# Patient Record
Sex: Male | Born: 1970 | Race: Black or African American | Hispanic: No | Marital: Single | Smoking: Never smoker
Health system: Southern US, Community
[De-identification: ages and names within clinical notes are randomized; demographics above are authoritative.]

## PROBLEM LIST (undated history)

## (undated) DIAGNOSIS — Z8639 Personal history of other endocrine, nutritional and metabolic disease: Secondary | ICD-10-CM

## (undated) DIAGNOSIS — I4891 Unspecified atrial fibrillation: Secondary | ICD-10-CM

## (undated) DIAGNOSIS — M069 Rheumatoid arthritis, unspecified: Secondary | ICD-10-CM

## (undated) DIAGNOSIS — I509 Heart failure, unspecified: Secondary | ICD-10-CM

## (undated) DIAGNOSIS — N189 Chronic kidney disease, unspecified: Secondary | ICD-10-CM

## (undated) DIAGNOSIS — Z91199 Patient's noncompliance with other medical treatment and regimen due to unspecified reason: Secondary | ICD-10-CM

## (undated) DIAGNOSIS — I10 Essential (primary) hypertension: Secondary | ICD-10-CM

## (undated) DIAGNOSIS — D649 Anemia, unspecified: Secondary | ICD-10-CM

## (undated) DIAGNOSIS — Z9119 Patient's noncompliance with other medical treatment and regimen: Secondary | ICD-10-CM

## (undated) DIAGNOSIS — K219 Gastro-esophageal reflux disease without esophagitis: Secondary | ICD-10-CM

## (undated) DIAGNOSIS — I340 Nonrheumatic mitral (valve) insufficiency: Secondary | ICD-10-CM

## (undated) DIAGNOSIS — I5022 Chronic systolic (congestive) heart failure: Secondary | ICD-10-CM

## (undated) DIAGNOSIS — K922 Gastrointestinal hemorrhage, unspecified: Secondary | ICD-10-CM

## (undated) DIAGNOSIS — I472 Ventricular tachycardia: Secondary | ICD-10-CM

## (undated) DIAGNOSIS — I4729 Other ventricular tachycardia: Secondary | ICD-10-CM

## (undated) DIAGNOSIS — I4901 Ventricular fibrillation: Secondary | ICD-10-CM

## (undated) DIAGNOSIS — N179 Acute kidney failure, unspecified: Secondary | ICD-10-CM

## (undated) DIAGNOSIS — R945 Abnormal results of liver function studies: Secondary | ICD-10-CM

## (undated) DIAGNOSIS — Z8674 Personal history of sudden cardiac arrest: Secondary | ICD-10-CM

## (undated) DIAGNOSIS — I071 Rheumatic tricuspid insufficiency: Secondary | ICD-10-CM

## (undated) DIAGNOSIS — R7989 Other specified abnormal findings of blood chemistry: Secondary | ICD-10-CM

## (undated) DIAGNOSIS — Z95810 Presence of automatic (implantable) cardiac defibrillator: Secondary | ICD-10-CM

## (undated) HISTORY — DX: Gastrointestinal hemorrhage, unspecified: K92.2

## (undated) HISTORY — DX: Personal history of sudden cardiac arrest: Z86.74

## (undated) HISTORY — PX: INSERT / REPLACE / REMOVE PACEMAKER: SUR710

## (undated) HISTORY — DX: Personal history of other endocrine, nutritional and metabolic disease: Z86.39

## (undated) HISTORY — DX: Patient's noncompliance with other medical treatment and regimen due to unspecified reason: Z91.199

## (undated) HISTORY — DX: Presence of automatic (implantable) cardiac defibrillator: Z95.810

## (undated) HISTORY — DX: Essential (primary) hypertension: I10

## (undated) HISTORY — PX: HEART TRANSPLANT: SHX268

## (undated) HISTORY — DX: Nonrheumatic mitral (valve) insufficiency: I34.0

## (undated) HISTORY — PX: CHOLECYSTECTOMY: SHX55

## (undated) HISTORY — DX: Rheumatoid arthritis, unspecified: M06.9

## (undated) HISTORY — DX: Unspecified atrial fibrillation: I48.91

## (undated) HISTORY — DX: Other specified abnormal findings of blood chemistry: R79.89

## (undated) HISTORY — DX: Other ventricular tachycardia: I47.29

## (undated) HISTORY — DX: Patient's noncompliance with other medical treatment and regimen: Z91.19

## (undated) HISTORY — DX: Ventricular tachycardia: I47.2

## (undated) HISTORY — DX: Ventricular fibrillation: I49.01

## (undated) HISTORY — DX: Rheumatic tricuspid insufficiency: I07.1

## (undated) HISTORY — DX: Heart failure, unspecified: I50.9

## (undated) HISTORY — DX: Abnormal results of liver function studies: R94.5

## (undated) HISTORY — DX: Chronic systolic (congestive) heart failure: I50.22

---

## 1997-08-18 DIAGNOSIS — Z8674 Personal history of sudden cardiac arrest: Secondary | ICD-10-CM

## 1997-08-18 HISTORY — DX: Personal history of sudden cardiac arrest: Z86.74

## 1997-08-24 ENCOUNTER — Inpatient Hospital Stay (HOSPITAL_COMMUNITY): Admission: EM | Admit: 1997-08-24 | Discharge: 1997-09-06 | Payer: Self-pay | Admitting: Emergency Medicine

## 2000-08-24 ENCOUNTER — Encounter: Payer: Self-pay | Admitting: Internal Medicine

## 2000-08-24 ENCOUNTER — Ambulatory Visit (HOSPITAL_COMMUNITY): Admission: RE | Admit: 2000-08-24 | Discharge: 2000-08-24 | Payer: Self-pay | Admitting: Internal Medicine

## 2004-03-12 ENCOUNTER — Ambulatory Visit: Payer: Self-pay | Admitting: Cardiology

## 2004-06-16 ENCOUNTER — Ambulatory Visit: Payer: Self-pay | Admitting: Internal Medicine

## 2004-06-25 ENCOUNTER — Ambulatory Visit: Payer: Self-pay | Admitting: Internal Medicine

## 2004-07-02 ENCOUNTER — Ambulatory Visit: Payer: Self-pay

## 2004-07-02 ENCOUNTER — Ambulatory Visit: Payer: Self-pay | Admitting: Cardiology

## 2004-07-02 ENCOUNTER — Ambulatory Visit: Payer: Self-pay | Admitting: *Deleted

## 2004-07-04 ENCOUNTER — Ambulatory Visit: Payer: Self-pay | Admitting: Internal Medicine

## 2004-07-09 ENCOUNTER — Ambulatory Visit: Payer: Self-pay | Admitting: Internal Medicine

## 2004-07-09 ENCOUNTER — Ambulatory Visit (HOSPITAL_COMMUNITY): Admission: RE | Admit: 2004-07-09 | Discharge: 2004-07-09 | Payer: Self-pay | Admitting: Internal Medicine

## 2004-07-11 ENCOUNTER — Ambulatory Visit: Payer: Self-pay | Admitting: Cardiology

## 2004-07-23 ENCOUNTER — Ambulatory Visit: Payer: Self-pay | Admitting: Internal Medicine

## 2004-07-23 ENCOUNTER — Ambulatory Visit: Payer: Self-pay

## 2004-08-04 ENCOUNTER — Ambulatory Visit: Payer: Self-pay

## 2004-08-11 ENCOUNTER — Ambulatory Visit: Payer: Self-pay | Admitting: Cardiology

## 2004-08-25 ENCOUNTER — Ambulatory Visit: Payer: Self-pay | Admitting: *Deleted

## 2004-09-11 ENCOUNTER — Ambulatory Visit: Payer: Self-pay | Admitting: Cardiology

## 2004-10-09 ENCOUNTER — Ambulatory Visit: Payer: Self-pay | Admitting: Cardiology

## 2005-01-14 ENCOUNTER — Ambulatory Visit: Payer: Self-pay | Admitting: Cardiology

## 2005-02-12 ENCOUNTER — Ambulatory Visit: Payer: Self-pay | Admitting: Cardiology

## 2005-02-23 ENCOUNTER — Ambulatory Visit: Payer: Self-pay | Admitting: Internal Medicine

## 2005-04-03 ENCOUNTER — Ambulatory Visit: Payer: Self-pay | Admitting: Cardiology

## 2006-03-26 ENCOUNTER — Emergency Department (HOSPITAL_COMMUNITY): Admission: EM | Admit: 2006-03-26 | Discharge: 2006-03-26 | Payer: Self-pay | Admitting: Emergency Medicine

## 2006-04-05 ENCOUNTER — Ambulatory Visit: Payer: Self-pay | Admitting: Cardiology

## 2006-05-10 ENCOUNTER — Ambulatory Visit: Payer: Self-pay | Admitting: Cardiology

## 2006-06-03 ENCOUNTER — Ambulatory Visit: Payer: Self-pay | Admitting: Internal Medicine

## 2006-06-15 ENCOUNTER — Ambulatory Visit: Payer: Self-pay | Admitting: Cardiology

## 2006-06-19 HISTORY — PX: CARDIAC CATHETERIZATION: SHX172

## 2006-06-30 ENCOUNTER — Ambulatory Visit: Payer: Self-pay | Admitting: Cardiology

## 2006-07-17 ENCOUNTER — Inpatient Hospital Stay (HOSPITAL_COMMUNITY): Admission: EM | Admit: 2006-07-17 | Discharge: 2006-07-25 | Payer: Self-pay | Admitting: *Deleted

## 2006-07-19 ENCOUNTER — Encounter: Payer: Self-pay | Admitting: Internal Medicine

## 2006-07-19 ENCOUNTER — Ambulatory Visit: Payer: Self-pay | Admitting: Internal Medicine

## 2006-07-28 ENCOUNTER — Ambulatory Visit: Payer: Self-pay | Admitting: Internal Medicine

## 2006-08-06 ENCOUNTER — Ambulatory Visit: Payer: Self-pay | Admitting: Cardiology

## 2006-08-12 ENCOUNTER — Ambulatory Visit: Payer: Self-pay | Admitting: Cardiology

## 2006-08-16 ENCOUNTER — Ambulatory Visit: Payer: Self-pay | Admitting: Internal Medicine

## 2006-08-19 ENCOUNTER — Ambulatory Visit: Payer: Self-pay | Admitting: Cardiovascular Disease

## 2006-08-20 ENCOUNTER — Ambulatory Visit: Payer: Self-pay

## 2006-08-26 ENCOUNTER — Ambulatory Visit: Payer: Self-pay | Admitting: Cardiology

## 2006-09-17 ENCOUNTER — Ambulatory Visit: Payer: Self-pay | Admitting: Cardiology

## 2006-09-27 ENCOUNTER — Ambulatory Visit: Payer: Self-pay | Admitting: Internal Medicine

## 2006-10-07 ENCOUNTER — Ambulatory Visit: Payer: Self-pay | Admitting: Internal Medicine

## 2006-10-07 LAB — CONVERTED CEMR LAB
CO2: 28 meq/L (ref 19–32)
Calcium: 8.7 mg/dL (ref 8.4–10.5)
Chloride: 109 meq/L (ref 96–112)
Creatinine, Ser: 1.4 mg/dL (ref 0.4–1.5)
Glucose, Bld: 95 mg/dL (ref 70–99)
Sodium: 142 meq/L (ref 135–145)

## 2006-10-25 ENCOUNTER — Ambulatory Visit: Payer: Self-pay | Admitting: Cardiovascular Disease

## 2006-11-22 ENCOUNTER — Ambulatory Visit: Payer: Self-pay | Admitting: Internal Medicine

## 2007-01-03 ENCOUNTER — Ambulatory Visit: Payer: Self-pay | Admitting: Internal Medicine

## 2007-01-03 ENCOUNTER — Ambulatory Visit: Payer: Self-pay

## 2007-01-03 LAB — CONVERTED CEMR LAB
BUN: 13 mg/dL (ref 6–23)
Calcium: 8.8 mg/dL (ref 8.4–10.5)
Creatinine, Ser: 1.1 mg/dL (ref 0.4–1.5)
GFR calc Af Amer: 96 mL/min
GFR calc non Af Amer: 80 mL/min
Glucose, Bld: 104 mg/dL — ABNORMAL HIGH (ref 70–99)
Potassium: 3.5 meq/L (ref 3.5–5.1)
Prothrombin Time: 26.8 s — ABNORMAL HIGH (ref 10.9–13.3)
Sodium: 143 meq/L (ref 135–145)
TSH: 2.21 microintl units/mL (ref 0.35–5.50)

## 2007-01-12 ENCOUNTER — Ambulatory Visit: Payer: Self-pay

## 2007-01-12 ENCOUNTER — Encounter: Payer: Self-pay | Admitting: Internal Medicine

## 2007-01-12 ENCOUNTER — Ambulatory Visit (HOSPITAL_COMMUNITY): Admission: RE | Admit: 2007-01-12 | Discharge: 2007-01-12 | Payer: Self-pay | Admitting: Internal Medicine

## 2007-01-12 ENCOUNTER — Ambulatory Visit: Payer: Self-pay | Admitting: Internal Medicine

## 2007-01-14 ENCOUNTER — Ambulatory Visit: Payer: Self-pay | Admitting: Internal Medicine

## 2007-01-14 ENCOUNTER — Ambulatory Visit: Payer: Self-pay | Admitting: Cardiovascular Disease

## 2007-01-19 ENCOUNTER — Ambulatory Visit: Payer: Self-pay | Admitting: Internal Medicine

## 2007-01-21 ENCOUNTER — Ambulatory Visit: Payer: Self-pay | Admitting: Internal Medicine

## 2007-01-21 ENCOUNTER — Ambulatory Visit (HOSPITAL_COMMUNITY): Admission: RE | Admit: 2007-01-21 | Discharge: 2007-01-21 | Payer: Self-pay | Admitting: Internal Medicine

## 2007-01-27 ENCOUNTER — Ambulatory Visit: Payer: Self-pay | Admitting: Internal Medicine

## 2007-01-27 LAB — CONVERTED CEMR LAB
BUN: 16 mg/dL (ref 6–23)
CO2: 28 meq/L (ref 19–32)
Calcium: 8.6 mg/dL (ref 8.4–10.5)
GFR calc Af Amer: 79 mL/min

## 2007-01-28 ENCOUNTER — Ambulatory Visit: Payer: Self-pay | Admitting: Cardiology

## 2007-01-28 ENCOUNTER — Inpatient Hospital Stay (HOSPITAL_COMMUNITY): Admission: AD | Admit: 2007-01-28 | Discharge: 2007-02-03 | Payer: Self-pay | Admitting: Internal Medicine

## 2007-02-18 ENCOUNTER — Ambulatory Visit: Payer: Self-pay | Admitting: Cardiology

## 2007-03-04 ENCOUNTER — Ambulatory Visit: Payer: Self-pay | Admitting: Cardiology

## 2007-03-10 ENCOUNTER — Ambulatory Visit: Payer: Self-pay | Admitting: Cardiology

## 2007-03-14 ENCOUNTER — Ambulatory Visit: Payer: Self-pay

## 2007-03-16 ENCOUNTER — Ambulatory Visit: Payer: Self-pay | Admitting: Cardiology

## 2007-04-08 ENCOUNTER — Ambulatory Visit: Payer: Self-pay | Admitting: Cardiology

## 2007-04-29 ENCOUNTER — Encounter: Payer: Self-pay | Admitting: Internal Medicine

## 2007-04-29 ENCOUNTER — Ambulatory Visit: Payer: Self-pay | Admitting: Cardiology

## 2007-04-29 ENCOUNTER — Ambulatory Visit: Payer: Self-pay

## 2007-05-02 ENCOUNTER — Ambulatory Visit: Payer: Self-pay | Admitting: Internal Medicine

## 2007-05-02 LAB — CONVERTED CEMR LAB
BUN: 22 mg/dL (ref 6–23)
CO2: 29 meq/L (ref 19–32)
Calcium: 9.5 mg/dL (ref 8.4–10.5)
GFR calc Af Amer: 87 mL/min
GFR calc non Af Amer: 72 mL/min

## 2007-05-27 ENCOUNTER — Ambulatory Visit: Payer: Self-pay | Admitting: Cardiology

## 2007-06-09 ENCOUNTER — Ambulatory Visit: Payer: Self-pay | Admitting: Cardiology

## 2007-07-20 ENCOUNTER — Ambulatory Visit: Payer: Self-pay | Admitting: Internal Medicine

## 2007-08-05 ENCOUNTER — Ambulatory Visit: Payer: Self-pay | Admitting: Internal Medicine

## 2007-08-18 ENCOUNTER — Ambulatory Visit: Payer: Self-pay | Admitting: Cardiology

## 2007-08-25 ENCOUNTER — Ambulatory Visit: Payer: Self-pay

## 2007-11-03 ENCOUNTER — Ambulatory Visit: Payer: Self-pay | Admitting: Cardiovascular Disease

## 2007-11-03 ENCOUNTER — Ambulatory Visit: Payer: Self-pay | Admitting: Internal Medicine

## 2007-11-03 LAB — CONVERTED CEMR LAB
GFR calc Af Amer: 67 mL/min
GFR calc non Af Amer: 55 mL/min
Magnesium: 2.6 mg/dL — ABNORMAL HIGH (ref 1.5–2.5)
Potassium: 4.3 meq/L (ref 3.5–5.1)
Sodium: 135 meq/L (ref 135–145)

## 2007-11-25 ENCOUNTER — Ambulatory Visit: Payer: Self-pay | Admitting: Cardiology

## 2007-12-16 ENCOUNTER — Ambulatory Visit: Payer: Self-pay | Admitting: Cardiology

## 2008-01-16 ENCOUNTER — Ambulatory Visit: Payer: Self-pay | Admitting: Cardiovascular Disease

## 2008-02-06 ENCOUNTER — Ambulatory Visit: Payer: Self-pay

## 2008-02-06 ENCOUNTER — Ambulatory Visit: Payer: Self-pay | Admitting: Internal Medicine

## 2008-03-05 ENCOUNTER — Ambulatory Visit: Payer: Self-pay | Admitting: Cardiology

## 2008-04-02 ENCOUNTER — Ambulatory Visit: Payer: Self-pay | Admitting: Cardiology

## 2008-04-30 ENCOUNTER — Ambulatory Visit: Payer: Self-pay | Admitting: Cardiology

## 2008-05-03 ENCOUNTER — Ambulatory Visit: Payer: Self-pay

## 2008-05-22 ENCOUNTER — Encounter: Payer: Self-pay | Admitting: Internal Medicine

## 2008-05-28 ENCOUNTER — Ambulatory Visit: Payer: Self-pay | Admitting: Cardiology

## 2008-06-11 ENCOUNTER — Ambulatory Visit: Payer: Self-pay | Admitting: Cardiology

## 2008-07-02 ENCOUNTER — Ambulatory Visit: Payer: Self-pay | Admitting: Cardiology

## 2008-07-23 ENCOUNTER — Ambulatory Visit: Payer: Self-pay | Admitting: Cardiology

## 2008-07-26 ENCOUNTER — Ambulatory Visit: Payer: Self-pay

## 2008-08-07 ENCOUNTER — Telehealth: Payer: Self-pay | Admitting: Internal Medicine

## 2008-08-20 ENCOUNTER — Ambulatory Visit: Payer: Self-pay | Admitting: Cardiology

## 2008-09-18 ENCOUNTER — Telehealth (INDEPENDENT_AMBULATORY_CARE_PROVIDER_SITE_OTHER): Payer: Self-pay | Admitting: *Deleted

## 2008-09-18 ENCOUNTER — Ambulatory Visit: Payer: Self-pay | Admitting: Cardiology

## 2008-09-18 LAB — CONVERTED CEMR LAB
POC INR: 2.7
Protime: 19.9

## 2008-09-19 ENCOUNTER — Encounter: Payer: Self-pay | Admitting: *Deleted

## 2008-10-16 ENCOUNTER — Ambulatory Visit: Payer: Self-pay | Admitting: Internal Medicine

## 2008-10-23 ENCOUNTER — Ambulatory Visit: Payer: Self-pay | Admitting: Internal Medicine

## 2008-10-23 DIAGNOSIS — I5022 Chronic systolic (congestive) heart failure: Secondary | ICD-10-CM | POA: Insufficient documentation

## 2008-10-23 DIAGNOSIS — I4891 Unspecified atrial fibrillation: Secondary | ICD-10-CM | POA: Insufficient documentation

## 2008-10-24 ENCOUNTER — Encounter: Payer: Self-pay | Admitting: *Deleted

## 2008-10-26 ENCOUNTER — Telehealth (INDEPENDENT_AMBULATORY_CARE_PROVIDER_SITE_OTHER): Payer: Self-pay | Admitting: *Deleted

## 2008-11-13 ENCOUNTER — Ambulatory Visit: Payer: Self-pay | Admitting: Internal Medicine

## 2008-11-15 ENCOUNTER — Ambulatory Visit: Payer: Self-pay | Admitting: Cardiovascular Disease

## 2008-11-15 LAB — CONVERTED CEMR LAB
BUN: 37 mg/dL — ABNORMAL HIGH (ref 6–23)
Creatinine, Ser: 1.9 mg/dL — ABNORMAL HIGH (ref 0.4–1.5)
GFR calc non Af Amer: 50.65 mL/min (ref >60)
Prothrombin Time: 19.5 s

## 2008-11-21 ENCOUNTER — Ambulatory Visit: Payer: Self-pay | Admitting: Internal Medicine

## 2008-11-21 DIAGNOSIS — I10 Essential (primary) hypertension: Secondary | ICD-10-CM | POA: Insufficient documentation

## 2008-11-22 LAB — CONVERTED CEMR LAB
BUN: 24 mg/dL — ABNORMAL HIGH (ref 6–23)
Chloride: 106 meq/L (ref 96–112)
Creatinine, Ser: 1.5 mg/dL (ref 0.4–1.5)
GFR calc non Af Amer: 66.53 mL/min (ref >60)
Glucose, Bld: 89 mg/dL (ref 70–99)

## 2008-12-13 ENCOUNTER — Ambulatory Visit: Payer: Self-pay | Admitting: Cardiovascular Disease

## 2008-12-13 LAB — CONVERTED CEMR LAB: POC INR: 3.5

## 2009-01-10 ENCOUNTER — Ambulatory Visit: Payer: Self-pay | Admitting: Internal Medicine

## 2009-01-21 ENCOUNTER — Encounter: Payer: Self-pay | Admitting: Internal Medicine

## 2009-01-21 ENCOUNTER — Ambulatory Visit: Payer: Self-pay

## 2009-01-24 ENCOUNTER — Ambulatory Visit: Payer: Self-pay | Admitting: Internal Medicine

## 2009-02-04 ENCOUNTER — Ambulatory Visit: Payer: Self-pay | Admitting: Cardiology

## 2009-02-04 ENCOUNTER — Inpatient Hospital Stay (HOSPITAL_COMMUNITY): Admission: EM | Admit: 2009-02-04 | Discharge: 2009-02-07 | Payer: Self-pay | Admitting: Emergency Medicine

## 2009-02-12 ENCOUNTER — Ambulatory Visit: Payer: Self-pay | Admitting: Cardiovascular Disease

## 2009-02-22 ENCOUNTER — Encounter: Payer: Self-pay | Admitting: Internal Medicine

## 2009-02-25 ENCOUNTER — Ambulatory Visit: Payer: Self-pay | Admitting: Internal Medicine

## 2009-02-25 LAB — CONVERTED CEMR LAB: POC INR: 5.3

## 2009-02-26 ENCOUNTER — Encounter: Payer: Self-pay | Admitting: Internal Medicine

## 2009-02-27 ENCOUNTER — Ambulatory Visit: Payer: Self-pay | Admitting: Internal Medicine

## 2009-03-01 ENCOUNTER — Telehealth (INDEPENDENT_AMBULATORY_CARE_PROVIDER_SITE_OTHER): Payer: Self-pay | Admitting: *Deleted

## 2009-03-06 ENCOUNTER — Telehealth (INDEPENDENT_AMBULATORY_CARE_PROVIDER_SITE_OTHER): Payer: Self-pay | Admitting: *Deleted

## 2009-03-07 ENCOUNTER — Encounter: Payer: Self-pay | Admitting: Internal Medicine

## 2009-03-08 ENCOUNTER — Ambulatory Visit: Payer: Self-pay | Admitting: Cardiology

## 2009-03-13 ENCOUNTER — Encounter: Payer: Self-pay | Admitting: Internal Medicine

## 2009-03-13 ENCOUNTER — Ambulatory Visit: Payer: Self-pay | Admitting: Cardiology

## 2009-03-13 ENCOUNTER — Telehealth (INDEPENDENT_AMBULATORY_CARE_PROVIDER_SITE_OTHER): Payer: Self-pay | Admitting: *Deleted

## 2009-03-13 LAB — CONVERTED CEMR LAB: POC INR: 2.7

## 2009-03-21 ENCOUNTER — Ambulatory Visit: Payer: Self-pay | Admitting: Internal Medicine

## 2009-03-21 ENCOUNTER — Ambulatory Visit: Payer: Self-pay | Admitting: Cardiovascular Disease

## 2009-03-21 LAB — CONVERTED CEMR LAB: POC INR: 2.8

## 2009-03-22 LAB — CONVERTED CEMR LAB
AST: 18 units/L (ref 0–37)
Basophils Relative: 0.8 % (ref 0.0–3.0)
CO2: 32 meq/L (ref 19–32)
Chloride: 104 meq/L (ref 96–112)
Creatinine, Ser: 1.5 mg/dL (ref 0.4–1.5)
Eosinophils Relative: 1.1 % (ref 0.0–5.0)
HCT: 40.8 % (ref 39.0–52.0)
Hemoglobin: 12.8 g/dL — ABNORMAL LOW (ref 13.0–17.0)
MCHC: 31.2 g/dL (ref 30.0–36.0)
MCV: 69.5 fL — ABNORMAL LOW (ref 78.0–100.0)
Neutrophils Relative %: 52.5 % (ref 43.0–77.0)
Potassium: 3.7 meq/L (ref 3.5–5.1)
RBC: 5.88 M/uL — ABNORMAL HIGH (ref 4.22–5.81)
TSH: 2.45 microintl units/mL (ref 0.35–5.50)
Total Bilirubin: 0.7 mg/dL (ref 0.3–1.2)
WBC: 6.6 10*3/uL (ref 4.5–10.5)

## 2009-03-27 ENCOUNTER — Ambulatory Visit (HOSPITAL_COMMUNITY): Admission: RE | Admit: 2009-03-27 | Discharge: 2009-03-27 | Payer: Self-pay | Admitting: Cardiology

## 2009-03-27 ENCOUNTER — Ambulatory Visit: Payer: Self-pay | Admitting: Cardiology

## 2009-04-11 ENCOUNTER — Ambulatory Visit: Payer: Self-pay | Admitting: Internal Medicine

## 2009-04-26 ENCOUNTER — Ambulatory Visit (HOSPITAL_COMMUNITY): Admission: RE | Admit: 2009-04-26 | Discharge: 2009-04-26 | Payer: Self-pay | Admitting: Internal Medicine

## 2009-04-26 ENCOUNTER — Ambulatory Visit: Payer: Self-pay | Admitting: Cardiology

## 2009-04-26 ENCOUNTER — Ambulatory Visit: Payer: Self-pay | Admitting: Cardiovascular Disease

## 2009-04-26 ENCOUNTER — Encounter: Payer: Self-pay | Admitting: Internal Medicine

## 2009-04-26 ENCOUNTER — Ambulatory Visit: Payer: Self-pay

## 2009-04-26 LAB — CONVERTED CEMR LAB: POC INR: 3

## 2009-05-01 ENCOUNTER — Telehealth (INDEPENDENT_AMBULATORY_CARE_PROVIDER_SITE_OTHER): Payer: Self-pay | Admitting: *Deleted

## 2009-05-14 ENCOUNTER — Ambulatory Visit: Payer: Self-pay | Admitting: Cardiovascular Disease

## 2009-05-14 ENCOUNTER — Ambulatory Visit: Payer: Self-pay | Admitting: Internal Medicine

## 2009-05-14 LAB — CONVERTED CEMR LAB: POC INR: 3

## 2009-05-15 ENCOUNTER — Encounter: Payer: Self-pay | Admitting: Internal Medicine

## 2009-05-17 ENCOUNTER — Telehealth: Payer: Self-pay | Admitting: Internal Medicine

## 2009-05-22 ENCOUNTER — Telehealth (INDEPENDENT_AMBULATORY_CARE_PROVIDER_SITE_OTHER): Payer: Self-pay | Admitting: *Deleted

## 2009-05-22 ENCOUNTER — Ambulatory Visit: Payer: Self-pay | Admitting: Internal Medicine

## 2009-05-24 LAB — CONVERTED CEMR LAB
Basophils Relative: 0.2 % (ref 0.0–3.0)
CO2: 27 meq/L (ref 19–32)
Chloride: 107 meq/L (ref 96–112)
Creatinine, Ser: 1.5 mg/dL (ref 0.4–1.5)
Eosinophils Absolute: 0.1 10*3/uL (ref 0.0–0.7)
Eosinophils Relative: 1.8 % (ref 0.0–5.0)
Hemoglobin: 11.2 g/dL — ABNORMAL LOW (ref 13.0–17.0)
INR: 2.6 — ABNORMAL HIGH (ref 0.8–1.0)
Lymphocytes Relative: 18.1 % (ref 12.0–46.0)
MCHC: 30.1 g/dL (ref 30.0–36.0)
MCV: 66.4 fL — ABNORMAL LOW (ref 78.0–100.0)
Monocytes Absolute: 0.6 10*3/uL (ref 0.1–1.0)
Neutro Abs: 5.3 10*3/uL (ref 1.4–7.7)
Neutrophils Relative %: 72.3 % (ref 43.0–77.0)
RBC: 5.61 M/uL (ref 4.22–5.81)
Sodium: 140 meq/L (ref 135–145)
WBC: 7.3 10*3/uL (ref 4.5–10.5)
aPTT: 59.3 s — ABNORMAL HIGH (ref 21.7–28.8)

## 2009-05-27 ENCOUNTER — Ambulatory Visit (HOSPITAL_COMMUNITY): Admission: RE | Admit: 2009-05-27 | Discharge: 2009-05-27 | Payer: Self-pay | Admitting: Internal Medicine

## 2009-05-27 ENCOUNTER — Ambulatory Visit: Payer: Self-pay | Admitting: Internal Medicine

## 2009-06-04 ENCOUNTER — Ambulatory Visit: Payer: Self-pay | Admitting: Internal Medicine

## 2009-06-04 ENCOUNTER — Encounter (INDEPENDENT_AMBULATORY_CARE_PROVIDER_SITE_OTHER): Payer: Self-pay | Admitting: Cardiology

## 2009-06-25 ENCOUNTER — Ambulatory Visit: Payer: Self-pay | Admitting: Cardiology

## 2009-06-25 LAB — CONVERTED CEMR LAB: POC INR: 2.9

## 2009-07-23 ENCOUNTER — Ambulatory Visit: Payer: Self-pay | Admitting: Internal Medicine

## 2009-07-23 LAB — CONVERTED CEMR LAB: POC INR: 1.5

## 2009-08-06 ENCOUNTER — Ambulatory Visit: Payer: Self-pay | Admitting: Cardiovascular Disease

## 2009-08-06 LAB — CONVERTED CEMR LAB: POC INR: 1.9

## 2009-08-27 ENCOUNTER — Ambulatory Visit: Payer: Self-pay | Admitting: Cardiovascular Disease

## 2009-09-06 ENCOUNTER — Telehealth: Payer: Self-pay | Admitting: Internal Medicine

## 2009-09-24 ENCOUNTER — Ambulatory Visit: Payer: Self-pay | Admitting: Cardiology

## 2009-10-23 ENCOUNTER — Ambulatory Visit: Payer: Self-pay | Admitting: Internal Medicine

## 2009-10-23 ENCOUNTER — Ambulatory Visit: Payer: Self-pay

## 2009-10-23 LAB — CONVERTED CEMR LAB: POC INR: 2.6

## 2009-11-20 ENCOUNTER — Ambulatory Visit: Payer: Self-pay | Admitting: Cardiology

## 2009-12-18 ENCOUNTER — Ambulatory Visit: Payer: Self-pay | Admitting: Cardiology

## 2010-01-13 ENCOUNTER — Telehealth: Payer: Self-pay | Admitting: Internal Medicine

## 2010-01-15 ENCOUNTER — Ambulatory Visit: Payer: Self-pay | Admitting: Internal Medicine

## 2010-01-15 LAB — CONVERTED CEMR LAB: POC INR: 1.9

## 2010-02-10 ENCOUNTER — Encounter: Payer: Self-pay | Admitting: Internal Medicine

## 2010-02-11 ENCOUNTER — Ambulatory Visit: Payer: Self-pay | Admitting: Cardiology

## 2010-02-11 ENCOUNTER — Ambulatory Visit: Payer: Self-pay | Admitting: Internal Medicine

## 2010-02-11 LAB — CONVERTED CEMR LAB: POC INR: 2.3

## 2010-02-18 ENCOUNTER — Telehealth (INDEPENDENT_AMBULATORY_CARE_PROVIDER_SITE_OTHER): Payer: Self-pay | Admitting: *Deleted

## 2010-03-11 ENCOUNTER — Ambulatory Visit: Payer: Self-pay | Admitting: Cardiovascular Disease

## 2010-03-11 ENCOUNTER — Telehealth: Payer: Self-pay | Admitting: Internal Medicine

## 2010-03-26 ENCOUNTER — Ambulatory Visit: Payer: Self-pay | Admitting: Internal Medicine

## 2010-04-08 ENCOUNTER — Ambulatory Visit: Payer: Self-pay

## 2010-04-30 ENCOUNTER — Ambulatory Visit: Admission: RE | Admit: 2010-04-30 | Discharge: 2010-04-30 | Payer: Self-pay | Source: Home / Self Care

## 2010-04-30 ENCOUNTER — Ambulatory Visit
Admission: RE | Admit: 2010-04-30 | Discharge: 2010-04-30 | Payer: Self-pay | Source: Home / Self Care | Attending: Internal Medicine | Admitting: Internal Medicine

## 2010-04-30 ENCOUNTER — Other Ambulatory Visit: Payer: Self-pay | Admitting: Internal Medicine

## 2010-05-01 LAB — BASIC METABOLIC PANEL
BUN: 17 mg/dL (ref 6–23)
CO2: 28 mEq/L (ref 19–32)
Calcium: 8.5 mg/dL (ref 8.4–10.5)
Chloride: 103 mEq/L (ref 96–112)
Creatinine, Ser: 1.7 mg/dL — ABNORMAL HIGH (ref 0.4–1.5)
GFR: 56.03 mL/min — ABNORMAL LOW (ref >60.00)
Glucose, Bld: 80 mg/dL (ref 70–99)
Potassium: 3.8 mEq/L (ref 3.5–5.1)
Sodium: 136 mEq/L (ref 135–145)

## 2010-05-14 ENCOUNTER — Inpatient Hospital Stay (HOSPITAL_COMMUNITY)
Admission: EM | Admit: 2010-05-14 | Discharge: 2010-05-22 | DRG: 377 | Disposition: A | Discharge status: Other Acute Inpatient Hospital | Payer: 59 | Attending: Internal Medicine | Admitting: Internal Medicine

## 2010-05-14 DIAGNOSIS — I079 Rheumatic tricuspid valve disease, unspecified: Secondary | ICD-10-CM | POA: Diagnosis present

## 2010-05-14 DIAGNOSIS — I5022 Chronic systolic (congestive) heart failure: Secondary | ICD-10-CM | POA: Diagnosis present

## 2010-05-14 DIAGNOSIS — R578 Other shock: Secondary | ICD-10-CM | POA: Diagnosis present

## 2010-05-14 DIAGNOSIS — N179 Acute kidney failure, unspecified: Secondary | ICD-10-CM | POA: Diagnosis present

## 2010-05-14 DIAGNOSIS — K644 Residual hemorrhoidal skin tags: Secondary | ICD-10-CM | POA: Diagnosis present

## 2010-05-14 DIAGNOSIS — M109 Gout, unspecified: Secondary | ICD-10-CM | POA: Diagnosis present

## 2010-05-14 DIAGNOSIS — N189 Chronic kidney disease, unspecified: Secondary | ICD-10-CM | POA: Diagnosis present

## 2010-05-14 DIAGNOSIS — I059 Rheumatic mitral valve disease, unspecified: Secondary | ICD-10-CM | POA: Diagnosis present

## 2010-05-14 DIAGNOSIS — D62 Acute posthemorrhagic anemia: Secondary | ICD-10-CM

## 2010-05-14 DIAGNOSIS — E875 Hyperkalemia: Secondary | ICD-10-CM | POA: Diagnosis present

## 2010-05-14 DIAGNOSIS — K921 Melena: Principal | ICD-10-CM | POA: Diagnosis present

## 2010-05-14 DIAGNOSIS — I428 Other cardiomyopathies: Secondary | ICD-10-CM | POA: Diagnosis present

## 2010-05-14 DIAGNOSIS — K922 Gastrointestinal hemorrhage, unspecified: Secondary | ICD-10-CM

## 2010-05-14 DIAGNOSIS — T45515A Adverse effect of anticoagulants, initial encounter: Secondary | ICD-10-CM | POA: Diagnosis present

## 2010-05-14 DIAGNOSIS — K5730 Diverticulosis of large intestine without perforation or abscess without bleeding: Secondary | ICD-10-CM | POA: Diagnosis present

## 2010-05-14 DIAGNOSIS — K648 Other hemorrhoids: Secondary | ICD-10-CM | POA: Diagnosis present

## 2010-05-14 DIAGNOSIS — I4891 Unspecified atrial fibrillation: Secondary | ICD-10-CM | POA: Diagnosis present

## 2010-05-14 DIAGNOSIS — Z95810 Presence of automatic (implantable) cardiac defibrillator: Secondary | ICD-10-CM

## 2010-05-14 DIAGNOSIS — I509 Heart failure, unspecified: Secondary | ICD-10-CM | POA: Diagnosis present

## 2010-05-14 DIAGNOSIS — I10 Essential (primary) hypertension: Secondary | ICD-10-CM | POA: Diagnosis present

## 2010-05-14 DIAGNOSIS — R791 Abnormal coagulation profile: Secondary | ICD-10-CM | POA: Diagnosis present

## 2010-05-14 LAB — OCCULT BLOOD, POC DEVICE: Fecal Occult Bld: POSITIVE

## 2010-05-14 LAB — COMPREHENSIVE METABOLIC PANEL
Alkaline Phosphatase: 55 U/L (ref 39–117)
BUN: 37 mg/dL — ABNORMAL HIGH (ref 6–23)
Chloride: 108 mEq/L (ref 96–112)
Creatinine, Ser: 3.27 mg/dL — ABNORMAL HIGH (ref 0.4–1.5)
Glucose, Bld: 113 mg/dL — ABNORMAL HIGH (ref 70–99)
Potassium: 5.3 mEq/L — ABNORMAL HIGH (ref 3.5–5.1)
Total Bilirubin: 0.2 mg/dL — ABNORMAL LOW (ref 0.3–1.2)

## 2010-05-14 LAB — DIFFERENTIAL
Basophils Absolute: 0 10*3/uL (ref 0.0–0.1)
Lymphs Abs: 1.8 10*3/uL (ref 0.7–4.0)
Monocytes Absolute: 1 10*3/uL (ref 0.1–1.0)
Monocytes Relative: 9 % (ref 3–12)
Neutro Abs: 7.9 10*3/uL — ABNORMAL HIGH (ref 1.7–7.7)

## 2010-05-14 LAB — CBC
HCT: 15.8 % — ABNORMAL LOW (ref 39.0–52.0)
Hemoglobin: 4.8 g/dL — CL (ref 13.0–17.0)
MCH: 19.4 pg — ABNORMAL LOW (ref 26.0–34.0)
MCHC: 30.4 g/dL (ref 30.0–36.0)
RDW: 16.9 % — ABNORMAL HIGH (ref 11.5–15.5)

## 2010-05-14 LAB — CK TOTAL AND CKMB (NOT AT ARMC)
CK, MB: 0.8 ng/mL (ref 0.3–4.0)
Total CK: 110 U/L (ref 7–232)

## 2010-05-14 LAB — PROTIME-INR: Prothrombin Time: 25.3 seconds — ABNORMAL HIGH (ref 11.6–15.2)

## 2010-05-15 ENCOUNTER — Encounter: Payer: Self-pay | Admitting: Internal Medicine

## 2010-05-15 LAB — CBC
HCT: 21.8 % — ABNORMAL LOW (ref 39.0–52.0)
HCT: 23.3 % — ABNORMAL LOW (ref 39.0–52.0)
Hemoglobin: 7.2 g/dL — ABNORMAL LOW (ref 13.0–17.0)
Hemoglobin: 7.6 g/dL — ABNORMAL LOW (ref 13.0–17.0)
MCH: 24.9 pg — ABNORMAL LOW (ref 26.0–34.0)
MCH: 25.1 pg — ABNORMAL LOW (ref 26.0–34.0)
MCHC: 33 g/dL (ref 30.0–36.0)
MCHC: 32.6 g/dL (ref 30.0–36.0)
MCV: 75.4 fL — ABNORMAL LOW (ref 78.0–100.0)

## 2010-05-15 LAB — BASIC METABOLIC PANEL
Calcium: 8.1 mg/dL — ABNORMAL LOW (ref 8.4–10.5)
GFR calc non Af Amer: 31 mL/min — ABNORMAL LOW (ref >60)
Glucose, Bld: 120 mg/dL — ABNORMAL HIGH (ref 70–99)
Sodium: 141 mEq/L (ref 135–145)

## 2010-05-15 LAB — PROTIME-INR
INR: 1.69 — ABNORMAL HIGH (ref 0.00–1.49)
Prothrombin Time: 18.9 seconds — ABNORMAL HIGH (ref 11.6–15.2)

## 2010-05-15 LAB — PREPARE RBC (CROSSMATCH)

## 2010-05-15 LAB — CARDIAC PANEL(CRET KIN+CKTOT+MB+TROPI)
Total CK: 94 U/L (ref 7–232)
Troponin I: 0.02 ng/mL (ref 0.00–0.06)

## 2010-05-16 LAB — PREPARE FRESH FROZEN PLASMA
Unit division: 0
Unit division: 0
Unit division: 0
Unit division: 0

## 2010-05-16 LAB — CBC
MCHC: 32.8 g/dL (ref 30.0–36.0)
RDW: 20.4 % — ABNORMAL HIGH (ref 11.5–15.5)

## 2010-05-16 LAB — BASIC METABOLIC PANEL
Calcium: 8.2 mg/dL — ABNORMAL LOW (ref 8.4–10.5)
GFR calc Af Amer: 58 mL/min — ABNORMAL LOW (ref >60)
GFR calc non Af Amer: 48 mL/min — ABNORMAL LOW (ref >60)
Sodium: 137 mEq/L (ref 135–145)

## 2010-05-16 LAB — PROTIME-INR
INR: 1.36 (ref 0.00–1.49)
Prothrombin Time: 17 seconds — ABNORMAL HIGH (ref 11.6–15.2)

## 2010-05-17 LAB — CBC
HCT: 26 % — ABNORMAL LOW (ref 39.0–52.0)
HCT: 25.3 % — ABNORMAL LOW (ref 39.0–52.0)
Hemoglobin: 8.6 g/dL — ABNORMAL LOW (ref 13.0–17.0)
Hemoglobin: 9.8 g/dL — ABNORMAL LOW (ref 13.0–17.0)
MCH: 26.2 pg (ref 26.0–34.0)
MCH: 26.5 pg (ref 26.0–34.0)
MCH: 27.7 pg (ref 26.0–34.0)
MCHC: 33.1 g/dL (ref 30.0–36.0)
MCHC: 34.9 g/dL (ref 30.0–36.0)
MCV: 79.3 fL (ref 78.0–100.0)
MCV: 78.8 fL (ref 78.0–100.0)
MCV: 79.4 fL (ref 78.0–100.0)
Platelets: 336 10*3/uL (ref 150–400)
RBC: 3.54 MIL/uL — ABNORMAL LOW (ref 4.22–5.81)
RDW: 20.6 % — ABNORMAL HIGH (ref 11.5–15.5)
RDW: 20.6 % — ABNORMAL HIGH (ref 11.5–15.5)
WBC: 13.7 10*3/uL — ABNORMAL HIGH (ref 4.0–10.5)

## 2010-05-17 LAB — BASIC METABOLIC PANEL
BUN: 10 mg/dL (ref 6–23)
CO2: 26 mEq/L (ref 19–32)
Calcium: 8.3 mg/dL — ABNORMAL LOW (ref 8.4–10.5)
Glucose, Bld: 87 mg/dL (ref 70–99)
Sodium: 136 mEq/L (ref 135–145)

## 2010-05-17 LAB — PREPARE FRESH FROZEN PLASMA: Unit division: 0

## 2010-05-18 ENCOUNTER — Encounter: Payer: Self-pay | Admitting: Gastroenterology

## 2010-05-18 LAB — CROSSMATCH
ABO/RH(D): O POS
Antibody Screen: NEGATIVE
Unit division: 0
Unit division: 0
Unit division: 0
Unit division: 0
Unit division: 0
Unit division: 0
Unit division: 0

## 2010-05-18 LAB — BASIC METABOLIC PANEL
Calcium: 7.9 mg/dL — ABNORMAL LOW (ref 8.4–10.5)
Chloride: 103 mEq/L (ref 96–112)
Creatinine, Ser: 1.68 mg/dL — ABNORMAL HIGH (ref 0.4–1.5)
GFR calc Af Amer: 55 mL/min — ABNORMAL LOW (ref >60)

## 2010-05-18 LAB — CBC
HCT: 23.1 % — ABNORMAL LOW (ref 39.0–52.0)
HCT: 24.8 % — ABNORMAL LOW (ref 39.0–52.0)
Hemoglobin: 8 g/dL — ABNORMAL LOW (ref 13.0–17.0)
Hemoglobin: 8.6 g/dL — ABNORMAL LOW (ref 13.0–17.0)
Hemoglobin: 9.1 g/dL — ABNORMAL LOW (ref 13.0–17.0)
MCH: 27.4 pg (ref 26.0–34.0)
MCH: 28.1 pg (ref 26.0–34.0)
MCHC: 34.6 g/dL (ref 30.0–36.0)
MCV: 79.1 fL (ref 78.0–100.0)
MCV: 80.9 fL (ref 78.0–100.0)
Platelets: 250 10*3/uL (ref 150–400)
RBC: 3.24 MIL/uL — ABNORMAL LOW (ref 4.22–5.81)
RDW: 19.3 % — ABNORMAL HIGH (ref 11.5–15.5)
RDW: 19.5 % — ABNORMAL HIGH (ref 11.5–15.5)
WBC: 17.1 10*3/uL — ABNORMAL HIGH (ref 4.0–10.5)
WBC: 18.2 10*3/uL — ABNORMAL HIGH (ref 4.0–10.5)

## 2010-05-18 LAB — CONVERTED CEMR LAB
ALT: 32 units/L (ref 0–53)
AST: 33 units/L (ref 0–37)
Alkaline Phosphatase: 65 units/L (ref 39–117)
BUN: 33 mg/dL — ABNORMAL HIGH (ref 6–23)
Bilirubin, Direct: 0 mg/dL (ref 0.0–0.3)
Calcium: 9.3 mg/dL (ref 8.4–10.5)
Chloride: 104 meq/L (ref 96–112)
GFR calc non Af Amer: 48.85 mL/min (ref >60)
GFR calc non Af Amer: 57.6 mL/min (ref >60)
Glucose, Bld: 106 mg/dL — ABNORMAL HIGH (ref 70–99)
Potassium: 4.2 meq/L (ref 3.5–5.1)
Sodium: 139 meq/L (ref 135–145)
Total Bilirubin: 0.4 mg/dL (ref 0.3–1.2)

## 2010-05-18 LAB — BRAIN NATRIURETIC PEPTIDE: Pro B Natriuretic peptide (BNP): 74.2 pg/mL (ref 0.0–100.0)

## 2010-05-19 LAB — PREPARE FRESH FROZEN PLASMA: Unit division: 0

## 2010-05-19 LAB — CBC
HCT: 24.9 % — ABNORMAL LOW (ref 39.0–52.0)
Hemoglobin: 9.7 g/dL — ABNORMAL LOW (ref 13.0–17.0)
MCHC: 33.9 g/dL (ref 30.0–36.0)
MCV: 81.1 fL (ref 78.0–100.0)
Platelets: 290 10*3/uL (ref 150–400)
RDW: 18.8 % — ABNORMAL HIGH (ref 11.5–15.5)
RDW: 18.2 % — ABNORMAL HIGH (ref 11.5–15.5)
WBC: 15.8 10*3/uL — ABNORMAL HIGH (ref 4.0–10.5)

## 2010-05-19 LAB — BASIC METABOLIC PANEL
BUN: 17 mg/dL (ref 6–23)
Chloride: 107 mEq/L (ref 96–112)
Potassium: 4.4 mEq/L (ref 3.5–5.1)
Sodium: 138 mEq/L (ref 135–145)

## 2010-05-19 LAB — BRAIN NATRIURETIC PEPTIDE: Pro B Natriuretic peptide (BNP): 85 pg/mL (ref 0.0–100.0)

## 2010-05-19 NOTE — Consult Note (Signed)
NAME:  [name], [name] NO.:  [id number]  MEDICAL RECORD NO.:  [id number]          PATIENT TYPE:  INP  LOCATION:  1222                         FACILITY:  [facility]  PHYSICIAN:  Dickey Gave, MDDATE OF BIRTH:  1970/09/04  DATE OF CONSULTATION: DATE OF DISCHARGE:                                CONSULTATION   REQUESTING PHYSICIAN:  Jyothi Vennie Homans, MD, [facility].  CONSULTING SURGEON:  Mila Homer. Donne Hazel, M.D.  PRIMARY CARDIOLOGIST:  Deboraha Sprang, MD, [facility]  REASON FOR CONSULTATION:  GI bleed.  HISTORY OF PRESENT ILLNESS:  Mr. Reish is a 40 year old, black male with multiple medical problems.  Currently, he just had an endoscopy and is sedated and unable to give a history.  Therefore, it is obtained from the chart.  Apparently, the patient was in his normal state of health until approximately January 19 or 20 when he developed some vague crampy abdominal pain.  This continued over several days.  Two days prior to admission, the patient has developed melanotic type stools that were described as foul-smelling.  These continued, and the following day, the patient developed lightheadedness and dizziness.  Therefore, he presented to the [facility] Emergency Department where he was found to have a hemoglobin of 4.8 and a systolic blood pressure in the 70s and 80s.  At this time, he was admitted by Critical Care Medicine in hemorrhagic shock.  Gastroenterology was consulted.  An upper endoscopy was performed this afternoon, which was negative.  Currently, upon evaluation of the patient, he is obtunded and sedated secondary to this recent procedure.  He is currently being taken over to nuclear medicine for a nuclear medicine bleeding scan.  Because of the amount of blood the patient has lost, we have been asked to evaluate the patient for possible surgical intervention if all other methods of treatment fail.  REVIEW OF SYSTEMS:  Review of systems is unable to be  obtained for the most part; however, he does state that he does not have any abdominal pain.  The nurses state that he had a 1500 mL bowel movement that was nothing but bright red blood around 11:00 a.m. today.  FAMILY HISTORY:  Unable to obtain.  PAST MEDICAL HISTORY: 1. EF arrest. 2. History of nonischemic cardiomyopathy. 3. Paroxysmal atrial fibrillation on chronic Coumadin. 4. Chronic systolic congestive heart failure with an ejection fraction     of 20-25%. 5. Severe tricuspid regurgitation. 6. Severe mitral regurgitation. 7. Recurrent polymorphic ventricular tachycardia. 8. Hypertension. 9. Chronic renal insufficiency with a base creatinine of 1.7.  PAST SURGICAL HISTORY:  Status post placement of defibrillator. Status post multiple cardioversions.  That is all I can find on the computer.  SOCIAL HISTORY:  The patient is a Economist.  He denies any alcohol or tobacco.  ALLERGIES:  NKDA.  MEDICATIONS AT HOME: 1. Coreg 25 mg p.o. b.i.d. 2. Furosemide 40 mg tablet.  He takes 60 mg daily. 3. Magnesium oxide 400 mg daily. 4. Coumadin as directed. 5. Potassium chloride. 6. Amiodarone 20 mg b.i.d. 7. Spironolactone 25 mg half tablet daily.  PHYSICAL EXAMINATION:  GENERAL:  Mr. Musson  is a 40 year old, well- developed, well-nourished, black male who is currently lying in bed sedated secondary to recent endoscopy. VITAL SIGNS:  Pulse is in the 60s, blood pressure, systolic is between 90 and 100.  Oxygen saturations are 100% on 2 liters. HEENT:  Head is normocephalic, atraumatic.  Sclerae noninjected.  Pupils are equal, round and reactive to light.  Ears and nose without any obvious masses or lesions.  No rhinorrhea.  The patient does have 2 liters nasal cannula going currently.  Mouth is pink. HEART:  Paced and regular rate and rhythm.  Normal S1-S2.  He does have a systolic murmur noted.  Otherwise no gallops or rubs are noted.  He does have palpable carotid rate and  pedal pulses bilaterally.  LUNGS: Clear to auscultation bilaterally with no wheezes, rhonchi or rales. Respiratory effort is nonlabored.  ABDOMEN:  Soft and nontender, nondistended with active bowel sounds.  No obvious masses, hernias or organomegaly are noted. RECTAL:  Exam is deferred at this time given the patient's status as well as he is currently being transported down to Nuclear Medicine. EXTREMITIES:  All four extremities appear symmetrical with no cyanosis, clubbing or edema. PSYCHIATRIC:  Unable to examine at this time.  LABORATORY DATA:  INR is 1.56.  White blood cell count is 7,400.  Most recent hemoglobin is 7.6, hematocrit is 23.3, platelet count is 229,000. Cardiac panels is negative.  Sodium 141, potassium 4.9, glucose 120, BUN 31, creatinine 2.33.  DIAGNOSTIC STUDIES:  Chest x-ray reveals stable cardiomegaly.  Permanent pacemaker with AICD leads is present.  Upper endoscopy revealed no source of bleeding.  IMPRESSION: 1. Gastrointestinal bleed, likely sources lower gastrointestinal bleed     in nature. 2. Acute blood loss anemia. 3. Hemorrhagic shock. 4. Nonischemic cardiomyopathy along with chronic systolic congestive     heart failure with an ejection fraction of 20-25%. 5. Paroxysmal atrial fibrillation on chronic Coumadin. 6. Severe tricuspid and mitral regurgitation. 7. Chronic renal insufficiency.  PLAN:  At this time, we agree with continuing to resuscitate the patient with packed red blood cells as needed.  Given the fact that his INR is 1.56 would proceed with more units of FFP for continued reversal.  Currently, the patient is making his way to Nuclear Medicine for a nuclear medicine type red blood cell bleeding scan.  We agree with this.  If for some reason this is able to find a positive location of bleeding, then we would recommend the patient be taken to Interventional Radiology for possible embolization.  At this time, it would be incredibly  beneficial to have a source and location, so the patient does not require a subtotal colectomy.  If the above- mentioned procedures fail in finding a location, the patient may benefit from a colonoscopy to try to locate the source of bleeding as well.  We will continue to follow the patient along with you.     Henreitta Cea, PA   ______________________________ Dickey Gave, MD    KEO/MEDQ  D:  05/15/2010  T:  05/15/2010  Job:  [id number]  cc:   Deboraha Sprang, MD, [place]. [address]  [address] [place] [address]  Nelwyn Salisbury, MD, [facility] Fax: [phone]  Electronically Signed by Saverio Danker PA on 05/19/2010 01:32:36 PM Electronically Signed by Rolm Bookbinder MD on 05/19/2010 04:27:51 PM

## 2010-05-20 LAB — CBC
HCT: 28.8 % — ABNORMAL LOW (ref 39.0–52.0)
HCT: 27.3 % — ABNORMAL LOW (ref 39.0–52.0)
HCT: 30.9 % — ABNORMAL LOW (ref 39.0–52.0)
Hemoglobin: 9.9 g/dL — ABNORMAL LOW (ref 13.0–17.0)
Hemoglobin: 9.4 g/dL — ABNORMAL LOW (ref 13.0–17.0)
MCH: 28.2 pg (ref 26.0–34.0)
MCH: 28 pg (ref 26.0–34.0)
MCHC: 34.4 g/dL (ref 30.0–36.0)
MCV: 81.5 fL (ref 78.0–100.0)
Platelets: 273 10*3/uL (ref 150–400)
Platelets: 340 10*3/uL (ref 150–400)
RBC: 3.33 MIL/uL — ABNORMAL LOW (ref 4.22–5.81)
RBC: 3.49 MIL/uL — ABNORMAL LOW (ref 4.22–5.81)
RBC: 3.79 MIL/uL — ABNORMAL LOW (ref 4.22–5.81)
RDW: 18.2 % — ABNORMAL HIGH (ref 11.5–15.5)
WBC: 11.9 10*3/uL — ABNORMAL HIGH (ref 4.0–10.5)
WBC: 12.8 10*3/uL — ABNORMAL HIGH (ref 4.0–10.5)
WBC: 12.9 10*3/uL — ABNORMAL HIGH (ref 4.0–10.5)

## 2010-05-20 LAB — PREPARE FRESH FROZEN PLASMA: Unit division: 0

## 2010-05-20 LAB — BASIC METABOLIC PANEL
CO2: 27 mEq/L (ref 19–32)
GFR calc Af Amer: >60 mL/min (ref >60)
GFR calc non Af Amer: >60 mL/min (ref >60)
Glucose, Bld: 93 mg/dL (ref 70–99)
Potassium: 3.9 mEq/L (ref 3.5–5.1)
Sodium: 139 mEq/L (ref 135–145)

## 2010-05-20 NOTE — Progress Notes (Signed)
Summary: med list faxed  Phone Note Call from Patient Call back at Home Phone [phone]   Caller: Patient Reason for Call: Talk to Nurse Summary of Call: a copy of meds faxed to him  [phone]. Initial call taken by: Neil Crouch,  Sep 06, 2009 2:19 PM  Follow-up for Phone Call        I spoke with the pt and confirmed his fax number.  The pt would like a list of his current medications.  Follow-up by: Theodosia Quay, RN, BSN,  Sep 06, 2009 2:23 PM

## 2010-05-20 NOTE — Letter (Signed)
Summary: Handout Printed  Printed Handout:  - Coumadin Instructions-w/out Meds 

## 2010-05-20 NOTE — Medication Information (Signed)
Summary: rov/tm  Anticoagulant Therapy  Managed by: Alinda Deem, PharmD, BCPS, CPP Referring MD: Virl Axe MD PCP: none Supervising MD: Caryl Comes MD, Remo Lipps Indication 1: low ejection fraction (ICD-low EF) Indication 2: Congestive Heart Failure (ICD-428.0) Lab Used: [place] Site: [facility] INR POC 2.3 INR RANGE 2 - 3  Dietary changes: no    Health status changes: no    Bleeding/hemorrhagic complications: no    Recent/future hospitalizations: no    Any changes in medication regimen? no    Recent/future dental: no  Any missed doses?: no       Is patient compliant with meds? yes       Current Medications (verified): 1)  Carvedilol 25 Mg Tabs (Carvedilol) .... Take 1 Tablet By Mouth Twice A Day 2)  Furosemide 40 Mg Tabs (Furosemide) .... Take 1 1/2 Tablet By Mouth Two Times A Day 3)  Magnesium Oxide 400 Mg Tabs (Magnesium Oxide) 4)  Warfarin Sodium 5 Mg Tabs (Warfarin Sodium) .... Use As Directed By Anticoagulation Clinic 5)  Lisinopril 40 Mg Tabs (Lisinopril) .... Take One Tablet By Mouth Daily 6)  Potassium Chloride Crys Cr 20 Meq Cr-Tabs (Potassium Chloride Crys Cr) .... Take 1/2 Tablet Daily. 7)  Amiodarone Hcl 200 Mg Tabs (Amiodarone Hcl) .... Take Two Tablet By Mouth Once Daily 8)  Spironolactone 25 Mg Tabs (Spironolactone) .... Take 0.5  Tablet By Mouth Daily  Allergies (verified): No Known Drug Allergies  Anticoagulation Management History:      The patient is taking warfarin and comes in today for a routine follow up visit.  Positive risk factors for bleeding include presence of serious comorbidities.  Negative risk factors for bleeding include an age less than 53 years old.  The bleeding index is 'intermediate risk'.  Positive CHADS2 values include History of CHF and History of HTN.  Negative CHADS2 values include Age > 28 years old.  The start date was 04/20/2000.  His last INR was 2.6 ratio.  Anticoagulation responsible provider: Caryl Comes MD, Remo Lipps.  INR POC:  2.3.  Cuvette Lot#: [id number].  Exp: 07/2010.    Anticoagulation Management Assessment/Plan:      The patient's current anticoagulation dose is Warfarin sodium 5 mg tabs: Use as directed by Anticoagulation Clinic.  The target INR is 2.0-3.0.  The next INR is due 06/25/2009.  Anticoagulation instructions were given to patient.  Results were reviewed/authorized by Alinda Deem, PharmD, BCPS, CPP.  He was notified by Alinda Deem PharmD, BCPS, CPP.         Prior Anticoagulation Instructions: INR 3.0 Pt. will incorporate more leafy green vegetables in her diet. Continue 2.5mg s daily except 5mg s on Mondays and Fridays. Recheck in 3 weeks.  Current Anticoagulation Instructions: INR 2.3  Continue 1 tab on Monday and Friday and 0.5 tab on all other days.  Recheck in 4 weeks.

## 2010-05-20 NOTE — Medication Information (Signed)
Summary: rov/sp  Anticoagulant Therapy  Managed by: Porfirio Oar, PharmD Referring MD: Virl Axe MD PCP: none Supervising MD: Lovena Le MD, Carleene Overlie Indication 1: low ejection fraction (ICD-low EF) Indication 2: Congestive Heart Failure (ICD-428.0) Lab Used: [place] Site: [facility] INR POC 1.9 INR RANGE 2 - 3  Dietary changes: yes       Details: extra greens this week  Health status changes: no    Bleeding/hemorrhagic complications: no    Recent/future hospitalizations: no    Any changes in medication regimen? no    Recent/future dental: no  Any missed doses?: no       Is patient compliant with meds? yes       Allergies: No Known Drug Allergies  Anticoagulation Management History:      The patient is taking warfarin and comes in today for a routine follow up visit.  Positive risk factors for bleeding include presence of serious comorbidities.  Negative risk factors for bleeding include an age less than 1 years old.  The bleeding index is 'intermediate risk'.  Positive CHADS2 values include History of CHF and History of HTN.  Negative CHADS2 values include Age > 25 years old.  The start date was 04/20/2000.  His last INR was 2.6 ratio.  Anticoagulation responsible provider: Lovena Le MD, Carleene Overlie.  INR POC: 1.9.  Cuvette Lot#: [id number].  Exp: 02/2011.    Anticoagulation Management Assessment/Plan:      The patient's current anticoagulation dose is Warfarin sodium 5 mg tabs: Use as directed by Anticoagulation Clinic.  The target INR is 2.0-3.0.  The next INR is due 02/12/2010.  Anticoagulation instructions were given to patient.  Results were reviewed/authorized by Porfirio Oar, PharmD.  He was notified by Porfirio Oar PharmD.         Prior Anticoagulation Instructions: INR 2.6  Continue same dose of 1/2 tablet every day except 1 tablet on Monday and Friday.  Recheck INR in 4 weeks.   Current Anticoagulation Instructions: INR 1.9  Take extra 1/2 tablet today then resume same  dose of 1/2 tablet every day except 1 tablet on Monday and Friday. Recheck INR in 4 weeks

## 2010-05-20 NOTE — Cardiovascular Report (Signed)
Summary: Office Visit   Office Visit   Imported By: Sallee Provencal 02/20/2010 16:16:36  _____________________________________________________________________  External Attachment:    Type:   Image     Comment:   External Document

## 2010-05-20 NOTE — Progress Notes (Signed)
Summary: cpx instructions  Phone Note Outgoing Call   Call placed by: Kevan Rosebush, RN,  May 17, 2009 5:35 PM Summary of Call: set pt up for CPX on 2/22 at 1pm, called pt and gave him instrucitons, he verbalized understanding

## 2010-05-20 NOTE — Medication Information (Signed)
Summary: rov/tm  Anticoagulant Therapy  Managed by: Freddrick March, RN, BSN Referring MD: Virl Axe MD PCP: none Supervising MD: Burt Knack MD, Legrand Como Indication 1: low ejection fraction (ICD-low EF) Indication 2: Congestive Heart Failure (ICD-428.0) Lab Used: [place] Site: [facility] INR POC 1.9 INR RANGE 2 - 3  Dietary changes: no    Health status changes: no    Bleeding/hemorrhagic complications: no    Recent/future hospitalizations: no    Any changes in medication regimen? no    Recent/future dental: no  Any missed doses?: no       Is patient compliant with meds? yes       Allergies (verified): No Known Drug Allergies  Anticoagulation Management History:      The patient is taking warfarin and comes in today for a routine follow up visit.  Positive risk factors for bleeding include presence of serious comorbidities.  Negative risk factors for bleeding include an age less than 62 years old.  The bleeding index is 'intermediate risk'.  Positive CHADS2 values include History of CHF and History of HTN.  Negative CHADS2 values include Age > 32 years old.  The start date was 04/20/2000.  His last INR was 2.6 ratio.  Anticoagulation responsible provider: Burt Knack MD, Legrand Como.  INR POC: 1.9.  Cuvette Lot#: [id number].  Exp: 09/2010.    Anticoagulation Management Assessment/Plan:      The patient's current anticoagulation dose is Warfarin sodium 5 mg tabs: Use as directed by Anticoagulation Clinic.  The target INR is 2.0-3.0.  The next INR is due 08/27/2009.  Anticoagulation instructions were given to patient.  Results were reviewed/authorized by Freddrick March, RN, BSN.  He was notified by Freddrick March RN.         Prior Anticoagulation Instructions: INR 1.5 Today 5mg s then resume 2.5mg s daily except 5mg s on Mondays and  Fridays. Recheck in 2 weeks.   Current Anticoagulation Instructions: INR 1.9  Take 1 tablet today then resume same dosage 1/2 tablet daily except 1 tablet  on Mondays and Fridays.  Recheck in 3 weeks.

## 2010-05-20 NOTE — Procedures (Signed)
Summary: device check   Current Medications (verified): 1)  Carvedilol 25 Mg Tabs (Carvedilol) .... Take 1 Tablet By Mouth Twice A Day 2)  Furosemide 40 Mg Tabs (Furosemide) .... Take 1 1/2 Tablet By Mouth Two Times A Day 3)  Magnesium Oxide 400 Mg Tabs (Magnesium Oxide) .... One By Mouth Daily 4)  Warfarin Sodium 5 Mg Tabs (Warfarin Sodium) .... Use As Directed By Anticoagulation Clinic 5)  Lisinopril 40 Mg Tabs (Lisinopril) .... Take One Tablet By Mouth Daily 6)  Potassium Chloride Crys Cr 20 Meq Cr-Tabs (Potassium Chloride Crys Cr) .... Take 1/2 Tablet Daily. 7)  Amiodarone Hcl 200 Mg Tabs (Amiodarone Hcl) .... Take Two Tablet By Mouth Once Daily 8)  Spironolactone 25 Mg Tabs (Spironolactone) .... Take 0.5  Tablet By Mouth Daily  Allergies (verified): No Known Drug Allergies   ICD Specifications Following MD:  Virl Axe, MD     ICD Vendor:  [employer]     ICD Model Number:  T180     ICD Serial Number:  [id number] ICD DOI:  07/09/2004     ICD Implanting MD:  Virl Axe, MD  Lead 1:    Location: RV     DOI: 09/03/1997     Model #: [id number]     Serial #: [id number]     Status: active  Indications::  SCD   ICD Follow Up Remote Check?  No Battery Voltage:  2.58 V     Charge Time:  14.1 seconds     Battery Est. Longevity:  MOL2 ICD Dependent:  No       ICD Device Measurements Right Ventricle:  Amplitude: 18.5 mV, Impedance: 697 ohms, Threshold: 0.8 V at 0.4 msec Shock Impedance: 43 ohms   Episodes Coumadin:  Yes Shock:  0     ATP:  0     Nonsustained:  0     Ventricular Pacing:  21%  Brady Parameters Mode VVI     Lower Rate Limit:  40      Tachy Zones VF:  200     Next Cardiology Appt Due:  01/18/2010 Tech Comments:  No parameter changes.  Device function normal.  ROV 3 months with Dr. Caryl Comes. Alma Friendly, LPN  July  6, 624THL [id number] PM

## 2010-05-20 NOTE — Medication Information (Signed)
Summary: rov/sp  Anticoagulant Therapy  Managed by: Porfirio Oar, PharmD Referring MD: Virl Axe MD PCP: none Supervising MD: Angelena Form MD, Harrell Gave Indication 1: low ejection fraction (ICD-low EF) Indication 2: Congestive Heart Failure (ICD-428.0) Lab Used: [place] Site: [facility] INR POC 2.0 INR RANGE 2 - 3  Dietary changes: no    Health status changes: no    Bleeding/hemorrhagic complications: no    Recent/future hospitalizations: no    Any changes in medication regimen? no    Recent/future dental: no  Any missed doses?: no       Is patient compliant with meds? yes       Allergies: No Known Drug Allergies  Anticoagulation Management History:      The patient is taking warfarin and comes in today for a routine follow up visit.  Positive risk factors for bleeding include presence of serious comorbidities.  Negative risk factors for bleeding include an age less than 74 years old.  The bleeding index is 'intermediate risk'.  Positive CHADS2 values include History of CHF and History of HTN.  Negative CHADS2 values include Age > 100 years old.  The start date was 04/20/2000.  His last INR was 2.6 ratio.  Anticoagulation responsible provider: Angelena Form MD, Harrell Gave.  INR POC: 2.0.  Cuvette Lot#: [id number].  Exp: 03/2011.    Anticoagulation Management Assessment/Plan:      The patient's current anticoagulation dose is Warfarin sodium 5 mg tabs: Use as directed by Anticoagulation Clinic.  The target INR is 2.0-3.0.  The next INR is due 04/08/2010.  Anticoagulation instructions were given to patient.  Results were reviewed/authorized by Porfirio Oar, PharmD.  He was notified by Porfirio Oar PharmD.         Prior Anticoagulation Instructions: INR 2.3  Continue same dose of 1/2 tablet every day except 1 tablet on Monday and Friday.  Recheck INR in 4 weeks.   Current Anticoagulation Instructions: INR 2.0  Continue same dose of 1/2 tablet every day except 1 tablet on Monday  and Friday.  Recheck INR in 4 weeks.

## 2010-05-20 NOTE — Medication Information (Signed)
Summary: rov/ewj  Anticoagulant Therapy  Managed by: Tula Nakayama, RN, BSN Referring MD: Virl Axe MD PCP: none Supervising MD: Percival Spanish MD, Jeneen Rinks Indication 1: low ejection fraction (ICD-low EF) Indication 2: Congestive Heart Failure (ICD-428.0) Lab Used: [facility] Site: [place] INR POC 3.0 INR RANGE 2 - 3  Dietary changes: no    Health status changes: no    Bleeding/hemorrhagic complications: no    Recent/future hospitalizations: no    Any changes in medication regimen? no    Recent/future dental: no  Any missed doses?: no       Is patient compliant with meds? yes      Comments: Seeing Dr Caryl Comes today.  Allergies: No Known Drug Allergies  Anticoagulation Management History:      The patient is taking warfarin and comes in today for a routine follow up visit.  Positive risk factors for bleeding include presence of serious comorbidities.  Negative risk factors for bleeding include an age less than 7 years old.  The bleeding index is 'intermediate risk'.  Positive CHADS2 values include History of CHF and History of HTN.  Negative CHADS2 values include Age > 88 years old.  The start date was 04/20/2000.  His last INR was 2.8 ratio.  Anticoagulation responsible provider: Percival Spanish MD, Jeneen Rinks.  INR POC: 3.0.  Cuvette Lot#: [id number].  Exp: 07/2010.    Anticoagulation Management Assessment/Plan:      The patient's current anticoagulation dose is Warfarin sodium 5 mg tabs: Use as directed by Anticoagulation Clinic.  The target INR is 2.0-3.0.  The next INR is due 06/04/2009.  Anticoagulation instructions were given to patient.  Results were reviewed/authorized by Tula Nakayama, RN, BSN.  He was notified by Tula Nakayama, RN, BSN.         Prior Anticoagulation Instructions: INR 3.0  Start taking 1/2 tablet daily except 1 tablet on Mondays and Fridays.  Recheck in 3 weeks.    Current Anticoagulation Instructions: INR 3.0 Pt. will incorporate more leafy green vegetables in  her diet. Continue 2.5mg s daily except 5mg s on Mondays and Fridays. Recheck in 3 weeks.

## 2010-05-20 NOTE — Medication Information (Signed)
Summary: rov.mp  Anticoagulant Therapy  Managed by: Freddrick March, RN, BSN Referring MD: Virl Axe MD PCP: none Supervising MD: Ron Parker MD, Dellis Filbert Indication 1: low ejection fraction (ICD-low EF) Indication 2: Congestive Heart Failure (ICD-428.0) Lab Used: [place] Site: [facility] INR POC 2.9 INR RANGE 2 - 3  Dietary changes: no    Health status changes: no    Bleeding/hemorrhagic complications: yes       Details: 1 episode of epitaxsis, controlled, none further.  Recent/future hospitalizations: no    Any changes in medication regimen? no    Recent/future dental: no  Any missed doses?: no       Is patient compliant with meds? yes       Allergies (verified): No Known Drug Allergies  Anticoagulation Management History:      The patient is taking warfarin and comes in today for a routine follow up visit.  Positive risk factors for bleeding include presence of serious comorbidities.  Negative risk factors for bleeding include an age less than 64 years old.  The bleeding index is 'intermediate risk'.  Positive CHADS2 values include History of CHF and History of HTN.  Negative CHADS2 values include Age > 51 years old.  The start date was 04/20/2000.  His last INR was 2.6 ratio.  Anticoagulation responsible provider: Ron Parker MD, Dellis Filbert.  INR POC: 2.9.  Cuvette Lot#: [id number].  Exp: 08/2010.    Anticoagulation Management Assessment/Plan:      The patient's current anticoagulation dose is Warfarin sodium 5 mg tabs: Use as directed by Anticoagulation Clinic.  The target INR is 2.0-3.0.  The next INR is due 07/23/2009.  Anticoagulation instructions were given to patient.  Results were reviewed/authorized by Freddrick March, RN, BSN.  He was notified by Freddrick March RN.         Prior Anticoagulation Instructions: INR 2.3  Continue 1 tab on Monday and Friday and 0.5 tab on all other days.  Recheck in 4 weeks.    Current Anticoagulation Instructions: INR 2.9  Continue on same  dosage 1/2 tablet daily except 1 tablet on Mondays and Fridays.  Recheck in 4 weeks.

## 2010-05-20 NOTE — Medication Information (Signed)
Summary: rov/sp  Anticoagulant Therapy  Managed by: Porfirio Oar, PharmD Referring MD: Virl Axe MD PCP: none Supervising MD: Ron Parker MD, Dellis Filbert Indication 1: low ejection fraction (ICD-low EF) Indication 2: Congestive Heart Failure (ICD-428.0) Lab Used: [address] Site: [facility] INR POC 2.6 INR RANGE 2 - 3  Dietary changes: no    Health status changes: no    Bleeding/hemorrhagic complications: no    Recent/future hospitalizations: no    Any changes in medication regimen? no    Recent/future dental: no  Any missed doses?: no       Is patient compliant with meds? yes       Allergies: No Known Drug Allergies  Anticoagulation Management History:      The patient is taking warfarin and comes in today for a routine follow up visit.  Positive risk factors for bleeding include presence of serious comorbidities.  Negative risk factors for bleeding include an age less than 42 years old.  The bleeding index is 'intermediate risk'.  Positive CHADS2 values include History of CHF and History of HTN.  Negative CHADS2 values include Age > 42 years old.  The start date was 04/20/2000.  His last INR was 2.6 ratio.  Anticoagulation responsible provider: Ron Parker MD, Dellis Filbert.  INR POC: 2.6.  Cuvette Lot#: [id number].  Exp: 01/2011.    Anticoagulation Management Assessment/Plan:      The patient's current anticoagulation dose is Warfarin sodium 5 mg tabs: Use as directed by Anticoagulation Clinic.  The target INR is 2.0-3.0.  The next INR is due 01/15/2010.  Anticoagulation instructions were given to patient.  Results were reviewed/authorized by Porfirio Oar, PharmD.  He was notified by Porfirio Oar PharmD.         Prior Anticoagulation Instructions: INR 2.3  Continue same dose of 1/2 tablet every day except 1 tablet on Monday and Friday.   Current Anticoagulation Instructions: INR 2.6  Continue same dose of 1/2 tablet every day except 1 tablet on Monday and Friday.  Recheck INR in 4 weeks.
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
## 2010-05-20 NOTE — Medication Information (Signed)
Summary: rov/sp  Anticoagulant Therapy  Managed by: Porfirio Oar, PharmD Referring MD: Virl Axe MD PCP: none Supervising MD: Aundra Dubin MD, Kirk Ruths Indication 1: low ejection fraction (ICD-low EF) Indication 2: Congestive Heart Failure (ICD-428.0) Lab Used: [place] Site: [facility] INR POC 2.3 INR RANGE 2 - 3  Dietary changes: no    Health status changes: no    Bleeding/hemorrhagic complications: no    Recent/future hospitalizations: no    Any changes in medication regimen? no    Recent/future dental: no  Any missed doses?: no       Is patient compliant with meds? yes       Allergies: No Known Drug Allergies  Anticoagulation Management History:      The patient is taking warfarin and comes in today for a routine follow up visit.  Positive risk factors for bleeding include presence of serious comorbidities.  Negative risk factors for bleeding include an age less than 43 years old.  The bleeding index is 'intermediate risk'.  Positive CHADS2 values include History of CHF and History of HTN.  Negative CHADS2 values include Age > 68 years old.  The start date was 04/20/2000.  His last INR was 2.6 ratio.  Anticoagulation responsible provider: Aundra Dubin MD, Tamaira Ciriello.  INR POC: 2.3.  Cuvette Lot#: [id number].  Exp: 02/2011.    Anticoagulation Management Assessment/Plan:      The patient's current anticoagulation dose is Warfarin sodium 5 mg tabs: Use as directed by Anticoagulation Clinic.  The target INR is 2.0-3.0.  The next INR is due 03/11/2010.  Anticoagulation instructions were given to patient.  Results were reviewed/authorized by Porfirio Oar, PharmD.  He was notified by Porfirio Oar PharmD.         Prior Anticoagulation Instructions: INR 1.9  Take extra 1/2 tablet today then resume same dose of 1/2 tablet every day except 1 tablet on Monday and Friday. Recheck INR in 4 weeks  Current Anticoagulation Instructions: INR 2.3  Continue same dose of 1/2 tablet every day except 1  tablet on Monday and Friday.  Recheck INR in 4 weeks.

## 2010-05-20 NOTE — Miscellaneous (Signed)
Summary: Orders Update  Clinical Lists Changes  Orders: Added new Test order of T-2 View CXR ([id number]) - Signed

## 2010-05-20 NOTE — Progress Notes (Signed)
  Phone Note Outgoing Call   Call placed by: Lelania Bia Call placed to: Patient Details for Reason: need to schedule bmet in 2 weeks Summary of Call: left message to have pt decrease lisinopril to 20 from 40mg . also need to recheck bmet in 2weeks if cr is >2 will do a renal referall at that time. await pt c/b to schedule labs.  Initial call taken by: Joan Flores RN,  February 18, 2010 11:10 AM  Follow-up for Phone Call        03/07/10 [phone] left msg for c/b Joan Flores, RN, BSN pt in for coumadin and scheduled labs and he is to adjust Lisinopril.  Follow-up by: Joan Flores RN,  March 11, 2010 3:14 PM

## 2010-05-20 NOTE — Medication Information (Signed)
Summary: rov/tm  Anticoagulant Therapy  Managed by: Tula Nakayama, RN, BSN Referring MD: Virl Axe MD PCP: none Supervising MD: Verl Blalock MD, Marcello Moores Indication 1: low ejection fraction (ICD-low EF) Indication 2: Congestive Heart Failure (ICD-428.0) Lab Used: [place] Site: [place] INR POC 3.0 INR RANGE 2 - 3  Dietary changes: no    Health status changes: yes       Details: Gout in Rt wrist  Bleeding/hemorrhagic complications: no    Recent/future hospitalizations: no    Any changes in medication regimen? yes       Details: Hydrocodone PRN and Celebrex PRN  Recent/future dental: no  Any missed doses?: no       Is patient compliant with meds? yes       Allergies: No Known Drug Allergies  Anticoagulation Management History:      The patient is taking warfarin and comes in today for a routine follow up visit.  Positive risk factors for bleeding include presence of serious comorbidities.  Negative risk factors for bleeding include an age less than 46 years old.  The bleeding index is 'intermediate risk'.  Positive CHADS2 values include History of CHF and History of HTN.  Negative CHADS2 values include Age > 3 years old.  The start date was 04/20/2000.  His last INR was 2.6 ratio.  Anticoagulation responsible provider: Verl Blalock MD, Marcello Moores.  INR POC: 3.0.  Cuvette Lot#: [id number].  Exp: 11/2010.    Anticoagulation Management Assessment/Plan:      The patient's current anticoagulation dose is Warfarin sodium 5 mg tabs: Use as directed by Anticoagulation Clinic.  The target INR is 2.0-3.0.  The next INR is due 10/23/2009.  Anticoagulation instructions were given to patient.  Results were reviewed/authorized by Tula Nakayama, RN, BSN.  He was notified by Tula Nakayama, RN, BSN.         Prior Anticoagulation Instructions: INR 2.2 Continue 2.5mg s daily except 5mg s on Mondays and Fridays. Recheck in 4 weeks.   Current Anticoagulation Instructions: INR 3.0 Skip Wednesday's dose then  resume 2.5mg s everyday except 5mg  on Mondays and Fridays. Recheck in 4 weeks.

## 2010-05-20 NOTE — Progress Notes (Signed)
Summary: pt rtn your call  Phone Note Call from Patient Call back at Home Phone [phone]   Caller: Patient Reason for Call: Talk to Nurse, Talk to Doctor Summary of Call: rtn your call Initial call taken by: Shelda Pal,  March 11, 2010 8:48 AM  Follow-up for Phone Call        pt in today and scheduled labs for 12/7 and he will decrease Lisinopril.  Follow-up by: Joan Flores RN,  March 11, 2010 3:13 PM

## 2010-05-20 NOTE — Progress Notes (Signed)
  Phone Note Outgoing Call   Call placed by: Barnett Abu, RN, BSN,  May 01, 2009 5:45 PM Call placed to: Patient Summary of Call: To discuss ECHO results. Unable to reach pt. His VM will not accept anymore messages. Will try back. Barnett Abu, RN, BSN  May 01, 2009 5:45 PM   Follow-up for Phone Call        S/W pt and he is aware of ECHO results and will discuss it in more detail with Dr. Caryl Comes at his upcoming appt. Follow-up by: Barnett Abu, RN, BSN,  May 02, 2009 4:00 PM

## 2010-05-20 NOTE — Progress Notes (Signed)
Summary: rx amiodarone   Phone Note Refill Request Call back at Home Phone [phone] Message from:  Patient on January 13, 2010 12:23 PM  Refills Requested: Medication #1:  AMIODARONE HCL 200 MG TABS Take two tablet by mouth once daily [facility] on [address]   Method Requested: Fax to [facility] Initial call taken by: Neil Crouch,  January 13, 2010 12:23 PM    Prescriptions: AMIODARONE HCL 200 MG TABS (AMIODARONE HCL) Take two tablet by mouth once daily  #60 x 11   Entered by:   Julaine Hua, CMA   Authorized by:   Nikki Dom, MD, [facility]   Signed by:   Julaine Hua, CMA on 01/13/2010   Method used:   Electronically to        [facility] [phone]* (retail)       [address]       [place], [place]  [address]       Ph: [phone]       Fax: [phone]   RxIDTE:[id number]

## 2010-05-20 NOTE — Medication Information (Signed)
Summary: rov/tm  Anticoagulant Therapy  Managed by: Porfirio Oar, PharmD Referring MD: Virl Axe MD PCP: none Supervising MD: Rayann Heman MD, Jeneen Rinks Indication 1: low ejection fraction (ICD-low EF) Indication 2: Congestive Heart Failure (ICD-428.0) Lab Used: [place] Site: [facility] INR POC 2.6 INR RANGE 2 - 3  Dietary changes: no    Health status changes: no    Bleeding/hemorrhagic complications: no    Recent/future hospitalizations: no    Any changes in medication regimen? no    Recent/future dental: no  Any missed doses?: no       Is patient compliant with meds? yes       Allergies: No Known Drug Allergies  Anticoagulation Management History:      The patient is taking warfarin and comes in today for a routine follow up visit.  Positive risk factors for bleeding include presence of serious comorbidities.  Negative risk factors for bleeding include an age less than 56 years old.  The bleeding index is 'intermediate risk'.  Positive CHADS2 values include History of CHF and History of HTN.  Negative CHADS2 values include Age > 4 years old.  The start date was 04/20/2000.  His last INR was 2.6 ratio.  Anticoagulation responsible provider: Lynae Pederson MD, Jeneen Rinks.  INR POC: 2.6.  Cuvette Lot#: [id number].  Exp: 12/2010.    Anticoagulation Management Assessment/Plan:      The patient's current anticoagulation dose is Warfarin sodium 5 mg tabs: Use as directed by Anticoagulation Clinic.  The target INR is 2.0-3.0.  The next INR is due 11/20/2009.  Anticoagulation instructions were given to patient.  Results were reviewed/authorized by Porfirio Oar, PharmD.  He was notified by Porfirio Oar PharmD.         Prior Anticoagulation Instructions: INR 3.0 Skip Wednesday's dose then resume 2.5mg s everyday except 5mg  on Mondays and Fridays. Recheck in 4 weeks.   Current Anticoagulation Instructions: INR 2.6  Continue same dose of 1/2 tablet every day except 1 tablet on Monday and Friday.

## 2010-05-20 NOTE — Medication Information (Signed)
Summary: rov/ewj  Anticoagulant Therapy  Managed by: [name] Nakayama, RN, BSN Referring MD: [name] Axe MD PCP: none Supervising MD: [name] Comes MD, [name] Indication 1: low ejection fraction (ICD-low EF) Indication 2: Congestive Heart Failure (ICD-428.0) Lab Used: [place] Site: [facility] INR POC 1.5 INR RANGE 2 - 3           Allergies: No Known Drug Allergies  Anticoagulation Management History:      The patient is taking warfarin and [name] in today for a routine follow up visit.  Positive risk factors for bleeding include presence of serious comorbidities.  Negative risk factors for bleeding include an age less than 42 years old.  The bleeding index is 'intermediate risk'.  Positive CHADS2 values include History of CHF and History of HTN.  Negative CHADS2 values include Age > 42 years old.  The start date was 04/20/2000.  His last INR was 2.6 ratio.  Anticoagulation responsible provider: Caryl Comes MD, [name].  INR POC: 1.5.  Cuvette Lot#: [id number].  Exp: 08/2010.    Anticoagulation Management Assessment/Plan:      The patient's current anticoagulation dose is Warfarin sodium 5 mg tabs: Use as directed by Anticoagulation Clinic.  The target INR is 2.0-3.0.  The next INR is due 08/06/2009.  Anticoagulation instructions were given to patient.  Results were reviewed/authorized by [name] Nakayama, RN, BSN.  He was notified by [name] Nakayama, RN, BSN.         Prior Anticoagulation Instructions: INR 2.9  Continue on same dosage 1/2 tablet daily except 1 tablet on Mondays and Fridays.  Recheck in 4 weeks.    Current Anticoagulation Instructions: INR 1.5 Today 5mg s then resume 2.5mg s daily except 5mg s on Mondays and  Fridays. Recheck in 2 weeks.   Appended Document: Coumadin Clinic    Anticoagulant Therapy  Managed by: [name] Nakayama, RN, BSN Referring MD: [name] Axe MD PCP: none Supervising MD: [name] Comes MD, [name] Indication 1: low ejection fraction (ICD-low EF) Indication 2:  Congestive Heart Failure (ICD-428.0) Lab Used: [facility] Site: [place] INR RANGE 2 - 3  Dietary changes: no    Health status changes: no    Bleeding/hemorrhagic complications: no    Recent/future hospitalizations: no    Any changes in medication regimen? no    Recent/future dental: no  Any missed doses?: no       Is patient compliant with meds? yes       Allergies: No Known Drug Allergies  Anticoagulation Management History:      Positive risk factors for bleeding include presence of serious comorbidities.  Negative risk factors for bleeding include an age less than 42 years old.  The bleeding index is 'intermediate risk'.  Positive CHADS2 values include History of CHF and History of HTN.  Negative CHADS2 values include Age > 42 years old.  The start date was 04/20/2000.  His last INR was 2.6 ratio.  Anticoagulation responsible provider: Caryl Comes MD, [name].  Exp: 08/2010.    Anticoagulation Management Assessment/Plan:      The patient's current anticoagulation dose is Warfarin sodium 5 mg tabs: Use as directed by Anticoagulation Clinic.  The target INR is 2.0-3.0.  The next INR is due 08/06/2009.  Anticoagulation instructions were given to patient.  Results were reviewed/authorized by [name] Nakayama, RN, BSN.         Prior Anticoagulation Instructions: INR 1.5 Today 5mg s then resume 2.5mg s daily except 5mg s on Mondays and  Fridays. Recheck in 2 weeks.

## 2010-05-20 NOTE — Letter (Signed)
Summary: ELectrophysiology/Ablation Procedure Instructions  [place]  [id number] [place]   [place], [place] [address]   Phone: [phone]  Fax: [phone]     Ablation Procedure Instructions    You are scheduled for a(n) ABLATION on May 27, 2009 at 1100 with Dr. Caryl Comes.  1.  Please come to the [place] at [facility] at 0900 on the day of your procedure.  2.  Come prepared to stay overnight.   Please bring your insurance cards and a list of your medications.  3.  Come to the [place] office on 05/22/09 for lab work.  The lab at [facility] is open from 8:30 AM to 1:30 PM and 2:30 PM to 5:00 PM.  The lab at [facility] is open from 7:30 AM to 5:30 PM.  You do not have to be fasting.  4.  Do not have anything to eat or drink after midnight the night before your procedure.  5.  Do NOT take these medications the day of your procedure unless otherwise instructed:  FUROSEMIDE.  All of your remaining medications may be taken with a small amount of water.    * Occasionally, EP studies and ablations can become lengthy.  Please make your family aware of this before your procedure starts.  Average time ranges from 2-8 hours for EP studies/ablations.  Your physician will locate your family after the procedure with the results.  * If you have any questions after you get home, please call the office at [phone].

## 2010-05-20 NOTE — Medication Information (Signed)
Summary: rov/sp  Anticoagulant Therapy  Managed by: Porfirio Oar, PharmD Referring MD: Virl Axe MD PCP: none Supervising MD: Lia Foyer MD, Marcello Moores Indication 1: low ejection fraction (ICD-low EF) Indication 2: Congestive Heart Failure (ICD-428.0) Lab Used: [facility] Site: [facility] INR POC 2.3 INR RANGE 2 - 3  Dietary changes: no    Health status changes: no    Bleeding/hemorrhagic complications: no    Recent/future hospitalizations: no    Any changes in medication regimen? no    Recent/future dental: no  Any missed doses?: no       Is patient compliant with meds? yes       Allergies: No Known Drug Allergies  Anticoagulation Management History:      The patient is taking warfarin and comes in today for a routine follow up visit.  Positive risk factors for bleeding include presence of serious comorbidities.  Negative risk factors for bleeding include an age less than 60 years old.  The bleeding index is 'intermediate risk'.  Positive CHADS2 values include History of CHF and History of HTN.  Negative CHADS2 values include Age > 34 years old.  The start date was 04/20/2000.  His last INR was 2.6 ratio.  Anticoagulation responsible provider: Lia Foyer MD, Marcello Moores.  INR POC: 2.3.  Cuvette Lot#: [id number].  Exp: 01/2011.    Anticoagulation Management Assessment/Plan:      The patient's current anticoagulation dose is Warfarin sodium 5 mg tabs: Use as directed by Anticoagulation Clinic.  The target INR is 2.0-3.0.  The next INR is due 12/18/2009.  Anticoagulation instructions were given to patient.  Results were reviewed/authorized by Porfirio Oar, PharmD.  He was notified by Porfirio Oar PharmD.         Prior Anticoagulation Instructions: INR 2.6  Continue same dose of 1/2 tablet every day except 1 tablet on Monday and Friday.    Current Anticoagulation Instructions: INR 2.3  Continue same dose of 1/2 tablet every day except 1 tablet on Monday and Friday.

## 2010-05-20 NOTE — H&P (Signed)
NAME:  [name], GUTHERIE NO.:  [id number]  MEDICAL RECORD NO.:  [id number]          PATIENT TYPE:  EMS  LOCATION:  ED                           FACILITY:  [facility]  PHYSICIAN:  Burnett Harry. Joya Gaskins, MD, [place] OF BIRTH:  1970-04-29  DATE OF ADMISSION:  05/14/2010 DATE OF DISCHARGE:                             HISTORY & PHYSICAL   CHIEF COMPLAINT:  Dizziness and abdominal discomfort.  HISTORY OF PRESENT ILLNESS:  This is a very pleasant 40 year old male patient who carries with him a complex medical history consisting of nonischemic cardiomyopathy with a known ejection fraction of 20-25%, followed by Dr. Virl Axe in the outpatient setting, history of atrial fibrillation, maintained on amiodarone and Coumadin, history of prior ventricular tachycardia and cardiac arrest back in 1999, ultimately requiring defibrillator, and also chronic renal insufficiency with a baseline serum creatinine in outpatient studies of 1.7.  He reports he was recently seen in the cardiology office for a routine check the second week of January.  At that point, he reported to be in his usual state of health with no changes in his normal functional status or activity tolerance.  He reports the current changes initially started sometime around January 19 or 20, at which time he began to experience vague abdominal cramping.  He reported this cramping to continue over the following days.  It continued to worsen in intensity. Ultimately, on January 24, he developed foul-smelling, dark-colored stool, which he described as coffee-ground in color.  This continued on occasion over the course of the following 24 hours.  He presents to the [facility] Emergency Room with the history as presented as well as progressive dizziness, lightheadedness and presyncopal-type sensation. He was seen in evaluation in the emergency room.  His initial hemoglobin was found to be 4.8.  His initial blood pressure was  found to be in the [id number] systolic.  Because of these findings as well as his complex medical history, and the fact that he is on anticoagulation, the Pulmonary Critical Care Team has been asked to admit.  PAST MEDICAL HISTORY:  Nonischemic cardiomyopathy.  This was initially diagnosed in 1997.  He suffered a ventricular fibrillation arrest in 1999.  Ultimately, he had an ICD placed at that time.  He says his defibrillator has gone off approximately two times since the procedure, but ultimately, he has been in very functional good health.  He has a history of atrial fibrillation, and he has been maintained on amiodarone and has been maintained in normal sinus rhythm.  As noted above, his ejection fraction last recorded was 20-25%.  He has a history of severe mitral valve regurgitation, hypertension, and prior cholecystectomy.  FAMILY HISTORY:  He has a brother with congestive heart failure.  SOCIAL HISTORY:  He is a nonsmoker, nondrinker.  He works as a Economist.  He is fully functional in the outpatient setting and without activity deficits in spite of his underlying comorbidities.  DRUG ALLERGIES:  NO KNOWN DRUG ALLERGIES.  CURRENT MEDICATIONS: 1. Coreg 25 mg p.o. b.i.d. 2. Furosemide 40 mg tablet, he takes 60 mg daily. 3. Magnesium oxide 400 mg  daily. 4. Coumadin as directed. 5. Potassium chloride. 6. Amiodarone 200 mg p.o. b.i.d. 7. Spironolactone 25 mg, takes half tablet daily.  REVIEW OF SYSTEMS:  Positive for abdominal discomfort on palpation, dyspnea in the supine position.  Denies nausea, vomiting.  Denies chest pain.  Denies shortness of breath with the head of bed elevated.  No other pertinent positives with the exception of what was discussed in the history of present illness.  PHYSICAL EXAM:  VITAL SIGNS:  Temperature afebrile, heart rate 65, blood pressure 85/60, respirations 20, saturation 97% on 2 liters.  Of note, his baseline weight is 197  pounds. GENERAL:  This is a well-developed 40 year old male patient, currently dyspneic with exertion and dyspneic in the supine position, however, resting comfortably with head of bed elevated. HEENT:  His mucous membranes are pale.  His neck veins are flat.  He has no adenopathy.  His dentition is in good condition.  Pupils are equal and reactive.  Sclerae are nonicteric. PULMONARY:  Clear to auscultation.  Equal breath sounds bilaterally with no accessory muscle use. CARDIAC:  He does have a 3/6 systolic ejection murmur.  I hear no radiation on that exam.  He is normal sinus on telemetry. ABDOMEN:  The abdomen is soft.  He does endorse tenderness to palpation over the right upper and right lower quadrant.  He has positive bowel sounds. EXTREMITIES:  Extremities are without edema. NEUROLOGIC:  Grossly intact. PSYCHIATRIC:  Exam demonstrates mild anxiety only.  LABORATORY FINDINGS:  Hemoglobin 4.8, hematocrit 15.8, platelet count 305, white blood cell count 10.7.  Sodium 137, potassium 5.3, chloride 108, CO2 of 24, BUN 37, creatinine 3.27; please note, his baseline is 1.7.  Glucose 113.  His fecal occult blood was positive.  His PT/INR was 30; it was 25.3/2.28.  IMPRESSION AND PLAN:  Acute upper gastrointestinal bleed with resultant hemorrhagic shock.  At this point, Mr. Reader is pending admission to the intensive care.  We will resuscitate with packed red blood cells, initiate with 2 units of PRBCs initially.  Additionally, will give 2 units of FFP to correct coagulopathy, short-term.  We will refrain from vitamin K given his prior Coumadin requirements for history of atrial fibrillation.  At this point, octreotide drip and Protonix infusions have been started by the emergency room physicians; I agree with this therapy and will continue it.  Gastroenterology services have been called; their consultation is pending for further evaluation. Ultimately, will continue to monitor with  follow-up blood work after transfusions are complete and further Gastroenterology evaluation is pending. 1. Acute on chronic renal failure.  As noted above, his serum     creatinine at baseline is 1.7; it is now 3.27.  Suspect this is     from organ hypoperfusion, probably complicated by his diuretic     regimen and antihypertensive effect.  At this point, will continue     aggressive resuscitation efforts, continue strict intake and output     monitoring, and reevaluate blood chemistry following fluid     resuscitation efforts. 2. Nonischemic cardiomyopathy, with a known ejection fraction of 20-     25%, history of ventricular fibrillation arrest requiring ICD     placement, history of atrial fibrillation now in normal sinus     rhythm.  For this, [facility] will be called to assist in     evaluation as well as monitoring of his care.  At this point,     because he is in hemorrhagic shock, we will  hold off on diuresis,     hold off on Coreg and amiodarone given concern for hypotension and     further organ dysfunction.  He will need to be n.p.o. given pending     Gastroenterology evaluation.  Will plan on initiating an amiodarone     drip should atrial fibrillation become a challenge in his     management.  Again, as noted, he is currently in normal sinus     rhythm.  We will be cautious about fluid resuscitation efforts     given his known cardiomyopathy. 3. Coumadin-induced coagulopathy in the setting of atrial     fibrillation.  His INR is essentially therapeutic at 2.28.  At this     point, however, given the active GI bleed, it was only corrected     with fresh frozen plasma. 4. Hyperkalemia.  I suspect this is secondary to dehydration and acute     renal dysfunction.  Also of note, he does take daily potassium     supplementation as well.  At this point, will hold his supplemental     potassium and recheck the chemistry with volume resuscitation      efforts.  DISPOSITION:  Mr. Magiera is critically ill and 60 minutes of Critical Care time were applied to management, evaluation and dictation of his care.  DISPOSITION:  He is now awaiting transfer to the intensive care.     Salvadore Dom, NP   ______________________________ Burnett Harry. Joya Gaskins, MD, FCCP    PB/MEDQ  D:  05/14/2010  T:  05/14/2010  Job:  [id number]  Electronically Signed by Salvadore Dom NP on 05/16/2010 02:26:03 PM Electronically Signed by Asencion Noble MD FCCP on 05/20/2010 11:17:47 AM

## 2010-05-20 NOTE — Miscellaneous (Signed)
  Clinical Lists Changes  Observations: Added new observation of ECHOINTERP:   Study Conclusions    - Left ventricle: The cavity size was severely dilated. Wall     thickness was normal. Systolic function was severely reduced. The     estimated ejection fraction was in the range of 20% to 25%.     Diffuse hypokinesis.   - Mitral valve: Moderate to severe regurgitation.   - Left atrium: The atrium was moderately dilated.   - Right atrium: The atrium was mildly dilated.   - Atrial septum: No defect or patent foramen ovale was identified.   - Tricuspid valve: Moderate-severe regurgitation.   - Pulmonary arteries: PA peak pressure: 23mm Hg (S).   - Pericardium, extracardiac: A trivial pericardial effusion was     identified posterior to the heart.   Transthoracic echocardiography. M-mode, complete 2D, spectral   Doppler, and color Doppler. Height: Height: 172.7cm. Height: 68in.   Weight: Weight: 85.3kg. Weight: 187.6lb. Body mass index: BMI:   28.6kg/m^2. Body surface area: BSA: 1.80m^2. Blood pressure: 116/74.   Patient status: Outpatient. Location: [facility] (04/26/2009 12:21) Added new observation of CXR RESULTS:  Findings: Heart markedly enlarged but stable.  Indwelling AICD /   pacemaker unchanged and intact.  Mild pulmonary venous hypertension   without overt pulmonary edema.  No focal airspace consolidation.   No pleural effusions.  Visualized bony thorax intact.  Lateral   image blurred by respiratory motion.    IMPRESSION:   Stable marked cardiomegaly.  Mild pulmonary venous hypertension   without overt edema.  No acute cardiopulmonary disease.    Read By:  Deniece Portela,  M.D. (03/21/2009 12:20)      CXR  Procedure date:  03/21/2009  Findings:       Findings: Heart markedly enlarged but stable.  Indwelling AICD /   pacemaker unchanged and intact.  Mild pulmonary venous hypertension   without overt pulmonary edema.  No focal airspace consolidation.   No  pleural effusions.  Visualized bony thorax intact.  Lateral   image blurred by respiratory motion.    IMPRESSION:   Stable marked cardiomegaly.  Mild pulmonary venous hypertension   without overt edema.  No acute cardiopulmonary disease.    Read By:  Deniece Portela,  M.D.  Echocardiogram  Procedure date:  04/26/2009  Findings:        Study Conclusions    - Left ventricle: The cavity size was severely dilated. Wall     thickness was normal. Systolic function was severely reduced. The     estimated ejection fraction was in the range of 20% to 25%.     Diffuse hypokinesis.   - Mitral valve: Moderate to severe regurgitation.   - Left atrium: The atrium was moderately dilated.   - Right atrium: The atrium was mildly dilated.   - Atrial septum: No defect or patent foramen ovale was identified.   - Tricuspid valve: Moderate-severe regurgitation.   - Pulmonary arteries: PA peak pressure: 10mm Hg (S).   - Pericardium, extracardiac: A trivial pericardial effusion was     identified posterior to the heart.   Transthoracic echocardiography. M-mode, complete 2D, spectral   Doppler, and color Doppler. Height: Height: 172.7cm. Height: 68in.   Weight: Weight: 85.3kg. Weight: 187.6lb. Body mass index: BMI:   28.6kg/m^2. Body surface area: BSA: 1.54m^2. Blood pressure: 116/74.   Patient status: Outpatient. Location: [facility]

## 2010-05-20 NOTE — Progress Notes (Signed)
  Phone Note Outgoing Call   Call placed by: Barnett Abu, RN, BSN,  May 22, 2009 3:56 PM Call placed to: Patient Summary of Call: S/W pt to have him come to Short Stay at 1030 instead of 0900 as originally planned on 05/27/09. Pt aware and understands Initial call taken by: Barnett Abu, RN, BSN,  May 22, 2009 3:58 PM

## 2010-05-20 NOTE — Medication Information (Signed)
Summary: rov/tm  Anticoagulant Therapy  Managed by: Freddrick March, RN, BSN Referring MD: Virl Axe MD PCP: none Supervising MD: Percival Spanish MD, Jeneen Rinks Indication 1: low ejection fraction (ICD-low EF) Indication 2: Congestive Heart Failure (ICD-428.0) Lab Used: [facility] Site: [place] INR POC 3.0 INR RANGE 2 - 3  Dietary changes: no    Health status changes: no    Bleeding/hemorrhagic complications: no    Recent/future hospitalizations: no    Any changes in medication regimen? no    Recent/future dental: no  Any missed doses?: no       Is patient compliant with meds? yes       Allergies (verified): No Known Drug Allergies  Anticoagulation Management History:      The patient is taking warfarin and comes in today for a routine follow up visit.  Positive risk factors for bleeding include presence of serious comorbidities.  Negative risk factors for bleeding include an age less than 66 years old.  The bleeding index is 'intermediate risk'.  Positive CHADS2 values include History of CHF and History of HTN.  Negative CHADS2 values include Age > 56 years old.  The start date was 04/20/2000.  His last INR was 2.8 ratio.  Anticoagulation responsible provider: Percival Spanish MD, Jeneen Rinks.  INR POC: 3.0.  Cuvette Lot#: [id number].  Exp: 05/2010.    Anticoagulation Management Assessment/Plan:      The patient's current anticoagulation dose is Warfarin sodium 5 mg tabs: Use as directed by Anticoagulation Clinic.  The target INR is 2.0-3.0.  The next INR is due 05/17/2009.  Anticoagulation instructions were given to patient.  Results were reviewed/authorized by Freddrick March, RN, BSN.  He was notified by Freddrick March RN.         Prior Anticoagulation Instructions: INR 3.1 Friday take 2.5mg s then resume 2.5mg s daily except 5mg s on Mondays, Wednesdays and Fridays. Recheck in 2 weeks.   Current Anticoagulation Instructions: INR 3.0  Start taking 1/2 tablet daily except 1 tablet on Mondays  and Fridays.  Recheck in 3 weeks.

## 2010-05-20 NOTE — Cardiovascular Report (Signed)
Summary: Pre Op Orders  Pre Op Orders   Imported By: Sallee Provencal 05/21/2009 16:17:27  _____________________________________________________________________  External Attachment:    Type:   Image     Comment:   External Document

## 2010-05-20 NOTE — Assessment & Plan Note (Signed)
Summary: GUIDIANT/SF   Visit Type:  ICD-[employer], check Referring Provider:  Jolyn Nap, MD Primary Provider:  none  CC:  no complaints.  History of Present Illness:        [name] is seen in followup 4 congestive heart failure in the setting of nonischemic cardiopathy and more recently refractory atrial fibrillation. He has a history of ventricular fibrillation and is status post ICD implantation. He has had appropriate post implant therapy. His QRS is narrow.  He is demented with amiodarone for his atrial fibrillation and cardioverted and has been doing quite well. He denies significant chest pain or shortness of breath or exercise intolerance. He was seen by Dr.Allred in consultation for consideration of pulmonary vein isolation. It was felt that the risk benefits were not in his favor with a very large left atrium significant mitral regurgitation.     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 Take 1 Tablet By Mouth Twice A Day 2)  Furosemide 40 Mg Tabs (Furosemide) .... Take 1 1/2 Tablet By Mouth Two Times A Day 3)  Magnesium Oxide 400 Mg Tabs (Magnesium Oxide) .... One By Mouth Daily 4)  Warfarin Sodium 5 Mg Tabs (Warfarin Sodium) .... Use As Directed By Anticoagulation Clinic 5)  Lisinopril 40 Mg Tabs (Lisinopril) .... Take One Tablet By Mouth Daily 6)  Potassium Chloride Crys Cr 20 Meq Cr-Tabs (Potassium  Chloride Crys Cr) .... Take 1/2 Tablet Daily. 7)  Amiodarone Hcl 200 Mg Tabs (Amiodarone Hcl) .... Take Two Tablet By Mouth Once Daily 8)  Spironolactone 25 Mg Tabs (Spironolactone) .... Take 0.5  Tablet By Mouth Daily  Allergies (verified): No Known Drug Allergies  Past History:  Past Medical History: Last updated: 03/26/2009 Congestive heart failure 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0%) Persistent atrial fibrillation with chronic anticoagulation therapy. --Tikosyn load 2008 Possible small echodense thrombus in the tip  of the left atrial appendage by prior TEE.  History of medical noncompliance.  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  Severe tricuspid valve regurgitation.  Severe mitral valve regurgitation.  Status post cardiac catheterization (right heart catheterization)       March 2008 showing severe biventricular congestive heart failure with marked filling and pressures.  Recurrent polymorphic ventricular tachycardia and ventricular fibrillation with appropriate shock therapy in the past.  Hypertension.  Gout  Past Surgical History: Last updated: 03-26-09 ICD implantation cholecystecomy  Family History: Last updated: 03/26/09 Brother died with CHF.  No other family members with cardiomyopathy.  Social History: Last updated: 2009-03-26 lives in [place].  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 Single.   Tobacco Use - No.  Alcohol Use - no Regular Exercise - no Drug Use - no  Risk Factors: Exercise: no (10/19/2008)  Risk Factors: Smoking Status: never (10/19/2008)  Vital Signs:  Patient profile:   40 year old male Height:      68 inches Weight:  197 pounds BMI:     30.06 Pulse rate:   59 / minute BP sitting:   128 / 75  (right arm) Cuff size:   regular  Vitals Entered By: Hansel Feinstein CMA (February 11, 2010 12:47 PM)  Physical Exam  General:  The patient was alert and  oriented in no acute distress. HEENT Normal.  Neck veins were flat, carotids were brisk.  Lungs were clear.  Heart sounds were regular without murmurs or gallops.  Abdomen was soft with active bowel sounds. There is no clubbing cyanosis or edema. Skin Warm and dry    EKG  Procedure date:  02/11/2010  Findings:      sinus rhythm at 59 Intervals 0.20/0.11/0.46 Axis is -17 Poor R-wave progression Biatrial enlargement   ICD Specifications Following MD:  Virl Axe, MD     ICD Vendor:  [employer]     ICD Model Number:  T180     ICD Serial Number:  [id number] ICD DOI:  07/09/2004     ICD Implanting MD:  Virl Axe, MD  Lead 1:    Location: RV     DOI: 09/03/1997     Model #: [id number]     Serial #: [id number]     Status: active  Indications::  SCD   ICD Follow Up Remote Check?  No Battery Voltage:  2.57 V     Charge Time:  14.6 seconds     Battery Est. Longevity:  MOL2 ICD Dependent:  No       ICD Device Measurements Right Ventricle:  Amplitude: 15.6 mV, Impedance: 697 ohms, Threshold: 0.8 V at 0.4 msec Shock Impedance: 42 ohms   Episodes Coumadin:  Yes Shock:  0     ATP:  0     Nonsustained:  0     Ventricular Pacing:  9%  Brady Parameters Mode VVI     Lower Rate Limit:  40      Tachy Zones VF:  200     Next Cardiology Appt Due:  04/20/2010 Tech Comments:  No parameter changes.  Device function noramal.  ROV 3 months clinic. Alma Friendly, LPN  October 25, 624THL 12:46 PM   Impression & Recommendations:  Problem # 1:  ATRIAL FIBRILLATION (ICD-427.31)  The patient is holding sinus rhythm on amiodarone. We'll plan to decrease his dose from 400-200 mg a day. We will check his surveillance laboratories today  Orders: TLB-TSH (Thyroid Stimulating Hormone) (84443-TSH) TLB-BMP (Basic Metabolic Panel-BMET) (99991111)  Problem # 2:  ABORTED CARDIAC ARREST (ICD-427.5)  no intercurrent ventricular arrhythmias  His updated medication list for this problem includes:     Carvedilol 25 Mg Tabs (Carvedilol) .Marland Kitchen... Take 1 tablet by mouth twice a day    Warfarin Sodium 5 Mg Tabs (Warfarin sodium) ..... Use as directed by anticoagulation clinic    Lisinopril 40 Mg Tabs (Lisinopril) .Marland Kitchen... Take one tablet by mouth daily    Amiodarone Hcl 200 Mg Tabs (Amiodarone hcl) .Marland Kitchen... Take two tablet by mouth once daily  Problem # 3:  IMPLANTATION OF DEFIBRILLATOR, GUIDANT VITALITY T180 (ICD-V45.02) Device parameters and data were reviewed and no changes were made  Problem # 4:  SYSTOLIC HEART FAILURE, CHRONIC (ICD-428.22)  the patient's heart failure status is relatively stable. With his prolactin we will check his potassium levels today. His updated medication list for this problem includes:    Carvedilol 25 Mg Tabs (Carvedilol) .Marland Kitchen... Take 1 tablet by mouth twice a day    Furosemide 40 Mg  Tabs (Furosemide) .Marland Kitchen... Take 1 1/2 tablet by mouth two times a day    Warfarin Sodium 5 Mg Tabs (Warfarin sodium) ..... Use as directed by anticoagulation clinic    Lisinopril 40 Mg Tabs (Lisinopril) .Marland Kitchen... Take one tablet by mouth daily    Amiodarone Hcl 200 Mg Tabs (Amiodarone hcl) .Marland Kitchen... Take one  tablet by mouth once daily    Spironolactone 25 Mg Tabs (Spironolactone) .Marland Kitchen... Take 0.5  tablet by mouth daily  His updated medication list for this problem includes:    Carvedilol 25 Mg Tabs (Carvedilol) .Marland Kitchen... Take 1 tablet by mouth twice a day    Furosemide 40 Mg Tabs (Furosemide) .Marland Kitchen... Take 1 1/2 tablet by mouth two times a day    Warfarin Sodium 5 Mg Tabs (Warfarin sodium) ..... Use as directed by anticoagulation clinic    Lisinopril 40 Mg Tabs (Lisinopril) .Marland Kitchen... Take one tablet by mouth daily    Amiodarone Hcl 200 Mg Tabs (Amiodarone hcl) .Marland Kitchen... Take two tablet by mouth once daily    Spironolactone 25 Mg Tabs (Spironolactone) .Marland Kitchen... Take 0.5  tablet by mouth daily  Orders: TLB-TSH (Thyroid Stimulating Hormone) (84443-TSH) TLB-BMP (Basic Metabolic Panel-BMET) (99991111)  Patient  Instructions: 1)  Your physician recommends that you have TSH and BMET cheched today. 2)  Your physician has recommended you make the following change in your medication: Amiodarone Hcl 200 mg one tablet by mouth once daily.  3)  Your physician recommends that you schedule a follow-up appointment in: 3 months with [facility]

## 2010-05-20 NOTE — Medication Information (Signed)
Summary: rov/ewj  Anticoagulant Therapy  Managed by: Tula Nakayama, RN, BSN Referring MD: Virl Axe MD PCP: none Supervising MD: Johnsie Cancel MD, Collier [name] Indication 1: low ejection fraction (ICD-low EF) Indication 2: Congestive Heart Failure (ICD-428.0) Lab Used: [place] Site: [place] INR POC 2.2 INR RANGE 2 - 3  Dietary changes: no    Health status changes: no    Bleeding/hemorrhagic complications: no    Recent/future hospitalizations: no    Any changes in medication regimen? no    Recent/future dental: no  Any missed doses?: no       Is patient compliant with meds? yes       Allergies: No Known Drug Allergies  Anticoagulation Management History:      The patient is taking warfarin and comes in today for a routine follow up visit.  Positive risk factors for bleeding include presence of serious comorbidities.  Negative risk factors for bleeding include an age less than 86 years old.  The bleeding index is 'intermediate risk'.  Positive CHADS2 values include History of CHF and History of HTN.  Negative CHADS2 values include Age > 81 years old.  The start date was 04/20/2000.  His last INR was 2.6 ratio.  Anticoagulation responsible provider: Johnsie Cancel MD, [name].  INR POC: 2.2.  Cuvette Lot#: [id number].  Exp: 11/2010.    Anticoagulation Management Assessment/Plan:      The patient's current anticoagulation dose is Warfarin sodium 5 mg tabs: Use as directed by Anticoagulation Clinic.  The target INR is 2.0-3.0.  The next INR is due 09/24/2009.  Anticoagulation instructions were given to patient.  Results were reviewed/authorized by Tula Nakayama, RN, BSN.  He was notified by Tula Nakayama, RN, BSN.         Prior Anticoagulation Instructions: INR 1.9  Take 1 tablet today then resume same dosage 1/2 tablet daily except 1 tablet on Mondays and Fridays.  Recheck in 3 weeks.    Current Anticoagulation Instructions: INR 2.2 Continue 2.5mg s daily except 5mg s on Mondays and Fridays.  Recheck in 4 weeks.

## 2010-05-21 LAB — CBC
HCT: 28.8 % — ABNORMAL LOW (ref 39.0–52.0)
Hemoglobin: 9.9 g/dL — ABNORMAL LOW (ref 13.0–17.0)
MCH: 27.8 pg (ref 26.0–34.0)
MCHC: 34.4 g/dL (ref 30.0–36.0)
MCV: 80.9 fL (ref 78.0–100.0)
RDW: 18.2 % — ABNORMAL HIGH (ref 11.5–15.5)

## 2010-05-21 LAB — COMPREHENSIVE METABOLIC PANEL
ALT: 32 U/L (ref 0–53)
BUN: 17 mg/dL (ref 6–23)
CO2: 28 mEq/L (ref 19–32)
Calcium: 8.6 mg/dL (ref 8.4–10.5)
Creatinine, Ser: 1.28 mg/dL (ref 0.4–1.5)
GFR calc non Af Amer: >60 mL/min (ref >60)
Glucose, Bld: 106 mg/dL — ABNORMAL HIGH (ref 70–99)
Sodium: 137 mEq/L (ref 135–145)

## 2010-05-22 LAB — CBC
HCT: 31 % — ABNORMAL LOW (ref 39.0–52.0)
RBC: 3.79 MIL/uL — ABNORMAL LOW (ref 4.22–5.81)
RDW: 18.5 % — ABNORMAL HIGH (ref 11.5–15.5)
WBC: 11 10*3/uL — ABNORMAL HIGH (ref 4.0–10.5)

## 2010-05-22 LAB — CROSSMATCH
Antibody Screen: NEGATIVE
Unit division: 0
Unit division: 0
Unit division: 0

## 2010-05-22 NOTE — Miscellaneous (Signed)
Summary: Orders Update  Clinical Lists Changes  Orders: Added new Test order of TLB-BMP (Basic Metabolic Panel-BMET) (80048-METABOL) - Signed 

## 2010-05-22 NOTE — Medication Information (Signed)
Summary: rov/sp  Anticoagulant Therapy  Managed by: Porfirio Oar, PharmD Referring MD: Virl Axe MD PCP: none Supervising MD: Rayann Heman MD, Jeneen Rinks Indication 1: low ejection fraction (ICD-low EF) Indication 2: Congestive Heart Failure (ICD-428.0) Lab Used: [place] Site: [facility] INR POC 1.9 INR RANGE 2 - 3  Dietary changes: no    Health status changes: no    Bleeding/hemorrhagic complications: no    Recent/future hospitalizations: no    Any changes in medication regimen? no    Recent/future dental: no  Any missed doses?: yes     Details: missed 1 dose 3 weeks ago  Is patient compliant with meds? yes       Allergies: No Known Drug Allergies  Anticoagulation Management History:      The patient is taking warfarin and comes in today for a routine follow up visit.  Positive risk factors for bleeding include presence of serious comorbidities.  Negative risk factors for bleeding include an age less than 53 years old.  The bleeding index is 'intermediate risk'.  Positive CHADS2 values include History of CHF and History of HTN.  Negative CHADS2 values include Age > 47 years old.  The start date was 04/20/2000.  His last INR was 2.6 ratio.  Anticoagulation responsible provider: Dorr Perrot MD, Jeneen Rinks.  INR POC: 1.9.  Cuvette Lot#: [id number].  Exp: 05/2011.    Anticoagulation Management Assessment/Plan:      The patient's current anticoagulation dose is Warfarin sodium 5 mg tabs: Use as directed by Anticoagulation Clinic.  The target INR is 2.0-3.0.  The next INR is due 05/28/2010.  Anticoagulation instructions were given to patient.  Results were reviewed/authorized by Porfirio Oar, PharmD.  He was notified by Porfirio Oar PharmD.         Prior Anticoagulation Instructions: INR 2.0  Continue same dose of 1/2 tablet every day except 1 tablet on Monday and Friday.  Recheck INR in 4 weeks.   Current Anticoagulation Instructions: INR 1.9  Take an extra 1/2 tablet today then resume same  dose of 1/2 tablet every day except 1 tablet on Monday, Wednesday and Friday.  Recheck INR in 4 weeks.

## 2010-05-23 ENCOUNTER — Encounter (INDEPENDENT_AMBULATORY_CARE_PROVIDER_SITE_OTHER): Payer: Self-pay | Admitting: *Deleted

## 2010-05-28 NOTE — Discharge Summary (Signed)
NAME:  [name], [name] NO.:  [id number]  MEDICAL RECORD NO.:  [id number]           PATIENT TYPE:  I  LOCATION:  [id number]                         FACILITY:  [facility]  PHYSICIAN:  Oren Binet, MD    DATE OF BIRTH:  Apr 01, 1971  DATE OF ADMISSION:  05/14/2010 DATE OF DISCHARGE:                        DISCHARGE SUMMARY - REFERRING   PRIMARY CARE PHYSICIAN:  The patient does not have one.  PRIMARY CARDIOLOGIST:  Deboraha Sprang, MD, [facility], [facility].  PRIMARY GASTROENTEROLOGIST:  Nelwyn Salisbury, MD, [facility]  PRIMARY DISCHARGE DIAGNOSES: 1. Gastrointestinal bleed, possibly source from the small bowel, now     seems to have resolved. 2. Hemorrhagic shock secondary to gastrointestinal bleed, now     resolved. 3. Acute blood loss anemia, status post multiple units of packed red     blood cells transfusion. 4. Coagulopathy secondary to Coumadin, now reversed with normal INR. 5. Acute gouty arthritis, especially in his bilateral knees,     responding to steroids. 6. Acute on chronic kidney disease, now resolved. 7. Leukocytosis secondary to steroids/stress, now resolved.  SECONDARY DISCHARGE DIAGNOSES: 1. History of paroxysmal atrial fibrillation, previously on chronic     Coumadin therapy.  Coumadin now on hold due to extensive GI     bleeding. 2. History of nonischemic cardiomyopathy, status post AICD placement. 3. Chronic systolic heart failure, this is nonischemic. 4. History of ventricular fibrillation, status post automatic     implantable cardiovascular defibrillator placement. 5. Pandiverticulosis seen on colonoscopy. 6. Internal and external hemorrhoids seen on colonoscopy. 7. History of hypertension. 8. History of severe tricuspid and mitral regurgitation.  DISCHARGE MEDICATIONS: 1. Amiodarone 200 mg 1 tablet p.o. twice daily. 2. Coreg 12.5 mg p.o. twice daily. 3. Lasix 20 mg 1 tablet p.o. twice daily. 4. Vicodin 5/325 one to two tablets p.o.  every 6 hours p.r.n.. 5. Solu-Medrol 40 mg IV daily to take for another 2 more days. 6. Aldactone 12.5 mg p.o. daily.  CONSULTANTS ON THE CASE: 1. Champ Mungo. Lovena Le, MD, from [facility] Cardiology. 2. Nelwyn Salisbury, MD, [facility], from gastroenterology. 3. Dickey Gave, MD, [facility].  BRIEF HISTORY:  The patient is a 40 year old African American male with a complex medical history consisting of nonischemic cardiomyopathy with the last EF around 20% to 25%; history of atrial fibrillation, maintained on amiodarone and Coumadin; history of prior ventricular tachycardia and cardiac arrest back in 1999, ultimately requiring a defibrillator with a baseline creatinine of around 1.7, who was brought into the hospital on the 25th for dizziness and abdominal pain.  He was also found to have foul-smelling and dark colored stools, which he described as coffee ground color.  He was then evaluated in the emergency room where his hemoglobin was found to be 4.8.  His initial blood pressure reading was found to be [id number] to [id number] systolic.  He was then initially admitted by the pulmonary critical care service for volume resuscitation.  For further details, please see the history and physical that was dictated by Dr. Asencion Noble on admission.  DISCHARGE LABORATORY DATA:  Show a WBC of 11.0, hemoglobin of  10.5, hematocrit of 31.0 and a platelet count of 328,000.  Last set of chemistries done on May 21, 2010 shows a sodium of 137, potassium of 4.2, creatinine of 1.28.  PROCEDURES PERFORMED: 1. Colonoscopy done on May 18, 2010, showed diverticular changes     throughout the entire colon.  No recent active bleeding. 2. Colonoscopy done on May 16, 2010, showed pandiverticulosis with     no obvious source of bleeding as well.  There were external and     internal hemorrhoids. 3. Nuclear medicine gastrointestinal bleeding study done on January 28     showed persistent linear  activity below the urinary bladder in the     midline.  This is an unusual focus for an active bleeding site and     could either be within the anorectal junction, possibly within the     bowel and scrotal hernia or represent contamination. 4. Nuclear medicine gastrointestinal bleeding study on May 15, 1010, showed negative gastrointestinal bleeding study. 5. Capsule endoscopy on May 20, 1010, showed a lot of debris in     the distal stomach and in portions of the small bowel.  Two small     specks of old heme in the proximal small bowel and a spot of fresh     heme with erosion in the distal small bowel.  This lesion does not     seem to be an AVM.  RADIOLOGICAL STUDIES:  Portable chest x-ray showed right IJ central venous catheter tip in the SVC, no pneumothorax.  BRIEF HOSPITAL COURSE: 1. Hemorrhagic shock.  On admission, the patient was found to have a     hemoglobin of 4.8 and a systolic blood pressure in the 70s to 80s.     He was also having melanotic stools.  He was initially admitted by     the critical care service and volume resuscitated.  He was also     given multiple units of red blood cells transfusion. He probably got around 11 units and also got around 8 units of fresh frozen plasma to reverse his INR as well.  Throughout his hospital course, he continued to have frank hematochezia or melanotic stools; however, for the past 3 days, this seems to have resolved.  He has had no further requirement for blood transfusion in the past 72 hours as well.  His hemoglobin has been steady with the last one being around 10.5.  He has had numerous procedures to find the etiology of his gastrointestinal bleed including 2 colonoscopies and 2 bleeding scans. These have all failed to locate a bleeding source.  Surgical input was also sought during this admission in case the patient continued to have recurrent bleeding so that he could then go to the OR for colectomy  as well.  Eventually and thankfully, the bleeding seems to have resolved. However, a capsule endoscopy seems to show perhaps a spot in the distal small bowel which may be the culprit lesion.  Dr. Collene Mares has now recommended that this patient be transferred to [facility] for a balloon enteroscopy.  I have spoken to the accepting physician over there, Dr. Oswaldo Milian.  He has kindly accepted this patient for transfer to [facility] for balloon enteroscopy. 1. Acute blood loss anemia.  As noted above, this is secondary to his     massive gastrointestinal bleed.  He has had numerous units of FFPs     and PRBCs as outlined above.  His hemoglobin  has been consistently     stable around 9 and 10 for the past 3 days. 2. Acute gouty arthritis.  This is mostly affecting his left knee.  He     has required Celebrex and prednisone in the past.  Given his GI     bleeding, we have stayed off using any nonsteroidal anti-     inflammatory drugs.  He has been provided IV Solu-Medrol, and this     will need to be continued for another 2 more days. 3. Acute on chronic kidney disease.  This was secondary to his massive     GI bleed.  This is now resolved. 4. History of nonischemic cardiomyopathy.  The etiology is idiopathic.     Cardiology has been following this patient throughout this     hospitalization.  As noted above, he does have an AICD in place.     He does also carry a prior diagnosis of ventricular tachycardia and     ventricular fibrillation with a past history of cardiac arrest in     the late 1990s.  The patient's primary cardiologist is Dr. Virl Axe of [facility].  In terms of his heart failure, he is     currently compensated and has remained compensated throughout his     hospitalization. 5. History of paroxysmal atrial fibrillation.  He was on chronic     Coumadin therapy prior to this admission.  Obviously, Coumadin has     been discontinued given his massive GI bleed.  He  continues to be     on oral amiodarone and Coreg.  Resumption of the Coumadin will need     to be discussed with GI and Cardiology if it needs to be resumed at     all in the immediate future.  DISPOSITION:  The patient is to be transferred to [facility] for a bowel enteroscopy.  FOLLOWUP INSTRUCTIONS:  The patient will need followup with his primary cardiologist, Dr. Virl Axe, as soon as he is discharged from [facility].  The issue of Coumadin will need to be sorted out whether to be resumed or not depending upon his GI findings and upon discussion with the cardiology team as well.  Total time spent equals 45 minutes.     Oren Binet, MD     SG/MEDQ  D:  05/22/2010  T:  05/22/2010  Job:  [id number]  cc:   Deboraha Sprang, MD, [place]. [address]  [address] [place] [address]  Jyothi Nat Mann, MD, [facility] Fax: [phone]  Electronically Signed by Oren Binet  on 05/28/2010 04:12:43 PM

## 2010-05-28 NOTE — Letter (Signed)
Summary: Appointment - Reminder [place]  [address]   [place], [place] [address]   Phone: [phone]  Fax: [phone]     May 23, 2010 MRN: [id number]   [place], [place]  [id number]   Dear Mr. STEENHOEK,  [place] records indicate that it is past time to schedule a follow-up appointment. Dr.Klein recommended that you follow up with [place] in January. It is very important that we reach you to schedule this appointment. We look forward to participating in your health care needs. Please contact us at the number listed above at your earliest convenience to schedule your appointment.  If you are unable to make an appointment at this time, give [place] a call so we can update our records.     Sincerely,   Public relations account executive

## 2010-05-28 NOTE — Procedures (Signed)
Summary: Colonoscopy  Patient: [name] Note: All result statuses are Final unless otherwise noted.  Tests: (1) Colonoscopy (COL)   COL Colonoscopy           [facility]     [place], [place]  [id number]           COLONOSCOPY PROCEDURE REPORT           PATIENT:  [name], [name]  MR#:  [id number]     BIRTHDATE:  Jul 16, 1970, 39 yrs. old  GENDER:  male     ENDOSCOPIST:  Milus Banister, MD     PROCEDURE DATE:  05/18/2010     PROCEDURE:  Diagnostic Colonoscopy     ASA CLASS:  Class III     INDICATIONS:  persistent GI bleeding (so far 8 units prbc);     suspected diverticular (2 negative Nuc med bleeding scans,     colonoscopy once by Dr. Benson Norway showed pan diverticulosis, EGD by Dr.     Collene Mares once was normal)     MEDICATIONS:  Fentanyl 50 mcg IV, Versed 5 mg IV           DESCRIPTION OF PROCEDURE:   After the risks benefits and     alternatives of the procedure were thoroughly explained, informed     consent was obtained.  Digital rectal exam was performed and     revealed no rectal masses.   The  endoscope was introduced through     the anus and advanced to the cecum, which was identified by both     the appendix and ileocecal valve, without limitations.  The     quality of the prep was adequate, using Colyte.  The instrument     was then slowly withdrawn as the colon was fully examined.     <<PROCEDUREIMAGES>>     FINDINGS:   There were diverticulum throughout entire colon. There     was no recent blood, no active bleeding. No blood clots. None of     the diverticulum showed any signs of recent bleeding (see image1,     image2, and image5).  This was otherwise a normal examination of     the colon (see image3, image4, and image6).   Retroflexed views in     the rectum revealed no abnormalities.    The scope was then     withdrawn from the patient and the procedure completed.     COMPLICATIONS:  None           ENDOSCOPIC IMPRESSION:     1)  Diverticular changes throughout entire colon.  No recent or     active bleeding.     2) Otherwise normal examination           RECOMMENDATIONS:     Observe clinicaly for rebleeding.  Still most suspicious for     diverticular bleed.           ______________________________     Milus Banister, MD           cc: Juanita Craver, MD           n.     Lorrin Mais:   Milus Banister at 05/18/2010 12:12 PM           [name], [id number]  Note: An exclamation mark (!) indicates a result that was not dispersed into the flowsheet. Document Creation Date: 05/18/2010 12:33 PM  _______________________________________________________________________  (1) Order result status: Final Collection or observation date-time: 05/18/2010 12:06 Requested date-time:  Receipt date-time:  Reported date-time:  Referring Physician:   Ordering Physician: Owens Loffler [phone]) Specimen Source:  Source: Tawanna Cooler Order Number: [id number] Lab site:

## 2010-05-29 NOTE — Consult Note (Signed)
NAME:  [name], TRELA NO.:  [id number]  MEDICAL RECORD NO.:  [id number]          PATIENT TYPE:  INP  LOCATION:  1222                         FACILITY:  [facility]  PHYSICIAN:  Champ Mungo. Lovena Le, MD    DATE OF BIRTH:  Jun 04, 1970  DATE OF CONSULTATION: DATE OF DISCHARGE:                                CONSULTATION   PRIMARY CARDIOLOGIST:  Deboraha Sprang, MD, [facility] and also Shaune Pascal. [name], MD  PATIENT PROFILE:  A 40 year old African American male with history of VF arrest, nonischemic cardiomyopathy and AFib, status post cardioversion on chronic Coumadin, amiodarone who presents with acute upper GI bleed and shock. 1. Acute upper GI bleed with hemoglobin and hematocrit of 4.8 and     15.8. 2. Hypotension and orthostasis. 3. Chronic systolic congestive heart failure/nonischemic     cardiomyopathy.     a.     Status post negative Myoview in 2006.     b.     EF 20% to 25% with diffuse hypokinesis in January 2011. 4. History of VF arrest in 1999.     a.     Status post Guidant Vitality in March 2006, this was an      upgrade from device initially placed in 1999 and subsequently in      2002. 5. Paroxysmal atrial fibrillation.     a.     Status post multiple cardioversions, the last of which was      in December 2010.     b.     The patient previously on Tikosyn.     c.     The patient currently on amiodarone. 6. Severe tricuspid regurgitation. 7. Severe mitral regurgitation. 8. Recurrent polymorphic ventricular tachycardia. 9. Hypertension. 10.Reported history of noncompliance. 11.Acute on chronic kidney disease with creatinine of 3.27 in the     setting of above.  ALLERGIES:  No known drug allergies.  HISTORY OF PRESENT ILLNESS:  A 40 year old African American male with the above problem list.  From a heart failure standpoint, he does quite well and is active without significant dyspnea or limitations.  He also has a history of atrial fibrillation, status  post cardioversion in December 2010.  He has been maintaining sinus rhythm on amiodarone.  He is also on Coumadin with therapeutic INR.  The patient noted last Thursday a dark stool x1, which recurred this past Monday and he has now had multiple dark and somewhat loose stools as well as abdominal bloating.  Over the past 24 hours, he has been developing lightheadedness and today, he could not stand at all and has been significantly fatigued.  He presents to the ED where his hemoglobin and hematocrit is 4.8 and 15.8, and he is also hypotensive.  He is currently receiving packed red blood cells as well as fluid resuscitation.  Of note, his creatinine is also elevated on admission at 3.27 in the setting of above.  HOME MEDICATIONS: 1. Coreg 25 mg b.i.d. 2. Lasix 40 mg 1-1/2 tablet b.i.d. 3. Mag-Ox 400 mg daily. 4. Coumadin 5 mg as directed. 5. Lisinopril 40 mg daily. 6. K-Dur 20 mEq  1/2 tablet daily. 7. Amiodarone 200 mg daily. 8. Spironolactone 25 mg 1/2 tablet daily.  FAMILY HISTORY:  Mother is alive and well at 83.  Father died of cancer at 59.  His brother died of heart failure.  SOCIAL HISTORY:  The patient lives in [place] by himself.  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  He denies tobacco, alcohol, or drug use.  He tries to remain active at home.  REVIEW OF SYSTEMS:  Notable for dark stools as well as orthostasis.  He denies chest pain or dyspnea.  He is a full code.  Otherwise, all systems reviewed are negative.  PHYSICAL EXAMINATION:  VITAL SIGNS:  Temperature 97.8, heart rate 60, respirations 20, blood pressure 94/57, pulse ox 98% on room air. GENERAL:  Pleasant African American male, in no acute distress.  Awake, alert and oriented x3.  He has normal affect. HEENT:  Normal. NEUROLOGIC:  Grossly intact.  Nonfocal. SKIN:  Warm, dry without lesions or masses. NECK:  Supple without bruits or JVD. LUNGS:  Respirations are unlabored.  Clear to  auscultation. CARDIAC:  Regular S1 and S2.  No S3 or S4 with 2/6 systolic murmur noted at the left lower sternal border, also at the apex. ABDOMEN:  Round, semi-firm, and diffusely tender.  Bowel sounds are present x4. EXTREMITIES:  Warm, dry.  No clubbing, cyanosis, or edema.  Dorsalis pedis, posterior tibial pulses 2+ and equal bilaterally.  Chest x-ray shows clear lungs.  No pneumothorax and central line placement.  Hemoglobin 4.8, hematocrit 15.8, WBC 7.7, platelets 308. Sodium 137, potassium 5.3, chloride 108, CO2 of 24, BUN 37, creatinine 3.27, glucose 113, CK 110, MB 0.8, troponin I 0.05.  ASSESSMENT AND PLAN: 1. Upper gastrointestinal bleed with hypotension.  The patient is     receiving fluid resuscitation as well as packed red blood cells and     is to be admitted by Critical Care.  Obviously, Coumadin is on hold     along with all the patient's other medications in the setting of     hypotension.  Recommend reinitiation of cardiac meds once blood     pressure allows.  We will have to make a decision on Coumadin once     source of gastrointestinal bleed is clear.  GI evaluation pending. 2. Chronic systolic congestive heart failure associated with     nonischemic cardiomyopathy.  The patient is euvolemic and as above,     if anything hypovolemic.  We will have to watch fluid closely in     the setting of packed red blood cells and hydration.  All     medications are on hold as above secondary to hypotension.  Resume     when possible. 3. Paroxysmal atrial fibrillation, currently in sinus rhythm.     Amiodarone is on hold as obviously is Coumadin.  Resume amiodarone     when possible. 4. History of ventricular fibrillation arrest or nonsustained     ventricular tachycardia.  The patient is status post ICD     placement.  We will resume amiodarone and beta-blocker when     feasible. 5. Acute on chronic kidney disease.  ACE inhibitor and diuretics on     hold.  He is mildly  hyperkalemic.  Suspect this will improve with     hydration and blood.  Resume ACE and spirolactone when clinically     feasible.     Murray Hodgkins, ANP   ______________________________ Champ Mungo. Lovena Le, MD  CB/MEDQ  D:  05/14/2010  T:  05/14/2010  Job:  [id number]  Electronically Signed by Murray Hodgkins ANP on 05/19/2010 12:42:52 PM Electronically Signed by Cristopher Peru MD on 05/29/2010 05:18:36 PM

## 2010-06-05 ENCOUNTER — Emergency Department (HOSPITAL_COMMUNITY)
Admission: EM | Admit: 2010-06-05 | Discharge: 2010-06-05 | Disposition: A | Discharge status: Home / Self Care | Payer: 59 | Attending: Emergency Medicine | Admitting: Emergency Medicine

## 2010-06-05 DIAGNOSIS — M2550 Pain in unspecified joint: Secondary | ICD-10-CM | POA: Insufficient documentation

## 2010-06-05 DIAGNOSIS — B9789 Other viral agents as the cause of diseases classified elsewhere: Secondary | ICD-10-CM | POA: Insufficient documentation

## 2010-06-05 DIAGNOSIS — M109 Gout, unspecified: Secondary | ICD-10-CM | POA: Insufficient documentation

## 2010-06-05 DIAGNOSIS — R509 Fever, unspecified: Secondary | ICD-10-CM | POA: Insufficient documentation

## 2010-06-05 DIAGNOSIS — I509 Heart failure, unspecified: Secondary | ICD-10-CM | POA: Insufficient documentation

## 2010-06-05 DIAGNOSIS — I10 Essential (primary) hypertension: Secondary | ICD-10-CM | POA: Insufficient documentation

## 2010-06-05 DIAGNOSIS — I252 Old myocardial infarction: Secondary | ICD-10-CM | POA: Insufficient documentation

## 2010-06-05 DIAGNOSIS — I4891 Unspecified atrial fibrillation: Secondary | ICD-10-CM | POA: Insufficient documentation

## 2010-06-05 LAB — CBC
Platelets: 532 10*3/uL — ABNORMAL HIGH (ref 150–400)
RBC: 4.18 MIL/uL — ABNORMAL LOW (ref 4.22–5.81)
WBC: 16.7 10*3/uL — ABNORMAL HIGH (ref 4.0–10.5)

## 2010-06-05 LAB — DIFFERENTIAL
Eosinophils Absolute: 0 10*3/uL (ref 0.0–0.7)
Eosinophils Relative: 0 % (ref 0–5)
Monocytes Absolute: 1.5 10*3/uL — ABNORMAL HIGH (ref 0.1–1.0)
Neutrophils Relative %: 82 % — ABNORMAL HIGH (ref 43–77)

## 2010-06-05 LAB — POCT I-STAT, CHEM 8
BUN: 21 mg/dL (ref 6–23)
Chloride: 102 mEq/L (ref 96–112)
Glucose, Bld: 106 mg/dL — ABNORMAL HIGH (ref 70–99)
HCT: 35 % — ABNORMAL LOW (ref 39.0–52.0)
Potassium: 5.2 mEq/L — ABNORMAL HIGH (ref 3.5–5.1)

## 2010-06-05 LAB — URIC ACID: Uric Acid, Serum: 4.4 mg/dL (ref 4.0–7.8)

## 2010-06-05 NOTE — Consult Note (Signed)
Summary: [facility] Consultation Report  [facility] Consultation Report   Imported By: Roddie Mc 05/27/2010 16:32:45  _____________________________________________________________________  External Attachment:    Type:   Image     Comment:   External Document

## 2010-06-16 ENCOUNTER — Encounter: Payer: Self-pay | Admitting: Cardiology

## 2010-06-16 DIAGNOSIS — I4891 Unspecified atrial fibrillation: Secondary | ICD-10-CM

## 2010-06-30 ENCOUNTER — Telehealth (INDEPENDENT_AMBULATORY_CARE_PROVIDER_SITE_OTHER): Payer: Self-pay | Admitting: *Deleted

## 2010-07-06 NOTE — Progress Notes (Signed)
NAME:  [name], SCHLEGEL NO.:  [id number]  MEDICAL RECORD NO.:  [id number]          PATIENT TYPE:  INP  LOCATION:  1222                         FACILITY:  [facility]  PHYSICIAN:  Vernell Leep, MD     DATE OF BIRTH:  [birth year]                                PROGRESS NOTE   DATE OF DISCHARGE: Yet to be determined.  PRIMARY CARE PHYSICIAN: The patient does not have a primary MD.  PRIMARY CARDIOLOGIST: Deboraha Sprang, MD, [facility]  CURRENT DIAGNOSES: 1. Massive lower gastrointestinal bleeding, possibly diverticular. 2. Hemorrhagic shock secondary to gastrointestinal bleed, resolved. 3. Acute blood loss anemia status post multiple packed red blood cell     transfusions. 4. Acute gouty arthritis bilateral knees, left greater than the right. 5. Acute on chronic kidney disease.  Acute renal failure, resolved. 6. Paroxysmal atrial fibrillation, now in sinus rhythm. 7. Nonischemic cardiomyopathy status post AICD. 8. Chronic systolic congestive heart failure, compensated. 9. Leukocytosis secondary to steroids or stress. 10.Pan diverticulosis, on colonoscopy. 11.Internal and external hemorrhoids on colonoscopy. 12.Coagulopathy on admission secondary to Coumadin.  Coumadin is     discontinued. 13.Hyperkalemia, resolved. 14.Hypertension. 15.Status post cholecystectomy.  DISCHARGE MEDICATIONS: To be dictated by discharging MD.  PROCEDURES: 1. Colonoscopy, on May 18, 2010.  Impression,     a.     Diverticular changes throughout the entire colon.  No recent      or active bleeding.     b.     Otherwise normal exam. 2. Colonoscopy on the May 16, 2010.  Impression,     a.     Pan diverticula - most likely the source of bleeding.     b.     Internal and external hemorrhoids. 3. Nuclear Medicine gastrointestinal bleeding study on May 17, 2010.  Impression, persistent linear activity below the urinary     bladder in the midline.  This is an unusual focus for  an active     bleeding site and could either be within the anorectal junction     possibly within bowel in a scrotal hernia or represent     contamination. 4. Nuclear Medicine gastrointestinal bleeding study on May 15, 2010.  Impression, negative gastrointestinal bleeding scan study.  IMAGING STUDIES: Chest x-ray on May 14, 2010.  Impression, right internal jugular central venous catheter tip in the mid SVC.  No pneumothorax.  LABORATORY DATA: CBC today hemoglobin 9.4, hematocrit 27.3, white blood cell 11.3, platelets 279.  INR 1.26.  Basic metabolic panel within normal limits with BUN 17, creatinine 1.28.  BNP was 85.  Serum uric acid on May 16, 2010 was 7.5.  Cardiac enzymes were cycled and negative.  Stool for occult blood was positive.  On admission, on May 14, 2010, the patient had a potassium of 5.3, BUN 37, creatinine 3.27 and LFT showed total bilirubin 0.2, albumin of 2.7. Admitting CBC had a hemoglobin of 4.8 and INR was 2.28.  CONSULTATIONS: 1. Cardiology from the [facility]. 2. Gastroenterology, Nelwyn Salisbury, MD, [facility] 3. Surgery from the [facility]. 4.  Interventional Radiology.  CHIEF COMPLAINT: 1. Recurrence of left knee pain, which is moderate to severe. 2. The patient indicates that he has had 2 to 3 BMs yesterday and 1     this morning and none of these had blood in them.  He denies any     abdominal pain, nausea, vomiting.  He denies dyspnea, chest pain or     palpitations.  PHYSICAL EXAMINATION: GENERAL:  Mr. [name] is in no obvious distress.  Telemetry shows sinus bradycardia in the 50s to sinus rhythm in the 60s.  VITAL SIGNS: Temperature 98.2 degrees Fahrenheit, pulse rate 53 per minute ranging from 53 to 71 per minute, blood pressure 125/86 mmHg, CVP 8 mm of water, respiration 15 per minute, oxygen saturation 99% on room air.  A 24-hour input output shows intake of 1660 mL and output of 2202 mL  leading to negative balance of 542. RESPIRATORY:  Clear to auscultation and no increased work of breathing. CARDIOVASCULAR:  First and second heart sounds heard.  Regular rate and rhythm.  No murmurs or JVD. ABDOMEN:  Slightly obese, but nontender, soft and bowel sounds present. CENTRAL NERVOUS SYSTEM:  The patient is awake, alert, oriented x3 with no focal neurological deficits. EXTREMITIES:  Left knee mildly swollen, warm and tender with painful range of movements, but no redness or open wounds.  HOSPITAL COURSE: Mr. [name] is a pleasant 40 year old African American male patient with history of ventricular fibrillation cardiac arrest, nonischemic cardiomyopathy with left ventricular ejection fraction of 20% to 25%, atrial fibrillation, status post cardioversion who was on chronic Coumadin and was admitted on May 14, 2010 by the pulmonary critical team with lower GI bleeding and hemorrhagic shock with a hemoglobin of 4.8 and systolic blood pressure in the 70s to 80s.  He presented with abdominal cramping followed by foul-smelling dark-colored stools, which he described as coffee ground, which started on May 13, 2010 and that progressively got worse.  He was admitted to the intensive care unit under the critical care service team.  They placed a central line, resuscitated him with fluids and blood transfusions and reversed his anticoagulation with FFPs.  They consulted gastroenterology and cardiology and general surgery.  1. Massive lower gastrointestinal bleeding.  The colonoscopies twice     have shown pan diverticulosis although definite point of bleeding     has not been identified.  The patient remains on clear liquid     diet.  Despite multiple (11 units) of packed red blood cell     transfusions and FFPs, the patient until yesterday continued to     have bloody stools and dropping hematocrits.  In the last 24 hours,     however, his GI bleeding seems to have decreased  and his hemoglobin     seems to be holding steady in the last few hours.  He is scheduled     to have a capsule endoscopy this morning to determine the source of     bleeding.  If patient starts re-bleeding and the capsule endoscopy     is negative, the patient may have to eventually undergo subtotal     colectomy.  Cardiology service has cleared him for subtotal     colectomy. 2. Hemorrhagic shock secondary to massive lower GI bleed.  Shock has     resolved and the patient has been hemodynamically stable in excess     of 48 hours. 3. Acute blood loss anemia.  This has been status post multiple units  of packed red blood and FFP transfusions.  His hemoglobin seems to     be somewhat stable in the last 2 draws.  We will continue to check     his hemoglobin and hematocrits frequently and transfuse as needed     to keep a hemoglobin of greater than 9.  He has also been getting     periodic FFPs. 4. Acute gouty arthritis of left greater than the right knee.  The     patient indicates he has history of gout and presents similarly.     He has used Celebrex and prednisone in the past.  At this time, we     cannot use the nonsteroidal anti-inflammatory drugs secondary to     his GI bleed.  He was provided IV Solu-Medrol for 2 days, which     significantly helped his left knee pains, but has recurred again     today.  We will place him back on IV Solu-Medrol for 3 days and     monitor. 5. Acute on chronic kidney disease on admission, possibly secondary to     hypovolemic/hemorrhagic shock and diuretics.  This may also have     been contributed by ACE inhibitors.  Acute renal failure has     resolved. 6. Paroxysmal atrial fibrillation.  The patient continues to be on     oral amiodarone and Coreg.  His Coumadin has obviously been     discontinued and Cardiology continues to follow him. 7. Nonischemic cardiomyopathy, which is possibly idiopathic and the     patient is status post AICD.   Cardiology continues to follow and     this has remained stable. 8. Chronic systolic congestive heart failure which is compensated.  DISPOSITION: The patient is at risk for recurrent GI bleeding and hence we will continued to be monitored in the ICU as a step-down status.  Hopefully, he does not have a recurrence of GI bleed and his diet can be advanced. His further course will be determined by his clinical progress.     Vernell Leep, MD     AH/MEDQ  D:  05/20/2010  T:  05/20/2010  Job:  [id number]  Electronically Signed by Vernell Leep MD on 07/06/2010 07:13:34 PM

## 2010-07-08 NOTE — Progress Notes (Signed)
  Pt Dropped Off Attending Physicians Statement sent to [facility] for [place] to [employer]. [facility] [place]  June 30, 2010 2:57 PM

## 2010-07-10 ENCOUNTER — Encounter: Payer: Self-pay | Admitting: Internal Medicine

## 2010-07-22 ENCOUNTER — Encounter: Payer: Self-pay | Admitting: Internal Medicine

## 2010-07-22 ENCOUNTER — Ambulatory Visit (INDEPENDENT_AMBULATORY_CARE_PROVIDER_SITE_OTHER): Payer: 59 | Admitting: Internal Medicine

## 2010-07-22 DIAGNOSIS — Z95810 Presence of automatic (implantable) cardiac defibrillator: Secondary | ICD-10-CM | POA: Insufficient documentation

## 2010-07-22 DIAGNOSIS — I4891 Unspecified atrial fibrillation: Secondary | ICD-10-CM

## 2010-07-22 DIAGNOSIS — I428 Other cardiomyopathies: Secondary | ICD-10-CM

## 2010-07-22 DIAGNOSIS — I5022 Chronic systolic (congestive) heart failure: Secondary | ICD-10-CM

## 2010-07-22 LAB — PROTIME-INR: Prothrombin Time: 24.5 seconds — ABNORMAL HIGH (ref 11.6–15.2)

## 2010-07-22 MED ORDER — LISINOPRIL 20 MG PO TABS: 20.0000 mg | ORAL_TABLET | Freq: Every day | ORAL | Status: DC

## 2010-07-22 MED ORDER — AMIODARONE HCL 200 MG PO TABS: ORAL_TABLET | ORAL | Status: DC

## 2010-07-22 NOTE — Assessment & Plan Note (Signed)
He takes a great deal of amiodarone for his atrial fibrillation. We will plan to reduce the dose from 400-300 mg a day

## 2010-07-22 NOTE — Assessment & Plan Note (Signed)
As above currently class II

## 2010-07-22 NOTE — Progress Notes (Signed)
HPI  [name] is a 40 y.o. male History of atrial fibrillation is persistent. 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 He has had post implant appropriate therapy. He also has severe tricuspid regurgitation. Functional status has been quite good without significant shortness of breath or chest pain or peripheral edema.  He was hospitalized in January because of GI bleeding. He underwent a series of complex procedures. He recently found also to have rheumatoid arthritis. He comes in today quite frustrated however, because he brought a disability form by which had been  Directed this way by his gastroenterologist. I told him we would not be able to help him with this.  Past Medical History  Diagnosis Date  . CHF (congestive heart failure)     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%)  . Arrhythmia     PERSISTENT ATRIAL FIBRILLATION WITH CHRONIC ANTICOAGULATION THERAPY...[place] LOAD 2008  . Noncompliance     H/O  MEDICAL NONCOMPLIANCE  . 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 Tricuspid valve regurgitation     SEVERE  . Severe mitral regurgitation   . Polymorphic ventricular tachycardia     RECURRENT WITH APPROPRIATE SHOCK THERAPY IN THE PAST  . Ventricular fibrillation     WITH APPROPRIATE SHOCK THERAPY IN THE PAST  . Hypertension   . Gout     Past Surgical History  Procedure Date  . Cardiac catheterization 06/2006    RIGHT HEART CATH SHOWING SEVERE BIVENTRICUALR CHF WITH MARKED FILLING AND PRESSURES  . Insert / replace / remove pacemaker     GUIDANT HE ICD MODEL 2180, SERIAL # [id number]  . Cholecystectomy     Current Outpatient Prescriptions  Medication Sig Dispense Refill  . carvedilol (COREG) 6.25 MG tablet Take 6.25 mg by mouth 2 (two) times daily with a meal.        . docusate sodium (COLACE) 100 MG capsule  Take 100 mg by mouth 2 (two) times daily.        . folic acid (FOLVITE) 1 MG tablet 2 tablets Daily.      . furosemide (LASIX) 40 MG tablet Take 40 mg by mouth daily.       . iron polysaccharides (NIFEREX) 150 MG capsule Take 150 mg by mouth 2 (two) times daily.        . methotrexate (RHEUMATREX) 2.5 MG tablet 3 tablets Once a week.      . predniSONE (DELTASONE) 5 MG tablet 1 tablet Daily.      Marland Kitchen spironolactone (ALDACTONE) 25 MG tablet Take 25 mg by mouth daily. Take 1/2 tab qd       . amiodarone (PACERONE) 200 MG tablet Take 200 mg by mouth daily. TAKE 2 TABLETS ONCE DAILY      . DISCONTD: carvedilol (COREG) 25 MG tablet Take 25 mg by mouth 2 (two) times daily with a meal.       . DISCONTD: lisinopril (PRINIVIL,ZESTRIL) 20 MG tablet Take 20 mg by mouth daily.        Marland Kitchen DISCONTD: magnesium oxide (MAG-OX) 400 MG tablet Take 400 mg by mouth daily.        Marland Kitchen DISCONTD: potassium chloride SA (K-DUR,KLOR-CON) 20 MEQ tablet Take 20 mEq by mouth daily. Take 1/2 (half) tab qd       . DISCONTD: warfarin (COUMADIN) 2.5 MG tablet Take by  mouth as directed.          No Known Allergies  Review of Systems negative except from HPI and PMH  Physical Exam Well developed and well nourished in no acute distress HENT normal E scleral and icterus clear Neck Supple JVP flat; carotids brisk and full Clear to ausculation Regular rate and rhythm, no murmurs gallops or rub Soft with active bowel sounds No clubbing cyanosis and edema Alert and oriented, grossly normal motor and sensory function Skin Warm and Dry    Assessment and  Plan

## 2010-07-22 NOTE — Patient Instructions (Addendum)
Your physician recommends that you schedule a follow-up appointment in: 3 months with device clinic. AND SEE DR [place] physician recommends that you return for lab work in: 3 weeks bmet  Dx v58.69 Your physician has recommended you make the following change in your medication: DECREASE AMIODARONE TO 300 MG EVERY DAY START LISINOPRIL 10 MG EVERY DAY

## 2010-07-22 NOTE — Assessment & Plan Note (Addendum)
He had recent blood work drawn. We will review the creatinine. In the event that is okay we will resume his lisinopril at 10 mg a day. We will refill his carvedilol and aldac lactone  Creatinine was reviewed and it was less than one

## 2010-07-22 NOTE — Assessment & Plan Note (Signed)
The patient's device was interrogated.  The information was reviewed. No changes were made in the programming.    

## 2010-07-24 LAB — DIFFERENTIAL
Basophils Absolute: 0 K/uL (ref 0.0–0.1)
Basophils Relative: 0 % (ref 0–1)
Eosinophils Absolute: 0 K/uL (ref 0.0–0.7)
Eosinophils Relative: 0 % (ref 0–5)
Lymphocytes Relative: 24 % (ref 12–46)
Lymphs Abs: 1.9 K/uL (ref 0.7–4.0)
Monocytes Absolute: 0.7 K/uL (ref 0.1–1.0)
Monocytes Relative: 8 % (ref 3–12)
Neutro Abs: 5.5 K/uL (ref 1.7–7.7)
Neutrophils Relative %: 68 % (ref 43–77)

## 2010-07-24 LAB — BASIC METABOLIC PANEL WITH GFR
BUN: 13 mg/dL (ref 6–23)
CO2: 27 meq/L (ref 19–32)
Calcium: 8.9 mg/dL (ref 8.4–10.5)
Chloride: 102 meq/L (ref 96–112)
Creatinine, Ser: 1.32 mg/dL (ref 0.4–1.5)
GFR calc non Af Amer: >60 mL/min
Glucose, Bld: 136 mg/dL — ABNORMAL HIGH (ref 70–99)
Potassium: 3.6 meq/L (ref 3.5–5.1)
Sodium: 138 meq/L (ref 135–145)

## 2010-07-24 LAB — BRAIN NATRIURETIC PEPTIDE
Pro B Natriuretic peptide (BNP): 385 pg/mL — ABNORMAL HIGH (ref 0.0–100.0)
Pro B Natriuretic peptide (BNP): 728 pg/mL — ABNORMAL HIGH (ref 0.0–100.0)

## 2010-07-24 LAB — CK TOTAL AND CKMB (NOT AT ARMC)
CK, MB: 0.6 ng/mL (ref 0.3–4.0)
Relative Index: 0.4 (ref 0.0–2.5)
Total CK: 135 U/L (ref 7–232)

## 2010-07-24 LAB — CARDIAC PANEL(CRET KIN+CKTOT+MB+TROPI)
CK, MB: 0.6 ng/mL (ref 0.3–4.0)
Relative Index: 0.5 (ref 0.0–2.5)
Relative Index: 0.5 (ref 0.0–2.5)

## 2010-07-24 LAB — MAGNESIUM: Magnesium: 2.2 mg/dL (ref 1.5–2.5)

## 2010-07-24 LAB — POCT CARDIAC MARKERS: Myoglobin, poc: 64.2 ng/mL (ref 12–200)

## 2010-07-24 LAB — COMPREHENSIVE METABOLIC PANEL
Albumin: 3.5 g/dL (ref 3.5–5.2)
Alkaline Phosphatase: 53 U/L (ref 39–117)
BUN: 19 mg/dL (ref 6–23)
Potassium: 3.7 mEq/L (ref 3.5–5.1)
Sodium: 140 mEq/L (ref 135–145)
Total Protein: 7.8 g/dL (ref 6.0–8.3)

## 2010-07-24 LAB — PROTIME-INR
INR: 1.83 — ABNORMAL HIGH (ref 0.00–1.49)
INR: 2.21 — ABNORMAL HIGH (ref 0.00–1.49)
Prothrombin Time: 24.3 s — ABNORMAL HIGH (ref 11.6–15.2)
Prothrombin Time: 30.5 seconds — ABNORMAL HIGH (ref 11.6–15.2)

## 2010-07-24 LAB — BASIC METABOLIC PANEL
BUN: 16 mg/dL (ref 6–23)
CO2: 28 mEq/L (ref 19–32)
Chloride: 102 mEq/L (ref 96–112)
Creatinine, Ser: 1.43 mg/dL (ref 0.4–1.5)
Glucose, Bld: 112 mg/dL — ABNORMAL HIGH (ref 70–99)

## 2010-07-24 LAB — CBC
HCT: 35.8 % — ABNORMAL LOW (ref 39.0–52.0)
Hemoglobin: 11.1 g/dL — ABNORMAL LOW (ref 13.0–17.0)
MCV: 70.4 fL — ABNORMAL LOW (ref 78.0–100.0)
RDW: 19.4 % — ABNORMAL HIGH (ref 11.5–15.5)
WBC: 8.1 10*3/uL (ref 4.0–10.5)

## 2010-07-24 LAB — DIGOXIN LEVEL: Digoxin Level: 0.6 ng/mL — ABNORMAL LOW (ref 0.8–2.0)

## 2010-07-24 LAB — TROPONIN I

## 2010-07-24 LAB — APTT

## 2010-07-24 LAB — LIPID PANEL
HDL: 30 mg/dL — ABNORMAL LOW
Total CHOL/HDL Ratio: 4.5 ratio
Triglycerides: 187 mg/dL — ABNORMAL HIGH
VLDL: 37 mg/dL (ref 0–40)

## 2010-07-24 LAB — D-DIMER, QUANTITATIVE: D-Dimer, Quant: <0.22 ug/mL-FEU (ref 0.00–0.48)

## 2010-07-30 NOTE — Progress Notes (Signed)
Addended by: Doug Sou on: 07/30/2010 04:52 PM   Modules accepted: Orders

## 2010-08-11 ENCOUNTER — Telehealth: Payer: Self-pay | Admitting: Cardiology

## 2010-08-11 ENCOUNTER — Other Ambulatory Visit (INDEPENDENT_AMBULATORY_CARE_PROVIDER_SITE_OTHER): Payer: 59 | Admitting: *Deleted

## 2010-08-11 DIAGNOSIS — Z79899 Other long term (current) drug therapy: Secondary | ICD-10-CM

## 2010-08-11 DIAGNOSIS — E876 Hypokalemia: Secondary | ICD-10-CM

## 2010-08-11 LAB — BASIC METABOLIC PANEL
CO2: 31 mEq/L (ref 19–32)
Chloride: 102 mEq/L (ref 96–112)
Potassium: 3.1 mEq/L — ABNORMAL LOW (ref 3.5–5.1)
Sodium: 138 mEq/L (ref 135–145)

## 2010-08-11 MED ORDER — POTASSIUM CHLORIDE CRYS ER 20 MEQ PO TBCR: 20.0000 meq | EXTENDED_RELEASE_TABLET | Freq: Every day | ORAL | Status: DC

## 2010-08-11 NOTE — Telephone Encounter (Signed)
Patient's potassium 3.1. Spoke with Dr. Stanford Breed (DOD) and we will restart patient on 20 meq of potassium daily with a repeat BMP 08/19/10. Pt. is aware.

## 2010-08-19 ENCOUNTER — Other Ambulatory Visit (INDEPENDENT_AMBULATORY_CARE_PROVIDER_SITE_OTHER): Payer: 59 | Admitting: *Deleted

## 2010-08-19 DIAGNOSIS — I428 Other cardiomyopathies: Secondary | ICD-10-CM

## 2010-08-19 LAB — BASIC METABOLIC PANEL
BUN: 13 mg/dL (ref 6–23)
CO2: 27 mEq/L (ref 19–32)
Chloride: 107 mEq/L (ref 96–112)
Glucose, Bld: 84 mg/dL (ref 70–99)
Potassium: 3.6 mEq/L (ref 3.5–5.1)

## 2010-09-02 NOTE — Assessment & Plan Note (Signed)
[place] OFFICE NOTE   TANYA, RUNNING                        MRN:          [id number]  DATE:01/03/2007                            DOB:          04-03-1971    INTERVAL HISTORY:  [name] is a 40 year old male with a history of a  nonischemic cardiomyopathy with an ejection fraction of 25-30%.  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  The remainder of his history is notable  for severe mitral regurgitation and atrial fibrillation.  I am seeing  him today as an add-on from the [facility].  Over the past few months  he has noticed several episodes where he kind of feels weak and a warm  feeling.  He denies any frank syncope with this.  He has not had any  chest pain, no lower extremity edema, no orthopnea, no PND.  He  continues to work as a Economist and has not noticed any change in  his functional capacity.  Over the weekend he felt some more flutterings  and a weak feeling and so he was told to come today to the device clinic  for a checkup.  In the device clinic interrogation of his ICD showed one  episode of ventricular fibrillation which was defibrillated and there  were 4 or 5 other episodes which were aborted due to spontaneous  conversion back to sinus rhythm.   CURRENT MEDICATIONS:  1. Coreg 25 b.i.d.  2. Lasix 40 b.i.d.  3. Coumadin.  4. Potassium 20 b.i.d.  5. Prinivil 40 a day.  6. Lanoxin 0.125 a day.   PHYSICAL EXAM:  He is in no acute distress, ambulates around the clinic  without any respiratory difficulty.  Blood pressure is 118/78.  Heart  rate is 85.  HEENT:  Normal.  NECK:  Supple.  There is no JVD.  Carotids are 1+ bilaterally without  bruits.  There is no lymphadenopathy or thyromegaly.  CARDIAC:  PMI is widely displaced.  He is regularly irregular.  He has a  3/6 holosystolic murmur at the apex with  a prominent S3.  LUNGS:  Clear.  ABDOMEN:  Soft, nontender, nondistended.  There is no  hepatosplenomegaly, no bruits, no masses, good bowel sounds.  EXTREMITIES:  Warm with no cyanosis, clubbing or edema.  No rash.  NEURO:  Alert and oriented x3.  Cranial nerves II through XII are  intact.  He moves all 4 extremities without difficulty.  Affect is  pleasant.   EKG shows atrial fibrillation with diffuse nonspecific ST-T wave  abnormalities.  QRS duration is 84 milliseconds, there were occasional  PVCs.   ASSESSMENT AND PLAN:  1. Recurrent ventricular fibrillation status post implantable      cardioverter-defibrillator defibrillation.  I discussed this with      Dr. Lovena Le.  Given that this is only his second episode in nearly a      year we will hold off on initiating amiodarone therapy at this  time.  We are both concerned, however, for worsening heart failure      substrate, although his functional capacity appears to be      relatively well preserved.  At this point will go ahead and check      his electrolytes as well as a TSH, will repeat his echocardiogram      and set him up for a CPX test to clearly define where he is on the      transplant spectrum.  2. Congestive heart failure secondary to severe nonischemic      cardiomyopathy.  At this point, he continues to be [employer] [place] [employer]      Association Class II, but I had a long talk with him today that I      told him that I felt that it was only a matter of when and not if      he was going to need a transplant.  As above, we will set him up      for a CPX test.  He is on good medications, but given his recurrent      ventricular fibrillation we will also increase his Coreg to 37.5      twice daily.  We will also check another repeat echocardiogram.  3. Atrial fibrillation, this is chronic.  He did have a TEE in the      recent past which showed left atrial appendage thrombus but now has      been on Coumadin for some time.   Should his functional capacity be      low, it may be worth considering a trial of cardioversion.      However, since he is maintaining atrial fibrillation after his      defibrillation through his implantable cardioverter-defibrillator,      he will almost certainly need antiarrhythmic therapy if this is the      route we choose.  QRS is currently narrow and unsure if he would      benefit from a biventricular device.   DISPOSITION:  Will see him back in the heart failure clinic in 2 weeks.     Shaune Pascal. [name], MD  Electronically Signed    DRB/MedQ  DD: 01/03/2007  DT: 01/03/2007  Job #: [id number]

## 2010-09-02 NOTE — Assessment & Plan Note (Signed)
[place] OFFICE NOTE   ZELMER, MEUSE                        MRN:          [id number]  DATE:11/03/2007                            DOB:          March 31, 1971    HISTORY OF PRESENT ILLNESS:  Mr. Kieran is seen in followup for aborted  sudden cardiac death in the setting of a cardiomyopathy that got better  and has now worsened.  He has on interrogation frequent episodes of  polymorphic ventricular tachycardia; these were long, short, coupled.  He is on Tikosyn therapy for atrial fibrillation with inappropriate  therapies.  His exercise tolerance is quite good.  I should note that  his Coreg dose was decreased because of significant ventricular pacing.   OTHER MEDICATIONS:  1. Lanoxin 0.125.  2. Prinivil 40.  3. Potassium 20 b.i.d..  4. Coumadin.  5. Furosemide 40 b.i.d.   PHYSICAL EXAMINATION:  VITAL SIGNS:  His blood pressure 113/70 with  pulse of 71.  LUNGS:  Clear.  HEART:  The heart sounds were regular today.  EXTREMITIES:  Without edema.  ABDOMEN:  Soft with active bowel sounds.   Interrogation of his Guidant ICD demonstrated stable pacing parameters  and his device is relatively new having been implanted in March.  Interrogation of his device demonstrated on October 20, 2007, a series of  runs of nonsustained ventricular tachycardia associated with  nonsustained polymorphic VT.   IMPRESSION:  1. Recurrent nonsustained polymorphic ventricular tachycardia.  2. Nonischemic cardiomyopathy.  3. Aborted sudden cardiac death with appropriate implantable      cardioverter-defibrillator therapy in the past for ventricular      fibrillation and ventricular tachycardia.  4. Status post implantable cardioverter-defibrillator.  5. Atrial fibrillation with inappropriate therapy on Tikosyn  6. Bradycardia.  7. Q-T interval today of 430 milliseconds.   Mr. Shadowens is having polymorphic ventricular in the  setting of long,  short, coupling.  His ventricular fall back rate was decreased today  from 12% to  9% to try and shorten the interval.  We will see if that  helps.  I have also asked to increase his Coreg from 25 back to 37.5.  We will have to follow a degree of bradycardia and ventricular pacing.   I will check the potassium and magnesium level today and we will begin  him on remote followup, so we can keep a closer eye on his ventricular  tachycardia.     Deboraha Sprang, MD, [facility]  Electronically Signed    SCK/MedQ  DD: 11/03/2007  DT: 11/04/2007  Job #: [id number]

## 2010-09-02 NOTE — Assessment & Plan Note (Signed)
[place] OFFICE NOTE   [name], [name]                        MRN:          [id number]  DATE:01/21/2007                            DOB:          07/13/1970    He came in with a history of atrial flutter, and he came in in atrial  flutter. He was cardioverted to sinus rhythm, however, he did not  maintain sinus rhythm and lapsed back into atrial flutter with a  controlled ventricular rate. He saw Dr. Caryl Comes after the cardioversion  and the plan would be going forward, the patient comes back Friday,  October 10th, for Tikosyn, and I will call the office to let them know  that they should schedule this for Friday, October 10th, and to call him  with a time to come on in. His cell number is [phone].     [name], [place]     GM/MedQ  DD: 01/21/2007  DT: 01/22/2007  Job #: [id number]

## 2010-09-02 NOTE — Assessment & Plan Note (Signed)
[place] OFFICE NOTE   GUMECINDO, KEHOE                        MRN:          [id number]  DATE:03/14/2007                            DOB:          11/17/1970    Mr. Lambert was seen today in the clinic on March 14, 2007 at the  patient's request for an ICD discharge.  On interrogation of his device  today, his battery voltage is 3.07 with charge times of 8.2 seconds.  R  waves measure 20.5 mV with a ventricular capture threshold of 0.8 volts  at 0.5 ms and a ventricular lead impedance of 718 ohms.  Shock impedance  was 47.  There were 2 VF shocks; one was commanded, the other was this  morning, also one back on March 05, 2007 which was diverted, and 2  non-sustained episodes noted since last interrogation.  The patient is  feeling fine, went back to work without any residual issues, and was  instructed to call if any other discharges take place.  I will discuss  this with Dr. Caryl Comes and decide what route of care we are going to take.      Alma Friendly, LPN  Electronically Signed      Deboraha Sprang, MD, [facility]  Electronically Signed   PO/MedQ  DD: 03/14/2007  DT: 03/14/2007  Job #: [id number]

## 2010-09-02 NOTE — Discharge Summary (Signed)
NAME:  [name], [name] NO.:  [id number]   MEDICAL RECORD NO.:  [id number]          PATIENT TYPE:  INP   LOCATION:  2022                         FACILITY:  [facility]   PHYSICIAN:  Donnelly Angelica, M.D.    DATE OF BIRTH:  Aug 26, 1970   DATE OF ADMISSION:  01/28/2007  DATE OF DISCHARGE:  02/03/2007                               DISCHARGE SUMMARY   FINAL DIAGNOSIS:  1. Admitted with history of atrial fibrillation and now for Tikosyn      therapy.        A:  Spontaneous conversion to sinus rhythm after dose number five  or Tikosyn at [facility].        B:  Coumadin anticoagulation.   SECONDARY DIAGNOSES:  1. History of congestive heart failure/nonischemic cardiomyopathy,      ejection fraction 25 to 30%.  2. Ongoing atrial fibrillation with chronic anticoagulation.  3. Medical noncompliance.  4. 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  5. Severe tricuspid regurgitation/mitral regurgitation.  6. Left heart catheterization March of 2008 showing severe      biventricular congestive heart failure with marked filling      pressures.  7. Status post recent Myoview study March of 2006 with scarring at the      apex, no evidence of ischemia.  8. Recurrent polymorphic ventricular tachycardia and ventricular      flutter with appropriate shock therapy in the past.  9. Hypertension.   BRIEF HISTORY:  [name] is a 40 year old male.  He has a  history of nonischemic cardiomyopathy with ejection fraction 25 to 30%.  He also has a history of aborted sudden cardiac death and is status post  ICD.  Patient is followed in the heart failure clinics because he has  severe biventricular congestive heart failure.   [name] has also had episodes of presyncope with fluttering  sensation.  Patient has history of atrial fibrillation with occasional  PVCs.  The patient may have worsening heart failure in the setting  of  both VFib and AFib.  Patient is anticoagulated with Coumadin since a  previous TEE showed left atrial appendage thrombus.  Patient would  benefit from antiarrhythmic therapy.  The patient is brought in October  10 to proceed with Tikosyn therapy.   HOSPITAL COURSE:  Patient admitted to [facility] October 10  electively.  His INR was elevated and so Coumadin was held for the first  two days.  His INR has been followed carefully throughout this admission  and when it fell below 2.0, he was placed on IV heparin.  Patient's  potassium and magnesium have been carefully monitored and supplemented  as needed.  Patient will go home on an increased dose of potassium from  20 to 40 mEq daily.  He will also be started on magnesium oxide.  Patient had five does of Tikosyn and was in preparation for direct  cardioversion when he converted spontaneously to sinus rhythm on October  13.  Patient has been watched for a further 48  hours and is ready for  discharge on October 16.   He discharges on:  1. Tikosyn 500 mcg twice daily; this is new medication.  2. Coumadin 5 mg daily except 7.5 mg Wednesdays.  3. Digoxin 0.125 mg daily.  4. Prinivil 40 mg daily.  5. Potassium chloride 40 mEq daily.  6. Lasix 40 mg twice daily.  7. Coreg 37.5 mg twice daily.  8. Aldactone 25 mg daily; this is a new medication to help spare      potassium.  9. Magnesium oxide 400 mg twice daily.   He follows up at [facility], [address] at the  [facility] Wednesday, October 31, at 3:15 p.m.  He sees the  physician assistant on Thursday, November 6, at 2 o'clock.   LABS STUDIES THIS ADMISSION:  On the day of discharge, his hemoglobin  was 12.4, hematocrit 39.6, white cells 5.5, platelets 259.  Serum  electrolytes on the day of discharge:  Sodium 134, potassium 3.8,  chloride 104, carbonate 26, BUN is 11, creatinine 1.06, glucose 95.  Because the potassium has been uniformly low  this admission and required  several supplementations, the patient goes home on 40 mEq of potassium  daily and aldactone 25 mg daily.  His INR on discharge is 2.0, protime  23.6.   Greater than 45 minutes for this dictation.      [name], [place]      Donnelly Angelica, M.D.  Electronically Signed    GM/MEDQ  D:  02/03/2007  T:  02/03/2007  Job:  [id number]   cc:   Deboraha Sprang, MD, [place] [name], MD  Rosanne Sack, ACNP

## 2010-09-02 NOTE — Assessment & Plan Note (Signed)
[name]   [name], [name]                        MRN:          [id number]  DATE:08/20/2006                            DOB:          08-01-1970    [name] was seen today in the clinic at the patient's request for a  syncopal episode at work. On interrogation of his device today, his  battery voltage is 3.18 with a charge time of 7.9 seconds. There was  indeed one VF therapy shock delivered appropriately, no other episodes  noted. The patient was without any other symptoms today and was  instructed to continue his present medications and I will review this  with Dr. Caryl Comes on Tuesday of next week and if he does indeed get another  discharge over the weekend he is to go to the hospital.      Alma Friendly, LPN  Electronically Signed      Deboraha Sprang, MD, [facility]  Electronically Signed   PO/MedQ  DD: 08/20/2006  DT: 08/20/2006  Job #: [id number]

## 2010-09-02 NOTE — Assessment & Plan Note (Signed)
[facility]                          CHRONIC HEART FAILURE NOTE   [name], [name]                        MRN:          [id number]  DATE:10/07/2006                            DOB:          Jul 21, 1970    [name] returns today for follow up of his congestive heart failure,  which is secondary to non-ischemic cardiomyopathy status post sudden  cardiac arrest in 1999. The patient underwent successful implantation of  a cardio defibrillator placement with recent generator change out in  2006 with a Guidant Vitality AG ICD. I initially saw [name] as a new  patient back in April, at which time I initiated his heart failure  education. He states that he has been doing quite well since that time.  Other than one episode, May 2nd, he was sitting at his desk at work and  stated that he felt the room grow very warm. He shut his eyes for a  moment, and the next thing he knew, he was opening his eyes because all  of his co-workers were standing around him. He states that they had told  him that he had screamed and passed out. He had his device checked at  that time by Alma Friendly here in the office. There was indeed one VF  therapy shock delivered appropriately. There are no other episodes  noted. This was reviewed by Dr. Caryl Comes and no changes were made at that  time per [name] note. The patient has not had any further incidences of  his ICD firing. He denies any palpitations. He is not aware of his  chronic atrial fibrillation. He denies any orthopnea, PND, pre-syncope,  or syncopal episodes. He continues to work full time, and he also works  a part time job. He has gained about 5 pounds in the last couple of  months. He states that his appetite has been very good and he has not  been as active outside of work as he is normally is. Coumadin continues  to be therapeutic managed by the Coumadin clinic here. He states  compliance with his medications and  tolerated his Coreg dose without  problems.   PAST MEDICAL HISTORY:  1. Post-congestive heart failure secondary to non-ischemic      cardiomyopathy, most recent EF 25-30% by TE during recent      hospitalization.  2. Ongoing atrial fibrillation with chronic anticoagulation therapy.  3. Status post TE with possible small echo dense thrombus in the tip      of the left atrial appendage (patient being maintained on Coumadin      therapy).  4. History of medical non-compliance.  5. History of aborted sudden cardiac death status post placement of a      guidant ICD.  6. Severe tricuspid valve regurgitation.  7. Severe mitral valve regurgitation.  8. Status post cardiac catheterization. Right heart cath March 2008      showing severe biventricular congestive heart failure with marked      fill and pressures.  9. Most recent stress Myoview in March 2006 positive for scarring at  the apex with no evidence for ischemia.  10.Recurrent polymorphic ventricular tachycardia and ventricular      fibrillation with appropriate shock therapy in the past.  11.Hypertension.   REVIEW OF SYSTEMS:  As stated above.   CURRENT MEDICATIONS:  1. Lanoxin 0.125 mg daily.  2. Prinvil 40 mg daily.  3. K-Dur 20 mEq b.i.d.  4. Coumadin per Coumadin clinic.  5. Furosemide 40 mg b.i.d.  6. Coreg 25 mg b.i.d.   PHYSICAL EXAMINATION:  VITAL SIGNS:  Weight 185 pounds, weight is up 5  pounds from March. Blood pressure 130/88 in the right arm, 130/82 in the  left arm. The blood pressures were manual. Heart rate 68.  GENERAL:  [name] is in no acute distress. He is as usual pleasant  and well groomed.  NECK:  He has no jugular vein distension at 45 degree angle.  LUNGS:  Clear to auscultation bilaterally.  CARDIOVASCULAR:  Reveals an S1 and S2 with a faint systolic ejection  murmur.  ABDOMEN:  Soft and nontender, positive bowel sounds.  LOWER EXTREMITIES:  Without cyanosis, clubbing, or edema.   NEUROLOGIC:  Alert, and oriented x3.   IMPRESSION:  Stable heart failure at this time, no signs of volume  overload. The patient's blood pressure is mildly elevated. I am going to  have him start tracking his blood pressure at home, recording it, and  bringing it with him at the next appointment. May need to titrate  medications further. I have also asked the patient to start an exercise  program. He needs to be walking 4 days a week. Start out at 10-15  minutes at a time, adjust it as he feels appropriate. I am going to  check labs on the patient today. His last set was done in hospital in  April. We will check a BMET and a BNP. I am going to have Dr. Haroldine Laws  review the patient's most recent echocardiogram and valve function, as I  do not recall hearing a murmur on him at his initial visit, and no  murmur clearly documented in the patient's previous visits here. I will  also review the patient's medications with Dr. Haroldine Laws and adjust for  blood pressure control if needed.   EP DOCTOR:  Dr. Virl Axe.      [name], [name]  Electronically Signed      Shaune Pascal. [name], MD  Electronically Signed   MB/MedQ  DD: 10/07/2006  DT: 10/08/2006  Job #: [id number]

## 2010-09-02 NOTE — Assessment & Plan Note (Signed)
[place] OFFICE NOTE   TIRAS, THREATS                        MRN:          [id number]  DATE:05/02/2007                            DOB:          [birth year]    Mr. Bois comes in feeling much better in his atrial fibrillation which  he is now holding sinus rhythm because of the Tikosyn.   His medications are notable for Coreg 37.5 b.i.d. Interestingly his  electrocardiogram  from November and again today shows significant  bradycardia with ventricular pacing.   PHYSICAL EXAMINATION:  VITAL SIGNS:  His blood pressure was 128/68, his  pulse was 60.  LUNGS:  Clear.  HEART:  Heart sounds were regular.  EXTREMITIES:  Without edema.  SKIN:  Warm and dry.   Electrocardiogram  dated today demonstrated sinus rhythm with  ventricular pacing. His echo Friday demonstrated some improvement in his  left ventricular function now at 35% being read as moderate to markedly  decreased as opposed to severely decreased.   IMPRESSION:  1. Nonischemic cardiomyopathy.  2. Aborted sudden cardiac death.  3. Status post ICD for the above.  4. Ventricular tachycardia with appropriate therapy via his ICD.  5. Atrial fibrillation with inappropriate therapy.  6. Tikosyn therapy.  7. Bradycardia.   We will plan to down titrate his Coreg from 37.5 to 25. We will have to  keep an eye on his rate as he may need another lead.   We will see him again in 3 months' time in the device clinic and I will  see him in 6 months' time. Will plan to check a potassium and magnesium  today because of his Tikosyn.     Deboraha Sprang, MD, [facility]  Electronically Signed    SCK/MedQ  DD: 05/02/2007  DT: 05/03/2007  Job #: [id number]

## 2010-09-02 NOTE — Assessment & Plan Note (Signed)
[place] OFFICE NOTE   DARREK, ROHLIK                        MRN:          [id number]  DATE:01/14/2007                            DOB:          Jan 15, 1971    Mr. Shellman has a history of aborted sudden cardiac death.  He comes in  today because he got shocked again the other day while driving.  He had  a warm sensation, felt some palpitations, and then got shocked with  resolution of symptoms.  On interrogation of his device last week he had  long short initiated polymorphic ventricular tachycardia that was  appropriately terminated by his device.  Notably, he also had 2 earlier  episodes both in June, both again long short couple of these  nonsustained.   Functionally, he is doing pretty well.  He is able to climb stairs and  able to walk without significant limitations.   He was admitted in March and was found to be in atrial fibrillation with  a rapid ventricular response and congestive symptoms.  He underwent TEE  which demonstrated a possible left atrial appendage clot and  cardioversion was withheld.  His Coumadin was initiated that was 6  months ago.   CURRENT MEDICATIONS:  1. Lanoxin 0.125.  2. Prinivil 40.  3. Coumadin.  4. Furosemide.  5. Coreg is now at 37.5 b.i.d.   PHYSICAL EXAMINATION:  VITAL SIGNS:  His blood pressure today was  113/71.  His pulse was 77.  His weight was 180 which is down about 5  pounds since June.  LUNGS:  Clear.  NECK:  Veins were less than 7-cm.  HEART:  Sounds were irregular with a 3/6 holosystolic murmur the  intensity of which did not vary with variable RRs.  ABDOMEN:  Mildly protuberant.  Bowel sounds were active.  EXTREMITIES:  Had trace edema.  Capillary refill was good.   Interrogation of his defibrillator demonstrated that he was in atrial  fibrillation with a rapid ventricular response.  His mean rates were  about 90 plus and 30-40% of his beats  were faster than 100 beats per  minute.   IMPRESSION:  1. Nonischemic cardiomyopathy.  2. Atrial fibrillation with an inadequately controlled ventricular      response.  3. History of aborted sudden cardiac arrest with recurrent polymorphic      ventricular tachycardia with long short initiation.  4. Status post implantable cardioverter-defibrillator.  5. Severe mitral regurgitation.   Mr. Schmuhl needs to undergo cardioversion.  This will help in a couple  of ways, one it may help functional capacity; two if we are able to  sustain sinus rhythm the irregular RR intervals which triggered his  polymorphic ventricular tachycardia might be eliminated.  There may also  be some potential benefits in terms of reversing his cardiomyopathic  process which would help with chamber size and hopefully reduction in AV  valvular regurgitation.   We will plan to set this up.  I see as I dictate after the patient has  left that he had wanted this done in mid October.  I  will call him and  encourage to do this more promptly than that.     Deboraha Sprang, MD, [facility]  Electronically Signed    SCK/MedQ  DD: 01/14/2007  DT: 01/14/2007  Job #: [id number]

## 2010-09-02 NOTE — H&P (Signed)
NAME:  [name] NO.:  [id number]   MEDICAL RECORD NO.:  [id number]          PATIENT TYPE:  INP   LOCATION:  2022                         FACILITY:  [facility]   PHYSICIAN:  Carlena Bjornstad, MD, FACCDATE OF BIRTH:  [birth year]   DATE OF ADMISSION:  01/28/2007  DATE OF DISCHARGE:                              HISTORY & PHYSICAL   Primary cardiologist is Shaune Pascal. [name], MD.  Being seen by Carlena Bjornstad, MD, today.  He currently has no primary care physician.  EP is  Champ Mungo. Lovena Le, MD.   [name] presents to [facility] today for admission for  Tikosyn loading in the setting of atrial fib.  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  He also has a history of  resuscitated sudden cardiac death, status post placement of a single-  chamber defibrillator.  He has also undergone a cardiac catheterization  in the past (right heart catheterization that showed severe  biventricular congestive heart failure with markedly elevated filling  pressures).  [name] has been followed in the heart failure clinic.  He continues to remain symptomatic in regards to his heart failure and  atrial fibrillation.  Attempted cardioversion without success.  Mr.  Timbers also has had episodes of near-syncope with fluttering sensation.  Device clinic interrogation of his ICD recently showed an episode of  ventricular fibrillation which was defibrillated, and there were four  further episodes that were aborted due to spontaneous conversion back to  sinus rhythm.  He was recently seen by Dr. Haroldine Laws in the office  setting.  A 12-lead EKG showed atrial fibrillation with diffuse  nonspecific ST-T wave abnormalities with occasional PVCs.  Dr. Haroldine Laws  spoke with Dr. Lovena Le concerning the patient's ongoing symptoms and the  possibility of worsening heart failure in the setting of the  ventricular  fibrillation and atrial fibrillation.  The patient is currently being  anticoagulated with Coumadin as a previous TEE showed a left atrial  appendage thrombus.  Dr. Lovena Le was in agreement that the patient could  benefit from antiarrhythmic therapy and it was decided to proceed with  Tikosyn therapy for further management.   PAST MEDICAL HISTORY:  1. Congestive heart failure secondary to nonischemic cardiomyopathy      with an EF of 25-30% by TEE during recent hospitalization.  2. Ongoing atrial fibrillation with chronic anticoagulation therapy.  3. Status post TEE with possible small echodense thrombus in the tip      of the left atrial appendage.  4. History of medical noncompliance.  5. History of aborted sudden cardiac death, status post placement of a      Guidant ICD.  6. Severe tricuspid valve regurgitation.  7. Severe mitral valve regurgitation.  8. Status post cardiac catheterization (right heart catheterization)      March 2008 showing severe biventricular congestive heart failure      with marked filling and pressures.  9. Status post recent stress Myoview in March 2006 positive for  scarring at the apex with no evidence of ischemia.  10.Recurrent polymorphic ventricular tachycardia and ventricular      fibrillation with appropriate shock therapy in the past.  11.Hypertension.   REVIEW OF SYSTEMS:  Positive for palpitations, dyspnea on exertion,  shortness of breath.   CURRENT MEDICATIONS:  1. Lanoxin 0.125 mg daily.  2. Prinivil 40 mg daily.  3. K-Dur 20 mEq daily.  4. Lasix 40 mg b.i.d.  5. Coreg 37.5 mg b.i.d.  6. Coumadin per Coumadin clinic.   SOCIAL HISTORY:  He lives in [place] alone.  He is a Advertising copywriter.  He has no children, is not married.  He denies any  tobacco, ETOH, drug or herbal medication use.  He has a very sedentary  lifestyle, does not exercise.  He tries to follow a heart-healthy diet.   FAMILY HISTORY:   Noncontributory.   REVIEW OF SYSTEMS:  As stated above.   PHYSICAL EXAM:  Temperature 98.3, pulse is 85 and irregular,  respirations 18, blood pressure 123/84, saturating 99% on room air.  [name] is in no acute distress.  HEENT: Normal.  NECK:  Supple.  No jugular vein distention.  CARDIOVASCULAR:  An irregularly irregular S1 andS2 with a 3/6 systolic  ejection murmur noted with a prominent S3.  LUNGS:  Clear to auscultation.  ABDOMEN:  Soft, nontender, positive bowel sounds.  LOWER EXTREMITIES:  Without clubbing, cyanosis or edema.  NEUROLOGIC:  He is alert and oriented x3.  Cranial nerves II-XII grossly  intact.   At this time chest x-ray, EKG and lab work is pending.   IMPRESSION:  Atrial fibrillation in the setting of nonischemic  cardiomyopathy with ejection fraction of 25-30%.  The patient admitted  for Tikosyn loading/monitoring, also with history of ventricular  tachycardia, status post aborted sudden cardiac arrest in the past, with  placement of a Guidant implantable cardioverter-defibrillator.  Appropriate lab work has been ordered.  Pending result of lab work, will  plan on initiating Tikosyn therapy.  Dr. Daryel November in to examine and  assess patient, agrees with plan of care.      Rosanne Sack, ACNP      Carlena Bjornstad, MD, [facility]  Electronically Signed    MB/MEDQ  D:  01/28/2007  T:  01/29/2007  Job:  [id number]

## 2010-09-02 NOTE — Assessment & Plan Note (Signed)
[name] OFFICE NOTE   [name], [name]                        MRN:          [id number]  DATE:03/10/2007                            DOB:          [birth year]    HISTORY OF PRESENT ILLNESS:  [name] is a 40 year old male.  He has a  history of nonischemic cardiomyopathy.  He had echocardiogram September  2008 with an ejection fraction of 25-30%.  He was, at that time, rated  as New York Heart Associated Class III congestive heart failure.  He was  seen by Dr. Caryl Comes with complaints of palpitation and presyncope and  thought to have perhaps a tachycardia mediated cardiomyopathy secondary  to his frequent PVCs and atrial fibrillation.  He was brought to [facility] October 10 through October 16 for Tikosyn therapy  converted after four doses spontaneously.  Did not require DC  cardioversion.  Presents approximately one month later.  Says he has not  been having any dizzy spells or palpitations.  He does not complaint of  fatigue and is doing as much activity and much more than before his  admission in September.  His New York Heart Associated Class can be  rated a class II chronic systolic congestive failure.  The patient has  been compliant with taking Tikosyn.  His electrocardiogram, today,  November 20, shows sinus rhythm with very occasional paced beats and  very occasional ventricular ectopy.   l  As for background, the patient has a nonischemic cardiomyopathy.  He  has a history of resuscitated sudden cardiac death and is status post  single-chamber cardioverter defibrillator.  He has severe mitral  regurgitation, and of course, has atrial fibrillation.  In the device  clinic in September, his ICD was interrogated and showed one episode of  ventricular fibrillation which was defibrillated.  There were 4 or 5  other episodes which were aborted due to spontaneous conversion back to  sinus  rhythm.  The patient's medications currently are Lanoxin 0.25 mg  one half tablet daily, Prinivil 40 mg daily, potassium chloride 20 mEq  b.i.d., Coumadin 5 mg daily except for 7.5 mg Wednesday, Furosemide 40  mg b.i.d., Coreg 37.5 mg b.i.d. and Tikosyn 500 mcg every 12 hours.  With plan going forward, the patient will have echocardiogram on April 29, 2007, to assess ejection fraction, and he will follow up with Dr.  Caryl Comes May 02, 2007.   PHYSICAL EXAMINATION:  GENERAL:  Alert and oriented x3.  Quite happy  with his functional status.  HEENT:  Pupils equal, round and reactive to light . Extraocular  movements intact.  NECK:  Supple.  No carotid bruits auscultated.  HEART:  Regular rate and rhythm.  LUNGS:  Clear to auscultation.  Somewhat dim however, but no rales or  rhonchi.  ABDOMEN:  Soft, nondistended.  Bowel sounds present.  There was no  evidence of lower  extremity edema.  The patient will follow up as dictated above.      [name] Margarita, PA  Electronically Signed      [name] Sprang,  MD, [facility]  Electronically Signed   GM/MedQ  DD: 03/10/2007  DT: 03/11/2007  Job #: [id number]

## 2010-09-02 NOTE — Assessment & Plan Note (Signed)
[facility]                          CHRONIC HEART FAILURE NOTE   YONASON, FICHTER                        MRN:          [id number]  DATE:08/12/2006                            DOB:          11/30/1970    [name] is a pleasant 40 year old African American gentleman who is  new to my heart failure clinic.  He is previously diagnosed with  congestive heart failure secondary to nonischemic cardiomyopathy back in  1999 status post a sudden cardiac arrest.  At that time, the patient  underwent placement of a defibrillator by Dr. Caryl Comes.  He underwent  successful cardio defibrillator generator change out in 2006 with a  [employer] ICD.  Mr. Wadding has been very lacks in his medical  care with frequent lapses in appointments.  He had not actually been  seen here in the clinic since April of 2006 when he was admitted to  [facility] in March of 2006 for acute on chronic congestive heart  failure.  During that time he underwent a right heart catheterization by  Dr. Haroldine Laws revealing severe biventricular congestive heart failure  with marked elevated fill in pressures.  The patient was treated with IV  Lasix with improvement in symptoms.  During that hospitalization he  underwent a TEE with a ejection fraction of 25-30% diffuse left  ventricular hypokinesis, severe mitral valvular regurgitation.  There  appeared to be a small echo dense thrombus at the tip of the left atrial  appendage.  The patient was also found to have severe tricuspid valvular  regurgitation.  Mr. Rehkop was already on Coumadin secondary to his  chronic atrial fibrillation.  He was stable enough to be discharged on  April the 6th.  Mr. Majid states that he has been feeling much better  since he was discharged home.  States he is compliant with his  medications.  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  No firing  from his defibrillator.   PAST MEDICAL HISTORY:  1. Congestive heart failure secondary to nonischemic cardiomyopathy.      Most recent ejection fraction calculated at 25-30% by TEE during      recent hospitalization.  2. Ongoing atrial fibrillation, being maintained on Coumadin therapy.  3. Status post TEE with possible small echo dense thrombus in the tip      of the left atrial appendage.  4. History of medical non-compliance.  5. History of abort sudden cardiac death status post placement of a      guided defibrillator.  6. Severe tricuspid valvular regurgitation.  7. Severe mitral valvular regurgitation.  8. Status post cardiac catheterization      a.     Right heart catheter July 19, 2006 showing severe       biventricular congestive heart failure with marked  fill in       pressures.      b.     Most recent stress Myoview March 2006 positive for scarring       at the apex with no evidence for ischemia.  9. Recurrent polymorphic ventricular tachycardia and ventricular      fibrillation with appropriate shock therapy in the past.  10.Hypertension.   REVIEW OF SYSTEMS:  As stated above.   CURRENT MEDICATIONS:  1. Lanoxin 0.25 mg daily.  2. Prinivil 40 mg.  3. K-Dur 20 m Eq b.i.d.  4. Coumadin per Coumadin clinic.  5. Furosemide 80 mg b.i.d.  6. Coreg 25 mg b.i.d.   Most recent lab work during hospitalization, hemoglobin and hematocrit  13.3 and 41.5, PT 28.2, INR 2.5, potassium 4.0, BUN 14, creatinine 0.9,  digoxin level 0.3, BNP 827.   Most recent EKG during hospitalization revealed the man pacing  ventricular rate of 71 with underlying atrial fibrillation.   PHYSICAL EXAMINATION:  Weight 176, blood pressure 130/82 with a pulse of  70.  Mr. Drumwright is in no  acute distress.  He is a very pleasant 40 year old  African American gentleman well groomed.  He has no jugular vein distension at a 45 degree angle.  LUNGS:  Clear to auscultation bilaterally.  CARDIOVASCULAR:  S1 and S2 are regular rate and rhythm.  ABDOMEN:  Soft and nontender, positive bowel sounds.  LOWER EXTREMITIES:  Without clubbing, cyanosis or edema.  NEUROLOGICALLY:  The patient is awake and oriented x3, cranial nerves 2  through 12 grossly intact.   IMPRESSION:  Stable heart failure at this time, no signs of volume  overload.  The patient tolerating medications without problems.  I have  spent greater than 30 minutes time with patient initiating heart failure  education and reinforcing the need for patient ongoing compliance in  keeping appointments and communicating with his medical team, changes in  his status.  He has been very lacks in following his health care in the  past as he states he has been doing okay and does not feel like he needs  to see a doctor.  However, with his medical history and his young age it  would behoove to him to stay on top of his medical issues now so that he  may continue to work full time and maintain a good quality of life.  I  am not sure that he fully comprehends the importance of following his  medical status closely.  I hope that I have enlightened him today.  The  only changes I have made in his medication at this time, I decreased his  digoxin to 0.125 mg.  We will continue his other medications and see him  back in a month for re-evaluation.  I have given him a heart failure  education packet.  He knows to call me if he has any problems.  The patient's EP doctor is Dr. Caryl Comes.  He does not have a primary care  physician listed at this time.      Rosanne Sack, ACNP  Electronically Signed      Minus Breeding, MD, [facility]  Electronically Signed  MB/MedQ  DD: 08/13/2006  DT: 08/13/2006  Job #: [id number]

## 2010-09-05 NOTE — H&P (Signed)
NAME:  [name], [name] NO.:  [id number]   MEDICAL RECORD NO.:  [id number]          PATIENT TYPE:  INP   LOCATION:  [id number]                         FACILITY:  [facility]   PHYSICIAN:  Deboraha Sprang, MD, FACCDATE OF BIRTH:  November 09, 1970   DATE OF ADMISSION:  07/16/2006  DATE OF DISCHARGE:                              HISTORY & PHYSICAL   CHIEF COMPLAINT:  Abdominal swelling.   HISTORY OF PRESENT ILLNESS:  [name] is a very pleasant 40 year old  male with a history of a nonischemic cardiomyopathy, history of aborted  sudden cardiac death, status post ICD placement, who presents with  increased abdominal distention.  Over the last couple of days, the  patient says that his belly has been swollen more.  He also has  complaints of shortness of breath when he lies down.  He does not  complain of shortness of breath with exertion, has no chest pain, has no  cough, no lower extremity swelling.  The patient denies palpitations.  He has had no syncope or presyncope.  He has had no ICD shocks.  He has  no complaints of fevers, chills or night sweats.   PAST MEDICAL HISTORY:  1. Nonischemic cardiomyopathy.  2. History of aborted sudden cardiac death.  3. History of VT/DF, status post ICD placement.  4. Hypertension.  5. Gout.   MEDICINES:  1. Lasix 80 mg p.o. b.i.d.  2. Coreg 25 mg p.o. b.i.d.  3. Coumadin 5 mg on Monday/Wednesday/Friday, 7.5 mg every other day of      the week.  4. Digitek 0.25 mg daily.  5. Lisinopril 40 mg p.o. daily.   ALLERGIES:  No known drug allergies.   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   FAMILY HISTORY:  Noncontributory.   REVIEW OF SYSTEMS:  All systems are reviewed in detail and are negative  except as noted in the history of present illness.   PHYSICAL EXAMINATION:  VITAL SIGNS:  Heart rate in the one-teens,  temperature 98.8, blood pressure 186/74, respiratory rate 18.  GENERAL:   A well-developed, well-nourished male, alert and oriented x3,  in no apparent distress.  HEENT:  Atraumatic, normocephalic.  Pupils are equal, round and reactive  to light.  Extraocular movements intact.  Oropharynx clear.  NECK:  Supple with no adenopathy, no JVD, no carotid bruits.  CHEST:  Lungs clear to auscultation bilaterally with equal bilaterally  breath sounds.  CORONARY:  Irregularly irregular, tachycardic, 2/6 holosystolic murmur  heard best at the axilla.  ABDOMEN:  Soft, slightly distended, nontender.  Active bowel sounds.  EXTREMITIES:  No clubbing, cyanosis, or edema.  NEUROLOGIC:  No focal deficits.   LABORATORY DATA:  BNP 684.  Sodium 139, potassium 3, chloride 103,  bicarb 27, BUN 18, creatinine 1.4, glucose 117.  Hematocrit 47.   EKG shows atrial fibrillation with a rapid ventricular response,  frequent PVCs, nonspecific ST- and T-wave changes.   IMPRESSION AND PLAN:  Congestive heart failure, atrial fibrillation,  rapid ventricular response, question if this is new or not,  mitral  regurgitation on examination.   PLAN:  1. Cardiovascular:  Admit the patient to Telemetry, rule out with      serial cardiac enzymes, continuation of Coreg, Digitek and      Coumadin.  We will transfer his Lasix to IV dosing, further rate      control with diltiazem.  The patient needs echocardiogram to      evaluate mitral regurgitation.  Daily EKGs.  Strict I's and O's and      daily weights.  2. Endocrine:  Thyroid function tests.  3. Fluids, electrolytes and nutrition:  N.p.o. in case his INR is      therapeutic and cardioversion could be an option, replete      potassium, check magnesium level.  4. Hematologic:  Check INR, CBC.      [name] Mould, MD   Electronically Signed     ______________________________  Deboraha Sprang, MD, [facility]    TRK/MEDQ  D:  07/17/2006  T:  07/17/2006  Job:  [id number]

## 2010-09-05 NOTE — Op Note (Signed)
NAME:  [name], [name] NO.:  [id number]   MEDICAL RECORD NO.:  [id number]          PATIENT TYPE:  OIB   LOCATION:  2869                         FACILITY:  [facility]   PHYSICIAN:  Deboraha Sprang, M.D.  DATE OF BIRTH:  23-Mar-1971   DATE OF PROCEDURE:  07/09/2004  DATE OF DISCHARGE:                                 OPERATIVE REPORT   PREOPERATIVE DIAGNOSES:  1.  Aborted sudden cardiac death.  2.  Previously implanted defibrillator, now end of life.   POSTOPERATIVE DIAGNOSES:  1.  Aborted sudden cardiac death.  2.  Previously implanted defibrillator, now end of life.   PROCEDURE:  Defibrillator generator replacement and intraoperative  defibrillation threshold testing.   SURGEON:  Deboraha Sprang, M.D.   DESCRIPTION OF PROCEDURE:  Following the attainment of informed consent, the  patient was brought to the electrophysiology laboratory and placed on the  fluoroscopy table in a supine position.  After routine prep and drape,  lidocaine was infiltrated along the line of the previous incision and  carried down to the layer of the device pocket using sharp dissection and  electrocautery.  The pocket was opened up with great care.  The pocket  turned out serendipitously and beneficially to be located about 1 cm caudal  to the incision line.  There was a significant amount of scarring and the  leads could not be well-freed-up.   The previously implanted system included a model 0144 dual-coil  defibrillator lead, the proximal coil of which was not part of the system.  The previously implanted subcutaneous array, model 0049, serial [id number], and  this was plugged into the proximal port; the atrial port was previously  plugged.  The leads were freed up and through the PSA, the bipolar R wave  was 22 mV with a pacing impedance of 740 ohms and a threshold of 0.4 volts  at 0.5 msec.  The high voltage impedance was 43 ohms.   With these acceptable parameters recorded, the  device pocket was extended  marginally caudally and cephalad and the leads were further freed up to  allow for movement of the leads to be able to be attached to the device,  which had a longer longitudinal axis.   Through the device, the leads were then attached to a [place]  ICD model 2180, serial [id number].  The atrial port was plugged through the  device.  The bipolar R wave was 18.3 mV with a pacing impedance of 1717 ohms  and threshold of 0.4 volts at 0.5 msec.  With these acceptable parameters  recorded, defibrillation threshold testing was undertaken.  Ventricular  fibrillation was induced via the T wave shock.  After a total duration of  6.5 seconds, a 23-joule shock was delivered through reversed polarity at a  measured impedance of 44 ohms, terminating ventricular fibrillation and  restoring sinus rhythm.  The pocket was then copiously irrigated with  antibiotic-containing saline solution.  Hemostasis was assured in the lead  and the pulse generator was placed in the pocket.  The wound was then closed in 3 layers  in the normal fashion.  The wound was  washed, dried and a Benzoin and Steri-Strip dressing was applied.  Needle  counts, sponge counts and instrument counts were correct at the end of the  procedure according to staff.  The patient tolerated the procedure without  apparent complication.      SCK/MEDQ  D:  07/09/2004  T:  07/09/2004  Job:  [id number]   cc:   [facility]   [facility]

## 2010-09-05 NOTE — Op Note (Signed)
[place]  Patient:    [name], [name]                      MRN: [id number] Proc. Date: 08/24/00 Adm. Date:  [id number] Disc. Date: [id number] Attending:  Feliz Beam CC:         Ramond Dial., M.D.  Electrophysiology Lab  [employer] Office: Attn: Manus Rudd.   Operative Report  PREOPERATIVE DIAGNOSES: 1. Aborted sudden cardiac death. 2. Successful implantable cardioverter-defibrillator implantation now at    end-of-life.  POSTOPERATIVE DIAGNOSES: 1. Aborted sudden cardiac death. 2. Successful implantable cardioverter-defibrillator implantation now at    end-of-life.  OPERATION:  Explantation of a previously implanted defibrillator with reimplantation.  Intraoperative defibrillation threshold testing.  SURGEON:  Nikki Dom, M.D., [facility]  DESCRIPTION OF PROCEDURE:  Following the attainment of informed consent, the patient was brought to the electrophysiology laboratory and placed on the fluoroscopic table in the supine position.  The patient was sedated to anesthesia under the control of Dr. Wynetta Emery.  Lidocaine was infiltrated along the line of the previous incision, and it was carried down to the layer of the prepectoral pocket using blunt dissection with electrocautery.  The previously implanted defibrillator was explanted. The patient had a dual venous system including the subcu, Array, model 0049, serial number [id number] and a dual coiled defibrillator lead 0144, serial number [id number].  Upon exposure of the lead, the bipolar R-wave was 19.8 mV with patient impendance of 420 ohms and patient threshold of 0.5 msec at 1.9 V and endocardial threshold of 2.7 mA.  The impendance pacing through the proximal coil was 660 and through the distal coil was 646, and we also paced through the Array with a number that I do not have recorded at this time.  Lead and wall parameters were recorded, and the lead was then  attached to a Guidant 1853 DR high energy ICD model [id number].  Through the device the bipolar R-wave was 16.3 mV and impendance of 692 ohms.  The patient threshold was 1.8 V at 0.5 msec, and a high voltage impendance is measured to be 40.  With these acceptable parameters recorded, defibrillation threshold testing was undertaken.  Ventricular fibrillation was induced via the T-wave shock. After a total duration of 10.5 seconds a 21 joule shock was delivered through a measured resistance of 41 ohms to a reinvent ventricular fibrillation and resulting atrial fibrillation.  This was done with reverse polarity in the proximal lead not attached to the defibrillator.  Please note that the previously described pacing impendances were 646 for the distal coil as noted and 660 ohms for the subcu Array as previously attributed to the proximal lead.  The patient was then shocked externally at 200 joules to restore sinus rhythm. The pocket was copiously irrigated with antibiotic containing saline solution. Leads and pulse generator were placed in the pocket with care given to eliminate any sharp bends in the wire, and the pocket was then closed in three layers in a normal fashion.  The wound was washed and Benzoin and Steri-Strips were then applied.  Needle counts, sponge counts and instrument counts at the end of the procedure according to the staff.  The patient tolerated the procedure well, and was sent to the PACU.DD:  09/08/00 TD:  09/09/00 Job: [id number]

## 2010-09-05 NOTE — Cardiovascular Report (Signed)
NAME:  [name], [name] NO.:  [id number]   MEDICAL RECORD NO.:  [id number]          PATIENT TYPE:  INP   LOCATION:  2927                         FACILITY:  [place]   PHYSICIAN:  Shaune Pascal. [name], MDDATE OF BIRTH:  09-11-1970   DATE OF PROCEDURE:  07/19/2006  DATE OF DISCHARGE:                            CARDIAC CATHETERIZATION   PATIENT IDENTIFICATION:  [name] is a very pleasant 40 year old male,  with a history of nonischemic cardiomyopathy as well as a history of  aborted sudden cardiac death, who was admitted on the 28th with  worsening heart failure.  He was found to be in atrial fibrillation with  rapid ventricular response.  He has been responding poorly to diuresis  and has had borderline blood pressure and worsening renal insufficiency.  Prior to catheterization, he underwent TEE which showed an ejection  fraction of 25-30% with severe MR and TR.  There was probable left  atrial appendage thrombus so a cardioversion was not performed.  He was  brought to the cardiac catheterization lab to evaluate his filling  pressures.   PROCEDURES PERFORMED:  Right heart catheterization.   DESCRIPTION OF PROCEDURE:  The risks and benefits of catheterization  were explained to the patient as well as his brother.  Consent was  signed and placed on the chart.  A 7-French venous sheath was placed in  the right femoral vein using a modified Seldinger technique.  A standard  right heart catheterization was performed with a Swan-Ganz catheter.  There was no apparent complications.   FINDINGS:  Right atrial pressure mean of 19, RV pressure 58/14.  PA  pressure 55/34 with a mean of 43.  Pulmonary capillary wedge pressure  mean of 34.  Fick cardiac outputs 3.5 liters per minute.  Cardiac index  was 1.8 liters per minute per meter squared.  Pulmonary vascular  resistance was 2.5 Woods units.   ASSESSMENT:  Severe biventricular congestive heart failure with markedly  elevated filling pressures.   PLAN/DISCUSSION:  Given his markedly elevated filling pressures and  borderline blood pressure with worsening renal insufficiency, we will  start him on milrinone for inotropic support and also start a Lasix  drip.  We will increase his digoxin.  We will attempt to wean his  diltiazem as his heart rate tolerates in order to limit the negative  inotropy.  We will follow him in the cardiac step-down unit.      Shaune Pascal. [name], MD  Electronically Signed     DRB/MEDQ  D:  07/19/2006  T:  07/19/2006  Job:  [id number]

## 2010-09-05 NOTE — Discharge Summary (Signed)
NAME:  LAROY, PINTA NO.:  [id number]   MEDICAL RECORD NO.:  [id number]          PATIENT TYPE:  INP   LOCATION:  2927                         FACILITY:  [place]   PHYSICIAN:  Marijo Conception. Wall, MD, FACCDATE OF BIRTH:  [birth year]   DATE OF ADMISSION:  07/16/2006  DATE OF DISCHARGE:  07/25/2006                               DISCHARGE SUMMARY   PRIMARY CARDIOLOGIST:  Dr. Vita Barley.   PRIMARY CARE PHYSICIAN:  Not listed.   PROCEDURES PERFORMED DURING HOSPITALIZATION:  Right heart  catheterization dated July 19, 2006, performed by Dr. Glori Bickers,  revealing severe biventricular congestive heart failure with markedly  elevated filling pressures, right atrial pressure mean of 19, RV  pressure 58/14, PA pressure 55/34 with a mean of 43, pulmonary capillary  wedge pressure mean of 34, thick cardiac output 3.5 liters per minute.  Cardiac index was 1.8 liters per minute per meter squared.  Pulmonary  vascular resistance was 2.5 Woods units.   DISCHARGE DIAGNOSES:  1. Acute on chronic non ischemic cardiomyopathy.  2. History of aborted sudden cardiac death.  3. History of VTDF status post implantable cardioverter-defibrillator      placement.  4. Hypertension.  5. Gout.   HOSPITAL COURSE:  [name] is a 40 year old African-American male with  a history of non ischemic cardiomyopathy and above-mentioned diagnoses  who presents with increased abdominal distention.  Over the last couple  of days, the patient states that his abdomen has been swollen more.  He  also has complaints of increased shortness of breath when he lies down.  He did not complain of shortness of breath with exertion.  He has no  chest pain and no cough and no lower extremity swelling.  The patient  denied any palpitations, syncope, fever or chills.   The patient was admitted to telemetry by Dr. Irineo Axon, fellow with  [facility], to rule out cardiac event causing increased  abdominal distention.  Cardiac enzymes were cycled.  He was returned to  his current medication regimen.  He was started on IV Lasix and had rate  control with diltiazem.   The patient had a transesophageal echo on July 19, 2006, revealing  overall left ventricular systolic function with moderately to markedly  decreased left ventricular ejection fraction, with estimated range being  25% to 30%.  There was diffuse left ventricular hypokinesis.  There was  trivial aortic valvular regurg.  There was severe mitral valvular  regurg.  The left atrium was markedly dilated.  There appears to be a  small echo-dense thrombus at the tip of the left atrial appendage.  Estimated peak pulmonary artery systolic pressure was markedly  increased.  There was severe tricuspid valvular regurg.  The right  atrium was markedly dilated per Dr. Haroldine Laws.   The patient was admitted in the ICU throughout hospitalization.  He was  started on a milrinone infusion for assistance in cardiac output and  diuresis along with continued IV Lasix.  The patient was able to diurese  some during hospitalization with continuing monitoring of his weight and  I & O.  Secondary to limited IV access, the patient did have a PICC line  placed in the right upper arm for infusions.  The patient continued to  diurese approximately 3000 mL during hospitalization.  His rate was  controlled during hospitalization.  He has a history of AFib with RVR  and it was controlled with Digoxin.  There was some evidence of  hypokalemia during hospitalization which was repleted with followup  labs.  The patient diuresed approximately 2.2 liters throughout  hospitalization on IV Lasix and was started on p.o. Lasix the day before  discharge.  A followup transesophageal echo was not completed during  hospitalization.  There was discussion of need for DC cardioversion  secondary to questionable thrombus in the left atrial appendage.  This  was not  performed.  The patient was also continued on Coumadin  throughout hospitalization with pharmacy monitoring and is to follow up  in the Coumadin Clinic for continued monitoring of PT/INR.  His final  dose of Coumadin was 7.5 mg daily.  His last INR was 2.  The patient's  milrinone drip was discontinued on July 23, 2004.  The patient was  started on Celebrex secondary to gout-like symptoms.  He was feeling  much better with breathing easier and decreased abdominal distention.   The patient was seen and examined by Dr. Jenell Milliner on July 25, 2006,  and found to be stable for discharge.  The patient will continue on  followup with the Coumadin Clinic and Congestive Heart Failure Clinic  with Dr. Percival Spanish.  The Celebrex was continued at 200 mg a day.  The  patient will also follow up with a PT/INR at the Coumadin Clinic within  one week.   DISCHARGE LABS:  White blood cells 6.8, hemoglobin 13.3, hematocrit  41.5, platelets 242, PT 28.2, INR 2.5.  Sodium 136, potassium 4,  chloride 103, CO2 29, glucose 99, BUN 14, creatinine 0.9.  Troponin was  negative at 0.05.  CK-MB 0.9, CK 117.  Hepatitis studies were completed  with results unavailable at time of discharge.  Digoxin 0.3.  BNP on  July 23, 2006, was 827.  Chest x-ray dated July 17, 2006 revealed  stable cardiomegaly with pulmonary vascular congestion, no acute  abnormalities.  EKG revealing demand pacemaker, ventricular rate of 71  beats per minute with atrial fibrillation underlying.   DISCHARGE MEDICATIONS:  1. Carvedilol 25 mg b.i.d.  2. Lisinopril 40 mg daily.  3. Potassium 40 mEq daily.  4. Coumadin 7.5 mg daily.  To be followed by PT/INR in Coumadin Clinic      at early check next week.  5. Lanoxin 0.25 mg daily.  6. Lasix 80 mg p.o. b.i.d.  7. Celebrex 200 mg once a day (new prescription provided).   ALLERGIES:  NO KNOWN DRUG ALLERGIES ARE LISTED.   FOLLOWUP PLANS AND APPOINTMENTS: 1. The patient is to follow up in the  Coumadin Clinic early this week      for followup for PT/INR on Coumadin at 7.5 mg once a day at this      time.  2. The patient is to follow up with Dr. Percival Spanish in the Congestive      Heart Failure Clinic.  Office is to call and assist with this in      the weekend to make the appointment.   Time spent with the patient to include physician time: 35 minutes.      Phill Myron. Purcell Nails, NP      Marcello Moores  Genella Rife, MD, [facility]  Electronically Signed    KML/MEDQ  D:  07/25/2006  T:  07/26/2006  Job:  [id number]

## 2010-09-05 NOTE — Discharge Summary (Signed)
NAME:  [name], VAUGH NO.:  [id number]   MEDICAL RECORD NO.:  [id number]          PATIENT TYPE:  OIB   LOCATION:  2869                         FACILITY:  [place]   PHYSICIAN:  Deboraha Sprang, M.D.  DATE OF BIRTH:  [birth year]   DATE OF ADMISSION:  07/09/2004  DATE OF DISCHARGE:  07/09/2004                                 DISCHARGE SUMMARY   DISCHARGE DIAGNOSES:  1.  Discharging after successful cardioverter defibrillator generator change      with replacement generator as a Guidant VITALITY HE ICD model T180DDDR      by Dr. Caryl Comes.  2.  History of aborted sudden cardiac death.  3.  Recent adenosine stress Myoview study July 02, 2004, ejection fraction      32%, substantial left ventricular dilatation, apical scarring, no      ischemia.  4.  Status post implantable cardioverter defibrillator for sudden cardiac      death.  5.  Recurrent polymorphic ventricular tachycardia and ventricular      fibrillation with appropriate shock therapy.  6.  Nonischemic cardiomyopathy with variable left ventricular function over      time.   SECONDARY DIAGNOSES:  1.  Hypertension.  2.  Frequent ventricular ectopy.   PROCEDURE:  Generator change replacing previous Guidant device with Guidant  VITALITY generator leads unchanged.  No complications post procedurally.  Patient discharging same day.  Of note, the patient is scheduled for Holter  monitor to see if he has frequent ventricular ectopy which may contribute to  his ongoing cardiomyopathy.   DISCHARGE MEDICATIONS:  1.  Lanoxin 0.25 mg daily.  2.  Prinivil 40 mg daily.  3.  Potassium chloride 20 mEq twice daily.  4.  Coumadin 5 mg on Monday and Friday, 7.5 mg every other day.  5.  Furosemide 80 mg twice daily.  6.  Coreg 25 mg twice daily.   In the morning the patient is asked to remove the bandage and leave the  incision open to the air.  He need not apply anything to it.   For pain, Darvocet-N 100 one to two  tablets every three to four hours p.r.n.  pain or Tylenol 325 mg one to two tablets every four to six hours as needed.  He is to avoid strenuous activity or heavy lifting for the next two weeks.  He is asked to keep his incision dry for the next seven days.  Sponge bathe  until Wednesday, July 16, 2004.   DISCHARGE DIET:  Low sodium, low cholesterol diet.   He has follow-up with [facility] [place] at the  ICD clinic Wednesday, July 23, 2004, at 9:30 in the morning.   BRIEF HISTORY:  [name] is a 40 year old male.  He is status post  implantable cardioverter defibrillator with a generator replacement in May  2002.  Elective replacement indicator is upcoming on June 11, 2004.  The  patient has primary ventricular fibrillation in a context of nonischemic  cardiomyopathy.  He has had some near normalization of left ventricular  function but he  has recurrent ventricular fibrillation.  He has had  subsequent intercurrent deterioration in left ventricular function.  He has  had two episodes of tachycardia correction since August 2004, one was for  monomorphic ventricular tachycardia which terminated spontaneously; another  for polymorphic ventricular tachycardia which also terminated spontaneously.  The patient, at this time, needs generator replacement with polymorphic  ventricular tachycardia and a Cardiolite study will be undertaken.  In  addition, Holter monitor will be utilized to see if there is frequent  ventricular ectopy which may be contributing to cardiomyopathy.   HOSPITAL COURSE:  Presentation electively for generator change.  This was  done by Dr. Caryl Comes July 09, 2004.  There were no complications to  implantation of a Guidant VITALITY device.  Patient discharging in the post  procedure period without complications.      GM/MEDQ  D:  07/09/2004  T:  07/09/2004  Job:  [id number]

## 2010-10-14 ENCOUNTER — Other Ambulatory Visit: Payer: Self-pay | Admitting: Internal Medicine

## 2010-11-19 ENCOUNTER — Ambulatory Visit (INDEPENDENT_AMBULATORY_CARE_PROVIDER_SITE_OTHER): Payer: 59 | Admitting: Internal Medicine

## 2010-11-19 ENCOUNTER — Encounter: Payer: Self-pay | Admitting: Internal Medicine

## 2010-11-19 DIAGNOSIS — I428 Other cardiomyopathies: Secondary | ICD-10-CM

## 2010-11-19 DIAGNOSIS — I472 Ventricular tachycardia, unspecified: Secondary | ICD-10-CM

## 2010-11-19 DIAGNOSIS — I4891 Unspecified atrial fibrillation: Secondary | ICD-10-CM

## 2010-11-19 DIAGNOSIS — Z95810 Presence of automatic (implantable) cardiac defibrillator: Secondary | ICD-10-CM

## 2010-11-19 DIAGNOSIS — I4729 Other ventricular tachycardia: Secondary | ICD-10-CM

## 2010-11-19 DIAGNOSIS — I10 Essential (primary) hypertension: Secondary | ICD-10-CM

## 2010-11-19 LAB — ICD DEVICE OBSERVATION
AL IMPEDENCE ICD: 3000 Ohm
BATTERY VOLTAGE: 2.56 V
CHARGE TIME: 16.6 s
HV IMPEDENCE: 42 Ohm
RV LEAD IMPEDENCE ICD: 729 Ohm
RV LEAD THRESHOLD: 0.8 V
RV LEAD THRESHOLD: 1 V
TOT-0002: 19
TOT-0006: 20120403000000
VENTRICULAR PACING ICD: 0 pct
VF: 0

## 2010-11-19 MED ORDER — CARVEDILOL 12.5 MG PO TABS: 12.5000 mg | ORAL_TABLET | Freq: Two times a day (BID) | ORAL | Status: DC

## 2010-11-19 NOTE — Progress Notes (Signed)
HPI  [name] is a 40 y.o. male Seen in followup for  atrial fibrillation which had been  is persistent He was in sinus rhythm when seen in April. 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 He has had post implant appropriate therapy. He also has severe tricuspid regurgitation. Functional status has been quite good without significant shortness of breath or chest pain or peripheral edema.    He was hospitalized in January because of GI bleeding related to diverticulitis He recently found also to have rheumatoid arthritis.  He is doing really quite well at this time. His minimum shortness of breath or chest pain and no peripheral edema. He has lost a total of 30 pounds  Past Medical History  Diagnosis Date  . CHF (congestive heart failure)     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%)  . Arrhythmia     PERSISTENT ATRIAL FIBRILLATION WITH CHRONIC ANTICOAGULATION THERAPY...[place] LOAD 2008  . Noncompliance     H/O  MEDICAL NONCOMPLIANCE  . 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 Tricuspid valve regurgitation     SEVERE  . Severe mitral regurgitation   . Polymorphic ventricular tachycardia     RECURRENT WITH APPROPRIATE SHOCK THERAPY IN THE PAST  . Ventricular fibrillation     WITH APPROPRIATE SHOCK THERAPY IN THE PAST  . Hypertension   . Gout     Past Surgical History  Procedure Date  . Cardiac catheterization 06/2006    RIGHT HEART CATH SHOWING SEVERE BIVENTRICUALR CHF WITH MARKED FILLING AND PRESSURES  . Insert / replace / remove pacemaker     GUIDANT HE ICD MODEL 2180, SERIAL # [id number]  . Cholecystectomy     Current Outpatient Prescriptions  Medication Sig Dispense Refill  . amiodarone (PACERONE) 200 MG tablet TAKE 1 1/2 TAB EVERY DAY      . carvedilol (COREG) 6.25 MG tablet Take 6.25 mg by mouth 2 (two)  times daily with a meal.        . docusate sodium (COLACE) 100 MG capsule Take 100 mg by mouth 2 (two) times daily.        . folic acid (FOLVITE) 1 MG tablet 2 tablets Daily.      . furosemide (LASIX) 40 MG tablet        . iron polysaccharides (NIFEREX) 150 MG capsule Take 150 mg by mouth 2 (two) times daily.        Marland Kitchen lisinopril (PRINIVIL,ZESTRIL) 20 MG tablet Take 1 tablet (20 mg total) by mouth daily.  30 tablet  11  . methotrexate (RHEUMATREX) 2.5 MG tablet 5 tablets Once a week.       . potassium chloride SA (K-DUR,KLOR-CON) 20 MEQ tablet Take 1 tablet (20 mEq total) by mouth daily.  30 tablet  3  . spironolactone (ALDACTONE) 25 MG tablet Take 25 mg by mouth daily. Take 1/2 tab qd         No Known Allergies  Review of Systems negative except from HPI and PMH  Physical Exam Well developed and well nourished in no acute distress HENT normal E scleral and icterus clear Neck Supple JVP flat; carotids brisk and full Clear to ausculation Regular rate and rhythm, no murmurs gallops or rub Soft with active bowel sounds No clubbing cyanosis and edema Alert and oriented, grossly normal motor and sensory function  Skin Warm and Dry  ECG  Assessment and  Plan

## 2010-11-19 NOTE — Assessment & Plan Note (Signed)
No recurrent arrhythmia. We'll continue him on amiodarone. We'll obtain surveillance laboratories from his rheumatologist

## 2010-11-19 NOTE — Assessment & Plan Note (Signed)
No clinical recurrences of atrial fibrillation continue amiodarone; He is not on warfarin because of the GI bleeding

## 2010-11-19 NOTE — Assessment & Plan Note (Signed)
Will increase his Coreg as noted

## 2010-11-19 NOTE — Assessment & Plan Note (Signed)
Will incerease his coreg to 12.5   His BP is elevated.  We wil try  And get him back to 25 mg bid

## 2010-11-19 NOTE — Assessment & Plan Note (Signed)
The patient's device was interrogated.  The information was reviewed. No changes were made in the programming.    

## 2010-11-19 NOTE — Patient Instructions (Signed)
Your physician has recommended you make the following change in your medication:  1) Increase Coreg (carvedilol) to 12.5mg  one tablet by mouth twice daily.  Your physician recommends that you schedule a follow-up appointment in: 4 weeks with Richardson Dopp, PA & 3 months with Nevin Bloodgood & Erasmo Downer

## 2010-11-20 ENCOUNTER — Encounter: Payer: Self-pay | Admitting: Internal Medicine

## 2010-12-17 ENCOUNTER — Ambulatory Visit: Payer: 59 | Admitting: Physician Assistant

## 2011-01-05 ENCOUNTER — Ambulatory Visit (INDEPENDENT_AMBULATORY_CARE_PROVIDER_SITE_OTHER): Payer: 59 | Admitting: Physician Assistant

## 2011-01-05 ENCOUNTER — Encounter: Payer: Self-pay | Admitting: Physician Assistant

## 2011-01-05 VITALS — BP 124/86 | HR 60 | Ht 67.5 in | Wt 173.0 lb

## 2011-01-05 DIAGNOSIS — I472 Ventricular tachycardia, unspecified: Secondary | ICD-10-CM

## 2011-01-05 DIAGNOSIS — I5022 Chronic systolic (congestive) heart failure: Secondary | ICD-10-CM

## 2011-01-05 DIAGNOSIS — I10 Essential (primary) hypertension: Secondary | ICD-10-CM

## 2011-01-05 DIAGNOSIS — I4891 Unspecified atrial fibrillation: Secondary | ICD-10-CM

## 2011-01-05 DIAGNOSIS — I4729 Other ventricular tachycardia: Secondary | ICD-10-CM

## 2011-01-05 DIAGNOSIS — I428 Other cardiomyopathies: Secondary | ICD-10-CM

## 2011-01-05 DIAGNOSIS — Z95810 Presence of automatic (implantable) cardiac defibrillator: Secondary | ICD-10-CM

## 2011-01-05 MED ORDER — CARVEDILOL 12.5 MG PO TABS: 18.7500 mg | ORAL_TABLET | Freq: Two times a day (BID) | ORAL | Status: DC

## 2011-01-05 NOTE — Progress Notes (Signed)
History of Present Illness: Primary Electrophysiologist:  Dr. Virl Axe  MACARIUS RUSZKOWSKI is a 40 y.o. male who presents for follow up on chronic heart failure.  He has a history of nonischemic cardiomyopathy, status post aborted sudden cardiac death, status post AICD implantation, atrial fibrillation, polymorphic ventricular tachycardia, amiodarone therapy, severe mitral and tricuspid regurgitation, rheumatoid arthritis, GI bleeding in 1/12 secondary to diverticulitis.  He is not on Coumadin.  Last myoview 3/06: No ischemia, positive apical scar.  Last echo 1/11: EF 20-25%, moderate-severe MR, moderate LAE, mild RAE, moderate-severe TR, PASP 43.  He was last seen by Dr. Caryl Comes 8/1.  He was doing well at that time.  Decision was made to titrate his carvedilol.  He was increased from 6.25 to 12.5 mg twice a day.  He is brought back today for follow up to further increase his carvedilol to a maximum dose of 25 mg twice a day.  The patient denies chest pain, shortness of breath, syncope, orthopnea, PND or significant pedal edema.  No palpitations.  Describes NYHA class 1-2 symptoms.  Past Medical History  Diagnosis Date  . Chronic systolic heart failure     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%)  . Atrial fibrillation      Paroxysmal ATRIAL FIBRILLATION WITH CHRONIC ANTICOAGULATION THERAPY.[name] KitchenMarland KitchenAmiodarone  . Noncompliance     H/O  MEDICAL NONCOMPLIANCE  . 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 Tricuspid valve regurgitation     SEVERE  . Severe mitral regurgitation   . Polymorphic ventricular tachycardia     RECURRENT WITH APPROPRIATE SHOCK THERAPY IN THE PAST  . Ventricular fibrillation     WITH APPROPRIATE SHOCK THERAPY IN THE PAST  . Hypertension   . Gout   . RA (rheumatoid arthritis)     Current Outpatient Prescriptions  Medication Sig Dispense Refill  . amiodarone (PACERONE) 200 MG tablet TAKE 1 1/2 TAB  EVERY DAY      . carvedilol (COREG) 12.5 MG tablet Take 1.5 tablets (18.75 mg total) by mouth 2 (two) times daily with a meal.  90 tablet  6  . docusate sodium (COLACE) 100 MG capsule Take 100 mg by mouth 2 (two) times daily.        . folic acid (FOLVITE) 1 MG tablet 2 tablets Daily.      . furosemide (LASIX) 40 MG tablet        . iron polysaccharides (NIFEREX) 150 MG capsule Take 150 mg by mouth 2 (two) times daily.        [name] lisinopril (PRINIVIL,ZESTRIL) 20 MG tablet Take 1 tablet (20 mg total) by mouth daily.  30 tablet  11  . methotrexate (RHEUMATREX) 2.5 MG tablet 5 tablets Once a week.       . potassium chloride SA (K-DUR,KLOR-CON) 20 MEQ tablet Take 1 tablet (20 mEq total) by mouth daily.  30 tablet  3  . spironolactone (ALDACTONE) 25 MG tablet Take 25 mg by mouth daily. Take 1/2 tab qd       . DISCONTD: carvedilol (COREG) 12.5 MG tablet Take 1 tablet (12.5 mg total) by mouth 2 (two) times daily with a meal.  60 tablet  6    Allergies: No Known Allergies  Vital Signs: BP 124/86  Pulse 60  Ht 5' 7.5" (1.715 m)  Wt 173 lb (78.472 kg)  BMI 26.70 kg/m2  PHYSICAL EXAM: Well nourished, well developed, in no acute distress HEENT:  normal Neck: no JVD Cardiac:  normal S1, S2; RRR; no murmur Lungs:  clear to auscultation bilaterally, no wheezing, rhonchi or rales Abd: soft, nontender, no hepatomegaly Ext: no edema Skin: warm and dry Neuro:  CNs 2-12 intact, no focal abnormalities noted  EKG:  Atrial fibrillation, heart rate 72, left axis deviation, poor R wave progression, nonspecific ST-T wave changes  ASSESSMENT AND PLAN:

## 2011-01-05 NOTE — Assessment & Plan Note (Signed)
His blood pressure is improved.  His heart rate is 72 on ECG.  I will try to adjust his carvedilol to 18.75 mg twice a day initially.  He is back in atrial fibrillation.  He had a massive GI bleed back in January.  He is not really a candidate for anticoagulation.  I will bring him back in followup in 2 weeks to further titrate his Coreg and to reassess his rhythm.

## 2011-01-05 NOTE — Patient Instructions (Signed)
Your physician recommends that you schedule a follow-up appointment in: 01/20/11 @ 2:30 to see Richardson Dopp, PA-C  Your physician has recommended you make the following change in your medication: INCREASE COREG 12.5 MG TAKE 1 AND 1/2 TABLEST TWICE DAILY THIS WILL EQUAL 18.75 MG TWICE DAILY  YOU HAVE BEEN GIVEN A PRESCRIPTION TO HAVE SOME BLOOD WORK DONE @ YOUR RHEUMATOLOGIST OFFICE; PLEASE HAVE THEM FAX RESULTS TO [place], PA-C [phone]

## 2011-01-05 NOTE — Assessment & Plan Note (Addendum)
Rate controlled.  As noted, he is not a candidate for anticoagulant therapy.  I will try to reach his gastroenterologist to see if he can take an aspirin.  I reviewed his chart.  He had a massive bleed and required 11 units of packed blood cells.  He was transferred to [facility] for an enteroscopy.  He did not have a colectomy and still has diverticulosis.  We discussed his potential risk for stroke.  However, his risk for bleeding is much greater.  I will also review this with Dr. Caryl Comes.

## 2011-01-05 NOTE — Assessment & Plan Note (Signed)
Continue amiodarone at current dose.  Recent LFTs normal.  I will give him an order to obtain a TSH with his next blood draw with his rheumatologist.

## 2011-01-05 NOTE — Assessment & Plan Note (Signed)
Better controlled 

## 2011-01-06 ENCOUNTER — Other Ambulatory Visit: Payer: Self-pay | Admitting: Physician Assistant

## 2011-01-07 LAB — TSH: TSH: 0.02 u[IU]/mL — ABNORMAL LOW (ref 0.350–4.500)

## 2011-01-20 ENCOUNTER — Other Ambulatory Visit: Payer: Self-pay | Admitting: *Deleted

## 2011-01-20 ENCOUNTER — Ambulatory Visit: Payer: 59 | Admitting: Physician Assistant

## 2011-01-20 DIAGNOSIS — I4891 Unspecified atrial fibrillation: Secondary | ICD-10-CM

## 2011-01-20 MED ORDER — AMIODARONE HCL 200 MG PO TABS: ORAL_TABLET | ORAL | Status: DC

## 2011-01-27 ENCOUNTER — Telehealth: Payer: Self-pay | Admitting: *Deleted

## 2011-01-27 DIAGNOSIS — I4891 Unspecified atrial fibrillation: Secondary | ICD-10-CM

## 2011-01-27 MED ORDER — AMIODARONE HCL 200 MG PO TABS: ORAL_TABLET | ORAL | Status: DC

## 2011-01-27 NOTE — Telephone Encounter (Signed)
Pt called needs refill on amiodarone sent in refill online also called in Called pt to let him know meds are in

## 2011-01-28 LAB — BASIC METABOLIC PANEL
BUN: 11
BUN: 13
CO2: 26
CO2: 27
Calcium: 8.6
Calcium: 8.7
Chloride: 109
Creatinine, Ser: 1.06
Creatinine, Ser: 1.12
GFR calc Af Amer: >60
GFR calc non Af Amer: >60
Glucose, Bld: 95

## 2011-01-28 LAB — CBC
MCHC: 31.1
MCHC: 31.2
MCV: 69.9 — ABNORMAL LOW
Platelets: 256
Platelets: 259
RBC: 5.71
RDW: 19.2 — ABNORMAL HIGH

## 2011-01-28 LAB — HEPARIN LEVEL (UNFRACTIONATED)
Heparin Unfractionated: 0.4
Heparin Unfractionated: 0.72 — ABNORMAL HIGH

## 2011-01-28 LAB — PROTIME-INR
INR: 2 — ABNORMAL HIGH
Prothrombin Time: 22.6 — ABNORMAL HIGH
Prothrombin Time: 23.6 — ABNORMAL HIGH

## 2011-01-29 LAB — BASIC METABOLIC PANEL WITH GFR
BUN: 14
BUN: 14
BUN: 16
BUN: 16
CO2: 26
CO2: 27
CO2: 27
CO2: 28
Calcium: 8.6
Calcium: 8.6
Calcium: 8.7
Calcium: 8.8
Chloride: 104
Chloride: 107
Chloride: 108
Chloride: 109
Creatinine, Ser: 1.15
Creatinine, Ser: 1.17
Creatinine, Ser: 1.19
Creatinine, Ser: 1.31
GFR calc Af Amer: >60
GFR calc Af Amer: >60
GFR calc Af Amer: >60
GFR calc Af Amer: >60
GFR calc non Af Amer: >60
GFR calc non Af Amer: >60
GFR calc non Af Amer: >60
GFR calc non Af Amer: >60
Glucose, Bld: 90
Glucose, Bld: 90
Glucose, Bld: 92
Glucose, Bld: 98
Potassium: 3.4 — ABNORMAL LOW
Potassium: 3.9
Potassium: 4.1
Potassium: 4.2
Sodium: 138
Sodium: 138
Sodium: 140
Sodium: 141

## 2011-01-29 LAB — CBC
HCT: 39.8
Hemoglobin: 12.5 — ABNORMAL LOW
MCHC: 31.4
MCV: 69.2 — ABNORMAL LOW
Platelets: 269
RBC: 5.75
RDW: 19.1 — ABNORMAL HIGH
WBC: 7

## 2011-01-29 LAB — PROTIME-INR
INR: 1.9 — ABNORMAL HIGH
INR: 2 — ABNORMAL HIGH
Prothrombin Time: 22 — ABNORMAL HIGH
Prothrombin Time: 23.5 — ABNORMAL HIGH
Prothrombin Time: 29.2 — ABNORMAL HIGH
Prothrombin Time: 38 — ABNORMAL HIGH

## 2011-01-29 LAB — BASIC METABOLIC PANEL
Calcium: 8.4
GFR calc Af Amer: >60
GFR calc non Af Amer: >60
Sodium: 138

## 2011-01-29 LAB — HEPARIN LEVEL (UNFRACTIONATED)
Heparin Unfractionated: 0.31
Heparin Unfractionated: 0.32

## 2011-01-29 LAB — MAGNESIUM: Magnesium: 2.2

## 2011-01-29 LAB — APTT: aPTT: 49 — ABNORMAL HIGH

## 2011-02-02 ENCOUNTER — Other Ambulatory Visit: Payer: 59 | Admitting: *Deleted

## 2011-02-02 ENCOUNTER — Telehealth: Payer: Self-pay | Admitting: *Deleted

## 2011-02-02 DIAGNOSIS — I4729 Other ventricular tachycardia: Secondary | ICD-10-CM

## 2011-02-02 DIAGNOSIS — I472 Ventricular tachycardia: Secondary | ICD-10-CM

## 2011-02-02 NOTE — Telephone Encounter (Signed)
Labs ordered today

## 2011-02-03 ENCOUNTER — Ambulatory Visit (INDEPENDENT_AMBULATORY_CARE_PROVIDER_SITE_OTHER): Payer: 59 | Admitting: *Deleted

## 2011-02-03 DIAGNOSIS — I4891 Unspecified atrial fibrillation: Secondary | ICD-10-CM

## 2011-02-03 DIAGNOSIS — I5022 Chronic systolic (congestive) heart failure: Secondary | ICD-10-CM

## 2011-02-03 DIAGNOSIS — I10 Essential (primary) hypertension: Secondary | ICD-10-CM

## 2011-02-03 LAB — TSH: TSH: 0.04 u[IU]/mL — ABNORMAL LOW (ref 0.35–5.50)

## 2011-02-10 ENCOUNTER — Ambulatory Visit: Payer: Self-pay | Admitting: Internal Medicine

## 2011-02-10 DIAGNOSIS — I4891 Unspecified atrial fibrillation: Secondary | ICD-10-CM

## 2011-02-27 ENCOUNTER — Other Ambulatory Visit: Payer: Self-pay | Admitting: Cardiology

## 2011-03-06 ENCOUNTER — Telehealth: Payer: Self-pay | Admitting: *Deleted

## 2011-03-06 NOTE — Telephone Encounter (Signed)
Dr. Baldwin Crown office # is [phone].

## 2011-03-06 NOTE — Telephone Encounter (Signed)
I spoke with the patient and made him aware that we need to have him see Dr. Reynold Bowen due to hyperthyroidism on his labs. He is agreeable with doing this. I have left a message for JoAnne at Dr. Baldwin Crown office to call about scheduling this.

## 2011-03-09 ENCOUNTER — Ambulatory Visit (INDEPENDENT_AMBULATORY_CARE_PROVIDER_SITE_OTHER): Payer: 59 | Admitting: *Deleted

## 2011-03-09 ENCOUNTER — Encounter: Payer: Self-pay | Admitting: Internal Medicine

## 2011-03-09 DIAGNOSIS — I469 Cardiac arrest, cause unspecified: Secondary | ICD-10-CM

## 2011-03-09 DIAGNOSIS — Z95810 Presence of automatic (implantable) cardiac defibrillator: Secondary | ICD-10-CM

## 2011-03-09 LAB — ICD DEVICE OBSERVATION
BATTERY VOLTAGE: 2.56 V
HV IMPEDENCE: 40 Ohm
VENTRICULAR PACING ICD: 0 pct

## 2011-03-09 NOTE — Progress Notes (Signed)
ICD check

## 2011-03-09 NOTE — Telephone Encounter (Signed)
I spoke with Joann at Dr. Baldwin Crown office. She has the patient scheduled for an appointment with Dr. Forde Dandy on Monday 04/27/11 @ 3pm. He will need to arrive at 2:30pm. They are going to mail him paperwork that he needs to complete. Per Arville Go, they will also give him a call. I left a message on the patient's cell number regarding the appointment date and time. I have also left the office number for Dr. Forde Dandy for him.

## 2011-03-09 NOTE — Telephone Encounter (Signed)
Joann from Dr. Baldwin Crown office left a message on my voice mail this morning that she called back. I attempted to call her back. She is with a patient. I left a message for her to call.

## 2011-03-11 ENCOUNTER — Encounter: Payer: 59 | Admitting: *Deleted

## 2011-04-30 ENCOUNTER — Encounter: Payer: Self-pay | Admitting: Internal Medicine

## 2011-04-30 ENCOUNTER — Ambulatory Visit (INDEPENDENT_AMBULATORY_CARE_PROVIDER_SITE_OTHER): Payer: 59 | Admitting: *Deleted

## 2011-04-30 DIAGNOSIS — I472 Ventricular tachycardia, unspecified: Secondary | ICD-10-CM

## 2011-04-30 DIAGNOSIS — I4729 Other ventricular tachycardia: Secondary | ICD-10-CM

## 2011-04-30 LAB — ICD DEVICE OBSERVATION
BRDY-0002RV: 40 {beats}/min
DEV-0020ICD: NEGATIVE
DEVICE MODEL ICD: 202836
RV LEAD THRESHOLD: 1.2 V

## 2011-04-30 NOTE — Progress Notes (Signed)
ICD check

## 2011-05-26 ENCOUNTER — Ambulatory Visit (INDEPENDENT_AMBULATORY_CARE_PROVIDER_SITE_OTHER): Payer: 59 | Admitting: Internal Medicine

## 2011-05-26 ENCOUNTER — Encounter: Payer: Self-pay | Admitting: Internal Medicine

## 2011-05-26 DIAGNOSIS — I472 Ventricular tachycardia, unspecified: Secondary | ICD-10-CM

## 2011-05-26 DIAGNOSIS — I4729 Other ventricular tachycardia: Secondary | ICD-10-CM

## 2011-05-26 DIAGNOSIS — I5022 Chronic systolic (congestive) heart failure: Secondary | ICD-10-CM

## 2011-05-26 DIAGNOSIS — I428 Other cardiomyopathies: Secondary | ICD-10-CM

## 2011-05-26 DIAGNOSIS — I4891 Unspecified atrial fibrillation: Secondary | ICD-10-CM

## 2011-05-26 DIAGNOSIS — Z95810 Presence of automatic (implantable) cardiac defibrillator: Secondary | ICD-10-CM

## 2011-05-26 DIAGNOSIS — E0590 Thyrotoxicosis, unspecified without thyrotoxic crisis or storm: Secondary | ICD-10-CM

## 2011-05-26 NOTE — Assessment & Plan Note (Signed)
Device has reached ERI. He'll need to undergo generator replacement. He currently is hyperthyroid at this is or was at least in October). We will reassess his thyroid and then referred to endocrinology prior to submission to anesthesia for generator replacement

## 2011-05-26 NOTE — Progress Notes (Signed)
HPI  [name] is a 41 y.o. male Seen in followup for  atrial fibrillation which had been  is persistent He was in sinus rhythm when seen in April. 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 He has had post implant appropriate therapy. He also has severe tricuspid regurgitation. Functional status has been quite good without significant shortness of breath or chest pain or peripheral edema.    He was hospitalized in January because of GI bleeding related to diverticulitis He recently found also to have rheumatoid arthritis.  He is doing really quite well at this time. His minimum shortness of breath or chest pain and no peripheral edema. He has lost a total of 30 pounds  Past Medical History  Diagnosis Date  . Chronic systolic heart failure     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%)  . Atrial fibrillation      Paroxysmal ATRIAL FIBRILLATION WITH CHRONIC ANTICOAGULATION THERAPY.Marland KitchenMarland KitchenAmiodarone  . Noncompliance     H/O  MEDICAL NONCOMPLIANCE  . 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 Tricuspid valve regurgitation     SEVERE  . Severe mitral regurgitation   . Polymorphic ventricular tachycardia     RECURRENT WITH APPROPRIATE SHOCK THERAPY IN THE PAST  . Ventricular fibrillation     WITH APPROPRIATE SHOCK THERAPY IN THE PAST  . Hypertension   . Gout   . RA (rheumatoid arthritis)     Past Surgical History  Procedure Date  . Cardiac catheterization 06/2006    RIGHT HEART CATH SHOWING SEVERE BIVENTRICUALR CHF WITH MARKED FILLING AND PRESSURES  . Insert / replace / remove pacemaker     GUIDANT HE ICD MODEL 2180, SERIAL # [id number]  . Cholecystectomy     Current Outpatient Prescriptions  Medication Sig Dispense Refill  . amiodarone (PACERONE) 200 MG tablet TAKE 1 1/2 TAB EVERY DAY  45 tablet  12  . carvedilol (COREG) 12.5  MG tablet Take 1.5 tablets (18.75 mg total) by mouth 2 (two) times daily with a meal.  90 tablet  6  . docusate sodium (COLACE) 100 MG capsule Take 100 mg by mouth 2 (two) times daily.        . folic acid (FOLVITE) 1 MG tablet 2 tablets Daily.      . furosemide (LASIX) 40 MG tablet Take 40 mg by mouth daily.       . iron polysaccharides (NIFEREX) 150 MG capsule Take 150 mg by mouth 2 (two) times daily.        Marland Kitchen lisinopril (PRINIVIL,ZESTRIL) 40 MG tablet       . methotrexate (RHEUMATREX) 2.5 MG tablet 5 tablets Once a week.       . potassium chloride SA (K-DUR,KLOR-CON) 20 MEQ tablet Take 1 tablet (20 mEq total) by mouth daily.  30 tablet  3  . spironolactone (ALDACTONE) 25 MG tablet Take 12.5 mg by mouth daily.         No Known Allergies  Review of Systems negative except from HPI and PMH  Physical Exam Well developed and well nourished in no acute distress HENT normal E scleral and icterus clear Neck Supple JVP flat; carotids brisk and full Clear to ausculation Regular rate and rhythm, no murmurs gallops or rub Soft with active bowel sounds No clubbing cyanosis and edema Alert and oriented, grossly normal motor and sensory  function Skin Warm and Dry  ECG normal sinus rhythm at 53 Intervals 0.19/0.11/0.48 Nonspecific repolarization abnormalities  Assessment and  Plan

## 2011-05-26 NOTE — Assessment & Plan Note (Signed)
Clinically stable on guidelines directed medical therapy

## 2011-05-26 NOTE — Assessment & Plan Note (Signed)
No clinical events

## 2011-05-26 NOTE — Assessment & Plan Note (Signed)
No intercurrent VT 

## 2011-05-26 NOTE — Patient Instructions (Signed)
Your physician recommends that you have lab work today: TSH/ free T4/ free T3  You have been referred to : Dr. Forde Dandy- endocrinology (hyperthyroidism)  We will be in touch with you about scheduling your generator change after you see endocrinology.

## 2011-05-26 NOTE — Assessment & Plan Note (Signed)
Stable

## 2011-05-26 NOTE — Assessment & Plan Note (Signed)
We'll reassess thyroid parameters as it's been 3 or 4 months. He'll need to be referred to endocrinology

## 2011-05-27 LAB — TSH: TSH: 2.1 u[IU]/mL (ref 0.35–5.50)

## 2011-05-27 LAB — T3, FREE: T3, Free: 2.3 pg/mL (ref 2.3–4.2)

## 2011-06-03 ENCOUNTER — Telehealth: Payer: Self-pay | Admitting: Internal Medicine

## 2011-06-08 ENCOUNTER — Other Ambulatory Visit: Payer: Self-pay | Admitting: Internal Medicine

## 2011-06-10 ENCOUNTER — Telehealth: Payer: Self-pay | Admitting: *Deleted

## 2011-06-10 ENCOUNTER — Encounter: Payer: 59 | Admitting: *Deleted

## 2011-06-10 NOTE — Telephone Encounter (Signed)
[name] was seen in the office on 05/26/11 by  Dr. Caryl Comes. Patient had labs drawn and a referral to Dr. Forde Dandy- endocrinology for (hyperthyroidism) . Patient states that he received a call from [facility] stating his  labs were fine and he may not need to see Dr. Forde Dandy, but she would need speak with Dr. Caryl Comes before he cancel his appointment with Dr. Forde Dandy on 06/12/11 @ 11am. Patient would like to know if he should cancel the appointment. Patient can reach at [phone].

## 2011-06-10 NOTE — Telephone Encounter (Signed)
I spoke with the patient. I explained we can cancel his appointment for 2/22 with Dr. Forde Dandy. I will ask Bethel Born to call their office tomorrow and do this for the patient since it is after 5 pm. We will schedule him for 3/13 for his ICD gen change. I will call him back on Friday with the details.

## 2011-06-11 NOTE — Telephone Encounter (Signed)
Called Dr. Forde Dandy office to cancel the appointment, I was transfer to Dr. Forde Dandy secretary Shea Stakes) so I can leave a message on her VM. Per Dr. Forde Dandy office, all new patient who appointments are cancel ,must be sent to [facility] for documentation reason.

## 2011-06-12 ENCOUNTER — Encounter: Payer: Self-pay | Admitting: *Deleted

## 2011-06-16 NOTE — Telephone Encounter (Signed)
Left a message to call back.

## 2011-06-16 NOTE — Telephone Encounter (Signed)
New Msg: Pt calling wanting to speak with nurse/MD regarding pushing back pt battery change. Please return pt call to discuss further.

## 2011-06-17 ENCOUNTER — Encounter (HOSPITAL_COMMUNITY): Payer: Self-pay | Admitting: Pharmacy Technician

## 2011-06-17 NOTE — Telephone Encounter (Signed)
Will forward to Alvis Lemmings, Dr. Olin Pia nurse.

## 2011-06-18 NOTE — Telephone Encounter (Signed)
I spoke with the patient. He will keep his date of 3/13 for generator change.

## 2011-06-23 ENCOUNTER — Other Ambulatory Visit: Payer: Self-pay | Admitting: Internal Medicine

## 2011-06-23 DIAGNOSIS — I469 Cardiac arrest, cause unspecified: Secondary | ICD-10-CM

## 2011-06-26 ENCOUNTER — Other Ambulatory Visit: Payer: Self-pay | Admitting: *Deleted

## 2011-06-26 ENCOUNTER — Other Ambulatory Visit (INDEPENDENT_AMBULATORY_CARE_PROVIDER_SITE_OTHER): Payer: 59

## 2011-06-26 DIAGNOSIS — I472 Ventricular tachycardia: Secondary | ICD-10-CM

## 2011-06-26 DIAGNOSIS — I4729 Other ventricular tachycardia: Secondary | ICD-10-CM

## 2011-06-26 DIAGNOSIS — I428 Other cardiomyopathies: Secondary | ICD-10-CM

## 2011-06-26 LAB — CBC WITH DIFFERENTIAL/PLATELET
Basophils Absolute: 0 10*3/uL (ref 0.0–0.1)
Basophils Relative: 0.3 % (ref 0.0–3.0)
Eosinophils Absolute: 0 10*3/uL (ref 0.0–0.7)
Hemoglobin: 13.4 g/dL (ref 13.0–17.0)
Lymphocytes Relative: 31.5 % (ref 12.0–46.0)
MCHC: 31.5 g/dL (ref 30.0–36.0)
MCV: 71.9 fl — ABNORMAL LOW (ref 78.0–100.0)
Monocytes Absolute: 0.5 10*3/uL (ref 0.1–1.0)
Neutro Abs: 2.9 10*3/uL (ref 1.4–7.7)
RBC: 5.91 Mil/uL — ABNORMAL HIGH (ref 4.22–5.81)
RDW: 19.9 % — ABNORMAL HIGH (ref 11.5–14.6)

## 2011-06-26 LAB — BASIC METABOLIC PANEL
CO2: 27 mEq/L (ref 19–32)
Calcium: 8.7 mg/dL (ref 8.4–10.5)
Chloride: 105 mEq/L (ref 96–112)
Creatinine, Ser: 1.6 mg/dL — ABNORMAL HIGH (ref 0.4–1.5)
Glucose, Bld: 88 mg/dL (ref 70–99)
Sodium: 139 mEq/L (ref 135–145)

## 2011-06-30 MED ORDER — CEFAZOLIN SODIUM-DEXTROSE 2-3 GM-% IV SOLR
2.0000 g | INTRAVENOUS | Status: AC
  Filled 2011-06-30: qty 50

## 2011-06-30 MED ORDER — SODIUM CHLORIDE 0.9 % IR SOLN
80.0000 mg | Status: AC
  Filled 2011-06-30: qty 2

## 2011-07-01 ENCOUNTER — Other Ambulatory Visit: Payer: Self-pay

## 2011-07-01 ENCOUNTER — Ambulatory Visit (HOSPITAL_COMMUNITY): Payer: 59

## 2011-07-01 ENCOUNTER — Ambulatory Visit (HOSPITAL_COMMUNITY)
Admission: RE | Admit: 2011-07-01 | Discharge: 2011-07-01 | Disposition: A | Discharge status: Home / Self Care | Payer: 59 | Source: Ambulatory Visit | Attending: Internal Medicine | Admitting: Internal Medicine

## 2011-07-01 ENCOUNTER — Encounter (HOSPITAL_COMMUNITY)
Admission: RE | Disposition: A | Discharge status: Home / Self Care | Payer: Self-pay | Source: Ambulatory Visit | Attending: Internal Medicine

## 2011-07-01 DIAGNOSIS — I469 Cardiac arrest, cause unspecified: Secondary | ICD-10-CM

## 2011-07-01 DIAGNOSIS — M109 Gout, unspecified: Secondary | ICD-10-CM | POA: Insufficient documentation

## 2011-07-01 DIAGNOSIS — I059 Rheumatic mitral valve disease, unspecified: Secondary | ICD-10-CM | POA: Insufficient documentation

## 2011-07-01 DIAGNOSIS — M069 Rheumatoid arthritis, unspecified: Secondary | ICD-10-CM | POA: Insufficient documentation

## 2011-07-01 DIAGNOSIS — Z8674 Personal history of sudden cardiac arrest: Secondary | ICD-10-CM | POA: Insufficient documentation

## 2011-07-01 DIAGNOSIS — I4891 Unspecified atrial fibrillation: Secondary | ICD-10-CM | POA: Insufficient documentation

## 2011-07-01 DIAGNOSIS — I5022 Chronic systolic (congestive) heart failure: Secondary | ICD-10-CM | POA: Insufficient documentation

## 2011-07-01 DIAGNOSIS — I4729 Other ventricular tachycardia: Secondary | ICD-10-CM

## 2011-07-01 DIAGNOSIS — I472 Ventricular tachycardia: Secondary | ICD-10-CM

## 2011-07-01 DIAGNOSIS — Z4502 Encounter for adjustment and management of automatic implantable cardiac defibrillator: Secondary | ICD-10-CM | POA: Insufficient documentation

## 2011-07-01 DIAGNOSIS — I079 Rheumatic tricuspid valve disease, unspecified: Secondary | ICD-10-CM | POA: Insufficient documentation

## 2011-07-01 DIAGNOSIS — I10 Essential (primary) hypertension: Secondary | ICD-10-CM | POA: Insufficient documentation

## 2011-07-01 DIAGNOSIS — I428 Other cardiomyopathies: Secondary | ICD-10-CM | POA: Insufficient documentation

## 2011-07-01 HISTORY — PX: IMPLANTABLE CARDIOVERTER DEFIBRILLATOR GENERATOR CHANGE: SHX5474

## 2011-07-01 LAB — SURGICAL PCR SCREEN: MRSA, PCR: POSITIVE — AB

## 2011-07-01 SURGERY — IMPLANTABLE CARDIOVERTER DEFIBRILLATOR GENERATOR CHANGE
Anesthesia: LOCAL

## 2011-07-01 MED ORDER — MIDAZOLAM HCL 5 MG/5ML IJ SOLN
INTRAMUSCULAR | Status: AC
  Filled 2011-07-01: qty 5

## 2011-07-01 MED ORDER — ACETAMINOPHEN 325 MG PO TABS
325.0000 mg | ORAL_TABLET | ORAL | Status: DC | PRN
  Administered 2011-07-01: 650 mg via ORAL

## 2011-07-01 MED ORDER — FENTANYL CITRATE 0.05 MG/ML IJ SOLN
INTRAMUSCULAR | Status: AC
  Filled 2011-07-01: qty 2

## 2011-07-01 MED ORDER — LIDOCAINE HCL (PF) 1 % IJ SOLN
INTRAMUSCULAR | Status: AC
  Filled 2011-07-01: qty 60

## 2011-07-01 MED ORDER — ACETAMINOPHEN 325 MG PO TABS
ORAL_TABLET | ORAL | Status: AC
  Filled 2011-07-01: qty 2

## 2011-07-01 MED ORDER — SODIUM CHLORIDE 0.9 % IV SOLN: INTRAVENOUS | Status: AC

## 2011-07-01 MED ORDER — SODIUM CHLORIDE 0.9 % IV SOLN
INTRAVENOUS | Status: DC
  Administered 2011-07-01: 1000 mL via INTRAVENOUS

## 2011-07-01 MED ORDER — MUPIROCIN 2 % EX OINT
TOPICAL_OINTMENT | Freq: Two times a day (BID) | CUTANEOUS | Status: DC
  Administered 2011-07-01: 07:00:00 via NASAL
  Filled 2011-07-01 (×2): qty 22

## 2011-07-01 MED ORDER — CEFAZOLIN SODIUM 1-5 GM-% IV SOLN
INTRAVENOUS | Status: AC
  Filled 2011-07-01: qty 100

## 2011-07-01 MED ORDER — CHLORHEXIDINE GLUCONATE 4 % EX LIQD
60.0000 mL | Freq: Once | CUTANEOUS | Status: DC
  Filled 2011-07-01: qty 60

## 2011-07-01 MED ORDER — ONDANSETRON HCL 4 MG/2ML IJ SOLN: 4.0000 mg | Freq: Four times a day (QID) | INTRAMUSCULAR | Status: DC | PRN

## 2011-07-01 NOTE — Brief Op Note (Signed)
07/01/2011  9:58 AM  PATIENT:  [name]  41 y.o. male  PRE-OPERATIVE DIAGNOSIS:  End of life; icd  POST-OPERATIVE DIAGNOSIS:  Same   PROCEDURE:  Procedure(s) (LRB): IMPLANTABLE CARDIOVERTER DEFIBRILLATOR GENERATOR CHANGE (N/A) Pocket revision  SURGEON:  Surgeon(s) and Role:    * Deboraha Sprang, MD - Primary     DICTATION: .Other Dictation: Dictation Number [id number]

## 2011-07-01 NOTE — Progress Notes (Signed)
Pt c/o soreness at incision site at left chest, tylenol given per order

## 2011-07-01 NOTE — Progress Notes (Signed)
D/C instructions given, pt verbalized understanding 

## 2011-07-01 NOTE — Discharge Instructions (Signed)
Pacemaker Battery Change A pacemaker battery usually lasts 4 to 12 years. Once or twice per year, you will be asked to visit your caregiver to have a full evaluation of your pacemaker. When a battery needs to be replaced, the entire pacemaker is actually replaced so that you can benefit from new circuitry and any new features that have recently been added to pacemakers. Most often, this procedure is very simple because the leads are already in place. After giving medicine to numb the skin, your health care provider makes a cut to reopen the pocket holding the pacemaker and disconnects the old device from its leads. The leads are routinely tested at this time. If they are working okay, the new pacemaker may simply be connected to the existing leads. If there is any problem with the old lead system, it may be wise to replace the lead system while inserting the new pacemaker. There are many things that affect how long a pacemaker battery will last:  Age of the pacemaker.   Number of leads (1, 2 or 3).   Pacemaker work load. If the pacemaker is helping the heart more often, then the battery will not last as long as if the pacemaker does not need to help the heart.   Resistance of the leads. The greater the resistance, the greater the drain on the battery. This can increase as the leads get older or if one or more of the leads does not have the best contact with the heart.   Power (voltage) settings.   The health of the person's heart. If the health of the heart gets worse, then the pacemaker may have to work more often and the setting changed to accommodate these changes.  Your health care provider will be alerted to the fact that it is time to replace the battery during follow-up exams. He or she will check your pacemaker using a small table-top computer, called a programmer, and a wand. The wand is about the same size as a remote control. Your provider puts the wand on your body in the area where the  pacemaker is located. Information from the pacemaker is received about how well your heart is working and the status of the battery. It is not painful, and it usually takes just a few minutes. You will have plenty of time before the battery is fully used up to plan for replacement.  LET YOUR CAREGIVER KNOW ABOUT:   Symptoms of chest pain, trouble breathing, palpitations, lightheadedness, or feelings of an abnormal or irregular heart beat.   Allergies.   Medications taken including herbs, eye drops, over the counter medications, and creams   Use of steroids (by mouth or creams).   Possible pregnancy, if applicable.   Previous problems with anesthetics or Novocaine.   History of blood clots (thrombophlebitis).   History of bleeding or blood problems.   Surgery since your last pacemaker placement.   Other health problems.  RISKS AND COMPLICATIONS These are very uncommon but include:  Bleeding.   Bruising of the skin around where the incision was made.   Pain at the site of the incision.   Pulling apart of the skin at the incision site.   Infection.   Allergic reaction to anesthetics or medicines used during the procedure.  Diabetics may have a temporary increase in their blood sugar after any surgical procedure.  BEFORE THE PROCEDURE  Wash all of the skin around the area of the chest where the pacemaker is located.   Try to remove any loose, scaling skin. Unless advised otherwise, avoid using aspirin, ibuprofen, or naproxen for 3-4 days before the procedure. Ask your caregiver for help with any other medication adjustments before the pacemaker is replaced. Unless advised otherwise, do not eat or drink after midnight on the night before the procedure EXCEPT for drinking water and taking your medications as you normally would. AFTER THE PROCEDURE   A heart monitor and the pacemaker programmer will be used to make sure that the new pacemaker is working properly.   You can go home  after the procedure.   Your caregiver will advise you if you need to have any stitches. They will be removed 5-7 days after the procedure.  HOME CARE INSTRUCTIONS   Keep the incision clean and dry.   Unless advised otherwise, you may shower after carefully covering the incision with plastic wrap that is taped to your chest.   For the first week after the replacement, avoid stretching motions that pull at the incision site and avoid heavy exercise with the arm on the same side as the incision.   Only take over-the-counter or prescription medicines for pain, discomfort, or fever as directed by your caregiver.   Your caregiver will tell you when you will need to next test your pacemaker by telephone or when to return to the office for re-exam and/or removal of stitches, if necessary.  SEEK MEDICAL CARE IF:   You have unusual pain at the incision site that is not adequately helped by over-the-counter or prescription medicine.   There is drainage or pus from the incision site.   You develop red streaking that extends above or below the incision site.   You feel brief intermittent palpitations, lightheadedness or any symptoms that you feel might be related to your heart.  SEEK IMMEDIATE MEDICAL CARE IF:   You experience chest pain that is different than the pain at the incision site.   You experience:   Shortness of breath.   Palpitations.   Irregular heart beat.   Lightheadedness that does not go away quickly.   Fainting.   You develop a fever.   You have pain that gets worse even though you are taking pain medicine.  MAKE SURE YOU:   Understand these instructions.   Will watch your condition.   Will get help right away if you are not doing well or get worse.  Document Released: 07/15/2006 Document Revised: 03/26/2011 Document Reviewed: 10/18/2006 [employer] Patient Information 2012 [employer]. 

## 2011-07-01 NOTE — H&P (Signed)
History and Physical  Patient ID: [name] MRN: [id number], SOB: [birth year] 41 y.o. Date of Encounter: 07/01/2011, 8:25 AM  Primary Physician: No primary provider on file.  Chief Complaint: ICD change out    History of Present Illness: [name] is a 41 y.o. 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 He has had post implant appropriate therapy. He also has severe tricuspid regurgitation. Functional status has been quite good without significant shortness of breath or chest pain or peripheral edema   He is now here for ICD generator replacement    The patient denies chest pain, shortness of breath, nocturnal dyspnea, orthopnea or peripheral edema.  There have been no palpitations, lightheadedness or syncope.     Past Medical History  Diagnosis Date  . Chronic systolic heart failure     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%)  . Atrial fibrillation      Paroxysmal ATRIAL FIBRILLATION WITH CHRONIC ANTICOAGULATION THERAPY.Marland KitchenMarland KitchenAmiodarone  . Noncompliance     H/O  MEDICAL NONCOMPLIANCE  . 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 Tricuspid valve regurgitation     SEVERE  . Severe mitral regurgitation   . Polymorphic ventricular tachycardia     RECURRENT WITH APPROPRIATE SHOCK THERAPY IN THE PAST  . Ventricular fibrillation     WITH APPROPRIATE SHOCK THERAPY IN THE PAST  . Hypertension   . Gout   . RA (rheumatoid arthritis)      Past Surgical History  Procedure Date  . Cardiac catheterization 06/2006    RIGHT HEART CATH SHOWING SEVERE BIVENTRICUALR CHF WITH MARKED FILLING AND PRESSURES  . Insert / replace / remove pacemaker     GUIDANT HE ICD MODEL 2180, SERIAL # [id number]  . Cholecystectomy       Current Facility-Administered Medications  Medication Dose Route Frequency Provider Last Rate Last Dose  . 0.9 %   sodium chloride infusion   Intravenous Continuous Deboraha Sprang, MD 50 mL/hr at 07/01/11 0722 1,000 mL at 07/01/11 0722  . ceFAZolin (ANCEF) IVPB 2 g/50 mL premix  2 g Intravenous On Call Deboraha Sprang, MD      . chlorhexidine (HIBICLENS) 4 % liquid 4 application  60 mL Topical Once Deboraha Sprang, MD      . gentamicin (GARAMYCIN) 80 mg in sodium chloride irrigation 0.9 % 500 mL irrigation  80 mg Irrigation On Call Deboraha Sprang, MD      . lidocaine (XYLOCAINE) 1 % injection           . mupirocin ointment (BACTROBAN) 2 %   Nasal BID Deboraha Sprang, MD         Allergies: No Known Allergies   History  Substance Use Topics  . Smoking status: Never Smoker   . Smokeless tobacco: Never Used  . Alcohol Use: No      Family History  Problem Relation Age of Onset  . Heart failure Brother       ROS:  Please see the history of present illness.   Vital Signs: Blood pressure 140/88, pulse 69, temperature 97.4 F (36.3 C), temperature source Oral, resp. rate 20, height 5' 7.75" (1.721 m), weight 180 lb (81.647 kg), SpO2 100.00%.  PHYSICAL EXAM: General:  Well nourished, well developed male in no acute distress  HEENT: normal Lymph: no adenopathy Neck: no JVD  Vascular: No carotid bruits;  Cardiac:  normal S1, S2; RRR; no murmur Chest: pocket with some retraction but no tethering Back: without kyphosis/scoliosis, no CVA tenderness Lungs:  clear to auscultation bilaterally, no wheezing, rhonchi or rales Abd: soft, nontender, no hepatomegaly Ext: no edema Musculoskeletal:  No deformities, BUE and BLE strength normal and equal Skin: warm and dry Neuro:  CNs 2-12 intact, no focal abnormalities noted Psych:  Normal affect   EKG:    Labs:   Lab Results  Component Value Date   WBC 4.9 06/26/2011   HGB 13.4 06/26/2011   HCT 42.5 06/26/2011   MCV 71.9* 06/26/2011   PLT 170.0 06/26/2011    Lab 06/26/11 1237  NA 139  K 3.9  CL 105  CO2 27  BUN 17  CREATININE 1.6*  CALCIUM 8.7  PROT  --  BILITOT --  ALKPHOS --  ALT --  AST --  GLUCOSE 88   No results found for this basename: CKTOTAL:4,CKMB:4,TROPONINI:4 in the last 72 hours Lab Results  Component Value Date   CHOL  Value: 136        ATP III CLASSIFICATION:  <200     mg/dL   Desirable  200-239  mg/dL   Borderline High  >=240    mg/dL   High        02/05/2009   HDL 30* 02/05/2009   LDLCALC  Value: 69        Total Cholesterol/HDL:CHD Risk Coronary Heart Disease Risk Table                     Men   Women  1/2 Average Risk   3.4   3.3  Average Risk       5.0   4.4  2 X Average Risk   9.6   7.1  3 X Average Risk  23.4   11.0        Use the calculated Patient Ratio above and the CHD Risk Table to determine the patient's CHD Risk.        ATP III CLASSIFICATION (LDL):  <100     mg/dL   Optimal  100-129  mg/dL   Near or Above                    Optimal  130-159  mg/dL   Borderline  160-189  mg/dL   High  >190     mg/dL   Very High 02/05/2009   TRIG 187* 02/05/2009   Lab Results  Component Value Date   DDIMER  Value: <0.22        AT THE INHOUSE ESTABLISHED CUTOFF VALUE OF 0.48 ug/mL FEU, THIS ASSAY HAS BEEN DOCUMENTED IN THE LITERATURE TO HAVE A SENSITIVITY AND NEGATIVE PREDICTIVE VALUE OF AT LEAST 98 TO 99%.  THE TEST RESULT SHOULD BE CORRELATED WITH AN ASSESSMENT OF THE CLINICAL PROBABILITY OF DVT / VTE. 02/04/2009   BNP Pro B Natriuretic peptide (BNP)  Date/Time Value Range Status  05/19/2010  4:00 AM 85.0  0.0-100.0 (pg/mL) Final  05/18/2010  2:00 AM 74.2  0.0-100.0 (pg/mL) Final  02/05/2009  2:00 AM 385.0* 0.0-100.0 (pg/mL) Final  02/04/2009  5:40 AM 728.0* 0.0-100.0 (pg/mL) Final       ASSESSMENT AND PLAN:   For ICD gchange  There is retraction but not tethering will use antimicrobial patch and culture the pocket  I am concerned about low grade infection  We have reviewed the benefits and risks of generator  replacement.  These include but are not limited to lead fracture and infection.  The patient understands, agrees  and is willing to proceed.

## 2011-07-02 ENCOUNTER — Encounter: Payer: Self-pay | Admitting: Internal Medicine

## 2011-07-02 NOTE — Op Note (Signed)
NAME:  [name], [name] NO.:  [id number]  MEDICAL RECORD NO.:  [id number]  LOCATION:  [facility]                         FACILITY:  [facility]  PHYSICIAN:  Deboraha Sprang, MD, FACCDATE OF BIRTH:  20-Sep-1970  DATE OF PROCEDURE:  07/01/2011 DATE OF DISCHARGE:                              OPERATIVE REPORT   PREOPERATIVE DIAGNOSIS:  Previously implanted implantable cardioverter- defibrillator.  POSTOPERATIVE DIAGNOSIS:  Previously implanted implantable cardioverter- defibrillator.  PROCEDURE:  Explantation of a previously implanted device, implantation of a new device with intraoperative defibrillation threshold testing, and pocket revision.  Following obtaining informed consent, the patient was brought to the electrophysiology laboratory and placed on the fluoroscopic table in the supine position.  After routine prep and drape of the left upper chest, lidocaine was infiltrated about a cm below the previous incision. Because of thinning and some possible small degree of retraction, an incision was made and carried down to the layer of device pocket using sharp dissection and electrocautery.  The pocket was opened with moderate difficulty because of the scar tissue.  The leads were freed up over the first inch or so because that was as much as I could do, and the device was explanted.  Because of the greater transverse length of the new device, pocket was extended laterally, as well as caudally and cephalad to allow for insertion of the new device.  Interrogation of the previously implanted ICD lead demonstrated an amplitude of 15.2 with a pace impedance of 749 and threshold of 0.8 at 0.5.  We tested the impedance through the subcu __________ and that was 727, through the distal coil was 751, and the leads were then attached to a [employer] ICD, model E141, serial number [id number], the amplitude was 13.9 with a pace impedance of 667, threshold 0.8 at 0.5,  high- voltage impedance was 40 ohms.  A 0.1-joule shock was delivered synchronously in sinus rhythm with a measured impedance of 29 ohms.  Ventricular fibrillation was then induced.  We tried the T-wave shock and ultimately ended up using the 50 Hz induction scheme.  After a total duration of 7 seconds, a 29-joule shock was delivered through a measured resistance of 43 ohms terminating ventricular fibrillation, restoring sinus rhythm.  The device was implanted.  The pocket was copiously irrigated with antibiotic containing saline solution.  Hemostasis was assured.  An Aegis antimicrobial pocket was inserted, and Surgicel was placed at the cephalad aspect of the pocket.  The mesh and the device were secured at the cephalad aspect of the pocket, and the wound was then closed in 3 layers in a normal fashion.  The wound was washed, dried, and a benzoin Steri-Strip dressing was applied. Needle counts, sponge counts, and instrument counts were correct at the end of the procedure.  The patient tolerated the procedure without apparent complication.     Deboraha Sprang, MD, [facility]     SCK/MEDQ  D:  07/01/2011  T:  07/01/2011  Job:  [id number]

## 2011-07-02 NOTE — Telephone Encounter (Signed)
error 

## 2011-07-03 LAB — WOUND CULTURE: Gram Stain: NONE SEEN

## 2011-07-10 ENCOUNTER — Encounter: Payer: Self-pay | Admitting: *Deleted

## 2011-07-15 ENCOUNTER — Encounter: Payer: Self-pay | Admitting: Internal Medicine

## 2011-07-15 ENCOUNTER — Ambulatory Visit (INDEPENDENT_AMBULATORY_CARE_PROVIDER_SITE_OTHER): Payer: 59 | Admitting: *Deleted

## 2011-07-15 ENCOUNTER — Ambulatory Visit: Payer: 59

## 2011-07-15 DIAGNOSIS — I472 Ventricular tachycardia, unspecified: Secondary | ICD-10-CM

## 2011-07-15 DIAGNOSIS — I5022 Chronic systolic (congestive) heart failure: Secondary | ICD-10-CM

## 2011-07-15 DIAGNOSIS — I4729 Other ventricular tachycardia: Secondary | ICD-10-CM

## 2011-07-15 LAB — ICD DEVICE OBSERVATION
DEV-0020ICD: NEGATIVE
VENTRICULAR PACING ICD: 22 pct

## 2011-07-15 NOTE — Progress Notes (Signed)
ICD wound check

## 2011-08-24 ENCOUNTER — Telehealth: Payer: Self-pay

## 2011-08-24 NOTE — Telephone Encounter (Signed)
Received phone call from patient stating he went to pick up medication at pharmacy and iron medicine was not refilled.Patient states he does not have a PCP.Patient was told will need to check with Dr.Klein and he is out of office today.Will forward to [place] for advice.

## 2011-08-31 NOTE — Telephone Encounter (Signed)
i dont find record of iron studies, although he does have anemia  i would suggest taht prior to refilling the iron that we check his iron and TIBC levels to determine what is the right next thing to do

## 2011-09-01 ENCOUNTER — Other Ambulatory Visit: Payer: 59

## 2011-09-01 ENCOUNTER — Other Ambulatory Visit: Payer: Self-pay

## 2011-09-01 DIAGNOSIS — D649 Anemia, unspecified: Secondary | ICD-10-CM

## 2011-09-01 NOTE — Telephone Encounter (Signed)
Patient called was told Dr.Klein advised needs to have iron and TIBC.Patient stated he will have done today 09/01/11.

## 2011-09-02 ENCOUNTER — Telehealth: Payer: Self-pay | Admitting: *Deleted

## 2011-09-02 DIAGNOSIS — D509 Iron deficiency anemia, unspecified: Secondary | ICD-10-CM

## 2011-09-02 LAB — IRON AND TIBC
%SAT: 5 % — ABNORMAL LOW (ref 20–55)
Iron: 15 ug/dL — ABNORMAL LOW (ref 42–165)
TIBC: 321 ug/dL (ref 215–435)
UIBC: 306 ug/dL (ref 125–400)

## 2011-09-02 MED ORDER — POLYSACCHARIDE IRON COMPLEX 150 MG PO CAPS: 150.0000 mg | ORAL_CAPSULE | Freq: Two times a day (BID) | ORAL | Status: DC

## 2011-09-02 NOTE — Telephone Encounter (Signed)
H >>>>[name] remains iron deficient. We can renew his iron, but he needs an appt with either a PCP--he can go to urgent care if he wants- but this needs to be evaluated and we should not fill this again, but ask him to get this done by someone who has evaluated and is following the problem with him Thank you s  I have spoken with the patient and made his aware of his lab results and Dr. Olin Pia recommendations. He is aware we will refill his iron x 1 time and future refills will need to come from a PCP. He verbalizes understanding. I will send his iron RX in to [facility] on [address] per the patient's request. Alvis Lemmings, RN, BSN

## 2011-10-02 ENCOUNTER — Encounter: Payer: Self-pay | Admitting: *Deleted

## 2011-10-13 ENCOUNTER — Other Ambulatory Visit: Payer: Self-pay | Admitting: Internal Medicine

## 2011-10-13 ENCOUNTER — Other Ambulatory Visit: Payer: Self-pay | Admitting: Physician Assistant

## 2011-10-13 ENCOUNTER — Other Ambulatory Visit: Payer: Self-pay | Admitting: Cardiology

## 2011-11-12 ENCOUNTER — Encounter: Payer: Self-pay | Admitting: Internal Medicine

## 2012-02-04 ENCOUNTER — Other Ambulatory Visit: Payer: Self-pay | Admitting: *Deleted

## 2012-02-04 MED ORDER — AMIODARONE HCL 200 MG PO TABS: 300.0000 mg | ORAL_TABLET | Freq: Every day | ORAL | Status: DC

## 2012-02-05 ENCOUNTER — Other Ambulatory Visit: Payer: Self-pay | Admitting: Cardiology

## 2012-02-05 MED ORDER — AMIODARONE HCL 200 MG PO TABS: 300.0000 mg | ORAL_TABLET | Freq: Every day | ORAL | Status: DC

## 2012-03-29 ENCOUNTER — Other Ambulatory Visit: Payer: Self-pay | Admitting: *Deleted

## 2012-03-29 MED ORDER — LISINOPRIL 40 MG PO TABS: 40.0000 mg | ORAL_TABLET | Freq: Every day | ORAL | Status: DC

## 2012-05-02 ENCOUNTER — Other Ambulatory Visit: Payer: Self-pay | Admitting: *Deleted

## 2012-05-02 MED ORDER — LISINOPRIL 40 MG PO TABS: 40.0000 mg | ORAL_TABLET | Freq: Every day | ORAL | Status: DC

## 2012-05-03 ENCOUNTER — Encounter: Payer: Self-pay | Admitting: *Deleted

## 2012-05-18 ENCOUNTER — Ambulatory Visit (INDEPENDENT_AMBULATORY_CARE_PROVIDER_SITE_OTHER): Payer: 59 | Admitting: *Deleted

## 2012-05-18 DIAGNOSIS — I4891 Unspecified atrial fibrillation: Secondary | ICD-10-CM

## 2012-05-18 DIAGNOSIS — I4729 Other ventricular tachycardia: Secondary | ICD-10-CM

## 2012-05-18 DIAGNOSIS — I472 Ventricular tachycardia, unspecified: Secondary | ICD-10-CM

## 2012-05-18 DIAGNOSIS — I428 Other cardiomyopathies: Secondary | ICD-10-CM

## 2012-05-18 DIAGNOSIS — I5022 Chronic systolic (congestive) heart failure: Secondary | ICD-10-CM

## 2012-05-18 LAB — ICD DEVICE OBSERVATION
HV IMPEDENCE: 56 Ohm
VENTRICULAR PACING ICD: 1 pct

## 2012-05-18 NOTE — Progress Notes (Signed)
ICD check

## 2012-05-27 ENCOUNTER — Encounter: Payer: Self-pay | Admitting: Internal Medicine

## 2012-06-01 ENCOUNTER — Other Ambulatory Visit: Payer: Self-pay | Admitting: Cardiology

## 2012-06-01 MED ORDER — SPIRONOLACTONE 25 MG PO TABS: 12.5000 mg | ORAL_TABLET | Freq: Every day | ORAL | Status: DC

## 2012-06-15 ENCOUNTER — Other Ambulatory Visit: Payer: Self-pay

## 2012-06-15 MED ORDER — LISINOPRIL 40 MG PO TABS: 40.0000 mg | ORAL_TABLET | Freq: Every day | ORAL | Status: DC

## 2012-06-15 NOTE — Telephone Encounter (Signed)
..   Requested Prescriptions   Signed Prescriptions Disp Refills  . lisinopril (PRINIVIL,ZESTRIL) 40 MG tablet 30 tablet 2    Sig: Take 1 tablet (40 mg total) by mouth daily.    Authorizing Provider: Deboraha Sprang    Ordering User: Ardis Hughs, Kjell Brannen Jerilynn Mages

## 2012-07-27 ENCOUNTER — Other Ambulatory Visit: Payer: Self-pay | Admitting: Cardiology

## 2012-08-12 ENCOUNTER — Ambulatory Visit (INDEPENDENT_AMBULATORY_CARE_PROVIDER_SITE_OTHER): Payer: 59 | Admitting: Internal Medicine

## 2012-08-12 ENCOUNTER — Encounter: Payer: Self-pay | Admitting: Internal Medicine

## 2012-08-12 VITALS — BP 140/86 | HR 57 | Ht 67.0 in | Wt 184.4 lb

## 2012-08-12 DIAGNOSIS — I5022 Chronic systolic (congestive) heart failure: Secondary | ICD-10-CM

## 2012-08-12 DIAGNOSIS — I472 Ventricular tachycardia, unspecified: Secondary | ICD-10-CM

## 2012-08-12 DIAGNOSIS — I428 Other cardiomyopathies: Secondary | ICD-10-CM

## 2012-08-12 DIAGNOSIS — I4729 Other ventricular tachycardia: Secondary | ICD-10-CM

## 2012-08-12 DIAGNOSIS — E0590 Thyrotoxicosis, unspecified without thyrotoxic crisis or storm: Secondary | ICD-10-CM

## 2012-08-12 DIAGNOSIS — I4891 Unspecified atrial fibrillation: Secondary | ICD-10-CM

## 2012-08-12 LAB — ICD DEVICE OBSERVATION
DEVICE MODEL ICD: 100391
RV LEAD AMPLITUDE: 13.8 mv
RV LEAD THRESHOLD: 1 V

## 2012-08-12 LAB — HEPATIC FUNCTION PANEL
AST: 83 U/L — ABNORMAL HIGH (ref 0–37)
Albumin: 3.7 g/dL (ref 3.5–5.2)
Total Bilirubin: 0.5 mg/dL (ref 0.3–1.2)
Total Protein: 7.5 g/dL (ref 6.0–8.3)

## 2012-08-12 LAB — TSH: TSH: 1.538 u[IU]/mL (ref 0.350–4.500)

## 2012-08-12 MED ORDER — HYDRALAZINE HCL 25 MG PO TABS: 25.0000 mg | ORAL_TABLET | Freq: Three times a day (TID) | ORAL | Status: DC

## 2012-08-12 MED ORDER — ISOSORBIDE MONONITRATE ER 30 MG PO TB24: 30.0000 mg | ORAL_TABLET | Freq: Every day | ORAL | Status: DC

## 2012-08-12 MED ORDER — AMIODARONE HCL 200 MG PO TABS: 200.0000 mg | ORAL_TABLET | Freq: Every day | ORAL | Status: DC

## 2012-08-12 NOTE — Assessment & Plan Note (Signed)
No intercurrent Ventricular tachycardia  

## 2012-08-12 NOTE — Assessment & Plan Note (Signed)
Stable on current medications. We will however add hydralazine nitrates given race related benefits we'll begin 25 twice a day and 30 daily

## 2012-08-12 NOTE — Patient Instructions (Signed)
Your physician recommends that you schedule a follow-up appointment in: 4 weeks with Richardson Dopp, PA-C and 3 months with [place] physician wants you to follow-up in: 6 months with Dr Caryl Comes. You will receive a reminder letter in the mail two months in advance. If you don't receive a letter, please call our office to schedule the follow-up appointment. Your physician has recommended you make the following change in your medication: DECREASE Amiodarone to 200 mg daily and START Hydralazine 25 mg twice daily and Isosorbide 30 mg daily

## 2012-08-12 NOTE — Assessment & Plan Note (Signed)
We'll check surveillance laboratories today on amiodarone

## 2012-08-12 NOTE — Progress Notes (Signed)
Patient has no care team.   HPI  [name] is a 42 y.o. 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 He is status post ICD implantation with device generator replacement about a year ago.  He also has a history of atrial fibrillation  He is doing quite well. He denies chest pain shortness of breath or fatigue. He has had no intercurrent atrial fibrillation  Past Medical History  Diagnosis Date  . Chronic systolic heart failure     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%)  . Atrial fibrillation      Paroxysmal ATRIAL FIBRILLATION WITH CHRONIC ANTICOAGULATION THERAPY.Marland KitchenMarland KitchenAmiodarone  . Noncompliance     H/O  MEDICAL NONCOMPLIANCE  . 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 Tricuspid valve regurgitation     SEVERE  . Severe mitral regurgitation   . Polymorphic ventricular tachycardia     RECURRENT WITH APPROPRIATE SHOCK THERAPY IN THE PAST  . Ventricular fibrillation     WITH APPROPRIATE SHOCK THERAPY IN THE PAST  . Hypertension   . Gout   . RA (rheumatoid arthritis)     Past Surgical History  Procedure Laterality Date  . Cardiac catheterization  06/2006    RIGHT HEART CATH SHOWING SEVERE BIVENTRICUALR CHF WITH MARKED FILLING AND PRESSURES  . Insert / replace / remove pacemaker      GUIDANT HE ICD MODEL 2180, SERIAL # [id number]  . Cholecystectomy      Current Outpatient Prescriptions  Medication Sig Dispense Refill  . amiodarone (PACERONE) 200 MG tablet Take 1.5 tablets (300 mg total) by mouth daily.  135 tablet  3  . carvedilol (COREG) 12.5 MG tablet take 1 and 1/2 tablets by mouth twice a day with meals  90 tablet  6  . folic acid (FOLVITE) 1 MG tablet Take 2 tablets by mouth Daily.       . furosemide (LASIX) 40 MG tablet take 1 and 1/2 tablet by mouth twice a day  90 tablet  3  . KLOR-CON M20 20 MEQ tablet take 1 tablet by mouth once daily  30 tablet   3  . lisinopril (PRINIVIL,ZESTRIL) 40 MG tablet Take 1 tablet (40 mg total) by mouth daily.  30 tablet  2  . spironolactone (ALDACTONE) 25 MG tablet Take 0.5 tablets (12.5 mg total) by mouth daily.  30 tablet  3   No current facility-administered medications for this visit.    No Known Allergies  Review of Systems negative except from HPI and PMH  Physical Exam BP 140/86  Pulse 57  Ht 5\' 7"  (1.702 m)  Wt 184 lb 6.4 oz (83.643 kg)  BMI 28.87 kg/m2 Well developed and well nourished in no acute distress HENT normal E scleral and icterus clear Neck Supple JVP flat; carotids brisk and full Clear to ausculation  Regular rate and rhythm, no murmurs gallops or rub Soft with active bowel sounds No clubbing cyanosis none Edema Alert and oriented, grossly normal motor and sensory function Skin Warm and Dry  ECG demonstrates sinus rhythm at 57 intervals 20/10/47 axis is left in 28 Poor R-wave progression Nonspecific T wave inversions laterally  Assessment and  Plan

## 2012-08-12 NOTE — Assessment & Plan Note (Signed)
Euvolemic. Continue current medications 

## 2012-08-18 ENCOUNTER — Telehealth: Payer: Self-pay | Admitting: Internal Medicine

## 2012-08-18 NOTE — Telephone Encounter (Signed)
Per Dr. Caryl Comes, pt is advised to stop taking Isosorbide and Hydralazine, pt verbalized understanding.

## 2012-08-23 ENCOUNTER — Other Ambulatory Visit: Payer: Self-pay | Admitting: Emergency Medicine

## 2012-08-23 MED ORDER — FOLIC ACID 1 MG PO TABS: 2.0000 mg | ORAL_TABLET | Freq: Every day | ORAL | Status: DC

## 2012-08-23 NOTE — Telephone Encounter (Signed)
Per Nira Conn okay to refill medication since this medication was given to patient prior hospital visit two years ago for divercultis.  Patient is aware that he would need [facility] for further refills.  Patient agreed.

## 2012-09-06 ENCOUNTER — Encounter: Payer: 59 | Admitting: Internal Medicine

## 2012-09-09 ENCOUNTER — Ambulatory Visit: Payer: 59 | Admitting: Physician Assistant

## 2012-09-13 ENCOUNTER — Ambulatory Visit: Payer: 59 | Admitting: Physician Assistant

## 2012-09-20 ENCOUNTER — Other Ambulatory Visit: Payer: Self-pay | Admitting: Cardiology

## 2012-09-20 MED ORDER — LISINOPRIL 40 MG PO TABS: 40.0000 mg | ORAL_TABLET | Freq: Every day | ORAL | Status: DC

## 2012-09-30 ENCOUNTER — Ambulatory Visit (INDEPENDENT_AMBULATORY_CARE_PROVIDER_SITE_OTHER): Payer: 59 | Admitting: Physician Assistant

## 2012-09-30 ENCOUNTER — Encounter: Payer: Self-pay | Admitting: Physician Assistant

## 2012-09-30 VITALS — BP 120/88 | HR 71 | Ht 67.5 in | Wt 182.8 lb

## 2012-09-30 DIAGNOSIS — I5022 Chronic systolic (congestive) heart failure: Secondary | ICD-10-CM

## 2012-09-30 DIAGNOSIS — Z8719 Personal history of other diseases of the digestive system: Secondary | ICD-10-CM

## 2012-09-30 DIAGNOSIS — I10 Essential (primary) hypertension: Secondary | ICD-10-CM

## 2012-09-30 DIAGNOSIS — R7989 Other specified abnormal findings of blood chemistry: Secondary | ICD-10-CM

## 2012-09-30 DIAGNOSIS — I4891 Unspecified atrial fibrillation: Secondary | ICD-10-CM

## 2012-09-30 LAB — BASIC METABOLIC PANEL
Chloride: 105 mEq/L (ref 96–112)
Creatinine, Ser: 1.4 mg/dL (ref 0.4–1.5)
GFR: 70.72 mL/min (ref >60.00)
Potassium: 4.3 mEq/L (ref 3.5–5.1)

## 2012-09-30 LAB — HEPATIC FUNCTION PANEL
ALT: 164 U/L — ABNORMAL HIGH (ref 0–53)
Bilirubin, Direct: 0 mg/dL (ref 0.0–0.3)
Total Bilirubin: 0.7 mg/dL (ref 0.3–1.2)

## 2012-09-30 MED ORDER — POTASSIUM CHLORIDE CRYS ER 20 MEQ PO TBCR: 20.0000 meq | EXTENDED_RELEASE_TABLET | ORAL | Status: DC

## 2012-09-30 MED ORDER — SPIRONOLACTONE 25 MG PO TABS: 25.0000 mg | ORAL_TABLET | Freq: Every day | ORAL | Status: DC

## 2012-09-30 NOTE — Patient Instructions (Addendum)
INCREASE SPIRONOLACTONE 25 MG DAILY  HOLD POTASSIUM; IF YOUR TABLET IS 40 MEQ THEN CUT IN 1/2 AND YOU CAN TAKE 20 MEQ EVERY OTHER DAY  LAB TODAY; BMET, LFT  REPEAT BMET IN 5 DAYS 10/05/12  YOU NEED TO FOLLOW UP WITH DR. MANN ABOUT YOUR HISTORY OF GI BLEEDING; YOU WILL NEED TO FOLLOW UP WITH DR. Caryl Comes AFTER YOU HAVE SEEN YOUR GI DOCTOR  IN ABOUT 4-6 WEEKS

## 2012-09-30 NOTE — Progress Notes (Signed)
[name]. [address], [place]  [address] Phone: [phone] Fax:  [phone]  Date:  09/30/2012   ID:  [name], DOB 02/06/1971, MRN [id number]  PCP:  No primary provider on file.  Cardiologist:  Dr. Virl Axe     History of Present Illness: [name] is a 42 y.o. male who returns for f/u.  He has a hx of NICM, s/p aborted SCD, s/p ICD implantation, AFib, polymorphic VT, severe MR and TR, RA, LGI bleeding due to diverticulitis.  He has been on amiodarone Rx.  Myoview 3/06: no ischemia, + apical scar.  Last echo 1/11: EF 20-25%, moderate-severe MR, moderate LAE, mild RAE, moderate-severe TR, PASP 43.  Last seen by Dr. Caryl Comes 4/14. Hydralazine and nitrates were added to his medical regimen. Patient could not tolerate these due to significant headache. Both medications were discontinued. Since last seen, he denies chest pain, shortness of breath, syncope, orthopnea, PND or edema. He is NYHA class II.  Labs (3/13):  K 3.9, creatinine 1.6, Hgb 13.4 Labs (4/14):  TSH 1.538, ALP 53, AST 83, ALT 123, total bilirubin 0.5  Wt Readings from Last 3 Encounters:  09/30/12 182 lb 12.8 oz (82.918 kg)  08/12/12 184 lb 6.4 oz (83.643 kg)  07/01/11 180 lb (81.647 kg)     Past Medical History  Diagnosis Date  . Chronic systolic heart failure     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%)  . Atrial fibrillation -parosysmal      Rx w amiodarone  . Noncompliance     H/O  MEDICAL NONCOMPLIANCE  . 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 Tricuspid valve regurgitation     SEVERE  . Severe mitral regurgitation   . Polymorphic ventricular tachycardia     RECURRENT WITH APPROPRIATE SHOCK THERAPY IN THE PAST  . Ventricular fibrillation     WITH APPROPRIATE SHOCK THERAPY IN THE PAST  . Hypertension   . Gout   . RA (rheumatoid arthritis)   . Automatic implantable cardiac defibrillator -BSX     single chamber  . GI  bleed -massive     11 Units 2012  . Elevated LFTs     Current Outpatient Prescriptions  Medication Sig Dispense Refill  . amiodarone (PACERONE) 200 MG tablet Take 1 tablet (200 mg total) by mouth daily.  90 tablet  3  . carvedilol (COREG) 12.5 MG tablet take 1 and 1/2 tablets by mouth twice a day with meals  90 tablet  6  . folic acid (FOLVITE) 1 MG tablet Take 2 tablets (2 mg total) by mouth daily.  60 tablet  0  . furosemide (LASIX) 40 MG tablet take 1 and 1/2 tablet by mouth twice a day  90 tablet  3  . hydrALAZINE (APRESOLINE) 25 MG tablet Take 1 tablet (25 mg total) by mouth 3 (three) times daily.  270 tablet  3  . isosorbide mononitrate (IMDUR) 30 MG 24 hr tablet Take 1 tablet (30 mg total) by mouth daily.  90 tablet  3  . KLOR-CON M20 20 MEQ tablet take 1 tablet by mouth once daily  30 tablet  3  . lisinopril (PRINIVIL,ZESTRIL) 40 MG tablet Take 1 tablet (40 mg total) by mouth daily.  30 tablet  5  . spironolactone (ALDACTONE) 25 MG tablet Take 0.5 tablets (12.5 mg total) by mouth daily.  30 tablet  3   No current facility-administered medications for  this visit.    Allergies:   No Known Allergies  Social History:  The patient  reports that he has never smoked. He has never used smokeless tobacco. He reports that he does not drink alcohol or use illicit drugs.   ROS:  Please see the history of present illness.   He denies melena, hematochezia, hematuria, hematemesis.   All other systems reviewed and negative.   PHYSICAL EXAM: VS:  BP 120/88  Pulse 71  Ht 5' 7.5" (1.715 m)  Wt 182 lb 12.8 oz (82.918 kg)  BMI 28.19 kg/m2 Well nourished, well developed, in no acute distress HEENT: normal Neck: no JVD Endocrine: No thyromegaly Cardiac:  normal S1, S2; RRR; no murmur Lungs:  clear to auscultation bilaterally, no wheezing, rhonchi or rales Abd: soft, nontender, no hepatomegaly Ext: no edema Skin: warm and dry Neuro:  CNs 2-12 intact, no focal abnormalities noted  EKG:   Atrial fibrillation, HR 71     ASSESSMENT AND PLAN:  1. Atrial Fibrillation: He is not on anticoagulation secondary to a history of significant, life-threatening GI bleed. I reviewed his records. He presented to the hospital at that time with a hemoglobin of 4.8. He was on Coumadin at that time as well. He now returns back in atrial fibrillation. He remains on amiodarone. Rates are controlled.  He is asymptomatic.  He has not followed up with gastroenterology in quite some time. I discussed his case today with Dr. Caryl Comes. We would like him to return to gastroenterology for follow up evaluation to determine whether or not he is a suitable candidate for anticoagulation. In the event that he is, we can certainly consider restarting this with an eye towards restoring NSR with cardioversion. The patient will be set up for follow up with Dr. Caryl Comes after he has seen gastroenterology. 2. Elevated LFTs: In the setting of amiodarone therapy. LFTs will be repeated today. 3. Chronic Systolic CHF: Volume stable. Continue current dose of beta blocker and ACE inhibitor. Hold potassium.  Increase spironolactone to 25 mg daily. He could not tolerate hydralazine and nitrates due to significant headaches. Check a basic metabolic panel today and repeat in 5 days. 4. Status Post AICD: Follow up with EP as planned. 5. Hyperthyroidism: Recent TSH normal. 6. Hypertension: Fair control. Adjust spironolactone as noted. 7. Disposition: Plan follow up with Dr. Caryl Comes in 4-6 weeks.  Signed, Richardson Dopp, PA-C  09/30/2012 12:25 PM

## 2012-10-04 ENCOUNTER — Other Ambulatory Visit: Payer: Self-pay | Admitting: *Deleted

## 2012-10-04 DIAGNOSIS — R7989 Other specified abnormal findings of blood chemistry: Secondary | ICD-10-CM

## 2012-10-05 ENCOUNTER — Other Ambulatory Visit (INDEPENDENT_AMBULATORY_CARE_PROVIDER_SITE_OTHER): Payer: 59

## 2012-10-05 DIAGNOSIS — R7989 Other specified abnormal findings of blood chemistry: Secondary | ICD-10-CM

## 2012-10-05 DIAGNOSIS — I10 Essential (primary) hypertension: Secondary | ICD-10-CM

## 2012-10-05 DIAGNOSIS — I5022 Chronic systolic (congestive) heart failure: Secondary | ICD-10-CM

## 2012-10-05 LAB — BASIC METABOLIC PANEL
Calcium: 8.4 mg/dL (ref 8.4–10.5)
GFR: 67.37 mL/min (ref >60.00)
Potassium: 3.8 mEq/L (ref 3.5–5.1)
Sodium: 136 mEq/L (ref 135–145)

## 2012-10-06 LAB — HEPATITIS B SURFACE ANTIBODY,QUALITATIVE: Hep B S Ab: NONREACTIVE

## 2012-10-06 LAB — HEPATITIS PANEL, ACUTE
HCV Ab: NEGATIVE
Hep A IgM: NEGATIVE
Hep B C IgM: NEGATIVE
Hepatitis B Surface Ag: NEGATIVE

## 2012-10-07 ENCOUNTER — Telehealth: Payer: Self-pay | Admitting: *Deleted

## 2012-10-07 DIAGNOSIS — I5022 Chronic systolic (congestive) heart failure: Secondary | ICD-10-CM

## 2012-10-07 NOTE — Telephone Encounter (Signed)
lmom labs ok, hep panel negative, bmet 7/9/ when he sees Dr. Caryl Comes

## 2012-10-07 NOTE — Telephone Encounter (Signed)
Message copied by Michae Kava on Fri Oct 07, 2012 12:06 PM ------      Message from: [place], [place] T      Created: Thu Oct 06, 2012  4:49 PM       Hepatitis labs normal/negative.      Richardson Dopp, PA-C        10/06/2012 4:49 PM ------

## 2012-10-10 ENCOUNTER — Ambulatory Visit (HOSPITAL_COMMUNITY)
Admission: RE | Admit: 2012-10-10 | Discharge: 2012-10-10 | Disposition: A | Discharge status: Home / Self Care | Payer: 59 | Source: Ambulatory Visit | Attending: Physician Assistant | Admitting: Physician Assistant

## 2012-10-10 DIAGNOSIS — Z9089 Acquired absence of other organs: Secondary | ICD-10-CM | POA: Insufficient documentation

## 2012-10-10 DIAGNOSIS — R7989 Other specified abnormal findings of blood chemistry: Secondary | ICD-10-CM | POA: Insufficient documentation

## 2012-10-25 ENCOUNTER — Encounter: Payer: Self-pay | Admitting: *Deleted

## 2012-10-25 ENCOUNTER — Encounter: Payer: Self-pay | Admitting: Internal Medicine

## 2012-10-26 ENCOUNTER — Encounter: Payer: Self-pay | Admitting: Internal Medicine

## 2012-10-26 ENCOUNTER — Other Ambulatory Visit (INDEPENDENT_AMBULATORY_CARE_PROVIDER_SITE_OTHER): Payer: 59

## 2012-10-26 ENCOUNTER — Encounter: Payer: Self-pay | Admitting: *Deleted

## 2012-10-26 ENCOUNTER — Ambulatory Visit (INDEPENDENT_AMBULATORY_CARE_PROVIDER_SITE_OTHER): Payer: 59 | Admitting: Internal Medicine

## 2012-10-26 VITALS — BP 126/82 | HR 64 | Ht 67.0 in | Wt 183.0 lb

## 2012-10-26 DIAGNOSIS — R945 Abnormal results of liver function studies: Secondary | ICD-10-CM

## 2012-10-26 DIAGNOSIS — I428 Other cardiomyopathies: Secondary | ICD-10-CM

## 2012-10-26 DIAGNOSIS — I472 Ventricular tachycardia, unspecified: Secondary | ICD-10-CM

## 2012-10-26 DIAGNOSIS — I5022 Chronic systolic (congestive) heart failure: Secondary | ICD-10-CM

## 2012-10-26 DIAGNOSIS — I4729 Other ventricular tachycardia: Secondary | ICD-10-CM

## 2012-10-26 DIAGNOSIS — I4891 Unspecified atrial fibrillation: Secondary | ICD-10-CM

## 2012-10-26 DIAGNOSIS — R7989 Other specified abnormal findings of blood chemistry: Secondary | ICD-10-CM

## 2012-10-26 DIAGNOSIS — Z95810 Presence of automatic (implantable) cardiac defibrillator: Secondary | ICD-10-CM

## 2012-10-26 LAB — ICD DEVICE OBSERVATION
HV IMPEDENCE: 54 Ohm
RV LEAD AMPLITUDE: 15 mv
RV LEAD IMPEDENCE ICD: 746 Ohm
RV LEAD THRESHOLD: 0.8 V

## 2012-10-26 LAB — HEPATIC FUNCTION PANEL
ALT: 103 U/L — ABNORMAL HIGH (ref 0–53)
AST: 77 U/L — ABNORMAL HIGH (ref 0–37)
Bilirubin, Direct: 0 mg/dL (ref 0.0–0.3)
Total Protein: 8.2 g/dL (ref 6.0–8.3)

## 2012-10-26 LAB — BASIC METABOLIC PANEL
BUN: 20 mg/dL (ref 6–23)
Calcium: 9.1 mg/dL (ref 8.4–10.5)
GFR: 68.43 mL/min (ref >60.00)
Potassium: 3.8 mEq/L (ref 3.5–5.1)
Sodium: 140 mEq/L (ref 135–145)

## 2012-10-26 MED ORDER — APIXABAN 5 MG PO TABS: 5.0000 mg | ORAL_TABLET | Freq: Two times a day (BID) | ORAL | Status: DC

## 2012-10-26 NOTE — Patient Instructions (Addendum)
You will need lab work today:  BMP    We will call you with your results  Medication changes:    Start: Apixabin (Eliquis)  5 mg twice a day  Your physician has requested that you have an echocardiogram. Echocardiography is a painless test that uses sound waves to create images of your heart. It provides your doctor with information about the size and shape of your heart and how well your heart's chambers and valves are working. This procedure takes approximately one hour. There are no restrictions for this procedure.  Your physician has recommended that you have a Cardioversion (DCCV) in 4 weeks.  Electrical Cardioversion uses a jolt of electricity to your heart either through paddles or wired patches attached to your chest. This is a controlled, usually prescheduled, procedure. Defibrillation is done under light anesthesia in the hospital, and you usually go home the day of the procedure. This is done to get your heart back into a normal rhythm. You are not awake for the procedure. Please see the instruction sheet given to you today.      Your physician recommends that you schedule a follow-up appointment in: 3 months with Device clinic.

## 2012-10-26 NOTE — Assessment & Plan Note (Signed)
No intercurrent Ventricular tachycardia  

## 2012-10-26 NOTE — Assessment & Plan Note (Signed)
He was intolerant of nitrates. We'll add back hydralazine as and afterload reducer; there is some thought that this is particularly beneficial in African Americans

## 2012-10-26 NOTE — Assessment & Plan Note (Signed)
The patient has recurrent atrial flutter/fibrillation. His thromboembolic risk profile includes hypertension and cardiomyopathy. We will resume a NOAC. Anticoagulation was previously discontinued because of diverticular bleeding. Will anticipate cardioversion in about 4 weeks. He will remain off amiodarone because of liver function test abnormalities. His QTc interval currently precludes dofetilide. I would have a low threshold in this young man for consideration of catheter ablation. To that end we will undertake an echo to look at left atrial dimension

## 2012-10-26 NOTE — Assessment & Plan Note (Signed)
The patient's device was interrogated.  The information was reviewed. No changes were made in the programming.    

## 2012-10-26 NOTE — Assessment & Plan Note (Signed)
Improving in the context of amiodarone withdrawal.

## 2012-10-26 NOTE — Progress Notes (Signed)
Patient Care Team: Liliane Shi, PA-C as PCP - General (Physician Assistant)   HPI  [name] is a 41 y.o. 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 He is status post ICD implantation with device generator replacement about a year ago.  He also has a history of atrial fibrillation for which he previously took amiodarone;  He had elevated LFTs and amiodarone was discontinued; recent values checked yesterday are improved. He is doing quite well.    Last echo 1/11: EF 20-25%, moderate-severe MR, moderate LAE, mild RAE, moderate-severe TR, PASP 43. Last seen by Dr. Caryl Comes 4/14. Hydralazine and nitrates were added to his medical regimen. Patient could not tolerate these due to significant headache. Both medications were discontinued   Past Medical History  Diagnosis Date  . Chronic systolic heart failure     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%)  . Atrial fibrillation -parosysmal      Rx w amiodarone  . Noncompliance     H/O  MEDICAL NONCOMPLIANCE  . 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 Tricuspid valve regurgitation     SEVERE  . Severe mitral regurgitation   . Polymorphic ventricular tachycardia     RECURRENT WITH APPROPRIATE SHOCK THERAPY IN THE PAST  . Ventricular fibrillation     WITH APPROPRIATE SHOCK THERAPY IN THE PAST  . Hypertension   . Gout   . RA (rheumatoid arthritis)   . Automatic implantable cardiac defibrillator -BSX     single chamber  . GI bleed -massive     11 Units 2012  . Elevated LFTs   . H/O hyperthyroidism     Past Surgical History  Procedure Laterality Date  . Cardiac catheterization  06/2006    RIGHT HEART CATH SHOWING SEVERE BIVENTRICUALR CHF WITH MARKED FILLING AND PRESSURES  . Insert / replace / remove pacemaker      GUIDANT HE ICD MODEL 2180, SERIAL # [id number]  . Cholecystectomy      Current Outpatient Prescriptions  Medication Sig  Dispense Refill  . carvedilol (COREG) 12.5 MG tablet take 1 and 1/2 tablets by mouth twice a day with meals  90 tablet  6  . folic acid (FOLVITE) 1 MG tablet Take 2 tablets (2 mg total) by mouth daily.  60 tablet  0  . furosemide (LASIX) 40 MG tablet take 1 and 1/2 tablet by mouth twice a day  90 tablet  3  . lisinopril (PRINIVIL,ZESTRIL) 40 MG tablet Take 1 tablet (40 mg total) by mouth daily.  30 tablet  5  . potassium chloride SA (KLOR-CON M20) 20 MEQ tablet Take 1 tablet (20 mEq total) by mouth as directed. HOLD      . spironolactone (ALDACTONE) 25 MG tablet Take 1 tablet (25 mg total) by mouth daily.  30 tablet  11   No current facility-administered medications for this visit.    No Known Allergies  Review of Systems negative except from HPI and PMH  Physical Exam BP 126/82  Pulse 64  Ht 5\' 7"  (1.702 m)  Wt 183 lb (83.008 kg)  BMI 28.66 kg/m2 Well developed and well nourished in no acute distress HENT normal E scleral and icterus clear Neck Supple JVP flat; carotids brisk and full Clear to ausculation Irregular rate and rhythm, no murmurs gallops or rub Soft with active bowel sounds No clubbing cyanosis none Edema Alert and oriented, grossly normal motor and sensory function  Skin Warm and Dry  ECG demonstrates atrial fibrillation/flutter  Assessment and  Plan partr

## 2012-10-27 ENCOUNTER — Other Ambulatory Visit: Payer: Self-pay | Admitting: *Deleted

## 2012-10-27 ENCOUNTER — Telehealth: Payer: Self-pay | Admitting: *Deleted

## 2012-10-27 DIAGNOSIS — I5022 Chronic systolic (congestive) heart failure: Secondary | ICD-10-CM

## 2012-10-27 MED ORDER — FUROSEMIDE 40 MG PO TABS: ORAL_TABLET | ORAL | Status: DC

## 2012-10-27 NOTE — Telephone Encounter (Signed)
Message copied by Michae Kava on Thu Oct 27, 2012 12:35 PM ------      Message from: [place], [place] T      Created: Wed Oct 26, 2012  4:55 PM       K+ and creatinine ok      LFTs somewhat improved      Repeat LFTs again in 4 weeks      Richardson Dopp, PA-C        10/26/2012 4:55 PM ------

## 2012-10-27 NOTE — Telephone Encounter (Signed)
pt notified about lab results today with verbal understanding , LFT 12/16/12

## 2012-10-31 ENCOUNTER — Other Ambulatory Visit: Payer: Self-pay | Admitting: *Deleted

## 2012-10-31 ENCOUNTER — Telehealth: Payer: Self-pay | Admitting: *Deleted

## 2012-10-31 MED ORDER — CARVEDILOL 12.5 MG PO TABS: ORAL_TABLET | ORAL | Status: DC

## 2012-10-31 NOTE — Telephone Encounter (Signed)
Pt tried to refill his Spironolactone, was told the refill was invalid, yet he has 11 refills remaining. Sp[facility] to [place] at [facility] and they are refilling the meds. Called pt back and informed him that they will fill the med.

## 2012-11-01 ENCOUNTER — Other Ambulatory Visit: Payer: 59

## 2012-11-03 ENCOUNTER — Ambulatory Visit (HOSPITAL_COMMUNITY): Payer: 59 | Attending: Internal Medicine | Admitting: Radiology

## 2012-11-03 DIAGNOSIS — I4891 Unspecified atrial fibrillation: Secondary | ICD-10-CM | POA: Insufficient documentation

## 2012-11-03 NOTE — Progress Notes (Signed)
Echocardiogram performed.  

## 2012-11-17 ENCOUNTER — Other Ambulatory Visit: Payer: 59

## 2012-11-18 ENCOUNTER — Ambulatory Visit (INDEPENDENT_AMBULATORY_CARE_PROVIDER_SITE_OTHER): Payer: 59 | Admitting: *Deleted

## 2012-11-18 DIAGNOSIS — I4891 Unspecified atrial fibrillation: Secondary | ICD-10-CM

## 2012-11-18 LAB — CBC WITH DIFFERENTIAL/PLATELET
Basophils Absolute: 0 10*3/uL (ref 0.0–0.1)
Lymphocytes Relative: 28.5 % (ref 12.0–46.0)
Monocytes Relative: 10.7 % (ref 3.0–12.0)
Neutrophils Relative %: 60.1 % (ref 43.0–77.0)
Platelets: 230 10*3/uL (ref 150.0–400.0)
RDW: 19.1 % — ABNORMAL HIGH (ref 11.5–14.6)

## 2012-11-18 LAB — BASIC METABOLIC PANEL
BUN: 19 mg/dL (ref 6–23)
CO2: 28 mEq/L (ref 19–32)
GFR: 68.97 mL/min (ref >60.00)
Glucose, Bld: 86 mg/dL (ref 70–99)
Potassium: 4.1 mEq/L (ref 3.5–5.1)
Sodium: 139 mEq/L (ref 135–145)

## 2012-11-22 ENCOUNTER — Encounter (HOSPITAL_COMMUNITY): Payer: Self-pay | Admitting: Anesthesiology

## 2012-11-22 ENCOUNTER — Ambulatory Visit (HOSPITAL_COMMUNITY)
Admission: RE | Admit: 2012-11-22 | Discharge: 2012-11-22 | Disposition: A | Discharge status: Home / Self Care | Payer: 59 | Source: Ambulatory Visit | Attending: Cardiology | Admitting: Cardiology

## 2012-11-22 ENCOUNTER — Encounter (HOSPITAL_COMMUNITY)
Admission: RE | Disposition: A | Discharge status: Home / Self Care | Payer: Self-pay | Source: Ambulatory Visit | Attending: Cardiology

## 2012-11-22 DIAGNOSIS — M069 Rheumatoid arthritis, unspecified: Secondary | ICD-10-CM | POA: Insufficient documentation

## 2012-11-22 DIAGNOSIS — Z95810 Presence of automatic (implantable) cardiac defibrillator: Secondary | ICD-10-CM | POA: Insufficient documentation

## 2012-11-22 DIAGNOSIS — Z7901 Long term (current) use of anticoagulants: Secondary | ICD-10-CM | POA: Insufficient documentation

## 2012-11-22 DIAGNOSIS — I10 Essential (primary) hypertension: Secondary | ICD-10-CM | POA: Insufficient documentation

## 2012-11-22 DIAGNOSIS — I509 Heart failure, unspecified: Secondary | ICD-10-CM | POA: Insufficient documentation

## 2012-11-22 DIAGNOSIS — I5022 Chronic systolic (congestive) heart failure: Secondary | ICD-10-CM | POA: Insufficient documentation

## 2012-11-22 DIAGNOSIS — M109 Gout, unspecified: Secondary | ICD-10-CM | POA: Insufficient documentation

## 2012-11-22 DIAGNOSIS — I059 Rheumatic mitral valve disease, unspecified: Secondary | ICD-10-CM | POA: Insufficient documentation

## 2012-11-22 DIAGNOSIS — Z79899 Other long term (current) drug therapy: Secondary | ICD-10-CM | POA: Insufficient documentation

## 2012-11-22 DIAGNOSIS — Z538 Procedure and treatment not carried out for other reasons: Secondary | ICD-10-CM | POA: Insufficient documentation

## 2012-11-22 DIAGNOSIS — I428 Other cardiomyopathies: Secondary | ICD-10-CM | POA: Insufficient documentation

## 2012-11-22 DIAGNOSIS — I4891 Unspecified atrial fibrillation: Secondary | ICD-10-CM | POA: Insufficient documentation

## 2012-11-22 DIAGNOSIS — Z8674 Personal history of sudden cardiac arrest: Secondary | ICD-10-CM | POA: Insufficient documentation

## 2012-11-22 DIAGNOSIS — I079 Rheumatic tricuspid valve disease, unspecified: Secondary | ICD-10-CM | POA: Insufficient documentation

## 2012-11-22 SURGERY — Surgical Case

## 2012-11-22 NOTE — Preoperative (Addendum)
Beta Blockers   Coreg last taken on 11-22-12 @ 10:30

## 2012-11-22 NOTE — Anesthesia Preprocedure Evaluation (Deleted)
Anesthesia Evaluation  Patient identified by MRN, date of birth, ID band Patient awake    Reviewed: Allergy & Precautions, H&P , NPO status , Patient's Chart, lab work & pertinent test results  Airway       Dental   Pulmonary neg pulmonary ROS,          Cardiovascular hypertension, Pt. on medications and Pt. on home beta blockers + dysrhythmias Atrial Fibrillation + Cardiac Defibrillator  Sudden Death Sep 28, 1997 AICD placed,  ------------------------------------------------------------ Study Conclusions  - Left ventricle: LVEF is approximately 25 to 30% with   diffuse hypokinesis, apical akinesis. Basal anterior and   lateral wall contract the best. The cavity size was   moderately dilated. Wall thickness was increased in a   pattern of mild LVH. - Mitral valve: Mild regurgitation. - Left atrium: The atrium was severely dilated. - Right ventricle: Systolic function was moderately reduced. - Pulmonary arteries: PA peak pressure: 97mm Hg (S). MR  10-24-12 ECHO    Neuro/Psych negative neurological ROS  negative psych ROS   GI/Hepatic negative GI ROS, Neg liver ROS,   Endo/Other  negative endocrine ROSHyperthyroidism   Renal/GU negative Renal ROS     Musculoskeletal  (+) Arthritis -, Osteoarthritis,    Abdominal   Peds  Hematology negative hematology ROS (+)   Anesthesia Other Findings   Reproductive/Obstetrics negative OB ROS                        Anesthesia Physical Anesthesia Plan  ASA: III  Anesthesia Plan: General   Post-op Pain Management:    Induction: Intravenous  Airway Management Planned: Mask  Additional Equipment:   Intra-op Plan:   Post-operative Plan:   Informed Consent:   Dental advisory given  Plan Discussed with: CRNA and Anesthesiologist  Anesthesia Plan Comments:         Anesthesia Quick Evaluation

## 2012-11-22 NOTE — Progress Notes (Addendum)
Pt presented for DCCV.  He was seen in clinic by Dr. Caryl Comes in July and was placed on Eliquis 2/2 recurrent Afib with a plan for cardioversion today.  Upon arrival, ECG was performed and shows sinus bradycardia 59, LAE, TWI V6.  DCCV cancelled and pt discharged from endoscopy with plan to f/u with Dr. Caryl Comes as previously scheduled.  He remains on bb therapy and I also advised that he continue Eliquis as Rx.  He verbalized understanding.

## 2012-12-01 ENCOUNTER — Other Ambulatory Visit: Payer: Self-pay | Admitting: *Deleted

## 2012-12-01 DIAGNOSIS — D649 Anemia, unspecified: Secondary | ICD-10-CM

## 2012-12-07 ENCOUNTER — Other Ambulatory Visit: Payer: 59

## 2012-12-16 ENCOUNTER — Ambulatory Visit (INDEPENDENT_AMBULATORY_CARE_PROVIDER_SITE_OTHER): Payer: 59 | Admitting: Internal Medicine

## 2012-12-16 ENCOUNTER — Encounter: Payer: Self-pay | Admitting: Internal Medicine

## 2012-12-16 ENCOUNTER — Other Ambulatory Visit (INDEPENDENT_AMBULATORY_CARE_PROVIDER_SITE_OTHER): Payer: 59

## 2012-12-16 VITALS — BP 128/83 | HR 63 | Ht 67.5 in | Wt 188.4 lb

## 2012-12-16 DIAGNOSIS — I4891 Unspecified atrial fibrillation: Secondary | ICD-10-CM

## 2012-12-16 DIAGNOSIS — I5022 Chronic systolic (congestive) heart failure: Secondary | ICD-10-CM

## 2012-12-16 DIAGNOSIS — I428 Other cardiomyopathies: Secondary | ICD-10-CM

## 2012-12-16 LAB — ICD DEVICE OBSERVATION
DEV-0020ICD: NEGATIVE
HV IMPEDENCE: 53 Ohm
VENTRICULAR PACING ICD: 0 pct

## 2012-12-16 LAB — HEPATIC FUNCTION PANEL
AST: 39 U/L — ABNORMAL HIGH (ref 0–37)
Alkaline Phosphatase: 54 U/L (ref 39–117)
Total Bilirubin: 0.3 mg/dL (ref 0.3–1.2)

## 2012-12-16 MED ORDER — CARVEDILOL 25 MG PO TABS: 25.0000 mg | ORAL_TABLET | Freq: Two times a day (BID) | ORAL | Status: DC

## 2012-12-16 NOTE — Assessment & Plan Note (Signed)
On guideline directed medical therapy. We will increase his carvedilol to 18.75--25 twice daily. He did not tolerate hydralazine nitrates. I was exceedingly pleased to see that his AV valvular regurgitation of a moderate-severe>>> mild.

## 2012-12-16 NOTE — Progress Notes (Signed)
Patient Care Team: Liliane Shi, PA-C as PCP - General (Physician Assistant)   HPI  [name] is a 42 y.o. 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 He is status post ICD implantation with device generator replacement 2013 He also has a history of atrial fibrillation for which he previously took amiodarone;  He had elevated LFTs and amiodarone was discontinued qwith improvement    Last echo 7/14 >> EF 25-30,  Mild MR, severe LAE, mild RAE, mild TR, Hydralazine and nitrates were added to his medical regimen. Patient could not tolerate these due to significant headache. Both medications were discontinued  7/14 reverted to atrial fib, NOAC resumed.  At anticipated DCCV pt had reverted to sinus  The patient denies chest pain, shortness of breath, nocturnal dyspnea, orthopnea or peripheral edema.  There have been no palpitations, lightheadedness or syncope.     Past Medical History  Diagnosis Date  . Chronic systolic heart failure     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%)  . Atrial fibrillation -parosysmal      Rx w amiodarone  . Noncompliance     H/O  MEDICAL NONCOMPLIANCE  . 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 Tricuspid valve regurgitation     SEVERE  . Severe mitral regurgitation   . Polymorphic ventricular tachycardia     RECURRENT WITH APPROPRIATE SHOCK THERAPY IN THE PAST  . Ventricular fibrillation     WITH APPROPRIATE SHOCK THERAPY IN THE PAST  . Hypertension   . Gout   . RA (rheumatoid arthritis)   . Automatic implantable cardiac defibrillator -BSX     single chamber  . GI bleed -massive     11 Units 2012  . Elevated LFTs   . H/O hyperthyroidism     Past Surgical History  Procedure Laterality Date  . Cardiac catheterization  06/2006    RIGHT HEART CATH SHOWING SEVERE BIVENTRICUALR CHF WITH MARKED FILLING AND PRESSURES  . Insert / replace / remove  pacemaker      GUIDANT HE ICD MODEL 2180, SERIAL # [id number]  . Cholecystectomy      Current Outpatient Prescriptions  Medication Sig Dispense Refill  . apixaban (ELIQUIS) 5 MG TABS tablet Take 1 tablet (5 mg total) by mouth 2 (two) times daily.  60 tablet  6  . carvedilol (COREG) 12.5 MG tablet take 1 and 1/2 tablets by mouth twice a day with meals  90 tablet  6  . folic acid (FOLVITE) 1 MG tablet Take 2 tablets (2 mg total) by mouth daily.  60 tablet  0  . furosemide (LASIX) 40 MG tablet Take 1 and 1/2 tablets by mouth twice daily  90 tablet  6  . lisinopril (PRINIVIL,ZESTRIL) 40 MG tablet Take 1 tablet (40 mg total) by mouth daily.  30 tablet  5  . potassium chloride SA (KLOR-CON M20) 20 MEQ tablet Take 1 tablet (20 mEq total) by mouth as directed. HOLD      . spironolactone (ALDACTONE) 25 MG tablet Take 1 tablet (25 mg total) by mouth daily.  30 tablet  11   No current facility-administered medications for this visit.    No Known Allergies  Review of Systems negative except from HPI and PMH  Physical Exam BP 128/83  Pulse 63  Ht 5' 7.5" (1.715 m)  Wt 188 lb 6.4 oz (85.458 kg)  BMI 29.06 kg/m2 Well developed and well nourished  in no acute distress HENT normal E scleral and icterus clear Neck Supple JVP flat; carotids brisk and full Clear to ausculation Irregular rate and rhythm, no murmurs gallops or rub Soft with active bowel sounds No clubbing cyanosis none Edema Alert and oriented, grossly normal motor and sensory function Skin Warm and Dry  ECG demonstrates atrial fibrillation/flutter  Assessment and  Plan    1. Nonischemic cardiomyopathy. We'll continue guideline directed medical therapy and up titrate carvedilol 18.75--25 mg twice daily. He did not tolerate hydralazine nitrates. Pleasantly, his AV valvular regurgitation is much improved. He'll need a repeat metabolic profile follow his potassium level on Aldactone. We'll have followup PA in 3 months for that  evaluation  2. Atrial fibrillation: Is reverted to sinus rhythm. Left atrial enlargement preclude standard catheter ablation; he may be a candidate for convergent ablation. Interestingly and encouragingly, his QTC has approached normal. Previously he was on dofetilide; these records are not in epic and I will need to explore what it was that prompted the discontinuation.  3. Ventricular tachycardia: No intercurrent ventricular tachycardia  4. Chronic systolic heart failure: Euvolemic.  5. Implantable defibrillator-[employer]-single chamber:The patient's device was interrogated.  The information was reviewed. No changes were made in the programming.    6. Abnormal LFTs: These were improved at last evaluation. We will recheck it again in 3 months.

## 2012-12-16 NOTE — Assessment & Plan Note (Signed)
No intercurrent Ventricular tachycardia  

## 2012-12-16 NOTE — Patient Instructions (Addendum)
Please increase your carvedilol to 25 mg one tablet twice a day. Continue all other medications as listed.  Follow up in 3 months in the [facility].  Please schedule to see Jerene Pitch or Elliott (PA) in 3 months - Dr Caryl Comes would like for you to have blood work at that time (BMP).

## 2012-12-20 ENCOUNTER — Telehealth: Payer: Self-pay | Admitting: *Deleted

## 2012-12-20 NOTE — Telephone Encounter (Signed)
pt notified about lab results with verbal understanding  

## 2013-01-04 ENCOUNTER — Encounter: Payer: Self-pay | Admitting: Internal Medicine

## 2013-02-14 ENCOUNTER — Telehealth: Payer: Self-pay | Admitting: Internal Medicine

## 2013-02-14 NOTE — Telephone Encounter (Signed)
Need 5mg  samples of Eliquis.

## 2013-02-14 NOTE — Telephone Encounter (Signed)
Explained that we do not have any samples at this time. Patient understandable.

## 2013-03-21 ENCOUNTER — Encounter: Payer: Self-pay | Admitting: Cardiology

## 2013-03-21 ENCOUNTER — Ambulatory Visit (INDEPENDENT_AMBULATORY_CARE_PROVIDER_SITE_OTHER): Payer: 59 | Admitting: Cardiology

## 2013-03-21 VITALS — BP 118/74 | HR 83 | Ht 67.5 in | Wt 190.0 lb

## 2013-03-21 DIAGNOSIS — I428 Other cardiomyopathies: Secondary | ICD-10-CM

## 2013-03-21 DIAGNOSIS — Z8674 Personal history of sudden cardiac arrest: Secondary | ICD-10-CM

## 2013-03-21 DIAGNOSIS — Z95810 Presence of automatic (implantable) cardiac defibrillator: Secondary | ICD-10-CM

## 2013-03-21 DIAGNOSIS — I5022 Chronic systolic (congestive) heart failure: Secondary | ICD-10-CM

## 2013-03-21 DIAGNOSIS — I472 Ventricular tachycardia, unspecified: Secondary | ICD-10-CM

## 2013-03-21 DIAGNOSIS — I4891 Unspecified atrial fibrillation: Secondary | ICD-10-CM

## 2013-03-21 DIAGNOSIS — R7989 Other specified abnormal findings of blood chemistry: Secondary | ICD-10-CM

## 2013-03-21 DIAGNOSIS — I4729 Other ventricular tachycardia: Secondary | ICD-10-CM

## 2013-03-21 LAB — MDC_IDC_ENUM_SESS_TYPE_INCLINIC
Lead Channel Impedance Value: 806 Ohm
Lead Channel Pacing Threshold Pulse Width: 0.4 ms
Lead Channel Setting Pacing Pulse Width: 0.4 ms

## 2013-03-21 NOTE — Patient Instructions (Addendum)
Your physician recommends that you return for lab work in: [place] recommends that you continue on your current medications as directed. Please refer to the Current Medication list given to you today.

## 2013-03-22 LAB — COMPREHENSIVE METABOLIC PANEL
ALT: 23 U/L (ref 0–53)
BUN: 22 mg/dL (ref 6–23)
CO2: 28 mEq/L (ref 19–32)
Calcium: 9.2 mg/dL (ref 8.4–10.5)
Chloride: 106 mEq/L (ref 96–112)
Creatinine, Ser: 1.7 mg/dL — ABNORMAL HIGH (ref 0.4–1.5)
GFR: 56.02 mL/min — ABNORMAL LOW (ref >60.00)

## 2013-03-22 LAB — BASIC METABOLIC PANEL
Chloride: 106 mEq/L (ref 96–112)
Creatinine, Ser: 1.7 mg/dL — ABNORMAL HIGH (ref 0.4–1.5)
GFR: 56.02 mL/min — ABNORMAL LOW (ref >60.00)

## 2013-03-26 NOTE — Progress Notes (Signed)
ELECTROPHYSIOLOGY OFFICE NOTE  Patient ID: [name] MRN: [id number], DOB/AGE: Aug 11, 1970   Date of Visit: 03/21/2013  Primary Cardiologist: Jolyn Nap, MD Reason for Visit: EP/device follow-up  History of Present Illness  [name] is a 42 y.o. male      Seen in followup for nonischemic cardiomyopathy and aborted sudden cardiac death s/p ICD implant. He also has paroxysmal atrial fibrillation for which he previously took amiodarone. He had elevated LFTs and amiodarone was discontinued. Last echo July 2014 >> EF 25-30, mild MR, severe LAE, mild RAE, mild TR. Hydralazine and nitrates were added to his medical regimen. However he could not tolerate these due to significant headache. Both medications were discontinued.  He was last seen by Dr. Caryl Comes in August 2014 at which time he was doing well without complaints. Dr. Caryl Comes up-titrated his carvedilol dose to 25 mg twice daily and added spironolactone. He presents today for routine electrophysiology followup.   Since last being seen in our clinic, he reports he is doing well and has no complaints. He denies chest pain or shortness of breath. He denies palpitations, dizziness, near syncope or syncope. He denies LE swelling, orthopnea or PND. He is compliant and tolerating medications without difficulty.  Past Medical History Past Medical History  Diagnosis Date  . Chronic systolic heart failure     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%)  . Atrial fibrillation -parosysmal      Rx w amiodarone  . Noncompliance     H/O  MEDICAL NONCOMPLIANCE  . 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 Tricuspid valve regurgitation     SEVERE  . Severe mitral regurgitation   . Polymorphic ventricular tachycardia     RECURRENT WITH APPROPRIATE SHOCK THERAPY IN THE PAST  . Ventricular fibrillation     WITH APPROPRIATE SHOCK THERAPY IN THE PAST  . Hypertension   . Gout   . RA (rheumatoid  arthritis)   . Automatic implantable cardiac defibrillator -BSX     single chamber  . GI bleed -massive     11 Units 2012  . Elevated LFTs   . H/O hyperthyroidism     Past Surgical History Past Surgical History  Procedure Laterality Date  . Cardiac catheterization  06/2006    RIGHT HEART CATH SHOWING SEVERE BIVENTRICUALR CHF WITH MARKED FILLING AND PRESSURES  . Insert / replace / remove pacemaker      GUIDANT HE ICD MODEL 2180, SERIAL # [id number]  . Cholecystectomy      Allergies/Intolerances No Known Allergies  Current Home Medications Current Outpatient Prescriptions  Medication Sig Dispense Refill  . apixaban (ELIQUIS) 5 MG TABS tablet Take 1 tablet (5 mg total) by mouth 2 (two) times daily.  60 tablet  6  . carvedilol (COREG) 25 MG tablet Take 1 tablet (25 mg total) by mouth 2 (two) times daily with a meal.  60 tablet  11  . furosemide (LASIX) 40 MG tablet Take 1 and 1/2 tablets by mouth twice daily  90 tablet  6  . lisinopril (PRINIVIL,ZESTRIL) 40 MG tablet Take 1 tablet (40 mg total) by mouth daily.  30 tablet  5  . spironolactone (ALDACTONE) 25 MG tablet Take 1 tablet (25 mg total) by mouth daily.  30 tablet  11   No current facility-administered medications for this visit.    Social History Social History  . Marital Status: Single   Social History Main Topics  .  Smoking status: Never Smoker   . Smokeless tobacco: Never Used  . Alcohol Use: No  . Drug Use: No   Review of Systems General: No chills, fever, night sweats or weight changes Cardiovascular: No chest pain, dyspnea on exertion, edema, orthopnea, palpitations, paroxysmal nocturnal dyspnea Dermatological: No rash, lesions or masses Respiratory: No cough, dyspnea Urologic: No hematuria, dysuria Abdominal: No nausea, vomiting, diarrhea, bright red blood per rectum, melena, or hematemesis Neurologic: No visual changes, weakness, changes in mental status All other systems reviewed and are otherwise negative  except as noted above.  Physical Exam Vitals: Blood pressure 118/74, pulse 83, height 5' 7.5" (1.715 m), weight 190 lb (86.183 kg).  General: Well developed, well appearing 42 y.o. male in no acute distress. HEENT: Normocephalic, atraumatic. EOMs intact. Sclera nonicteric. Oropharynx clear.  Neck: Supple without bruits. No JVD. Lungs: Respirations regular and unlabored, CTA bilaterally. No wheezes, rales or rhonchi. Heart: Irregular. S1, S2 present. No murmurs, rub, S3 or S4. Abdomen: Soft, non-tender, non-distended. BS present x 4 quadrants. No hepatosplenomegaly.  Extremities: No clubbing, cyanosis or edema. PT/Radials 2+ and equal bilaterally. Psych: Normal affect. Neuro: Alert and oriented X 3. Moves all extremities spontaneously.   Diagnostics Echocardiogram July 2014 Study Conclusions - Left ventricle: LVEF is approximately 25 to 30% with diffuse hypokinesis, apical akinesis. Basal anterior and lateral wall contract the best. The cavity size was moderately dilated. Wall thickness was increased in a pattern of mild LVH. - Mitral valve: Mild regurgitation. - Left atrium: The atrium was severely dilated. - Right ventricle: Systolic function was moderately reduced. - Pulmonary arteries: PA peak pressure: 57mm Hg (S). 12-lead ECG today - atrial fibrillation / flutter at 72 bpm; difficult to assess QTc Device interrogation today - normal ICD function with good battery status and stable lead measurements; no episodes / ventricular arrhythmias; no programming changes made; see [employer] report for full details  Assessment and Plan 1. Aborted SCD s/p ICD implant - normal device function - no episodes / ventricular arrhythmias - no programming changes made - enroll for remote ICD follow-up every 3 months - return for follow-up with Dr. Caryl Comes in 6 months 2. Nonischemic CM, EF 25-30%, with chronic systolic HF - stable; euvolemic by exam - on optimal medical therapy - unable to tolerate  hydralazine and nitrates previously - will repeat metabolic profile today (will order CMET to eval both renal function and potassium in addition to LFTs as he had elevated transaminases on amiodarone which has been discontinued) 3. Atrial fibrillation/flutter - in AF today but rate controlled and asymptomatic - continue BB for rate control - continue Eliquis for stroke prevention  This plan of care was discussed and formulated with Dr. Caryl Comes. Signed, Ileene Hutchinson, PA-C 03/26/2013, 9:57 PM

## 2013-03-31 ENCOUNTER — Other Ambulatory Visit: Payer: Self-pay | Admitting: *Deleted

## 2013-03-31 DIAGNOSIS — R899 Unspecified abnormal finding in specimens from other organs, systems and tissues: Secondary | ICD-10-CM

## 2013-04-04 ENCOUNTER — Other Ambulatory Visit: Payer: Self-pay

## 2013-04-04 MED ORDER — LISINOPRIL 40 MG PO TABS: 40.0000 mg | ORAL_TABLET | Freq: Every day | ORAL | Status: DC

## 2013-04-07 ENCOUNTER — Other Ambulatory Visit: Payer: 59

## 2013-04-17 ENCOUNTER — Encounter: Payer: Self-pay | Admitting: Internal Medicine

## 2013-06-05 ENCOUNTER — Other Ambulatory Visit: Payer: Self-pay

## 2013-06-05 DIAGNOSIS — I428 Other cardiomyopathies: Secondary | ICD-10-CM

## 2013-06-05 MED ORDER — CARVEDILOL 25 MG PO TABS: 25.0000 mg | ORAL_TABLET | Freq: Two times a day (BID) | ORAL | Status: DC

## 2013-06-23 ENCOUNTER — Ambulatory Visit (INDEPENDENT_AMBULATORY_CARE_PROVIDER_SITE_OTHER): Payer: 59 | Admitting: *Deleted

## 2013-06-23 DIAGNOSIS — I428 Other cardiomyopathies: Secondary | ICD-10-CM

## 2013-06-23 DIAGNOSIS — I4891 Unspecified atrial fibrillation: Secondary | ICD-10-CM

## 2013-06-23 DIAGNOSIS — I472 Ventricular tachycardia, unspecified: Secondary | ICD-10-CM

## 2013-06-23 DIAGNOSIS — I4729 Other ventricular tachycardia: Secondary | ICD-10-CM

## 2013-06-23 LAB — MDC_IDC_ENUM_SESS_TYPE_INCLINIC
Date Time Interrogation Session: 20150306050000
HIGH POWER IMPEDANCE MEASURED VALUE: 43 Ohm
HIGH POWER IMPEDANCE MEASURED VALUE: 56 Ohm
Implantable Pulse Generator Serial Number: 100391
Lead Channel Impedance Value: 826 Ohm
Lead Channel Pacing Threshold Amplitude: 0.7 V
Lead Channel Sensing Intrinsic Amplitude: 18.7 mV
Lead Channel Setting Sensing Sensitivity: 0.5 mV
MDC IDC MSMT LEADCHNL RV PACING THRESHOLD PULSEWIDTH: 0.4 ms
MDC IDC SET LEADCHNL RV PACING AMPLITUDE: 2 V
MDC IDC SET LEADCHNL RV PACING PULSEWIDTH: 0.4 ms
MDC IDC SET ZONE DETECTION INTERVAL: 273 ms
MDC IDC STAT BRADY RV PERCENT PACED: 1 % — AB

## 2013-06-23 NOTE — Progress Notes (Signed)
ICD check in clinic. Normal device function. Thresholds and sensing consistent with previous device measurements. Impedance trends stable over time. 1 NSVT---6 beats. Histogram distribution appropriate for patient and level of activity. No changes made this session. Device programmed at appropriate safety margins. Device programmed to optimize intrinsic conduction. Estimated longevity 102yrs. ROV w/ device clinic 09/27/13 @ 3:00 & w/ SK in 12mo.

## 2013-07-25 ENCOUNTER — Encounter: Payer: Self-pay | Admitting: Internal Medicine

## 2013-09-27 ENCOUNTER — Ambulatory Visit (INDEPENDENT_AMBULATORY_CARE_PROVIDER_SITE_OTHER): Payer: 59 | Admitting: *Deleted

## 2013-09-27 DIAGNOSIS — I4891 Unspecified atrial fibrillation: Secondary | ICD-10-CM

## 2013-09-27 MED ORDER — APIXABAN 5 MG PO TABS: 5.0000 mg | ORAL_TABLET | Freq: Two times a day (BID) | ORAL | Status: DC

## 2013-09-28 LAB — MDC_IDC_ENUM_SESS_TYPE_INCLINIC
Date Time Interrogation Session: 20150306050000
HIGH POWER IMPEDANCE MEASURED VALUE: 43 Ohm
HIGH POWER IMPEDANCE MEASURED VALUE: 56 Ohm
Implantable Pulse Generator Serial Number: 100391
Lead Channel Impedance Value: 826 Ohm
Lead Channel Pacing Threshold Amplitude: 0.7 V
Lead Channel Sensing Intrinsic Amplitude: 18.7 mV
Lead Channel Setting Pacing Amplitude: 2 V
Lead Channel Setting Pacing Pulse Width: 0.4 ms
MDC IDC MSMT LEADCHNL RV PACING THRESHOLD PULSEWIDTH: 0.4 ms
MDC IDC SET LEADCHNL RV SENSING SENSITIVITY: 0.5 mV
MDC IDC SET ZONE DETECTION INTERVAL: 273 ms
MDC IDC STAT BRADY RV PERCENT PACED: 1 % — AB

## 2013-09-28 NOTE — Progress Notes (Signed)
ICD check in clinic. Normal device function. Thresholds and sensing consistent with previous device measurements. Impedance trends stable over time. No evidence of any ventricular arrhythmias. Histogram distribution appropriate for patient and level of activity. No changes made this session. Device programmed at appropriate safety margins. Device programmed to optimize intrinsic conduction. Estimated longevity 29yrs. ROV w/ GT 12/28/13.

## 2013-10-01 ENCOUNTER — Encounter (HOSPITAL_COMMUNITY): Payer: Self-pay | Admitting: Emergency Medicine

## 2013-10-01 ENCOUNTER — Emergency Department (HOSPITAL_COMMUNITY)
Admission: EM | Admit: 2013-10-01 | Discharge: 2013-10-02 | Disposition: A | Discharge status: Home / Self Care | Payer: 59 | Attending: Emergency Medicine | Admitting: Emergency Medicine

## 2013-10-01 DIAGNOSIS — Z9119 Patient's noncompliance with other medical treatment and regimen: Secondary | ICD-10-CM | POA: Insufficient documentation

## 2013-10-01 DIAGNOSIS — Z8739 Personal history of other diseases of the musculoskeletal system and connective tissue: Secondary | ICD-10-CM | POA: Insufficient documentation

## 2013-10-01 DIAGNOSIS — I10 Essential (primary) hypertension: Secondary | ICD-10-CM | POA: Insufficient documentation

## 2013-10-01 DIAGNOSIS — Z95810 Presence of automatic (implantable) cardiac defibrillator: Secondary | ICD-10-CM | POA: Insufficient documentation

## 2013-10-01 DIAGNOSIS — Z91199 Patient's noncompliance with other medical treatment and regimen due to unspecified reason: Secondary | ICD-10-CM | POA: Insufficient documentation

## 2013-10-01 DIAGNOSIS — Z8719 Personal history of other diseases of the digestive system: Secondary | ICD-10-CM | POA: Insufficient documentation

## 2013-10-01 DIAGNOSIS — Z950 Presence of cardiac pacemaker: Secondary | ICD-10-CM | POA: Insufficient documentation

## 2013-10-01 DIAGNOSIS — M109 Gout, unspecified: Secondary | ICD-10-CM | POA: Insufficient documentation

## 2013-10-01 DIAGNOSIS — Z79899 Other long term (current) drug therapy: Secondary | ICD-10-CM | POA: Insufficient documentation

## 2013-10-01 DIAGNOSIS — I5022 Chronic systolic (congestive) heart failure: Secondary | ICD-10-CM | POA: Insufficient documentation

## 2013-10-01 DIAGNOSIS — Z9889 Other specified postprocedural states: Secondary | ICD-10-CM | POA: Insufficient documentation

## 2013-10-01 DIAGNOSIS — I4891 Unspecified atrial fibrillation: Secondary | ICD-10-CM | POA: Insufficient documentation

## 2013-10-01 NOTE — ED Notes (Addendum)
Pt arrived to the ED with a complaint of right hand pain and swelling.  Pt states he has a hx of gout and believes the pain is similar to the pain he has experienced in the past.  Pt states that the hand feels stiff and tight.  Hand is visibly swollen.  Pt states that the condition has existed for 3 days but has intensified today.  Pt has a significant cardiac history and has a defibrillator in place but has not felt it go off in a long time

## 2013-10-02 LAB — BASIC METABOLIC PANEL
BUN: 27 mg/dL — AB (ref 6–23)
CHLORIDE: 100 meq/L (ref 96–112)
CO2: 25 meq/L (ref 19–32)
Calcium: 9.2 mg/dL (ref 8.4–10.5)
Creatinine, Ser: 1.53 mg/dL — ABNORMAL HIGH (ref 0.50–1.35)
GFR calc Af Amer: 62 mL/min — ABNORMAL LOW (ref >90)
GFR, EST NON AFRICAN AMERICAN: 53 mL/min — AB (ref >90)
GLUCOSE: 97 mg/dL (ref 70–99)
POTASSIUM: 4.1 meq/L (ref 3.7–5.3)
SODIUM: 137 meq/L (ref 137–147)

## 2013-10-02 LAB — CBC WITH DIFFERENTIAL/PLATELET
BASOS PCT: 0 % (ref 0–1)
Basophils Absolute: 0 10*3/uL (ref 0.0–0.1)
EOS ABS: 0 10*3/uL (ref 0.0–0.7)
EOS PCT: 0 % (ref 0–5)
HEMATOCRIT: 40.7 % (ref 39.0–52.0)
HEMOGLOBIN: 13.1 g/dL (ref 13.0–17.0)
LYMPHS PCT: 30 % (ref 12–46)
Lymphs Abs: 2.6 10*3/uL (ref 0.7–4.0)
MCH: 21.7 pg — ABNORMAL LOW (ref 26.0–34.0)
MCHC: 32.2 g/dL (ref 30.0–36.0)
MCV: 67.5 fL — ABNORMAL LOW (ref 78.0–100.0)
Monocytes Absolute: 0.9 10*3/uL (ref 0.1–1.0)
Monocytes Relative: 11 % (ref 3–12)
NEUTROS ABS: 5 10*3/uL (ref 1.7–7.7)
Neutrophils Relative %: 59 % (ref 43–77)
Platelets: 275 10*3/uL (ref 150–400)
RBC: 6.03 MIL/uL — AB (ref 4.22–5.81)
RDW: 16.4 % — ABNORMAL HIGH (ref 11.5–15.5)
WBC: 8.5 10*3/uL (ref 4.0–10.5)

## 2013-10-02 LAB — SEDIMENTATION RATE: Sed Rate: 17 mm/hr — ABNORMAL HIGH (ref 0–16)

## 2013-10-02 MED ORDER — PREDNISONE 20 MG PO TABS: 40.0000 mg | ORAL_TABLET | Freq: Every day | ORAL | Status: DC

## 2013-10-02 MED ORDER — COLCHICINE 0.6 MG PO TABS: 0.6000 mg | ORAL_TABLET | Freq: Every day | ORAL | Status: DC

## 2013-10-02 MED ORDER — OXYCODONE-ACETAMINOPHEN 5-325 MG PO TABS: 1.0000 | ORAL_TABLET | Freq: Four times a day (QID) | ORAL | Status: DC | PRN

## 2013-10-02 NOTE — ED Provider Notes (Signed)
CSN: [id number]     Arrival date & time 10/01/13  2230 History   First MD Initiated Contact with Patient 10/01/13 2359     Chief Complaint  Patient presents with  . Hand Pain     (Consider location/radiation/quality/duration/timing/severity/associated sxs/prior Treatment) HPI Comments: Patient with a history of Gout presents today with a chief complaint of pain and swelling of the right wrist and right hand.  He reports that the swelling and pain has been present for the past 3 days and is gradually worsening.  He states that the pain and swelling is similar to what he has had in the past with a Gout flare up.  He denies any injury or trauma.  He denies any fever or chills.  Oral temperature is 100.2 upon arrival in the ED.  He denies nausea or vomiting.  He is currently not on any prophylactic medication for Gout.  He denies any numbness or tingling.  Denies any other symptoms at this time.    Patient is a 43 y.o. male presenting with hand pain. The history is provided by the patient.  Hand Pain    Past Medical History  Diagnosis Date  . Chronic systolic heart failure     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%)  . Atrial fibrillation -parosysmal      Rx w amiodarone  . Noncompliance     H/O  MEDICAL NONCOMPLIANCE  . 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 Tricuspid valve regurgitation     SEVERE  . Severe mitral regurgitation   . Polymorphic ventricular tachycardia     RECURRENT WITH APPROPRIATE SHOCK THERAPY IN THE PAST  . Ventricular fibrillation     WITH APPROPRIATE SHOCK THERAPY IN THE PAST  . Hypertension   . Gout   . RA (rheumatoid arthritis)   . Automatic implantable cardiac defibrillator -BSX     single chamber  . GI bleed -massive     11 Units 2012  . Elevated LFTs   . H/O hyperthyroidism    Past Surgical History  Procedure Laterality Date  . Cardiac catheterization  06/2006    RIGHT HEART CATH SHOWING SEVERE  BIVENTRICUALR CHF WITH MARKED FILLING AND PRESSURES  . Insert / replace / remove pacemaker      GUIDANT HE ICD MODEL 2180, SERIAL # [id number]  . Cholecystectomy     Family History  Problem Relation Age of Onset  . Heart failure Brother    History  Substance Use Topics  . Smoking status: Never Smoker   . Smokeless tobacco: Never Used  . Alcohol Use: No    Review of Systems  All other systems reviewed and are negative.     Allergies  Review of patient's allergies indicates no known allergies.  Home Medications   Prior to Admission medications   Medication Sig Start Date End Date Taking? Authorizing Provider  apixaban (ELIQUIS) 5 MG TABS tablet Take 1 tablet (5 mg total) by mouth 2 (two) times daily. 09/27/13  Yes Deboraha Sprang, MD  carvedilol (COREG) 25 MG tablet Take 1 tablet (25 mg total) by mouth 2 (two) times daily with a meal. 06/05/13  Yes Deboraha Sprang, MD  furosemide (LASIX) 40 MG tablet Take 40 mg by mouth daily.   Yes Historical Provider, MD  lisinopril (PRINIVIL,ZESTRIL) 40 MG tablet Take 1 tablet (40 mg total) by mouth daily. 04/04/13  Yes Thayer Headings, MD  spironolactone (ALDACTONE) 25 MG  tablet Take 1 tablet (25 mg total) by mouth daily. 09/30/12  Yes Scott T Weaver, PA-C   BP 126/72  Pulse 98  Temp(Src) 99.8 F (37.7 C) (Oral)  Resp 16  SpO2 100% Physical Exam  Nursing note and vitals reviewed. Constitutional: He appears well-developed and well-nourished.  HENT:  Head: Normocephalic and atraumatic.  Mouth/Throat: Oropharynx is clear and moist.  Cardiovascular: Normal rate, regular rhythm and normal heart sounds.   Pulses:      Radial pulses are 2+ on the right side, and 2+ on the left side.  Pulmonary/Chest: Effort normal and breath sounds normal.  Musculoskeletal:       Right elbow: He exhibits normal range of motion and no swelling. No tenderness found.  Patient with diffuse swelling of his right wrist and right hand.  Limited ROM of the right wrist  and the fingers of the right hand.  Right wrist warm to the touch.    Neurological: He is alert.  Distal sensation of the right fingers intact  Skin: Skin is warm and dry.  Psychiatric: He has a normal mood and affect.    ED Course  Procedures (including critical care time) Labs Review Labs Reviewed  CBC WITH DIFFERENTIAL  BASIC METABOLIC PANEL  SEDIMENTATION RATE    Imaging Review No results found.   EKG Interpretation None      MDM   Final diagnoses:  None   Patient with a history of Gout presents today with pain and swelling of the right wrist and hand.  No leukocytosis.  Signs and symptoms consistent with Gout flare up.  Patient given Rx for Colchicine, Percocet, and Prednisone.  Patient also evaluated by Dr. Sharol Given also who agrees with the plan.     Hyman Bible, PA-C 10/02/13 [phone]

## 2013-10-02 NOTE — ED Provider Notes (Signed)
Medical screening examination/treatment/procedure(s) were conducted as a shared visit with non-physician practitioner(s) and myself.  I personally evaluated the patient during the encounter.   EKG Interpretation None     Pt seen with PA, h/o gout, and exam c/w gout.  Has renal insufficiency, will avoid indocin.  F/u resources given  Kalman Drape, MD 10/02/13 [phone]

## 2013-10-02 NOTE — Discharge Instructions (Signed)
°Emergency Department Resource Guide °1) Find a Doctor and Pay Out of Pocket °Although you won't have to find out who is covered by your insurance plan, it is a good idea to ask around and get recommendations. You will then need to call the office and see if the doctor you have chosen will accept you as a new patient and what types of options they offer for patients who are self-pay. Some doctors offer discounts or will set up payment plans for their patients who do not have insurance, but you will need to ask so you aren't surprised when you get to your appointment. ° °2) Contact Your Local Health Department °Not all health departments have doctors that can see patients for sick visits, but many do, so it is worth a call to see if yours does. If you don't know where your local health department is, you can check in your phone book. The CDC also has a tool to help you locate your state's health department, and many state websites also have listings of all of their local health departments. ° °3) Find a Walk-in Clinic °If your illness is not likely to be very severe or complicated, you may want to try a walk in clinic. These are popping up all over the country in pharmacies, drugstores, and shopping centers. They're usually staffed by nurse practitioners or physician assistants that have been trained to treat common illnesses and complaints. They're usually fairly quick and inexpensive. However, if you have serious medical issues or chronic medical problems, these are probably not your best option. ° °No Primary Care Doctor: °- Call [facility] at  832-8000 - they can help you locate a primary care doctor that  accepts your insurance, provides certain services, etc. °- Physician Referral Service- 1-800-533-3463 ° °Chronic Pain Problems: °Organization         Address  Phone   Notes  °[facility]  (336) 297-2271 Patients need to be referred by their primary care doctor.  ° °Medication  Assistance: °Organization         Address  Phone   Notes  °[place] [facility] [address] °[place], [place] [address] (336) 641-8030 --Must be a resident of [place] °-- Must have NO insurance coverage whatsoever (no Medicaid/ Medicare, etc.) °-- The pt. MUST have a primary care doctor that directs their care regularly and follows them in the community °  °[facility]  (866) 331-1348   °[employer]  (888) 892-1162   ° °Agencies that provide inexpensive medical care: °Organization         Address  Phone   Notes  °[facility]  (336) 832-8035   °[facility]    (336) 832-7272   °[facility] [address] °[place], [place] [address] (336) 832-4777   °[facility] [address], °[place] (336) 271-4999   °[facility]    (336) 373-0678   °[facility]    (336) 272-1050   °[facility] ° [address], [place] Phone:  (336) 832-4444, Fax:  (336) 832-4440 Hours of Operation:  9 am - 6 pm, M-F.  Also accepts Medicaid/Medicare and self-pay.  °[facility] ° [address], [place] Phone: (336) 832-3150, Fax: (336) [phone]. Hours of Operation:  8:30 am - 5:30 pm, M-F.  Also accepts Medicaid and self-pay.  °[facility] [place] [address], [place] Phone: (336) 878-6027   °[facility] [address], [place], [place] (336)723-1848, Ext. 123 Mondays & Thursdays: 7-9 AM.  First 15 patients are seen on a first come, first serve basis. °  ° °Medicaid-accepting [place] Providers: ° °Organization         Address  Phone   Notes  °[facility] [address], [place] (336) 641-2100 Also accepts self-pay patients.  °[facility] [address], [place] ° (336) 856-9996   °[facility] [address], [place]  (336) 288-8857   °[facility] [address] [place] [address], [place] (336) 299-7000   °Veita Bland [address], [place]  ° (336) 373-1557 Only accepts [place] Access Medicaid patients after they have their name applied to their card.  ° °Self-Pay (no insurance) in [place]: ° °Organization         Address  Phone   Notes  °Sickle Cell Patients, [facility] [address], [place] (336) 832-1970   °[facility] [address], [place] (336) 832-4400   °[facility] ° [address], [place] (336) 992-4800   °[facility]/Dr. Osei-Bonsu ° [address] [place] [address], [place] or [address], [place] (336) [phone] Phone number for both [place] and [place] locations is the same.  °[facility] [address], [place] (336) 299-0000   °[facility] [address] [place] [address], [place] or [address] (336) 852-7530 °(336) 878-2260   °[facility] [address], [place] (336) 350-1642, phone; (336) 294-5005, fax Sees patients 1st and 3rd Saturday of every month.  Must not qualify for public or private insurance (i.e. Medicaid, Medicare, [place] Health Choice, Veterans' Benefits) • Household income should be no more than 200% of the poverty level •The clinic cannot treat you if you are pregnant or think you are pregnant • Sexually transmitted diseases are not treated at the clinic.  ° ° °Dental Care: °Organization         Address  Phone  Notes  °[place] [facility] [address], [place] (336) 641-6152 Accepts children up to age 21 who are enrolled in Medicaid or [place] Health Choice; pregnant women with a Medicaid card; and children who have applied for Medicaid or [place] Health Choice, but were declined, whose parents can pay a reduced fee at time of service.  °[place]  [facility] [place]  [address], [place] (336) 641-7733 Accepts children up to age 21 who are enrolled in Medicaid or [place] Health Choice; pregnant women with a Medicaid card; and children who have applied for Medicaid or [place] Health Choice, but were declined, whose parents can pay a reduced fee at time of service.  °[facility] ° [address], [place] (336) 641-4533 Patients are seen by appointment only. Walk-ins are not accepted. [facility] will see patients 18 years of age and older. °Monday - Tuesday (8am-5pm) °Most Wednesdays (8:30-5pm) °$30 per visit, cash only  °[facility] ° [address], [place] (336) 641-4533 Patients are seen by appointment only. Walk-ins are not accepted. [facility] will see patients 18 years of age and older. °One   Wednesday Evening (Monthly: Volunteer Based).  $30 per visit, cash only  °[facility]  (919) 537-3737 for adults; Children under age 4, call Graduate Pediatric Dentistry at (919) [phone]. Children aged 4-14, please call (919) [phone] to request a pediatric application. ° Dental services are provided in all areas of dental care including fillings, crowns and bridges, complete and partial dentures, implants, gum treatment, root canals, and extractions. Preventive care is also provided. Treatment is provided to both adults and children. °Patients are selected via a lottery and there is often a waiting list. °  °[facility] [address], °[place] ° (336) 763-8833 [email or web address] °  °[facility] [address], [place], [place] (336)723-1848, Ext. 123 Second and Fourth Thursday of each month, opens at 6:30 AM; Clinic ends at 9 AM.  Patients are seen on a first-come first-served basis, and a limited number are seen during each clinic.  ° °[facility] ° [address], [place], [place] (336) 723-7904    Eligibility Requirements °You must have lived in [place], [place], or [place] counties for at least the last three months. °  You cannot be eligible for state or federal sponsored healthcare insurance, including Veterans Administration, Medicaid, or Medicare. °  You generally cannot be eligible for healthcare insurance through your employer.  °  How to apply: °Eligibility screenings are held every Tuesday and Wednesday afternoon from 1:00 pm until 4:00 pm. You do not need an appointment for the interview!  °[facility] [address], [place], [place] 336-631-2330   °[facility]  336-342-8273   °[facility]  336-703-3100   °[facility]  336-570-6415   ° °Behavioral Health Resources in the Community: °Intensive Outpatient Programs °Organization         Address  Phone  Notes  °[facility] [address], [place], [place] 336-878-6098   °[facility] [address], [place], [place] 336-832-9800   °[facility] [address], [place], [place] ° 336-882-2125   °[facility] [address],  °[place], [place] 1-800-853-5163 or 336-641-4981   °Substance Abuse Resources °Organization         Address  Phone  Notes  °[facility]  336-882-2125   °[facility]  336-784-9470   °[facility]  336-285-9073   °[facility]  336-845-3988   °Residential & Outpatient Substance Abuse Program  1-800-659-3381   °Psychological Services °Organization         Address  Phone  Notes  °[facility]  336- 832-9600   °[facility]  336- 378-7881   °[facility] [address], [place] 1-800-853-5163 or 336-641-4981   ° °Mobile Crisis Teams °Organization         Address  Phone  Notes  °[facility], Mobile Crisis Care Unit  1-877-626-1772   °Assertive °[facility] ° [address]  [place], [place] 336-834-9664   °Sharon DeEsch [address] °[place] 336-554-5454   ° °Self-Help/Support Groups °Organization         Address  Phone             Notes  °[facility] - variety of support groups  336- 373-1402 Call for more information  °Narcotics Anonymous (NA), [facility] [address], °[place]  2 meetings at this location  ° °  Residential Treatment Programs °Organization         Address  Phone  Notes  °[facility] [address],    °[place]  1-866-801-8205   °[facility] ° [address], [place], [place] 704-293-8524   °[facility] [address], [place] 336-845-3988 Admissions: 8am-3pm M-F  °[facility] [address]    °[place], [place] 336-841-1104   °[facility] [address], [place], [place] 336-379-7146   °[facility] [address]  °[place], [place] 336-285-9073   °[facility] - Intensive Outpatient [address], [place], [place] 336-852-3033   °[facility] [address]  °[place], [place] 1-877-615-2722 or 336-784-9470   °[facility] [address] [place], [place] 336-227-7417 Accepts Medicaid  °[facility] [address]  °[place] 1-800-659-3381 Substance Abuse/Addiction Treatment  ° °[place] Behavioral Health Resources °Organization         Address  Phone  Notes  °[facility]  (888) 581-9988   °Julie Brannon, PhD [address] [place], [place]   (336) 349-5553 or (336) 951-0000   °[facility]   [address] °[place], [place] (336) 349-4454   °[facility] [address], [place], [place] (336) 342-8316 Insurance/Medicaid/sponsorship through [facility]  °[facility] [address]                                    [place], [place] (336) 342-8316 Therapy/tele-psych/case    °[facility] [address]  ° [place], [place] (336) 349-2233    °Dr. Arfeen  (336) 349-4544   °[facility] [place]  [employer] [place] [facility] 1) [address], [place] °2) [address], [place] °3)  [address], [place] (336) 349-3220 °(336) 342-7768 ° °(336) 342-8140   °[place] [facility] (336) 342-1394 or (336) 342-3537 (After Hours)    ° ° °

## 2013-10-13 ENCOUNTER — Other Ambulatory Visit: Payer: Self-pay

## 2013-10-13 MED ORDER — LISINOPRIL 40 MG PO TABS: 40.0000 mg | ORAL_TABLET | Freq: Every day | ORAL | Status: DC

## 2013-10-20 ENCOUNTER — Emergency Department (HOSPITAL_COMMUNITY)
Admission: EM | Admit: 2013-10-20 | Discharge: 2013-10-20 | Disposition: A | Discharge status: Home / Self Care | Payer: 59 | Attending: Emergency Medicine | Admitting: Emergency Medicine

## 2013-10-20 ENCOUNTER — Encounter (HOSPITAL_COMMUNITY): Payer: Self-pay | Admitting: Emergency Medicine

## 2013-10-20 DIAGNOSIS — Z9889 Other specified postprocedural states: Secondary | ICD-10-CM | POA: Insufficient documentation

## 2013-10-20 DIAGNOSIS — Z79899 Other long term (current) drug therapy: Secondary | ICD-10-CM | POA: Insufficient documentation

## 2013-10-20 DIAGNOSIS — Z8639 Personal history of other endocrine, nutritional and metabolic disease: Secondary | ICD-10-CM | POA: Insufficient documentation

## 2013-10-20 DIAGNOSIS — Z8719 Personal history of other diseases of the digestive system: Secondary | ICD-10-CM | POA: Insufficient documentation

## 2013-10-20 DIAGNOSIS — I5022 Chronic systolic (congestive) heart failure: Secondary | ICD-10-CM | POA: Insufficient documentation

## 2013-10-20 DIAGNOSIS — I4891 Unspecified atrial fibrillation: Secondary | ICD-10-CM | POA: Insufficient documentation

## 2013-10-20 DIAGNOSIS — I10 Essential (primary) hypertension: Secondary | ICD-10-CM | POA: Insufficient documentation

## 2013-10-20 DIAGNOSIS — Z862 Personal history of diseases of the blood and blood-forming organs and certain disorders involving the immune mechanism: Secondary | ICD-10-CM | POA: Insufficient documentation

## 2013-10-20 DIAGNOSIS — M069 Rheumatoid arthritis, unspecified: Secondary | ICD-10-CM | POA: Insufficient documentation

## 2013-10-20 DIAGNOSIS — M109 Gout, unspecified: Secondary | ICD-10-CM

## 2013-10-20 DIAGNOSIS — Z7902 Long term (current) use of antithrombotics/antiplatelets: Secondary | ICD-10-CM | POA: Insufficient documentation

## 2013-10-20 MED ORDER — PREDNISONE 50 MG PO TABS: 50.0000 mg | ORAL_TABLET | Freq: Every day | ORAL | Status: DC

## 2013-10-20 MED ORDER — HYDROCODONE-ACETAMINOPHEN 5-325 MG PO TABS: 1.0000 | ORAL_TABLET | Freq: Four times a day (QID) | ORAL | Status: DC | PRN

## 2013-10-20 NOTE — Discharge Instructions (Signed)
Return here as needed. Follow up with a primary care doctor.    Emergency Department Resource Guide 1) Find a Doctor and Pay Out of Pocket Although you won't have to find out who is covered by your insurance plan, it is a good idea to ask around and get recommendations. You will then need to call the office and see if the doctor you have chosen will accept you as a new patient and what types of options they offer for patients who are self-pay. Some doctors offer discounts or will set up payment plans for their patients who do not have insurance, but you will need to ask so you aren't surprised when you get to your appointment.  2) Contact Your Local Health Department Not all health departments have doctors that can see patients for sick visits, but many do, so it is worth a call to see if yours does. If you don't know where your local health department is, you can check in your phone book. The CDC also has a tool to help you locate your state's health department, and many state websites also have listings of all of their local health departments.  3) Find a [facility] If your illness is not likely to be very severe or complicated, you may want to try a walk in clinic. These are popping up all over the country in pharmacies, drugstores, and shopping centers. They're usually staffed by nurse practitioners or physician assistants that have been trained to treat common illnesses and complaints. They're usually fairly quick and inexpensive. However, if you have serious medical issues or chronic medical problems, these are probably not your best option.  No Primary Care Doctor: - Call Health Connect at  [phone] - they can help you locate a primary care doctor that  accepts your insurance, provides certain services, etc. - Physician Referral Service- [phone]  Chronic Pain Problems: Organization         Address  Phone   Notes  [facility]  [phone] Patients need to  be referred by their primary care doctor.   Medication Assistance: Organization         Address  Phone   Notes  [facility] Medication [facility] [place], [place] [address] [phone] --Must be a resident of [facility] -- Must have NO insurance coverage whatsoever (no Medicaid/ Medicare, etc.) -- The pt. MUST have a primary care doctor that directs their care regularly and follows them in the community   [facility]  [phone]   [employer]  [phone]    Agencies that provide inexpensive medical care: Organization         Address  Phone   Notes  [place]  [phone]   [facility]    [phone]   [facility] [place], [place] [address] [phone]   [place] [address], [place] [phone]   Planned Parenthood    [phone]   [facility]    [phone]   [place] and [address], [place] Phone:  [phone], Fax:  [phone] Hours of Operation:  9 am - 6 pm, M-F.  Also accepts Medicaid/Medicare and self-pay.  [facility] for [place] [address], [address], [place] Phone: [phone], Fax: [phone]. Hours of Operation:  8:30 am -  5:30 pm, M-F.  Also accepts Medicaid and self-pay.  [facility] [place] [address] [place] Phone: [phone]   [place], [place], [place] [phone], Ext. [phone] Mondays & Thursdays: 7-9 AM.  First 15 patients are seen on a first come, first serve basis.    [place] Providers:  Organization         Address  Phone   Notes  [facility] [address], [place] [phone] Also accepts self-pay patients.  [facility] [id number] [place], Center  [phone]   [place], [address], [place] [phone]   [facility] [address], [place] [phone]   Lucianne Lei [address], [place]   [phone] Only accepts [place] Access [place] patients after they have their name applied to their card.   Self-Pay (no insurance) in [facility]:  Organization         Address  Phone   Notes  Sickle Cell Patients, [facility] [place] [phone]   [facility] Urgent Care [place] [phone]   [facility] [address]  [place], [place], [place] [phone]   [facility]/Dr. Osei-Bonsu  [address] [place] or [address], [place] [phone] Phone number for both [place] and [place] locations is the same.  Urgent Medical and [facility] [address], [place] [phone]   [facility] [address] [place] or [address] [phone]   [facility] [address], [place] [phone], phone; [phone], fax Sees patients 1st and 3rd Saturday of every month.  Must not qualify for public or private insurance (i.e. Medicaid, Medicare, [place] Health Choice, Veterans' Benefits)  Household income should be no more than 200% of the poverty level The clinic cannot treat you if you are pregnant or think you are pregnant  Sexually transmitted diseases are not treated at the clinic.    Dental Care: Organization         Address  Phone  Notes  [facility] Department of [facility] [place] [phone] Accepts children up to age 25 who are enrolled in [place] or [place]; pregnant women with a Medicaid card; and children who have applied for Medicaid or [place] Health Choice, but were declined, whose parents can  pay a reduced fee at time of service.  [facility] Department of [facility]  [address], [place] [phone] Accepts children up to age 62 who are enrolled in [place] or [place]; pregnant women with a Medicaid card; and children who have applied for Medicaid or [place] Health Choice, but were declined, whose parents can pay a reduced fee at time of service.  [facility] Access PRO[facility]  [place] [phone] Patients are seen by appointment only. Walk-ins are not accepted. [place] will see patients 33 years of age and older. Monday - Tuesday (8am-5pm) Most Wednesdays (8:30-5pm) $30 per visit, cash only  [facility] Adult Dental Access PROGRAM  [address], [facility] [phone] Patients are seen by appointment only. Walk-ins are  not accepted. [place] will see patients 35 years of age and older. One Wednesday Evening (Monthly: Volunteer Based).  $30 per visit, cash only  [place]  [phone] for adults; Children under age 55, call Graduate Pediatric Dentistry at [phone]. Children aged 98-14, please call [phone] to request a pediatric application.  Dental services are provided in all areas of dental care including fillings, crowns and bridges, complete and partial dentures, implants, gum treatment, root canals, and extractions. Preventive care is also provided. Treatment is provided to both adults and children. Patients are selected via a lottery and there is often a waiting list.   [facility] [address], [place]  [phone] [email or web address]   [facility] [address] [place], [place] [phone], Ext. [phone] Second and Fourth Thursday of each month, opens at 6:30 AM; Clinic ends at 9 AM.  Patients are seen on a first-come first-served basis, and a limited number are seen during each clinic.   [facility]  [address] Hillard Danker [place], [place] [phone]   Eligibility Requirements You must have lived in [place], [place], or [place] counties for at least the last three months.   You cannot be eligible for state or federal sponsored [employer], including [employer], [place], or [employer].   You generally cannot be eligible for healthcare insurance through your employer.    How to apply: Eligibility screenings are held every Tuesday and Wednesday afternoon from 1:00 pm until 4:00 pm. You do not need an appointment for the interview!  [facility] [address], [place], [place]   [place]  [place] Department  [place]  [phone]    Behavioral Health Resources in the Community: Intensive Outpatient Programs Organization         Address  Phone  Notes  [place]. [address], [place], [place] [phone]   [facility] [address] [place], [place]   ADS: [facility] [address], [place], [place]   [place] [address]  [place], [place] or [phone]   Substance Abuse Resources Organization         Address  Phone  Notes  Alcohol and Drug Services  [phone]   [place]  [phone]   The [place]   Chinita Pester  [phone]   Residential & Outpatient Substance Abuse Program  [phone]   Psychological Services Organization         Address  Phone  Notes  [facility] [place]  [place]  [phone]   [place] [address], [place] or [phone]    Mobile Crisis Teams Organization         Address  Phone  Notes  [facility], Mobile Crisis Care Unit  [phone]    [facility]  [address] [place], [place]   [place] Levels [address] [place] [phone]    Self-Help/Support Groups Organization         Address  Phone             Notes  [place]. of [place] - variety of support groups  [place] Call for more information  Narcotics Anonymous (NA),  [facility] [address] [employer] [place]  2 meetings at this location   Residential Facilities manager         Address  Phone  Notes  [facility]idential Treatment [place],    [place]  [phone]   [facility]  [address] [place] [phone], [place], [place]   [place], [place] [phone] Admissions: 8am-3pm M-F  Incentives Substance [place] [address]    [place], [place] [phone]   [facility] [address] [place], [place], [place]   [facility] [address]  [place], [place]   [facility] Programs - Intensive Outpatient [address] Kristeen Mans [address], [place], [place]   [facility] ([place] [address]  [place], [place] [phone] or [phone]   [facility] Treatment Services (RTS) [address] [place], [place] Accepts Medicaid  [facility] [place] [address]  [place] [phone] Substance Abuse/Addiction Treatment   [facility] Organization         Address  Phone  Notes  [facility]  [phone]   Domenic Schwab, PhD [address] [place], [place]   [phone] or [phone]   [place] [address] [place], [place] [phone]   [facility] [address] [place], [place] [phone] Insurance/Medicaid/sponsorship through [facility] and Families [address] [place]                                     [place], [place] [phone] [place] [address], [place] [phone]    Dr. Adele Schilder  [phone]   [facility] Dept. 1) [address] [place] 2) [place] 3)  [address], [place] [phone]  [phone]   [place] [phone] or [phone] (After Hours)

## 2013-10-20 NOTE — ED Provider Notes (Signed)
CSN: [id number]     Arrival date & time 10/20/13  [id number] History   First MD Initiated Contact with Patient 10/20/13 [phone]     Chief Complaint  Patient presents with  . Hand Pain     (Consider location/radiation/quality/duration/timing/severity/associated sxs/prior Treatment) Patient is a 43 y.o. male presenting with hand pain.  Hand Pain    [name] is a 43 year-old [place] American male with HTN, systolic CHF, hyperthyroidism, and gout presents to the ED with complaints of right hand pain. Patient attributes his hand pain to a gout flare up. Patient states the pain started on Wednesday night and describes the pain as sharp. The pain is located in his right hand and wrist and it is worse at night. The pain comes and goes and Tylenol alleviates some of his pain. Patient states he is unable to flex and extend his right wrist due to pain. He rates the pain 9 out of 10. The patient was recently treated in the ED a couple weeks ago for a gout flare up. Patient does not take any medications regulate his gout. Denies: fever, chills, change in appetite, headache, chest pain, shortness of breath, cough, abdominal pain, nausea, vomiting, diarrhea.    Past Medical History  Diagnosis Date  . Chronic systolic heart failure     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%)  . Atrial fibrillation -parosysmal      Rx w amiodarone  . Noncompliance     H/O  MEDICAL NONCOMPLIANCE  . 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 Tricuspid valve regurgitation     SEVERE  . Severe mitral regurgitation   . Polymorphic ventricular tachycardia     RECURRENT WITH APPROPRIATE SHOCK THERAPY IN THE PAST  . Ventricular fibrillation     WITH APPROPRIATE SHOCK THERAPY IN THE PAST  . Hypertension   . Gout   . RA (rheumatoid arthritis)   . Automatic implantable cardiac defibrillator -BSX     single chamber  . GI bleed -massive     11 Units 2012  . Elevated LFTs   . H/O  hyperthyroidism    Past Surgical History  Procedure Laterality Date  . Cardiac catheterization  06/2006    RIGHT HEART CATH SHOWING SEVERE BIVENTRICUALR CHF WITH MARKED FILLING AND PRESSURES  . Insert / replace / remove pacemaker      GUIDANT HE ICD MODEL 2180, SERIAL # [id number]  . Cholecystectomy     Family History  Problem Relation Age of Onset  . Heart failure Brother    History  Substance Use Topics  . Smoking status: Never Smoker   . Smokeless tobacco: Never Used  . Alcohol Use: No    Review of Systems All other systems negative except as documented in the HPI. All pertinent positives and negatives as reviewed in the HPI.    Allergies  Review of patient's allergies indicates no known allergies.  Home Medications   Prior to Admission medications   Medication Sig Start Date End Date Taking? Authorizing Provider  acetaminophen (TYLENOL) 500 MG tablet Take 1,000 mg by mouth every 6 (six) hours as needed for mild pain.   Yes Historical Provider, MD  apixaban (ELIQUIS) 5 MG TABS tablet Take 1 tablet (5 mg total) by mouth 2 (two) times daily. 09/27/13  Yes Deboraha Sprang, MD  carvedilol (COREG) 25 MG tablet Take 1 tablet (25 mg total) by mouth 2 (two) times daily with a meal. 06/05/13  Yes Deboraha Sprang, MD  furosemide (LASIX) 40 MG tablet Take 40 mg by mouth daily.   Yes Historical Provider, MD  lisinopril (PRINIVIL,ZESTRIL) 40 MG tablet Take 1 tablet (40 mg total) by mouth daily. 10/13/13  Yes Deboraha Sprang, MD  spironolactone (ALDACTONE) 25 MG tablet Take 1 tablet (25 mg total) by mouth daily. 09/30/12  Yes Scott T Weaver, PA-C   BP 126/82  Pulse 76  Temp(Src) 98.7 F (37.1 C) (Oral)  Resp 20  Ht 5\' 7"  (1.702 m)  Wt 181 lb (82.101 kg)  BMI 28.34 kg/m2  SpO2 97% Physical Exam  Nursing note and vitals reviewed. Constitutional: He is oriented to person, place, and time. He appears well-developed and well-nourished. No distress.  HENT:  Head: Normocephalic and atraumatic.   Eyes: Pupils are equal, round, and reactive to light. Right eye exhibits no discharge.  Pulmonary/Chest: Effort normal.  Musculoskeletal: He exhibits tenderness.  Decrease passive ROM in R upper extremity due to pain. Unable to fully extend R elbow and unable to flex/extend R wrist due to pain.  Decreased active ROM of MCPs of R hand due pain. Full ROM in left upper extremity.   Tenderness to palpation of right wrist and MCPs of right hand. No tenderness to palpation of left wrist or MCPs.   Neurological: He is alert and oriented to person, place, and time.  Skin: Skin is warm. No rash noted. No pallor.  Slightly swollen skin over R wrist and MCPs of right hand.   Psychiatric: He has a normal mood and affect.    ED Course  Procedures (including critical care time)  Suspect patient is experiencing a gout flare up like prior ED visit. Will treat patient with a prednisone burst 50mg  x 7 days and pain medication. Inform patient to follow up with a PCP.     Brent General, PA-C 10/20/13 [phone]

## 2013-10-20 NOTE — ED Notes (Signed)
Pt complains of gout pain in his right hand since Wednesday night

## 2013-10-24 ENCOUNTER — Other Ambulatory Visit: Payer: Self-pay | Admitting: *Deleted

## 2013-10-24 DIAGNOSIS — I5022 Chronic systolic (congestive) heart failure: Secondary | ICD-10-CM

## 2013-10-24 MED ORDER — SPIRONOLACTONE 25 MG PO TABS: 25.0000 mg | ORAL_TABLET | Freq: Every day | ORAL | Status: DC

## 2013-10-25 NOTE — ED Provider Notes (Signed)
Medical screening examination/treatment/procedure(s) were performed by non-physician practitioner and as supervising physician I was immediately available for consultation/collaboration.   EKG Interpretation None       Varney Biles, MD 10/25/13 [phone]

## 2013-10-30 ENCOUNTER — Encounter (HOSPITAL_COMMUNITY): Payer: Self-pay | Admitting: Emergency Medicine

## 2013-10-30 ENCOUNTER — Emergency Department (HOSPITAL_COMMUNITY): Payer: 59

## 2013-10-30 ENCOUNTER — Emergency Department (HOSPITAL_COMMUNITY)
Admission: EM | Admit: 2013-10-30 | Discharge: 2013-10-30 | Disposition: A | Discharge status: Home / Self Care | Payer: 59 | Attending: Emergency Medicine | Admitting: Emergency Medicine

## 2013-10-30 DIAGNOSIS — I10 Essential (primary) hypertension: Secondary | ICD-10-CM | POA: Insufficient documentation

## 2013-10-30 DIAGNOSIS — M109 Gout, unspecified: Secondary | ICD-10-CM | POA: Insufficient documentation

## 2013-10-30 DIAGNOSIS — I5022 Chronic systolic (congestive) heart failure: Secondary | ICD-10-CM | POA: Insufficient documentation

## 2013-10-30 DIAGNOSIS — M10431 Other secondary gout, right wrist: Secondary | ICD-10-CM

## 2013-10-30 DIAGNOSIS — Z8739 Personal history of other diseases of the musculoskeletal system and connective tissue: Secondary | ICD-10-CM | POA: Insufficient documentation

## 2013-10-30 DIAGNOSIS — Z95810 Presence of automatic (implantable) cardiac defibrillator: Secondary | ICD-10-CM | POA: Insufficient documentation

## 2013-10-30 DIAGNOSIS — Z79899 Other long term (current) drug therapy: Secondary | ICD-10-CM | POA: Insufficient documentation

## 2013-10-30 MED ORDER — COLCHICINE 0.6 MG PO TABS: 0.6000 mg | ORAL_TABLET | Freq: Two times a day (BID) | ORAL | Status: DC

## 2013-10-30 MED ORDER — OXYCODONE-ACETAMINOPHEN 5-325 MG PO TABS
2.0000 | ORAL_TABLET | Freq: Once | ORAL | Status: AC
  Administered 2013-10-30: 2 via ORAL
  Filled 2013-10-30: qty 2

## 2013-10-30 MED ORDER — OXYCODONE-ACETAMINOPHEN 5-325 MG PO TABS: 2.0000 | ORAL_TABLET | Freq: Four times a day (QID) | ORAL | Status: DC | PRN

## 2013-10-30 MED ORDER — PREDNISONE 20 MG PO TABS: 40.0000 mg | ORAL_TABLET | Freq: Every day | ORAL | Status: DC

## 2013-10-30 NOTE — ED Notes (Signed)
Pt reports right wrist pain x 1.5 weeks. Reports similar pain last month and was dx with gout. States that medication he was given relieved pain, but states he is out of medication and pain has returned. Denies recent injury to wrist. Pulses intact.

## 2013-10-30 NOTE — ED Notes (Signed)
PT ambulated with baseline gait; VSS; A&Ox3; no signs of distress; respirations even and unlabored; skin warm and dry; no questions upon discharge.  

## 2013-10-30 NOTE — Discharge Instructions (Signed)
Gout Gout is an inflammatory arthritis caused by a buildup of uric acid crystals in the joints. Uric acid is a chemical that is normally present in the blood. When the level of uric acid in the blood is too high it can form crystals that deposit in your joints and tissues. This causes joint redness, soreness, and swelling (inflammation). Repeat attacks are common. Over time, uric acid crystals can form into masses (tophi) near a joint, destroying bone and causing disfigurement. Gout is treatable and often preventable. CAUSES  The disease begins with elevated levels of uric acid in the blood. Uric acid is produced by your body when it breaks down a naturally found substance called purines. Certain foods you eat, such as meats and fish, contain high amounts of purines. Causes of an elevated uric acid level include:  Being passed down from parent to child (heredity).  Diseases that cause increased uric acid production (such as obesity, psoriasis, and certain cancers).  Excessive alcohol use.  Diet, especially diets rich in meat and seafood.  Medicines, including certain cancer-fighting medicines (chemotherapy), water pills (diuretics), and aspirin.  Chronic kidney disease. The kidneys are no longer able to remove uric acid well.  Problems with metabolism. Conditions strongly associated with gout include:  Obesity.  High blood pressure.  High cholesterol.  Diabetes. Not everyone with elevated uric acid levels gets gout. It is not understood why some people get gout and others do not. Surgery, joint injury, and eating too much of certain foods are some of the factors that can lead to gout attacks. SYMPTOMS   An attack of gout comes on quickly. It causes intense pain with redness, swelling, and warmth in a joint.  Fever can occur.  Often, only one joint is involved. Certain joints are more commonly involved:  Base of the big toe.  Knee.  Ankle.  Wrist.  Finger. Without  treatment, an attack usually goes away in a few days to weeks. Between attacks, you usually will not have symptoms, which is different from many other forms of arthritis. DIAGNOSIS  Your caregiver will suspect gout based on your symptoms and exam. In some cases, tests may be recommended. The tests may include:  Blood tests.  Urine tests.  X-rays.  Joint fluid exam. This exam requires a needle to remove fluid from the joint (arthrocentesis). Using a microscope, gout is confirmed when uric acid crystals are seen in the joint fluid. TREATMENT  There are two phases to gout treatment: treating the sudden onset (acute) attack and preventing attacks (prophylaxis).  Treatment of an Acute Attack.  Medicines are used. These include anti-inflammatory medicines or steroid medicines.  An injection of steroid medicine into the affected joint is sometimes necessary.  The painful joint is rested. Movement can worsen the arthritis.  You may use warm or cold treatments on painful joints, depending which works best for you.  Treatment to Prevent Attacks.  If you suffer from frequent gout attacks, your caregiver may advise preventive medicine. These medicines are started after the acute attack subsides. These medicines either help your kidneys eliminate uric acid from your body or decrease your uric acid production. You may need to stay on these medicines for a very long time.  The early phase of treatment with preventive medicine can be associated with an increase in acute gout attacks. For this reason, during the first few months of treatment, your caregiver may also advise you to take medicines usually used for acute gout treatment. Be sure you  understand your caregiver's directions. Your caregiver may make several adjustments to your medicine dose before these medicines are effective.  Discuss dietary treatment with your caregiver or dietitian. Alcohol and drinks high in sugar and fructose and foods  such as meat, poultry, and seafood can increase uric acid levels. Your caregiver or dietician can advise you on drinks and foods that should be limited. HOME CARE INSTRUCTIONS   Do not take aspirin to relieve pain. This raises uric acid levels.  Only take over-the-counter or prescription medicines for pain, discomfort, or fever as directed by your caregiver.  Rest the joint as much as possible. When in bed, keep sheets and blankets off painful areas.  Keep the affected joint raised (elevated).  Apply warm or cold treatments to painful joints. Use of warm or cold treatments depends on which works best for you.  Use crutches if the painful joint is in your leg.  Drink enough fluids to keep your urine clear or pale yellow. This helps your body get rid of uric acid. Limit alcohol, sugary drinks, and fructose drinks.  Follow your dietary instructions. Pay careful attention to the amount of protein you eat. Your daily diet should emphasize fruits, vegetables, whole grains, and fat-free or low-fat milk products. Discuss the use of coffee, vitamin C, and cherries with your caregiver or dietician. These may be helpful in lowering uric acid levels.  Maintain a healthy body weight. SEEK MEDICAL CARE IF:   You develop diarrhea, vomiting, or any side effects from medicines.  You do not feel better in 24 hours, or you are getting worse. SEEK IMMEDIATE MEDICAL CARE IF:   Your joint becomes suddenly more tender, and you have chills or a fever. MAKE SURE YOU:   Understand these instructions.  Will watch your condition.  Will get help right away if you are not doing well or get worse. Document Released: 04/03/2000 Document Revised: 08/01/2012 Document Reviewed: 11/18/2011 [facility] Patient Information 2015 [place], [place]. This information is not intended to replace advice given to you by your health care provider. Make sure you discuss any questions you have with your health care  provider.  Low-Purine Diet Purines are compounds that affect the level of uric acid in your body. A low-purine diet is a diet that is low in purines. Eating a low-purine diet can prevent the level of uric acid in your body from getting too high and causing gout or kidney stones or both. WHAT DO I NEED TO KNOW ABOUT THIS DIET?  Choose low-purine foods. Examples of low-purine foods are listed in the next section.  Drink plenty of fluids, especially water. Fluids can help remove uric acid from your body. Try to drink 8-16 cups (1.9-3.8 L) a day.  Limit foods high in fat, especially saturated fat, as fat makes it harder for the body to get rid of uric acid. Foods high in saturated fat include pizza, cheese, ice cream, whole milk, fried foods, and gravies. Choose foods that are lower in fat and lean sources of protein. Use olive oil when cooking as it contains healthy fats that are not high in saturated fat.  Limit alcohol. Alcohol interferes with the elimination of uric acid from your body. If you are having a gout attack, avoid all alcohol.  Keep in mind that different people's bodies react differently to different foods. You will probably learn over time which foods do or do not affect you. If you discover that a food tends to cause your gout to flare  up, avoid eating that food. You can more freely enjoy foods that do not cause problems. If you have any questions about a food item, talk to your dietitian or health care provider. WHICH FOODS ARE LOW, MODERATE, AND HIGH IN PURINES? The following is a list of foods that are low, moderate, and high in purines. You can eat any amount of the foods that are low in purines. You may be able to have small amounts of foods that are moderate in purines. Ask your health care provider how much of a food moderate in purines you can have. Avoid foods high in purines. Grains  Foods low in purines: Enriched white bread, pasta, rice, cake, cornbread, popcorn.  Foods  moderate in purines: Whole-grain breads and cereals, wheat germ, bran, oatmeal. Uncooked oatmeal. Dry wheat bran or wheat germ.  Foods high in purines: Pancakes, [place] toast, biscuits, muffins. Vegetables  Foods low in purines: All vegetables, except those that are moderate in purines.  Foods moderate in purines: Asparagus, cauliflower, spinach, mushrooms, green peas. Fruits  All fruits are low in purines. Meats and other Protein Foods  Foods low in purines: Eggs, nuts, peanut butter.  Foods moderate in purines: 80-90% lean beef, lamb, veal, pork, poultry, fish, eggs, peanut butter, nuts. Crab, lobster, oysters, and shrimp. Cooked dried beans, peas, and lentils.  Foods high in purines: Anchovies, sardines, herring, mussels, tuna, codfish, scallops, trout, and haddock. Berniece Salines. Organ meats (such as liver or kidney). Tripe. Game meat. Goose. Sweetbreads. Dairy  All dairy foods are low in purines. Low-fat and fat-free dairy products are best because they are low in saturated fat. Beverages  Drinks low in purines: Water, carbonated beverages, tea, coffee, cocoa.  Drinks moderate in purines: Soft drinks and other drinks sweetened with high-fructose corn syrup. Juices. To find whether a food or drink is sweetened with high-fructose corn syrup, look at the ingredients list.  Drinks high in purines: Alcoholic beverages (such as beer). Condiments  Foods low in purines: Salt, herbs, olives, pickles, relishes, vinegar.  Foods moderate in purines: Butter, margarine, oils, mayonnaise. Fats and Oils  Foods low in purines: All types, except gravies and sauces made with meat.  Foods high in purines: Gravies and sauces made with meat. Other Foods  Foods low in purines: Sugars, sweets, gelatin. Cake. Soups made without meat.  Foods moderate in purines: Meat-based or fish-based soups, broths, or bouillons. Foods and drinks sweetened with high-fructose corn syrup.  Foods high in purines:  High-fat desserts (such as ice cream, cookies, cakes, pies, doughnuts, and chocolate). Contact your dietitian for more information on foods that are not listed here. Document Released: 08/01/2010 Document Revised: 04/11/2013 Document Reviewed: 03/13/2013 [facility] Patient Information 2015 [place], [place]. This information is not intended to replace advice given to you by your health care provider. Make sure you discuss any questions you have with your health care provider.

## 2013-10-30 NOTE — ED Provider Notes (Signed)
CSN: [id number]     Arrival date & time 10/30/13  1730 History  This chart was scribed for non-physician practitioner, Montine Circle, PA-C working with Varney Biles, MD by Frederich Balding, ED scribe. This patient was seen in room TR05C/TR05C and the patient's care was started at 6:54 PM.   Chief Complaint  Patient presents with  . Gout   The history is provided by the patient. No language interpreter was used.   HPI Comments: [name] is a 43 y.o. male who presents to the Emergency Department complaining of gradual onset right wrist and hand pain with associated mild swelling that started 1.5 weeks ago. States he was evaluated for similar pain last month and diagnosed with gout. Pt was given percocet, colchicine and prednisone and states it provided relief but is now out of the medications. Movement worsens the pain. Denies new sexual contacts. Denies fever, penile discharge, abrasions or other wounds. Denies history of diabetes.   Past Medical History  Diagnosis Date  . Chronic systolic heart failure     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%)  . Atrial fibrillation -parosysmal      Rx w amiodarone  . Noncompliance     H/O  MEDICAL NONCOMPLIANCE  . 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 Tricuspid valve regurgitation     SEVERE  . Severe mitral regurgitation   . Polymorphic ventricular tachycardia     RECURRENT WITH APPROPRIATE SHOCK THERAPY IN THE PAST  . Ventricular fibrillation     WITH APPROPRIATE SHOCK THERAPY IN THE PAST  . Hypertension   . Gout   . RA (rheumatoid arthritis)   . Automatic implantable cardiac defibrillator -BSX     single chamber  . GI bleed -massive     11 Units 2012  . Elevated LFTs   . H/O hyperthyroidism    Past Surgical History  Procedure Laterality Date  . Cardiac catheterization  06/2006    RIGHT HEART CATH SHOWING SEVERE BIVENTRICUALR CHF WITH MARKED FILLING AND PRESSURES  . Insert /  replace / remove pacemaker      GUIDANT HE ICD MODEL 2180, SERIAL # [id number]  . Cholecystectomy     Family History  Problem Relation Age of Onset  . Heart failure Brother    History  Substance Use Topics  . Smoking status: Never Smoker   . Smokeless tobacco: Never Used  . Alcohol Use: No    Review of Systems  Constitutional: Negative for fever.  HENT: Negative for congestion.   Eyes: Negative for redness.  Respiratory: Negative for shortness of breath.   Cardiovascular: Negative for chest pain.  Gastrointestinal: Negative for abdominal distention.  Genitourinary: Negative for discharge.  Musculoskeletal: Positive for arthralgias and joint swelling.  Skin: Negative for wound.  Neurological: Negative for speech difficulty.  Psychiatric/Behavioral: Negative for confusion.   Allergies  Review of patient's allergies indicates no known allergies.  Home Medications   Prior to Admission medications   Medication Sig Start Date End Date Taking? Authorizing Provider  acetaminophen (TYLENOL) 500 MG tablet Take 1,000 mg by mouth every 6 (six) hours as needed for mild pain.   Yes Historical Provider, MD  apixaban (ELIQUIS) 5 MG TABS tablet Take 1 tablet (5 mg total) by mouth 2 (two) times daily. 09/27/13  Yes Deboraha Sprang, MD  carvedilol (COREG) 25 MG tablet Take 1 tablet (25 mg total) by mouth 2 (two) times daily with a meal. 06/05/13  Yes Deboraha Sprang, MD  furosemide (LASIX) 40 MG tablet Take 40 mg by mouth daily.   Yes Historical Provider, MD  lisinopril (PRINIVIL,ZESTRIL) 40 MG tablet Take 1 tablet (40 mg total) by mouth daily. 10/13/13  Yes Deboraha Sprang, MD  spironolactone (ALDACTONE) 25 MG tablet Take 1 tablet (25 mg total) by mouth daily. 10/24/13  Yes Deboraha Sprang, MD   BP 113/87  Pulse 71  Temp(Src) 98.2 F (36.8 C) (Oral)  Resp 12  Wt 184 lb 7 oz (83.66 kg)  SpO2 100%  Physical Exam  Nursing note and vitals reviewed. Constitutional: He is oriented to person, place,  and time. He appears well-developed. No distress.  HENT:  Head: Normocephalic and atraumatic.  Eyes: Conjunctivae and EOM are normal.  Cardiovascular: Normal rate, regular rhythm and intact distal pulses.   Brisk capillary refill.   Pulmonary/Chest: Effort normal. No stridor. No respiratory distress.  Abdominal: He exhibits no distension.  Musculoskeletal: He exhibits no edema.  Right hand and wrist mildly swollen. No evidence of erythema. ROM and strength limited secondary to pain. No bony abnormality or deformity.   Neurological: He is alert and oriented to person, place, and time.  Sensation intact.  Skin: Skin is warm and dry.  Psychiatric: He has a normal mood and affect.    ED Course  Procedures (including critical care time)  DIAGNOSTIC STUDIES: Oxygen Saturation is 98% on RA, normal by my interpretation.    COORDINATION OF CARE: 6:56 PM-Discussed treatment plan which includes xray with pt at bedside and pt agreed to plan.   Labs Review Labs Reviewed - No data to display  Imaging Review Dg Wrist Complete Right  10/30/2013   CLINICAL DATA:  Right wrist pain  EXAM: RIGHT WRIST - COMPLETE 3+ VIEW  COMPARISON:  None  FINDINGS: No fracture or dislocation. Normal alignment noted Mild erosive changes involving the knee ulnar aspect of the knee triquetrum, dorsal lunate, fifth CMC joint and scaphotrapezoid joint consistent with the patient's history of gout. The soft tissues are unremarkable.  IMPRESSION: No acute osseous injury of the knee right wrist. There are a few carpal erosions consistent with the patient's history of gout.   Electronically Signed   By: Kathreen Devoid   On: 10/30/2013 20:52     EKG Interpretation None      MDM   Final diagnoses:  Other secondary gout of right wrist    Pt presents with monoarticular pain, swelling and erythema.  Pt is afebrile and stable. Imaging reviewed, no evidence of occult fracture or injury. Percocet, prednisone, and colchicine.  Discussed that pt should respond to treatment with in 24 hour of begining treatment & likely resolve in 2-3 days.    I personally performed the services described in this documentation, which was scribed in my presence. The recorded information has been reviewed and is accurate.  Montine Circle, PA-C 10/30/13 2058

## 2013-10-30 NOTE — ED Notes (Signed)
Xray called regarding delay.

## 2013-11-01 ENCOUNTER — Encounter: Payer: Self-pay | Admitting: Internal Medicine

## 2013-11-01 NOTE — ED Provider Notes (Signed)
Medical screening examination/treatment/procedure(s) were performed by non-physician practitioner and as supervising physician I was immediately available for consultation/collaboration.   EKG Interpretation None       Varney Biles, MD 11/01/13 1211

## 2013-12-08 ENCOUNTER — Emergency Department (HOSPITAL_COMMUNITY)
Admission: EM | Admit: 2013-12-08 | Discharge: 2013-12-08 | Disposition: A | Discharge status: Home / Self Care | Payer: 59 | Attending: Emergency Medicine | Admitting: Emergency Medicine

## 2013-12-08 ENCOUNTER — Encounter (HOSPITAL_COMMUNITY): Payer: Self-pay | Admitting: Emergency Medicine

## 2013-12-08 DIAGNOSIS — I4891 Unspecified atrial fibrillation: Secondary | ICD-10-CM | POA: Insufficient documentation

## 2013-12-08 DIAGNOSIS — M069 Rheumatoid arthritis, unspecified: Secondary | ICD-10-CM | POA: Insufficient documentation

## 2013-12-08 DIAGNOSIS — I4729 Other ventricular tachycardia: Secondary | ICD-10-CM | POA: Diagnosis not present

## 2013-12-08 DIAGNOSIS — M1990 Unspecified osteoarthritis, unspecified site: Secondary | ICD-10-CM

## 2013-12-08 DIAGNOSIS — Z9889 Other specified postprocedural states: Secondary | ICD-10-CM | POA: Diagnosis not present

## 2013-12-08 DIAGNOSIS — Z8674 Personal history of sudden cardiac arrest: Secondary | ICD-10-CM | POA: Insufficient documentation

## 2013-12-08 DIAGNOSIS — Z950 Presence of cardiac pacemaker: Secondary | ICD-10-CM | POA: Diagnosis not present

## 2013-12-08 DIAGNOSIS — Z79899 Other long term (current) drug therapy: Secondary | ICD-10-CM | POA: Insufficient documentation

## 2013-12-08 DIAGNOSIS — M109 Gout, unspecified: Secondary | ICD-10-CM | POA: Diagnosis present

## 2013-12-08 DIAGNOSIS — I472 Ventricular tachycardia, unspecified: Secondary | ICD-10-CM | POA: Insufficient documentation

## 2013-12-08 DIAGNOSIS — Z8719 Personal history of other diseases of the digestive system: Secondary | ICD-10-CM | POA: Insufficient documentation

## 2013-12-08 DIAGNOSIS — Z7902 Long term (current) use of antithrombotics/antiplatelets: Secondary | ICD-10-CM | POA: Diagnosis not present

## 2013-12-08 DIAGNOSIS — I4901 Ventricular fibrillation: Secondary | ICD-10-CM | POA: Diagnosis not present

## 2013-12-08 DIAGNOSIS — I10 Essential (primary) hypertension: Secondary | ICD-10-CM | POA: Diagnosis not present

## 2013-12-08 DIAGNOSIS — I5022 Chronic systolic (congestive) heart failure: Secondary | ICD-10-CM | POA: Diagnosis not present

## 2013-12-08 DIAGNOSIS — Z95810 Presence of automatic (implantable) cardiac defibrillator: Secondary | ICD-10-CM | POA: Insufficient documentation

## 2013-12-08 MED ORDER — COLCHICINE 0.6 MG PO TABS: 0.6000 mg | ORAL_TABLET | Freq: Every day | ORAL | Status: DC

## 2013-12-08 MED ORDER — PREDNISONE 10 MG PO TABS: 20.0000 mg | ORAL_TABLET | Freq: Every day | ORAL | Status: DC

## 2013-12-08 MED ORDER — PREDNISONE 20 MG PO TABS
60.0000 mg | ORAL_TABLET | Freq: Once | ORAL | Status: AC
  Administered 2013-12-08: 60 mg via ORAL
  Filled 2013-12-08: qty 3

## 2013-12-08 MED ORDER — OXYCODONE-ACETAMINOPHEN 5-325 MG PO TABS: 1.0000 | ORAL_TABLET | ORAL | Status: DC | PRN

## 2013-12-08 MED ORDER — OXYCODONE-ACETAMINOPHEN 5-325 MG PO TABS
1.0000 | ORAL_TABLET | Freq: Once | ORAL | Status: AC
  Administered 2013-12-08: 1 via ORAL
  Filled 2013-12-08: qty 1

## 2013-12-08 NOTE — Discharge Instructions (Signed)
Arthritis, Nonspecific °Arthritis is inflammation of a joint. This usually means pain, redness, warmth or swelling are present. One or more joints may be involved. There are a number of types of arthritis. Your caregiver may not be able to tell what type of arthritis you have right away. °CAUSES  °The most common cause of arthritis is the wear and tear on the joint (osteoarthritis). This causes damage to the cartilage, which can break down over time. The knees, hips, back and neck are most often affected by this type of arthritis. °Other types of arthritis and common causes of joint pain include: °· Sprains and other injuries near the joint. Sometimes minor sprains and injuries cause pain and swelling that develop hours later. °· Rheumatoid arthritis. This affects hands, feet and knees. It usually affects both sides of your body at the same time. It is often associated with chronic ailments, fever, weight loss and general weakness. °· Crystal arthritis. Gout and pseudo gout can cause occasional acute severe pain, redness and swelling in the foot, ankle, or knee. °· Infectious arthritis. Bacteria can get into a joint through a break in overlying skin. This can cause infection of the joint. Bacteria and viruses can also spread through the blood and affect your joints. °· Drug, infectious and allergy reactions. Sometimes joints can become mildly painful and slightly swollen with these types of illnesses. °SYMPTOMS  °· Pain is the main symptom. °· Your joint or joints can also be red, swollen and warm or hot to the touch. °· You may have a fever with certain types of arthritis, or even feel overall ill. °· The joint with arthritis will hurt with movement. Stiffness is present with some types of arthritis. °DIAGNOSIS  °Your caregiver will suspect arthritis based on your description of your symptoms and on your exam. Testing may be needed to find the type of arthritis: °· Blood and sometimes urine tests. °· X-ray tests  and sometimes CT or MRI scans. °· Removal of fluid from the joint (arthrocentesis) is done to check for bacteria, crystals or other causes. Your caregiver (or a specialist) will numb the area over the joint with a local anesthetic, and use a needle to remove joint fluid for examination. This procedure is only minimally uncomfortable. °· Even with these tests, your caregiver may not be able to tell what kind of arthritis you have. Consultation with a specialist (rheumatologist) may be helpful. °TREATMENT  °Your caregiver will discuss with you treatment specific to your type of arthritis. If the specific type cannot be determined, then the following general recommendations may apply. °Treatment of severe joint pain includes: °· Rest. °· Elevation. °· Anti-inflammatory medication (for example, ibuprofen) may be prescribed. Avoiding activities that cause increased pain. °· Only take over-the-counter or prescription medicines for pain and discomfort as recommended by your caregiver. °· Cold packs over an inflamed joint may be used for 10 to 15 minutes every hour. Hot packs sometimes feel better, but do not use overnight. Do not use hot packs if you are diabetic without your caregiver's permission. °· A cortisone shot into arthritic joints may help reduce pain and swelling. °· Any acute arthritis that gets worse over the next 1 to 2 days needs to be looked at to be sure there is no joint infection. °Long-term arthritis treatment involves modifying activities and lifestyle to reduce joint stress jarring. This can include weight loss. Also, exercise is needed to nourish the joint cartilage and remove waste. This helps keep the muscles   around the joint strong. °HOME CARE INSTRUCTIONS  °· Do not take aspirin to relieve pain if gout is suspected. This elevates uric acid levels. °· Only take over-the-counter or prescription medicines for pain, discomfort or fever as directed by your caregiver. °· Rest the joint as much as  possible. °· If your joint is swollen, keep it elevated. °· Use crutches if the painful joint is in your leg. °· Drinking plenty of fluids may help for certain types of arthritis. °· Follow your caregiver's dietary instructions. °· Try low-impact exercise such as: °¨ Swimming. °¨ Water aerobics. °¨ Biking. °¨ Walking. °· Morning stiffness is often relieved by a warm shower. °· Put your joints through regular range-of-motion. °SEEK MEDICAL CARE IF:  °· You do not feel better in 24 hours or are getting worse. °· You have side effects to medications, or are not getting better with treatment. °SEEK IMMEDIATE MEDICAL CARE IF:  °· You have a fever. °· You develop severe joint pain, swelling or redness. °· Many joints are involved and become painful and swollen. °· There is severe back pain and/or leg weakness. °· You have loss of bowel or bladder control. °Document Released: 05/14/2004 Document Revised: 06/29/2011 Document Reviewed: 05/30/2008 °[employer]® Patient Information ©2015 [employer]. This information is not intended to replace advice given to you by your health care provider. Make sure you discuss any questions you have with your health care provider. ° °

## 2013-12-08 NOTE — ED Provider Notes (Signed)
CSN: [id number]     Arrival date & time 12/08/13  2119 History   First MD Initiated Contact with Patient 12/08/13 2210     Chief Complaint  Patient presents with  . Gout     (Consider location/radiation/quality/duration/timing/severity/associated sxs/prior Treatment) HPI Comments: Patient presents with bilateral joint pain. He has had a history of recurrent pain in his hands. He says is usually in his wrist it alternates between his left and his right hand. Occasionally he'll have swelling in his entire hands. He denies any other joint problems. He believes that this is a gout flareup. Although he has in the past seen a rheumatologist and he thinks that he might of been diagnosed with rheumatoid arthritis. He was previously on an unknown daily medication to treat his arthritis. Recently he's been treated for these presumed gout flareups with colchicine prednisone and Percocet. He states that this combination has worked very well in the past and he requests this medication again. He states over the last few days he's had some pain and swelling in both of his hands. He says is the same type of symptoms he's had before with his arthritis flareups. He denies any fevers vomiting or other recent illnesses.   Past Medical History  Diagnosis Date  . Chronic systolic heart failure     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%)  . Atrial fibrillation -parosysmal      Rx w amiodarone  . Noncompliance     H/O  MEDICAL NONCOMPLIANCE  . 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 Tricuspid valve regurgitation     SEVERE  . Severe mitral regurgitation   . Polymorphic ventricular tachycardia     RECURRENT WITH APPROPRIATE SHOCK THERAPY IN THE PAST  . Ventricular fibrillation     WITH APPROPRIATE SHOCK THERAPY IN THE PAST  . Hypertension   . Gout   . RA (rheumatoid arthritis)   . Automatic implantable cardiac defibrillator -BSX     single chamber  . GI  bleed -massive     11 Units 2012  . Elevated LFTs   . H/O hyperthyroidism    Past Surgical History  Procedure Laterality Date  . Cardiac catheterization  06/2006    RIGHT HEART CATH SHOWING SEVERE BIVENTRICUALR CHF WITH MARKED FILLING AND PRESSURES  . Insert / replace / remove pacemaker      GUIDANT HE ICD MODEL 2180, SERIAL # [id number]  . Cholecystectomy     Family History  Problem Relation Age of Onset  . Heart failure Brother    History  Substance Use Topics  . Smoking status: Never Smoker   . Smokeless tobacco: Never Used  . Alcohol Use: No    Review of Systems  Constitutional: Negative for fever.  Gastrointestinal: Negative for nausea and vomiting.  Musculoskeletal: Positive for arthralgias and joint swelling. Negative for back pain and neck pain.  Skin: Negative for wound.  Neurological: Negative for weakness, numbness and headaches.      Allergies  Review of patient's allergies indicates no known allergies.  Home Medications   Prior to Admission medications   Medication Sig Start Date End Date Taking? Authorizing Provider  acetaminophen (TYLENOL) 500 MG tablet Take 1,000 mg by mouth every 6 (six) hours as needed for mild pain.   Yes Historical Provider, MD  apixaban (ELIQUIS) 5 MG TABS tablet Take 1 tablet (5 mg total) by mouth 2 (two) times daily. 09/27/13  Yes Deboraha Sprang,  MD  carvedilol (COREG) 25 MG tablet Take 1 tablet (25 mg total) by mouth 2 (two) times daily with a meal. 06/05/13  Yes Deboraha Sprang, MD  colchicine 0.6 MG tablet Take 0.6 mg by mouth daily as needed. 10/30/13  Yes Montine Circle, PA-C  furosemide (LASIX) 40 MG tablet Take 40 mg by mouth daily.   Yes Historical Provider, MD  lisinopril (PRINIVIL,ZESTRIL) 40 MG tablet Take 1 tablet (40 mg total) by mouth daily. 10/13/13  Yes Deboraha Sprang, MD  oxyCODONE-acetaminophen (PERCOCET/ROXICET) 5-325 MG per tablet Take 2 tablets by mouth every 6 (six) hours as needed for moderate pain or severe pain.  10/30/13  Yes Montine Circle, PA-C  spironolactone (ALDACTONE) 25 MG tablet Take 1 tablet (25 mg total) by mouth daily. 10/24/13  Yes Deboraha Sprang, MD  colchicine 0.6 MG tablet Take 1 tablet (0.6 mg total) by mouth daily. 12/08/13   Malvin Johns, MD  oxyCODONE-acetaminophen (PERCOCET) 5-325 MG per tablet Take 1-2 tablets by mouth every 4 (four) hours as needed. 12/08/13   Malvin Johns, MD  predniSONE (DELTASONE) 10 MG tablet Take 2 tablets (20 mg total) by mouth daily. 12/08/13   Malvin Johns, MD   BP 119/76  Pulse 60  Temp(Src) 99.4 F (37.4 C) (Oral)  Resp 20  Wt 184 lb 7 oz (83.66 kg)  SpO2 98% Physical Exam  Constitutional: He is oriented to person, place, and time. He appears well-developed and well-nourished.  HENT:  Head: Normocephalic and atraumatic.  Neck: Normal range of motion. Neck supple.  Cardiovascular: Normal rate.   Pulmonary/Chest: Effort normal.  Musculoskeletal: He exhibits edema and tenderness.  Patient has some mild swelling of both hands. He's tender along the joints in his wrist and is hands. There is no warmth or erythema. There is no rashes or wounds. There is no other joint swelling.  Neurological: He is alert and oriented to person, place, and time.  Skin: Skin is warm and dry.  Psychiatric: He has a normal mood and affect.    ED Course  Procedures (including critical care time) Labs Review Labs Reviewed - No data to display  Imaging Review No results found.   EKG Interpretation None      MDM   Final diagnoses:  Arthritis    Patient presents with arthritis symptoms of his hands. I feel this would more likely represent an arthritis such as rheumatoid arthritis rather than gout as is his in multiple joints of his hands. However he did seem to respond well to the previous combination and I did prescribe this again. I did encourage him to followup with his rheumatologist who is in [place] and he agrees to do this.    Malvin Johns,  MD 12/08/13 [phone]

## 2013-12-08 NOTE — ED Notes (Signed)
Presents with bilateral hand pain and swelling began last night-joints swollen and warm, HX of gout-feels the same.

## 2013-12-22 ENCOUNTER — Encounter (HOSPITAL_COMMUNITY): Payer: Self-pay | Admitting: Emergency Medicine

## 2013-12-22 ENCOUNTER — Emergency Department (HOSPITAL_COMMUNITY)
Admission: EM | Admit: 2013-12-22 | Discharge: 2013-12-22 | Disposition: A | Discharge status: Home / Self Care | Payer: 59 | Attending: Emergency Medicine | Admitting: Emergency Medicine

## 2013-12-22 DIAGNOSIS — Z8674 Personal history of sudden cardiac arrest: Secondary | ICD-10-CM | POA: Insufficient documentation

## 2013-12-22 DIAGNOSIS — Z8719 Personal history of other diseases of the digestive system: Secondary | ICD-10-CM | POA: Insufficient documentation

## 2013-12-22 DIAGNOSIS — Z9889 Other specified postprocedural states: Secondary | ICD-10-CM | POA: Diagnosis not present

## 2013-12-22 DIAGNOSIS — Z950 Presence of cardiac pacemaker: Secondary | ICD-10-CM | POA: Insufficient documentation

## 2013-12-22 DIAGNOSIS — Z7902 Long term (current) use of antithrombotics/antiplatelets: Secondary | ICD-10-CM | POA: Diagnosis not present

## 2013-12-22 DIAGNOSIS — Z79899 Other long term (current) drug therapy: Secondary | ICD-10-CM | POA: Diagnosis not present

## 2013-12-22 DIAGNOSIS — I4891 Unspecified atrial fibrillation: Secondary | ICD-10-CM | POA: Insufficient documentation

## 2013-12-22 DIAGNOSIS — Z95810 Presence of automatic (implantable) cardiac defibrillator: Secondary | ICD-10-CM | POA: Diagnosis not present

## 2013-12-22 DIAGNOSIS — I10 Essential (primary) hypertension: Secondary | ICD-10-CM | POA: Insufficient documentation

## 2013-12-22 DIAGNOSIS — M069 Rheumatoid arthritis, unspecified: Secondary | ICD-10-CM | POA: Insufficient documentation

## 2013-12-22 DIAGNOSIS — M109 Gout, unspecified: Secondary | ICD-10-CM | POA: Diagnosis not present

## 2013-12-22 DIAGNOSIS — I5022 Chronic systolic (congestive) heart failure: Secondary | ICD-10-CM | POA: Insufficient documentation

## 2013-12-22 DIAGNOSIS — M1009 Idiopathic gout, multiple sites: Secondary | ICD-10-CM

## 2013-12-22 LAB — CBC WITH DIFFERENTIAL/PLATELET
BASOS PCT: 0 % (ref 0–1)
Basophils Absolute: 0 10*3/uL (ref 0.0–0.1)
Eosinophils Absolute: 0 10*3/uL (ref 0.0–0.7)
Eosinophils Relative: 0 % (ref 0–5)
HEMATOCRIT: 42.2 % (ref 39.0–52.0)
HEMOGLOBIN: 13.5 g/dL (ref 13.0–17.0)
LYMPHS ABS: 1.7 10*3/uL (ref 0.7–4.0)
Lymphocytes Relative: 15 % (ref 12–46)
MCH: 21.7 pg — AB (ref 26.0–34.0)
MCHC: 32 g/dL (ref 30.0–36.0)
MCV: 67.8 fL — AB (ref 78.0–100.0)
MONO ABS: 1 10*3/uL (ref 0.1–1.0)
Monocytes Relative: 9 % (ref 3–12)
Neutro Abs: 8.9 10*3/uL — ABNORMAL HIGH (ref 1.7–7.7)
Neutrophils Relative %: 76 % (ref 43–77)
Platelets: 198 10*3/uL (ref 150–400)
RBC: 6.22 MIL/uL — ABNORMAL HIGH (ref 4.22–5.81)
RDW: 18.1 % — ABNORMAL HIGH (ref 11.5–15.5)
WBC: 11.6 10*3/uL — AB (ref 4.0–10.5)

## 2013-12-22 LAB — I-STAT CHEM 8, ED
BUN: 25 mg/dL — AB (ref 6–23)
CALCIUM ION: 1.15 mmol/L (ref 1.12–1.23)
Chloride: 104 mEq/L (ref 96–112)
Creatinine, Ser: 1.7 mg/dL — ABNORMAL HIGH (ref 0.50–1.35)
Glucose, Bld: 99 mg/dL (ref 70–99)
HCT: 49 % (ref 39.0–52.0)
Hemoglobin: 16.7 g/dL (ref 13.0–17.0)
Potassium: 4.6 mEq/L (ref 3.7–5.3)
Sodium: 136 mEq/L — ABNORMAL LOW (ref 137–147)
TCO2: 23 mmol/L (ref 0–100)

## 2013-12-22 LAB — I-STAT CG4 LACTIC ACID, ED: Lactic Acid, Venous: 1.89 mmol/L (ref 0.5–2.2)

## 2013-12-22 MED ORDER — PREDNISONE 20 MG PO TABS: 40.0000 mg | ORAL_TABLET | Freq: Every day | ORAL | Status: DC

## 2013-12-22 MED ORDER — OXYCODONE-ACETAMINOPHEN 5-325 MG PO TABS: 1.0000 | ORAL_TABLET | Freq: Three times a day (TID) | ORAL | Status: DC | PRN

## 2013-12-22 MED ORDER — OXYCODONE-ACETAMINOPHEN 5-325 MG PO TABS
2.0000 | ORAL_TABLET | Freq: Once | ORAL | Status: AC
  Administered 2013-12-22: 2 via ORAL
  Filled 2013-12-22: qty 2

## 2013-12-22 MED ORDER — SODIUM CHLORIDE 0.9 % IV BOLUS (SEPSIS)
1000.0000 mL | Freq: Once | INTRAVENOUS | Status: AC
  Administered 2013-12-22: 1000 mL via INTRAVENOUS

## 2013-12-22 MED ORDER — COLCHICINE 0.6 MG PO TABS: 0.6000 mg | ORAL_TABLET | Freq: Two times a day (BID) | ORAL | Status: DC

## 2013-12-22 NOTE — ED Notes (Signed)
Pt waiting on ride at this time. Given 2 prescrips.

## 2013-12-22 NOTE — ED Notes (Signed)
Pt came in by EMS. EMS reports pt c/o of gout pain  And vitals WNL.  Pt reports left shoulder, elbow hand and foot pain that pt believes is gout. All sites reported at a 10/10. Pt says he is having a difficult time ambulating at home but is able to get up to the bathroom.

## 2013-12-22 NOTE — ED Notes (Signed)
Pt made aware to return if chills or fever presented.  Medications and precautions were discussed in regards to not driving/drinking alcohol with percocet.

## 2013-12-22 NOTE — ED Notes (Signed)
Bed: WTR6 Expected date:  Expected time:  Means of arrival:  Comments: EMS-anxiety

## 2013-12-22 NOTE — ED Notes (Signed)
2 Bp taken 86/49 and 79/52.

## 2013-12-22 NOTE — ED Notes (Signed)
Pt reports gout pain x1.5 days ago in left shoulder, left arm, left hand, left foot, left ankle.  Pt reports throbbing and aching.

## 2013-12-22 NOTE — Discharge Instructions (Signed)

## 2013-12-22 NOTE — ED Provider Notes (Signed)
Medical screening examination/treatment/procedure(s) were conducted as a shared visit with non-physician practitioner(s) and myself.  I personally evaluated the patient during the encounter.  Pt had an episode of transient hypotension.  He improved while in the emergency department.  The patient was not having any trouble chest pain, nausea vomiting or diarrhea.  No signs of infection or sepsis.  At this time there does not appear to be any evidence of an acute emergency medical condition and the patient appears stable for discharge with appropriate outpatient follow up.   Dorie Rank, MD 12/22/13 [phone]

## 2013-12-22 NOTE — ED Provider Notes (Signed)
CSN: [id number]     Arrival date & time 12/22/13  1059 History   First MD Initiated Contact with Patient 12/22/13 1108     Chief Complaint  Patient presents with  . Gout     (Consider location/radiation/quality/duration/timing/severity/associated sxs/prior Treatment) HPI Comments: Patient presents emergency department with chief complaint of gout. He states that he has recurrent gout attacks. His last attack was approximately 2 weeks ago. He states that he normally takes prednisone and colchicine with good relief. He states that his current symptoms are in his left shoulder left elbow left hand, and left great toe. He was seen recently, and it was strongly encouraged that he followup with a rheumatologist. He denies fevers, chills, nausea, vomiting, diarrhea, constipation. Denies any chest pain or shortness of breath. The symptoms are aggravated with palpation. Rest makes his symptoms better.  The history is provided by the patient. No language interpreter was used.    Past Medical History  Diagnosis Date  . Chronic systolic heart failure     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%)  . Atrial fibrillation -parosysmal      Rx w amiodarone  . Noncompliance     H/O  MEDICAL NONCOMPLIANCE  . 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 Tricuspid valve regurgitation     SEVERE  . Severe mitral regurgitation   . Polymorphic ventricular tachycardia     RECURRENT WITH APPROPRIATE SHOCK THERAPY IN THE PAST  . Ventricular fibrillation     WITH APPROPRIATE SHOCK THERAPY IN THE PAST  . Hypertension   . Gout   . RA (rheumatoid arthritis)   . Automatic implantable cardiac defibrillator -BSX     single chamber  . GI bleed -massive     11 Units 2012  . Elevated LFTs   . H/O hyperthyroidism    Past Surgical History  Procedure Laterality Date  . Cardiac catheterization  06/2006    RIGHT HEART CATH SHOWING SEVERE BIVENTRICUALR CHF WITH MARKED  FILLING AND PRESSURES  . Insert / replace / remove pacemaker      GUIDANT HE ICD MODEL 2180, SERIAL # [id number]  . Cholecystectomy     Family History  Problem Relation Age of Onset  . Heart failure Brother    History  Substance Use Topics  . Smoking status: Never Smoker   . Smokeless tobacco: Never Used  . Alcohol Use: No    Review of Systems  Constitutional: Negative for fever and chills.  Respiratory: Negative for shortness of breath.   Cardiovascular: Negative for chest pain.  Gastrointestinal: Negative for nausea, vomiting, diarrhea and constipation.  Genitourinary: Negative for dysuria.  Musculoskeletal: Positive for arthralgias.  All other systems reviewed and are negative.     Allergies  Review of patient's allergies indicates no known allergies.  Home Medications   Prior to Admission medications   Medication Sig Start Date End Date Taking? Authorizing Provider  apixaban (ELIQUIS) 5 MG TABS tablet Take 1 tablet (5 mg total) by mouth 2 (two) times daily. 09/27/13  Yes Deboraha Sprang, MD  carvedilol (COREG) 25 MG tablet Take 1 tablet (25 mg total) by mouth 2 (two) times daily with a meal. 06/05/13  Yes Deboraha Sprang, MD  colchicine 0.6 MG tablet Take 0.6 mg by mouth daily as needed. 10/30/13  Yes Montine Circle, PA-C  furosemide (LASIX) 40 MG tablet Take 40 mg by mouth daily.   Yes Historical Provider, MD  lisinopril (PRINIVIL,ZESTRIL)  40 MG tablet Take 1 tablet (40 mg total) by mouth daily. 10/13/13  Yes Deboraha Sprang, MD  oxyCODONE-acetaminophen (PERCOCET/ROXICET) 5-325 MG per tablet Take 2 tablets by mouth every 6 (six) hours as needed for moderate pain or severe pain. 10/30/13  Yes Montine Circle, PA-C  spironolactone (ALDACTONE) 25 MG tablet Take 1 tablet (25 mg total) by mouth daily. 10/24/13  Yes Deboraha Sprang, MD   BP 103/60  Pulse 98  Temp(Src) 99.5 F (37.5 C)  Resp 16  Wt 185 lb (83.915 kg)  SpO2 98% Physical Exam  Nursing note and vitals  reviewed. Constitutional: He is oriented to person, place, and time. He appears well-developed and well-nourished.  HENT:  Head: Normocephalic and atraumatic.  Eyes: Conjunctivae and EOM are normal. Pupils are equal, round, and reactive to light. Right eye exhibits no discharge. Left eye exhibits no discharge. No scleral icterus.  Neck: Normal range of motion. Neck supple. No JVD present.  Cardiovascular: Normal rate, regular rhythm and normal heart sounds.  Exam reveals no gallop and no friction rub.   No murmur heard. Pulmonary/Chest: Effort normal and breath sounds normal. No respiratory distress. He has no wheezes. He has no rales. He exhibits no tenderness.  Abdominal: Soft. He exhibits no distension and no mass. There is no tenderness. There is no rebound and no guarding.  Musculoskeletal: Normal range of motion. He exhibits no edema and no tenderness.  Tenderness to palpation about the left shoulder, left elbow, left ankle, and left great toe, minimal warmth and erythema is also noted, no evidence of septic joint  Neurological: He is alert and oriented to person, place, and time.  Skin: Skin is warm and dry.  Psychiatric: He has a normal mood and affect. His behavior is normal. Judgment and thought content normal.    ED Course  Procedures (including critical care time) Results for orders placed during the hospital encounter of 12/22/13  CBC WITH DIFFERENTIAL      Result Value Ref Range   WBC 11.6 (*) 4.0 - 10.5 K/uL   RBC 6.22 (*) 4.22 - 5.81 MIL/uL   Hemoglobin 13.5  13.0 - 17.0 g/dL   HCT 42.2  39.0 - 52.0 %   MCV 67.8 (*) 78.0 - 100.0 fL   MCH 21.7 (*) 26.0 - 34.0 pg   MCHC 32.0  30.0 - 36.0 g/dL   RDW 18.1 (*) 11.5 - 15.5 %   Platelets 198  150 - 400 K/uL   Neutrophils Relative % 76  43 - 77 %   Lymphocytes Relative 15  12 - 46 %   Monocytes Relative 9  3 - 12 %   Eosinophils Relative 0  0 - 5 %   Basophils Relative 0  0 - 1 %   Neutro Abs 8.9 (*) 1.7 - 7.7 K/uL    Lymphs Abs 1.7  0.7 - 4.0 K/uL   Monocytes Absolute 1.0  0.1 - 1.0 K/uL   Eosinophils Absolute 0.0  0.0 - 0.7 K/uL   Basophils Absolute 0.0  0.0 - 0.1 K/uL   Smear Review LARGE PLATELETS PRESENT    I-STAT CHEM 8, ED      Result Value Ref Range   Sodium 136 (*) 137 - 147 mEq/L   Potassium 4.6  3.7 - 5.3 mEq/L   Chloride 104  96 - 112 mEq/L   BUN 25 (*) 6 - 23 mg/dL   Creatinine, Ser 1.70 (*) 0.50 - 1.35 mg/dL   Glucose, Bld  99  70 - 99 mg/dL   Calcium, Ion 1.15  1.12 - 1.23 mmol/L   TCO2 23  0 - 100 mmol/L   Hemoglobin 16.7  13.0 - 17.0 g/dL   HCT 49.0  39.0 - 52.0 %  I-STAT CG4 LACTIC ACID, ED      Result Value Ref Range   Lactic Acid, Venous 1.89  0.5 - 2.2 mmol/L   No results found.    EKG Interpretation None      MDM   Final diagnoses:  Acute idiopathic gout of multiple sites    Patient with recurrent gout attacks. He has been instructed to followup with rheumatology, as this could be rheumatoid arthritis. I have stressed the fact that continuing to seek care with pain medicine and prednisone has not appropriate course of action in managing his symptoms. Patient understands and agrees. He'll followup with rheumatology.  12:15 PM Informed by nursing staff that the patient is quite hypotensive.  He does not feel dizzy.  He does take several BP medicines.  These could be too strong for him.  Will give him some fluids and reassess.  Move to the back.  2:09 PM Patient given 2L of fluid.  Feels much better.  Labs are near baseline.  Mild leukocytosis, likely from gout flare.  Normal lactate.  Patient seen by and discussed with Dr. Tomi Bamberger, who agrees with the plan.  Filed Vitals:   12/22/13 1400  BP: 117/57  Pulse: 102  Temp:   Resp:        Montine Circle, PA-C 12/22/13 1418

## 2013-12-28 ENCOUNTER — Ambulatory Visit (INDEPENDENT_AMBULATORY_CARE_PROVIDER_SITE_OTHER): Payer: 59 | Admitting: Internal Medicine

## 2013-12-28 ENCOUNTER — Encounter: Payer: Self-pay | Admitting: Internal Medicine

## 2013-12-28 VITALS — BP 116/74 | HR 81 | Ht 67.45 in | Wt 179.2 lb

## 2013-12-28 DIAGNOSIS — I4891 Unspecified atrial fibrillation: Secondary | ICD-10-CM

## 2013-12-28 DIAGNOSIS — I428 Other cardiomyopathies: Secondary | ICD-10-CM

## 2013-12-28 DIAGNOSIS — Z95810 Presence of automatic (implantable) cardiac defibrillator: Secondary | ICD-10-CM

## 2013-12-28 NOTE — Progress Notes (Signed)
Patient Care Team: No Pcp Per Patient as PCP - General (General Practice)   HPI  [name] is a 43 y.o. 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 He is status post ICD implantation with device generator replacement 2013 He also has a history of atrial fibrillation for which he previously took amiodarone;  He had elevated LFTs and amiodarone was discontinued qwith improvement    Last echo 7/14 >> EF 25-30,  Mild MR, severe LAE, mild RAE, mild TR, Hydralazine and nitrates were added to his medical regimen. Patient could not tolerate these due to significant headache. Both medications were discontinued  7/14 reverted to atrial fib, NOAC resumed.  At anticipated DCCV pt had reverted to sinus  The patient denies chest pain, shortness of breath, nocturnal dyspnea, orthopnea or peripheral edema.  There have been no palpitations, lightheadedness or syncope.     Past Medical History  Diagnosis Date  . Chronic systolic heart failure     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%)  . Atrial fibrillation -parosysmal      Rx w amiodarone  . Noncompliance     H/O  MEDICAL NONCOMPLIANCE  . 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 Tricuspid valve regurgitation     SEVERE  . Severe mitral regurgitation   . Polymorphic ventricular tachycardia     RECURRENT WITH APPROPRIATE SHOCK THERAPY IN THE PAST  . Ventricular fibrillation     WITH APPROPRIATE SHOCK THERAPY IN THE PAST  . Hypertension   . Gout   . RA (rheumatoid arthritis)   . Automatic implantable cardiac defibrillator -BSX     single chamber  . GI bleed -massive     11 Units 2012  . Elevated LFTs   . H/O hyperthyroidism     Past Surgical History  Procedure Laterality Date  . Cardiac catheterization  06/2006    RIGHT HEART CATH SHOWING SEVERE BIVENTRICUALR CHF WITH MARKED FILLING AND PRESSURES  . Insert / replace / remove pacemaker       GUIDANT HE ICD MODEL 2180, SERIAL # [id number]  . Cholecystectomy      Current Outpatient Prescriptions  Medication Sig Dispense Refill  . apixaban (ELIQUIS) 5 MG TABS tablet Take 1 tablet (5 mg total) by mouth 2 (two) times daily.  60 tablet  6  . carvedilol (COREG) 25 MG tablet Take 1 tablet (25 mg total) by mouth 2 (two) times daily with a meal.  60 tablet  6  . colchicine 0.6 MG tablet Take 0.6 mg by mouth daily as needed.      . furosemide (LASIX) 40 MG tablet Take 40 mg by mouth daily.      Marland Kitchen lisinopril (PRINIVIL,ZESTRIL) 40 MG tablet Take 1 tablet (40 mg total) by mouth daily.  30 tablet  5  . oxyCODONE-acetaminophen (PERCOCET/ROXICET) 5-325 MG per tablet Take 2 tablets by mouth every 6 (six) hours as needed for moderate pain or severe pain.      Marland Kitchen spironolactone (ALDACTONE) 25 MG tablet Take 1 tablet (25 mg total) by mouth daily.  30 tablet  1   No current facility-administered medications for this visit.    No Known Allergies  Review of Systems negative except from HPI and PMH  Physical Exam BP 116/74  Pulse 81  Ht 5' 7.45" (1.713 m)  Wt 179 lb 3.2 oz (81.285 kg)  BMI 27.70 kg/m2 Well developed and well nourished in  no acute distress HENT normal E scleral and icterus clear Neck Supple JVP flat; carotids brisk and full Clear to ausculation Irregular rate and rhythm, no murmurs gallops or rub Soft with active bowel sounds No clubbing cyanosis none Edema Alert and oriented, grossly normal motor and sensory function Skin Warm and Dry  ECG demonstrates atrial fibrillation/flutter  Assessment and  Plan    1. Nonischemic cardiomyopathy.   Continue current meds  2. Atrial fibrillation No intercurrent Atrial fibrillation or flutter  3. Ventricular tachycardia: No intercurrent ventricular tachycardia  4. Chronic systolic heart failure: Euvolemic.  5. Implantable defibrillator-[employer]-single chamber:The patient's device was interrogated.  The information  was reviewed. No changes were made in the programming.

## 2013-12-28 NOTE — Patient Instructions (Signed)

## 2013-12-29 LAB — MDC_IDC_ENUM_SESS_TYPE_INCLINIC
Battery Remaining Longevity: 138 mo
Battery Remaining Percentage: 100 %
Brady Statistic RV Percent Paced: 0 %
Date Time Interrogation Session: 20150910192500
HIGH POWER IMPEDANCE MEASURED VALUE: 51 Ohm
Lead Channel Impedance Value: 749 Ohm
Lead Channel Pacing Threshold Amplitude: 0.6 V
Lead Channel Pacing Threshold Pulse Width: 0.4 ms
MDC IDC PG SERIAL: 100391
MDC IDC SET LEADCHNL RV PACING AMPLITUDE: 2 V
MDC IDC SET LEADCHNL RV PACING PULSEWIDTH: 0.4 ms
MDC IDC SET LEADCHNL RV SENSING SENSITIVITY: 0.5 mV
Zone Setting Detection Interval: 273 ms

## 2014-01-01 ENCOUNTER — Encounter: Payer: Self-pay | Admitting: Internal Medicine

## 2014-01-02 ENCOUNTER — Other Ambulatory Visit: Payer: Self-pay | Admitting: *Deleted

## 2014-01-02 DIAGNOSIS — I5022 Chronic systolic (congestive) heart failure: Secondary | ICD-10-CM

## 2014-01-02 MED ORDER — SPIRONOLACTONE 25 MG PO TABS: 25.0000 mg | ORAL_TABLET | Freq: Every day | ORAL | Status: DC

## 2014-01-29 ENCOUNTER — Other Ambulatory Visit: Payer: Self-pay

## 2014-01-29 MED ORDER — FUROSEMIDE 40 MG PO TABS: 40.0000 mg | ORAL_TABLET | Freq: Every day | ORAL | Status: DC

## 2014-02-01 ENCOUNTER — Telehealth: Payer: Self-pay | Admitting: Internal Medicine

## 2014-02-01 NOTE — Telephone Encounter (Signed)
New message     Patient calling need a referral to Dr. Estanislado Pandy with [facility].

## 2014-02-01 NOTE — Telephone Encounter (Signed)
No PCP.  Patient states he has had gout flare ups several years ago and thinks Dr. Caryl Comes gave him a referral for this issue. He would like to know if Dr. Caryl Comes would give him referral as he is having a flare up.  Dr. Arlean Hopping  Office told him he would need referral to make appointment.  Informed him I would review with Dr. Caryl Comes and get back with him. Pt is agreeable.

## 2014-02-02 NOTE — Telephone Encounter (Signed)
Advised Dr. Caryl Comes stated he would not refer patient. I advised him to find a PCP or urgent care to help with this. He was thankful for helping in this matter.

## 2014-02-18 ENCOUNTER — Other Ambulatory Visit: Payer: Self-pay

## 2014-02-18 DIAGNOSIS — I428 Other cardiomyopathies: Secondary | ICD-10-CM

## 2014-02-18 MED ORDER — CARVEDILOL 25 MG PO TABS: 25.0000 mg | ORAL_TABLET | Freq: Two times a day (BID) | ORAL | Status: DC

## 2014-03-29 ENCOUNTER — Encounter (HOSPITAL_COMMUNITY): Payer: Self-pay | Admitting: Internal Medicine

## 2014-04-02 ENCOUNTER — Telehealth: Payer: Self-pay | Admitting: Cardiology

## 2014-04-02 ENCOUNTER — Encounter: Payer: 59 | Admitting: *Deleted

## 2014-04-02 NOTE — Telephone Encounter (Signed)
LMOVM reminding pt to send remote transmission.   

## 2014-04-06 ENCOUNTER — Encounter: Payer: Self-pay | Admitting: Cardiology

## 2014-04-29 ENCOUNTER — Emergency Department (HOSPITAL_COMMUNITY)
Admission: EM | Admit: 2014-04-29 | Discharge: 2014-04-29 | Disposition: A | Discharge status: Home / Self Care | Payer: 59 | Attending: Emergency Medicine | Admitting: Emergency Medicine

## 2014-04-29 ENCOUNTER — Encounter (HOSPITAL_COMMUNITY): Payer: Self-pay | Admitting: Adult Health

## 2014-04-29 DIAGNOSIS — I10 Essential (primary) hypertension: Secondary | ICD-10-CM | POA: Diagnosis not present

## 2014-04-29 DIAGNOSIS — Z79899 Other long term (current) drug therapy: Secondary | ICD-10-CM | POA: Insufficient documentation

## 2014-04-29 DIAGNOSIS — M2550 Pain in unspecified joint: Secondary | ICD-10-CM

## 2014-04-29 DIAGNOSIS — M10071 Idiopathic gout, right ankle and foot: Secondary | ICD-10-CM | POA: Insufficient documentation

## 2014-04-29 DIAGNOSIS — I5022 Chronic systolic (congestive) heart failure: Secondary | ICD-10-CM | POA: Diagnosis not present

## 2014-04-29 DIAGNOSIS — Z9889 Other specified postprocedural states: Secondary | ICD-10-CM | POA: Insufficient documentation

## 2014-04-29 DIAGNOSIS — Z8639 Personal history of other endocrine, nutritional and metabolic disease: Secondary | ICD-10-CM | POA: Insufficient documentation

## 2014-04-29 DIAGNOSIS — Z8719 Personal history of other diseases of the digestive system: Secondary | ICD-10-CM | POA: Diagnosis not present

## 2014-04-29 DIAGNOSIS — Z95810 Presence of automatic (implantable) cardiac defibrillator: Secondary | ICD-10-CM | POA: Insufficient documentation

## 2014-04-29 DIAGNOSIS — M10041 Idiopathic gout, right hand: Secondary | ICD-10-CM | POA: Diagnosis not present

## 2014-04-29 DIAGNOSIS — Z9119 Patient's noncompliance with other medical treatment and regimen: Secondary | ICD-10-CM | POA: Insufficient documentation

## 2014-04-29 DIAGNOSIS — Z8674 Personal history of sudden cardiac arrest: Secondary | ICD-10-CM | POA: Insufficient documentation

## 2014-04-29 DIAGNOSIS — M069 Rheumatoid arthritis, unspecified: Secondary | ICD-10-CM | POA: Insufficient documentation

## 2014-04-29 DIAGNOSIS — I480 Paroxysmal atrial fibrillation: Secondary | ICD-10-CM | POA: Diagnosis not present

## 2014-04-29 DIAGNOSIS — M109 Gout, unspecified: Secondary | ICD-10-CM | POA: Diagnosis present

## 2014-04-29 LAB — I-STAT CHEM 8, ED
BUN: 22 mg/dL (ref 6–23)
CALCIUM ION: 1.17 mmol/L (ref 1.12–1.23)
CHLORIDE: 100 meq/L (ref 96–112)
Creatinine, Ser: 1.2 mg/dL (ref 0.50–1.35)
GLUCOSE: 102 mg/dL — AB (ref 70–99)
HEMATOCRIT: 47 % (ref 39.0–52.0)
Hemoglobin: 16 g/dL (ref 13.0–17.0)
Potassium: 3.6 mmol/L (ref 3.5–5.1)
SODIUM: 140 mmol/L (ref 135–145)
TCO2: 23 mmol/L (ref 0–100)

## 2014-04-29 MED ORDER — OXYCODONE-ACETAMINOPHEN 5-325 MG PO TABS
1.0000 | ORAL_TABLET | Freq: Once | ORAL | Status: AC
Start: 2014-04-29 — End: 2014-04-29
  Administered 2014-04-29: 1 via ORAL
  Filled 2014-04-29: qty 1

## 2014-04-29 MED ORDER — OXYCODONE-ACETAMINOPHEN 5-325 MG PO TABS: 1.0000 | ORAL_TABLET | Freq: Once | ORAL | Status: DC

## 2014-04-29 MED ORDER — OXYCODONE-ACETAMINOPHEN 5-325 MG PO TABS: 1.0000 | ORAL_TABLET | ORAL | Status: DC | PRN

## 2014-04-29 MED ORDER — PREDNISONE 20 MG PO TABS
60.0000 mg | ORAL_TABLET | Freq: Once | ORAL | Status: AC
  Administered 2014-04-29: 60 mg via ORAL
  Filled 2014-04-29: qty 3

## 2014-04-29 MED ORDER — PREDNISONE 20 MG PO TABS: 40.0000 mg | ORAL_TABLET | Freq: Every day | ORAL | Status: DC

## 2014-04-29 NOTE — Discharge Instructions (Signed)
Follow the directions provided. Use the resources guide to establish care with a primary care provider for further management of this joint pain. Take the prednisone daily as directed and use the Percocet for pain. Don't hesitate to return for any new, worsening, or concerning symptoms.  SEEK IMMEDIATE MEDICAL CARE IF:  Your fingers or toes are numb or blue.  The pain is not responding to medications and continues to stay the same or get worse.  The pain in your joint becomes severe.  You develop a fever over 102 F (38.9 C).  It becomes impossible to move or use the joint.    Emergency Department Resource Guide 1) Find a Doctor and Pay Out of Pocket Although you won't have to find out who is covered by your insurance plan, it is a good idea to ask around and get recommendations. You will then need to call the office and see if the doctor you have chosen will accept you as a new patient and what types of options they offer for patients who are self-pay. Some doctors offer discounts or will set up payment plans for their patients who do not have insurance, but you will need to ask so you aren't surprised when you get to your appointment.  2) Contact Your Local Health Department Not all health departments have doctors that can see patients for sick visits, but many do, so it is worth a call to see if yours does. If you don't know where your local health department is, you can check in your phone book. The CDC also has a tool to help you locate your state's health department, and many state websites also have listings of all of their local health departments.  3) Find a [facility] If your illness is not likely to be very severe or complicated, you may want to try a walk in clinic. These are popping up all over the country in pharmacies, drugstores, and shopping centers. They're usually staffed by nurse practitioners or physician assistants that have been trained to treat common illnesses and  complaints. They're usually fairly quick and inexpensive. However, if you have serious medical issues or chronic medical problems, these are probably not your best option.  No Primary Care Doctor: - Call Health Connect at  [phone] - they can help you locate a primary care doctor that  accepts your insurance, provides certain services, etc. - Physician Referral Service- [phone]  Chronic Pain Problems: Organization         Address  Phone   Notes  [facility]  [phone] Patients need to be referred by their primary care doctor.   Medication Assistance: Organization         Address  Phone   Notes  [facility] Medication [facility] [address] [place], [place] [address] [phone] --Must be a resident of [facility] -- Must have NO insurance coverage whatsoever (no Medicaid/ Medicare, etc.) -- The pt. MUST have a primary care doctor that directs their care regularly and follows them in the community   MedAssist  [phone]   [employer]  [phone]    Agencies that provide inexpensive medical care: Organization         Address  Phone   Notes  [place]  [phone]   [facility]    [phone]   [facility] [place], [place] [address] [phone]  [facility] [address] [place] [phone]   Planned Parenthood    [phone]   [facility]    [phone]   [place] and [place] [address], [place] Phone:  [phone], Fax:  [phone] Hours of Operation:  9 am - 6 pm, M-F.  Also accepts Medicaid/Medicare and self-pay.  [facility] for [place] [address], [address], [place] Phone: [phone], Fax: [phone]. Hours of Operation:  8:30 am - 5:30 pm, M-F.  Also accepts Medicaid and self-pay.  [facility] [place] [address] [place] Phone: [phone]   [place], [place], [place] [phone], Ext. [phone] Mondays & Thursdays: 7-9 AM.  First 15 patients are seen on a first come, first serve basis.    [place] Providers:  Organization         Address  Phone   Notes  [facility] [address], [place] [phone] Also accepts self-pay patients.  [facility] [address], [place]  [phone]   [place], [address], [place] [phone]   [facility] [address], [place] [phone]   Lucianne Lei [address], [place]   [phone] Only accepts [place] Access [place] patients after they have their name applied to their card.   Self-Pay (no insurance) in [facility] [place]:  Organization         Address  Phone   Notes  Sickle Cell Patients, [facility] [place] [phone]   [facility] [place] [phone]   [facility] [place]  [place], [place], [place] [phone]   [facility]/Dr. Osei-Bonsu  [address] [place] or [address], [place] [phone] Phone number for both [place] and [place] locations is the same.  Urgent Medical and [facility] [address] [place] [phone]   [facility] [address] [place] or [address] [phone]   [facility] [address], [place] [phone], phone; [phone], fax Sees patients 1st and 3rd Saturday of every month.  Must not qualify for public or private insurance (i.e. Medicaid, Medicare, [place] Health Choice, Veterans' Benefits)  Household income should be no more than 200% of the poverty level  The clinic cannot treat you if you are pregnant or think you are pregnant  Sexually transmitted diseases are not treated at the clinic.    Dental Care: Organization         Address  Phone  Notes  [facility] [place] [phone] Accepts children up to age 97 who are enrolled in [place] or [place]; pregnant women with a Medicaid card; and children who have applied for Medicaid or [place] Health Choice, but were declined, whose parents can pay a reduced fee at time of service.  [facility] of [facility]  [address], [place] [phone] Accepts children up to age 11 who are enrolled in [place] or [place]; pregnant women with a Medicaid card; and  children who have applied for Medicaid or [place] Health Choice, but were declined, whose parents can pay a reduced fee at time of service.  [place] Adult Dental Access PROGRAM  [place] [phone] Patients are seen by appointment only. Walk-ins are not accepted. [place] will see patients 65 years of age and older. Monday - Tuesday (8am-5pm) Most Wednesdays (8:30-5pm) $30 per visit, cash only  [place] Adult Dental Access PRO[facility]  [address], [facility] [phone] Patients are seen by appointment only. Walk-ins are not accepted. [place] will see patients 17 years of age and older. One Wednesday Evening (Monthly: Volunteer Based).  $30 per visit, cash only  [place]  [phone] for adults; Children under age 53, call Graduate Pediatric Dentistry at [phone]. Children aged 31-14, please call [phone] to request a pediatric application.  Dental services are provided in all areas of dental care including fillings, crowns and bridges, complete and partial dentures, implants, gum treatment, root canals, and extractions. Preventive care is  also provided. Treatment is provided to both adults and children. Patients are selected via a lottery and there is often a waiting list.   [facility] [address] [place]  [phone] [email or web address]   [facility] [address] [place], [place] [phone], Ext. [phone] Second and Fourth Thursday of each month, opens at 6:30 AM; Clinic ends at 9 AM.  Patients are seen on a first-come first-served basis, and a limited number are seen during each clinic.   [facility]  [address] [place] Danker [place], [place] [phone]   Eligibility Requirements You must have lived in [place], [place], or [place] counties for at least the last three months.   You cannot be eligible for state or federal sponsored [employer], including [employer], [place], or [employer].   You generally cannot be eligible for healthcare insurance through your employer.    How to apply: Eligibility screenings are held every Tuesday and Wednesday afternoon from 1:00 pm until 4:00 pm. You do not need an appointment for the interview!  [facility] [address] [place], [place]   [place]  [place] Department  [place]  [phone]    Behavioral Health Resources in the Community: Intensive Outpatient Programs Organization         Address  Phone  Notes  [place]. [address] [place], [place] [phone]   [facility] Outpatient [address], [place], [place]   ADS: Alcohol & Drug Svcs [address] [place], [place]   [place] [address],  [place], [place] or [phone]   Substance Abuse Resources Organization         Address  Phone  Notes  Alcohol and Drug Services  [phone]   [place]  [phone]   [facility] [place]   Chinita Pester  [phone]   Residential & Outpatient Substance Abuse Program  [phone]   Psychological Services Organization         Address  Phone  Notes  [facility] [place]  [place]  [phone]   [place] [address], [place] or [phone]    Mobile Crisis Teams Organization  Address  Phone  Notes  [facility], Mobile Crisis Care Unit  [phone]   [facility]ive Psychotherapeutic Services  [address] [place], [place]   [facility] [address], [place] [phone]    Self-Help/Support Groups Organization         Address  Phone             Notes  [place]. of [place] - variety of support groups  [place] Call for more information  Narcotics Anonymous (NA), [facility] [address], [employer] [place]  2 meetings at this location   Special educational needs teacher         Address  Phone  Notes  [facility] [place],    [place]  [phone]   [facility]  [address] [place] [phone], [place], [place]   [place], [place] [phone] Admissions: 8am-3pm M-F  Incentives Substance [place] [phone]    [place], [place] [phone]   [facility] [address] [place], [place], [place]   [facility] [address],  [place], [place]   [facility] - Intensive Outpatient [address], [place], [place]   [facility] ([place].,  [place], [place] [phone] or [phone]   [facility] (RTS) [address] [place], [place] Accepts Medicaid  [facility] [place] [address]  [place] [phone]  Substance Abuse/Addiction Treatment   [facility] Organization         Address  Phone  Notes  [facility]  [phone]   Domenic Schwab, PhD [address] Arlis [place], [place]   [phone] or [phone]   [place], [place] [phone]   [facility] [address] [place], [place] [phone] Insurance/Medicaid/sponsorship through [facility] and Families [address], [address] [place]                                    [place], [place] [phone] [place] [address], [place] [phone]    Dr. Adele Schilder  [phone]   [facility] Dept. 1) [address] [place] 2) [place] 3)  [place] [address], [place] [phone]  [phone]   [place] [phone] or [phone] (After Hours)

## 2014-04-29 NOTE — ED Provider Notes (Signed)
CSN: [id number]     Arrival date & time 04/29/14  1644 History   This chart was scribed for non-physician practitioner, Britt Bottom, NP-C working with Jasper Riling. Alvino Chapel, MD, by Chester Holstein, ED Scribe. This patient was seen in room TR06C/TR06C and the patient's care was started at 5:26 PM.      Chief Complaint  Patient presents with  . Gout    The history is provided by the patient. No language interpreter was used.    HPI Comments: [name] is a 44 y.o. male with PMHx of gout who presents to the Emergency Department complaining of a gout flare up in right ankle and right hand with onset 2 nights ago. Pt notes 10/10 constant pain.   Pt notes last flare up was a couple months ago. Pt does not take any medication for gout.  Pt notes he was seen in ED before and given prednisone with relief. Pt denies PMHx of DM. Pt denies fall or injury.  Pt denies fever and dysuria.  Pt has no PCP.   Past Medical History  Diagnosis Date  . Chronic systolic heart failure     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%)  . Atrial fibrillation -parosysmal      Rx w amiodarone  . Noncompliance     H/O  MEDICAL NONCOMPLIANCE  . 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 Tricuspid valve regurgitation     SEVERE  . Severe mitral regurgitation   . Polymorphic ventricular tachycardia     RECURRENT WITH APPROPRIATE SHOCK THERAPY IN THE PAST  . Ventricular fibrillation     WITH APPROPRIATE SHOCK THERAPY IN THE PAST  . Hypertension   . Gout   . RA (rheumatoid arthritis)   . Automatic implantable cardiac defibrillator -BSX     single chamber  . GI bleed -massive     11 Units 2012  . Elevated LFTs   . H/O hyperthyroidism    Past Surgical History  Procedure Laterality Date  . Cardiac catheterization  06/2006    RIGHT HEART CATH SHOWING SEVERE BIVENTRICUALR CHF WITH MARKED FILLING AND PRESSURES  . Insert / replace / remove pacemaker      GUIDANT  HE ICD MODEL 2180, SERIAL # [id number]  . Cholecystectomy    . Implantable cardioverter defibrillator generator change N/A 07/01/2011    Procedure: IMPLANTABLE CARDIOVERTER DEFIBRILLATOR GENERATOR CHANGE;  Surgeon: Deboraha Sprang, MD;  [facility] LAB;  Service: Cardiovascular;  Laterality: N/A;   Family History  Problem Relation Age of Onset  . Heart failure Brother    History  Substance Use Topics  . Smoking status: Never Smoker   . Smokeless tobacco: Never Used  . Alcohol Use: No    Review of Systems  Constitutional: Negative for fever.  Genitourinary: Negative for dysuria.  Musculoskeletal: Positive for myalgias, joint swelling and arthralgias.      Allergies  Review of patient's allergies indicates no known allergies.  Home Medications   Prior to Admission medications   Medication Sig Start Date End Date Taking? Authorizing Provider  apixaban (ELIQUIS) 5 MG TABS tablet Take 1 tablet (5 mg total) by mouth 2 (two) times daily. 09/27/13   Deboraha Sprang, MD  carvedilol (COREG) 25 MG tablet Take 1 tablet (25 mg total) by mouth 2 (two) times daily with a meal. 02/18/14   Deboraha Sprang, MD  colchicine 0.6 MG tablet Take 0.6 mg by mouth  daily as needed. 10/30/13   Montine Circle, PA-C  furosemide (LASIX) 40 MG tablet Take 1 tablet (40 mg total) by mouth daily. 01/29/14   Evans Lance, MD  lisinopril (PRINIVIL,ZESTRIL) 40 MG tablet Take 1 tablet (40 mg total) by mouth daily. 10/13/13   Deboraha Sprang, MD  oxyCODONE-acetaminophen (PERCOCET/ROXICET) 5-325 MG per tablet Take 2 tablets by mouth every 6 (six) hours as needed for moderate pain or severe pain. 10/30/13   Montine Circle, PA-C  spironolactone (ALDACTONE) 25 MG tablet Take 1 tablet (25 mg total) by mouth daily. 01/02/14   Deboraha Sprang, MD   BP 97/59 mmHg  Pulse 73  Temp(Src) 99.5 F (37.5 C) (Oral)  Resp 18  SpO2 98% Physical Exam  Constitutional: He is oriented to person, place, and time. He appears well-developed  and well-nourished.  HENT:  Head: Normocephalic.  Eyes: Conjunctivae are normal.  Neck: Normal range of motion. Neck supple.  Pulmonary/Chest: Effort normal.  Musculoskeletal:       Left wrist: He exhibits tenderness.       Right ankle: He exhibits decreased range of motion and swelling. Tenderness.       Left hand: He exhibits tenderness.  mild erythema to right ankle Tightness and joint swelling noted along the PIPs and DIPs of left fingers, no erythema noted  Neurological: He is alert and oriented to person, place, and time.  Skin: Skin is warm and dry.  Psychiatric: He has a normal mood and affect. His behavior is normal.  Nursing note and vitals reviewed.   ED Course  Procedures (including critical care time) DIAGNOSTIC STUDIES: Oxygen Saturation is 98% on room air, normal by my interpretation.    COORDINATION OF CARE: 5:31 PM Discussed treatment plan with patient at beside, the patient agrees with the plan and has no further questions at this time.   Labs Review Labs Reviewed  I-STAT CHEM 8, ED - Abnormal; Notable for the following:    Glucose, Bld 102 (*)    All other components within normal limits    Imaging Review No results found.   EKG Interpretation None      MDM   Final diagnoses:  Articular pain   44 yo with recurrent pain and joint swelling in right ankle and multiple joints in right hand.  Has been treated for gout at previous visits.  Renal function checked and normal.  Pt reports he has been treated with prednisone at previous visits and that was very effective.  Pt is not diabetic and last course of steroids was 4-5 months ago.  No report of injury, imaging not indicated. Will treat with steroids and short course of pain meds.  Resources provided to establish care with a PCP for further eval and mgmt. Pt is well-appearing, in no acute distress and vital signs are stable.  They appear safe to be discharged.  Return precautions provided.  Pt aware of  plan and in agreement.  I personally performed the services described in this documentation, which was scribed in my presence. The recorded information has been reviewed and is accurate.  Filed Vitals:   04/29/14 1709 04/29/14 1806 04/29/14 1846  BP: 97/59 96/63 106/76  Pulse: 73 84 93  Temp: 99.5 F (37.5 C)  99.3 F (37.4 C)  TempSrc: Oral    Resp: 18 16   SpO2: 98% 99%    Meds given in ED:  Medications  predniSONE (DELTASONE) tablet 60 mg (60 mg Oral Given 04/29/14 1803)  oxyCODONE-acetaminophen (PERCOCET/ROXICET) 5-325 MG per tablet 1 tablet (1 tablet Oral Given 04/29/14 1803)    Discharge Medication List as of 04/29/2014  6:03 PM    START taking these medications   Details  !! oxyCODONE-acetaminophen (PERCOCET/ROXICET) 5-325 MG per tablet Take 1-2 tablets by mouth every 4 (four) hours as needed for moderate pain or severe pain., Starting 04/29/2014, Until Discontinued, Print    predniSONE (DELTASONE) 20 MG tablet Take 2 tablets (40 mg total) by mouth daily., Starting 04/29/2014, Until Discontinued, Print     !! - Potential duplicate medications found. Please discuss with provider.         Britt Bottom, NP 05/01/14 [place] Alvino Chapel, MD 05/02/14 [phone]

## 2014-04-29 NOTE — ED Notes (Signed)
Presents with gout flare in right hand, knee and ankle began Friday night and has worsened, joints warm and tender. HX of same.

## 2014-05-04 ENCOUNTER — Other Ambulatory Visit: Payer: Self-pay

## 2014-05-04 MED ORDER — LISINOPRIL 40 MG PO TABS
40.0000 mg | ORAL_TABLET | Freq: Every day | ORAL | Status: DC
Start: 2014-05-04 — End: 2014-11-23

## 2014-05-18 ENCOUNTER — Encounter: Payer: Self-pay | Admitting: *Deleted

## 2014-05-19 ENCOUNTER — Encounter (HOSPITAL_COMMUNITY): Payer: Self-pay | Admitting: *Deleted

## 2014-05-19 ENCOUNTER — Emergency Department (HOSPITAL_COMMUNITY): Payer: 59

## 2014-05-19 DIAGNOSIS — Z8674 Personal history of sudden cardiac arrest: Secondary | ICD-10-CM | POA: Diagnosis not present

## 2014-05-19 DIAGNOSIS — M109 Gout, unspecified: Secondary | ICD-10-CM | POA: Diagnosis not present

## 2014-05-19 DIAGNOSIS — Z8639 Personal history of other endocrine, nutritional and metabolic disease: Secondary | ICD-10-CM | POA: Insufficient documentation

## 2014-05-19 DIAGNOSIS — Y998 Other external cause status: Secondary | ICD-10-CM | POA: Diagnosis not present

## 2014-05-19 DIAGNOSIS — W000XXA Fall on same level due to ice and snow, initial encounter: Secondary | ICD-10-CM | POA: Diagnosis not present

## 2014-05-19 DIAGNOSIS — Z9889 Other specified postprocedural states: Secondary | ICD-10-CM | POA: Insufficient documentation

## 2014-05-19 DIAGNOSIS — Z95810 Presence of automatic (implantable) cardiac defibrillator: Secondary | ICD-10-CM | POA: Diagnosis not present

## 2014-05-19 DIAGNOSIS — Z9119 Patient's noncompliance with other medical treatment and regimen: Secondary | ICD-10-CM | POA: Insufficient documentation

## 2014-05-19 DIAGNOSIS — I340 Nonrheumatic mitral (valve) insufficiency: Secondary | ICD-10-CM | POA: Diagnosis not present

## 2014-05-19 DIAGNOSIS — Y9289 Other specified places as the place of occurrence of the external cause: Secondary | ICD-10-CM | POA: Insufficient documentation

## 2014-05-19 DIAGNOSIS — Y9389 Activity, other specified: Secondary | ICD-10-CM | POA: Diagnosis not present

## 2014-05-19 DIAGNOSIS — I472 Ventricular tachycardia: Secondary | ICD-10-CM | POA: Diagnosis not present

## 2014-05-19 DIAGNOSIS — I071 Rheumatic tricuspid insufficiency: Secondary | ICD-10-CM | POA: Diagnosis not present

## 2014-05-19 DIAGNOSIS — W01198A Fall on same level from slipping, tripping and stumbling with subsequent striking against other object, initial encounter: Secondary | ICD-10-CM | POA: Insufficient documentation

## 2014-05-19 DIAGNOSIS — Z7952 Long term (current) use of systemic steroids: Secondary | ICD-10-CM | POA: Diagnosis not present

## 2014-05-19 DIAGNOSIS — I4891 Unspecified atrial fibrillation: Secondary | ICD-10-CM | POA: Insufficient documentation

## 2014-05-19 DIAGNOSIS — I5022 Chronic systolic (congestive) heart failure: Secondary | ICD-10-CM | POA: Insufficient documentation

## 2014-05-19 DIAGNOSIS — S8991XA Unspecified injury of right lower leg, initial encounter: Secondary | ICD-10-CM | POA: Insufficient documentation

## 2014-05-19 DIAGNOSIS — Z7901 Long term (current) use of anticoagulants: Secondary | ICD-10-CM | POA: Insufficient documentation

## 2014-05-19 DIAGNOSIS — Z79899 Other long term (current) drug therapy: Secondary | ICD-10-CM | POA: Insufficient documentation

## 2014-05-19 DIAGNOSIS — M069 Rheumatoid arthritis, unspecified: Secondary | ICD-10-CM | POA: Insufficient documentation

## 2014-05-19 DIAGNOSIS — M25461 Effusion, right knee: Secondary | ICD-10-CM | POA: Insufficient documentation

## 2014-05-19 DIAGNOSIS — I10 Essential (primary) hypertension: Secondary | ICD-10-CM | POA: Insufficient documentation

## 2014-05-19 DIAGNOSIS — Z8719 Personal history of other diseases of the digestive system: Secondary | ICD-10-CM | POA: Diagnosis not present

## 2014-05-19 DIAGNOSIS — I4901 Ventricular fibrillation: Secondary | ICD-10-CM | POA: Insufficient documentation

## 2014-05-19 NOTE — ED Notes (Signed)
Pt in c/o pain to his right knee after slipping on the ice on Monday, states he hit his knee when he fell, denies other injuries

## 2014-05-20 ENCOUNTER — Emergency Department (HOSPITAL_COMMUNITY)
Admission: EM | Admit: 2014-05-20 | Discharge: 2014-05-20 | Disposition: A | Discharge status: Home / Self Care | Payer: 59 | Attending: Emergency Medicine | Admitting: Emergency Medicine

## 2014-05-20 DIAGNOSIS — M2540 Effusion, unspecified joint: Secondary | ICD-10-CM

## 2014-05-20 DIAGNOSIS — S8991XA Unspecified injury of right lower leg, initial encounter: Secondary | ICD-10-CM

## 2014-05-20 MED ORDER — OXYCODONE-ACETAMINOPHEN 5-325 MG PO TABS: 1.0000 | ORAL_TABLET | Freq: Four times a day (QID) | ORAL | Status: DC | PRN

## 2014-05-20 MED ORDER — PREDNISONE 20 MG PO TABS
60.0000 mg | ORAL_TABLET | Freq: Once | ORAL | Status: AC
  Administered 2014-05-20: 60 mg via ORAL
  Filled 2014-05-20: qty 3

## 2014-05-20 MED ORDER — OXYCODONE-ACETAMINOPHEN 5-325 MG PO TABS
1.0000 | ORAL_TABLET | Freq: Once | ORAL | Status: AC
  Administered 2014-05-20: 1 via ORAL
  Filled 2014-05-20: qty 1
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
## 2014-05-20 NOTE — Discharge Instructions (Signed)
Arthralgia °Your caregiver has diagnosed you as suffering from an arthralgia. Arthralgia means there is pain in a joint. This can come from many reasons including: °· Bruising the joint which causes soreness (inflammation) in the joint. °· Wear and tear on the joints which occur as we grow older (osteoarthritis). °· Overusing the joint. °· Various forms of arthritis. °· Infections of the joint. °Regardless of the cause of pain in your joint, most of these different pains respond to anti-inflammatory drugs and rest. The exception to this is when a joint is infected, and these cases are treated with antibiotics, if it is a bacterial infection. °HOME CARE INSTRUCTIONS  °· Rest the injured area for as long as directed by your caregiver. Then slowly start using the joint as directed by your caregiver and as the pain allows. Crutches as directed may be useful if the ankles, knees or hips are involved. If the knee was splinted or casted, continue use and care as directed. If an stretchy or elastic wrapping bandage has been applied today, it should be removed and re-applied every 3 to 4 hours. It should not be applied tightly, but firmly enough to keep swelling down. Watch toes and feet for swelling, bluish discoloration, coldness, numbness or excessive pain. If any of these problems (symptoms) occur, remove the ace bandage and re-apply more loosely. If these symptoms persist, contact your caregiver or return to this location. °· For the first 24 hours, keep the injured extremity elevated on pillows while lying down. °· Apply ice for 15-20 minutes to the sore joint every couple hours while awake for the first half day. Then 03-04 times per day for the first 48 hours. Put the ice in a plastic bag and place a towel between the bag of ice and your skin. °· Wear any splinting, casting, elastic bandage applications, or slings as instructed. °· Only take over-the-counter or prescription medicines for pain, discomfort, or fever as  directed by your caregiver. Do not use aspirin immediately after the injury unless instructed by your physician. Aspirin can cause increased bleeding and bruising of the tissues. °· If you were given crutches, continue to use them as instructed and do not resume weight bearing on the sore joint until instructed. °Persistent pain and inability to use the sore joint as directed for more than 2 to 3 days are warning signs indicating that you should see a caregiver for a follow-up visit as soon as possible. Initially, a hairline fracture (break in bone) may not be evident on X-rays. Persistent pain and swelling indicate that further evaluation, non-weight bearing or use of the joint (use of crutches or slings as instructed), or further X-rays are indicated. X-rays may sometimes not show a small fracture until a week or 10 days later. Make a follow-up appointment with your own caregiver or one to whom we have referred you. A radiologist (specialist in reading X-rays) may read your X-rays. Make sure you know how you are to obtain your X-ray results. Do not assume everything is normal if you do not hear from us. °SEEK MEDICAL CARE IF: °Bruising, swelling, or pain increases. °SEEK IMMEDIATE MEDICAL CARE IF:  °· Your fingers or toes are numb or blue. °· The pain is not responding to medications and continues to stay the same or get worse. °· The pain in your joint becomes severe. °· You develop a fever over 102° F (38.9° C). °· It becomes impossible to move or use the joint. °MAKE SURE YOU:  °·   Understand these instructions.  Will watch your condition.  Will get help right away if you are not doing well or get worse. Document Released: 04/06/2005 Document Revised: 06/29/2011 Document Reviewed: 11/23/2007 [facility] Patient Information 2015 [place], [place]. This information is not intended to replace advice given to you by your health care provider. Make sure you discuss any questions you have with your health care  provider.  Musculoskeletal Pain Musculoskeletal pain is muscle and boney aches and pains. These pains can occur in any part of the body. Your caregiver may treat you without knowing the cause of the pain. They may treat you if blood or urine tests, X-rays, and other tests were normal.  CAUSES There is often not a definite cause or reason for these pains. These pains may be caused by a type of germ (virus). The discomfort may also come from overuse. Overuse includes working out too hard when your body is not fit. Boney aches also come from weather changes. Bone is sensitive to atmospheric pressure changes. HOME CARE INSTRUCTIONS   Ask when your test results will be ready. Make sure you get your test results.  Only take over-the-counter or prescription medicines for pain, discomfort, or fever as directed by your caregiver. If you were given medications for your condition, do not drive, operate machinery or power tools, or sign legal documents for 24 hours. Do not drink alcohol. Do not take sleeping pills or other medications that may interfere with treatment.  Continue all activities unless the activities cause more pain. When the pain lessens, slowly resume normal activities. Gradually increase the intensity and duration of the activities or exercise.  During periods of severe pain, bed rest may be helpful. Lay or sit in any position that is comfortable.  Putting ice on the injured area.  Put ice in a bag.  Place a towel between your skin and the bag.  Leave the ice on for 15 to 20 minutes, 3 to 4 times a day.  Follow up with your caregiver for continued problems and no reason can be found for the pain. If the pain becomes worse or does not go away, it may be necessary to repeat tests or do additional testing. Your caregiver may need to look further for a possible cause. SEEK IMMEDIATE MEDICAL CARE IF:  You have pain that is getting worse and is not relieved by medications.  You develop  chest pain that is associated with shortness or breath, sweating, feeling sick to your stomach (nauseous), or throw up (vomit).  Your pain becomes localized to the abdomen.  You develop any new symptoms that seem different or that concern you. MAKE SURE YOU:   Understand these instructions.  Will watch your condition.  Will get help right away if you are not doing well or get worse. Document Released: 04/06/2005 Document Revised: 06/29/2011 Document Reviewed: 12/09/2012 [facility] Patient Information 2015 [place], [place]. This information is not intended to replace advice given to you by your health care provider. Make sure you discuss any questions you have with your health care provider.

## 2014-05-22 ENCOUNTER — Telehealth: Payer: Self-pay | Admitting: *Deleted

## 2014-05-22 ENCOUNTER — Encounter (HOSPITAL_BASED_OUTPATIENT_CLINIC_OR_DEPARTMENT_OTHER): Payer: Self-pay | Admitting: Emergency Medicine

## 2014-05-22 NOTE — Telephone Encounter (Signed)
Pt came in to get return to work note.  Pt does not have PCP.  NCM suggested he go to [facility] walk-in or Urgent Care for ED follow-up.

## 2014-05-22 NOTE — Care Management (Signed)
Pt turned away from [facility]...returned to [facility] ED.Marland KitchenMarland KitchenNCM contacted Flow Manager to assist with return to work note.  Gave pt PCP list for future needs.

## 2014-06-13 ENCOUNTER — Emergency Department (HOSPITAL_COMMUNITY)
Admission: EM | Admit: 2014-06-13 | Discharge: 2014-06-13 | Disposition: A | Discharge status: Home / Self Care | Payer: 59 | Attending: Emergency Medicine | Admitting: Emergency Medicine

## 2014-06-13 ENCOUNTER — Encounter (HOSPITAL_COMMUNITY): Payer: Self-pay | Admitting: Emergency Medicine

## 2014-06-13 DIAGNOSIS — N189 Chronic kidney disease, unspecified: Secondary | ICD-10-CM | POA: Diagnosis not present

## 2014-06-13 DIAGNOSIS — R252 Cramp and spasm: Secondary | ICD-10-CM | POA: Insufficient documentation

## 2014-06-13 DIAGNOSIS — Z8639 Personal history of other endocrine, nutritional and metabolic disease: Secondary | ICD-10-CM | POA: Diagnosis not present

## 2014-06-13 DIAGNOSIS — Z7901 Long term (current) use of anticoagulants: Secondary | ICD-10-CM | POA: Insufficient documentation

## 2014-06-13 DIAGNOSIS — I482 Chronic atrial fibrillation, unspecified: Secondary | ICD-10-CM

## 2014-06-13 DIAGNOSIS — I5022 Chronic systolic (congestive) heart failure: Secondary | ICD-10-CM | POA: Insufficient documentation

## 2014-06-13 DIAGNOSIS — Z9889 Other specified postprocedural states: Secondary | ICD-10-CM | POA: Insufficient documentation

## 2014-06-13 DIAGNOSIS — I129 Hypertensive chronic kidney disease with stage 1 through stage 4 chronic kidney disease, or unspecified chronic kidney disease: Secondary | ICD-10-CM | POA: Diagnosis not present

## 2014-06-13 DIAGNOSIS — Z8719 Personal history of other diseases of the digestive system: Secondary | ICD-10-CM | POA: Insufficient documentation

## 2014-06-13 DIAGNOSIS — M79662 Pain in left lower leg: Secondary | ICD-10-CM | POA: Diagnosis present

## 2014-06-13 DIAGNOSIS — Z79899 Other long term (current) drug therapy: Secondary | ICD-10-CM | POA: Insufficient documentation

## 2014-06-13 DIAGNOSIS — M109 Gout, unspecified: Secondary | ICD-10-CM | POA: Insufficient documentation

## 2014-06-13 DIAGNOSIS — Z8674 Personal history of sudden cardiac arrest: Secondary | ICD-10-CM | POA: Insufficient documentation

## 2014-06-13 LAB — I-STAT CHEM 8, ED
BUN: 34 mg/dL — ABNORMAL HIGH (ref 6–23)
CREATININE: 1.4 mg/dL — AB (ref 0.50–1.35)
Calcium, Ion: 1.2 mmol/L (ref 1.12–1.23)
Chloride: 108 mmol/L (ref 96–112)
GLUCOSE: 97 mg/dL (ref 70–99)
HEMATOCRIT: 43 % (ref 39.0–52.0)
HEMOGLOBIN: 14.6 g/dL (ref 13.0–17.0)
Potassium: 4.4 mmol/L (ref 3.5–5.1)
Sodium: 142 mmol/L (ref 135–145)
TCO2: 21 mmol/L (ref 0–100)

## 2014-06-13 LAB — MAGNESIUM: Magnesium: 2.4 mg/dL (ref 1.5–2.5)

## 2014-06-13 MED ORDER — CARVEDILOL 25 MG PO TABS
25.0000 mg | ORAL_TABLET | Freq: Once | ORAL | Status: AC
  Administered 2014-06-13: 25 mg via ORAL
  Filled 2014-06-13: qty 1

## 2014-06-13 NOTE — ED Notes (Signed)
Arrived via EMS on stretcher c/o left lower leg cramp that suddenly awakened him from his sleep. Pedal pulses are present per report. Stroke screen preformed (-). Denies injury to lower leg or recent illnesses. Hst of A fib on eloquis. Pacemaker present on assessment by ED RN.

## 2014-06-13 NOTE — Discharge Instructions (Signed)
Use tylenol or ibuprofen for pain. Stay well hydrated. If cramps occur, stretch the affected muscle group as shown today. Continue your normal home medications. Follow up with your regular doctor in 3-5 days for recheck. Return to the ER for changes or worsening symptoms.   Muscle Cramps and Spasms Muscle cramps and spasms occur when a muscle or muscles tighten and you have no control over this tightening (involuntary muscle contraction). They are a common problem and can develop in any muscle. The most common place is in the calf muscles of the leg. Both muscle cramps and muscle spasms are involuntary muscle contractions, but they also have differences:   Muscle cramps are sporadic and painful. They may last a few seconds to a quarter of an hour. Muscle cramps are often more forceful and last longer than muscle spasms.  Muscle spasms may or may not be painful. They may also last just a few seconds or much longer. CAUSES  It is uncommon for cramps or spasms to be due to a serious underlying problem. In many cases, the cause of cramps or spasms is unknown. Some common causes are:   Overexertion.   Overuse from repetitive motions (doing the same thing over and over).   Remaining in a certain position for a long period of time.   Improper preparation, form, or technique while performing a sport or activity.   Dehydration.   Injury.   Side effects of some medicines.   Abnormally low levels of the salts and ions in your blood (electrolytes), especially potassium and calcium. This could happen if you are taking water pills (diuretics) or you are pregnant.  Some underlying medical problems can make it more likely to develop cramps or spasms. These include, but are not limited to:   Diabetes.   Parkinson disease.   Hormone disorders, such as thyroid problems.   Alcohol abuse.   Diseases specific to muscles, joints, and bones.   Blood vessel disease where not enough blood  is getting to the muscles.  HOME CARE INSTRUCTIONS   Stay well hydrated. Drink enough water and fluids to keep your urine clear or pale yellow.  It may be helpful to massage, stretch, and relax the affected muscle.  For tight or tense muscles, use a warm towel, heating pad, or hot shower water directed to the affected area.  If you are sore or have pain after a cramp or spasm, applying ice to the affected area may relieve discomfort.  Put ice in a plastic bag.  Place a towel between your skin and the bag.  Leave the ice on for 15-20 minutes, 03-04 times a day.  Medicines used to treat a known cause of cramps or spasms may help reduce their frequency or severity. Only take over-the-counter or prescription medicines as directed by your caregiver. SEEK MEDICAL CARE IF:  Your cramps or spasms get more severe, more frequent, or do not improve over time.  MAKE SURE YOU:   Understand these instructions.  Will watch your condition.  Will get help right away if you are not doing well or get worse. Document Released: 09/26/2001 Document Revised: 08/01/2012 Document Reviewed: 03/23/2012 [facility] Patient Information 2015 [place], [place]. This information is not intended to replace advice given to you by your health care provider. Make sure you discuss any questions you have with your health care provider.

## 2014-06-13 NOTE — ED Provider Notes (Signed)
CSN: [id number]     Arrival date & time 06/13/14  0605 History   First MD Initiated Contact with Patient 06/13/14 [phone]     Chief Complaint  Patient presents with  . Leg Pain     (Consider location/radiation/quality/duration/timing/severity/associated sxs/prior Treatment) HPI Comments: [name] is a 44 y.o. male with a PMHx of CHF, pacemaker placement, paroxysmal Afib on eliquis, tricuspid and mitral valve regurg, RA, HTN, Gout, remote GI bleed, transaminitis, and hyperthyroidism, who presents to the ED with complaints of 10/10 cramping aching constant pain in the left calf that began 1 hour prior to arrival. Patient reports that the pain is constant and nonradiating with no aggravating or alleviating factors given that he has not tried any medications prior to arrival. He reports that he is having difficulty with ankle movement due to the pain. Denies any fevers, chills, chest pain or shortness breath, abdominal pain, nausea, vomiting, diarrhea constipation, dysuria, hematuria, numbness, tingling, weakness, swelling, injury, rashes, color changes, or new medications. He reports he is compliant with all his medications, but he has not yet taken them for this morning and he normally takes them at this time. He takes Lasix, lisinopril, spironolactone, eliquis, and carvedilol.  Patient is a 44 y.o. male presenting with leg pain. The history is provided by the patient. No language interpreter was used.  Leg Pain Location:  Leg Time since incident:  1 hour Injury: no   Leg location:  L lower leg Pain details:    Quality:  Cramping   Radiates to:  Does not radiate   Severity:  Severe   Onset quality:  Sudden   Duration:  1 hour   Timing:  Constant   Progression:  Unchanged Chronicity:  New Prior injury to area:  No Relieved by:  None tried Worsened by:  Nothing tried Ineffective treatments:  None tried Associated symptoms: decreased ROM (due to pain)   Associated symptoms: no back  pain, no fever, no muscle weakness, no numbness, no stiffness, no swelling and no tingling     Past Medical History  Diagnosis Date  . Chronic systolic heart failure     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%)  . Atrial fibrillation -parosysmal      Rx w amiodarone  . Noncompliance     H/O  MEDICAL NONCOMPLIANCE  . 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 Tricuspid valve regurgitation     SEVERE  . Severe mitral regurgitation   . Polymorphic ventricular tachycardia     RECURRENT WITH APPROPRIATE SHOCK THERAPY IN THE PAST  . Ventricular fibrillation     WITH APPROPRIATE SHOCK THERAPY IN THE PAST  . Hypertension   . Gout   . RA (rheumatoid arthritis)   . Automatic implantable cardiac defibrillator -BSX     single chamber  . GI bleed -massive     11 Units 2012  . Elevated LFTs   . H/O hyperthyroidism    Past Surgical History  Procedure Laterality Date  . Cardiac catheterization  06/2006    RIGHT HEART CATH SHOWING SEVERE BIVENTRICUALR CHF WITH MARKED FILLING AND PRESSURES  . Insert / replace / remove pacemaker      GUIDANT HE ICD MODEL 2180, SERIAL # [id number]  . Cholecystectomy    . Implantable cardioverter defibrillator generator change N/A 07/01/2011    Procedure: IMPLANTABLE CARDIOVERTER DEFIBRILLATOR GENERATOR CHANGE;  Surgeon: Deboraha Sprang, MD;  Location: [facility] CATH LAB;  Service: Cardiovascular;  Laterality: N/A;   Family History  Problem Relation Age of Onset  . Heart failure Brother    History  Substance Use Topics  . Smoking status: Never Smoker   . Smokeless tobacco: Never Used  . Alcohol Use: No    Review of Systems  Constitutional: Negative for fever and chills.  Respiratory: Negative for shortness of breath.   Cardiovascular: Negative for chest pain and leg swelling.  Gastrointestinal: Negative for nausea, vomiting, abdominal pain, diarrhea and constipation.  Genitourinary: Negative for dysuria and  hematuria.  Musculoskeletal: Positive for myalgias (L lower leg cramp). Negative for back pain, joint swelling, arthralgias and stiffness.  Skin: Negative for color change and rash.  Neurological: Negative for weakness and numbness.  Psychiatric/Behavioral: Negative for confusion.   10 Systems reviewed and are negative for acute change except as noted in the HPI.    Allergies  Review of patient's allergies indicates no known allergies.  Home Medications   Prior to Admission medications   Medication Sig Start Date End Date Taking? Authorizing Provider  apixaban (ELIQUIS) 5 MG TABS tablet Take 1 tablet (5 mg total) by mouth 2 (two) times daily. 09/27/13   Deboraha Sprang, MD  carvedilol (COREG) 25 MG tablet Take 1 tablet (25 mg total) by mouth 2 (two) times daily with a meal. 02/18/14   Deboraha Sprang, MD  colchicine 0.6 MG tablet Take 0.6 mg by mouth daily as needed. 10/30/13   Montine Circle, PA-C  furosemide (LASIX) 40 MG tablet Take 1 tablet (40 mg total) by mouth daily. 01/29/14   Evans Lance, MD  lisinopril (PRINIVIL,ZESTRIL) 40 MG tablet Take 1 tablet (40 mg total) by mouth daily. 05/04/14   Deboraha Sprang, MD  oxyCODONE-acetaminophen (PERCOCET/ROXICET) 5-325 MG per tablet Take 2 tablets by mouth every 6 (six) hours as needed for moderate pain or severe pain. 10/30/13   Montine Circle, PA-C  oxyCODONE-acetaminophen (PERCOCET/ROXICET) 5-325 MG per tablet Take 1-2 tablets by mouth every 4 (four) hours as needed for moderate pain or severe pain. 04/29/14   Britt Bottom, NP  oxyCODONE-acetaminophen (PERCOCET/ROXICET) 5-325 MG per tablet Take 1 tablet by mouth every 6 (six) hours as needed for severe pain. 05/20/14   Tiffany Marilu Favre, PA-C  predniSONE (DELTASONE) 20 MG tablet Take 2 tablets (40 mg total) by mouth daily. 04/29/14   Britt Bottom, NP  spironolactone (ALDACTONE) 25 MG tablet Take 1 tablet (25 mg total) by mouth daily. 01/02/14   Deboraha Sprang, MD   BP 99/70 mmHg   Pulse 114  Temp(Src) 97.5 F (36.4 C) (Oral)  Ht 5\' 7"  (1.702 m)  Wt 170 lb (77.111 kg)  BMI 26.62 kg/m2  SpO2 100% Physical Exam  Constitutional: He is oriented to person, place, and time. He appears well-developed and well-nourished.  Non-toxic appearance. No distress.  Afebrile, nontoxic, appears slightly uncomfortable but in NAD, tachycardic from 100-120s. BP 99/70 which is baseline for pt  HENT:  Head: Normocephalic and atraumatic.  Mouth/Throat: Mucous membranes are normal.  Eyes: Conjunctivae and EOM are normal. Right eye exhibits no discharge. Left eye exhibits no discharge.  Neck: Normal range of motion. Neck supple.  Cardiovascular: Normal heart sounds and intact distal pulses.  A regularly irregular rhythm present. Tachycardia present.  Exam reveals no gallop and no friction rub.   No murmur heard. Pulses:      Femoral pulses are 2+ on the right side, and 2+ on the left side.      Dorsalis pedis  pulses are 2+ on the right side, and 1+ on the left side.       Posterior tibial pulses are 2+ on the right side, and 1+ on the left side.  Tachycardic, regularly irregular rhythm, nl s1/s2, no m/r/g, distal pulses intact although just slightly diminished on L DP/PT but femorals intact equally, no pedal edema  Pulmonary/Chest: Effort normal and breath sounds normal. No respiratory distress. He has no decreased breath sounds. He has no wheezes. He has no rhonchi. He has no rales.  Abdominal: Soft. Normal appearance and bowel sounds are normal. He exhibits no distension. There is no tenderness. There is no rigidity, no rebound and no guarding.  Musculoskeletal: Normal range of motion.       Left lower leg: He exhibits tenderness. He exhibits no bony tenderness, no swelling, no edema and no deformity.  L calf with mild spasm and TTP noted initially which resolved after forced dorsiflexion. FROM intact at hip, knee, and ankle. Wiggle's all digits. No bony TTP or deformity. Strength 5/5 in all  extremities, sensation grossly intact, distal pulses intact. No pedal edema. Neg homan's sign.  Neurological: He is alert and oriented to person, place, and time. He has normal strength. No sensory deficit.  Skin: Skin is warm, dry and intact. No rash noted.  No rashes or erythema over exposed surfaces  Psychiatric: He has a normal mood and affect.  Nursing note and vitals reviewed.   ED Course  Procedures (including critical care time) Labs Review Labs Reviewed  I-STAT CHEM 8, ED - Abnormal; Notable for the following:    BUN 34 (*)    Creatinine, Ser 1.40 (*)    All other components within normal limits  MAGNESIUM    Imaging Review No results found.   EKG Interpretation   Date/Time:  Wednesday June 13 2014 06:16:39 EST Ventricular Rate:  122 PR Interval:    QRS Duration: 97 QT Interval:  360 QTC Calculation: 513 R Axis:   77 Text Interpretation:  Atrial fibrillation Low voltage, extremity leads  Abnrm T, consider ischemia, anterolateral lds Confirmed by OTTER  MD, OLGA  ([id number]) on 06/13/2014 6:21:05 AM      MDM   Final diagnoses:  Cramp in lower leg  CKD (chronic kidney disease), unspecified stage  Chronic atrial fibrillation    44 y.o. male with L leg cramp which resolved after forced dorsiflexion during exam. On multiple meds that may cause electrolyte disturbances, will check these. Tachycardic on arrival since he didn't take his home morning meds, will give coreg now. EKG showing afib, rate ~110, one PVC noted. Pt states pain improved after exam therefore will hold on given pain meds now. Will reassess after labs. Doubt DVT/PE, no hypoxia or CP/SOB complaints, no pedal edema. Will reassess shortly.   8:17 AM Pain continues to be resolved. Tachycardia resolving after coreg given. Labs unremarkable aside from baseline kidney function elevations (BUN 34, Cr 1.4). Discussed staying well hydrated. Discussed ibuprofen/tylenol for pain. Will have him f/up with PCP in  3-5 days. I explained the diagnosis and have given explicit precautions to return to the ER including for any other new or worsening symptoms. The patient understands and accepts the medical plan as it's been dictated and I have answered their questions. Discharge instructions concerning home care and prescriptions have been given. The patient is STABLE and is discharged to home in good condition.    Filed Vitals:   06/13/14 [id number] 06/13/14 [id number] 06/13/14 0701 06/13/14 0732  BP: 99/70 116/74 98/66 97/77   Pulse: 114 101 89 103  Temp: 97.5 F (36.4 C)     TempSrc: Oral     Resp:   19 20  Height: 5\' 7"  (1.702 m)     Weight: 170 lb (77.111 kg)     SpO2: 100%  100% 100%   Meds ordered this encounter  Medications  . carvedilol (COREG) tablet 25 mg    Sig:      Patty Sermons [place], PA-C 06/13/14 [place], MD 06/14/14 1409

## 2014-06-26 ENCOUNTER — Encounter: Payer: Self-pay | Admitting: *Deleted

## 2014-07-25 ENCOUNTER — Other Ambulatory Visit: Payer: Self-pay

## 2014-07-25 DIAGNOSIS — I5022 Chronic systolic (congestive) heart failure: Secondary | ICD-10-CM

## 2014-07-25 MED ORDER — SPIRONOLACTONE 25 MG PO TABS: 25.0000 mg | ORAL_TABLET | Freq: Every day | ORAL | Status: DC

## 2014-08-24 ENCOUNTER — Other Ambulatory Visit: Payer: Self-pay | Admitting: *Deleted

## 2014-08-24 MED ORDER — APIXABAN 5 MG PO TABS: 5.0000 mg | ORAL_TABLET | Freq: Two times a day (BID) | ORAL | Status: DC

## 2014-11-09 ENCOUNTER — Emergency Department (HOSPITAL_COMMUNITY): Payer: 59

## 2014-11-09 ENCOUNTER — Emergency Department (HOSPITAL_COMMUNITY)
Admission: EM | Admit: 2014-11-09 | Discharge: 2014-11-09 | Disposition: A | Discharge status: Home / Self Care | Payer: 59 | Attending: Emergency Medicine | Admitting: Emergency Medicine

## 2014-11-09 ENCOUNTER — Encounter (HOSPITAL_COMMUNITY): Payer: Self-pay | Admitting: Emergency Medicine

## 2014-11-09 DIAGNOSIS — R1084 Generalized abdominal pain: Secondary | ICD-10-CM | POA: Diagnosis present

## 2014-11-09 DIAGNOSIS — Z79899 Other long term (current) drug therapy: Secondary | ICD-10-CM | POA: Diagnosis not present

## 2014-11-09 DIAGNOSIS — Z9119 Patient's noncompliance with other medical treatment and regimen: Secondary | ICD-10-CM | POA: Insufficient documentation

## 2014-11-09 DIAGNOSIS — I10 Essential (primary) hypertension: Secondary | ICD-10-CM | POA: Diagnosis not present

## 2014-11-09 DIAGNOSIS — M109 Gout, unspecified: Secondary | ICD-10-CM | POA: Diagnosis not present

## 2014-11-09 DIAGNOSIS — I4891 Unspecified atrial fibrillation: Secondary | ICD-10-CM | POA: Diagnosis not present

## 2014-11-09 DIAGNOSIS — R140 Abdominal distension (gaseous): Secondary | ICD-10-CM | POA: Insufficient documentation

## 2014-11-09 DIAGNOSIS — I5022 Chronic systolic (congestive) heart failure: Secondary | ICD-10-CM | POA: Insufficient documentation

## 2014-11-09 LAB — CBC
HEMATOCRIT: 43 % (ref 39.0–52.0)
Hemoglobin: 13.9 g/dL (ref 13.0–17.0)
MCH: 22.7 pg — AB (ref 26.0–34.0)
MCHC: 32.3 g/dL (ref 30.0–36.0)
MCV: 70.3 fL — ABNORMAL LOW (ref 78.0–100.0)
Platelets: 229 10*3/uL (ref 150–400)
RBC: 6.12 MIL/uL — AB (ref 4.22–5.81)
RDW: 16.5 % — ABNORMAL HIGH (ref 11.5–15.5)
WBC: 9.9 10*3/uL (ref 4.0–10.5)

## 2014-11-09 LAB — COMPREHENSIVE METABOLIC PANEL
ALK PHOS: 59 U/L (ref 38–126)
ALT: 13 U/L — ABNORMAL LOW (ref 17–63)
ANION GAP: 10 (ref 5–15)
AST: 18 U/L (ref 15–41)
Albumin: 3.5 g/dL (ref 3.5–5.0)
BILIRUBIN TOTAL: 0.6 mg/dL (ref 0.3–1.2)
BUN: 36 mg/dL — ABNORMAL HIGH (ref 6–20)
CO2: 23 mmol/L (ref 22–32)
Calcium: 9.1 mg/dL (ref 8.9–10.3)
Chloride: 107 mmol/L (ref 101–111)
Creatinine, Ser: 1.46 mg/dL — ABNORMAL HIGH (ref 0.61–1.24)
GFR calc Af Amer: >60 mL/min (ref >60)
GFR, EST NON AFRICAN AMERICAN: 56 mL/min — AB (ref >60)
Glucose, Bld: 107 mg/dL — ABNORMAL HIGH (ref 65–99)
Potassium: 4.2 mmol/L (ref 3.5–5.1)
Sodium: 140 mmol/L (ref 135–145)
TOTAL PROTEIN: 7.3 g/dL (ref 6.5–8.1)

## 2014-11-09 LAB — LIPASE, BLOOD: Lipase: 36 U/L (ref 22–51)

## 2014-11-09 LAB — URINALYSIS, ROUTINE W REFLEX MICROSCOPIC
Bilirubin Urine: NEGATIVE
Glucose, UA: NEGATIVE mg/dL
Hgb urine dipstick: NEGATIVE
Ketones, ur: NEGATIVE mg/dL
Leukocytes, UA: NEGATIVE
Nitrite: NEGATIVE
Protein, ur: 30 mg/dL — AB
SPECIFIC GRAVITY, URINE: 1.022 (ref 1.005–1.030)
Urobilinogen, UA: 1 mg/dL (ref 0.0–1.0)
pH: 6 (ref 5.0–8.0)

## 2014-11-09 LAB — I-STAT TROPONIN, ED: TROPONIN I, POC: 0.01 ng/mL (ref 0.00–0.08)

## 2014-11-09 LAB — BRAIN NATRIURETIC PEPTIDE: B NATRIURETIC PEPTIDE 5: 534.3 pg/mL — AB (ref 0.0–100.0)

## 2014-11-09 LAB — URINE MICROSCOPIC-ADD ON

## 2014-11-09 MED ORDER — FUROSEMIDE 10 MG/ML IJ SOLN
40.0000 mg | Freq: Once | INTRAMUSCULAR | Status: AC
  Administered 2014-11-09: 40 mg via INTRAVENOUS
  Filled 2014-11-09: qty 4

## 2014-11-09 MED ORDER — CARVEDILOL 25 MG PO TABS
25.0000 mg | ORAL_TABLET | Freq: Two times a day (BID) | ORAL | Status: DC
  Administered 2014-11-09: 25 mg via ORAL
  Filled 2014-11-09 (×3): qty 1

## 2014-11-09 NOTE — ED Notes (Signed)
ekg given to Dr. Hillard Danker.

## 2014-11-09 NOTE — ED Notes (Signed)
Waiting on coreg from pharmacy, moved to [place] c to await.  nad noted, pt dressed. Report given to [place], [place]

## 2014-11-09 NOTE — ED Notes (Signed)
Pt. Stated, I started having abdominal pain yesterday , no other symptoms.

## 2014-11-09 NOTE — ED Notes (Signed)
Med requested from pharmacy.

## 2014-11-09 NOTE — ED Provider Notes (Signed)
CSN: [id number]     Arrival date & time 11/09/14  0708 History   First MD Initiated Contact with Patient 11/09/14 0732     Chief Complaint  Patient presents with  . Abdominal Pain   Patient is a 44 y.o. male presenting with abdominal pain. The history is provided by the patient.  Abdominal Pain Pain location:  Generalized Pain quality: aching and bloating   Pain radiates to:  Does not radiate Pain severity:  Moderate Onset quality:  Gradual Duration:  1 day Timing:  Constant Chronicity:  Recurrent (In the past he was retaining fluid.) Associated symptoms: shortness of breath (every now and then, none currenttly)   Associated symptoms: no chest pain, no cough, no dysuria, no fever and no vomiting  Diarrhea: some loose stools.   Risk factors: no alcohol abuse    Pt started having pain on the left side of his abdomen yesterday.  He also feels like his stomach is bloated and he is retaining fluid. Past Medical History  Diagnosis Date  . Chronic systolic heart failure     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%)  . Atrial fibrillation -parosysmal      Rx w amiodarone  . Noncompliance     H/O  MEDICAL NONCOMPLIANCE  . 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 Tricuspid valve regurgitation     SEVERE  . Severe mitral regurgitation   . Polymorphic ventricular tachycardia     RECURRENT WITH APPROPRIATE SHOCK THERAPY IN THE PAST  . Ventricular fibrillation     WITH APPROPRIATE SHOCK THERAPY IN THE PAST  . Hypertension   . Gout   . RA (rheumatoid arthritis)   . Automatic implantable cardiac defibrillator -BSX     single chamber  . GI bleed -massive     11 Units 2012  . Elevated LFTs   . H/O hyperthyroidism   . CHF (congestive heart failure)    Past Surgical History  Procedure Laterality Date  . Cardiac catheterization  06/2006    RIGHT HEART CATH SHOWING SEVERE BIVENTRICUALR CHF WITH MARKED FILLING AND PRESSURES  . Insert /  replace / remove pacemaker      GUIDANT HE ICD MODEL 2180, SERIAL # [id number]  . Cholecystectomy    . Implantable cardioverter defibrillator generator change N/A 07/01/2011    Procedure: IMPLANTABLE CARDIOVERTER DEFIBRILLATOR GENERATOR CHANGE;  Surgeon: Deboraha Sprang, MD;  Location: [facility] CATH LAB;  Service: Cardiovascular;  Laterality: N/A;   Family History  Problem Relation Age of Onset  . Heart failure Brother    History  Substance Use Topics  . Smoking status: Never Smoker   . Smokeless tobacco: Never Used  . Alcohol Use: No    Review of Systems  Constitutional: Negative for fever.  Respiratory: Positive for shortness of breath (every now and then, none currenttly). Negative for cough.   Cardiovascular: Negative for chest pain.  Gastrointestinal: Positive for abdominal pain. Negative for vomiting. Diarrhea: some loose stools.  Genitourinary: Negative for dysuria.  All other systems reviewed and are negative.     Allergies  Review of patient's allergies indicates no known allergies.  Home Medications   Prior to Admission medications   Medication Sig Start Date End Date Taking? Authorizing Provider  allopurinol (ZYLOPRIM) 100 MG tablet Take 100 mg by mouth daily. 10/31/14  Yes Historical Provider, MD  apixaban (ELIQUIS) 5 MG TABS tablet Take 1 tablet (5 mg total) by mouth 2 (two) times  daily. 08/24/14  Yes Deboraha Sprang, MD  carvedilol (COREG) 25 MG tablet Take 1 tablet (25 mg total) by mouth 2 (two) times daily with a meal. 02/18/14  Yes Deboraha Sprang, MD  furosemide (LASIX) 40 MG tablet Take 1 tablet (40 mg total) by mouth daily. 01/29/14  Yes Evans Lance, MD  lisinopril (PRINIVIL,ZESTRIL) 40 MG tablet Take 1 tablet (40 mg total) by mouth daily. 05/04/14  Yes Deboraha Sprang, MD  spironolactone (ALDACTONE) 25 MG tablet Take 1 tablet (25 mg total) by mouth daily. 07/25/14  Yes Deboraha Sprang, MD   BP 136/87 mmHg  Pulse 64  Temp(Src) 98.1 F (36.7 C)  Resp 16  Ht 5\' 7"  (1.702  m)  Wt 185 lb (83.915 kg)  BMI 28.97 kg/m2  SpO2 100% Physical Exam  Constitutional: He appears well-developed and well-nourished. No distress.  HENT:  Head: Normocephalic and atraumatic.  Right Ear: External ear normal.  Left Ear: External ear normal.  Eyes: Conjunctivae are normal. Right eye exhibits no discharge. Left eye exhibits no discharge. No scleral icterus.  Neck: Neck supple. No tracheal deviation present.  Cardiovascular: Normal rate and intact distal pulses.  An irregularly irregular rhythm present.  Pulmonary/Chest: Effort normal and breath sounds normal. No stridor. No respiratory distress. He has no wheezes. He has no rales.  Abdominal: Soft. Bowel sounds are normal. He exhibits no distension, no fluid wave, no ascites and no mass. There is no tenderness. There is no rebound and no guarding. No hernia.  Musculoskeletal: He exhibits no edema or tenderness.  Neurological: He is alert. He has normal strength. No cranial nerve deficit (no facial droop, extraocular movements intact, no slurred speech) or sensory deficit. He exhibits normal muscle tone. He displays no seizure activity. Coordination normal.  Skin: Skin is warm and dry. No rash noted.  Psychiatric: He has a normal mood and affect.  Nursing note and vitals reviewed.   ED Course  Procedures (including critical care time) Labs Review Labs Reviewed  COMPREHENSIVE METABOLIC PANEL - Abnormal; Notable for the following:    Glucose, Bld 107 (*)    BUN 36 (*)    Creatinine, Ser 1.46 (*)    ALT 13 (*)    GFR calc non Af Amer 56 (*)    All other components within normal limits  URINALYSIS, ROUTINE W REFLEX MICROSCOPIC (NOT AT [facility]) - Abnormal; Notable for the following:    Protein, ur 30 (*)    All other components within normal limits  CBC - Abnormal; Notable for the following:    RBC 6.12 (*)    MCV 70.3 (*)    MCH 22.7 (*)    RDW 16.5 (*)    All other components within normal limits  BRAIN NATRIURETIC  PEPTIDE - Abnormal; Notable for the following:    B Natriuretic Peptide 534.3 (*)    All other components within normal limits  LIPASE, BLOOD  URINE MICROSCOPIC-ADD ON  Randolm Idol, ED    Imaging Review Dg Abd Acute W/chest  11/09/2014   CLINICAL DATA:  Generalized abdominal pain and nausea.  EXAM: DG ABDOMEN ACUTE W/ 1V CHEST  COMPARISON:  July 01, 2011.  FINDINGS: There is no evidence of dilated bowel loops or free intraperitoneal air. Status post cholecystectomy. No radiopaque calculi or other significant radiographic abnormality is seen. Stable cardiomegaly is noted. Left-sided pacemaker is unchanged in position. No pneumothorax or pleural effusion is noted. Both lungs are clear.  IMPRESSION: No evidence of  bowel obstruction or ileus. No acute cardiopulmonary disease.   Electronically Signed   By: Marijo Conception, M.D.   On: 11/09/2014 08:21     EKG Interpretation   Date/Time:  Friday November 09 2014 07:27:32 EDT Ventricular Rate:  117 PR Interval:    QRS Duration: 91 QT Interval:  357 QTC Calculation: 498 R Axis:   32 Text Interpretation:  Atrial fibrillation Borderline low voltage,  extremity leads Nonspecific T abnormalities, lateral leads Borderline  prolonged QT interval No significant change since last tracing Confirmed  by Noella Kipnis  MD-J, Shawna Kiener ([id number]) on 11/09/2014 7:42:27 AM     Medications  furosemide (LASIX) injection 40 mg (not administered)  carvedilol (COREG) tablet 25 mg (not administered)    MDM   Final diagnoses:  Abdominal bloating  Atrial fibrillation, unspecified    Pt's abdominal exam is benign.  No focal areas of tenderness.  Pt concerned that he was having fluid overload.  Xrays do not show bowel obstruction or pulm edema.    Pt does have chronic a fib.  EKG did show tachycardia.  Some of his symptoms may be related to symptomatic a fib.  Already on antiocoagulant  Slight increase in BNP.  Will give additional dose of lasix and have him increase  his dose of a few days.  Follow up with his cardiologist.  At this time there does not appear to be any evidence of an acute emergency medical condition and the patient appears stable for discharge with appropriate outpatient follow up.     Dorie Rank, MD 11/09/14 1030

## 2014-11-09 NOTE — Discharge Instructions (Signed)

## 2014-11-23 ENCOUNTER — Other Ambulatory Visit: Payer: Self-pay | Admitting: *Deleted

## 2014-11-23 MED ORDER — LISINOPRIL 40 MG PO TABS: 40.0000 mg | ORAL_TABLET | Freq: Every day | ORAL | Status: DC

## 2014-11-26 ENCOUNTER — Other Ambulatory Visit: Payer: Self-pay | Admitting: *Deleted

## 2014-11-26 DIAGNOSIS — I428 Other cardiomyopathies: Secondary | ICD-10-CM

## 2014-11-26 MED ORDER — CARVEDILOL 25 MG PO TABS: 25.0000 mg | ORAL_TABLET | Freq: Two times a day (BID) | ORAL | Status: DC

## 2014-11-30 ENCOUNTER — Encounter (HOSPITAL_COMMUNITY): Payer: Self-pay | Admitting: Emergency Medicine

## 2014-11-30 ENCOUNTER — Emergency Department (HOSPITAL_COMMUNITY): Payer: 59

## 2014-11-30 ENCOUNTER — Inpatient Hospital Stay (HOSPITAL_COMMUNITY)
Admission: EM | Admit: 2014-11-30 | Discharge: 2014-12-05 | DRG: 392 | Disposition: A | Discharge status: Home / Self Care | Payer: 59 | Attending: Student in an Organized Health Care Education/Training Program | Admitting: Student in an Organized Health Care Education/Training Program

## 2014-11-30 DIAGNOSIS — R197 Diarrhea, unspecified: Secondary | ICD-10-CM | POA: Diagnosis not present

## 2014-11-30 DIAGNOSIS — I10 Essential (primary) hypertension: Secondary | ICD-10-CM | POA: Diagnosis present

## 2014-11-30 DIAGNOSIS — I959 Hypotension, unspecified: Secondary | ICD-10-CM | POA: Diagnosis not present

## 2014-11-30 DIAGNOSIS — Z7901 Long term (current) use of anticoagulants: Secondary | ICD-10-CM | POA: Diagnosis not present

## 2014-11-30 DIAGNOSIS — R109 Unspecified abdominal pain: Secondary | ICD-10-CM | POA: Insufficient documentation

## 2014-11-30 DIAGNOSIS — M069 Rheumatoid arthritis, unspecified: Secondary | ICD-10-CM | POA: Diagnosis present

## 2014-11-30 DIAGNOSIS — Z8674 Personal history of sudden cardiac arrest: Secondary | ICD-10-CM

## 2014-11-30 DIAGNOSIS — Z9119 Patient's noncompliance with other medical treatment and regimen: Secondary | ICD-10-CM | POA: Diagnosis present

## 2014-11-30 DIAGNOSIS — A084 Viral intestinal infection, unspecified: Secondary | ICD-10-CM

## 2014-11-30 DIAGNOSIS — E861 Hypovolemia: Secondary | ICD-10-CM | POA: Diagnosis not present

## 2014-11-30 DIAGNOSIS — Z86718 Personal history of other venous thrombosis and embolism: Secondary | ICD-10-CM | POA: Diagnosis not present

## 2014-11-30 DIAGNOSIS — I429 Cardiomyopathy, unspecified: Secondary | ICD-10-CM | POA: Diagnosis present

## 2014-11-30 DIAGNOSIS — I5022 Chronic systolic (congestive) heart failure: Secondary | ICD-10-CM | POA: Diagnosis not present

## 2014-11-30 DIAGNOSIS — I482 Chronic atrial fibrillation: Secondary | ICD-10-CM | POA: Diagnosis not present

## 2014-11-30 DIAGNOSIS — M109 Gout, unspecified: Secondary | ICD-10-CM | POA: Diagnosis present

## 2014-11-30 DIAGNOSIS — K529 Noninfective gastroenteritis and colitis, unspecified: Secondary | ICD-10-CM | POA: Diagnosis not present

## 2014-11-30 DIAGNOSIS — I509 Heart failure, unspecified: Secondary | ICD-10-CM | POA: Diagnosis not present

## 2014-11-30 DIAGNOSIS — N179 Acute kidney failure, unspecified: Secondary | ICD-10-CM | POA: Diagnosis not present

## 2014-11-30 DIAGNOSIS — I9589 Other hypotension: Secondary | ICD-10-CM | POA: Diagnosis not present

## 2014-11-30 DIAGNOSIS — Z95810 Presence of automatic (implantable) cardiac defibrillator: Secondary | ICD-10-CM | POA: Diagnosis not present

## 2014-11-30 DIAGNOSIS — Z8249 Family history of ischemic heart disease and other diseases of the circulatory system: Secondary | ICD-10-CM

## 2014-11-30 DIAGNOSIS — R55 Syncope and collapse: Secondary | ICD-10-CM | POA: Diagnosis present

## 2014-11-30 DIAGNOSIS — R7989 Other specified abnormal findings of blood chemistry: Secondary | ICD-10-CM

## 2014-11-30 DIAGNOSIS — I4891 Unspecified atrial fibrillation: Secondary | ICD-10-CM | POA: Diagnosis present

## 2014-11-30 DIAGNOSIS — Z79899 Other long term (current) drug therapy: Secondary | ICD-10-CM | POA: Diagnosis not present

## 2014-11-30 DIAGNOSIS — I428 Other cardiomyopathies: Secondary | ICD-10-CM

## 2014-11-30 HISTORY — DX: Acute kidney failure, unspecified: N17.9

## 2014-11-30 LAB — COMPREHENSIVE METABOLIC PANEL
ALK PHOS: 60 U/L (ref 38–126)
ALT: 18 U/L (ref 17–63)
ANION GAP: 10 (ref 5–15)
AST: 22 U/L (ref 15–41)
Albumin: 3.5 g/dL (ref 3.5–5.0)
BILIRUBIN TOTAL: 0.9 mg/dL (ref 0.3–1.2)
BUN: 33 mg/dL — AB (ref 6–20)
CALCIUM: 9.1 mg/dL (ref 8.9–10.3)
CHLORIDE: 108 mmol/L (ref 101–111)
CO2: 23 mmol/L (ref 22–32)
Creatinine, Ser: 1.82 mg/dL — ABNORMAL HIGH (ref 0.61–1.24)
GFR calc non Af Amer: 43 mL/min — ABNORMAL LOW (ref >60)
GFR, EST AFRICAN AMERICAN: 50 mL/min — AB (ref >60)
GLUCOSE: 130 mg/dL — AB (ref 65–99)
Potassium: 4.4 mmol/L (ref 3.5–5.1)
Sodium: 141 mmol/L (ref 135–145)
Total Protein: 7.1 g/dL (ref 6.5–8.1)

## 2014-11-30 LAB — URINALYSIS W MICROSCOPIC (NOT AT ARMC)
Bilirubin Urine: NEGATIVE
Glucose, UA: NEGATIVE mg/dL
Hgb urine dipstick: NEGATIVE
Ketones, ur: NEGATIVE mg/dL
LEUKOCYTES UA: NEGATIVE
NITRITE: NEGATIVE
PH: 5.5 (ref 5.0–8.0)
Protein, ur: NEGATIVE mg/dL
Specific Gravity, Urine: 1.031 — ABNORMAL HIGH (ref 1.005–1.030)
Urobilinogen, UA: 0.2 mg/dL (ref 0.0–1.0)

## 2014-11-30 LAB — CBC WITH DIFFERENTIAL/PLATELET
Basophils Absolute: 0.1 10*3/uL (ref 0.0–0.1)
Basophils Relative: 1 % (ref 0–1)
EOS PCT: 0 % (ref 0–5)
Eosinophils Absolute: 0 10*3/uL (ref 0.0–0.7)
HCT: 44.1 % (ref 39.0–52.0)
Hemoglobin: 13.9 g/dL (ref 13.0–17.0)
LYMPHS PCT: 35 % (ref 12–46)
Lymphs Abs: 2.4 10*3/uL (ref 0.7–4.0)
MCH: 21.9 pg — AB (ref 26.0–34.0)
MCHC: 31.5 g/dL (ref 30.0–36.0)
MCV: 69.4 fL — ABNORMAL LOW (ref 78.0–100.0)
MONO ABS: 0.5 10*3/uL (ref 0.1–1.0)
Monocytes Relative: 7 % (ref 3–12)
NEUTROS PCT: 57 % (ref 43–77)
Neutro Abs: 3.9 10*3/uL (ref 1.7–7.7)
PLATELETS: 168 10*3/uL (ref 150–400)
RBC: 6.35 MIL/uL — AB (ref 4.22–5.81)
RDW: 16.9 % — ABNORMAL HIGH (ref 11.5–15.5)
WBC: 6.9 10*3/uL (ref 4.0–10.5)

## 2014-11-30 LAB — ABO/RH: ABO/RH(D): O POS

## 2014-11-30 LAB — I-STAT CHEM 8, ED
BUN: 43 mg/dL — ABNORMAL HIGH (ref 6–20)
Calcium, Ion: 1.15 mmol/L (ref 1.12–1.23)
Chloride: 107 mmol/L (ref 101–111)
Creatinine, Ser: 1.7 mg/dL — ABNORMAL HIGH (ref 0.61–1.24)
GLUCOSE: 122 mg/dL — AB (ref 65–99)
HEMATOCRIT: 49 % (ref 39.0–52.0)
HEMOGLOBIN: 16.7 g/dL (ref 13.0–17.0)
Potassium: 4.6 mmol/L (ref 3.5–5.1)
Sodium: 140 mmol/L (ref 135–145)
TCO2: 24 mmol/L (ref 0–100)

## 2014-11-30 LAB — I-STAT TROPONIN, ED
TROPONIN I, POC: 0.02 ng/mL (ref 0.00–0.08)
Troponin i, poc: 0.03 ng/mL (ref 0.00–0.08)

## 2014-11-30 LAB — MAGNESIUM: MAGNESIUM: 2 mg/dL (ref 1.7–2.4)

## 2014-11-30 LAB — PROTIME-INR
INR: 1.68 — AB (ref 0.00–1.49)
PROTHROMBIN TIME: 19.8 s — AB (ref 11.6–15.2)

## 2014-11-30 LAB — I-STAT CG4 LACTIC ACID, ED
Lactic Acid, Venous: 2.17 mmol/L (ref 0.5–2.0)
Lactic Acid, Venous: 1.38 mmol/L (ref 0.5–2.0)

## 2014-11-30 LAB — TYPE AND SCREEN
ABO/RH(D): O POS
Antibody Screen: NEGATIVE

## 2014-11-30 LAB — APTT: aPTT: 32 seconds (ref 24–37)

## 2014-11-30 LAB — PHOSPHORUS: PHOSPHORUS: 5 mg/dL — AB (ref 2.5–4.6)

## 2014-11-30 LAB — MRSA PCR SCREENING: MRSA by PCR: NEGATIVE

## 2014-11-30 LAB — BRAIN NATRIURETIC PEPTIDE: B NATRIURETIC PEPTIDE 5: 857.9 pg/mL — AB (ref 0.0–100.0)

## 2014-11-30 MED ORDER — SODIUM CHLORIDE 0.9 % IV SOLN
Freq: Once | INTRAVENOUS | Status: AC
  Administered 2014-11-30: 17:00:00 via INTRAVENOUS

## 2014-11-30 MED ORDER — SODIUM CHLORIDE 0.9 % IV BOLUS (SEPSIS)
250.0000 mL | Freq: Once | INTRAVENOUS | Status: AC
  Administered 2014-11-30: 250 mL via INTRAVENOUS

## 2014-11-30 MED ORDER — ACETAMINOPHEN 650 MG RE SUPP
650.0000 mg | Freq: Four times a day (QID) | RECTAL | Status: DC | PRN
Start: 2014-11-30 — End: 2014-12-05

## 2014-11-30 MED ORDER — SODIUM CHLORIDE 0.9 % IV BOLUS (SEPSIS)
500.0000 mL | Freq: Once | INTRAVENOUS | Status: AC
  Administered 2014-11-30: 500 mL via INTRAVENOUS

## 2014-11-30 MED ORDER — SODIUM CHLORIDE 0.9 % IJ SOLN
3.0000 mL | Freq: Two times a day (BID) | INTRAMUSCULAR | Status: DC
  Administered 2014-11-30 – 2014-12-05 (×10): 3 mL via INTRAVENOUS

## 2014-11-30 MED ORDER — CARVEDILOL 25 MG PO TABS
25.0000 mg | ORAL_TABLET | Freq: Two times a day (BID) | ORAL | Status: DC
  Administered 2014-12-01 – 2014-12-05 (×8): 25 mg via ORAL
  Filled 2014-11-30 (×12): qty 1

## 2014-11-30 MED ORDER — APIXABAN 5 MG PO TABS
5.0000 mg | ORAL_TABLET | Freq: Two times a day (BID) | ORAL | Status: DC
  Administered 2014-11-30 – 2014-12-05 (×10): 5 mg via ORAL
  Filled 2014-11-30 (×12): qty 1

## 2014-11-30 MED ORDER — SODIUM CHLORIDE 0.9 % IV SOLN: Freq: Once | INTRAVENOUS | Status: DC

## 2014-11-30 MED ORDER — ACETAMINOPHEN 325 MG PO TABS
650.0000 mg | ORAL_TABLET | Freq: Four times a day (QID) | ORAL | Status: DC | PRN
  Administered 2014-12-01 – 2014-12-04 (×2): 650 mg via ORAL
  Filled 2014-11-30 (×2): qty 2

## 2014-11-30 MED ORDER — IOHEXOL 350 MG/ML SOLN
100.0000 mL | Freq: Once | INTRAVENOUS | Status: AC | PRN
  Administered 2014-11-30: 100 mL via INTRAVENOUS

## 2014-11-30 NOTE — Progress Notes (Signed)
[name] is a 44 y.o. male patient admitted from ED awake, alert - oriented  X 3 - no acute distress noted.  VSS - Blood pressure 88/74, pulse 61, temperature 98.3 F (36.8 C), temperature source Oral, resp. rate 17, height 5\' 7"  (1.702 m), weight 83 kg (182 lb 15.7 oz), SpO2 98 %.    IV in place, occlusive dsg intact without redness.  Orientation to room, and floor completed with information packet given to patient/family.  Patient declined safety video at this time.  Admission INP armband ID verified with patient/family, and in place.   SR up x 2, fall assessment complete, with patient and family able to verbalize understanding of risk associated with falls, and verbalized understanding to call nsg before up out of bed.  Call light within reach, patient able to voice, and demonstrate understanding.  Skin, clean-dry- intact without evidence of bruising, or skin tears.   No evidence of skin break down noted on exam.     Will cont to eval and treat per MD orders.  Tenika Keeran, Helen Hashimoto, RN 11/30/2014 6:57 PM

## 2014-11-30 NOTE — ED Notes (Signed)
Dr. Oleta Mouse advised to give patient another 569ml bolus for his low blood pressure.

## 2014-11-30 NOTE — ED Notes (Addendum)
Pt arrives via [facility]. Pt was at work, felt nauseated, diaphoretic, stood up and fell to the ground. No loc. Upon ems arrival, pts blood pressure 77/56. Pt received 4mg  zofran. Denies pain or nausea at this time. Pt alert, oriented. Pt has defibrillator in place, reports that he did not feel a shock from it.

## 2014-11-30 NOTE — ED Notes (Signed)
MD at bedside. 

## 2014-11-30 NOTE — Progress Notes (Addendum)
NIGHT FLOAT NOTE:  Subjective:  Asked by primary team to see patient this evening. Appears comfortable, in no acute distress. Slightly tachycardic into the low 100's, BP read as 70's/50's. Patient states he is not having any significant symptoms of hypotension however, does state he feels slightly dizzy on standing. Patient reports having a few large watery bowel movements over the past few days. Admits to having an appetite, wants to eat something.   Physical Exam: General: AA male, alert, cooperative, NAD. HEENT: PERRL, EOMI. Moist mucus membranes Neck: Full range of motion without pain, supple, no lymphadenopathy or carotid bruits. No obvious JVD.  Lungs: Clear to ascultation bilaterally, normal work of respiration, no wheezes, rales, rhonchi Heart: Mild tachycardia, irregular, no murmurs, gallops, or rubs Abdomen: Soft, non-tender, non-distended, BS + Extremities: No cyanosis, clubbing, or edema Neurologic: Alert & oriented x3, cranial nerves II-XII intact, strength grossly intact, sensation intact to light touch  Assessment/Plan: 44 y/o w/ PMHx of NICM, most recent ECHO 10/2012 w/ EF of 25-30%, admitted w/ hypotension most likely 2/2 hypovolemia related to diarrhea.   Hypotension: BP still 70's/50's on exam, mild tachycardia w/ atrial fibrillation/flutter. Low BP most likely related to hypovolemia, responded well to IVF earlier. Severe NICM noted. CT angio of the chest showed dilated IVC and hepatic venous congestion suggestive of increased CVP, therefore an element of decreased cardiac output also somewhat possible. Lungs sound quite clear on ausculation, CT chest w/ no significant pulmonary edema. No LE edema, no obvious JVD on exam. Most recent lactic acid 1.38.  -Give another 250 cc bolus now  -[employer] -Although somewhat contradictory, will continue Coreg for rate control (previously on Amiodarone, stopped 2/2 increased LFT's).  -Troponins  Atrial Fibrillation: Rate currently ~100,  in a-fib/flutter.  -Coreg as above  NICM: Unclear etiology. Patient is quite young. Per chart review, do not see any clear cause of NICM. Does have a h/o RA per notes, was previously on Methotrexate + Prednisone at one point for this, as far back as 2012. Some question of autoimmune? Noted to have severe TR and increased PAP on previous ECHO. CT chest w/ no obvious pulmonary findings.  -Gentle IV hydration as bove -AM cortisol in AM per primary team.  -Troponins, TSH pending -Repeat ECHO in AM   Natasha Bence, MD PGY-3, Internal Medicine Pager: [phone]

## 2014-11-30 NOTE — ED Provider Notes (Signed)
CSN: [id number]     Arrival date & time 11/30/14  1318 History   First MD Initiated Contact with Patient 11/30/14 1336     Chief Complaint  Patient presents with  . Hypotension     (Consider location/radiation/quality/duration/timing/severity/associated sxs/prior Treatment) HPI 44 year old male who presents with hypotension and near syncope. He is a history of nonischemic cardiomyopathy with AICD in EF of 25%. He reports being in his usual state of health, and this morning while getting up from a seated position noted generalized abdominal discomfort primarily in the epigastric and periumbilical region. This is accompanied by mild abdominal distention. He began to feel unwell, diaphoretic, and lightheaded. He felt as if he was about to pass out, but lowered himself to the ground. He did not have any preceding palpitations, chest pain, difficulty breathing, or AICD firing. EMS was subsequently called, and on their arrival he was hypotensive with systolic blood pressures in the 60s. He received 300 mL of IV fluids and brought to ED for evaluation.  On arrival, he denies any significant abdominal pain, but does note pressure within his epigastric region. He denies any recent fevers, chills, cough, urinary complaints, vomiting, diarrhea. No sick contacts. Denies melena or hematochezia. He reports taking multiple blood pressure medications, but has not accidentally taken more than needed or taking them in a different order than usual. Reports pain in his dry weight and has not had any increasing dyspnea on exertion, lower extremity edema, orthopnea, or PND.  Past Medical History  Diagnosis Date  . Chronic systolic heart failure     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%)  . Atrial fibrillation -parosysmal      Rx w amiodarone  . Noncompliance     H/O  MEDICAL NONCOMPLIANCE  . 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 Tricuspid valve regurgitation      SEVERE  . Severe mitral regurgitation   . Polymorphic ventricular tachycardia     RECURRENT WITH APPROPRIATE SHOCK THERAPY IN THE PAST  . Ventricular fibrillation     WITH APPROPRIATE SHOCK THERAPY IN THE PAST  . Hypertension   . Gout   . RA (rheumatoid arthritis)   . Automatic implantable cardiac defibrillator -BSX     single chamber  . GI bleed -massive     11 Units 2012  . Elevated LFTs   . H/O hyperthyroidism   . CHF (congestive heart failure)    Past Surgical History  Procedure Laterality Date  . Cardiac catheterization  06/2006    RIGHT HEART CATH SHOWING SEVERE BIVENTRICUALR CHF WITH MARKED FILLING AND PRESSURES  . Insert / replace / remove pacemaker      GUIDANT HE ICD MODEL 2180, SERIAL # [id number]  . Cholecystectomy    . Implantable cardioverter defibrillator generator change N/A 07/01/2011    Procedure: IMPLANTABLE CARDIOVERTER DEFIBRILLATOR GENERATOR CHANGE;  Surgeon: Deboraha Sprang, MD;  Location: [facility] CATH LAB;  Service: Cardiovascular;  Laterality: N/A;   Family History  Problem Relation Age of Onset  . Heart failure Brother    Social History  Substance Use Topics  . Smoking status: Never Smoker   . Smokeless tobacco: Never Used  . Alcohol Use: No    Review of Systems 10/14 systems reviewed and are negative other than those stated in the HPI  Allergies  Review of patient's allergies indicates no known allergies.  Home Medications   Prior to Admission medications   Medication Sig Start Date  End Date Taking? Authorizing Provider  allopurinol (ZYLOPRIM) 100 MG tablet Take 100 mg by mouth daily. 10/31/14   Historical Provider, MD  apixaban (ELIQUIS) 5 MG TABS tablet Take 1 tablet (5 mg total) by mouth 2 (two) times daily. 08/24/14   Deboraha Sprang, MD  carvedilol (COREG) 25 MG tablet Take 1 tablet (25 mg total) by mouth 2 (two) times daily with a meal. 11/26/14   Deboraha Sprang, MD  furosemide (LASIX) 40 MG tablet Take 1 tablet (40 mg total) by mouth daily.  01/29/14   Evans Lance, MD  lisinopril (PRINIVIL,ZESTRIL) 40 MG tablet Take 1 tablet (40 mg total) by mouth daily. 11/23/14   Deboraha Sprang, MD  spironolactone (ALDACTONE) 25 MG tablet Take 1 tablet (25 mg total) by mouth daily. 07/25/14   Deboraha Sprang, MD   BP 93/78 mmHg  Pulse 102  Temp(Src) 97.9 F (36.6 C) (Oral)  Resp 18  SpO2 97% Physical Exam Physical Exam  Nursing note and vitals reviewed. Constitutional: Well developed, well nourished, non-toxic, and in no acute distress Head: Normocephalic and atraumatic.  Mouth/Throat: Oropharynx is clear and moist.  Neck: Normal range of motion. Neck supple.  Cardiovascular: Tachycardic rate and regular rhythm.  No edema. No significant JVD Pulmonary/Chest: Effort normal and breath sounds normal.  Abdominal: Soft. There is some discomfort in the epigastrium to palpation. There is no rebound and no guarding.  Musculoskeletal: Normal range of motion.  Neurological: Alert, no facial droop, fluent speech, moves all extremities symmetrically Skin: Skin is warm and dry.  Psychiatric: Cooperative  ED Course  Procedures (including critical care time) Labs Review Labs Reviewed  CBC WITH DIFFERENTIAL/PLATELET - Abnormal; Notable for the following:    RBC 6.35 (*)    MCV 69.4 (*)    MCH 21.9 (*)    RDW 16.9 (*)    All other components within normal limits  COMPREHENSIVE METABOLIC PANEL - Abnormal; Notable for the following:    Glucose, Bld 130 (*)    BUN 33 (*)    Creatinine, Ser 1.82 (*)    GFR calc non Af Amer 43 (*)    GFR calc Af Amer 50 (*)    All other components within normal limits  PROTIME-INR - Abnormal; Notable for the following:    Prothrombin Time 19.8 (*)    INR 1.68 (*)    All other components within normal limits  BRAIN NATRIURETIC PEPTIDE - Abnormal; Notable for the following:    B Natriuretic Peptide 857.9 (*)    All other components within normal limits  PHOSPHORUS - Abnormal; Notable for the following:     Phosphorus 5.0 (*)    All other components within normal limits  I-STAT CHEM 8, ED - Abnormal; Notable for the following:    BUN 43 (*)    Creatinine, Ser 1.70 (*)    Glucose, Bld 122 (*)    All other components within normal limits  I-STAT CG4 LACTIC ACID, ED - Abnormal; Notable for the following:    Lactic Acid, Venous 2.17 (*)    All other components within normal limits  URINE CULTURE  APTT  MAGNESIUM  URINALYSIS W MICROSCOPIC  I-STAT TROPOININ, ED  I-STAT TROPOININ, ED  I-STAT CG4 LACTIC ACID, ED  I-STAT CG4 LACTIC ACID, ED  TYPE AND SCREEN    Imaging Review Ct Angio Chest Aorta W/cm &/or Wo/cm  11/30/2014   CLINICAL DATA:  Syncope, abdominal pain.  EXAM: CT ANGIOGRAPHY CHEST, ABDOMEN AND PELVIS  TECHNIQUE: Multidetector CT imaging through the chest, abdomen and pelvis was performed using the standard protocol during bolus administration of intravenous contrast. Multiplanar reconstructed images and MIPs were obtained and reviewed to evaluate the vascular anatomy.  CONTRAST:  180 mL OMNIPAQUE IOHEXOL 350 MG/ML SOLN ; patient had to be rescanned due to timing issue on original scan.  COMPARISON:  None.  FINDINGS: CTA CHEST FINDINGS  No pneumothorax or pleural effusion is noted. Cardiomegaly is noted. No acute pulmonary disease is noted. Probable minimal scarring is noted posteriorly in both lung bases. There is no evidence of thoracic aortic dissection or aneurysm. No definite evidence of pulmonary embolus is noted. Great vessels are widely patent. No significant osseous abnormality is noted. Left-sided pacemaker is noted with lead in grossly good position in the right ventricle. No mediastinal mass or adenopathy is noted.  Review of the MIP images confirms the above findings.  CTA ABDOMEN AND PELVIS FINDINGS  No focal abnormality is noted in the liver, spleen or pancreas. Hepatic veins are significantly enlarged as well as the more superior aspect of the IVC which may be cardiac in  etiology. Adrenal glands and kidneys appear normal. No hydronephrosis or renal obstruction is noted. The appendix appears normal. There is no evidence of bowel obstruction or ileus. No abnormal fluid collection is noted. Urinary bladder appears normal. No significant adenopathy is noted. Status post cholecystectomy.  There is no evidence of abdominal aortic aneurysm or dissection. Mesenteric and renal arteries are widely patent without significant stenosis. Iliac arteries are widely patent without significant stenosis.  Review of the MIP images confirms the above findings.  IMPRESSION: There is no evidence of thoracic or abdominal aortic dissection or aneurysm.  There is no evidence of pulmonary embolus.  Moderate cardiomegaly is noted. Significantly enlarged hepatic veins are noted suggesting elevated venous pressures most likely cardiac in origin.   Electronically Signed   By: Marijo Conception, M.D.   On: 11/30/2014 14:59   Ct Cta Abd/pel W/cm &/or W/o Cm  11/30/2014   CLINICAL DATA:  Syncope, abdominal pain.  EXAM: CT ANGIOGRAPHY CHEST, ABDOMEN AND PELVIS  TECHNIQUE: Multidetector CT imaging through the chest, abdomen and pelvis was performed using the standard protocol during bolus administration of intravenous contrast. Multiplanar reconstructed images and MIPs were obtained and reviewed to evaluate the vascular anatomy.  CONTRAST:  180 mL OMNIPAQUE IOHEXOL 350 MG/ML SOLN ; patient had to be rescanned due to timing issue on original scan.  COMPARISON:  None.  FINDINGS: CTA CHEST FINDINGS  No pneumothorax or pleural effusion is noted. Cardiomegaly is noted. No acute pulmonary disease is noted. Probable minimal scarring is noted posteriorly in both lung bases. There is no evidence of thoracic aortic dissection or aneurysm. No definite evidence of pulmonary embolus is noted. Great vessels are widely patent. No significant osseous abnormality is noted. Left-sided pacemaker is noted with lead in grossly good  position in the right ventricle. No mediastinal mass or adenopathy is noted.  Review of the MIP images confirms the above findings.  CTA ABDOMEN AND PELVIS FINDINGS  No focal abnormality is noted in the liver, spleen or pancreas. Hepatic veins are significantly enlarged as well as the more superior aspect of the IVC which may be cardiac in etiology. Adrenal glands and kidneys appear normal. No hydronephrosis or renal obstruction is noted. The appendix appears normal. There is no evidence of bowel obstruction or ileus. No abnormal fluid collection is noted. Urinary bladder appears normal. No significant adenopathy is  noted. Status post cholecystectomy.  There is no evidence of abdominal aortic aneurysm or dissection. Mesenteric and renal arteries are widely patent without significant stenosis. Iliac arteries are widely patent without significant stenosis.  Review of the MIP images confirms the above findings.  IMPRESSION: There is no evidence of thoracic or abdominal aortic dissection or aneurysm.  There is no evidence of pulmonary embolus.  Moderate cardiomegaly is noted. Significantly enlarged hepatic veins are noted suggesting elevated venous pressures most likely cardiac in origin.   Electronically Signed   By: Marijo Conception, M.D.   On: 11/30/2014 14:59   I, Forde Dandy, personally reviewed and evaluated these images and lab results as part of my medical decision-making.   EKG Interpretation   Date/Time:  Friday November 30 2014 13:21:25 EDT Ventricular Rate:  120 PR Interval:    QRS Duration: 89 QT Interval:  380 QTC Calculation: 537 R Axis:   11 Text Interpretation:  Atrial fibrillation Low voltage, extremity leads  Nonspecific T abnormalities, lateral leads Prolonged QT interval No  significant change from prior Confirmed by Alexsis Branscom MD, Tiara Maultsby [id number]) on  11/30/2014 3:04:31 PM      MDM   Final diagnoses:  Elevated lactic acid level  Hypotension, unspecified hypotension type  AKI (acute  kidney injury)  Nonischemic cardiomyopathy  AICD (automatic cardioverter/defibrillator) present   In short, this is a 44 year old male with history of nonischemic cardiomyopathy with prior history of DVT status post AICD who presents with hypotension and near syncope. He on presentation is nontoxic and in no acute distress. He is awake, alert, and answering questions appropriately. He is noted to initially be mildly tachycardic and hypotensive with systolic blood pressures in the 60s, which is also confirmed by manual blood pressure. He is afebrile and breathing comfortably on room air in no respiratory distress. He is a soft and benign abdomen, with just mild reported discomfort within the epigastrium. Remainder of exam including cardiopulmonary exam is unremarkable. Bedside ultrasound was performed revealing no evidence of free intra-abdominal fluid, aorta was poorly visualized. He is initially given 500 mL of IV fluids in the setting of his cardiomyopathy, and does respond with improvement in his systolic blood pressure to the 80 systolic initially. Given concern for aortic pathology, a CTA of his chest abdomen and pelvis is performed. This is visualized, and he is not noted to have significant intra-abdominal or intrathoracic pathology to explain his hypotension.  In regards to his blood work, he has mildly elevated lactate at 2.14 and mild AKI of with creatinine of 1.8. His baseline is 1.4. First troponin is negative and EKG is non-ischemic. He is not immunosuppressed and no evidence of significant leukocytosis. History is not consistent with that of sepsis as he is no evidence of infection by history or workup. No history of GI bleed and rectal exam is negative, noted to have stable hemoglobin, and unlikely to be the etiology of his symptoms. Low suspicion for any medication overdose or any toxicological etiology of his symptoms.  At this time, he had continues to [facility] at his baseline, and is able  to produce urine. He does transiently responded to IV fluids, and is given an additional 500 mL of IV fluids with improvement in his blood pressure from the 80s to 100s. I did discuss this patient with the intensivist, who feel that overall he does not require ICU admission.  He was subsequently discussed with Dr. Heber [place] from the internal medicine service, who is admitted to stepdown  for ongoing management.   CRITICAL CARE Performed by: Forde Dandy   Total critical care time: 45 minutes  Critical care time was exclusive of separately billable procedures and treating other patients.  Critical care was necessary to treat or prevent imminent or life-threatening deterioration.  Critical care was time spent personally by me on the following activities: development of treatment plan with patient and/or surrogate as well as nursing, discussions with consultants, evaluation of patient's response to treatment, examination of patient, obtaining history from patient or surrogate, ordering and performing treatments and interventions, ordering and review of laboratory studies, ordering and review of radiographic studies, pulse oximetry and re-evaluation of patient's condition.    Forde Dandy, MD 11/30/14 1640

## 2014-11-30 NOTE — ED Notes (Signed)
Patient transported to CT 

## 2014-11-30 NOTE — H&P (Signed)
Date: 11/30/2014               Patient Name:  [name] MRN: [id number]  DOB: [birth year] Age / Sex: 44 y.o., male   PCP: Sandi Mariscal, MD         Medical Service: Internal Medicine Teaching Service         Attending Physician: Dr. Oval Linsey, MD    First Contact: Dr. Burgess Estelle Pager: [phone]  Second Contact: Dr. Joni Reining Pager: [phone]       After Hours (After 5p/  First Contact Pager: [phone]  weekends / holidays): Second Contact Pager: [phone]   Chief Complaint: near syncope and hypotension  History of Present Illness:  44 yo man with hx of NICm s/p ICD placement in 1999 with EF of 20-25% last checked in 2014, afib, HTN, and prior DVT history who came in the ER with  Hypotension and near syncope.  He says that he was feeling fine until this morning when he got up from a seated position and felt lightheaded and diaphoretic with some abdominal discomfort and distension. There was no LOC, postictal confusion or other red flags. He did not feel any palpitations, prodrome, and his ICD did not fire, and no chest pain previously. EMS was called and then he was brought to the ER.   No recent fevers, n/v/ but reports some diarrhea. Denies melena or hematochezia. No increased dyspnea on exertion, lower extremity edema, orthopnea or PND. No change in weight. There have been no changes in the meds.   On exam In the ER, he was found to be hypotensive with BPS in the 90s/50s, tachycardic, and in afib with irregularly irregular rhythm and was receiving fluid bolus of NS. CTA was ordered given concern for aortic pathology, troponins were trended and and 1st one negative and his lactic acid was mildly elevated. EKG showed afib.  Meds:   Medication List    ASK your doctor about these medications        allopurinol 100 MG tablet  Commonly known as:  ZYLOPRIM  Take 100 mg by mouth daily.     apixaban 5 MG Tabs tablet  Commonly known as:  ELIQUIS  Take 1 tablet (5 mg total) by  mouth 2 (two) times daily.     carvedilol 25 MG tablet  Commonly known as:  COREG  Take 1 tablet (25 mg total) by mouth 2 (two) times daily with a meal.     furosemide 40 MG tablet  Commonly known as:  LASIX  Take 1 tablet (40 mg total) by mouth daily.     lisinopril 40 MG tablet  Commonly known as:  PRINIVIL,ZESTRIL  Take 1 tablet (40 mg total) by mouth daily.     spironolactone 25 MG tablet  Commonly known as:  ALDACTONE  Take 1 tablet (25 mg total) by mouth daily.        Current Facility-Administered Medications  Medication Dose Route Frequency Provider Last Rate Last Dose  . acetaminophen (TYLENOL) tablet 650 mg  650 mg Oral Q6H PRN Lucious Groves, DO       Or  . acetaminophen (TYLENOL) suppository 650 mg  650 mg Rectal Q6H PRN Lucious Groves, DO      . apixaban (ELIQUIS) tablet 5 mg  5 mg Oral BID Lucious Groves, DO      . [START ON 12/01/2014] carvedilol (COREG) tablet 25 mg  25 mg Oral BID WC Lucious Groves, DO      .  sodium chloride 0.9 % injection 3 mL  3 mL Intravenous Q12H Lucious Groves, DO        Allergies: Allergies as of 11/30/2014  . (No Known Allergies)   Past Medical History  Diagnosis Date  . Chronic systolic heart failure     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%)  . Atrial fibrillation -parosysmal      Rx w amiodarone  . Noncompliance     H/O  MEDICAL NONCOMPLIANCE  . 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 Tricuspid valve regurgitation     SEVERE  . Severe mitral regurgitation   . Polymorphic ventricular tachycardia     RECURRENT WITH APPROPRIATE SHOCK THERAPY IN THE PAST  . Ventricular fibrillation     WITH APPROPRIATE SHOCK THERAPY IN THE PAST  . Hypertension   . Gout   . RA (rheumatoid arthritis)   . Automatic implantable cardiac defibrillator -BSX     single chamber  . GI bleed -massive     11 Units 2012  . Elevated LFTs   . H/O hyperthyroidism   . CHF (congestive heart failure)      Past Surgical History  Procedure Laterality Date  . Cardiac catheterization  06/2006    RIGHT HEART CATH SHOWING SEVERE BIVENTRICUALR CHF WITH MARKED FILLING AND PRESSURES  . Insert / replace / remove pacemaker      GUIDANT HE ICD MODEL 2180, SERIAL # [id number]  . Cholecystectomy    . Implantable cardioverter defibrillator generator change N/A 07/01/2011    Procedure: IMPLANTABLE CARDIOVERTER DEFIBRILLATOR GENERATOR CHANGE;  Surgeon: Deboraha Sprang, MD;  Location: [facility] CATH LAB;  Service: Cardiovascular;  Laterality: N/A;   Family History  Problem Relation Age of Onset  . Heart failure Brother    Social History   Social History  . Marital Status: Single    Spouse Name: N/A  . Number of Children: N/A  . Years of Education: N/A   Occupational History  . [employer]   Social History Main Topics  . Smoking status: Never Smoker   . Smokeless tobacco: Never Used  . Alcohol Use: No  . Drug Use: No  . Sexual Activity: Not on file   Other Topics Concern  . Not on file   Social History Narrative   LIVES IN [place]   SINGLE   TOBACCO USE .Marland KitchenMarland KitchenNO   ALCOHOL USE.Marland Kitchen NO   REGULAR EXERCISE.Marland Kitchen NO   DRUG USE... NO    Review of Systems:   Physical Exam: Blood pressure 88/74, pulse 61, temperature 98.3 F (36.8 C), temperature source Oral, resp. rate 17, height 5\' 7"  (1.702 m), weight 182 lb 15.7 oz (83 kg), SpO2 98 %. General: A&O, in NAD HEENT: EOMI, PERRLA, sclera white, conjunctiva pink, no ear/nose/throat erythema, moist mucous membranes Neck: supple, midline trachea, no cervical LAD, thyroid nonpalpable CV: irregularly irregular rhythym, tachycardia, has left ICD placed and healed scar, no carotid bruits appreciated, no JVD appreciated Resp: equal and symmetric breath sounds, no wheezing heard Abdomen: soft, nontender, nondistended, +BS in all 4 quadrants, no bruits appreciated, no masses, no HSM GU: no CVA tenderness Skin: warm, dry, intact, no open lesions or  rashes noted Extremities: pulses intact b/l, no edema, clubbing or cyanosis, no calf tenderness Neurologic: no focal deficits   Lab results: @LABTEST @ CBC Latest Ref Rng 11/30/2014 11/30/2014 11/09/2014  WBC 4.0 - 10.5 K/uL - 6.9 9.9  Hemoglobin 13.0 - 17.0 g/dL 16.7  13.9 13.9  Hematocrit 39.0 - 52.0 % 49.0 44.1 43.0  Platelets 150 - 400 K/uL - 168 229    BMP Latest Ref Rng 11/30/2014 11/30/2014 11/09/2014  Glucose 65 - 99 mg/dL 122(H) 130(H) 107(H)  BUN 6 - 20 mg/dL 43(H) 33(H) 36(H)  Creatinine 0.61 - 1.24 mg/dL 1.70(H) 1.82(H) 1.46(H)  Sodium 135 - 145 mmol/L 140 141 140  Potassium 3.5 - 5.1 mmol/L 4.6 4.4 4.2  Chloride 101 - 111 mmol/L 107 108 107  CO2 22 - 32 mmol/L - 23 23  Calcium 8.9 - 10.3 mg/dL - 9.1 9.1    Troponin (Point of Care Test)  Recent Labs  11/30/14 1658  TROPIPOC 0.02    Imaging results:  Ct Angio Chest Aorta W/cm &/or Wo/cm  11/30/2014   CLINICAL DATA:  Syncope, abdominal pain.  EXAM: CT ANGIOGRAPHY CHEST, ABDOMEN AND PELVIS  TECHNIQUE: Multidetector CT imaging through the chest, abdomen and pelvis was performed using the standard protocol during bolus administration of intravenous contrast. Multiplanar reconstructed images and MIPs were obtained and reviewed to evaluate the vascular anatomy.  CONTRAST:  180 mL OMNIPAQUE IOHEXOL 350 MG/ML SOLN ; patient had to be rescanned due to timing issue on original scan.  COMPARISON:  None.  FINDINGS: CTA CHEST FINDINGS  No pneumothorax or pleural effusion is noted. Cardiomegaly is noted. No acute pulmonary disease is noted. Probable minimal scarring is noted posteriorly in both lung bases. There is no evidence of thoracic aortic dissection or aneurysm. No definite evidence of pulmonary embolus is noted. Great vessels are widely patent. No significant osseous abnormality is noted. Left-sided pacemaker is noted with lead in grossly good position in the right ventricle. No mediastinal mass or adenopathy is noted.  Review of the  MIP images confirms the above findings.  CTA ABDOMEN AND PELVIS FINDINGS  No focal abnormality is noted in the liver, spleen or pancreas. Hepatic veins are significantly enlarged as well as the more superior aspect of the IVC which may be cardiac in etiology. Adrenal glands and kidneys appear normal. No hydronephrosis or renal obstruction is noted. The appendix appears normal. There is no evidence of bowel obstruction or ileus. No abnormal fluid collection is noted. Urinary bladder appears normal. No significant adenopathy is noted. Status post cholecystectomy.  There is no evidence of abdominal aortic aneurysm or dissection. Mesenteric and renal arteries are widely patent without significant stenosis. Iliac arteries are widely patent without significant stenosis.  Review of the MIP images confirms the above findings.  IMPRESSION: There is no evidence of thoracic or abdominal aortic dissection or aneurysm.  There is no evidence of pulmonary embolus.  Moderate cardiomegaly is noted. Significantly enlarged hepatic veins are noted suggesting elevated venous pressures most likely cardiac in origin.   Electronically Signed   By: Marijo Conception, M.D.   On: 11/30/2014 14:59   Ct Cta Abd/pel W/cm &/or W/o Cm  11/30/2014   CLINICAL DATA:  Syncope, abdominal pain.  EXAM: CT ANGIOGRAPHY CHEST, ABDOMEN AND PELVIS  TECHNIQUE: Multidetector CT imaging through the chest, abdomen and pelvis was performed using the standard protocol during bolus administration of intravenous contrast. Multiplanar reconstructed images and MIPs were obtained and reviewed to evaluate the vascular anatomy.  CONTRAST:  180 mL OMNIPAQUE IOHEXOL 350 MG/ML SOLN ; patient had to be rescanned due to timing issue on original scan.  COMPARISON:  None.  FINDINGS: CTA CHEST FINDINGS  No pneumothorax or pleural effusion is noted. Cardiomegaly is noted. No acute pulmonary disease is  noted. Probable minimal scarring is noted posteriorly in both lung bases. There  is no evidence of thoracic aortic dissection or aneurysm. No definite evidence of pulmonary embolus is noted. Great vessels are widely patent. No significant osseous abnormality is noted. Left-sided pacemaker is noted with lead in grossly good position in the right ventricle. No mediastinal mass or adenopathy is noted.  Review of the MIP images confirms the above findings.  CTA ABDOMEN AND PELVIS FINDINGS  No focal abnormality is noted in the liver, spleen or pancreas. Hepatic veins are significantly enlarged as well as the more superior aspect of the IVC which may be cardiac in etiology. Adrenal glands and kidneys appear normal. No hydronephrosis or renal obstruction is noted. The appendix appears normal. There is no evidence of bowel obstruction or ileus. No abnormal fluid collection is noted. Urinary bladder appears normal. No significant adenopathy is noted. Status post cholecystectomy.  There is no evidence of abdominal aortic aneurysm or dissection. Mesenteric and renal arteries are widely patent without significant stenosis. Iliac arteries are widely patent without significant stenosis.  Review of the MIP images confirms the above findings.  IMPRESSION: There is no evidence of thoracic or abdominal aortic dissection or aneurysm.  There is no evidence of pulmonary embolus.  Moderate cardiomegaly is noted. Significantly enlarged hepatic veins are noted suggesting elevated venous pressures most likely cardiac in origin.   Electronically Signed   By: Marijo Conception, M.D.   On: 11/30/2014 14:59    Other results: EKG: a-fib  Assessment & Plan by Problem: Principal Problem:   Hypotension Differentials include Infectious, cardiogenic, metabolic, obstructive, adrenal  No signs of infection- no fever, leuks, pan-scanned, UA is normal , do not suspect septic shock  Obstructive- PE or cardiac tamponade. But CT of chest shows no evidence of PE or no pericardial effusion   Cardiogenic- he does have h/o sCHF  which could lead to low output cardiogenic shock, but his 2nd  lactic acid was not elevated and he appears to be perfusing well, making this less likely. We will check his echo again to re-assess. Dissection was ruled out on CT angio. Do not suspect right sided infarct causing this with lack of any symptom. - but repeat troponin in AM  Hypovolemic:  While he reports diarrhea, he appears euvolemic, ct suggests possible increased CVp with increased portal/hepatic congestion- he did seem to respond some to fluids and very well could have be dehydrated in the setting of his multiple antiHTn.  Polypharmacy- we will hold his antiHTN with exception of coreg.   Rate- continue eliquis, and coreg   Distributive: unlikely to be septic shock as mentioned above  Adrenal: no concerning signs for history, will check Am cortisol  -AM cortisol level -Echo -Received 2 500cc fluid boluses, will hold on further due to CHF -TSh level -Trend troponins - Continue eliquis and coreg, hold other antiHTn meds    Active Problems:   Essential hypertension, benign   Atrial fibrillation   SYSTOLIC HEART FAILURE, CHRONIC   ICD-[employer]   Nonischemic cardiomyopathy     Dispo: Disposition is deferred at this time, awaiting improvement of current medical problems. Anticipated discharge in approximately 1-2 day(s).   The patient does have a current PCP Sandi Mariscal, MD) and does need an [facility] hospital follow-up appointment after discharge.  The patient does not have transportation limitations that hinder transportation to clinic appointments.  Signed: Burgess Estelle, MD 11/30/2014, 7:50 PM

## 2014-12-01 ENCOUNTER — Inpatient Hospital Stay (HOSPITAL_BASED_OUTPATIENT_CLINIC_OR_DEPARTMENT_OTHER): Payer: 59

## 2014-12-01 DIAGNOSIS — I509 Heart failure, unspecified: Secondary | ICD-10-CM

## 2014-12-01 DIAGNOSIS — R55 Syncope and collapse: Secondary | ICD-10-CM | POA: Diagnosis present

## 2014-12-01 LAB — URINE CULTURE: CULTURE: NO GROWTH

## 2014-12-01 LAB — BASIC METABOLIC PANEL
Anion gap: 7 (ref 5–15)
BUN: 38 mg/dL — AB (ref 6–20)
CO2: 20 mmol/L — AB (ref 22–32)
CREATININE: 2.06 mg/dL — AB (ref 0.61–1.24)
Calcium: 8.5 mg/dL — ABNORMAL LOW (ref 8.9–10.3)
Chloride: 107 mmol/L (ref 101–111)
GFR calc Af Amer: 43 mL/min — ABNORMAL LOW (ref >60)
GFR calc non Af Amer: 37 mL/min — ABNORMAL LOW (ref >60)
GLUCOSE: 89 mg/dL (ref 65–99)
Potassium: 4.9 mmol/L (ref 3.5–5.1)
SODIUM: 134 mmol/L — AB (ref 135–145)

## 2014-12-01 LAB — TROPONIN I: Troponin I: <0.03 ng/mL (ref <0.031)

## 2014-12-01 LAB — TSH: TSH: 1.756 u[IU]/mL (ref 0.350–4.500)

## 2014-12-01 LAB — CORTISOL: Cortisol, Plasma: 11.2 ug/dL

## 2014-12-01 MED ORDER — SODIUM CHLORIDE 0.9 % IV BOLUS (SEPSIS)
250.0000 mL | Freq: Once | INTRAVENOUS | Status: AC
  Administered 2014-12-01: 250 mL via INTRAVENOUS

## 2014-12-01 NOTE — Progress Notes (Signed)
Pharmacist Heart Failure Core Measure Documentation  Assessment: [name] has an EF documented as 20-25% on 12/01/14 by ECHO.  Rationale: Heart failure patients with left ventricular systolic dysfunction (LVSD) and an EF < 40% should be prescribed an angiotensin converting enzyme inhibitor (ACEI) or angiotensin receptor blocker (ARB) at discharge unless a contraindication is documented in the medical record.  This patient is not currently on an ACEI or ARB for HF.  This note is being placed in the record in order to provide documentation that a contraindication to the use of these agents is present for this encounter.  ACE Inhibitor or Angiotensin Receptor Blocker is contraindicated (specify all that apply)  []   ACEI allergy AND ARB allergy []   Angioedema []   Moderate or severe aortic stenosis []   Hyperkalemia [x]   Hypotension []   Renal artery stenosis [x]   Worsening renal function, preexisting renal disease or dysfunction   [name], [name] 12/01/2014 1:33 PM

## 2014-12-01 NOTE — Progress Notes (Signed)
Internal med Md called informed of patient's b/p continuing to drop now at 79/65 hr 116 ,patient rhythm now a flutter. Will continue to monitor patient.

## 2014-12-01 NOTE — Progress Notes (Signed)
Teaching service MD called patient BP/ 92/30 hr 118 re sp 20 O2 sat 98%. Abd distended patient C/O abd pressure. No orders received  . Will continue to monitor patient.

## 2014-12-01 NOTE — Progress Notes (Signed)
*  PRELIMINARY RESULTS* Echocardiogram 2D Echocardiogram has been performed.  [name] 12/01/2014, 10:00 AM

## 2014-12-01 NOTE — Progress Notes (Signed)
Subjective: Feeling better this morning. Had 1 episode of diarrhea overnight.  Able to get up and get to the bathroom without dizziness. Objective: Vital signs in last 24 hours: Filed Vitals:   12/01/14 0400 12/01/14 0600 12/01/14 0715 12/01/14 0800  BP:  97/80 85/73 84/70   Pulse: 33 84 62 126  Temp:   97.8 F (36.6 C)   TempSrc:   Oral   Resp: 21 16 19 22   Height:      Weight:      SpO2: 98% 100% 100% 98%   Weight change:   Intake/Output Summary (Last 24 hours) at 12/01/14 0927 Last data filed at 12/01/14 0100  Gross per 24 hour  Intake    730 ml  Output      0 ml  Net    730 ml   General: resting in bed HEENT: PERRL, EOMI, no scleral icterus Cardiac: RRR, no rubs, murmurs or gallops, no JVD Pulm: clear to auscultation bilaterally Abd: soft, nontender, nondistended, BS present Ext: warm and well perfused, no pedal edema Neuro: alert and oriented X3  Lab Results: Basic Metabolic Panel:  Recent Labs Lab 11/30/14 1325 11/30/14 1347 12/01/14 0516  NA 141 140 134*  K 4.4 4.6 4.9  CL 108 107 107  CO2 23  --  20*  GLUCOSE 130* 122* 89  BUN 33* 43* 38*  CREATININE 1.82* 1.70* 2.06*  CALCIUM 9.1  --  8.5*  MG 2.0  --   --   PHOS 5.0*  --   --    Liver Function Tests:  Recent Labs Lab 11/30/14 1325  AST 22  ALT 18  ALKPHOS 60  BILITOT 0.9  PROT 7.1  ALBUMIN 3.5   CBC:  Recent Labs Lab 11/30/14 1325 11/30/14 1347  WBC 6.9  --   NEUTROABS 3.9  --   HGB 13.9 16.7  HCT 44.1 49.0  MCV 69.4*  --   PLT 168  --    Cardiac Enzymes:  Recent Labs Lab 12/01/14 0010 12/01/14 0516  TROPONINI <0.03 <0.03   Thyroid Function Tests:  Recent Labs Lab 12/01/14 0510  TSH 1.756   Coagulation:  Recent Labs Lab 11/30/14 1325  LABPROT 19.8*  INR 1.68*   Urinalysis:  Recent Labs Lab 11/30/14 1532  COLORURINE YELLOW  LABSPEC 1.031*  PHURINE 5.5  GLUCOSEU NEGATIVE  HGBUR NEGATIVE  BILIRUBINUR NEGATIVE  KETONESUR NEGATIVE  PROTEINUR  NEGATIVE  UROBILINOGEN 0.2  NITRITE NEGATIVE  LEUKOCYTESUR NEGATIVE   Micro Results: Recent Results (from the past 240 hour(s))  Urine culture     Status: None (Preliminary result)   Collection Time: 11/30/14  3:32 PM  Result Value Ref Range Status   Specimen Description URINE, CLEAN CATCH  Final   Special Requests NONE  Final   Culture PENDING  Incomplete   Report Status PENDING  Incomplete  MRSA PCR Screening     Status: None   Collection Time: 11/30/14  6:46 PM  Result Value Ref Range Status   MRSA by PCR NEGATIVE NEGATIVE Final    Comment:        The GeneXpert MRSA Assay (FDA approved for NASAL specimens only), is one component of a comprehensive MRSA colonization surveillance program. It is not intended to diagnose MRSA infection nor to guide or monitor treatment for MRSA infections.    Studies/Results: Ct Angio Chest Aorta W/cm &/or Wo/cm  11/30/2014   CLINICAL DATA:  Syncope, abdominal pain.  EXAM: CT ANGIOGRAPHY CHEST, ABDOMEN AND PELVIS  TECHNIQUE: Multidetector CT imaging through the chest, abdomen and pelvis was performed using the standard protocol during bolus administration of intravenous contrast. Multiplanar reconstructed images and MIPs were obtained and reviewed to evaluate the vascular anatomy.  CONTRAST:  180 mL OMNIPAQUE IOHEXOL 350 MG/ML SOLN ; patient had to be rescanned due to timing issue on original scan.  COMPARISON:  None.  FINDINGS: CTA CHEST FINDINGS  No pneumothorax or pleural effusion is noted. Cardiomegaly is noted. No acute pulmonary disease is noted. Probable minimal scarring is noted posteriorly in both lung bases. There is no evidence of thoracic aortic dissection or aneurysm. No definite evidence of pulmonary embolus is noted. Great vessels are widely patent. No significant osseous abnormality is noted. Left-sided pacemaker is noted with lead in grossly good position in the right ventricle. No mediastinal mass or adenopathy is noted.  Review of  the MIP images confirms the above findings.  CTA ABDOMEN AND PELVIS FINDINGS  No focal abnormality is noted in the liver, spleen or pancreas. Hepatic veins are significantly enlarged as well as the more superior aspect of the IVC which may be cardiac in etiology. Adrenal glands and kidneys appear normal. No hydronephrosis or renal obstruction is noted. The appendix appears normal. There is no evidence of bowel obstruction or ileus. No abnormal fluid collection is noted. Urinary bladder appears normal. No significant adenopathy is noted. Status post cholecystectomy.  There is no evidence of abdominal aortic aneurysm or dissection. Mesenteric and renal arteries are widely patent without significant stenosis. Iliac arteries are widely patent without significant stenosis.  Review of the MIP images confirms the above findings.  IMPRESSION: There is no evidence of thoracic or abdominal aortic dissection or aneurysm.  There is no evidence of pulmonary embolus.  Moderate cardiomegaly is noted. Significantly enlarged hepatic veins are noted suggesting elevated venous pressures most likely cardiac in origin.   Electronically Signed   By: Marijo Conception, M.D.   On: 11/30/2014 14:59   Ct Cta Abd/pel W/cm &/or W/o Cm  11/30/2014   CLINICAL DATA:  Syncope, abdominal pain.  EXAM: CT ANGIOGRAPHY CHEST, ABDOMEN AND PELVIS  TECHNIQUE: Multidetector CT imaging through the chest, abdomen and pelvis was performed using the standard protocol during bolus administration of intravenous contrast. Multiplanar reconstructed images and MIPs were obtained and reviewed to evaluate the vascular anatomy.  CONTRAST:  180 mL OMNIPAQUE IOHEXOL 350 MG/ML SOLN ; patient had to be rescanned due to timing issue on original scan.  COMPARISON:  None.  FINDINGS: CTA CHEST FINDINGS  No pneumothorax or pleural effusion is noted. Cardiomegaly is noted. No acute pulmonary disease is noted. Probable minimal scarring is noted posteriorly in both lung bases.  There is no evidence of thoracic aortic dissection or aneurysm. No definite evidence of pulmonary embolus is noted. Great vessels are widely patent. No significant osseous abnormality is noted. Left-sided pacemaker is noted with lead in grossly good position in the right ventricle. No mediastinal mass or adenopathy is noted.  Review of the MIP images confirms the above findings.  CTA ABDOMEN AND PELVIS FINDINGS  No focal abnormality is noted in the liver, spleen or pancreas. Hepatic veins are significantly enlarged as well as the more superior aspect of the IVC which may be cardiac in etiology. Adrenal glands and kidneys appear normal. No hydronephrosis or renal obstruction is noted. The appendix appears normal. There is no evidence of bowel obstruction or ileus. No abnormal fluid collection is noted. Urinary bladder appears normal. No significant adenopathy is  noted. Status post cholecystectomy.  There is no evidence of abdominal aortic aneurysm or dissection. Mesenteric and renal arteries are widely patent without significant stenosis. Iliac arteries are widely patent without significant stenosis.  Review of the MIP images confirms the above findings.  IMPRESSION: There is no evidence of thoracic or abdominal aortic dissection or aneurysm.  There is no evidence of pulmonary embolus.  Moderate cardiomegaly is noted. Significantly enlarged hepatic veins are noted suggesting elevated venous pressures most likely cardiac in origin.   Electronically Signed   By: Marijo Conception, M.D.   On: 11/30/2014 14:59   Medications: I have reviewed the patient's current medications. Scheduled Meds: . apixaban  5 mg Oral BID  . carvedilol  25 mg Oral BID WC  . sodium chloride  3 mL Intravenous Q12H   Continuous Infusions:  PRN Meds:.acetaminophen **OR** acetaminophen Assessment/Plan:    Hypotension/ Near syncope in the setting of CHRONIC SYSTOLIC HEART FAILURE due to  Nonischemic cardiomyopathy s/p ICD-[employer]  Scientific -Suspect this is predominantly due to hypovolemia from history of diarrhea in the setting of multiple antihypertensives. - He has been fluid responsive to small amounts of IVF bolus and is currently asymptomatic with the exception that his HR is still mildly elevated.  There is no evidence of cardiogenic shock given his lactic acid improvement. - Will transfer to telemetry today have patient ambulate around the unit. - Likely d/c tomorrow, while his BP remains low this will be beneficial for him with his CHF as long as he is not symptomatic. - Will need med[employer] device interrogation.  I called the device rep.    Essential hypertension, benign - Holding Antihypertensives in setting of hypotension, continuing coreg for rate control.    Atrial fibrillation - Continuing eliquis (CHADSVASC of 2) and Coreg for rate control - Rate is variable (not quite always below 110, occasionally reaching 120s-130s on tele)  May be due to some hypovolemia.  Will monitor patient today with ambulation.    AKI (acute kidney injury) - Very mild, likely due to hypovolemia. Will repeat BMP in AM  Diarrhea - Only one episode overnight.  No concerning risk factors for bacterial etiology.  Dispo: Disposition is deferred at this time, awaiting improvement of current medical problems.  Anticipated discharge in approximately 1 day(s).   The patient does have a current PCP Sandi Mariscal, MD) and does not need an [facility] hospital follow-up appointment after discharge.  The patient does not have transportation limitations that hinder transportation to clinic appointments.  .Services Needed at time of discharge: Y = Yes, Blank = No PT:   OT:   RN:   Equipment:   Other:     LOS: 1 day   Lucious Groves, DO 12/01/2014, 9:27 AM

## 2014-12-01 NOTE — Progress Notes (Signed)
UR COMPLETED  

## 2014-12-02 DIAGNOSIS — A084 Viral intestinal infection, unspecified: Secondary | ICD-10-CM

## 2014-12-02 LAB — BASIC METABOLIC PANEL
Anion gap: 10 (ref 5–15)
BUN: 43 mg/dL — ABNORMAL HIGH (ref 6–20)
CHLORIDE: 105 mmol/L (ref 101–111)
CO2: 19 mmol/L — ABNORMAL LOW (ref 22–32)
Calcium: 8.6 mg/dL — ABNORMAL LOW (ref 8.9–10.3)
Creatinine, Ser: 2.26 mg/dL — ABNORMAL HIGH (ref 0.61–1.24)
GFR calc Af Amer: 38 mL/min — ABNORMAL LOW (ref >60)
GFR calc non Af Amer: 33 mL/min — ABNORMAL LOW (ref >60)
GLUCOSE: 83 mg/dL (ref 65–99)
POTASSIUM: 5 mmol/L (ref 3.5–5.1)
Sodium: 134 mmol/L — ABNORMAL LOW (ref 135–145)

## 2014-12-02 MED ORDER — FUROSEMIDE 40 MG PO TABS
40.0000 mg | ORAL_TABLET | Freq: Every day | ORAL | Status: DC
  Administered 2014-12-02: 40 mg via ORAL
  Filled 2014-12-02 (×2): qty 1

## 2014-12-02 NOTE — Progress Notes (Signed)
Subjective: Continues to improve symptomatically.  He notes he has been able to move about the unit without dizziness.  He does report some abdominal distension, he notes he is eating about half of his meals, he is not having any more diarrhea.  He denies any nausea and vomiting, he is passing gas. He does ask today if Dr Caryl Comes could be notified that he is in the hospital, I reported that I would be happy to and asked if he wanted to me to call today or tomorrow (he was fine with tomorrow). Objective: Vital signs in last 24 hours: Filed Vitals:   12/02/14 0011 12/02/14 0103 12/02/14 0126 12/02/14 0551  BP: 88/61 87/72  90/78  Pulse: 104 128  60  Temp:   97.9 F (36.6 C) 97.7 F (36.5 C)  TempSrc:   Oral Oral  Resp:    18  Height:      Weight:    185 lb 6.4 oz (84.097 kg)  SpO2:   97% 100%   Weight change: 2 lb 6.7 oz (1.097 kg)  Intake/Output Summary (Last 24 hours) at 12/02/14 1135 Last data filed at 12/02/14 [id number]  Gross per 24 hour  Intake    720 ml  Output    675 ml  Net     45 ml   General: resting in bed in NAD HEENT: PERRL, EOMI, no scleral icterus Cardiac: RRR, no rubs, murmurs or gallops, no JVD Pulm: clear to auscultation bilaterally in all fields Abd: soft, nontender, nondistended, BS present Ext: warm and well perfused, no pedal edema Neuro: alert and oriented X3  Lab Results: Basic Metabolic Panel:  Recent Labs Lab 11/30/14 1325  12/01/14 0516 12/02/14 0327  NA 141  < > 134* 134*  K 4.4  < > 4.9 5.0  CL 108  < > 107 105  CO2 23  --  20* 19*  GLUCOSE 130*  < > 89 83  BUN 33*  < > 38* 43*  CREATININE 1.82*  < > 2.06* 2.26*  CALCIUM 9.1  --  8.5* 8.6*  MG 2.0  --   --   --   PHOS 5.0*  --   --   --   < > = values in this interval not displayed. Liver Function Tests:  Recent Labs Lab 11/30/14 1325  AST 22  ALT 18  ALKPHOS 60  BILITOT 0.9  PROT 7.1  ALBUMIN 3.5   CBC:  Recent Labs Lab 11/30/14 1325 11/30/14 1347  WBC 6.9  --     NEUTROABS 3.9  --   HGB 13.9 16.7  HCT 44.1 49.0  MCV 69.4*  --   PLT 168  --    Cardiac Enzymes:  Recent Labs Lab 12/01/14 0010 12/01/14 0516  TROPONINI <0.03 <0.03   Thyroid Function Tests:  Recent Labs Lab 12/01/14 0510  TSH 1.756   Coagulation:  Recent Labs Lab 11/30/14 1325  LABPROT 19.8*  INR 1.68*   Urinalysis:  Recent Labs Lab 11/30/14 1532  COLORURINE YELLOW  LABSPEC 1.031*  PHURINE 5.5  GLUCOSEU NEGATIVE  HGBUR NEGATIVE  BILIRUBINUR NEGATIVE  KETONESUR NEGATIVE  PROTEINUR NEGATIVE  UROBILINOGEN 0.2  NITRITE NEGATIVE  LEUKOCYTESUR NEGATIVE   Micro Results: Recent Results (from the past 240 hour(s))  Urine culture     Status: None   Collection Time: 11/30/14  3:32 PM  Result Value Ref Range Status   Specimen Description URINE, CLEAN CATCH  Final   Special Requests NONE  Final  Culture NO GROWTH 1 DAY  Final   Report Status 12/01/2014 FINAL  Final  MRSA PCR Screening     Status: None   Collection Time: 11/30/14  6:46 PM  Result Value Ref Range Status   MRSA by PCR NEGATIVE NEGATIVE Final    Comment:        The GeneXpert MRSA Assay (FDA approved for NASAL specimens only), is one component of a comprehensive MRSA colonization surveillance program. It is not intended to diagnose MRSA infection nor to guide or monitor treatment for MRSA infections.    Studies/Results: Ct Angio Chest Aorta W/cm &/or Wo/cm  11/30/2014   CLINICAL DATA:  Syncope, abdominal pain.  EXAM: CT ANGIOGRAPHY CHEST, ABDOMEN AND PELVIS  TECHNIQUE: Multidetector CT imaging through the chest, abdomen and pelvis was performed using the standard protocol during bolus administration of intravenous contrast. Multiplanar reconstructed images and MIPs were obtained and reviewed to evaluate the vascular anatomy.  CONTRAST:  180 mL OMNIPAQUE IOHEXOL 350 MG/ML SOLN ; patient had to be rescanned due to timing issue on original scan.  COMPARISON:  None.  FINDINGS: CTA CHEST  FINDINGS  No pneumothorax or pleural effusion is noted. Cardiomegaly is noted. No acute pulmonary disease is noted. Probable minimal scarring is noted posteriorly in both lung bases. There is no evidence of thoracic aortic dissection or aneurysm. No definite evidence of pulmonary embolus is noted. Great vessels are widely patent. No significant osseous abnormality is noted. Left-sided pacemaker is noted with lead in grossly good position in the right ventricle. No mediastinal mass or adenopathy is noted.  Review of the MIP images confirms the above findings.  CTA ABDOMEN AND PELVIS FINDINGS  No focal abnormality is noted in the liver, spleen or pancreas. Hepatic veins are significantly enlarged as well as the more superior aspect of the IVC which may be cardiac in etiology. Adrenal glands and kidneys appear normal. No hydronephrosis or renal obstruction is noted. The appendix appears normal. There is no evidence of bowel obstruction or ileus. No abnormal fluid collection is noted. Urinary bladder appears normal. No significant adenopathy is noted. Status post cholecystectomy.  There is no evidence of abdominal aortic aneurysm or dissection. Mesenteric and renal arteries are widely patent without significant stenosis. Iliac arteries are widely patent without significant stenosis.  Review of the MIP images confirms the above findings.  IMPRESSION: There is no evidence of thoracic or abdominal aortic dissection or aneurysm.  There is no evidence of pulmonary embolus.  Moderate cardiomegaly is noted. Significantly enlarged hepatic veins are noted suggesting elevated venous pressures most likely cardiac in origin.   Electronically Signed   By: Marijo Conception, M.D.   On: 11/30/2014 14:59   Ct Cta Abd/pel W/cm &/or W/o Cm  11/30/2014   CLINICAL DATA:  Syncope, abdominal pain.  EXAM: CT ANGIOGRAPHY CHEST, ABDOMEN AND PELVIS  TECHNIQUE: Multidetector CT imaging through the chest, abdomen and pelvis was performed using  the standard protocol during bolus administration of intravenous contrast. Multiplanar reconstructed images and MIPs were obtained and reviewed to evaluate the vascular anatomy.  CONTRAST:  180 mL OMNIPAQUE IOHEXOL 350 MG/ML SOLN ; patient had to be rescanned due to timing issue on original scan.  COMPARISON:  None.  FINDINGS: CTA CHEST FINDINGS  No pneumothorax or pleural effusion is noted. Cardiomegaly is noted. No acute pulmonary disease is noted. Probable minimal scarring is noted posteriorly in both lung bases. There is no evidence of thoracic aortic dissection or aneurysm. No definite evidence of  pulmonary embolus is noted. Great vessels are widely patent. No significant osseous abnormality is noted. Left-sided pacemaker is noted with lead in grossly good position in the right ventricle. No mediastinal mass or adenopathy is noted.  Review of the MIP images confirms the above findings.  CTA ABDOMEN AND PELVIS FINDINGS  No focal abnormality is noted in the liver, spleen or pancreas. Hepatic veins are significantly enlarged as well as the more superior aspect of the IVC which may be cardiac in etiology. Adrenal glands and kidneys appear normal. No hydronephrosis or renal obstruction is noted. The appendix appears normal. There is no evidence of bowel obstruction or ileus. No abnormal fluid collection is noted. Urinary bladder appears normal. No significant adenopathy is noted. Status post cholecystectomy.  There is no evidence of abdominal aortic aneurysm or dissection. Mesenteric and renal arteries are widely patent without significant stenosis. Iliac arteries are widely patent without significant stenosis.  Review of the MIP images confirms the above findings.  IMPRESSION: There is no evidence of thoracic or abdominal aortic dissection or aneurysm.  There is no evidence of pulmonary embolus.  Moderate cardiomegaly is noted. Significantly enlarged hepatic veins are noted suggesting elevated venous pressures most  likely cardiac in origin.   Electronically Signed   By: Marijo Conception, M.D.   On: 11/30/2014 14:59   Medications: I have reviewed the patient's current medications. Scheduled Meds: . apixaban  5 mg Oral BID  . carvedilol  25 mg Oral BID WC  . sodium chloride  3 mL Intravenous Q12H   Continuous Infusions:  PRN Meds:.acetaminophen **OR** acetaminophen Assessment/Plan:    Hypotension/ Near syncope due to hypovolemia in the setting of CHRONIC SYSTOLIC HEART FAILURE due to  Nonischemic cardiomyopathy s/p ICD-[employer] -Suspect this is predominantly due to hypovolemia from gastroenteritis in the setting of multiple antihypertensives. -  ICD interrogation shows known Afib with some RVR but no concerning findings of VT/VF causing firing of the device. - Will continue to hold his antihypertensives other than coreg. - Will give courtesy call to Dr Caryl Comes tomorrow morning to see if his group wants to Mr [facility] inpatient.  Although they may want to just follow up outpatient. - Patient feels like he is starting to become volume overloaded, not much evidence of exam but will start his oral lasix back today and monitor.    Essential hypertension, benign - Holding Antihypertensives in setting of hypotension, continuing coreg for rate control.    Atrial fibrillation - Continuing eliquis (CHADSVASC of 2) and Coreg for rate control - He was apparently not given his PM dose of Coreg last night due to lower BP readings, unfortunately the covering physician was not notified until 1:30am this morning of this and did not want to double his Coreg dose.  I would encourage that he continue to receive his Coreg for rate control as missing it did lead to some RVR overngiht to 130 on telemetry, if he is symptomatically hypotensive after the does would give a 250cc IVF bolus as he has been responsive.    AKI (acute kidney injury) - Very mild, likely due to hypovolemia but may have been compounded by double dose  of contrast for his CTA on admission. Baseline SCr is about 1.4-1.5, on admission this was up to 1.8 and it has trended up to 2.2 today.  Will need to continue to monitor for him for worsening of his renal function.  At this point I do not want to volume overload him and  will continue to support PO intake and holding of antihypertensives other than coreg.   Diarrhea/Gastroenteritis -  Supportive treatment, appears to be improving  Dispo: Disposition is deferred at this time, awaiting improvement of current medical problems.  Anticipated discharge in approximately 1 day(s).   The patient does have a current PCP Sandi Mariscal, MD) and does not need an [facility] hospital follow-up appointment after discharge.  The patient does not have transportation limitations that hinder transportation to clinic appointments.  .Services Needed at time of discharge: Y = Yes, Blank = No PT:   OT:   RN:   Equipment:   Other:     LOS: 2 days   Lucious Groves, DO 12/02/2014, 11:35 AM

## 2014-12-03 ENCOUNTER — Telehealth: Payer: Self-pay | Admitting: Nurse Practitioner

## 2014-12-03 ENCOUNTER — Inpatient Hospital Stay (HOSPITAL_COMMUNITY): Payer: 59

## 2014-12-03 DIAGNOSIS — I9589 Other hypotension: Secondary | ICD-10-CM

## 2014-12-03 DIAGNOSIS — I482 Chronic atrial fibrillation: Secondary | ICD-10-CM

## 2014-12-03 DIAGNOSIS — I428 Other cardiomyopathies: Secondary | ICD-10-CM

## 2014-12-03 LAB — URINALYSIS, ROUTINE W REFLEX MICROSCOPIC
Glucose, UA: NEGATIVE mg/dL
HGB URINE DIPSTICK: NEGATIVE
Ketones, ur: NEGATIVE mg/dL
Nitrite: NEGATIVE
PH: 5 (ref 5.0–8.0)
Protein, ur: 100 mg/dL — AB
SPECIFIC GRAVITY, URINE: 1.023 (ref 1.005–1.030)
Urobilinogen, UA: 1 mg/dL (ref 0.0–1.0)

## 2014-12-03 LAB — BASIC METABOLIC PANEL
Anion gap: 9 (ref 5–15)
BUN: 38 mg/dL — ABNORMAL HIGH (ref 6–20)
CHLORIDE: 103 mmol/L (ref 101–111)
CO2: 22 mmol/L (ref 22–32)
Calcium: 8.9 mg/dL (ref 8.9–10.3)
Creatinine, Ser: 1.99 mg/dL — ABNORMAL HIGH (ref 0.61–1.24)
GFR calc Af Amer: 45 mL/min — ABNORMAL LOW (ref >60)
GFR calc non Af Amer: 39 mL/min — ABNORMAL LOW (ref >60)
GLUCOSE: 91 mg/dL (ref 65–99)
POTASSIUM: 4.4 mmol/L (ref 3.5–5.1)
Sodium: 134 mmol/L — ABNORMAL LOW (ref 135–145)

## 2014-12-03 LAB — LIPASE, BLOOD: Lipase: 20 U/L — ABNORMAL LOW (ref 22–51)

## 2014-12-03 LAB — CBC
HEMATOCRIT: 42.3 % (ref 39.0–52.0)
Hemoglobin: 13.7 g/dL (ref 13.0–17.0)
MCH: 22.2 pg — ABNORMAL LOW (ref 26.0–34.0)
MCHC: 32.4 g/dL (ref 30.0–36.0)
MCV: 68.7 fL — ABNORMAL LOW (ref 78.0–100.0)
Platelets: 157 10*3/uL (ref 150–400)
RBC: 6.16 MIL/uL — ABNORMAL HIGH (ref 4.22–5.81)
RDW: 16.5 % — ABNORMAL HIGH (ref 11.5–15.5)
WBC: 6.5 10*3/uL (ref 4.0–10.5)

## 2014-12-03 LAB — URINE MICROSCOPIC-ADD ON

## 2014-12-03 MED ORDER — MORPHINE SULFATE (PF) 2 MG/ML IV SOLN
2.0000 mg | Freq: Once | INTRAVENOUS | Status: AC
  Administered 2014-12-03: 2 mg via INTRAVENOUS
  Filled 2014-12-03: qty 1

## 2014-12-03 MED ORDER — SENNOSIDES-DOCUSATE SODIUM 8.6-50 MG PO TABS
1.0000 | ORAL_TABLET | Freq: Every day | ORAL | Status: DC
  Administered 2014-12-03 – 2014-12-04 (×2): 1 via ORAL
  Filled 2014-12-03 (×3): qty 1

## 2014-12-03 MED ORDER — POLYETHYLENE GLYCOL 3350 17 G PO PACK
17.0000 g | PACK | Freq: Two times a day (BID) | ORAL | Status: DC
  Administered 2014-12-04 (×2): 17 g via ORAL
  Filled 2014-12-03 (×4): qty 1

## 2014-12-03 MED ORDER — PROMETHAZINE HCL 25 MG/ML IJ SOLN
12.5000 mg | Freq: Three times a day (TID) | INTRAMUSCULAR | Status: DC | PRN
  Administered 2014-12-03: 12.5 mg via INTRAVENOUS
  Filled 2014-12-03: qty 1

## 2014-12-03 MED ORDER — PROMETHAZINE HCL 25 MG/ML IJ SOLN: 12.5000 mg | Freq: Four times a day (QID) | INTRAMUSCULAR | Status: DC | PRN

## 2014-12-03 MED ORDER — GI COCKTAIL ~~LOC~~
30.0000 mL | Freq: Once | ORAL | Status: AC
  Administered 2014-12-03: 30 mL via ORAL
  Filled 2014-12-03: qty 30

## 2014-12-03 NOTE — Progress Notes (Addendum)
Patient complaining of abdominal pain similar to pain when he first came in, he says the pain moves around. Imaging reviewed- CTA abdomen negative for explanatory cause. He says he feels this way when he is fluid overloaded and wants his lasix restarted. Pt has an AKI, due to Contrast exposure X2. Lasix dose was held today, he got one dose yesterday. His weight appears stable. Last bowel movement today, pt says it was really hard. Also says he has not ben able to eat anything. Cr improving 1.9 today, CR- 12/02/2014- 2.2, baseline- 1.2 to 1.7.   Exam- Abd- Abdomen appears distended, but pt says this is usual for him. No focal tenderness, has bowel sounds  Plan- Miralax 1 packet BID and senakot 1 tab daily - Clears for now - Advance diet as tolerated - CBC- stable, not suggestive of infection - Bmet- Stable, Cr improving - Lipase- WNL - Resume Lasix am - Bmet am

## 2014-12-03 NOTE — Discharge Instructions (Addendum)
Please HOLD the Lisinopril for a week until you see Dr. Caryl Comes your heart doctor or your PCP  Please take 2 pills of lasix for 3 days then resume to 1 pill a day Please follow up with your PCP after 1 week  Atrial Fibrillation Atrial fibrillation is a type of irregular heart rhythm (arrhythmia). During atrial fibrillation, the upper chambers of the heart (atria) quiver continuously in a chaotic pattern. This causes an irregular and often rapid heart rate.  Atrial fibrillation is the result of the heart becoming overloaded with disorganized signals that tell it to beat. These signals are normally released one at a time by a part of the right atrium called the sinoatrial node. They then travel from the atria to the lower chambers of the heart (ventricles), causing the atria and ventricles to contract and pump blood as they pass. In atrial fibrillation, parts of the atria outside of the sinoatrial node also release these signals. This results in two problems. First, the atria receive so many signals that they do not have time to fully contract. Second, the ventricles, which can only receive one signal at a time, beat irregularly and out of rhythm with the atria.  There are three types of atrial fibrillation:   Paroxysmal. Paroxysmal atrial fibrillation starts suddenly and stops on its own within a week.  Persistent. Persistent atrial fibrillation lasts for more than a week. It may stop on its own or with treatment.  Permanent. Permanent atrial fibrillation does not go away. Episodes of atrial fibrillation may lead to permanent atrial fibrillation. Atrial fibrillation can prevent your heart from pumping blood normally. It increases your risk of stroke and can lead to heart failure.  CAUSES   Heart conditions, including a heart attack, heart failure, coronary artery disease, and heart valve conditions.   Inflammation of the sac that surrounds the heart (pericarditis).  Blockage of an artery in the  lungs (pulmonary embolism).  Pneumonia or other infections.  Chronic lung disease.  Thyroid problems, especially if the thyroid is overactive (hyperthyroidism).  Caffeine, excessive alcohol use, and use of some illegal drugs.   Use of some medicines, including certain decongestants and diet pills.  Heart surgery.   Birth defects.  Sometimes, no cause can be found. When this happens, the atrial fibrillation is called lone atrial fibrillation. The risk of complications from atrial fibrillation increases if you have lone atrial fibrillation and you are age 104 years or older. RISK FACTORS  Heart failure.  Coronary artery disease.  Diabetes mellitus.   High blood pressure (hypertension).   Obesity.   Other arrhythmias.   Increased age. SIGNS AND SYMPTOMS   A feeling that your heart is beating rapidly or irregularly.   A feeling of discomfort or pain in your chest.   Shortness of breath.   Sudden light-headedness or weakness.   Getting tired easily when exercising.   Urinating more often than normal (mainly when atrial fibrillation first begins).  In paroxysmal atrial fibrillation, symptoms may start and suddenly stop. DIAGNOSIS  Your health care provider may be able to detect atrial fibrillation when taking your pulse. Your health care provider may have you take a test called an ambulatory electrocardiogram (ECG). An ECG records your heartbeat patterns over a 24-hour period. You may also have other tests, such as:  Transthoracic echocardiogram (TTE). During echocardiography, sound waves are used to evaluate how blood flows through your heart.  Transesophageal echocardiogram (TEE).  Stress test. There is more than one type of  stress test. If a stress test is needed, ask your health care provider about which type is best for you.  Chest X-ray exam.  Blood tests.  Computed tomography (CT). TREATMENT  Treatment may include:  Treating any underlying  conditions. For example, if you have an overactive thyroid, treating the condition may correct atrial fibrillation.  Taking medicine. Medicines may be given to control a rapid heart rate or to prevent blood clots, heart failure, or a stroke.  Having a procedure to correct the rhythm of the heart:  Electrical cardioversion. During electrical cardioversion, a controlled, low-energy shock is delivered to the heart through your skin. If you have chest pain, very low blood pressure, or sudden heart failure, this procedure may need to be done as an emergency.  Catheter ablation. During this procedure, heart tissues that send the signals that cause atrial fibrillation are destroyed.  Surgical ablation. During this surgery, thin lines of heart tissue that carry the abnormal signals are destroyed. This procedure can either be an open-heart surgery or a minimally invasive surgery. With the minimally invasive surgery, small cuts are made to access the heart instead of a large opening.  Pulmonary venous isolation. During this surgery, tissue around the veins that carry blood from the lungs (pulmonary veins) is destroyed. This tissue is thought to carry the abnormal signals. HOME CARE INSTRUCTIONS   Take medicines only as directed by your health care provider. Some medicines can make atrial fibrillation worse or recur.  If blood thinners were prescribed by your health care provider, take them exactly as directed. Too much blood-thinning medicine can cause bleeding. If you take too little, you will not have the needed protection against stroke and other problems.  Perform blood tests at home if directed by your health care provider. Perform blood tests exactly as directed.  Quit smoking if you smoke.  Do not drink alcohol.  Do not drink caffeinated beverages such as coffee, soda, and some teas. You may drink decaffeinated coffee, soda, or tea.   Maintain a healthy weight.Do not use diet pills unless  your health care provider approves. They may make heart problems worse.   Follow diet instructions as directed by your health care provider.  Exercise regularly as directed by your health care provider.  Keep all follow-up visits as directed by your health care provider. This is important. PREVENTION  The following substances can cause atrial fibrillation to recur:   Caffeinated beverages.  Alcohol.  Certain medicines, especially those used for breathing problems.  Certain herbs and herbal medicines, such as those containing ephedra or ginseng.  Illegal drugs, such as cocaine and amphetamines. Sometimes medicines are given to prevent atrial fibrillation from recurring. Proper treatment of any underlying condition is also important in helping prevent recurrence.  SEEK MEDICAL CARE IF:  You notice a change in the rate, rhythm, or strength of your heartbeat.  You suddenly begin urinating more frequently.  You tire more easily when exerting yourself or exercising. SEEK IMMEDIATE MEDICAL CARE IF:   You have chest pain, abdominal pain, sweating, or weakness.  You feel nauseous.  You have shortness of breath.  You suddenly have swollen feet and ankles.  You feel dizzy.  Your face or limbs feel numb or weak.  You have a change in your vision or speech. MAKE SURE YOU:   Understand these instructions.  Will watch your condition.  Will get help right away if you are not doing well or get worse. Document Released: 04/06/2005 Document Revised:  08/21/2013 Document Reviewed: 05/17/2012 [employer] Patient Information 2015 [place], [place]. This information is not intended to replace advice given to you by your health care provider. Make sure you discuss any questions you have with your health care provider.  Hypotension As your heart beats, it forces blood through your arteries. This force is your blood pressure. If your blood pressure is too low for you to go about your normal  activities or to support the organs of your body, you have hypotension. Hypotension is also referred to as low blood pressure. When your blood pressure becomes too low, you may not get enough blood to your brain. As a result, you may feel weak, feel lightheaded, or develop a rapid heart rate. In a more severe case, you may faint. CAUSES Various conditions can cause hypotension. These include:  Blood loss.  Dehydration.  Heart or endocrine problems.  Pregnancy.  Severe infection.  Not having a well-balanced diet filled with needed nutrients.  Severe allergic reactions (anaphylaxis). Some medicines, such as blood pressure medicine or water pills (diuretics), may lower your blood pressure below normal. Sometimes taking too much medicine or taking medicine not as directed can cause hypotension. TREATMENT  Hospitalization is sometimes required for hypotension if fluid or blood replacement is needed, if time is needed for medicines to wear off, or if further monitoring is needed. Treatment might include changing your diet, changing your medicines (including medicines aimed at raising your blood pressure), and use of support stockings. HOME CARE INSTRUCTIONS   Drink enough fluids to keep your urine clear or pale yellow.  Take your medicines as directed by your health care provider.  Get up slowly from reclining or sitting positions. This gives your blood pressure a chance to adjust.  Wear support stockings as directed by your health care provider.  Maintain a healthy diet by including nutritious food, such as fruits, vegetables, nuts, whole grains, and lean meats. SEEK MEDICAL CARE IF:  You have vomiting or diarrhea.  You have a fever for more than 2-3 days.  You feel more thirsty than usual.  You feel weak and tired. SEEK IMMEDIATE MEDICAL CARE IF:   You have chest pain or a fast or irregular heartbeat.  You have a loss of feeling in some part of your body, or you lose movement  in your arms or legs.  You have trouble speaking.  You become sweaty or feel lightheaded.  You faint. MAKE SURE YOU:   Understand these instructions.  Will watch your condition.  Will get help right away if you are not doing well or get worse. Document Released: 04/06/2005 Document Revised: 01/25/2013 Document Reviewed: 10/07/2012 [facility] Patient Information 2015 [place], [place]. This information is not intended to replace advice given to you by your health care provider. Make sure you discuss any questions you have with your health care provider.

## 2014-12-03 NOTE — Progress Notes (Signed)
When I went to discuss with the patient about discharge instructions, Pt seemed to be in distress He mentioned that he was having diffuse abdominal pain that started 2 hours after he ate breakfast and was nauseous He said it felt like this when he initially came in and also said he felt bloated. He denied n/v since Am, said he has regular bowel movements, last one was yesterday, no history of constipation.   On exam, normal bowel sounds, abd diffusely tender to palpation. Looked distended.   We will postpone the discharge pending resolution of this. -Ordered GI cocktail -zofran 4 mg once.

## 2014-12-03 NOTE — Progress Notes (Signed)
Subjective: No acute events overnight Pt feels so much better after getting lasix and ready to go home  No diarrhea n/v/ dizziness etc.\  Objective: Vital signs in last 24 hours: Filed Vitals:   12/02/14 0551 12/02/14 1300 12/02/14 2216 12/03/14 0525  BP: 90/78 110/84 104/84 106/62  Pulse: 60 112 105 67  Temp: 97.7 F (36.5 C) 98.1 F (36.7 C) 98.6 F (37 C) 98.5 F (36.9 C)  TempSrc: Oral Oral Oral Oral  Resp: 18 18 18 18   Height:      Weight: 185 lb 6.4 oz (84.097 kg)   185 lb 6.4 oz (84.097 kg)  SpO2: 100% 100% 100% 97%   Weight change: 0 lb (0 kg)  Intake/Output Summary (Last 24 hours) at 12/03/14 0838 Last data filed at 12/03/14 0500  Gross per 24 hour  Intake    840 ml  Output   1400 ml  Net   -560 ml    Lab Results: @LABTEST2 @  CBC Latest Ref Rng 11/30/2014 11/30/2014 11/09/2014  WBC 4.0 - 10.5 K/uL - 6.9 9.9  Hemoglobin 13.0 - 17.0 g/dL 16.7 13.9 13.9  Hematocrit 39.0 - 52.0 % 49.0 44.1 43.0  Platelets 150 - 400 K/uL - 168 229    BMP Latest Ref Rng 12/02/2014 12/01/2014 11/30/2014  Glucose 65 - 99 mg/dL 83 89 122(H)  BUN 6 - 20 mg/dL 43(H) 38(H) 43(H)  Creatinine 0.61 - 1.24 mg/dL 2.26(H) 2.06(H) 1.70(H)  Sodium 135 - 145 mmol/L 134(L) 134(L) 140  Potassium 3.5 - 5.1 mmol/L 5.0 4.9 4.6  Chloride 101 - 111 mmol/L 105 107 107  CO2 22 - 32 mmol/L 19(L) 20(L) -  Calcium 8.9 - 10.3 mg/dL 8.6(L) 8.5(L) -      Micro Results: Recent Results (from the past 240 hour(s))  Urine culture     Status: None   Collection Time: 11/30/14  3:32 PM  Result Value Ref Range Status   Specimen Description URINE, CLEAN CATCH  Final   Special Requests NONE  Final   Culture NO GROWTH 1 DAY  Final   Report Status 12/01/2014 FINAL  Final  MRSA PCR Screening     Status: None   Collection Time: 11/30/14  6:46 PM  Result Value Ref Range Status   MRSA by PCR NEGATIVE NEGATIVE Final    Comment:        The GeneXpert MRSA Assay (FDA approved for NASAL specimens only), is one  component of a comprehensive MRSA colonization surveillance program. It is not intended to diagnose MRSA infection nor to guide or monitor treatment for MRSA infections.    Studies/Results: No results found. Medications: I have reviewed the patient's current medications. Scheduled Meds: . apixaban  5 mg Oral BID  . carvedilol  25 mg Oral BID WC  . furosemide  40 mg Oral Daily  . sodium chloride  3 mL Intravenous Q12H   Continuous Infusions:  PRN Meds:.acetaminophen **OR** acetaminophen Assessment/Plan: Principal Problem:   Hypotension Active Problems:   Essential hypertension, benign   Atrial fibrillation   SYSTOLIC HEART FAILURE, CHRONIC   ICD-[employer]   Nonischemic cardiomyopathy   AKI (acute kidney injury)   Near syncope   Viral gastroenteritis Hypotension/ Near syncope due to hypovolemia in the setting of CHRONIC SYSTOLIC HEART FAILURE due to Nonischemic cardiomyopathy s/p ICD-[employer] -Suspect this is predominantly due to hypovolemia from gastroenteritis in the setting of multiple antihypertensives. - ICD interrogation shows known Afib with some RVR but no concerning findings of VT/VF  causing firing of the device. - Will continue to hold his antihypertensives other than coreg. - Will schedule follow up with outpatient Dr. Caryl Comes for HFU --Pt feels better after starting lasix as yesterday he felt very volume overloaded - This is a delicate balance between having hydrated him due to hypovolemia and his existing CHF.   Essential hypertension, benign - Holding Antihypertensives in setting of hypotension, continuing coreg for rate control.   Atrial fibrillation - Continuing eliquis (CHADSVASC of 2) and Coreg for rate control   AKI (acute kidney injury) - Very mild, likely due to hypovolemia but may have been compounded by double dose of contrast for his CTA on admission. Baseline SCr is about 1.4-1.5, on admission this was up to 1.8 and it has  trended up to 2.2 today.  -Will need to monitor for him for worsening of his renal function at the hospital follow up.  Diarrhea/Gastroenteritis --Resolved  Dispo: Disposition is deferred at this time, awaiting improvement of current medical problems. Anticipated discharge in approximately 1 day(s).   Dispo: Disposition is deferred at this time, awaiting improvement of current medical problems.  Anticipated discharge in approximately 0 day(s).   The patient does have a current PCP Sandi Mariscal, MD) and does need an [facility] hospital follow-up appointment after discharge.  The patient does not have transportation limitations that hinder transportation to clinic appointments.  .Services Needed at time of discharge: Y = Yes, Blank = No PT:   OT:   RN:   Equipment:   Other:     LOS: 3 days   Burgess Estelle, MD 12/03/2014, 8:38 AM

## 2014-12-03 NOTE — Telephone Encounter (Signed)
New message      Appt made for 12-10-14 with Cecille Rubin.  Not sure if it is TCM because it was made by a nurse at [facility].

## 2014-12-03 NOTE — Progress Notes (Signed)
At 1515 introduced self to pt as in coming nurse 3-7p.  Call bell at reach.  Instructed to call for assistance.  Verbalized understanding.  Will continue to monitor.  Karie Kirks, Therapist, sports.

## 2014-12-03 NOTE — Progress Notes (Signed)
INTERNAL MEDICINE TEACHING SERVICE Night Float Progress Note   Subjective:    We were called overnight by the RN for evaluation of abdominal pain. At time of evaluation, pt was lying in on his side complaining of abdominal pain.  States that he believes his abdominal pain is related to being fluid overloaded in light of not having had his Lasix dose today.  He reports the GI cocktail given to him earlier today did not provide relief.  States the abdominal pain is diffuse and not localized to a particular area.  Did have a bowel movement shortly before we saw him.   Objective:    BP 112/85 mmHg  Pulse 112  Temp(Src) 98.3 F (36.8 C) (Oral)  Resp 18  Ht 5\' 7"  (1.702 m)  Wt 185 lb 6.4 oz (84.097 kg)  BMI 29.03 kg/m2  SpO2 96%   Labs: Basic Metabolic Panel:    Component Value Date/Time   NA 134* 12/03/2014 1455   K 4.4 12/03/2014 1455   CL 103 12/03/2014 1455   CO2 22 12/03/2014 1455   BUN 38* 12/03/2014 1455   CREATININE 1.99* 12/03/2014 1455   GLUCOSE 91 12/03/2014 1455   CALCIUM 8.9 12/03/2014 1455    CBC:    Component Value Date/Time   WBC 6.5 12/03/2014 1455   HGB 13.7 12/03/2014 1455   HCT 42.3 12/03/2014 1455   PLT 157 12/03/2014 1455   MCV 68.7* 12/03/2014 1455   NEUTROABS 3.9 11/30/2014 1325   LYMPHSABS 2.4 11/30/2014 1325   MONOABS 0.5 11/30/2014 1325   EOSABS 0.0 11/30/2014 1325   BASOSABS 0.1 11/30/2014 1325    Cardiac Enzymes: Lab Results  Component Value Date   CKTOTAL 94 05/15/2010   CKMB 0.9 05/15/2010   TROPONINI <0.03 12/01/2014    Physical Exam: General: Vital signs reviewed and noted. Well-developed, well-nourished, in no acute distress; alert, appropriate and cooperative throughout examination.  Lungs:  Normal respiratory effort. Clear to auscultation BL without crackles or wheezes.  Heart: RRR.   Abdomen:  BS normoactive. Soft, distended, tender to palpation throughout abdomen.  Slightly more tender in epigastric area.  No masses or  organomegaly.  Extremities: No pretibial edema.     Assessment/ Plan:    Patient complaining of diffuse abdominal pain and tenderness.  States he thinks related to volume overload 2/2 not receiving Lasix.  Appears euvolemic on physical exam.  Will hold lasix for now given AKI 2/2 to contrast exposure. -2mg  morphine IV now. -changed Phenergan dose to q6h prn -will obtain abdominal x-ray to assess for any perforation or obstruction   Jule Ser, DO  12/03/2014, 8:30 PM

## 2014-12-04 DIAGNOSIS — K529 Noninfective gastroenteritis and colitis, unspecified: Secondary | ICD-10-CM

## 2014-12-04 DIAGNOSIS — R109 Unspecified abdominal pain: Secondary | ICD-10-CM | POA: Insufficient documentation

## 2014-12-04 MED ORDER — FUROSEMIDE 10 MG/ML IJ SOLN
40.0000 mg | Freq: Two times a day (BID) | INTRAMUSCULAR | Status: DC
  Administered 2014-12-04: 40 mg via INTRAVENOUS
  Filled 2014-12-04 (×4): qty 4

## 2014-12-04 MED ORDER — SPIRONOLACTONE 25 MG PO TABS
25.0000 mg | ORAL_TABLET | Freq: Every day | ORAL | Status: DC
  Administered 2014-12-04 – 2014-12-05 (×2): 25 mg via ORAL
  Filled 2014-12-04 (×2): qty 1

## 2014-12-04 MED ORDER — ALLOPURINOL 100 MG PO TABS
100.0000 mg | ORAL_TABLET | Freq: Every day | ORAL | Status: DC
  Administered 2014-12-04 – 2014-12-05 (×2): 100 mg via ORAL
  Filled 2014-12-04 (×2): qty 1

## 2014-12-04 MED ORDER — FUROSEMIDE 40 MG PO TABS
40.0000 mg | ORAL_TABLET | Freq: Every day | ORAL | Status: DC
  Administered 2014-12-04 – 2014-12-05 (×2): 40 mg via ORAL
  Filled 2014-12-04 (×2): qty 1

## 2014-12-04 NOTE — Telephone Encounter (Signed)
Pt still currently admitted, not discharged 12/03/14.

## 2014-12-04 NOTE — Progress Notes (Signed)
Subjective: No acute events overnight. Pt has less abdominal pain, passing gas, no constipation. His home lasix was held yesterday due to concern regarding AKI Will restart spironolactone  Will contact his cardiologist.  Objective: Vital signs in last 24 hours: Filed Vitals:   01/01/2015 1732 01-Jan-2015 2100 12/04/14 0639 12/04/14 0944  BP: 112/85 77/58 99/74  101/74  Pulse: 112 46 63 106  Temp:  97.9 F (36.6 C) 97.9 F (36.6 C)   TempSrc:  Oral Oral   Resp:  18 20   Height:      Weight:   186 lb 6.4 oz (84.55 kg)   SpO2:  100% 98%    Weight change: 1 lb (0.454 kg)  Intake/Output Summary (Last 24 hours) at 12/04/14 1155 Last data filed at 12/04/14 0847  Gross per 24 hour  Intake    440 ml  Output    200 ml  Net    240 ml   General: resting in bed in NAD HEENT: PERRL, EOMI, no scleral icterus Cardiac: irregularly irregular rhythm, has an ICD placed, no murmurs, no JVD Pulm: clear to auscultation bilaterally in all fields Abd: soft, diffusely tender to palpation, somewhat distended than yesterday, +BS present Ext: warm and well perfused, no pedal edema Neuro: alert and oriented X3  Lab Results: @LABTEST2 @ CBC Latest Ref Rng 01-01-2015 11/30/2014 11/30/2014  WBC 4.0 - 10.5 K/uL 6.5 - 6.9  Hemoglobin 13.0 - 17.0 g/dL 13.7 16.7 13.9  Hematocrit 39.0 - 52.0 % 42.3 49.0 44.1  Platelets 150 - 400 K/uL 157 - 168    BMP Latest Ref Rng Jan 01, 2015 12/02/2014 12/01/2014  Glucose 65 - 99 mg/dL 91 83 89  BUN 6 - 20 mg/dL 38(H) 43(H) 38(H)  Creatinine 0.61 - 1.24 mg/dL 1.99(H) 2.26(H) 2.06(H)  Sodium 135 - 145 mmol/L 134(L) 134(L) 134(L)  Potassium 3.5 - 5.1 mmol/L 4.4 5.0 4.9  Chloride 101 - 111 mmol/L 103 105 107  CO2 22 - 32 mmol/L 22 19(L) 20(L)  Calcium 8.9 - 10.3 mg/dL 8.9 8.6(L) 8.5(L)    BNP (last 3 results)  Recent Labs  11/09/14 0740 11/30/14 1325  BNP 534.3* 857.9*    ProBNP (last 3 results) No results for input(s): PROBNP in the last 8760 hours.   Micro  Results: Recent Results (from the past 240 hour(s))  Urine culture     Status: None   Collection Time: 11/30/14  3:32 PM  Result Value Ref Range Status   Specimen Description URINE, CLEAN CATCH  Final   Special Requests NONE  Final   Culture NO GROWTH 1 DAY  Final   Report Status 12/01/2014 FINAL  Final  MRSA PCR Screening     Status: None   Collection Time: 11/30/14  6:46 PM  Result Value Ref Range Status   MRSA by PCR NEGATIVE NEGATIVE Final    Comment:        The GeneXpert MRSA Assay (FDA approved for NASAL specimens only), is one component of a comprehensive MRSA colonization surveillance program. It is not intended to diagnose MRSA infection nor to guide or monitor treatment for MRSA infections.    Studies/Results: Dg Abd 2 Views  2015-01-01   CLINICAL DATA:  Bloating, abdominal pain, shortness of breath  EXAM: ABDOMEN - 2 VIEW  COMPARISON:  CTA abdomen/pelvis dated 11/30/2014  FINDINGS: Nonobstructive bowel gas pattern.  No evidence of free air under the diaphragm on the upright view.  Cholecystectomy clips.  Cardiomegaly with ICD lead, incompletely visualized.  IMPRESSION: No  evidence of small bowel obstruction or free air.   Electronically Signed   By: Julian Hy M.D.   On: 12/03/2014 21:58   Medications: I have reviewed the patient's current medications. Scheduled Meds: . apixaban  5 mg Oral BID  . carvedilol  25 mg Oral BID WC  . furosemide  40 mg Oral Daily  . polyethylene glycol  17 g Oral BID  . senna-docusate  1 tablet Oral QHS  . sodium chloride  3 mL Intravenous Q12H  . spironolactone  25 mg Oral Daily   Continuous Infusions:  PRN Meds:.acetaminophen **OR** acetaminophen, promethazine Assessment/Plan: Principal Problem:   Hypotension Active Problems:   Essential hypertension, benign   Atrial fibrillation   SYSTOLIC HEART FAILURE, CHRONIC   ICD-[employer]   Nonischemic cardiomyopathy   AKI (acute kidney injury)   Near  syncope Hypotension/ Near syncope due to hypovolemia in the setting of CHRONIC SYSTOLIC HEART FAILURE due to Nonischemic cardiomyopathy s/p ICD-[employer].Suspect this is predominantly due to hypovolemia from gastroenteritis in the setting of multiple antihypertensives. ICD interrogation shows known Afib with some RVR but no concerning findings of VT/VF causing firing of the device. - Will restart his spironolactone today - Will schedule follow up with outpatient Dr. Caryl Comes for [place] --Will resume his lasix and give 2 doses of 40 mg lasix - This is a delicate balance between having hydrated him due to hypovolemia and his existing CHF.   Essential hypertension, benign - continuing coreg for rate control and restarted spironolactone  -holding lisinopril due to aki   Atrial fibrillation - Continuing eliquis (CHADSVASC of 2) and Coreg for rate control -continuing irregular rhythym   AKI (acute kidney injury) - Very mild, likely due to hypovolemia but may have been compounded by double dose of contrast for his CTA on admission. Baseline SCr is about 1.4-1.5, on admission this was up to 1.8 and it has trended down to 2 from 2.2 yesterday.   Holding lisinopril, resumed spironolactone   Abdominal Pain: Likely due to preceding viral GI illness, but his fluid status is likely contributing to this as pt mentions that he feels like this when he is volume overloaded as he did receive fluids over the weekend. Abdomen was more distended yesterday, but pt has no red flags for acute abdomen on physical exam, and his UA, lipase, CBC are all WNL  -Will give an extra dose of 40 mg PO lasix   Dispo: Disposition is deferred at this time, awaiting improvement of current medical problems.  Anticipated discharge in approximately 1 day(s).   The patient does have a current PCP Sandi Mariscal, MD) and does need an [facility] hospital follow-up appointment after discharge.  The patient does have transportation  limitations that hinder transportation to clinic appointments.  .Services Needed at time of discharge: Y = Yes, Blank = No PT:   OT:   RN:   Equipment:   Other:     LOS: 4 days   Burgess Estelle, MD 12/04/2014, 11:55 AM

## 2014-12-05 NOTE — Telephone Encounter (Signed)
Pt still currently admitted to hospital.

## 2014-12-05 NOTE — Progress Notes (Signed)
Subjective: Pt feeling much better and eager to go home and feels less distended   Received 40 mg IV lasix yesterday in addition to oral.  Urine output about 1 L over past 24 hours   Objective: Vital signs in last 24 hours: Filed Vitals:   12/04/14 0944 12/04/14 1446 12/04/14 2126 12/05/14 0643  BP: 101/74 91/58 94/73  97/68  Pulse: 106 66 67 68  Temp:  97.5 F (36.4 C) 98.3 F (36.8 C) 98 F (36.7 C)  TempSrc:  Oral Oral Oral  Resp:  18 18 18   Height:      Weight:    185 lb 3 oz (84 kg)  SpO2:  100% 100% 100%   Weight change: -1 lb 3.4 oz (-0.551 kg)  Intake/Output Summary (Last 24 hours) at 12/05/14 0953 Last data filed at 12/05/14 0645  Gross per 24 hour  Intake    840 ml  Output   2225 ml  Net  -1385 ml   General: resting in bed in NAD HEENT: PERRL, EOMI, no scleral icterus Cardiac: irregularly irregular rhythm, has an ICD placed, no murmurs, no JVD Pulm: clear to auscultation bilaterally in all fields Abd: soft, diffusely tender to palpation, nondistended, +BS present Ext: warm and well perfused, no pedal edema Neuro: alert and oriented X3   Lab Results: @LABTEST2 @ CBC Latest Ref Rng 12/07/14 11/30/2014 11/30/2014  WBC 4.0 - 10.5 K/uL 6.5 - 6.9  Hemoglobin 13.0 - 17.0 g/dL 13.7 16.7 13.9  Hematocrit 39.0 - 52.0 % 42.3 49.0 44.1  Platelets 150 - 400 K/uL 157 - 168    BMP Latest Ref Rng 12/07/14 12/02/2014 12/01/2014  Glucose 65 - 99 mg/dL 91 83 89  BUN 6 - 20 mg/dL 38(H) 43(H) 38(H)  Creatinine 0.61 - 1.24 mg/dL 1.99(H) 2.26(H) 2.06(H)  Sodium 135 - 145 mmol/L 134(L) 134(L) 134(L)  Potassium 3.5 - 5.1 mmol/L 4.4 5.0 4.9  Chloride 101 - 111 mmol/L 103 105 107  CO2 22 - 32 mmol/L 22 19(L) 20(L)  Calcium 8.9 - 10.3 mg/dL 8.9 8.6(L) 8.5(L)     Micro Results: Recent Results (from the past 240 hour(s))  Urine culture     Status: None   Collection Time: 11/30/14  3:32 PM  Result Value Ref Range Status   Specimen Description URINE, CLEAN CATCH  Final     Special Requests NONE  Final   Culture NO GROWTH 1 DAY  Final   Report Status 12/01/2014 FINAL  Final  MRSA PCR Screening     Status: None   Collection Time: 11/30/14  6:46 PM  Result Value Ref Range Status   MRSA by PCR NEGATIVE NEGATIVE Final    Comment:        The GeneXpert MRSA Assay (FDA approved for NASAL specimens only), is one component of a comprehensive MRSA colonization surveillance program. It is not intended to diagnose MRSA infection nor to guide or monitor treatment for MRSA infections.    Studies/Results: Dg Abd 2 Views  12-07-2014   CLINICAL DATA:  Bloating, abdominal pain, shortness of breath  EXAM: ABDOMEN - 2 VIEW  COMPARISON:  CTA abdomen/pelvis dated 11/30/2014  FINDINGS: Nonobstructive bowel gas pattern.  No evidence of free air under the diaphragm on the upright view.  Cholecystectomy clips.  Cardiomegaly with ICD lead, incompletely visualized.  IMPRESSION: No evidence of small bowel obstruction or free air.   Electronically Signed   By: Julian Hy M.D.   On: 12-07-14 21:58   Medications: I have  reviewed the patient's current medications. Scheduled Meds: . allopurinol  100 mg Oral Daily  . apixaban  5 mg Oral BID  . carvedilol  25 mg Oral BID WC  . furosemide  40 mg Oral Daily  . polyethylene glycol  17 g Oral BID  . senna-docusate  1 tablet Oral QHS  . sodium chloride  3 mL Intravenous Q12H  . spironolactone  25 mg Oral Daily   Continuous Infusions:  PRN Meds:.acetaminophen **OR** acetaminophen, promethazine Assessment/Plan: Principal Problem:   Hypotension Active Problems:   Essential hypertension, benign   Atrial fibrillation   SYSTOLIC HEART FAILURE, CHRONIC   ICD-[employer]   Nonischemic cardiomyopathy   AKI (acute kidney injury)   Near syncope   Acute abdominal pain Hypotension/ Near syncope due to hypovolemia in the setting of CHRONIC SYSTOLIC HEART FAILURE due to Nonischemic cardiomyopathy s/p ICD-[employer].Suspect this is predominantly due to hypovolemia from gastroenteritis in the setting of multiple antihypertensives. ICD interrogation shows known Afib with some RVR but no concerning findings of VT/VF causing firing of the device. - Received one dose of IV lasix and continuing oral lasix daily - Will schedule follow up with outpatient Dr. Caryl Comes for [place] - This is a delicate balance between having hydrated him due to hypovolemia and his existing CHF. - stable for discharge today   Essential hypertension, benign - continuing coreg for rate control and restarted spironolactone  -holding lisinopril due to aki   Atrial fibrillation - Continuing eliquis (CHADSVASC of 2) and Coreg for rate control -continuing irregular rhythym   AKI (acute kidney injury) - Resolving Holding lisinopril, resumed spironolactone   Abdominal Pain: Likely due to preceding viral GI illness, but his fluid status is likely contributing to this as pt mentions that he feels like this when he is volume overloaded as he did receive fluids over the weekend. Abdomen was more distended yesterday, but pt has no red flags for acute abdomen on physical exam, and his UA, lipase, CBC are all WNL  -Feeling much better today- UOP 1000 mL -Received extra dose of IV lasix  Dispo: Disposition is deferred at this time, awaiting improvement of current medical problems.  Anticipated discharge in approximately 0 day(s).   The patient does have a current PCP Sandi Mariscal, MD) and does need an [facility] hospital follow-up appointment after discharge.  The patient does not have transportation limitations that hinder transportation to clinic appointments.  .Services Needed at time of discharge: Y = Yes, Blank = No PT:   OT:   RN:   Equipment:   Other:     LOS: 5 days   Burgess Estelle, MD 12/05/2014, 9:53 AM

## 2014-12-05 NOTE — Discharge Summary (Signed)
Name: [name] MRN: [id number] DOB: 1970-10-14 44 y.o. PCP: Sandi Mariscal, MD  Date of Admission: 11/30/2014  1:18 PM Date of Discharge: 12/05/2014 Attending Physician: Axel Filler, MD  Discharge Diagnosis: 1. Hypovolemia 2/2 GI viral illness in a patient who continued to take antihypertensives in the presence of CHF.  Principal Problem:   Hypotension Active Problems:   Essential hypertension, benign   Atrial fibrillation   SYSTOLIC HEART FAILURE, CHRONIC   ICD-[employer]   Nonischemic cardiomyopathy   AKI (acute kidney injury)   Near syncope   Acute abdominal pain  Discharge Medications:   Medication List    STOP taking these medications        lisinopril 40 MG tablet  Commonly known as:  PRINIVIL,ZESTRIL      TAKE these medications        allopurinol 100 MG tablet  Commonly known as:  ZYLOPRIM  Take 100 mg by mouth daily.     apixaban 5 MG Tabs tablet  Commonly known as:  ELIQUIS  Take 1 tablet (5 mg total) by mouth 2 (two) times daily.     carvedilol 25 MG tablet  Commonly known as:  COREG  Take 1 tablet (25 mg total) by mouth 2 (two) times daily with a meal.     furosemide 40 MG tablet  Commonly known as:  LASIX  Take 1 tablet (40 mg total) by mouth daily.     spironolactone 25 MG tablet  Commonly known as:  ALDACTONE  Take 1 tablet (25 mg total) by mouth daily.        Disposition and follow-up:   Mr.[name] was discharged from [facility] in Good condition.  At the hospital follow up visit please address:  1.  BMET- has creatinine returned to baseline?      Please restart the lisinopril.  HTN: may want to adjust the BP meds as his BPs were soft throughout even though patient said that was the normal- concern due to afib.     2.  Labs / imaging needed at time of follow-up: BMP  3.  Pending labs/ test needing follow-up:   Follow-up Appointments:     Follow-up Information    Follow up with [name], NP. Schedule an appointment as soon as possible for a visit on 12/17/2014.   Specialties:  Nurse Practitioner, Interventional Cardiology, Cardiology, Radiology   Why:  hospital follow up appt 11 am   Contact information:   [place] [place] [address] [phone]       Follow up with Sandi Mariscal, MD. Schedule an appointment as soon as possible for a visit on 12/14/2014.   Specialty:  Internal Medicine   Why:  hospital follow up appt at [place] [facility] [place] information:   [address] [place] [address] [phone]       Discharge Instructions: Discharge Instructions    Call MD for:  persistant dizziness or light-headedness    Complete by:  As directed      Call MD for:  persistant dizziness or light-headedness    Complete by:  As directed      Diet - low sodium heart healthy    Complete by:  As directed      Diet - low sodium heart healthy    Complete by:  As directed      Discharge instructions    Complete by:  As directed  Please HOLD the Lisinopril and Spironolactone for a week until you see Dr. Caryl Comes your heart doctor Please continue your rest of home medicines     Discharge instructions    Complete by:  As directed   Please take 2 pills of lasix for 3 days then resume to 1 pill a day Please follow up with your PCP after 1 week     Increase activity slowly    Complete by:  As directed      Increase activity slowly    Complete by:  As directed            Consultations:    Procedures Performed:  Dg Abd 2 Views  12/03/2014   CLINICAL DATA:  Bloating, abdominal pain, shortness of breath  EXAM: ABDOMEN - 2 VIEW  COMPARISON:  CTA abdomen/pelvis dated 11/30/2014  FINDINGS: Nonobstructive bowel gas pattern.  No evidence of free air under the diaphragm on the upright view.  Cholecystectomy clips.  Cardiomegaly with ICD lead, incompletely visualized.  IMPRESSION: No evidence of small bowel obstruction or free air.    Electronically Signed   By: Julian Hy M.D.   On: 12/03/2014 21:58   Dg Abd Acute W/chest  11/09/2014   CLINICAL DATA:  Generalized abdominal pain and nausea.  EXAM: DG ABDOMEN ACUTE W/ 1V CHEST  COMPARISON:  July 01, 2011.  FINDINGS: There is no evidence of dilated bowel loops or free intraperitoneal air. Status post cholecystectomy. No radiopaque calculi or other significant radiographic abnormality is seen. Stable cardiomegaly is noted. Left-sided pacemaker is unchanged in position. No pneumothorax or pleural effusion is noted. Both lungs are clear.  IMPRESSION: No evidence of bowel obstruction or ileus. No acute cardiopulmonary disease.   Electronically Signed   By: Marijo Conception, M.D.   On: 11/09/2014 08:21   Ct Angio Chest Aorta W/cm &/or Wo/cm  11/30/2014   CLINICAL DATA:  Syncope, abdominal pain.  EXAM: CT ANGIOGRAPHY CHEST, ABDOMEN AND PELVIS  TECHNIQUE: Multidetector CT imaging through the chest, abdomen and pelvis was performed using the standard protocol during bolus administration of intravenous contrast. Multiplanar reconstructed images and MIPs were obtained and reviewed to evaluate the vascular anatomy.  CONTRAST:  180 mL OMNIPAQUE IOHEXOL 350 MG/ML SOLN ; patient had to be rescanned due to timing issue on original scan.  COMPARISON:  None.  FINDINGS: CTA CHEST FINDINGS  No pneumothorax or pleural effusion is noted. Cardiomegaly is noted. No acute pulmonary disease is noted. Probable minimal scarring is noted posteriorly in both lung bases. There is no evidence of thoracic aortic dissection or aneurysm. No definite evidence of pulmonary embolus is noted. Great vessels are widely patent. No significant osseous abnormality is noted. Left-sided pacemaker is noted with lead in grossly good position in the right ventricle. No mediastinal mass or adenopathy is noted.  Review of the MIP images confirms the above findings.  CTA ABDOMEN AND PELVIS FINDINGS  No focal abnormality is noted in the  liver, spleen or pancreas. Hepatic veins are significantly enlarged as well as the more superior aspect of the IVC which may be cardiac in etiology. Adrenal glands and kidneys appear normal. No hydronephrosis or renal obstruction is noted. The appendix appears normal. There is no evidence of bowel obstruction or ileus. No abnormal fluid collection is noted. Urinary bladder appears normal. No significant adenopathy is noted. Status post cholecystectomy.  There is no evidence of abdominal aortic aneurysm or dissection. Mesenteric and renal arteries are widely patent without significant stenosis. Iliac  arteries are widely patent without significant stenosis.  Review of the MIP images confirms the above findings.  IMPRESSION: There is no evidence of thoracic or abdominal aortic dissection or aneurysm.  There is no evidence of pulmonary embolus.  Moderate cardiomegaly is noted. Significantly enlarged hepatic veins are noted suggesting elevated venous pressures most likely cardiac in origin.   Electronically Signed   By: Marijo Conception, M.D.   On: 11/30/2014 14:59   Ct Cta Abd/pel W/cm &/or W/o Cm  11/30/2014   CLINICAL DATA:  Syncope, abdominal pain.  EXAM: CT ANGIOGRAPHY CHEST, ABDOMEN AND PELVIS  TECHNIQUE: Multidetector CT imaging through the chest, abdomen and pelvis was performed using the standard protocol during bolus administration of intravenous contrast. Multiplanar reconstructed images and MIPs were obtained and reviewed to evaluate the vascular anatomy.  CONTRAST:  180 mL OMNIPAQUE IOHEXOL 350 MG/ML SOLN ; patient had to be rescanned due to timing issue on original scan.  COMPARISON:  None.  FINDINGS: CTA CHEST FINDINGS  No pneumothorax or pleural effusion is noted. Cardiomegaly is noted. No acute pulmonary disease is noted. Probable minimal scarring is noted posteriorly in both lung bases. There is no evidence of thoracic aortic dissection or aneurysm. No definite evidence of pulmonary embolus is noted.  Great vessels are widely patent. No significant osseous abnormality is noted. Left-sided pacemaker is noted with lead in grossly good position in the right ventricle. No mediastinal mass or adenopathy is noted.  Review of the MIP images confirms the above findings.  CTA ABDOMEN AND PELVIS FINDINGS  No focal abnormality is noted in the liver, spleen or pancreas. Hepatic veins are significantly enlarged as well as the more superior aspect of the IVC which may be cardiac in etiology. Adrenal glands and kidneys appear normal. No hydronephrosis or renal obstruction is noted. The appendix appears normal. There is no evidence of bowel obstruction or ileus. No abnormal fluid collection is noted. Urinary bladder appears normal. No significant adenopathy is noted. Status post cholecystectomy.  There is no evidence of abdominal aortic aneurysm or dissection. Mesenteric and renal arteries are widely patent without significant stenosis. Iliac arteries are widely patent without significant stenosis.  Review of the MIP images confirms the above findings.  IMPRESSION: There is no evidence of thoracic or abdominal aortic dissection or aneurysm.  There is no evidence of pulmonary embolus.  Moderate cardiomegaly is noted. Significantly enlarged hepatic veins are noted suggesting elevated venous pressures most likely cardiac in origin.   Electronically Signed   By: Marijo Conception, M.D.   On: 11/30/2014 14:59    2D Echo: ef 20-25% 12/01/2014  Cardiac Cath:   Admission HPI:  44 yo man with hx of NICm s/p ICD placement in 1999 with EF of 20-25% last checked in 2014, afib, HTN, and prior DVT history who came in the ER with Hypotension and near syncope.  He says that he was feeling fine until this morning when he got up from a seated position and felt lightheaded and diaphoretic with some abdominal discomfort and distension. There was no LOC, postictal confusion or other red flags. He did not feel any palpitations, prodrome, and  his ICD did not fire, and no chest pain previously. EMS was called and then he was brought to the ER.   No recent fevers, n/v/ but reports some diarrhea. Denies melena or hematochezia. No increased dyspnea on exertion, lower extremity edema, orthopnea or PND. No change in weight. There have been no changes in the meds.  On exam In the ER, he was found to be hypotensive with BPS in the 90s/50s, tachycardic, and in afib with irregularly irregular rhythm and was receiving fluid bolus of NS. CTA was ordered given concern for aortic pathology, troponins were trended and and 1st one negative and his lactic acid was mildly elevated. EKG showed afib.  Hospital Course by problem list: Principal Problem:   Hypotension Active Problems:   Essential hypertension, benign   Atrial fibrillation   SYSTOLIC HEART FAILURE, CHRONIC   ICD-[employer]   Nonischemic cardiomyopathy   AKI (acute kidney injury)   Near syncope   Acute abdominal pain   1.  Hypovolemia secondary to diarrhea in a patient with severe systolic heart failure who continued to take his antihypertensives and anti-heart failure medicine: He was hydrated total about 500 mL because he was tachycardic and hypotensive with dry mucous membranes with irregularly irregular rhythm. All of his medications were held with the exception of the Coreg which he requires for rate control as he has a-fib. He did not complain of any symptoms of dizziness or lightheadedness during the hospital course. He has NICM since 1999 and ICD since 1999. Recently had ICD checked so pt said it was working fine. ICD interrogation shows known Afib with some RVR but no concerning findings of VT/VF causing firing of the device. Repeat echo showed EF 20-25%. With dilated left ventricles. That Sunday, his home lasix was restarted, and on Monday, he started complaining of abdominal pain and on exam the abdomen looked considerably distended and his weight was up 4 lbs. Labs ruled  out acute abdomen, and he mentioned that is how he felt when he is fluid overloaded. Spironolactone was restarted and one dose of IV lasix 40 mg was given the day prior 40 discharge. Patient was feeling better on the day of the discharge. Patient asked to take 2 pills of 40 mg lasix for 3 days after he gets home to get closer to his baseline weight and asked to follow up with cardiologist Dr Caryl Comes.  Atrial fibrillation  ICD interrogation shows known Afib with some RVR but no concerning findings of VT/VF causing firing of the device. Continued eliquis (CHADSVASC of 2) and Coreg for rate control.   Acute kidney injury: Likely secondary to hypovolemia with prerenal azotemia as well as secondary to the double dose dye load he received. In the presence of his CHF, he was very carefully rehydrated initially only, with oral hydration resumed afterwards. Baseline SCr is about 1.4-1.5, max on hospital course was 2.2 and it has trended down to 2 . Lisinopril was held until he sees his cardiologist.  Discharge Vitals:   BP 93/74 mmHg  Pulse 84  Temp(Src) 97.8 F (36.6 C) (Oral)  Resp 18  Ht 5\' 7"  (1.702 m)  Wt 185 lb 3 oz (84 kg)  BMI 29.00 kg/m2  SpO2 99%  Discharge Labs:  No results found for this or any previous visit (from the past 24 hour(s)).  Signed: Burgess Estelle, MD 12/05/2014, 1:16 PM    Services Ordered on Discharge:  Equipment Ordered on Discharge:

## 2014-12-06 NOTE — Telephone Encounter (Signed)
Patient contacted regarding discharge from [facility] on 12/05/2014.  Patient understands to follow up with provider [name] NP on 12/06/2014 at 10:00 at [address] office. Patient understands discharge instructions? yes Patient understands medications and regiment? yes Patient understands to bring all medications to this visit? yes

## 2014-12-10 ENCOUNTER — Ambulatory Visit (INDEPENDENT_AMBULATORY_CARE_PROVIDER_SITE_OTHER): Payer: 59 | Admitting: Nurse Practitioner

## 2014-12-10 ENCOUNTER — Encounter: Payer: Self-pay | Admitting: Nurse Practitioner

## 2014-12-10 ENCOUNTER — Other Ambulatory Visit: Payer: Self-pay | Admitting: Nurse Practitioner

## 2014-12-10 VITALS — BP 110/70 | HR 119 | Temp 97.2°F | Ht 67.0 in | Wt 179.6 lb

## 2014-12-10 DIAGNOSIS — I429 Cardiomyopathy, unspecified: Secondary | ICD-10-CM

## 2014-12-10 DIAGNOSIS — R110 Nausea: Secondary | ICD-10-CM | POA: Diagnosis not present

## 2014-12-10 DIAGNOSIS — I428 Other cardiomyopathies: Secondary | ICD-10-CM

## 2014-12-10 DIAGNOSIS — R140 Abdominal distension (gaseous): Secondary | ICD-10-CM

## 2014-12-10 DIAGNOSIS — R0600 Dyspnea, unspecified: Secondary | ICD-10-CM

## 2014-12-10 DIAGNOSIS — I4891 Unspecified atrial fibrillation: Secondary | ICD-10-CM | POA: Diagnosis not present

## 2014-12-10 LAB — BASIC METABOLIC PANEL
BUN: 35 mg/dL — ABNORMAL HIGH (ref 6–23)
CO2: 26 mEq/L (ref 19–32)
Calcium: 9.5 mg/dL (ref 8.4–10.5)
Chloride: 102 mEq/L (ref 96–112)
Creatinine, Ser: 2.02 mg/dL — ABNORMAL HIGH (ref 0.40–1.50)
GFR: 45.87 mL/min — ABNORMAL LOW (ref >60.00)
Glucose, Bld: 110 mg/dL — ABNORMAL HIGH (ref 70–99)
Potassium: 5.2 mEq/L — ABNORMAL HIGH (ref 3.5–5.1)
Sodium: 136 mEq/L (ref 135–145)

## 2014-12-10 LAB — LIPASE: Lipase: 16 U/L (ref 11.0–59.0)

## 2014-12-10 LAB — CBC
HCT: 46.1 % (ref 39.0–52.0)
Hemoglobin: 13.7 g/dL (ref 13.0–17.0)
MCHC: 29.8 g/dL — ABNORMAL LOW (ref 30.0–36.0)
MCV: 71.5 fl — ABNORMAL LOW (ref 78.0–100.0)
Platelets: 287 10*3/uL (ref 150.0–400.0)
RBC: 6.45 Mil/uL — ABNORMAL HIGH (ref 4.22–5.81)
RDW: 18.2 % — ABNORMAL HIGH (ref 11.5–15.5)
WBC: 7.8 10*3/uL (ref 4.0–10.5)

## 2014-12-10 LAB — HEPATIC FUNCTION PANEL
ALT: 25 U/L (ref 0–53)
AST: 20 U/L (ref 0–37)
Albumin: 3.8 g/dL (ref 3.5–5.2)
Alkaline Phosphatase: 82 U/L (ref 39–117)
Bilirubin, Direct: 0.4 mg/dL — ABNORMAL HIGH (ref 0.0–0.3)
Total Bilirubin: 1.1 mg/dL (ref 0.2–1.2)
Total Protein: 7.5 g/dL (ref 6.0–8.3)

## 2014-12-10 LAB — BRAIN NATRIURETIC PEPTIDE: Pro B Natriuretic peptide (BNP): 1807 pg/mL — ABNORMAL HIGH (ref 0.0–100.0)

## 2014-12-10 LAB — AMYLASE: Amylase: 41 U/L (ref 27–131)

## 2014-12-10 MED ORDER — FUROSEMIDE 40 MG PO TABS: 40.0000 mg | ORAL_TABLET | Freq: Two times a day (BID) | ORAL | Status: DC

## 2014-12-10 NOTE — Patient Instructions (Addendum)
We will be checking the following labs today - BMET, BNP, CBC, HPF, amylase, lipase   Medication Instructions:    Continue with your current medicines but  I am increasing the Lasix back to twice a day  Do not restart the Lisinopril yet    Testing/Procedures To Be Arranged:  TEE  Follow-Up:   Referral to CHF clinic    Other Special Instructions:  Your provider has recommended a TEE - transesophageal echocardiogram.  You are scheduled for your TEE on Wednesday, August 24th at 9am with Dr. Marlou Porch or associates. Please go to [facility] at Wednesday, August 24th at 7:30am.  Enter through the [place] not have any food or drink after midnight on Tuesday.  You may take your medicines with a sip of water on the day of your procedure.  You will need someone to drive you home following your procedure.    Call the [place] office at [phone] if you have any questions, problems or concerns.   Transesophageal Echocardiogram Transesophageal echocardiography (TEE) is a special type of test that produces images of the heart by using sound waves (echocardiogram). This type of echocardiography can obtain better images of the heart than standard echocardiography. TEE is done by passing a flexible tube down the esophagus. The heart is located in front of the esophagus. Because the heart and esophagus are close to one another, your health care provider can take very clear, detailed pictures of the heart via ultrasound waves. TEE may be done:  If your health care provider needs more information based on standard echocardiography findings.  If you had a stroke. This might have happened because a clot formed in your heart. TEE can visualize different areas of the heart and check for clots.  To check valve anatomy and function.  To check for infection on the inside of your heart (endocarditis).  To evaluate the  dividing wall (septum) of the heart and presence of a hole that did not close after birth (patent foramen ovale or atrial septal defect).  To help diagnose a tear in the wall of the aorta (aortic dissection).  During cardiac valve surgery. This allows the surgeon to assess the valve repair before closing the chest.  During a variety of other cardiac procedures to guide positioning of catheters.  Sometimes before a cardioversion, which is a shock to convert heart rhythm back to normal.  LET [facility] CARE PROVIDER KNOW ABOUT:   Any allergies you have.  All medicines you are taking, including vitamins, herbs, eye drops, creams, and over-the-counter medicines.  Previous problems you or members of your family have had with the use of anesthetics.  Any blood disorders you have.  Previous surgeries you have had.  Medical conditions you have.  Swallowing difficulties.  An esophageal obstruction.  RISKS AND COMPLICATIONS  Generally, TEE is a safe procedure. However, as with any procedure, complications can occur. Possible complications include an esophageal tear (rupture), perforation, aspiration and/or oversedation. You may have a sore throat following this procedure.  BEFORE THE PROCEDURE   Do not eat or drink for 6 hours before the procedure or as directed by your health care provider.  Arrange for someone to drive you home after the procedure. Do not drive yourself home. During the procedure, you will be given medicines that can continue to make you feel drowsy and can impair your reflexes.  An IV access tube will be started  in the arm.  PROCEDURE   A medicine to help you relax (sedative) will be given through the IV access tube.  A medicine may be sprayed or gargled to numb the back of the throat.  Your blood pressure, heart rate, and breathing (vital signs) will be monitored during the procedure.  The TEE probe is a long, flexible tube. The tip of the probe is placed  into the back of the mouth, and you will be asked to swallow. This helps to pass the tip of the probe into the esophagus. Once the tip of the probe is in the correct area, your health care provider can take pictures of the heart.  TEE is usually not a painful procedure. You may feel the probe press against the back of the throat. The probe does not enter the trachea and does not affect your breathing.  AFTER THE PROCEDURE   You will be in bed, resting, until you have fully returned to consciousness.  When you first awaken, your throat may feel slightly sore and will probably still feel numb. This will improve slowly over time.  You will not be allowed to eat or drink until it is clear that the numbness has improved.  Once you have been able to drink, urinate, and sit on the edge of the bed without feeling sick to your stomach (nausea) or dizzy, you may be cleared to go home.  You should have a friend or family member with you for the next 24 hours after your procedure.     Call the [place] office at [phone] if you have any questions, problems or concerns.

## 2014-12-10 NOTE — Progress Notes (Signed)
CARDIOLOGY OFFICE NOTE  Date:  12/10/2014    [name] Date of Birth: [birth year] Medical Record [id number]  PCP:  Sandi Mariscal, MD  Cardiologist:  Caryl Comes    Chief Complaint  Patient presents with  . Post hospital for hypotension with NICM    Seen for Dr. Caryl Comes    History of Present Illness: [name] is a 44 y.o. male who presents today for a post hospital visit. Seen for Dr. Caryl Comes. He has a NICM with ICD placement in 1999 with EF of 20-25%, afib, HTN, and prior DVT history.  Last seen here in the office in September of last year.   He presented earlier this month to the ER with hypotension and near syncope. He had had diarrhea. In the ER, he was found to be hypotensive with BPS in the 90s/50s, tachycardic, and in afib with irregularly irregular rhythm and was receiving fluid bolus of NS. CTA was ordered given concern for aortic pathology - this was negative, troponins were trended and and 1st one negative and his lactic acid was mildly elevated. EKG showed afib.  He was hydrated. Medicines were held except for Coreg. Remained in AF. Diuretics restarted due to volume excess.  Continued on Eliquis. He was not sent home on his ACE. Had abdominal films and CT abdomen and chest with no acute findings. He has had prior cholecystectomy.  Comes back today. Here alone. Nauseated. No vomiting. This started back yesterday. When he first got home he felt fine. Now, no appetite. Not dizzy. Some shortness of breath - can't lie flat - sleeping sitting up. No swelling but belly is bloated.  His dose of Lasix was increased to BID up until Saturday. He felt like he was doing better while he was on the higher dose. No chest pain. No more diarrhea.   Past Medical History  Diagnosis Date  . Chronic systolic heart failure     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%)  . Atrial fibrillation -parosysmal      Rx w amiodarone  . Noncompliance     H/O  MEDICAL NONCOMPLIANCE  .  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 Tricuspid valve regurgitation     SEVERE  . Severe mitral regurgitation   . Polymorphic ventricular tachycardia     RECURRENT WITH APPROPRIATE SHOCK THERAPY IN THE PAST  . Ventricular fibrillation     WITH APPROPRIATE SHOCK THERAPY IN THE PAST  . Hypertension   . Gout   . RA (rheumatoid arthritis)   . Automatic implantable cardiac defibrillator -BSX     single chamber  . GI bleed -massive     11 Units 2012  . Elevated LFTs   . H/O hyperthyroidism   . CHF (congestive heart failure)   . AKI (acute kidney injury)     Past Surgical History  Procedure Laterality Date  . Cardiac catheterization  06/2006    RIGHT HEART CATH SHOWING SEVERE BIVENTRICUALR CHF WITH MARKED FILLING AND PRESSURES  . Insert / replace / remove pacemaker      GUIDANT HE ICD MODEL 2180, SERIAL # [id number]  . Cholecystectomy    . Implantable cardioverter defibrillator generator change N/A 07/01/2011    Procedure: IMPLANTABLE CARDIOVERTER DEFIBRILLATOR GENERATOR CHANGE;  Surgeon: Deboraha Sprang, MD;  Location: [facility] CATH LAB;  Service: Cardiovascular;  Laterality: N/A;     Medications: Current Outpatient Prescriptions  Medication Sig Dispense Refill  . allopurinol (  ZYLOPRIM) 100 MG tablet Take 100 mg by mouth daily.  0  . apixaban (ELIQUIS) 5 MG TABS tablet Take 1 tablet (5 mg total) by mouth 2 (two) times daily. 60 tablet 3  . carvedilol (COREG) 25 MG tablet Take 1 tablet (25 mg total) by mouth 2 (two) times daily with a meal. 60 tablet 1  . furosemide (LASIX) 40 MG tablet Take 1 tablet (40 mg total) by mouth 2 (two) times daily. 30 tablet 10  . spironolactone (ALDACTONE) 25 MG tablet Take 1 tablet (25 mg total) by mouth daily. 30 tablet 5   No current facility-administered medications for this visit.    Allergies: No Known Allergies  Social History: The patient  reports that he has never smoked. He has never used smokeless  tobacco. He reports that he does not drink alcohol or use illicit drugs.   Family History: The patient's family history includes Heart failure in his brother.   Review of Systems: Please see the history of present illness.   Otherwise, the review of systems is positive for none.   All other systems are reviewed and negative.   Physical Exam: VS:  BP 110/70 mmHg  Pulse 119  Temp(Src) 97.2 F (36.2 C)  Ht 5\' 7"  (1.702 m)  Wt 179 lb 9.6 oz (81.466 kg)  BMI 28.12 kg/m2 .  BMI Body mass index is 28.12 kg/(m^2).  Wt Readings from Last 3 Encounters:  12/10/14 179 lb 9.6 oz (81.466 kg)  12/05/14 185 lb 3 oz (84 kg)  11/09/14 185 lb (83.915 kg)    General: Pleasant. He is alert but looks like he feels puny but in no acute distress. Weight is down 6 pounds.  HEENT: Normal. Neck: Supple, his neck veins are flat, no carotid bruits or masses noted.  Cardiac: Irregular irregular rate and rhythm. III/VI systolic murmur at the apex noted. No edema.  Respiratory:  Lungs are clear to auscultation bilaterally with normal work of breathing.  GI: Soft and nontender. Belly is protruberant.  MS: No deformity or atrophy. Gait and ROM intact. Skin: Warm and dry. Color is normal.  Neuro:  Strength and sensation are intact and no gross focal deficits noted.  Psych: Alert, appropriate and with normal affect.   LABORATORY DATA:  EKG:  EKG is ordered today. This demonstrates AF with RVR.  Lab Results  Component Value Date   WBC 6.5 12/03/2014   HGB 13.7 12/03/2014   HCT 42.3 12/03/2014   PLT 157 12/03/2014   GLUCOSE 91 12/03/2014   CHOL  02/05/2009    136        ATP III CLASSIFICATION:  <200     mg/dL   Desirable  200-239  mg/dL   Borderline High  >=240    mg/dL   High          TRIG 187* 02/05/2009   HDL 30* 02/05/2009   LDLCALC  02/05/2009    69        Total Cholesterol/HDL:CHD Risk Coronary Heart Disease Risk Table                     Men   Women  1/2 Average Risk   3.4   3.3   Average Risk       5.0   4.4  2 X Average Risk   9.6   7.1  3 X Average Risk  23.4   11.0        Use the calculated Patient  Ratio above and the CHD Risk Table to determine the patient's CHD Risk.        ATP III CLASSIFICATION (LDL):  <100     mg/dL   Optimal  100-129  mg/dL   Near or Above                    Optimal  130-159  mg/dL   Borderline  160-189  mg/dL   High  >190     mg/dL   Very High   ALT 18 11/30/2014   AST 22 11/30/2014   NA 134* 12/03/2014   K 4.4 12/03/2014   CL 103 12/03/2014   CREATININE 1.99* 12/03/2014   BUN 38* 12/03/2014   CO2 22 12/03/2014   TSH 1.756 12/01/2014   INR 1.68* 11/30/2014    BNP (last 3 results)  Recent Labs  11/09/14 0740 11/30/14 1325  BNP 534.3* 857.9*    ProBNP (last 3 results) No results for input(s): PROBNP in the last 8760 hours.   Other Studies Reviewed Today:  Echo Study Conclusions from 11/2014  - Left ventricle: The cavity size was moderately dilated. Wall thickness was increased in a pattern of moderate LVH. Systolic function was severely reduced. The estimated ejection fraction was in the range of 20% to 25%. Dyskinesis of the apicalapical myocardium. - Aortic valve: There was trivial regurgitation. - Mitral valve: There was moderate to severe regurgitation. - Left atrium: The atrium was moderately to severely dilated. - Right ventricle: The cavity size was mildly dilated. - Right atrium: The atrium was severely dilated. - Tricuspid valve: There was moderate regurgitation. - Pulmonary arteries: Systolic pressure was mildly increased. PA peak pressure: 36 mm Hg (S). - Pericardium, extracardiac: A trivial pericardial effusion was identified  Assessment/Plan: 1. NICM - EF is 20 to 25%. Now with more MR noted. He has more symptoms of heart failure with increasing PND, orthopnea and abdominal bloating/nausea.  I suspect that it is his heart failure that is driving his symptoms. Will arrange for TEE.  Increase Lasix back to BID. Check lab today and will hold on restarting his ACE for now. I think he would benefit from CHF referral and discussion of possible advanced therapies.  He has had his cardiomyopathy for many years. Discussed with Dr. Angelena Form (DOD) and he is in agreement with this plan of care. Will place him out of work for now while evaluation proceeds.   2. Recent episode of hypotension/diarrhea  3. AF - rate is up today but he has not been able to take his medicines today due to nausea.   4. Chronic anticoagulation  Current medicines are reviewed with the patient today.  The patient does not have concerns regarding medicines other than what has been noted above.  The following changes have been made:  See above.  Labs/ tests ordered today include:    Orders Placed This Encounter  Procedures  . Brain natriuretic peptide  . Basic metabolic panel  . CBC  . Hepatic function panel  . Amylase  . Lipase  . EKG 12-Lead     Disposition:   FU with Dr. Caryl Comes as planned.  Arranging CHF referral as well.   Patient is agreeable to this plan and will call if any problems develop in the interim.   Signed: Burtis Junes, RN, ANP-C 12/10/2014 11:54 AM  [facility] [address] [place], [place]  [address] Phone: [phone] Fax: [phone]

## 2014-12-12 ENCOUNTER — Ambulatory Visit (HOSPITAL_BASED_OUTPATIENT_CLINIC_OR_DEPARTMENT_OTHER)
Admission: RE | Admit: 2014-12-12 | Discharge: 2014-12-12 | Disposition: A | Discharge status: Home / Self Care | Payer: 59 | Source: Ambulatory Visit | Attending: Cardiology | Admitting: Cardiology

## 2014-12-12 ENCOUNTER — Telehealth: Payer: Self-pay | Admitting: *Deleted

## 2014-12-12 ENCOUNTER — Encounter (HOSPITAL_COMMUNITY): Payer: Self-pay | Admitting: *Deleted

## 2014-12-12 ENCOUNTER — Ambulatory Visit (HOSPITAL_COMMUNITY): Admission: RE | Admit: 2014-12-12 | Payer: 59 | Source: Ambulatory Visit | Admitting: Cardiology

## 2014-12-12 ENCOUNTER — Encounter (HOSPITAL_COMMUNITY)
Admission: RE | Disposition: A | Discharge status: Home / Self Care | Payer: Self-pay | Source: Ambulatory Visit | Attending: Cardiology

## 2014-12-12 ENCOUNTER — Ambulatory Visit (HOSPITAL_COMMUNITY)
Admission: RE | Admit: 2014-12-12 | Discharge: 2014-12-12 | Disposition: A | Discharge status: Home / Self Care | Payer: 59 | Source: Ambulatory Visit | Attending: Nurse Practitioner | Admitting: Nurse Practitioner

## 2014-12-12 DIAGNOSIS — I4891 Unspecified atrial fibrillation: Secondary | ICD-10-CM | POA: Insufficient documentation

## 2014-12-12 DIAGNOSIS — I5022 Chronic systolic (congestive) heart failure: Secondary | ICD-10-CM

## 2014-12-12 DIAGNOSIS — I482 Chronic atrial fibrillation: Secondary | ICD-10-CM | POA: Diagnosis not present

## 2014-12-12 HISTORY — PX: TEE WITHOUT CARDIOVERSION: SHX5443

## 2014-12-12 SURGERY — ECHOCARDIOGRAM, TRANSESOPHAGEAL
Anesthesia: Moderate Sedation

## 2014-12-12 MED ORDER — MIDAZOLAM HCL 5 MG/ML IJ SOLN
INTRAMUSCULAR | Status: AC
  Filled 2014-12-12: qty 2

## 2014-12-12 MED ORDER — FENTANYL CITRATE (PF) 100 MCG/2ML IJ SOLN
INTRAMUSCULAR | Status: DC | PRN
  Administered 2014-12-12 (×2): 25 ug via INTRAVENOUS

## 2014-12-12 MED ORDER — ONDANSETRON HCL 4 MG/2ML IJ SOLN
INTRAMUSCULAR | Status: AC
  Filled 2014-12-12: qty 2

## 2014-12-12 MED ORDER — MIDAZOLAM HCL 10 MG/2ML IJ SOLN
INTRAMUSCULAR | Status: DC | PRN
  Administered 2014-12-12: 2 mg via INTRAVENOUS
  Administered 2014-12-12 (×2): 1 mg via INTRAVENOUS

## 2014-12-12 MED ORDER — FENTANYL CITRATE (PF) 100 MCG/2ML IJ SOLN
INTRAMUSCULAR | Status: AC
  Filled 2014-12-12: qty 2

## 2014-12-12 MED ORDER — BUTAMBEN-TETRACAINE-BENZOCAINE 2-2-14 % EX AERO
INHALATION_SPRAY | CUTANEOUS | Status: DC | PRN
Start: 2014-12-12 — End: 2014-12-12
  Administered 2014-12-12: 2 via TOPICAL

## 2014-12-12 MED ORDER — SODIUM CHLORIDE 0.9 % IV SOLN
INTRAVENOUS | Status: DC
  Administered 2014-12-12: 500 mL via INTRAVENOUS

## 2014-12-12 NOTE — Discharge Instructions (Signed)
Transesophageal Echocardiogram °Transesophageal echocardiography (TEE) is a special type of test that produces images of the heart by using sound waves (echocardiogram). This type of echocardiography can obtain better images of the heart than standard echocardiography. TEE is done by passing a flexible tube down the esophagus. The heart is located in front of the esophagus. Because the heart and esophagus are close to one another, your health care provider can take very clear, detailed pictures of the heart via ultrasound waves. °TEE may be done: °· If your health care provider needs more information based on standard echocardiography findings. °· If you had a stroke. This might have happened because a clot formed in your heart. TEE can visualize different areas of the heart and check for clots. °· To check valve anatomy and function. °· To check for infection on the inside of your heart (endocarditis). °· To evaluate the dividing wall (septum) of the heart and presence of a hole that did not close after birth (patent foramen ovale or atrial septal defect). °· To help diagnose a tear in the wall of the aorta (aortic dissection). °· During cardiac valve surgery. This allows the surgeon to assess the valve repair before closing the chest. °· During a variety of other cardiac procedures to guide positioning of catheters. °· Sometimes before a cardioversion, which is a shock to convert heart rhythm back to normal. °LET YOUR HEALTH CARE PROVIDER KNOW ABOUT:  °· Any allergies you have. °· All medicines you are taking, including vitamins, herbs, eye drops, creams, and over-the-counter medicines. °· Previous problems you or members of your family have had with the use of anesthetics. °· Any blood disorders you have. °· Previous surgeries you have had. °· Medical conditions you have. °· Swallowing difficulties. °· An esophageal obstruction. °RISKS AND COMPLICATIONS  °Generally, TEE is a safe procedure. However, as with any  procedure, complications can occur. Possible complications include an esophageal tear (rupture). °BEFORE THE PROCEDURE  °· Do not eat or drink for 6 hours before the procedure or as directed by your health care provider. °· Arrange for someone to drive you home after the procedure. Do not drive yourself home. During the procedure, you will be given medicines that can continue to make you feel drowsy and can impair your reflexes. °· An IV access tube will be started in the arm. °PROCEDURE  °· A medicine to help you relax (sedative) will be given through the IV access tube. °· A medicine may be sprayed or gargled to numb the back of the throat. °· Your blood pressure, heart rate, and breathing (vital signs) will be monitored during the procedure. °· The TEE probe is a long, flexible tube. The tip of the probe is placed into the back of the mouth, and you will be asked to swallow. This helps to pass the tip of the probe into the esophagus. Once the tip of the probe is in the correct area, your health care provider can take pictures of the heart. °· TEE is usually not a painful procedure. You may feel the probe press against the back of the throat. The probe does not enter the trachea and does not affect your breathing. °AFTER THE PROCEDURE  °· You will be in bed, resting, until you have fully returned to consciousness. °· When you first awaken, your throat may feel slightly sore and will probably still feel numb. This will improve slowly over time. °· You will not be allowed to eat or drink until it   is clear that the numbness has improved. °· Once you have been able to drink, urinate, and sit on the edge of the bed without feeling sick to your stomach (nausea) or dizzy, you may be cleared to go home. °· You should have a friend or family member with you for the next 24 hours after your procedure. °Document Released: 06/27/2002 Document Revised: 04/11/2013 Document Reviewed: 10/06/2012 °[employer]® Patient Information  ©2015 [employer]. This information is not intended to replace advice given to you by your health care provider. Make sure you discuss any questions you have with your health care provider. ° °

## 2014-12-12 NOTE — H&P (View-Only) (Signed)
CARDIOLOGY OFFICE NOTE  Date:  12/10/2014    [name] Date of Birth: October 13, 1970 Medical Record [id number]  PCP:  Sandi Mariscal, MD  Cardiologist:  Caryl Comes    Chief Complaint  Patient presents with  . Post hospital for hypotension with NICM    Seen for Dr. Caryl Comes    History of Present Illness: [name] is a 44 y.o. male who presents today for a post hospital visit. Seen for Dr. Caryl Comes. He has a NICM with ICD placement in 1999 with EF of 20-25%, afib, HTN, and prior DVT history.  Last seen here in the office in September of last year.   He presented earlier this month to the ER with hypotension and near syncope. He had had diarrhea. In the ER, he was found to be hypotensive with BPS in the 90s/50s, tachycardic, and in afib with irregularly irregular rhythm and was receiving fluid bolus of NS. CTA was ordered given concern for aortic pathology - this was negative, troponins were trended and and 1st one negative and his lactic acid was mildly elevated. EKG showed afib.  He was hydrated. Medicines were held except for Coreg. Remained in AF. Diuretics restarted due to volume excess.  Continued on Eliquis. He was not sent home on his ACE. Had abdominal films and CT abdomen and chest with no acute findings. He has had prior cholecystectomy.  Comes back today. Here alone. Nauseated. No vomiting. This started back yesterday. When he first got home he felt fine. Now, no appetite. Not dizzy. Some shortness of breath - can't lie flat - sleeping sitting up. No swelling but belly is bloated.  His dose of Lasix was increased to BID up until Saturday. He felt like he was doing better while he was on the higher dose. No chest pain. No more diarrhea.   Past Medical History  Diagnosis Date  . Chronic systolic heart failure     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%)  . Atrial fibrillation -parosysmal      Rx w amiodarone  . Noncompliance     H/O  MEDICAL NONCOMPLIANCE  .  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 Tricuspid valve regurgitation     SEVERE  . Severe mitral regurgitation   . Polymorphic ventricular tachycardia     RECURRENT WITH APPROPRIATE SHOCK THERAPY IN THE PAST  . Ventricular fibrillation     WITH APPROPRIATE SHOCK THERAPY IN THE PAST  . Hypertension   . Gout   . RA (rheumatoid arthritis)   . Automatic implantable cardiac defibrillator -BSX     single chamber  . GI bleed -massive     11 Units 2012  . Elevated LFTs   . H/O hyperthyroidism   . CHF (congestive heart failure)   . AKI (acute kidney injury)     Past Surgical History  Procedure Laterality Date  . Cardiac catheterization  06/2006    RIGHT HEART CATH SHOWING SEVERE BIVENTRICUALR CHF WITH MARKED FILLING AND PRESSURES  . Insert / replace / remove pacemaker      GUIDANT HE ICD MODEL 2180, SERIAL # [id number]  . Cholecystectomy    . Implantable cardioverter defibrillator generator change N/A 07/01/2011    Procedure: IMPLANTABLE CARDIOVERTER DEFIBRILLATOR GENERATOR CHANGE;  Surgeon: Deboraha Sprang, MD;  Location: [facility] CATH LAB;  Service: Cardiovascular;  Laterality: N/A;     Medications: Current Outpatient Prescriptions  Medication Sig Dispense Refill  . allopurinol (  ZYLOPRIM) 100 MG tablet Take 100 mg by mouth daily.  0  . apixaban (ELIQUIS) 5 MG TABS tablet Take 1 tablet (5 mg total) by mouth 2 (two) times daily. 60 tablet 3  . carvedilol (COREG) 25 MG tablet Take 1 tablet (25 mg total) by mouth 2 (two) times daily with a meal. 60 tablet 1  . furosemide (LASIX) 40 MG tablet Take 1 tablet (40 mg total) by mouth 2 (two) times daily. 30 tablet 10  . spironolactone (ALDACTONE) 25 MG tablet Take 1 tablet (25 mg total) by mouth daily. 30 tablet 5   No current facility-administered medications for this visit.    Allergies: No Known Allergies  Social History: The patient  reports that he has never smoked. He has never used smokeless  tobacco. He reports that he does not drink alcohol or use illicit drugs.   Family History: The patient's family history includes Heart failure in his brother.   Review of Systems: Please see the history of present illness.   Otherwise, the review of systems is positive for none.   All other systems are reviewed and negative.   Physical Exam: VS:  BP 110/70 mmHg  Pulse 119  Temp(Src) 97.2 F (36.2 C)  Ht 5\' 7"  (1.702 m)  Wt 179 lb 9.6 oz (81.466 kg)  BMI 28.12 kg/m2 .  BMI Body mass index is 28.12 kg/(m^2).  Wt Readings from Last 3 Encounters:  12/10/14 179 lb 9.6 oz (81.466 kg)  12/05/14 185 lb 3 oz (84 kg)  11/09/14 185 lb (83.915 kg)    General: Pleasant. He is alert but looks like he feels puny but in no acute distress. Weight is down 6 pounds.  HEENT: Normal. Neck: Supple, his neck veins are flat, no carotid bruits or masses noted.  Cardiac: Irregular irregular rate and rhythm. III/VI systolic murmur at the apex noted. No edema.  Respiratory:  Lungs are clear to auscultation bilaterally with normal work of breathing.  GI: Soft and nontender. Belly is protruberant.  MS: No deformity or atrophy. Gait and ROM intact. Skin: Warm and dry. Color is normal.  Neuro:  Strength and sensation are intact and no gross focal deficits noted.  Psych: Alert, appropriate and with normal affect.   LABORATORY DATA:  EKG:  EKG is ordered today. This demonstrates AF with RVR.  Lab Results  Component Value Date   WBC 6.5 12/03/2014   HGB 13.7 12/03/2014   HCT 42.3 12/03/2014   PLT 157 12/03/2014   GLUCOSE 91 12/03/2014   CHOL  02/05/2009    136        ATP III CLASSIFICATION:  <200     mg/dL   Desirable  200-239  mg/dL   Borderline High  >=240    mg/dL   High          TRIG 187* 02/05/2009   HDL 30* 02/05/2009   LDLCALC  02/05/2009    69        Total Cholesterol/HDL:CHD Risk Coronary Heart Disease Risk Table                     Men   Women  1/2 Average Risk   3.4   3.3   Average Risk       5.0   4.4  2 X Average Risk   9.6   7.1  3 X Average Risk  23.4   11.0        Use the calculated Patient  Ratio above and the CHD Risk Table to determine the patient's CHD Risk.        ATP III CLASSIFICATION (LDL):  <100     mg/dL   Optimal  100-129  mg/dL   Near or Above                    Optimal  130-159  mg/dL   Borderline  160-189  mg/dL   High  >190     mg/dL   Very High   ALT 18 11/30/2014   AST 22 11/30/2014   NA 134* 12/03/2014   K 4.4 12/03/2014   CL 103 12/03/2014   CREATININE 1.99* 12/03/2014   BUN 38* 12/03/2014   CO2 22 12/03/2014   TSH 1.756 12/01/2014   INR 1.68* 11/30/2014    BNP (last 3 results)  Recent Labs  11/09/14 0740 11/30/14 1325  BNP 534.3* 857.9*    ProBNP (last 3 results) No results for input(s): PROBNP in the last 8760 hours.   Other Studies Reviewed Today:  Echo Study Conclusions from 11/2014  - Left ventricle: The cavity size was moderately dilated. Wall thickness was increased in a pattern of moderate LVH. Systolic function was severely reduced. The estimated ejection fraction was in the range of 20% to 25%. Dyskinesis of the apicalapical myocardium. - Aortic valve: There was trivial regurgitation. - Mitral valve: There was moderate to severe regurgitation. - Left atrium: The atrium was moderately to severely dilated. - Right ventricle: The cavity size was mildly dilated. - Right atrium: The atrium was severely dilated. - Tricuspid valve: There was moderate regurgitation. - Pulmonary arteries: Systolic pressure was mildly increased. PA peak pressure: 36 mm Hg (S). - Pericardium, extracardiac: A trivial pericardial effusion was identified  Assessment/Plan: 1. NICM - EF is 20 to 25%. Now with more MR noted. He has more symptoms of heart failure with increasing PND, orthopnea and abdominal bloating/nausea.  I suspect that it is his heart failure that is driving his symptoms. Will arrange for TEE.  Increase Lasix back to BID. Check lab today and will hold on restarting his ACE for now. I think he would benefit from CHF referral and discussion of possible advanced therapies.  He has had his cardiomyopathy for many years. Discussed with Dr. Angelena Form (DOD) and he is in agreement with this plan of care. Will place him out of work for now while evaluation proceeds.   2. Recent episode of hypotension/diarrhea  3. AF - rate is up today but he has not been able to take his medicines today due to nausea.   4. Chronic anticoagulation  Current medicines are reviewed with the patient today.  The patient does not have concerns regarding medicines other than what has been noted above.  The following changes have been made:  See above.  Labs/ tests ordered today include:    Orders Placed This Encounter  Procedures  . Brain natriuretic peptide  . Basic metabolic panel  . CBC  . Hepatic function panel  . Amylase  . Lipase  . EKG 12-Lead     Disposition:   FU with Dr. Caryl Comes as planned.  Arranging CHF referral as well.   Patient is agreeable to this plan and will call if any problems develop in the interim.   Signed: Burtis Junes, RN, ANP-C 12/10/2014 11:54 AM  [facility] [address] [place], [place]  [address] Phone: [phone] Fax: [phone]

## 2014-12-12 NOTE — Telephone Encounter (Signed)
Left message for Kevan Rosebush, RN, to change pt's appointment to Friday, August 26 per Dr. Missy Sabins.  [name], NP, s/w Dr. Sung Amabile today about changing appointment due to pt's  TEE results  today.  Still has not been changed

## 2014-12-12 NOTE — Interval H&P Note (Signed)
History and Physical Interval Note:  12/12/2014 8:52 AM  [name]  has presented today for surgery, with the diagnosis of mitro regurgitation  The various methods of treatment have been discussed with the patient and family. After consideration of risks, benefits and other options for treatment, the patient has consented to  Procedure(s): TRANSESOPHAGEAL ECHOCARDIOGRAM (TEE) (N/A) as a surgical intervention .  The patient's history has been reviewed, patient examined, no change in status, stable for surgery.  I have reviewed the patient's chart and labs.  Questions were answered to the patient's satisfaction.     [name]

## 2014-12-12 NOTE — CV Procedure (Signed)
    TEE  Evaluate MR  Findings:  - Severe mitral regurgitation (PISA radius 1.0cm)  - Pulmonary vein flow reversal noted.   - EF 20%, dilated LA and MV annulus  - Severe TR  - ICD in place  Impression   - Severe MR from malcoaptation as a result of dilated annulus.   - Discussed with family.   Candee Furbish, MD

## 2014-12-13 ENCOUNTER — Encounter (HOSPITAL_COMMUNITY): Payer: Self-pay | Admitting: Cardiology

## 2014-12-14 ENCOUNTER — Inpatient Hospital Stay (HOSPITAL_COMMUNITY)
Admission: AD | Admit: 2014-12-14 | Discharge: 2015-01-08 | DRG: 001 | Disposition: A | Discharge status: Home Health | Payer: 59 | Source: Ambulatory Visit | Attending: Surgery | Admitting: Surgery

## 2014-12-14 ENCOUNTER — Encounter (HOSPITAL_COMMUNITY)
Admission: AD | Disposition: A | Discharge status: Home Health | Payer: Self-pay | Source: Ambulatory Visit | Attending: Surgery

## 2014-12-14 ENCOUNTER — Ambulatory Visit (HOSPITAL_BASED_OUTPATIENT_CLINIC_OR_DEPARTMENT_OTHER)
Admission: RE | Admit: 2014-12-14 | Discharge: 2014-12-14 | Disposition: A | Discharge status: Home / Self Care | Payer: 59 | Source: Ambulatory Visit | Attending: Cardiology | Admitting: Cardiology

## 2014-12-14 ENCOUNTER — Ambulatory Visit (HOSPITAL_COMMUNITY): Admission: AD | Admit: 2014-12-14 | Payer: Self-pay | Admitting: Internal Medicine

## 2014-12-14 ENCOUNTER — Other Ambulatory Visit (HOSPITAL_COMMUNITY): Payer: Self-pay | Admitting: Student

## 2014-12-14 ENCOUNTER — Encounter (HOSPITAL_COMMUNITY): Payer: Self-pay

## 2014-12-14 VITALS — BP 96/52 | HR 196 | Ht 67.0 in | Wt 176.8 lb

## 2014-12-14 DIAGNOSIS — Z452 Encounter for adjustment and management of vascular access device: Secondary | ICD-10-CM

## 2014-12-14 DIAGNOSIS — R110 Nausea: Secondary | ICD-10-CM | POA: Diagnosis not present

## 2014-12-14 DIAGNOSIS — Z7901 Long term (current) use of anticoagulants: Secondary | ICD-10-CM | POA: Diagnosis not present

## 2014-12-14 DIAGNOSIS — M069 Rheumatoid arthritis, unspecified: Secondary | ICD-10-CM | POA: Diagnosis present

## 2014-12-14 DIAGNOSIS — M25551 Pain in right hip: Secondary | ICD-10-CM | POA: Diagnosis not present

## 2014-12-14 DIAGNOSIS — R042 Hemoptysis: Secondary | ICD-10-CM | POA: Diagnosis not present

## 2014-12-14 DIAGNOSIS — R0602 Shortness of breath: Secondary | ICD-10-CM

## 2014-12-14 DIAGNOSIS — R040 Epistaxis: Secondary | ICD-10-CM | POA: Diagnosis not present

## 2014-12-14 DIAGNOSIS — I129 Hypertensive chronic kidney disease with stage 1 through stage 4 chronic kidney disease, or unspecified chronic kidney disease: Secondary | ICD-10-CM | POA: Diagnosis present

## 2014-12-14 DIAGNOSIS — E1140 Type 2 diabetes mellitus with diabetic neuropathy, unspecified: Secondary | ICD-10-CM | POA: Diagnosis not present

## 2014-12-14 DIAGNOSIS — M109 Gout, unspecified: Secondary | ICD-10-CM | POA: Diagnosis present

## 2014-12-14 DIAGNOSIS — I482 Chronic atrial fibrillation, unspecified: Secondary | ICD-10-CM | POA: Insufficient documentation

## 2014-12-14 DIAGNOSIS — R570 Cardiogenic shock: Secondary | ICD-10-CM | POA: Diagnosis not present

## 2014-12-14 DIAGNOSIS — Z95811 Presence of heart assist device: Secondary | ICD-10-CM

## 2014-12-14 DIAGNOSIS — F419 Anxiety disorder, unspecified: Secondary | ICD-10-CM | POA: Diagnosis not present

## 2014-12-14 DIAGNOSIS — I428 Other cardiomyopathies: Secondary | ICD-10-CM | POA: Diagnosis present

## 2014-12-14 DIAGNOSIS — Z8249 Family history of ischemic heart disease and other diseases of the circulatory system: Secondary | ICD-10-CM

## 2014-12-14 DIAGNOSIS — R079 Chest pain, unspecified: Secondary | ICD-10-CM

## 2014-12-14 DIAGNOSIS — I472 Ventricular tachycardia: Secondary | ICD-10-CM | POA: Diagnosis not present

## 2014-12-14 DIAGNOSIS — E1122 Type 2 diabetes mellitus with diabetic chronic kidney disease: Secondary | ICD-10-CM | POA: Diagnosis present

## 2014-12-14 DIAGNOSIS — J189 Pneumonia, unspecified organism: Secondary | ICD-10-CM | POA: Diagnosis not present

## 2014-12-14 DIAGNOSIS — Z86718 Personal history of other venous thrombosis and embolism: Secondary | ICD-10-CM

## 2014-12-14 DIAGNOSIS — Z515 Encounter for palliative care: Secondary | ICD-10-CM | POA: Diagnosis not present

## 2014-12-14 DIAGNOSIS — I071 Rheumatic tricuspid insufficiency: Secondary | ICD-10-CM | POA: Diagnosis present

## 2014-12-14 DIAGNOSIS — K913 Postprocedural intestinal obstruction: Secondary | ICD-10-CM | POA: Diagnosis not present

## 2014-12-14 DIAGNOSIS — N170 Acute kidney failure with tubular necrosis: Secondary | ICD-10-CM | POA: Diagnosis not present

## 2014-12-14 DIAGNOSIS — Z01818 Encounter for other preprocedural examination: Secondary | ICD-10-CM

## 2014-12-14 DIAGNOSIS — I509 Heart failure, unspecified: Secondary | ICD-10-CM

## 2014-12-14 DIAGNOSIS — R791 Abnormal coagulation profile: Secondary | ICD-10-CM | POA: Diagnosis not present

## 2014-12-14 DIAGNOSIS — Z9119 Patient's noncompliance with other medical treatment and regimen: Secondary | ICD-10-CM | POA: Diagnosis present

## 2014-12-14 DIAGNOSIS — I4892 Unspecified atrial flutter: Secondary | ICD-10-CM | POA: Diagnosis not present

## 2014-12-14 DIAGNOSIS — Z79899 Other long term (current) drug therapy: Secondary | ICD-10-CM | POA: Diagnosis not present

## 2014-12-14 DIAGNOSIS — N189 Chronic kidney disease, unspecified: Secondary | ICD-10-CM | POA: Diagnosis not present

## 2014-12-14 DIAGNOSIS — M25552 Pain in left hip: Secondary | ICD-10-CM | POA: Diagnosis not present

## 2014-12-14 DIAGNOSIS — Z8674 Personal history of sudden cardiac arrest: Secondary | ICD-10-CM

## 2014-12-14 DIAGNOSIS — N179 Acute kidney failure, unspecified: Secondary | ICD-10-CM

## 2014-12-14 DIAGNOSIS — Z95810 Presence of automatic (implantable) cardiac defibrillator: Secondary | ICD-10-CM | POA: Diagnosis not present

## 2014-12-14 DIAGNOSIS — I97110 Postprocedural cardiac insufficiency following cardiac surgery: Secondary | ICD-10-CM

## 2014-12-14 DIAGNOSIS — R0600 Dyspnea, unspecified: Secondary | ICD-10-CM | POA: Diagnosis present

## 2014-12-14 DIAGNOSIS — I5022 Chronic systolic (congestive) heart failure: Secondary | ICD-10-CM | POA: Diagnosis not present

## 2014-12-14 DIAGNOSIS — D62 Acute posthemorrhagic anemia: Secondary | ICD-10-CM | POA: Diagnosis not present

## 2014-12-14 DIAGNOSIS — R140 Abdominal distension (gaseous): Secondary | ICD-10-CM

## 2014-12-14 DIAGNOSIS — I340 Nonrheumatic mitral (valve) insufficiency: Secondary | ICD-10-CM | POA: Diagnosis present

## 2014-12-14 DIAGNOSIS — Z7682 Awaiting organ transplant status: Secondary | ICD-10-CM

## 2014-12-14 DIAGNOSIS — Z01810 Encounter for preprocedural cardiovascular examination: Secondary | ICD-10-CM | POA: Diagnosis not present

## 2014-12-14 DIAGNOSIS — I5023 Acute on chronic systolic (congestive) heart failure: Secondary | ICD-10-CM | POA: Diagnosis not present

## 2014-12-14 DIAGNOSIS — N184 Chronic kidney disease, stage 4 (severe): Secondary | ICD-10-CM | POA: Diagnosis present

## 2014-12-14 DIAGNOSIS — O26899 Other specified pregnancy related conditions, unspecified trimester: Secondary | ICD-10-CM

## 2014-12-14 DIAGNOSIS — I2589 Other forms of chronic ischemic heart disease: Secondary | ICD-10-CM | POA: Diagnosis not present

## 2014-12-14 DIAGNOSIS — R109 Unspecified abdominal pain: Secondary | ICD-10-CM

## 2014-12-14 DIAGNOSIS — I429 Cardiomyopathy, unspecified: Secondary | ICD-10-CM | POA: Diagnosis not present

## 2014-12-14 DIAGNOSIS — I313 Pericardial effusion (noninflammatory): Secondary | ICD-10-CM | POA: Diagnosis not present

## 2014-12-14 HISTORY — PX: CARDIAC CATHETERIZATION: SHX172

## 2014-12-14 HISTORY — DX: Presence of automatic (implantable) cardiac defibrillator: Z95.810

## 2014-12-14 LAB — CBC WITH DIFFERENTIAL/PLATELET
BASOS ABS: 0.1 10*3/uL (ref 0.0–0.1)
Basophils Relative: 1 % (ref 0–1)
EOS PCT: 1 % (ref 0–5)
Eosinophils Absolute: 0.1 10*3/uL (ref 0.0–0.7)
HEMATOCRIT: 42 % (ref 39.0–52.0)
Hemoglobin: 13.8 g/dL (ref 13.0–17.0)
LYMPHS ABS: 2.3 10*3/uL (ref 0.7–4.0)
LYMPHS PCT: 37 % (ref 12–46)
MCH: 22.2 pg — ABNORMAL LOW (ref 26.0–34.0)
MCHC: 32.9 g/dL (ref 30.0–36.0)
MCV: 67.4 fL — AB (ref 78.0–100.0)
MONOS PCT: 12 % (ref 3–12)
Monocytes Absolute: 0.7 10*3/uL (ref 0.1–1.0)
NEUTROS PCT: 49 % (ref 43–77)
Neutro Abs: 2.9 10*3/uL (ref 1.7–7.7)
Platelets: 235 10*3/uL (ref 150–400)
RBC: 6.23 MIL/uL — AB (ref 4.22–5.81)
RDW: 16.7 % — AB (ref 11.5–15.5)
WBC: 6.1 10*3/uL (ref 4.0–10.5)

## 2014-12-14 LAB — POCT I-STAT 3, VENOUS BLOOD GAS (G3P V)
ACID-BASE DEFICIT: 2 mmol/L (ref 0.0–2.0)
Acid-Base Excess: 1 mmol/L (ref 0.0–2.0)
Acid-base deficit: 2 mmol/L (ref 0.0–2.0)
BICARBONATE: 25.4 meq/L — AB (ref 20.0–24.0)
BICARBONATE: 22.2 meq/L (ref 20.0–24.0)
Bicarbonate: 23.4 mEq/L (ref 20.0–24.0)
O2 SAT: 20 %
O2 SAT: 54 %
O2 Saturation: 25 %
PCO2 VEN: 40.4 mmHg — AB (ref 45.0–50.0)
PCO2 VEN: 40.5 mmHg — AB (ref 45.0–50.0)
PH VEN: 7.406 — AB (ref 7.250–7.300)
PO2 VEN: 16 mmHg — AB (ref 30.0–45.0)
PO2 VEN: 17 mmHg — AB (ref 30.0–45.0)
PO2 VEN: 29 mmHg — AB (ref 30.0–45.0)
TCO2: 25 mmol/L (ref 0–100)
TCO2: 27 mmol/L (ref 0–100)
TCO2: 23 mmol/L (ref 0–100)
pCO2, Ven: 36.6 mmHg — ABNORMAL LOW (ref 45.0–50.0)
pH, Ven: 7.371 — ABNORMAL HIGH (ref 7.250–7.300)
pH, Ven: 7.39 — ABNORMAL HIGH (ref 7.250–7.300)

## 2014-12-14 LAB — COMPREHENSIVE METABOLIC PANEL
ALBUMIN: 3.5 g/dL (ref 3.5–5.0)
ALK PHOS: 89 U/L (ref 38–126)
ALT: 36 U/L (ref 17–63)
ALT: 36 U/L (ref 17–63)
ANION GAP: 11 (ref 5–15)
AST: 25 U/L (ref 15–41)
AST: 26 U/L (ref 15–41)
Albumin: 3.8 g/dL (ref 3.5–5.0)
Alkaline Phosphatase: 88 U/L (ref 38–126)
Anion gap: 10 (ref 5–15)
BILIRUBIN TOTAL: 1.1 mg/dL (ref 0.3–1.2)
BUN: 41 mg/dL — ABNORMAL HIGH (ref 6–20)
BUN: 43 mg/dL — AB (ref 6–20)
CALCIUM: 9.3 mg/dL (ref 8.9–10.3)
CHLORIDE: 101 mmol/L (ref 101–111)
CO2: 22 mmol/L (ref 22–32)
CO2: 25 mmol/L (ref 22–32)
CREATININE: 2.33 mg/dL — AB (ref 0.61–1.24)
CREATININE: 2.41 mg/dL — AB (ref 0.61–1.24)
Calcium: 9.1 mg/dL (ref 8.9–10.3)
Chloride: 103 mmol/L (ref 101–111)
GFR calc Af Amer: 35 mL/min — ABNORMAL LOW (ref >60)
GFR, EST AFRICAN AMERICAN: 37 mL/min — AB (ref >60)
GFR, EST NON AFRICAN AMERICAN: 32 mL/min — AB (ref >60)
GFR, EST NON AFRICAN AMERICAN: 31 mL/min — AB (ref >60)
GLUCOSE: 104 mg/dL — AB (ref 65–99)
Glucose, Bld: 124 mg/dL — ABNORMAL HIGH (ref 65–99)
POTASSIUM: 5.2 mmol/L — AB (ref 3.5–5.1)
Potassium: 5 mmol/L (ref 3.5–5.1)
SODIUM: 136 mmol/L (ref 135–145)
Sodium: 136 mmol/L (ref 135–145)
TOTAL PROTEIN: 8.3 g/dL — AB (ref 6.5–8.1)
Total Bilirubin: 0.9 mg/dL (ref 0.3–1.2)
Total Protein: 7.7 g/dL (ref 6.5–8.1)

## 2014-12-14 LAB — TYPE AND SCREEN
ABO/RH(D): O POS
Antibody Screen: NEGATIVE

## 2014-12-14 LAB — HEPARIN LEVEL (UNFRACTIONATED): HEPARIN UNFRACTIONATED: 1.97 [IU]/mL — AB (ref 0.30–0.70)

## 2014-12-14 LAB — APTT: aPTT: 39 seconds — ABNORMAL HIGH (ref 24–37)

## 2014-12-14 LAB — CBC
HCT: 43.3 % (ref 39.0–52.0)
Hemoglobin: 13.8 g/dL (ref 13.0–17.0)
MCH: 21.8 pg — ABNORMAL LOW (ref 26.0–34.0)
MCHC: 31.9 g/dL (ref 30.0–36.0)
MCV: 68.5 fL — AB (ref 78.0–100.0)
PLATELETS: 232 10*3/uL (ref 150–400)
RBC: 6.32 MIL/uL — AB (ref 4.22–5.81)
RDW: 16.9 % — AB (ref 11.5–15.5)
WBC: 6.4 10*3/uL (ref 4.0–10.5)

## 2014-12-14 LAB — PROTIME-INR
INR: 2.23 — AB (ref 0.00–1.49)
PROTHROMBIN TIME: 24.5 s — AB (ref 11.6–15.2)

## 2014-12-14 LAB — CARBOXYHEMOGLOBIN
Carboxyhemoglobin: 1.2 % (ref 0.5–1.5)
Methemoglobin: 1.3 % (ref 0.0–1.5)
O2 Saturation: 60.9 %
Total hemoglobin: 13.2 g/dL — ABNORMAL LOW (ref 13.5–18.0)

## 2014-12-14 LAB — BRAIN NATRIURETIC PEPTIDE
B NATRIURETIC PEPTIDE 5: 1341.9 pg/mL — AB (ref 0.0–100.0)
B NATRIURETIC PEPTIDE 5: 1473.6 pg/mL — AB (ref 0.0–100.0)

## 2014-12-14 LAB — MRSA PCR SCREENING: MRSA by PCR: NEGATIVE

## 2014-12-14 LAB — TSH: TSH: 6.312 u[IU]/mL — AB (ref 0.350–4.500)

## 2014-12-14 SURGERY — RIGHT HEART CATH
Anesthesia: LOCAL

## 2014-12-14 MED ORDER — HEPARIN SODIUM (PORCINE) 1000 UNIT/ML IJ SOLN
INTRAMUSCULAR | Status: DC | PRN
  Administered 2014-12-14: 2000 [IU] via INTRAVENOUS

## 2014-12-14 MED ORDER — ONDANSETRON HCL 4 MG/2ML IJ SOLN
INTRAMUSCULAR | Status: DC | PRN
Start: 2014-12-14 — End: 2014-12-14
  Administered 2014-12-14: 4 mg via INTRAVENOUS

## 2014-12-14 MED ORDER — NOREPINEPHRINE BITARTRATE 1 MG/ML IV SOLN
0.0000 ug/min | INTRAVENOUS | Status: DC
  Administered 2014-12-14: 5 ug/min via INTRAVENOUS
  Administered 2014-12-15 – 2014-12-18 (×3): 10 ug/min via INTRAVENOUS
  Filled 2014-12-14 (×5): qty 16

## 2014-12-14 MED ORDER — SODIUM CHLORIDE 0.9 % IV SOLN
250.0000 mL | INTRAVENOUS | Status: DC | PRN
  Administered 2014-12-17 – 2014-12-18 (×2): 250 mL via INTRAVENOUS

## 2014-12-14 MED ORDER — SODIUM CHLORIDE 0.9 % IJ SOLN
3.0000 mL | Freq: Two times a day (BID) | INTRAMUSCULAR | Status: DC
  Administered 2014-12-14: 3 mL via INTRAVENOUS
  Administered 2014-12-15: 10:00:00 via INTRAVENOUS
  Administered 2014-12-15: 3 mL via INTRAVENOUS
  Administered 2014-12-16: 09:00:00 via INTRAVENOUS
  Administered 2014-12-18 – 2014-12-19 (×2): 3 mL via INTRAVENOUS

## 2014-12-14 MED ORDER — SODIUM CHLORIDE 0.9 % IV SOLN: INTRAVENOUS | Status: DC

## 2014-12-14 MED ORDER — LIDOCAINE HCL (PF) 1 % IJ SOLN
INTRAMUSCULAR | Status: DC | PRN
  Administered 2014-12-14: 17:00:00

## 2014-12-14 MED ORDER — MILRINONE IN DEXTROSE 20 MG/100ML IV SOLN
0.2500 ug/kg/min | INTRAVENOUS | Status: DC
  Administered 2014-12-15 – 2014-12-23 (×17): 0.375 ug/kg/min via INTRAVENOUS
  Administered 2014-12-24: 0.25 ug/kg/min via INTRAVENOUS
  Administered 2014-12-24: 0.375 ug/kg/min via INTRAVENOUS
  Administered 2014-12-25: 0.25 ug/kg/min via INTRAVENOUS
  Administered 2014-12-26 – 2014-12-29 (×3): 0.125 ug/kg/min via INTRAVENOUS
  Administered 2014-12-31 – 2015-01-08 (×11): 0.25 ug/kg/min via INTRAVENOUS
  Filled 2014-12-14 (×35): qty 100

## 2014-12-14 MED ORDER — MILRINONE IN DEXTROSE 20 MG/100ML IV SOLN
0.1250 ug/kg/min | INTRAVENOUS | Status: AC
  Administered 2014-12-14: 0.375 ug/kg/min via INTRAVENOUS
  Filled 2014-12-14: qty 100

## 2014-12-14 MED ORDER — NOREPINEPHRINE BITARTRATE 1 MG/ML IV SOLN
INTRAVENOUS | Status: AC
  Filled 2014-12-14: qty 4

## 2014-12-14 MED ORDER — FUROSEMIDE 10 MG/ML IJ SOLN
INTRAMUSCULAR | Status: DC | PRN
  Administered 2014-12-14: 80 mg via INTRAVENOUS

## 2014-12-14 MED ORDER — LIDOCAINE HCL (PF) 1 % IJ SOLN
INTRAMUSCULAR | Status: AC
  Filled 2014-12-14: qty 30

## 2014-12-14 MED ORDER — SODIUM CHLORIDE 0.9 % IJ SOLN
3.0000 mL | Freq: Two times a day (BID) | INTRAMUSCULAR | Status: DC
  Administered 2014-12-14 – 2014-12-15 (×2): 3 mL via INTRAVENOUS
  Administered 2014-12-15 – 2014-12-16 (×2): via INTRAVENOUS
  Administered 2014-12-18 – 2014-12-19 (×2): 3 mL via INTRAVENOUS

## 2014-12-14 MED ORDER — HEPARIN SODIUM (PORCINE) 1000 UNIT/ML IJ SOLN
INTRAMUSCULAR | Status: AC
  Filled 2014-12-14: qty 1

## 2014-12-14 MED ORDER — ACETAMINOPHEN 325 MG PO TABS
650.0000 mg | ORAL_TABLET | ORAL | Status: DC | PRN
  Administered 2014-12-15 – 2014-12-20 (×6): 650 mg via ORAL
  Filled 2014-12-14 (×6): qty 2

## 2014-12-14 MED ORDER — PNEUMOCOCCAL VAC POLYVALENT 25 MCG/0.5ML IJ INJ
0.5000 mL | INJECTION | INTRAMUSCULAR | Status: DC
  Filled 2014-12-14: qty 0.5

## 2014-12-14 MED ORDER — SODIUM CHLORIDE 0.9 % IJ SOLN
3.0000 mL | Freq: Two times a day (BID) | INTRAMUSCULAR | Status: DC
  Administered 2014-12-14 – 2014-12-19 (×4): 3 mL via INTRAVENOUS

## 2014-12-14 MED ORDER — SODIUM CHLORIDE 0.9 % IV SOLN: 250.0000 mL | INTRAVENOUS | Status: DC | PRN

## 2014-12-14 MED ORDER — NOREPINEPHRINE BITARTRATE 1 MG/ML IV SOLN
0.0000 ug/min | INTRAVENOUS | Status: DC
  Filled 2014-12-14: qty 4

## 2014-12-14 MED ORDER — SODIUM CHLORIDE 0.9 % IV SOLN
250.0000 mL | INTRAVENOUS | Status: DC | PRN
  Administered 2014-12-15 – 2014-12-16 (×2): 250 mL via INTRAVENOUS

## 2014-12-14 MED ORDER — HEPARIN (PORCINE) IN NACL 2-0.9 UNIT/ML-% IJ SOLN
INTRAMUSCULAR | Status: AC
  Filled 2014-12-14: qty 1000

## 2014-12-14 MED ORDER — IOHEXOL 350 MG/ML SOLN
INTRAVENOUS | Status: DC | PRN
  Administered 2014-12-14: 5 mL via INTRACARDIAC

## 2014-12-14 MED ORDER — SODIUM CHLORIDE 0.9 % IJ SOLN: 3.0000 mL | INTRAMUSCULAR | Status: DC | PRN

## 2014-12-14 MED ORDER — ONDANSETRON HCL 4 MG/2ML IJ SOLN
INTRAMUSCULAR | Status: AC
  Filled 2014-12-14: qty 2

## 2014-12-14 MED ORDER — ACETAMINOPHEN 325 MG PO TABS: 650.0000 mg | ORAL_TABLET | ORAL | Status: DC | PRN

## 2014-12-14 MED ORDER — HEPARIN (PORCINE) IN NACL 100-0.45 UNIT/ML-% IJ SOLN
1000.0000 [IU]/h | INTRAMUSCULAR | Status: DC
  Administered 2014-12-14: 800 [IU]/h via INTRAVENOUS
  Administered 2014-12-15: 950 [IU]/h via INTRAVENOUS
  Administered 2014-12-17: 1100 [IU]/h via INTRAVENOUS
  Administered 2014-12-18 – 2014-12-20 (×3): 1000 [IU]/h via INTRAVENOUS
  Filled 2014-12-14 (×7): qty 250

## 2014-12-14 MED ORDER — SODIUM CHLORIDE 0.9 % IJ SOLN
3.0000 mL | Freq: Two times a day (BID) | INTRAMUSCULAR | Status: DC
  Administered 2014-12-14: 3 mL via INTRAVENOUS

## 2014-12-14 MED ORDER — DEXTROSE 5 % IV SOLN
4.0000 mg | INTRAVENOUS | Status: DC | PRN
  Administered 2014-12-14: 5 ug/min via INTRAVENOUS

## 2014-12-14 MED ORDER — FUROSEMIDE 10 MG/ML IJ SOLN
80.0000 mg | Freq: Two times a day (BID) | INTRAMUSCULAR | Status: DC
  Administered 2014-12-15 – 2014-12-16 (×3): 80 mg via INTRAVENOUS
  Filled 2014-12-14 (×3): qty 8

## 2014-12-14 MED ORDER — ONDANSETRON HCL 4 MG/2ML IJ SOLN: 4.0000 mg | Freq: Four times a day (QID) | INTRAMUSCULAR | Status: DC | PRN

## 2014-12-14 MED ORDER — ONDANSETRON HCL 4 MG/2ML IJ SOLN
4.0000 mg | Freq: Four times a day (QID) | INTRAMUSCULAR | Status: DC | PRN
  Administered 2014-12-14: 4 mg via INTRAVENOUS

## 2014-12-14 MED ORDER — SODIUM CHLORIDE 0.9 % IJ SOLN
3.0000 mL | INTRAMUSCULAR | Status: DC | PRN
  Administered 2014-12-18 (×2): 3 mL via INTRAVENOUS
  Filled 2014-12-14 (×2): qty 3

## 2014-12-14 MED ORDER — SODIUM CHLORIDE 0.9 % IV SOLN
INTRAVENOUS | Status: DC | PRN
  Administered 2014-12-14: 10 mL/h via INTRAVENOUS

## 2014-12-14 MED ORDER — ONDANSETRON HCL 4 MG/2ML IJ SOLN
INTRAMUSCULAR | Status: AC
  Administered 2014-12-14: 4 mg via INTRAVENOUS
  Filled 2014-12-14: qty 2

## 2014-12-14 MED ORDER — LIDOCAINE HCL (PF) 1 % IJ SOLN: INTRAMUSCULAR | Status: DC | PRN

## 2014-12-14 MED ORDER — FUROSEMIDE 10 MG/ML IJ SOLN
INTRAMUSCULAR | Status: AC
  Filled 2014-12-14: qty 8

## 2014-12-14 MED ORDER — INFLUENZA VAC SPLIT QUAD 0.5 ML IM SUSY
0.5000 mL | PREFILLED_SYRINGE | INTRAMUSCULAR | Status: AC
  Administered 2014-12-15: 0.5 mL via INTRAMUSCULAR
  Filled 2014-12-14: qty 0.5

## 2014-12-14 SURGICAL SUPPLY — 17 items
BALLN LINEAR 7.5FR IABP 40CC (BALLOONS) ×2
BALLOON LINEAR 7.5FR IABP 40CC (BALLOONS) ×1 IMPLANT
CATH SWAN VIP NON-HEP 7.5F (CATHETERS) ×2 IMPLANT
DEVICE RAD COMP TR BAND LRG (VASCULAR PRODUCTS) ×2 IMPLANT
DEVICE SECURE STATLOCK IABP (MISCELLANEOUS) ×4 IMPLANT
KIT HEART LEFT (KITS) ×2 IMPLANT
PACK CARDIAC CATHETERIZATION (CUSTOM PROCEDURE TRAY) ×2 IMPLANT
PROTECTION STATION PRESSURIZED (MISCELLANEOUS) ×2
SHEATH PINNACLE 5F 10CM (SHEATH) ×2 IMPLANT
SHEATH PINNACLE 7F 10CM (SHEATH) IMPLANT
SHEATH PINNACLE 8F 10CM (SHEATH) ×2 IMPLANT
SLEEVE REPOSITIONING LENGTH 30 (MISCELLANEOUS) ×2 IMPLANT
STATION PROTECTION PRESSURIZED (MISCELLANEOUS) ×1 IMPLANT
TRANSDUCER W/STOPCOCK (MISCELLANEOUS) ×2 IMPLANT
TUBING ART PRESS 72  MALE/FEM (TUBING) ×1
TUBING ART PRESS 72 MALE/FEM (TUBING) ×1 IMPLANT
WIRE EMERALD 3MM-J .035X150CM (WIRE) ×2 IMPLANT

## 2014-12-14 NOTE — H&P (Signed)
Patient ID: [name], male DOB: May 11, 1970, 44 y.o. MRN: [id number]      Date: 12/14/2014    [name] Date of Birth: [birth year] Medical Record [id number]  PCP: [name] Mariscal, MD Cardiologist: [name]   History of Present Illness: [name] is a 45 y.o. 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  Presented in 1999 with SCD. Cath with normal cors and low EF. We saw him in 2008 and was admitted with low-output HF. Has not followed up with cardiology regularly except for Dr. Caryl [name] in [place].   Last week admitted with syncopal episode and hypotension and AKI in setting of diarrheal illness. Echo EF 20-25% moderate to severe MR and moderate TR. CTA was ordered given concern for aortic pathology - this was negative, troponins were trended and and 1st one negative and his lactic acid was mildly elevated. EKG showed afib.Subsequently had TEE with Dr. Marlou [name] and had severe central MR due to central jet.   Saw [name] on Monday felt nauseated and weak. Referred to HF Clinic. 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 Dyspneic with any exertion. + orthopneic. + bloated and ab [name]. No further diarrhea.  Lives alone. 2 sisters from [facility] are here with him .   Past Medical History  Diagnosis Date  . Chronic systolic heart failure     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%)  . Atrial fibrillation -parosysmal     Rx w amiodarone  . Noncompliance     H/O MEDICAL NONCOMPLIANCE  . 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 Tricuspid valve regurgitation     SEVERE  . Severe mitral regurgitation   . Polymorphic ventricular tachycardia     RECURRENT WITH APPROPRIATE SHOCK THERAPY IN THE PAST  . Ventricular fibrillation     WITH APPROPRIATE SHOCK THERAPY IN THE  PAST  . Hypertension   . Gout   . RA (rheumatoid arthritis)   . Automatic implantable cardiac defibrillator -BSX     single chamber  . GI bleed -massive     11 Units 2012  . Elevated LFTs   . H/O hyperthyroidism   . CHF (congestive heart failure)   . AKI (acute kidney injury)     Past Surgical History  Procedure Laterality Date  . Cardiac catheterization  06/2006    RIGHT HEART CATH SHOWING SEVERE BIVENTRICUALR CHF WITH MARKED FILLING AND PRESSURES  . Insert / replace / remove pacemaker      GUIDANT HE ICD MODEL 2180, SERIAL # [id number]  . Cholecystectomy    . Implantable cardioverter defibrillator generator change N/A 07/01/2011    Procedure: IMPLANTABLE CARDIOVERTER DEFIBRILLATOR GENERATOR CHANGE; Surgeon: [name] Sprang, MD; Location: [facility] CATH LAB; Service: Cardiovascular; Laterality: N/A;  . Tee without cardioversion N/A 12/12/2014    Procedure: TRANSESOPHAGEAL ECHOCARDIOGRAM (TEE); Surgeon: [name] Pain, MD; Location: [facility] ENDOSCOPY; Service: Cardiovascular; Laterality: N/A;     Medications: Current Outpatient Prescriptions  Medication Sig Dispense Refill  . allopurinol (ZYLOPRIM) 100 MG tablet Take 100 mg by mouth daily.  0  . apixaban (ELIQUIS) 5 MG TABS tablet Take 1 tablet (5 mg total) by mouth 2 (two) times daily. 60 tablet 3  . carvedilol (COREG) 25 MG tablet Take 1 tablet (25 mg total) by mouth 2 (two) times daily with a meal. 60 tablet  1  . furosemide (LASIX) 40 MG tablet Take 1 tablet (40 mg total) by mouth 2 (two) times daily. 60 tablet 9  . spironolactone (ALDACTONE) 25 MG tablet Take 1 tablet (25 mg total) by mouth daily. 30 tablet 5   No current facility-administered medications for this encounter.    Allergies: No Known Allergies  Social History: The patient  reports that he has never smoked. He has never used smokeless tobacco. He reports that he  does not drink alcohol or use illicit drugs.  Family History: The patient's family history includes Heart failure in his brother.   Review of Systems: Please see the history of present illness. Otherwise, the review of systems is positive for none. All other systems are reviewed and negative.   Physical Exam: VS: BP 96/52 mmHg  Pulse 196  Ht 5\' 7"  (1.702 m)  Wt 176 lb 12.8 oz (80.196 kg)  BMI 27.68 kg/m2  SpO2 97% . BMI Body mass index is 27.68 kg/(m^2).  Wt Readings from Last 3 Encounters:  12/14/14 176 lb 12.8 oz (80.196 kg)  12/12/14 179 lb (81.194 kg)  12/10/14 179 lb 9.6 oz (81.466 kg)    General: Weak appearing NAD  HEENT: Normal.  Neck: Supple, JVP to jaw no carotid bruits or masses noted.  Cardiac: Irregular irregular rate and rhythm. III/VI systolic murmur at the apex noted. + s3. 1+ edema Respiratory: Lungs are clear to auscultation bilaterally with normal work of breathing.  GI: Soft and nontender. Belly distended MS: No deformity or atrophy. Gait and ROM intact.  Skin: Warm and dry. Color is normal.  Neuro: Strength and sensation are intact and no gross focal deficits noted.  Psych: Alert, appropriate and with normal affect.   LABORATORY DATA:  EKG: EKG is ordered today. This demonstrates AF with RVR.   Recent Labs    Lab Results  Component Value Date   WBC 7.8 12/10/2014   HGB 13.7 12/10/2014   HCT 46.1 12/10/2014   PLT 287.0 12/10/2014   GLUCOSE 110* 12/10/2014   CHOL  02/05/2009    136  ATP III CLASSIFICATION: <200 mg/dL Desirable 200-239 mg/dL Borderline High >=240 mg/dL High     TRIG 187* 02/05/2009   HDL 30* 02/05/2009   LDLCALC  02/05/2009    69  Total Cholesterol/HDL:CHD Risk Coronary Heart Disease Risk Table  Men Women 1/2 Average Risk 3.4 3.3 Average Risk 5.0 4.4 2 X Average Risk 9.6  7.1 3 X Average Risk 23.4 11.0   Use the calculated Patient Ratio above and the CHD Risk Table to determine the patient's CHD Risk.   ATP III CLASSIFICATION (LDL): <100 mg/dL Optimal 100-129 mg/dL Near or Above  Optimal 130-159 mg/dL Borderline 160-189 mg/dL High >190 mg/dL Very High   ALT 25 12/10/2014   AST 20 12/10/2014   NA 136 12/10/2014   K 5.2* 12/10/2014   CL 102 12/10/2014   CREATININE 2.02* 12/10/2014   BUN 35* 12/10/2014   CO2 26 12/10/2014   TSH 1.756 12/01/2014   INR 1.68* 11/30/2014      BNP (last 3 results)  Recent Labs (within last 365 days)     Recent Labs  11/09/14 0740 11/30/14 1325  BNP 534.3* 857.9*      ProBNP (last 3 results)  Recent Labs (within last 365 days)     Recent Labs  12/10/14 1214  PROBNP 1807.0*       Other Studies Reviewed Today:  Echo Study Conclusions from 11/2014  - Left  ventricle: The cavity size was moderately dilated. Wall thickness was increased in a pattern of moderate LVH. Systolic function was severely reduced. The estimated ejection fraction was in the range of 20% to 25%. Dyskinesis of the apicalapical myocardium. - Aortic valve: There was trivial regurgitation. - Mitral valve: There was moderate to severe regurgitation. - Left atrium: The atrium was moderately to severely dilated. - Right ventricle: The cavity size was mildly dilated. - Right atrium: The atrium was severely dilated. - Tricuspid valve: There was moderate regurgitation. - Pulmonary arteries: Systolic pressure was mildly increased. PA peak pressure: 36 mm Hg (S). - Pericardium, extracardiac: A trivial pericardial effusion was identified  Assessment/Plan: 1. Acute/chonic systolic HF with likely cardiogenic shock 2. NICM EF 20-25% 3. Chronic AF on Eliquis 4. Severe MR 5. Acute on chronic renal failure, stage  III-IV (creatinine 1.4 -> 2.0)  He has longstanding severe cardiomyopathy. Now with low-output symptoms. Will admit to ICU. [facility]. Will likely need inotropes. Long discussion about possibility of advanced therapies. Will d/w with VAD team.      Hold BB in decomp/ low output.  Will address diuresis after RHC.  Legrand Como [address] Archer, PA-C 12/14/2014 11:42 AM   Advanced Heart Failure Team Pager [phone] (M-F; 7a - 4p)  Please contact [facility] for night-coverage after hours (4p -7a ) and weekends on [email or web address]   Patient seen and examined with Oda Kilts, PA-C. We discussed all aspects of the encounter. I agree with the assessment and plan as stated above.   Will admit for low output HF. Will need swan and probable inotropic support. VAD team notified about potential need for advanced therapies in near future   Grettel Rames,MD 4:23 PM

## 2014-12-14 NOTE — Progress Notes (Signed)
Patient ID: [name], male   DOB: 1970/05/08, 44 y.o.   MRN: [id number]     CARDIOLOGY OFFICE NOTE  Date:  12/14/2014    Inez Catalina Date of Birth: October 14, 1970 Medical Record [id number]  PCP:  Sandi Mariscal, MD  Cardiologist:  Caryl Comes    History of Present Illness: [name] is a 44 y.o. 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  Presented in 1999 with SCD. Cath with normal cors and low EF. We saw him in 2008 and was admitted with low-output HF. Has not followed up with cardiology regularly except for Dr. Caryl Comes in [place].   Last week admitted with syncopal episode and hypotension and AKI in setting of diarrheal illness. Echo EF 20-25% moderate to severe MR and moderate TR.  CTA was ordered given concern for aortic pathology - this was negative, troponins were trended and and 1st one negative and his lactic acid was mildly elevated. EKG showed afib.Subsequently had TEE with Dr. Marlou Porch and had severe central MR due to central jet.   Saw [name] on Monday felt nauseated and weak. Referred to HF Clinic. 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 Dyspneic with any exertion. + orthopneic. + bloated and ab pain. No further diarrhea.  Lives alone. 2 sisters from [facility] are here with him .   Past Medical History  Diagnosis Date  . Chronic systolic heart failure     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%)  . Atrial fibrillation -parosysmal      Rx w amiodarone  . Noncompliance     H/O  MEDICAL NONCOMPLIANCE  . 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 Tricuspid valve regurgitation     SEVERE  . Severe mitral regurgitation   . Polymorphic ventricular tachycardia     RECURRENT WITH APPROPRIATE SHOCK THERAPY IN THE PAST  . Ventricular fibrillation     WITH APPROPRIATE SHOCK THERAPY IN THE PAST  . Hypertension   .  Gout   . RA (rheumatoid arthritis)   . Automatic implantable cardiac defibrillator -BSX     single chamber  . GI bleed -massive     11 Units 2012  . Elevated LFTs   . H/O hyperthyroidism   . CHF (congestive heart failure)   . AKI (acute kidney injury)     Past Surgical History  Procedure Laterality Date  . Cardiac catheterization  06/2006    RIGHT HEART CATH SHOWING SEVERE BIVENTRICUALR CHF WITH MARKED FILLING AND PRESSURES  . Insert / replace / remove pacemaker      GUIDANT HE ICD MODEL 2180, SERIAL # [id number]  . Cholecystectomy    . Implantable cardioverter defibrillator generator change N/A 07/01/2011    Procedure: IMPLANTABLE CARDIOVERTER DEFIBRILLATOR GENERATOR CHANGE;  Surgeon: Deboraha Sprang, MD;  Location: [facility] CATH LAB;  Service: Cardiovascular;  Laterality: N/A;  . Tee without cardioversion N/A 12/12/2014    Procedure: TRANSESOPHAGEAL ECHOCARDIOGRAM (TEE);  Surgeon: Jerline Pain, MD;  Location: [facility] ENDOSCOPY;  Service: Cardiovascular;  Laterality: N/A;     Medications: Current Outpatient Prescriptions  Medication Sig Dispense Refill  . allopurinol (ZYLOPRIM) 100 MG tablet Take 100 mg by mouth daily.  0  . apixaban (ELIQUIS) 5 MG TABS tablet Take 1 tablet (5 mg total) by mouth 2 (two) times daily. 60 tablet 3  .  carvedilol (COREG) 25 MG tablet Take 1 tablet (25 mg total) by mouth 2 (two) times daily with a meal. 60 tablet 1  . furosemide (LASIX) 40 MG tablet Take 1 tablet (40 mg total) by mouth 2 (two) times daily. 60 tablet 9  . spironolactone (ALDACTONE) 25 MG tablet Take 1 tablet (25 mg total) by mouth daily. 30 tablet 5   No current facility-administered medications for this encounter.    Allergies: No Known Allergies  Social History: The patient  reports that he has never smoked. He has never used smokeless tobacco. He reports that he does not drink alcohol or use illicit drugs.   Family History: The patient's family history includes Heart failure in his brother.     Review of Systems: Please see the history of present illness.   Otherwise, the review of systems is positive for none.   All other systems are reviewed and negative.   Physical Exam: VS:  BP 96/52 mmHg  Pulse 196  Ht 5\' 7"  (1.702 m)  Wt 176 lb 12.8 oz (80.196 kg)  BMI 27.68 kg/m2  SpO2 97% .  BMI Body mass index is 27.68 kg/(m^2).  Wt Readings from Last 3 Encounters:  12/14/14 176 lb 12.8 oz (80.196 kg)  12/12/14 179 lb (81.194 kg)  12/10/14 179 lb 9.6 oz (81.466 kg)    General: Weak appearing NAD HEENT: Normal. Neck: Supple, JVP to jaw no carotid bruits or masses noted.  Cardiac: Irregular irregular rate and rhythm. III/VI systolic murmur at the apex noted. + s3. 1+ edema Respiratory:  Lungs are clear to auscultation bilaterally with normal work of breathing.  GI: Soft and nontender. Belly distended MS: No deformity or atrophy. Gait and ROM intact. Skin: Warm and dry. Color is normal.  Neuro:  Strength and sensation are intact and no gross focal deficits noted.  Psych: Alert, appropriate and with normal affect.   LABORATORY DATA:  EKG:  EKG is ordered today. This demonstrates AF with RVR.  Lab Results  Component Value Date   WBC 7.8 12/10/2014   HGB 13.7 12/10/2014   HCT 46.1 12/10/2014   PLT 287.0 12/10/2014   GLUCOSE 110* 12/10/2014   CHOL  02/05/2009    136        ATP III CLASSIFICATION:  <200     mg/dL   Desirable  200-239  mg/dL   Borderline High  >=240    mg/dL   High          TRIG 187* 02/05/2009   HDL 30* 02/05/2009   LDLCALC  02/05/2009    69        Total Cholesterol/HDL:CHD Risk Coronary Heart Disease Risk Table                     Men   Women  1/2 Average Risk   3.4   3.3  Average Risk       5.0   4.4  2 X Average Risk   9.6   7.1  3 X Average Risk  23.4   11.0        Use the calculated Patient Ratio above and the CHD Risk Table to determine the patient's CHD Risk.        ATP III CLASSIFICATION (LDL):  <100     mg/dL   Optimal  100-129   mg/dL   Near or Above  Optimal  130-159  mg/dL   Borderline  160-189  mg/dL   High  >190     mg/dL   Very High   ALT 25 12/10/2014   AST 20 12/10/2014   NA 136 12/10/2014   K 5.2* 12/10/2014   CL 102 12/10/2014   CREATININE 2.02* 12/10/2014   BUN 35* 12/10/2014   CO2 26 12/10/2014   TSH 1.756 12/01/2014   INR 1.68* 11/30/2014    BNP (last 3 results)  Recent Labs  11/09/14 0740 11/30/14 1325  BNP 534.3* 857.9*    ProBNP (last 3 results)  Recent Labs  12/10/14 1214  PROBNP 1807.0*     Other Studies Reviewed Today:  Echo Study Conclusions from 11/2014  - Left ventricle: The cavity size was moderately dilated. Wall thickness was increased in a pattern of moderate LVH. Systolic function was severely reduced. The estimated ejection fraction was in the range of 20% to 25%. Dyskinesis of the apicalapical myocardium. - Aortic valve: There was trivial regurgitation. - Mitral valve: There was moderate to severe regurgitation. - Left atrium: The atrium was moderately to severely dilated. - Right ventricle: The cavity size was mildly dilated. - Right atrium: The atrium was severely dilated. - Tricuspid valve: There was moderate regurgitation. - Pulmonary arteries: Systolic pressure was mildly increased. PA peak pressure: 36 mm Hg (S). - Pericardium, extracardiac: A trivial pericardial effusion was identified  Assessment/Plan: 1. Acute/chonic systolic HF with likely cardiogenic shock 2. NICM EF 20-25% 3. Chronic AF on Eliquis 4. Severe MR 5. Acute on chronic renal failure, stage III-IV (creatinine 1.4 -> 2.0)  He has longstanding severe cardiomyopathy. Now with low-output symptoms. Will admit to ICU. Place [facility]. Will likely need inotropes. Long discussion about possibility of advanced therapies. Will d/w with VAD team.   Richard Ritchey,MD 11:30 AM

## 2014-12-14 NOTE — Progress Notes (Addendum)
ANTICOAGULATION CONSULT NOTE - Initial Consult  Pharmacy Consult for Heparin  Indication:  IABP  No Known Allergies  Patient Measurements: Height: 5\' 7"  (170.2 cm) Weight: 173 lb 4.5 oz (78.6 kg) IBW/kg (Calculated) : 66.1 Heparin Dosing Weight: 78.6 kg  Vital Signs: Temp: 97.6 F (36.4 C) (08/26 1400) Temp Source: Oral (08/26 1400) BP: 105/83 mmHg (08/26 1747) Pulse Rate: 45 (08/26 1500)  Labs:  Recent Labs  12/14/14 1203 12/14/14 1359  HGB 13.8 13.8  HCT 43.3 42.0  PLT 232 235  LABPROT 24.5*  --   INR 2.23*  --   CREATININE 2.33* 2.41*    Estimated Creatinine Clearance: 35.8 mL/min (by C-G formula based on Cr of 2.41).   Medical History: Past Medical History  Diagnosis Date  . Chronic systolic heart failure     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%)  . Atrial fibrillation -parosysmal      Rx w amiodarone  . Noncompliance     H/O  MEDICAL NONCOMPLIANCE  . 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 Tricuspid valve regurgitation     SEVERE  . Severe mitral regurgitation   . Polymorphic ventricular tachycardia     RECURRENT WITH APPROPRIATE SHOCK THERAPY IN THE PAST  . Ventricular fibrillation     WITH APPROPRIATE SHOCK THERAPY IN THE PAST  . Hypertension   . Gout   . RA (rheumatoid arthritis)   . Automatic implantable cardiac defibrillator -BSX     single chamber  . GI bleed -massive     11 Units 2012  . Elevated LFTs   . H/O hyperthyroidism   . CHF (congestive heart failure)   . AKI (acute kidney injury)     Medications:  Prescriptions prior to admission  Medication Sig Dispense Refill Last Dose  . allopurinol (ZYLOPRIM) 100 MG tablet Take 100 mg by mouth daily.  0 12/14/2014 at Unknown time  . apixaban (ELIQUIS) 5 MG TABS tablet Take 1 tablet (5 mg total) by mouth 2 (two) times daily. 60 tablet 3 12/14/2014 at Unknown time  . carvedilol (COREG) 25 MG tablet Take 1 tablet (25 mg total) by mouth 2  (two) times daily with a meal. 60 tablet 1 12/14/2014 at 0930  . furosemide (LASIX) 40 MG tablet Take 1 tablet (40 mg total) by mouth 2 (two) times daily. 60 tablet 9 12/14/2014 at Unknown time  . spironolactone (ALDACTONE) 25 MG tablet Take 1 tablet (25 mg total) by mouth daily. 30 tablet 5 12/14/2014 at Unknown time   Scheduled:  . [START ON 12/15/2014] furosemide  80 mg Intravenous BID  . [START ON 12/15/2014] Influenza vac split quadrivalent PF  0.5 mL Intramuscular Tomorrow-1000  . [START ON 12/15/2014] pneumococcal 23 valent vaccine  0.5 mL Intramuscular Tomorrow-1000  . sodium chloride  3 mL Intravenous Q12H  . sodium chloride  3 mL Intravenous Q12H  . sodium chloride  3 mL Intravenous Q12H  . sodium chloride  3 mL Intravenous Q12H    Assessment: 44 y.o male who has longstanding severe cardiomyopathy, Chronic AF on Eliquis and prior DVT history. He is now  s/p cardiac cath today 12/14/14 with IABP.   Last dose of Eliquis taken this AM 12/14/14.  He received heparin 2000units x 1 in cath lab for IABP. Pharmacy consult to start IV heparin infusion for IABP.   Protime 24.5, INR 2.23, H/H 13.8/42.0 and PLTC 235 No bleeding reported.  Ordered STAT baseline PTT and  heparin level.  We will monitor PTT & Heparin level, adjusting heparin drip rate based on PTT due to patient taking apixaban PTA, until heparin level correlates with aPTT.   Goal of Therapy:  PTT = 60-85 seconds Heparin level 0.2-0.5 units/ml Monitor platelets by anticoagulation protocol: Yes   Plan:  RN to draw STAT baseline PTT and heparin level before starting heparin drip.  No heparin bolus. Start IV heparin infusion at 800 units/hr (~10 units/kg/hr) Monitor PTT & Heparin level in 6 hours then daily aPTT, heparin level and CBC  Thank you for allowing pharmacy to be part of this patients care team. Nicole Cella, RPh Clinical Pharmacist Pager: [phone] 12/14/2014,6:18 PM

## 2014-12-14 NOTE — Interval H&P Note (Signed)
History and Physical Interval Note:  12/14/2014 4:52 PM  [name]  has presented today for surgery, with the diagnosis of shock  The various methods of treatment have been discussed with the patient and family. After consideration of risks, benefits and other options for treatment, the patient has consented to  Procedure(s): Right Heart Cath (N/A) as a surgical intervention .  The patient's history has been reviewed, patient examined, no change in status, stable for surgery.  I have reviewed the patient's chart and labs.  Questions were answered to the patient's satisfaction.     [name], Quillian Quince

## 2014-12-14 NOTE — Progress Notes (Signed)
Orthopedic Tech Progress Note Patient Details:  [name] 04/23/1970 [id number]  Ortho Devices Type of Ortho Device: Knee Immobilizer Ortho Device/Splint Location: RLE Ortho Device/Splint Interventions: Ordered, Application   Braulio Bosch 12/14/2014, 8:51 PM

## 2014-12-14 NOTE — Addendum Note (Signed)
Encounter addended by: Effie Berkshire, RN on: 12/14/2014 11:41 AM<BR>     Documentation filed: Visit Diagnoses, Dx Association, Orders

## 2014-12-14 NOTE — Progress Notes (Signed)
Advanced Heart Failure Medication Review by a Pharmacist  Does the patient  feel that his/her medications are working for him/her?  yes  Has the patient been experiencing any side effects to the medications prescribed?  no  Does the patient measure his/her own blood pressure or blood glucose at home?  no   Does the patient have any problems obtaining medications due to transportation or finances?   no  Understanding of regimen: good Understanding of indications: good Potential of compliance: good    Pharmacist comments: [name] is a pleasant 44 yo M presenting with an updated medication list and some family members. He seems to have a good understanding of his medication regimen and the importance of consistent use. He does admit to forgetting to take his medications sometimes once a week. He did not have any specific medication-related questions or concerns at this time.   Ruta Hinds. Velva Harman, PharmD, BCPS, CPP Clinical Pharmacist Pager: [phone] Phone: [phone] 12/14/2014 10:47 AM

## 2014-12-15 ENCOUNTER — Inpatient Hospital Stay (HOSPITAL_COMMUNITY): Payer: 59

## 2014-12-15 DIAGNOSIS — N179 Acute kidney failure, unspecified: Secondary | ICD-10-CM | POA: Insufficient documentation

## 2014-12-15 DIAGNOSIS — I482 Chronic atrial fibrillation, unspecified: Secondary | ICD-10-CM | POA: Insufficient documentation

## 2014-12-15 DIAGNOSIS — N189 Chronic kidney disease, unspecified: Secondary | ICD-10-CM

## 2014-12-15 DIAGNOSIS — I5023 Acute on chronic systolic (congestive) heart failure: Secondary | ICD-10-CM | POA: Insufficient documentation

## 2014-12-15 DIAGNOSIS — R570 Cardiogenic shock: Secondary | ICD-10-CM

## 2014-12-15 LAB — URINALYSIS, ROUTINE W REFLEX MICROSCOPIC
Bilirubin Urine: NEGATIVE
Glucose, UA: NEGATIVE mg/dL
Hgb urine dipstick: NEGATIVE
Ketones, ur: NEGATIVE mg/dL
Leukocytes, UA: NEGATIVE
Nitrite: NEGATIVE
Protein, ur: NEGATIVE mg/dL
Specific Gravity, Urine: 1.008 (ref 1.005–1.030)
Urobilinogen, UA: 1 mg/dL (ref 0.0–1.0)
pH: 7 (ref 5.0–8.0)

## 2014-12-15 LAB — MAGNESIUM: Magnesium: 2 mg/dL (ref 1.7–2.4)

## 2014-12-15 LAB — CBC
HCT: 39.3 % (ref 39.0–52.0)
Hemoglobin: 12.8 g/dL — ABNORMAL LOW (ref 13.0–17.0)
MCH: 21.6 pg — ABNORMAL LOW (ref 26.0–34.0)
MCHC: 32.6 g/dL (ref 30.0–36.0)
MCV: 66.3 fL — ABNORMAL LOW (ref 78.0–100.0)
PLATELETS: 271 10*3/uL (ref 150–400)
RBC: 5.93 MIL/uL — ABNORMAL HIGH (ref 4.22–5.81)
RDW: 16.4 % — AB (ref 11.5–15.5)
WBC: 6.4 10*3/uL (ref 4.0–10.5)

## 2014-12-15 LAB — APTT
APTT: 43 s — AB (ref 24–37)
aPTT: 66 seconds — ABNORMAL HIGH (ref 24–37)
aPTT: 62 seconds — ABNORMAL HIGH (ref 24–37)

## 2014-12-15 LAB — HEPARIN LEVEL (UNFRACTIONATED): Heparin Unfractionated: >2.2 IU/mL — ABNORMAL HIGH (ref 0.30–0.70)

## 2014-12-15 LAB — BASIC METABOLIC PANEL
Anion gap: 8 (ref 5–15)
BUN: 43 mg/dL — AB (ref 6–20)
CO2: 27 mmol/L (ref 22–32)
Calcium: 9 mg/dL (ref 8.9–10.3)
Chloride: 99 mmol/L — ABNORMAL LOW (ref 101–111)
Creatinine, Ser: 2.36 mg/dL — ABNORMAL HIGH (ref 0.61–1.24)
GFR calc Af Amer: 36 mL/min — ABNORMAL LOW (ref >60)
GFR, EST NON AFRICAN AMERICAN: 31 mL/min — AB (ref >60)
GLUCOSE: 146 mg/dL — AB (ref 65–99)
POTASSIUM: 4 mmol/L (ref 3.5–5.1)
Sodium: 134 mmol/L — ABNORMAL LOW (ref 135–145)

## 2014-12-15 LAB — CARBOXYHEMOGLOBIN
Carboxyhemoglobin: 1.1 % (ref 0.5–1.5)
METHEMOGLOBIN: 0.9 % (ref 0.0–1.5)
O2 SAT: 62.1 %
TOTAL HEMOGLOBIN: 13.1 g/dL — AB (ref 13.5–18.0)

## 2014-12-15 LAB — ANTITHROMBIN III: AntiThromb III Func: 71 % — ABNORMAL LOW (ref 75–120)

## 2014-12-15 LAB — SURGICAL PCR SCREEN
MRSA, PCR: NEGATIVE
Staphylococcus aureus: POSITIVE — AB

## 2014-12-15 MED ORDER — POTASSIUM CHLORIDE ER 10 MEQ PO TBCR
20.0000 meq | EXTENDED_RELEASE_TABLET | Freq: Two times a day (BID) | ORAL | Status: DC
  Administered 2014-12-15 – 2014-12-19 (×9): 20 meq via ORAL
  Filled 2014-12-15 (×16): qty 2

## 2014-12-15 MED ORDER — DIPHENHYDRAMINE HCL 25 MG PO CAPS
25.0000 mg | ORAL_CAPSULE | Freq: Once | ORAL | Status: AC
  Administered 2014-12-15: 25 mg via ORAL
  Filled 2014-12-15: qty 1

## 2014-12-15 MED ORDER — CHLORHEXIDINE GLUCONATE CLOTH 2 % EX PADS
6.0000 | MEDICATED_PAD | Freq: Every day | CUTANEOUS | Status: DC
  Administered 2014-12-16 – 2014-12-19 (×4): 6 via TOPICAL

## 2014-12-15 MED ORDER — MIDAZOLAM BOLUS VIA INFUSION
1.0000 mg | INTRAVENOUS | Status: DC | PRN
  Filled 2014-12-15: qty 2

## 2014-12-15 MED ORDER — MUPIROCIN 2 % EX OINT
1.0000 "application " | TOPICAL_OINTMENT | Freq: Two times a day (BID) | CUTANEOUS | Status: DC
  Administered 2014-12-15 – 2014-12-19 (×9): 1 via NASAL
  Filled 2014-12-15: qty 22

## 2014-12-15 MED ORDER — FENTANYL BOLUS VIA INFUSION
25.0000 ug | INTRAVENOUS | Status: DC | PRN
  Filled 2014-12-15: qty 50

## 2014-12-15 MED ORDER — MUPIROCIN 2 % EX OINT
TOPICAL_OINTMENT | CUTANEOUS | Status: AC
  Filled 2014-12-15: qty 22

## 2014-12-15 NOTE — Progress Notes (Signed)
ANTICOAGULATION CONSULT NOTE - Follow Up Consult  Pharmacy Consult for heparin Indication: IABP  No Known Allergies  Patient Measurements: Height: 5\' 7"  (170.2 cm) Weight: 175 lb 0.7 oz (79.4 kg) (weighed with knee imobilizer. Not sure of weight. Not on box) IBW/kg (Calculated) : 66.1 Heparin Dosing Weight: 78.6 kg  Vital Signs: Temp: 98.4 F (36.9 C) (08/27 2100) Temp Source: Core (Comment) (08/27 2000) BP: 140/85 mmHg (08/27 2100) Pulse Rate: 108 (08/27 2100)  Labs:  Recent Labs  12/14/14 1203 12/14/14 1359  12/14/14 2025 12/15/14 0300 12/15/14 1221 12/15/14 2045  HGB 13.8 13.8  --   --  12.8*  --   --   HCT 43.3 42.0  --   --  39.3  --   --   PLT 232 235  --   --  271  --   --   APTT  --   --   < > 39* 43* 66* 62*  LABPROT 24.5*  --   --   --   --   --   --   INR 2.23*  --   --   --   --   --   --   HEPARINUNFRC  --   --   --  1.97* >2.20*  --   --   CREATININE 2.33* 2.41*  --   --  2.36*  --   --   < > = values in this interval not displayed.  Estimated Creatinine Clearance: 39.5 mL/min (by C-G formula based on Cr of 2.36).   Assessment: 44 yo m with longstanding severe cardiomyopathy. Also has a history of chronic afib and DVT on Eliquis PTA. Last dose of Eliquis taken 8/26.  Pharmacy is consulted to dose heparin.  Pharmacy has been using aPTT to monitor heparin and guide dosing as Eliquis is affecting heparin levels. -aPTT= 62 and at goal  Goal of Therapy:  Heparin level 0.2-.0.5 units/ml aPTT 60-85 seconds Monitor platelets by anticoagulation protocol: Yes   Plan:  Continue heparin infusion at 950 units/hr Heparin level and aPTT in am  Hildred Laser, Pharm D 12/15/2014 10:09 PM

## 2014-12-15 NOTE — Progress Notes (Signed)
Damp PA waveform SG pulled back 8 cm and flushed distal port, locked at 42 cm Good waveform now IABP looks ok in portable CXR [place]

## 2014-12-15 NOTE — Progress Notes (Signed)
1 Day Post-Op Procedure(s) (LRB): Right Heart Cath (N/A) IABP Insertion (N/A) Subjective: Patient examined and echocardiogram performed recently this month personally reviewed  Long-standing nonischemic cardiomyopathy with recent acute decompensation and low cardiac output-cardiogenic shock requiring Intra-aortic balloon pump and inotropes. Now with stabilizing hemodynamics.  Creatinine earlier this year 1.7, now up to 2.3-hopefully will improve now hemodynamics have been improved. Agree with plan for stopping Eliqus to allow for washout prior to potential implantable LVAD   Objective: Vital signs in last 24 hours: Temp:  [97.5 F (36.4 C)-98.6 F (37 C)] 97.9 F (36.6 C) (08/27 0900) Pulse Rate:  [0-160] 76 (08/27 0900) Cardiac Rhythm:  [-] Atrial fibrillation (08/27 0800) Resp:  [4-52] 20 (08/27 0900) BP: (87-148)/(53-98) 129/65 mmHg (08/27 0900) SpO2:  [0 %-100 %] 96 % (08/27 0900) Weight:  [173 lb 4.5 oz (78.6 kg)-175 lb 0.7 oz (79.4 kg)] 175 lb 0.7 oz (79.4 kg) (08/27 0337)  Hemodynamic parameters for last 24 hours: PAP: (39-67)/(23-47) 39/23 mmHg CVP:  [5 mmHg-13 mmHg] 8 mmHg PCWP:  [24 mmHg-26 mmHg] 25 mmHg CO:  [3 L/min-4 L/min] 3.9 L/min CI:  [1.6 L/min/m2-2.1 L/min/m2] 2.1 L/min/m2  Intake/Output from previous day: 08/26 0701 - 08/27 0700 In: 1725.2 [P.O.:1200; I.V.:525.2] Out: 2500 [Urine:2500] Intake/Output this shift: Total I/O In: 37.7 [I.V.:37.7] Out: 48 [Urine:250]  Middle-aged AA male breathing comfortably supine with balloon pump in place   breath sounds clear Normal mentation Adequate urine output  Lab Results:  Recent Labs  12/14/14 1359 12/15/14 0300  WBC 6.1 6.4  HGB 13.8 12.8*  HCT 42.0 39.3  PLT 235 271   BMET:  Recent Labs  12/14/14 1359 12/15/14 0300  NA 136 134*  K 5.2* 4.0  CL 101 99*  CO2 25 27  GLUCOSE 104* 146*  BUN 43* 43*  CREATININE 2.41* 2.36*  CALCIUM 9.1 9.0    PT/INR:  Recent Labs  12/14/14 1203  LABPROT  24.5*  INR 2.23*   ABG    Component Value Date/Time   HCO3 22.2 12/14/2014 1803   TCO2 23 12/14/2014 1803   ACIDBASEDEF 2.0 12/14/2014 1803   O2SAT 62.1 12/15/2014 0505   CBG (last 3)  No results for input(s): GLUCAP in the last 72 hours.  Assessment/Plan: S/P Procedure(s) (LRB): Right Heart Cath (N/A) IABP Insertion (N/A) Diuresis with hemodynamic support and PA catheter monitoring initiate multidisciplinary evaluation for implantable LVAD  We'll follow   LOS: 1 day    [name] 12/15/2014

## 2014-12-15 NOTE — Progress Notes (Signed)
ANTICOAGULATION CONSULT NOTE - Follow-up Consult  Pharmacy Consult for Heparin  Indication:  IABP  No Known Allergies  Patient Measurements: Height: 5\' 7"  (170.2 cm) Weight: 175 lb 0.7 oz (79.4 kg) (weighed with knee imobilizer. Not sure of weight. Not on box) IBW/kg (Calculated) : 66.1 Heparin Dosing Weight: 78.6 kg  Vital Signs: Temp: 97.5 F (36.4 C) (08/27 0430) Temp Source: Core (Comment) (08/27 0400) BP: 109/69 mmHg (08/27 0430) Pulse Rate: 99 (08/27 0430)  Labs:  Recent Labs  12/14/14 1203 12/14/14 1359 12/14/14 2025 12/15/14 0300  HGB 13.8 13.8  --  12.8*  HCT 43.3 42.0  --  39.3  PLT 232 235  --  PENDING  APTT  --   --  39* 43*  LABPROT 24.5*  --   --   --   INR 2.23*  --   --   --   HEPARINUNFRC  --   --  1.97*  --   CREATININE 2.33* 2.41*  --  2.36*    Estimated Creatinine Clearance: 39.5 mL/min (by C-G formula based on Cr of 2.36).   Assessment: 44 y.o male who has longstanding severe cardiomyopathy, Chronic AF on Eliquis and prior DVT history. s/p cardiac cath 8/26 with IABP.   Last dose of Eliquis taken 8/26. Pt continues on IV heparin. Using aPTT to monitor heparin as Eliquis still affecting heparin level (baseline 1.97). Baseline INR 2.23 (likely also being affected by Eliquis). No issues with line or bleeding per RN.  Goal of Therapy:  PTT = 60-85 seconds Heparin level 0.2-0.5 units/ml Monitor platelets by anticoagulation protocol: Yes   Plan:  Increase heparin infusion at 950 units/hr  F/u 6 hr aPTT level  Thank you for allowing pharmacy to be part of this patients care team. Sherlon Handing, PharmD, BCPS Clinical pharmacist, pager [phone] 12/15/2014,4:57 AM

## 2014-12-15 NOTE — Progress Notes (Signed)
[facility] EDUCATION NOTE:   VAD evaluation consent reviewed and signed by [name].  Initial VAD teaching completed with patient at the bedside.   VAD educational packet including "HM II Patient Handbook", "HM II Left Ventricular Assist System" packet, and "[place] HM II Patient Education" reviewed in detail with me and left at bedside for continued reference.  All questions answered regarding VAD implant, hospital stay, and what to expect when discharged home living with a heart pump. Pt did not identify a caregiver at this time due to how quickly his illness has deteriorated, however is considering a brother per a conversation he had with his nurse today.  Explained need for 24/7 care when pt is  discharged home due to sternal precautions, adaptation to living on support, emotional support, consistent and meticulous exit site care and management, medication adherence and high volume of follow up visits with the [facility] after discharge;  he verbalized understanding of above.   Explained that LVAD can be implanted for two indications in the setting of advanced left ventricular heart failure treatment:  1. Bridge to transplant - used for patients who cannot safely wait for heart transplant without this device.  Or   2. Destination therapy - used for patients until end of life or recovery of heart function.  Patient acknowledges that the indication at this point in time for LVAD therapy would be for Destination Therapy due to his kidney function and having not been evaluated properly for transplant. Discussed that these indications for treatment are not set in stone and often change in accordance to patient's clinical situation and personal wishes.   Provided brief equipment overview and demonstration with HeartMate II training loop including discussion on the following:  a) system controller b) pump components c) driveline   d) battery clips  e) batteries   Reviewed  and supplied a copy of home inspection check list stressing that only three pronged grounded power outlets can be used for VAD equipment. [name] confirmed that his home has electrical outlets that will support the equipment.   Identified the following lifestyle modifications while living on MCS:   1. No driving for at least three months and then only if doctor gives permission to do so.  2. No tub baths while pump implanted, and shower only when doctor gives permission.  3. No swimming or submersion in water while implanted with pump.  4. No contact sports or engaging in jumping activities.  5. Always have a backup controller, charged spare batteries, and battery clips nearby at all times in case of emergency.  6. Call the doctor or hospital contact person if any change in how the pump sounds, feels, or works.  7. Plan to sleep only when connected to the power module.  8. Do not sleep on your stomach.  9. Keep a backup system controller, charged batteries, battery clips, and flashlight near you during sleep in case of electrical power outage. 10. Exit site care including dressing changes, monitoring for infection, and importance of keeping percutaneous lead stabilized at all times.   Reviewed pictures of VAD drive line, site care, dressing changes, and drive line stabilization including securement attachment device and abdominal binder. Discussed with pt and family that they will be required to purchase dressing supplies as long as patient has the VAD in place.   All questions have been answered at this time and contact information was provided should they encounter any further questions.  He is agreeable at this time  to the evaluation process and will move forward.   Total Session Time: 60 minutes  [name] Madeira, RN VAD Coordinator   Office: [phone] 24/7 VAD Pager: [phone]

## 2014-12-15 NOTE — Progress Notes (Signed)
Advanced Heart Failure Rounding Note   Subjective:    [name] is a 44 y.o. 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  Admitted 8/26 with cardiogenic shock. PCWP 35 and co-ox 20%  IABP placed 8/26. On milrinone and levophed. Feeling much better. Cramping in feet but distal pulses palpable.   Swan numbers done personally CVP 7 PA 40/26 (30) PCWP 25 with prominent v-waves Thermo 3.9/2.0  Co-ox 62.1%  Cr 2.4 -> 2.3   Objective:   Weight Range:  Vital Signs:   Temp:  [97.5 F (36.4 C)-98.6 F (37 C)] 97.5 F (36.4 C) (08/27 0500) Pulse Rate:  [0-196] 88 (08/27 0500) Resp:  [4-52] 8 (08/27 0500) BP: (87-148)/(52-98) 122/67 mmHg (08/27 0500) SpO2:  [0 %-100 %] 94 % (08/27 0500) Weight:  [78.6 kg (173 lb 4.5 oz)-80.196 kg (176 lb 12.8 oz)] 79.4 kg (175 lb 0.7 oz) (08/27 0337)    Weight change: Filed Weights   12/14/14 1400 12/15/14 0337  Weight: 78.6 kg (173 lb 4.5 oz) 79.4 kg (175 lb 0.7 oz)    Intake/Output:   Intake/Output Summary (Last 24 hours) at 12/15/14 [id number] Last data filed at 12/15/14 0500  Gross per 24 hour  Intake 1649.93 ml  Output   2125 ml  Net -475.07 ml     Physical Exam: General: Lying in bed  HEENT: Normal.  Neck: RIJ swan Supple, JVP to jaw no carotid bruits or masses noted.  Cardiac: Irregular irregular rate and rhythm. III/VI systolic murmur at the apex noted. + s3. Respiratory: Lungs are clear to auscultation bilaterally with normal work of breathing.  GI: Soft and nontender. Belly distended Extremities: RFem IABP. Warm trace edema. DPs + bilaterally  Skin: Warm and dry. Color is normal.  Neuro: Strength and sensation are intact and no gross focal deficits noted.  Psych: Alert, appropriate and with normal affect.  Telemetry: AF  Labs: Basic Metabolic Panel:  Recent Labs Lab 12/10/14 1214 12/14/14 1203 12/14/14 1359  12/15/14 0300  NA 136 136 136 134*  K 5.2* 5.0 5.2* 4.0  CL 102 103 101 99*  CO2 26 22 25 27   GLUCOSE 110* 124* 104* 146*  BUN 35* 41* 43* 43*  CREATININE 2.02* 2.33* 2.41* 2.36*  CALCIUM 9.5 9.3 9.1 9.0    Liver Function Tests:  Recent Labs Lab 12/10/14 1214 12/14/14 1203 12/14/14 1359  AST 20 25 26   ALT 25 36 36  ALKPHOS 82 89 88  BILITOT 1.1 0.9 1.1  PROT 7.5 8.3* 7.7  ALBUMIN 3.8 3.8 3.5    Recent Labs Lab 12/10/14 1214  LIPASE 16.0  AMYLASE 41   No results for input(s): AMMONIA in the last 168 hours.  CBC:  Recent Labs Lab 12/10/14 1214 12/14/14 1203 12/14/14 1359 12/15/14 0300  WBC 7.8 6.4 6.1 6.4  NEUTROABS  --   --  2.9  --   HGB 13.7 13.8 13.8 12.8*  HCT 46.1 43.3 42.0 39.3  MCV 71.5* 68.5* 67.4* 66.3*  PLT 287.0 232 235 PENDING    Cardiac Enzymes: No results for input(s): CKTOTAL, CKMB, CKMBINDEX, TROPONINI in the last 168 hours.  BNP: BNP (last 3 results)  Recent Labs  11/30/14 1325 12/14/14 1203 12/14/14 1359  BNP 857.9* 1341.9* 1473.6*    ProBNP (last 3 results)  Recent Labs  12/10/14 1214  PROBNP 1807.0*      Other results:  Imaging:  No results found.   Medications:     Scheduled Medications: . furosemide  80 mg Intravenous BID  . Influenza vac split quadrivalent PF  0.5 mL Intramuscular Tomorrow-1000  . pneumococcal 23 valent vaccine  0.5 mL Intramuscular Tomorrow-1000  . sodium chloride  3 mL Intravenous Q12H  . sodium chloride  3 mL Intravenous Q12H  . sodium chloride  3 mL Intravenous Q12H     Infusions: . heparin 950 Units/hr (12/15/14 0504)  . milrinone 0.375 mcg/kg/min (12/15/14 0203)  . norepinephrine (LEVOPHED) Adult infusion 10 mcg/min (12/15/14 0415)     PRN Medications:  sodium chloride, sodium chloride, sodium chloride, acetaminophen, ondansetron (ZOFRAN) IV, sodium chloride, sodium chloride, sodium chloride   Assessment:   1. Cardiogenic shock 2. Acute/chonic systolic HF      --  NICM EF 20-25% 3. Chronic AF on Eliquis 4. Severe MR 5. Acute on chronic renal failure, stage III-IV (creatinine 1.4 -> 2.0)  Plan/Discussion:    Cardiac output much improved on IABP and dual inotropes. Renal function improved slightly but still > 2.0. Still mildly volume overloaded.  Continue diuresis, IABP and pressors. Remains on heparin for IABP and AF.  Will need VAD if renal function, RV and social situation permit. Dr. Prescott Gum and VAD team aware.   The patient is critically ill with multiple organ systems failure and requires high complexity decision making for assessment and support, frequent evaluation and titration of therapies, application of advanced monitoring technologies and extensive interpretation of multiple databases.   Critical Care Time devoted to patient care services described in this note is 35 Minutes.   Length of Stay: 1   [name], Daniel MD 12/15/2014, 6:05 AM  Advanced Heart Failure Team Pager [phone] (M-F; [place])  Please contact [facility] for night-coverage after hours (4p -7a ) and weekends on [email or web address]

## 2014-12-15 NOTE — Progress Notes (Signed)
ANTICOAGULATION CONSULT NOTE - Follow Up Consult  Pharmacy Consult for heparin Indication: IABP  No Known Allergies  Patient Measurements: Height: 5\' 7"  (170.2 cm) Weight: 175 lb 0.7 oz (79.4 kg) (weighed with knee imobilizer. Not sure of weight. Not on box) IBW/kg (Calculated) : 66.1 Heparin Dosing Weight: 78.6 kg  Vital Signs: Temp: 98.1 F (36.7 C) (08/27 1200) Temp Source: Core (Comment) (08/27 1200) BP: 112/57 mmHg (08/27 1200) Pulse Rate: 101 (08/27 1200)  Labs:  Recent Labs  12/14/14 1203 12/14/14 1359 12/14/14 2025 12/15/14 0300 12/15/14 1221  HGB 13.8 13.8  --  12.8*  --   HCT 43.3 42.0  --  39.3  --   PLT 232 235  --  271  --   APTT  --   --  39* 43* 66*  LABPROT 24.5*  --   --   --   --   INR 2.23*  --   --   --   --   HEPARINUNFRC  --   --  1.97* >2.20*  --   CREATININE 2.33* 2.41*  --  2.36*  --     Estimated Creatinine Clearance: 39.5 mL/min (by C-G formula based on Cr of 2.36).   Assessment: 44 yo m with longstanding severe cardiomyopathy. Also has a history of chronic afib and DVT on Eliquis PTA. Last dose of Eliquis taken 8/26.  Pharmacy is consulted to dose heparin.  Pharmacy has been using aPTT to monitor heparin and guide dosing as Eliquis is affecting heparin levels. Last HL >2.20 this AM. aPTT today is therapeutic at 66 on 950 units/hr. Will continue to monitor aPTT levels until HL is within goal range or lower (0.2-0.5).  No issues with line or bleeding per RN and CBC stable.  Goal of Therapy:  Heparin level 0.2-.0.5 units/ml aPTT 60-85 seconds Monitor platelets by anticoagulation protocol: Yes   Plan:  Continue heparin infusion at 950 units/hr F/u confirmatory 6-hr aPTT @ 1900  Joelie Schou L. Nicole Kindred, PharmD Clinical Pharmacy Resident Pager: [phone] 12/15/2014 1:01 PM

## 2014-12-16 ENCOUNTER — Inpatient Hospital Stay (HOSPITAL_COMMUNITY): Payer: 59

## 2014-12-16 LAB — CARBOXYHEMOGLOBIN
CARBOXYHEMOGLOBIN: 1.6 % — AB (ref 0.5–1.5)
METHEMOGLOBIN: 1.3 % (ref 0.0–1.5)
O2 Saturation: 61.8 %
Total hemoglobin: 14.4 g/dL (ref 13.5–18.0)

## 2014-12-16 LAB — BASIC METABOLIC PANEL
ANION GAP: 7 (ref 5–15)
Anion gap: 9 (ref 5–15)
BUN: 31 mg/dL — ABNORMAL HIGH (ref 6–20)
BUN: 29 mg/dL — AB (ref 6–20)
CALCIUM: 8.9 mg/dL (ref 8.9–10.3)
CHLORIDE: 101 mmol/L (ref 101–111)
CHLORIDE: 100 mmol/L — AB (ref 101–111)
CO2: 27 mmol/L (ref 22–32)
CO2: 24 mmol/L (ref 22–32)
CREATININE: 1.8 mg/dL — AB (ref 0.61–1.24)
Calcium: 8.8 mg/dL — ABNORMAL LOW (ref 8.9–10.3)
Creatinine, Ser: 1.88 mg/dL — ABNORMAL HIGH (ref 0.61–1.24)
GFR calc Af Amer: 48 mL/min — ABNORMAL LOW (ref >60)
GFR calc non Af Amer: 43 mL/min — ABNORMAL LOW (ref >60)
GFR, EST AFRICAN AMERICAN: 50 mL/min — AB (ref >60)
GFR, EST NON AFRICAN AMERICAN: 41 mL/min — AB (ref >60)
GLUCOSE: 131 mg/dL — AB (ref 65–99)
Glucose, Bld: 113 mg/dL — ABNORMAL HIGH (ref 65–99)
POTASSIUM: 3.9 mmol/L (ref 3.5–5.1)
Potassium: 4.3 mmol/L (ref 3.5–5.1)
SODIUM: 133 mmol/L — AB (ref 135–145)
Sodium: 135 mmol/L (ref 135–145)

## 2014-12-16 LAB — PSA: PSA: 0.42 ng/mL (ref 0.00–4.00)

## 2014-12-16 LAB — CBC
HCT: 44.3 % (ref 39.0–52.0)
HEMOGLOBIN: 14.5 g/dL (ref 13.0–17.0)
MCH: 21.9 pg — ABNORMAL LOW (ref 26.0–34.0)
MCHC: 32.7 g/dL (ref 30.0–36.0)
MCV: 66.8 fL — ABNORMAL LOW (ref 78.0–100.0)
PLATELETS: 199 10*3/uL (ref 150–400)
RBC: 6.63 MIL/uL — AB (ref 4.22–5.81)
RDW: 17 % — AB (ref 11.5–15.5)
WBC: 7.2 10*3/uL (ref 4.0–10.5)

## 2014-12-16 LAB — HEPATITIS B SURFACE ANTIGEN: Hepatitis B Surface Ag: NEGATIVE

## 2014-12-16 LAB — MAGNESIUM: MAGNESIUM: 2.1 mg/dL (ref 1.7–2.4)

## 2014-12-16 LAB — HIV ANTIBODY (ROUTINE TESTING W REFLEX): HIV Screen 4th Generation wRfx: NONREACTIVE

## 2014-12-16 LAB — APTT
aPTT: 51 seconds — ABNORMAL HIGH (ref 24–37)
aPTT: 67 seconds — ABNORMAL HIGH (ref 24–37)
aPTT: 72 seconds — ABNORMAL HIGH (ref 24–37)

## 2014-12-16 LAB — LIPID PANEL
CHOL/HDL RATIO: 3.1 ratio
Cholesterol: 111 mg/dL (ref 0–200)
HDL: 36 mg/dL — AB (ref >40)
LDL CALC: 62 mg/dL (ref 0–99)
TRIGLYCERIDES: 65 mg/dL (ref <150)
VLDL: 13 mg/dL (ref 0–40)

## 2014-12-16 LAB — T4, FREE: FREE T4: 1.02 ng/dL (ref 0.61–1.12)

## 2014-12-16 LAB — HEPARIN LEVEL (UNFRACTIONATED): Heparin Unfractionated: 0.81 IU/mL — ABNORMAL HIGH (ref 0.30–0.70)

## 2014-12-16 LAB — LACTATE DEHYDROGENASE: LDH: 279 U/L — ABNORMAL HIGH (ref 98–192)

## 2014-12-16 LAB — PREALBUMIN: PREALBUMIN: 16.9 mg/dL — AB (ref 18–38)

## 2014-12-16 LAB — URIC ACID: Uric Acid, Serum: 7.4 mg/dL (ref 4.4–7.6)

## 2014-12-16 MED ORDER — AMIODARONE HCL IN DEXTROSE 360-4.14 MG/200ML-% IV SOLN
INTRAVENOUS | Status: AC
  Filled 2014-12-16: qty 200

## 2014-12-16 MED ORDER — DIPHENHYDRAMINE HCL 25 MG PO CAPS: 25.0000 mg | ORAL_CAPSULE | Freq: Every evening | ORAL | Status: DC | PRN

## 2014-12-16 MED ORDER — AMIODARONE HCL IN DEXTROSE 360-4.14 MG/200ML-% IV SOLN
60.0000 mg/h | INTRAVENOUS | Status: AC
  Administered 2014-12-16 (×2): 60 mg/h via INTRAVENOUS
  Filled 2014-12-16: qty 200

## 2014-12-16 MED ORDER — AMIODARONE HCL IN DEXTROSE 360-4.14 MG/200ML-% IV SOLN
60.0000 mg/h | INTRAVENOUS | Status: DC
  Administered 2014-12-17 – 2014-12-21 (×8): 30 mg/h via INTRAVENOUS
  Administered 2014-12-21: 60 mg/h via INTRAVENOUS
  Administered 2014-12-21: 150 mg/h via INTRAVENOUS
  Administered 2014-12-21: 30 mg/h via INTRAVENOUS
  Administered 2014-12-22 – 2014-12-23 (×5): 60 mg/h via INTRAVENOUS
  Filled 2014-12-16 (×27): qty 200

## 2014-12-16 MED ORDER — FUROSEMIDE 40 MG PO TABS
40.0000 mg | ORAL_TABLET | Freq: Two times a day (BID) | ORAL | Status: DC
  Administered 2014-12-16 – 2014-12-19 (×7): 40 mg via ORAL
  Filled 2014-12-16 (×7): qty 1

## 2014-12-16 MED ORDER — AMIODARONE LOAD VIA INFUSION
150.0000 mg | Freq: Once | INTRAVENOUS | Status: AC
  Administered 2014-12-16: 150 mg via INTRAVENOUS
  Filled 2014-12-16: qty 83.34

## 2014-12-16 MED ORDER — SODIUM CHLORIDE 0.9 % IJ SOLN: 10.0000 mL | INTRAMUSCULAR | Status: DC | PRN

## 2014-12-16 MED ORDER — SODIUM CHLORIDE 0.9 % IJ SOLN
10.0000 mL | Freq: Two times a day (BID) | INTRAMUSCULAR | Status: DC
  Administered 2014-12-16 – 2014-12-19 (×3): 10 mL

## 2014-12-16 NOTE — Progress Notes (Deleted)
ANTICOAGULATION CONSULT NOTE - Follow Up Consult  Pharmacy Consult for heparin Indication: IABP  No Known Allergies  Patient Measurements: Height: 5\' 7"  (170.2 cm) Weight: 171 lb 1.2 oz (77.6 kg) IBW/kg (Calculated) : 66.1 Heparin Dosing Weight: 78.6 kg  Vital Signs: Temp: 99.3 F (37.4 C) (08/28 1300) Temp Source: Core (Comment) (08/28 1200) BP: 127/82 mmHg (08/28 1300) Pulse Rate: 97 (08/28 1300)  Labs:  Recent Labs  12/14/14 1203 12/14/14 1359  12/14/14 2025 12/15/14 0300  12/15/14 2045 12/16/14 0440 12/16/14 0445 12/16/14 1228  HGB 13.8 13.8  --   --  12.8*  --   --  14.5  --   --   HCT 43.3 42.0  --   --  39.3  --   --  44.3  --   --   PLT 232 235  --   --  271  --   --  199  --   --   APTT  --   --   < > 39* 43*  < > 62* 51*  --  67*  LABPROT 24.5*  --   --   --   --   --   --   --   --   --   INR 2.23*  --   --   --   --   --   --   --   --   --   HEPARINUNFRC  --   --   --  1.97* >2.20*  --   --   --  0.81*  --   CREATININE 2.33* 2.41*  --   --  2.36*  --   --  1.88*  --   --   < > = values in this interval not displayed.  Estimated Creatinine Clearance: 45.9 mL/min (by C-G formula based on Cr of 1.88).   Assessment: 44 yo m with longstanding severe cardiomyopathy. Also has a history of chronic afib and DVT on Eliquis PTA. Last dose of Eliquis taken 8/26.  Pharmacy is consulted to dose heparin for IABP and afib.  Pharmacy has been using aPTT to monitor heparin and guide dosing as Eliquis is affecting heparin levels. -aPTT= 67 and at goal after increase to 1100 units/hr. HL is 0.81 (likely still seeing effect of apixiban) -plans noted for possible VAD  Goal of Therapy:  Heparin level 0.2-.0.5 units/ml aPTT 60-85 seconds Monitor platelets by anticoagulation protocol: Yes   Plan:  Continue heparin infusion at 1100 units/hr Heparin level and aPTT in am  Hildred Laser, Pharm D 12/16/2014 2:18 PM

## 2014-12-16 NOTE — Progress Notes (Signed)
ANTICOAGULATION CONSULT NOTE - Follow Up Consult  Pharmacy Consult for heparin Indication: IABP   Labs:  Recent Labs  12/14/14 1203 12/14/14 1359  12/14/14 2025 12/15/14 0300 12/15/14 1221 12/15/14 2045 12/16/14 0440 12/16/14 0445  HGB 13.8 13.8  --   --  12.8*  --   --  14.5  --   HCT 43.3 42.0  --   --  39.3  --   --  44.3  --   PLT 232 235  --   --  271  --   --  199  --   APTT  --   --   < > 39* 43* 66* 62* 51*  --   LABPROT 24.5*  --   --   --   --   --   --   --   --   INR 2.23*  --   --   --   --   --   --   --   --   HEPARINUNFRC  --   --   --  1.97* >2.20*  --   --   --  0.81*  CREATININE 2.33* 2.41*  --   --  2.36*  --   --  1.88*  --   < > = values in this interval not displayed.    Assessment: 44yo male now subtherapeutic on heparin after two PTTs at goal though had been trending down.  Goal of Therapy:  aPTT 60-85 seconds   Plan:  Will increase heparin gtt by 2 units/kg/hr to 1100 units/hr and check PTT in 6hr.  Wynona Neat, PharmD, BCPS  12/16/2014,6:31 AM

## 2014-12-16 NOTE — Progress Notes (Signed)
At 0700pm PA wave formed dampened, RN unable to return appropriate wave form after leveling, flushing and zeroing. STAT xray was ordered to verify swan placement. Radiology called RN and reported results while cardiac fellow was at bedside. Please see Cardiology note for further actions taken. Pt is currently comfortable and v/s remain stable. Will continue to monitor.

## 2014-12-16 NOTE — Progress Notes (Signed)
Peripherally Inserted Central Catheter/Midline Placement  The IV Nurse has discussed with the patient and/or persons authorized to consent for the patient, the purpose of this procedure and the potential benefits and risks involved with this procedure.  The benefits include less needle sticks, lab draws from the catheter and patient may be discharged home with the catheter.  Risks include, but not limited to, infection, bleeding, blood clot (thrombus formation), and puncture of an artery; nerve damage and irregular heat beat.  Alternatives to this procedure were also discussed.  PICC/Midline Placement Documentation  PICC Triple Lumen 12/16/14 PICC Brachial 42 cm 0 cm (Active)  Indication for Insertion or Continuance of Line Administration of hyperosmolar/irritating solutions (i.e. TPN, Vancomycin, etc.) 12/16/2014  2:36 PM  Exposed Catheter (cm) 0 cm 12/16/2014  2:36 PM  Site Assessment Clean;Dry;Intact 12/16/2014  2:36 PM  Lumen #1 Status Flushed;Saline locked;Blood return noted 12/16/2014  2:36 PM  Lumen #2 Status Flushed;Saline locked;Blood return noted 12/16/2014  2:36 PM  Lumen #3 Status Flushed;Saline locked;Blood return noted 12/16/2014  2:36 PM  Dressing Type Transparent 12/16/2014  2:36 PM  Dressing Change Due 12/23/14 12/16/2014  2:36 PM       Gordan Payment 12/16/2014, 2:38 PM

## 2014-12-16 NOTE — Progress Notes (Signed)
Advanced Heart Failure Rounding Note   Subjective:    [name] is a 44 y.o. 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  Admitted 8/26 with cardiogenic shock. PCWP 35 and co-ox 20%  IABP placed 8/26. On milrinone and levophed. Continues to feel better. Diuresing well. Weight down 4 pounds. Cr improved 2.4 -> 2.3 -> 1.88 (baseline about 1.5)  Swan numbers  CVP 5 PA 38/20 (27) PCWP 20 with prominent v-waves Thermo inaccurate  Co-ox 62%     Objective:   Weight Range:  Vital Signs:   Temp:  [98.1 F (36.7 C)-98.8 F (37.1 C)] 98.8 F (37.1 C) (08/28 1200) Pulse Rate:  [33-118] 92 (08/28 1200) Resp:  [15-27] 22 (08/28 1200) BP: (102-151)/(60-106) 124/87 mmHg (08/28 1200) SpO2:  [94 %-100 %] 100 % (08/28 1200) Weight:  [171 lb 1.2 oz (77.6 kg)] 171 lb 1.2 oz (77.6 kg) (08/28 0500)    Weight change: Filed Weights   12/14/14 1400 12/15/14 0337 12/16/14 0500  Weight: 173 lb 4.5 oz (78.6 kg) 175 lb 0.7 oz (79.4 kg) 171 lb 1.2 oz (77.6 kg)    Intake/Output:   Intake/Output Summary (Last 24 hours) at 12/16/14 1223 Last data filed at 12/16/14 1200  Gross per 24 hour  Intake 1298.2 ml  Output   3175 ml  Net -1876.8 ml     Physical Exam: General: Lying in bed  HEENT: Normal.  Neck: RIJ swan Supple, JVP 5-6 no carotid bruits or masses noted.  Cardiac: Irregular irregular rate and rhythm. III/VI systolic murmur at the apex noted. + s3. Respiratory: Lungs are clear to auscultation bilaterally with normal work of breathing.  GI: Soft and nontender. Belly distended Extremities: RFem IABP. Warm trace edema. DPs + bilaterally  Skin: Warm and dry. Color is normal.  Neuro: Strength and sensation are intact and no gross focal deficits noted.  Psych: Alert, appropriate and with normal affect.  Telemetry: AF  Labs: Basic Metabolic Panel:  Recent Labs Lab 12/10/14 1214  12/14/14 1203 12/14/14 1359 12/15/14 0300 12/15/14 0652 12/16/14 0440  NA 136 136 136 134*  --  135  K 5.2* 5.0 5.2* 4.0  --  3.9  CL 102 103 101 99*  --  101  CO2 26 22 25 27   --  27  GLUCOSE 110* 124* 104* 146*  --  131*  BUN 35* 41* 43* 43*  --  31*  CREATININE 2.02* 2.33* 2.41* 2.36*  --  1.88*  CALCIUM 9.5 9.3 9.1 9.0  --  8.8*  MG  --   --   --   --  2.0  --     Liver Function Tests:  Recent Labs Lab 12/10/14 1214 12/14/14 1203 12/14/14 1359  AST 20 25 26   ALT 25 36 36  ALKPHOS 82 89 88  BILITOT 1.1 0.9 1.1  PROT 7.5 8.3* 7.7  ALBUMIN 3.8 3.8 3.5    Recent Labs Lab 12/10/14 1214  LIPASE 16.0  AMYLASE 41   No results for input(s): AMMONIA in the last 168 hours.  CBC:  Recent Labs Lab 12/10/14 1214 12/14/14 1203 12/14/14 1359 12/15/14 0300 12/16/14 0440  WBC 7.8 6.4 6.1 6.4 7.2  NEUTROABS  --   --  2.9  --   --   HGB 13.7 13.8 13.8 12.8* 14.5  HCT 46.1 43.3 42.0 39.3 44.3  MCV 71.5* 68.5* 67.4*  66.3* 66.8*  PLT 287.0 232 235 271 199    Cardiac Enzymes: No results for input(s): CKTOTAL, CKMB, CKMBINDEX, TROPONINI in the last 168 hours.  BNP: BNP (last 3 results)  Recent Labs  11/30/14 1325 12/14/14 1203 12/14/14 1359  BNP 857.9* 1341.9* 1473.6*    ProBNP (last 3 results)  Recent Labs  12/10/14 1214  PROBNP 1807.0*      Other results:  Imaging: Dg Chest Port 1 View  12/16/2014   CLINICAL DATA:  CHF, chronic systolic heart failure, atrial fibrillation, valvular heart disease  EXAM: PORTABLE CHEST - 1 VIEW  COMPARISON:  Portable exam 0509 hours compared 12/15/2014  FINDINGS: Swan-Ganz catheter is been partially withdrawn, tip projecting over RIGHT pulmonary artery.  LEFT subclavian AICD lead tip projects over RIGHT ventricle.  Tip of intra-aortic balloon pump projects over aortic arch unchanged, consider withdrawal 3 cm to place tip below aortic arch.  Enlargement of cardiac silhouette with pulmonary vascular congestion.  Probable  mild perihilar edema.  No segmental consolidation, pleural effusion or pneumothorax.  IMPRESSION: Enlargement of cardiac silhouette with vascular congestion and question minimal pulmonary edema.  Tip of intra-aortic balloon pump projects over aortic arch, consider withdrawal 3 cm to place below aortic arch.  Findings called to [place] on [facility] on 12/16/2014 at 0924 hr.   Electronically Signed   By: Lavonia Dana M.D.   On: 12/16/2014 09:24   Dg Chest Port 1 View  12/15/2014   CLINICAL DATA:  Central line placement, history atrial fibrillation, chronic systolic heart failure, valvular heart disease, hypertension  EXAM: PORTABLE CHEST - 1 VIEW  COMPARISON:  Portable exam 1934 hours compared to 11/09/2014  FINDINGS: RIGHT jugular Swan-Ganz catheter with tip projecting over RIGHT lower lobe pulmonary artery ; this appears more peripheral than typically identified.  LEFT subclavian AICD lead tip projects over RIGHT ventricle.  Tip of intra-aortic balloon pump projects over aortic arch.  Enlargement of cardiac silhouette with pulmonary vascular congestion.  No gross pulmonary infiltrate, pleural effusion, or pneumothorax.  Osseous structures unremarkable.  IMPRESSION: Tip of RIGHT jugular Swan-Ganz catheter projects over RIGHT lower lobe pulmonary artery, more peripheral than typically visualized; recommend confirmation of position with wedge pressure assessment.  Tip of intra-aortic balloon pump projects over aortic arch; recommend withdrawal 2 cm to place tip below aortic arch.  Enlargement of cardiac silhouette post AICD.  Findings called to [place] on [facility] on 12/15/2014 at 1940 hours.   Electronically Signed   By: Lavonia Dana M.D.   On: 12/15/2014 19:41     Medications:     Scheduled Medications: . Chlorhexidine Gluconate Cloth  6 each Topical Daily  . furosemide  80 mg Intravenous BID  . mupirocin ointment  1 application Nasal BID  . pneumococcal 23 valent vaccine  0.5 mL Intramuscular Tomorrow-1000   . potassium chloride  20 mEq Oral BID  . sodium chloride  3 mL Intravenous Q12H  . sodium chloride  3 mL Intravenous Q12H  . sodium chloride  3 mL Intravenous Q12H    Infusions: . heparin 1,100 Units/hr (12/16/14 0800)  . milrinone 0.375 mcg/kg/min (12/16/14 0800)  . norepinephrine (LEVOPHED) Adult infusion 10 mcg/min (12/16/14 0800)    PRN Medications: sodium chloride, sodium chloride, sodium chloride, acetaminophen, fentaNYL, midazolam, ondansetron (ZOFRAN) IV, sodium chloride, sodium chloride, sodium chloride   Assessment:   1. Cardiogenic shock 2. Acute/chonic systolic HF      -- NICM EF 20-25% 3. Chronic AF on Eliquis 4. Severe MR 5.  Acute on chronic renal failure, stage III-IV (creatinine 1.4 -> 2.0)  Plan/Discussion:    Cardiac output much improved on IABP and dual inotropes. Renal function now under 2.0  Volume status much improved.  Will place PICC. Pull Swan. Change to po diuretics. Continue IABP, levophed and milrinone.   Remains on heparin for IABP and chronic AF.  Will need VAD this week if family support and insurance in place. Will discuss at [place] tomorrow.  Dr. Prescott Gum and VAD team have seen.   The patient is critically ill with multiple organ systems failure and requires high complexity decision making for assessment and support, frequent evaluation and titration of therapies, application of advanced monitoring technologies and extensive interpretation of multiple databases.   Critical Care Time devoted to patient care services described in this note is 35 Minutes.   Length of Stay: 2  Glori Bickers MD 12/16/2014, 12:23 PM  Advanced Heart Failure Team Pager [phone] (M-F; [place])  Please contact [facility] for night-coverage after hours (4p -7a ) and weekends on [email or web address]

## 2014-12-16 NOTE — Progress Notes (Signed)
Amiodarone Drug - Drug Interaction Consult Note  Recommendations: Pt is being diuresed with furosemide. Be sure to monitor for hypokalemia.   Amiodarone is metabolized by the cytochrome P450 system and therefore has the potential to cause many drug interactions. Amiodarone has an average plasma half-life of 50 days (range 20 to 100 days).   There is potential for drug interactions to occur several weeks or months after stopping treatment and the onset of drug interactions may be slow after initiating amiodarone.   []  Statins: Increased risk of myopathy. Simvastatin- restrict dose to 20mg  daily. Other statins: counsel patients to report any muscle pain or weakness immediately.  []  Anticoagulants: Amiodarone can increase anticoagulant effect. Consider warfarin dose reduction. Patients should be monitored closely and the dose of anticoagulant altered accordingly, remembering that amiodarone levels take several weeks to stabilize.  []  Antiepileptics: Amiodarone can increase plasma concentration of phenytoin, the dose should be reduced. Note that small changes in phenytoin dose can result in large changes in levels. Monitor patient and counsel on signs of toxicity.  []  Beta blockers: increased risk of bradycardia, AV block and myocardial depression. Sotalol - avoid concomitant use.  []   Calcium channel blockers (diltiazem and verapamil): increased risk of bradycardia, AV block and myocardial depression.  []   Cyclosporine: Amiodarone increases levels of cyclosporine. Reduced dose of cyclosporine is recommended.  []  Digoxin dose should be halved when amiodarone is started.  [x]  Diuretics: increased risk of cardiotoxicity if hypokalemia occurs.  []  Oral hypoglycemic agents (glyburide, glipizide, glimepiride): increased risk of hypoglycemia. Patient's glucose levels should be monitored closely when initiating amiodarone therapy.   []  Drugs that prolong the QT interval:  Torsades de pointes risk  may be increased with concurrent use - avoid if possible.  Monitor QTc, also keep magnesium/potassium WNL if concurrent therapy can't be avoided. Marland Kitchen Antibiotics: e.g. fluoroquinolones, erythromycin. . Antiarrhythmics: e.g. quinidine, procainamide, disopyramide, sotalol. . Antipsychotics: e.g. phenothiazines, haloperidol.  . Lithium, tricyclic antidepressants, and methadone.  Thank You,  Andrey Cota. Diona Foley, PharmD Clinical Pharmacist Pager [phone]  12/16/2014 6:03 PM

## 2014-12-16 NOTE — Progress Notes (Signed)
ANTICOAGULATION CONSULT NOTE - Follow Up Consult  Pharmacy Consult for heparin Indication: IABP  No Known Allergies  Patient Measurements: Height: 5\' 7"  (170.2 cm) Weight: 171 lb 1.2 oz (77.6 kg) IBW/kg (Calculated) : 66.1  Vital Signs: Temp: 99.3 F (37.4 C) (08/28 1300) Temp Source: Core (Comment) (08/28 1200) BP: 127/82 mmHg (08/28 1300) Pulse Rate: 97 (08/28 1300)  Labs:  Recent Labs  12/14/14 1203 12/14/14 1359  12/14/14 2025 12/15/14 0300  12/15/14 2045 12/16/14 0440 12/16/14 0445 12/16/14 1228  HGB 13.8 13.8  --   --  12.8*  --   --  14.5  --   --   HCT 43.3 42.0  --   --  39.3  --   --  44.3  --   --   PLT 232 235  --   --  271  --   --  199  --   --   APTT  --   --   < > 39* 43*  < > 62* 51*  --  67*  LABPROT 24.5*  --   --   --   --   --   --   --   --   --   INR 2.23*  --   --   --   --   --   --   --   --   --   HEPARINUNFRC  --   --   --  1.97* >2.20*  --   --   --  0.81*  --   CREATININE 2.33* 2.41*  --   --  2.36*  --   --  1.88*  --   --   < > = values in this interval not displayed.  Estimated Creatinine Clearance: 45.9 mL/min (by C-G formula based on Cr of 1.88).  Assessment: 44 yo m with longstanding cardiomyopathy. Patient is on Eliquis at home for previous DVT and afib. Pharmacy is consulted to dose heparin while inpatient. Patient has IABP s/p cath and pharmacy is using aPTTs to monitor heparin as Eliquis is still affecting heparin levels (HL 0.81 this AM).  The aPTT is therapeutic today at 67 on 1100 units/hr. CBC stable, no issues per RN.  Patient is being evaluated for an LVAD.  Goal of Therapy:  aPTT 60-85 seconds Monitor platelets by anticoagulation protocol: Yes   Plan:  Continue heparin infusion at 1100 units/hr 6-hr confirmatory aPTT 1830  Deontae Robson L. Nicole Kindred, PharmD Clinical Pharmacy Resident Pager: [phone] 12/16/2014 2:19 PM

## 2014-12-16 NOTE — Progress Notes (Signed)
ANTICOAGULATION CONSULT NOTE - Follow Up Consult  Pharmacy Consult for heparin Indication: IABP  No Known Allergies  Patient Measurements: Height: 5\' 7"  (170.2 cm) Weight: 171 lb 1.2 oz (77.6 kg) IBW/kg (Calculated) : 66.1  Vital Signs: Temp: 98.8 F (37.1 C) (08/28 1600) Temp Source: Oral (08/28 1600) BP: 161/82 mmHg (08/28 1800) Pulse Rate: 67 (08/28 1800)  Labs:  Recent Labs  12/14/14 1203 12/14/14 1359  12/14/14 2025 12/15/14 0300  12/16/14 0440 12/16/14 0445 12/16/14 1228 12/16/14 1826  HGB 13.8 13.8  --   --  12.8*  --  14.5  --   --   --   HCT 43.3 42.0  --   --  39.3  --  44.3  --   --   --   PLT 232 235  --   --  271  --  199  --   --   --   APTT  --   --   < > 39* 43*  < > 51*  --  67* 72*  LABPROT 24.5*  --   --   --   --   --   --   --   --   --   INR 2.23*  --   --   --   --   --   --   --   --   --   HEPARINUNFRC  --   --   --  1.97* >2.20*  --   --  0.81*  --   --   CREATININE 2.33* 2.41*  --   --  2.36*  --  1.88*  --   --   --   < > = values in this interval not displayed.  Estimated Creatinine Clearance: 45.9 mL/min (by C-G formula based on Cr of 1.88).  Assessment: 44 yo m with longstanding cardiomyopathy. Patient is on Eliquis at home for previous DVT and afib. Pharmacy is consulted to dose heparin while inpatient. Patient has IABP s/p cath and pharmacy is using aPTTs to monitor heparin as Eliquis is still affecting heparin levels (HL 0.81 this AM).  The aPTT is therapeutic today at 67 on 1100 units/hr. CBC stable, no issues per RN.  Patient is being evaluated for an LVAD.  Confirmatory aPTT remains therapeutic at 72 sec on heparin 1100 units/hr. No issues with infusion or bleeding noted.  Goal of Therapy:  aPTT 60-85 seconds Monitor platelets by anticoagulation protocol: Yes   Plan:  Continue heparin infusion at 1100 units/hr Daily aPTT/HL/CBC Monitor s/sx of bleeding  Andrey Cota. Diona Foley, PharmD Clinical Pharmacist Pager [phone]  12/16/2014  6:50 PM

## 2014-12-17 ENCOUNTER — Other Ambulatory Visit (HOSPITAL_COMMUNITY): Payer: Self-pay | Admitting: Respiratory Therapy

## 2014-12-17 ENCOUNTER — Ambulatory Visit (HOSPITAL_COMMUNITY): Payer: 59

## 2014-12-17 ENCOUNTER — Inpatient Hospital Stay (HOSPITAL_COMMUNITY): Payer: 59

## 2014-12-17 ENCOUNTER — Encounter (HOSPITAL_COMMUNITY): Payer: Self-pay | Admitting: *Deleted

## 2014-12-17 DIAGNOSIS — I2589 Other forms of chronic ischemic heart disease: Secondary | ICD-10-CM

## 2014-12-17 DIAGNOSIS — Z01810 Encounter for preprocedural cardiovascular examination: Secondary | ICD-10-CM

## 2014-12-17 DIAGNOSIS — I5023 Acute on chronic systolic (congestive) heart failure: Secondary | ICD-10-CM

## 2014-12-17 DIAGNOSIS — I429 Cardiomyopathy, unspecified: Secondary | ICD-10-CM

## 2014-12-17 DIAGNOSIS — Z515 Encounter for palliative care: Secondary | ICD-10-CM

## 2014-12-17 DIAGNOSIS — Z01818 Encounter for other preprocedural examination: Secondary | ICD-10-CM

## 2014-12-17 LAB — SPIROMETRY WITH GRAPH
FEF 25-75 Post: 1.42 L/sec
FEF 25-75 Pre: 2.4 L/sec
FEF2575-%Change-Post: -40 %
FEF2575-%Pred-Post: 44 %
FEF2575-%Pred-Pre: 74 %
FEV1-%Change-Post: -9 %
FEV1-%Pred-Post: 56 %
FEV1-%Pred-Pre: 62 %
FEV1-Post: 1.73 L
FEV1-Pre: 1.92 L
FEV1FVC-%Change-Post: -13 %
FEV1FVC-%Pred-Pre: 105 %
FEV6-%Change-Post: 2 %
FEV6-%Pred-Post: 61 %
FEV6-%Pred-Pre: 60 %
FEV6-Post: 2.29 L
FEV6-Pre: 2.24 L
FEV6FVC-%Change-Post: -2 %
FEV6FVC-%Pred-Post: 100 %
FEV6FVC-%Pred-Pre: 103 %
FVC-%Change-Post: 4 %
FVC-%Pred-Post: 61 %
FVC-%Pred-Pre: 58 %
FVC-Post: 2.34 L
FVC-Pre: 2.24 L
Post FEV1/FVC ratio: 74 %
Post FEV6/FVC ratio: 98 %
Pre FEV1/FVC ratio: 86 %
Pre FEV6/FVC Ratio: 100 %

## 2014-12-17 LAB — BASIC METABOLIC PANEL
Anion gap: 10 (ref 5–15)
BUN: 24 mg/dL — AB (ref 6–20)
CHLORIDE: 94 mmol/L — AB (ref 101–111)
CO2: 24 mmol/L (ref 22–32)
Calcium: 8.4 mg/dL — ABNORMAL LOW (ref 8.9–10.3)
Creatinine, Ser: 1.48 mg/dL — ABNORMAL HIGH (ref 0.61–1.24)
GFR, EST NON AFRICAN AMERICAN: 55 mL/min — AB (ref >60)
Glucose, Bld: 269 mg/dL — ABNORMAL HIGH (ref 65–99)
POTASSIUM: 4 mmol/L (ref 3.5–5.1)
SODIUM: 128 mmol/L — AB (ref 135–145)

## 2014-12-17 LAB — BLOOD GAS, ARTERIAL
Acid-Base Excess: 0.5 mmol/L (ref 0.0–2.0)
Bicarbonate: 24 mEq/L (ref 20.0–24.0)
Drawn by: 275531
FIO2: 0.21
O2 SAT: 97.8 %
PH ART: 7.453 — AB (ref 7.350–7.450)
Patient temperature: 98.6
TCO2: 25.1 mmol/L (ref 0–100)
pCO2 arterial: 34.9 mmHg — ABNORMAL LOW (ref 35.0–45.0)
pO2, Arterial: 93.7 mmHg (ref 80.0–100.0)

## 2014-12-17 LAB — SURGICAL PCR SCREEN
MRSA, PCR: NEGATIVE
Staphylococcus aureus: POSITIVE — AB

## 2014-12-17 LAB — CARBOXYHEMOGLOBIN
Carboxyhemoglobin: 1.3 % (ref 0.5–1.5)
METHEMOGLOBIN: 1.3 % (ref 0.0–1.5)
O2 SAT: 58.1 %
TOTAL HEMOGLOBIN: 14.6 g/dL (ref 13.5–18.0)

## 2014-12-17 LAB — HEPATITIS B SURFACE ANTIBODY,QUALITATIVE: Hep B S Ab: NONREACTIVE

## 2014-12-17 LAB — HEPARIN LEVEL (UNFRACTIONATED)
HEPARIN UNFRACTIONATED: 0.46 [IU]/mL (ref 0.30–0.70)
Heparin Unfractionated: 0.67 IU/mL (ref 0.30–0.70)

## 2014-12-17 LAB — APTT
aPTT: 89 seconds — ABNORMAL HIGH (ref 24–37)
aPTT: 68 seconds — ABNORMAL HIGH (ref 24–37)

## 2014-12-17 LAB — CBC
HEMATOCRIT: 43.5 % (ref 39.0–52.0)
Hemoglobin: 14 g/dL (ref 13.0–17.0)
MCH: 21.2 pg — ABNORMAL LOW (ref 26.0–34.0)
MCHC: 32.2 g/dL (ref 30.0–36.0)
MCV: 65.9 fL — AB (ref 78.0–100.0)
PLATELETS: 190 10*3/uL (ref 150–400)
RBC: 6.6 MIL/uL — ABNORMAL HIGH (ref 4.22–5.81)
RDW: 16.9 % — AB (ref 11.5–15.5)
WBC: 10.1 10*3/uL (ref 4.0–10.5)

## 2014-12-17 LAB — HEMOGLOBIN A1C
Hgb A1c MFr Bld: 7.2 % — ABNORMAL HIGH (ref 4.8–5.6)
MEAN PLASMA GLUCOSE: 160 mg/dL

## 2014-12-17 LAB — HEPATITIS B CORE ANTIBODY, TOTAL: Hep B Core Total Ab: NEGATIVE

## 2014-12-17 LAB — HEPATITIS C ANTIBODY

## 2014-12-17 LAB — MAGNESIUM: MAGNESIUM: 1.8 mg/dL (ref 1.7–2.4)

## 2014-12-17 MED ORDER — MAGNESIUM SULFATE 2 GM/50ML IV SOLN
2.0000 g | Freq: Once | INTRAVENOUS | Status: AC
  Administered 2014-12-17: 2 g via INTRAVENOUS
  Filled 2014-12-17: qty 50

## 2014-12-17 MED ORDER — ALBUTEROL SULFATE (2.5 MG/3ML) 0.083% IN NEBU
2.5000 mg | INHALATION_SOLUTION | Freq: Once | RESPIRATORY_TRACT | Status: AC
  Administered 2014-12-17: 2.5 mg via RESPIRATORY_TRACT

## 2014-12-17 MED ORDER — FENTANYL CITRATE (PF) 100 MCG/2ML IJ SOLN
INTRAMUSCULAR | Status: AC
  Administered 2014-12-17: 50 ug via INTRAVENOUS
  Filled 2014-12-17: qty 2

## 2014-12-17 NOTE — Consult Note (Signed)
Consultation Note Date: 12/17/2014   Patient Name: [name]  DOB: 02/26/1971  MRN: [id number]  Age / Sex: 44 y.o., male   PCP: Yun Sun, MD Referring Physician: Daniel R [name], MD  Reason for Consultation: Establishing goals of care  Palliative Care Assessment and Plan Summary of Established Goals of Care and Medical Treatment Preferences    Palliative Care Discussion Held Today:   I met today with Mr. Orzechowski and his 2 sisters at bedside to discuss LVAD. Sisters are very supportive but also live out of town but can come for a week each to be with him at discharge after LVAD placed. Mr. Ehrman is extremely overwhelmed at this time and fearful of what the future holds. I did not press him on the risks and complications today due to his fear as he has already been educated on these. I did encourage him to consider HCPOA and he expresses interest in completing this today.  We discussed different ways to cope and relax during this process. Discussed taking one day and one step at a time and not taking it all on at once - focusing on trying to relax, sleep health, and allow his body to be optimized for surgery. Encouragement given to Mr. Gills and family at bedside and emotional support provided. All questions/concerns addressed. Reinforced that I will be available as support throughout this process. 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 He also enjoys travelling and we discussed how these things are attainable with LVAD. I will continue to follow.    Contacts/Participants in Discussion: Primary Decision Maker: Self, he is completing HCPOA   Goals of Care/Code Status/Advance Care Planning:   Code Status: FULL   Symptom Management:   Anxiety: Discussed therapeutic techniques to reduce anxiety including breathing techniques, distraction, imagery. He might need low dose medication prn especially if sleep becomes effected.    Denies pain.   Psycho-social/Spiritual:   Support System: Family at bedside very supportive.   Prognosis: Unable to determine - plan is for LVAD  Discharge Planning:  Home with Home Health eventually       Chief Complaint/HPI: 44 yo male with systolic CHF EF 20-25%, ICD, A fib, HTN, h/o DVT admitted with cardiogenic shock requiring levophed and milrinone with plans for likely LVAD procedure later this week.   Primary Diagnoses  Present on Admission:  **None**  Palliative Review of Systems:   + anxiety   I have reviewed the medical record, interviewed the patient and family, and examined the patient. The following aspects are pertinent.  Past Medical History  Diagnosis Date  . Chronic systolic heart failure     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%)  . Atrial fibrillation -parosysmal      Rx w amiodarone  . Noncompliance     H/O  MEDICAL NONCOMPLIANCE  . 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 Tricuspid valve regurgitation     SEVERE  . Severe mitral regurgitation   . Polymorphic ventricular tachycardia     RECURRENT WITH APPROPRIATE SHOCK THERAPY IN THE PAST  . Ventricular fibrillation     WITH APPROPRIATE SHOCK THERAPY IN THE PAST  . Hypertension   . Gout   . RA (rheumatoid arthritis)   . Automatic implantable cardiac defibrillator -BSX     single chamber  . GI bleed -massive     11 Units   2012  . Elevated LFTs   . H/O hyperthyroidism   . CHF (congestive heart failure)   . AKI (acute kidney injury)   . AICD (automatic cardioverter/defibrillator) present    Social History   Social History  . Marital Status: Single    Spouse Name: N/A  . Number of Children: N/A  . Years of Education: N/A   Occupational History  . [employer]   Social History Main Topics  . Smoking status: Never Smoker   . Smokeless tobacco: Never Used  . Alcohol Use: No  . Drug Use: No  . Sexual Activity:  Not Asked   Other Topics Concern  . None   Social History Narrative   LIVES IN [place]   SINGLE   TOBACCO USE .Marland KitchenMarland KitchenNO   ALCOHOL USE.Marland Kitchen NO   REGULAR EXERCISE.Marland Kitchen NO   DRUG USE... NO   Family History  Problem Relation Age of Onset  . Heart failure Brother    Scheduled Meds: . Chlorhexidine Gluconate Cloth  6 each Topical Daily  . furosemide  40 mg Oral BID  . mupirocin ointment  1 application Nasal BID  . pneumococcal 23 valent vaccine  0.5 mL Intramuscular Tomorrow-1000  . potassium chloride  20 mEq Oral BID  . sodium chloride  10-40 mL Intracatheter Q12H  . sodium chloride  3 mL Intravenous Q12H  . sodium chloride  3 mL Intravenous Q12H  . sodium chloride  3 mL Intravenous Q12H   Continuous Infusions: . amiodarone 30 mg/hr (12/17/14 0722)  . heparin 1,000 Units/hr (12/17/14 0800)  . milrinone 0.375 mcg/kg/min (12/17/14 0800)  . norepinephrine (LEVOPHED) Adult infusion 10 mcg/min (12/17/14 0800)   PRN Meds:.sodium chloride, sodium chloride, sodium chloride, acetaminophen, diphenhydrAMINE, fentaNYL, midazolam, ondansetron (ZOFRAN) IV, sodium chloride, sodium chloride, sodium chloride, sodium chloride Medications Prior to Admission:  Prior to Admission medications   Medication Sig Start Date End Date Taking? Authorizing Provider  allopurinol (ZYLOPRIM) 100 MG tablet Take 100 mg by mouth daily. 10/31/14  Yes Historical Provider, MD  apixaban (ELIQUIS) 5 MG TABS tablet Take 1 tablet (5 mg total) by mouth 2 (two) times daily. 08/24/14  Yes Deboraha Sprang, MD  carvedilol (COREG) 25 MG tablet Take 1 tablet (25 mg total) by mouth 2 (two) times daily with a meal. 11/26/14  Yes Deboraha Sprang, MD  furosemide (LASIX) 40 MG tablet Take 1 tablet (40 mg total) by mouth 2 (two) times daily. 12/10/14  Yes Burtis Junes, NP  spironolactone (ALDACTONE) 25 MG tablet Take 1 tablet (25 mg total) by mouth daily. 07/25/14  Yes Deboraha Sprang, MD   No Known Allergies CBC:    Component Value Date/Time    WBC 10.1 12/17/2014 0515   HGB 14.0 12/17/2014 0515   HCT 43.5 12/17/2014 0515   PLT 190 12/17/2014 0515   MCV 65.9* 12/17/2014 0515   NEUTROABS 2.9 12/14/2014 1359   LYMPHSABS 2.3 12/14/2014 1359   MONOABS 0.7 12/14/2014 1359   EOSABS 0.1 12/14/2014 1359   BASOSABS 0.1 12/14/2014 1359   Comprehensive Metabolic Panel:    Component Value Date/Time   NA 128* 12/17/2014 0515   K 4.0 12/17/2014 0515   CL 94* 12/17/2014 0515   CO2 24 12/17/2014 0515   BUN 24* 12/17/2014 0515   CREATININE 1.48* 12/17/2014 0515   GLUCOSE 269* 12/17/2014 0515   CALCIUM 8.4* 12/17/2014 0515   AST 26 12/14/2014 1359   ALT 36 12/14/2014 1359   ALKPHOS 88 12/14/2014 1359  BILITOT 1.1 12/14/2014 1359   PROT 7.7 12/14/2014 1359   ALBUMIN 3.5 12/14/2014 1359    Physical Exam:  Vital Signs: BP 137/83 mmHg  Pulse 88  Temp(Src) 98.1 F (36.7 C) (Oral)  Resp 20  Ht 5' 7" (1.702 m)  Wt 78 kg (171 lb 15.3 oz)  BMI 26.93 kg/m2  SpO2 98% SpO2: SpO2: 98 % O2 Device: O2 Device: Not Delivered O2 Flow Rate:   Intake/output summary:  Intake/Output Summary (Last 24 hours) at 12/17/14 1127 Last data filed at 12/17/14 1100  Gross per 24 hour  Intake 2391.78 ml  Output   1925 ml  Net 466.78 ml   Baseline Weight: Weight: 78.6 kg (173 lb 4.5 oz) Most recent weight: Weight: 78 kg (171 lb 15.3 oz)  Exam Findings:   General: NAD, lying in bed, tearful at times HEENT: [place]/AT, moist mucous membranes Resp: No labored breathing Neuro: Awake, alert, oriented x 3           Palliative Performance Scale: 30 %                Additional Data Reviewed: Recent Labs     12/16/14  0440  12/16/14  2055  12/17/14  0515  WBC  7.2   --   10.1  HGB  14.5   --   14.0  PLT  199   --   190  NA  135  133*  128*  BUN  31*  29*  24*  CREATININE  1.88*  1.80*  1.48*     Time In: 1050 Time Out: 1145 Time Total: 55min  Greater than 50%  of this time was spent counseling and coordinating care related to the  above assessment and plan.   Signed by:  Alicia Parker, NP Palliative Medicine Team Pager # [phone] (M-F 8a-5p) Team Phone # [phone] (Nights/Weekends)    

## 2014-12-17 NOTE — Consult Note (Signed)
[address]       ,[place] [address]             [phone]        Myles L Lienau [facility] Record [id number] Date of Birth: [birth year]  Referring: Sandi Mariscal, MD Primary Care: Sandi Mariscal, MD  Chief Complaint:   No chief complaint on file.  weakness, shortness of breath, edema, cardiogenic shock secondary to acute on chronic systolic heart failure  History of Present Illness:     Patient examined, most recent 2-D echocardiogram and right heart cath data reviewed. 44 year old  AA male with history of nonischemic cardiomyopathy for almost 10 years-followed by the EP service. He has had a AICD-pacemaker with generator change about 2 years ago. Over the past few weeks he has had progressive symptoms of heart failure with nausea, decreased exercise tolerance, edema, and orthopnea. He was admitted to the hospital for a few days in mid-August with hypotension and was found to have gastroenteritis and received IV fluids. He  was discharged and seen in follow-up in the cardiology office. He was  felt to be in  refractory heart failure and was referred to the advanced heart care clinic for the first time. Patient was seen last week and found to be in the advanced heart failure and was admitted, PA catheter placed, and intra-aortic balloon pump placed for cardiogenic shock. Patient was started on inotropes including milrinone and norepinephrine. His hemodynamics subsequently improved and his symptoms also improved. He is currently on inotropes as above with balloon pump augmentation. Venous saturation remains approximately 55-60%. Creatinine has improved from 2.4-1.4. The patient's Eliquis  has been stopped on admission and he was placed on heparin. He has a long history of atrial fibrillation.   Echocardiogram performed in mid-August demonstrated severe LV dilatation with end-diastolic diameter 7-8 centimeters, moderate to severe MR, moderate TR, moderate RV  dysfunction.  Right heart cath data has demonstrated elevated wedge with CVP-wedge ratio approximately 0.3.  The patient denies any episodes of bleeding when he was taking Coumadin or the NOAC. He has not had colonoscopy. Patient had a cholecystectomy in the past without anesthesia complications.  The patient is right-hand dominant.  He has never had a stroke from his A. Fib   Patient Activity/ Functional Status: Patient lives alone. Unemployed from disability. Family lives in [place].    Zubrod Score: At the time of surgery this patient's most appropriate activity status/level should be described as: []     0    Normal activity, no symptoms []     1    Restricted in physical strenuous activity but ambulatory, able to do out light work []     2    Ambulatory and capable of self care, unable to do work activities, up and about                 more than 50%  Of the time                            []     3    Only limited self care, in bed greater than 50% of waking hours [x]     4    Completely disabled, no self care, confined to bed or chair []     5    Moribund  Past Medical History  Diagnosis Date  . Chronic systolic heart failure     secondary to  nonischemic cardiomyopathy (EF 25-3%)  . Atrial fibrillation -parosysmal      Rx w amiodarone  . Noncompliance     H/O  MEDICAL NONCOMPLIANCE  . 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 Tricuspid valve regurgitation     SEVERE  . Severe mitral regurgitation   . Polymorphic ventricular tachycardia     RECURRENT WITH APPROPRIATE SHOCK THERAPY IN THE PAST  . Ventricular fibrillation     WITH APPROPRIATE SHOCK THERAPY IN THE PAST  . Hypertension   . Gout   . RA (rheumatoid arthritis)   . Automatic implantable cardiac defibrillator -BSX     single chamber  . GI bleed -massive     11 Units 2012  . Elevated LFTs   . H/O hyperthyroidism   . CHF (congestive heart failure)   . AKI (acute kidney  injury)   . AICD (automatic cardioverter/defibrillator) present     Past Surgical History  Procedure Laterality Date  . Cardiac catheterization  06/2006    RIGHT HEART CATH SHOWING SEVERE BIVENTRICUALR CHF WITH MARKED FILLING AND PRESSURES  . Insert / replace / remove pacemaker      GUIDANT HE ICD MODEL 2180, SERIAL # [id number]  . Cholecystectomy    . Implantable cardioverter defibrillator generator change N/A 07/01/2011    Procedure: IMPLANTABLE CARDIOVERTER DEFIBRILLATOR GENERATOR CHANGE;  Surgeon: Deboraha Sprang, MD;  Location: [facility] CATH LAB;  Service: Cardiovascular;  Laterality: N/A;  . Tee without cardioversion N/A 12/12/2014    Procedure: TRANSESOPHAGEAL ECHOCARDIOGRAM (TEE);  Surgeon: Jerline Pain, MD;  Location: [facility];  Service: Cardiovascular;  Laterality: N/A;  . Cardiac catheterization N/A 12/14/2014    Procedure: Right Heart Cath;  Surgeon: Jolaine Artist, MD;  Location: [facility] LAB;  Service: Cardiovascular;  Laterality: N/A;  . Cardiac catheterization N/A 12/14/2014    Procedure: IABP Insertion;  Surgeon: Jolaine Artist, MD;  Location: [facility];  Service: Cardiovascular;  Laterality: N/A;    History  Smoking status  . Never Smoker   Smokeless tobacco  . Never Used    History  Alcohol Use No    Social History   Social History  . Marital Status: Single    Spouse Name: N/A  . Number of Children: N/A  . Years of Education: N/A   Occupational History  . [employer]   Social History Main Topics  . Smoking status: Never Smoker   . Smokeless tobacco: Never Used  . Alcohol Use: No  . Drug Use: No  . Sexual Activity: Not on file   Other Topics Concern  . Not on file   Social History Narrative   LIVES IN [place]   SINGLE   TOBACCO USE .Marland KitchenMarland KitchenNO   ALCOHOL USE.Marland Kitchen NO   REGULAR EXERCISE.Marland Kitchen NO   DRUG USE... NO    No Known Allergies  Current Facility-Administered Medications  Medication Dose Route Frequency Provider  Last Rate Last Dose  . 0.9 %  sodium chloride infusion  250 mL Intravenous PRN Satira Mccallum Tillery, PA-C      . 0.9 %  sodium chloride infusion  250 mL Intravenous PRN Jolaine Artist, MD 10 mL/hr at 12/16/14 1600 250 mL at 12/16/14 1600  . 0.9 %  sodium chloride infusion  250 mL Intravenous PRN Jolaine Artist, MD      . acetaminophen (TYLENOL) tablet 650 mg  650 mg Oral Q4H PRN Shirley Friar,  PA-C   650 mg at 12/16/14 2203  . amiodarone (NEXTERONE PREMIX) 360 MG/200ML (1.8 mg/mL) IV infusion  30 mg/hr Intravenous Continuous [name], MD 16.7 mL/hr at 12/17/14 0722 30 mg/hr at 12/17/14 0722  . Chlorhexidine Gluconate Cloth 2 % PADS 6 each  6 each Topical Daily Jolaine Artist, MD   6 each at 12/17/14 1123  . diphenhydrAMINE (BENADRYL) capsule 25 mg  25 mg Oral QHS PRN Jolaine Artist, MD      . fentaNYL (SUBLIMAZE) bolus via infusion 25-50 mcg  25-50 mcg Intravenous Q1H PRN Jolaine Artist, MD      . furosemide (LASIX) tablet 40 mg  40 mg Oral BID Jolaine Artist, MD   40 mg at 12/17/14 0830  . heparin ADULT infusion 100 units/mL (25000 units/250 mL)  1,000 Units/hr Intravenous Continuous Franky Macho, RPH 10 mL/hr at 12/17/14 0800 1,000 Units/hr at 12/17/14 0800  . midazolam (VERSED) bolus via infusion 1-2 mg  1-2 mg Intravenous Q4H PRN Jolaine Artist, MD      . milrinone (PRIMACOR) 20 MG/100ML (0.2 mg/mL) infusion  0.375 mcg/kg/min Intravenous Continuous Christopher End, MD 8.8 mL/hr at 12/17/14 1223 0.375 mcg/kg/min at 12/17/14 1223  . mupirocin ointment (BACTROBAN) 2 % 1 application  1 application Nasal BID Jolaine Artist, MD   1 application at [id number] 1123  . norepinephrine (LEVOPHED) 16 mg in dextrose 5 % 250 mL (0.064 mg/mL) infusion  0-40 mcg/min Intravenous Titrated Jolaine Artist, MD 9.4 mL/hr at 12/17/14 0800 10 mcg/min at 12/17/14 0800  . ondansetron (ZOFRAN) injection 4 mg  4 mg Intravenous Q6H PRN Shirley Friar, PA-C   4 mg at  12/14/14 1326  . pneumococcal 23 valent vaccine (PNU-IMMUNE) injection 0.5 mL  0.5 mL Intramuscular Tomorrow-1000 Jolaine Artist, MD   0.5 mL at 12/15/14 1000  . potassium chloride (K-DUR) CR tablet 20 mEq  20 mEq Oral BID Jolaine Artist, MD   20 mEq at 12/17/14 1123  . sodium chloride 0.9 % injection 10-40 mL  10-40 mL Intracatheter Q12H Jolaine Artist, MD   10 mL at 12/16/14 2159  . sodium chloride 0.9 % injection 10-40 mL  10-40 mL Intracatheter PRN Jolaine Artist, MD      . sodium chloride 0.9 % injection 3 mL  3 mL Intravenous Q12H Shirley Friar, PA-C   3 mL at 12/15/14 2127  . sodium chloride 0.9 % injection 3 mL  3 mL Intravenous PRN Satira Mccallum Tillery, PA-C      . sodium chloride 0.9 % injection 3 mL  3 mL Intravenous Q12H Shaune Pascal [name], MD      . sodium chloride 0.9 % injection 3 mL  3 mL Intravenous PRN Shaune Pascal [name], MD      . sodium chloride 0.9 % injection 3 mL  3 mL Intravenous Q12H Shaune Pascal [name], MD      . sodium chloride 0.9 % injection 3 mL  3 mL Intravenous PRN Jolaine Artist, MD        Prescriptions prior to admission  Medication Sig Dispense Refill Last Dose  . allopurinol (ZYLOPRIM) 100 MG tablet Take 100 mg by mouth daily.  0 12/14/2014 at Unknown time  . apixaban (ELIQUIS) 5 MG TABS tablet Take 1 tablet (5 mg total) by mouth 2 (two) times daily. 60 tablet 3 12/14/2014 at Unknown time  . carvedilol (COREG) 25 MG tablet Take 1 tablet (25 mg total)  by mouth 2 (two) times daily with a meal. 60 tablet 1 12/14/2014 at 0930  . furosemide (LASIX) 40 MG tablet Take 1 tablet (40 mg total) by mouth 2 (two) times daily. 60 tablet 9 12/14/2014 at Unknown time  . spironolactone (ALDACTONE) 25 MG tablet Take 1 tablet (25 mg total) by mouth daily. 30 tablet 5 12/14/2014 at Unknown time    Family History  Problem Relation Age of Onset  . Heart failure Brother      Review of Systems:       Cardiac Review of Systems: Y or N  Chest Pain [   n  ]  Resting SOB [ y  ] Exertional SOB  Blue.Reese  ]  Orthopnea y[ y ]   Pedal Edema [ y  ]    Palpitations Blue.Reese  ] Syncope  [ n ]   Presyncope [  n ]  General Review of Systems: [Y] = yes [  ]=no Constitional: recent weight change Blue.Reese  ]; anorexia Blue.Reese  ]; fatigue [  y]; nausea [ y ]; night sweats [ n ]; fever [  ]; or chills [n  ]                                                               Dental: poor dentition[ y-broken left lower molars ]; Last Dentist visit: 1 tear  Eye : blurred vision [  ]; diplopia [   ]; vision changes [  ];  Amaurosis fugax[  ]; Resp: cough [  ];  wheezing[  ];  hemoptysis[  ]; shortness of breath[  ]; paroxysmal nocturnal dyspnea[  ]; dyspnea on exertion[ y ]; or orthopnea[y  ];  GI:  gallstones[  ], vomiting[  ];  dysphagia[  ]; melena[  ];  hematochezia [  ]; heartburn[  ];   Hx of  Colonoscopy[  ]; GU: kidney stones [  ]; hematuria[  ];   dysuria [  ];  nocturia[  ];  history of     obstruction [  ]; urinary frequency [  ]             Skin: rash, swelling[  ];, hair loss[  ];  peripheral edema[  ];  or itching[  ]; Musculosketetal: myalgias[  ];  joint swelling[  ];  joint erythema[  ];  joint pain[  ];  back pain[  ];  Heme/Lymph: bruising[  ];  bleeding[n  ];  anemia[  ];  Neuro: TIA[  ];  headaches[  ];  stroke[  ];  vertigo[  ];  seizures[  ];   paresthesias[  ];  difficulty walking[  ];  Psych:depression[  ]; anxiety[  ];  Endocrine: diabetes[Y  ];  thyroid dysfunction[  ];  Immunizations: Flu [  ]; Pneumococcal[  ];  Other: right-hand dominant. No previous history of thoracic trauma   Physical Exam: BP 131/88 mmHg  Pulse 90  Temp(Src) 98.4 F (36.9 C) (Oral)  Resp 25  Ht 5\' 7"  (1.702 m)  Wt 171 lb 15.3 oz (78 kg)  BMI 26.93 kg/m2  SpO2 96%      Physical Exam  General: Middle-aged AA  Male in the CCU on balloon pump responsive in no acute distress  HEENT: Normocephalic pupils  equal , dentition suboptimal  Neck: Supple without JVD, adenopathy, or  bruit Chest: Clear to auscultation, symmetrical breath sounds, no rhonchi, no tenderness             or deformity Cardiovascular: Irregular  rhythm, III/VI   holosystolic murmur, +s3 gallop, peripheral pulses             palpable in all extremities Abdomen:  Soft, nontender, no palpable mass or organomegaly Extremities: Warm, well-perfused, no clubbing cyanosis edema or tenderness,              no venous stasis changes of the legs Rectal/GU: Deferred Neuro: Grossly non--focal and symmetrical throughout Skin: Clean and dry without rash or ulceration    Diagnostic Studies & Laboratory data:     Recent Radiology Findings:   Dg Chest Port 1 View  12/16/2014   ADDENDUM REPORT: 12/16/2014 15:50  ADDENDUM: Right PICC line has been placed with the tip in the SVC.   Electronically Signed   By: Rolm Baptise M.D.   On: 12/16/2014 15:50   12/16/2014   CLINICAL DATA:  Confirm line placement  EXAM: PORTABLE CHEST - 1 VIEW  COMPARISON:  12/16/2014  FINDINGS: Left AICD and Swan-Ganz catheter remain in stable position. Aortic balloon pump again noted with the tip projecting over the proximal descending thoracic aorta. There is cardiomegaly. No confluent airspace opacities, effusions or edema.  IMPRESSION: Support devices in stable position as above.  Cardiomegaly.  Electronically Signed: By: Rolm Baptise M.D. On: 12/16/2014 15:17   Dg Chest Port 1 View  12/16/2014   CLINICAL DATA:  Central line placement  EXAM: PORTABLE CHEST - 1 VIEW  COMPARISON:  Portable exam at 1333 hr compared to 0509 hours  FINDINGS: LEFT subclavian AICD with lead tip projecting RIGHT ventricle.  RIGHT jugular Swan-Ganz catheter with tip projecting over proximal RIGHT pulmonary artery.  Intra-aortic balloon pump has been partially withdrawn with tip now projecting over the inferior aspect of the aortic arch.  Enlargement of cardiac silhouette with slight vascular congestion.  Lungs clear, with decrease in perihilar edema seen on previous  exam.  No pleural effusion or pneumothorax.  IMPRESSION: Position of support devices as above.  Enlargement of cardiac silhouette with improved edema.   Electronically Signed   By: Lavonia Dana M.D.   On: 12/16/2014 13:48   Dg Chest Port 1 View  12/16/2014   CLINICAL DATA:  CHF, chronic systolic heart failure, atrial fibrillation, valvular heart disease  EXAM: PORTABLE CHEST - 1 VIEW  COMPARISON:  Portable exam 0509 hours compared 12/15/2014  FINDINGS: Swan-Ganz catheter is been partially withdrawn, tip projecting over RIGHT pulmonary artery.  LEFT subclavian AICD lead tip projects over RIGHT ventricle.  Tip of intra-aortic balloon pump projects over aortic arch unchanged, consider withdrawal 3 cm to place tip below aortic arch.  Enlargement of cardiac silhouette with pulmonary vascular congestion.  Probable mild perihilar edema.  No segmental consolidation, pleural effusion or pneumothorax.  IMPRESSION: Enlargement of cardiac silhouette with vascular congestion and question minimal pulmonary edema.  Tip of intra-aortic balloon pump projects over aortic arch, consider withdrawal 3 cm to place below aortic arch.  Findings called to [facility] on [facility] on 12/16/2014 at 0924 hr.   Electronically Signed   By: Lavonia Dana M.D.   On: 12/16/2014 09:24   Dg Chest Port 1 View  12/15/2014   CLINICAL DATA:  Central line placement, history atrial fibrillation, chronic systolic heart failure, valvular heart disease, hypertension  EXAM:  PORTABLE CHEST - 1 VIEW  COMPARISON:  Portable exam 1934 hours compared to 11/09/2014  FINDINGS: RIGHT jugular Swan-Ganz catheter with tip projecting over RIGHT lower lobe pulmonary artery ; this appears more peripheral than typically identified.  LEFT subclavian AICD lead tip projects over RIGHT ventricle.  Tip of intra-aortic balloon pump projects over aortic arch.  Enlargement of cardiac silhouette with pulmonary vascular congestion.  No gross pulmonary infiltrate, pleural effusion, or  pneumothorax.  Osseous structures unremarkable.  IMPRESSION: Tip of RIGHT jugular Swan-Ganz catheter projects over RIGHT lower lobe pulmonary artery, more peripheral than typically visualized; recommend confirmation of position with wedge pressure assessment.  Tip of intra-aortic balloon pump projects over aortic arch; recommend withdrawal 2 cm to place tip below aortic arch.  Enlargement of cardiac silhouette post AICD.  Findings called to [facility] on [facility] on 12/15/2014 at 1940 hours.   Electronically Signed   By: Lavonia Dana M.D.   On: 12/15/2014 19:41     I have independently reviewed the above radiologic studies.  Recent Lab Findings: Lab Results  Component Value Date   WBC 10.1 12/17/2014   HGB 14.0 12/17/2014   HCT 43.5 12/17/2014   PLT 190 12/17/2014   GLUCOSE 269* 12/17/2014   CHOL 111 12/16/2014   TRIG 65 12/16/2014   HDL 36* 12/16/2014   LDLCALC 62 12/16/2014   ALT 36 12/14/2014   AST 26 12/14/2014   NA 128* 12/17/2014   K 4.0 12/17/2014   CL 94* 12/17/2014   CREATININE 1.48* 12/17/2014   BUN 24* 12/17/2014   CO2 24 12/17/2014   TSH 6.312* 12/14/2014   INR 2.23* 12/14/2014   HGBA1C 7.2* 12/16/2014      Assessment / Plan:     44 year old diabetic male with nonischemic cardiomyopathy and several year history of low ejection fraction, compensated.  Over the past few weeks the patient has had rapidly progressive symptoms of uncompensated heart failure. He has been admitted twice to the hospital in the last month. On the most recent admission he was found to be in cardiogenic shock and now has a balloon pump and requires inotropic support. His renal function and hepatic function has stabilized. He is a nonsmoker and chest CT scan shows no significant thoracic or pulmonary pathology. He has tolerated oral anticoagulation in the past without history of GI bleeding. His had no previous history of malignancy. Ideally the patient would've been evaluated by transplant center for  consideration of cardiac transplantation. However at this point with severe cardiac failure requiring intraaortic  balloon pump we are in the process of evaluating the patient for destination therapy implantable left ventricle assist device. The patient is willing to undergo the procedure. He understands why he would need a left ventricle assist device. He understands that he is not on a heart transplant waiting list because he has not yet been evaluated by cardiac transplant center. He understands that he could potentially become a cardiac transplant candidate.      @ME1 @ 12/17/2014 12:59 PM

## 2014-12-17 NOTE — Progress Notes (Signed)
Advanced Heart Failure Rounding Note   Subjective:    [name] is a 44 y.o. 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  Admitted 8/26 with cardiogenic shock. PCWP 35 and co-ox 20%. Started on milrinone + levo. IABP placed.  8/28Luiz Blare removed. PICC placed. Remains on levo 10 mcg + milrinone 0.375 mcg.  Changed to po lasix.    CVP 4   Co-ox 58%      Objective:   Weight Range:  Vital Signs:   Temp:  [98.1 F (36.7 C)-99.5 F (37.5 C)] 98.1 F (36.7 C) (08/29 0400) Pulse Rate:  [27-177] 71 (08/29 0600) Resp:  [16-27] 18 (08/29 0600) BP: (98-161)/(60-101) 122/101 mmHg (08/29 0600) SpO2:  [94 %-100 %] 100 % (08/29 0600) Weight:  [171 lb 15.3 oz (78 kg)] 171 lb 15.3 oz (78 kg) (08/29 0500)    Weight change: Filed Weights   12/15/14 0337 12/16/14 0500 12/17/14 0500  Weight: 175 lb 0.7 oz (79.4 kg) 171 lb 1.2 oz (77.6 kg) 171 lb 15.3 oz (78 kg)    Intake/Output:   Intake/Output Summary (Last 24 hours) at 12/17/14 0700 Last data filed at 12/17/14 0700  Gross per 24 hour  Intake 2505.18 ml  Output   3200 ml  Net -694.82 ml     Physical Exam: General: Lying in bed  HEENT: Normal.  Neck:  JVP flat. No carotid bruits or masses noted.  Cardiac: Irregular irregular rate and rhythm. III/VI systolic murmur at the apex noted. + s3. Respiratory: Lungs are clear to auscultation bilaterally with normal work of breathing.  GI: Soft and nontender. Belly distended Extremities: RFem IABP. Warm trace edema. DPs + bilaterally  RUE PICC Skin: Warm and dry. Color is normal.  Neuro: Strength and sensation are intact and no gross focal deficits noted.  Psych: Alert, appropriate and with normal affect.  Telemetry: AF 90s Labs: Basic Metabolic Panel:  Recent Labs Lab 12/14/14 1359 12/15/14 0300 12/15/14 0652 12/16/14 0440 12/16/14 1827 12/16/14 2055 12/17/14 0515  NA 136  134*  --  135  --  133* 128*  K 5.2* 4.0  --  3.9  --  4.3 4.0  CL 101 99*  --  101  --  100* 94*  CO2 25 27  --  27  --  24 24  GLUCOSE 104* 146*  --  131*  --  113* 269*  BUN 43* 43*  --  31*  --  29* 24*  CREATININE 2.41* 2.36*  --  1.88*  --  1.80* 1.48*  CALCIUM 9.1 9.0  --  8.8*  --  8.9 8.4*  MG  --   --  2.0  --  2.1  --  1.8    Liver Function Tests:  Recent Labs Lab 12/10/14 1214 12/14/14 1203 12/14/14 1359  AST 20 25 26   ALT 25 36 36  ALKPHOS 82 89 88  BILITOT 1.1 0.9 1.1  PROT 7.5 8.3* 7.7  ALBUMIN 3.8 3.8 3.5    Recent Labs Lab 12/10/14 1214  LIPASE 16.0  AMYLASE 41   No results for input(s): AMMONIA in the last 168 hours.  CBC:  Recent Labs Lab 12/14/14 1203 12/14/14 1359 12/15/14 0300 12/16/14 0440 12/17/14 0515  WBC 6.4 6.1 6.4 7.2 10.1  NEUTROABS  --  2.9  --   --   --   HGB 13.8 13.8  12.8* 14.5 14.0  HCT 43.3 42.0 39.3 44.3 43.5  MCV 68.5* 67.4* 66.3* 66.8* 65.9*  PLT 232 235 271 199 190    Cardiac Enzymes: No results for input(s): CKTOTAL, CKMB, CKMBINDEX, TROPONINI in the last 168 hours.  BNP: BNP (last 3 results)  Recent Labs  11/30/14 1325 12/14/14 1203 12/14/14 1359  BNP 857.9* 1341.9* 1473.6*    ProBNP (last 3 results)  Recent Labs  12/10/14 1214  PROBNP 1807.0*      Other results:  Imaging: Dg Chest Port 1 View  12/16/2014   ADDENDUM REPORT: 12/16/2014 15:50  ADDENDUM: Right PICC line has been placed with the tip in the SVC.   Electronically Signed   By: Rolm Baptise M.D.   On: 12/16/2014 15:50   12/16/2014   CLINICAL DATA:  Confirm line placement  EXAM: PORTABLE CHEST - 1 VIEW  COMPARISON:  12/16/2014  FINDINGS: Left AICD and Swan-Ganz catheter remain in stable position. Aortic balloon pump again noted with the tip projecting over the proximal descending thoracic aorta. There is cardiomegaly. No confluent airspace opacities, effusions or edema.  IMPRESSION: Support devices in stable position as above.   Cardiomegaly.  Electronically Signed: By: Rolm Baptise M.D. On: 12/16/2014 15:17   Dg Chest Port 1 View  12/16/2014   CLINICAL DATA:  Central line placement  EXAM: PORTABLE CHEST - 1 VIEW  COMPARISON:  Portable exam at 1333 hr compared to 0509 hours  FINDINGS: LEFT subclavian AICD with lead tip projecting RIGHT ventricle.  RIGHT jugular Swan-Ganz catheter with tip projecting over proximal RIGHT pulmonary artery.  Intra-aortic balloon pump has been partially withdrawn with tip now projecting over the inferior aspect of the aortic arch.  Enlargement of cardiac silhouette with slight vascular congestion.  Lungs clear, with decrease in perihilar edema seen on previous exam.  No pleural effusion or pneumothorax.  IMPRESSION: Position of support devices as above.  Enlargement of cardiac silhouette with improved edema.   Electronically Signed   By: Lavonia Dana M.D.   On: 12/16/2014 13:48   Dg Chest Port 1 View  12/16/2014   CLINICAL DATA:  CHF, chronic systolic heart failure, atrial fibrillation, valvular heart disease  EXAM: PORTABLE CHEST - 1 VIEW  COMPARISON:  Portable exam 0509 hours compared 12/15/2014  FINDINGS: Swan-Ganz catheter is been partially withdrawn, tip projecting over RIGHT pulmonary artery.  LEFT subclavian AICD lead tip projects over RIGHT ventricle.  Tip of intra-aortic balloon pump projects over aortic arch unchanged, consider withdrawal 3 cm to place tip below aortic arch.  Enlargement of cardiac silhouette with pulmonary vascular congestion.  Probable mild perihilar edema.  No segmental consolidation, pleural effusion or pneumothorax.  IMPRESSION: Enlargement of cardiac silhouette with vascular congestion and question minimal pulmonary edema.  Tip of intra-aortic balloon pump projects over aortic arch, consider withdrawal 3 cm to place below aortic arch.  Findings called to [facility] on [facility] on 12/16/2014 at 0924 hr.   Electronically Signed   By: Lavonia Dana M.D.   On: 12/16/2014 09:24   Dg  Chest Port 1 View  12/15/2014   CLINICAL DATA:  Central line placement, history atrial fibrillation, chronic systolic heart failure, valvular heart disease, hypertension  EXAM: PORTABLE CHEST - 1 VIEW  COMPARISON:  Portable exam 1934 hours compared to 11/09/2014  FINDINGS: RIGHT jugular Swan-Ganz catheter with tip projecting over RIGHT lower lobe pulmonary artery ; this appears more peripheral than typically identified.  LEFT subclavian AICD lead tip projects over RIGHT ventricle.  Tip of intra-aortic balloon pump projects over aortic arch.  Enlargement of cardiac silhouette with pulmonary vascular congestion.  No gross pulmonary infiltrate, pleural effusion, or pneumothorax.  Osseous structures unremarkable.  IMPRESSION: Tip of RIGHT jugular Swan-Ganz catheter projects over RIGHT lower lobe pulmonary artery, more peripheral than typically visualized; recommend confirmation of position with wedge pressure assessment.  Tip of intra-aortic balloon pump projects over aortic arch; recommend withdrawal 2 cm to place tip below aortic arch.  Enlargement of cardiac silhouette post AICD.  Findings called to [facility] on [facility] on 12/15/2014 at 1940 hours.   Electronically Signed   By: Lavonia Dana M.D.   On: 12/15/2014 19:41     Medications:     Scheduled Medications: . Chlorhexidine Gluconate Cloth  6 each Topical Daily  . furosemide  40 mg Oral BID  . mupirocin ointment  1 application Nasal BID  . pneumococcal 23 valent vaccine  0.5 mL Intramuscular Tomorrow-1000  . potassium chloride  20 mEq Oral BID  . sodium chloride  10-40 mL Intracatheter Q12H  . sodium chloride  3 mL Intravenous Q12H  . sodium chloride  3 mL Intravenous Q12H  . sodium chloride  3 mL Intravenous Q12H    Infusions: . amiodarone 30 mg/hr (12/17/14 0029)  . heparin 1,000 Units/hr (12/17/14 [id number])  . milrinone 0.375 mcg/kg/min (12/17/14 0028)  . norepinephrine (LEVOPHED) Adult infusion 10 mcg/min (12/16/14 2157)    PRN  Medications: sodium chloride, sodium chloride, sodium chloride, acetaminophen, diphenhydrAMINE, fentaNYL, midazolam, ondansetron (ZOFRAN) IV, sodium chloride, sodium chloride, sodium chloride, sodium chloride   Assessment:   1. Cardiogenic shock 2. Acute/chonic systolic HF      -- NICM EF 20-25% 3. Chronic AF on Eliquis 4. Severe MR 5. Acute on chronic renal failure, stage III-IV (creatinine 1.4 -> 2.0) 6. Blood Type O+      Plan/Discussion:    Continue IABP + dual inotropes --> milrinone 0.375 mcg + levo 35mcg. CO-OX 58% . Continue current regimen. Renal function continues to trend down. Today creatinine is 1.48. Volume status stable. Continue po lasix.   Remains on heparin for IABP and chronic AF.   Will need VAD this week if family support and insurance in place. Will discuss at [facility] today. Dr. Prescott Gum and VAD team have seen.   Length of Stay: 3  CLEGG,AMY NP-C  12/17/2014, 7:00 AM  Advanced Heart Failure Team Pager [phone] (M-F; 7a - 4p)  Please contact [facility] for night-coverage after hours (4p -7a ) and weekends on [email or web address]  Patient seen with NP, agree with the above note. His hemodynamics are stable but tenuous with co-ox 58% on milrinone + norepinephrine + IABP.  Creatinine improved.  He is on Lasix po with CVP 4.  IABP augmenting appropriately.    Remains in atrial fibrillation, rate-controlled on amiodarone.  DCCV would be unlikely to hold and unlikely to improve hemodynamics enough to avoid need for LVAD.   Will discuss in [facility] today, likely LVAD later this week as bridge to transplant.   The patient is critically ill with multiple organ systems failure and requires high complexity decision making for assessment and support, frequent evaluation and titration of therapies, application of advanced monitoring technologies and extensive interpretation of multiple databases.   Critical Care Time devoted to patient care services described in this note is 35  Minutes.  Loralie Champagne 12/17/2014 8:48 AM

## 2014-12-17 NOTE — Progress Notes (Signed)
Initial Nutrition Assessment  DOCUMENTATION CODES:   Not applicable  INTERVENTION:   -Continue with Heart Healthy Diet  NUTRITION DIAGNOSIS:   Unintentional weight loss related to  (diuresis) as evidenced by percent weight loss.  GOAL:   Patient will meet greater than or equal to 90% of their needs  MONITOR:   PO intake, Labs, Weight trends, Skin, I & O's  REASON FOR ASSESSMENT:   Consult  (LVAD evaluation)  ASSESSMENT:   [name] is a 44 y.o. 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  Pt admitted with acute CHF. RD consulted for LVAD evaluation. Per MD notes, potential for LVAD later this week is family support and insurance are in place.   Attempted to examine pt x 2, however, pt was either in procedure on in with MD at times of visit. Unable to complete Nutrition-Focused physical exam at this time, however, RD did not observe any fat or muscle depletion in the temple, orbital, or scapular areas.   Pt has a good appetite and consuming meal well on a heart Healthy diet (PO: 80-100%).   Reviewed wt hx. UBW fluctuates at baseline; wt has ranged between 171-185# for greater than 1 year. Noted a 7.5% (14#) wt loss within the past month. RD suspects wt loss is due to diuretic use; pt was transitioned from IV to PO lasix on 12/16/14.   Labs reviewed: Na: 128.   Diet Order:  Diet Heart Room service appropriate?: Yes; Fluid consistency:: Thin  Skin:  Reviewed, no issues  Last BM:  PTA  Height:   Ht Readings from Last 1 Encounters:  12/14/14 5\' 7"  (1.702 m)    Weight:   Wt Readings from Last 1 Encounters:  12/17/14 171 lb 15.3 oz (78 kg)    Ideal Body Weight:  67.3 kg  BMI:  Body mass index is 26.93 kg/(m^2).  Estimated Nutritional Needs:   Kcal:  1800-2000  Protein:  90-100 grams  Fluid:  1.8-2.0 L  EDUCATION NEEDS:   No education needs identified at  this time  Cesar Alf A. Jimmye Norman, RD, LDN, CDE Pager: [phone] After hours Pager: [phone]

## 2014-12-17 NOTE — Progress Notes (Signed)
VASCULAR LAB PRELIMINARY  PRELIMINARY  PRELIMINARY  PRELIMINARY  Bilateral lower extremity venous duplex completed.    Preliminary report:  There is no DVT or SVT noted in the bilateral lower extremities.   Ewin Rehberg, RVT 12/17/2014, 12:10 PM

## 2014-12-17 NOTE — Progress Notes (Signed)
Pre-op Cardiac Surgery  Carotid Findings:  No significant plaque noted.  Vertebral artery flow is antegrade. No caroitd measurements secondary to balloon pump.  Upper Extremity Right Left  Brachial Pressures Restricted  131  Radial Waveforms    Ulnar Waveforms    Palmar Arch (Allen's Test)     Findings:      Lower  Extremity Right Left  Dorsalis Pedis 111 132  Anterior Tibial    Posterior Tibial 152 132  Ankle/Brachial Indices <1 1.0    Findings:  Pressures are WNL. Unable to determine waveforms secondary to balloon pump.

## 2014-12-17 NOTE — Progress Notes (Signed)
  Echocardiogram 2D Echocardiogram has been performed.  Darlina Sicilian M 12/17/2014, 1:51 PM

## 2014-12-17 NOTE — Progress Notes (Signed)
ANTICOAGULATION CONSULT NOTE - Follow Up Consult  Pharmacy Consult for heparin Indication: IABP  No Known Allergies  Patient Measurements: Height: 5\' 7"  (170.2 cm) Weight: 171 lb 15.3 oz (78 kg) IBW/kg (Calculated) : 66.1  Vital Signs: Temp: 98.4 F (36.9 C) (08/29 1200) Temp Source: Oral (08/29 1200) BP: 131/88 mmHg (08/29 1200) Pulse Rate: 90 (08/29 1200)  Labs:  Recent Labs  12/15/14 0300  12/16/14 0440 12/16/14 0445  12/16/14 1826 12/16/14 2055 12/17/14 0515 12/17/14 1230 12/17/14 1237  HGB 12.8*  --  14.5  --   --   --   --  14.0  --   --   HCT 39.3  --  44.3  --   --   --   --  43.5  --   --   PLT 271  --  199  --   --   --   --  190  --   --   APTT 43*  < > 51*  --   < > 72*  --  89*  --  68*  HEPARINUNFRC >2.20*  --   --  0.81*  --   --   --  0.67 0.46  --   CREATININE 2.36*  --  1.88*  --   --   --  1.80* 1.48*  --   --   < > = values in this interval not displayed.  Estimated Creatinine Clearance: 58.3 mL/min (by C-G formula based on Cr of 1.48).  Assessment: 44 yo m with longstanding cardiomyopathy. Patient is on Eliquis at home for previous DVT and afib. Pharmacy is consulted to dose heparin while inpatient. Patient has IABP s/p cath and pharmacy is using aPTTs to monitor heparin as Eliquis may still be affecting heparin levels (HL 0.46 this AM). aPTT and heparin level appear now to be correlating soon.  The aPTT is now therapeutic this afternoon at 68 on 1000 units/hr. CBC stable, no issues per RN.   There is discussion of possible LVAD this week.  Goal of Therapy:  aPTT 60-85 seconds; heparin level 0.2-0.5 Monitor platelets by anticoagulation protocol: Yes   Plan:  Continue heparin infusion at 1000 units/hr Will follow heparin levels alone for anticoagulation adjusting  Erin Hearing PharmD., BCPS Clinical Pharmacist Pager [phone] 12/17/2014 1:22 PM

## 2014-12-17 NOTE — Progress Notes (Signed)
ANTICOAGULATION CONSULT NOTE - Follow Up Consult  Pharmacy Consult for heparin Indication: IABP  No Known Allergies  Patient Measurements: Height: 5\' 7"  (170.2 cm) Weight: 171 lb 15.3 oz (78 kg) IBW/kg (Calculated) : 66.1  Vital Signs: Temp: 98.1 F (36.7 C) (08/29 0400) Temp Source: Oral (08/29 0400) BP: 122/101 mmHg (08/29 0600) Pulse Rate: 71 (08/29 0600)  Labs:  Recent Labs  12/14/14 1203  12/15/14 0300  12/16/14 0440 12/16/14 0445 12/16/14 1228 12/16/14 1826 12/16/14 2055 12/17/14 0515  HGB 13.8  < > 12.8*  --  14.5  --   --   --   --  14.0  HCT 43.3  < > 39.3  --  44.3  --   --   --   --  43.5  PLT 232  < > 271  --  199  --   --   --   --  190  APTT  --   < > 43*  < > 51*  --  67* 72*  --  89*  LABPROT 24.5*  --   --   --   --   --   --   --   --   --   INR 2.23*  --   --   --   --   --   --   --   --   --   HEPARINUNFRC  --   < > >2.20*  --   --  0.81*  --   --   --  0.67  CREATININE 2.33*  < > 2.36*  --  1.88*  --   --   --  1.80* 1.48*  < > = values in this interval not displayed.  Estimated Creatinine Clearance: 58.3 mL/min (by C-G formula based on Cr of 1.48).  Assessment: 44 yo m with longstanding cardiomyopathy. Patient is on Eliquis at home for previous DVT and afib. Pharmacy is consulted to dose heparin while inpatient. Patient has IABP s/p cath and pharmacy is using aPTTs to monitor heparin as Eliquis may still be affecting heparin levels (HL 0.67 this AM). aPTT and heparin level should be correlating soon.  The aPTT is supratherapeutic today at 89 on 1100 units/hr. CBC stable, no issues per RN.   Goal of Therapy:  aPTT 60-85 seconds; heparin level 0.2-0.5 Monitor platelets by anticoagulation protocol: Yes   Plan:  Continue heparin infusion at 1000 units/hr F/u 6 hr aPTT and heparin level - should be correlating soon  Sherlon Handing, PharmD, BCPS Clinical pharmacist, pager [phone] 12/17/2014 6:14 AM

## 2014-12-18 ENCOUNTER — Inpatient Hospital Stay (HOSPITAL_COMMUNITY): Payer: 59

## 2014-12-18 LAB — CARBOXYHEMOGLOBIN
Carboxyhemoglobin: 1.3 % (ref 0.5–1.5)
METHEMOGLOBIN: 1.4 % (ref 0.0–1.5)
O2 Saturation: 61.8 %
TOTAL HEMOGLOBIN: 15.1 g/dL (ref 13.5–18.0)

## 2014-12-18 LAB — GLUCOSE, CAPILLARY
GLUCOSE-CAPILLARY: 97 mg/dL (ref 65–99)
Glucose-Capillary: 109 mg/dL — ABNORMAL HIGH (ref 65–99)

## 2014-12-18 LAB — BASIC METABOLIC PANEL
Anion gap: 10 (ref 5–15)
BUN: 21 mg/dL — ABNORMAL HIGH (ref 6–20)
CHLORIDE: 93 mmol/L — AB (ref 101–111)
CO2: 24 mmol/L (ref 22–32)
CREATININE: 1.4 mg/dL — AB (ref 0.61–1.24)
Calcium: 8.6 mg/dL — ABNORMAL LOW (ref 8.9–10.3)
GFR calc non Af Amer: 59 mL/min — ABNORMAL LOW (ref >60)
Glucose, Bld: 232 mg/dL — ABNORMAL HIGH (ref 65–99)
Potassium: 4.6 mmol/L (ref 3.5–5.1)
Sodium: 127 mmol/L — ABNORMAL LOW (ref 135–145)

## 2014-12-18 LAB — CBC
HEMATOCRIT: 43.2 % (ref 39.0–52.0)
HEMOGLOBIN: 14.4 g/dL (ref 13.0–17.0)
MCH: 22 pg — ABNORMAL LOW (ref 26.0–34.0)
MCHC: 33.3 g/dL (ref 30.0–36.0)
MCV: 65.9 fL — AB (ref 78.0–100.0)
Platelets: 169 10*3/uL (ref 150–400)
RBC: 6.56 MIL/uL — AB (ref 4.22–5.81)
RDW: 16.7 % — ABNORMAL HIGH (ref 11.5–15.5)
WBC: 9.6 10*3/uL (ref 4.0–10.5)

## 2014-12-18 LAB — HEPATIC FUNCTION PANEL
ALT: 24 U/L (ref 17–63)
AST: 21 U/L (ref 15–41)
Albumin: 2.9 g/dL — ABNORMAL LOW (ref 3.5–5.0)
Alkaline Phosphatase: 99 U/L (ref 38–126)
Bilirubin, Direct: 0.5 mg/dL (ref 0.1–0.5)
Indirect Bilirubin: 0.9 mg/dL (ref 0.3–0.9)
Total Bilirubin: 1.4 mg/dL — ABNORMAL HIGH (ref 0.3–1.2)
Total Protein: 7.4 g/dL (ref 6.5–8.1)

## 2014-12-18 LAB — HEPARIN LEVEL (UNFRACTIONATED): Heparin Unfractionated: 0.4 IU/mL (ref 0.30–0.70)

## 2014-12-18 MED ORDER — FENTANYL CITRATE (PF) 100 MCG/2ML IJ SOLN
25.0000 ug | INTRAMUSCULAR | Status: DC | PRN
  Administered 2014-12-17 – 2014-12-19 (×6): 50 ug via INTRAVENOUS
  Filled 2014-12-18 (×5): qty 2

## 2014-12-18 MED ORDER — INSULIN ASPART 100 UNIT/ML ~~LOC~~ SOLN
0.0000 [IU] | Freq: Three times a day (TID) | SUBCUTANEOUS | Status: DC
  Administered 2014-12-19 (×2): 3 [IU] via SUBCUTANEOUS

## 2014-12-18 MED ORDER — TRAMADOL HCL 50 MG PO TABS
50.0000 mg | ORAL_TABLET | Freq: Four times a day (QID) | ORAL | Status: DC | PRN
  Administered 2014-12-18: 50 mg via ORAL
  Filled 2014-12-18: qty 1

## 2014-12-18 MED ORDER — NOREPINEPHRINE BITARTRATE 1 MG/ML IV SOLN
7.5000 ug/kg/min | INTRAVENOUS | Status: DC
  Administered 2014-12-18 – 2014-12-20 (×2): 7.5 ug/kg/min via INTRAVENOUS
  Filled 2014-12-18 (×2): qty 16

## 2014-12-18 NOTE — Progress Notes (Signed)
[place] CLINICAL SOCIAL WORK DOCUMENTATION LVAD (Left Ventricular Assist Device) Psychosocial Screening Please remember that all information is confidential within the members of the VAD team and [facility]  12/17/2014 9:20 AM  Patient:  [name]  MRN:  [id number]  Account:  [id number]  Clinical Social Worker:  Ulla Gallo, LCSW Date/Time Initiated:   12/17/2014 09:20 AM Referral Source:    Cloyde Reams Reece,VAD Coordinator  Referral Reason:   VAD Implantation  Source of Information:   Patient, [name], [name] and chart review   PATIENT DEMOGRAPHICS NAME:   [name]     DOB:  01-21-1971  SS#:  [id number] Address:   [address]  [place] [address] Home Phone:  [phone]  Cell Phone:   [phone] Marital Status:   single     Primary Language:  ENGLISH  Faith:  OTHER Adherence with Medical Regimen:   compliant  Medication Adherence:   compliant  Physician appointment attendance:   compliant   Do you have a Living Will or Medical POA?  N Would you like to complete a Living Will and Medical POA prior to surgery?  Y Are you currently a DNR?  N Do you have a MOST form?  N Would you like to review one?  Y Do you have goals of care?     Have you had a consult with the palliative care team at [facility]?  N Comment:  Patient reports he has "never given it much thought". Patient wants to complete Living Will prior to surgery. Palliative Care consult to be completed later this afternoon.  Psychological Health Appearance:   Patient was well groomed lying in hospital bed.  Mental status:   alert and oriented  Eye Contact:   good  Thought Content:   Patient verbalized thoughts clearly  Speech:   clear  Mood:   Patient was appropriate under the circumstances  Affect:   positive  Insight:   realistic and appropriate  Judgment:   sound  Interaction Style:   realistic and appropriate    Family/Social Information Who lives in your home? Name9  lives alone      Do you have a plan for child care if relevant?   n/a  List family members outside the home (parents, friends, Theme park manager, etc..) Name2  [name]  [name]    Relationship to [place]  sister- lives in [place], [place]  sister- lives in [place], [place]  brother- lives in [place], [place]    Please list people who give you emotional support (family, parents, friends, pastor, etc..) Name3  [name]    Relationship to you3  friend- lives in [place]    Who is your primary and backup support pre and post-surgery? Explain the relationships i.e. strengths/weakness, etc.:   Patient's primary caregivers will be his two sisters [name] and [name]. They state they will remain with him during the first two weeks post hospitalization 24/7 and plan to return every weekend to support patient. Patient appears to have a very good relationship with his sisters.  Secondary Caregiver identified:   Patient reports his friend [name] will assist with back up caregiver and assist with transportation needs until patient able to return to driving.   Legal Do you currently have any legal issues/problems?   denies any issues  Durable POA or Legal Next of Kin:   Living situation Travel distance to [facility]:  15 minutes  Second Hand  Smoke Exposure:   no  Self- Care level:   Independent  Ambulation:   Independent  Transportation:   drives self  Limitations:   denies any limitations  Barriers impacting ability to participate in care:   none   Community Are you active with community agencies/resources?   none  Are you active in a church, Glass blower/designer, mosque, or other faith based community?   [employer]  What other sources do you have for spiritual support?   [place] you active in any clubs or social organizations?   none  What do you do for fun?  hobbies, interests?  travel   Education/Work information What is the last grade of school you completed?  Masters degree  Preferred method of learning? (Written, verbal, hands on):   combo  Do you have any problems with reading or writing?  no  Are you currently employed? If no, when were you last employed?   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   Patient hopes to return to work.  If yes, what type of employment do you hope to find?  Are you interested in job training or learning new skills?   no  Did you serve in the [employer]? If so, what branch?   no   Financial Information What is your source of income?    Employment/pending disability from employer  Do you have difficulty meeting your monthly expenses?   no  If yes, which ones?   How do you usually cope with this?    not an issue  Primary Health Insurance:    [employer]:    none  Have you applied for Medicaid?    no  Have you applied for Social Security Disability?    no  Do you have prescription coverage?    yes  What are your prescription co-pays?    vary  Are you required to use a certain pharmacy?    [facility] on [address]  Do you have a mail order option for your prescriptions?    no  If yes, what pharmacy do you use for mail order?   Have you ever refused medication due to cost?    no  Discuss monthly cost for dressing supplies post procedure $150-300    discussed  Can you budget for this monthly expense?    yes   Medical Information Briefly describe your medical history, surgeries and why you are here for evaluation.    Patient reports he was diagnosed with CHF in 1997. He stated "they thought I had the flu". He received a defibrillator in 1999. He reports he did  well for a long time with a few hospitalizations for heart failure until recently.  Are you able to complete your ADL's?    Independent  Do you have any history of emotional, medical, physical or verbal trauma?    no  Do you have any family history of heart problems?    Brother- MI  Father- "heart issues"  Sister- AFib  Do you smoke? If so, what is the amount and frequency?    no  Do you drink alcohol? If so, how many drinks a day/week?    no  Have you ever used illegal drugs or misused medications?    no  If yes, what drugs do you use and how often?   Have you ever been treated for substance abuse?    no  If yes, where and when were you treated?   Are you currently using illegal substances?    no   Mental Health History Have you ever had problems with depression, anxiety or other mental health issues?   no  If yes, have you seen a counselor, psychiatrist or therapist?   If you are currently experiencing problems are you interested in talking with a professional?    no  Would you be interested in participating in an LVAD support group?  Y LVAD support group for: Patient Caregivers patient Have you or are you taking any medications for anxiety/depression or any mental health concerns?    no  If yes, Please list the medications?   How have you been feeling in the past year?    "fine until the last 3-4 weeks"  How do you handle stressful situations?   "mostly stay away but I have my moments and cry"  What are your coping strategies? Please List:    prayer and crying  Have you had any past or current thoughts of suicide?    no  How many hours do you sleep at night?    6 hours  How is your appetite?   very well  PHQ2- Depression Screen:    2  PHQ9 Depression screen (only complete if the PHQ2 is positive):    Plan for VAD Implementation Do you know and understand what happens during VAD surgery?  Please describe your thoughts:   Patient verbalizes understanding of surgery   What do you know about the risks of any major surgery or use of general anesthesia?    Patient verbalizes understanding of risk factors of stroke, infection and death.  What do you know about the risks and side effects associated with VAD surgery?    Patient's understanding were accurate and appropriate.  Explain what will happen right after surgery?    Patient understands transfer to ICU and will be intubated. Patient will then be transferred to regular floor once stable enough.  Information obtained from:    Patient and sisters  What is your plan for transportation for the first 8 weeks post-surgery? (Patients are not recommended to drive post-surgery for 8 weeks)    Patient reports his sisters and has a number of friends/co-workers who can also assist with any transport needs.  Driver: [name]  Valid license:    yes  Working vehicle/last inspection:    2016 Nissan Versa  Airbags:  yes Do you plan to disarm the airbags- there is a risk of discharging the device if the airbag were to deploy.   discussed and patient will decide about disarming.  What do you know about your diet post-surgery?    heart healthy  How do you plan to monitor your medications, current and future?    self pour medication box  How do you plan to complete ADL's post-surgery (Shower, dress, etc.)?    Patient hopes to return to independent living but will ask his sisters for assistance until independent.  Will it be difficult to ask for help for your caregivers? If so, explain:    no  Please explain what you hope will be improved about your life as a result of receiving the VAD  Patient states that "it is still up in the air". Patient still absorbing all the information about LVAD in a very short time.  Please tell me your biggest concern or fear about receiving the VAD?    Patient stattes that the acutal surgery and recovery are his biggest concerns.  How do you cope with your  concerns/fears?    Prayer  Please explain your understanding of how your body will change? Are you worried about these changes?    Patient shared "it will take some getting use to"  Do you see any barriers to your surgery or follow up? If yes, please explain:   denies any barriers     Understanding of LVAD Surgical procedures and risks:    discussed and reviewed  Electrical need for LVAD:    confirmed 3 prong outlets and electrical needs for LVAD  Safety precautions with LVAD (Water, etc.):   Patient verbalizes  understanding of water safety and not to submerge in water.  Potential side effects with LVAD:    Discussed and reviewed  Types of Advanced heart failure therapies available:    Discussed and reviewed- additonal information provided by LVAD Coordinator  LVAD daily self-care (dressing changes, computer check, extra supplies):    Discussed and reviewed  Outpatient follow-up (follow-up in LVAD clinic; monitoring blood thinners)    Discussed and reviewed  Need for emergency planning:    Discussed and reviewed  Expectations for LVAD:    accurate and realistic  Current level of motivation to prepare for LVAD:    Patient appears to be motivated for LVAD implantation  Patient's perception of need for LVAD:    Patient verbalizes need  Present level of consent for LVAD:    Patient stated "don't have a choice"  Reasons for seeking LVAD:    "I need it"   Psychosocial Protective Factors  Mr. Kosin is single and has a loving and supportive family. His two sisters [name] and [name] will be primary caregivers and although they live in [place] plan to remain with patient 24/7 for the first two weeks and will visit every weekend. Patient is compliant with all his medical regimen and denies any concerns with follow up. Patient lives alone but only 15 minutes from [facility]. He is independent with ADLs, drives self and denies any barriers to care/recovery. He is active in his church  community who are very supportive. he enjoys travel as his hobby. 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 He has worked for [employer] for 19 years and has good insurance/income benefits. He also has a good support network through his co-workers. He denies any financial difficulties with monthly expenses. He denies any history of trauma, alcohol, tobacco or substance abuse. he states no history of depression or mental illness. He handles stress through prayer and support of family/friends. He scored a 2 on the Depression screen although states situational due to current medical crisis. Patient reports he has a large support network to assist with any transport needs that may arise during recovery. Psychosocial Risk Factors  Patient is in need of Advanced Directives and HPOA as he has never completed.  Clinical Intervention: Patient was seen by Palliative Care and will refer to Spiritual Care for completion of Advance Directives.   Educated patient/family on the following Caregiver(s) role responsibilities:   Discussed and reviewed. Sisters verbalize understanding of role and commit to 24/7 for the first two weeks post hospitalization.  Financial planning for LVAD:    Discussed potential needs and explored options for possible future needs  Role of Clinical Social Worker:    Discussed and reviewed  Signs of depression and anxiety:    Discussed and reviewed  Support planning for LVAD:    Discussed and reviewed- most information provided by the LVAD Coordinator  LVAD process:    Discussed and reviewed  Caregiver contract/agreement:   Discussed and reviewed- agreement provided by the LVAD Coordinator  Discussed Referral(s) to:    Spiritual Care for Advanced Directives  LVAD Support Group  Community Resources:    None at this time- CSW will monitor needs.  Clinical Impression Recommendations:    Mr. Thielen is an excellent candidate for LVAD  implantation. He states good adherence to medical regimen and has a very supportive family. He has good understanding of the risks and benefits of the LVAD implantation. He has no history of alcohol, tobacco or substance abuse. He denies any mental health issues and will seek support through prayer, church community and LVAD team if needed. He has a positive outlook on life and hopeful to return to work and previous level of functioning. CSW will continue to follow for support and any other psychosocial needs that may arise. Raquel Sarna, [place]

## 2014-12-18 NOTE — Progress Notes (Signed)
Inpatient Diabetes Program Recommendations  AACE/ADA: New Consensus Statement on Inpatient Glycemic Control (2013)  Target Ranges:  Prepandial:   less than 140 mg/dL      Peak postprandial:   less than 180 mg/dL (1-2 hours)      Critically ill patients:  140 - 180 mg/dL   Review of Glycemic Control:  Results for HEBER, BRADFIELD (MRN [id number]) as of 12/18/2014 10:01  Ref. Range 12/17/2014 05:15 12/18/2014 05:25  Glucose Latest Ref Range: 65-99 mg/dL 269 (H) 232 (H)  Results for OLAMIPOSI, TOPP (MRN [id number]) as of 12/18/2014 10:01  Ref. Range 12/16/2014 04:45  Hemoglobin A1C Latest Ref Range: 4.8-5.6 % 7.2 (H)   Diabetes history: None  Note that patient has no history of diabetes.  A1C is elevated and Glucose this morning was 232 mg/dL.  Patient for VAD on Thursday 12/20/14.  Please consider checking CBG's q 4 hours with moderate correction.    Thanks, Adah Perl, RN, BC-ADM Inpatient Diabetes Coordinator Pager [phone] (8a-5p)

## 2014-12-18 NOTE — Progress Notes (Signed)
ANTICOAGULATION CONSULT NOTE - Follow Up Consult  Pharmacy Consult for heparin Indication: IABP  No Known Allergies  Patient Measurements: Height: 5\' 7"  (170.2 cm) Weight: 170 lb 10.2 oz (77.4 kg) IBW/kg (Calculated) : 66.1  Vital Signs: Temp: 98.6 F (37 C) (08/30 0400) Temp Source: Oral (08/30 0400) BP: 129/89 mmHg (08/30 0700) Pulse Rate: 101 (08/30 0700)  Labs:  Recent Labs  12/16/14 0440  12/16/14 1826 12/16/14 2055 12/17/14 0515 12/17/14 1230 12/17/14 1237 12/18/14 0525  HGB 14.5  --   --   --  14.0  --   --  14.4  HCT 44.3  --   --   --  43.5  --   --  43.2  PLT 199  --   --   --  190  --   --  169  APTT 51*  < > 72*  --  89*  --  68*  --   HEPARINUNFRC  --   < >  --   --  0.67 0.46  --  0.40  CREATININE 1.88*  --   --  1.80* 1.48*  --   --  1.40*  < > = values in this interval not displayed.  Estimated Creatinine Clearance: 61.6 mL/min (by C-G formula based on Cr of 1.4).  Assessment: 44 yo m with longstanding cardiomyopathy. Patient is on Eliquis at home for previous DVT and afib. Pharmacy is consulted to dose heparin while inpatient. Patient has IABP s/p cath.  Heparin continues to be at goal with am HL of 0.4 on 1000 units/hr.  Possible LVAD this thursday.  Goal of Therapy:  aPTT 60-85 seconds; heparin level 0.2-0.5 Monitor platelets by anticoagulation protocol: Yes   Plan:  Continue heparin infusion at 1000 units/hr Will follow heparin levels alone for anticoagulation adjusting Follow up plan for surgery later this week  Erin Hearing PharmD., BCPS Clinical Pharmacist Pager [phone] 12/18/2014 7:16 AM

## 2014-12-18 NOTE — Progress Notes (Signed)
Advanced Heart Failure Rounding Note   Subjective:    [name] is a 44 y.o. 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  Admitted 8/26 with cardiogenic shock. PCWP 35 and co-ox 20%. Started on milrinone + levo. IABP placed.  8/28Luiz Blare removed. PICC placed.  Remains on levo 10 mcg + milrinone 0.375 mcg.  Feeling ok. Denies SOB/Orthopnea.   CVP 4  Creatinine 1.40  Co-ox 62 %      Objective:   Weight Range:  Vital Signs:   Temp:  [98 F (36.7 C)-98.6 F (37 C)] 98.6 F (37 C) (08/30 0400) Pulse Rate:  [61-138] 93 (08/30 0600) Resp:  [15-28] 15 (08/30 0600) BP: (121-156)/(83-109) 152/106 mmHg (08/30 0600) SpO2:  [96 %-100 %] 100 % (08/30 0600) Weight:  [170 lb 10.2 oz (77.4 kg)] 170 lb 10.2 oz (77.4 kg) (08/30 0500)    Weight change: Filed Weights   12/16/14 0500 12/17/14 0500 12/18/14 0500  Weight: 171 lb 1.2 oz (77.6 kg) 171 lb 15.3 oz (78 kg) 170 lb 10.2 oz (77.4 kg)    Intake/Output:   Intake/Output Summary (Last 24 hours) at 12/18/14 0701 Last data filed at 12/18/14 0600  Gross per 24 hour  Intake 1774.5 ml  Output   2675 ml  Net -900.5 ml     Physical Exam: CVP 4  General: Lying in bed  HEENT: Normal.  Neck:  JVP flat. No carotid bruits or masses noted.  Cardiac: Irregular irregular rate and rhythm. III/VI systolic murmur at the apex noted. + s3. Respiratory: Lungs are clear to auscultation bilaterally with normal work of breathing.  GI: Soft and nontender. Belly distended Extremities: RFem IABP. Warm trace edema. DPs + bilaterally  RUE PICC Skin: Warm and dry. Color is normal.  Neuro: Strength and sensation are intact and no gross focal deficits noted.  Psych: Alert, appropriate and with normal affect.  Telemetry: AF 80-90s Labs: Basic Metabolic Panel:  Recent Labs Lab 12/15/14 0300 12/15/14 0652 12/16/14 0440 12/16/14 1827 12/16/14 2055  12/17/14 0515 12/18/14 0525  NA 134*  --  135  --  133* 128* 127*  K 4.0  --  3.9  --  4.3 4.0 4.6  CL 99*  --  101  --  100* 94* 93*  CO2 27  --  27  --  24 24 24   GLUCOSE 146*  --  131*  --  113* 269* 232*  BUN 43*  --  31*  --  29* 24* 21*  CREATININE 2.36*  --  1.88*  --  1.80* 1.48* 1.40*  CALCIUM 9.0  --  8.8*  --  8.9 8.4* 8.6*  MG  --  2.0  --  2.1  --  1.8  --     Liver Function Tests:  Recent Labs Lab 12/14/14 1203 12/14/14 1359 12/18/14 0525  AST 25 26 21   ALT 36 36 24  ALKPHOS 89 88 99  BILITOT 0.9 1.1 1.4*  PROT 8.3* 7.7 7.4  ALBUMIN 3.8 3.5 2.9*   No results for input(s): LIPASE, AMYLASE in the last 168 hours. No results for input(s): AMMONIA in the last 168 hours.  CBC:  Recent Labs Lab 12/14/14 1359 12/15/14 0300 12/16/14 0440 12/17/14 0515 12/18/14 0525  WBC 6.1 6.4 7.2 10.1 9.6  NEUTROABS 2.9  --   --   --   --  HGB 13.8 12.8* 14.5 14.0 14.4  HCT 42.0 39.3 44.3 43.5 43.2  MCV 67.4* 66.3* 66.8* 65.9* 65.9*  PLT 235 271 199 190 169    Cardiac Enzymes: No results for input(s): CKTOTAL, CKMB, CKMBINDEX, TROPONINI in the last 168 hours.  BNP: BNP (last 3 results)  Recent Labs  11/30/14 1325 12/14/14 1203 12/14/14 1359  BNP 857.9* 1341.9* 1473.6*    ProBNP (last 3 results)  Recent Labs  12/10/14 1214  PROBNP 1807.0*      Other results:  Imaging: Dg Chest Port 1 View  12/16/2014   CLINICAL DATA:  Central line placement  EXAM: PORTABLE CHEST - 1 VIEW  COMPARISON:  Portable exam at 1333 hr compared to 0509 hours  FINDINGS: LEFT subclavian AICD with lead tip projecting RIGHT ventricle.  RIGHT jugular Swan-Ganz catheter with tip projecting over proximal RIGHT pulmonary artery.  Intra-aortic balloon pump has been partially withdrawn with tip now projecting over the inferior aspect of the aortic arch.  Enlargement of cardiac silhouette with slight vascular congestion.  Lungs clear, with decrease in perihilar edema seen on previous  exam.  No pleural effusion or pneumothorax.  IMPRESSION: Position of support devices as above.  Enlargement of cardiac silhouette with improved edema.   Electronically Signed   By: Lavonia Dana M.D.   On: 12/16/2014 13:48     Medications:     Scheduled Medications: . Chlorhexidine Gluconate Cloth  6 each Topical Daily  . furosemide  40 mg Oral BID  . mupirocin ointment  1 application Nasal BID  . pneumococcal 23 valent vaccine  0.5 mL Intramuscular Tomorrow-1000  . potassium chloride  20 mEq Oral BID  . sodium chloride  10-40 mL Intracatheter Q12H  . sodium chloride  3 mL Intravenous Q12H  . sodium chloride  3 mL Intravenous Q12H  . sodium chloride  3 mL Intravenous Q12H    Infusions: . amiodarone 30 mg/hr (12/18/14 0644)  . heparin 1,000 Units/hr (12/18/14 0200)  . milrinone 0.375 mcg/kg/min (12/18/14 0200)  . norepinephrine (LEVOPHED) Adult infusion 10 mcg/min (12/18/14 0100)    PRN Medications:  sodium chloride, sodium chloride, sodium chloride, acetaminophen, diphenhydrAMINE, fentaNYL, midazolam, ondansetron (ZOFRAN) IV, sodium chloride, sodium chloride, sodium chloride, sodium chloride   Assessment:   1. Cardiogenic shock 2. Acute/chonic systolic HF      -- NICM EF 20-25% 3. Chronic AF on Eliquis 4. Severe MR 5. Acute on chronic renal failure, stage III-IV (creatinine 1.4 -> 2.0) 6. Blood Type O+      Plan/Discussion:    Continue IABP + dual inotropes --> milrinone 0.375 mcg + levo 72mcg. CO-OX 62% . BP running high. Cut back levo to 7.5 mcg. Continue current regimen. Renal function continues to trend down. Today creatinine is 1.40. Volume status stable. Continue po lasix.   ECHO 8/29 EF 20-25%.   Remains on heparin for IABP and chronic AF. Rate is controlled.    Anticipate  VAD this week. Discussed at [facility] with plans to implant HMII on Thursday.  Dr. Mirian Mo, SW, Palliative Care, and VAD team have seen.   Length of Stay: 4  CLEGG,AMY NP-C  12/18/2014, 7:01  AM  Advanced Heart Failure Team Pager [phone] (M-F; [place])  Please contact [facility] for night-coverage after hours (4p -7a ) and weekends on [email or web address]  Patient seen and examined with Darrick Grinder, NP. We discussed all aspects of the encounter. I agree with the assessment and plan as stated above.   Doing very  well on IABP and dual pressors. For VAD on Thursday. Discussed with patient and Dr. Prescott Gum.   The patient is critically ill with multiple organ systems failure and requires high complexity decision making for assessment and support, frequent evaluation and titration of therapies, application of advanced monitoring technologies and extensive interpretation of multiple databases.   Critical Care Time devoted to patient care services described in this note is 35 Minutes. Patte Winkel,MD 9:52 AM

## 2014-12-18 NOTE — Progress Notes (Signed)
CSW met with patient at bedside. Patient stated that he had some hip discomfort earlier and is sleepy from medication. Patient reports he is feeling better about the upcoming LVAD surgery. He stated having the LVAD patients visit him assisted with better comfort level. Patient's sisters returned home to [place] and will return for surgery on Thursday. Patient although sleepy was positive and motivated for LVAD. CSW will continue to follow for supportive intervention during surgery and throughout recovery. [name], [place]

## 2014-12-19 DIAGNOSIS — I5023 Acute on chronic systolic (congestive) heart failure: Secondary | ICD-10-CM

## 2014-12-19 LAB — LUPUS ANTICOAGULANT PANEL
DRVVT: 41.6 s (ref 0.0–55.1)
PTT Lupus Anticoagulant: 76.3 s — ABNORMAL HIGH (ref 0.0–50.0)

## 2014-12-19 LAB — CBC
HEMATOCRIT: 45.1 % (ref 39.0–52.0)
HEMOGLOBIN: 15 g/dL (ref 13.0–17.0)
MCH: 22 pg — AB (ref 26.0–34.0)
MCHC: 33.3 g/dL (ref 30.0–36.0)
MCV: 66 fL — AB (ref 78.0–100.0)
PLATELETS: 184 10*3/uL (ref 150–400)
RBC: 6.83 MIL/uL — AB (ref 4.22–5.81)
RDW: 17.2 % — AB (ref 11.5–15.5)
WBC: 9.2 10*3/uL (ref 4.0–10.5)

## 2014-12-19 LAB — PTT-LA MIX: PTT-LA MIX: 61 s — AB (ref 0.0–50.0)

## 2014-12-19 LAB — HEXAGONAL PHASE PHOSPHOLIPID: HEXAGONAL PHASE PHOSPHOLIPID: 0.4 s (ref 0.0–8.0)

## 2014-12-19 LAB — CARBOXYHEMOGLOBIN
Carboxyhemoglobin: 1.2 % (ref 0.5–1.5)
METHEMOGLOBIN: 0.8 % (ref 0.0–1.5)
O2 Saturation: 62 %
Total hemoglobin: 15.3 g/dL (ref 13.5–18.0)

## 2014-12-19 LAB — GLUCOSE, CAPILLARY
Glucose-Capillary: 183 mg/dL — ABNORMAL HIGH (ref 65–99)
Glucose-Capillary: 121 mg/dL — ABNORMAL HIGH (ref 65–99)
Glucose-Capillary: 172 mg/dL — ABNORMAL HIGH (ref 65–99)
Glucose-Capillary: 86 mg/dL (ref 65–99)

## 2014-12-19 LAB — HEMOGLOBIN A1C
Hgb A1c MFr Bld: 7.3 % — ABNORMAL HIGH (ref 4.8–5.6)
Mean Plasma Glucose: 163 mg/dL

## 2014-12-19 LAB — BASIC METABOLIC PANEL
Anion gap: 10 (ref 5–15)
BUN: 21 mg/dL — AB (ref 6–20)
CO2: 23 mmol/L (ref 22–32)
CREATININE: 1.43 mg/dL — AB (ref 0.61–1.24)
Calcium: 9.1 mg/dL (ref 8.9–10.3)
Chloride: 96 mmol/L — ABNORMAL LOW (ref 101–111)
GFR calc Af Amer: >60 mL/min (ref >60)
GFR, EST NON AFRICAN AMERICAN: 57 mL/min — AB (ref >60)
GLUCOSE: 176 mg/dL — AB (ref 65–99)
POTASSIUM: 4.9 mmol/L (ref 3.5–5.1)
SODIUM: 129 mmol/L — AB (ref 135–145)

## 2014-12-19 LAB — HEPARIN LEVEL (UNFRACTIONATED): HEPARIN UNFRACTIONATED: 0.41 [IU]/mL (ref 0.30–0.70)

## 2014-12-19 MED ORDER — DEXTROSE 5 % IV SOLN
1.5000 g | INTRAVENOUS | Status: AC
  Administered 2014-12-20: .75 g via INTRAVENOUS
  Administered 2014-12-20: 1.5 g via INTRAVENOUS
  Filled 2014-12-19: qty 1.5

## 2014-12-19 MED ORDER — MAGNESIUM SULFATE 50 % IJ SOLN
40.0000 meq | INTRAMUSCULAR | Status: DC
  Filled 2014-12-19: qty 10

## 2014-12-19 MED ORDER — SODIUM CHLORIDE 0.9 % IV SOLN
INTRAVENOUS | Status: DC
  Filled 2014-12-19: qty 40

## 2014-12-19 MED ORDER — SODIUM CHLORIDE 0.9 % IV SOLN
600.0000 mg | INTRAVENOUS | Status: AC
  Administered 2014-12-20: 600 mg via INTRAVENOUS
  Filled 2014-12-19: qty 600

## 2014-12-19 MED ORDER — MILRINONE IN DEXTROSE 20 MG/100ML IV SOLN
0.3000 ug/kg/min | INTRAVENOUS | Status: AC
  Administered 2014-12-20: 0.375 ug/kg/min via INTRAVENOUS
  Filled 2014-12-19: qty 100

## 2014-12-19 MED ORDER — EPINEPHRINE HCL 1 MG/ML IJ SOLN
0.0000 ug/min | INTRAVENOUS | Status: AC
  Administered 2014-12-20: 3 ug/min via INTRAVENOUS
  Filled 2014-12-19: qty 4

## 2014-12-19 MED ORDER — DIAZEPAM 5 MG PO TABS
5.0000 mg | ORAL_TABLET | Freq: Once | ORAL | Status: AC
  Administered 2014-12-20: 5 mg via ORAL
  Filled 2014-12-19: qty 1

## 2014-12-19 MED ORDER — DEXMEDETOMIDINE HCL IN NACL 400 MCG/100ML IV SOLN
0.1000 ug/kg/h | INTRAVENOUS | Status: DC
  Administered 2014-12-20: 0.7 ug/kg/h via INTRAVENOUS
  Filled 2014-12-19 (×2): qty 100

## 2014-12-19 MED ORDER — FLUCONAZOLE IN SODIUM CHLORIDE 400-0.9 MG/200ML-% IV SOLN
400.0000 mg | INTRAVENOUS | Status: AC
  Administered 2014-12-20: 400 mg via INTRAVENOUS
  Filled 2014-12-19: qty 200

## 2014-12-19 MED ORDER — VASOPRESSIN 20 UNIT/ML IV SOLN
0.0400 [IU]/min | INTRAVENOUS | Status: DC
  Filled 2014-12-19: qty 2

## 2014-12-19 MED ORDER — NOREPINEPHRINE BITARTRATE 1 MG/ML IV SOLN
0.0000 ug/min | INTRAVENOUS | Status: AC
  Administered 2014-12-20: 7.5 ug/min via INTRAVENOUS
  Filled 2014-12-19: qty 4

## 2014-12-19 MED ORDER — DOPAMINE-DEXTROSE 3.2-5 MG/ML-% IV SOLN
0.0000 ug/kg/min | INTRAVENOUS | Status: DC
  Filled 2014-12-19: qty 250

## 2014-12-19 MED ORDER — ALPRAZOLAM 0.25 MG PO TABS: 0.2500 mg | ORAL_TABLET | ORAL | Status: DC | PRN

## 2014-12-19 MED ORDER — SODIUM CHLORIDE 0.9 % IV SOLN
INTRAVENOUS | Status: DC
  Filled 2014-12-19: qty 2.5

## 2014-12-19 MED ORDER — CHLORHEXIDINE GLUCONATE CLOTH 2 % EX PADS
6.0000 | MEDICATED_PAD | Freq: Once | CUTANEOUS | Status: AC
  Administered 2014-12-20: 6 via TOPICAL

## 2014-12-19 MED ORDER — CHLORHEXIDINE GLUCONATE CLOTH 2 % EX PADS
6.0000 | MEDICATED_PAD | Freq: Once | CUTANEOUS | Status: AC
  Administered 2014-12-19: 6 via TOPICAL

## 2014-12-19 MED ORDER — SODIUM CHLORIDE 0.9 % IV SOLN
1250.0000 mg | INTRAVENOUS | Status: DC
  Filled 2014-12-19: qty 1250

## 2014-12-19 MED ORDER — BISACODYL 5 MG PO TBEC: 5.0000 mg | DELAYED_RELEASE_TABLET | Freq: Once | ORAL | Status: DC

## 2014-12-19 MED ORDER — PHENYLEPHRINE HCL 10 MG/ML IJ SOLN
0.0000 ug/min | INTRAVENOUS | Status: DC
  Filled 2014-12-19: qty 2

## 2014-12-19 MED ORDER — TEMAZEPAM 15 MG PO CAPS: 15.0000 mg | ORAL_CAPSULE | Freq: Once | ORAL | Status: DC | PRN

## 2014-12-19 MED ORDER — VANCOMYCIN HCL 1000 MG IV SOLR
1000.0000 mg | INTRAVENOUS | Status: AC
  Administered 2014-12-20: 1000 mg
  Filled 2014-12-19: qty 1000

## 2014-12-19 MED ORDER — DEXTROSE 5 % IV SOLN
750.0000 mg | INTRAVENOUS | Status: DC
  Filled 2014-12-19: qty 750

## 2014-12-19 MED ORDER — MUPIROCIN 2 % EX OINT
1.0000 "application " | TOPICAL_OINTMENT | Freq: Two times a day (BID) | CUTANEOUS | Status: DC
  Administered 2014-12-19: 1 via NASAL

## 2014-12-19 MED ORDER — SODIUM CHLORIDE 0.9 % IV SOLN
600.0000 mg | INTRAVENOUS | Status: DC
  Filled 2014-12-19: qty 600

## 2014-12-19 MED ORDER — NITROGLYCERIN IN D5W 200-5 MCG/ML-% IV SOLN
0.0000 ug/min | INTRAVENOUS | Status: DC
  Filled 2014-12-19: qty 250

## 2014-12-19 MED ORDER — SODIUM CHLORIDE 0.9 % IV SOLN
INTRAVENOUS | Status: DC
  Filled 2014-12-19: qty 30

## 2014-12-19 MED ORDER — DOBUTAMINE IN D5W 4-5 MG/ML-% IV SOLN
2.0000 ug/kg/min | INTRAVENOUS | Status: DC
  Filled 2014-12-19: qty 250

## 2014-12-19 MED ORDER — POTASSIUM CHLORIDE 2 MEQ/ML IV SOLN
80.0000 meq | INTRAVENOUS | Status: DC
  Filled 2014-12-19: qty 40

## 2014-12-19 NOTE — Progress Notes (Signed)
  Anesthesiology Pre-op Note:  44 year old male with severe non-ischemic cardiomyopathy scheduled for Heartmate II LVAD tomorrow for destination therapy by Dr. Prescott Gum. He was admitted on 12/10/14 with cardiogenic shock, abdominal pain, and bloating after an earlier admission 8/12-8/17  for syncope and hypotension felt due to gastroenteritis.   Now stabilized with milrinone, levophed and IABP  Problems:  1. NICM EF 20-25% by ECHO, LVEDD 61 mm, moderate to severe MR and severe TR. 2. Longstanding chronic atrial fib, off eliquis since 8/26, now on heparin. 3. 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 4. Acute renal insufficiency,  Cr. 1.43 down from 2.41 on admission. 5. H/O massive GI bleed Jul 21, 2010 requiring 11 units 6. Hypertension 7. Gout  VS: T-36.8 BP 144/102 HR 92 (afib) Rr 12 O2 sat 98% on RA CVP 6  PA Sat 61.8% on 8/26  Na 129 K- 4.9 BUN/Cr 21/1.43 glucose 176 H/H 12.3/39 platelets 271,000  Anesthetic plan explained questions answered, I told him that he can expect to remain intubated for 1-3 days following the surgery. He understands and agrees to proceed.  Roberts Gaudy

## 2014-12-19 NOTE — Progress Notes (Signed)
Chart and all data reviewed, patient examined. He has NICM with an EF of 20-25% and NYHA class IV symptoms, Intermacs 2 on Milrinone .375 and levophed with IABP in place.   CVP 6 Creatinine 1.40  Co-ox 62 %  Subjective:  He feels ok today. No shortness of breath. Only complaint is of leg pain from laying in bed and not moving.   Objective: Vital signs in last 24 hours: Temp:  [97.9 F (36.6 C)-98.3 F (36.8 C)] 98 F (36.7 C) (08/31 1600) Pulse Rate:  [25-175] 42 (08/31 1800) Cardiac Rhythm:  [-] Atrial fibrillation;Atrial flutter (08/31 1600) Resp:  [12-30] 27 (08/31 1800) BP: (113-156)/(71-112) 133/75 mmHg (08/31 1800) SpO2:  [92 %-100 %] 98 % (08/31 1800) Weight:  [77.4 kg (170 lb 10.2 oz)] 77.4 kg (170 lb 10.2 oz) (08/31 0500)  Hemodynamic parameters for last 24 hours: CVP:  [2 mmHg-7 mmHg] 5 mmHg  Intake/Output from previous day: 08/30 0701 - 08/31 0700 In: 1520.8 [P.O.:240; I.V.:1280.8] Out: [id number] [Urine:1455] Intake/Output this shift:   Physical Exam:  General: Lying in bed  HEENT: Normal.  Neck: JVP flat. No carotid bruits or masses noted.  Cardiac: Irregular irregular rate and rhythm. III/VI systolic murmur at the apex noted. + s3. Respiratory: Lungs are clear to auscultation bilaterally  GI: Soft and nontender. nondistended Extremities: Right femoral  IABP. Warm with trace edema. DPs + bilaterally RUE PICC Skin: Warm and dry. Color is normal.  Neuro: Strength and sensation are intact and no gross focal deficits noted.  Psych: Alert, appropriate and with normal affect.  Lab Results:  Recent Labs  12/18/14 0525 12/19/14 0540  WBC 9.6 9.2  HGB 14.4 15.0  HCT 43.2 45.1  PLT 169 184   BMET:  Recent Labs  12/18/14 0525 12/19/14 0540  NA 127* 129*  K 4.6 4.9  CL 93* 96*  CO2 24 23  GLUCOSE 232* 176*  BUN 21* 21*  CREATININE 1.40* 1.43*  CALCIUM 8.6* 9.1    PT/INR: No results for input(s): LABPROT, INR in the last 72 hours. ABG     Component Value Date/Time   PHART 7.453* 12/17/2014 1530   HCO3 24.0 12/17/2014 1530   TCO2 25.1 12/17/2014 1530   ACIDBASEDEF 2.0 12/14/2014 1803   O2SAT 62.0 12/19/2014 0550   CBG (last 3)   Recent Labs  12/19/14 0820 12/19/14 1257 12/19/14 1836  GLUCAP 183* 121* 172*    Assessment/Plan:  He is stable on IABP and Milrinone, levophed. He was discussed with Dr. Pilar Plate from [facility] transplant team and felt to be appropriate for Heartmate II LVAD as DT with later transplant evaluation when stable. I discussed the operative procedure with the patient  including alternatives, benefits and risks; including but not limited to bleeding, blood transfusion, infection, stroke, myocardial infarction, organ dysfunction, pump malfunction or thrombosis requiring replacement and death.  Inez Catalina understands and agrees to proceed.  We will schedule surgery for the AM.   LOS: 5 days    Gaye Pollack 12/19/2014

## 2014-12-19 NOTE — Progress Notes (Signed)
ANTICOAGULATION CONSULT NOTE - Follow Up Consult  Pharmacy Consult for Heparin Indication: IABP  No Known Allergies  Patient Measurements: Height: 5\' 7"  (170.2 cm) Weight: 170 lb 10.2 oz (77.4 kg) IBW/kg (Calculated) : 66.1  Vital Signs: Temp: 98.3 F (36.8 C) (08/31 1200) Temp Source: Oral (08/31 1200) BP: 138/112 mmHg (08/31 1400) Pulse Rate: 149 (08/31 1400)  Labs:  Recent Labs  12/16/14 1826  12/17/14 0515 12/17/14 1230 12/17/14 1237 12/18/14 0525 12/19/14 0540  HGB  --   < > 14.0  --   --  14.4 15.0  HCT  --   --  43.5  --   --  43.2 45.1  PLT  --   --  190  --   --  169 184  APTT 72*  --  89*  --  68*  --   --   HEPARINUNFRC  --   < > 0.67 0.46  --  0.40 0.41  CREATININE  --   < > 1.48*  --   --  1.40* 1.43*  < > = values in this interval not displayed.  Estimated Creatinine Clearance: 60.3 mL/min (by C-G formula based on Cr of 1.43).  Assessment: 44yom on eliquis pta for hx DVT/afib, admitted with decompensated heart failure.Eliquis held and he was transitioned to IV heparin. Underwent cath with IABP placed. He continues on heparin with a therapeutic heparin level. CBC stable. No bleeding reported.  Goal of Therapy:  aPTT 60-85 seconds; heparin level 0.2-0.5 Monitor platelets by anticoagulation protocol: Yes   Plan:  1) Continue heparin at 1000 units/hr 2) Plan for LVAD tomorrow  Nena Jordan, PharmD, BCPS  12/19/2014 2:15 PM

## 2014-12-19 NOTE — Progress Notes (Signed)
[name] has been discussed with the VAD Medical Review board on 12/17/14 . The team feels as if the patient is a good candidate for Destination LVAD therapy. The patient meets criteria for a LVAD implant as listed below:  1)  Has failed to respond to optimal medical management (including beta-blockers and ACE inhibitors if tolerated) for at least 45 of the last 60 days, or have been balloon dependent for 7 days, or IV inotrope dependent for 14 days; and,       *On Inotropes Milrinone started 12/14/14        Norepinephrine started 12/14/14  2)  Has a left ventricular ejection fraction (LVEF) < 25% and, have demonstrated functional limitation with a peak oxygen consumption of <14 ml/kg/min unless balloon pump or inotrope dependent or physically unable to perform the test.         *EF 20 - 25%  By echo 12/01/14            *CPX - unable to complete d/t milrinone  3)  Social work and palliative care evaluations demonstrate appropriate support system in place for discharge to home with a VAD and that end of life discussions have taken place. Both services have expressed no concern regarding patient's candidacy.         *Social work consult: 12/17/14        *Palliative Care Consult: 12/17/14  4)  Primary caretaker identified that can be taught along with the patient how to manage        the VAD equipment.        *Name: Sadarius Norman and Tera Mater (sisters)  5)  Deemed appropriate by our financial coordinator: Cecilio Asper         Prior approval: 12/18/14  6)  VAD Coordinator, Zada Girt has met with patient and caregiver, shown them the VAD equipment and discussed with the patient and caregiver about lifestyle changes necessary for success on mechanical circulatory device.        *Met with patient and sisters on 12/17/14; met with patient again on 12/19/14.       *Consent for VAD Evaluation/Caregiver Agreement/HIPPA Release/Photo Release signed on 12/15/14 by [name], [place]  Coordinator  7)  Patient has been discussed with [facility] (Dr. Artemio Aly) by Dr. Haroldine Laws and felt to be an appropriate candidate for destination therapy given his blood type O and dependence on IABP and dual inotropes at this time. Once stable, he may then be evaluated for transplant eligibility.    8)  Intermacs profile: 2  INTERMACS 1: Critical cardiogenic shock describes a patient who is "crashing and burning", in which a patient has life-threatening hypotension and rapidly escalating inotropic pressor support, with critical organ hypoperfusion often confirmed by worsening acidosis and lactate levels.  INTERMACS 2: Progressive decline describes a patient who has been demonstrated "dependent" on inotropic support but nonetheless shows signs of continuing deterioration in nutrition, renal function, fluid retention, or other major status indicator. Patient profile 2 can also describe a patient with refractory volume overload, perhaps with evidence of impaired perfusion, in whom inotropic infusions cannot be maintained due to tachyarrhythmias, clinical ischemia, or other intolerance.  INTERMACS 3: Stable but inotrope dependent describes a patient who is clinically stable on mild-moderate doses of intravenous inotropes (or has a temporary circulatory support device) after repeated documentation of failure to wean without symptomatic hypotension, worsening symptoms, or progressive organ dysfunction (usually renal). It is  critical to monitor nutrition, renal function, fluid balance, and overall status carefully in order to distinguish between a  patient who is truly stable at Patient Profile 3 and a patient who has unappreciated decline rendering them Patient Profile 2. This patient may be either at home or in the hospital.   INTERMACS 4: Resting symptoms describes a patient who is at home on oral therapy but frequently has symptoms of congestion at rest or with activities of  daily living (ADL). He or she may have orthopnea, shortness of breath during ADL such as dressing or bathing, gastrointestinal symptoms (abdominal discomfort, nausea, poor appetite), disabling ascites or severe lower extremity edema. This patient should be carefully considered for more intensive management and surveillance programs, which may in some cases, reveal poor compliance that would compromise outcomes with any therapy.  .  INTERMACS 5: Exertion Intolerant describes a patient who is comfortable at rest but unable to engage in any activity, living predominantly within the house or housebound. This patient has no congestive symptoms, but may have chronically elevated volume status, frequently with renal dysfunction, and may be characterized as exercise intolerant.   INTERMACS 6: Exertion Limited also describes a patient who is comfortable at rest without evidence of fluid overload, but who is able to do some mild activity. Activities of daily living are comfortable and minor activities outside the home such as visiting friends or going to a restaurant can be performed, but fatigue results within a few minutes of any meaningful physical exertion. This patient has occasional episodes of worsening symptoms and is likely to have had a hospitalization for heart failure within the past year.   INTERMACS 7: Advanced NYHA Class 3 describes a patient who is clinically stable with a reasonable level of comfortable activity, despite history of previous decompensation that is not recent. This patient is usually able to walk more than a block. Any decompensation requiring intravenous diuretics or hospitalization within the previous month should make this person a Patient Profile 6 or lower.    9)  NYHA Class: IV  Caoimhe Damron,MD 5:56 PM

## 2014-12-19 NOTE — Progress Notes (Signed)
Advanced Heart Failure Rounding Note   Subjective:    [name] is a 44 y.o. 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  Admitted 8/26 with cardiogenic shock. PCWP 35 and co-ox 20%. Started on milrinone + levo. IABP placed.  8/28Luiz Blare removed. PICC placed.  Remains on levo 7.5 mcg + milrinone 0.375 mcg.  Feeling ok. Denies SOB/Orthopnea. Plan for VAD tomorrow.   CVP 6 Creatinine 1.40  Co-ox 62 %      Objective:   Weight Range:  Vital Signs:   Temp:  [97.6 F (36.4 C)-98.3 F (36.8 C)] 98.2 F (36.8 C) (08/31 0800) Pulse Rate:  [31-175] 55 (08/31 1100) Resp:  [11-30] 18 (08/31 1100) BP: (113-156)/(75-102) 141/94 mmHg (08/31 1100) SpO2:  [92 %-100 %] 98 % (08/31 1100) Weight:  [77.4 kg (170 lb 10.2 oz)] 77.4 kg (170 lb 10.2 oz) (08/31 0500) Last BM Date: 12/18/14  Weight change: Filed Weights   12/17/14 0500 12/18/14 0500 12/19/14 0500  Weight: 78 kg (171 lb 15.3 oz) 77.4 kg (170 lb 10.2 oz) 77.4 kg (170 lb 10.2 oz)    Intake/Output:   Intake/Output Summary (Last 24 hours) at 12/19/14 1150 Last data filed at 12/19/14 1100  Gross per 24 hour  Intake 1603.3 ml  Output   1750 ml  Net -146.7 ml     Physical Exam: General: Lying in bed  HEENT: Normal.  Neck:  JVP flat. No carotid bruits or masses noted.  Cardiac: Irregular irregular rate and rhythm. III/VI systolic murmur at the apex noted. + s3. Respiratory: Lungs are clear to auscultation bilaterally with normal work of breathing.  GI: Soft and nontender. nondistended Extremities: RFem IABP. Warm trace edema. DPs + bilaterally  RUE PICC Skin: Warm and dry. Color is normal.  Neuro: Strength and sensation are intact and no gross focal deficits noted.  Psych: Alert, appropriate and with normal affect.  Telemetry: AF 90-105 Labs: Basic Metabolic Panel:  Recent Labs Lab 12/15/14 0652 12/16/14 0440  12/16/14 1827 12/16/14 2055 12/17/14 0515 12/18/14 0525 12/19/14 0540  NA  --  135  --  133* 128* 127* 129*  K  --  3.9  --  4.3 4.0 4.6 4.9  CL  --  101  --  100* 94* 93* 96*  CO2  --  27  --  24 24 24 23   GLUCOSE  --  131*  --  113* 269* 232* 176*  BUN  --  31*  --  29* 24* 21* 21*  CREATININE  --  1.88*  --  1.80* 1.48* 1.40* 1.43*  CALCIUM  --  8.8*  --  8.9 8.4* 8.6* 9.1  MG 2.0  --  2.1  --  1.8  --   --     Liver Function Tests:  Recent Labs Lab 12/14/14 1203 12/14/14 1359 12/18/14 0525  AST 25 26 21   ALT 36 36 24  ALKPHOS 89 88 99  BILITOT 0.9 1.1 1.4*  PROT 8.3* 7.7 7.4  ALBUMIN 3.8 3.5 2.9*   No results for input(s): LIPASE, AMYLASE in the last 168 hours. No results for input(s): AMMONIA in the last 168 hours.  CBC:  Recent Labs Lab 12/14/14 1359 12/15/14 0300 12/16/14 0440 12/17/14 0515 12/18/14 0525 12/19/14 0540  WBC 6.1 6.4 7.2 10.1 9.6 9.2  NEUTROABS 2.9  --   --   --   --   --  HGB 13.8 12.8* 14.5 14.0 14.4 15.0  HCT 42.0 39.3 44.3 43.5 43.2 45.1  MCV 67.4* 66.3* 66.8* 65.9* 65.9* 66.0*  PLT 235 271 199 190 169 184    Cardiac Enzymes: No results for input(s): CKTOTAL, CKMB, CKMBINDEX, TROPONINI in the last 168 hours.  BNP: BNP (last 3 results)  Recent Labs  11/30/14 1325 12/14/14 1203 12/14/14 1359  BNP 857.9* 1341.9* 1473.6*    ProBNP (last 3 results)  Recent Labs  12/10/14 1214  PROBNP 1807.0*      Other results:  Imaging: Dg Chest Port 1 View  12/18/2014   CLINICAL DATA:  Heart failure.  Shortness of breath.  EXAM: PORTABLE CHEST - 1 VIEW  COMPARISON:  12/16/2014 chest radiograph.  FINDINGS: Right PICC terminates in the right atrium near the cavoatrial junction. Left subclavian ICD is noted with lead tips overlying the right ventricle and left lateral chest wall. Intra-aortic balloon pump marker overlies the proximal descending thoracic aorta at the level of T6. Stable cardiomediastinal silhouette with moderate to  marked cardiomegaly. No pneumothorax. No pleural effusion. Low lung volumes. Stable mild pulmonary edema. No new focal lung opacity.  IMPRESSION: 1. Stable moderate to marked cardiomegaly with mild pulmonary edema, in keeping with mild congestive heart failure. 2. Low lung volumes.   Electronically Signed   By: Ilona Sorrel M.D.   On: 12/18/2014 11:50     Medications:     Scheduled Medications: . Chlorhexidine Gluconate Cloth  6 each Topical Daily  . furosemide  40 mg Oral BID  . insulin aspart  0-15 Units Subcutaneous TID WC  . mupirocin ointment  1 application Nasal BID  . pneumococcal 23 valent vaccine  0.5 mL Intramuscular Tomorrow-1000  . potassium chloride  20 mEq Oral BID  . sodium chloride  10-40 mL Intracatheter Q12H  . sodium chloride  3 mL Intravenous Q12H  . sodium chloride  3 mL Intravenous Q12H  . sodium chloride  3 mL Intravenous Q12H    Infusions: . amiodarone 30 mg/hr (12/19/14 0800)  . heparin 1,000 Units/hr (12/19/14 0017)  . milrinone 0.375 mcg/kg/min (12/19/14 0016)  . norepinephrine (LEVOPHED) Adult infusion 7.5 mcg/min (12/18/14 2100)    PRN Medications:  sodium chloride, sodium chloride, sodium chloride, acetaminophen, diphenhydrAMINE, fentaNYL (SUBLIMAZE) injection, midazolam, ondansetron (ZOFRAN) IV, sodium chloride, sodium chloride, sodium chloride, sodium chloride, traMADol   Assessment:   1. Cardiogenic shock 2. Acute/chonic systolic HF      -- NICM EF 20-25% 3. Chronic AF on Eliquis 4. Severe MR 5. Acute on chronic renal failure, stage III-IV (creatinine 1.4 -> 2.0) 6. Blood Type O+      Plan/Discussion:    Doing very well on IABP and dual pressors. For VAD tomorrow under DT criteria. SSI started yesterday for mildly elevated CBGs.   I discussed case with Dr. Artemio Aly from the transplant team at [facility]. Given blood type O and dependence on IABP and dual inotropes agree with decision to proceed with LVAD placement under  DT criteria. Once stable will then evaluate for transplant eligibility.   The patient is critically ill with multiple organ systems failure and requires high complexity decision making for assessment and support, frequent evaluation and titration of therapies, application of advanced monitoring technologies and extensive interpretation of multiple databases.   Critical Care Time devoted to patient care services described in this note is 35 Minutes. Luc Shammas,MD 11:50 AM Advanced Heart Failure Team Pager [phone] (M-F; 7a - 4p)  Please contact [facility] for night-coverage after hours (4p -7a ) and weekends on [email or web address]

## 2014-12-19 NOTE — Progress Notes (Signed)
CSW met with patient at bedside. Patient was in good spirits today and states he is feeling much better about VAD surgery tomorrow. CSW spoke at length about recovery and support form LVAD team and support group. Patient is looking forward to attending group and meeting other VAD patients. Patient stated that meeting VAD patient prior to surgery was very helpful and reduced his anxiety about surgery, recovery and life with a VAD. CSW will continue to follow for support and any other needs as they arise. Raquel Sarna, [place]

## 2014-12-19 NOTE — Progress Notes (Signed)
Daily Progress Note   Patient Name: [name]       Date: 12/19/2014 DOB: 10-Jan-1971  Age: 44 y.o. MRN#: [id number] Attending Physician: Jolaine Artist, MD Primary Care Physician: Sandi Mariscal, MD Admit Date: 12/14/2014  Reason for Consultation/Follow-up: VAD eval  Subjective:     [name] is in better spirits tomorrow and motivated to get his VAD surgery over (planned tomorrow). I provided support to him. He tells me that he has been able to come to terms with where he is and is motivated to have the surgery behind him. We discussed HCPOA again and he tells me that his sisters will return this evening. He wished to look over this so I gave him the papers. He says he has no further questions/concerns. Support provided and we discussed relaxation techniques.    Length of Stay: 5 days  Current Medications: Scheduled Meds:  . Chlorhexidine Gluconate Cloth  6 each Topical Daily  . furosemide  40 mg Oral BID  . insulin aspart  0-15 Units Subcutaneous TID WC  . mupirocin ointment  1 application Nasal BID  . pneumococcal 23 valent vaccine  0.5 mL Intramuscular Tomorrow-1000  . potassium chloride  20 mEq Oral BID  . sodium chloride  10-40 mL Intracatheter Q12H  . sodium chloride  3 mL Intravenous Q12H  . sodium chloride  3 mL Intravenous Q12H  . sodium chloride  3 mL Intravenous Q12H    Continuous Infusions: . amiodarone 30 mg/hr (12/19/14 0800)  . heparin 1,000 Units/hr (12/19/14 0017)  . milrinone 0.375 mcg/kg/min (12/19/14 0016)  . norepinephrine (LEVOPHED) Adult infusion 7.5 mcg/min (12/18/14 2100)    PRN Meds: sodium chloride, sodium chloride, sodium chloride, acetaminophen, diphenhydrAMINE, fentaNYL (SUBLIMAZE) injection, midazolam, ondansetron (ZOFRAN) IV, sodium chloride, sodium chloride, sodium chloride, sodium chloride, traMADol  Palliative Performance Scale: 30%     Vital Signs: BP 131/85 mmHg  Pulse 35  Temp(Src) 98.2 F (36.8 C) (Oral)  Resp 30  Ht 5\' 7"   (1.702 m)  Wt 77.4 kg (170 lb 10.2 oz)  BMI 26.72 kg/m2  SpO2 97% SpO2: SpO2: 97 % O2 Device: O2 Device: Not Delivered O2 Flow Rate:    Intake/output summary:  Intake/Output Summary (Last 24 hours) at 12/19/14 1020 Last data filed at 12/19/14 1000  Gross per 24 hour  Intake 1603.3 ml  Output   1750 ml  Net -146.7 ml   LBM: Last BM Date: 12/18/14 Baseline Weight: Weight: 78.6 kg (173 lb 4.5 oz) Most recent weight: Weight: 77.4 kg (170 lb 10.2 oz)  Physical Exam: General: NAD, lying in bed, tearful at times HEENT: Joes/AT, moist mucous membranes Resp: No labored breathing Neuro: Awake, alert, oriented x 3    Additional Data Reviewed: Recent Labs     12/18/14  0525  12/19/14  0540  WBC  9.6  9.2  HGB  14.4  15.0  PLT  169  184  NA  127*  129*  BUN  21*  21*  CREATININE  1.40*  1.43*     Problem List:  Patient Active Problem List   Diagnosis Date Noted  . Palliative care encounter 12/17/2014  . Cardiogenic shock   . Acute on chronic systolic CHF (congestive heart failure)   . Acute on chronic renal failure   . Chronic atrial fibrillation   . Low output heart failure 12/14/2014  . Acute abdominal pain   . Near syncope   . Hypotension 11/30/2014  . AKI (acute kidney injury)   .  Abnormal LFTs 10/26/2012  . Hyperthyroidism 05/26/2011  . 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 ICD-[employer] 07/22/2010  . Nonischemic cardiomyopathy 07/22/2010  . Essential hypertension, benign 11/21/2008  . Atrial fibrillation 10/23/2008  . SYSTOLIC HEART FAILURE, CHRONIC 10/23/2008     Palliative Care Assessment & Plan    Code Status:  Full code  Goals of Care:  Proceed with VAD.   3. Symptom Management:  Anxiety: Discussed therapeutic techniques to reduce anxiety including breathing techniques, distraction, imagery. He might need low dose medication prn especially if sleep becomes effected.   Pain: Hip/leg pain just from lying in bed - utilizing  heat and reposition.    5. Prognosis: Unable to determine  5. Discharge Planning: Home with Home Health eventually  Thank you for allowing the Palliative Medicine Team to assist in the care of this patient.   Time In: 0930 Time Out: 0950 Total Time 26min Prolonged Time Billed  no     Greater than 50%  of this time was spent counseling and coordinating care related to the above assessment and plan.     Vinie Sill, NP Palliative Medicine Team Pager # [phone] (M-F 8a-5p) Team Phone # [phone] (Nights/Weekends)  12/19/2014, 10:20 AM

## 2014-12-19 NOTE — Progress Notes (Signed)
VAD teaching continued with patient by VAD coordinator.  Explained that LVAD can be implanted for two indications: 1. Bridge to transplant - used for patients who cannot safely wait for heart transplant.  2. Destination therapy - used for patients until end of life or recovery of heart function.   Equipment demonstration with HM II training loop included the following:   a) power module  b) system controller  c) universal [employer] fundraiser  d) battery clips  e) Batteries  f)  Perc lock  g) Percutaneous lead   Demonstrated:  a) changing power source on system controller from tethered (power module) to untethered (battery) mode  b) changing power source on system controller from untethered (battery) to tethered (power module) mode  c) how to monitor battery life both on the system controller and on each individual battery  d) changing batteries   Stressed importance of never disconnecting power from both controller power leads at the same time, and never disconnecting both batteries at the same time, or the pump will stop.   Stressed importance of performing home inspection and assuring that only three pronged grounded power outlets can be used for VAD equipment. Husband confirmed home has electrical outlets that will support VAD equipment when patient discharged home.   Identified the following changes in activities of daily living with pump:  1. No driving for at six weeks and then only if doctor gives permission to do so.  2. No tub baths while pump implanted, and shower only if doctor gives permission.  3. No swimming or submersion in water while implanted with pump.  4. No contact sports or engaging in jumping activities.  5. Always have a backup controller, charged spare batteries, and battery clips nearby at all times in case of emergency.  6. Plan to sleep only when connected to the power module.  7. Keep a backup system controller, charged batteries, battery clips, and  flashlight near you during sleep in case of electrical power outage.  8. Exit site care including dressing changes, monitoring for infection, and importance of keeping percutaneous lead stabilized at all times.   Also discussed need for 24 hour/7 day week caregivers; pt designated two sisters as his primary caregivers.  Stressed importance of designating 2 - 3 people to perform dressing changes to keep consistency with wound care. Discussed with pt that he will be required to purchase dressing supplies as long as patient has the VAD in place.   Patient had good interaction with VAD coordinator and asked appropriate questions; informed him family should be here prior to 6 am in the morning if they want to see him before he is taken to surgery. Pt provided both sisters contact info and asked that they be updated during the day tomorrow.

## 2014-12-20 ENCOUNTER — Inpatient Hospital Stay (HOSPITAL_COMMUNITY): Payer: 59

## 2014-12-20 ENCOUNTER — Inpatient Hospital Stay (HOSPITAL_COMMUNITY): Payer: 59 | Admitting: Anesthesiology

## 2014-12-20 ENCOUNTER — Encounter (HOSPITAL_COMMUNITY)
Admission: AD | Disposition: A | Discharge status: Home Health | Payer: 59 | Source: Ambulatory Visit | Attending: Surgery

## 2014-12-20 ENCOUNTER — Encounter (HOSPITAL_COMMUNITY): Payer: Self-pay | Admitting: Anesthesiology

## 2014-12-20 DIAGNOSIS — Z95811 Presence of heart assist device: Secondary | ICD-10-CM

## 2014-12-20 DIAGNOSIS — I5023 Acute on chronic systolic (congestive) heart failure: Secondary | ICD-10-CM

## 2014-12-20 HISTORY — PX: TEE WITHOUT CARDIOVERSION: SHX5443

## 2014-12-20 HISTORY — PX: INSERTION OF IMPLANTABLE LEFT VENTRICULAR ASSIST DEVICE: SHX5866

## 2014-12-20 LAB — POCT I-STAT, CHEM 8
BUN: 26 mg/dL — AB (ref 6–20)
BUN: 26 mg/dL — ABNORMAL HIGH (ref 6–20)
BUN: 24 mg/dL — ABNORMAL HIGH (ref 6–20)
BUN: 25 mg/dL — AB (ref 6–20)
BUN: 21 mg/dL — ABNORMAL HIGH (ref 6–20)
CALCIUM ION: 1.19 mmol/L (ref 1.12–1.23)
CALCIUM ION: 1.21 mmol/L (ref 1.12–1.23)
CHLORIDE: 94 mmol/L — AB (ref 101–111)
CHLORIDE: 100 mmol/L — AB (ref 101–111)
CREATININE: 1.1 mg/dL (ref 0.61–1.24)
CREATININE: 1.2 mg/dL (ref 0.61–1.24)
CREATININE: 1 mg/dL (ref 0.61–1.24)
Calcium, Ion: 1.16 mmol/L (ref 1.12–1.23)
Calcium, Ion: 1.17 mmol/L (ref 1.12–1.23)
Calcium, Ion: 0.95 mmol/L — ABNORMAL LOW (ref 1.12–1.23)
Chloride: 100 mmol/L — ABNORMAL LOW (ref 101–111)
Chloride: 100 mmol/L — ABNORMAL LOW (ref 101–111)
Chloride: 93 mmol/L — ABNORMAL LOW (ref 101–111)
Creatinine, Ser: 1.2 mg/dL (ref 0.61–1.24)
Creatinine, Ser: 1.2 mg/dL (ref 0.61–1.24)
GLUCOSE: 112 mg/dL — AB (ref 65–99)
GLUCOSE: 116 mg/dL — AB (ref 65–99)
GLUCOSE: 101 mg/dL — AB (ref 65–99)
GLUCOSE: 132 mg/dL — AB (ref 65–99)
GLUCOSE: 135 mg/dL — AB (ref 65–99)
HCT: 48 % (ref 39.0–52.0)
HCT: 37 % — ABNORMAL LOW (ref 39.0–52.0)
HCT: 30 % — ABNORMAL LOW (ref 39.0–52.0)
HCT: 33 % — ABNORMAL LOW (ref 39.0–52.0)
HEMATOCRIT: 51 % (ref 39.0–52.0)
HEMOGLOBIN: 16.3 g/dL (ref 13.0–17.0)
HEMOGLOBIN: 12.6 g/dL — AB (ref 13.0–17.0)
HEMOGLOBIN: 11.2 g/dL — AB (ref 13.0–17.0)
Hemoglobin: 17.3 g/dL — ABNORMAL HIGH (ref 13.0–17.0)
Hemoglobin: 10.2 g/dL — ABNORMAL LOW (ref 13.0–17.0)
POTASSIUM: 4.3 mmol/L (ref 3.5–5.1)
POTASSIUM: 4.1 mmol/L (ref 3.5–5.1)
POTASSIUM: 4.7 mmol/L (ref 3.5–5.1)
Potassium: 4.5 mmol/L (ref 3.5–5.1)
Potassium: 4.1 mmol/L (ref 3.5–5.1)
SODIUM: 134 mmol/L — AB (ref 135–145)
Sodium: 130 mmol/L — ABNORMAL LOW (ref 135–145)
Sodium: 131 mmol/L — ABNORMAL LOW (ref 135–145)
Sodium: 129 mmol/L — ABNORMAL LOW (ref 135–145)
Sodium: 129 mmol/L — ABNORMAL LOW (ref 135–145)
TCO2: 24 mmol/L (ref 0–100)
TCO2: 19 mmol/L (ref 0–100)
TCO2: 24 mmol/L (ref 0–100)
TCO2: 24 mmol/L (ref 0–100)
TCO2: 20 mmol/L (ref 0–100)

## 2014-12-20 LAB — POCT I-STAT 3, ART BLOOD GAS (G3+)
Acid-base deficit: 2 mmol/L (ref 0.0–2.0)
BICARBONATE: 24.3 meq/L — AB (ref 20.0–24.0)
BICARBONATE: 24 meq/L (ref 20.0–24.0)
Bicarbonate: 25.7 mEq/L — ABNORMAL HIGH (ref 20.0–24.0)
Bicarbonate: 22.2 mEq/L (ref 20.0–24.0)
O2 SAT: 100 %
O2 SAT: 100 %
O2 SAT: 98 %
O2 Saturation: 99 %
PCO2 ART: 38.5 mmHg (ref 35.0–45.0)
PCO2 ART: 33.4 mmHg — AB (ref 35.0–45.0)
PCO2 ART: 34.8 mmHg — AB (ref 35.0–45.0)
PH ART: 7.383 (ref 7.350–7.450)
PH ART: 7.416 (ref 7.350–7.450)
PO2 ART: 331 mmHg — AB (ref 80.0–100.0)
PO2 ART: 97 mmHg (ref 80.0–100.0)
TCO2: 25 mmol/L (ref 0–100)
TCO2: 27 mmol/L (ref 0–100)
TCO2: 25 mmol/L (ref 0–100)
TCO2: 23 mmol/L (ref 0–100)
pCO2 arterial: 43.1 mmHg (ref 35.0–45.0)
pH, Arterial: 7.408 (ref 7.350–7.450)
pH, Arterial: 7.462 — ABNORMAL HIGH (ref 7.350–7.450)
pO2, Arterial: 237 mmHg — ABNORMAL HIGH (ref 80.0–100.0)
pO2, Arterial: 157 mmHg — ABNORMAL HIGH (ref 80.0–100.0)

## 2014-12-20 LAB — BASIC METABOLIC PANEL
ANION GAP: 11 (ref 5–15)
ANION GAP: 7 (ref 5–15)
BUN: 24 mg/dL — AB (ref 6–20)
BUN: 21 mg/dL — AB (ref 6–20)
CALCIUM: 9 mg/dL (ref 8.9–10.3)
CALCIUM: 8.4 mg/dL — AB (ref 8.9–10.3)
CO2: 23 mmol/L (ref 22–32)
CO2: 23 mmol/L (ref 22–32)
CREATININE: 1.27 mg/dL — AB (ref 0.61–1.24)
Chloride: 92 mmol/L — ABNORMAL LOW (ref 101–111)
Chloride: 102 mmol/L (ref 101–111)
Creatinine, Ser: 1.41 mg/dL — ABNORMAL HIGH (ref 0.61–1.24)
GFR calc Af Amer: >60 mL/min (ref >60)
GFR calc Af Amer: >60 mL/min (ref >60)
GFR, EST NON AFRICAN AMERICAN: 58 mL/min — AB (ref >60)
GLUCOSE: 176 mg/dL — AB (ref 65–99)
GLUCOSE: 124 mg/dL — AB (ref 65–99)
POTASSIUM: 4.8 mmol/L (ref 3.5–5.1)
Potassium: 4.4 mmol/L (ref 3.5–5.1)
Sodium: 126 mmol/L — ABNORMAL LOW (ref 135–145)
Sodium: 132 mmol/L — ABNORMAL LOW (ref 135–145)

## 2014-12-20 LAB — CBC
HCT: 30.5 % — ABNORMAL LOW (ref 39.0–52.0)
HEMATOCRIT: 44.4 % (ref 39.0–52.0)
HEMATOCRIT: 29.2 % — AB (ref 39.0–52.0)
HEMOGLOBIN: 9.4 g/dL — AB (ref 13.0–17.0)
Hemoglobin: 14.8 g/dL (ref 13.0–17.0)
Hemoglobin: 9.9 g/dL — ABNORMAL LOW (ref 13.0–17.0)
MCH: 21.7 pg — AB (ref 26.0–34.0)
MCH: 21 pg — AB (ref 26.0–34.0)
MCH: 20.9 pg — AB (ref 26.0–34.0)
MCHC: 33.3 g/dL (ref 30.0–36.0)
MCHC: 32.5 g/dL (ref 30.0–36.0)
MCHC: 32.2 g/dL (ref 30.0–36.0)
MCV: 65 fL — AB (ref 78.0–100.0)
MCV: 64.8 fL — ABNORMAL LOW (ref 78.0–100.0)
MCV: 65 fL — AB (ref 78.0–100.0)
PLATELETS: 216 10*3/uL (ref 150–400)
PLATELETS: 152 10*3/uL (ref 150–400)
Platelets: 131 10*3/uL — ABNORMAL LOW (ref 150–400)
RBC: 6.83 MIL/uL — ABNORMAL HIGH (ref 4.22–5.81)
RBC: 4.71 MIL/uL (ref 4.22–5.81)
RBC: 4.49 MIL/uL (ref 4.22–5.81)
RDW: 16.6 % — AB (ref 11.5–15.5)
RDW: 16.2 % — AB (ref 11.5–15.5)
RDW: 16.3 % — ABNORMAL HIGH (ref 11.5–15.5)
WBC: 11.9 10*3/uL — AB (ref 4.0–10.5)
WBC: 10.4 10*3/uL (ref 4.0–10.5)
WBC: 10.3 10*3/uL (ref 4.0–10.5)

## 2014-12-20 LAB — CBC WITH DIFFERENTIAL/PLATELET
BASOS ABS: 0 10*3/uL (ref 0.0–0.1)
Basophils Relative: 0 % (ref 0–1)
Eosinophils Absolute: 0 10*3/uL (ref 0.0–0.7)
Eosinophils Relative: 0 % (ref 0–5)
HEMATOCRIT: 28.4 % — AB (ref 39.0–52.0)
HEMOGLOBIN: 9.3 g/dL — AB (ref 13.0–17.0)
LYMPHS PCT: 13 % (ref 12–46)
Lymphs Abs: 1.2 10*3/uL (ref 0.7–4.0)
MCH: 21.4 pg — ABNORMAL LOW (ref 26.0–34.0)
MCHC: 32.7 g/dL (ref 30.0–36.0)
MCV: 65.4 fL — AB (ref 78.0–100.0)
MONOS PCT: 10 % (ref 3–12)
Monocytes Absolute: 0.9 10*3/uL (ref 0.1–1.0)
NEUTROS ABS: 7.3 10*3/uL (ref 1.7–7.7)
NEUTROS PCT: 77 % (ref 43–77)
Platelets: 134 10*3/uL — ABNORMAL LOW (ref 150–400)
RBC: 4.34 MIL/uL (ref 4.22–5.81)
RDW: 16.7 % — AB (ref 11.5–15.5)
WBC: 9.4 10*3/uL (ref 4.0–10.5)

## 2014-12-20 LAB — POCT I-STAT EG7
Bicarbonate: 25.9 mEq/L — ABNORMAL HIGH (ref 20.0–24.0)
CALCIUM ION: 0.97 mmol/L — AB (ref 1.12–1.23)
HEMATOCRIT: 31 % — AB (ref 39.0–52.0)
HEMOGLOBIN: 10.5 g/dL — AB (ref 13.0–17.0)
O2 SAT: 68 %
PCO2 VEN: 49 mmHg (ref 45.0–50.0)
PH VEN: 7.331 — AB (ref 7.250–7.300)
Potassium: 4 mmol/L (ref 3.5–5.1)
SODIUM: 136 mmol/L (ref 135–145)
TCO2: 27 mmol/L (ref 0–100)
pO2, Ven: 38 mmHg (ref 30.0–45.0)

## 2014-12-20 LAB — DIC (DISSEMINATED INTRAVASCULAR COAGULATION) PANEL
APTT: 42 s — AB (ref 24–37)
D DIMER QUANT: 1.57 ug{FEU}/mL — AB (ref 0.00–0.48)
FIBRINOGEN: 559 mg/dL — AB (ref 204–475)
INR: 1.36 (ref 0.00–1.49)
PLATELETS: 147 10*3/uL — AB (ref 150–400)
SMEAR REVIEW: NONE SEEN

## 2014-12-20 LAB — URINALYSIS, ROUTINE W REFLEX MICROSCOPIC
BILIRUBIN URINE: NEGATIVE
Glucose, UA: NEGATIVE mg/dL
KETONES UR: NEGATIVE mg/dL
LEUKOCYTES UA: NEGATIVE
NITRITE: NEGATIVE
PH: 6 (ref 5.0–8.0)
PROTEIN: 30 mg/dL — AB
Specific Gravity, Urine: 1.012 (ref 1.005–1.030)
UROBILINOGEN UA: 2 mg/dL — AB (ref 0.0–1.0)

## 2014-12-20 LAB — POCT I-STAT 4, (NA,K, GLUC, HGB,HCT)
Glucose, Bld: 116 mg/dL — ABNORMAL HIGH (ref 65–99)
HEMATOCRIT: 35 % — AB (ref 39.0–52.0)
HEMOGLOBIN: 11.9 g/dL — AB (ref 13.0–17.0)
Potassium: 3.9 mmol/L (ref 3.5–5.1)
SODIUM: 133 mmol/L — AB (ref 135–145)

## 2014-12-20 LAB — GLUCOSE, CAPILLARY
GLUCOSE-CAPILLARY: 124 mg/dL — AB (ref 65–99)
GLUCOSE-CAPILLARY: 111 mg/dL — AB (ref 65–99)
GLUCOSE-CAPILLARY: 123 mg/dL — AB (ref 65–99)
GLUCOSE-CAPILLARY: 151 mg/dL — AB (ref 65–99)
GLUCOSE-CAPILLARY: 112 mg/dL — AB (ref 65–99)
Glucose-Capillary: 77 mg/dL (ref 65–99)
Glucose-Capillary: 128 mg/dL — ABNORMAL HIGH (ref 65–99)
Glucose-Capillary: 104 mg/dL — ABNORMAL HIGH (ref 65–99)

## 2014-12-20 LAB — PROTIME-INR
INR: 1.49 (ref 0.00–1.49)
Prothrombin Time: 18.1 seconds — ABNORMAL HIGH (ref 11.6–15.2)

## 2014-12-20 LAB — URINE MICROSCOPIC-ADD ON

## 2014-12-20 LAB — CARBOXYHEMOGLOBIN
CARBOXYHEMOGLOBIN: 1.4 % (ref 0.5–1.5)
Carboxyhemoglobin: 1.1 % (ref 0.5–1.5)
METHEMOGLOBIN: 1 % (ref 0.0–1.5)
Methemoglobin: 0.9 % (ref 0.0–1.5)
O2 SAT: 58.5 %
O2 Saturation: 54.9 %
Total hemoglobin: 15.6 g/dL (ref 13.5–18.0)
Total hemoglobin: 11 g/dL — ABNORMAL LOW (ref 13.5–18.0)

## 2014-12-20 LAB — CREATININE, SERUM
Creatinine, Ser: 1.29 mg/dL — ABNORMAL HIGH (ref 0.61–1.24)
GFR calc Af Amer: >60 mL/min (ref >60)

## 2014-12-20 LAB — HEMOGLOBIN AND HEMATOCRIT, BLOOD
HCT: 31.6 % — ABNORMAL LOW (ref 39.0–52.0)
Hemoglobin: 10.3 g/dL — ABNORMAL LOW (ref 13.0–17.0)

## 2014-12-20 LAB — PLATELET COUNT: Platelets: 102 10*3/uL — ABNORMAL LOW (ref 150–400)

## 2014-12-20 LAB — PREPARE RBC (CROSSMATCH)

## 2014-12-20 LAB — MAGNESIUM
MAGNESIUM: 2.3 mg/dL (ref 1.7–2.4)
Magnesium: 2.5 mg/dL — ABNORMAL HIGH (ref 1.7–2.4)

## 2014-12-20 LAB — DIC (DISSEMINATED INTRAVASCULAR COAGULATION)PANEL: Prothrombin Time: 16.9 seconds — ABNORMAL HIGH (ref 11.6–15.2)

## 2014-12-20 LAB — APTT: aPTT: 39 seconds — ABNORMAL HIGH (ref 24–37)

## 2014-12-20 LAB — POCT ACTIVATED CLOTTING TIME: Activated Clotting Time: 85 seconds

## 2014-12-20 SURGERY — INSERTION OF IMPLANTABLE LEFT VENTRICULAR ASSIST DEVICE
Anesthesia: General | Site: Chest

## 2014-12-20 MED ORDER — NITROGLYCERIN IN D5W 200-5 MCG/ML-% IV SOLN
0.0000 ug/min | INTRAVENOUS | Status: DC
Start: 2014-12-20 — End: 2014-12-24

## 2014-12-20 MED ORDER — METOCLOPRAMIDE HCL 5 MG/ML IJ SOLN
10.0000 mg | Freq: Four times a day (QID) | INTRAMUSCULAR | Status: DC
  Administered 2014-12-20 – 2014-12-25 (×19): 10 mg via INTRAVENOUS
  Filled 2014-12-20 (×22): qty 2

## 2014-12-20 MED ORDER — OXYCODONE HCL 5 MG PO TABS
5.0000 mg | ORAL_TABLET | ORAL | Status: DC | PRN
  Administered 2014-12-21 – 2014-12-25 (×18): 10 mg via ORAL
  Administered 2014-12-26 (×4): 5 mg via ORAL
  Administered 2014-12-31 – 2015-01-08 (×12): 10 mg via ORAL
  Filled 2014-12-20: qty 1
  Filled 2014-12-20 (×14): qty 2
  Filled 2014-12-20: qty 1
  Filled 2014-12-20 (×6): qty 2
  Filled 2014-12-20: qty 1
  Filled 2014-12-20 (×10): qty 2
  Filled 2014-12-20: qty 1
  Filled 2014-12-20: qty 2

## 2014-12-20 MED ORDER — BISACODYL 10 MG RE SUPP: 10.0000 mg | Freq: Every day | RECTAL | Status: DC

## 2014-12-20 MED ORDER — DEXMEDETOMIDINE HCL IN NACL 200 MCG/50ML IV SOLN
0.1000 ug/kg/h | INTRAVENOUS | Status: DC
  Administered 2014-12-20: 0.7 ug/kg/h via INTRAVENOUS
  Filled 2014-12-20: qty 50

## 2014-12-20 MED ORDER — DEXMEDETOMIDINE HCL IN NACL 200 MCG/50ML IV SOLN
INTRAVENOUS | Status: DC | PRN
Start: 2014-12-20 — End: 2014-12-20
  Administered 2014-12-20: .3 ug/kg/h via INTRAVENOUS

## 2014-12-20 MED ORDER — MIDAZOLAM HCL 5 MG/5ML IJ SOLN
INTRAMUSCULAR | Status: DC | PRN
  Administered 2014-12-20: 2 mg via INTRAVENOUS
  Administered 2014-12-20: 3 mg via INTRAVENOUS
  Administered 2014-12-20: 5 mg via INTRAVENOUS

## 2014-12-20 MED ORDER — FENTANYL CITRATE (PF) 250 MCG/5ML IJ SOLN
INTRAMUSCULAR | Status: AC
  Filled 2014-12-20: qty 5

## 2014-12-20 MED ORDER — LACTATED RINGERS IV SOLN
INTRAVENOUS | Status: DC | PRN
  Administered 2014-12-20: 08:00:00 via INTRAVENOUS

## 2014-12-20 MED ORDER — ACETAMINOPHEN 500 MG PO TABS
1000.0000 mg | ORAL_TABLET | Freq: Four times a day (QID) | ORAL | Status: AC
  Administered 2014-12-21 – 2014-12-25 (×16): 1000 mg via ORAL
  Filled 2014-12-20 (×18): qty 2

## 2014-12-20 MED ORDER — BISACODYL 5 MG PO TBEC
10.0000 mg | DELAYED_RELEASE_TABLET | Freq: Every day | ORAL | Status: DC
  Administered 2014-12-22 – 2014-12-31 (×8): 10 mg via ORAL
  Filled 2014-12-20 (×9): qty 2

## 2014-12-20 MED ORDER — SODIUM CHLORIDE 0.9 % IV SOLN: Freq: Once | INTRAVENOUS | Status: DC

## 2014-12-20 MED ORDER — PANTOPRAZOLE SODIUM 40 MG PO TBEC
40.0000 mg | DELAYED_RELEASE_TABLET | Freq: Every day | ORAL | Status: DC
  Administered 2014-12-22 – 2015-01-08 (×18): 40 mg via ORAL
  Filled 2014-12-20 (×18): qty 1

## 2014-12-20 MED ORDER — VANCOMYCIN HCL IN DEXTROSE 1-5 GM/200ML-% IV SOLN
1000.0000 mg | Freq: Two times a day (BID) | INTRAVENOUS | Status: AC
  Administered 2014-12-20 – 2014-12-21 (×3): 1000 mg via INTRAVENOUS
  Filled 2014-12-20 (×3): qty 200

## 2014-12-20 MED ORDER — DEXMEDETOMIDINE HCL IN NACL 400 MCG/100ML IV SOLN
0.1000 ug/kg/h | INTRAVENOUS | Status: DC
  Administered 2014-12-21: 0.7 ug/kg/h via INTRAVENOUS
  Filled 2014-12-20 (×2): qty 100

## 2014-12-20 MED ORDER — ASPIRIN 81 MG PO CHEW: 324.0000 mg | CHEWABLE_TABLET | Freq: Every day | ORAL | Status: DC

## 2014-12-20 MED ORDER — STERILE WATER FOR INJECTION IJ SOLN
INTRAMUSCULAR | Status: AC
  Filled 2014-12-20: qty 10

## 2014-12-20 MED ORDER — DOCUSATE SODIUM 100 MG PO CAPS
200.0000 mg | ORAL_CAPSULE | Freq: Every day | ORAL | Status: DC
  Administered 2014-12-22 – 2014-12-25 (×4): 200 mg via ORAL
  Filled 2014-12-20 (×5): qty 2

## 2014-12-20 MED ORDER — SODIUM CHLORIDE 0.9 % IV SOLN: 250.0000 mL | INTRAVENOUS | Status: DC

## 2014-12-20 MED ORDER — SODIUM CHLORIDE 0.9 % IV SOLN
INTRAVENOUS | Status: DC
  Administered 2014-12-20: 1.3 [IU]/h via INTRAVENOUS
  Filled 2014-12-20 (×2): qty 2.5

## 2014-12-20 MED ORDER — EPHEDRINE SULFATE 50 MG/ML IJ SOLN
INTRAMUSCULAR | Status: AC
  Filled 2014-12-20: qty 1

## 2014-12-20 MED ORDER — PROPOFOL 10 MG/ML IV BOLUS
INTRAVENOUS | Status: AC
  Filled 2014-12-20: qty 20

## 2014-12-20 MED ORDER — SODIUM CHLORIDE 0.9 % IV SOLN
10.0000 g | INTRAVENOUS | Status: DC | PRN
  Administered 2014-12-20: 5 g via INTRAVENOUS

## 2014-12-20 MED ORDER — PROTAMINE SULFATE 10 MG/ML IV SOLN
INTRAVENOUS | Status: AC
  Filled 2014-12-20: qty 25

## 2014-12-20 MED ORDER — DEXTROSE 5 % IV SOLN
1.5000 g | Freq: Two times a day (BID) | INTRAVENOUS | Status: AC
  Administered 2014-12-20 – 2014-12-22 (×4): 1.5 g via INTRAVENOUS
  Filled 2014-12-20 (×4): qty 1.5

## 2014-12-20 MED ORDER — POTASSIUM CHLORIDE 10 MEQ/50ML IV SOLN
10.0000 meq | INTRAVENOUS | Status: AC
  Administered 2014-12-20 (×3): 10 meq via INTRAVENOUS

## 2014-12-20 MED ORDER — LACTATED RINGERS IV SOLN: INTRAVENOUS | Status: DC

## 2014-12-20 MED ORDER — MORPHINE SULFATE (PF) 2 MG/ML IV SOLN
1.0000 mg | INTRAVENOUS | Status: AC | PRN
  Administered 2014-12-20: 4 mg via INTRAVENOUS
  Administered 2014-12-20 (×2): 2 mg via INTRAVENOUS
  Administered 2014-12-20: 4 mg via INTRAVENOUS
  Filled 2014-12-20: qty 2
  Filled 2014-12-20 (×2): qty 1

## 2014-12-20 MED ORDER — ACETAMINOPHEN 650 MG RE SUPP
650.0000 mg | Freq: Once | RECTAL | Status: AC
  Administered 2014-12-20: 650 mg via RECTAL

## 2014-12-20 MED ORDER — SODIUM CHLORIDE 0.9 % IV SOLN
250.0000 [IU] | INTRAVENOUS | Status: DC | PRN
  Administered 2014-12-20: 1.4 [IU]/h via INTRAVENOUS

## 2014-12-20 MED ORDER — CHLORHEXIDINE GLUCONATE 0.12 % MT SOLN
15.0000 mL | Freq: Two times a day (BID) | OROMUCOSAL | Status: DC
  Administered 2014-12-20 – 2014-12-24 (×7): 15 mL via OROMUCOSAL
  Filled 2014-12-20 (×5): qty 15

## 2014-12-20 MED ORDER — ETOMIDATE 2 MG/ML IV SOLN
INTRAVENOUS | Status: AC
  Filled 2014-12-20: qty 10

## 2014-12-20 MED ORDER — VECURONIUM BROMIDE 10 MG IV SOLR
INTRAVENOUS | Status: AC
  Filled 2014-12-20: qty 10

## 2014-12-20 MED ORDER — LIDOCAINE HCL (CARDIAC) 20 MG/ML IV SOLN
INTRAVENOUS | Status: AC
  Filled 2014-12-20: qty 5

## 2014-12-20 MED ORDER — SODIUM CHLORIDE 0.9 % IJ SOLN
INTRAMUSCULAR | Status: AC
  Filled 2014-12-20: qty 10

## 2014-12-20 MED ORDER — TRAMADOL HCL 50 MG PO TABS: 50.0000 mg | ORAL_TABLET | ORAL | Status: DC | PRN

## 2014-12-20 MED ORDER — PHENYLEPHRINE HCL 10 MG/ML IJ SOLN
0.0000 ug/min | INTRAVENOUS | Status: DC
  Filled 2014-12-20: qty 2

## 2014-12-20 MED ORDER — MIDAZOLAM HCL 2 MG/2ML IJ SOLN
2.0000 mg | INTRAMUSCULAR | Status: DC | PRN
  Administered 2014-12-20 – 2014-12-21 (×2): 2 mg via INTRAVENOUS
  Filled 2014-12-20 (×4): qty 2

## 2014-12-20 MED ORDER — AMIODARONE LOAD VIA INFUSION
150.0000 mg | Freq: Once | INTRAVENOUS | Status: DC
  Filled 2014-12-20: qty 83.34

## 2014-12-20 MED ORDER — EPINEPHRINE HCL 0.1 MG/ML IJ SOSY
PREFILLED_SYRINGE | INTRAMUSCULAR | Status: AC
Start: 2014-12-20 — End: 2014-12-20
  Filled 2014-12-20: qty 10

## 2014-12-20 MED ORDER — ANTISEPTIC ORAL RINSE SOLUTION (CORINZ)
7.0000 mL | Freq: Four times a day (QID) | OROMUCOSAL | Status: DC
  Administered 2014-12-20 – 2015-01-08 (×37): 7 mL via OROMUCOSAL

## 2014-12-20 MED ORDER — ACETAMINOPHEN 160 MG/5ML PO SOLN: 650.0000 mg | Freq: Once | ORAL | Status: AC

## 2014-12-20 MED ORDER — LACTATED RINGERS IV SOLN
INTRAVENOUS | Status: DC
  Administered 2014-12-20: 16:00:00 via INTRAVENOUS

## 2014-12-20 MED ORDER — DEXMEDETOMIDINE HCL IN NACL 200 MCG/50ML IV SOLN
INTRAVENOUS | Status: AC
  Filled 2014-12-20: qty 50

## 2014-12-20 MED ORDER — AMIODARONE HCL IN DEXTROSE 360-4.14 MG/200ML-% IV SOLN
60.0000 mg/h | INTRAVENOUS | Status: DC
  Filled 2014-12-20: qty 200

## 2014-12-20 MED ORDER — ALBUMIN HUMAN 5 % IV SOLN
250.0000 mL | INTRAVENOUS | Status: AC | PRN
  Administered 2014-12-20 (×4): 250 mL via INTRAVENOUS
  Filled 2014-12-20 (×2): qty 250

## 2014-12-20 MED ORDER — FAMOTIDINE IN NACL 20-0.9 MG/50ML-% IV SOLN
20.0000 mg | Freq: Two times a day (BID) | INTRAVENOUS | Status: AC
  Administered 2014-12-20 – 2014-12-21 (×2): 20 mg via INTRAVENOUS
  Filled 2014-12-20: qty 50

## 2014-12-20 MED ORDER — PROTAMINE SULFATE 10 MG/ML IV SOLN
INTRAVENOUS | Status: DC | PRN
  Administered 2014-12-20: 300 mg via INTRAVENOUS

## 2014-12-20 MED ORDER — CHLORHEXIDINE GLUCONATE CLOTH 2 % EX PADS
6.0000 | MEDICATED_PAD | Freq: Every day | CUTANEOUS | Status: DC
  Administered 2014-12-20 – 2014-12-23 (×3): 6 via TOPICAL

## 2014-12-20 MED ORDER — SODIUM CHLORIDE 0.45 % IV SOLN
INTRAVENOUS | Status: DC | PRN
  Administered 2014-12-20: 15:00:00 via INTRAVENOUS

## 2014-12-20 MED ORDER — LACTATED RINGERS IV SOLN: 500.0000 mL | Freq: Once | INTRAVENOUS | Status: DC | PRN

## 2014-12-20 MED ORDER — DEXTROSE 5 % IV SOLN
3.0000 ug/min | INTRAVENOUS | Status: DC
  Administered 2014-12-21 (×2): 4 ug/min via INTRAVENOUS
  Filled 2014-12-20 (×2): qty 4

## 2014-12-20 MED ORDER — VECURONIUM BROMIDE 10 MG IV SOLR
INTRAVENOUS | Status: DC | PRN
  Administered 2014-12-20: 2 mg via INTRAVENOUS
  Administered 2014-12-20: 5 mg via INTRAVENOUS
  Administered 2014-12-20: 2 mg via INTRAVENOUS
  Administered 2014-12-20: 5 mg via INTRAVENOUS

## 2014-12-20 MED ORDER — RIFAMPIN 300 MG PO CAPS
600.0000 mg | ORAL_CAPSULE | Freq: Once | ORAL | Status: AC
  Administered 2014-12-21: 600 mg via ORAL
  Filled 2014-12-20: qty 2

## 2014-12-20 MED ORDER — ACETAMINOPHEN 160 MG/5ML PO SOLN
1000.0000 mg | Freq: Four times a day (QID) | ORAL | Status: AC
  Administered 2014-12-20 – 2014-12-21 (×3): 1000 mg
  Filled 2014-12-20 (×3): qty 40.6

## 2014-12-20 MED ORDER — FLUCONAZOLE IN SODIUM CHLORIDE 400-0.9 MG/200ML-% IV SOLN
400.0000 mg | Freq: Once | INTRAVENOUS | Status: AC
  Administered 2014-12-21: 400 mg via INTRAVENOUS
  Filled 2014-12-20: qty 200

## 2014-12-20 MED ORDER — ASPIRIN EC 325 MG PO TBEC
325.0000 mg | DELAYED_RELEASE_TABLET | Freq: Every day | ORAL | Status: DC
  Administered 2014-12-22 – 2014-12-27 (×6): 325 mg via ORAL
  Filled 2014-12-20 (×9): qty 1

## 2014-12-20 MED ORDER — FENTANYL CITRATE (PF) 100 MCG/2ML IJ SOLN
INTRAMUSCULAR | Status: DC | PRN
  Administered 2014-12-20: 150 ug via INTRAVENOUS
  Administered 2014-12-20: 250 ug via INTRAVENOUS
  Administered 2014-12-20: 500 ug via INTRAVENOUS
  Administered 2014-12-20: 350 ug via INTRAVENOUS
  Administered 2014-12-20: 150 ug via INTRAVENOUS
  Administered 2014-12-20: 250 ug via INTRAVENOUS
  Administered 2014-12-20: 100 ug via INTRAVENOUS

## 2014-12-20 MED ORDER — MORPHINE SULFATE (PF) 2 MG/ML IV SOLN
2.0000 mg | INTRAVENOUS | Status: DC | PRN
  Administered 2014-12-20 – 2014-12-21 (×5): 4 mg via INTRAVENOUS
  Administered 2014-12-21: 2 mg via INTRAVENOUS
  Administered 2014-12-21 (×3): 4 mg via INTRAVENOUS
  Administered 2014-12-21 – 2014-12-22 (×3): 2 mg via INTRAVENOUS
  Administered 2014-12-22 (×6): 4 mg via INTRAVENOUS
  Administered 2014-12-23: 2 mg via INTRAVENOUS
  Administered 2014-12-23 – 2014-12-27 (×8): 4 mg via INTRAVENOUS
  Administered 2014-12-27 – 2014-12-28 (×4): 2 mg via INTRAVENOUS
  Administered 2014-12-29 – 2014-12-31 (×6): 4 mg via INTRAVENOUS
  Administered 2015-01-01 – 2015-01-02 (×3): 2 mg via INTRAVENOUS
  Administered 2015-01-03: 3 mg via INTRAVENOUS
  Administered 2015-01-03 – 2015-01-04 (×2): 2 mg via INTRAVENOUS
  Administered 2015-01-04: 3 mg via INTRAVENOUS
  Administered 2015-01-04: 4 mg via INTRAVENOUS
  Administered 2015-01-06 – 2015-01-07 (×4): 2 mg via INTRAVENOUS
  Filled 2014-12-20: qty 2
  Filled 2014-12-20 (×2): qty 1
  Filled 2014-12-20: qty 2
  Filled 2014-12-20 (×3): qty 1
  Filled 2014-12-20 (×3): qty 2
  Filled 2014-12-20: qty 1
  Filled 2014-12-20 (×2): qty 2
  Filled 2014-12-20: qty 1
  Filled 2014-12-20 (×3): qty 2
  Filled 2014-12-20: qty 1
  Filled 2014-12-20 (×2): qty 2
  Filled 2014-12-20: qty 1
  Filled 2014-12-20: qty 2
  Filled 2014-12-20: qty 1
  Filled 2014-12-20 (×5): qty 2
  Filled 2014-12-20 (×3): qty 1
  Filled 2014-12-20: qty 2
  Filled 2014-12-20 (×2): qty 1
  Filled 2014-12-20 (×5): qty 2
  Filled 2014-12-20: qty 1
  Filled 2014-12-20: qty 2
  Filled 2014-12-20: qty 1
  Filled 2014-12-20 (×2): qty 2
  Filled 2014-12-20: qty 1
  Filled 2014-12-20 (×6): qty 2
  Filled 2014-12-20: qty 1

## 2014-12-20 MED ORDER — ETOMIDATE 2 MG/ML IV SOLN
INTRAVENOUS | Status: DC | PRN
  Administered 2014-12-20: 12 mg via INTRAVENOUS

## 2014-12-20 MED ORDER — VANCOMYCIN HCL 1000 MG IV SOLR
2000.0000 mg | INTRAVENOUS | Status: DC
  Filled 2014-12-20: qty 2000

## 2014-12-20 MED ORDER — MIDAZOLAM HCL 10 MG/2ML IJ SOLN
INTRAMUSCULAR | Status: AC
  Filled 2014-12-20: qty 4

## 2014-12-20 MED ORDER — SODIUM CHLORIDE 0.9 % IJ SOLN
3.0000 mL | Freq: Two times a day (BID) | INTRAMUSCULAR | Status: DC
  Administered 2014-12-22: 10 mL via INTRAVENOUS
  Administered 2014-12-23 – 2014-12-30 (×4): 3 mL via INTRAVENOUS

## 2014-12-20 MED ORDER — HEMOSTATIC AGENTS (NO CHARGE) OPTIME
TOPICAL | Status: DC | PRN
  Administered 2014-12-20 (×2): 1 via TOPICAL

## 2014-12-20 MED ORDER — SODIUM CHLORIDE 0.9 % IJ SOLN: 3.0000 mL | INTRAMUSCULAR | Status: DC | PRN

## 2014-12-20 MED ORDER — VANCOMYCIN HCL 1000 MG IV SOLR
INTRAVENOUS | Status: DC
  Filled 2014-12-20: qty 1000

## 2014-12-20 MED ORDER — ASPIRIN 300 MG RE SUPP
300.0000 mg | Freq: Every day | RECTAL | Status: DC
  Filled 2014-12-20 (×8): qty 1

## 2014-12-20 MED ORDER — SODIUM CHLORIDE 0.9 % IV SOLN
INTRAVENOUS | Status: DC | PRN
  Administered 2014-12-20: 1000 mL

## 2014-12-20 MED ORDER — CALCIUM CHLORIDE 10 % IV SOLN
INTRAVENOUS | Status: AC
  Filled 2014-12-20: qty 10

## 2014-12-20 MED ORDER — VANCOMYCIN HCL 1000 MG IV SOLR
1000.0000 mg | INTRAVENOUS | Status: DC | PRN
  Administered 2014-12-20: 1250 mg via INTRAVENOUS

## 2014-12-20 MED ORDER — SODIUM CHLORIDE 0.9 % IV SOLN
INTRAVENOUS | Status: DC | PRN
  Administered 2014-12-20: 1000 mL via INTRAMUSCULAR

## 2014-12-20 MED ORDER — PHENYLEPHRINE 40 MCG/ML (10ML) SYRINGE FOR IV PUSH (FOR BLOOD PRESSURE SUPPORT)
PREFILLED_SYRINGE | INTRAVENOUS | Status: AC
  Filled 2014-12-20: qty 10

## 2014-12-20 MED ORDER — HEPARIN SODIUM (PORCINE) 1000 UNIT/ML IJ SOLN
INTRAMUSCULAR | Status: AC
  Filled 2014-12-20: qty 1

## 2014-12-20 MED ORDER — SUCCINYLCHOLINE CHLORIDE 20 MG/ML IJ SOLN
INTRAMUSCULAR | Status: AC
  Filled 2014-12-20: qty 1

## 2014-12-20 MED ORDER — ONDANSETRON HCL 4 MG/2ML IJ SOLN
INTRAMUSCULAR | Status: AC
  Filled 2014-12-20: qty 2

## 2014-12-20 MED ORDER — INSULIN REGULAR BOLUS VIA INFUSION
0.0000 [IU] | Freq: Three times a day (TID) | INTRAVENOUS | Status: DC
  Filled 2014-12-20: qty 10

## 2014-12-20 MED ORDER — ALBUMIN HUMAN 5 % IV SOLN
INTRAVENOUS | Status: DC | PRN
  Administered 2014-12-20 (×2): via INTRAVENOUS

## 2014-12-20 MED ORDER — ROCURONIUM BROMIDE 50 MG/5ML IV SOLN
INTRAVENOUS | Status: AC
  Filled 2014-12-20: qty 1

## 2014-12-20 MED ORDER — SODIUM CHLORIDE 0.9 % IV SOLN
INTRAVENOUS | Status: DC
  Administered 2014-12-20 – 2014-12-21 (×3): via INTRAVENOUS
  Administered 2014-12-31: 10 mL/h via INTRAVENOUS
  Administered 2015-01-01 – 2015-01-06 (×2): via INTRAVENOUS

## 2014-12-20 MED ORDER — HEPARIN SODIUM (PORCINE) 1000 UNIT/ML IJ SOLN
INTRAMUSCULAR | Status: DC | PRN
  Administered 2014-12-20: 30000 [IU] via INTRAVENOUS

## 2014-12-20 MED ORDER — ONDANSETRON HCL 4 MG/2ML IJ SOLN
4.0000 mg | Freq: Four times a day (QID) | INTRAMUSCULAR | Status: DC | PRN
  Administered 2014-12-22 – 2014-12-31 (×10): 4 mg via INTRAVENOUS
  Filled 2014-12-20 (×10): qty 2

## 2014-12-20 MED ORDER — ROCURONIUM BROMIDE 100 MG/10ML IV SOLN
INTRAVENOUS | Status: DC | PRN
  Administered 2014-12-20: 50 mg via INTRAVENOUS

## 2014-12-20 MED ORDER — MAGNESIUM SULFATE 4 GM/100ML IV SOLN
4.0000 g | Freq: Once | INTRAVENOUS | Status: AC
  Administered 2014-12-20: 4 g via INTRAVENOUS
  Filled 2014-12-20: qty 100

## 2014-12-20 MED ORDER — MUPIROCIN 2 % EX OINT
TOPICAL_OINTMENT | Freq: Two times a day (BID) | CUTANEOUS | Status: AC
  Administered 2014-12-20: 22:00:00 via NASAL
  Administered 2014-12-21: 1 via NASAL
  Filled 2014-12-20: qty 22

## 2014-12-20 MED ORDER — SODIUM CHLORIDE 0.9 % IR SOLN
Status: DC | PRN
  Administered 2014-12-20: 1000 mL

## 2014-12-20 MED FILL — Electrolyte-R (PH 7.4) Solution: INTRAVENOUS | Qty: 4000 | Status: AC

## 2014-12-20 MED FILL — Calcium Chloride Inj 10%: INTRAVENOUS | Qty: 10 | Status: AC

## 2014-12-20 MED FILL — Sodium Bicarbonate IV Soln 8.4%: INTRAVENOUS | Qty: 50 | Status: AC

## 2014-12-20 MED FILL — Mannitol IV Soln 20%: INTRAVENOUS | Qty: 500 | Status: AC

## 2014-12-20 MED FILL — Heparin Sodium (Porcine) Inj 1000 Unit/ML: INTRAMUSCULAR | Qty: 10 | Status: AC

## 2014-12-20 MED FILL — Sodium Chloride IV Soln 0.9%: INTRAVENOUS | Qty: 3000 | Status: AC

## 2014-12-20 MED FILL — Lidocaine HCl IV Inj 20 MG/ML: INTRAVENOUS | Qty: 5 | Status: AC

## 2014-12-20 SURGICAL SUPPLY — 138 items
ADAPTER CARDIO PERF ANTE/RETRO (ADAPTER) ×4 IMPLANT
ADAPTER DLP PERFUSION .25INX2I (MISCELLANEOUS) ×4 IMPLANT
ANTEGRADE CPLG (MISCELLANEOUS) IMPLANT
APPLICATOR COTTON TIP 6IN STRL (MISCELLANEOUS) IMPLANT
APPLICATOR TIP COSEAL (VASCULAR PRODUCTS) ×4 IMPLANT
ATTRACTOMAT 16X20 MAGNETIC DRP (DRAPES) ×4 IMPLANT
BAG DECANTER FOR FLEXI CONT (MISCELLANEOUS) ×16 IMPLANT
BLADE CLIPPER SURG (BLADE) ×4 IMPLANT
BLADE STERNUM SYSTEM 6 (BLADE) ×4 IMPLANT
BLADE SURG 12 STRL SS (BLADE) ×4 IMPLANT
BLADE SURG 15 STRL LF DISP TIS (BLADE) ×2 IMPLANT
BLADE SURG 15 STRL SS (BLADE) ×2
BOOT SUTURE AID YELLOW STND (SUTURE) ×4 IMPLANT
CANISTER SUCTION 2500CC (MISCELLANEOUS) ×4 IMPLANT
CANNULA AORTIC ROOT 20012 (MISCELLANEOUS) ×4 IMPLANT
CANNULA ARTERIAL NVNT 3/8 20FR (MISCELLANEOUS) ×8 IMPLANT
CANNULA GUNDRY RCSP 15FR (MISCELLANEOUS) IMPLANT
CANNULA VENOUS LOW PROF 34X46 (CANNULA) ×8 IMPLANT
CATH CPB KIT VANTRIGT (MISCELLANEOUS) IMPLANT
CATH FOLEY 2WAY SLVR  5CC 14FR (CATHETERS) ×4
CATH FOLEY 2WAY SLVR 5CC 14FR (CATHETERS) ×4 IMPLANT
CATH HEART VENT LEFT (CATHETERS) IMPLANT
CATH HYDRAGLIDE XL THORACIC (CATHETERS) ×8 IMPLANT
CATH RETROPLEGIA CORONARY 14FR (CATHETERS) IMPLANT
CATH ROBINSON RED A/P 18FR (CATHETERS) ×8 IMPLANT
CATH THORACIC 28FR (CATHETERS) IMPLANT
CATH THORACIC 36FR RT ANG (CATHETERS) IMPLANT
CHLORAPREP W/TINT 26ML (MISCELLANEOUS) ×4 IMPLANT
CLIP FOGARTY SPRING 6M (CLIP) IMPLANT
CONN 1/2X1/2X1/2  BEN (MISCELLANEOUS) ×2
CONN 1/2X1/2X1/2 BEN (MISCELLANEOUS) ×2 IMPLANT
CONN 3/8X1/2 ST GISH (MISCELLANEOUS) ×8 IMPLANT
CONN ST 1/4X3/8  BEN (MISCELLANEOUS) ×4
CONN ST 1/4X3/8 BEN (MISCELLANEOUS) ×4 IMPLANT
CONT SPEC 4OZ CLIKSEAL STRL BL (MISCELLANEOUS) ×8 IMPLANT
COVER SURGICAL LIGHT HANDLE (MISCELLANEOUS) ×4 IMPLANT
CRADLE DONUT ADULT HEAD (MISCELLANEOUS) ×4 IMPLANT
DERMABOND ADVANCED (GAUZE/BANDAGES/DRESSINGS) ×2
DERMABOND ADVANCED .7 DNX12 (GAUZE/BANDAGES/DRESSINGS) ×2 IMPLANT
DRAIN CHANNEL 28F RND 3/8 FF (WOUND CARE) IMPLANT
DRAIN CHANNEL 32F RND 10.7 FF (WOUND CARE) IMPLANT
DRAPE BILATERAL SPLIT (DRAPES) ×4 IMPLANT
DRAPE CV SPLIT W-CLR ANES SCRN (DRAPES) ×4 IMPLANT
DRAPE INCISE IOBAN 66X45 STRL (DRAPES) ×4 IMPLANT
DRAPE SLUSH/WARMER DISC (DRAPES) ×4 IMPLANT
DRSG AQUACEL AG ADV 3.5X14 (GAUZE/BANDAGES/DRESSINGS) ×4 IMPLANT
ELECT BLADE 4.0 EZ CLEAN MEGAD (MISCELLANEOUS) ×4
ELECT BLADE 6.5 EXT (BLADE) ×4 IMPLANT
ELECT CAUTERY BLADE 6.4 (BLADE) ×4 IMPLANT
ELECT REM PT RETURN 9FT ADLT (ELECTROSURGICAL) ×4
ELECTRODE BLDE 4.0 EZ CLN MEGD (MISCELLANEOUS) ×2 IMPLANT
ELECTRODE REM PT RTRN 9FT ADLT (ELECTROSURGICAL) ×2 IMPLANT
FELT TEFLON 6X6 (MISCELLANEOUS) ×4 IMPLANT
GAUZE SPONGE 4X4 12PLY STRL (GAUZE/BANDAGES/DRESSINGS) ×8 IMPLANT
GLOVE BIO SURGEON STRL SZ 6 (GLOVE) IMPLANT
GLOVE BIO SURGEON STRL SZ 6.5 (GLOVE) ×18 IMPLANT
GLOVE BIO SURGEON STRL SZ7 (GLOVE) ×8 IMPLANT
GLOVE BIO SURGEON STRL SZ7.5 (GLOVE) ×12 IMPLANT
GLOVE BIO SURGEON STRL SZ8.5 (GLOVE) ×8 IMPLANT
GLOVE BIO SURGEONS STRL SZ 6.5 (GLOVE) ×6
GLOVE BIOGEL PI IND STRL 6 (GLOVE) ×6 IMPLANT
GLOVE BIOGEL PI INDICATOR 6 (GLOVE) ×6
GOWN STRL REUS W/ TWL LRG LVL3 (GOWN DISPOSABLE) ×8 IMPLANT
GOWN STRL REUS W/ TWL XL LVL3 (GOWN DISPOSABLE) ×4 IMPLANT
GOWN STRL REUS W/TWL LRG LVL3 (GOWN DISPOSABLE) ×8
GOWN STRL REUS W/TWL XL LVL3 (GOWN DISPOSABLE) ×4
HEMOSTAT POWDER SURGIFOAM 1G (HEMOSTASIS) ×16 IMPLANT
HEMOSTAT SURGICEL 2X14 (HEMOSTASIS) IMPLANT
HM II VENTR. ASSIST DEVICE BP (Prosthesis & Implant Heart) ×4 IMPLANT
INSERT FOGARTY XLG (MISCELLANEOUS) IMPLANT
KIT BASIN OR (CUSTOM PROCEDURE TRAY) ×4 IMPLANT
KIT ROOM TURNOVER OR (KITS) ×4 IMPLANT
KIT SUCTION CATH 14FR (SUCTIONS) ×4 IMPLANT
LEAD PACING MYOCARDI (MISCELLANEOUS) ×8 IMPLANT
LINE VENT (MISCELLANEOUS) ×4 IMPLANT
MEMBRANE PERI 15X20 (Vascular Products) ×4 IMPLANT
NS IRRIG 1000ML POUR BTL (IV SOLUTION) ×20 IMPLANT
PACK OPEN HEART (CUSTOM PROCEDURE TRAY) ×4 IMPLANT
PAD ARMBOARD 7.5X6 YLW CONV (MISCELLANEOUS) ×8 IMPLANT
PAD DEFIB R2 (MISCELLANEOUS) ×4 IMPLANT
PEDIATRIC SUCKERS (MISCELLANEOUS) ×4 IMPLANT
PUNCH AORTIC ROTATE  4.5MM 8IN (MISCELLANEOUS) ×4 IMPLANT
PUNCH AORTIC ROTATE 4.5MM 8IN (MISCELLANEOUS) ×4 IMPLANT
SEALANT SURG COSEAL 8ML (VASCULAR PRODUCTS) ×4 IMPLANT
SET CARDIOPLEGIA MPS 5001102 (MISCELLANEOUS) ×4 IMPLANT
SHEATH AVANTI 11CM 5FR (MISCELLANEOUS) IMPLANT
SPONGE GAUZE 4X4 12PLY STER LF (GAUZE/BANDAGES/DRESSINGS) ×4 IMPLANT
STOPCOCK 4 WAY LG BORE MALE ST (IV SETS) ×4 IMPLANT
SUCKER INTRACARDIAC WEIGHTED (SUCKER) ×4 IMPLANT
SUCKER WEIGHTED FLEX (MISCELLANEOUS) ×4 IMPLANT
SUT ETHIBOND 2 0 SH (SUTURE) ×10 IMPLANT
SUT ETHIBOND 2 0 SH 36X2 (SUTURE) ×10 IMPLANT
SUT ETHIBOND 2 0 V4 (SUTURE) IMPLANT
SUT ETHIBOND 2 0V4 GREEN (SUTURE) IMPLANT
SUT ETHIBOND 4 0 TF (SUTURE) IMPLANT
SUT ETHIBOND 5 0 C 1 30 (SUTURE) IMPLANT
SUT ETHIBOND 5 LR DA (SUTURE) ×8 IMPLANT
SUT ETHIBOND NAB MH 2-0 36IN (SUTURE) ×56 IMPLANT
SUT ETHILON 3 0 FSL (SUTURE) ×4 IMPLANT
SUT PROLENE 3 0 RB 1 (SUTURE) IMPLANT
SUT PROLENE 3 0 SH 1 (SUTURE) ×4 IMPLANT
SUT PROLENE 3 0 SH DA (SUTURE) ×8 IMPLANT
SUT PROLENE 4 0 RB 1 (SUTURE) ×14
SUT PROLENE 4-0 RB1 .5 CRCL 36 (SUTURE) ×14 IMPLANT
SUT PROLENE 5 0 C 1 36 (SUTURE) ×4 IMPLANT
SUT PROLENE 5 0 C1 (SUTURE) IMPLANT
SUT PROLENE 6 0 C 1 30 (SUTURE) ×4 IMPLANT
SUT SILK  1 MH (SUTURE) ×14
SUT SILK 1 MH (SUTURE) ×14 IMPLANT
SUT SILK 1 TIES 10X30 (SUTURE) ×4 IMPLANT
SUT SILK 2 0 SH CR/8 (SUTURE) ×8 IMPLANT
SUT SILK 2 0SH CR/8 30 (SUTURE) ×4 IMPLANT
SUT STEEL 6MS V (SUTURE) ×8 IMPLANT
SUT STEEL SZ 6 DBL 3X14 BALL (SUTURE) ×4 IMPLANT
SUT TEM PAC WIRE 2 0 SH (SUTURE) IMPLANT
SUT VIC AB 1 CTX 18 (SUTURE) ×8 IMPLANT
SUT VIC AB 1 CTX 36 (SUTURE) ×4
SUT VIC AB 1 CTX36XBRD ANBCTR (SUTURE) ×4 IMPLANT
SUT VIC AB 2-0 CT1 27 (SUTURE) ×2
SUT VIC AB 2-0 CT1 TAPERPNT 27 (SUTURE) ×2 IMPLANT
SUT VIC AB 2-0 CTX 27 (SUTURE) ×8 IMPLANT
SUT VIC AB 3-0 SH 8-18 (SUTURE) ×4 IMPLANT
SUT VIC AB 3-0 X1 27 (SUTURE) ×12 IMPLANT
SUT VICRYL 2 TP 1 (SUTURE) ×4 IMPLANT
SYR 50ML LL SCALE MARK (SYRINGE) ×4 IMPLANT
SYS ARTICLIP LAA EXCLUSION 140 (Clip) ×4 IMPLANT
SYSTEM SAHARA CHEST DRAIN ATS (WOUND CARE) ×4 IMPLANT
TOWEL OR 17X24 6PK STRL BLUE (TOWEL DISPOSABLE) ×8 IMPLANT
TOWEL OR 17X26 10 PK STRL BLUE (TOWEL DISPOSABLE) ×8 IMPLANT
TRAY CATH LUMEN 1 20CM STRL (SET/KITS/TRAYS/PACK) ×4 IMPLANT
TRAY FOLEY IC TEMP SENS 14FR (CATHETERS) ×4 IMPLANT
TUBE CONNECTING 12'X1/4 (SUCTIONS) ×1
TUBE CONNECTING 12X1/4 (SUCTIONS) ×3 IMPLANT
TUBING ART PRESS 48 MALE/FEM (TUBING) ×8 IMPLANT
UNDERPAD 30X30 INCONTINENT (UNDERPADS AND DIAPERS) ×4 IMPLANT
VENT LEFT HEART 12002 (CATHETERS)
WATER STERILE IRR 1000ML POUR (IV SOLUTION) ×8 IMPLANT
YANKAUER SUCT BULB TIP NO VENT (SUCTIONS) ×4 IMPLANT

## 2014-12-20 NOTE — Anesthesia Preprocedure Evaluation (Signed)
Anesthesia Evaluation  Patient identified by MRN, date of birth, ID band Patient awake    Reviewed: Allergy & Precautions, NPO status , Patient's Chart, lab work & pertinent test results  Airway Mallampati: II  TM Distance: >3 FB     Dental   Pulmonary  + rhonchi   + decreased breath sounds      Cardiovascular hypertension, Rhythm:Irregular Rate:Normal     Neuro/Psych    GI/Hepatic   Endo/Other    Renal/GU      Musculoskeletal   Abdominal   Peds  Hematology   Anesthesia Other Findings   Reproductive/Obstetrics                             Anesthesia Physical Anesthesia Plan  ASA: IV  Anesthesia Plan: General   Post-op Pain Management:    Induction: Intravenous  Airway Management Planned: Oral ETT  Additional Equipment: Arterial line, PA Cath, CVP and 3D TEE  Intra-op Plan:   Post-operative Plan: Post-operative intubation/ventilation  Informed Consent: I have reviewed the patients History and Physical, chart, labs and discussed the procedure including the risks, benefits and alternatives for the proposed anesthesia with the patient or authorized representative who has indicated his/her understanding and acceptance.     Plan Discussed with: CRNA and Anesthesiologist  Anesthesia Plan Comments:         Anesthesia Quick Evaluation

## 2014-12-20 NOTE — Progress Notes (Signed)
CSW met with patient's family in the waiting area. Patient in the OR for VAD implantation. Family very supportive and stated that patient was in good spirits and very positive this morning prior to departure to the OR. CSW offered support and will continue to follow throughout recovery. Raquel Sarna, [place]

## 2014-12-20 NOTE — Progress Notes (Signed)
15 minute call made to SICU [facility], [phone]

## 2014-12-20 NOTE — Progress Notes (Signed)
*  PRELIMINARY RESULTS* Echocardiogram Echocardiogram Transesophageal has been performed.  [name] 12/20/2014, 9:32 AM

## 2014-12-20 NOTE — OR Nursing (Signed)
Forty minute call to SICU charge nurse at [phone].

## 2014-12-20 NOTE — CV Procedure (Signed)
Intra-operative Transesophageal Echocardiography Report:  [name] is a 44 year old male with severe nonischemic cardiomyopathy who is scheduled to undergo HeartMate 2 LVAD insertion for destination therapy. Intraoperative transesophageal echocardiography was indicated to assist with the procedure by assessing the pump function, right and left ventricular function, to assess for valvular disease and patent foramen ovale,  The patient was brought to the operating room at [facility]. An intra-aortic balloon pump was an operation when he arrived to the operating room. General anesthesia was induced without difficulty. Following endotracheal intubation and orogastric suctioning, the transesophageal echocardiography probe was inserted into the esophagus without difficulty.  Impression: Pre-bypass findings:  1. Aortic valve: The aortic valve appeared normal. The 3 leaflets were thin and pliable and opened normally without restriction. There was no aortic insufficiency.  2 Mitral valve: The mitral leaflets appeared mildly thickened but opened normally. The mitral annulus was dilated and the leaflet coaptation point was apically displaced due to lateral displacement of the papillary muscles due to LV enlargement. This resulted in a central jet of mitral insufficiency which was graded as 2+. There were no flail or prolapsing segments noted.  3. Left ventricle: The left ventricular cavity was enlarged and measured 6.65 cm at end diastole at the mid-papillary level in the transgastric short axis view. The anterior wall measured 1.05 cm in the posterior wall measured 1.00 cm at end diastole at the mid-papillary level in the transgastric short axis view. There was severe diffuse left ventricular hypokinesis. Left ventricle ejection fraction was estimated at 15-20%.  4. Right ventricle: The right ventricular cavity appeared to be normal size. There was normal appearing contractility the right  ventricular free wall. However the TAPSE measured 15.1 mm which is consistent with some degree of right ventricular dysfunction.  5. Tricuspid valve: The tricuspid annulus was dilated and measured 4.25 cm in the 4 chamber view and 4.18 cm in the RV inflow outflow view. There was 2+ tricuspid insufficiency. There was an AICD lead traversing the tricuspid valve.  6. Interatrial septum: The interatrial septum was intact without evidence of patent foramen ovale or atrial septal defect by color Doppler and careful bubble study using agitated albumin contrast.  7. Left atrium: There was no thrombus noted within the left atrium or left atrial appendage.  8. Ascending aorta: There was a well-defined aortic root and sinotubular ridge without effacement or enlargement. There was no atheromatous disease noted within the ascending aorta.  9. Descending aorta: The descending aorta measured 2.35 cm in diameter. An intra-aortic balloon pump was present and appeared to be well positioned. There was no atheromatous disease noted with the walls of the descending aorta.  Post-bypass Findings:  1. Aortic valve: The aortic valve appeared unchanged from the pre-bypass study. The leaflets opened normally without restriction there was no aortic insufficiency.  2. Mitral valve: The mitral leaflets appeared structurally normal. There was improved coaptation of the leaflets. There was 1+ mitral insufficiency.  3. Left ventricle: The LVAD cannula was well visualized and noted to be at the apex oriented towards the opening of the mitral valve. There was no turbulent flow at the entry site. There was laminar flow which appeared normal. Left ventricular end-diastolic diameter measured 6.2 cm in diameter which was reduced from the pre-bypass study. There was severe diffuse LV hypocontractility. The interventricular septum was assessed for LVAD pump function. There was no significant air within the left ventricular cavity upon  separation from cardiopulmonary bypass.  4. Right ventricle: Upon separation from  cardiopulmonary bypass, the right ventricular function intially appeared normal. However upon closure of the chest, there was some reduced contractility the right ventricular free wall and reduced right ventricular function.  5. Tricuspid valve: The tricuspid valve was well visualized there was 1-2+ tricuspid insufficiency following the procedure.  6. Interatrial septum: The interatrial septum was intact without evidence of patent foramen ovale or atrial septal defect by color Doppler.  Roberts Gaudy

## 2014-12-20 NOTE — Anesthesia Procedure Notes (Signed)
Procedure Name: Intubation Date/Time: 12/20/2014 7:54 AM Performed by: Rebekah Chesterfield L Pre-anesthesia Checklist: Patient identified, Emergency Drugs available, Suction available, Patient being monitored and Timeout performed Patient Re-evaluated:Patient Re-evaluated prior to inductionOxygen Delivery Method: Circle system utilized Preoxygenation: Pre-oxygenation with 100% oxygen Intubation Type: IV induction and Cricoid Pressure applied Ventilation: Mask ventilation without difficulty Laryngoscope Size: Mac and 3 Grade View: Grade III Tube type: Subglottic suction tube Tube size: 8.0 mm Number of attempts: 2 Airway Equipment and Method: Stylet Placement Confirmation: ETT inserted through vocal cords under direct vision,  breath sounds checked- equal and bilateral and positive ETCO2 Secured at: 21 cm Tube secured with: Tape Dental Injury: Teeth and Oropharynx as per pre-operative assessment  Comments: Paralytic not fully appreciated on first DL

## 2014-12-20 NOTE — Progress Notes (Signed)
VAD Team Rounding Note  Subjective:    Patient seen with Dr. Cyndia Bent on return from [place] after HMII implant.  Remains intubated on NO, milrinone, EPI 4  CVP 12, PAP 26/17, PCWP 13. Making urine. Flows on VAD remain on low end. Minimal drainage from tubes   LVAD INTERROGATION:  HeartMate II LVAD:  Flow 3.1 liters/min, speed 8600, power 3.9, PI 4.6.  Objective:    Vital Signs:   Temp:  [97.7 F (36.5 C)-100 F (37.8 C)] 100 F (37.8 C) (09/01 2015) Pulse Rate:  [25-157] 103 (09/01 2025) Resp:  [12-29] 22 (09/01 2025) BP: (91-145)/(77-115) 91/79 mmHg (09/01 2025) SpO2:  [97 %-100 %] 100 % (09/01 2025) Arterial Line BP: (87-131)/(54-92) 91/79 mmHg (09/01 2015) FiO2 (%):  [40 %-50 %] 40 % (09/01 2025) Weight:  [76.8 kg (169 lb 5 oz)] 76.8 kg (169 lb 5 oz) (09/01 0400) Last BM Date: 12/18/14 Mean arterial Pressure 80s  Intake/Output:   Intake/Output Summary (Last 24 hours) at 12/20/14 2102 Last data filed at 12/20/14 2000  Gross per 24 hour  Intake 6957.26 ml  Output   6300 ml  Net 657.26 ml     Physical Exam: General:  Intubated. sedated HEENT: normal + ETT Neck: supple. JVP RIJ swan  Cor Sternal dressing in place. Multiple CTs: Mechanical heart sounds with LVAD hum present. Lungs:+ rhonchis Abdomen: soft, nontender, +distended. Quiet bowel sounds. Driveline: C/D/I; securement device intact and driveline incorporated Extremities: no cyanosis, clubbing, rash, + edema Neuro: Intubated/sedated  Telemetry: AF  Labs: Basic Metabolic Panel:  Recent Labs Lab 12/15/14 0652  12/16/14 1827  12/17/14 0515 12/18/14 0525 12/19/14 0540 12/20/14 0350  12/20/14 1103 12/20/14 1228 12/20/14 1254 12/20/14 1435 12/20/14 1530 12/20/14 2005 12/20/14 2014 12/20/14 2020  NA  --   < >  --   < > 128* 127* 129* 126*  < > 129* 129* 136 133* 132*  --  134* 134*  K  --   < >  --   < > 4.0 4.6 4.9 4.8  < > 4.1 4.1 4.0 3.9 4.4  --  4.7 4.7  CL  --   < >  --   < > 94*  93* 96* 92*  < > 93* 94*  --   --  102  --  70* 100*  CO2  --   < >  --   < > 24 24 23 23   --   --   --   --   --  23  --   --   --   GLUCOSE  --   < >  --   < > 269* 232* 176* 176*  < > 101* 132*  --  116* 124*  --  141* 135*  BUN  --   < >  --   < > 24* 21* 21* 24*  < > 24* 25*  --   --  21*  --  23* 21*  CREATININE  --   < >  --   < > 1.48* 1.40* 1.43* 1.41*  < > 1.00 1.20  --   --  1.27* 1.29* 1.10 1.20  CALCIUM  --   < >  --   < > 8.4* 8.6* 9.1 9.0  --   --   --   --   --  8.4*  --   --   --   MG 2.0  --  2.1  --  1.8  --   --   --   --   --   --   --   --  2.3 2.5*  --   --   < > = values in this interval not displayed.  Liver Function Tests:  Recent Labs Lab 12/14/14 1203 12/14/14 1359 12/18/14 0525  AST 25 26 21   ALT 36 36 24  ALKPHOS 89 88 99  BILITOT 0.9 1.1 1.4*  PROT 8.3* 7.7 7.4  ALBUMIN 3.8 3.5 2.9*   No results for input(s): LIPASE, AMYLASE in the last 168 hours. No results for input(s): AMMONIA in the last 168 hours.  CBC:  Recent Labs Lab 12/14/14 1359  12/19/14 0540 12/20/14 0350  12/20/14 1135  12/20/14 1448 12/20/14 1459 12/20/14 1705 12/20/14 2005 12/20/14 2014 12/20/14 2020  WBC 6.1  < > 9.2 11.9*  --   --   --   --  10.4 10.3 9.4  --   --   NEUTROABS 2.9  --   --   --   --   --   --   --   --   --  7.3  --   --   HGB 13.8  < > 15.0 14.8  < > 10.3*  < >  --  9.9* 9.4* 9.3* 11.9* 11.2*  HCT 42.0  < > 45.1 44.4  < > 31.6*  < >  --  30.5* 29.2* 28.4* 35.0* 33.0*  MCV 67.4*  < > 66.0* 65.0*  --   --   --   --  64.8* 65.0* 65.4*  --   --   PLT 235  < > 184 216  --  102*  --  147* 152 131* 134*  --   --   < > = values in this interval not displayed.  INR:  Recent Labs Lab 12/14/14 1203 12/20/14 1448 12/20/14 1459  INR 2.23* 1.36 1.49    Other results:   Imaging: Dg Chest Port 1 View  12/20/2014   CLINICAL DATA:  Status post placement of a left ventricular assist device today.  EXAM: PORTABLE CHEST - 1 VIEW  COMPARISON:  Single view of the  chest 12/18/2014.  FINDINGS: There is partial visualization of a new left ventricular assist device. Two mediastinal drains and a left chest tube are in place. No pneumothorax is identified. The patient also has a new a right IJ approach Swan-Ganz catheter in place with the tip of the Swan in the descending right interlobar pulmonary artery. The catheter could be pulled back approximately 6 cm for positioning in the right main pulmonary artery. NG tube courses into the stomach with the tip below the inferior margin of film. Endotracheal tube tip is at the level of clavicular heads in good position. AICD is again noted.  Mild left basilar atelectasis is noted. The right lung is clear. No pleural effusion is identified. Cardiomegaly is seen as on the prior exam.  IMPRESSION: Swan-Ganz catheter tip projects in the descending right interlobar pulmonary artery. The line could be pulled back 6 cm for positioning in the right main pulmonary artery. Support apparatus is otherwise unremarkable. No pneumothorax.  Cardiomegaly without edema.   Electronically Signed   By: Inge Rise M.D.   On: 12/20/2014 16:29      Medications:     Scheduled Medications: . [START ON 12/21/2014] acetaminophen  1,000 mg Oral 4 times per day   Or  . [START ON  12/21/2014] acetaminophen (TYLENOL) oral liquid 160 mg/5 mL  1,000 mg Per Tube 4 times per day  . amiodarone  150 mg Intravenous Once  . antiseptic oral rinse  7 mL Mouth Rinse QID  . [START ON 12/21/2014] aspirin EC  325 mg Oral Daily   Or  . [START ON 12/21/2014] aspirin  324 mg Per Tube Daily   Or  . [START ON 12/21/2014] aspirin  300 mg Rectal Daily  . [START ON 12/21/2014] bisacodyl  10 mg Oral Daily   Or  . [START ON 12/21/2014] bisacodyl  10 mg Rectal Daily  . cefUROXime (ZINACEF)  IV  1.5 g Intravenous Q12H  . chlorhexidine  15 mL Mouth/Throat BID  . [START ON 12/21/2014] Chlorhexidine Gluconate Cloth  6 each Topical Q0600  . [START ON 12/21/2014] docusate sodium  200 mg  Oral Daily  . famotidine (PEPCID) IV  20 mg Intravenous Q12H  . [START ON 12/21/2014] fluconazole (DIFLUCAN) IV  400 mg Intravenous Once  . insulin regular  0-10 Units Intravenous TID WC  . metoCLOPramide (REGLAN) injection  10 mg Intravenous 4 times per day  . mupirocin ointment   Nasal BID  . [START ON 12/22/2014] pantoprazole  40 mg Oral Daily  . [START ON 12/21/2014] rifampin  600 mg Oral Once  . [START ON 12/21/2014] sodium chloride  3 mL Intravenous Q12H  . vancomycin  1,000 mg Intravenous Q12H     Infusions: . sodium chloride 20 mL/hr at 12/20/14 2000  . [START ON 12/21/2014] sodium chloride    . sodium chloride 20 mL/hr at 12/20/14 2000  . amiodarone 30 mg/hr (12/20/14 2000)  . dexmedetomidine 0.7 mcg/kg/hr (12/20/14 2000)  . epinephrine 4 mcg/min (12/20/14 2000)  . insulin (NOVOLIN-R) infusion 1.4 Units/hr (12/20/14 2026)  . lactated ringers Stopped (12/20/14 2000)  . lactated ringers 20 mL/hr (12/20/14 1515)  . milrinone 0.375 mcg/kg/min (12/20/14 2000)  . nitroGLYCERIN Stopped (12/20/14 1625)  . phenylephrine (NEO-SYNEPHRINE) Adult infusion       PRN Medications:  sodium chloride, albumin human, lactated ringers, midazolam, morphine injection, morphine injection, ondansetron (ZOFRAN) IV, oxyCODONE, [START ON 12/21/2014] sodium chloride, traMADol   Assessment:   1. Cardiogenic shock s/p HMII implant 12/20/2014 2. Acute/chonic systolic HF   -- NICM EF 0000000 3. Chronic AF 4. Expected EBL anemia 5. Blood Type O+   Plan/Discussion:    He is doing well immediately post-op from HM II VAD placement. Hemodynamics look good. Flows on VAD remain on low end but ok. Would continue current inotropes and current VAD settings. Keep CVP 12-15 for now. Recheck labs tonight. Transfuse as needed.   The patient is critically ill with multiple organ systems failure and requires high complexity decision making for assessment and support, frequent evaluation and titration of therapies,  application of advanced monitoring technologies and extensive interpretation of multiple databases.   Critical Care Time devoted to patient care services described in this note is 35 Minutes.   I reviewed the LVAD parameters from today, and compared the results to the patient's prior recorded data.  No programming changes were made.  The LVAD is functioning within specified parameters.  The patient performs LVAD self-test daily.  LVAD interrogation was negative for any significant power changes, alarms or PI events/speed drops.  LVAD equipment check completed and is in good working order.  Back-up equipment present.   LVAD education done on emergency procedures and precautions and reviewed exit site care.  Length of Stay: 6  Ceilidh Torregrossa MD  12/20/2014, 9:02 PM  VAD Team --- VAD ISSUES ONLY--- Pager [phone] (7am - 7am)  Advanced Heart Failure Team  Pager [phone] (M-F; 7a - 4p)  Please contact [facility] for night-coverage after hours (4p -7a ) and weekends on [email or web address]

## 2014-12-20 NOTE — Transfer of Care (Signed)
Immediate Anesthesia Transfer of Care Note  Patient: [name]  Procedure(s) Performed: Procedure(s) with comments: INSERTION OF IMPLANTABLE LEFT VENTRICULAR ASSIST DEVICE (N/A) - CIRC ARREST  NITRIC OXIDE TRANSESOPHAGEAL ECHOCARDIOGRAM (TEE) (N/A)  Patient Location: SICU  Anesthesia Type:General  Level of Consciousness: sedated, unresponsive and Patient remains intubated per anesthesia plan  Airway & Oxygen Therapy: Patient remains intubated per anesthesia plan and Patient placed on Ventilator (see vital sign flow sheet for setting)  Post-op Assessment: Report given to RN and Post -op Vital signs reviewed and stable  Post vital signs: Reviewed and stable  Last Vitals:  Filed Vitals:   12/20/14 0700  BP: 135/81  Pulse: 42  Temp:   Resp: 26    Complications: No apparent anesthesia complications

## 2014-12-20 NOTE — Progress Notes (Signed)
The patient was examined and preop studies reviewed. There has been no change from the prior exam and the patient is ready for surgery. Plan LVAD implant, Tricuspid ring on A Marcello Moores

## 2014-12-20 NOTE — Op Note (Addendum)
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 Median Sternotomy 2. Extracorporeal circulation 3.   Implantation of HeartMate II Left Ventricular Assist Device. 4.   Clipping of left atrial appendage   Anesthesia:  General Endotracheal   Clinical History/Surgical Indication:  He has NICM with an EF of 20-25% and NYHA class IV symptoms, Intermacs 2 on Milrinone .375 and levophed with IABP in place. He was discussed with Dr. Pilar Plate from [facility] transplant team and felt to be appropriate for Heartmate II LVAD as DT with later transplant evaluation when stable. I discussed the operative procedure with the patient including alternatives, benefits and risks; including but not limited to bleeding, blood transfusion, infection, stroke, myocardial infarction, organ dysfunction, pump malfunction or thrombosis requiring replacement and death. [name] understands and agrees to proceed.    Preparation:  The patient was seen in the preoperative holding area and the correct patient, correct operation were confirmed with the patient after reviewing the medical record and catheterization. The consent was signed by me. Preoperative antibiotics were given. A pulmonary arterial line and radial arterial line were placed by the anesthesia team. The patient was taken back to the operating room and positioned supine on the operating room table. After being placed under general endotracheal anesthesia by the anesthesia team a foley catheter was placed. The neck, chest, abdomen, and both legs were prepped with betadine soap and solution and draped in the usual sterile manner. A surgical time-out was taken and the correct patient and operative procedure were confirmed with the nursing and  anesthesia staff.   Pre-bypass and Post-bypass TEE:   Complete TEE assessment was performed by Dr. Roberts Gaudy. This shows severe LV dysfunction with an EF of 20% with diffuse hypokinesis. The LV dimension was 6.5 cm. There was moderate MR but only trivial central TR around the defibrillator lead.    Creation of pump pocket:   A median sternotomy was performed. The pericardium was opened in the midline. Right ventricular function appeared well preserved with normal PA pressure and low CVP of 5-7.  The ascending aorta was of normal size and had no palpable plaque. There were no contraindications to aortic cannulation or cross-clamping. The left side of the sternum with retracted with the [place]. The left diaphragm was taken down from the anterior chest wall using electrocautery. This was continued laterally as far as possible. A pre-peritoneal plane was developed inferiorly and extended across the mid line toward the right. All blood vessels of any size were clipped and divided. The prototype was placed into the pocket to confirm the right size. Then a 4 cm transverse incision was made to the right of the umbilicus and continued down to the rectus fascia using electrocautery. A skin punch was used to create the drive line exit site about 3 cm below the right costal margin in the anterior axillary line. The straight tunneling device was passed in a subcutaneous plane from the transverse abdominal incision to a point midway to the pericardium and then through the rectus fascia into the pre-peritoneal space. The curved tunneling device was passed from the abdominal incision the the skin exit site.   Cardiopulmonary Bypass:   The patient was fully systemically heparinized and the ACT was maintained > 400 sec. The proximal  aortic arch was cannulated with a 20 F aortic cannula for arterial inflow. Venous cannulation was performed via the right atrial appendage using a two-staged venous cannula. Carbon  dioxide was insufflated into the pericardium at 6L/min throughout the procedure to minimize intracardiac air. The systemic temperature was allowed to drift downward slightly during the procedure and the patient was actively rewarmed.   Clipping of left atrial appendage:  The base of the LAA was measured and a 40 mm Atriclip device was chosen. It was applied at the base of the appendage to completely exclude it.   Aortic outflow graft anastomosis:  The outflow graft was cut to the appropriate length. It was placed inside the bend relief. The ascending aorta was partially occluded with a Lemole clamp. A vertical midline aortotomy was created and the ends enlarged with a 4.5 mm punch. The outflow graft was anastomosed to the aorta using 4-0 prolene pledgetted sutures at the toe and heal that were run down each side. Coseal was used to seal the needle holes in the graft. The Lemole clamp was removed and the graft flushed and clamped with a peripheral Debakey clamp.   Insertion of apical outflow cannula:  The patient was placed on cardiopulmonary bypass. Laparotomy pads were placed behind the heart to aid exposure of the apex. A site was chosen on the anterior wall lateral to the LAD and superior to the true apex. A stab incision was made and a foley catheter placed through the coring device and into the left ventricle. The balloon was inflated and with gentle traction on the catheter the coring device was carefully advanced to create the ventriculotomy. The balloon was deflated and removed. A sump was placed into the LV and the ventricular core excised. Trabeculations that might cause obstruction were excised. Then a series of 2-0 Ethibond horizontal mattress sutures were placed around the ventriculotomy. This took 13 sutures. The sutures were placed through the sewing ring on the outflow sleeve. The sutures were tied and the sleeve appeared to be well-sealed to the apex. Coseal was applied to seal needle  holes. Then volume was left in the heart and the lungs inflated. The HeartMate II was brought onto the operative field. The inflow cannula was inserted into the apical sleeve and the sleeve suture tied. The outflow end of the pump was connected to vent suction. A tie band was placed around the apical sleeve followed by two #5 Ethibond ties. The drive line was then tunneled using the previously placed tunneling devices. A loop was placed in the preperitoneal space. The outflow graft was then attached to the pump. A 20 gauge needle was placed into the outflow graft for deairing. The pump was placed into the pocket.   Weaning from bypass:  The patient was started on Milrinone 0.375 mcg/kg/min, epinephrine 2 mcg/kg/min, and nitric oxide at 20 ppm. The HeartMate II pump was started at 6000 rpm. TEE confirmed that the intracardiac air was removed.  Cardiopulmonary bypass flow was decreased to half flow and the clamp removed from the outflow graft. The speed was increased to 7000 rpm. Flow was decreased to 1/4 and the speed increased to 8000. Cardiopulmonary bypass was discontinued and the speed increased to 8600. Pulmonary artery pressures were normal at 26/16 with a CVP of 9. CO was 6-8 L/min. CPB time was 76 minutes.   Completion:  A bipolar pacing lead was placed on the inferior wall. Heparin was fully reversed with protamine and the aortic and venous cannulas  removed. Hemostasis was achieved. Mediastinal drainage tubes and left pleural chest tubes were placed with a drain in the pocket.  The sternum was closed with #6 stainless steel wires. The fascia was closed with continuous # 1 vicryl suture with interrupted # 1 vicryl sutures in the lower incisional fascia. The subcutaneous tissue was closed with 2-0 vicryl continuous suture. The skin was closed with 3-0 vicryl subcuticular suture. The abdominal incision was closed with continuous 3-0 vicryl subcutaneous suture and 3-0 vicryl subcuticular skin suture.  The drive line exit site was re-approximated with 3-0 vicryl subcutaneous sutures and 3-0 nylon skin sutures. The drive line fastener was applied. All sponge, needle, and instrument counts were reported correct at the end of the case. Dry sterile dressings were placed over the incisions and around the chest tubes which were connected to pleurevac suction. The patient was then transported to the surgical intensive care unit in critical but stable condition.

## 2014-12-20 NOTE — Brief Op Note (Signed)
12/14/2014 - 12/20/2014  2:46 PM  PATIENT:  [name]  44 y.o. male  PRE-OPERATIVE DIAGNOSIS:  ICM  POST-OPERATIVE DIAGNOSIS:  ICM  PROCEDURE:  Procedure(s) with comments: INSERTION OF IMPLANTABLE LEFT VENTRICULAR ASSIST DEVICE  TRANSESOPHAGEAL ECHOCARDIOGRAM (TEE) (N/A)  SURGEON:  Surgeon(s) and Role:    * Ivin Poot, MD - Assiting    * Gaye Pollack, MD - Primary  PHYSICIAN ASSISTANT: none  ASSISTANTS: Ara Kussmaul, RNFA   ANESTHESIA:   general  EBL:  Total I/O In: [id number] [I.V.:1700; Blood:2055; IV [id number] Out: [id number] [Urine:1950; Blood:1500]  BLOOD ADMINISTERED:584 cc FFP and 371 cc PLTS  DRAINS: 2 mediastinal Chest Tube(s) in the anterior mediastinum and posterior pericardium and 1 left pleural tube and 1 pocket drain.   LOCAL MEDICATIONS USED:  NONE  SPECIMEN:  Source of Specimen:  myocardial plug  DISPOSITION OF SPECIMEN:  PATHOLOGY  COUNTS:  YES  TOURNIQUET:  * No tourniquets in log *  DICTATION: .Note written in EPIC  PLAN OF CARE: Admit to inpatient   PATIENT DISPOSITION:  ICU - intubated but hemodynamically stable   Delay start of Pharmacological VTE agent (>24hrs) due to surgical blood loss or risk of bleeding: yes

## 2014-12-21 ENCOUNTER — Encounter (HOSPITAL_COMMUNITY): Payer: Self-pay | Admitting: Cardiothoracic Surgery

## 2014-12-21 ENCOUNTER — Inpatient Hospital Stay (HOSPITAL_COMMUNITY): Payer: 59

## 2014-12-21 DIAGNOSIS — Z95811 Presence of heart assist device: Secondary | ICD-10-CM

## 2014-12-21 LAB — CBC
HCT: 25.5 % — ABNORMAL LOW (ref 39.0–52.0)
HCT: 28.8 % — ABNORMAL LOW (ref 39.0–52.0)
HEMOGLOBIN: 9.4 g/dL — AB (ref 13.0–17.0)
Hemoglobin: 8.3 g/dL — ABNORMAL LOW (ref 13.0–17.0)
MCH: 21.4 pg — ABNORMAL LOW (ref 26.0–34.0)
MCH: 21.5 pg — AB (ref 26.0–34.0)
MCHC: 32.5 g/dL (ref 30.0–36.0)
MCHC: 32.6 g/dL (ref 30.0–36.0)
MCV: 65.9 fL — ABNORMAL LOW (ref 78.0–100.0)
MCV: 65.9 fL — AB (ref 78.0–100.0)
PLATELETS: 145 10*3/uL — AB (ref 150–400)
Platelets: 121 10*3/uL — ABNORMAL LOW (ref 150–400)
RBC: 3.87 MIL/uL — ABNORMAL LOW (ref 4.22–5.81)
RBC: 4.37 MIL/uL (ref 4.22–5.81)
RDW: 16.6 % — ABNORMAL HIGH (ref 11.5–15.5)
RDW: 16.7 % — ABNORMAL HIGH (ref 11.5–15.5)
WBC: 9.5 10*3/uL (ref 4.0–10.5)
WBC: 14.5 10*3/uL — ABNORMAL HIGH (ref 4.0–10.5)

## 2014-12-21 LAB — GLUCOSE, CAPILLARY
GLUCOSE-CAPILLARY: 131 mg/dL — AB (ref 65–99)
Glucose-Capillary: 106 mg/dL — ABNORMAL HIGH (ref 65–99)
Glucose-Capillary: 110 mg/dL — ABNORMAL HIGH (ref 65–99)
Glucose-Capillary: 138 mg/dL — ABNORMAL HIGH (ref 65–99)
Glucose-Capillary: 154 mg/dL — ABNORMAL HIGH (ref 65–99)

## 2014-12-21 LAB — PREPARE FRESH FROZEN PLASMA
UNIT DIVISION: 0
Unit division: 0
Unit division: 0

## 2014-12-21 LAB — CBC WITH DIFFERENTIAL/PLATELET
BASOS ABS: 0 10*3/uL (ref 0.0–0.1)
Basophils Relative: 0 % (ref 0–1)
EOS ABS: 0.1 10*3/uL (ref 0.0–0.7)
Eosinophils Relative: 1 % (ref 0–5)
HEMATOCRIT: 24.8 % — AB (ref 39.0–52.0)
LYMPHS ABS: 1.1 10*3/uL (ref 0.7–4.0)
LYMPHS PCT: 13 % (ref 12–46)
MCH: 21.8 pg — ABNORMAL LOW (ref 26.0–34.0)
MCHC: 33.1 g/dL (ref 30.0–36.0)
MCV: 65.8 fL — ABNORMAL LOW (ref 78.0–100.0)
MONOS PCT: 11 % (ref 3–12)
Monocytes Absolute: 0.9 10*3/uL (ref 0.1–1.0)
Neutro Abs: 6.4 10*3/uL (ref 1.7–7.7)
Neutrophils Relative %: 75 % (ref 43–77)
PLATELETS: 117 10*3/uL — AB (ref 150–400)
RBC: 3.77 MIL/uL — AB (ref 4.22–5.81)
RDW: 16.7 % — AB (ref 11.5–15.5)
WBC: 8.5 10*3/uL (ref 4.0–10.5)

## 2014-12-21 LAB — PREPARE PLATELET PHERESIS: UNIT DIVISION: 0

## 2014-12-21 LAB — POCT I-STAT 3, ART BLOOD GAS (G3+)
ACID-BASE DEFICIT: 2 mmol/L (ref 0.0–2.0)
ACID-BASE DEFICIT: 2 mmol/L (ref 0.0–2.0)
Acid-base deficit: 2 mmol/L (ref 0.0–2.0)
Acid-base deficit: 2 mmol/L (ref 0.0–2.0)
Acid-base deficit: 3 mmol/L — ABNORMAL HIGH (ref 0.0–2.0)
BICARBONATE: 22.1 meq/L (ref 20.0–24.0)
Bicarbonate: 22.1 mEq/L (ref 20.0–24.0)
Bicarbonate: 22.1 mEq/L (ref 20.0–24.0)
Bicarbonate: 22.3 mEq/L (ref 20.0–24.0)
Bicarbonate: 21.7 mEq/L (ref 20.0–24.0)
O2 SAT: 99 %
O2 SAT: 99 %
O2 SAT: 99 %
O2 Saturation: 99 %
O2 Saturation: 98 %
PCO2 ART: 36.5 mmHg (ref 35.0–45.0)
PCO2 ART: 38 mmHg (ref 35.0–45.0)
PCO2 ART: 38.3 mmHg (ref 35.0–45.0)
PH ART: 7.368 (ref 7.350–7.450)
Patient temperature: 38.1
Patient temperature: 38.3
TCO2: 23 mmol/L (ref 0–100)
TCO2: 23 mmol/L (ref 0–100)
TCO2: 23 mmol/L (ref 0–100)
TCO2: 23 mmol/L (ref 0–100)
TCO2: 23 mmol/L (ref 0–100)
pCO2 arterial: 37.3 mmHg (ref 35.0–45.0)
pCO2 arterial: 38.1 mmHg (ref 35.0–45.0)
pH, Arterial: 7.386 (ref 7.350–7.450)
pH, Arterial: 7.395 (ref 7.350–7.450)
pH, Arterial: 7.378 (ref 7.350–7.450)
pH, Arterial: 7.382 (ref 7.350–7.450)
pO2, Arterial: 135 mmHg — ABNORMAL HIGH (ref 80.0–100.0)
pO2, Arterial: 147 mmHg — ABNORMAL HIGH (ref 80.0–100.0)
pO2, Arterial: 150 mmHg — ABNORMAL HIGH (ref 80.0–100.0)
pO2, Arterial: 160 mmHg — ABNORMAL HIGH (ref 80.0–100.0)
pO2, Arterial: 109 mmHg — ABNORMAL HIGH (ref 80.0–100.0)

## 2014-12-21 LAB — COMPREHENSIVE METABOLIC PANEL
ALT: 18 U/L (ref 17–63)
ANION GAP: 8 (ref 5–15)
AST: 52 U/L — ABNORMAL HIGH (ref 15–41)
Albumin: 3.3 g/dL — ABNORMAL LOW (ref 3.5–5.0)
Alkaline Phosphatase: 43 U/L (ref 38–126)
BILIRUBIN TOTAL: 3.1 mg/dL — AB (ref 0.3–1.2)
BUN: 15 mg/dL (ref 6–20)
CALCIUM: 8 mg/dL — AB (ref 8.9–10.3)
CO2: 21 mmol/L — ABNORMAL LOW (ref 22–32)
Chloride: 104 mmol/L (ref 101–111)
Creatinine, Ser: 1.18 mg/dL (ref 0.61–1.24)
GFR calc Af Amer: >60 mL/min (ref >60)
Glucose, Bld: 107 mg/dL — ABNORMAL HIGH (ref 65–99)
POTASSIUM: 4.4 mmol/L (ref 3.5–5.1)
Sodium: 133 mmol/L — ABNORMAL LOW (ref 135–145)
TOTAL PROTEIN: 5.1 g/dL — AB (ref 6.5–8.1)

## 2014-12-21 LAB — POCT I-STAT, CHEM 8
BUN: 23 mg/dL — ABNORMAL HIGH (ref 6–20)
BUN: 17 mg/dL (ref 6–20)
CALCIUM ION: 1.2 mmol/L (ref 1.12–1.23)
CHLORIDE: 102 mmol/L (ref 101–111)
CREATININE: 1.1 mg/dL (ref 0.61–1.24)
Calcium, Ion: 1.2 mmol/L (ref 1.12–1.23)
Chloride: 70 mmol/L — ABNORMAL LOW (ref 101–111)
Creatinine, Ser: 1.2 mg/dL (ref 0.61–1.24)
Glucose, Bld: 141 mg/dL — ABNORMAL HIGH (ref 65–99)
Glucose, Bld: 145 mg/dL — ABNORMAL HIGH (ref 65–99)
HCT: 35 % — ABNORMAL LOW (ref 39.0–52.0)
HCT: 35 % — ABNORMAL LOW (ref 39.0–52.0)
HEMOGLOBIN: 11.9 g/dL — AB (ref 13.0–17.0)
HEMOGLOBIN: 11.9 g/dL — AB (ref 13.0–17.0)
POTASSIUM: 4.7 mmol/L (ref 3.5–5.1)
Potassium: 4.7 mmol/L (ref 3.5–5.1)
SODIUM: 134 mmol/L — AB (ref 135–145)
SODIUM: 134 mmol/L — AB (ref 135–145)
TCO2: 22 mmol/L (ref 0–100)
TCO2: 19 mmol/L (ref 0–100)

## 2014-12-21 LAB — PROTIME-INR
INR: 1.54 — AB (ref 0.00–1.49)
PROTHROMBIN TIME: 18.5 s — AB (ref 11.6–15.2)

## 2014-12-21 LAB — CARBOXYHEMOGLOBIN
CARBOXYHEMOGLOBIN: 1.1 % (ref 0.5–1.5)
METHEMOGLOBIN: 1.1 % (ref 0.0–1.5)
O2 Saturation: 59.5 %
TOTAL HEMOGLOBIN: 10.4 g/dL — AB (ref 13.5–18.0)

## 2014-12-21 LAB — MAGNESIUM
MAGNESIUM: 2 mg/dL (ref 1.7–2.4)
Magnesium: 2 mg/dL (ref 1.7–2.4)

## 2014-12-21 LAB — BRAIN NATRIURETIC PEPTIDE: B NATRIURETIC PEPTIDE 5: 347 pg/mL — AB (ref 0.0–100.0)

## 2014-12-21 LAB — CREATININE, SERUM
CREATININE: 1.29 mg/dL — AB (ref 0.61–1.24)
GFR calc Af Amer: >60 mL/min (ref >60)
GFR calc non Af Amer: >60 mL/min (ref >60)

## 2014-12-21 LAB — FACTOR 5 LEIDEN

## 2014-12-21 LAB — LACTATE DEHYDROGENASE: LDH: 220 U/L — ABNORMAL HIGH (ref 98–192)

## 2014-12-21 LAB — PHOSPHORUS: PHOSPHORUS: 4.5 mg/dL (ref 2.5–4.6)

## 2014-12-21 LAB — CALCIUM, IONIZED: Calcium, Ionized, Serum: 5 mg/dL (ref 4.5–5.6)

## 2014-12-21 MED ORDER — METOPROLOL TARTRATE 1 MG/ML IV SOLN
2.5000 mg | Freq: Once | INTRAVENOUS | Status: AC
  Administered 2014-12-21: 2.5 mg via INTRAVENOUS

## 2014-12-21 MED ORDER — DIGOXIN 0.25 MG/ML IJ SOLN
0.2500 mg | Freq: Once | INTRAMUSCULAR | Status: AC
  Administered 2014-12-21: 0.25 mg via INTRAVENOUS
  Filled 2014-12-21: qty 1

## 2014-12-21 MED ORDER — FUROSEMIDE 8 MG/ML PO SOLN
40.0000 mg | Freq: Once | ORAL | Status: DC
  Filled 2014-12-21: qty 5

## 2014-12-21 MED ORDER — MIDAZOLAM HCL 2 MG/2ML IJ SOLN
1.0000 mg | Freq: Once | INTRAMUSCULAR | Status: AC
  Administered 2014-12-21: 0.5 mg via INTRAVENOUS

## 2014-12-21 MED ORDER — ALBUMIN HUMAN 5 % IV SOLN
250.0000 mL | INTRAVENOUS | Status: DC | PRN
  Administered 2014-12-21 (×2): 250 mL via INTRAVENOUS

## 2014-12-21 MED ORDER — ALBUMIN HUMAN 5 % IV SOLN
INTRAVENOUS | Status: AC
  Administered 2014-12-21: 250 mL via INTRAVENOUS
  Filled 2014-12-21: qty 250

## 2014-12-21 MED ORDER — FUROSEMIDE 10 MG/ML IJ SOLN
40.0000 mg | Freq: Once | INTRAMUSCULAR | Status: AC
  Administered 2014-12-21: 40 mg via INTRAVENOUS

## 2014-12-21 MED ORDER — AMIODARONE LOAD VIA INFUSION
150.0000 mg | Freq: Once | INTRAVENOUS | Status: AC
  Administered 2014-12-21: 150 mg via INTRAVENOUS
  Filled 2014-12-21: qty 83.34

## 2014-12-21 MED ORDER — FUROSEMIDE 40 MG PO TABS: 40.0000 mg | ORAL_TABLET | Freq: Once | ORAL | Status: DC

## 2014-12-21 MED ORDER — AMIODARONE IV BOLUS ONLY 150 MG/100ML: 150.0000 mg | Freq: Once | INTRAVENOUS | Status: DC

## 2014-12-21 MED ORDER — DIGOXIN 0.25 MG/ML IJ SOLN
0.5000 mg | Freq: Once | INTRAMUSCULAR | Status: AC
  Administered 2014-12-21: 0.5 mg via INTRAVENOUS

## 2014-12-21 MED ORDER — FENTANYL CITRATE (PF) 100 MCG/2ML IJ SOLN
25.0000 ug | Freq: Once | INTRAMUSCULAR | Status: AC
  Administered 2014-12-21: 25 ug via INTRAVENOUS

## 2014-12-21 MED ORDER — FENTANYL CITRATE (PF) 100 MCG/2ML IJ SOLN
INTRAMUSCULAR | Status: AC
  Filled 2014-12-21: qty 2

## 2014-12-21 MED ORDER — FUROSEMIDE 10 MG/ML IJ SOLN
40.0000 mg | Freq: Four times a day (QID) | INTRAMUSCULAR | Status: DC
  Administered 2014-12-21 – 2014-12-22 (×3): 40 mg via INTRAVENOUS
  Filled 2014-12-21 (×2): qty 4

## 2014-12-21 MED ORDER — NOREPINEPHRINE BITARTRATE 1 MG/ML IV SOLN
4.0000 ug/min | INTRAVENOUS | Status: DC
  Administered 2014-12-21: 3 ug/min via INTRAVENOUS
  Administered 2014-12-22: 4 ug/min via INTRAVENOUS
  Filled 2014-12-21 (×2): qty 16

## 2014-12-21 MED ORDER — INSULIN ASPART 100 UNIT/ML ~~LOC~~ SOLN
0.0000 [IU] | SUBCUTANEOUS | Status: DC
  Administered 2014-12-21 – 2014-12-22 (×7): 2 [IU] via SUBCUTANEOUS
  Administered 2014-12-22: 4 [IU] via SUBCUTANEOUS
  Administered 2014-12-22 – 2014-12-24 (×5): 2 [IU] via SUBCUTANEOUS

## 2014-12-21 MED FILL — Sodium Chloride IV Soln 0.9%: INTRAVENOUS | Qty: 250 | Status: AC

## 2014-12-21 MED FILL — Vasopressin Inj 20 Unit/ML: INTRAMUSCULAR | Qty: 3 | Status: AC

## 2014-12-21 MED FILL — Dextrose Inj 5%: INTRAVENOUS | Qty: 250 | Status: AC

## 2014-12-21 MED FILL — Phenylephrine HCl Inj 10 MG/ML: INTRAMUSCULAR | Qty: 2 | Status: AC

## 2014-12-21 MED FILL — Magnesium Sulfate Inj 50%: INTRAMUSCULAR | Qty: 10 | Status: AC

## 2014-12-21 MED FILL — Potassium Chloride Inj 2 mEq/ML: INTRAVENOUS | Qty: 40 | Status: AC

## 2014-12-21 MED FILL — Heparin Sodium (Porcine) Inj 1000 Unit/ML: INTRAMUSCULAR | Qty: 30 | Status: AC

## 2014-12-21 NOTE — Progress Notes (Signed)
Patient ID: ROBERTA FARLEIGH, male   DOB: 1970-04-28, 44 y.o.   MRN: [id number]  HeartMate 2 Rounding Note  Subjective:   Developed rapid atrial flutter during the weaning process with ventricular rate up to 190. He received additional 2 boluses of amio and drip increased to 60 mg. Given digoxin 0.5 followed by 0.25.  Extubated at 11:20.   HR still 130's and looks regular like atrial flutter.  Nurses stood him with difficulty   He is on milrinone 0.375, epi 4 mcg, leveophed 1 mcg, and amio 60 CI 2.0, CVP 19 this afternoon but was 10-12 this am. CO-ox 60% this am.   LVAD INTERROGATION:  HeartMate II LVAD:  Flow 3.3 liters/min, speed 8600, power 4.0, PI 6.5.    Objective:    Vital Signs:   Temp:  [98.2 F (36.8 C)-101.7 F (38.7 C)] 100.8 F (38.2 C) (09/02 1515) Pulse Rate:  [25-189] 32 (09/02 1515) Resp:  [12-41] 29 (09/02 1515) BP: (78-167)/(71-99) 108/92 mmHg (09/02 1500) SpO2:  [99 %-100 %] 100 % (09/02 1515) Arterial Line BP: (67-172)/(54-143) 107/92 mmHg (09/02 1515) FiO2 (%):  [40 %-50 %] 40 % (09/02 1047) Weight:  [85.2 kg (187 lb 13.3 oz)] 85.2 kg (187 lb 13.3 oz) (09/02 0400) Last BM Date: 12/18/14 Mean arterial Pressure 90's  Intake/Output:   Intake/Output Summary (Last 24 hours) at 12/21/14 1603 Last data filed at 12/21/14 1500  Gross per 24 hour  Intake 5662.96 ml  Output   4150 ml  Net 1512.96 ml     Physical Exam: General:  Well appearing. No resp difficulty HEENT: normal Cor: distant heart sounds with LVAD hum present. Lungs: clear Abdomen: soft, nontender, nondistended. No hepatosplenomegaly. No bruits or masses. hypoactive bowel sounds. Extremities: no cyanosis, clubbing, rash, moderate edema Neuro: alert & orientedx3, cranial nerves grossly intact. moves all 4 extremities w/o difficulty. Affect pleasant  Telemetry: atrial flutter with RVR 130's  Labs: Basic Metabolic Panel:  Recent Labs Lab 12/16/14 1827  12/17/14 0515 12/18/14 0525  12/19/14 0540 12/20/14 0350  12/20/14 1228  12/20/14 1435 12/20/14 1530 12/20/14 2005 12/20/14 2014 12/20/14 2020 12/21/14 0308  NA  --   < > 128* 127* 129* 126*  < > 129*  < > 133* 132*  --  134* 134* 133*  K  --   < > 4.0 4.6 4.9 4.8  < > 4.1  < > 3.9 4.4  --  4.7 4.7 4.4  CL  --   < > 94* 93* 96* 92*  < > 94*  --   --  102  --  70* 100* 104  CO2  --   < > 24 24 23 23   --   --   --   --  23  --   --   --  21*  GLUCOSE  --   < > 269* 232* 176* 176*  < > 132*  --  116* 124*  --  141* 135* 107*  BUN  --   < > 24* 21* 21* 24*  < > 25*  --   --  21*  --  23* 21* 15  CREATININE  --   < > 1.48* 1.40* 1.43* 1.41*  < > 1.20  --   --  1.27* 1.29* 1.10 1.20 1.18  CALCIUM  --   < > 8.4* 8.6* 9.1 9.0  --   --   --   --  8.4*  --   --   --  8.0*  MG 2.1  --  1.8  --   --   --   --   --   --   --  2.3 2.5*  --   --  2.0  PHOS  --   --   --   --   --   --   --   --   --   --   --   --   --   --  4.5  < > = values in this interval not displayed.  Liver Function Tests:  Recent Labs Lab 12/18/14 0525 12/21/14 0308  AST 21 52*  ALT 24 18  ALKPHOS 99 43  BILITOT 1.4* 3.1*  PROT 7.4 5.1*  ALBUMIN 2.9* 3.3*   No results for input(s): LIPASE, AMYLASE in the last 168 hours. No results for input(s): AMMONIA in the last 168 hours.  CBC:  Recent Labs Lab 12/20/14 1459 12/20/14 1705 12/20/14 2005 12/20/14 2014 12/20/14 2020 12/21/14 0308 12/21/14 0750  WBC 10.4 10.3 9.4  --   --  8.5 9.5  NEUTROABS  --   --  7.3  --   --  6.4  --   HGB 9.9* 9.4* 9.3* 11.9* 11.2* REPEATED TO VERIFY 8.3*  HCT 30.5* 29.2* 28.4* 35.0* 33.0* 24.8* 25.5*  MCV 64.8* 65.0* 65.4*  --   --  65.8* 65.9*  PLT 152 131* 134*  --   --  117* 121*    INR:  Recent Labs Lab 12/20/14 1448 12/20/14 1459 12/21/14 0308  INR 1.36 1.49 1.54*    Other results:  EKG:   Imaging: Dg Chest Port 1 View  12/21/2014   CLINICAL DATA:  Status post placement of a left ventricular assist device 12/20/2014.  EXAM: PORTABLE  CHEST - 1 VIEW  COMPARISON:  Plain film of the chest 12/20/2014  FINDINGS: Support tubes and lines are unchanged. Swan-Ganz catheter appears to have been pulled back slightly with the tip in the proximal right descending interlobar pulmonary artery. No pneumothorax is identified. Basilar airspace opacities appear increased most consistent with atelectasis. No pneumothorax.  IMPRESSION: Increased left worse than right basilar atelectasis. The patient's examination is otherwise unchanged.   Electronically Signed   By: Inge Rise M.D.   On: 12/21/2014 08:00   Dg Chest Port 1 View  12/20/2014   CLINICAL DATA:  Status post placement of a left ventricular assist device today.  EXAM: PORTABLE CHEST - 1 VIEW  COMPARISON:  Single view of the chest 12/18/2014.  FINDINGS: There is partial visualization of a new left ventricular assist device. Two mediastinal drains and a left chest tube are in place. No pneumothorax is identified. The patient also has a new a right IJ approach Swan-Ganz catheter in place with the tip of the Swan in the descending right interlobar pulmonary artery. The catheter could be pulled back approximately 6 cm for positioning in the right main pulmonary artery. NG tube courses into the stomach with the tip below the inferior margin of film. Endotracheal tube tip is at the level of clavicular heads in good position. AICD is again noted.  Mild left basilar atelectasis is noted. The right lung is clear. No pleural effusion is identified. Cardiomegaly is seen as on the prior exam.  IMPRESSION: Swan-Ganz catheter tip projects in the descending right interlobar pulmonary artery. The line could be pulled back 6 cm for positioning in the right main pulmonary artery. Support apparatus is otherwise unremarkable. No pneumothorax.  Cardiomegaly without  edema.   Electronically Signed   By: Inge Rise M.D.   On: 12/20/2014 16:29      Medications:     Scheduled Medications: . acetaminophen  1,000  mg Oral 4 times per day   Or  . acetaminophen (TYLENOL) oral liquid 160 mg/5 mL  1,000 mg Per Tube 4 times per day  . amiodarone  150 mg Intravenous Once  . antiseptic oral rinse  7 mL Mouth Rinse QID  . aspirin EC  325 mg Oral Daily   Or  . aspirin  324 mg Per Tube Daily   Or  . aspirin  300 mg Rectal Daily  . bisacodyl  10 mg Oral Daily   Or  . bisacodyl  10 mg Rectal Daily  . cefUROXime (ZINACEF)  IV  1.5 g Intravenous Q12H  . chlorhexidine  15 mL Mouth/Throat BID  . Chlorhexidine Gluconate Cloth  6 each Topical Q0600  . docusate sodium  200 mg Oral Daily  . insulin aspart  0-24 Units Subcutaneous 6 times per day  . insulin regular  0-10 Units Intravenous TID WC  . metoCLOPramide (REGLAN) injection  10 mg Intravenous 4 times per day  . [START ON 12/22/2014] pantoprazole  40 mg Oral Daily  . sodium chloride  3 mL Intravenous Q12H  . vancomycin  1,000 mg Intravenous Q12H     Infusions: . sodium chloride 20 mL/hr at 12/21/14 1500  . sodium chloride    . sodium chloride 20 mL/hr at 12/21/14 1500  . amiodarone 60 mg/hr (12/21/14 1500)  . dexmedetomidine Stopped (12/21/14 1400)  . epinephrine 4 mcg/min (12/21/14 1500)  . insulin (NOVOLIN-R) infusion Stopped (12/21/14 0000)  . lactated ringers Stopped (12/20/14 2000)  . lactated ringers 20 mL/hr at 12/21/14 1500  . milrinone 0.375 mcg/kg/min (12/21/14 1500)  . nitroGLYCERIN Stopped (12/20/14 1625)  . norepinephrine (LEVOPHED) Adult infusion 1 mcg/min (12/21/14 1500)     PRN Medications:  sodium chloride, lactated ringers, midazolam, morphine injection, ondansetron (ZOFRAN) IV, oxyCODONE, sodium chloride, traMADol   Assessment:   1. Acute/chonic systolic HF , NICM EF 0000000 with Cardiogenic shock on milrinone and levophed, IABP preoop, s/p HMII implant 12/20/2014 2. Chronic atrial fib 3. Postop atrial flutter with RVR 4. Expected EBL anemia 5. Fever early postop   Plan/Discussion:    He is hemodynamically fairly  stable. VAD flow has remained on the low side since insertion in the 3-3.6 range but parameters otherwise ok. Co-ox was 60 this am. Extubated to NO by [place]. Will continue milrinone and epi for now as well as NO.  Atrial flutter with RVR.Continue amiodarone and digoxin. No Cardizem or BB.   Chest tube output ok. Continue all tubes for now.  Fever to 101 but is on antibiotics and lines are new from surgery.  Expected acute blood loss anemia exacerbated by volume infusion. Continue to follow. Avoid transfusion if possible since he may be a transplant candidate.   He probably has some volume excess now. Will start diuretic.   Can probably start anticoagulation tomorrow.      I reviewed the LVAD parameters from today, and compared the results to the patient's prior recorded data.  No programming changes were made.  The LVAD is functioning within specified parameters.  The patient performs LVAD self-test daily.  LVAD interrogation was negative for any significant power changes, alarms or PI events/speed drops.  LVAD equipment check completed and is in good working order.  Back-up equipment present.   LVAD education  done on emergency procedures and precautions and reviewed exit site care.  Length of Stay: [address]  Gaye Pollack 12/21/2014, 4:03 PM

## 2014-12-21 NOTE — Progress Notes (Signed)
RT attempted to wean nitric from 5ppm to 4ppm. Patient did not tolerate. RT will try again in 1 hour.

## 2014-12-21 NOTE — Progress Notes (Signed)
OT Cancellation Note  Patient Details Name: [name] MRN: [id number] DOB: July 04, 1970   Cancelled Treatment:    Reason Eval/Treat Not Completed: Other (comment). Pt is LVAD and order is to start POD #2 which is tomorrow.  [name] [id number] 12/21/2014, 1:00 PM

## 2014-12-21 NOTE — Plan of Care (Signed)
Dr. Cyndia Bent notified of increased HR 150-160's a flutter, orders received.

## 2014-12-21 NOTE — Progress Notes (Signed)
EKG CRITICAL VALUE     12 lead EKG performed.  Critical value noted.  Currie Paris , RN notified.   Saphia Vanderford, CCT 12/21/2014 8:01 AM

## 2014-12-21 NOTE — Progress Notes (Signed)
Mr. Bells was just extubated when I enter the room. He is tachycardic and RN at bedside. He nods head yes to pain and anxiety. RN has just administered pain medication. Reassured him that we are watching him closely and to give the pain medication a few minutes to kick in. Encouraged him to rest his eyes and calm his mind that he is in a safe place. No family at bedside and none found in waiting area.   Vinie Sill, NP Palliative Medicine Team Pager # [phone] (M-F 8a-5p) Team Phone # [phone] (Nights/Weekends)

## 2014-12-21 NOTE — Procedures (Signed)
Extubation Procedure Note  Patient Details:   Name: [name] DOB: 06-07-1970 MRN: [id number]   Airway Documentation:     Evaluation  O2 sats: stable throughout Complications: No apparent complications Patient did tolerate procedure well. Bilateral Breath Sounds: Clear Suctioning: Oral, Airway Yes   Pt. Was extubated with RN at the bedside to a 2L [place] with 2 PPM of nitric. Pt. Achieved a goal of -40 on NIF & 700 on VC. Pt. Was instructed on IS x 10, highest goal reached was 546mL.  [name], [name] 12/21/2014, 11:25 AM

## 2014-12-21 NOTE — Progress Notes (Signed)
Admitted 12/14/14 per Dr. Haroldine Laws due to acute/chonic systolic HF with cardiogenic shock.  HeartMate II LVAD implated on 12/20/14 by Dr. Cyndia Bent as destination therapy VAD.    Vital signs: Temp:  100.9 - 101.5 HR: 75 - 150 Doppler MAP:  77 - 107 Arterial line BP:  79/71 - 137/95 O2 Sat: 98 - 100 Wt in lbs:  169 > 187   Intra-op blood products: 4FFP 1 plt 1100 cell saver  Gtts: Milrinone  .375 Epi  4 Amiodarone 30  Nitric Oxide: 3 PPM  Anticoagulants: None at this time due to chest tube output  LVAD interrogation reveals:  Speed:  8600 Flow:  3.3 Power: 4.0 PI:  5.0 Alarms:  none Events:  10 PI events on 12/20/14 Fixed speed:  8600 Low speed limit:  8000  Drive Line:  Gauze dressing intact with attachment device accurately applied; being changed daily  Labs:   Co-ox:  54 > 59 Hgb:  11.2 > 8.3 WBC:  8.5 > 9.5  LDH: 279 > 220 INR:  1.54

## 2014-12-21 NOTE — Progress Notes (Signed)
VAD Team Rounding Note  Subjective:    POD #1 HMII implant.  Remains intubate. Weaning NO and vent.  On milrinone, EPI 4. Low-dose norepi added this am for MAPs in low 70s  CVP 12, PAP 27/15,  Making urine. Flows on VAD remain on low end. Minimal drainage from tubes\  Co-ox 60%. hgb 8.3 Creatinine 1.2   LVAD INTERROGATION:  HeartMate II LVAD:  Flow 33  liters/min, speed 8600, power 4.0,  PI 6.5  Objective:    Vital Signs:   Temp:  [97.7 F (36.5 C)-101.7 F (38.7 C)] 100.6 F (38.1 C) (09/02 1345) Pulse Rate:  [25-189] 146 (09/02 1345) Resp:  [12-34] 29 (09/02 1345) BP: (78-167)/(71-99) 90/79 mmHg (09/02 1300) SpO2:  [99 %-100 %] 100 % (09/02 1345) Arterial Line BP: (67-155)/(54-92) 105/89 mmHg (09/02 1345) FiO2 (%):  [40 %-50 %] 40 % (09/02 1047) Weight:  [187 lb 13.3 oz (85.2 kg)] 187 lb 13.3 oz (85.2 kg) (09/02 0400) Last BM Date: 12/18/14 Mean arterial Pressure 80s  Intake/Output:   Intake/Output Summary (Last 24 hours) at 12/21/14 1355 Last data filed at 12/21/14 1300  Gross per 24 hour  Intake 6148.1 ml  Output   5000 ml  Net 1148.1 ml     Physical Exam: General:  Intubated. sedated HEENT: normal + ETT Neck: supple. JVP RIJ swan  Cor Sternal dressing in place. Multiple CTs: Mechanical heart sounds with LVAD hum present. Lungs:+ rhonchis Abdomen: soft, nontender, +distended. Quiet bowel sounds. Driveline: C/D/I; securement device intact and driveline incorporated Extremities: no cyanosis, clubbing, rash, + edema Neuro: Intubated/sedated  Telemetry: AF 120s  Labs: Basic Metabolic Panel:  Recent Labs Lab 12/16/14 1827  12/17/14 0515 12/18/14 0525 12/19/14 0540 12/20/14 0350  12/20/14 1228  12/20/14 1435 12/20/14 1530 12/20/14 2005 12/20/14 2014 12/20/14 2020 12/21/14 0308  NA  --   < > 128* 127* 129* 126*  < > 129*  < > 133* 132*  --  134* 134* 133*  K  --   < > 4.0 4.6 4.9 4.8  < > 4.1  < > 3.9 4.4  --  4.7 4.7 4.4  CL  --   <  > 94* 93* 96* 92*  < > 94*  --   --  102  --  70* 100* 104  CO2  --   < > 24 24 23 23   --   --   --   --  23  --   --   --  21*  GLUCOSE  --   < > 269* 232* 176* 176*  < > 132*  --  116* 124*  --  141* 135* 107*  BUN  --   < > 24* 21* 21* 24*  < > 25*  --   --  21*  --  23* 21* 15  CREATININE  --   < > 1.48* 1.40* 1.43* 1.41*  < > 1.20  --   --  1.27* 1.29* 1.10 1.20 1.18  CALCIUM  --   < > 8.4* 8.6* 9.1 9.0  --   --   --   --  8.4*  --   --   --  8.0*  MG 2.1  --  1.8  --   --   --   --   --   --   --  2.3 2.5*  --   --  2.0  PHOS  --   --   --   --   --   --   --   --   --   --   --   --   --   --  4.5  < > = values in this interval not displayed.  Liver Function Tests:  Recent Labs Lab 12/14/14 1359 12/18/14 0525 12/21/14 0308  AST 26 21 52*  ALT 36 24 18  ALKPHOS 88 99 43  BILITOT 1.1 1.4* 3.1*  PROT 7.7 7.4 5.1*  ALBUMIN 3.5 2.9* 3.3*   No results for input(s): LIPASE, AMYLASE in the last 168 hours. No results for input(s): AMMONIA in the last 168 hours.  CBC:  Recent Labs Lab 12/14/14 1359  12/20/14 1459 12/20/14 1705 12/20/14 2005 12/20/14 2014 12/20/14 2020 12/21/14 0308 12/21/14 0750  WBC 6.1  < > 10.4 10.3 9.4  --   --  8.5 9.5  NEUTROABS 2.9  --   --   --  7.3  --   --  6.4  --   HGB 13.8  < > 9.9* 9.4* 9.3* 11.9* 11.2* REPEATED TO VERIFY 8.3*  HCT 42.0  < > 30.5* 29.2* 28.4* 35.0* 33.0* 24.8* 25.5*  MCV 67.4*  < > 64.8* 65.0* 65.4*  --   --  65.8* 65.9*  PLT 235  < > 152 131* 134*  --   --  117* 121*  < > = values in this interval not displayed.  INR:  Recent Labs Lab 12/20/14 1448 12/20/14 1459 12/21/14 0308  INR 1.36 1.49 1.54*    Other results:   Imaging: Dg Chest Port 1 View  12/21/2014   CLINICAL DATA:  Status post placement of a left ventricular assist device 12/20/2014.  EXAM: PORTABLE CHEST - 1 VIEW  COMPARISON:  Plain film of the chest 12/20/2014  FINDINGS: Support tubes and lines are unchanged. Swan-Ganz catheter appears to have been  pulled back slightly with the tip in the proximal right descending interlobar pulmonary artery. No pneumothorax is identified. Basilar airspace opacities appear increased most consistent with atelectasis. No pneumothorax.  IMPRESSION: Increased left worse than right basilar atelectasis. The patient's examination is otherwise unchanged.   Electronically Signed   By: Inge Rise M.D.   On: 12/21/2014 08:00   Dg Chest Port 1 View  12/20/2014   CLINICAL DATA:  Status post placement of a left ventricular assist device today.  EXAM: PORTABLE CHEST - 1 VIEW  COMPARISON:  Single view of the chest 12/18/2014.  FINDINGS: There is partial visualization of a new left ventricular assist device. Two mediastinal drains and a left chest tube are in place. No pneumothorax is identified. The patient also has a new a right IJ approach Swan-Ganz catheter in place with the tip of the Swan in the descending right interlobar pulmonary artery. The catheter could be pulled back approximately 6 cm for positioning in the right main pulmonary artery. NG tube courses into the stomach with the tip below the inferior margin of film. Endotracheal tube tip is at the level of clavicular heads in good position. AICD is again noted.  Mild left basilar atelectasis is noted. The right lung is clear. No pleural effusion is identified. Cardiomegaly is seen as on the prior exam.  IMPRESSION: Swan-Ganz catheter tip projects in the descending right interlobar pulmonary artery. The line could be pulled back 6 cm for positioning in the right main pulmonary artery. Support apparatus is otherwise unremarkable. No pneumothorax.  Cardiomegaly without edema.   Electronically Signed   By: Inge Rise M.D.   On: 12/20/2014 16:29     Medications:     Scheduled Medications: . acetaminophen  1,000 mg Oral 4 times per day   Or  .  acetaminophen (TYLENOL) oral liquid 160 mg/5 mL  1,000 mg Per Tube 4 times per day  . amiodarone  150 mg Intravenous  Once  . amiodarone  150 mg Intravenous Once  . antiseptic oral rinse  7 mL Mouth Rinse QID  . aspirin EC  325 mg Oral Daily   Or  . aspirin  324 mg Per Tube Daily   Or  . aspirin  300 mg Rectal Daily  . bisacodyl  10 mg Oral Daily   Or  . bisacodyl  10 mg Rectal Daily  . cefUROXime (ZINACEF)  IV  1.5 g Intravenous Q12H  . chlorhexidine  15 mL Mouth/Throat BID  . Chlorhexidine Gluconate Cloth  6 each Topical Q0600  . docusate sodium  200 mg Oral Daily  . insulin aspart  0-24 Units Subcutaneous 6 times per day  . insulin regular  0-10 Units Intravenous TID WC  . metoCLOPramide (REGLAN) injection  10 mg Intravenous 4 times per day  . [START ON 12/22/2014] pantoprazole  40 mg Oral Daily  . sodium chloride  3 mL Intravenous Q12H  . vancomycin  1,000 mg Intravenous Q12H    Infusions: . sodium chloride 20 mL/hr at 12/21/14 1300  . sodium chloride    . sodium chloride 20 mL/hr at 12/21/14 1300  . amiodarone 60 mg/hr (12/21/14 1300)  . dexmedetomidine 0.1 mcg/kg/hr (12/21/14 1300)  . epinephrine 4 mcg/min (12/21/14 1300)  . insulin (NOVOLIN-R) infusion Stopped (12/21/14 0000)  . lactated ringers Stopped (12/20/14 2000)  . lactated ringers 20 mL/hr at 12/21/14 1300  . milrinone 0.375 mcg/kg/min (12/21/14 1300)  . nitroGLYCERIN Stopped (12/20/14 1625)  . norepinephrine (LEVOPHED) Adult infusion 2.987 mcg/min (12/21/14 1300)    PRN Medications: sodium chloride, lactated ringers, midazolam, morphine injection, ondansetron (ZOFRAN) IV, oxyCODONE, sodium chloride, traMADol   Assessment:   1. Cardiogenic shock s/p HMII implant 12/20/2014 2. Acute/chonic systolic HF   -- NICM EF 0000000 3. Chronic AF 4. Expected EBL anemia 5. Blood Type O+   Plan/Discussion:    Stable POD #1 from HM II VAD placement.   Hemodynamics look good. Flows on VAD remain on low end but ok. Would continue current inotropes and current VAD settings. Keep CVP 12-15 for now. Wean vent today. Has chronic AF  but can use amio for rate control   The patient is critically ill with multiple organ systems failure and requires high complexity decision making for assessment and support, frequent evaluation and titration of therapies, application of advanced monitoring technologies and extensive interpretation of multiple databases.   Critical Care Time devoted to patient care services described in this note is 40 Minutes.   I reviewed the LVAD parameters from today, and compared the results to the patient's prior recorded data.  No programming changes were made.  The LVAD is functioning within specified parameters.  The patient performs LVAD self-test daily.  LVAD interrogation was negative for any significant power changes, alarms or PI events/speed drops.  LVAD equipment check completed and is in good working order.  Back-up equipment present.   LVAD education done on emergency procedures and precautions and reviewed exit site care.  Length of Stay: 7  Lisandro Meggett MD 12/21/2014, 1:55 PM  VAD Team --- VAD ISSUES ONLY--- Pager [phone] (7am - 7am)  Advanced Heart Failure Team  Pager [phone] (M-F; 7a - 4p)  Please contact [facility] for night-coverage after hours (4p -7a ) and weekends on [email or web address]

## 2014-12-21 NOTE — Progress Notes (Signed)
Nutrition Follow-up  DOCUMENTATION CODES:   Not applicable  INTERVENTION:   Advance diet as medically appropriate, add supplements as needed  NUTRITION DIAGNOSIS:   Increased nutrient needs related to  (post op healing) as evidenced by estimated needs, ongoing  GOAL:   Patient will meet greater than or equal to 90% of their needs, currently unmet  MONITOR:   Diet advancement, PO intake, Labs, Weight trends, Skin, I & O's  ASSESSMENT:   LEEANDRE [name] is a 44 y.o. 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  Pt transferred from [facility] to 2S-Surgical ICU 9/1.  S/p procedure 9/1: INSERTION OF IMPLANTABLE LEFT VENTRICULAR ASSIST DEVICE   Pt extubated this AM.  Remains NPO.  Prior to surgery, pt was on a Heart Healthy diet with variable PO intake of 25-50%.  Would benefit from oral nutrition supplements once/as able.  RD to monitor.  Diet Order:  Diet NPO time specified  Skin:  Reviewed, no issues  Last BM:  8/30  Height:   Ht Readings from Last 1 Encounters:  12/20/14 5\' 7"  (1.702 m)    Weight:   Wt Readings from Last 1 Encounters:  12/21/14 187 lb 13.3 oz (85.2 kg)    Ideal Body Weight:  67.3 kg  BMI:  Body mass index is 29.41 kg/(m^2).  Estimated Nutritional Needs:   Kcal:  1800-2000  Protein:  90-100 grams  Fluid:  1.8-2.0 L  EDUCATION NEEDS:   No education needs identified at this time  Arthur Holms, RD, LDN Pager #: [phone] After-Hours Pager #: [phone]

## 2014-12-21 NOTE — Plan of Care (Signed)
Patient on CP/PS, RT trying to get weaning parameters, pt acutely agitated, shaking in bed, Temp, 101.5 core, a fib heart rate up to 150's -180's, Dr. Cyndia Bent   notified, orders received.

## 2014-12-21 NOTE — Plan of Care (Signed)
Dr. Haroldine Laws notified of a fib rate 140's -150's, orders received

## 2014-12-21 NOTE — Care Management Note (Signed)
Case Management Note  Patient Details  Name: [name] MRN: [id number] Date of Birth: 01/28/1971  Subjective/Objective:    Pt lives alone, two sisters live in [place] but are arranging to stay with pt for at least two weeks after he is discharged, longer if needed.  Provided information re home health agencies in [facility], referral made to [place] for their LVAD program as requested.                  Action/Plan: CM will continue to follow          Expected Discharge Plan:  [place]  Discharge planning Services  CM Consult  HH Arranged:  RN [facility] Agency:  [place]  Status of Service:  In process, will continue to follow  Girard Cooter, RN 12/21/2014, 3:02 PM

## 2014-12-22 ENCOUNTER — Inpatient Hospital Stay (HOSPITAL_COMMUNITY): Payer: 59

## 2014-12-22 LAB — BASIC METABOLIC PANEL
Anion gap: 12 (ref 5–15)
BUN: 26 mg/dL — AB (ref 6–20)
CHLORIDE: 97 mmol/L — AB (ref 101–111)
CO2: 23 mmol/L (ref 22–32)
Calcium: 9.1 mg/dL (ref 8.9–10.3)
Creatinine, Ser: 1.91 mg/dL — ABNORMAL HIGH (ref 0.61–1.24)
GFR calc Af Amer: 47 mL/min — ABNORMAL LOW (ref >60)
GFR calc non Af Amer: 40 mL/min — ABNORMAL LOW (ref >60)
GLUCOSE: 142 mg/dL — AB (ref 65–99)
POTASSIUM: 4.2 mmol/L (ref 3.5–5.1)
Sodium: 132 mmol/L — ABNORMAL LOW (ref 135–145)

## 2014-12-22 LAB — BLOOD GAS, ARTERIAL
Acid-base deficit: 4 mmol/L — ABNORMAL HIGH (ref 0.0–2.0)
BICARBONATE: 20.5 meq/L (ref 20.0–24.0)
DRAWN BY: 25203
Nitric Oxide: 4
O2 CONTENT: 4 L/min
O2 Saturation: 95.7 %
PATIENT TEMPERATURE: 98.6
PCO2 ART: 37 mmHg (ref 35.0–45.0)
PH ART: 7.362 (ref 7.350–7.450)
PO2 ART: 87 mmHg (ref 80.0–100.0)
TCO2: 21.7 mmol/L (ref 0–100)

## 2014-12-22 LAB — CARBOXYHEMOGLOBIN
Carboxyhemoglobin: 1.3 % (ref 0.5–1.5)
Carboxyhemoglobin: 1 % (ref 0.5–1.5)
METHEMOGLOBIN: 1 % (ref 0.0–1.5)
Methemoglobin: 0.9 % (ref 0.0–1.5)
O2 Saturation: 52 %
O2 Saturation: 67.3 %
Total hemoglobin: 9.3 g/dL — ABNORMAL LOW (ref 13.5–18.0)
Total hemoglobin: 9.4 g/dL — ABNORMAL LOW (ref 13.5–18.0)

## 2014-12-22 LAB — GLUCOSE, CAPILLARY
GLUCOSE-CAPILLARY: 150 mg/dL — AB (ref 65–99)
GLUCOSE-CAPILLARY: 127 mg/dL — AB (ref 65–99)
GLUCOSE-CAPILLARY: 119 mg/dL — AB (ref 65–99)
GLUCOSE-CAPILLARY: 128 mg/dL — AB (ref 65–99)
Glucose-Capillary: 168 mg/dL — ABNORMAL HIGH (ref 65–99)
Glucose-Capillary: 115 mg/dL — ABNORMAL HIGH (ref 65–99)

## 2014-12-22 LAB — DIGOXIN LEVEL: DIGOXIN LVL: 1.8 ng/mL (ref 0.8–2.0)

## 2014-12-22 LAB — POCT I-STAT, CHEM 8
BUN: 27 mg/dL — ABNORMAL HIGH (ref 6–20)
CREATININE: 1.8 mg/dL — AB (ref 0.61–1.24)
Calcium, Ion: 1.23 mmol/L (ref 1.12–1.23)
Chloride: 99 mmol/L — ABNORMAL LOW (ref 101–111)
GLUCOSE: 123 mg/dL — AB (ref 65–99)
HCT: 35 % — ABNORMAL LOW (ref 39.0–52.0)
HEMOGLOBIN: 11.9 g/dL — AB (ref 13.0–17.0)
Potassium: 4.7 mmol/L (ref 3.5–5.1)
Sodium: 132 mmol/L — ABNORMAL LOW (ref 135–145)
TCO2: 22 mmol/L (ref 0–100)

## 2014-12-22 LAB — CBC WITH DIFFERENTIAL/PLATELET
BASOS PCT: 0 % (ref 0–1)
Basophils Absolute: 0 10*3/uL (ref 0.0–0.1)
EOS PCT: 0 % (ref 0–5)
Eosinophils Absolute: 0 10*3/uL (ref 0.0–0.7)
HEMATOCRIT: 28.6 % — AB (ref 39.0–52.0)
HEMOGLOBIN: 9.5 g/dL — AB (ref 13.0–17.0)
LYMPHS ABS: 1.5 10*3/uL (ref 0.7–4.0)
Lymphocytes Relative: 8 % — ABNORMAL LOW (ref 12–46)
MCH: 21.5 pg — ABNORMAL LOW (ref 26.0–34.0)
MCHC: 33.2 g/dL (ref 30.0–36.0)
MCV: 64.7 fL — AB (ref 78.0–100.0)
MONO ABS: 2.9 10*3/uL — AB (ref 0.1–1.0)
MONOS PCT: 15 % — AB (ref 3–12)
Neutro Abs: 14.7 10*3/uL — ABNORMAL HIGH (ref 1.7–7.7)
Neutrophils Relative %: 77 % (ref 43–77)
Platelets: 168 10*3/uL (ref 150–400)
RBC: 4.42 MIL/uL (ref 4.22–5.81)
RDW: 16.3 % — AB (ref 11.5–15.5)
WBC: 19.1 10*3/uL — ABNORMAL HIGH (ref 4.0–10.5)

## 2014-12-22 LAB — COMPREHENSIVE METABOLIC PANEL
ALBUMIN: 3.2 g/dL — AB (ref 3.5–5.0)
ALT: 20 U/L (ref 17–63)
AST: 61 U/L — AB (ref 15–41)
Alkaline Phosphatase: 63 U/L (ref 38–126)
Anion gap: 11 (ref 5–15)
BUN: 19 mg/dL (ref 6–20)
CHLORIDE: 99 mmol/L — AB (ref 101–111)
CO2: 22 mmol/L (ref 22–32)
CREATININE: 1.58 mg/dL — AB (ref 0.61–1.24)
Calcium: 8.8 mg/dL — ABNORMAL LOW (ref 8.9–10.3)
GFR calc Af Amer: 59 mL/min — ABNORMAL LOW (ref >60)
GFR calc non Af Amer: 51 mL/min — ABNORMAL LOW (ref >60)
Glucose, Bld: 124 mg/dL — ABNORMAL HIGH (ref 65–99)
POTASSIUM: 5 mmol/L (ref 3.5–5.1)
SODIUM: 132 mmol/L — AB (ref 135–145)
Total Bilirubin: 3.4 mg/dL — ABNORMAL HIGH (ref 0.3–1.2)
Total Protein: 6.2 g/dL — ABNORMAL LOW (ref 6.5–8.1)

## 2014-12-22 LAB — PROTIME-INR
INR: 1.53 — AB (ref 0.00–1.49)
Prothrombin Time: 18.5 seconds — ABNORMAL HIGH (ref 11.6–15.2)

## 2014-12-22 LAB — LACTATE DEHYDROGENASE: LDH: 305 U/L — AB (ref 98–192)

## 2014-12-22 LAB — PHOSPHORUS: PHOSPHORUS: 5.4 mg/dL — AB (ref 2.5–4.6)

## 2014-12-22 LAB — MAGNESIUM: Magnesium: 2.1 mg/dL (ref 1.7–2.4)

## 2014-12-22 MED ORDER — HYDRALAZINE HCL 20 MG/ML IJ SOLN
INTRAMUSCULAR | Status: AC
  Administered 2014-12-22: 10 mg via INTRAVENOUS
  Filled 2014-12-22: qty 1

## 2014-12-22 MED ORDER — MIDAZOLAM HCL 2 MG/2ML IJ SOLN: 1.0000 mg | INTRAMUSCULAR | Status: DC | PRN

## 2014-12-22 MED ORDER — DEXTROSE 5 % IV SOLN
10.0000 mg/h | INTRAVENOUS | Status: DC
  Administered 2014-12-24: 15 mg/h via INTRAVENOUS
  Administered 2014-12-25: 10 mg/h via INTRAVENOUS
  Filled 2014-12-22 (×7): qty 25

## 2014-12-22 MED ORDER — PIPERACILLIN-TAZOBACTAM 3.375 G IVPB
3.3750 g | Freq: Three times a day (TID) | INTRAVENOUS | Status: AC
  Administered 2014-12-22 – 2014-12-29 (×21): 3.375 g via INTRAVENOUS
  Filled 2014-12-22 (×24): qty 50

## 2014-12-22 MED ORDER — FUROSEMIDE 10 MG/ML IJ SOLN: 40.0000 mg | Freq: Every day | INTRAMUSCULAR | Status: DC

## 2014-12-22 MED ORDER — FUROSEMIDE 10 MG/ML IJ SOLN
8.0000 mg/h | INTRAMUSCULAR | Status: DC
  Administered 2014-12-22: 8 mg/h via INTRAVENOUS
  Filled 2014-12-22: qty 25

## 2014-12-22 MED ORDER — VANCOMYCIN HCL IN DEXTROSE 1-5 GM/200ML-% IV SOLN
1000.0000 mg | Freq: Two times a day (BID) | INTRAVENOUS | Status: DC
  Administered 2014-12-22: 1000 mg via INTRAVENOUS
  Filled 2014-12-22 (×2): qty 200

## 2014-12-22 MED ORDER — METOLAZONE 5 MG PO TABS
5.0000 mg | ORAL_TABLET | Freq: Once | ORAL | Status: AC
  Administered 2014-12-22: 5 mg via ORAL
  Filled 2014-12-22: qty 1

## 2014-12-22 MED ORDER — FUROSEMIDE 10 MG/ML IJ SOLN
40.0000 mg | Freq: Two times a day (BID) | INTRAMUSCULAR | Status: DC
  Filled 2014-12-22: qty 4

## 2014-12-22 MED ORDER — WARFARIN SODIUM 2.5 MG PO TABS
2.5000 mg | ORAL_TABLET | Freq: Every day | ORAL | Status: DC
  Administered 2014-12-22: 2.5 mg via ORAL
  Filled 2014-12-22 (×2): qty 1

## 2014-12-22 MED ORDER — SODIUM BICARBONATE 8.4 % IV SOLN
25.0000 meq | Freq: Once | INTRAVENOUS | Status: AC
  Administered 2014-12-22: 25 meq via INTRAVENOUS
  Filled 2014-12-22: qty 25

## 2014-12-22 MED ORDER — WARFARIN - PHYSICIAN DOSING INPATIENT: Freq: Every day | Status: DC

## 2014-12-22 MED ORDER — HYDRALAZINE HCL 20 MG/ML IJ SOLN
10.0000 mg | INTRAMUSCULAR | Status: DC | PRN
  Administered 2014-12-22 – 2015-01-07 (×19): 10 mg via INTRAVENOUS
  Filled 2014-12-22 (×19): qty 1

## 2014-12-22 MED ORDER — METOLAZONE 5 MG PO TABS
5.0000 mg | ORAL_TABLET | Freq: Once | ORAL | Status: AC
  Administered 2014-12-22: 5 mg via ORAL
  Filled 2014-12-22 (×2): qty 1

## 2014-12-22 MED ORDER — AMIODARONE LOAD VIA INFUSION
150.0000 mg | Freq: Once | INTRAVENOUS | Status: AC
  Administered 2014-12-22: 150 mg via INTRAVENOUS
  Filled 2014-12-22: qty 83.34

## 2014-12-22 MED ORDER — VANCOMYCIN HCL IN DEXTROSE 750-5 MG/150ML-% IV SOLN
750.0000 mg | Freq: Two times a day (BID) | INTRAVENOUS | Status: DC
  Administered 2014-12-22 – 2014-12-24 (×4): 750 mg via INTRAVENOUS
  Filled 2014-12-22 (×5): qty 150

## 2014-12-22 NOTE — Significant Event (Signed)
Increased MAP ranging 90-100 after levophed has been added and pump speed was increased to 8800. The settings on HMII are within normal limits for patient (ranges on pump flow 3.1-3.9, pulse index 6.6-7.8, power 4). Doppler MAP in 90-94. RN decreased rate on Levophed to 72mcg to assess if MAP would lower. However, Mean remain high. RN notified Dr. Haroldine Laws and Dr. Prescott Gum. Dr. Prescott Gum gave telephone order for hydralazine 10mg  Q2H PRN.

## 2014-12-22 NOTE — Plan of Care (Signed)
Problem: Phase I Progression Outcomes Goal: PT screen (if indicated) Outcome: Progressing Patient out of bed to chair today since 1400 with physical therapist, OT and RN.

## 2014-12-22 NOTE — Significant Event (Addendum)
1815pm-Dr.[name] called RN to have these orders implemented for [name]: increase lasix drip to 15mg , metolazone 5mg  one time dose, and BMP @ 2100pm. Per MD, RN to enter these orders in EPIC. [name], Therapist, sports.

## 2014-12-22 NOTE — Progress Notes (Signed)
Patient ID: [name], male   DOB: 12-15-1970, 44 y.o.   MRN: [id number]  HeartMate 2 Rounding Note POD # 2  Subjective:   preop acute on chronc HF, cardiogenic shock, IABP Postop: Extubated pod1, developed rapid afib-flutter refractory to amiodarone, digoxin Rv dysfunction requiring milrinone 8PI events in last 12 hrs, C0-ox 55 %  Will try to transition from epi to norepi fot RV support to reduce HR -      He did well with norepi preop Will plan to remove swan and mobilize OOB Surgical pain better today, creat up to 1.6, CVP 14   LVAD INTERROGATION:  HeartMate II LVAD:  Flow 3.8 liters/min, speed 8600, power 4.0, PI 6.2.    Objective:    Vital Signs:   Temp:  [100.4 F (38 C)-102 F (38.9 C)] 100.4 F (38 C) (09/03 0800) Pulse Rate:  [27-189] 128 (09/03 0800) Resp:  [14-44] 16 (09/03 0800) BP: (78-167)/(73-99) 102/83 mmHg (09/03 0700) SpO2:  [93 %-100 %] 99 % (09/03 0800) Arterial Line BP: (67-172)/(63-143) 87/73 mmHg (09/03 0800) FiO2 (%):  [40 %] 40 % (09/02 1047) Weight:  [187 lb 13.3 oz (85.2 kg)] 187 lb 13.3 oz (85.2 kg) (09/03 0430) Last BM Date: 12/18/14 Mean arterial Pressure 90's  Intake/Output:   Intake/Output Summary (Last 24 hours) at 12/22/14 0857 Last data filed at 12/22/14 0800  Gross per 24 hour  Intake 3580.91 ml  Output   3045 ml  Net 535.91 ml     Physical Exam: General:  Well appearing. No resp difficulty HEENT: normal Cor: distant heart sounds with LVAD hum present. Lungs: clear Abdomen: soft, nontender, nondistended. No hepatosplenomegaly. No bruits or masses. hypoactive bowel sounds. Extremities: no cyanosis, clubbing, rash, moderate edema Neuro: alert & orientedx3, cranial nerves grossly intact. moves all 4 extremities w/o difficulty. Affect pleasant  Telemetry: atrial flutter with RVR 130's  Labs: Basic Metabolic Panel:  Recent Labs Lab 12/19/14 0540 12/20/14 0350  12/20/14 1530 12/20/14 2005 12/20/14 2014 12/20/14 2020  12/21/14 0308 12/21/14 1601 12/21/14 1603 12/22/14 0331  NA 129* 126*  < > 132*  --  134* 134* 133* 134*  --  132*  K 4.9 4.8  < > 4.4  --  4.7 4.7 4.4 4.7  --  5.0  CL 96* 92*  < > 102  --  70* 100* 104 102  --  99*  CO2 23 23  --  23  --   --   --  21*  --   --  22  GLUCOSE 176* 176*  < > 124*  --  141* 135* 107* 145*  --  124*  BUN 21* 24*  < > 21*  --  23* 21* 15 17  --  19  CREATININE 1.43* 1.41*  < > 1.27* 1.29* 1.10 1.20 1.18 1.20 1.29* 1.58*  CALCIUM 9.1 9.0  --  8.4*  --   --   --  8.0*  --   --  8.8*  MG  --   --   --  2.3 2.5*  --   --  2.0  --  2.0 2.1  PHOS  --   --   --   --   --   --   --  4.5  --   --  5.4*  < > = values in this interval not displayed.  Liver Function Tests:  Recent Labs Lab 12/18/14 0525 12/21/14 0308 12/22/14 0331  AST 21 52* 61*  ALT  24 18 20   ALKPHOS 99 43 63  BILITOT 1.4* 3.1* 3.4*  PROT 7.4 5.1* 6.2*  ALBUMIN 2.9* 3.3* 3.2*   No results for input(s): LIPASE, AMYLASE in the last 168 hours. No results for input(s): AMMONIA in the last 168 hours.  CBC:  Recent Labs Lab 12/20/14 2005  12/21/14 0308 12/21/14 0750 12/21/14 1601 12/21/14 1603 12/22/14 0331  WBC 9.4  --  8.5 9.5  --  14.5* 19.1*  NEUTROABS 7.3  --  6.4  --   --   --  14.7*  HGB 9.3*  < > REPEATED TO VERIFY 8.3* 11.9* 9.4* 9.5*  HCT 28.4*  < > 24.8* 25.5* 35.0* 28.8* 28.6*  MCV 65.4*  --  65.8* 65.9*  --  65.9* 64.7*  PLT 134*  --  117* 121*  --  145* 168  < > = values in this interval not displayed.  INR:  Recent Labs Lab 12/20/14 1448 12/20/14 1459 12/21/14 0308 12/22/14 0331  INR 1.36 1.49 1.54* 1.53*    Other results:  EKG:   Imaging: Dg Chest Port 1 View  12/21/2014   CLINICAL DATA:  Status post placement of a left ventricular assist device 12/20/2014.  EXAM: PORTABLE CHEST - 1 VIEW  COMPARISON:  Plain film of the chest 12/20/2014  FINDINGS: Support tubes and lines are unchanged. Swan-Ganz catheter appears to have been pulled back slightly with the  tip in the proximal right descending interlobar pulmonary artery. No pneumothorax is identified. Basilar airspace opacities appear increased most consistent with atelectasis. No pneumothorax.  IMPRESSION: Increased left worse than right basilar atelectasis. The patient's examination is otherwise unchanged.   Electronically Signed   By: Inge Rise M.D.   On: 12/21/2014 08:00   Dg Chest Port 1 View  12/20/2014   CLINICAL DATA:  Status post placement of a left ventricular assist device today.  EXAM: PORTABLE CHEST - 1 VIEW  COMPARISON:  Single view of the chest 12/18/2014.  FINDINGS: There is partial visualization of a new left ventricular assist device. Two mediastinal drains and a left chest tube are in place. No pneumothorax is identified. The patient also has a new a right IJ approach Swan-Ganz catheter in place with the tip of the Swan in the descending right interlobar pulmonary artery. The catheter could be pulled back approximately 6 cm for positioning in the right main pulmonary artery. NG tube courses into the stomach with the tip below the inferior margin of film. Endotracheal tube tip is at the level of clavicular heads in good position. AICD is again noted.  Mild left basilar atelectasis is noted. The right lung is clear. No pleural effusion is identified. Cardiomegaly is seen as on the prior exam.  IMPRESSION: Swan-Ganz catheter tip projects in the descending right interlobar pulmonary artery. The line could be pulled back 6 cm for positioning in the right main pulmonary artery. Support apparatus is otherwise unremarkable. No pneumothorax.  Cardiomegaly without edema.   Electronically Signed   By: Inge Rise M.D.   On: 12/20/2014 16:29     Medications:     Scheduled Medications: . acetaminophen  1,000 mg Oral 4 times per day   Or  . acetaminophen (TYLENOL) oral liquid 160 mg/5 mL  1,000 mg Per Tube 4 times per day  . amiodarone  150 mg Intravenous Once  . antiseptic oral rinse  7  mL Mouth Rinse QID  . aspirin EC  325 mg Oral Daily   Or  . aspirin  324 mg Per Tube Daily   Or  . aspirin  300 mg Rectal Daily  . bisacodyl  10 mg Oral Daily   Or  . bisacodyl  10 mg Rectal Daily  . chlorhexidine  15 mL Mouth/Throat BID  . Chlorhexidine Gluconate Cloth  6 each Topical Q0600  . docusate sodium  200 mg Oral Daily  . furosemide  40 mg Intravenous Q12H  . insulin aspart  0-24 Units Subcutaneous 6 times per day  . metoCLOPramide (REGLAN) injection  10 mg Intravenous 4 times per day  . pantoprazole  40 mg Oral Daily  . sodium bicarbonate  25 mEq Intravenous Once  . sodium chloride  3 mL Intravenous Q12H  . vancomycin  1,000 mg Intravenous Q12H  . warfarin  2.5 mg Oral q1800  . Warfarin - Physician Dosing Inpatient   Does not apply q1800    Infusions: . sodium chloride Stopped (12/22/14 0600)  . sodium chloride    . sodium chloride 20 mL/hr at 12/22/14 0700  . amiodarone 60 mg/hr (12/22/14 0854)  . epinephrine 3 mcg/min (12/22/14 0840)  . lactated ringers Stopped (12/20/14 2000)  . lactated ringers Stopped (12/22/14 0600)  . milrinone 0.375 mcg/kg/min (12/22/14 0800)  . nitroGLYCERIN Stopped (12/20/14 1625)  . norepinephrine (LEVOPHED) Adult infusion Stopped (12/21/14 1700)    PRN Medications: sodium chloride, midazolam, morphine injection, ondansetron (ZOFRAN) IV, oxyCODONE, sodium chloride, traMADol   Assessment:   1. Acute/chonic systolic HF , NICM EF 0000000 with Cardiogenic shock on milrinone and levophed, IABP preoop, s/p HMII implant 12/20/2014 2. Chronic atrial fib 3. Postop atrial flutter with RVR 4. Expected EBL anemia 5. Fever early postop   Plan/Discussion:    He is hemodynamically fairly stable. VAD flow has remained on the low side since insertion in the 3-3.6 range but parameters otherwise ok. Co-ox was 54 this am. Extubated to NO by [place]. Will continue milrinone and  Transition  Epi to norepi  for now as well as inhaled  NO.  Atrial flutter  with RVR.Continue amiodarone and digoxin. No Cardizem or BB. Consider premedicated cardioversion later today if HR does not improve- another bolus of amio ordered  Chest tube output decreased DC posterior MT before OOB to chairContinue other  tubes for now.  Expected acute blood loss anemia exacerbated by volume infusion. Continue to follow. Avoid transfusion if possible since he may be a transplant candidate.   He  some volume excess now. Will continue  diuretic.    start anticoagulation today slowly.      I reviewed the LVAD parameters from today, and compared the results to the patient's prior recorded data.  No programming changes were made.  The LVAD is functioning within specified parameters.  The patient performs LVAD self-test daily.  LVAD interrogation was negative for any significant power changes, alarms or PI events/speed drops.  LVAD equipment check completed and is in good working order.  Back-up equipment present.   LVAD education done on emergency procedures and precautions and reviewed exit site care.  Length of Stay: [place] [facility] 12/22/2014, 8:57 AM

## 2014-12-22 NOTE — Progress Notes (Signed)
OT Cancellation Note  Patient Details Name: [name] MRN: [id number] DOB: [birth year]   Cancelled Treatment:    Reason Eval/Treat Not Completed: Other (comment). Pt still with [name], may pull later today, will check back as time allows.  [name] [id number] 12/22/2014, 9:24 AM

## 2014-12-22 NOTE — Progress Notes (Signed)
ANTIBIOTIC CONSULT NOTE - INITIAL  Pharmacy Consult for vancomycin and Zosyn Indication: HCAP  No Known Allergies  Patient Measurements: Height: 5\' 7"  (170.2 cm) Weight: 187 lb 13.3 oz (85.2 kg) IBW/kg (Calculated) : 66.1  Vital Signs: Temp: 99.9 F (37.7 C) (09/03 1030) Temp Source: Core (Comment) (09/03 1115) BP: 102/83 mmHg (09/03 0700) Pulse Rate: 53 (09/03 1030) Intake/Output from previous day: 09/02 0701 - 09/03 0700 In: 3671.8 [P.O.:369; I.V.:2942.8; NG/GT:60; IV Piggyback:300] Out: 3155 [Urine:1975; Emesis/NG output:350; Chest Tube:830] Intake/Output from this shift: Total I/O In: 466.4 [I.V.:266.4; IV Piggyback:200] Out: 510 [Urine:340; Chest Tube:170]  Labs:  Recent Labs  12/21/14 0750 12/21/14 1601 12/21/14 1603 12/22/14 0331  WBC 9.5  --  14.5* 19.1*  HGB 8.3* 11.9* 9.4* 9.5*  PLT 121*  --  145* 168  CREATININE  --  1.20 1.29* 1.58*   Estimated Creatinine Clearance: 60.9 mL/min (by C-G formula based on Cr of 1.58). No results for input(s): VANCOTROUGH, VANCOPEAK, VANCORANDOM, GENTTROUGH, GENTPEAK, GENTRANDOM, TOBRATROUGH, TOBRAPEAK, TOBRARND, AMIKACINPEAK, AMIKACINTROU, AMIKACIN in the last 72 hours.   Microbiology: Recent Results (from the past 720 hour(s))  Urine culture     Status: None   Collection Time: 11/30/14  3:32 PM  Result Value Ref Range Status   Specimen Description URINE, CLEAN CATCH  Final   Special Requests NONE  Final   Culture NO GROWTH 1 DAY  Final   Report Status 12/01/2014 FINAL  Final  MRSA PCR Screening     Status: None   Collection Time: 11/30/14  6:46 PM  Result Value Ref Range Status   MRSA by PCR NEGATIVE NEGATIVE Final    Comment:        The GeneXpert MRSA Assay (FDA approved for NASAL specimens only), is one component of a comprehensive MRSA colonization surveillance program. It is not intended to diagnose MRSA infection nor to guide or monitor treatment for MRSA infections.   MRSA PCR Screening     Status:  None   Collection Time: 12/14/14  3:20 PM  Result Value Ref Range Status   MRSA by PCR NEGATIVE NEGATIVE Final    Comment:        The GeneXpert MRSA Assay (FDA approved for NASAL specimens only), is one component of a comprehensive MRSA colonization surveillance program. It is not intended to diagnose MRSA infection nor to guide or monitor treatment for MRSA infections.   Surgical pcr screen     Status: Abnormal   Collection Time: 12/15/14  4:34 PM  Result Value Ref Range Status   MRSA, PCR NEGATIVE NEGATIVE Final   Staphylococcus aureus POSITIVE (A) NEGATIVE Final    Comment:        The Xpert SA Assay (FDA approved for NASAL specimens in patients over 60 years of age), is one component of a comprehensive surveillance program.  Test performance has been validated by [facility] for patients greater than or equal to 36 year old. It is not intended to diagnose infection nor to guide or monitor treatment.   Surgical pcr screen     Status: Abnormal   Collection Time: 12/17/14 11:26 AM  Result Value Ref Range Status   MRSA, PCR NEGATIVE NEGATIVE Final   Staphylococcus aureus POSITIVE (A) NEGATIVE Final    Comment:        The Xpert SA Assay (FDA approved for NASAL specimens in patients over 12 years of age), is one component of a comprehensive surveillance program.  Test performance has been validated by [facility]  for patients greater than or equal to 65 year old. It is not intended to diagnose infection nor to guide or monitor treatment.     Medical History: Past Medical History  Diagnosis Date  . Chronic systolic heart failure     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%)  . Atrial fibrillation -parosysmal      Rx w amiodarone  . Noncompliance     H/O  MEDICAL NONCOMPLIANCE  . 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 Tricuspid valve regurgitation     SEVERE  . Severe mitral regurgitation   . Polymorphic  ventricular tachycardia     RECURRENT WITH APPROPRIATE SHOCK THERAPY IN THE PAST  . Ventricular fibrillation     WITH APPROPRIATE SHOCK THERAPY IN THE PAST  . Hypertension   . Gout   . RA (rheumatoid arthritis)   . Automatic implantable cardiac defibrillator -BSX     single chamber  . GI bleed -massive     11 Units 2012  . Elevated LFTs   . H/O hyperthyroidism   . CHF (congestive heart failure)   . AKI (acute kidney injury)   . AICD (automatic cardioverter/defibrillator) present     Medications:  Prescriptions prior to admission  Medication Sig Dispense Refill Last Dose  . allopurinol (ZYLOPRIM) 100 MG tablet Take 100 mg by mouth daily.  0 12/14/2014 at Unknown time  . apixaban (ELIQUIS) 5 MG TABS tablet Take 1 tablet (5 mg total) by mouth 2 (two) times daily. 60 tablet 3 12/14/2014 at Unknown time  . carvedilol (COREG) 25 MG tablet Take 1 tablet (25 mg total) by mouth 2 (two) times daily with a meal. 60 tablet 1 12/14/2014 at 0930  . furosemide (LASIX) 40 MG tablet Take 1 tablet (40 mg total) by mouth 2 (two) times daily. 60 tablet 9 12/14/2014 at Unknown time  . spironolactone (ALDACTONE) 25 MG tablet Take 1 tablet (25 mg total) by mouth daily. 30 tablet 5 12/14/2014 at Unknown time   Assessment: 44 y/o male with severe cardiomyopathy s/p LVAD placement 9/1 now with a productive cough, fever, and elevated WBC to begin antibiotics for HCAP. He has been on vancomycin for post-op prophylaxis but is to continue this for HCAP. Adding Zosyn for HCAP coverage. SCr trending up, good UOP but is on furosemide. Tmax is 102 F, WBC up to 19.1, MRSA and Staph PCR +.  Vanc 9/1>> Zinacef 9/1>>9/3 Fluconazole 9/1>>9/2 Rifampin 9/1>>9/2 Zosyn 9/3>>  Goal of Therapy:  Vancomycin trough level 15-20 mcg/ml Eradication of infection  Plan:  - Decrease vancomycin to 750 mg IV q12h - Zosyn 3.375 g IV q8h to be infused over 4 hours - Monitor renal function, clinical progress, vancomycin level as  indicated  [employer], Pharm.D., BCPS Clinical Pharmacist Pager: [phone] 12/22/2014 11:32 AM

## 2014-12-22 NOTE — Progress Notes (Signed)
PT Cancellation Note  Patient Details Name: [name] MRN: [id number] DOB: 1970/05/05   Cancelled Treatment:    Reason Eval/Treat Not Completed: Patient not medically ready.  Pt still with [name], may pull later today, will check back as time allows.   Despina Pole 12/22/2014, 10:09 AM Carita Pian. Sanjuana Kava, [place] Pager [phone]

## 2014-12-22 NOTE — Progress Notes (Addendum)
VAD Team Rounding Note  Subjective:    s/p HMII implant 9/1  Extubated. On milrinone. Transitioning EPI to levophed. Still in rapid AF but rate slower on IV amio. Rates 120s. Feels ok.   CVP 15, PAP 44/25. Weight up 18 pounds. Feels weak but better. Coughing up tan secretions  Co-ox 55%. Creatinine 1.2 -> 1.5   LVAD INTERROGATION:  HeartMate II LVAD:  Flow 3.8 liters/min, speed 8600, power 4.3,  PI 6.9 several PI events   Objective:    Vital Signs:   Temp:  [100 F (37.8 C)-102 F (38.9 C)] 100.2 F (37.9 C) (09/03 0945) Pulse Rate:  [27-161] 63 (09/03 0945) Resp:  [14-44] 20 (09/03 0945) BP: (78-137)/(73-99) 102/83 mmHg (09/03 0700) SpO2:  [93 %-100 %] 98 % (09/03 0945) Arterial Line BP: (67-172)/(63-143) 109/93 mmHg (09/03 0945) FiO2 (%):  [40 %] 40 % (09/02 1047) Weight:  [85.2 kg (187 lb 13.3 oz)] 85.2 kg (187 lb 13.3 oz) (09/03 0430) Last BM Date: 12/18/14 Mean arterial Pressure 80s  Intake/Output:   Intake/Output Summary (Last 24 hours) at 12/22/14 0957 Last data filed at 12/22/14 0900  Gross per 24 hour  Intake 3528.84 ml  Output   3200 ml  Net 328.84 ml     Physical Exam: General:Sitting up in bed NAD HEENT: normal  Neck: supple. JVP RIJ swan  Cor Sternal dressing in place. + CTs: Mechanical heart sounds with LVAD hum present. Lungs:+ rhonchi Abdomen: soft, nontender, +distended. hypoactive bowel sounds. Driveline: C/D/I; securement device intact and driveline incorporated Extremities: no cyanosis, clubbing, rash, + edema Neuro: awake conversant  Telemetry: AF 120s  Labs: Basic Metabolic Panel:  Recent Labs Lab 12/19/14 0540 12/20/14 0350  12/20/14 1530 12/20/14 2005 12/20/14 2014 12/20/14 2020 12/21/14 0308 12/21/14 1601 12/21/14 1603 12/22/14 0331  NA 129* 126*  < > 132*  --  134* 134* 133* 134*  --  132*  K 4.9 4.8  < > 4.4  --  4.7 4.7 4.4 4.7  --  5.0  CL 96* 92*  < > 102  --  70* 100* 104 102  --  99*  CO2 23 23  --   23  --   --   --  21*  --   --  22  GLUCOSE 176* 176*  < > 124*  --  141* 135* 107* 145*  --  124*  BUN 21* 24*  < > 21*  --  23* 21* 15 17  --  19  CREATININE 1.43* 1.41*  < > 1.27* 1.29* 1.10 1.20 1.18 1.20 1.29* 1.58*  CALCIUM 9.1 9.0  --  8.4*  --   --   --  8.0*  --   --  8.8*  MG  --   --   --  2.3 2.5*  --   --  2.0  --  2.0 2.1  PHOS  --   --   --   --   --   --   --  4.5  --   --  5.4*  < > = values in this interval not displayed.  Liver Function Tests:  Recent Labs Lab 12/18/14 0525 12/21/14 0308 12/22/14 0331  AST 21 52* 61*  ALT 24 18 20   ALKPHOS 99 43 63  BILITOT 1.4* 3.1* 3.4*  PROT 7.4 5.1* 6.2*  ALBUMIN 2.9* 3.3* 3.2*   No results for input(s): LIPASE, AMYLASE in the last 168  hours. No results for input(s): AMMONIA in the last 168 hours.  CBC:  Recent Labs Lab 12/20/14 2005  12/21/14 0308 12/21/14 0750 12/21/14 1601 12/21/14 1603 12/22/14 0331  WBC 9.4  --  8.5 9.5  --  14.5* 19.1*  NEUTROABS 7.3  --  6.4  --   --   --  14.7*  HGB 9.3*  < > REPEATED TO VERIFY 8.3* 11.9* 9.4* 9.5*  HCT 28.4*  < > 24.8* 25.5* 35.0* 28.8* 28.6*  MCV 65.4*  --  65.8* 65.9*  --  65.9* 64.7*  PLT 134*  --  117* 121*  --  145* 168  < > = values in this interval not displayed.  INR:  Recent Labs Lab 12/20/14 1448 12/20/14 1459 12/21/14 0308 12/22/14 0331  INR 1.36 1.49 1.54* 1.53*    Other results:   Imaging: Dg Chest Port 1 View  12/22/2014   CLINICAL DATA:  LVAD.  EXAM: PORTABLE CHEST - 1 VIEW  COMPARISON:  12/21/2014  FINDINGS: Endotracheal tube has been removed. Left-sided AICD leads to the right ventricle. Right IJ Swan-Ganz catheter tip to the right pulmonary artery. Right IJ central line tip to the superior vena cava. Status post median sternotomy. Left ventricular assist device overlies the cardiac apex.  Heart is enlarged and stable in appearance. Pulmonary vascular congestion without overt edema. No new consolidations.  IMPRESSION: Status post removal of  endotracheal tube. Stable appearance of left ventricular assist device and other apparatus. Cardiomegaly and pulmonary vascular congestion.   Electronically Signed   By: Nolon Nations M.D.   On: 12/22/2014 09:23   Dg Chest Port 1 View  12/21/2014   CLINICAL DATA:  Status post placement of a left ventricular assist device 12/20/2014.  EXAM: PORTABLE CHEST - 1 VIEW  COMPARISON:  Plain film of the chest 12/20/2014  FINDINGS: Support tubes and lines are unchanged. Swan-Ganz catheter appears to have been pulled back slightly with the tip in the proximal right descending interlobar pulmonary artery. No pneumothorax is identified. Basilar airspace opacities appear increased most consistent with atelectasis. No pneumothorax.  IMPRESSION: Increased left worse than right basilar atelectasis. The patient's examination is otherwise unchanged.   Electronically Signed   By: Inge Rise M.D.   On: 12/21/2014 08:00   Dg Chest Port 1 View  12/20/2014   CLINICAL DATA:  Status post placement of a left ventricular assist device today.  EXAM: PORTABLE CHEST - 1 VIEW  COMPARISON:  Single view of the chest 12/18/2014.  FINDINGS: There is partial visualization of a new left ventricular assist device. Two mediastinal drains and a left chest tube are in place. No pneumothorax is identified. The patient also has a new a right IJ approach Swan-Ganz catheter in place with the tip of the Swan in the descending right interlobar pulmonary artery. The catheter could be pulled back approximately 6 cm for positioning in the right main pulmonary artery. NG tube courses into the stomach with the tip below the inferior margin of film. Endotracheal tube tip is at the level of clavicular heads in good position. AICD is again noted.  Mild left basilar atelectasis is noted. The right lung is clear. No pleural effusion is identified. Cardiomegaly is seen as on the prior exam.  IMPRESSION: Swan-Ganz catheter tip projects in the descending right  interlobar pulmonary artery. The line could be pulled back 6 cm for positioning in the right main pulmonary artery. Support apparatus is otherwise unremarkable. No pneumothorax.  Cardiomegaly without edema.  Electronically Signed   By: Inge Rise M.D.   On: 12/20/2014 16:29     Medications:     Scheduled Medications: . acetaminophen  1,000 mg Oral 4 times per day   Or  . acetaminophen (TYLENOL) oral liquid 160 mg/5 mL  1,000 mg Per Tube 4 times per day  . antiseptic oral rinse  7 mL Mouth Rinse QID  . aspirin EC  325 mg Oral Daily   Or  . aspirin  324 mg Per Tube Daily   Or  . aspirin  300 mg Rectal Daily  . bisacodyl  10 mg Oral Daily   Or  . bisacodyl  10 mg Rectal Daily  . chlorhexidine  15 mL Mouth/Throat BID  . Chlorhexidine Gluconate Cloth  6 each Topical Q0600  . docusate sodium  200 mg Oral Daily  . furosemide  40 mg Intravenous Q12H  . insulin aspart  0-24 Units Subcutaneous 6 times per day  . metoCLOPramide (REGLAN) injection  10 mg Intravenous 4 times per day  . pantoprazole  40 mg Oral Daily  . sodium chloride  3 mL Intravenous Q12H  . vancomycin  1,000 mg Intravenous Q12H  . warfarin  2.5 mg Oral q1800  . Warfarin - Physician Dosing Inpatient   Does not apply q1800    Infusions: . sodium chloride Stopped (12/22/14 0600)  . sodium chloride    . sodium chloride 20 mL/hr at 12/22/14 0700  . amiodarone 60 mg/hr (12/22/14 0900)  . epinephrine 1.5 mcg/min (12/22/14 [id number])  . lactated ringers Stopped (12/20/14 2000)  . lactated ringers Stopped (12/22/14 0600)  . milrinone 0.375 mcg/kg/min (12/22/14 0800)  . nitroGLYCERIN Stopped (12/20/14 1625)  . norepinephrine (LEVOPHED) Adult infusion 4 mcg/min (12/22/14 0913)    PRN Medications: sodium chloride, midazolam, morphine injection, ondansetron (ZOFRAN) IV, oxyCODONE, sodium chloride, traMADol   Assessment:   1. Cardiogenic shock s/p HMII implant 12/20/2014 2. Acute/chonic systolic HF   -- NICM EF  20-25% 3. Chronic AF with RVR 4. Expected EBL anemia 5. Acute on chronic renal failure 6.   Plan/Discussion:     A bit tenuous but making progress.   He is volume overloaded. + productive cough. Co-ox borderline. AF rate improved.  He needs to be diuresed. Will switch to lasix gtt. One dose metolazone. Agree with switching epi to levophed and see how it goes. I increased VAD speed to 8800 and can go higher if needed. Continue amio for AF. Will add Zosyn. Luiz Blare out today. Place PICC soon.   D/w Dr. Prescott Gum  The patient is critically ill with multiple organ systems failure and requires high complexity decision making for assessment and support, frequent evaluation and titration of therapies, application of advanced monitoring technologies and extensive interpretation of multiple databases.   The patient is critically ill with multiple organ systems failure and requires high complexity decision making for assessment and support, frequent evaluation and titration of therapies, application of advanced monitoring technologies and extensive interpretation of multiple databases.   Critical Care Time devoted to patient care services described in this note is 40 Minutes.   I reviewed the LVAD parameters from today, and compared the results to the patient's prior recorded data.  No programming changes were made.  The LVAD is functioning within specified parameters.  The patient performs LVAD self-test daily.  LVAD interrogation was negative for any significant power changes, alarms or PI events/speed drops.  LVAD equipment check completed and is in good working order.  Back-up equipment present.   LVAD education done on emergency procedures and precautions and reviewed exit site care.  Length of Stay: 8  [name], Daniel MD 12/22/2014, 9:57 AM  VAD Team --- VAD ISSUES ONLY--- Pager [phone] (7am - 7am)  Advanced Heart Failure Team  Pager [phone] (M-F; 7a - 4p)  Please contact [facility] for night-coverage after hours (4p -7a ) and weekends on [email or web address]

## 2014-12-22 NOTE — Evaluation (Addendum)
Occupational Therapy Evaluation Patient Details Name: [name] MRN: [id number] DOB: [birth year] Today's Date: 12/22/2014    History of Present Illness Patient is a 44 yo male admitted 12/14/14 with heart failure.  Patient s/p LVAD on 12/20/14.  PMH:  NICM, EF 20-25%, CHF, ICD, Afib, HTN, severe MR/TR, RA   Clinical Impression   This 44 yo male admitted and underwent above presents to acute OT with decreased mobility, decreased balance, decreased overall strength, and sternal precautions all affecting his PLOF of Independent with BADLs/IADLs. He will benefit from acute OT with follow up [place].    Follow Up Recommendations  Home health OT    Equipment Recommendations  3 in 1 bedside comode (rollator)       Precautions / Restrictions Precautions Precautions: Sternal;Fall Precaution Comments: Reviewed sternal precautions Restrictions Weight Bearing Restrictions: Yes Other Position/Activity Restrictions: Sternal precautions      Mobility Bed Mobility Overal bed mobility: Needs Assistance Bed Mobility: Supine to Sit     Supine to sit: Min assist;+2 for safety/equipment;HOB elevated     General bed mobility comments: Cues to move legs to the EOB and then use them to anchor himself to help scoot himself around more  Transfers Overall transfer level: Needs assistance Equipment used: 2 person hand held assist Transfers: Sit to/from [employer] Transfers Sit to Stand: Mod assist;+2 physical assistance Stand pivot transfers: Mod assist;+2 physical assistance       General transfer comment: Verbal cues for technique.  Assist to power up to standing.  Required assist to maintain upright position and balance.  LE weakness noted, with LE's shaky/buckling.  Patient able to take several shuffle steps to pivot to chair with +2 assist.  Mod assist to scoot back into chair.    Balance Overall balance assessment: Needs assistance Sitting-balance support: Feet supported;No upper  extremity supported Sitting balance-Leahy Scale: Poor Sitting balance - Comments: tendency to posterior lean without trying to self correct without cues Postural control: Posterior lean Standing balance support: Bilateral upper extremity supported Standing balance-Leahy Scale: Poor                              ADL Overall ADL's : Needs assistance/impaired                                       General ADL Comments: total A at this time due to all lines/leads/tubes     Vision Additional Comments: No change from baseline          Pertinent Vitals/Pain Pain Assessment: No/denies pain Pain Score: 0-No pain Pain Location: Surgical site Pain Descriptors / Indicators: Sore Pain Intervention(s): Limited activity within patient's tolerance;Monitored during session;Repositioned     Hand Dominance Right   Extremity/Trunk Assessment Upper Extremity Assessment Upper Extremity Assessment: Generalized weakness   Lower Extremity Assessment Lower Extremity Assessment: Defer to PT evaluation       Communication Communication Communication: No difficulties   ognition Arousal/Alertness: Awake/alert Behavior During Therapy: WFL for tasks assessed/performed Overall Cognitive Status: Within Functional Limits for tasks assessed                                Home Living Family/patient expects to be discharged to:: Private residence Living Arrangements: Alone Available Help at Discharge: Family;Available 24 hours/day (2 sisters  from [facility]) Type of Home: House Home Access: Level entry     Home Layout: One level     Bathroom Shower/Tub: Teacher, early years/pre: Standard     Home Equipment: None          Prior Functioning/Environment Level of Independence: Independent        Comments: Driving    OT Diagnosis: Generalized weakness   OT Problem List: Decreased strength;Decreased range of motion;Impaired balance (sitting  and/or standing);Decreased activity tolerance   OT Treatment/Interventions: Self-care/ADL training;Patient/family education;Balance training;Therapeutic exercise;Therapeutic activities;DME and/or AE instruction    OT Goals(Current goals can be found in the care plan section) Acute Rehab OT Goals Patient Stated Goal: None stated today OT Goal Formulation: With patient Time For Goal Achievement: 01/05/15 Potential to Achieve Goals: Good ADL Goals Pt Will Perform Grooming: with set-up;with supervision;standing Pt Will Perform Upper Body Bathing: with set-up;with supervision;standing;sitting Pt Will Perform Lower Body Bathing: with set-up;with supervision;sit to/from stand Pt Will Perform Upper Body Dressing: with set-up;with supervision;sitting;standing Pt Will Perform Lower Body Dressing: with set-up;with supervision;sit to/from stand Pt Will Transfer to Toilet: with supervision;ambulating;bedside commode (over toilet) Pt Will Perform Toileting - Clothing Manipulation and hygiene: with supervision;sit to/from stand Additional ADL Goal #1: Pt will be able to change from battery source<>main power source independently Additional ADL Goal #2: Pt will be able to state what 3 things he should have all times in the bag with him Additional ADL Goal #3: Pt will be Mod I and OOB for BADLs  OT Frequency: Min 3X/week           Co-evaluation PT/OT/SLP Co-Evaluation/Treatment: Yes Reason for Co-Treatment: Complexity of the patient's impairments (multi-system involvement);For patient/therapist safety PT goals addressed during session: Mobility/safety with mobility        End of Session Nurse Communication:  (nursing assist [place] with lines in process of getting pt up)  Activity Tolerance: Patient tolerated treatment well (for first time OOB lost LVAD) Patient left: in chair;with call bell/phone within reach;with nursing/sitter in room   Time: 1335-1404 OT Time Calculation (min): 29 min Charges:   OT General Charges $OT Visit: 1 Procedure OT Evaluation $Initial OT Evaluation Tier I: 1 Procedure  [name] [id number] 12/22/2014, 2:31 PM

## 2014-12-22 NOTE — Progress Notes (Addendum)
Physical Therapy Evaluation  Assessment:  Patient presents with problems listed below.  Will benefit from acute PT to maximize functional independence prior to discharge home with assist from sisters.  Recommendations:  HHPT; 24 hour assist; RW - will continue to assess needs    12/22/14 1411  PT Visit Information  Last PT Received On 12/22/14  Assistance Needed +2  PT/OT/SLP Co-Evaluation/Treatment Yes  Reason for Co-Treatment Complexity of the patient's impairments (multi-system involvement);For patient/therapist safety  PT goals addressed during session Mobility/safety with mobility  History of Present Illness Patient is a 44 yo male admitted 12/14/14 with heart failure.  Patient s/p LVAD on 12/20/14.  PMH:  NICM, EF 20-25%, CHF, ICD, Afib, HTN, severe MR/TR, RA  Precautions  Precautions Sternal;Fall  Precaution Comments Reviewed sternal precautions  Restrictions  Weight Bearing Restrictions No  Other Position/Activity Restrictions Sternal precautions  Home Living  Family/patient expects to be discharged to: Private residence  Living Arrangements Alone  Available Help at Discharge Family;Available 24 hours/day (2 sisters from [facility])  Type of [place] Access Level entry  Home Layout One level  Bathroom Shower/Tub Tub/shower unit  Automotive engineer None  Prior Function  Level of Independence Independent  Comments Driving  Communication  Communication No difficulties  Pain Assessment  Pain Assessment 0-10  Pain Score 5  Pain Location Surgical site  Pain Descriptors / Indicators Sore  Pain Intervention(s) Limited activity within patient's tolerance;Monitored during session;Repositioned  Cognition  Arousal/Alertness Lethargic  Behavior During Therapy [facility] for tasks assessed/performed;Flat affect  Overall Cognitive Status Within Functional Limits for tasks assessed  Upper Extremity Assessment  Upper Extremity Assessment Defer to OT evaluation   Lower Extremity Assessment  Lower Extremity Assessment Generalized weakness  Bed Mobility  Overal bed mobility Needs Assistance;+2 for physical assistance  Bed Mobility Supine to Sit  Supine to sit Min assist;+2 for physical assistance;HOB elevated  General bed mobility comments Verbal cues for technique and to maintain sternal precautions.  Patient able to move LE's off of bed.  Min assist of 2 to initiate trunk elevation.  Patient able to assist with moving to upright sitting position.  Required min assist for balance in sitting, with posterior lean.  Transfers  Overall transfer level Needs assistance  Equipment used 2 person hand held assist  Transfers Sit to/from [employer] Transfers  Sit to Stand Mod assist;+2 physical assistance  Stand pivot transfers Mod assist;+2 physical assistance  General transfer comment Verbal cues for technique.  Assist to power up to standing.  Required assist to maintain upright position and balance.  LE weakness noted, with LE's shaky/buckling.  Patient able to take several shuffle steps to pivot to chair with +2 assist.  Mod assist to scoot back into chair.  Balance  Overall balance assessment Needs assistance  Sitting-balance support Feet unsupported  Sitting balance-Leahy Scale Poor  Postural control Posterior lean  Standing balance support Bilateral upper extremity supported  Standing balance-Leahy Scale Poor  General Comments  General comments (skin integrity, edema, etc.) Nursing assist to manage multiple lines  Exercises  Exercises General Lower Extremity  General Exercises - Lower Extremity  Ankle Circles/Pumps AROM;Both;5 reps;Supine  PT - End of Session  Equipment Utilized During Treatment Oxygen  Activity Tolerance Patient limited by fatigue;Patient limited by pain;Patient limited by lethargy  Patient left in chair;with call bell/phone within reach;with nursing/sitter in room  Nurse Communication Mobility status  PT Assessment  PT  Therapy Diagnosis  Difficulty walking;Generalized weakness;Acute pain  PT Recommendation/Assessment Patient needs continued PT services  PT Problem List Decreased strength;Decreased activity tolerance;Decreased balance;Decreased mobility;Decreased knowledge of use of DME;Decreased knowledge of precautions;Cardiopulmonary status limiting activity;Pain  PT Plan  PT Frequency (ACUTE ONLY) Min 3X/week  PT Treatment/Interventions (ACUTE ONLY) DME instruction;Gait training;Functional mobility training;Therapeutic activities;Patient/family education;Therapeutic exercise  PT Recommendation  Follow Up Recommendations Home health PT;Supervision/Assistance - 24 hour  PT equipment Rolling walker with 5" wheels  Individuals Consulted  Consulted and Agree with Results and Recommendations Patient  Acute Rehab PT Goals  Patient Stated Goal None stated today  PT Goal Formulation With patient  Time For Goal Achievement 12/29/14  Potential to Achieve Goals Good  PT Time Calculation  PT Start Time (ACUTE ONLY) 1335  PT Stop Time (ACUTE ONLY) 1406  PT Time Calculation (min) (ACUTE ONLY) 31 min  PT General Charges  $$ ACUTE PT VISIT 1 Procedure  PT Evaluation  $Initial PT Evaluation Tier I 1 Procedure  Carita Pian. Sanjuana Kava, [place] Pager [phone]

## 2014-12-23 ENCOUNTER — Inpatient Hospital Stay (HOSPITAL_COMMUNITY): Payer: 59

## 2014-12-23 LAB — COMPREHENSIVE METABOLIC PANEL
ALT: 20 U/L (ref 17–63)
AST: 42 U/L — AB (ref 15–41)
Albumin: 2.7 g/dL — ABNORMAL LOW (ref 3.5–5.0)
Alkaline Phosphatase: 65 U/L (ref 38–126)
Anion gap: 10 (ref 5–15)
BUN: 28 mg/dL — AB (ref 6–20)
CHLORIDE: 95 mmol/L — AB (ref 101–111)
CO2: 25 mmol/L (ref 22–32)
CREATININE: 1.87 mg/dL — AB (ref 0.61–1.24)
Calcium: 9 mg/dL (ref 8.9–10.3)
GFR calc Af Amer: 48 mL/min — ABNORMAL LOW (ref >60)
GFR, EST NON AFRICAN AMERICAN: 42 mL/min — AB (ref >60)
Glucose, Bld: 117 mg/dL — ABNORMAL HIGH (ref 65–99)
Potassium: 3.9 mmol/L (ref 3.5–5.1)
SODIUM: 130 mmol/L — AB (ref 135–145)
Total Bilirubin: 2.6 mg/dL — ABNORMAL HIGH (ref 0.3–1.2)
Total Protein: 6.7 g/dL (ref 6.5–8.1)

## 2014-12-23 LAB — POCT I-STAT, CHEM 8
BUN: 33 mg/dL — ABNORMAL HIGH (ref 6–20)
CALCIUM ION: 1.22 mmol/L (ref 1.12–1.23)
Chloride: 91 mmol/L — ABNORMAL LOW (ref 101–111)
Creatinine, Ser: 2 mg/dL — ABNORMAL HIGH (ref 0.61–1.24)
GLUCOSE: 118 mg/dL — AB (ref 65–99)
HCT: 34 % — ABNORMAL LOW (ref 39.0–52.0)
HEMOGLOBIN: 11.6 g/dL — AB (ref 13.0–17.0)
POTASSIUM: 3.8 mmol/L (ref 3.5–5.1)
Sodium: 130 mmol/L — ABNORMAL LOW (ref 135–145)
TCO2: 27 mmol/L (ref 0–100)

## 2014-12-23 LAB — TYPE AND SCREEN
ABO/RH(D): O POS
Antibody Screen: NEGATIVE
UNIT DIVISION: 0
UNIT DIVISION: 0
Unit division: 0
Unit division: 0

## 2014-12-23 LAB — CBC WITH DIFFERENTIAL/PLATELET
BASOS PCT: 0 % (ref 0–1)
Basophils Absolute: 0 10*3/uL (ref 0.0–0.1)
EOS PCT: 0 % (ref 0–5)
Eosinophils Absolute: 0 10*3/uL (ref 0.0–0.7)
HEMATOCRIT: 27.2 % — AB (ref 39.0–52.0)
Hemoglobin: 8.8 g/dL — ABNORMAL LOW (ref 13.0–17.0)
LYMPHS ABS: 1.3 10*3/uL (ref 0.7–4.0)
Lymphocytes Relative: 8 % — ABNORMAL LOW (ref 12–46)
MCH: 20.8 pg — ABNORMAL LOW (ref 26.0–34.0)
MCHC: 32.4 g/dL (ref 30.0–36.0)
MCV: 64.3 fL — AB (ref 78.0–100.0)
MONO ABS: 1.8 10*3/uL — AB (ref 0.1–1.0)
Monocytes Relative: 11 % (ref 3–12)
NEUTROS ABS: 13.5 10*3/uL — AB (ref 1.7–7.7)
Neutrophils Relative %: 81 % — ABNORMAL HIGH (ref 43–77)
Platelets: 242 10*3/uL (ref 150–400)
RBC: 4.23 MIL/uL (ref 4.22–5.81)
RDW: 16.4 % — AB (ref 11.5–15.5)
WBC: 16.6 10*3/uL — ABNORMAL HIGH (ref 4.0–10.5)

## 2014-12-23 LAB — PROTIME-INR
INR: 1.38 (ref 0.00–1.49)
Prothrombin Time: 17.1 seconds — ABNORMAL HIGH (ref 11.6–15.2)

## 2014-12-23 LAB — CARBOXYHEMOGLOBIN
Carboxyhemoglobin: 1.2 % (ref 0.5–1.5)
Methemoglobin: 1.3 % (ref 0.0–1.5)
O2 SAT: 67.9 %
TOTAL HEMOGLOBIN: 8.9 g/dL — AB (ref 13.5–18.0)

## 2014-12-23 LAB — PHOSPHORUS: Phosphorus: 5.5 mg/dL — ABNORMAL HIGH (ref 2.5–4.6)

## 2014-12-23 LAB — BLOOD GAS, ARTERIAL
ACID-BASE DEFICIT: 0.3 mmol/L (ref 0.0–2.0)
BICARBONATE: 24.3 meq/L — AB (ref 20.0–24.0)
Nitric Oxide: 4
O2 CONTENT: 4 L/min
O2 Saturation: 98.5 %
PATIENT TEMPERATURE: 97
PCO2 ART: 40.9 mmHg (ref 35.0–45.0)
TCO2: 25.6 mmol/L (ref 0–100)
pH, Arterial: 7.386 (ref 7.350–7.450)
pO2, Arterial: 120 mmHg — ABNORMAL HIGH (ref 80.0–100.0)

## 2014-12-23 LAB — GLUCOSE, CAPILLARY
GLUCOSE-CAPILLARY: 114 mg/dL — AB (ref 65–99)
GLUCOSE-CAPILLARY: 123 mg/dL — AB (ref 65–99)
GLUCOSE-CAPILLARY: 116 mg/dL — AB (ref 65–99)
Glucose-Capillary: 120 mg/dL — ABNORMAL HIGH (ref 65–99)

## 2014-12-23 LAB — LACTATE DEHYDROGENASE: LDH: 286 U/L — ABNORMAL HIGH (ref 98–192)

## 2014-12-23 LAB — MAGNESIUM: Magnesium: 2 mg/dL (ref 1.7–2.4)

## 2014-12-23 MED ORDER — WARFARIN SODIUM 5 MG PO TABS
5.0000 mg | ORAL_TABLET | Freq: Every day | ORAL | Status: DC
  Administered 2014-12-23: 5 mg via ORAL
  Filled 2014-12-23 (×2): qty 1

## 2014-12-23 MED ORDER — SODIUM CHLORIDE 0.9 % IJ SOLN
10.0000 mL | Freq: Two times a day (BID) | INTRAMUSCULAR | Status: DC
  Administered 2014-12-23 – 2015-01-06 (×13): 10 mL

## 2014-12-23 MED ORDER — SODIUM CHLORIDE 0.9 % IJ SOLN
10.0000 mL | INTRAMUSCULAR | Status: DC | PRN
  Administered 2014-12-27 – 2015-01-08 (×5): 10 mL
  Filled 2014-12-23 (×5): qty 40

## 2014-12-23 MED ORDER — AMIODARONE HCL IN DEXTROSE 360-4.14 MG/200ML-% IV SOLN
30.0000 mg/h | INTRAVENOUS | Status: DC
Start: 2014-12-23 — End: 2014-12-24
  Administered 2014-12-23 – 2014-12-24 (×2): 30 mg/h via INTRAVENOUS
  Filled 2014-12-23 (×5): qty 200

## 2014-12-23 MED ORDER — POTASSIUM CHLORIDE 10 MEQ/50ML IV SOLN
10.0000 meq | INTRAVENOUS | Status: AC
  Administered 2014-12-23 (×2): 10 meq via INTRAVENOUS
  Filled 2014-12-23 (×2): qty 50

## 2014-12-23 NOTE — Progress Notes (Addendum)
VAD Team Rounding Note  Subjective:    s/p HMII implant 9/1  Epi switched to norepi yesterday due to high v-rates with AF. VAD speed increased to 8800 yesterday and then 9000 this am. On lasix gtt. Urine output now improved. Weight down 6 pounds.   Luiz Blare out. CVP 14. Sitting up in chair. Feeling a bit better. HR 110-120. No BM yet. But belly rumbling.   Co-ox 68% Cr 1.5-> 1.9 -> 1.8   LVAD INTERROGATION:  HeartMate II LVAD:  Flow 3.6 liters/min, speed 9000, power 4.5,  PI 5.7 several PI events   Objective:    Vital Signs:   Temp:  [97.2 F (36.2 C)-100 F (37.8 C)] 97.2 F (36.2 C) (09/04 1000) Pulse Rate:  [25-121] 111 (09/04 1000) Resp:  [9-30] 13 (09/04 1000) BP: (85-95)/(71-79) 89/74 mmHg (09/04 0900) SpO2:  [97 %-100 %] 100 % (09/04 1000) Arterial Line BP: (83-133)/(71-98) 85/75 mmHg (09/04 1000) Weight:  [82.3 kg (181 lb 7 oz)] 82.3 kg (181 lb 7 oz) (09/04 0700) Last BM Date: 12/18/14 Mean arterial Pressure 80s  Intake/Output:   Intake/Output Summary (Last 24 hours) at 12/23/14 1033 Last data filed at 12/23/14 0927  Gross per 24 hour  Intake 2279.02 ml  Output   3510 ml  Net -1230.98 ml     Physical Exam: General:Sitting up in chair NAD HEENT: normal  Neck: supple.  Cor Sternal dressing in place. + CTs: Mechanical heart sounds with LVAD hum present. Lungs:+ rhonchi Abdomen: soft, nontender, +distended. hypoactive bowel sounds. Driveline: C/D/I; securement device intact and driveline incorporated Extremities: no cyanosis, clubbing, rash, 1+ edema Neuro: awake conversant  Telemetry: AF 110-120s  Labs: Basic Metabolic Panel:  Recent Labs Lab 12/20/14 1530 12/20/14 2005  12/21/14 0308 12/21/14 1601 12/21/14 1603 12/22/14 0331 12/22/14 1612 12/22/14 2153 12/23/14 0351  NA 132*  --   < > 133* 134*  --  132* 132* 132* 130*  K 4.4  --   < > 4.4 4.7  --  5.0 4.7 4.2 3.9  CL 102  --   < > 104 102  --  99* 99* 97* 95*  CO2 23  --   --  21*   --   --  22  --  23 25  GLUCOSE 124*  --   < > 107* 145*  --  124* 123* 142* 117*  BUN 21*  --   < > 15 17  --  19 27* 26* 28*  CREATININE 1.27* 1.29*  < > 1.18 1.20 1.29* 1.58* 1.80* 1.91* 1.87*  CALCIUM 8.4*  --   --  8.0*  --   --  8.8*  --  9.1 9.0  MG 2.3 2.5*  --  2.0  --  2.0 2.1  --   --  2.0  PHOS  --   --   --  4.5  --   --  5.4*  --   --  5.5*  < > = values in this interval not displayed.  Liver Function Tests:  Recent Labs Lab 12/18/14 0525 12/21/14 0308 12/22/14 0331 12/23/14 0351  AST 21 52* 61* 42*  ALT 24 18 20 20   ALKPHOS 99 43 63 65  BILITOT 1.4* 3.1* 3.4* 2.6*  PROT 7.4 5.1* 6.2* 6.7  ALBUMIN 2.9* 3.3* 3.2* 2.7*   No results for input(s): LIPASE, AMYLASE in the last 168 hours. No results for input(s): AMMONIA in the last 168  hours.  CBC:  Recent Labs Lab 12/20/14 2005  12/21/14 0308 12/21/14 0750 12/21/14 1601 12/21/14 1603 12/22/14 0331 12/22/14 1612 12/23/14 0351  WBC 9.4  --  8.5 9.5  --  14.5* 19.1*  --  16.6*  NEUTROABS 7.3  --  6.4  --   --   --  14.7*  --  13.5*  HGB 9.3*  < > REPEATED TO VERIFY 8.3* 11.9* 9.4* 9.5* 11.9* 8.8*  HCT 28.4*  < > 24.8* 25.5* 35.0* 28.8* 28.6* 35.0* 27.2*  MCV 65.4*  --  65.8* 65.9*  --  65.9* 64.7*  --  64.3*  PLT 134*  --  117* 121*  --  145* 168  --  242  < > = values in this interval not displayed.  INR:  Recent Labs Lab 12/20/14 1448 12/20/14 1459 12/21/14 0308 12/22/14 0331 12/23/14 0351  INR 1.36 1.49 1.54* 1.53* 1.38    Other results:   Imaging: Dg Chest Port 1 View  12/23/2014   CLINICAL DATA:  Shortness of breath.  EXAM: PORTABLE CHEST - 1 VIEW  COMPARISON:  Yesterday.  FINDINGS: The right jugular Swan-Ganz catheter has been removed and the sheath remains in place. Additional right jugular catheter tip remains at the superior cavoatrial junction. Stable left subclavian AICD lead, enlarged cardiac silhouette and median sternotomy wires. Mediastinal and left chest tubes remain in place with  no pneumothorax seen. Stable poor inspiration with crowding of the lung markings and mildly prominent vasculature. The left ventricular assist device is unchanged.  IMPRESSION: Stable cardiomegaly and pulmonary vascular congestion.   Electronically Signed   By: Claudie Revering M.D.   On: 12/23/2014 09:34   Dg Chest Port 1 View  12/22/2014   CLINICAL DATA:  LVAD.  EXAM: PORTABLE CHEST - 1 VIEW  COMPARISON:  12/21/2014  FINDINGS: Endotracheal tube has been removed. Left-sided AICD leads to the right ventricle. Right IJ Swan-Ganz catheter tip to the right pulmonary artery. Right IJ central line tip to the superior vena cava. Status post median sternotomy. Left ventricular assist device overlies the cardiac apex.  Heart is enlarged and stable in appearance. Pulmonary vascular congestion without overt edema. No new consolidations.  IMPRESSION: Status post removal of endotracheal tube. Stable appearance of left ventricular assist device and other apparatus. Cardiomegaly and pulmonary vascular congestion.   Electronically Signed   By: Nolon Nations M.D.   On: 12/22/2014 09:23     Medications:     Scheduled Medications: . acetaminophen  1,000 mg Oral 4 times per day   Or  . acetaminophen (TYLENOL) oral liquid 160 mg/5 mL  1,000 mg Per Tube 4 times per day  . antiseptic oral rinse  7 mL Mouth Rinse QID  . aspirin EC  325 mg Oral Daily   Or  . aspirin  324 mg Per Tube Daily   Or  . aspirin  300 mg Rectal Daily  . bisacodyl  10 mg Oral Daily   Or  . bisacodyl  10 mg Rectal Daily  . chlorhexidine  15 mL Mouth/Throat BID  . Chlorhexidine Gluconate Cloth  6 each Topical Q0600  . docusate sodium  200 mg Oral Daily  . insulin aspart  0-24 Units Subcutaneous 6 times per day  . metoCLOPramide (REGLAN) injection  10 mg Intravenous 4 times per day  . pantoprazole  40 mg Oral Daily  . piperacillin-tazobactam (ZOSYN)  IV  3.375 g Intravenous Q8H  . sodium chloride  3 mL Intravenous Q12H  .  vancomycin  750 mg  Intravenous Q12H  . warfarin  5 mg Oral q1800  . Warfarin - Physician Dosing Inpatient   Does not apply q1800    Infusions: . sodium chloride Stopped (12/22/14 0600)  . sodium chloride    . sodium chloride 20 mL/hr at 12/23/14 0700  . amiodarone 30 mg/hr (12/23/14 0801)  . furosemide (LASIX) infusion 15 mg/hr (12/23/14 0700)  . lactated ringers Stopped (12/20/14 2000)  . lactated ringers Stopped (12/22/14 0600)  . milrinone 0.375 mcg/kg/min (12/23/14 0700)  . nitroGLYCERIN Stopped (12/20/14 1625)  . norepinephrine (LEVOPHED) Adult infusion 2.027 mcg/min (12/23/14 0700)    PRN Medications: sodium chloride, hydrALAZINE, midazolam, morphine injection, ondansetron (ZOFRAN) IV, oxyCODONE, sodium chloride, traMADol   Assessment:   1. Cardiogenic shock s/p HMII implant 12/20/2014 2. Acute/chonic systolic HF   -- NICM EF 0000000 3. Chronic AF with RVR 4. Expected EBL anemia 5. Acute on chronic renal failure 6. PNA   Plan/Discussion:     Making progress. Suspect he had component of ATN. Now improving. Diuresing well with lasix gtt. Will continue.   Co-ox looks great. Can wean levophed slowly. Continue milrinone. VAD speed now at 9000. Can adjust as needed   Continue Vanc and Zosyn for probable PNA. WBC improving.   No BM yet. On Reglan now.   Coumadin started yesterday.   The patient is critically ill with multiple organ systems failure and requires high complexity decision making for assessment and support, frequent evaluation and titration of therapies, application of advanced monitoring technologies and extensive interpretation of multiple databases.   Critical Care Time devoted to patient care services described in this note is 35 Minutes.   I reviewed the LVAD parameters from today, and compared the results to the patient's prior recorded data.  No programming changes were made.  The LVAD is functioning within specified parameters.  The patient performs LVAD self-test  daily.  LVAD interrogation was negative for any significant power changes, alarms or PI events/speed drops.  LVAD equipment check completed and is in good working order.  Back-up equipment present.   LVAD education done on emergency procedures and precautions and reviewed exit site care.  Length of Stay: 9  [name], Daniel MD 12/23/2014, 10:33 AM  VAD Team --- VAD ISSUES ONLY--- Pager [phone] (7am - 7am)  Advanced Heart Failure Team  Pager [phone] (M-F; 7a - 4p)  Please contact [facility] for night-coverage after hours (4p -7a ) and weekends on [email or web address]

## 2014-12-23 NOTE — Progress Notes (Signed)
Nitric wean started per MD order. Decreased to 3ppm at this time, RT will monitor.

## 2014-12-23 NOTE — Progress Notes (Signed)
Peripherally Inserted Central Catheter/Midline Placement  The IV Nurse has discussed with the patient and/or persons authorized to consent for the patient, the purpose of this procedure and the potential benefits and risks involved with this procedure.  The benefits include less needle sticks, lab draws from the catheter and patient may be discharged home with the catheter.  Risks include, but not limited to, infection, bleeding, blood clot (thrombus formation), and puncture of an artery; nerve damage and irregular heat beat.  Alternatives to this procedure were also discussed.  PICC/Midline Placement Documentation  PICC / Midline Double Lumen [id number] PICC Right Basilic 41 cm 0 cm (Active)  Indication for Insertion or Continuance of Line Vasoactive infusions 12/23/2014 12:50 PM  Exposed Catheter (cm) 0 cm 12/23/2014 12:50 PM  Site Assessment Clean;Dry;Intact 12/23/2014 12:50 PM  Lumen #1 Status Flushed;Saline locked;Blood return noted 12/23/2014 12:50 PM  Lumen #2 Status Flushed;Saline locked;Blood return noted 12/23/2014 12:50 PM  Dressing Type Transparent 12/23/2014 12:50 PM  Dressing Status Clean;Dry;Intact;Antimicrobial disc in place 12/23/2014 12:50 PM  Line Care Connections checked and tightened 12/23/2014 12:50 PM  Line Adjustment (NICU/IV Team Only) No 12/23/2014 12:50 PM  Dressing Intervention New dressing 12/23/2014 12:50 PM  Dressing Change Due 12/30/14 12/23/2014 12:50 PM       [facility] Payment 12/23/2014, 12:51 PM

## 2014-12-23 NOTE — Progress Notes (Signed)
Nitric turned off at this time per nitric protocol.  Pt now on 5L [place] tolerating well.  RT will monitor.

## 2014-12-23 NOTE — Progress Notes (Signed)
Patient ID: [name], male   DOB: 1970/09/11, 44 y.o.   MRN: [id number]  HeartMate 2 Rounding Note POD # 3  Subjective:   preop acute on chronc HF, cardiogenic shock, IABP Postop: Extubated pod1, developed rapid afib-flutter refractory to amiodarone, digoxin- now better after epi weaned Rv dysfunction requiring milrinone, low dose Norepi 2PI events in last 12 hrs, C0-ox 67 %  Good diuresis with lasix drip PI now > 7 despite diuresis- inc speed to 9000 Will wean NO, DC a-line and start ambulation Pain much better  creat up to 1.8, CVP 14 PICC ordered Cont antibiotics  LVAD INTERROGATION:  HeartMate II LVAD:  Flow 4.1 liters/min, speed 9000, power 4.3, PI 6.8.    Objective:    Vital Signs:   Temp:  [97.5 F (36.4 C)-100.4 F (38 C)] 97.7 F (36.5 C) (09/04 0700) Pulse Rate:  [25-137] 52 (09/04 0700) Resp:  [9-30] 20 (09/04 0700) BP: (85-95)/(71-79) 86/71 mmHg (09/04 0700) SpO2:  [97 %-100 %] 100 % (09/04 0700) Arterial Line BP: (83-133)/(72-98) 99/77 mmHg (09/04 0700) Weight:  [181 lb 7 oz (82.3 kg)] 181 lb 7 oz (82.3 kg) (09/04 0700) Last BM Date: 12/18/14 Mean arterial Pressure 90's  Intake/Output:   Intake/Output Summary (Last 24 hours) at 12/23/14 0726 Last data filed at 12/23/14 0700  Gross per 24 hour  Intake 2538.78 ml  Output   3625 ml  Net -1086.22 ml     Physical Exam: General:  Well appearing. No resp difficulty HEENT: normal Cor: distant heart sounds with LVAD hum present. Lungs: clear Abdomen: soft, nontender, nondistended. No hepatosplenomegaly. No bruits or masses. hypoactive bowel sounds. Extremities: no cyanosis, clubbing, rash, moderate edema Neuro: alert & orientedx3, cranial nerves grossly intact. moves all 4 extremities w/o difficulty. Affect pleasant  Telemetry: atrial flutter with RVR 130's  Labs: Basic Metabolic Panel:  Recent Labs Lab 12/20/14 1530 12/20/14 2005  12/21/14 0308 12/21/14 1601 12/21/14 1603 12/22/14 0331  12/22/14 1612 12/22/14 2153 12/23/14 0351  NA 132*  --   < > 133* 134*  --  132* 132* 132* 130*  K 4.4  --   < > 4.4 4.7  --  5.0 4.7 4.2 3.9  CL 102  --   < > 104 102  --  99* 99* 97* 95*  CO2 23  --   --  21*  --   --  22  --  23 25  GLUCOSE 124*  --   < > 107* 145*  --  124* 123* 142* 117*  BUN 21*  --   < > 15 17  --  19 27* 26* 28*  CREATININE 1.27* 1.29*  < > 1.18 1.20 1.29* 1.58* 1.80* 1.91* 1.87*  CALCIUM 8.4*  --   --  8.0*  --   --  8.8*  --  9.1 9.0  MG 2.3 2.5*  --  2.0  --  2.0 2.1  --   --  2.0  PHOS  --   --   --  4.5  --   --  5.4*  --   --  5.5*  < > = values in this interval not displayed.  Liver Function Tests:  Recent Labs Lab 12/18/14 0525 12/21/14 0308 12/22/14 0331 12/23/14 0351  AST 21 52* 61* 42*  ALT 24 18 20 20   ALKPHOS 99 43 63 65  BILITOT 1.4* 3.1* 3.4* 2.6*  PROT 7.4 5.1* 6.2* 6.7  ALBUMIN 2.9* 3.3* 3.2* 2.7*  No results for input(s): LIPASE, AMYLASE in the last 168 hours. No results for input(s): AMMONIA in the last 168 hours.  CBC:  Recent Labs Lab 12/20/14 2005  12/21/14 0308 12/21/14 0750 12/21/14 1601 12/21/14 1603 12/22/14 0331 12/22/14 1612 12/23/14 0351  WBC 9.4  --  8.5 9.5  --  14.5* 19.1*  --  16.6*  NEUTROABS 7.3  --  6.4  --   --   --  14.7*  --  13.5*  HGB 9.3*  < > REPEATED TO VERIFY 8.3* 11.9* 9.4* 9.5* 11.9* 8.8*  HCT 28.4*  < > 24.8* 25.5* 35.0* 28.8* 28.6* 35.0* 27.2*  MCV 65.4*  --  65.8* 65.9*  --  65.9* 64.7*  --  64.3*  PLT 134*  --  117* 121*  --  145* 168  --  242  < > = values in this interval not displayed.  INR:  Recent Labs Lab 12/20/14 1448 12/20/14 1459 12/21/14 0308 12/22/14 0331 12/23/14 0351  INR 1.36 1.49 1.54* 1.53* 1.38    Other results:  EKG:   Imaging: Dg Chest Port 1 View  12/22/2014   CLINICAL DATA:  LVAD.  EXAM: PORTABLE CHEST - 1 VIEW  COMPARISON:  12/21/2014  FINDINGS: Endotracheal tube has been removed. Left-sided AICD leads to the right ventricle. Right IJ Swan-Ganz  catheter tip to the right pulmonary artery. Right IJ central line tip to the superior vena cava. Status post median sternotomy. Left ventricular assist device overlies the cardiac apex.  Heart is enlarged and stable in appearance. Pulmonary vascular congestion without overt edema. No new consolidations.  IMPRESSION: Status post removal of endotracheal tube. Stable appearance of left ventricular assist device and other apparatus. Cardiomegaly and pulmonary vascular congestion.   Electronically Signed   By: Nolon Nations M.D.   On: 12/22/2014 09:23     Medications:     Scheduled Medications: . acetaminophen  1,000 mg Oral 4 times per day   Or  . acetaminophen (TYLENOL) oral liquid 160 mg/5 mL  1,000 mg Per Tube 4 times per day  . antiseptic oral rinse  7 mL Mouth Rinse QID  . aspirin EC  325 mg Oral Daily   Or  . aspirin  324 mg Per Tube Daily   Or  . aspirin  300 mg Rectal Daily  . bisacodyl  10 mg Oral Daily   Or  . bisacodyl  10 mg Rectal Daily  . chlorhexidine  15 mL Mouth/Throat BID  . Chlorhexidine Gluconate Cloth  6 each Topical Q0600  . docusate sodium  200 mg Oral Daily  . insulin aspart  0-24 Units Subcutaneous 6 times per day  . metoCLOPramide (REGLAN) injection  10 mg Intravenous 4 times per day  . pantoprazole  40 mg Oral Daily  . piperacillin-tazobactam (ZOSYN)  IV  3.375 g Intravenous Q8H  . potassium chloride  10 mEq Intravenous Q1 Hr x 2  . sodium chloride  3 mL Intravenous Q12H  . vancomycin  750 mg Intravenous Q12H  . warfarin  5 mg Oral q1800  . Warfarin - Physician Dosing Inpatient   Does not apply q1800    Infusions: . sodium chloride Stopped (12/22/14 0600)  . sodium chloride    . sodium chloride 20 mL/hr at 12/23/14 0700  . amiodarone    . furosemide (LASIX) infusion 15 mg/hr (12/23/14 0700)  . lactated ringers Stopped (12/20/14 2000)  . lactated ringers Stopped (12/22/14 0600)  . milrinone 0.375 mcg/kg/min (12/23/14 0700)  .  nitroGLYCERIN Stopped  (12/20/14 1625)  . norepinephrine (LEVOPHED) Adult infusion 2.027 mcg/min (12/23/14 0700)    PRN Medications: sodium chloride, hydrALAZINE, midazolam, morphine injection, ondansetron (ZOFRAN) IV, oxyCODONE, sodium chloride, traMADol   Assessment:   1. Acute/chonic systolic HF , NICM EF 0000000 with Cardiogenic shock on milrinone and levophed, IABP preoop, s/p HMII implant 12/20/2014 2. Chronic atrial fib 3. Postop atrial flutter with RVR 4. Expected EBL anemia 5. Fever early postop   Plan/Discussion:    He is hemodynamically  stable. VAD flow has improved with inc speed . HR much better off epi   Chest tube output decreased  DC anterior MT  Then ambulate  Expected acute blood loss anemia exacerbated by volume infusion. Continue to follow. Avoid transfusion if possible since he may be a transplant candidate.   He  some volume excess now. Will continue  diuretic.    start anticoagulation today slowly.Coumadin 5 mg today      I reviewed the LVAD parameters from today, and compared the results to the patient's prior recorded data.  No programming changes were made.  The LVAD is functioning within specified parameters.  The patient performs LVAD self-test daily.  LVAD interrogation was negative for any significant power changes, alarms or PI events/speed drops.  LVAD equipment check completed and is in good working order.  Back-up equipment present.   LVAD education done on emergency procedures and precautions and reviewed exit site care.  Length of Stay: [place] III 12/23/2014, 7:26 AM

## 2014-12-24 ENCOUNTER — Inpatient Hospital Stay (HOSPITAL_COMMUNITY): Payer: 59

## 2014-12-24 LAB — CBC WITH DIFFERENTIAL/PLATELET
BASOS PCT: 0 % (ref 0–1)
Basophils Absolute: 0 10*3/uL (ref 0.0–0.1)
EOS ABS: 0.1 10*3/uL (ref 0.0–0.7)
Eosinophils Relative: 1 % (ref 0–5)
HEMATOCRIT: 26.1 % — AB (ref 39.0–52.0)
HEMOGLOBIN: 8.7 g/dL — AB (ref 13.0–17.0)
LYMPHS PCT: 5 % — AB (ref 12–46)
Lymphs Abs: 0.7 10*3/uL (ref 0.7–4.0)
MCH: 21 pg — AB (ref 26.0–34.0)
MCHC: 33.3 g/dL (ref 30.0–36.0)
MCV: 62.9 fL — ABNORMAL LOW (ref 78.0–100.0)
MONOS PCT: 13 % — AB (ref 3–12)
Monocytes Absolute: 1.9 10*3/uL — ABNORMAL HIGH (ref 0.1–1.0)
NEUTROS ABS: 11.6 10*3/uL — AB (ref 1.7–7.7)
Neutrophils Relative %: 81 % — ABNORMAL HIGH (ref 43–77)
Platelets: 264 10*3/uL (ref 150–400)
RBC: 4.15 MIL/uL — ABNORMAL LOW (ref 4.22–5.81)
RDW: 16.8 % — ABNORMAL HIGH (ref 11.5–15.5)
WBC: 14.3 10*3/uL — ABNORMAL HIGH (ref 4.0–10.5)

## 2014-12-24 LAB — POCT I-STAT, CHEM 8
BUN: 38 mg/dL — ABNORMAL HIGH (ref 6–20)
CREATININE: 2 mg/dL — AB (ref 0.61–1.24)
Calcium, Ion: 1.16 mmol/L (ref 1.12–1.23)
Chloride: 89 mmol/L — ABNORMAL LOW (ref 101–111)
GLUCOSE: 119 mg/dL — AB (ref 65–99)
HCT: 36 % — ABNORMAL LOW (ref 39.0–52.0)
HEMOGLOBIN: 12.2 g/dL — AB (ref 13.0–17.0)
POTASSIUM: 3.6 mmol/L (ref 3.5–5.1)
Sodium: 130 mmol/L — ABNORMAL LOW (ref 135–145)
TCO2: 27 mmol/L (ref 0–100)

## 2014-12-24 LAB — GLUCOSE, CAPILLARY
GLUCOSE-CAPILLARY: 114 mg/dL — AB (ref 65–99)
GLUCOSE-CAPILLARY: 119 mg/dL — AB (ref 65–99)
GLUCOSE-CAPILLARY: 141 mg/dL — AB (ref 65–99)
Glucose-Capillary: 107 mg/dL — ABNORMAL HIGH (ref 65–99)

## 2014-12-24 LAB — MAGNESIUM: Magnesium: 2.1 mg/dL (ref 1.7–2.4)

## 2014-12-24 LAB — COMPREHENSIVE METABOLIC PANEL
ALT: 17 U/L (ref 17–63)
AST: 31 U/L (ref 15–41)
Albumin: 2.6 g/dL — ABNORMAL LOW (ref 3.5–5.0)
Alkaline Phosphatase: 62 U/L (ref 38–126)
Anion gap: 11 (ref 5–15)
BUN: 34 mg/dL — ABNORMAL HIGH (ref 6–20)
CHLORIDE: 90 mmol/L — AB (ref 101–111)
CO2: 29 mmol/L (ref 22–32)
CREATININE: 2.03 mg/dL — AB (ref 0.61–1.24)
Calcium: 9 mg/dL (ref 8.9–10.3)
GFR, EST AFRICAN AMERICAN: 44 mL/min — AB (ref >60)
GFR, EST NON AFRICAN AMERICAN: 38 mL/min — AB (ref >60)
Glucose, Bld: 120 mg/dL — ABNORMAL HIGH (ref 65–99)
POTASSIUM: 3.3 mmol/L — AB (ref 3.5–5.1)
Sodium: 130 mmol/L — ABNORMAL LOW (ref 135–145)
Total Bilirubin: 2.3 mg/dL — ABNORMAL HIGH (ref 0.3–1.2)
Total Protein: 6.4 g/dL — ABNORMAL LOW (ref 6.5–8.1)

## 2014-12-24 LAB — APTT: aPTT: 55 seconds — ABNORMAL HIGH (ref 24–37)

## 2014-12-24 LAB — PROTIME-INR
INR: 1.47 (ref 0.00–1.49)
Prothrombin Time: 17.9 seconds — ABNORMAL HIGH (ref 11.6–15.2)

## 2014-12-24 LAB — CARBOXYHEMOGLOBIN
CARBOXYHEMOGLOBIN: 1.3 % (ref 0.5–1.5)
METHEMOGLOBIN: 0.8 % (ref 0.0–1.5)
O2 Saturation: 67.6 %
TOTAL HEMOGLOBIN: 8.9 g/dL — AB (ref 13.5–18.0)

## 2014-12-24 LAB — LACTATE DEHYDROGENASE: LDH: 254 U/L — ABNORMAL HIGH (ref 98–192)

## 2014-12-24 LAB — PHOSPHORUS: Phosphorus: 5.6 mg/dL — ABNORMAL HIGH (ref 2.5–4.6)

## 2014-12-24 LAB — VANCOMYCIN, TROUGH: VANCOMYCIN TR: 39 ug/mL — AB (ref 10.0–20.0)

## 2014-12-24 MED ORDER — CITALOPRAM HYDROBROMIDE 20 MG PO TABS
20.0000 mg | ORAL_TABLET | Freq: Every day | ORAL | Status: DC
  Administered 2014-12-24 – 2015-01-08 (×16): 20 mg via ORAL
  Filled 2014-12-24 (×17): qty 1

## 2014-12-24 MED ORDER — HEPARIN (PORCINE) IN NACL 100-0.45 UNIT/ML-% IJ SOLN
800.0000 [IU]/h | INTRAMUSCULAR | Status: DC
  Administered 2014-12-24: 800 [IU]/h via INTRAVENOUS
  Filled 2014-12-24: qty 250

## 2014-12-24 MED ORDER — POTASSIUM CHLORIDE 10 MEQ/50ML IV SOLN
10.0000 meq | INTRAVENOUS | Status: AC
  Administered 2014-12-24 (×2): 10 meq via INTRAVENOUS
  Filled 2014-12-24 (×2): qty 50

## 2014-12-24 MED ORDER — SORBITOL 70 % SOLN
30.0000 mL | Freq: Once | Status: DC
  Filled 2014-12-24 (×2): qty 30

## 2014-12-24 MED ORDER — SORBITOL 70 % PO SOLN
30.0000 mL | Freq: Every morning | ORAL | Status: AC
  Administered 2014-12-24 – 2014-12-25 (×2): 30 mL via ORAL
  Filled 2014-12-24 (×4): qty 30

## 2014-12-24 MED ORDER — POTASSIUM CHLORIDE 10 MEQ/50ML IV SOLN
10.0000 meq | INTRAVENOUS | Status: AC
  Administered 2014-12-24 (×2): 10 meq via INTRAVENOUS
  Filled 2014-12-24: qty 50

## 2014-12-24 MED ORDER — POTASSIUM CHLORIDE 10 MEQ/50ML IV SOLN
10.0000 meq | INTRAVENOUS | Status: AC
  Administered 2014-12-24 (×3): 10 meq via INTRAVENOUS

## 2014-12-24 MED ORDER — AMIODARONE HCL 200 MG PO TABS
400.0000 mg | ORAL_TABLET | Freq: Two times a day (BID) | ORAL | Status: DC
  Administered 2014-12-24 (×2): 400 mg via ORAL
  Filled 2014-12-24 (×4): qty 2

## 2014-12-24 MED ORDER — WARFARIN SODIUM 7.5 MG PO TABS
7.5000 mg | ORAL_TABLET | Freq: Every day | ORAL | Status: DC
  Administered 2014-12-24: 7.5 mg via ORAL
  Filled 2014-12-24 (×2): qty 1

## 2014-12-24 MED ORDER — SODIUM CHLORIDE 0.9 % IV SOLN: 250.0000 mL | INTRAVENOUS | Status: DC

## 2014-12-24 MED ORDER — BISACODYL 10 MG RE SUPP
10.0000 mg | Freq: Once | RECTAL | Status: AC
  Administered 2014-12-24: 10 mg via RECTAL
  Filled 2014-12-24: qty 1

## 2014-12-24 NOTE — Progress Notes (Signed)
Patient ID: [name], male   DOB: 09-08-1970, 44 y.o.   MRN: [id number]  HeartMate 2 Rounding Note POD # 4  Subjective:   preop acute on chronc HF, cardiogenic shock, IABP Postop: Extubated pod1, developed rapid afib-flutter refractory to amiodarone, digoxin- now better after epi weaned Rv dysfunction requiring milrinone, low dose Norepi now off 1PI events in last 24 hrs, C0-ox 67 %  Good diuresis with lasix drip, creat up > 2 so will reduce 15 to 10 speed to 9000 with good parameters, CVP Patient walked 70 ft Pain much better PICC in place Cont antibiotic HR 110-120, convert to po amio  INR not increasing- will start heparin bridge as chest tube drainage low DC L PT  LVAD INTERROGATION:  HeartMate II LVAD:  Flow 4.4 liters/min, speed 9000, power 4.3, PI 5.8.    Objective:    Vital Signs:   Temp:  [97 F (36.1 C)-98 F (36.7 C)] 97.8 F (36.6 C) (09/05 0000) Pulse Rate:  [25-134] 116 (09/05 0700) Resp:  [10-26] 20 (09/05 0700) BP: (85-94)/(74-76) 94/76 mmHg (09/04 1100) SpO2:  [96 %-100 %] 100 % (09/05 0700) Arterial Line BP: (85-94)/(74-76) 94/76 mmHg (09/04 1100) Weight:  [179 lb 3.7 oz (81.3 kg)] 179 lb 3.7 oz (81.3 kg) (09/05 0645) Last BM Date: 12/18/14 Mean arterial Pressure 80's  Intake/Output:   Intake/Output Summary (Last 24 hours) at 12/24/14 0812 Last data filed at 12/24/14 0700  Gross per 24 hour  Intake 2546.3 ml  Output   3785 ml  Net -1238.7 ml     Physical Exam: General:  Well appearing. No resp difficulty HEENT: normal Cor: distant heart sounds with LVAD hum present. Lungs: clear Abdomen: soft, nontender, nondistended. No hepatosplenomegaly. No bruits or masses. hypoactive bowel sounds. Extremities: no cyanosis, clubbing, rash, moderate edema Neuro: alert & orientedx3, cranial nerves grossly intact. moves all 4 extremities w/o difficulty. Affect pleasant  Telemetry: atrial flutter with RVR 130's  Labs: Basic Metabolic  Panel:  Recent Labs Lab 12/21/14 0308  12/21/14 1603 12/22/14 0331 12/22/14 1612 12/22/14 2153 12/23/14 0351 12/23/14 1457 12/24/14 0334  NA 133*  < >  --  132* 132* 132* 130* 130* 130*  K 4.4  < >  --  5.0 4.7 4.2 3.9 3.8 3.3*  CL 104  < >  --  99* 99* 97* 95* 91* 90*  CO2 21*  --   --  22  --  23 25  --  29  GLUCOSE 107*  < >  --  124* 123* 142* 117* 118* 120*  BUN 15  < >  --  19 27* 26* 28* 33* 34*  CREATININE 1.18  < > 1.29* 1.58* 1.80* 1.91* 1.87* 2.00* 2.03*  CALCIUM 8.0*  --   --  8.8*  --  9.1 9.0  --  9.0  MG 2.0  --  2.0 2.1  --   --  2.0  --  2.1  PHOS 4.5  --   --  5.4*  --   --  5.5*  --  5.6*  < > = values in this interval not displayed.  Liver Function Tests:  Recent Labs Lab 12/18/14 0525 12/21/14 0308 12/22/14 0331 12/23/14 0351 12/24/14 0334  AST 21 52* 61* 42* 31  ALT 24 18 20 20 17   ALKPHOS 99 43 63 65 62  BILITOT 1.4* 3.1* 3.4* 2.6* 2.3*  PROT 7.4 5.1* 6.2* 6.7 6.4*  ALBUMIN 2.9* 3.3* 3.2* 2.7* 2.6*   No  results for input(s): LIPASE, AMYLASE in the last 168 hours. No results for input(s): AMMONIA in the last 168 hours.  CBC:  Recent Labs Lab 12/20/14 2005  12/21/14 0308 12/21/14 0750  12/21/14 1603 12/22/14 0331 12/22/14 1612 12/23/14 0351 12/23/14 1457 12/24/14 0334  WBC 9.4  --  8.5 9.5  --  14.5* 19.1*  --  16.6*  --  14.3*  NEUTROABS 7.3  --  6.4  --   --   --  14.7*  --  13.5*  --  11.6*  HGB 9.3*  < > REPEATED TO VERIFY 8.3*  < > 9.4* 9.5* 11.9* 8.8* 11.6* 8.7*  HCT 28.4*  < > 24.8* 25.5*  < > 28.8* 28.6* 35.0* 27.2* 34.0* 26.1*  MCV 65.4*  --  65.8* 65.9*  --  65.9* 64.7*  --  64.3*  --  62.9*  PLT 134*  --  117* 121*  --  145* 168  --  242  --  264  < > = values in this interval not displayed.  INR:  Recent Labs Lab 12/20/14 1459 12/21/14 0308 12/22/14 0331 12/23/14 0351 12/24/14 0334  INR 1.49 1.54* 1.53* 1.38 1.47    Other results:  EKG:   Imaging: Dg Chest Port 1 View  12/23/2014   CLINICAL DATA:   44 year old male with history of left ventricular assist assist device. Status post PICC placement. Evaluate position of PICC.  EXAM: PORTABLE CHEST - 1 VIEW  COMPARISON:  Multiple priors, most recently 12/23/2014.  FINDINGS: Interval placement of a right upper extremity PICC with tip terminating in the right atrium approximately 3 cm distal to the superior cavoatrial junction. There is a right-sided internal jugular central venous catheter with tip terminating in the superior cavoatrial junction. Additional right IJ Cordis with tip terminating in the proximal superior vena cava. Left-sided pacemaker/AICD with lead tip projecting over the expected location of the right ventricular apex. Inflow cannula for left ventricular assist device appears cranially angulated, but similar to prior studies. Left-sided chest tube in position with tip and side port projecting over the left hemithorax. Lung volumes are low. No consolidative airspace disease. Minimal bibasilar (left greater than right), subsegmental atelectasis. Trace left pleural effusion. No pneumothorax. Congestion of the pulmonary vasculature, without frank pulmonary edema. Mild enlargement of the cardiopericardial silhouette is similar to the prior study. The patient is rotated to the left on today's exam, resulting in distortion of the mediastinal contours and reduced diagnostic sensitivity and specificity for mediastinal pathology.  IMPRESSION: 1. Postoperative changes and support apparatus, as above. Tip of new right PICC is in the right atrium approximately 3 cm distal to the superior cavoatrial junction. 2. Mild cardiomegaly with pulmonary venous congestion, but no frank pulmonary edema.   Electronically Signed   By: Vinnie Langton M.D.   On: 12/23/2014 13:32   Dg Chest Port 1 View  12/23/2014   CLINICAL DATA:  Shortness of breath.  EXAM: PORTABLE CHEST - 1 VIEW  COMPARISON:  Yesterday.  FINDINGS: The right jugular Swan-Ganz catheter has been removed  and the sheath remains in place. Additional right jugular catheter tip remains at the superior cavoatrial junction. Stable left subclavian AICD lead, enlarged cardiac silhouette and median sternotomy wires. Mediastinal and left chest tubes remain in place with no pneumothorax seen. Stable poor inspiration with crowding of the lung markings and mildly prominent vasculature. The left ventricular assist device is unchanged.  IMPRESSION: Stable cardiomegaly and pulmonary vascular congestion.   Electronically Signed  By: Claudie Revering M.D.   On: 12/23/2014 09:34     Medications:     Scheduled Medications: . acetaminophen  1,000 mg Oral 4 times per day   Or  . acetaminophen (TYLENOL) oral liquid 160 mg/5 mL  1,000 mg Per Tube 4 times per day  . amiodarone  400 mg Oral BID  . antiseptic oral rinse  7 mL Mouth Rinse QID  . aspirin EC  325 mg Oral Daily   Or  . aspirin  324 mg Per Tube Daily   Or  . aspirin  300 mg Rectal Daily  . bisacodyl  10 mg Oral Daily   Or  . bisacodyl  10 mg Rectal Daily  . citalopram  20 mg Oral Daily  . docusate sodium  200 mg Oral Daily  . insulin aspart  0-24 Units Subcutaneous 6 times per day  . metoCLOPramide (REGLAN) injection  10 mg Intravenous 4 times per day  . pantoprazole  40 mg Oral Daily  . piperacillin-tazobactam (ZOSYN)  IV  3.375 g Intravenous Q8H  . potassium chloride  10 mEq Intravenous Q1 Hr x 2  . potassium chloride  10 mEq Intravenous Q1 Hr x 3  . sodium chloride  10-40 mL Intracatheter Q12H  . sodium chloride  3 mL Intravenous Q12H  . sorbitol  30 mL Oral q morning - 10a  . vancomycin  750 mg Intravenous Q12H  . warfarin  7.5 mg Oral q1800  . Warfarin - Physician Dosing Inpatient   Does not apply q1800    Infusions: . sodium chloride Stopped (12/22/14 0600)  . sodium chloride    . sodium chloride 20 mL/hr at 12/24/14 0700  . furosemide (LASIX) infusion 10 mg/hr (12/24/14 0745)  . heparin    . lactated ringers Stopped (12/20/14 2000)   . lactated ringers Stopped (12/22/14 0600)  . milrinone 0.25 mcg/kg/min (12/24/14 0803)  . norepinephrine (LEVOPHED) Adult infusion Stopped (12/23/14 2100)    PRN Medications: sodium chloride, hydrALAZINE, midazolam, morphine injection, ondansetron (ZOFRAN) IV, oxyCODONE, sodium chloride, sodium chloride, traMADol   Assessment:   1. Acute/chonic systolic HF , NICM EF 0000000 with Cardiogenic shock on milrinone and levophed, IABP preoop, s/p HMII implant 12/20/2014 2. Chronic atrial fib 3. Postop atrial flutter with RVR 4. Expected EBL anemia 5. Fever early postop- resolved   Plan/Discussion:    He is hemodynamically  stable. VAD flow has improved with inc speed . HR much better off epi   Chest tube output decreased  DCL PT  BID  ambulate  Expected acute blood loss anemia exacerbated by volume infusion. Continue to follow. Avoid transfusion if possible since he may be a transplant candidate.   He  some volume excess now. Will continue  diuretic. IV lasix at 10   .Coumadin  7. 5 mg today with low dose IV lasix     I reviewed the LVAD parameters from today, and compared the results to the patient's prior recorded data.  No programming changes were made.  The LVAD is functioning within specified parameters.  The patient performs LVAD self-test daily.  LVAD interrogation was negative for any significant power changes, alarms or PI events/speed drops.  LVAD equipment check completed and is in good working order.  Back-up equipment present.   LVAD education done on emergency procedures and precautions and reviewed exit site care.  Length of Stay: [place] 12/24/2014, 8:12 AM

## 2014-12-24 NOTE — Progress Notes (Signed)
Dr. Haroldine Laws paged concerning K of 3.6, nausea, vomiting, still no BM; orders to give 2 runs of K, KUB, sorbitol, suppository and enema for later if still no BM.  Rowe Pavy, RN

## 2014-12-24 NOTE — Progress Notes (Signed)
VAD Team Rounding Note  Subjective:    s/p HMII implant 9/1  Off levophed. Milrinone down to 0.2. Good diuresis. CVP 12. VAD at 9000   Feeling a bit better. HR 110-120. Walked 70 feet. No BM. Feels constipated.   Co-ox 68% Cr 1.5-> 1.9 -> 1.8-> 2.0   LVAD INTERROGATION:  HeartMate II LVAD:  Flow 3.9 liters/min, speed 9000, power 4.7,  PI 6.8 several PI events   Objective:    Vital Signs:   Temp:  [97 F (36.1 C)-98 F (36.7 C)] 97.8 F (36.6 C) (09/05 0800) Pulse Rate:  [25-134] 55 (09/05 0800) Resp:  [10-26] 20 (09/05 0800) BP: (85-94)/(74-76) 94/76 mmHg (09/04 1100) SpO2:  [96 %-100 %] 100 % (09/05 0800) Arterial Line BP: (85-94)/(74-76) 94/76 mmHg (09/04 1100) Weight:  [81.3 kg (179 lb 3.7 oz)] 81.3 kg (179 lb 3.7 oz) (09/05 0645) Last BM Date: 12/18/14 Mean arterial Pressure 80s  Intake/Output:   Intake/Output Summary (Last 24 hours) at 12/24/14 0857 Last data filed at 12/24/14 0800  Gross per 24 hour  Intake 2705.55 ml  Output   3970 ml  Net -1264.45 ml     Physical Exam: General:lying in bed NAD HEENT: normal  Neck: supple.  Cor Sternal dressing in place. + CTs: Mechanical heart sounds with LVAD hum present. Lungs:+ rhonchi Abdomen: soft, nontender, +distended. hypoactive bowel sounds. Driveline: C/D/I; securement device intact and driveline incorporated Extremities: no cyanosis, clubbing, rash, tr edema Neuro: awake conversant  Telemetry: AF 110-120s  Labs: Basic Metabolic Panel:  Recent Labs Lab 12/21/14 0308  12/21/14 1603 12/22/14 0331 12/22/14 1612 12/22/14 2153 12/23/14 0351 12/23/14 1457 12/24/14 0334  NA 133*  < >  --  132* 132* 132* 130* 130* 130*  K 4.4  < >  --  5.0 4.7 4.2 3.9 3.8 3.3*  CL 104  < >  --  99* 99* 97* 95* 91* 90*  CO2 21*  --   --  22  --  23 25  --  29  GLUCOSE 107*  < >  --  124* 123* 142* 117* 118* 120*  BUN 15  < >  --  19 27* 26* 28* 33* 34*  CREATININE 1.18  < > 1.29* 1.58* 1.80* 1.91* 1.87*  2.00* 2.03*  CALCIUM 8.0*  --   --  8.8*  --  9.1 9.0  --  9.0  MG 2.0  --  2.0 2.1  --   --  2.0  --  2.1  PHOS 4.5  --   --  5.4*  --   --  5.5*  --  5.6*  < > = values in this interval not displayed.  Liver Function Tests:  Recent Labs Lab 12/18/14 0525 12/21/14 0308 12/22/14 0331 12/23/14 0351 12/24/14 0334  AST 21 52* 61* 42* 31  ALT 24 18 20 20 17   ALKPHOS 99 43 63 65 62  BILITOT 1.4* 3.1* 3.4* 2.6* 2.3*  PROT 7.4 5.1* 6.2* 6.7 6.4*  ALBUMIN 2.9* 3.3* 3.2* 2.7* 2.6*   No results for input(s): LIPASE, AMYLASE in the last 168 hours. No results for input(s): AMMONIA in the last 168 hours.  CBC:  Recent Labs Lab 12/20/14 2005  12/21/14 0308 12/21/14 0750  12/21/14 1603 12/22/14 0331 12/22/14 1612 12/23/14 0351 12/23/14 1457 12/24/14 0334  WBC 9.4  --  8.5 9.5  --  14.5* 19.1*  --  16.6*  --  14.3*  NEUTROABS 7.3  --  6.4  --   --   --  14.7*  --  13.5*  --  11.6*  HGB 9.3*  < > REPEATED TO VERIFY 8.3*  < > 9.4* 9.5* 11.9* 8.8* 11.6* 8.7*  HCT 28.4*  < > 24.8* 25.5*  < > 28.8* 28.6* 35.0* 27.2* 34.0* 26.1*  MCV 65.4*  --  65.8* 65.9*  --  65.9* 64.7*  --  64.3*  --  62.9*  PLT 134*  --  117* 121*  --  145* 168  --  242  --  264  < > = values in this interval not displayed.  INR:  Recent Labs Lab 12/20/14 1459 12/21/14 0308 12/22/14 0331 12/23/14 0351 12/24/14 0334  INR 1.49 1.54* 1.53* 1.38 1.47    Other results:   Imaging: Dg Chest Port 1 View  12/24/2014   CLINICAL DATA:  Cardiogenic shock, acute on chronic heart failure and renal failure. Left ventricular assist device.  EXAM: PORTABLE CHEST - 1 VIEW  COMPARISON:  12/23/2014  FINDINGS: Cardiomegaly, mild point vascular congestion, left ventricular assist device, left AICD, right IJ Cordis, right IJ central venous catheter with tip overlying the superior cavoatrial junction and right PICC line with tip overlying the superior vena cava junction, and 2 left thoracostomy tubes again noted.  There is no  evidence of pneumothorax.  There has been little interval change since the prior study with mild left basilar atelectasis.  IMPRESSION: Little interval change since the prior study with continued mild pulmonary vascular congestion and mild left basilar atelectasis.   Electronically Signed   By: Margarette Canada M.D.   On: 12/24/2014 08:19   Dg Chest Port 1 View  12/23/2014   CLINICAL DATA:  44 year old male with history of left ventricular assist assist device. Status post PICC placement. Evaluate position of PICC.  EXAM: PORTABLE CHEST - 1 VIEW  COMPARISON:  Multiple priors, most recently 12/23/2014.  FINDINGS: Interval placement of a right upper extremity PICC with tip terminating in the right atrium approximately 3 cm distal to the superior cavoatrial junction. There is a right-sided internal jugular central venous catheter with tip terminating in the superior cavoatrial junction. Additional right IJ Cordis with tip terminating in the proximal superior vena cava. Left-sided pacemaker/AICD with lead tip projecting over the expected location of the right ventricular apex. Inflow cannula for left ventricular assist device appears cranially angulated, but similar to prior studies. Left-sided chest tube in position with tip and side port projecting over the left hemithorax. Lung volumes are low. No consolidative airspace disease. Minimal bibasilar (left greater than right), subsegmental atelectasis. Trace left pleural effusion. No pneumothorax. Congestion of the pulmonary vasculature, without frank pulmonary edema. Mild enlargement of the cardiopericardial silhouette is similar to the prior study. The patient is rotated to the left on today's exam, resulting in distortion of the mediastinal contours and reduced diagnostic sensitivity and specificity for mediastinal pathology.  IMPRESSION: 1. Postoperative changes and support apparatus, as above. Tip of new right PICC is in the right atrium approximately 3 cm distal to  the superior cavoatrial junction. 2. Mild cardiomegaly with pulmonary venous congestion, but no frank pulmonary edema.   Electronically Signed   By: Vinnie Langton M.D.   On: 12/23/2014 13:32   Dg Chest Port 1 View  12/23/2014   CLINICAL DATA:  Shortness of breath.  EXAM: PORTABLE CHEST - 1 VIEW  COMPARISON:  Yesterday.  FINDINGS: The right jugular Swan-Ganz catheter has been removed  and the sheath remains in place. Additional right jugular catheter tip remains at the superior cavoatrial junction. Stable left subclavian AICD lead, enlarged cardiac silhouette and median sternotomy wires. Mediastinal and left chest tubes remain in place with no pneumothorax seen. Stable poor inspiration with crowding of the lung markings and mildly prominent vasculature. The left ventricular assist device is unchanged.  IMPRESSION: Stable cardiomegaly and pulmonary vascular congestion.   Electronically Signed   By: Claudie Revering M.D.   On: 12/23/2014 09:34     Medications:     Scheduled Medications: . acetaminophen  1,000 mg Oral 4 times per day   Or  . acetaminophen (TYLENOL) oral liquid 160 mg/5 mL  1,000 mg Per Tube 4 times per day  . amiodarone  400 mg Oral BID  . antiseptic oral rinse  7 mL Mouth Rinse QID  . aspirin EC  325 mg Oral Daily   Or  . aspirin  324 mg Per Tube Daily   Or  . aspirin  300 mg Rectal Daily  . bisacodyl  10 mg Oral Daily   Or  . bisacodyl  10 mg Rectal Daily  . citalopram  20 mg Oral Daily  . docusate sodium  200 mg Oral Daily  . insulin aspart  0-24 Units Subcutaneous 6 times per day  . metoCLOPramide (REGLAN) injection  10 mg Intravenous 4 times per day  . pantoprazole  40 mg Oral Daily  . piperacillin-tazobactam (ZOSYN)  IV  3.375 g Intravenous Q8H  . potassium chloride  10 mEq Intravenous Q1 Hr x 3  . sodium chloride  10-40 mL Intracatheter Q12H  . sodium chloride  3 mL Intravenous Q12H  . sorbitol  30 mL Oral q morning - 10a  . vancomycin  750 mg Intravenous Q12H  .  warfarin  7.5 mg Oral q1800  . Warfarin - Physician Dosing Inpatient   Does not apply q1800    Infusions: . sodium chloride Stopped (12/22/14 0600)  . sodium chloride    . sodium chloride 20 mL/hr at 12/24/14 0800  . furosemide (LASIX) infusion 10 mg/hr (12/24/14 0800)  . heparin    . lactated ringers Stopped (12/20/14 2000)  . lactated ringers Stopped (12/22/14 0600)  . milrinone 0.25 mcg/kg/min (12/24/14 0803)  . norepinephrine (LEVOPHED) Adult infusion Stopped (12/23/14 2100)    PRN Medications: sodium chloride, hydrALAZINE, midazolam, morphine injection, ondansetron (ZOFRAN) IV, oxyCODONE, sodium chloride, sodium chloride, traMADol   Assessment:   1. Cardiogenic shock s/p HMII implant 12/20/2014 2. Acute/chonic systolic HF   -- NICM EF 0000000 3. Chronic AF with RVR 4. Expected EBL anemia 5. Acute on chronic renal failure 6. PNA   Plan/Discussion:     Making progress. Suspect he had component of ATN. Now improving. Diuresing well with lasix gtt. Will switch to po demadex soon.  Co-ox looks great. Weaning milrinone slwoly. VAD speed now at 9000. Can adjust as needed   AF rate remains fast. Amio switched to po. Can use low-dose b-blocker as needed.   Continue Vanc and Zosyn for probable PNA. WBC improving.   No BM yet. On Reglan now. Dr. Prescott Gum ordered sorbitol  Coumadin started yesterday. Heparin started today   I reviewed the LVAD parameters from today, and compared the results to the patient's prior recorded data.  No programming changes were made.  The LVAD is functioning within specified parameters.  The patient performs LVAD self-test daily.  LVAD interrogation was negative for any significant power changes, alarms or  PI events/speed drops.  LVAD equipment check completed and is in good working order.  Back-up equipment present.   LVAD education done on emergency procedures and precautions and reviewed exit site care.  Length of Stay: 10  Glori Bickers MD 12/24/2014, 8:57 AM  VAD Team --- VAD ISSUES ONLY--- Pager [phone] (7am - 7am)  Advanced Heart Failure Team  Pager [phone] (M-F; 7a - 4p)  Please contact [facility] for night-coverage after hours (4p -7a ) and weekends on [email or web address]

## 2014-12-24 NOTE — Progress Notes (Signed)
ANTIBIOTIC CONSULT NOTE - FOLLOW UP  Pharmacy Consult for vancomycin Indication: pneumonia  No Known Allergies  Patient Measurements: Height: 5\' 7"  (170.2 cm) Weight: 179 lb 3.7 oz (81.3 kg) IBW/kg (Calculated) : 66.1 Adjusted Body Weight:   Vital Signs: Temp: 97.4 F (36.3 C) (09/05 2015) Temp Source: Oral (09/05 2015) Pulse Rate: 42 (09/05 2000) Intake/Output from previous day: 09/04 0701 - 09/05 0700 In: 3095.3 [P.O.:480; I.V.:2015.3; IV Piggyback:600] Out: 3995 [Urine:3815; Chest Tube:180] Intake/Output from this shift: Total I/O In: 53.9 [I.V.:53.9] Out: 50 [Urine:50]  Labs:  Recent Labs  12/22/14 0331  12/23/14 0351 12/23/14 1457 12/24/14 0334 12/24/14 1608  WBC 19.1*  --  16.6*  --  14.3*  --   HGB 9.5*  < > 8.8* 11.6* 8.7* 12.2*  PLT 168  --  242  --  264  --   CREATININE 1.58*  < > 1.87* 2.00* 2.03* 2.00*  < > = values in this interval not displayed. Estimated Creatinine Clearance: 47.1 mL/min (by C-G formula based on Cr of 2).  Recent Labs  12/24/14 2042  [place] 85*     Microbiology: Recent Results (from the past 720 hour(s))  Urine culture     Status: None   Collection Time: 11/30/14  3:32 PM  Result Value Ref Range Status   Specimen Description URINE, CLEAN CATCH  Final   Special Requests NONE  Final   Culture NO GROWTH 1 DAY  Final   Report Status 12/01/2014 FINAL  Final  MRSA PCR Screening     Status: None   Collection Time: 11/30/14  6:46 PM  Result Value Ref Range Status   MRSA by PCR NEGATIVE NEGATIVE Final    Comment:        The GeneXpert MRSA Assay (FDA approved for NASAL specimens only), is one component of a comprehensive MRSA colonization surveillance program. It is not intended to diagnose MRSA infection nor to guide or monitor treatment for MRSA infections.   MRSA PCR Screening     Status: None   Collection Time: 12/14/14  3:20 PM  Result Value Ref Range Status   MRSA by PCR NEGATIVE NEGATIVE Final    Comment:         The GeneXpert MRSA Assay (FDA approved for NASAL specimens only), is one component of a comprehensive MRSA colonization surveillance program. It is not intended to diagnose MRSA infection nor to guide or monitor treatment for MRSA infections.   Surgical pcr screen     Status: Abnormal   Collection Time: 12/15/14  4:34 PM  Result Value Ref Range Status   MRSA, PCR NEGATIVE NEGATIVE Final   Staphylococcus aureus POSITIVE (A) NEGATIVE Final    Comment:        The Xpert SA Assay (FDA approved for NASAL specimens in patients over 36 years of age), is one component of a comprehensive surveillance program.  Test performance has been validated by [facility] for patients greater than or equal to 8 year old. It is not intended to diagnose infection nor to guide or monitor treatment.   Surgical pcr screen     Status: Abnormal   Collection Time: 12/17/14 11:26 AM  Result Value Ref Range Status   MRSA, PCR NEGATIVE NEGATIVE Final   Staphylococcus aureus POSITIVE (A) NEGATIVE Final    Comment:        The Xpert SA Assay (FDA approved for NASAL specimens in patients over 14 years of age), is one component of a comprehensive surveillance program.  Test performance has been validated by [facility] for patients greater than or equal to 27 year old. It is not intended to diagnose infection nor to guide or monitor treatment.     Anti-infectives    Start     Dose/Rate Route Frequency Ordered Stop   12/22/14 2200  vancomycin (VANCOCIN) IVPB 750 mg/150 ml premix  Status:  Discontinued     750 mg 150 mL/hr over 60 Minutes Intravenous Every 12 hours 12/22/14 1137 12/24/14 2127   12/22/14 1100  piperacillin-tazobactam (ZOSYN) IVPB 3.375 g     3.375 g 12.5 mL/hr over 240 Minutes Intravenous Every 8 hours 12/22/14 1047     12/22/14 1000  vancomycin (VANCOCIN) IVPB 1000 mg/200 mL premix  Status:  Discontinued     1,000 mg 200 mL/hr over 60 Minutes Intravenous Every 12 hours  12/22/14 0856 12/22/14 1137   12/21/14 0800  rifampin (RIFADIN) capsule 600 mg     600 mg Oral  Once 12/20/14 1447 12/21/14 0737   12/21/14 0600  fluconazole (DIFLUCAN) IVPB 400 mg     400 mg 100 mL/hr over 120 Minutes Intravenous  Once 12/20/14 1447 12/21/14 0711   12/20/14 1930  vancomycin (VANCOCIN) IVPB 1000 mg/200 mL premix     1,000 mg 200 mL/hr over 60 Minutes Intravenous Every 12 hours 12/20/14 1447 12/21/14 1934   12/20/14 1845  cefUROXime (ZINACEF) 1.5 g in dextrose 5 % 50 mL IVPB     1.5 g 100 mL/hr over 30 Minutes Intravenous Every 12 hours 12/20/14 1447 12/22/14 0619   12/20/14 1124  vancomycin (VANCOCIN) 1,000 mg in sodium chloride 0.9 % 1,000 mL irrigation  Status:  Discontinued       As needed 12/20/14 1124 12/20/14 1418   12/20/14 0745  vancomycin (VANCOCIN) powder 2,000 mg  Status:  Discontinued     2,000 mg Other To Surgery 12/20/14 0736 12/20/14 1422   12/20/14 0745  vancomycin (VANCOCIN) 1,000 mg in sodium chloride 0.9 % 1,000 mL irrigation  Status:  Discontinued      Irrigation To Surgery 12/20/14 0736 12/20/14 1422   12/20/14 0500  rifampin (RIFADIN) 600 mg in sodium chloride 0.9 % 100 mL IVPB  Status:  Discontinued     600 mg 200 mL/hr over 30 Minutes Intravenous To Surgery 12/19/14 1707 12/19/14 1716   12/20/14 0400  vancomycin (VANCOCIN) 1,250 mg in sodium chloride 0.9 % 250 mL IVPB  Status:  Discontinued     1,250 mg 166.7 mL/hr over 90 Minutes Intravenous To Surgery 12/19/14 1707 12/20/14 1422   12/20/14 0400  cefUROXime (ZINACEF) 1.5 g in dextrose 5 % 50 mL IVPB     1.5 g 100 mL/hr over 30 Minutes Intravenous To Surgery 12/19/14 1707 12/20/14 1211   12/20/14 0400  cefUROXime (ZINACEF) 750 mg in dextrose 5 % 50 mL IVPB  Status:  Discontinued     750 mg 100 mL/hr over 30 Minutes Intravenous To Surgery 12/19/14 1706 12/20/14 1422   12/20/14 0400  fluconazole (DIFLUCAN) IVPB 400 mg     400 mg 100 mL/hr over 120 Minutes Intravenous To Surgery 12/19/14 1707  12/20/14 0845   12/20/14 0400  rifampin (RIFADIN) 600 mg in sodium chloride 0.9 % 100 mL IVPB     600 mg 200 mL/hr over 30 Minutes Intravenous To Surgery 12/19/14 1707 12/20/14 0910   12/20/14 0400  vancomycin (VANCOCIN) powder 1,000 mg     1,000 mg Other To Surgery 12/19/14 1706 12/20/14 1050  Assessment: 44 yo male with HCAP is currently on supratherapeutic vancomycin.  Vancomycin trough was 39.    Goal of Therapy:  Vancomycin trough level 15-20 mcg/ml  Plan:  - hold vancomycin for now - redraw vancomycin random level tomorrow at 2100 to reassess dosing  Charmane Protzman, [name] 12/24/2014,9:28 PM

## 2014-12-24 NOTE — Plan of Care (Signed)
Day 5 of CHG cloths completed on 9/2.

## 2014-12-24 NOTE — Progress Notes (Signed)
Occupational Therapy Treatment Patient Details Name: [name] MRN: [id number] DOB: [birth year] Today's Date: 12/24/2014    History of present illness Patient is a 44 yo male admitted 12/14/14 with heart failure.  Patient s/p LVAD on 12/20/14.  PMH:  NICM, EF 20-25%, CHF, ICD, Afib, HTN, severe MR/TR, RA   OT comments  This 44 yo male admitted and underwent above presents to acute OT with making progress today from eval 2 days ago. He will continue to benefit from acute OT with follow up [place] for progression of independence s/p LVAD.  Follow Up Recommendations  Home health OT    Equipment Recommendations  3 in 1 bedside comode (rollator)       Precautions / Restrictions Precautions Precautions: Sternal;Fall Precaution Comments: LVAD, chest tube Restrictions Weight Bearing Restrictions: Yes Other Position/Activity Restrictions: Sternal precautions       Mobility Bed Mobility Overal bed mobility: Needs Assistance Bed Mobility: Supine to Sit     Supine to sit: Min assist;+2 for safety/equipment;HOB elevated     General bed mobility comments: Cues to move legs to the EOB and then use them to anchor himself to help scoot himself around more  Transfers Overall transfer level: Needs assistance Equipment used: 2 person hand held assist Transfers: Sit to/from Stand Sit to Stand: Mod assist;+2 physical assistance;From elevated surface              Balance Overall balance assessment: Needs assistance Sitting-balance support: Feet supported;No upper extremity supported Sitting balance-Leahy Scale: Fair     Standing balance support: Bilateral upper extremity supported Standing balance-Leahy Scale: Poor                           Vision                 Additional Comments: No change from baseline          Cognition   Behavior During Therapy: WFL for tasks assessed/performed Overall Cognitive Status: Within Functional Limits for tasks assessed                          Exercises Other Exercises Other Exercises: Pt's first attempt with therapy to change from main power source to battery power. Pt had difficulty today unscrewing and reconneting lines. Pt able with verbal cues to get red arrows lined up to put batteries in clips. He knew how to check each battery for power. Pt currently total A for getting batteries in vest and control in holder due to weakness and multiple lines/tubes           Pertinent Vitals/ Pain       Pain Assessment: 0-10 Pain Score: 6  Pain Location: right side where chest tube had be D/C'd Pain Descriptors / Indicators: Sore Pain Intervention(s): Monitored during session (RN aware)         Frequency Min 3X/week     Progress Toward Goals  OT Goals(current goals can now be found in the care plan section)  Progress towards OT goals: Progressing toward goals     Plan Discharge plan remains appropriate    Co-evaluation    PT/OT/SLP Co-Evaluation/Treatment: Yes Reason for Co-Treatment: Complexity of the patient's impairments (multi-system involvement);For patient/therapist safety          End of Session Equipment Utilized During Treatment:  (pushed W/C)   Activity Tolerance Patient tolerated treatment well   Patient Left in chair;with call  bell/phone within reach;with family/visitor present   Nurse Communication  (RN helped with pushing recliner behind pt for [place])        Time: 1010-1039 OT Time Calculation (min): 29 min  Charges: OT General Charges $OT Visit: 1 Procedure OT Treatments $Therapeutic Activity: 8-22 mins  [name] [id number] 12/24/2014, 11:28 AM

## 2014-12-24 NOTE — Progress Notes (Signed)
Mr. Monjaraz is in better spirits. He says he has been out of bed and is feeling a little better. He says pain medication has been helping. Provided encouragement and support. He has no specific concerns at this time.   Vinie Sill, NP Palliative Medicine Team Pager # [phone] (M-F 8a-5p) Team Phone # [phone] (Nights/Weekends)

## 2014-12-24 NOTE — Progress Notes (Signed)
Physical Therapy Treatment Patient Details Name: [name] MRN: [id number] DOB: 12/30/1970 Today's Date: 12/24/2014    History of Present Illness Patient is a 44 yo male admitted 12/14/14 with heart failure.  Patient s/p LVAD on 12/20/14.  PMH:  NICM, EF 20-25%, CHF, ICD, Afib, HTN, severe MR/TR, RA    PT Comments    Patient seen by PT/OT for progression of mobility and LVAD training. Patient able to ambulate on 3 liters O2 in hall with min assist (increased assist for line management). Patient with initiation of power conversion with cues. Will continue to see and progress as tolerated.  Follow Up Recommendations  Home health PT;Supervision/Assistance - 24 hour     Equipment Recommendations  Rolling walker with 5" wheels    Recommendations for Other Services       Precautions / Restrictions Precautions Precautions: Sternal;Fall Precaution Comments: LVAD, chest tube Restrictions Weight Bearing Restrictions: Yes Other Position/Activity Restrictions: Sternal precautions    Mobility  Bed Mobility Overal bed mobility: Needs Assistance Bed Mobility: Supine to Sit     Supine to sit: Min assist;+2 for safety/equipment;HOB elevated     General bed mobility comments: Cues to move legs to the EOB and then use them to anchor himself to help scoot himself around more  Transfers Overall transfer level: Needs assistance Equipment used: 2 person hand held assist Transfers: Sit to/from Stand Sit to Stand: Mod assist;+2 physical assistance;From elevated surface         General transfer comment: Verbal cues for technique.  Assist to power up to standing using chuck pad and cues for positioning. Required elevation of surface to complete transfer with sternal precautions.  Required assist to maintain upright position and balance.  Ambulation/Gait Ambulation/Gait assistance: Min assist;+2 safety/equipment Ambulation Distance (Feet): 40 Feet Assistive device:  (pushing w/c) Gait  Pattern/deviations: Step-through pattern;Decreased stride length;Shuffle;Trunk flexed Gait velocity: decreased Gait velocity interpretation: <1.8 ft/sec, indicative of risk for recurrent falls General Gait Details: very slow controlled gait, able to initiate step through giat pattern with limited overall stride. Patient with increased effort and noted fatigue during ambulation.   Stairs            Wheelchair Mobility    Modified Rankin (Stroke Patients Only)       Balance Overall balance assessment: Needs assistance Sitting-balance support: Feet supported;No upper extremity supported Sitting balance-Leahy Scale: Fair Sitting balance - Comments: tendency to posterior lean without trying to self correct without cues   Standing balance support: Bilateral upper extremity supported Standing balance-Leahy Scale: Poor                      Cognition Arousal/Alertness: Awake/alert Behavior During Therapy: WFL for tasks assessed/performed Overall Cognitive Status: Within Functional Limits for tasks assessed                      Exercises Other Exercises Other Exercises: Pt's first attempt with therapy to change from main power source to battery power. Pt had difficulty today unscrewing and reconneting lines. Pt able with verbal cues to get red arrows lined up to put batteries in clips. He knew how to check each battery for power. Pt currently total A for getting batteries in vest and control in holder due to weakness and multiple lines/tubes    General Comments        Pertinent Vitals/Pain Pain Assessment: 0-10 Pain Score: 6  Pain Location: right side where chest tube had be D/C'd Pain  Descriptors / Indicators: Sore Pain Intervention(s): Monitored during session (RN aware)    Home Living                      Prior Function            PT Goals (current goals can now be found in the care plan section) Acute Rehab PT Goals Patient Stated Goal:  none stated PT Goal Formulation: With patient Time For Goal Achievement: 12/29/14 Potential to Achieve Goals: Good Progress towards PT goals: Progressing toward goals    Frequency  Min 3X/week    PT Plan Current plan remains appropriate    Co-evaluation PT/OT/SLP Co-Evaluation/Treatment: Yes Reason for Co-Treatment: Complexity of the patient's impairments (multi-system involvement);For patient/therapist safety         End of Session Equipment Utilized During Treatment: Oxygen Activity Tolerance: Patient limited by fatigue;Patient limited by pain;Patient limited by lethargy Patient left: in chair;with call bell/phone within reach;with nursing/sitter in room     Time: 1015-1040 PT Time Calculation (min) (ACUTE ONLY): 25 min  Charges:  $Gait Training: 8-22 mins                    G CodesDuncan Dull 2015/01/11, 1:18 PM  Alben Deeds, [place] DPT  [phone]

## 2014-12-25 ENCOUNTER — Inpatient Hospital Stay (HOSPITAL_COMMUNITY): Payer: 59

## 2014-12-25 DIAGNOSIS — I5023 Acute on chronic systolic (congestive) heart failure: Secondary | ICD-10-CM

## 2014-12-25 DIAGNOSIS — Z95811 Presence of heart assist device: Secondary | ICD-10-CM

## 2014-12-25 LAB — COMPREHENSIVE METABOLIC PANEL
ALK PHOS: 66 U/L (ref 38–126)
ALT: 16 U/L — ABNORMAL LOW (ref 17–63)
AST: 26 U/L (ref 15–41)
Albumin: 2.5 g/dL — ABNORMAL LOW (ref 3.5–5.0)
Anion gap: 12 (ref 5–15)
BILIRUBIN TOTAL: 1.8 mg/dL — AB (ref 0.3–1.2)
BUN: 41 mg/dL — AB (ref 6–20)
CALCIUM: 8.9 mg/dL (ref 8.9–10.3)
CO2: 30 mmol/L (ref 22–32)
Chloride: 90 mmol/L — ABNORMAL LOW (ref 101–111)
Creatinine, Ser: 2.18 mg/dL — ABNORMAL HIGH (ref 0.61–1.24)
GFR calc Af Amer: 40 mL/min — ABNORMAL LOW (ref >60)
GFR, EST NON AFRICAN AMERICAN: 35 mL/min — AB (ref >60)
Glucose, Bld: 106 mg/dL — ABNORMAL HIGH (ref 65–99)
POTASSIUM: 3.7 mmol/L (ref 3.5–5.1)
Sodium: 132 mmol/L — ABNORMAL LOW (ref 135–145)
TOTAL PROTEIN: 6.3 g/dL — AB (ref 6.5–8.1)

## 2014-12-25 LAB — CBC WITH DIFFERENTIAL/PLATELET
BASOS PCT: 0 % (ref 0–1)
Basophils Absolute: 0 10*3/uL (ref 0.0–0.1)
EOS PCT: 0 % (ref 0–5)
Eosinophils Absolute: 0 10*3/uL (ref 0.0–0.7)
HCT: 23.7 % — ABNORMAL LOW (ref 39.0–52.0)
HEMOGLOBIN: 7.9 g/dL — AB (ref 13.0–17.0)
LYMPHS PCT: 10 % — AB (ref 12–46)
Lymphs Abs: 1 10*3/uL (ref 0.7–4.0)
MCH: 21 pg — AB (ref 26.0–34.0)
MCHC: 33.3 g/dL (ref 30.0–36.0)
MCV: 63 fL — AB (ref 78.0–100.0)
MONOS PCT: 12 % (ref 3–12)
Monocytes Absolute: 1.2 10*3/uL — ABNORMAL HIGH (ref 0.1–1.0)
NEUTROS PCT: 78 % — AB (ref 43–77)
Neutro Abs: 8 10*3/uL — ABNORMAL HIGH (ref 1.7–7.7)
Platelets: 274 10*3/uL (ref 150–400)
RBC: 3.76 MIL/uL — ABNORMAL LOW (ref 4.22–5.81)
RDW: 16.8 % — ABNORMAL HIGH (ref 11.5–15.5)
WBC: 10.2 10*3/uL (ref 4.0–10.5)

## 2014-12-25 LAB — GLUCOSE, CAPILLARY
GLUCOSE-CAPILLARY: 106 mg/dL — AB (ref 65–99)
GLUCOSE-CAPILLARY: 116 mg/dL — AB (ref 65–99)
GLUCOSE-CAPILLARY: 122 mg/dL — AB (ref 65–99)
Glucose-Capillary: 106 mg/dL — ABNORMAL HIGH (ref 65–99)

## 2014-12-25 LAB — PROTIME-INR
INR: 2.52 — AB (ref 0.00–1.49)
PROTHROMBIN TIME: 26.9 s — AB (ref 11.6–15.2)

## 2014-12-25 LAB — VANCOMYCIN, RANDOM: Vancomycin Rm: 20 ug/mL

## 2014-12-25 LAB — PHOSPHORUS: Phosphorus: 5.5 mg/dL — ABNORMAL HIGH (ref 2.5–4.6)

## 2014-12-25 LAB — CARBOXYHEMOGLOBIN
CARBOXYHEMOGLOBIN: 1.5 % (ref 0.5–1.5)
Methemoglobin: 0.9 % (ref 0.0–1.5)
O2 SAT: 78.6 %
Total hemoglobin: 7.8 g/dL — ABNORMAL LOW (ref 13.5–18.0)

## 2014-12-25 LAB — MAGNESIUM: MAGNESIUM: 2.1 mg/dL (ref 1.7–2.4)

## 2014-12-25 LAB — LACTATE DEHYDROGENASE: LDH: 264 U/L — ABNORMAL HIGH (ref 98–192)

## 2014-12-25 LAB — APTT: APTT: 60 s — AB (ref 24–37)

## 2014-12-25 MED ORDER — WARFARIN SODIUM 2.5 MG PO TABS
2.5000 mg | ORAL_TABLET | Freq: Every day | ORAL | Status: DC
  Administered 2014-12-25: 2.5 mg via ORAL
  Filled 2014-12-25 (×2): qty 1

## 2014-12-25 MED ORDER — FE FUMARATE-B12-VIT C-FA-IFC PO CAPS
1.0000 | ORAL_CAPSULE | Freq: Three times a day (TID) | ORAL | Status: DC
  Administered 2014-12-25 – 2014-12-26 (×5): 1 via ORAL
  Filled 2014-12-25 (×10): qty 1

## 2014-12-25 MED ORDER — ALLOPURINOL 100 MG PO TABS
100.0000 mg | ORAL_TABLET | Freq: Every day | ORAL | Status: DC
  Administered 2014-12-25 – 2015-01-03 (×10): 100 mg via ORAL
  Filled 2014-12-25 (×11): qty 1

## 2014-12-25 MED ORDER — DOCUSATE SODIUM 100 MG PO CAPS: 100.0000 mg | ORAL_CAPSULE | Freq: Two times a day (BID) | ORAL | Status: DC

## 2014-12-25 MED ORDER — METOCLOPRAMIDE HCL 5 MG/ML IJ SOLN
5.0000 mg | Freq: Four times a day (QID) | INTRAMUSCULAR | Status: AC
  Administered 2014-12-25: 5 mg via INTRAVENOUS

## 2014-12-25 MED ORDER — COLCHICINE 0.6 MG PO TABS
0.6000 mg | ORAL_TABLET | Freq: Two times a day (BID) | ORAL | Status: AC
  Administered 2014-12-25 – 2014-12-26 (×3): 0.6 mg via ORAL
  Filled 2014-12-25 (×4): qty 1

## 2014-12-25 MED ORDER — FUROSEMIDE 10 MG/ML IJ SOLN
80.0000 mg | Freq: Two times a day (BID) | INTRAMUSCULAR | Status: DC
  Administered 2014-12-25: 80 mg via INTRAVENOUS
  Filled 2014-12-25: qty 8

## 2014-12-25 MED ORDER — AMIODARONE HCL 200 MG PO TABS
200.0000 mg | ORAL_TABLET | Freq: Two times a day (BID) | ORAL | Status: DC
  Administered 2014-12-25 – 2014-12-26 (×4): 200 mg via ORAL
  Filled 2014-12-25 (×6): qty 1

## 2014-12-25 MED ORDER — VANCOMYCIN HCL IN DEXTROSE 750-5 MG/150ML-% IV SOLN
750.0000 mg | Freq: Once | INTRAVENOUS | Status: AC
Start: 2014-12-26 — End: 2014-12-26
  Administered 2014-12-26: 750 mg via INTRAVENOUS
  Filled 2014-12-25: qty 150

## 2014-12-25 MED ORDER — POTASSIUM CHLORIDE 10 MEQ/50ML IV SOLN
10.0000 meq | INTRAVENOUS | Status: AC
  Administered 2014-12-25 (×3): 10 meq via INTRAVENOUS
  Filled 2014-12-25 (×3): qty 50

## 2014-12-25 MED ORDER — METOPROLOL TARTRATE 12.5 MG HALF TABLET
12.5000 mg | ORAL_TABLET | Freq: Two times a day (BID) | ORAL | Status: DC
  Administered 2014-12-25: 12.5 mg via ORAL
  Filled 2014-12-25 (×2): qty 1

## 2014-12-25 NOTE — Progress Notes (Signed)
Admitted 12/14/14 per Dr. Haroldine Laws due to acute/chonic systolic HF with cardiogenic shock.  HeartMate II LVAD implated on 12/20/14 by Dr. Cyndia Bent as destination therapy VAD.    Vital signs: Temp:  97.6  HR: 110 - 120 AFib Doppler MAP: 80 O2 Sat: 97 - 100% 3L [place] Wt in lbs:  169 > 187> 181> 179> 175   Intra-op blood products: 4FFP 1 plt 1100 cell saver  Gtts: Milrinone 0.125  Nitric Oxide: Off 12/23/14  Anticoagulants: ASA 325mg  started 12/21/14 Coumadin started 12/22/14  INR Goal 2 - 3  LVAD interrogation reveals:  Speed:  9200 Flow:  4.6 Power: 5.2 PI: 7.3 Alarms:  None Events:  None since 12/23/14 Fixed speed:  9200 Low speed limit:  8600  Drive Line:   Gauze dressing intact with attachment device accurately applied; being changed daily.   Arrhythmia:  12/20/14: AFlutter/AFib with RVR requiring multiple boluses and continuous infusion Amiodarone.   Labs:   Co-ox:  54 > 59> 52> 67.3> 67.9> 67.6> 78.6  Hgb:  11.2 > 8.3> 8.7> 7.9 (Iron PO started)   WBC:  8.5 > 9.5> 14.5> 19.1> 16.6> 14.3> 10.2  LDH: 279> 220> 305 (peak)> 286> 254> 264   INR: 1.54> 1.53> 1.38> 1.47> 2.52  Creatinine: 1.20 - 1.4 (baseline pre-op)> 1.29> 1.58> 1.80> 1.91. 2.0> 2.03> 2.0> 2.18  Plan/Recommendation as D/W Team:  1. Dressing changes are being maintained by bedside nurses at this point. Two sisters Caren Griffins and Jeannene Patella are only available on weekends for now (they both work in [employer] [employer] [facility] [employer]). Will need to have bedside nursing support regarding teaching family sterile technique and DL dressing maintenance.   2. HR better on digoxin and PO Amiodarone. Flows on VAD somewhat better but still < 5.0 with increased speed to 9200.   3. PO intake still marginal. Reports that he vomited yesterday but did have a BM. Hoping to tolerate lunch today.   4. Pain under better control. Ambulating with staff/cardiac rehab more and more each day.   5. Creatinine bumped to 2.18 from baseline (1.4). Good UOP and Co-ox good on  current dose Milrinone. Continue to monitor.

## 2014-12-25 NOTE — Progress Notes (Signed)
ANTIBIOTIC CONSULT NOTE - FOLLOW UP  Pharmacy Consult for vancomycin Indication: pneumonia  No Known Allergies  Patient Measurements: Height: 5\' 7"  (170.2 cm) Weight: 175 lb 14.8 oz (79.8 kg) IBW/kg (Calculated) : 66.1   Vital Signs: Temp: 97.8 F (36.6 C) (09/06 1929) Temp Source: Oral (09/06 1929) Pulse Rate: 51 (09/06 2100) Intake/Output from previous day: 09/05 0701 - 09/06 0700 In: 2151.1 [P.O.:250; I.V.:1301.1; IV Piggyback:600] Out: 2826 [Urine:2655; Emesis/NG output:100; Stool:1; Chest Tube:70] Intake/Output from this shift: Total I/O In: 65.8 [I.V.:65.8] Out: -   Labs:  Recent Labs  12/23/14 0351  12/24/14 0334 12/24/14 1608 12/25/14 0500  WBC 16.6*  --  14.3*  --  10.2  HGB 8.8*  < > 8.7* 12.2* 7.9*  PLT 242  --  264  --  274  CREATININE 1.87*  < > 2.03* 2.00* 2.18*  < > = values in this interval not displayed. Estimated Creatinine Clearance: 42.9 mL/min (by C-G formula based on Cr of 2.18).  Recent Labs  12/24/14 2042 12/25/14 2110  VANCOTROUGH 47*  --   VANCORANDOM  --  20     Microbiology: Recent Results (from the past 720 hour(s))  Urine culture     Status: None   Collection Time: 11/30/14  3:32 PM  Result Value Ref Range Status   Specimen Description URINE, CLEAN CATCH  Final   Special Requests NONE  Final   Culture NO GROWTH 1 DAY  Final   Report Status 12/01/2014 FINAL  Final  MRSA PCR Screening     Status: None   Collection Time: 11/30/14  6:46 PM  Result Value Ref Range Status   MRSA by PCR NEGATIVE NEGATIVE Final    Comment:        The GeneXpert MRSA Assay (FDA approved for NASAL specimens only), is one component of a comprehensive MRSA colonization surveillance program. It is not intended to diagnose MRSA infection nor to guide or monitor treatment for MRSA infections.   MRSA PCR Screening     Status: None   Collection Time: 12/14/14  3:20 PM  Result Value Ref Range Status   MRSA by PCR NEGATIVE NEGATIVE Final   Comment:        The GeneXpert MRSA Assay (FDA approved for NASAL specimens only), is one component of a comprehensive MRSA colonization surveillance program. It is not intended to diagnose MRSA infection nor to guide or monitor treatment for MRSA infections.   Surgical pcr screen     Status: Abnormal   Collection Time: 12/15/14  4:34 PM  Result Value Ref Range Status   MRSA, PCR NEGATIVE NEGATIVE Final   Staphylococcus aureus POSITIVE (A) NEGATIVE Final    Comment:        The Xpert SA Assay (FDA approved for NASAL specimens in patients over 54 years of age), is one component of a comprehensive surveillance program.  Test performance has been validated by [facility] for patients greater than or equal to 69 year old. It is not intended to diagnose infection nor to guide or monitor treatment.   Surgical pcr screen     Status: Abnormal   Collection Time: 12/17/14 11:26 AM  Result Value Ref Range Status   MRSA, PCR NEGATIVE NEGATIVE Final   Staphylococcus aureus POSITIVE (A) NEGATIVE Final    Comment:        The Xpert SA Assay (FDA approved for NASAL specimens in patients over 11 years of age), is one component of a comprehensive surveillance program.  Test performance has been validated by [facility] for patients greater than or equal to 48 year old. It is not intended to diagnose infection nor to guide or monitor treatment.       Assessment: 44 yo male with HCAP is currently on vancomycin and zosyn.   Vancomycin trough was 39 on 9/5 at .~ 2100 and dose was held.  A vancomycin random drawn tonight at ~ 2100 is 20 mcg/ml.    Goal of Therapy:  Vancomycin trough level 15-20 mcg/ml  Plan:  -redose with vancomycin 750 mg tomorrow morning 12/26/14 at 10am and further dosing per HF pharmacist  Eudelia Bunch, Pharm.D. [id number] 12/25/2014 10:04 PM

## 2014-12-25 NOTE — Progress Notes (Signed)
Physical Therapy Treatment Patient Details Name: [name] MRN: [id number] DOB: Mar 25, 1971 Today's Date: 12/25/2014    History of Present Illness Patient is a 44 yo male admitted 12/14/14 with heart failure.  Patient s/p LVAD on 12/20/14.  PMH:  NICM, EF 20-25%, CHF, ICD, Afib, HTN, severe MR/TR, RA    PT Comments    Patient progressing well with mobility. Ambulated increased distance, main limiting factor was pain in right foot (pt with history of gout, MD notified). Patient with good recall of LVAD precautions and demonstrated teachback for power conversion with minimal cues. Will continue to see and progress as tolerated.   Follow Up Recommendations  Home health PT;Supervision/Assistance - 24 hour     Equipment Recommendations  Rolling walker with 5" wheels    Recommendations for Other Services       Precautions / Restrictions Precautions Precautions: Sternal;Fall Precaution Comments: LVAD Restrictions Weight Bearing Restrictions: Yes Other Position/Activity Restrictions: Sternal precautions    Mobility  Bed Mobility Overal bed mobility: Needs Assistance Bed Mobility: Supine to Sit     Supine to sit: Min assist;+2 for safety/equipment;HOB elevated     General bed mobility comments: No cues needed today to move legs to EOB and use them to anchor himself to help himself scoot around to EOB. Min A to scoot to EOB  Transfers Overall transfer level: Needs assistance Equipment used: 2 person hand held assist Transfers: Sit to/from Stand Sit to Stand: Min assist;+2 physical assistance (from regular height bed surface)         General transfer comment: Verbal cues for technique.    Ambulation/Gait Ambulation/Gait assistance: Min assist;+2 safety/equipment Ambulation Distance (Feet): 160 Feet (with seated rest break after 100 ft) Assistive device:  (pushing w/c) Gait Pattern/deviations: Step-through pattern;Decreased stride length;Shuffle;Trunk flexed,  Antalgic Gait velocity: decreased Gait velocity interpretation: <1.8 ft/sec, indicative of risk for recurrent falls General Gait Details: very slow controlled gait, able to initiate step through giat pattern with limited overall stride. Patient with increased effort and noted fatigue during ambulation.   Stairs            Wheelchair Mobility    Modified Rankin (Stroke Patients Only)       Balance Overall balance assessment: Needs assistance Sitting-balance support: Feet supported;No upper extremity supported Sitting balance-Leahy Scale: Fair Sitting balance - Comments: no posterior leaning today while at EOB   Standing balance support: Bilateral upper extremity supported Standing balance-Leahy Scale: Fair                      Cognition Arousal/Alertness: Awake/alert Behavior During Therapy: WFL for tasks assessed/performed Overall Cognitive Status: Within Functional Limits for tasks assessed                      Exercises Other Exercises Other Exercises: Pt changed from main power source to battery power with min guard A/cuing. Pt with less difficulty today unscrewing and reconneting lines. Pt able to get red arrows lined up to put batteries in clips. Pt currently total A for getting batteries in vest and control in holder due to weakness and multiple lines/tubes. Pt able to state what 3 items he needed to have in his bag with him at all times.    General Comments        Pertinent Vitals/Pain Pain Assessment: Pain in foot reported and limiting overall mobility today, no value given    Home Living  Prior Function            PT Goals (current goals can now be found in the care plan section) Acute Rehab PT Goals PT Goal Formulation: With patient Time For Goal Achievement: 12/29/14 Potential to Achieve Goals: Good Progress towards PT goals: Progressing toward goals    Frequency  Min 3X/week    PT Plan Current  plan remains appropriate    Co-evaluation PT/OT/SLP Co-Evaluation/Treatment: Yes Reason for Co-Treatment: Complexity of the patient's impairments (multi-system involvement);For patient/therapist safety PT goals addressed during session: Mobility/safety with mobility OT goals addressed during session: Strengthening/ROM     End of Session Equipment Utilized During Treatment: Oxygen 2 liters Activity Tolerance: Patient limited by fatigue;Patient limited by pain;Patient limited by lethargy Patient left: in chair;with call bell/phone within reach;with nursing/sitter in room     Time: 1030-1055 PT Time Calculation (min) (ACUTE ONLY): 25 min  Charges:  $Gait Training: 8-22 mins                    G CodesDuncan Dull 2015-01-11, 1:27 PM Alben Deeds, [place] DPT  [phone]

## 2014-12-25 NOTE — Progress Notes (Addendum)
MD Bartle aware of lab results this morning, see chart, will continue to monitor

## 2014-12-25 NOTE — Progress Notes (Signed)
Occupational Therapy Treatment Patient Details Name: [name] MRN: [id number] DOB: Aug 20, 1970 Today's Date: 12/25/2014    History of present illness Patient is a 44 yo male admitted 12/14/14 with heart failure.  Patient s/p LVAD on 12/20/14.  PMH:  NICM, EF 20-25%, CHF, ICD, Afib, HTN, severe MR/TR, RA   OT comments  This 44 yo male admitted with above presents to acute OT making progress with mobility and with changing power sources  Follow Up Recommendations  Home health OT    Equipment Recommendations  3 in 1 bedside comode (rollator)       Precautions / Restrictions Precautions Precaution Comments: LVAD Restrictions Weight Bearing Restrictions: Yes Other Position/Activity Restrictions: Sternal precautions       Mobility Bed Mobility Overal bed mobility: Needs Assistance Bed Mobility: Supine to Sit     Supine to sit: Min assist;+2 for safety/equipment;HOB elevated     General bed mobility comments: No cues needed today to move legs to EOB and use them to anchor himself to help himself scoot around to EOB. Min A to scoot to EOB  Transfers Overall transfer level: Needs assistance Equipment used: 2 person hand held assist Transfers: Sit to/from Stand Sit to Stand: Min assist;+2 physical assistance (from regular height bed surface)         General transfer comment: Verbal cues for technique.      Balance Overall balance assessment: Needs assistance Sitting-balance support: Feet supported;No upper extremity supported Sitting balance-Leahy Scale: Fair Sitting balance - Comments: no posterior leaning today while at EOB   Standing balance support: Bilateral upper extremity supported Standing balance-Leahy Scale: Fair                           Vision                 Additional Comments: No change from baseline          Cognition   Behavior During Therapy: WFL for tasks assessed/performed Overall Cognitive Status: Within Functional  Limits for tasks assessed                         Exercises Other Exercises Other Exercises: Pt changed from main power source to battery power with min guard A/cuing. Pt with less difficulty today unscrewing and reconneting lines. Pt able to get red arrows lined up to put batteries in clips. Pt currently total A for getting batteries in vest and control in holder due to weakness and multiple lines/tubes. Pt able to state what 3 items he needed to have in his bag with him at all times.           Pertinent Vitals/ Pain       Pain Assessment: No/denies pain         Frequency Min 3X/week     Progress Toward Goals  OT Goals(current goals can now be found in the care plan section)  Progress towards OT goals: Progressing toward goals     Plan Discharge plan remains appropriate    Co-evaluation      Reason for Co-Treatment: Complexity of the patient's impairments (multi-system involvement);For patient/therapist safety   OT goals addressed during session: Strengthening/ROM         Activity Tolerance Patient tolerated treatment well   Patient Left in chair;with call bell/phone within reach   Nurse Communication  (RN in room during prep for ambulating and post ambulation to get  him set back up with all his lines/tubes)        Time: 1030-1055 OT Time Calculation (min): 25 min  Charges: OT General Charges $OT Visit: 1 Procedure OT Treatments $Therapeutic Activity: 8-22 mins  [name] [id number] 12/25/2014, 12:53 PM

## 2014-12-25 NOTE — Progress Notes (Signed)
Patient ID: BARTH REPP, male   DOB: [birth year], 44 y.o.   MRN: [id number] HeartMate 2 Rounding Note  Subjective:    Still has some nausea. Had BM yesterday. Not eating yet, only a few sips. Taking a fair amount of pain medicine.  On Milrinone 0.25, Co-ox 78.6 CVP 8  Wt  Down 3 lbs from yesteray.  Still 7 lbs over preop weight.  I=O yesterday  LVAD INTERROGATION:  HeartMate II LVAD:  Flow 4.4 liters/min, speed 9000, power 5, PI 8.    Objective:    Vital Signs:   Temp:  [97.4 F (36.3 C)-97.8 F (36.6 C)] 97.7 F (36.5 C) (09/06 0423) Pulse Rate:  [30-130] 113 (09/06 0745) Resp:  [10-26] 17 (09/06 0745) SpO2:  [99 %-100 %] 99 % (09/06 0745) Weight:  [79.8 kg (175 lb 14.8 oz)] 79.8 kg (175 lb 14.8 oz) (09/06 0500) Last BM Date: 12/18/14 Mean arterial Pressure 80's mostly, 90 this am  Intake/Output:   Intake/Output Summary (Last 24 hours) at 12/25/14 0757 Last data filed at 12/25/14 0700  Gross per 24 hour  Intake 2101.1 ml  Output   2826 ml  Net -724.9 ml     Physical Exam: General:  Looks weak, No resp difficulty HEENT: normal Neck: supple. JVP . Carotids 2+ bilat; no bruits. No lymphadenopathy or thryomegaly appreciated. Cor: distant heart sounds with LVAD hum present. Lungs: clear Abdomen: soft, nontender, nondistended. No hepatosplenomegaly. No bruits or masses. Good bowel sounds. Extremities: no cyanosis, clubbing, rash, mild edema Neuro: alert & orientedx3, cranial nerves grossly intact. moves all 4 extremities w/o difficulty. Affect pleasant  Telemetry: atrial fib 90's to low 100's  Labs: Basic Metabolic Panel:  Recent Labs Lab 12/21/14 0308  12/21/14 1603 12/22/14 0331  12/22/14 2153 12/23/14 0351 12/23/14 1457 12/24/14 0334 12/24/14 1608 12/25/14 0500  NA 133*  < >  --  132*  < > 132* 130* 130* 130* 130* 132*  K 4.4  < >  --  5.0  < > 4.2 3.9 3.8 3.3* 3.6 3.7  CL 104  < >  --  99*  < > 97* 95* 91* 90* 89* 90*  CO2 21*  --   --  22  --  23  25  --  29  --  30  GLUCOSE 107*  < >  --  124*  < > 142* 117* 118* 120* 119* 106*  BUN 15  < >  --  19  < > 26* 28* 33* 34* 38* 41*  CREATININE 1.18  < > 1.29* 1.58*  < > 1.91* 1.87* 2.00* 2.03* 2.00* 2.18*  CALCIUM 8.0*  --   --  8.8*  --  9.1 9.0  --  9.0  --  8.9  MG 2.0  --  2.0 2.1  --   --  2.0  --  2.1  --  2.1  PHOS 4.5  --   --  5.4*  --   --  5.5*  --  5.6*  --  5.5*  < > = values in this interval not displayed.  Liver Function Tests:  Recent Labs Lab 12/21/14 0308 12/22/14 0331 12/23/14 0351 12/24/14 0334 12/25/14 0500  AST 52* 61* 42* 31 26  ALT 18 20 20 17  16*  ALKPHOS 43 63 65 62 66  BILITOT 3.1* 3.4* 2.6* 2.3* 1.8*  PROT 5.1* 6.2* 6.7 6.4* 6.3*  ALBUMIN 3.3* 3.2* 2.7* 2.6* 2.5*   No results for input(s): LIPASE, AMYLASE in  the last 168 hours. No results for input(s): AMMONIA in the last 168 hours.  CBC:  Recent Labs Lab 12/21/14 0308  12/21/14 1603 12/22/14 0331  12/23/14 0351 12/23/14 1457 12/24/14 0334 12/24/14 1608 12/25/14 0500  WBC 8.5  < > 14.5* 19.1*  --  16.6*  --  14.3*  --  10.2  NEUTROABS 6.4  --   --  14.7*  --  13.5*  --  11.6*  --  8.0*  HGB REPEATED TO VERIFY  < > 9.4* 9.5*  < > 8.8* 11.6* 8.7* 12.2* 7.9*  HCT 24.8*  < > 28.8* 28.6*  < > 27.2* 34.0* 26.1* 36.0* 23.7*  MCV 65.8*  < > 65.9* 64.7*  --  64.3*  --  62.9*  --  63.0*  PLT 117*  < > 145* 168  --  242  --  264  --  274  < > = values in this interval not displayed.  INR:  Recent Labs Lab 12/21/14 0308 12/22/14 0331 12/23/14 0351 12/24/14 0334 12/25/14 0500  INR 1.54* 1.53* 1.38 1.47 2.52*   LDH: 264  Other results:  EKG:   Imaging: Dg Chest Port 1 View  12/25/2014   CLINICAL DATA:  Left ventricular assist device.  EXAM: PORTABLE CHEST - 1 VIEW  COMPARISON:  12/24/2014 .  FINDINGS: Right IJ line, right PICC line in stable position. AICD in left ventricular assist device in stable position. Prior median sternotomy. Left atrial appendage clip noted. Stable  cardiomegaly. No pulmonary venous congestion. Low lung volumes with mild bibasilar and left mid lung field subsegmental atelectasis.  IMPRESSION: 1. Lines and tubes in stable position. 2. Prior median sternotomy. Left atrial appendage clip noted. AICD and left ventricular assist device in stable position. Stable cardiomegaly. No pulmonary venous congestion. 3. Low lung volumes with mild bibasilar and left mid lung field subsegmental atelectasis.   Electronically Signed   By: Marcello Moores  Register   On: 12/25/2014 07:30   Dg Chest Port 1 View  12/24/2014   CLINICAL DATA:  Cardiogenic shock, acute on chronic heart failure and renal failure. Left ventricular assist device.  EXAM: PORTABLE CHEST - 1 VIEW  COMPARISON:  12/23/2014  FINDINGS: Cardiomegaly, mild point vascular congestion, left ventricular assist device, left AICD, right IJ Cordis, right IJ central venous catheter with tip overlying the superior cavoatrial junction and right PICC line with tip overlying the superior vena cava junction, and 2 left thoracostomy tubes again noted.  There is no evidence of pneumothorax.  There has been little interval change since the prior study with mild left basilar atelectasis.  IMPRESSION: Little interval change since the prior study with continued mild pulmonary vascular congestion and mild left basilar atelectasis.   Electronically Signed   By: Margarette Canada M.D.   On: 12/24/2014 08:19   Dg Chest Port 1 View  12/23/2014   CLINICAL DATA:  44 year old male with history of left ventricular assist assist device. Status post PICC placement. Evaluate position of PICC.  EXAM: PORTABLE CHEST - 1 VIEW  COMPARISON:  Multiple priors, most recently 12/23/2014.  FINDINGS: Interval placement of a right upper extremity PICC with tip terminating in the right atrium approximately 3 cm distal to the superior cavoatrial junction. There is a right-sided internal jugular central venous catheter with tip terminating in the superior cavoatrial  junction. Additional right IJ Cordis with tip terminating in the proximal superior vena cava. Left-sided pacemaker/AICD with lead tip projecting over the expected location of the right ventricular  apex. Inflow cannula for left ventricular assist device appears cranially angulated, but similar to prior studies. Left-sided chest tube in position with tip and side port projecting over the left hemithorax. Lung volumes are low. No consolidative airspace disease. Minimal bibasilar (left greater than right), subsegmental atelectasis. Trace left pleural effusion. No pneumothorax. Congestion of the pulmonary vasculature, without frank pulmonary edema. Mild enlargement of the cardiopericardial silhouette is similar to the prior study. The patient is rotated to the left on today's exam, resulting in distortion of the mediastinal contours and reduced diagnostic sensitivity and specificity for mediastinal pathology.  IMPRESSION: 1. Postoperative changes and support apparatus, as above. Tip of new right PICC is in the right atrium approximately 3 cm distal to the superior cavoatrial junction. 2. Mild cardiomegaly with pulmonary venous congestion, but no frank pulmonary edema.   Electronically Signed   By: Vinnie Langton M.D.   On: 12/23/2014 13:32   Dg Abd Portable 1v  12/24/2014   CLINICAL DATA:  Vomiting and abdominal distention. Status post LVAD.  EXAM: PORTABLE ABDOMEN - 1 VIEW  COMPARISON:  11/09/2014, 12/03/2014  FINDINGS: Patient has had median sternotomy. A right ventricular lead identified. Patient has had placement of a left ventricular assist device. Upper portion overlies the cardiac apex. Lower portion overlies T12. Bowel gas pattern is nonobstructive.  IMPRESSION: Interval placement of left ventricular assist device.  Nonobstructed bowel gas pattern.   Electronically Signed   By: Nolon Nations M.D.   On: 12/24/2014 20:06      Medications:     Scheduled Medications: . acetaminophen  1,000 mg Oral 4  times per day   Or  . acetaminophen (TYLENOL) oral liquid 160 mg/5 mL  1,000 mg Per Tube 4 times per day  . amiodarone  200 mg Oral BID  . antiseptic oral rinse  7 mL Mouth Rinse QID  . aspirin EC  325 mg Oral Daily   Or  . aspirin  324 mg Per Tube Daily   Or  . aspirin  300 mg Rectal Daily  . bisacodyl  10 mg Oral Daily   Or  . bisacodyl  10 mg Rectal Daily  . citalopram  20 mg Oral Daily  . docusate sodium  200 mg Oral Daily  . furosemide  80 mg Intravenous BID  . metoCLOPramide (REGLAN) injection  5 mg Intravenous 4 times per day  . pantoprazole  40 mg Oral Daily  . piperacillin-tazobactam (ZOSYN)  IV  3.375 g Intravenous Q8H  . potassium chloride  10 mEq Intravenous Q1 Hr x 3  . sodium chloride  10-40 mL Intracatheter Q12H  . sodium chloride  3 mL Intravenous Q12H  . sorbitol  30 mL Oral q morning - 10a  . sorbitol  30 mL Oral Once  . warfarin  2.5 mg Oral q1800  . Warfarin - Physician Dosing Inpatient   Does not apply q1800     Infusions: . sodium chloride Stopped (12/22/14 0600)  . sodium chloride 20 mL/hr at 12/25/14 0700  . sodium chloride 250 mL (12/25/14 0700)  . lactated ringers Stopped (12/20/14 2000)  . lactated ringers Stopped (12/22/14 0600)  . milrinone 0.125 mcg/kg/min (12/25/14 0748)     PRN Medications:  sodium chloride, hydrALAZINE, morphine injection, ondansetron (ZOFRAN) IV, oxyCODONE, sodium chloride, sodium chloride, traMADol   Assessment:   1. Acute/chonic systolic HF , NICM EF 0000000 with Cardiogenic shock on milrinone and levophed, IABP preoop, s/p HMII implant 12/20/2014 2. Chronic atrial fib 3. Postop atrial  flutter with RVR 4. Expected EBL anemia 5. Fever early postop, resolved 6. Acute post op non-oliguric renal failure  Plan/Discussion:    Hemodynamically he looks very good. Will decrease milrinone to 0.125.   Switch lasix to IV intermittent. Creat relatively stable with good urine output and CVP low.  DC pericardial drain and  central line  Therapeutic on coumadin. Decrease dose to 2.5 and stop heparin.  Follow anemia and start Fe2+  Nausea has been a persistent problem for him. Had BM and no ileus. Amiodarone could be playing a part. Will decrease dose to 200 bid.   Continue PT, IS.  I reviewed the LVAD parameters from today, and compared the results to the patient's prior recorded data.  No programming changes were made.  The LVAD is functioning within specified parameters.  The patient performs LVAD self-test daily.  LVAD interrogation was negative for any significant power changes, alarms or PI events/speed drops.  LVAD equipment check completed and is in good working order.  Back-up equipment present.   LVAD education done on emergency procedures and precautions and reviewed exit site care.  Length of Stay: [address]  Fernande Boyden [facility] 12/25/2014, 7:57 AM

## 2014-12-25 NOTE — Progress Notes (Signed)
VAD Team Rounding Note  Subjective:    s/p HMII implant 9/1  Remains on  milrinone 0.125 mcg.  VAD at 2. Had BM 9/5   Hgb 7.8 today   Denies SOB.    Co-ox 78% Cr 1.5-> 1.9 -> 1.8-> 2.0->2.1   LVAD INTERROGATION:  HeartMate II LVAD:  Flow 4.5 liters/min, speed 9000, power 4.8,  PI 8.1  few PI events   Objective:    Vital Signs:   Temp:  [97.4 F (36.3 C)-97.7 F (36.5 C)] 97.6 F (36.4 C) (09/06 0745) Pulse Rate:  [30-130] 31 (09/06 0800) Resp:  [10-26] 18 (09/06 0800) SpO2:  [99 %-100 %] 99 % (09/06 0800) Weight:  [175 lb 14.8 oz (79.8 kg)] 175 lb 14.8 oz (79.8 kg) (09/06 0500) Last BM Date: 12/24/14 Mean arterial Pressure 80  Intake/Output:   Intake/Output Summary (Last 24 hours) at 12/25/14 0900 Last data filed at 12/25/14 0809  Gross per 24 hour  Intake 1964.24 ml  Output   2841 ml  Net -876.76 ml     Physical Exam: CVP ~6-7  General:lying in bed NAD HEENT: normal  Neck: supple.  Cor Sternal dressing in place.  Mechanical heart sounds with LVAD hum present. Lungs: Decreased in the bases.  Abdomen: soft, nontender, nondistended. hypoactive bowel sounds. Driveline: C/D/I; securement device intact and driveline incorporated Extremities: no cyanosis, clubbing, rash, R and LLE SCDs.  Neuro: awake conversant GU: Foley   Telemetry: AF 110-120s  Labs: Basic Metabolic Panel:  Recent Labs Lab 12/21/14 0308  12/21/14 1603 12/22/14 0331  12/22/14 2153 12/23/14 0351 12/23/14 1457 12/24/14 0334 12/24/14 1608 12/25/14 0500  NA 133*  < >  --  132*  < > 132* 130* 130* 130* 130* 132*  K 4.4  < >  --  5.0  < > 4.2 3.9 3.8 3.3* 3.6 3.7  CL 104  < >  --  99*  < > 97* 95* 91* 90* 89* 90*  CO2 21*  --   --  22  --  23 25  --  29  --  30  GLUCOSE 107*  < >  --  124*  < > 142* 117* 118* 120* 119* 106*  BUN 15  < >  --  19  < > 26* 28* 33* 34* 38* 41*  CREATININE 1.18  < > 1.29* 1.58*  < > 1.91* 1.87* 2.00* 2.03* 2.00* 2.18*  CALCIUM 8.0*  --   --   8.8*  --  9.1 9.0  --  9.0  --  8.9  MG 2.0  --  2.0 2.1  --   --  2.0  --  2.1  --  2.1  PHOS 4.5  --   --  5.4*  --   --  5.5*  --  5.6*  --  5.5*  < > = values in this interval not displayed.  Liver Function Tests:  Recent Labs Lab 12/21/14 0308 12/22/14 0331 12/23/14 0351 12/24/14 0334 12/25/14 0500  AST 52* 61* 42* 31 26  ALT 18 20 20 17  16*  ALKPHOS 43 63 65 62 66  BILITOT 3.1* 3.4* 2.6* 2.3* 1.8*  PROT 5.1* 6.2* 6.7 6.4* 6.3*  ALBUMIN 3.3* 3.2* 2.7* 2.6* 2.5*   No results for input(s): LIPASE, AMYLASE in the last 168 hours. No results for input(s): AMMONIA in the last 168 hours.  CBC:  Recent Labs Lab 12/21/14 0308  12/21/14 1603  12/22/14 0331  12/23/14 0351 12/23/14 1457 12/24/14 0334 12/24/14 1608 12/25/14 0500  WBC 8.5  < > 14.5* 19.1*  --  16.6*  --  14.3*  --  10.2  NEUTROABS 6.4  --   --  14.7*  --  13.5*  --  11.6*  --  8.0*  HGB REPEATED TO VERIFY  < > 9.4* 9.5*  < > 8.8* 11.6* 8.7* 12.2* 7.9*  HCT 24.8*  < > 28.8* 28.6*  < > 27.2* 34.0* 26.1* 36.0* 23.7*  MCV 65.8*  < > 65.9* 64.7*  --  64.3*  --  62.9*  --  63.0*  PLT 117*  < > 145* 168  --  242  --  264  --  274  < > = values in this interval not displayed.  INR:  Recent Labs Lab 12/21/14 0308 12/22/14 0331 12/23/14 0351 12/24/14 0334 12/25/14 0500  INR 1.54* 1.53* 1.38 1.47 2.52*    Other results:   Imaging: Dg Chest Port 1 View  12/25/2014   CLINICAL DATA:  Left ventricular assist device.  EXAM: PORTABLE CHEST - 1 VIEW  COMPARISON:  12/24/2014 .  FINDINGS: Right IJ line, right PICC line in stable position. AICD in left ventricular assist device in stable position. Prior median sternotomy. Left atrial appendage clip noted. Stable cardiomegaly. No pulmonary venous congestion. Low lung volumes with mild bibasilar and left mid lung field subsegmental atelectasis.  IMPRESSION: 1. Lines and tubes in stable position. 2. Prior median sternotomy. Left atrial appendage clip noted. AICD and left  ventricular assist device in stable position. Stable cardiomegaly. No pulmonary venous congestion. 3. Low lung volumes with mild bibasilar and left mid lung field subsegmental atelectasis.   Electronically Signed   By: Marcello Moores  Register   On: 12/25/2014 07:30   Dg Chest Port 1 View  12/24/2014   CLINICAL DATA:  Cardiogenic shock, acute on chronic heart failure and renal failure. Left ventricular assist device.  EXAM: PORTABLE CHEST - 1 VIEW  COMPARISON:  12/23/2014  FINDINGS: Cardiomegaly, mild point vascular congestion, left ventricular assist device, left AICD, right IJ Cordis, right IJ central venous catheter with tip overlying the superior cavoatrial junction and right PICC line with tip overlying the superior vena cava junction, and 2 left thoracostomy tubes again noted.  There is no evidence of pneumothorax.  There has been little interval change since the prior study with mild left basilar atelectasis.  IMPRESSION: Little interval change since the prior study with continued mild pulmonary vascular congestion and mild left basilar atelectasis.   Electronically Signed   By: Margarette Canada M.D.   On: 12/24/2014 08:19   Dg Chest Port 1 View  12/23/2014   CLINICAL DATA:  44 year old male with history of left ventricular assist assist device. Status post PICC placement. Evaluate position of PICC.  EXAM: PORTABLE CHEST - 1 VIEW  COMPARISON:  Multiple priors, most recently 12/23/2014.  FINDINGS: Interval placement of a right upper extremity PICC with tip terminating in the right atrium approximately 3 cm distal to the superior cavoatrial junction. There is a right-sided internal jugular central venous catheter with tip terminating in the superior cavoatrial junction. Additional right IJ Cordis with tip terminating in the proximal superior vena cava. Left-sided pacemaker/AICD with lead tip projecting over the expected location of the right ventricular apex. Inflow cannula for left ventricular assist device appears  cranially angulated, but similar to prior studies. Left-sided chest tube in position with tip and side port projecting over the  left hemithorax. Lung volumes are low. No consolidative airspace disease. Minimal bibasilar (left greater than right), subsegmental atelectasis. Trace left pleural effusion. No pneumothorax. Congestion of the pulmonary vasculature, without frank pulmonary edema. Mild enlargement of the cardiopericardial silhouette is similar to the prior study. The patient is rotated to the left on today's exam, resulting in distortion of the mediastinal contours and reduced diagnostic sensitivity and specificity for mediastinal pathology.  IMPRESSION: 1. Postoperative changes and support apparatus, as above. Tip of new right PICC is in the right atrium approximately 3 cm distal to the superior cavoatrial junction. 2. Mild cardiomegaly with pulmonary venous congestion, but no frank pulmonary edema.   Electronically Signed   By: Vinnie Langton M.D.   On: 12/23/2014 13:32   Dg Abd Portable 1v  12/24/2014   CLINICAL DATA:  Vomiting and abdominal distention. Status post LVAD.  EXAM: PORTABLE ABDOMEN - 1 VIEW  COMPARISON:  11/09/2014, 12/03/2014  FINDINGS: Patient has had median sternotomy. A right ventricular lead identified. Patient has had placement of a left ventricular assist device. Upper portion overlies the cardiac apex. Lower portion overlies T12. Bowel gas pattern is nonobstructive.  IMPRESSION: Interval placement of left ventricular assist device.  Nonobstructed bowel gas pattern.   Electronically Signed   By: Nolon Nations M.D.   On: 12/24/2014 20:06     Medications:     Scheduled Medications: . acetaminophen  1,000 mg Oral 4 times per day   Or  . acetaminophen (TYLENOL) oral liquid 160 mg/5 mL  1,000 mg Per Tube 4 times per day  . amiodarone  200 mg Oral BID  . antiseptic oral rinse  7 mL Mouth Rinse QID  . aspirin EC  325 mg Oral Daily   Or  . aspirin  324 mg Per Tube Daily    Or  . aspirin  300 mg Rectal Daily  . bisacodyl  10 mg Oral Daily   Or  . bisacodyl  10 mg Rectal Daily  . citalopram  20 mg Oral Daily  . docusate sodium  200 mg Oral Daily  . ferrous Q000111Q C-folic acid  1 capsule Oral TID PC  . furosemide  80 mg Intravenous BID  . metoCLOPramide (REGLAN) injection  5 mg Intravenous 4 times per day  . pantoprazole  40 mg Oral Daily  . piperacillin-tazobactam (ZOSYN)  IV  3.375 g Intravenous Q8H  . potassium chloride  10 mEq Intravenous Q1 Hr x 3  . sodium chloride  10-40 mL Intracatheter Q12H  . sodium chloride  3 mL Intravenous Q12H  . sorbitol  30 mL Oral q morning - 10a  . sorbitol  30 mL Oral Once  . warfarin  2.5 mg Oral q1800  . Warfarin - Physician Dosing Inpatient   Does not apply q1800    Infusions: . sodium chloride Stopped (12/22/14 0600)  . sodium chloride 20 mL/hr at 12/25/14 0800  . sodium chloride 250 mL (12/25/14 0800)  . lactated ringers Stopped (12/20/14 2000)  . lactated ringers Stopped (12/22/14 0600)  . milrinone 0.125 mcg/kg/min (12/25/14 0809)    PRN Medications: sodium chloride, hydrALAZINE, morphine injection, ondansetron (ZOFRAN) IV, oxyCODONE, sodium chloride, sodium chloride, traMADol   Assessment:   1. Cardiogenic shock s/p HMII implant 12/20/2014 2. Acute/chonic systolic HF   -- NICM EF 0000000 3. Chronic AF with RVR 4. Expected EBL anemia 5. Acute on chronic renal failure 6. PNA  7. Anemia   Plan/Discussion:   S/P HMII LVAD implant on 9/1  Suspect he had component of ATN. No IV lasix . CVP 5-6. Will switch to po demadex tomorrow.   Hgb today 7.9. No evidence of bleeding. Continue Iron. Add colace.   Co-ox 79%. Milrinone cut back to 0.125 mcg this morning. Anticipate stopping milrinone tomorrow. PI up . Increase speed to 9200.   AF rate remains fast. Amio 200 mg twice a day. Can use low-dose b-blocker as needed.   Continue Vanc and Zosyn for probable PNA. WBC stable.    Had BM  9/5 . On Reglan now. Dr. Prescott Gum ordered sorbitol  INR 2.52. On coumadin. Pharmacy following.   I reviewed the LVAD parameters from today, and compared the results to the patient's prior recorded data.  No programming changes were made.  The LVAD is functioning within specified parameters.  The patient performs LVAD self-test daily.  LVAD interrogation was negative for any significant power changes, alarms or PI events/speed drops.  LVAD equipment check completed and is in good working order.  Back-up equipment present.   LVAD education done on emergency procedures and precautions and reviewed exit site care.  Length of Stay: 11  CLEGG,AMY NP-C 12/25/2014, 9:00 AM  VAD Team --- VAD ISSUES ONLY--- Pager [phone] (7am - 7am)  Advanced Heart Failure Team  Pager [phone] (M-F; 7a - 4p)  Please contact [facility] for night-coverage after hours (4p -7a ) and weekends on [email or web address]  Patient seen and examined with Darrick Grinder, NP. We discussed all aspects of the encounter. I agree with the assessment and plan as stated above.   Doing well. Feels stronger. Had BM last night. All tubes out. Diuresed well. Will hold diuretics today and start po torsemide tomorrow. VAD speed increased to 9200. Decrease milrinone to 0.125.   INR good. AF rate still fast will start low dose metoprolol.   VAD interrogate personally.   [name], Daniel,MD 9:26 AM

## 2014-12-26 ENCOUNTER — Inpatient Hospital Stay (HOSPITAL_COMMUNITY): Payer: 59

## 2014-12-26 LAB — CARBOXYHEMOGLOBIN
CARBOXYHEMOGLOBIN: 1.5 % (ref 0.5–1.5)
METHEMOGLOBIN: 1.1 % (ref 0.0–1.5)
O2 SAT: 54 %
TOTAL HEMOGLOBIN: 8.7 g/dL — AB (ref 13.5–18.0)

## 2014-12-26 LAB — CBC WITH DIFFERENTIAL/PLATELET
BASOS ABS: 0 10*3/uL (ref 0.0–0.1)
Basophils Relative: 0 % (ref 0–1)
Eosinophils Absolute: 0 10*3/uL (ref 0.0–0.7)
Eosinophils Relative: 0 % (ref 0–5)
HEMATOCRIT: 25.5 % — AB (ref 39.0–52.0)
Hemoglobin: 8.3 g/dL — ABNORMAL LOW (ref 13.0–17.0)
LYMPHS ABS: 1.5 10*3/uL (ref 0.7–4.0)
Lymphocytes Relative: 16 % (ref 12–46)
MCH: 20.4 pg — ABNORMAL LOW (ref 26.0–34.0)
MCHC: 32.5 g/dL (ref 30.0–36.0)
MCV: 62.7 fL — ABNORMAL LOW (ref 78.0–100.0)
MONOS PCT: 15 % — AB (ref 3–12)
Monocytes Absolute: 1.4 10*3/uL — ABNORMAL HIGH (ref 0.1–1.0)
NEUTROS ABS: 6.6 10*3/uL (ref 1.7–7.7)
Neutrophils Relative %: 69 % (ref 43–77)
Platelets: 365 10*3/uL (ref 150–400)
RBC: 4.07 MIL/uL — ABNORMAL LOW (ref 4.22–5.81)
RDW: 16.5 % — AB (ref 11.5–15.5)
WBC: 9.5 10*3/uL (ref 4.0–10.5)

## 2014-12-26 LAB — COMPREHENSIVE METABOLIC PANEL
ALT: 16 U/L — AB (ref 17–63)
ANION GAP: 12 (ref 5–15)
AST: 23 U/L (ref 15–41)
Albumin: 2.6 g/dL — ABNORMAL LOW (ref 3.5–5.0)
Alkaline Phosphatase: 86 U/L (ref 38–126)
BUN: 51 mg/dL — ABNORMAL HIGH (ref 6–20)
CHLORIDE: 91 mmol/L — AB (ref 101–111)
CO2: 31 mmol/L (ref 22–32)
Calcium: 8.9 mg/dL (ref 8.9–10.3)
Creatinine, Ser: 2.08 mg/dL — ABNORMAL HIGH (ref 0.61–1.24)
GFR calc non Af Amer: 37 mL/min — ABNORMAL LOW (ref >60)
GFR, EST AFRICAN AMERICAN: 42 mL/min — AB (ref >60)
Glucose, Bld: 115 mg/dL — ABNORMAL HIGH (ref 65–99)
POTASSIUM: 3.4 mmol/L — AB (ref 3.5–5.1)
SODIUM: 134 mmol/L — AB (ref 135–145)
Total Bilirubin: 1.7 mg/dL — ABNORMAL HIGH (ref 0.3–1.2)
Total Protein: 6.7 g/dL (ref 6.5–8.1)

## 2014-12-26 LAB — PROTIME-INR
INR: 4.45 — ABNORMAL HIGH (ref 0.00–1.49)
Prothrombin Time: 41.2 seconds — ABNORMAL HIGH (ref 11.6–15.2)

## 2014-12-26 LAB — MAGNESIUM: Magnesium: 2.2 mg/dL (ref 1.7–2.4)

## 2014-12-26 LAB — APTT: aPTT: 60 seconds — ABNORMAL HIGH (ref 24–37)

## 2014-12-26 LAB — PHOSPHORUS: PHOSPHORUS: 3.9 mg/dL (ref 2.5–4.6)

## 2014-12-26 LAB — LACTATE DEHYDROGENASE: LDH: 263 U/L — ABNORMAL HIGH (ref 98–192)

## 2014-12-26 MED ORDER — DIGOXIN 125 MCG PO TABS
0.1250 mg | ORAL_TABLET | Freq: Every day | ORAL | Status: DC
  Administered 2014-12-26: 0.125 mg via ORAL
  Filled 2014-12-26 (×2): qty 1

## 2014-12-26 MED ORDER — POTASSIUM CHLORIDE 10 MEQ/50ML IV SOLN
10.0000 meq | INTRAVENOUS | Status: AC
  Administered 2014-12-26 (×3): 10 meq via INTRAVENOUS
  Filled 2014-12-26 (×3): qty 50

## 2014-12-26 MED ORDER — ENSURE ENLIVE PO LIQD
237.0000 mL | Freq: Three times a day (TID) | ORAL | Status: DC
  Administered 2014-12-26 – 2015-01-08 (×17): 237 mL via ORAL

## 2014-12-26 MED ORDER — WARFARIN - PHARMACIST DOSING INPATIENT: Freq: Every day | Status: DC

## 2014-12-26 MED ORDER — POTASSIUM CHLORIDE CRYS ER 20 MEQ PO TBCR
40.0000 meq | EXTENDED_RELEASE_TABLET | Freq: Once | ORAL | Status: AC
  Administered 2014-12-26: 40 meq via ORAL
  Filled 2014-12-26: qty 2

## 2014-12-26 NOTE — Progress Notes (Signed)
Patient ID: [name], male   DOB: Sep 09, 1970, 44 y.o.   MRN: [id number]  HeartMate 2 Rounding Note  Subjective:    Feels better. Pain in left great toe better. Some pain in left knee. He has had some gout there before.  Walked some yesterday. Nausea better. Taking some liquids. Bowels working.  Remains on Milrinone 0.125 with Co-ox drop to 54% CVP 6 Wt =preop  LVAD INTERROGATION:  HeartMate II LVAD:  Flow 4.0 liters/min, speed 9200, power 5, PI 8.2.    Objective:    Vital Signs:   Temp:  [96.9 F (36.1 C)-98 F (36.7 C)] 97.7 F (36.5 C) (09/07 0807) Pulse Rate:  [36-137] 46 (09/07 0600) Resp:  [10-25] 19 (09/07 0800) SpO2:  [97 %-100 %] 99 % (09/07 0800) Weight:  [76.8 kg (169 lb 5 oz)] 76.8 kg (169 lb 5 oz) (09/07 0400) Last BM Date: 12/26/14 Mean arterial Pressure 85  Intake/Output:   Intake/Output Summary (Last 24 hours) at 12/26/14 0847 Last data filed at 12/26/14 0800  Gross per 24 hour  Intake 1289.6 ml  Output   1370 ml  Net  -80.4 ml     Physical Exam: General:  Well appearing. No resp difficulty HEENT: normal Neck: supple. JVP . Carotids 2+ bilat; no bruits. No lymphadenopathy or thryomegaly appreciated. Cor: normal heart sounds with LVAD hum present. Lungs: clear Abdomen: soft, nontender, nondistended. No hepatosplenomegaly. No bruits or masses. Good bowel sounds. Extremities: no cyanosis, clubbing, rash, edema. Mild swelling left great MTP joint. Neuro: alert & orientedx3, cranial nerves grossly intact. moves all 4 extremities w/o difficulty. Affect pleasant  Telemetry: A-fib 90-105  Labs: Basic Metabolic Panel:  Recent Labs Lab 12/22/14 0331  12/22/14 2153 12/23/14 0351 12/23/14 1457 12/24/14 0334 12/24/14 1608 12/25/14 0500 12/26/14 0517  NA 132*  < > 132* 130* 130* 130* 130* 132* 134*  K 5.0  < > 4.2 3.9 3.8 3.3* 3.6 3.7 3.4*  CL 99*  < > 97* 95* 91* 90* 89* 90* 91*  CO2 22  --  23 25  --  29  --  30 31  GLUCOSE 124*  < > 142*  117* 118* 120* 119* 106* 115*  BUN 19  < > 26* 28* 33* 34* 38* 41* 51*  CREATININE 1.58*  < > 1.91* 1.87* 2.00* 2.03* 2.00* 2.18* 2.08*  CALCIUM 8.8*  --  9.1 9.0  --  9.0  --  8.9 8.9  MG 2.1  --   --  2.0  --  2.1  --  2.1 2.2  PHOS 5.4*  --   --  5.5*  --  5.6*  --  5.5* 3.9  < > = values in this interval not displayed.  Liver Function Tests:  Recent Labs Lab 12/22/14 0331 12/23/14 0351 12/24/14 0334 12/25/14 0500 12/26/14 0517  AST 61* 42* 31 26 23   ALT 20 20 17  16* 16*  ALKPHOS 63 65 62 66 86  BILITOT 3.4* 2.6* 2.3* 1.8* 1.7*  PROT 6.2* 6.7 6.4* 6.3* 6.7  ALBUMIN 3.2* 2.7* 2.6* 2.5* 2.6*   No results for input(s): LIPASE, AMYLASE in the last 168 hours. No results for input(s): AMMONIA in the last 168 hours.  CBC:  Recent Labs Lab 12/22/14 0331  12/23/14 0351 12/23/14 1457 12/24/14 0334 12/24/14 1608 12/25/14 0500 12/26/14 0517  WBC 19.1*  --  16.6*  --  14.3*  --  10.2 9.5  NEUTROABS 14.7*  --  13.5*  --  11.6*  --  8.0* 6.6  HGB 9.5*  < > 8.8* 11.6* 8.7* 12.2* 7.9* 8.3*  HCT 28.6*  < > 27.2* 34.0* 26.1* 36.0* 23.7* 25.5*  MCV 64.7*  --  64.3*  --  62.9*  --  63.0* 62.7*  PLT 168  --  242  --  264  --  274 PENDING  < > = values in this interval not displayed.  INR:  Recent Labs Lab 12/22/14 0331 12/23/14 0351 12/24/14 0334 12/25/14 0500 12/26/14 0517  INR 1.53* 1.38 1.47 2.52* 4.45*   LDH 263 stable.  Other results:  EKG:   Imaging: Dg Chest Port 1 View  12/26/2014   CLINICAL DATA:  Left ventricular assist device placement.  EXAM: PORTABLE CHEST - 1 VIEW  COMPARISON:  December 25, 2014  FINDINGS: Right-sided central catheter tip is in the superior vena cava, stable. Right jugular catheter has been removed. Pacemaker lead tip is in the region the right ventricle. There is a left ventricular assist device. There is a left atrial appendage clamp. No pneumothorax. There is atelectasis in the left lower lobe. Right lung is clear. Stable cardiomegaly.  Pulmonary vascularity within normal limits. No adenopathy.  IMPRESSION: Left lower lobe atelectatic change, stable. Stable cardiomegaly. No pneumothorax. Note that right jugular catheter has been removed compared to 1 day prior.   Electronically Signed   By: Lowella Grip III M.D.   On: 12/26/2014 07:21   Dg Chest Port 1 View  12/25/2014   CLINICAL DATA:  Left ventricular assist device.  EXAM: PORTABLE CHEST - 1 VIEW  COMPARISON:  12/24/2014 .  FINDINGS: Right IJ line, right PICC line in stable position. AICD in left ventricular assist device in stable position. Prior median sternotomy. Left atrial appendage clip noted. Stable cardiomegaly. No pulmonary venous congestion. Low lung volumes with mild bibasilar and left mid lung field subsegmental atelectasis.  IMPRESSION: 1. Lines and tubes in stable position. 2. Prior median sternotomy. Left atrial appendage clip noted. AICD and left ventricular assist device in stable position. Stable cardiomegaly. No pulmonary venous congestion. 3. Low lung volumes with mild bibasilar and left mid lung field subsegmental atelectasis.   Electronically Signed   By: Marcello Moores  Register   On: 12/25/2014 07:30   Dg Abd Portable 1v  12/24/2014   CLINICAL DATA:  Vomiting and abdominal distention. Status post LVAD.  EXAM: PORTABLE ABDOMEN - 1 VIEW  COMPARISON:  11/09/2014, 12/03/2014  FINDINGS: Patient has had median sternotomy. A right ventricular lead identified. Patient has had placement of a left ventricular assist device. Upper portion overlies the cardiac apex. Lower portion overlies T12. Bowel gas pattern is nonobstructive.  IMPRESSION: Interval placement of left ventricular assist device.  Nonobstructed bowel gas pattern.   Electronically Signed   By: Nolon Nations M.D.   On: 12/24/2014 20:06      Medications:     Scheduled Medications: . allopurinol  100 mg Oral Daily  . amiodarone  200 mg Oral BID  . antiseptic oral rinse  7 mL Mouth Rinse QID  . aspirin EC   325 mg Oral Daily   Or  . aspirin  324 mg Per Tube Daily   Or  . aspirin  300 mg Rectal Daily  . bisacodyl  10 mg Oral Daily   Or  . bisacodyl  10 mg Rectal Daily  . citalopram  20 mg Oral Daily  . colchicine  0.6 mg Oral BID  . docusate sodium  200 mg Oral Daily  . ferrous fumarate-b12-vitamic  C-folic acid  1 capsule Oral TID PC  . pantoprazole  40 mg Oral Daily  . piperacillin-tazobactam (ZOSYN)  IV  3.375 g Intravenous Q8H  . potassium chloride  10 mEq Intravenous Q1 Hr x 3  . potassium chloride  40 mEq Oral Once  . sodium chloride  10-40 mL Intracatheter Q12H  . sodium chloride  3 mL Intravenous Q12H  . sorbitol  30 mL Oral q morning - 10a  . sorbitol  30 mL Oral Once  . vancomycin  750 mg Intravenous Once  . Warfarin - Pharmacist Dosing Inpatient   Does not apply q1800     Infusions: . sodium chloride Stopped (12/22/14 0600)  . sodium chloride 20 mL/hr at 12/26/14 0800  . sodium chloride 250 mL (12/26/14 0800)  . lactated ringers Stopped (12/20/14 2000)  . lactated ringers Stopped (12/22/14 0600)  . milrinone 0.125 mcg/kg/min (12/26/14 0800)     PRN Medications:  sodium chloride, hydrALAZINE, morphine injection, ondansetron (ZOFRAN) IV, oxyCODONE, sodium chloride, sodium chloride, traMADol   Assessment:   . Acute/chonic systolic HF , NICM EF 0000000 with Cardiogenic shock on milrinone and levophed, IABP preoop, s/p HMII implant 12/20/2014 2. Chronic atrial fib 3. Postop atrial flutter with RVR 4. Expected EBL anemia 5. Fever early postop, resolved 6. Acute post op non-oliguric renal failure  Plan/Discussion:    He is hemodynamically stable on Milrinone 0.125. With drop in Co-ox will leave him on this dose today. Pump parameters all look good with increased PI.  CVP low and at preop wt. BUN up slightly and creat stable at 2.0. Hold further diuretic.   Supratherapeutic on coumadin. Hold dose today and then pharmacy to manage.   Hgb improved.  Nausea  improved. Advance diet as tolerated.  Continue PT, IS  Transfer to 2W.  I reviewed the LVAD parameters from today, and compared the results to the patient's prior recorded data.  No programming changes were made.  The LVAD is functioning within specified parameters.  The patient performs LVAD self-test daily.  LVAD interrogation was negative for any significant power changes, alarms or PI events/speed drops.  LVAD equipment check completed and is in good working order.  Back-up equipment present.   LVAD education done on emergency procedures and precautions and reviewed exit site care.  Length of Stay: [address]  Gaye Pollack 12/26/2014, 8:47 AM

## 2014-12-26 NOTE — Progress Notes (Signed)
ANTIBIOTIC CONSULT NOTE - FOLLOW UP  Pharmacy Consult for vancomycin Indication: pneumonia  No Known Allergies  Patient Measurements: Height: 5\' 7"  (170.2 cm) Weight: 169 lb 5 oz (76.8 kg) IBW/kg (Calculated) : 66.1   Vital Signs: Temp: 97.9 F (36.6 C) (09/07 1231) Temp Source: Oral (09/07 1231) Pulse Rate: 97 (09/07 0959) Intake/Output from previous day: 09/06 0701 - 09/07 0700 In: 1305.1 [P.O.:150; I.V.:805.1; IV Piggyback:350] Out: [id number] [Urine:1695] Intake/Output from this shift: Total I/O In: 821.5 [P.O.:357; I.V.:164.5; IV Piggyback:300] Out: 200 [Urine:200]  Labs:  Recent Labs  12/24/14 0334 12/24/14 1608 12/25/14 0500 12/26/14 0517  WBC 14.3*  --  10.2 9.5  HGB 8.7* 12.2* 7.9* 8.3*  PLT 264  --  274 365  CREATININE 2.03* 2.00* 2.18* 2.08*   Estimated Creatinine Clearance: 41.5 mL/min (by C-G formula based on Cr of 2.08).  Recent Labs  12/24/14 2042 12/25/14 2110  VANCOTROUGH 26*  --   VANCORANDOM  --  20     Microbiology: Recent Results (from the past 720 hour(s))  Urine culture     Status: None   Collection Time: 11/30/14  3:32 PM  Result Value Ref Range Status   Specimen Description URINE, CLEAN CATCH  Final   Special Requests NONE  Final   Culture NO GROWTH 1 DAY  Final   Report Status 12/01/2014 FINAL  Final  MRSA PCR Screening     Status: None   Collection Time: 11/30/14  6:46 PM  Result Value Ref Range Status   MRSA by PCR NEGATIVE NEGATIVE Final    Comment:        The GeneXpert MRSA Assay (FDA approved for NASAL specimens only), is one component of a comprehensive MRSA colonization surveillance program. It is not intended to diagnose MRSA infection nor to guide or monitor treatment for MRSA infections.   MRSA PCR Screening     Status: None   Collection Time: 12/14/14  3:20 PM  Result Value Ref Range Status   MRSA by PCR NEGATIVE NEGATIVE Final    Comment:        The GeneXpert MRSA Assay (FDA approved for NASAL  specimens only), is one component of a comprehensive MRSA colonization surveillance program. It is not intended to diagnose MRSA infection nor to guide or monitor treatment for MRSA infections.   Surgical pcr screen     Status: Abnormal   Collection Time: 12/15/14  4:34 PM  Result Value Ref Range Status   MRSA, PCR NEGATIVE NEGATIVE Final   Staphylococcus aureus POSITIVE (A) NEGATIVE Final    Comment:        The Xpert SA Assay (FDA approved for NASAL specimens in patients over 34 years of age), is one component of a comprehensive surveillance program.  Test performance has been validated by [facility] for patients greater than or equal to 75 year old. It is not intended to diagnose infection nor to guide or monitor treatment.   Surgical pcr screen     Status: Abnormal   Collection Time: 12/17/14 11:26 AM  Result Value Ref Range Status   MRSA, PCR NEGATIVE NEGATIVE Final   Staphylococcus aureus POSITIVE (A) NEGATIVE Final    Comment:        The Xpert SA Assay (FDA approved for NASAL specimens in patients over 32 years of age), is one component of a comprehensive surveillance program.  Test performance has been validated by [facility] for patients greater than or equal to 58 year old. It is not  intended to diagnose infection nor to guide or monitor treatment.    Assessment: 44 yo male with HCAP is currently on vancomycin and zosyn.  Vancomycin trough was 39 on 9/5 at .~ 2100 and dose was held.  A vancomycin random drawn 9/6 at ~ 2100 is 20 mcg/ml.    Scr stable this am 2.0, uop 0.86ml/kg/hr. 1 x dose of vanc given this am. Will reassess in am further dosing. Currently on day #6/7, likely give 1 more dose 9/8 to complete 7d course  Anticoagulation: Eliquis PTA for afib & h/o DVT; heparin restarted low dose post VAD for [facility] now off with bump in INR  Warfarin MD>>Rx 9/7, INR 1.47> 2.52>4.45 after 4 doses 5/7.5/7.5/2.5>>hold dose tonight 9/7  - amio new post op H/h  low stable - add feSo4  Goal of Therapy:  Vancomycin trough level 15-20 mcg/ml  Plan:  -vancomycin 750 mg today 12/26/14 -No change in zosyn -Reassess vancomycin dosing in am -Hold warfarin tonight  Erin Hearing PharmD., BCPS Clinical Pharmacist Pager [phone] 12/26/2014 1:03 PM

## 2014-12-26 NOTE — Progress Notes (Addendum)
Nutrition Follow-up  DOCUMENTATION CODES:   Not applicable  INTERVENTION:   Ensure Enlive po TID, each supplement provides 350 kcal and 20 grams of protein  NUTRITION DIAGNOSIS:   Increased nutrient needs related to  (post op healing) as evidenced by estimated needs  GOAL:   Patient will meet greater than or equal to 90% of their needs  MONITOR:   PO intake, Supplement acceptance, Labs, Weight trends, I & O's  ASSESSMENT:   [name] is a 44 y.o. 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  Pt transferred from [facility] to 2S-Surgical ICU 9/1.  S/p procedure 9/1: INSERTION OF IMPLANTABLE LEFT VENTRICULAR ASSIST DEVICE   Pt's diet advanced to solids (ie HH/Carbohydrate Modified) 9/5.  Reports a poor appetite.  Seems depressed.  RN giving Ensure supplements which he is amenable to continue.  RD encouraged consumption to promote healing and recovery.  Nutrition focused physical exam completed.  No muscle or subcutaneous fat depletion noticed.  Diet Order:  Diet heart healthy/carb modified Room service appropriate?: Yes; Fluid consistency:: Thin  Skin:  Reviewed, no issues  Last BM:  9/7  Height:   Ht Readings from Last 1 Encounters:  12/20/14 5\' 7"  (1.702 m)    Weight:   Wt Readings from Last 1 Encounters:  12/26/14 169 lb 5 oz (76.8 kg)    Ideal Body Weight:  67.3 kg  BMI:  Body mass index is 26.51 kg/(m^2).  Estimated Nutritional Needs:   Kcal:  1800-2000  Protein:  90-100 grams  Fluid:  1.8-2.0 L  EDUCATION NEEDS:   No education needs identified at this time  Arthur Holms, RD, LDN Pager #: [phone] After-Hours Pager #: [phone]

## 2014-12-26 NOTE — Progress Notes (Signed)
VAD Team Rounding Note  Subjective:    s/p HMII implant 9/1  9/6 Milrinone cut back to 0.125 mcg.  VAD was increased to 9200.   Denies SOB. Complaining of L knee pain.   Hgb 8.3  Co-ox 54% Creatinine 2.1> 2.08  LVAD INTERROGATION:  HeartMate II LVAD:  Flow 4.2liters/min, speed 9190, power 5.0 ,  PI 8.4   8 PI events last 24 hours. few PI events   Objective:    Vital Signs:   Temp:  [96.9 F (36.1 C)-98 F (36.7 C)] 97.7 F (36.5 C) (09/07 0807) Pulse Rate:  [36-137] 46 (09/07 0600) Resp:  [10-25] 19 (09/07 0800) SpO2:  [97 %-100 %] 99 % (09/07 0800) Weight:  [169 lb 5 oz (76.8 kg)] 169 lb 5 oz (76.8 kg) (09/07 0400) Last BM Date: 12/25/14 Mean arterial Pressure 86  Intake/Output:   Intake/Output Summary (Last 24 hours) at 12/26/14 0818 Last data filed at 12/26/14 0800  Gross per 24 hour  Intake 1289.6 ml  Output   1370 ml  Net  -80.4 ml     Physical Exam: CVP ~6-7  General:lying in bed NAD HEENT: normal  Neck: supple.  Cor Sternal dressing in place.  Mechanical heart sounds with LVAD hum present. Lungs: Decreased in the bases.  Abdomen: soft, nontender, nondistended. hypoactive bowel sounds. Driveline: C/D/I; securement device intact and driveline incorporated Extremities: no cyanosis, clubbing, rash, R and LLE SCDs.  Neuro: awake conversant GU: Foley   Telemetry: AF 100-110s  Labs: Basic Metabolic Panel:  Recent Labs Lab 12/22/14 0331  12/22/14 2153 12/23/14 0351 12/23/14 1457 12/24/14 0334 12/24/14 1608 12/25/14 0500 12/26/14 0517  NA 132*  < > 132* 130* 130* 130* 130* 132* 134*  K 5.0  < > 4.2 3.9 3.8 3.3* 3.6 3.7 3.4*  CL 99*  < > 97* 95* 91* 90* 89* 90* 91*  CO2 22  --  23 25  --  29  --  30 31  GLUCOSE 124*  < > 142* 117* 118* 120* 119* 106* 115*  BUN 19  < > 26* 28* 33* 34* 38* 41* 51*  CREATININE 1.58*  < > 1.91* 1.87* 2.00* 2.03* 2.00* 2.18* 2.08*  CALCIUM 8.8*  --  9.1 9.0  --  9.0  --  8.9 8.9  MG 2.1  --   --  2.0   --  2.1  --  2.1 2.2  PHOS 5.4*  --   --  5.5*  --  5.6*  --  5.5* 3.9  < > = values in this interval not displayed.  Liver Function Tests:  Recent Labs Lab 12/22/14 0331 12/23/14 0351 12/24/14 0334 12/25/14 0500 12/26/14 0517  AST 61* 42* 31 26 23   ALT 20 20 17  16* 16*  ALKPHOS 63 65 62 66 86  BILITOT 3.4* 2.6* 2.3* 1.8* 1.7*  PROT 6.2* 6.7 6.4* 6.3* 6.7  ALBUMIN 3.2* 2.7* 2.6* 2.5* 2.6*   No results for input(s): LIPASE, AMYLASE in the last 168 hours. No results for input(s): AMMONIA in the last 168 hours.  CBC:  Recent Labs Lab 12/22/14 0331  12/23/14 0351 12/23/14 1457 12/24/14 0334 12/24/14 1608 12/25/14 0500 12/26/14 0517  WBC 19.1*  --  16.6*  --  14.3*  --  10.2 9.5  NEUTROABS 14.7*  --  13.5*  --  11.6*  --  8.0* 6.6  HGB 9.5*  < > 8.8* 11.6* 8.7* 12.2* 7.9*  8.3*  HCT 28.6*  < > 27.2* 34.0* 26.1* 36.0* 23.7* 25.5*  MCV 64.7*  --  64.3*  --  62.9*  --  63.0* 62.7*  PLT 168  --  242  --  264  --  274 PENDING  < > = values in this interval not displayed.  INR:  Recent Labs Lab 12/22/14 0331 12/23/14 0351 12/24/14 0334 12/25/14 0500 12/26/14 0517  INR 1.53* 1.38 1.47 2.52* 4.45*    Other results:   Imaging: Dg Chest Port 1 View  12/26/2014   CLINICAL DATA:  Left ventricular assist device placement.  EXAM: PORTABLE CHEST - 1 VIEW  COMPARISON:  December 25, 2014  FINDINGS: Right-sided central catheter tip is in the superior vena cava, stable. Right jugular catheter has been removed. Pacemaker lead tip is in the region the right ventricle. There is a left ventricular assist device. There is a left atrial appendage clamp. No pneumothorax. There is atelectasis in the left lower lobe. Right lung is clear. Stable cardiomegaly. Pulmonary vascularity within normal limits. No adenopathy.  IMPRESSION: Left lower lobe atelectatic change, stable. Stable cardiomegaly. No pneumothorax. Note that right jugular catheter has been removed compared to 1 day prior.    Electronically Signed   By: Lowella Grip III M.D.   On: 12/26/2014 07:21   Dg Chest Port 1 View  12/25/2014   CLINICAL DATA:  Left ventricular assist device.  EXAM: PORTABLE CHEST - 1 VIEW  COMPARISON:  12/24/2014 .  FINDINGS: Right IJ line, right PICC line in stable position. AICD in left ventricular assist device in stable position. Prior median sternotomy. Left atrial appendage clip noted. Stable cardiomegaly. No pulmonary venous congestion. Low lung volumes with mild bibasilar and left mid lung field subsegmental atelectasis.  IMPRESSION: 1. Lines and tubes in stable position. 2. Prior median sternotomy. Left atrial appendage clip noted. AICD and left ventricular assist device in stable position. Stable cardiomegaly. No pulmonary venous congestion. 3. Low lung volumes with mild bibasilar and left mid lung field subsegmental atelectasis.   Electronically Signed   By: Marcello Moores  Register   On: 12/25/2014 07:30   Dg Abd Portable 1v  12/24/2014   CLINICAL DATA:  Vomiting and abdominal distention. Status post LVAD.  EXAM: PORTABLE ABDOMEN - 1 VIEW  COMPARISON:  11/09/2014, 12/03/2014  FINDINGS: Patient has had median sternotomy. A right ventricular lead identified. Patient has had placement of a left ventricular assist device. Upper portion overlies the cardiac apex. Lower portion overlies T12. Bowel gas pattern is nonobstructive.  IMPRESSION: Interval placement of left ventricular assist device.  Nonobstructed bowel gas pattern.   Electronically Signed   By: Nolon Nations M.D.   On: 12/24/2014 20:06     Medications:     Scheduled Medications: . allopurinol  100 mg Oral Daily  . amiodarone  200 mg Oral BID  . antiseptic oral rinse  7 mL Mouth Rinse QID  . aspirin EC  325 mg Oral Daily   Or  . aspirin  324 mg Per Tube Daily   Or  . aspirin  300 mg Rectal Daily  . bisacodyl  10 mg Oral Daily   Or  . bisacodyl  10 mg Rectal Daily  . citalopram  20 mg Oral Daily  . colchicine  0.6 mg Oral BID   . docusate sodium  200 mg Oral Daily  . ferrous Q000111Q C-folic acid  1 capsule Oral TID PC  . pantoprazole  40 mg Oral Daily  . piperacillin-tazobactam (  ZOSYN)  IV  3.375 g Intravenous Q8H  . potassium chloride  10 mEq Intravenous Q1 Hr x 3  . sodium chloride  10-40 mL Intracatheter Q12H  . sodium chloride  3 mL Intravenous Q12H  . sorbitol  30 mL Oral q morning - 10a  . sorbitol  30 mL Oral Once  . vancomycin  750 mg Intravenous Once  . warfarin  2.5 mg Oral q1800  . Warfarin - Physician Dosing Inpatient   Does not apply q1800    Infusions: . sodium chloride Stopped (12/22/14 0600)  . sodium chloride 20 mL/hr at 12/26/14 0800  . sodium chloride 250 mL (12/26/14 0800)  . lactated ringers Stopped (12/20/14 2000)  . lactated ringers Stopped (12/22/14 0600)  . milrinone 0.125 mcg/kg/min (12/26/14 0800)    PRN Medications: sodium chloride, hydrALAZINE, morphine injection, ondansetron (ZOFRAN) IV, oxyCODONE, sodium chloride, sodium chloride, traMADol   Assessment:   1. Cardiogenic shock s/p HMII implant 12/20/2014 2. Acute/chonic systolic HF   -- NICM EF 0000000 3. Chronic AF with RVR 4. Expected EBL anemia 5. Acute on chronic renal failure 6. PNA  7. Anemia   Plan/Discussion:   S/P HMII LVAD implant on 9/1   Suspect he had component of ATN. No IV lasix . CVP 5-6. Will switch to po demadex tomorrow.   Hgb 8.3 . No evidence of bleeding. Continue Iron+ colace.   Todays CO-OX down 54%. Continue milrinone 0.125 mcg. Follow up CO-OX in am.   PI  Than 8. Continue speed at 9200. May need to increase to 9400 but will see how he does ambulating.    AF rate 100-110.  Amio 200 mg twice a day. Can use low-dose b-blocker as needed.   Day 6/7 Vanc. Day 4/7 Zosyn. Continue Vanc and Zosyn for probable PNA. WBC stable.  Needs 7 days.   Had BM 9/7 . On Reglan now. Dr. Prescott Gum ordered sorbitol  INR 4.45  On coumadin. Pharmacy following.   I reviewed the LVAD  parameters from today, and compared the results to the patient's prior recorded data.  No programming changes were made.  The LVAD is functioning within specified parameters.  The patient performs LVAD self-test daily.  LVAD interrogation was negative for any significant power changes, alarms or PI events/speed drops.  LVAD equipment check completed and is in good working order.  Back-up equipment present.   LVAD education done on emergency procedures and precautions and reviewed exit site care.  Length of Stay: 12  CLEGG,AMY NP-C 12/26/2014, 8:18 AM  VAD Team --- VAD ISSUES ONLY--- Pager [phone] (7am - 7am)  Advanced Heart Failure Team  Pager [phone] (M-F; 7a - 4p)    Patient seen and examined with Darrick Grinder, NP. We discussed all aspects of the encounter. I agree with the assessment and plan as stated above.   Continues to improve. Volume status looks good. Did not tolerate lopressor well. Would hold diuretics. Start digoxin. Finish 7-day course abx. Can go to [facility] bed today. Would continue milrinone at least one more day. Will hold VAD speed at 9200 for now. Consider increase to 9400 tomorrow.   VAD parameters look good.   Yari Szeliga,MD 8:51 AM

## 2014-12-26 NOTE — Progress Notes (Signed)
[name] continues to improve each day - looks very well today. Still feeling weak and complains of decreased appetite but hopeful this will slowly improve along with his recovery - agree with supplementation in the mean time. Says he slept well last night and walking in halls today. Encouraged him that we are happy with his progress and just give the rest of his body time to catch up. Seems in better spirits each day.   Vinie Sill, NP Palliative Medicine Team Pager # [phone] (M-F 8a-5p) Team Phone # [phone] (Nights/Weekends)

## 2014-12-26 NOTE — Progress Notes (Signed)
CSW met with patient at bedside. Patient states he is doing well but tired today. He states he has ambulated around the unit and pleased with his progress. Patient states his sisters will return on Friday from [place]. Patient appears to be adjusting well to VAD implant. CSW will continue to follow for supportive intervention. Raquel Sarna, [place]

## 2014-12-26 NOTE — Progress Notes (Signed)
Admitted 12/14/14 per Dr. Haroldine Laws due to acute/chonic systolic HF with cardiogenic shock.  HeartMate II LVAD implated on 12/20/14 by Dr. Cyndia Bent as destination therapy VAD.    Vital signs: Temp:  96.9 - 98.0 HR:  A fib 96 - 120's Doppler MAP: 85 O2 Sat: 97 - 100% 1L  Wt in lbs:  169 > 187> 181> 179> 175 > 169  Intra-op blood products: 4FFP 1 plt 1100 cell saver  Gtts: Milrinone 0.125  Nitric Oxide: Off 12/23/14  Anticoagulants: ASA 325mg  started 12/21/14 Coumadin started 12/22/14  INR Goal 2 - 3  LVAD interrogation reveals:  Speed:  9190 Flow:  4.2 Power: 5.0 PI: 8.4 Alarms:  None Events:  8 PI events over last 24 hours Fixed speed:  9200 Low speed limit:  8600  CVP:  6  Drive Line:   Gauze dressing intact with attachment device accurately applied; being changed daily.   Arrhythmia:  12/20/14: AFlutter/AFib with RVR requiring multiple boluses and continuous infusion Amiodarone.  Switched to PO amiodarone 200 mg bid on 12/24/14   Labs:   Co-ox:  54 > 59> 52> 67.3> 67.9> 67.6> 78.6 > 54  Hgb:  11.2 > 8.3> 8.7> 7.9 (Iron PO started) > 8.3  WBC:  8.5 > 9.5> 14.5> 19.1> 16.6> 14.3> 10.2 > 9.5  LDH: 279> 220> 305 (peak)> 286> 254> 264 > 263  INR: 1.54> 1.53> 1.38> 1.47> 2.52 > 4.45  Creatinine: 1.20 - 1.4 (baseline pre-op)> 1.29> 1.58> 1.80> 1.91. 2.0> 2.03> 2.0> 2.18 > 2.08  Plan/Recommendation as D/W Team:  1. Dressing changes are being maintained by bedside nurses at this point. Two sisters Caren Griffins and Jeannene Patella are only available on weekends for now (they both work in [employer] [facility]). Will need to have bedside nursing support regarding teaching family sterile technique and DL dressing maintenance.    2.  Co-ox down this am; CVP 6;  PI 8's - will hold off on increasing VAD speed this am. Plan to get pt up, walk and monitor for drop in PI.   3. HR 90 - 100's; only up in 120's with activity. Hemodynamics/VAD #'s sagged yesterday with Lopressor, so Lopressor stopped, will monitor  HR.  4.  INR suptratherapeutic today, pharmacy to manage.

## 2014-12-26 NOTE — Progress Notes (Signed)
Pt transferred to 2w rm 25. Vitals per flowsheet. Report given to Mickel Baas B. RN

## 2014-12-26 NOTE — Progress Notes (Signed)
CSW met with patient at bedside. Patient reports he is feeling better today and not as sleepy. Patient reports he had a good night sleep and was able to walk the unit this morning. Patient's friends have been visiting and are supportive. Patient noted that his appetite is not as good as usual and he is bothered that it is not returning. CSW provided supportive intervention and encouraged patient to take it day by day as he body heals. Patient appeared comforted and supported during his recovery. CSW will continue to follow throughout recovery. Raquel Sarna, [place]

## 2014-12-26 NOTE — Progress Notes (Signed)
Physical Therapy Treatment Patient Details Name: [name] MRN: [id number] DOB: 1971-01-23 Today's Date: 12/26/2014    History of Present Illness Patient is a 44 yo male admitted 12/14/14 with heart failure.  Patient s/p LVAD on 12/20/14.  PMH:  NICM, EF 20-25%, CHF, ICD, Afib, HTN, severe MR/TR, RA    PT Comments    Continues to slowly improve. Ambulated 160 ft with only standing rest breaks (x2). HR 112-132 (mostly 120s during walk). Requiring maximal cues for switching to/from his batteries (multiple times attempting to connect wrong lines and unable to reason through what he was doing wrong). Ready to begin walking with rollator to help in determining d/c DME needs.   Follow Up Recommendations  Home health PT;Supervision/Assistance - 24 hour     Equipment Recommendations  Other (comment) (4 wheeled walker with seat)    Recommendations for Other Services       Precautions / Restrictions Precautions Precautions: Sternal;Fall Precaution Comments: LVAD; Patient unable to recall precautions. The patient needs reinforcement to maintain precautions with functional mobility.   Restrictions Weight Bearing Restrictions:  (sternal precautions) Other Position/Activity Restrictions: Sternal precautions    Mobility  Bed Mobility Overal bed mobility: Needs Assistance Bed Mobility: Sit to Sidelying;Rolling Rolling: Min assist (Rt side to back)       Sit to sidelying: Mod assist;+2 for physical assistance General bed mobility comments: despite cues for sequencing, pt began rotating shoulders to lie supine vs sidelying, incr assist to guide movement as desired  Transfers Overall transfer level: Needs assistance Equipment used: Pushed w/c Transfers: Sit to/from Stand Sit to Stand: Min assist;+2 physical assistance;+2 safety/equipment;From elevated surface         General transfer comment: Verbal cues for technique.    Ambulation/Gait Ambulation/Gait assistance: Min guard;+2  safety/equipment Ambulation Distance (Feet): 160 Feet Assistive device:  (push w/c) Gait Pattern/deviations: Step-through pattern;Decreased stride length;Decreased stance time - left;Decreased weight shift to left;Antalgic Gait velocity: decreased   General Gait Details: primarily limited by gout pain in LLE and inability to put weight thru UEs to offload due to sternal precautions; very slow with standing rest x 2   Stairs            Wheelchair Mobility    Modified Rankin (Stroke Patients Only)       Balance           Standing balance support: No upper extremity supported Standing balance-Leahy Scale: Poor                      Cognition Arousal/Alertness: Awake/alert Behavior During Therapy: WFL for tasks assessed/performed Overall Cognitive Status: Within Functional Limits for tasks assessed                      Exercises Other Exercises Other Exercises: Pt changed from main power source to battery power with maximal cuing. 3 times as transitioning onto/off of batteries, pt attempted to hook wrong lines together and could not recognize his error or choose which two pieces he needed to hook together when presented with a choice. Pt with less difficulty today unscrewing and reconneting lines. Pt able to get red arrows lined up to put batteries in clips. Total assist to don vest due to RUE PICC line. Pt currently minguard A for getting batteries in vest and control in holder due to weakness and multiple lines/tubes. Pt able to state what 3 items he needed to have in his bag.    General  Comments        Pertinent Vitals/Pain Pain Assessment: 0-10 Pain Score: 8  Pain Location: Lt knee Pain Descriptors / Indicators: Sharp Pain Intervention(s): Limited activity within patient's tolerance;Monitored during session;Premedicated before session;Repositioned    Home Living                      Prior Function            PT Goals (current goals  can now be found in the care plan section) Acute Rehab PT Goals Patient Stated Goal: get home Time For Goal Achievement: 12/29/14 Progress towards PT goals: Progressing toward goals    Frequency  Min 3X/week    PT Plan Current plan remains appropriate    Co-evaluation             End of Session Equipment Utilized During Treatment: Oxygen Activity Tolerance: Patient limited by fatigue Patient left: in bed;with call bell/phone within reach;with nursing/sitter in room     Time: [id number] PT Time Calculation (min) (ACUTE ONLY): 38 min  Charges:  $Gait Training: 23-37 mins $Self Care/Home Management: 8-22                    G Codes:      Aadan Chenier 01/09/2015, 11:23 AM Pager [phone]

## 2014-12-27 ENCOUNTER — Inpatient Hospital Stay (HOSPITAL_COMMUNITY): Payer: 59

## 2014-12-27 ENCOUNTER — Ambulatory Visit (HOSPITAL_COMMUNITY): Payer: 59

## 2014-12-27 DIAGNOSIS — I509 Heart failure, unspecified: Secondary | ICD-10-CM

## 2014-12-27 DIAGNOSIS — R040 Epistaxis: Secondary | ICD-10-CM

## 2014-12-27 LAB — COMPREHENSIVE METABOLIC PANEL
ALBUMIN: 2.6 g/dL — AB (ref 3.5–5.0)
ALK PHOS: 115 U/L (ref 38–126)
ALT: 19 U/L (ref 17–63)
ANION GAP: 11 (ref 5–15)
AST: 31 U/L (ref 15–41)
BUN: 46 mg/dL — ABNORMAL HIGH (ref 6–20)
CALCIUM: 8.9 mg/dL (ref 8.9–10.3)
CHLORIDE: 95 mmol/L — AB (ref 101–111)
CO2: 29 mmol/L (ref 22–32)
CREATININE: 1.61 mg/dL — AB (ref 0.61–1.24)
GFR calc non Af Amer: 50 mL/min — ABNORMAL LOW (ref >60)
GFR, EST AFRICAN AMERICAN: 58 mL/min — AB (ref >60)
GLUCOSE: 110 mg/dL — AB (ref 65–99)
Potassium: 4.1 mmol/L (ref 3.5–5.1)
SODIUM: 135 mmol/L (ref 135–145)
Total Bilirubin: 1.4 mg/dL — ABNORMAL HIGH (ref 0.3–1.2)
Total Protein: 6.9 g/dL (ref 6.5–8.1)

## 2014-12-27 LAB — POCT I-STAT, CHEM 8
BUN: 38 mg/dL — AB (ref 6–20)
CALCIUM ION: 1.16 mmol/L (ref 1.12–1.23)
Chloride: 96 mmol/L — ABNORMAL LOW (ref 101–111)
Creatinine, Ser: 1.5 mg/dL — ABNORMAL HIGH (ref 0.61–1.24)
GLUCOSE: 130 mg/dL — AB (ref 65–99)
HCT: 29 % — ABNORMAL LOW (ref 39.0–52.0)
Hemoglobin: 9.9 g/dL — ABNORMAL LOW (ref 13.0–17.0)
Potassium: 3.6 mmol/L (ref 3.5–5.1)
SODIUM: 137 mmol/L (ref 135–145)
TCO2: 31 mmol/L (ref 0–100)

## 2014-12-27 LAB — CBC
HCT: 26.4 % — ABNORMAL LOW (ref 39.0–52.0)
HCT: 26 % — ABNORMAL LOW (ref 39.0–52.0)
Hemoglobin: 8.4 g/dL — ABNORMAL LOW (ref 13.0–17.0)
Hemoglobin: 8.4 g/dL — ABNORMAL LOW (ref 13.0–17.0)
MCH: 20.3 pg — ABNORMAL LOW (ref 26.0–34.0)
MCH: 21 pg — ABNORMAL LOW (ref 26.0–34.0)
MCHC: 31.8 g/dL (ref 30.0–36.0)
MCHC: 32.3 g/dL (ref 30.0–36.0)
MCV: 63.8 fL — ABNORMAL LOW (ref 78.0–100.0)
MCV: 65 fL — ABNORMAL LOW (ref 78.0–100.0)
Platelets: 526 10*3/uL — ABNORMAL HIGH (ref 150–400)
Platelets: 496 10*3/uL — ABNORMAL HIGH (ref 150–400)
RBC: 4.14 MIL/uL — ABNORMAL LOW (ref 4.22–5.81)
RBC: 4 MIL/uL — ABNORMAL LOW (ref 4.22–5.81)
RDW: 17.4 % — ABNORMAL HIGH (ref 11.5–15.5)
RDW: 17.2 % — ABNORMAL HIGH (ref 11.5–15.5)
WBC: 12 10*3/uL — ABNORMAL HIGH (ref 4.0–10.5)
WBC: 12.5 10*3/uL — ABNORMAL HIGH (ref 4.0–10.5)

## 2014-12-27 LAB — CBC WITH DIFFERENTIAL/PLATELET
BASOS ABS: 0 10*3/uL (ref 0.0–0.1)
BASOS PCT: 0 % (ref 0–1)
EOS ABS: 0.1 10*3/uL (ref 0.0–0.7)
EOS PCT: 1 % (ref 0–5)
HCT: 27.5 % — ABNORMAL LOW (ref 39.0–52.0)
Hemoglobin: 9 g/dL — ABNORMAL LOW (ref 13.0–17.0)
Lymphocytes Relative: 16 % (ref 12–46)
Lymphs Abs: 1.8 10*3/uL (ref 0.7–4.0)
MCH: 21.1 pg — ABNORMAL LOW (ref 26.0–34.0)
MCHC: 32.7 g/dL (ref 30.0–36.0)
MCV: 64.6 fL — ABNORMAL LOW (ref 78.0–100.0)
MONO ABS: 1.6 10*3/uL — AB (ref 0.1–1.0)
MONOS PCT: 14 % — AB (ref 3–12)
NEUTROS ABS: 8 10*3/uL — AB (ref 1.7–7.7)
Neutrophils Relative %: 70 % (ref 43–77)
PLATELETS: 435 10*3/uL — AB (ref 150–400)
RBC: 4.26 MIL/uL (ref 4.22–5.81)
RDW: 17.2 % — AB (ref 11.5–15.5)
WBC: 11.5 10*3/uL — ABNORMAL HIGH (ref 4.0–10.5)

## 2014-12-27 LAB — BRAIN NATRIURETIC PEPTIDE: B NATRIURETIC PEPTIDE 5: 781 pg/mL — AB (ref 0.0–100.0)

## 2014-12-27 LAB — PROTIME-INR
INR: 4.23 — ABNORMAL HIGH (ref 0.00–1.49)
INR: 2.41 — AB (ref 0.00–1.49)
PROTHROMBIN TIME: 25.9 s — AB (ref 11.6–15.2)
Prothrombin Time: 39.6 seconds — ABNORMAL HIGH (ref 11.6–15.2)

## 2014-12-27 LAB — CARBOXYHEMOGLOBIN
Carboxyhemoglobin: 1.4 % (ref 0.5–1.5)
Carboxyhemoglobin: 1.8 % — ABNORMAL HIGH (ref 0.5–1.5)
METHEMOGLOBIN: 0.7 % (ref 0.0–1.5)
Methemoglobin: 0.8 % (ref 0.0–1.5)
O2 Saturation: 55.5 %
O2 Saturation: 54.9 %
Total hemoglobin: 9.2 g/dL — ABNORMAL LOW (ref 13.5–18.0)
Total hemoglobin: 7.8 g/dL — ABNORMAL LOW (ref 13.5–18.0)

## 2014-12-27 LAB — APTT: aPTT: 77 seconds — ABNORMAL HIGH (ref 24–37)

## 2014-12-27 LAB — LACTATE DEHYDROGENASE: LDH: 303 U/L — AB (ref 98–192)

## 2014-12-27 LAB — PHOSPHORUS: PHOSPHORUS: 2 mg/dL — AB (ref 2.5–4.6)

## 2014-12-27 LAB — MAGNESIUM: Magnesium: 2.5 mg/dL — ABNORMAL HIGH (ref 1.7–2.4)

## 2014-12-27 MED ORDER — AMIODARONE IV BOLUS ONLY 150 MG/100ML
150.0000 mg | Freq: Once | INTRAVENOUS | Status: AC
  Administered 2014-12-27: 150 mg via INTRAVENOUS
  Filled 2014-12-27: qty 100

## 2014-12-27 MED ORDER — AMIODARONE HCL IN DEXTROSE 360-4.14 MG/200ML-% IV SOLN
30.0000 mg/h | INTRAVENOUS | Status: DC
  Administered 2014-12-27 – 2014-12-29 (×7): 30 mg/h via INTRAVENOUS
  Filled 2014-12-27 (×17): qty 200

## 2014-12-27 MED ORDER — VANCOMYCIN HCL IN DEXTROSE 750-5 MG/150ML-% IV SOLN
750.0000 mg | Freq: Once | INTRAVENOUS | Status: AC
Start: 2014-12-27 — End: 2014-12-27
  Administered 2014-12-27: 750 mg via INTRAVENOUS
  Filled 2014-12-27: qty 150

## 2014-12-27 MED ORDER — DIGOXIN 0.25 MG/ML IJ SOLN
0.1250 mg | Freq: Every day | INTRAMUSCULAR | Status: DC
  Administered 2014-12-27 – 2014-12-31 (×5): 0.125 mg via INTRAVENOUS
  Filled 2014-12-27 (×6): qty 0.5

## 2014-12-27 MED ORDER — AMIODARONE HCL IN DEXTROSE 360-4.14 MG/200ML-% IV SOLN
INTRAVENOUS | Status: AC
  Filled 2014-12-27: qty 200

## 2014-12-27 MED ORDER — METOCLOPRAMIDE HCL 5 MG/ML IJ SOLN
10.0000 mg | Freq: Four times a day (QID) | INTRAMUSCULAR | Status: DC
  Administered 2014-12-27 – 2015-01-06 (×39): 10 mg via INTRAVENOUS
  Filled 2014-12-27 (×47): qty 2

## 2014-12-27 MED ORDER — SODIUM CHLORIDE 0.9 % IV SOLN: Freq: Once | INTRAVENOUS | Status: DC

## 2014-12-27 MED ORDER — AMIODARONE HCL IN DEXTROSE 360-4.14 MG/200ML-% IV SOLN: 30.0000 mg/h | INTRAVENOUS | Status: DC

## 2014-12-27 NOTE — Progress Notes (Signed)
While RN at bedside, noticed pt had coughed up a minimal amount of blood mixed with mucous.  Pt stated he felt fine and that his throat wasn't sore.  Instructed pt to let RN know if he coughs up any more blood.  Will cont to monitor pt closely.  [name]

## 2014-12-27 NOTE — Progress Notes (Signed)
Patient transferred back to ICU this morning. Patient's friend Lannette Donath at bedside. She reports she has been in communication with patient's sisters who live in [place] and will update them on his progress. Family very supportive and plan to visit this weekend. Patient resting in bed. CSW provided contact information to Lavina and will continue to follow throughout recovery. Raquel Sarna, [place]

## 2014-12-27 NOTE — Progress Notes (Signed)
VAD Team Rounding Note  Subjective:    s/p HMII implant 9/1  9/6 Milrinone cut back to 0.125 mcg.  VAD was increased to 9200.  9/7 Continued on milrinone 0.125 mcg. INR 4.4 Transferred to 2W 9/8 Developed epitaxis. Coughing up clots. INR 4.23 CXR ok    Mild dyspnea . Complaining of N/V  Complaining of R clavicle pain.   Hgb 8.3 >9 Co-ox 54>55 Creatinine 2.1> 2.08>1.6   LVAD INTERROGATION:  HeartMate II LVAD:  Flow 4.5liters/min, speed 9190, power 5.0 ,  PI 8   3 PI events last 24 hours. few PI events   Objective:    Vital Signs:   Temp:  [97.7 F (36.5 C)-98.3 F (36.8 C)] 97.9 F (36.6 C) (09/07 2232) Pulse Rate:  [97-104] 97 (09/07 0959) Resp:  [11-28] 20 (09/08 0215) SpO2:  [96 %-100 %] 97 % (09/08 0215) Last BM Date: 12/26/14 Mean arterial Pressure 88  Intake/Output:   Intake/Output Summary (Last 24 hours) at 12/27/14 0700 Last data filed at 12/26/14 1900  Gross per 24 hour  Intake 1221.8 ml  Output    550 ml  Net  671.8 ml     Physical Exam:  General:lying in bed. Vomiting.  HEENT: normal  Neck: supple. JVP 6-7  Cor Sternal dressing in place.  Mechanical heart sounds with LVAD hum present. Lungs: Decreased in the bases.  Abdomen: soft, nontender, nondistended. hypoactive bowel sounds. Driveline: C/D/I; securement device intact and driveline incorporated Extremities: no cyanosis, clubbing, rash, R and LLE SCDs.  Neuro: awake conversant GU: Foley   Telemetry: AF 120-130s 10s  Labs: Basic Metabolic Panel:  Recent Labs Lab 12/23/14 0351  12/24/14 0334 12/24/14 1608 12/25/14 0500 12/26/14 0517 12/27/14 0047  NA 130*  < > 130* 130* 132* 134* 135  K 3.9  < > 3.3* 3.6 3.7 3.4* 4.1  CL 95*  < > 90* 89* 90* 91* 95*  CO2 25  --  29  --  30 31 29   GLUCOSE 117*  < > 120* 119* 106* 115* 110*  BUN 28*  < > 34* 38* 41* 51* 46*  CREATININE 1.87*  < > 2.03* 2.00* 2.18* 2.08* 1.61*  CALCIUM 9.0  --  9.0  --  8.9 8.9 8.9  MG 2.0  --  2.1  --   2.1 2.2 2.5*  PHOS 5.5*  --  5.6*  --  5.5* 3.9 2.0*  < > = values in this interval not displayed.  Liver Function Tests:  Recent Labs Lab 12/23/14 0351 12/24/14 0334 12/25/14 0500 12/26/14 0517 12/27/14 0047  AST 42* 31 26 23 31   ALT 20 17 16* 16* 19  ALKPHOS 65 62 66 86 115  BILITOT 2.6* 2.3* 1.8* 1.7* 1.4*  PROT 6.7 6.4* 6.3* 6.7 6.9  ALBUMIN 2.7* 2.6* 2.5* 2.6* 2.6*   No results for input(s): LIPASE, AMYLASE in the last 168 hours. No results for input(s): AMMONIA in the last 168 hours.  CBC:  Recent Labs Lab 12/23/14 0351  12/24/14 0334 12/24/14 1608 12/25/14 0500 12/26/14 0517 12/27/14 0047  WBC 16.6*  --  14.3*  --  10.2 9.5 11.5*  NEUTROABS 13.5*  --  11.6*  --  8.0* 6.6 8.0*  HGB 8.8*  < > 8.7* 12.2* 7.9* 8.3* 9.0*  HCT 27.2*  < > 26.1* 36.0* 23.7* 25.5* 27.5*  MCV 64.3*  --  62.9*  --  63.0* 62.7* 64.6*  PLT 242  --  264  --  274 365 435*  < > = values in this interval not displayed.  INR:  Recent Labs Lab 12/23/14 0351 12/24/14 0334 12/25/14 0500 12/26/14 0517 12/27/14 0047  INR 1.38 1.47 2.52* 4.45* 4.23*    Other results:   Imaging: Dg Chest Port 1 View  12/26/2014   CLINICAL DATA:  Left ventricular assist device placement.  EXAM: PORTABLE CHEST - 1 VIEW  COMPARISON:  December 25, 2014  FINDINGS: Right-sided central catheter tip is in the superior vena cava, stable. Right jugular catheter has been removed. Pacemaker lead tip is in the region the right ventricle. There is a left ventricular assist device. There is a left atrial appendage clamp. No pneumothorax. There is atelectasis in the left lower lobe. Right lung is clear. Stable cardiomegaly. Pulmonary vascularity within normal limits. No adenopathy.  IMPRESSION: Left lower lobe atelectatic change, stable. Stable cardiomegaly. No pneumothorax. Note that right jugular catheter has been removed compared to 1 day prior.   Electronically Signed   By: Lowella Grip III M.D.   On: 12/26/2014 07:21      Medications:     Scheduled Medications: . allopurinol  100 mg Oral Daily  . amiodarone  200 mg Oral BID  . antiseptic oral rinse  7 mL Mouth Rinse QID  . aspirin EC  325 mg Oral Daily   Or  . aspirin  324 mg Per Tube Daily   Or  . aspirin  300 mg Rectal Daily  . bisacodyl  10 mg Oral Daily   Or  . bisacodyl  10 mg Rectal Daily  . citalopram  20 mg Oral Daily  . digoxin  0.125 mg Oral Daily  . docusate sodium  200 mg Oral Daily  . feeding supplement (ENSURE ENLIVE)  237 mL Oral TID BM  . ferrous Q000111Q C-folic acid  1 capsule Oral TID PC  . pantoprazole  40 mg Oral Daily  . piperacillin-tazobactam (ZOSYN)  IV  3.375 g Intravenous Q8H  . sodium chloride  10-40 mL Intracatheter Q12H  . sodium chloride  3 mL Intravenous Q12H  . sorbitol  30 mL Oral q morning - 10a  . sorbitol  30 mL Oral Once  . Warfarin - Pharmacist Dosing Inpatient   Does not apply q1800    Infusions: . sodium chloride Stopped (12/22/14 0600)  . sodium chloride 20 mL/hr at 12/26/14 1900  . sodium chloride 250 mL (12/26/14 1900)  . lactated ringers Stopped (12/20/14 2000)  . lactated ringers Stopped (12/22/14 0600)  . milrinone 0.125 mcg/kg/min (12/26/14 1900)    PRN Medications: sodium chloride, hydrALAZINE, morphine injection, ondansetron (ZOFRAN) IV, oxyCODONE, sodium chloride, sodium chloride, traMADol   Assessment:   1. Cardiogenic shock s/p HMII implant 12/20/2014 2. Acute/chonic systolic HF   -- NICM EF 0000000 3. Chronic AF with RVR 4. Expected EBL anemia 5. Acute on chronic renal failure 6. PNA  7. Anemia  8. Epistaxis 9. Hemoptysis 10 Nausea  Plan/Discussion:   S/P HMII LVAD implant on 9/1   Developed nose bleed over night. Rhino Rocket in place in B nares. Hgb today is 9.0.  Hemoptysis. CXR reviewed by Dr Lawson Fiscal. Check KUB now. Check  CBC now. Give zofran as needed for nausea. Move back to ICU.    Renal function coming down.  Hold off on diuretic with  bleeding issues this morning. Check VSS every 2 hours.   Todays CO-OX down 56%. Continue milrinone 0.125 mcg.   Continue speed at 9200  with epitaxis. CXR reviewed by Dr Darcey Nora no effusion noted.   AF rate 120-140s. Stop po amio start amio drip 30 mg per hour.  Poor response to BB on 9/6 would not use with RV. INR supra-therapeutic. Hold today.    Day 7/7 Vanc. Day 5/7 Zosyn. Continue Vanc and Zosyn for probable PNA. WBC up a little 9.5>11.5    Had BM 9/7 . On Reglan now.   INR 4.45>4.23. Hold coumadin today.  Pharmacy following.   I reviewed the LVAD parameters from today, and compared the results to the patient's prior recorded data.  No programming changes were made.  The LVAD is functioning within specified parameters.  The patient performs LVAD self-test daily.  LVAD interrogation was negative for any significant power changes, alarms or PI events/speed drops.  LVAD equipment check completed and is in good working order.  Back-up equipment present.   LVAD education done on emergency procedures and precautions and reviewed exit site care.  Length of Stay: 13  CLEGG,AMY NP-C 12/27/2014, 7:00 AM  VAD Team --- VAD ISSUES ONLY--- Pager [phone] (7am - 7am)  Advanced Heart Failure Team  Pager [phone] (M-F; 7a - 4p)   Patient seen and examined with Darrick Grinder, NP. We discussed all aspects of the encounter. I agree with the assessment and plan as stated above.   He developed diffuse epistaxis followed by hematemesis and hemoptysis. Moved back to ICU.  Suspect major source of bleeding is his nose. INR > 4. Improved after nasal packing. Agree with FFP. KUB reviewed personally and shows ileus/early obstruction. Belly benign on exam. Unable to place NG tube with nasal packing bilaterally. Will keep NPO. No narcotics. Limit transfusions as possible due to potential transplant candidate.   Agree with continuing to hold diuretics. Restart amio. Follow H/H.   Co-ox 55%. Continue milrinone. VAD  parameters stable.   The patient is critically ill with multiple organ systems failure and requires high complexity decision making for assessment and support, frequent evaluation and titration of therapies, application of advanced monitoring technologies and extensive interpretation of multiple databases.   Critical Care Time devoted to patient care services described in this note is 35 Minutes.  Barth Trella,MD 4:13 PM

## 2014-12-27 NOTE — Progress Notes (Addendum)
Pt began coughing up a moderate amount of blood with clots in it.  While pt sat up his nose started to bleed.  Pt stated he felt somewhat dizzy/shaky, and complaining of right sided pain.  Pt HR also increasing - sustaining 120-135.  Dr. Prescott Gum notified of pt happenings.  150mg  amiodarone bolus ordered/given.  Also ordered to pack both nares with vasoline gauze/4x4.  RN will continue to monitor pt and notify MD if nose continues to bleed.  Claudette Stapler, RN

## 2014-12-27 NOTE — Progress Notes (Signed)
Patient ID: [name], male   DOB: [birth year], 44 y.o.   MRN: [id number] HeartMate 2 Rounding Note  Subjective:    Transferred to 2W yesterday afternoon and developed marked bilateral epistaxis last pm and early this am requiring bilateral nasal packing by Dr. Darcey Nora. His INR was 4.23 yesterday.  He was transferred back to the ICU and given 1 unit FFP with partial correction of INR to 2.41. He has coughed up some clots today probably from the back of his throat. He had 2 BM's probably due to the cathartic effect of the blood. His abdomen was distended this am but with good bowel sounds and no tenderness. KUB consistent with ileus.   He remains on Milrinone 0.125 with Co-ox this am of 55.   LVAD INTERROGATION:  HeartMate II LVAD:  Flow 4.5 liters/min, speed 9200, power 5, PI 8.2.    Objective:    Vital Signs:   Temp:  [97.4 F (36.3 C)-98.3 F (36.8 C)] 97.4 F (36.3 C) (09/08 1652) Resp:  [13-20] 18 (09/08 1800) SpO2:  [96 %-99 %] 98 % (09/08 1800) Last BM Date: 12/26/14 Mean arterial Pressure 88  Intake/Output:   Intake/Output Summary (Last 24 hours) at 12/27/14 1829 Last data filed at 12/27/14 1800  Gross per 24 hour  Intake 10429.25 ml  Output    702 ml  Net 9727.25 ml     Physical Exam: General:  Looks weak.  No resp difficulty HEENT: packing in both nares Neck: supple. JVP . Carotids 2+ bilat; no bruits. No lymphadenopathy or thryomegaly appreciated. Cor: normal heart sounds with LVAD hum present. Lungs: clear Abdomen: soft, nontender, distended. No hepatosplenomegaly. No bruits or masses. Good bowel sounds. Extremities: no cyanosis, clubbing, rash, edema Neuro: alert & orientedx3, cranial nerves grossly intact. moves all 4 extremities w/o difficulty. Affect pleasant  Telemetry: atrial fibrillation 100  Labs: Basic Metabolic Panel:  Recent Labs Lab 12/23/14 0351  12/24/14 0334 12/24/14 1608 12/25/14 0500 12/26/14 0517 12/27/14 0047 12/27/14 1557   NA 130*  < > 130* 130* 132* 134* 135 137  K 3.9  < > 3.3* 3.6 3.7 3.4* 4.1 3.6  CL 95*  < > 90* 89* 90* 91* 95* 96*  CO2 25  --  29  --  30 31 29   --   GLUCOSE 117*  < > 120* 119* 106* 115* 110* 130*  BUN 28*  < > 34* 38* 41* 51* 46* 38*  CREATININE 1.87*  < > 2.03* 2.00* 2.18* 2.08* 1.61* 1.50*  CALCIUM 9.0  --  9.0  --  8.9 8.9 8.9  --   MG 2.0  --  2.1  --  2.1 2.2 2.5*  --   PHOS 5.5*  --  5.6*  --  5.5* 3.9 2.0*  --   < > = values in this interval not displayed.  Liver Function Tests:  Recent Labs Lab 12/23/14 0351 12/24/14 0334 12/25/14 0500 12/26/14 0517 12/27/14 0047  AST 42* 31 26 23 31   ALT 20 17 16* 16* 19  ALKPHOS 65 62 66 86 115  BILITOT 2.6* 2.3* 1.8* 1.7* 1.4*  PROT 6.7 6.4* 6.3* 6.7 6.9  ALBUMIN 2.7* 2.6* 2.5* 2.6* 2.6*   No results for input(s): LIPASE, AMYLASE in the last 168 hours. No results for input(s): AMMONIA in the last 168 hours.  CBC:  Recent Labs Lab 12/23/14 0351  12/24/14 0334  12/25/14 0500 12/26/14 0517 12/27/14 0047 12/27/14 0615 12/27/14 0750 12/27/14 1557  WBC 16.6*  --  14.3*  --  10.2 9.5 11.5* 12.0* 12.5*  --   NEUTROABS 13.5*  --  11.6*  --  8.0* 6.6 8.0*  --   --   --   HGB 8.8*  < > 8.7*  < > 7.9* 8.3* 9.0* 8.4* 8.4* 9.9*  HCT 27.2*  < > 26.1*  < > 23.7* 25.5* 27.5* 26.4* 26.0* 29.0*  MCV 64.3*  --  62.9*  --  63.0* 62.7* 64.6* 63.8* 65.0*  --   PLT 242  --  264  --  274 365 435* 526* 496*  --   < > = values in this interval not displayed.  INR:  Recent Labs Lab 12/24/14 0334 12/25/14 0500 12/26/14 0517 12/27/14 0047 12/27/14 1200  INR 1.47 2.52* 4.45* 4.23* 2.41*   LDH 303  Other results:  EKG:   Imaging: Dg Chest Port 1 View  12/27/2014   CLINICAL DATA:  Chest pain.  EXAM: PORTABLE CHEST - 1 VIEW  COMPARISON:  12/26/2014  FINDINGS: Left ventricular assist device, left atrial appendage clamp, cardiac pacemaker, right-sided central venous catheter are stable.  Cardiomediastinal silhouette is enlarged.  Mediastinal contours appear intact.  There is no evidence of pneumothorax. The left hemidiaphragm is obscured, which may represent left lower lobe airspace consolidation versus atelectasis/left pleural effusion.  Osseous structures are without acute abnormality. Soft tissues are grossly normal.  IMPRESSION: Stably enlarged cardiac silhouette.  Left pleural effusion with associated atelectasis versus left lower lobe airspace consolidation, stable.  Stable supporting lines and tubes.   Electronically Signed   By: Fidela Salisbury M.D.   On: 12/27/2014 07:13   Dg Chest Port 1 View  12/26/2014   CLINICAL DATA:  Left ventricular assist device placement.  EXAM: PORTABLE CHEST - 1 VIEW  COMPARISON:  December 25, 2014  FINDINGS: Right-sided central catheter tip is in the superior vena cava, stable. Right jugular catheter has been removed. Pacemaker lead tip is in the region the right ventricle. There is a left ventricular assist device. There is a left atrial appendage clamp. No pneumothorax. There is atelectasis in the left lower lobe. Right lung is clear. Stable cardiomegaly. Pulmonary vascularity within normal limits. No adenopathy.  IMPRESSION: Left lower lobe atelectatic change, stable. Stable cardiomegaly. No pneumothorax. Note that right jugular catheter has been removed compared to 1 day prior.   Electronically Signed   By: Lowella Grip III M.D.   On: 12/26/2014 07:21   Dg Abd Portable 1v  12/27/2014   CLINICAL DATA:  Abdominal distention, nausea and vomiting.  EXAM: PORTABLE ABDOMEN - 1 VIEW  COMPARISON:  12/24/2014  FINDINGS: Left ventricular assist device is partially visualized. Postcholecystectomy clips also seen.  There is diffuse gaseous distention of small bowel loops with maximum transverse diameter of 3.1 cm. There is preserved colonic bowel gas pattern. Urinary bladder is distended. There is no evidence of organomegaly radiographically.  IMPRESSION: Gas is distention of small bowel loops  throughout the abdomen, with preserved colonic bowel gas pattern.  The findings are suggestive of early/incomplete small bowel obstruction or ileus.   Electronically Signed   By: Fidela Salisbury M.D.   On: 12/27/2014 08:16      Medications:     Scheduled Medications: . sodium chloride   Intravenous Once  . allopurinol  100 mg Oral Daily  . antiseptic oral rinse  7 mL Mouth Rinse QID  . aspirin EC  325 mg Oral Daily   Or  . aspirin  324 mg Per Tube  Daily   Or  . aspirin  300 mg Rectal Daily  . bisacodyl  10 mg Oral Daily   Or  . bisacodyl  10 mg Rectal Daily  . citalopram  20 mg Oral Daily  . digoxin  0.125 mg Intravenous Daily  . feeding supplement (ENSURE ENLIVE)  237 mL Oral TID BM  . metoCLOPramide (REGLAN) injection  10 mg Intravenous 4 times per day  . pantoprazole  40 mg Oral Daily  . piperacillin-tazobactam (ZOSYN)  IV  3.375 g Intravenous Q8H  . sodium chloride  10-40 mL Intracatheter Q12H  . sodium chloride  3 mL Intravenous Q12H  . Warfarin - Pharmacist Dosing Inpatient   Does not apply q1800     Infusions: . sodium chloride Stopped (12/22/14 0600)  . sodium chloride 10 mL/hr at 12/27/14 1800  . sodium chloride 250 mL (12/27/14 1800)  . amiodarone 30 mg/hr (12/27/14 1800)  . lactated ringers Stopped (12/20/14 2000)  . lactated ringers Stopped (12/22/14 0600)  . milrinone 0.125 mcg/kg/min (12/27/14 1800)     PRN Medications:  sodium chloride, hydrALAZINE, morphine injection, ondansetron (ZOFRAN) IV, oxyCODONE, sodium chloride, sodium chloride, traMADol   Assessment:  1. Acute/chonic systolic HF , NICM EF 0000000 with Cardiogenic shock on milrinone and levophed, IABP preoop, s/p HMII implant 12/20/2014 2. Chronic atrial fib 3. Postop atrial flutter with RVR 4. Expected EBL anemia 5. Fever early postop, resolved 6. Acute post op non-oliguric renal failure 7. Epistaxis with supra-therapeutic INR.   Plan/Discussion:    He remains hemodynamically  stable on Milrinone 0.125 with good pump parameters and stable Co-ox of 55%. Will leave him here for now with this acute nose bleed.   Supra-therapeutic INR with acute significant epistaxis. The bleeding appears to be under control with nasal packing and INR partially corrected to the therapeutic range with one unit FFP. Remove nasal packing in the am. Pharmacy is managing coumadin now.   Hgb 9.9 by I-stat this afternoon but I suspect it is lower than that.   Renal function improved with creat down to 1.5.   Probably ileus or maybe just swallowed air from the acute nose bleed overnight. He has good bowel sounds and 2 BM's today. Keeping NPO for now and will check KUB in the am.  IV amio for a-fib.   Continuing vanc and Zosyn for possible pneumonia.     I reviewed the LVAD parameters from today, and compared the results to the patient's prior recorded data.  No programming changes were made.  The LVAD is functioning within specified parameters.  The patient performs LVAD self-test daily.  LVAD interrogation was negative for any significant power changes, alarms or PI events/speed drops.  LVAD equipment check completed and is in good working order.  Back-up equipment present.   LVAD education done on emergency procedures and precautions and reviewed exit site care.  Length of Stay: [address]  Fernande Boyden [facility] [place] 12/27/2014, 6:29 PM

## 2014-12-27 NOTE — Progress Notes (Signed)
Dr. VanTrigt at bedside 

## 2014-12-27 NOTE — Progress Notes (Addendum)
Pt began heaving while spitting up blood/spit for 1-2 minutes.  Pt states that he feels like it is coming from his throat and continues to have right side pain.  Pt nose began to bleed again.  Dr Prescott Gum paged/notified.  Ordered zofran, CBC, and to repack nares with vasoline gauze.  Instructed to order rhino pack for MD to come and insert.

## 2014-12-27 NOTE — Progress Notes (Signed)
ANTIBIOTIC CONSULT NOTE - FOLLOW UP  Pharmacy Consult for vancomycin Indication: pneumonia   Pharmacy Consult for warfarin Indication: afib/LVAD   No Known Allergies  Patient Measurements: Height: 5\' 7"  (170.2 cm) Weight: 169 lb 5 oz (76.8 kg) IBW/kg (Calculated) : 66.1   Vital Signs: Temp: 97.9 F (36.6 C) (09/07 2232) Temp Source: Oral (09/07 2232) Intake/Output from previous day: 09/07 0701 - 09/08 0700 In: 1241.8 [P.O.:477; I.V.:414.8; IV Piggyback:350] Out: 550 [Urine:550] Intake/Output from this shift: Total I/O In: 20 [I.V.:20] Out: -   Labs:  Recent Labs  12/25/14 0500 12/26/14 0517 12/27/14 0047 12/27/14 0615  WBC 10.2 9.5 11.5* 12.0*  HGB 7.9* 8.3* 9.0* 8.4*  PLT 274 365 435* PENDING  CREATININE 2.18* 2.08* 1.61*  --    Estimated Creatinine Clearance: 53.6 mL/min (by C-G formula based on Cr of 1.61).  Recent Labs  12/24/14 2042 12/25/14 2110  VANCOTROUGH 6*  --   VANCORANDOM  --  20     Microbiology: Recent Results (from the past 720 hour(s))  Urine culture     Status: None   Collection Time: 11/30/14  3:32 PM  Result Value Ref Range Status   Specimen Description URINE, CLEAN CATCH  Final   Special Requests NONE  Final   Culture NO GROWTH 1 DAY  Final   Report Status 12/01/2014 FINAL  Final  MRSA PCR Screening     Status: None   Collection Time: 11/30/14  6:46 PM  Result Value Ref Range Status   MRSA by PCR NEGATIVE NEGATIVE Final    Comment:        The GeneXpert MRSA Assay (FDA approved for NASAL specimens only), is one component of a comprehensive MRSA colonization surveillance program. It is not intended to diagnose MRSA infection nor to guide or monitor treatment for MRSA infections.   MRSA PCR Screening     Status: None   Collection Time: 12/14/14  3:20 PM  Result Value Ref Range Status   MRSA by PCR NEGATIVE NEGATIVE Final    Comment:        The GeneXpert MRSA Assay (FDA approved for NASAL specimens only), is one  component of a comprehensive MRSA colonization surveillance program. It is not intended to diagnose MRSA infection nor to guide or monitor treatment for MRSA infections.   Surgical pcr screen     Status: Abnormal   Collection Time: 12/15/14  4:34 PM  Result Value Ref Range Status   MRSA, PCR NEGATIVE NEGATIVE Final   Staphylococcus aureus POSITIVE (A) NEGATIVE Final    Comment:        The Xpert SA Assay (FDA approved for NASAL specimens in patients over 78 years of age), is one component of a comprehensive surveillance program.  Test performance has been validated by [facility] for patients greater than or equal to 45 year old. It is not intended to diagnose infection nor to guide or monitor treatment.   Surgical pcr screen     Status: Abnormal   Collection Time: 12/17/14 11:26 AM  Result Value Ref Range Status   MRSA, PCR NEGATIVE NEGATIVE Final   Staphylococcus aureus POSITIVE (A) NEGATIVE Final    Comment:        The Xpert SA Assay (FDA approved for NASAL specimens in patients over 65 years of age), is one component of a comprehensive surveillance program.  Test performance has been validated by [facility] for patients greater than or equal to 18 year old. It is not intended  to diagnose infection nor to guide or monitor treatment.    Assessment: 44 yo male with HCAP is currently on vancomycin and zosyn.  Vancomycin trough was 39 on 9/5 at .~ 2100 and dose was held.  A vancomycin random drawn 9/6 at ~ 2100 is 20 mcg/ml.    Scr improved this am 1.6,uop down 0.16ml/kg/hr and +700 ml overnight. 1 x dose of vanc given 9/7 will repeat this afternoon to complete 7d course. Currently on day #7/7. Will continue zosyn through 9/10 to complete 7d course.  Anticoagulation: Eliquis PTA for afib & h/o DVT; heparin restarted low dose post VAD for [facility] now off with bump in INR  Warfarin MD>>Rx 9/7, INR 1.47> 2.52>4.45>4.2 after 4 doses 5/7.5/7.5/2.5>>held dose 9/7 will hold  again tonight Nose bleed/vomiting blood noted overnight and this am H/h low stable - add feSo4  Goal of Therapy:  INR goal 2-3 Vancomycin trough level 15-20 mcg/ml  Plan:  -vancomycin 750 mg today 12/27/14 -No change in zosyn - continue through 9/10 -Hold warfarin tonight  Erin Hearing PharmD., BCPS Clinical Pharmacist Pager [phone] 12/27/2014 8:23 AM

## 2014-12-27 NOTE — Progress Notes (Signed)
  Echocardiogram 2D Echocardiogram has been performed.  [name] 12/27/2014, 3:03 PM

## 2014-12-28 ENCOUNTER — Inpatient Hospital Stay (HOSPITAL_COMMUNITY): Payer: 59

## 2014-12-28 ENCOUNTER — Encounter (HOSPITAL_COMMUNITY): Payer: 59

## 2014-12-28 DIAGNOSIS — Z95811 Presence of heart assist device: Secondary | ICD-10-CM

## 2014-12-28 DIAGNOSIS — I5023 Acute on chronic systolic (congestive) heart failure: Secondary | ICD-10-CM

## 2014-12-28 LAB — COMPREHENSIVE METABOLIC PANEL
ALT: 21 U/L (ref 17–63)
AST: 31 U/L (ref 15–41)
Albumin: 2.3 g/dL — ABNORMAL LOW (ref 3.5–5.0)
Alkaline Phosphatase: 116 U/L (ref 38–126)
Anion gap: 9 (ref 5–15)
BILIRUBIN TOTAL: 1.3 mg/dL — AB (ref 0.3–1.2)
BUN: 30 mg/dL — AB (ref 6–20)
CO2: 31 mmol/L (ref 22–32)
Calcium: 8.6 mg/dL — ABNORMAL LOW (ref 8.9–10.3)
Chloride: 100 mmol/L — ABNORMAL LOW (ref 101–111)
Creatinine, Ser: 1.33 mg/dL — ABNORMAL HIGH (ref 0.61–1.24)
GFR calc Af Amer: >60 mL/min (ref >60)
GFR calc non Af Amer: >60 mL/min (ref >60)
GLUCOSE: 126 mg/dL — AB (ref 65–99)
POTASSIUM: 3.4 mmol/L — AB (ref 3.5–5.1)
Sodium: 140 mmol/L (ref 135–145)
TOTAL PROTEIN: 6.5 g/dL (ref 6.5–8.1)

## 2014-12-28 LAB — PROTIME-INR
INR: 2.73 — ABNORMAL HIGH (ref 0.00–1.49)
PROTHROMBIN TIME: 28.5 s — AB (ref 11.6–15.2)

## 2014-12-28 LAB — LACTATE DEHYDROGENASE: LDH: 256 U/L — AB (ref 98–192)

## 2014-12-28 LAB — GLUCOSE, CAPILLARY: Glucose-Capillary: 103 mg/dL — ABNORMAL HIGH (ref 65–99)

## 2014-12-28 LAB — CARBOXYHEMOGLOBIN
CARBOXYHEMOGLOBIN: 1.3 % (ref 0.5–1.5)
Carboxyhemoglobin: 1.8 % — ABNORMAL HIGH (ref 0.5–1.5)
Methemoglobin: 0.8 % (ref 0.0–1.5)
Methemoglobin: 0.6 % (ref 0.0–1.5)
O2 SAT: 49 %
O2 Saturation: 47.3 %
Total hemoglobin: 14.7 g/dL (ref 13.5–18.0)
Total hemoglobin: 8 g/dL — ABNORMAL LOW (ref 13.5–18.0)

## 2014-12-28 LAB — PREPARE FRESH FROZEN PLASMA: UNIT DIVISION: 0

## 2014-12-28 LAB — CBC
HCT: 24.6 % — ABNORMAL LOW (ref 39.0–52.0)
Hemoglobin: 7.8 g/dL — ABNORMAL LOW (ref 13.0–17.0)
MCH: 20.9 pg — ABNORMAL LOW (ref 26.0–34.0)
MCHC: 31.7 g/dL (ref 30.0–36.0)
MCV: 66 fL — ABNORMAL LOW (ref 78.0–100.0)
Platelets: 523 10*3/uL — ABNORMAL HIGH (ref 150–400)
RBC: 3.73 MIL/uL — ABNORMAL LOW (ref 4.22–5.81)
RDW: 17.3 % — ABNORMAL HIGH (ref 11.5–15.5)
WBC: 12.6 10*3/uL — ABNORMAL HIGH (ref 4.0–10.5)

## 2014-12-28 MED ORDER — LIDOCAINE HCL 2 % EX GEL
1.0000 "application " | Freq: Once | CUTANEOUS | Status: DC | PRN
  Filled 2014-12-28: qty 5

## 2014-12-28 MED ORDER — OXYMETAZOLINE HCL 0.05 % NA SOLN: 1.0000 | Freq: Once | NASAL | Status: DC | PRN

## 2014-12-28 MED ORDER — SALINE SPRAY 0.65 % NA SOLN
4.0000 | NASAL | Status: DC | PRN
  Filled 2014-12-28: qty 44

## 2014-12-28 MED ORDER — POTASSIUM CHLORIDE 10 MEQ/50ML IV SOLN
10.0000 meq | INTRAVENOUS | Status: AC
  Administered 2014-12-28 (×3): 10 meq via INTRAVENOUS
  Filled 2014-12-28 (×3): qty 50

## 2014-12-28 MED ORDER — SODIUM CHLORIDE 0.9 % IV SOLN: 250.0000 mL | INTRAVENOUS | Status: DC

## 2014-12-28 MED ORDER — TRIPLE ANTIBIOTIC 3.5-400-5000 EX OINT
1.0000 "application " | TOPICAL_OINTMENT | Freq: Once | CUTANEOUS | Status: DC | PRN
  Filled 2014-12-28: qty 1

## 2014-12-28 MED ORDER — SILVER NITRATE-POT NITRATE 75-25 % EX MISC
1.0000 | Freq: Once | CUTANEOUS | Status: DC | PRN
  Filled 2014-12-28: qty 1

## 2014-12-28 MED ORDER — LIDOCAINE HCL 4 % EX SOLN
0.0000 mL | Freq: Once | CUTANEOUS | Status: DC | PRN
  Filled 2014-12-28: qty 50

## 2014-12-28 MED ORDER — OXYMETAZOLINE HCL 0.05 % NA SOLN
1.0000 | Freq: Two times a day (BID) | NASAL | Status: DC | PRN
  Filled 2014-12-28: qty 15

## 2014-12-28 MED ORDER — LIDOCAINE-EPINEPHRINE (PF) 1 %-1:200000 IJ SOLN
0.0000 mL | Freq: Once | INTRAMUSCULAR | Status: DC | PRN
  Filled 2014-12-28: qty 30

## 2014-12-28 NOTE — Progress Notes (Signed)
[name] is exhausted today. He has had nose bleed and coughing up clots yesterday. He is NPO. Feels very weak and tired. Provided encouragement and support. He says pain is managed on current regimen.   Vinie Sill, NP Palliative Medicine Team Pager # [phone] (M-F 8a-5p) Team Phone # [phone] (Nights/Weekends)

## 2014-12-28 NOTE — Progress Notes (Signed)
Occupational Therapy Treatment Patient Details Name: [name] MRN: [id number] DOB: October 25, 1970 Today's Date: 12/28/2014    History of present illness Patient is a 44 yo male admitted 12/14/14 with heart failure.  Patient s/p LVAD on 12/20/14.  PMH:  NICM, EF 20-25%, CHF, ICD, Afib, HTN, severe MR/TR, RA   OT comments  Pt with increased fatigue today. States he is frustrated with the nose bleeds. Agreeable to chair level exercise. Transferring with Min A. Discussed feelings of wanting to do "better". Reassurred pt that he is making good progress considering the extent of his surgery. Will continue to follow acutely.  Follow Up Recommendations  Home health OT    Equipment Recommendations  3 in 1 bedside comode    Recommendations for Other Services      Precautions / Restrictions Precautions Precautions: Sternal;Fall Precaution Comments: LVAD;  Restrictions Other Position/Activity Restrictions: Sternal precautions       Mobility Bed Mobility Overal bed mobility: Needs Assistance           Sit to sidelying: Mod assist;HOB elevated (assist to lift BLE back onto bed)    Transfers Overall transfer level: Needs assistance   Transfers: Sit to/from Stand;Stand Pivot Transfers Sit to Stand: Min assist Stand pivot transfers: Min assist       General transfer comment: pt holding heart pillow    Balance Overall balance assessment: Needs assistance   Sitting balance-Leahy Scale: Good       Standing balance-Leahy Scale: Fair                     ADL Overall ADL's : Needs assistance/impaired                                     Functional mobility during ADLs: Minimal assistance (std pivot only) General ADL Comments: Max A UB/LB B; required vc to attend/realize importance of knowing location of drive line and control box prior to transferring      Vision                     Perception     Praxis      Cognition   Behavior  During Therapy: [facility] for tasks assessed/performed Overall Cognitive Status: Within Functional Limits for tasks assessed                       Extremity/Trunk Assessment               Exercises General Exercises - Upper Extremity Shoulder Flexion: AAROM;Both;15 reps Shoulder ABduction: AAROM;Both;15 reps;Seated Elbow Flexion: AROM;Both;20 reps Elbow Extension: AROM;Both;20 reps Other Exercises Other Exercises: grip strengthening ex - squeeze ball   Shoulder Instructions       General Comments      Pertinent Vitals/ Pain       Pain Assessment: 0-10 Pain Score: 5  Pain Location: general discomfot Pain Descriptors / Indicators: Discomfort Pain Intervention(s): Limited activity within patient's tolerance  Home Living                                          Prior Functioning/Environment              Frequency Min 3X/week     Progress Toward Goals  OT Goals(current goals can  now be found in the care plan section)  Progress towards OT goals: Progressing toward goals  Acute Rehab OT Goals Patient Stated Goal: get home OT Goal Formulation: With patient Time For Goal Achievement: 01/05/15 Potential to Achieve Goals: Good ADL Goals Pt Will Perform Grooming: with set-up;with supervision;standing Pt Will Perform Upper Body Bathing: with set-up;with supervision;standing;sitting Pt Will Perform Lower Body Bathing: with set-up;with supervision;sit to/from stand Pt Will Perform Upper Body Dressing: with set-up;with supervision;sitting;standing Pt Will Perform Lower Body Dressing: with set-up;with supervision;sit to/from stand Pt Will Transfer to Toilet: with supervision;ambulating;bedside commode Pt Will Perform Toileting - Clothing Manipulation and hygiene: with supervision;sit to/from stand Additional ADL Goal #1: Pt will be able to change from battery source<>main power source independently Additional ADL Goal #2: Pt will be able to state  what 3 things he should have all times in the bag with him Additional ADL Goal #3: Pt will be Mod I and OOB for BADLs  Plan Discharge plan remains appropriate    Co-evaluation                 End of Session     Activity Tolerance Patient tolerated treatment well   Patient Left in bed;with call bell/phone within reach   Nurse Communication Mobility status        Time: [id number] OT Time Calculation (min): 25 min  Charges: OT General Charges $OT Visit: 1 Procedure OT Treatments $Therapeutic Activity: 23-37 mins  Ivor Kishi,HILLARY 12/28/2014, 5:16 PM   [facility], OTR/L  [phone] 12/28/2014

## 2014-12-28 NOTE — Progress Notes (Signed)
ANTIBIOTIC CONSULT NOTE - FOLLOW UP  Pharmacy Consult for vancomycin Indication: pneumonia   Pharmacy Consult for warfarin Indication: afib/LVAD  Assessment: 44 yo male with HCAP is currently on vancomycin and zosyn.  Vancomycin trough was 39 on 9/5 at ~ 2100 and dose was held.  A vancomycin random drawn 9/6 at ~ 2100 is 20 mcg/ml.    Scr improved this am 1.3, uop improved 0.75ml/kg/hr. Patient has now completed 7d course of vancomycin. Discussed with HF team and TCTS will not add any additional days to his therapy. Will continue zosyn through tomorrow morning 9/10 to complete that 7d course. No new fvers, wbc stable at 12.  ENT mentions that patient should be on abx while nose packing is in place (recommended for the next 5-7 days). Although prophylactic abx are contraversial most sources recommend TMP/SMX, cephalexin, or amoxicillin/clavulanic acid to prevent sinusitis and toxic shock syndrome. Would recommend cephalexin in this situation as bactrim can lead to erratic INRs.  Anticoagulation: Eliquis PTA for afib & h/o DVT Epistaxis resolved with rinorocket - transferred back to ICU for monitoring Patient received FFP 9/8 - > INR dropped to 2.4 but now up to 2.7, patient continues to be npo this am and likely vit k deficient. Given unknown INR trend will hold warfarin again today till INR drifts down.  Goal of Therapy:  INR goal 2-3 Vancomycin trough level 15-20 mcg/ml  Plan:  -Finished vancomycin -Finish zosyn 9/10 -Hold warfarin tonight -Follow up need for prophylactic abx while packing in place.  No Known Allergies  Patient Measurements: Height: 5\' 7"  (170.2 cm) Weight: 167 lb 8.8 oz (76 kg) IBW/kg (Calculated) : 66.1   Vital Signs: Temp: 98.4 F (36.9 C) (09/09 0830) Temp Source: Oral (09/09 0830) Intake/Output from previous day: 09/08 0701 - 09/09 0700 In: 1922.5 [I.V.:1428.5; Blood:244; IV Piggyback:250] Out: [id number]; Stool:3] Intake/Output from this  shift: Total I/O In: 72.9 [I.V.:22.9; IV Piggyback:50] Out: -   Labs:  Recent Labs  12/27/14 0047 12/27/14 0615 12/27/14 0750 12/27/14 1557 12/28/14 0409 12/28/14 0410  WBC 11.5* 12.0* 12.5*  --   --  12.6*  HGB 9.0* 8.4* 8.4* 9.9*  --  7.8*  PLT 435* 526* 496*  --   --  523*  CREATININE 1.61*  --   --  1.50* 1.33*  --    Estimated Creatinine Clearance: 64.9 mL/min (by C-G formula based on Cr of 1.33).  Recent Labs  12/25/14 2110  [facility] 20     Microbiology: Recent Results (from the past 720 hour(s))  Urine culture     Status: None   Collection Time: 11/30/14  3:32 PM  Result Value Ref Range Status   Specimen Description URINE, CLEAN CATCH  Final   Special Requests NONE  Final   Culture NO GROWTH 1 DAY  Final   Report Status 12/01/2014 FINAL  Final  MRSA PCR Screening     Status: None   Collection Time: 11/30/14  6:46 PM  Result Value Ref Range Status   MRSA by PCR NEGATIVE NEGATIVE Final    Comment:        The GeneXpert MRSA Assay (FDA approved for NASAL specimens only), is one component of a comprehensive MRSA colonization surveillance program. It is not intended to diagnose MRSA infection nor to guide or monitor treatment for MRSA infections.   MRSA PCR Screening     Status: None   Collection Time: 12/14/14  3:20 PM  Result Value Ref Range Status   MRSA  by PCR NEGATIVE NEGATIVE Final    Comment:        The GeneXpert MRSA Assay (FDA approved for NASAL specimens only), is one component of a comprehensive MRSA colonization surveillance program. It is not intended to diagnose MRSA infection nor to guide or monitor treatment for MRSA infections.   Surgical pcr screen     Status: Abnormal   Collection Time: 12/15/14  4:34 PM  Result Value Ref Range Status   MRSA, PCR NEGATIVE NEGATIVE Final   Staphylococcus aureus POSITIVE (A) NEGATIVE Final    Comment:        The Xpert SA Assay (FDA approved for NASAL specimens in patients over 21 years of  age), is one component of a comprehensive surveillance program.  Test performance has been validated by [facility] [place] for patients greater than or equal to 74 year old. It is not intended to diagnose infection nor to guide or monitor treatment.   Surgical pcr screen     Status: Abnormal   Collection Time: 12/17/14 11:26 AM  Result Value Ref Range Status   MRSA, PCR NEGATIVE NEGATIVE Final   Staphylococcus aureus POSITIVE (A) NEGATIVE Final    Comment:        The Xpert SA Assay (FDA approved for NASAL specimens in patients over 20 years of age), is one component of a comprehensive surveillance program.  Test performance has been validated by [facility] for patients greater than or equal to 60 year old. It is not intended to diagnose infection nor to guide or monitor treatment.     Erin Hearing PharmD., BCPS Clinical Pharmacist Pager [phone] 12/28/2014 2:06 PM

## 2014-12-28 NOTE — Progress Notes (Signed)
PT Cancellation Note  Patient Details Name: [name] MRN: [id number] DOB: 1970/10/30   Cancelled Treatment:    Reason Eval/Treat Not Completed: Fatigue limiting ability to participate. Pt reported he'd had a rough morning and wanted to further rest.  Pt had recurrent nose bleed this morning when attempting to walk. Spoke with RN and she agreed that pt should decide if he thought he could tolerate mobility. (And he asked to defer).    Eulis Salazar 12/28/2014, 1:31 PM  Pager [phone]

## 2014-12-28 NOTE — Consult Note (Signed)
ENT CONSULT:  Reason for Consult:Epistaxis Referring Physician: CVTS  [name] is an 44 y.o. male.  HPI: The patient underwent LVAD on 12/20/14 for acute heart failure. The patient is currently anticoagulated with Coumadin and aspirin. He developed acute epistaxis over the last several days with intermittent bleeding. Bilateral packing placed and removed. Right nasal packing in place, minimal bloody discharge.  Past Medical History  Diagnosis Date  . Chronic systolic heart failure     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%)  . Atrial fibrillation -parosysmal      Rx w amiodarone  . Noncompliance     H/O  MEDICAL NONCOMPLIANCE  . 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 Tricuspid valve regurgitation     SEVERE  . Severe mitral regurgitation   . Polymorphic ventricular tachycardia     RECURRENT WITH APPROPRIATE SHOCK THERAPY IN THE PAST  . Ventricular fibrillation     WITH APPROPRIATE SHOCK THERAPY IN THE PAST  . Hypertension   . Gout   . RA (rheumatoid arthritis)   . Automatic implantable cardiac defibrillator -BSX     single chamber  . GI bleed -massive     11 Units 2012  . Elevated LFTs   . H/O hyperthyroidism   . CHF (congestive heart failure)   . AKI (acute kidney injury)   . AICD (automatic cardioverter/defibrillator) present     Past Surgical History  Procedure Laterality Date  . Cardiac catheterization  06/2006    RIGHT HEART CATH SHOWING SEVERE BIVENTRICUALR CHF WITH MARKED FILLING AND PRESSURES  . Insert / replace / remove pacemaker      GUIDANT HE ICD MODEL 2180, SERIAL # [id number]  . Cholecystectomy    . Implantable cardioverter defibrillator generator change N/A 07/01/2011    Procedure: IMPLANTABLE CARDIOVERTER DEFIBRILLATOR GENERATOR CHANGE;  Surgeon: Deboraha Sprang, MD;  Location: [facility] CATH LAB;  Service: Cardiovascular;  Laterality: N/A;  . Tee without cardioversion N/A 12/12/2014    Procedure:  TRANSESOPHAGEAL ECHOCARDIOGRAM (TEE);  Surgeon: Jerline Pain, MD;  Location: [place];  Service: Cardiovascular;  Laterality: N/A;  . Cardiac catheterization N/A 12/14/2014    Procedure: Right Heart Cath;  Surgeon: Jolaine Artist, MD;  Location: [place] CV LAB;  Service: Cardiovascular;  Laterality: N/A;  . Cardiac catheterization N/A 12/14/2014    Procedure: IABP Insertion;  Surgeon: Jolaine Artist, MD;  Location: [place] CV LAB;  Service: Cardiovascular;  Laterality: N/A;  . Insertion of implantable left ventricular assist device N/A 12/20/2014    Procedure: INSERTION OF IMPLANTABLE LEFT VENTRICULAR ASSIST DEVICE;  Surgeon: Ivin Poot, MD;  Location: [place];  Service: Open Heart Surgery;  Laterality: N/A;  CIRC ARREST  NITRIC OXIDE  . Tee without cardioversion N/A 12/20/2014    Procedure: TRANSESOPHAGEAL ECHOCARDIOGRAM (TEE);  Surgeon: Ivin Poot, MD;  Location: [place];  Service: Open Heart Surgery;  Laterality: N/A;    Family History  Problem Relation Age of Onset  . Heart failure Brother     Social History:  reports that he has never smoked. He has never used smokeless tobacco. He reports that he does not drink alcohol or use illicit drugs.  Allergies: No Known Allergies  Medications: I have reviewed the patient's current medications.  Results for orders placed or performed during the hospital encounter of 12/14/14 (from the past 48 hour(s))  Brain natriuretic peptide     Status: Abnormal   Collection Time:  12/27/14 12:47 AM  Result Value Ref Range   B Natriuretic Peptide 781.0 (H) 0.0 - 100.0 pg/mL  CBC with Differential     Status: Abnormal   Collection Time: 12/27/14 12:47 AM  Result Value Ref Range   WBC 11.5 (H) 4.0 - 10.5 K/uL   RBC 4.26 4.22 - 5.81 MIL/uL   Hemoglobin 9.0 (L) 13.0 - 17.0 g/dL   HCT 27.5 (L) 39.0 - 52.0 %   MCV 64.6 (L) 78.0 - 100.0 fL   MCH 21.1 (L) 26.0 - 34.0 pg   MCHC 32.7 30.0 - 36.0 g/dL   RDW 17.2 (H) 11.5 - 15.5 %    Platelets 435 (H) 150 - 400 K/uL   Neutrophils Relative % 70 43 - 77 %   Neutro Abs 8.0 (H) 1.7 - 7.7 K/uL   Lymphocytes Relative 16 12 - 46 %   Lymphs Abs 1.8 0.7 - 4.0 K/uL   Monocytes Relative 14 (H) 3 - 12 %   Monocytes Absolute 1.6 (H) 0.1 - 1.0 K/uL   Eosinophils Relative 1 0 - 5 %   Eosinophils Absolute 0.1 0.0 - 0.7 K/uL   Basophils Relative 0 0 - 1 %   Basophils Absolute 0.0 0.0 - 0.1 K/uL  Magnesium     Status: Abnormal   Collection Time: 12/27/14 12:47 AM  Result Value Ref Range   Magnesium 2.5 (H) 1.7 - 2.4 mg/dL  Phosphorus     Status: Abnormal   Collection Time: 12/27/14 12:47 AM  Result Value Ref Range   Phosphorus 2.0 (L) 2.5 - 4.6 mg/dL  Lactate dehydrogenase     Status: Abnormal   Collection Time: 12/27/14 12:47 AM  Result Value Ref Range   LDH 303 (H) 98 - 192 U/L  Protime-INR     Status: Abnormal   Collection Time: 12/27/14 12:47 AM  Result Value Ref Range   Prothrombin Time 39.6 (H) 11.6 - 15.2 seconds   INR 4.23 (H) 0.00 - 1.49  Comprehensive metabolic panel     Status: Abnormal   Collection Time: 12/27/14 12:47 AM  Result Value Ref Range   Sodium 135 135 - 145 mmol/L   Potassium 4.1 3.5 - 5.1 mmol/L    Comment: DELTA CHECK NOTED   Chloride 95 (L) 101 - 111 mmol/L   CO2 29 22 - 32 mmol/L   Glucose, Bld 110 (H) 65 - 99 mg/dL   BUN 46 (H) 6 - 20 mg/dL   Creatinine, Ser 1.61 (H) 0.61 - 1.24 mg/dL   Calcium 8.9 8.9 - 10.3 mg/dL   Total Protein 6.9 6.5 - 8.1 g/dL   Albumin 2.6 (L) 3.5 - 5.0 g/dL   AST 31 15 - 41 U/L   ALT 19 17 - 63 U/L   Alkaline Phosphatase 115 38 - 126 U/L   Total Bilirubin 1.4 (H) 0.3 - 1.2 mg/dL   GFR calc non Af Amer 50 (L) >60 mL/min   GFR calc Af Amer 58 (L) >60 mL/min    Comment: (NOTE) The eGFR has been calculated using the CKD EPI equation. This calculation has not been validated in all clinical situations. eGFR's persistently <60 mL/min signify possible Chronic Kidney Disease.    Anion gap 11 5 - 15  APTT     Status:  Abnormal   Collection Time: 12/27/14 12:47 AM  Result Value Ref Range   aPTT 77 (H) 24 - 37 seconds    Comment:        IF BASELINE  aPTT IS ELEVATED, SUGGEST PATIENT RISK ASSESSMENT BE USED TO DETERMINE APPROPRIATE ANTICOAGULANT THERAPY.   CBC     Status: Abnormal   Collection Time: 12/27/14  6:15 AM  Result Value Ref Range   WBC 12.0 (H) 4.0 - 10.5 K/uL   RBC 4.14 (L) 4.22 - 5.81 MIL/uL   Hemoglobin 8.4 (L) 13.0 - 17.0 g/dL   HCT 26.4 (L) 39.0 - 52.0 %   MCV 63.8 (L) 78.0 - 100.0 fL   MCH 20.3 (L) 26.0 - 34.0 pg   MCHC 31.8 30.0 - 36.0 g/dL   RDW 17.4 (H) 11.5 - 15.5 %   Platelets 526 (H) 150 - 400 K/uL    Comment: PLATELET COUNT CONFIRMED BY SMEAR  Carboxyhemoglobin (Co-ox)     Status: Abnormal   Collection Time: 12/27/14  6:40 AM  Result Value Ref Range   Total hemoglobin 9.2 (L) 13.5 - 18.0 g/dL   O2 Saturation 55.5 %   Carboxyhemoglobin 1.4 0.5 - 1.5 %   Methemoglobin 0.7 0.0 - 1.5 %  CBC     Status: Abnormal   Collection Time: 12/27/14  7:50 AM  Result Value Ref Range   WBC 12.5 (H) 4.0 - 10.5 K/uL   RBC 4.00 (L) 4.22 - 5.81 MIL/uL   Hemoglobin 8.4 (L) 13.0 - 17.0 g/dL   HCT 26.0 (L) 39.0 - 52.0 %   MCV 65.0 (L) 78.0 - 100.0 fL   MCH 21.0 (L) 26.0 - 34.0 pg   MCHC 32.3 30.0 - 36.0 g/dL   RDW 17.2 (H) 11.5 - 15.5 %   Platelets 496 (H) 150 - 400 K/uL  Prepare fresh frozen plasma     Status: None   Collection Time: 12/27/14  7:53 AM  Result Value Ref Range   Unit Number [id number]    Blood Component Type THAWED PLASMA    Unit division 00    Status of Unit ISSUED,FINAL    Transfusion Status OK TO TRANSFUSE   Protime-INR     Status: Abnormal   Collection Time: 12/27/14 12:00 PM  Result Value Ref Range   Prothrombin Time 25.9 (H) 11.6 - 15.2 seconds   INR 2.41 (H) 0.00 - 1.49  Carboxyhemoglobin     Status: Abnormal   Collection Time: 12/27/14  3:55 PM  Result Value Ref Range   Total hemoglobin 7.8 (L) 13.5 - 18.0 g/dL   O2 Saturation 54.9 %    Carboxyhemoglobin 1.8 (H) 0.5 - 1.5 %   Methemoglobin 0.8 0.0 - 1.5 %  I-STAT, chem 8     Status: Abnormal   Collection Time: 12/27/14  3:57 PM  Result Value Ref Range   Sodium 137 135 - 145 mmol/L   Potassium 3.6 3.5 - 5.1 mmol/L   Chloride 96 (L) 101 - 111 mmol/L   BUN 38 (H) 6 - 20 mg/dL   Creatinine, Ser 1.50 (H) 0.61 - 1.24 mg/dL   Glucose, Bld 130 (H) 65 - 99 mg/dL   Calcium, Ion 1.16 1.12 - 1.23 mmol/L   TCO2 31 0 - 100 mmol/L   Hemoglobin 9.9 (L) 13.0 - 17.0 g/dL   HCT 29.0 (L) 39.0 - 52.0 %  Lactate dehydrogenase     Status: Abnormal   Collection Time: 12/28/14  4:09 AM  Result Value Ref Range   LDH 256 (H) 98 - 192 U/L  Protime-INR     Status: Abnormal   Collection Time: 12/28/14  4:09 AM  Result Value Ref Range   Prothrombin Time  28.5 (H) 11.6 - 15.2 seconds   INR 2.73 (H) 0.00 - 1.49  Comprehensive metabolic panel     Status: Abnormal   Collection Time: 12/28/14  4:09 AM  Result Value Ref Range   Sodium 140 135 - 145 mmol/L   Potassium 3.4 (L) 3.5 - 5.1 mmol/L   Chloride 100 (L) 101 - 111 mmol/L   CO2 31 22 - 32 mmol/L   Glucose, Bld 126 (H) 65 - 99 mg/dL   BUN 30 (H) 6 - 20 mg/dL   Creatinine, Ser 1.33 (H) 0.61 - 1.24 mg/dL   Calcium 8.6 (L) 8.9 - 10.3 mg/dL   Total Protein 6.5 6.5 - 8.1 g/dL   Albumin 2.3 (L) 3.5 - 5.0 g/dL   AST 31 15 - 41 U/L   ALT 21 17 - 63 U/L   Alkaline Phosphatase 116 38 - 126 U/L   Total Bilirubin 1.3 (H) 0.3 - 1.2 mg/dL   GFR calc non Af Amer >60 >60 mL/min   GFR calc Af Amer >60 >60 mL/min    Comment: (NOTE) The eGFR has been calculated using the CKD EPI equation. This calculation has not been validated in all clinical situations. eGFR's persistently <60 mL/min signify possible Chronic Kidney Disease.    Anion gap 9 5 - 15  CBC     Status: Abnormal   Collection Time: 12/28/14  4:10 AM  Result Value Ref Range   WBC 12.6 (H) 4.0 - 10.5 K/uL   RBC 3.73 (L) 4.22 - 5.81 MIL/uL   Hemoglobin 7.8 (L) 13.0 - 17.0 g/dL    Comment:  REPEATED TO VERIFY DELTA CHECK NOTED RESULT CALLED TO, READ BACK BY AND VERIFIED WITH: Mila Homer ,RN AT [phone] 12/28/14. K.PAXTON    HCT 24.6 (L) 39.0 - 52.0 %   MCV 66.0 (L) 78.0 - 100.0 fL   MCH 20.9 (L) 26.0 - 34.0 pg   MCHC 31.7 30.0 - 36.0 g/dL   RDW 17.3 (H) 11.5 - 15.5 %   Platelets 523 (H) 150 - 400 K/uL  Carboxyhemoglobin (Co-ox)     Status: None   Collection Time: 12/28/14  4:30 AM  Result Value Ref Range   Total hemoglobin 14.7 13.5 - 18.0 g/dL   O2 Saturation 47.3 %   Carboxyhemoglobin 1.3 0.5 - 1.5 %   Methemoglobin 0.8 0.0 - 1.5 %  Glucose, capillary     Status: Abnormal   Collection Time: 12/28/14 12:10 PM  Result Value Ref Range   Glucose-Capillary 103 (H) 65 - 99 mg/dL   Comment 1 Capillary Specimen    Comment 2 Notify RN     Dg Chest Port 1 View  12/28/2014   CLINICAL DATA:  Left ventricular assist device.  EXAM: PORTABLE CHEST - 1 VIEW  COMPARISON:  12/27/2014  FINDINGS: Left ventricular assist device unchanged in position. AICD unchanged in position. Left atrial appendage clip unchanged  Right arm PICC tip in the SVC  Cardiac enlargement. Negative for edema. Left lower lobe atelectasis and small left effusion unchanged.  IMPRESSION: Support devices unchanged. Left lower lobe atelectasis and small left effusion unchanged. No edema.   Electronically Signed   By: Franchot Gallo M.D.   On: 12/28/2014 07:51   Dg Chest Port 1 View  12/27/2014   CLINICAL DATA:  Chest pain.  EXAM: PORTABLE CHEST - 1 VIEW  COMPARISON:  12/26/2014  FINDINGS: Left ventricular assist device, left atrial appendage clamp, cardiac pacemaker, right-sided central venous catheter are stable.  Cardiomediastinal  silhouette is enlarged. Mediastinal contours appear intact.  There is no evidence of pneumothorax. The left hemidiaphragm is obscured, which may represent left lower lobe airspace consolidation versus atelectasis/left pleural effusion.  Osseous structures are without acute abnormality. Soft  tissues are grossly normal.  IMPRESSION: Stably enlarged cardiac silhouette.  Left pleural effusion with associated atelectasis versus left lower lobe airspace consolidation, stable.  Stable supporting lines and tubes.   Electronically Signed   By: Fidela Salisbury M.D.   On: 12/27/2014 07:13   Dg Abd Portable 1v  12/28/2014   CLINICAL DATA:  Left ventricular assist device.  Ileus.  EXAM: PORTABLE ABDOMEN - 1 VIEW  COMPARISON:  12/27/2014  FINDINGS: Left ventricular assist device unchanged in position.  Gas is seen in large and small bowel. Improvement in mild small bowel dilatation seen previously. Findings most consistent with improving ileus.  IMPRESSION: Improving ileus.   Electronically Signed   By: Franchot Gallo M.D.   On: 12/28/2014 07:53   Dg Abd Portable 1v  12/27/2014   CLINICAL DATA:  Abdominal distention, nausea and vomiting.  EXAM: PORTABLE ABDOMEN - 1 VIEW  COMPARISON:  12/24/2014  FINDINGS: Left ventricular assist device is partially visualized. Postcholecystectomy clips also seen.  There is diffuse gaseous distention of small bowel loops with maximum transverse diameter of 3.1 cm. There is preserved colonic bowel gas pattern. Urinary bladder is distended. There is no evidence of organomegaly radiographically.  IMPRESSION: Gas is distention of small bowel loops throughout the abdomen, with preserved colonic bowel gas pattern.  The findings are suggestive of early/incomplete small bowel obstruction or ileus.   Electronically Signed   By: Fidela Salisbury M.D.   On: 12/27/2014 08:16    ROS:ROS 12 systems reviewed and negative except as stated in HPI   Blood pressure 94/76, pulse 97, temperature 98.4 F (36.9 C), temperature source Oral, resp. rate 14, height _0  (1.702 m), weight 76 kg (167 lb 8.8 oz), SpO2 98 %.  PHYSICAL EXAM: General appearance - alert and in no distress Nose - right nasal packing in place, clotted blood in the left nostril without active bleeding. Mouth -  mucous membranes moist, pharynx normal without lesions and Minimal bloody discharge in the posterior oropharynx, no active bleeding. Neck - supple, no significant adenopathy  Studies Reviewed:None  Assessment/Plan: Patient with chronic heart failure, status post LVAD for acute changes. Patient on anticoagulation therapy with acute epistaxis. Diffuse bleeding from the nasal mucosa without evidence of mass, lesion or infection. Packing in right nasal passageway with some bloody discharge on left, no active bleeding. Recommend epistaxis precautions with frequent use of saline nasal spray avoid nasal trauma or nose blowing. Recommend leaving right nasal packing in place for 5-7 days and adjusting current anticoagulation therapy. Patient should be on antibiotics while packing in place. Expect some intermittent oozing, monitor for significant active bleeding. Please reconsult as needed if continued concerns.  [place], Shukri Nistler 12/28/2014, 12:33 PM

## 2014-12-28 NOTE — Progress Notes (Signed)
Patient ID: [name], male   DOB: [birth year], 44 y.o.   MRN: [id number]  SICU Evening Rounds  Hemodynamically stable today. Pump parameters the same with PI 8.2, flow 4.1.  Had some more nasal bleeding today after packing removed and another pack was put in right side. ENT saw patient and wants to leave the packing in for a week.  Passing some flatus. No BM today. Taking some ice chips.  Up in chair watching TV.

## 2014-12-28 NOTE — Progress Notes (Addendum)
'  Admitted 12/14/14 per Dr. Haroldine Laws due to acute/chonic systolic HF with cardiogenic shock.  HeartMate II LVAD implated on 12/20/14 by Dr. Cyndia Bent as destination therapy VAD.    Vital signs: Temp:  98.6  HR: 97 - 110 AFib Doppler MAP: 80 - 94 O2 Sat: 97 - 100% 3L [place] Wt in lbs:  169 > 187> ... > 169> 169> 169> 167  Intra-op blood products: 4FFP 1 plt 1100 cell saver  ICU Blood Products:  12/27/14: 1 FFP (nosebleeds with INR > 4)  Gtts: Milrinone 0.125  Amiodarone 30 mg/h   Nitric Oxide: Off 12/23/14  Anticoagulants: ASA 325mg  started 12/21/14 Coumadin started 12/22/14  INR Goal 2 - 3  LVAD interrogation reveals:  Speed:  9180 Flow:  3.9 Power: 4.8 PI: 8.5 Alarms:  None Events:  None since 12/27/14 AM where he had 4 that day, 8 day before  PI events all recorded have PI registering >5.0 and often in the 7 - 8 range Fixed speed:  9200 Low speed limit:  8600  Drive Line:   Gauze dressing intact with attachment device accurately applied; being changed daily.   Arrhythmia:  12/20/14: AFlutter/AFib with RVR requiring multiple boluses and continuous infusion Amiodarone.   Labs:   Co-ox:  54 > ...> 78.6> 54> 55> 54.9> 47.3% (on 0.125 Milrinone)   Hgb:  11.2 > 8.3> 8.7> 7.9 (Iron PO started)> 7.8> 8.4> 7.8  WBC:  8.5 > 9.5> 14.5> 19.1> ...>11.5> 12.0> 12.5> 12.6  LDH: 279> 220> 305 (peak)> 286> 254> 264> 256  INR: ...> 2.52> 4.45> 4.23 (FFP for Nosebleeds)> 2.41> 2.73  Creatinine: 1.20 - 1.4 (baseline pre-op)... > 2.03> 2.0> 2.18> 1.33  Transfers:  12/26/14-->PTCU 2W25 12/27/14-->transferred back to ICU 2S12 for nosebleeds/hemoptysis   Plan/Recommendation as D/W Team:  1. Dressing changes are being maintained by bedside nurses at this point. Two sisters Caren Griffins and Jeannene Patella are only available on weekends for now (they both work in [employer]). Will need to have bedside nursing support regarding teaching family sterile technique and DL dressing maintenance.   2. HR better on digoxin and PO  Amiodarone. Flows on VAD somewhat better but still < 5.0 with increased speed to 9200. PIs >8.   3. PO intake still marginal.   4. Nose bleeding started again 1 hour after rhino tamponade was removed when attempting to ambulate this morning. Pressure held (and is still being held) by patient/nursing. Slowing down a little after > 30 min of pressure. Dr. Prescott Gum ordered Rhino rocket to be replaced. D/W Dr. Cyndia Bent also to have ENT come see patient. Dr. Kerri Perches to consult.   5. D/W Dr. Prescott Gum regarding anticoagulation management--will hold ASA d/t bleeding and restart at 81 mg once epistaxis resolved. No coumadin tonight (increased overnight and still moderate bleeding/low HgB).

## 2014-12-28 NOTE — Progress Notes (Signed)
LVAD Coordinator Note:  Checked BP with auto cuff after reports that it was elevated >94 on doppler. With PI's > 8 and palpable pulse sites I would assume that we are dopplering is a systolic and not the MAP.   Doppler 104  Auto-BP Cuff: 104/77 (84)  Would recommend to use auto-BP cuff to routinely (unless clinically he changes where PI drops and he is not as pulsatile but doppler BP every few hours to correlate/validate measurements. Hopefully this will allow him to get some better sleep tonight also. Before treating with Hydralazine would check automatic BP cuff to ensure we are not over-treating BP.   Feels a little down and frustrated with post-op recovery progress so far. Celexa 20 mg QD started. Offered to talk with him about any feelings/frustrations he is having or if he would like to discuss with anyone else, of which he declined. Feels that this is something he needs to "work out on his own." Reminded him that this is a very major surgery and he was a sick man to begin with; didn't get sick over night and won't get better over night. Only minimally interacted with me and did not hold eye contact at all throughout the conversation.   Continue to offer support. Will call sister, Jeannene Patella to let her know about arrangements to do formal D/C teaching this weekend with expectation he may go home at the end of next week, barring no further set backs and he continues to progress.   [name], RN VAD Coordinator   Office: [phone] 24/7 VAD Pager: [phone]

## 2014-12-28 NOTE — Progress Notes (Signed)
Dr. Lucianne Lei trigt and [name], RN aware of doppler MAP, will try automatic cuff

## 2014-12-28 NOTE — Progress Notes (Signed)
Patient ID: [name], male   DOB: Sep 12, 1970, 44 y.o.   MRN: [id number] HeartMate 2 Rounding Note  Subjective:    No complaints this am  Hemodynamics stable overnight on Milrinone 0.125. Co-ox down to 47% but I suspect this is related to Hgb drop to 7.8 since pump parameters all look good.  No more BM's since 2 yesterday. Passing flatus. No further bleeding from nose. I removed the packing.     LVAD INTERROGATION:  HeartMate II LVAD:  Flow 4.3 liters/min, speed 9200, power 5, PI 8.4.  No PI events since yesterday am.  Objective:    Vital Signs:   Temp:  [97.4 F (36.3 C)-98.6 F (37 C)] 98.3 F (36.8 C) (09/09 0400) Resp:  [13-35] 15 (09/09 0700) SpO2:  [96 %-100 %] 98 % (09/09 0700) Weight:  [76 kg (167 lb 8.8 oz)-76.8 kg (169 lb 5 oz)] 76 kg (167 lb 8.8 oz) (09/09 0600) Last BM Date: 12/27/14 Mean arterial Pressure 86  Intake/Output:   Intake/Output Summary (Last 24 hours) at 12/28/14 0803 Last data filed at 12/28/14 0700  Gross per 24 hour  Intake 10643.45 ml  Output   1578 ml  Net 9065.45 ml     Physical Exam: General:  Looks tired and weak but better than yesterday. No resp difficulty HEENT: normal Neck: supple. JVP . Carotids 2+ bilat; no bruits. No lymphadenopathy or thryomegaly appreciated. Cor: normal heart sounds with LVAD hum present.  Incisions healing well Lungs: clear Abdomen: soft, nontender, mildly distended. No hepatosplenomegaly. No bruits or masses. Good bowel sounds. Extremities: no cyanosis, clubbing, rash, edema Neuro: alert & orientedx3, cranial nerves grossly intact. moves all 4 extremities w/o difficulty. Affect pleasant  Telemetry: atrial flutter 97  Labs: Basic Metabolic Panel:  Recent Labs Lab 12/23/14 0351  12/24/14 0334  12/25/14 0500 12/26/14 0517 12/27/14 0047 12/27/14 1557 12/28/14 0409  NA 130*  < > 130*  < > 132* 134* 135 137 140  K 3.9  < > 3.3*  < > 3.7 3.4* 4.1 3.6 3.4*  CL 95*  < > 90*  < > 90* 91* 95* 96*  100*  CO2 25  --  29  --  30 31 29   --  31  GLUCOSE 117*  < > 120*  < > 106* 115* 110* 130* 126*  BUN 28*  < > 34*  < > 41* 51* 46* 38* 30*  CREATININE 1.87*  < > 2.03*  < > 2.18* 2.08* 1.61* 1.50* 1.33*  CALCIUM 9.0  --  9.0  --  8.9 8.9 8.9  --  8.6*  MG 2.0  --  2.1  --  2.1 2.2 2.5*  --   --   PHOS 5.5*  --  5.6*  --  5.5* 3.9 2.0*  --   --   < > = values in this interval not displayed.  Liver Function Tests:  Recent Labs Lab 12/24/14 0334 12/25/14 0500 12/26/14 0517 12/27/14 0047 12/28/14 0409  AST 31 26 23 31 31   ALT 17 16* 16* 19 21  ALKPHOS 62 66 86 115 116  BILITOT 2.3* 1.8* 1.7* 1.4* 1.3*  PROT 6.4* 6.3* 6.7 6.9 6.5  ALBUMIN 2.6* 2.5* 2.6* 2.6* 2.3*   No results for input(s): LIPASE, AMYLASE in the last 168 hours. No results for input(s): AMMONIA in the last 168 hours.  CBC:  Recent Labs Lab 12/23/14 0351  12/24/14 0334  12/25/14 0500 12/26/14 [id number] 12/27/14 0047 12/27/14 0615 12/27/14 0750  12/27/14 1557 12/28/14 0410  WBC 16.6*  --  14.3*  --  10.2 9.5 11.5* 12.0* 12.5*  --  12.6*  NEUTROABS 13.5*  --  11.6*  --  8.0* 6.6 8.0*  --   --   --   --   HGB 8.8*  < > 8.7*  < > 7.9* 8.3* 9.0* 8.4* 8.4* 9.9* 7.8*  HCT 27.2*  < > 26.1*  < > 23.7* 25.5* 27.5* 26.4* 26.0* 29.0* 24.6*  MCV 64.3*  --  62.9*  --  63.0* 62.7* 64.6* 63.8* 65.0*  --  66.0*  PLT 242  --  264  --  274 365 435* 526* 496*  --  523*  < > = values in this interval not displayed.  INR:  Recent Labs Lab 12/25/14 0500 12/26/14 0517 12/27/14 0047 12/27/14 1200 12/28/14 0409  INR 2.52* 4.45* 4.23* 2.41* 2.73*   LDH 256 stable  Other results:  EKG:   Imaging: Dg Chest Port 1 View  12/28/2014   CLINICAL DATA:  Left ventricular assist device.  EXAM: PORTABLE CHEST - 1 VIEW  COMPARISON:  12/27/2014  FINDINGS: Left ventricular assist device unchanged in position. AICD unchanged in position. Left atrial appendage clip unchanged  Right arm PICC tip in the SVC  Cardiac enlargement. Negative  for edema. Left lower lobe atelectasis and small left effusion unchanged.  IMPRESSION: Support devices unchanged. Left lower lobe atelectasis and small left effusion unchanged. No edema.   Electronically Signed   By: Franchot Gallo M.D.   On: 12/28/2014 07:51   Dg Chest Port 1 View  12/27/2014   CLINICAL DATA:  Chest pain.  EXAM: PORTABLE CHEST - 1 VIEW  COMPARISON:  12/26/2014  FINDINGS: Left ventricular assist device, left atrial appendage clamp, cardiac pacemaker, right-sided central venous catheter are stable.  Cardiomediastinal silhouette is enlarged. Mediastinal contours appear intact.  There is no evidence of pneumothorax. The left hemidiaphragm is obscured, which may represent left lower lobe airspace consolidation versus atelectasis/left pleural effusion.  Osseous structures are without acute abnormality. Soft tissues are grossly normal.  IMPRESSION: Stably enlarged cardiac silhouette.  Left pleural effusion with associated atelectasis versus left lower lobe airspace consolidation, stable.  Stable supporting lines and tubes.   Electronically Signed   By: Fidela Salisbury M.D.   On: 12/27/2014 07:13   Dg Abd Portable 1v  12/28/2014   CLINICAL DATA:  Left ventricular assist device.  Ileus.  EXAM: PORTABLE ABDOMEN - 1 VIEW  COMPARISON:  12/27/2014  FINDINGS: Left ventricular assist device unchanged in position.  Gas is seen in large and small bowel. Improvement in mild small bowel dilatation seen previously. Findings most consistent with improving ileus.  IMPRESSION: Improving ileus.   Electronically Signed   By: Franchot Gallo M.D.   On: 12/28/2014 07:53   Dg Abd Portable 1v  12/27/2014   CLINICAL DATA:  Abdominal distention, nausea and vomiting.  EXAM: PORTABLE ABDOMEN - 1 VIEW  COMPARISON:  12/24/2014  FINDINGS: Left ventricular assist device is partially visualized. Postcholecystectomy clips also seen.  There is diffuse gaseous distention of small bowel loops with maximum transverse diameter of  3.1 cm. There is preserved colonic bowel gas pattern. Urinary bladder is distended. There is no evidence of organomegaly radiographically.  IMPRESSION: Gas is distention of small bowel loops throughout the abdomen, with preserved colonic bowel gas pattern.  The findings are suggestive of early/incomplete small bowel obstruction or ileus.   Electronically Signed   By: Linwood Dibbles.D.  On: 12/27/2014 08:16      Medications:     Scheduled Medications: . sodium chloride   Intravenous Once  . allopurinol  100 mg Oral Daily  . antiseptic oral rinse  7 mL Mouth Rinse QID  . aspirin EC  325 mg Oral Daily   Or  . aspirin  324 mg Per Tube Daily   Or  . aspirin  300 mg Rectal Daily  . bisacodyl  10 mg Oral Daily   Or  . bisacodyl  10 mg Rectal Daily  . citalopram  20 mg Oral Daily  . digoxin  0.125 mg Intravenous Daily  . feeding supplement (ENSURE ENLIVE)  237 mL Oral TID BM  . metoCLOPramide (REGLAN) injection  10 mg Intravenous 4 times per day  . pantoprazole  40 mg Oral Daily  . piperacillin-tazobactam (ZOSYN)  IV  3.375 g Intravenous Q8H  . potassium chloride  10 mEq Intravenous Q1 Hr x 3  . sodium chloride  10-40 mL Intracatheter Q12H  . sodium chloride  3 mL Intravenous Q12H  . Warfarin - Pharmacist Dosing Inpatient   Does not apply q1800     Infusions: . sodium chloride Stopped (12/22/14 0600)  . sodium chloride 10 mL/hr at 12/28/14 0400  . sodium chloride 250 mL (12/28/14 0400)  . amiodarone 30 mg/hr (12/28/14 [id number])  . lactated ringers Stopped (12/20/14 2000)  . lactated ringers Stopped (12/22/14 0600)  . milrinone 0.125 mcg/kg/min (12/28/14 0400)     PRN Medications:  sodium chloride, hydrALAZINE, morphine injection, ondansetron (ZOFRAN) IV, oxyCODONE, sodium chloride, sodium chloride, traMADol   Assessment:   1. Acute/chonic systolic HF , NICM EF 0000000 with Cardiogenic shock on milrinone and levophed, IABP preoop, s/p HMII implant 12/20/2014 2. Chronic  atrial fib 3. Postop atrial flutter with RVR 4. Expected EBL anemia 5. Fever early postop, resolved 6. Acute post op non-oliguric renal failure 7. Epistaxis with supra-therapeutic INR.  Plan/Discussion:    He remains hemodynamically stable on Milrinone 0.125 with good pump parameters and low Co-ox of 47% related to low Hgb. His creat is down to 1.3. Avoiding transfusion due to possible transplant candidate. His PI is 8.3 so he may tolerate increase in speed. Will discuss with heart failure team.  KUB still shows a lot of air in small bowel but he is passing flatus, has good bowel sounds and abdomen is benign. Will keep NPO except some ice chips today. Mobilize.   Afib/flutter with controlled rate on amio.   Continues on Zosyn and vanc for possible pneumonia to complete 7 day course but CXR looks good. Vanc should be finished now.  INR up from 2.4 to 2.73 with no coumadin last night. He is probably vit K deficient from not eating so INR may drift up again. Would hold coumadin for now.  I reviewed the LVAD parameters from today, and compared the results to the patient's prior recorded data.  No programming changes were made.  The LVAD is functioning within specified parameters.  The patient performs LVAD self-test daily.  LVAD interrogation was negative for any significant power changes, alarms or PI events/speed drops.  LVAD equipment check completed and is in good working order.  Back-up equipment present.   LVAD education done on emergency procedures and precautions and reviewed exit site care.  Length of Stay: [address]  Fernande Boyden [facility] 12/28/2014, 8:03 AM

## 2014-12-28 NOTE — Progress Notes (Signed)
Heart Failure team notified of MAP being 94 at 0800 and 100 at 1200.  Also made aware of Carboxyhemoglobin 49.  No new orders at this time.

## 2014-12-28 NOTE — Progress Notes (Signed)
VAD Team Rounding Note  Subjective:    s/p HMII implant 9/1  9/6 Milrinone cut back to 0.125 mcg.  VAD was increased to 9200.  9/7 Continued on milrinone 0.125 mcg. INR 4.4 Transferred to 2W 9/8 Developed epitaxis. Coughing up clots. INR 4.23 Given FFP.  CXR ok    Mild dyspnea . Complaining of N/V  Complaining of R clavicle pain.   Hgb 7.8 Co-ox 47%  Creatinine 2.1> 2.08>1.6 >1.3   LVAD INTERROGATION:  HeartMate II LVAD:  Flow 4.1 liters/min, speed 9190, power 4.8  ,  PI 8    No PI events. events   Objective:    Vital Signs:   Temp:  [97.4 F (36.3 C)-98.6 F (37 C)] 98.3 F (36.8 C) (09/09 0400) Resp:  [13-35] 14 (09/09 0800) SpO2:  [96 %-100 %] 98 % (09/09 0800) Weight:  [167 lb 8.8 oz (76 kg)-169 lb 5 oz (76.8 kg)] 167 lb 8.8 oz (76 kg) (09/09 0600) Last BM Date: 12/27/14 Mean arterial Pressure 88  Intake/Output:   Intake/Output Summary (Last 24 hours) at 12/28/14 0825 Last data filed at 12/28/14 0802  Gross per 24 hour  Intake 10716.35 ml  Output   1578 ml  Net 9138.35 ml     Physical Exam:  General:lying in bed. NAD  HEENT: normal  Neck: supple. JVP 6-7  Cor Sternal dressing in place.  Mechanical heart sounds with LVAD hum present. Lungs: Decreased in the bases.  Abdomen: soft, nontender, nondistended. hypoactive bowel sounds. Driveline: C/D/I; securement device intact and driveline incorporated Extremities: no cyanosis, clubbing, rash, R and LLE SCDs.  Neuro: awake conversant GU: Foley   Telemetry: AF 120-130s 10s  Labs: Basic Metabolic Panel:  Recent Labs Lab 12/23/14 0351  12/24/14 0334  12/25/14 0500 12/26/14 0517 12/27/14 0047 12/27/14 1557 12/28/14 0409  NA 130*  < > 130*  < > 132* 134* 135 137 140  K 3.9  < > 3.3*  < > 3.7 3.4* 4.1 3.6 3.4*  CL 95*  < > 90*  < > 90* 91* 95* 96* 100*  CO2 25  --  29  --  30 31 29   --  31  GLUCOSE 117*  < > 120*  < > 106* 115* 110* 130* 126*  BUN 28*  < > 34*  < > 41* 51* 46* 38* 30*    CREATININE 1.87*  < > 2.03*  < > 2.18* 2.08* 1.61* 1.50* 1.33*  CALCIUM 9.0  --  9.0  --  8.9 8.9 8.9  --  8.6*  MG 2.0  --  2.1  --  2.1 2.2 2.5*  --   --   PHOS 5.5*  --  5.6*  --  5.5* 3.9 2.0*  --   --   < > = values in this interval not displayed.  Liver Function Tests:  Recent Labs Lab 12/24/14 0334 12/25/14 0500 12/26/14 0517 12/27/14 0047 12/28/14 0409  AST 31 26 23 31 31   ALT 17 16* 16* 19 21  ALKPHOS 62 66 86 115 116  BILITOT 2.3* 1.8* 1.7* 1.4* 1.3*  PROT 6.4* 6.3* 6.7 6.9 6.5  ALBUMIN 2.6* 2.5* 2.6* 2.6* 2.3*   No results for input(s): LIPASE, AMYLASE in the last 168 hours. No results for input(s): AMMONIA in the last 168 hours.  CBC:  Recent Labs Lab 12/23/14 0351  12/24/14 0334  12/25/14 0500 12/26/14 [id number] 12/27/14 0047 12/27/14 0615 12/27/14 0750  12/27/14 1557 12/28/14 0410  WBC 16.6*  --  14.3*  --  10.2 9.5 11.5* 12.0* 12.5*  --  12.6*  NEUTROABS 13.5*  --  11.6*  --  8.0* 6.6 8.0*  --   --   --   --   HGB 8.8*  < > 8.7*  < > 7.9* 8.3* 9.0* 8.4* 8.4* 9.9* 7.8*  HCT 27.2*  < > 26.1*  < > 23.7* 25.5* 27.5* 26.4* 26.0* 29.0* 24.6*  MCV 64.3*  --  62.9*  --  63.0* 62.7* 64.6* 63.8* 65.0*  --  66.0*  PLT 242  --  264  --  274 365 435* 526* 496*  --  523*  < > = values in this interval not displayed.  INR:  Recent Labs Lab 12/25/14 0500 12/26/14 0517 12/27/14 0047 12/27/14 1200 12/28/14 0409  INR 2.52* 4.45* 4.23* 2.41* 2.73*    Other results:   Imaging: Dg Chest Port 1 View  12/28/2014   CLINICAL DATA:  Left ventricular assist device.  EXAM: PORTABLE CHEST - 1 VIEW  COMPARISON:  12/27/2014  FINDINGS: Left ventricular assist device unchanged in position. AICD unchanged in position. Left atrial appendage clip unchanged  Right arm PICC tip in the SVC  Cardiac enlargement. Negative for edema. Left lower lobe atelectasis and small left effusion unchanged.  IMPRESSION: Support devices unchanged. Left lower lobe atelectasis and small left effusion  unchanged. No edema.   Electronically Signed   By: Franchot Gallo M.D.   On: 12/28/2014 07:51   Dg Chest Port 1 View  12/27/2014   CLINICAL DATA:  Chest pain.  EXAM: PORTABLE CHEST - 1 VIEW  COMPARISON:  12/26/2014  FINDINGS: Left ventricular assist device, left atrial appendage clamp, cardiac pacemaker, right-sided central venous catheter are stable.  Cardiomediastinal silhouette is enlarged. Mediastinal contours appear intact.  There is no evidence of pneumothorax. The left hemidiaphragm is obscured, which may represent left lower lobe airspace consolidation versus atelectasis/left pleural effusion.  Osseous structures are without acute abnormality. Soft tissues are grossly normal.  IMPRESSION: Stably enlarged cardiac silhouette.  Left pleural effusion with associated atelectasis versus left lower lobe airspace consolidation, stable.  Stable supporting lines and tubes.   Electronically Signed   By: Fidela Salisbury M.D.   On: 12/27/2014 07:13   Dg Abd Portable 1v  12/28/2014   CLINICAL DATA:  Left ventricular assist device.  Ileus.  EXAM: PORTABLE ABDOMEN - 1 VIEW  COMPARISON:  12/27/2014  FINDINGS: Left ventricular assist device unchanged in position.  Gas is seen in large and small bowel. Improvement in mild small bowel dilatation seen previously. Findings most consistent with improving ileus.  IMPRESSION: Improving ileus.   Electronically Signed   By: Franchot Gallo M.D.   On: 12/28/2014 07:53   Dg Abd Portable 1v  12/27/2014   CLINICAL DATA:  Abdominal distention, nausea and vomiting.  EXAM: PORTABLE ABDOMEN - 1 VIEW  COMPARISON:  12/24/2014  FINDINGS: Left ventricular assist device is partially visualized. Postcholecystectomy clips also seen.  There is diffuse gaseous distention of small bowel loops with maximum transverse diameter of 3.1 cm. There is preserved colonic bowel gas pattern. Urinary bladder is distended. There is no evidence of organomegaly radiographically.  IMPRESSION: Gas is  distention of small bowel loops throughout the abdomen, with preserved colonic bowel gas pattern.  The findings are suggestive of early/incomplete small bowel obstruction or ileus.   Electronically Signed   By: Fidela Salisbury M.D.   On: 12/27/2014 08:16  Medications:     Scheduled Medications: . sodium chloride   Intravenous Once  . allopurinol  100 mg Oral Daily  . antiseptic oral rinse  7 mL Mouth Rinse QID  . aspirin EC  325 mg Oral Daily   Or  . aspirin  324 mg Per Tube Daily   Or  . aspirin  300 mg Rectal Daily  . bisacodyl  10 mg Oral Daily   Or  . bisacodyl  10 mg Rectal Daily  . citalopram  20 mg Oral Daily  . digoxin  0.125 mg Intravenous Daily  . feeding supplement (ENSURE ENLIVE)  237 mL Oral TID BM  . metoCLOPramide (REGLAN) injection  10 mg Intravenous 4 times per day  . pantoprazole  40 mg Oral Daily  . piperacillin-tazobactam (ZOSYN)  IV  3.375 g Intravenous Q8H  . potassium chloride  10 mEq Intravenous Q1 Hr x 3  . sodium chloride  10-40 mL Intracatheter Q12H  . sodium chloride  3 mL Intravenous Q12H  . Warfarin - Pharmacist Dosing Inpatient   Does not apply q1800    Infusions: . sodium chloride Stopped (12/22/14 0600)  . sodium chloride 10 mL/hr at 12/28/14 0400  . sodium chloride 250 mL (12/28/14 0400)  . amiodarone 30 mg/hr (12/28/14 0800)  . lactated ringers Stopped (12/20/14 2000)  . lactated ringers Stopped (12/22/14 0600)  . milrinone 0.125 mcg/kg/min (12/28/14 0400)    PRN Medications: sodium chloride, hydrALAZINE, morphine injection, ondansetron (ZOFRAN) IV, oxyCODONE, sodium chloride, sodium chloride, traMADol   Assessment:   1. Cardiogenic shock s/p HMII implant 12/20/2014 2. Acute/chonic systolic HF   -- NICM EF 0000000 3. Chronic AF with RVR 4. Expected EBL anemia 5. Acute on chronic renal failure 6. PNA  7. Anemia  8. Epistaxis 9. Hemoptysis 10 Nausea  Plan/Discussion:   S/P HMII LVAD implant on 9/1   Epistaxis  resolved.today. INR was 4.23 .Received FFP.  INR today 2.7. No coumadin today per Dr Cyndia Bent. Hgb 7.8. No transfusion for now given as he is a potential transplant candidate (Transfuse hgb < 7).    Renal function coming down.  Hold off on diuretic with bleeding issues this morning.   Todays CO-OX down 47%. Continue milrinone 0.125 mcg. Repeat CO-OX may need to increase milrinone.   Continue speed at 9200 with septum bowing to Left on ECHO 9/8   AF rate 100s. Continue IV amio 30 mg per hour and transition to po once ileus resolved. Poor response to BB on 9/6.  Completed IV Vanc7/7 Vanc. Day 6/7 Zosyn. Continue Vanc and Zosyn for probable PNA. WBC up a little 11.5>12.6     Had BM 9/7 . On Reglan now.   INR 4.45>4.23. Hold coumadin today.  Pharmacy following.   I reviewed the LVAD parameters from today, and compared the results to the patient's prior recorded data.  No programming changes were made.  The LVAD is functioning within specified parameters.  The patient performs LVAD self-test daily.  LVAD interrogation was negative for any significant power changes, alarms or PI events/speed drops.  LVAD equipment check completed and is in good working order.  Back-up equipment present.   LVAD education done on emergency procedures and precautions and reviewed exit site care.  Length of Stay: 14  CLEGG,AMY NP-C 12/28/2014, 8:25 AM  VAD Team --- VAD ISSUES ONLY--- Pager [phone] (7am - 7am)  Advanced Heart Failure Team  Pager [phone] (M-F; 7a - 4p)   Patient seen with NP, agree with  the above note.    No further nose bleeding.  Hemoglobin lower but hold transfusion for > 7 given transplant candidacy.  INR in therapeutic range today.   Now with bowel sounds, hopefully resolving ileus.   HR controlled on amiodarone.   Creatinine better.   Co-ox lower today at 47%.  He remains on milrinone 0.125.  Continue this dose for now but will need to repeat co-ox later this morning (?increase).   Yesterday's echo showed the IV septum bowing some towards the left, but he has had no PI events or ventricular ectopy.  I would not increase the LVAD speed at this point.   35 minutes critical care time.   Loralie Champagne 12/28/2014 8:44 AM

## 2014-12-29 LAB — BASIC METABOLIC PANEL
Anion gap: 10 (ref 5–15)
Anion gap: 9 (ref 5–15)
BUN: 23 mg/dL — AB (ref 6–20)
BUN: 22 mg/dL — ABNORMAL HIGH (ref 6–20)
CALCIUM: 8.1 mg/dL — AB (ref 8.9–10.3)
CALCIUM: 8.5 mg/dL — AB (ref 8.9–10.3)
CHLORIDE: 103 mmol/L (ref 101–111)
CO2: 27 mmol/L (ref 22–32)
CO2: 26 mmol/L (ref 22–32)
CREATININE: 1.25 mg/dL — AB (ref 0.61–1.24)
Chloride: 104 mmol/L (ref 101–111)
Creatinine, Ser: 1.17 mg/dL (ref 0.61–1.24)
GFR calc Af Amer: >60 mL/min (ref >60)
Glucose, Bld: 153 mg/dL — ABNORMAL HIGH (ref 65–99)
Glucose, Bld: 90 mg/dL (ref 65–99)
POTASSIUM: 3.2 mmol/L — AB (ref 3.5–5.1)
Potassium: 4.2 mmol/L (ref 3.5–5.1)
SODIUM: 141 mmol/L (ref 135–145)
SODIUM: 138 mmol/L (ref 135–145)

## 2014-12-29 LAB — LACTATE DEHYDROGENASE: LDH: 269 U/L — ABNORMAL HIGH (ref 98–192)

## 2014-12-29 LAB — CARBOXYHEMOGLOBIN
CARBOXYHEMOGLOBIN: 1.4 % (ref 0.5–1.5)
METHEMOGLOBIN: 0.9 % (ref 0.0–1.5)
O2 Saturation: 55.7 %
TOTAL HEMOGLOBIN: 9.3 g/dL — AB (ref 13.5–18.0)

## 2014-12-29 LAB — CBC
HEMATOCRIT: 23.1 % — AB (ref 39.0–52.0)
Hemoglobin: 7.2 g/dL — ABNORMAL LOW (ref 13.0–17.0)
MCH: 20.7 pg — ABNORMAL LOW (ref 26.0–34.0)
MCHC: 31.2 g/dL (ref 30.0–36.0)
MCV: 66.4 fL — ABNORMAL LOW (ref 78.0–100.0)
PLATELETS: 600 10*3/uL — AB (ref 150–400)
RBC: 3.48 MIL/uL — ABNORMAL LOW (ref 4.22–5.81)
RDW: 18 % — AB (ref 11.5–15.5)
WBC: 10.3 10*3/uL (ref 4.0–10.5)

## 2014-12-29 LAB — PROTIME-INR
INR: 3.64 — ABNORMAL HIGH (ref 0.00–1.49)
Prothrombin Time: 35.4 seconds — ABNORMAL HIGH (ref 11.6–15.2)

## 2014-12-29 MED ORDER — PIPERACILLIN-TAZOBACTAM 3.375 G IVPB
3.3750 g | Freq: Three times a day (TID) | INTRAVENOUS | Status: DC
  Administered 2014-12-29 – 2015-01-01 (×9): 3.375 g via INTRAVENOUS
  Filled 2014-12-29 (×10): qty 50

## 2014-12-29 MED ORDER — POTASSIUM CHLORIDE 10 MEQ/50ML IV SOLN
10.0000 meq | INTRAVENOUS | Status: AC
  Administered 2014-12-29 (×5): 10 meq via INTRAVENOUS
  Filled 2014-12-29 (×5): qty 50

## 2014-12-29 NOTE — Progress Notes (Signed)
ANTICOAGULATION CONSULT NOTE - Follow Up Consult  Pharmacy Consult for Coumadin Indication: afib/LVAD  No Known Allergies  Patient Measurements: Height: 5\' 7"  (170.2 cm) Weight: 166 lb 0.1 oz (75.3 kg) IBW/kg (Calculated) : 66.1  Vital Signs: Temp: 98.3 F (36.8 C) (09/10 0700) Temp Source: Oral (09/10 0700) BP: 114/100 mmHg (09/10 1000)  Labs:  Recent Labs  12/27/14 0047  12/27/14 0750 12/27/14 1200 12/27/14 1557 12/28/14 0409 12/28/14 0410 12/29/14 0350 12/29/14 0800  HGB 9.0*  < > 8.4*  --  9.9*  --  7.8*  --  7.2*  HCT 27.5*  < > 26.0*  --  29.0*  --  24.6*  --  23.1*  PLT 435*  < > 496*  --   --   --  523*  --  600*  APTT 77*  --   --   --   --   --   --   --   --   LABPROT 39.6*  --   --  25.9*  --  28.5*  --  35.4*  --   INR 4.23*  --   --  2.41*  --  2.73*  --  3.64*  --   CREATININE 1.61*  --   --   --  1.50* 1.33*  --   --  1.17  < > = values in this interval not displayed.  Estimated Creatinine Clearance: 73.8 mL/min (by C-G formula based on Cr of 1.17).  Assessment: 44yom on eliquis pta for afib/hx DVT, now transitioned to coumadin post LVAD. He received 4 doses and INR trended up to 4.45. He also developed epistaxis. Coumadin held and he received FFP on 9/8. INR initially trended down to 2.4 but is now trending back up to 3.64 today. He remains NPO for ileus which is likely making him vitamin k deficient. He is also on an amiodarone drip. Only mild residual oozing noted and his nose remains packed. Hgb 7.2.  Goal of Therapy:  INR 2-3 Monitor platelets by anticoagulation protocol: Yes   Plan:  1) No coumadin today 2) Follow daily INR  Deboraha Sprang 12/29/2014,11:09 AM

## 2014-12-29 NOTE — Progress Notes (Signed)
Patient ID: [name], male   DOB: 05-19-1970, 44 y.o.   MRN: [id number]           VAD Team Rounding Note  Subjective:    s/p HMII implant 9/1  9/6 Milrinone cut back to 0.125 mcg.  VAD was increased to 9200.  9/7 Continued on milrinone 0.125 mcg. INR 4.4 Transferred to 2W 9/8 Developed epitaxis. Coughing up clots. INR 4.23 Given FFP.  CXR ok   More epistaxis yesterday, seen by ENT and right nostril packed.  Mild oozing overnight but better overall.  INR 3.64 today, coumadin will need to be adjusted.   No complaints today.  More interactive.  Had a dose of hydralazine overnight but currently MAP 70s-90.  Co-ox up to 56% today, remains on milrinone 0.125 and amiodarone for rate control.  No BMET or CBC yet today.   Remains NPO, flatus with no BM yet.   LVAD INTERROGATION:  HeartMate II LVAD:  Flow 3.9 liters/min, speed 9200, power 4.8,  PI 8.3.  2 PI events/24 hrs.   Objective:    Vital Signs:   Temp:  [97.6 F (36.4 C)-98.4 F (36.9 C)] 97.7 F (36.5 C) (09/10 0400) Resp:  [11-23] 16 (09/10 0700) BP: (90-113)/(68-89) 90/77 mmHg (09/10 0700) SpO2:  [95 %-100 %] 99 % (09/10 0700) Weight:  [166 lb 0.1 oz (75.3 kg)] 166 lb 0.1 oz (75.3 kg) (09/10 0600) Last BM Date: 12/27/14 Mean arterial Pressure 88  Intake/Output:   Intake/Output Summary (Last 24 hours) at 12/29/14 0733 Last data filed at 12/29/14 0700  Gross per 24 hour  Intake 2425.4 ml  Output   1051 ml  Net 1374.4 ml     Physical Exam:  General: Sitting in chair. NAD  HEENT: Right nasal packing.  Neck: supple. JVP 6-7  Cor Sternal dressing in place.  Mechanical heart sounds with LVAD hum present. Lungs: Decreased in the bases.  Abdomen: soft, nontender, nondistended. hypoactive bowel sounds. Driveline: C/D/I; securement device intact and driveline incorporated Extremities: no cyanosis, clubbing, rash, R and LLE SCDs.  Neuro: awake conversant GU: Foley   Telemetry: AF 90s-100s  Labs: Basic Metabolic  Panel:  Recent Labs Lab 12/23/14 0351  12/24/14 0334  12/25/14 0500 12/26/14 0517 12/27/14 0047 12/27/14 1557 12/28/14 0409  NA 130*  < > 130*  < > 132* 134* 135 137 140  K 3.9  < > 3.3*  < > 3.7 3.4* 4.1 3.6 3.4*  CL 95*  < > 90*  < > 90* 91* 95* 96* 100*  CO2 25  --  29  --  30 31 29   --  31  GLUCOSE 117*  < > 120*  < > 106* 115* 110* 130* 126*  BUN 28*  < > 34*  < > 41* 51* 46* 38* 30*  CREATININE 1.87*  < > 2.03*  < > 2.18* 2.08* 1.61* 1.50* 1.33*  CALCIUM 9.0  --  9.0  --  8.9 8.9 8.9  --  8.6*  MG 2.0  --  2.1  --  2.1 2.2 2.5*  --   --   PHOS 5.5*  --  5.6*  --  5.5* 3.9 2.0*  --   --   < > = values in this interval not displayed.  Liver Function Tests:  Recent Labs Lab 12/24/14 0334 12/25/14 0500 12/26/14 0517 12/27/14 0047 12/28/14 0409  AST 31 26 23 31 31   ALT 17 16* 16* 19 21  ALKPHOS 62 66 86  115 116  BILITOT 2.3* 1.8* 1.7* 1.4* 1.3*  PROT 6.4* 6.3* 6.7 6.9 6.5  ALBUMIN 2.6* 2.5* 2.6* 2.6* 2.3*   No results for input(s): LIPASE, AMYLASE in the last 168 hours. No results for input(s): AMMONIA in the last 168 hours.  CBC:  Recent Labs Lab 12/23/14 0351  12/24/14 0334  12/25/14 0500 12/26/14 0517 12/27/14 0047 12/27/14 0615 12/27/14 0750 12/27/14 1557 12/28/14 0410  WBC 16.6*  --  14.3*  --  10.2 9.5 11.5* 12.0* 12.5*  --  12.6*  NEUTROABS 13.5*  --  11.6*  --  8.0* 6.6 8.0*  --   --   --   --   HGB 8.8*  < > 8.7*  < > 7.9* 8.3* 9.0* 8.4* 8.4* 9.9* 7.8*  HCT 27.2*  < > 26.1*  < > 23.7* 25.5* 27.5* 26.4* 26.0* 29.0* 24.6*  MCV 64.3*  --  62.9*  --  63.0* 62.7* 64.6* 63.8* 65.0*  --  66.0*  PLT 242  --  264  --  274 365 435* 526* 496*  --  523*  < > = values in this interval not displayed.  INR:  Recent Labs Lab 12/26/14 0517 12/27/14 0047 12/27/14 1200 12/28/14 0409 12/29/14 0350  INR 4.45* 4.23* 2.41* 2.73* 3.64*    Other results:   Imaging: Dg Chest Port 1 View  12/28/2014   CLINICAL DATA:  Left ventricular assist device.  EXAM:  PORTABLE CHEST - 1 VIEW  COMPARISON:  12/27/2014  FINDINGS: Left ventricular assist device unchanged in position. AICD unchanged in position. Left atrial appendage clip unchanged  Right arm PICC tip in the SVC  Cardiac enlargement. Negative for edema. Left lower lobe atelectasis and small left effusion unchanged.  IMPRESSION: Support devices unchanged. Left lower lobe atelectasis and small left effusion unchanged. No edema.   Electronically Signed   By: Franchot Gallo M.D.   On: 12/28/2014 07:51   Dg Abd Portable 1v  12/28/2014   CLINICAL DATA:  Left ventricular assist device.  Ileus.  EXAM: PORTABLE ABDOMEN - 1 VIEW  COMPARISON:  12/27/2014  FINDINGS: Left ventricular assist device unchanged in position.  Gas is seen in large and small bowel. Improvement in mild small bowel dilatation seen previously. Findings most consistent with improving ileus.  IMPRESSION: Improving ileus.   Electronically Signed   By: Franchot Gallo M.D.   On: 12/28/2014 07:53   Dg Abd Portable 1v  12/27/2014   CLINICAL DATA:  Abdominal distention, nausea and vomiting.  EXAM: PORTABLE ABDOMEN - 1 VIEW  COMPARISON:  12/24/2014  FINDINGS: Left ventricular assist device is partially visualized. Postcholecystectomy clips also seen.  There is diffuse gaseous distention of small bowel loops with maximum transverse diameter of 3.1 cm. There is preserved colonic bowel gas pattern. Urinary bladder is distended. There is no evidence of organomegaly radiographically.  IMPRESSION: Gas is distention of small bowel loops throughout the abdomen, with preserved colonic bowel gas pattern.  The findings are suggestive of early/incomplete small bowel obstruction or ileus.   Electronically Signed   By: Fidela Salisbury M.D.   On: 12/27/2014 08:16     Medications:     Scheduled Medications: . allopurinol  100 mg Oral Daily  . antiseptic oral rinse  7 mL Mouth Rinse QID  . bisacodyl  10 mg Oral Daily   Or  . bisacodyl  10 mg Rectal Daily  .  citalopram  20 mg Oral Daily  . digoxin  0.125 mg Intravenous Daily  .  feeding supplement (ENSURE ENLIVE)  237 mL Oral TID BM  . metoCLOPramide (REGLAN) injection  10 mg Intravenous 4 times per day  . pantoprazole  40 mg Oral Daily  . sodium chloride  10-40 mL Intracatheter Q12H  . sodium chloride  3 mL Intravenous Q12H  . Warfarin - Pharmacist Dosing Inpatient   Does not apply q1800    Infusions: . sodium chloride Stopped (12/22/14 0600)  . sodium chloride Stopped (12/28/14 0900)  . sodium chloride 250 mL (12/29/14 0700)  . amiodarone 30 mg/hr (12/29/14 0700)  . lactated ringers Stopped (12/20/14 2000)  . lactated ringers Stopped (12/22/14 0600)  . milrinone 0.125 mcg/kg/min (12/29/14 0700)    PRN Medications: sodium chloride, hydrALAZINE, lidocaine, lidocaine, lidocaine-EPINEPHrine, morphine injection, ondansetron (ZOFRAN) IV, oxyCODONE, oxymetazoline, silver nitrate applicators, sodium chloride, sodium chloride, sodium chloride, traMADol, TRIPLE ANTIBIOTIC   Assessment:   1. Cardiogenic shock s/p HMII implant 12/20/2014 2. Acute/chonic systolic HF   -- NICM EF 0000000 3. Chronic AF with RVR 4. Expected EBL anemia 5. Acute on chronic renal failure 6. PNA  7. Anemia  8. Epistaxis: right nasal packing.  9. Hemoptysis 10. Ileus  Plan/Discussion:   S/P HMII LVAD implant on 9/1   Epistaxis yesterday, right nostril now packed.  ENT recommends leaving packing in for 5-7 days.  Mild residual oozing.  Needs to remain on antibiotics while packing in place, would leave him on Zosyn while NPO, switch to po when he can take po again.     Pending BMET.  Not on diuretic currently.   Co-ox 56% today which is better.  Continue milrinone 0.125.  9/8 echo showed the IV septum bowing some towards the left, but he has had few PI events.  I would not increase the LVAD speed at this point.  Think a formal ramp echo on Monday would be helpful.   MAP stable this morning though had to get  hydralazine last night.  PI has been stably on the high side.   AF rate 90s-100s. Continue IV amio 30 mg per hour and transition to po once ileus resolved. Poor response to BB on 9/6.  Ileus: Flatus but no BM yet.    INR 4.45>4.23>3.64. Hold coumadin today.  Pharmacy following.   I reviewed the LVAD parameters from today, and compared the results to the patient's prior recorded data.  No programming changes were made.  The LVAD is functioning within specified parameters.  The patient performs LVAD self-test daily.  LVAD interrogation was negative for any significant power changes, alarms or PI events/speed drops.  LVAD equipment check completed and is in good working order.  Back-up equipment present.   LVAD education done on emergency procedures and precautions and reviewed exit site care.  Length of Stay: 15  35 minutes critical care time.   Loralie Champagne 12/29/2014 7:33 AM

## 2014-12-29 NOTE — Progress Notes (Signed)
Patient ID: [name], male   DOB: 17-Jun-1970, 44 y.o.   MRN: [id number]  HeartMate 2 Rounding Note  Subjective:    Feels ok overall but nasal packing is the main complaint.  He is taking some ice chips. Says he can't swallow very well with the nasal packing.  Passing flatus and some liquid stool this am.  Ambulated around the ICU. Switched from console to battery and back on his own.   Remains on milrinone 0.125 with Co-ox up to 56%.  Few drops of blood from left nares  LVAD INTERROGATION:  HeartMate II LVAD:  Flow 4.0 liters/min, speed 9200, power 5, PI 8.2.    Objective:    Vital Signs:   Temp:  [97.6 F (36.4 C)-98.4 F (36.9 C)] 98.3 F (36.8 C) (09/10 0700) Resp:  [11-23] 15 (09/10 1000) BP: (90-114)/(68-100) 114/100 mmHg (09/10 1000) SpO2:  [95 %-100 %] 97 % (09/10 1000) Weight:  [75.3 kg (166 lb 0.1 oz)] 75.3 kg (166 lb 0.1 oz) (09/10 0600) Last BM Date: 12/27/14 Mean arterial Pressure 104 this am and received some hydralazine.  Intake/Output:   Intake/Output Summary (Last 24 hours) at 12/29/14 1106 Last data filed at 12/29/14 1000  Gross per 24 hour  Intake   2292 ml  Output   1051 ml  Net   1241 ml     Physical Exam: General:  Looks brighter. No resp difficulty HEENT: normal Neck: supple. JVP . Carotids 2+ bilat; no bruits. No lymphadenopathy or thryomegaly appreciated. Cor: Mechanical heart sounds with LVAD hum present. Lungs: clear Abdomen: soft, nontender, less distended. No hepatosplenomegaly. No bruits or masses. Good bowel sounds. Extremities: no cyanosis, clubbing, rash, edema Neuro: alert & orientedx3, cranial nerves grossly intact. moves all 4 extremities w/o difficulty. Affect pleasant  Telemetry: a-fib   Labs: Basic Metabolic Panel:  Recent Labs Lab 12/23/14 0351  12/24/14 0334  12/25/14 0500 12/26/14 0517 12/27/14 0047 12/27/14 1557 12/28/14 0409 12/29/14 0800  NA 130*  < > 130*  < > 132* 134* 135 137 140 141  K 3.9  < >  3.3*  < > 3.7 3.4* 4.1 3.6 3.4* 3.2*  CL 95*  < > 90*  < > 90* 91* 95* 96* 100* 104  CO2 25  --  29  --  30 31 29   --  31 27  GLUCOSE 117*  < > 120*  < > 106* 115* 110* 130* 126* 153*  BUN 28*  < > 34*  < > 41* 51* 46* 38* 30* 23*  CREATININE 1.87*  < > 2.03*  < > 2.18* 2.08* 1.61* 1.50* 1.33* 1.17  CALCIUM 9.0  --  9.0  --  8.9 8.9 8.9  --  8.6* 8.1*  MG 2.0  --  2.1  --  2.1 2.2 2.5*  --   --   --   PHOS 5.5*  --  5.6*  --  5.5* 3.9 2.0*  --   --   --   < > = values in this interval not displayed.  Liver Function Tests:  Recent Labs Lab 12/24/14 0334 12/25/14 0500 12/26/14 0517 12/27/14 0047 12/28/14 0409  AST 31 26 23 31 31   ALT 17 16* 16* 19 21  ALKPHOS 62 66 86 115 116  BILITOT 2.3* 1.8* 1.7* 1.4* 1.3*  PROT 6.4* 6.3* 6.7 6.9 6.5  ALBUMIN 2.6* 2.5* 2.6* 2.6* 2.3*   No results for input(s): LIPASE, AMYLASE in the last 168 hours. No results for  input(s): AMMONIA in the last 168 hours.  CBC:  Recent Labs Lab 12/23/14 0351  12/24/14 0334  12/25/14 0500 12/26/14 0517 12/27/14 0047 12/27/14 0615 12/27/14 0750 12/27/14 1557 12/28/14 0410 12/29/14 0800  WBC 16.6*  --  14.3*  --  10.2 9.5 11.5* 12.0* 12.5*  --  12.6* 10.3  NEUTROABS 13.5*  --  11.6*  --  8.0* 6.6 8.0*  --   --   --   --   --   HGB 8.8*  < > 8.7*  < > 7.9* 8.3* 9.0* 8.4* 8.4* 9.9* 7.8* 7.2*  HCT 27.2*  < > 26.1*  < > 23.7* 25.5* 27.5* 26.4* 26.0* 29.0* 24.6* 23.1*  MCV 64.3*  --  62.9*  --  63.0* 62.7* 64.6* 63.8* 65.0*  --  66.0* 66.4*  PLT 242  --  264  --  274 365 435* 526* 496*  --  523* 600*  < > = values in this interval not displayed.  INR:  Recent Labs Lab 12/26/14 0517 12/27/14 0047 12/27/14 1200 12/28/14 0409 12/29/14 0350  INR 4.45* 4.23* 2.41* 2.73* 3.64*   LDH 269 stable  Other results:  EKG:   Imaging: Dg Chest Port 1 View  12/28/2014   CLINICAL DATA:  Left ventricular assist device.  EXAM: PORTABLE CHEST - 1 VIEW  COMPARISON:  12/27/2014  FINDINGS: Left ventricular  assist device unchanged in position. AICD unchanged in position. Left atrial appendage clip unchanged  Right arm PICC tip in the SVC  Cardiac enlargement. Negative for edema. Left lower lobe atelectasis and small left effusion unchanged.  IMPRESSION: Support devices unchanged. Left lower lobe atelectasis and small left effusion unchanged. No edema.   Electronically Signed   By: Franchot Gallo M.D.   On: 12/28/2014 07:51   Dg Abd Portable 1v  12/28/2014   CLINICAL DATA:  Left ventricular assist device.  Ileus.  EXAM: PORTABLE ABDOMEN - 1 VIEW  COMPARISON:  12/27/2014  FINDINGS: Left ventricular assist device unchanged in position.  Gas is seen in large and small bowel. Improvement in mild small bowel dilatation seen previously. Findings most consistent with improving ileus.  IMPRESSION: Improving ileus.   Electronically Signed   By: Franchot Gallo M.D.   On: 12/28/2014 07:53      Medications:     Scheduled Medications: . allopurinol  100 mg Oral Daily  . antiseptic oral rinse  7 mL Mouth Rinse QID  . bisacodyl  10 mg Oral Daily   Or  . bisacodyl  10 mg Rectal Daily  . citalopram  20 mg Oral Daily  . digoxin  0.125 mg Intravenous Daily  . feeding supplement (ENSURE ENLIVE)  237 mL Oral TID BM  . metoCLOPramide (REGLAN) injection  10 mg Intravenous 4 times per day  . pantoprazole  40 mg Oral Daily  . piperacillin-tazobactam (ZOSYN)  IV  3.375 g Intravenous Q8H  . sodium chloride  10-40 mL Intracatheter Q12H  . sodium chloride  3 mL Intravenous Q12H  . Warfarin - Pharmacist Dosing Inpatient   Does not apply q1800     Infusions: . sodium chloride Stopped (12/22/14 0600)  . sodium chloride Stopped (12/28/14 0900)  . sodium chloride 250 mL (12/29/14 1000)  . amiodarone 30 mg/hr (12/29/14 0700)  . lactated ringers Stopped (12/20/14 2000)  . lactated ringers Stopped (12/22/14 0600)  . milrinone 0.125 mcg/kg/min (12/29/14 0700)     PRN Medications:  sodium chloride, hydrALAZINE,  lidocaine, lidocaine, lidocaine-EPINEPHrine, morphine injection, ondansetron (ZOFRAN)  IV, oxyCODONE, oxymetazoline, silver nitrate applicators, sodium chloride, sodium chloride, sodium chloride, traMADol, TRIPLE ANTIBIOTIC   Assessment:   1. Acute/chonic systolic HF , NICM EF 0000000 with Cardiogenic shock on milrinone and levophed, IABP preoop, s/p HMII implant 12/20/2014 2. Chronic atrial fib 3. Postop atrial flutter with RVR 4. Expected EBL anemia 5. Fever early postop, resolved 6. Acute post op non-oliguric renal failure 7. Epistaxis with supra-therapeutic INR.  Plan/Discussion:    POD 9 s/p HMII LVAD  He remains hemodynamically stable on Milrinone 0.125 with good pump parameters and Co-ox increased today. Creatinine continues to decrease. Keeping speed at 9200 per Dr. Aundra Dubin since there was some bowing of septum to the left on echo late this week. He will need ramp at some point.  Possible ileus resolving. He can start some liquids today. Needs nutrition.  Afib/flutter: rate controlled on amio IV.  Nasal bleeding: keep packing in and continue Zosyn while it is in.  INR rising despite holding coumadin due to vit K deficiency from not eating for a while. Continue to hold coumadin. If it is up any higher tomorrow will need to give FFP.  Continue IS and ambulation.  I reviewed the LVAD parameters from today, and compared the results to the patient's prior recorded data.  No programming changes were made.  The LVAD is functioning within specified parameters.  The patient performs LVAD self-test daily.  LVAD interrogation was negative for any significant power changes, alarms or PI events/speed drops.  LVAD equipment check completed and is in good working order.  Back-up equipment present.   LVAD education done on emergency procedures and precautions and reviewed exit site care.  Length of Stay: [address]  Fernande Boyden [facility] 12/29/2014, 11:06 AM

## 2014-12-29 NOTE — Progress Notes (Signed)
ANTIBIOTIC CONSULT NOTE - INITIAL    Assessment/Plan:  Pt to continue IV Zosyn until can take PO abx for nasal packing.  Will resume Zosyn 3.375g IV Q8H for CrCl ~65 ml/min and f/u change to PO.  Wynona Neat, PharmD, BCPS  12/29/2014,7:36 AM

## 2014-12-30 LAB — BASIC METABOLIC PANEL
ANION GAP: 8 (ref 5–15)
BUN: 20 mg/dL (ref 6–20)
CALCIUM: 8.8 mg/dL — AB (ref 8.9–10.3)
CO2: 26 mmol/L (ref 22–32)
Chloride: 106 mmol/L (ref 101–111)
Creatinine, Ser: 1.25 mg/dL — ABNORMAL HIGH (ref 0.61–1.24)
GLUCOSE: 105 mg/dL — AB (ref 65–99)
Potassium: 3.9 mmol/L (ref 3.5–5.1)
SODIUM: 140 mmol/L (ref 135–145)

## 2014-12-30 LAB — CBC
HCT: 25.1 % — ABNORMAL LOW (ref 39.0–52.0)
HEMOGLOBIN: 7.9 g/dL — AB (ref 13.0–17.0)
MCH: 21.1 pg — ABNORMAL LOW (ref 26.0–34.0)
MCHC: 31.5 g/dL (ref 30.0–36.0)
MCV: 67.1 fL — ABNORMAL LOW (ref 78.0–100.0)
Platelets: 613 10*3/uL — ABNORMAL HIGH (ref 150–400)
RBC: 3.74 MIL/uL — AB (ref 4.22–5.81)
RDW: 18.9 % — ABNORMAL HIGH (ref 11.5–15.5)
WBC: 9 10*3/uL (ref 4.0–10.5)

## 2014-12-30 LAB — LACTATE DEHYDROGENASE: LDH: 275 U/L — ABNORMAL HIGH (ref 98–192)

## 2014-12-30 LAB — CARBOXYHEMOGLOBIN
Carboxyhemoglobin: 1.7 % — ABNORMAL HIGH (ref 0.5–1.5)
Methemoglobin: 1 % (ref 0.0–1.5)
O2 Saturation: 53.9 %
Total hemoglobin: 8 g/dL — ABNORMAL LOW (ref 13.5–18.0)

## 2014-12-30 LAB — PROTIME-INR
INR: 4.28 — ABNORMAL HIGH (ref 0.00–1.49)
Prothrombin Time: 40 seconds — ABNORMAL HIGH (ref 11.6–15.2)

## 2014-12-30 LAB — DIGOXIN LEVEL: DIGOXIN LVL: 0.5 ng/mL — AB (ref 0.8–2.0)

## 2014-12-30 MED ORDER — POTASSIUM CHLORIDE CRYS ER 20 MEQ PO TBCR
20.0000 meq | EXTENDED_RELEASE_TABLET | Freq: Once | ORAL | Status: AC
  Administered 2014-12-30: 20 meq via ORAL
  Filled 2014-12-30: qty 1

## 2014-12-30 MED ORDER — FUROSEMIDE 10 MG/ML IJ SOLN
40.0000 mg | Freq: Once | INTRAMUSCULAR | Status: AC
  Administered 2014-12-30: 40 mg via INTRAVENOUS
  Filled 2014-12-30: qty 4

## 2014-12-30 NOTE — Progress Notes (Signed)
ANTICOAGULATION CONSULT NOTE - Follow Up Consult  Pharmacy Consult for Coumadin Indication: afib/LVAD  No Known Allergies  Patient Measurements: Height: 5\' 7"  (170.2 cm) Weight: 166 lb 10.7 oz (75.6 kg) IBW/kg (Calculated) : 66.1  Vital Signs: Temp: 97.5 F (36.4 C) (09/11 0824) Temp Source: Oral (09/11 0824) BP: 106/64 mmHg (09/11 0700)  Labs:  Recent Labs  12/28/14 0409 12/28/14 0410 12/29/14 0350 12/29/14 0800 12/29/14 1935 12/30/14 0413  HGB  --  7.8*  --  7.2*  --  7.9*  HCT  --  24.6*  --  23.1*  --  25.1*  PLT  --  523*  --  600*  --  613*  LABPROT 28.5*  --  35.4*  --   --  40.0*  INR 2.73*  --  3.64*  --   --  4.28*  CREATININE 1.33*  --   --  1.17 1.25* 1.25*    Estimated Creatinine Clearance: 69 mL/min (by C-G formula based on Cr of 1.25).  Assessment: 44yom on eliquis pta for afib/hx DVT, now transitioned to coumadin post LVAD. He received 4 doses and INR trended up to 4.45. He also developed epistaxis. Coumadin held and he received FFP on 9/8. INR initially trended down to 2.4 but is now trending back up to 4.28. Still with some oozing so he will receive more FFP today. Ileus imrpving so diet advanced to liquids - still probably vitamin k deficient. He is also on an amiodarone drip. Hgb 7.9.  Goal of Therapy:  INR 2-3 Monitor platelets by anticoagulation protocol: Yes   Plan:  1) No coumadin today 2) Follow daily INR  Deboraha Sprang 12/30/2014,10:46 AM

## 2014-12-30 NOTE — Progress Notes (Signed)
Discharge VAD teaching continued with patient and caregivers.    The patient's family has completed a home inspection checklist, this has been reviewed and no unsafe conditions have been identified.  Family has ensured two dedicated grounded, 3-prong outlets with clearly labeled circuit breaker has been established in the bedroom for power module and Charity fundraiser.  Both patient and caregivers have been trained on the following:  ____ Overview of major warnings and cautions  ____ HM II LVAD overview of system operation  ____    Overview of system components (features and functions) ____ Changing power source ____ Overview of alerts and alarms ____ How to identify an emergency ____ How to handle an emergency including when the pump is running and when the pump has stopped  ____ Changing system controller ____ Maintain emergency contact list  The patient and caregivers have successfully demonstrated:  ____ Changing power source (from batteries to power module, power module to  batteries, and replacing batteries) ____ Perform system controller self test  ____ Perform power module self test  ____ Check and charge batteries   The caregivers have successfully demonstrated:  ____ Change system controller  The patient is too tired, drowsy at this point to perform controller change out.  We will continue working and reviewing VAD alarms, troubleshooting, and controller change out after he is transferred to floor later this week.  A daily flow sheet with patient  weight, temperature,  flow, speed, power, and PI, along with daily self checks on system controller and power module has been reviewed with patient and caregivers and will be filled out daily during hospitalization and daily at home.   The caregivers are being trained on percutaneous lead exit site care and dressing changes and has observed a  dressing change yesterday with planned return demonstration later today under nursing  supervision. The importance of lead immobilization has been stressed to patient and caregivers using the attachment device.   The following routine activities and maintenance have been reviewed with patient and caregiver(s) and both verbalize understanding:  ____ Stressed importance of never disconnecting power from both controller power leads at the same time, and never disconnecting both batteries at the same time, or the pump will stop ____ Plug the power module (PM) or the universal Charity fundraiser (UBC) into properly grounded (3 prong) outlets dedicated to PM use. Do NOT use adapter (cheater plug) for ungrounded outlets or multiple portable socket outlets (power strips) ____ Do not connect the PM or UBC to an outlet controlled by wall switch or the device may not work ____ The PM's internal battery (that provides limited backup power to the LVAD in the event of AC mains power failure) remains charged as long as the PM is connected to [facility] or DC power ____ The PM's internal battery provides approximately 30 minutes of emergency backup power for the LVAD ____ Transfer from PM to batteries during [facility] mains power failure. The PM has internal backup battery that will power the pump while you transfer to batteries ____ Keep a backup system controller, charged batteries, battery clips, and             flashlight near you during sleep in case of electrical power outage ____ Clean battery, battery clip, and universal battery charger contacts weekly ____ Visually inspect percutaneous lead daily ____ Check cables and connectors when changing power source  ____    Rotate batteries; keep all eight batteries charged ____ Always have backup system controller, battery clips, fully  charged batteries, and spare fully charged batteries when traveling ____ Re-calibrate batteries every 70 uses; monitor battery life of 36 months or 360 uses; replace batteries at end of battery life   Identified the following changes  in activities of daily living with pump:  ____ No driving for at least six weeks and then only if doctor gives permission to do so ____ No tub baths while pump implanted, and shower only if doctor gives permission ____    No swimming or submersion in water while implanted with pump ____    Keep all VAD equipment away from water or moisture ____ Keep all VAD connections clean and dry ____ No contact sports or engage in jumping activities ____ Avoid strong static electricity (touching TV/computer screens, vacuuming) ____ Never have an MRI while implanted with the pump ____ Never leave or store batteries in extremely hot or cold places (such as   trunk of your car), or the battery life will be shortened ____ Call the doctor or hospital contact person if any change in how the pump sounds, feels, or works ____ Plan to sleep only when connected to the power module. ____    Keep a backup system controller, charged batteries, battery clips, and             flashlight near you during sleep in case of electrical power outage ____ Do not sleep on your stomach ____ Talk with doctor before any long distance travel plans ____ Patient will need antibiotics prior to any dental procedure; instructed to contact VAD coordinator before any dental procedures (including routine cleaning)   Discharge binder has been given to patient and include the following:  ____ Discharge instructions sheet ____ List of emergency contacts ____ Vanna [place] card ____    HM II Luggage tags ____ Guide to Percutaneous Lead Care and Equipment Maintenance ____ HM II Alarms for Patients and their Caregivers ____ HM II Patient Handbook (additional Handbook given to sisters and friend) ____ [facility] II Patient Education Program DVD ____ HM II Power Module Alarms, Care, Maintenance ____ Daily diary sheets ____    [facility] VAD Patient Education Booklet ____    Care of the Percutaneous Lead ____    HM II LVAS Emergency Response Checklist     Discussed frequency and importance of INR checks; emphasized importance of maintaining INR goal to prevent clotting and or bleeding issues with pump.  Patient will have INR managed per LVAD team; current INR goal is  2.0 - 3.0.     Based on information received from [facility] II manufacturer ([employer]) about reported difficulties that have occurred with some patients and caregivers when changing from primary to back up pocket controller, the following instructions were emphasized:         1. Patient should have a caregiver present during system controller exchange or call     911 and VAD pager before attempting to change controller if alone. 2. As part of the weekly safety checklist, the patient and caregiver should review and      understand the steps and instructions involved with replacing the system controller.  3. As part of monthly safety checklist, the patient and caregiver should review and                     understand the system controller alarms and how to resolve them.  4. Updated copy of HM II Guide to Replacing the Running System Controller with the  Backup Controller for Patients and Their Caregiveriven reviewed and given to the                  patient.  5. HM II Home Flowsheets given that include the weekly and monthly checks as      noted above.  When to call VAD Coordinator  . Bleeding: if you have any nose bleeds, notice any blood in your urine or stool, or you have any black tarry stools  . Dark colored urine: such as the color of tea, coffee, or cola. . Dizziness . Unexplained increase in shortness of breath . Weight gain of more than 3 pounds in 24 hours or 5 pounds in a week . Changes in driveline site: increase in redness, tenderness, drainage, or odor . Any trauma to driveline: such as controller drop, tug on driveline, etc . Temperature of 100 F or greater or any chills . Experience any problems with your batteries maintaining their charge or unable to  calibrate . Notice that your pump power suddenly increases to 10 watts or greater . Notice yellow wrench visual alarms on your power module or system controller . Suddenly can't perform a system controller self test . Any red heart alarms or yellow wrench symbols   If you have any of the above problems contact your VAD Coordinator at [phone]    The patient has completed a proficiency test for the HM II and all questions have been answered. The pt and family have been instructed to call if any questions, problems, or concerns arise. Pt and caregiver successfully paged VAD coordinator using VAD pager emergency number and have been instructed to use this number only for emergencies. Patient and caregiver(s) asked appropriate questions, had good interaction with VAD coordinator, and verbalized understanding of above instructions.

## 2014-12-30 NOTE — Progress Notes (Signed)
Patient ID: [name], male   DOB: 02/20/1971, 44 y.o.   MRN: [id number]           VAD Team Rounding Note  Subjective:    s/p HMII implant 9/1  9/6 Milrinone cut back to 0.125 mcg.  VAD was increased to 9200.  9/7 Continued on milrinone 0.125 mcg. INR 4.4 Transferred to 2W 9/8 Developed epitaxis. Coughing up clots. INR 4.23 Given FFP.  CXR ok   Has had trouble with epistaxis, seen by ENT and right nostril packed.  Still with some oozing, INR up to 4.28 today.  Walked yesterday, seems to have done well.  Co-ox stable at 54% today, remains on milrinone 0.125 and amiodarone for rate control.  Creatinine stable.   Ileus improving, advancing to liquids.    LVAD INTERROGATION:  HeartMate II LVAD:  Flow 3.8 liters/min, speed 9200, power 4.8,  PI 8.3.  No PI events.   Objective:    Vital Signs:   Temp:  [97.3 F (36.3 C)-98.1 F (36.7 C)] 97.5 F (36.4 C) (09/11 0824) Resp:  [11-22] 13 (09/11 0700) BP: (90-116)/(64-100) 106/64 mmHg (09/11 0700) SpO2:  [95 %-100 %] 98 % (09/11 0700) Weight:  [166 lb 10.7 oz (75.6 kg)] 166 lb 10.7 oz (75.6 kg) (09/11 0700) Last BM Date: 12/29/14 Mean arterial Pressure 88  Intake/Output:   Intake/Output Summary (Last 24 hours) at 12/30/14 0843 Last data filed at 12/30/14 0700  Gross per 24 hour  Intake 2500.8 ml  Output    900 ml  Net 1600.8 ml     Physical Exam:  General: Sitting in chair. NAD  HEENT: Right nasal packing.  Neck: supple. JVP 8-9 cm Cor Sternal dressing in place.  Mechanical heart sounds with LVAD hum present. Lungs: Decreased in the bases.  Abdomen: soft, nontender, nondistended. hypoactive bowel sounds. Driveline: C/D/I; securement device intact and driveline incorporated Extremities: no cyanosis, clubbing, rash, R and LLE SCDs.  Neuro: awake conversant GU: Foley   Telemetry: AF 90s-100s  Labs: Basic Metabolic Panel:  Recent Labs Lab 12/24/14 0334  12/25/14 0500 12/26/14 0517 12/27/14 0047 12/27/14 1557  12/28/14 0409 12/29/14 0800 12/29/14 1935 12/30/14 0413  NA 130*  < > 132* 134* 135 137 140 141 138 140  K 3.3*  < > 3.7 3.4* 4.1 3.6 3.4* 3.2* 4.2 3.9  CL 90*  < > 90* 91* 95* 96* 100* 104 103 106  CO2 29  --  30 31 29   --  31 27 26 26   GLUCOSE 120*  < > 106* 115* 110* 130* 126* 153* 90 105*  BUN 34*  < > 41* 51* 46* 38* 30* 23* 22* 20  CREATININE 2.03*  < > 2.18* 2.08* 1.61* 1.50* 1.33* 1.17 1.25* 1.25*  CALCIUM 9.0  --  8.9 8.9 8.9  --  8.6* 8.1* 8.5* 8.8*  MG 2.1  --  2.1 2.2 2.5*  --   --   --   --   --   PHOS 5.6*  --  5.5* 3.9 2.0*  --   --   --   --   --   < > = values in this interval not displayed.  Liver Function Tests:  Recent Labs Lab 12/24/14 0334 12/25/14 0500 12/26/14 0517 12/27/14 0047 12/28/14 0409  AST 31 26 23 31 31   ALT 17 16* 16* 19 21  ALKPHOS 62 66 86 115 116  BILITOT 2.3* 1.8* 1.7* 1.4* 1.3*  PROT 6.4* 6.3* 6.7 6.9 6.5  ALBUMIN 2.6* 2.5* 2.6* 2.6* 2.3*   No results for input(s): LIPASE, AMYLASE in the last 168 hours. No results for input(s): AMMONIA in the last 168 hours.  CBC:  Recent Labs Lab 12/24/14 0334  12/25/14 0500 12/26/14 0517 12/27/14 0047 12/27/14 0615 12/27/14 0750 12/27/14 1557 12/28/14 0410 12/29/14 0800 12/30/14 0413  WBC 14.3*  --  10.2 9.5 11.5* 12.0* 12.5*  --  12.6* 10.3 9.0  NEUTROABS 11.6*  --  8.0* 6.6 8.0*  --   --   --   --   --   --   HGB 8.7*  < > 7.9* 8.3* 9.0* 8.4* 8.4* 9.9* 7.8* 7.2* 7.9*  HCT 26.1*  < > 23.7* 25.5* 27.5* 26.4* 26.0* 29.0* 24.6* 23.1* 25.1*  MCV 62.9*  --  63.0* 62.7* 64.6* 63.8* 65.0*  --  66.0* 66.4* 67.1*  PLT 264  --  274 365 435* 526* 496*  --  523* 600* 613*  < > = values in this interval not displayed.  INR:  Recent Labs Lab 12/27/14 0047 12/27/14 1200 12/28/14 0409 12/29/14 0350 12/30/14 0413  INR 4.23* 2.41* 2.73* 3.64* 4.28*    Other results:   Imaging: No results found.   Medications:     Scheduled Medications: . allopurinol  100 mg Oral Daily  .  antiseptic oral rinse  7 mL Mouth Rinse QID  . bisacodyl  10 mg Oral Daily   Or  . bisacodyl  10 mg Rectal Daily  . citalopram  20 mg Oral Daily  . digoxin  0.125 mg Intravenous Daily  . feeding supplement (ENSURE ENLIVE)  237 mL Oral TID BM  . metoCLOPramide (REGLAN) injection  10 mg Intravenous 4 times per day  . pantoprazole  40 mg Oral Daily  . piperacillin-tazobactam (ZOSYN)  IV  3.375 g Intravenous Q8H  . sodium chloride  10-40 mL Intracatheter Q12H  . sodium chloride  3 mL Intravenous Q12H  . Warfarin - Pharmacist Dosing Inpatient   Does not apply q1800    Infusions: . sodium chloride Stopped (12/22/14 0600)  . sodium chloride Stopped (12/28/14 0900)  . sodium chloride 250 mL (12/30/14 0700)  . amiodarone 30 mg/hr (12/30/14 0700)  . lactated ringers Stopped (12/20/14 2000)  . lactated ringers Stopped (12/22/14 0600)  . milrinone 0.125 mcg/kg/min (12/30/14 0700)    PRN Medications: sodium chloride, hydrALAZINE, lidocaine, lidocaine, lidocaine-EPINEPHrine, morphine injection, ondansetron (ZOFRAN) IV, oxyCODONE, oxymetazoline, silver nitrate applicators, sodium chloride, sodium chloride, sodium chloride, traMADol, TRIPLE ANTIBIOTIC   Assessment:   1. Cardiogenic shock s/p HMII implant 12/20/2014 2. Acute/chonic systolic HF   -- NICM EF 0000000 3. Chronic AF with RVR 4. Expected EBL anemia 5. Acute on chronic renal failure 6. PNA  7. Anemia  8. Epistaxis: right nasal packing.  9. Hemoptysis 10. Ileus: Resolving  Plan/Discussion:   S/P HMII LVAD implant on 9/1   Epistaxis, right nostril now packed.  ENT recommends leaving packing in for 5-7 days.  Still with mild oozing and INR higher today.  Needs to remain on antibiotics while packing in place, on Zosyn currently.  When taking pills can switch to Augmentin.   Given higher INR today and ongoing oozing, he is to get FFP.  Will give IV Lasix along with this.    Suspect mild volume overload, will give a dose of IV  Lasix as above.    Co-ox 54% today which is stable.  Continue milrinone 0.125.  9/8 echo appeared to show the IV septum  bowing some towards the left, but he has had few PI events.  I would not increase the LVAD speed at this point.  Think a formal ramp echo on Monday would be helpful to see if we can increase his speed.   MAP ok.    AF rate 90s-100s. Continue IV amio 30 mg per hour and transition to po once ileus resolved. Poor response to BB on 9/6.  Ileus: improving, advancing to liquids.    INR 4.45>4.23>3.64>4.28. Holding coumadin.  As above, getting FFP.    I reviewed the LVAD parameters from today, and compared the results to the patient's prior recorded data.  No programming changes were made.  The LVAD is functioning within specified parameters.  The patient performs LVAD self-test daily.  LVAD interrogation was negative for any significant power changes, alarms or PI events/speed drops.  LVAD equipment check completed and is in good working order.  Back-up equipment present.   LVAD education done on emergency procedures and precautions and reviewed exit site care.  Length of Stay: [place] 12/30/2014 8:43 AM

## 2014-12-30 NOTE — Progress Notes (Signed)
Patient ID: [name], male   DOB: 1970-10-16, 44 y.o.   MRN: [id number]  HeartMate 2 Rounding Note  Subjective:    No specific complaints except packing in nose.   Walked 200 ft and 300 ft today.  Having more formed BM.  Co-ox 54 stable on milrinone 0.125  LVAD INTERROGATION:  HeartMate II LVAD:  Flow 3.8 liters/min, speed 9200, power 4.8, PI 8.3.    Objective:    Vital Signs:   Temp:  [97.3 F (36.3 C)-98.1 F (36.7 C)] 97.3 F (36.3 C) (09/11 1045) Pulse Rate:  [92-97] 97 (09/11 1045) Resp:  [11-22] 13 (09/11 0700) BP: (90-116)/(64-89) 97/73 mmHg (09/11 1045) SpO2:  [95 %-100 %] 98 % (09/11 0700) Weight:  [75.6 kg (166 lb 10.7 oz)] 75.6 kg (166 lb 10.7 oz) (09/11 0700) Last BM Date: 12/29/14 Mean arterial Pressure 70's  Intake/Output:   Intake/Output Summary (Last 24 hours) at 12/30/14 1305 Last data filed at 12/30/14 1200  Gross per 24 hour  Intake 1982.8 ml  Output   1400 ml  Net  582.8 ml     Physical Exam: General:  Tired appearing but just finished a walk. No resp difficulty HEENT: normal Neck: supple. JVP . Carotids 2+ bilat; no bruits. No lymphadenopathy or thryomegaly appreciated. Cor: Normal heart sounds with LVAD hum present. Lungs: clear Abdomen: soft, nontender, nondistended. No hepatosplenomegaly. No bruits or masses. Good bowel sounds. Extremities: no cyanosis, clubbing, rash, edema Neuro: alert & orientedx3, cranial nerves grossly intact. moves all 4 extremities w/o difficulty. Affect pleasant  Telemetry: a-flutter  Labs: Basic Metabolic Panel:  Recent Labs Lab 12/24/14 0334  12/25/14 0500 12/26/14 0517 12/27/14 0047 12/27/14 1557 12/28/14 0409 12/29/14 0800 12/29/14 1935 12/30/14 0413  NA 130*  < > 132* 134* 135 137 140 141 138 140  K 3.3*  < > 3.7 3.4* 4.1 3.6 3.4* 3.2* 4.2 3.9  CL 90*  < > 90* 91* 95* 96* 100* 104 103 106  CO2 29  --  30 31 29   --  31 27 26 26   GLUCOSE 120*  < > 106* 115* 110* 130* 126* 153* 90 105*   BUN 34*  < > 41* 51* 46* 38* 30* 23* 22* 20  CREATININE 2.03*  < > 2.18* 2.08* 1.61* 1.50* 1.33* 1.17 1.25* 1.25*  CALCIUM 9.0  --  8.9 8.9 8.9  --  8.6* 8.1* 8.5* 8.8*  MG 2.1  --  2.1 2.2 2.5*  --   --   --   --   --   PHOS 5.6*  --  5.5* 3.9 2.0*  --   --   --   --   --   < > = values in this interval not displayed.  Liver Function Tests:  Recent Labs Lab 12/24/14 0334 12/25/14 0500 12/26/14 0517 12/27/14 0047 12/28/14 0409  AST 31 26 23 31 31   ALT 17 16* 16* 19 21  ALKPHOS 62 66 86 115 116  BILITOT 2.3* 1.8* 1.7* 1.4* 1.3*  PROT 6.4* 6.3* 6.7 6.9 6.5  ALBUMIN 2.6* 2.5* 2.6* 2.6* 2.3*   No results for input(s): LIPASE, AMYLASE in the last 168 hours. No results for input(s): AMMONIA in the last 168 hours.  CBC:  Recent Labs Lab 12/24/14 0334  12/25/14 0500 12/26/14 0517 12/27/14 0047 12/27/14 0615 12/27/14 0750 12/27/14 1557 12/28/14 0410 12/29/14 0800 12/30/14 0413  WBC 14.3*  --  10.2 9.5 11.5* 12.0* 12.5*  --  12.6* 10.3 9.0  NEUTROABS 11.6*  --  8.0* 6.6 8.0*  --   --   --   --   --   --   HGB 8.7*  < > 7.9* 8.3* 9.0* 8.4* 8.4* 9.9* 7.8* 7.2* 7.9*  HCT 26.1*  < > 23.7* 25.5* 27.5* 26.4* 26.0* 29.0* 24.6* 23.1* 25.1*  MCV 62.9*  --  63.0* 62.7* 64.6* 63.8* 65.0*  --  66.0* 66.4* 67.1*  PLT 264  --  274 365 435* 526* 496*  --  523* 600* 613*  < > = values in this interval not displayed.  INR:  Recent Labs Lab 12/27/14 0047 12/27/14 1200 12/28/14 0409 12/29/14 0350 12/30/14 0413  INR 4.23* 2.41* 2.73* 3.64* 4.28*   LDH 275  Other results:  EKG:   Imaging:  No results found.   Medications:     Scheduled Medications: . allopurinol  100 mg Oral Daily  . antiseptic oral rinse  7 mL Mouth Rinse QID  . bisacodyl  10 mg Oral Daily   Or  . bisacodyl  10 mg Rectal Daily  . citalopram  20 mg Oral Daily  . digoxin  0.125 mg Intravenous Daily  . feeding supplement (ENSURE ENLIVE)  237 mL Oral TID BM  . metoCLOPramide (REGLAN) injection  10 mg  Intravenous 4 times per day  . pantoprazole  40 mg Oral Daily  . piperacillin-tazobactam (ZOSYN)  IV  3.375 g Intravenous Q8H  . sodium chloride  10-40 mL Intracatheter Q12H  . sodium chloride  3 mL Intravenous Q12H  . Warfarin - Pharmacist Dosing Inpatient   Does not apply q1800     Infusions: . sodium chloride Stopped (12/22/14 0600)  . sodium chloride Stopped (12/28/14 0900)  . sodium chloride 250 mL (12/30/14 0700)  . amiodarone 30 mg/hr (12/30/14 0700)  . lactated ringers Stopped (12/20/14 2000)  . lactated ringers Stopped (12/22/14 0600)  . milrinone 0.125 mcg/kg/min (12/30/14 0700)     PRN Medications:  sodium chloride, hydrALAZINE, lidocaine, lidocaine, lidocaine-EPINEPHrine, morphine injection, ondansetron (ZOFRAN) IV, oxyCODONE, oxymetazoline, silver nitrate applicators, sodium chloride, sodium chloride, sodium chloride, traMADol, TRIPLE ANTIBIOTIC   Assessment:   1. Acute/chonic systolic HF , NICM EF 0000000 with Cardiogenic shock on milrinone and levophed, IABP preoop, s/p HMII implant 12/20/2014 2. Chronic atrial fib 3. Postop atrial flutter with RVR 4. Expected EBL anemia 5. Fever early postop, resolved 6. Acute post op non-oliguric renal failure 7. Epistaxis with supra-therapeutic INR.  Plan/Discussion:    He remains hemodynamically stable on Milrinone 0.125 with good pump parameters and Co-ox stable today. Creatinine continues to decrease. Keeping speed at 9200 per Dr. Aundra Dubin since there was some bowing of septum to the left on echo late this week. He will need ramp at some point.  Afib/flutter: rate controlled on amio IV.  Nasal bleeding: keep packing in and continue Zosyn while it is in.  INR rising despite holding coumadin due to vit K deficiency from not eating for a while. Continue to hold coumadin. 2 units FFP ordered today.  Encourage nutrition, Ensure. Ileus seems to be resolved.  Continue mobilization and IS.   I reviewed the LVAD parameters  from today, and compared the results to the patient's prior recorded data.  No programming changes were made.  The LVAD is functioning within specified parameters.  The patient performs LVAD self-test daily.  LVAD interrogation was negative for any significant power changes, alarms or PI events/speed drops.  LVAD equipment check completed and is in good working order.  Back-up equipment present.   LVAD education done on emergency procedures and precautions and reviewed exit site care.  Length of Stay: [address]  Fernande Boyden [facility] 12/30/2014, 1:05 PM

## 2014-12-31 LAB — BASIC METABOLIC PANEL
Anion gap: 6 (ref 5–15)
BUN: 23 mg/dL — AB (ref 6–20)
CHLORIDE: 104 mmol/L (ref 101–111)
CO2: 30 mmol/L (ref 22–32)
CREATININE: 1.35 mg/dL — AB (ref 0.61–1.24)
Calcium: 8.6 mg/dL — ABNORMAL LOW (ref 8.9–10.3)
GFR calc Af Amer: >60 mL/min (ref >60)
GLUCOSE: 98 mg/dL (ref 65–99)
Potassium: 3.8 mmol/L (ref 3.5–5.1)
SODIUM: 140 mmol/L (ref 135–145)

## 2014-12-31 LAB — CBC
HCT: 23.9 % — ABNORMAL LOW (ref 39.0–52.0)
Hemoglobin: 7.3 g/dL — ABNORMAL LOW (ref 13.0–17.0)
MCH: 20.2 pg — ABNORMAL LOW (ref 26.0–34.0)
MCHC: 30.5 g/dL (ref 30.0–36.0)
MCV: 66.2 fL — AB (ref 78.0–100.0)
PLATELETS: 619 10*3/uL — AB (ref 150–400)
RBC: 3.61 MIL/uL — ABNORMAL LOW (ref 4.22–5.81)
RDW: 19.4 % — AB (ref 11.5–15.5)
WBC: 7.3 10*3/uL (ref 4.0–10.5)

## 2014-12-31 LAB — CARBOXYHEMOGLOBIN
CARBOXYHEMOGLOBIN: 1.8 % — AB (ref 0.5–1.5)
Methemoglobin: 0.9 % (ref 0.0–1.5)
O2 Saturation: 47.6 %
Total hemoglobin: 6.7 g/dL — CL (ref 13.5–18.0)

## 2014-12-31 LAB — PREPARE FRESH FROZEN PLASMA
Unit division: 0
Unit division: 0

## 2014-12-31 LAB — PROTIME-INR
INR: 2.56 — AB (ref 0.00–1.49)
Prothrombin Time: 27.2 seconds — ABNORMAL HIGH (ref 11.6–15.2)

## 2014-12-31 LAB — LACTATE DEHYDROGENASE: LDH: 254 U/L — AB (ref 98–192)

## 2014-12-31 LAB — PREPARE RBC (CROSSMATCH)

## 2014-12-31 MED ORDER — AMIODARONE HCL 200 MG PO TABS
400.0000 mg | ORAL_TABLET | Freq: Two times a day (BID) | ORAL | Status: DC
  Administered 2014-12-31 – 2015-01-02 (×5): 400 mg via ORAL
  Filled 2014-12-31 (×7): qty 2

## 2014-12-31 MED ORDER — IRON DEXTRAN 50 MG/ML IJ SOLN
100.0000 mg | Freq: Once | INTRAMUSCULAR | Status: AC
  Administered 2014-12-31: 100 mg via INTRAVENOUS
  Filled 2014-12-31: qty 2

## 2014-12-31 NOTE — Progress Notes (Signed)
Admitted 12/14/14 per Dr. Haroldine Laws due to acute/chonic systolic HF with cardiogenic shock.  HeartMate II LVAD implated on 12/20/14 by Dr. Cyndia Bent as destination therapy VAD.    Vital signs: Temp:  97.7 - 98.5  HR: 90 - 100  AFib/Aflutter Auto cuff MAP:  75 - 92 O2 Sat: 96 - 98% RA Wt in lbs:  169 > 187> ... > 169> 169> 169> 167>166>166>169  Intra-op blood products: 4FFP 1 plt 1100 cell saver  ICU Blood Products:  12/27/14: 1 FFP (nosebleeds with INR > 4) 12/30/14: 2 units FFP (nosebleed with INR 4.28)  Gtts: Milrinone 0.125  Amiodarone 30 mg/h   Nitric Oxide: Off 12/23/14  Anticoagulants: ASA 325mg  started 12/21/14 Coumadin started 12/22/14  INR Goal 2 - 3  LVAD interrogation reveals:  Speed:  9200 Flow:  3.5 Power: 4.6 PI: 8.2 Alarms:  None Events:  None  Fixed speed:  9200 Low speed limit:  8600  Drive Line:   Gauze dressing intact with attachment device accurately applied; being changed daily.   Arrhythmia:  12/20/14: AFlutter/AFib with RVR requiring multiple boluses and continuous infusion Amiodarone.   Labs:   Co-ox:  54 > ...> 78.6> 54> 55> 54.9> 47.3 > 49 > 55.7 > 53.9 > 47.6 (on 0.125 Milrinone)   Hgb:  11.2 > 8.3> 8.7> 7.9 (Iron PO started)> 7.8> 8.4> 7.8 > 7.2 > 7.9 > 7.3  WBC:  8.5 > 9.5> 14.5> 19.1> ...>11.5> 12.0> 12.5> 12.6>10.3>9.0>7.3  LDH: 279> 220> 305 (peak)> 286> 254> 264> 25 6> 269 > 275 > 254  INR: ...> 2.52> 4.45> 4.23 (FFP for Nosebleeds)> 2.41> 2.73 > 3.64 > 4.28 > 2.56  Creatinine: 1.20 - 1.4 (baseline pre-op)... > 2.03> 2.0> 2.18> 1.33 > 1.17 > 1.25 > 1.35  Transfers:  12/26/14-->PTCU 2W25 12/27/14-->transferred back to ICU 2S12 for nosebleeds/hemoptysis   Plan/Recommendation as D/W Team:  1.  Discharge teaching completed with caregivers (two sisters and co-worker) yesterday. Plan for continued d/c teaching with patient this week.   2.  Will deliver pt home equipment when transferred to floor.

## 2014-12-31 NOTE — Social Work (Signed)
CSW met with patient at bedside. Patient reports he is feeling better and getting stronger. Patient reported that his sisters visited over the weekend and had some VAD training. He reports his sisters are ready for caregiving role and hopeful for discharge home soon. Patient stated he is feeling better mentally as well. CSW provided supportive intervention and will continue to follow through recovery. Raquel Sarna, [place]

## 2014-12-31 NOTE — Progress Notes (Addendum)
Patient ID: [name], male   DOB: 03/08/1971, 44 y.o.   MRN: [id number]  HeartMate 2 Rounding Note POD #  11  Subjective:   preop acute on chronc HF, cardiogenic shock, IABP Postop: Extubated pod1, developed rapid afib-flutter refractory to amiodarone, digoxin- now much improved  Postop RV dysfunction requiring prolonged milrinone  infusion 0.25  Postoperative nosebleed requiring nasal packing-now stable with normal therapeutic levels of Coumadin Postoperative ileus slowly improving and diet being resumed  Postoperative expected anemia now worse due to recent nosebleeds.  LVAD function has been excellent, speed is been adjusted to 9200  Patient has been ambulatory, all chest tubes have been removed and surgical incisions are healing.   LVAD INTERROGATION:  HeartMate II LVAD:  Flow 4.1 liters/min, speed 9200, power 4.3, PI 5.8.    Objective:    Vital Signs:   Temp:  [97.6 F (36.4 C)-98.3 F (36.8 C)] 98 F (36.7 C) (09/12 1100) Resp:  [13-26] 26 (09/12 1200) BP: (88-124)/(66-93) 99/77 mmHg (09/12 1200) SpO2:  [95 %-100 %] 100 % (09/12 1200) Weight:  [169 lb 1.5 oz (76.7 kg)] 169 lb 1.5 oz (76.7 kg) (09/12 0600) Last BM Date: 12/29/14 Mean arterial Pressure 80's  Intake/Output:   Intake/Output Summary (Last 24 hours) at 12/31/14 1601 Last data filed at 12/31/14 1400  Gross per 24 hour  Intake   2543 ml  Output    502 ml  Net   2041 ml     Physical Exam: General:  Well appearing. No resp difficulty HEENT: normal-nasal packing in the right nostril-day #4 Cor: distant heart sounds with LVAD hum present. Lungs: clear Abdomen: soft, nontender, nondistended. No hepatosplenomegaly. No bruits or masses. hypoactive bowel sounds. Extremities: no cyanosis, clubbing, rash, moderate edema Neuro: alert & orientedx3, cranial nerves grossly intact. moves all 4 extremities w/o difficulty. Affect pleasant  Telemetry: atrial fibrillation heart rate 110  Labs: Basic  Metabolic Panel:  Recent Labs Lab 12/25/14 0500 12/26/14 0517 12/27/14 0047  12/28/14 0409 12/29/14 0800 12/29/14 1935 12/30/14 0413 12/31/14 0413  NA 132* 134* 135  < > 140 141 138 140 140  K 3.7 3.4* 4.1  < > 3.4* 3.2* 4.2 3.9 3.8  CL 90* 91* 95*  < > 100* 104 103 106 104  CO2 30 31 29   --  31 27 26 26 30   GLUCOSE 106* 115* 110*  < > 126* 153* 90 105* 98  BUN 41* 51* 46*  < > 30* 23* 22* 20 23*  CREATININE 2.18* 2.08* 1.61*  < > 1.33* 1.17 1.25* 1.25* 1.35*  CALCIUM 8.9 8.9 8.9  --  8.6* 8.1* 8.5* 8.8* 8.6*  MG 2.1 2.2 2.5*  --   --   --   --   --   --   PHOS 5.5* 3.9 2.0*  --   --   --   --   --   --   < > = values in this interval not displayed.  Liver Function Tests:  Recent Labs Lab 12/25/14 0500 12/26/14 0517 12/27/14 0047 12/28/14 0409  AST 26 23 31 31   ALT 16* 16* 19 21  ALKPHOS 66 86 115 116  BILITOT 1.8* 1.7* 1.4* 1.3*  PROT 6.3* 6.7 6.9 6.5  ALBUMIN 2.5* 2.6* 2.6* 2.3*   No results for input(s): LIPASE, AMYLASE in the last 168 hours. No results for input(s): AMMONIA in the last 168 hours.  CBC:  Recent Labs Lab 12/25/14 0500 12/26/14 0517 12/27/14 0047  12/27/14  [id number] 12/27/14 1557 12/28/14 0410 12/29/14 0800 12/30/14 0413 12/31/14 0413  WBC 10.2 9.5 11.5*  < > 12.5*  --  12.6* 10.3 9.0 7.3  NEUTROABS 8.0* 6.6 8.0*  --   --   --   --   --   --   --   HGB 7.9* 8.3* 9.0*  < > 8.4* 9.9* 7.8* 7.2* 7.9* 7.3*  HCT 23.7* 25.5* 27.5*  < > 26.0* 29.0* 24.6* 23.1* 25.1* 23.9*  MCV 63.0* 62.7* 64.6*  < > 65.0*  --  66.0* 66.4* 67.1* 66.2*  PLT 274 365 435*  < > 496*  --  523* 600* 613* 619*  < > = values in this interval not displayed.  INR:  Recent Labs Lab 12/27/14 1200 12/28/14 0409 12/29/14 0350 12/30/14 0413 12/31/14 0413  INR 2.41* 2.73* 3.64* 4.28* 2.56*    Other results:  EKG:   Imaging: No results found.   Medications:     Scheduled Medications: . allopurinol  100 mg Oral Daily  . amiodarone  400 mg Oral BID  .  antiseptic oral rinse  7 mL Mouth Rinse QID  . bisacodyl  10 mg Oral Daily   Or  . bisacodyl  10 mg Rectal Daily  . citalopram  20 mg Oral Daily  . digoxin  0.125 mg Intravenous Daily  . feeding supplement (ENSURE ENLIVE)  237 mL Oral TID BM  . metoCLOPramide (REGLAN) injection  10 mg Intravenous 4 times per day  . pantoprazole  40 mg Oral Daily  . piperacillin-tazobactam (ZOSYN)  IV  3.375 g Intravenous Q8H  . sodium chloride  10-40 mL Intracatheter Q12H  . sodium chloride  3 mL Intravenous Q12H  . Warfarin - Pharmacist Dosing Inpatient   Does not apply q1800    Infusions: . sodium chloride Stopped (12/22/14 0600)  . sodium chloride Stopped (12/28/14 0900)  . sodium chloride 250 mL (12/31/14 1000)  . lactated ringers Stopped (12/20/14 2000)  . lactated ringers Stopped (12/22/14 0600)  . milrinone 0.25 mcg/kg/min (12/31/14 1000)    PRN Medications: sodium chloride, hydrALAZINE, lidocaine, lidocaine, lidocaine-EPINEPHrine, morphine injection, ondansetron (ZOFRAN) IV, oxyCODONE, oxymetazoline, silver nitrate applicators, sodium chloride, sodium chloride, sodium chloride, traMADol, TRIPLE ANTIBIOTIC   Assessment:   1. Acute/chonic systolic HF , NICM EF 0000000 with Cardiogenic shock on milrinone and levophed, IABP preoop, s/p HMII implant 12/20/2014 2. Chronic atrial fib 3. Postop atrial flutter with RVR 4. Expected EBL anemia-now worsened with recent nosebleeds 5. Fever early postop- resolved 6  postop ileus, now resolved   Plan/Discussion:    He is hemodynamically  stable. VAD flow has improved with inc speed . HR much better with time   Chest tube output decreased  DCL PT  BID  ambulate Avoid transfusion if possible since he may b . With hemoglobin following to 7.3 and low co-ox saturations requiring increase in milrinone as well as risk of recurrent nosebleeds will transfuse one unit of packed cells this p.m. and possibly another unit tomorrow  He  some volume excess  now. Will continue  diuretic.   Marland KitchenCoumadin -hold dose today with INR 2.6 and recent nosebleeds.     I reviewed the LVAD parameters from today, and compared the results to the patient's prior recorded data.  No programming changes were made.  The LVAD is functioning within specified parameters.  The patient performs LVAD self-test daily.  LVAD interrogation was negative for any significant power changes, alarms or PI events/speed drops.  LVAD equipment  check completed and is in good working order.  Back-up equipment present.   LVAD education done on emergency procedures and precautions and reviewed exit site care.  Length of Stay: [place] III 12/31/2014, 4:01 PM

## 2014-12-31 NOTE — Progress Notes (Signed)
Physical Therapy Treatment Patient Details Name: [name] MRN: [id number] DOB: 01-Apr-1971 Today's Date: 12/31/2014    History of Present Illness Patient is a 44 yo male admitted 12/14/14 with heart failure. Patient s/p LVAD on 12/20/14. 9/8 developed bil nose bleed (anticoagulated) and required transfer back to ICU PMH: NICM, EF 20-25%, CHF, ICD, Afib, HTN, severe MR/TR, RA    PT Comments    Patient tolerated ambulation far better today (800 ft at good pace without rests; HR 102-112; on room air). Transitioning to batteries without cues. Patient continues to require education regarding safe and proper use of DME. Patient able to recall sternal precautions, however requires cues to maintain precautions with functional mobility.    Follow Up Recommendations  Home health PT;Supervision/Assistance - 24 hour     Equipment Recommendations  Other (comment) (4 wheeled walker with seat)    Recommendations for Other Services       Precautions / Restrictions Precautions Precautions: Sternal;Fall Precaution Comments: LVAD;  Restrictions Other Position/Activity Restrictions: Sternal precautions    Mobility  Bed Mobility Overal bed mobility: Needs Assistance Bed Mobility: Sit to Sidelying;Rolling;Supine to Sit Rolling: Supervision (Rt side to back)   Supine to sit: Min assist   Sit to sidelying: Min assist;+2 for safety/equipment General bed mobility comments: pt uses legs over EOB to anchor and pull himself from supine to sit with assist to torso; requires cues to pass through sidelying on return to bed and did much better with this technique  Transfers Overall transfer level: Needs assistance Equipment used: Pushed w/c Transfers: Sit to/from Stand Sit to Stand: +2 safety/equipment;Min guard         General transfer comment: Verbal cues for technique (scoot to edge) and for sternal precautons   Ambulation/Gait Ambulation/Gait assistance: Min guard;+2  safety/equipment Ambulation Distance (Feet): 800 Feet Assistive device:  (push w/c) Gait Pattern/deviations: WFL(Within Functional Limits) Gait velocity: only slightly below normal pace   General Gait Details: walking very well with no need for standing rest breaks; legs became a bit shakey after 700 ft however no LOB   Stairs            Wheelchair Mobility    Modified Rankin (Stroke Patients Only)       Balance           Standing balance support: No upper extremity supported Standing balance-Leahy Scale: Fair                      Cognition Arousal/Alertness: Awake/alert Behavior During Therapy: WFL for tasks assessed/performed Overall Cognitive Status: Within Functional Limits for tasks assessed                      Exercises Other Exercises Other Exercises: able to complete transfer to/from batteries without cues or assistance; continues to require assist with donning shoulder harness due to multiple lines/leads    General Comments        Pertinent Vitals/Pain Pain Assessment: No/denies pain    Home Living                      Prior Function            PT Goals (current goals can now be found in the care plan section) Acute Rehab PT Goals Patient Stated Goal: get home PT Goal Formulation: With patient Time For Goal Achievement: 01/12/15 Potential to Achieve Goals: Good Progress towards PT goals: Progressing toward goals (goals updated due  to timeframe)    Frequency  Min 3X/week    PT Plan Current plan remains appropriate    Co-evaluation             End of Session   Activity Tolerance: Patient tolerated treatment well Patient left: in bed;with call bell/phone within reach     Time: 1342-1420 PT Time Calculation (min) (ACUTE ONLY): 38 min  Charges:  $Gait Training: 38-52 mins                    G Codes:      Jkwon Treptow 01/29/2015, 2:42 PM Pager [phone]

## 2014-12-31 NOTE — Progress Notes (Signed)
ANTICOAGULATION CONSULT NOTE - Follow Up Consult  Pharmacy Consult for Coumadin Indication: afib/LVAD  No Known Allergies  Patient Measurements: Height: 5\' 7"  (170.2 cm) Weight: 169 lb 1.5 oz (76.7 kg) IBW/kg (Calculated) : 66.1  Vital Signs: Temp: 97.7 F (36.5 C) (09/12 0400) Temp Source: Axillary (09/12 0400) BP: 106/87 mmHg (09/12 0700)  Labs:  Recent Labs  12/29/14 0350  12/29/14 0800 12/29/14 1935 12/30/14 0413 12/31/14 0413  HGB  --   < > 7.2*  --  7.9* 7.3*  HCT  --   --  23.1*  --  25.1* 23.9*  PLT  --   --  600*  --  613* 619*  LABPROT 35.4*  --   --   --  40.0* 27.2*  INR 3.64*  --   --   --  4.28* 2.56*  CREATININE  --   < > 1.17 1.25* 1.25* 1.35*  < > = values in this interval not displayed.  Estimated Creatinine Clearance: 63.9 mL/min (by C-G formula based on Cr of 1.35).  Assessment: 44yom on eliquis pta for afib/hx DVT, now transitioned to coumadin post LVAD. He received 4 doses and INR trended up to 4.45. He also developed epistaxis. Coumadin held and he received FFP on 9/8.  INR initially trended down to 2.4 but then trended back up to 4.28 now back down to 2.5 after 2 units FFP. Still with some oozing. Ileus imrpving so diet advanced to liquids - still probably vitamin k deficient. He is also on an amiodarone drip. Hgb down to 7.3.  Goal of Therapy:  INR 2-3 Monitor platelets by anticoagulation protocol: Yes   Plan:  1) No coumadin today 2) Follow daily INR  Erin Hearing PharmD., BCPS Clinical Pharmacist Pager [phone] 12/31/2014 7:49 AM

## 2014-12-31 NOTE — Progress Notes (Signed)
Patient ID: [name], male   DOB: [birth year], 44 y.o.   MRN: [id number]           VAD Team Rounding Note  Subjective:    s/p HMII implant 9/1  9/6 Milrinone cut back to 0.125 mcg.  VAD was increased to 9200.  9/7 Continued on milrinone 0.125 mcg. INR 4.4 Transferred to 2W 9/8 Developed epitaxis. Coughing up clots. INR 4.23 Given FFP.  CXR ok  9/11 INR 4.28 Received 2 FFP 9/12 INR today 2.56 Hgb 7.3 on Amio 30 mg per hour + milrinone 0.125 mcg.   Has had trouble with epistaxis, seen by ENT and right nostril packed.  Still with some L nare oozing this morning when standing. INR today 2.56 .   Tolerating liquids. No N/V   LVAD INTERROGATION:  HeartMate II LVAD:  Flow 3.5 liters/min, speed 9200, power 4.4,  PI 8.43.  No PI events.   Objective:    Vital Signs:   Temp:  [97.3 F (36.3 C)-98.5 F (36.9 C)] 97.7 F (36.5 C) (09/12 0400) Pulse Rate:  [90-97] 93 (09/11 1325) Resp:  [13-20] 14 (09/12 0600) BP: (88-124)/(66-93) 100/82 mmHg (09/12 0600) SpO2:  [96 %-100 %] 98 % (09/12 0600) Weight:  [169 lb 1.5 oz (76.7 kg)] 169 lb 1.5 oz (76.7 kg) (09/12 0600) Last BM Date: 12/29/14 Mean arterial Pressure 88  Intake/Output:   Intake/Output Summary (Last 24 hours) at 12/31/14 0726 Last data filed at 12/31/14 0600  Gross per 24 hour  Intake 3264.13 ml  Output   1401 ml  Net 1863.13 ml     Physical Exam: CVP ~6  General: Sitting in chair. NAD  HEENT: Right nasal packing.  Neck: supple. JVP 8-9 cm Cor Sternal dressing in place.  Mechanical heart sounds with LVAD hum present. Lungs: Decreased in the bases.  Abdomen: soft, nontender, nondistended. hypoactive bowel sounds. Driveline: C/D/I; securement device intact and driveline incorporated Extremities: no cyanosis, clubbing, rash, no edema.  Neuro: awake conversant Flat affect  GU: Foley   Telemetry: AF 90s-100s  Labs: Basic Metabolic Panel:  Recent Labs Lab 12/25/14 0500 12/26/14 0517 12/27/14 0047   12/28/14 0409 12/29/14 0800 12/29/14 1935 12/30/14 0413 12/31/14 0413  NA 132* 134* 135  < > 140 141 138 140 140  K 3.7 3.4* 4.1  < > 3.4* 3.2* 4.2 3.9 3.8  CL 90* 91* 95*  < > 100* 104 103 106 104  CO2 30 31 29   --  31 27 26 26 30   GLUCOSE 106* 115* 110*  < > 126* 153* 90 105* 98  BUN 41* 51* 46*  < > 30* 23* 22* 20 23*  CREATININE 2.18* 2.08* 1.61*  < > 1.33* 1.17 1.25* 1.25* 1.35*  CALCIUM 8.9 8.9 8.9  --  8.6* 8.1* 8.5* 8.8* 8.6*  MG 2.1 2.2 2.5*  --   --   --   --   --   --   PHOS 5.5* 3.9 2.0*  --   --   --   --   --   --   < > = values in this interval not displayed.  Liver Function Tests:  Recent Labs Lab 12/25/14 0500 12/26/14 0517 12/27/14 0047 12/28/14 0409  AST 26 23 31 31   ALT 16* 16* 19 21  ALKPHOS 66 86 115 116  BILITOT 1.8* 1.7* 1.4* 1.3*  PROT 6.3* 6.7 6.9 6.5  ALBUMIN 2.5* 2.6* 2.6* 2.3*   No results for input(s): LIPASE, AMYLASE  in the last 168 hours. No results for input(s): AMMONIA in the last 168 hours.  CBC:  Recent Labs Lab 12/25/14 0500 12/26/14 0517 12/27/14 0047  12/27/14 0750 12/27/14 1557 12/28/14 0410 12/29/14 0800 12/30/14 0413 12/31/14 0413  WBC 10.2 9.5 11.5*  < > 12.5*  --  12.6* 10.3 9.0 7.3  NEUTROABS 8.0* 6.6 8.0*  --   --   --   --   --   --   --   HGB 7.9* 8.3* 9.0*  < > 8.4* 9.9* 7.8* 7.2* 7.9* 7.3*  HCT 23.7* 25.5* 27.5*  < > 26.0* 29.0* 24.6* 23.1* 25.1* 23.9*  MCV 63.0* 62.7* 64.6*  < > 65.0*  --  66.0* 66.4* 67.1* 66.2*  PLT 274 365 435*  < > 496*  --  523* 600* 613* 619*  < > = values in this interval not displayed.  INR:  Recent Labs Lab 12/27/14 1200 12/28/14 0409 12/29/14 0350 12/30/14 0413 12/31/14 0413  INR 2.41* 2.73* 3.64* 4.28* 2.56*    Other results:   Imaging: No results found.   Medications:     Scheduled Medications: . allopurinol  100 mg Oral Daily  . antiseptic oral rinse  7 mL Mouth Rinse QID  . bisacodyl  10 mg Oral Daily   Or  . bisacodyl  10 mg Rectal Daily  . citalopram   20 mg Oral Daily  . digoxin  0.125 mg Intravenous Daily  . feeding supplement (ENSURE ENLIVE)  237 mL Oral TID BM  . metoCLOPramide (REGLAN) injection  10 mg Intravenous 4 times per day  . pantoprazole  40 mg Oral Daily  . piperacillin-tazobactam (ZOSYN)  IV  3.375 g Intravenous Q8H  . sodium chloride  10-40 mL Intracatheter Q12H  . sodium chloride  3 mL Intravenous Q12H  . Warfarin - Pharmacist Dosing Inpatient   Does not apply q1800    Infusions: . sodium chloride Stopped (12/22/14 0600)  . sodium chloride Stopped (12/28/14 0900)  . sodium chloride 250 mL (12/31/14 0600)  . amiodarone 30 mg/hr (12/31/14 0600)  . lactated ringers Stopped (12/20/14 2000)  . lactated ringers Stopped (12/22/14 0600)  . milrinone 0.125 mcg/kg/min (12/31/14 0600)    PRN Medications: sodium chloride, hydrALAZINE, lidocaine, lidocaine, lidocaine-EPINEPHrine, morphine injection, ondansetron (ZOFRAN) IV, oxyCODONE, oxymetazoline, silver nitrate applicators, sodium chloride, sodium chloride, sodium chloride, traMADol, TRIPLE ANTIBIOTIC   Assessment:   1. Cardiogenic shock s/p HMII implant 12/20/2014 2. Acute/chonic systolic HF   -- NICM EF 0000000 3. Chronic AF with RVR 4. Expected EBL anemia 5. Acute on chronic renal failure 6. PNA  7. Anemia  8. Epistaxis: right nasal packing.  9. Hemoptysis 10. Ileus: Resolving  Plan/Discussion:   S/P HMII LVAD implant on 9/1   Epistaxis, right nostril now packed.  ENT recommends leaving packing in for 5-7 days.  Still with mild oozing L nare. INR today 2.56 . Hopefully can switch to po antibiotics today--> Augmentin.    Volume status ok. e.      Continue milrinone 0.125.  9/8 echo appeared to show the IV septum bowing some towards the left, but he has had few PI events.  Continue LVAD speed at this point.  Consider formal ECHO today.   MAP 73-80s. .   AF rate 90s-100s. Continue IV amio 30 mg per hour and transition to po once ileus resolved. Poor  response to BB on 9/6.  Ileus: improving, advancing to liquids.  Will wait for Dr Cyndia Bent to decide if  he can transition to pills.   Received  2 FFP 9/11-->INR 4.28>2.56      Length of Stay: 17  CLEGG,AMY NP-C  12/31/2014 7:26 AM   Patient seen and examined with Darrick Grinder, NP. We discussed all aspects of the encounter. I agree with the assessment and plan as stated above.   Improving slowly. Ileus resolving. AF rate improved, Co-ox is low sow ill increase milrinone to 0.25. LV is small so would not increase VAD speed at this point. VAD parameters are stable.    I reviewed the LVAD parameters from today, and compared the results to the patient's prior recorded data.  No programming changes were made.  The LVAD is functioning within specified parameters.  The patient performs LVAD self-test daily.  LVAD interrogation was negative for any significant power changes, alarms or PI events/speed drops.  LVAD equipment check completed and is in good working order.  Back-up equipment present.   LVAD education done on emergency procedures and precautions and reviewed exit site care.  Nas Wafer,MD 2:26 PM

## 2014-12-31 NOTE — Progress Notes (Signed)
[name] is lying in bed - ready to have nasal packing d/c. Says pain is much better in chest and left knee (he thought to be gout pain) and sleeping well. His voice is more upbeat and eyes brighter - he seems in better spirits and says he is. Continues to walk in halls and happy to be able to have liquids upgraded today as he is ready to eat and get more nutrition. Emotional support provided.   Vinie Sill, NP Palliative Medicine Team Pager # [phone] (M-F 8a-5p) Team Phone # [phone] (Nights/Weekends)

## 2015-01-01 ENCOUNTER — Ambulatory Visit (HOSPITAL_COMMUNITY): Payer: 59

## 2015-01-01 ENCOUNTER — Inpatient Hospital Stay (HOSPITAL_COMMUNITY): Payer: 59

## 2015-01-01 DIAGNOSIS — I313 Pericardial effusion (noninflammatory): Secondary | ICD-10-CM

## 2015-01-01 LAB — BASIC METABOLIC PANEL
Anion gap: 7 (ref 5–15)
BUN: 18 mg/dL (ref 6–20)
CO2: 28 mmol/L (ref 22–32)
Calcium: 8.2 mg/dL — ABNORMAL LOW (ref 8.9–10.3)
Chloride: 103 mmol/L (ref 101–111)
Creatinine, Ser: 1.22 mg/dL (ref 0.61–1.24)
GFR calc Af Amer: >60 mL/min (ref >60)
GFR calc non Af Amer: >60 mL/min (ref >60)
Glucose, Bld: 107 mg/dL — ABNORMAL HIGH (ref 65–99)
Potassium: 3.7 mmol/L (ref 3.5–5.1)
Sodium: 138 mmol/L (ref 135–145)

## 2015-01-01 LAB — PROTIME-INR
INR: 1.67 — AB (ref 0.00–1.49)
PROTHROMBIN TIME: 19.7 s — AB (ref 11.6–15.2)

## 2015-01-01 LAB — POCT I-STAT, CHEM 8
BUN: 14 mg/dL (ref 6–20)
CALCIUM ION: 1.15 mmol/L (ref 1.12–1.23)
Chloride: 106 mmol/L (ref 101–111)
Creatinine, Ser: 1 mg/dL (ref 0.61–1.24)
Glucose, Bld: 111 mg/dL — ABNORMAL HIGH (ref 65–99)
HEMATOCRIT: 27 % — AB (ref 39.0–52.0)
HEMOGLOBIN: 9.2 g/dL — AB (ref 13.0–17.0)
Potassium: 3.5 mmol/L (ref 3.5–5.1)
SODIUM: 140 mmol/L (ref 135–145)
TCO2: 22 mmol/L (ref 0–100)

## 2015-01-01 LAB — LACTATE DEHYDROGENASE: LDH: 247 U/L — AB (ref 98–192)

## 2015-01-01 LAB — CARBOXYHEMOGLOBIN
Carboxyhemoglobin: 1.9 % — ABNORMAL HIGH (ref 0.5–1.5)
Methemoglobin: 0.9 % (ref 0.0–1.5)
O2 Saturation: 55.6 %
Total hemoglobin: 8.3 g/dL — ABNORMAL LOW (ref 13.5–18.0)

## 2015-01-01 LAB — CBC
HCT: 25.9 % — ABNORMAL LOW (ref 39.0–52.0)
Hemoglobin: 8.2 g/dL — ABNORMAL LOW (ref 13.0–17.0)
MCH: 22.2 pg — ABNORMAL LOW (ref 26.0–34.0)
MCHC: 31.7 g/dL (ref 30.0–36.0)
MCV: 70 fL — ABNORMAL LOW (ref 78.0–100.0)
Platelets: 525 10*3/uL — ABNORMAL HIGH (ref 150–400)
RBC: 3.7 MIL/uL — ABNORMAL LOW (ref 4.22–5.81)
RDW: 21.6 % — ABNORMAL HIGH (ref 11.5–15.5)
WBC: 7.5 10*3/uL (ref 4.0–10.5)

## 2015-01-01 MED ORDER — WARFARIN SODIUM 1 MG PO TABS
1.0000 mg | ORAL_TABLET | Freq: Once | ORAL | Status: DC
  Filled 2015-01-01: qty 1

## 2015-01-01 MED ORDER — POTASSIUM CHLORIDE 10 MEQ/50ML IV SOLN
10.0000 meq | INTRAVENOUS | Status: AC
  Administered 2015-01-01 (×3): 10 meq via INTRAVENOUS

## 2015-01-01 MED ORDER — DIGOXIN 125 MCG PO TABS
0.1250 mg | ORAL_TABLET | Freq: Every day | ORAL | Status: DC
  Administered 2015-01-01 – 2015-01-08 (×8): 0.125 mg via ORAL
  Filled 2015-01-01 (×8): qty 1

## 2015-01-01 MED ORDER — SILDENAFIL CITRATE 20 MG PO TABS
20.0000 mg | ORAL_TABLET | Freq: Three times a day (TID) | ORAL | Status: DC
  Administered 2015-01-01 – 2015-01-08 (×23): 20 mg via ORAL
  Filled 2015-01-01 (×28): qty 1

## 2015-01-01 MED ORDER — POTASSIUM CHLORIDE 10 MEQ/50ML IV SOLN
10.0000 meq | INTRAVENOUS | Status: AC
  Administered 2015-01-01 (×3): 10 meq via INTRAVENOUS
  Filled 2015-01-01 (×3): qty 50

## 2015-01-01 MED ORDER — WARFARIN SODIUM 2 MG PO TABS
2.0000 mg | ORAL_TABLET | Freq: Once | ORAL | Status: AC
  Administered 2015-01-01: 2 mg via ORAL
  Filled 2015-01-01: qty 1

## 2015-01-01 MED ORDER — AMOXICILLIN-POT CLAVULANATE 875-125 MG PO TABS
1.0000 | ORAL_TABLET | Freq: Two times a day (BID) | ORAL | Status: DC
  Administered 2015-01-01 – 2015-01-03 (×6): 1 via ORAL
  Filled 2015-01-01 (×7): qty 1

## 2015-01-01 NOTE — Progress Notes (Signed)
Admitted 12/14/14 per Dr. Haroldine Laws due to acute/chonic systolic HF with cardiogenic shock.  HeartMate II LVAD implated on 12/20/14 by Dr. Cyndia Bent as destination therapy VAD.    Vital signs: Temp:  97.3 - 98.3 HR: 90's  AFib/Aflutter Auto cuff MAP:  88 - 96 O2 Sat: 95 - 100% RA Wt in lbs:  169 > 187> ... > 169> 169> 169> 167>166>166>169>173  Intra-op blood products: 4FFP 1 plt 1100 cell saver  ICU Blood Products:  12/27/14: 1 FFP (nosebleeds with INR > 4) 12/30/14: 2 units FFP (nosebleed with INR 4.28) 12/31/14: 1 unit RBC  Gtts: Milrinone 0.25 Amiodarone 30 mg/h - off 12/31/14 (changed to amiodarone 400 mg bid on 12/31/14)  Nitric Oxide: Off 12/23/14  Anticoagulants: ASA 325mg  started 12/21/14 Coumadin started 12/22/14  INR Goal 2 - 3  LVAD interrogation reveals:  Speed:  9200 Flow:  3.9 Power: 4.8 PI: 8.5 Alarms:  None Events:  5 PI events Fixed speed:  9200 Low speed limit:  8600  Drive Line:   Gauze dressing intact with attachment device accurately applied; being changed daily.   Arrhythmia:  12/20/14: AFlutter/AFib with RVR requiring multiple boluses and continuous infusion Amiodarone.   Labs:   Co-ox:  54 > ...> 54.9> 47.3 > 49 > 55.7 > 53.9 > 47.6 (on 0.125 Milrinone) > 55.6 (on Mil .25, 1 unit PC)  Hgb:  11.2 > 8.3> 8.7> 7.9 (Iron PO started)> 7.8> 8.4> 7.8 > 7.2 > 7.9 > 7.3 > 8.2  WBC:  8.5 > 9.5> 14.5> 19.1> ...>11.5> 12.0> 12.5> 12.6>10.3>9.0>7.3>7.5  LDH: 279> 220> 305 (peak)> 286> 254> 264> 25 6> 269 > 275 > 254 > 247  INR: ...> 2.52> 4.45> 4.23 (FFP for Nosebleeds)> 2.41> 2.73 > 3.64 > 4.28 > 2.56 > 1.67  Creatinine: 1.20 - 1.4 (baseline pre-op)... > 2.03> 2.0> 2.18> 1.33 > 1.17 > 1.25 > 1.35 > 1.22  Transfers:  12/26/14-->PTCU 2W25 12/27/14-->transferred back to ICU 2S12 for nosebleeds/hemoptysis   Plan/Recommendation as D/W Team:  1.  Ramp echo today at 12 noon with Dr. Haroldine Laws

## 2015-01-01 NOTE — Progress Notes (Signed)
*  PRELIMINARY RESULTS* Echocardiogram 2D Echocardiogram has been performed.  [name] 01/01/2015, 1:41 PM

## 2015-01-01 NOTE — Progress Notes (Signed)
Potassium 3.5 Dr. Prescott Gum notified, orders to give 3 runs.  Rowe Pavy, RN

## 2015-01-01 NOTE — Progress Notes (Signed)
OT Cancellation Note  Patient Details Name: [name] MRN: [id number] DOB: 04/15/1971   Cancelled Treatment:    Reason Eval/Treat Not Completed: Patient at procedure or test/ unavailable. Pt currently in ECHO for procedure, will try back later today or tomorrow as schedule allows.  [name] [id number] 01/01/2015, 1:19 PM

## 2015-01-01 NOTE — Progress Notes (Signed)
Patient ID: [name], male   DOB: [birth year], 44 y.o.   MRN: [id number]  HeartMate 2 Rounding Note POD #  12  Subjective:   preop acute on chronc HF, cardiogenic shock, IABP Postop: Extubated pod1, developed rapid afib-flutter refractory to amiodarone, digoxin- now much improved  Postop RV dysfunction requiring prolonged milrinone  infusion 0.25  Ramp ECHO performed today, images personally reviewed. RV appears to be only mildly depressed, tricuspid regurgitation is moderate. Speed adjusted appropriately-speed 9200 RPM  Postoperative nosebleed requiring nasal packing-now stable and packing removed today. We'll resume Coumadin 2 mg for INR of 1.7  Patient received 1 unit of packed cells yesterday for hemoglobin 7.3 and low carboxyhemoglobin saturation. Patient feels improved today with hemoglobin 8.5 and carboxyhemoglobin  saturation improved as well  LVAD function has been excellent, speed has been adjusted to 9200. Patient stronger walking and advancing diet  Patient has been ambulatory, all chest tubes have been removed and surgical incisions are healing. Transfer orders have been placed for the stepdown unit on 2 west  LVAD INTERROGATION:  HeartMate II LVAD:  Flow 4.1 liters/min, speed 9200, power 4.3, PI 6.8.    Objective:    Vital Signs:   Temp:  [97.3 F (36.3 C)-98.2 F (36.8 C)] 98.1 F (36.7 C) (09/13 1529) Resp:  [11-24] 21 (09/13 1600) BP: (91-114)/(65-94) 98/69 mmHg (09/13 1600) SpO2:  [95 %-100 %] 97 % (09/13 1600) Weight:  [173 lb 11.6 oz (78.8 kg)] 173 lb 11.6 oz (78.8 kg) (09/13 0640) Last BM Date: 01/01/15 Mean arterial Pressure 80's  Intake/Output:   Intake/Output Summary (Last 24 hours) at 01/01/15 1621 Last data filed at 01/01/15 1600  Gross per 24 hour  Intake 2931.2 ml  Output    700 ml  Net 2231.2 ml     Physical Exam: General:  Well appearing. No resp difficulty HEENT: normal-nasal packing in the right nostril-day #4 Cor: distant  heart sounds with LVAD hum present. Lungs: clear Abdomen: soft, nontender, nondistended. No hepatosplenomegaly. No bruits or masses. hypoactive bowel sounds. Extremities: no cyanosis, clubbing, rash, moderate edema Neuro: alert & orientedx3, cranial nerves grossly intact. moves all 4 extremities w/o difficulty. Affect pleasant  Telemetry: atrial fibrillation heart rate 110  Labs: Basic Metabolic Panel:  Recent Labs Lab 12/26/14 0517 12/27/14 0047  12/29/14 0800 12/29/14 1935 12/30/14 0413 12/31/14 0413 01/01/15 0405 01/01/15 1610  NA 134* 135  < > 141 138 140 140 138 140  K 3.4* 4.1  < > 3.2* 4.2 3.9 3.8 3.7 3.5  CL 91* 95*  < > 104 103 106 104 103 106  CO2 31 29  < > 27 26 26 30 28   --   GLUCOSE 115* 110*  < > 153* 90 105* 98 107* 111*  BUN 51* 46*  < > 23* 22* 20 23* 18 14  CREATININE 2.08* 1.61*  < > 1.17 1.25* 1.25* 1.35* 1.22 1.00  CALCIUM 8.9 8.9  < > 8.1* 8.5* 8.8* 8.6* 8.2*  --   MG 2.2 2.5*  --   --   --   --   --   --   --   PHOS 3.9 2.0*  --   --   --   --   --   --   --   < > = values in this interval not displayed.  Liver Function Tests:  Recent Labs Lab 12/26/14 0517 12/27/14 0047 12/28/14 0409  AST 23 31 31   ALT 16* 19 21  ALKPHOS 86 115 116  BILITOT 1.7* 1.4* 1.3*  PROT 6.7 6.9 6.5  ALBUMIN 2.6* 2.6* 2.3*   No results for input(s): LIPASE, AMYLASE in the last 168 hours. No results for input(s): AMMONIA in the last 168 hours.  CBC:  Recent Labs Lab 12/26/14 0517 12/27/14 0047  12/28/14 0410 12/29/14 0800 12/30/14 0413 12/31/14 0413 01/01/15 0405 01/01/15 1610  WBC 9.5 11.5*  < > 12.6* 10.3 9.0 7.3 7.5  --   NEUTROABS 6.6 8.0*  --   --   --   --   --   --   --   HGB 8.3* 9.0*  < > 7.8* 7.2* 7.9* 7.3* 8.2* 9.2*  HCT 25.5* 27.5*  < > 24.6* 23.1* 25.1* 23.9* 25.9* 27.0*  MCV 62.7* 64.6*  < > 66.0* 66.4* 67.1* 66.2* 70.0*  --   PLT 365 435*  < > 523* 600* 613* 619* 525*  --   < > = values in this interval not displayed.  INR:  Recent  Labs Lab 12/28/14 0409 12/29/14 0350 12/30/14 0413 12/31/14 0413 01/01/15 0405  INR 2.73* 3.64* 4.28* 2.56* 1.67*    Other results:  EKG:   Imaging: Dg Chest Port 1 View  01/01/2015   CLINICAL DATA:  Left ventricular assist device.  EXAM: PORTABLE CHEST - 1 VIEW  COMPARISON:  12/28/2014.  FINDINGS: Cardiac pacer left ventricular assist device in stable position. Prior median sternotomy. Cardiomegaly with normal pulmonary vascularity. Left lower lobe atelectasis and/or infiltrate with small left pleural effusion. No pneumothorax.  IMPRESSION: 1. Cardiac pacer and left ventricular assist device stable position. Right PICC line in stable position. Prior median sternotomy. Stable cardiomegaly. 2. Left lower lobe atelectasis and/or infiltrate with small left pleural effusion.   Electronically Signed   By: Marcello Moores  Register   On: 01/01/2015 08:03     Medications:     Scheduled Medications: . allopurinol  100 mg Oral Daily  . amiodarone  400 mg Oral BID  . amoxicillin-clavulanate  1 tablet Oral Q12H  . antiseptic oral rinse  7 mL Mouth Rinse QID  . bisacodyl  10 mg Oral Daily   Or  . bisacodyl  10 mg Rectal Daily  . citalopram  20 mg Oral Daily  . digoxin  0.125 mg Oral Daily  . feeding supplement (ENSURE ENLIVE)  237 mL Oral TID BM  . metoCLOPramide (REGLAN) injection  10 mg Intravenous 4 times per day  . pantoprazole  40 mg Oral Daily  . potassium chloride  10 mEq Intravenous Q1 Hr x 3  . sildenafil  20 mg Oral TID  . sodium chloride  10-40 mL Intracatheter Q12H  . sodium chloride  3 mL Intravenous Q12H  . warfarin  2 mg Oral ONCE-1800  . Warfarin - Pharmacist Dosing Inpatient   Does not apply q1800    Infusions: . sodium chloride 10 mL/hr at 01/01/15 1600  . sodium chloride 250 mL (01/01/15 1600)  . lactated ringers Stopped (12/20/14 2000)  . lactated ringers Stopped (12/22/14 0600)  . milrinone 0.25 mcg/kg/min (01/01/15 1600)    PRN Medications: hydrALAZINE,  lidocaine, lidocaine, lidocaine-EPINEPHrine, morphine injection, ondansetron (ZOFRAN) IV, oxyCODONE, oxymetazoline, silver nitrate applicators, sodium chloride, sodium chloride, sodium chloride, traMADol, TRIPLE ANTIBIOTIC   Assessment:   1. Acute/chonic systolic HF , NICM EF 0000000 with Cardiogenic shock on milrinone and levophed, IABP preoop, s/p HMII implant 12/20/2014 2. Chronic atrial fib 3. Postop atrial flutter with RVR 4. Expected EBL anemia-now worsened with recent nosebleeds  5. Fever early postop- resolved 6  postop ileus, now resolved   Plan/Discussion:    He is hemodynamically  stable. VAD flow has improved with inc speed . HR much better with time on by mouth amiodarone and digoxin Antibiotics converted to oral Augmentin Ready for transfer to stepdown Cannula position appears excellent on today's echo Patient feeling better after transfusion 1 unit of packed cells   With hemoglobin greater than 8.5 will not need anymore for now  Coumadin 2 mg ordered for today-will be dosed by heart failure pharmacy team     I reviewed the LVAD parameters from today, and compared the results to the patient's prior recorded data.  No programming changes were made.  The LVAD is functioning within specified parameters.  The patient performs LVAD self-test daily.  LVAD interrogation was negative for any significant power changes, alarms or PI events/speed drops.  LVAD equipment check completed and is in good working order.  Back-up equipment present.   LVAD education done on emergency procedures and precautions and reviewed exit site care.  Length of Stay: McBride III 01/01/2015, 4:21 PM

## 2015-01-01 NOTE — Progress Notes (Signed)
Patient ID: [name], male   DOB: [birth year], 44 y.o.   MRN: [id number]           VAD Team Rounding Note  Subjective:    s/p HMII implant 9/1  9/6 Milrinone cut back to 0.125 mcg.  VAD was increased to 9200.  9/7 Continued on milrinone 0.125 mcg. INR 4.4 Transferred to 2W 9/8 Developed epitaxis. Coughing up clots. INR 4.23 Given FFP.  CXR ok  9/11 INR 4.28 Received 2 FFP 9/12 INR today 2.56 Hgb 7.3 received 1UPRBCs. Amio transitioned to po. CO-OX 47%. Milrinone increased to 0.25 mcg.  9/13 INR 1.6 CO-OX 56% on milrinone 0.25 mcg.   Has had trouble with epistaxis, seen by ENT and right nostril packed.  Still with some L nare oozing this morning when standing.   Tolerating full liquids. Denies SOB/Orthopnea.   LVAD INTERROGATION:  HeartMate II LVAD:  Flow 3.7 liters/min, speed 9190, power 4.4,  PI 8.43.  A couple  PI events over night. Had one PI up to 9.6. Also had speed drop to 8800.    Objective:    Vital Signs:   Temp:  [97.3 F (36.3 C)-98.3 F (36.8 C)] 97.9 F (36.6 C) (09/13 0724) Resp:  [11-26] 14 (09/13 0700) BP: (94-114)/(65-94) 110/91 mmHg (09/13 0600) SpO2:  [95 %-100 %] 98 % (09/13 0700) Weight:  [173 lb 11.6 oz (78.8 kg)] 173 lb 11.6 oz (78.8 kg) (09/13 0640) Last BM Date: 12/31/14 Mean arterial Pressure 80-90s  Intake/Output:   Intake/Output Summary (Last 24 hours) at 01/01/15 0729 Last data filed at 01/01/15 0716  Gross per 24 hour  Intake 2989.4 ml  Output    752 ml  Net 2237.4 ml     Physical Exam: CVP ~8  General: Sitting in chair. NAD  HEENT: Right nasal packing.  Neck: supple. JVP 8-9 cm Cor Sternal dressing in place.  Mechanical heart sounds with LVAD hum present. Lungs: Decreased in the bases.  Abdomen: soft, nontender, nondistended. hypoactive bowel sounds. Driveline: C/D/I; securement device intact and driveline incorporated Extremities: no cyanosis, clubbing, rash, no edema.  Neuro: awake conversant Flat affect  GU: Foley    Telemetry: AF 80-90s   Labs: Basic Metabolic Panel:  Recent Labs Lab 12/26/14 0517 12/27/14 0047  12/29/14 0800 12/29/14 1935 12/30/14 0413 12/31/14 0413 01/01/15 0405  NA 134* 135  < > 141 138 140 140 138  K 3.4* 4.1  < > 3.2* 4.2 3.9 3.8 3.7  CL 91* 95*  < > 104 103 106 104 103  CO2 31 29  < > 27 26 26 30 28   GLUCOSE 115* 110*  < > 153* 90 105* 98 107*  BUN 51* 46*  < > 23* 22* 20 23* 18  CREATININE 2.08* 1.61*  < > 1.17 1.25* 1.25* 1.35* 1.22  CALCIUM 8.9 8.9  < > 8.1* 8.5* 8.8* 8.6* 8.2*  MG 2.2 2.5*  --   --   --   --   --   --   PHOS 3.9 2.0*  --   --   --   --   --   --   < > = values in this interval not displayed.  Liver Function Tests:  Recent Labs Lab 12/26/14 0517 12/27/14 0047 12/28/14 0409  AST 23 31 31   ALT 16* 19 21  ALKPHOS 86 115 116  BILITOT 1.7* 1.4* 1.3*  PROT 6.7 6.9 6.5  ALBUMIN 2.6* 2.6* 2.3*   No results for input(s):  LIPASE, AMYLASE in the last 168 hours. No results for input(s): AMMONIA in the last 168 hours.  CBC:  Recent Labs Lab 12/26/14 0517 12/27/14 0047  12/28/14 0410 12/29/14 0800 12/30/14 0413 12/31/14 0413 01/01/15 0405  WBC 9.5 11.5*  < > 12.6* 10.3 9.0 7.3 7.5  NEUTROABS 6.6 8.0*  --   --   --   --   --   --   HGB 8.3* 9.0*  < > 7.8* 7.2* 7.9* 7.3* 8.2*  HCT 25.5* 27.5*  < > 24.6* 23.1* 25.1* 23.9* 25.9*  MCV 62.7* 64.6*  < > 66.0* 66.4* 67.1* 66.2* 70.0*  PLT 365 435*  < > 523* 600* 613* 619* 525*  < > = values in this interval not displayed.  INR:  Recent Labs Lab 12/28/14 0409 12/29/14 0350 12/30/14 0413 12/31/14 0413 01/01/15 0405  INR 2.73* 3.64* 4.28* 2.56* 1.67*    Other results:   Imaging: No results found.   Medications:     Scheduled Medications: . allopurinol  100 mg Oral Daily  . amiodarone  400 mg Oral BID  . antiseptic oral rinse  7 mL Mouth Rinse QID  . bisacodyl  10 mg Oral Daily   Or  . bisacodyl  10 mg Rectal Daily  . citalopram  20 mg Oral Daily  . digoxin  0.125 mg  Intravenous Daily  . feeding supplement (ENSURE ENLIVE)  237 mL Oral TID BM  . metoCLOPramide (REGLAN) injection  10 mg Intravenous 4 times per day  . pantoprazole  40 mg Oral Daily  . piperacillin-tazobactam (ZOSYN)  IV  3.375 g Intravenous Q8H  . potassium chloride  10 mEq Intravenous Q1 Hr x 3  . sodium chloride  10-40 mL Intracatheter Q12H  . sodium chloride  3 mL Intravenous Q12H  . Warfarin - Pharmacist Dosing Inpatient   Does not apply q1800    Infusions: . sodium chloride 10 mL/hr at 01/01/15 0400  . sodium chloride 250 mL (01/01/15 0400)  . lactated ringers Stopped (12/20/14 2000)  . lactated ringers Stopped (12/22/14 0600)  . milrinone 0.25 mcg/kg/min (01/01/15 0400)    PRN Medications: hydrALAZINE, lidocaine, lidocaine, lidocaine-EPINEPHrine, morphine injection, ondansetron (ZOFRAN) IV, oxyCODONE, oxymetazoline, silver nitrate applicators, sodium chloride, sodium chloride, sodium chloride, traMADol, TRIPLE ANTIBIOTIC   Assessment:   1. Cardiogenic shock s/p HMII implant 12/20/2014 2. Acute/chonic systolic HF   -- NICM EF 0000000 3. Chronic AF with RVR 4. Expected EBL anemia 5. Acute on chronic renal failure 6. PNA  7. Anemia  8. Epistaxis: right nasal packing.  9. Hemoptysis 10. Ileus: Resolving  Plan/Discussion:   S/P HMII LVAD implant on 9/1   Epistaxis, right nostril now packed.  ENT recommends leaving packing in for 5-7 days.  Still with mild oozing L nare. INR today 1.67 . Complete zosyn  Received  2 FFP 9/11-->INR 4.28>2.56 >1.67   .     Anemia  9/12 Hemoglobin 7.3 received 1UPRBCs. Todays hemoglobin is 8.2. May need another unit if drops again.   Volume status ok. CVP 8. CO-OX improved to 56%. Continue milrinone 0.25 mcg.   Maps running high 88-96. Add 2.5 mg lisinopril daily     9/8 echo appeared to show the IV septum bowing some towards the left, but he has had few PI events.  Continue LVAD speed at this point.    AF rate 90s-100s. On po  amiodarone.   Ileus: improving on full liquid diet.     Length of Stay: 18  CLEGG,AMY NP-C  01/01/2015 7:29 AM  Patient seen and examined with Darrick Grinder, NP. We discussed all aspects of the encounter. I agree with the assessment and plan as stated above.   Overall continues to improve steadily. Ambulating. Tolerating full diet. Co-ox improved with 1u RBCs. Continue milrinone. Ramp study persoanlly performed at bedside. Speed left at 9200. Agree with addition of lisinopril for HTN. Can got to 2W.    Meah Jiron,MD 9:50 PM

## 2015-01-01 NOTE — Progress Notes (Signed)
Speed  Flow  PI  Power  LVIDD  AI  AoV Opens  MR  TR  Septum  RV   9000  3.7 8.7 4.4 5.10 cm trivail 2/5 mild severe Bow to right   9200  3.9 8.5 4.8 5.18  trivial 1/5 mild severe Midline Mild HK  9400  3.7 8.5 5.0 4.54  trivail 1/8  trace severe Bounce to left   9600  4.0 7.7 5.3 4.77 trivial 0/5 trace severe Midline;  bounce to left    Doppler MAP: 91/76 (79)    Ramp ECHO performed at bedside per Dr. Haroldine Laws.   Small to moderate posterior pericardial effusion noted on echo.   At completion of ramp study, patients primary and back up controller programmed:  Fixed speed: 9200 Low speed limit:  8600  Will start Revatio 20 mg tid today--estimated PA pressures of 50 mmHg on echo today, dilated RV and severe TR.

## 2015-01-01 NOTE — Social Work (Signed)
CSW met with patient at bedside. Patient reports he is feeling better and ready for a VAD visitor. CSW arranged for Fenton Malling VAD patient to visit with patient to provide support and encouragement for recovery. Patient appeared in good spirits today and positive outlook for recovery. CSW will continue to follow throughout recovery. Raquel Sarna, [place]

## 2015-01-01 NOTE — Progress Notes (Signed)
ANTICOAGULATION CONSULT NOTE - Follow Up Consult  Pharmacy Consult for Coumadin Indication: afib/LVAD  No Known Allergies  Patient Measurements: Height: 5\' 7"  (170.2 cm) Weight: 173 lb 11.6 oz (78.8 kg) IBW/kg (Calculated) : 66.1  Vital Signs: Temp: 97.9 F (36.6 C) (09/13 0724) Temp Source: Oral (09/13 0724) BP: 102/89 mmHg (09/13 0800)  Labs:  Recent Labs  12/30/14 0413 12/31/14 0413 01/01/15 0405  HGB 7.9* 7.3* 8.2*  HCT 25.1* 23.9* 25.9*  PLT 613* 619* 525*  LABPROT 40.0* 27.2* 19.7*  INR 4.28* 2.56* 1.67*  CREATININE 1.25* 1.35* 1.22    Estimated Creatinine Clearance: 70.7 mL/min (by C-G formula based on Cr of 1.22).  Assessment: [name] on eliquis pta for afib/hx DVT, now transitioned to coumadin post LVAD. He received 4 doses and INR trended up to 4.45. He also developed epistaxis. Coumadin held and he received FFP on 9/8.  INR initially trended down to 2.4 but then trended back up to 4.28 now back down to 1.6 after FFP/PRBC. Still with some oozing. Hgb improved to 8.2 after PRBC given yesterday. D/w surgery and will resume warfarin tonight at low dose of 2mg  tonight.  Goal of Therapy:  INR 2-3 Monitor platelets by anticoagulation protocol: Yes   Plan:  1) Warfarin 2mg  per MD today 2) Follow daily INR  Erin Hearing PharmD., BCPS Clinical Pharmacist Pager [phone] 01/01/2015 10:34 AM

## 2015-01-02 LAB — CBC
HCT: 25.8 % — ABNORMAL LOW (ref 39.0–52.0)
Hemoglobin: 7.9 g/dL — ABNORMAL LOW (ref 13.0–17.0)
MCH: 21.2 pg — ABNORMAL LOW (ref 26.0–34.0)
MCHC: 30.6 g/dL (ref 30.0–36.0)
MCV: 69.2 fL — ABNORMAL LOW (ref 78.0–100.0)
Platelets: 525 10*3/uL — ABNORMAL HIGH (ref 150–400)
RBC: 3.73 MIL/uL — ABNORMAL LOW (ref 4.22–5.81)
RDW: 22.1 % — ABNORMAL HIGH (ref 11.5–15.5)
WBC: 9.1 10*3/uL (ref 4.0–10.5)

## 2015-01-02 LAB — CARBOXYHEMOGLOBIN
CARBOXYHEMOGLOBIN: 1.7 % — AB (ref 0.5–1.5)
Methemoglobin: 0.8 % (ref 0.0–1.5)
O2 Saturation: 53.6 %
Total hemoglobin: 8 g/dL — ABNORMAL LOW (ref 13.5–18.0)

## 2015-01-02 LAB — BASIC METABOLIC PANEL
Anion gap: 5 (ref 5–15)
BUN: 14 mg/dL (ref 6–20)
CO2: 26 mmol/L (ref 22–32)
Calcium: 8.3 mg/dL — ABNORMAL LOW (ref 8.9–10.3)
Chloride: 105 mmol/L (ref 101–111)
Creatinine, Ser: 1.15 mg/dL (ref 0.61–1.24)
GFR calc Af Amer: >60 mL/min (ref >60)
GFR calc non Af Amer: >60 mL/min (ref >60)
Glucose, Bld: 95 mg/dL (ref 65–99)
Potassium: 4.3 mmol/L (ref 3.5–5.1)
Sodium: 136 mmol/L (ref 135–145)

## 2015-01-02 LAB — LACTATE DEHYDROGENASE: LDH: 244 U/L — AB (ref 98–192)

## 2015-01-02 LAB — PROTIME-INR
INR: 1.58 — AB (ref 0.00–1.49)
PROTHROMBIN TIME: 18.9 s — AB (ref 11.6–15.2)

## 2015-01-02 MED ORDER — TORSEMIDE 20 MG PO TABS
20.0000 mg | ORAL_TABLET | Freq: Every day | ORAL | Status: DC
  Administered 2015-01-02 – 2015-01-03 (×2): 20 mg via ORAL
  Filled 2015-01-02 (×2): qty 1

## 2015-01-02 MED ORDER — AMIODARONE HCL 200 MG PO TABS
200.0000 mg | ORAL_TABLET | Freq: Two times a day (BID) | ORAL | Status: DC
  Administered 2015-01-02 – 2015-01-08 (×13): 200 mg via ORAL
  Filled 2015-01-02 (×12): qty 1

## 2015-01-02 MED ORDER — WARFARIN SODIUM 2 MG PO TABS
2.0000 mg | ORAL_TABLET | Freq: Once | ORAL | Status: AC
  Administered 2015-01-02: 2 mg via ORAL
  Filled 2015-01-02: qty 2
  Filled 2015-01-02: qty 1

## 2015-01-02 MED ORDER — ZOLPIDEM TARTRATE 5 MG PO TABS
5.0000 mg | ORAL_TABLET | Freq: Every evening | ORAL | Status: DC | PRN
Start: 2015-01-02 — End: 2015-01-08
  Administered 2015-01-02 – 2015-01-06 (×4): 5 mg via ORAL
  Filled 2015-01-02 (×4): qty 1

## 2015-01-02 NOTE — Progress Notes (Signed)
Admitted 12/14/14 per Dr. Haroldine Laws due to acute/chonic systolic HF with cardiogenic shock.  HeartMate II LVAD implated on 12/20/14 by Dr. Cyndia Bent as destination therapy VAD.    Vital signs: Temp:  97.9 - 98.3 HR: 84 - 91  AFib/Aflutter MAP:  74 - 89 O2 Sat: 95 - 100% RA Wt in lbs:  169 > 187> ... > 169> 169> 169> 167>166>166>169>173>175  Intra-op blood products: 4FFP 1 plt 1100 cell saver  ICU Blood Products:  12/27/14: 1 FFP (nosebleeds with INR > 4) 12/30/14: 2 units FFP (nosebleed with INR 4.28) 12/31/14: 1 unit RBC  Gtts: Milrinone 0.25 (increased from .125 on 01/01/15) Amiodarone 30 mg/h - off 12/31/14 (changed to amiodarone 400 mg bid on 12/31/14). Decreased to 200 bid on 01/02/15  Sildenafil started 01/01/15 after ramp echo   Nitric Oxide: Off 12/23/14  Anticoagulants: ASA 325mg  started 12/21/14 Coumadin started 12/22/14  INR Goal 2 - 3  LVAD interrogation reveals:  Speed:  9200 Flow:  4.2 Power: 5.0 PI:  8.1 Alarms:  None Events:  2 PI events Fixed speed:  9200 Low speed limit:  8600  Drive Line:   Gauze dressing intact with attachment device accurately applied; being changed daily.   Arrhythmia:  12/20/14: AFlutter/AFib with RVR requiring multiple boluses and continuous infusion Amiodarone.   Labs:   Co-ox:  54 > ...> 54.9> 47.3 > 49 > 55.7 > 53.9 > 47.6 (on 0.125 Milrinone) > 55.6 (on Mil .25, 1 unit PC) > 53.0  Hgb:  11.2 > 8.3> 8.7> 7.9 (Iron PO started)> 7.8> 8.4> 7.8 > 7.2 > 7.9 > 7.3 > 8.2 > 7.9  WBC:  8.5 > 9.5> 14.5> 19.1> ...>11.5> 12.0> 12.5> 12.6>10.3>9.0>7.3>7.5>9.1  LDH: 279> 220> 305 (peak)> 286> 254> 264> 25 6> 269 > 275 > 254 > 247 > 244  INR: ...> 2.52> 4.45> 4.23 (FFP for Nosebleeds)> 2.41> 2.73 > 3.64 > 4.28 > 2.56 > 1.67 > 1.58  Creatinine: 1.20 - 1.4 (baseline pre-op)... > 2.03> 2.0> 2.18> 1.33 > 1.17 > 1.25 > 1.35 > 1.22  Transfers:  12/26/14-->PTCU 2W25 12/27/14-->transferred back to ICU 2S12 for nosebleeds/hemoptysis  01/01/15-->transferred  to 2W24  Plan/Recommendation as D/W Team:  1   Continue discharge teaching with patient

## 2015-01-02 NOTE — Progress Notes (Signed)
Occupational Therapy Treatment Patient Details Name: [name] MRN: [id number] DOB: [birth year] Today's Date: 01/02/2015    History of present illness Patient is a 44 yo male admitted 12/14/14 with heart failure. Patient s/p LVAD on 12/20/14. 9/8 developed bil nose bleed (anticoagulated) and required transfer back to ICU PMH: NICM, EF 20-25%, CHF, ICD, Afib, HTN, severe MR/TR, RA   OT comments  Pt is able to perform ADLs with min guard assist to min A.  He demonstrates good understanding of precautions, and was able to switch battery <> power supply with supervision.   He requires min A for bed mobility and will need further practice to increase independence with this.   Follow Up Recommendations  Home health OT    Equipment Recommendations  3 in 1 bedside comode;Tub/shower seat    Recommendations for Other Services      Precautions / Restrictions Precautions Precautions: Sternal;Fall Precaution Comments: LVAD;          Mobility Bed Mobility Overal bed mobility: Needs Assistance Bed Mobility: Rolling;Sidelying to Sit;Sit to Supine Rolling: Supervision Sidelying to sit: Min assist   Sit to supine: Min guard   General bed mobility comments: pt requires assist to lift torso from bed   Transfers Overall transfer level: Needs assistance Equipment used: Rolling walker (2 wheeled) Transfers: Sit to/from [facility] Sit to Stand: Min guard Stand pivot transfers: Min guard            Balance Overall balance assessment: Needs assistance Sitting-balance support: Feet supported Sitting balance-Leahy Scale: Good       Standing balance-Leahy Scale: Fair                     ADL Overall ADL's : Needs assistance/impaired     Grooming: Wash/dry hands;Wash/dry face;Oral care;Brushing hair;Min guard;Standing       Lower Body Bathing: Min guard;Sit to/from stand   Upper Body Dressing : Supervision/safety;Sitting   Lower Body Dressing: Min  guard;Sit to/from stand   Toilet Transfer: Min guard;Ambulation;Comfort height toilet;RW   Toileting- Water quality scientist and Hygiene: Min guard;Sit to/from stand       Functional mobility during ADLs: Min guard;Rolling walker General ADL Comments: Pt able to don/doff socks, apply lotion to leg without assist.  He was able to swith from battery <> power supply with supervision.  Required one cue to disconnect battery from clip.  He deomonstrates good understanding of precautions and demonstrates safety managing drive line and equipment       Vision                     Perception     Praxis      Cognition   Behavior During Therapy: Flat affect Overall Cognitive Status: Within Functional Limits for tasks assessed                       Extremity/Trunk Assessment               Exercises     Shoulder Instructions       General Comments      Pertinent Vitals/ Pain       Pain Assessment: Faces Faces Pain Scale: Hurts little more Pain Location: chest/surgical site (with sit to supine) Pain Descriptors / Indicators: Grimacing Pain Intervention(s): Monitored during session  Home Living  Prior Functioning/Environment              Frequency Min 3X/week     Progress Toward Goals  OT Goals(current goals can now be found in the care plan section)  Progress towards OT goals: Progressing toward goals     Plan Discharge plan remains appropriate    Co-evaluation                 End of Session Equipment Utilized During Treatment: Rolling walker   Activity Tolerance Patient tolerated treatment well   Patient Left in bed;with call bell/phone within reach   Nurse Communication Mobility status        Time: [id number] OT Time Calculation (min): 28 min  Charges: OT General Charges $OT Visit: 1 Procedure OT Treatments $Self Care/Home Management : 8-22 mins $Therapeutic  Activity: 8-22 mins  Surina Storts M 01/02/2015, 4:41 PM

## 2015-01-02 NOTE — Progress Notes (Signed)
CARDIAC REHAB PHASE I   PRE:  Rate/Rhythm: 94 afib    BP: sitting 92    SaO2: 97 RA  MODE:  Ambulation: 350 ft   POST:  Rate/Rhythm: 100 afib    BP: sitting 104     SaO2: 96-97 RA  Pt has walked x2 already today but willing to walk again. Sts he is tired, not much sleep last night. Able to go to batteries completely independently (even getting batteries in vest). Steady with rollator which he liked using as it glides better. Does c/o upper left chest pain with movement. Return to bed.  [place], [place], ACSM 01/02/2015 2:03 PM

## 2015-01-02 NOTE — Progress Notes (Signed)
ANTICOAGULATION CONSULT NOTE - Follow Up Consult  Pharmacy Consult for Coumadin Indication: afib/LVAD  No Known Allergies  Patient Measurements: Height: 5\' 7"  (170.2 cm) Weight: 175 lb 1.6 oz (79.425 kg) IBW/kg (Calculated) : 66.1  Vital Signs: Temp: 98.3 F (36.8 C) (09/14 0425) Temp Source: Oral (09/14 0425) BP: 105/85 mmHg (09/14 0425) Pulse Rate: 84 (09/14 0425)  Labs:  Recent Labs  12/31/14 0413 01/01/15 0405 01/01/15 1610 01/02/15 0453  HGB 7.3* 8.2* 9.2* 7.9*  HCT 23.9* 25.9* 27.0* 25.8*  PLT 619* 525*  --  525*  LABPROT 27.2* 19.7*  --  18.9*  INR 2.56* 1.67*  --  1.58*  CREATININE 1.35* 1.22 1.00 1.15    Estimated Creatinine Clearance: 81.1 mL/min (by C-G formula based on Cr of 1.15).  Assessment: 44yom on eliquis pta for afib/hx DVT, now transitioned to coumadin post LVAD. He received 4 doses and INR trended up to 4.45. He also developed epistaxis. Coumadin held and he received FFP on 9/8.  INR initially trended down to 2.4 but then trended back up to 4.28 now back down to 1.58, restarted warfarin 9/13.   No bleeding overnight, hgb down this am to 7.9. Decreasing amio to 200mg  bid. Will repeat low dose tonight, if INR does not respond and bleeding resolved will consider increasing dose.  Goal of Therapy:  INR 2-3 Monitor platelets by anticoagulation protocol: Yes   Plan:  1) Repeat Warfarin 2mg  today 2) Follow daily INR  Erin Hearing PharmD., BCPS Clinical Pharmacist Pager [phone] 01/02/2015 10:43 AM

## 2015-01-02 NOTE — Progress Notes (Signed)
Patient ID: [name], male   DOB: 21-Jun-1970, 44 y.o.   MRN: [id number]           VAD Team Rounding Note  Subjective:    s/p HMII implant 9/1  9/6 Milrinone cut back to 0.125 mcg.  VAD was increased to 9200.  9/7 Continued on milrinone 0.125 mcg. INR 4.4 Transferred to 2W 9/8 Developed epitaxis. Coughing up clots. INR 4.23 Given FFP.  CXR ok ENT consulted.  9/11 INR 4.28 Received 2 FFP 9/12 INR today 2.56 Hgb 7.3 received 1UPRBCs. Amio transitioned to po. CO-OX 47%. Milrinone increased to 0.25 mcg.  9/13 INR 1.6 CO-OX 56% on milrinone 0.25 mcg. Transferred to 2W 9/13 Ramp echo speed maintained at 9200  No bleeding over night. Denies SOB/Orthopnea. Having difficulty sleeping. Milrinone decreased to 0.25 yesterday. Revatio started. Co-ox 54%  LVAD INTERROGATION:  HeartMate II LVAD:  Flow 3.6 liters/min, speed 9170, power 4.8,  PI 8.2 NO PI events over niight.   A couple  PI events over night.    Objective:    Vital Signs:   Temp:  [97.9 F (36.6 C)-98.3 F (36.8 C)] 98.3 F (36.8 C) (09/14 0425) Pulse Rate:  [84-91] 84 (09/14 0425) Resp:  [14-24] 18 (09/14 0425) BP: (91-107)/(67-89) 105/85 mmHg (09/14 0425) SpO2:  [95 %-100 %] 99 % (09/14 0425) Weight:  [175 lb 1.6 oz (79.425 kg)] 175 lb 1.6 oz (79.425 kg) (09/14 0425) Last BM Date: 01/02/15 Mean arterial Pressure 70-80s  Intake/Output:   Intake/Output Summary (Last 24 hours) at 01/02/15 0641 Last data filed at 01/01/15 2354  Gross per 24 hour  Intake   1490 ml  Output    500 ml  Net    990 ml     Physical Exam:  General: In bed.  NAD  HEENT: normal .  Neck: supple. JVP 9-10 cm Cor: Sternal incision approximated.   Mechanical heart sounds with LVAD hum present. Lungs: Clear   Abdomen: soft, nontender, nondistended. + bowel sounds. Driveline: C/D/I; securement device intact and driveline incorporated Extremities: no cyanosis, clubbing, rash, no edema.  Neuro: awake conversant Flat affect    Telemetry: AF  80-90s   Labs: Basic Metabolic Panel:  Recent Labs Lab 12/27/14 0047  12/29/14 1935 12/30/14 0413 12/31/14 0413 01/01/15 0405 01/01/15 1610 01/02/15 0453  NA 135  < > 138 140 140 138 140 136  K 4.1  < > 4.2 3.9 3.8 3.7 3.5 4.3  CL 95*  < > 103 106 104 103 106 105  CO2 29  < > 26 26 30 28   --  26  GLUCOSE 110*  < > 90 105* 98 107* 111* 95  BUN 46*  < > 22* 20 23* 18 14 14   CREATININE 1.61*  < > 1.25* 1.25* 1.35* 1.22 1.00 1.15  CALCIUM 8.9  < > 8.5* 8.8* 8.6* 8.2*  --  8.3*  MG 2.5*  --   --   --   --   --   --   --   PHOS 2.0*  --   --   --   --   --   --   --   < > = values in this interval not displayed.  Liver Function Tests:  Recent Labs Lab 12/27/14 0047 12/28/14 0409  AST 31 31  ALT 19 21  ALKPHOS 115 116  BILITOT 1.4* 1.3*  PROT 6.9 6.5  ALBUMIN 2.6* 2.3*   No results for input(s): LIPASE, AMYLASE in  the last 168 hours. No results for input(s): AMMONIA in the last 168 hours.  CBC:  Recent Labs Lab 12/27/14 0047  12/29/14 0800 12/30/14 0413 12/31/14 0413 01/01/15 0405 01/01/15 1610 01/02/15 0453  WBC 11.5*  < > 10.3 9.0 7.3 7.5  --  9.1  NEUTROABS 8.0*  --   --   --   --   --   --   --   HGB 9.0*  < > 7.2* 7.9* 7.3* 8.2* 9.2* 7.9*  HCT 27.5*  < > 23.1* 25.1* 23.9* 25.9* 27.0* 25.8*  MCV 64.6*  < > 66.4* 67.1* 66.2* 70.0*  --  69.2*  PLT 435*  < > 600* 613* 619* 525*  --  525*  < > = values in this interval not displayed.  INR:  Recent Labs Lab 12/29/14 0350 12/30/14 0413 12/31/14 0413 01/01/15 0405 01/02/15 0453  INR 3.64* 4.28* 2.56* 1.67* 1.58*    Other results:   Imaging: Dg Chest Port 1 View  01/01/2015   CLINICAL DATA:  Left ventricular assist device.  EXAM: PORTABLE CHEST - 1 VIEW  COMPARISON:  12/28/2014.  FINDINGS: Cardiac pacer left ventricular assist device in stable position. Prior median sternotomy. Cardiomegaly with normal pulmonary vascularity. Left lower lobe atelectasis and/or infiltrate with small left pleural  effusion. No pneumothorax.  IMPRESSION: 1. Cardiac pacer and left ventricular assist device stable position. Right PICC line in stable position. Prior median sternotomy. Stable cardiomegaly. 2. Left lower lobe atelectasis and/or infiltrate with small left pleural effusion.   Electronically Signed   By: Marcello Moores  Register   On: 01/01/2015 08:03     Medications:     Scheduled Medications: . allopurinol  100 mg Oral Daily  . amiodarone  400 mg Oral BID  . amoxicillin-clavulanate  1 tablet Oral Q12H  . antiseptic oral rinse  7 mL Mouth Rinse QID  . bisacodyl  10 mg Oral Daily   Or  . bisacodyl  10 mg Rectal Daily  . citalopram  20 mg Oral Daily  . digoxin  0.125 mg Oral Daily  . feeding supplement (ENSURE ENLIVE)  237 mL Oral TID BM  . metoCLOPramide (REGLAN) injection  10 mg Intravenous 4 times per day  . pantoprazole  40 mg Oral Daily  . sildenafil  20 mg Oral TID  . sodium chloride  10-40 mL Intracatheter Q12H  . sodium chloride  3 mL Intravenous Q12H  . Warfarin - Pharmacist Dosing Inpatient   Does not apply q1800    Infusions: . sodium chloride 10 mL/hr at 01/01/15 1600  . sodium chloride 250 mL (01/01/15 1600)  . lactated ringers Stopped (12/20/14 2000)  . lactated ringers Stopped (12/22/14 0600)  . milrinone 0.25 mcg/kg/min (01/01/15 1626)    PRN Medications: hydrALAZINE, lidocaine, lidocaine, lidocaine-EPINEPHrine, morphine injection, ondansetron (ZOFRAN) IV, oxyCODONE, oxymetazoline, silver nitrate applicators, sodium chloride, sodium chloride, sodium chloride, traMADol, TRIPLE ANTIBIOTIC   Assessment:   1. Cardiogenic shock s/p HMII implant 12/20/2014 2. Acute/chonic systolic HF   -- NICM EF 0000000 3. Chronic AF with RVR 4. Expected EBL anemia 5. Acute on chronic renal failure 6. PNA  7. Anemia  8. Epistaxis: right nasal packing.  9. Hemoptysis 10. Ileus: Resolving  Plan/Discussion:   S/P HMII LVAD implant on 9/1   Epistaxis, resolved. Packing removed.   INR today 1.58 . Complete zosyn  Received  2 FFP 9/11-->INR 4.28>2.56 >1.67 >1.58  .     Anemia  9/12 Hemoglobin 7.3 received 1UPRBCs. Hemoglobin trending back  down. 9.2>7.9 . May need another unit of blood today.    Volume status ok. Will need a little lasix tomorrow.  CO-OX stable  54%. Continue milrinone 0.25 mcg.   Maps improved.  Renal function stable. Continue 2.5 mg lisinopril daily     9/8 echo appeared to show the IV septum bowing some towards the left, but he has had few PI events.  Ramp ECHO performed yesterday with no change in speed--> 9200.     AF rate 90s-100s. On po amiodarone.   Ileus: resolved.     Length of Stay: 19  CLEGG,AMY NP-C  01/02/2015 6:41 AM  Patient seen and examined with Darrick Grinder, NP. We discussed all aspects of the encounter. I agree with the assessment and plan as stated above.   Much improved today. Co-ox borderline. Would continue milrinone and revatio. Volume status slightly elevated. Will start torsemide 20mg  daily. Continue to mobilize and progress. Ramp echo 9/13. Keep VAD speed at 9200. AF rate improved. Drop amio to 200 bid. VAD parameters reviewed personally.   [name], Daniel,MD 10:40 AM

## 2015-01-02 NOTE — Progress Notes (Signed)
Patient ID: [name], male   DOB: 02/06/1971, 44 y.o.   MRN: [id number] HeartMate 2 Rounding Note  Subjective:    No complaints today. Eating, bowels working normally. Ambulated well. No further nose bleeding.  Remains on 0.25 mcg Milrinone. Co-ox stable at 54%.  Weight is up 9 lbs over the last 4 days if accurate.    LVAD INTERROGATION:  HeartMate II LVAD:  Flow 4.3 liters/min, speed 9200, power 5, PI 7.6.    Objective:    Vital Signs:   Temp:  [98 F (36.7 C)-98.8 F (37.1 C)] 98.8 F (37.1 C) (09/14 1513) Pulse Rate:  [84-94] 94 (09/14 1513) Resp:  [17-18] 17 (09/14 1513) BP: (98-105)/(67-85) 98/83 mmHg (09/14 1513) SpO2:  [97 %-99 %] 97 % (09/14 1513) Weight:  [79.425 kg (175 lb 1.6 oz)] 79.425 kg (175 lb 1.6 oz) (09/14 0425) Last BM Date: 01/02/15 Mean arterial Pressure 79  Intake/Output:   Intake/Output Summary (Last 24 hours) at 01/02/15 1843 Last data filed at 01/02/15 1500  Gross per 24 hour  Intake    681 ml  Output   1550 ml  Net   -869 ml     Physical Exam: General:  Well appearing. No resp difficulty HEENT: normal Cor: Normal heart sounds with LVAD hum present. Lungs: clear Abdomen: soft, nontender, nondistended. No hepatosplenomegaly. No bruits or masses. Good bowel sounds. Extremities: no cyanosis, clubbing, rash, edema Neuro: alert & orientedx3, cranial nerves grossly intact. moves all 4 extremities w/o difficulty. Affect pleasant  Telemetry: AF  Labs: Basic Metabolic Panel:  Recent Labs Lab 12/27/14 0047  12/29/14 1935 12/30/14 0413 12/31/14 0413 01/01/15 0405 01/01/15 1610 01/02/15 0453  NA 135  < > 138 140 140 138 140 136  K 4.1  < > 4.2 3.9 3.8 3.7 3.5 4.3  CL 95*  < > 103 106 104 103 106 105  CO2 29  < > 26 26 30 28   --  26  GLUCOSE 110*  < > 90 105* 98 107* 111* 95  BUN 46*  < > 22* 20 23* 18 14 14   CREATININE 1.61*  < > 1.25* 1.25* 1.35* 1.22 1.00 1.15  CALCIUM 8.9  < > 8.5* 8.8* 8.6* 8.2*  --  8.3*  MG 2.5*  --   --    --   --   --   --   --   PHOS 2.0*  --   --   --   --   --   --   --   < > = values in this interval not displayed.  Liver Function Tests:  Recent Labs Lab 12/27/14 0047 12/28/14 0409  AST 31 31  ALT 19 21  ALKPHOS 115 116  BILITOT 1.4* 1.3*  PROT 6.9 6.5  ALBUMIN 2.6* 2.3*   No results for input(s): LIPASE, AMYLASE in the last 168 hours. No results for input(s): AMMONIA in the last 168 hours.  CBC:  Recent Labs Lab 12/27/14 0047  12/29/14 0800 12/30/14 0413 12/31/14 0413 01/01/15 0405 01/01/15 1610 01/02/15 0453  WBC 11.5*  < > 10.3 9.0 7.3 7.5  --  9.1  NEUTROABS 8.0*  --   --   --   --   --   --   --   HGB 9.0*  < > 7.2* 7.9* 7.3* 8.2* 9.2* 7.9*  HCT 27.5*  < > 23.1* 25.1* 23.9* 25.9* 27.0* 25.8*  MCV 64.6*  < > 66.4* 67.1* 66.2* 70.0*  --  69.2*  PLT 435*  < > 600* 613* 619* 525*  --  525*  < > = values in this interval not displayed.  INR:  Recent Labs Lab 12/29/14 0350 12/30/14 0413 12/31/14 0413 01/01/15 0405 01/02/15 0453  INR 3.64* 4.28* 2.56* 1.67* 1.58*   LDH: 244  Other results:  EKG:   Imaging: Dg Chest Port 1 View  01/01/2015   CLINICAL DATA:  Left ventricular assist device.  EXAM: PORTABLE CHEST - 1 VIEW  COMPARISON:  12/28/2014.  FINDINGS: Cardiac pacer left ventricular assist device in stable position. Prior median sternotomy. Cardiomegaly with normal pulmonary vascularity. Left lower lobe atelectasis and/or infiltrate with small left pleural effusion. No pneumothorax.  IMPRESSION: 1. Cardiac pacer and left ventricular assist device stable position. Right PICC line in stable position. Prior median sternotomy. Stable cardiomegaly. 2. Left lower lobe atelectasis and/or infiltrate with small left pleural effusion.   Electronically Signed   By: Marcello Moores  Register   On: 01/01/2015 08:03      Medications:     Scheduled Medications: . allopurinol  100 mg Oral Daily  . amiodarone  200 mg Oral BID  . amoxicillin-clavulanate  1 tablet Oral  Q12H  . antiseptic oral rinse  7 mL Mouth Rinse QID  . bisacodyl  10 mg Oral Daily   Or  . bisacodyl  10 mg Rectal Daily  . citalopram  20 mg Oral Daily  . digoxin  0.125 mg Oral Daily  . feeding supplement (ENSURE ENLIVE)  237 mL Oral TID BM  . metoCLOPramide (REGLAN) injection  10 mg Intravenous 4 times per day  . pantoprazole  40 mg Oral Daily  . sildenafil  20 mg Oral TID  . sodium chloride  10-40 mL Intracatheter Q12H  . sodium chloride  3 mL Intravenous Q12H  . torsemide  20 mg Oral Daily  . Warfarin - Pharmacist Dosing Inpatient   Does not apply q1800     Infusions: . sodium chloride 10 mL/hr at 01/01/15 1600  . sodium chloride 250 mL (01/01/15 1600)  . lactated ringers Stopped (12/20/14 2000)  . lactated ringers Stopped (12/22/14 0600)  . milrinone 0.25 mcg/kg/min (01/02/15 0753)     PRN Medications:  hydrALAZINE, lidocaine, lidocaine, lidocaine-EPINEPHrine, morphine injection, ondansetron (ZOFRAN) IV, oxyCODONE, oxymetazoline, silver nitrate applicators, sodium chloride, sodium chloride, sodium chloride, traMADol, TRIPLE ANTIBIOTIC, zolpidem   Assessment:   1. Acute/chonic systolic HF , NICM EF 0000000 with Cardiogenic shock on milrinone and levophed, IABP preoop, s/p HMII implant 12/20/2014 2. Chronic atrial fib 3. Postop atrial flutter with RVR 4. Expected EBL anemia 5. Fever early postop, resolved 6. Acute post op non-oliguric renal failure 7. Epistaxis with supra-therapeutic INR.   Plan/Discussion:    He is doing well overall. Continues on milrinone with adequate Co-ox that is probably lower than ideal due to anemia. I think his RV function looks ok on echo yesterday. MAP good on Revatio and lisinopril.  INR down to 1.58 after FFP. He needs to restart coumadin. Pharmacy is managing.  Significant anemia. He received 1 unit PRBC's with slight improvement. He probably needs some diuresis.  Continue mobilization and teaching.  I reviewed the LVAD parameters  from today, and compared the results to the patient's prior recorded data.  No programming changes were made.  The LVAD is functioning within specified parameters.  The patient performs LVAD self-test daily.  LVAD interrogation was negative for any significant power changes, alarms or PI events/speed drops.  LVAD equipment check completed and  is in good working order.  Back-up equipment present.   LVAD education done on emergency procedures and precautions and reviewed exit site care.  Length of Stay: [address]  Fernande Boyden [facility] 01/02/2015, 6:43 PM

## 2015-01-02 NOTE — Progress Notes (Addendum)
[name] is sleeping today and I did not awaken. We will continue to shadow/follow along. Please call Wadie Lessen, NP [phone] for any further acute palliative needs.   Vinie Sill, NP Palliative Medicine Team Pager # [phone] (M-F 8a-5p) Team Phone # [phone] (Nights/Weekends)

## 2015-01-02 NOTE — Progress Notes (Signed)
Physical Therapy Treatment Patient Details Name: [name] MRN: [id number] DOB: 1970/04/27 Today's Date: 01/02/2015    History of Present Illness Patient is a 44 yo male admitted 12/14/14 with heart failure. Patient s/p LVAD on 12/20/14. 9/8 developed bil nose bleed (anticoagulated) and required transfer back to ICU PMH: NICM, EF 20-25%, CHF, ICD, Afib, HTN, severe MR/TR, RA    PT Comments    Pt able to transition from w/c to RW for ambulation however had decreased ambulation tolerance with RW. Pt c/o fatigue this date and not getting enough sleep. Pt with flat affect today. Pt with improved skills changing LVAD from battery to wall unit.   Follow Up Recommendations  Home health PT;Supervision/Assistance - 24 hour     Equipment Recommendations  Rolling walker with 5" wheels    Recommendations for Other Services       Precautions / Restrictions Precautions Precautions: Sternal;Fall Precaution Comments: LVAD;  Restrictions Weight Bearing Restrictions:  (sternal precautions)    Mobility  Bed Mobility Overal bed mobility: Needs Assistance Bed Mobility: Rolling;Sidelying to Sit Rolling: Supervision Sidelying to sit: Min assist       General bed mobility comments: pt hugged heart pillow, assist to elevate trunk  Transfers Overall transfer level: Needs assistance Equipment used: Rolling walker (2 wheeled) Transfers: Sit to/from Stand Sit to Stand: Min guard         General transfer comment: pt hugged pillow and rocked forward, did not need to use hands  Ambulation/Gait Ambulation/Gait assistance: Min guard Ambulation Distance (Feet): 250 Feet Assistive device: Rolling walker (2 wheeled) Gait Pattern/deviations: Step-through pattern;Decreased stride length Gait velocity: decreased   General Gait Details: 2 standing rest breaks, c/o fatigue, "I'm tired". pt able to manage RW well but pt reports this to more taxing than w/c   Stairs            Wheelchair  Mobility    Modified Rankin (Stroke Patients Only)       Balance                                    Cognition Arousal/Alertness: Awake/alert Behavior During Therapy: Flat affect Overall Cognitive Status: Within Functional Limits for tasks assessed                      Exercises      General Comments General comments (skin integrity, edema, etc.): pt able to switch LVAD from wall unit to battery power with supervision. mild fine motor deficit noted. Pt also able to test batteries for charge      Pertinent Vitals/Pain Pain Assessment: No/denies pain    Home Living                      Prior Function            PT Goals (current goals can now be found in the care plan section) Acute Rehab PT Goals Patient Stated Goal: home Progress towards PT goals: Progressing toward goals    Frequency  Min 3X/week    PT Plan Current plan remains appropriate    Co-evaluation             End of Session   Activity Tolerance: Patient limited by fatigue Patient left: with call bell/phone within reach (sitting EOB for lunch)     Time: [id number] PT Time Calculation (min) (ACUTE ONLY): 29 min  Charges:  $Gait Training: 8-22 mins $Therapeutic Activity: 8-22 mins                    G Codes:      Kingsley Callander 01/02/2015, 12:24 PM   Kittie Plater, PT, DPT Pager #: [phone] Office #: [phone]

## 2015-01-03 ENCOUNTER — Other Ambulatory Visit (HOSPITAL_COMMUNITY): Payer: Self-pay | Admitting: Infectious Diseases

## 2015-01-03 LAB — CARBOXYHEMOGLOBIN
CARBOXYHEMOGLOBIN: 1.7 % — AB (ref 0.5–1.5)
METHEMOGLOBIN: 1 % (ref 0.0–1.5)
O2 Saturation: 54.7 %
Total hemoglobin: 8.1 g/dL — ABNORMAL LOW (ref 13.5–18.0)

## 2015-01-03 LAB — LACTATE DEHYDROGENASE: LDH: 266 U/L — ABNORMAL HIGH (ref 98–192)

## 2015-01-03 LAB — CBC
HCT: 27.5 % — ABNORMAL LOW (ref 39.0–52.0)
HEMOGLOBIN: 8.4 g/dL — AB (ref 13.0–17.0)
MCH: 21.3 pg — ABNORMAL LOW (ref 26.0–34.0)
MCHC: 30.5 g/dL (ref 30.0–36.0)
MCV: 69.6 fL — ABNORMAL LOW (ref 78.0–100.0)
Platelets: 539 10*3/uL — ABNORMAL HIGH (ref 150–400)
RBC: 3.95 MIL/uL — ABNORMAL LOW (ref 4.22–5.81)
RDW: 22.7 % — AB (ref 11.5–15.5)
WBC: 9.5 10*3/uL (ref 4.0–10.5)

## 2015-01-03 LAB — PROTIME-INR
INR: 1.45 (ref 0.00–1.49)
Prothrombin Time: 17.8 seconds — ABNORMAL HIGH (ref 11.6–15.2)

## 2015-01-03 LAB — HEPARIN LEVEL (UNFRACTIONATED): HEPARIN UNFRACTIONATED: 0.1 [IU]/mL — AB (ref 0.30–0.70)

## 2015-01-03 MED ORDER — WARFARIN SODIUM 4 MG PO TABS
4.0000 mg | ORAL_TABLET | Freq: Once | ORAL | Status: AC
  Administered 2015-01-03: 4 mg via ORAL
  Filled 2015-01-03: qty 1

## 2015-01-03 MED ORDER — SODIUM CHLORIDE 0.9 % IV BOLUS (SEPSIS)
500.0000 mL | Freq: Once | INTRAVENOUS | Status: AC
  Administered 2015-01-03: 500 mL via INTRAVENOUS

## 2015-01-03 MED ORDER — HEPARIN (PORCINE) IN NACL 100-0.45 UNIT/ML-% IJ SOLN
1450.0000 [IU]/h | INTRAMUSCULAR | Status: DC
  Administered 2015-01-03: 850 [IU]/h via INTRAVENOUS
  Administered 2015-01-04 – 2015-01-05 (×2): 1400 [IU]/h via INTRAVENOUS
  Administered 2015-01-05: 1450 [IU]/h via INTRAVENOUS
  Filled 2015-01-03 (×4): qty 250

## 2015-01-03 NOTE — Progress Notes (Addendum)
ANTICOAGULATION CONSULT NOTE - Follow Up Consult  Pharmacy Consult for heparin Indication: atrial fibrillation and LVAD  No Known Allergies  Patient Measurements: Height: 5\' 7"  (170.2 cm) Weight: 171 lb 14.4 oz (77.973 kg) IBW/kg (Calculated) : 66.1  Vital Signs: Temp: 98.6 F (37 C) (09/15 1345) Temp Source: Oral (09/15 1345) BP: 97/75 mmHg (09/15 1345) Pulse Rate: 98 (09/15 1345)  Labs:  Recent Labs  01/01/15 0405 01/01/15 1610 01/02/15 0453 01/03/15 0533 01/03/15 0750 01/03/15 1553  HGB 8.2* 9.2* 7.9*  --  8.4*  --   HCT 25.9* 27.0* 25.8*  --  27.5*  --   PLT 525*  --  525*  --  539*  --   LABPROT 19.7*  --  18.9* 17.8*  --   --   INR 1.67*  --  1.58* 1.45  --   --   HEPARINUNFRC  --   --   --   --   --  0.10*  CREATININE 1.22 1.00 1.15  --   --   --     Estimated Creatinine Clearance: 75 mL/min (by C-G formula based on Cr of 1.15).   Medications:  Infusions:  . sodium chloride 10 mL/hr at 01/01/15 1600  . sodium chloride 250 mL (01/01/15 1600)  . heparin 850 Units/hr (01/03/15 0912)  . lactated ringers Stopped (12/20/14 2000)  . lactated ringers Stopped (12/22/14 0600)  . milrinone 0.25 mcg/kg/min (01/03/15 0426)    Assessment: 44 y/o male on IV heparin to bridge warfarin post LVAD and for Afib. Heparin level is subtherapeutic at 0.1 on 850 units/hr. Had previous epistaxis but no further bleeding reported. Hb and platelets stable.  Goal of Therapy:  Heparin level 0.3-0.5 units/ml Monitor platelets by anticoagulation protocol: Yes   Plan:  - Increase heparin drip to 1100 units/hr - 6 hr heparin level - Monitor for s/sx of bleeding  [facility], [place].D., BCPS Clinical Pharmacist Pager: [phone] 01/03/2015 4:49 PM   ADDN: Follow-up HL remains subtherapeutic at 0.19 on heparin 1100 units/hr. Nurse reports no issues with infusion or bleeding.  Plan: Increase heparin to 1350 units/hr 6h HL  Andrey Cota. Diona Foley, PharmD Clinical Pharmacist Pager  [phone]

## 2015-01-03 NOTE — Progress Notes (Signed)
Patient having right leg cramp. Patient stood to attempt walking. PI dropped to 3.9 several PI events, HR increased to 120's-140's with multiple PVCs, patient felt dizzy and fell to his side on the bed. Patient had a run of 10 beats of Vtach. Patient in and out of consciousness. MAP 70. CN to bedside. Patient placed back in bed. PI increased to 7.4. Patient stated dizziness relieved. MAP 75. VAD team paged. Dr. Haroldine Laws notified. Order placed. Will continue to monitor. Call bell within reach.   Domingo Dimes RN

## 2015-01-03 NOTE — Social Work (Addendum)
CSW met with patient at bedside. Patient reports he is feeling stronger and better everyday. Patient is hopeful to go home the beginning of next week. Patient states his sisters will be here from [facility] for his discharge. Patient states "I'm ready to go home". Patient verbalizes understanding of VAD equipment and states his sisters have a ready for caregiving responsibilities. CSW discussed the LVAD support group and encouraged patient and caregivers to attend. CSW will continue to follow for support as needed on the outpatient clinic. Raquel Sarna, [place]

## 2015-01-03 NOTE — Progress Notes (Addendum)
ANTICOAGULATION CONSULT NOTE - Follow Up Consult  Pharmacy Consult for Coumadin/heparin Indication: afib/LVAD  No Known Allergies  Patient Measurements: Height: 5\' 7"  (170.2 cm) Weight: 171 lb 14.4 oz (77.973 kg) IBW/kg (Calculated) : 66.1  Vital Signs: Temp: 98.3 F (36.8 C) (09/15 0401) Temp Source: Oral (09/15 0401) Pulse Rate: 95 (09/15 0900)  Labs:  Recent Labs  01/01/15 0405 01/01/15 1610 01/02/15 0453 01/03/15 0533 01/03/15 0750  HGB 8.2* 9.2* 7.9*  --  8.4*  HCT 25.9* 27.0* 25.8*  --  27.5*  PLT 525*  --  525*  --  539*  LABPROT 19.7*  --  18.9* 17.8*  --   INR 1.67*  --  1.58* 1.45  --   CREATININE 1.22 1.00 1.15  --   --     Estimated Creatinine Clearance: 75 mL/min (by C-G formula based on Cr of 1.15).  Assessment: 44yom on eliquis pta for afib/hx DVT, now transitioned to coumadin post LVAD. He received 4 doses and INR trended up to 4.45. He also developed epistaxis. Coumadin held and he received FFP on 9/8.  INR initially trended down to 2.4 but then trended back up to 4.28 now trending down to 1.45, restarted warfarin 9/13. INR continues to be low, will start IV heparin bridge (no bolusing).  No bleeding overnight, nose bleeding resolved, hgb up this am to 8.4. Decreasing amio to 200mg  bid. Patient was quickly titrated up on coumadin last time and it resulted in a therapeutic INR after a 2.5>5>7.5mg  dosing so will be cautious with dose increases while also on heparin.  Goal of Therapy:  Heparin level goal 0.3-0.5 INR 2-3 Monitor platelets by anticoagulation protocol: Yes   Plan:  1) Warfarin 4mg  today 2) Follow daily INR 3) Start low dose heparin this am - 850 units/hr - will aim for low goal.  Erin Hearing PharmD., BCPS Clinical Pharmacist Pager [phone] 01/03/2015 11:27 AM

## 2015-01-03 NOTE — Progress Notes (Signed)
Nutrition Follow-up  DOCUMENTATION CODES:   Not applicable  INTERVENTION:    Continue Ensure Enlive po TID, each supplement provides 350 kcal and 20 grams of protein  NUTRITION DIAGNOSIS:   Increased nutrient needs related to  (post op healing) as evidenced by estimated needs, ongoing  GOAL:   Patient will meet greater than or equal to 90% of their needs, met  MONITOR:   PO intake, Supplement acceptance, Labs, Weight trends, I & O's  ASSESSMENT:   [name] is a 44 y.o. 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  S/p procedure 9/1: INSERTION OF IMPLANTABLE LEFT VENTRICULAR ASSIST DEVICE   Pt reports his appetite has improved.  PO intake 100% at breakfast this AM.  Drinking his Ensure Enlive supplements.   Diet Order:  Diet Heart Room service appropriate?: Yes; Fluid consistency:: Thin  Skin:  Reviewed, no issues  Last BM:  9/14  Height:   Ht Readings from Last 1 Encounters:  12/20/14 _0  (1.702 m)    Weight:   Wt Readings from Last 1 Encounters:  01/03/15 171 lb 14.4 oz (77.973 kg)    Ideal Body Weight:  67.3 kg  BMI:  Body mass index is 26.92 kg/(m^2).  Estimated Nutritional Needs:   Kcal:  1800-2000  Protein:  90-100 grams  Fluid:  1.8-2.0 L  EDUCATION NEEDS:   No education needs identified at this time  Arthur Holms, RD, LDN Pager #: [phone] After-Hours Pager #: [phone]

## 2015-01-03 NOTE — Progress Notes (Signed)
CARDIAC REHAB PHASE I   PRE:  Rate/Rhythm: 99 afib    BP: sitting 90    SaO2: 96 RA  MODE:  Ambulation: 690 ft   POST:  Rate/Rhythm: 120    BP: sitting 90     SaO2: 96 RA  Pt walking with rollator and donning batteries independently however pt is very sore in his shoulders and requires max assist to sit up as he will not raise his arms at all when going from lying to sitting. He holds pillow across chest and lets staff raise him even though I encouraged him to try. Pt able to raise arms when he doesn't have resistance but sts it hurts. Encouraged him to roll his shoulders and lift his arms more when sitting up. PT to see today. Increased distance with x2 rest stops for fatigue and SOB. HR higher today. Overall doing very well. To recliner with feet elevated. Left on batteries at his request. Will f/u. [id number]  Darrick Meigs CES, ACSM 01/03/2015 11:31 AM

## 2015-01-03 NOTE — Progress Notes (Signed)
Patient ID: [name], male   DOB: [birth year], 44 y.o.   MRN: [id number] HeartMate 2 Rounding Note  Subjective:    No complaints. Ambulated with rest stops x 2. Says he has been eating well. Bowels working.   Denies shortness of breath. Says he has been urinating a lot after diuretic. Wt down 3 lbs. -1500 cc  Remains on Milrinone 0.25 with Co-ox 55%  LVAD INTERROGATION:  HeartMate II LVAD:  Flow 4.4 liters/min, speed 9200, power 5, PI 8.     Objective:    Vital Signs:   Temp:  [98.3 F (36.8 C)-98.6 F (37 C)] 98.6 F (37 C) (09/15 1345) Pulse Rate:  [88-99] 98 (09/15 1345) Resp:  [16-19] 19 (09/15 1345) BP: (97-101)/(75-79) 97/75 mmHg (09/15 1345) SpO2:  [97 %-98 %] 98 % (09/15 1345) Weight:  [77.973 kg (171 lb 14.4 oz)] 77.973 kg (171 lb 14.4 oz) (09/15 0401) Last BM Date: 01/02/15 Mean arterial Pressure 81  Intake/Output:   Intake/Output Summary (Last 24 hours) at 01/03/15 1623 Last data filed at 01/03/15 1451  Gross per 24 hour  Intake    577 ml  Output   1225 ml  Net   -648 ml     Physical Exam: General:  Well appearing. No resp difficulty HEENT: normal Neck: supple. Carotids 2+ bilat; no bruits. No lymphadenopathy or thryomegaly appreciated. Cor: Normal  heart sounds with LVAD hum present. Lungs: clear Abdomen: soft, nontender, nondistended. No hepatosplenomegaly. No bruits or masses. Good bowel sounds. Extremities: no cyanosis, clubbing, rash, edema Neuro: alert & orientedx3, cranial nerves grossly intact. moves all 4 extremities w/o difficulty. Affect pleasant  Telemetry: AF 90's  Labs: Basic Metabolic Panel:  Recent Labs Lab 12/29/14 1935 12/30/14 0413 12/31/14 0413 01/01/15 0405 01/01/15 1610 01/02/15 0453  NA 138 140 140 138 140 136  K 4.2 3.9 3.8 3.7 3.5 4.3  CL 103 106 104 103 106 105  CO2 26 26 30 28   --  26  GLUCOSE 90 105* 98 107* 111* 95  BUN 22* 20 23* 18 14 14   CREATININE 1.25* 1.25* 1.35* 1.22 1.00 1.15  CALCIUM 8.5* 8.8*  8.6* 8.2*  --  8.3*    Liver Function Tests:  Recent Labs Lab 12/28/14 0409  AST 31  ALT 21  ALKPHOS 116  BILITOT 1.3*  PROT 6.5  ALBUMIN 2.3*   No results for input(s): LIPASE, AMYLASE in the last 168 hours. No results for input(s): AMMONIA in the last 168 hours.  CBC:  Recent Labs Lab 12/30/14 0413 12/31/14 0413 01/01/15 0405 01/01/15 1610 01/02/15 0453 01/03/15 0750  WBC 9.0 7.3 7.5  --  9.1 9.5  HGB 7.9* 7.3* 8.2* 9.2* 7.9* 8.4*  HCT 25.1* 23.9* 25.9* 27.0* 25.8* 27.5*  MCV 67.1* 66.2* 70.0*  --  69.2* 69.6*  PLT 613* 619* 525*  --  525* 539*    INR:  Recent Labs Lab 12/30/14 0413 12/31/14 0413 01/01/15 0405 01/02/15 0453 01/03/15 0533  INR 4.28* 2.56* 1.67* 1.58* 1.45    Other results:  EKG:   Imaging:  No results found.   Medications:     Scheduled Medications: . allopurinol  100 mg Oral Daily  . amiodarone  200 mg Oral BID  . amoxicillin-clavulanate  1 tablet Oral Q12H  . antiseptic oral rinse  7 mL Mouth Rinse QID  . bisacodyl  10 mg Oral Daily   Or  . bisacodyl  10 mg Rectal Daily  . citalopram  20 mg Oral  Daily  . digoxin  0.125 mg Oral Daily  . feeding supplement (ENSURE ENLIVE)  237 mL Oral TID BM  . metoCLOPramide (REGLAN) injection  10 mg Intravenous 4 times per day  . pantoprazole  40 mg Oral Daily  . sildenafil  20 mg Oral TID  . sodium chloride  10-40 mL Intracatheter Q12H  . sodium chloride  3 mL Intravenous Q12H  . torsemide  20 mg Oral Daily  . warfarin  4 mg Oral ONCE-1800  . Warfarin - Pharmacist Dosing Inpatient   Does not apply q1800     Infusions: . sodium chloride 10 mL/hr at 01/01/15 1600  . sodium chloride 250 mL (01/01/15 1600)  . heparin 850 Units/hr (01/03/15 0912)  . lactated ringers Stopped (12/20/14 2000)  . lactated ringers Stopped (12/22/14 0600)  . milrinone 0.25 mcg/kg/min (01/03/15 0426)     PRN Medications:  hydrALAZINE, lidocaine, lidocaine, lidocaine-EPINEPHrine, morphine injection,  ondansetron (ZOFRAN) IV, oxyCODONE, oxymetazoline, silver nitrate applicators, sodium chloride, sodium chloride, sodium chloride, traMADol, TRIPLE ANTIBIOTIC, zolpidem   Assessment:   1. Acute/chonic systolic HF , NICM EF 0000000 with Cardiogenic shock on milrinone and levophed, IABP preoop, s/p HMII implant 12/20/2014 2. Chronic atrial fib 3. Postop atrial flutter with RVR 4. Expected EBL anemia 5. Fever early postop, resolved 6. Acute post op non-oliguric renal failure 7. Epistaxis with supra-therapeutic INR.  Plan/Discussion:    Overall he is doing very well. Pump parameters stable and Co-ox stable at 55 on Milrinone. Dr. Haroldine Laws will decide when to decrease that.  INR subtherapeutic but is on heparin drip now until INR rises. Pharmacy managing coumadin.  Anemia stable to improved. I would expect this to continue to improve as long as there is no further bleeding.   Continue mobilization, teaching, nutrition.  I reviewed the LVAD parameters from today, and compared the results to the patient's prior recorded data.  No programming changes were made.  The LVAD is functioning within specified parameters.  The patient performs LVAD self-test daily.  LVAD interrogation was negative for any significant power changes, alarms or PI events/speed drops.  LVAD equipment check completed and is in good working order.  Back-up equipment present.   LVAD education done on emergency procedures and precautions and reviewed exit site care.  Length of Stay: [address]  Gaye Pollack 01/03/2015, 4:23 PM

## 2015-01-03 NOTE — Progress Notes (Signed)
Prescription for sildenafil 20 mg tabs; i tab TID sent to requested pharmacy and no authorization is required. Monthly out of pocket is $7/mo at current dose.   Will be ready at D/C home.   [name], RN VAD Coordinator   Office: [phone] 24/7 VAD Pager: [phone]

## 2015-01-03 NOTE — Progress Notes (Signed)
Patient ID: [name], male   DOB: 12-23-1970, 44 y.o.   MRN: [id number]           VAD Team Rounding Note  Subjective:    s/p HMII implant 9/1  9/6 Milrinone cut back to 0.125 mcg.  VAD was increased to 9200.  9/7 Continued on milrinone 0.125 mcg. INR 4.4 Transferred to 2W 9/8 Developed epitaxis. Coughing up clots. INR 4.23 Given FFP.  CXR ok ENT consulted.  9/11 INR 4.28 Received 2 FFP 9/12 INR today 2.56 Hgb 7.3 received 1UPRBCs. Amio transitioned to po. CO-OX 47%. Milrinone increased to 0.25 mcg.  9/13 INR 1.6 CO-OX 56% on milrinone 0.25 mcg. Transferred to 2W 9/13 Ramp echo speed maintained at 9200  No bleeding over night. Denies SOB/Orthopnea.  Remains on milrinone 0.25 mcg as well as revatio started. Co-ox 54%  LVAD INTERROGATION:  HeartMate II LVAD:  Flow 4.1 liters/min, speed 9170, power 5,  PI 8.3 NO PI events over niight.      Objective:    Vital Signs:   Temp:  [98.3 F (36.8 C)-98.8 F (37.1 C)] 98.3 F (36.8 C) (09/15 0401) Pulse Rate:  [88-99] 99 (09/15 0401) Resp:  [16-18] 16 (09/15 0401) BP: (98-101)/(78-83) 98/79 mmHg (09/14 2300) SpO2:  [97 %-98 %] 97 % (09/15 0401) Weight:  [171 lb 14.4 oz (77.973 kg)] 171 lb 14.4 oz (77.973 kg) (09/15 0401) Last BM Date: 01/02/15 Mean arterial Pressure 80s  Intake/Output:   Intake/Output Summary (Last 24 hours) at 01/03/15 0715 Last data filed at 01/03/15 0401  Gross per 24 hour  Intake    237 ml  Output   1825 ml  Net  -1588 ml     Physical Exam:  General: In bed.  NAD  HEENT: normal .  Neck: supple. JVP to jaw Cor: Sternal incision approximated.   Mechanical heart sounds with LVAD hum present. Lungs: Clear   Abdomen: soft, nontender, nondistended. + bowel sounds. Driveline: C/D/I; securement device intact and driveline incorporated Extremities: no cyanosis, clubbing, rash, no edema.  Neuro: awake conversant Flat affect    Telemetry: AF 80-90s   Labs: Basic Metabolic Panel:  Recent Labs Lab  12/29/14 1935 12/30/14 0413 12/31/14 0413 01/01/15 0405 01/01/15 1610 01/02/15 0453  NA 138 140 140 138 140 136  K 4.2 3.9 3.8 3.7 3.5 4.3  CL 103 106 104 103 106 105  CO2 26 26 30 28   --  26  GLUCOSE 90 105* 98 107* 111* 95  BUN 22* 20 23* 18 14 14   CREATININE 1.25* 1.25* 1.35* 1.22 1.00 1.15  CALCIUM 8.5* 8.8* 8.6* 8.2*  --  8.3*    Liver Function Tests:  Recent Labs Lab 12/28/14 0409  AST 31  ALT 21  ALKPHOS 116  BILITOT 1.3*  PROT 6.5  ALBUMIN 2.3*   No results for input(s): LIPASE, AMYLASE in the last 168 hours. No results for input(s): AMMONIA in the last 168 hours.  CBC:  Recent Labs Lab 12/29/14 0800 12/30/14 0413 12/31/14 0413 01/01/15 0405 01/01/15 1610 01/02/15 0453  WBC 10.3 9.0 7.3 7.5  --  9.1  HGB 7.2* 7.9* 7.3* 8.2* 9.2* 7.9*  HCT 23.1* 25.1* 23.9* 25.9* 27.0* 25.8*  MCV 66.4* 67.1* 66.2* 70.0*  --  69.2*  PLT 600* 613* 619* 525*  --  525*    INR:  Recent Labs Lab 12/30/14 0413 12/31/14 0413 01/01/15 0405 01/02/15 0453 01/03/15 0533  INR 4.28* 2.56* 1.67* 1.58* 1.45    Other  results:   Imaging: No results found.   Medications:     Scheduled Medications: . allopurinol  100 mg Oral Daily  . amiodarone  200 mg Oral BID  . amoxicillin-clavulanate  1 tablet Oral Q12H  . antiseptic oral rinse  7 mL Mouth Rinse QID  . bisacodyl  10 mg Oral Daily   Or  . bisacodyl  10 mg Rectal Daily  . citalopram  20 mg Oral Daily  . digoxin  0.125 mg Oral Daily  . feeding supplement (ENSURE ENLIVE)  237 mL Oral TID BM  . metoCLOPramide (REGLAN) injection  10 mg Intravenous 4 times per day  . pantoprazole  40 mg Oral Daily  . sildenafil  20 mg Oral TID  . sodium chloride  10-40 mL Intracatheter Q12H  . sodium chloride  3 mL Intravenous Q12H  . torsemide  20 mg Oral Daily  . Warfarin - Pharmacist Dosing Inpatient   Does not apply q1800    Infusions: . sodium chloride 10 mL/hr at 01/01/15 1600  . sodium chloride 250 mL (01/01/15 1600)   . lactated ringers Stopped (12/20/14 2000)  . lactated ringers Stopped (12/22/14 0600)  . milrinone 0.25 mcg/kg/min (01/03/15 0426)    PRN Medications: hydrALAZINE, lidocaine, lidocaine, lidocaine-EPINEPHrine, morphine injection, ondansetron (ZOFRAN) IV, oxyCODONE, oxymetazoline, silver nitrate applicators, sodium chloride, sodium chloride, sodium chloride, traMADol, TRIPLE ANTIBIOTIC, zolpidem   Assessment:   1. Cardiogenic shock s/p HMII implant 12/20/2014 2. Acute/chonic systolic HF   -- NICM EF 0000000 3. Chronic AF with RVR 4. Expected EBL anemia 5. Acute on chronic renal failure 6. PNA  7. Anemia  8. Epistaxis: right nasal packing.  9. Hemoptysis 10. Ileus: Resolving  Plan/Discussion:   S/P HMII LVAD implant on 9/1   Epistaxis, resolved. Packing removed.  INR today 1.45 . Complete zosyn  Received  2 FFP 9/11-->INR 4.28>2.56 >1.67 >1.58>1.45   .     Anemia  9/12 Hemoglobin 7.3 received 1UPRBCs. CBC pending.  May need another unit of blood if hgb lower.   Volume status ok. Will need a little lasix tomorrow.  CO-OX stable  54%. Continue milrinone 0.25 mcg.   Maps improved.  Renal function stable. Continue 2.5 mg lisinopril daily     9/8 echo appeared to show the IV septum bowing some towards the left, but he has had few PI events.  Ramp ECHO performed yesterday with no change in speed--> 9200.     AF rate 90s-100s. On po amiodarone.   Ileus: resolved.     Length of Stay: 20  CLEGG,AMY NP-C  01/03/2015 7:15 AM  Patient seen and examined with Darrick Grinder, NP. We discussed all aspects of the encounter. I agree with the assessment and plan as stated above.   Continue to improve. Co-ox 55%. Continue milrinone 0.25 for now. Volume status looks good. Continue education and ambulation. Hopefully home Monday. INR 1.45. VAD parameters are stable.  [name], Daniel,MD 6:12 PM

## 2015-01-03 NOTE — Progress Notes (Signed)
Utilization review completed.  

## 2015-01-04 ENCOUNTER — Encounter (HOSPITAL_COMMUNITY): Payer: Self-pay | Admitting: Infectious Diseases

## 2015-01-04 LAB — BASIC METABOLIC PANEL
ANION GAP: 7 (ref 5–15)
BUN: 14 mg/dL (ref 6–20)
CHLORIDE: 101 mmol/L (ref 101–111)
CO2: 28 mmol/L (ref 22–32)
Calcium: 8.3 mg/dL — ABNORMAL LOW (ref 8.9–10.3)
Creatinine, Ser: 1.21 mg/dL (ref 0.61–1.24)
Glucose, Bld: 90 mg/dL (ref 65–99)
POTASSIUM: 3.4 mmol/L — AB (ref 3.5–5.1)
SODIUM: 136 mmol/L (ref 135–145)

## 2015-01-04 LAB — PROTIME-INR
INR: 1.45 (ref 0.00–1.49)
Prothrombin Time: 17.7 seconds — ABNORMAL HIGH (ref 11.6–15.2)

## 2015-01-04 LAB — TYPE AND SCREEN
ABO/RH(D): O POS
Antibody Screen: NEGATIVE
Unit division: 0
Unit division: 0

## 2015-01-04 LAB — CBC
HCT: 25.5 % — ABNORMAL LOW (ref 39.0–52.0)
HEMOGLOBIN: 8.1 g/dL — AB (ref 13.0–17.0)
MCH: 22 pg — ABNORMAL LOW (ref 26.0–34.0)
MCHC: 31.8 g/dL (ref 30.0–36.0)
MCV: 69.3 fL — ABNORMAL LOW (ref 78.0–100.0)
PLATELETS: 404 10*3/uL — AB (ref 150–400)
RBC: 3.68 MIL/uL — AB (ref 4.22–5.81)
RDW: 23.5 % — ABNORMAL HIGH (ref 11.5–15.5)
WBC: 11.2 10*3/uL — AB (ref 4.0–10.5)

## 2015-01-04 LAB — SEDIMENTATION RATE: SED RATE: 48 mm/h — AB (ref 0–16)

## 2015-01-04 LAB — CARBOXYHEMOGLOBIN
CARBOXYHEMOGLOBIN: 2 % — AB (ref 0.5–1.5)
Methemoglobin: 0.8 % (ref 0.0–1.5)
O2 SAT: 54.5 %
Total hemoglobin: 8.2 g/dL — ABNORMAL LOW (ref 13.5–18.0)

## 2015-01-04 LAB — CK: CK TOTAL: 31 U/L — AB (ref 49–397)

## 2015-01-04 LAB — HEPARIN LEVEL (UNFRACTIONATED)
HEPARIN UNFRACTIONATED: 0.33 [IU]/mL (ref 0.30–0.70)
HEPARIN UNFRACTIONATED: 0.45 [IU]/mL (ref 0.30–0.70)
Heparin Unfractionated: 0.19 IU/mL — ABNORMAL LOW (ref 0.30–0.70)

## 2015-01-04 LAB — URIC ACID: URIC ACID, SERUM: 4.2 mg/dL — AB (ref 4.4–7.6)

## 2015-01-04 LAB — LACTATE DEHYDROGENASE: LDH: 266 U/L — ABNORMAL HIGH (ref 98–192)

## 2015-01-04 LAB — MAGNESIUM: Magnesium: 1.6 mg/dL — ABNORMAL LOW (ref 1.7–2.4)

## 2015-01-04 MED ORDER — ALLOPURINOL 300 MG PO TABS
300.0000 mg | ORAL_TABLET | Freq: Every day | ORAL | Status: DC
  Administered 2015-01-04 – 2015-01-08 (×5): 300 mg via ORAL
  Filled 2015-01-04 (×5): qty 1

## 2015-01-04 MED ORDER — MAGNESIUM SULFATE 4 GM/100ML IV SOLN
4.0000 g | Freq: Once | INTRAVENOUS | Status: AC
  Administered 2015-01-04: 4 g via INTRAVENOUS
  Filled 2015-01-04: qty 100

## 2015-01-04 MED ORDER — COLCHICINE 0.6 MG PO TABS
0.6000 mg | ORAL_TABLET | Freq: Two times a day (BID) | ORAL | Status: AC
  Administered 2015-01-04 (×2): 0.6 mg via ORAL
  Filled 2015-01-04 (×2): qty 1

## 2015-01-04 MED ORDER — POTASSIUM CHLORIDE CRYS ER 20 MEQ PO TBCR
40.0000 meq | EXTENDED_RELEASE_TABLET | Freq: Three times a day (TID) | ORAL | Status: AC
  Administered 2015-01-04 (×3): 40 meq via ORAL
  Filled 2015-01-04 (×3): qty 2

## 2015-01-04 MED ORDER — KETOROLAC TROMETHAMINE 30 MG/ML IJ SOLN
30.0000 mg | Freq: Once | INTRAMUSCULAR | Status: AC
  Administered 2015-01-04: 30 mg via INTRAVENOUS
  Filled 2015-01-04: qty 1

## 2015-01-04 MED ORDER — TORSEMIDE 20 MG PO TABS: 20.0000 mg | ORAL_TABLET | Freq: Every day | ORAL | Status: DC

## 2015-01-04 MED ORDER — TORSEMIDE 20 MG PO TABS: 40.0000 mg | ORAL_TABLET | Freq: Every day | ORAL | Status: DC

## 2015-01-04 MED ORDER — WARFARIN SODIUM 5 MG PO TABS
6.0000 mg | ORAL_TABLET | Freq: Once | ORAL | Status: AC
  Administered 2015-01-04: 6 mg via ORAL
  Filled 2015-01-04 (×2): qty 1

## 2015-01-04 NOTE — Progress Notes (Signed)
OT Cancellation Note  Patient Details Name: CILLIAN VALLEE MRN: [id number] DOB: [birth year]   Cancelled Treatment:    Reason Eval/Treat Not Completed: Pain limiting ability to participate  Bradley, OTR/L  [id number] 01/04/2015 01/04/2015, 7:32 PM

## 2015-01-04 NOTE — Discharge Instructions (Signed)

## 2015-01-04 NOTE — Progress Notes (Signed)
Patient ID: [name], male   DOB: 09-May-1970, 44 y.o.   MRN: [id number] Patient ID: [name], male   DOB: [birth year], 44 y.o.   MRN: [id number] HeartMate 2 Rounding Note  Subjective:    Had multiple PI events last pm associated with tachycardia and orthostasis when he stood to walk. Says he felt dizzy and had to lay back down. Give 500 cc bolus of IVF. Reports pain in both hips. H/O gout.  Denies shortness of breath.  Wt up 3 lbs. I/O even yesterday. Remains on Milrinone 0.25 with Co-ox 55%  LVAD INTERROGATION:  HeartMate II LVAD:  Flow 4.1 liters/min, speed 9200, power 5, PI 8.3     Objective:    Vital Signs:   Temp:  [98.5 F (36.9 C)-98.6 F (37 C)] 98.5 F (36.9 C) (09/16 0419) Pulse Rate:  [90-98] 95 (09/16 0419) Resp:  [18-19] 18 (09/16 0419) BP: (97-113)/(75-88) 113/88 mmHg (09/16 0851) SpO2:  [98 %] 98 % (09/16 0419) Weight:  [79.5 kg (175 lb 4.3 oz)] 79.5 kg (175 lb 4.3 oz) (09/16 0559) Last BM Date: 01/03/15 Mean arterial Pressure 80's  Intake/Output:   Intake/Output Summary (Last 24 hours) at 01/04/15 1027 Last data filed at 01/03/15 2311  Gross per 24 hour  Intake    799 ml  Output    950 ml  Net   -151 ml     Physical Exam: General:  Looks tired this am. No resp difficulty HEENT: normal Neck: supple. Carotids 2+ bilat; no bruits. No lymphadenopathy or thryomegaly appreciated. Cor: Normal  heart sounds with LVAD hum present. Lungs: clear Abdomen: soft, nontender, nondistended. No hepatosplenomegaly. No bruits or masses. Good bowel sounds. Extremities: no cyanosis, clubbing, rash, edema Neuro: alert & orientedx3, cranial nerves grossly intact. moves all 4 extremities w/o difficulty. Affect pleasant  Telemetry: AF 90's  Labs: Basic Metabolic Panel:  Recent Labs Lab 12/30/14 0413 12/31/14 0413 01/01/15 0405 01/01/15 1610 01/02/15 0453 01/04/15 0605  NA 140 140 138 140 136 136  K 3.9 3.8 3.7 3.5 4.3 3.4*  CL 106 104 103 106 105 101   CO2 26 30 28   --  26 28  GLUCOSE 105* 98 107* 111* 95 90  BUN 20 23* 18 14 14 14   CREATININE 1.25* 1.35* 1.22 1.00 1.15 1.21  CALCIUM 8.8* 8.6* 8.2*  --  8.3* 8.3*    Liver Function Tests: No results for input(s): AST, ALT, ALKPHOS, BILITOT, PROT, ALBUMIN in the last 168 hours. No results for input(s): LIPASE, AMYLASE in the last 168 hours. No results for input(s): AMMONIA in the last 168 hours.  CBC:  Recent Labs Lab 12/31/14 0413 01/01/15 0405 01/01/15 1610 01/02/15 0453 01/03/15 0750 01/04/15 0605  WBC 7.3 7.5  --  9.1 9.5 11.2*  HGB 7.3* 8.2* 9.2* 7.9* 8.4* 8.1*  HCT 23.9* 25.9* 27.0* 25.8* 27.5* 25.5*  MCV 66.2* 70.0*  --  69.2* 69.6* 69.3*  PLT 619* 525*  --  525* 539* 404*    INR:  Recent Labs Lab 12/31/14 0413 01/01/15 0405 01/02/15 0453 01/03/15 0533 01/04/15 0605  INR 2.56* 1.67* 1.58* 1.45 1.45   LDH: stable 266  Other results:  EKG:   Imaging: No results found.   Medications:     Scheduled Medications: . allopurinol  300 mg Oral Daily  . amiodarone  200 mg Oral BID  . antiseptic oral rinse  7 mL Mouth Rinse QID  . bisacodyl  10 mg Oral Daily   Or  .  bisacodyl  10 mg Rectal Daily  . citalopram  20 mg Oral Daily  . colchicine  0.6 mg Oral BID  . digoxin  0.125 mg Oral Daily  . feeding supplement (ENSURE ENLIVE)  237 mL Oral TID BM  . metoCLOPramide (REGLAN) injection  10 mg Intravenous 4 times per day  . pantoprazole  40 mg Oral Daily  . potassium chloride  40 mEq Oral TID  . sildenafil  20 mg Oral TID  . sodium chloride  10-40 mL Intracatheter Q12H  . sodium chloride  3 mL Intravenous Q12H  . warfarin  6 mg Oral ONCE-1800  . Warfarin - Pharmacist Dosing Inpatient   Does not apply q1800    Infusions: . sodium chloride 10 mL/hr at 01/01/15 1600  . sodium chloride 250 mL (01/01/15 1600)  . heparin 1,400 Units/hr (01/04/15 0851)  . lactated ringers Stopped (12/20/14 2000)  . lactated ringers Stopped (12/22/14 0600)  . milrinone  0.25 mcg/kg/min (01/03/15 1816)    PRN Medications: hydrALAZINE, lidocaine, lidocaine, lidocaine-EPINEPHrine, morphine injection, ondansetron (ZOFRAN) IV, oxyCODONE, oxymetazoline, silver nitrate applicators, sodium chloride, sodium chloride, sodium chloride, traMADol, TRIPLE ANTIBIOTIC, zolpidem   Assessment:   1. Acute/chonic systolic HF , NICM EF 0000000 with Cardiogenic shock on milrinone and levophed, IABP preoop, s/p HMII implant 12/20/2014 2. Chronic atrial fib 3. Postop atrial flutter with RVR 4. Expected EBL anemia 5. Fever early postop, resolved 6. Acute post op non-oliguric renal failure 7. Epistaxis with supra-therapeutic INR. 8. Bilateral hip pain with hx of gout   Plan/Discussion:    Overall he is doing very well. Pump parameters stable and Co-ox stable at 55 on Milrinone.  Orthostatic and PI events last pm so diuretic stopped for now.  INR subtherapeutic but is on heparin drip now until INR rises. Pharmacy managing coumadin.  Anemia stable. I would expect this to continue to improve as long as there is no further bleeding.   Continue mobilization, teaching, nutrition.  I reviewed the LVAD parameters from today, and compared the results to the patient's prior recorded data.  No programming changes were made.  The LVAD is functioning within specified parameters.  The patient performs LVAD self-test daily.  LVAD interrogation was negative for any significant power changes, alarms or PI events/speed drops.  LVAD equipment check completed and is in good working order.  Back-up equipment present.   LVAD education done on emergency procedures and precautions and reviewed exit site care.  Length of Stay: [address]  Fernande Boyden [facility] 01/04/2015, 10:27 AM

## 2015-01-04 NOTE — Progress Notes (Signed)
Patient ID: [name], male   DOB: [birth year], 44 y.o.   MRN: [id number]           VAD Team Rounding Note  Subjective:    s/p HMII implant 9/1  9/6 Milrinone cut back to 0.125 mcg.  VAD was increased to 9200.  9/7 Continued on milrinone 0.125 mcg. INR 4.4 Transferred to 2W 9/8 Developed epitaxis. Coughing up clots. INR 4.23 Given FFP.  CXR ok ENT consulted.  9/11 INR 4.28 Received 2 FFP 9/12 INR today 2.56 Hgb 7.3 received 1UPRBCs. Amio transitioned to po. CO-OX 47%. Milrinone increased to 0.25 mcg.  9/13 INR 1.6 CO-OX 56% on milrinone 0.25 mcg. Transferred to 2W 9/13 Ramp echo speed maintained at 9200 9/15 NSVT   Complaining of bilateral hip pain. Pain 10/10. No fall.  Remains on milrinone 0.25 mcg as well as revatio started. Co-ox 55%. Denies SOB.   LVAD INTERROGATION:  HeartMate II LVAD:  Flow 4.1 liters/min, speed 9170, power 5,  PI 8.3 16 PI events in the last 24 hours. .      Objective:    Vital Signs:   Temp:  [98.5 F (36.9 C)-98.6 F (37 C)] 98.5 F (36.9 C) (09/16 0419) Pulse Rate:  [90-98] 95 (09/16 0419) Resp:  [18-19] 18 (09/16 0419) BP: (97)/(75) 97/75 mmHg (09/15 1345) SpO2:  [98 %] 98 % (09/16 0419) Weight:  [175 lb 4.3 oz (79.5 kg)] 175 lb 4.3 oz (79.5 kg) (09/16 0559) Last BM Date: 01/03/15 Mean arterial Pressure 80s  Intake/Output:   Intake/Output Summary (Last 24 hours) at 01/04/15 0725 Last data filed at 01/03/15 2311  Gross per 24 hour  Intake   1039 ml  Output    950 ml  Net     89 ml     Physical Exam:  General: In bed.  NAD  HEENT: normal .  Neck: supple. JVP to jaw Cor: Sternal incision approximated.   Mechanical heart sounds with LVAD hum present. Lungs: Clear   Abdomen: soft, nontender, nondistended. + bowel sounds. Driveline: C/D/I; securement device intact and driveline incorporated Extremities: no cyanosis, clubbing, rash, no edema. R and LLE SCDs. R and L pedal pulse 3+  Neuro: awake conversant [place] affect    Telemetry:  AF 80-90s   Labs: Basic Metabolic Panel:  Recent Labs Lab 12/30/14 0413 12/31/14 0413 01/01/15 0405 01/01/15 1610 01/02/15 0453 01/04/15 0605  NA 140 140 138 140 136 136  K 3.9 3.8 3.7 3.5 4.3 3.4*  CL 106 104 103 106 105 101  CO2 26 30 28   --  26 28  GLUCOSE 105* 98 107* 111* 95 90  BUN 20 23* 18 14 14 14   CREATININE 1.25* 1.35* 1.22 1.00 1.15 1.21  CALCIUM 8.8* 8.6* 8.2*  --  8.3* 8.3*    Liver Function Tests: No results for input(s): AST, ALT, ALKPHOS, BILITOT, PROT, ALBUMIN in the last 168 hours. No results for input(s): LIPASE, AMYLASE in the last 168 hours. No results for input(s): AMMONIA in the last 168 hours.  CBC:  Recent Labs Lab 12/31/14 0413 01/01/15 0405 01/01/15 1610 01/02/15 0453 01/03/15 0750 01/04/15 0605  WBC 7.3 7.5  --  9.1 9.5 11.2*  HGB 7.3* 8.2* 9.2* 7.9* 8.4* 8.1*  HCT 23.9* 25.9* 27.0* 25.8* 27.5* 25.5*  MCV 66.2* 70.0*  --  69.2* 69.6* 69.3*  PLT 619* 525*  --  525* 539* 404*    INR:  Recent Labs Lab 12/31/14 0413 01/01/15 0405 01/02/15 0453 01/03/15  0533 01/04/15 0605  INR 2.56* 1.67* 1.58* 1.45 1.45    Other results:   Imaging: No results found.   Medications:     Scheduled Medications: . allopurinol  100 mg Oral Daily  . amiodarone  200 mg Oral BID  . amoxicillin-clavulanate  1 tablet Oral Q12H  . antiseptic oral rinse  7 mL Mouth Rinse QID  . bisacodyl  10 mg Oral Daily   Or  . bisacodyl  10 mg Rectal Daily  . citalopram  20 mg Oral Daily  . digoxin  0.125 mg Oral Daily  . feeding supplement (ENSURE ENLIVE)  237 mL Oral TID BM  . metoCLOPramide (REGLAN) injection  10 mg Intravenous 4 times per day  . pantoprazole  40 mg Oral Daily  . sildenafil  20 mg Oral TID  . sodium chloride  10-40 mL Intracatheter Q12H  . sodium chloride  3 mL Intravenous Q12H  . torsemide  20 mg Oral Daily  . Warfarin - Pharmacist Dosing Inpatient   Does not apply q1800    Infusions: . sodium chloride 10 mL/hr at 01/01/15 1600   . sodium chloride 250 mL (01/01/15 1600)  . heparin 1,350 Units/hr (01/04/15 0038)  . lactated ringers Stopped (12/20/14 2000)  . lactated ringers Stopped (12/22/14 0600)  . milrinone 0.25 mcg/kg/min (01/03/15 1816)    PRN Medications: hydrALAZINE, lidocaine, lidocaine, lidocaine-EPINEPHrine, morphine injection, ondansetron (ZOFRAN) IV, oxyCODONE, oxymetazoline, silver nitrate applicators, sodium chloride, sodium chloride, sodium chloride, traMADol, TRIPLE ANTIBIOTIC, zolpidem   Assessment:   1. Cardiogenic shock s/p HMII implant 12/20/2014 2. Acute/chonic systolic HF   -- NICM EF 0000000 3. Chronic AF with RVR 4. Expected EBL anemia 5. Acute on chronic renal failure 6. PNA  7. Anemia  8. Epistaxis: right nasal packing.  9. Hemoptysis 10. Ileus: Resolving 11. NSVT  12 Bilateral Hip Pain   Plan/Discussion:   S/P HMII LVAD implant on 9/1   Epistaxis, resolved. Packing removed.  INR today 1.45 . Completed antibiotic course.   Received  2 FFP 9/11-->INR 4.28>2.56 >1.67 >1.58>1.45>1.45    .     Anemia  9/12 Hemoglobin 7.3 received 1UPRBCs. Hemoglobin today 8.1  May need another unit of blood if hgb lower.   Volume status elevated but due to RV failure we wont be able diurese for now. Stop diuretics for now.  CO-OX stable  55%. Continue milrinone 0.25 mcg + revatio.   Maps improved.  Renal function stable. Continue 2.5 mg lisinopril daily    9/13 Ramp ECHO   AF rate 90s-100s. On po amiodarone.   Ileus: resolved.    NSVT: 9/15 Check Mag/K. K 3.4 today. Need to keep mag >2.0 K > 4   Hip Pain - Has history gout. Increase allopurinol to 300 mg daily + colchicine. Check uric acid level and Sed rate.    Length of Stay: 21  CLEGG,AMY NP-C  01/04/2015 7:25 AM    Patient seen and examined with Darrick Grinder, NP. We discussed all aspects of the encounter. I agree with the assessment and plan as stated above.   Overnight had multiple PI events and was orthostatic. Given  500cc NS. Now feels better. Diuretics on hold (has small LV). Target weight 174-175. Continue milrinone for RV failure. Hip pain likely gout. Will try to avoid steroids. Use colchicine and allopurinol.   Hopefully home Monday. Continue loading coumadin and ambulating. VAD parameters reviewed personally.   Greogory Cornette,MD 8:45 AM

## 2015-01-04 NOTE — Progress Notes (Addendum)
ANTICOAGULATION CONSULT NOTE - Follow Up Consult  Pharmacy Consult for heparin Indication: atrial fibrillation and LVAD  No Known Allergies  Patient Measurements: Height: 5\' 7"  (170.2 cm) Weight: 175 lb 4.3 oz (79.5 kg) IBW/kg (Calculated) : 66.1  Vital Signs: Temp: 98.5 F (36.9 C) (09/16 0419) Temp Source: Oral (09/16 0419) Pulse Rate: 95 (09/16 0419)  Labs:  Recent Labs  01/01/15 1610 01/02/15 0453 01/03/15 0533 01/03/15 0750 01/03/15 1553 01/03/15 2307 01/04/15 0605 01/04/15 0640  HGB 9.2* 7.9*  --  8.4*  --   --  8.1*  --   HCT 27.0* 25.8*  --  27.5*  --   --  25.5*  --   PLT  --  525*  --  539*  --   --  404*  --   LABPROT  --  18.9* 17.8*  --   --   --  17.7*  --   INR  --  1.58* 1.45  --   --   --  1.45  --   HEPARINUNFRC  --   --   --   --  0.10* 0.19*  --  0.33  CREATININE 1.00 1.15  --   --   --   --  1.21  --     Estimated Creatinine Clearance: 77.1 mL/min (by C-G formula based on Cr of 1.21).  Assessment: 44 y/o male on IV heparin to bridge warfarin post LVAD and for Afib. Heparin level is now therapeutic at 0.33 on 1350 units/hr. Had previous epistaxis but no further bleeding reported. Hb and platelets stable.  INR unchanged overnight after increasing dose, will increase dose again tonight but hesitant to increase too much d/t overshooting last time.  Patient was on eliquis pta but had taken coumadin in the distant past. I discussed INR goals and monitoring differences and he felt comfortable with the medication.  Goal of Therapy:  INR goal 2-3 Heparin level 0.3-0.5 units/ml Monitor platelets by anticoagulation protocol: Yes   Plan:  - Increase heparin drip to 1400 units/hr - 6 hr heparin level - Monitor for s/sx of bleeding - Warfarin 6mg  today  Erin Hearing PharmD., BCPS Clinical Pharmacist Pager [phone] 01/04/2015 7:40 AM  Addendum HL continues to be at goal on 1400/hr.   No change in heparin.  01/04/2015 1:52 PM

## 2015-01-04 NOTE — Progress Notes (Signed)
PT Cancellation Note  Patient Details Name: REHAAN PANA MRN: [id number] DOB: 11/23/1970   Cancelled Treatment:    Reason Eval/Treat Not Completed: Medical issues which prohibited therapy. Pt reports the pain in his legs from his gout is still too bad to try walking. States he knows he can't tolerate putting weight on legs    SASSER,LYNN 01/04/2015, 1:58 PM  Pager [phone]

## 2015-01-04 NOTE — Progress Notes (Signed)
Came to ambulate pt but he sts he is in too much pain in his legs to walk, that they are throbbing in bed. Asked [place] to check back in pm. Will f/u. Yves Dill CES, ACSM 11:08 AM 01/04/2015

## 2015-01-05 LAB — CBC
HEMATOCRIT: 27.3 % — AB (ref 39.0–52.0)
Hemoglobin: 8.5 g/dL — ABNORMAL LOW (ref 13.0–17.0)
MCH: 22 pg — AB (ref 26.0–34.0)
MCHC: 31.1 g/dL (ref 30.0–36.0)
MCV: 70.7 fL — AB (ref 78.0–100.0)
PLATELETS: 379 10*3/uL (ref 150–400)
RBC: 3.86 MIL/uL — ABNORMAL LOW (ref 4.22–5.81)
RDW: 24.3 % — AB (ref 11.5–15.5)
WBC: 9.4 10*3/uL (ref 4.0–10.5)

## 2015-01-05 LAB — PROTIME-INR
INR: 1.55 — ABNORMAL HIGH (ref 0.00–1.49)
Prothrombin Time: 18.7 seconds — ABNORMAL HIGH (ref 11.6–15.2)

## 2015-01-05 LAB — LACTATE DEHYDROGENASE: LDH: 283 U/L — ABNORMAL HIGH (ref 98–192)

## 2015-01-05 LAB — HEPARIN LEVEL (UNFRACTIONATED): Heparin Unfractionated: 0.28 IU/mL — ABNORMAL LOW (ref 0.30–0.70)

## 2015-01-05 MED ORDER — WARFARIN SODIUM 5 MG PO TABS
5.0000 mg | ORAL_TABLET | Freq: Once | ORAL | Status: AC
  Administered 2015-01-05: 5 mg via ORAL
  Filled 2015-01-05: qty 1

## 2015-01-05 NOTE — Progress Notes (Signed)
CARDIAC REHAB PHASE I   PRE:  Rate/Rhythm: 93 afib  BP:  Supine:   Sitting: 90 MAP  Standing:    SaO2: 96%RA  MODE:  Ambulation: 150 ft   POST:  Rate/Rhythm: 116 afib  BP:  Supine:   Sitting: 96 MAP  Standing:    SaO2: 96%RA 1440-1520 Pt willing to walk. Walked 150 ft on RA with gait belt use, rollator and asst x 2 due to equipment and leg weakness. Sat once to rest. Pain centralized left leg on left side. Pt stated felt like his gout. Back to bed after walk. Pt switched self to batteries and back to power source with assistance. MAP high after walk but noted he had received hydralazine earlier.   Graylon Good, RN BSN  01/05/2015 3:16 PM

## 2015-01-05 NOTE — Progress Notes (Signed)
ANTICOAGULATION CONSULT NOTE - Follow Up Consult  Pharmacy Consult for Heparin and Coumadin Indication: atrial fibrillation and LVAD  No Known Allergies  Patient Measurements: Height: 5\' 7"  (170.2 cm) Weight: 175 lb 8 oz (79.606 kg) IBW/kg (Calculated) : 66.1  Heparin dosing weight: 79.6 kg  Vital Signs: Temp: 98.2 F (36.8 C) (09/17 0614) Temp Source: Oral (09/17 0614) BP: 105/80 mmHg (09/17 0425) Pulse Rate: 86 (09/17 0614)  Labs:  Recent Labs  01/03/15 0533  01/03/15 0750  01/04/15 0605 01/04/15 0640 01/04/15 1230 01/05/15 0711  HGB  --   < > 8.4*  --  8.1*  --   --  8.5*  HCT  --   --  27.5*  --  25.5*  --   --  27.3*  PLT  --   --  539*  --  404*  --   --  379  LABPROT 17.8*  --   --   --  17.7*  --   --  18.7*  INR 1.45  --   --   --  1.45  --   --  1.55*  HEPARINUNFRC  --   --   --   < >  --  0.33 0.45 0.28*  CREATININE  --   --   --   --  1.21  --   --   --   CKTOTAL  --   --   --   --  31*  --   --   --   < > = values in this interval not displayed.  Estimated Creatinine Clearance: 77.1 mL/min (by C-G formula based on Cr of 1.21).  Assessment: 44 y/o male on IV heparin to bridge warfarin post LVAD and for Afib.  Heparin level is just below therapeutic at 0.28 on 1400 units/hr. INR 1.45>1.55 after Coumadin 4 mg on 9/15 and 6 mg on 9/16. Hesitant to increase dose d/t overshooting last week (INR to 4.45) and epistaxis that required FFP and packing. No further bleeding reported. Hgb and platelets stable.  On Amiodarone.  Patient was on Eliquis pta but had taken coumadin in the distant past.   Goal of Therapy:  INR goal 2-3 Heparin level 0.3-0.5 units/ml Monitor platelets by anticoagulation protocol: Yes   Plan:   Increase heparin drip to 1450 units/hr  Coumadin 5 mg x 1 today  Daily heparin level, PT/INR and CBC.  Monitor for s/sx of bleeding   Arty Baumgartner, [place] Pager: [phone] 01/05/2015 2:12 PM

## 2015-01-05 NOTE — Progress Notes (Signed)
MAP with doppler reading 102.  PRN hydralzine given, 10mg  per order.  Pt is without complaints, eating lunch in bed.  Will con't to monitor.

## 2015-01-05 NOTE — Progress Notes (Signed)
Patient ID: [name], male   DOB: 1970-07-16, 44 y.o.   MRN: [id number]           VAD Team Rounding Note  Subjective:    s/p HMII implant 9/1  9/6 Milrinone cut back to 0.125 mcg.  VAD was increased to 9200.  9/7 Continued on milrinone 0.125 mcg. INR 4.4 Transferred to 2W 9/8 Developed epitaxis. Coughing up clots. INR 4.23 Given FFP.  CXR ok ENT consulted.  9/11 INR 4.28 Received 2 FFP 9/12 INR today 2.56 Hgb 7.3 received 1UPRBCs. Amio transitioned to po. CO-OX 47%. Milrinone increased to 0.25 mcg.  9/13 INR 1.6 CO-OX 56% on milrinone 0.25 mcg. Transferred to 2W 9/13 Ramp echo speed maintained at 9200 9/15 NSVT   Remains on milrinone 0.25 mcg as well as Revatio for RV failure.  Less joint pain today, ?gout flare.  Did not walk yesterday because of pain, thinks he can walk today.    Got IV fluid yesterday as he was orthostatic with lots of PI events => few PI events since then.    LVAD INTERROGATION:  HeartMate II LVAD:  Flow 4.0 liters/min, speed 9200, power 4.8,  PI 8.2.  4 PI events in the last 24 hours. .      Objective:    Vital Signs:   Temp:  [98 F (36.7 C)-98.2 F (36.8 C)] 98.2 F (36.8 C) (09/17 0614) Pulse Rate:  [77-94] 86 (09/17 0614) Resp:  [16-18] 16 (09/17 0614) BP: (97-111)/(77-94) 105/80 mmHg (09/17 0425) SpO2:  [98 %-100 %] 98 % (09/17 0614) Weight:  [175 lb 8 oz (79.606 kg)] 175 lb 8 oz (79.606 kg) (09/17 0614) Last BM Date: 01/04/15 Mean arterial Pressure 80s  Intake/Output:   Intake/Output Summary (Last 24 hours) at 01/05/15 0918 Last data filed at 01/04/15 1500  Gross per 24 hour  Intake      0 ml  Output    600 ml  Net   -600 ml     Physical Exam:  General: In bed.  NAD  HEENT: normal .  Neck: supple. JVP to jaw Cor: Sternal incision approximated.   Mechanical heart sounds with LVAD hum present. Lungs: Clear   Abdomen: soft, nontender, nondistended. + bowel sounds. Driveline: C/D/I; securement device intact and driveline  incorporated Extremities: no cyanosis, clubbing, rash, no edema. R and LLE SCDs. R and L pedal pulse 3+  Neuro: awake conversant [place] affect    Telemetry: AF 80-90s   Labs: Basic Metabolic Panel:  Recent Labs Lab 12/30/14 0413 12/31/14 0413 01/01/15 0405 01/01/15 1610 01/02/15 0453 01/04/15 0605 01/04/15 1000  NA 140 140 138 140 136 136  --   K 3.9 3.8 3.7 3.5 4.3 3.4*  --   CL 106 104 103 106 105 101  --   CO2 26 30 28   --  26 28  --   GLUCOSE 105* 98 107* 111* 95 90  --   BUN 20 23* 18 14 14 14   --   CREATININE 1.25* 1.35* 1.22 1.00 1.15 1.21  --   CALCIUM 8.8* 8.6* 8.2*  --  8.3* 8.3*  --   MG  --   --   --   --   --   --  1.6*    Liver Function Tests: No results for input(s): AST, ALT, ALKPHOS, BILITOT, PROT, ALBUMIN in the last 168 hours. No results for input(s): LIPASE, AMYLASE in the last 168 hours. No results for input(s): AMMONIA in the last  168 hours.  CBC:  Recent Labs Lab 01/01/15 0405 01/01/15 1610 01/02/15 0453 01/03/15 0750 01/04/15 0605 01/05/15 0711  WBC 7.5  --  9.1 9.5 11.2* 9.4  HGB 8.2* 9.2* 7.9* 8.4* 8.1* 8.5*  HCT 25.9* 27.0* 25.8* 27.5* 25.5* 27.3*  MCV 70.0*  --  69.2* 69.6* 69.3* 70.7*  PLT 525*  --  525* 539* 404* 379    INR:  Recent Labs Lab 01/01/15 0405 01/02/15 0453 01/03/15 0533 01/04/15 0605 01/05/15 0711  INR 1.67* 1.58* 1.45 1.45 1.55*    Other results:   Imaging: No results found.   Medications:     Scheduled Medications: . allopurinol  300 mg Oral Daily  . amiodarone  200 mg Oral BID  . antiseptic oral rinse  7 mL Mouth Rinse QID  . bisacodyl  10 mg Oral Daily   Or  . bisacodyl  10 mg Rectal Daily  . citalopram  20 mg Oral Daily  . digoxin  0.125 mg Oral Daily  . feeding supplement (ENSURE ENLIVE)  237 mL Oral TID BM  . metoCLOPramide (REGLAN) injection  10 mg Intravenous 4 times per day  . pantoprazole  40 mg Oral Daily  . sildenafil  20 mg Oral TID  . sodium chloride  10-40 mL Intracatheter  Q12H  . sodium chloride  3 mL Intravenous Q12H  . Warfarin - Pharmacist Dosing Inpatient   Does not apply q1800    Infusions: . sodium chloride 10 mL/hr at 01/01/15 1600  . sodium chloride 250 mL (01/01/15 1600)  . heparin 1,400 Units/hr (01/05/15 0425)  . lactated ringers Stopped (12/20/14 2000)  . lactated ringers Stopped (12/22/14 0600)  . milrinone 0.25 mcg/kg/min (01/05/15 0802)    PRN Medications: hydrALAZINE, lidocaine, lidocaine, lidocaine-EPINEPHrine, morphine injection, ondansetron (ZOFRAN) IV, oxyCODONE, oxymetazoline, silver nitrate applicators, sodium chloride, sodium chloride, sodium chloride, traMADol, TRIPLE ANTIBIOTIC, zolpidem   Assessment:   1. Cardiogenic shock s/p HMII implant 12/20/2014 2. Acute/chonic systolic HF   -- NICM EF 0000000 3. Chronic AF with RVR 4. Expected EBL anemia 5. Acute on chronic renal failure 6. PNA  7. Anemia  8. Epistaxis: right nasal packing.  9. Hemoptysis 10. Ileus: Resolving 11. NSVT  12 Bilateral Hip Pain with history of gout: improving.  Plan/Discussion:   S/P HMII LVAD implant on 9/1   Epistaxis, resolved. Packing removed.   INR 1.55 => heparin gtt + warfarin until INR 2.  Hemoglobin stable today at 8.5.   Volume status elevated but has prominent RV failure. Continue milrinone 0.25 mcg + Revatio.  Gentle fluid + holding diuretics yesterday, fewer PI events over last 24 hours.  Will aim for weight around 175 for now.   MAP stable.  Renal function stable. Continue 2.5 mg lisinopril daily   AF rate 80s-100s. On po amiodarone for rate control.   Ileus: resolved.    Joint pain better, uric acid actually not high despite history of gout.  Needs to walk today.    Hopefully home Monday. Continue loading coumadin and ambulating. VAD parameters reviewed personally.   Loralie Champagne 01/05/2015 9:23 AM

## 2015-01-06 LAB — PROTIME-INR
INR: 2.02 — AB (ref 0.00–1.49)
PROTHROMBIN TIME: 22.7 s — AB (ref 11.6–15.2)

## 2015-01-06 LAB — CBC
HEMATOCRIT: 25 % — AB (ref 39.0–52.0)
HEMOGLOBIN: 8 g/dL — AB (ref 13.0–17.0)
MCH: 22.2 pg — ABNORMAL LOW (ref 26.0–34.0)
MCHC: 32 g/dL (ref 30.0–36.0)
MCV: 69.3 fL — ABNORMAL LOW (ref 78.0–100.0)
Platelets: 325 10*3/uL (ref 150–400)
RBC: 3.61 MIL/uL — ABNORMAL LOW (ref 4.22–5.81)
RDW: 24 % — ABNORMAL HIGH (ref 11.5–15.5)
WBC: 9.1 10*3/uL (ref 4.0–10.5)

## 2015-01-06 LAB — BASIC METABOLIC PANEL
Anion gap: 7 (ref 5–15)
BUN: 10 mg/dL (ref 6–20)
CHLORIDE: 105 mmol/L (ref 101–111)
CO2: 24 mmol/L (ref 22–32)
Calcium: 8.5 mg/dL — ABNORMAL LOW (ref 8.9–10.3)
Creatinine, Ser: 1.03 mg/dL (ref 0.61–1.24)
GFR calc non Af Amer: >60 mL/min (ref >60)
Glucose, Bld: 87 mg/dL (ref 65–99)
POTASSIUM: 4.6 mmol/L (ref 3.5–5.1)
SODIUM: 136 mmol/L (ref 135–145)

## 2015-01-06 LAB — LACTATE DEHYDROGENASE: LDH: 243 U/L — AB (ref 98–192)

## 2015-01-06 LAB — HEPARIN LEVEL (UNFRACTIONATED): Heparin Unfractionated: 0.34 IU/mL (ref 0.30–0.70)

## 2015-01-06 MED ORDER — WARFARIN SODIUM 2.5 MG PO TABS
2.5000 mg | ORAL_TABLET | Freq: Once | ORAL | Status: AC
  Administered 2015-01-06: 2.5 mg via ORAL
  Filled 2015-01-06: qty 1

## 2015-01-06 MED ORDER — COLCHICINE 0.6 MG PO TABS
0.6000 mg | ORAL_TABLET | Freq: Every day | ORAL | Status: DC
  Administered 2015-01-06 – 2015-01-08 (×3): 0.6 mg via ORAL
  Filled 2015-01-06 (×3): qty 1

## 2015-01-06 MED ORDER — HYDRALAZINE HCL 25 MG PO TABS
25.0000 mg | ORAL_TABLET | Freq: Three times a day (TID) | ORAL | Status: DC
  Administered 2015-01-06 – 2015-01-07 (×3): 25 mg via ORAL
  Filled 2015-01-06 (×3): qty 1

## 2015-01-06 NOTE — Progress Notes (Signed)
Pt ambulated 300 ft again, using rollator.  Switching from [facility] to batteries and back independently without incident.  Moving well, no complaints.  To bed after walk with call bell in reach.  Will con't plan of care.

## 2015-01-06 NOTE — Progress Notes (Signed)
ANTICOAGULATION CONSULT NOTE - Follow Up Consult  Pharmacy Consult for Coumadin Indication: atrial fibrillation and LVAD  No Known Allergies  Patient Measurements: Height: 5\' 7"  (170.2 cm) Weight: 175 lb 1.6 oz (79.425 kg) IBW/kg (Calculated) : 66.1  Heparin dosing weight: 79.6 kg  Vital Signs: Temp: 98.1 F (36.7 C) (09/18 0442) Temp Source: Oral (09/18 0442) BP: 115/87 mmHg (09/18 0442) Pulse Rate: 87 (09/18 0442)  Labs:  Recent Labs  01/04/15 0605  01/04/15 1230 01/05/15 0711 01/06/15 0425  HGB 8.1*  --   --  8.5* 8.0*  HCT 25.5*  --   --  27.3* 25.0*  PLT 404*  --   --  379 325  LABPROT 17.7*  --   --  18.7* 22.7*  INR 1.45  --   --  1.55* 2.02*  HEPARINUNFRC  --   < > 0.45 0.28* 0.34  CREATININE 1.21  --   --   --  1.03  CKTOTAL 31*  --   --   --   --   < > = values in this interval not displayed.  Estimated Creatinine Clearance: 90.5 mL/min (by C-G formula based on Cr of 1.03).  Assessment: 44 y/o male on IV heparin to bridge warfarin post LVAD and for Afib.  Heparin level therapeutic this morning (0.34) on 1450 units/hr. INR 1.45>1.55>2.02 after Coumadin 4 mg, 6 mg, then 5 mg over the last 3 days. Expect some further rise in INR by am. Hx sensitive to Coumadin doses last week (INR to 4.45) and epistaxis that required FFP and packing. No further bleeding reported. Hgb and platelets stable.  On Amiodarone.  Patient was on Eliquis pta but had taken coumadin in the distant past.   Goal of Therapy:  INR 2-3 Monitor platelets by anticoagulation protocol: Yes   Plan:   Heparin drip discontinued this morning.  Decrease Coumadin to 2.5 mg x 1 today.   PT/INR and CBC in am.  Monitor for s/sx of bleeding  Arty Baumgartner, [place] Pager: [phone] 01/06/2015 1:35 PM

## 2015-01-06 NOTE — Progress Notes (Signed)
Patient ID: [name], male   DOB: 06-17-1970, 44 y.o.   MRN: [id number]           VAD Team Rounding Note  Subjective:    s/p HMII implant 9/1  9/6 Milrinone cut back to 0.125 mcg.  VAD was increased to 9200.  9/7 Continued on milrinone 0.125 mcg. INR 4.4 Transferred to 2W 9/8 Developed epitaxis. Coughing up clots. INR 4.23 Given FFP.  CXR ok ENT consulted.  9/11 INR 4.28 Received 2 FFP 9/12 INR today 2.56 Hgb 7.3 received 1UPRBCs. Amio transitioned to po. CO-OX 47%. Milrinone increased to 0.25 mcg.  9/13 INR 1.6 CO-OX 56% on milrinone 0.25 mcg. Transferred to 2W 9/13 Ramp echo speed maintained at 9200 9/15 NSVT   Remains on milrinone 0.25 mcg as well as Revatio for RV failure.  Still with right knee pain and hip pain, ?gout flare.  Walked once yesterday.  Using oxycodone.    Stable weight today, no diuretics.  No dyspnea.   INR 2 LDH 243  LVAD INTERROGATION:  HeartMate II LVAD:  Flow 4.9 liters/min, speed 9200, power 5.4,  PI 7.  6 PI events in the last 24 hours. .      Objective:    Vital Signs:   Temp:  [98.1 F (36.7 C)-99.6 F (37.6 C)] 98.1 F (36.7 C) (09/18 0442) Pulse Rate:  [84-96] 87 (09/18 0442) Resp:  [16-18] 16 (09/18 0442) BP: (97-115)/(71-87) 115/87 mmHg (09/18 0442) SpO2:  [96 %-98 %] 98 % (09/18 0442) Weight:  [175 lb 1.6 oz (79.425 kg)] 175 lb 1.6 oz (79.425 kg) (09/18 0442) Last BM Date: 01/06/15 Mean arterial Pressure 80s  Intake/Output:   Intake/Output Summary (Last 24 hours) at 01/06/15 0947 Last data filed at 01/06/15 0700  Gross per 24 hour  Intake    600 ml  Output      0 ml  Net    600 ml     Physical Exam:  General: In bed.  NAD  HEENT: normal .  Neck: supple. JVP 10 cm.  Cor: Sternal incision approximated.   Mechanical heart sounds with LVAD hum present. Lungs: Clear   Abdomen: soft, nontender, nondistended. + bowel sounds. Driveline: C/D/I; securement device intact and driveline incorporated Extremities: no cyanosis,  clubbing, rash.  1+ ankle edema.  Neuro: awake, alert, normal affect   Telemetry: AF 80-90s   Labs: Basic Metabolic Panel:  Recent Labs Lab 12/31/14 0413 01/01/15 0405 01/01/15 1610 01/02/15 0453 01/04/15 0605 01/04/15 1000 01/06/15 0425  NA 140 138 140 136 136  --  136  K 3.8 3.7 3.5 4.3 3.4*  --  4.6  CL 104 103 106 105 101  --  105  CO2 30 28  --  26 28  --  24  GLUCOSE 98 107* 111* 95 90  --  87  BUN 23* 18 14 14 14   --  10  CREATININE 1.35* 1.22 1.00 1.15 1.21  --  1.03  CALCIUM 8.6* 8.2*  --  8.3* 8.3*  --  8.5*  MG  --   --   --   --   --  1.6*  --     Liver Function Tests: No results for input(s): AST, ALT, ALKPHOS, BILITOT, PROT, ALBUMIN in the last 168 hours. No results for input(s): LIPASE, AMYLASE in the last 168 hours. No results for input(s): AMMONIA in the last 168 hours.  CBC:  Recent Labs Lab 01/02/15 0453 01/03/15 0750 01/04/15 [id number] 01/05/15 [id number]  01/06/15 0425  WBC 9.1 9.5 11.2* 9.4 9.1  HGB 7.9* 8.4* 8.1* 8.5* 8.0*  HCT 25.8* 27.5* 25.5* 27.3* 25.0*  MCV 69.2* 69.6* 69.3* 70.7* 69.3*  PLT 525* 539* 404* 379 325    INR:  Recent Labs Lab 01/02/15 0453 01/03/15 0533 01/04/15 0605 01/05/15 0711 01/06/15 0425  INR 1.58* 1.45 1.45 1.55* 2.02*    Other results:   Imaging: No results found.   Medications:     Scheduled Medications: . allopurinol  300 mg Oral Daily  . amiodarone  200 mg Oral BID  . antiseptic oral rinse  7 mL Mouth Rinse QID  . bisacodyl  10 mg Oral Daily   Or  . bisacodyl  10 mg Rectal Daily  . citalopram  20 mg Oral Daily  . colchicine  0.6 mg Oral Daily  . digoxin  0.125 mg Oral Daily  . feeding supplement (ENSURE ENLIVE)  237 mL Oral TID BM  . pantoprazole  40 mg Oral Daily  . sildenafil  20 mg Oral TID  . sodium chloride  10-40 mL Intracatheter Q12H  . sodium chloride  3 mL Intravenous Q12H  . Warfarin - Pharmacist Dosing Inpatient   Does not apply q1800    Infusions: . sodium chloride 10 mL/hr  at 01/01/15 1600  . sodium chloride 250 mL (01/01/15 1600)  . lactated ringers Stopped (12/20/14 2000)  . lactated ringers Stopped (12/22/14 0600)  . milrinone 0.25 mcg/kg/min (01/05/15 2349)    PRN Medications: hydrALAZINE, lidocaine, lidocaine, lidocaine-EPINEPHrine, morphine injection, ondansetron (ZOFRAN) IV, oxyCODONE, oxymetazoline, sodium chloride, sodium chloride, sodium chloride, traMADol, TRIPLE ANTIBIOTIC, zolpidem   Assessment:   1. Cardiogenic shock s/p HMII implant 12/20/2014 2. Acute/chonic systolic HF   -- NICM EF 0000000 3. Chronic AF with RVR 4. Expected EBL anemia 5. Acute on chronic renal failure 6. PNA  7. Anemia  8. Epistaxis: right nasal packing.  9. Hemoptysis 10. Ileus: Resolving 11. NSVT  12  Knee and hip pain with history of gout.  Plan/Discussion:   S/P HMII LVAD implant on 9/1   Epistaxis, resolved. Packing removed.   INR > 2, stop heparin gtt.  Off ASA for now with severe nose bleed.    Volume status elevated but has prominent RV failure. Continue milrinone 0.25 mcg + Revatio.  Weight stable at 175, can continue off diuretics for now (recently orthostatic).  Few PI events yesterday.   MAP stable with one elevated reading this morning in setting of pain.  Will recheck.    Renal function stable.   AF rate 80s-100s. On po amiodarone for rate control.   Ileus: resolved, can stop Reglan.    Still having lots of knee pain.  Has history of gout, will add colchicine today to see if this helps.    Hopefully home Monday. Continue loading coumadin and ambulating. VAD parameters reviewed personally.   Loralie Champagne 01/06/2015 9:47 AM

## 2015-01-06 NOTE — Discharge Summary (Signed)
Advanced Heart Failure Team  Discharge Summary   Patient ID: [name] MRN: [id number], DOB/AGE: 46-Jun-1972 46 y.o. Admit date: 12/14/2014 D/C date:     01/08/2015   Primary Discharge Diagnoses:  1. Cardiogenic shock s/p HMII implant under DT --> 12/20/2014 with clipping of left atrial appendage    LVAD Fixed Speed 9200. Off aspirin--. epistaxis. On coumadin 2. Acute/chonic systolic HF    -- NICM EF 20-25% 3. Chronic AF with RVR- amiodarone 200 mg twice daily  4. Expected EBL anemia 5. Acute on chronic renal failure-      --8/26 Peak Creatinine 2.46 --> on discharge 1.12  6. PNA  7. Epistaxis: right nasal packing.--> 9/9 Hgb 7.3  1UPRBC-->on discharge Hgb 8.0  8 Hemoptysis-resolved  9. Ileus: resolved 10. NSVT  11 Knee and hip pain with history of gout. 12. 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 Off b-blocker 13. Chronic Anticoagulation- Coumadin INR Goal 2-3   Hospital Course:  [name] is a 44 y.o. 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  Admitted 12/14/14 with increased dyspnea and suspected low output heart failure. He was taken to the cath lab for [place] showed profound cardiogenic shock and elevated filling pressures. IABP was placed and started on inotropes. Hemodynamics improved with IABP and dual inotropes. Creatinine on admit was 2.41 but steadily improved with IABP and dual pressors.    Due to the severity of his heart failure he was worked up for LVAD HMII with consultations completed by cardiac surgery, palliative care, social work and [place] coordinators. Once the work up was complete he was deemed appropriate for LVAD HMII. He was presented to the transplant team at [facility] and given approval for LVAD HMII under DT criteria with plan to pursue transplant once stabilized.   On 12/20/2014 he underwent LVAD HMII with clipping of  atrial appendage. Postoperatively he developed A fib RVR was loaded on amiodarone. As he improved all drips weaned off however we were unable to wean milrinone due to low mixed venous saturations.   On 9/9 he developed epistaxis, hemoptysis, and nausea. INR at that time was 4.23 and he received 2 Units of FFP.  ENT consulted, with nose packing inserted. Coumadin and aspirin stopped. Once INR drifted down and epistaxis resolved. coumadin was carefully restarted. He had expected post op anemia that was worsened by epistaxis. Hemoglobin dropped to 7.3 mixed venous saturation went down to 47% so he was given 1URBCs. Blood products limited due to potential transplant in the future.  He developed an ileus and required a few days of bowel rest. He later tolerated a diet without difficulty. Treated for suspected pneumonia with antibiotic course of vancomycin and zosyn.    On 9/13 he had a Ramp ECHO with fixed speed to continue at 9200. PA pressures elevated so he was started on revatio 20 mg tid. He and his sisters received extensive education on LVAD. They showed sufficient skill to be discharged home.   He was discharged in stable condition off  diuretics due to orthostatic hypotension but may need to add carefully as an outpatient with RF failure. Plan to continue hydralazine and digoxin but no nitrate with sildenafil.   He will continue to be followed closely in the LVAD clinic. Will need to consider diuretic as well as aspirin at first post operative  appointment (01/14/2015).  [facility] to follow for home milrinone at 0.25 mcgweekly labs, HHRN, HHOT, and HHOT   LVAD INTERROGATION:  HeartMate II LVAD: Flow 4.2 liters/min, speed 9180, power 5, PI 8.2   Procedures RHC 12/14/2014 RA = 21  RV = 45/6/13 PA = 51/31 (40) PCW = 34 Fick cardiac output/index = 1.8/0.9 PVR = 3.5 WU FA sat = 99% PA sat = 20%, 25% Discharge Weight Range: 171 pounds.  Discharge Vitals: Blood pressure 116/82, pulse 105,  temperature 99.5 F (37.5 C), temperature source Oral, resp. rate 18, height 5\' 7"  (1.702 m), weight 171 lb 3.2 oz (77.656 kg), SpO2 97 %.  Labs: Lab Results  Component Value Date   WBC 9.7 01/08/2015   HGB 8.0* 01/08/2015   HCT 26.2* 01/08/2015   MCV 68.8* 01/08/2015   PLT 324 01/08/2015     Recent Labs Lab 01/08/15 0438  NA 137  K 5.2*  CL 105  CO2 24  BUN 13  CREATININE 1.12  CALCIUM 9.0  GLUCOSE 89   Lab Results  Component Value Date   CHOL 111 12/16/2014   HDL 36* 12/16/2014   LDLCALC 62 12/16/2014   TRIG 65 12/16/2014   BNP (last 3 results)  Recent Labs  12/14/14 1359 12/21/14 0309 12/27/14 0047  BNP 1473.6* 347.0* 781.0*    ProBNP (last 3 results)  Recent Labs  12/10/14 1214  PROBNP 1807.0*     Diagnostic Studies/Procedures   Dg Chest Port 1 View  01/07/2015   CLINICAL DATA:  Chest pain, with left ventricular assist device  EXAM: PORTABLE CHEST - 1 VIEW  COMPARISON:  01/01/2015  FINDINGS: Severe cardiac enlargement. Defibrillator in unchanged position as is right PICC line. Left ventricular assist device in unchanged position.  Mild central vascular congestion and mild interstitial prominence with no definite pulmonary edema. There is left lower lobe extensive opacity suggesting pleural effusion and consolidation.  IMPRESSION: Slightly increased left lower lobe opacities suggesting combination of pleural effusion and consolidation.   Electronically Signed   By: Skipper Cliche M.D.   On: 01/07/2015 09:18    Discharge Medications     Medication List    STOP taking these medications        apixaban 5 MG Tabs tablet  Commonly known as:  ELIQUIS     carvedilol 25 MG tablet  Commonly known as:  COREG     spironolactone 25 MG tablet  Commonly known as:  ALDACTONE      TAKE these medications        allopurinol 300 MG tablet  Commonly known as:  ZYLOPRIM  Take 1 tablet (300 mg total) by mouth daily.     amiodarone 200 MG tablet  Commonly  known as:  PACERONE  Take 1 tablet (200 mg total) by mouth 2 (two) times daily.     citalopram 20 MG tablet  Commonly known as:  CELEXA  Take 1 tablet (20 mg total) by mouth daily.     digoxin 0.125 MG tablet  Commonly known as:  LANOXIN  Take 1 tablet (0.125 mg total) by mouth daily.     furosemide 40 MG tablet  Commonly known as:  LASIX  Take 1 tablet (40 mg total) by mouth as needed.     hydrALAZINE 50 MG tablet  Commonly known as:  APRESOLINE  Take 1 tablet (50 mg total) by mouth every 8 (eight) hours.     milrinone 20 MG/100ML Soln infusion  Commonly known as:  PRIMACOR  Inject 19.65 mcg/min into the vein continuous.     oxyCODONE 5 MG immediate release tablet  Commonly known as:  Oxy IR/ROXICODONE  Take 1-2 tablets (5-10 mg total) by mouth every 6 (six) hours as needed for severe pain.     oxymetazoline 0.05 % nasal spray  Commonly known as:  AFRIN  Place 1 spray into both nostrils 2 (two) times daily as needed for congestion (May use decongestant if actively bleeding).     pantoprazole 40 MG tablet  Commonly known as:  PROTONIX  Take 1 tablet (40 mg total) by mouth daily.     pregabalin 75 MG capsule  Commonly known as:  LYRICA  Take 1 capsule (75 mg total) by mouth 2 (two) times daily.     sildenafil 20 MG tablet  Commonly known as:  REVATIO  Take 1 tablet (20 mg total) by mouth 3 (three) times daily.     warfarin 2 MG tablet  Commonly known as:  COUMADIN  Take 2 mg daily starting 01/09/15 and adjust as needed per [facility]     zolpidem 5 MG tablet  Commonly known as:  AMBIEN  Take 1 tablet (5 mg total) by mouth at bedtime as needed for sleep.        Disposition   The patient will be discharged in stable condition to home. Discharge Instructions    Contraindication to ACEI at discharge    Complete by:  As directed      Diet - low sodium heart healthy    Complete by:  As directed      Heart Failure patients record your daily weight using the same  scale at the same time of day    Complete by:  As directed      Increase activity slowly    Complete by:  As directed               Duration of Discharge Encounter: Greater than 35 minutes   Signed, CLEGG,AMY NP-C  01/08/2015, 1:24 PM   Patient seen and examined with Darrick Grinder, NP. We discussed all aspects of the encounter. I agree with the assessment and plan as stated above. [facility] for d/c today.   Sunshine Mackowski,MD 11:14 PM

## 2015-01-06 NOTE — Progress Notes (Signed)
Pt ambulated 300 ft using RW, without complaints of pain, SOB, or dizziness.  Gait slow and steady, good posture.  Able to switch to batteries for walk and back to [facility] power independently.  To bed after walk with call bell in reach, will con't plan of care.

## 2015-01-07 ENCOUNTER — Inpatient Hospital Stay (HOSPITAL_COMMUNITY): Payer: 59

## 2015-01-07 LAB — BASIC METABOLIC PANEL
Anion gap: 7 (ref 5–15)
BUN: 9 mg/dL (ref 6–20)
CHLORIDE: 107 mmol/L (ref 101–111)
CO2: 24 mmol/L (ref 22–32)
Calcium: 8.9 mg/dL (ref 8.9–10.3)
Creatinine, Ser: 1.08 mg/dL (ref 0.61–1.24)
GFR calc non Af Amer: >60 mL/min (ref >60)
Glucose, Bld: 94 mg/dL (ref 65–99)
POTASSIUM: 4.1 mmol/L (ref 3.5–5.1)
SODIUM: 138 mmol/L (ref 135–145)

## 2015-01-07 LAB — PROTIME-INR
INR: 2.06 — ABNORMAL HIGH (ref 0.00–1.49)
Prothrombin Time: 23.1 seconds — ABNORMAL HIGH (ref 11.6–15.2)

## 2015-01-07 LAB — CBC
HEMATOCRIT: 25.9 % — AB (ref 39.0–52.0)
Hemoglobin: 7.9 g/dL — ABNORMAL LOW (ref 13.0–17.0)
MCH: 21.2 pg — ABNORMAL LOW (ref 26.0–34.0)
MCHC: 30.5 g/dL (ref 30.0–36.0)
MCV: 69.6 fL — AB (ref 78.0–100.0)
PLATELETS: 326 10*3/uL (ref 150–400)
RBC: 3.72 MIL/uL — AB (ref 4.22–5.81)
RDW: 24.1 % — ABNORMAL HIGH (ref 11.5–15.5)
WBC: 8.2 10*3/uL (ref 4.0–10.5)

## 2015-01-07 LAB — C-REACTIVE PROTEIN: CRP: 15.5 mg/dL — ABNORMAL HIGH (ref <1.0)

## 2015-01-07 LAB — LACTATE DEHYDROGENASE: LDH: 234 U/L — ABNORMAL HIGH (ref 98–192)

## 2015-01-07 MED ORDER — HYDRALAZINE HCL 25 MG PO TABS
37.5000 mg | ORAL_TABLET | Freq: Three times a day (TID) | ORAL | Status: DC
  Administered 2015-01-07 – 2015-01-08 (×3): 37.5 mg via ORAL
  Filled 2015-01-07 (×5): qty 2

## 2015-01-07 MED ORDER — WARFARIN SODIUM 4 MG PO TABS
4.0000 mg | ORAL_TABLET | Freq: Once | ORAL | Status: AC
  Administered 2015-01-07: 4 mg via ORAL
  Filled 2015-01-07: qty 1

## 2015-01-07 MED ORDER — PREGABALIN 75 MG PO CAPS
75.0000 mg | ORAL_CAPSULE | Freq: Two times a day (BID) | ORAL | Status: DC
  Administered 2015-01-07 – 2015-01-08 (×4): 75 mg via ORAL
  Filled 2015-01-07 (×4): qty 1

## 2015-01-07 NOTE — Progress Notes (Signed)
Patient ID: [name], male   DOB: 1971/03/27, 44 y.o.   MRN: [id number]           VAD Team Rounding Note  Subjective:    s/p HMII implant 9/1  9/6 Milrinone cut back to 0.125 mcg.  VAD was increased to 9200.  9/7 Continued on milrinone 0.125 mcg. INR 4.4 Transferred to 2W 9/8 Developed epitaxis. Coughing up clots. INR 4.23 Given FFP.  CXR ok ENT consulted.  9/11 INR 4.28 Received 2 FFP 9/12 INR today 2.56 Hgb 7.3 received 1UPRBCs. Amio transitioned to po. CO-OX 47%. Milrinone increased to 0.25 mcg.  9/13 INR 1.6 CO-OX 56% on milrinone 0.25 mcg. Transferred to 2W 9/13 Ramp echo speed maintained at 9200 9/15 NSVT   Remains on milrinone 0.25 mcg as well as Revatio for RV failure.  Knee and hip pain improved. Now with R upper chest and bilateral collar bone pain. Weight down 3 pounds and off all diuretics.    INR 2.06 LDH 234  LVAD INTERROGATION:  HeartMate II LVAD:  Flow 4.2 liters/min, speed 9180, power 5,  PI  8.4      Objective:    Vital Signs:   Temp:  [98.2 F (36.8 C)-98.9 F (37.2 C)] 98.2 F (36.8 C) (09/19 0516) Pulse Rate:  [85-93] 85 (09/19 0516) Resp:  [16-18] 18 (09/19 0516) BP: (109-122)/(71-86) 122/86 mmHg (09/19 0516) SpO2:  [97 %-98 %] 97 % (09/19 0516) Weight:  [172 lb 4.8 oz (78.155 kg)] 172 lb 4.8 oz (78.155 kg) (09/19 0516) Last BM Date: 01/06/15 Mean arterial Pressure 80s  Intake/Output:   Intake/Output Summary (Last 24 hours) at 01/07/15 0828 Last data filed at 01/06/15 1300  Gross per 24 hour  Intake    250 ml  Output      0 ml  Net    250 ml     Physical Exam:  General: In bed.  NAD  HEENT: normal .  Neck: supple. JVP 9-10 cm.  Cor: Sternal incision approximated.   Mechanical heart sounds with LVAD hum present. R upper chest pain -tender to touch.  Lungs: Clear   Abdomen: soft, nontender, nondistended. + bowel sounds. Driveline: C/D/I; securement device intact and driveline incorporated Extremities: no cyanosis, clubbing, rash.   Trace-1+ ankle edema.  Neuro: awake, alert, normal affect   Telemetry: AF 80-90s   Labs: Basic Metabolic Panel:  Recent Labs Lab 01/01/15 0405 01/01/15 1610 01/02/15 0453 01/04/15 0605 01/04/15 1000 01/06/15 0425 01/07/15 0517  NA 138 140 136 136  --  136 138  K 3.7 3.5 4.3 3.4*  --  4.6 4.1  CL 103 106 105 101  --  105 107  CO2 28  --  26 28  --  24 24  GLUCOSE 107* 111* 95 90  --  87 94  BUN $Re'18 14 14 14  'Pti$ --  10 9  CREATININE 1.22 1.00 1.15 1.21  --  1.03 1.08  CALCIUM 8.2*  --  8.3* 8.3*  --  8.5* 8.9  MG  --   --   --   --  1.6*  --   --     Liver Function Tests: No results for input(s): AST, ALT, ALKPHOS, BILITOT, PROT, ALBUMIN in the last 168 hours. No results for input(s): LIPASE, AMYLASE in the last 168 hours. No results for input(s): AMMONIA in the last 168 hours.  CBC:  Recent Labs Lab 01/03/15 0750 01/04/15 0605 01/05/15 0711 01/06/15 0425 01/07/15 0517  WBC 9.5  11.2* 9.4 9.1 8.2  HGB 8.4* 8.1* 8.5* 8.0* 7.9*  HCT 27.5* 25.5* 27.3* 25.0* 25.9*  MCV 69.6* 69.3* 70.7* 69.3* 69.6*  PLT 539* 404* 379 325 326    INR:  Recent Labs Lab 01/03/15 0533 01/04/15 0605 01/05/15 0711 01/06/15 0425 01/07/15 0517  INR 1.45 1.45 1.55* 2.02* 2.06*    Other results:   Imaging: No results found.   Medications:     Scheduled Medications: . allopurinol  300 mg Oral Daily  . amiodarone  200 mg Oral BID  . antiseptic oral rinse  7 mL Mouth Rinse QID  . bisacodyl  10 mg Oral Daily   Or  . bisacodyl  10 mg Rectal Daily  . citalopram  20 mg Oral Daily  . colchicine  0.6 mg Oral Daily  . digoxin  0.125 mg Oral Daily  . feeding supplement (ENSURE ENLIVE)  237 mL Oral TID BM  . hydrALAZINE  25 mg Oral 3 times per day  . pantoprazole  40 mg Oral Daily  . sildenafil  20 mg Oral TID  . sodium chloride  10-40 mL Intracatheter Q12H  . sodium chloride  3 mL Intravenous Q12H  . Warfarin - Pharmacist Dosing Inpatient   Does not apply q1800     Infusions: . sodium chloride 4 mL/hr at 01/06/15 1356  . sodium chloride 250 mL (01/01/15 1600)  . lactated ringers Stopped (12/20/14 2000)  . lactated ringers Stopped (12/22/14 0600)  . milrinone 0.25 mcg/kg/min (01/06/15 1826)    PRN Medications: hydrALAZINE, lidocaine, lidocaine, lidocaine-EPINEPHrine, morphine injection, ondansetron (ZOFRAN) IV, oxyCODONE, oxymetazoline, sodium chloride, sodium chloride, sodium chloride, traMADol, TRIPLE ANTIBIOTIC, zolpidem   Assessment:   1. Cardiogenic shock s/p HMII implant 12/20/2014 2. Acute/chonic systolic HF   -- NICM EF 91-63% 3. Chronic AF with RVR 4. Expected EBL anemia 5. Acute on chronic renal failure 6. PNA  7. Anemia  8. Epistaxis: right nasal packing.  9. Hemoptysis 10. Ileus: Resolving 11. NSVT  12  Knee and hip pain with history of gout.  Plan/Discussion:   S/P HMII LVAD implant on 9/1   Epistaxis, resolved.   INR 2.06.  Off ASA for now with severe nose bleed.  Hgb today 7.9 no evidence of bleeding.   Volume status elevated but has prominent RV failure. Continue milrinone 0.25 mcg + Revatio.  Weight down 3 pounds. Off diuretics for now (recently orthostatic).  Few PI events yesterday.   MAPs running high. Increase hydralazine 37.5 mg three times a day.   Renal function stable.   AF rate 80s-100s. On po amiodarone for rate control.   Ileus: resolved, can stop Reglan.    Knee pain improved but with collar bone and R chest pain. Sed rate on Friday was 48. Check CRP, CCP,  and rheumatoid factor. At one point thought to have rheumatoid arthritis but PCP evaluated did not think this was the case. He has never seen rheumatologist.  May have referred pocket pain. Add lyrica 75 mg twice a day.  CXR now   Hold discharge.   CLEGG,AMY NP-C  01/07/2015 8:28 AM  Patient seen with NP, agree with the above note.  He walked yesterday and generally has been stable, however continues to have trouble with diffuse pain.   Hip and knee pain resolved but now with right chest (?rib) pain and shoulder girdle pain.  ?Diagnosis of RA, will send CCP and Rf.  ?PMR but ESR not markedly high.  Will get CXR and hold discharge  today.   Loralie Champagne 01/07/2015 9:17 AM

## 2015-01-07 NOTE — Progress Notes (Signed)
Patient ID: [name], male   DOB: 1970/06/15, 44 y.o.   MRN: [id number] HeartMate 2 Rounding Note  Subjective:    Remains on Milrinone 0.25 for RV dysfunction.  Doesn't feel as well today due to pain across upper chest and shoulders that started yesterday afternoon. Knee and hip pain better. He says pain meds improve his pain. Eating well. Bowels moving normally. No shortness of breath.  Sed Rate 48 CRP 15.5  MAP has been high in the 90's to 100's today and hydralazine increased.   LVAD INTERROGATION:  HeartMate II LVAD:  Flow 4.2 liters/min, speed 9200, power 5, PI 8.2.    Objective:    Vital Signs:   Temp:  [98 F (36.7 C)-98.9 F (37.2 C)] 98 F (36.7 C) (09/19 0900) Pulse Rate:  [85-102] 102 (09/19 1500) Resp:  [17-18] 18 (09/19 1500) BP: (109-122)/(71-93) 110/93 mmHg (09/19 1500) SpO2:  [97 %-98 %] 97 % (09/19 0516) Weight:  [78.155 kg (172 lb 4.8 oz)] 78.155 kg (172 lb 4.8 oz) (09/19 0516) Last BM Date: 01/07/15 Mean arterial Pressure 90-100's  Intake/Output:  No intake or output data in the 24 hours ending 01/07/15 1524   Physical Exam: General:  Well appearing. No resp difficulty HEENT: normal Neck: supple. Carotids 2+ bilat; no bruits. No lymphadenopathy or thryomegaly appreciated. Non-tender  Cor: Normal heart sounds with LVAD hum present. Chest incision healing well, sternum stable. Lungs: decreased left base but otherwise clear Abdomen: soft, nontender, nondistended. No hepatosplenomegaly. No bruits or masses. Good bowel sounds. Extremities: no cyanosis, clubbing, rash, edema Neuro: alert & orientedx3, cranial nerves grossly intact. moves all 4 extremities w/o difficulty. Affect pleasant  Telemetry: AF 90's  Labs: Basic Metabolic Panel:  Recent Labs Lab 01/01/15 0405 01/01/15 1610 01/02/15 0453 01/04/15 0605 01/04/15 1000 01/06/15 0425 01/07/15 0517  NA 138 140 136 136  --  136 138  K 3.7 3.5 4.3 3.4*  --  4.6 4.1  CL 103 106 105 101  --   105 107  CO2 28  --  26 28  --  24 24  GLUCOSE 107* 111* 95 90  --  87 94  BUN 18 14 14 14   --  10 9  CREATININE 1.22 1.00 1.15 1.21  --  1.03 1.08  CALCIUM 8.2*  --  8.3* 8.3*  --  8.5* 8.9  MG  --   --   --   --  1.6*  --   --     Liver Function Tests: No results for input(s): AST, ALT, ALKPHOS, BILITOT, PROT, ALBUMIN in the last 168 hours. No results for input(s): LIPASE, AMYLASE in the last 168 hours. No results for input(s): AMMONIA in the last 168 hours.  CBC:  Recent Labs Lab 01/03/15 0750 01/04/15 0605 01/05/15 0711 01/06/15 0425 01/07/15 0517  WBC 9.5 11.2* 9.4 9.1 8.2  HGB 8.4* 8.1* 8.5* 8.0* 7.9*  HCT 27.5* 25.5* 27.3* 25.0* 25.9*  MCV 69.6* 69.3* 70.7* 69.3* 69.6*  PLT 539* 404* 379 325 326    INR:  Recent Labs Lab 01/03/15 0533 01/04/15 0605 01/05/15 0711 01/06/15 0425 01/07/15 0517  INR 1.45 1.45 1.55* 2.02* 2.06*   LDH: 234  Other results:  EKG:   Imaging: Dg Chest Port 1 View  01/07/2015   CLINICAL DATA:  Chest pain, with left ventricular assist device  EXAM: PORTABLE CHEST - 1 VIEW  COMPARISON:  01/01/2015  FINDINGS: Severe cardiac enlargement. Defibrillator in unchanged position as is right PICC line.  Left ventricular assist device in unchanged position.  Mild central vascular congestion and mild interstitial prominence with no definite pulmonary edema. There is left lower lobe extensive opacity suggesting pleural effusion and consolidation.  IMPRESSION: Slightly increased left lower lobe opacities suggesting combination of pleural effusion and consolidation.   Electronically Signed   By: Skipper Cliche M.D.   On: 01/07/2015 09:18      Medications:     Scheduled Medications: . allopurinol  300 mg Oral Daily  . amiodarone  200 mg Oral BID  . antiseptic oral rinse  7 mL Mouth Rinse QID  . bisacodyl  10 mg Oral Daily   Or  . bisacodyl  10 mg Rectal Daily  . citalopram  20 mg Oral Daily  . colchicine  0.6 mg Oral Daily  . digoxin  0.125  mg Oral Daily  . feeding supplement (ENSURE ENLIVE)  237 mL Oral TID BM  . hydrALAZINE  37.5 mg Oral 3 times per day  . pantoprazole  40 mg Oral Daily  . pregabalin  75 mg Oral BID  . sildenafil  20 mg Oral TID  . sodium chloride  10-40 mL Intracatheter Q12H  . sodium chloride  3 mL Intravenous Q12H  . warfarin  4 mg Oral ONCE-1800  . Warfarin - Pharmacist Dosing Inpatient   Does not apply q1800     Infusions: . sodium chloride 4 mL/hr at 01/06/15 1356  . sodium chloride 250 mL (01/01/15 1600)  . lactated ringers Stopped (12/20/14 2000)  . lactated ringers Stopped (12/22/14 0600)  . milrinone 0.25 mcg/kg/min (01/07/15 1058)     PRN Medications:  hydrALAZINE, lidocaine, lidocaine, lidocaine-EPINEPHrine, morphine injection, ondansetron (ZOFRAN) IV, oxyCODONE, oxymetazoline, sodium chloride, sodium chloride, sodium chloride, traMADol, TRIPLE ANTIBIOTIC, zolpidem   Assessment:   1. Acute/chonic systolic HF , NICM EF 0000000 with Cardiogenic shock on milrinone and levophed, IABP preoop, s/p HMII implant 12/20/2014 2. Chronic atrial fib 3. Postop atrial flutter with RVR 4. Expected EBL anemia 5. Fever early postop, resolved 6. Acute post op non-oliguric renal failure 7. Epistaxis with supra-therapeutic INR. 8. Bilateral hip pain with hx of gout   Plan/Discussion:    MAPs elevated. Hydralazine increased.  Wt is 172 lbs which is probably a good weight for him at this time. Avoiding further diuresis with some PI events and RV dysfunction.   Therapeutic INR. Pharmacy managing coumadin.   Etiology of chest wall and shoulder pain uncertain but could be due to surgery and pump pocket pain. It is not uncommon to get this type of pain postop as patients start to do more. CRP and Sed rate elevated but of unclear significance.  Continue mobilization and teaching and hopefully can get home soon.    I reviewed the LVAD parameters from today, and compared the results to the patient's  prior recorded data.  No programming changes were made.  The LVAD is functioning within specified parameters.  The patient performs LVAD self-test daily.  LVAD interrogation was negative for any significant power changes, alarms or PI events/speed drops.  LVAD equipment check completed and is in good working order.  Back-up equipment present.   LVAD education done on emergency procedures and precautions and reviewed exit site care.  Length of Stay: [address]  Gaye Pollack 01/07/2015, 3:24 PM

## 2015-01-07 NOTE — Progress Notes (Signed)
Utilization review completed.  

## 2015-01-07 NOTE — Progress Notes (Signed)
01/07/2015 3:10 PM Nursing note Noted pt. Doppler MAP continues to run high with readings between 100-120. Automatic cuff readings with MAP in 90's. [name] RN with VAD team paged and made aware. Verbal order ok to use MAP with Automatic readings. Orders enacted. Increased scheduled dose po hydralazine administered per prior orders. Will continue to closely monitor patient.  [name], [name]

## 2015-01-07 NOTE — Social Work (Addendum)
CSW met with patient and sister at bedside. Patient shared recent pains and plan for resolution. Patient states he is doing well with ambulation and sister confirmed at bedside. CSW discussed LVAD Support Group tonight and will assist patient to group per MD approval. Patient and sister looking forward to group and opportunity to meet other LVAD patients. CSW will continue to follow for support throughout recovery. Raquel Sarna, [place]

## 2015-01-07 NOTE — Progress Notes (Signed)
Occupational Therapy Treatment Patient Details Name: [name] MRN: [id number] DOB: Apr 27, 1970 Today's Date: 01/07/2015    History of present illness Patient is a 44 yo male admitted 12/14/14 with heart failure. Patient s/p LVAD on 12/20/14. 9/8 developed bil nose bleed (anticoagulated) and required transfer back to ICU PMH: NICM, EF 20-25%, CHF, ICD, Afib, HTN, severe MR/TR, RA   OT comments  Pt had walked prior to session with cardiac rehab and requested to rest in order to get ready for his support group. Focus of session discussing management of ADL after D/C. Recommend pt follow up with home health OT to maximize pt's level of functional independence. Acute OT signing off. All further OT to be addressed by [place].  Follow Up Recommendations  Home health OT    Equipment Recommendations  3 in 1 bedside comode    Recommendations for Other Services      Precautions / Restrictions Precautions Precautions: Sternal;Fall;Other (comment) (LVAD) Restrictions Other Position/Activity Restrictions: Sternal precautions       Mobility Bed Mobility               General bed mobility comments: Pt OOB in chair. Able to verbalize technique  Transfers Overall transfer level: Needs assistance Equipment used: Rolling walker (2 wheeled)             General transfer comment: S    Balance             Standing balance-Leahy Scale: Fair                     ADL                                       Functional mobility during ADLs: Supervision/safety General ADL Comments: Discussed potential D/C home and home set up. Discussed recommendations to remain on main power source when bathing and set up of items counter top height.Also discussed possible yse of AE. Pt to have his sisters help out over the next 2 weeks. Discussed using this period to find out what he needs to work on in order to become more independent. . Discussed using fishing vest for  batteries. Also educatedon importance of alwasy have control box secured to prevent pulling on drive line. Pt verbalized understanding.   Pt able to switch power sources independently and able to verbalize what he needs in black bag.      Vision                     Perception     Praxis      Cognition   Behavior During Therapy: Flat affect Overall Cognitive Status: Within Functional Limits for tasks assessed                       Extremity/Trunk Assessment               Exercises     Shoulder Instructions       General Comments      Pertinent Vitals/ Pain       Pain Assessment: 0-10 Pain Score: 3  Pain Location: R upper chest/shoudlers Pain Descriptors / Indicators: Aching Pain Intervention(s): Limited activity within patient's tolerance;Monitored during session  Home Living  Prior Functioning/Environment              Frequency Min 2X/week     Progress Toward Goals  OT Goals(current goals can now be found in the care plan section)  Progress towards OT goals: Goals met/education completed, patient discharged from OT (further OT to be addressed in home health setting)  Acute Rehab OT Goals Patient Stated Goal: home OT Goal Formulation: With patient Potential to Achieve Goals: Good ADL Goals Pt Will Perform Grooming: with set-up;with supervision;standing Pt Will Perform Upper Body Bathing: with set-up;with supervision;standing;sitting Pt Will Perform Lower Body Bathing: with set-up;with supervision;sit to/from stand Pt Will Perform Upper Body Dressing: with set-up;with supervision;sitting;standing Pt Will Perform Lower Body Dressing: with set-up;with supervision;sit to/from stand Pt Will Transfer to Toilet: with supervision;ambulating;bedside commode Pt Will Perform Toileting - Clothing Manipulation and hygiene: with supervision;sit to/from stand Additional ADL Goal #1: Pt  will be able to change from battery source<>main power source independently Additional ADL Goal #2: Pt will be able to state what 3 things he should have all times in the bag with him Additional ADL Goal #3: Pt will be Mod I and OOB for BADLs  Plan Discharge plan remains appropriate;Frequency needs to be updated    Co-evaluation                 End of Session Equipment Utilized During Treatment: Other (comment) (LVAD equipment)   Activity Tolerance Patient tolerated treatment well   Patient Left in bed;with call bell/phone within reach   Nurse Communication Mobility status        Time: 3582-5189 OT Time Calculation (min): 13 min  Charges: OT General Charges $OT Visit: 1 Procedure OT Treatments $Self Care/Home Management : 8-22 mins  WARD,HILLARY 01/07/2015, 4:22 PM   [facility], OTR/L  [phone] 01/07/2015

## 2015-01-07 NOTE — Progress Notes (Signed)
ANTICOAGULATION CONSULT NOTE - Follow Up Consult  Pharmacy Consult for Coumadin Indication: atrial fibrillation and LVAD  No Known Allergies  Patient Measurements: Height: 5\' 7"  (170.2 cm) Weight: 172 lb 4.8 oz (78.155 kg) IBW/kg (Calculated) : 66.1  Heparin dosing weight: 79.6 kg  Vital Signs: Temp: 98.2 F (36.8 C) (09/19 0516) Temp Source: Oral (09/19 0516) BP: 122/86 mmHg (09/19 0516) Pulse Rate: 102 (09/19 0819)  Labs:  Recent Labs  01/04/15 1230  01/05/15 0711 01/06/15 0425 01/07/15 0517  HGB  --   < > 8.5* 8.0* 7.9*  HCT  --   --  27.3* 25.0* 25.9*  PLT  --   --  379 325 326  LABPROT  --   --  18.7* 22.7* 23.1*  INR  --   --  1.55* 2.02* 2.06*  HEPARINUNFRC 0.45  --  0.28* 0.34  --   CREATININE  --   --   --  1.03 1.08  < > = values in this interval not displayed.  Estimated Creatinine Clearance: 79.9 mL/min (by C-G formula based on Cr of 1.08).  Assessment: 44 y/o male on IV heparin to bridge warfarin post LVAD and for Afib.  Heparin turned off yesterday 9/18.   INR 1.45>1.55>2.02>2.06. Hx sensitive to Coumadin doses last week (INR to 4.45) and epistaxis that required FFP and packing. No further bleeding reported. Hgb and platelets stable.  On Amiodarone.  INR appears to have plateued, will give a little extra warfarin tonight. No bleeding noted. Home soon but not today  Patient was on Eliquis pta but had taken coumadin in the distant past.   Goal of Therapy:  INR 2-3 Monitor platelets by anticoagulation protocol: Yes   Plan:   Heparin drip off  Coumadin to 4 mg x 1 today.   PT/INR and CBC in am.  Monitor for s/sx of bleeding  Erin Hearing PharmD., BCPS Clinical Pharmacist Pager [phone] 01/07/2015 10:37 AM

## 2015-01-07 NOTE — Progress Notes (Addendum)
01/07/2015 0715 Nursing note During report, noted pt. MAP 100 with manual doppler and with automatic cuff. Nightshift RN administered prn Hydralazine.  0815 During reassessment, pt. States he had just gotten back in bed with assistance with acute onset of left upper chest pain and mild SOB. O2 saturations 97-98% on Room air. Mild increase in WOB. Diminished breath sounds noted in comparison to right upper lobe. Pt. Rating pain 10/10. MAP noted via manual doppler still at 100. PRN pain medication administered. [name] RN with VAD team paged and made aware. Verbal order received ok to order STAT 1 View CXR under Dr. Kendrick Ranch. Orders enacted. Pt. Updated on plan of care. Will continue to closely monitor patient.  Cate Oravec, [name]

## 2015-01-07 NOTE — Progress Notes (Signed)
01/07/2015 0845 MD at bedside, pt. Stating some improvement of symptoms at this time. Will continue to monitor patient and await further orders.  [place], [name]

## 2015-01-07 NOTE — Progress Notes (Signed)
CARDIAC REHAB PHASE I   PRE:  Rate/Rhythm: 96 afib  BP:  Supine:   Sitting: dopplered 120 MAP, automatic 96  Standing:    SaO2: 98%RA  MODE:  Ambulation: 350 ft   POST:  Rate/Rhythm: 121 afib  BP:  Supine:   Sitting: 93 MAP automatic  Standing:    SaO2: 96%RA 1435-1530 RN checked with LVAD RN before I walked to notify of MAPs. Pt still c/o left shoulder pain. Pt walked 350 ft on RA with rollator and asst x 1. Gait steady.  Stopped once to rest standing. To sitting on side of bed after walk. Pt switched to batteries and power source independently.   Graylon Good, RN BSN  01/07/2015 3:25 PM

## 2015-01-07 NOTE — Progress Notes (Signed)
PT Cancellation Note  Patient Details Name: JOAQUIN LINARES MRN: [id number] DOB: 12/11/1970   Cancelled Treatment:    Reason Eval/Treat Not Completed: Patient at procedure or test/unavailable. Currently working with Cardiac Rehab. Noted earlier symptoms.   SASSER,LYNN 01/07/2015, 3:02 PM  Pager [phone]

## 2015-01-08 LAB — LACTATE DEHYDROGENASE
LDH: 447 U/L — AB (ref 98–192)
LDH: 267 U/L — ABNORMAL HIGH (ref 98–192)

## 2015-01-08 LAB — BASIC METABOLIC PANEL
Anion gap: 8 (ref 5–15)
BUN: 13 mg/dL (ref 6–20)
CHLORIDE: 105 mmol/L (ref 101–111)
CO2: 24 mmol/L (ref 22–32)
CREATININE: 1.12 mg/dL (ref 0.61–1.24)
Calcium: 9 mg/dL (ref 8.9–10.3)
GFR calc non Af Amer: >60 mL/min (ref >60)
Glucose, Bld: 89 mg/dL (ref 65–99)
POTASSIUM: 5.2 mmol/L — AB (ref 3.5–5.1)
SODIUM: 137 mmol/L (ref 135–145)

## 2015-01-08 LAB — MAGNESIUM: Magnesium: 1.9 mg/dL (ref 1.7–2.4)

## 2015-01-08 LAB — CBC
HEMATOCRIT: 26.2 % — AB (ref 39.0–52.0)
Hemoglobin: 8 g/dL — ABNORMAL LOW (ref 13.0–17.0)
MCH: 21 pg — AB (ref 26.0–34.0)
MCHC: 30.5 g/dL (ref 30.0–36.0)
MCV: 68.8 fL — AB (ref 78.0–100.0)
PLATELETS: 324 10*3/uL (ref 150–400)
RBC: 3.81 MIL/uL — AB (ref 4.22–5.81)
RDW: 23.8 % — ABNORMAL HIGH (ref 11.5–15.5)
WBC: 9.7 10*3/uL (ref 4.0–10.5)

## 2015-01-08 LAB — CARBOXYHEMOGLOBIN
CARBOXYHEMOGLOBIN: 1.9 % — AB (ref 0.5–1.5)
METHEMOGLOBIN: 0.9 % (ref 0.0–1.5)
O2 SAT: 59.2 %
Total hemoglobin: 8.7 g/dL — ABNORMAL LOW (ref 13.5–18.0)

## 2015-01-08 LAB — PROTIME-INR
INR: 2.91 — ABNORMAL HIGH (ref 0.00–1.49)
PROTHROMBIN TIME: 29.9 s — AB (ref 11.6–15.2)

## 2015-01-08 LAB — CYCLIC CITRUL PEPTIDE ANTIBODY, IGG/IGA: CCP Antibodies IgG/IgA: 14 units (ref 0–19)

## 2015-01-08 LAB — RHEUMATOID FACTOR: RHEUMATOID FACTOR: 10.9 [IU]/mL (ref 0.0–13.9)

## 2015-01-08 LAB — DIGOXIN LEVEL: DIGOXIN LVL: 0.8 ng/mL (ref 0.8–2.0)

## 2015-01-08 MED ORDER — OXYMETAZOLINE HCL 0.05 % NA SOLN: 1.0000 | Freq: Two times a day (BID) | NASAL | Status: DC | PRN

## 2015-01-08 MED ORDER — DIGOXIN 125 MCG PO TABS: 0.1250 mg | ORAL_TABLET | Freq: Every day | ORAL | Status: DC

## 2015-01-08 MED ORDER — HYDRALAZINE HCL 50 MG PO TABS
50.0000 mg | ORAL_TABLET | Freq: Three times a day (TID) | ORAL | Status: DC
  Administered 2015-01-08 (×2): 50 mg via ORAL
  Filled 2015-01-08 (×2): qty 1

## 2015-01-08 MED ORDER — AMIODARONE HCL 200 MG PO TABS: 200.0000 mg | ORAL_TABLET | Freq: Two times a day (BID) | ORAL | Status: DC

## 2015-01-08 MED ORDER — SILDENAFIL CITRATE 20 MG PO TABS: 20.0000 mg | ORAL_TABLET | Freq: Three times a day (TID) | ORAL | Status: DC

## 2015-01-08 MED ORDER — WARFARIN SODIUM 2 MG PO TABS: ORAL_TABLET | ORAL | Status: DC

## 2015-01-08 MED ORDER — PREGABALIN 75 MG PO CAPS: 75.0000 mg | ORAL_CAPSULE | Freq: Two times a day (BID) | ORAL | Status: DC

## 2015-01-08 MED ORDER — WARFARIN SODIUM 1 MG PO TABS
0.5000 mg | ORAL_TABLET | Freq: Once | ORAL | Status: AC
Start: 2015-01-08 — End: 2015-01-08
  Administered 2015-01-08: 0.5 mg via ORAL
  Filled 2015-01-08 (×2): qty 0.5

## 2015-01-08 MED ORDER — HYDRALAZINE HCL 50 MG PO TABS: 50.0000 mg | ORAL_TABLET | Freq: Three times a day (TID) | ORAL | Status: DC

## 2015-01-08 MED ORDER — CITALOPRAM HYDROBROMIDE 20 MG PO TABS: 20.0000 mg | ORAL_TABLET | Freq: Every day | ORAL | Status: DC

## 2015-01-08 MED ORDER — PANTOPRAZOLE SODIUM 40 MG PO TBEC: 40.0000 mg | DELAYED_RELEASE_TABLET | Freq: Every day | ORAL | Status: DC

## 2015-01-08 MED ORDER — FUROSEMIDE 40 MG PO TABS: 40.0000 mg | ORAL_TABLET | ORAL | Status: DC | PRN

## 2015-01-08 MED ORDER — ZOLPIDEM TARTRATE 5 MG PO TABS: 5.0000 mg | ORAL_TABLET | Freq: Every evening | ORAL | Status: DC | PRN

## 2015-01-08 MED ORDER — OXYCODONE HCL 5 MG PO TABS: 5.0000 mg | ORAL_TABLET | Freq: Four times a day (QID) | ORAL | Status: DC | PRN

## 2015-01-08 MED ORDER — MILRINONE IN DEXTROSE 20 MG/100ML IV SOLN: 0.2500 ug/kg/min | INTRAVENOUS | Status: DC

## 2015-01-08 MED ORDER — ALLOPURINOL 300 MG PO TABS
300.0000 mg | ORAL_TABLET | Freq: Every day | ORAL | Status: DC
Start: 2015-01-08 — End: 2015-09-25

## 2015-01-08 NOTE — Social Work (Signed)
CSW met with patient and sister at bedside. Patient states he is grateful to have attended support group last night as it helped him mentally prepare for discharge home. Patient and sister were able to speak with members of the LVAD group who shared their experiences of life with a VAD. Patient was smiling and stated he slept well last night for the first time since VAD placement. Patient is hopeful to go home today pending MD approval. Patient and sister feel ready and confident for discharge. CSW will follow patient outpatient through [facility] for any needs that arise. Raquel Sarna, [place]

## 2015-01-08 NOTE — Progress Notes (Signed)
ANTICOAGULATION CONSULT NOTE - Follow Up Consult  Pharmacy Consult for Coumadin Indication: atrial fibrillation and LVAD  No Known Allergies  Patient Measurements: Height: 5\' 7"  (170.2 cm) Weight: 171 lb 3.2 oz (77.656 kg) IBW/kg (Calculated) : 66.1  Heparin dosing weight: 79.6 kg  Labs:  Recent Labs  01/06/15 0425 01/07/15 0517 01/08/15 0438  HGB 8.0* 7.9* 8.0*  HCT 25.0* 25.9* 26.2*  PLT 325 326 324  LABPROT 22.7* 23.1* 29.9*  INR 2.02* 2.06* 2.91*  HEPARINUNFRC 0.34  --   --   CREATININE 1.03 1.08 1.12    Estimated Creatinine Clearance: 77.1 mL/min (by C-G formula based on Cr of 1.12).  Assessment: 44 y/o male on warfarin post LVAD and for Afib.  Heparin turned off 9/18.   INR 1.45>1.55>2.02>2.06. Hx sensitive to Coumadin doses last week (INR to 4.45) and epistaxis that required FFP and packing. No further bleeding reported. Hgb and platelets stable.  On Amiodarone.  INR up to 2.9 this am after increasing to 4mg  last night. He has very labile INRs seen when small increases in warfarin doses. No bleeding noted.  Will give very low dose of 0.5mg  tonight before going home if discharged, then send home on 2mg  daily will INR follow up later this week.  Patient was on Eliquis pta but had taken coumadin in the distant past, he had no questions.  Goal of Therapy:  INR 2-3 Monitor platelets by anticoagulation protocol: Yes   Plan:  Coumadin 0.5mg  tonight x 1 today prior to d/c if going home then d/c on 2mg  daily   Monitor for s/sx of bleeding  Erin Hearing PharmD., BCPS Clinical Pharmacist Pager [phone] 01/08/2015 9:15 AM

## 2015-01-08 NOTE — Progress Notes (Signed)
Patient discharged home with his sister. Patient taken to car in wheelchair. LVAD on battery power and milrinone iv running on pump provided by [facility]. Patient and sister given discharge instructions and prescriptions. They stated that they understood. Patient and his sister have completed teaching on LVAD dressing change with LVAD coordinator. Patient to follow up appointment on discharge paperwork. All LVAD equipment for home use placed in patient's car along with rolling walker from [facility].

## 2015-01-08 NOTE — Progress Notes (Signed)
[phone] Education with pt and sister regarding walking for exercise, sternal precautions, IS , daily weights and sodium and fluid restriction. Did not refer to CRP 2 as pt going home on milrinone. Left brochure in case pt able to get off of milrinone in future. Understanding voiced.  Graylon Good RN BSN 01/08/2015 1:30 PM

## 2015-01-08 NOTE — Progress Notes (Signed)
LVAD Coordinator Discharge Note:   Patient has had all home equipment delivered and recorded on our secure spreadsheets. All education has been completed in previous teaching with Zada Girt, RN VAD Coordinator (see her note on 12/30/14).   Performed a controller change out with [name] and reviewed emergency protocol prior to discharge. All questions answered. Pam from [facility] to connect Milrinone prior to discharge--will plan on weaning slowly outpatient as we have in the past.   F/U VAD Appointment scheduled for Monday 01/14/15 @ 10:00. Instructed to page the [place] pager if they encounter any questions over the weekend.   [name], RN VAD Coordinator   Office: [phone] 24/7 VAD Pager: [phone]

## 2015-01-08 NOTE — Progress Notes (Signed)
Patient ID: [name], male   DOB: March 18, 1971, 44 y.o.   MRN: [id number]           VAD Team Rounding Note  Subjective:    s/p HMII implant 9/1  9/6 Milrinone cut back to 0.125 mcg.  VAD was increased to 9200.  9/7 Continued on milrinone 0.125 mcg. INR 4.4 Transferred to 2W 9/8 Developed epitaxis. Coughing up clots. INR 4.23 Given FFP.  CXR ok ENT consulted.  9/11 INR 4.28 Received 2 FFP 9/12 INR today 2.56 Hgb 7.3 received 1UPRBCs. Amio transitioned to po. CO-OX 47%. Milrinone increased to 0.25 mcg.  9/13 INR 1.6 CO-OX 56% on milrinone 0.25 mcg. Transferred to 2W 9/13 Ramp echo speed maintained at 9200 9/15 NSVT   Remains on milrinone 0.25 mcg as well as Revatio for RV failure. Joint pain much improved. Yesterday he was started lyrica and hydralazine was increased. Ambulating in the hall.   Denies SOB/CP    INR 2.06>2.9 LDH 234>447  LVAD INTERROGATION:  HeartMate II LVAD:  Flow 4.2 liters/min, speed 9180, power 5,  PI  8.2      Objective:    Vital Signs:   Temp:  [98.4 F (36.9 C)-99.5 F (37.5 C)] 99.5 F (37.5 C) (09/20 0430) Pulse Rate:  [95-105] 95 (09/20 0430) Resp:  [18] 18 (09/20 0430) BP: (102-121)/(75-93) 111/87 mmHg (09/20 0847) SpO2:  [97 %-98 %] 97 % (09/20 0430) Weight:  [171 lb 3.2 oz (77.656 kg)] 171 lb 3.2 oz (77.656 kg) (09/20 0430) Last BM Date: 01/07/15 Mean arterial Pressure 80-90s  Intake/Output:   Intake/Output Summary (Last 24 hours) at 01/08/15 0910 Last data filed at 01/08/15 0430  Gross per 24 hour  Intake     10 ml  Output      0 ml  Net     10 ml     Physical Exam:  General: In bed.  NAD  HEENT: normal .  Neck: supple. JVP 9-10 cm.  Cor: Sternal incision approximated.   Mechanical heart sounds with LVAD hum present. R upper chest pain -tender to touch.  Lungs: Clear  Decreased breath sounds on the left. LLL with crackles.  Abdomen: soft, nontender, nondistended. + bowel sounds. Driveline: C/D/I; securement device intact and  driveline incorporated Extremities: no cyanosis, clubbing, rash.  Trace-1+ ankle edema.  Neuro: awake, alert, normal affect   Telemetry: AF 80-90s   Labs: Basic Metabolic Panel:  Recent Labs Lab 01/02/15 0453 01/04/15 0605 01/04/15 1000 01/06/15 0425 01/07/15 0517 01/08/15 0438  NA 136 136  --  136 138 137  K 4.3 3.4*  --  4.6 4.1 5.2*  CL 105 101  --  105 107 105  CO2 26 28  --  24 24 24   GLUCOSE 95 90  --  87 94 89  BUN 14 14  --  10 9 13   CREATININE 1.15 1.21  --  1.03 1.08 1.12  CALCIUM 8.3* 8.3*  --  8.5* 8.9 9.0  MG  --   --  1.6*  --   --  1.9    Liver Function Tests: No results for input(s): AST, ALT, ALKPHOS, BILITOT, PROT, ALBUMIN in the last 168 hours. No results for input(s): LIPASE, AMYLASE in the last 168 hours. No results for input(s): AMMONIA in the last 168 hours.  CBC:  Recent Labs Lab 01/04/15 0605 01/05/15 0711 01/06/15 0425 01/07/15 0517 01/08/15 0438  WBC 11.2* 9.4 9.1 8.2 9.7  HGB 8.1* 8.5* 8.0* 7.9* 8.0*  HCT 25.5* 27.3* 25.0* 25.9* 26.2*  MCV 69.3* 70.7* 69.3* 69.6* 68.8*  PLT 404* 379 325 326 324    INR:  Recent Labs Lab 01/04/15 0605 01/05/15 0711 01/06/15 0425 01/07/15 0517 01/08/15 0438  INR 1.45 1.55* 2.02* 2.06* 2.91*    Other results:   Imaging: Dg Chest Port 1 View  01/07/2015   CLINICAL DATA:  Chest pain, with left ventricular assist device  EXAM: PORTABLE CHEST - 1 VIEW  COMPARISON:  01/01/2015  FINDINGS: Severe cardiac enlargement. Defibrillator in unchanged position as is right PICC line. Left ventricular assist device in unchanged position.  Mild central vascular congestion and mild interstitial prominence with no definite pulmonary edema. There is left lower lobe extensive opacity suggesting pleural effusion and consolidation.  IMPRESSION: Slightly increased left lower lobe opacities suggesting combination of pleural effusion and consolidation.   Electronically Signed   By: Skipper Cliche M.D.   On: 01/07/2015  09:18     Medications:     Scheduled Medications: . allopurinol  300 mg Oral Daily  . amiodarone  200 mg Oral BID  . antiseptic oral rinse  7 mL Mouth Rinse QID  . bisacodyl  10 mg Oral Daily   Or  . bisacodyl  10 mg Rectal Daily  . citalopram  20 mg Oral Daily  . colchicine  0.6 mg Oral Daily  . digoxin  0.125 mg Oral Daily  . feeding supplement (ENSURE ENLIVE)  237 mL Oral TID BM  . hydrALAZINE  37.5 mg Oral 3 times per day  . pantoprazole  40 mg Oral Daily  . pregabalin  75 mg Oral BID  . sildenafil  20 mg Oral TID  . sodium chloride  10-40 mL Intracatheter Q12H  . sodium chloride  3 mL Intravenous Q12H  . Warfarin - Pharmacist Dosing Inpatient   Does not apply q1800    Infusions: . sodium chloride 4 mL/hr at 01/06/15 1356  . sodium chloride 250 mL (01/01/15 1600)  . lactated ringers Stopped (12/20/14 2000)  . lactated ringers Stopped (12/22/14 0600)  . milrinone 0.25 mcg/kg/min (01/08/15 0430)    PRN Medications: hydrALAZINE, lidocaine, lidocaine, lidocaine-EPINEPHrine, morphine injection, ondansetron (ZOFRAN) IV, oxyCODONE, oxymetazoline, sodium chloride, sodium chloride, sodium chloride, traMADol, TRIPLE ANTIBIOTIC, zolpidem   Assessment:   1. Cardiogenic shock s/p HMII implant 12/20/2014 2. Acute/chonic systolic HF   -- NICM EF 0000000 3. Chronic AF with RVR 4. Expected EBL anemia 5. Acute on chronic renal failure 6. PNA  7. Anemia  8. Epistaxis: right nasal packing.  9. Hemoptysis 10. Ileus: Resolving 11. NSVT  12  Knee and hip pain with history of gout.  Plan/Discussion:   S/P HMII LVAD implant on 9/1   Epistaxis, resolved.   INR 2.9.  Off ASA for now with severe nose bleed.  Hgb stable today at 8.0, no evidence of bleeding.   LDH today up to 447 from 234?  Will repeat now. No overt HF symptoms today. Volume status elevated but has prominent RV failure. Continue milrinone 0.25 mcg + Revatio.  Weight down 1 pounds. Off diuretics for now (recently  orthostatic).  Few PI events yesterday.   MAP still on the high side, hydralazine to 50 mg tid.    Renal function stable.   AF rate 80s-100s but increases to 110s with exertion. Continue amio 200 mg twice a day.    Ileus: resolved, off Reglan.    Knee pain improved but with collar bone and R chest pain. Sed rate  on Friday was 48. CRP 15.5 Rheumatoid factor 10.9 (normal). May have referred pocket pain, much better today on Lyrica.    I reviewed the LVAD parameters from today, and compared the results to the patient's prior recorded data. No programming changes were made. The LVAD is functioning within specified parameters. The patient performs LVAD self-test daily. LVAD interrogation was negative for any significant power changes, alarms or PI events/speed drops. LVAD equipment check completed and is in good working order. Back-up equipment present. LVAD education done on emergency procedures and precautions and reviewed exit site care.  Possible D/C today .  VAD Team Pager [phone] (7am - 7am)  CLEGG,AMY NP-C  01/08/2015 9:10 AM   Patient seen with NP, agree with the above note.  Possible neuropathic pain, now better on Lyrica.    Renal function stable, weight stable.  He remains on milrinone 0.25.  Will leave off diuretics for now as he has been maintaining his weight over a number of days now.   LVAD parameters stable. INR therapeutic, hgb stable.    His LDH is significantly higher today (234=>447).  No good explanation for this, no new HF symptoms. Send co-ox today and resend LDH.  Hopefully home this afternoon depending on results.   Loralie Champagne 01/08/2015 10:42 AM

## 2015-01-08 NOTE — Progress Notes (Signed)
Patient EPW removed. Patient on bedrest for 1 hour with frequent vitals every 15 minutes. Per MD patient to D/C in two hours. Will continue to monitor.   Domingo Dimes RN

## 2015-01-08 NOTE — Care Management Note (Signed)
Case Management Note CM note started by [place]  Patient Details  Name: [name] MRN: [id number] Date of Birth: [birth year]  Subjective/Objective:    Pt lives alone, two sisters live in [place] but are arranging to stay with pt for at least two weeks after he is discharged, longer if needed.  Provided information re home health agencies in [facility], referral made to [facility] for their LVAD program as requested.                  Action/Plan: CM will continue to follow          Expected Discharge Plan:  [place]  Discharge planning Services  CM Consult  HH Arranged:  RN, Disease Management [place] Agency:  [facility]    Expected Discharge Date:       01/08/15           Expected Discharge Plan:  [place]  In-House Referral:     Discharge planning Services  CM Consult  Post Acute Care Choice:  Home Health Choice offered to:  Patient  DME Arranged:  Walker rolling with seat DME Agency:  [place] Arranged:  RN, Disease Management [place] Agency:  [place]  Status of Service:  Completed, signed off  Medicare Important Message Given:    Date Medicare IM Given:    Medicare IM give by:    Date Additional Medicare IM Given:    Additional Medicare Important Message give by:     If discussed at [place] of Stay Meetings, dates discussed:  01/08/15  Additional Comments:  01/08/15- Marvetta Gibbons RN, BSN - pt for d/c home today- have spoken with Pam from [facility] regarding home IV milrinone- plan to be here at 4pm for hook up- also needs rollator- have called Jermaine with [facility] regarding DME needs- to have rollator delivered to room prior to discharge. HH for HF management has been arranged through [facility].   Dawayne Patricia, RN 01/08/2015, 3:17 PM

## 2015-01-08 NOTE — Progress Notes (Signed)
Physical Therapy Discharge Patient Details Name: [name] MRN: [id number] DOB: 08-Jan-1971 Today's Date: 01/08/2015 Time:  -     Patient discharged from PT services secondary to pt discharging home today per MD.  Please see latest therapy progress note for current level of functioning and progress toward goals.    Progress and discharge plan discussed with patient and/or caregiver: Patient/Caregiver agrees with plan  GP     SASSER,LYNN 01/08/2015, 3:58 PM

## 2015-01-09 DIAGNOSIS — Z48812 Encounter for surgical aftercare following surgery on the circulatory system: Secondary | ICD-10-CM | POA: Diagnosis not present

## 2015-01-11 ENCOUNTER — Ambulatory Visit (HOSPITAL_COMMUNITY): Payer: Self-pay | Admitting: Infectious Diseases

## 2015-01-11 DIAGNOSIS — I482 Chronic atrial fibrillation, unspecified: Secondary | ICD-10-CM

## 2015-01-11 DIAGNOSIS — Z95811 Presence of heart assist device: Secondary | ICD-10-CM

## 2015-01-11 LAB — POCT INR: INR: 3.1

## 2015-01-14 ENCOUNTER — Ambulatory Visit (HOSPITAL_COMMUNITY)
Admit: 2015-01-14 | Discharge: 2015-01-14 | Disposition: A | Discharge status: Home / Self Care | Payer: 59 | Source: Ambulatory Visit | Attending: Cardiology | Admitting: Cardiology

## 2015-01-14 ENCOUNTER — Encounter (HOSPITAL_COMMUNITY): Payer: Self-pay

## 2015-01-14 ENCOUNTER — Ambulatory Visit: Payer: Self-pay | Admitting: Infectious Diseases

## 2015-01-14 ENCOUNTER — Other Ambulatory Visit (HOSPITAL_COMMUNITY): Payer: Self-pay | Admitting: Infectious Diseases

## 2015-01-14 VITALS — BP 98/0 | HR 65 | Ht 67.0 in | Wt 170.0 lb

## 2015-01-14 DIAGNOSIS — R0609 Other forms of dyspnea: Secondary | ICD-10-CM

## 2015-01-14 DIAGNOSIS — I482 Chronic atrial fibrillation, unspecified: Secondary | ICD-10-CM

## 2015-01-14 DIAGNOSIS — D62 Acute posthemorrhagic anemia: Secondary | ICD-10-CM

## 2015-01-14 DIAGNOSIS — T560X1D Toxic effect of lead and its compounds, accidental (unintentional), subsequent encounter: Secondary | ICD-10-CM | POA: Diagnosis not present

## 2015-01-14 DIAGNOSIS — Z7901 Long term (current) use of anticoagulants: Secondary | ICD-10-CM

## 2015-01-14 DIAGNOSIS — I10 Essential (primary) hypertension: Secondary | ICD-10-CM | POA: Diagnosis not present

## 2015-01-14 DIAGNOSIS — I509 Heart failure, unspecified: Secondary | ICD-10-CM | POA: Diagnosis not present

## 2015-01-14 DIAGNOSIS — M1A1390 Lead-induced chronic gout, unspecified wrist, without tophus (tophi): Secondary | ICD-10-CM

## 2015-01-14 DIAGNOSIS — M109 Gout, unspecified: Secondary | ICD-10-CM | POA: Insufficient documentation

## 2015-01-14 DIAGNOSIS — I5022 Chronic systolic (congestive) heart failure: Secondary | ICD-10-CM | POA: Insufficient documentation

## 2015-01-14 DIAGNOSIS — I081 Rheumatic disorders of both mitral and tricuspid valves: Secondary | ICD-10-CM | POA: Diagnosis not present

## 2015-01-14 DIAGNOSIS — Z79899 Other long term (current) drug therapy: Secondary | ICD-10-CM | POA: Diagnosis not present

## 2015-01-14 DIAGNOSIS — Z95811 Presence of heart assist device: Secondary | ICD-10-CM

## 2015-01-14 DIAGNOSIS — M069 Rheumatoid arthritis, unspecified: Secondary | ICD-10-CM | POA: Diagnosis not present

## 2015-01-14 DIAGNOSIS — F4321 Adjustment disorder with depressed mood: Secondary | ICD-10-CM | POA: Insufficient documentation

## 2015-01-14 DIAGNOSIS — I5023 Acute on chronic systolic (congestive) heart failure: Secondary | ICD-10-CM

## 2015-01-14 DIAGNOSIS — I428 Other cardiomyopathies: Secondary | ICD-10-CM | POA: Insufficient documentation

## 2015-01-14 DIAGNOSIS — M1000 Idiopathic gout, unspecified site: Secondary | ICD-10-CM

## 2015-01-14 DIAGNOSIS — I50810 Right heart failure, unspecified: Secondary | ICD-10-CM

## 2015-01-14 LAB — COMPREHENSIVE METABOLIC PANEL
ALT: 34 U/L (ref 17–63)
ANION GAP: 7 (ref 5–15)
AST: 45 U/L — ABNORMAL HIGH (ref 15–41)
Albumin: 2.6 g/dL — ABNORMAL LOW (ref 3.5–5.0)
Alkaline Phosphatase: 169 U/L — ABNORMAL HIGH (ref 38–126)
BILIRUBIN TOTAL: 0.7 mg/dL (ref 0.3–1.2)
BUN: 10 mg/dL (ref 6–20)
CHLORIDE: 109 mmol/L (ref 101–111)
CO2: 24 mmol/L (ref 22–32)
Calcium: 9.1 mg/dL (ref 8.9–10.3)
Creatinine, Ser: 1.04 mg/dL (ref 0.61–1.24)
GFR calc Af Amer: >60 mL/min (ref >60)
Glucose, Bld: 96 mg/dL (ref 65–99)
POTASSIUM: 3.9 mmol/L (ref 3.5–5.1)
Sodium: 140 mmol/L (ref 135–145)
TOTAL PROTEIN: 7.2 g/dL (ref 6.5–8.1)

## 2015-01-14 LAB — MAGNESIUM: Magnesium: 1.7 mg/dL (ref 1.7–2.4)

## 2015-01-14 LAB — CARBOXYHEMOGLOBIN
Carboxyhemoglobin: 1.6 % — ABNORMAL HIGH (ref 0.5–1.5)
Methemoglobin: 0.7 % (ref 0.0–1.5)
O2 Saturation: 68 %
TOTAL HEMOGLOBIN: 9.3 g/dL — AB (ref 13.5–18.0)

## 2015-01-14 LAB — CBC
HCT: 29.9 % — ABNORMAL LOW (ref 39.0–52.0)
Hemoglobin: 9.4 g/dL — ABNORMAL LOW (ref 13.0–17.0)
MCH: 20.9 pg — AB (ref 26.0–34.0)
MCHC: 31.4 g/dL (ref 30.0–36.0)
MCV: 66.6 fL — AB (ref 78.0–100.0)
PLATELETS: 353 10*3/uL (ref 150–400)
RBC: 4.49 MIL/uL (ref 4.22–5.81)
RDW: 22 % — AB (ref 11.5–15.5)
WBC: 6.3 10*3/uL (ref 4.0–10.5)

## 2015-01-14 LAB — LACTATE DEHYDROGENASE: LDH: 284 U/L — ABNORMAL HIGH (ref 98–192)

## 2015-01-14 LAB — BRAIN NATRIURETIC PEPTIDE: B NATRIURETIC PEPTIDE 5: 162.9 pg/mL — AB (ref 0.0–100.0)

## 2015-01-14 LAB — PROTIME-INR
INR: 2.66 — AB (ref 0.00–1.49)
PROTHROMBIN TIME: 27.9 s — AB (ref 11.6–15.2)

## 2015-01-14 MED ORDER — SPIRONOLACTONE 25 MG PO TABS: 12.5000 mg | ORAL_TABLET | Freq: Every day | ORAL | Status: DC

## 2015-01-14 MED ORDER — MILRINONE IN DEXTROSE 20 MG/100ML IV SOLN: 0.1250 ug/kg/min | INTRAVENOUS | Status: DC

## 2015-01-14 MED ORDER — FERROUS SULFATE 325 (65 FE) MG PO TABS: 325.0000 mg | ORAL_TABLET | Freq: Two times a day (BID) | ORAL | Status: DC

## 2015-01-14 MED ORDER — COLCHICINE 0.6 MG PO TABS: 0.6000 mg | ORAL_TABLET | Freq: Two times a day (BID) | ORAL | Status: DC | PRN

## 2015-01-14 NOTE — Patient Instructions (Addendum)
1. Get in more protein throughout the day--snacks of cheese, nuts, baked chicken. Your appetite will slowly come back and improve.   2. We are decreasing your Milrinone today!! YAY!!   3. INR is 2.66 today (goal 2 - 3). No changes in your coumadin.   Continue taking ONE 2 mg pill every day   We will recheck next week.   4. Start Spironolactone 12.5 mg every morning to help with fluid and blood pressure.   5. Threshold for your lasix--anytime your weight goes up by 3lb over night or 5lb over a few days, or if you have noticible swelling in your legs/ankles or abdomen (bloated).   6. Start Iron supplements 2 times a day ONE pill AND take stool softener (colace) once or twice a day-->make sure your stool is soft and not hard. This will change the color of your stool to a darker/black color.   7. Return to clinic in 1 week.

## 2015-01-14 NOTE — Progress Notes (Addendum)
Symptom Yes No Details  Angina  x Activity:  Claudication  x How far:  Syncope  x When:  Stroke  x   Orthopnea  x How many pillows: 2 for comfort  PND  x How often:  CPAP  N/A How many hrs:  Pedal edema  x   Abd fullness  x   N&V  x   Diaphoresis  x When:  Bleeding  x No BRBPR or blood in stool  Urine  x Reports clear yellow urine   SOB  x Activity:  Palpitations  x When:  ICD shock  x   Hospitlizaitons x  When/where/why: Discharged from LVAD implant 01/08/15  ED visit  x When/where/why:  Other MD  x When/who/why:  Activity   3 times a day gets up and walks laps inside around house; uses rollator currently  Fluid   <2L, mostly water and juice   Diet   Ensure supplements BID   Vital Signs: HR: 65 Doppler BP: 98 Automatic BP: 127/77 (93) SPO2: 96%  Weight: 170.0 lb Home Weight: 167 - 169 lb  VAD interrogation & Equipment Management: Speed: 9200 Flow:  4.9 Power:  5.4 PI: 7.5  Alarms:  none Events: 1 -8 events daily.    Fixed speed: 9200 Low speed limit: 8600  Primary Controller:  Replace back up battery in  32 months. Back up controller:   Replace back up battery in  32 months.  Annual Equipment Maintenance on UBC/PM was performed on 01/07/15 for initial check in.   I reviewed the LVAD parameters from today and compared the results to the patient's prior recorded data.  LVAD interrogation was NEGATIVE for significant power changes, NEGATIVE for clinical alarms and NEGATIVE for significant PI events/speed drops. PI events occurring at 1 - 8 every day since discharge with lowest PI 2.8--with PI's this low it is after several back to back events--assumingly position related and possibly not enough to drink (Small LVID).No programming changes were made and pump is functioning within specified parameters.   LVAD equipment check completed and is in good working order.  Back-up equipment present. Pt/caregiver deny any alarms or VAD equipment issues.  Exit Site  Care: Drive line is being maintained sisters Caren Griffins and [employer] daily. Caren Griffins changed the dressing for the first time yesterday; we had her perform dressing change in clinic so we can assist with any support she may need. Did a great job maintaining sterile technique. Denies any fevers, chills, drainage or any foul odor. VAD dressing removed and site care performed using sterile technique. Drive line exit site cleaned with Chlora prep applicators x 2, allowed to dry, and gauze dressing with aquacel strip re-applied. Exit site healing, the velour is fully implanted at exit site. Both the proximal and distal sutures were removed today. Moderate crust in place after proximal suture removed--advised to allow it to come off with time and not to "pick at it".  Driveline dressing is being changed daily per sterile technique. Consider advancing to every other day next week.   Chest tube sutures were removed today.   Encounter Details: Patient presents for first weekly post hospital D/C follow up in [facility] today with sister Caren Griffins. Reports to be adapting to equipment well. Sleeping well with Ambien sleep aid. He is wearing a new vest to hold his batteries and controller and everything is neat and arranged well without any twisting of his equipment.  C/O some neck/shoulder pain and what seems to be pocket pain  when he points to it--has not filled Lyrica prescription at this time so has been without this medication since D/C--encouraged to fill and resume taking it as prescribed BID to assist with some of his pain. Also having B/L knee pain--feels it is gout flare; he is taking allopurinol as prescribed however was not discharged home on Colcrys--Rx sent to requested pharmacy as D/W Dr. Haroldine Laws.   His sister shared with me that she has no further vacation time and is unsure if Pam will be able to extend out her initial request to care for [name]--she is concerned that he will not be able to maintain the flushing on  his PICC line. I also inquired as to who will be doing the daily dressing requirements for [name]--Priscilla from my understanding was going to be the point person once Pam and Caren Griffins needed to return to work, however Caren Griffins is unsure of her commitment. I will call her today and try to arrange for her to come to clinic appointment next week to teach about dressing care and assess what her long term involvement in his care.   Co-Ox today 68.0% on 0.54mcg Milrinone-->D/W Dr. Haroldine Laws and will drop dose to 0.125 mcg. Recheck in 1w.   INR 2.66 today. Anticoagulation management includes INR goal of 2.0 - 3.0 with no ASA (epistaxis in hospital). See anticoagulation flow sheet.   LDH stable at 284 with established baseline of 240 - 280. Denies tea-colored urine.   MAPs elevated today at 93 (doppler MAP is 98) on current medication regimen. D/W Dr. Haroldine Laws and will start Spironolactone 12.5 mg QD. Discussed threshold to taking lasix--3+lb weight gain overnight, or 5+lb over a few days-week OR with noticeable feet/ankle swelling/abdominal bloating.   HgB 9.4--MCV only 66.6-->D/W Dr. Haroldine Laws about resuming PO Iron 325 mg BID (was taking in hospital). Will resume BID and add colace stool softener-->prescription sent and precautions dicussed re: what to report regarding changes in stool on Iron (darker than normal, streaks of blood, green-ish appearance).   Medication Changes at this encounter: Resume Lyrica 75 mg BID; Spironolactone 12.5 mg QD, Iron 325 mg PO BID with Colace 100 - 200 mg QD.   VAD coordinator reviewed daily log from home for daily temperature, weight, and VAD parameters. Pt is performing daily controller and system monitor self tests along with completing weekly and monthly maintenance for LVAD equipment.    RTC in 1 week for F/U visit .  [name], RN VAD Coordinator   Office: [phone] 24/7 VAD Pager: [phone]

## 2015-01-14 NOTE — Anesthesia Postprocedure Evaluation (Signed)
  Anesthesia Post-op Note  Patient: [name]  Procedure(s) Performed: Procedure(s) with comments: INSERTION OF IMPLANTABLE LEFT VENTRICULAR ASSIST DEVICE (N/A) - CIRC ARREST  NITRIC OXIDE TRANSESOPHAGEAL ECHOCARDIOGRAM (TEE) (N/A)  Patient Location: SICU  Anesthesia Type:General  Level of Consciousness: sedated and Patient remains intubated per anesthesia plan  Airway and Oxygen Therapy: Patient remains intubated per anesthesia plan and Patient placed on Ventilator (see vital sign flow sheet for setting)  Post-op Pain: none  Post-op Assessment: Post-op Vital signs reviewed, Patient's Cardiovascular Status Stable, Respiratory Function Stable, Patent Airway and Pain level controlled LLE Motor Response: Purposeful movement   RLE Motor Response: Purposeful movement        Post-op Vital Signs: stable  Last Vitals:  Filed Vitals:   01/08/15 1452  BP: 114/80  Pulse:   Temp:   Resp:     Complications: No apparent anesthesia complications

## 2015-01-15 DIAGNOSIS — M109 Gout, unspecified: Secondary | ICD-10-CM | POA: Insufficient documentation

## 2015-01-15 NOTE — Progress Notes (Signed)
LVAD CLINIC NOTE   HPI:  [name] is a 44 y/o male with systolic HF due to NICM (EF 20-25%) s/p [employer] ICD, chronic AF and gout who underwent HM II VAD implantation on 12/20/14. 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  Follow up for Heart Failure/LVAD:  Returns for first post-hospital visit. Doing very well. Energy returning. Denies SOB or edema. No problems with pump or driveline. Taking all meds as prescribed. Continues on milrinone 0.25. Having knee pain which he attributes to gout.    Denies LVAD alarms.  Denies driveline trauma, erythema or drainage.  Denies ICD shocks.   Reports taking Coumadin as prescribed and adherence to anticoagulation based dietary restrictions.  Denies bright red blood per rectum or melena, no dark urine or hematuria.     Past Medical History  Diagnosis Date  . Chronic systolic heart failure     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%)  . Atrial fibrillation -parosysmal      Rx w amiodarone  . Noncompliance     H/O  MEDICAL NONCOMPLIANCE  . 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 Tricuspid valve regurgitation     SEVERE  . Severe mitral regurgitation   . Polymorphic ventricular tachycardia     RECURRENT WITH APPROPRIATE SHOCK THERAPY IN THE PAST  . Ventricular fibrillation     WITH APPROPRIATE SHOCK THERAPY IN THE PAST  . Hypertension   . Gout   . RA (rheumatoid arthritis)   . Automatic implantable cardiac defibrillator -BSX     single chamber  . GI bleed -massive     11 Units 2012  . Elevated LFTs   . H/O hyperthyroidism   . CHF (congestive heart failure)   . AKI (acute kidney injury)   . AICD (automatic cardioverter/defibrillator) present     Current Outpatient Prescriptions  Medication Sig Dispense Refill  . allopurinol (ZYLOPRIM) 300 MG tablet Take 1 tablet (300 mg total) by mouth daily. 30 tablet 6  . amiodarone (PACERONE) 200 MG tablet Take  1 tablet (200 mg total) by mouth 2 (two) times daily. 60 tablet 6  . citalopram (CELEXA) 20 MG tablet Take 1 tablet (20 mg total) by mouth daily. 30 tablet 6  . digoxin (LANOXIN) 0.125 MG tablet Take 1 tablet (0.125 mg total) by mouth daily. 30 tablet 6  . furosemide (LASIX) 40 MG tablet Take 1 tablet (40 mg total) by mouth as needed. 60 tablet 9  . hydrALAZINE (APRESOLINE) 50 MG tablet Take 1 tablet (50 mg total) by mouth every 8 (eight) hours. 90 tablet 6  . milrinone (PRIMACOR) 20 MG/100ML SOLN infusion Inject 9.825 mcg/min into the vein continuous. 100 mL 6  . oxyCODONE (OXY IR/ROXICODONE) 5 MG immediate release tablet Take 1-2 tablets (5-10 mg total) by mouth every 6 (six) hours as needed for severe pain. 30 tablet 0  . oxymetazoline (AFRIN) 0.05 % nasal spray Place 1 spray into both nostrils 2 (two) times daily as needed for congestion (May use decongestant if actively bleeding). 30 mL 0  . pantoprazole (PROTONIX) 40 MG tablet Take 1 tablet (40 mg total) by mouth daily. 30 tablet 6  . sildenafil (REVATIO) 20 MG tablet Take 1 tablet (20 mg total) by mouth 3 (three) times daily. 90 tablet 6  . warfarin (COUMADIN) 2 MG tablet Take 2 mg daily starting 01/09/15 and adjust as needed per LAVD clinic 45 tablet 6  . zolpidem (  AMBIEN) 5 MG tablet Take 1 tablet (5 mg total) by mouth at bedtime as needed for sleep. 30 tablet 0  . colchicine 0.6 MG tablet Take 1 tablet (0.6 mg total) by mouth 2 (two) times daily as needed (as needed for gout pain). 30 tablet 6  . ferrous sulfate 325 (65 FE) MG tablet Take 1 tablet (325 mg total) by mouth 2 (two) times daily with a meal. 60 tablet 3  . pregabalin (LYRICA) 75 MG capsule Take 1 capsule (75 mg total) by mouth 2 (two) times daily. (Patient not taking: Reported on 01/14/2015) 60 capsule 6  . spironolactone (ALDACTONE) 25 MG tablet Take 0.5 tablets (12.5 mg total) by mouth daily. 90 tablet 3   No current facility-administered medications for this encounter.     Review of patient's allergies indicates no known allergies.  REVIEW OF SYSTEMS: All systems negative except as listed in HPI, PMH and Problem list.   LVAD INTERROGATION:   Speed: 9200  Flow: 4.9  Power: 5.4  PI: 7.5  Alarms: none  Events: 1 -8 events daily.   Fixed speed: 9200  Low speed limit: 8600  Primary Controller: Replace back up battery in 32 months.  Back up controller: Replace back up battery in 32 months.   I reviewed the LVAD parameters from today, and compared the results to the patient's prior recorded data.  No programming changes were made.  The LVAD is functioning within specified parameters.  The patient performs LVAD self-test daily.  LVAD interrogation was negative for any significant power changes, alarms or PI events/speed drops.  LVAD equipment check completed and is in good working order.  Back-up equipment present.   LVAD education done on emergency procedures and precautions and reviewed exit site care.    Filed Vitals:   01/14/15 1017  BP: 98/0  Pulse: 65  Height: 5\' 7"  (1.702 m)  Weight: 170 lb (77.111 kg)  SpO2: 96%    Physical Exam: GENERAL: Well appearing, male who presents to clinic today in no acute distress. HEENT: normal  NECK: Supple, JVP 9-10  .  2+ bilaterally, no bruits.  No lymphadenopathy or thyromegaly appreciated.   CARDIAC:  Mechanical heart sounds with LVAD hum present.  LUNGS:  Clear to auscultation bilaterally.  ABDOMEN:  Soft, round, nontender, positive bowel sounds x4.     LVAD exit site:  Dressing dry and intact.  No erythema or drainage.  Stabilization device present and accurately applied.  Driveline dressing is being changed daily per sterile technique. EXTREMITIES:  Warm and dry, no cyanosis, clubbing, rash or 1+ edema  NEUROLOGIC:  Alert and oriented x 4.  Gait steady.  No aphasia.  No dysarthria.  Affect pleasant.      ASSESSMENT AND PLAN:   1. Chronic systolic HF  Status post HMII LVAD implantation 12/20/14 -  Patient comes to clinic today for routine follow up visit. Currently has NYHA class II symptoms on LVAD support. Mild volume overload. - Will add spiro 12.5mg  daily to help with volume and elevated MAPs 2. RV failure - co-ox looks good today. Decrease milrinone to 0.140mcg/kg/min. Continue sildenafil 3. HTN - MAP elevated. Adding spiro 4. Gout - Continue allopurinol. Will restart colchicine  5. Chronic AF - Rate controlled. On coumadin for VAD.  6. Situational depression - Improving. Continue Celexa. Enrolled in Support Group.  7. Anticoagulation management - INR goal 2.0-3.0.  Will evaluate INR value today and follow up with patient for necessary changes.   8. VAD -  parameters reviewed personally. Driveline site looks good.    RTC in 1 week    Keilen Kahl,MD 12:46 AM

## 2015-01-17 ENCOUNTER — Telehealth (HOSPITAL_COMMUNITY): Payer: Self-pay | Admitting: Infectious Diseases

## 2015-01-17 NOTE — Telephone Encounter (Signed)
Left a message requesting call back to either myself or [name] to discuss her role with dressing change care and future health needs while [name] is healing. Would like to organize for her to attend [name]'s next clinic visit so we may perform dressing changes together and teach her how to do them correctly.

## 2015-01-21 ENCOUNTER — Ambulatory Visit (HOSPITAL_COMMUNITY)
Admission: RE | Admit: 2015-01-21 | Discharge: 2015-01-21 | Disposition: A | Discharge status: Home / Self Care | Payer: 59 | Source: Ambulatory Visit | Attending: Internal Medicine | Admitting: Internal Medicine

## 2015-01-21 ENCOUNTER — Ambulatory Visit (HOSPITAL_COMMUNITY)
Admission: RE | Admit: 2015-01-21 | Discharge: 2015-01-21 | Disposition: A | Discharge status: Home / Self Care | Payer: 59 | Source: Ambulatory Visit | Attending: Cardiology | Admitting: Cardiology

## 2015-01-21 ENCOUNTER — Other Ambulatory Visit (HOSPITAL_COMMUNITY): Payer: 59 | Admitting: *Deleted

## 2015-01-21 ENCOUNTER — Ambulatory Visit (HOSPITAL_COMMUNITY): Payer: Self-pay | Admitting: *Deleted

## 2015-01-21 ENCOUNTER — Other Ambulatory Visit (HOSPITAL_COMMUNITY): Payer: Self-pay | Admitting: *Deleted

## 2015-01-21 VITALS — BP 100/0 | HR 73 | Ht 67.0 in | Wt 170.8 lb

## 2015-01-21 DIAGNOSIS — Z95811 Presence of heart assist device: Secondary | ICD-10-CM

## 2015-01-21 DIAGNOSIS — I482 Chronic atrial fibrillation, unspecified: Secondary | ICD-10-CM

## 2015-01-21 DIAGNOSIS — I5022 Chronic systolic (congestive) heart failure: Secondary | ICD-10-CM

## 2015-01-21 DIAGNOSIS — Z5181 Encounter for therapeutic drug level monitoring: Secondary | ICD-10-CM

## 2015-01-21 DIAGNOSIS — F4321 Adjustment disorder with depressed mood: Secondary | ICD-10-CM | POA: Insufficient documentation

## 2015-01-21 DIAGNOSIS — M069 Rheumatoid arthritis, unspecified: Secondary | ICD-10-CM | POA: Diagnosis not present

## 2015-01-21 DIAGNOSIS — Z7901 Long term (current) use of anticoagulants: Secondary | ICD-10-CM | POA: Diagnosis not present

## 2015-01-21 DIAGNOSIS — I50810 Right heart failure, unspecified: Secondary | ICD-10-CM

## 2015-01-21 DIAGNOSIS — M109 Gout, unspecified: Secondary | ICD-10-CM | POA: Diagnosis not present

## 2015-01-21 DIAGNOSIS — M1000 Idiopathic gout, unspecified site: Secondary | ICD-10-CM

## 2015-01-21 DIAGNOSIS — I10 Essential (primary) hypertension: Secondary | ICD-10-CM | POA: Diagnosis not present

## 2015-01-21 DIAGNOSIS — I428 Other cardiomyopathies: Secondary | ICD-10-CM | POA: Insufficient documentation

## 2015-01-21 DIAGNOSIS — Z79899 Other long term (current) drug therapy: Secondary | ICD-10-CM | POA: Diagnosis not present

## 2015-01-21 LAB — BASIC METABOLIC PANEL
ANION GAP: 8 (ref 5–15)
BUN: 9 mg/dL (ref 6–20)
CHLORIDE: 108 mmol/L (ref 101–111)
CO2: 24 mmol/L (ref 22–32)
Calcium: 8.8 mg/dL — ABNORMAL LOW (ref 8.9–10.3)
Creatinine, Ser: 0.91 mg/dL (ref 0.61–1.24)
GFR calc non Af Amer: >60 mL/min (ref >60)
GLUCOSE: 103 mg/dL — AB (ref 65–99)
POTASSIUM: 3.4 mmol/L — AB (ref 3.5–5.1)
Sodium: 140 mmol/L (ref 135–145)

## 2015-01-21 LAB — LACTATE DEHYDROGENASE: LDH: 290 U/L — ABNORMAL HIGH (ref 98–192)

## 2015-01-21 LAB — CARBOXYHEMOGLOBIN
CARBOXYHEMOGLOBIN: 1.6 % — AB (ref 0.5–1.5)
METHEMOGLOBIN: 0.7 % (ref 0.0–1.5)
O2 SAT: 63 %
TOTAL HEMOGLOBIN: 9.1 g/dL — AB (ref 13.5–18.0)

## 2015-01-21 LAB — CBC
HEMATOCRIT: 29.6 % — AB (ref 39.0–52.0)
HEMOGLOBIN: 9 g/dL — AB (ref 13.0–17.0)
MCH: 20.5 pg — AB (ref 26.0–34.0)
MCHC: 30.4 g/dL (ref 30.0–36.0)
MCV: 67.6 fL — AB (ref 78.0–100.0)
Platelets: 388 10*3/uL (ref 150–400)
RBC: 4.38 MIL/uL (ref 4.22–5.81)
RDW: 22.2 % — ABNORMAL HIGH (ref 11.5–15.5)
WBC: 6.8 10*3/uL (ref 4.0–10.5)

## 2015-01-21 LAB — PROTIME-INR
INR: 1.35 (ref 0.00–1.49)
PROTHROMBIN TIME: 16.7 s — AB (ref 11.6–15.2)

## 2015-01-21 MED ORDER — ENOXAPARIN SODIUM 80 MG/0.8ML ~~LOC~~ SOLN: 80.0000 mg | Freq: Two times a day (BID) | SUBCUTANEOUS | Status: DC

## 2015-01-21 MED ORDER — HYDRALAZINE HCL 50 MG PO TABS: 75.0000 mg | ORAL_TABLET | Freq: Three times a day (TID) | ORAL | Status: DC

## 2015-01-21 NOTE — Patient Instructions (Signed)
1.  Stop Ensure 2.  Limit greens to 2 servings weekly.

## 2015-01-21 NOTE — Patient Instructions (Signed)
1.  Start aspirin 81 mg daily. 2.  Increase Hydralazine to 75 mg three times daily. 3.  Only take Lasix as needed for wt gain or swelling. 4.  Start Loveox 80 mg injections twice daily until INR is back up. Increase coumadin to 2 mg except 4 mg on Monday. (Take coumadin in the evenings). Eat greens twice weekly; STOP ENSURE. And we will ask [facility] nurse to re-check INR Thursday.  5.  Return to [place] clinic in one week.

## 2015-01-21 NOTE — Progress Notes (Signed)
Symptom Yes No Details  Angina  x Activity:  Claudication  x How far:  Syncope  x When:  Stroke  x   Orthopnea  x How many pillows: 2 for comfort  PND  x How often:  CPAP  N/A How many hrs:  Pedal edema x  Left ankle starting today - gout flare  Abd fullness  x   N&V  x Good appetite; eating full meals  Diaphoresis  x When:  Bleeding  x No BRBPR or blood in stool  Urine  x Reports clear yellow urine   SOB  x Activity:  Palpitations  x When:  ICD shock  x   Hospitlizaitons  x When/where/why:   ED visit  x When/where/why:  Other MD  x When/who/why:  Activity   Home rehab 2x weekly  Fluid   <2L  Diet   No added salt   Vital Signs: HR: 73 Doppler BP: 100 Automatic BP:  116/72 (90) SPO2: 98 %  Weight: 170.8  lb Last wt:  170 lb  Home wts:  164 - 166 lbs  VAD interrogation & Equipment Management: Speed: 9200 Flow:  5.1 Power:  7.4 PI: 5.4  Alarms:  none Events: 5 - 10 PI events daily.    Fixed speed: 9200 Low speed limit: 8600  Primary Controller:  Replace back up battery in  31 months. Back up controller:   Replace back up battery in  31 months.   I reviewed the LVAD parameters from today and compared the results to the patient's prior recorded data.  LVAD interrogation was NEGATIVE for significant power changes, NEGATIVE for clinical alarms and NEGATIVE for significant PI events/speed drops. PI events occurring at  5 - 13 daily since last clinic visit. No programming changes were made and pump is functioning within specified parameters.   LVAD equipment check completed and is in good working order.  Back-up equipment present. Pt/caregiver deny any alarms or VAD equipment issues.  Exit Site Care: Drive line is being maintained by sister. Denies any fevers, chills, drainage or foul odor. VAD dressing removed and site care performed using sterile technique. Drive line exit site cleaned with Chlora prep applicators x 2, allowed to dry, and gauze dressing with aquacel  strip re-applied. Exit site healing, the velour is fully implanted at exit site. Almost complete tissue ingrowth noted with no redness, tenderness, drainage, or foul odor. Driveline dressing is being changed daily per sterile technique; will advance to every other day next week.   Pt c/o "gout flare" with left ankle and hip pain when he woke this am. Started Colchicine this am.    Patient will ask friend, Lannette Donath to come with him next week to clinic visit for dressing change instructions. Sister is returning to [facility] this Sunday, but will also start working with patient on doing self dressing changes.  Co-Ox today 63.0% on 0.114mcg Milrinone-->D/W Dr. Haroldine Laws; will stop Milrinone today and keep PICC with re-check Co-ox next clinic visit.   INR 1.35  today. Anticoagulation management includes INR goal of 2.0 - 3.0 and ASA 81 mg.  Re-starting ASA today (had held with episodes of epistaxis in hospital). See anticoagulation flow sheet.   VAD coordinator reviewed daily log from home for daily temperature, weight, and VAD parameters. Pt is performing daily controller and system monitor self tests along with completing weekly and monthly maintenance for LVAD equipment.   Patient Instructions: 1.  Start aspirin 81 mg daily. 2.  Increase Hydralazine to  75 mg three times daily. 3.  Only take Lasix as needed for wt gain or swelling. 4.  Start Loveox 80 mg injections twice daily until INR is back up. Increase coumadin to 2 mg except 4 mg on Monday. (Take coumadin in the evenings). Eat greens twice weekly; STOP ENSURE. And we will ask [facility] nurse to re-check INR Thursday.  5.  Return to [place] clinic in one week.   Zada Girt, RN VAD Coordinator   Office: [phone] 24/7 VAD Pager: [phone]

## 2015-01-21 NOTE — Progress Notes (Signed)
Patient ID: [name], male   DOB: [birth year], 44 y.o.   MRN: [id number]  LVAD CLINIC NOTE   HPI:  [name] is a 44 y/o male with systolic HF due to NICM (EF 20-25%) s/p [employer] ICD, chronic AF and gout who underwent HM II VAD implantation on 12/20/14. 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  Follow up for Heart Failure/LVAD:  Returns for follow-up. Continues to do well. At last visit we decreased milrinone to 0.125 and also added spiro 12.5. Continues to feel well. More energy. No edema, orthopnea or PND. Denies SOB or edema. No problems with pump or driveline. Taking all meds as prescribed. Having left ankle pain which he attributes to gout. Taking lasix daily.     Denies LVAD alarms.  Denies driveline trauma, erythema or drainage.  Denies ICD shocks.   Reports taking Coumadin as prescribed and adherence to anticoagulation based dietary restrictions.  Denies bright red blood per rectum or melena, no dark urine or hematuria.     Past Medical History  Diagnosis Date  . Chronic systolic heart failure     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%)  . Atrial fibrillation -parosysmal      Rx w amiodarone  . Noncompliance     H/O  MEDICAL NONCOMPLIANCE  . 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 Tricuspid valve regurgitation     SEVERE  . Severe mitral regurgitation   . Polymorphic ventricular tachycardia     RECURRENT WITH APPROPRIATE SHOCK THERAPY IN THE PAST  . Ventricular fibrillation     WITH APPROPRIATE SHOCK THERAPY IN THE PAST  . Hypertension   . Gout   . RA (rheumatoid arthritis)   . Automatic implantable cardiac defibrillator -BSX     single chamber  . GI bleed -massive     11 Units 2012  . Elevated LFTs   . H/O hyperthyroidism   . CHF (congestive heart failure)   . AKI (acute kidney injury)   . AICD (automatic cardioverter/defibrillator) present     Current Outpatient  Prescriptions  Medication Sig Dispense Refill  . allopurinol (ZYLOPRIM) 300 MG tablet Take 1 tablet (300 mg total) by mouth daily. 30 tablet 6  . amiodarone (PACERONE) 200 MG tablet Take 1 tablet (200 mg total) by mouth 2 (two) times daily. 60 tablet 6  . citalopram (CELEXA) 20 MG tablet Take 1 tablet (20 mg total) by mouth daily. 30 tablet 6  . colchicine 0.6 MG tablet Take 1 tablet (0.6 mg total) by mouth 2 (two) times daily as needed (as needed for gout pain). 30 tablet 6  . digoxin (LANOXIN) 0.125 MG tablet Take 1 tablet (0.125 mg total) by mouth daily. 30 tablet 6  . ferrous sulfate 325 (65 FE) MG tablet Take 1 tablet (325 mg total) by mouth 2 (two) times daily with a meal. 60 tablet 3  . furosemide (LASIX) 40 MG tablet Take 1 tablet (40 mg total) by mouth as needed. 60 tablet 9  . hydrALAZINE (APRESOLINE) 50 MG tablet Take 1.5 tablets (75 mg total) by mouth every 8 (eight) hours. 180 tablet 6  . milrinone (PRIMACOR) 20 MG/100ML SOLN infusion Inject 9.825 mcg/min into the vein continuous. 100 mL 6  . pantoprazole (PROTONIX) 40 MG tablet Take 1 tablet (40 mg total) by mouth daily. 30 tablet 6  . pregabalin (LYRICA) 75 MG capsule Take 1 capsule (75 mg total) by mouth  2 (two) times daily. 60 capsule 6  . sildenafil (REVATIO) 20 MG tablet Take 1 tablet (20 mg total) by mouth 3 (three) times daily. 90 tablet 6  . spironolactone (ALDACTONE) 25 MG tablet Take 0.5 tablets (12.5 mg total) by mouth daily. 90 tablet 3  . warfarin (COUMADIN) 2 MG tablet Take 2 mg daily starting 01/09/15 and adjust as needed per LAVD clinic 45 tablet 6  . zolpidem (AMBIEN) 5 MG tablet Take 1 tablet (5 mg total) by mouth at bedtime as needed for sleep. 30 tablet 0  . enoxaparin (LOVENOX) 80 MG/0.8ML injection Inject 0.8 mLs (80 mg total) into the skin every 12 (twelve) hours. 10 Syringe 3  . oxyCODONE (OXY IR/ROXICODONE) 5 MG immediate release tablet Take 1-2 tablets (5-10 mg total) by mouth every 6 (six) hours as needed for  severe pain. (Patient not taking: Reported on 01/21/2015) 30 tablet 0  . oxymetazoline (AFRIN) 0.05 % nasal spray Place 1 spray into both nostrils 2 (two) times daily as needed for congestion (May use decongestant if actively bleeding). (Patient not taking: Reported on 01/21/2015) 30 mL 0   No current facility-administered medications for this encounter.    Review of patient's allergies indicates no known allergies.  REVIEW OF SYSTEMS: All systems negative except as listed in HPI, PMH and Problem list.   LVAD INTERROGATION:  Speed: 9200 Flow: 5.1 Power: 7.4 PI: 5.4 Alarms: none Events: 5 - 10 PI events daily.   Fixed speed: 9200 Low speed limit: 8600  Primary Controller: Replace back up battery in 31 months. Back up controller: Replace back up battery in 31 months.   I reviewed the LVAD parameters from today, and compared the results to the patient's prior recorded data.  No programming changes were made.  The LVAD is functioning within specified parameters.  The patient performs LVAD self-test daily.  LVAD interrogation was negative for any significant power changes, alarms or PI events/speed drops.  LVAD equipment check completed and is in good working order.  Back-up equipment present.   LVAD education done on emergency procedures and precautions and reviewed exit site care.    Filed Vitals:   01/21/15 1130 01/21/15 1138  BP: 116/72 100/0  Pulse: 73   Height: 5\' 7"  (1.702 m)   Weight: 170 lb 12.8 oz (77.474 kg)   SpO2: 98%     Physical Exam: GENERAL: Well appearing, male who presents to clinic today in no acute distress. HEENT: normal  NECK: Supple, JVP 7 .  2+ bilaterally, no bruits.  No lymphadenopathy or thyromegaly appreciated.   CARDIAC:  Mechanical heart sounds with LVAD hum present.  LUNGS:  Clear to auscultation bilaterally.  ABDOMEN:  Soft, round, nontender, positive bowel sounds x4.     LVAD exit site:  Dressing dry and intact.  No erythema or  drainage.  Stabilization device present and accurately applied.  Driveline dressing is being changed daily per sterile technique. EXTREMITIES:  Warm and dry, no cyanosis, clubbing, rash or edema  NEUROLOGIC:  Alert and oriented x 4.  Gait steady.  No aphasia.  No dysarthria.  Affect pleasant.      ASSESSMENT AND PLAN:   1. Chronic systolic HF  Status post HMII LVAD implantation 12/20/14 - Doing very well. Currently has NYHA class II symptoms on LVAD support. Volume status looks good. Given increasing PI events may be a little dry after addition of spiro. Will stop lasix and only use prn.  2. RV failure - co-ox looks good  today. Stop milrinone. Leave PICC in to recheck co-ox next week. If ok can then remove PICC.  Continue sildenafil 3. HTN - MAP elevated. Increase hydralazine to 75 tid. 4. Gout - Continue allopurinol. Will restart colchicine prn. 5. Chronic AF - Rate controlled. On coumadin for VAD.  6. Situational depression - Improving. Continue Celexa. Enrolled in Support Group.  7. Anticoagulation management - INR goal 2.0-3.0.  Will evaluate INR value today and follow up with patient for necessary changes.   8. VAD - parameters reviewed personally. Driveline site looks good. If PI remains high off milrinone can consider slight increase in VAD speed but LV is small so will need to proceed carefully.   RTC in 1 week   [name], Daniel,MD 7:30 PM

## 2015-01-22 ENCOUNTER — Encounter: Payer: Self-pay | Admitting: *Deleted

## 2015-01-24 ENCOUNTER — Encounter: Payer: 59 | Admitting: Internal Medicine

## 2015-01-24 ENCOUNTER — Ambulatory Visit: Payer: 59 | Admitting: Infectious Diseases

## 2015-01-24 DIAGNOSIS — Z95811 Presence of heart assist device: Secondary | ICD-10-CM

## 2015-01-24 DIAGNOSIS — I482 Chronic atrial fibrillation, unspecified: Secondary | ICD-10-CM

## 2015-01-24 LAB — POCT INR: INR: 1.4

## 2015-01-25 ENCOUNTER — Other Ambulatory Visit (HOSPITAL_COMMUNITY): Payer: Self-pay | Admitting: Infectious Diseases

## 2015-01-25 DIAGNOSIS — I5022 Chronic systolic (congestive) heart failure: Secondary | ICD-10-CM

## 2015-01-25 DIAGNOSIS — Z95811 Presence of heart assist device: Secondary | ICD-10-CM

## 2015-01-25 DIAGNOSIS — Z7901 Long term (current) use of anticoagulants: Secondary | ICD-10-CM

## 2015-01-25 DIAGNOSIS — D649 Anemia, unspecified: Secondary | ICD-10-CM

## 2015-01-28 ENCOUNTER — Other Ambulatory Visit (HOSPITAL_COMMUNITY): Payer: Self-pay | Admitting: *Deleted

## 2015-01-28 ENCOUNTER — Ambulatory Visit (HOSPITAL_COMMUNITY): Payer: Self-pay | Admitting: *Deleted

## 2015-01-28 ENCOUNTER — Encounter: Payer: Self-pay | Admitting: Licensed Clinical Social Worker

## 2015-01-28 ENCOUNTER — Ambulatory Visit (HOSPITAL_COMMUNITY)
Admission: RE | Admit: 2015-01-28 | Discharge: 2015-01-28 | Disposition: A | Discharge status: Home / Self Care | Payer: 59 | Source: Ambulatory Visit | Attending: Internal Medicine | Admitting: Internal Medicine

## 2015-01-28 VITALS — BP 104/0 | HR 75 | Ht 67.0 in | Wt 170.0 lb

## 2015-01-28 DIAGNOSIS — I482 Chronic atrial fibrillation, unspecified: Secondary | ICD-10-CM

## 2015-01-28 DIAGNOSIS — F4321 Adjustment disorder with depressed mood: Secondary | ICD-10-CM | POA: Diagnosis not present

## 2015-01-28 DIAGNOSIS — Z7901 Long term (current) use of anticoagulants: Secondary | ICD-10-CM | POA: Diagnosis not present

## 2015-01-28 DIAGNOSIS — I428 Other cardiomyopathies: Secondary | ICD-10-CM | POA: Insufficient documentation

## 2015-01-28 DIAGNOSIS — Z79899 Other long term (current) drug therapy: Secondary | ICD-10-CM | POA: Diagnosis not present

## 2015-01-28 DIAGNOSIS — M109 Gout, unspecified: Secondary | ICD-10-CM | POA: Insufficient documentation

## 2015-01-28 DIAGNOSIS — Z95811 Presence of heart assist device: Secondary | ICD-10-CM

## 2015-01-28 DIAGNOSIS — I5022 Chronic systolic (congestive) heart failure: Secondary | ICD-10-CM | POA: Diagnosis not present

## 2015-01-28 DIAGNOSIS — D649 Anemia, unspecified: Secondary | ICD-10-CM

## 2015-01-28 DIAGNOSIS — I10 Essential (primary) hypertension: Secondary | ICD-10-CM | POA: Insufficient documentation

## 2015-01-28 DIAGNOSIS — M069 Rheumatoid arthritis, unspecified: Secondary | ICD-10-CM | POA: Diagnosis not present

## 2015-01-28 LAB — CARBOXYHEMOGLOBIN
CARBOXYHEMOGLOBIN: 1.5 % (ref 0.5–1.5)
METHEMOGLOBIN: 0.8 % (ref 0.0–1.5)
O2 Saturation: 52.9 %
TOTAL HEMOGLOBIN: 9.2 g/dL — AB (ref 13.5–18.0)

## 2015-01-28 LAB — CBC
HEMATOCRIT: 29 % — AB (ref 39.0–52.0)
HEMOGLOBIN: 8.9 g/dL — AB (ref 13.0–17.0)
MCH: 20.4 pg — ABNORMAL LOW (ref 26.0–34.0)
MCHC: 30.7 g/dL (ref 30.0–36.0)
MCV: 66.4 fL — ABNORMAL LOW (ref 78.0–100.0)
Platelets: 279 10*3/uL (ref 150–400)
RBC: 4.37 MIL/uL (ref 4.22–5.81)
RDW: 21.6 % — ABNORMAL HIGH (ref 11.5–15.5)
WBC: 5.3 10*3/uL (ref 4.0–10.5)

## 2015-01-28 LAB — COMPREHENSIVE METABOLIC PANEL
ALT: 22 U/L (ref 17–63)
AST: 23 U/L (ref 15–41)
Albumin: 2.9 g/dL — ABNORMAL LOW (ref 3.5–5.0)
Alkaline Phosphatase: 147 U/L — ABNORMAL HIGH (ref 38–126)
Anion gap: 6 (ref 5–15)
BUN: 7 mg/dL (ref 6–20)
CHLORIDE: 108 mmol/L (ref 101–111)
CO2: 26 mmol/L (ref 22–32)
Calcium: 9 mg/dL (ref 8.9–10.3)
Creatinine, Ser: 0.86 mg/dL (ref 0.61–1.24)
GFR calc Af Amer: >60 mL/min (ref >60)
GFR calc non Af Amer: >60 mL/min (ref >60)
Glucose, Bld: 101 mg/dL — ABNORMAL HIGH (ref 65–99)
POTASSIUM: 3.6 mmol/L (ref 3.5–5.1)
Sodium: 140 mmol/L (ref 135–145)
Total Bilirubin: 0.8 mg/dL (ref 0.3–1.2)
Total Protein: 7 g/dL (ref 6.5–8.1)

## 2015-01-28 LAB — LACTATE DEHYDROGENASE: LDH: 282 U/L — ABNORMAL HIGH (ref 98–192)

## 2015-01-28 LAB — MAGNESIUM: Magnesium: 1.7 mg/dL (ref 1.7–2.4)

## 2015-01-28 LAB — PROTIME-INR
INR: 1.91 — ABNORMAL HIGH (ref 0.00–1.49)
PROTHROMBIN TIME: 21.8 s — AB (ref 11.6–15.2)

## 2015-01-28 MED ORDER — LISINOPRIL 10 MG PO TABS: 10.0000 mg | ORAL_TABLET | Freq: Every day | ORAL | Status: DC

## 2015-01-28 NOTE — Progress Notes (Signed)
CSW met with patient in the clinic. Patient stated he is doing well and appears to be adjusting to VAD. CSW briefly discussed VAD Buddy program and to expect a call from Wilhelmina Mcardle (his VAD Buddy). Patient in good spirits and hopeful to attend support group. CSW will continue to follow as needed. Raquel Sarna, Sebastian

## 2015-01-28 NOTE — Progress Notes (Addendum)
Pt presents to VAD clinic with Rosetta (ch  Symptom Yes No Details  Angina  x Activity:  Claudication  x How far:  Syncope  x When:  Stroke  x   Orthopnea  x How many pillows: 2 for comfort  PND  x How often:  CPAP  N/A How many hrs:  Pedal edema  x   Abd fullness  x   N&V  x Good appetite; eating full meals  Diaphoresis  x When:  Bleeding  x No BRBPR or blood in stool  Urine  x Reports clear yellow urine   SOB  x Activity:  Palpitations  x When:  ICD shock  x   Hospitlizaitons  x When/where/why:   ED visit  x When/where/why:  Other MD  x When/who/why:  Activity   Home rehab 2x weekly - may be discharged tomorrow  Fluid   <2L  Diet   No added salt   Vital Signs: HR: 75 Doppler BP:  104 Automatic BP:  143/81 (106) SPO2: 99%  Weight: 170 lbs Last wt:  170.8 lbs  Home wts:  164 - 165 lbs  VAD interrogation & Equipment Management: Speed: 9200 Flow:  4.6 Power:  5.2 PI: 8.2  Alarms:  none Events: 5 - 10 PI events daily.    Fixed speed: 9200 Low speed limit: 8600  Primary Controller:  Replace back up battery in  31 months. Back up controller:   Replace back up battery in  31 months.   I reviewed the LVAD parameters from today and compared the results to the patient's prior recorded data.  LVAD interrogation was NEGATIVE for significant power changes, NEGATIVE for clinical alarms and NEGATIVE for significant PI events/speed drops. PI events occurring at  0 - 5 daily since last clinic visit. No programming changes were made and pump is functioning within specified parameters.   LVAD equipment check completed and is in good working order.  Back-up equipment present. Pt/caregiver deny any alarms or VAD equipment issues.  Exit Site Care: Drive line is being maintained by sister. Denies any fevers, chills, drainage or foul odor. VAD dressing removed and site care performed using sterile technique. Drive line exit site cleaned with Chlora prep applicators x 2, allowed  to dry, and Sorbaview dressing with biopatch on exit site applied. Exit site with near complete tissue ingrowth, the velour is fully implanted at exit site. No redness, tenderness, drainage, or foul odor noted.  New anchor device attached. Driveline dressing is being changed every other day per sterile technique; will advance to weekly dressings.  Advised pt if any tenderness or drainage occurs, call VAD pager for wound check per VAD coordinator. Sisters returned to [place] and will not be back until next weekend for dressing change. Provided patient with weekly dressing kits for home use.   Pt c/o "gout flare" with left ankle and hip pain last week, resolved with two days of colchicine.   Co-Ox today 52.9%;  Milrinone stopped last week 01/21/15 with Co-ox 63 on 1.25 mcg/kg/min. D/W Dr. Aundra Dubin and will d/c PICC line. VAD coordinator called [place], she will pull PICC line this afternoon and discharge pt from [facility].  INR 1.91  today. Anticoagulation management includes INR goal of 2.0 - 3.0 and ASA 81 mg started last week. Lovenox started last Monday thru Saturday (pt reports last dose of Lovenox was Saturday because pharmacy would not refill "too soon"). Refer to anticoagulation flowsheet.   VAD coordinator reviewed daily log  from home for daily temperature, weight, and VAD parameters. Pt is performing daily controller and system monitor self tests along with completing weekly and monthly maintenance for LVAD equipment.   Corene Cornea form [employer] in to interrogate single lead ICD.  History revealed no VT/VF since VAD implant (but would not record unless > 220 BPM).  Therapies on for VF zone 220 bpm; programmed monitor only zone at 180 bpm.  Patient Instructions: 1.  No change in warfarin - continue 2 mg daily (one tablet) except 4 mg (two tabs) on Tues/Thurs/Sat. Will not need Lovenox injections at this time. Re-check INR at [facility] lab (3rd floor) this Friday 02/01/15. 2.  May d/c PICC  line - [facility] nurse will come this afternoon to pull PICC. 3.  Start Lisinopril 10 mg daily, will re-check BMET this Friday at [facility].  4.  May start driving short distances with sister in car with him. 5.  Advance to weekly dressings.  6.  Return to [place] clinic in two weeks.   Zada Girt, RN VAD Coordinator   Office: [phone] 24/7 VAD Pager: [phone]   LVAD CLINIC NOTE   HPI:  [name] is a 44 y/o male with systolic HF due to NICM (EF 20-25%) s/p [employer] ICD, chronic AF and gout who underwent HM II VAD implantation on 12/20/14. 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  Follow up for Heart Failure/LVAD:  Returns for followup visit. Doing very well. Denies SOB or edema. Walking on flat ground without problems. Probably will not need further PT.  No problems with pump or driveline. Taking all meds as prescribed. Now off milrinone. Co-ox 53% today, down from 60% on milrinone.  Denies LVAD alarms.  Denies driveline trauma, erythema or drainage.  Denies ICD shocks.   Reports taking Coumadin as prescribed and adherence to anticoagulation based dietary restrictions.  Denies bright red blood per rectum or melena, no dark urine or hematuria.    Labs (10/16): K 3.6, creatinine 0.86, LDH 282, INR 1.9, HCT 29, Co-ox 53%.  Past Medical History  Diagnosis Date  . Chronic systolic heart failure     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%)  . Atrial fibrillation -parosysmal      Rx w amiodarone  . Noncompliance     H/O  MEDICAL NONCOMPLIANCE  . 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 Tricuspid valve regurgitation     SEVERE  . Severe mitral regurgitation   . Polymorphic ventricular tachycardia     RECURRENT WITH APPROPRIATE SHOCK THERAPY IN THE PAST  . Ventricular fibrillation     WITH APPROPRIATE SHOCK THERAPY IN THE PAST  . Hypertension   . Gout   . RA (rheumatoid arthritis)   . Automatic  implantable cardiac defibrillator -BSX     single chamber  . GI bleed -massive     11 Units 2012  . Elevated LFTs   . H/O hyperthyroidism   . CHF (congestive heart failure)   . AKI (acute kidney injury)   . AICD (automatic cardioverter/defibrillator) present     Current Outpatient Prescriptions  Medication Sig Dispense Refill  . allopurinol (ZYLOPRIM) 300 MG tablet Take 1 tablet (300 mg total) by mouth daily. 30 tablet 6  . amiodarone (PACERONE) 200 MG tablet Take 1 tablet (200 mg total) by mouth 2 (two) times daily. 60 tablet 6  . citalopram (CELEXA) 20 MG tablet Take 1 tablet (20 mg total)  by mouth daily. 30 tablet 6  . colchicine 0.6 MG tablet Take 1 tablet (0.6 mg total) by mouth 2 (two) times daily as needed (as needed for gout pain). 30 tablet 6  . digoxin (LANOXIN) 0.125 MG tablet Take 1 tablet (0.125 mg total) by mouth daily. 30 tablet 6  . ferrous sulfate 325 (65 FE) MG tablet Take 1 tablet (325 mg total) by mouth 2 (two) times daily with a meal. 60 tablet 3  . hydrALAZINE (APRESOLINE) 50 MG tablet Take 1.5 tablets (75 mg total) by mouth every 8 (eight) hours. 180 tablet 6  . pantoprazole (PROTONIX) 40 MG tablet Take 1 tablet (40 mg total) by mouth daily. 30 tablet 6  . pregabalin (LYRICA) 75 MG capsule Take 1 capsule (75 mg total) by mouth 2 (two) times daily. 60 capsule 6  . sildenafil (REVATIO) 20 MG tablet Take 1 tablet (20 mg total) by mouth 3 (three) times daily. 90 tablet 6  . spironolactone (ALDACTONE) 25 MG tablet Take 0.5 tablets (12.5 mg total) by mouth daily. 90 tablet 3  . warfarin (COUMADIN) 2 MG tablet Take 2 mg daily starting 01/09/15 and adjust as needed per LAVD clinic 45 tablet 6  . enoxaparin (LOVENOX) 80 MG/0.8ML injection Inject 0.8 mLs (80 mg total) into the skin every 12 (twelve) hours. (Patient not taking: Reported on 01/28/2015) 10 Syringe 3  . furosemide (LASIX) 40 MG tablet Take 1 tablet (40 mg total) by mouth as needed. (Patient not taking: Reported on  01/28/2015) 60 tablet 9  . lisinopril (PRINIVIL,ZESTRIL) 10 MG tablet Take 1 tablet (10 mg total) by mouth daily. 30 tablet 6  . milrinone (PRIMACOR) 20 MG/100ML SOLN infusion Inject 9.825 mcg/min into the vein continuous. (Patient not taking: Reported on 01/28/2015) 100 mL 6  . zolpidem (AMBIEN) 5 MG tablet Take 1 tablet (5 mg total) by mouth at bedtime as needed for sleep. (Patient not taking: Reported on 01/28/2015) 30 tablet 0   No current facility-administered medications for this encounter.    Review of patient's allergies indicates no known allergies.  REVIEW OF SYSTEMS: All systems negative except as listed in HPI, PMH and Problem list.   LVAD INTERROGATION:  See LVAD nurse note above.  I reviewed the LVAD parameters from today, and compared the results to the patient's prior recorded data.  No programming changes were made.  The LVAD is functioning within specified parameters.  The patient performs LVAD self-test daily.  LVAD interrogation was negative for any significant power changes, alarms or PI events/speed drops.  LVAD equipment check completed and is in good working order.  Back-up equipment present.   LVAD education done on emergency procedures and precautions and reviewed exit site care.    Filed Vitals:   01/28/15 1039 01/28/15 1040  BP: 143/81 104/0  Pulse: 75   Height: 5\' 7"  (1.702 m)   Weight: 170 lb (77.111 kg)   SpO2: 99%     Physical Exam: GENERAL: Well appearing, male who presents to clinic today in no acute distress. HEENT: normal  NECK: Supple, JVP 7 cm .  2+ bilaterally, no bruits.  No lymphadenopathy or thyromegaly appreciated.   CARDIAC:  Mechanical heart sounds with LVAD hum present.  LUNGS:  Clear to auscultation bilaterally.  ABDOMEN:  Soft, round, nontender, positive bowel sounds x4.     LVAD exit site:  Dressing dry and intact.  No erythema or drainage.  Stabilization device present and accurately applied.  Driveline dressing is being changed  daily per sterile technique. EXTREMITIES:  Warm and dry, no cyanosis, clubbing, rash.  No edema.  NEUROLOGIC:  Alert and oriented x 4.  Gait steady.  No aphasia.  No dysarthria.  Affect pleasant.      ASSESSMENT AND PLAN:   1. Chronic systolic HF:  Nonischemic cardiomyopathy.  Status post HMII LVAD implantation 12/20/14.  Patient comes to clinic today for routine follow up visit. Currently has NYHA class II symptoms on LVAD support. Volume looks ok.  He is now off milrinone with co-ox 53%.  This is on the low side but he feels quite good.  I will not restart the milrinone and will let him have the PICC removed.  - Continue spironolactone and digoxin.   - Add lisinopril 10 mg daily with high MAP, BMET in 1 week.  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 Continue Revatio 20 mg tid.  3. HTN: MAP elevated. Adding lisinopril.  4. Gout: Continue allopurinol and colchicine  5. Chronic AF: Rate controlled. On coumadin for VAD.  6. Situational depression: Improving. Continue Celexa. Enrolled in Support Group.  7. Anticoagulation management: INR goal 2.0-3.0.   8. VAD:  parameters reviewed personally. Driveline site looks good.   RTC in 1 week   Dalton McLean,MD 01/28/2015

## 2015-01-28 NOTE — Patient Instructions (Addendum)
1.  No change in warfarin - continue 2 mg daily (one tablet) except 4 mg (two tabs) on Tues/Thurs/Sat. Will not need Lovenox injections at this time. Re-check INR at [facility] lab (3rd floor) this Friday 02/01/15. 2.  May d/c PICC line - [facility] nurse will come this afternoon to pull PICC. 3.  Start Lisinopril 10 mg daily, will re-check BMET this Friday at [facility]  4.  May start driving short distances with sister in car with him. 5.  Advance to weekly dressings.  6.  Return to [place] clinic in two weeks.

## 2015-02-01 ENCOUNTER — Other Ambulatory Visit (INDEPENDENT_AMBULATORY_CARE_PROVIDER_SITE_OTHER): Payer: 59 | Admitting: *Deleted

## 2015-02-01 ENCOUNTER — Ambulatory Visit: Payer: Self-pay | Admitting: Infectious Diseases

## 2015-02-01 DIAGNOSIS — I429 Cardiomyopathy, unspecified: Secondary | ICD-10-CM

## 2015-02-01 DIAGNOSIS — I482 Chronic atrial fibrillation, unspecified: Secondary | ICD-10-CM

## 2015-02-01 DIAGNOSIS — I10 Essential (primary) hypertension: Secondary | ICD-10-CM | POA: Diagnosis not present

## 2015-02-01 DIAGNOSIS — I5022 Chronic systolic (congestive) heart failure: Secondary | ICD-10-CM

## 2015-02-01 DIAGNOSIS — I428 Other cardiomyopathies: Secondary | ICD-10-CM

## 2015-02-01 DIAGNOSIS — I472 Ventricular tachycardia, unspecified: Secondary | ICD-10-CM

## 2015-02-01 DIAGNOSIS — I9589 Other hypotension: Secondary | ICD-10-CM

## 2015-02-01 LAB — BASIC METABOLIC PANEL
BUN: 14 mg/dL (ref 7–25)
CHLORIDE: 102 mmol/L (ref 98–110)
CO2: 22 mmol/L (ref 20–31)
Calcium: 9.5 mg/dL (ref 8.6–10.3)
Creat: 0.92 mg/dL (ref 0.60–1.35)
GLUCOSE: 92 mg/dL (ref 65–99)
POTASSIUM: 3.6 mmol/L (ref 3.5–5.3)
Sodium: 137 mmol/L (ref 135–146)

## 2015-02-01 LAB — PROTIME-INR
INR: 2.25 — AB (ref <1.50)
Prothrombin Time: 25.2 seconds — ABNORMAL HIGH (ref 11.6–15.2)

## 2015-02-01 NOTE — Addendum Note (Signed)
Addended by: Eulis Foster on: 02/01/2015 11:32 AM   Modules accepted: Orders

## 2015-02-07 ENCOUNTER — Telehealth: Payer: Self-pay | Admitting: Licensed Clinical Social Worker

## 2015-02-07 NOTE — Telephone Encounter (Signed)
CSW contacted patient by phone to follow up. Patient reports this is his second week home by himself and "I'm doing good". Patient reports his sister left her dog behind and patient has been walking everyday with the dog and feeling well. Patient denies any concerns at the moment. Patient asked about retuning to work as his FMLA is up mid November. Patient currently on short term disability and will discuss further with MD and VAD Coordinators about feasibility of returning to work in the near future. CSW continues to follow for support. Raquel Sarna, [place]

## 2015-02-08 ENCOUNTER — Other Ambulatory Visit (INDEPENDENT_AMBULATORY_CARE_PROVIDER_SITE_OTHER): Payer: 59 | Admitting: *Deleted

## 2015-02-08 DIAGNOSIS — I5022 Chronic systolic (congestive) heart failure: Secondary | ICD-10-CM

## 2015-02-08 DIAGNOSIS — I9589 Other hypotension: Secondary | ICD-10-CM

## 2015-02-08 DIAGNOSIS — I482 Chronic atrial fibrillation, unspecified: Secondary | ICD-10-CM

## 2015-02-08 DIAGNOSIS — I10 Essential (primary) hypertension: Secondary | ICD-10-CM | POA: Diagnosis not present

## 2015-02-08 DIAGNOSIS — I472 Ventricular tachycardia, unspecified: Secondary | ICD-10-CM

## 2015-02-08 DIAGNOSIS — I428 Other cardiomyopathies: Secondary | ICD-10-CM

## 2015-02-08 LAB — PROTIME-INR
INR: 2.64 — AB (ref <1.50)
PROTHROMBIN TIME: 28.6 s — AB (ref 11.6–15.2)

## 2015-02-11 ENCOUNTER — Ambulatory Visit (HOSPITAL_COMMUNITY): Payer: Self-pay | Admitting: Infectious Diseases

## 2015-02-12 ENCOUNTER — Other Ambulatory Visit (HOSPITAL_COMMUNITY): Payer: Self-pay | Admitting: *Deleted

## 2015-02-12 DIAGNOSIS — Z7901 Long term (current) use of anticoagulants: Secondary | ICD-10-CM

## 2015-02-12 DIAGNOSIS — Z95811 Presence of heart assist device: Secondary | ICD-10-CM

## 2015-02-13 ENCOUNTER — Ambulatory Visit (HOSPITAL_COMMUNITY)
Admission: RE | Admit: 2015-02-13 | Discharge: 2015-02-13 | Disposition: A | Discharge status: Home / Self Care | Payer: 59 | Source: Ambulatory Visit | Attending: Cardiology | Admitting: Cardiology

## 2015-02-13 ENCOUNTER — Ambulatory Visit (HOSPITAL_COMMUNITY): Payer: Self-pay | Admitting: *Deleted

## 2015-02-13 ENCOUNTER — Encounter: Payer: Self-pay | Admitting: Licensed Clinical Social Worker

## 2015-02-13 DIAGNOSIS — Z95811 Presence of heart assist device: Secondary | ICD-10-CM

## 2015-02-13 DIAGNOSIS — I5022 Chronic systolic (congestive) heart failure: Secondary | ICD-10-CM | POA: Insufficient documentation

## 2015-02-13 DIAGNOSIS — I428 Other cardiomyopathies: Secondary | ICD-10-CM | POA: Insufficient documentation

## 2015-02-13 DIAGNOSIS — M069 Rheumatoid arthritis, unspecified: Secondary | ICD-10-CM | POA: Diagnosis not present

## 2015-02-13 DIAGNOSIS — F4321 Adjustment disorder with depressed mood: Secondary | ICD-10-CM | POA: Insufficient documentation

## 2015-02-13 DIAGNOSIS — M109 Gout, unspecified: Secondary | ICD-10-CM | POA: Insufficient documentation

## 2015-02-13 DIAGNOSIS — Z7901 Long term (current) use of anticoagulants: Secondary | ICD-10-CM

## 2015-02-13 DIAGNOSIS — I110 Hypertensive heart disease with heart failure: Secondary | ICD-10-CM | POA: Insufficient documentation

## 2015-02-13 DIAGNOSIS — Z7982 Long term (current) use of aspirin: Secondary | ICD-10-CM | POA: Diagnosis not present

## 2015-02-13 DIAGNOSIS — Z79899 Other long term (current) drug therapy: Secondary | ICD-10-CM | POA: Diagnosis not present

## 2015-02-13 DIAGNOSIS — I482 Chronic atrial fibrillation: Secondary | ICD-10-CM | POA: Insufficient documentation

## 2015-02-13 LAB — BASIC METABOLIC PANEL
Anion gap: 10 (ref 5–15)
BUN: 13 mg/dL (ref 6–20)
CHLORIDE: 108 mmol/L (ref 101–111)
CO2: 23 mmol/L (ref 22–32)
Calcium: 8.9 mg/dL (ref 8.9–10.3)
Creatinine, Ser: 1.07 mg/dL (ref 0.61–1.24)
Glucose, Bld: 79 mg/dL (ref 65–99)
POTASSIUM: 3.4 mmol/L — AB (ref 3.5–5.1)
SODIUM: 141 mmol/L (ref 135–145)

## 2015-02-13 LAB — PROTIME-INR
INR: 2.97 — AB (ref 0.00–1.49)
Prothrombin Time: 30.4 seconds — ABNORMAL HIGH (ref 11.6–15.2)

## 2015-02-13 LAB — CBC
HEMATOCRIT: 33.6 % — AB (ref 39.0–52.0)
Hemoglobin: 10 g/dL — ABNORMAL LOW (ref 13.0–17.0)
MCH: 19.4 pg — ABNORMAL LOW (ref 26.0–34.0)
MCHC: 29.8 g/dL — ABNORMAL LOW (ref 30.0–36.0)
MCV: 65.1 fL — AB (ref 78.0–100.0)
PLATELETS: 406 10*3/uL — AB (ref 150–400)
RBC: 5.16 MIL/uL (ref 4.22–5.81)
RDW: 21.7 % — AB (ref 11.5–15.5)
WBC: 5.7 10*3/uL (ref 4.0–10.5)

## 2015-02-13 LAB — LACTATE DEHYDROGENASE: LDH: 260 U/L — AB (ref 98–192)

## 2015-02-13 NOTE — Patient Instructions (Signed)
1.  No change in meds. 2.  No change in coumadin dose; re-check INR in one week at [facility] (02/20/15). 3.  Return to [place] clinic in 3 weeks.

## 2015-02-13 NOTE — Progress Notes (Signed)
Patient ID: [name], male   DOB: 1970-09-21, 44 y.o.   MRN: [id number]   LVAD CLINIC NOTE  CHF: [name]  HPI:  [name] is a 44 y/o male with systolic HF due to NICM (EF 20-25%) s/p [employer] ICD, chronic AF and gout who underwent HM II VAD implantation on 12/20/14. 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  Follow up for Heart Failure/LVAD:  Returns for follow-up. Continues to improve. Now off milrinone. Able to get around better. Doing all ADLs without difficulty. No edema, orthopnea or PND. Denies SOB or edema. No problems with pump or driveline. Taking all meds as prescribed. Had an episode of gastroenteritis earlier this week and lost 7 pounds. Associated with multiple PI events. Now better. Taking lasix only prn.  Denies LVAD alarms.  Denies driveline trauma, erythema or drainage.  Denies ICD shocks.   Reports taking Coumadin as prescribed and adherence to anticoagulation based dietary restrictions.  Denies bright red blood per rectum or melena, no dark urine or hematuria.     Past Medical History  Diagnosis Date  . Chronic systolic heart failure     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%)  . Atrial fibrillation -parosysmal      Rx w amiodarone  . Noncompliance     H/O  MEDICAL NONCOMPLIANCE  . 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 Tricuspid valve regurgitation     SEVERE  . Severe mitral regurgitation   . Polymorphic ventricular tachycardia     RECURRENT WITH APPROPRIATE SHOCK THERAPY IN THE PAST  . Ventricular fibrillation     WITH APPROPRIATE SHOCK THERAPY IN THE PAST  . Hypertension   . Gout   . RA (rheumatoid arthritis)   . Automatic implantable cardiac defibrillator -BSX     single chamber  . GI bleed -massive     11 Units 2012  . Elevated LFTs   . H/O hyperthyroidism   . CHF (congestive heart failure)   . AKI (acute kidney injury)   . AICD (automatic  cardioverter/defibrillator) present     Current Outpatient Prescriptions  Medication Sig Dispense Refill  . allopurinol (ZYLOPRIM) 300 MG tablet Take 1 tablet (300 mg total) by mouth daily. 30 tablet 6  . amiodarone (PACERONE) 200 MG tablet Take 1 tablet (200 mg total) by mouth 2 (two) times daily. 60 tablet 6  . aspirin EC 81 MG tablet Take 81 mg by mouth daily.    . citalopram (CELEXA) 20 MG tablet Take 1 tablet (20 mg total) by mouth daily. 30 tablet 6  . digoxin (LANOXIN) 0.125 MG tablet Take 1 tablet (0.125 mg total) by mouth daily. 30 tablet 6  . ferrous sulfate 325 (65 FE) MG tablet Take 1 tablet (325 mg total) by mouth 2 (two) times daily with a meal. 60 tablet 3  . hydrALAZINE (APRESOLINE) 50 MG tablet Take 1.5 tablets (75 mg total) by mouth every 8 (eight) hours. 180 tablet 6  . lisinopril (PRINIVIL,ZESTRIL) 10 MG tablet Take 1 tablet (10 mg total) by mouth daily. 30 tablet 6  . pantoprazole (PROTONIX) 40 MG tablet Take 1 tablet (40 mg total) by mouth daily. 30 tablet 6  . pregabalin (LYRICA) 75 MG capsule Take 1 capsule (75 mg total) by mouth 2 (two) times daily. 60 capsule 6  . sildenafil (REVATIO) 20 MG tablet Take 1 tablet (20 mg total) by mouth 3 (three) times daily. 90 tablet  6  . spironolactone (ALDACTONE) 25 MG tablet Take 0.5 tablets (12.5 mg total) by mouth daily. 90 tablet 3  . warfarin (COUMADIN) 2 MG tablet Take 2 mg daily starting 01/09/15 and adjust as needed per LAVD clinic 45 tablet 6  . colchicine 0.6 MG tablet Take 1 tablet (0.6 mg total) by mouth 2 (two) times daily as needed (as needed for gout pain). (Patient not taking: Reported on 02/13/2015) 30 tablet 6  . furosemide (LASIX) 40 MG tablet Take 1 tablet (40 mg total) by mouth as needed. (Patient not taking: Reported on 01/28/2015) 60 tablet 9  . zolpidem (AMBIEN) 5 MG tablet Take 1 tablet (5 mg total) by mouth at bedtime as needed for sleep. (Patient not taking: Reported on 02/13/2015) 30 tablet 0   No current  facility-administered medications for this encounter.    Review of patient's allergies indicates no known allergies.  REVIEW OF SYSTEMS: All systems negative except as listed in HPI, PMH and Problem list.   VAD interrogation & Equipment Management: Speed: 9200 Flow: 3.6 Power: 54.9 PI: 87.4  Alarms: none Events: PI events: 10/25: 31; 10/24: 58; 10/19 - 10/23: 5 - 20; 10/18: 40  Fixed speed: 9200 Low speed limit: 8600  Primary Controller: Replace back up battery in 31 months. Back up controller: Replace back up battery in 31 months.     I reviewed the LVAD parameters from today, and compared the results to the patient's prior recorded data.  No programming changes were made.  The LVAD is functioning within specified parameters.  The patient performs LVAD self-test daily.  LVAD interrogation was negative for any significant power changes, alarms or PI events/speed drops.  LVAD equipment check completed and is in good working order.  Back-up equipment present.   LVAD education done on emergency procedures and precautions and reviewed exit site care.    Filed Vitals:   02/13/15 1130 02/13/15 1135  BP: 110/64 104/0  Pulse: 72   Height: 5\' 7"  (1.702 m)   Weight: 163 lb (73.936 kg)   SpO2: 100%    Vital Signs: HR: 72 Doppler BP: 104 Automatic BP: 110/64 (84) SPO2: 100%  Weight: 163 lbs Last wt: 170 lbs  Physical Exam: GENERAL: Well appearing, male who presents to clinic today in no acute distress. HEENT: normal  NECK: Supple, JVP 5 .  2+ bilaterally, no bruits.  No lymphadenopathy or thyromegaly appreciated.   CARDIAC:  Mechanical heart sounds with LVAD hum present.  LUNGS:  Clear to auscultation bilaterally.  ABDOMEN:  Soft, round, nontender, positive bowel sounds x4.     LVAD exit site:  Dressing dry and intact.  No erythema or drainage.  Stabilization device present and accurately applied.  Driveline dressing is being changed daily per sterile  technique. EXTREMITIES:  Warm and dry, no cyanosis, clubbing, rash or edema  NEUROLOGIC:  Alert and oriented x 4.  Gait steady.  No aphasia.  No dysarthria.  Affect pleasant.      ASSESSMENT AND PLAN:   1. Chronic systolic HF  Status post HMII LVAD implantation 12/20/14 - Doing very well. Currently has NYHA class II symptoms on LVAD support. Volume status looks good off lasix. Increasing PI events likely due to being a little dry after GI bug. Will continue current settings. 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  Continue sildenafil 3. HTN - MAP looks good. Continue current regimen. 4. Gout - Well controlled. Continue allopurinol. Continue colchicine prn. 5. Chronic AF - Rate controlled. On  coumadin for VAD.  6. Situational depression - Improving. Continue Celexa. Enrolled in Support Group.  7. Anticoagulation management - INR goal 2.0-3.0.  Will evaluate INR value today and follow up with patient for necessary changes.   8. VAD - parameters reviewed personally. Driveline site looks good.  RTC in 3 week   Jiovany Scheffel,MD 9:49 PM

## 2015-02-13 NOTE — Progress Notes (Signed)
   Symptom Yes No Details  Angina  x Activity:  Claudication  x How far:  Syncope  x When:  Stroke  x   Orthopnea  x How many pillows: 2 for comfort  PND  x How often:  CPAP  N/A How many hrs:  Pedal edema  x   Abd fullness  x   N&V  x Good appetite; eating full meals except for Monday  Diaphoresis  x When:  Bleeding  x No BRBPR or blood in stool  Urine  x clear yellow urine   SOB  x Activity:  Palpitations  x When:  ICD shock  x   Hospitlizaitons  x When/where/why:   ED visit  x When/where/why:  Other MD  x When/who/why:  Activity   Outside walking; cooking; light cleaning; exercises recommended by home PT (home PT discharged 2 weeks ago)  Fluid   <2L  Diet   No added salt   Vital Signs: HR: 72 Doppler BP:  104 Automatic BP:  110/64 (84) SPO2: 100%  Weight: 163  lbs Last wt:  170 lbs  Home wts:  159 - 153 - 159 lbs  VAD interrogation & Equipment Management: Speed: 9200 Flow:  3.6 Power:  54.9 PI: 87.4  Alarms:  none Events: PI events:  10/25: 31;  10/24: 58;  10/19 - 10/23: 5 - 20;  10/18: 40  Fixed speed: 9200 Low speed limit: 8600  Primary Controller:  Replace back up battery in  31 months. Back up controller:   Replace back up battery in  31 months.   I reviewed the LVAD parameters from today and compared the results to the patient's prior recorded data.  LVAD interrogation was NEGATIVE for significant power changes, NEGATIVE for clinical alarms and Positive for significant PI events/speed drops. PI events occurring at 5 - 58 daily since last clinic visit. No programming changes were made and pump is functioning within specified parameters.   LVAD equipment check completed and is in good working order.  Back-up equipment present. Pt/caregiver deny any alarms or VAD equipment issues.  Exit Site Care: Drive line is being maintained by sister. Denies any fevers, chills, drainage or foul odor. VAD dressing removed and site care performed using sterile  technique. Drive line exit site cleaned with Chlora prep applicators x 2, allowed to dry, and  Sorbaview dressing with biopatch on exit site applied. Exit site with complete tissue ingrowth, the velour is fully implanted at exit site. No redness, tenderness, drainage, or foul odor noted.  New anchor device attached. Driveline dressing is being changed weekly per sterile technique.   VAD coordinator reviewed daily log from home for daily temperature, weight, and VAD parameters. Pt is performing daily controller and system monitor self tests along with completing weekly and monthly maintenance for LVAD equipment.   Pt reports 7 lb wt loss earlier this week with episodes of nausea and diarrhea (felt it was food related). Increased # of PI events, pt is not taking any prn diuretics. Encouraged to increase fluid and salt intake per Dr. Haroldine Laws.   Patient Instructions: 1.  No change in meds. 2.  No change in coumadin dose; re-check INR in one week at [facility] (02/20/15). 3.  Return to [place] clinic in 3 weeks.   Zada Girt, RN VAD Coordinator  Office: [phone] 24/7 VAD Pager: [phone]

## 2015-02-13 NOTE — Progress Notes (Signed)
CSW met with patient in the clinic. Patient was in great spirits and stated he is adjusting well to life with a VAD. He spoke of difficult times and his struggle through the adjustment but noted "I am good now". Patient was asking about returning to work and regaining his life. Patient appears to be doing well with adjustment to VAD life. Patient plans to attend support group next month as he noted that "it really helps". CSW will continue to follow for supportive intervention. Raquel Sarna, [place]

## 2015-02-19 ENCOUNTER — Encounter: Payer: Self-pay | Admitting: *Deleted

## 2015-02-20 ENCOUNTER — Ambulatory Visit (HOSPITAL_COMMUNITY): Payer: Self-pay | Admitting: Infectious Diseases

## 2015-02-20 ENCOUNTER — Other Ambulatory Visit (INDEPENDENT_AMBULATORY_CARE_PROVIDER_SITE_OTHER): Payer: 59 | Admitting: *Deleted

## 2015-02-20 DIAGNOSIS — I482 Chronic atrial fibrillation, unspecified: Secondary | ICD-10-CM

## 2015-02-20 DIAGNOSIS — I472 Ventricular tachycardia, unspecified: Secondary | ICD-10-CM

## 2015-02-20 DIAGNOSIS — I10 Essential (primary) hypertension: Secondary | ICD-10-CM

## 2015-02-20 DIAGNOSIS — I9589 Other hypotension: Secondary | ICD-10-CM

## 2015-02-20 LAB — PROTIME-INR
INR: 2.33 — AB (ref <1.50)
Prothrombin Time: 25.9 seconds — ABNORMAL HIGH (ref 11.6–15.2)

## 2015-02-20 NOTE — Addendum Note (Signed)
Addended by: Eulis Foster on: 02/20/2015 12:01 PM   Modules accepted: Orders

## 2015-03-01 ENCOUNTER — Ambulatory Visit (HOSPITAL_COMMUNITY)
Admission: RE | Admit: 2015-03-01 | Discharge: 2015-03-01 | Disposition: A | Discharge status: Home / Self Care | Payer: 59 | Source: Ambulatory Visit | Attending: Cardiology | Admitting: Cardiology

## 2015-03-01 DIAGNOSIS — Z4800 Encounter for change or removal of nonsurgical wound dressing: Secondary | ICD-10-CM

## 2015-03-01 NOTE — Progress Notes (Cosign Needed Addendum)
LVAD Exit Site: VAD dressing removed and site care performed using sterile technique. Drive line exit site cleaned with Chlora prep applicators x 2, allowed to dry, and Sorbaview dressing with biopatch re-applied. Exit site well healed and incorporated. Small scab that was removed easily during cleansing process. No bleeding. The velour is fully implanted at exit site. No redness, tenderness, drainage, or foul odor noted. Drive line anchor re-applied. Pt denies fever or chills. Driveline dressing is being changed weekly per sterile technique. Requesting to learn to shower--will discuss at upcoming VAD visit next week.   LVAD equipment check completed and is in good working order. Back-up equipment present. LVAD education done on emergency procedures and precautions and reviewed exit site care.   [name], RN VAD Coordinator   Office: [phone] 24/7 VAD Pager: [phone]

## 2015-03-04 ENCOUNTER — Other Ambulatory Visit (HOSPITAL_COMMUNITY): Payer: Self-pay | Admitting: Infectious Diseases

## 2015-03-04 DIAGNOSIS — Z95811 Presence of heart assist device: Secondary | ICD-10-CM

## 2015-03-06 ENCOUNTER — Ambulatory Visit (HOSPITAL_COMMUNITY)
Admission: RE | Admit: 2015-03-06 | Discharge: 2015-03-06 | Disposition: A | Discharge status: Home / Self Care | Payer: 59 | Source: Ambulatory Visit | Attending: Cardiology | Admitting: Cardiology

## 2015-03-06 ENCOUNTER — Ambulatory Visit (HOSPITAL_COMMUNITY): Payer: Self-pay | Admitting: *Deleted

## 2015-03-06 ENCOUNTER — Other Ambulatory Visit (HOSPITAL_COMMUNITY): Payer: Self-pay | Admitting: *Deleted

## 2015-03-06 ENCOUNTER — Encounter (HOSPITAL_COMMUNITY): Payer: Self-pay | Admitting: *Deleted

## 2015-03-06 VITALS — BP 104/0 | HR 82 | Ht 67.0 in | Wt 165.0 lb

## 2015-03-06 DIAGNOSIS — Z7901 Long term (current) use of anticoagulants: Secondary | ICD-10-CM | POA: Diagnosis not present

## 2015-03-06 DIAGNOSIS — F4321 Adjustment disorder with depressed mood: Secondary | ICD-10-CM | POA: Diagnosis not present

## 2015-03-06 DIAGNOSIS — Z7982 Long term (current) use of aspirin: Secondary | ICD-10-CM | POA: Insufficient documentation

## 2015-03-06 DIAGNOSIS — M069 Rheumatoid arthritis, unspecified: Secondary | ICD-10-CM | POA: Insufficient documentation

## 2015-03-06 DIAGNOSIS — I428 Other cardiomyopathies: Secondary | ICD-10-CM | POA: Insufficient documentation

## 2015-03-06 DIAGNOSIS — Z95811 Presence of heart assist device: Secondary | ICD-10-CM | POA: Diagnosis not present

## 2015-03-06 DIAGNOSIS — I5022 Chronic systolic (congestive) heart failure: Secondary | ICD-10-CM | POA: Insufficient documentation

## 2015-03-06 DIAGNOSIS — M109 Gout, unspecified: Secondary | ICD-10-CM | POA: Diagnosis not present

## 2015-03-06 DIAGNOSIS — Z79899 Other long term (current) drug therapy: Secondary | ICD-10-CM | POA: Diagnosis not present

## 2015-03-06 DIAGNOSIS — I110 Hypertensive heart disease with heart failure: Secondary | ICD-10-CM | POA: Diagnosis not present

## 2015-03-06 DIAGNOSIS — I482 Chronic atrial fibrillation: Secondary | ICD-10-CM | POA: Insufficient documentation

## 2015-03-06 DIAGNOSIS — I5023 Acute on chronic systolic (congestive) heart failure: Secondary | ICD-10-CM | POA: Diagnosis not present

## 2015-03-06 LAB — BASIC METABOLIC PANEL
ANION GAP: 6 (ref 5–15)
BUN: 9 mg/dL (ref 6–20)
CALCIUM: 8.6 mg/dL — AB (ref 8.9–10.3)
CO2: 24 mmol/L (ref 22–32)
Chloride: 110 mmol/L (ref 101–111)
Creatinine, Ser: 0.94 mg/dL (ref 0.61–1.24)
Glucose, Bld: 85 mg/dL (ref 65–99)
Potassium: 3.6 mmol/L (ref 3.5–5.1)
SODIUM: 140 mmol/L (ref 135–145)

## 2015-03-06 LAB — CBC
HCT: 31.1 % — ABNORMAL LOW (ref 39.0–52.0)
HEMOGLOBIN: 9.6 g/dL — AB (ref 13.0–17.0)
MCH: 19.8 pg — AB (ref 26.0–34.0)
MCHC: 30.9 g/dL (ref 30.0–36.0)
MCV: 64.1 fL — ABNORMAL LOW (ref 78.0–100.0)
Platelets: 403 10*3/uL — ABNORMAL HIGH (ref 150–400)
RBC: 4.85 MIL/uL (ref 4.22–5.81)
RDW: 21.3 % — ABNORMAL HIGH (ref 11.5–15.5)
WBC: 5 10*3/uL (ref 4.0–10.5)

## 2015-03-06 LAB — PROTIME-INR
INR: 2.68 — AB (ref 0.00–1.49)
PROTHROMBIN TIME: 28.1 s — AB (ref 11.6–15.2)

## 2015-03-06 LAB — LACTATE DEHYDROGENASE: LDH: 272 U/L — AB (ref 98–192)

## 2015-03-06 NOTE — Progress Notes (Addendum)
Symptom Yes No Details  Angina  x Activity:  Claudication  x How far:  Syncope  x When:  Stroke  x   Orthopnea  x How many pillows: 2 for comfort  PND  x How often:  CPAP  N/A How many hrs:  Pedal edema  x   Abd fullness  x   N&V  x Good appetite; eating full meals except for Monday  Diaphoresis  x When:  Bleeding  x No BRBPR or blood in stool  Urine  x clear yellow urine   SOB  x Activity:  Palpitations  x When:  ICD shock  x   Hospitlizaitons  x When/where/why:   ED visit  x When/where/why:  Other MD  x When/who/why:  Activity   Outside walking; cooking; light cleaning; exercises recommended by home PT (home PT discharged 2 weeks ago)  Fluid   <2L  Diet   No added salt   Vital Signs: HR: 82 Doppler BP:  104 Automatic BP:  115/67 (89) SPO2: 100%  Weight: 165  lbs Last wt:  163 bs  Home wts:  155 - 152.2 - 158.8 lbs  VAD interrogation & Equipment Management: Speed: 9200 Flow:   4.4 Power:  5.1 PI:  7.8 Alarms:  none Events: PI events:  0 - 10 daily; 20 PI events on 11/5 and 11/6 Primary Controller:  Replace back up battery in 30 months. Back up controller:   Replace back up battery in  30 months.   I reviewed the LVAD parameters from today and compared the results to the patient's prior recorded data.  LVAD interrogation was NEGATIVE for significant power changes, NEGATIVE for clinical alarms and Negative for significant PI events/speed drops. PI events down from last clinic visit.  No programming changes were made and pump is functioning within specified parameters.   LVAD equipment check completed and is in good working order.  Back-up equipment present. Pt/caregiver deny any alarms or VAD equipment issues.  Exit Site Care: Drive line is being maintained by sister. Denies any fevers, chills, drainage or foul odor. VAD dressing removed and site care performed using sterile technique. Drive line exit site cleaned with Chlora prep applicators x 2, allowed to dry,  and  Sorbaview dressing with biopatch on exit site applied. Exit site with complete tissue ingrowth, the velour is fully implanted at exit site. No redness, tenderness, drainage, or foul odor noted.  New anchor device attached. Driveline dressing is being changed weekly per sterile technique. Pt wants to begin showering; exit site completely healed.  Advised pt to hydrate well before first few showers and have a caregiver nearby. Demonstrated proper use of shower bag, instructed pt to cover dressing with plastic wrap to keep dressing dry. If dressing becomes wet then he must change dressing. Emphasized keeping dressing dry and intact. Advised pt to call if any change in exit site, including redness, tenderness, drainage, or foul odor. Pt verbalized understanding of same.   VAD coordinator reviewed daily log from home for daily temperature, weight, and VAD parameters. Pt is performing daily controller and system monitor self tests along with completing weekly and monthly maintenance for LVAD equipment.    Patient Instructions: 1.  No change in meds. 2.  Will call you with INR results and coumadin dosing. 3.  Return to [facility] in one month. 4.  Will place referral for cardiac rehab.  Zada Girt, RN VAD Coordinator  Office: [phone] 24/7 VAD Pager: [phone]  Patient  ID: [name], male   DOB: 1970/05/14, 44 y.o.   MRN: [id number]   LVAD CLINIC NOTE  CHF: [name]  HPI:  [name] is a 44 y/o male with systolic HF due to NICM (EF 20-25%) s/p [employer] ICD, chronic AF and gout who underwent HM II VAD implantation on 12/20/14. 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  Follow up for Heart Failure/LVAD:  Returns for follow-up. Continues to improve. Now off milrinone. Able to get around better. Doing all ADLs without difficulty. No edema, orthopnea or PND. Denies SOB or edema. No problems with pump or driveline. Taking all meds as prescribed.  0-10  PIs/day (less than in the past).  Walking around the block without problems.  Shoulder and neck pain mostly resolved.   Denies LVAD alarms.  Denies driveline trauma, erythema or drainage.  Denies ICD shocks.   Reports taking Coumadin as prescribed and adherence to anticoagulation based dietary restrictions.  Denies bright red blood per rectum or melena, no dark urine or hematuria.    Past Medical History  Diagnosis Date  . Chronic systolic heart failure     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%)  . Atrial fibrillation -parosysmal      Rx w amiodarone  . Noncompliance     H/O  MEDICAL NONCOMPLIANCE  . 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 Tricuspid valve regurgitation     SEVERE  . Severe mitral regurgitation   . Polymorphic ventricular tachycardia     RECURRENT WITH APPROPRIATE SHOCK THERAPY IN THE PAST  . Ventricular fibrillation     WITH APPROPRIATE SHOCK THERAPY IN THE PAST  . Hypertension   . Gout   . RA (rheumatoid arthritis)   . Automatic implantable cardiac defibrillator -BSX     single chamber  . GI bleed -massive     11 Units 2012  . Elevated LFTs   . H/O hyperthyroidism   . CHF (congestive heart failure)   . AKI (acute kidney injury)   . AICD (automatic cardioverter/defibrillator) present     Current Outpatient Prescriptions  Medication Sig Dispense Refill  . allopurinol (ZYLOPRIM) 300 MG tablet Take 1 tablet (300 mg total) by mouth daily. 30 tablet 6  . amiodarone (PACERONE) 200 MG tablet Take 1 tablet (200 mg total) by mouth 2 (two) times daily. 60 tablet 6  . aspirin EC 81 MG tablet Take 81 mg by mouth daily.    . citalopram (CELEXA) 20 MG tablet Take 1 tablet (20 mg total) by mouth daily. 30 tablet 6  . digoxin (LANOXIN) 0.125 MG tablet Take 1 tablet (0.125 mg total) by mouth daily. 30 tablet 6  . ferrous sulfate 325 (65 FE) MG tablet Take 1 tablet (325 mg total) by mouth 2 (two) times daily with a meal. 60  tablet 3  . hydrALAZINE (APRESOLINE) 50 MG tablet Take 1.5 tablets (75 mg total) by mouth every 8 (eight) hours. 180 tablet 6  . lisinopril (PRINIVIL,ZESTRIL) 10 MG tablet Take 1 tablet (10 mg total) by mouth daily. 30 tablet 6  . pantoprazole (PROTONIX) 40 MG tablet Take 1 tablet (40 mg total) by mouth daily. 30 tablet 6  . pregabalin (LYRICA) 75 MG capsule Take 1 capsule (75 mg total) by mouth 2 (two) times daily. 60 capsule 6  . sildenafil (REVATIO) 20 MG tablet Take 1 tablet (20 mg total) by mouth 3 (three) times daily. 90 tablet 6  .  spironolactone (ALDACTONE) 25 MG tablet Take 0.5 tablets (12.5 mg total) by mouth daily. 90 tablet 3  . warfarin (COUMADIN) 2 MG tablet Take 2 mg daily starting 01/09/15 and adjust as needed per LAVD clinic 45 tablet 6  . colchicine 0.6 MG tablet Take 1 tablet (0.6 mg total) by mouth 2 (two) times daily as needed (as needed for gout pain). (Patient not taking: Reported on 02/13/2015) 30 tablet 6  . furosemide (LASIX) 40 MG tablet Take 1 tablet (40 mg total) by mouth as needed. (Patient not taking: Reported on 01/28/2015) 60 tablet 9   No current facility-administered medications for this encounter.    Review of patient's allergies indicates no known allergies.  REVIEW OF SYSTEMS: All systems negative except as listed in HPI, PMH and Problem list.   VAD interrogation & Equipment Management: Per LVAD nurse note above.  Filed Vitals:   03/06/15 1139 03/06/15 1143  BP: 115/67 104/0  Pulse: 82   Height: 5\' 7"  (1.702 m)   Weight: 165 lb (74.844 kg)   SpO2: 100%    Physical Exam: GENERAL: Well appearing, male who presents to clinic today in no acute distress. HEENT: normal  NECK: Supple, JVP 5 .  2+ bilaterally, no bruits.  No lymphadenopathy or thyromegaly appreciated.   CARDIAC:  Mechanical heart sounds with LVAD hum present.  LUNGS:  Clear to auscultation bilaterally.  ABDOMEN:  Soft, round, nontender, positive bowel sounds x4.     LVAD exit site:   Dressing dry and intact.  No erythema or drainage.  Stabilization device present and accurately applied.  Driveline dressing is being changed daily per sterile technique. EXTREMITIES:  Warm and dry, no cyanosis, clubbing, rash or edema  NEUROLOGIC:  Alert and oriented x 4.  Gait steady.  No aphasia.  No dysarthria.  Affect pleasant.      ASSESSMENT AND PLAN:   1. Chronic systolic HF  Status post HMII LVAD implantation 12/20/14:  Doing very well. Currently has NYHA class II symptoms on LVAD support. Volume status looks good off lasix. Less PI events recently. I will have him start cardiac rehab.  2. RV failure: Now off milrinone.  Continue sildenafil 3. HTN: MAP looks good at 89 today. Continue current regimen. 4. Gout: Well controlled. Continue allopurinol. Continue colchicine prn. 5. Chronic AF: Rate controlled. On coumadin for VAD.  6. Situational depression: Improving. Continue Celexa. Enrolled in Support Group.  7. Anticoagulation management:  INR goal 2.0-3.0.  Will evaluate INR value today and follow up with patient for necessary changes.  He is on ASA 81 (lower dose due to epistaxis).  8. VAD: Parameters reviewed personally. Driveline site looks good.  RTC in 4 weeks   Dalton McLean,MD 03/06/2015

## 2015-03-06 NOTE — Patient Instructions (Signed)
1.  No change in meds. 2.  Will call you with INR results and coumadin dosing. 3.  Return to [facility] in one month. 4.  Will place referral for cardiac rehab.

## 2015-03-18 ENCOUNTER — Encounter: Payer: Self-pay | Admitting: *Deleted

## 2015-03-20 ENCOUNTER — Other Ambulatory Visit (INDEPENDENT_AMBULATORY_CARE_PROVIDER_SITE_OTHER): Payer: 59 | Admitting: *Deleted

## 2015-03-20 ENCOUNTER — Ambulatory Visit (HOSPITAL_COMMUNITY): Payer: Self-pay | Admitting: *Deleted

## 2015-03-20 DIAGNOSIS — I428 Other cardiomyopathies: Secondary | ICD-10-CM

## 2015-03-20 DIAGNOSIS — I5022 Chronic systolic (congestive) heart failure: Secondary | ICD-10-CM

## 2015-03-20 DIAGNOSIS — I10 Essential (primary) hypertension: Secondary | ICD-10-CM | POA: Diagnosis not present

## 2015-03-20 DIAGNOSIS — I9589 Other hypotension: Secondary | ICD-10-CM

## 2015-03-20 DIAGNOSIS — I482 Chronic atrial fibrillation, unspecified: Secondary | ICD-10-CM

## 2015-03-20 DIAGNOSIS — I472 Ventricular tachycardia, unspecified: Secondary | ICD-10-CM

## 2015-03-20 NOTE — Addendum Note (Signed)
Addended by: Eulis Foster on: 03/20/2015 02:18 PM   Modules accepted: Orders

## 2015-03-21 LAB — PROTIME-INR
INR: 2.21 — ABNORMAL HIGH (ref <1.50)
Prothrombin Time: 24.8 seconds — ABNORMAL HIGH (ref 11.6–15.2)

## 2015-04-02 ENCOUNTER — Other Ambulatory Visit (HOSPITAL_COMMUNITY): Payer: Self-pay | Admitting: *Deleted

## 2015-04-02 DIAGNOSIS — Z95811 Presence of heart assist device: Secondary | ICD-10-CM

## 2015-04-02 DIAGNOSIS — Z7901 Long term (current) use of anticoagulants: Secondary | ICD-10-CM

## 2015-04-04 ENCOUNTER — Encounter (HOSPITAL_COMMUNITY): Payer: Self-pay

## 2015-04-04 ENCOUNTER — Encounter: Payer: Self-pay | Admitting: Licensed Clinical Social Worker

## 2015-04-04 ENCOUNTER — Ambulatory Visit (HOSPITAL_COMMUNITY): Payer: Self-pay | Admitting: Infectious Diseases

## 2015-04-04 ENCOUNTER — Ambulatory Visit (HOSPITAL_COMMUNITY)
Admission: RE | Admit: 2015-04-04 | Discharge: 2015-04-04 | Disposition: A | Discharge status: Home / Self Care | Payer: 59 | Source: Ambulatory Visit | Attending: Internal Medicine | Admitting: Internal Medicine

## 2015-04-04 VITALS — BP 106/71 | HR 88 | Wt 164.0 lb

## 2015-04-04 DIAGNOSIS — Z7982 Long term (current) use of aspirin: Secondary | ICD-10-CM | POA: Diagnosis not present

## 2015-04-04 DIAGNOSIS — Z79899 Other long term (current) drug therapy: Secondary | ICD-10-CM | POA: Insufficient documentation

## 2015-04-04 DIAGNOSIS — M069 Rheumatoid arthritis, unspecified: Secondary | ICD-10-CM | POA: Diagnosis not present

## 2015-04-04 DIAGNOSIS — Z7901 Long term (current) use of anticoagulants: Secondary | ICD-10-CM | POA: Insufficient documentation

## 2015-04-04 DIAGNOSIS — I482 Chronic atrial fibrillation: Secondary | ICD-10-CM | POA: Diagnosis not present

## 2015-04-04 DIAGNOSIS — I428 Other cardiomyopathies: Secondary | ICD-10-CM | POA: Insufficient documentation

## 2015-04-04 DIAGNOSIS — R791 Abnormal coagulation profile: Secondary | ICD-10-CM

## 2015-04-04 DIAGNOSIS — F4321 Adjustment disorder with depressed mood: Secondary | ICD-10-CM | POA: Diagnosis not present

## 2015-04-04 DIAGNOSIS — I110 Hypertensive heart disease with heart failure: Secondary | ICD-10-CM | POA: Diagnosis not present

## 2015-04-04 DIAGNOSIS — Z95811 Presence of heart assist device: Secondary | ICD-10-CM | POA: Diagnosis not present

## 2015-04-04 DIAGNOSIS — M109 Gout, unspecified: Secondary | ICD-10-CM | POA: Insufficient documentation

## 2015-04-04 DIAGNOSIS — I5022 Chronic systolic (congestive) heart failure: Secondary | ICD-10-CM | POA: Diagnosis not present

## 2015-04-04 DIAGNOSIS — Z5181 Encounter for therapeutic drug level monitoring: Secondary | ICD-10-CM

## 2015-04-04 LAB — BASIC METABOLIC PANEL
Anion gap: 7 (ref 5–15)
BUN: 15 mg/dL (ref 6–20)
CALCIUM: 9.2 mg/dL (ref 8.9–10.3)
CO2: 25 mmol/L (ref 22–32)
CREATININE: 1.06 mg/dL (ref 0.61–1.24)
Chloride: 105 mmol/L (ref 101–111)
GFR calc non Af Amer: >60 mL/min (ref >60)
Glucose, Bld: 100 mg/dL — ABNORMAL HIGH (ref 65–99)
Potassium: 3.6 mmol/L (ref 3.5–5.1)
SODIUM: 137 mmol/L (ref 135–145)

## 2015-04-04 LAB — PROTIME-INR
INR: 1.36 (ref 0.00–1.49)
PROTHROMBIN TIME: 16.9 s — AB (ref 11.6–15.2)

## 2015-04-04 LAB — CBC
HCT: 35.4 % — ABNORMAL LOW (ref 39.0–52.0)
Hemoglobin: 10.9 g/dL — ABNORMAL LOW (ref 13.0–17.0)
MCH: 20.3 pg — ABNORMAL LOW (ref 26.0–34.0)
MCHC: 30.8 g/dL (ref 30.0–36.0)
MCV: 66 fL — ABNORMAL LOW (ref 78.0–100.0)
PLATELETS: 210 10*3/uL (ref 150–400)
RBC: 5.36 MIL/uL (ref 4.22–5.81)
RDW: 23 % — ABNORMAL HIGH (ref 11.5–15.5)
WBC: 6.9 10*3/uL (ref 4.0–10.5)

## 2015-04-04 LAB — LACTATE DEHYDROGENASE: LDH: 282 U/L — AB (ref 98–192)

## 2015-04-04 MED ORDER — ENOXAPARIN SODIUM 40 MG/0.4ML ~~LOC~~ SOLN: 40.0000 mg | Freq: Two times a day (BID) | SUBCUTANEOUS | Status: DC

## 2015-04-04 NOTE — Progress Notes (Signed)
Symptom Yes No Details  Angina  x Activity:  Claudication  x How far:  Syncope  x When:  Stroke  x   Orthopnea  x How many pillows: 2 for comfort  PND  x How often:  CPAP  N/A How many hrs:  Pedal edema  x   Abd fullness  x   N&V  x Good appetite; eating full meals except for Monday  Diaphoresis  x When:  Bleeding  x No BRBPR or blood in stool  Urine  x clear yellow urine   SOB  x Activity:  Palpitations  x When:  ICD shock  x   Hospitlizaitons  x When/where/why:   ED visit  x When/where/why:  Other MD  x When/who/why:  Activity   Outside walking; cooking; light cleaning; exercises recommended by home PT (home PT discharged 2 weeks ago)  Fluid   <2L  Diet   No added salt   Vital Signs: HR: 84 Doppler BP: 110 Automatic BP: 106/71 (89) SPO2: 100%  Weight: 165  lbs Last wt:  163 bs  Home wts: 156 - 158 lbs   VAD interrogation & Equipment Management: Speed: 9200 Flow: 5.2 Power: 5.5w PI:  7.1 Alarms:  none Events: PI events:  0 - 10 daily; 20 PI events on 11/5 and 11/6 Primary Controller:  Replace back up battery in 30 months. Back up controller:   Replace back up battery in  30 months.  I reviewed the LVAD parameters from today and compared the results to the patient's prior recorded data.  LVAD interrogation was NEGATIVE for significant power changes, NEGATIVE for clinical alarms and Negative for significant PI events/speed drops. PI events down from last clinic visit.  No programming changes were made and pump is functioning within specified parameters.   LVAD equipment check completed and is in good working order.  Back-up equipment present. Pt/caregiver deny any alarms or VAD equipment issues.  Encounter Details:  Reports to clinic for one month follow up today. Reports that he is feeling great. Walking 1 - 3 blocks a day with variable terrain and tolerates well without SOB. Ready to report back to work on January 9th for modified schedule and requesting letter  from Dr. Haroldine Laws. Would like to do 4 hours/day, 5 d/week for 2 weeks then back to full time work.   Patient completed 1160 feet during 6 minute walk test. Tolerated well.   Neurocognitive trail making was not completed at this visit.   [place] Cardiomyopathy, Post-VAD QOL surveys ompleted by himself.   Exit Site Care: Drive line is being maintained by sisters/neighbor. Denies any fevers, chills, drainage or foul odor. VAD dressing removed and site care performed using sterile technique. Drive line exit site cleaned with Chlora prep applicators x 2, allowed to dry, and Sorbaview dressing with biopatch on exit site applied. Exit site with complete tissue ingrowth, the velour is fully implanted at exit site. No redness, tenderness, drainage, or foul odor noted. New anchor device attached. Driveline dressing is being changed weekly per sterile technique.   VAD coordinator reviewed daily log from home for daily temperature, weight, and VAD parameters. Pt is performing daily controller and system monitor self tests along with completing weekly and monthly maintenance for LVAD equipment.   LDH 282 --> stable within established baseline (240 - 270).  INR 1.36 --> started on half dose lovenox per protocol (40 mg/kg) BID. See anticoag note for details.   MAP 89 today on current  regimen.   Medication changes: none  RTC in 6 weeks.   [name], RN VAD Coordinator   Office: [phone] 24/7 VAD Pager: [phone]

## 2015-04-04 NOTE — Patient Instructions (Addendum)
1. INR is low today at 1.36--> come back Monday morning after 9am to have INR re-checked.   2. Support group Monday December 19th at 5 pm.   3. Will send you a work note in the mail for return to work on 04/28/14 with modified schedule 4hours a day, 5 days a week for 2 weeks.   4. Today take 4 pills (8 mg) of coumadin. Friday through Monday take 2 pills (4 mg).   Starting Next Tuesday--> take 2 pills every day except on Tuesday and Thursdays take only 1 pill   5. Start lovenox 40 mg (one whole syringe)--> today you will do one shot. Starting Friday take two shots every day ~12 hours apart until Monday morning's dose. Wait for INR on Monday before that evening dose.   6. Return to [facility] in 6 weeks.

## 2015-04-08 ENCOUNTER — Ambulatory Visit (HOSPITAL_COMMUNITY)
Admission: RE | Admit: 2015-04-08 | Discharge: 2015-04-08 | Disposition: A | Discharge status: Home / Self Care | Payer: 59 | Source: Ambulatory Visit | Attending: Internal Medicine | Admitting: Internal Medicine

## 2015-04-08 ENCOUNTER — Ambulatory Visit (HOSPITAL_COMMUNITY): Payer: Self-pay | Admitting: *Deleted

## 2015-04-08 DIAGNOSIS — Z7901 Long term (current) use of anticoagulants: Secondary | ICD-10-CM | POA: Diagnosis not present

## 2015-04-08 DIAGNOSIS — Z5181 Encounter for therapeutic drug level monitoring: Secondary | ICD-10-CM | POA: Insufficient documentation

## 2015-04-08 DIAGNOSIS — Z95811 Presence of heart assist device: Secondary | ICD-10-CM

## 2015-04-08 DIAGNOSIS — I5022 Chronic systolic (congestive) heart failure: Secondary | ICD-10-CM

## 2015-04-08 LAB — PROTIME-INR
INR: 1.62 — AB (ref 0.00–1.49)
PROTHROMBIN TIME: 19.3 s — AB (ref 11.6–15.2)

## 2015-04-11 ENCOUNTER — Ambulatory Visit (HOSPITAL_COMMUNITY)
Admission: RE | Admit: 2015-04-11 | Discharge: 2015-04-11 | Disposition: A | Discharge status: Home / Self Care | Payer: 59 | Source: Ambulatory Visit | Attending: Internal Medicine | Admitting: Internal Medicine

## 2015-04-11 ENCOUNTER — Ambulatory Visit (HOSPITAL_COMMUNITY): Payer: Self-pay | Admitting: Infectious Diseases

## 2015-04-11 ENCOUNTER — Encounter (HOSPITAL_COMMUNITY): Payer: Self-pay | Admitting: Infectious Diseases

## 2015-04-11 DIAGNOSIS — N179 Acute kidney failure, unspecified: Secondary | ICD-10-CM | POA: Insufficient documentation

## 2015-04-11 DIAGNOSIS — N189 Chronic kidney disease, unspecified: Secondary | ICD-10-CM | POA: Diagnosis not present

## 2015-04-11 DIAGNOSIS — Z5181 Encounter for therapeutic drug level monitoring: Secondary | ICD-10-CM

## 2015-04-11 DIAGNOSIS — R791 Abnormal coagulation profile: Secondary | ICD-10-CM

## 2015-04-11 DIAGNOSIS — Z7901 Long term (current) use of anticoagulants: Secondary | ICD-10-CM

## 2015-04-11 LAB — PROTIME-INR
INR: 1.82 — AB (ref 0.00–1.49)
Prothrombin Time: 21 seconds — ABNORMAL HIGH (ref 11.6–15.2)

## 2015-04-11 LAB — LACTATE DEHYDROGENASE: LDH: 323 U/L — ABNORMAL HIGH (ref 98–192)

## 2015-04-13 DIAGNOSIS — F4321 Adjustment disorder with depressed mood: Secondary | ICD-10-CM | POA: Insufficient documentation

## 2015-04-13 NOTE — Progress Notes (Signed)
Patient ID: [name], male   DOB: [birth year], 44 y.o.   MRN: [id number]   LVAD CLINIC NOTE  CHF: [name]  HPI:  [name] is a 44 y/o male with systolic HF due to NICM (EF 20-25%) s/p [employer] ICD, chronic AF and gout who underwent HM II VAD implantation on 12/20/14. 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  Follow up for Heart Failure/LVAD:  Returns for follow-up. Feels great. Walking 1-3 blocks without any problem. Depression resolved.  No edema, orthopnea or PND. No bleeding or neuro sx.. No problems with pump or driveline. Taking all meds as prescribed. Taking lasix only prn.  Denies LVAD alarms.  Denies driveline trauma, erythema or drainage.  Denies ICD shocks.   Reports taking Coumadin as prescribed and adherence to anticoagulation based dietary restrictions.  Denies bright red blood per rectum or melena, no dark urine or hematuria.     Past Medical History  Diagnosis Date  . Chronic systolic heart failure (HCC)     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%)  . Atrial fibrillation -parosysmal      Rx w amiodarone  . Noncompliance     H/O  MEDICAL NONCOMPLIANCE  . 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 Tricuspid valve regurgitation     SEVERE  . Severe mitral regurgitation   . Polymorphic ventricular tachycardia (HCC)     RECURRENT WITH APPROPRIATE SHOCK THERAPY IN THE PAST  . Ventricular fibrillation (HCC)     WITH APPROPRIATE SHOCK THERAPY IN THE PAST  . Hypertension   . Gout   . RA (rheumatoid arthritis) ([place])   . Automatic implantable cardiac defibrillator -BSX     single chamber  . GI bleed -massive     11 Units 2012  . Elevated LFTs   . H/O hyperthyroidism   . CHF (congestive heart failure) ([place])   . AKI (acute kidney injury) ([place])   . AICD (automatic cardioverter/defibrillator) present     Current Outpatient Prescriptions  Medication Sig Dispense Refill  .  allopurinol (ZYLOPRIM) 300 MG tablet Take 1 tablet (300 mg total) by mouth daily. 30 tablet 6  . aspirin EC 81 MG tablet Take 81 mg by mouth daily.    . citalopram (CELEXA) 20 MG tablet Take 1 tablet (20 mg total) by mouth daily. 30 tablet 6  . colchicine 0.6 MG tablet Take 1 tablet (0.6 mg total) by mouth 2 (two) times daily as needed (as needed for gout pain). 30 tablet 6  . digoxin (LANOXIN) 0.125 MG tablet Take 1 tablet (0.125 mg total) by mouth daily. 30 tablet 6  . ferrous sulfate 325 (65 FE) MG tablet Take 1 tablet (325 mg total) by mouth 2 (two) times daily with a meal. 60 tablet 3  . furosemide (LASIX) 40 MG tablet Take 1 tablet (40 mg total) by mouth as needed. 60 tablet 9  . hydrALAZINE (APRESOLINE) 50 MG tablet Take 1.5 tablets (75 mg total) by mouth every 8 (eight) hours. 180 tablet 6  . lisinopril (PRINIVIL,ZESTRIL) 10 MG tablet Take 1 tablet (10 mg total) by mouth daily. 30 tablet 6  . pantoprazole (PROTONIX) 40 MG tablet Take 1 tablet (40 mg total) by mouth daily. 30 tablet 6  . pregabalin (LYRICA) 75 MG capsule Take 1 capsule (75 mg total) by mouth 2 (two) times daily. 60 capsule 6  . sildenafil (REVATIO) 20 MG tablet Take 1 tablet (20 mg  total) by mouth 3 (three) times daily. 90 tablet 6  . spironolactone (ALDACTONE) 25 MG tablet Take 0.5 tablets (12.5 mg total) by mouth daily. 90 tablet 3  . warfarin (COUMADIN) 2 MG tablet Take 2 mg daily starting 01/09/15 and adjust as needed per LAVD clinic 45 tablet 6  . amiodarone (PACERONE) 200 MG tablet Take 1 tablet (200 mg total) by mouth 2 (two) times daily. (Patient taking differently: Take 200 mg by mouth daily. ) 60 tablet 6  . enoxaparin (LOVENOX) 40 MG/0.4ML injection Inject 0.4 mLs (40 mg total) into the skin every 12 (twelve) hours. 10 Syringe 0   No current facility-administered medications for this encounter.    Review of patient's allergies indicates no known allergies.  REVIEW OF SYSTEMS: All systems negative except as  listed in HPI, PMH and Problem list.   VAD interrogation & Equipment Management:  Speed: 9200  Flow: 5.2  Power: 5.5w  PI: 7.1  Alarms: none  Events: PI events: 0 - 10 daily; 20 PI events on 11/5 and 11/6  Primary Controller: Replace back up battery in 30 months.  Back up controller: Replace back up battery in 30 months   I reviewed the LVAD parameters from today, and compared the results to the patient's prior recorded data.  No programming changes were made.  The LVAD is functioning within specified parameters.  The patient performs LVAD self-test daily.  LVAD interrogation was negative for any significant power changes, alarms or PI events/speed drops.  LVAD equipment check completed and is in good working order.  Back-up equipment present.   LVAD education done on emergency procedures and precautions and reviewed exit site care.    Filed Vitals:   04/04/15 1027 04/04/15 1028  BP: 110/0 106/71  Pulse: 88   Weight: 164 lb (74.39 kg)   SpO2: 97%    Vital Signs:  HR: 84  Doppler BP: 110  Automatic BP: 106/71 (89)  SPO2: 100%  Weight: 165 lbs  Last wt: 163 bs  Home wts: 156 - 158 lbs    Physical Exam: GENERAL: Well appearing, male who presents to clinic today in no acute distress. HEENT: normal  NECK: Supple, JVP 5 .  2+ bilaterally, no bruits.  No lymphadenopathy or thyromegaly appreciated.   CARDIAC:  Mechanical heart sounds with LVAD hum present.  LUNGS:  Clear to auscultation bilaterally.  ABDOMEN:  Soft, round, nontender, positive bowel sounds x4.     LVAD exit site:  Dressing dry and intact.  No erythema or drainage.  Stabilization device present and accurately applied.  Driveline dressing is being changed daily per sterile technique. EXTREMITIES:  Warm and dry, no cyanosis, clubbing, rash or edema  NEUROLOGIC:  Alert and oriented x 4.  Gait steady.  No aphasia.  No dysarthria.  Affect pleasant.      ASSESSMENT AND PLAN:   1. Chronic systolic HF  Status post HMII  LVAD implantation 12/20/14 - Doing very well. Currently has NYHA class I symptoms on LVAD support. Volume status looks good off lasix.  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  Continue sildenafil 3. HTN - MAP looks good. Continue current regimen. 4. Gout - Well controlled. Continue allopurinol. Continue colchicine prn. 5. Chronic AF - Rate controlled. On coumadin for VAD.  6. Situational depression -Much improved.. Continue Celexa. Enrolled in Support Group.  7. Anticoagulation management - INR goal 2.0-3.0.  Will evaluate INR value today and follow up with patient for necessary changes.   8.  VAD - parameters reviewed personally. Driveline site looks good  [name], Daniel,MD 8:58 PM

## 2015-04-16 ENCOUNTER — Encounter: Payer: Self-pay | Admitting: *Deleted

## 2015-04-18 ENCOUNTER — Ambulatory Visit (HOSPITAL_COMMUNITY): Payer: Self-pay | Admitting: *Deleted

## 2015-04-18 ENCOUNTER — Encounter (HOSPITAL_COMMUNITY)
Admission: RE | Admit: 2015-04-18 | Discharge: 2015-04-18 | Disposition: A | Discharge status: Home / Self Care | Payer: 59 | Source: Ambulatory Visit | Attending: Internal Medicine | Admitting: Internal Medicine

## 2015-04-18 ENCOUNTER — Ambulatory Visit (HOSPITAL_COMMUNITY)
Admission: RE | Admit: 2015-04-18 | Discharge: 2015-04-18 | Disposition: A | Discharge status: Home / Self Care | Payer: 59 | Source: Ambulatory Visit | Attending: Cardiology | Admitting: Cardiology

## 2015-04-18 DIAGNOSIS — Z95811 Presence of heart assist device: Secondary | ICD-10-CM

## 2015-04-18 DIAGNOSIS — I5022 Chronic systolic (congestive) heart failure: Secondary | ICD-10-CM | POA: Diagnosis not present

## 2015-04-18 DIAGNOSIS — Z7901 Long term (current) use of anticoagulants: Secondary | ICD-10-CM

## 2015-04-18 DIAGNOSIS — R791 Abnormal coagulation profile: Secondary | ICD-10-CM | POA: Diagnosis not present

## 2015-04-18 DIAGNOSIS — Z5181 Encounter for therapeutic drug level monitoring: Secondary | ICD-10-CM

## 2015-04-18 LAB — LACTATE DEHYDROGENASE: LDH: 373 U/L — AB (ref 98–192)

## 2015-04-18 LAB — PROTIME-INR
INR: 1.95 — ABNORMAL HIGH (ref 0.00–1.49)
PROTHROMBIN TIME: 22.1 s — AB (ref 11.6–15.2)

## 2015-04-18 NOTE — Progress Notes (Signed)
Cardiac Rehab Medication Review by a Pharmacist  Does the patient  feel that his/her medications are working for him/her?  yes  Has the patient been experiencing any side effects to the medications prescribed?  No.  Denies s/sx bleeding w/ coumadin  Does the patient measure his/her own blood pressure or blood glucose at home?  yes BP running 110-120/70s  Does the patient have any problems obtaining medications due to transportation or finances?   no  Understanding of regimen: excellent Understanding of indications: excellent Potential of compliance: excellent    Pharmacist comments: Pt had great understanding of his medication regimen. Pt did not demonstrate any adherence or cost issues with his regimen.  Pt denied s/sx bleeding with his anticoagulation or SE to his regimen.     [name] 04/18/2015 8:47 AM

## 2015-04-24 ENCOUNTER — Encounter (HOSPITAL_COMMUNITY)
Admission: RE | Admit: 2015-04-24 | Discharge: 2015-04-24 | Disposition: A | Discharge status: Home / Self Care | Payer: 59 | Source: Ambulatory Visit | Attending: Internal Medicine | Admitting: Internal Medicine

## 2015-04-24 ENCOUNTER — Encounter (HOSPITAL_COMMUNITY): Payer: Self-pay

## 2015-04-24 DIAGNOSIS — I509 Heart failure, unspecified: Secondary | ICD-10-CM | POA: Diagnosis present

## 2015-04-24 DIAGNOSIS — Z95811 Presence of heart assist device: Secondary | ICD-10-CM | POA: Diagnosis present

## 2015-04-24 NOTE — Progress Notes (Addendum)
Pt started cardiac rehab today.  Pt tolerated light exercise without difficulty. VSS with exception of resting systolic doppler  BP [id number].  Zada Girt, NP, LVAD coordinator notified.  Per Cloyde Reams OK for pt to exercise.  Confirm  Doppler BP with dynamap reading for MEAN finding.  Ideal resting  MEAN less than 90.  Stop exercise if  MEAN greater than  120.  telemetry-sinus rhythm, non specific STT wave changes, frequent PAC, asymptomatic.  Medication list reconciled.  Pt verbalized compliance with medications and denies barriers to compliance. Pt has LVAD bag with him today and verbalizes understanding of LVAD home care.  PSYCHOSOCIAL ASSESSMENT:  PHQ-0. Pt exhibits positive coping skills, hopeful outlook with supportive family. No psychosocial needs identified at this time, no psychosocial interventions necessary.    Pt enjoys traveling, watching sports,. Favorite teams include [place] [employer] and [employer] basketball.   Pt cardiac rehab  goal is  to increase strength and endurance.  Pt encouraged to participate in home exercise in addition to cardiac rehab activities  to increase ability to achieve these goals.    Pt oriented to exercise equipment and routine.  Understanding verbalized.

## 2015-04-24 NOTE — Progress Notes (Signed)
Pt completed six minute walk test today.  He walked 1457 ft without complaint.  His ending blood pressure was 125/63 with MAP of 79.  RPE 13, VO2 peak 15.86, METs 4.53 Alberteen Sam, MA, ACSM RCEP

## 2015-04-25 ENCOUNTER — Ambulatory Visit (HOSPITAL_COMMUNITY): Payer: Self-pay | Admitting: *Deleted

## 2015-04-25 ENCOUNTER — Ambulatory Visit (HOSPITAL_COMMUNITY)
Admission: RE | Admit: 2015-04-25 | Discharge: 2015-04-25 | Disposition: A | Discharge status: Home / Self Care | Payer: 59 | Source: Ambulatory Visit | Attending: Internal Medicine | Admitting: Internal Medicine

## 2015-04-25 DIAGNOSIS — Z95811 Presence of heart assist device: Secondary | ICD-10-CM

## 2015-04-25 DIAGNOSIS — I5022 Chronic systolic (congestive) heart failure: Secondary | ICD-10-CM

## 2015-04-25 DIAGNOSIS — Z5181 Encounter for therapeutic drug level monitoring: Secondary | ICD-10-CM | POA: Diagnosis not present

## 2015-04-25 DIAGNOSIS — Z7901 Long term (current) use of anticoagulants: Secondary | ICD-10-CM

## 2015-04-25 LAB — PROTIME-INR
INR: 2.26 — ABNORMAL HIGH (ref 0.00–1.49)
Prothrombin Time: 24.7 seconds — ABNORMAL HIGH (ref 11.6–15.2)

## 2015-04-25 LAB — LACTATE DEHYDROGENASE: LDH: 237 U/L — ABNORMAL HIGH (ref 98–192)

## 2015-04-26 ENCOUNTER — Encounter (HOSPITAL_COMMUNITY)
Admission: RE | Admit: 2015-04-26 | Discharge: 2015-04-26 | Disposition: A | Discharge status: Home / Self Care | Payer: 59 | Source: Ambulatory Visit | Attending: Internal Medicine | Admitting: Internal Medicine

## 2015-04-26 DIAGNOSIS — I509 Heart failure, unspecified: Secondary | ICD-10-CM | POA: Diagnosis not present

## 2015-04-29 ENCOUNTER — Telehealth (HOSPITAL_COMMUNITY): Payer: Self-pay | Admitting: Infectious Diseases

## 2015-04-29 ENCOUNTER — Encounter (HOSPITAL_COMMUNITY)
Admission: RE | Admit: 2015-04-29 | Discharge: 2015-04-29 | Disposition: A | Discharge status: Home / Self Care | Payer: 59 | Source: Ambulatory Visit | Attending: Internal Medicine | Admitting: Internal Medicine

## 2015-04-29 DIAGNOSIS — I509 Heart failure, unspecified: Secondary | ICD-10-CM | POA: Diagnosis not present

## 2015-04-29 NOTE — Telephone Encounter (Signed)
Paged from [facility] with CR--[name]'s pre-exercise MAP was 82 and with exercise now 62. She had him stop briefly to have a banana and drink some fluids while she updated [place]. He resumed work today for the first time in >4 months and has had less intake PO than normal. Not symptomatic at this time. Advised to continue exercising as long as he is not symptomatic and to drink several glasses of water when he gets home.   He is not on a diuretic at this point for routine maintenance; he likely will need to keep adequate fluid/salt intake to ensure he does not get dehydrated.

## 2015-04-29 NOTE — Progress Notes (Signed)
[name]'s blood pressure was noted at 90/47 with a map of 62. [name] was asymptomatic with exercise. Upon inquiry, [name] reported that he only ate a few french fries today and only drank water with his medication. [name] RN  VAD coordinator called and notified about today's MAP. Patient instructed to drink  Water throughout the day. I also encouraged [name] to make sure he eats a healthy meal before coming to exercise at cardiac rehab. [name] was given two and 1/2 glasses of water at cardiac rehab and a snack. Repeat blood pressures and MAP 88/51 with a MAP of 63 then 88/60 with a MAP of 67. Exit blood pressure 104/71 with a MAP of 82. [name] left without complaints or symptoms. Back up battery pack with the patient.

## 2015-04-30 NOTE — Progress Notes (Signed)
LVAD Reassessment Psychosocial Screening  Date/Time Initiated:  04/04/2015 LVAD Implant date:  12/20/2014 Annual: 30-month: XX  Name:  [name] Address:  [place] [address] Home Phone:  [phone] (home) Cell:   Telephone Information:  Mobile [phone]   Marital Status:  Single Faith:  AME Language:  English DOB:  1970/08/01   Medical & Follow-up Adherence to Medical regimen/INR checks:  compliant Medication adherence:  compliant Physician/Clinic Appointment Attendance:  compliant Do you smoke?  never Do you drink alcohol?  Not at all Have you ever used illegal drugs or misused medications?  no Any changes to your medical history since receiving the LVAD?  no Do you have needed supplies for care of LVAD?  yes Do you have a charge/co-pay for supplies/INR visits?  some Emergency Plan for electrical outage?  Go to a friends house Do you drive?  yes  Advance Directives: <no information> Patient reports he is working on [employer]. Goals of care were discussed during reassessment  Psychological Health Appearance:  Well groomed Mental Status:  Alert and oriented Eye Contact:  good Thought Content:  coherent Speech:  clear Mood:  stable Affect:  Positive and uplifting Insight:  good Judgement: sound Interaction Style:  Talkative and upbeat  Family/Social Information Who lives in your home? Name:   Relationship:  Phone: Lives alone Other family members/support persons in your life? Name:   Relationship:  Phone: Pam   Sister (lives in [place]) Caren Griffins  Sister  (lives in [place]) [place] is the primary caregiver? Name: Jeannene Patella and Caren Griffins  Who is the secondary caregiver? Name: Self-care: independent Ambulation: independent Limitations: none Any barriers impacting ability to participate in care? none Who does your driveline site dressing changes?  Self or Cedric (friend) How often are dressings changed?    weekly Community Are you active with community agencies/resources/homecare?no Are you active in a church, synagogue, mosque or other faith based community? [place] What other sources do you have for spiritual support? Church friends Are you active in any clubs or social organizations? Church groups What do you do for fun?  Hobbies?  Interests? Travel and visit back home in [place] What is your source of income? Temporary disability- returning to work 04/29/2015 Do you have difficulty meeting your monthly expenses? No Do you own or rent your home? Own  Health Insurance Primary Insurance: [employer] Secondary: none   Mental Health History How have you been feeling the past 3 months? Pretty good Do you have any concerns with body image with the LVAD implants? "positive because I know the alternative" How do you handle stressful situations? Walking and praying Are there any other stressors in your life? no Have you had any past or current thoughts of suicide?no How many hours do you sleep at night? 8 hrs How is your appetite? good   Hospital Anxiety and Depression Screen (HADS) score: 6 Do you need to see a counselor, psychiatrist or therapist?   no Do you attend or wish to attend the LVAD support group? Plans to attend  Patient stated explanation how their life has improved as a result of receiving the LVAD:  "an eye opening experience"- I don't feel fatigued and feel I look better. Patient's biggest concern or fear about living with the LVAD: "power going out" Patient states the following coping mechanisms for concerns and fears of LVAD:  "hopeful for no ice storms" hope and prayers Patient stated barriers to  living with the LVAD: "Unable to tuck in my shirt" Patient feels they had adequate support from the VAD team?  Yes, everything is fine    Caregiver questions           (Caregiver not available at the time of reassessment) How has your life been  affected since your spouse/partner received the LVAD?  Caregiver response:  Caregiver stated biggest concern or fear about caregiving with an LVAD patient:  Caregiver stated coping strategies for concerns and fears:  Caregiver stated barriers to caregiving:  Caregiver feels receives adequate support from the VAD team?  Caregiver stated strategies to improve support from the VAD team:    Clinical Intervention Needed: None needed at this time. CSW encouraged attendance at the [facility].  Clinical Impressions/Recommendations: Patient appears to be coping well with adjustment to life with a VAD. Patient has good support from his sisters and church family. Patient appears to have a positive outlook and strong faith which has helped him in his successful recovery from VAD surgery and continued life with a VAD. CSW will continue to follow for support and any other needs that may arise.   Louann Liv, [place]

## 2015-05-01 ENCOUNTER — Encounter (HOSPITAL_COMMUNITY)
Admission: RE | Admit: 2015-05-01 | Discharge: 2015-05-01 | Disposition: A | Discharge status: Home / Self Care | Payer: 59 | Source: Ambulatory Visit | Attending: Internal Medicine | Admitting: Internal Medicine

## 2015-05-01 DIAGNOSIS — I509 Heart failure, unspecified: Secondary | ICD-10-CM | POA: Diagnosis not present

## 2015-05-03 ENCOUNTER — Encounter (HOSPITAL_COMMUNITY)
Admission: RE | Admit: 2015-05-03 | Discharge: 2015-05-03 | Disposition: A | Discharge status: Home / Self Care | Payer: 59 | Source: Ambulatory Visit | Attending: Internal Medicine | Admitting: Internal Medicine

## 2015-05-03 DIAGNOSIS — I509 Heart failure, unspecified: Secondary | ICD-10-CM | POA: Diagnosis not present

## 2015-05-06 ENCOUNTER — Encounter (HOSPITAL_COMMUNITY): Payer: 59

## 2015-05-07 ENCOUNTER — Ambulatory Visit: Payer: Self-pay | Admitting: Infectious Diseases

## 2015-05-07 ENCOUNTER — Ambulatory Visit (INDEPENDENT_AMBULATORY_CARE_PROVIDER_SITE_OTHER): Payer: 59 | Admitting: *Deleted

## 2015-05-07 ENCOUNTER — Encounter: Payer: Self-pay | Admitting: Internal Medicine

## 2015-05-07 ENCOUNTER — Ambulatory Visit (HOSPITAL_COMMUNITY)
Admission: RE | Admit: 2015-05-07 | Discharge: 2015-05-07 | Disposition: A | Discharge status: Home / Self Care | Payer: 59 | Source: Ambulatory Visit | Attending: Cardiology | Admitting: Cardiology

## 2015-05-07 DIAGNOSIS — I5022 Chronic systolic (congestive) heart failure: Secondary | ICD-10-CM

## 2015-05-07 DIAGNOSIS — Z7901 Long term (current) use of anticoagulants: Secondary | ICD-10-CM | POA: Insufficient documentation

## 2015-05-07 DIAGNOSIS — Z95810 Presence of automatic (implantable) cardiac defibrillator: Secondary | ICD-10-CM | POA: Diagnosis not present

## 2015-05-07 DIAGNOSIS — Z95811 Presence of heart assist device: Secondary | ICD-10-CM | POA: Diagnosis not present

## 2015-05-07 LAB — CUP PACEART INCLINIC DEVICE CHECK
HIGH POWER IMPEDANCE MEASURED VALUE: 43 Ohm
HIGH POWER IMPEDANCE MEASURED VALUE: 45 Ohm
Lead Channel Impedance Value: 677 Ohm
Lead Channel Pacing Threshold Amplitude: 1.2 V
Lead Channel Pacing Threshold Pulse Width: 0.4 ms
Lead Channel Sensing Intrinsic Amplitude: 13.8 mV
Lead Channel Setting Pacing Amplitude: 2.2 V
MDC IDC LEAD IMPLANT DT: 19990517
MDC IDC LEAD LOCATION: 753860
MDC IDC LEAD MODEL: 144
MDC IDC LEAD SERIAL: 303019
MDC IDC SESS DTM: 20170117050000
MDC IDC SET LEADCHNL RV PACING PULSEWIDTH: 0.4 ms
MDC IDC SET LEADCHNL RV SENSING SENSITIVITY: 0.5 mV
MDC IDC STAT BRADY RV PERCENT PACED: 1 % — AB
Pulse Gen Serial Number: 100391

## 2015-05-07 LAB — PROTIME-INR
INR: 3.22 — ABNORMAL HIGH (ref 0.00–1.49)
Prothrombin Time: 32.3 seconds — ABNORMAL HIGH (ref 11.6–15.2)

## 2015-05-07 NOTE — Progress Notes (Signed)
LVAD Exit Site Care:  VAD dressing removed and site care performed using sterile technique. Drive line exit site cleaned with Chlora prep applicators x 2, allowed to dry, and Sorbaview dressing with biopatch/gauze dressing with aquacel strip re-applied. Exit site well healed and incorporated. The velour is fully implanted at exit site. No redness, tenderness, drainage, or foul odor noted. Sorbaview dressing/Biopatch reapplied. Drive line anchor re-applied. Pt denies fever or chills. Driveline dressing is being changed weekly per sterile technique.  Pt/caregiver deny any alarms or VAD equipment issues. Back-up equipment present.   Neighbor whom has been performing his dressing changes has moved and now without assistance to perform dressing changes. Will be maintained in [facility] until he decides he is ready to perform his own dressing care.   [name], RN VAD Coordinator   Office: [phone] 24/7 VAD Pager: [phone]

## 2015-05-07 NOTE — Progress Notes (Signed)
ICD check in clinic. Normal device function. Thresholds and sensing consistent with previous device measurements. Impedance trends stable over time. 168 NSVT episodes since September 2015- longest available EGM- 7 beats. Histogram distribution appropriate for patient and level of activity. No changes made this session. Device programmed at appropriate safety margins- RV output increased to 2.2V @ 0.3ms from 2V. Device programmed to optimize intrinsic conduction. Estimated longevity 10 years. Pt enrolled in remote follow-up. ROV with SK 08/06/15.

## 2015-05-08 ENCOUNTER — Encounter (HOSPITAL_COMMUNITY): Payer: 59

## 2015-05-08 ENCOUNTER — Telehealth (HOSPITAL_COMMUNITY): Payer: Self-pay | Admitting: *Deleted

## 2015-05-10 ENCOUNTER — Encounter (HOSPITAL_COMMUNITY): Payer: 59

## 2015-05-13 ENCOUNTER — Encounter (HOSPITAL_COMMUNITY)
Admission: RE | Admit: 2015-05-13 | Discharge: 2015-05-13 | Disposition: A | Discharge status: Home / Self Care | Payer: 59 | Source: Ambulatory Visit | Attending: Internal Medicine | Admitting: Internal Medicine

## 2015-05-13 DIAGNOSIS — I509 Heart failure, unspecified: Secondary | ICD-10-CM | POA: Diagnosis not present

## 2015-05-13 NOTE — Progress Notes (Signed)
Pt switched to the 6:45 am class due to his work schedule.  Pt absent last week due to cold and congestion.  Pt tolerated light exercise with no complaints. Cherre Huger, BSN

## 2015-05-15 ENCOUNTER — Encounter (HOSPITAL_COMMUNITY): Payer: 59

## 2015-05-15 ENCOUNTER — Encounter (HOSPITAL_COMMUNITY)
Admission: RE | Admit: 2015-05-15 | Discharge: 2015-05-15 | Disposition: A | Discharge status: Home / Self Care | Payer: 59 | Source: Ambulatory Visit | Attending: Internal Medicine | Admitting: Internal Medicine

## 2015-05-15 DIAGNOSIS — I509 Heart failure, unspecified: Secondary | ICD-10-CM | POA: Diagnosis not present

## 2015-05-16 ENCOUNTER — Ambulatory Visit (HOSPITAL_COMMUNITY): Payer: Self-pay | Admitting: *Deleted

## 2015-05-16 ENCOUNTER — Other Ambulatory Visit (HOSPITAL_COMMUNITY): Payer: Self-pay | Admitting: *Deleted

## 2015-05-16 ENCOUNTER — Ambulatory Visit (HOSPITAL_COMMUNITY)
Admission: RE | Admit: 2015-05-16 | Discharge: 2015-05-16 | Disposition: A | Discharge status: Home / Self Care | Payer: 59 | Source: Ambulatory Visit | Attending: Cardiology | Admitting: Cardiology

## 2015-05-16 ENCOUNTER — Encounter (HOSPITAL_COMMUNITY): Payer: Self-pay | Admitting: *Deleted

## 2015-05-16 VITALS — BP 100/0 | HR 71 | Ht 67.0 in | Wt 173.2 lb

## 2015-05-16 DIAGNOSIS — I428 Other cardiomyopathies: Secondary | ICD-10-CM | POA: Insufficient documentation

## 2015-05-16 DIAGNOSIS — Z7901 Long term (current) use of anticoagulants: Secondary | ICD-10-CM | POA: Insufficient documentation

## 2015-05-16 DIAGNOSIS — F4321 Adjustment disorder with depressed mood: Secondary | ICD-10-CM | POA: Diagnosis not present

## 2015-05-16 DIAGNOSIS — Z95811 Presence of heart assist device: Secondary | ICD-10-CM | POA: Diagnosis not present

## 2015-05-16 DIAGNOSIS — I5022 Chronic systolic (congestive) heart failure: Secondary | ICD-10-CM

## 2015-05-16 DIAGNOSIS — I482 Chronic atrial fibrillation, unspecified: Secondary | ICD-10-CM

## 2015-05-16 DIAGNOSIS — I110 Hypertensive heart disease with heart failure: Secondary | ICD-10-CM | POA: Insufficient documentation

## 2015-05-16 DIAGNOSIS — M069 Rheumatoid arthritis, unspecified: Secondary | ICD-10-CM | POA: Insufficient documentation

## 2015-05-16 DIAGNOSIS — Z7982 Long term (current) use of aspirin: Secondary | ICD-10-CM | POA: Insufficient documentation

## 2015-05-16 DIAGNOSIS — Z79899 Other long term (current) drug therapy: Secondary | ICD-10-CM | POA: Insufficient documentation

## 2015-05-16 DIAGNOSIS — M109 Gout, unspecified: Secondary | ICD-10-CM | POA: Diagnosis not present

## 2015-05-16 LAB — CBC
HEMATOCRIT: 32.4 % — AB (ref 39.0–52.0)
Hemoglobin: 10.1 g/dL — ABNORMAL LOW (ref 13.0–17.0)
MCH: 21 pg — AB (ref 26.0–34.0)
MCHC: 31.2 g/dL (ref 30.0–36.0)
MCV: 67.4 fL — ABNORMAL LOW (ref 78.0–100.0)
Platelets: 193 10*3/uL (ref 150–400)
RBC: 4.81 MIL/uL (ref 4.22–5.81)
RDW: 22.5 % — AB (ref 11.5–15.5)
WBC: 4.2 10*3/uL (ref 4.0–10.5)

## 2015-05-16 LAB — PROTIME-INR
INR: 3.17 — AB (ref 0.00–1.49)
Prothrombin Time: 31.9 seconds — ABNORMAL HIGH (ref 11.6–15.2)

## 2015-05-16 LAB — BASIC METABOLIC PANEL
Anion gap: 4 — ABNORMAL LOW (ref 5–15)
BUN: 15 mg/dL (ref 6–20)
CO2: 25 mmol/L (ref 22–32)
Calcium: 8.6 mg/dL — ABNORMAL LOW (ref 8.9–10.3)
Chloride: 110 mmol/L (ref 101–111)
Creatinine, Ser: 1.18 mg/dL (ref 0.61–1.24)
GFR calc Af Amer: >60 mL/min (ref >60)
GFR calc non Af Amer: >60 mL/min (ref >60)
Glucose, Bld: 89 mg/dL (ref 65–99)
POTASSIUM: 4.1 mmol/L (ref 3.5–5.1)
SODIUM: 139 mmol/L (ref 135–145)

## 2015-05-16 LAB — LACTATE DEHYDROGENASE: LDH: 267 U/L — ABNORMAL HIGH (ref 98–192)

## 2015-05-16 MED ORDER — AMIODARONE HCL 200 MG PO TABS: 100.0000 mg | ORAL_TABLET | Freq: Every day | ORAL | Status: DC

## 2015-05-16 NOTE — Patient Instructions (Addendum)
1.  Will call you with lab results and coumadin dosing instructions. 2.  Decrease amiodarone to 100 mg daily (1/2 tab) 3.  Return to [facility] in two months.  4. Zinc gluconate 50 mg daily

## 2015-05-16 NOTE — Progress Notes (Addendum)
Symptom Yes No Details  Angina          x Activity:  Claudication          x How far:  Syncope          x When:  Stroke          x   Orthopnea          x How many pillows: 1 for comfort  PND          x How often:  CPAP  N/A How many hrs:  Pedal edema          x   Abd fullness          x   N&V          x Good appetite; eating full meals  Diaphoresis          x When:  Bleeding          x No BRBPR or blood in stool  Urine       clear yellow urine   SOB          x Activity:  Palpitations          x When:  ICD shock          x   Hospitlizaitons          x When/where/why:   ED visit          x When/where/why:  Other MD          x When/who/why:  Activity   Worked part time for two weeks; started full time this week.  Cooking; light cleaning. Cardiac rehab x 3 days week.   Fluid   No restrictions   Diet   No added salt   Vital Signs: HR: 71 Doppler BP: 100 Automatic BP:  107/64 (79) SPO2: 100% Weight: 173.2   lbs Last wt:  165  bs  Home wts: 162 - 165 bs   VAD interrogation & Equipment Management (Reviewed with Dr [name]): Speed: 9200 Flow: 5.1 Power: 5.1 PI:  8.1 Alarms:  Multiple low voltage advisories Events: PI events:  5 - 10 Primary Controller:  Replace back up battery in 28 months. Back up controller:   Replace back up battery in 28 months.  I reviewed the LVAD parameters from today and compared the results to the patient's prior recorded data.  LVAD interrogation was NEGATIVE for significant power changes, NEGATIVE for clinical alarms and Negative for significant PI events/speed drops. No programming changes were made and pump is functioning within specified parameters.   LVAD equipment check completed and is in good working order.  Back-up equipment present. Pt/caregiver deny any alarms or VAD equipment issues.  Encounter Details:  Reports to clinic for six week follow up today. Reports that he is feeling great. Returned to work part-time two weeks ago, resumed  full time work this week with no reported issues. 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 Pt is participating in cardiac rehab three days per week. Initially had reported low MAPs, but has increased fluid intake prior to visits and denies further issues.   Neurocognitive trail making completed.   Exit Site Care: Drive line is being maintained by VAD coordinator. Denies any fevers, chills, drainage or foul odor. VAD dressing removed and site care performed using sterile technique. Drive line exit site cleaned with Chlora prep applicators x 2, allowed to dry, and Sorbaview dressing with biopatch on exit  site applied. Exit site with complete tissue ingrowth, the velour is fully implanted at exit site. No redness, tenderness, drainage, or foul odor noted. New anchor device attached. Driveline dressing is being changed weekly per sterile technique.   Pt is performing daily controller and system monitor self tests along with completing weekly and monthly maintenance for LVAD equipment.   LDH  --> 267 stable within established baseline (240 - 270).  Patient Instructions: 1.  Will call you with lab results and coumadin dosing instructions. 2.  Decrease amiodarone to 100 mg daily (1/2 tab) 3.  Return to [name] clinic in two months.  4.  Zinc gluconate 50 mg daily    [name] Girt, RN VAD Coordinator  Office: [phone] 24/7 VAD Pager: [phone]       Patient ID: [name], male   DOB: 20-Jul-1970, 45 y.o.   MRN: [id number]   LVAD CLINIC NOTE  CHF: [name]  HPI:  [name] is a 45 y/o male with systolic HF due to NICM (EF 20-25%) s/p [employer] ICD, chronic AF and gout who underwent HM II VAD implantation on 12/20/14. 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  Follow up for Heart Failure/LVAD:  Returns for follow-up. Feels great. Walking 1-3 blocks without any problem. Depression resolved.  No edema, orthopnea or PND. No overt bleeding or neurological  symptoms. No problems with pump or driveline. Taking all meds as prescribed. Taking lasix only prn.  Denies LVAD alarms.  Denies driveline trauma, erythema or drainage.  Denies ICD shocks.   Reports taking Coumadin as prescribed and adherence to anticoagulation based dietary restrictions.  Denies bright red blood per rectum or melena, no dark urine or hematuria.     Past Medical History  Diagnosis Date  . Chronic systolic heart failure (HCC)     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%)  . Atrial fibrillation -parosysmal      Rx w amiodarone  . Noncompliance     H/O  MEDICAL NONCOMPLIANCE  . 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 Tricuspid valve regurgitation     SEVERE  . Severe mitral regurgitation   . Polymorphic ventricular tachycardia (HCC)     RECURRENT WITH APPROPRIATE SHOCK THERAPY IN THE PAST  . Ventricular fibrillation (HCC)     WITH APPROPRIATE SHOCK THERAPY IN THE PAST  . Hypertension   . Gout   . RA (rheumatoid arthritis) ([place])   . Automatic implantable cardiac defibrillator -BSX     single chamber  . GI bleed -massive     11 Units 2012  . Elevated LFTs   . H/O hyperthyroidism   . CHF (congestive heart failure) ([place])   . AKI (acute kidney injury) ([place])   . AICD (automatic cardioverter/defibrillator) present     Current Outpatient Prescriptions  Medication Sig Dispense Refill  . allopurinol (ZYLOPRIM) 300 MG tablet Take 1 tablet (300 mg total) by mouth daily. 30 tablet 6  . amiodarone (PACERONE) 200 MG tablet Take 0.5 tablets (100 mg total) by mouth daily. 30 tablet 6  . aspirin EC 81 MG tablet Take 81 mg by mouth daily.    . citalopram (CELEXA) 20 MG tablet Take 1 tablet (20 mg total) by mouth daily. 30 tablet 6  . digoxin (LANOXIN) 0.125 MG tablet Take 1 tablet (0.125 mg total) by mouth daily. 30 tablet 6  . ferrous sulfate 325 (65 FE) MG tablet Take 1 tablet (325 mg  total) by mouth 2 (two) times daily with a  meal. 60 tablet 3  . hydrALAZINE (APRESOLINE) 50 MG tablet Take 1.5 tablets (75 mg total) by mouth every 8 (eight) hours. 180 tablet 6  . lisinopril (PRINIVIL,ZESTRIL) 10 MG tablet Take 1 tablet (10 mg total) by mouth daily. 30 tablet 6  . pantoprazole (PROTONIX) 40 MG tablet Take 1 tablet (40 mg total) by mouth daily. 30 tablet 6  . pregabalin (LYRICA) 75 MG capsule Take 1 capsule (75 mg total) by mouth 2 (two) times daily. 60 capsule 6  . sildenafil (REVATIO) 20 MG tablet Take 1 tablet (20 mg total) by mouth 3 (three) times daily. 90 tablet 6  . spironolactone (ALDACTONE) 25 MG tablet Take 0.5 tablets (12.5 mg total) by mouth daily. 90 tablet 3  . warfarin (COUMADIN) 2 MG tablet Take 2 mg daily starting 01/09/15 and adjust as needed per LAVD clinic 45 tablet 6  . colchicine 0.6 MG tablet Take 1 tablet (0.6 mg total) by mouth 2 (two) times daily as needed (as needed for gout pain). (Patient not taking: Reported on 05/16/2015) 30 tablet 6  . furosemide (LASIX) 40 MG tablet Take 1 tablet (40 mg total) by mouth as needed. (Patient not taking: Reported on 05/16/2015) 60 tablet 9   No current facility-administered medications for this encounter.    Review of patient's allergies indicates no known allergies.  REVIEW OF SYSTEMS: All systems negative except as listed in HPI, PMH and Problem list.   VAD interrogation & Equipment Management:  See LVAD nurse's note.   Filed Vitals:   05/16/15 1124 05/16/15 1125  BP: 107/64 100/0  Pulse: 71   Height: 5\' 7"  (1.702 m)   Weight: 173 lb 3.2 oz (78.563 kg)   SpO2: 98%    Vital Signs:  BP 100/0 mmHg  Pulse 71  Ht 5\' 7"  (1.702 m)  Wt 173 lb 3.2 oz (78.563 kg)  BMI 27.12 kg/m2  SpO2 98% MAP 79  Physical Exam: GENERAL: Well appearing, male who presents to clinic today in no acute distress. HEENT: normal  NECK: Supple, JVP 5 .  2+ bilaterally, no bruits.  No lymphadenopathy or thyromegaly appreciated.   CARDIAC:  Mechanical heart sounds with LVAD  hum present.  LUNGS:  Clear to auscultation bilaterally.  ABDOMEN:  Soft, round, nontender, positive bowel sounds x4.     LVAD exit site:  Dressing dry and intact.  No erythema or drainage.  Stabilization device present and accurately applied.  Driveline dressing is being changed daily per sterile technique. EXTREMITIES:  Warm and dry, no cyanosis, clubbing, rash or edema  NEUROLOGIC:  Alert and oriented x 4.  Gait steady.  No aphasia.  No dysarthria.  Affect pleasant.      ASSESSMENT AND PLAN:   1. Chronic systolic HF  Status post HMII LVAD implantation 12/20/14.  Doing very well. Currently has NYHA class I symptoms on LVAD support. Volume status looks good off lasix.  2. RV failure:  Now off milrinone.  Continue sildenafil.  Continue digoxin, needs level drawn (can do with labs at next appt).  3. HTN: MAP looks good. Continue current regimen. 4. Gout: Well controlled. Continue allopurinol. Continue colchicine prn. 5. Chronic AF: Rate controlled. On coumadin for VAD. I think we can decrease amiodarone to 100 mg daily today, possibly discontinue in the future.  Could use low dose beta blocker instead for rate control.  Will need to check LFTs/TSH next appointment.  He will need regular  eye exams while on amiodarone.  6. Situational depression:  Much improved. Continue Celexa. 7. Anticoagulation management: INR goal 2.0-3.0 + ASA 81 daily.  Will evaluate INR value today and follow up with patient for necessary changes.   8. VAD: parameters reviewed personally. Driveline site looks good  Theador Hawthorne 05/16/2015

## 2015-05-17 ENCOUNTER — Encounter (HOSPITAL_COMMUNITY): Payer: 59

## 2015-05-20 ENCOUNTER — Encounter (HOSPITAL_COMMUNITY): Payer: 59

## 2015-05-20 ENCOUNTER — Encounter (HOSPITAL_COMMUNITY)
Admission: RE | Admit: 2015-05-20 | Discharge: 2015-05-20 | Disposition: A | Discharge status: Home / Self Care | Payer: 59 | Source: Ambulatory Visit | Attending: Internal Medicine | Admitting: Internal Medicine

## 2015-05-20 DIAGNOSIS — I509 Heart failure, unspecified: Secondary | ICD-10-CM | POA: Diagnosis not present

## 2015-05-20 NOTE — Progress Notes (Signed)
Pt in today for cardiac rehab.  Resting bp 138/84 map 110 auto cuff doppler bp 112.  Zada Girt vad coordinator notified by page.  Okay to proceed with exercise.  Resting MAP less than 110 and exertional MAP less than 115.  Pt able to proceed with exercise with no further issues with blood pressures.  Exertional MAP 74 -93 with post exercise MAP of 73.  Continue to monitor. Cherre Huger, BSN

## 2015-05-21 ENCOUNTER — Ambulatory Visit (HOSPITAL_COMMUNITY): Payer: Self-pay | Admitting: Infectious Diseases

## 2015-05-21 ENCOUNTER — Encounter (HOSPITAL_COMMUNITY): Payer: Self-pay | Admitting: Cardiology

## 2015-05-21 ENCOUNTER — Ambulatory Visit (HOSPITAL_COMMUNITY)
Admission: RE | Admit: 2015-05-21 | Discharge: 2015-05-21 | Disposition: A | Discharge status: Home / Self Care | Payer: 59 | Source: Ambulatory Visit | Attending: Adult Health | Admitting: Adult Health

## 2015-05-21 VITALS — BP 108/74 | HR 66

## 2015-05-21 DIAGNOSIS — F4321 Adjustment disorder with depressed mood: Secondary | ICD-10-CM | POA: Diagnosis not present

## 2015-05-21 DIAGNOSIS — R040 Epistaxis: Secondary | ICD-10-CM | POA: Diagnosis not present

## 2015-05-21 DIAGNOSIS — I110 Hypertensive heart disease with heart failure: Secondary | ICD-10-CM | POA: Diagnosis not present

## 2015-05-21 DIAGNOSIS — M069 Rheumatoid arthritis, unspecified: Secondary | ICD-10-CM | POA: Diagnosis not present

## 2015-05-21 DIAGNOSIS — Z7901 Long term (current) use of anticoagulants: Secondary | ICD-10-CM | POA: Insufficient documentation

## 2015-05-21 DIAGNOSIS — I5022 Chronic systolic (congestive) heart failure: Secondary | ICD-10-CM | POA: Insufficient documentation

## 2015-05-21 DIAGNOSIS — I482 Chronic atrial fibrillation, unspecified: Secondary | ICD-10-CM

## 2015-05-21 DIAGNOSIS — Z95811 Presence of heart assist device: Secondary | ICD-10-CM | POA: Diagnosis not present

## 2015-05-21 DIAGNOSIS — I428 Other cardiomyopathies: Secondary | ICD-10-CM | POA: Insufficient documentation

## 2015-05-21 DIAGNOSIS — Z8674 Personal history of sudden cardiac arrest: Secondary | ICD-10-CM | POA: Insufficient documentation

## 2015-05-21 DIAGNOSIS — Z79899 Other long term (current) drug therapy: Secondary | ICD-10-CM | POA: Diagnosis not present

## 2015-05-21 DIAGNOSIS — Z7982 Long term (current) use of aspirin: Secondary | ICD-10-CM | POA: Diagnosis not present

## 2015-05-21 DIAGNOSIS — M109 Gout, unspecified: Secondary | ICD-10-CM | POA: Diagnosis not present

## 2015-05-21 LAB — PROTIME-INR
INR: 2.72 — AB (ref 0.00–1.49)
Prothrombin Time: 28.4 seconds — ABNORMAL HIGH (ref 11.6–15.2)

## 2015-05-21 MED ORDER — HYDRALAZINE HCL 100 MG PO TABS: 100.0000 mg | ORAL_TABLET | Freq: Three times a day (TID) | ORAL | Status: DC

## 2015-05-21 MED ORDER — WARFARIN SODIUM 2 MG PO TABS: ORAL_TABLET | ORAL | Status: DC

## 2015-05-21 MED ORDER — OXYMETAZOLINE HCL 0.05 % NA SOLN
1.0000 | Freq: Once | NASAL | Status: DC
  Filled 2015-05-21: qty 15

## 2015-05-21 NOTE — Patient Instructions (Signed)
INCREASE Hydralazine to 100 mg ,one tab three times per day CHANGE Coumadin to 4 mg daily except 6 mg on Saturdays  Your physician recommends that you schedule a follow-up appointment in: AS SCHEDULED

## 2015-05-21 NOTE — Progress Notes (Signed)
Patient ID: [name], male   DOB: May 18, 1970, 45 y.o.   MRN: [id number]   LVAD CLINIC NOTE  CHF: [name]  HPI:  [name] is a 45 y/o male with systolic HF due to NICM (EF 20-25%) s/p [employer] ICD, chronic AF and gout who underwent HM II VAD implantation on 12/20/14. 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  Follow up for Heart Failure/LVAD: Acute visit for nose bleed that started this morning. INR at last visit 3. Ongoing ooze. He does not want to go to ED and would like to try conservative measures. OVerall feeling ok. Denies SOB/Orthopnea. No BRBPR.  Taking all medications.   Denies LVAD alarms.  Denies driveline trauma, erythema or drainage.  Denies ICD shocks.   Reports taking Coumadin as prescribed and adherence to anticoagulation based dietary restrictions.  Denies bright red blood per rectum or melena, no dark urine or hematuria.     Past Medical History  Diagnosis Date  . Chronic systolic heart failure (HCC)     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%)  . Atrial fibrillation -parosysmal      Rx w amiodarone  . Noncompliance     H/O  MEDICAL NONCOMPLIANCE  . 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 Tricuspid valve regurgitation     SEVERE  . Severe mitral regurgitation   . Polymorphic ventricular tachycardia (HCC)     RECURRENT WITH APPROPRIATE SHOCK THERAPY IN THE PAST  . Ventricular fibrillation (HCC)     WITH APPROPRIATE SHOCK THERAPY IN THE PAST  . Hypertension   . Gout   . RA (rheumatoid arthritis) ([place])   . Automatic implantable cardiac defibrillator -BSX     single chamber  . GI bleed -massive     11 Units 2012  . Elevated LFTs   . H/O hyperthyroidism   . CHF (congestive heart failure) ([place])   . AKI (acute kidney injury) ([place])   . AICD (automatic cardioverter/defibrillator) present     Current Outpatient Prescriptions  Medication Sig Dispense Refill  .  allopurinol (ZYLOPRIM) 300 MG tablet Take 1 tablet (300 mg total) by mouth daily. 30 tablet 6  . amiodarone (PACERONE) 200 MG tablet Take 0.5 tablets (100 mg total) by mouth daily. 30 tablet 6  . aspirin EC 81 MG tablet Take 81 mg by mouth daily.    . citalopram (CELEXA) 20 MG tablet Take 1 tablet (20 mg total) by mouth daily. 30 tablet 6  . digoxin (LANOXIN) 0.125 MG tablet Take 1 tablet (0.125 mg total) by mouth daily. 30 tablet 6  . ferrous sulfate 325 (65 FE) MG tablet Take 1 tablet (325 mg total) by mouth 2 (two) times daily with a meal. 60 tablet 3  . furosemide (LASIX) 40 MG tablet Take 1 tablet (40 mg total) by mouth as needed. 60 tablet 9  . hydrALAZINE (APRESOLINE) 50 MG tablet Take 1.5 tablets (75 mg total) by mouth every 8 (eight) hours. 180 tablet 6  . lisinopril (PRINIVIL,ZESTRIL) 10 MG tablet Take 1 tablet (10 mg total) by mouth daily. 30 tablet 6  . pantoprazole (PROTONIX) 40 MG tablet Take 1 tablet (40 mg total) by mouth daily. 30 tablet 6  . pregabalin (LYRICA) 75 MG capsule Take 1 capsule (75 mg total) by mouth 2 (two) times daily. 60 capsule 6  . sildenafil (REVATIO) 20 MG tablet Take 1 tablet (20 mg total) by  mouth 3 (three) times daily. 90 tablet 6  . spironolactone (ALDACTONE) 25 MG tablet Take 0.5 tablets (12.5 mg total) by mouth daily. 90 tablet 3  . warfarin (COUMADIN) 2 MG tablet Take 2 mg daily starting 01/09/15 and adjust as needed per LAVD clinic 45 tablet 6  . colchicine 0.6 MG tablet Take 1 tablet (0.6 mg total) by mouth 2 (two) times daily as needed (as needed for gout pain). (Patient not taking: Reported on 05/16/2015) 30 tablet 6   Current Facility-Administered Medications  Medication Dose Route Frequency Provider Last Rate Last Dose  . oxymetazoline (AFRIN) 0.05 % nasal spray 1 spray  1 spray Each Nare Once Amy D Clegg, NP        Review of patient's allergies indicates no known allergies.  REVIEW OF SYSTEMS: All systems negative except as listed in HPI, PMH  and Problem list.   VAD interrogation & Equipment Management:     Filed Vitals:   05/21/15 0942  BP: 108/74  Pulse: 66  SpO2: 100%   Vital Signs:  BP 108/74 mmHg  Pulse 66  SpO2 100% MAP 120  Vital Signs: HR: 66 Doppler BP: 120 Automatic BP: 108/76 (90) SPO2: 100% Weight: Last wt: 173.2  bs  Home wts: 162 - 165 bs   Physical Exam: GENERAL: Well appearing, male who presents to clinic today for nose bleed.  HEENT: normal except R nare bleeding.  NECK: Supple, JVP 5 .  2+ bilaterally, no bruits.  No lymphadenopathy or thyromegaly appreciated.   CARDIAC:  Mechanical heart sounds with LVAD hum present.  LUNGS:  Clear to auscultation bilaterally.  ABDOMEN:  Soft, round, nontender, positive bowel sounds x4.     LVAD exit site:  Dressing dry and intact.  No erythema or drainage.  Stabilization device present and accurately applied.  Driveline dressing is being changed daily per sterile technique. EXTREMITIES:  Warm and dry, no cyanosis, clubbing, rash or edema  NEUROLOGIC:  Alert and oriented x 4.  Gait steady.  No aphasia.  No dysarthria.  Affect pleasant.      ASSESSMENT AND PLAN:   1. Chronic systolic HF  Status post HMII LVAD implantation 12/20/14.  Doing very well. Currently has NYHA class I symptoms on LVAD support. Volume status looks good off lasix.  2. RV failure:  Now off milrinone.  Continue sildenafil.  Continue digoxin, needs level drawn (can do with labs at next appt).  3. HTN: MAP elevated. Increase hydralazine to 100 mg tid. Continue other meds.  4. Gout: Well controlled. Continue allopurinol. Continue colchicine prn. 5. Chronic AF: Rate controlled. On coumadin for VAD. Continue low-dose amiodarone 100 mg daily today, possibly discontinue in the future.  Could use low dose beta blocker instead for rate control.  Will need to check LFTs/TSH next appointment.  He will need regular eye exams while on amiodarone.  6. Situational depression:  Much improved. Continue  Celexa. 7. Anticoagulation management: INR goal 2.0-3.0 + ASA 81 daily.  INR 2.72. Will cut back Tuesday coumadin dose to 4 mg. Continue 4 mg daily except 6 mg on Saturday.    8. VAD: parameters reviewed personally. Driveline site looks good 9. Nose Bleed- INR check --> 2.72 Resolved with ICE pack and Afrin.  Follow up next week for dressing change and INR.   Amy Clegg NP-C  05/21/2015  Patient seen and examined with Darrick Grinder, NP. We discussed all aspects of the encounter. I agree with the assessment and plan as stated above.  He presents for an acute visit due to persistent epistaxis. Refused to go to ER for nasal packing (had it before and doesn't  Want it again if possible to avoid). Ice applied and also used Afrin. We were able to get control of bleeding in clinic.Gave him Afrin to take with him but told him to use sparingly given HTN. Hydralazine also increased to 100 tid. INR and hgb checked in clinic and are stable. Has chronic AF. Rate controlled on amio. Dose decreased last visit. Would try to stop at next visit. VAD parameters stable.   [name], Daniel,MD 11:21 PM

## 2015-05-22 ENCOUNTER — Encounter (HOSPITAL_COMMUNITY): Payer: 59

## 2015-05-22 ENCOUNTER — Other Ambulatory Visit (HOSPITAL_COMMUNITY): Payer: Self-pay | Admitting: *Deleted

## 2015-05-22 DIAGNOSIS — D62 Acute posthemorrhagic anemia: Secondary | ICD-10-CM

## 2015-05-22 MED ORDER — FERROUS SULFATE 325 (65 FE) MG PO TABS: 325.0000 mg | ORAL_TABLET | Freq: Two times a day (BID) | ORAL | Status: DC

## 2015-05-23 ENCOUNTER — Encounter (HOSPITAL_COMMUNITY): Payer: Self-pay

## 2015-05-23 ENCOUNTER — Ambulatory Visit (HOSPITAL_COMMUNITY)
Admission: RE | Admit: 2015-05-23 | Discharge: 2015-05-23 | Disposition: A | Discharge status: Home / Self Care | Payer: 59 | Source: Ambulatory Visit | Attending: Internal Medicine | Admitting: Internal Medicine

## 2015-05-23 VITALS — BP 109/82

## 2015-05-23 DIAGNOSIS — Z4801 Encounter for change or removal of surgical wound dressing: Secondary | ICD-10-CM | POA: Insufficient documentation

## 2015-05-23 DIAGNOSIS — Z5189 Encounter for other specified aftercare: Secondary | ICD-10-CM | POA: Diagnosis not present

## 2015-05-23 NOTE — Progress Notes (Signed)
LVAD Exit Site Care:  VAD dressing removed and site care performed using sterile technique. Drive line exit site cleaned with Chlora prep applicators x 2, allowed to dry, and Sorbaview dressing with biopatch/gauze dressing with aquacel strip re-applied. Exit site well healed and incorporated. The velour is fully implanted at exit site. No redness, tenderness, drainage, or foul odor noted. Sorbaview dressing/Biopatch reapplied. Drive line anchor re-applied. Pt denies fever or chills. Driveline dressing is being changed weekly per sterile technique.  Pt/caregiver deny any alarms or VAD equipment issues. Back-up equipment present.   Epistaxis resolved in R nare with sparing use of Afrin. BP improved with increased Hydralazine. MAP 90. Recheck next week.   [name], RN VAD Coordinator   Office: [phone] 24/7 VAD Pager: [phone]

## 2015-05-24 ENCOUNTER — Encounter (HOSPITAL_COMMUNITY): Payer: 59

## 2015-05-27 ENCOUNTER — Encounter (HOSPITAL_COMMUNITY): Payer: 59

## 2015-05-29 ENCOUNTER — Encounter (HOSPITAL_COMMUNITY): Payer: 59

## 2015-05-29 ENCOUNTER — Encounter (HOSPITAL_COMMUNITY)
Admission: RE | Admit: 2015-05-29 | Discharge: 2015-05-29 | Disposition: A | Discharge status: Home / Self Care | Payer: 59 | Source: Ambulatory Visit | Attending: Internal Medicine | Admitting: Internal Medicine

## 2015-05-29 ENCOUNTER — Other Ambulatory Visit (HOSPITAL_COMMUNITY): Payer: Self-pay | Admitting: *Deleted

## 2015-05-29 DIAGNOSIS — I509 Heart failure, unspecified: Secondary | ICD-10-CM | POA: Insufficient documentation

## 2015-05-29 DIAGNOSIS — Z95811 Presence of heart assist device: Secondary | ICD-10-CM

## 2015-05-29 DIAGNOSIS — Z7901 Long term (current) use of anticoagulants: Secondary | ICD-10-CM

## 2015-05-29 NOTE — Progress Notes (Signed)
[name] 45 y.o. male Nutrition Note Spoke with pt. Nutrition Plan and Nutrition Survey goals reviewed with pt. Pt is working toward following the Therapeutic Lifestyle Changes diet. Pt reports he is not eating out as frequently, has cut down on fried food, and is cooking with little or no salt added during cooking/at the table. Pt's elevated A1c noted. Pt states he checks CBG's once a week. Pt is not currently taking any diabetic medications. Pt is on Coumadin. Per discussion, pt is aware of the need to follow a diet with consistent vitamin K intake. Pt expressed understanding of the information reviewed. Pt aware of nutrition education classes offered and is unable to attend nutrition classes due to work schedule.  Lab Results  Component Value Date   HGBA1C 7.3* 12/18/2014   Wt Readings from Last 3 Encounters:  05/16/15 173 lb 3.2 oz (78.563 kg)  04/18/15 171 lb 15.3 oz (78 kg)  04/04/15 164 lb (74.39 kg)   Nutrition Diagnosis ? Food-and nutrition-related knowledge deficit related to lack of exposure to information as related to diagnosis of: ? CVD ? Elevated blood glucose  Nutrition RX/ Estimated Daily Nutrition Needs for: wt  maintenance  2300-2600 Kcal, 75-85 gm fat, 15-17 gm sat fat, 2.2-2.6 gm trans-fat, <1500 mg sodium  Nutrition Intervention ? Pt's individual nutrition plan reviewed with pt. ? Benefits of adopting Therapeutic Lifestyle Changes discussed when Medficts reviewed. ? Pt to attend the Portion Distortion class ? Pt to attend the   ? Nutrition I class                  ? Nutrition II class ? Pt given handouts for: ? Nutrition I class ? Nutrition II class  ? Continue client-centered nutrition education by RD, as part of interdisciplinary care. Goal(s) ? Pt to identify and limit food sources of saturated fat, trans fat, and sodium ? Pt able to name foods that affect blood glucose  Monitor and Evaluate progress toward nutrition goal with team. Nutrition  Risk: Change to Moderate Derek Mound, M.Ed, RD, LDN, CDE 05/29/2015 8:29 AM

## 2015-05-30 ENCOUNTER — Other Ambulatory Visit (HOSPITAL_COMMUNITY): Payer: Self-pay | Admitting: *Deleted

## 2015-05-30 ENCOUNTER — Ambulatory Visit (HOSPITAL_COMMUNITY)
Admission: RE | Admit: 2015-05-30 | Discharge: 2015-05-30 | Disposition: A | Discharge status: Home / Self Care | Payer: 59 | Source: Ambulatory Visit | Attending: Cardiology | Admitting: Cardiology

## 2015-05-30 DIAGNOSIS — Z7901 Long term (current) use of anticoagulants: Secondary | ICD-10-CM

## 2015-05-30 DIAGNOSIS — Z029 Encounter for administrative examinations, unspecified: Secondary | ICD-10-CM | POA: Insufficient documentation

## 2015-05-30 DIAGNOSIS — Z95811 Presence of heart assist device: Secondary | ICD-10-CM

## 2015-05-31 ENCOUNTER — Encounter (HOSPITAL_COMMUNITY): Payer: 59

## 2015-06-03 ENCOUNTER — Encounter (HOSPITAL_COMMUNITY): Payer: 59

## 2015-06-03 ENCOUNTER — Encounter (HOSPITAL_COMMUNITY)
Admission: RE | Admit: 2015-06-03 | Discharge: 2015-06-03 | Disposition: A | Discharge status: Home / Self Care | Payer: 59 | Source: Ambulatory Visit | Attending: Internal Medicine | Admitting: Internal Medicine

## 2015-06-03 DIAGNOSIS — I509 Heart failure, unspecified: Secondary | ICD-10-CM | POA: Diagnosis not present

## 2015-06-05 ENCOUNTER — Encounter (HOSPITAL_COMMUNITY): Payer: 59

## 2015-06-06 ENCOUNTER — Ambulatory Visit (HOSPITAL_COMMUNITY)
Admission: RE | Admit: 2015-06-06 | Discharge: 2015-06-06 | Disposition: A | Discharge status: Home / Self Care | Payer: 59 | Source: Ambulatory Visit | Attending: Internal Medicine | Admitting: Internal Medicine

## 2015-06-06 ENCOUNTER — Ambulatory Visit (HOSPITAL_COMMUNITY): Payer: Self-pay | Admitting: Infectious Diseases

## 2015-06-06 DIAGNOSIS — Z95811 Presence of heart assist device: Secondary | ICD-10-CM | POA: Diagnosis not present

## 2015-06-06 DIAGNOSIS — I5023 Acute on chronic systolic (congestive) heart failure: Secondary | ICD-10-CM | POA: Diagnosis not present

## 2015-06-06 DIAGNOSIS — Z7901 Long term (current) use of anticoagulants: Secondary | ICD-10-CM

## 2015-06-06 LAB — PROTIME-INR
INR: 3.16 — AB (ref 0.00–1.49)
Prothrombin Time: 31.8 seconds — ABNORMAL HIGH (ref 11.6–15.2)

## 2015-06-06 NOTE — Progress Notes (Signed)
LVAD Exit Site Care:  VAD dressing removed and site care performed using sterile technique. Drive line exit site cleaned with Chlora prep applicators x 2, allowed to dry, and Sorbaview dressing with biopatch re-applied. Exit site well healed and incorporated. The velour is fully implanted at exit site. No redness, tenderness, drainage, or foul odor noted. Drive line anchor re-applied. Pt denies fever or chills. Driveline dressing is being changed weekly per sterile technique.  Pt/caregiver deny any alarms or VAD equipment issues. Back-up equipment present.     Zada Girt, RN VAD Coordinator  Office: [phone] 24/7 VAD Pager: [phone]

## 2015-06-07 ENCOUNTER — Other Ambulatory Visit (HOSPITAL_COMMUNITY): Payer: Self-pay | Admitting: *Deleted

## 2015-06-07 ENCOUNTER — Encounter (HOSPITAL_COMMUNITY): Payer: 59

## 2015-06-07 ENCOUNTER — Encounter (HOSPITAL_COMMUNITY)
Admission: RE | Admit: 2015-06-07 | Discharge: 2015-06-07 | Disposition: A | Discharge status: Home / Self Care | Payer: 59 | Source: Ambulatory Visit | Attending: Internal Medicine | Admitting: Internal Medicine

## 2015-06-07 DIAGNOSIS — Z7901 Long term (current) use of anticoagulants: Secondary | ICD-10-CM

## 2015-06-07 DIAGNOSIS — I509 Heart failure, unspecified: Secondary | ICD-10-CM | POA: Diagnosis not present

## 2015-06-07 DIAGNOSIS — Z95811 Presence of heart assist device: Secondary | ICD-10-CM

## 2015-06-10 ENCOUNTER — Encounter (HOSPITAL_COMMUNITY)
Admission: RE | Admit: 2015-06-10 | Discharge: 2015-06-10 | Disposition: A | Discharge status: Home / Self Care | Payer: 59 | Source: Ambulatory Visit | Attending: Internal Medicine | Admitting: Internal Medicine

## 2015-06-10 ENCOUNTER — Encounter (HOSPITAL_COMMUNITY): Payer: 59

## 2015-06-10 DIAGNOSIS — I509 Heart failure, unspecified: Secondary | ICD-10-CM | POA: Diagnosis not present

## 2015-06-12 ENCOUNTER — Encounter (HOSPITAL_COMMUNITY): Payer: 59

## 2015-06-13 ENCOUNTER — Ambulatory Visit (HOSPITAL_COMMUNITY)
Admission: RE | Admit: 2015-06-13 | Discharge: 2015-06-13 | Disposition: A | Discharge status: Home / Self Care | Payer: 59 | Source: Ambulatory Visit | Attending: Internal Medicine | Admitting: Internal Medicine

## 2015-06-13 ENCOUNTER — Ambulatory Visit (HOSPITAL_COMMUNITY): Payer: Self-pay | Admitting: Infectious Diseases

## 2015-06-13 DIAGNOSIS — Z5181 Encounter for therapeutic drug level monitoring: Secondary | ICD-10-CM

## 2015-06-13 DIAGNOSIS — Z7901 Long term (current) use of anticoagulants: Secondary | ICD-10-CM | POA: Diagnosis not present

## 2015-06-13 DIAGNOSIS — Z95811 Presence of heart assist device: Secondary | ICD-10-CM | POA: Diagnosis not present

## 2015-06-13 DIAGNOSIS — Z79811 Long term (current) use of aromatase inhibitors: Secondary | ICD-10-CM | POA: Insufficient documentation

## 2015-06-13 LAB — PROTIME-INR
INR: 2.47 — AB (ref 0.00–1.49)
Prothrombin Time: 26.4 seconds — ABNORMAL HIGH (ref 11.6–15.2)

## 2015-06-14 ENCOUNTER — Encounter (HOSPITAL_COMMUNITY): Payer: 59

## 2015-06-14 ENCOUNTER — Encounter (HOSPITAL_COMMUNITY)
Admission: RE | Admit: 2015-06-14 | Discharge: 2015-06-14 | Disposition: A | Discharge status: Home / Self Care | Payer: 59 | Source: Ambulatory Visit | Attending: Internal Medicine | Admitting: Internal Medicine

## 2015-06-14 DIAGNOSIS — I509 Heart failure, unspecified: Secondary | ICD-10-CM | POA: Diagnosis not present

## 2015-06-17 ENCOUNTER — Encounter (HOSPITAL_COMMUNITY)
Admission: RE | Admit: 2015-06-17 | Discharge: 2015-06-17 | Disposition: A | Discharge status: Home / Self Care | Payer: 59 | Source: Ambulatory Visit | Attending: Internal Medicine | Admitting: Internal Medicine

## 2015-06-17 ENCOUNTER — Encounter (HOSPITAL_COMMUNITY): Payer: 59

## 2015-06-17 DIAGNOSIS — I509 Heart failure, unspecified: Secondary | ICD-10-CM | POA: Diagnosis not present

## 2015-06-19 ENCOUNTER — Encounter (HOSPITAL_COMMUNITY)
Admission: RE | Admit: 2015-06-19 | Discharge: 2015-06-19 | Disposition: A | Discharge status: Home / Self Care | Payer: 59 | Source: Ambulatory Visit | Attending: Internal Medicine | Admitting: Internal Medicine

## 2015-06-19 ENCOUNTER — Encounter (HOSPITAL_COMMUNITY): Payer: 59

## 2015-06-19 DIAGNOSIS — I509 Heart failure, unspecified: Secondary | ICD-10-CM | POA: Insufficient documentation

## 2015-06-19 DIAGNOSIS — Z95811 Presence of heart assist device: Secondary | ICD-10-CM | POA: Insufficient documentation

## 2015-06-20 ENCOUNTER — Ambulatory Visit (HOSPITAL_COMMUNITY): Payer: Self-pay | Admitting: *Deleted

## 2015-06-20 ENCOUNTER — Ambulatory Visit (HOSPITAL_COMMUNITY)
Admission: RE | Admit: 2015-06-20 | Discharge: 2015-06-20 | Disposition: A | Discharge status: Home / Self Care | Payer: 59 | Source: Ambulatory Visit | Attending: Cardiology | Admitting: Cardiology

## 2015-06-20 DIAGNOSIS — I5023 Acute on chronic systolic (congestive) heart failure: Secondary | ICD-10-CM | POA: Diagnosis not present

## 2015-06-20 DIAGNOSIS — Z95811 Presence of heart assist device: Secondary | ICD-10-CM

## 2015-06-20 DIAGNOSIS — Z7901 Long term (current) use of anticoagulants: Secondary | ICD-10-CM

## 2015-06-20 DIAGNOSIS — Z5181 Encounter for therapeutic drug level monitoring: Secondary | ICD-10-CM

## 2015-06-20 LAB — PROTIME-INR
INR: 1.95 — ABNORMAL HIGH (ref 0.00–1.49)
Prothrombin Time: 22.2 seconds — ABNORMAL HIGH (ref 11.6–15.2)

## 2015-06-20 NOTE — Addendum Note (Signed)
Encounter addended by: Adora Fridge, RPH on: 06/20/2015  2:36 PM<BR>     Documentation filed: SmartForms

## 2015-06-20 NOTE — Addendum Note (Signed)
Encounter addended by: Adora Fridge, RPH on: 06/20/2015  2:52 PM<BR>     Documentation filed: SmartForms, Episodes, Problem List

## 2015-06-20 NOTE — Progress Notes (Signed)
Patient ID: [name], male   DOB: [birth year], 45 y.o.   MRN: [id number] LVAD Exit Site:  VAD dressing removed and site care performed using sterile technique. Drive line exit site cleaned with Chlora prep applicators x 2, allowed to dry, and Sorbaview dressing with biopatch re-applied. Exit site well healed and incorporated. The velour is fully implanted at exit site. No redness, tenderness, drainage, or foul odor noted. Drive line anchor re-applied. Pt denies fever or chills. Driveline dressing is being changed weekly per sterile technique.    Carole Binning RN [facility]  HF Nurse Navigator

## 2015-06-21 ENCOUNTER — Encounter (HOSPITAL_COMMUNITY)
Admission: RE | Admit: 2015-06-21 | Discharge: 2015-06-21 | Disposition: A | Discharge status: Home / Self Care | Payer: 59 | Source: Ambulatory Visit | Attending: Internal Medicine | Admitting: Internal Medicine

## 2015-06-21 ENCOUNTER — Encounter (HOSPITAL_COMMUNITY): Payer: 59

## 2015-06-21 DIAGNOSIS — I509 Heart failure, unspecified: Secondary | ICD-10-CM | POA: Diagnosis not present

## 2015-06-24 ENCOUNTER — Encounter (HOSPITAL_COMMUNITY): Payer: 59

## 2015-06-24 ENCOUNTER — Encounter (HOSPITAL_COMMUNITY)
Admission: RE | Admit: 2015-06-24 | Discharge: 2015-06-24 | Disposition: A | Discharge status: Home / Self Care | Payer: 59 | Source: Ambulatory Visit | Attending: Internal Medicine | Admitting: Internal Medicine

## 2015-06-24 DIAGNOSIS — I509 Heart failure, unspecified: Secondary | ICD-10-CM | POA: Diagnosis not present

## 2015-06-24 NOTE — Progress Notes (Signed)
Pt in today for exercise at the 6:45 am Phase II program.  Pt pre exercise BP was elevated at 131/78 with map 98.  Pt reported to rehab staff that he had not eaten any breakfast nor drank any water.  Pt given graham crackers and peanut butter along with cup of water.  Pt placed in seated position with his feet elevated.  Pt ate little over half his snack.  Rechecked bp 127/80 with MAP of 96. Weight remained unchanged.  On call for VAD coordinator paged.  Received immediate response from VAD coordinator - Zada Girt.  While awaiting response from VAD coordinator. Pt ate a [employer] on Saturday, [employer] on Sunday afternoon and at a "dinner" Sunday night.  Information regarding bp and food intake relayed bp to [place].  Pt advised to watch his sodium intake and monitor his weight closely at home.  No exercise for today.  Pt to return on Wednesday. Cherre Huger, BSN

## 2015-06-26 ENCOUNTER — Encounter (HOSPITAL_COMMUNITY): Payer: 59

## 2015-06-26 ENCOUNTER — Encounter (HOSPITAL_COMMUNITY)
Admission: RE | Admit: 2015-06-26 | Discharge: 2015-06-26 | Disposition: A | Discharge status: Home / Self Care | Payer: 59 | Source: Ambulatory Visit | Attending: Internal Medicine | Admitting: Internal Medicine

## 2015-06-26 DIAGNOSIS — I509 Heart failure, unspecified: Secondary | ICD-10-CM | POA: Diagnosis not present

## 2015-06-27 ENCOUNTER — Ambulatory Visit (HOSPITAL_COMMUNITY)
Admission: RE | Admit: 2015-06-27 | Discharge: 2015-06-27 | Disposition: A | Discharge status: Home / Self Care | Payer: 59 | Source: Ambulatory Visit | Attending: Internal Medicine | Admitting: Internal Medicine

## 2015-06-27 ENCOUNTER — Ambulatory Visit: Payer: Self-pay | Admitting: *Deleted

## 2015-06-27 DIAGNOSIS — Z5181 Encounter for therapeutic drug level monitoring: Secondary | ICD-10-CM | POA: Insufficient documentation

## 2015-06-27 DIAGNOSIS — Z95811 Presence of heart assist device: Secondary | ICD-10-CM | POA: Insufficient documentation

## 2015-06-27 DIAGNOSIS — I5023 Acute on chronic systolic (congestive) heart failure: Secondary | ICD-10-CM

## 2015-06-27 DIAGNOSIS — Z7901 Long term (current) use of anticoagulants: Secondary | ICD-10-CM | POA: Diagnosis not present

## 2015-06-27 LAB — PROTIME-INR
INR: 2.25 — ABNORMAL HIGH (ref 0.00–1.49)
Prothrombin Time: 24.7 seconds — ABNORMAL HIGH (ref 11.6–15.2)

## 2015-06-27 NOTE — Progress Notes (Signed)
LVAD Exit Site Care:  VAD dressing removed and site care performed using sterile technique. Drive line exit site cleaned with Chlora prep applicators x 2, allowed to dry, and Sorbaview dressing with biopatch re-applied. Exit site well healed and incorporated. The velour is fully implanted at exit site. No redness, tenderness, drainage, or foul odor noted. Drive line anchor re-applied. Pt denies fever or chills. Driveline dressing is being changed weekly per sterile technique.  Pt/caregiver deny any alarms or VAD equipment issues. Back-up equipment present.     Zada Girt, RN VAD Coordinator  Office: [phone] 24/7 VAD Pager: [phone]

## 2015-06-28 ENCOUNTER — Encounter (HOSPITAL_COMMUNITY): Payer: 59

## 2015-07-01 ENCOUNTER — Encounter (HOSPITAL_COMMUNITY): Payer: 59

## 2015-07-03 ENCOUNTER — Encounter (HOSPITAL_COMMUNITY)
Admission: RE | Admit: 2015-07-03 | Discharge: 2015-07-03 | Disposition: A | Discharge status: Home / Self Care | Payer: 59 | Source: Ambulatory Visit | Attending: Internal Medicine | Admitting: Internal Medicine

## 2015-07-03 ENCOUNTER — Encounter (HOSPITAL_COMMUNITY): Payer: 59

## 2015-07-03 DIAGNOSIS — I509 Heart failure, unspecified: Secondary | ICD-10-CM | POA: Diagnosis not present

## 2015-07-04 ENCOUNTER — Ambulatory Visit (HOSPITAL_COMMUNITY)
Admission: RE | Admit: 2015-07-04 | Discharge: 2015-07-04 | Disposition: A | Discharge status: Home / Self Care | Payer: 59 | Source: Ambulatory Visit | Attending: Internal Medicine | Admitting: Internal Medicine

## 2015-07-04 ENCOUNTER — Encounter (HOSPITAL_COMMUNITY): Payer: Self-pay | Admitting: *Deleted

## 2015-07-04 DIAGNOSIS — Z4801 Encounter for change or removal of surgical wound dressing: Secondary | ICD-10-CM | POA: Insufficient documentation

## 2015-07-04 DIAGNOSIS — I5022 Chronic systolic (congestive) heart failure: Secondary | ICD-10-CM

## 2015-07-05 ENCOUNTER — Encounter (HOSPITAL_COMMUNITY): Payer: 59

## 2015-07-08 ENCOUNTER — Encounter (HOSPITAL_COMMUNITY): Payer: 59

## 2015-07-10 ENCOUNTER — Ambulatory Visit (HOSPITAL_COMMUNITY)
Admission: RE | Admit: 2015-07-10 | Discharge: 2015-07-10 | Disposition: A | Discharge status: Home / Self Care | Payer: 59 | Source: Ambulatory Visit | Attending: Cardiology | Admitting: Cardiology

## 2015-07-10 ENCOUNTER — Encounter (HOSPITAL_COMMUNITY): Payer: 59

## 2015-07-10 ENCOUNTER — Ambulatory Visit (HOSPITAL_COMMUNITY): Payer: Self-pay | Admitting: *Deleted

## 2015-07-10 ENCOUNTER — Other Ambulatory Visit: Payer: Self-pay

## 2015-07-10 ENCOUNTER — Encounter (HOSPITAL_COMMUNITY)
Admission: RE | Admit: 2015-07-10 | Discharge: 2015-07-10 | Disposition: A | Discharge status: Home / Self Care | Payer: 59 | Source: Ambulatory Visit | Attending: Internal Medicine | Admitting: Internal Medicine

## 2015-07-10 ENCOUNTER — Other Ambulatory Visit (HOSPITAL_COMMUNITY): Payer: Self-pay | Admitting: *Deleted

## 2015-07-10 VITALS — BP 120/0 | HR 64 | Ht 67.0 in | Wt 177.8 lb

## 2015-07-10 DIAGNOSIS — Z7982 Long term (current) use of aspirin: Secondary | ICD-10-CM | POA: Insufficient documentation

## 2015-07-10 DIAGNOSIS — I110 Hypertensive heart disease with heart failure: Secondary | ICD-10-CM | POA: Insufficient documentation

## 2015-07-10 DIAGNOSIS — M109 Gout, unspecified: Secondary | ICD-10-CM | POA: Insufficient documentation

## 2015-07-10 DIAGNOSIS — Z79899 Other long term (current) drug therapy: Secondary | ICD-10-CM | POA: Diagnosis not present

## 2015-07-10 DIAGNOSIS — F4321 Adjustment disorder with depressed mood: Secondary | ICD-10-CM | POA: Diagnosis not present

## 2015-07-10 DIAGNOSIS — I428 Other cardiomyopathies: Secondary | ICD-10-CM | POA: Insufficient documentation

## 2015-07-10 DIAGNOSIS — Z95811 Presence of heart assist device: Secondary | ICD-10-CM | POA: Insufficient documentation

## 2015-07-10 DIAGNOSIS — Z7901 Long term (current) use of anticoagulants: Secondary | ICD-10-CM | POA: Diagnosis not present

## 2015-07-10 DIAGNOSIS — I482 Chronic atrial fibrillation, unspecified: Secondary | ICD-10-CM

## 2015-07-10 DIAGNOSIS — I5023 Acute on chronic systolic (congestive) heart failure: Secondary | ICD-10-CM

## 2015-07-10 DIAGNOSIS — I10 Essential (primary) hypertension: Secondary | ICD-10-CM

## 2015-07-10 DIAGNOSIS — I5022 Chronic systolic (congestive) heart failure: Secondary | ICD-10-CM

## 2015-07-10 DIAGNOSIS — M069 Rheumatoid arthritis, unspecified: Secondary | ICD-10-CM | POA: Diagnosis not present

## 2015-07-10 DIAGNOSIS — I509 Heart failure, unspecified: Secondary | ICD-10-CM | POA: Diagnosis not present

## 2015-07-10 LAB — COMPREHENSIVE METABOLIC PANEL
ALBUMIN: 3.6 g/dL (ref 3.5–5.0)
ALK PHOS: 74 U/L (ref 38–126)
ALT: 114 U/L — AB (ref 17–63)
ANION GAP: 9 (ref 5–15)
AST: 100 U/L — AB (ref 15–41)
BILIRUBIN TOTAL: 0.4 mg/dL (ref 0.3–1.2)
BUN: 15 mg/dL (ref 6–20)
CALCIUM: 8.8 mg/dL — AB (ref 8.9–10.3)
CO2: 21 mmol/L — AB (ref 22–32)
CREATININE: 1.34 mg/dL — AB (ref 0.61–1.24)
Chloride: 111 mmol/L (ref 101–111)
GFR calc Af Amer: >60 mL/min (ref >60)
GFR calc non Af Amer: >60 mL/min (ref >60)
GLUCOSE: 95 mg/dL (ref 65–99)
Potassium: 4.6 mmol/L (ref 3.5–5.1)
SODIUM: 141 mmol/L (ref 135–145)
TOTAL PROTEIN: 7.1 g/dL (ref 6.5–8.1)

## 2015-07-10 LAB — CBC
HCT: 29.6 % — ABNORMAL LOW (ref 39.0–52.0)
Hemoglobin: 9 g/dL — ABNORMAL LOW (ref 13.0–17.0)
MCH: 20.5 pg — ABNORMAL LOW (ref 26.0–34.0)
MCHC: 30.4 g/dL (ref 30.0–36.0)
MCV: 67.6 fL — ABNORMAL LOW (ref 78.0–100.0)
PLATELETS: 185 10*3/uL (ref 150–400)
RBC: 4.38 MIL/uL (ref 4.22–5.81)
RDW: 18.5 % — ABNORMAL HIGH (ref 11.5–15.5)
WBC: 5.1 10*3/uL (ref 4.0–10.5)

## 2015-07-10 LAB — PROTIME-INR
INR: 2.54 — AB (ref 0.00–1.49)
PROTHROMBIN TIME: 27 s — AB (ref 11.6–15.2)

## 2015-07-10 LAB — DIGOXIN LEVEL: Digoxin Level: 1.1 ng/mL (ref 0.8–2.0)

## 2015-07-10 LAB — LACTATE DEHYDROGENASE: LDH: 318 U/L — AB (ref 98–192)

## 2015-07-10 LAB — TSH: TSH: 1.935 u[IU]/mL (ref 0.350–4.500)

## 2015-07-10 LAB — PREALBUMIN: PREALBUMIN: 22.2 mg/dL (ref 18–38)

## 2015-07-10 MED ORDER — LISINOPRIL 20 MG PO TABS: 20.0000 mg | ORAL_TABLET | Freq: Every day | ORAL | Status: DC

## 2015-07-10 MED ORDER — POTASSIUM CHLORIDE ER 10 MEQ PO TBCR
20.0000 meq | EXTENDED_RELEASE_TABLET | ORAL | Status: DC
Start: 2015-07-10 — End: 2015-08-26

## 2015-07-10 MED ORDER — FUROSEMIDE 40 MG PO TABS: ORAL_TABLET | ORAL | Status: DC

## 2015-07-10 NOTE — Patient Instructions (Signed)
1.  Start taking Lasix 20 mg twice a week (if you have 40 mg tablets at home, take 1/2 tab twice week). 2.  Take K-Dur 20 meq twice a week when you take your Lasix (if you have 10 meq tablets, take two pills twice week). 3.  Will call you with lab results and coumadin dosing. 4.  Return in one week for dressing change and CBC. 5.  Will send your information to [facility] for transplant evaluation. 6.  Return to [place] clinic in two months.

## 2015-07-10 NOTE — Progress Notes (Addendum)
Symptom Yes No Details  Angina          x Activity:  Claudication          x How far:  Syncope          x When:  Stroke          x   Orthopnea          x How many pillows: 1 for comfort  PND          x How often:  CPAP  N/A How many hrs:  Pedal edema          x   Abd fullness          x   N&V          x Good appetite; eating full meals  Diaphoresis          x When:  Bleeding          x No BRBPR or blood in stool  Urine       clear yellow urine   SOB          x Activity:  Palpitations          x When:  ICD shock          x   Hospitlizaitons          x When/where/why:   ED visit          x When/where/why:  Other MD          x When/who/why:  Activity   Working full time; cardiac rehab x 3 days week.   Fluid   No restrictions   Diet   No restrictions   Vital Signs: HR: 64 Doppler modified systolic: 123456 Automatic BP:  116/84 (97) SPO2: 99% Weight: 177.8  lbs Last wt:  173.2 bs  Home wts: 168 - 171  bs   VAD interrogation & Equipment Management (Reviewed with Dr Aundra Dubin): Speed: 9180 Flow:  4.0 Power: 4.9 PI:  8.5 Alarms:  Multiple low voltage advisories Events: PI events:  rare Primary Controller:  Replace back up battery in 26 months. Back up controller:   Replace back up battery in 26 months.  I reviewed the LVAD parameters from today and compared the results to the patient's prior recorded data.  LVAD interrogation was NEGATIVE for significant power changes, NEGATIVE for clinical alarms and Negative for significant PI events/speed drops. No programming changes were made and pump is functioning within specified parameters.   VAD coordinator reviewed daily log from home for daily temperature, weight, and VAD parameters. Pt is performing daily controller and system monitor self tests along with completing weekly and monthly maintenance for LVAD equipment.   LVAD equipment check completed and is in good working order.  Back-up equipment present. Pt denies any alarms or  VAD equipment issues.  Encounter Details:  Reports to clinic for two month follow up today. Reports that he is feeling great. 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 Pt is participating in cardiac rehab three days per week. Pt has not been taking his PRN lasix, is gaining wt, but feels it is due to eating.     Exit Site Care: Drive line is being maintained by VAD coordinator. Denies any fevers, chills, drainage or foul odor. VAD dressing removed and site care performed using sterile technique. Drive line exit site cleaned with Chlora prep applicators x 2, allowed to dry, and Sorbaview dressing  with biopatch on exit site applied. Exit site with complete tissue ingrowth, the velour is fully implanted at exit site. No redness, tenderness, drainage, or foul odor noted. New anchor device attached. Driveline dressing is being changed weekly per sterile technique.   6 mo Intermacs follow up completed including:  Quality of Life, KCCQ-12, and Neurocognitive trail making (61min 40 sec)  Pt completed 1420 feet during 6 minute walk without any problems.  EKG obtained and reviewed by Dr. Aundra Dubin - pt in sinus brady now (previously afib).  Patient Instructions: 1.  Start taking Lasix 20 mg twice a week (if you have 40 mg tablets at home, take 1/2 tab twice week). 2.  Take K-Dur 20 meq twice a week when you take your Lasix (if you have 10 meq tablets, take two pills twice week). 3.  Will call you with lab results and coumadin dosing. 4.  Return in one week for dressing change and CBC. 5.  Will send your information to [facility] for transplant evaluation. 6.  Return to [place] clinic in two months.   Zada Girt, RN VAD Coordinator  Office: [phone] 24/7 VAD Pager: [phone]  Patient ID: [name], male   DOB: [birth year], 45 y.o.   MRN: [id number]   LVAD CLINIC NOTE  CHF: [name]  HPI:  [name] is a 45 y/o male with systolic HF due to NICM (EF 20-25%) s/p [employer] ICD,  chronic AF and gout who underwent HM II VAD implantation on 12/20/14. 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  Follow up for Heart Failure/LVAD:  He is doing well.  No significant exertional dyspnea. MAP high at 97 today.  PI running higher than in the past.  No lightheadedness.  He was noted by ECG today to be in NSR.    Denies LVAD alarms.  Denies driveline trauma, erythema or drainage.  Denies ICD shocks.   Reports taking Coumadin as prescribed and adherence to anticoagulation based dietary restrictions.  Denies bright red blood per rectum or melena, no dark urine or hematuria.    ECG: NSR, nonspecific T wave inversions.   Past Medical History  Diagnosis Date  . Chronic systolic heart failure (HCC)     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%)  . Atrial fibrillation -parosysmal      Rx w amiodarone  . Noncompliance     H/O  MEDICAL NONCOMPLIANCE  . 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 Tricuspid valve regurgitation     SEVERE  . Severe mitral regurgitation   . Polymorphic ventricular tachycardia (HCC)     RECURRENT WITH APPROPRIATE SHOCK THERAPY IN THE PAST  . Ventricular fibrillation (HCC)     WITH APPROPRIATE SHOCK THERAPY IN THE PAST  . Hypertension   . Gout   . RA (rheumatoid arthritis) ([place])   . Automatic implantable cardiac defibrillator -BSX     single chamber  . GI bleed -massive     11 Units 2012  . Elevated LFTs   . H/O hyperthyroidism   . CHF (congestive heart failure) ([place])   . AKI (acute kidney injury) ([place])   . AICD (automatic cardioverter/defibrillator) present     Current Outpatient Prescriptions  Medication Sig Dispense Refill  . allopurinol (ZYLOPRIM) 300 MG tablet Take 1 tablet (300 mg total) by mouth daily. 30 tablet 6  . amiodarone (PACERONE) 200 MG tablet Take 0.5 tablets (100 mg total) by mouth daily. 30 tablet  6  . aspirin EC 81 MG tablet Take 81 mg by mouth  daily.    . citalopram (CELEXA) 20 MG tablet Take 1 tablet (20 mg total) by mouth daily. 30 tablet 6  . digoxin (LANOXIN) 0.125 MG tablet Take 1 tablet (0.125 mg total) by mouth daily. 30 tablet 6  . ferrous sulfate 325 (65 FE) MG tablet Take 1 tablet (325 mg total) by mouth 2 (two) times daily with a meal. 60 tablet 3  . hydrALAZINE (APRESOLINE) 100 MG tablet Take 1 tablet (100 mg total) by mouth every 8 (eight) hours. 90 tablet 3  . lisinopril (PRINIVIL,ZESTRIL) 20 MG tablet Take 1 tablet (20 mg total) by mouth daily. 30 tablet 6  . pantoprazole (PROTONIX) 40 MG tablet Take 1 tablet (40 mg total) by mouth daily. 30 tablet 6  . pregabalin (LYRICA) 75 MG capsule Take 1 capsule (75 mg total) by mouth 2 (two) times daily. 60 capsule 6  . sildenafil (REVATIO) 20 MG tablet Take 1 tablet (20 mg total) by mouth 3 (three) times daily. 90 tablet 6  . spironolactone (ALDACTONE) 25 MG tablet Take 0.5 tablets (12.5 mg total) by mouth daily. 90 tablet 3  . warfarin (COUMADIN) 2 MG tablet Take 4 mg daily except 6 mg on Saturdays: may adjust just as needed per [place] clinic (Patient taking differently: Take 4 mg daily or as directed) 90 tablet 6  . colchicine 0.6 MG tablet Take 1 tablet (0.6 mg total) by mouth 2 (two) times daily as needed (as needed for gout pain). (Patient not taking: Reported on 05/16/2015) 30 tablet 6  . furosemide (LASIX) 40 MG tablet Take 20 mg (1/2 tablet) twice week or as directed 30 tablet 6  . potassium chloride (K-DUR) 10 MEQ tablet Take 2 tablets (20 mEq total) by mouth 2 (two) times a week. Take Potassium when you take your Lasix 30 tablet 6   No current facility-administered medications for this encounter.    Review of patient's allergies indicates no known allergies.  REVIEW OF SYSTEMS: All systems negative except as listed in HPI, PMH and Problem list.   VAD interrogation & Equipment Management:  See LVAD nurse's note above.   Vital Signs:  BP 120/0 mmHg  Pulse 64  Ht 5'  7" (1.702 m)  Wt 177 lb 12.8 oz (80.65 kg)  BMI 27.84 kg/m2  SpO2 99% MAP 97  Physical Exam: GENERAL: Well appearing, male who presents to clinic today for nose bleed.  HEENT: normal  NECK: Supple, JVP 8-9 cm.  2+ bilaterally, no bruits.  No lymphadenopathy or thyromegaly appreciated.   CARDIAC:  Mechanical heart sounds with LVAD hum present.  LUNGS:  Clear to auscultation bilaterally.  ABDOMEN:  Soft, round, nontender, positive bowel sounds x4.     LVAD exit site:  Dressing dry and intact.  No erythema or drainage.  Stabilization device present and accurately applied.  Driveline dressing is being changed daily per sterile technique. EXTREMITIES:  Warm and dry, no cyanosis, clubbing, rash or edema  NEUROLOGIC:  Alert and oriented x 4.  Gait steady.  No aphasia.  No dysarthria.  Affect pleasant.      ASSESSMENT AND PLAN:   1. Chronic systolic HF: Status post HMII LVAD implantation 12/20/14.  Doing very well. Currently has NYHA class I-II symptoms on LVAD support. Mild volume overload on exam.  - I will have him take Lasix 40 mg twice a week with KCl 20 mEq on days he takes Lasix.  -  He is about 6 months out from LVAD.  Will send him down to [facility] for transplant evaluation (discussed with [facility] prior to LVAD).  2. RV failure:  Now off milrinone.  Continue sildenafil.  Continue digoxin, check level today.  3. HTN: MAP elevated. Increase lisinopril to 20 mg daily.  He will need repeat BMET in 2 wks.  4. Gout: Well controlled. Continue allopurinol. Continue colchicine prn. 5. Atrial fibrillation: Paroxysmal.  He is in NSR today. On coumadin for VAD. Continue low-dose amiodarone 100 mg daily, need to follow TSH and LFTs.  He will need regular eye exams while on amiodarone.  6. Situational depression:  Much improved. Continue Celexa. 7. Anticoagulation management: INR goal 2.0-3.0 + ASA 81 daily.   8. VAD: Parameters reviewed personally.  PI is higher, may be due to mild volume overload as well as  being in NSR rather than atrial fibrillation.   Loralie Champagne 07/10/2015

## 2015-07-11 ENCOUNTER — Encounter (HOSPITAL_COMMUNITY): Payer: 59

## 2015-07-11 LAB — T4: T4, Total: 7.7 ug/dL (ref 4.5–12.0)

## 2015-07-11 NOTE — Addendum Note (Signed)
Encounter addended by: Lezlie Octave, RN on: 07/11/2015  4:37 PM<BR>     Documentation filed: Notes Section

## 2015-07-11 NOTE — Progress Notes (Signed)
LVAD Exit Site Care:  VAD dressing removed and site care performed using sterile technique. Drive line exit site cleaned with Chlora prep applicators x 2, allowed to dry, and Sorbaview dressing with biopatch re-applied. Exit site well healed and incorporated. The velour is fully implanted at exit site. No redness, tenderness, drainage, or foul odor noted. Drive line anchor re-applied. Pt denies fever or chills. Driveline dressing is being changed weekly per sterile technique.  Pt/caregiver deny any alarms or VAD equipment issues. Back-up equipment present.    Zada Girt, RN VAD Coordinator  Office: [phone] 24/7 VAD Pager: [phone]

## 2015-07-12 ENCOUNTER — Encounter (HOSPITAL_COMMUNITY)
Admission: RE | Admit: 2015-07-12 | Discharge: 2015-07-12 | Disposition: A | Discharge status: Home / Self Care | Payer: 59 | Source: Ambulatory Visit | Attending: Internal Medicine | Admitting: Internal Medicine

## 2015-07-12 ENCOUNTER — Encounter (HOSPITAL_COMMUNITY): Payer: 59

## 2015-07-12 DIAGNOSIS — I509 Heart failure, unspecified: Secondary | ICD-10-CM | POA: Diagnosis not present

## 2015-07-15 ENCOUNTER — Encounter (HOSPITAL_COMMUNITY)
Admission: RE | Admit: 2015-07-15 | Discharge: 2015-07-15 | Disposition: A | Discharge status: Home / Self Care | Payer: 59 | Source: Ambulatory Visit | Attending: Internal Medicine | Admitting: Internal Medicine

## 2015-07-15 ENCOUNTER — Encounter (HOSPITAL_COMMUNITY): Payer: 59

## 2015-07-15 ENCOUNTER — Encounter (HOSPITAL_COMMUNITY): Payer: Self-pay

## 2015-07-15 DIAGNOSIS — I509 Heart failure, unspecified: Secondary | ICD-10-CM | POA: Diagnosis not present

## 2015-07-17 ENCOUNTER — Encounter (HOSPITAL_COMMUNITY)
Admission: RE | Admit: 2015-07-17 | Discharge: 2015-07-17 | Disposition: A | Discharge status: Home / Self Care | Payer: 59 | Source: Ambulatory Visit | Attending: Internal Medicine | Admitting: Internal Medicine

## 2015-07-17 ENCOUNTER — Other Ambulatory Visit (HOSPITAL_COMMUNITY): Payer: Self-pay | Admitting: Unknown Physician Specialty

## 2015-07-17 ENCOUNTER — Encounter (HOSPITAL_COMMUNITY): Payer: 59

## 2015-07-17 DIAGNOSIS — I509 Heart failure, unspecified: Secondary | ICD-10-CM | POA: Diagnosis not present

## 2015-07-17 DIAGNOSIS — Z79899 Other long term (current) drug therapy: Secondary | ICD-10-CM

## 2015-07-17 DIAGNOSIS — Z95811 Presence of heart assist device: Secondary | ICD-10-CM

## 2015-07-17 DIAGNOSIS — Z5181 Encounter for therapeutic drug level monitoring: Secondary | ICD-10-CM

## 2015-07-18 ENCOUNTER — Ambulatory Visit (HOSPITAL_COMMUNITY)
Admission: RE | Admit: 2015-07-18 | Discharge: 2015-07-18 | Disposition: A | Discharge status: Home / Self Care | Payer: 59 | Source: Ambulatory Visit | Attending: Cardiology | Admitting: Cardiology

## 2015-07-18 ENCOUNTER — Other Ambulatory Visit (HOSPITAL_COMMUNITY): Payer: Self-pay | Admitting: *Deleted

## 2015-07-18 DIAGNOSIS — I5023 Acute on chronic systolic (congestive) heart failure: Secondary | ICD-10-CM

## 2015-07-18 DIAGNOSIS — Z95811 Presence of heart assist device: Secondary | ICD-10-CM | POA: Diagnosis present

## 2015-07-18 DIAGNOSIS — Z79899 Other long term (current) drug therapy: Principal | ICD-10-CM

## 2015-07-18 DIAGNOSIS — Z5181 Encounter for therapeutic drug level monitoring: Secondary | ICD-10-CM

## 2015-07-18 LAB — BASIC METABOLIC PANEL
Anion gap: 7 (ref 5–15)
BUN: 24 mg/dL — ABNORMAL HIGH (ref 6–20)
CHLORIDE: 106 mmol/L (ref 101–111)
CO2: 26 mmol/L (ref 22–32)
CREATININE: 1.42 mg/dL — AB (ref 0.61–1.24)
Calcium: 8.8 mg/dL — ABNORMAL LOW (ref 8.9–10.3)
GFR, EST NON AFRICAN AMERICAN: 58 mL/min — AB (ref >60)
Glucose, Bld: 94 mg/dL (ref 65–99)
Potassium: 4.1 mmol/L (ref 3.5–5.1)
SODIUM: 139 mmol/L (ref 135–145)

## 2015-07-18 LAB — CBC
HCT: 34.7 % — ABNORMAL LOW (ref 39.0–52.0)
HEMOGLOBIN: 10.5 g/dL — AB (ref 13.0–17.0)
MCH: 20.5 pg — ABNORMAL LOW (ref 26.0–34.0)
MCHC: 30.3 g/dL (ref 30.0–36.0)
MCV: 67.6 fL — ABNORMAL LOW (ref 78.0–100.0)
PLATELETS: 191 10*3/uL (ref 150–400)
RBC: 5.13 MIL/uL (ref 4.22–5.81)
RDW: 18.3 % — ABNORMAL HIGH (ref 11.5–15.5)
WBC: 4.8 10*3/uL (ref 4.0–10.5)

## 2015-07-19 ENCOUNTER — Encounter (HOSPITAL_COMMUNITY): Payer: 59

## 2015-07-19 ENCOUNTER — Other Ambulatory Visit (HOSPITAL_COMMUNITY): Payer: Self-pay | Admitting: *Deleted

## 2015-07-19 DIAGNOSIS — Z79899 Other long term (current) drug therapy: Secondary | ICD-10-CM

## 2015-07-19 DIAGNOSIS — Z95811 Presence of heart assist device: Secondary | ICD-10-CM

## 2015-07-19 DIAGNOSIS — Z7901 Long term (current) use of anticoagulants: Secondary | ICD-10-CM

## 2015-07-19 DIAGNOSIS — Z5181 Encounter for therapeutic drug level monitoring: Secondary | ICD-10-CM

## 2015-07-22 ENCOUNTER — Encounter (HOSPITAL_COMMUNITY): Payer: 59

## 2015-07-22 ENCOUNTER — Encounter (HOSPITAL_COMMUNITY)
Admission: RE | Admit: 2015-07-22 | Discharge: 2015-07-22 | Disposition: A | Discharge status: Home / Self Care | Payer: 59 | Source: Ambulatory Visit | Attending: Internal Medicine | Admitting: Internal Medicine

## 2015-07-22 DIAGNOSIS — Z95811 Presence of heart assist device: Secondary | ICD-10-CM | POA: Insufficient documentation

## 2015-07-22 DIAGNOSIS — I509 Heart failure, unspecified: Secondary | ICD-10-CM | POA: Diagnosis present

## 2015-07-22 NOTE — Progress Notes (Signed)
Psychosocial followup assessment Patient psychosocial assessment reveals no barriers to cardiac rehab participation.   Patient  continues to exhibit healthy and positive coping skills.  Pt has good support system.  Pt was recently in a newspaper article for the [employer] about his recovery from having a LVAD placed.  Pt also has acquired a new puppy.  Pt is pleased to have "[employer]" in his home and never thought of himself as a dog person but has quickly grown to enjoy his new addition.  Pt is active in his church.  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  Patient  Feels he  making progress towards cardiac rehab goals. Patient reports feeling positive about current and projected progress toward cardiac rehab goals.  Patient's rate of progress towards goal is excellent.  Continue to monitor and intervene as needed. Cherre Huger, BSN

## 2015-07-24 ENCOUNTER — Encounter (HOSPITAL_COMMUNITY): Payer: 59

## 2015-07-25 ENCOUNTER — Inpatient Hospital Stay (HOSPITAL_COMMUNITY): Admission: RE | Admit: 2015-07-25 | Payer: 59 | Source: Ambulatory Visit

## 2015-07-25 ENCOUNTER — Telehealth (HOSPITAL_COMMUNITY): Payer: Self-pay | Admitting: *Deleted

## 2015-07-25 NOTE — Telephone Encounter (Signed)
Called pt re: missed INR appt with dressing change today. Pt is out of town due to mother's illness.  Nearest VAD center is:   [facility]   [address]  [place], [place]  24/7 [place] contact:  [phone]  Fax:  [phone]  Pt will call when he returns to reschedule INR check.

## 2015-07-26 ENCOUNTER — Encounter (HOSPITAL_COMMUNITY): Payer: 59

## 2015-07-29 ENCOUNTER — Encounter (HOSPITAL_COMMUNITY): Payer: 59

## 2015-07-29 ENCOUNTER — Other Ambulatory Visit (HOSPITAL_COMMUNITY): Payer: Self-pay | Admitting: Unknown Physician Specialty

## 2015-07-29 DIAGNOSIS — Z95811 Presence of heart assist device: Secondary | ICD-10-CM

## 2015-07-29 DIAGNOSIS — Z7901 Long term (current) use of anticoagulants: Secondary | ICD-10-CM

## 2015-07-30 ENCOUNTER — Other Ambulatory Visit (HOSPITAL_COMMUNITY): Payer: Self-pay | Admitting: *Deleted

## 2015-07-30 ENCOUNTER — Inpatient Hospital Stay (HOSPITAL_COMMUNITY): Admission: RE | Admit: 2015-07-30 | Payer: 59 | Source: Ambulatory Visit

## 2015-07-30 ENCOUNTER — Telehealth (HOSPITAL_COMMUNITY): Payer: Self-pay | Admitting: *Deleted

## 2015-07-30 DIAGNOSIS — Z79899 Other long term (current) drug therapy: Secondary | ICD-10-CM

## 2015-07-30 NOTE — Telephone Encounter (Signed)
Called pt re: missed INR today. Pt was delayed in leaving his sick mother, but will come to clinic tomorrow for labs and dressing change.

## 2015-07-31 ENCOUNTER — Other Ambulatory Visit (HOSPITAL_COMMUNITY): Payer: Self-pay | Admitting: *Deleted

## 2015-07-31 ENCOUNTER — Ambulatory Visit (HOSPITAL_COMMUNITY): Payer: Self-pay | Admitting: *Deleted

## 2015-07-31 ENCOUNTER — Encounter (HOSPITAL_COMMUNITY)
Admission: RE | Admit: 2015-07-31 | Discharge: 2015-07-31 | Disposition: A | Discharge status: Home / Self Care | Payer: 59 | Source: Ambulatory Visit | Attending: Internal Medicine | Admitting: Internal Medicine

## 2015-07-31 ENCOUNTER — Ambulatory Visit (HOSPITAL_COMMUNITY)
Admission: RE | Admit: 2015-07-31 | Discharge: 2015-07-31 | Disposition: A | Discharge status: Home / Self Care | Payer: 59 | Source: Ambulatory Visit | Attending: Internal Medicine | Admitting: Internal Medicine

## 2015-07-31 DIAGNOSIS — Z7901 Long term (current) use of anticoagulants: Secondary | ICD-10-CM

## 2015-07-31 DIAGNOSIS — Z79899 Other long term (current) drug therapy: Secondary | ICD-10-CM | POA: Diagnosis not present

## 2015-07-31 DIAGNOSIS — Z5181 Encounter for therapeutic drug level monitoring: Secondary | ICD-10-CM | POA: Diagnosis not present

## 2015-07-31 DIAGNOSIS — I5023 Acute on chronic systolic (congestive) heart failure: Secondary | ICD-10-CM

## 2015-07-31 DIAGNOSIS — Z95811 Presence of heart assist device: Secondary | ICD-10-CM | POA: Diagnosis not present

## 2015-07-31 DIAGNOSIS — I509 Heart failure, unspecified: Secondary | ICD-10-CM | POA: Diagnosis not present

## 2015-07-31 LAB — BASIC METABOLIC PANEL
Anion gap: 11 (ref 5–15)
BUN: 19 mg/dL (ref 6–20)
CHLORIDE: 104 mmol/L (ref 101–111)
CO2: 24 mmol/L (ref 22–32)
Calcium: 8.7 mg/dL — ABNORMAL LOW (ref 8.9–10.3)
Creatinine, Ser: 1.29 mg/dL — ABNORMAL HIGH (ref 0.61–1.24)
GFR calc Af Amer: >60 mL/min (ref >60)
GFR calc non Af Amer: >60 mL/min (ref >60)
GLUCOSE: 114 mg/dL — AB (ref 65–99)
POTASSIUM: 3.7 mmol/L (ref 3.5–5.1)
Sodium: 139 mmol/L (ref 135–145)

## 2015-07-31 LAB — PROTIME-INR
INR: 2.97 — AB (ref 0.00–1.49)
PROTHROMBIN TIME: 30.4 s — AB (ref 11.6–15.2)

## 2015-07-31 LAB — DIGOXIN LEVEL: Digoxin Level: 0.9 ng/mL (ref 0.8–2.0)

## 2015-07-31 NOTE — Progress Notes (Signed)
Pt returned to exercise today. Pt out for one and half week due to his mothers worsening health.  Pt mother is now in hospice care.  Pt will be out this Friday. Pt plans to return to visit his mother.  Pt given emotional support. Cherre Huger, BSN

## 2015-08-02 ENCOUNTER — Encounter (HOSPITAL_COMMUNITY): Payer: 59

## 2015-08-05 ENCOUNTER — Encounter (HOSPITAL_COMMUNITY): Payer: 59

## 2015-08-06 ENCOUNTER — Encounter: Payer: Self-pay | Admitting: Internal Medicine

## 2015-08-06 ENCOUNTER — Ambulatory Visit (INDEPENDENT_AMBULATORY_CARE_PROVIDER_SITE_OTHER): Payer: 59 | Admitting: Internal Medicine

## 2015-08-06 VITALS — BP 114/78 | HR 64 | Ht 67.0 in | Wt 176.2 lb

## 2015-08-06 DIAGNOSIS — I482 Chronic atrial fibrillation, unspecified: Secondary | ICD-10-CM

## 2015-08-06 DIAGNOSIS — I429 Cardiomyopathy, unspecified: Secondary | ICD-10-CM

## 2015-08-06 DIAGNOSIS — Z95810 Presence of automatic (implantable) cardiac defibrillator: Secondary | ICD-10-CM

## 2015-08-06 DIAGNOSIS — I472 Ventricular tachycardia, unspecified: Secondary | ICD-10-CM

## 2015-08-06 DIAGNOSIS — I5022 Chronic systolic (congestive) heart failure: Secondary | ICD-10-CM

## 2015-08-06 DIAGNOSIS — I428 Other cardiomyopathies: Secondary | ICD-10-CM

## 2015-08-06 LAB — CUP PACEART INCLINIC DEVICE CHECK
HIGH POWER IMPEDANCE MEASURED VALUE: 43 Ohm
HIGH POWER IMPEDANCE MEASURED VALUE: 51 Ohm
Implantable Lead Location: 753860
Implantable Lead Model: 144
Lead Channel Pacing Threshold Amplitude: 1.3 V
Lead Channel Pacing Threshold Pulse Width: 0.4 ms
Lead Channel Sensing Intrinsic Amplitude: 15.2 mV
Lead Channel Setting Pacing Amplitude: 2.4 V
Lead Channel Setting Pacing Pulse Width: 0.4 ms
MDC IDC LEAD IMPLANT DT: 19990517
MDC IDC LEAD SERIAL: 303019
MDC IDC MSMT LEADCHNL RV IMPEDANCE VALUE: 706 Ohm
MDC IDC SESS DTM: 20170418040000
MDC IDC SET LEADCHNL RV SENSING SENSITIVITY: 0.5 mV
Pulse Gen Serial Number: 100391

## 2015-08-06 MED ORDER — PREGABALIN 75 MG PO CAPS: 75.0000 mg | ORAL_CAPSULE | Freq: Two times a day (BID) | ORAL | Status: DC

## 2015-08-06 NOTE — Patient Instructions (Signed)
Medication Instructions: - Your physician recommends that you continue on your current medications as directed. Please refer to the Current Medication list given to you today.  Labwork: - none  Procedures/Testing: - none  Follow-Up: - Your physician recommends that you schedule a follow-up appointment in: 6 months with the Device clinic  - Your physician wants you to follow-up in: 1 year with Dr. Caryl Comes. You will receive a reminder letter in the mail two months in advance. If you don't receive a letter, please call our office to schedule the follow-up appointment.  Any Additional Special Instructions Will Be Listed Below (If Applicable).     If you need a refill on your cardiac medications before your next appointment, please call your pharmacy.

## 2015-08-06 NOTE — Progress Notes (Signed)
Patient Care Team: Sandi Mariscal, MD as PCP - General (Internal Medicine)   HPI  [name] is a 45 y.o. 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  Rx with sidenafil and dig  He also has a history of atrial fibrillation for which he previously took amiodarone;  He had elevated LFTs and amiodarone was discontinued  with improvement    Last echo 7/14 >> EF 25-30,  Mild MR, severe LAE, mild RAE, mild TR, Echo 9/16 EF 25%   7/14 reverted to atrial fib, NOAC resumed.  At anticipated DCCV pt had reverted to sinus  The patient denies chest pain, shortness of breath, nocturnal dyspnea, orthopnea or peripheral edema.  There have been no palpitations, lightheadedness or syncope.     Past Medical History  Diagnosis Date  . Chronic systolic heart failure (HCC)     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%)  . Atrial fibrillation -parosysmal      Rx w amiodarone  . Noncompliance     H/O  MEDICAL NONCOMPLIANCE  . 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 Tricuspid valve regurgitation     SEVERE  . Severe mitral regurgitation   . Polymorphic ventricular tachycardia (HCC)     RECURRENT WITH APPROPRIATE SHOCK THERAPY IN THE PAST  . Ventricular fibrillation (HCC)     WITH APPROPRIATE SHOCK THERAPY IN THE PAST  . Hypertension   . Gout   . RA (rheumatoid arthritis) ([place])   . Automatic implantable cardiac defibrillator -BSX     single chamber  . GI bleed -massive     11 Units 2012  . Elevated LFTs   . H/O hyperthyroidism   . CHF (congestive heart failure) ([place])   . AKI (acute kidney injury) ([place])   . AICD (automatic cardioverter/defibrillator) present     Past Surgical History  Procedure Laterality Date  . Cardiac catheterization   06/2006    RIGHT HEART CATH SHOWING SEVERE BIVENTRICUALR CHF WITH MARKED FILLING AND PRESSURES  . Insert / replace / remove pacemaker      GUIDANT HE ICD MODEL 2180, SERIAL # [id number]  . Cholecystectomy    . Implantable cardioverter defibrillator generator change N/A 07/01/2011    Procedure: IMPLANTABLE CARDIOVERTER DEFIBRILLATOR GENERATOR CHANGE;  Surgeon: Deboraha Sprang, MD;  Location: [facility] CATH LAB;  Service: Cardiovascular;  Laterality: N/A;  . Tee without cardioversion N/A 12/12/2014    Procedure: TRANSESOPHAGEAL ECHOCARDIOGRAM (TEE);  Surgeon: Jerline Pain, MD;  Location: [facility];  Service: Cardiovascular;  Laterality: N/A;  . Cardiac catheterization N/A 12/14/2014    Procedure: Right Heart Cath;  Surgeon: Jolaine Artist, MD;  Location: [facility] CV LAB;  Service: Cardiovascular;  Laterality: N/A;  . Cardiac catheterization N/A 12/14/2014    Procedure: IABP Insertion;  Surgeon: Jolaine Artist, MD;  Location: [facility] CV LAB;  Service: Cardiovascular;  Laterality: N/A;  . Insertion of implantable left ventricular assist device N/A 12/20/2014    Procedure: INSERTION OF IMPLANTABLE LEFT VENTRICULAR ASSIST DEVICE;  Surgeon: Ivin Poot, MD;  Location: [facility];  Service: Open Heart Surgery;  Laterality: N/A;  CIRC ARREST  NITRIC OXIDE  . Tee without cardioversion N/A 12/20/2014    Procedure: TRANSESOPHAGEAL ECHOCARDIOGRAM (TEE);  Surgeon: Ivin Poot, MD;  Location: [facility] OR;  Service: Open Heart Surgery;  Laterality: N/A;    Current Outpatient Prescriptions  Medication Sig Dispense Refill  . allopurinol (ZYLOPRIM) 300 MG tablet Take 1 tablet (300 mg total) by mouth daily. 30 tablet 6  . amiodarone (PACERONE) 200 MG tablet Take 0.5 tablets (100 mg total) by mouth daily. 30 tablet 6  . aspirin EC 81 MG tablet Take 81 mg by mouth daily.    . citalopram (CELEXA) 20 MG tablet Take 1 tablet (20 mg total) by mouth daily. 30 tablet 6  . colchicine 0.6 MG tablet Take 1 tablet (0.6 mg  total) by mouth 2 (two) times daily as needed (as needed for gout pain). 30 tablet 6  . digoxin (LANOXIN) 0.125 MG tablet Take 1 tablet (0.125 mg total) by mouth daily. 30 tablet 6  . ferrous sulfate 325 (65 FE) MG tablet Take 1 tablet (325 mg total) by mouth 2 (two) times daily with a meal. 60 tablet 3  . hydrALAZINE (APRESOLINE) 100 MG tablet Take 1 tablet (100 mg total) by mouth every 8 (eight) hours. 90 tablet 3  . lisinopril (PRINIVIL,ZESTRIL) 20 MG tablet Take 1 tablet (20 mg total) by mouth daily. 30 tablet 6  . pantoprazole (PROTONIX) 40 MG tablet Take 1 tablet (40 mg total) by mouth daily. 30 tablet 6  . potassium chloride (K-DUR) 10 MEQ tablet Take 2 tablets (20 mEq total) by mouth 2 (two) times a week. Take Potassium when you take your Lasix 30 tablet 6  . pregabalin (LYRICA) 75 MG capsule Take 1 capsule (75 mg total) by mouth 2 (two) times daily. 60 capsule 6  . sildenafil (REVATIO) 20 MG tablet Take 1 tablet (20 mg total) by mouth 3 (three) times daily. 90 tablet 6  . spironolactone (ALDACTONE) 25 MG tablet Take 0.5 tablets (12.5 mg total) by mouth daily. 90 tablet 3  . warfarin (COUMADIN) 2 MG tablet Take 4 mg daily except 6 mg on Saturdays: may adjust just as needed per [facility] (Patient taking differently: Take 4 mg daily or as directed) 90 tablet 6   No current facility-administered medications for this visit.    No Known Allergies  Review of Systems negative except from HPI and PMH  Physical Exam BP 114/78 mmHg  Pulse 64  Ht 5\' 7"  (1.702 m)  Wt 176 lb 3.2 oz (79.924 kg)  BMI 27.59 kg/m2 Well developed and well nourished in no acute distress HENT normal E scleral and icterus clear Neck Supple JVP flat; carotids brisk and full Clear to ausculation Hum Device pocket well healed; without hematoma or erythema.  There is no tethering  Soft with active bowel sounds No clubbing cyanosis none Edema Alert and oriented, grossly normal motor and sensory function Skin Warm  and Dry  ECG demonstrates atrial fibrillation/flutter  Assessment and  Plan    1. Nonischemic cardiomyopathy.   Continue current meds  2. Atrial fibrillation No intercurrent Atrial fibrillation or flutter  3. Ventricular tachycardia: No intercurrent ventricular tachycardia  4. Chronic systolic heart failure: Euvolemic.  5. Implantable defibrillator-[employer]-single chamber:The patient's device was interrogated.  The information was reviewed. No changes were made in the programming.    6. LVAD in place    Little to add,  Stable Euvolemic continue current meds  No intercurrent Ventricular tachycardia  Device function normal

## 2015-08-07 ENCOUNTER — Encounter (HOSPITAL_COMMUNITY)
Admission: RE | Admit: 2015-08-07 | Discharge: 2015-08-07 | Disposition: A | Discharge status: Home / Self Care | Payer: 59 | Source: Ambulatory Visit | Attending: Internal Medicine | Admitting: Internal Medicine

## 2015-08-07 ENCOUNTER — Other Ambulatory Visit (HOSPITAL_COMMUNITY): Payer: Self-pay | Admitting: *Deleted

## 2015-08-07 DIAGNOSIS — I428 Other cardiomyopathies: Secondary | ICD-10-CM

## 2015-08-07 DIAGNOSIS — Z95810 Presence of automatic (implantable) cardiac defibrillator: Secondary | ICD-10-CM

## 2015-08-07 DIAGNOSIS — I472 Ventricular tachycardia, unspecified: Secondary | ICD-10-CM

## 2015-08-07 DIAGNOSIS — I509 Heart failure, unspecified: Secondary | ICD-10-CM | POA: Diagnosis not present

## 2015-08-07 DIAGNOSIS — I482 Chronic atrial fibrillation, unspecified: Secondary | ICD-10-CM

## 2015-08-07 DIAGNOSIS — I5022 Chronic systolic (congestive) heart failure: Secondary | ICD-10-CM

## 2015-08-08 ENCOUNTER — Telehealth (HOSPITAL_COMMUNITY): Payer: Self-pay | Admitting: Vascular Surgery

## 2015-08-08 ENCOUNTER — Other Ambulatory Visit (HOSPITAL_COMMUNITY): Payer: Self-pay | Admitting: *Deleted

## 2015-08-08 ENCOUNTER — Ambulatory Visit (HOSPITAL_COMMUNITY)
Admission: RE | Admit: 2015-08-08 | Discharge: 2015-08-08 | Disposition: A | Discharge status: Home / Self Care | Payer: 59 | Source: Ambulatory Visit | Attending: Cardiology | Admitting: Cardiology

## 2015-08-08 ENCOUNTER — Other Ambulatory Visit: Payer: Self-pay | Admitting: *Deleted

## 2015-08-08 DIAGNOSIS — I472 Ventricular tachycardia, unspecified: Secondary | ICD-10-CM

## 2015-08-08 DIAGNOSIS — I482 Chronic atrial fibrillation, unspecified: Secondary | ICD-10-CM

## 2015-08-08 DIAGNOSIS — I428 Other cardiomyopathies: Secondary | ICD-10-CM

## 2015-08-08 DIAGNOSIS — M792 Neuralgia and neuritis, unspecified: Secondary | ICD-10-CM

## 2015-08-08 DIAGNOSIS — Z029 Encounter for administrative examinations, unspecified: Secondary | ICD-10-CM | POA: Diagnosis not present

## 2015-08-08 DIAGNOSIS — Z95810 Presence of automatic (implantable) cardiac defibrillator: Secondary | ICD-10-CM

## 2015-08-08 DIAGNOSIS — I5022 Chronic systolic (congestive) heart failure: Secondary | ICD-10-CM

## 2015-08-08 NOTE — Telephone Encounter (Signed)
I spoke with the patient.  He was wanting Lyrica filled and I advised him per Dr. Caryl Comes, that his PCP will need to fill this.

## 2015-08-08 NOTE — Telephone Encounter (Signed)
[name] went to the pharmacy  twice to pick up his medication but it wasn't there. The refill is on print in snapshot also this is not a heart medication so I didn't think I should fill it. Please advise.

## 2015-08-08 NOTE — Telephone Encounter (Signed)
[name] is waiting for Dr.[name] to sign the prescription so that we can send it. We are not allowed to e-scribe controlled substances.

## 2015-08-08 NOTE — Telephone Encounter (Signed)
Pt needs ASAP refill Lyrica

## 2015-08-08 NOTE — Progress Notes (Signed)
LVAD Exit Site Care:  VAD dressing removed and site care performed using sterile technique. Drive line exit site cleaned with Chlora prep applicators x 2, allowed to dry, and Sorbaview dressing with biopatch re-applied. Exit site well healed and incorporated. The velour is fully implanted at exit site. No redness, tenderness, drainage, or foul odor noted. Drive line anchor re-applied. Pt denies fever or chills. Driveline dressing is being changed weekly per sterile technique.  Pt denies any alarms or VAD equipment issues. Back-up equipment present.    Zada Girt, RN VAD Coordinator  Office: [phone] 24/7 VAD Pager: [phone]

## 2015-08-09 ENCOUNTER — Encounter (HOSPITAL_COMMUNITY): Payer: 59

## 2015-08-12 ENCOUNTER — Encounter (HOSPITAL_COMMUNITY): Payer: 59

## 2015-08-12 ENCOUNTER — Other Ambulatory Visit (HOSPITAL_COMMUNITY): Payer: Self-pay | Admitting: *Deleted

## 2015-08-12 MED ORDER — PANTOPRAZOLE SODIUM 40 MG PO TBEC: 40.0000 mg | DELAYED_RELEASE_TABLET | Freq: Every day | ORAL | Status: DC

## 2015-08-14 ENCOUNTER — Other Ambulatory Visit (HOSPITAL_COMMUNITY): Payer: Self-pay | Admitting: *Deleted

## 2015-08-14 ENCOUNTER — Encounter (HOSPITAL_COMMUNITY): Payer: 59

## 2015-08-14 ENCOUNTER — Inpatient Hospital Stay (HOSPITAL_COMMUNITY): Admission: RE | Admit: 2015-08-14 | Payer: 59 | Source: Ambulatory Visit

## 2015-08-14 MED ORDER — CITALOPRAM HYDROBROMIDE 20 MG PO TABS: 20.0000 mg | ORAL_TABLET | Freq: Every day | ORAL | Status: DC

## 2015-08-14 MED ORDER — DIGOXIN 125 MCG PO TABS: 0.1250 mg | ORAL_TABLET | Freq: Every day | ORAL | Status: DC

## 2015-08-15 ENCOUNTER — Telehealth (HOSPITAL_COMMUNITY): Payer: Self-pay | Admitting: *Deleted

## 2015-08-15 ENCOUNTER — Other Ambulatory Visit (HOSPITAL_COMMUNITY): Payer: Self-pay | Admitting: *Deleted

## 2015-08-15 DIAGNOSIS — Z7901 Long term (current) use of anticoagulants: Secondary | ICD-10-CM

## 2015-08-15 DIAGNOSIS — Z95811 Presence of heart assist device: Secondary | ICD-10-CM

## 2015-08-15 NOTE — Telephone Encounter (Signed)
Called pt re: missed INR appt yesterday. Pt reports his mother passed away, he is out of town and will be returning next week. Re-scheduled dressing change and INR for next week. Pt verbalized understanding of same.

## 2015-08-16 ENCOUNTER — Encounter (HOSPITAL_COMMUNITY): Payer: 59

## 2015-08-18 ENCOUNTER — Telehealth (HOSPITAL_COMMUNITY): Payer: Self-pay | Admitting: *Deleted

## 2015-08-19 ENCOUNTER — Telehealth (HOSPITAL_COMMUNITY): Payer: Self-pay | Admitting: *Deleted

## 2015-08-19 ENCOUNTER — Encounter (HOSPITAL_COMMUNITY): Payer: 59

## 2015-08-20 ENCOUNTER — Telehealth (HOSPITAL_COMMUNITY): Payer: Self-pay | Admitting: *Deleted

## 2015-08-20 NOTE — Telephone Encounter (Signed)
Sister called VAD pager to report pt has been experiencing dizzy episodes starting yesterday.  States he fell earlier today, she was assisting to BR and noted he had bloody urine. VAD parameters flow 4.1, speed 9200, power 5.0, PI 6.5. Sister denies any VAD alarms.  States pt has had very little appetite for past 48 hours. They are currently in [place], [place] (following mom's funeral in [place]).  Advised sister per Dr. Haroldine Laws to take pt to nearest VAD center [facility]) ED and have him assessed.  Asked sister to have MD contact Dr. Haroldine Laws directly; contact info given. Sister verbalized understanding of same.

## 2015-08-20 NOTE — Telephone Encounter (Signed)
Called VAD coordinator at [facility] for update re: [name].  She reports pt was admitted for pyelonephritis, sepsis from ED last night. Unable to obtain UA, unable to pass catheter due to "blockage". INR was 2.8, WBC 21.6, lactic acid 1.3, LDH 500.  Asked her to contact Dr. Haroldine Laws directly if any questions (contact info given).  She will pass on contact information to [name], NP who is the inpatient NP for VAD pts.   Attending: Dr. Hector Shade  At [phone] [facility] ([place] Coordinator):  [phone]

## 2015-08-20 NOTE — Telephone Encounter (Signed)
[name], NP paged VAD pager to give update on patient. Dx stands as pyelonephritis with BPH obstruction. Urology consulted and placed foley, may need to stay in place for a while.  Pt found to be C-diff positive today (states mother and some family members had positive hx of C-diff).  Pt had coag + blood cultures today with max temp 101.4; ID has been consulted.  Hgb drop of 2 gms with positive FOBT in stool. Suspect GI bleed. CT scan revealed dilated ureter, suspicious for kidney stone, but pt has been asymptomatic prior to this admission.   Labs improved today as follows: LDH 500 --->225 Creat 1.9--->1.6 UBC 21.6--->11.8  Will fax patient information to VAD team at [facility]: Fax:  [phone] IP NP: [phone] [name], NP:  [phone]  Dr. Haroldine Laws updated and direct contact info shared with Erin and Dr. Haroldine Laws.  Asked her to call Dr. Haroldine Laws with any questions, issues or have Dr. Hector Shade call Dr. Haroldine Laws.  She agreed to same.

## 2015-08-21 ENCOUNTER — Encounter (HOSPITAL_COMMUNITY): Payer: 59

## 2015-08-23 ENCOUNTER — Telehealth (HOSPITAL_COMMUNITY): Payer: Self-pay | Admitting: *Deleted

## 2015-08-23 ENCOUNTER — Other Ambulatory Visit (HOSPITAL_COMMUNITY): Payer: 59

## 2015-08-23 ENCOUNTER — Encounter (HOSPITAL_COMMUNITY): Admission: RE | Admit: 2015-08-23 | Payer: 59 | Source: Ambulatory Visit

## 2015-08-23 NOTE — Telephone Encounter (Signed)
Unable to leave message for pt.  Pt voicemail is full.  Pt sent letter letting him know his last day to attend cardiac rehab was 08/23/15.  Pt given 18 weeks to complete a 12 week program.  Pt started CR on 04/24/2015.  Pt sent his graduation information, request to return parking badge and graduation certificate. Cherre Huger, BSN

## 2015-08-26 ENCOUNTER — Inpatient Hospital Stay (HOSPITAL_COMMUNITY)
Admission: AD | Admit: 2015-08-26 | Discharge: 2015-09-25 | DRG: 264 | Disposition: A | Discharge status: Rehabilitation Facility | Payer: 59 | Source: Ambulatory Visit | Attending: Internal Medicine | Admitting: Internal Medicine

## 2015-08-26 ENCOUNTER — Inpatient Hospital Stay (HOSPITAL_COMMUNITY): Payer: 59

## 2015-08-26 DIAGNOSIS — R1110 Vomiting, unspecified: Secondary | ICD-10-CM

## 2015-08-26 DIAGNOSIS — R5381 Other malaise: Secondary | ICD-10-CM

## 2015-08-26 DIAGNOSIS — J150 Pneumonia due to Klebsiella pneumoniae: Secondary | ICD-10-CM | POA: Diagnosis not present

## 2015-08-26 DIAGNOSIS — R042 Hemoptysis: Secondary | ICD-10-CM | POA: Diagnosis not present

## 2015-08-26 DIAGNOSIS — T829XXA Unspecified complication of cardiac and vascular prosthetic device, implant and graft, initial encounter: Secondary | ICD-10-CM | POA: Diagnosis present

## 2015-08-26 DIAGNOSIS — A047 Enterocolitis due to Clostridium difficile: Secondary | ICD-10-CM | POA: Diagnosis present

## 2015-08-26 DIAGNOSIS — R7881 Bacteremia: Secondary | ICD-10-CM | POA: Insufficient documentation

## 2015-08-26 DIAGNOSIS — I110 Hypertensive heart disease with heart failure: Secondary | ICD-10-CM | POA: Diagnosis present

## 2015-08-26 DIAGNOSIS — Z452 Encounter for adjustment and management of vascular access device: Secondary | ICD-10-CM

## 2015-08-26 DIAGNOSIS — T366X5A Adverse effect of rifampicins, initial encounter: Secondary | ICD-10-CM | POA: Diagnosis not present

## 2015-08-26 DIAGNOSIS — D62 Acute posthemorrhagic anemia: Secondary | ICD-10-CM | POA: Diagnosis not present

## 2015-08-26 DIAGNOSIS — N185 Chronic kidney disease, stage 5: Secondary | ICD-10-CM | POA: Diagnosis not present

## 2015-08-26 DIAGNOSIS — I5023 Acute on chronic systolic (congestive) heart failure: Secondary | ICD-10-CM | POA: Insufficient documentation

## 2015-08-26 DIAGNOSIS — I5022 Chronic systolic (congestive) heart failure: Secondary | ICD-10-CM | POA: Insufficient documentation

## 2015-08-26 DIAGNOSIS — Z79899 Other long term (current) drug therapy: Secondary | ICD-10-CM | POA: Diagnosis not present

## 2015-08-26 DIAGNOSIS — T827XXA Infection and inflammatory reaction due to other cardiac and vascular devices, implants and grafts, initial encounter: Secondary | ICD-10-CM | POA: Diagnosis present

## 2015-08-26 DIAGNOSIS — E876 Hypokalemia: Secondary | ICD-10-CM | POA: Diagnosis not present

## 2015-08-26 DIAGNOSIS — Z6826 Body mass index (BMI) 26.0-26.9, adult: Secondary | ICD-10-CM | POA: Diagnosis not present

## 2015-08-26 DIAGNOSIS — I428 Other cardiomyopathies: Secondary | ICD-10-CM | POA: Diagnosis present

## 2015-08-26 DIAGNOSIS — Z8674 Personal history of sudden cardiac arrest: Secondary | ICD-10-CM | POA: Diagnosis not present

## 2015-08-26 DIAGNOSIS — I4892 Unspecified atrial flutter: Secondary | ICD-10-CM | POA: Diagnosis not present

## 2015-08-26 DIAGNOSIS — R112 Nausea with vomiting, unspecified: Secondary | ICD-10-CM | POA: Diagnosis not present

## 2015-08-26 DIAGNOSIS — I482 Chronic atrial fibrillation, unspecified: Secondary | ICD-10-CM

## 2015-08-26 DIAGNOSIS — T8132XA Disruption of internal operation (surgical) wound, not elsewhere classified, initial encounter: Secondary | ICD-10-CM

## 2015-08-26 DIAGNOSIS — I480 Paroxysmal atrial fibrillation: Secondary | ICD-10-CM | POA: Diagnosis present

## 2015-08-26 DIAGNOSIS — A419 Sepsis, unspecified organism: Secondary | ICD-10-CM | POA: Diagnosis not present

## 2015-08-26 DIAGNOSIS — Y831 Surgical operation with implant of artificial internal device as the cause of abnormal reaction of the patient, or of later complication, without mention of misadventure at the time of the procedure: Secondary | ICD-10-CM | POA: Diagnosis present

## 2015-08-26 DIAGNOSIS — Z419 Encounter for procedure for purposes other than remedying health state, unspecified: Secondary | ICD-10-CM

## 2015-08-26 DIAGNOSIS — E43 Unspecified severe protein-calorie malnutrition: Secondary | ICD-10-CM | POA: Diagnosis present

## 2015-08-26 DIAGNOSIS — A4101 Sepsis due to Methicillin susceptible Staphylococcus aureus: Secondary | ICD-10-CM

## 2015-08-26 DIAGNOSIS — Z7982 Long term (current) use of aspirin: Secondary | ICD-10-CM | POA: Diagnosis not present

## 2015-08-26 DIAGNOSIS — I472 Ventricular tachycardia, unspecified: Secondary | ICD-10-CM

## 2015-08-26 DIAGNOSIS — Z95810 Presence of automatic (implantable) cardiac defibrillator: Secondary | ICD-10-CM | POA: Diagnosis not present

## 2015-08-26 DIAGNOSIS — K567 Ileus, unspecified: Secondary | ICD-10-CM | POA: Diagnosis not present

## 2015-08-26 DIAGNOSIS — D509 Iron deficiency anemia, unspecified: Secondary | ICD-10-CM | POA: Diagnosis present

## 2015-08-26 DIAGNOSIS — F329 Major depressive disorder, single episode, unspecified: Secondary | ICD-10-CM | POA: Diagnosis present

## 2015-08-26 DIAGNOSIS — B9689 Other specified bacterial agents as the cause of diseases classified elsewhere: Secondary | ICD-10-CM | POA: Diagnosis not present

## 2015-08-26 DIAGNOSIS — R6521 Severe sepsis with septic shock: Secondary | ICD-10-CM | POA: Diagnosis not present

## 2015-08-26 DIAGNOSIS — Z95811 Presence of heart assist device: Secondary | ICD-10-CM | POA: Diagnosis not present

## 2015-08-26 DIAGNOSIS — R34 Anuria and oliguria: Secondary | ICD-10-CM | POA: Diagnosis not present

## 2015-08-26 DIAGNOSIS — M109 Gout, unspecified: Secondary | ICD-10-CM | POA: Diagnosis present

## 2015-08-26 DIAGNOSIS — B9561 Methicillin susceptible Staphylococcus aureus infection as the cause of diseases classified elsewhere: Secondary | ICD-10-CM | POA: Diagnosis not present

## 2015-08-26 DIAGNOSIS — K7290 Hepatic failure, unspecified without coma: Secondary | ICD-10-CM | POA: Diagnosis not present

## 2015-08-26 DIAGNOSIS — Y828 Other medical devices associated with adverse incidents: Secondary | ICD-10-CM | POA: Diagnosis not present

## 2015-08-26 DIAGNOSIS — T829XXS Unspecified complication of cardiac and vascular prosthetic device, implant and graft, sequela: Secondary | ICD-10-CM | POA: Diagnosis not present

## 2015-08-26 DIAGNOSIS — K529 Noninfective gastroenteritis and colitis, unspecified: Secondary | ICD-10-CM | POA: Diagnosis not present

## 2015-08-26 DIAGNOSIS — T826XXA Infection and inflammatory reaction due to cardiac valve prosthesis, initial encounter: Secondary | ICD-10-CM | POA: Diagnosis not present

## 2015-08-26 DIAGNOSIS — Z7901 Long term (current) use of anticoagulants: Secondary | ICD-10-CM | POA: Diagnosis not present

## 2015-08-26 DIAGNOSIS — T148 Other injury of unspecified body region: Secondary | ICD-10-CM | POA: Diagnosis not present

## 2015-08-26 DIAGNOSIS — B3749 Other urogenital candidiasis: Secondary | ICD-10-CM | POA: Diagnosis not present

## 2015-08-26 DIAGNOSIS — J690 Pneumonitis due to inhalation of food and vomit: Secondary | ICD-10-CM | POA: Diagnosis not present

## 2015-08-26 DIAGNOSIS — T829XXD Unspecified complication of cardiac and vascular prosthetic device, implant and graft, subsequent encounter: Secondary | ICD-10-CM | POA: Diagnosis not present

## 2015-08-26 DIAGNOSIS — R339 Retention of urine, unspecified: Secondary | ICD-10-CM | POA: Diagnosis present

## 2015-08-26 DIAGNOSIS — L02211 Cutaneous abscess of abdominal wall: Secondary | ICD-10-CM | POA: Insufficient documentation

## 2015-08-26 DIAGNOSIS — Y838 Other surgical procedures as the cause of abnormal reaction of the patient, or of later complication, without mention of misadventure at the time of the procedure: Secondary | ICD-10-CM | POA: Diagnosis not present

## 2015-08-26 DIAGNOSIS — R0602 Shortness of breath: Secondary | ICD-10-CM

## 2015-08-26 DIAGNOSIS — N170 Acute kidney failure with tubular necrosis: Secondary | ICD-10-CM | POA: Diagnosis not present

## 2015-08-26 DIAGNOSIS — S21102A Unspecified open wound of left front wall of thorax without penetration into thoracic cavity, initial encounter: Secondary | ICD-10-CM

## 2015-08-26 DIAGNOSIS — T827XXD Infection and inflammatory reaction due to other cardiac and vascular devices, implants and grafts, subsequent encounter: Secondary | ICD-10-CM | POA: Diagnosis not present

## 2015-08-26 DIAGNOSIS — N179 Acute kidney failure, unspecified: Secondary | ICD-10-CM

## 2015-08-26 DIAGNOSIS — Z4659 Encounter for fitting and adjustment of other gastrointestinal appliance and device: Secondary | ICD-10-CM

## 2015-08-26 DIAGNOSIS — I9789 Other postprocedural complications and disorders of the circulatory system, not elsewhere classified: Secondary | ICD-10-CM | POA: Diagnosis not present

## 2015-08-26 DIAGNOSIS — M069 Rheumatoid arthritis, unspecified: Secondary | ICD-10-CM | POA: Diagnosis present

## 2015-08-26 DIAGNOSIS — L089 Local infection of the skin and subcutaneous tissue, unspecified: Secondary | ICD-10-CM | POA: Diagnosis not present

## 2015-08-26 DIAGNOSIS — R140 Abdominal distension (gaseous): Secondary | ICD-10-CM

## 2015-08-26 DIAGNOSIS — T17908A Unspecified foreign body in respiratory tract, part unspecified causing other injury, initial encounter: Secondary | ICD-10-CM

## 2015-08-26 DIAGNOSIS — A0472 Enterocolitis due to Clostridium difficile, not specified as recurrent: Secondary | ICD-10-CM | POA: Insufficient documentation

## 2015-08-26 DIAGNOSIS — Y849 Medical procedure, unspecified as the cause of abnormal reaction of the patient, or of later complication, without mention of misadventure at the time of the procedure: Secondary | ICD-10-CM | POA: Diagnosis not present

## 2015-08-26 LAB — CBC WITH DIFFERENTIAL/PLATELET
BASOS ABS: 0 10*3/uL (ref 0.0–0.1)
BASOS PCT: 0 %
EOS ABS: 0 10*3/uL (ref 0.0–0.7)
Eosinophils Relative: 0 %
HCT: 25.7 % — ABNORMAL LOW (ref 39.0–52.0)
Hemoglobin: 8.4 g/dL — ABNORMAL LOW (ref 13.0–17.0)
LYMPHS ABS: 1 10*3/uL (ref 0.7–4.0)
Lymphocytes Relative: 9 %
MCH: 20.4 pg — AB (ref 26.0–34.0)
MCHC: 32.7 g/dL (ref 30.0–36.0)
MCV: 62.4 fL — AB (ref 78.0–100.0)
Monocytes Absolute: 0.7 10*3/uL (ref 0.1–1.0)
Monocytes Relative: 6 %
NEUTROS ABS: 9.7 10*3/uL — AB (ref 1.7–7.7)
Neutrophils Relative %: 85 %
PLATELETS: 331 10*3/uL (ref 150–400)
RBC: 4.12 MIL/uL — ABNORMAL LOW (ref 4.22–5.81)
RDW: 20 % — AB (ref 11.5–15.5)
WBC: 11.4 10*3/uL — ABNORMAL HIGH (ref 4.0–10.5)

## 2015-08-26 LAB — URINALYSIS, ROUTINE W REFLEX MICROSCOPIC
BILIRUBIN URINE: NEGATIVE
GLUCOSE, UA: NEGATIVE mg/dL
HGB URINE DIPSTICK: NEGATIVE
KETONES UR: 15 mg/dL — AB
LEUKOCYTES UA: NEGATIVE
Nitrite: NEGATIVE
PH: 6.5 (ref 5.0–8.0)
Protein, ur: 30 mg/dL — AB
Specific Gravity, Urine: 1.026 (ref 1.005–1.030)

## 2015-08-26 LAB — URINE MICROSCOPIC-ADD ON

## 2015-08-26 LAB — MAGNESIUM: MAGNESIUM: 1.9 mg/dL (ref 1.7–2.4)

## 2015-08-26 LAB — LACTATE DEHYDROGENASE: LDH: 186 U/L (ref 98–192)

## 2015-08-26 LAB — GLUCOSE, CAPILLARY: Glucose-Capillary: 77 mg/dL (ref 65–99)

## 2015-08-26 LAB — PROTIME-INR
INR: 2.82 — AB (ref 0.00–1.49)
PROTHROMBIN TIME: 29.2 s — AB (ref 11.6–15.2)

## 2015-08-26 MED ORDER — BISACODYL 10 MG RE SUPP: 10.0000 mg | Freq: Every day | RECTAL | Status: DC | PRN

## 2015-08-26 MED ORDER — CITALOPRAM HYDROBROMIDE 20 MG PO TABS
20.0000 mg | ORAL_TABLET | Freq: Every day | ORAL | Status: DC
  Administered 2015-08-27 – 2015-09-25 (×26): 20 mg via ORAL
  Filled 2015-08-26 (×30): qty 1

## 2015-08-26 MED ORDER — HYDRALAZINE HCL 50 MG PO TABS
100.0000 mg | ORAL_TABLET | Freq: Three times a day (TID) | ORAL | Status: DC
  Administered 2015-08-27 – 2015-09-01 (×16): 100 mg via ORAL
  Filled 2015-08-26 (×17): qty 2

## 2015-08-26 MED ORDER — BISACODYL 5 MG PO TBEC: 10.0000 mg | DELAYED_RELEASE_TABLET | Freq: Every day | ORAL | Status: DC | PRN

## 2015-08-26 MED ORDER — DOCUSATE SODIUM 100 MG PO CAPS
200.0000 mg | ORAL_CAPSULE | Freq: Every day | ORAL | Status: DC
  Administered 2015-09-03 – 2015-09-11 (×2): 200 mg via ORAL
  Filled 2015-08-26 (×9): qty 2

## 2015-08-26 MED ORDER — DIGOXIN 125 MCG PO TABS
0.1250 mg | ORAL_TABLET | Freq: Every day | ORAL | Status: DC
  Administered 2015-08-27 – 2015-09-01 (×5): 0.125 mg via ORAL
  Filled 2015-08-26 (×5): qty 1

## 2015-08-26 MED ORDER — SODIUM CHLORIDE 0.9% FLUSH: 3.0000 mL | INTRAVENOUS | Status: DC | PRN

## 2015-08-26 MED ORDER — TAMSULOSIN HCL 0.4 MG PO CAPS
0.4000 mg | ORAL_CAPSULE | Freq: Every day | ORAL | Status: DC
  Administered 2015-08-27 – 2015-09-25 (×28): 0.4 mg via ORAL
  Filled 2015-08-26 (×28): qty 1

## 2015-08-26 MED ORDER — PREGABALIN 25 MG PO CAPS
75.0000 mg | ORAL_CAPSULE | Freq: Two times a day (BID) | ORAL | Status: DC
  Administered 2015-08-26 – 2015-09-11 (×28): 75 mg via ORAL
  Filled 2015-08-26 (×29): qty 3

## 2015-08-26 MED ORDER — NAFCILLIN SODIUM 2 G IJ SOLR
2.0000 g | INTRAVENOUS | Status: DC
  Administered 2015-08-26 – 2015-09-02 (×40): 2 g via INTRAVENOUS
  Filled 2015-08-26 (×45): qty 2000

## 2015-08-26 MED ORDER — ONDANSETRON HCL 4 MG/2ML IJ SOLN
4.0000 mg | INTRAMUSCULAR | Status: DC | PRN
Start: 2015-08-26 — End: 2015-09-25
  Administered 2015-09-01 – 2015-09-17 (×9): 4 mg via INTRAVENOUS
  Filled 2015-08-26 (×9): qty 2

## 2015-08-26 MED ORDER — PANTOPRAZOLE SODIUM 40 MG PO TBEC
40.0000 mg | DELAYED_RELEASE_TABLET | Freq: Every day | ORAL | Status: DC
  Administered 2015-08-27 – 2015-09-10 (×14): 40 mg via ORAL
  Filled 2015-08-26 (×14): qty 1

## 2015-08-26 MED ORDER — SODIUM CHLORIDE 0.9% FLUSH
3.0000 mL | Freq: Two times a day (BID) | INTRAVENOUS | Status: DC
  Administered 2015-08-26 – 2015-08-27 (×2): 10 mL via INTRAVENOUS
  Administered 2015-08-30 – 2015-09-13 (×10): 3 mL via INTRAVENOUS

## 2015-08-26 MED ORDER — SODIUM CHLORIDE 0.9 % IV SOLN
250.0000 mL | INTRAVENOUS | Status: DC | PRN
  Administered 2015-08-26: 250 mL via INTRAVENOUS
  Administered 2015-09-05: 500 mL via INTRAVENOUS
  Administered 2015-09-09: 09:00:00 via INTRAVENOUS

## 2015-08-26 MED ORDER — VANCOMYCIN 50 MG/ML ORAL SOLUTION: 125.0000 mg | Freq: Four times a day (QID) | ORAL | Status: DC

## 2015-08-26 MED ORDER — AMIODARONE HCL 200 MG PO TABS
100.0000 mg | ORAL_TABLET | Freq: Every day | ORAL | Status: DC
  Administered 2015-08-27 – 2015-09-15 (×18): 100 mg via ORAL
  Filled 2015-08-26 (×19): qty 1

## 2015-08-26 MED ORDER — ALLOPURINOL 300 MG PO TABS
300.0000 mg | ORAL_TABLET | Freq: Every day | ORAL | Status: DC
  Administered 2015-08-27 – 2015-09-11 (×13): 300 mg via ORAL
  Filled 2015-08-26 (×13): qty 1

## 2015-08-26 MED ORDER — TRAMADOL HCL 50 MG PO TABS
50.0000 mg | ORAL_TABLET | Freq: Four times a day (QID) | ORAL | Status: DC | PRN
  Administered 2015-08-26 – 2015-09-20 (×11): 50 mg via ORAL
  Filled 2015-08-26 (×11): qty 1

## 2015-08-26 MED ORDER — SPIRONOLACTONE 25 MG PO TABS
12.5000 mg | ORAL_TABLET | Freq: Every day | ORAL | Status: DC
  Administered 2015-08-27: 12.5 mg via ORAL
  Filled 2015-08-26: qty 1

## 2015-08-26 MED ORDER — ZOLPIDEM TARTRATE 5 MG PO TABS
5.0000 mg | ORAL_TABLET | Freq: Every evening | ORAL | Status: DC | PRN
  Administered 2015-08-26 – 2015-08-29 (×3): 5 mg via ORAL
  Filled 2015-08-26 (×3): qty 1

## 2015-08-26 MED ORDER — FERROUS SULFATE 325 (65 FE) MG PO TABS
325.0000 mg | ORAL_TABLET | Freq: Two times a day (BID) | ORAL | Status: DC
  Administered 2015-08-27 – 2015-09-15 (×33): 325 mg via ORAL
  Filled 2015-08-26 (×35): qty 1

## 2015-08-26 MED ORDER — SILDENAFIL CITRATE 20 MG PO TABS
20.0000 mg | ORAL_TABLET | Freq: Three times a day (TID) | ORAL | Status: DC
  Administered 2015-08-26 – 2015-09-02 (×17): 20 mg via ORAL
  Filled 2015-08-26 (×21): qty 1

## 2015-08-26 NOTE — H&P (Addendum)
VAD TEAM History & Physical Note   Reason for Admission: C.diff colitis. Driveline abscess   HPI:     [name] is a 45 y/o male with systolic HF due to NICM (EF 20-25%) s/p [employer] ICD, chronic AF and gout who underwent HM II VAD implantation on 12/20/14. Hospital course complicated by RV failure and was discharged on milrinone which was eventually weaned off.  Recently travelled to [facility] to see his mother who was dying from a long illness. While there developed fevers and chills and abdominal pain. Went to [facility] in [place], [place]. Initially felt to have pyelonephritis but CT scan negative. Found to have C. Diff colitis. Started on oral vanc. Last Friday developed subxiphoid swelling and subsequently had repeat CT which showed large abscess surrounding driveline. Bcx 2/2 MSSA. Seen by ID and started on Nafcillin and transferred here for management of abscess and possible pump exchange.  Had Foley placed due to urinary retention.   No longer febrile. Diarrhea much improved but not resolved. MAPs 88. INR 2.8    LVAD INTERROGATION:  HeartMate II LVAD:  Flow 5.1 liters/min, speed 9200, power 5.5, PI 7.0.   No PI events   Review of Systems: [y] = yes, [ ]  = no   General: Weight gain [ ] ; Weight loss [ ] ; Anorexia [ ] ; Fatigue [ y]; Fever [ y]; Chills Blue.Reese ]; Weakness Blue.Reese ]  Cardiac: Chest pain/pressure [ ] ; Resting SOB [ ] ; Exertional SOB [ ] ; Orthopnea [ ] ; Pedal Edema [ ] ; Palpitations [ ] ; Syncope [ ] ; Presyncope [ ] ; Paroxysmal nocturnal dyspnea[ ]   Pulmonary: Cough [ ] ; Wheezing[ ] ; Hemoptysis[ ] ; Sputum [ ] ; Snoring [ ]   GI: Vomiting[ ] ; Dysphagia[ ] ; Melena[ ] ; Hematochezia [ ] ; Heartburn[ ] ; Abdominal pain Blue.Reese ]; Constipation [ ] ; Diarrhea Blue.Reese ]; BRBPR [ ]   GU: Hematuria[ ] ; Dysuria [ ] ; Nocturia[ ]   Vascular: Pain in legs with walking [ ] ; Pain in feet with lying flat [ ] ; Non-healing sores [ ] ; Stroke [ ] ; TIA [ ] ; Slurred speech [ ] ;  Neuro: Headaches[ ] ;  Vertigo[ ] ; Seizures[ ] ; Paresthesias[ ] ;Blurred vision [ ] ; Diplopia [ ] ; Vision changes [ ]   Ortho/Skin: Arthritis [ ] ; Joint pain [ ] ; Muscle pain [ ] ; Joint swelling [ ] ; Back Pain [ ] ; Rash [ ]   Psych: Depression[ ] ; Anxiety[ ]   Heme: Bleeding problems [ ] ; Clotting disorders [ ] ; Anemia [ ]   Endocrine: Diabetes [ ] ; Thyroid dysfunction[ ]   Home Medications Prior to Admission medications   Medication Sig Start Date End Date Taking? Authorizing Provider  allopurinol (ZYLOPRIM) 300 MG tablet Take 1 tablet (300 mg total) by mouth daily. 01/08/15   Amy D Ninfa Meeker, NP  amiodarone (PACERONE) 200 MG tablet Take 0.5 tablets (100 mg total) by mouth daily. 05/16/15   Larey Dresser, MD  aspirin EC 81 MG tablet Take 81 mg by mouth daily.    Historical Provider, MD  citalopram (CELEXA) 20 MG tablet Take 1 tablet (20 mg total) by mouth daily. 08/14/15   Amy D Ninfa Meeker, NP  colchicine 0.6 MG tablet Take 1 tablet (0.6 mg total) by mouth 2 (two) times daily as needed (as needed for gout pain). 01/14/15   Jolaine Artist, MD  digoxin (LANOXIN) 0.125 MG tablet Take 1 tablet (0.125 mg total) by mouth daily. 08/14/15   Amy D Ninfa Meeker, NP  ferrous sulfate 325 (65 FE) MG tablet Take  1 tablet (325 mg total) by mouth 2 (two) times daily with a meal. 05/22/15   Jolaine Artist, MD  hydrALAZINE (APRESOLINE) 100 MG tablet Take 1 tablet (100 mg total) by mouth every 8 (eight) hours. 05/21/15   Amy D Clegg, NP  lisinopril (PRINIVIL,ZESTRIL) 20 MG tablet Take 1 tablet (20 mg total) by mouth daily. 07/10/15   Larey Dresser, MD  pantoprazole (PROTONIX) 40 MG tablet Take 1 tablet (40 mg total) by mouth daily. 08/12/15   Amy D Clegg, NP  potassium chloride (K-DUR) 10 MEQ tablet Take 2 tablets (20 mEq total) by mouth 2 (two) times a week. Take Potassium when you take your Lasix 07/10/15   Larey Dresser, MD  pregabalin (LYRICA) 75 MG capsule Take 1 capsule (75 mg total) by mouth 2 (two) times daily. 08/06/15   Deboraha Sprang, MD    sildenafil (REVATIO) 20 MG tablet Take 1 tablet (20 mg total) by mouth 3 (three) times daily. 01/08/15   Amy D Ninfa Meeker, NP  spironolactone (ALDACTONE) 25 MG tablet Take 0.5 tablets (12.5 mg total) by mouth daily. 01/14/15   Jolaine Artist, MD  warfarin (COUMADIN) 2 MG tablet Take 4 mg daily except 6 mg on Saturdays: may adjust just as needed per [facility] Patient taking differently: Take 4 mg daily or as directed 05/21/15   Conrad Bennett, NP    Past Medical History: Past Medical History  Diagnosis Date  . Chronic systolic heart failure (HCC)     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%)  . Atrial fibrillation -parosysmal      Rx w amiodarone  . Noncompliance     H/O  MEDICAL NONCOMPLIANCE  . 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 Tricuspid valve regurgitation     SEVERE  . Severe mitral regurgitation   . Polymorphic ventricular tachycardia (HCC)     RECURRENT WITH APPROPRIATE SHOCK THERAPY IN THE PAST  . Ventricular fibrillation (HCC)     WITH APPROPRIATE SHOCK THERAPY IN THE PAST  . Hypertension   . Gout   . RA (rheumatoid arthritis) ([place])   . Automatic implantable cardiac defibrillator -BSX     single chamber  . GI bleed -massive     11 Units 2012  . Elevated LFTs   . H/O hyperthyroidism   . CHF (congestive heart failure) ([place])   . AKI (acute kidney injury) ([place])   . AICD (automatic cardioverter/defibrillator) present     Past Surgical History: Past Surgical History  Procedure Laterality Date  . Cardiac catheterization  06/2006    RIGHT HEART CATH SHOWING SEVERE BIVENTRICUALR CHF WITH MARKED FILLING AND PRESSURES  . Insert / replace / remove pacemaker      GUIDANT HE ICD MODEL 2180, SERIAL # [id number]  . Cholecystectomy    . Implantable cardioverter defibrillator generator change N/A 07/01/2011    Procedure: IMPLANTABLE CARDIOVERTER DEFIBRILLATOR GENERATOR CHANGE;  Surgeon: Deboraha Sprang, MD;  Location[facility] CATH LAB;   Service: Cardiovascular;  Laterality: N/A;  . Tee without cardioversion N/A 12/12/2014    Procedure: TRANSESOPHAGEAL ECHOCARDIOGRAM (TEE);  Surgeon: Jerline Pain, MD;  Location: [facility];  Service: Cardiovascular;  Laterality: N/A;  . Cardiac catheterization N/A 12/14/2014    Procedure: Right Heart Cath;  Surgeon: Jolaine Artist, MD;  Location: [facility] CV LAB;  Service: Cardiovascular;  Laterality: N/A;  . Cardiac catheterization N/A 12/14/2014    Procedure: IABP Insertion;  Surgeon: Jolaine Artist, MD;  Location: [facility] CV LAB;  Service: Cardiovascular;  Laterality: N/A;  . Insertion of implantable left ventricular assist device N/A 12/20/2014    Procedure: INSERTION OF IMPLANTABLE LEFT VENTRICULAR ASSIST DEVICE;  Surgeon: Ivin Poot, MD;  Location: [facility];  Service: Open Heart Surgery;  Laterality: N/A;  CIRC ARREST  NITRIC OXIDE  . Tee without cardioversion N/A 12/20/2014    Procedure: TRANSESOPHAGEAL ECHOCARDIOGRAM (TEE);  Surgeon: Ivin Poot, MD;  Location: [facility];  Service: Open Heart Surgery;  Laterality: N/A;    Family History: Family History  Problem Relation Age of Onset  . Heart failure Brother     Social History: Social History   Social History  . Marital Status: Single    Spouse Name: N/A  . Number of Children: N/A  . Years of Education: N/A   Occupational History  . [employer]   Social History Main Topics  . Smoking status: Never Smoker   . Smokeless tobacco: Never Used  . Alcohol Use: No  . Drug Use: No  . Sexual Activity: Not on file   Other Topics Concern  . Not on file   Social History Narrative   LIVES IN [place]   SINGLE   TOBACCO USE .Marland KitchenMarland KitchenNO   ALCOHOL USE.Marland Kitchen NO   REGULAR EXERCISE.Marland Kitchen NO   DRUG USE... NO    Allergies:  No Known Allergies  Objective:    Vital Signs:      Afebrile   HR 80s  Mean arterial Pressure 88  Physical Exam: General:  Lying in bed. No resp difficulty HEENT: normal Neck:  supple. JVP 9 . Carotids 2+ bilat; no bruits. No lymphadenopathy or thryomegaly appreciated. Cor: Mechanical heart sounds with LVAD hum present. Subxiphoid large tender fluctuant mass Lungs: clear Abdomen: soft, nontender, nondistended. No hepatosplenomegaly. No bruits or masses. Good bowel sounds. Driveline: C/D/I; securement device intact and driveline incorporated Extremities: no cyanosis, clubbing, rash, edema Neuro: alert & orientedx3, cranial nerves grossly intact. moves all 4 extremities w/o difficulty. Affect pleasant  Telemetry: NSR 80s  Labs: Basic Metabolic Panel: Recent Labs Lab 08/26/15 2155  MG 1.9    BMP Latest Ref Rng 07/31/2015 07/18/2015 07/10/2015  Glucose 65 - 99 mg/dL 114(H) 94 95  BUN 6 - 20 mg/dL 19 24(H) 15  Creatinine 0.61 - 1.24 mg/dL 1.29(H) 1.42(H) 1.34(H)  Sodium 135 - 145 mmol/L 139 139 141  Potassium 3.5 - 5.1 mmol/L 3.7 4.1 4.6  Chloride 101 - 111 mmol/L 104 106 111  CO2 22 - 32 mmol/L 24 26 21(L)  Calcium 8.9 - 10.3 mg/dL 8.7(L) 8.8(L) 8.8(L)    Liver Function Tests: No results for input(s): AST, ALT, ALKPHOS, BILITOT, PROT, ALBUMIN in the last 168 hours. No results for input(s): LIPASE, AMYLASE in the last 168 hours. No results for input(s): AMMONIA in the last 168 hours.  CBC:  Recent Labs Lab 08/26/15 2155  WBC 11.4*  NEUTROABS 9.7*  HGB 8.4*  HCT 25.7*  MCV 62.4*  PLT 331    Cardiac Enzymes: No results for input(s): CKTOTAL, CKMB, CKMBINDEX, TROPONINI in the last 168 hours.  BNP: BNP (last 3 results)  Recent Labs  12/21/14 0309 12/27/14 0047 01/14/15 1046  BNP 347.0* 781.0* 162.9*    ProBNP (last 3 results)  Recent Labs  12/10/14 1214  PROBNP 1807.0*     CBG: No results for input(s): GLUCAP in the last 168 hours.  Coagulation Studies: No results for input(s): LABPROT, INR  in the last 72 hours.  Other results: [id number]  Imaging:  No results found.      Assessment:   1. Driveline abscess 2.  MSSA bacteremia 3. Chronic systolic HF s/p VAD placement 9/16 4. C. Difficile colitis 5. 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 Microcytic anemia 8. Urinary retention  Plan/Discussion:    He has large subxiphoid driveline abscess. Will need drainage and probable pump exchange. Given staph bacteremia will likely need TEE as well to make sure no endocarditis. Will review CT with Dr. Prescott Gum in am. Continue nafcillin for staph and oral vanc for C.diff.   Hold coumadin. Get type and screen. May need FFP.   Given microcytic anemia will check iron stores.   Will pull Foley. Check UA. Start Flomax. Watch for recurrent retention.    I reviewed the LVAD parameters from today, and compared the results to the patient's prior recorded data.  No programming changes were made.  The LVAD is functioning within specified parameters.  The patient performs LVAD self-test daily.  LVAD interrogation was negative for any significant power changes, alarms or PI events/speed drops.  LVAD equipment check completed and is in good working order.  Back-up equipment present.   LVAD education done on emergency procedures and precautions and reviewed exit site care.  Length of Stay:   [name], Daniel,MD 11:06 PM  VAD Team Pager [phone] (7am - 7am) +++VAD ISSUES ONLY+++ Advanced Heart Failure Team Pager [phone] (M-F; 7a - 4p)  Please contact [facility] for night-coverage after hours (4p -7a ) and weekends on [email or web address] for all non- LVAD Issues

## 2015-08-26 NOTE — Progress Notes (Signed)
Pharmacy Antibiotic Note  [name] is a 45 y.o. male admitted on 5/8/2017Pharmacy has been consulted for PO vancomycin dosing. Patient found to be C. Diff + on 5/2. Has been on PO vanc PTA, will plan to continue for now. Also continues on IV nafcillin.  Plan: Start vancomycin 125mg  PO QID Monitor clinical picture F/U LOT     Temp (24hrs), Avg:98.8 F (37.1 C), Min:98.8 F (37.1 C), Max:98.8 F (37.1 C)  No results for input(s): WBC, CREATININE, LATICACIDVEN, VANCOTROUGH, VANCOPEAK, VANCORANDOM, GENTTROUGH, GENTPEAK, GENTRANDOM, TOBRATROUGH, TOBRAPEAK, TOBRARND, AMIKACINPEAK, AMIKACINTROU, AMIKACIN in the last 168 hours.  CrCl cannot be calculated (Unknown ideal weight.).    No Known Allergies  Antimicrobials this admission: Nafcillin PTA >>  Vanc PO 5/8 >>   Microbiology results: C. Diff + on 5/2 (outpatient?)  Thank you for allowing pharmacy to be a part of this patient's care.  Elenor Quinones, PharmD, BCPS Clinical Pharmacist Pager [phone] 08/26/2015 9:13 PM

## 2015-08-26 NOTE — Progress Notes (Signed)
ANTICOAGULATION CONSULT NOTE - Initial Consult  Pharmacy Consult for Heparin Indication: LVAD  Allergies  Allergen Reactions  . Phytonadione Other (See Comments)    Patient has LVAD: please check with LVAD coordinator on call or LVAD MD on call before reversal of anticoagulation with vit k    Patient Measurements: TBW 80 kg IBW 66.1   Vital Signs: Temp: 98.8 F (37.1 C) (05/08 2056) Temp Source: Oral (05/08 2056)  Labs:  Recent Labs  08/26/15 2155  HGB 8.4*  HCT 25.7*  PLT 331  LABPROT 29.2*  INR 2.82*    CrCl cannot be calculated (Unknown ideal weight.).   Medical History: Past Medical History  Diagnosis Date  . Chronic systolic heart failure (HCC)     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%)  . Atrial fibrillation -parosysmal      Rx w amiodarone  . Noncompliance     H/O  MEDICAL NONCOMPLIANCE  . 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 Tricuspid valve regurgitation     SEVERE  . Severe mitral regurgitation   . Polymorphic ventricular tachycardia (HCC)     RECURRENT WITH APPROPRIATE SHOCK THERAPY IN THE PAST  . Ventricular fibrillation (HCC)     WITH APPROPRIATE SHOCK THERAPY IN THE PAST  . Hypertension   . Gout   . RA (rheumatoid arthritis) ([place])   . Automatic implantable cardiac defibrillator -BSX     single chamber  . GI bleed -massive     11 Units 2012  . Elevated LFTs   . H/O hyperthyroidism   . CHF (congestive heart failure) ([place])   . AKI (acute kidney injury) ([place])   . AICD (automatic cardioverter/defibrillator) present     Assessment: 45 yo M presents on 5/8. [name] is a 45 y.o. male admitted on 5/8. Consulted for heparin when INR < 1.8. Holding PTA coumadin. INR currently 2.82. Hgb low at 8.4, plts wnl.  Goal of Therapy:  Heparin level 0.3-0.7 units/ml Monitor platelets by anticoagulation protocol: Yes   Plan:  Start heparin gtt when INR < 1.8 Monitor daily INR, CBC, s/s  of bleed  Elenor Quinones, PharmD, [facility] Clinical Pharmacist Pager [phone] 08/26/2015 10:46 PM

## 2015-08-27 ENCOUNTER — Inpatient Hospital Stay (HOSPITAL_COMMUNITY): Payer: 59 | Admitting: Anesthesiology

## 2015-08-27 ENCOUNTER — Encounter (HOSPITAL_COMMUNITY): Payer: Self-pay | Admitting: Anesthesiology

## 2015-08-27 ENCOUNTER — Encounter (HOSPITAL_COMMUNITY)
Admission: AD | Disposition: A | Discharge status: Rehabilitation Facility | Payer: Self-pay | Source: Ambulatory Visit | Attending: Internal Medicine

## 2015-08-27 ENCOUNTER — Inpatient Hospital Stay (HOSPITAL_COMMUNITY): Payer: 59

## 2015-08-27 DIAGNOSIS — I9789 Other postprocedural complications and disorders of the circulatory system, not elsewhere classified: Secondary | ICD-10-CM

## 2015-08-27 HISTORY — PX: APPLICATION OF WOUND VAC: SHX5189

## 2015-08-27 HISTORY — PX: STERNAL WOUND DEBRIDEMENT: SHX1058

## 2015-08-27 LAB — MRSA PCR SCREENING: MRSA by PCR: NEGATIVE

## 2015-08-27 LAB — PREPARE RBC (CROSSMATCH)

## 2015-08-27 LAB — GRAM STAIN

## 2015-08-27 LAB — BASIC METABOLIC PANEL
Anion gap: 9 (ref 5–15)
BUN: 18 mg/dL (ref 6–20)
CO2: 22 mmol/L (ref 22–32)
Calcium: 8.1 mg/dL — ABNORMAL LOW (ref 8.9–10.3)
Chloride: 108 mmol/L (ref 101–111)
Creatinine, Ser: 1.2 mg/dL (ref 0.61–1.24)
GFR calc Af Amer: >60 mL/min (ref >60)
GFR calc non Af Amer: >60 mL/min (ref >60)
Glucose, Bld: 97 mg/dL (ref 65–99)
Potassium: 3.9 mmol/L (ref 3.5–5.1)
Sodium: 139 mmol/L (ref 135–145)

## 2015-08-27 LAB — HEPATIC FUNCTION PANEL
ALBUMIN: 1.7 g/dL — AB (ref 3.5–5.0)
ALT: 41 U/L (ref 17–63)
AST: 44 U/L — AB (ref 15–41)
Alkaline Phosphatase: 114 U/L (ref 38–126)
Bilirubin, Direct: 0.9 mg/dL — ABNORMAL HIGH (ref 0.1–0.5)
Indirect Bilirubin: 1.1 mg/dL — ABNORMAL HIGH (ref 0.3–0.9)
TOTAL PROTEIN: 6 g/dL — AB (ref 6.5–8.1)
Total Bilirubin: 2 mg/dL — ABNORMAL HIGH (ref 0.3–1.2)

## 2015-08-27 LAB — CBC
HCT: 23.9 % — ABNORMAL LOW (ref 39.0–52.0)
Hemoglobin: 7.8 g/dL — ABNORMAL LOW (ref 13.0–17.0)
MCH: 19.8 pg — ABNORMAL LOW (ref 26.0–34.0)
MCHC: 32.6 g/dL (ref 30.0–36.0)
MCV: 60.8 fL — ABNORMAL LOW (ref 78.0–100.0)
Platelets: 376 10*3/uL (ref 150–400)
RBC: 3.93 MIL/uL — ABNORMAL LOW (ref 4.22–5.81)
RDW: 19.5 % — ABNORMAL HIGH (ref 11.5–15.5)
WBC: 13.3 10*3/uL — ABNORMAL HIGH (ref 4.0–10.5)

## 2015-08-27 LAB — PROTIME-INR
INR: 2.55 — ABNORMAL HIGH (ref 0.00–1.49)
Prothrombin Time: 27.1 seconds — ABNORMAL HIGH (ref 11.6–15.2)

## 2015-08-27 LAB — LACTATE DEHYDROGENASE: LDH: 189 U/L (ref 98–192)

## 2015-08-27 LAB — IRON AND TIBC
Iron: 17 ug/dL — ABNORMAL LOW (ref 45–182)
Saturation Ratios: 9 % — ABNORMAL LOW (ref 17.9–39.5)
TIBC: 186 ug/dL — AB (ref 250–450)
UIBC: 169 ug/dL

## 2015-08-27 SURGERY — DEBRIDEMENT, WOUND, STERNUM
Anesthesia: Monitor Anesthesia Care

## 2015-08-27 MED ORDER — NEOSTIGMINE METHYLSULFATE 5 MG/5ML IV SOSY
PREFILLED_SYRINGE | INTRAVENOUS | Status: AC
  Filled 2015-08-27: qty 5

## 2015-08-27 MED ORDER — PROPOFOL 10 MG/ML IV BOLUS
INTRAVENOUS | Status: AC
  Filled 2015-08-27: qty 20

## 2015-08-27 MED ORDER — MORPHINE SULFATE (PF) 2 MG/ML IV SOLN
INTRAVENOUS | Status: AC
  Filled 2015-08-27: qty 1

## 2015-08-27 MED ORDER — SODIUM CHLORIDE 0.9% FLUSH
10.0000 mL | INTRAVENOUS | Status: DC | PRN
  Administered 2015-08-31: 20 mL
  Administered 2015-09-06 (×2): 10 mL
  Administered 2015-09-07 – 2015-09-18 (×2): 40 mL
  Administered 2015-09-24: 30 mL
  Filled 2015-08-27 (×6): qty 40

## 2015-08-27 MED ORDER — RIFAMPIN 300 MG PO CAPS
300.0000 mg | ORAL_CAPSULE | Freq: Three times a day (TID) | ORAL | Status: DC
  Administered 2015-08-27 – 2015-09-17 (×62): 300 mg via ORAL
  Filled 2015-08-27 (×64): qty 1

## 2015-08-27 MED ORDER — MORPHINE SULFATE (PF) 2 MG/ML IV SOLN
2.0000 mg | Freq: Once | INTRAVENOUS | Status: AC
  Administered 2015-08-27: 2 mg via INTRAVENOUS

## 2015-08-27 MED ORDER — PROPOFOL 500 MG/50ML IV EMUL
INTRAVENOUS | Status: DC | PRN
  Administered 2015-08-27: 50 ug/kg/min via INTRAVENOUS

## 2015-08-27 MED ORDER — FENTANYL CITRATE (PF) 100 MCG/2ML IJ SOLN
INTRAMUSCULAR | Status: DC | PRN
  Administered 2015-08-27 (×2): 50 ug via INTRAVENOUS
  Administered 2015-08-27: 25 ug via INTRAVENOUS

## 2015-08-27 MED ORDER — ROCURONIUM BROMIDE 50 MG/5ML IV SOLN
INTRAVENOUS | Status: AC
  Filled 2015-08-27: qty 1

## 2015-08-27 MED ORDER — SODIUM CHLORIDE 0.9 % IV SOLN: Freq: Once | INTRAVENOUS | Status: AC

## 2015-08-27 MED ORDER — VANCOMYCIN HCL 1000 MG IV SOLR
INTRAVENOUS | Status: DC
  Filled 2015-08-27 (×2): qty 1000

## 2015-08-27 MED ORDER — SODIUM CHLORIDE 0.9 % IR SOLN
Status: DC | PRN
  Administered 2015-08-27: 2000 mL

## 2015-08-27 MED ORDER — DEXTROSE-NACL 5-0.9 % IV SOLN
INTRAVENOUS | Status: DC
  Administered 2015-08-27 – 2015-08-29 (×4): via INTRAVENOUS

## 2015-08-27 MED ORDER — FENTANYL CITRATE (PF) 250 MCG/5ML IJ SOLN
INTRAMUSCULAR | Status: AC
  Filled 2015-08-27: qty 5

## 2015-08-27 MED ORDER — GLYCOPYRROLATE 0.2 MG/ML IV SOSY
PREFILLED_SYRINGE | INTRAVENOUS | Status: AC
  Filled 2015-08-27: qty 3

## 2015-08-27 MED ORDER — SODIUM CHLORIDE 0.9% FLUSH
10.0000 mL | Freq: Two times a day (BID) | INTRAVENOUS | Status: DC
  Administered 2015-08-28 – 2015-08-31 (×6): 10 mL
  Administered 2015-08-31: 20 mL
  Administered 2015-09-01 – 2015-09-06 (×9): 10 mL
  Administered 2015-09-07 – 2015-09-08 (×2): 30 mL
  Administered 2015-09-09 – 2015-09-12 (×7): 10 mL
  Administered 2015-09-13: 40 mL
  Administered 2015-09-13: 10 mL
  Administered 2015-09-14: 30 mL
  Administered 2015-09-14 – 2015-09-19 (×9): 10 mL
  Administered 2015-09-19: 20 mL
  Administered 2015-09-20: 30 mL
  Administered 2015-09-20 – 2015-09-25 (×11): 10 mL

## 2015-08-27 MED ORDER — LACTATED RINGERS IV SOLN
INTRAVENOUS | Status: DC | PRN
  Administered 2015-08-27: 15:00:00 via INTRAVENOUS

## 2015-08-27 MED ORDER — VANCOMYCIN HCL 1000 MG IV SOLR
INTRAVENOUS | Status: DC | PRN
  Administered 2015-08-27: 1000 mL

## 2015-08-27 MED ORDER — MIDAZOLAM HCL 2 MG/2ML IJ SOLN
INTRAMUSCULAR | Status: AC
  Filled 2015-08-27: qty 2

## 2015-08-27 MED ORDER — LIDOCAINE HCL (PF) 1 % IJ SOLN
INTRAMUSCULAR | Status: AC
  Filled 2015-08-27: qty 30

## 2015-08-27 MED ORDER — LIDOCAINE HCL (PF) 1 % IJ SOLN
INTRAMUSCULAR | Status: DC | PRN
  Administered 2015-08-27: 10 mL

## 2015-08-27 MED ORDER — LIDOCAINE HCL (CARDIAC) 20 MG/ML IV SOLN
INTRAVENOUS | Status: DC | PRN
  Administered 2015-08-27: 30 mg via INTRAVENOUS

## 2015-08-27 MED ORDER — VANCOMYCIN 50 MG/ML ORAL SOLUTION
125.0000 mg | Freq: Four times a day (QID) | ORAL | Status: AC
  Administered 2015-08-27 – 2015-09-09 (×53): 125 mg via ORAL
  Filled 2015-08-27 (×56): qty 2.5

## 2015-08-27 MED ORDER — MIDAZOLAM HCL 5 MG/5ML IJ SOLN
INTRAMUSCULAR | Status: DC | PRN
Start: 2015-08-27 — End: 2015-08-27
  Administered 2015-08-27: 2 mg via INTRAVENOUS

## 2015-08-27 MED ORDER — ONDANSETRON HCL 4 MG/2ML IJ SOLN
INTRAMUSCULAR | Status: AC
  Filled 2015-08-27: qty 2

## 2015-08-27 MED ORDER — PROPOFOL 10 MG/ML IV BOLUS
INTRAVENOUS | Status: DC | PRN
Start: 2015-08-27 — End: 2015-08-27
  Administered 2015-08-27: 20 mg via INTRAVENOUS

## 2015-08-27 SURGICAL SUPPLY — 67 items
ATTRACTOMAT 16X20 MAGNETIC DRP (DRAPES) ×3 IMPLANT
BAG DECANTER FOR FLEXI CONT (MISCELLANEOUS) ×3 IMPLANT
BENZOIN TINCTURE PRP APPL 2/3 (GAUZE/BANDAGES/DRESSINGS) IMPLANT
BLADE SURG 10 STRL SS (BLADE) IMPLANT
BLADE SURG 15 STRL LF DISP TIS (BLADE) IMPLANT
BLADE SURG 15 STRL SS (BLADE)
BNDG GAUZE ELAST 4 BULKY (GAUZE/BANDAGES/DRESSINGS) IMPLANT
CANISTER SUCTION 2500CC (MISCELLANEOUS) ×3 IMPLANT
CANISTER WOUND CARE 500ML ATS (WOUND CARE) ×3 IMPLANT
CATH FOLEY 2WAY SLVR  5CC 16FR (CATHETERS)
CATH FOLEY 2WAY SLVR 5CC 16FR (CATHETERS) IMPLANT
CATH THORACIC 28FR RT ANG (CATHETERS) IMPLANT
CATH THORACIC 36FR (CATHETERS) IMPLANT
CATH THORACIC 36FR RT ANG (CATHETERS) IMPLANT
CLIP TI WIDE RED SMALL 24 (CLIP) IMPLANT
CONN Y 3/8X3/8X3/8  BEN (MISCELLANEOUS)
CONN Y 3/8X3/8X3/8 BEN (MISCELLANEOUS) IMPLANT
CONT SPEC 4OZ CLIKSEAL STRL BL (MISCELLANEOUS) IMPLANT
COVER SURGICAL LIGHT HANDLE (MISCELLANEOUS) IMPLANT
DRAPE CARDIOVASCULAR INCISE (DRAPES) ×2
DRAPE LAPAROSCOPIC ABDOMINAL (DRAPES) IMPLANT
DRAPE SLUSH/WARMER DISC (DRAPES) ×3 IMPLANT
DRAPE SRG 135X102X78XABS (DRAPES) ×1 IMPLANT
DRAPE WARM FLUID 44X44 (DRAPE) IMPLANT
DRSG AQUACEL AG ADV 3.5X14 (GAUZE/BANDAGES/DRESSINGS) ×3 IMPLANT
DRSG PAD ABDOMINAL 8X10 ST (GAUZE/BANDAGES/DRESSINGS) IMPLANT
DRSG VAC ATS SM SENSATRAC (GAUZE/BANDAGES/DRESSINGS) ×3 IMPLANT
ELECT REM PT RETURN 9FT ADLT (ELECTROSURGICAL) ×3
ELECTRODE REM PT RTRN 9FT ADLT (ELECTROSURGICAL) ×1 IMPLANT
GAUZE SPONGE 4X4 12PLY STRL (GAUZE/BANDAGES/DRESSINGS) ×3 IMPLANT
GAUZE XEROFORM 5X9 LF (GAUZE/BANDAGES/DRESSINGS) IMPLANT
GLOVE BIO SURGEON STRL SZ7.5 (GLOVE) ×6 IMPLANT
GOWN STRL REUS W/ TWL LRG LVL3 (GOWN DISPOSABLE) ×6 IMPLANT
GOWN STRL REUS W/TWL LRG LVL3 (GOWN DISPOSABLE) ×12
HANDPIECE INTERPULSE COAX TIP (DISPOSABLE) ×4
HEMOSTAT POWDER SURGIFOAM 1G (HEMOSTASIS) IMPLANT
HEMOSTAT SURGICEL 2X14 (HEMOSTASIS) IMPLANT
KIT BASIN OR (CUSTOM PROCEDURE TRAY) ×3 IMPLANT
KIT ROOM TURNOVER OR (KITS) ×3 IMPLANT
KIT SUCTION CATH 14FR (SUCTIONS) IMPLANT
NEEDLE HYPO 25GX1X1/2 BEV (NEEDLE) ×3 IMPLANT
NS IRRIG 1000ML POUR BTL (IV SOLUTION) ×6 IMPLANT
PACK CHEST (CUSTOM PROCEDURE TRAY) ×3 IMPLANT
PAD ARMBOARD 7.5X6 YLW CONV (MISCELLANEOUS) ×6 IMPLANT
SET HNDPC FAN SPRY TIP SCT (DISPOSABLE) ×2 IMPLANT
SOLUTION BETADINE 4OZ (MISCELLANEOUS) IMPLANT
SPONGE LAP 18X18 X RAY DECT (DISPOSABLE) ×3 IMPLANT
STAPLER VISISTAT 35W (STAPLE) IMPLANT
STRAP MONTGOMERY 1.25X11-1/8 (MISCELLANEOUS) IMPLANT
SUT ETHILON 3 0 FSL (SUTURE) IMPLANT
SUT STEEL 6MS V (SUTURE) IMPLANT
SUT STEEL STERNAL CCS#1 18IN (SUTURE) IMPLANT
SUT STEEL SZ 6 DBL 3X14 BALL (SUTURE) IMPLANT
SUT VIC AB 1 CTX 36 (SUTURE) ×2
SUT VIC AB 1 CTX36XBRD ANBCTR (SUTURE) ×1 IMPLANT
SUT VIC AB 2-0 CTX 27 (SUTURE) ×3 IMPLANT
SUT VIC AB 3-0 X1 27 (SUTURE) ×3 IMPLANT
SWAB COLLECTION DEVICE MRSA (MISCELLANEOUS) ×6 IMPLANT
SWAB CULTURE ESWAB REG 1ML (MISCELLANEOUS) ×3 IMPLANT
SYR 5ML LL (SYRINGE) IMPLANT
SYR CONTROL 10ML LL (SYRINGE) ×3 IMPLANT
TOWEL OR 17X24 6PK STRL BLUE (TOWEL DISPOSABLE) ×3 IMPLANT
TOWEL OR 17X26 10 PK STRL BLUE (TOWEL DISPOSABLE) ×3 IMPLANT
TOWEL OR NON WOVEN STRL DISP B (DISPOSABLE) ×3 IMPLANT
TRAY FOLEY CATH 16FRSI W/METER (SET/KITS/TRAYS/PACK) IMPLANT
TUBE ANAEROBIC SPECIMEN COL (MISCELLANEOUS) IMPLANT
WATER STERILE IRR 1000ML POUR (IV SOLUTION) ×3 IMPLANT

## 2015-08-27 NOTE — Brief Op Note (Signed)
08/26/2015 - 08/27/2015  5:36 PM  PATIENT:  [name]  45 y.o. male  PRE-OPERATIVE DIAGNOSIS:  surgical site infection  POST-OPERATIVE DIAGNOSIS:  surgical site infection  PROCEDURE:  Procedure(s): WOUND IRRIGATION AND DEBRIDEMENT (N/A) APPLICATION OF WOUND VAC (N/A)  SURGEON:  Surgeon(s) and Role:    * Ivin Poot, MD - Primary  PHYSICIAN ASSISTANT:none   ASSISTANTS: none   ANESTHESIA:   MAC  EBL:  Total I/O In: [id number] [I.V.:350; Blood:843; IV Piggyback:200] Out: -   BLOOD ADMINISTERED:none  DRAINS: none   LOCAL MEDICATIONS USED:  LIDOCAINE  and Amount: 10 ml  SPECIMEN:  Source of Specimen:  wound and Scraping  DISPOSITION OF SPECIMEN:  microbiology  COUNTS:  YES  TOURNIQUET:  * No tourniquets in log *  DICTATION: .Dragon Dictation  PLAN OF CARE: return to [address]  PATIENT DISPOSITION:  ICU - extubated and stable.   Delay start of Pharmacological VTE agent (>24hrs) due to surgical blood loss or risk of bleeding: yes

## 2015-08-27 NOTE — Progress Notes (Signed)
HeartMate 2 Rounding Note  Subjective:    Admitted from [facility] in [place] with LVAD complication. Blood cultures --> MSSA and has C diff. Remains on Nafcillin  And oral vancomycin.   Today abscess mid epigastric area opened with copious purulence. Complaining of abdominal pain.   UA negative.   INR 2.55   LVAD INTERROGATION:  HeartMate II LVAD:  Flow 5 liters/min, speed 9200, power 5, PI 7.8 .   Objective:    Vital Signs:   Temp:  [98 F (36.7 C)-99.6 F (37.6 C)] 99.6 F (37.6 C) (05/09 0400) Pulse Rate:  [64-92] 77 (05/09 0600) Resp:  [17-25] 21 (05/09 0600) SpO2:  [99 %-100 %] 100 % (05/09 0600) Weight:  [181 lb 3.5 oz (82.2 kg)-185 lb 10 oz (84.2 kg)] 185 lb 10 oz (84.2 kg) (05/09 0500) Last BM Date: 08/26/15 Mean arterial Pressure 80s   Intake/Output:   Intake/Output Summary (Last 24 hours) at 08/27/15 0722 Last data filed at 08/27/15 0600  Gross per 24 hour  Intake    380 ml  Output    300 ml  Net     80 ml     Physical Exam: General:  No resp difficulty.  HEENT: normal Neck: supple. JVP 5-6. Carotids 2+ bilat; no bruits. No lymphadenopathy or thryomegaly appreciated. Cor: Mechanical heart sounds with LVAD hum present. Lungs: clear Abdomen: soft, tender, nondistended. No hepatosplenomegaly. No bruits or masses. Good bowel sounds. Mid epigastric abscess with copious purulence.  Driveline: C/D/I; securement device intact and driveline incorporated Extremities: no cyanosis, clubbing, rash, edema Neuro: alert & orientedx3, cranial nerves grossly intact. moves all 4 extremities w/o difficulty. Affect pleasant  Telemetry: NSR 70s   Labs: Basic Metabolic Panel:  Recent Labs Lab 08/26/15 2155 08/27/15 0410  NA  --  139  K  --  3.9  CL  --  108  CO2  --  22  GLUCOSE  --  97  BUN  --  18  CREATININE  --  1.20  CALCIUM  --  8.1*  MG 1.9  --     Liver Function Tests: No results for input(s): AST, ALT, ALKPHOS, BILITOT, PROT,  ALBUMIN in the last 168 hours. No results for input(s): LIPASE, AMYLASE in the last 168 hours. No results for input(s): AMMONIA in the last 168 hours.  CBC:  Recent Labs Lab 08/26/15 2155 08/27/15 0410  WBC 11.4* 13.3*  NEUTROABS 9.7*  --   HGB 8.4* 7.8*  HCT 25.7* 23.9*  MCV 62.4* 60.8*  PLT 331 376    INR:  Recent Labs Lab 08/26/15 2155 08/27/15 0410  INR 2.82* 2.55*    Other results:    Imaging: Dg Chest Port 1 View  08/26/2015  CLINICAL DATA:  Initial evaluation for left ventricular assist device complication. EXAM: PORTABLE CHEST 1 VIEW COMPARISON:  Prior radiograph from 01/21/2015. FINDINGS: Left-sided transvenous pacemaker/AICD he is stable. LVAD device partially visualize, grossly stable position relative to most recent radiograph. Cardiomegaly is unchanged. Mild elevation left hemidiaphragm, similar to prior. With no pulmonary edema or pleural effusion. No consolidative airspace disease. Triangular opacity at the lingula of likely reflects atelectasis/scar. No pneumothorax. Osseous structures unchanged. IMPRESSION: 1. Mild subsegmental atelectasis/scarring at the lingula. No other active cardiopulmonary disease. 2. Grossly similar position and alignment of partially visualized LVAD device. 3. Stable cardiomegaly without pulmonary edema. No pleural effusion. Electronically Signed   By: Jeannine Boga M.D.   On: 08/26/2015 21:26  Medications:     Scheduled Medications: . allopurinol  300 mg Oral Daily  . amiodarone  100 mg Oral Daily  . citalopram  20 mg Oral Daily  . digoxin  0.125 mg Oral Daily  . docusate sodium  200 mg Oral Daily  . ferrous sulfate  325 mg Oral BID WC  . hydrALAZINE  100 mg Oral Q8H  . nafcillin IV  2 g Intravenous Q4H  . pantoprazole  40 mg Oral Daily  . pregabalin  75 mg Oral BID  . sildenafil  20 mg Oral TID  . sodium chloride flush  3 mL Intravenous Q12H  . spironolactone  12.5 mg Oral Daily  . tamsulosin  0.4 mg Oral  Daily  . vancomycin  125 mg Oral Q6H     Infusions: . dextrose 5 % and 0.9% NaCl 70 mL/hr at 08/27/15 0615     PRN Medications:  sodium chloride, bisacodyl **OR** bisacodyl, ondansetron, sodium chloride flush, traMADol, zolpidem   Assessment:  1. LVAD Complication- Driveline abscess 2. MSSA bacteremia 3. Chronic systolic HF s/p VAD placement 9/16 4. C. Difficile colitis 5. 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 Microcytic anemia 8. Urinary retention   Plan/Discussion:    LVAD complication with open abscess with copious purulence. Dr Darcey Nora at bed side. Deep cultures obtained. Stat gram stain. Start IV vanc+ naficillin + zosyn. ID consults. Plan to go to OR today for I&D.   From HF perspective he is stable.   Give 1unit PRBCs now.   I reviewed the LVAD parameters from today, and compared the results to the patient's prior recorded data.  No programming changes were made.  The LVAD is functioning within specified parameters.  The patient performs LVAD self-test daily.  LVAD interrogation was negative for any significant power changes, alarms or PI events/speed drops.  LVAD equipment check completed and is in good working order.  Back-up equipment present.   LVAD education done on emergency procedures and precautions and reviewed exit site care.  Length of Stay: 1  Amy Clegg NP-C 08/27/2015, 7:22 AM  VAD Team --- VAD ISSUES ONLY--- Pager [phone] (7am - 7am)  Advanced Heart Failure Team  Pager [phone] (M-F; 7a - 4p)  Please contact [facility] for night-coverage after hours (4p -7a ) and weekends on [email or web address]   Patient seen and examined with Darrick Grinder, NP. We discussed all aspects of the encounter. I agree with the assessment and plan as stated above.   He has had external rupture of his abscess today. I have discussed with Dr. Prescott Gum and there is clear communication with LVAD and pocket and outflow graft. Will give FFP and RBCs  and he will go to OR today for initial washout. ID seeing currently to help guide abx therapy.   I have spoken with Transplant team at [facility] and their practice pattern has been to treat the infection and they proceed with long-term suppression (no pump exchange) however this would remove him from transplant consideration. Will d/w [facility] later today as well.   Will need TEE to assess for vegetations on native valves and ICD as well.   Continue oral vanc for c.diff.   LVAD parameters stable.   The patient is critically ill with multiple organ systems failure and requires high complexity decision making for assessment and support, frequent evaluation and titration of therapies, application of advanced monitoring technologies and extensive interpretation of multiple databases.   Critical Care Time  devoted to patient care services described in this note is 45 Minutes.  Jesika Men,MD 8:47 AM

## 2015-08-27 NOTE — Progress Notes (Signed)
08/27/2015 1:42 PM Nursing note Pt. Assisted to side of bed to urinate. Noted blood stain on bed pad. Pt. Suspects this may have happened last pm after foley catheter removal. No apparent bleeding from urethra noted at this time. Pt. Able to pass urine without complications. Pt. Denies pain or difficulty passing urine. Will continue to closely monitor patient.  Britley Gashi, [name]

## 2015-08-27 NOTE — Progress Notes (Signed)
  Subjective: patient returns after HM 2 implant sept 2016 with pump infection draining out from lower sternal  Incision  No septecemia INR 2.6, WBC 13k, Hb 7.8 Plan removal of pump thru sternotomy after INR normalized - thurs Will need pump explant and support with C-mag until pump pocket cleans up then new HM-2  Objective: Vital signs in last 24 hours: Temp:  [98 F (36.7 C)-99.6 F (37.6 C)] 99.6 F (37.6 C) (05/09 0400) Pulse Rate:  [64-92] 77 (05/09 0600) Cardiac Rhythm:  [-] Normal sinus rhythm (05/09 0400) Resp:  [17-25] 21 (05/09 0600) SpO2:  [99 %-100 %] 100 % (05/09 0600) Weight:  [181 lb 3.5 oz (82.2 kg)-185 lb 10 oz (84.2 kg)] 185 lb 10 oz (84.2 kg) (05/09 0500)  Hemodynamic parameters for last 24 hours:  stable  Intake/Output from previous day: 05/08 0701 - 05/09 0700 In: 380 [I.V.:80; IV Piggyback:300] Out: 300 [Urine:300] Intake/Output this shift:    Open pocket with pus at lower sternal border I can feel the outflow connection and power cord connection at base of wound  Lab Results:  Recent Labs  08/26/15 2155 08/27/15 0410  WBC 11.4* 13.3*  HGB 8.4* 7.8*  HCT 25.7* 23.9*  PLT 331 376   BMET:  Recent Labs  08/27/15 0410  NA 139  K 3.9  CL 108  CO2 22  GLUCOSE 97  BUN 18  CREATININE 1.20  CALCIUM 8.1*    PT/INR:  Recent Labs  08/27/15 0410  LABPROT 27.1*  INR 2.55*   ABG    Component Value Date/Time   PHART 7.386 12/23/2014 0335   HCO3 24.3* 12/23/2014 0335   TCO2 22 01/01/2015 1610   ACIDBASEDEF 0.3 12/23/2014 0335   O2SAT 52.9 01/28/2015 1030   CBG (last 3)   Recent Labs  08/26/15 2047  GLUCAP 77    Assessment/Plan: S/P    HM-2 VAD  Irrigate wound and place VAC today Explant pump in 48 hrs after FFP - will need heparin bridge   LOS: 1 day    Tharon Aquas Trigt III 08/27/2015

## 2015-08-27 NOTE — Transfer of Care (Signed)
Immediate Anesthesia Transfer of Care Note  Patient: [name]  Procedure(s) Performed: Procedure(s): WOUND IRRIGATION AND DEBRIDEMENT (N/A) APPLICATION OF WOUND VAC (N/A)  Patient Location: SICU  Anesthesia Type:MAC  Level of Consciousness: awake, alert , oriented and patient cooperative  Airway & Oxygen Therapy: Patient Spontanous Breathing and Patient connected to face mask oxygen  Post-op Assessment: Report given to RN and Post -op Vital signs reviewed and stable  Post vital signs: Reviewed and stable  Last Vitals:  Filed Vitals:   08/27/15 1555 08/27/15 1600  BP:    Pulse:  66  Temp: 36.6 C   Resp:  18    Last Pain:  Filed Vitals:   08/27/15 1745  PainSc: 0-No pain      Patients Stated Pain Goal: 5 ([id number])  Complications: No apparent anesthesia complications

## 2015-08-27 NOTE — Progress Notes (Signed)
08/27/2015 11:36 AM Noted post PICC line placement, only double lumen PICC in place. Orders were for triple lumen. IV therapy contacted this am at 0830 and made aware of need for STAT triple lumen PICC. Dr. Haroldine Laws paged and made aware. Orders received for PICC line change out. Orders enacted and IV therapy made aware. Verbal orders also received ok to leave left midline in place and utilize LLFA for doppler MAP. Orders enacted. Pt. Updated on plan of care. Will continue to closely monitor patient.  Jhoel Stieg, [name]

## 2015-08-27 NOTE — Progress Notes (Signed)
Peripherally Inserted Central Catheter/Midline Placement  The IV Nurse has discussed with the patient and/or persons authorized to consent for the patient, the purpose of this procedure and the potential benefits and risks involved with this procedure.  The benefits include less needle sticks, lab draws from the catheter and patient may be discharged home with the catheter.  Risks include, but not limited to, infection, bleeding, blood clot (thrombus formation), and puncture of an artery; nerve damage and irregular heat beat.  Alternatives to this procedure were also discussed.  PICC/Midline Placement Documentation  PICC / Midline Double Lumen [id number] PICC Right Basilic 41 cm 0 cm (Active)       Holley Bouche Renee 08/27/2015, 11:29 AM

## 2015-08-27 NOTE — Progress Notes (Signed)
08/27/2015 3:49 PM Nursing note Confirmed with Dr. Prescott Gum do not transfuse the two additional PRBC ordered by CRNA. On hold for OR. Orders enacted. Will continue to closely monitor patient.  [name], [name]

## 2015-08-27 NOTE — Progress Notes (Signed)
08/27/2015 1030 Amy Clegg NP contacted regarding pt. MAP 100-110 this am. Hydralazine was held this morning. Verbal orders to administer at this time. Orders enacted. Pt. Updated on plan of care. Will continue to closely monitor patient.  Newton Frutiger, [name]

## 2015-08-27 NOTE — Consult Note (Addendum)
[place] for Infectious Disease  Date of Admission:  08/26/2015  Date of Consult:  08/27/2015  Reason for Consult: Abscess, drive line infection Referring Physician: [name]  Impression/Recommendation Abscess Drive lin infection AICD MSSA bacteremia (I spoke with [facility] lab and verified this) C diff  Will  Rifampin- hold since will interact with dig and amio and sildenafil and celexa Check LFTs Continue po vanco Continue IV nafcillin Await wound Cx Repeat BCx TEE  Comment- His diarrhea has improved Will d/c zosyn when Cx dictate.  Will need TEE to assure his ICD in light of his bacteremia Agree that his best overall chance for clearance is removal of hardware as he can tolerate. If this is not possible, will most likely need lifelong suppressive therapy.   Hep B/C negative at outside hospital.  Thank you so much for this interesting consult,   [name] (pager) [phone] [email or web address]  [name] is an 45 y.o. male.  HPI: 44 yo M with hx of LVAD and AICD placed 12-2014 due to CHF (and previous sudden death) .  On 08/23/22 he began to develop fever, abd, pain and hematuria while visiting family in [place]. He had no complaints regarding his drive line on adm. He had  WBC of 21.5 and a CT abd which was suggestive of pyelo with a small stone.  His course was further complicated by MSSA bacteremia as well as C diff. He was started on po and IV vanco (then changed to Nafcillin). There was also a question of GI bleed due to dropping hgb.  He had a TTE that did not show vegetation, a TEE was not done.   His course was further complicated by subxyphoid swelling and a CT scan which revealed a large abscess surrounding his drive line.   He was followed by ID (Dr Darius Bump [phone]).  He was transferred here yesterday for further care.  On eval by NP, he had significant d/c from his drive line site. Zosyn added.   Past Medical History  Diagnosis  Date  . Chronic systolic heart failure (HCC)     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%)  . Atrial fibrillation -parosysmal      Rx w amiodarone  . Noncompliance     H/O  MEDICAL NONCOMPLIANCE  . 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 Tricuspid valve regurgitation     SEVERE  . Severe mitral regurgitation   . Polymorphic ventricular tachycardia (HCC)     RECURRENT WITH APPROPRIATE SHOCK THERAPY IN THE PAST  . Ventricular fibrillation (HCC)     WITH APPROPRIATE SHOCK THERAPY IN THE PAST  . Hypertension   . Gout   . RA (rheumatoid arthritis) ([place])   . Automatic implantable cardiac defibrillator -BSX     single chamber  . GI bleed -massive     11 Units 2012  . Elevated LFTs   . H/O hyperthyroidism   . CHF (congestive heart failure) ([place])   . AKI (acute kidney injury) ([place])   . AICD (automatic cardioverter/defibrillator) present     Past Surgical History  Procedure Laterality Date  . Cardiac catheterization  06/2006    RIGHT HEART CATH SHOWING SEVERE BIVENTRICUALR CHF WITH MARKED FILLING AND PRESSURES  . Insert / replace / remove pacemaker      GUIDANT HE ICD MODEL 2180, SERIAL # [id number]  . Cholecystectomy    . Implantable cardioverter defibrillator generator change N/A 07/01/2011  Procedure: IMPLANTABLE CARDIOVERTER DEFIBRILLATOR GENERATOR CHANGE;  Surgeon: Deboraha Sprang, MD;  Location: [facility] CATH LAB;  Service: Cardiovascular;  Laterality: N/A;  . Tee without cardioversion N/A 12/12/2014    Procedure: TRANSESOPHAGEAL ECHOCARDIOGRAM (TEE);  Surgeon: Jerline Pain, MD;  Location: [place];  Service: Cardiovascular;  Laterality: N/A;  . Cardiac catheterization N/A 12/14/2014    Procedure: Right Heart Cath;  Surgeon: Jolaine Artist, MD;  Location: [place] CV LAB;  Service: Cardiovascular;  Laterality: N/A;  . Cardiac catheterization N/A 12/14/2014    Procedure: IABP Insertion;  Surgeon: Jolaine Artist, MD;  Location:  [place] CV LAB;  Service: Cardiovascular;  Laterality: N/A;  . Insertion of implantable left ventricular assist device N/A 12/20/2014    Procedure: INSERTION OF IMPLANTABLE LEFT VENTRICULAR ASSIST DEVICE;  Surgeon: Ivin Poot, MD;  Location: [place];  Service: Open Heart Surgery;  Laterality: N/A;  CIRC ARREST  NITRIC OXIDE  . Tee without cardioversion N/A 12/20/2014    Procedure: TRANSESOPHAGEAL ECHOCARDIOGRAM (TEE);  Surgeon: Ivin Poot, MD;  Location: [place];  Service: Open Heart Surgery;  Laterality: N/A;     Allergies  Allergen Reactions  . Phytonadione Other (See Comments)    Patient has LVAD: please check with LVAD coordinator on call or LVAD MD on call before reversal of anticoagulation with vit k    Medications:  Scheduled: . sodium chloride   Intravenous Once  . sodium chloride   Intravenous Once  . allopurinol  300 mg Oral Daily  . amiodarone  100 mg Oral Daily  . citalopram  20 mg Oral Daily  . digoxin  0.125 mg Oral Daily  . docusate sodium  200 mg Oral Daily  . ferrous sulfate  325 mg Oral BID WC  . hydrALAZINE  100 mg Oral Q8H  . nafcillin IV  2 g Intravenous Q4H  . pantoprazole  40 mg Oral Daily  . pregabalin  75 mg Oral BID  . sildenafil  20 mg Oral TID  . sodium chloride flush  3 mL Intravenous Q12H  . spironolactone  12.5 mg Oral Daily  . tamsulosin  0.4 mg Oral Daily    Abtx:  Anti-infectives    Start     Dose/Rate Route Frequency Ordered Stop   08/27/15 2300  vancomycin (VANCOCIN) 50 mg/mL oral solution 125 mg  Status:  Discontinued     125 mg Oral Every 6 hours 08/26/15 2200 08/27/15 0807   08/26/15 2145  nafcillin 2 g in dextrose 5 % 100 mL IVPB     2 g 200 mL/hr over 30 Minutes Intravenous Every 4 hours 08/26/15 2104        Total days of antibiotics: restart day count yesterday (naf, po vanco, zosyn today)          Social History:  reports that he has never smoked. He has never used smokeless tobacco. He reports that he does not drink  alcohol or use illicit drugs.  Family History  Problem Relation Age of Onset  . Heart failure Brother     General ROS: no f/c, no thrush. no sob, no cough. + upper abd pain. wound opened this am. 2 loose BM.day. no dysuria. see HPI.   Pulse 77, temperature 98.7 F (37.1 C), temperature source Oral, resp. rate 21, height _0  (1.702 m), weight 84.2 kg (185 lb 10 oz), SpO2 100 %. General appearance: alert, cooperative and no distress Eyes: negative findings: conjunctivae and sclerae normal and pupils equal,  round, reactive to light and accomodation Throat: normal findings: oropharynx pink & moist without lesions or evidence of thrush and mild changes on tongue, more consistent with stress.  Neck: no adenopathy and supple, symmetrical, trachea midline Lungs: clear to auscultation bilaterally Heart: regular rate and rhythm Abdomen: abnormal findings:  distended and bowel sounds normal. upper abd wound packed. tender. RMQ drive line site is clean,  non-tender, no d/c.  Extremities: edema none Pulses: 2+ dorsalis pedis pulses bilaterally.    Results for orders placed or performed during the hospital encounter of 08/26/15 (from the past 48 hour(s))  Glucose, capillary     Status: None   Collection Time: 08/26/15  8:47 PM  Result Value Ref Range   Glucose-Capillary 77 65 - 99 mg/dL   Comment 1 Notify RN    Comment 2 Document in Chart   Protime-INR     Status: Abnormal   Collection Time: 08/26/15  9:55 PM  Result Value Ref Range   Prothrombin Time 29.2 (H) 11.6 - 15.2 seconds   INR 2.82 (H) 0.00 - 1.49  Lactate dehydrogenase     Status: None   Collection Time: 08/26/15  9:55 PM  Result Value Ref Range   LDH 186 98 - 192 U/L  CBC WITH DIFFERENTIAL     Status: Abnormal   Collection Time: 08/26/15  9:55 PM  Result Value Ref Range   WBC 11.4 (H) 4.0 - 10.5 K/uL   RBC 4.12 (L) 4.22 - 5.81 MIL/uL   Hemoglobin 8.4 (L) 13.0 - 17.0 g/dL   HCT 25.7 (L) 39.0 - 52.0 %   MCV 62.4 (L) 78.0 -  100.0 fL   MCH 20.4 (L) 26.0 - 34.0 pg   MCHC 32.7 30.0 - 36.0 g/dL   RDW 20.0 (H) 11.5 - 15.5 %   Platelets 331 150 - 400 K/uL   Neutrophils Relative % 85 %   Lymphocytes Relative 9 %   Monocytes Relative 6 %   Eosinophils Relative 0 %   Basophils Relative 0 %   Neutro Abs 9.7 (H) 1.7 - 7.7 K/uL   Lymphs Abs 1.0 0.7 - 4.0 K/uL   Monocytes Absolute 0.7 0.1 - 1.0 K/uL   Eosinophils Absolute 0.0 0.0 - 0.7 K/uL   Basophils Absolute 0.0 0.0 - 0.1 K/uL   RBC Morphology POLYCHROMASIA PRESENT     Comment: TARGET CELLS ELLIPTOCYTES    Smear Review LARGE PLATELETS PRESENT   Magnesium     Status: None   Collection Time: 08/26/15  9:55 PM  Result Value Ref Range   Magnesium 1.9 1.7 - 2.4 mg/dL  Urinalysis, Routine w reflex microscopic (not at [facility])     Status: Abnormal   Collection Time: 08/26/15 10:00 PM  Result Value Ref Range   Color, Urine AMBER (A) YELLOW    Comment: BIOCHEMICALS MAY BE AFFECTED BY COLOR   APPearance CLEAR CLEAR   Specific Gravity, Urine 1.026 1.005 - 1.030   pH 6.5 5.0 - 8.0   Glucose, UA NEGATIVE NEGATIVE mg/dL   Hgb urine dipstick NEGATIVE NEGATIVE   Bilirubin Urine NEGATIVE NEGATIVE   Ketones, ur 15 (A) NEGATIVE mg/dL   Protein, ur 30 (A) NEGATIVE mg/dL   Nitrite NEGATIVE NEGATIVE   Leukocytes, UA NEGATIVE NEGATIVE  MRSA PCR Screening     Status: None   Collection Time: 08/26/15 10:00 PM  Result Value Ref Range   MRSA by PCR NEGATIVE NEGATIVE    Comment:        The  GeneXpert MRSA Assay (FDA approved for NASAL specimens only), is one component of a comprehensive MRSA colonization surveillance program. It is not intended to diagnose MRSA infection nor to guide or monitor treatment for MRSA infections.   Urine microscopic-add on     Status: Abnormal   Collection Time: 08/26/15 10:00 PM  Result Value Ref Range   Squamous Epithelial / LPF 6-30 (A) NONE SEEN   WBC, UA 0-5 0 - 5 WBC/hpf   RBC / HPF 6-30 0 - 5 RBC/hpf   Bacteria, UA FEW (A) NONE SEEN    Urine-Other MUCOUS PRESENT   Lactate dehydrogenase     Status: None   Collection Time: 08/27/15  4:10 AM  Result Value Ref Range   LDH 189 98 - 192 U/L  Protime-INR     Status: Abnormal   Collection Time: 08/27/15  4:10 AM  Result Value Ref Range   Prothrombin Time 27.1 (H) 11.6 - 15.2 seconds   INR 2.55 (H) 0.00 - 5.42  Basic metabolic panel     Status: Abnormal   Collection Time: 08/27/15  4:10 AM  Result Value Ref Range   Sodium 139 135 - 145 mmol/L   Potassium 3.9 3.5 - 5.1 mmol/L   Chloride 108 101 - 111 mmol/L   CO2 22 22 - 32 mmol/L   Glucose, Bld 97 65 - 99 mg/dL   BUN 18 6 - 20 mg/dL   Creatinine, Ser 1.20 0.61 - 1.24 mg/dL   Calcium 8.1 (L) 8.9 - 10.3 mg/dL   GFR calc non Af Amer >60 >60 mL/min   GFR calc Af Amer >60 >60 mL/min    Comment: (NOTE) The eGFR has been calculated using the CKD EPI equation. This calculation has not been validated in all clinical situations. eGFR's persistently <60 mL/min signify possible Chronic Kidney Disease.    Anion gap 9 5 - 15  CBC     Status: Abnormal   Collection Time: 08/27/15  4:10 AM  Result Value Ref Range   WBC 13.3 (H) 4.0 - 10.5 K/uL   RBC 3.93 (L) 4.22 - 5.81 MIL/uL   Hemoglobin 7.8 (L) 13.0 - 17.0 g/dL   HCT 23.9 (L) 39.0 - 52.0 %   MCV 60.8 (L) 78.0 - 100.0 fL   MCH 19.8 (L) 26.0 - 34.0 pg   MCHC 32.6 30.0 - 36.0 g/dL   RDW 19.5 (H) 11.5 - 15.5 %   Platelets 376 150 - 400 K/uL  Iron and TIBC     Status: Abnormal   Collection Time: 08/27/15  4:10 AM  Result Value Ref Range   Iron 17 (L) 45 - 182 ug/dL   TIBC 186 (L) 250 - 450 ug/dL   Saturation Ratios 9 (L) 17.9 - 39.5 %   UIBC 169 ug/dL  Type and screen [place]     Status: None (Preliminary result)   Collection Time: 08/27/15  4:10 AM  Result Value Ref Range   ABO/RH(D) O POS    Antibody Screen PENDING    Sample Expiration 08/30/2015   Prepare RBC     Status: None   Collection Time: 08/27/15  8:09 AM  Result Value Ref Range    Order Confirmation ORDER PROCESSED BY BLOOD BANK       Component Value Date/Time   SDES URINE, CLEAN CATCH 11/30/2014 1532   SPECREQUEST NONE 11/30/2014 1532   CULT NO GROWTH 1 DAY 11/30/2014 1532   REPTSTATUS 12/01/2014 FINAL 11/30/2014 1532  Dg Chest Port 1 View  08/26/2015  CLINICAL DATA:  Initial evaluation for left ventricular assist device complication. EXAM: PORTABLE CHEST 1 VIEW COMPARISON:  Prior radiograph from 01/21/2015. FINDINGS: Left-sided transvenous pacemaker/AICD he is stable. LVAD device partially visualize, grossly stable position relative to most recent radiograph. Cardiomegaly is unchanged. Mild elevation left hemidiaphragm, similar to prior. With no pulmonary edema or pleural effusion. No consolidative airspace disease. Triangular opacity at the lingula of likely reflects atelectasis/scar. No pneumothorax. Osseous structures unchanged. IMPRESSION: 1. Mild subsegmental atelectasis/scarring at the lingula. No other active cardiopulmonary disease. 2. Grossly similar position and alignment of partially visualized LVAD device. 3. Stable cardiomegaly without pulmonary edema. No pleural effusion. Electronically Signed   By: Jeannine Boga M.D.   On: 08/26/2015 21:26   Recent Results (from the past 240 hour(s))  MRSA PCR Screening     Status: None   Collection Time: 08/26/15 10:00 PM  Result Value Ref Range Status   MRSA by PCR NEGATIVE NEGATIVE Final    Comment:        The GeneXpert MRSA Assay (FDA approved for NASAL specimens only), is one component of a comprehensive MRSA colonization surveillance program. It is not intended to diagnose MRSA infection nor to guide or monitor treatment for MRSA infections.       08/27/2015, 8:33 AM     LOS: 1 day    Records and images were personally reviewed where available.

## 2015-08-28 ENCOUNTER — Encounter (HOSPITAL_COMMUNITY): Payer: Self-pay | Admitting: Cardiothoracic Surgery

## 2015-08-28 ENCOUNTER — Inpatient Hospital Stay (HOSPITAL_COMMUNITY): Payer: 59

## 2015-08-28 DIAGNOSIS — L02211 Cutaneous abscess of abdominal wall: Secondary | ICD-10-CM

## 2015-08-28 DIAGNOSIS — Z95811 Presence of heart assist device: Secondary | ICD-10-CM | POA: Insufficient documentation

## 2015-08-28 DIAGNOSIS — I5023 Acute on chronic systolic (congestive) heart failure: Secondary | ICD-10-CM

## 2015-08-28 LAB — CBC
HCT: 23.9 % — ABNORMAL LOW (ref 39.0–52.0)
HEMATOCRIT: 28 % — AB (ref 39.0–52.0)
HEMOGLOBIN: 9.5 g/dL — AB (ref 13.0–17.0)
Hemoglobin: 8 g/dL — ABNORMAL LOW (ref 13.0–17.0)
MCH: 21.4 pg — ABNORMAL LOW (ref 26.0–34.0)
MCH: 21.7 pg — ABNORMAL LOW (ref 26.0–34.0)
MCHC: 33.5 g/dL (ref 30.0–36.0)
MCHC: 33.9 g/dL (ref 30.0–36.0)
MCV: 64.1 fL — ABNORMAL LOW (ref 78.0–100.0)
MCV: 64.1 fL — ABNORMAL LOW (ref 78.0–100.0)
Platelets: 264 10*3/uL (ref 150–400)
Platelets: 295 10*3/uL (ref 150–400)
RBC: 3.73 MIL/uL — ABNORMAL LOW (ref 4.22–5.81)
RBC: 4.37 MIL/uL (ref 4.22–5.81)
RDW: 21.3 % — ABNORMAL HIGH (ref 11.5–15.5)
RDW: 23 % — ABNORMAL HIGH (ref 11.5–15.5)
WBC: 8.1 10*3/uL (ref 4.0–10.5)
WBC: 11.2 10*3/uL — AB (ref 4.0–10.5)

## 2015-08-28 LAB — PREPARE FRESH FROZEN PLASMA
UNIT DIVISION: 0
Unit division: 0

## 2015-08-28 LAB — BASIC METABOLIC PANEL
Anion gap: 11 (ref 5–15)
BUN: 14 mg/dL (ref 6–20)
CO2: 23 mmol/L (ref 22–32)
Calcium: 8.1 mg/dL — ABNORMAL LOW (ref 8.9–10.3)
Chloride: 107 mmol/L (ref 101–111)
Creatinine, Ser: 1.16 mg/dL (ref 0.61–1.24)
GFR calc Af Amer: >60 mL/min (ref >60)
GFR calc non Af Amer: >60 mL/min (ref >60)
Glucose, Bld: 83 mg/dL (ref 65–99)
Potassium: 3.4 mmol/L — ABNORMAL LOW (ref 3.5–5.1)
Sodium: 141 mmol/L (ref 135–145)

## 2015-08-28 LAB — LACTATE DEHYDROGENASE: LDH: 188 U/L (ref 98–192)

## 2015-08-28 LAB — PROTIME-INR
INR: 1.54 — ABNORMAL HIGH (ref 0.00–1.49)
Prothrombin Time: 18.5 seconds — ABNORMAL HIGH (ref 11.6–15.2)

## 2015-08-28 LAB — HEPARIN LEVEL (UNFRACTIONATED)

## 2015-08-28 LAB — PREPARE RBC (CROSSMATCH)

## 2015-08-28 MED ORDER — POTASSIUM CHLORIDE CRYS ER 20 MEQ PO TBCR
40.0000 meq | EXTENDED_RELEASE_TABLET | Freq: Once | ORAL | Status: AC
  Administered 2015-08-28: 40 meq via ORAL
  Filled 2015-08-28: qty 2

## 2015-08-28 MED ORDER — SODIUM CHLORIDE 0.9 % IV SOLN
Freq: Once | INTRAVENOUS | Status: AC
Start: 2015-08-28 — End: 2015-08-28
  Administered 2015-08-28: 10:00:00 via INTRAVENOUS

## 2015-08-28 MED ORDER — FUROSEMIDE 10 MG/ML IJ SOLN
40.0000 mg | Freq: Once | INTRAMUSCULAR | Status: AC
  Administered 2015-08-28: 40 mg via INTRAVENOUS
  Filled 2015-08-28: qty 4

## 2015-08-28 MED ORDER — HEPARIN (PORCINE) IN NACL 100-0.45 UNIT/ML-% IJ SOLN
1500.0000 [IU]/h | INTRAMUSCULAR | Status: AC
  Administered 2015-08-28: 1000 [IU]/h via INTRAVENOUS
  Administered 2015-08-29: 1500 [IU]/h via INTRAVENOUS
  Filled 2015-08-28 (×2): qty 250

## 2015-08-28 MED ORDER — SODIUM CHLORIDE 0.9 % IV SOLN: Freq: Once | INTRAVENOUS | Status: AC

## 2015-08-28 MED ORDER — HEPARIN BOLUS VIA INFUSION
2000.0000 [IU] | Freq: Once | INTRAVENOUS | Status: AC
  Administered 2015-08-28: 2000 [IU] via INTRAVENOUS
  Filled 2015-08-28: qty 2000

## 2015-08-28 MED ORDER — SPIRONOLACTONE 25 MG PO TABS
25.0000 mg | ORAL_TABLET | Freq: Every day | ORAL | Status: DC
  Administered 2015-08-28 – 2015-09-01 (×4): 25 mg via ORAL
  Filled 2015-08-28 (×4): qty 1

## 2015-08-28 MED ORDER — LOSARTAN POTASSIUM 25 MG PO TABS
25.0000 mg | ORAL_TABLET | Freq: Every day | ORAL | Status: DC
  Administered 2015-08-28 – 2015-08-30 (×2): 25 mg via ORAL
  Filled 2015-08-28 (×2): qty 1

## 2015-08-28 NOTE — Progress Notes (Signed)
VAD Coordinator Procedure Note:   Patient underwent wound I&D and application of wound VAC per Dr. Darcey Nora. Hemodynamics and VAD parameters monitored by anesthesia and myself throughout the procedure. MAPs were obtained with automatic BP cuff and correlated with doppler MAP.       Doppler Auto cuff(MAP): HR: Sat: Flow: PI: Power:     Speed:               Pre-procedure: 4:45 pm 94  120/80 (99)  77 100 5.3 7.0 5.3      9200       Post anesthesia: 5:00    110/77 (92)  71 100 5.1 6.3 5.5 5:15    96/77 (86)  74 100 5.4 4.6 5.7   ICU:    112/80 (94)  75 99 5.2 7.8 5.8     Patient tolerated the procedure well. PIs were > 4.5 throughout the case with no power elevations.   Patient Disposition: 2S11

## 2015-08-28 NOTE — Progress Notes (Signed)
Pharmacy Antibiotic Note  [name] is a 45 y.o. male with HF s/p LVAD 9/16  transferred from OSH after contracting CDiff and developing MSSA abscess and drive line infection Pharmacy has been consulted for Nafcillin, Rifampin and vancomycin po dosing. WBC 13> wnl, afebrile, Cr 1.1 stable.  S/P OR for debriedment 5/9 and plan to go back 5/11.  New culture data sent  - f/u new cultures Rifmapin can decrease serum concentration of digoxin (level 0.7 4/17 upper limit so ok) andmay decrease amiodarone activity.  Patient with Afib hx currently in SR - monitor  Plan: Nafcillin 2gm IV q4 Rifampin 300mg  po q8 Vancomycin 125mg  po q6   Height: 5\' 7"  (170.2 cm) Weight: 190 lb 7.6 oz (86.4 kg) IBW/kg (Calculated) : 66.1  Temp (24hrs), Avg:98.2 F (36.8 C), Min:97 F (36.1 C), Max:99 F (37.2 C)   Recent Labs Lab 08/26/15 2155 08/27/15 0410 08/28/15 0450  WBC 11.4* 13.3* 8.1  CREATININE  --  1.20 1.16    Estimated Creatinine Clearance: 82.6 mL/min (by C-G formula based on Cr of 1.16).    Allergies  Allergen Reactions  . Phytonadione Other (See Comments)    Patient has LVAD: please check with LVAD coordinator on call or LVAD MD on call before reversal of anticoagulation with vit k    Antimicrobials this admission: 5/8 nafcillin >>  5/8 vancomycin po >>  5/9 Rifampin>> Dose adjustments this admission:   Microbiology results: 5/2 OSH MSSA 5/2 OSH Cdiff 5/9 BCx: ngtd 5/9 Wound Cx ngtd 5/8 MRSA PCR: neg   Bonnita Nasuti Pharm.D. CPP, BCPS Clinical Pharmacist [phone] 08/28/2015 2:57 PM

## 2015-08-28 NOTE — Anesthesia Preprocedure Evaluation (Addendum)
Anesthesia Evaluation  Patient identified by MRN, date of birth, ID band  Reviewed: Allergy & Precautions, NPO status , Patient's Chart, lab work & pertinent test results  Airway Mallampati: I  TM Distance: >3 FB Neck ROM: Full    Dental   Pulmonary    Pulmonary exam normal        Cardiovascular hypertension, Pt. on medications Normal cardiovascular exam+ Cardiac Defibrillator   LVAD in situ   Neuro/Psych Depression    GI/Hepatic   Endo/Other    Renal/GU      Musculoskeletal   Abdominal   Peds  Hematology   Anesthesia Other Findings   Reproductive/Obstetrics                            Anesthesia Physical Anesthesia Plan  ASA: III  Anesthesia Plan: MAC   Post-op Pain Management:    Induction: Intravenous  Airway Management Planned: Natural Airway  Additional Equipment:   Intra-op Plan:   Post-operative Plan:   Informed Consent: I have reviewed the patients History and Physical, chart, labs and discussed the procedure including the risks, benefits and alternatives for the proposed anesthesia with the patient or authorized representative who has indicated his/her understanding and acceptance.     Plan Discussed with: CRNA and Surgeon  Anesthesia Plan Comments:         Anesthesia Quick Evaluation

## 2015-08-28 NOTE — Progress Notes (Signed)
ANTICOAGULATION CONSULT NOTE - Follow Up Consult  Pharmacy Consult for heparin Indication: LVAD  Allergies  Allergen Reactions  . Phytonadione Other (See Comments)    Patient has LVAD: please check with LVAD coordinator on call or LVAD MD on call before reversal of anticoagulation with vit k    Patient Measurements: Height: 5\' 7"  (170.2 cm) Weight: 190 lb 7.6 oz (86.4 kg) IBW/kg (Calculated) : 66.1 Heparin Dosing Weight: 85kg  Vital Signs: Temp: 98.7 F (37.1 C) (05/10 0348) Temp Source: Oral (05/10 0348) BP: 118/97 mmHg (05/10 0600) Pulse Rate: 66 (05/10 0600)  Labs:  Recent Labs  08/26/15 2155 08/27/15 0410 08/28/15 0450  HGB 8.4* 7.8* 8.0*  HCT 25.7* 23.9* 23.9*  PLT 331 376 264  LABPROT 29.2* 27.1* 18.5*  INR 2.82* 2.55* 1.54*  CREATININE  --  1.20 1.16    Estimated Creatinine Clearance: 82.6 mL/min (by C-G formula based on Cr of 1.16).   Assessment: 45yo male on Coumadin PTA for LVAD, Coumadin now on hold, to start heparin for INR <1.8.  Goal of Therapy:  Heparin level 0.3-0.7 units/ml Monitor platelets by anticoagulation protocol: Yes   Plan:  Will begin heparin gtt at 1000 units/hr (was previously therapeutic at this rate) and monitor heparin levels and CBC.  Wynona Neat, PharmD, BCPS  08/28/2015,7:03 AM

## 2015-08-28 NOTE — Anesthesia Postprocedure Evaluation (Signed)
Anesthesia Post Note  Patient: [name]  Procedure(s) Performed: Procedure(s) (LRB): WOUND IRRIGATION AND DEBRIDEMENT (N/A) APPLICATION OF WOUND VAC (N/A)  Patient location during evaluation: PACU Anesthesia Type: MAC Level of consciousness: awake and alert Pain management: pain level controlled Vital Signs Assessment: post-procedure vital signs reviewed and stable Respiratory status: spontaneous breathing, nonlabored ventilation, respiratory function stable and patient connected to nasal cannula oxygen Cardiovascular status: stable and blood pressure returned to baseline Anesthetic complications: no    Last Vitals:  Filed Vitals:   08/28/15 0800 08/28/15 0900  BP:  117/87  Pulse: 70 69  Temp:    Resp: 18 20    Last Pain:  Filed Vitals:   08/28/15 0942  PainSc: [place]                 Anvika Gashi DAVID

## 2015-08-28 NOTE — Progress Notes (Signed)
ANTICOAGULATION CONSULT NOTE - Follow Up Consult  Pharmacy Consult for heparin Indication: LVAD  Allergies  Allergen Reactions  . Phytonadione Other (See Comments)    Patient has LVAD: please check with LVAD coordinator on call or LVAD MD on call before reversal of anticoagulation with vit k    Patient Measurements: Height: 5\' 7"  (170.2 cm) Weight: 190 lb 7.6 oz (86.4 kg) IBW/kg (Calculated) : 66.1 Heparin Dosing Weight: 85kg  Vital Signs: Temp: 98.4 F (36.9 C) (05/10 1600) Temp Source: Oral (05/10 1600) BP: 107/84 mmHg (05/10 1600) Pulse Rate: 75 (05/10 1600)  Labs:  Recent Labs  08/26/15 2155 08/27/15 0410 08/28/15 0450 08/28/15 1500 08/28/15 1600  HGB 8.4* 7.8* 8.0*  --  9.5*  HCT 25.7* 23.9* 23.9*  --  28.0*  PLT 331 376 264  --  295  LABPROT 29.2* 27.1* 18.5*  --   --   INR 2.82* 2.55* 1.54*  --   --   HEPARINUNFRC  --   --   --  <0.10*  --   CREATININE  --  1.20 1.16  --   --     Estimated Creatinine Clearance: 82.6 mL/min (by C-G formula based on Cr of 1.16).   Assessment: 45yo male on Coumadin PTA for LVAD, Coumadin now on hold, to start heparin for INR <1.8.  Initial HL is undetectable on heparin 1000 units/hr. Nurse reports no issues with infusion or bleeding.  Goal of Therapy:  Heparin level 0.3-0.7 units/ml Monitor platelets by anticoagulation protocol: Yes   Plan:  Bolus 2000 units of heparin and increase rate to 1300 units/hr 6h HL Daily HL Monitor s/sx of bleeding  Andrey Cota. Diona Foley, PharmD, [facility] Clinical Pharmacist Pager [phone]  08/28/2015,5:00 PM

## 2015-08-28 NOTE — Progress Notes (Signed)
HeartMate 2 Rounding Note  Subjective:    Admitted from [facility] in [place] with driveline abscess Blood cultures --> MSSA and has C diff. Remains on Nafcillin  And oral vancomycin.   08/27/15 S/P I &D Epigastric with hardware exposed and VAC placement. Yesterday he also received 1UPRBCs. Hemoglobin with minimal improvement. 7.8>8.0. Weight up 9 pounds.   Denies SOB. Complaining of abdominal pain around VAC.   UA negative.   INR 1.5    LVAD INTERROGATION:  HeartMate II LVAD:  Flow 4.7liters/min, speed 9200, power 5.3 , PI 7.8 .   Objective:    Vital Signs:   Temp:  [97 F (36.1 C)-99 F (37.2 C)] 98.7 F (37.1 C) (05/10 0348) Pulse Rate:  [62-75] 66 (05/10 0600) Resp:  [13-23] 18 (05/10 0600) BP: (106-122)/(82-97) 118/97 mmHg (05/10 0600) SpO2:  [98 %-100 %] 99 % (05/10 0600) Weight:  [190 lb 7.6 oz (86.4 kg)] 190 lb 7.6 oz (86.4 kg) (05/10 0400) Last BM Date: 08/27/15 Mean arterial Pressure 90-100s   Intake/Output:   Intake/Output Summary (Last 24 hours) at 08/28/15 0718 Last data filed at 08/28/15 0600  Gross per 24 hour  Intake   4021 ml  Output     70 ml  Net   3951 ml     Physical Exam: General:  No resp difficulty. In bed.  HEENT: normal Neck: supple. JVP to jaw. Carotids 2+ bilat; no bruits. No lymphadenopathy or thryomegaly appreciated. Cor: Mechanical heart sounds with LVAD hum present. Lungs: LLL RLL crackles  Abdomen: soft, tender, distended. No hepatosplenomegaly. No bruits or masses. Good bowel sounds. Mid epigastric Wound VAC.  Driveline: C/D/I; securement device intact and driveline incorporated Extremities: no cyanosis, clubbing, rash, edema R and LLE SCDs.  Neuro: alert & orientedx3, cranial nerves grossly intact. moves all 4 extremities w/o difficulty. Affect pleasant  Telemetry: NSR 70s   Labs: Basic Metabolic Panel:  Recent Labs Lab 08/26/15 2155 08/27/15 0410 08/28/15 0450  NA  --  139 141  K  --  3.9 3.4*    CL  --  108 107  CO2  --  22 23  GLUCOSE  --  97 83  BUN  --  18 14  CREATININE  --  1.20 1.16  CALCIUM  --  8.1* 8.1*  MG 1.9  --   --     Liver Function Tests:  Recent Labs Lab 08/27/15 1048  AST 44*  ALT 41  ALKPHOS 114  BILITOT 2.0*  PROT 6.0*  ALBUMIN 1.7*   No results for input(s): LIPASE, AMYLASE in the last 168 hours. No results for input(s): AMMONIA in the last 168 hours.  CBC:  Recent Labs Lab 08/26/15 2155 08/27/15 0410 08/28/15 0450  WBC 11.4* 13.3* 8.1  NEUTROABS 9.7*  --   --   HGB 8.4* 7.8* 8.0*  HCT 25.7* 23.9* 23.9*  MCV 62.4* 60.8* 64.1*  PLT 331 376 264    INR:  Recent Labs Lab 08/26/15 2155 08/27/15 0410 08/28/15 0450  INR 2.82* 2.55* 1.54*    Other results:    Imaging: Dg Chest Port 1 View  08/27/2015  CLINICAL DATA:  Right arm PICC line placement EXAM: PORTABLE CHEST 1 VIEW COMPARISON:  08/26/2015 FINDINGS: Cardiomediastinal silhouette is stable. Left ventricular assist device is unchanged in position. Single lead cardiac pacemaker is unchanged in position. Persistent atelectasis or scarring in lingula. No pulmonary edema. Status post median sternotomy. There is right arm PICC line with tip in  SVC right atrium junction. No pneumothorax. No segmental infiltrate. IMPRESSION: Right arm PICC line with tip in SVC right atrium junction. No pneumothorax. Left ventricular assist device is unchanged in position. Again noted atelectasis or scarring in lingula. No infiltrate or pulmonary edema. Stable single lead cardiac pacemaker position. Electronically Signed   By: Lahoma Crocker M.D.   On: 08/27/2015 11:58   Dg Chest Port 1 View  08/26/2015  CLINICAL DATA:  Initial evaluation for left ventricular assist device complication. EXAM: PORTABLE CHEST 1 VIEW COMPARISON:  Prior radiograph from 01/21/2015. FINDINGS: Left-sided transvenous pacemaker/AICD he is stable. LVAD device partially visualize, grossly stable position relative to most recent  radiograph. Cardiomegaly is unchanged. Mild elevation left hemidiaphragm, similar to prior. With no pulmonary edema or pleural effusion. No consolidative airspace disease. Triangular opacity at the lingula of likely reflects atelectasis/scar. No pneumothorax. Osseous structures unchanged. IMPRESSION: 1. Mild subsegmental atelectasis/scarring at the lingula. No other active cardiopulmonary disease. 2. Grossly similar position and alignment of partially visualized LVAD device. 3. Stable cardiomegaly without pulmonary edema. No pleural effusion. Electronically Signed   By: Jeannine Boga M.D.   On: 08/26/2015 21:26     Medications:     Scheduled Medications: . allopurinol  300 mg Oral Daily  . amiodarone  100 mg Oral Daily  . citalopram  20 mg Oral Daily  . digoxin  0.125 mg Oral Daily  . docusate sodium  200 mg Oral Daily  . ferrous sulfate  325 mg Oral BID WC  . hydrALAZINE  100 mg Oral Q8H  . nafcillin IV  2 g Intravenous Q4H  . pantoprazole  40 mg Oral Daily  . pregabalin  75 mg Oral BID  . rifampin  300 mg Oral Q8H  . sildenafil  20 mg Oral TID  . sodium chloride flush  10-40 mL Intracatheter Q12H  . sodium chloride flush  3 mL Intravenous Q12H  . spironolactone  12.5 mg Oral Daily  . tamsulosin  0.4 mg Oral Daily  . vancomycin  125 mg Oral QID    Infusions: . dextrose 5 % and 0.9% NaCl 70 mL/hr at 08/28/15 0500  . heparin      PRN Medications: sodium chloride, bisacodyl **OR** bisacodyl, ondansetron, sodium chloride flush, sodium chloride flush, traMADol, zolpidem   Assessment:  1. LVAD Complication- Driveline abscess 2. MSSA bacteremia 3. Acute/Chronic systolic HF s/p VAD placement 9/16 4. C. Difficile colitis 5. 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 Microcytic anemia 8. Urinary retention 9. Hypokalelmia   Plan/Discussion:    S/P I&D LVAD complication. Exposed hardware. ID following. Continue po Vanc, Nafcillin, and  Rifampin. Wound Cultures pending. Blood cultures pending   Todays hemoglobin 7.8>8.0 after 1UPRBCs. Give 1UPRBCs now. Give 2units FFP this evening per Dr Lawson Fiscal.   INR 1.5. Start heparin drip. Pharmacy dosing.   Maps running high. Increase spiro to 25 mg daily and add 25 mg losartan. Continue hydralazine 100 mg tid.   Volume status elevated. Weight up 9 pounds since admit. Give 40 mg IV lasix now + 40 meq of potassium.   I reviewed the LVAD parameters from today, and compared the results to the patient's prior recorded data.  No programming changes were made.  The LVAD is functioning within specified parameters.  The patient performs LVAD self-test daily.  LVAD interrogation was negative for any significant power changes, alarms or PI events/speed drops.  LVAD equipment check completed and is in good working order.  Back-up equipment present.   LVAD education done on emergency procedures and precautions and reviewed exit site care.  Length of Stay: 2  Amy Clegg NP-C 08/28/2015, 7:18 AM  VAD Team --- VAD ISSUES ONLY--- Pager [phone] (7am - 7am)  Advanced Heart Failure Team  Pager [phone] (M-F; 7a - 4p)  Please contact [facility] for night-coverage after hours (4p -7a ) and weekends on [email or web address]  Patient seen and examined with Darrick Grinder, NP. We discussed all aspects of the encounter. I agree with the assessment and plan as stated above.   S/p I&D of abscess. C/o pain at site. Will continue abx. Plan for repeat wound debridement and change wound vac tomorrow. Will do TEE at same time in OR. Will continue to discuss need for pump exchange with VAD team, [facility] and ID.   C. Diff being treated with oral vanc.   Agree with starting heparin for low INR.   Weight up. Will diurese with IV lasix and restart spiro. VAD parameters ok.   The patient is critically ill with multiple organ systems failure and requires high complexity decision making for assessment and support, frequent  evaluation and titration of therapies, application of advanced monitoring technologies and extensive interpretation of multiple databases.   Critical Care Time devoted to patient care services described in this note is 35 Minutes.  [name], Daniel,MD 10:58 AM

## 2015-08-28 NOTE — Progress Notes (Signed)
1 Day Post-Op Procedure(s) (LRB): WOUND IRRIGATION AND DEBRIDEMENT (N/A) APPLICATION OF WOUND VAC (N/A) Subjective: Patient had stable night, afebrile Gram stain from operative material with multiple WBC no organisms Minimal wound VAC drainage Pain well controlled INR 1.5 after FFP Outside CT scan shows minimal involvement of pump pocket with infection-titanium in outflow elbow slightly exposed at base of wound  Plan transfusion 1 unit packed cells today, more FFP and excisional debridement with wound VAC tomorrow.  Objective: Vital signs in last 24 hours: Temp:  [97 F (36.1 C)-99 F (37.2 C)] 98.7 F (37.1 C) (05/10 0348) Pulse Rate:  [62-75] 66 (05/10 0600) Cardiac Rhythm:  [-] Normal sinus rhythm (05/10 0400) Resp:  [13-23] 18 (05/10 0600) BP: (106-122)/(82-97) 118/97 mmHg (05/10 0600) SpO2:  [98 %-100 %] 99 % (05/10 0600) Weight:  [190 lb 7.6 oz (86.4 kg)] 190 lb 7.6 oz (86.4 kg) (05/10 0400)  Hemodynamic parameters for last 24 hours:  stable flows and mean arterial pressure on VAD  Intake/Output from previous day: 05/09 0701 - 05/10 0700 In: 4091 [P.O.:240; I.V.:1940; Blood:1311; IV Piggyback:600] Out: 70 [Drains:50; Blood:20] Intake/Output this shift:         Exam    General- alert and comfortable   Lungs- clear without rales, wheezes   Cor- regular rate and rhythm, no murmur , gallop   Abdomen- soft, non-tender, wound VAC in place   Extremities - warm, non-tender, minimal edema   Neuro- oriented, appropriate, no focal weakness   Lab Results:  Recent Labs  08/27/15 0410 08/28/15 0450  WBC 13.3* 8.1  HGB 7.8* 8.0*  HCT 23.9* 23.9*  PLT 376 264   BMET:  Recent Labs  08/27/15 0410 08/28/15 0450  NA 139 141  K 3.9 3.4*  CL 108 107  CO2 22 23  GLUCOSE 97 83  BUN 18 14  CREATININE 1.20 1.16  CALCIUM 8.1* 8.1*    PT/INR:  Recent Labs  08/28/15 0450  LABPROT 18.5*  INR 1.54*   ABG    Component Value Date/Time   PHART 7.386 12/23/2014  0335   HCO3 24.3* 12/23/2014 0335   TCO2 22 01/01/2015 1610   ACIDBASEDEF 0.3 12/23/2014 0335   O2SAT 52.9 01/28/2015 1030   CBG (last 3)   Recent Labs  08/26/15 2047  GLUCAP 77    Assessment/Plan: S/P Procedure(s) (LRB): WOUND IRRIGATION AND DEBRIDEMENT (N/A) APPLICATION OF WOUND VAC (N/A) Excisional debridement of wound tomorrow with replacement of wound VAC   LOS: 2 days    [name] 08/28/2015

## 2015-08-28 NOTE — Progress Notes (Signed)
INFECTIOUS DISEASE PROGRESS NOTE  ID: [name] is a 45 y.o. male with  Active Problems:   Left ventricular assist device (LVAD) complication  Subjective: Without complaints 1 loose BM yesterday. 1 so far today.   Abtx:  Anti-infectives    Start     Dose/Rate Route Frequency Ordered Stop   08/27/15 2300  vancomycin (VANCOCIN) 50 mg/mL oral solution 125 mg  Status:  Discontinued     125 mg Oral Every 6 hours 08/26/15 2200 08/27/15 0807   08/27/15 1601  vancomycin (VANCOCIN) 1,000 mg in sodium chloride 0.9 % 1,000 mL irrigation  Status:  Discontinued       As needed 08/27/15 1601 08/27/15 1740   08/27/15 1500  vancomycin (VANCOCIN) 1,000 mg in sodium chloride 0.9 % 1,000 mL irrigation  Status:  Discontinued      Irrigation To Surgery 08/27/15 1449 08/27/15 1748   08/27/15 1400  rifampin (RIFADIN) capsule 300 mg     300 mg Oral Every 8 hours 08/27/15 1141     08/27/15 1100  vancomycin (VANCOCIN) 50 mg/mL oral solution 125 mg     125 mg Oral 4 times daily 08/27/15 1052 09/10/15 0959   08/26/15 2145  nafcillin 2 g in dextrose 5 % 100 mL IVPB     2 g 200 mL/hr over 30 Minutes Intravenous Every 4 hours 08/26/15 2104        Medications:  Scheduled: . sodium chloride   Intravenous Once  . allopurinol  300 mg Oral Daily  . amiodarone  100 mg Oral Daily  . citalopram  20 mg Oral Daily  . digoxin  0.125 mg Oral Daily  . docusate sodium  200 mg Oral Daily  . ferrous sulfate  325 mg Oral BID WC  . hydrALAZINE  100 mg Oral Q8H  . losartan  25 mg Oral Daily  . nafcillin IV  2 g Intravenous Q4H  . pantoprazole  40 mg Oral Daily  . pregabalin  75 mg Oral BID  . rifampin  300 mg Oral Q8H  . sildenafil  20 mg Oral TID  . sodium chloride flush  10-40 mL Intracatheter Q12H  . sodium chloride flush  3 mL Intravenous Q12H  . spironolactone  25 mg Oral Daily  . tamsulosin  0.4 mg Oral Daily  . vancomycin  125 mg Oral QID    Objective: Vital signs in last 24 hours: Temp:  [97 F  (36.1 C)-99 F (37.2 C)] 98.7 F (37.1 C) (05/10 0348) Pulse Rate:  [62-75] 69 (05/10 0900) Resp:  [13-23] 20 (05/10 0900) BP: (106-119)/(82-97) 117/87 mmHg (05/10 0900) SpO2:  [97 %-100 %] 99 % (05/10 0900) Weight:  [86.4 kg (190 lb 7.6 oz)] 86.4 kg (190 lb 7.6 oz) (05/10 0400)   General appearance: alert, cooperative and no distress Resp: clear to auscultation bilaterally Cardio: regular rate and rhythm and humm GI: normal findings: bowel sounds normal, soft, non-tender and upper abd wound with VAC in place. tender. clean. RM! drive line site is clean.  and abnormal findings:  guarding  Lab Results  Recent Labs  08/27/15 0410 08/28/15 0450  WBC 13.3* 8.1  HGB 7.8* 8.0*  HCT 23.9* 23.9*  NA 139 141  K 3.9 3.4*  CL 108 107  CO2 22 23  BUN 18 14  CREATININE 1.20 1.16   Liver Panel  Recent Labs  08/27/15 1048  PROT 6.0*  ALBUMIN 1.7*  AST 44*  ALT 41  ALKPHOS 114  BILITOT 2.0*  BILIDIR 0.9*  IBILI 1.1*   Sedimentation Rate No results for input(s): ESRSEDRATE in the last 72 hours. C-Reactive Protein No results for input(s): CRP in the last 72 hours.  Microbiology: Recent Results (from the past 240 hour(s))  MRSA PCR Screening     Status: None   Collection Time: 08/26/15 10:00 PM  Result Value Ref Range Status   MRSA by PCR NEGATIVE NEGATIVE Final    Comment:        The GeneXpert MRSA Assay (FDA approved for NASAL specimens only), is one component of a comprehensive MRSA colonization surveillance program. It is not intended to diagnose MRSA infection nor to guide or monitor treatment for MRSA infections.   Stat Gram stain     Status: None   Collection Time: 08/27/15  7:30 AM  Result Value Ref Range Status   Specimen Description ABSCESS ABDOMEN  Final   Special Requests NONE  Final   Gram Stain   Final    MODERATE WBC PRESENT,BOTH PMN AND MONONUCLEAR NO ORGANISMS SEEN    Report Status 08/27/2015 FINAL  Final  Culture, routine-abscess      Status: None (Preliminary result)   Collection Time: 08/27/15  7:30 AM  Result Value Ref Range Status   Specimen Description ABSCESS ABDOMEN  Final   Special Requests NONE  Final   Gram Stain   Final    MODERATE WBC PRESENT,BOTH PMN AND MONONUCLEAR NO SQUAMOUS EPITHELIAL CELLS SEEN NO ORGANISMS SEEN Performed at [facility] Performed at [facility]    Culture PENDING  Incomplete   Report Status PENDING  Incomplete  Anaerobic culture     Status: None (Preliminary result)   Collection Time: 08/27/15  5:48 PM  Result Value Ref Range Status   Specimen Description WOUND CHEST  Final   Special Requests NONE  Final   Gram Stain   Final    NO WBC SEEN NO SQUAMOUS EPITHELIAL CELLS SEEN NO ORGANISMS SEEN Performed at [facility]    Culture PENDING  Incomplete   Report Status PENDING  Incomplete  Wound culture     Status: None (Preliminary result)   Collection Time: 08/27/15  5:48 PM  Result Value Ref Range Status   Specimen Description WOUND CHEST  Final   Special Requests NONE  Final   Gram Stain   Final    NO WBC SEEN NO SQUAMOUS EPITHELIAL CELLS SEEN NO ORGANISMS SEEN Performed at [facility]    Culture PENDING  Incomplete   Report Status PENDING  Incomplete    Studies/Results: Dg Chest Port 1 View  08/28/2015  CLINICAL DATA:  Presence of left ventricular assist device. History of debridement of chest wound. EXAM: PORTABLE CHEST 1 VIEW COMPARISON:  08/27/2015 FINDINGS: Again noted is the LVAD and left cardiac ICD. Heart size is enlarged but stable. Median sternotomy wires are noted. There is a right arm PICC line with the tip in the region of the superior cavoatrial junction. Mild tenting along the left cardiac border probably represents scarring based on the stability. Otherwise, the lungs are clear without pulmonary edema. Negative for a pneumothorax. IMPRESSION: Stable support apparatuses. No acute chest findings. Electronically Signed   By:  Markus Daft M.D.   On: 08/28/2015 07:56   Dg Chest Port 1 View  08/27/2015  CLINICAL DATA:  Right arm PICC line placement EXAM: PORTABLE CHEST 1 VIEW COMPARISON:  08/26/2015 FINDINGS: Cardiomediastinal silhouette is stable. Left ventricular assist device is unchanged in  position. Single lead cardiac pacemaker is unchanged in position. Persistent atelectasis or scarring in lingula. No pulmonary edema. Status post median sternotomy. There is right arm PICC line with tip in SVC right atrium junction. No pneumothorax. No segmental infiltrate. IMPRESSION: Right arm PICC line with tip in SVC right atrium junction. No pneumothorax. Left ventricular assist device is unchanged in position. Again noted atelectasis or scarring in lingula. No infiltrate or pulmonary edema. Stable single lead cardiac pacemaker position. Electronically Signed   By: Lahoma Crocker M.D.   On: 08/27/2015 11:58   Dg Chest Port 1 View  08/26/2015  CLINICAL DATA:  Initial evaluation for left ventricular assist device complication. EXAM: PORTABLE CHEST 1 VIEW COMPARISON:  Prior radiograph from 01/21/2015. FINDINGS: Left-sided transvenous pacemaker/AICD he is stable. LVAD device partially visualize, grossly stable position relative to most recent radiograph. Cardiomegaly is unchanged. Mild elevation left hemidiaphragm, similar to prior. With no pulmonary edema or pleural effusion. No consolidative airspace disease. Triangular opacity at the lingula of likely reflects atelectasis/scar. No pneumothorax. Osseous structures unchanged. IMPRESSION: 1. Mild subsegmental atelectasis/scarring at the lingula. No other active cardiopulmonary disease. 2. Grossly similar position and alignment of partially visualized LVAD device. 3. Stable cardiomegaly without pulmonary edema. No pleural effusion. Electronically Signed   By: Jeannine Boga M.D.   On: 08/26/2015 21:26   Dg Abd Portable 1v  08/28/2015  CLINICAL DATA:  Colitis EXAM: PORTABLE ABDOMEN - 1 VIEW  COMPARISON:  12/28/2014 FINDINGS: LVAD device is stable. AICD device is stable. Percutaneous pacemaker wires have been removed. Dilated bowel loops have markedly improved. No obvious free intraperitoneal gas. IMPRESSION: Resolved ileus. Electronically Signed   By: Marybelle Killings M.D.   On: 08/28/2015 07:56     Assessment/Plan: Abscess Drive lin infection AICD MSSA bacteremia (I spoke with [facility] lab and verified this) C diff  Total days of antibiotics: 2 nafcillin, po vanco, rifampin  Await his Cx appreciate pharm dosing rifampin Continue nafcillin, watch Cr due to risk of ARF c diff seems under control.  Debrided yesterday, again tomorrow         Bobby Rumpf Infectious Diseases (pager) [phone] [email or web address] 08/28/2015, 9:56 AM  LOS: 2 days

## 2015-08-29 ENCOUNTER — Inpatient Hospital Stay (HOSPITAL_COMMUNITY): Payer: 59 | Admitting: Certified Registered Nurse Anesthetist

## 2015-08-29 ENCOUNTER — Encounter (HOSPITAL_COMMUNITY): Payer: Self-pay | Admitting: Plastic Surgery

## 2015-08-29 ENCOUNTER — Inpatient Hospital Stay (HOSPITAL_COMMUNITY): Payer: 59

## 2015-08-29 ENCOUNTER — Encounter (HOSPITAL_COMMUNITY)
Admission: AD | Disposition: A | Discharge status: Rehabilitation Facility | Payer: Self-pay | Source: Ambulatory Visit | Attending: Internal Medicine

## 2015-08-29 DIAGNOSIS — R7881 Bacteremia: Secondary | ICD-10-CM | POA: Insufficient documentation

## 2015-08-29 DIAGNOSIS — Z9889 Other specified postprocedural states: Secondary | ICD-10-CM

## 2015-08-29 DIAGNOSIS — B9561 Methicillin susceptible Staphylococcus aureus infection as the cause of diseases classified elsewhere: Secondary | ICD-10-CM | POA: Insufficient documentation

## 2015-08-29 DIAGNOSIS — A0472 Enterocolitis due to Clostridium difficile, not specified as recurrent: Secondary | ICD-10-CM | POA: Insufficient documentation

## 2015-08-29 HISTORY — PX: STERNAL WOUND DEBRIDEMENT: SHX1058

## 2015-08-29 HISTORY — PX: APPLICATION OF WOUND VAC: SHX5189

## 2015-08-29 LAB — POCT I-STAT, CHEM 8
BUN: 10 mg/dL (ref 6–20)
CREATININE: 1 mg/dL (ref 0.61–1.24)
Calcium, Ion: 1.14 mmol/L (ref 1.12–1.23)
Chloride: 104 mmol/L (ref 101–111)
GLUCOSE: 88 mg/dL (ref 65–99)
HCT: 34 % — ABNORMAL LOW (ref 39.0–52.0)
HEMOGLOBIN: 11.6 g/dL — AB (ref 13.0–17.0)
POTASSIUM: 3.1 mmol/L — AB (ref 3.5–5.1)
Sodium: 142 mmol/L (ref 135–145)
TCO2: 28 mmol/L (ref 0–100)

## 2015-08-29 LAB — LACTATE DEHYDROGENASE: LDH: 255 U/L — ABNORMAL HIGH (ref 98–192)

## 2015-08-29 LAB — POCT I-STAT 3, ART BLOOD GAS (G3+)
Bicarbonate: 25.2 mEq/L — ABNORMAL HIGH (ref 20.0–24.0)
O2 Saturation: 100 %
PH ART: 7.386 (ref 7.350–7.450)
TCO2: 26 mmol/L (ref 0–100)
pCO2 arterial: 42.1 mmHg (ref 35.0–45.0)
pO2, Arterial: 459 mmHg — ABNORMAL HIGH (ref 80.0–100.0)

## 2015-08-29 LAB — HEPARIN LEVEL (UNFRACTIONATED)
HEPARIN UNFRACTIONATED: 0.18 [IU]/mL — AB (ref 0.30–0.70)
Heparin Unfractionated: 0.26 IU/mL — ABNORMAL LOW (ref 0.30–0.70)
Heparin Unfractionated: <0.1 IU/mL — ABNORMAL LOW (ref 0.30–0.70)

## 2015-08-29 LAB — BASIC METABOLIC PANEL
Anion gap: 12 (ref 5–15)
BUN: 10 mg/dL (ref 6–20)
CO2: 23 mmol/L (ref 22–32)
Calcium: 8.5 mg/dL — ABNORMAL LOW (ref 8.9–10.3)
Chloride: 104 mmol/L (ref 101–111)
Creatinine, Ser: 1.4 mg/dL — ABNORMAL HIGH (ref 0.61–1.24)
GFR calc Af Amer: >60 mL/min (ref >60)
GFR calc non Af Amer: 58 mL/min — ABNORMAL LOW (ref >60)
Glucose, Bld: 92 mg/dL (ref 65–99)
Potassium: 3.3 mmol/L — ABNORMAL LOW (ref 3.5–5.1)
Sodium: 139 mmol/L (ref 135–145)

## 2015-08-29 LAB — CBC
HCT: 30 % — ABNORMAL LOW (ref 39.0–52.0)
Hemoglobin: 10.3 g/dL — ABNORMAL LOW (ref 13.0–17.0)
MCH: 21.6 pg — ABNORMAL LOW (ref 26.0–34.0)
MCHC: 34.3 g/dL (ref 30.0–36.0)
MCV: 63 fL — ABNORMAL LOW (ref 78.0–100.0)
Platelets: 347 10*3/uL (ref 150–400)
RBC: 4.76 MIL/uL (ref 4.22–5.81)
RDW: 23.3 % — ABNORMAL HIGH (ref 11.5–15.5)
WBC: 11.3 10*3/uL — ABNORMAL HIGH (ref 4.0–10.5)

## 2015-08-29 LAB — PROTIME-INR
INR: 1.37 (ref 0.00–1.49)
PROTHROMBIN TIME: 16.9 s — AB (ref 11.6–15.2)

## 2015-08-29 LAB — PREPARE FRESH FROZEN PLASMA
Unit division: 0
Unit division: 0

## 2015-08-29 LAB — APTT: aPTT: 47 seconds — ABNORMAL HIGH (ref 24–37)

## 2015-08-29 SURGERY — DEBRIDEMENT, WOUND, STERNUM
Anesthesia: General | Site: Chest

## 2015-08-29 MED ORDER — DOPAMINE-DEXTROSE 3.2-5 MG/ML-% IV SOLN
0.0000 ug/kg/min | INTRAVENOUS | Status: DC
  Filled 2015-08-29: qty 250

## 2015-08-29 MED ORDER — DEXMEDETOMIDINE HCL IN NACL 400 MCG/100ML IV SOLN
0.1000 ug/kg/h | INTRAVENOUS | Status: AC
  Administered 2015-08-29: .2 ug/kg/h via INTRAVENOUS
  Filled 2015-08-29: qty 100

## 2015-08-29 MED ORDER — ONDANSETRON HCL 4 MG/2ML IJ SOLN
INTRAMUSCULAR | Status: AC
  Filled 2015-08-29: qty 2

## 2015-08-29 MED ORDER — SUGAMMADEX SODIUM 200 MG/2ML IV SOLN
INTRAVENOUS | Status: DC | PRN
  Administered 2015-08-29: 200 mg via INTRAVENOUS

## 2015-08-29 MED ORDER — ETOMIDATE 2 MG/ML IV SOLN
INTRAVENOUS | Status: AC
  Filled 2015-08-29: qty 10

## 2015-08-29 MED ORDER — ROCURONIUM BROMIDE 100 MG/10ML IV SOLN
INTRAVENOUS | Status: DC | PRN
  Administered 2015-08-29: 60 mg via INTRAVENOUS

## 2015-08-29 MED ORDER — HEPARIN SODIUM (PORCINE) 1000 UNIT/ML IJ SOLN
INTRAMUSCULAR | Status: DC
  Filled 2015-08-29: qty 30

## 2015-08-29 MED ORDER — SODIUM CHLORIDE 0.9 % IJ SOLN
OROMUCOSAL | Status: DC | PRN
  Administered 2015-08-29: 4 mL via TOPICAL

## 2015-08-29 MED ORDER — EPINEPHRINE HCL 0.1 MG/ML IJ SOSY
PREFILLED_SYRINGE | INTRAMUSCULAR | Status: AC
  Filled 2015-08-29: qty 10

## 2015-08-29 MED ORDER — VANCOMYCIN HCL 10 G IV SOLR
1250.0000 mg | INTRAVENOUS | Status: DC
  Filled 2015-08-29: qty 1250

## 2015-08-29 MED ORDER — MIDAZOLAM HCL 5 MG/5ML IJ SOLN
INTRAMUSCULAR | Status: DC | PRN
  Administered 2015-08-29: 2 mg via INTRAVENOUS
  Administered 2015-08-29: 1 mg via INTRAVENOUS
  Administered 2015-08-29: 2 mg via INTRAVENOUS

## 2015-08-29 MED ORDER — DEXTROSE 5 % IV SOLN
0.0000 ug/min | INTRAVENOUS | Status: DC
  Filled 2015-08-29: qty 4

## 2015-08-29 MED ORDER — SODIUM CHLORIDE 0.9 % IV SOLN
600.0000 mg | INTRAVENOUS | Status: DC
  Filled 2015-08-29: qty 600

## 2015-08-29 MED ORDER — SUGAMMADEX SODIUM 200 MG/2ML IV SOLN
INTRAVENOUS | Status: AC
  Filled 2015-08-29: qty 2

## 2015-08-29 MED ORDER — NITROGLYCERIN IN D5W 200-5 MCG/ML-% IV SOLN
0.0000 ug/min | INTRAVENOUS | Status: DC
  Filled 2015-08-29: qty 250

## 2015-08-29 MED ORDER — ALBUMIN HUMAN 5 % IV SOLN
INTRAVENOUS | Status: DC | PRN
  Administered 2015-08-29 (×2): via INTRAVENOUS

## 2015-08-29 MED ORDER — HEPARIN (PORCINE) IN NACL 100-0.45 UNIT/ML-% IJ SOLN
1300.0000 [IU]/h | INTRAMUSCULAR | Status: DC
  Administered 2015-08-29: 1000 [IU]/h via INTRAVENOUS
  Filled 2015-08-29: qty 250

## 2015-08-29 MED ORDER — SODIUM CHLORIDE 0.9 % IV SOLN
INTRAVENOUS | Status: DC
  Filled 2015-08-29: qty 40

## 2015-08-29 MED ORDER — LACTATED RINGERS IV SOLN
INTRAVENOUS | Status: DC | PRN
  Administered 2015-08-29: 08:00:00 via INTRAVENOUS

## 2015-08-29 MED ORDER — OXYCODONE HCL 5 MG PO TABS: 5.0000 mg | ORAL_TABLET | Freq: Once | ORAL | Status: DC | PRN

## 2015-08-29 MED ORDER — VANCOMYCIN HCL 1000 MG IV SOLR
INTRAVENOUS | Status: DC
  Filled 2015-08-29: qty 1000

## 2015-08-29 MED ORDER — LACTATED RINGERS IV SOLN
INTRAVENOUS | Status: DC | PRN
  Administered 2015-08-29: 07:00:00 via INTRAVENOUS

## 2015-08-29 MED ORDER — FENTANYL CITRATE (PF) 100 MCG/2ML IJ SOLN
INTRAMUSCULAR | Status: DC | PRN
  Administered 2015-08-29: 150 ug via INTRAVENOUS
  Administered 2015-08-29 (×2): 50 ug via INTRAVENOUS

## 2015-08-29 MED ORDER — ONDANSETRON HCL 4 MG/2ML IJ SOLN
INTRAMUSCULAR | Status: DC | PRN
  Administered 2015-08-29: 4 mg via INTRAVENOUS

## 2015-08-29 MED ORDER — SODIUM CHLORIDE 0.9 % IR SOLN
Status: DC | PRN
  Administered 2015-08-29: 4000 mL

## 2015-08-29 MED ORDER — DEXTROSE 5 % IV SOLN
750.0000 mg | INTRAVENOUS | Status: DC
Start: 2015-08-29 — End: 2015-08-29
  Filled 2015-08-29: qty 750

## 2015-08-29 MED ORDER — MIDAZOLAM HCL 10 MG/2ML IJ SOLN
INTRAMUSCULAR | Status: AC
  Filled 2015-08-29: qty 2

## 2015-08-29 MED ORDER — MAGNESIUM SULFATE 50 % IJ SOLN
40.0000 meq | INTRAMUSCULAR | Status: DC
  Filled 2015-08-29: qty 10

## 2015-08-29 MED ORDER — MILRINONE LACTATE IN DEXTROSE 20-5 MG/100ML-% IV SOLN
0.3000 ug/kg/min | INTRAVENOUS | Status: DC
  Filled 2015-08-29: qty 100

## 2015-08-29 MED ORDER — DOBUTAMINE IN D5W 4-5 MG/ML-% IV SOLN
2.0000 ug/kg/min | INTRAVENOUS | Status: DC
  Filled 2015-08-29: qty 250

## 2015-08-29 MED ORDER — NOREPINEPHRINE BITARTRATE 1 MG/ML IV SOLN
0.0000 ug/min | INTRAVENOUS | Status: DC
  Filled 2015-08-29: qty 4

## 2015-08-29 MED ORDER — FENTANYL CITRATE (PF) 250 MCG/5ML IJ SOLN
INTRAMUSCULAR | Status: AC
  Filled 2015-08-29: qty 30

## 2015-08-29 MED ORDER — ROCURONIUM BROMIDE 50 MG/5ML IV SOLN
INTRAVENOUS | Status: AC
  Filled 2015-08-29: qty 2

## 2015-08-29 MED ORDER — ACETAMINOPHEN 160 MG/5ML PO SOLN: 325.0000 mg | ORAL | Status: DC | PRN

## 2015-08-29 MED ORDER — PHENYLEPHRINE HCL 10 MG/ML IJ SOLN
0.0000 ug/min | INTRAVENOUS | Status: DC
  Filled 2015-08-29: qty 2

## 2015-08-29 MED ORDER — VANCOMYCIN HCL 1000 MG IV SOLR
1000.0000 mg | INTRAVENOUS | Status: AC
  Administered 2015-08-29: 1000 mg
  Filled 2015-08-29: qty 1000

## 2015-08-29 MED ORDER — LIDOCAINE 2% (20 MG/ML) 5 ML SYRINGE
INTRAMUSCULAR | Status: AC
  Filled 2015-08-29: qty 5

## 2015-08-29 MED ORDER — OXYCODONE HCL 5 MG/5ML PO SOLN
5.0000 mg | Freq: Once | ORAL | Status: DC | PRN
Start: 2015-08-29 — End: 2015-08-29

## 2015-08-29 MED ORDER — ACETAMINOPHEN 325 MG PO TABS: 325.0000 mg | ORAL_TABLET | ORAL | Status: DC | PRN

## 2015-08-29 MED ORDER — NOREPINEPHRINE BITARTRATE 1 MG/ML IV SOLN
4000.0000 ug | INTRAVENOUS | Status: DC | PRN
  Administered 2015-08-29: 3 ug/min via INTRAVENOUS

## 2015-08-29 MED ORDER — PHENYLEPHRINE 40 MCG/ML (10ML) SYRINGE FOR IV PUSH (FOR BLOOD PRESSURE SUPPORT)
PREFILLED_SYRINGE | INTRAVENOUS | Status: AC
  Filled 2015-08-29: qty 10

## 2015-08-29 MED ORDER — ETOMIDATE 2 MG/ML IV SOLN
INTRAVENOUS | Status: DC | PRN
  Administered 2015-08-29: 14 mg via INTRAVENOUS

## 2015-08-29 MED ORDER — SODIUM CHLORIDE 0.9 % IV SOLN
INTRAVENOUS | Status: DC
  Filled 2015-08-29: qty 2.5

## 2015-08-29 MED ORDER — FLUCONAZOLE IN SODIUM CHLORIDE 400-0.9 MG/200ML-% IV SOLN
400.0000 mg | INTRAVENOUS | Status: DC
  Filled 2015-08-29: qty 200

## 2015-08-29 MED ORDER — DEXTROSE-NACL 5-0.9 % IV SOLN
INTRAVENOUS | Status: DC
  Administered 2015-09-02 – 2015-09-05 (×5): via INTRAVENOUS

## 2015-08-29 MED ORDER — FENTANYL CITRATE (PF) 100 MCG/2ML IJ SOLN: 25.0000 ug | INTRAMUSCULAR | Status: DC | PRN

## 2015-08-29 MED ORDER — DEXTROSE 5 % IV SOLN
1.5000 g | INTRAVENOUS | Status: DC
  Filled 2015-08-29: qty 1.5

## 2015-08-29 MED ORDER — PHENYLEPHRINE HCL 10 MG/ML IJ SOLN
INTRAMUSCULAR | Status: DC | PRN
  Administered 2015-08-29: 80 ug via INTRAVENOUS
  Administered 2015-08-29: 120 ug via INTRAVENOUS

## 2015-08-29 MED ORDER — SODIUM CHLORIDE 0.9 % IV SOLN
0.0400 [IU]/min | INTRAVENOUS | Status: DC
  Filled 2015-08-29: qty 2

## 2015-08-29 MED ORDER — POTASSIUM CHLORIDE CRYS ER 20 MEQ PO TBCR
40.0000 meq | EXTENDED_RELEASE_TABLET | ORAL | Status: AC
  Administered 2015-08-29 (×2): 40 meq via ORAL
  Filled 2015-08-29 (×2): qty 2

## 2015-08-29 MED ORDER — POTASSIUM CHLORIDE 2 MEQ/ML IV SOLN
80.0000 meq | INTRAVENOUS | Status: DC
  Filled 2015-08-29: qty 40

## 2015-08-29 MED ORDER — LIDOCAINE HCL (CARDIAC) 20 MG/ML IV SOLN
INTRAVENOUS | Status: DC | PRN
  Administered 2015-08-29: 20 mg via INTRAVENOUS
  Administered 2015-08-29: 40 mg via INTRAVENOUS

## 2015-08-29 SURGICAL SUPPLY — 128 items
ADAPTER CARDIO PERF ANTE/RETRO (ADAPTER) IMPLANT
ADAPTER DLP PERFUSION .25INX2I (MISCELLANEOUS) IMPLANT
ANTEGRADE CPLG (MISCELLANEOUS) IMPLANT
ATTRACTOMAT 16X20 MAGNETIC DRP (DRAPES) IMPLANT
BAG DECANTER FOR FLEXI CONT (MISCELLANEOUS) IMPLANT
BENZOIN TINCTURE PRP APPL 2/3 (GAUZE/BANDAGES/DRESSINGS) IMPLANT
BLADE STERNUM SYSTEM 6 (BLADE) IMPLANT
BLADE SURG 10 STRL SS (BLADE) ×4 IMPLANT
BLADE SURG 12 STRL SS (BLADE) ×8 IMPLANT
BLADE SURG 15 STRL LF DISP TIS (BLADE) ×4 IMPLANT
BLADE SURG 15 STRL SS (BLADE) ×4
BNDG GAUZE ELAST 4 BULKY (GAUZE/BANDAGES/DRESSINGS) IMPLANT
CANISTER SUCTION 2500CC (MISCELLANEOUS) ×4 IMPLANT
CANNULA ARTERIAL NVNT 3/8 20FR (MISCELLANEOUS) IMPLANT
CANNULA VENOUS LOW PROF 34X46 (CANNULA) IMPLANT
CATH CPB KIT VANTRIGT (MISCELLANEOUS) IMPLANT
CATH FOLEY 2WAY SLVR  5CC 14FR (CATHETERS)
CATH FOLEY 2WAY SLVR 5CC 14FR (CATHETERS) IMPLANT
CATH HEART VENT LEFT (CATHETERS) IMPLANT
CATH HYDRAGLIDE XL THORACIC (CATHETERS) IMPLANT
CATH ROBINSON RED A/P 18FR (CATHETERS) IMPLANT
CATH THORACIC 28FR (CATHETERS) IMPLANT
CATH THORACIC 28FR RT ANG (CATHETERS) IMPLANT
CATH THORACIC 36FR (CATHETERS) IMPLANT
CATH THORACIC 36FR RT ANG (CATHETERS) IMPLANT
CHLORAPREP W/TINT 26ML (MISCELLANEOUS) ×4 IMPLANT
CLIP TI WIDE RED SMALL 24 (CLIP) IMPLANT
CONN ST 1/4X3/8  BEN (MISCELLANEOUS)
CONN ST 1/4X3/8 BEN (MISCELLANEOUS) IMPLANT
CONT SPEC 4OZ CLIKSEAL STRL BL (MISCELLANEOUS) IMPLANT
COVER SURGICAL LIGHT HANDLE (MISCELLANEOUS) ×4 IMPLANT
CRADLE DONUT ADULT HEAD (MISCELLANEOUS) ×4 IMPLANT
DRAIN CHANNEL 28F RND 3/8 FF (WOUND CARE) IMPLANT
DRAIN CHANNEL 32F RND 10.7 FF (WOUND CARE) IMPLANT
DRAPE BILATERAL SPLIT (DRAPES) ×4 IMPLANT
DRAPE CV SPLIT W-CLR ANES SCRN (DRAPES) ×4 IMPLANT
DRAPE INCISE IOBAN 66X45 STRL (DRAPES) IMPLANT
DRAPE LAPAROSCOPIC ABDOMINAL (DRAPES) ×4 IMPLANT
DRAPE SLUSH/WARMER DISC (DRAPES) ×4 IMPLANT
DRSG AQUACEL AG ADV 3.5X14 (GAUZE/BANDAGES/DRESSINGS) IMPLANT
DRSG MEPITEL 4X7.2 (GAUZE/BANDAGES/DRESSINGS) ×4 IMPLANT
DRSG OPSITE 6X11 MED (GAUZE/BANDAGES/DRESSINGS) ×4 IMPLANT
DRSG PAD ABDOMINAL 8X10 ST (GAUZE/BANDAGES/DRESSINGS) IMPLANT
DRSG VAC ATS SM SENSATRAC (GAUZE/BANDAGES/DRESSINGS) ×4 IMPLANT
ELECT BLADE 4.0 EZ CLEAN MEGAD (MISCELLANEOUS)
ELECT BLADE 6.5 EXT (BLADE) IMPLANT
ELECT CAUTERY BLADE 6.4 (BLADE) ×4 IMPLANT
ELECT REM PT RETURN 9FT ADLT (ELECTROSURGICAL) ×8
ELECTRODE BLDE 4.0 EZ CLN MEGD (MISCELLANEOUS) IMPLANT
ELECTRODE REM PT RTRN 9FT ADLT (ELECTROSURGICAL) ×4 IMPLANT
FELT TEFLON 6X6 (MISCELLANEOUS) IMPLANT
GAUZE SPONGE 4X4 12PLY STRL (GAUZE/BANDAGES/DRESSINGS) IMPLANT
GAUZE XEROFORM 5X9 LF (GAUZE/BANDAGES/DRESSINGS) IMPLANT
GLOVE BIO SURGEON STRL SZ 6 (GLOVE) ×16 IMPLANT
GLOVE BIO SURGEON STRL SZ7.5 (GLOVE) ×8 IMPLANT
GLOVE BIOGEL PI IND STRL 6 (GLOVE) ×8 IMPLANT
GLOVE BIOGEL PI IND STRL 6.5 (GLOVE) ×8 IMPLANT
GLOVE BIOGEL PI INDICATOR 6 (GLOVE) ×8
GLOVE BIOGEL PI INDICATOR 6.5 (GLOVE) ×8
GOWN STRL REUS W/ TWL LRG LVL3 (GOWN DISPOSABLE) ×10 IMPLANT
GOWN STRL REUS W/ TWL XL LVL3 (GOWN DISPOSABLE) IMPLANT
GOWN STRL REUS W/TWL LRG LVL3 (GOWN DISPOSABLE) ×10
GOWN STRL REUS W/TWL XL LVL3 (GOWN DISPOSABLE)
HANDPIECE INTERPULSE COAX TIP (DISPOSABLE) ×2
HEMOSTAT POWDER SURGIFOAM 1G (HEMOSTASIS) ×8 IMPLANT
HEMOSTAT SURGICEL 2X14 (HEMOSTASIS) IMPLANT
INSERT FOGARTY XLG (MISCELLANEOUS) IMPLANT
KIT BASIN OR (CUSTOM PROCEDURE TRAY) ×4 IMPLANT
KIT ROOM TURNOVER OR (KITS) ×4 IMPLANT
KIT SUCTION CATH 14FR (SUCTIONS) IMPLANT
LEAD PACING MYOCARDI (MISCELLANEOUS) IMPLANT
MICROMATRIX 1000MG (Tissue) ×4 IMPLANT
NS IRRIG 1000ML POUR BTL (IV SOLUTION) ×16 IMPLANT
PACK CHEST (CUSTOM PROCEDURE TRAY) ×4 IMPLANT
PACK OPEN HEART (CUSTOM PROCEDURE TRAY) IMPLANT
PAD ARMBOARD 7.5X6 YLW CONV (MISCELLANEOUS) ×8 IMPLANT
PAD DEFIB R2 (MISCELLANEOUS) ×4 IMPLANT
PUNCH AORTIC ROTATE 4.5MM 8IN (MISCELLANEOUS) IMPLANT
SEALANT SURG COSEAL 8ML (VASCULAR PRODUCTS) IMPLANT
SET HNDPC FAN SPRY TIP SCT (DISPOSABLE) ×2 IMPLANT
SHEATH AVANTI 11CM 5FR (MISCELLANEOUS) IMPLANT
SOLUTION PARTIC MCRMTRX 1000MG (Tissue) ×2 IMPLANT
SPONGE LAP 18X18 X RAY DECT (DISPOSABLE) IMPLANT
SPONGE LAP 4X18 X RAY DECT (DISPOSABLE) ×4 IMPLANT
STAPLER VISISTAT 35W (STAPLE) IMPLANT
STOPCOCK 4 WAY LG BORE MALE ST (IV SETS) IMPLANT
STRAP MONTGOMERY 1.25X11-1/8 (MISCELLANEOUS) IMPLANT
SUCKER INTRACARDIAC WEIGHTED (SUCKER) IMPLANT
SUT ETHIBOND 2 0 SH (SUTURE)
SUT ETHIBOND 2 0 SH 36X2 (SUTURE) IMPLANT
SUT ETHIBOND 5 LR DA (SUTURE) IMPLANT
SUT ETHIBOND NAB MH 2-0 36IN (SUTURE) IMPLANT
SUT ETHILON 3 0 FSL (SUTURE) IMPLANT
SUT PROLENE 3 0 RB 1 (SUTURE) IMPLANT
SUT PROLENE 3 0 SH DA (SUTURE) IMPLANT
SUT PROLENE 4 0 RB 1 (SUTURE)
SUT PROLENE 4-0 RB1 .5 CRCL 36 (SUTURE) IMPLANT
SUT PROLENE 5 0 C1 (SUTURE) IMPLANT
SUT SILK  1 MH (SUTURE)
SUT SILK 1 MH (SUTURE) IMPLANT
SUT SILK 1 TIES 10X30 (SUTURE) IMPLANT
SUT SILK 2 0 SH CR/8 (SUTURE) IMPLANT
SUT STEEL 6MS V (SUTURE) IMPLANT
SUT STEEL STERNAL CCS#1 18IN (SUTURE) IMPLANT
SUT STEEL SZ 6 DBL 3X14 BALL (SUTURE) IMPLANT
SUT TEM PAC WIRE 2 0 SH (SUTURE) IMPLANT
SUT VIC AB 1 CTX 36 (SUTURE)
SUT VIC AB 1 CTX36XBRD ANBCTR (SUTURE) IMPLANT
SUT VIC AB 2-0 CTX 27 (SUTURE) IMPLANT
SUT VIC AB 3-0 SH 8-18 (SUTURE) ×4 IMPLANT
SUT VIC AB 3-0 X1 27 (SUTURE) IMPLANT
SUT VICRYL 2 TP 1 (SUTURE) IMPLANT
SWAB COLLECTION DEVICE MRSA (MISCELLANEOUS) IMPLANT
SYR 50ML LL SCALE MARK (SYRINGE) IMPLANT
SYR 5ML LL (SYRINGE) ×4 IMPLANT
SYSTEM SAHARA CHEST DRAIN ATS (WOUND CARE) IMPLANT
TOWEL OR 17X24 6PK STRL BLUE (TOWEL DISPOSABLE) IMPLANT
TOWEL OR 17X26 10 PK STRL BLUE (TOWEL DISPOSABLE) ×8 IMPLANT
TRAY CATH LUMEN 1 20CM STRL (SET/KITS/TRAYS/PACK) ×8 IMPLANT
TRAY FOLEY CATH 16FRSI W/METER (SET/KITS/TRAYS/PACK) ×4 IMPLANT
TRAY FOLEY IC TEMP SENS 14FR (CATHETERS) IMPLANT
TUBE ANAEROBIC SPECIMEN COL (MISCELLANEOUS) IMPLANT
TUBE CONNECTING 12'X1/4 (SUCTIONS)
TUBE CONNECTING 12X1/4 (SUCTIONS) IMPLANT
UNDERPAD 30X30 INCONTINENT (UNDERPADS AND DIAPERS) ×4 IMPLANT
VENT LEFT HEART 12002 (CATHETERS)
WATER STERILE IRR 1000ML POUR (IV SOLUTION) ×8 IMPLANT
YANKAUER SUCT BULB TIP NO VENT (SUCTIONS) ×4 IMPLANT

## 2015-08-29 NOTE — Anesthesia Preprocedure Evaluation (Signed)
Anesthesia Evaluation  Patient identified by MRN, date of birth, ID band Patient awake    Reviewed: Allergy & Precautions, NPO status , Patient's Chart, lab work & pertinent test results  History of Anesthesia Complications Negative for: history of anesthetic complications  Airway Mallampati: I  TM Distance: >3 FB Neck ROM: Full    Dental  (+) Teeth Intact   Pulmonary neg pulmonary ROS,    Pulmonary exam normal breath sounds clear to auscultation       Cardiovascular hypertension, Pt. on medications Normal cardiovascular exam+ Cardiac Defibrillator  Rhythm:Regular  LVAD in situ   Neuro/Psych PSYCHIATRIC DISORDERS Depression negative neurological ROS     GI/Hepatic negative GI ROS, Neg liver ROS,   Endo/Other    Renal/GU Renal disease     Musculoskeletal  (+) Arthritis ,   Abdominal   Peds  Hematology  (+) Blood dyscrasia, anemia ,   Anesthesia Other Findings   Reproductive/Obstetrics                             Anesthesia Physical Anesthesia Plan  ASA: IV  Anesthesia Plan: General   Post-op Pain Management:    Induction: Intravenous  Airway Management Planned: Oral ETT  Additional Equipment: TEE and Arterial line  Intra-op Plan:   Post-operative Plan: Extubation in OR and Possible Post-op intubation/ventilation  Informed Consent: I have reviewed the patients History and Physical, chart, labs and discussed the procedure including the risks, benefits and alternatives for the proposed anesthesia with the patient or authorized representative who has indicated his/her understanding and acceptance.   Dental advisory given  Plan Discussed with: Surgeon and CRNA  Anesthesia Plan Comments:         Anesthesia Quick Evaluation

## 2015-08-29 NOTE — Progress Notes (Signed)
VAD Coordinator Procedure Note:   Patient underwent chest wound debridement and VAC placement in OR per Dr. Darcey Nora.  Hemodynamics and VAD parameters monitored by anesthesia and myself throughout the procedure. MAPs were obtained with arterial line.      Auto cuff(MAP): Arterial Line:  HR: Sat: Flow: PI: Power:     Speed:             Pre-procedure: 6:45 am    130/91 (106)    69 99 5.2 7.6 5.5      9200 7:30      137/93 (111) 136/95 (110)  67 100 5.0 7.5 5.4  Sedation Induction:  7:45     122/95 (107)  82 100 5.6 7.5 5.5 8:00    110/75 (85)  68 100 4.8 6.8 5.4 8:15    87/73 (79)  63 100 5.4 6.2 5.4 8:30    86/71 (77)  60 100 4.8 6.5 5.3 8:45    65/59 (61)  60 100 5.3 6.3 5.7 9:00    101/78 (81)  53 100 5.3 6.3 5.4 9:15    110/74 (86)  60 100 4.7 6.8 5.4 9:30    121/76 (88)  61 100 4.7 6.9 5.3  ICU: 10:15    112/65 (76)  86 100 5.5 6.1 5.7  Patient tolerated the procedure well. Dr. Haroldine Laws in room to review TEE with Dr. Ermalene Postin. No vegetation seen on native valves or ICD lead.  Dr. Lucianne Lei Trigt packed wound with Acell, covered with mepitel dressing, then wound VAC applied with plan to change next week.      Patient Disposition: 2S11

## 2015-08-29 NOTE — Progress Notes (Signed)
Day of Surgery Procedure(s) (LRB): DEBRIDEMENT OF chest wound (N/A) WOUND VAC CHANGE (N/A) Subjective: Infected VAD pump pocket - debrided and VAC applied Wound appeared clean today TEE showed no endocarditis or AICD lead vegetations Since Acell applied will leave VAC in place until next week for change Objective: Vital signs in last 24 hours: Temp:  [97.4 F (36.3 C)-98.5 F (36.9 C)] 98 F (36.7 C) (05/11 0400) Pulse Rate:  [64-80] 64 (05/11 0600) Cardiac Rhythm:  [-] Normal sinus rhythm (05/10 2000) Resp:  [17-24] 17 (05/11 0600) BP: (105-131)/(80-97) 130/97 mmHg (05/11 0600) SpO2:  [98 %-100 %] 100 % (05/11 0600) Weight:  [182 lb 15.7 oz (83 kg)] 182 lb 15.7 oz (83 kg) (05/11 0400)  Hemodynamic parameters for last 24 hours:  stable VAD function  Intake/Output from previous day: 05/10 0701 - 05/11 0700 In: 3434.1 [P.O.:300; I.V.:1733.1; Blood:701; IV Piggyback:700] Out: [id number] [Urine:4200; Drains:50; Stool:300] Intake/Output this shift: Total I/O In: 1500 [I.V.:1000; IV Piggyback:500] Out: 590 [Urine:540; Blood:50]       Exam    General- alert and comfortable   Lungs- clear without rales, wheezes   Cor- regular rate and rhythm, no murmur , gallop   Abdomen- soft, non-tender   Extremities - warm, non-tender, minimal edema   Neuro- oriented, appropriate, no focal weakness   Lab Results:  Recent Labs  08/28/15 1600 08/29/15 0508 08/29/15 0756  WBC 11.2* 11.3*  --   HGB 9.5* 10.3* 11.6*  HCT 28.0* 30.0* 34.0*  PLT 295 347  --    BMET:  Recent Labs  08/28/15 0450 08/29/15 0508 08/29/15 0756  NA 141 139 142  K 3.4* 3.3* 3.1*  CL 107 104 104  CO2 23 23  --   GLUCOSE 83 92 88  BUN 14 10 10   CREATININE 1.16 1.40* 1.00  CALCIUM 8.1* 8.5*  --     PT/INR:  Recent Labs  08/29/15 0604  LABPROT 16.9*  INR 1.37   ABG    Component Value Date/Time   PHART 7.386 08/29/2015 0759   HCO3 25.2* 08/29/2015 0759   TCO2 26 08/29/2015 0759   ACIDBASEDEF 0.3  12/23/2014 0335   O2SAT 100.0 08/29/2015 0759   CBG (last 3)   Recent Labs  08/26/15 2047  GLUCAP 77    Assessment/Plan: S/P Procedure(s) (LRB): DEBRIDEMENT OF chest wound (N/A) WOUND VAC CHANGE (N/A) Bed rest today Resume heparin 4pm w/o load and slow start, watch for bleeding from wound No heparin- since patient to receive long term rifampin consider Eliquis after wound granulates in and continue iv heparin until then   LOS: 3 days    [name] 08/29/2015

## 2015-08-29 NOTE — Consult Note (Signed)
Reason for Consult: Sternal / chest wound Referring Physician: Dr. Dahlia Byes  [name] is an 45 y.o. male.  HPI: The patient is a 45 yrs old bm here for treatment of a sternal / chest wound.  The patient is currently in the Unit and recovering from surgery earlier this week.  He had an LVAD placed.  He had a trip to [place] and came back with concerns of infection at the site of the pump.  He was taken to the OR for debridement.  There was purulence at the site.  Irrigation and debridement was done.  Acell and the VAC was placed.  He is recovering now.  Attempts are being made to see if he is a candidate for transplant.  Will need coverage over the pump.  Pictures seen and significant amount exposed.  Past Medical History  Diagnosis Date  . Chronic systolic heart failure (HCC)     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%)  . Atrial fibrillation -parosysmal      Rx w amiodarone  . Noncompliance     H/O  MEDICAL NONCOMPLIANCE  . 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 Tricuspid valve regurgitation     SEVERE  . Severe mitral regurgitation   . Polymorphic ventricular tachycardia (HCC)     RECURRENT WITH APPROPRIATE SHOCK THERAPY IN THE PAST  . Ventricular fibrillation (HCC)     WITH APPROPRIATE SHOCK THERAPY IN THE PAST  . Hypertension   . Gout   . RA (rheumatoid arthritis) ([place])   . Automatic implantable cardiac defibrillator -BSX     single chamber  . GI bleed -massive     11 Units 2012  . Elevated LFTs   . H/O hyperthyroidism   . CHF (congestive heart failure) ([place])   . AKI (acute kidney injury) ([place])   . AICD (automatic cardioverter/defibrillator) present     Past Surgical History  Procedure Laterality Date  . Cardiac catheterization  06/2006    RIGHT HEART CATH SHOWING SEVERE BIVENTRICUALR CHF WITH MARKED FILLING AND PRESSURES  . Insert / replace / remove pacemaker      GUIDANT HE ICD MODEL 2180, SERIAL # [id number]  .  Cholecystectomy    . Implantable cardioverter defibrillator generator change N/A 07/01/2011    Procedure: IMPLANTABLE CARDIOVERTER DEFIBRILLATOR GENERATOR CHANGE;  Surgeon: Deboraha Sprang, MD;  Location[facility] CATH LAB;  Service: Cardiovascular;  Laterality: N/A;  . Tee without cardioversion N/A 12/12/2014    Procedure: TRANSESOPHAGEAL ECHOCARDIOGRAM (TEE);  Surgeon: Jerline Pain, MD;  Location: [place];  Service: Cardiovascular;  Laterality: N/A;  . Cardiac catheterization N/A 12/14/2014    Procedure: Right Heart Cath;  Surgeon: Jolaine Artist, MD;  Location: [place] CV LAB;  Service: Cardiovascular;  Laterality: N/A;  . Cardiac catheterization N/A 12/14/2014    Procedure: IABP Insertion;  Surgeon: Jolaine Artist, MD;  Location: [place] CV LAB;  Service: Cardiovascular;  Laterality: N/A;  . Insertion of implantable left ventricular assist device N/A 12/20/2014    Procedure: INSERTION OF IMPLANTABLE LEFT VENTRICULAR ASSIST DEVICE;  Surgeon: Ivin Poot, MD;  Location: [place];  Service: Open Heart Surgery;  Laterality: N/A;  CIRC ARREST  NITRIC OXIDE  . Tee without cardioversion N/A 12/20/2014    Procedure: TRANSESOPHAGEAL ECHOCARDIOGRAM (TEE);  Surgeon: Ivin Poot, MD;  Location: [place];  Service: Open Heart Surgery;  Laterality: N/A;  . Sternal wound debridement N/A 08/27/2015  Procedure: WOUND IRRIGATION AND DEBRIDEMENT;  Surgeon: Ivin Poot, MD;  Location: [place];  Service: Thoracic;  Laterality: N/A;  . Application of wound vac N/A 08/27/2015    Procedure: APPLICATION OF WOUND VAC;  Surgeon: Ivin Poot, MD;  Location: [facility] OR;  Service: Thoracic;  Laterality: N/A;    Family History  Problem Relation Age of Onset  . Heart failure Brother     Social History:  reports that he has never smoked. He has never used smokeless tobacco. He reports that he does not drink alcohol or use illicit drugs.  Allergies:  Allergies  Allergen Reactions  . Phytonadione Other (See  Comments)    Patient has LVAD: please check with LVAD coordinator on call or LVAD MD on call before reversal of anticoagulation with vit k    Medications: I have reviewed the patient's current medications.  Results for orders placed or performed during the hospital encounter of 08/26/15 (from the past 48 hour(s))  Anaerobic culture     Status: None (Preliminary result)   Collection Time: 08/27/15  5:48 PM  Result Value Ref Range   Specimen Description WOUND CHEST    Special Requests NONE    Gram Stain      NO WBC SEEN NO SQUAMOUS EPITHELIAL CELLS SEEN NO ORGANISMS SEEN Performed at [facility]    Culture      NO ANAEROBES ISOLATED; CULTURE IN PROGRESS FOR 5 DAYS Performed at [facility]    Report Status PENDING   Wound culture     Status: None (Preliminary result)   Collection Time: 08/27/15  5:48 PM  Result Value Ref Range   Specimen Description WOUND CHEST    Special Requests NONE    Gram Stain      NO WBC SEEN NO SQUAMOUS EPITHELIAL CELLS SEEN NO ORGANISMS SEEN Performed at [facility]    Culture      NO GROWTH 1 DAY Performed at [facility]    Report Status PENDING   Prepare fresh frozen plasma     Status: None   Collection Time: 08/27/15  6:00 PM  Result Value Ref Range   Unit Number [id number]    Blood Component Type THWPLS APHR2    Unit division 00    Status of Unit ISSUED,FINAL    Transfusion Status OK TO TRANSFUSE    Unit Number [id number]    Blood Component Type THAWED PLASMA    Unit division 00    Status of Unit ISSUED,FINAL    Transfusion Status OK TO TRANSFUSE   Lactate dehydrogenase     Status: None   Collection Time: 08/28/15  4:50 AM  Result Value Ref Range   LDH 188 98 - 192 U/L  Protime-INR     Status: Abnormal   Collection Time: 08/28/15  4:50 AM  Result Value Ref Range   Prothrombin Time 18.5 (H) 11.6 - 15.2 seconds   INR 1.54 (H) 0.00 - 7.61  Basic metabolic panel     Status: Abnormal    Collection Time: 08/28/15  4:50 AM  Result Value Ref Range   Sodium 141 135 - 145 mmol/L   Potassium 3.4 (L) 3.5 - 5.1 mmol/L   Chloride 107 101 - 111 mmol/L   CO2 23 22 - 32 mmol/L   Glucose, Bld 83 65 - 99 mg/dL   BUN 14 6 - 20 mg/dL   Creatinine, Ser 1.16 0.61 - 1.24 mg/dL   Calcium 8.1 (L) 8.9 -  10.3 mg/dL   GFR calc non Af Amer >60 >60 mL/min   GFR calc Af Amer >60 >60 mL/min    Comment: (NOTE) The eGFR has been calculated using the CKD EPI equation. This calculation has not been validated in all clinical situations. eGFR's persistently <60 mL/min signify possible Chronic Kidney Disease.    Anion gap 11 5 - 15  CBC     Status: Abnormal   Collection Time: 08/28/15  4:50 AM  Result Value Ref Range   WBC 8.1 4.0 - 10.5 K/uL   RBC 3.73 (L) 4.22 - 5.81 MIL/uL   Hemoglobin 8.0 (L) 13.0 - 17.0 g/dL   HCT 23.9 (L) 39.0 - 52.0 %   MCV 64.1 (L) 78.0 - 100.0 fL   MCH 21.4 (L) 26.0 - 34.0 pg   MCHC 33.5 30.0 - 36.0 g/dL   RDW 21.3 (H) 11.5 - 15.5 %   Platelets 264 150 - 400 K/uL    Comment: SPECIMEN CHECKED FOR CLOTS REPEATED TO VERIFY   Prepare RBC     Status: None   Collection Time: 08/28/15  7:45 AM  Result Value Ref Range   Order Confirmation      ORDER PROCESSED BY BLOOD BANK BB SAMPLE OR UNITS ALREADY AVAILABLE  Prepare RBC     Status: None   Collection Time: 08/28/15  9:00 AM  Result Value Ref Range   Order Confirmation ORDER PROCESSED BY BLOOD BANK   Heparin level (unfractionated)     Status: Abnormal   Collection Time: 08/28/15  3:00 PM  Result Value Ref Range   Heparin Unfractionated <0.10 (L) 0.30 - 0.70 IU/mL    Comment:        IF HEPARIN RESULTS ARE BELOW EXPECTED VALUES, AND PATIENT DOSAGE HAS BEEN CONFIRMED, SUGGEST FOLLOW UP TESTING OF ANTITHROMBIN III LEVELS.   CBC     Status: Abnormal   Collection Time: 08/28/15  4:00 PM  Result Value Ref Range   WBC 11.2 (H) 4.0 - 10.5 K/uL   RBC 4.37 4.22 - 5.81 MIL/uL   Hemoglobin 9.5 (L) 13.0 - 17.0 g/dL    HCT 28.0 (L) 39.0 - 52.0 %   MCV 64.1 (L) 78.0 - 100.0 fL   MCH 21.7 (L) 26.0 - 34.0 pg   MCHC 33.9 30.0 - 36.0 g/dL   RDW 23.0 (H) 11.5 - 15.5 %   Platelets 295 150 - 400 K/uL  Prepare fresh frozen plasma     Status: None (Preliminary result)   Collection Time: 08/28/15  8:01 PM  Result Value Ref Range   Unit Number [id number]    Blood Component Type THAWED PLASMA    Unit division 00    Status of Unit ISSUED,FINAL    Transfusion Status OK TO TRANSFUSE    Unit Number [id number]    Blood Component Type THW PLS APHR    Unit division 00    Status of Unit ISSUED    Transfusion Status OK TO TRANSFUSE   Heparin level (unfractionated)     Status: Abnormal   Collection Time: 08/29/15 12:16 AM  Result Value Ref Range   Heparin Unfractionated 0.18 (L) 0.30 - 0.70 IU/mL    Comment:        IF HEPARIN RESULTS ARE BELOW EXPECTED VALUES, AND PATIENT DOSAGE HAS BEEN CONFIRMED, SUGGEST FOLLOW UP TESTING OF ANTITHROMBIN III LEVELS.   Lactate dehydrogenase     Status: Abnormal   Collection Time: 08/29/15  5:08 AM  Result Value Ref Range  LDH 255 (H) 98 - 192 U/L    Comment: SLIGHT HEMOLYSIS  Basic metabolic panel     Status: Abnormal   Collection Time: 08/29/15  5:08 AM  Result Value Ref Range   Sodium 139 135 - 145 mmol/L   Potassium 3.3 (L) 3.5 - 5.1 mmol/L   Chloride 104 101 - 111 mmol/L   CO2 23 22 - 32 mmol/L   Glucose, Bld 92 65 - 99 mg/dL   BUN 10 6 - 20 mg/dL   Creatinine, Ser 1.40 (H) 0.61 - 1.24 mg/dL   Calcium 8.5 (L) 8.9 - 10.3 mg/dL   GFR calc non Af Amer 58 (L) >60 mL/min   GFR calc Af Amer >60 >60 mL/min    Comment: (NOTE) The eGFR has been calculated using the CKD EPI equation. This calculation has not been validated in all clinical situations. eGFR's persistently <60 mL/min signify possible Chronic Kidney Disease.    Anion gap 12 5 - 15  CBC     Status: Abnormal   Collection Time: 08/29/15  5:08 AM  Result Value Ref Range   WBC 11.3 (H) 4.0 - 10.5 K/uL     RBC 4.76 4.22 - 5.81 MIL/uL   Hemoglobin 10.3 (L) 13.0 - 17.0 g/dL    Comment: REPEATED TO VERIFY   HCT 30.0 (L) 39.0 - 52.0 %   MCV 63.0 (L) 78.0 - 100.0 fL   MCH 21.6 (L) 26.0 - 34.0 pg   MCHC 34.3 30.0 - 36.0 g/dL   RDW 23.3 (H) 11.5 - 15.5 %   Platelets 347 150 - 400 K/uL    Comment: REPEATED TO VERIFY  Heparin level (unfractionated)     Status: Abnormal   Collection Time: 08/29/15  6:04 AM  Result Value Ref Range   Heparin Unfractionated 0.26 (L) 0.30 - 0.70 IU/mL    Comment:        IF HEPARIN RESULTS ARE BELOW EXPECTED VALUES, AND PATIENT DOSAGE HAS BEEN CONFIRMED, SUGGEST FOLLOW UP TESTING OF ANTITHROMBIN III LEVELS.   Protime-INR     Status: Abnormal   Collection Time: 08/29/15  6:04 AM  Result Value Ref Range   Prothrombin Time 16.9 (H) 11.6 - 15.2 seconds   INR 1.37 0.00 - 1.49  I-STAT, chem 8     Status: Abnormal   Collection Time: 08/29/15  7:56 AM  Result Value Ref Range   Sodium 142 135 - 145 mmol/L   Potassium 3.1 (L) 3.5 - 5.1 mmol/L   Chloride 104 101 - 111 mmol/L   BUN 10 6 - 20 mg/dL   Creatinine, Ser 1.00 0.61 - 1.24 mg/dL   Glucose, Bld 88 65 - 99 mg/dL   Calcium, Ion 1.14 1.12 - 1.23 mmol/L   TCO2 28 0 - 100 mmol/L   Hemoglobin 11.6 (L) 13.0 - 17.0 g/dL   HCT 34.0 (L) 39.0 - 52.0 %  I-STAT 3, arterial blood gas (G3+)     Status: Abnormal   Collection Time: 08/29/15  7:59 AM  Result Value Ref Range   pH, Arterial 7.386 7.350 - 7.450   pCO2 arterial 42.1 35.0 - 45.0 mmHg   pO2, Arterial 459.0 (H) 80.0 - 100.0 mmHg   Bicarbonate 25.2 (H) 20.0 - 24.0 mEq/L   TCO2 26 0 - 100 mmol/L   O2 Saturation 100.0 %   Patient temperature HIDE    Sample type ARTERIAL   APTT     Status: Abnormal   Collection Time: 08/29/15 11:05 AM    Result Value Ref Range   aPTT 47 (H) 24 - 37 seconds    Comment:        IF BASELINE aPTT IS ELEVATED, SUGGEST PATIENT RISK ASSESSMENT BE USED TO DETERMINE APPROPRIATE ANTICOAGULANT THERAPY.     Dg Chest Port 1  View  08/28/2015  CLINICAL DATA:  Presence of left ventricular assist device. History of debridement of chest wound. EXAM: PORTABLE CHEST 1 VIEW COMPARISON:  08/27/2015 FINDINGS: Again noted is the LVAD and left cardiac ICD. Heart size is enlarged but stable. Median sternotomy wires are noted. There is a right arm PICC line with the tip in the region of the superior cavoatrial junction. Mild tenting along the left cardiac border probably represents scarring based on the stability. Otherwise, the lungs are clear without pulmonary edema. Negative for a pneumothorax. IMPRESSION: Stable support apparatuses. No acute chest findings. Electronically Signed   By: Adam  Henn M.D.   On: 08/28/2015 07:56   Dg Abd Portable 1v  08/28/2015  CLINICAL DATA:  Colitis EXAM: PORTABLE ABDOMEN - 1 VIEW COMPARISON:  12/28/2014 FINDINGS: LVAD device is stable. AICD device is stable. Percutaneous pacemaker wires have been removed. Dilated bowel loops have markedly improved. No obvious free intraperitoneal gas. IMPRESSION: Resolved ileus. Electronically Signed   By: Arthur  Hoss M.D.   On: 08/28/2015 07:56    Review of Systems  Constitutional: Positive for malaise/fatigue.  HENT: Negative.   Eyes: Negative.   Cardiovascular: Negative.   Genitourinary: Negative.   Skin: Negative.   Psychiatric/Behavioral: Negative.    Blood pressure 107/73, pulse 77, temperature 97.8 F (36.6 C), temperature source Oral, resp. rate 20, height 5' 7" (1.702 m), weight 83 kg (182 lb 15.7 oz), SpO2 99 %. Physical Exam  Constitutional: He appears well-developed.  HENT:  Head: Normocephalic and atraumatic.  Eyes: Conjunctivae are normal. Pupils are equal, round, and reactive to light.  Respiratory: Effort normal. No respiratory distress.    GI: Soft. He exhibits no distension.  Neurological: He is alert.  Skin: Skin is warm.  Psychiatric: He has a normal mood and affect.    Assessment/Plan: Will plan to take the patient to the OR  for Debridement of the sternal / chest wound with Acell and VAC placement.  Will plan to take him to the OR the follow week and likely do a muscle flap for definitive coverage.  Linda Grimmer S Akisha Sturgill 08/29/2015, 4:19 PM      

## 2015-08-29 NOTE — Progress Notes (Signed)
  Echocardiogram Echocardiogram Transesophageal has been performed.  [name] 08/29/2015, 8:14 AM

## 2015-08-29 NOTE — Op Note (Signed)
NAME:  SERGE, BAYERL NO.:  MEDICAL RECORD NO.:  [id number]  LOCATION:                                 FACILITY:  PHYSICIAN:  Ivin Poot, M.D.  DATE OF BIRTH:  Jun 12, 1970  DATE OF PROCEDURE:  08/27/2015 DATE OF DISCHARGE:                              OPERATIVE REPORT   OPERATION:  Irrigation and wound VAC of upper abdominal wound-LVAD pump pocket.  SURGEON:  Ivin Poot, MD.  ANESTHESIA:  General.  PREOPERATIVE DIAGNOSIS:  Wound infection of LVAD pump pocket.  POSTOPERATIVE DIAGNOSIS:  Wound infection of LVAD pump pocket.  INDICATIONS:  The patient is a 45 year old, African American male who underwent implantation of a HeartMate 2 LVAD in September 2016.  While out of town in [place], he developed C. diff colitis, weakness, and swelling of the lower part of the sternal incision.  Blood cultures reported positive for MSSA.  When he presented to this hospital, he had a red fluctuant soft tissue abscess, which started to drain spontaneously.  He was taken to the operating room for irrigation and wound VAC.  Management with minimal debridement since he is still fully anticoagulated.  Informed consent was obtained.  OPERATIVE PROCEDURE:  The patient was brought to the operating room and placed supine on the operating table where IV conscious sedation was administered by the Anesthesia team.  The chest and abdomen were prepped and draped as a sterile field.  A proper time-out was performed.  1% lidocaine was infiltrated into the wound edges.  The wound was examined.  The outflow elbow of the LVAD was visible.  The abscess cavity measured approximately 6 cm, but was well walled off from the rest of the pump pocket.  Cultures were taken of the fibrinous material. About 2 L of saline pulse lavage was then administered to the infected area.  This was followed by vancomycin irrigation.  This was followed by placement of a small wound VAC sponge,  which was then covered with a sterile drapes and connected to the suction system.  The patient tolerated the procedure well, and returned back to the ICU.     Ivin Poot, M.D.     PV/MEDQ  D:  08/27/2015  T:  08/28/2015  Job:  [id number]

## 2015-08-29 NOTE — Progress Notes (Signed)
INFECTIOUS DISEASE PROGRESS NOTE  ID: [name] is a 45 y.o. male with  Active Problems:   Left ventricular assist device (LVAD) complication   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[place])   Abscess of abdominal wall   Acute on chronic systolic (congestive) heart failure (HCC)  Subjective: Groggy post OR  Abtx:  Anti-infectives    Start     Dose/Rate Route Frequency Ordered Stop   08/30/15 0400  vancomycin (VANCOCIN) 1,250 mg in sodium chloride 0.9 % 250 mL IVPB  Status:  Discontinued     1,250 mg 166.7 mL/hr over 90 Minutes Intravenous To Surgery 08/29/15 0611 08/29/15 1012   08/30/15 0400  cefUROXime (ZINACEF) 1.5 g in dextrose 5 % 50 mL IVPB  Status:  Discontinued     1.5 g 100 mL/hr over 30 Minutes Intravenous To Surgery 08/29/15 0611 08/29/15 1012   08/30/15 0400  fluconazole (DIFLUCAN) IVPB 400 mg  Status:  Discontinued     400 mg 100 mL/hr over 120 Minutes Intravenous To Surgery 08/29/15 0611 08/29/15 1012   08/30/15 0400  rifampin (RIFADIN) 600 mg in sodium chloride 0.9 % 100 mL IVPB  Status:  Discontinued     600 mg 200 mL/hr over 30 Minutes Intravenous To Surgery 08/29/15 0611 08/29/15 1012   08/29/15 0845  vancomycin (VANCOCIN) 1,000 mg in sodium chloride 0.9 % 1,000 mL irrigation  Status:  Discontinued      Irrigation To Surgery 08/29/15 0834 08/29/15 1012   08/29/15 0615  cefUROXime (ZINACEF) 750 mg in dextrose 5 % 50 mL IVPB  Status:  Discontinued     750 mg 100 mL/hr over 30 Minutes Intravenous To Surgery 08/29/15 0609 08/29/15 1012   08/29/15 0615  vancomycin (VANCOCIN) powder 1,000 mg     1,000 mg Other To Surgery 08/29/15 0609 08/29/15 0900   08/27/15 2300  vancomycin (VANCOCIN) 50 mg/mL oral solution 125 mg  Status:  Discontinued     125 mg Oral Every 6 hours 08/26/15 2200 08/27/15 0807   08/27/15 1601  vancomycin (VANCOCIN) 1,000 mg in sodium chloride 0.9 % 1,000 mL irrigation  Status:  Discontinued       As needed 08/27/15 1601 08/27/15 1740     08/27/15 1500  vancomycin (VANCOCIN) 1,000 mg in sodium chloride 0.9 % 1,000 mL irrigation  Status:  Discontinued      Irrigation To Surgery 08/27/15 1449 08/27/15 1748   08/27/15 1400  rifampin (RIFADIN) capsule 300 mg     300 mg Oral Every 8 hours 08/27/15 1141     08/27/15 1100  vancomycin (VANCOCIN) 50 mg/mL oral solution 125 mg     125 mg Oral 4 times daily 08/27/15 1052 09/10/15 0959   08/26/15 2145  nafcillin 2 g in dextrose 5 % 100 mL IVPB     2 g 200 mL/hr over 30 Minutes Intravenous Every 4 hours 08/26/15 2104        Medications:  Scheduled: . allopurinol  300 mg Oral Daily  . amiodarone  100 mg Oral Daily  . citalopram  20 mg Oral Daily  . digoxin  0.125 mg Oral Daily  . docusate sodium  200 mg Oral Daily  . ferrous sulfate  325 mg Oral BID WC  . hydrALAZINE  100 mg Oral Q8H  . losartan  25 mg Oral Daily  . nafcillin IV  2 g Intravenous Q4H  . pantoprazole  40 mg Oral Daily  . pregabalin  75 mg Oral BID  .  rifampin  300 mg Oral Q8H  . sildenafil  20 mg Oral TID  . sodium chloride flush  10-40 mL Intracatheter Q12H  . sodium chloride flush  3 mL Intravenous Q12H  . spironolactone  25 mg Oral Daily  . tamsulosin  0.4 mg Oral Daily  . vancomycin  125 mg Oral QID    Objective: Vital signs in last 24 hours: Temp:  [97.4 F (36.3 C)-98.5 F (36.9 C)] 98 F (36.7 C) (05/11 0400) Pulse Rate:  [64-80] 64 (05/11 0600) Resp:  [17-24] 17 (05/11 0600) BP: (105-131)/(80-97) 130/97 mmHg (05/11 0600) SpO2:  [98 %-100 %] 100 % (05/11 0600) Weight:  [83 kg (182 lb 15.7 oz)] 83 kg (182 lb 15.7 oz) (05/11 0400)   General appearance: cooperative, fatigued and no distress Resp: clear to auscultation bilaterally Chest wall: no tenderness, at AICD site Cardio: regular rate and rhythm and hum GI: abnormal findings:  distended, hypoactive bowel sounds and wound vac in place in upper abd. R abd drive line site clean.   Lab Results  Recent Labs  08/28/15 0450 08/28/15 1600  08/29/15 0508 08/29/15 0756  WBC 8.1 11.2* 11.3*  --   HGB 8.0* 9.5* 10.3* 11.6*  HCT 23.9* 28.0* 30.0* 34.0*  NA 141  --  139 142  K 3.4*  --  3.3* 3.1*  CL 107  --  104 104  CO2 23  --  23  --   BUN 14  --  10 10  CREATININE 1.16  --  1.40* 1.00   Liver Panel  Recent Labs  08/27/15 1048  PROT 6.0*  ALBUMIN 1.7*  AST 44*  ALT 41  ALKPHOS 114  BILITOT 2.0*  BILIDIR 0.9*  IBILI 1.1*   Sedimentation Rate No results for input(s): ESRSEDRATE in the last 72 hours. C-Reactive Protein No results for input(s): CRP in the last 72 hours.  Microbiology: Recent Results (from the past 240 hour(s))  MRSA PCR Screening     Status: None   Collection Time: 08/26/15 10:00 PM  Result Value Ref Range Status   MRSA by PCR NEGATIVE NEGATIVE Final    Comment:        The GeneXpert MRSA Assay (FDA approved for NASAL specimens only), is one component of a comprehensive MRSA colonization surveillance program. It is not intended to diagnose MRSA infection nor to guide or monitor treatment for MRSA infections.   Stat Gram stain     Status: None   Collection Time: 08/27/15  7:30 AM  Result Value Ref Range Status   Specimen Description ABSCESS ABDOMEN  Final   Special Requests NONE  Final   Gram Stain   Final    MODERATE WBC PRESENT,BOTH PMN AND MONONUCLEAR NO ORGANISMS SEEN    Report Status 08/27/2015 FINAL  Final  Culture, routine-abscess     Status: None (Preliminary result)   Collection Time: 08/27/15  7:30 AM  Result Value Ref Range Status   Specimen Description ABSCESS ABDOMEN  Final   Special Requests NONE  Final   Gram Stain   Final    MODERATE WBC PRESENT,BOTH PMN AND MONONUCLEAR NO SQUAMOUS EPITHELIAL CELLS SEEN NO ORGANISMS SEEN Performed at [facility] Performed at [facility]    Culture PENDING  Incomplete   Report Status PENDING  Incomplete  Culture, blood (Routine X 2) w Reflex to ID Panel     Status: None (Preliminary result)   Collection  Time: 08/27/15 10:38 AM  Result Value Ref Range  Status   Specimen Description BLOOD RIGHT HAND  Final   Special Requests IN PEDIATRIC BOTTLE 3CC  Final   Culture NO GROWTH 1 DAY  Final   Report Status PENDING  Incomplete  Culture, blood (Routine X 2) w Reflex to ID Panel     Status: None (Preliminary result)   Collection Time: 08/27/15 10:42 AM  Result Value Ref Range Status   Specimen Description BLOOD LEFT HAND  Final   Special Requests IN PEDIATRIC BOTTLE 3CC  Final   Culture NO GROWTH 1 DAY  Final   Report Status PENDING  Incomplete  Anaerobic culture     Status: None (Preliminary result)   Collection Time: 08/27/15  5:48 PM  Result Value Ref Range Status   Specimen Description WOUND CHEST  Final   Special Requests NONE  Final   Gram Stain   Final    NO WBC SEEN NO SQUAMOUS EPITHELIAL CELLS SEEN NO ORGANISMS SEEN Performed at [facility]    Culture   Final    NO ANAEROBES ISOLATED; CULTURE IN PROGRESS FOR 5 DAYS Performed at [facility]    Report Status PENDING  Incomplete  Wound culture     Status: None (Preliminary result)   Collection Time: 08/27/15  5:48 PM  Result Value Ref Range Status   Specimen Description WOUND CHEST  Final   Special Requests NONE  Final   Gram Stain   Final    NO WBC SEEN NO SQUAMOUS EPITHELIAL CELLS SEEN NO ORGANISMS SEEN Performed at [facility]    Culture   Final    NO GROWTH 1 DAY Performed at [facility]    Report Status PENDING  Incomplete    Studies/Results: Dg Chest Port 1 View  08/28/2015  CLINICAL DATA:  Presence of left ventricular assist device. History of debridement of chest wound. EXAM: PORTABLE CHEST 1 VIEW COMPARISON:  08/27/2015 FINDINGS: Again noted is the LVAD and left cardiac ICD. Heart size is enlarged but stable. Median sternotomy wires are noted. There is a right arm PICC line with the tip in the region of the superior cavoatrial junction. Mild tenting along the left cardiac  border probably represents scarring based on the stability. Otherwise, the lungs are clear without pulmonary edema. Negative for a pneumothorax. IMPRESSION: Stable support apparatuses. No acute chest findings. Electronically Signed   By: Markus Daft M.D.   On: 08/28/2015 07:56   Dg Chest Port 1 View  08/27/2015  CLINICAL DATA:  Right arm PICC line placement EXAM: PORTABLE CHEST 1 VIEW COMPARISON:  08/26/2015 FINDINGS: Cardiomediastinal silhouette is stable. Left ventricular assist device is unchanged in position. Single lead cardiac pacemaker is unchanged in position. Persistent atelectasis or scarring in lingula. No pulmonary edema. Status post median sternotomy. There is right arm PICC line with tip in SVC right atrium junction. No pneumothorax. No segmental infiltrate. IMPRESSION: Right arm PICC line with tip in SVC right atrium junction. No pneumothorax. Left ventricular assist device is unchanged in position. Again noted atelectasis or scarring in lingula. No infiltrate or pulmonary edema. Stable single lead cardiac pacemaker position. Electronically Signed   By: Lahoma Crocker M.D.   On: 08/27/2015 11:58   Dg Abd Portable 1v  08/28/2015  CLINICAL DATA:  Colitis EXAM: PORTABLE ABDOMEN - 1 VIEW COMPARISON:  12/28/2014 FINDINGS: LVAD device is stable. AICD device is stable. Percutaneous pacemaker wires have been removed. Dilated bowel loops have markedly improved. No obvious free intraperitoneal gas. IMPRESSION: Resolved ileus.  Electronically Signed   By: Marybelle Killings M.D.   On: 08/28/2015 07:56     Assessment/Plan: Abscess Drive lin infection AICD MSSA bacteremia (I spoke with [facility] lab and verified this) C diff  Total days of antibiotics: 3 nafcillin, po vanco, rifampin  Continue to watch his wounds, his WBC. His stool output His TEE is negative today Cr improved Watch his LFTs, will repeat in AM Repeat BCx 5-9 ngtd         Bobby Rumpf Infectious Diseases (pager)  [phone] [email or web address] 08/29/2015, 10:27 AM  LOS: 3 days

## 2015-08-29 NOTE — Progress Notes (Signed)
The patient was examined and preop studies reviewed. There has been no change from the prior exam and the patient is ready for surgery.  Plan Wound debrridement and VAC, possible pump exchange on CPB on A [place]

## 2015-08-29 NOTE — Brief Op Note (Signed)
08/26/2015 - 08/29/2015  10:15 AM  PATIENT:  [name]  45 y.o. male  PRE-OPERATIVE DIAGNOSIS:  Infection of pump pocket  POST-OPERATIVE DIAGNOSIS:  Infection of pump pocket  PROCEDURE:  Procedure(s): DEBRIDEMENT OF chest wound (N/A) WOUND VAC CHANGE (N/A)  Application of A-Cell powder  SURGEON:  Surgeon(s) and Role:    * Ivin Poot, MD - Primary  PHYSICIAN ASSISTANT: none  ASSISTANTS: none   ANESTHESIA:   general  EBL:  Total I/O In: 500 [IV Piggyback:500] Out: 42 [Urine:540; Blood:50]  BLOOD ADMINISTERED:none  DRAINS: wound VAC sn\mall sponge  LOCAL MEDICATIONS USED:  NONE  SPECIMEN:  No Specimen  DISPOSITION OF SPECIMEN:  N/A  COUNTS:  YES  TOURNIQUET:  * No tourniquets in log *  DICTATION: .Dragon Dictation  PLAN OF CARE: return to 2 S  PATIENT DISPOSITION:  ICU - extubated and stable.   Delay start of Pharmacological VTE agent (>24hrs) due to surgical blood loss or risk of bleeding: yes

## 2015-08-29 NOTE — Progress Notes (Signed)
ANTICOAGULATION CONSULT NOTE - Follow Up Consult  Pharmacy Consult for Heparin  Indication: LVAD  Allergies  Allergen Reactions  . Phytonadione Other (See Comments)    Patient has LVAD: please check with LVAD coordinator on call or LVAD MD on call before reversal of anticoagulation with vit k    Patient Measurements: Height: 5\' 7"  (170.2 cm) Weight: 182 lb 15.7 oz (83 kg) IBW/kg (Calculated) : 66.1  Vital Signs: Temp: 97.8 F (36.6 C) (05/11 1214) Temp Source: Oral (05/11 1214) BP: 107/73 mmHg (05/11 1357) Pulse Rate: 77 (05/11 1400)  Labs:  Recent Labs  08/27/15 0410 08/28/15 0450 08/28/15 1500 08/28/15 1600 08/29/15 0016 08/29/15 0508 08/29/15 0604 08/29/15 0756 08/29/15 1105  HGB 7.8* 8.0*  --  9.5*  --  10.3*  --  11.6*  --   HCT 23.9* 23.9*  --  28.0*  --  30.0*  --  34.0*  --   PLT 376 264  --  295  --  347  --   --   --   APTT  --   --   --   --   --   --   --   --  47*  LABPROT 27.1* 18.5*  --   --   --   --  16.9*  --   --   INR 2.55* 1.54*  --   --   --   --  1.37  --   --   HEPARINUNFRC  --   --  <0.10*  --  0.18*  --  0.26*  --   --   CREATININE 1.20 1.16  --   --   --  1.40*  --  1.00  --     Estimated Creatinine Clearance: 94.2 mL/min (by C-G formula based on Cr of 1).   Assessment: Heparin for LVAD while warfarin on hold, heparin level subtherapeutic on heparin at 1300 units/hr.  Now s/p OR for debridement of infected VAD pump pocket.  Pharmacy asked to resume heparin slowly at 4 PM.  Per discussion with Dr. Prescott Gum, will resume at 1000/hr and adjust carefully from there given open wound.  Will reduce heparin level goal to 0.3-0.5.  Goal of Therapy:  Heparin level 0.3-0.5 Monitor platelets by anticoagulation protocol: Yes   Plan:  1. At 4 pm will resume heparin at 1000 units/hr.  Watch carefully for bleeding. 2. Heparin level 6 hrs after heparin restarts. 3. Daily heparin level and CBC.  Uvaldo Rising, BCPS  Clinical  Pharmacist Pager [phone]  08/29/2015 2:31 PM

## 2015-08-29 NOTE — Anesthesia Postprocedure Evaluation (Signed)
Anesthesia Post Note  Patient: [name]  Procedure(s) Performed: Procedure(s) (LRB): DEBRIDEMENT OF chest wound (N/A) WOUND VAC CHANGE (N/A)  Patient location during evaluation: PACU Anesthesia Type: General Level of consciousness: awake Pain management: pain level controlled Vital Signs Assessment: post-procedure vital signs reviewed and stable Respiratory status: spontaneous breathing Cardiovascular status: stable Postop Assessment: no signs of nausea or vomiting Anesthetic complications: no    Last Vitals:  Filed Vitals:   08/29/15 1357 08/29/15 1400  BP: 107/73   Pulse:  77  Temp:    Resp:  20    Last Pain:  Filed Vitals:   08/29/15 1400  PainSc: 0-No pain                 Royalty Fakhouri

## 2015-08-29 NOTE — Progress Notes (Signed)
ANTICOAGULATION CONSULT NOTE - Follow Up Consult  Pharmacy Consult for Heparin  Indication: LVAD  Allergies  Allergen Reactions  . Phytonadione Other (See Comments)    Patient has LVAD: please check with LVAD coordinator on call or LVAD MD on call before reversal of anticoagulation with vit k    Patient Measurements: Height: 5\' 7"  (170.2 cm) Weight: 190 lb 7.6 oz (86.4 kg) IBW/kg (Calculated) : 66.1  Vital Signs: Temp: 98.1 F (36.7 C) (05/11 0100) Temp Source: Oral (05/10 2315) BP: 120/87 mmHg (05/11 0100) Pulse Rate: 69 (05/11 0100)  Labs:  Recent Labs  08/26/15 2155 08/27/15 0410 08/28/15 0450 08/28/15 1500 08/28/15 1600 08/29/15 0016  HGB 8.4* 7.8* 8.0*  --  9.5*  --   HCT 25.7* 23.9* 23.9*  --  28.0*  --   PLT 331 376 264  --  295  --   LABPROT 29.2* 27.1* 18.5*  --   --   --   INR 2.82* 2.55* 1.54*  --   --   --   HEPARINUNFRC  --   --   --  <0.10*  --  0.18*  CREATININE  --  1.20 1.16  --   --   --     Estimated Creatinine Clearance: 82.6 mL/min (by C-G formula based on Cr of 1.16).   Assessment: Heparin for LVAD while warfarin on hold, HL sub-therapeutic at 0.18  Goal of Therapy:  Heparin level 0.3-0.7 units/ml Monitor platelets by anticoagulation protocol: Yes   Plan:  -Increase heparin to 1500 units/hr -Heparin off at 0600 for procedure -F/U re-start after procedure  Narda Bonds 08/29/2015,1:50 AM

## 2015-08-29 NOTE — Transfer of Care (Signed)
Immediate Anesthesia Transfer of Care Note  Patient: [name]  Procedure(s) Performed: Procedure(s): DEBRIDEMENT OF chest wound (N/A) WOUND VAC CHANGE (N/A)  Patient Location: SICU  Anesthesia Type:General  Level of Consciousness: patient cooperative and responds to stimulation  Airway & Oxygen Therapy: Patient Spontanous Breathing and Patient connected to face mask oxygen  Post-op Assessment: Report given to RN and Post -op Vital signs reviewed and stable  Post vital signs: Reviewed and stable  Last Vitals:  Filed Vitals:   08/29/15 0500 08/29/15 0600  BP: 124/95 130/97  Pulse: 65 64  Temp:    Resp: 19 17    Last Pain:  Filed Vitals:   08/29/15 0643  PainSc: 0-No pain      Patients Stated Pain Goal: 5 ([id number])  Complications: No apparent anesthesia complications

## 2015-08-29 NOTE — Progress Notes (Signed)
HeartMate 2 Rounding Note  Subjective:    Admitted from [facility] in [place] with driveline abscess Blood cultures --> MSSA and has C diff. Remains on Nafcillin  And oral vancomycin.   08/27/15 S/P I &D Epigastric with hardware exposed and VAC placement.   Seen in OR. Doing ok this am. Afebrile.  TEE in OR no vegetations. Will have deeper debridement and wound vac replaced.   MAPs running high 95-105  LVAD INTERROGATION:  HeartMate II LVAD:  Flow 5.0 liters/min, speed 9200, power 6.0 , PI 7.5 .   Objective:    Vital Signs:   Temp:  [97.4 F (36.3 C)-98.5 F (36.9 C)] 98 F (36.7 C) (05/11 0400) Pulse Rate:  [64-80] 64 (05/11 0600) Resp:  [16-24] 17 (05/11 0600) BP: (105-131)/(80-97) 130/97 mmHg (05/11 0600) SpO2:  [98 %-100 %] 100 % (05/11 0600) Weight:  [83 kg (182 lb 15.7 oz)] 83 kg (182 lb 15.7 oz) (05/11 0400) Last BM Date: 08/28/15 Mean arterial Pressure 95-105s   Intake/Output:   Intake/Output Summary (Last 24 hours) at 08/29/15 0852 Last data filed at 08/29/15 0835  Gross per 24 hour  Intake 3684.07 ml  Output   5050 ml  Net -1365.93 ml     Physical Exam: General:  Lying on OR table .  HEENT: normal Neck: supple.  Carotids 2+ bilat; no bruits. No lymphadenopathy or thryomegaly appreciated. Cor: Mechanical heart sounds with LVAD hum present. Lungs: clear  Abdomen: soft, tender, distended. No hepatosplenomegaly. No bruits or masses. Good bowel sounds. Mid epigastric Wound VAC.  Driveline: C/D/I; securement device intact and driveline incorporated Extremities: no cyanosis, clubbing, rash, edema R and LLE SCDs.  Neuro: alert & orientedx3, cranial nerves grossly intact. moves all 4 extremities w/o difficulty.sedated   Telemetry: NSR 70s   Labs: Basic Metabolic Panel:  Recent Labs Lab 08/26/15 2155 08/27/15 0410 08/28/15 0450 08/29/15 0508  NA  --  139 141 139  K  --  3.9 3.4* 3.3*  CL  --  108 107 104  CO2  --  22 23 23     GLUCOSE  --  97 83 92  BUN  --  18 14 10   CREATININE  --  1.20 1.16 1.40*  CALCIUM  --  8.1* 8.1* 8.5*  MG 1.9  --   --   --     Liver Function Tests:  Recent Labs Lab 08/27/15 1048  AST 44*  ALT 41  ALKPHOS 114  BILITOT 2.0*  PROT 6.0*  ALBUMIN 1.7*   No results for input(s): LIPASE, AMYLASE in the last 168 hours. No results for input(s): AMMONIA in the last 168 hours.  CBC:  Recent Labs Lab 08/26/15 2155 08/27/15 0410 08/28/15 0450 08/28/15 1600 08/29/15 0508  WBC 11.4* 13.3* 8.1 11.2* 11.3*  NEUTROABS 9.7*  --   --   --   --   HGB 8.4* 7.8* 8.0* 9.5* 10.3*  HCT 25.7* 23.9* 23.9* 28.0* 30.0*  MCV 62.4* 60.8* 64.1* 64.1* 63.0*  PLT 331 376 264 295 347    INR:  Recent Labs Lab 08/26/15 2155 08/27/15 0410 08/28/15 0450 08/29/15 0604  INR 2.82* 2.55* 1.54* 1.37    Other results:    Imaging: Dg Chest Port 1 View  08/28/2015  CLINICAL DATA:  Presence of left ventricular assist device. History of debridement of chest wound. EXAM: PORTABLE CHEST 1 VIEW COMPARISON:  08/27/2015 FINDINGS: Again noted is the LVAD and left cardiac ICD. Heart size is enlarged  but stable. Median sternotomy wires are noted. There is a right arm PICC line with the tip in the region of the superior cavoatrial junction. Mild tenting along the left cardiac border probably represents scarring based on the stability. Otherwise, the lungs are clear without pulmonary edema. Negative for a pneumothorax. IMPRESSION: Stable support apparatuses. No acute chest findings. Electronically Signed   By: Markus Daft M.D.   On: 08/28/2015 07:56   Dg Chest Port 1 View  08/27/2015  CLINICAL DATA:  Right arm PICC line placement EXAM: PORTABLE CHEST 1 VIEW COMPARISON:  08/26/2015 FINDINGS: Cardiomediastinal silhouette is stable. Left ventricular assist device is unchanged in position. Single lead cardiac pacemaker is unchanged in position. Persistent atelectasis or scarring in lingula. No pulmonary edema. Status  post median sternotomy. There is right arm PICC line with tip in SVC right atrium junction. No pneumothorax. No segmental infiltrate. IMPRESSION: Right arm PICC line with tip in SVC right atrium junction. No pneumothorax. Left ventricular assist device is unchanged in position. Again noted atelectasis or scarring in lingula. No infiltrate or pulmonary edema. Stable single lead cardiac pacemaker position. Electronically Signed   By: Lahoma Crocker M.D.   On: 08/27/2015 11:58   Dg Abd Portable 1v  08/28/2015  CLINICAL DATA:  Colitis EXAM: PORTABLE ABDOMEN - 1 VIEW COMPARISON:  12/28/2014 FINDINGS: LVAD device is stable. AICD device is stable. Percutaneous pacemaker wires have been removed. Dilated bowel loops have markedly improved. No obvious free intraperitoneal gas. IMPRESSION: Resolved ileus. Electronically Signed   By: Marybelle Killings M.D.   On: 08/28/2015 07:56     Medications:     Scheduled Medications: . [MAR Hold] allopurinol  300 mg Oral Daily  . aminocaproic acid (AMICAR) for OHS   Intravenous To OR  . [MAR Hold] amiodarone  100 mg Oral Daily  . [START ON 08/30/2015] cefUROXime (ZINACEF)  IV  1.5 g Intravenous To OR  . cefUROXime (ZINACEF)  IV  750 mg Intravenous To OR  . [MAR Hold] citalopram  20 mg Oral Daily  . [MAR Hold] digoxin  0.125 mg Oral Daily  . DOBUTamine  2-10 mcg/kg/min Intravenous To OR  . [MAR Hold] docusate sodium  200 mg Oral Daily  . DOPamine  0-10 mcg/kg/min Intravenous To OR  . [START ON 08/30/2015] epinephrine  0-10 mcg/min Intravenous To OR  . [MAR Hold] ferrous sulfate  325 mg Oral BID WC  . [START ON 08/30/2015] fluconazole (DIFLUCAN) IV  400 mg Intravenous To OR  . heparin 30,000 units/NS 1000 mL solution for CELLSAVER   Other To OR  . [MAR Hold] hydrALAZINE  100 mg Oral Q8H  . [START ON 08/30/2015] insulin (NOVOLIN-R) infusion   Intravenous To OR  . [MAR Hold] losartan  25 mg Oral Daily  . magnesium sulfate  40 mEq Other To OR  . milrinone  0.3 mcg/kg/min  Intravenous To OR  . [MAR Hold] nafcillin IV  2 g Intravenous Q4H  . nitroGLYCERIN  0-100 mcg/min Intravenous To OR  . [START ON 08/30/2015] norepinephrine (LEVOPHED) Adult infusion  0-12 mcg/min Intravenous To OR  . [MAR Hold] pantoprazole  40 mg Oral Daily  . phenylephrine (NEO-SYNEPHRINE) Adult infusion  0-100 mcg/min Intravenous To OR  . potassium chloride  80 mEq Other To OR  . [MAR Hold] pregabalin  75 mg Oral BID  . [START ON 08/30/2015] rifampin (RIFADIN) IVPB  600 mg Intravenous To OR  . [MAR Hold] rifampin  300 mg Oral Q8H  . [facility] Hold]  sildenafil  20 mg Oral TID  . [MAR Hold] sodium chloride flush  10-40 mL Intracatheter Q12H  . [MAR Hold] sodium chloride flush  3 mL Intravenous Q12H  . [MAR Hold] spironolactone  25 mg Oral Daily  . [MAR Hold] tamsulosin  0.4 mg Oral Daily  . vancomycin 1000 mg in NS (1000 ml) irrigation for Dr. Roxy Manns case   Irrigation To OR  . [START ON 08/30/2015] vancomycin  1,250 mg Intravenous To OR  . [MAR Hold] vancomycin  125 mg Oral QID  . vancomycin  1,000 mg Other To OR  . vasopressin (PITRESSIN) infusion - *FOR SHOCK*  0.04 Units/min Intravenous To OR    Infusions: . dextrose 5 % and 0.9% NaCl 70 mL/hr at 08/29/15 0600    PRN Medications: [MAR Hold] sodium chloride, [MAR Hold] bisacodyl **OR** [MAR Hold] bisacodyl, [MAR Hold] ondansetron, [MAR Hold] sodium chloride flush, [MAR Hold] sodium chloride flush, [MAR Hold] traMADol, [MAR Hold] zolpidem   Assessment:  1. LVAD Complication- Driveline abscess 2. MSSA bacteremia 3. Acute/Chronic systolic HF s/p VAD placement 9/16 4. C. Difficile colitis 5. 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 Microcytic anemia 8. Urinary retention 9. Hypokalelmia   Plan/Discussion:    S/P I&D LVAD complication. Exposed hardware. ID following. Continue po Vanc, Nafcillin, and Rifampin. All cultures NGTD. For deeper debridement and wound vac replacement today. No vegetations on TEE.  Suspect we will proceed with 6 weeks of abx then stop and see if re-infection recurs. Will discuss further with Drs. Prescott Gum and Hatcher.   C. Diff much improved.   Hemoglobin back up.   Maps running high. Will adjust anti-HTN regimen post-OR   Volume status ok. Can diurese after Or as needed.   VAD parameters stable.   I reviewed the LVAD parameters from today, and compared the results to the patient's prior recorded data.  No programming changes were made.  The LVAD is functioning within specified parameters.  The patient performs LVAD self-test daily.  LVAD interrogation was negative for any significant power changes, alarms or PI events/speed drops.  LVAD equipment check completed and is in good working order.  Back-up equipment present.   LVAD education done on emergency procedures and precautions and reviewed exit site care.  Length of Stay: 3  [name], Daniel MD  08/29/2015, 8:52 AM  VAD Team --- VAD ISSUES ONLY--- Pager [phone] (7am - 7am)  Advanced Heart Failure Team  Pager [phone] (M-F; 7a - 4p)  Please contact [facility] for night-coverage after hours (4p -7a ) and weekends on [email or web address]

## 2015-08-30 ENCOUNTER — Inpatient Hospital Stay (HOSPITAL_COMMUNITY): Payer: 59

## 2015-08-30 ENCOUNTER — Encounter (HOSPITAL_COMMUNITY): Payer: Self-pay | Admitting: Cardiothoracic Surgery

## 2015-08-30 LAB — COMPREHENSIVE METABOLIC PANEL
ALT: 48 U/L (ref 17–63)
AST: 63 U/L — ABNORMAL HIGH (ref 15–41)
Albumin: 2.1 g/dL — ABNORMAL LOW (ref 3.5–5.0)
Alkaline Phosphatase: 108 U/L (ref 38–126)
Anion gap: 13 (ref 5–15)
BUN: 11 mg/dL (ref 6–20)
CO2: 22 mmol/L (ref 22–32)
Calcium: 8.6 mg/dL — ABNORMAL LOW (ref 8.9–10.3)
Chloride: 106 mmol/L (ref 101–111)
Creatinine, Ser: 1.43 mg/dL — ABNORMAL HIGH (ref 0.61–1.24)
GFR calc Af Amer: >60 mL/min (ref >60)
GFR calc non Af Amer: 57 mL/min — ABNORMAL LOW (ref >60)
Glucose, Bld: 66 mg/dL (ref 65–99)
Potassium: 4 mmol/L (ref 3.5–5.1)
Sodium: 141 mmol/L (ref 135–145)
Total Bilirubin: 5.9 mg/dL — ABNORMAL HIGH (ref 0.3–1.2)
Total Protein: 6.6 g/dL (ref 6.5–8.1)

## 2015-08-30 LAB — HEPARIN LEVEL (UNFRACTIONATED): Heparin Unfractionated: 0.18 IU/mL — ABNORMAL LOW (ref 0.30–0.70)

## 2015-08-30 LAB — PROTIME-INR
INR: 1.57 — ABNORMAL HIGH (ref 0.00–1.49)
Prothrombin Time: 18.8 seconds — ABNORMAL HIGH (ref 11.6–15.2)

## 2015-08-30 LAB — PREPARE FRESH FROZEN PLASMA
UNIT DIVISION: 0
Unit division: 0

## 2015-08-30 LAB — CULTURE, ROUTINE-ABSCESS

## 2015-08-30 LAB — WOUND CULTURE
Culture: NO GROWTH
Gram Stain: NONE SEEN

## 2015-08-30 LAB — LACTATE DEHYDROGENASE: LDH: 217 U/L — AB (ref 98–192)

## 2015-08-30 LAB — CBC
HCT: 30 % — ABNORMAL LOW (ref 39.0–52.0)
Hemoglobin: 10 g/dL — ABNORMAL LOW (ref 13.0–17.0)
MCH: 21.5 pg — ABNORMAL LOW (ref 26.0–34.0)
MCHC: 33.3 g/dL (ref 30.0–36.0)
MCV: 64.4 fL — ABNORMAL LOW (ref 78.0–100.0)
Platelets: 368 10*3/uL (ref 150–400)
RBC: 4.66 MIL/uL (ref 4.22–5.81)
RDW: 24 % — ABNORMAL HIGH (ref 11.5–15.5)
WBC: 16 10*3/uL — ABNORMAL HIGH (ref 4.0–10.5)

## 2015-08-30 MED ORDER — HEPARIN (PORCINE) IN NACL 100-0.45 UNIT/ML-% IJ SOLN
1000.0000 [IU]/h | INTRAMUSCULAR | Status: DC
  Administered 2015-08-30 – 2015-08-31 (×2): 1200 [IU]/h via INTRAVENOUS
  Administered 2015-09-01: 1000 [IU]/h via INTRAVENOUS
  Filled 2015-08-30 (×3): qty 250

## 2015-08-30 MED ORDER — FENTANYL CITRATE (PF) 100 MCG/2ML IJ SOLN
INTRAMUSCULAR | Status: AC
  Administered 2015-08-30: 50 ug via INTRAVENOUS
  Filled 2015-08-30: qty 2

## 2015-08-30 MED ORDER — HEPARIN (PORCINE) IN NACL 100-0.45 UNIT/ML-% IJ SOLN: 1450.0000 [IU]/h | INTRAMUSCULAR | Status: DC

## 2015-08-30 MED ORDER — FENTANYL CITRATE (PF) 100 MCG/2ML IJ SOLN
50.0000 ug | Freq: Once | INTRAMUSCULAR | Status: AC
  Administered 2015-08-31: 50 ug via INTRAVENOUS
  Filled 2015-08-30: qty 2

## 2015-08-30 MED FILL — Heparin Sodium (Porcine) Inj 1000 Unit/ML: INTRAMUSCULAR | Qty: 30 | Status: AC

## 2015-08-30 MED FILL — Magnesium Sulfate Inj 50%: INTRAMUSCULAR | Qty: 10 | Status: AC

## 2015-08-30 MED FILL — Potassium Chloride Inj 2 mEq/ML: INTRAVENOUS | Qty: 40 | Status: AC

## 2015-08-30 NOTE — Progress Notes (Addendum)
ANTICOAGULATION CONSULT NOTE - Follow Up Consult  Pharmacy Consult for Heparin  Indication: LVAD  Allergies  Allergen Reactions  . Phytonadione Other (See Comments)    Patient has LVAD: please check with LVAD coordinator on call or LVAD MD on call before reversal of anticoagulation with vit k    Patient Measurements: Height: 5\' 7"  (170.2 cm) Weight: 182 lb 15.7 oz (83 kg) IBW/kg (Calculated) : 66.1  Vital Signs: Temp: 98.7 F (37.1 C) (05/11 1945) Temp Source: Oral (05/11 1945) BP: 104/83 mmHg (05/11 2200) Pulse Rate: 77 (05/11 2200)  Labs:  Recent Labs  08/27/15 0410 08/28/15 0450  08/28/15 1600 08/29/15 0016 08/29/15 0508 08/29/15 0604 08/29/15 0756 08/29/15 1105 08/29/15 2241  HGB 7.8* 8.0*  --  9.5*  --  10.3*  --  11.6*  --   --   HCT 23.9* 23.9*  --  28.0*  --  30.0*  --  34.0*  --   --   PLT 376 264  --  295  --  347  --   --   --   --   APTT  --   --   --   --   --   --   --   --  47*  --   LABPROT 27.1* 18.5*  --   --   --   --  16.9*  --   --   --   INR 2.55* 1.54*  --   --   --   --  1.37  --   --   --   HEPARINUNFRC  --   --   < >  --  0.18*  --  0.26*  --   --  <0.10*  CREATININE 1.20 1.16  --   --   --  1.40*  --  1.00  --   --   < > = values in this interval not displayed.  Estimated Creatinine Clearance: 94.2 mL/min (by C-G formula based on Cr of 1).  Assessment: Heparin for LVAD while warfarin on hold, now POD #1 from debridement of infected pump pocket, heparin level low after resuming s/p OR 5/11, no issues per RN.  Goal of Therapy:  Heparin level 0.3-0.5 units/mL Monitor platelets by anticoagulation protocol: Yes   Plan:  -Increase heparin to 1300 units/hr -0830 HL  Denny Lave 08/30/2015,12:11 AM

## 2015-08-30 NOTE — Progress Notes (Signed)
Dr Prescott Gum notified of wound vac alarming blockage.  MD at bedside to change wound vac dsg.  No problems noted.

## 2015-08-30 NOTE — Progress Notes (Signed)
INFECTIOUS DISEASE PROGRESS NOTE  ID: FIN [name] is a 45 y.o. male with  Active Problems:   Left ventricular assist device (LVAD) complication   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[place])   Abscess of abdominal wall   Acute on chronic systolic (congestive) heart failure (HCC)   Enteritis due to Clostridium difficile   Staphylococcus aureus bacteremia  Subjective: Without complaints 1 loose BM yesterday, more formed  Abtx:  Anti-infectives    Start     Dose/Rate Route Frequency Ordered Stop   08/30/15 0400  vancomycin (VANCOCIN) 1,250 mg in sodium chloride 0.9 % 250 mL IVPB  Status:  Discontinued     1,250 mg 166.7 mL/hr over 90 Minutes Intravenous To Surgery 08/29/15 0611 08/29/15 1012   08/30/15 0400  cefUROXime (ZINACEF) 1.5 g in dextrose 5 % 50 mL IVPB  Status:  Discontinued     1.5 g 100 mL/hr over 30 Minutes Intravenous To Surgery 08/29/15 0611 08/29/15 1012   08/30/15 0400  fluconazole (DIFLUCAN) IVPB 400 mg  Status:  Discontinued     400 mg 100 mL/hr over 120 Minutes Intravenous To Surgery 08/29/15 0611 08/29/15 1012   08/30/15 0400  rifampin (RIFADIN) 600 mg in sodium chloride 0.9 % 100 mL IVPB  Status:  Discontinued     600 mg 200 mL/hr over 30 Minutes Intravenous To Surgery 08/29/15 0611 08/29/15 1012   08/29/15 0845  vancomycin (VANCOCIN) 1,000 mg in sodium chloride 0.9 % 1,000 mL irrigation  Status:  Discontinued      Irrigation To Surgery 08/29/15 0834 08/29/15 1012   08/29/15 0615  cefUROXime (ZINACEF) 750 mg in dextrose 5 % 50 mL IVPB  Status:  Discontinued     750 mg 100 mL/hr over 30 Minutes Intravenous To Surgery 08/29/15 0609 08/29/15 1012   08/29/15 0615  vancomycin (VANCOCIN) powder 1,000 mg     1,000 mg Other To Surgery 08/29/15 0609 08/29/15 0900   08/27/15 2300  vancomycin (VANCOCIN) 50 mg/mL oral solution 125 mg  Status:  Discontinued     125 mg Oral Every 6 hours 08/26/15 2200 08/27/15 0807   08/27/15 1601  vancomycin (VANCOCIN) 1,000  mg in sodium chloride 0.9 % 1,000 mL irrigation  Status:  Discontinued       As needed 08/27/15 1601 08/27/15 1740   08/27/15 1500  vancomycin (VANCOCIN) 1,000 mg in sodium chloride 0.9 % 1,000 mL irrigation  Status:  Discontinued      Irrigation To Surgery 08/27/15 1449 08/27/15 1748   08/27/15 1400  rifampin (RIFADIN) capsule 300 mg     300 mg Oral Every 8 hours 08/27/15 1141     08/27/15 1100  vancomycin (VANCOCIN) 50 mg/mL oral solution 125 mg     125 mg Oral 4 times daily 08/27/15 1052 09/10/15 0959   08/26/15 2145  nafcillin 2 g in dextrose 5 % 100 mL IVPB     2 g 200 mL/hr over 30 Minutes Intravenous Every 4 hours 08/26/15 2104        Medications:  Scheduled: . allopurinol  300 mg Oral Daily  . amiodarone  100 mg Oral Daily  . citalopram  20 mg Oral Daily  . digoxin  0.125 mg Oral Daily  . docusate sodium  200 mg Oral Daily  . ferrous sulfate  325 mg Oral BID WC  . hydrALAZINE  100 mg Oral Q8H  . losartan  25 mg Oral Daily  . nafcillin IV  2 g Intravenous Q4H  .  pantoprazole  40 mg Oral Daily  . pregabalin  75 mg Oral BID  . rifampin  300 mg Oral Q8H  . sildenafil  20 mg Oral TID  . sodium chloride flush  10-40 mL Intracatheter Q12H  . sodium chloride flush  3 mL Intravenous Q12H  . spironolactone  25 mg Oral Daily  . tamsulosin  0.4 mg Oral Daily  . vancomycin  125 mg Oral QID    Objective: Vital signs in last 24 hours: Temp:  [97.7 F (36.5 C)-98.7 F (37.1 C)] 98 F (36.7 C) (05/12 0700) Pulse Rate:  [66-83] 66 (05/12 0600) Resp:  [14-21] 17 (05/12 0600) BP: (98-121)/(69-100) 121/100 mmHg (05/12 0700) SpO2:  [98 %-100 %] 100 % (05/12 0600) Weight:  [84.4 kg (186 lb 1.1 oz)] 84.4 kg (186 lb 1.1 oz) (05/12 0500)   General appearance: alert, cooperative and no distress Resp: clear to auscultation bilaterally Cardio: regular rate and rhythm and hum GI: normal findings: bowel sounds normal and soft, non-tender  Lab Results  Recent Labs  08/29/15 0508  08/29/15 0756 08/30/15 0459  WBC 11.3*  --  16.0*  HGB 10.3* 11.6* 10.0*  HCT 30.0* 34.0* 30.0*  NA 139 142 141  K 3.3* 3.1* 4.0  CL 104 104 106  CO2 23  --  22  BUN 10 10 11   CREATININE 1.40* 1.00 1.43*   Liver Panel  Recent Labs  08/27/15 1048 08/30/15 0459  PROT 6.0* 6.6  ALBUMIN 1.7* 2.1*  AST 44* 63*  ALT 41 48  ALKPHOS 114 108  BILITOT 2.0* 5.9*  BILIDIR 0.9*  --   IBILI 1.1*  --    Sedimentation Rate No results for input(s): ESRSEDRATE in the last 72 hours. C-Reactive Protein No results for input(s): CRP in the last 72 hours.  Microbiology: Recent Results (from the past 240 hour(s))  MRSA PCR Screening     Status: None   Collection Time: 08/26/15 10:00 PM  Result Value Ref Range Status   MRSA by PCR NEGATIVE NEGATIVE Final    Comment:        The GeneXpert MRSA Assay (FDA approved for NASAL specimens only), is one component of a comprehensive MRSA colonization surveillance program. It is not intended to diagnose MRSA infection nor to guide or monitor treatment for MRSA infections.   Stat Gram stain     Status: None   Collection Time: 08/27/15  7:30 AM  Result Value Ref Range Status   Specimen Description ABSCESS ABDOMEN  Final   Special Requests NONE  Final   Gram Stain   Final    MODERATE WBC PRESENT,BOTH PMN AND MONONUCLEAR NO ORGANISMS SEEN    Report Status 08/27/2015 FINAL  Final  Culture, routine-abscess     Status: None (Preliminary result)   Collection Time: 08/27/15  7:30 AM  Result Value Ref Range Status   Specimen Description ABSCESS ABDOMEN  Final   Special Requests NONE  Final   Gram Stain   Final    MODERATE WBC PRESENT,BOTH PMN AND MONONUCLEAR NO SQUAMOUS EPITHELIAL CELLS SEEN NO ORGANISMS SEEN Performed at [facility] Performed at [facility]    Culture   Final    FEW STAPHYLOCOCCUS AUREUS Note: RIFAMPIN AND GENTAMICIN SHOULD NOT BE USED AS SINGLE DRUGS FOR TREATMENT OF STAPH INFECTIONS. Performed at  [facility]    Report Status PENDING  Incomplete  Culture, blood (Routine X 2) w Reflex to ID Panel     Status: None (  Preliminary result)   Collection Time: 08/27/15 10:38 AM  Result Value Ref Range Status   Specimen Description BLOOD RIGHT HAND  Final   Special Requests IN PEDIATRIC BOTTLE 3CC  Final   Culture NO GROWTH 2 DAYS  Final   Report Status PENDING  Incomplete  Culture, blood (Routine X 2) w Reflex to ID Panel     Status: None (Preliminary result)   Collection Time: 08/27/15 10:42 AM  Result Value Ref Range Status   Specimen Description BLOOD LEFT HAND  Final   Special Requests IN PEDIATRIC BOTTLE 3CC  Final   Culture NO GROWTH 2 DAYS  Final   Report Status PENDING  Incomplete  Anaerobic culture     Status: None (Preliminary result)   Collection Time: 08/27/15  5:48 PM  Result Value Ref Range Status   Specimen Description WOUND CHEST  Final   Special Requests NONE  Final   Gram Stain   Final    NO WBC SEEN NO SQUAMOUS EPITHELIAL CELLS SEEN NO ORGANISMS SEEN Performed at [facility]    Culture   Final    NO ANAEROBES ISOLATED; CULTURE IN PROGRESS FOR 5 DAYS Performed at [facility]    Report Status PENDING  Incomplete  Wound culture     Status: None   Collection Time: 08/27/15  5:48 PM  Result Value Ref Range Status   Specimen Description WOUND CHEST  Final   Special Requests NONE  Final   Gram Stain   Final    NO WBC SEEN NO SQUAMOUS EPITHELIAL CELLS SEEN NO ORGANISMS SEEN Performed at [facility]    Culture   Final    NO GROWTH 2 DAYS Performed at [facility]    Report Status 08/30/2015 FINAL  Final    Studies/Results: Dg Chest Port 1 View  08/30/2015  CLINICAL DATA:  Debridement of chest wound EXAM: PORTABLE CHEST 1 VIEW COMPARISON:  08/28/2015 FINDINGS: Cardiac shadow remains enlarged. An LVAD is again identified as is a defibrillator. Right-sided PICC line is again seen and stable. Postsurgical changes  including atrial clipping are again noted. The lungs are well aerated bilaterally. Minimal scarring is noted in the left lung base. No focal infiltrate is seen. IMPRESSION: No acute abnormality noted. Electronically Signed   By: Inez Catalina M.D.   On: 08/30/2015 08:15     Assessment/Plan: Abscess, MSSA Drive lin infection AICD MSSA bacteremia (I spoke with [facility] lab and verified this) TEE 5-11 negative for vegetations C diff  Total days of antibiotics: 4 nafcillin, po vanco, rifampin  Continue to watch his wounds, his WBC.  His stool output is better. Would not stop vanco while he is on nafcillin. May be able to decrease his dose after 2 weeks of treatment.  Continue to monitor LFTs while on rifampin My great appreciation to [place] (pager) [phone] [email or web address] 08/30/2015, 8:24 AM  LOS: 4 days

## 2015-08-30 NOTE — Op Note (Signed)
NAME:  [name], BOTH NO.:  [id number]  MEDICAL RECORD NO.:  [id number]  LOCATION:  2S11C                        FACILITY:  [facility]  PHYSICIAN:  Ivin Poot, M.D.  DATE OF BIRTH:  [birth year]  DATE OF PROCEDURE:  08/29/2015 DATE OF DISCHARGE:                              OPERATIVE REPORT   OPERATION: 1. Sharp debridement, excisional, of VAD infected pump pocket. 2. Application of ACell powder. 3. Application of wound VAC sponge system.  SURGEON:  Ivin Poot, M.D.  PREOPERATIVE DIAGNOSIS:  Infection of the VAD pump pocket.  POSTOPERATIVE DIAGNOSIS:  Infection of the VAD pump pocket.  ANESTHESIA:  General by Dr. Ermalene Postin.  OPERATIVE PROCEDURE:  The patient was brought from the ICU to the operating room for a planned 2nd sharp debridement and excisional of an infected wound VAC which was associated with a gram-positive bacteremia of MSSA.  The procedure including the small possibility of needing pump removal was discussed with the patient prior to surgery and informed consent obtained.  The patient understood that general anesthesia would be used.  The patient was placed on the OR table and general anesthesia was induced.  A transesophageal echo probe was placed by the Cardiology- Anesthesia team for examination of the cardiac valves and implanted AICD system for any evidence of infection because of recent positive blood cultures.  The chest and abdomen were prepped and draped as a sterile field.  A proper time-out was performed.  The open wound was examined.  It was undermined going inferiorly.  The skin was incised to open the entire wound so that there were no tunneled areas.  The tissue itself looked clean, and there was no gross purulence.  There was some scar tissue around the outflow elbow of the VAD, and this was sharply debrided back to clean tissue.  The graft of the outflow system was not exposed.  The power cord was not exposed.  After  sharp debridement of devitalized tissue was completed, the wound was irrigated with 3 L of saline and then vancomycin-saline mixed with pulse lavage system.  After this, hemostasis was achieved.  Next, a bottle of ACell powder was placed into the wound.  This was covered with a cut piece of Mepitel membrane secured to the subcutaneous tissue with interrupted 3-0 Vicryl sutures.  Next, a small wound VAC sponge was cut to conform the wound, placed in the wound, and covered with sterile sheets.  It was then connected to the suction system. The patient was then reversed from anesthesia, extubated, and returned to the ICU in stable condition.  The wound VAC was placed to -125 suction.     Ivin Poot, M.D.     PV/MEDQ  D:  08/29/2015  T:  08/30/2015  Job:  [id number]

## 2015-08-30 NOTE — Progress Notes (Signed)
1 Day Post-Op Procedure(s) (LRB): DEBRIDEMENT OF chest wound (N/A) WOUND VAC CHANGE (N/A) Subjective: Doing well after wound debridement, wound VAC yesterday Minimal bloody drainage from wound VAC-IV heparin slowly being increased to therapeutic heparin activity Chest x-ray clear Operative cultures from earlier this week positive for MSSA Patient will return to OR for wound VAC change and wound exam by Dr.  Marla Roe to assess for possible muscle flap coverage Hold Coumadin for now, continue IV heparin We'll mobilize patient out of bed, encourage nutrition No evidence of VAD pump is function Symptoms of colitis have resolved with normal formed stools at this point Continue antibiotics as recommended per ID  Objective: Vital signs in last 24 hours: Temp:  [97.7 F (36.5 C)-98.7 F (37.1 C)] 98 F (36.7 C) (05/12 0700) Pulse Rate:  [66-83] 66 (05/12 0600) Cardiac Rhythm:  [-] Normal sinus rhythm (05/12 0800) Resp:  [14-21] 17 (05/12 0600) BP: (98-121)/(69-100) 121/100 mmHg (05/12 0700) SpO2:  [98 %-100 %] 100 % (05/12 0600) Weight:  [186 lb 1.1 oz (84.4 kg)] 186 lb 1.1 oz (84.4 kg) (05/12 0500)  Hemodynamic parameters for last 24 hours:  stable  Intake/Output from previous day: 05/11 0701 - 05/12 0700 In: 2240 [P.O.:90; I.V.:1150; IV Piggyback:1000] Out: 1515 [Urine:1415; Drains:50; Blood:50] Intake/Output this shift:         Exam    General- alert and comfortable   Lungs- clear without rales, wheezes   Cor- regular rate and rhythm, no murmur , gallop   Abdomen- soft, non-tender, wound VAC in place   Extremities - warm, non-tender, minimal edema   Neuro- oriented, appropriate, no focal weakness   Lab Results:  Recent Labs  08/29/15 0508 08/29/15 0756 08/30/15 0459  WBC 11.3*  --  16.0*  HGB 10.3* 11.6* 10.0*  HCT 30.0* 34.0* 30.0*  PLT 347  --  368   BMET:  Recent Labs  08/29/15 0508 08/29/15 0756 08/30/15 0459  NA 139 142 141  K 3.3* 3.1* 4.0  CL  104 104 106  CO2 23  --  22  GLUCOSE 92 88 66  BUN 10 10 11   CREATININE 1.40* 1.00 1.43*  CALCIUM 8.5*  --  8.6*    PT/INR:  Recent Labs  08/30/15 0459  LABPROT 18.8*  INR 1.57*   ABG    Component Value Date/Time   PHART 7.386 08/29/2015 0759   HCO3 25.2* 08/29/2015 0759   TCO2 26 08/29/2015 0759   ACIDBASEDEF 0.3 12/23/2014 0335   O2SAT 100.0 08/29/2015 0759   CBG (last 3)  No results for input(s): GLUCAP in the last 72 hours.  Assessment/Plan: S/P Procedure(s) (LRB): DEBRIDEMENT OF chest wound (N/A) WOUND VAC CHANGE (N/A) Continue IV antibiotics Hold Coumadin for wound VAC change and possible muscle flap coverage by plastic surgery Monday Mobilize out of bed Keep patient in ICU for now Stop IV heparin on call to the OR Monday  LOS: 4 days    [name] 08/30/2015

## 2015-08-30 NOTE — Care Management Note (Signed)
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  Plan is for pt to return to OR 09/02/15.  CM will continue to monitor for disposition needs   Expected Discharge Date:                  Expected Discharge Plan:  [employer]  In-House Referral:     Discharge planning Services  CM Consult  Post Acute Care Choice:    Choice offered to:     DME Arranged:    DME Agency:     HH Arranged:    HH Agency:     Status of Service:  In process, will continue to follow  Medicare Important Message Given:    Date Medicare IM Given:    Medicare IM give by:    Date Additional Medicare IM Given:    Additional Medicare Important Message give by:     If discussed at [place] of Stay Meetings, dates discussed:    Additional Comments:  Maryclare Labrador, RN 08/30/2015, 3:09 PM

## 2015-08-30 NOTE — Progress Notes (Signed)
CT surgery ICU note  Called to see patient because wound VAC alarm indicating blockage of the drainage.  Patient was sedated with IV fentanyl and wound VAC sponge removed using sterile technique. The base of the wound was filled with blood which was blotted out with sterile sponge. New sponge cut to the appropriate size and configuration was then replaced and covered with sterile wound VAC sheets.  IV heparin has been discontinued until later this afternoon. I discussed the anticoagulation of Mr. Hauss with the clinical pharmacist and I recommend that the dose of heparin be kept less than 1200 units per hour for 48 hours after surgery to allow the wound VAC system to work properly.  Tharon Aquas trigt M.D.

## 2015-08-30 NOTE — Progress Notes (Addendum)
HeartMate 2 Rounding Note  Subjective:    Admitted from [facility] in [place] with driveline abscess Blood cultures --> MSSA and has C diff. Remains on Nafcillin  And oral vancomycin.   08/27/15 S/P I &D Epigastric with hardware exposed and VAC placement.  5/11 repeat I&D and wound vac replacement.  5/11 TEE no vegetations on valves or ICD wire  Feels ok. MAPS 70-80s. AB a little sore.    LVAD INTERROGATION:  HeartMate II LVAD:  Flow 5.4 liters/min, speed 9200, power 6.0 , PI 7.8 .   Objective:    Vital Signs:   Temp:  [97.7 F (36.5 C)-98.7 F (37.1 C)] 98 F (36.7 C) (05/12 0700) Pulse Rate:  [66-83] 66 (05/12 0600) Resp:  [14-21] 17 (05/12 0600) BP: (98-121)/(69-100) 121/100 mmHg (05/12 0700) SpO2:  [98 %-100 %] 100 % (05/12 0600) Weight:  [84.4 kg (186 lb 1.1 oz)] 84.4 kg (186 lb 1.1 oz) (05/12 0500) Last BM Date: 08/28/15 Mean arterial Pressure  70-80s  Intake/Output:   Intake/Output Summary (Last 24 hours) at 08/30/15 0901 Last data filed at 08/30/15 0700  Gross per 24 hour  Intake   1740 ml  Output    925 ml  Net    815 ml     Physical Exam: General:  Lying in bed HEENT: normal Neck: supple.  Carotids 2+ bilat; no bruits. No lymphadenopathy or thryomegaly appreciated. Cor: Mechanical heart sounds with LVAD hum present. Lungs: clear  Abdomen: soft, tender, distended. No hepatosplenomegaly. No bruits or masses. Good bowel sounds. Mid epigastric Wound VAC.  Driveline: C/D/I; securement device intact and driveline incorporated Extremities: no cyanosis, clubbing, rash, edema R and LLE SCDs.  Neuro: alert & orientedx3, cranial nerves grossly intact. moves all 4 extremities w/o difficulty.sedated   Telemetry: NSR 70s   Labs: Basic Metabolic Panel:  Recent Labs Lab 08/26/15 2155  08/27/15 0410 08/28/15 0450 08/29/15 0508 08/29/15 0756 08/30/15 0459  NA  --   --  139 141 139 142 141  K  --   --  3.9 3.4* 3.3* 3.1* 4.0  CL  --   --   108 107 104 104 106  CO2  --   --  22 23 23   --  22  GLUCOSE  --   --  97 83 92 88 66  BUN  --   --  18 14 10 10 11   CREATININE  --   --  1.20 1.16 1.40* 1.00 1.43*  CALCIUM  --   < > 8.1* 8.1* 8.5*  --  8.6*  MG 1.9  --   --   --   --   --   --   < > = values in this interval not displayed.  Liver Function Tests:  Recent Labs Lab 08/27/15 1048 08/30/15 0459  AST 44* 63*  ALT 41 48  ALKPHOS 114 108  BILITOT 2.0* 5.9*  PROT 6.0* 6.6  ALBUMIN 1.7* 2.1*   No results for input(s): LIPASE, AMYLASE in the last 168 hours. No results for input(s): AMMONIA in the last 168 hours.  CBC:  Recent Labs Lab 08/26/15 2155 08/27/15 0410 08/28/15 0450 08/28/15 1600 08/29/15 0508 08/29/15 0756 08/30/15 0459  WBC 11.4* 13.3* 8.1 11.2* 11.3*  --  16.0*  NEUTROABS 9.7*  --   --   --   --   --   --   HGB 8.4* 7.8* 8.0* 9.5* 10.3* 11.6* 10.0*  HCT 25.7* 23.9* 23.9* 28.0*  30.0* 34.0* 30.0*  MCV 62.4* 60.8* 64.1* 64.1* 63.0*  --  64.4*  PLT 331 376 264 295 347  --  368    INR:  Recent Labs Lab 08/26/15 2155 08/27/15 0410 08/28/15 0450 08/29/15 0604 08/30/15 0459  INR 2.82* 2.55* 1.54* 1.37 1.57*    Other results:    Imaging: Dg Chest Port 1 View  08/30/2015  CLINICAL DATA:  Debridement of chest wound EXAM: PORTABLE CHEST 1 VIEW COMPARISON:  08/28/2015 FINDINGS: Cardiac shadow remains enlarged. An LVAD is again identified as is a defibrillator. Right-sided PICC line is again seen and stable. Postsurgical changes including atrial clipping are again noted. The lungs are well aerated bilaterally. Minimal scarring is noted in the left lung base. No focal infiltrate is seen. IMPRESSION: No acute abnormality noted. Electronically Signed   By: Inez Catalina M.D.   On: 08/30/2015 08:15     Medications:     Scheduled Medications: . allopurinol  300 mg Oral Daily  . amiodarone  100 mg Oral Daily  . citalopram  20 mg Oral Daily  . digoxin  0.125 mg Oral Daily  . docusate sodium   200 mg Oral Daily  . ferrous sulfate  325 mg Oral BID WC  . hydrALAZINE  100 mg Oral Q8H  . losartan  25 mg Oral Daily  . nafcillin IV  2 g Intravenous Q4H  . pantoprazole  40 mg Oral Daily  . pregabalin  75 mg Oral BID  . rifampin  300 mg Oral Q8H  . sildenafil  20 mg Oral TID  . sodium chloride flush  10-40 mL Intracatheter Q12H  . sodium chloride flush  3 mL Intravenous Q12H  . spironolactone  25 mg Oral Daily  . tamsulosin  0.4 mg Oral Daily  . vancomycin  125 mg Oral QID    Infusions: . dextrose 5 % and 0.9% NaCl Stopped (08/29/15 1900)  . heparin 1,300 Units/hr (08/30/15 0700)    PRN Medications: sodium chloride, bisacodyl **OR** bisacodyl, ondansetron, sodium chloride flush, sodium chloride flush, traMADol, zolpidem   Assessment:   1. LVAD Complication- Driveline abscess 2. MSSA bacteremia 3. Acute/Chronic systolic HF s/p VAD placement 9/16 4. C. Difficile colitis 5. 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 Microcytic anemia 8. Urinary retention 9. Hypokalelmia   Plan/Discussion:    S/P I&D LVAD complication. Exposed hardware. ID following. Continue po Vanc, Nafcillin, and Rifampin. All cultures NGTD. Underwent deeper debridement and wound vac replacement 5/11. No vegetations on TEE.   Suspect we will proceed with 6 weeks of abx then stop and see if re-infection recurs. Can consider long-term suppressive abx but I think if we are trying to preserve his transplant option need to make sure infection does not recur off abx. Will discuss further with Drs. Prescott Gum and Hatcher.   C. Diff much improved. Continue oral vanc.   Hemoglobin drifting down but ok. WBC up a bit.    Maps ok.   Volume status ok. Creatinine up slightly. Will not diurese further right now.   VAD parameters stable.   On heparin. Will start coumadin soon (when ok with PVT). Cannot use Eliquis because it also uses P450 system and Rifampin induces CYP 3a/4 and  would likely lead to subtherapeutic levels as well. Need coumadin so we can follow level. Pradaxa may be an option but I prefer warfarin.   I reviewed the LVAD parameters from today, and compared the results to the  patient's prior recorded data.  No programming changes were made.  The LVAD is functioning within specified parameters.  The patient performs LVAD self-test daily.  LVAD interrogation was negative for any significant power changes, alarms or PI events/speed drops.  LVAD equipment check completed and is in good working order.  Back-up equipment present.   LVAD education done on emergency procedures and precautions and reviewed exit site care.  Length of Stay: 4  Odelle Kosier MD  08/30/2015, 9:01 AM  VAD Team --- VAD ISSUES ONLY--- Pager [phone] (7am - 7am)  Advanced Heart Failure Team  Pager [phone] (M-F; 7a - 4p)  Please contact [facility] for night-coverage after hours (4p -7a ) and weekends on [email or web address]

## 2015-08-30 NOTE — Progress Notes (Signed)
ANTICOAGULATION CONSULT NOTE - Follow Up Consult  Pharmacy Consult for Heparin (currently on hold)  Indication: LVAD  Allergies  Allergen Reactions  . Phytonadione Other (See Comments)    Patient has LVAD: please check with LVAD coordinator on call or LVAD MD on call before reversal of anticoagulation with vit k    Patient Measurements: Height: 5\' 7"  (170.2 cm) Weight: 186 lb 1.1 oz (84.4 kg) IBW/kg (Calculated) : 66.1  Vital Signs: Temp: 97.8 F (36.6 C) (05/12 1100) Temp Source: Oral (05/12 1100) BP: 114/90 mmHg (05/12 1100) Pulse Rate: 75 (05/12 1100)  Labs:  Recent Labs  08/28/15 0450  08/28/15 1600  08/29/15 0508 08/29/15 0604 08/29/15 0756 08/29/15 1105 08/29/15 2241 08/30/15 0459 08/30/15 0911  HGB 8.0*  --  9.5*  --  10.3*  --  11.6*  --   --  10.0*  --   HCT 23.9*  --  28.0*  --  30.0*  --  34.0*  --   --  30.0*  --   PLT 264  --  295  --  347  --   --   --   --  368  --   APTT  --   --   --   --   --   --   --  47*  --   --   --   LABPROT 18.5*  --   --   --   --  16.9*  --   --   --  18.8*  --   INR 1.54*  --   --   --   --  1.37  --   --   --  1.57*  --   HEPARINUNFRC  --   < >  --   < >  --  0.26*  --   --  <0.10*  --  0.18*  CREATININE 1.16  --   --   --  1.40*  --  1.00  --   --  1.43*  --   < > = values in this interval not displayed.  Estimated Creatinine Clearance: 66.3 mL/min (by C-G formula based on Cr of 1.43).  Assessment: Heparin for LVAD while warfarin on hold, now POD #1 from debridement of infected pump pocket, heparin level undetectable after resuming s/p OR 5/11.  Heparin drip increased overnight to 1300 units/hr and heparin level remained low at 0.18.  Heparin increased further to 1450 units/hr, and patient began to have bleeding into wound site, VAC exchanged.    Spoke with Dr. Prescott Gum, will stop heparin until ~ 4 pm.  Then resume at a rate of 1200 units/hr. Hgb with slight drop after OR yesterday.  Goal of Therapy:  Heparin level  0.3-0.5 units/mL Monitor platelets by anticoagulation protocol: Yes   Plan:  -Hold heparin until 4 pm. -Then resume heparin at 1200 units/hr.  Do not titrate for now. -Will check heparin level 6 hrs after drip resumes just to ensure not supratherapeutic. -Daily CBC  Uvaldo Rising, BCPS  Clinical Pharmacist Pager [phone]  08/30/2015 1:09 PM

## 2015-08-30 NOTE — Progress Notes (Signed)
Pharmacy Antibiotic Note  [name] is a 45 y.o. male with HF s/p LVAD 9/16  transferred from OSH after contracting CDiff and developing MSSA abscess and drive line infection Pharmacy has been consulted for Nafcillin, Rifampin and vancomycin po dosing. WBC 13> 16, afebrile, Cr up a bit today.  S/P OR for debriedment 5/9 and plan to go back 5/11.  New culture data sent  - confirmed MSSA.    Rifmapin can decrease serum concentration of digoxin (level 0.7 4/17 upper limit so ok) and may decrease amiodarone activity.  Patient with Afib hx currently in SR - monitor  Plan: Nafcillin 2gm IV q4 Rifampin 300mg  po q8 Vancomycin 125mg  po q6   Height: 5\' 7"  (170.2 cm) Weight: 186 lb 1.1 oz (84.4 kg) IBW/kg (Calculated) : 66.1  Temp (24hrs), Avg:98.2 F (36.8 C), Min:97.7 F (36.5 C), Max:98.7 F (37.1 C)   Recent Labs Lab 08/27/15 0410 08/28/15 0450 08/28/15 1600 08/29/15 0508 08/29/15 0756 08/30/15 0459  WBC 13.3* 8.1 11.2* 11.3*  --  16.0*  CREATININE 1.20 1.16  --  1.40* 1.00 1.43*    Estimated Creatinine Clearance: 66.3 mL/min (by C-G formula based on Cr of 1.43).    Allergies  Allergen Reactions  . Phytonadione Other (See Comments)    Patient has LVAD: please check with LVAD coordinator on call or LVAD MD on call before reversal of anticoagulation with vit k    Antimicrobials this admission: 5/8 nafcillin >>  5/8 vancomycin po >>  5/9 Rifampin>>  Dose adjustments this admission:   Microbiology results: 5/2 OSH MSSA 5/2 OSH Cdiff 5/9 BCx: ngtd 5/9 Wound Cx: MSSA 5/8 MRSA PCR: neg  Uvaldo Rising, BCPS  Clinical Pharmacist Pager [phone]  08/30/2015 1:31 PM

## 2015-08-31 DIAGNOSIS — T829XXD Unspecified complication of cardiac and vascular prosthetic device, implant and graft, subsequent encounter: Secondary | ICD-10-CM

## 2015-08-31 LAB — BASIC METABOLIC PANEL
Anion gap: 10 (ref 5–15)
BUN: 10 mg/dL (ref 6–20)
CO2: 22 mmol/L (ref 22–32)
Calcium: 8.1 mg/dL — ABNORMAL LOW (ref 8.9–10.3)
Chloride: 106 mmol/L (ref 101–111)
Creatinine, Ser: 1.33 mg/dL — ABNORMAL HIGH (ref 0.61–1.24)
GFR calc Af Amer: >60 mL/min (ref >60)
GFR calc non Af Amer: >60 mL/min (ref >60)
Glucose, Bld: 77 mg/dL (ref 65–99)
Potassium: 3.5 mmol/L (ref 3.5–5.1)
Sodium: 138 mmol/L (ref 135–145)

## 2015-08-31 LAB — CBC
HCT: 25.5 % — ABNORMAL LOW (ref 39.0–52.0)
HCT: 29.2 % — ABNORMAL LOW (ref 39.0–52.0)
Hemoglobin: 8.6 g/dL — ABNORMAL LOW (ref 13.0–17.0)
Hemoglobin: 9.8 g/dL — ABNORMAL LOW (ref 13.0–17.0)
MCH: 21.9 pg — ABNORMAL LOW (ref 26.0–34.0)
MCH: 21.6 pg — ABNORMAL LOW (ref 26.0–34.0)
MCHC: 33.7 g/dL (ref 30.0–36.0)
MCHC: 33.6 g/dL (ref 30.0–36.0)
MCV: 65.1 fL — ABNORMAL LOW (ref 78.0–100.0)
MCV: 64.3 fL — AB (ref 78.0–100.0)
PLATELETS: 346 10*3/uL (ref 150–400)
Platelets: 276 10*3/uL (ref 150–400)
RBC: 3.92 MIL/uL — ABNORMAL LOW (ref 4.22–5.81)
RBC: 4.54 MIL/uL (ref 4.22–5.81)
RDW: 24.2 % — ABNORMAL HIGH (ref 11.5–15.5)
RDW: 24.6 % — AB (ref 11.5–15.5)
WBC: 9.8 10*3/uL (ref 4.0–10.5)
WBC: 14.8 10*3/uL — AB (ref 4.0–10.5)

## 2015-08-31 LAB — HEPARIN LEVEL (UNFRACTIONATED)
Heparin Unfractionated: <0.1 IU/mL — ABNORMAL LOW (ref 0.30–0.70)
Heparin Unfractionated: 0.16 IU/mL — ABNORMAL LOW (ref 0.30–0.70)

## 2015-08-31 LAB — TYPE AND SCREEN
ABO/RH(D): O POS
ANTIBODY SCREEN: NEGATIVE
Unit division: 0
Unit division: 0
Unit division: 0

## 2015-08-31 LAB — LACTATE DEHYDROGENASE: LDH: 196 U/L — ABNORMAL HIGH (ref 98–192)

## 2015-08-31 MED ORDER — MIDAZOLAM HCL 2 MG/2ML IJ SOLN
INTRAMUSCULAR | Status: AC
  Administered 2015-08-31: 2 mg
  Filled 2015-08-31: qty 4

## 2015-08-31 MED ORDER — FENTANYL CITRATE (PF) 100 MCG/2ML IJ SOLN
50.0000 ug | INTRAMUSCULAR | Status: DC | PRN
Start: 2015-08-31 — End: 2015-09-09
  Administered 2015-08-31 – 2015-09-08 (×15): 50 ug via INTRAVENOUS
  Filled 2015-08-31 (×16): qty 2

## 2015-08-31 MED ORDER — FENTANYL CITRATE (PF) 100 MCG/2ML IJ SOLN: 50.0000 ug | Freq: Once | INTRAMUSCULAR | Status: DC

## 2015-08-31 MED ORDER — LOSARTAN POTASSIUM 50 MG PO TABS
50.0000 mg | ORAL_TABLET | Freq: Every day | ORAL | Status: DC
Start: 2015-08-31 — End: 2015-09-01
  Administered 2015-08-31: 50 mg via ORAL
  Filled 2015-08-31: qty 1

## 2015-08-31 MED ORDER — MIDAZOLAM HCL 2 MG/2ML IJ SOLN: 2.0000 mg | INTRAMUSCULAR | Status: DC | PRN

## 2015-08-31 MED ORDER — POLYETHYLENE GLYCOL 3350 17 G PO PACK: 17.0000 g | PACK | Freq: Once | ORAL | Status: DC

## 2015-08-31 MED ORDER — POTASSIUM CHLORIDE 10 MEQ/50ML IV SOLN
10.0000 meq | INTRAVENOUS | Status: AC
  Administered 2015-08-31 (×2): 10 meq via INTRAVENOUS
  Filled 2015-08-31 (×2): qty 50

## 2015-08-31 MED ORDER — MIDAZOLAM HCL 2 MG/2ML IJ SOLN: 2.0000 mg | Freq: Once | INTRAMUSCULAR | Status: DC

## 2015-08-31 MED ORDER — OXYCODONE HCL 5 MG PO TABS
5.0000 mg | ORAL_TABLET | ORAL | Status: DC | PRN
  Administered 2015-08-31 – 2015-09-21 (×19): 5 mg via ORAL
  Filled 2015-08-31 (×21): qty 1

## 2015-08-31 NOTE — Progress Notes (Signed)
Patient ID: TERI UVA, male   DOB: August 09, 1970, 45 y.o.   MRN: [id number]    HeartMate 2 Rounding Note  Subjective:    Admitted from [facility] in [place] with driveline abscess. Blood cultures --> MSSA and has C diff. Remains on Nafcillin  and oral vancomycin.   08/27/15 S/P I &D Epigastric with hardware exposed and VAC placement.  5/11 repeat I&D and wound vac replacement.  5/11 TEE no vegetations on valves or ICD wire  Wound vac in place.  Mild soreness at site but overall feels fine.  "Little wobbly" with walk yesterday.  MAPs higher, 90s-100s.  Hemoglobin at bit lower at 8.6 today.  LVAD INTERROGATION:  HeartMate II LVAD:  Flow 5.4 liters/min, speed 9200, power 5.5, PI 7.7. No PI events.   Objective:    Vital Signs:   Temp:  [97.8 F (36.6 C)-98.5 F (36.9 C)] 98 F (36.7 C) (05/13 0400) Pulse Rate:  [68-82] 71 (05/13 0700) Resp:  [13-26] 17 (05/13 0700) BP: (97-125)/(56-100) 119/96 mmHg (05/13 0700) SpO2:  [98 %-100 %] 100 % (05/13 0700) Weight:  [187 lb 6.3 oz (85 kg)] 187 lb 6.3 oz (85 kg) (05/13 0600) Last BM Date: 08/30/15 Mean arterial Pressure  90s-100s  Intake/Output:   Intake/Output Summary (Last 24 hours) at 08/31/15 0802 Last data filed at 08/31/15 0600  Gross per 24 hour  Intake 1133.5 ml  Output    675 ml  Net  458.5 ml     Physical Exam: General:  Lying in bed HEENT: normal Neck: supple.  Carotids 2+ bilat; no bruits. No lymphadenopathy or thryomegaly appreciated. Cor: Mechanical heart sounds with LVAD hum present. Lungs: clear  Abdomen: soft, tender, distended. No hepatosplenomegaly. No bruits or masses. Good bowel sounds. Mid epigastric Wound VAC.  Driveline: C/D/I; securement device intact and driveline incorporated Extremities: no cyanosis, clubbing, rash, edema R and LLE SCDs.  Neuro: alert & orientedx3, cranial nerves grossly intact. moves all 4 extremities w/o difficulty.sedated   Telemetry: NSR 70s   Labs: Basic  Metabolic Panel:  Recent Labs Lab 08/26/15 2155  08/27/15 0410 08/28/15 0450 08/29/15 0508 08/29/15 0756 08/30/15 0459 08/31/15 0427  NA  --   < > 139 141 139 142 141 138  K  --   < > 3.9 3.4* 3.3* 3.1* 4.0 3.5  CL  --   < > 108 107 104 104 106 106  CO2  --   --  22 23 23   --  22 22  GLUCOSE  --   < > 97 83 92 88 66 77  BUN  --   < > 18 14 10 10 11 10   CREATININE  --   < > 1.20 1.16 1.40* 1.00 1.43* 1.33*  CALCIUM  --   < > 8.1* 8.1* 8.5*  --  8.6* 8.1*  MG 1.9  --   --   --   --   --   --   --   < > = values in this interval not displayed.  Liver Function Tests:  Recent Labs Lab 08/27/15 1048 08/30/15 0459  AST 44* 63*  ALT 41 48  ALKPHOS 114 108  BILITOT 2.0* 5.9*  PROT 6.0* 6.6  ALBUMIN 1.7* 2.1*   No results for input(s): LIPASE, AMYLASE in the last 168 hours. No results for input(s): AMMONIA in the last 168 hours.  CBC:  Recent Labs Lab 08/26/15 2155  08/28/15 0450 08/28/15 1600 08/29/15 [id number] 08/29/15 0756 08/30/15 [id number]  08/31/15 0427  WBC 11.4*  < > 8.1 11.2* 11.3*  --  16.0* 9.8  NEUTROABS 9.7*  --   --   --   --   --   --   --   HGB 8.4*  < > 8.0* 9.5* 10.3* 11.6* 10.0* 8.6*  HCT 25.7*  < > 23.9* 28.0* 30.0* 34.0* 30.0* 25.5*  MCV 62.4*  < > 64.1* 64.1* 63.0*  --  64.4* 65.1*  PLT 331  < > 264 295 347  --  368 276  < > = values in this interval not displayed.  INR:  Recent Labs Lab 08/26/15 2155 08/27/15 0410 08/28/15 0450 08/29/15 0604 08/30/15 0459  INR 2.82* 2.55* 1.54* 1.37 1.57*    Other results:    Imaging: Dg Chest Port 1 View  08/30/2015  CLINICAL DATA:  Debridement of chest wound EXAM: PORTABLE CHEST 1 VIEW COMPARISON:  08/28/2015 FINDINGS: Cardiac shadow remains enlarged. An LVAD is again identified as is a defibrillator. Right-sided PICC line is again seen and stable. Postsurgical changes including atrial clipping are again noted. The lungs are well aerated bilaterally. Minimal scarring is noted in the left lung base. No  focal infiltrate is seen. IMPRESSION: No acute abnormality noted. Electronically Signed   By: Inez Catalina M.D.   On: 08/30/2015 08:15     Medications:     Scheduled Medications: . allopurinol  300 mg Oral Daily  . amiodarone  100 mg Oral Daily  . citalopram  20 mg Oral Daily  . digoxin  0.125 mg Oral Daily  . docusate sodium  200 mg Oral Daily  . fentaNYL (SUBLIMAZE) injection  50 mcg Intravenous Once  . ferrous sulfate  325 mg Oral BID WC  . hydrALAZINE  100 mg Oral Q8H  . losartan  50 mg Oral Daily  . nafcillin IV  2 g Intravenous Q4H  . pantoprazole  40 mg Oral Daily  . potassium chloride  10 mEq Intravenous Q1 Hr x 2  . pregabalin  75 mg Oral BID  . rifampin  300 mg Oral Q8H  . sildenafil  20 mg Oral TID  . sodium chloride flush  10-40 mL Intracatheter Q12H  . sodium chloride flush  3 mL Intravenous Q12H  . spironolactone  25 mg Oral Daily  . tamsulosin  0.4 mg Oral Daily  . vancomycin  125 mg Oral QID    Infusions: . dextrose 5 % and 0.9% NaCl 10 mL/hr at 08/31/15 0300  . heparin 1,200 Units/hr (08/31/15 0600)    PRN Medications: sodium chloride, bisacodyl **OR** bisacodyl, fentaNYL (SUBLIMAZE) injection, ondansetron, sodium chloride flush, sodium chloride flush, traMADol, zolpidem   Assessment:   1. LVAD Complication- Driveline abscess 2. MSSA bacteremia 3. Acute/Chronic systolic HF s/p VAD placement 9/16 4. C. Difficile colitis 5. 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 Microcytic anemia 8. Urinary retention 9. Hypokalelmia 10. HTN   Plan/Discussion:    S/P I&D LVAD driveline abscess. Exposed hardware. ID following. Continue po Vanc for C diff, Nafcillin, and Rifampin. All blood cultures NGTD. Underwent deeper debridement and wound vac replacement 5/11. No vegetations on TEE. He will need to return to the OR on Monday for wound vac change and wound exam by plastics (possible muscle flap coverage).  Will therefore keep off  coumadin for now, covering with heparin gtt.   Suspect we will proceed with 6 weeks of abx then stop and see if re-infection recurs. Can consider  long-term suppressive abx but think if we are trying to preserve his transplant option need to make sure infection does not recur off abx.   C. Diff much improved, solid stool now. Continue oral vanc.   Hemoglobin drifting down at 8.6.  Repeat pm CBC.     MAP high, increase losartan to 50 mg daily.   Volume status ok. Creatinine lower.    VAD parameters stable.   On heparin. Will start coumadin after he is finished with the OR (returns on Monday). Cannot use Eliquis because it also uses P450 system and Rifampin induces CYP 3a/4 and would likely lead to subtherapeutic levels as well.  Need coumadin so we can follow level.   I reviewed the LVAD parameters from today, and compared the results to the patient's prior recorded data.  No programming changes were made.  The LVAD is functioning within specified parameters.  The patient performs LVAD self-test daily.  LVAD interrogation was negative for any significant power changes, alarms or PI events/speed drops.  LVAD equipment check completed and is in good working order.  Back-up equipment present.   LVAD education done on emergency procedures and precautions and reviewed exit site care.  Length of Stay: 5  Loralie Champagne MD  08/31/2015, 8:02 AM  VAD Team --- VAD ISSUES ONLY--- Pager [phone] (7am - 7am)  Advanced Heart Failure Team  Pager [phone] (M-F; 7a - 4p)  Please contact [facility] for night-coverage after hours (4p -7a ) and weekends on [email or web address]

## 2015-08-31 NOTE — Progress Notes (Signed)
2 Days Post-Op Procedure(s) (LRB): DEBRIDEMENT OF chest wound (N/A) WOUND VAC CHANGE (N/A) Subjective: Bleeding from open wound-VAD pocket debridement after heparin has been started. Wound VAC sponge keeps clotting off and has been removed Will change to daily wet-to-dry dressings with IV sedation at the bedside Hemoglobin slightly lower Cannot increase heparin dosing greater than 1200 units per hour because of bleeding from wound Patient to return to the OR for intraoperative exam by Dr. Marla Roe And possible muscle flap coverage Objective: Vital signs in last 24 hours: Temp:  [97.9 F (36.6 C)-98.3 F (36.8 C)] 98.3 F (36.8 C) (05/13 1317) Pulse Rate:  [68-82] 74 (05/13 1400) Cardiac Rhythm:  [-] Normal sinus rhythm (05/13 1600) Resp:  [13-26] 17 (05/13 1600) BP: (97-124)/(68-104) 122/98 mmHg (05/13 1600) SpO2:  [99 %-100 %] 100 % (05/13 1500) Weight:  [187 lb 6.3 oz (85 kg)] 187 lb 6.3 oz (85 kg) (05/13 0600)  Hemodynamic parameters for last 24 hours:  stable  Intake/Output from previous day: 05/12 0701 - 05/13 0700 In: 1551.5 [P.O.:480; I.V.:471.5; IV Piggyback:600] Out: 675 [Urine:600; Drains:75] Intake/Output this shift: Total I/O In: 923 [P.O.:300; I.V.:223; IV Piggyback:400] Out: 601 [Urine:600; Stool:1]       Exam    General- alert and comfortable   Lungs- clear without rales, wheezes   Cor- regular rate and rhythm, no murmur , gallop   Abdomen- soft, non-tender   Extremities - warm, non-tender, minimal edema   Neuro- oriented, appropriate, no focal weakness   Lab Results:  Recent Labs  08/31/15 0427 08/31/15 1510  WBC 9.8 14.8*  HGB 8.6* 9.8*  HCT 25.5* 29.2*  PLT 276 346   BMET:  Recent Labs  08/30/15 0459 08/31/15 0427  NA 141 138  K 4.0 3.5  CL 106 106  CO2 22 22  GLUCOSE 66 77  BUN 11 10  CREATININE 1.43* 1.33*  CALCIUM 8.6* 8.1*    PT/INR:  Recent Labs  08/30/15 0459  LABPROT 18.8*  INR 1.57*   ABG    Component Value  Date/Time   PHART 7.386 08/29/2015 0759   HCO3 25.2* 08/29/2015 0759   TCO2 26 08/29/2015 0759   ACIDBASEDEF 0.3 12/23/2014 0335   O2SAT 100.0 08/29/2015 0759   CBG (last 3)  No results for input(s): GLUCAP in the last 72 hours.  Assessment/Plan: S/P Procedure(s) (LRB): DEBRIDEMENT OF chest wound (N/A) WOUND VAC CHANGE (N/A) Continue daily wound care-dressing changes Hold Coumadin Do not increase heparin greater than 1200 units per hour Stop heparin Monday 4 hours before scheduled surgery   LOS: 5 days    [name] 08/31/2015

## 2015-08-31 NOTE — Progress Notes (Signed)
ANTICOAGULATION CONSULT NOTE - Follow Up Consult  Pharmacy Consult for Heparin (currently held at 1200 units/hr) Indication: LVAD  Allergies  Allergen Reactions  . Phytonadione Other (See Comments)    Patient has LVAD: please check with LVAD coordinator on call or LVAD MD on call before reversal of anticoagulation with vit k    Patient Measurements: Height: 5\' 7"  (170.2 cm) Weight: 187 lb 6.3 oz (85 kg) IBW/kg (Calculated) : 66.1  Vital Signs: Temp: 98.3 F (36.8 C) (05/13 1317) Temp Source: Oral (05/13 1317) BP: 122/98 mmHg (05/13 1600) Pulse Rate: 74 (05/13 1400)  Labs:  Recent Labs  08/29/15 0604 08/29/15 0756 08/29/15 1105  08/30/15 0459 08/30/15 0911 08/30/15 2259 08/31/15 0427 08/31/15 1510  HGB  --  11.6*  --   --  10.0*  --   --  8.6* 9.8*  HCT  --  34.0*  --   --  30.0*  --   --  25.5* 29.2*  PLT  --   --   --   --  368  --   --  276 346  APTT  --   --  47*  --   --   --   --   --   --   LABPROT 16.9*  --   --   --  18.8*  --   --   --   --   INR 1.37  --   --   --  1.57*  --   --   --   --   HEPARINUNFRC 0.26*  --   --   < >  --  0.18* <0.10*  --  0.16*  CREATININE  --  1.00  --   --  1.43*  --   --  1.33*  --   < > = values in this interval not displayed.  Estimated Creatinine Clearance: 71.6 mL/min (by C-G formula based on Cr of 1.33).  Assessment: Heparin for LVAD while warfarin on hold, now POD #2 from debridement of infected pump pocket, heparin level undetectable after resuming s/p OR 5/11.  Heparin drip increased overnight to 1300 units/hr and heparin level remained low at 0.18.  Heparin increased further to 1450 units/hr, and patient began to have bleeding into wound site, VAC exchanged.    Spoke with Dr. Prescott Gum, heparin resumed yesterday. Heparin level remains low on heparin 1200 units/hr.  Per RN wound VAC not working again, Dr. Prescott Gum coming to examine.  Goal of Therapy:  Heparin level 0.3-0.5 units/mL Monitor platelets by  anticoagulation protocol: Yes   Plan:  -Continue heparin at 1200 units/hr.  Do not titrate for now. -Daily CBC/heparin level.  Uvaldo Rising, BCPS  Clinical Pharmacist Pager [phone]  08/31/2015 4:17 PM

## 2015-09-01 LAB — CBC
HCT: 24.5 % — ABNORMAL LOW (ref 39.0–52.0)
HEMATOCRIT: 23.2 % — AB (ref 39.0–52.0)
Hemoglobin: 8.3 g/dL — ABNORMAL LOW (ref 13.0–17.0)
Hemoglobin: 8 g/dL — ABNORMAL LOW (ref 13.0–17.0)
MCH: 21.9 pg — ABNORMAL LOW (ref 26.0–34.0)
MCH: 22.6 pg — ABNORMAL LOW (ref 26.0–34.0)
MCHC: 33.9 g/dL (ref 30.0–36.0)
MCHC: 34.5 g/dL (ref 30.0–36.0)
MCV: 64.6 fL — ABNORMAL LOW (ref 78.0–100.0)
MCV: 65.5 fL — ABNORMAL LOW (ref 78.0–100.0)
PLATELETS: 274 10*3/uL (ref 150–400)
Platelets: 267 10*3/uL (ref 150–400)
RBC: 3.79 MIL/uL — ABNORMAL LOW (ref 4.22–5.81)
RBC: 3.54 MIL/uL — ABNORMAL LOW (ref 4.22–5.81)
RDW: 25.2 % — ABNORMAL HIGH (ref 11.5–15.5)
RDW: 25.4 % — AB (ref 11.5–15.5)
WBC: 12.1 10*3/uL — ABNORMAL HIGH (ref 4.0–10.5)
WBC: 12.4 10*3/uL — AB (ref 4.0–10.5)

## 2015-09-01 LAB — CULTURE, BLOOD (ROUTINE X 2)
CULTURE: NO GROWTH
Culture: NO GROWTH

## 2015-09-01 LAB — BASIC METABOLIC PANEL
ANION GAP: 10 (ref 5–15)
BUN: <5 mg/dL — ABNORMAL LOW (ref 6–20)
CALCIUM: 8.2 mg/dL — AB (ref 8.9–10.3)
CHLORIDE: 105 mmol/L (ref 101–111)
CO2: 23 mmol/L (ref 22–32)
CREATININE: 1.26 mg/dL — AB (ref 0.61–1.24)
GFR calc non Af Amer: >60 mL/min (ref >60)
Glucose, Bld: 73 mg/dL (ref 65–99)
Potassium: 3.3 mmol/L — ABNORMAL LOW (ref 3.5–5.1)
SODIUM: 138 mmol/L (ref 135–145)

## 2015-09-01 LAB — POCT I-STAT, CHEM 8
BUN: 9 mg/dL (ref 6–20)
CALCIUM ION: 1.13 mmol/L (ref 1.12–1.23)
CREATININE: 1.4 mg/dL — AB (ref 0.61–1.24)
Chloride: 104 mmol/L (ref 101–111)
Glucose, Bld: 103 mg/dL — ABNORMAL HIGH (ref 65–99)
HEMATOCRIT: 31 % — AB (ref 39.0–52.0)
HEMOGLOBIN: 10.5 g/dL — AB (ref 13.0–17.0)
Potassium: 3.6 mmol/L (ref 3.5–5.1)
SODIUM: 139 mmol/L (ref 135–145)
TCO2: 22 mmol/L (ref 0–100)

## 2015-09-01 LAB — ANAEROBIC CULTURE: Gram Stain: NONE SEEN

## 2015-09-01 LAB — LACTATE DEHYDROGENASE: LDH: 181 U/L (ref 98–192)

## 2015-09-01 LAB — PROTIME-INR
INR: 1.54 — AB (ref 0.00–1.49)
PROTHROMBIN TIME: 18.6 s — AB (ref 11.6–15.2)

## 2015-09-01 LAB — PREPARE RBC (CROSSMATCH)

## 2015-09-01 LAB — HEPARIN LEVEL (UNFRACTIONATED): HEPARIN UNFRACTIONATED: 0.14 [IU]/mL — AB (ref 0.30–0.70)

## 2015-09-01 MED ORDER — POTASSIUM CHLORIDE CRYS ER 20 MEQ PO TBCR
40.0000 meq | EXTENDED_RELEASE_TABLET | Freq: Once | ORAL | Status: AC
  Administered 2015-09-01: 40 meq via ORAL
  Filled 2015-09-01: qty 2

## 2015-09-01 MED ORDER — ALBUMIN HUMAN 5 % IV SOLN
12.5000 g | Freq: Once | INTRAVENOUS | Status: AC
  Administered 2015-09-01: 12.5 g via INTRAVENOUS
  Filled 2015-09-01: qty 250

## 2015-09-01 MED ORDER — SODIUM CHLORIDE 0.9 % IV SOLN
Freq: Once | INTRAVENOUS | Status: AC
  Administered 2015-09-01: 21:00:00 via INTRAVENOUS

## 2015-09-01 MED ORDER — DEXTROSE 5 % AND 0.9 % NACL IV BOLUS: 50.0000 mL | INTRAVENOUS | Status: DC

## 2015-09-01 MED ORDER — SACUBITRIL-VALSARTAN 24-26 MG PO TABS
1.0000 | ORAL_TABLET | Freq: Two times a day (BID) | ORAL | Status: DC
  Administered 2015-09-01: 1 via ORAL
  Filled 2015-09-01 (×2): qty 1

## 2015-09-01 NOTE — Progress Notes (Signed)
Patient ID: [name], male   DOB: Dec 16, 1970, 45 y.o.   MRN: [id number]    HeartMate 2 Rounding Note  Subjective:    Admitted from [facility] in [place] with driveline abscess. Blood cultures --> MSSA and has C diff. Remains on Nafcillin  and oral vancomycin.   08/27/15 S/P I &D Epigastric with hardware exposed and VAC placement.  5/11 repeat I&D and wound vac replacement.  5/11 TEE no vegetations on valves or ICD wire  Wound vac in place.  Daily wet to dry dressings.  Mild soreness at site but overall feels fine.  MAP remains in 90s.  Hgb 8.6 => 8.3.   Having semi-solid stools.   LVAD INTERROGATION:  HeartMate II LVAD:  Flow 5.2 liters/min, speed 9200, power 5.5, PI 7. No PI events.   Objective:    Vital Signs:   Temp:  [97.8 F (36.6 C)-98.3 F (36.8 C)] 97.8 F (36.6 C) (05/14 0400) Pulse Rate:  [68-74] 72 (05/14 0700) Resp:  [11-20] 15 (05/14 0700) BP: (100-124)/(68-104) 103/88 mmHg (05/14 0700) SpO2:  [98 %-100 %] 100 % (05/14 0700) Weight:  [190 lb 7.6 oz (86.4 kg)] 190 lb 7.6 oz (86.4 kg) (05/14 0500) Last BM Date: 08/31/15 Mean arterial Pressure  90s-100s  Intake/Output:   Intake/Output Summary (Last 24 hours) at 09/01/15 0809 Last data filed at 09/01/15 0700  Gross per 24 hour  Intake   1505 ml  Output    601 ml  Net    904 ml     Physical Exam: General:  Lying in bed HEENT: normal Neck: supple.  Carotids 2+ bilat; no bruits. No lymphadenopathy or thryomegaly appreciated. Cor: Mechanical heart sounds with LVAD hum present. Lungs: clear  Abdomen: soft, tender, distended. No hepatosplenomegaly. No bruits or masses. Good bowel sounds. Mid epigastric Wound VAC.  Driveline: C/D/I; securement device intact and driveline incorporated Extremities: no cyanosis, clubbing, rash, edema R and LLE SCDs.  Neuro: alert & orientedx3, cranial nerves grossly intact. moves all 4 extremities w/o difficulty.sedated   Telemetry: NSR 70s   Labs: Basic  Metabolic Panel:  Recent Labs Lab 08/26/15 2155  08/28/15 0450 08/29/15 0508 08/29/15 0756 08/30/15 0459 08/31/15 0427 09/01/15 0442  NA  --   < > 141 139 142 141 138 138  K  --   < > 3.4* 3.3* 3.1* 4.0 3.5 3.3*  CL  --   < > 107 104 104 106 106 105  CO2  --   < > 23 23  --  22 22 23   GLUCOSE  --   < > 83 92 88 66 77 73  BUN  --   < > 14 10 10 11 10  <5*  CREATININE  --   < > 1.16 1.40* 1.00 1.43* 1.33* 1.26*  CALCIUM  --   < > 8.1* 8.5*  --  8.6* 8.1* 8.2*  MG 1.9  --   --   --   --   --   --   --   < > = values in this interval not displayed.  Liver Function Tests:  Recent Labs Lab 08/27/15 1048 08/30/15 0459  AST 44* 63*  ALT 41 48  ALKPHOS 114 108  BILITOT 2.0* 5.9*  PROT 6.0* 6.6  ALBUMIN 1.7* 2.1*   No results for input(s): LIPASE, AMYLASE in the last 168 hours. No results for input(s): AMMONIA in the last 168 hours.  CBC:  Recent Labs Lab 08/26/15 2155  08/29/15 [id number]  08/29/15 0756 08/30/15 0459 08/31/15 0427 08/31/15 1510 09/01/15 0442  WBC 11.4*  < > 11.3*  --  16.0* 9.8 14.8* 12.1*  NEUTROABS 9.7*  --   --   --   --   --   --   --   HGB 8.4*  < > 10.3* 11.6* 10.0* 8.6* 9.8* 8.3*  HCT 25.7*  < > 30.0* 34.0* 30.0* 25.5* 29.2* 24.5*  MCV 62.4*  < > 63.0*  --  64.4* 65.1* 64.3* 64.6*  PLT 331  < > 347  --  368 276 346 267  < > = values in this interval not displayed.  INR:  Recent Labs Lab 08/27/15 0410 08/28/15 0450 08/29/15 0604 08/30/15 0459 09/01/15 0451  INR 2.55* 1.54* 1.37 1.57* 1.54*    Other results:    Imaging: No results found.   Medications:     Scheduled Medications: . allopurinol  300 mg Oral Daily  . amiodarone  100 mg Oral Daily  . citalopram  20 mg Oral Daily  . digoxin  0.125 mg Oral Daily  . docusate sodium  200 mg Oral Daily  . ferrous sulfate  325 mg Oral BID WC  . hydrALAZINE  100 mg Oral Q8H  . nafcillin IV  2 g Intravenous Q4H  . pantoprazole  40 mg Oral Daily  . polyethylene glycol  17 g Oral Once    . potassium chloride  40 mEq Oral Once  . pregabalin  75 mg Oral BID  . rifampin  300 mg Oral Q8H  . sacubitril-valsartan  1 tablet Oral BID  . sildenafil  20 mg Oral TID  . sodium chloride flush  10-40 mL Intracatheter Q12H  . sodium chloride flush  3 mL Intravenous Q12H  . spironolactone  25 mg Oral Daily  . tamsulosin  0.4 mg Oral Daily  . vancomycin  125 mg Oral QID    Infusions: . dextrose 5 % and 0.9% NaCl 10 mL/hr at 09/01/15 0500  . heparin 1,200 Units/hr (09/01/15 0700)    PRN Medications: sodium chloride, bisacodyl **OR** bisacodyl, fentaNYL (SUBLIMAZE) injection, ondansetron, oxyCODONE, sodium chloride flush, sodium chloride flush, traMADol, zolpidem   Assessment:   1. LVAD Complication- Driveline abscess 2. MSSA bacteremia 3. Acute/Chronic systolic HF s/p VAD placement 9/16 4. C. Difficile colitis 5. 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 Microcytic anemia 8. Urinary retention 9. Hypokalelmia 10. HTN 11. Anemia   Plan/Discussion:    S/P I&D LVAD driveline abscess. Exposed hardware. ID following. Continue po Vanc for C diff, Nafcillin, and Rifampin. All blood cultures NGTD. Underwent deeper debridement and wound vac replacement 5/11. No vegetations on TEE. He will need to return to the OR on Monday for wound vac change and wound exam by plastics (possible muscle flap coverage).  Will therefore keep off coumadin for now, covering with heparin gtt. Keep heparin gtt no higher than 1200/hr b/c of bleeding from wound.   Suspect we will proceed with 6 weeks of abx then stop and see if re-infection recurs. Can consider long-term suppressive abx but think if we are trying to preserve his transplant option need to make sure infection does not recur off abx.   C. Diff much improved, semi-solid stool now. Continue oral vanc.   Hemoglobin drifting down at 8.6 => 8.3.  Some oozing from wound site yesterday.  Transfuse hgb < 8.  Repeat pm  CBC.     MAP high, stop losartan and will  use Entresto 24/26 bid.    Volume status ok. Creatinine lower.    VAD parameters stable.   On heparin. Will start coumadin after he is finished with the OR (returns on Monday). Cannot use Eliquis because it also uses P450 system and Rifampin induces CYP 3a/4 and would likely lead to subtherapeutic levels as well.  Need coumadin so we can follow level.   I reviewed the LVAD parameters from today, and compared the results to the patient's prior recorded data.  No programming changes were made.  The LVAD is functioning within specified parameters.  The patient performs LVAD self-test daily.  LVAD interrogation was negative for any significant power changes, alarms or PI events/speed drops.  LVAD equipment check completed and is in good working order.  Back-up equipment present.   LVAD education done on emergency procedures and precautions and reviewed exit site care.  Length of Stay: 6  Loralie Champagne MD  09/01/2015, 8:09 AM  VAD Team --- VAD ISSUES ONLY--- Pager [phone] (7am - 7am)  Advanced Heart Failure Team  Pager [phone] (M-F; 7a - 4p)  Please contact [facility] for night-coverage after hours (4p -7a ) and weekends on [email or web address]

## 2015-09-01 NOTE — Progress Notes (Addendum)
Called Dr. Darcey Nora to update on pt, MAPs 60s, PI running 5.0-6, no urine output or PO intake today. Orders received to discontinue Entresto, give 1 Albumin, give 1 unit PRBCs, check Chem 8 for Hgb level, and bladder scan pt and insert foley if unable to void and scan is >500cc, also to increase continuous D5NS to 30mL/hr. Orders implemented, see documentation and lab results.  Wyline Beady

## 2015-09-01 NOTE — Progress Notes (Signed)
3 Days Post-Op Procedure(s) (LRB): DEBRIDEMENT OF chest wound (N/A) WOUND VAC CHANGE (N/A) Subjective: Continues to bleed on IV heparin from epigastric wound Wound dressing personally changed and remains clean with good granulation tissue We'll hold heparin for 2 hours and resume at maximum rate of 1000 units per hour Plan is for wound exam under anesthesia by Dr. Marla Roe sometime next week and consideration of muscle flap coverage Coumadin on hold, LDH stable and pump parameters are normal Objective: Vital signs in last 24 hours: Temp:  [97.8 F (36.6 C)-98.3 F (36.8 C)] 97.8 F (36.6 C) (05/14 0400) Pulse Rate:  [68-74] 72 (05/14 0700) Cardiac Rhythm:  [-] Normal sinus rhythm (05/14 0800) Resp:  [11-20] 15 (05/14 0700) BP: (100-124)/(68-104) 103/88 mmHg (05/14 0700) SpO2:  [98 %-100 %] 100 % (05/14 0700) Weight:  [190 lb 7.6 oz (86.4 kg)] 190 lb 7.6 oz (86.4 kg) (05/14 0500)  Hemodynamic parameters for last 24 hours:  stable  Intake/Output from previous day: 05/13 0701 - 05/14 0700 In: 1515 [P.O.:300; I.V.:515; IV Piggyback:700] Out: 601 [Urine:600; Stool:1] Intake/Output this shift: Total I/O In: 122 [I.V.:22; IV Piggyback:100] Out: -   Abdomen slightly distended but nontender Minimal edema Lungs clear  Lab Results:  Recent Labs  08/31/15 1510 09/01/15 0442  WBC 14.8* 12.1*  HGB 9.8* 8.3*  HCT 29.2* 24.5*  PLT 346 267   BMET:  Recent Labs  08/31/15 0427 09/01/15 0442  NA 138 138  K 3.5 3.3*  CL 106 105  CO2 22 23  GLUCOSE 77 73  BUN 10 <5*  CREATININE 1.33* 1.26*  CALCIUM 8.1* 8.2*    PT/INR:  Recent Labs  09/01/15 0451  LABPROT 18.6*  INR 1.54*   ABG    Component Value Date/Time   PHART 7.386 08/29/2015 0759   HCO3 25.2* 08/29/2015 0759   TCO2 26 08/29/2015 0759   ACIDBASEDEF 0.3 12/23/2014 0335   O2SAT 100.0 08/29/2015 0759   CBG (last 3)  No results for input(s): GLUCAP in the last 72 hours.  Assessment/Plan: S/P  Procedure(s) (LRB): DEBRIDEMENT OF chest wound (N/A) WOUND VAC CHANGE (N/A) Continue low-dose heparin as tolerated Hemoglobin continues to drop now 8.3 Continue by mouth iron and daily CBC   LOS: 6 days    Tharon Aquas Trigt III 09/01/2015

## 2015-09-02 ENCOUNTER — Inpatient Hospital Stay (HOSPITAL_COMMUNITY): Payer: 59

## 2015-09-02 ENCOUNTER — Encounter (HOSPITAL_COMMUNITY): Payer: Self-pay | Admitting: Certified Registered Nurse Anesthetist

## 2015-09-02 DIAGNOSIS — R6521 Severe sepsis with septic shock: Secondary | ICD-10-CM

## 2015-09-02 DIAGNOSIS — A419 Sepsis, unspecified organism: Secondary | ICD-10-CM

## 2015-09-02 DIAGNOSIS — R7881 Bacteremia: Secondary | ICD-10-CM

## 2015-09-02 DIAGNOSIS — I959 Hypotension, unspecified: Secondary | ICD-10-CM

## 2015-09-02 LAB — CBC
HCT: 27.7 % — ABNORMAL LOW (ref 39.0–52.0)
HCT: 23.5 % — ABNORMAL LOW (ref 39.0–52.0)
HCT: 28.6 % — ABNORMAL LOW (ref 39.0–52.0)
HEMOGLOBIN: 8 g/dL — AB (ref 13.0–17.0)
Hemoglobin: 9.3 g/dL — ABNORMAL LOW (ref 13.0–17.0)
Hemoglobin: 9.6 g/dL — ABNORMAL LOW (ref 13.0–17.0)
MCH: 22.4 pg — ABNORMAL LOW (ref 26.0–34.0)
MCH: 22.4 pg — ABNORMAL LOW (ref 26.0–34.0)
MCH: 23.3 pg — ABNORMAL LOW (ref 26.0–34.0)
MCHC: 33.6 g/dL (ref 30.0–36.0)
MCHC: 34 g/dL (ref 30.0–36.0)
MCHC: 33.6 g/dL (ref 30.0–36.0)
MCV: 66.6 fL — ABNORMAL LOW (ref 78.0–100.0)
MCV: 65.8 fL — ABNORMAL LOW (ref 78.0–100.0)
MCV: 69.4 fL — ABNORMAL LOW (ref 78.0–100.0)
PLATELETS: 357 10*3/uL (ref 150–400)
Platelets: 377 10*3/uL (ref 150–400)
Platelets: 295 10*3/uL (ref 150–400)
RBC: 4.16 MIL/uL — ABNORMAL LOW (ref 4.22–5.81)
RBC: 3.57 MIL/uL — ABNORMAL LOW (ref 4.22–5.81)
RBC: 4.12 MIL/uL — ABNORMAL LOW (ref 4.22–5.81)
RDW: 26.7 % — ABNORMAL HIGH (ref 11.5–15.5)
RDW: 26.5 % — AB (ref 11.5–15.5)
RDW: 26.6 % — ABNORMAL HIGH (ref 11.5–15.5)
WBC: 19.4 10*3/uL — ABNORMAL HIGH (ref 4.0–10.5)
WBC: 23.3 10*3/uL — ABNORMAL HIGH (ref 4.0–10.5)
WBC: 20.8 10*3/uL — ABNORMAL HIGH (ref 4.0–10.5)

## 2015-09-02 LAB — HEPATIC FUNCTION PANEL
ALT: 31 U/L (ref 17–63)
AST: 37 U/L (ref 15–41)
Albumin: 2.1 g/dL — ABNORMAL LOW (ref 3.5–5.0)
Alkaline Phosphatase: 89 U/L (ref 38–126)
BILIRUBIN DIRECT: 5.9 mg/dL — AB (ref 0.1–0.5)
BILIRUBIN INDIRECT: 3.3 mg/dL — AB (ref 0.3–0.9)
BILIRUBIN TOTAL: 9.2 mg/dL — AB (ref 0.3–1.2)
Total Protein: 5.5 g/dL — ABNORMAL LOW (ref 6.5–8.1)

## 2015-09-02 LAB — BASIC METABOLIC PANEL
Anion gap: 9 (ref 5–15)
BUN: 10 mg/dL (ref 6–20)
CALCIUM: 7.8 mg/dL — AB (ref 8.9–10.3)
CO2: 21 mmol/L — ABNORMAL LOW (ref 22–32)
CREATININE: 2.01 mg/dL — AB (ref 0.61–1.24)
Chloride: 105 mmol/L (ref 101–111)
GFR calc Af Amer: 44 mL/min — ABNORMAL LOW (ref >60)
GFR, EST NON AFRICAN AMERICAN: 38 mL/min — AB (ref >60)
Glucose, Bld: 116 mg/dL — ABNORMAL HIGH (ref 65–99)
Potassium: 3.7 mmol/L (ref 3.5–5.1)
SODIUM: 135 mmol/L (ref 135–145)

## 2015-09-02 LAB — DIGOXIN LEVEL: DIGOXIN LVL: 1.4 ng/mL (ref 0.8–2.0)

## 2015-09-02 LAB — HEPARIN LEVEL (UNFRACTIONATED): HEPARIN UNFRACTIONATED: 0.11 [IU]/mL — AB (ref 0.30–0.70)

## 2015-09-02 LAB — AMMONIA: AMMONIA: 40 umol/L — AB (ref 9–35)

## 2015-09-02 LAB — LACTATE DEHYDROGENASE: LDH: 177 U/L (ref 98–192)

## 2015-09-02 LAB — PREPARE RBC (CROSSMATCH)

## 2015-09-02 LAB — PREALBUMIN: Prealbumin: 8.6 mg/dL — ABNORMAL LOW (ref 18–38)

## 2015-09-02 MED ORDER — ALBUMIN HUMAN 5 % IV SOLN
12.5000 g | Freq: Four times a day (QID) | INTRAVENOUS | Status: AC
  Administered 2015-09-02 (×3): 12.5 g via INTRAVENOUS
  Filled 2015-09-02 (×3): qty 250

## 2015-09-02 MED ORDER — NOREPINEPHRINE BITARTRATE 1 MG/ML IV SOLN
0.0000 ug/min | INTRAVENOUS | Status: DC
  Administered 2015-09-02: 40 ug/min via INTRAVENOUS
  Administered 2015-09-02: 24 ug/min via INTRAVENOUS
  Administered 2015-09-03: 13 ug/min via INTRAVENOUS
  Filled 2015-09-02 (×3): qty 16

## 2015-09-02 MED ORDER — VANCOMYCIN HCL IN DEXTROSE 750-5 MG/150ML-% IV SOLN
750.0000 mg | Freq: Two times a day (BID) | INTRAVENOUS | Status: DC
  Administered 2015-09-03: 750 mg via INTRAVENOUS
  Filled 2015-09-02 (×2): qty 150

## 2015-09-02 MED ORDER — PHENYLEPHRINE HCL 10 MG/ML IJ SOLN
0.0000 ug/min | INTRAVENOUS | Status: DC
  Administered 2015-09-02: 20 ug/min via INTRAVENOUS
  Filled 2015-09-02 (×3): qty 2

## 2015-09-02 MED ORDER — VITAL 1.5 CAL PO LIQD
1000.0000 mL | ORAL | Status: DC
  Filled 2015-09-02: qty 1000

## 2015-09-02 MED ORDER — VANCOMYCIN HCL IN DEXTROSE 1-5 GM/200ML-% IV SOLN
1000.0000 mg | Freq: Once | INTRAVENOUS | Status: AC
  Administered 2015-09-02: 1000 mg via INTRAVENOUS
  Filled 2015-09-02: qty 200

## 2015-09-02 MED ORDER — DEXTROSE 5 % IV SOLN
0.0000 ug/min | INTRAVENOUS | Status: DC
  Administered 2015-09-02: 50 ug/min via INTRAVENOUS
  Filled 2015-09-02: qty 2

## 2015-09-02 MED ORDER — VASOPRESSIN 20 UNIT/ML IV SOLN
0.0300 [IU]/min | INTRAVENOUS | Status: DC
  Administered 2015-09-02 – 2015-09-05 (×4): 0.03 [IU]/min via INTRAVENOUS
  Filled 2015-09-02 (×5): qty 2

## 2015-09-02 MED ORDER — SODIUM CHLORIDE 0.9 % IV SOLN
Freq: Once | INTRAVENOUS | Status: AC
  Administered 2015-09-02: 13:00:00 via INTRAVENOUS

## 2015-09-02 MED ORDER — ALBUMIN HUMAN 5 % IV SOLN
12.5000 g | Freq: Once | INTRAVENOUS | Status: AC
  Administered 2015-09-02: 12.5 g via INTRAVENOUS
  Filled 2015-09-02: qty 500

## 2015-09-02 MED ORDER — NOREPINEPHRINE BITARTRATE 1 MG/ML IV SOLN
0.0000 ug/min | INTRAVENOUS | Status: DC
  Administered 2015-09-02: 40 ug/min via INTRAVENOUS
  Administered 2015-09-02: 50 ug/min via INTRAVENOUS
  Administered 2015-09-02: 35 ug/min via INTRAVENOUS
  Filled 2015-09-02 (×3): qty 4

## 2015-09-02 MED ORDER — ALBUMIN HUMAN 5 % IV SOLN
12.5000 g | Freq: Once | INTRAVENOUS | Status: AC
  Administered 2015-09-02: 12.5 g via INTRAVENOUS

## 2015-09-02 MED ORDER — TRAVASOL 10 % IV SOLN: INTRAVENOUS | Status: DC

## 2015-09-02 MED ORDER — SODIUM CHLORIDE 0.9 % IV SOLN
500.0000 mg | Freq: Three times a day (TID) | INTRAVENOUS | Status: DC
  Administered 2015-09-02 – 2015-09-03 (×4): 500 mg via INTRAVENOUS
  Filled 2015-09-02 (×4): qty 500

## 2015-09-02 MED ORDER — GUAIFENESIN ER 600 MG PO TB12
600.0000 mg | ORAL_TABLET | Freq: Two times a day (BID) | ORAL | Status: DC
  Administered 2015-09-02 – 2015-09-15 (×25): 600 mg via ORAL
  Filled 2015-09-02 (×27): qty 1

## 2015-09-02 NOTE — Significant Event (Signed)
Wasted 76mcg fentanyl with RN Melford Aase.

## 2015-09-02 NOTE — Significant Event (Signed)
Patient does not want to sign consent for OR tomorrow today. Patient says he will sign it tomorrow instead. Consent placed in chart.

## 2015-09-02 NOTE — Progress Notes (Signed)
Patient ID: [name], male   DOB: 04-Nov-1970, 45 y.o.   MRN: [id number]    HeartMate 2 Rounding Note  Subjective:    Admitted from [facility] in [place] with driveline abscess. Blood cultures --> MSSA and has C diff. Remains on Nafcillin  and oral vancomycin.   08/27/15 S/P I &D Epigastric with hardware exposed and VAC placement.  5/11 repeat I&D and wound vac replacement.  5/11 TEE no vegetations on valves or ICD wire   Yesterday had poor appetite. Also started on entresto. Not taking POs. No appetite. Maps 50-60s. Received 3 albumin, 1UPRBCs, and started on neo. Also had vomiting with ? Aspiration.  Had a loose stool over night .   Denies SOB. Mild abdominal pain.     LVAD INTERROGATION:  HeartMate II LVAD:  Flow 5.5 liters/min, speed 9200, power 6.0  PI 5.8 .   Objective:    Vital Signs:   Temp:  [97.5 F (36.4 C)-97.9 F (36.6 C)] 97.9 F (36.6 C) (05/15 0400) Pulse Rate:  [70-84] 79 (05/15 0630) Resp:  [14-22] 15 (05/15 0630) BP: (61-115)/(41-90) 69/57 mmHg (05/15 0630) SpO2:  [93 %-100 %] 95 % (05/15 0630) Weight:  [188 lb 11.4 oz (85.6 kg)] 188 lb 11.4 oz (85.6 kg) (05/15 0500) Last BM Date: 09/01/15 Mean arterial Pressure  50-60s   Intake/Output:   Intake/Output Summary (Last 24 hours) at 09/02/15 0654 Last data filed at 09/02/15 0600  Gross per 24 hour  Intake   3118 ml  Output      0 ml  Net   3118 ml     Physical Exam: CVP 4-5  General:  Lying in chair. Very weak.  HEENT: Sceleral icterus Neck: supple.  Carotids 2+ bilat; no bruits. No lymphadenopathy or thryomegaly appreciated. Cor: Mechanical heart sounds with LVAD hum present. Lungs: clear  Abdomen: soft, tender, distended. No hepatosplenomegaly. No bruits or masses. Good bowel sounds. Mid epigastric dressing blood stained Driveline: C/D/I; securement device intact and driveline incorporated Extremities: no cyanosis, clubbing, rash, edema R and LLE SCDs.  Neuro: alert &  orientedx3, cranial nerves grossly intact. moves all 4 extremities w/o difficulty.  Telemetry: NSR 70s   Labs: Basic Metabolic Panel:  Recent Labs Lab 08/26/15 2155  08/29/15 0508  08/30/15 0459 08/31/15 0427 09/01/15 0442 09/01/15 2106 09/02/15 0215  NA  --   < > 139  < > 141 138 138 139 135  K  --   < > 3.3*  < > 4.0 3.5 3.3* 3.6 3.7  CL  --   < > 104  < > 106 106 105 104 105  CO2  --   < > 23  --  22 22 23   --  21*  GLUCOSE  --   < > 92  < > 66 77 73 103* 116*  BUN  --   < > 10  < > 11 10 <5* 9 10  CREATININE  --   < > 1.40*  < > 1.43* 1.33* 1.26* 1.40* 2.01*  CALCIUM  --   < > 8.5*  --  8.6* 8.1* 8.2*  --  7.8*  MG 1.9  --   --   --   --   --   --   --   --   < > = values in this interval not displayed.  Liver Function Tests:  Recent Labs Lab 08/27/15 1048 08/30/15 0459  AST 44* 63*  ALT 41 48  ALKPHOS  114 108  BILITOT 2.0* 5.9*  PROT 6.0* 6.6  ALBUMIN 1.7* 2.1*   No results for input(s): LIPASE, AMYLASE in the last 168 hours. No results for input(s): AMMONIA in the last 168 hours.  CBC:  Recent Labs Lab 08/26/15 2155  08/31/15 0427 08/31/15 1510 09/01/15 0442 09/01/15 1600 09/01/15 2106 09/02/15 0215  WBC 11.4*  < > 9.8 14.8* 12.1* 12.4*  --  19.4*  NEUTROABS 9.7*  --   --   --   --   --   --   --   HGB 8.4*  < > 8.6* 9.8* 8.3* 8.0* 10.5* 9.3*  HCT 25.7*  < > 25.5* 29.2* 24.5* 23.2* 31.0* 27.7*  MCV 62.4*  < > 65.1* 64.3* 64.6* 65.5*  --  66.6*  PLT 331  < > 276 346 267 274  --  377  < > = values in this interval not displayed.  INR:  Recent Labs Lab 08/27/15 0410 08/28/15 0450 08/29/15 0604 08/30/15 0459 09/01/15 0451  INR 2.55* 1.54* 1.37 1.57* 1.54*    Other results:    Imaging: No results found.   Medications:     Scheduled Medications: . allopurinol  300 mg Oral Daily  . amiodarone  100 mg Oral Daily  . citalopram  20 mg Oral Daily  . digoxin  0.125 mg Oral Daily  . docusate sodium  200 mg Oral Daily  . ferrous  sulfate  325 mg Oral BID WC  . hydrALAZINE  100 mg Oral Q8H  . nafcillin IV  2 g Intravenous Q4H  . pantoprazole  40 mg Oral Daily  . polyethylene glycol  17 g Oral Once  . pregabalin  75 mg Oral BID  . rifampin  300 mg Oral Q8H  . sildenafil  20 mg Oral TID  . sodium chloride flush  10-40 mL Intracatheter Q12H  . sodium chloride flush  3 mL Intravenous Q12H  . spironolactone  25 mg Oral Daily  . tamsulosin  0.4 mg Oral Daily  . vancomycin  125 mg Oral QID    Infusions: . dextrose 5 % and 0.9% NaCl 50 mL/hr at 09/02/15 0600  . heparin 1,000 Units/hr (09/02/15 0600)  . phenylephrine (NEO-SYNEPHRINE) Adult infusion 40 mcg/min (09/02/15 0600)    PRN Medications: sodium chloride, bisacodyl **OR** bisacodyl, fentaNYL (SUBLIMAZE) injection, ondansetron, oxyCODONE, sodium chloride flush, sodium chloride flush, traMADol, zolpidem   Assessment:   1. LVAD Complication- Driveline abscess 2. MSSA bacteremia -> septic shock 3. Acute/Chronic systolic HF s/p VAD placement 9/16 4. C. Difficile colitis 5. 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 Microcytic anemia 8. Urinary retention 9. Hypokalelmia 10. HTN 11. Anemia 12. Vomiting.  13. Poor PO intake.   Plan/Discussion:    S/P I&D LVAD driveline abscess. Exposed hardware. ID following. Continue po Vanc for C diff, Nafcillin, and Rifampin. All blood cultures NGTD. Underwent deeper debridement and wound vac replacement 5/11. Abd abscess with few staphylococcus aureus. No vegetations on TEE.   OR time pending. Continue dressing changes. WBC trending up 12>19. Afebrile. Vomited over night with ? Aspiration. CXR now.   Suspect we will proceed with 6 weeks of abx then stop and see if re-infection recurs. Can consider long-term suppressive abx but think if we are trying to preserve his transplant option need to make sure infection does not recur off abx.   C. Diff- Still having loose stools. Continue oral vanc.    Hemoglobin 9.3. VAC stopped  due to bleeding. Received 1UPRBCs this morning.      MAP running low over night 50-60s. Wilburn Mylar started on entresto. Keep off entresto and hydralazine. Neo started. For now keep on sildenafil.   Renal function trending up.  Creatinine 1.4>2.0, Check dig level.   VAD parameters stable.   On heparin. Will start coumadin after he is finished with the OR (returns on Monday). Cannot use Eliquis because it also uses P450 system and Rifampin induces CYP 3a/4 and would likely lead to subtherapeutic levels as well.  Need coumadin so we can follow level.   I reviewed the LVAD parameters from today, and compared the results to the patient's prior recorded data.  No programming changes were made.  The LVAD is functioning within specified parameters.  The patient performs LVAD self-test daily.  LVAD interrogation was negative for any significant power changes, alarms or PI events/speed drops.  LVAD equipment check completed and is in good working order.  Back-up equipment present.   LVAD education done on emergency procedures and precautions and reviewed exit site care.  Length of Stay: 7  Amy Clegg NP-C  09/02/2015, 6:54 AM  VAD Team --- VAD ISSUES ONLY--- Pager [phone] (7am - 7am)  Advanced Heart Failure Team  Pager [phone] (M-F; 7a - 4p)  Please contact [facility] for night-coverage after hours (4p -7a ) and weekends on [email or web address]   Patient seen and examined with Darrick Grinder, NP. We discussed all aspects of the encounter. I agree with the assessment and plan as stated above.   He is much worse today. Hypotensive. Likely septic shock +/- RV failure or hemorrhagic. Will switch neo to norepi. Expand abx to imipenem. Place arterial line. He is icteric will check bilirubin. Support RV with norepi. VAD parameters stable.   Watch ileus closely.   The patient is critically ill with multiple organ systems failure and requires high complexity decision making for  assessment and support, frequent evaluation and titration of therapies, application of advanced monitoring technologies and extensive interpretation of multiple databases.   Critical Care Time devoted to patient care services described in this note is 45 Minutes.  Jorian Willhoite,MD 10:26 AM

## 2015-09-02 NOTE — Significant Event (Signed)
Have not been able to get urine culture at all today. Staff did bladder scan X 2 today, last one bladder scanned was 100cc. Patient does not want RN to obtain urine via In/Out catheter. Spoke with Darrick Grinder, NP regarding this. Will obtain when patient able to void.

## 2015-09-02 NOTE — Progress Notes (Signed)
Pharmacy Antibiotic Note  [name] is a 45 y.o. male with HF s/p LVAD 9/16  transferred from OSH after contracting CDiff and developing MSSA abscess and drive line infection Pharmacy has been consulted for Nafcillin, Rifampin and vancomycin po dosing. WBC 13> 16 started improving 12 with ABX but now has bumped to 19, drop in BP now on phenylephrine after N/V and aspiration over weekend - remains afebrile.   Cr stable  S/P OR for debriedment 5/9 and 5/11.  New culture data sent  - confirmed MSSA.    Rifmapin can decrease serum concentration of digoxin (level 0.7 4/17 upper limit so ok) and may decrease amiodarone activity.  Patient with Afib hx currently in SR - monitor  Plan: Nafcillin 2gm IV q4 - stop while on imipenem - Discussed with ID cover aspiration with imipenem 500mg  q8 Rifampin 300mg  po q8 Vancomycin 125mg  po q6   Height: 5\' 7"  (170.2 cm) Weight: 188 lb 11.4 oz (85.6 kg) IBW/kg (Calculated) : 66.1  Temp (24hrs), Avg:97.7 F (36.5 C), Min:97.5 F (36.4 C), Max:97.9 F (36.6 C)   Recent Labs Lab 08/30/15 0459 08/31/15 0427 08/31/15 1510 09/01/15 0442 09/01/15 1600 09/01/15 2106 09/02/15 0215  WBC 16.0* 9.8 14.8* 12.1* 12.4*  --  19.4*  CREATININE 1.43* 1.33*  --  1.26*  --  1.40* 2.01*    Estimated Creatinine Clearance: 47.5 mL/min (by C-G formula based on Cr of 2.01).    Allergies  Allergen Reactions  . Phytonadione Other (See Comments)    Patient has LVAD: please check with LVAD coordinator on call or LVAD MD on call before reversal of anticoagulation with vit k    Antimicrobials this admission: 5/8 nafcillin >> 5/15 5/8 vancomycin po >>  5/9 Rifampin>> 5/15 imipenem>>  Dose adjustments this admission:   Microbiology results: 5/2 OSH MSSA 5/2 OSH Cdiff 5/9 BCx: ngtd 5/9 Wound Cx: MSSA 5/8 MRSA PCR: neg  Bonnita Nasuti Pharm.D. CPP, BCPS Clinical Pharmacist [phone] 09/02/2015 8:55 AM

## 2015-09-02 NOTE — Progress Notes (Signed)
INFECTIOUS DISEASE PROGRESS NOTE  ID: [name] is a 45 y.o. male with  Active Problems:   Left ventricular assist device (LVAD) complication   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[place])   Abscess of abdominal wall   Acute on chronic systolic (congestive) heart failure (HCC)   Enteritis due to Clostridium difficile   Staphylococcus aureus bacteremia  Subjective: Hypotension Feels poorly- c/o dizziness and muscle aches 2 Bm yesterday  Abtx:  Anti-infectives    Start     Dose/Rate Route Frequency Ordered Stop   09/02/15 0900  imipenem-cilastatin (PRIMAXIN) 500 mg in sodium chloride 0.9 % 100 mL IVPB     500 mg 200 mL/hr over 30 Minutes Intravenous Every 8 hours 09/02/15 0814     08/30/15 0400  vancomycin (VANCOCIN) 1,250 mg in sodium chloride 0.9 % 250 mL IVPB  Status:  Discontinued     1,250 mg 166.7 mL/hr over 90 Minutes Intravenous To Surgery 08/29/15 0611 08/29/15 1012   08/30/15 0400  cefUROXime (ZINACEF) 1.5 g in dextrose 5 % 50 mL IVPB  Status:  Discontinued     1.5 g 100 mL/hr over 30 Minutes Intravenous To Surgery 08/29/15 0611 08/29/15 1012   08/30/15 0400  fluconazole (DIFLUCAN) IVPB 400 mg  Status:  Discontinued     400 mg 100 mL/hr over 120 Minutes Intravenous To Surgery 08/29/15 0611 08/29/15 1012   08/30/15 0400  rifampin (RIFADIN) 600 mg in sodium chloride 0.9 % 100 mL IVPB  Status:  Discontinued     600 mg 200 mL/hr over 30 Minutes Intravenous To Surgery 08/29/15 0611 08/29/15 1012   08/29/15 0845  vancomycin (VANCOCIN) 1,000 mg in sodium chloride 0.9 % 1,000 mL irrigation  Status:  Discontinued      Irrigation To Surgery 08/29/15 0834 08/29/15 1012   08/29/15 0615  cefUROXime (ZINACEF) 750 mg in dextrose 5 % 50 mL IVPB  Status:  Discontinued     750 mg 100 mL/hr over 30 Minutes Intravenous To Surgery 08/29/15 0609 08/29/15 1012   08/29/15 0615  vancomycin (VANCOCIN) powder 1,000 mg     1,000 mg Other To Surgery 08/29/15 0609 08/29/15 0900   08/27/15 2300  vancomycin (VANCOCIN) 50 mg/mL oral solution 125 mg  Status:  Discontinued     125 mg Oral Every 6 hours 08/26/15 2200 08/27/15 0807   08/27/15 1601  vancomycin (VANCOCIN) 1,000 mg in sodium chloride 0.9 % 1,000 mL irrigation  Status:  Discontinued       As needed 08/27/15 1601 08/27/15 1740   08/27/15 1500  vancomycin (VANCOCIN) 1,000 mg in sodium chloride 0.9 % 1,000 mL irrigation  Status:  Discontinued      Irrigation To Surgery 08/27/15 1449 08/27/15 1748   08/27/15 1400  rifampin (RIFADIN) capsule 300 mg     300 mg Oral Every 8 hours 08/27/15 1141     08/27/15 1100  vancomycin (VANCOCIN) 50 mg/mL oral solution 125 mg     125 mg Oral 4 times daily 08/27/15 1052 09/10/15 0959   08/26/15 2145  nafcillin 2 g in dextrose 5 % 100 mL IVPB  Status:  Discontinued     2 g 200 mL/hr over 30 Minutes Intravenous Every 4 hours 08/26/15 2104 09/02/15 0850      Medications:  Scheduled: . albumin human  12.5 g Intravenous Q6H  . allopurinol  300 mg Oral Daily  . amiodarone  100 mg Oral Daily  . citalopram  20 mg Oral Daily  .  digoxin  0.125 mg Oral Daily  . docusate sodium  200 mg Oral Daily  . ferrous sulfate  325 mg Oral BID WC  . guaiFENesin  600 mg Oral BID  . imipenem-cilastatin  500 mg Intravenous Q8H  . pantoprazole  40 mg Oral Daily  . polyethylene glycol  17 g Oral Once  . pregabalin  75 mg Oral BID  . rifampin  300 mg Oral Q8H  . sildenafil  20 mg Oral TID  . sodium chloride flush  10-40 mL Intracatheter Q12H  . sodium chloride flush  3 mL Intravenous Q12H  . spironolactone  25 mg Oral Daily  . tamsulosin  0.4 mg Oral Daily  . vancomycin  125 mg Oral QID    Objective: Vital signs in last 24 hours: Temp:  [97.5 F (36.4 C)-97.9 F (36.6 C)] 97.9 F (36.6 C) (05/15 0400) Pulse Rate:  [70-84] 80 (05/15 0815) Resp:  [14-22] 18 (05/15 0830) BP: (61-115)/(41-90) 62/42 mmHg (05/15 0815) SpO2:  [93 %-100 %] 100 % (05/15 0815) Weight:  [85.6 kg (188 lb 11.4 oz)]  85.6 kg (188 lb 11.4 oz) (05/15 0500)   General appearance: alert, cooperative and mild distress Resp: rhonchi anterior - bilateral Cardio: regular rate and rhythm and hum GI: normal findings: soft, non-tender and abnormal findings:  distended  Lab Results  Recent Labs  09/01/15 0442 09/01/15 1600 09/01/15 2106 09/02/15 0215  WBC 12.1* 12.4*  --  19.4*  HGB 8.3* 8.0* 10.5* 9.3*  HCT 24.5* 23.2* 31.0* 27.7*  NA 138  --  139 135  K 3.3*  --  3.6 3.7  CL 105  --  104 105  CO2 23  --   --  21*  BUN <5*  --  9 10  CREATININE 1.26*  --  1.40* 2.01*   Liver Panel No results for input(s): PROT, ALBUMIN, AST, ALT, ALKPHOS, BILITOT, BILIDIR, IBILI in the last 72 hours. Sedimentation Rate No results for input(s): ESRSEDRATE in the last 72 hours. C-Reactive Protein No results for input(s): CRP in the last 72 hours.  Microbiology: Recent Results (from the past 240 hour(s))  MRSA PCR Screening     Status: None   Collection Time: 08/26/15 10:00 PM  Result Value Ref Range Status   MRSA by PCR NEGATIVE NEGATIVE Final    Comment:        The GeneXpert MRSA Assay (FDA approved for NASAL specimens only), is one component of a comprehensive MRSA colonization surveillance program. It is not intended to diagnose MRSA infection nor to guide or monitor treatment for MRSA infections.   Stat Gram stain     Status: None   Collection Time: 08/27/15  7:30 AM  Result Value Ref Range Status   Specimen Description ABSCESS ABDOMEN  Final   Special Requests NONE  Final   Gram Stain   Final    MODERATE WBC PRESENT,BOTH PMN AND MONONUCLEAR NO ORGANISMS SEEN    Report Status 08/27/2015 FINAL  Final  Culture, routine-abscess     Status: None   Collection Time: 08/27/15  7:30 AM  Result Value Ref Range Status   Specimen Description ABSCESS ABDOMEN  Final   Special Requests NONE  Final   Gram Stain   Final    MODERATE WBC PRESENT,BOTH PMN AND MONONUCLEAR NO SQUAMOUS EPITHELIAL CELLS SEEN NO  ORGANISMS SEEN Performed at [facility] Performed at [facility]    Culture   Final    FEW STAPHYLOCOCCUS AUREUS Note:  RIFAMPIN AND GENTAMICIN SHOULD NOT BE USED AS SINGLE DRUGS FOR TREATMENT OF STAPH INFECTIONS. This organism DOES NOT demonstrate inducible Clindamycin resistance in vitro. Performed at [facility]    Report Status 08/30/2015 FINAL  Final   Organism ID, Bacteria STAPHYLOCOCCUS AUREUS  Final      Susceptibility   Staphylococcus aureus - MIC*    CLINDAMYCIN <=0.25 SENSITIVE Sensitive     ERYTHROMYCIN >=8 RESISTANT Resistant     GENTAMICIN <=0.5 SENSITIVE Sensitive     LEVOFLOXACIN 0.25 SENSITIVE Sensitive     OXACILLIN 0.5 SENSITIVE Sensitive     RIFAMPIN <=0.5 SENSITIVE Sensitive     TRIMETH/SULFA <=10 SENSITIVE Sensitive     VANCOMYCIN 1 SENSITIVE Sensitive     TETRACYCLINE <=1 SENSITIVE Sensitive     MOXIFLOXACIN <=0.25 SENSITIVE Sensitive     * FEW STAPHYLOCOCCUS AUREUS  Culture, blood (Routine X 2) w Reflex to ID Panel     Status: None   Collection Time: 08/27/15 10:38 AM  Result Value Ref Range Status   Specimen Description BLOOD RIGHT HAND  Final   Special Requests IN PEDIATRIC BOTTLE 3CC  Final   Culture NO GROWTH 5 DAYS  Final   Report Status 09/01/2015 FINAL  Final  Culture, blood (Routine X 2) w Reflex to ID Panel     Status: None   Collection Time: 08/27/15 10:42 AM  Result Value Ref Range Status   Specimen Description BLOOD LEFT HAND  Final   Special Requests IN PEDIATRIC BOTTLE 3CC  Final   Culture NO GROWTH 5 DAYS  Final   Report Status 09/01/2015 FINAL  Final  Anaerobic culture     Status: None   Collection Time: 08/27/15  5:48 PM  Result Value Ref Range Status   Specimen Description WOUND CHEST  Final   Special Requests NONE  Final   Gram Stain   Final    NO WBC SEEN NO SQUAMOUS EPITHELIAL CELLS SEEN NO ORGANISMS SEEN Performed at [facility]    Culture   Final    NO ANAEROBES ISOLATED Performed at  [facility]    Report Status 09/01/2015 FINAL  Final  Wound culture     Status: None   Collection Time: 08/27/15  5:48 PM  Result Value Ref Range Status   Specimen Description WOUND CHEST  Final   Special Requests NONE  Final   Gram Stain   Final    NO WBC SEEN NO SQUAMOUS EPITHELIAL CELLS SEEN NO ORGANISMS SEEN Performed at [facility]    Culture   Final    NO GROWTH 2 DAYS Performed at [facility]    Report Status 08/30/2015 FINAL  Final    Studies/Results: Dg Chest Port 1 View  09/02/2015  CLINICAL DATA:  Acute on chronic CHF, aspiration, left ventricular assist device in place. EXAM: PORTABLE CHEST 1 VIEW COMPARISON:  Portable chest x-ray of Aug 30, 2015 FINDINGS: The lungs are reasonably well inflated. There is subtle retrocardiac density on the left that is more conspicuous today. There is no significant pleural effusion and there is no pneumothorax. The cardiac silhouette remains enlarged. The central pulmonary vascularity is mildly prominent. The left ventricular assist device appears to be in reasonable position and stable. The permanent pacemaker defibrillator is in stable position. The left atrial appendage clip is unchanged. The sternal wires are intact. IMPRESSION: Slight interval increase in retrocardiac density on the left consistent with worsening atelectasis or pneumonia. There is no significant pleural  effusion. Mild cardiomegaly without pulmonary interstitial or alveolar edema. The support devices appear to be in stable position. Electronically Signed   By: David  Martinique M.D.   On: 09/02/2015 07:33   Dg Abd Portable 1v  09/02/2015  CLINICAL DATA:  Ileus. EXAM: PORTABLE ABDOMEN - 1 VIEW COMPARISON:  08/28/2015 FINDINGS: Left ventricular assist device is partially visualized. There is increased gas in small and large bowel loops throughout the abdomen, however no bowel dilatation is identified. No gross intraperitoneal free air is identified on  this limited supine examination. No acute osseous abnormality is seen. IMPRESSION: Increased bowel gas without evidence of obstruction. Electronically Signed   By: Logan Bores M.D.   On: 09/02/2015 08:28     Assessment/Plan: Abscess, MSSA Drive lin infection AICD MSSA bacteremia (I spoke with [facility] lab and verified this) TEE 5-11 negative for vegetations C diff Aspiration pneumonia, L sided retrocardiac infiltrate on CXR  Total days of antibiotics: 7 nafcillin --> imipenem, po vanco, rifampin  Will watch for improvement on new atbx His repeat BCx 5-9 are negative.  Hard to assess his abd exam with LVAD sounds. His abd film does not demonstrate ileus as cause of n/v.          Bobby Rumpf Infectious Diseases (pager) [phone] [email or web address] 09/02/2015, 9:32 AM  LOS: 7 days

## 2015-09-02 NOTE — Progress Notes (Signed)
CT surgery p.m. Rounds  Patient feeling somewhat better today Had generalized malaise myalgia and weakness Probable sepsis now improving with increased antibiotic coverage If patient's norepinephrine requirement is low by tomorrow a.m. Will possibly return to OR for irrigation-washout of open abdominal wound Leaving heparin off until after possible surgery tomorrow

## 2015-09-02 NOTE — Progress Notes (Signed)
4 Days Post-Op Procedure(s) (LRB): DEBRIDEMENT OF chest wound (N/A) WOUND VAC CHANGE (N/A) Subjective: Patient with evidence of sepsis now on pressor worse Persistent drop in hemoglobin-heparin stopped I performed tracheal suctioning on patient and submitted mucopurulent material for culture-Primaxin has been started for possible aspiration  Plan wound VAC change and wound irrigation tomorrow in OR  Objective: Vital signs in last 24 hours: Temp:  [97.6 F (36.4 C)-98.1 F (36.7 C)] 97.8 F (36.6 C) (05/15 1400) Pulse Rate:  [70-84] 73 (05/15 1415) Cardiac Rhythm:  [-] Normal sinus rhythm (05/15 0700) Resp:  [13-25] 15 (05/15 1415) BP: (56-122)/(30-87) 104/73 mmHg (05/15 1400) SpO2:  [93 %-100 %] 97 % (05/15 1415) Weight:  [188 lb 11.4 oz (85.6 kg)] 188 lb 11.4 oz (85.6 kg) (05/15 0500)  Hemodynamic parameters for last 24 hours: CVP:  [2 mmHg-14 mmHg] 14 mmHg  Intake/Output from previous day: 05/14 0701 - 05/15 0700 In: 3186 [P.O.:660; I.V.:897; Blood:279; IV Piggyback:1350] Out: -  Intake/Output this shift: Total I/O In: 1357.8 [P.O.:60; I.V.:672.8; Blood:325; IV [id number] Out: -   Patient somewhat lethargic Scattered rhonchi on left Abdomen mildly distended but nontender Extremities mild edema  Lab Results:  Recent Labs  09/02/15 0215 09/02/15 0955  WBC 19.4* 23.3*  HGB 9.3* 8.0*  HCT 27.7* 23.5*  PLT 377 357   BMET:  Recent Labs  09/01/15 0442 09/01/15 2106 09/02/15 0215  NA 138 139 135  K 3.3* 3.6 3.7  CL 105 104 105  CO2 23  --  21*  GLUCOSE 73 103* 116*  BUN <5* 9 10  CREATININE 1.26* 1.40* 2.01*  CALCIUM 8.2*  --  7.8*    PT/INR:  Recent Labs  09/01/15 0451  LABPROT 18.6*  INR 1.54*   ABG    Component Value Date/Time   PHART 7.386 08/29/2015 0759   HCO3 25.2* 08/29/2015 0759   TCO2 22 09/01/2015 2106   ACIDBASEDEF 0.3 12/23/2014 0335   O2SAT 100.0 08/29/2015 0759   CBG (last 3)  No results for input(s): GLUCAP in the last  72 hours.  Assessment/Plan: S/P Procedure(s) (LRB): DEBRIDEMENT OF chest wound (N/A) WOUND VAC CHANGE (N/A) Mobilize Continue IV antibiotics Irrigate wound tomorrow in OR Treat for systemic sepsis  LOS: 7 days    Tharon Aquas Trigt III 09/02/2015

## 2015-09-02 NOTE — Progress Notes (Signed)
[facility]  Patient Status: New pt for [facility] this admission- previous pt on Milrinone and post LVAD  [facility] is providing the following services: HHRN and Home infusion services. [facility] will follow Mr. Degrande to support any home care needs upon DC to home when ordered.  If patient discharges after hours, please call [phone].   Larry Sierras 09/02/2015, 3:01 PM

## 2015-09-02 NOTE — Progress Notes (Signed)
MEDICATION RELATED CONSULT NOTE - INITIAL   Pharmacy Consult for TPN   Assessment: 45yo male with TPN per Pharmacy ordered.  This will start on 09/03/15, due to lateness in the day.  Plan:  TPN labs in AM and qMon/Thurs  Gracy Bruins, PharmD Clinical Pharmacist [facility]

## 2015-09-02 NOTE — Progress Notes (Signed)
ANTICOAGULATION CONSULT NOTE - Follow Up Consult  Pharmacy Consult for Heparin Indication: LVAD   Allergies  Allergen Reactions  . Phytonadione Other (See Comments)    Patient has LVAD: please check with LVAD coordinator on call or LVAD MD on call before reversal of anticoagulation with vit k    Patient Measurements: Height: 5\' 7"  (170.2 cm) Weight: 188 lb 11.4 oz (85.6 kg) IBW/kg (Calculated) : 66.1  Vital Signs: Temp: 97.9 F (36.6 C) (05/15 0400) Temp Source: Oral (05/15 0400) BP: 90/64 mmHg (05/15 1032) Pulse Rate: 76 (05/15 1032)  Labs:  Recent Labs  08/31/15 1510 09/01/15 0442 09/01/15 0451 09/01/15 1600 09/01/15 2106 09/02/15 0215  HGB 9.8* 8.3*  --  8.0* 10.5* 9.3*  HCT 29.2* 24.5*  --  23.2* 31.0* 27.7*  PLT 346 267  --  274  --  377  LABPROT  --   --  18.6*  --   --   --   INR  --   --  1.54*  --   --   --   HEPARINUNFRC 0.16*  --  0.14*  --   --  0.11*  CREATININE  --  1.26*  --   --  1.40* 2.01*    Estimated Creatinine Clearance: 47.5 mL/min (by C-G formula based on Cr of 2.01).  Assessment: Heparin for LVAD while warfarin on hold, now POD #4 from debridement of infected pump pocket.  Heparin drip  1000 units/hr HL 0.11 but with open chest wound and bloody drainage will not increase drip further. Monitor H/H closely, PLTC 300s   Goal of Therapy:  Heparin level 0.3-0.5 units/mL Monitor platelets by anticoagulation protocol: Yes   Plan:  -Continue heparin at 1000 units/hr.  Do not titrate for now. -Daily CBC/heparin level.  Bonnita Nasuti Pharm.D. CPP, BCPS Clinical Pharmacist [phone] 09/02/2015 10:47 AM

## 2015-09-02 NOTE — Progress Notes (Signed)
   09/02/15 1500  Clinical Encounter Type  Visited With Patient  Visit Type Initial  Referral From Nurse  Spiritual Encounters  Spiritual Needs Prayer  Stress Factors  Patient Stress Factors Health changes  Chaplain visited patient at suggestion of nurse because he is in declining health. Patient requested prayer but did indicate that he has family and a pastor who visit him.  Kennon Encinas, Chaplain

## 2015-09-02 NOTE — Progress Notes (Signed)
Called Dr Darcey Nora regarding pt's sustained low MAPs (50-60s). LVAD numbers stable with PI ranging 5.5-6.8, CVP 7-9. Also reported creatine rise from 1.2 to 2 this morning with 1 unmeasureable urine output mixed with stool of approximately 300-400cc at 10pm. Orders received for 2 Albumin and Neo gtt to be started.  Will implement and continue to monitor closely.

## 2015-09-02 NOTE — Procedures (Signed)
Arterial Catheter Insertion Procedure Note [name] [id number] [birth year]  Procedure: Insertion of Arterial Catheter  Indications: Blood pressure monitoring and Frequent blood sampling  Procedure Details Consent: Risks of procedure as well as the alternatives and risks of each were explained to the (patient/caregiver).  Consent for procedure obtained. Time Out: Verified patient identification, verified procedure, site/side was marked, verified correct patient position, special equipment/implants available, medications/allergies/relevent history reviewed, required imaging and test results available.  Performed  Maximum sterile technique was used including cap, gloves, gown, hand hygiene, mask and sheet. Skin prep: Chlorhexidine; local anesthetic administered 20 gauge catheter was inserted into left radial artery using the Seldinger technique.  Evaluation Blood flow good; BP tracing good. Complications: No apparent complications   Assisted by Myrtie Neither, RRT, RCP.   Claretta Fraise 09/02/2015

## 2015-09-03 ENCOUNTER — Inpatient Hospital Stay (HOSPITAL_COMMUNITY): Payer: 59

## 2015-09-03 ENCOUNTER — Encounter (HOSPITAL_COMMUNITY): Payer: Self-pay | Admitting: *Deleted

## 2015-09-03 ENCOUNTER — Encounter (HOSPITAL_COMMUNITY)
Admission: AD | Disposition: A | Discharge status: Rehabilitation Facility | Payer: Self-pay | Source: Ambulatory Visit | Attending: Internal Medicine

## 2015-09-03 DIAGNOSIS — N179 Acute kidney failure, unspecified: Secondary | ICD-10-CM

## 2015-09-03 DIAGNOSIS — Y838 Other surgical procedures as the cause of abnormal reaction of the patient, or of later complication, without mention of misadventure at the time of the procedure: Secondary | ICD-10-CM

## 2015-09-03 DIAGNOSIS — J690 Pneumonitis due to inhalation of food and vomit: Secondary | ICD-10-CM

## 2015-09-03 DIAGNOSIS — B9561 Methicillin susceptible Staphylococcus aureus infection as the cause of diseases classified elsewhere: Secondary | ICD-10-CM

## 2015-09-03 DIAGNOSIS — T827XXA Infection and inflammatory reaction due to other cardiac and vascular devices, implants and grafts, initial encounter: Principal | ICD-10-CM

## 2015-09-03 LAB — DIFFERENTIAL
BASOS PCT: 0 %
Basophils Absolute: 0 10*3/uL (ref 0.0–0.1)
EOS ABS: 0 10*3/uL (ref 0.0–0.7)
EOS PCT: 0 %
LYMPHS PCT: 4 %
Lymphs Abs: 0.8 10*3/uL (ref 0.7–4.0)
MONO ABS: 1.4 10*3/uL — AB (ref 0.1–1.0)
Monocytes Relative: 7 %
NEUTROS ABS: 17.8 10*3/uL — AB (ref 1.7–7.7)
NEUTROS PCT: 89 %

## 2015-09-03 LAB — CBC
HCT: 23.8 % — ABNORMAL LOW (ref 39.0–52.0)
HCT: 25.9 % — ABNORMAL LOW (ref 39.0–52.0)
HEMATOCRIT: 24.5 % — AB (ref 39.0–52.0)
HEMOGLOBIN: 8.6 g/dL — AB (ref 13.0–17.0)
Hemoglobin: 8.5 g/dL — ABNORMAL LOW (ref 13.0–17.0)
Hemoglobin: 8.8 g/dL — ABNORMAL LOW (ref 13.0–17.0)
MCH: 24.6 pg — ABNORMAL LOW (ref 26.0–34.0)
MCH: 24 pg — ABNORMAL LOW (ref 26.0–34.0)
MCH: 24.3 pg — ABNORMAL LOW (ref 26.0–34.0)
MCHC: 35.7 g/dL (ref 30.0–36.0)
MCHC: 34 g/dL (ref 30.0–36.0)
MCHC: 35.1 g/dL (ref 30.0–36.0)
MCV: 69 fL — ABNORMAL LOW (ref 78.0–100.0)
MCV: 70.8 fL — ABNORMAL LOW (ref 78.0–100.0)
MCV: 69.2 fL — AB (ref 78.0–100.0)
Platelets: 286 10*3/uL (ref 150–400)
Platelets: 266 10*3/uL (ref 150–400)
Platelets: 213 10*3/uL (ref 150–400)
RBC: 3.45 MIL/uL — ABNORMAL LOW (ref 4.22–5.81)
RBC: 3.66 MIL/uL — ABNORMAL LOW (ref 4.22–5.81)
RBC: 3.54 MIL/uL — AB (ref 4.22–5.81)
RDW: 26.2 % — ABNORMAL HIGH (ref 11.5–15.5)
RDW: 26 % — ABNORMAL HIGH (ref 11.5–15.5)
RDW: 25.7 % — AB (ref 11.5–15.5)
WBC: 20 10*3/uL — ABNORMAL HIGH (ref 4.0–10.5)
WBC: 18.3 10*3/uL — ABNORMAL HIGH (ref 4.0–10.5)
WBC: 19.9 10*3/uL — AB (ref 4.0–10.5)

## 2015-09-03 LAB — COMPREHENSIVE METABOLIC PANEL
ALT: 27 U/L (ref 17–63)
ANION GAP: 14 (ref 5–15)
AST: 39 U/L (ref 15–41)
Albumin: 2.1 g/dL — ABNORMAL LOW (ref 3.5–5.0)
Alkaline Phosphatase: 96 U/L (ref 38–126)
BILIRUBIN TOTAL: 11.4 mg/dL — AB (ref 0.3–1.2)
BUN: 17 mg/dL (ref 6–20)
CALCIUM: 7.5 mg/dL — AB (ref 8.9–10.3)
CO2: 18 mmol/L — ABNORMAL LOW (ref 22–32)
Chloride: 102 mmol/L (ref 101–111)
Creatinine, Ser: 3.89 mg/dL — ABNORMAL HIGH (ref 0.61–1.24)
GFR, EST AFRICAN AMERICAN: 20 mL/min — AB (ref >60)
GFR, EST NON AFRICAN AMERICAN: 17 mL/min — AB (ref >60)
Glucose, Bld: 107 mg/dL — ABNORMAL HIGH (ref 65–99)
POTASSIUM: 4 mmol/L (ref 3.5–5.1)
Sodium: 134 mmol/L — ABNORMAL LOW (ref 135–145)
TOTAL PROTEIN: 6 g/dL — AB (ref 6.5–8.1)

## 2015-09-03 LAB — CARBOXYHEMOGLOBIN
Carboxyhemoglobin: 1.7 % — ABNORMAL HIGH (ref 0.5–1.5)
METHEMOGLOBIN: 1.3 % (ref 0.0–1.5)
O2 Saturation: 66.1 %
Total hemoglobin: 8.2 g/dL — ABNORMAL LOW (ref 13.5–18.0)

## 2015-09-03 LAB — BASIC METABOLIC PANEL
Anion gap: 12 (ref 5–15)
BUN: 20 mg/dL (ref 6–20)
CO2: 18 mmol/L — ABNORMAL LOW (ref 22–32)
Calcium: 7.3 mg/dL — ABNORMAL LOW (ref 8.9–10.3)
Chloride: 102 mmol/L (ref 101–111)
Creatinine, Ser: 4.68 mg/dL — ABNORMAL HIGH (ref 0.61–1.24)
GFR calc Af Amer: 16 mL/min — ABNORMAL LOW (ref >60)
GFR calc non Af Amer: 14 mL/min — ABNORMAL LOW (ref >60)
Glucose, Bld: 111 mg/dL — ABNORMAL HIGH (ref 65–99)
Potassium: 3.9 mmol/L (ref 3.5–5.1)
Sodium: 132 mmol/L — ABNORMAL LOW (ref 135–145)

## 2015-09-03 LAB — URINALYSIS, ROUTINE W REFLEX MICROSCOPIC
Bilirubin Urine: NEGATIVE
Glucose, UA: NEGATIVE mg/dL
Ketones, ur: NEGATIVE mg/dL
Nitrite: NEGATIVE
PROTEIN: 30 mg/dL — AB
Specific Gravity, Urine: 1.009 (ref 1.005–1.030)
pH: 6.5 (ref 5.0–8.0)

## 2015-09-03 LAB — URINE MICROSCOPIC-ADD ON

## 2015-09-03 LAB — PREPARE RBC (CROSSMATCH)

## 2015-09-03 LAB — MAGNESIUM: Magnesium: 1.8 mg/dL (ref 1.7–2.4)

## 2015-09-03 LAB — PREALBUMIN: Prealbumin: 8.8 mg/dL — ABNORMAL LOW (ref 18–38)

## 2015-09-03 LAB — PHOSPHORUS: PHOSPHORUS: 6.7 mg/dL — AB (ref 2.5–4.6)

## 2015-09-03 LAB — GLUCOSE, CAPILLARY: Glucose-Capillary: 104 mg/dL — ABNORMAL HIGH (ref 65–99)

## 2015-09-03 LAB — LACTATE DEHYDROGENASE: LDH: 212 U/L — AB (ref 98–192)

## 2015-09-03 LAB — TRIGLYCERIDES: Triglycerides: 136 mg/dL (ref <150)

## 2015-09-03 LAB — CK: CK TOTAL: 46 U/L — AB (ref 49–397)

## 2015-09-03 SURGERY — IRRIGATION AND DEBRIDEMENT EXTREMITY
Anesthesia: Monitor Anesthesia Care

## 2015-09-03 SURGERY — IRRIGATION AND DEBRIDEMENT EXTREMITY
Anesthesia: General

## 2015-09-03 MED ORDER — SODIUM BICARBONATE 8.4 % IV SOLN
50.0000 meq | Freq: Once | INTRAVENOUS | Status: AC
  Administered 2015-09-03: 50 meq via INTRAVENOUS

## 2015-09-03 MED ORDER — VANCOMYCIN HCL IN DEXTROSE 1-5 GM/200ML-% IV SOLN: 1000.0000 mg | INTRAVENOUS | Status: DC

## 2015-09-03 MED ORDER — MIDAZOLAM HCL 2 MG/2ML IJ SOLN
INTRAMUSCULAR | Status: AC
  Administered 2015-09-03: 2 mg
  Filled 2015-09-03: qty 4

## 2015-09-03 MED ORDER — SODIUM BICARBONATE 8.4 % IV SOLN
INTRAVENOUS | Status: AC
  Administered 2015-09-03: 50 meq via INTRAVENOUS
  Filled 2015-09-03: qty 50

## 2015-09-03 MED ORDER — SODIUM CHLORIDE 0.9 % IV SOLN
500.0000 mg | Freq: Two times a day (BID) | INTRAVENOUS | Status: DC
  Administered 2015-09-04 – 2015-09-05 (×3): 500 mg via INTRAVENOUS
  Filled 2015-09-03 (×4): qty 500

## 2015-09-03 MED ORDER — MIDAZOLAM HCL 2 MG/2ML IJ SOLN
3.0000 mg | Freq: Once | INTRAMUSCULAR | Status: AC
  Administered 2015-09-03: 3 mg via INTRAVENOUS

## 2015-09-03 MED ORDER — ALBUMIN HUMAN 5 % IV SOLN
INTRAVENOUS | Status: AC
  Administered 2015-09-03: 12.5 g via INTRAVENOUS
  Filled 2015-09-03: qty 250

## 2015-09-03 MED ORDER — PRO-STAT SUGAR FREE PO LIQD
30.0000 mL | Freq: Three times a day (TID) | ORAL | Status: DC
  Administered 2015-09-03 – 2015-09-21 (×46): 30 mL
  Filled 2015-09-03 (×45): qty 30

## 2015-09-03 MED ORDER — SODIUM CHLORIDE 0.9 % IV SOLN
Freq: Once | INTRAVENOUS | Status: AC
  Administered 2015-09-03: 11:00:00 via INTRAVENOUS

## 2015-09-03 MED ORDER — NEPRO/CARBSTEADY PO LIQD
237.0000 mL | ORAL | Status: DC
  Filled 2015-09-03 (×5): qty 237

## 2015-09-03 MED ORDER — FENTANYL CITRATE (PF) 100 MCG/2ML IJ SOLN
75.0000 ug | Freq: Once | INTRAMUSCULAR | Status: AC
  Administered 2015-09-03: 75 ug via INTRAVENOUS

## 2015-09-03 MED ORDER — FUROSEMIDE 10 MG/ML IJ SOLN
INTRAMUSCULAR | Status: AC
  Administered 2015-09-03: 80 mg via INTRAVENOUS
  Filled 2015-09-03: qty 8

## 2015-09-03 MED ORDER — VANCOMYCIN HCL 1000 MG IV SOLR
INTRAVENOUS | Status: DC
  Filled 2015-09-03 (×2): qty 1000

## 2015-09-03 MED ORDER — FUROSEMIDE 10 MG/ML IJ SOLN
80.0000 mg | Freq: Once | INTRAMUSCULAR | Status: AC
  Administered 2015-09-03: 80 mg via INTRAVENOUS

## 2015-09-03 MED ORDER — SODIUM CHLORIDE 0.9 % IV SOLN
250.0000 mg | Freq: Four times a day (QID) | INTRAVENOUS | Status: DC
  Administered 2015-09-03 (×2): 250 mg via INTRAVENOUS
  Filled 2015-09-03 (×4): qty 250

## 2015-09-03 MED ORDER — ALBUMIN HUMAN 5 % IV SOLN
12.5000 g | Freq: Once | INTRAVENOUS | Status: AC
  Administered 2015-09-03: 12.5 g via INTRAVENOUS

## 2015-09-03 MED ORDER — NEPRO/CARBSTEADY PO LIQD
1000.0000 mL | ORAL | Status: DC
  Administered 2015-09-03 – 2015-09-06 (×5): 1000 mL
  Filled 2015-09-03 (×5): qty 1000

## 2015-09-03 MED ORDER — VANCOMYCIN HCL 1000 MG IV SOLR
INTRAVENOUS | Status: DC | PRN
  Administered 2015-09-03: 1000 mL

## 2015-09-03 MED FILL — Vancomycin HCl For IV Soln 10 GM (Base Equivalent): INTRAVENOUS | Qty: 1250 | Status: AC

## 2015-09-03 MED FILL — Vasopressin Inj 20 Unit/ML: INTRAMUSCULAR | Qty: 3 | Status: AC

## 2015-09-03 MED FILL — Aminocaproic Acid Inj 250 MG/ML: INTRAVENOUS | Qty: 40 | Status: AC

## 2015-09-03 MED FILL — Sodium Chloride IV Soln 0.9%: INTRAVENOUS | Qty: 100 | Status: AC

## 2015-09-03 MED FILL — Rifampin For Inj 600 MG: INTRAVENOUS | Qty: 600 | Status: AC

## 2015-09-03 MED FILL — Sodium Chloride IV Soln 0.9%: INTRAVENOUS | Qty: 250 | Status: AC

## 2015-09-03 MED FILL — Cefuroxime Sodium For Inj 750 MG: INTRAMUSCULAR | Qty: 750 | Status: AC

## 2015-09-03 MED FILL — Cefuroxime Sodium For Inj 1.5 GM: INTRAMUSCULAR | Qty: 1.5 | Status: AC

## 2015-09-03 SURGICAL SUPPLY — 27 items
BAG DECANTER FOR FLEXI CONT (MISCELLANEOUS) ×3 IMPLANT
BLADE SURG 10 STRL SS (BLADE) ×3 IMPLANT
BNDG GAUZE ELAST 4 BULKY (GAUZE/BANDAGES/DRESSINGS) ×3 IMPLANT
CANISTER SUCTION 2500CC (MISCELLANEOUS) ×3 IMPLANT
CAUTERY SURG HI TEMP FINE TIP (MISCELLANEOUS) ×3 IMPLANT
COVER BACK TABLE 60X90IN (DRAPES) ×3 IMPLANT
DRAPE LAPAROSCOPIC ABDOMINAL (DRAPES) ×3 IMPLANT
DRSG AQUACEL AG ADV 3.5X14 (GAUZE/BANDAGES/DRESSINGS) ×3 IMPLANT
DRSG MEPITEL 4X7.2 (GAUZE/BANDAGES/DRESSINGS) ×3 IMPLANT
DRSG PAD ABDOMINAL 8X10 ST (GAUZE/BANDAGES/DRESSINGS) ×3 IMPLANT
GAUZE SPONGE 4X4 12PLY STRL (GAUZE/BANDAGES/DRESSINGS) ×3 IMPLANT
GLOVE BIO SURGEON STRL SZ7.5 (GLOVE) ×6 IMPLANT
GOWN STRL REUS W/ TWL LRG LVL3 (GOWN DISPOSABLE) ×3 IMPLANT
GOWN STRL REUS W/TWL LRG LVL3 (GOWN DISPOSABLE) ×6
KIT BASIN OR (CUSTOM PROCEDURE TRAY) ×3 IMPLANT
MICROMATRIX 500MG (Tissue) ×3 IMPLANT
SOLUTION PARTIC MCRMTRX 500MG (Tissue) ×1 IMPLANT
SPONGE LAP 18X18 X RAY DECT (DISPOSABLE) ×6 IMPLANT
SUT VIC AB 3-0 SH 27 (SUTURE) ×2
SUT VIC AB 3-0 SH 27X BRD (SUTURE) ×1 IMPLANT
SYR BULB IRRIGATION 50ML (SYRINGE) ×3 IMPLANT
TOWEL OR 17X26 10 PK STRL BLUE (TOWEL DISPOSABLE) ×3 IMPLANT
TUBE CONNECTING 12'X1/4 (SUCTIONS) ×1
TUBE CONNECTING 12X1/4 (SUCTIONS) ×2 IMPLANT
TUBE CONNECTING 20'X1/4 (TUBING) ×1
TUBE CONNECTING 20X1/4 (TUBING) ×2 IMPLANT
YANKAUER SUCT BULB TIP NO VENT (SUCTIONS) ×3 IMPLANT

## 2015-09-03 NOTE — Progress Notes (Signed)
Pharmacy Antibiotic Note  [name] is a 45 y.o. male with HF s/p LVAD 9/16  transferred from OSH after contracting CDiff and developing MSSA abscess and drive line infection Pharmacy has been consulted for Nafcillin, Rifampin and vancomycin po dosing.  5/14 drop in BP started  on phenylephrine changed to NE and vasopressin 5/15  WBC 13> 16 started improving 12 with ABX but now has bumped to 23 5/15 > 20 5/16 with change in ABX S/P  N/V and aspiration over weekend - Cxr LLL possible pna Broaden ABX 5/15 Now with ARF Cr 1.4> 3.9 Will adjust ABX S/P OR for debriedment 5/9 and 5/11 repeat at bedside 5/16  New culture data sent  - confirmed MSSA.    Rifmapin can decrease serum concentration of digoxin (level 0.7 4/17 upper limit so ok) and may decrease amiodarone activity.  Patient with Afib hx currently in SR - monitor  Plan: Nafcillin 2gm IV q4 - stop while on imipenem - Discussed with ID cover aspiration with imipenem dec 250mg  q6 Rifampin 300mg  po q8 Vancomycin 125mg  po q6 - continue - not absorbed when given po  Vancomycin IV - hold check trough in AM   Height: 5\' 7"  (170.2 cm) Weight: 195 lb (88.451 kg) IBW/kg (Calculated) : 66.1  Temp (24hrs), Avg:98 F (36.7 C), Min:97.8 F (36.6 C), Max:98.5 F (36.9 C)   Recent Labs Lab 08/31/15 0427  09/01/15 0442 09/01/15 1600 09/01/15 2106 09/02/15 0215 09/02/15 0955 09/02/15 1828 09/03/15 0431  WBC 9.8  < > 12.1* 12.4*  --  19.4* 23.3* 20.8* 20.0*  CREATININE 1.33*  --  1.26*  --  1.40* 2.01*  --   --  3.89*  < > = values in this interval not displayed.  Estimated Creatinine Clearance: 24.9 mL/min (by C-G formula based on Cr of 3.89).    Allergies  Allergen Reactions  . Phytonadione Other (See Comments)    Patient has LVAD: please check with LVAD coordinator on call or LVAD MD on call before reversal of anticoagulation with vit k    Antimicrobials this admission: 5/8 nafcillin >> 5/15 5/8 vancomycin po >>   5/9 Rifampin>> 5/15 imipenem>> 5/15 Vanc IV >>  Dose adjustments this admission:   Microbiology results: 5/2 OSH MSSA 5/2 OSH Cdiff 5/9 BCx: ngtd 5/9 Wound Cx: MSSA 5/8 MRSA PCR: neg  Bonnita Nasuti Pharm.D. CPP, BCPS Clinical Pharmacist [phone] 09/03/2015 9:55 AM

## 2015-09-03 NOTE — Consult Note (Addendum)
[name] [name] Admit Date: 08/26/2015 09/03/2015 [name] [name] Requesting Physician:  [place]  Reason for Consult:  AKI HPI:  45 year old [name] seen at the request of Dr. Haroldine Laws for the evaluation of an elevated serum creatinine and acute kidney injury. Patient has a complicatedpast history. 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 He also has chronic atrial fibrillation. He was transferred to [facility] from an outside facility on 5/8 where he had presented with fever, chills, and abdominal pain and while initially treated for pyelonephritis was found to have an abscess and infection of his LVAD driveline. At the outside hospital he also was diagnosed with Clostridium difficile colitis for which she was placed on oral vancomycin. Further workup demonstrated MSSA bacteremia and he was originally treated with oral vancomycin and parenteral nafcillin. Infectious diseases was involved at the outside facility as well as here. Trans-esophageal echocardiogram demonstrated no vegetations. He has gone for 3 debridements in the xiphoid region with CT surgery. Yesterday he developed worsening hypotension for which she was placed on norepinephrine and vasopressin. [name] [name] biotics have been broadened to include both IV and oral vancomycin, imipenem, rifampin. Urinalysis on 5/8 demonstrated no pyuria, 6-30 erythrocytes per high-powered field and 1+ protein. He has not had renal imaging during this stay. He has developed anuria over the past 12 hours with little response to 80 mg of IV Lasix.  No contrasted studies. No nonsteroidals. Hypotension as mentioned above has been present. He received losartan through 5/13, now stopped. Differential on CBC today without eosinophilia. He remains pressor dependent. Currently has no specific complaints or concerns.  CREAT (mg/dL)  Date Value   02/01/2015 0.92   CREATININE, SER (mg/dL)  Date Value  09/03/2015 3.89*  09/02/2015 2.01*  09/01/2015 1.40*  09/01/2015 1.26*  08/31/2015 1.33*  08/30/2015 1.43*  08/29/2015 1.00  08/29/2015 1.40*  08/28/2015 1.16  08/27/2015 1.20  ] I/Os: I/O last 3 completed shifts: In: 7090.9 [P.O.:60; I.V.:3544.9; Blood:936; IV Piggyback:2550] Out: 120 [Urine:120]  ROS Balance of 12 systems is negative w/ exceptions as above  PMH  Past Medical History  Diagnosis Date  . Chronic systolic heart failure (HCC)     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%)  . Atrial fibrillation -parosysmal      Rx w amiodarone  . Noncompliance     H/O  MEDICAL NONCOMPLIANCE  . 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 Tricuspid valve regurgitation     SEVERE  . Severe mitral regurgitation   . Polymorphic ventricular tachycardia (HCC)     RECURRENT WITH APPROPRIATE SHOCK THERAPY IN THE PAST  . Ventricular fibrillation (HCC)     WITH APPROPRIATE SHOCK THERAPY IN THE PAST  . Hypertension   . Gout   . RA (rheumatoid arthritis) ([place])   . Automatic implantable cardiac defibrillator -BSX     single chamber  . GI bleed -massive     11 Units 2012  . Elevated LFTs   . H/O hyperthyroidism   . CHF (congestive heart failure) ([place])   . AKI (acute kidney injury) ([place])   . AICD (automatic cardioverter/defibrillator) present    [employer]  Past Surgical History  Procedure Laterality Date  . Cardiac catheterization  06/2006    RIGHT HEART CATH SHOWING SEVERE BIVENTRICUALR CHF WITH MARKED FILLING AND PRESSURES  . Insert / replace / remove pacemaker  GUIDANT HE ICD MODEL 2180, SERIAL # [id number]  . Cholecystectomy    . Implantable cardioverter defibrillator generator change N/A 07/01/2011    Procedure: IMPLANTABLE CARDIOVERTER DEFIBRILLATOR GENERATOR CHANGE;  Surgeon: Deboraha Sprang, MD;  Location: [facility] CATH LAB;  Service: Cardiovascular;  Laterality: N/A;  . Tee  without cardioversion N/A 12/12/2014    Procedure: TRANSESOPHAGEAL ECHOCARDIOGRAM (TEE);  Surgeon: Jerline Pain, MD;  Location: [place];  Service: Cardiovascular;  Laterality: N/A;  . Cardiac catheterization N/A 12/14/2014    Procedure: Right Heart Cath;  Surgeon: Jolaine Artist, MD;  Location: [place] CV LAB;  Service: Cardiovascular;  Laterality: N/A;  . Cardiac catheterization N/A 12/14/2014    Procedure: IABP Insertion;  Surgeon: Jolaine Artist, MD;  Location: [place] CV LAB;  Service: Cardiovascular;  Laterality: N/A;  . Insertion of implantable left ventricular assist device N/A 12/20/2014    Procedure: INSERTION OF IMPLANTABLE LEFT VENTRICULAR ASSIST DEVICE;  Surgeon: Ivin Poot, MD;  Location: [place];  Service: Open Heart Surgery;  Laterality: N/A;  CIRC ARREST  NITRIC OXIDE  . Tee without cardioversion N/A 12/20/2014    Procedure: TRANSESOPHAGEAL ECHOCARDIOGRAM (TEE);  Surgeon: Ivin Poot, MD;  Location: [place];  Service: Open Heart Surgery;  Laterality: N/A;  . Sternal wound debridement N/A 08/27/2015    Procedure: WOUND IRRIGATION AND DEBRIDEMENT;  Surgeon: Ivin Poot, MD;  Location: [place];  Service: Thoracic;  Laterality: N/A;  . Application of wound vac N/A 08/27/2015    Procedure: APPLICATION OF WOUND VAC;  Surgeon: Ivin Poot, MD;  Location: [place];  Service: Thoracic;  Laterality: N/A;  . Sternal wound debridement N/A 08/29/2015    Procedure: DEBRIDEMENT OF chest wound;  Surgeon: Ivin Poot, MD;  Location: [place];  Service: Thoracic;  Laterality: N/A;  . Application of wound vac N/A 08/29/2015    Procedure: WOUND VAC CHANGE;  Surgeon: Ivin Poot, MD;  Location[facility] OR;  Service: Thoracic;  Laterality: N/A;   FH  Family History  Problem Relation Age of Onset  . Heart failure Brother    SH  reports that he has never smoked. He has never used smokeless tobacco. He reports that he does not drink alcohol or use illicit drugs. Allergies   Allergies  Allergen Reactions  . Phytonadione Other (See Comments)    Patient has LVAD: please check with LVAD coordinator on call or LVAD MD on call before reversal of anticoagulation with vit k   Home medications Prior to Admission medications   Medication Sig Start Date End Date Taking? Authorizing Provider  allopurinol (ZYLOPRIM) 300 MG tablet Take 1 tablet (300 mg total) by mouth daily. 01/08/15  Yes Amy D Clegg, NP  amiodarone (PACERONE) 200 MG tablet Take 0.5 tablets (100 mg total) by mouth daily. 05/16/15  Yes Larey Dresser, MD  aspirin EC 81 MG tablet Take 81 mg by mouth daily.   Yes Historical Provider, MD  citalopram (CELEXA) 20 MG tablet Take 1 tablet (20 mg total) by mouth daily. 08/14/15  Yes Amy D Clegg, NP  colchicine 0.6 MG tablet Take 1 tablet (0.6 mg total) by mouth 2 (two) times daily as needed (as needed for gout pain). 01/14/15  Yes Jolaine Artist, MD  digoxin (LANOXIN) 0.125 MG tablet Take 1 tablet (0.125 mg total) by mouth daily. 08/14/15  Yes Amy D Clegg, NP  ferrous sulfate 325 (65 FE) MG tablet Take 1 tablet (325 mg total) by mouth 2 (  two) times daily with a meal. 05/22/15  Yes Jolaine Artist, MD  furosemide (LASIX) 40 MG tablet Take 40 mg by mouth 2 (two) times a week. 07/10/15  Yes Historical Provider, MD  hydrALAZINE (APRESOLINE) 100 MG tablet Take 1 tablet (100 mg total) by mouth every 8 (eight) hours. 05/21/15  Yes Amy D Clegg, NP  lisinopril (PRINIVIL,ZESTRIL) 20 MG tablet Take 1 tablet (20 mg total) by mouth daily. 07/10/15  Yes Larey Dresser, MD  nystatin (MYCOSTATIN) 100000 UNIT/ML suspension Take 5 mLs by mouth 2 (two) times daily. 08/26/15 09/05/15 Yes Historical Provider, MD  pantoprazole (PROTONIX) 40 MG tablet Take 1 tablet (40 mg total) by mouth daily. 08/12/15  Yes Amy D Clegg, NP  potassium chloride SA (K-DUR,KLOR-CON) 20 MEQ tablet Take 40 mEq by mouth 2 (two) times a week. 08/26/15  Yes Historical Provider, MD  pregabalin (LYRICA) 75 MG capsule Take 1  capsule (75 mg total) by mouth 2 (two) times daily. 08/06/15  Yes Deboraha Sprang, MD  sildenafil (REVATIO) 20 MG tablet Take 1 tablet (20 mg total) by mouth 3 (three) times daily. 01/08/15  Yes Amy D Clegg, NP  spironolactone (ALDACTONE) 25 MG tablet Take 0.5 tablets (12.5 mg total) by mouth daily. 01/14/15  Yes Jolaine Artist, MD  vancomycin (VANCOCIN) 125 MG capsule Take by mouth.  08/26/15  Yes Historical Provider, MD  warfarin (COUMADIN) 2 MG tablet Take 4 mg daily except 6 mg on Saturdays: may adjust just as needed per [place] [facility] Patient taking differently: Take 4 mg by mouth daily at 6 PM.  05/21/15  Yes Amy D Clegg, NP    Current Medications Scheduled Meds: . allopurinol  300 mg Oral Daily  . amiodarone  100 mg Oral Daily  . citalopram  20 mg Oral Daily  . docusate sodium  200 mg Oral Daily  . ferrous sulfate  325 mg Oral BID WC  . guaiFENesin  600 mg Oral BID  . imipenem-cilastatin  250 mg Intravenous Q6H  . pantoprazole  40 mg Oral Daily  . polyethylene glycol  17 g Oral Once  . pregabalin  75 mg Oral BID  . rifampin  300 mg Oral Q8H  . sodium chloride flush  10-40 mL Intracatheter Q12H  . sodium chloride flush  3 mL Intravenous Q12H  . tamsulosin  0.4 mg Oral Daily  . vancomycin  125 mg Oral QID   Continuous Infusions: . dextrose 5 % and 0.9% NaCl 50 mL/hr at 09/03/15 1038  . feeding supplement (NEPRO CARB STEADY)    . norepinephrine (LEVOPHED) Adult infusion 15 mcg/min (09/03/15 0900)  . vasopressin (PITRESSIN) infusion - *FOR SHOCK* 0.03 Units/min (09/03/15 1007)   PRN Meds:.sodium chloride, bisacodyl **OR** bisacodyl, fentaNYL (SUBLIMAZE) injection, ondansetron, oxyCODONE, sodium chloride flush, sodium chloride flush, traMADol  CBC  Recent Labs Lab 09/02/15 0955 09/02/15 1828 09/03/15 0431  WBC 23.3* 20.8* 20.0*  NEUTROABS  --   --  17.8*  HGB 8.0* 9.6* 8.5*  HCT 23.5* 28.6* 23.8*  MCV 65.8* 69.4* 69.0*  PLT 357 295 [id number]   Basic Metabolic Panel  Recent  Labs Lab 08/28/15 0450 08/29/15 0508 08/29/15 0756 08/30/15 0459 08/31/15 0427 09/01/15 0442 09/01/15 2106 09/02/15 0215 09/03/15 0431  NA 141 139 142 141 138 138 139 135 134*  K 3.4* 3.3* 3.1* 4.0 3.5 3.3* 3.6 3.7 4.0  CL 107 104 104 106 106 105 104 105 102  CO2 23 23  --  22 22 23   --  21* 18*  GLUCOSE 83 92 88 66 77 73 103* 116* 107*  BUN 14 10 10 11 10  <5* 9 10 17   CREATININE 1.16 1.40* 1.00 1.43* 1.33* 1.26* 1.40* 2.01* 3.89*  CALCIUM 8.1* 8.5*  --  8.6* 8.1* 8.2*  --  7.8* 7.5*  PHOS  --   --   --   --   --   --   --   --  6.7*    Physical Exam  Blood pressure 108/74, pulse 73, temperature 98.5 F (36.9 C), temperature source Oral, resp. rate 18, height 5\' 7"  (1.702 m), weight 88.451 kg (195 lb), SpO2 96 %. GEN: resting, opens eyes to voice and converses ENT: NCAT EYES: EOMI, muddy sclera CV: Continuous home present, minimal audible S1 and S2 PULM: Clear bilaterally ABD: Soft, nontender SKIN: Clean bandages over xiphoid region EXT: Trace to 1+ edema Foley catheter present  Renal Studies Urine Sediment 5/16: Numerous darkly pigmented granular and degenerating cellular casts, abundant scattered pigmented granular debris, numerous broad waxy cast, abundant transitional cells and squamous cells. Scattered are RTEs No erythrocytes with dysmorphic features.  No WBC casts.  Sediment consistent with ATN, features of CKD  Assessment 65M normal baseline SCr admitted with MSSA Driveline Infection and Abscess, C diff colitis, now with septic shock, and AKI  1. AKI, anuric with Urine sediment suggestive of ATN 1. Likely ATN, consider rhabdo with brisk inc in SCr overnight.  Doubt AIN but had significant PCN Exposrue 2. Needs CK, UA, Renal US 3. No CRRT needs at current time K and HCO3 ok 4. Not responsive to lasix at this point 5. Daily weights, Daily Renal Panel, Strict I/Os, Avoid nephrotoxins (NSAIDs, judicious IV Contrast)  2. MSSA Bacteremia and LVAD Driveline Infection:  per TCTS, AHF, RCID; TEE w/o vegetation 3. Septic Shock on Imi, Rifampin, Vanc PO/IV 4. NICM, chronic sHF; LVAD present 5. Anemia in AKI 6. C Dificile colitis 7. AFib on ami  Plan 1. Check USed, UA, Renal US, CK 2. No RRT needs at current time 3. Cont supporitive measures   Pearson Grippe MD [phone] pgr 09/03/2015, 10:[name] AM

## 2015-09-03 NOTE — Progress Notes (Signed)
[place] for Infectious Disease    Date of Admission:  08/26/2015   Total days of antibiotics 9        Day 2 imi (previously on nafcillin)        Day 8 rif        Day 8 oral vanco   ID: [name] is a 45 y.o. 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 TEE on 5/11 negative Active Problems:   Left ventricular assist device (LVAD) complication   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HCC)   Abscess of abdominal wall   Acute on chronic systolic (congestive) heart failure (HCC)   Enteritis due to Clostridium difficile   Staphylococcus aureus bacteremia   Septic shock (HCC)    Subjective: Remains afebrile, but hypotensive yesterday requiring 2 pressors, 2 units RBC,  oligouric, still loose stools (2 last night, and 1 during day shift)  24hr event = leukocytosis of 20k unchanged, worsening cr function, up to 3.89 from 2 in 24hr. Underwent bedside debridement  Medications:  . sodium chloride   Intravenous Once  . allopurinol  300 mg Oral Daily  . amiodarone  100 mg Oral Daily  . citalopram  20 mg Oral Daily  . docusate sodium  200 mg Oral Daily  . ferrous sulfate  325 mg Oral BID WC  . guaiFENesin  600 mg Oral BID  . imipenem-cilastatin  250 mg Intravenous Q6H  . pantoprazole  40 mg Oral Daily  . polyethylene glycol  17 g Oral Once  . pregabalin  75 mg Oral BID  . rifampin  300 mg Oral Q8H  . sodium chloride flush  10-40 mL Intracatheter Q12H  . sodium chloride flush  3 mL Intravenous Q12H  . tamsulosin  0.4 mg Oral Daily  . vancomycin  125 mg Oral QID    Objective: Vital signs in last 24 hours: Temp:  [97.8 F (36.6 C)-98.5 F (36.9 C)] 98.5 F (36.9 C) (05/16 0730) Pulse Rate:  [68-80] 73 (05/16 0900) Resp:  [12-27] 18 (05/16 0900) BP: (74-124)/(38-79) 108/74 mmHg (05/15  1700) SpO2:  [90 %-100 %] 96 % (05/16 0900) Arterial Line BP: (89-126)/(63-76) 125/76 mmHg (05/16 0900) Weight:  [195 lb (88.451 kg)] 195 lb (88.451 kg) (05/16 0630) Physical Exam  Constitutional: He is oriented to person, place, and time. He appears well-developed but fatigued. No distress.  HENT: scleral icterus, with edematous conjunctiva, ng in place Mouth/Throat: Oropharynx is clear and moist. No oropharyngeal exudate.  Cardiovascular: +LVAD motor heard throughout precordium Pulmonary/Chest: Effort normal and breath sounds normal. No respiratory distress. He has no wheezes.  Abdominal: Soft.  Ext: warm Skin: Skin is warm and dry. No rash noted. No erythema.     Lab Results  Recent Labs  09/02/15 0215  09/02/15 1828 09/03/15 0431  WBC 19.4*  < > 20.8* 20.0*  HGB 9.3*  < > 9.6* 8.5*  HCT 27.7*  < > 28.6* 23.8*  NA 135  --   --  134*  K 3.7  --   --  4.0  CL 105  --   --  102  CO2 21*  --   --  18*  BUN 10  --   --  17  CREATININE 2.01*  --   --  3.89*  < > = values in this interval not  displayed. Liver Panel  Recent Labs  09/02/15 0955 09/03/15 0431  PROT 5.5* 6.0*  ALBUMIN 2.1* 2.1*  AST 37 39  ALT 31 27  ALKPHOS 89 96  BILITOT 9.2* 11.4*  BILIDIR 5.9*  --   IBILI 3.3*  --     Microbiology: 5/15 trach aspirate cx: few GNR 5/9 wound cx; MSSA Studies/Results: Dg Chest Port 1 View  09/03/2015  CLINICAL DATA:  Left ventricular assist device, followup EXAM: PORTABLE CHEST 1 VIEW COMPARISON:  Portable chest x-ray of 09/02/2015 and 08/30/2015 FINDINGS: There is little change to perhaps minimal increase in linear opacities at the lung bases most consistent with bibasilar linear atelectasis. Cardiomegaly is stable. AICD leads and left ventricular assist device remain. Median sternotomy sutures are noted. IMPRESSION: Minimal increase in bibasilar linear atelectasis. Electronically Signed   By: Ivar Drape M.D.   On: 09/03/2015 08:07   Dg Chest Port 1  View  09/02/2015  CLINICAL DATA:  Acute on chronic CHF, aspiration, left ventricular assist device in place. EXAM: PORTABLE CHEST 1 VIEW COMPARISON:  Portable chest x-ray of Aug 30, 2015 FINDINGS: The lungs are reasonably well inflated. There is subtle retrocardiac density on the left that is more conspicuous today. There is no significant pleural effusion and there is no pneumothorax. The cardiac silhouette remains enlarged. The central pulmonary vascularity is mildly prominent. The left ventricular assist device appears to be in reasonable position and stable. The permanent pacemaker defibrillator is in stable position. The left atrial appendage clip is unchanged. The sternal wires are intact. IMPRESSION: Slight interval increase in retrocardiac density on the left consistent with worsening atelectasis or pneumonia. There is no significant pleural effusion. Mild cardiomegaly without pulmonary interstitial or alveolar edema. The support devices appear to be in stable position. Electronically Signed   By: David  Martinique M.D.   On: 09/02/2015 07:33   Dg Abd Portable 1v  09/02/2015  CLINICAL DATA:  Ileus. EXAM: PORTABLE ABDOMEN - 1 VIEW COMPARISON:  08/28/2015 FINDINGS: Left ventricular assist device is partially visualized. There is increased gas in small and large bowel loops throughout the abdomen, however no bowel dilatation is identified. No gross intraperitoneal free air is identified on this limited supine examination. No acute osseous abnormality is seen. IMPRESSION: Increased bowel gas without evidence of obstruction. Electronically Signed   By: Logan Bores M.D.   On: 09/02/2015 08:28     Assessment/Plan: 45yo M with MSSA LVAD drive line and abdominal wall infection complicated with c.difficile, oligouric AKI, hypotension with pressors and aspirate pneumonia  Aspirate pneumonia = continue with imipenem. Await GNR identification  Worsening AKI = currently on pressors. Would renally dose imipenem.  vanco is not systemically absorbed from the gi tract  cdifficile infection= continue with 125mg  Q6 by mouth  MSSA LVAD drive line/abdominal wall infection = continue with imipenem which would cover MSSA, as well as rifampin but may consider doing only 6 wk in the acute setting  [facility], [facility] for Infectious Diseases Cell: [phone] Pager: [phone]  09/03/2015, 11:05 AM

## 2015-09-03 NOTE — Progress Notes (Signed)
Patient ID: [name], male   DOB: 08/12/1970, 45 y.o.   MRN: [id number]    HeartMate 2 Rounding Note  Subjective:    Admitted from [facility] in [place] with driveline abscess. Blood cultures --> MSSA and has C diff. Remains on Nafcillin  and oral vancomycin.   08/27/15 S/P I &D Epigastric with hardware exposed and VAC placement.  5/11 repeat I&D and wound vac replacement.  5/11 TEE no vegetations on valves or ICD wire   Yesterday started on norepi and vasopressin. Also received 2PUPRBCs.  Having increased exudate from wound. Only had 20 cc urine output. Creatinine Up  1.4>2.0>3.89. Had loose BM this morning.   Complaining of fatigue and body aches.  Denies SOB.   LVAD INTERROGATION:  HeartMate II LVAD:  Flow 4.2 liters/min, speed 9200, power 5.0  PI 7.7 . No PI events.   Objective:    Vital Signs:   Temp:  [97.8 F (36.6 C)-98.2 F (36.8 C)] 98.2 F (36.8 C) (05/16 0351) Pulse Rate:  [68-81] 69 (05/16 0530) Resp:  [12-27] 17 (05/16 0530) BP: (56-124)/(30-81) 108/74 mmHg (05/15 1700) SpO2:  [90 %-100 %] 96 % (05/16 0530) Arterial Line BP: (89-122)/(63-72) 122/72 mmHg (05/16 0530) Weight:  [195 lb (88.451 kg)] 195 lb (88.451 kg) (05/16 0630) Last BM Date: 09/03/15 Mean arterial Pressure  70-80s    Intake/Output:   Intake/Output Summary (Last 24 hours) at 09/03/15 0655 Last data filed at 09/03/15 0500  Gross per 24 hour  Intake 4750.58 ml  Output    100 ml  Net 4650.58 ml     Physical Exam: CVP 6-7 personally checked.  General: In bed. Very weak.  HEENT: Sceleral icterus Neck: supple.  Carotids 2+ bilat; no bruits. No lymphadenopathy or thryomegaly appreciated. Cor: Mechanical heart sounds with LVAD hum present. Lungs: clear  Abdomen: soft, tender, distended. No hepatosplenomegaly. No bruits or masses. Good bowel sounds. Mid epigastric dressing blood stained Driveline: C/D/I; securement device intact and driveline incorporated Extremities: no  cyanosis, clubbing, rash, edema R and LLE SCDs.  Neuro: alert & orientedx3, cranial nerves grossly intact. moves all 4 extremities w/o difficulty.  Telemetry: NSR 70s   Labs: Basic Metabolic Panel:  Recent Labs Lab 08/30/15 0459 08/31/15 0427 09/01/15 0442 09/01/15 2106 09/02/15 0215 09/03/15 0431  NA 141 138 138 139 135 134*  K 4.0 3.5 3.3* 3.6 3.7 4.0  CL 106 106 105 104 105 102  CO2 22 22 23   --  21* 18*  GLUCOSE 66 77 73 103* 116* 107*  BUN 11 10 <5* 9 10 17   CREATININE 1.43* 1.33* 1.26* 1.40* 2.01* 3.89*  CALCIUM 8.6* 8.1* 8.2*  --  7.8* 7.5*  MG  --   --   --   --   --  1.8  PHOS  --   --   --   --   --  6.7*    Liver Function Tests:  Recent Labs Lab 08/27/15 1048 08/30/15 0459 09/02/15 0955 09/03/15 0431  AST 44* 63* 37 39  ALT 41 48 31 27  ALKPHOS 114 108 89 96  BILITOT 2.0* 5.9* 9.2* 11.4*  PROT 6.0* 6.6 5.5* 6.0*  ALBUMIN 1.7* 2.1* 2.1* 2.1*   No results for input(s): LIPASE, AMYLASE in the last 168 hours.  Recent Labs Lab 09/02/15 1045  AMMONIA 40*    CBC:  Recent Labs Lab 09/01/15 1600 09/01/15 2106 09/02/15 0215 09/02/15 0955 09/02/15 1828 09/03/15 0431  WBC 12.4*  --  19.4* 23.3* 20.8* 20.0*  NEUTROABS  --   --   --   --   --  PENDING  HGB 8.0* 10.5* 9.3* 8.0* 9.6* 8.5*  HCT 23.2* 31.0* 27.7* 23.5* 28.6* 23.8*  MCV 65.5*  --  66.6* 65.8* 69.4* 69.0*  PLT 274  --  377 357 295 PENDING    INR:  Recent Labs Lab 08/28/15 0450 08/29/15 0604 08/30/15 0459 09/01/15 0451  INR 1.54* 1.37 1.57* 1.54*    Other results:    Imaging: Dg Chest Port 1 View  09/02/2015  CLINICAL DATA:  Acute on chronic CHF, aspiration, left ventricular assist device in place. EXAM: PORTABLE CHEST 1 VIEW COMPARISON:  Portable chest x-ray of Aug 30, 2015 FINDINGS: The lungs are reasonably well inflated. There is subtle retrocardiac density on the left that is more conspicuous today. There is no significant pleural effusion and there is no pneumothorax.  The cardiac silhouette remains enlarged. The central pulmonary vascularity is mildly prominent. The left ventricular assist device appears to be in reasonable position and stable. The permanent pacemaker defibrillator is in stable position. The left atrial appendage clip is unchanged. The sternal wires are intact. IMPRESSION: Slight interval increase in retrocardiac density on the left consistent with worsening atelectasis or pneumonia. There is no significant pleural effusion. Mild cardiomegaly without pulmonary interstitial or alveolar edema. The support devices appear to be in stable position. Electronically Signed   By: David  Martinique M.D.   On: 09/02/2015 07:33   Dg Abd Portable 1v  09/02/2015  CLINICAL DATA:  Ileus. EXAM: PORTABLE ABDOMEN - 1 VIEW COMPARISON:  08/28/2015 FINDINGS: Left ventricular assist device is partially visualized. There is increased gas in small and large bowel loops throughout the abdomen, however no bowel dilatation is identified. No gross intraperitoneal free air is identified on this limited supine examination. No acute osseous abnormality is seen. IMPRESSION: Increased bowel gas without evidence of obstruction. Electronically Signed   By: Logan Bores M.D.   On: 09/02/2015 08:28     Medications:     Scheduled Medications: . allopurinol  300 mg Oral Daily  . amiodarone  100 mg Oral Daily  . citalopram  20 mg Oral Daily  . docusate sodium  200 mg Oral Daily  . ferrous sulfate  325 mg Oral BID WC  . guaiFENesin  600 mg Oral BID  . imipenem-cilastatin  500 mg Intravenous Q8H  . pantoprazole  40 mg Oral Daily  . polyethylene glycol  17 g Oral Once  . pregabalin  75 mg Oral BID  . rifampin  300 mg Oral Q8H  . sodium chloride flush  10-40 mL Intracatheter Q12H  . sodium chloride flush  3 mL Intravenous Q12H  . tamsulosin  0.4 mg Oral Daily  . vancomycin  125 mg Oral QID  . vancomycin  750 mg Intravenous Q12H    Infusions: . dextrose 5 % and 0.9% NaCl 50 mL/hr at  09/02/15 1600  . norepinephrine (LEVOPHED) Adult infusion 16 mcg/min (09/03/15 0500)  . vasopressin (PITRESSIN) infusion - *FOR SHOCK* 0.03 Units/min (09/03/15 0500)    PRN Medications: sodium chloride, bisacodyl **OR** bisacodyl, fentaNYL (SUBLIMAZE) injection, ondansetron, oxyCODONE, sodium chloride flush, sodium chloride flush, traMADol   Assessment:   1. LVAD Complication- Driveline abscess 2. MSSA bacteremia -> septic shock 3. Acute/Chronic systolic HF s/p VAD placement 9/16 4. C. Difficile colitis 5. 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 Microcytic anemia 8. Urinary retention 9. Hypokalelmia  10. HTN 11. Anemia 12. Vomiting.  13. Sever Malnutrition.   51. ARF  Plan/Discussion:    S/P I&D LVAD driveline abscess. Exposed hardware. ID following. Continue po Vanc for C diff, imipenem, and Rifampin. All blood cultures NGTD. Underwent deeper debridement and wound vac replacement 5/11. Abd abscess with few staphylococcus aureus. No vegetations on TEE.  Hypotension improving with Vasopressin and Norepi. Maps 70-80s. Renal function much worse.Minimal urine output. Will likely need short term dialysis. He is ok with dialysis if needed.   OR time pending. Increased exudate from wound with some purulence reported. Not ready for flap. WBC trending up 12>19>20.  CXR pending.   C. Diff- Still having loose stools. Continue oral vanc.   Had 2UPRBCs. 5/15 with hemoglobin up to 9.6 but today back down to 8.5 . Will need more blood. Off heparin with increased exudate from wound.   Starting TPN today. Prealbumin 8.8   VAD parameters stable.    I reviewed the LVAD parameters from today, and compared the results to the patient's prior recorded data.  No programming changes were made.  The LVAD is functioning within specified parameters.  The patient performs LVAD self-test daily.  LVAD interrogation was negative for any significant power changes, alarms or PI  events/speed drops.  LVAD equipment check completed and is in good working order.  Back-up equipment present.   LVAD education done on emergency procedures and precautions and reviewed exit site care.  Length of Stay: [place] NP-C  09/03/2015, 6:55 AM  VAD Team --- VAD ISSUES ONLY--- Pager [phone] (7am - 7am)  Advanced Heart Failure Team  Pager [phone] (M-F; 7a - 4p)  Please contact [facility] for night-coverage after hours (4p -7a ) and weekends on [email or web address]   Patient seen and examined with Darrick Grinder, NP. We discussed all aspects of the encounter. I agree with the assessment and plan as stated above.   Had profound hypotension yesterday. Now improved on norepi and vasopressin. MAPs in 80s. Unfortunately has developed ATN and nearly anuric. Suspect he may need CVVHD. Renal contacted.  Continues to bleed from wound site and we are transfusing. Dr. Prescott Gum repacked today. Heparin on hold. Will need to restart as soon as possible   Remains on  Imipenen and oral rifampin/vanc. Starting TPN today.  VAD parameters stable.   The patient is critically ill with multiple organ systems failure and requires high complexity decision making for assessment and support, frequent evaluation and titration of therapies, application of advanced monitoring technologies and extensive interpretation of multiple databases.   Critical Care Time devoted to patient care services described in this note is 35 Minutes.  Dawnisha Marquina,MD 3:39 PM

## 2015-09-03 NOTE — Progress Notes (Signed)
Pharmacy Antibiotic Note  [name] is a 45 y.o. male with HF s/p LVAD 9/16  transferred from OSH after contracting CDiff and developing MSSA abscess and drive line infection Pharmacy has been consulted for Nafcillin, Rifampin, imipenem and vancomycin po dosing. -SCr= 4.68 with trend up, estimated CrCl ~ 20  Plan: -adjust imipenem to 500mg  IV q12h -No further antibiotic dose changes needed  Height: 5\' 7"  (170.2 cm) Weight: 195 lb (88.451 kg) IBW/kg (Calculated) : 66.1  Temp (24hrs), Avg:98.3 F (36.8 C), Min:97.8 F (36.6 C), Max:98.6 F (37 C)   Recent Labs Lab 09/01/15 0442  09/01/15 2106 09/02/15 0215 09/02/15 0955 09/02/15 1828 09/03/15 0431 09/03/15 1300  WBC 12.1*  < >  --  19.4* 23.3* 20.8* 20.0* 18.3*  CREATININE 1.26*  --  1.40* 2.01*  --   --  3.89* 4.68*  < > = values in this interval not displayed.  Estimated Creatinine Clearance: 20.7 mL/min (by C-G formula based on Cr of 4.68).    Allergies  Allergen Reactions  . Phytonadione Other (See Comments)    Patient has LVAD: please check with LVAD coordinator on call or LVAD MD on call before reversal of anticoagulation with vit k    Antimicrobials this admission: 5/8 nafcillin >> 5/15 5/8 vancomycin po >>  5/9 Rifampin>> 5/15 imipenem>> 5/15 Vanc IV >>  Dose adjustments this admission:   Microbiology results: 5/2 OSH MSSA 5/2 OSH Cdiff 5/9 BCx: ngtd 5/9 Wound Cx: MSSA 5/8 MRSA PCR: neg  Hildred Laser, Pharm D 09/03/2015 6:15 PM

## 2015-09-03 NOTE — Progress Notes (Addendum)
Initial Nutrition Assessment  DOCUMENTATION CODES:   Not applicable  INTERVENTION:    Once CORTRAK tube placed & verified, initiate Nepro formula at 15 ml/hr and increase by 10 ml every 4 hours to goal rate of 45 ml/hr with Prostat liquid protein 30 ml TID via tube  Total above TF regimen to provide 2244 kcals, 132 gm protein, 785 ml of free water daily  NUTRITION DIAGNOSIS:   Inadequate oral intake related to poor appetite as evidenced by  (RN report)  GOAL:   Patient will meet greater than or equal to 90% of their needs  MONITOR:   TF tolerance, PO intake, Labs, Weight trends, I & O's  REASON FOR ASSESSMENT:   Consult Assessment of nutrition requirement/status  ASSESSMENT:   45 yo Male admitted for treatment of a sternal / chest wound. The patient is currently in the Unit and recovering from surgery earlier this week. He had an LVAD placed. He had a trip to [place] and came back with concerns of infection at the site of the pump. He was taken to the OR for debridement. There was purulence at the site. Irrigation and debridement was done. Acell and the VAC was placed. He is recovering now. Attempts are being made to see if he is a candidate for transplant. Will need coverage over the pump. Pictures seen and significant amount exposed.  Patient s/p procedures 5/11: DEBRIDEMENT OF CHEST WOUND WOUND VAC CHANGE  APPLICATION OF A-CELL POWDER  RD unable to obtain nutrition hx >> pt fatigued. Spoke with RN >> not eating well. Plan is to place CORTRAK small bore feeding tube for nutrition support. Nutrition focused physical exam completed.  No muscle or subcutaneous fat depletion noticed.  Verbal with Read Back order received per Darrick Grinder, NP to adjust TF regimen as needed.  Diet Order:  Diet heart healthy/carb modified Room service appropriate?: Yes; Fluid consistency:: Thin  Skin:  Wound (see comment) (chest wound)  Last BM:  5/16  Height:   Ht Readings from  Last 1 Encounters:  08/26/15 5\' 7"  (1.702 m)    Weight:   Wt Readings from Last 1 Encounters:  09/03/15 195 lb (88.451 kg)    Ideal Body Weight:  67.2 kg  BMI:  Body mass index is 30.53 kg/(m^2).  Estimated Nutritional Needs:   Kcal:  2100-2300  Protein:  120-130 gm  Fluid:  per MD  EDUCATION NEEDS:   No education needs identified at this time  Arthur Holms, RD, LDN Pager #: [phone] After-Hours Pager #: [phone]

## 2015-09-03 NOTE — Progress Notes (Signed)
5 Days Post-Op Procedure(s) (LRB): DEBRIDEMENT OF chest wound (N/A) WOUND VAC CHANGE (N/A) Subjective: Wound irrigation,dressing change with Acell powder done at bedside in ICU with IV monitored sedation, sterile technique with OR gown and drape  Little improvement since last dressing change 4 days ago- but he has had zero nutrition Skin bleeders cauterized, suture ligation Heparin still on hold  Cortrack feeding tube placed to begin Nepro TF later today  Objective: Vital signs in last 24 hours: Temp:  [97.8 F (36.6 C)-98.6 F (37 C)] 98.3 F (36.8 C) (05/16 1330) Pulse Rate:  [68-80] 76 (05/16 1400) Cardiac Rhythm:  [-] Normal sinus rhythm (05/16 0800) Resp:  [12-27] 18 (05/16 1400) BP: (76-124)/(64-79) 108/74 mmHg (05/15 1700) SpO2:  [90 %-100 %] 93 % (05/16 1400) Arterial Line BP: (89-150)/(63-83) 119/73 mmHg (05/16 1400) Weight:  [195 lb (88.451 kg)] 195 lb (88.451 kg) (05/16 0630)  Hemodynamic parameters for last 24 hours: CVP:  [4 mmHg-18 mmHg] 11 mmHg  Intake/Output from previous day: 05/15 0701 - 05/16 0700 In: 5036.9 [P.O.:60; I.V.:2819.9; Blood:657; IV Piggyback:1500] Out: 120 [Urine:120] Intake/Output this shift: Total I/O In: 988.1 [I.V.:553.1; Blood:335; IV Piggyback:100] Out: 35 [Urine:35]       Exam    General- alert and comfortable   Lungs- clear without rales, wheezes   Cor- regular rate and rhythm, no murmur , gallop   Abdomen- distended, open wound with clean dressing   Extremities - warm, non-tender, minimal edema   Neuro- oriented, appropriate, no focal weakness   Lab Results:  Recent Labs  09/03/15 0431 09/03/15 1300  WBC 20.0* 18.3*  HGB 8.5* 8.8*  HCT 23.8* 25.9*  PLT 286 PENDING   BMET:  Recent Labs  09/03/15 0431 09/03/15 1300  NA 134* 132*  K 4.0 3.9  CL 102 102  CO2 18* 18*  GLUCOSE 107* 111*  BUN 17 20  CREATININE 3.89* 4.68*  CALCIUM 7.5* 7.3*    PT/INR:  Recent Labs  09/01/15 0451  LABPROT 18.6*  INR 1.54*    ABG    Component Value Date/Time   PHART 7.386 08/29/2015 0759   HCO3 25.2* 08/29/2015 0759   TCO2 22 09/01/2015 2106   ACIDBASEDEF 0.3 12/23/2014 0335   O2SAT 66.1 09/03/2015 0350   CBG (last 3)  No results for input(s): GLUCAP in the last 72 hours.  Assessment/Plan: S/P Procedure(s) (LRB): DEBRIDEMENT OF chest wound (N/A) WOUND VAC CHANGE (N/A) VAD pocket infection MSSA  Sepsis syndrome with acute renal failure, hepatic failure Will cont Antibiotics and start Nepro TF Hold heparin until tomorrow  LOS: 8 days    [name] 09/03/2015

## 2015-09-03 NOTE — Progress Notes (Signed)
Dr. Prescott Gum made aware of morning labs, no urine output overnight, and overall patient status. Orders received to give 1 amp bicarb, 1 albumin, 80mg  IV Lasix, insert foley, draw CBC & BMET at 1300, and to hold PO dose of Vancomycin until after Vancomycin trough drawn. Will continue to closely monitor. Richarda Blade RN

## 2015-09-04 ENCOUNTER — Inpatient Hospital Stay (HOSPITAL_COMMUNITY): Payer: 59

## 2015-09-04 ENCOUNTER — Other Ambulatory Visit: Payer: Self-pay | Admitting: Plastic Surgery

## 2015-09-04 DIAGNOSIS — T8132XA Disruption of internal operation (surgical) wound, not elsewhere classified, initial encounter: Secondary | ICD-10-CM

## 2015-09-04 DIAGNOSIS — T829XXS Unspecified complication of cardiac and vascular prosthetic device, implant and graft, sequela: Secondary | ICD-10-CM

## 2015-09-04 LAB — CULTURE, RESPIRATORY W GRAM STAIN: Special Requests: NORMAL

## 2015-09-04 LAB — PREPARE RBC (CROSSMATCH)

## 2015-09-04 LAB — COMPREHENSIVE METABOLIC PANEL
ALK PHOS: 99 U/L (ref 38–126)
ALT: 31 U/L (ref 17–63)
AST: 46 U/L — AB (ref 15–41)
Albumin: 1.8 g/dL — ABNORMAL LOW (ref 3.5–5.0)
Anion gap: 14 (ref 5–15)
BILIRUBIN TOTAL: 9.4 mg/dL — AB (ref 0.3–1.2)
BUN: 27 mg/dL — AB (ref 6–20)
CALCIUM: 7.4 mg/dL — AB (ref 8.9–10.3)
CHLORIDE: 104 mmol/L (ref 101–111)
CO2: 16 mmol/L — ABNORMAL LOW (ref 22–32)
CREATININE: 5.57 mg/dL — AB (ref 0.61–1.24)
GFR, EST AFRICAN AMERICAN: 13 mL/min — AB (ref >60)
GFR, EST NON AFRICAN AMERICAN: 11 mL/min — AB (ref >60)
Glucose, Bld: 104 mg/dL — ABNORMAL HIGH (ref 65–99)
Potassium: 3.8 mmol/L (ref 3.5–5.1)
Sodium: 134 mmol/L — ABNORMAL LOW (ref 135–145)
TOTAL PROTEIN: 5.9 g/dL — AB (ref 6.5–8.1)

## 2015-09-04 LAB — URINE CULTURE

## 2015-09-04 LAB — GLUCOSE, CAPILLARY
GLUCOSE-CAPILLARY: 105 mg/dL — AB (ref 65–99)
GLUCOSE-CAPILLARY: 112 mg/dL — AB (ref 65–99)
GLUCOSE-CAPILLARY: 94 mg/dL (ref 65–99)
GLUCOSE-CAPILLARY: 110 mg/dL — AB (ref 65–99)
Glucose-Capillary: 114 mg/dL — ABNORMAL HIGH (ref 65–99)
Glucose-Capillary: 122 mg/dL — ABNORMAL HIGH (ref 65–99)

## 2015-09-04 LAB — CBC
HCT: 23 % — ABNORMAL LOW (ref 39.0–52.0)
HCT: 24.3 % — ABNORMAL LOW (ref 39.0–52.0)
Hemoglobin: 7.9 g/dL — ABNORMAL LOW (ref 13.0–17.0)
Hemoglobin: 8.5 g/dL — ABNORMAL LOW (ref 13.0–17.0)
MCH: 24 pg — ABNORMAL LOW (ref 26.0–34.0)
MCH: 24.3 pg — ABNORMAL LOW (ref 26.0–34.0)
MCHC: 34.3 g/dL (ref 30.0–36.0)
MCHC: 35 g/dL (ref 30.0–36.0)
MCV: 69.9 fL — ABNORMAL LOW (ref 78.0–100.0)
MCV: 69.4 fL — AB (ref 78.0–100.0)
PLATELETS: 171 10*3/uL (ref 150–400)
Platelets: 212 10*3/uL (ref 150–400)
RBC: 3.29 MIL/uL — ABNORMAL LOW (ref 4.22–5.81)
RBC: 3.5 MIL/uL — ABNORMAL LOW (ref 4.22–5.81)
RDW: 25.8 % — ABNORMAL HIGH (ref 11.5–15.5)
RDW: 25.2 % — AB (ref 11.5–15.5)
WBC: 13.8 10*3/uL — ABNORMAL HIGH (ref 4.0–10.5)
WBC: 15.6 10*3/uL — AB (ref 4.0–10.5)

## 2015-09-04 LAB — OCCULT BLOOD X 1 CARD TO LAB, STOOL: Fecal Occult Bld: NEGATIVE

## 2015-09-04 LAB — BASIC METABOLIC PANEL
Anion gap: 14 (ref 5–15)
BUN: 32 mg/dL — AB (ref 6–20)
CO2: 15 mmol/L — ABNORMAL LOW (ref 22–32)
Calcium: 7.8 mg/dL — ABNORMAL LOW (ref 8.9–10.3)
Chloride: 106 mmol/L (ref 101–111)
Creatinine, Ser: 6.1 mg/dL — ABNORMAL HIGH (ref 0.61–1.24)
GFR calc Af Amer: 11 mL/min — ABNORMAL LOW (ref >60)
GFR, EST NON AFRICAN AMERICAN: 10 mL/min — AB (ref >60)
GLUCOSE: 106 mg/dL — AB (ref 65–99)
POTASSIUM: 3.6 mmol/L (ref 3.5–5.1)
Sodium: 135 mmol/L (ref 135–145)

## 2015-09-04 LAB — CARBOXYHEMOGLOBIN
CARBOXYHEMOGLOBIN: 1.5 % (ref 0.5–1.5)
METHEMOGLOBIN: 1.3 % (ref 0.0–1.5)
O2 Saturation: 67.7 %
Total hemoglobin: 7.3 g/dL — ABNORMAL LOW (ref 13.5–18.0)

## 2015-09-04 LAB — VANCOMYCIN, TROUGH: Vancomycin Tr: 24 ug/mL — ABNORMAL HIGH (ref 10.0–20.0)

## 2015-09-04 LAB — LACTATE DEHYDROGENASE: LDH: 231 U/L — ABNORMAL HIGH (ref 98–192)

## 2015-09-04 MED ORDER — METOCLOPRAMIDE HCL 5 MG/ML IJ SOLN
10.0000 mg | Freq: Four times a day (QID) | INTRAMUSCULAR | Status: DC
  Administered 2015-09-04 – 2015-09-05 (×4): 10 mg via INTRAVENOUS
  Filled 2015-09-04 (×3): qty 2

## 2015-09-04 MED ORDER — SIMETHICONE 80 MG PO CHEW: 80.0000 mg | CHEWABLE_TABLET | Freq: Four times a day (QID) | ORAL | Status: DC | PRN

## 2015-09-04 MED ORDER — CEFAZOLIN SODIUM-DEXTROSE 2-4 GM/100ML-% IV SOLN
2.0000 g | INTRAVENOUS | Status: AC
  Administered 2015-09-05: 2 g via INTRAVENOUS
  Filled 2015-09-04: qty 100

## 2015-09-04 MED ORDER — SIMETHICONE 80 MG PO CHEW
80.0000 mg | CHEWABLE_TABLET | Freq: Four times a day (QID) | ORAL | Status: DC
  Administered 2015-09-04 – 2015-09-25 (×74): 80 mg via ORAL
  Filled 2015-09-04 (×75): qty 1

## 2015-09-04 MED ORDER — SODIUM CHLORIDE 0.9 % IV SOLN
Freq: Once | INTRAVENOUS | Status: AC
  Administered 2015-09-04: 08:00:00 via INTRAVENOUS

## 2015-09-04 NOTE — Progress Notes (Signed)
Patient ID: [name], male   DOB: 07-02-1970, 45 y.o.   MRN: [id number]    HeartMate 2 Rounding Note  Subjective:    Admitted from [facility] in [place] with driveline abscess. Blood cultures --> MSSA and has C diff. Remains on Nafcillin  and oral vancomycin.   08/27/15 S/P I &D Epigastric with hardware exposed and VAC placement.  5/11 repeat I&D and wound vac replacement.  5/11 TEE no vegetations on valves or ICD wire   Overall he has received 4 UPRBCs. Hgb back down to 7.9. Maps improved off norepi. On vasopressin. BM earlier this morning ? Blood. --> -FOBT was negative   Feels better. Complains of lower extremity weakness. Denies SOB.   LVAD INTERROGATION:  HeartMate II LVAD:  Flow 4.3 liters/min, speed 9200, power 5.0  PI 8.1. No PI events.   Objective:    Vital Signs:   Temp:  [98.3 F (36.8 C)-98.9 F (37.2 C)] 98.9 F (37.2 C) (05/17 0000) Pulse Rate:  [68-83] 82 (05/17 0630) Resp:  [13-24] 20 (05/17 0630) SpO2:  [93 %-98 %] 96 % (05/17 0630) Arterial Line BP: (119-153)/(70-92) 153/92 mmHg (05/17 0630) Weight:  [197 lb 5 oz (89.5 kg)] 197 lb 5 oz (89.5 kg) (05/17 0615) Last BM Date: 09/04/15 Mean arterial Pressure  70-80s    Intake/Output:   Intake/Output Summary (Last 24 hours) at 09/04/15 0703 Last data filed at 09/04/15 0600  Gross per 24 hour  Intake 2653.35 ml  Output    192 ml  Net 2461.35 ml     Physical Exam: CVP 9-10 personally checked.  General: In bed. Very weak.  HEENT: Sceleral icterus Neck: supple.  Carotids 2+ bilat; no bruits. No lymphadenopathy or thryomegaly appreciated. Cor: Mechanical heart sounds with LVAD hum present. Lungs: clear  Abdomen: soft, tender, distended. No hepatosplenomegaly. No bruits or masses. Good bowel sounds. Mid epigastric dressing blood stained Driveline: C/D/I; securement device intact and driveline incorporated Extremities: no cyanosis, clubbing, rash, edema R and LLE SCDs.  Neuro: alert &  orientedx3, cranial nerves grossly intact. moves all 4 extremities w/o difficulty.  Telemetry: NSR 70s   Labs: Basic Metabolic Panel:  Recent Labs Lab 09/01/15 0442 09/01/15 2106 09/02/15 0215 09/03/15 0431 09/03/15 1300 09/04/15 0411  NA 138 139 135 134* 132* 134*  K 3.3* 3.6 3.7 4.0 3.9 3.8  CL 105 104 105 102 102 104  CO2 23  --  21* 18* 18* 16*  GLUCOSE 73 103* 116* 107* 111* 104*  BUN <5* 9 10 17 20  27*  CREATININE 1.26* 1.40* 2.01* 3.89* 4.68* 5.57*  CALCIUM 8.2*  --  7.8* 7.5* 7.3* 7.4*  MG  --   --   --  1.8  --   --   PHOS  --   --   --  6.7*  --   --     Liver Function Tests:  Recent Labs Lab 08/30/15 0459 09/02/15 0955 09/03/15 0431 09/04/15 0411  AST 63* 37 39 46*  ALT 48 31 27 31   ALKPHOS 108 89 96 99  BILITOT 5.9* 9.2* 11.4* 9.4*  PROT 6.6 5.5* 6.0* 5.9*  ALBUMIN 2.1* 2.1* 2.1* 1.8*   No results for input(s): LIPASE, AMYLASE in the last 168 hours.  Recent Labs Lab 09/02/15 1045  AMMONIA 40*    CBC:  Recent Labs Lab 09/02/15 1828 09/03/15 0431 09/03/15 1300 09/03/15 2055 09/04/15 0411  WBC 20.8* 20.0* 18.3* 19.9* 13.8*  NEUTROABS  --  17.8*  --   --   --  HGB 9.6* 8.5* 8.8* 8.6* 7.9*  HCT 28.6* 23.8* 25.9* 24.5* 23.0*  MCV 69.4* 69.0* 70.8* 69.2* 69.9*  PLT 295 286 266 213 212    INR:  Recent Labs Lab 08/29/15 0604 08/30/15 0459 09/01/15 0451  INR 1.37 1.57* 1.54*    Other results:    Imaging: Dg Chest Port 1 View  09/03/2015  CLINICAL DATA:  Left ventricular assist device, followup EXAM: PORTABLE CHEST 1 VIEW COMPARISON:  Portable chest x-ray of 09/02/2015 and 08/30/2015 FINDINGS: There is little change to perhaps minimal increase in linear opacities at the lung bases most consistent with bibasilar linear atelectasis. Cardiomegaly is stable. AICD leads and left ventricular assist device remain. Median sternotomy sutures are noted. IMPRESSION: Minimal increase in bibasilar linear atelectasis. Electronically Signed   By:  Ivar Drape M.D.   On: 09/03/2015 08:07   Dg Chest Port 1 View  09/02/2015  CLINICAL DATA:  Acute on chronic CHF, aspiration, left ventricular assist device in place. EXAM: PORTABLE CHEST 1 VIEW COMPARISON:  Portable chest x-ray of Aug 30, 2015 FINDINGS: The lungs are reasonably well inflated. There is subtle retrocardiac density on the left that is more conspicuous today. There is no significant pleural effusion and there is no pneumothorax. The cardiac silhouette remains enlarged. The central pulmonary vascularity is mildly prominent. The left ventricular assist device appears to be in reasonable position and stable. The permanent pacemaker defibrillator is in stable position. The left atrial appendage clip is unchanged. The sternal wires are intact. IMPRESSION: Slight interval increase in retrocardiac density on the left consistent with worsening atelectasis or pneumonia. There is no significant pleural effusion. Mild cardiomegaly without pulmonary interstitial or alveolar edema. The support devices appear to be in stable position. Electronically Signed   By: David  Martinique M.D.   On: 09/02/2015 07:33   Dg Abd Portable 1v  09/03/2015  CLINICAL DATA:  NG tube placement EXAM: PORTABLE ABDOMEN - 1 VIEW COMPARISON:  None. FINDINGS: Feeding tube with the tip projecting over the distal body of the stomach. There is no bowel dilatation to suggest obstruction. There is no evidence of pneumoperitoneum, portal venous gas or pneumatosis. There are no pathologic calcifications along the expected course of the ureters. There is a left ventricular assist device noted. The osseous structures are unremarkable. IMPRESSION: Feeding tube with the tip projecting over the distal body of the stomach. Electronically Signed   By: Kathreen Devoid   On: 09/03/2015 14:23   Dg Abd Portable 1v  09/02/2015  CLINICAL DATA:  Ileus. EXAM: PORTABLE ABDOMEN - 1 VIEW COMPARISON:  08/28/2015 FINDINGS: Left ventricular assist device is partially  visualized. There is increased gas in small and large bowel loops throughout the abdomen, however no bowel dilatation is identified. No gross intraperitoneal free air is identified on this limited supine examination. No acute osseous abnormality is seen. IMPRESSION: Increased bowel gas without evidence of obstruction. Electronically Signed   By: Logan Bores M.D.   On: 09/02/2015 08:28     Medications:     Scheduled Medications: . allopurinol  300 mg Oral Daily  . amiodarone  100 mg Oral Daily  . citalopram  20 mg Oral Daily  . docusate sodium  200 mg Oral Daily  . feeding supplement (PRO-STAT SUGAR FREE 64)  30 mL Per Tube TID  . ferrous sulfate  325 mg Oral BID WC  . guaiFENesin  600 mg Oral BID  . imipenem-cilastatin  500 mg Intravenous Q12H  . pantoprazole  40 mg  Oral Daily  . pregabalin  75 mg Oral BID  . rifampin  300 mg Oral Q8H  . sodium chloride flush  10-40 mL Intracatheter Q12H  . sodium chloride flush  3 mL Intravenous Q12H  . tamsulosin  0.4 mg Oral Daily  . vancomycin  125 mg Oral QID    Infusions: . dextrose 5 % and 0.9% NaCl 50 mL/hr at 09/03/15 1500  . feeding supplement (NEPRO CARB STEADY) 1,000 mL (09/04/15 0200)  . norepinephrine (LEVOPHED) Adult infusion Stopped (09/04/15 0400)  . vasopressin (PITRESSIN) infusion - *FOR SHOCK* 0.03 Units/min (09/04/15 0600)    PRN Medications: sodium chloride, bisacodyl **OR** bisacodyl, fentaNYL (SUBLIMAZE) injection, ondansetron, oxyCODONE, sodium chloride flush, sodium chloride flush, traMADol   Assessment:   1. LVAD Complication- Driveline abscess 2. MSSA bacteremia -> septic shock 3. Acute/Chronic systolic HF s/p VAD placement 9/16 4. C. Difficile colitis 5. 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 Microcytic anemia 8. Urinary retention 9. Hypokalelmia 10. HTN 11. Anemia 12. Vomiting.  13. Sever Malnutrition.   37. ARF  Plan/Discussion:    S/P I&D LVAD driveline abscess.  Exposed hardware. ID following. Continue po Vanc for C diff, imipenem, and Rifampin. All blood cultures NGTD. Underwent deeper debridement and wound vac replacement 5/11. Abd abscess with few staphylococcus aureus. No vegetations on TEE.  Maps much improved. Off norepi. Continue vasopressin. Renal function much worse 4.68>5.57. Nephrology following.   OR time pending.  Not ready for flap. WBC trending down 20>13.8 . On vanc, imipenum, and rifampin.   C. Diff- Still having loose stools. Continue oral vanc.   Overall he has received 4 UPRBCs. Hgb back down to 7.9. Give 1 unit PRBCs. FOBT negative. Off heparin. Use SCDS.   On Tube Feeds.  Prealbumin 8.8 . Nutrition following.   Consult PT/OT.   VAD parameters stable.   I reviewed the LVAD parameters from today, and compared the results to the patient's prior recorded data.  No programming changes were made.  The LVAD is functioning within specified parameters.  The patient performs LVAD self-test daily.  LVAD interrogation was negative for any significant power changes, alarms or PI events/speed drops.  LVAD equipment check completed and is in good working order.  Back-up equipment present.   LVAD education done on emergency procedures and precautions and reviewed exit site care.  Length of Stay: [place] NP-C  09/04/2015, 7:03 AM  VAD Team --- VAD ISSUES ONLY--- Pager [phone] (7am - 7am)  Advanced Heart Failure Team  Pager [phone] (M-F; 7a - 4p)  Please contact [facility] for night-coverage after hours (4p -7a ) and weekends on [email or web address]    Patient seen and examined with Darrick Grinder, NP. We discussed all aspects of the encounter. I agree with the assessment and plan as stated above.   Looks slightly better today but remains on pressors and anuric. Creatinine rising quickly. Discussed with Renal will likely need Trialysis cath placed tomorrow.   Sputum with ESBL. Now on imipenem for ESBL sputum and MSSA blood.   C.diff  improved. Now with ileus. On reglan.   On TPN. Will need muscle flab for wound when nutritional status is better.  VAD parameters ok. Off heparin due to bleeding. Needs to go back on soon.   The patient is critically ill with multiple organ systems failure and requires high complexity decision making for assessment and support, frequent evaluation and titration of therapies, application of advanced monitoring technologies  and extensive interpretation of multiple databases.   Critical Care Time devoted to patient care services described in this note is 35 Minutes.  [name], Daniel,MD 11:28 PM

## 2015-09-04 NOTE — Progress Notes (Signed)
CSW met with patient at bedside. Patient with flat affect mentioned getting sick this morning and little improvement in his overall condition. Patient states it is better than yesterday but still a slow process. Patient admits to feeling very weak particularly in his legs. Patient with assistance from RN staff transferred from bed to chair during [place] visit. When RN and CSW discussed patient's 88 month old puppy Duke a smile crossed his face. Patient then shared a video of the puppy with family and seemed to perk up a bit after getting settled in chair. Patient appears to have strong faith in God which he reflects on frequently and draws strength and positivity for his recovery. CSW will continue to follow for support and encouragement through recovery. Raquel Sarna, LCSW [phone]

## 2015-09-04 NOTE — Progress Notes (Addendum)
Pharmacy Antibiotic Note  [name] is a 45 y.o. male with HF s/p LVAD 9/16  transferred from OSH after contracting CDiff and developing MSSA abscess and drive line infection Pharmacy had been consulted for Nafcillin, Rifampin and vancomycin po dosing.  5/14 drop in BP started  on phenylephrine changed to NE and vasopressin 5/15  WBC 13> 16 started improving 12 with ABX but bumped to 23 5/15 > 13 5/17 with change in ABX S/P  N/V and aspiration over weekend - Cxr LLL possible pna Broaden ABX 5/15 Imipenem and IV vancomycin  Vanc random level drawn = 24 5/17 am - continue to hold recheck with AM labs redose PRN Now with ARF Cr 1.4> 5.6 ABX dosing adjusted S/P OR for debriedment 5/9 and 5/11 repeat at bedside 5/16 New culture data sent  - confirmed MSSA.   Sputum Cx with GNR  Rifmapin can decrease serum concentration of digoxin (level 0.7 4/17 upper limit so ok) and may decrease amiodarone activity.  Patient with Afib hx currently in SR - monitor  Plan: Nafcillin 2gm IV q4 - stop while on imipenem - Discussed with ID cover aspiration with imipenem dec 500mg  q12 Rifampin 300mg  po q8 Vancomycin 125mg  po q6 - continue - not absorbed when given po  Vancomycin IV - hold doses for now random level 24 with last dose 5/16 0200   Height: 5\' 7"  (170.2 cm) Weight: 197 lb 5 oz (89.5 kg) (LVAD controller in bed with patient- 1 pillow and sheet) IBW/kg (Calculated) : 66.1  Temp (24hrs), Avg:98.6 F (37 C), Min:98.3 F (36.8 C), Max:98.9 F (37.2 C)   Recent Labs Lab 09/01/15 2106 09/02/15 0215  09/02/15 1828 09/03/15 0431 09/03/15 1300 09/03/15 2055 09/04/15 0409 09/04/15 0411  WBC  --  19.4*  < > 20.8* 20.0* 18.3* 19.9*  --  13.8*  CREATININE 1.40* 2.01*  --   --  3.89* 4.68*  --   --  5.57*  VANCOTROUGH  --   --   --   --   --   --   --  24*  --   < > = values in this interval not displayed.  Estimated Creatinine Clearance: 17.5 mL/min (by C-G formula based on Cr of 5.57).     Allergies  Allergen Reactions  . Phytonadione Other (See Comments)    Patient has LVAD: please check with LVAD coordinator on call or LVAD MD on call before reversal of anticoagulation with vit k    Antimicrobials this admission: 5/8 nafcillin >> 5/15 5/8 vancomycin po >>  5/9 Rifampin>> 5/15 imipenem>> 5/15 Vanc IV >>  Dose adjustments this admission: Decrease imipenem 500mg  q12 Hold vancomycin recheck level with AML redose PRN  Microbiology results: 5/2 OSH MSSA 5/2 OSH Cdiff 5/9 BCx: ngtd 5/9 Wound Cx: MSSA 5/8 MRSA PCR: neg 5/515 sputum: Kleb S -ceftaz, imipenem, cipro   Bonnita Nasuti Pharm.D. CPP, BCPS Clinical Pharmacist [phone] 09/04/2015 7:31 AM      Addendum Sputum Cx Kleb Will continue imipenem - as kleb sensitive, will cover anaerobes given recent aspiration and will cover MSSA in wound - will adjust doses  if CVVHD started Continue rifampin dose ok for poor renal fx D/C IV vancomycin as no GPC growing Continue po vanc for C-Diff   Bonnita Nasuti Pharm.D. CPP, BCPS Clinical Pharmacist [phone] 09/04/2015 3:20 PM

## 2015-09-04 NOTE — Progress Notes (Signed)
Pt encouraged to get up OOB to chair today and attempt to ambulate; pt not wanting to get to chair at this time; pt resting; pt denies nausea now; new orders given for Reglan after vomiting episode; will cont. To monitor.  Ruben Reason

## 2015-09-04 NOTE — Progress Notes (Signed)
Called and spoke with PA on call for cardiology group and updated that MAPs are 95-100s. Okay to titrate down vasopressin per [facility] order. Will continue to closely monitor. Richarda Blade RN

## 2015-09-04 NOTE — Progress Notes (Signed)
CSW referred to provide supportive intervention to patient at bedside. Patient is well known to this CSW through the outpatient LVAD clinic. Patient's mother passed away a few weeks back in [facility] where patient was visiting for funeral when he became ill and hospitalized. Patient spoke about his mother and his time spent with her prior to her passing. Patient has supportive family although most live in [place] with one brother who is local in [place]. Patient's brother also at bedside at the time of visit. Patient spoke of his current LVAD complication and feelings surrounding his current situation. Patient appears very down, grieving the loss of his mother which has had an impact on his mood and usual positive outlook. Patient spoke about pray as a coping mechanism to assist with recovery. He is motivated to recover from this complication but will require additional supports throughout hospitalization. CSW will continue to follow for support and any other needs as they arise. Raquel Sarna, LCSW [phone]

## 2015-09-04 NOTE — Progress Notes (Signed)
1 Day Post-Op Procedure(s) (LRB): IRRIGATION AND DEBRIDEMENT ABDOMINAL WOUND WITH APPLICATION OF ACELL (N/A) Subjective: Patient with improved blood pressure, decreased pressor requirement Infected VAD pump pocket irrigated and changed yesterday in ICU with OR sterile technique. Wound with minimal granulation due to malnutrition but no gross infection A cell powder reapplied and wet-to-dry dressing placed Small skin bleeders treated with cautery-suture ligation Remains off heparin, less bleeding from wound but needs one unit packed cells today Acute renal failure persists-followed by renal  Objective: Vital signs in last 24 hours: Temp:  [97.6 F (36.4 C)-98.9 F (37.2 C)] 97.6 F (36.4 C) (05/17 0821) Pulse Rate:  [70-83] 81 (05/17 1030) Cardiac Rhythm:  [-] Normal sinus rhythm (05/17 0800) Resp:  [13-26] 18 (05/17 1030) SpO2:  [93 %-100 %] 95 % (05/17 1030) Arterial Line BP: (119-164)/(72-95) 124/74 mmHg (05/17 1030) Weight:  [197 lb 5 oz (89.5 kg)] 197 lb 5 oz (89.5 kg) (05/17 0615)  Hemodynamic parameters for last 24 hours: CVP:  [8 mmHg-25 mmHg] 16 mmHg  Intake/Output from previous day: 05/16 0701 - 05/17 0700 In: 2757.8 [I.V.:1726.1; Blood:335; NG/GT:496.7; IV [id number] Out: 192 [Urine:190; Stool:2] Intake/Output this shift: Total I/O In: 814.4 [P.O.:60; I.V.:177.4; Blood:330; NG/GT:147; IV Piggyback:100] Out: 265 [Urine:15; Emesis/NG output:250]  Abdomen mildly distended Patient vomited 1 time today Tube feed will be held Gastric distention on KUB-continue Reglan  Lab Results:  Recent Labs  09/03/15 2055 09/04/15 0411  WBC 19.9* 13.8*  HGB 8.6* 7.9*  HCT 24.5* 23.0*  PLT 213 212   BMET:  Recent Labs  09/03/15 1300 09/04/15 0411  NA 132* 134*  K 3.9 3.8  CL 102 104  CO2 18* 16*  GLUCOSE 111* 104*  BUN 20 27*  CREATININE 4.68* 5.57*  CALCIUM 7.3* 7.4*    PT/INR: No results for input(s): LABPROT, INR in the last 72 hours. ABG    Component  Value Date/Time   PHART 7.386 08/29/2015 0759   HCO3 25.2* 08/29/2015 0759   TCO2 22 09/01/2015 2106   ACIDBASEDEF 0.3 12/23/2014 0335   O2SAT 67.7 09/04/2015 0402   CBG (last 3)   Recent Labs  09/04/15 09/04/15 0354 09/04/15 0830  GLUCAP 114* 105* 112*    Assessment/Plan: S/P Procedure(s) (LRB): IRRIGATION AND DEBRIDEMENT ABDOMINAL WOUND WITH APPLICATION OF ACELL (N/A) Continue IV antibiotics for MSSA bacteremia, C. Difficile colitis, possible pneumonia from aspiration Nutrition with tube feeds currently on hold because of gastric ileus. Patient not a good candidate for TPN while in renal failure Mobilize out of bed as tolerated, physical therapy as tolerated   LOS: 9 days    Tharon Aquas Trigt III 09/04/2015

## 2015-09-04 NOTE — Progress Notes (Signed)
Admit: 08/26/2015 LOS: 9  [name] normal baseline SCr admitted with MSSA Driveline Infection and Abscess, C diff colitis, now with septic shock, and AKI  Subjective:  Little UOP in past 24h  Renal US w/o acute structural / obstructive issues CK negative Renal function further worsened Started enteral nutrition Has some appetite   05/16 0701 - 05/17 0700 In: 2757.8 [I.V.:1726.1; Blood:335; NG/GT:496.7; IV [id number] Out: 192 [Urine:190; Stool:2]  Filed Weights   09/02/15 0500 09/03/15 0630 09/04/15 0615  Weight: 85.6 kg (188 lb 11.4 oz) 88.451 kg (195 lb) 89.5 kg (197 lb 5 oz)    Scheduled Meds: . allopurinol  300 mg Oral Daily  . amiodarone  100 mg Oral Daily  . citalopram  20 mg Oral Daily  . docusate sodium  200 mg Oral Daily  . feeding supplement (PRO-STAT SUGAR FREE 64)  30 mL Per Tube TID  . ferrous sulfate  325 mg Oral BID WC  . guaiFENesin  600 mg Oral BID  . imipenem-cilastatin  500 mg Intravenous Q12H  . pantoprazole  40 mg Oral Daily  . pregabalin  75 mg Oral BID  . rifampin  300 mg Oral Q8H  . sodium chloride flush  10-40 mL Intracatheter Q12H  . sodium chloride flush  3 mL Intravenous Q12H  . tamsulosin  0.4 mg Oral Daily  . vancomycin  125 mg Oral QID   Continuous Infusions: . dextrose 5 % and 0.9% NaCl 50 mL/hr at 09/04/15 0801  . feeding supplement (NEPRO CARB STEADY) 1,000 mL (09/04/15 0800)  . norepinephrine (LEVOPHED) Adult infusion Stopped (09/04/15 0400)  . vasopressin (PITRESSIN) infusion - *FOR SHOCK* 0.03 Units/min (09/04/15 0801)   PRN Meds:.sodium chloride, bisacodyl **OR** bisacodyl, fentaNYL (SUBLIMAZE) injection, ondansetron, oxyCODONE, sodium chloride flush, sodium chloride flush, traMADol  Current Labs: reviewed   Physical Exam:  Blood pressure 108/74, pulse 81, temperature 97.6 F (36.4 C), temperature source Oral, resp. rate 21, height 5\' 7"  (1.702 m), weight 89.5 kg (197 lb 5 oz), SpO2 97 %. GEN: resting, opens eyes to voice and  converses ENT: NCAT EYES: EOMI, muddy sclera CV: Continuous home present, minimal audible S1 and S2 PULM: Clear bilaterally ABD: Soft, nontender SKIN: Clean bandages over xiphoid region EXT: Trace to 1+ edema Foley catheter present  Renal Studies 1. Urine Sediment 5/16: Numerous darkly pigmented granular and degenerating cellular casts, abundant scattered pigmented granular debris, numerous broad waxy cast, abundant transitional cells and squamous cells. Scattered are RTEs No erythrocytes with dysmorphic features. No WBC casts. Sediment consistent with ATN, features of CKD 2. Renal US 5/16L R 10.3cm, L 10.7cm, no obstruction 3. 5/16 CK: neg  Assessment  1. AKI, anuric with Urine sediment suggestive of ATN 1. ATN, rhabdo excluded. Doubt AIN but had significant PCN Exposure 2. Baseline SCr 1.1-1.4 3. Renal US 5/16 w/o structural / obstructive issues 4. No CRRT needs at current time K and HCO3 ok -- but very well will require in next 24h 5. Not responsive to lasix  6. Daily weights, Daily Renal Panel, Strict I/Os, Avoid nephrotoxins (NSAIDs, judicious IV Contrast)  2. MSSA Bacteremia and LVAD Driveline Infection: per TCTS, AHF, RCID; TEE w/o vegetation 3. Septic Shock on Imi, Rifampin, Vanc PO/IV 4. NICM, chronic sHF; LVAD present 5. Anemia in AKI 6. C Dificile colitis 7. AFib on ami  Plan 1. BID labs 2. Anticipate RRT in next 24h unless significant improvement 3. No RRT needs at current time 4. Cont supporitive measures  Pearson Grippe MD 09/04/2015, 8:25  AM   Recent Labs Lab 09/03/15 0431 09/03/15 1300 09/04/15 0411  NA 134* 132* 134*  K 4.0 3.9 3.8  CL 102 102 104  CO2 18* 18* 16*  GLUCOSE 107* 111* 104*  BUN 17 20 27*  CREATININE 3.89* 4.68* 5.57*  CALCIUM 7.5* 7.3* 7.4*  PHOS 6.7*  --   --     Recent Labs Lab 09/03/15 0431 09/03/15 1300 09/03/15 2055 09/04/15 0411  WBC 20.0* 18.3* 19.9* 13.8*  NEUTROABS 17.8*  --   --   --   HGB 8.5* 8.8* 8.6* 7.9*   HCT 23.8* 25.9* 24.5* 23.0*  MCV 69.0* 70.8* 69.2* 69.9*  PLT 286 266 213 212

## 2015-09-04 NOTE — Plan of Care (Signed)
Problem: Fluid Volume: Goal: Ability to maintain a balanced intake and output will improve Outcome: Not Progressing Creatinine elevated, poor urine output.   Problem: Nutrition: Goal: Adequate nutrition will be maintained Outcome: Progressing Tube feedings initiated 09/03/15

## 2015-09-05 ENCOUNTER — Inpatient Hospital Stay (HOSPITAL_COMMUNITY): Payer: 59 | Admitting: Anesthesiology

## 2015-09-05 ENCOUNTER — Inpatient Hospital Stay (HOSPITAL_COMMUNITY): Payer: 59

## 2015-09-05 ENCOUNTER — Encounter (HOSPITAL_COMMUNITY)
Admission: AD | Disposition: A | Discharge status: Rehabilitation Facility | Payer: Self-pay | Source: Ambulatory Visit | Attending: Internal Medicine

## 2015-09-05 ENCOUNTER — Encounter (HOSPITAL_COMMUNITY): Payer: Self-pay

## 2015-09-05 ENCOUNTER — Encounter (HOSPITAL_COMMUNITY): Payer: Self-pay | Admitting: Plastic Surgery

## 2015-09-05 DIAGNOSIS — N185 Chronic kidney disease, stage 5: Secondary | ICD-10-CM

## 2015-09-05 HISTORY — PX: INCISION AND DRAINAGE OF WOUND: SHX1803

## 2015-09-05 HISTORY — PX: INSERTION OF DIALYSIS CATHETER: SHX1324

## 2015-09-05 HISTORY — PX: APPLICATION OF A-CELL OF EXTREMITY: SHX6303

## 2015-09-05 HISTORY — PX: APPLICATION OF WOUND VAC: SHX5189

## 2015-09-05 LAB — POCT I-STAT, CHEM 8
BUN: 36 mg/dL — ABNORMAL HIGH (ref 6–20)
CALCIUM ION: 1.03 mmol/L — AB (ref 1.12–1.23)
CHLORIDE: 105 mmol/L (ref 101–111)
Creatinine, Ser: 7.7 mg/dL — ABNORMAL HIGH (ref 0.61–1.24)
GLUCOSE: 92 mg/dL (ref 65–99)
HCT: 27 % — ABNORMAL LOW (ref 39.0–52.0)
HEMOGLOBIN: 9.2 g/dL — AB (ref 13.0–17.0)
POTASSIUM: 3.6 mmol/L (ref 3.5–5.1)
SODIUM: 138 mmol/L (ref 135–145)
TCO2: 19 mmol/L (ref 0–100)

## 2015-09-05 LAB — COMPREHENSIVE METABOLIC PANEL
ALK PHOS: 96 U/L (ref 38–126)
ALT: 20 U/L (ref 17–63)
ANION GAP: 15 (ref 5–15)
AST: 41 U/L (ref 15–41)
Albumin: 1.7 g/dL — ABNORMAL LOW (ref 3.5–5.0)
BILIRUBIN TOTAL: 8.9 mg/dL — AB (ref 0.3–1.2)
BUN: 36 mg/dL — ABNORMAL HIGH (ref 6–20)
CALCIUM: 7.8 mg/dL — AB (ref 8.9–10.3)
CO2: 16 mmol/L — ABNORMAL LOW (ref 22–32)
CREATININE: 7.13 mg/dL — AB (ref 0.61–1.24)
Chloride: 103 mmol/L (ref 101–111)
GFR, EST AFRICAN AMERICAN: 9 mL/min — AB (ref >60)
GFR, EST NON AFRICAN AMERICAN: 8 mL/min — AB (ref >60)
Glucose, Bld: 99 mg/dL (ref 65–99)
Potassium: 3.7 mmol/L (ref 3.5–5.1)
Sodium: 134 mmol/L — ABNORMAL LOW (ref 135–145)
TOTAL PROTEIN: 5.9 g/dL — AB (ref 6.5–8.1)

## 2015-09-05 LAB — CBC
HCT: 23.1 % — ABNORMAL LOW (ref 39.0–52.0)
Hemoglobin: 8 g/dL — ABNORMAL LOW (ref 13.0–17.0)
MCH: 24.4 pg — ABNORMAL LOW (ref 26.0–34.0)
MCHC: 34.6 g/dL (ref 30.0–36.0)
MCV: 70.4 fL — ABNORMAL LOW (ref 78.0–100.0)
Platelets: 156 10*3/uL (ref 150–400)
RBC: 3.28 MIL/uL — ABNORMAL LOW (ref 4.22–5.81)
RDW: 26 % — ABNORMAL HIGH (ref 11.5–15.5)
WBC: 12.6 10*3/uL — ABNORMAL HIGH (ref 4.0–10.5)

## 2015-09-05 LAB — CARBOXYHEMOGLOBIN
CARBOXYHEMOGLOBIN: 2 % — AB (ref 0.5–1.5)
Methemoglobin: 1.2 % (ref 0.0–1.5)
O2 SAT: 58.1 %
Total hemoglobin: 8.4 g/dL — ABNORMAL LOW (ref 13.5–18.0)

## 2015-09-05 LAB — GLUCOSE, CAPILLARY
GLUCOSE-CAPILLARY: 101 mg/dL — AB (ref 65–99)
GLUCOSE-CAPILLARY: 85 mg/dL (ref 65–99)
GLUCOSE-CAPILLARY: 87 mg/dL (ref 65–99)
GLUCOSE-CAPILLARY: 87 mg/dL (ref 65–99)
GLUCOSE-CAPILLARY: 91 mg/dL (ref 65–99)
GLUCOSE-CAPILLARY: 94 mg/dL (ref 65–99)

## 2015-09-05 LAB — TYPE AND SCREEN
ABO/RH(D): O POS
Antibody Screen: NEGATIVE
Unit division: 0
Unit division: 0
Unit division: 0
Unit division: 0
Unit division: 0

## 2015-09-05 LAB — ALT: ALT: 18 U/L (ref 17–63)

## 2015-09-05 LAB — VANCOMYCIN, RANDOM: Vancomycin Rm: 20 ug/mL

## 2015-09-05 LAB — PREPARE RBC (CROSSMATCH)

## 2015-09-05 LAB — LACTATE DEHYDROGENASE: LDH: 244 U/L — ABNORMAL HIGH (ref 98–192)

## 2015-09-05 SURGERY — IRRIGATION AND DEBRIDEMENT WOUND
Anesthesia: General | Site: Chest | Laterality: Right

## 2015-09-05 MED ORDER — PHENYLEPHRINE HCL 10 MG/ML IJ SOLN
INTRAMUSCULAR | Status: DC | PRN
  Administered 2015-09-05: 40 ug via INTRAVENOUS

## 2015-09-05 MED ORDER — NEPRO/CARBSTEADY PO LIQD
237.0000 mL | Freq: Three times a day (TID) | ORAL | Status: DC
  Administered 2015-09-06 – 2015-09-08 (×8): 237 mL
  Filled 2015-09-05 (×22): qty 237

## 2015-09-05 MED ORDER — ALTEPLASE 2 MG IJ SOLR
2.0000 mg | Freq: Once | INTRAMUSCULAR | Status: AC | PRN
  Administered 2015-09-18: 2 mg

## 2015-09-05 MED ORDER — DEXTROSE 5 % IV SOLN
2.0000 g | INTRAVENOUS | Status: DC
  Administered 2015-09-05 – 2015-09-09 (×5): 2 g via INTRAVENOUS
  Filled 2015-09-05 (×5): qty 2

## 2015-09-05 MED ORDER — HEPARIN SODIUM (PORCINE) 1000 UNIT/ML DIALYSIS: 1000.0000 [IU] | INTRAMUSCULAR | Status: DC | PRN

## 2015-09-05 MED ORDER — MIDAZOLAM HCL 2 MG/2ML IJ SOLN
INTRAMUSCULAR | Status: AC
  Filled 2015-09-05: qty 2

## 2015-09-05 MED ORDER — SODIUM CHLORIDE 0.9 % IV SOLN
Freq: Once | INTRAVENOUS | Status: AC
  Administered 2015-09-05: 09:00:00 via INTRAVENOUS

## 2015-09-05 MED ORDER — SODIUM CHLORIDE 0.9 % IV SOLN: 100.0000 mL | INTRAVENOUS | Status: DC | PRN

## 2015-09-05 MED ORDER — SODIUM BICARBONATE 8.4 % IV SOLN
50.0000 meq | Freq: Once | INTRAVENOUS | Status: AC
  Administered 2015-09-05: 50 meq via INTRAVENOUS
  Filled 2015-09-05: qty 50

## 2015-09-05 MED ORDER — HEPARIN SODIUM (PORCINE) 1000 UNIT/ML IJ SOLN
INTRAMUSCULAR | Status: AC
  Filled 2015-09-05: qty 1

## 2015-09-05 MED ORDER — METOCLOPRAMIDE HCL 5 MG/ML IJ SOLN
5.0000 mg | Freq: Four times a day (QID) | INTRAMUSCULAR | Status: DC
  Administered 2015-09-05 – 2015-09-23 (×50): 5 mg via INTRAVENOUS
  Filled 2015-09-05 (×51): qty 2

## 2015-09-05 MED ORDER — FENTANYL CITRATE (PF) 250 MCG/5ML IJ SOLN
INTRAMUSCULAR | Status: AC
  Filled 2015-09-05: qty 5

## 2015-09-05 MED ORDER — ONDANSETRON HCL 4 MG/2ML IJ SOLN
INTRAMUSCULAR | Status: DC | PRN
  Administered 2015-09-05: 4 mg via INTRAVENOUS

## 2015-09-05 MED ORDER — SODIUM CHLORIDE 0.9 % IR SOLN
Status: DC | PRN
  Administered 2015-09-05: 1000 mL

## 2015-09-05 MED ORDER — LIDOCAINE HCL (CARDIAC) 20 MG/ML IV SOLN
INTRAVENOUS | Status: DC | PRN
  Administered 2015-09-05: 60 mg via INTRAVENOUS

## 2015-09-05 MED ORDER — HEPARIN SODIUM (PORCINE) 10000 UNIT/ML IJ SOLN
INTRAMUSCULAR | Status: DC | PRN
  Administered 2015-09-05: 10000 [IU] via INTRAVENOUS

## 2015-09-05 MED ORDER — FENTANYL CITRATE (PF) 250 MCG/5ML IJ SOLN
INTRAMUSCULAR | Status: DC | PRN
  Administered 2015-09-05: 50 ug via INTRAVENOUS

## 2015-09-05 MED ORDER — ETOMIDATE 2 MG/ML IV SOLN
INTRAVENOUS | Status: DC | PRN
  Administered 2015-09-05: 16 mg via INTRAVENOUS

## 2015-09-05 MED ORDER — SODIUM CHLORIDE 0.9 % IV SOLN
INTRAVENOUS | Status: DC | PRN
  Administered 2015-09-05: 14:00:00

## 2015-09-05 MED ORDER — PHENYLEPHRINE 40 MCG/ML (10ML) SYRINGE FOR IV PUSH (FOR BLOOD PRESSURE SUPPORT)
PREFILLED_SYRINGE | INTRAVENOUS | Status: AC
  Filled 2015-09-05: qty 30

## 2015-09-05 MED ORDER — HEPARIN SOD (PORK) LOCK FLUSH 100 UNIT/ML IV SOLN
INTRAVENOUS | Status: AC
Start: 2015-09-05 — End: 2015-09-05
  Filled 2015-09-05: qty 5

## 2015-09-05 MED ORDER — POLYMYXIN B SULFATE 500000 UNITS IJ SOLR
INTRAMUSCULAR | Status: DC | PRN
  Administered 2015-09-05: 500 mL

## 2015-09-05 MED ORDER — SODIUM CHLORIDE 0.9 % IV SOLN
100.0000 mL | INTRAVENOUS | Status: DC | PRN
  Administered 2015-09-17 (×2): via INTRAVENOUS

## 2015-09-05 MED ORDER — FUROSEMIDE 10 MG/ML IJ SOLN
80.0000 mg | Freq: Once | INTRAMUSCULAR | Status: AC
  Administered 2015-09-05: 80 mg via INTRAVENOUS
  Filled 2015-09-05: qty 8

## 2015-09-05 SURGICAL SUPPLY — 63 items
BAG DECANTER FOR FLEXI CONT (MISCELLANEOUS) ×6 IMPLANT
BENZOIN TINCTURE PRP APPL 2/3 (GAUZE/BANDAGES/DRESSINGS) ×3 IMPLANT
BIOPATCH BLUE 3/4IN DISK W/1.5 (GAUZE/BANDAGES/DRESSINGS) ×3 IMPLANT
BLADE SURG 15 STRL LF DISP TIS (BLADE) ×2 IMPLANT
BLADE SURG 15 STRL SS (BLADE) ×1
CANISTER SUCTION 2500CC (MISCELLANEOUS) ×3 IMPLANT
CANISTER WOUND CARE 500ML ATS (WOUND CARE) IMPLANT
CATH TRIALYSIS 20CM 13F 3LUM (IV SETS) ×3 IMPLANT
CONT SPEC STER OR (MISCELLANEOUS) IMPLANT
COVER SURGICAL LIGHT HANDLE (MISCELLANEOUS) ×3 IMPLANT
DRAPE C-ARM 42X72 X-RAY (DRAPES) ×3 IMPLANT
DRAPE CHEST BREAST 15X10 FENES (DRAPES) ×3 IMPLANT
DRAPE IMP U-DRAPE 54X76 (DRAPES) IMPLANT
DRAPE INCISE IOBAN 66X45 STRL (DRAPES) IMPLANT
DRAPE LAPAROSCOPIC ABDOMINAL (DRAPES) ×3 IMPLANT
DRAPE PED LAPAROTOMY (DRAPES) IMPLANT
DRAPE PROXIMA HALF (DRAPES) IMPLANT
DRESSING HYDROCOLLOID 4X4 (GAUZE/BANDAGES/DRESSINGS) IMPLANT
DRSG ADAPTIC 3X8 NADH LF (GAUZE/BANDAGES/DRESSINGS) IMPLANT
DRSG PAD ABDOMINAL 8X10 ST (GAUZE/BANDAGES/DRESSINGS) IMPLANT
DRSG SORBAVIEW 3.5X5-5/16 MED (GAUZE/BANDAGES/DRESSINGS) ×3 IMPLANT
DRSG VAC ATS LRG SENSATRAC (GAUZE/BANDAGES/DRESSINGS) IMPLANT
DRSG VAC ATS MED SENSATRAC (GAUZE/BANDAGES/DRESSINGS) IMPLANT
DRSG VAC ATS SM SENSATRAC (GAUZE/BANDAGES/DRESSINGS) ×3 IMPLANT
ELECT CAUTERY BLADE 6.4 (BLADE) ×3 IMPLANT
ELECT REM PT RETURN 9FT ADLT (ELECTROSURGICAL) ×3
ELECTRODE REM PT RTRN 9FT ADLT (ELECTROSURGICAL) ×2 IMPLANT
GAUZE SPONGE 4X4 12PLY STRL (GAUZE/BANDAGES/DRESSINGS) IMPLANT
GEL ULTRASOUND 20GR AQUASONIC (MISCELLANEOUS) IMPLANT
GLOVE BIO SURGEON STRL SZ 6.5 (GLOVE) ×15 IMPLANT
GLOVE BIOGEL PI IND STRL 7.0 (GLOVE) ×4 IMPLANT
GLOVE BIOGEL PI INDICATOR 7.0 (GLOVE) ×2
GLOVE EUDERMIC 7 POWDERFREE (GLOVE) ×6 IMPLANT
GLOVE SURG SS PI 7.0 STRL IVOR (GLOVE) ×3 IMPLANT
GOWN STRL REUS W/ TWL LRG LVL3 (GOWN DISPOSABLE) ×8 IMPLANT
GOWN STRL REUS W/TWL LRG LVL3 (GOWN DISPOSABLE) ×4
KIT BASIN OR (CUSTOM PROCEDURE TRAY) ×3 IMPLANT
KIT ROOM TURNOVER OR (KITS) ×3 IMPLANT
MICROMATRIX 1000MG (Tissue) ×3 IMPLANT
NS IRRIG 1000ML POUR BTL (IV SOLUTION) ×3 IMPLANT
PACK GENERAL/GYN (CUSTOM PROCEDURE TRAY) ×3 IMPLANT
PACK ORTHO EXTREMITY (CUSTOM PROCEDURE TRAY) IMPLANT
PACK UNIVERSAL I (CUSTOM PROCEDURE TRAY) IMPLANT
PAD ABD 8X10 STRL (GAUZE/BANDAGES/DRESSINGS) ×3 IMPLANT
PAD ARMBOARD 7.5X6 YLW CONV (MISCELLANEOUS) ×3 IMPLANT
SOLUTION PARTIC MCRMTRX 1000MG (Tissue) ×2 IMPLANT
STAPLER VISISTAT 35W (STAPLE) IMPLANT
STOCKINETTE IMPERVIOUS 9X36 MD (GAUZE/BANDAGES/DRESSINGS) IMPLANT
STOCKINETTE IMPERVIOUS LG (DRAPES) IMPLANT
SURGILUBE 2OZ TUBE FLIPTOP (MISCELLANEOUS) ×3 IMPLANT
SUT MON AB 2-0 CT1 36 (SUTURE) ×3 IMPLANT
SUT SILK 2 0 SH CR/8 (SUTURE) ×3 IMPLANT
SUT SILK 4 0 P 3 (SUTURE) IMPLANT
SUT VIC AB 5-0 PS2 18 (SUTURE) IMPLANT
SWAB COLLECTION DEVICE MRSA (MISCELLANEOUS) IMPLANT
SYR 20ML ECCENTRIC (SYRINGE) ×3 IMPLANT
SYR 5ML LL (SYRINGE) ×6 IMPLANT
TOWEL OR 17X24 6PK STRL BLUE (TOWEL DISPOSABLE) ×3 IMPLANT
TOWEL OR 17X26 10 PK STRL BLUE (TOWEL DISPOSABLE) ×3 IMPLANT
TUBE ANAEROBIC SPECIMEN COL (MISCELLANEOUS) IMPLANT
TUBE CONNECTING 12X1/4 (SUCTIONS) IMPLANT
UNDERPAD 30X30 INCONTINENT (UNDERPADS AND DIAPERS) IMPLANT
YANKAUER SUCT BULB TIP NO VENT (SUCTIONS) IMPLANT

## 2015-09-05 NOTE — H&P (View-Only) (Signed)
Reason for Consult: Sternal / chest wound Referring Physician: Dr. Dahlia Byes  [name] is an 45 y.o. male.  HPI: The patient is a 45 yrs old bm here for treatment of a sternal / chest wound.  The patient is currently in the Unit and recovering from surgery earlier this week.  He had an LVAD placed.  He had a trip to [place] and came back with concerns of infection at the site of the pump.  He was taken to the OR for debridement.  There was purulence at the site.  Irrigation and debridement was done.  Acell and the VAC was placed.  He is recovering now.  Attempts are being made to see if he is a candidate for transplant.  Will need coverage over the pump.  Pictures seen and significant amount exposed.  Past Medical History  Diagnosis Date  . Chronic systolic heart failure (HCC)     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%)  . Atrial fibrillation -parosysmal      Rx w amiodarone  . Noncompliance     H/O  MEDICAL NONCOMPLIANCE  . 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 Tricuspid valve regurgitation     SEVERE  . Severe mitral regurgitation   . Polymorphic ventricular tachycardia (HCC)     RECURRENT WITH APPROPRIATE SHOCK THERAPY IN THE PAST  . Ventricular fibrillation (HCC)     WITH APPROPRIATE SHOCK THERAPY IN THE PAST  . Hypertension   . Gout   . RA (rheumatoid arthritis) ([place])   . Automatic implantable cardiac defibrillator -BSX     single chamber  . GI bleed -massive     11 Units 2012  . Elevated LFTs   . H/O hyperthyroidism   . CHF (congestive heart failure) ([place])   . AKI (acute kidney injury) ([place])   . AICD (automatic cardioverter/defibrillator) present     Past Surgical History  Procedure Laterality Date  . Cardiac catheterization  06/2006    RIGHT HEART CATH SHOWING SEVERE BIVENTRICUALR CHF WITH MARKED FILLING AND PRESSURES  . Insert / replace / remove pacemaker      GUIDANT HE ICD MODEL 2180, SERIAL # [id number]  .  Cholecystectomy    . Implantable cardioverter defibrillator generator change N/A 07/01/2011    Procedure: IMPLANTABLE CARDIOVERTER DEFIBRILLATOR GENERATOR CHANGE;  Surgeon: Deboraha Sprang, MD;  Location: [facility] CATH LAB;  Service: Cardiovascular;  Laterality: N/A;  . Tee without cardioversion N/A 12/12/2014    Procedure: TRANSESOPHAGEAL ECHOCARDIOGRAM (TEE);  Surgeon: Jerline Pain, MD;  Location: [facility];  Service: Cardiovascular;  Laterality: N/A;  . Cardiac catheterization N/A 12/14/2014    Procedure: Right Heart Cath;  Surgeon: Jolaine Artist, MD;  Location: [facility] CV LAB;  Service: Cardiovascular;  Laterality: N/A;  . Cardiac catheterization N/A 12/14/2014    Procedure: IABP Insertion;  Surgeon: Jolaine Artist, MD;  Location: [place] CV LAB;  Service: Cardiovascular;  Laterality: N/A;  . Insertion of implantable left ventricular assist device N/A 12/20/2014    Procedure: INSERTION OF IMPLANTABLE LEFT VENTRICULAR ASSIST DEVICE;  Surgeon: Ivin Poot, MD;  Location: [facility];  Service: Open Heart Surgery;  Laterality: N/A;  CIRC ARREST  NITRIC OXIDE  . Tee without cardioversion N/A 12/20/2014    Procedure: TRANSESOPHAGEAL ECHOCARDIOGRAM (TEE);  Surgeon: Ivin Poot, MD;  Location: [facility];  Service: Open Heart Surgery;  Laterality: N/A;  . Sternal wound debridement N/A 08/27/2015  Procedure: WOUND IRRIGATION AND DEBRIDEMENT;  Surgeon: Ivin Poot, MD;  Location: [facility];  Service: Thoracic;  Laterality: N/A;  . Application of wound vac N/A 08/27/2015    Procedure: APPLICATION OF WOUND VAC;  Surgeon: Ivin Poot, MD;  Location: [facility] OR;  Service: Thoracic;  Laterality: N/A;    Family History  Problem Relation Age of Onset  . Heart failure Brother     Social History:  reports that he has never smoked. He has never used smokeless tobacco. He reports that he does not drink alcohol or use illicit drugs.  Allergies:  Allergies  Allergen Reactions  . Phytonadione Other (See  Comments)    Patient has LVAD: please check with LVAD coordinator on call or LVAD MD on call before reversal of anticoagulation with vit k    Medications: I have reviewed the patient's current medications.  Results for orders placed or performed during the hospital encounter of 08/26/15 (from the past 48 hour(s))  Anaerobic culture     Status: None (Preliminary result)   Collection Time: 08/27/15  5:48 PM  Result Value Ref Range   Specimen Description WOUND CHEST    Special Requests NONE    Gram Stain      NO WBC SEEN NO SQUAMOUS EPITHELIAL CELLS SEEN NO ORGANISMS SEEN Performed at [facility]    Culture      NO ANAEROBES ISOLATED; CULTURE IN PROGRESS FOR 5 DAYS Performed at [facility]    Report Status PENDING   Wound culture     Status: None (Preliminary result)   Collection Time: 08/27/15  5:48 PM  Result Value Ref Range   Specimen Description WOUND CHEST    Special Requests NONE    Gram Stain      NO WBC SEEN NO SQUAMOUS EPITHELIAL CELLS SEEN NO ORGANISMS SEEN Performed at [facility]    Culture      NO GROWTH 1 DAY Performed at [facility]    Report Status PENDING   Prepare fresh frozen plasma     Status: None   Collection Time: 08/27/15  6:00 PM  Result Value Ref Range   Unit Number [id number]    Blood Component Type THWPLS APHR2    Unit division 00    Status of Unit ISSUED,FINAL    Transfusion Status OK TO TRANSFUSE    Unit Number [id number]    Blood Component Type THAWED PLASMA    Unit division 00    Status of Unit ISSUED,FINAL    Transfusion Status OK TO TRANSFUSE   Lactate dehydrogenase     Status: None   Collection Time: 08/28/15  4:50 AM  Result Value Ref Range   LDH 188 98 - 192 U/L  Protime-INR     Status: Abnormal   Collection Time: 08/28/15  4:50 AM  Result Value Ref Range   Prothrombin Time 18.5 (H) 11.6 - 15.2 seconds   INR 1.54 (H) 0.00 - 7.61  Basic metabolic panel     Status: Abnormal    Collection Time: 08/28/15  4:50 AM  Result Value Ref Range   Sodium 141 135 - 145 mmol/L   Potassium 3.4 (L) 3.5 - 5.1 mmol/L   Chloride 107 101 - 111 mmol/L   CO2 23 22 - 32 mmol/L   Glucose, Bld 83 65 - 99 mg/dL   BUN 14 6 - 20 mg/dL   Creatinine, Ser 1.16 0.61 - 1.24 mg/dL   Calcium 8.1 (L) 8.9 -  10.3 mg/dL   GFR calc non Af Amer >60 >60 mL/min   GFR calc Af Amer >60 >60 mL/min    Comment: (NOTE) The eGFR has been calculated using the CKD EPI equation. This calculation has not been validated in all clinical situations. eGFR's persistently <60 mL/min signify possible Chronic Kidney Disease.    Anion gap 11 5 - 15  CBC     Status: Abnormal   Collection Time: 08/28/15  4:50 AM  Result Value Ref Range   WBC 8.1 4.0 - 10.5 K/uL   RBC 3.73 (L) 4.22 - 5.81 MIL/uL   Hemoglobin 8.0 (L) 13.0 - 17.0 g/dL   HCT 23.9 (L) 39.0 - 52.0 %   MCV 64.1 (L) 78.0 - 100.0 fL   MCH 21.4 (L) 26.0 - 34.0 pg   MCHC 33.5 30.0 - 36.0 g/dL   RDW 21.3 (H) 11.5 - 15.5 %   Platelets 264 150 - 400 K/uL    Comment: SPECIMEN CHECKED FOR CLOTS REPEATED TO VERIFY   Prepare RBC     Status: None   Collection Time: 08/28/15  7:45 AM  Result Value Ref Range   Order Confirmation      ORDER PROCESSED BY BLOOD BANK BB SAMPLE OR UNITS ALREADY AVAILABLE  Prepare RBC     Status: None   Collection Time: 08/28/15  9:00 AM  Result Value Ref Range   Order Confirmation ORDER PROCESSED BY BLOOD BANK   Heparin level (unfractionated)     Status: Abnormal   Collection Time: 08/28/15  3:00 PM  Result Value Ref Range   Heparin Unfractionated <0.10 (L) 0.30 - 0.70 IU/mL    Comment:        IF HEPARIN RESULTS ARE BELOW EXPECTED VALUES, AND PATIENT DOSAGE HAS BEEN CONFIRMED, SUGGEST FOLLOW UP TESTING OF ANTITHROMBIN III LEVELS.   CBC     Status: Abnormal   Collection Time: 08/28/15  4:00 PM  Result Value Ref Range   WBC 11.2 (H) 4.0 - 10.5 K/uL   RBC 4.37 4.22 - 5.81 MIL/uL   Hemoglobin 9.5 (L) 13.0 - 17.0 g/dL    HCT 28.0 (L) 39.0 - 52.0 %   MCV 64.1 (L) 78.0 - 100.0 fL   MCH 21.7 (L) 26.0 - 34.0 pg   MCHC 33.9 30.0 - 36.0 g/dL   RDW 23.0 (H) 11.5 - 15.5 %   Platelets 295 150 - 400 K/uL  Prepare fresh frozen plasma     Status: None (Preliminary result)   Collection Time: 08/28/15  8:01 PM  Result Value Ref Range   Unit Number [id number]    Blood Component Type THAWED PLASMA    Unit division 00    Status of Unit ISSUED,FINAL    Transfusion Status OK TO TRANSFUSE    Unit Number [id number]    Blood Component Type THW PLS APHR    Unit division 00    Status of Unit ISSUED    Transfusion Status OK TO TRANSFUSE   Heparin level (unfractionated)     Status: Abnormal   Collection Time: 08/29/15 12:16 AM  Result Value Ref Range   Heparin Unfractionated 0.18 (L) 0.30 - 0.70 IU/mL    Comment:        IF HEPARIN RESULTS ARE BELOW EXPECTED VALUES, AND PATIENT DOSAGE HAS BEEN CONFIRMED, SUGGEST FOLLOW UP TESTING OF ANTITHROMBIN III LEVELS.   Lactate dehydrogenase     Status: Abnormal   Collection Time: 08/29/15  5:08 AM  Result Value Ref Range  LDH 255 (H) 98 - 192 U/L    Comment: SLIGHT HEMOLYSIS  Basic metabolic panel     Status: Abnormal   Collection Time: 08/29/15  5:08 AM  Result Value Ref Range   Sodium 139 135 - 145 mmol/L   Potassium 3.3 (L) 3.5 - 5.1 mmol/L   Chloride 104 101 - 111 mmol/L   CO2 23 22 - 32 mmol/L   Glucose, Bld 92 65 - 99 mg/dL   BUN 10 6 - 20 mg/dL   Creatinine, Ser 1.40 (H) 0.61 - 1.24 mg/dL   Calcium 8.5 (L) 8.9 - 10.3 mg/dL   GFR calc non Af Amer 58 (L) >60 mL/min   GFR calc Af Amer >60 >60 mL/min    Comment: (NOTE) The eGFR has been calculated using the CKD EPI equation. This calculation has not been validated in all clinical situations. eGFR's persistently <60 mL/min signify possible Chronic Kidney Disease.    Anion gap 12 5 - 15  CBC     Status: Abnormal   Collection Time: 08/29/15  5:08 AM  Result Value Ref Range   WBC 11.3 (H) 4.0 - 10.5 K/uL     RBC 4.76 4.22 - 5.81 MIL/uL   Hemoglobin 10.3 (L) 13.0 - 17.0 g/dL    Comment: REPEATED TO VERIFY   HCT 30.0 (L) 39.0 - 52.0 %   MCV 63.0 (L) 78.0 - 100.0 fL   MCH 21.6 (L) 26.0 - 34.0 pg   MCHC 34.3 30.0 - 36.0 g/dL   RDW 23.3 (H) 11.5 - 15.5 %   Platelets 347 150 - 400 K/uL    Comment: REPEATED TO VERIFY  Heparin level (unfractionated)     Status: Abnormal   Collection Time: 08/29/15  6:04 AM  Result Value Ref Range   Heparin Unfractionated 0.26 (L) 0.30 - 0.70 IU/mL    Comment:        IF HEPARIN RESULTS ARE BELOW EXPECTED VALUES, AND PATIENT DOSAGE HAS BEEN CONFIRMED, SUGGEST FOLLOW UP TESTING OF ANTITHROMBIN III LEVELS.   Protime-INR     Status: Abnormal   Collection Time: 08/29/15  6:04 AM  Result Value Ref Range   Prothrombin Time 16.9 (H) 11.6 - 15.2 seconds   INR 1.37 0.00 - 1.49  I-STAT, chem 8     Status: Abnormal   Collection Time: 08/29/15  7:56 AM  Result Value Ref Range   Sodium 142 135 - 145 mmol/L   Potassium 3.1 (L) 3.5 - 5.1 mmol/L   Chloride 104 101 - 111 mmol/L   BUN 10 6 - 20 mg/dL   Creatinine, Ser 1.00 0.61 - 1.24 mg/dL   Glucose, Bld 88 65 - 99 mg/dL   Calcium, Ion 1.14 1.12 - 1.23 mmol/L   TCO2 28 0 - 100 mmol/L   Hemoglobin 11.6 (L) 13.0 - 17.0 g/dL   HCT 34.0 (L) 39.0 - 52.0 %  I-STAT 3, arterial blood gas (G3+)     Status: Abnormal   Collection Time: 08/29/15  7:59 AM  Result Value Ref Range   pH, Arterial 7.386 7.350 - 7.450   pCO2 arterial 42.1 35.0 - 45.0 mmHg   pO2, Arterial 459.0 (H) 80.0 - 100.0 mmHg   Bicarbonate 25.2 (H) 20.0 - 24.0 mEq/L   TCO2 26 0 - 100 mmol/L   O2 Saturation 100.0 %   Patient temperature HIDE    Sample type ARTERIAL   APTT     Status: Abnormal   Collection Time: 08/29/15 11:05 AM  Result Value Ref Range   aPTT 47 (H) 24 - 37 seconds    Comment:        IF BASELINE aPTT IS ELEVATED, SUGGEST PATIENT RISK ASSESSMENT BE USED TO DETERMINE APPROPRIATE ANTICOAGULANT THERAPY.     Dg Chest Port 1  View  08/28/2015  CLINICAL DATA:  Presence of left ventricular assist device. History of debridement of chest wound. EXAM: PORTABLE CHEST 1 VIEW COMPARISON:  08/27/2015 FINDINGS: Again noted is the LVAD and left cardiac ICD. Heart size is enlarged but stable. Median sternotomy wires are noted. There is a right arm PICC line with the tip in the region of the superior cavoatrial junction. Mild tenting along the left cardiac border probably represents scarring based on the stability. Otherwise, the lungs are clear without pulmonary edema. Negative for a pneumothorax. IMPRESSION: Stable support apparatuses. No acute chest findings. Electronically Signed   By: Markus Daft M.D.   On: 08/28/2015 07:56   Dg Abd Portable 1v  08/28/2015  CLINICAL DATA:  Colitis EXAM: PORTABLE ABDOMEN - 1 VIEW COMPARISON:  12/28/2014 FINDINGS: LVAD device is stable. AICD device is stable. Percutaneous pacemaker wires have been removed. Dilated bowel loops have markedly improved. No obvious free intraperitoneal gas. IMPRESSION: Resolved ileus. Electronically Signed   By: Marybelle Killings M.D.   On: 08/28/2015 07:56    Review of Systems  Constitutional: Positive for malaise/fatigue.  HENT: Negative.   Eyes: Negative.   Cardiovascular: Negative.   Genitourinary: Negative.   Skin: Negative.   Psychiatric/Behavioral: Negative.    Blood pressure 107/73, pulse 77, temperature 97.8 F (36.6 C), temperature source Oral, resp. rate 20, height 5' 7" (1.702 m), weight 83 kg (182 lb 15.7 oz), SpO2 99 %. Physical Exam  Constitutional: He appears well-developed.  HENT:  Head: Normocephalic and atraumatic.  Eyes: Conjunctivae are normal. Pupils are equal, round, and reactive to light.  Respiratory: Effort normal. No respiratory distress.    GI: Soft. He exhibits no distension.  Neurological: He is alert.  Skin: Skin is warm.  Psychiatric: He has a normal mood and affect.    Assessment/Plan: Will plan to take the patient to the OR  for Debridement of the sternal / chest wound with Acell and VAC placement.  Will plan to take him to the OR the follow week and likely do a muscle flap for definitive coverage.  Wallace Going 08/29/2015, 4:19 PM

## 2015-09-05 NOTE — Transfer of Care (Signed)
Immediate Anesthesia Transfer of Care Note  Patient: [name]  Procedure(s) Performed: Procedure(s): IRRIGATION AND DEBRIDEMENT sternal WOUND (N/A) APPLICATION OF A-CELL OF sternum (N/A) APPLICATION OF WOUND VAC to sternum (N/A) INSERTION OF DIALYSIS CATHETER;RIGHT SUBCLAVIAN (Right)  Patient Location: SICU  Anesthesia Type:General  Level of Consciousness: awake  Airway & Oxygen Therapy: Patient Spontanous Breathing and Patient connected to face mask oxygen  Post-op Assessment: Report given to RN and Post -op Vital signs reviewed and stable  Post vital signs: Reviewed and stable  Last Vitals:  Filed Vitals:   09/05/15 1130 09/05/15 1200  BP:    Pulse: 81 80  Temp:    Resp: 16 21    Last Pain:  Filed Vitals:   09/05/15 1204  PainSc: 0-No pain      Patients Stated Pain Goal: 0 ([id number])  Complications: No apparent anesthesia complications

## 2015-09-05 NOTE — Progress Notes (Signed)
Spoke with Dr. Haroldine Laws about titrating Vasopressin d/t MAPs being around 100. Order given to d/c vasopressin. Will continue to monitor.

## 2015-09-05 NOTE — Op Note (Signed)
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 Insertion of right subclavian temporary dialysis catheter  Anesthesia:  General Endotracheal   Clinical History/Surgical Indication:  The patient is s/p HM II LVAD on 12/20/2014. He recently presented with subxyphoid swelling with fever and chills and was found to have an abscess around the outflow aspect of the pump growing MSSA. This was debrided by Dr. Darcey Nora. The patient developed sepsis and acute renal failure. He was taken to the OR today for a dressing change by plastic and reconstructive surgery and it was felt that he would need a HD catheter placed at the same time.   Preparation:  The patient was taken directly to the operating room.  The consent was signed by Dr. Darcey Nora. Preoperative antibiotics were given. He was positioned supine on the operating room table. After being placed under general endotracheal anesthesia by the anesthesia team the neck and chest were prepped with betadine soap and solution and draped in the usual sterile manner. A surgical time-out was taken and the correct patient and operative procedure were confirmed with the nursing and anesthesia staff.   Insertion of dialysis catheter:  The right subclavian vein was accessed using a needle and guidwire under fluroscopic guidance. The dilators were passed and then a Trialysis catheter was placed over the guidwire and its position in the distal SVC confirmed with fluroscopy. The catheter had easy blood return and was flushed with heparin saline followed by a final flush of heparin at 1000 units per cc.  The catheter was sutured to the skin and a biopatch placed around the catheter followed by a sterile dressing. The sponge,  needle and instrument counts were correct according to the nurse.   The the wound dressing change was performed by [facility] and the patient was then transported to the surgical intensive care unit in critical but stable condition.

## 2015-09-05 NOTE — Progress Notes (Signed)
Physician Statement forms received from [employer] on behalf of patient's disability claim. Copy of forms scanned into patient's electronic medical record and original copy forward to [employer] for completion. [employer] flyer faxed back to [employer] with contact info for any future forms/claims/med rec requests to be sent directly to [employer] office.  [name]

## 2015-09-05 NOTE — Care Management Note (Signed)
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  Plan is for pt to return to OR 09/02/15.  CM will continue to monitor for disposition needs   Expected Discharge Date:                  Expected Discharge Plan:  [place]  In-House Referral:     Discharge planning Services  CM Consult  Post Acute Care Choice:    Choice offered to:     DME Arranged:    DME Agency:     HH Arranged:    HH Agency:     Status of Service:  In process, will continue to follow  Medicare Important Message Given:    Date Medicare IM Given:    Medicare IM give by:    Date Additional Medicare IM Given:    Additional Medicare Important Message give by:     If discussed at [place] of Stay Meetings, dates discussed:    Additional Comments: 09/05/2015 Pt remains on pressor.  Pt taken back to OR today for wound debridement.   Maryclare Labrador, RN 09/05/2015, 11:49 AM

## 2015-09-05 NOTE — Progress Notes (Signed)
Admit: 08/26/2015 LOS: 10  55M normal baseline SCr admitted with MSSA Driveline Infection and Abscess, C diff colitis, now with septic shock, and AKI  Subjective:  [place] UOP SCr further worsened To OR today for debriedment  05/17 0701 - 05/18 0700 In: 2645.3 [P.O.:180; I.V.:1411.1; Blood:330; NG/GT:524.3; IV Piggyback:200] Out: 370 [Urine:120; Emesis/NG output:250]  Filed Weights   09/03/15 0630 09/04/15 0615 09/05/15 0500  Weight: 88.451 kg (195 lb) 89.5 kg (197 lb 5 oz) 94.1 kg (207 lb 7.3 oz)    Scheduled Meds: . allopurinol  300 mg Oral Daily  . amiodarone  100 mg Oral Daily  .  ceFAZolin (ANCEF) IV  2 g Intravenous To SS-Surg  . citalopram  20 mg Oral Daily  . docusate sodium  200 mg Oral Daily  . feeding supplement (PRO-STAT SUGAR FREE 64)  30 mL Per Tube TID  . ferrous sulfate  325 mg Oral BID WC  . guaiFENesin  600 mg Oral BID  . imipenem-cilastatin  500 mg Intravenous Q12H  . metoCLOPramide (REGLAN) injection  5 mg Intravenous Q6H  . pantoprazole  40 mg Oral Daily  . pregabalin  75 mg Oral BID  . rifampin  300 mg Oral Q8H  . simethicone  80 mg Oral QID  . sodium chloride flush  10-40 mL Intracatheter Q12H  . sodium chloride flush  3 mL Intravenous Q12H  . tamsulosin  0.4 mg Oral Daily  . vancomycin  125 mg Oral QID   Continuous Infusions: . dextrose 5 % and 0.9% NaCl 50 mL/hr at 09/05/15 0900  . feeding supplement (NEPRO CARB STEADY) Stopped (09/05/15 0000)  . norepinephrine (LEVOPHED) Adult infusion Stopped (09/04/15 0400)  . vasopressin (PITRESSIN) infusion - *FOR SHOCK* 0.03 Units/min (09/05/15 0900)   PRN Meds:.sodium chloride, bisacodyl **OR** bisacodyl, fentaNYL (SUBLIMAZE) injection, ondansetron, oxyCODONE, sodium chloride flush, sodium chloride flush, traMADol  Current Labs: reviewed   Physical Exam:  Blood pressure 108/74, pulse 84, temperature 98.4 F (36.9 C), temperature source Oral, resp. rate 31, height 5\' 7"  (1.702 m), weight 94.1 kg (207 lb  7.3 oz), SpO2 97 %. GEN: resting, opens eyes to voice and converses ENT: NCAT EYES: EOMI, muddy sclera CV: Continuous home present, minimal audible S1 and S2 PULM: Clear bilaterally ABD: Soft, nontender SKIN: Clean bandages over xiphoid region EXT: Trace to 1+ edema Foley catheter present  Renal Studies 1. Urine Sediment 5/16: Numerous darkly pigmented granular and degenerating cellular casts, abundant scattered pigmented granular debris, numerous broad waxy cast, abundant transitional cells and squamous cells. Scattered are RTEs No erythrocytes with dysmorphic features. No WBC casts. Sediment consistent with ATN, features of CKD 2. Renal US 5/16L R 10.3cm, L 10.7cm, no obstruction 3. 5/16 CK: neg  Assessment  1. AKI, anuric with Urine sediment suggestive of ATN 1. ATN, rhabdo excluded. Doubt AIN but had significant PCN Exposure 2. Baseline SCr 1.1-1.4 3. Renal US 5/16 w/o structural / obstructive issues 4. Not responsive to lasix  5. Daily weights, Daily Renal Panel, Strict I/Os, Avoid nephrotoxins (NSAIDs, judicious IV Contrast)  2. MSSA Bacteremia and LVAD Driveline Infection: per TCTS, AHF, RCID; TEE w/o vegetation 3. Septic Shock on Imi, Rifampin, Vanc PO/IV 4. NICM, chronic sHF; LVAD present 5. Anemia in AKI 6. C Dificile colitis 7. AFib on ami  Plan 1. Will need to start RRT 2. Seems could tolerate iHD give ok BPs only on VP; will start there and if needed transition to CRRT  Pearson Grippe MD 09/05/2015, 9:43 AM  Recent Labs Lab 09/03/15 0431  09/04/15 0411 09/04/15 1512 09/05/15 0450 09/05/15 0805  NA 134*  < > 134* 135 134* 138  K 4.0  < > 3.8 3.6 3.7 3.6  CL 102  < > 104 106 103 105  CO2 18*  < > 16* 15* 16*  --   GLUCOSE 107*  < > 104* 106* 99 92  BUN 17  < > 27* 32* 36* 36*  CREATININE 3.89*  < > 5.57* 6.10* 7.13* 7.70*  CALCIUM 7.5*  < > 7.4* 7.8* 7.8*  --   PHOS 6.7*  --   --   --   --   --   < > = values in this interval not  displayed.  Recent Labs Lab 09/03/15 0431  09/04/15 0411 09/04/15 1215 09/05/15 0450 09/05/15 0805  WBC 20.0*  < > 13.8* 15.6* 12.6*  --   NEUTROABS 17.8*  --   --   --   --   --   HGB 8.5*  < > 7.9* 8.5* 8.0* 9.2*  HCT 23.8*  < > 23.0* 24.3* 23.1* 27.0*  MCV 69.0*  < > 69.9* 69.4* 70.4*  --   PLT 286  < > 212 171 156  --   < > = values in this interval not displayed.

## 2015-09-05 NOTE — Anesthesia Procedure Notes (Signed)
Procedure Name: Intubation Date/Time: 09/05/2015 1:42 PM Performed by: Raphael Gibney T Pre-anesthesia Checklist: Patient identified, Timeout performed, Emergency Drugs available, Suction available and Patient being monitored Patient Re-evaluated:Patient Re-evaluated prior to inductionOxygen Delivery Method: Circle system utilized and Simple face mask Preoxygenation: Pre-oxygenation with 100% oxygen Intubation Type: IV induction Ventilation: Mask ventilation without difficulty Laryngoscope Size: Mac and 3 Grade View: Grade II Tube type: Oral Tube size: 7.5 mm Number of attempts: 1 Airway Equipment and Method: Patient positioned with wedge pillow and Stylet Placement Confirmation: ETT inserted through vocal cords under direct vision,  positive ETCO2 and breath sounds checked- equal and bilateral Secured at: 22 cm Tube secured with: Tape Dental Injury: Teeth and Oropharynx as per pre-operative assessment

## 2015-09-05 NOTE — Progress Notes (Signed)
CT Surgery PM Rounds  Patient back from OR- wound irrigation and HD cath placement VAC has blocked with blood  from bleeding wound Will give one platelet pack HD starting for 3 hrs for acute renal failure

## 2015-09-05 NOTE — Anesthesia Preprocedure Evaluation (Addendum)
Anesthesia Evaluation  Patient identified by MRN, date of birth, ID band Patient awake    Reviewed: Allergy & Precautions, NPO status , Patient's Chart, lab work & pertinent test results  Airway Mallampati: II  TM Distance: >3 FB Neck ROM: Full    Dental no notable dental hx.    Pulmonary neg pulmonary ROS,    Pulmonary exam normal breath sounds clear to auscultation       Cardiovascular hypertension, Pt. on medications +CHF  + dysrhythmias Atrial Fibrillation + Cardiac Defibrillator + Valvular Problems/Murmurs  Rhythm:Regular Rate:Normal + Systolic murmurs Patient has LVAD  EF 20-25%   Neuro/Psych negative neurological ROS  negative psych ROS   GI/Hepatic negative GI ROS, Neg liver ROS,   Endo/Other  Hyperthyroidism   Renal/GU Renal Insufficiency and ESRFRenal disease  negative genitourinary   Musculoskeletal negative musculoskeletal ROS (+)   Abdominal   Peds negative pediatric ROS (+)  Hematology negative hematology ROS (+)   Anesthesia Other Findings   Reproductive/Obstetrics negative OB ROS                          Anesthesia Physical Anesthesia Plan  ASA: IV  Anesthesia Plan: General   Post-op Pain Management:    Induction: Intravenous  Airway Management Planned: Oral ETT  Additional Equipment:   Intra-op Plan:   Post-operative Plan: Possible Post-op intubation/ventilation  Informed Consent: I have reviewed the patients History and Physical, chart, labs and discussed the procedure including the risks, benefits and alternatives for the proposed anesthesia with the patient or authorized representative who has indicated his/her understanding and acceptance.   Dental advisory given  Plan Discussed with: CRNA and Surgeon  Anesthesia Plan Comments:        Anesthesia Quick Evaluation

## 2015-09-05 NOTE — Progress Notes (Signed)
PT Cancellation Note  Patient Details Name: [name] MRN: [id number] DOB: 05-28-1970   Cancelled Treatment:    Reason Eval/Treat Not Completed: Medical issues which prohibited therapy (Going to OR today per nurse and then HD. Check back tomorrow)Thanks.    Irwin Brakeman F 09/05/2015, 10:05 AM [facility] [phone] (pager)

## 2015-09-05 NOTE — Interval H&P Note (Signed)
History and Physical Interval Note:  09/05/2015 12:28 PM  ISEN MAYOTTE  has presented today for surgery, with the diagnosis of sternal wound  The various methods of treatment have been discussed with the patient and family. After consideration of risks, benefits and other options for treatment, the patient has consented to  Procedure(s): IRRIGATION AND DEBRIDEMENT sternal WOUND (N/A) APPLICATION OF A-CELL OF sternum (N/A) APPLICATION OF WOUND VAC to sternum (N/A) as a surgical intervention .  The patient's history has been reviewed, patient examined, no change in status, stable for surgery.  I have reviewed the patient's chart and labs.  Questions were answered to the patient's satisfaction.     Wallace Going

## 2015-09-05 NOTE — Progress Notes (Signed)
Patient ID: [name], male   DOB: September 22, 1970, 45 y.o.   MRN: [id number]    HeartMate 2 Rounding Note  Subjective:    Admitted from [facility] in [place] with driveline abscess. Blood cultures --> MSSA and has C diff. Remains on Nafcillin  and oral vancomycin.   08/27/15 S/P I &D Epigastric with hardware exposed and VAC placement.  5/11 repeat I&D and wound vac replacement.  5/11 TEE no vegetations on valves or ICD wire   Overall he has received 5 UPRBCs. Hgb back down to 8.0. M On vasopressin. BM earlier this morning ? Blood. --> -FOBT was negative   LDH 177>212>231>244  . Complains of lower extremity weakness and fatigue. Denies SOB.   LVAD INTERROGATION:  HeartMate II LVAD:  Flow 4.5 liters/min, speed 9200, power 5.0  PI 7.9. No PI events.   Objective:    Vital Signs:   Temp:  [97.6 F (36.4 C)-98.6 F (37 C)] 98.1 F (36.7 C) (05/18 0400) Pulse Rate:  [79-87] 84 (05/18 0700) Resp:  [11-30] 16 (05/18 0700) SpO2:  [92 %-100 %] 94 % (05/18 0700) Arterial Line BP: (109-153)/(69-86) 126/78 mmHg (05/18 0700) Weight:  [207 lb 7.3 oz (94.1 kg)] 207 lb 7.3 oz (94.1 kg) (05/18 0500) Last BM Date: 09/04/15 Mean arterial Pressure  80-90s    Intake/Output:   Intake/Output Summary (Last 24 hours) at 09/05/15 0712 Last data filed at 09/05/15 0700  Gross per 24 hour  Intake 2645.32 ml  Output    370 ml  Net 2275.32 ml     Physical Exam: CVP 15 personally checked.  General: In bed. Very weak.  HEENT: Sceleral icterus Neck: supple.  JVP to jaw. Carotids 2+ bilat; no bruits. No lymphadenopathy or thryomegaly appreciated. Cor: Mechanical heart sounds with LVAD hum present. Lungs: clear  Abdomen: soft, tender, distended. No hepatosplenomegaly. No bruits or masses. Good bowel sounds. Mid epigastric dressing blood stained Driveline: C/D/I; securement device intact and driveline incorporated Extremities: no cyanosis, clubbing, [name], 1+ edema R and LLE SCDs.    Neuro: alert & orientedx3, cranial nerves grossly intact. moves all 4 extremities w/o difficulty.  Telemetry: NSR 70s   Labs: Basic Metabolic Panel:  Recent Labs Lab 09/03/15 0431 09/03/15 1300 09/04/15 0411 09/04/15 1512 09/05/15 0450  NA 134* 132* 134* 135 134*  K 4.0 3.9 3.8 3.6 3.7  CL 102 102 104 106 103  CO2 18* 18* 16* 15* 16*  GLUCOSE 107* 111* 104* 106* 99  BUN 17 20 27* 32* 36*  CREATININE 3.89* 4.68* 5.57* 6.10* 7.13*  CALCIUM 7.5* 7.3* 7.4* 7.8* 7.8*  MG 1.8  --   --   --   --   PHOS 6.7*  --   --   --   --     Liver Function Tests:  Recent Labs Lab 08/30/15 0459 09/02/15 0955 09/03/15 0431 09/04/15 0411 09/05/15 0450  AST 63* 37 39 46* 41  ALT 48 31 27 31 20   ALKPHOS 108 89 96 99 96  BILITOT 5.9* 9.2* 11.4* 9.4* 8.9*  PROT 6.6 5.5* 6.0* 5.9* 5.9*  ALBUMIN 2.1* 2.1* 2.1* 1.8* 1.7*   No results for input(s): LIPASE, AMYLASE in the last 168 hours.  Recent Labs Lab 09/02/15 1045  AMMONIA 40*    CBC:  Recent Labs Lab 09/03/15 0431 09/03/15 1300 09/03/15 2055 09/04/15 0411 09/04/15 1215 09/05/15 0450  WBC 20.0* 18.3* 19.9* 13.8* 15.6* 12.6*  NEUTROABS 17.8*  --   --   --   --   --  HGB 8.5* 8.8* 8.6* 7.9* 8.5* 8.0*  HCT 23.8* 25.9* 24.5* 23.0* 24.3* 23.1*  MCV 69.0* 70.8* 69.2* 69.9* 69.4* 70.4*  PLT 286 266 213 212 171 156    INR:  Recent Labs Lab 08/30/15 0459 09/01/15 0451  INR 1.57* 1.54*    Other results:    Imaging: US Renal  09/04/2015  CLINICAL DATA:  Acute kidney injury. EXAM: RENAL / URINARY TRACT ULTRASOUND COMPLETE COMPARISON: 09/03/2015.  CT 03/2015. FINDINGS: Right Kidney: Length: 10.3 cm. Echogenicity within normal limits. No mass or hydronephrosis visualized. Left Kidney: Length: 10.7 cm. Echogenicity within normal limits. No mass or hydronephrosis visualized. Bladder: Bladder is nondistended. Foley catheter in bladder. Small right pleural effusion and minimal ascites cannot be excluded . IMPRESSION: 1. No  acute or focal renal abnormality. No hydronephrosis. Bladder is nondistended. Foley catheter in bladder. 2.  Small right pleural effusion minimal ascites cannot be excluded. Electronically Signed   By: [place]   On: 09/04/2015 07:04   Dg Chest Port 1 View  09/04/2015  CLINICAL DATA:  Left ventricular assist device. EXAM: PORTABLE CHEST 1 VIEW COMPARISON:  09/03/2015. FINDINGS: Interim placement of feeding tube, its tip is below left hemidiaphragm. Right PICC line in stable position. Left ventricular assist device noted. Cardiac pacer with lead tip projected over right ventricle. Prior CABG. Stable cardiomegaly. Lung volumes with mild basilar atelectasis and/or infiltrate. No pleural effusion or pneumothorax. IMPRESSION: 1. Interim placement of feeding tube, its tip is below left hemidiaphragm. Right PICC line stable position. 2. Left ventricular assist device again noted. Cardiac pacer stable position. Prior CABG. Stable cardiomegaly. 3. Low lung volumes with mild bibasilar atelectasis and/or infiltrate. Electronically Signed   By: Marcello Moores  Register   On: 09/04/2015 08:11   Dg Abd Portable 1v  09/04/2015  CLINICAL DATA:  Ileus EXAM: PORTABLE ABDOMEN - 1 VIEW COMPARISON:  If 09/03/2015 FINDINGS: Gastric distension. Feeding tube in the gastric antrum. No dilated large or small-bowel loops. Left ventricular assist device noted.  No acute bony abnormality. IMPRESSION: Feeding tube in the gastric antrum. Distended stomach. Otherwise normal bowel gas pattern. Electronically Signed   By: Franchot Gallo M.D.   On: 09/04/2015 08:09   Dg Abd Portable 1v  09/03/2015  CLINICAL DATA:  NG tube placement EXAM: PORTABLE ABDOMEN - 1 VIEW COMPARISON:  None. FINDINGS: Feeding tube with the tip projecting over the distal body of the stomach. There is no bowel dilatation to suggest obstruction. There is no evidence of pneumoperitoneum, portal venous gas or pneumatosis. There are no pathologic calcifications along the  expected course of the ureters. There is a left ventricular assist device noted. The osseous structures are unremarkable. IMPRESSION: Feeding tube with the tip projecting over the distal body of the stomach. Electronically Signed   By: Kathreen Devoid   On: 09/03/2015 14:23     Medications:     Scheduled Medications: . allopurinol  300 mg Oral Daily  . amiodarone  100 mg Oral Daily  .  ceFAZolin (ANCEF) IV  2 g Intravenous To SS-Surg  . citalopram  20 mg Oral Daily  . docusate sodium  200 mg Oral Daily  . feeding supplement (PRO-STAT SUGAR FREE 64)  30 mL Per Tube TID  . ferrous sulfate  325 mg Oral BID WC  . guaiFENesin  600 mg Oral BID  . imipenem-cilastatin  500 mg Intravenous Q12H  . metoCLOPramide (REGLAN) injection  10 mg Intravenous Q6H  . pantoprazole  40 mg Oral Daily  .  pregabalin  75 mg Oral BID  . rifampin  300 mg Oral Q8H  . simethicone  80 mg Oral QID  . sodium chloride flush  10-40 mL Intracatheter Q12H  . sodium chloride flush  3 mL Intravenous Q12H  . tamsulosin  0.4 mg Oral Daily  . vancomycin  125 mg Oral QID    Infusions: . dextrose 5 % and 0.9% NaCl 50 mL/hr at 09/05/15 0700  . feeding supplement (NEPRO CARB STEADY) Stopped (09/05/15 0000)  . norepinephrine (LEVOPHED) Adult infusion Stopped (09/04/15 0400)  . vasopressin (PITRESSIN) infusion - *FOR SHOCK* 0.03 Units/min (09/05/15 0700)    PRN Medications: sodium chloride, bisacodyl **OR** bisacodyl, fentaNYL (SUBLIMAZE) injection, ondansetron, oxyCODONE, sodium chloride flush, sodium chloride flush, traMADol   Assessment:   1. LVAD Complication- Driveline abscess 2. MSSA bacteremia -> septic shock 3. Acute/Chronic systolic HF s/p VAD placement 9/16 4. C. Difficile colitis 5. 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 Microcytic anemia 8. Urinary retention 9. Hypokalelmia 10. HTN 11. Anemia 12. Vomiting.  13. Sever Malnutrition.   79. ARF  Plan/Discussion:    S/P  I&D LVAD driveline abscess. Exposed hardware. ID following. Continue po Vanc for C diff, imipenem, and Rifampin. All blood cultures NGTD. Underwent deeper debridement and wound vac replacement 5/11. Abd abscess with few staphylococcus aureus. No vegetations on TEE.  Maps trending up. Off norepi. Continue vasopressin. Renal function much worse 4.68>5.57>7.1.Volume status trending up. Give 80 mg IV lasix.  Nephrology following. Place HD cath today.   OR today for exploration.  Not ready for flap. WBC trending down 20>13.8 >12.8. On vanc, imipenum, and rifampin. ESBL sputum and MSSA blood.   C. Diff- Still having loose stools. Continue oral vanc.   Overall he has received 5 UPRBCs. Hgb back down to 8. Give 2 unit PRBCs. He has been off heparin with abd bleeding. Anticipate restarting heparin after OR and HD cath placement. LDH trending up.  FOBT negative. Use SCDS.   Tube feeds on hold for OR today.  Prealbumin 8.8 . Nutrition following.   Consult PT/OT.   VAD parameters stable.   I reviewed the LVAD parameters from today, and compared the results to the patient's prior recorded data.  No programming changes were made.  The LVAD is functioning within specified parameters.  The patient performs LVAD self-test daily.  LVAD interrogation was negative for any significant power changes, alarms or PI events/speed drops.  LVAD equipment check completed and is in good working order.  Back-up equipment present.   LVAD education done on emergency procedures and precautions and reviewed exit site care.  Length of Stay: Victoria NP-C  09/05/2015, 7:12 AM  VAD Team --- VAD ISSUES ONLY--- Pager [phone] (7am - 7am)  Advanced Heart Failure Team  Pager [phone] (M-F; 7a - 4p)  Please contact [facility] for night-coverage after hours (4p -7a ) and weekends on [email or web address]   Patient seen and examined with Darrick Grinder, NP. We discussed all aspects of the encounter. I agree with the assessment and plan as  stated above.   Went back to OR today for wound debridement. Wound Vac now clotted.   Trialysis catheter placed and CVVHD begun. Tolerated well with 3L off but still markedly volume overloaded. MAP 105. Can stop vasopressin.   Continue broad spectrum abx.   Remains off all anticoagulation due to bleeding. Need to start heparin as soon as we can.   He remaans critically ill.  VAD parameters ok.   The patient is critically ill with multiple organ systems failure and requires high complexity decision making for assessment and support, frequent evaluation and titration of therapies, application of advanced monitoring technologies and extensive interpretation of multiple databases.   Critical Care Time devoted to patient care services described in this note is 35 Minutes.  Gay Moncivais,MD 11:44 PM

## 2015-09-05 NOTE — Progress Notes (Signed)
CSW met at bedside with patient to offer support. Patient stating not feeling well today and planned surgical procedure for the wound. Patient stated he will start dialysis this afternoon and hopeful to have some relief from the fluid. Patient appears despondent but states "trying to stay positive". CSW asked permission to contact sister Pam in [facility] to offer support and patient provided permission. CSW offered supportive intervention and continues to follow for supportive intervention. CSW contacted patient's sister Jeannene Patella who plans to visit this weekend. Pam is caring for patient's puppy Duke who patient misses terribly. Pam has sent video of puppy to patient via phone which has helped lift patient's spirits tremendously. CSW will touch base with family and continue to offer support as needed. Raquel Sarna, LCSW [phone]

## 2015-09-05 NOTE — Brief Op Note (Signed)
08/26/2015 - 09/05/2015  2:26 PM  PATIENT:  Inez Catalina  45 y.o. male  PRE-OPERATIVE DIAGNOSIS:  sternal wound, acute renal failure  POST-OPERATIVE DIAGNOSIS:  sternal wound, acute renal failure  PROCEDURE:  Insertion of dialysis catheter right subclavian vein  SURGEON:  Surgeon(s) and Role:     Gaye Pollack, MD  PHYSICIAN ASSISTANT: none    ANESTHESIA:   general  EBL:  Total I/O In: 1021.5 [I.V.:306.5; Blood:665; IV Piggyback:50] Out: 45 [Urine:45]   COUNTS:  YES  TOURNIQUET:  * No tourniquets in log *  DICTATION: .Note written in EPIC  PLAN OF CARE: Admit to inpatient   PATIENT DISPOSITION:  ICU

## 2015-09-05 NOTE — Progress Notes (Signed)
Nutrition Follow Up  DOCUMENTATION CODES:   Not applicable  INTERVENTION:    Continue Nepro formula at goal rate of 45 ml/hr with Prostat liquid protein 30 ml TID via tube  Total TF regimen to provide 2244 kcals, 132 gm protein, 785 ml of free water daily  NUTRITION DIAGNOSIS:   Inadequate oral intake related to poor appetite as evidenced by  (RN report), ongoing  GOAL:   Patient will meet greater than or equal to 90% of their needs, met  MONITOR:   TF tolerance, PO intake, Labs, Weight trends, I & O's  ASSESSMENT:   45 yo Male admitted for treatment of a sternal / chest wound. The patient is currently in the Unit and recovering from surgery earlier this week. He had an LVAD placed. He had a trip to [place] and came back with concerns of infection at the site of the pump. He was taken to the OR for debridement. There was purulence at the site. Irrigation and debridement was done. Acell and the VAC was placed. He is recovering now. Attempts are being made to see if he is a candidate for transplant. Will need coverage over the pump. Pictures seen and significant amount exposed.  Patient s/p procedures 5/11: DEBRIDEMENT OF CHEST WOUND WOUND VAC CHANGE  APPLICATION OF A-CELL POWDER  Spoke with RN >> pt eating some >> no % meal intake available. Nepro formula via [place] feeding tube currently off for surgery. Pt had vomiting episode yesterday, however, TF was resumed (after 4 hours) and pt was tolerating at goal rate prior to stopping. He is receiving IV Reglan >> orders modified 5/17.  Diet Order:  Diet NPO time specified  Skin:  Wound (see comment) (chest wound)  Last BM:  5/18  Height:   Ht Readings from Last 1 Encounters:  08/26/15 '5\' 7"'$  (1.702 m)    Weight:   Wt Readings from Last 1 Encounters:  09/05/15 207 lb 7.3 oz (94.1 kg)    Ideal Body Weight:  67.2 kg  BMI:  Body mass index is 32.48 kg/(m^2).  Estimated Nutritional Needs:   Kcal:   2100-2300  Protein:  120-130 gm  Fluid:  per MD  EDUCATION NEEDS:   No education needs identified at this time  Arthur Holms, RD, LDN Pager #: [phone] After-Hours Pager #: [phone]

## 2015-09-05 NOTE — Progress Notes (Signed)
This nurse called Leafy Ro, [facility] coordinator, aware of pt's surgery and will be present in preop and in OR 9 per her. This nurse also called and spoke with Corene Cornea, [employer] rep, whom needs to be here for postop interrogation around 1345 per Dr. Kalman Shan.

## 2015-09-05 NOTE — Progress Notes (Signed)
Dr. Prescott Gum paged at this time; wound vac cannister changed X2 due to foam in top of cannister; wound vac stating blockage; per MD, leave wound vac sponge in place, but turn wound vac off; starting tomorrow MD will do wet to dry dressing changes; will cont. To monitor.  Ruben Reason

## 2015-09-05 NOTE — Progress Notes (Signed)
[facility] for Infectious Disease    Date of Admission:  08/26/2015   Total days of antibiotics 11        Day 4 imi (previously on nafcillin)        Day 10 rif        Day 10 oral vanco   ID: [name] is a 45 y.o. 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 TEE on 5/11 negative Active Problems:   Left ventricular assist device (LVAD) complication   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HCC)   Abscess of abdominal wall   Acute on chronic systolic (congestive) heart failure (HCC)   Enteritis due to Clostridium difficile   Staphylococcus aureus bacteremia   Septic shock (HCC)    Subjective: Remains anuric, going to OR for I x D, exploration but also will need HD catheter due to worsening AKI  24hr event = leukoctysosis improved to 12.6, cr 7.7  Medications:  . allopurinol  300 mg Oral Daily  . amiodarone  100 mg Oral Daily  .  ceFAZolin (ANCEF) IV  2 g Intravenous To SS-Surg  . citalopram  20 mg Oral Daily  . docusate sodium  200 mg Oral Daily  . feeding supplement (PRO-STAT SUGAR FREE 64)  30 mL Per Tube TID  . ferrous sulfate  325 mg Oral BID WC  . guaiFENesin  600 mg Oral BID  . imipenem-cilastatin  500 mg Intravenous Q12H  . metoCLOPramide (REGLAN) injection  5 mg Intravenous Q6H  . pantoprazole  40 mg Oral Daily  . pregabalin  75 mg Oral BID  . rifampin  300 mg Oral Q8H  . simethicone  80 mg Oral QID  . sodium chloride flush  10-40 mL Intracatheter Q12H  . sodium chloride flush  3 mL Intravenous Q12H  . tamsulosin  0.4 mg Oral Daily  . vancomycin  125 mg Oral QID    Objective: Vital signs in last 24 hours: Temp:  [98.1 F (36.7 C)-98.6 F (37 C)] 98.4 F (36.9 C) (05/18 0908) Pulse Rate:  [79-87] 84 (05/18 0908) Resp:  [11-31] 31 (05/18 0908) SpO2:  [90 %-99 %] 97 %  (05/18 0908) Arterial Line BP: (109-151)/(69-91) 138/81 mmHg (05/18 0908) Weight:  [207 lb 7.3 oz (94.1 kg)] 207 lb 7.3 oz (94.1 kg) (05/18 0500) Physical Exam  Constitutional: He is sleeping. Chronically ill. No distress.  HENT: scleral icterus, with edematous conjunctiva, ng in place Chest wall: right Dixon,bleeding around dressing Cardiovascular: +LVAD motor heard throughout precordium Pulmonary/Chest: Effort normal and breath sounds normal. No respiratory distress. He has no wheezes.  Abdominal: Soft. Dressing in place Ext: warm Skin: Skin is warm and dry. No rash noted. No erythema.     Lab Results  Recent Labs  09/04/15 1215 09/04/15 1512 09/05/15 0450 09/05/15 0805  WBC 15.6*  --  12.6*  --   HGB 8.5*  --  8.0* 9.2*  HCT 24.3*  --  23.1* 27.0*  NA  --  135 134* 138  K  --  3.6 3.7 3.6  CL  --  106 103 105  CO2  --  15* 16*  --   BUN  --  32* 36* 36*  CREATININE  --  6.10* 7.13* 7.70*   Liver Panel  Recent Labs  09/02/15 0955  09/04/15 0411 09/05/15 0450  PROT 5.5*  < > 5.9* 5.9*  ALBUMIN 2.1*  < > 1.8* 1.7*  AST 37  < > 46* 41  ALT 31  < > 31 20  ALKPHOS 89  < > 99 96  BILITOT 9.2*  < > 9.4* 8.9*  BILIDIR 5.9*  --   --   --   IBILI 3.3*  --   --   --   < > = values in this interval not displayed.  Microbiology: 5/15 trach aspirate cx: few GNR 5/9 wound cx; MSSA Studies/Results: US Renal  09/04/2015  CLINICAL DATA:  Acute kidney injury. EXAM: RENAL / URINARY TRACT ULTRASOUND COMPLETE COMPARISON: 09/03/2015.  CT 03/2015. FINDINGS: Right Kidney: Length: 10.3 cm. Echogenicity within normal limits. No mass or hydronephrosis visualized. Left Kidney: Length: 10.7 cm. Echogenicity within normal limits. No mass or hydronephrosis visualized. Bladder: Bladder is nondistended. Foley catheter in bladder. Small right pleural effusion and minimal ascites cannot be excluded . IMPRESSION: 1. No acute or focal renal abnormality. No hydronephrosis. Bladder is nondistended.  Foley catheter in bladder. 2.  Small right pleural effusion minimal ascites cannot be excluded. Electronically Signed   By: [place]   On: 09/04/2015 07:04   Dg Chest Port 1 View  09/04/2015  CLINICAL DATA:  Left ventricular assist device. EXAM: PORTABLE CHEST 1 VIEW COMPARISON:  09/03/2015. FINDINGS: Interim placement of feeding tube, its tip is below left hemidiaphragm. Right PICC line in stable position. Left ventricular assist device noted. Cardiac pacer with lead tip projected over right ventricle. Prior CABG. Stable cardiomegaly. Lung volumes with mild basilar atelectasis and/or infiltrate. No pleural effusion or pneumothorax. IMPRESSION: 1. Interim placement of feeding tube, its tip is below left hemidiaphragm. Right PICC line stable position. 2. Left ventricular assist device again noted. Cardiac pacer stable position. Prior CABG. Stable cardiomegaly. 3. Low lung volumes with mild bibasilar atelectasis and/or infiltrate. Electronically Signed   By: Marcello Moores  Register   On: 09/04/2015 08:11   Dg Abd Portable 1v  09/04/2015  CLINICAL DATA:  Ileus EXAM: PORTABLE ABDOMEN - 1 VIEW COMPARISON:  If 09/03/2015 FINDINGS: Gastric distension. Feeding tube in the gastric antrum. No dilated large or small-bowel loops. Left ventricular assist device noted.  No acute bony abnormality. IMPRESSION: Feeding tube in the gastric antrum. Distended stomach. Otherwise normal bowel gas pattern. Electronically Signed   By: Franchot Gallo M.D.   On: 09/04/2015 08:09   Dg Abd Portable 1v  09/03/2015  CLINICAL DATA:  NG tube placement EXAM: PORTABLE ABDOMEN - 1 VIEW COMPARISON:  None. FINDINGS: Feeding tube with the tip projecting over the distal body of the stomach. There is no bowel dilatation to suggest obstruction. There is no evidence of pneumoperitoneum, portal venous gas or pneumatosis. There are no pathologic calcifications along the expected course of the ureters. There is a left ventricular assist device noted.  The osseous structures are unremarkable. IMPRESSION: Feeding tube with the tip projecting over the distal body of the stomach. Electronically Signed   By: Kathreen Devoid   On: 09/03/2015 14:23     Assessment/Plan: 45yo M with MSSA LVAD drive line and abdominal wall infection complicated with c.difficile, oligouric AKI, hypotension with pressors, aspirate pneumonia, and AKI requiring CRRT POD # 0 for debridement today, plan for flap repair on 5/22  Aspirate pneumonia = klebsiella isolated which is sensitive to ceftriaxone. We will de-escalate to ceftriaxone 2gm IV daily   Worsening AKI = now requiring CRRT.   vanco is not systemically absorbed from  the gi tract  cdifficile infection= continue with oral vanco125mg  Q6 by mouth  MSSA LVAD drive line/abdominal wall infection = ceftriaxone will cover MSSA for the time being. We will change back to cefazolin on 5/22., continue with rifampin but may consider doing only 6 wk in the acute setting  [facility], [facility] for Infectious Diseases Cell: [phone] Pager: [phone]  09/05/2015, 9:35 AM

## 2015-09-05 NOTE — Brief Op Note (Signed)
08/26/2015 - 09/05/2015  3:04 PM  PATIENT:  [name]  45 y.o. male  PRE-OPERATIVE DIAGNOSIS:  sternal wound  POST-OPERATIVE DIAGNOSIS:  sternal wound  PROCEDURE:  Procedure(s): IRRIGATION AND DEBRIDEMENT sternal WOUND (N/A) APPLICATION OF A-CELL OF sternum (N/A) APPLICATION OF WOUND VAC to sternum (N/A) INSERTION OF DIALYSIS CATHETER;RIGHT SUBCLAVIAN (Right)  SURGEON:  Surgeon(s) and Role:    * Gaye Pollack, MD - [place], DO - Primary  PHYSICIAN ASSISTANT: Shawn Rayburn, PA  ASSISTANTS: none   ANESTHESIA:   general  EBL:  Total I/O In: 1021.5 [I.V.:306.5; Blood:665; IV Piggyback:50] Out: 45 [Urine:45]  BLOOD ADMINISTERED:none  DRAINS: none   LOCAL MEDICATIONS USED:  NONE  SPECIMEN:  No Specimen  DISPOSITION OF SPECIMEN:  N/A  COUNTS:  YES  TOURNIQUET:  * No tourniquets in log *  DICTATION: .Dragon Dictation  PLAN OF CARE: Admit to inpatient   PATIENT DISPOSITION:  PACU - hemodynamically stable.   Delay start of Pharmacological VTE agent (>24hrs) due to surgical blood loss or risk of bleeding: no

## 2015-09-06 ENCOUNTER — Inpatient Hospital Stay (HOSPITAL_COMMUNITY): Payer: 59

## 2015-09-06 ENCOUNTER — Other Ambulatory Visit: Payer: Self-pay | Admitting: Plastic Surgery

## 2015-09-06 ENCOUNTER — Encounter (HOSPITAL_COMMUNITY): Payer: Self-pay | Admitting: Plastic Surgery

## 2015-09-06 DIAGNOSIS — S21102A Unspecified open wound of left front wall of thorax without penetration into thoracic cavity, initial encounter: Secondary | ICD-10-CM

## 2015-09-06 DIAGNOSIS — I5023 Acute on chronic systolic (congestive) heart failure: Secondary | ICD-10-CM

## 2015-09-06 DIAGNOSIS — Z95811 Presence of heart assist device: Secondary | ICD-10-CM

## 2015-09-06 LAB — HEPARIN LEVEL (UNFRACTIONATED): Heparin Unfractionated: 1 IU/mL — ABNORMAL HIGH (ref 0.30–0.70)

## 2015-09-06 LAB — CARBOXYHEMOGLOBIN
CARBOXYHEMOGLOBIN: 1.6 % — AB (ref 0.5–1.5)
METHEMOGLOBIN: 1.4 % (ref 0.0–1.5)
O2 SAT: 70.9 %
Total hemoglobin: 9.1 g/dL — ABNORMAL LOW (ref 13.5–18.0)

## 2015-09-06 LAB — GLUCOSE, CAPILLARY
GLUCOSE-CAPILLARY: 101 mg/dL — AB (ref 65–99)
Glucose-Capillary: 89 mg/dL (ref 65–99)
Glucose-Capillary: 134 mg/dL — ABNORMAL HIGH (ref 65–99)
Glucose-Capillary: 83 mg/dL (ref 65–99)
Glucose-Capillary: 85 mg/dL (ref 65–99)

## 2015-09-06 LAB — COMPREHENSIVE METABOLIC PANEL
ALK PHOS: 109 U/L (ref 38–126)
ALT: 17 U/L (ref 17–63)
AST: 46 U/L — AB (ref 15–41)
Albumin: 1.7 g/dL — ABNORMAL LOW (ref 3.5–5.0)
Anion gap: 15 (ref 5–15)
BUN: 29 mg/dL — AB (ref 6–20)
CO2: 21 mmol/L — AB (ref 22–32)
CREATININE: 5.9 mg/dL — AB (ref 0.61–1.24)
Calcium: 8.1 mg/dL — ABNORMAL LOW (ref 8.9–10.3)
Chloride: 102 mmol/L (ref 101–111)
GFR calc non Af Amer: 10 mL/min — ABNORMAL LOW (ref >60)
GFR, EST AFRICAN AMERICAN: 12 mL/min — AB (ref >60)
Glucose, Bld: 93 mg/dL (ref 65–99)
Potassium: 3.3 mmol/L — ABNORMAL LOW (ref 3.5–5.1)
SODIUM: 138 mmol/L (ref 135–145)
Total Bilirubin: 9.1 mg/dL — ABNORMAL HIGH (ref 0.3–1.2)
Total Protein: 5.8 g/dL — ABNORMAL LOW (ref 6.5–8.1)

## 2015-09-06 LAB — PREPARE PLATELET PHERESIS: Unit division: 0

## 2015-09-06 LAB — CBC
HCT: 25.2 % — ABNORMAL LOW (ref 39.0–52.0)
Hemoglobin: 8.6 g/dL — ABNORMAL LOW (ref 13.0–17.0)
MCH: 24.7 pg — ABNORMAL LOW (ref 26.0–34.0)
MCHC: 34.1 g/dL (ref 30.0–36.0)
MCV: 72.4 fL — ABNORMAL LOW (ref 78.0–100.0)
Platelets: 175 10*3/uL (ref 150–400)
RBC: 3.48 MIL/uL — ABNORMAL LOW (ref 4.22–5.81)
RDW: 25 % — ABNORMAL HIGH (ref 11.5–15.5)
WBC: 15.4 10*3/uL — ABNORMAL HIGH (ref 4.0–10.5)

## 2015-09-06 LAB — LACTATE DEHYDROGENASE: LDH: 263 U/L — ABNORMAL HIGH (ref 98–192)

## 2015-09-06 MED ORDER — NEPRO/CARBSTEADY PO LIQD
1000.0000 mL | ORAL | Status: DC
  Filled 2015-09-06 (×2): qty 1000

## 2015-09-06 MED ORDER — HEPARIN (PORCINE) IN NACL 100-0.45 UNIT/ML-% IJ SOLN
1000.0000 [IU]/h | INTRAMUSCULAR | Status: AC
  Administered 2015-09-06 – 2015-09-07 (×2): 1000 [IU]/h via INTRAVENOUS
  Filled 2015-09-06 (×3): qty 250

## 2015-09-06 NOTE — Evaluation (Signed)
Physical Therapy Evaluation Patient Details Name: [name] MRN: [id number] DOB: 06/17/1970 Today's Date: 09/06/2015   History of Present Illness  [name] is a 45 y.o. 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 TEE on 5/11 negative  Clinical Impression  Pt admitted with above diagnosis. Pt currently with functional limitations due to the deficits listed below (see PT Problem List). Pt was able to sit EOB for 15 min with mod to min guard assist.  Able to kick LEs but would lose balance when doing so.  Needs Rehab prior to d/c home with family assist.  Will follow acutely.  Pt will benefit from skilled PT to increase their independence and safety with mobility to allow discharge to the venue listed below.    Follow Up Recommendations Supervision/Assistance - 24 hour;CIR    Equipment Recommendations  Other (comment) (TBA)    Recommendations for Other Services Rehab consult     Precautions / Restrictions Precautions Precautions: Fall Restrictions Weight Bearing Restrictions: No      Mobility  Bed Mobility Overal bed mobility: Needs Assistance;+2 for physical assistance Bed Mobility: Supine to Sit     Supine to sit: Mod assist;+2 for physical assistance     General bed mobility comments: Helicopter technique with pad.   Transfers                 General transfer comment: unable  Ambulation/Gait                Stairs            Wheelchair Mobility    Modified Rankin (Stroke Patients Only)       Balance Overall balance assessment: Needs assistance Sitting-balance support: Bilateral upper extremity supported;Feet supported Sitting balance-Leahy Scale: Poor Sitting balance - Comments: Pt sat 15 min at EOB with varying assist from mod to min guard at times.  Pt needed  constant cues for anterior lean.  Postural control: Posterior lean                                   Pertinent Vitals/Pain Pain Assessment: Faces Faces Pain Scale: Hurts little more Pain Location: abdomen with movement Pain Descriptors / Indicators: Aching;Grimacing;Guarding Pain Intervention(s): Limited activity within patient's tolerance;Monitored during session;Premedicated before session;Repositioned  VSS on LVAD equipment and other VSS    Home Living Family/patient expects to be discharged to:: Private residence Living Arrangements: Alone Available Help at Discharge: Family;Available 24 hours/day (sister and brother are coming to assist) Type of Home: House Home Access: Level entry     Home Layout: One level Home Equipment: None      Prior Function Level of Independence: Independent         Comments: Driving     Hand Dominance   Dominant Hand: Right    Extremity/Trunk Assessment   Upper Extremity Assessment: Defer to OT evaluation           Lower Extremity Assessment: Generalized weakness      Cervical / Trunk Assessment: Normal  Communication   Communication: No difficulties  Cognition Arousal/Alertness: Awake/alert Behavior During Therapy: WFL for tasks assessed/performed Overall Cognitive Status: Within Functional Limits for tasks assessed  General Comments      Exercises General Exercises - Lower Extremity Ankle Circles/Pumps: AROM;Both;10 reps;Seated Long Arc Quad: AROM;Both;10 reps;Seated Hip Flexion/Marching: AAROM;Both;5 reps;Seated      Assessment/Plan    PT Assessment Patient needs continued PT services  PT Diagnosis Generalized weakness   PT Problem List Decreased activity tolerance;Decreased balance;Decreased mobility;Decreased strength;Decreased range of motion;Decreased knowledge of use of DME;Decreased safety awareness;Decreased knowledge of precautions;Pain  PT Treatment  Interventions DME instruction;Gait training;Functional mobility training;Therapeutic activities;Therapeutic exercise;Balance training;Patient/family education   PT Goals (Current goals can be found in the Care Plan section) Acute Rehab PT Goals Patient Stated Goal: to get better PT Goal Formulation: With patient Time For Goal Achievement: 09/20/15 Potential to Achieve Goals: Good    Frequency Min 3X/week   Barriers to discharge        Co-evaluation               End of Session Equipment Utilized During Treatment: Gait belt;Oxygen Activity Tolerance: Patient limited by fatigue Patient left: in bed;with call bell/phone within reach;with bed alarm set;with nursing/sitter in room;with SCD's reapplied Nurse Communication: Mobility status         Time: [id number] PT Time Calculation (min) (ACUTE ONLY): 31 min   Charges:   PT Evaluation $PT Eval High Complexity: 1 Procedure PT Treatments $Therapeutic Activity: 8-22 mins   PT G CodesDenice [name] 2015-09-09, 4:26 PM [name],PT Acute Rehabilitation [phone] (pager)

## 2015-09-06 NOTE — Progress Notes (Signed)
Admit: 08/26/2015 LOS: 11  35M normal baseline SCr admitted with MSSA Driveline Infection and Abscess, C diff colitis, now with septic shock, and AKI  Subjective:  Started HD overnight, 3L UF, tolerated  Off pressors On RA On D5W + NS No inc in UOP   05/18 0701 - 05/19 0700 In: 2795.6 [I.V.:1626.6; Blood:859; NG/GT:260; IV Piggyback:50] Out: 3273 [Urine:123; Drains:100; Blood:50]  Filed Weights   09/05/15 1730 09/05/15 2100 09/06/15 0600  Weight: 95 kg (209 lb 7 oz) 92 kg (202 lb 13.2 oz) 89.9 kg (198 lb 3.1 oz)    Scheduled Meds: . allopurinol  300 mg Oral Daily  . amiodarone  100 mg Oral Daily  . cefTRIAXone (ROCEPHIN)  IV  2 g Intravenous Q24H  . citalopram  20 mg Oral Daily  . docusate sodium  200 mg Oral Daily  . feeding supplement (NEPRO CARB STEADY)  237 mL Per Tube TID WC  . feeding supplement (PRO-STAT SUGAR FREE 64)  30 mL Per Tube TID  . ferrous sulfate  325 mg Oral BID WC  . guaiFENesin  600 mg Oral BID  . metoCLOPramide (REGLAN) injection  5 mg Intravenous Q6H  . pantoprazole  40 mg Oral Daily  . pregabalin  75 mg Oral BID  . rifampin  300 mg Oral Q8H  . simethicone  80 mg Oral QID  . sodium chloride flush  10-40 mL Intracatheter Q12H  . sodium chloride flush  3 mL Intravenous Q12H  . tamsulosin  0.4 mg Oral Daily  . vancomycin  125 mg Oral QID   Continuous Infusions: . dextrose 5 % and 0.9% NaCl 50 mL/hr at 09/06/15 0800  . feeding supplement (NEPRO CARB STEADY) 1,000 mL (09/05/15 1804)  . heparin    . norepinephrine (LEVOPHED) Adult infusion Stopped (09/05/15 1553)  . vasopressin (PITRESSIN) infusion - *FOR SHOCK* Stopped (09/05/15 2200)   PRN Meds:.sodium chloride, sodium chloride, sodium chloride, alteplase, bisacodyl **OR** bisacodyl, fentaNYL (SUBLIMAZE) injection, heparin, ondansetron, oxyCODONE, sodium chloride flush, sodium chloride flush, traMADol  Current Labs: reviewed   Physical Exam:  Blood pressure 134/78, pulse 86, temperature 98.6 F  (37 C), temperature source Oral, resp. rate 24, height 5\' 7"  (1.702 m), weight 89.9 kg (198 lb 3.1 oz), SpO2 99 %. GEN: resting, opens eyes to voice and converses ENT: NCAT EYES: EOMI, muddy sclera CV: Continuous home present, minimal audible S1 and S2 PULM: Clear bilaterally ABD: Soft, nontender SKIN: Clean bandages over xiphoid region EXT: Trace to 1+ edema Foley catheter present  Renal Studies 1. Urine Sediment 5/16: Numerous darkly pigmented granular and degenerating cellular casts, abundant scattered pigmented granular debris, numerous broad waxy cast, abundant transitional cells and squamous cells. Scattered are RTEs No erythrocytes with dysmorphic features. No WBC casts. Sediment consistent with ATN, features of CKD 2. Renal US 5/16L R 10.3cm, L 10.7cm, no obstruction 3. 5/16 CK: neg 4. 5/18: start iHD  Assessment  1. AKI, anuric with Urine sediment consistent with ATN 1. ATN, rhabdo excluded. Doubt AIN but had significant PCN Exposure  2. Baseline SCr 1.1-1.4 3. Renal US 5/16 w/o structural / obstructive issues 4. Not responsive to lasix  5. Daily weights, Daily Renal Panel, Strict I/Os, Avoid nephrotoxins (NSAIDs, judicious IV Contrast)  2. MSSA Bacteremia and LVAD Driveline Infection: per TCTS, AHF, RCID; TEE w/o vegetation 3. Septic Shock on ceftriaxone, Rifampin, Vanc PO/IV 4. NICM, chronic sHF; LVAD present 5. Anemia in AKI 6. C Dificile colitis 7. AFib on ami  Plan 1. Stop IVFs  2. Tentative for IHD again tomorrow, remains 17lb above admit weight  Pearson Grippe MD 09/06/2015, 8:44 AM   Recent Labs Lab 09/03/15 0431  09/04/15 1512 09/05/15 0450 09/05/15 0805 09/06/15 0415  NA 134*  < > 135 134* 138 138  K 4.0  < > 3.6 3.7 3.6 3.3*  CL 102  < > 106 103 105 102  CO2 18*  < > 15* 16*  --  21*  GLUCOSE 107*  < > 106* 99 92 93  BUN 17  < > 32* 36* 36* 29*  CREATININE 3.89*  < > 6.10* 7.13* 7.70* 5.90*  CALCIUM 7.5*  < > 7.8* 7.8*  --  8.1*  PHOS 6.7*   --   --   --   --   --   < > = values in this interval not displayed.  Recent Labs Lab 09/03/15 0431  09/04/15 1215 09/05/15 0450 09/05/15 0805 09/06/15 0415  WBC 20.0*  < > 15.6* 12.6*  --  15.4*  NEUTROABS 17.8*  --   --   --   --   --   HGB 8.5*  < > 8.5* 8.0* 9.2* 8.6*  HCT 23.8*  < > 24.3* 23.1* 27.0* 25.2*  MCV 69.0*  < > 69.4* 70.4*  --  72.4*  PLT 286  < > 171 156  --  175  < > = values in this interval not displayed.

## 2015-09-06 NOTE — Progress Notes (Signed)
ANTICOAGULATION CONSULT NOTE - Follow Up Consult  Pharmacy Consult for Heparin Indication: LVAD  Allergies  Allergen Reactions  . Phytonadione Other (See Comments)    Patient has LVAD: please check with LVAD coordinator on call or LVAD MD on call before reversal of anticoagulation with vit k    Patient Measurements: Height: 5\' 7"  (170.2 cm) Weight: 198 lb 3.1 oz (89.9 kg) IBW/kg (Calculated) : 66.1 Heparin Dosing Weight: 85kg  Vital Signs: Temp: 98.6 F (37 C) (05/19 0843) Temp Source: Oral (05/19 0843) Pulse Rate: 82 (05/19 1000)  Labs:  Recent Labs  09/04/15 1215  09/05/15 0450 09/05/15 0805 09/06/15 0415  HGB 8.5*  --  8.0* 9.2* 8.6*  HCT 24.3*  --  23.1* 27.0* 25.2*  PLT 171  --  156  --  175  CREATININE  --   < > 7.13* 7.70* 5.90*  < > = values in this interval not displayed.  Estimated Creatinine Clearance: 16.6 mL/min (by C-G formula based on Cr of 5.9).  Assessment: 45yom on coumadin pta for his LVAD, admitted with driveline abscess, sternal wound infection, and MSSA bacteremia. INR 2.82 on admit, coumadin held, and heparin bridge started 5/10 pending OR trips for wound debridement. Heparin was held 5/15 when he bled into his wound and Hgb dropped (9.3 > 8). Most recent OR trip yesterday for debridement and he also had wound vac placed which clotted overnight. Plan is to return to the OR Monday for external wound closure system application.  LDH now rising so heparin to be resumed. Hgb low but stable. Heparin levels previously subtherapeutic on 1000-1300 units/hr but Dr. Darcey Nora wants to start at 1000 units/hr (max 1200 units/hr) and target lower goal.  Goal of Therapy:  Heparin level 0.2-0.3 units/ml Monitor platelets by anticoagulation protocol: Yes   Plan:  1) Start heparin at 1000 units/hr 2) Check 8 hour heparin level 3) All rate adjustments to be discussed with Dr. Ike Bene, Benjamine Sprague 09/06/2015,11:35 AM

## 2015-09-06 NOTE — Progress Notes (Signed)
1 Day Post-Op Procedure(s) (LRB): IRRIGATION AND DEBRIDEMENT sternal WOUND (N/A) APPLICATION OF A-CELL OF sternum (N/A) APPLICATION OF WOUND VAC to sternum (N/A) INSERTION OF DIALYSIS CATHETER;RIGHT SUBCLAVIAN (Right) Subjective: Patient had stable night after wound debridement, wound VAC placement and trialysis hemodialysis catheter placement yesterday.  Patient had HD for 3 hours to remove fluid-chest x-ray is improved this a.m. Wound VAC sponge clotted from bleeding-this will be removed and converted to wet-to-dry dressings.  Exposed VAD titanium needs to be covered-this will be performed Monday in OR by Dr. Marla Roe and myself with external wound closure system application. Patient currently in poor metabolic condition for muscle flap surgery. We'll try to advance tube feeds as tolerated and mobilize patient as tolerated with physical therapy. Patient to receive pack cell transfusion for low hemoglobin With LDH continuing to rise will resume low-dose heparin to prevent pump thrombus Objective: Vital signs in last 24 hours: Temp:  [97.8 F (36.6 C)-98.7 F (37.1 C)] 98.6 F (37 C) (05/19 0843) Pulse Rate:  [73-89] 80 (05/19 0900) Cardiac Rhythm:  [-] Normal sinus rhythm (05/19 0800) Resp:  [15-28] 20 (05/19 0900) BP: (128-141)/(58-86) 134/78 mmHg (05/18 2200) SpO2:  [93 %-100 %] 100 % (05/19 0900) Arterial Line BP: (103-147)/(64-87) 115/73 mmHg (05/19 0900) Weight:  [198 lb 3.1 oz (89.9 kg)-209 lb 7 oz (95 kg)] 198 lb 3.1 oz (89.9 kg) (05/19 0600)  Hemodynamic parameters for last 24 hours: CVP:  [10 mmHg-26 mmHg] 17 mmHg  Intake/Output from previous day: 05/18 0701 - 05/19 0700 In: 2795.6 [I.V.:1626.6; Blood:859; NG/GT:260; IV Piggyback:50] Out: 3273 [Urine:123; Drains:100; Blood:50] Intake/Output this shift: Total I/O In: 97 [I.V.:50; NG/GT:47] Out: 10 [Urine:10]  Edematous but neuro intact Abdomen slightly distended  Lab Results:  Recent Labs  09/05/15 0450  09/05/15 0805 09/06/15 0415  WBC 12.6*  --  15.4*  HGB 8.0* 9.2* 8.6*  HCT 23.1* 27.0* 25.2*  PLT 156  --  175   BMET:  Recent Labs  09/05/15 0450 09/05/15 0805 09/06/15 0415  NA 134* 138 138  K 3.7 3.6 3.3*  CL 103 105 102  CO2 16*  --  21*  GLUCOSE 99 92 93  BUN 36* 36* 29*  CREATININE 7.13* 7.70* 5.90*  CALCIUM 7.8*  --  8.1*    PT/INR: No results for input(s): LABPROT, INR in the last 72 hours. ABG    Component Value Date/Time   PHART 7.386 08/29/2015 0759   HCO3 25.2* 08/29/2015 0759   TCO2 19 09/05/2015 0805   ACIDBASEDEF 0.3 12/23/2014 0335   O2SAT 70.9 09/06/2015 0410   CBG (last 3)   Recent Labs  09/05/15 1931 09/05/15 2337 09/06/15 0354  GLUCAP 85 87 89    Assessment/Plan: S/P  Plan wound debridement and external coverage of exposed VAD titanium in OR under general anesthesia Monday a.m. We'll DC heparin 6 hours before surgery.   LOS: 11 days    Tharon Aquas Trigt III 09/06/2015

## 2015-09-06 NOTE — Progress Notes (Signed)
[place] for Infectious Disease    Date of Admission:  08/26/2015   Total days of antibiotics 12        Day 2 ceftriaxone        Day 10 rif        Day 10 oral vanco   ID: [name] is a 45 y.o. 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 TEE on 5/11 negative Active Problems:   Left ventricular assist device (LVAD) complication   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HCC)   Abscess of abdominal wall   Acute on chronic systolic (congestive) heart failure (HCC)   Enteritis due to Clostridium difficile   Staphylococcus aureus bacteremia   Septic shock (HCC)    Subjective: Feeling better, still oozing from right HD site. Still have significant diarrhea  24hr event = went to OR for repeat debridement of abd wound in preparation for muscle flap and HD line placed  Medications:  . allopurinol  300 mg Oral Daily  . amiodarone  100 mg Oral Daily  . cefTRIAXone (ROCEPHIN)  IV  2 g Intravenous Q24H  . citalopram  20 mg Oral Daily  . docusate sodium  200 mg Oral Daily  . feeding supplement (NEPRO CARB STEADY)  237 mL Per Tube TID WC  . feeding supplement (PRO-STAT SUGAR FREE 64)  30 mL Per Tube TID  . ferrous sulfate  325 mg Oral BID WC  . guaiFENesin  600 mg Oral BID  . metoCLOPramide (REGLAN) injection  5 mg Intravenous Q6H  . pantoprazole  40 mg Oral Daily  . pregabalin  75 mg Oral BID  . rifampin  300 mg Oral Q8H  . simethicone  80 mg Oral QID  . sodium chloride flush  10-40 mL Intracatheter Q12H  . sodium chloride flush  3 mL Intravenous Q12H  . tamsulosin  0.4 mg Oral Daily  . vancomycin  125 mg Oral QID    Objective: Vital signs in last 24 hours: Temp:  [97.8 F (36.6 C)-98.6 F (37 C)] 98.3 F (36.8 C) (05/19 1200) Pulse Rate:  [73-89] 81 (05/19 1300) Resp:  [16-28] 20 (05/19  1300) BP: (128-141)/(58-86) 134/78 mmHg (05/18 2200) SpO2:  [95 %-100 %] 100 % (05/19 1300) Arterial Line BP: (103-147)/(64-89) 138/89 mmHg (05/19 1300) Weight:  [198 lb 3.1 oz (89.9 kg)-209 lb 7 oz (95 kg)] 198 lb 3.1 oz (89.9 kg) (05/19 0600) Physical Exam  Constitutional: He is sleeping. Chronically ill. No distress.  HENT: scleral icterus, with edematous conjunctiva, ng in place Chest wall: right McGregor,bleeding around dressing Cardiovascular: +LVAD motor heard throughout precordium Pulmonary/Chest: Effort normal and breath sounds normal. No respiratory distress. He has no wheezes.  Abdominal: Soft. Dressing in place Ext: warm Skin: Skin is warm and dry. No rash noted. No erythema.     Lab Results  Recent Labs  09/05/15 0450 09/05/15 0805 09/06/15 0415  WBC 12.6*  --  15.4*  HGB 8.0* 9.2* 8.6*  HCT 23.1* 27.0* 25.2*  NA 134* 138 138  K 3.7 3.6 3.3*  CL 103 105 102  CO2 16*  --  21*  BUN 36* 36* 29*  CREATININE 7.13* 7.70* 5.90*   Liver Panel  Recent Labs  09/05/15 0450 09/05/15 1827 09/06/15 0415  PROT 5.9*  --  5.8*  ALBUMIN 1.7*  --  1.7*  AST 41  --  46*  ALT 20 18 17   ALKPHOS 96  --  109  BILITOT 8.9*  --  9.1*    Microbiology: 5/15 trach aspirate cx: few GNR 5/9 wound cx; MSSA Studies/Results: Dg Chest Port 1 View  09/06/2015  CLINICAL DATA:  Irrigation and debridement of a sternal abscess, insertion of a dialysis catheter; patient has a left ventricular assist device EXAM: PORTABLE CHEST 1 VIEW COMPARISON:  Portable chest x-ray of Sep 05, 2015. FINDINGS: The lungs are reasonably well inflated. The interstitial markings remain increased. There is atelectasis or infiltrate in the right infrahilar region and in the retrocardiac region on the left. The a cardiac silhouette remains enlarged. The left ventricular assist device is partially included in the field of view. The permanent pacemaker defibrillator is in stable position. A left atrial appendage clamp is  present. The feeding tube tip projects below the inferior margin of the image. The dual-lumen dialysis catheter tip projects over the midportion of the SVC. The PICC line tip lies at approximately the same level. IMPRESSION: Stable appearance of the chest since yesterday's study. Persistent bibasilar atelectasis or pneumonia with pulmonary interstitial edema and moderate cardiomegaly. The support apparatus appears stable. Electronically Signed   By: David  Martinique M.D.   On: 09/06/2015 07:50   Dg Chest Port 1 View  09/05/2015  CLINICAL DATA:  Right-sided central venous line placement. EXAM: PORTABLE CHEST 1 VIEW COMPARISON:  09/04/2015 FINDINGS: There has been interval placement of dual lumen right subclavian approach central venous catheter with tip terminating at the expected location of cavoatrial junction. Right PICC line, enteric catheter, dual lead cardiac pacemaker and ventricular assist device are stable. Cardiomediastinal silhouette is enlarged. There is diffuse haziness of the lungs which may be seen with pulmonary edema. No large pleural effusions. Osseous structures are without acute abnormality. Median sternotomy wires are in place. Soft tissues are grossly normal. IMPRESSION: Interval placement of right subclavian approach central venous catheter with tip at the expected location of the cavoatrial junction. Stable remaining support apparatus. Persistent cardiomegaly. Probable pulmonary edema. Electronically Signed   By: Fidela Salisbury M.D.   On: 09/05/2015 16:52   Dg Fluoro Guide Cv Line-no Report  09/05/2015  CLINICAL DATA:  FLOURO GUIDE CV LINE Fluoroscopy was utilized by the requesting physician.  No radiographic interpretation.     Assessment/Plan: 45yo M with MSSA LVAD drive line and abdominal wall infection complicated with c.difficile, oligouric AKI, hypotension with pressors, aspirate pneumonia, and AKI requiring CRRT POD # 1 for debridement today, plan for flap repair on  5/22  Aspirate pneumonia = currently on ceftriaxone 2gm IV daily. Currently on day 5 of 7 for aspiration kleb pna. Will plan to change back cefazolin on 5/22  Worsening AKI = now requiring CRRT.   vanco is not systemically absorbed from the gi tract  cdifficile infection= continue with oral vanco125mg  Q6 by mouth  MSSA LVAD drive line/abdominal wall infection = ceftriaxone will cover MSSA for the time being. We will change back to cefazolin on 5/22., continue with rifampin but may consider doing only 6 wk in the acute setting  [place], [facility] for Infectious Diseases Cell: [phone] Pager: [phone]  09/06/2015, 2:03 PM

## 2015-09-06 NOTE — Progress Notes (Signed)
ANTICOAGULATION CONSULT NOTE - Follow Up Consult  Pharmacy Consult for Heparin Indication: LvAD  Allergies  Allergen Reactions  . Phytonadione Other (See Comments)    Patient has LVAD: please check with LVAD coordinator on call or LVAD MD on call before reversal of anticoagulation with vit k    Patient Measurements: Height: 5\' 7"  (170.2 cm) Weight: 198 lb 3.1 oz (89.9 kg) IBW/kg (Calculated) : 66.1 Heparin Dosing Weight:  85 kg  Vital Signs: Temp: 98.5 F (36.9 C) (05/19 1948) Temp Source: Oral (05/19 1948) Pulse Rate: 85 (05/19 2000)  Labs:  Recent Labs  09/04/15 1215  09/05/15 0450 09/05/15 0805 09/06/15 0415 09/06/15 1735 09/06/15 2005  HGB 8.5*  --  8.0* 9.2* 8.6*  --   --   HCT 24.3*  --  23.1* 27.0* 25.2*  --   --   PLT 171  --  156  --  175  --   --   HEPARINUNFRC  --   --   --   --   --  1.00* <0.10*  CREATININE  --   < > 7.13* 7.70* 5.90*  --   --   < > = values in this interval not displayed.  Estimated Creatinine Clearance: 16.6 mL/min (by C-G formula based on Cr of 5.9).  Assessment: 45yom on coumadin pta for his LVAD, admitted with driveline abscess, sternal wound infection, and MSSA bacteremia. Home Coumadin held, and heparin bridge started 5/10 pending OR trips for wound debridement.   Spoke with Dr. Eulis Manly tonight about HL<0.1; he reported rate may be changed if previous bleeding had stabilized. After checking with RN who reports patient is still bleeding a lot all over and requiring frequent dressing changes due to saturation, I will not change heparin rate. Left VM for MD to call pharmacy or RN Jenny Reichmann if he would like to DEcrease rate at all.  Goal of Therapy:  HL 0.2-0.3 Monitor platelets by anticoagulation protocol: Yes   Plan:  Continue IV heparin at 1000 units/hr for now HL and CBC in AM   Monserrath Junio S. Alford Highland, PharmD, BCPS Clinical Staff Pharmacist Pager [phone]  Eilene Ghazi Stillinger 09/06/2015,10:15 PM

## 2015-09-06 NOTE — Progress Notes (Signed)
Patient ID: [name], male   DOB: 1970/09/25, 45 y.o.   MRN: [id number]    HeartMate 2 Rounding Note  Subjective:    Admitted from [facility] in [place] with driveline abscess. Blood cultures --> MSSA and has C diff. Remains on Nafcillin  and oral vancomycin.   08/27/15 S/P I &D Epigastric with hardware exposed and VAC placement.  5/11 repeat I&D and wound vac replacement.  5/11 TEE no vegetations on valves or ICD wire   Overall he has received 7 UPRBCs.   Yesterday he returned to the OR with Plastic Surgery. ACell and VAC applied. Over night VAC stopped due to clots. Hgb back 8.6 . Tube feeds restarted.    LDH 177>212>231>244>263   LVAD INTERROGATION:  HeartMate II LVAD:  Flow 4.5 liters/min, speed 9200, power 5.0  PI 7.9  No PI events.   Objective:    Vital Signs:   Temp:  [97.8 F (36.6 C)-98.7 F (37.1 C)] 98.2 F (36.8 C) (05/19 0400) Pulse Rate:  [73-89] 84 (05/19 0700) Resp:  [15-31] 23 (05/19 0700) BP: (128-141)/(58-86) 134/78 mmHg (05/18 2200) SpO2:  [90 %-100 %] 100 % (05/19 0700) Arterial Line BP: (103-151)/(64-91) 132/80 mmHg (05/19 0700) Weight:  [198 lb 3.1 oz (89.9 kg)-209 lb 7 oz (95 kg)] 198 lb 3.1 oz (89.9 kg) (05/19 0600) Last BM Date: 09/05/15 Mean arterial Pressure  80-90s    Intake/Output:   Intake/Output Summary (Last 24 hours) at 09/06/15 0738 Last data filed at 09/06/15 0700  Gross per 24 hour  Intake 2795.59 ml  Output   3273 ml  Net -477.41 ml     Physical Exam: CVP 14.  General: In bed. Very weak.  HEENT: Sceleral icterus Neck: supple.  JVP to jaw. Carotids 2+ bilat; no bruits. No lymphadenopathy or thryomegaly appreciated. Cor: Mechanical heart sounds with LVAD hum present. Lungs: clear  Abdomen: soft, tender, distended. No hepatosplenomegaly. No bruits or masses. Good bowel sounds. Mid epigastric VAC with blood clots under dressing.   Driveline: C/D/I; securement device intact and driveline  incorporated Extremities: no cyanosis, clubbing, rash, 1+ edema R and LLE SCDs.  Neuro: alert & orientedx3, cranial nerves grossly intact. moves all 4 extremities w/o difficulty.  Telemetry: NSR 70s   Labs: Basic Metabolic Panel:  Recent Labs Lab 09/03/15 0431 09/03/15 1300 09/04/15 0411 09/04/15 1512 09/05/15 0450 09/05/15 0805 09/06/15 0415  NA 134* 132* 134* 135 134* 138 138  K 4.0 3.9 3.8 3.6 3.7 3.6 3.3*  CL 102 102 104 106 103 105 102  CO2 18* 18* 16* 15* 16*  --  21*  GLUCOSE 107* 111* 104* 106* 99 92 93  BUN 17 20 27* 32* 36* 36* 29*  CREATININE 3.89* 4.68* 5.57* 6.10* 7.13* 7.70* 5.90*  CALCIUM 7.5* 7.3* 7.4* 7.8* 7.8*  --  8.1*  MG 1.8  --   --   --   --   --   --   PHOS 6.7*  --   --   --   --   --   --     Liver Function Tests:  Recent Labs Lab 09/02/15 0955 09/03/15 0431 09/04/15 0411 09/05/15 0450 09/05/15 1827 09/06/15 0415  AST 37 39 46* 41  --  46*  ALT 31 27 31 20 18 17   ALKPHOS 89 96 99 96  --  109  BILITOT 9.2* 11.4* 9.4* 8.9*  --  9.1*  PROT 5.5* 6.0* 5.9* 5.9*  --  5.8*  ALBUMIN  2.1* 2.1* 1.8* 1.7*  --  1.7*   No results for input(s): LIPASE, AMYLASE in the last 168 hours.  Recent Labs Lab 09/02/15 1045  AMMONIA 40*    CBC:  Recent Labs Lab 09/03/15 0431  09/03/15 2055 09/04/15 0411 09/04/15 1215 09/05/15 0450 09/05/15 0805 09/06/15 0415  WBC 20.0*  < > 19.9* 13.8* 15.6* 12.6*  --  15.4*  NEUTROABS 17.8*  --   --   --   --   --   --   --   HGB 8.5*  < > 8.6* 7.9* 8.5* 8.0* 9.2* 8.6*  HCT 23.8*  < > 24.5* 23.0* 24.3* 23.1* 27.0* 25.2*  MCV 69.0*  < > 69.2* 69.9* 69.4* 70.4*  --  72.4*  PLT 286  < > 213 212 171 156  --  175  < > = values in this interval not displayed.  INR:  Recent Labs Lab 09/01/15 0451  INR 1.54*    Other results:    Imaging: Dg Chest Port 1 View  09/05/2015  CLINICAL DATA:  Right-sided central venous line placement. EXAM: PORTABLE CHEST 1 VIEW COMPARISON:  09/04/2015 FINDINGS: There has  been interval placement of dual lumen right subclavian approach central venous catheter with tip terminating at the expected location of cavoatrial junction. Right PICC line, enteric catheter, dual lead cardiac pacemaker and ventricular assist device are stable. Cardiomediastinal silhouette is enlarged. There is diffuse haziness of the lungs which may be seen with pulmonary edema. No large pleural effusions. Osseous structures are without acute abnormality. Median sternotomy wires are in place. Soft tissues are grossly normal. IMPRESSION: Interval placement of right subclavian approach central venous catheter with tip at the expected location of the cavoatrial junction. Stable remaining support apparatus. Persistent cardiomegaly. Probable pulmonary edema. Electronically Signed   By: Fidela Salisbury M.D.   On: 09/05/2015 16:52   Dg Fluoro Guide Cv Line-no Report  09/05/2015  CLINICAL DATA:  FLOURO GUIDE CV LINE Fluoroscopy was utilized by the requesting physician.  No radiographic interpretation.     Medications:     Scheduled Medications: . allopurinol  300 mg Oral Daily  . amiodarone  100 mg Oral Daily  . cefTRIAXone (ROCEPHIN)  IV  2 g Intravenous Q24H  . citalopram  20 mg Oral Daily  . docusate sodium  200 mg Oral Daily  . feeding supplement (NEPRO CARB STEADY)  237 mL Per Tube TID WC  . feeding supplement (PRO-STAT SUGAR FREE 64)  30 mL Per Tube TID  . ferrous sulfate  325 mg Oral BID WC  . guaiFENesin  600 mg Oral BID  . metoCLOPramide (REGLAN) injection  5 mg Intravenous Q6H  . pantoprazole  40 mg Oral Daily  . pregabalin  75 mg Oral BID  . rifampin  300 mg Oral Q8H  . simethicone  80 mg Oral QID  . sodium chloride flush  10-40 mL Intracatheter Q12H  . sodium chloride flush  3 mL Intravenous Q12H  . tamsulosin  0.4 mg Oral Daily  . vancomycin  125 mg Oral QID    Infusions: . dextrose 5 % and 0.9% NaCl 50 mL/hr at 09/06/15 0700  . feeding supplement (NEPRO CARB STEADY) 1,000  mL (09/05/15 1804)  . norepinephrine (LEVOPHED) Adult infusion Stopped (09/05/15 1553)  . vasopressin (PITRESSIN) infusion - *FOR SHOCK* Stopped (09/05/15 2200)    PRN Medications: sodium chloride, sodium chloride, sodium chloride, alteplase, bisacodyl **OR** bisacodyl, fentaNYL (SUBLIMAZE) injection, heparin, ondansetron, oxyCODONE, sodium chloride flush, sodium  chloride flush, traMADol   Assessment:   1. LVAD Complication- Driveline abscess 2. MSSA bacteremia -> septic shock 3. Acute/Chronic systolic HF s/p VAD placement 9/16 4. C. Difficile colitis 5. 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 Microcytic anemia 8. Urinary retention 9. Hypokalelmia 10. HTN 11. Anemia 12. Vomiting.  13. Sever Malnutrition.   58. ARF  Plan/Discussion:    S/P I&D LVAD driveline abscess. Exposed hardware. Returned to the OR 0000000 with ACell application and VAD. Unfortunately VAC stopped due to clots. I remove 2 sutures and VAC sponge. Ongoing ooze noted. Contact layer maintained. Dry 4x4 reapplied. No Surgilube due moderate bloody exudate.   ID following. Continue po Vanc for C diff, imipenem, and Rifampin. All blood cultures NGTD. Underwent deeper debridement and wound vac replacement 5/11. Abd abscess with few staphylococcus aureus. No vegetations on TEE.  Maps trending up. Off norepi and vasopressin. Tolerated HD.     WBC 15 On vanc, imipenum, and rifampin. ESBL sputum and MSSA blood.   C. Diff- Still having loose stools. Continue oral vanc.   Overall he has received 7 UPRBCs. Hgb 8.6. LDH trending up. Restart heparin today. Continue SCDS.   Prealbumin 8.8 . Nutrition following.   Consult PT/OT.   VAD parameters stable.   I reviewed the LVAD parameters from today, and compared the results to the patient's prior recorded data.  No programming changes were made.  The LVAD is functioning within specified parameters.  The patient performs LVAD self-test daily.   LVAD interrogation was negative for any significant power changes, alarms or PI events/speed drops.  LVAD equipment check completed and is in good working order.  Back-up equipment present.   LVAD education done on emergency procedures and precautions and reviewed exit site care.  Length of Stay: Kiel NP-C  09/06/2015, 7:38 AM  VAD Team --- VAD ISSUES ONLY--- Pager [phone] (7am - 7am)  Advanced Heart Failure Team  Pager [phone] (M-F; 7a - 4p)  Please contact [facility] for night-coverage after hours (4p -7a ) and weekends on [email or web address]   Patient seen and examined with Darrick Grinder, NP. We discussed all aspects of the encounter. I agree with the assessment and plan as stated above.   Remains anuric. Tolerated HD well today. Still markedly volume overloaded. Co-ox 70%. Now off pressors. MAP ok. VAD parameters ok.    Wound with extensive clot. Wound vac out due to clot. Continue wound care. Will need to go back to OR to cover VAD.   HGb remains low. Continue to transfuse as needed. Now on low dose heparin due to rising LDH.   Continue broad spectrum abx and TFs.   Begin to mobilize as tolerated.   Remains critically ill.    The patient is critically ill with multiple organ systems failure and requires high complexity decision making for assessment and support, frequent evaluation and titration of therapies, application of advanced monitoring technologies and extensive interpretation of multiple databases.   Critical Care Time devoted to patient care services described in this note is 35 Minutes.   Mahala Rommel,MD 5:58 PM

## 2015-09-07 ENCOUNTER — Inpatient Hospital Stay (HOSPITAL_COMMUNITY): Payer: 59

## 2015-09-07 DIAGNOSIS — T826XXA Infection and inflammatory reaction due to cardiac valve prosthesis, initial encounter: Secondary | ICD-10-CM

## 2015-09-07 DIAGNOSIS — J150 Pneumonia due to Klebsiella pneumoniae: Secondary | ICD-10-CM

## 2015-09-07 DIAGNOSIS — Y849 Medical procedure, unspecified as the cause of abnormal reaction of the patient, or of later complication, without mention of misadventure at the time of the procedure: Secondary | ICD-10-CM

## 2015-09-07 DIAGNOSIS — B9689 Other specified bacterial agents as the cause of diseases classified elsewhere: Secondary | ICD-10-CM

## 2015-09-07 LAB — CBC
HCT: 20.9 % — ABNORMAL LOW (ref 39.0–52.0)
HEMATOCRIT: 25.2 % — AB (ref 39.0–52.0)
HEMOGLOBIN: 8.6 g/dL — AB (ref 13.0–17.0)
Hemoglobin: 7.1 g/dL — ABNORMAL LOW (ref 13.0–17.0)
MCH: 24.3 pg — ABNORMAL LOW (ref 26.0–34.0)
MCH: 25.9 pg — AB (ref 26.0–34.0)
MCHC: 34 g/dL (ref 30.0–36.0)
MCHC: 34.1 g/dL (ref 30.0–36.0)
MCV: 71.6 fL — ABNORMAL LOW (ref 78.0–100.0)
MCV: 75.9 fL — AB (ref 78.0–100.0)
Platelets: 181 10*3/uL (ref 150–400)
Platelets: 157 10*3/uL (ref 150–400)
RBC: 2.92 MIL/uL — ABNORMAL LOW (ref 4.22–5.81)
RBC: 3.32 MIL/uL — AB (ref 4.22–5.81)
RDW: 25.6 % — ABNORMAL HIGH (ref 11.5–15.5)
RDW: 24.3 % — ABNORMAL HIGH (ref 11.5–15.5)
WBC: 13.3 10*3/uL — ABNORMAL HIGH (ref 4.0–10.5)
WBC: 14.9 10*3/uL — ABNORMAL HIGH (ref 4.0–10.5)

## 2015-09-07 LAB — COMPREHENSIVE METABOLIC PANEL
ALBUMIN: 1.5 g/dL — AB (ref 3.5–5.0)
ALT: 8 U/L — ABNORMAL LOW (ref 17–63)
ANION GAP: 16 — AB (ref 5–15)
AST: 42 U/L — AB (ref 15–41)
Alkaline Phosphatase: 92 U/L (ref 38–126)
BILIRUBIN TOTAL: 8.5 mg/dL — AB (ref 0.3–1.2)
BUN: 44 mg/dL — AB (ref 6–20)
CHLORIDE: 103 mmol/L (ref 101–111)
CO2: 20 mmol/L — AB (ref 22–32)
Calcium: 8.4 mg/dL — ABNORMAL LOW (ref 8.9–10.3)
Creatinine, Ser: 7.49 mg/dL — ABNORMAL HIGH (ref 0.61–1.24)
GFR calc Af Amer: 9 mL/min — ABNORMAL LOW (ref >60)
GFR calc non Af Amer: 8 mL/min — ABNORMAL LOW (ref >60)
GLUCOSE: 82 mg/dL (ref 65–99)
POTASSIUM: 3.3 mmol/L — AB (ref 3.5–5.1)
SODIUM: 139 mmol/L (ref 135–145)
TOTAL PROTEIN: 5.7 g/dL — AB (ref 6.5–8.1)

## 2015-09-07 LAB — GLUCOSE, CAPILLARY
GLUCOSE-CAPILLARY: 87 mg/dL (ref 65–99)
Glucose-Capillary: 73 mg/dL (ref 65–99)
Glucose-Capillary: 75 mg/dL (ref 65–99)
Glucose-Capillary: 126 mg/dL — ABNORMAL HIGH (ref 65–99)
Glucose-Capillary: 103 mg/dL — ABNORMAL HIGH (ref 65–99)

## 2015-09-07 LAB — CARBOXYHEMOGLOBIN
CARBOXYHEMOGLOBIN: 1.5 % (ref 0.5–1.5)
METHEMOGLOBIN: 1.3 % (ref 0.0–1.5)
O2 Saturation: 68.2 %
Total hemoglobin: 10.3 g/dL — ABNORMAL LOW (ref 13.5–18.0)

## 2015-09-07 LAB — CULTURE, BLOOD (SINGLE): Culture: NO GROWTH

## 2015-09-07 LAB — HEPATITIS B CORE ANTIBODY, TOTAL: Hep B Core Total Ab: NEGATIVE

## 2015-09-07 LAB — HEPATITIS B SURFACE ANTIGEN: HEP B S AG: NEGATIVE

## 2015-09-07 LAB — HEPARIN LEVEL (UNFRACTIONATED): Heparin Unfractionated: <0.1 IU/mL — ABNORMAL LOW (ref 0.30–0.70)

## 2015-09-07 LAB — PREPARE RBC (CROSSMATCH)

## 2015-09-07 LAB — LACTATE DEHYDROGENASE: LDH: 248 U/L — AB (ref 98–192)

## 2015-09-07 LAB — HEPATITIS B SURFACE ANTIBODY,QUALITATIVE: Hep B S Ab: NONREACTIVE

## 2015-09-07 MED ORDER — GELATIN ABSORBABLE 12-7 MM EX MISC
2.0000 | Freq: Two times a day (BID) | CUTANEOUS | Status: DC | PRN
  Filled 2015-09-07: qty 2

## 2015-09-07 MED ORDER — SODIUM CHLORIDE 0.9 % IV SOLN: Freq: Once | INTRAVENOUS | Status: DC

## 2015-09-07 MED ORDER — NEPRO/CARBSTEADY PO LIQD
1000.0000 mL | ORAL | Status: DC
  Administered 2015-09-08 – 2015-09-10 (×3): 1000 mL
  Filled 2015-09-07 (×6): qty 1000

## 2015-09-07 NOTE — Procedures (Signed)
I was present at this dialysis session. I have reviewed the session itself and made appropriate changes.   UOP 170 yesterday No lab evidence of sufficient GFR to avoid HD K 3.3 on 4K bath Hb 7.1, to rec 2u PRBC with HD today Afebrile. BP ok remains off pressors   Recent Labs Lab 09/03/15 0431  09/07/15 0400  NA 134*  < > 139  K 4.0  < > 3.3*  CL 102  < > 103  CO2 18*  < > 20*  GLUCOSE 107*  < > 82  BUN 17  < > 44*  CREATININE 3.89*  < > 7.49*  CALCIUM 7.5*  < > 8.4*  PHOS 6.7*  --   --   < > = values in this interval not displayed.   Recent Labs Lab 09/03/15 0431  09/05/15 0450 09/05/15 0805 09/06/15 0415 09/07/15 0400  WBC 20.0*  < > 12.6*  --  15.4* 13.3*  NEUTROABS 17.8*  --   --   --   --   --   HGB 8.5*  < > 8.0* 9.2* 8.6* 7.1*  HCT 23.8*  < > 23.1* 27.0* 25.2* 20.9*  MCV 69.0*  < > 70.4*  --  72.4* 71.6*  PLT 286  < > 156  --  175 181  < > = values in this interval not displayed.  Scheduled Meds: . sodium chloride   Intravenous Once  . sodium chloride   Intravenous Once  . allopurinol  300 mg Oral Daily  . amiodarone  100 mg Oral Daily  . cefTRIAXone (ROCEPHIN)  IV  2 g Intravenous Q24H  . citalopram  20 mg Oral Daily  . docusate sodium  200 mg Oral Daily  . feeding supplement (NEPRO CARB STEADY)  1,000 mL Per Tube Q24H  . feeding supplement (NEPRO CARB STEADY)  237 mL Per Tube TID WC  . feeding supplement (PRO-STAT SUGAR FREE 64)  30 mL Per Tube TID  . ferrous sulfate  325 mg Oral BID WC  . guaiFENesin  600 mg Oral BID  . metoCLOPramide (REGLAN) injection  5 mg Intravenous Q6H  . pantoprazole  40 mg Oral Daily  . pregabalin  75 mg Oral BID  . rifampin  300 mg Oral Q8H  . simethicone  80 mg Oral QID  . sodium chloride flush  10-40 mL Intracatheter Q12H  . sodium chloride flush  3 mL Intravenous Q12H  . tamsulosin  0.4 mg Oral Daily  . vancomycin  125 mg Oral QID   Continuous Infusions: . heparin 1,000 Units/hr (09/07/15 0756)  . norepinephrine  (LEVOPHED) Adult infusion Stopped (09/05/15 1553)  . vasopressin (PITRESSIN) infusion - *FOR SHOCK* Stopped (09/05/15 2200)   PRN Meds:.sodium chloride, sodium chloride, sodium chloride, alteplase, bisacodyl **OR** bisacodyl, fentaNYL (SUBLIMAZE) injection, gelatin adsorbable, heparin, ondansetron, oxyCODONE, sodium chloride flush, sodium chloride flush, traMADol   Pearson Grippe  MD 09/07/2015, 7:57 AM

## 2015-09-07 NOTE — Progress Notes (Signed)
Patient ID: [name], male   DOB: [birth year], 45 y.o.   MRN: [id number]  SICU Evening Rounds:  Hemodynamically stable. Pump parameters good with PI 7.2, flow 5.2  Tolerated HD today.  Transfuse 2 units prbc's this am on HD.  Still some oozing from wound and around HD catheter. Continuing heparin at 1000 units per hr.  Will check CBC tonight

## 2015-09-07 NOTE — Progress Notes (Signed)
[name] for Infectious Disease   Reason for visit: Follow up on drive line infection, C diff  Interval History: he reports a little less diarrhea and a bit more formed.  Afebrile.  Repeat blood culture from 5/15 negative, final.  He denies any pain.  On CRRT.  WBC down to 13.3.    Physical Exam: Constitutional:  Filed Vitals:   09/07/15 1035 09/07/15 1043  BP: 122/81   Pulse: 87 86  Temp: 98.4 F (36.9 C)   Resp: 23 23   patient appears in NAD Respiratory: Normal respiratory effort; CTA B anterior exam GI: soft, nt MS: no edema Skin: no rashes   Review of Systems: Respiratory: negative for cough Integument/breast: negative for rash  Lab Results  Component Value Date   WBC 13.3* 09/07/2015   HGB 7.1* 09/07/2015   HCT 20.9* 09/07/2015   MCV 71.6* 09/07/2015   PLT 181 09/07/2015    Lab Results  Component Value Date   CREATININE 7.49* 09/07/2015   BUN 44* 09/07/2015   NA 139 09/07/2015   K 3.3* 09/07/2015   CL 103 09/07/2015   CO2 20* 09/07/2015    Lab Results  Component Value Date   ALT 8* 09/07/2015   AST 42* 09/07/2015   ALKPHOS 92 09/07/2015     Microbiology: Recent Results (from the past 240 hour(s))  Culture, respiratory (NON-Expectorated)     Status: None   Collection Time: 09/02/15  8:30 AM  Result Value Ref Range Status   Specimen Description TRACHEAL ASPIRATE  Final   Special Requests Normal  Final   Gram Stain   Final    MODERATE WBC PRESENT,BOTH PMN AND MONONUCLEAR FEW SQUAMOUS EPITHELIAL CELLS PRESENT FEW GRAM NEGATIVE RODS Performed at [facility]    Culture   Final    MODERATE KLEBSIELLA PNEUMONIAE Performed at [facility]    Report Status 09/04/2015 FINAL  Final   Organism ID, Bacteria KLEBSIELLA PNEUMONIAE  Final      Susceptibility   Klebsiella pneumoniae - MIC*    AMPICILLIN >=32 RESISTANT Resistant     AMPICILLIN/SULBACTAM 16 INTERMEDIATE Intermediate     CEFEPIME <=1 SENSITIVE Sensitive    CEFTAZIDIME <=1 SENSITIVE Sensitive     CEFTRIAXONE <=1 SENSITIVE Sensitive     CIPROFLOXACIN <=0.25 SENSITIVE Sensitive     GENTAMICIN <=1 SENSITIVE Sensitive     IMIPENEM <=0.25 SENSITIVE Sensitive     PIP/TAZO 16 SENSITIVE Sensitive     TOBRAMYCIN <=1 SENSITIVE Sensitive     TRIMETH/SULFA Value in next row Sensitive      <=20 SENSITIVE(NOTE)    * MODERATE KLEBSIELLA PNEUMONIAE  Culture, blood (single) w Reflex to ID Panel     Status: None   Collection Time: 09/02/15 10:20 AM  Result Value Ref Range Status   Specimen Description BLOOD LEFT HAND  Final   Special Requests IN PEDIATRIC BOTTLE 1CC  Final   Culture NO GROWTH 5 DAYS  Final   Report Status 09/07/2015 FINAL  Final  Urine culture     Status: Abnormal   Collection Time: 09/02/15  5:30 PM  Result Value Ref Range Status   Specimen Description URINE, CLEAN CATCH  Final   Special Requests NONE  Final   Culture MULTIPLE SPECIES PRESENT, SUGGEST RECOLLECTION (A)  Final   Report Status 09/04/2015 FINAL  Final    Impression/Plan:  1. Drive line infection - on ceftriaxone and rifampin.  Cefazolin on Monday 2. Aspiration pneumonia - on ceftriaxone, day  6/7.   3.  C diff - seems to be improving on vancomycin, will continue.

## 2015-09-07 NOTE — Progress Notes (Signed)
2Rehab Admissions Coordinator Note:  Patient was screened by Retta Diones for appropriateness for an Inpatient Acute Rehab Consult.  At this time, we are recommending to wait and see how patient tolerates therapies.  Also noted procedure scheduled for Monday.  Need to see if patient can tolerate therapies post op.  Call me for questions.Karl Bales, Lavonna Monarch M 09/07/2015, 12:50 PM  I can be reached at [phone].

## 2015-09-07 NOTE — Progress Notes (Signed)
Patient ID: [name], male   DOB: 05-19-1970, 45 y.o.   MRN: [id number]    HeartMate 2 Rounding Note  Subjective:    Admitted from [facility] in [place] with driveline abscess. Blood cultures --> MSSA and has C diff. Remains on Nafcillin  and oral vancomycin.   08/27/15 S/P I &D Epigastric with hardware exposed and VAC placement.  5/11 repeat I&D and wound vac replacement.  5/11 TEE no vegetations on valves or ICD wire   Feels better today. Was oozing from HD cath yesterday. Now better. Still with some loose stools. MAPs improved. On HD now. CVP 7.  On heparin at 1,000 but level low. Hgb down to 7.1. Co-ox 68%  LDH 177>212>231>244>263   LVAD INTERROGATION:  HeartMate II LVAD:  Flow 5.2 liters/min, speed 9200, power 5.2  PI 6.8  No PI events.   Objective:    Vital Signs:   Temp:  [98.3 F (36.8 C)-98.9 F (37.2 C)] 98.5 F (36.9 C) (05/20 0945) Pulse Rate:  [81-89] 86 (05/20 1000) Resp:  [17-36] 20 (05/20 1000) BP: (101-114)/(60-75) 110/75 mmHg (05/20 1000) SpO2:  [98 %-100 %] 100 % (05/20 1000) Arterial Line BP: (98-138)/(66-89) 108/74 mmHg (05/20 0945) Weight:  [88.8 kg (195 lb 12.3 oz)] 88.8 kg (195 lb 12.3 oz) (05/20 0700) Last BM Date: 09/06/15 Mean arterial Pressure  80-90s    Intake/Output:   Intake/Output Summary (Last 24 hours) at 09/07/15 1017 Last data filed at 09/07/15 0945  Gross per 24 hour  Intake   2339 ml  Output    160 ml  Net   2179 ml     Physical Exam: CVP 7 General: In bed. Very weak. Falls asleep in mid converstation HEENT: Scleral icterus Neck: supple.  JVP 8 Carotids 2+ bilat; no bruits. No lymphadenopathy or thryomegaly appreciated. R subclavian trialysis cath dressing dry Cor: Mechanical heart sounds with LVAD hum present. Lungs: clear  Abdomen: soft, tender, distended. Good bowel sounds. Mid epigastric dressing.   Driveline: C/D/I; securement device intact and driveline incorporated Extremities: no cyanosis,  clubbing, rash, 1+ edema R and LLE SCDs.  Neuro: alert & orientedx3, cranial nerves grossly intact. moves all 4 extremities w/o difficulty.  Telemetry: NSR 70s   Labs: Basic Metabolic Panel:  Recent Labs Lab 09/03/15 0431  09/04/15 0411 09/04/15 1512 09/05/15 0450 09/05/15 0805 09/06/15 0415 09/07/15 0400  NA 134*  < > 134* 135 134* 138 138 139  K 4.0  < > 3.8 3.6 3.7 3.6 3.3* 3.3*  CL 102  < > 104 106 103 105 102 103  CO2 18*  < > 16* 15* 16*  --  21* 20*  GLUCOSE 107*  < > 104* 106* 99 92 93 82  BUN 17  < > 27* 32* 36* 36* 29* 44*  CREATININE 3.89*  < > 5.57* 6.10* 7.13* 7.70* 5.90* 7.49*  CALCIUM 7.5*  < > 7.4* 7.8* 7.8*  --  8.1* 8.4*  MG 1.8  --   --   --   --   --   --   --   PHOS 6.7*  --   --   --   --   --   --   --   < > = values in this interval not displayed.  Liver Function Tests:  Recent Labs Lab 09/03/15 0431 09/04/15 0411 09/05/15 0450 09/05/15 1827 09/06/15 0415 09/07/15 0400  AST 39 46* 41  --  46* 42*  ALT 27 31  20 18 17  8*  ALKPHOS 96 99 96  --  109 92  BILITOT 11.4* 9.4* 8.9*  --  9.1* 8.5*  PROT 6.0* 5.9* 5.9*  --  5.8* 5.7*  ALBUMIN 2.1* 1.8* 1.7*  --  1.7* 1.5*   No results for input(s): LIPASE, AMYLASE in the last 168 hours.  Recent Labs Lab 09/02/15 1045  AMMONIA 40*    CBC:  Recent Labs Lab 09/03/15 0431  09/04/15 0411 09/04/15 1215 09/05/15 0450 09/05/15 0805 09/06/15 0415 09/07/15 0400  WBC 20.0*  < > 13.8* 15.6* 12.6*  --  15.4* 13.3*  NEUTROABS 17.8*  --   --   --   --   --   --   --   HGB 8.5*  < > 7.9* 8.5* 8.0* 9.2* 8.6* 7.1*  HCT 23.8*  < > 23.0* 24.3* 23.1* 27.0* 25.2* 20.9*  MCV 69.0*  < > 69.9* 69.4* 70.4*  --  72.4* 71.6*  PLT 286  < > 212 171 156  --  175 181  < > = values in this interval not displayed.  INR:  Recent Labs Lab 09/01/15 0451  INR 1.54*    Other results:    Imaging: Dg Chest Port 1 View  09/06/2015  CLINICAL DATA:  Irrigation and debridement of a sternal abscess, insertion of  a dialysis catheter; patient has a left ventricular assist device EXAM: PORTABLE CHEST 1 VIEW COMPARISON:  Portable chest x-ray of Sep 05, 2015. FINDINGS: The lungs are reasonably well inflated. The interstitial markings remain increased. There is atelectasis or infiltrate in the right infrahilar region and in the retrocardiac region on the left. The a cardiac silhouette remains enlarged. The left ventricular assist device is partially included in the field of view. The permanent pacemaker defibrillator is in stable position. A left atrial appendage clamp is present. The feeding tube tip projects below the inferior margin of the image. The dual-lumen dialysis catheter tip projects over the midportion of the SVC. The PICC line tip lies at approximately the same level. IMPRESSION: Stable appearance of the chest since yesterday's study. Persistent bibasilar atelectasis or pneumonia with pulmonary interstitial edema and moderate cardiomegaly. The support apparatus appears stable. Electronically Signed   By: David  Martinique M.D.   On: 09/06/2015 07:50   Dg Chest Port 1 View  09/05/2015  CLINICAL DATA:  Right-sided central venous line placement. EXAM: PORTABLE CHEST 1 VIEW COMPARISON:  09/04/2015 FINDINGS: There has been interval placement of dual lumen right subclavian approach central venous catheter with tip terminating at the expected location of cavoatrial junction. Right PICC line, enteric catheter, dual lead cardiac pacemaker and ventricular assist device are stable. Cardiomediastinal silhouette is enlarged. There is diffuse haziness of the lungs which may be seen with pulmonary edema. No large pleural effusions. Osseous structures are without acute abnormality. Median sternotomy wires are in place. Soft tissues are grossly normal. IMPRESSION: Interval placement of right subclavian approach central venous catheter with tip at the expected location of the cavoatrial junction. Stable remaining support apparatus.  Persistent cardiomegaly. Probable pulmonary edema. Electronically Signed   By: Fidela Salisbury M.D.   On: 09/05/2015 16:52   Dg Fluoro Guide Cv Line-no Report  09/05/2015  CLINICAL DATA:  FLOURO GUIDE CV LINE Fluoroscopy was utilized by the requesting physician.  No radiographic interpretation.     Medications:     Scheduled Medications: . sodium chloride   Intravenous Once  . sodium chloride   Intravenous Once  . allopurinol  300 mg Oral Daily  . amiodarone  100 mg Oral Daily  . cefTRIAXone (ROCEPHIN)  IV  2 g Intravenous Q24H  . citalopram  20 mg Oral Daily  . docusate sodium  200 mg Oral Daily  . feeding supplement (NEPRO CARB STEADY)  1,000 mL Per Tube Q24H  . feeding supplement (NEPRO CARB STEADY)  237 mL Per Tube TID WC  . feeding supplement (PRO-STAT SUGAR FREE 64)  30 mL Per Tube TID  . ferrous sulfate  325 mg Oral BID WC  . guaiFENesin  600 mg Oral BID  . metoCLOPramide (REGLAN) injection  5 mg Intravenous Q6H  . pantoprazole  40 mg Oral Daily  . pregabalin  75 mg Oral BID  . rifampin  300 mg Oral Q8H  . simethicone  80 mg Oral QID  . sodium chloride flush  10-40 mL Intracatheter Q12H  . sodium chloride flush  3 mL Intravenous Q12H  . tamsulosin  0.4 mg Oral Daily  . vancomycin  125 mg Oral QID    Infusions: . heparin 1,000 Units/hr (09/07/15 0800)  . norepinephrine (LEVOPHED) Adult infusion Stopped (09/05/15 1553)  . vasopressin (PITRESSIN) infusion - *FOR SHOCK* Stopped (09/05/15 2200)    PRN Medications: sodium chloride, sodium chloride, sodium chloride, alteplase, bisacodyl **OR** bisacodyl, fentaNYL (SUBLIMAZE) injection, gelatin adsorbable, heparin, ondansetron, oxyCODONE, sodium chloride flush, sodium chloride flush, traMADol   Assessment:   1. LVAD Complication- Driveline abscess 2. MSSA bacteremia -> septic shock 3. Acute/Chronic systolic HF s/p VAD placement 9/16 4. C. Difficile colitis 5. 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 Acute blood loss anemia 8. Severe Malnutrition.   9.. ARF 10. Klebsiella (ESBL) pneumonia  Plan/Discussion:    S/P I&D LVAD driveline abscess. Exposed hardware. Returned to the OR [id number] with ACell application and VAD. Unfortunately VAC stopped due to clots. Continue wound care. Will need to go back to OR to cover VAD early next week.   ID following. Now on ceftriaxone for klebsiella PNA and MSSA. Oral vanc for c.diff.   Remains anuric. Tolerating HD well today. Weight coming down. Still with about 10 pounds of fluid on board. Co-ox ok. Now off pressors. MAP ok. VAD parameters ok.    HGb back down. Continue to transfuse as needed. Now on low dose heparin due to rising LDH. Would continue. Heparin level undetectable. Hopefully we can increase soon  Continue TFs.   Begin to mobilize as tolerated.   Remains critically ill. Discussed with Dr. Cyndia Bent.   The patient is critically ill with multiple organ systems failure and requires high complexity decision making for assessment and support, frequent evaluation and titration of therapies, application of advanced monitoring technologies and extensive interpretation of multiple databases.   Critical Care Time devoted to patient care services described in this note is 35 Minutes.   Length of Stay: 12  Glori Bickers MD  09/07/2015, 10:17 AM  VAD Team --- VAD ISSUES ONLY--- Pager [phone] (7am - 7am)  Advanced Heart Failure Team  Pager [phone] (M-F; 7a - 4p)  Please contact [facility] for night-coverage after hours (4p -7a ) and weekends on [email or web address]

## 2015-09-07 NOTE — Progress Notes (Signed)
Patient ID: [name], male   DOB: 09-05-1970, 45 y.o.   MRN: [id number]  HeartMate 2 Rounding Note  Subjective:    No specific complaints. Says he is passing gas.  Nurse overnight reported a lot of bloody drainage from the wound as well as from around the HD catheter and picc.  Hgb dropped to 7.1 from 8.6 yesterday am. Transfusing 2 units on HD this am. Remains on heparin 1000 units per hr with undetectable heparin levels.  Co-ox is 68.2 this am. CVP 7 as checked by Dr. Haroldine Laws  LDH is 248, stable from 263 yesterday   LVAD INTERROGATION:  HeartMate II LVAD:  Flow 4.9 liters/min, speed 9200, power 5.5, PI 7.5.    Objective:    Vital Signs:   Temp:  [98.3 F (36.8 C)-98.9 F (37.2 C)] 98.5 F (36.9 C) (05/20 0925) Pulse Rate:  [81-89] 86 (05/20 0925) Resp:  [17-36] 22 (05/20 0925) BP: (101-114)/(60-74) 108/72 mmHg (05/20 0915) SpO2:  [98 %-100 %] 100 % (05/20 0925) Arterial Line BP: (98-138)/(66-89) 116/76 mmHg (05/20 0925) Weight:  [88.8 kg (195 lb 12.3 oz)] 88.8 kg (195 lb 12.3 oz) (05/20 0700) Last BM Date: 09/06/15 Mean arterial Pressure 86  Intake/Output:   Intake/Output Summary (Last 24 hours) at 09/07/15 0931 Last data filed at 09/07/15 0900  Gross per 24 hour  Intake   2254 ml  Output    160 ml  Net   2094 ml     Physical Exam: General:  Anasarca, No resp difficulty, lethargic but responds appropriately HEENT: normal Cor: normal heart sounds with LVAD hum present. Lungs: clear HD catheter site with minimal drainage on dressing. Changed early this am. Sternal wound dressing dry Abdomen: soft, nontender,distended. Few bowel sounds. Extremities: anasarca, warm and well-perfused Neuro: alert & orientedx3  Telemetry: sinus 85  Labs: Basic Metabolic Panel:  Recent Labs Lab 09/03/15 0431  09/04/15 0411 09/04/15 1512 09/05/15 0450 09/05/15 0805 09/06/15 0415 09/07/15 0400  NA 134*  < > 134* 135 134* 138 138 139  K 4.0  < > 3.8 3.6 3.7 3.6  3.3* 3.3*  CL 102  < > 104 106 103 105 102 103  CO2 18*  < > 16* 15* 16*  --  21* 20*  GLUCOSE 107*  < > 104* 106* 99 92 93 82  BUN 17  < > 27* 32* 36* 36* 29* 44*  CREATININE 3.89*  < > 5.57* 6.10* 7.13* 7.70* 5.90* 7.49*  CALCIUM 7.5*  < > 7.4* 7.8* 7.8*  --  8.1* 8.4*  MG 1.8  --   --   --   --   --   --   --   PHOS 6.7*  --   --   --   --   --   --   --   < > = values in this interval not displayed.  Liver Function Tests:  Recent Labs Lab 09/03/15 0431 09/04/15 0411 09/05/15 0450 09/05/15 1827 09/06/15 0415 09/07/15 0400  AST 39 46* 41  --  46* 42*  ALT 27 31 20 18 17  8*  ALKPHOS 96 99 96  --  109 92  BILITOT 11.4* 9.4* 8.9*  --  9.1* 8.5*  PROT 6.0* 5.9* 5.9*  --  5.8* 5.7*  ALBUMIN 2.1* 1.8* 1.7*  --  1.7* 1.5*   No results for input(s): LIPASE, AMYLASE in the last 168 hours.  Recent Labs Lab 09/02/15 1045  AMMONIA 40*    CBC:  Recent Labs Lab 09/03/15 0431  09/04/15 0411 09/04/15 1215 09/05/15 0450 09/05/15 0805 09/06/15 0415 09/07/15 0400  WBC 20.0*  < > 13.8* 15.6* 12.6*  --  15.4* 13.3*  NEUTROABS 17.8*  --   --   --   --   --   --   --   HGB 8.5*  < > 7.9* 8.5* 8.0* 9.2* 8.6* 7.1*  HCT 23.8*  < > 23.0* 24.3* 23.1* 27.0* 25.2* 20.9*  MCV 69.0*  < > 69.9* 69.4* 70.4*  --  72.4* 71.6*  PLT 286  < > 212 171 156  --  175 181  < > = values in this interval not displayed.  INR:  Recent Labs Lab 09/01/15 0451  INR 1.54*    Other results:  EKG:   Imaging: Dg Chest Port 1 View  09/06/2015  CLINICAL DATA:  Irrigation and debridement of a sternal abscess, insertion of a dialysis catheter; patient has a left ventricular assist device EXAM: PORTABLE CHEST 1 VIEW COMPARISON:  Portable chest x-ray of Sep 05, 2015. FINDINGS: The lungs are reasonably well inflated. The interstitial markings remain increased. There is atelectasis or infiltrate in the right infrahilar region and in the retrocardiac region on the left. The a cardiac silhouette remains  enlarged. The left ventricular assist device is partially included in the field of view. The permanent pacemaker defibrillator is in stable position. A left atrial appendage clamp is present. The feeding tube tip projects below the inferior margin of the image. The dual-lumen dialysis catheter tip projects over the midportion of the SVC. The PICC line tip lies at approximately the same level. IMPRESSION: Stable appearance of the chest since yesterday's study. Persistent bibasilar atelectasis or pneumonia with pulmonary interstitial edema and moderate cardiomegaly. The support apparatus appears stable. Electronically Signed   By: David  Martinique M.D.   On: 09/06/2015 07:50   Dg Chest Port 1 View  09/05/2015  CLINICAL DATA:  Right-sided central venous line placement. EXAM: PORTABLE CHEST 1 VIEW COMPARISON:  09/04/2015 FINDINGS: There has been interval placement of dual lumen right subclavian approach central venous catheter with tip terminating at the expected location of cavoatrial junction. Right PICC line, enteric catheter, dual lead cardiac pacemaker and ventricular assist device are stable. Cardiomediastinal silhouette is enlarged. There is diffuse haziness of the lungs which may be seen with pulmonary edema. No large pleural effusions. Osseous structures are without acute abnormality. Median sternotomy wires are in place. Soft tissues are grossly normal. IMPRESSION: Interval placement of right subclavian approach central venous catheter with tip at the expected location of the cavoatrial junction. Stable remaining support apparatus. Persistent cardiomegaly. Probable pulmonary edema. Electronically Signed   By: Fidela Salisbury M.D.   On: 09/05/2015 16:52   Dg Fluoro Guide Cv Line-no Report  09/05/2015  CLINICAL DATA:  FLOURO GUIDE CV LINE Fluoroscopy was utilized by the requesting physician.  No radiographic interpretation.      Medications:     Scheduled Medications: . sodium chloride    Intravenous Once  . sodium chloride   Intravenous Once  . allopurinol  300 mg Oral Daily  . amiodarone  100 mg Oral Daily  . cefTRIAXone (ROCEPHIN)  IV  2 g Intravenous Q24H  . citalopram  20 mg Oral Daily  . docusate sodium  200 mg Oral Daily  . feeding supplement (NEPRO CARB STEADY)  1,000 mL Per Tube Q24H  . feeding supplement (NEPRO CARB STEADY)  237 mL Per Tube TID WC  .  feeding supplement (PRO-STAT SUGAR FREE 64)  30 mL Per Tube TID  . ferrous sulfate  325 mg Oral BID WC  . guaiFENesin  600 mg Oral BID  . metoCLOPramide (REGLAN) injection  5 mg Intravenous Q6H  . pantoprazole  40 mg Oral Daily  . pregabalin  75 mg Oral BID  . rifampin  300 mg Oral Q8H  . simethicone  80 mg Oral QID  . sodium chloride flush  10-40 mL Intracatheter Q12H  . sodium chloride flush  3 mL Intravenous Q12H  . tamsulosin  0.4 mg Oral Daily  . vancomycin  125 mg Oral QID     Infusions: . heparin 1,000 Units/hr (09/07/15 0800)  . norepinephrine (LEVOPHED) Adult infusion Stopped (09/05/15 1553)  . vasopressin (PITRESSIN) infusion - *FOR SHOCK* Stopped (09/05/15 2200)     PRN Medications:  sodium chloride, sodium chloride, sodium chloride, alteplase, bisacodyl **OR** bisacodyl, fentaNYL (SUBLIMAZE) injection, gelatin adsorbable, heparin, ondansetron, oxyCODONE, sodium chloride flush, sodium chloride flush, traMADol   Assessment / Plan/Discussion:    He is hemodynamically stable with stable pump parameters and no PI events.  Acute blood loss anemia related to some bleeding from the sternal wound yesterday and some oozing around catheters. The dressing looks dry this am. Will continue heparin at 1000 units per hr since heparin levels undetectable and if dressing remains dry plan to increase later today. LDH remains low.  Acute renal failure due to ATN: continue dialysis. Hopeful for recovery  Tolerating tube feeds at 20. Goal is 45. Will advance.  Continue wound dressing changes and plan  placement of external closure device in the OR Monday.  Length of Stay: [address]  Gaye Pollack 09/07/2015, 9:31 AM

## 2015-09-07 NOTE — Progress Notes (Signed)
ANTICOAGULATION CONSULT NOTE - Follow Up Consult  Pharmacy Consult for Heparin Indication: LvAD  Allergies  Allergen Reactions  . Phytonadione Other (See Comments)    Patient has LVAD: please check with LVAD coordinator on call or LVAD MD on call before reversal of anticoagulation with vit k    Patient Measurements: Height: 5\' 7"  (170.2 cm) Weight: 195 lb 12.3 oz (88.8 kg) IBW/kg (Calculated) : 66.1 Heparin Dosing Weight:  85 kg  Vital Signs: Temp: 98.6 F (37 C) (05/19 2359) Temp Source: Oral (05/19 2359) Pulse Rate: 86 (05/20 0700)  Labs:  Recent Labs  09/05/15 0450 09/05/15 0805 09/06/15 0415 09/06/15 1735 09/06/15 2005 09/07/15 0400 09/07/15 0416  HGB 8.0* 9.2* 8.6*  --   --  7.1*  --   HCT 23.1* 27.0* 25.2*  --   --  20.9*  --   PLT 156  --  175  --   --  181  --   HEPARINUNFRC  --   --   --  1.00* <0.10*  --  <0.10*  CREATININE 7.13* 7.70* 5.90*  --   --  7.49*  --     Estimated Creatinine Clearance: 13 mL/min (by C-G formula based on Cr of 7.49).  Assessment: 45yom on coumadin pta for his LVAD, admitted with driveline abscess, sternal wound infection, and MSSA bacteremia. Home Coumadin held, and heparin bridge started 5/10 pending OR trips for wound debridement.   Heparin is still <0.1. Bleeding is still an issue. Dr. Cyndia Bent will eval the issue before making a decision on heparin rate.   Goal of Therapy:  HL 0.2-0.3 Monitor platelets by anticoagulation protocol: Yes   Plan:  Continue IV heparin at 1000 units/hr for now HL and CBC in AM   Onnie Boer, PharmD Pager: [phone] 09/07/2015 7:38 AM

## 2015-09-08 DIAGNOSIS — K529 Noninfective gastroenteritis and colitis, unspecified: Secondary | ICD-10-CM

## 2015-09-08 LAB — HEPARIN LEVEL (UNFRACTIONATED): Heparin Unfractionated: <0.1 IU/mL — ABNORMAL LOW (ref 0.30–0.70)

## 2015-09-08 LAB — GLUCOSE, CAPILLARY
GLUCOSE-CAPILLARY: 107 mg/dL — AB (ref 65–99)
GLUCOSE-CAPILLARY: 85 mg/dL (ref 65–99)
Glucose-Capillary: 87 mg/dL (ref 65–99)
Glucose-Capillary: 118 mg/dL — ABNORMAL HIGH (ref 65–99)

## 2015-09-08 LAB — RENAL FUNCTION PANEL
ANION GAP: 11 (ref 5–15)
Albumin: 1.4 g/dL — ABNORMAL LOW (ref 3.5–5.0)
BUN: 53 mg/dL — ABNORMAL HIGH (ref 6–20)
CHLORIDE: 102 mmol/L (ref 101–111)
CO2: 22 mmol/L (ref 22–32)
Calcium: 8.5 mg/dL — ABNORMAL LOW (ref 8.9–10.3)
Creatinine, Ser: 7.03 mg/dL — ABNORMAL HIGH (ref 0.61–1.24)
GFR calc Af Amer: 10 mL/min — ABNORMAL LOW (ref >60)
GFR calc non Af Amer: 8 mL/min — ABNORMAL LOW (ref >60)
GLUCOSE: 105 mg/dL — AB (ref 65–99)
POTASSIUM: 3 mmol/L — AB (ref 3.5–5.1)
Phosphorus: 5.8 mg/dL — ABNORMAL HIGH (ref 2.5–4.6)
Sodium: 135 mmol/L (ref 135–145)

## 2015-09-08 LAB — CBC
HCT: 23.4 % — ABNORMAL LOW (ref 39.0–52.0)
Hemoglobin: 7.9 g/dL — ABNORMAL LOW (ref 13.0–17.0)
MCH: 25 pg — ABNORMAL LOW (ref 26.0–34.0)
MCHC: 33.8 g/dL (ref 30.0–36.0)
MCV: 74.1 fL — ABNORMAL LOW (ref 78.0–100.0)
Platelets: 174 10*3/uL (ref 150–400)
RBC: 3.16 MIL/uL — ABNORMAL LOW (ref 4.22–5.81)
RDW: 24.2 % — ABNORMAL HIGH (ref 11.5–15.5)
WBC: 14.3 10*3/uL — ABNORMAL HIGH (ref 4.0–10.5)

## 2015-09-08 LAB — PREPARE RBC (CROSSMATCH)

## 2015-09-08 LAB — CARBOXYHEMOGLOBIN
CARBOXYHEMOGLOBIN: 1.9 % — AB (ref 0.5–1.5)
CARBOXYHEMOGLOBIN: 2.5 % — AB (ref 0.5–1.5)
METHEMOGLOBIN: 1.4 % (ref 0.0–1.5)
Methemoglobin: 1.4 % (ref 0.0–1.5)
O2 SAT: 52.5 %
O2 Saturation: 53.4 %
TOTAL HEMOGLOBIN: 7.9 g/dL — AB (ref 13.5–18.0)
Total hemoglobin: 9.8 g/dL — ABNORMAL LOW (ref 13.5–18.0)

## 2015-09-08 LAB — LACTATE DEHYDROGENASE: LDH: 238 U/L — AB (ref 98–192)

## 2015-09-08 MED ORDER — SODIUM CHLORIDE 0.9 % IV SOLN: Freq: Once | INTRAVENOUS | Status: DC

## 2015-09-08 MED ORDER — CEFAZOLIN SODIUM-DEXTROSE 2-4 GM/100ML-% IV SOLN
2.0000 g | INTRAVENOUS | Status: DC
  Filled 2015-09-08 (×2): qty 100

## 2015-09-08 MED ORDER — SODIUM CHLORIDE 0.9 % IV SOLN
Freq: Once | INTRAVENOUS | Status: AC
  Administered 2015-09-08: 12:00:00 via INTRAVENOUS

## 2015-09-08 MED ORDER — MILRINONE LACTATE IN DEXTROSE 20-5 MG/100ML-% IV SOLN
0.1250 ug/kg/min | INTRAVENOUS | Status: DC
  Administered 2015-09-08 – 2015-09-09 (×2): 0.125 ug/kg/min via INTRAVENOUS
  Filled 2015-09-08 (×2): qty 100

## 2015-09-08 NOTE — Progress Notes (Signed)
ANTICOAGULATION CONSULT NOTE - Follow Up Consult  Pharmacy Consult for Heparin Indication: LvAD  Allergies  Allergen Reactions  . Phytonadione Other (See Comments)    Patient has LVAD: please check with LVAD coordinator on call or LVAD MD on call before reversal of anticoagulation with vit k    Patient Measurements: Height: 5\' 7"  (170.2 cm) Weight: 187 lb 6.3 oz (85 kg) IBW/kg (Calculated) : 66.1 Heparin Dosing Weight:  85 kg  Vital Signs: Temp: 98.4 F (36.9 C) (05/21 0430) Temp Source: Oral (05/21 0430) BP: 118/80 mmHg (05/21 0800) Pulse Rate: 88 (05/21 0700)  Labs:  Recent Labs  09/06/15 0415  09/06/15 2005 09/07/15 0400 09/07/15 0416 09/07/15 1952 09/08/15 0357 09/08/15 0358  HGB 8.6*  --   --  7.1*  --  8.6*  --  7.9*  HCT 25.2*  --   --  20.9*  --  25.2*  --  23.4*  PLT 175  --   --  181  --  157  --  174  HEPARINUNFRC  --   < > <0.10*  --  <0.10*  --  <0.10*  --   CREATININE 5.90*  --   --  7.49*  --   --   --   --   < > = values in this interval not displayed.  Estimated Creatinine Clearance: 12.7 mL/min (by C-G formula based on Cr of 7.49).  Assessment: 45yom on coumadin pta for his LVAD, admitted with driveline abscess, sternal wound infection, and MSSA bacteremia. Home Coumadin held, and heparin bridge started 5/10 pending OR trips for wound debridement.   Heparin is still <0.1 and bleeding continues to be an issue so MD has asked that rate not be increased. Will need to discuss rate adjustment and recommendations with the MD.   Goal of Therapy:  HL 0.2-0.3 Monitor platelets by anticoagulation protocol: Yes   Plan:  Continue IV heparin at 1000 units/hr Daily heparin level and CBC MD - please evaluate if heparin rate can be increased  Salome Arnt, PharmD, BCPS Pager # [phone] 09/08/2015 11:19 AM

## 2015-09-08 NOTE — Anesthesia Preprocedure Evaluation (Addendum)
Anesthesia Evaluation  Patient identified by MRN, date of birth, ID band Patient awake    Reviewed: Allergy & Precautions, NPO status , Patient's Chart, lab work & pertinent test results  History of Anesthesia Complications Negative for: history of anesthetic complications  Airway Mallampati: II  TM Distance: >3 FB Neck ROM: Full    Dental  (+) Teeth Intact, Dental Advisory Given   Pulmonary neg pulmonary ROS,    Pulmonary exam normal breath sounds clear to auscultation       Cardiovascular hypertension, Pt. on medications +CHF  Normal cardiovascular exam+ dysrhythmias Atrial Fibrillation and Ventricular Fibrillation + Cardiac Defibrillator + Valvular Problems/Murmurs  Rhythm:Regular  LVAD in situ   Neuro/Psych PSYCHIATRIC DISORDERS Depression negative neurological ROS     GI/Hepatic negative GI ROS, Neg liver ROS,   Endo/Other    Renal/GU Renal disease  negative genitourinary   Musculoskeletal  (+) Arthritis ,   Abdominal   Peds negative pediatric ROS (+)  Hematology  (+) Blood dyscrasia, anemia ,   Anesthesia Other Findings LVAD  Reproductive/Obstetrics negative OB ROS                            Anesthesia Physical  Anesthesia Plan  ASA: IV  Anesthesia Plan: General   Post-op Pain Management:    Induction: Intravenous  Airway Management Planned: Oral ETT  Additional Equipment: Arterial line  Intra-op Plan:   Post-operative Plan: Extubation in OR and Possible Post-op intubation/ventilation  Informed Consent: I have reviewed the patients History and Physical, chart, labs and discussed the procedure including the risks, benefits and alternatives for the proposed anesthesia with the patient or authorized representative who has indicated his/her understanding and acceptance.   Dental advisory given  Plan Discussed with: Surgeon and CRNA  Anesthesia Plan Comments:          Anesthesia Quick Evaluation

## 2015-09-08 NOTE — Progress Notes (Signed)
Patient ID: [name], male   DOB: 1970-10-10, 45 y.o.   MRN: [id number]    HeartMate 2 Rounding Note  Subjective:    Admitted from [facility] in [place] with driveline abscess. Blood cultures --> MSSA and has C diff. Remains on Nafcillin  and oral vancomycin.   08/27/15 S/P I &D Epigastric with hardware exposed and VAC placement.  5/11 repeat I&D and wound vac replacement.  5/11 TEE no vegetations on valves or ICD wire   Tolerated HD well yesterday. Still with just minimal urine output but it is clear. Feels better today. Was oozing from HD cath yesterday. Now better. On heparin at 1,000 but level low. Hgb back down to 7.9. Getting another unit of RBCs today. CVP 12-13  Off pressors. Co-ox 52%.   LDH 177>212>231>244>263>238   LVAD INTERROGATION:  HeartMate II LVAD:  Flow 5.2 liters/min, speed 9200, power 6.0  PI 7.5  No PI events.   Objective:    Vital Signs:   Temp:  [98.3 F (36.8 C)-98.4 F (36.9 C)] 98.4 F (36.9 C) (05/21 0430) Pulse Rate:  [87-96] 88 (05/21 0700) Resp:  [18-27] 26 (05/21 0700) BP: (101-129)/(65-86) 118/80 mmHg (05/21 0800) SpO2:  [93 %-100 %] 97 % (05/21 0700) Arterial Line BP: (101-130)/(67-83) 115/77 mmHg (05/21 0700) Weight:  [85 kg (187 lb 6.3 oz)] 85 kg (187 lb 6.3 oz) (05/21 0600) Last BM Date: 09/07/15 Mean arterial Pressure  70-90s    Intake/Output:   Intake/Output Summary (Last 24 hours) at 09/08/15 1111 Last data filed at 09/08/15 0700  Gross per 24 hour  Intake   1545 ml  Output     90 ml  Net   1455 ml     Physical Exam: CVP 14 General: In bed. Very weak. Falls asleep in mid converstation HEENT: Scleral icterus Neck: supple.  JVP jaw Carotids 2+ bilat; no bruits. No lymphadenopathy or thryomegaly appreciated. R subclavian trialysis cath dressing dry Cor: Mechanical heart sounds with LVAD hum present. Lungs: clear  Abdomen: soft, tender, distended. Good bowel sounds. Mid epigastric dressing.   Driveline:  C/D/I; securement device intact and driveline incorporated Extremities: no cyanosis, clubbing, rash, 1+ edema R and LLE SCDs.  Neuro: alert & orientedx3, cranial nerves grossly intact. moves all 4 extremities w/o difficulty.  Telemetry: NSR 70s   Labs: Basic Metabolic Panel:  Recent Labs Lab 09/03/15 0431  09/04/15 0411 09/04/15 1512 09/05/15 0450 09/05/15 0805 09/06/15 0415 09/07/15 0400  NA 134*  < > 134* 135 134* 138 138 139  K 4.0  < > 3.8 3.6 3.7 3.6 3.3* 3.3*  CL 102  < > 104 106 103 105 102 103  CO2 18*  < > 16* 15* 16*  --  21* 20*  GLUCOSE 107*  < > 104* 106* 99 92 93 82  BUN 17  < > 27* 32* 36* 36* 29* 44*  CREATININE 3.89*  < > 5.57* 6.10* 7.13* 7.70* 5.90* 7.49*  CALCIUM 7.5*  < > 7.4* 7.8* 7.8*  --  8.1* 8.4*  MG 1.8  --   --   --   --   --   --   --   PHOS 6.7*  --   --   --   --   --   --   --   < > = values in this interval not displayed.  Liver Function Tests:  Recent Labs Lab 09/03/15 0431 09/04/15 0411 09/05/15 0450 09/05/15 1827 09/06/15 0415 09/07/15  0400  AST 39 46* 41  --  46* 42*  ALT 27 31 20 18 17  8*  ALKPHOS 96 99 96  --  109 92  BILITOT 11.4* 9.4* 8.9*  --  9.1* 8.5*  PROT 6.0* 5.9* 5.9*  --  5.8* 5.7*  ALBUMIN 2.1* 1.8* 1.7*  --  1.7* 1.5*   No results for input(s): LIPASE, AMYLASE in the last 168 hours.  Recent Labs Lab 09/02/15 1045  AMMONIA 40*    CBC:  Recent Labs Lab 09/03/15 0431  09/05/15 0450 09/05/15 0805 09/06/15 0415 09/07/15 0400 09/07/15 1952 09/08/15 0358  WBC 20.0*  < > 12.6*  --  15.4* 13.3* 14.9* 14.3*  NEUTROABS 17.8*  --   --   --   --   --   --   --   HGB 8.5*  < > 8.0* 9.2* 8.6* 7.1* 8.6* 7.9*  HCT 23.8*  < > 23.1* 27.0* 25.2* 20.9* 25.2* 23.4*  MCV 69.0*  < > 70.4*  --  72.4* 71.6* 75.9* 74.1*  PLT 286  < > 156  --  175 181 157 174  < > = values in this interval not displayed.  INR: No results for input(s): INR in the last 168 hours.  Other results:    Imaging: Dg Chest Port 1  View  09/07/2015  CLINICAL DATA:  Shortness of breath.  Subsequent encounter. EXAM: PORTABLE CHEST 1 VIEW COMPARISON:  09/06/2015 FINDINGS: Cardiac silhouette remains mildly enlarged. Changes from cardiac surgery are stable. No mediastinal widening. Dual-lumen right subclavian central venous catheter, right PICC, enteric tube, left ventricular assist device and left anterior chest wall AICD are stable and well positioned. Lungs show vascular congestion, interstitial thickening and mild hazy airspace opacity accentuated by low lung volumes respiratory motion. A No convincing pleural effusion.  No pneumothorax. IMPRESSION: 1. Lung aeration appears worsened from the previous day's study consistent with the development of pulmonary edema. 2. Stable changes from cardiac surgery with placement a left ventricular assist device. Support apparatus is stable and well positioned. 3. No pneumothorax.  No mediastinal widening. Electronically Signed   By: Lajean Manes M.D.   On: 09/07/2015 11:05     Medications:     Scheduled Medications: . sodium chloride   Intravenous Once  . sodium chloride   Intravenous Once  . allopurinol  300 mg Oral Daily  . amiodarone  100 mg Oral Daily  . cefTRIAXone (ROCEPHIN)  IV  2 g Intravenous Q24H  . citalopram  20 mg Oral Daily  . docusate sodium  200 mg Oral Daily  . feeding supplement (NEPRO CARB STEADY)  1,000 mL Per Tube Q24H  . feeding supplement (NEPRO CARB STEADY)  237 mL Per Tube TID WC  . feeding supplement (PRO-STAT SUGAR FREE 64)  30 mL Per Tube TID  . ferrous sulfate  325 mg Oral BID WC  . guaiFENesin  600 mg Oral BID  . metoCLOPramide (REGLAN) injection  5 mg Intravenous Q6H  . pantoprazole  40 mg Oral Daily  . pregabalin  75 mg Oral BID  . rifampin  300 mg Oral Q8H  . simethicone  80 mg Oral QID  . sodium chloride flush  10-40 mL Intracatheter Q12H  . sodium chloride flush  3 mL Intravenous Q12H  . tamsulosin  0.4 mg Oral Daily  . vancomycin  125 mg Oral  QID    Infusions: . heparin 1,000 Units/hr (09/08/15 0700)  . norepinephrine (LEVOPHED) Adult infusion Stopped (09/05/15  1553)  . vasopressin (PITRESSIN) infusion - *FOR SHOCK* Stopped (09/05/15 2200)    PRN Medications: sodium chloride, sodium chloride, sodium chloride, alteplase, bisacodyl **OR** bisacodyl, fentaNYL (SUBLIMAZE) injection, gelatin adsorbable, heparin, ondansetron, oxyCODONE, sodium chloride flush, sodium chloride flush, traMADol   Assessment:   1. LVAD Complication- Driveline abscess 2. MSSA bacteremia -> septic shock 3. Acute/Chronic systolic HF s/p VAD placement 9/16 4. C. Difficile colitis 5. 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 Acute blood loss anemia 8. Severe Malnutrition.   9.. ARF 10. Klebsiella (ESBL) pneumonia  Plan/Discussion:    S/P I&D LVAD driveline abscess. Exposed hardware. Returned to the OR [id number] with ACell application and VAD. Unfortunately VAC stopped due to clots. Continue wound care. Plan to go back to OR to cover VAD tomorrow am.    ID following. Now on ceftriaxone for klebsiella PNA and MSSA. Oral vanc for c.diff. WBC trending down.   Urine out poor. Tolerating HD well today. Weight coming down. Still with about 5-10 pounds of fluid on board. Co-ox low again. Will repeat. If remains poor will restart milrinone (if BP unable to tolerate can use norepi) for RV support.  VAD parameters ok. PI is high - may be able to go up to 9400 but need to be careful with RV failure.     HGb back down. Getting 1u RBC. Now on low dose heparin. Would continue. Heparin level undetectable. Hopefully we can increase soon. LDH back down.   Continue TFs. Supp K+ gently.   Begin to mobilize as tolerated.   Remains critically ill. Discussed with Dr. Cyndia Bent.   The patient is critically ill with multiple organ systems failure and requires high complexity decision making for assessment and support, frequent evaluation and  titration of therapies, application of advanced monitoring technologies and extensive interpretation of multiple databases.   Critical Care Time devoted to patient care services described in this note is 35 Minutes.   Length of Stay: 13  Glori Bickers MD  09/08/2015, 11:11 AM  VAD Team --- VAD ISSUES ONLY--- Pager [phone] (7am - 7am)  Advanced Heart Failure Team  Pager [phone] (M-F; 7a - 4p)  Please contact [facility] for night-coverage after hours (4p -7a ) and weekends on [email or web address]

## 2015-09-08 NOTE — Progress Notes (Signed)
Admit: 08/26/2015 LOS: 13  63M normal baseline SCr admitted with LVAD MSSA Driveline Infection and Abscess, C diff colitis, with septic shock, and AKI 2/2 ATN  Subjective:  HD yesterday, 3L Uf, tolerated well. rec 2u PRBC No inc UOP Tentative back to OR tomorrow 5/22   05/20 0701 - 05/21 0700 In: 2325 [P.O.:300; I.V.:240; Blood:610; NG/GT:1125; IV Piggyback:50] Out: 3108 [Urine:100]  Filed Weights   09/07/15 0700 09/07/15 1035 09/08/15 0600  Weight: 88.8 kg (195 lb 12.3 oz) 85.8 kg (189 lb 2.5 oz) 85 kg (187 lb 6.3 oz)    Scheduled Meds: . sodium chloride   Intravenous Once  . sodium chloride   Intravenous Once  . allopurinol  300 mg Oral Daily  . amiodarone  100 mg Oral Daily  . cefTRIAXone (ROCEPHIN)  IV  2 g Intravenous Q24H  . citalopram  20 mg Oral Daily  . docusate sodium  200 mg Oral Daily  . feeding supplement (NEPRO CARB STEADY)  1,000 mL Per Tube Q24H  . feeding supplement (NEPRO CARB STEADY)  237 mL Per Tube TID WC  . feeding supplement (PRO-STAT SUGAR FREE 64)  30 mL Per Tube TID  . ferrous sulfate  325 mg Oral BID WC  . guaiFENesin  600 mg Oral BID  . metoCLOPramide (REGLAN) injection  5 mg Intravenous Q6H  . pantoprazole  40 mg Oral Daily  . pregabalin  75 mg Oral BID  . rifampin  300 mg Oral Q8H  . simethicone  80 mg Oral QID  . sodium chloride flush  10-40 mL Intracatheter Q12H  . sodium chloride flush  3 mL Intravenous Q12H  . tamsulosin  0.4 mg Oral Daily  . vancomycin  125 mg Oral QID   Continuous Infusions: . heparin 1,000 Units/hr (09/08/15 0700)  . norepinephrine (LEVOPHED) Adult infusion Stopped (09/05/15 1553)  . vasopressin (PITRESSIN) infusion - *FOR SHOCK* Stopped (09/05/15 2200)   PRN Meds:.sodium chloride, sodium chloride, sodium chloride, alteplase, bisacodyl **OR** bisacodyl, fentaNYL (SUBLIMAZE) injection, gelatin adsorbable, heparin, ondansetron, oxyCODONE, sodium chloride flush, sodium chloride flush, traMADol  Current Labs:  reviewed   Physical Exam:  Blood pressure 123/82, pulse 88, temperature 98.4 F (36.9 C), temperature source Oral, resp. rate 26, height 5\' 7"  (1.702 m), weight 85 kg (187 lb 6.3 oz), SpO2 97 %. GEN: resting, opens eyes to voice and converses ENT: NCAT EYES: EOMI, muddy sclera CV: Continuous home present, minimal audible S1 and S2 PULM: Clear bilaterally ABD: Soft, nontender SKIN: Clean bandages over xiphoid region EXT: Trace to 1+ edema Foley catheter present  Renal Studies 1. Urine Sediment 5/16: Numerous darkly pigmented granular and degenerating cellular casts, abundant scattered pigmented granular debris, numerous broad waxy cast, abundant transitional cells and squamous cells. Scattered are RTEs No erythrocytes with dysmorphic features. No WBC casts. Sediment consistent with ATN, features of CKD 2. Renal US 5/16L R 10.3cm, L 10.7cm, no obstruction 3. 5/16 CK: neg 4. 5/18: start iHD  Assessment  1. AKI, anuric with Urine sediment consistent with ATN 1. ATN.   rhabdo excluded. Doubt AIN but had significant PCN Exposure  2. Baseline SCr 1.1-1.4 3. Renal US 5/16 w/o structural / obstructive issues 4. Daily weights, Daily Renal Panel, Strict I/Os, Avoid nephrotoxins (NSAIDs, judicious IV Contrast)  2. MSSA Bacteremia and LVAD Driveline Infection: per TCTS, AHF, RCID; TEE w/o vegetation 3. Septic Shock on ceftriaxone, Rifampin, Vanc PO/IV 4. NICM, chronic sHF; LVAD present 5. Anemia in AKI 6. C Dificile colitis on PO Vanc 7.  HCAP 8. AFib on ami  Plan 1. Tentative for IHD again tomorrow based on OR schedule, should be able to to come to HD unit 2. F/u labs this AM 3. If inc UOP can start to watch off HD for recovery; no evidence thus far  Pearson Grippe MD 09/08/2015, 8:02 AM   Recent Labs Lab 09/03/15 0431  09/05/15 0450 09/05/15 0805 09/06/15 0415 09/07/15 0400  NA 134*  < > 134* 138 138 139  K 4.0  < > 3.7 3.6 3.3* 3.3*  CL 102  < > 103 105 102 103  CO2 18*   < > 16*  --  21* 20*  GLUCOSE 107*  < > 99 92 93 82  BUN 17  < > 36* 36* 29* 44*  CREATININE 3.89*  < > 7.13* 7.70* 5.90* 7.49*  CALCIUM 7.5*  < > 7.8*  --  8.1* 8.4*  PHOS 6.7*  --   --   --   --   --   < > = values in this interval not displayed.  Recent Labs Lab 09/03/15 0431  09/07/15 0400 09/07/15 1952 09/08/15 0358  WBC 20.0*  < > 13.3* 14.9* 14.3*  NEUTROABS 17.8*  --   --   --   --   HGB 8.5*  < > 7.1* 8.6* 7.9*  HCT 23.8*  < > 20.9* 25.2* 23.4*  MCV 69.0*  < > 71.6* 75.9* 74.1*  PLT 286  < > 181 157 174  < > = values in this interval not displayed.

## 2015-09-08 NOTE — Progress Notes (Signed)
Patient ID: [name], male   DOB: 02-12-1971, 45 y.o.   MRN: [id number]  SICU Evening Rounds:  Hemodynamics stable. Co-ox this am 52.5 and then 53 on repeat so started on Milrinone 0.125. CVP was 12. Pump parameters stable.  Transfused 1 unit today.  Made 100 cc urine this afternoon  BMET    Component Value Date/Time   NA 135 09/08/2015 1201   K 3.0* 09/08/2015 1201   CL 102 09/08/2015 1201   CO2 22 09/08/2015 1201   GLUCOSE 105* 09/08/2015 1201   BUN 53* 09/08/2015 1201   CREATININE 7.03* 09/08/2015 1201   CREATININE 0.92 02/01/2015 1132   CALCIUM 8.5* 09/08/2015 1201   GFRNONAA 8* 09/08/2015 1201   GFRAA 10* 09/08/2015 1201    Remains on heparin at 1000 an will stop for OR in the am.

## 2015-09-08 NOTE — Progress Notes (Signed)
[place] for Infectious Disease   Reason for visit: Follow up on drive line infection, C diff  Interval History:  Less stool output and more formed today.  Afebrile.   He denies any pain.  On CRRT.  WBC 14.3.   Physical Exam: Constitutional:  Filed Vitals:   09/08/15 1000 09/08/15 1100  BP: 120/80   Pulse: 88 86  Temp:    Resp: 20 20   patient appears in NAD Respiratory: Normal respiratory effort; CTA B anterior exam GI: soft, nt MS: no edema Skin: no rashes   Review of Systems: Respiratory: negative for cough Integument/breast: negative for rash  Lab Results  Component Value Date   WBC 14.3* 09/08/2015   HGB 7.9* 09/08/2015   HCT 23.4* 09/08/2015   MCV 74.1* 09/08/2015   PLT 174 09/08/2015    Lab Results  Component Value Date   CREATININE 7.49* 09/07/2015   BUN 44* 09/07/2015   NA 139 09/07/2015   K 3.3* 09/07/2015   CL 103 09/07/2015   CO2 20* 09/07/2015    Lab Results  Component Value Date   ALT 8* 09/07/2015   AST 42* 09/07/2015   ALKPHOS 92 09/07/2015     Microbiology: Recent Results (from the past 240 hour(s))  Culture, respiratory (NON-Expectorated)     Status: None   Collection Time: 09/02/15  8:30 AM  Result Value Ref Range Status   Specimen Description TRACHEAL ASPIRATE  Final   Special Requests Normal  Final   Gram Stain   Final    MODERATE WBC PRESENT,BOTH PMN AND MONONUCLEAR FEW SQUAMOUS EPITHELIAL CELLS PRESENT FEW GRAM NEGATIVE RODS Performed at [facility]    Culture   Final    MODERATE KLEBSIELLA PNEUMONIAE Performed at [facility]    Report Status 09/04/2015 FINAL  Final   Organism ID, Bacteria KLEBSIELLA PNEUMONIAE  Final      Susceptibility   Klebsiella pneumoniae - MIC*    AMPICILLIN >=32 RESISTANT Resistant     AMPICILLIN/SULBACTAM 16 INTERMEDIATE Intermediate     CEFEPIME <=1 SENSITIVE Sensitive     CEFTAZIDIME <=1 SENSITIVE Sensitive     CEFTRIAXONE <=1 SENSITIVE Sensitive    CIPROFLOXACIN <=0.25 SENSITIVE Sensitive     GENTAMICIN <=1 SENSITIVE Sensitive     IMIPENEM <=0.25 SENSITIVE Sensitive     PIP/TAZO 16 SENSITIVE Sensitive     TOBRAMYCIN <=1 SENSITIVE Sensitive     TRIMETH/SULFA Value in next row Sensitive      <=20 SENSITIVE(NOTE)    * MODERATE KLEBSIELLA PNEUMONIAE  Culture, blood (single) w Reflex to ID Panel     Status: None   Collection Time: 09/02/15 10:20 AM  Result Value Ref Range Status   Specimen Description BLOOD LEFT HAND  Final   Special Requests IN PEDIATRIC BOTTLE 1CC  Final   Culture NO GROWTH 5 DAYS  Final   Report Status 09/07/2015 FINAL  Final  Urine culture     Status: Abnormal   Collection Time: 09/02/15  5:30 PM  Result Value Ref Range Status   Specimen Description URINE, CLEAN CATCH  Final   Special Requests NONE  Final   Culture MULTIPLE SPECIES PRESENT, SUGGEST RECOLLECTION (A)  Final   Report Status 09/04/2015 FINAL  Final    Impression/Plan:  1. Drive line infection - on ceftriaxone and rifampin.  Cefazolin tomorrow.  2. Aspiration pneumonia - on ceftriaxone, day 7/7. 3.  C diff - seems to be improving on vancomycin, will continue.  Dr. Baxter Flattery back tomorrow

## 2015-09-08 NOTE — Progress Notes (Signed)
Patient ID: [name], male   DOB: [birth year], 45 y.o.   MRN: [id number] HeartMate 2 Rounding Note  Subjective:    No complaints. Had a little liquids this am. Passing flatus and had BM this am. Says his belly feels fine.  Co-ox 52.5 this am but I am a little suspect about the results since the Co-ox on our other ICU patient this am is obviously inaccurate.  CVP 12 done by DB  LVAD INTERROGATION:  HeartMate II LVAD:  Flow 5.0 liters/min, speed 9200, power 5.3, PI 7.8.  No PI events  Objective:    Vital Signs:   Temp:  [98.3 F (36.8 C)-98.4 F (36.9 C)] 98.4 F (36.9 C) (05/21 0430) Pulse Rate:  [87-96] 88 (05/21 0700) Resp:  [18-27] 26 (05/21 0700) BP: (101-129)/(65-86) 118/80 mmHg (05/21 0800) SpO2:  [93 %-100 %] 97 % (05/21 0700) Arterial Line BP: (101-130)/(67-83) 115/77 mmHg (05/21 0700) Weight:  [85 kg (187 lb 6.3 oz)] 85 kg (187 lb 6.3 oz) (05/21 0600) Last BM Date: 09/07/15 Mean arterial Pressure 98  Intake/Output:   Intake/Output Summary (Last 24 hours) at 09/08/15 1117 Last data filed at 09/08/15 0700  Gross per 24 hour  Intake   1545 ml  Output     90 ml  Net   1455 ml     Physical Exam: General:  Chronically ill- appearing. No resp difficulty, more alert and conversant this am. HEENT: normal Cor: Normal heart sounds with LVAD hum present. Lungs: diminished in bases Abdomen: soft, nontender, mildly distended. Good bowel sounds. Extremities: moderate edema Neuro: alert & orientedx3, cranial nerves grossly intact. moves all 4 extremities w/o difficulty. Affect pleasant  Telemetry: sinus 87  Labs: Basic Metabolic Panel:  Recent Labs Lab 09/03/15 0431  09/04/15 0411 09/04/15 1512 09/05/15 0450 09/05/15 0805 09/06/15 0415 09/07/15 0400  NA 134*  < > 134* 135 134* 138 138 139  K 4.0  < > 3.8 3.6 3.7 3.6 3.3* 3.3*  CL 102  < > 104 106 103 105 102 103  CO2 18*  < > 16* 15* 16*  --  21* 20*  GLUCOSE 107*  < > 104* 106* 99 92 93 82  BUN 17  < >  27* 32* 36* 36* 29* 44*  CREATININE 3.89*  < > 5.57* 6.10* 7.13* 7.70* 5.90* 7.49*  CALCIUM 7.5*  < > 7.4* 7.8* 7.8*  --  8.1* 8.4*  MG 1.8  --   --   --   --   --   --   --   PHOS 6.7*  --   --   --   --   --   --   --   < > = values in this interval not displayed.  Liver Function Tests:  Recent Labs Lab 09/03/15 0431 09/04/15 0411 09/05/15 0450 09/05/15 1827 09/06/15 0415 09/07/15 0400  AST 39 46* 41  --  46* 42*  ALT 27 31 20 18 17  8*  ALKPHOS 96 99 96  --  109 92  BILITOT 11.4* 9.4* 8.9*  --  9.1* 8.5*  PROT 6.0* 5.9* 5.9*  --  5.8* 5.7*  ALBUMIN 2.1* 1.8* 1.7*  --  1.7* 1.5*   No results for input(s): LIPASE, AMYLASE in the last 168 hours.  Recent Labs Lab 09/02/15 1045  AMMONIA 40*    CBC:  Recent Labs Lab 09/03/15 0431  09/05/15 0450 09/05/15 0805 09/06/15 0415 09/07/15 0400 09/07/15 1952 09/08/15 0358  WBC 20.0*  < >  12.6*  --  15.4* 13.3* 14.9* 14.3*  NEUTROABS 17.8*  --   --   --   --   --   --   --   HGB 8.5*  < > 8.0* 9.2* 8.6* 7.1* 8.6* 7.9*  HCT 23.8*  < > 23.1* 27.0* 25.2* 20.9* 25.2* 23.4*  MCV 69.0*  < > 70.4*  --  72.4* 71.6* 75.9* 74.1*  PLT 286  < > 156  --  175 181 157 174  < > = values in this interval not displayed.  INR: No results for input(s): INR in the last 168 hours.  Other results:  EKG:   Imaging: Dg Chest Port 1 View  09/07/2015  CLINICAL DATA:  Shortness of breath.  Subsequent encounter. EXAM: PORTABLE CHEST 1 VIEW COMPARISON:  09/06/2015 FINDINGS: Cardiac silhouette remains mildly enlarged. Changes from cardiac surgery are stable. No mediastinal widening. Dual-lumen right subclavian central venous catheter, right PICC, enteric tube, left ventricular assist device and left anterior chest wall AICD are stable and well positioned. Lungs show vascular congestion, interstitial thickening and mild hazy airspace opacity accentuated by low lung volumes respiratory motion. A No convincing pleural effusion.  No pneumothorax.  IMPRESSION: 1. Lung aeration appears worsened from the previous day's study consistent with the development of pulmonary edema. 2. Stable changes from cardiac surgery with placement a left ventricular assist device. Support apparatus is stable and well positioned. 3. No pneumothorax.  No mediastinal widening. Electronically Signed   By: Lajean Manes M.D.   On: 09/07/2015 11:05      Medications:     Scheduled Medications: . sodium chloride   Intravenous Once  . sodium chloride   Intravenous Once  . allopurinol  300 mg Oral Daily  . amiodarone  100 mg Oral Daily  . cefTRIAXone (ROCEPHIN)  IV  2 g Intravenous Q24H  . citalopram  20 mg Oral Daily  . docusate sodium  200 mg Oral Daily  . feeding supplement (NEPRO CARB STEADY)  1,000 mL Per Tube Q24H  . feeding supplement (NEPRO CARB STEADY)  237 mL Per Tube TID WC  . feeding supplement (PRO-STAT SUGAR FREE 64)  30 mL Per Tube TID  . ferrous sulfate  325 mg Oral BID WC  . guaiFENesin  600 mg Oral BID  . metoCLOPramide (REGLAN) injection  5 mg Intravenous Q6H  . pantoprazole  40 mg Oral Daily  . pregabalin  75 mg Oral BID  . rifampin  300 mg Oral Q8H  . simethicone  80 mg Oral QID  . sodium chloride flush  10-40 mL Intracatheter Q12H  . sodium chloride flush  3 mL Intravenous Q12H  . tamsulosin  0.4 mg Oral Daily  . vancomycin  125 mg Oral QID     Infusions: . heparin 1,000 Units/hr (09/08/15 0700)  . norepinephrine (LEVOPHED) Adult infusion Stopped (09/05/15 1553)  . vasopressin (PITRESSIN) infusion - *FOR SHOCK* Stopped (09/05/15 2200)     PRN Medications:  sodium chloride, sodium chloride, sodium chloride, alteplase, bisacodyl **OR** bisacodyl, fentaNYL (SUBLIMAZE) injection, gelatin adsorbable, heparin, ondansetron, oxyCODONE, sodium chloride flush, sodium chloride flush, traMADol   Assessment / Plan/Discussion:    He is hemodynamically stable with stable pump parameters and no PI events. MAP and CVP a little higher  than ideal and PI is higher than his usual. He would probably benefit from removal of some volume.  Acute blood loss anemia related to some bleeding from the sternal wound  and some oozing around catheters  but seems better. The dressing looks fairly dry this am. Will continue heparin at 1000 units per hr since heparin levels undetectable and plan to stop tomorrow am for OR. LDH remains low. Will transfuse a unit of PRBC's today in anticipation of surgery tomorrow.  Acute renal failure due to ATN: dialysis tomorrow after OR. Hopeful for recovery  Tolerating tube feeds at goal of 45.   Continue wound dressing changes and plan placement of external closure device in the OR Monday.       I reviewed the LVAD parameters from today, and compared the results to the patient's prior recorded data.  No programming changes were made.  The LVAD is functioning within specified parameters.  The patient performs LVAD self-test daily.  LVAD interrogation was negative for any significant power changes, alarms or PI events/speed drops.  LVAD equipment check completed and is in good working order.  Back-up equipment present.   LVAD education done on emergency procedures and precautions and reviewed exit site care.  Length of Stay: [place] 09/08/2015, 11:17 AM

## 2015-09-09 ENCOUNTER — Inpatient Hospital Stay (HOSPITAL_COMMUNITY): Payer: 59

## 2015-09-09 ENCOUNTER — Encounter (HOSPITAL_COMMUNITY): Payer: Self-pay | Admitting: Anesthesiology

## 2015-09-09 ENCOUNTER — Inpatient Hospital Stay (HOSPITAL_COMMUNITY): Payer: 59 | Admitting: Anesthesiology

## 2015-09-09 ENCOUNTER — Encounter (HOSPITAL_COMMUNITY)
Admission: AD | Disposition: A | Discharge status: Rehabilitation Facility | Payer: Self-pay | Source: Ambulatory Visit | Attending: Internal Medicine

## 2015-09-09 HISTORY — PX: PECTORALIS FLAP: SHX6228

## 2015-09-09 LAB — POCT I-STAT 3, ART BLOOD GAS (G3+)
ACID-BASE DEFICIT: 5 mmol/L — AB (ref 0.0–2.0)
Bicarbonate: 20.2 mEq/L (ref 20.0–24.0)
O2 SAT: 99 %
PO2 ART: 171 mmHg — AB (ref 80.0–100.0)
TCO2: 21 mmol/L (ref 0–100)
pCO2 arterial: 38 mmHg (ref 35.0–45.0)
pH, Arterial: 7.332 — ABNORMAL LOW (ref 7.350–7.450)

## 2015-09-09 LAB — TYPE AND SCREEN
ABO/RH(D): O POS
Antibody Screen: NEGATIVE
Unit division: 0
Unit division: 0
Unit division: 0
Unit division: 0
Unit division: 0
Unit division: 0
Unit division: 0

## 2015-09-09 LAB — CBC
HCT: 21.8 % — ABNORMAL LOW (ref 39.0–52.0)
HCT: 27.4 % — ABNORMAL LOW (ref 39.0–52.0)
Hemoglobin: 7.6 g/dL — ABNORMAL LOW (ref 13.0–17.0)
Hemoglobin: 9.2 g/dL — ABNORMAL LOW (ref 13.0–17.0)
MCH: 25.7 pg — ABNORMAL LOW (ref 26.0–34.0)
MCH: 26.3 pg (ref 26.0–34.0)
MCHC: 34.9 g/dL (ref 30.0–36.0)
MCHC: 33.6 g/dL (ref 30.0–36.0)
MCV: 73.6 fL — ABNORMAL LOW (ref 78.0–100.0)
MCV: 78.3 fL (ref 78.0–100.0)
Platelets: 152 10*3/uL (ref 150–400)
Platelets: 160 10*3/uL (ref 150–400)
RBC: 2.96 MIL/uL — ABNORMAL LOW (ref 4.22–5.81)
RBC: 3.5 MIL/uL — ABNORMAL LOW (ref 4.22–5.81)
RDW: 23.4 % — ABNORMAL HIGH (ref 11.5–15.5)
RDW: 22.1 % — ABNORMAL HIGH (ref 11.5–15.5)
WBC: 14.1 10*3/uL — ABNORMAL HIGH (ref 4.0–10.5)
WBC: 14.9 10*3/uL — ABNORMAL HIGH (ref 4.0–10.5)

## 2015-09-09 LAB — GLUCOSE, CAPILLARY
GLUCOSE-CAPILLARY: 105 mg/dL — AB (ref 65–99)
GLUCOSE-CAPILLARY: 122 mg/dL — AB (ref 65–99)
GLUCOSE-CAPILLARY: 86 mg/dL (ref 65–99)
Glucose-Capillary: 91 mg/dL (ref 65–99)
Glucose-Capillary: 81 mg/dL (ref 65–99)
Glucose-Capillary: 71 mg/dL (ref 65–99)

## 2015-09-09 LAB — POCT I-STAT, CHEM 8
BUN: 26 mg/dL — AB (ref 6–20)
CREATININE: 3.9 mg/dL — AB (ref 0.61–1.24)
Calcium, Ion: 1.04 mmol/L — ABNORMAL LOW (ref 1.12–1.23)
Chloride: 100 mmol/L — ABNORMAL LOW (ref 101–111)
GLUCOSE: 82 mg/dL (ref 65–99)
HEMATOCRIT: 33 % — AB (ref 39.0–52.0)
Hemoglobin: 11.2 g/dL — ABNORMAL LOW (ref 13.0–17.0)
POTASSIUM: 3.4 mmol/L — AB (ref 3.5–5.1)
Sodium: 139 mmol/L (ref 135–145)
TCO2: 26 mmol/L (ref 0–100)

## 2015-09-09 LAB — PREPARE RBC (CROSSMATCH)

## 2015-09-09 LAB — CARBOXYHEMOGLOBIN
CARBOXYHEMOGLOBIN: 2.1 % — AB (ref 0.5–1.5)
Methemoglobin: 1.4 % (ref 0.0–1.5)
O2 SAT: 64.3 %
TOTAL HEMOGLOBIN: 8.1 g/dL — AB (ref 13.5–18.0)

## 2015-09-09 LAB — HEPARIN LEVEL (UNFRACTIONATED): Heparin Unfractionated: <0.1 IU/mL — ABNORMAL LOW (ref 0.30–0.70)

## 2015-09-09 LAB — LACTATE DEHYDROGENASE: LDH: 249 U/L — AB (ref 98–192)

## 2015-09-09 SURGERY — ADVANCEMENT, FLAP, PECTORALIS
Anesthesia: General | Site: Chest

## 2015-09-09 MED ORDER — ONDANSETRON HCL 4 MG/2ML IJ SOLN: INTRAMUSCULAR | Status: DC | PRN

## 2015-09-09 MED ORDER — NOREPINEPHRINE BITARTRATE 1 MG/ML IV SOLN
4000.0000 ug | INTRAVENOUS | Status: DC | PRN
  Administered 2015-09-09: 4 ug/min via INTRAVENOUS

## 2015-09-09 MED ORDER — SODIUM CHLORIDE 0.9 % IR SOLN
Status: DC | PRN
  Administered 2015-09-09: 1000 mL

## 2015-09-09 MED ORDER — PANCRELIPASE (LIP-PROT-AMYL) 12000-38000 UNITS PO CPEP
2.0000 | ORAL_CAPSULE | Freq: Once | ORAL | Status: AC
  Administered 2015-09-09: 24000 [IU] via ORAL
  Filled 2015-09-09: qty 2

## 2015-09-09 MED ORDER — CEFAZOLIN SODIUM 1-5 GM-% IV SOLN
1.0000 g | INTRAVENOUS | Status: DC
  Administered 2015-09-09 – 2015-09-21 (×12): 1 g via INTRAVENOUS
  Filled 2015-09-09 (×16): qty 50

## 2015-09-09 MED ORDER — LIDOCAINE 2% (20 MG/ML) 5 ML SYRINGE
INTRAMUSCULAR | Status: AC
  Filled 2015-09-09: qty 5

## 2015-09-09 MED ORDER — CEFAZOLIN SODIUM 1-5 GM-% IV SOLN
1.0000 g | Freq: Three times a day (TID) | INTRAVENOUS | Status: DC
  Administered 2015-09-09: 1 g via INTRAVENOUS
  Filled 2015-09-09 (×3): qty 50

## 2015-09-09 MED ORDER — NOREPINEPHRINE BITARTRATE 1 MG/ML IV SOLN: 4000.0000 ug | INTRAVENOUS | Status: DC | PRN

## 2015-09-09 MED ORDER — LIDOCAINE HCL (CARDIAC) 20 MG/ML IV SOLN
INTRAVENOUS | Status: DC | PRN
  Administered 2015-09-09: 60 mg via INTRAVENOUS

## 2015-09-09 MED ORDER — HEPARIN (PORCINE) IN NACL 100-0.45 UNIT/ML-% IJ SOLN
1000.0000 [IU]/h | INTRAMUSCULAR | Status: DC
  Administered 2015-09-09: 900 [IU]/h via INTRAVENOUS
  Filled 2015-09-09: qty 250

## 2015-09-09 MED ORDER — SODIUM CHLORIDE 0.9 % IV SOLN: Freq: Once | INTRAVENOUS | Status: DC

## 2015-09-09 MED ORDER — PROPOFOL 10 MG/ML IV BOLUS
INTRAVENOUS | Status: AC
  Filled 2015-09-09: qty 20

## 2015-09-09 MED ORDER — PHENYLEPHRINE HCL 10 MG/ML IJ SOLN
INTRAMUSCULAR | Status: DC | PRN
  Administered 2015-09-09 (×2): 40 ug via INTRAVENOUS
  Administered 2015-09-09: 80 ug via INTRAVENOUS
  Administered 2015-09-09: 40 ug via INTRAVENOUS
  Administered 2015-09-09: 80 ug via INTRAVENOUS
  Administered 2015-09-09: 40 ug via INTRAVENOUS
  Administered 2015-09-09: 80 ug via INTRAVENOUS

## 2015-09-09 MED ORDER — MAGIC MOUTHWASH
10.0000 mL | Freq: Three times a day (TID) | ORAL | Status: DC
  Administered 2015-09-09 – 2015-09-22 (×23): 10 mL via ORAL
  Filled 2015-09-09 (×46): qty 10

## 2015-09-09 MED ORDER — ALBUMIN HUMAN 5 % IV SOLN
INTRAVENOUS | Status: DC | PRN
  Administered 2015-09-09: 10:00:00 via INTRAVENOUS

## 2015-09-09 MED ORDER — FENTANYL CITRATE (PF) 100 MCG/2ML IJ SOLN
INTRAMUSCULAR | Status: DC | PRN
  Administered 2015-09-09 (×2): 50 ug via INTRAVENOUS

## 2015-09-09 MED ORDER — FENTANYL CITRATE (PF) 250 MCG/5ML IJ SOLN
INTRAMUSCULAR | Status: AC
  Filled 2015-09-09: qty 5

## 2015-09-09 MED ORDER — SODIUM BICARBONATE 650 MG PO TABS
650.0000 mg | ORAL_TABLET | Freq: Once | ORAL | Status: AC
  Administered 2015-09-09: 650 mg via ORAL
  Filled 2015-09-09: qty 1

## 2015-09-09 MED ORDER — VASOPRESSIN 20 UNIT/ML IV SOLN
INTRAVENOUS | Status: DC | PRN
  Administered 2015-09-09 (×2): 2 [IU] via INTRAVENOUS
  Administered 2015-09-09: 1 [IU] via INTRAVENOUS
  Administered 2015-09-09: 2 [IU] via INTRAVENOUS
  Administered 2015-09-09: 1 [IU] via INTRAVENOUS
  Administered 2015-09-09: 3 [IU] via INTRAVENOUS
  Administered 2015-09-09: 2 [IU] via INTRAVENOUS

## 2015-09-09 MED ORDER — ONDANSETRON HCL 4 MG/2ML IJ SOLN
INTRAMUSCULAR | Status: DC | PRN
  Administered 2015-09-09: 4 mg via INTRAVENOUS

## 2015-09-09 MED ORDER — ROCURONIUM BROMIDE 50 MG/5ML IV SOLN
INTRAVENOUS | Status: AC
  Filled 2015-09-09: qty 1

## 2015-09-09 MED ORDER — NOREPINEPHRINE BITARTRATE 1 MG/ML IV SOLN
0.0000 ug/min | INTRAVENOUS | Status: DC
  Filled 2015-09-09: qty 4

## 2015-09-09 MED ORDER — BUPIVACAINE HCL (PF) 0.25 % IJ SOLN
INTRAMUSCULAR | Status: AC
  Filled 2015-09-09: qty 30

## 2015-09-09 MED ORDER — SUCCINYLCHOLINE CHLORIDE 20 MG/ML IJ SOLN
INTRAMUSCULAR | Status: DC | PRN
  Administered 2015-09-09: 100 mg via INTRAVENOUS

## 2015-09-09 MED ORDER — CISATRACURIUM BESYLATE 20 MG/10ML IV SOLN
INTRAVENOUS | Status: AC
  Filled 2015-09-09: qty 10

## 2015-09-09 MED ORDER — EVICEL 5 ML EX KIT
PACK | CUTANEOUS | Status: AC
  Filled 2015-09-09: qty 2

## 2015-09-09 MED ORDER — FENTANYL CITRATE (PF) 100 MCG/2ML IJ SOLN
25.0000 ug | INTRAMUSCULAR | Status: DC | PRN
  Administered 2015-09-10 – 2015-09-20 (×7): 25 ug via INTRAVENOUS
  Filled 2015-09-09 (×5): qty 2

## 2015-09-09 MED ORDER — ETOMIDATE 2 MG/ML IV SOLN
INTRAVENOUS | Status: DC | PRN
  Administered 2015-09-09: 12 mg via INTRAVENOUS

## 2015-09-09 MED ORDER — MIDAZOLAM HCL 2 MG/2ML IJ SOLN
INTRAMUSCULAR | Status: AC
  Filled 2015-09-09: qty 2

## 2015-09-09 SURGICAL SUPPLY — 71 items
ABRA ADHESIVE SKIN CLOSURE SET PARTS ×6 IMPLANT
APPLIER CLIP 9.375 MED OPEN (MISCELLANEOUS)
ATCH SMKEVC FLXB CAUT HNDSWH (FILTER) ×1 IMPLANT
BAG DECANTER FOR FLEXI CONT (MISCELLANEOUS) ×4 IMPLANT
BIOPATCH RED 1 DISK 7.0 (GAUZE/BANDAGES/DRESSINGS) ×4 IMPLANT
BLADE 10 SAFETY STRL DISP (BLADE) ×2 IMPLANT
BLADE SURG 15 STRL LF DISP TIS (BLADE) ×1 IMPLANT
BLADE SURG 15 STRL SS (BLADE) ×1
CANISTER SUCTION 2500CC (MISCELLANEOUS) ×2 IMPLANT
CHLORAPREP W/TINT 26ML (MISCELLANEOUS) ×2 IMPLANT
CLIP APPLIE 9.375 MED OPEN (MISCELLANEOUS) IMPLANT
CONT SPEC 4OZ CLIKSEAL STRL BL (MISCELLANEOUS) ×6 IMPLANT
COVER SURGICAL LIGHT HANDLE (MISCELLANEOUS) ×2 IMPLANT
DRAIN CHANNEL 19F RND (DRAIN) IMPLANT
DRAPE INCISE 23X17 IOBAN STRL (DRAPES) ×1
DRAPE INCISE IOBAN 23X17 STRL (DRAPES) ×1 IMPLANT
DRAPE ORTHO SPLIT 77X108 STRL (DRAPES) ×2
DRAPE PROXIMA HALF (DRAPES) IMPLANT
DRAPE SURG 17X23 STRL (DRAPES) ×8 IMPLANT
DRAPE SURG ORHT 6 SPLT 77X108 (DRAPES) ×2 IMPLANT
DRAPE WARM FLUID 44X44 (DRAPE) ×2 IMPLANT
DRSG CUTIMED SORBACT 7X9 (GAUZE/BANDAGES/DRESSINGS) ×2 IMPLANT
DRSG PAD ABDOMINAL 8X10 ST (GAUZE/BANDAGES/DRESSINGS) ×6 IMPLANT
DRSG SORBAVIEW 3.5X5-5/16 MED (GAUZE/BANDAGES/DRESSINGS) ×2 IMPLANT
ELECT BLADE 6.5 EXT (BLADE) IMPLANT
ELECT CAUTERY BLADE 6.4 (BLADE) ×2 IMPLANT
ELECT REM PT RETURN 9FT ADLT (ELECTROSURGICAL) ×2
ELECTRODE REM PT RTRN 9FT ADLT (ELECTROSURGICAL) ×1 IMPLANT
EVACUATOR SILICONE 100CC (DRAIN) IMPLANT
EVACUATOR SMOKE ACCUVAC VALLEY (FILTER) ×1
GAUZE SPONGE 4X4 12PLY STRL (GAUZE/BANDAGES/DRESSINGS) ×2 IMPLANT
GAUZE XEROFORM 5X9 LF (GAUZE/BANDAGES/DRESSINGS) IMPLANT
GLOVE BIO SURGEON STRL SZ 6.5 (GLOVE) ×8 IMPLANT
GLOVE BIO SURGEON STRL SZ7.5 (GLOVE) ×2 IMPLANT
GLOVE BIOGEL PI IND STRL 6.5 (GLOVE) ×2 IMPLANT
GLOVE BIOGEL PI INDICATOR 6.5 (GLOVE) ×2
GOWN PREVENTION PLUS XLARGE (GOWN DISPOSABLE) ×2 IMPLANT
GOWN STRL REUS W/ TWL LRG LVL3 (GOWN DISPOSABLE) ×2 IMPLANT
GOWN STRL REUS W/TWL LRG LVL3 (GOWN DISPOSABLE) ×2
KIT BASIN OR (CUSTOM PROCEDURE TRAY) ×2 IMPLANT
KIT ROOM TURNOVER OR (KITS) ×2 IMPLANT
MARKER SKIN DUAL TIP RULER LAB (MISCELLANEOUS) ×2 IMPLANT
MATRIX SURGICAL PSM 5X5CM (Tissue) ×2 IMPLANT
MICROMATRIX 1000MG (Tissue) ×2 IMPLANT
NS IRRIG 1000ML POUR BTL (IV SOLUTION) ×4 IMPLANT
PACK GENERAL/GYN (CUSTOM PROCEDURE TRAY) ×2 IMPLANT
PAD ABD 8X10 STRL (GAUZE/BANDAGES/DRESSINGS) ×2 IMPLANT
PAD ARMBOARD 7.5X6 YLW CONV (MISCELLANEOUS) ×4 IMPLANT
PENCIL BUTTON HOLSTER BLD 10FT (ELECTRODE) ×2 IMPLANT
PREFILTER EVAC NS 1 1/3-3/8IN (MISCELLANEOUS) ×2 IMPLANT
SET ADHESIVE SKIN CLSR ABRA (MISCELLANEOUS) ×2 IMPLANT
SOLUTION PARTIC MCRMTRX 1000MG (Tissue) ×1 IMPLANT
SPONGE GAUZE 4X4 12PLY STER LF (GAUZE/BANDAGES/DRESSINGS) ×2 IMPLANT
SPONGE LAP 18X18 X RAY DECT (DISPOSABLE) IMPLANT
STAPLER VISISTAT 35W (STAPLE) ×2 IMPLANT
STRIP CLOSURE SKIN 1/2X4 (GAUZE/BANDAGES/DRESSINGS) ×2 IMPLANT
SUT MNCRL AB 3-0 PS2 18 (SUTURE) IMPLANT
SUT MON AB 2-0 CT1 36 (SUTURE) IMPLANT
SUT PDS AB 0 CT 36 (SUTURE) IMPLANT
SUT PROLENE 3 0 PS 1 (SUTURE) IMPLANT
SUT SILK 2 0 SH (SUTURE) ×2 IMPLANT
SUT SILK 4 0 PS 2 (SUTURE) ×2 IMPLANT
SUT VIC AB 3-0 FS2 27 (SUTURE) IMPLANT
SUT VIC AB 3-0 SH 8-18 (SUTURE) IMPLANT
SYR 50ML SLIP (SYRINGE) IMPLANT
SYR BULB IRRIGATION 50ML (SYRINGE) ×2 IMPLANT
TAPE UMBILICAL COTTON 1/8X30 (MISCELLANEOUS) ×4 IMPLANT
TOWEL OR 17X24 6PK STRL BLUE (TOWEL DISPOSABLE) ×2 IMPLANT
TOWEL OR 17X26 10 PK STRL BLUE (TOWEL DISPOSABLE) ×2 IMPLANT
TRAY FOLEY CATH 14FRSI W/METER (CATHETERS) ×2 IMPLANT
TUBE CONNECTING 12X1/4 (SUCTIONS) ×2 IMPLANT

## 2015-09-09 NOTE — Op Note (Signed)
Operative Note   DATE OF OPERATION: 09/05/2015  LOCATION: [facility] In[facility]  SURGICAL DIVISION: Plastic Surgery  PREOPERATIVE DIAGNOSES:  Chest / Sternal wound 3 x 8 cm  POSTOPERATIVE DIAGNOSES:  same  PROCEDURE:  Preparation of sternal wound for placement of Acell (1 gm and 5 x 5 cm sheet) and VAC placement  SURGEON: Aysiah Jurado Sanger Sahian Kerney, DO  ASSISTANT: Shawn Rayburn, PA  ANESTHESIA:  General.   COMPLICATIONS: None.   INDICATIONS FOR PROCEDURE:  The patient, [name] is a 45 y.o. male born on 06-08-1970, is here for treatment of a sternal wound after a LVAD placement. MRN: [id number]  CONSENT:  Informed consent was obtained directly from the patient. Risks, benefits and alternatives were fully discussed. Specific risks including but not limited to bleeding, infection, hematoma, seroma, scarring, pain, infection, contracture, asymmetry, wound healing problems, and need for further surgery were all discussed. The patient did have an ample opportunity to have questions answered to satisfaction.   DESCRIPTION OF PROCEDURE:  The patient was taken to the operating room. SCDs were placed. The patient's operative site was prepped and draped in a sterile fashion. A time out was performed and all information was confirmed to be correct.  General anesthesia was administered.  The area was irrigated with antibiotic solution and saline to the 3 x 8 cm wound.  All of the Acell powder and sheet was used.  A Sorbact was applied.  KY gel and stay sutures were placed to help initiate closure.  The Monocryl 3-0 was used with vertical mattress sutures.  The VAC was applied and there was an excellent seal.  The patient tolerated the procedure well.  There were no complications. The patient was allowed to wake from anesthesia, extubated and taken to the recovery room in satisfactory condition.

## 2015-09-09 NOTE — Brief Op Note (Signed)
08/26/2015 - 09/09/2015  10:14 AM  PATIENT:  [name]  45 y.o. male  PRE-OPERATIVE DIAGNOSIS:  chest wound  POST-OPERATIVE DIAGNOSIS:  * No post-op diagnosis entered *  PROCEDURE:  Procedure(s): DEBRIDEMENT AND CLOSURE WOUND WITH PLACEMENT OF ABRA CLOSURE DEVICE (N/A)  SURGEON:  Surgeon(s) and Role:    * Wallace Going, DO - Primary    * Ivin Poot, MD - Assisting  PHYSICIAN ASSISTANT: Shawn Rayburn, PA  ASSISTANTS: none   ANESTHESIA:   general  EBL:  Total I/O In: 6.4 [I.V.:6.4] Out: -   BLOOD ADMINISTERED:none  DRAINS: none   LOCAL MEDICATIONS USED:  NONE  SPECIMEN:  No Specimen  DISPOSITION OF SPECIMEN:  N/A  COUNTS:  YES  TOURNIQUET:  * No tourniquets in log *  DICTATION: .Dragon Dictation  PLAN OF CARE: Admit to inpatient   PATIENT DISPOSITION:  PACU - hemodynamically stable.   Delay start of Pharmacological VTE agent (>24hrs) due to surgical blood loss or risk of bleeding: no

## 2015-09-09 NOTE — Progress Notes (Signed)
Alarms noted on Pt's monitor for "ABP disconnect", upon further inspection it was noted that the Pt had inadvertently pulled out the aline. Immediate pressure held at site x 15 minutes then a pressure dressing was applied. Pt gown and bedding changed and Dr Cyndia Bent called to notify of loss of aline. Instructed to leave out for now, to be replaced in the O.R. In A.M. BP cuff applied to Lt thigh for monitoring of SBP. Will continue to monitor and observe

## 2015-09-09 NOTE — Progress Notes (Signed)
ANTICOAGULATION CONSULT NOTE - Follow Up Consult  Pharmacy Consult for Heparin Indication: LvAD  Allergies  Allergen Reactions  . Phytonadione Other (See Comments)    Patient has LVAD: please check with LVAD coordinator on call or LVAD MD on call before reversal of anticoagulation with vit k    Patient Measurements: Height: 5\' 7"  (170.2 cm) Weight: 187 lb 6.3 oz (85 kg) IBW/kg (Calculated) : 66.1 Heparin Dosing Weight:  85 kg  Vital Signs: Temp: 98.3 F (36.8 C) (05/22 0000) Temp Source: Oral (05/22 0000) BP: 111/94 mmHg (05/22 0500) Pulse Rate: 77 (05/22 0500)  Labs:  Recent Labs  09/07/15 0400 09/07/15 0416 09/07/15 1952 09/08/15 0357 09/08/15 0358 09/08/15 1201 09/09/15 0400  HGB 7.1*  --  8.6*  --  7.9*  --   --   HCT 20.9*  --  25.2*  --  23.4*  --   --   PLT 181  --  157  --  174  --   --   HEPARINUNFRC  --  <0.10*  --  <0.10*  --   --  <0.10*  CREATININE 7.49*  --   --   --   --  7.03*  --     Estimated Creatinine Clearance: 13.5 mL/min (by C-G formula based on Cr of 7.03).  Assessment: 45yom on coumadin pta for his LVAD, admitted with driveline abscess, sternal wound infection, and MSSA bacteremia. Home Coumadin held, and heparin bridge started 5/10 pending OR trips for wound debridement.   Heparin remains <0.1 and bleeding continues to be an issue so MD has asked that rate not be increased. Will need to discuss rate adjustment and recommendations with the MD. Heparin to be turned off today at 0700 as pt going back to OR for wound vac change.   Goal of Therapy:  HL 0.2-0.3 Monitor platelets by anticoagulation protocol: Yes   Plan:  Continue IV heparin at 1000 units/hr - off at 0700 Will f/u restart heparin post OR  Sherlon Handing, PharmD, BCPS Clinical pharmacist, pager [phone]  09/09/2015 5:21 AM

## 2015-09-09 NOTE — Transfer of Care (Signed)
Immediate Anesthesia Transfer of Care Note  Patient: [name]  Procedure(s) Performed: Procedure(s): DEBRIDEMENT AND CLOSURE WOUND WITH PLACEMENT OF ABRA CLOSURE DEVICE (N/A)  Patient Location: PACU  Anesthesia Type:General  Level of Consciousness: awake, oriented and patient cooperative  Airway & Oxygen Therapy: Patient Spontanous Breathing and Patient connected to face mask oxygen  Post-op Assessment: Report given to RN and Post -op Vital signs reviewed and stable  Post vital signs: Reviewed  Last Vitals:  Filed Vitals:   09/09/15 0745 09/09/15 0800  BP:  155/106  Pulse: 95   Temp:    Resp: 19     Last Pain:  Filed Vitals:   09/09/15 0806  PainSc: 0-No pain      Patients Stated Pain Goal: 2 ([id number])  Complications: No apparent anesthesia complications

## 2015-09-09 NOTE — Procedures (Signed)
Patient seen on Hemodialysis. QB 350, UF goal 3.5L Treatment adjusted as needed.  Elmarie Shiley MD [facility]. Office # [phone] Pager # [phone] 2:49 PM

## 2015-09-09 NOTE — Op Note (Signed)
Operative Note   DATE OF OPERATION: 09/09/2015  LOCATION: [facility]  SURGICAL DIVISION: Plastic Surgery  PREOPERATIVE DIAGNOSES:  Chest / Sternal wound 3 x 8 cm  POSTOPERATIVE DIAGNOSES:  same  PROCEDURE:  Preparation of sternal wound for placement of Acell (1 gm and 5 x 5 cm sheet) and ABRA device for closure of 3 x 8 cm wound  SURGEON: Claire Sanger Dillingham, DO  ASSISTANT: Dr. Dahlia Byes, hawn Rayburn, PA  ANESTHESIA:  General.   COMPLICATIONS: None.   INDICATIONS FOR PROCEDURE:  The patient, [name] is a 45 y.o. male born on 12/29/1970, is here for treatment of a sternal wound after a LVAD placement. MRN: [id number]  CONSENT:  Informed consent was obtained directly from the patient. Risks, benefits and alternatives were fully discussed. Specific risks including but not limited to bleeding, infection, hematoma, seroma, scarring, pain, infection, contracture, asymmetry, wound healing problems, and need for further surgery were all discussed. The patient did have an ample opportunity to have questions answered to satisfaction.   DESCRIPTION OF PROCEDURE:  The patient was taken to the operating room. SCDs were placed. The patient's operative site was prepped and draped in a sterile fashion. A time out was performed and all information was confirmed to be correct.  General anesthesia was administered.  The area was irrigated with antibiotic solution and saline to the 3 x 8 cm wound.  All of the Acell powder and sheet was used.  A Sorbact was applied.  KY gel and gauze was applied.  The ABRA device was applied with three strips on each side and the tubing between them.  The patient tolerated the procedure well.  There were no complications. The patient was allowed to wake from anesthesia, extubated and taken to the recovery room in satisfactory condition.

## 2015-09-09 NOTE — Interval H&P Note (Signed)
History and Physical Interval Note:  09/09/2015 9:04 AM  [name]  has presented today for surgery, with the diagnosis of chest wound  The various methods of treatment have been discussed with the patient and family. After consideration of risks, benefits and other options for treatment, the patient has consented to  Procedure(s): DEBRIDEMENT AND CLOSURE WOUND WITH PLACEMENT OF [place] (N/A) as a surgical intervention .  The patient's history has been reviewed, patient examined, no change in status, stable for surgery.  I have reviewed the patient's chart and labs.  Questions were answered to the patient's satisfaction.     Wallace Going

## 2015-09-09 NOTE — Progress Notes (Signed)
ANTICOAGULATION CONSULT NOTE - Follow Up Consult  Pharmacy Consult for Heparin Indication: LvAD  Allergies  Allergen Reactions  . Phytonadione Other (See Comments)    Patient has LVAD: please check with LVAD coordinator on call or LVAD MD on call before reversal of anticoagulation with vit k    Patient Measurements: Height: 5\' 7"  (170.2 cm) Weight: 191 lb 5.8 oz (86.8 kg) IBW/kg (Calculated) : 66.1 Heparin Dosing Weight:  85 kg  Vital Signs: BP: 155/106 mmHg (05/22 0800) Pulse Rate: 94 (05/22 1215)  Labs:  Recent Labs  09/07/15 0400 09/07/15 0416  09/08/15 0357 09/08/15 0358 09/08/15 1201 09/09/15 0400 09/09/15 0440 09/09/15 1158  HGB 7.1*  --   < >  --  7.9*  --   --  7.6* 9.2*  HCT 20.9*  --   < >  --  23.4*  --   --  21.8* 27.4*  PLT 181  --   < >  --  174  --   --  152 160  HEPARINUNFRC  --  <0.10*  --  <0.10*  --   --  <0.10*  --   --   CREATININE 7.49*  --   --   --   --  7.03*  --   --   --   < > = values in this interval not displayed.  Estimated Creatinine Clearance: 13.7 mL/min (by C-G formula based on Cr of 7.03).  Assessment: 76 yom on Coumadin pta for his LVAD, admitted with driveline abscess, sternal wound infection, and MSSA bacteremia. Home Coumadin held, and heparin bridge started 5/10 pending OR trips for wound debridement.   Heparin remains < 0.1 and bleeding continues to be an issue so MD has asked that rate not be increased. Will need to discuss rate adjustment and recommendations with the MD. Heparin to be turned off today at 0700 as pt going back to OR for wound vac change.   Heparin to resume at 900 units/hr at 5 pm today per Dr. Lucianne Lei Trigt's order.  Heparin levels have been undetectable on heparin at 1000 units/hr.  Goal of Therapy:  HL 0.2-0.3 Monitor platelets by anticoagulation protocol: Yes   Plan:  Resume IV heparin at 900 units/hr at 1700 pm. Continue daily heparin levels for now. Daily CBC. F/u plans to resume Coumadin  eventually.  Uvaldo Rising, BCPS  Clinical Pharmacist Pager [phone]  09/09/2015 12:39 PM

## 2015-09-09 NOTE — Progress Notes (Signed)
Patient ID: [name], male   DOB: 06-18-1970, 45 y.o.   MRN: [id number]    HeartMate 2 Rounding Note  Subjective:    Admitted from [facility] in [place] with driveline abscess. Blood cultures --> MSSA and has C diff. Remains on Nafcillin  and oral vancomycin.   08/27/15 S/P I &D Epigastric with hardware exposed and VAC placement.  5/11 repeat I&D and wound vac replacement.  5/11 TEE no vegetations on valves or ICD wire   Yesterday milrinone was restarted 0.125 mcg.  Returned to the OR this morning for ABRA and Acell.  Received 2UPRBCs. On 2 mcg norepi.   Sedated.   LVAD INTERROGATION:  HeartMate II LVAD:  Flow 5.2 liters/min, speed 9200, power 6.0  PI 7.5  No PI events.   Objective:    Vital Signs:   Temp:  [97.8 F (36.6 C)-99 F (37.2 C)] 98.3 F (36.8 C) (05/22 0000) Pulse Rate:  [77-95] 94 (05/22 0700) Resp:  [15-31] 20 (05/22 0700) BP: (109-137)/(78-98) 121/91 mmHg (05/22 0700) SpO2:  [97 %-100 %] 98 % (05/22 0700) Arterial Line BP: (111-134)/(78-89) 134/82 mmHg (05/21 2200) Weight:  [191 lb 5.8 oz (86.8 kg)] 191 lb 5.8 oz (86.8 kg) (05/22 0600) Last BM Date: 09/07/15 Mean arterial Pressure  100s on norepi.     Intake/Output:   Intake/Output Summary (Last 24 hours) at 09/09/15 0733 Last data filed at 09/09/15 0600  Gross per 24 hour  Intake 1935.91 ml  Output    230 ml  Net 1705.91 ml     Physical Exam: CVP 20 General: In bed. Sedated  HEENT: Scleral icterus Neck: supple.  JVP jaw Carotids 2+ bilat; no bruits. No lymphadenopathy or thryomegaly appreciated. R subclavian trialysis cath dressing dry Cor: Mechanical heart sounds with LVAD hum present. Lungs: clear on 6 liters mask.  Abdomen: soft, tender, distended. Good bowel sounds. Mid epigastric dressing.   Driveline: C/D/I; securement device intact and driveline incorporated Extremities: no cyanosis, clubbing, rash, 1+ edema R and LLE SCDs.  Neuro: Sedated.  Telemetry: NSR 70s    Labs: Basic Metabolic Panel:  Recent Labs Lab 09/03/15 0431  09/04/15 1512 09/05/15 0450 09/05/15 0805 09/06/15 0415 09/07/15 0400 09/08/15 1201  NA 134*  < > 135 134* 138 138 139 135  K 4.0  < > 3.6 3.7 3.6 3.3* 3.3* 3.0*  CL 102  < > 106 103 105 102 103 102  CO2 18*  < > 15* 16*  --  21* 20* 22  GLUCOSE 107*  < > 106* 99 92 93 82 105*  BUN 17  < > 32* 36* 36* 29* 44* 53*  CREATININE 3.89*  < > 6.10* 7.13* 7.70* 5.90* 7.49* 7.03*  CALCIUM 7.5*  < > 7.8* 7.8*  --  8.1* 8.4* 8.5*  MG 1.8  --   --   --   --   --   --   --   PHOS 6.7*  --   --   --   --   --   --  5.8*  < > = values in this interval not displayed.  Liver Function Tests:  Recent Labs Lab 09/03/15 0431 09/04/15 0411 09/05/15 0450 09/05/15 1827 09/06/15 0415 09/07/15 0400 09/08/15 1201  AST 39 46* 41  --  46* 42*  --   ALT 27 31 20 18 17  8*  --   ALKPHOS 96 99 96  --  109 92  --   BILITOT 11.4* 9.4*  8.9*  --  9.1* 8.5*  --   PROT 6.0* 5.9* 5.9*  --  5.8* 5.7*  --   ALBUMIN 2.1* 1.8* 1.7*  --  1.7* 1.5* 1.4*   No results for input(s): LIPASE, AMYLASE in the last 168 hours.  Recent Labs Lab 09/02/15 1045  AMMONIA 40*    CBC:  Recent Labs Lab 09/03/15 0431  09/06/15 0415 09/07/15 0400 09/07/15 1952 09/08/15 0358 09/09/15 0440  WBC 20.0*  < > 15.4* 13.3* 14.9* 14.3* 14.1*  NEUTROABS 17.8*  --   --   --   --   --   --   HGB 8.5*  < > 8.6* 7.1* 8.6* 7.9* 7.6*  HCT 23.8*  < > 25.2* 20.9* 25.2* 23.4* 21.8*  MCV 69.0*  < > 72.4* 71.6* 75.9* 74.1* 73.6*  PLT 286  < > 175 181 157 174 152  < > = values in this interval not displayed.  INR: No results for input(s): INR in the last 168 hours.  Other results:    Imaging: No results found.   Medications:     Scheduled Medications: . sodium chloride   Intravenous Once  . sodium chloride   Intravenous Once  . sodium chloride   Intravenous Once  . allopurinol  300 mg Oral Daily  . amiodarone  100 mg Oral Daily  .  ceFAZolin (ANCEF)  IV  2 g Intravenous To SS-Surg  . cefTRIAXone (ROCEPHIN)  IV  2 g Intravenous Q24H  . citalopram  20 mg Oral Daily  . docusate sodium  200 mg Oral Daily  . feeding supplement (NEPRO CARB STEADY)  1,000 mL Per Tube Q24H  . feeding supplement (NEPRO CARB STEADY)  237 mL Per Tube TID WC  . feeding supplement (PRO-STAT SUGAR FREE 64)  30 mL Per Tube TID  . ferrous sulfate  325 mg Oral BID WC  . guaiFENesin  600 mg Oral BID  . metoCLOPramide (REGLAN) injection  5 mg Intravenous Q6H  . pantoprazole  40 mg Oral Daily  . pregabalin  75 mg Oral BID  . rifampin  300 mg Oral Q8H  . simethicone  80 mg Oral QID  . sodium chloride flush  10-40 mL Intracatheter Q12H  . sodium chloride flush  3 mL Intravenous Q12H  . tamsulosin  0.4 mg Oral Daily  . vancomycin  125 mg Oral QID    Infusions: . milrinone 0.125 mcg/kg/min (09/09/15 0600)  . norepinephrine (LEVOPHED) Adult infusion Stopped (09/05/15 1553)  . vasopressin (PITRESSIN) infusion - *FOR SHOCK* Stopped (09/05/15 2200)    PRN Medications: sodium chloride, sodium chloride, sodium chloride, alteplase, bisacodyl **OR** bisacodyl, fentaNYL (SUBLIMAZE) injection, gelatin adsorbable, heparin, ondansetron, oxyCODONE, sodium chloride flush, sodium chloride flush, traMADol   Assessment:   1. LVAD Complication- Driveline abscess 2. MSSA bacteremia -> septic shock 3. Acute/Chronic systolic HF s/p VAD placement 9/16 4. C. Difficile colitis 5. 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 Acute blood loss anemia 8. Severe Malnutrition.   9.. ARF 10. Klebsiella (ESBL) pneumonia  Plan/Discussion:    S/P I&D LVAD driveline abscess. Exposed hardware.   Today he returned to the OR for [place] and reapplication of ACell. Intraop he received 2UPRBCs. Weaning Norepi. Remains on milrinone 0.125 mcg.    ID following. Now on ceftriaxone for klebsiella PNA and MSSA. Oral vanc for c.diff. WBC 14.1.   For HD today.   VAD  parameters ok.   The patient is critically  ill with multiple organ systems failure and requires high complexity decision making for assessment and support, frequent evaluation and titration of therapies, application of advanced monitoring technologies and extensive interpretation of multiple databases.   Critical Care Time devoted to patient care services described in this note is 35 Minutes.  Length of Stay: [place] NP-C  09/09/2015, 7:33 AM  VAD Team --- VAD ISSUES ONLY--- Pager [phone] (7am - 7am)  Advanced Heart Failure Team  Pager [phone] (M-F; 7a - 4p)  Please contact [facility] for night-coverage after hours (4p -7a ) and weekends on [email or web address]  Patient seen with NP, agree with the above note.  Back to OR as above.  He is on HD currently.  BP high, can stop norepinephrine.  Co-ox 64% on milrinone 0.125.  Probably can wean off soon.  He is now making about 40 cc urine every 2 hours, hopefully will have renal recovery.    On abx for infections listed above.    Stable VAD parameters.   Loralie Champagne 09/09/2015 1:52 PM

## 2015-09-09 NOTE — Progress Notes (Signed)
HeartMate 2 Rounding Note  Subjective:   Status post HeartMate 2 implant mid 2016 for non-ischemic cardiomyopathy  Infected pump pocket with MSSA bacteremia Dressing changes debridement and wound VAC therapy has been applied previously this admission Today patient was taken to or for external wound closure device-ABRA which will bring skin and subcutaneous tissue together over several days. A cell powder also applied today  Hope to close the abdominal wound with skin sutures in about 1 week.  Patient with systemic sequela of MSSA sepsis, on renal dialysis and when necessary press orders-norepinephrine  No neurologic deficit Ileus with pyloric tube feed in place Continuing blood loss from heparin and bleeding from open wound    LVAD INTERROGATION:  HeartMate II LVAD:  Flow 4.8 liters/min, speed 9200, power 4.9, PI 6.1  Controller in place   Objective:    Vital Signs:   Temp:  [97.8 F (36.6 C)-99 F (37.2 C)] 98.3 F (36.8 C) (05/22 0000) Pulse Rate:  [77-95] 94 (05/22 1200) Resp:  [12-31] 14 (05/22 1200) BP: (109-155)/(78-106) 155/106 mmHg (05/22 0800) SpO2:  [97 %-100 %] 100 % (05/22 1200) Arterial Line BP: (113-153)/(78-89) 137/79 mmHg (05/22 1200) Weight:  [191 lb 5.8 oz (86.8 kg)] 191 lb 5.8 oz (86.8 kg) (05/22 0600) Last BM Date: 09/08/15 Mean arterial Pressure 80-90 mmHg  Intake/Output:   Intake/Output Summary (Last 24 hours) at 09/09/15 1212 Last data filed at 09/09/15 1200  Gross per 24 hour  Intake 2359.11 ml  Output    290 ml  Net 2069.11 ml     Physical Exam: General:  Well appearing. No resp difficulty HEENT: normal Neck: supple. JVP . Carotids ; no bruits. No lymphadenopathy or thryomegaly appreciated. Cor: Mechanical heart sounds with LVAD hum present. Lungs: clear Abdomen: soft, nontender, nondistended. No hepatosplenomegaly. No bruits or masses. Good bowel sounds.Abdominal dressing intact Extremities: no cyanosis, clubbing, rash,  edema Neuro: alert & orientedx3, cranial nerves grossly intact. moves all 4 extremities w/o difficulty. Affect pleasant  Telemetry: Sinus rhythm  Labs: Basic Metabolic Panel:  Recent Labs Lab 09/03/15 0431  09/04/15 1512 09/05/15 0450 09/05/15 0805 09/06/15 0415 09/07/15 0400 09/08/15 1201  NA 134*  < > 135 134* 138 138 139 135  K 4.0  < > 3.6 3.7 3.6 3.3* 3.3* 3.0*  CL 102  < > 106 103 105 102 103 102  CO2 18*  < > 15* 16*  --  21* 20* 22  GLUCOSE 107*  < > 106* 99 92 93 82 105*  BUN 17  < > 32* 36* 36* 29* 44* 53*  CREATININE 3.89*  < > 6.10* 7.13* 7.70* 5.90* 7.49* 7.03*  CALCIUM 7.5*  < > 7.8* 7.8*  --  8.1* 8.4* 8.5*  MG 1.8  --   --   --   --   --   --   --   PHOS 6.7*  --   --   --   --   --   --  5.8*  < > = values in this interval not displayed.  Liver Function Tests:  Recent Labs Lab 09/03/15 0431 09/04/15 0411 09/05/15 0450 09/05/15 1827 09/06/15 0415 09/07/15 0400 09/08/15 1201  AST 39 46* 41  --  46* 42*  --   ALT 27 31 20 18 17  8*  --   ALKPHOS 96 99 96  --  109 92  --   BILITOT 11.4* 9.4* 8.9*  --  9.1* 8.5*  --   PROT 6.0* 5.9*  5.9*  --  5.8* 5.7*  --   ALBUMIN 2.1* 1.8* 1.7*  --  1.7* 1.5* 1.4*   No results for input(s): LIPASE, AMYLASE in the last 168 hours. No results for input(s): AMMONIA in the last 168 hours.  CBC:  Recent Labs Lab 09/03/15 0431  09/06/15 0415 09/07/15 0400 09/07/15 1952 09/08/15 0358 09/09/15 0440  WBC 20.0*  < > 15.4* 13.3* 14.9* 14.3* 14.1*  NEUTROABS 17.8*  --   --   --   --   --   --   HGB 8.5*  < > 8.6* 7.1* 8.6* 7.9* 7.6*  HCT 23.8*  < > 25.2* 20.9* 25.2* 23.4* 21.8*  MCV 69.0*  < > 72.4* 71.6* 75.9* 74.1* 73.6*  PLT 286  < > 175 181 157 174 152  < > = values in this interval not displayed.  INR: No results for input(s): INR in the last 168 hours.  Other results:  EKG:   Imaging: Dg Chest Port 1 View  09/09/2015  CLINICAL DATA:  Left ventricular assist device EXAM: PORTABLE CHEST 1 VIEW  COMPARISON:  09/07/2015 FINDINGS: Left ventricular assist device unchanged in position. AICD unchanged in position. Left atrial appendage clip unchanged. Right subclavian central venous catheter tip at the cavoatrial junction. Feeding tube in place with the tip not visualized. Mild improvement in diffuse bilateral airspace disease most compatible with resolving edema. Improvement in bibasilar atelectasis. No effusion. IMPRESSION: Left ventricular assist device unchanged. Improvement in pulmonary edema. Electronically Signed   By: Franchot Gallo M.D.   On: 09/09/2015 11:54      Medications:     Scheduled Medications: . sodium chloride   Intravenous Once  . allopurinol  300 mg Oral Daily  . amiodarone  100 mg Oral Daily  .  ceFAZolin (ANCEF) IV  1 g Intravenous Q8H  . cefTRIAXone (ROCEPHIN)  IV  2 g Intravenous Q24H  . citalopram  20 mg Oral Daily  . docusate sodium  200 mg Oral Daily  . feeding supplement (NEPRO CARB STEADY)  1,000 mL Per Tube Q24H  . feeding supplement (NEPRO CARB STEADY)  237 mL Per Tube TID WC  . feeding supplement (PRO-STAT SUGAR FREE 64)  30 mL Per Tube TID  . ferrous sulfate  325 mg Oral BID WC  . guaiFENesin  600 mg Oral BID  . magic mouthwash  10 mL Oral TID  . metoCLOPramide (REGLAN) injection  5 mg Intravenous Q6H  . pantoprazole  40 mg Oral Daily  . pregabalin  75 mg Oral BID  . rifampin  300 mg Oral Q8H  . simethicone  80 mg Oral QID  . sodium chloride flush  10-40 mL Intracatheter Q12H  . sodium chloride flush  3 mL Intravenous Q12H  . tamsulosin  0.4 mg Oral Daily  . vancomycin  125 mg Oral QID     Infusions: . heparin    . milrinone 0.125 mcg/kg/min (09/09/15 1200)  . norepinephrine (LEVOPHED) Adult infusion Stopped (09/09/15 1139)  . vasopressin (PITRESSIN) infusion - *FOR SHOCK* Stopped (09/05/15 2200)     PRN Medications:  sodium chloride, sodium chloride, sodium chloride, alteplase, bisacodyl **OR** bisacodyl, fentaNYL (SUBLIMAZE)  injection, gelatin adsorbable, heparin, ondansetron, oxyCODONE, sodium chloride flush, sodium chloride flush, traMADol   Assessment:  MSSA infection of VAD pocket treated with IV antibiotics as well as topical wound therapies Planning for delayed closure of wound with external soft tissue device- ABRA Acute renal failure on hemodialysis Acute blood loss anemia from  open wound and IV heparin for VAD   Plan/Discussion:   Resume heparin 6 hours postop-900 units per hour maximum Encourage nutrition, mobilize out of bed to chair, incentive spirometry Hold Coumadin until final wound closure next week   I reviewed the LVAD parameters from today, and compared the results to the patient's prior recorded data.  No programming changes were made.  The LVAD is functioning within specified parameters.  The patient performs LVAD self-test daily.  LVAD interrogation was negative for any significant power changes, alarms or PI events/speed drops.  LVAD equipment check completed and is in good working order.  Back-up equipment present.   LVAD education done on emergency procedures and precautions and reviewed exit site care.  Length of Stay: [place] [facility] 09/09/2015, 12:12 PM

## 2015-09-09 NOTE — Addendum Note (Signed)
Addendum  created 09/09/15 1155 by Jenne Campus, CRNA   Modules edited: Anesthesia Flowsheet, Anesthesia Medication Administration

## 2015-09-09 NOTE — Progress Notes (Signed)
CSW attempted to visit with patient at bedside although sleeping and receiving dialysis treatment. CSW will follow up in the morning. Raquel Sarna, LCSW [phone]

## 2015-09-09 NOTE — Anesthesia Postprocedure Evaluation (Signed)
Anesthesia Post Note  Patient: KYNDALL MISKIEWICZ  Procedure(s) Performed: Procedure(s) (LRB): DEBRIDEMENT AND CLOSURE WOUND WITH PLACEMENT OF ABRA CLOSURE DEVICE (N/A)  Patient location during evaluation: ICU Anesthesia Type: General Level of consciousness: awake and alert Pain management: pain level controlled Vital Signs Assessment: post-procedure vital signs reviewed and stable Respiratory status: spontaneous breathing, nonlabored ventilation, respiratory function stable and patient connected to nasal cannula oxygen Cardiovascular status: stable Postop Assessment: no signs of nausea or vomiting Anesthetic complications: no    Last Vitals:  Filed Vitals:   09/09/15 1100 09/09/15 1115  BP:    Pulse: 91 91  Temp:    Resp: 19 13    Last Pain:  Filed Vitals:   09/09/15 1115  PainSc: 0-No pain                 Effie Berkshire

## 2015-09-09 NOTE — Progress Notes (Signed)
PT Cancellation Note  Patient Details Name: [name] MRN: [id number] DOB: 22-Aug-1970   Cancelled Treatment:    Reason Eval/Treat Not Completed: Patient at procedure or test/unavailable   Noted patient in surgery this a.m.  Will continue to follow and proceed with PT/activity when appropriate.   Eloisa Chokshi 09/09/2015, 8:52 AM  Pager [phone]

## 2015-09-09 NOTE — Progress Notes (Signed)
[facility] for Infectious Disease   Reason for visit: Follow up on drive line infection, C diff  Interval History:   Afebrile.  Went to wound debridement, acell placement today. continues  Only 2 BM today   Physical Exam: Constitutional:  Filed Vitals:   09/09/15 1615 09/09/15 1630  BP:  106/87  Pulse: 98 96  Temp:    Resp: 17 18   patient appears in NAD Respiratory: Normal respiratory effort; CTA B anterior exam GI: soft, nt MS: no edema Skin: no rashes   Review of Systems: Respiratory: negative for cough Integument/breast: negative for rash  Lab Results  Component Value Date   WBC 14.9* 09/09/2015   HGB 11.2* 09/09/2015   HCT 33.0* 09/09/2015   MCV 78.3 09/09/2015   PLT 160 09/09/2015    Lab Results  Component Value Date   CREATININE 3.90* 09/09/2015   BUN 26* 09/09/2015   NA 139 09/09/2015   K 3.4* 09/09/2015   CL 100* 09/09/2015   CO2 22 09/08/2015    Lab Results  Component Value Date   ALT 8* 09/07/2015   AST 42* 09/07/2015   ALKPHOS 92 09/07/2015     Microbiology: Recent Results (from the past 240 hour(s))  Culture, respiratory (NON-Expectorated)     Status: None   Collection Time: 09/02/15  8:30 AM  Result Value Ref Range Status   Specimen Description TRACHEAL ASPIRATE  Final   Special Requests Normal  Final   Gram Stain   Final    MODERATE WBC PRESENT,BOTH PMN AND MONONUCLEAR FEW SQUAMOUS EPITHELIAL CELLS PRESENT FEW GRAM NEGATIVE RODS Performed at [facility]    Culture   Final    MODERATE KLEBSIELLA PNEUMONIAE Performed at [facility]    Report Status 09/04/2015 FINAL  Final   Organism ID, Bacteria KLEBSIELLA PNEUMONIAE  Final      Susceptibility   Klebsiella pneumoniae - MIC*    AMPICILLIN >=32 RESISTANT Resistant     AMPICILLIN/SULBACTAM 16 INTERMEDIATE Intermediate     CEFEPIME <=1 SENSITIVE Sensitive     CEFTAZIDIME <=1 SENSITIVE Sensitive     CEFTRIAXONE <=1 SENSITIVE Sensitive     CIPROFLOXACIN  <=0.25 SENSITIVE Sensitive     GENTAMICIN <=1 SENSITIVE Sensitive     IMIPENEM <=0.25 SENSITIVE Sensitive     PIP/TAZO 16 SENSITIVE Sensitive     TOBRAMYCIN <=1 SENSITIVE Sensitive     TRIMETH/SULFA Value in next row Sensitive      <=20 SENSITIVE(NOTE)    * MODERATE KLEBSIELLA PNEUMONIAE  Culture, blood (single) w Reflex to ID Panel     Status: None   Collection Time: 09/02/15 10:20 AM  Result Value Ref Range Status   Specimen Description BLOOD LEFT HAND  Final   Special Requests IN PEDIATRIC BOTTLE 1CC  Final   Culture NO GROWTH 5 DAYS  Final   Report Status 09/07/2015 FINAL  Final  Urine culture     Status: Abnormal   Collection Time: 09/02/15  5:30 PM  Result Value Ref Range Status   Specimen Description URINE, CLEAN CATCH  Final   Special Requests NONE  Final   Culture MULTIPLE SPECIES PRESENT, SUGGEST RECOLLECTION (A)  Final   Report Status 09/04/2015 FINAL  Final    Impression/Plan:  1. Drive line infection - will place him  Cefazolin and continue on rifampin 2. Aspiration pneumonia - treated 3.  C diff -  will continue 125mg  QID 4. AKi = continue with renally dosed cefazolin

## 2015-09-09 NOTE — Progress Notes (Signed)
The patient was examined and preop studies reviewed. There has been no change from the prior exam and the patient is ready for surgery.  Plan wound closure- possible muscle flap on A [place] for VAD pockert infection and exposed titanium

## 2015-09-09 NOTE — Brief Op Note (Signed)
08/26/2015 - 09/09/2015  11:02 AM  PATIENT:  [name]  45 y.o. 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  POST-OPERATIVE DIAGNOSIS:  same  PROCEDURE:  Procedure(s): DEBRIDEMENT AND CLOSURE WOUND WITH PLACEMENT OF ABRA CLOSURE DEVICE (N/A), Acell powder placed  SURGEON:  Surgeon(s) and Role:    * Wallace Going, DO - Primary    * Ivin Poot, MD - Assisting  PHYSICIAN ASSISTANT: none  ASSISTANTS: none   ANESTHESIA:   general  EBL:  Total I/O In: 256.4 [I.V.:6.4; IV Piggyback:250] Out: 5 [Blood:5]  BLOOD ADMINISTERED:550 CC PRBC  DRAINS: none   LOCAL MEDICATIONS USED:  NONE  SPECIMEN:  No Specimen  DISPOSITION OF SPECIMEN:  N/A  COUNTS:  YES  TOURNIQUET:  * No tourniquets in log *  DICTATION: .Dragon Dictation  PLAN OF CARE: return to 2 S [11]  PATIENT DISPOSITION:  ICU - extubated and stable.   Delay start of Pharmacological VTE agent (>24hrs) due to surgical blood loss or risk of bleeding: yes

## 2015-09-09 NOTE — Progress Notes (Signed)
Patient ID: [name], male   DOB: 1971-03-24, 45 y.o.   MRN: [id number]  [name] Progress Note   Assessment/ Plan:   1. AKI Suspected to be ATN from sepsis/shock. Continue hemodialysis at this time-able to tolerate this with improved blood pressures/hemodynamic status. We'll monitor daily for dialysis needs given his hypercatabolic state. Unfortunately, without evidence of renal recovery so far and urine output currently in the oliguric range. With baseline renal function being normal-expecting recovery to allow him to get off dialysis. 2. LVAD MSSA driveline infection/infected VAD pocket: On intravenous antibiotic therapy and now status post debridement and initial step towards wound closure. Now on Rifampin. 3. Clostridium difficile colitis: On oral vancomycin 4. Hypokalemia: Likely secondary to gastrointestinal losses from associated colitis-adjust dialysate potassium and replace cautiously via parenteral route. 5. Nonischemic cardiomyopathy/chronic systolic heart failure: supported with LVAD.  Subjective:   Taken to the OR today for debridement and closure of infected VAD pocket/exposed titanium with ABRA closure device. Secondary wound closure in possibly 1 week.    Objective:   BP 114/80 mmHg  Pulse 97  Temp(Src) 98.4 F (36.9 C) (Axillary)  Resp 18  Ht 5\' 7"  (1.702 m)  Wt 89.6 kg (197 lb 8.5 oz)  BMI 30.93 kg/m2  SpO2 100%  Intake/Output Summary (Last 24 hours) at 09/09/15 1438 Last data filed at 09/09/15 1400  Gross per 24 hour  Intake 2015.51 ml  Output    310 ml  Net 1705.51 ml   Weight change: 1 kg (2 lb 3.3 oz)  Physical Exam: [id number] resting in bed, getting hemodialysis CVS: Pulse regular rhythm, normal rate Resp: Poor inspiratory effort-decreased breath sounds bilaterally Abd: Soft, anterior dressings noted Ext: 1+ lower extremity edema  Imaging: Dg Chest Port 1 View  09/09/2015  CLINICAL DATA:  Left ventricular assist device  EXAM: PORTABLE CHEST 1 VIEW COMPARISON:  09/07/2015 FINDINGS: Left ventricular assist device unchanged in position. AICD unchanged in position. Left atrial appendage clip unchanged. Right subclavian central venous catheter tip at the cavoatrial junction. Feeding tube in place with the tip not visualized. Mild improvement in diffuse bilateral airspace disease most compatible with resolving edema. Improvement in bibasilar atelectasis. No effusion. IMPRESSION: Left ventricular assist device unchanged. Improvement in pulmonary edema. Electronically Signed   By: Franchot Gallo M.D.   On: 09/09/2015 11:54    Labs: BMET  Recent Labs Lab 09/03/15 0431 09/03/15 1300 09/04/15 0411 09/04/15 1512 09/05/15 0450 09/05/15 0805 09/06/15 0415 09/07/15 0400 09/08/15 1201  NA 134* 132* 134* 135 134* 138 138 139 135  K 4.0 3.9 3.8 3.6 3.7 3.6 3.3* 3.3* 3.0*  CL 102 102 104 106 103 105 102 103 102  CO2 18* 18* 16* 15* 16*  --  21* 20* 22  GLUCOSE 107* 111* 104* 106* 99 92 93 82 105*  BUN 17 20 27* 32* 36* 36* 29* 44* 53*  CREATININE 3.89* 4.68* 5.57* 6.10* 7.13* 7.70* 5.90* 7.49* 7.03*  CALCIUM 7.5* 7.3* 7.4* 7.8* 7.8*  --  8.1* 8.4* 8.5*  PHOS 6.7*  --   --   --   --   --   --   --  5.8*   CBC  Recent Labs Lab 09/03/15 0431  09/07/15 1952 09/08/15 0358 09/09/15 0440 09/09/15 1158  WBC 20.0*  < > 14.9* 14.3* 14.1* 14.9*  NEUTROABS 17.8*  --   --   --   --   --   HGB 8.5*  < > 8.6* 7.9* 7.6*  9.2*  HCT 23.8*  < > 25.2* 23.4* 21.8* 27.4*  MCV 69.0*  < > 75.9* 74.1* 73.6* 78.3  PLT 286  < > 157 174 152 160  < > = values in this interval not displayed.  Medications:    . sodium chloride   Intravenous Once  . allopurinol  300 mg Oral Daily  . amiodarone  100 mg Oral Daily  .  ceFAZolin (ANCEF) IV  1 g Intravenous Q24H  . citalopram  20 mg Oral Daily  . docusate sodium  200 mg Oral Daily  . feeding supplement (NEPRO CARB STEADY)  1,000 mL Per Tube Q24H  . feeding supplement (NEPRO CARB  STEADY)  237 mL Per Tube TID WC  . feeding supplement (PRO-STAT SUGAR FREE 64)  30 mL Per Tube TID  . ferrous sulfate  325 mg Oral BID WC  . guaiFENesin  600 mg Oral BID  . magic mouthwash  10 mL Oral TID  . metoCLOPramide (REGLAN) injection  5 mg Intravenous Q6H  . pantoprazole  40 mg Oral Daily  . pregabalin  75 mg Oral BID  . rifampin  300 mg Oral Q8H  . simethicone  80 mg Oral QID  . sodium chloride flush  10-40 mL Intracatheter Q12H  . sodium chloride flush  3 mL Intravenous Q12H  . tamsulosin  0.4 mg Oral Daily  . vancomycin  125 mg Oral QID   Elmarie Shiley, MD 09/09/2015, 2:38 PM

## 2015-09-10 ENCOUNTER — Inpatient Hospital Stay (HOSPITAL_COMMUNITY): Payer: 59

## 2015-09-10 ENCOUNTER — Encounter (HOSPITAL_COMMUNITY): Payer: Self-pay | Admitting: Plastic Surgery

## 2015-09-10 LAB — CBC
HCT: 25.3 % — ABNORMAL LOW (ref 39.0–52.0)
HCT: 24 % — ABNORMAL LOW (ref 39.0–52.0)
Hemoglobin: 8.8 g/dL — ABNORMAL LOW (ref 13.0–17.0)
Hemoglobin: 8.3 g/dL — ABNORMAL LOW (ref 13.0–17.0)
MCH: 26.5 pg (ref 26.0–34.0)
MCH: 26.7 pg (ref 26.0–34.0)
MCHC: 34.8 g/dL (ref 30.0–36.0)
MCHC: 34.6 g/dL (ref 30.0–36.0)
MCV: 76.2 fL — ABNORMAL LOW (ref 78.0–100.0)
MCV: 77.2 fL — ABNORMAL LOW (ref 78.0–100.0)
Platelets: 181 10*3/uL (ref 150–400)
Platelets: 208 10*3/uL (ref 150–400)
RBC: 3.32 MIL/uL — ABNORMAL LOW (ref 4.22–5.81)
RBC: 3.11 MIL/uL — ABNORMAL LOW (ref 4.22–5.81)
RDW: 21.1 % — ABNORMAL HIGH (ref 11.5–15.5)
RDW: 21.9 % — ABNORMAL HIGH (ref 11.5–15.5)
WBC: 12.8 10*3/uL — ABNORMAL HIGH (ref 4.0–10.5)
WBC: 13.5 10*3/uL — ABNORMAL HIGH (ref 4.0–10.5)

## 2015-09-10 LAB — COMPREHENSIVE METABOLIC PANEL
ALT: 15 U/L — ABNORMAL LOW (ref 17–63)
AST: 61 U/L — ABNORMAL HIGH (ref 15–41)
Albumin: 1.5 g/dL — ABNORMAL LOW (ref 3.5–5.0)
Alkaline Phosphatase: 112 U/L (ref 38–126)
Anion gap: 10 (ref 5–15)
BUN: 32 mg/dL — ABNORMAL HIGH (ref 6–20)
CO2: 26 mmol/L (ref 22–32)
Calcium: 8.1 mg/dL — ABNORMAL LOW (ref 8.9–10.3)
Chloride: 100 mmol/L — ABNORMAL LOW (ref 101–111)
Creatinine, Ser: 5.54 mg/dL — ABNORMAL HIGH (ref 0.61–1.24)
GFR calc Af Amer: 13 mL/min — ABNORMAL LOW (ref >60)
GFR calc non Af Amer: 11 mL/min — ABNORMAL LOW (ref >60)
Glucose, Bld: 101 mg/dL — ABNORMAL HIGH (ref 65–99)
Potassium: 3 mmol/L — ABNORMAL LOW (ref 3.5–5.1)
Sodium: 136 mmol/L (ref 135–145)
Total Bilirubin: 8.6 mg/dL — ABNORMAL HIGH (ref 0.3–1.2)
Total Protein: 5.7 g/dL — ABNORMAL LOW (ref 6.5–8.1)

## 2015-09-10 LAB — GLUCOSE, CAPILLARY
GLUCOSE-CAPILLARY: 75 mg/dL (ref 65–99)
GLUCOSE-CAPILLARY: 60 mg/dL — AB (ref 65–99)
GLUCOSE-CAPILLARY: 82 mg/dL (ref 65–99)
Glucose-Capillary: 64 mg/dL — ABNORMAL LOW (ref 65–99)
Glucose-Capillary: 68 mg/dL (ref 65–99)
Glucose-Capillary: 69 mg/dL (ref 65–99)
Glucose-Capillary: 71 mg/dL (ref 65–99)
Glucose-Capillary: 81 mg/dL (ref 65–99)
Glucose-Capillary: 85 mg/dL (ref 65–99)
Glucose-Capillary: 85 mg/dL (ref 65–99)
Glucose-Capillary: 91 mg/dL (ref 65–99)

## 2015-09-10 LAB — POCT I-STAT, CHEM 8
BUN: 37 mg/dL — AB (ref 6–20)
CHLORIDE: 102 mmol/L (ref 101–111)
CREATININE: 6.1 mg/dL — AB (ref 0.61–1.24)
Calcium, Ion: 1.08 mmol/L — ABNORMAL LOW (ref 1.12–1.23)
Glucose, Bld: 72 mg/dL (ref 65–99)
HEMATOCRIT: 25 % — AB (ref 39.0–52.0)
HEMOGLOBIN: 8.5 g/dL — AB (ref 13.0–17.0)
POTASSIUM: 5.5 mmol/L — AB (ref 3.5–5.1)
Sodium: 136 mmol/L (ref 135–145)
TCO2: 23 mmol/L (ref 0–100)

## 2015-09-10 LAB — BASIC METABOLIC PANEL
Anion gap: 6 (ref 5–15)
BUN: 39 mg/dL — AB (ref 6–20)
CHLORIDE: 104 mmol/L (ref 101–111)
CO2: 25 mmol/L (ref 22–32)
CREATININE: 5.71 mg/dL — AB (ref 0.61–1.24)
Calcium: 7.8 mg/dL — ABNORMAL LOW (ref 8.9–10.3)
GFR calc Af Amer: 12 mL/min — ABNORMAL LOW (ref >60)
GFR calc non Af Amer: 11 mL/min — ABNORMAL LOW (ref >60)
Glucose, Bld: 81 mg/dL (ref 65–99)
Potassium: 3.4 mmol/L — ABNORMAL LOW (ref 3.5–5.1)
Sodium: 135 mmol/L (ref 135–145)

## 2015-09-10 LAB — CARBOXYHEMOGLOBIN
Carboxyhemoglobin: 1.7 % — ABNORMAL HIGH (ref 0.5–1.5)
Methemoglobin: 1.3 % (ref 0.0–1.5)
O2 Saturation: 73.1 %
Total hemoglobin: 9.2 g/dL — ABNORMAL LOW (ref 13.5–18.0)

## 2015-09-10 LAB — MAGNESIUM: Magnesium: 2.3 mg/dL (ref 1.7–2.4)

## 2015-09-10 LAB — LACTATE DEHYDROGENASE: LDH: 245 U/L — ABNORMAL HIGH (ref 98–192)

## 2015-09-10 LAB — POTASSIUM: Potassium: 3.3 mmol/L — ABNORMAL LOW (ref 3.5–5.1)

## 2015-09-10 LAB — HEPARIN LEVEL (UNFRACTIONATED): Heparin Unfractionated: <0.1 IU/mL — ABNORMAL LOW (ref 0.30–0.70)

## 2015-09-10 MED ORDER — HEPARIN (PORCINE) IN NACL 100-0.45 UNIT/ML-% IJ SOLN
1100.0000 [IU]/h | INTRAMUSCULAR | Status: DC
  Administered 2015-09-10 (×2): 1100 [IU]/h via INTRAVENOUS
  Filled 2015-09-10: qty 250

## 2015-09-10 MED ORDER — VANCOMYCIN 50 MG/ML ORAL SOLUTION
125.0000 mg | Freq: Three times a day (TID) | ORAL | Status: DC
  Administered 2015-09-11 – 2015-09-18 (×21): 125 mg via ORAL
  Filled 2015-09-10 (×23): qty 2.5

## 2015-09-10 MED ORDER — POTASSIUM CHLORIDE 10 MEQ/50ML IV SOLN
INTRAVENOUS | Status: AC
  Filled 2015-09-10: qty 50

## 2015-09-10 MED ORDER — POTASSIUM CHLORIDE CRYS ER 20 MEQ PO TBCR
40.0000 meq | EXTENDED_RELEASE_TABLET | Freq: Once | ORAL | Status: AC
  Administered 2015-09-10: 40 meq via ORAL
  Filled 2015-09-10: qty 2

## 2015-09-10 MED ORDER — POTASSIUM CHLORIDE 10 MEQ/50ML IV SOLN
10.0000 meq | INTRAVENOUS | Status: AC
  Administered 2015-09-10 (×2): 10 meq via INTRAVENOUS
  Filled 2015-09-10 (×2): qty 50

## 2015-09-10 MED ORDER — HEPARIN (PORCINE) IN NACL 100-0.45 UNIT/ML-% IJ SOLN: 1100.0000 [IU]/h | INTRAMUSCULAR | Status: DC

## 2015-09-10 MED ORDER — SILDENAFIL CITRATE 20 MG PO TABS
20.0000 mg | ORAL_TABLET | Freq: Three times a day (TID) | ORAL | Status: DC
  Administered 2015-09-10 – 2015-09-25 (×46): 20 mg via ORAL
  Filled 2015-09-10 (×48): qty 1

## 2015-09-10 MED ORDER — GERHARDT'S BUTT CREAM
TOPICAL_CREAM | CUTANEOUS | Status: DC | PRN
  Filled 2015-09-10 (×2): qty 1

## 2015-09-10 NOTE — Progress Notes (Signed)
Hypoglycemic Event  CBG: 60  Treatment: 8 oz apple juice   Symptoms: none  Follow-up CBG: Time: 0835 CBG Result: 71  Possible Reasons for Event: pt refusing to eat   Comments/MD notified: MD notified this am. No new orders at this time. Will continue to monitor closely.    Glory Buff

## 2015-09-10 NOTE — Progress Notes (Signed)
ANTICOAGULATION CONSULT NOTE - Follow Up Consult  Pharmacy Consult for Heparin Indication: LvAD  Allergies  Allergen Reactions  . Phytonadione Other (See Comments)    Patient has LVAD: please check with LVAD coordinator on call or LVAD MD on call before reversal of anticoagulation with vit k    Patient Measurements: Height: 5\' 7"  (170.2 cm) Weight: 191 lb 2.2 oz (86.7 kg) IBW/kg (Calculated) : 66.1 Heparin Dosing Weight:  85 kg  Vital Signs: Temp: 97.6 F (36.4 C) (05/23 1100) Temp Source: Axillary (05/23 0700) BP: 95/82 mmHg (05/23 1400) Pulse Rate: 42 (05/23 1400)  Labs:  Recent Labs  09/08/15 0357  09/09/15 0400  09/09/15 1158 09/09/15 1545 09/10/15 0155 09/10/15 1250  HGB  --   < >  --   < > 9.2* 11.2* 8.8* 8.3*  HCT  --   < >  --   < > 27.4* 33.0* 25.3* 24.0*  PLT  --   < >  --   < > 160  --  181 208  HEPARINUNFRC <0.10*  --  <0.10*  --   --   --  <0.10*  --   CREATININE  --   < >  --   --   --  3.90* 5.54* 5.71*  < > = values in this interval not displayed.  Estimated Creatinine Clearance: 16.8 mL/min (by C-G formula based on Cr of 5.71).  Assessment: 65 yom on Coumadin pta for his LVAD, admitted with driveline abscess, sternal wound infection, and MSSA bacteremia. Home Coumadin held, and heparin bridge started 5/10 pending OR trips for wound debridement.   Heparin remains < 0.1 and bleeding continues to be an issue so MD has asked that rate not be increased. Will need to discuss rate adjustment and recommendations with the MD.   Discussed with Dr. Prescott Gum, will slowly increase heparin to 1100 units/hr and monitor bleeding.  Won't make any further adjustments without discussion with Dr. Prescott Gum.   Per RN had bleeding earlier today from site where Aline was removed, but now has resolved.  Goal of Therapy:  Heparin level 0.3-0.4  Monitor platelets by anticoagulation protocol: Yes   Plan:  Increase IV heparin to 1100 units/hr and monitor for  bleeding. Check 8 hr heparin level just to ensure < 0.4. Further up-titration of heparin to be discussed with Dr. Prescott Gum 5/24. Daily heparin level and CBC. F/u plans to resume Coumadin eventually.  Uvaldo Rising, BCPS  Clinical Pharmacist Pager [phone]  09/10/2015 2:44 PM

## 2015-09-10 NOTE — Progress Notes (Signed)
Patient ID: [name], male   DOB: Nov 22, 1970, 45 y.o.   MRN: [id number]    HeartMate 2 Rounding Note  Subjective:    Admitted from [facility] in [place] with driveline abscess. Blood cultures --> MSSA and has C diff. Remains on Nafcillin  and oral vancomycin.   08/27/15 S/P I &D Epigastric with hardware exposed and VAC placement.  5/11 repeat I&D and wound vac replacement.  5/11 TEE no vegetations on valves or ICD wire 5/22 S/P ABRA and Acell   Yesterday he returned to the OR for application of ABRA and Acell. Also received 2UPRBCs. Over night had 75 cc urine output. Had increased ectopy with runs of NSVT.    LVAD INTERROGATION:  HeartMate II LVAD:  Flow 5.2 liters/min, speed 9200, power 6.0  PI 6.4   PI events.   Objective:    Vital Signs:   Temp:  [97.9 F (36.6 C)-99 F (37.2 C)] 97.9 F (36.6 C) (05/23 0400) Pulse Rate:  [35-99] 88 (05/23 0700) Resp:  [12-26] 16 (05/23 0700) BP: (94-155)/(69-106) 94/80 mmHg (05/23 0630) SpO2:  [97 %-100 %] 100 % (05/23 0700) Arterial Line BP: (82-153)/(55-89) 91/63 mmHg (05/23 0700) Weight:  [191 lb 2.2 oz (86.7 kg)-197 lb 8.5 oz (89.6 kg)] 191 lb 2.2 oz (86.7 kg) (05/23 0400) Last BM Date: 09/09/15 Mean arterial Pressure  70-90s   Intake/Output:   Intake/Output Summary (Last 24 hours) at 09/10/15 0715 Last data filed at 09/10/15 0600  Gross per 24 hour  Intake 1762.8 ml  Output   3190 ml  Net -1427.2 ml     Physical Exam: CVP 6-7 General: In bed. NAD HEENT: Scleral icterus Neck: supple.  JVP 7-8 Carotids 2+ bilat; no bruits. No lymphadenopathy or thryomegaly appreciated. R subclavian trialysis cath dressing dry Cor: Mechanical heart sounds with LVAD hum present. Lungs: clear on  Abdomen: soft, tender, distended. Good bowel sounds. Mid epigastric dressing ok.   Driveline: C/D/I; securement device intact and driveline incorporated Extremities: no cyanosis, clubbing, rash, 1+ edema R and LLE SCDs.  Neuro:  A & O x3  Telemetry: NSR 80s  NSVT over night    Labs: Basic Metabolic Panel:  Recent Labs Lab 09/05/15 0450  09/06/15 0415 09/07/15 0400 09/08/15 1201 09/09/15 1545 09/10/15 0155  NA 134*  < > 138 139 135 139 136  K 3.7  < > 3.3* 3.3* 3.0* 3.4* 3.0*  CL 103  < > 102 103 102 100* 100*  CO2 16*  --  21* 20* 22  --  26  GLUCOSE 99  < > 93 82 105* 82 101*  BUN 36*  < > 29* 44* 53* 26* 32*  CREATININE 7.13*  < > 5.90* 7.49* 7.03* 3.90* 5.54*  CALCIUM 7.8*  --  8.1* 8.4* 8.5*  --  8.1*  MG  --   --   --   --   --   --  2.3  PHOS  --   --   --   --  5.8*  --   --   < > = values in this interval not displayed.  Liver Function Tests:  Recent Labs Lab 09/04/15 0411 09/05/15 0450 09/05/15 1827 09/06/15 0415 09/07/15 0400 09/08/15 1201 09/10/15 0155  AST 46* 41  --  46* 42*  --  61*  ALT 31 20 18 17  8*  --  15*  ALKPHOS 99 96  --  109 92  --  112  BILITOT 9.4* 8.9*  --  9.1* 8.5*  --  8.6*  PROT 5.9* 5.9*  --  5.8* 5.7*  --  5.7*  ALBUMIN 1.8* 1.7*  --  1.7* 1.5* 1.4* 1.5*   No results for input(s): LIPASE, AMYLASE in the last 168 hours. No results for input(s): AMMONIA in the last 168 hours.  CBC:  Recent Labs Lab 09/07/15 1952 09/08/15 0358 09/09/15 0440 09/09/15 1158 09/09/15 1545 09/10/15 0155  WBC 14.9* 14.3* 14.1* 14.9*  --  12.8*  HGB 8.6* 7.9* 7.6* 9.2* 11.2* 8.8*  HCT 25.2* 23.4* 21.8* 27.4* 33.0* 25.3*  MCV 75.9* 74.1* 73.6* 78.3  --  76.2*  PLT 157 174 152 160  --  181    INR: No results for input(s): INR in the last 168 hours.  Other results:    Imaging: Dg Chest Port 1 View  09/09/2015  CLINICAL DATA:  Left ventricular assist device EXAM: PORTABLE CHEST 1 VIEW COMPARISON:  09/07/2015 FINDINGS: Left ventricular assist device unchanged in position. AICD unchanged in position. Left atrial appendage clip unchanged. Right subclavian central venous catheter tip at the cavoatrial junction. Feeding tube in place with the tip not visualized. Mild  improvement in diffuse bilateral airspace disease most compatible with resolving edema. Improvement in bibasilar atelectasis. No effusion. IMPRESSION: Left ventricular assist device unchanged. Improvement in pulmonary edema. Electronically Signed   By: Franchot Gallo M.D.   On: 09/09/2015 11:54     Medications:     Scheduled Medications: . sodium chloride   Intravenous Once  . allopurinol  300 mg Oral Daily  . amiodarone  100 mg Oral Daily  .  ceFAZolin (ANCEF) IV  1 g Intravenous Q24H  . citalopram  20 mg Oral Daily  . docusate sodium  200 mg Oral Daily  . feeding supplement (NEPRO CARB STEADY)  1,000 mL Per Tube Q24H  . feeding supplement (NEPRO CARB STEADY)  237 mL Per Tube TID WC  . feeding supplement (PRO-STAT SUGAR FREE 64)  30 mL Per Tube TID  . ferrous sulfate  325 mg Oral BID WC  . guaiFENesin  600 mg Oral BID  . magic mouthwash  10 mL Oral TID  . metoCLOPramide (REGLAN) injection  5 mg Intravenous Q6H  . pantoprazole  40 mg Oral Daily  . pregabalin  75 mg Oral BID  . rifampin  300 mg Oral Q8H  . simethicone  80 mg Oral QID  . sodium chloride flush  10-40 mL Intracatheter Q12H  . sodium chloride flush  3 mL Intravenous Q12H  . tamsulosin  0.4 mg Oral Daily  . vancomycin  125 mg Oral QID    Infusions: . heparin 900 Units/hr (09/10/15 0600)  . milrinone 0.125 mcg/kg/min (09/10/15 0600)  . norepinephrine (LEVOPHED) Adult infusion Stopped (09/09/15 1139)  . vasopressin (PITRESSIN) infusion - *FOR SHOCK* Stopped (09/05/15 2200)    PRN Medications: sodium chloride, sodium chloride, sodium chloride, alteplase, bisacodyl **OR** bisacodyl, fentaNYL (SUBLIMAZE) injection, gelatin adsorbable, Gerhardt's butt cream, heparin, ondansetron, oxyCODONE, sodium chloride flush, sodium chloride flush, traMADol   Assessment:   1. LVAD Complication- Driveline abscess 2. MSSA bacteremia -> septic shock 3. Acute/Chronic systolic HF s/p VAD placement 9/16 4. C. Difficile colitis 5.  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 Acute blood loss anemia 8. Severe Malnutrition.   9.. ARF 10. Klebsiella (ESBL) pneumonia 11. NSVT  12. Hypokalemia  13. RV Failure   Plan/Discussion:    S/P I&D LVAD driveline abscess. Exposed hardware.  09/09/15 09/09/15  S/P ABRA and reapplication of ACell. WBC coming down.   ID following. Now on ceftriaxone for klebsiella PNA and MSSA. Oral vanc for c.diff. WBC 12.8  Over night with NSVT. K 3.0 K has been replaced. Mag 2.3. Todays CO-OX 73%. With increased ectopy and adequate CO-OX will stop milrinone.   Has RV failure. Maps better. Restart 20 mg sildenafil.   Tolerating HD. Making some urine.   VAD parameters ok. On heparin.  Hgb 8.8   Can liberalize diet. Restart tube feeds today.    Length of Stay: [place] NP-C  09/10/2015, 7:15 AM  VAD Team --- VAD ISSUES ONLY--- Pager [phone] (7am - 7am)  Advanced Heart Failure Team  Pager [phone] (M-F; 7a - 4p)  Please contact [facility] for night-coverage after hours (4p -7a ) and weekends on [email or web address]    Patient seen and examined with Darrick Grinder, NP. We discussed all aspects of the encounter. I agree with the assessment and plan as stated above.   Looks better today despite large-volume emesis. KUB without obstruction. Beginning to make urine. Bleeding has slowed. Co-ox ok -> can wean milrinone. Had NSVT overnight but now improved with supplementing electrolytes. Can turn speed down as needed if having suction events. Continue heparin/coumadin. VAD parameters ok. Mobilize.   Glori Bickers MD

## 2015-09-10 NOTE — Clinical Social Work Note (Signed)
Clinical Social Work Assessment  Patient Details  Name: [name] MRN: [id number] Date of Birth: [birth year]  Date of referral:  09/10/15               Reason for consult:  Discharge Planning                Permission sought to share information with:  Chartered certified accountant granted to share information::  Yes, Verbal Permission Granted  Name::     [name]  Agency::  SNFs  Relationship::  Sister  Contact Information:   (Pateint reported he does not have sister contact infromation at this time. )  Housing/Transportation Living arrangements for the past 2 months:  Single Family Home Source of Information:  Parent Patient Interpreter Needed:  None Criminal Activity/Legal Involvement Pertinent to Current Situation/Hospitalization:  No - Comment as needed Significant Relationships:  Siblings Lives with:  Siblings Do you feel safe going back to the place where you live?  Yes Need for family participation in patient care:  Yes (Comment)  Care giving concerns:  The patient is aggregable for short term rehab at discharge. Patient would like to rebuild his strength to return home.    Social Worker assessment / plan:  CSW met with patient at beside to complete assessment. Patient was resting comfortably in bed. CSW explained PT recommendation for CIR placement. CSW explained that an alternate plan of SNFs placement may be needed. Patient reported his support as his sister [name], but does not have her contact information at this time. CSW will follow up with bed offers.  Employment status:  Therapist, music:  Managed Care PT Recommendations:  Inpatient Rehab Consult Information / Referral to community resources:  [facility]  Patient/Family's Response to care:  The patient appears happy with the care he is receiving in hospital and is appreciative of CSW assistance.  Patient/Family's Understanding of and Emotional Response to  Diagnosis, Current Treatment, and Prognosis:  The patient has a good understanding of why he was admitted. He understands the care plan and what he will need post discharge.  Emotional Assessment Appearance:  Appears stated age Attitude/Demeanor/Rapport:   (Patient was welcoming of CSW and appropriate. ) Affect (typically observed):  Accepting, Calm, Appropriate Orientation:  Oriented to Self, Oriented to Place, Oriented to  Time, Oriented to Situation Alcohol / Substance use:  Not Applicable Psych involvement (Current and /or in the community):  No (Comment)  Discharge Needs  Concerns to be addressed:  Discharge Planning Concerns Readmission within the last 30 days:  No Current discharge risk:  Physical Impairment Barriers to Discharge:  Continued Medical Work up   [employer], LCSW 09/10/2015, 3:52 PM

## 2015-09-10 NOTE — Progress Notes (Signed)
1 Day Post-Op Late entry (patient seen at 13:00 today) Subjective: S/P application of Acell in wound bed and Abra device placement yesterday.  ABD seems some what distended today and had episode of large emesis following breakfast per nursing. ABD and 4x4 gauze removed from abdominal wound under sterile technique with minimal bleeding noted. No odor, sorbact remains in place over wound bed.  Sterile 4x4 gauze with surgical lubricant placed over Sorbact and covered with ABD pad and montgomery straps.   Objective: Vital signs in last 24 hours: Temp:  [96.9 F (36.1 C)-98.1 F (36.7 C)] 97.9 F (36.6 C) (05/23 1550) Pulse Rate:  [35-96] 80 (05/23 1600) Resp:  [10-26] 19 (05/23 1600) BP: (90-130)/(65-95) 104/65 mmHg (05/23 1600) SpO2:  [95 %-100 %] 96 % (05/23 1600) Arterial Line BP: (82-141)/(55-79) 107/66 mmHg (05/23 1000) Weight:  [86.7 kg (191 lb 2.2 oz)] 86.7 kg (191 lb 2.2 oz) (05/23 0400) Last BM Date: 09/10/15  Intake/Output from previous day: 05/22 0701 - 05/23 0700 In: 1785 [P.O.:180; I.V.:466; Blood:619; NG/GT:170; IV Piggyback:350] Out: 3190 [Urine:185; Blood:5] Intake/Output this shift: Total I/O In: 192.4 [I.V.:192.4] Out: 90 [Urine:90]  General appearance: somnolent but arouses to voice ABD wound dressing change as noted. Minimal bleeding on the operative dressings from yesterday. ABD does seem more distended, so Abra device not adjusted today  Lab Results:   Recent Labs  09/10/15 0155 09/10/15 1250 09/10/15 1526  WBC 12.8* 13.5*  --   HGB 8.8* 8.3* 8.5*  HCT 25.3* 24.0* 25.0*  PLT 181 208  --    BMET  Recent Labs  09/10/15 0155  09/10/15 1250 09/10/15 1526  NA 136  --  135 136  K 3.0*  < > 3.4* 5.5*  CL 100*  --  104 102  CO2 26  --  25  --   GLUCOSE 101*  --  81 72  BUN 32*  --  39* 37*  CREATININE 5.54*  --  5.71* 6.10*  CALCIUM 8.1*  --  7.8*  --   < > = values in this interval not displayed. PT/INR No results for input(s): LABPROT, INR in  the last 72 hours. ABG  Recent Labs  09/09/15 1145  PHART 7.332*  HCO3 20.2    Studies/Results: Dg Chest Port 1 View  09/09/2015  CLINICAL DATA:  Left ventricular assist device EXAM: PORTABLE CHEST 1 VIEW COMPARISON:  09/07/2015 FINDINGS: Left ventricular assist device unchanged in position. AICD unchanged in position. Left atrial appendage clip unchanged. Right subclavian central venous catheter tip at the cavoatrial junction. Feeding tube in place with the tip not visualized. Mild improvement in diffuse bilateral airspace disease most compatible with resolving edema. Improvement in bibasilar atelectasis. No effusion. IMPRESSION: Left ventricular assist device unchanged. Improvement in pulmonary edema. Electronically Signed   By: Franchot Gallo M.D.   On: 09/09/2015 11:54   Dg Abd Portable 1v  09/10/2015  CLINICAL DATA:  Feeding tube placement EXAM: PORTABLE ABDOMEN - 1 VIEW COMPARISON:  Portable exam 1528 hours compared to 09/10/2015 at 1345 hours FINDINGS: Feeding tube traverses stomach with tip projecting over either the duodenal bulb or proximal descending duodenum. LVAD projects over upper abdomen. AICD lead projects over RIGHT ventricle. Visualized bowel gas pattern normal. IMPRESSION: Tip of feeding tube projects over either the duodenal bulb or proximal descending duodenum. Electronically Signed   By: Lavonia Dana M.D.   On: 09/10/2015 15:44   Dg Abd Portable 1v  09/10/2015  CLINICAL DATA:  Abdominal distention and emesis.  EXAM: PORTABLE ABDOMEN - 1 VIEW COMPARISON:  One-view abdomen 09/04/2015. FINDINGS: A left ventricular assist device is in place. The bowel gas pattern is normal. Previously noted gastric distention has resolved. There is no obstruction or free air. IMPRESSION: Normal bowel gas pattern. Electronically Signed   By: San Morelle M.D.   On: 09/10/2015 13:58    Anti-infectives: Anti-infectives    Start     Dose/Rate Route Frequency Ordered Stop   09/09/15 2000   ceFAZolin (ANCEF) IVPB 1 g/50 mL premix     1 g 100 mL/hr over 30 Minutes Intravenous Every 24 hours 09/09/15 1239     09/09/15 1011  polymyxin B 500,000 Units, bacitracin 50,000 Units in sodium chloride irrigation 0.9 % 500 mL irrigation  Status:  Discontinued       As needed 09/09/15 1012 09/09/15 1047   09/09/15 0800  ceFAZolin (ANCEF) IVPB 1 g/50 mL premix  Status:  Discontinued     1 g 100 mL/hr over 30 Minutes Intravenous Every 8 hours 09/09/15 0759 09/09/15 1239   09/09/15 0600  ceFAZolin (ANCEF) IVPB 2g/100 mL premix  Status:  Discontinued     2 g 200 mL/hr over 30 Minutes Intravenous To ShortStay Surgical 09/08/15 1415 09/09/15 1049   09/05/15 1426  polymyxin B 500,000 Units, bacitracin 50,000 Units in sodium chloride irrigation 0.9 % 500 mL irrigation  Status:  Discontinued       As needed 09/05/15 1434 09/05/15 1513   09/05/15 1100  cefTRIAXone (ROCEPHIN) 2 g in dextrose 5 % 50 mL IVPB  Status:  Discontinued     2 g 100 mL/hr over 30 Minutes Intravenous Every 24 hours 09/05/15 0948 09/09/15 1238   09/05/15 1045  ceFAZolin (ANCEF) IVPB 2g/100 mL premix     2 g 200 mL/hr over 30 Minutes Intravenous To ShortStay Surgical 09/04/15 2206 09/05/15 1345   09/04/15 1000  imipenem-cilastatin (PRIMAXIN) 500 mg in sodium chloride 0.9 % 100 mL IVPB  Status:  Discontinued     500 mg 200 mL/hr over 30 Minutes Intravenous Every 12 hours 09/03/15 1817 09/05/15 0948   09/04/15 0600  vancomycin (VANCOCIN) IVPB 1000 mg/200 mL premix  Status:  Discontinued     1,000 mg 200 mL/hr over 60 Minutes Intravenous Every 24 hours 09/03/15 0757 09/03/15 0953   09/03/15 1518  vancomycin (VANCOCIN) 1,000 mg in sodium chloride 0.9 % 1,000 mL irrigation  Status:  Discontinued       As needed 09/03/15 1518 09/03/15 1525   09/03/15 1330  vancomycin (VANCOCIN) 1,000 mg in sodium chloride 0.9 % 1,000 mL irrigation  Status:  Discontinued      Irrigation To Surgery 09/03/15 1325 09/03/15 1628   09/03/15 1200   imipenem-cilastatin (PRIMAXIN) 250 mg in sodium chloride 0.9 % 100 mL IVPB  Status:  Discontinued     250 mg 200 mL/hr over 30 Minutes Intravenous Every 6 hours 09/03/15 0749 09/03/15 1817   09/03/15 0200  vancomycin (VANCOCIN) IVPB 750 mg/150 ml premix  Status:  Discontinued     750 mg 150 mL/hr over 60 Minutes Intravenous Every 12 hours 09/02/15 1234 09/03/15 0745   09/02/15 1400  vancomycin (VANCOCIN) IVPB 1000 mg/200 mL premix     1,000 mg 200 mL/hr over 60 Minutes Intravenous  Once 09/02/15 1234 09/02/15 1430   09/02/15 0900  imipenem-cilastatin (PRIMAXIN) 500 mg in sodium chloride 0.9 % 100 mL IVPB  Status:  Discontinued     500 mg 200 mL/hr over 30 Minutes Intravenous  Every 8 hours 09/02/15 0814 09/03/15 0749   08/30/15 0400  vancomycin (VANCOCIN) 1,250 mg in sodium chloride 0.9 % 250 mL IVPB  Status:  Discontinued     1,250 mg 166.7 mL/hr over 90 Minutes Intravenous To Surgery 08/29/15 0611 08/29/15 1012   08/30/15 0400  cefUROXime (ZINACEF) 1.5 g in dextrose 5 % 50 mL IVPB  Status:  Discontinued     1.5 g 100 mL/hr over 30 Minutes Intravenous To Surgery 08/29/15 0611 08/29/15 1012   08/30/15 0400  fluconazole (DIFLUCAN) IVPB 400 mg  Status:  Discontinued     400 mg 100 mL/hr over 120 Minutes Intravenous To Surgery 08/29/15 0611 08/29/15 1012   08/30/15 0400  rifampin (RIFADIN) 600 mg in sodium chloride 0.9 % 100 mL IVPB  Status:  Discontinued     600 mg 200 mL/hr over 30 Minutes Intravenous To Surgery 08/29/15 0611 08/29/15 1012   08/29/15 0845  vancomycin (VANCOCIN) 1,000 mg in sodium chloride 0.9 % 1,000 mL irrigation  Status:  Discontinued      Irrigation To Surgery 08/29/15 0834 08/29/15 1012   08/29/15 0615  cefUROXime (ZINACEF) 750 mg in dextrose 5 % 50 mL IVPB  Status:  Discontinued     750 mg 100 mL/hr over 30 Minutes Intravenous To Surgery 08/29/15 0609 08/29/15 1012   08/29/15 0615  vancomycin (VANCOCIN) powder 1,000 mg     1,000 mg Other To Surgery 08/29/15 0609  08/29/15 0900   08/27/15 2300  vancomycin (VANCOCIN) 50 mg/mL oral solution 125 mg  Status:  Discontinued     125 mg Oral Every 6 hours 08/26/15 2200 08/27/15 0807   08/27/15 1601  vancomycin (VANCOCIN) 1,000 mg in sodium chloride 0.9 % 1,000 mL irrigation  Status:  Discontinued       As needed 08/27/15 1601 08/27/15 1740   08/27/15 1500  vancomycin (VANCOCIN) 1,000 mg in sodium chloride 0.9 % 1,000 mL irrigation  Status:  Discontinued      Irrigation To Surgery 08/27/15 1449 08/27/15 1748   08/27/15 1400  rifampin (RIFADIN) capsule 300 mg     300 mg Oral Every 8 hours 08/27/15 1141     08/27/15 1100  vancomycin (VANCOCIN) 50 mg/mL oral solution 125 mg     125 mg Oral 4 times daily 08/27/15 1052 09/10/15 0959   08/26/15 2145  nafcillin 2 g in dextrose 5 % 100 mL IVPB  Status:  Discontinued     2 g 200 mL/hr over 30 Minutes Intravenous Every 4 hours 08/26/15 2104 09/02/15 0850      Assessment/Plan: s/p Procedure(s): DEBRIDEMENT AND CLOSURE WOUND WITH PLACEMENT OF ABRA CLOSURE DEVICE (N/A) Will plan to follow up for dressing change,adjust Abra device tomorrow  LOS: 15 days    Jadrian Bulman,PA-C Plastic Surgery [phone]

## 2015-09-10 NOTE — Progress Notes (Signed)
After taking all 10:00 meds pt projectile vomited. Notified Dr. Haroldine Laws. Pt stable and states he feels better. Will continue to monitor closely.

## 2015-09-10 NOTE — Progress Notes (Signed)
Patient ID: [name], male   DOB: 1971-04-20, 45 y.o.   MRN: [id number]  [facility] Progress Note   Assessment/ Plan:   1. AKI Suspected to be ATN from sepsis/shock. He is oliguric and without any evidence of renal recovery so far-no acute indications for dialysis today, next hemodialysis treatment ordered for tomorrow with labs later today to assess on the status of his potassium following repletion. Mild edema on physical exam, no uremic signs or symptoms. 2. LVAD MSSA driveline infection/infected VAD pocket: On intravenous antibiotic therapy and now status post debridement and initial step towards wound closure. Now on Rifampin. 3. Clostridium difficile colitis: On oral vancomycin, reports significant clinical improvement-no further diarrhea. 4. Hypokalemia: Likely secondary to gastrointestinal losses from associated colitis-status post oral and ongoing intravenous potassium repletion, recheck labs later this afternoon. 5. Nonischemic cardiomyopathy/chronic systolic heart failure: supported with LVAD.  Subjective:   Reports that he is comfortable, denies any chest pain or shortness of breath and states the diarrhea is much better.    Objective:   BP 94/80 mmHg  Pulse 88  Temp(Src) 96.9 F (36.1 C) (Axillary)  Resp 16  Ht 5\' 7"  (1.702 m)  Wt 86.7 kg (191 lb 2.2 oz)  BMI 29.93 kg/m2  SpO2 100%  Intake/Output Summary (Last 24 hours) at 09/10/15 0846 Last data filed at 09/10/15 0700  Gross per 24 hour  Intake 1778.6 ml  Output   3190 ml  Net -1411.4 ml   Weight change: 2.8 kg (6 lb 2.8 oz)  Physical Exam: [id number] resting in bed, watching TV CVS: Pulse regular rhythm, normal rate Resp: Poor inspiratory effort-decreased breath sounds bilaterally Abd: Soft, anterior dressings noted Ext: Trace- 1+ lower extremity edema  Imaging: Dg Chest Port 1 View  09/09/2015  CLINICAL DATA:  Left ventricular assist device EXAM: PORTABLE CHEST 1 VIEW COMPARISON:   09/07/2015 FINDINGS: Left ventricular assist device unchanged in position. AICD unchanged in position. Left atrial appendage clip unchanged. Right subclavian central venous catheter tip at the cavoatrial junction. Feeding tube in place with the tip not visualized. Mild improvement in diffuse bilateral airspace disease most compatible with resolving edema. Improvement in bibasilar atelectasis. No effusion. IMPRESSION: Left ventricular assist device unchanged. Improvement in pulmonary edema. Electronically Signed   By: Franchot Gallo M.D.   On: 09/09/2015 11:54    Labs: BMET  Recent Labs Lab 09/04/15 0411 09/04/15 1512 09/05/15 0450 09/05/15 0805 09/06/15 0415 09/07/15 0400 09/08/15 1201 09/09/15 1545 09/10/15 0155  NA 134* 135 134* 138 138 139 135 139 136  K 3.8 3.6 3.7 3.6 3.3* 3.3* 3.0* 3.4* 3.0*  CL 104 106 103 105 102 103 102 100* 100*  CO2 16* 15* 16*  --  21* 20* 22  --  26  GLUCOSE 104* 106* 99 92 93 82 105* 82 101*  BUN 27* 32* 36* 36* 29* 44* 53* 26* 32*  CREATININE 5.57* 6.10* 7.13* 7.70* 5.90* 7.49* 7.03* 3.90* 5.54*  CALCIUM 7.4* 7.8* 7.8*  --  8.1* 8.4* 8.5*  --  8.1*  PHOS  --   --   --   --   --   --  5.8*  --   --    CBC  Recent Labs Lab 09/08/15 0358 09/09/15 0440 09/09/15 1158 09/09/15 1545 09/10/15 0155  WBC 14.3* 14.1* 14.9*  --  12.8*  HGB 7.9* 7.6* 9.2* 11.2* 8.8*  HCT 23.4* 21.8* 27.4* 33.0* 25.3*  MCV 74.1* 73.6* 78.3  --  76.2*  PLT 174 152 160  --  181    Medications:    . sodium chloride   Intravenous Once  . allopurinol  300 mg Oral Daily  . amiodarone  100 mg Oral Daily  .  ceFAZolin (ANCEF) IV  1 g Intravenous Q24H  . citalopram  20 mg Oral Daily  . docusate sodium  200 mg Oral Daily  . feeding supplement (NEPRO CARB STEADY)  1,000 mL Per Tube Q24H  . feeding supplement (NEPRO CARB STEADY)  237 mL Per Tube TID WC  . feeding supplement (PRO-STAT SUGAR FREE 64)  30 mL Per Tube TID  . ferrous sulfate  325 mg Oral BID WC  . guaiFENesin   600 mg Oral BID  . magic mouthwash  10 mL Oral TID  . metoCLOPramide (REGLAN) injection  5 mg Intravenous Q6H  . pantoprazole  40 mg Oral Daily  . potassium chloride  10 mEq Intravenous Q1 Hr x 2  . pregabalin  75 mg Oral BID  . rifampin  300 mg Oral Q8H  . sildenafil  20 mg Oral Q8H  . simethicone  80 mg Oral QID  . sodium chloride flush  10-40 mL Intracatheter Q12H  . sodium chloride flush  3 mL Intravenous Q12H  . tamsulosin  0.4 mg Oral Daily  . vancomycin  125 mg Oral QID   Elmarie Shiley, MD 09/10/2015, 8:46 AM

## 2015-09-10 NOTE — Progress Notes (Signed)
Hypoglycemic Event  CBG: 68  Treatment: 4 oz soda  Symptoms: None  Follow-up CBG: Time: 0100 CBG Result: 91  Possible Reasons for Event: Tube feeds were stopped several hours ago and pt refused dinner  Comments/MD notified: Philbert Riser (Fellow on call for cardiology) paged and made aware, RN suggested changing the pt's IV fluids - no new orders at this time. Will continue to monitor.     Ihor Dow

## 2015-09-10 NOTE — Progress Notes (Signed)
CSW met at bedside with patient. Patient was awake and spoke of his surgery yesterday and dialysis which he stated "wiped me out". Patient in good spirits and shared thoughts about his dog "Duke" and his mother who recently passed away. Patient stated he ate a little this morning but will take it slow as "I don't want throw up again." CSW will continue to follow for supportive intervention throughout recovery. Raquel Sarna, LCSW [phone]

## 2015-09-10 NOTE — Progress Notes (Signed)
Dr. Mercy Moore (Nephrology) notified about pt's K+ 3.0 and short runs of vtach. Orders received to give Potassium 40 meq PO and recheck potassium level at 10:00 am. Will continue to monitor.

## 2015-09-10 NOTE — Progress Notes (Signed)
Physical Therapy Treatment Patient Details Name: [name] MRN: [id number] DOB: 05-06-1970 Today's Date: 09/10/2015    History of Present Illness [name] is a 45 y.o. 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 TEE on 5/11 negative 5/22 DEBRIDEMENT AND CLOSURE WOUND WITH PLACEMENT OF ABRA CLOSURE DEVICE     PT Comments    Patient very sleepy (and has been all day per RN). Max cues to maintain arousal and attention. Exercises at bed level as pt needing to have KUB taken prior to OOB. Patient more alert once sitting upright. Stood well (~3 minutes) and pivoted to chair with 2 person assist due to slightly unsteady (even with his wide BOS). Anticipate can walk on next visit.    Follow Up Recommendations  Supervision/Assistance - 24 hour;CIR     Equipment Recommendations  Other (comment) (TBA)    Recommendations for Other Services       Precautions / Restrictions Precautions Precautions: Fall Restrictions Weight Bearing Restrictions: No    Mobility  Bed Mobility Overal bed mobility: Needs Assistance;+2 for physical assistance Bed Mobility: Rolling;Sidelying to Sit Rolling: Mod assist Sidelying to sit: Mod assist;+2 for safety/equipment;HOB elevated       General bed mobility comments: rolling to protect incision; pt with good effort to push to sitting  Transfers Overall transfer level: Needs assistance Equipment used: 2 person hand held assist Transfers: Sit to/from Stand;Stand Pivot Transfers Sit to Stand: Min assist;+2 physical assistance;+2 safety/equipment Stand pivot transfers: +2 physical assistance;+2 safety/equipment;Min assist       General transfer comment: to stand x 2; pivot x 1 with wide base of support and slightly unsteady  Ambulation/Gait                  Stairs            Wheelchair Mobility    Modified Rankin (Stroke Patients Only)       Balance Overall balance assessment: Needs assistance Sitting-balance support: No upper extremity supported;Feet supported Sitting balance-Leahy Scale: Fair     Standing balance support: Single extremity supported Standing balance-Leahy Scale: Poor Standing balance comment: stood x 3 minutes with 1 HHA;  for RN to assess and treat his bottom                    Cognition Arousal/Alertness: Lethargic Behavior During Therapy: Flat affect Overall Cognitive Status: Difficult to assess                      Exercises General Exercises - Lower Extremity Ankle Circles/Pumps: AROM;Both;10 reps Short Arc Quad: AROM;AAROM;Both;5 reps Heel Slides: AAROM;Strengthening;Both;5 reps (assisted flexion; resisted extension)    General Comments        Pertinent Vitals/Pain Pain Assessment: Faces (pt unable to state a number (lethargy)) Faces Pain Scale: Hurts little more Pain Location: abd Pain Descriptors / Indicators: Operative site guarding Pain Intervention(s): Limited activity within patient's tolerance;Monitored during session;Repositioned    Home Living                      Prior Function            PT Goals (current goals can now be found in the care plan section) Acute Rehab PT Goals Patient Stated Goal: to get better Time For Goal Achievement: 09/20/15 Progress towards PT  goals: Progressing toward goals    Frequency  Min 3X/week    PT Plan Current plan remains appropriate    Co-evaluation             End of Session Equipment Utilized During Treatment:  (gait belt deferred due to abd incisions) Activity Tolerance: Patient limited by fatigue Patient left: with call bell/phone within reach;with nursing/sitter in room;in chair     Time: [id number] PT Time Calculation (min) (ACUTE ONLY): 36 min  Charges:  $Therapeutic Exercise: 8-22  mins $Therapeutic Activity: 8-22 mins                    G Codes:      Mohamed Portlock 2015/09/21, 3:55 PM Pager [phone]

## 2015-09-10 NOTE — Care Management Note (Signed)
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  Plan is for pt to return to OR 09/02/15.  CM will continue to monitor for disposition needs   Expected Discharge Date:                  Expected Discharge Plan:  IP Rehab Facility  In-House Referral:  Clinical Social Work  Discharge planning Services  CM Consult  Post Acute Care Choice:    Choice offered to:     DME Arranged:    DME Agency:     HH Arranged:    [place] Agency:     Status of Service:  In process, will continue to follow  Medicare Important Message Given:    Date Medicare IM Given:    Medicare IM give by:    Date Additional Medicare IM Given:    Additional Medicare Important Message give by:     If discussed at [place] of Stay Meetings, dates discussed:    Additional Comments:   09/10/2015  CIR following - CSW consulted as back up plan  09/05/15 Pt remains on pressor.  Pt taken back to OR today for wound debridement.   Maryclare Labrador, RN 09/10/2015, 3:37 PM

## 2015-09-10 NOTE — Progress Notes (Signed)
[place] for Infectious Disease   Reason for visit: Follow up on drive line infection, C diff  Interval History:   Afebrile.  Had episode of vomiting from ileus, also had dophoff tube replaced. Only 1 BM this am Physical Exam: Constitutional:  Filed Vitals:   09/10/15 1500 09/10/15 1550  BP: 113/77   Pulse: 82   Temp:  97.9 F (36.6 C)  Resp: 10    GEN: sitting up in chair, patient appears in NAD HEENT: slight scleral icterus Respiratory: Normal respiratory effort; CTA B anterior exam GI: soft, nt, + BS MS: no edema Skin: no rashes   Review of Systems: Respiratory: negative for cough Integument/breast: negative for rash  Lab Results  Component Value Date   WBC 13.5* 09/10/2015   HGB 8.5* 09/10/2015   HCT 25.0* 09/10/2015   MCV 77.2* 09/10/2015   PLT 208 09/10/2015    Lab Results  Component Value Date   CREATININE 6.10* 09/10/2015   BUN 37* 09/10/2015   NA 136 09/10/2015   K 5.5* 09/10/2015   CL 102 09/10/2015   CO2 25 09/10/2015    Lab Results  Component Value Date   ALT 15* 09/10/2015   AST 61* 09/10/2015   ALKPHOS 112 09/10/2015     Microbiology: Recent Results (from the past 240 hour(s))  Culture, respiratory (NON-Expectorated)     Status: None   Collection Time: 09/02/15  8:30 AM  Result Value Ref Range Status   Specimen Description TRACHEAL ASPIRATE  Final   Special Requests Normal  Final   Gram Stain   Final    MODERATE WBC PRESENT,BOTH PMN AND MONONUCLEAR FEW SQUAMOUS EPITHELIAL CELLS PRESENT FEW GRAM NEGATIVE RODS Performed at [facility]    Culture   Final    MODERATE KLEBSIELLA PNEUMONIAE Performed at [facility]    Report Status 09/04/2015 FINAL  Final   Organism ID, Bacteria KLEBSIELLA PNEUMONIAE  Final      Susceptibility   Klebsiella pneumoniae - MIC*    AMPICILLIN >=32 RESISTANT Resistant     AMPICILLIN/SULBACTAM 16 INTERMEDIATE Intermediate     CEFEPIME <=1 SENSITIVE Sensitive     CEFTAZIDIME <=1  SENSITIVE Sensitive     CEFTRIAXONE <=1 SENSITIVE Sensitive     CIPROFLOXACIN <=0.25 SENSITIVE Sensitive     GENTAMICIN <=1 SENSITIVE Sensitive     IMIPENEM <=0.25 SENSITIVE Sensitive     PIP/TAZO 16 SENSITIVE Sensitive     TOBRAMYCIN <=1 SENSITIVE Sensitive     TRIMETH/SULFA Value in next row Sensitive      <=20 SENSITIVE(NOTE)    * MODERATE KLEBSIELLA PNEUMONIAE  Culture, blood (single) w Reflex to ID Panel     Status: None   Collection Time: 09/02/15 10:20 AM  Result Value Ref Range Status   Specimen Description BLOOD LEFT HAND  Final   Special Requests IN PEDIATRIC BOTTLE 1CC  Final   Culture NO GROWTH 5 DAYS  Final   Report Status 09/07/2015 FINAL  Final  Urine culture     Status: Abnormal   Collection Time: 09/02/15  5:30 PM  Result Value Ref Range Status   Specimen Description URINE, CLEAN CATCH  Final   Special Requests NONE  Final   Culture MULTIPLE SPECIES PRESENT, SUGGEST RECOLLECTION (A)  Final   Report Status 09/04/2015 FINAL  Final    Impression/Plan:  1. MSSA Drive line infection - will place him  Cefazolin and continue on rifampin 2. Aspiration pneumonia - treated 3.  C diff -  will start tapering to 125mg  TID 4. AKi = continue with renally dosed cefazolin

## 2015-09-10 NOTE — Progress Notes (Signed)
CT surgery p.m. Rounds  Patient was up in chair today with good lower extremity strength Episode of emesis with persistent abdominal mild distention but no significant ileus on KUB Resuming low rate of tube feeds to help with wound healing No bleeding from open wound-currently on heparin 1100 units per hour

## 2015-09-10 NOTE — Progress Notes (Addendum)
Removed pts arterial line at 1015 and held pressure for 25 minutes. Went back to check on pt and he was covered in blood. Held pressure for another 10 minutes and got a CBC. Results pending. Vitals stable. Site is currently clean, dry, and not bleeding. Will continue to monitor closely.

## 2015-09-10 NOTE — Progress Notes (Signed)
Hypoglycemic Event  CBG: 64  Treatment: 4 oz juice  Symptoms: None  Follow-up CBG: Time: 12:30 CBG Result: 68  Possible Reasons for Event: Tube feeds stopped and pt refused dinner  Comments/MD notified: Gave pt another cup of juice - will recheck CBG after 15 minutes    Ihor Dow

## 2015-09-10 NOTE — Progress Notes (Signed)
PT Cancellation Note  Patient Details Name: AIDENN ARTH MRN: [id number] DOB: 1970-07-23   Cancelled Treatment:    Reason Eval/Treat Not Completed: Medical issues which prohibited therapy (Pt aline site will not quit bleeding Will check back as able).  Thanks. Nurse aware.    Irwin Brakeman F 09/10/2015, 12:37 PM Presley Gora,PT Acute Rehabilitation [phone] (pager)

## 2015-09-10 NOTE — Progress Notes (Signed)
HeartMate 2 Rounding Note  Subjective:   Status post HeartMate 2 implant mid 2016 for non-ischemic cardiomyopathy  Infected pump pocket with MSSA bacteremia May 2017 Dressing changes, debridement and wound VAC therapy has been applied previously this admission with minimal wound healing  22 May  patient was taken to or for external wound closure device-ABRA which will bring skin and subcutaneous tissue together over several days. A - cell powder also applied  22 May  Hope to close the abdominal wound with skin sutures in about 1 week.  Patient with systemic sequela of MSSA sepsis, on renal dialysis and  low dose norepinephrine - now weaned off  No neurologic deficit Ileus with pyloric tube feed in place - Cortrak Continuing blood loss from heparin and bleeding from open wound has improved -  holding coumadin until wound is closed in case he needs a muscle flap Will slowly increase heparin dose and monitor for bleeding from abdominal wound, goal is .3 - .4 Xa if tolerated Hb stable this am Milrinone weaned off for Co-ox 70%    LVAD INTERROGATION:  HeartMate II LVAD:  Flow 4.8 liters/min, speed 9200, power 4.9, PI 6.1  Controller in place   Objective:    Vital Signs:   Temp:  [96.9 F (36.1 C)-99 F (37.2 C)] 96.9 F (36.1 C) (05/23 0700) Pulse Rate:  [35-99] 88 (05/23 0700) Resp:  [12-26] 16 (05/23 0700) BP: (94-135)/(69-100) 94/80 mmHg (05/23 0630) SpO2:  [97 %-100 %] 100 % (05/23 0700) Arterial Line BP: (82-153)/(55-89) 91/63 mmHg (05/23 0700) Weight:  [191 lb 2.2 oz (86.7 kg)-197 lb 8.5 oz (89.6 kg)] 191 lb 2.2 oz (86.7 kg) (05/23 0400) Last BM Date: 09/09/15 Mean arterial Pressure 80-90 mmHg  Intake/Output:   Intake/Output Summary (Last 24 hours) at 09/10/15 0902 Last data filed at 09/10/15 0700  Gross per 24 hour  Intake 1778.6 ml  Output   3190 ml  Net -1411.4 ml     Physical Exam: General:  Well appearing. No resp difficulty HEENT: normal Neck: supple.  JVP . Carotids ; no bruits. No lymphadenopathy or thryomegaly appreciated. Cor: Mechanical heart sounds with LVAD hum present. Lungs: clear Abdomen: soft, nontender, nondistended. No hepatosplenomegaly. No bruits or masses. Good bowel sounds.Abdominal dressing intact Extremities: no cyanosis, clubbing, rash, edema Neuro: alert & orientedx3, cranial nerves grossly intact. moves all 4 extremities w/o difficulty. Affect pleasant  Telemetry: Sinus rhythm  Labs: Basic Metabolic Panel:  Recent Labs Lab 09/05/15 0450  09/06/15 0415 09/07/15 0400 09/08/15 1201 09/09/15 1545 09/10/15 0155  NA 134*  < > 138 139 135 139 136  K 3.7  < > 3.3* 3.3* 3.0* 3.4* 3.0*  CL 103  < > 102 103 102 100* 100*  CO2 16*  --  21* 20* 22  --  26  GLUCOSE 99  < > 93 82 105* 82 101*  BUN 36*  < > 29* 44* 53* 26* 32*  CREATININE 7.13*  < > 5.90* 7.49* 7.03* 3.90* 5.54*  CALCIUM 7.8*  --  8.1* 8.4* 8.5*  --  8.1*  MG  --   --   --   --   --   --  2.3  PHOS  --   --   --   --  5.8*  --   --   < > = values in this interval not displayed.  Liver Function Tests:  Recent Labs Lab 09/04/15 0411 09/05/15 0450 09/05/15 1827 09/06/15 0415 09/07/15 0400 09/08/15 1201 09/10/15  0155  AST 46* 41  --  46* 42*  --  61*  ALT 31 20 18 17  8*  --  15*  ALKPHOS 99 96  --  109 92  --  112  BILITOT 9.4* 8.9*  --  9.1* 8.5*  --  8.6*  PROT 5.9* 5.9*  --  5.8* 5.7*  --  5.7*  ALBUMIN 1.8* 1.7*  --  1.7* 1.5* 1.4* 1.5*   No results for input(s): LIPASE, AMYLASE in the last 168 hours. No results for input(s): AMMONIA in the last 168 hours.  CBC:  Recent Labs Lab 09/07/15 1952 09/08/15 0358 09/09/15 0440 09/09/15 1158 09/09/15 1545 09/10/15 0155  WBC 14.9* 14.3* 14.1* 14.9*  --  12.8*  HGB 8.6* 7.9* 7.6* 9.2* 11.2* 8.8*  HCT 25.2* 23.4* 21.8* 27.4* 33.0* 25.3*  MCV 75.9* 74.1* 73.6* 78.3  --  76.2*  PLT 157 174 152 160  --  181    INR: No results for input(s): INR in the last 168 hours.  Other  results:  EKG:   Imaging: Dg Chest Port 1 View  09/09/2015  CLINICAL DATA:  Left ventricular assist device EXAM: PORTABLE CHEST 1 VIEW COMPARISON:  09/07/2015 FINDINGS: Left ventricular assist device unchanged in position. AICD unchanged in position. Left atrial appendage clip unchanged. Right subclavian central venous catheter tip at the cavoatrial junction. Feeding tube in place with the tip not visualized. Mild improvement in diffuse bilateral airspace disease most compatible with resolving edema. Improvement in bibasilar atelectasis. No effusion. IMPRESSION: Left ventricular assist device unchanged. Improvement in pulmonary edema. Electronically Signed   By: Franchot Gallo M.D.   On: 09/09/2015 11:54     Medications:     Scheduled Medications: . sodium chloride   Intravenous Once  . allopurinol  300 mg Oral Daily  . amiodarone  100 mg Oral Daily  .  ceFAZolin (ANCEF) IV  1 g Intravenous Q24H  . citalopram  20 mg Oral Daily  . docusate sodium  200 mg Oral Daily  . feeding supplement (NEPRO CARB STEADY)  1,000 mL Per Tube Q24H  . feeding supplement (NEPRO CARB STEADY)  237 mL Per Tube TID WC  . feeding supplement (PRO-STAT SUGAR FREE 64)  30 mL Per Tube TID  . ferrous sulfate  325 mg Oral BID WC  . guaiFENesin  600 mg Oral BID  . magic mouthwash  10 mL Oral TID  . metoCLOPramide (REGLAN) injection  5 mg Intravenous Q6H  . pantoprazole  40 mg Oral Daily  . potassium chloride  10 mEq Intravenous Q1 Hr x 2  . pregabalin  75 mg Oral BID  . rifampin  300 mg Oral Q8H  . sildenafil  20 mg Oral Q8H  . simethicone  80 mg Oral QID  . sodium chloride flush  10-40 mL Intracatheter Q12H  . sodium chloride flush  3 mL Intravenous Q12H  . tamsulosin  0.4 mg Oral Daily  . vancomycin  125 mg Oral QID    Infusions: . heparin 1,000 Units/hr (09/10/15 0818)    PRN Medications: sodium chloride, sodium chloride, sodium chloride, alteplase, bisacodyl **OR** bisacodyl, fentaNYL (SUBLIMAZE)  injection, gelatin adsorbable, Gerhardt's butt cream, heparin, ondansetron, oxyCODONE, sodium chloride flush, sodium chloride flush, traMADol   Assessment:  MSSA infection of VAD pocket treated with IV antibiotics as well as topical wound therapies Planning for delayed closure of wound with external soft tissue device- ABRA  May30 Acute renal failure on hemodialysis Acute blood loss anemia  from open wound and IV heparin for VAD   Plan/Discussion:   Resume heparin slowly and monitor for bleeding- LDH remaining low Optimize nutrition, mobilize out of bed to chair, incentive spirometry Hold Coumadin until final wound closure next week   I reviewed the LVAD parameters from today, and compared the results to the patient's prior recorded data.  No programming changes were made.  The LVAD is functioning within specified parameters.  The patient performs LVAD self-test daily.  LVAD interrogation was negative for any significant power changes, alarms or PI events/speed drops.  LVAD equipment check completed and is in good working order.  Back-up equipment present.   LVAD education done on emergency procedures and precautions and reviewed exit site care.  Length of Stay: [place] III 09/10/2015, 9:02 AM

## 2015-09-10 NOTE — Anesthesia Postprocedure Evaluation (Signed)
Anesthesia Post Note  Patient: [name]  Procedure(s) Performed: Procedure(s) (LRB): IRRIGATION AND DEBRIDEMENT sternal WOUND (N/A) APPLICATION OF A-CELL OF sternum (N/A) APPLICATION OF WOUND VAC to sternum (N/A) INSERTION OF DIALYSIS CATHETER;RIGHT SUBCLAVIAN (Right)  Patient location during evaluation: PACU Anesthesia Type: General Level of consciousness: awake and alert Pain management: pain level controlled Vital Signs Assessment: post-procedure vital signs reviewed and stable Respiratory status: spontaneous breathing, nonlabored ventilation, respiratory function stable and patient connected to nasal cannula oxygen Cardiovascular status: blood pressure returned to baseline and stable Postop Assessment: no signs of nausea or vomiting Anesthetic complications: no    Last Vitals:  Filed Vitals:   09/10/15 0900 09/10/15 1000  BP: 105/84 108/84  Pulse: 87 85  Temp:    Resp: 18 19    Last Pain:  Filed Vitals:   09/10/15 1053  PainSc: Asleep                 Ryelynn Guedea S

## 2015-09-11 ENCOUNTER — Inpatient Hospital Stay (HOSPITAL_COMMUNITY): Payer: 59

## 2015-09-11 ENCOUNTER — Encounter (HOSPITAL_COMMUNITY): Payer: 59

## 2015-09-11 ENCOUNTER — Encounter (HOSPITAL_COMMUNITY): Payer: Self-pay | Admitting: Plastic Surgery

## 2015-09-11 DIAGNOSIS — Z95811 Presence of heart assist device: Secondary | ICD-10-CM

## 2015-09-11 DIAGNOSIS — I5023 Acute on chronic systolic (congestive) heart failure: Secondary | ICD-10-CM

## 2015-09-11 LAB — COMPREHENSIVE METABOLIC PANEL
ALT: 13 U/L — ABNORMAL LOW (ref 17–63)
AST: 33 U/L (ref 15–41)
Albumin: <1 g/dL — ABNORMAL LOW (ref 3.5–5.0)
Alkaline Phosphatase: 78 U/L (ref 38–126)
Anion gap: 6 (ref 5–15)
BUN: 31 mg/dL — ABNORMAL HIGH (ref 6–20)
CO2: 16 mmol/L — ABNORMAL LOW (ref 22–32)
Calcium: 4.9 mg/dL — CL (ref 8.9–10.3)
Chloride: 119 mmol/L — ABNORMAL HIGH (ref 101–111)
Creatinine, Ser: 4.02 mg/dL — ABNORMAL HIGH (ref 0.61–1.24)
GFR calc Af Amer: 19 mL/min — ABNORMAL LOW (ref >60)
GFR calc non Af Amer: 16 mL/min — ABNORMAL LOW (ref >60)
Glucose, Bld: 84 mg/dL (ref 65–99)
Potassium: <2 mmol/L — CL (ref 3.5–5.1)
Sodium: 141 mmol/L (ref 135–145)
Total Bilirubin: 5.3 mg/dL — ABNORMAL HIGH (ref 0.3–1.2)
Total Protein: 3.2 g/dL — ABNORMAL LOW (ref 6.5–8.1)

## 2015-09-11 LAB — GLUCOSE, CAPILLARY
GLUCOSE-CAPILLARY: 102 mg/dL — AB (ref 65–99)
GLUCOSE-CAPILLARY: 94 mg/dL (ref 65–99)
Glucose-Capillary: 92 mg/dL (ref 65–99)
Glucose-Capillary: 109 mg/dL — ABNORMAL HIGH (ref 65–99)
Glucose-Capillary: 95 mg/dL (ref 65–99)

## 2015-09-11 LAB — MAGNESIUM: MAGNESIUM: 2.7 mg/dL — AB (ref 1.7–2.4)

## 2015-09-11 LAB — CARBOXYHEMOGLOBIN
CARBOXYHEMOGLOBIN: 2.3 % — AB (ref 0.5–1.5)
CARBOXYHEMOGLOBIN: 2 % — AB (ref 0.5–1.5)
METHEMOGLOBIN: 1.4 % (ref 0.0–1.5)
METHEMOGLOBIN: 1.4 % (ref 0.0–1.5)
O2 SAT: 61.8 %
O2 Saturation: 69.5 %
Total hemoglobin: 5.7 g/dL — CL (ref 13.5–18.0)
Total hemoglobin: 10.5 g/dL — ABNORMAL LOW (ref 13.5–18.0)

## 2015-09-11 LAB — BASIC METABOLIC PANEL
Anion gap: 11 (ref 5–15)
BUN: 50 mg/dL — AB (ref 6–20)
CO2: 22 mmol/L (ref 22–32)
Calcium: 8.1 mg/dL — ABNORMAL LOW (ref 8.9–10.3)
Chloride: 103 mmol/L (ref 101–111)
Creatinine, Ser: 6.76 mg/dL — ABNORMAL HIGH (ref 0.61–1.24)
GFR calc non Af Amer: 9 mL/min — ABNORMAL LOW (ref >60)
GFR, EST AFRICAN AMERICAN: 10 mL/min — AB (ref >60)
GLUCOSE: 88 mg/dL (ref 65–99)
POTASSIUM: 3.2 mmol/L — AB (ref 3.5–5.1)
SODIUM: 136 mmol/L (ref 135–145)

## 2015-09-11 LAB — CBC
HCT: 12.3 % — ABNORMAL LOW (ref 39.0–52.0)
HCT: 28.7 % — ABNORMAL LOW (ref 39.0–52.0)
Hemoglobin: 4.2 g/dL — CL (ref 13.0–17.0)
Hemoglobin: 9.7 g/dL — ABNORMAL LOW (ref 13.0–17.0)
MCH: 26.1 pg (ref 26.0–34.0)
MCH: 26.7 pg (ref 26.0–34.0)
MCHC: 34.1 g/dL (ref 30.0–36.0)
MCHC: 33.8 g/dL (ref 30.0–36.0)
MCV: 76.4 fL — ABNORMAL LOW (ref 78.0–100.0)
MCV: 79.1 fL (ref 78.0–100.0)
Platelets: 94 10*3/uL — ABNORMAL LOW (ref 150–400)
Platelets: 153 10*3/uL (ref 150–400)
RBC: 1.61 MIL/uL — ABNORMAL LOW (ref 4.22–5.81)
RBC: 3.63 MIL/uL — ABNORMAL LOW (ref 4.22–5.81)
RDW: 22.3 % — ABNORMAL HIGH (ref 11.5–15.5)
RDW: 20.5 % — ABNORMAL HIGH (ref 11.5–15.5)
WBC: 9.7 10*3/uL (ref 4.0–10.5)
WBC: 17.5 10*3/uL — ABNORMAL HIGH (ref 4.0–10.5)

## 2015-09-11 LAB — LACTATE DEHYDROGENASE: LDH: 140 U/L (ref 98–192)

## 2015-09-11 LAB — PREPARE RBC (CROSSMATCH)

## 2015-09-11 LAB — HEPARIN LEVEL (UNFRACTIONATED): Heparin Unfractionated: <0.1 IU/mL — ABNORMAL LOW (ref 0.30–0.70)

## 2015-09-11 MED ORDER — NEPRO/CARBSTEADY PO LIQD: 1000.0000 mL | ORAL | Status: DC

## 2015-09-11 MED ORDER — ALLOPURINOL 100 MG PO TABS
100.0000 mg | ORAL_TABLET | Freq: Every day | ORAL | Status: DC
  Administered 2015-09-12 – 2015-09-25 (×13): 100 mg via ORAL
  Filled 2015-09-11 (×13): qty 1

## 2015-09-11 MED ORDER — PANTOPRAZOLE SODIUM 40 MG IV SOLR
40.0000 mg | Freq: Every day | INTRAVENOUS | Status: DC
  Administered 2015-09-11: 40 mg via INTRAVENOUS
  Filled 2015-09-11: qty 40

## 2015-09-11 MED ORDER — PREGABALIN 50 MG PO CAPS
75.0000 mg | ORAL_CAPSULE | Freq: Every day | ORAL | Status: DC
  Administered 2015-09-12 – 2015-09-14 (×3): 75 mg via ORAL
  Administered 2015-09-15: 50 mg via ORAL
  Administered 2015-09-15 – 2015-09-25 (×10): 75 mg via ORAL
  Filled 2015-09-11 (×4): qty 3
  Filled 2015-09-11: qty 1
  Filled 2015-09-11 (×2): qty 3
  Filled 2015-09-11 (×2): qty 1
  Filled 2015-09-11: qty 3
  Filled 2015-09-11 (×2): qty 1
  Filled 2015-09-11 (×2): qty 3

## 2015-09-11 MED ORDER — SODIUM CHLORIDE 0.9 % IV SOLN
Freq: Once | INTRAVENOUS | Status: AC
  Administered 2015-09-11: 07:00:00 via INTRAVENOUS

## 2015-09-11 MED ORDER — NEPRO/CARBSTEADY PO LIQD
1000.0000 mL | ORAL | Status: DC
  Administered 2015-09-13 – 2015-09-19 (×4): 1000 mL
  Filled 2015-09-11 (×13): qty 1000

## 2015-09-11 NOTE — Progress Notes (Signed)
2 Days Post-Op  Subjective: Patient went to OR Monday for ABRA placement.  Dressing being changed at bedside.  Today he is looking better overall than yesterday.  PT in room getting ready to work with the patient.   Objective: Vital signs in last 24 hours: Temp:  [97.4 F (36.3 C)-98.7 F (37.1 C)] 97.6 F (36.4 C) (05/24 1005) Pulse Rate:  [42-90] 80 (05/24 1005) Resp:  [10-19] 17 (05/24 1005) BP: (90-127)/(65-94) 121/88 mmHg (05/24 1000) SpO2:  [95 %-100 %] 98 % (05/24 1005) Weight:  [87.3 kg (192 lb 7.4 oz)] 87.3 kg (192 lb 7.4 oz) (05/24 0500) Last BM Date: 09/10/15  Intake/Output from previous day: 05/23 0701 - 05/24 0700 In: 1012.4 [P.O.:100; I.V.:507.4; NG/GT:405] Out: 275 [Urine:275] Intake/Output this shift: Total I/O In: 431 [I.V.:71; Blood:270; NG/GT:90] Out: 60 [Urine:60]  General appearance: alert and cooperative  Lab Results:   Recent Labs  09/10/15 1250 09/10/15 1526 09/11/15 0515  WBC 13.5*  --  9.7  HGB 8.3* 8.5* 4.2*  HCT 24.0* 25.0* 12.3*  PLT 208  --  94*   BMET  Recent Labs  09/10/15 1250 09/10/15 1526 09/11/15 0515  NA 135 136 141  K 3.4* 5.5* <2.0*  CL 104 102 119*  CO2 25  --  16*  GLUCOSE 81 72 84  BUN 39* 37* 31*  CREATININE 5.71* 6.10* 4.02*  CALCIUM 7.8*  --  4.9*   PT/INR No results for input(s): LABPROT, INR in the last 72 hours. ABG  Recent Labs  09/09/15 1145  PHART 7.332*  HCO3 20.2    Studies/Results: Ct Abdomen Wo Contrast  09/11/2015  CLINICAL DATA:  Anemia, abdominal distention. EXAM: CT CHEST, ABDOMEN AND PELVIS WITHOUT CONTRAST TECHNIQUE: Multidetector CT imaging of the chest, abdomen and pelvis was performed following the standard protocol without IV contrast. COMPARISON:  CT scan of November 30, 2014. FINDINGS: CT CHEST No pneumothorax is noted. Mild bilateral pleural effusions are noted with adjacent subsegmental atelectasis. Multifocal densities are noted in both upper lobes concerning for edema or possibly  inflammation. No significant osseous abnormality is noted in the chest. Mild cardiomegaly is noted. Patient is status post left ventricular assistance device placement. Surgical wound in the epigastric region is noted containing debris which may represent inflammatory material. There does not appear to be a significant fluid collection in any deep location. Left subclavian pacemaker is noted. Mildly enlarged adenopathy is noted in the aortopulmonary window which is not significantly changed compared to prior exam and most likely reactive in etiology. Feeding tube is seen passing through the esophagus and stomach into the proximal duodenum. CT ABDOMEN AND PELVIS Status post cholecystectomy. No focal abnormality is noted in the liver, spleen or pancreas on these unenhanced images. Adrenal glands and kidneys appear normal. No hydronephrosis or renal obstruction is noted mild amount of free fluid is seen around the inferior portion of the liver. There is no evidence of bowel obstruction. The appendix appears normal. Urinary bladder is decompressed secondary to Foley catheter. Moderate amount of free fluid is noted in the pelvis with Hounsfield measurement of 11 consistent with fluid density. Mild anasarca is noted. Diverticulosis is noted throughout the colon without inflammation. Mildly enlarged bilateral inguinal lymph nodes are noted which most likely are inflammatory in etiology. IMPRESSION: Mild bilateral pleural effusions are noted with adjacent subsegmental atelectasis. Status post left ventricular assistance device placement. Open surgical wound in the epigastric region contains debris which may represent inflammatory material. Clinical correlation is recommended. No deep  abscess or fluid collection is noted. Diverticulosis is noted throughout the colon without inflammation. Mild anasarca is noted. Mild amount of free fluid is noted around the liver, with moderate amount of free fluid seen in the pelvis. Mildly  enlarged bilateral inguinal adenopathy is noted most likely inflammatory in etiology. Electronically Signed   By: Marijo Conception, M.D.   On: 09/11/2015 10:10   Ct Chest Wo Contrast  09/11/2015  CLINICAL DATA:  Anemia, abdominal distention. EXAM: CT CHEST, ABDOMEN AND PELVIS WITHOUT CONTRAST TECHNIQUE: Multidetector CT imaging of the chest, abdomen and pelvis was performed following the standard protocol without IV contrast. COMPARISON:  CT scan of November 30, 2014. FINDINGS: CT CHEST No pneumothorax is noted. Mild bilateral pleural effusions are noted with adjacent subsegmental atelectasis. Multifocal densities are noted in both upper lobes concerning for edema or possibly inflammation. No significant osseous abnormality is noted in the chest. Mild cardiomegaly is noted. Patient is status post left ventricular assistance device placement. Surgical wound in the epigastric region is noted containing debris which may represent inflammatory material. There does not appear to be a significant fluid collection in any deep location. Left subclavian pacemaker is noted. Mildly enlarged adenopathy is noted in the aortopulmonary window which is not significantly changed compared to prior exam and most likely reactive in etiology. Feeding tube is seen passing through the esophagus and stomach into the proximal duodenum. CT ABDOMEN AND PELVIS Status post cholecystectomy. No focal abnormality is noted in the liver, spleen or pancreas on these unenhanced images. Adrenal glands and kidneys appear normal. No hydronephrosis or renal obstruction is noted mild amount of free fluid is seen around the inferior portion of the liver. There is no evidence of bowel obstruction. The appendix appears normal. Urinary bladder is decompressed secondary to Foley catheter. Moderate amount of free fluid is noted in the pelvis with Hounsfield measurement of 11 consistent with fluid density. Mild anasarca is noted. Diverticulosis is noted  throughout the colon without inflammation. Mildly enlarged bilateral inguinal lymph nodes are noted which most likely are inflammatory in etiology. IMPRESSION: Mild bilateral pleural effusions are noted with adjacent subsegmental atelectasis. Status post left ventricular assistance device placement. Open surgical wound in the epigastric region contains debris which may represent inflammatory material. Clinical correlation is recommended. No deep abscess or fluid collection is noted. Diverticulosis is noted throughout the colon without inflammation. Mild anasarca is noted. Mild amount of free fluid is noted around the liver, with moderate amount of free fluid seen in the pelvis. Mildly enlarged bilateral inguinal adenopathy is noted most likely inflammatory in etiology. Electronically Signed   By: Marijo Conception, M.D.   On: 09/11/2015 10:10   Dg Chest Port 1 View  09/11/2015  CLINICAL DATA:  LEFT ventricular assist device EXAM: PORTABLE CHEST 1 VIEW COMPARISON:  Portable exam 0623 hours compared to 09/09/2015 FINDINGS: RIGHT subclavian central venous catheter tip projecting over cavoatrial junction. Feeding tube extends into stomach. LEFT ventricular assist device identified at the inferior margin of the exam. LEFT subclavian AICD with lead projecting over RIGHT ventricle. Enlargement of cardiac silhouette with pulmonary vascular congestion. Diffuse pulmonary infiltrates likely pulmonary edema. No pneumothorax. IMPRESSION: No interval change in pulmonary edema. Electronically Signed   By: Lavonia Dana M.D.   On: 09/11/2015 07:55   Dg Chest Port 1 View  09/09/2015  CLINICAL DATA:  Left ventricular assist device EXAM: PORTABLE CHEST 1 VIEW COMPARISON:  09/07/2015 FINDINGS: Left ventricular assist device unchanged in position. AICD unchanged in  position. Left atrial appendage clip unchanged. Right subclavian central venous catheter tip at the cavoatrial junction. Feeding tube in place with the tip not visualized.  Mild improvement in diffuse bilateral airspace disease most compatible with resolving edema. Improvement in bibasilar atelectasis. No effusion. IMPRESSION: Left ventricular assist device unchanged. Improvement in pulmonary edema. Electronically Signed   By: Franchot Gallo M.D.   On: 09/09/2015 11:54   Dg Abd Portable 1v  09/11/2015  CLINICAL DATA:  Portable abdominal radiograph, emesis today. EXAM: PORTABLE ABDOMEN - 1 VIEW COMPARISON:  Abdominal radiograph of Sep 10, 2015 FINDINGS: There is mild gaseous distention of the stomach. The feeding tube tip appears to lie in the region of the duodenal bulb. The small and large bowel gas pattern is within the limits of normal. The left ventricular assist device where visualized is appropriately positioned. IMPRESSION: Increased volume of gas within the stomach. The small and large bowel gas pattern remains within the limits of normal. The feeding tube tip lies in the region of the duodenal bulb. Electronically Signed   By: David  Martinique M.D.   On: 09/11/2015 07:53   Dg Abd Portable 1v  09/10/2015  CLINICAL DATA:  Feeding tube placement EXAM: PORTABLE ABDOMEN - 1 VIEW COMPARISON:  Portable exam 1528 hours compared to 09/10/2015 at 1345 hours FINDINGS: Feeding tube traverses stomach with tip projecting over either the duodenal bulb or proximal descending duodenum. LVAD projects over upper abdomen. AICD lead projects over RIGHT ventricle. Visualized bowel gas pattern normal. IMPRESSION: Tip of feeding tube projects over either the duodenal bulb or proximal descending duodenum. Electronically Signed   By: Lavonia Dana M.D.   On: 09/10/2015 15:44   Dg Abd Portable 1v  09/10/2015  CLINICAL DATA:  Abdominal distention and emesis. EXAM: PORTABLE ABDOMEN - 1 VIEW COMPARISON:  One-view abdomen 09/04/2015. FINDINGS: A left ventricular assist device is in place. The bowel gas pattern is normal. Previously noted gastric distention has resolved. There is no obstruction or free  air. IMPRESSION: Normal bowel gas pattern. Electronically Signed   By: San Morelle M.D.   On: 09/10/2015 13:58    Anti-infectives: Anti-infectives    Start     Dose/Rate Route Frequency Ordered Stop   09/11/15 0600  vancomycin (VANCOCIN) 50 mg/mL oral solution 125 mg     125 mg Oral Every 8 hours 09/10/15 2232     09/09/15 2000  ceFAZolin (ANCEF) IVPB 1 g/50 mL premix     1 g 100 mL/hr over 30 Minutes Intravenous Every 24 hours 09/09/15 1239     09/09/15 1011  polymyxin B 500,000 Units, bacitracin 50,000 Units in sodium chloride irrigation 0.9 % 500 mL irrigation  Status:  Discontinued       As needed 09/09/15 1012 09/09/15 1047   09/09/15 0800  ceFAZolin (ANCEF) IVPB 1 g/50 mL premix  Status:  Discontinued     1 g 100 mL/hr over 30 Minutes Intravenous Every 8 hours 09/09/15 0759 09/09/15 1239   09/09/15 0600  ceFAZolin (ANCEF) IVPB 2g/100 mL premix  Status:  Discontinued     2 g 200 mL/hr over 30 Minutes Intravenous To ShortStay Surgical 09/08/15 1415 09/09/15 1049   09/05/15 1426  polymyxin B 500,000 Units, bacitracin 50,000 Units in sodium chloride irrigation 0.9 % 500 mL irrigation  Status:  Discontinued       As needed 09/05/15 1434 09/05/15 1513   09/05/15 1100  cefTRIAXone (ROCEPHIN) 2 g in dextrose 5 % 50 mL IVPB  Status:  Discontinued     2 g 100 mL/hr over 30 Minutes Intravenous Every 24 hours 09/05/15 0948 09/09/15 1238   09/05/15 1045  ceFAZolin (ANCEF) IVPB 2g/100 mL premix     2 g 200 mL/hr over 30 Minutes Intravenous To ShortStay Surgical 09/04/15 2206 09/05/15 1345   09/04/15 1000  imipenem-cilastatin (PRIMAXIN) 500 mg in sodium chloride 0.9 % 100 mL IVPB  Status:  Discontinued     500 mg 200 mL/hr over 30 Minutes Intravenous Every 12 hours 09/03/15 1817 09/05/15 0948   09/04/15 0600  vancomycin (VANCOCIN) IVPB 1000 mg/200 mL premix  Status:  Discontinued     1,000 mg 200 mL/hr over 60 Minutes Intravenous Every 24 hours 09/03/15 0757 09/03/15 0953   09/03/15  1518  vancomycin (VANCOCIN) 1,000 mg in sodium chloride 0.9 % 1,000 mL irrigation  Status:  Discontinued       As needed 09/03/15 1518 09/03/15 1525   09/03/15 1330  vancomycin (VANCOCIN) 1,000 mg in sodium chloride 0.9 % 1,000 mL irrigation  Status:  Discontinued      Irrigation To Surgery 09/03/15 1325 09/03/15 1628   09/03/15 1200  imipenem-cilastatin (PRIMAXIN) 250 mg in sodium chloride 0.9 % 100 mL IVPB  Status:  Discontinued     250 mg 200 mL/hr over 30 Minutes Intravenous Every 6 hours 09/03/15 0749 09/03/15 1817   09/03/15 0200  vancomycin (VANCOCIN) IVPB 750 mg/150 ml premix  Status:  Discontinued     750 mg 150 mL/hr over 60 Minutes Intravenous Every 12 hours 09/02/15 1234 09/03/15 0745   09/02/15 1400  vancomycin (VANCOCIN) IVPB 1000 mg/200 mL premix     1,000 mg 200 mL/hr over 60 Minutes Intravenous  Once 09/02/15 1234 09/02/15 1430   09/02/15 0900  imipenem-cilastatin (PRIMAXIN) 500 mg in sodium chloride 0.9 % 100 mL IVPB  Status:  Discontinued     500 mg 200 mL/hr over 30 Minutes Intravenous Every 8 hours 09/02/15 0814 09/03/15 0749   08/30/15 0400  vancomycin (VANCOCIN) 1,250 mg in sodium chloride 0.9 % 250 mL IVPB  Status:  Discontinued     1,250 mg 166.7 mL/hr over 90 Minutes Intravenous To Surgery 08/29/15 0611 08/29/15 1012   08/30/15 0400  cefUROXime (ZINACEF) 1.5 g in dextrose 5 % 50 mL IVPB  Status:  Discontinued     1.5 g 100 mL/hr over 30 Minutes Intravenous To Surgery 08/29/15 0611 08/29/15 1012   08/30/15 0400  fluconazole (DIFLUCAN) IVPB 400 mg  Status:  Discontinued     400 mg 100 mL/hr over 120 Minutes Intravenous To Surgery 08/29/15 0611 08/29/15 1012   08/30/15 0400  rifampin (RIFADIN) 600 mg in sodium chloride 0.9 % 100 mL IVPB  Status:  Discontinued     600 mg 200 mL/hr over 30 Minutes Intravenous To Surgery 08/29/15 0611 08/29/15 1012   08/29/15 0845  vancomycin (VANCOCIN) 1,000 mg in sodium chloride 0.9 % 1,000 mL irrigation  Status:  Discontinued       Irrigation To Surgery 08/29/15 0834 08/29/15 1012   08/29/15 0615  cefUROXime (ZINACEF) 750 mg in dextrose 5 % 50 mL IVPB  Status:  Discontinued     750 mg 100 mL/hr over 30 Minutes Intravenous To Surgery 08/29/15 0609 08/29/15 1012   08/29/15 0615  vancomycin (VANCOCIN) powder 1,000 mg     1,000 mg Other To Surgery 08/29/15 0609 08/29/15 0900   08/27/15 2300  vancomycin (VANCOCIN) 50 mg/mL oral solution 125 mg  Status:  Discontinued  125 mg Oral Every 6 hours 08/26/15 2200 08/27/15 0807   08/27/15 1601  vancomycin (VANCOCIN) 1,000 mg in sodium chloride 0.9 % 1,000 mL irrigation  Status:  Discontinued       As needed 08/27/15 1601 08/27/15 1740   08/27/15 1500  vancomycin (VANCOCIN) 1,000 mg in sodium chloride 0.9 % 1,000 mL irrigation  Status:  Discontinued      Irrigation To Surgery 08/27/15 1449 08/27/15 1748   08/27/15 1400  rifampin (RIFADIN) capsule 300 mg     300 mg Oral Every 8 hours 08/27/15 1141     08/27/15 1100  vancomycin (VANCOCIN) 50 mg/mL oral solution 125 mg     125 mg Oral 4 times daily 08/27/15 1052 09/10/15 0959   08/26/15 2145  nafcillin 2 g in dextrose 5 % 100 mL IVPB  Status:  Discontinued     2 g 200 mL/hr over 30 Minutes Intravenous Every 4 hours 08/26/15 2104 09/02/15 0850      Assessment/Plan: s/p Procedure(s): DEBRIDEMENT AND CLOSURE WOUND WITH PLACEMENT OF ABRA CLOSURE DEVICE (N/A) Continue with the ABRA, will change dressing after PT.  LOS: 16 days    [name] 09/11/2015

## 2015-09-11 NOTE — Progress Notes (Signed)
HeartMate 2 Rounding Note  Subjective:   Status post HeartMate 2 implant mid 2016 for non-ischemic cardiomyopathy  Infected pump pocket with MSSA bacteremia May 2017 Dressing changes, debridement and wound VAC therapy has been applied previously this admission with minimal wound healing  22 May  patient was taken to or for external wound closure device-ABRA which will bring skin and subcutaneous tissue together over several days. A - cell powder also applied  22 May  Hope to close the abdominal wound with skin sutures in about 1 week.  Patient with systemic sequela of MSSA sepsis, on renal dialysis and  low dose norepinephrine - now weaned off  No neurologic deficit Ileus improved  with pyloric tube feed in place - Cortrak Continuing blood loss from heparin and bleeding from open wound has improved -  holding coumadin until wound is closed in case he needs a muscle flap  Hb this am < 7.0 -  Will transfuse and stop heparin 24 hrs Milrinone weaned off for Co-ox 70%    LVAD INTERROGATION:  HeartMate II LVAD:  Flow 4.8 liters/min, speed 9200, power 4.9, PI 6.1  Controller in place   Objective:    Vital Signs:   Temp:  [97.4 F (36.3 C)-98.7 F (37.1 C)] 97.9 F (36.6 C) (05/24 1633) Pulse Rate:  [78-90] 81 (05/24 1730) Resp:  [11-21] 21 (05/24 1730) BP: (95-135)/(72-103) 135/91 mmHg (05/24 1730) SpO2:  [95 %-100 %] 98 % (05/24 1730) Weight:  [192 lb 7.4 oz (87.3 kg)] 192 lb 7.4 oz (87.3 kg) (05/24 0500) Last BM Date: 09/10/15 Mean arterial Pressure 80-90 mmHg  Intake/Output:   Intake/Output Summary (Last 24 hours) at 09/11/15 1839 Last data filed at 09/11/15 1700  Gross per 24 hour  Intake   2461 ml  Output    265 ml  Net   2196 ml     Physical Exam: General:  Well appearing. No resp difficulty HEENT: normal Neck: supple. JVP . Carotids ; no bruits. No lymphadenopathy or thryomegaly appreciated. Cor: Mechanical heart sounds with LVAD hum present. Lungs:  clear Abdomen: soft, nontender, nondistended. No hepatosplenomegaly. No bruits or masses. Good bowel sounds.Abdominal dressing intact Extremities: no cyanosis, clubbing, rash, edema Neuro: alert & orientedx3, cranial nerves grossly intact. moves all 4 extremities w/o difficulty. Affect pleasant  Telemetry: Sinus rhythm  Labs: Basic Metabolic Panel:  Recent Labs Lab 09/08/15 1201  09/10/15 0155 09/10/15 1025 09/10/15 1250 09/10/15 1526 09/11/15 0515 09/11/15 0820  NA 135  < > 136  --  135 136 141 136  K 3.0*  < > 3.0* 3.3* 3.4* 5.5* <2.0* 3.2*  CL 102  < > 100*  --  104 102 119* 103  CO2 22  --  26  --  25  --  16* 22  GLUCOSE 105*  < > 101*  --  81 72 84 88  BUN 53*  < > 32*  --  39* 37* 31* 50*  CREATININE 7.03*  < > 5.54*  --  5.71* 6.10* 4.02* 6.76*  CALCIUM 8.5*  --  8.1*  --  7.8*  --  4.9* 8.1*  MG  --   --  2.3  --   --   --   --  2.7*  PHOS 5.8*  --   --   --   --   --   --   --   < > = values in this interval not displayed.  Liver Function Tests:  Recent Labs Lab  09/05/15 0450 09/05/15 1827 09/06/15 0415 09/07/15 0400 09/08/15 1201 09/10/15 0155 09/11/15 0515  AST 41  --  46* 42*  --  61* 33  ALT 20 18 17  8*  --  15* 13*  ALKPHOS 96  --  109 92  --  112 78  BILITOT 8.9*  --  9.1* 8.5*  --  8.6* 5.3*  PROT 5.9*  --  5.8* 5.7*  --  5.7* 3.2*  ALBUMIN 1.7*  --  1.7* 1.5* 1.4* 1.5* <1.0*   No results for input(s): LIPASE, AMYLASE in the last 168 hours. No results for input(s): AMMONIA in the last 168 hours.  CBC:  Recent Labs Lab 09/09/15 1158  09/10/15 0155 09/10/15 1250 09/10/15 1526 09/11/15 0515 09/11/15 1300  WBC 14.9*  --  12.8* 13.5*  --  9.7 17.5*  HGB 9.2*  < > 8.8* 8.3* 8.5* 4.2* 9.7*  HCT 27.4*  < > 25.3* 24.0* 25.0* 12.3* 28.7*  MCV 78.3  --  76.2* 77.2*  --  76.4* 79.1  PLT 160  --  181 208  --  94* 153  < > = values in this interval not displayed.  INR: No results for input(s): INR in the last 168 hours.  Other  results:  EKG:   Imaging: Ct Abdomen Wo Contrast  09/11/2015  CLINICAL DATA:  Anemia, abdominal distention. EXAM: CT CHEST, ABDOMEN AND PELVIS WITHOUT CONTRAST TECHNIQUE: Multidetector CT imaging of the chest, abdomen and pelvis was performed following the standard protocol without IV contrast. COMPARISON:  CT scan of November 30, 2014. FINDINGS: CT CHEST No pneumothorax is noted. Mild bilateral pleural effusions are noted with adjacent subsegmental atelectasis. Multifocal densities are noted in both upper lobes concerning for edema or possibly inflammation. No significant osseous abnormality is noted in the chest. Mild cardiomegaly is noted. Patient is status post left ventricular assistance device placement. Surgical wound in the epigastric region is noted containing debris which may represent inflammatory material. There does not appear to be a significant fluid collection in any deep location. Left subclavian pacemaker is noted. Mildly enlarged adenopathy is noted in the aortopulmonary window which is not significantly changed compared to prior exam and most likely reactive in etiology. Feeding tube is seen passing through the esophagus and stomach into the proximal duodenum. CT ABDOMEN AND PELVIS Status post cholecystectomy. No focal abnormality is noted in the liver, spleen or pancreas on these unenhanced images. Adrenal glands and kidneys appear normal. No hydronephrosis or renal obstruction is noted mild amount of free fluid is seen around the inferior portion of the liver. There is no evidence of bowel obstruction. The appendix appears normal. Urinary bladder is decompressed secondary to Foley catheter. Moderate amount of free fluid is noted in the pelvis with Hounsfield measurement of 11 consistent with fluid density. Mild anasarca is noted. Diverticulosis is noted throughout the colon without inflammation. Mildly enlarged bilateral inguinal lymph nodes are noted which most likely are inflammatory in  etiology. IMPRESSION: Mild bilateral pleural effusions are noted with adjacent subsegmental atelectasis. Status post left ventricular assistance device placement. Open surgical wound in the epigastric region contains debris which may represent inflammatory material. Clinical correlation is recommended. No deep abscess or fluid collection is noted. Diverticulosis is noted throughout the colon without inflammation. Mild anasarca is noted. Mild amount of free fluid is noted around the liver, with moderate amount of free fluid seen in the pelvis. Mildly enlarged bilateral inguinal adenopathy is noted most likely inflammatory in etiology. Electronically Signed  By: Marijo Conception, M.D.   On: 09/11/2015 10:10   Ct Chest Wo Contrast  09/11/2015  CLINICAL DATA:  Anemia, abdominal distention. EXAM: CT CHEST, ABDOMEN AND PELVIS WITHOUT CONTRAST TECHNIQUE: Multidetector CT imaging of the chest, abdomen and pelvis was performed following the standard protocol without IV contrast. COMPARISON:  CT scan of November 30, 2014. FINDINGS: CT CHEST No pneumothorax is noted. Mild bilateral pleural effusions are noted with adjacent subsegmental atelectasis. Multifocal densities are noted in both upper lobes concerning for edema or possibly inflammation. No significant osseous abnormality is noted in the chest. Mild cardiomegaly is noted. Patient is status post left ventricular assistance device placement. Surgical wound in the epigastric region is noted containing debris which may represent inflammatory material. There does not appear to be a significant fluid collection in any deep location. Left subclavian pacemaker is noted. Mildly enlarged adenopathy is noted in the aortopulmonary window which is not significantly changed compared to prior exam and most likely reactive in etiology. Feeding tube is seen passing through the esophagus and stomach into the proximal duodenum. CT ABDOMEN AND PELVIS Status post cholecystectomy. No focal  abnormality is noted in the liver, spleen or pancreas on these unenhanced images. Adrenal glands and kidneys appear normal. No hydronephrosis or renal obstruction is noted mild amount of free fluid is seen around the inferior portion of the liver. There is no evidence of bowel obstruction. The appendix appears normal. Urinary bladder is decompressed secondary to Foley catheter. Moderate amount of free fluid is noted in the pelvis with Hounsfield measurement of 11 consistent with fluid density. Mild anasarca is noted. Diverticulosis is noted throughout the colon without inflammation. Mildly enlarged bilateral inguinal lymph nodes are noted which most likely are inflammatory in etiology. IMPRESSION: Mild bilateral pleural effusions are noted with adjacent subsegmental atelectasis. Status post left ventricular assistance device placement. Open surgical wound in the epigastric region contains debris which may represent inflammatory material. Clinical correlation is recommended. No deep abscess or fluid collection is noted. Diverticulosis is noted throughout the colon without inflammation. Mild anasarca is noted. Mild amount of free fluid is noted around the liver, with moderate amount of free fluid seen in the pelvis. Mildly enlarged bilateral inguinal adenopathy is noted most likely inflammatory in etiology. Electronically Signed   By: Marijo Conception, M.D.   On: 09/11/2015 10:10   Dg Chest Port 1 View  09/11/2015  CLINICAL DATA:  LEFT ventricular assist device EXAM: PORTABLE CHEST 1 VIEW COMPARISON:  Portable exam 0623 hours compared to 09/09/2015 FINDINGS: RIGHT subclavian central venous catheter tip projecting over cavoatrial junction. Feeding tube extends into stomach. LEFT ventricular assist device identified at the inferior margin of the exam. LEFT subclavian AICD with lead projecting over RIGHT ventricle. Enlargement of cardiac silhouette with pulmonary vascular congestion. Diffuse pulmonary infiltrates  likely pulmonary edema. No pneumothorax. IMPRESSION: No interval change in pulmonary edema. Electronically Signed   By: Lavonia Dana M.D.   On: 09/11/2015 07:55   Dg Abd Portable 1v  09/11/2015  CLINICAL DATA:  Portable abdominal radiograph, emesis today. EXAM: PORTABLE ABDOMEN - 1 VIEW COMPARISON:  Abdominal radiograph of Sep 10, 2015 FINDINGS: There is mild gaseous distention of the stomach. The feeding tube tip appears to lie in the region of the duodenal bulb. The small and large bowel gas pattern is within the limits of normal. The left ventricular assist device where visualized is appropriately positioned. IMPRESSION: Increased volume of gas within the stomach. The small and large bowel  gas pattern remains within the limits of normal. The feeding tube tip lies in the region of the duodenal bulb. Electronically Signed   By: David  Martinique M.D.   On: 09/11/2015 07:53   Dg Abd Portable 1v  09/10/2015  CLINICAL DATA:  Feeding tube placement EXAM: PORTABLE ABDOMEN - 1 VIEW COMPARISON:  Portable exam 1528 hours compared to 09/10/2015 at 1345 hours FINDINGS: Feeding tube traverses stomach with tip projecting over either the duodenal bulb or proximal descending duodenum. LVAD projects over upper abdomen. AICD lead projects over RIGHT ventricle. Visualized bowel gas pattern normal. IMPRESSION: Tip of feeding tube projects over either the duodenal bulb or proximal descending duodenum. Electronically Signed   By: Lavonia Dana M.D.   On: 09/10/2015 15:44   Dg Abd Portable 1v  09/10/2015  CLINICAL DATA:  Abdominal distention and emesis. EXAM: PORTABLE ABDOMEN - 1 VIEW COMPARISON:  One-view abdomen 09/04/2015. FINDINGS: A left ventricular assist device is in place. The bowel gas pattern is normal. Previously noted gastric distention has resolved. There is no obstruction or free air. IMPRESSION: Normal bowel gas pattern. Electronically Signed   By: San Morelle M.D.   On: 09/10/2015 13:58     Medications:      Scheduled Medications: . sodium chloride   Intravenous Once  . [START ON 09/12/2015] allopurinol  100 mg Oral Daily  . amiodarone  100 mg Oral Daily  .  ceFAZolin (ANCEF) IV  1 g Intravenous Q24H  . citalopram  20 mg Oral Daily  . docusate sodium  200 mg Oral Daily  . feeding supplement (PRO-STAT SUGAR FREE 64)  30 mL Per Tube TID  . ferrous sulfate  325 mg Oral BID WC  . guaiFENesin  600 mg Oral BID  . magic mouthwash  10 mL Oral TID  . metoCLOPramide (REGLAN) injection  5 mg Intravenous Q6H  . pantoprazole (PROTONIX) IV  40 mg Intravenous Daily  . [START ON 09/12/2015] pregabalin  75 mg Oral Daily  . rifampin  300 mg Oral Q8H  . sildenafil  20 mg Oral Q8H  . simethicone  80 mg Oral QID  . sodium chloride flush  10-40 mL Intracatheter Q12H  . sodium chloride flush  3 mL Intravenous Q12H  . tamsulosin  0.4 mg Oral Daily  . vancomycin  125 mg Oral Q8H    Infusions: . feeding supplement (NEPRO CARB STEADY)      PRN Medications: sodium chloride, sodium chloride, sodium chloride, alteplase, bisacodyl **OR** bisacodyl, fentaNYL (SUBLIMAZE) injection, gelatin adsorbable, Gerhardt's butt cream, ondansetron, oxyCODONE, sodium chloride flush, sodium chloride flush, traMADol   Assessment:  MSSA infection of VAD pocket treated with IV antibiotics as well as topical wound therapies Planning for delayed closure of wound with external soft tissue device- ABRA  May30 Acute renal failure on hemodialysis Acute blood loss anemia from open wound and IV heparin for VAD- now on hold with sig drop in Hb   Plan/Discussion:   Hold heparin and monitor for bleeding- LDH remaining low Optimize nutrition, mobilize out of bed to chair, incentive spirometry Hold Coumadin until final wound closure next week   I reviewed the LVAD parameters from today, and compared the results to the patient's prior recorded data.  No programming changes were made.  The LVAD is functioning within specified  parameters.  The patient performs LVAD self-test daily.  LVAD interrogation was negative for any significant power changes, alarms or PI events/speed drops.  LVAD equipment check completed and is in good  working order.  Back-up equipment present.   LVAD education done on emergency procedures and precautions and reviewed exit site care.  Length of Stay: Irving III 09/11/2015, 6:39 PM

## 2015-09-11 NOTE — Progress Notes (Signed)
Physical Therapy Treatment Patient Details Name: [name] MRN: [id number] DOB: 1970-07-17 Today's Date: 09/11/2015    History of Present Illness [name] is a 45 y.o. 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 TEE on 5/11 negative 5/22 DEBRIDEMENT AND CLOSURE WOUND WITH PLACEMENT OF ABRA CLOSURE DEVICE     PT Comments    Pt admitted with above diagnosis. Pt currently with functional limitations due to balance and endurance deficits. Pt was able to ambulate with RW with several sitting rest breaks.  Tolerated well.   Pt will benefit from skilled PT to increase their independence and safety with mobility to allow discharge to the venue listed below.    Follow Up Recommendations  Supervision/Assistance - 24 hour;CIR     Equipment Recommendations  Other (comment) (TBA)    Recommendations for Other Services       Precautions / Restrictions Precautions Precautions: Fall Restrictions Weight Bearing Restrictions: No    Mobility  Bed Mobility Overal bed mobility: Needs Assistance;+2 for physical assistance Bed Mobility: Rolling;Sidelying to Sit Rolling: Mod assist Sidelying to sit: Mod assist;+2 for safety/equipment;HOB elevated       General bed mobility comments: rolling to protect incision; pt with good effort to push to sitting  Transfers Overall transfer level: Needs assistance Equipment used: Rolling walker (2 wheeled) Transfers: Sit to/from Stand Sit to Stand: Min assist;+2 physical assistance;+2 safety/equipment         General transfer comment: assist slightly to power up.  Ambulation/Gait Ambulation/Gait assistance: Min guard;Min assist;+2 safety/equipment Ambulation Distance (Feet): 140 Feet (50 feet then 75 feet then 15 feet.) Assistive device: Rolling walker (2 wheeled) Gait  Pattern/deviations: Step-through pattern;Decreased stride length;Trunk flexed;Wide base of support   Gait velocity interpretation: Below normal speed for age/gender General Gait Details: Needed several sitting rest breaks.  Followed pt with chair.  Pt needing mostly cues only and steadying assist.    Stairs            Wheelchair Mobility    Modified Rankin (Stroke Patients Only)       Balance Overall balance assessment: Needs assistance Sitting-balance support: No upper extremity supported;Feet supported Sitting balance-Leahy Scale: Fair     Standing balance support: Bilateral upper extremity supported;During functional activity Standing balance-Leahy Scale: Poor Standing balance comment: relies on UEs on RW for stability in standing.                     Cognition Arousal/Alertness: Awake/alert Behavior During Therapy: Flat affect Overall Cognitive Status: Within Functional Limits for tasks assessed                      Exercises General Exercises - Lower Extremity Long Arc Quad: AROM;Both;10 reps;Seated    General Comments        Pertinent Vitals/Pain Pain Assessment: No/denies pain  VSS    Home Living                      Prior Function            PT Goals (current goals can now be found in the care plan section) Progress towards PT goals: Progressing toward goals    Frequency  Min 3X/week    PT Plan Current plan remains appropriate    Co-evaluation  End of Session Equipment Utilized During Treatment: Gait belt Activity Tolerance: Patient limited by fatigue Patient left: with call bell/phone within reach;in bed;with nursing/sitter in room     Time: 1028-1120 PT Time Calculation (min) (ACUTE ONLY): 52 min  Charges:  $Gait Training: 23-37 mins $Therapeutic Exercise: 8-22 mins                    G CodesDenice Paradise 2015/09/19, 2:37 PM [employer] Acute  Rehabilitation [phone] (pager)

## 2015-09-11 NOTE — Progress Notes (Signed)
Patient ID: [name], male   DOB: [birth year], 45 y.o.   MRN: [id number]    HeartMate 2 Rounding Note  Subjective:    Admitted from [facility] in [place] with driveline abscess. Blood cultures --> MSSA and has C diff. Remains on Nafcillin  and oral vancomycin.   08/27/15 S/P I &D Epigastric with hardware exposed and VAC placement.  5/11 repeat I&D and wound vac replacement.  5/11 TEE no vegetations on valves or ICD wire 5/22 S/P ABRA and Acell   Yesterday milrinone was stopped and he was started on sildenafil. Also vomited after eating. KUB was ok. A-line was removed and had bleeding from site after pressure was held for 20 minutes. Hgb this morning 4.2 - receiving 2UPRBCs.   LVAD INTERROGATION:  HeartMate II LVAD:  Flow 4.9 liters/min, speed 9200, power 5.5   PI 7.3   PI events.   Objective:    Vital Signs:   Temp:  [97.4 F (36.3 C)-98.7 F (37.1 C)] 97.4 F (36.3 C) (05/24 0758) Pulse Rate:  [42-87] 83 (05/24 0700) Resp:  [10-19] 17 (05/24 0700) BP: (90-127)/(65-94) 120/80 mmHg (05/24 0700) SpO2:  [95 %-100 %] 97 % (05/24 0700) Arterial Line BP: (107-112)/(66-69) 107/66 mmHg (05/23 1000) Weight:  [192 lb 7.4 oz (87.3 kg)] 192 lb 7.4 oz (87.3 kg) (05/24 0500) Last BM Date: 09/10/15 Mean arterial Pressure  80-90s   Intake/Output:   Intake/Output Summary (Last 24 hours) at 09/11/15 0807 Last data filed at 09/11/15 0600  Gross per 24 hour  Intake 924.95 ml  Output    255 ml  Net 669.95 ml     Physical Exam: CVP 9 General: In bed. NAD HEENT: Scleral icterus Neck: supple.  JVP 9-10 Carotids 2+ bilat; no bruits. No lymphadenopathy or thryomegaly appreciated. R subclavian trialysis cath dressing dry Cor: Mechanical heart sounds with LVAD hum present. Lungs: clear on  Abdomen: soft, tender, distended. Sluggish bowel sounds. Mid epigastric dressing ok.   Driveline: C/D/I; securement device intact and driveline incorporated Extremities: no cyanosis,  clubbing, rash, 1+ edema R and LLE SCDs.  Neuro: A & O x3  Telemetry: NSR 80s  NSVT   Labs: Basic Metabolic Panel:  Recent Labs Lab 09/07/15 0400 09/08/15 1201 09/09/15 1545 09/10/15 0155 09/10/15 1025 09/10/15 1250 09/10/15 1526 09/11/15 0515  NA 139 135 139 136  --  135 136 141  K 3.3* 3.0* 3.4* 3.0* 3.3* 3.4* 5.5* <2.0*  CL 103 102 100* 100*  --  104 102 119*  CO2 20* 22  --  26  --  25  --  16*  GLUCOSE 82 105* 82 101*  --  81 72 84  BUN 44* 53* 26* 32*  --  39* 37* 31*  CREATININE 7.49* 7.03* 3.90* 5.54*  --  5.71* 6.10* 4.02*  CALCIUM 8.4* 8.5*  --  8.1*  --  7.8*  --  4.9*  MG  --   --   --  2.3  --   --   --   --   PHOS  --  5.8*  --   --   --   --   --   --     Liver Function Tests:  Recent Labs Lab 09/05/15 0450 09/05/15 1827 09/06/15 0415 09/07/15 0400 09/08/15 1201 09/10/15 0155 09/11/15 0515  AST 41  --  46* 42*  --  61* 33  ALT 20 18 17  8*  --  15* 13*  ALKPHOS 96  --  109 92  --  112 78  BILITOT 8.9*  --  9.1* 8.5*  --  8.6* 5.3*  PROT 5.9*  --  5.8* 5.7*  --  5.7* 3.2*  ALBUMIN 1.7*  --  1.7* 1.5* 1.4* 1.5* <1.0*   No results for input(s): LIPASE, AMYLASE in the last 168 hours. No results for input(s): AMMONIA in the last 168 hours.  CBC:  Recent Labs Lab 09/09/15 0440 09/09/15 1158 09/09/15 1545 09/10/15 0155 09/10/15 1250 09/10/15 1526 09/11/15 0515  WBC 14.1* 14.9*  --  12.8* 13.5*  --  9.7  HGB 7.6* 9.2* 11.2* 8.8* 8.3* 8.5* 4.2*  HCT 21.8* 27.4* 33.0* 25.3* 24.0* 25.0* 12.3*  MCV 73.6* 78.3  --  76.2* 77.2*  --  76.4*  PLT 152 160  --  181 208  --  94*    INR: No results for input(s): INR in the last 168 hours.  Other results:    Imaging: Dg Chest Port 1 View  09/11/2015  CLINICAL DATA:  LEFT ventricular assist device EXAM: PORTABLE CHEST 1 VIEW COMPARISON:  Portable exam 0623 hours compared to 09/09/2015 FINDINGS: RIGHT subclavian central venous catheter tip projecting over cavoatrial junction. Feeding tube extends  into stomach. LEFT ventricular assist device identified at the inferior margin of the exam. LEFT subclavian AICD with lead projecting over RIGHT ventricle. Enlargement of cardiac silhouette with pulmonary vascular congestion. Diffuse pulmonary infiltrates likely pulmonary edema. No pneumothorax. IMPRESSION: No interval change in pulmonary edema. Electronically Signed   By: Lavonia Dana M.D.   On: 09/11/2015 07:55   Dg Chest Port 1 View  09/09/2015  CLINICAL DATA:  Left ventricular assist device EXAM: PORTABLE CHEST 1 VIEW COMPARISON:  09/07/2015 FINDINGS: Left ventricular assist device unchanged in position. AICD unchanged in position. Left atrial appendage clip unchanged. Right subclavian central venous catheter tip at the cavoatrial junction. Feeding tube in place with the tip not visualized. Mild improvement in diffuse bilateral airspace disease most compatible with resolving edema. Improvement in bibasilar atelectasis. No effusion. IMPRESSION: Left ventricular assist device unchanged. Improvement in pulmonary edema. Electronically Signed   By: Franchot Gallo M.D.   On: 09/09/2015 11:54   Dg Abd Portable 1v  09/11/2015  CLINICAL DATA:  Portable abdominal radiograph, emesis today. EXAM: PORTABLE ABDOMEN - 1 VIEW COMPARISON:  Abdominal radiograph of Sep 10, 2015 FINDINGS: There is mild gaseous distention of the stomach. The feeding tube tip appears to lie in the region of the duodenal bulb. The small and large bowel gas pattern is within the limits of normal. The left ventricular assist device where visualized is appropriately positioned. IMPRESSION: Increased volume of gas within the stomach. The small and large bowel gas pattern remains within the limits of normal. The feeding tube tip lies in the region of the duodenal bulb. Electronically Signed   By: David  Martinique M.D.   On: 09/11/2015 07:53   Dg Abd Portable 1v  09/10/2015  CLINICAL DATA:  Feeding tube placement EXAM: PORTABLE ABDOMEN - 1 VIEW  COMPARISON:  Portable exam 1528 hours compared to 09/10/2015 at 1345 hours FINDINGS: Feeding tube traverses stomach with tip projecting over either the duodenal bulb or proximal descending duodenum. LVAD projects over upper abdomen. AICD lead projects over RIGHT ventricle. Visualized bowel gas pattern normal. IMPRESSION: Tip of feeding tube projects over either the duodenal bulb or proximal descending duodenum. Electronically Signed   By: Lavonia Dana M.D.   On: 09/10/2015 15:44   Dg Abd Portable 1v  09/10/2015  CLINICAL DATA:  Abdominal distention and emesis. EXAM: PORTABLE ABDOMEN - 1 VIEW COMPARISON:  One-view abdomen 09/04/2015. FINDINGS: A left ventricular assist device is in place. The bowel gas pattern is normal. Previously noted gastric distention has resolved. There is no obstruction or free air. IMPRESSION: Normal bowel gas pattern. Electronically Signed   By: San Morelle M.D.   On: 09/10/2015 13:58     Medications:     Scheduled Medications: . sodium chloride   Intravenous Once  . allopurinol  300 mg Oral Daily  . amiodarone  100 mg Oral Daily  .  ceFAZolin (ANCEF) IV  1 g Intravenous Q24H  . citalopram  20 mg Oral Daily  . docusate sodium  200 mg Oral Daily  . feeding supplement (NEPRO CARB STEADY)  1,000 mL Per Tube Q24H  . feeding supplement (NEPRO CARB STEADY)  237 mL Per Tube TID WC  . feeding supplement (PRO-STAT SUGAR FREE 64)  30 mL Per Tube TID  . ferrous sulfate  325 mg Oral BID WC  . guaiFENesin  600 mg Oral BID  . magic mouthwash  10 mL Oral TID  . metoCLOPramide (REGLAN) injection  5 mg Intravenous Q6H  . pantoprazole  40 mg Oral Daily  . pregabalin  75 mg Oral BID  . rifampin  300 mg Oral Q8H  . sildenafil  20 mg Oral Q8H  . simethicone  80 mg Oral QID  . sodium chloride flush  10-40 mL Intracatheter Q12H  . sodium chloride flush  3 mL Intravenous Q12H  . tamsulosin  0.4 mg Oral Daily  . vancomycin  125 mg Oral Q8H    Infusions: . heparin 1,100  Units/hr (09/11/15 0600)    PRN Medications: sodium chloride, sodium chloride, sodium chloride, alteplase, bisacodyl **OR** bisacodyl, fentaNYL (SUBLIMAZE) injection, gelatin adsorbable, Gerhardt's butt cream, heparin, ondansetron, oxyCODONE, sodium chloride flush, sodium chloride flush, traMADol   Assessment:   1. LVAD Complication- Driveline abscess 2. MSSA bacteremia -> septic shock 3. Acute/Chronic systolic HF s/p VAD placement 9/16 4. C. Difficile colitis 5. 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 Acute blood loss anemia 8. Severe Malnutrition.   9.. ARF 10. Klebsiella (ESBL) pneumonia 11. NSVT  12. Hypokalemia  13. RV Failure   Plan/Discussion:    S/P I&D LVAD driveline abscess. Exposed hardware.   09/09/15 09/09/15  S/P ABRA and reapplication of ACell. WBC coming down. Abd distended. Sluggish bowel sounds. KUB ok. CT abdomen. Has not had BM in 24 hours.   ID following. Now on ceftriaxone for klebsiella PNA and MSSA. Oral vanc for c.diff.   Over night with NSVT. K and Mag pending.  CO-OX 70% off milrinone.   Has RV failure. Maps 80-90s. Continue 20 mg sildenafil.   Tolerating HD. Making some urine.   VAD parameters ok. On heparin.  Hgb this morning 4.2 but VAD parameters unchanged and Maps ok.  Receiving  2UPRBCs. Repeat CBC after blood.   On tube feeds today.    Length of Stay: [place] NP-C  09/11/2015, 8:07 AM  VAD Team --- VAD ISSUES ONLY--- Pager [phone] (7am - 7am)  Advanced Heart Failure Team  Pager [phone] (M-F; 7a - 4p)  Please contact [facility] for night-coverage after hours (4p -7a ) and weekends on [email or web address]  Patient seen and examined with Darrick Grinder, NP. We discussed all aspects of the encounter. I agree with the assessment and plan as stated above.   Hgb  way down today on CBC and co-ox. May be artifactual. Had bleeding from arterial line. Now being transfused. Will check CT chest and abdomen. Hold  heparin. Repeat CBC.   Making 15-20cc urine per hour. Hemodynamics and co-ox are stable.   Continue antibiotics and wound care. Back on TFs.   Potassium also low. We are repeating BMET.   VAD parameters stable.   Bathsheba Durrett,MD 8:48 AM

## 2015-09-11 NOTE — Progress Notes (Signed)
Patient ID: [name], male   DOB: 10-01-1970, 45 y.o.   MRN: [id number]  Plastic Surgery Procedure Note:   Patient up walking with PT prior to dressing change. Reports no nausea today, but stomach feels bloated. Passing some flatus, no BM.   Dressing change to ABD wound done under sterile technique with scant bloody drainage on packing noted.   The Abra device was adjusted and the wound width now only 1-1.5 cm across. 4x4 gauze with small amount of surgical lubricant placed over Sorbact and covered with 4x4 and ABD pad.  Patient tolerated the procedure well.   RAYBURN,SHAWN,PA-C for Dr. Marla Roe Pl[facility] Surgery [phone]

## 2015-09-11 NOTE — Progress Notes (Signed)
Dr. Prescott Gum notified about pt's critical Hgb 4.2. Verbal order to transfuse two units PRBCs and check CBC after transfusion. Will implement and continue to monitor pt closely.

## 2015-09-11 NOTE — Progress Notes (Signed)
Patient ID: [name], male   DOB: [birth year], 45 y.o.   MRN: [id number]  [facility] Progress Note   Assessment/ Plan:   1. AKI Suspected to be ATN from sepsis/shock. Remains oliguric and with plans for hemodialysis later today for clearance/volume unloading. Labs from this morning appear to be vastly discrepant from yesterday's-repeat labs pending to decide on HD bath. Mild edema on physical exam, no uremic signs or symptoms. 2. LVAD MSSA driveline infection/infected VAD pocket: On intravenous antibiotic therapy and now status post debridement and initial step towards wound closure. Now on Rifampin. 3. Clostridium difficile colitis: On oral vancomycin, reports significant clinical improvement-no further diarrhea. 4. Hypokalemia: Status post replacement-hyperkalemic yesterday but surprisingly hypokalemic again this morning-await recheck of labs. 5. Nonischemic cardiomyopathy/chronic systolic heart failure: supported with LVAD.  Subjective:   Getting blood transfusion this morning as labs showed acute drop of hemoglobin from 8 to 4-no overt blood loss except for recent arterial line removal site. Abdomen appears to be a little dense and awaiting CT scan for intra-abdominal/retroperitoneal bleed.    Objective:   BP 119/80 mmHg  Pulse 90  Temp(Src) 97.4 F (36.3 C) (Oral)  Resp 15  Ht 5\' 7"  (1.702 m)  Wt 87.3 kg (192 lb 7.4 oz)  BMI 30.14 kg/m2  SpO2 97%  Intake/Output Summary (Last 24 hours) at 09/11/15 0845 Last data filed at 09/11/15 0800  Gross per 24 hour  Intake 1056.95 ml  Output    275 ml  Net 781.95 ml   Weight change: -2.3 kg (-5 lb 1.1 oz)  Physical Exam: [id number] resting in bed CVS: Pulse regular rhythm, normal rate Resp: Poor inspiratory effort-decreased breath sounds bilaterally Abd: Slightly tender, distended and firm Ext: Trace- 1+ lower extremity edema  Imaging: Dg Chest Port 1 View  09/11/2015  CLINICAL DATA:  LEFT ventricular  assist device EXAM: PORTABLE CHEST 1 VIEW COMPARISON:  Portable exam 0623 hours compared to 09/09/2015 FINDINGS: RIGHT subclavian central venous catheter tip projecting over cavoatrial junction. Feeding tube extends into stomach. LEFT ventricular assist device identified at the inferior margin of the exam. LEFT subclavian AICD with lead projecting over RIGHT ventricle. Enlargement of cardiac silhouette with pulmonary vascular congestion. Diffuse pulmonary infiltrates likely pulmonary edema. No pneumothorax. IMPRESSION: No interval change in pulmonary edema. Electronically Signed   By: Lavonia Dana M.D.   On: 09/11/2015 07:55   Dg Chest Port 1 View  09/09/2015  CLINICAL DATA:  Left ventricular assist device EXAM: PORTABLE CHEST 1 VIEW COMPARISON:  09/07/2015 FINDINGS: Left ventricular assist device unchanged in position. AICD unchanged in position. Left atrial appendage clip unchanged. Right subclavian central venous catheter tip at the cavoatrial junction. Feeding tube in place with the tip not visualized. Mild improvement in diffuse bilateral airspace disease most compatible with resolving edema. Improvement in bibasilar atelectasis. No effusion. IMPRESSION: Left ventricular assist device unchanged. Improvement in pulmonary edema. Electronically Signed   By: Franchot Gallo M.D.   On: 09/09/2015 11:54   Dg Abd Portable 1v  09/11/2015  CLINICAL DATA:  Portable abdominal radiograph, emesis today. EXAM: PORTABLE ABDOMEN - 1 VIEW COMPARISON:  Abdominal radiograph of Sep 10, 2015 FINDINGS: There is mild gaseous distention of the stomach. The feeding tube tip appears to lie in the region of the duodenal bulb. The small and large bowel gas pattern is within the limits of normal. The left ventricular assist device where visualized is appropriately positioned. IMPRESSION: Increased volume of gas within the stomach. The small and large  bowel gas pattern remains within the limits of normal. The feeding tube tip lies in  the region of the duodenal bulb. Electronically Signed   By: David  Martinique M.D.   On: 09/11/2015 07:53   Dg Abd Portable 1v  09/10/2015  CLINICAL DATA:  Feeding tube placement EXAM: PORTABLE ABDOMEN - 1 VIEW COMPARISON:  Portable exam 1528 hours compared to 09/10/2015 at 1345 hours FINDINGS: Feeding tube traverses stomach with tip projecting over either the duodenal bulb or proximal descending duodenum. LVAD projects over upper abdomen. AICD lead projects over RIGHT ventricle. Visualized bowel gas pattern normal. IMPRESSION: Tip of feeding tube projects over either the duodenal bulb or proximal descending duodenum. Electronically Signed   By: Lavonia Dana M.D.   On: 09/10/2015 15:44   Dg Abd Portable 1v  09/10/2015  CLINICAL DATA:  Abdominal distention and emesis. EXAM: PORTABLE ABDOMEN - 1 VIEW COMPARISON:  One-view abdomen 09/04/2015. FINDINGS: A left ventricular assist device is in place. The bowel gas pattern is normal. Previously noted gastric distention has resolved. There is no obstruction or free air. IMPRESSION: Normal bowel gas pattern. Electronically Signed   By: San Morelle M.D.   On: 09/10/2015 13:58    Labs: BMET  Recent Labs Lab 09/05/15 0450  09/06/15 0415 09/07/15 0400 09/08/15 1201 09/09/15 1545 09/10/15 0155 09/10/15 1025 09/10/15 1250 09/10/15 1526 09/11/15 0515  NA 134*  < > 138 139 135 139 136  --  135 136 141  K 3.7  < > 3.3* 3.3* 3.0* 3.4* 3.0* 3.3* 3.4* 5.5* <2.0*  CL 103  < > 102 103 102 100* 100*  --  104 102 119*  CO2 16*  --  21* 20* 22  --  26  --  25  --  16*  GLUCOSE 99  < > 93 82 105* 82 101*  --  81 72 84  BUN 36*  < > 29* 44* 53* 26* 32*  --  39* 37* 31*  CREATININE 7.13*  < > 5.90* 7.49* 7.03* 3.90* 5.54*  --  5.71* 6.10* 4.02*  CALCIUM 7.8*  --  8.1* 8.4* 8.5*  --  8.1*  --  7.8*  --  4.9*  PHOS  --   --   --   --  5.8*  --   --   --   --   --   --   < > = values in this interval not displayed. CBC  Recent Labs Lab 09/09/15 1158   09/10/15 0155 09/10/15 1250 09/10/15 1526 09/11/15 0515  WBC 14.9*  --  12.8* 13.5*  --  9.7  HGB 9.2*  < > 8.8* 8.3* 8.5* 4.2*  HCT 27.4*  < > 25.3* 24.0* 25.0* 12.3*  MCV 78.3  --  76.2* 77.2*  --  76.4*  PLT 160  --  181 208  --  94*  < > = values in this interval not displayed.  Medications:    . sodium chloride   Intravenous Once  . allopurinol  300 mg Oral Daily  . amiodarone  100 mg Oral Daily  .  ceFAZolin (ANCEF) IV  1 g Intravenous Q24H  . citalopram  20 mg Oral Daily  . docusate sodium  200 mg Oral Daily  . feeding supplement (NEPRO CARB STEADY)  1,000 mL Per Tube Q24H  . feeding supplement (NEPRO CARB STEADY)  237 mL Per Tube TID WC  . feeding supplement (PRO-STAT SUGAR FREE 64)  30 mL Per Tube TID  .  ferrous sulfate  325 mg Oral BID WC  . guaiFENesin  600 mg Oral BID  . magic mouthwash  10 mL Oral TID  . metoCLOPramide (REGLAN) injection  5 mg Intravenous Q6H  . pantoprazole (PROTONIX) IV  40 mg Intravenous Q24H  . pregabalin  75 mg Oral BID  . rifampin  300 mg Oral Q8H  . sildenafil  20 mg Oral Q8H  . simethicone  80 mg Oral QID  . sodium chloride flush  10-40 mL Intracatheter Q12H  . sodium chloride flush  3 mL Intravenous Q12H  . tamsulosin  0.4 mg Oral Daily  . vancomycin  125 mg Oral Q8H   Elmarie Shiley, MD 09/11/2015, 8:45 AM

## 2015-09-11 NOTE — Care Management Note (Addendum)
Case Management Note  Patient Details  Name: [name] MRN: [id number] Date of Birth: 05-16-1970  Subjective/Objective:   Pt admitted with driveline infection from LVAD implanted in 9/16                Action/Plan:  PTA pt was independent from home alone , previously utilized [facility] and IV Milrinone - however services were no longer active prior to this admit.  Plan is for pt to return to OR 09/02/15.  CM will continue to monitor for disposition needs   Expected Discharge Date:                  Expected Discharge Plan:  IP Rehab Facility  In-House Referral:  Clinical Social Work  Discharge planning Services  CM Consult  Post Acute Care Choice:    Choice offered to:     DME Arranged:    DME Agency:     HH Arranged:    [place] Agency:     Status of Service:  In process, will continue to follow  Medicare Important Message Given:    Date Medicare IM Given:    Medicare IM give by:    Date Additional Medicare IM Given:    Additional Medicare Important Message give by:     If discussed at [place] of Stay Meetings, dates discussed:    Additional Comments:   09/11/2015  CM submitted benefit check for Revatio REVATIO 20 MG Q8   COVER- NO  PRIOR APPROVAL - YES # [phone]   GENERIC:  SILDENAFIO 20 MG Q 8   COVER- YES  CO-PAY- $ 7.00  TIER- 1 DRUG  PRIOR APPROVAL NO  PHARMACY : [facility] # [id number] FAX # [phone]  [place] [facility]  * MUST SAY SPECIALTY PHARMACY   MAIL - ORDER 90 DAY SUPPLY $ 14.00   09/10/15 CIR following - CSW consulted as back up plan  09/05/15 Pt remains on pressor.  Pt taken back to OR today for wound debridement.   Maryclare Labrador, RN 09/11/2015, 10:39 AM

## 2015-09-11 NOTE — Progress Notes (Signed)
ANTICOAGULATION CONSULT NOTE - Follow Up Consult  Pharmacy Consult for Heparin Indication: LVAD  Allergies  Allergen Reactions  . Phytonadione Other (See Comments)    Patient has LVAD: please check with LVAD coordinator on call or LVAD MD on call before reversal of anticoagulation with vit k    Patient Measurements: Height: 5\' 7"  (170.2 cm) Weight: 191 lb 2.2 oz (86.7 kg) IBW/kg (Calculated) : 66.1 Heparin Dosing Weight:  85 kg  Vital Signs: Temp: 98.6 F (37 C) (05/23 2355) Temp Source: Oral (05/23 2355) BP: 106/80 mmHg (05/24 0000) Pulse Rate: 81 (05/24 0000)  Labs:  Recent Labs  09/09/15 0400  09/09/15 1158  09/10/15 0155 09/10/15 1250 09/10/15 1526 09/11/15 0020  HGB  --   < > 9.2*  < > 8.8* 8.3* 8.5*  --   HCT  --   < > 27.4*  < > 25.3* 24.0* 25.0*  --   PLT  --   < > 160  --  181 208  --   --   HEPARINUNFRC <0.10*  --   --   --  <0.10*  --   --  <0.10*  CREATININE  --   --   --   < > 5.54* 5.71* 6.10*  --   < > = values in this interval not displayed.  Estimated Creatinine Clearance: 15.7 mL/min (by C-G formula based on Cr of 6.1).  Assessment: 6 yom on Coumadin pta for his LVAD, admitted with driveline abscess, sternal wound infection, and MSSA bacteremia. Home Coumadin held, and heparin bridge started 5/10 pending OR trips for wound debridement.   Heparin remains < 0.1 and bleeding continues to be an issue so MD has asked that rate not be increased. Will need to discuss rate adjustment and recommendations with the MD.   Discussed with Dr. Prescott Gum, will slowly increase heparin to 1100 units/hr and monitor bleeding.  Won't make any further adjustments without discussion with Dr. Prescott Gum.   Per RN had bleeding earlier today from site where Aline was removed, but now has resolved.  Repeat HL remains undetectable after increasing to 1100 units/hr. No further issues with bleeding noted. Will follow-up with Dr. Prescott Gum in the morning.  Goal of Therapy:   Heparin level 0.3-0.4  Monitor platelets by anticoagulation protocol: Yes   Plan:  Continue heparin 1100 units/hr and monitor for bleeding Further up-titration of heparin to be discussed with Dr. Prescott Gum 5/24. Daily heparin level and CBC. F/u plans to resume Coumadin eventually.  Andrey Cota. Diona Foley, PharmD, [place] Clinical Pharmacist Pager [phone] 09/11/2015 1:02 AM

## 2015-09-11 NOTE — Progress Notes (Addendum)
Nutrition Follow Up  DOCUMENTATION CODES:   Not applicable  INTERVENTION:    Continue Nepro formula at goal rate of 45 ml/hr with Prostat liquid protein 30 ml TID via tube  Total TF regimen to provide 2244 kcals, 132 gm protein, 785 ml of free water daily  NUTRITION DIAGNOSIS:   Inadequate oral intake related to poor appetite as evidenced by  (RN report), ongoing  GOAL:   Patient will meet greater than or equal to 90% of their needs, met  MONITOR:   TF tolerance, PO intake, Labs, Weight trends, I & O's  ASSESSMENT:   45 yo Male admitted for treatment of a sternal / chest wound. The patient is currently in the Unit and recovering from surgery earlier this week. He had an LVAD placed. He had a trip to [place] and came back with concerns of infection at the site of the pump. He was taken to the OR for debridement. There was purulence at the site. Irrigation and debridement was done. Acell and the VAC was placed. He is recovering now. Attempts are being made to see if he is a candidate for transplant. Will need coverage over the pump. Pictures seen and significant amount exposed.  Patient s/p procedures 5/22: DEBRIDEMENT AND CLOSURE WOUND WITH PLACEMENT OF ABRA CLOSURE DEVICE  Diet advanced to Clear Liquids 5/22 >> refusing to eat. New CORTRAK feeding tube placed yesterday (tip in duodenal bulb) >> previous tube had clogging issue and was removed. Nepro formula currently infusing at goal rate of 45 ml/hr; also receiving Prostat liquid protein TID. Nephrology following for HD. ABRA and Acell in place.  Diet Order:  Diet clear liquid Room service appropriate?: Yes; Fluid consistency:: Thin  Skin:  Wound (see comment) (chest wound)  Last BM:  5/23  Height:   Ht Readings from Last 1 Encounters:  08/26/15 '5\' 7"'$  (1.702 m)    Weight:   Wt Readings from Last 1 Encounters:  09/11/15 192 lb 7.4 oz (87.3 kg)    Ideal Body Weight:  67.2 kg  BMI:  Body mass index is  30.14 kg/(m^2).  Estimated Nutritional Needs:   Kcal:  2100-2300  Protein:  120-130 gm  Fluid:  per MD  EDUCATION NEEDS:   No education needs identified at this time  Arthur Holms, RD, LDN Pager #: [phone] After-Hours Pager #: [phone]

## 2015-09-11 NOTE — Care Management (Signed)
[employer] Benefit Check REVATIO 20 MG Q8   COVER- NO  PRIOR APPROVAL - YES # [id number]   GENERIC:  SILDENAFIO 20 MG Q 8   COVER- YES  CO-PAY- $ 7.00  TIER- 1 DRUG  PRIOR APPROVAL NO  PHARMACY : [facility] # [id number] FAX # [phone]  [facility]  * MUST SAY SPECIALTY PHARMACY   MAIL - ORDER 90 DAY SUPPLY $ 14.00

## 2015-09-12 DIAGNOSIS — R5381 Other malaise: Secondary | ICD-10-CM

## 2015-09-12 LAB — COMPREHENSIVE METABOLIC PANEL
ALT: 25 U/L (ref 17–63)
AST: 78 U/L — ABNORMAL HIGH (ref 15–41)
Albumin: 1.6 g/dL — ABNORMAL LOW (ref 3.5–5.0)
Alkaline Phosphatase: 168 U/L — ABNORMAL HIGH (ref 38–126)
Anion gap: 9 (ref 5–15)
BUN: 18 mg/dL (ref 6–20)
CO2: 27 mmol/L (ref 22–32)
Calcium: 7.9 mg/dL — ABNORMAL LOW (ref 8.9–10.3)
Chloride: 99 mmol/L — ABNORMAL LOW (ref 101–111)
Creatinine, Ser: 3.5 mg/dL — ABNORMAL HIGH (ref 0.61–1.24)
GFR calc Af Amer: 22 mL/min — ABNORMAL LOW (ref >60)
GFR calc non Af Amer: 19 mL/min — ABNORMAL LOW (ref >60)
Glucose, Bld: 92 mg/dL (ref 65–99)
Potassium: 3.2 mmol/L — ABNORMAL LOW (ref 3.5–5.1)
Sodium: 135 mmol/L (ref 135–145)
Total Bilirubin: 8.8 mg/dL — ABNORMAL HIGH (ref 0.3–1.2)
Total Protein: 6.2 g/dL — ABNORMAL LOW (ref 6.5–8.1)

## 2015-09-12 LAB — GLUCOSE, CAPILLARY
GLUCOSE-CAPILLARY: 100 mg/dL — AB (ref 65–99)
GLUCOSE-CAPILLARY: 73 mg/dL (ref 65–99)
GLUCOSE-CAPILLARY: 94 mg/dL (ref 65–99)
Glucose-Capillary: 86 mg/dL (ref 65–99)
Glucose-Capillary: 75 mg/dL (ref 65–99)
Glucose-Capillary: 74 mg/dL (ref 65–99)

## 2015-09-12 LAB — TYPE AND SCREEN
ABO/RH(D): O POS
Antibody Screen: NEGATIVE
Unit division: 0
Unit division: 0
Unit division: 0
Unit division: 0

## 2015-09-12 LAB — CBC
HCT: 28.8 % — ABNORMAL LOW (ref 39.0–52.0)
HCT: 28 % — ABNORMAL LOW (ref 39.0–52.0)
Hemoglobin: 9.9 g/dL — ABNORMAL LOW (ref 13.0–17.0)
Hemoglobin: 9.4 g/dL — ABNORMAL LOW (ref 13.0–17.0)
MCH: 27 pg (ref 26.0–34.0)
MCH: 26.5 pg (ref 26.0–34.0)
MCHC: 34.4 g/dL (ref 30.0–36.0)
MCHC: 33.6 g/dL (ref 30.0–36.0)
MCV: 78.7 fL (ref 78.0–100.0)
MCV: 78.9 fL (ref 78.0–100.0)
Platelets: 182 10*3/uL (ref 150–400)
Platelets: 178 10*3/uL (ref 150–400)
RBC: 3.66 MIL/uL — ABNORMAL LOW (ref 4.22–5.81)
RBC: 3.55 MIL/uL — ABNORMAL LOW (ref 4.22–5.81)
RDW: 20.5 % — ABNORMAL HIGH (ref 11.5–15.5)
RDW: 20.4 % — ABNORMAL HIGH (ref 11.5–15.5)
WBC: 16.5 10*3/uL — ABNORMAL HIGH (ref 4.0–10.5)
WBC: 17.1 10*3/uL — ABNORMAL HIGH (ref 4.0–10.5)

## 2015-09-12 LAB — CARBOXYHEMOGLOBIN
CARBOXYHEMOGLOBIN: 1.9 % — AB (ref 0.5–1.5)
METHEMOGLOBIN: 1.5 % (ref 0.0–1.5)
O2 SAT: 66.1 %
Total hemoglobin: 10.8 g/dL — ABNORMAL LOW (ref 13.5–18.0)

## 2015-09-12 LAB — HEPARIN INDUCED PLATELET AB (HIT ANTIBODY): Heparin Induced Plt Ab: 0.178 OD (ref 0.000–0.400)

## 2015-09-12 LAB — LACTATE DEHYDROGENASE: LDH: 294 U/L — ABNORMAL HIGH (ref 98–192)

## 2015-09-12 LAB — HEPARIN LEVEL (UNFRACTIONATED): Heparin Unfractionated: <0.1 IU/mL — ABNORMAL LOW (ref 0.30–0.70)

## 2015-09-12 MED ORDER — PANTOPRAZOLE SODIUM 40 MG IV SOLR
40.0000 mg | Freq: Two times a day (BID) | INTRAVENOUS | Status: DC
  Administered 2015-09-12 – 2015-09-17 (×12): 40 mg via INTRAVENOUS
  Filled 2015-09-12 (×12): qty 40

## 2015-09-12 MED ORDER — PROMETHAZINE HCL 25 MG/ML IJ SOLN
12.5000 mg | Freq: Once | INTRAMUSCULAR | Status: AC
  Administered 2015-09-12: 12.5 mg via INTRAVENOUS
  Filled 2015-09-12: qty 1

## 2015-09-12 MED ORDER — HEPARIN (PORCINE) IN NACL 100-0.45 UNIT/ML-% IJ SOLN
1000.0000 [IU]/h | INTRAMUSCULAR | Status: DC
  Administered 2015-09-12 – 2015-09-16 (×5): 1000 [IU]/h via INTRAVENOUS
  Filled 2015-09-12 (×6): qty 250

## 2015-09-12 MED ORDER — POTASSIUM CHLORIDE 10 MEQ/50ML IV SOLN
10.0000 meq | INTRAVENOUS | Status: AC
  Administered 2015-09-12 (×4): 10 meq via INTRAVENOUS
  Filled 2015-09-12: qty 50

## 2015-09-12 MED ORDER — HEPARIN (PORCINE) IN NACL 100-0.45 UNIT/ML-% IJ SOLN
1000.0000 [IU]/h | INTRAMUSCULAR | Status: DC
Start: 2015-09-12 — End: 2015-09-12

## 2015-09-12 MED ORDER — POTASSIUM CHLORIDE CRYS ER 20 MEQ PO TBCR
40.0000 meq | EXTENDED_RELEASE_TABLET | Freq: Once | ORAL | Status: AC
  Administered 2015-09-12: 40 meq via ORAL
  Filled 2015-09-12: qty 2

## 2015-09-12 NOTE — Progress Notes (Signed)
Pt received dialysis at bedside from 9:15 pm - 1:15 am. Vital signs and VAD parameters remained stable. Will continue to monitor.

## 2015-09-12 NOTE — Progress Notes (Signed)
HeartMate 2 Rounding Note  Subjective:   Status post HeartMate 2 implant mid 2016 for non-ischemic cardiomyopathy  Infected pump pocket with MSSA bacteremia May 2017 Dressing changes, debridement and wound VAC therapy has been applied previously this admission with minimal wound healing  22 May  patient was taken to or for external wound closure device-ABRA which will bring skin and subcutaneous tissue together over several days. A - cell powder also applied  22 May  Hope to close the abdominal wound with skin sutures in about 1 week.  Patient with systemic sequela of MSSA sepsis, on renal dialysis and  low dose norepinephrine - now weaned off  No neurologic deficit Ileus improved  with pyloric tube feed in place - Cortrak Continuing blood loss from heparin and bleeding from open wound has improved -  holding coumadin until wound is closed in case he needs a muscle flap  Hemoglobin remains stable overnight with heparin off More A-cell powder was applied to the wound today by plastic surgery Patient had another episode of emesis with some dark bloody material this a.m. CBC shows no change in hemoglobin. Tube feeds have been held for 4 hours. Heparin will be resumed later this p.m. Rate 1000 units per hour   LVAD INTERROGATION:  HeartMate II LVAD:  Flow 4.8 liters/min, speed 9200, power 4.9, PI 6.1  Controller in place   Objective:    Vital Signs:   Temp:  [97.9 F (36.6 C)-98.5 F (36.9 C)] 98.1 F (36.7 C) (05/25 0400) Pulse Rate:  [79-88] 81 (05/25 1400) Resp:  [14-23] 17 (05/25 1400) BP: (86-135)/(69-95) 86/78 mmHg (05/25 1400) SpO2:  [98 %-100 %] 99 % (05/25 1400) Weight:  [182 lb 15.7 oz (83 kg)] 182 lb 15.7 oz (83 kg) (05/25 0500) Last BM Date: 09/11/15 Mean arterial Pressure 80-90 mmHg  Intake/Output:   Intake/Output Summary (Last 24 hours) at 09/12/15 1552 Last data filed at 09/12/15 1000  Gross per 24 hour  Intake   1200 ml  Output   2675 ml  Net  -1475 ml      Physical Exam: General:  Well appearing. No resp difficulty HEENT: normal Neck: supple. JVP . Carotids ; no bruits. No lymphadenopathy or thryomegaly appreciated. Cor: Mechanical heart sounds with LVAD hum present. Lungs: clear Abdomen: soft, nontender, nondistended. No hepatosplenomegaly. No bruits or masses. Good bowel sounds.Abdominal dressing intact Extremities: no cyanosis, clubbing, rash, edema Neuro: alert & orientedx3, cranial nerves grossly intact. moves all 4 extremities w/o difficulty. Affect pleasant  Telemetry: Sinus rhythm  Labs: Basic Metabolic Panel:  Recent Labs Lab 09/08/15 1201  09/10/15 0155  09/10/15 1250 09/10/15 1526 09/11/15 0515 09/11/15 0820 09/12/15 0245  NA 135  < > 136  --  135 136 141 136 135  K 3.0*  < > 3.0*  < > 3.4* 5.5* <2.0* 3.2* 3.2*  CL 102  < > 100*  --  104 102 119* 103 99*  CO2 22  --  26  --  25  --  16* 22 27  GLUCOSE 105*  < > 101*  --  81 72 84 88 92  BUN 53*  < > 32*  --  39* 37* 31* 50* 18  CREATININE 7.03*  < > 5.54*  --  5.71* 6.10* 4.02* 6.76* 3.50*  CALCIUM 8.5*  --  8.1*  --  7.8*  --  4.9* 8.1* 7.9*  MG  --   --  2.3  --   --   --   --  2.7*  --   PHOS 5.8*  --   --   --   --   --   --   --   --   < > = values in this interval not displayed.  Liver Function Tests:  Recent Labs Lab 09/06/15 0415 09/07/15 0400 09/08/15 1201 09/10/15 0155 09/11/15 0515 09/12/15 0245  AST 46* 42*  --  61* 33 78*  ALT 17 8*  --  15* 13* 25  ALKPHOS 109 92  --  112 78 168*  BILITOT 9.1* 8.5*  --  8.6* 5.3* 8.8*  PROT 5.8* 5.7*  --  5.7* 3.2* 6.2*  ALBUMIN 1.7* 1.5* 1.4* 1.5* <1.0* 1.6*   No results for input(s): LIPASE, AMYLASE in the last 168 hours. No results for input(s): AMMONIA in the last 168 hours.  CBC:  Recent Labs Lab 09/10/15 1250 09/10/15 1526 09/11/15 0515 09/11/15 1300 09/12/15 0245 09/12/15 0930  WBC 13.5*  --  9.7 17.5* 16.5* 17.1*  HGB 8.3* 8.5* 4.2* 9.7* 9.9* 9.4*  HCT 24.0* 25.0* 12.3* 28.7*  28.8* 28.0*  MCV 77.2*  --  76.4* 79.1 78.7 78.9  PLT 208  --  94* 153 182 178    INR: No results for input(s): INR in the last 168 hours.  Other results:  EKG:   Imaging: Ct Abdomen Wo Contrast  09/11/2015  CLINICAL DATA:  Anemia, abdominal distention. EXAM: CT CHEST, ABDOMEN AND PELVIS WITHOUT CONTRAST TECHNIQUE: Multidetector CT imaging of the chest, abdomen and pelvis was performed following the standard protocol without IV contrast. COMPARISON:  CT scan of November 30, 2014. FINDINGS: CT CHEST No pneumothorax is noted. Mild bilateral pleural effusions are noted with adjacent subsegmental atelectasis. Multifocal densities are noted in both upper lobes concerning for edema or possibly inflammation. No significant osseous abnormality is noted in the chest. Mild cardiomegaly is noted. Patient is status post left ventricular assistance device placement. Surgical wound in the epigastric region is noted containing debris which may represent inflammatory material. There does not appear to be a significant fluid collection in any deep location. Left subclavian pacemaker is noted. Mildly enlarged adenopathy is noted in the aortopulmonary window which is not significantly changed compared to prior exam and most likely reactive in etiology. Feeding tube is seen passing through the esophagus and stomach into the proximal duodenum. CT ABDOMEN AND PELVIS Status post cholecystectomy. No focal abnormality is noted in the liver, spleen or pancreas on these unenhanced images. Adrenal glands and kidneys appear normal. No hydronephrosis or renal obstruction is noted mild amount of free fluid is seen around the inferior portion of the liver. There is no evidence of bowel obstruction. The appendix appears normal. Urinary bladder is decompressed secondary to Foley catheter. Moderate amount of free fluid is noted in the pelvis with Hounsfield measurement of 11 consistent with fluid density. Mild anasarca is noted.  Diverticulosis is noted throughout the colon without inflammation. Mildly enlarged bilateral inguinal lymph nodes are noted which most likely are inflammatory in etiology. IMPRESSION: Mild bilateral pleural effusions are noted with adjacent subsegmental atelectasis. Status post left ventricular assistance device placement. Open surgical wound in the epigastric region contains debris which may represent inflammatory material. Clinical correlation is recommended. No deep abscess or fluid collection is noted. Diverticulosis is noted throughout the colon without inflammation. Mild anasarca is noted. Mild amount of free fluid is noted around the liver, with moderate amount of free fluid seen in the pelvis. Mildly enlarged bilateral inguinal adenopathy is noted  most likely inflammatory in etiology. Electronically Signed   By: Marijo Conception, M.D.   On: 09/11/2015 10:10   Ct Chest Wo Contrast  09/11/2015  CLINICAL DATA:  Anemia, abdominal distention. EXAM: CT CHEST, ABDOMEN AND PELVIS WITHOUT CONTRAST TECHNIQUE: Multidetector CT imaging of the chest, abdomen and pelvis was performed following the standard protocol without IV contrast. COMPARISON:  CT scan of November 30, 2014. FINDINGS: CT CHEST No pneumothorax is noted. Mild bilateral pleural effusions are noted with adjacent subsegmental atelectasis. Multifocal densities are noted in both upper lobes concerning for edema or possibly inflammation. No significant osseous abnormality is noted in the chest. Mild cardiomegaly is noted. Patient is status post left ventricular assistance device placement. Surgical wound in the epigastric region is noted containing debris which may represent inflammatory material. There does not appear to be a significant fluid collection in any deep location. Left subclavian pacemaker is noted. Mildly enlarged adenopathy is noted in the aortopulmonary window which is not significantly changed compared to prior exam and most likely reactive in  etiology. Feeding tube is seen passing through the esophagus and stomach into the proximal duodenum. CT ABDOMEN AND PELVIS Status post cholecystectomy. No focal abnormality is noted in the liver, spleen or pancreas on these unenhanced images. Adrenal glands and kidneys appear normal. No hydronephrosis or renal obstruction is noted mild amount of free fluid is seen around the inferior portion of the liver. There is no evidence of bowel obstruction. The appendix appears normal. Urinary bladder is decompressed secondary to Foley catheter. Moderate amount of free fluid is noted in the pelvis with Hounsfield measurement of 11 consistent with fluid density. Mild anasarca is noted. Diverticulosis is noted throughout the colon without inflammation. Mildly enlarged bilateral inguinal lymph nodes are noted which most likely are inflammatory in etiology. IMPRESSION: Mild bilateral pleural effusions are noted with adjacent subsegmental atelectasis. Status post left ventricular assistance device placement. Open surgical wound in the epigastric region contains debris which may represent inflammatory material. Clinical correlation is recommended. No deep abscess or fluid collection is noted. Diverticulosis is noted throughout the colon without inflammation. Mild anasarca is noted. Mild amount of free fluid is noted around the liver, with moderate amount of free fluid seen in the pelvis. Mildly enlarged bilateral inguinal adenopathy is noted most likely inflammatory in etiology. Electronically Signed   By: Marijo Conception, M.D.   On: 09/11/2015 10:10   Dg Chest Port 1 View  09/11/2015  CLINICAL DATA:  LEFT ventricular assist device EXAM: PORTABLE CHEST 1 VIEW COMPARISON:  Portable exam 0623 hours compared to 09/09/2015 FINDINGS: RIGHT subclavian central venous catheter tip projecting over cavoatrial junction. Feeding tube extends into stomach. LEFT ventricular assist device identified at the inferior margin of the exam. LEFT  subclavian AICD with lead projecting over RIGHT ventricle. Enlargement of cardiac silhouette with pulmonary vascular congestion. Diffuse pulmonary infiltrates likely pulmonary edema. No pneumothorax. IMPRESSION: No interval change in pulmonary edema. Electronically Signed   By: Lavonia Dana M.D.   On: 09/11/2015 07:55   Dg Abd Portable 1v  09/11/2015  CLINICAL DATA:  Portable abdominal radiograph, emesis today. EXAM: PORTABLE ABDOMEN - 1 VIEW COMPARISON:  Abdominal radiograph of Sep 10, 2015 FINDINGS: There is mild gaseous distention of the stomach. The feeding tube tip appears to lie in the region of the duodenal bulb. The small and large bowel gas pattern is within the limits of normal. The left ventricular assist device where visualized is appropriately positioned. IMPRESSION: Increased volume of  gas within the stomach. The small and large bowel gas pattern remains within the limits of normal. The feeding tube tip lies in the region of the duodenal bulb. Electronically Signed   By: David  Martinique M.D.   On: 09/11/2015 07:53     Medications:     Scheduled Medications: . sodium chloride   Intravenous Once  . allopurinol  100 mg Oral Daily  . amiodarone  100 mg Oral Daily  .  ceFAZolin (ANCEF) IV  1 g Intravenous Q24H  . citalopram  20 mg Oral Daily  . docusate sodium  200 mg Oral Daily  . feeding supplement (PRO-STAT SUGAR FREE 64)  30 mL Per Tube TID  . ferrous sulfate  325 mg Oral BID WC  . guaiFENesin  600 mg Oral BID  . magic mouthwash  10 mL Oral TID  . metoCLOPramide (REGLAN) injection  5 mg Intravenous Q6H  . pantoprazole (PROTONIX) IV  40 mg Intravenous Q12H  . potassium chloride  10 mEq Intravenous Q1 Hr x 4  . pregabalin  75 mg Oral Daily  . rifampin  300 mg Oral Q8H  . sildenafil  20 mg Oral Q8H  . simethicone  80 mg Oral QID  . sodium chloride flush  10-40 mL Intracatheter Q12H  . sodium chloride flush  3 mL Intravenous Q12H  . tamsulosin  0.4 mg Oral Daily  . vancomycin   125 mg Oral Q8H    Infusions: . feeding supplement (NEPRO CARB STEADY) Stopped (09/12/15 0900)  . heparin 1,000 Units/hr (09/12/15 1518)    PRN Medications: sodium chloride, sodium chloride, sodium chloride, alteplase, bisacodyl **OR** bisacodyl, fentaNYL (SUBLIMAZE) injection, gelatin adsorbable, Gerhardt's butt cream, ondansetron, oxyCODONE, sodium chloride flush, sodium chloride flush, traMADol   Assessment:  MSSA infection of VAD pocket treated with IV antibiotics as well as topical wound therapies Planning for delayed closure of wound with external soft tissue device- ABRA  May30 Acute renal failure on hemodialysis Acute blood loss anemia from open wound and IV heparin for VAD- now on hold with sig drop in Hb   Plan/Discussion:   Hold heparin and monitor for bleeding- LDH remaining low Optimize nutrition, mobilize out of bed to chair, incentive spirometry Hold Coumadin until final wound closure next week Resume IV heparin later this afternoon-evidence of upper GI bleed. Patient now on IV Protonix  I reviewed the LVAD parameters from today, and compared the results to the patient's prior recorded data.  No programming changes were made.  The LVAD is functioning within specified parameters.  The patient performs LVAD self-test daily.  LVAD interrogation was negative for any significant power changes, alarms or PI events/speed drops.  LVAD equipment check completed and is in good working order.  Back-up equipment present.   LVAD education done on emergency procedures and precautions and reviewed exit site care.  Length of Stay: [place] III 09/12/2015, 3:52 PM

## 2015-09-12 NOTE — Progress Notes (Signed)
Patient ID: [name], male   DOB: 07/19/1970, 45 y.o.   MRN: [id number]    HeartMate 2 Rounding Note  Subjective:    Admitted from [facility] in [place] with driveline abscess. Blood cultures --> MSSA and has C diff. Remains on Nafcillin  and oral vancomycin.   08/27/15 S/P I &D Epigastric with hardware exposed and VAC placement.  5/11 repeat I&D and wound vac replacement.  5/11 TEE no vegetations on valves or ICD wire 5/22 S/P ABRA and Acell   Yesterday he received 2UPRBCs. Hgb 9.9.  Tolerated HD last night. Had BM last night. Ambulated 140 feet with PT.   Denies SOB. Fatigued.    VAD INTERROGATION:  HeartMate II LVAD:  Flow 4.6 liters/min, speed 9200, power 5.2   PI 7.6   PI events.   Objective:    Vital Signs:   Temp:  [97.4 F (36.3 C)-98.5 F (36.9 C)] 98.1 F (36.7 C) (05/25 0400) Pulse Rate:  [78-90] 85 (05/25 0600) Resp:  [11-21] 20 (05/25 0600) BP: (99-135)/(78-103) 125/87 mmHg (05/25 0600) SpO2:  [97 %-100 %] 100 % (05/25 0600) Weight:  [182 lb 15.7 oz (83 kg)] 182 lb 15.7 oz (83 kg) (05/25 0500) Last BM Date: 09/11/15 Mean arterial Pressure  80-90s   Intake/Output:   Intake/Output Summary (Last 24 hours) at 09/12/15 0701 Last data filed at 09/12/15 0500  Gross per 24 hour  Intake   2443 ml  Output   2720 ml  Net   -277 ml     Physical Exam: CVP ~5.  General: In bed. NAD HEENT: Scleral icterus Neck: supple.  JVP 7-8 Carotids 2+ bilat; no bruits. No lymphadenopathy or thryomegaly appreciated. R subclavian trialysis cath dressing dry Cor: Mechanical heart sounds with LVAD hum present. Lungs: clear on  Abdomen: soft, tender, distended. + bowel sounds. Mid epigastric dressing ok.   Driveline: C/D/I; securement device intact and driveline incorporated Extremities: no cyanosis, clubbing, rash, 1+ edema R and LLE SCDs.  Neuro: A & O x3  Telemetry: NSR 80s   Labs: Basic Metabolic Panel:  Recent Labs Lab 09/08/15 1201   09/10/15 0155  09/10/15 1250 09/10/15 1526 09/11/15 0515 09/11/15 0820 09/12/15 0245  NA 135  < > 136  --  135 136 141 136 135  K 3.0*  < > 3.0*  < > 3.4* 5.5* <2.0* 3.2* 3.2*  CL 102  < > 100*  --  104 102 119* 103 99*  CO2 22  --  26  --  25  --  16* 22 27  GLUCOSE 105*  < > 101*  --  81 72 84 88 92  BUN 53*  < > 32*  --  39* 37* 31* 50* 18  CREATININE 7.03*  < > 5.54*  --  5.71* 6.10* 4.02* 6.76* 3.50*  CALCIUM 8.5*  --  8.1*  --  7.8*  --  4.9* 8.1* 7.9*  MG  --   --  2.3  --   --   --   --  2.7*  --   PHOS 5.8*  --   --   --   --   --   --   --   --   < > = values in this interval not displayed.  Liver Function Tests:  Recent Labs Lab 09/06/15 0415 09/07/15 0400 09/08/15 1201 09/10/15 0155 09/11/15 0515 09/12/15 0245  AST 46* 42*  --  61* 33 78*  ALT 17 8*  --  15* 13* 25  ALKPHOS 109 92  --  112 78 168*  BILITOT 9.1* 8.5*  --  8.6* 5.3* 8.8*  PROT 5.8* 5.7*  --  5.7* 3.2* 6.2*  ALBUMIN 1.7* 1.5* 1.4* 1.5* <1.0* 1.6*   No results for input(s): LIPASE, AMYLASE in the last 168 hours. No results for input(s): AMMONIA in the last 168 hours.  CBC:  Recent Labs Lab 09/10/15 0155 09/10/15 1250 09/10/15 1526 09/11/15 0515 09/11/15 1300 09/12/15 0245  WBC 12.8* 13.5*  --  9.7 17.5* 16.5*  HGB 8.8* 8.3* 8.5* 4.2* 9.7* 9.9*  HCT 25.3* 24.0* 25.0* 12.3* 28.7* 28.8*  MCV 76.2* 77.2*  --  76.4* 79.1 78.7  PLT 181 208  --  94* 153 182    INR: No results for input(s): INR in the last 168 hours.  Other results:    Imaging: Ct Abdomen Wo Contrast  09/11/2015  CLINICAL DATA:  Anemia, abdominal distention. EXAM: CT CHEST, ABDOMEN AND PELVIS WITHOUT CONTRAST TECHNIQUE: Multidetector CT imaging of the chest, abdomen and pelvis was performed following the standard protocol without IV contrast. COMPARISON:  CT scan of November 30, 2014. FINDINGS: CT CHEST No pneumothorax is noted. Mild bilateral pleural effusions are noted with adjacent subsegmental atelectasis. Multifocal  densities are noted in both upper lobes concerning for edema or possibly inflammation. No significant osseous abnormality is noted in the chest. Mild cardiomegaly is noted. Patient is status post left ventricular assistance device placement. Surgical wound in the epigastric region is noted containing debris which may represent inflammatory material. There does not appear to be a significant fluid collection in any deep location. Left subclavian pacemaker is noted. Mildly enlarged adenopathy is noted in the aortopulmonary window which is not significantly changed compared to prior exam and most likely reactive in etiology. Feeding tube is seen passing through the esophagus and stomach into the proximal duodenum. CT ABDOMEN AND PELVIS Status post cholecystectomy. No focal abnormality is noted in the liver, spleen or pancreas on these unenhanced images. Adrenal glands and kidneys appear normal. No hydronephrosis or renal obstruction is noted mild amount of free fluid is seen around the inferior portion of the liver. There is no evidence of bowel obstruction. The appendix appears normal. Urinary bladder is decompressed secondary to Foley catheter. Moderate amount of free fluid is noted in the pelvis with Hounsfield measurement of 11 consistent with fluid density. Mild anasarca is noted. Diverticulosis is noted throughout the colon without inflammation. Mildly enlarged bilateral inguinal lymph nodes are noted which most likely are inflammatory in etiology. IMPRESSION: Mild bilateral pleural effusions are noted with adjacent subsegmental atelectasis. Status post left ventricular assistance device placement. Open surgical wound in the epigastric region contains debris which may represent inflammatory material. Clinical correlation is recommended. No deep abscess or fluid collection is noted. Diverticulosis is noted throughout the colon without inflammation. Mild anasarca is noted. Mild amount of free fluid is noted around  the liver, with moderate amount of free fluid seen in the pelvis. Mildly enlarged bilateral inguinal adenopathy is noted most likely inflammatory in etiology. Electronically Signed   By: Marijo Conception, M.D.   On: 09/11/2015 10:10   Ct Chest Wo Contrast  09/11/2015  CLINICAL DATA:  Anemia, abdominal distention. EXAM: CT CHEST, ABDOMEN AND PELVIS WITHOUT CONTRAST TECHNIQUE: Multidetector CT imaging of the chest, abdomen and pelvis was performed following the standard protocol without IV contrast. COMPARISON:  CT scan of November 30, 2014. FINDINGS: CT CHEST No pneumothorax is noted. Mild bilateral  pleural effusions are noted with adjacent subsegmental atelectasis. Multifocal densities are noted in both upper lobes concerning for edema or possibly inflammation. No significant osseous abnormality is noted in the chest. Mild cardiomegaly is noted. Patient is status post left ventricular assistance device placement. Surgical wound in the epigastric region is noted containing debris which may represent inflammatory material. There does not appear to be a significant fluid collection in any deep location. Left subclavian pacemaker is noted. Mildly enlarged adenopathy is noted in the aortopulmonary window which is not significantly changed compared to prior exam and most likely reactive in etiology. Feeding tube is seen passing through the esophagus and stomach into the proximal duodenum. CT ABDOMEN AND PELVIS Status post cholecystectomy. No focal abnormality is noted in the liver, spleen or pancreas on these unenhanced images. Adrenal glands and kidneys appear normal. No hydronephrosis or renal obstruction is noted mild amount of free fluid is seen around the inferior portion of the liver. There is no evidence of bowel obstruction. The appendix appears normal. Urinary bladder is decompressed secondary to Foley catheter. Moderate amount of free fluid is noted in the pelvis with Hounsfield measurement of 11 consistent with  fluid density. Mild anasarca is noted. Diverticulosis is noted throughout the colon without inflammation. Mildly enlarged bilateral inguinal lymph nodes are noted which most likely are inflammatory in etiology. IMPRESSION: Mild bilateral pleural effusions are noted with adjacent subsegmental atelectasis. Status post left ventricular assistance device placement. Open surgical wound in the epigastric region contains debris which may represent inflammatory material. Clinical correlation is recommended. No deep abscess or fluid collection is noted. Diverticulosis is noted throughout the colon without inflammation. Mild anasarca is noted. Mild amount of free fluid is noted around the liver, with moderate amount of free fluid seen in the pelvis. Mildly enlarged bilateral inguinal adenopathy is noted most likely inflammatory in etiology. Electronically Signed   By: Marijo Conception, M.D.   On: 09/11/2015 10:10   Dg Chest Port 1 View  09/11/2015  CLINICAL DATA:  LEFT ventricular assist device EXAM: PORTABLE CHEST 1 VIEW COMPARISON:  Portable exam 0623 hours compared to 09/09/2015 FINDINGS: RIGHT subclavian central venous catheter tip projecting over cavoatrial junction. Feeding tube extends into stomach. LEFT ventricular assist device identified at the inferior margin of the exam. LEFT subclavian AICD with lead projecting over RIGHT ventricle. Enlargement of cardiac silhouette with pulmonary vascular congestion. Diffuse pulmonary infiltrates likely pulmonary edema. No pneumothorax. IMPRESSION: No interval change in pulmonary edema. Electronically Signed   By: Lavonia Dana M.D.   On: 09/11/2015 07:55   Dg Abd Portable 1v  09/11/2015  CLINICAL DATA:  Portable abdominal radiograph, emesis today. EXAM: PORTABLE ABDOMEN - 1 VIEW COMPARISON:  Abdominal radiograph of Sep 10, 2015 FINDINGS: There is mild gaseous distention of the stomach. The feeding tube tip appears to lie in the region of the duodenal bulb. The small and  large bowel gas pattern is within the limits of normal. The left ventricular assist device where visualized is appropriately positioned. IMPRESSION: Increased volume of gas within the stomach. The small and large bowel gas pattern remains within the limits of normal. The feeding tube tip lies in the region of the duodenal bulb. Electronically Signed   By: David  Martinique M.D.   On: 09/11/2015 07:53   Dg Abd Portable 1v  09/10/2015  CLINICAL DATA:  Feeding tube placement EXAM: PORTABLE ABDOMEN - 1 VIEW COMPARISON:  Portable exam 1528 hours compared to 09/10/2015 at 1345 hours FINDINGS: Feeding tube  traverses stomach with tip projecting over either the duodenal bulb or proximal descending duodenum. LVAD projects over upper abdomen. AICD lead projects over RIGHT ventricle. Visualized bowel gas pattern normal. IMPRESSION: Tip of feeding tube projects over either the duodenal bulb or proximal descending duodenum. Electronically Signed   By: Lavonia Dana M.D.   On: 09/10/2015 15:44   Dg Abd Portable 1v  09/10/2015  CLINICAL DATA:  Abdominal distention and emesis. EXAM: PORTABLE ABDOMEN - 1 VIEW COMPARISON:  One-view abdomen 09/04/2015. FINDINGS: A left ventricular assist device is in place. The bowel gas pattern is normal. Previously noted gastric distention has resolved. There is no obstruction or free air. IMPRESSION: Normal bowel gas pattern. Electronically Signed   By: San Morelle M.D.   On: 09/10/2015 13:58     Medications:     Scheduled Medications: . sodium chloride   Intravenous Once  . allopurinol  100 mg Oral Daily  . amiodarone  100 mg Oral Daily  .  ceFAZolin (ANCEF) IV  1 g Intravenous Q24H  . citalopram  20 mg Oral Daily  . docusate sodium  200 mg Oral Daily  . feeding supplement (PRO-STAT SUGAR FREE 64)  30 mL Per Tube TID  . ferrous sulfate  325 mg Oral BID WC  . guaiFENesin  600 mg Oral BID  . magic mouthwash  10 mL Oral TID  . metoCLOPramide (REGLAN) injection  5 mg  Intravenous Q6H  . pantoprazole (PROTONIX) IV  40 mg Intravenous Daily  . pregabalin  75 mg Oral Daily  . rifampin  300 mg Oral Q8H  . sildenafil  20 mg Oral Q8H  . simethicone  80 mg Oral QID  . sodium chloride flush  10-40 mL Intracatheter Q12H  . sodium chloride flush  3 mL Intravenous Q12H  . tamsulosin  0.4 mg Oral Daily  . vancomycin  125 mg Oral Q8H    Infusions: . feeding supplement (NEPRO CARB STEADY) 1,000 mL (09/12/15 0500)    PRN Medications: sodium chloride, sodium chloride, sodium chloride, alteplase, bisacodyl **OR** bisacodyl, fentaNYL (SUBLIMAZE) injection, gelatin adsorbable, Gerhardt's butt cream, ondansetron, oxyCODONE, sodium chloride flush, sodium chloride flush, traMADol   Assessment:   1. LVAD Complication- Driveline abscess 2. MSSA bacteremia -> septic shock 3. Acute/Chronic systolic HF s/p VAD placement 9/16 4. C. Difficile colitis 5. 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 Acute blood loss anemia 8. Severe Malnutrition.   9.. ARF 10. Klebsiella (ESBL) pneumonia 11. NSVT  12. Hypokalemia  13. RV Failure   Plan/Discussion:    S/P I&D LVAD driveline abscess. Exposed hardware.   09/09/15 09/09/15  S/P ABRA and reapplication of ACell. Adjustments per Plastics. WBC 16.5. Abd distended.   ID following. Now on ceftriaxone for klebsiella PNA and MSSA. Oral vanc for c.diff.   No ectopy over night. K 3.2. Give 40 KDur.   CO-OX 66%  Has RV failure. Maps 80-90s. Continue 20 mg sildenafil.   Tolerating HD. Making some urine.   VAD parameters ok. Hgb this morning 9.9. Anticipate restarting heparin today. Per Dr Darcey Nora.  On tube feeds.   PT following. Recommending CIR. Will discuss with Rehab Coordinator.   Length of Stay: [place] NP-C  09/12/2015, 7:01 AM  VAD Team --- VAD ISSUES ONLY--- Pager [phone] (7am - 7am)  Advanced Heart Failure Team  Pager [phone] (M-F; 7a - 4p)  Please contact [facility] for night-coverage after hours (4p -7a ) and  weekends on [email or web address]  Patient seen and examined with Darrick Grinder, NP. We discussed all aspects of the encounter. I agree with the assessment and plan as stated above.   Had another bloody emesis this am. Now feeling better. Hgb stable. Will follow. Continue Protonix. Can restart heparin carefully.   Tolerated HD well. Volume status looks OK.   NSVT improved.   Remains on abx, Wound Care following.   Agree with CIR referral when more stable.   VAD parameters ok.   [name], Daniel,MD 2:18 PM

## 2015-09-12 NOTE — Progress Notes (Signed)
Patient ID: [name], male   DOB: 10/12/1970, 45 y.o.   MRN: [id number]  [facility] Progress Note   Assessment/ Plan:   1. AKI Suspected to be ATN from sepsis/shock. Remains oliguric and underwent hemodialysis yesterday primarily for clearance but with some ultrafiltration as well. Mild edema on physical exam, no uremic signs or symptoms. Continue daily monitoring for indicators of dialysis- tentatively scheduled for dialysis tomorrow. 2. LVAD MSSA driveline infection/infected VAD pocket: On intravenous antibiotic therapy and now status post debridement and initial step towards wound closure. On oral rifampin.. 3. Clostridium difficile colitis: On oral vancomycin, reports significant clinical improvement. 4. Hypokalemia: Likely from GI/dialysis associated losses-status post total replacement this morning 5. Nonischemic cardiomyopathy/chronic systolic heart failure: supported with LVAD.  Subjective:   Vomited this morning, without overt or obvious intra-abdominal blood loss.    Objective:   BP 118/82 mmHg  Pulse 85  Temp(Src) 98.1 F (36.7 C) (Oral)  Resp 23  Ht 5\' 7"  (1.702 m)  Wt 83 kg (182 lb 15.7 oz)  BMI 28.65 kg/m2  SpO2 100%  Intake/Output Summary (Last 24 hours) at 09/12/15 0843 Last data filed at 09/12/15 0800  Gross per 24 hour  Intake   2542 ml  Output   2750 ml  Net   -208 ml   Weight change: -4.3 kg (-9 lb 7.7 oz)  Physical Exam: [id number] resting in bed CVS: Pulse regular rhythm, normal rate Resp: Poor inspiratory effort-decreased breath sounds bilaterally Abd: Slightly tender, distended and firm Ext: Trace- 1+ lower extremity edema  Imaging: Ct Abdomen Wo Contrast  09/11/2015  CLINICAL DATA:  Anemia, abdominal distention. EXAM: CT CHEST, ABDOMEN AND PELVIS WITHOUT CONTRAST TECHNIQUE: Multidetector CT imaging of the chest, abdomen and pelvis was performed following the standard protocol without IV contrast. COMPARISON:  CT scan of  November 30, 2014. FINDINGS: CT CHEST No pneumothorax is noted. Mild bilateral pleural effusions are noted with adjacent subsegmental atelectasis. Multifocal densities are noted in both upper lobes concerning for edema or possibly inflammation. No significant osseous abnormality is noted in the chest. Mild cardiomegaly is noted. Patient is status post left ventricular assistance device placement. Surgical wound in the epigastric region is noted containing debris which may represent inflammatory material. There does not appear to be a significant fluid collection in any deep location. Left subclavian pacemaker is noted. Mildly enlarged adenopathy is noted in the aortopulmonary window which is not significantly changed compared to prior exam and most likely reactive in etiology. Feeding tube is seen passing through the esophagus and stomach into the proximal duodenum. CT ABDOMEN AND PELVIS Status post cholecystectomy. No focal abnormality is noted in the liver, spleen or pancreas on these unenhanced images. Adrenal glands and kidneys appear normal. No hydronephrosis or renal obstruction is noted mild amount of free fluid is seen around the inferior portion of the liver. There is no evidence of bowel obstruction. The appendix appears normal. Urinary bladder is decompressed secondary to Foley catheter. Moderate amount of free fluid is noted in the pelvis with Hounsfield measurement of 11 consistent with fluid density. Mild anasarca is noted. Diverticulosis is noted throughout the colon without inflammation. Mildly enlarged bilateral inguinal lymph nodes are noted which most likely are inflammatory in etiology. IMPRESSION: Mild bilateral pleural effusions are noted with adjacent subsegmental atelectasis. Status post left ventricular assistance device placement. Open surgical wound in the epigastric region contains debris which may represent inflammatory material. Clinical correlation is recommended. No deep abscess or  fluid collection  is noted. Diverticulosis is noted throughout the colon without inflammation. Mild anasarca is noted. Mild amount of free fluid is noted around the liver, with moderate amount of free fluid seen in the pelvis. Mildly enlarged bilateral inguinal adenopathy is noted most likely inflammatory in etiology. Electronically Signed   By: Marijo Conception, M.D.   On: 09/11/2015 10:10   Ct Chest Wo Contrast  09/11/2015  CLINICAL DATA:  Anemia, abdominal distention. EXAM: CT CHEST, ABDOMEN AND PELVIS WITHOUT CONTRAST TECHNIQUE: Multidetector CT imaging of the chest, abdomen and pelvis was performed following the standard protocol without IV contrast. COMPARISON:  CT scan of November 30, 2014. FINDINGS: CT CHEST No pneumothorax is noted. Mild bilateral pleural effusions are noted with adjacent subsegmental atelectasis. Multifocal densities are noted in both upper lobes concerning for edema or possibly inflammation. No significant osseous abnormality is noted in the chest. Mild cardiomegaly is noted. Patient is status post left ventricular assistance device placement. Surgical wound in the epigastric region is noted containing debris which may represent inflammatory material. There does not appear to be a significant fluid collection in any deep location. Left subclavian pacemaker is noted. Mildly enlarged adenopathy is noted in the aortopulmonary window which is not significantly changed compared to prior exam and most likely reactive in etiology. Feeding tube is seen passing through the esophagus and stomach into the proximal duodenum. CT ABDOMEN AND PELVIS Status post cholecystectomy. No focal abnormality is noted in the liver, spleen or pancreas on these unenhanced images. Adrenal glands and kidneys appear normal. No hydronephrosis or renal obstruction is noted mild amount of free fluid is seen around the inferior portion of the liver. There is no evidence of bowel obstruction. The appendix appears normal.  Urinary bladder is decompressed secondary to Foley catheter. Moderate amount of free fluid is noted in the pelvis with Hounsfield measurement of 11 consistent with fluid density. Mild anasarca is noted. Diverticulosis is noted throughout the colon without inflammation. Mildly enlarged bilateral inguinal lymph nodes are noted which most likely are inflammatory in etiology. IMPRESSION: Mild bilateral pleural effusions are noted with adjacent subsegmental atelectasis. Status post left ventricular assistance device placement. Open surgical wound in the epigastric region contains debris which may represent inflammatory material. Clinical correlation is recommended. No deep abscess or fluid collection is noted. Diverticulosis is noted throughout the colon without inflammation. Mild anasarca is noted. Mild amount of free fluid is noted around the liver, with moderate amount of free fluid seen in the pelvis. Mildly enlarged bilateral inguinal adenopathy is noted most likely inflammatory in etiology. Electronically Signed   By: Marijo Conception, M.D.   On: 09/11/2015 10:10   Dg Chest Port 1 View  09/11/2015  CLINICAL DATA:  LEFT ventricular assist device EXAM: PORTABLE CHEST 1 VIEW COMPARISON:  Portable exam 0623 hours compared to 09/09/2015 FINDINGS: RIGHT subclavian central venous catheter tip projecting over cavoatrial junction. Feeding tube extends into stomach. LEFT ventricular assist device identified at the inferior margin of the exam. LEFT subclavian AICD with lead projecting over RIGHT ventricle. Enlargement of cardiac silhouette with pulmonary vascular congestion. Diffuse pulmonary infiltrates likely pulmonary edema. No pneumothorax. IMPRESSION: No interval change in pulmonary edema. Electronically Signed   By: Lavonia Dana M.D.   On: 09/11/2015 07:55   Dg Abd Portable 1v  09/11/2015  CLINICAL DATA:  Portable abdominal radiograph, emesis today. EXAM: PORTABLE ABDOMEN - 1 VIEW COMPARISON:  Abdominal radiograph  of Sep 10, 2015 FINDINGS: There is mild gaseous distention of the stomach.  The feeding tube tip appears to lie in the region of the duodenal bulb. The small and large bowel gas pattern is within the limits of normal. The left ventricular assist device where visualized is appropriately positioned. IMPRESSION: Increased volume of gas within the stomach. The small and large bowel gas pattern remains within the limits of normal. The feeding tube tip lies in the region of the duodenal bulb. Electronically Signed   By: David  Martinique M.D.   On: 09/11/2015 07:53   Dg Abd Portable 1v  09/10/2015  CLINICAL DATA:  Feeding tube placement EXAM: PORTABLE ABDOMEN - 1 VIEW COMPARISON:  Portable exam 1528 hours compared to 09/10/2015 at 1345 hours FINDINGS: Feeding tube traverses stomach with tip projecting over either the duodenal bulb or proximal descending duodenum. LVAD projects over upper abdomen. AICD lead projects over RIGHT ventricle. Visualized bowel gas pattern normal. IMPRESSION: Tip of feeding tube projects over either the duodenal bulb or proximal descending duodenum. Electronically Signed   By: Lavonia Dana M.D.   On: 09/10/2015 15:44   Dg Abd Portable 1v  09/10/2015  CLINICAL DATA:  Abdominal distention and emesis. EXAM: PORTABLE ABDOMEN - 1 VIEW COMPARISON:  One-view abdomen 09/04/2015. FINDINGS: A left ventricular assist device is in place. The bowel gas pattern is normal. Previously noted gastric distention has resolved. There is no obstruction or free air. IMPRESSION: Normal bowel gas pattern. Electronically Signed   By: San Morelle M.D.   On: 09/10/2015 13:58    Labs: BMET  Recent Labs Lab 09/07/15 0400 09/08/15 1201 09/09/15 1545 09/10/15 0155 09/10/15 1025 09/10/15 1250 09/10/15 1526 09/11/15 0515 09/11/15 0820 09/12/15 0245  NA 139 135 139 136  --  135 136 141 136 135  K 3.3* 3.0* 3.4* 3.0* 3.3* 3.4* 5.5* <2.0* 3.2* 3.2*  CL 103 102 100* 100*  --  104 102 119* 103 99*  CO2  20* 22  --  26  --  25  --  16* 22 27  GLUCOSE 82 105* 82 101*  --  81 72 84 88 92  BUN 44* 53* 26* 32*  --  39* 37* 31* 50* 18  CREATININE 7.49* 7.03* 3.90* 5.54*  --  5.71* 6.10* 4.02* 6.76* 3.50*  CALCIUM 8.4* 8.5*  --  8.1*  --  7.8*  --  4.9* 8.1* 7.9*  PHOS  --  5.8*  --   --   --   --   --   --   --   --    CBC  Recent Labs Lab 09/10/15 1250 09/10/15 1526 09/11/15 0515 09/11/15 1300 09/12/15 0245  WBC 13.5*  --  9.7 17.5* 16.5*  HGB 8.3* 8.5* 4.2* 9.7* 9.9*  HCT 24.0* 25.0* 12.3* 28.7* 28.8*  MCV 77.2*  --  76.4* 79.1 78.7  PLT 208  --  94* 153 182    Medications:    . sodium chloride   Intravenous Once  . allopurinol  100 mg Oral Daily  . amiodarone  100 mg Oral Daily  .  ceFAZolin (ANCEF) IV  1 g Intravenous Q24H  . citalopram  20 mg Oral Daily  . docusate sodium  200 mg Oral Daily  . feeding supplement (PRO-STAT SUGAR FREE 64)  30 mL Per Tube TID  . ferrous sulfate  325 mg Oral BID WC  . guaiFENesin  600 mg Oral BID  . magic mouthwash  10 mL Oral TID  . metoCLOPramide (REGLAN) injection  5 mg Intravenous Q6H  .  pantoprazole (PROTONIX) IV  40 mg Intravenous Daily  . pregabalin  75 mg Oral Daily  . rifampin  300 mg Oral Q8H  . sildenafil  20 mg Oral Q8H  . simethicone  80 mg Oral QID  . sodium chloride flush  10-40 mL Intracatheter Q12H  . sodium chloride flush  3 mL Intravenous Q12H  . tamsulosin  0.4 mg Oral Daily  . vancomycin  125 mg Oral Q8H   Elmarie Shiley, MD 09/12/2015, 8:43 AM

## 2015-09-12 NOTE — Progress Notes (Signed)
ANTICOAGULATION CONSULT NOTE - Follow Up Consult  Pharmacy Consult for Heparin Indication: LVAD  Allergies  Allergen Reactions  . Phytonadione Other (See Comments)    Patient has LVAD: please check with LVAD coordinator on call or LVAD MD on call before reversal of anticoagulation with vit k    Patient Measurements: Height: 5\' 7"  (170.2 cm) Weight: 182 lb 15.7 oz (83 kg) IBW/kg (Calculated) : 66.1 Heparin Dosing Weight:  85 kg  Vital Signs: Temp: 98.1 F (36.7 C) (05/25 0400) Temp Source: Oral (05/25 0400) BP: 90/74 mmHg (05/25 1100) Pulse Rate: 85 (05/25 0800)  Labs:  Recent Labs  09/10/15 0155  09/11/15 0020 09/11/15 0515 09/11/15 0820 09/11/15 1300 09/12/15 0245 09/12/15 0930  HGB 8.8*  < >  --  4.2*  --  9.7* 9.9* 9.4*  HCT 25.3*  < >  --  12.3*  --  28.7* 28.8* 28.0*  PLT 181  < >  --  94*  --  153 182 178  HEPARINUNFRC <0.10*  --  <0.10* <0.10*  --   --   --   --   CREATININE 5.54*  < >  --  4.02* 6.76*  --  3.50*  --   < > = values in this interval not displayed.  Estimated Creatinine Clearance: 26.9 mL/min (by C-G formula based on Cr of 3.5).  Assessment: 46 yom on Coumadin pta for his LVAD, admitted with driveline abscess, sternal wound infection, and MSSA bacteremia. Home Coumadin held, and heparin bridge started 5/10 pending OR trips for wound debridement.   Heparin remains < 0.1 and bleeding continues to be an issue so MD has asked that rate not be increased. Will need to discuss rate adjustment and recommendations with Dr. Darcey Nora S/p possible bleeding 5/24 with drop H/H received 2uts PRBC and CBC stable today Discussed  heparin with MD will restart 5/25 afternoon at  1000 units/hr without titration and monitor bleeding.    Goal of Therapy:  Heparin level 0.3-0.4 eventually when more stable Monitor platelets by anticoagulation protocol: Yes   Plan:  restart heparin 1000 units/hr and monitor for bleeding No further up titration of heparin even  with low HL Daily heparin level and CBC. F/u plans to resume Coumadin eventually.  Bonnita Nasuti Pharm.D. CPP, BCPS Clinical Pharmacist [phone] 09/12/2015 12:34 PM

## 2015-09-12 NOTE — Consult Note (Signed)
Physical Medicine and Rehabilitation Consult Reason for Consult: Debilitation/MSSA/history of LVAD Referring Physician: Dr. Haroldine Laws   HPI: [name] is a 45 y.o. right handed male with history of systolic congestive heart failure, LVAD 12/20/2014, atrial fibrillation maintained on Coumadin, medical noncompliance. Patient lives alone in [place]. Independent prior to admission. He has a brother in [place]. Recently traveled to [place] to see  his mother and developed fever chills and abdominal pain. Went to [facility] in [place]. Initially felt to have pyelonephritis but CT scan negative. Found to have C. difficile colitis placed on oral vancomycin. Developed subxiphoid swelling and subsequent had a CT which showed large abscess surrounding driveline at LVAD site. Blood cultures showed MSSA. Seen by infectious disease started on nafcillin and transferred to [facility] for further management. Await plans for drainage of abscess probable pump exchange. Followed by infectious disease await plan for possible TEE. Patient underwent irrigation and debridement of chest sternal wound 09/09/2015. Hospital course ileus and diet slowly advanced with nasogastric tube remaining in place. Nephrology consulted 09/03/2015 for AKI elevated 1.40-3.89-7.13. Renal ultrasound negative. We plan on dialysis. Physical therapy evaluation completed with recommendations to physical medicine rehabilitation consult.   Review of Systems  Constitutional: Positive for fever and chills.  HENT: Negative for hearing loss.   Eyes: Negative for blurred vision and double vision.  Respiratory: Positive for shortness of breath. Negative for cough.   Cardiovascular: Positive for palpitations and leg swelling. Negative for chest pain.  Gastrointestinal: Positive for nausea and constipation. Negative for vomiting.  Musculoskeletal: Positive for myalgias.  Neurological: Positive for  weakness. Negative for seizures and headaches.  All other systems reviewed and are negative.  Past Medical History  Diagnosis Date  . Chronic systolic heart failure (HCC)     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%)  . Atrial fibrillation -parosysmal      Rx w amiodarone  . Noncompliance     H/O  MEDICAL NONCOMPLIANCE  . 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 Tricuspid valve regurgitation     SEVERE  . Severe mitral regurgitation   . Polymorphic ventricular tachycardia (HCC)     RECURRENT WITH APPROPRIATE SHOCK THERAPY IN THE PAST  . Ventricular fibrillation (HCC)     WITH APPROPRIATE SHOCK THERAPY IN THE PAST  . Hypertension   . Gout   . RA (rheumatoid arthritis) ([place])   . Automatic implantable cardiac defibrillator -BSX     single chamber  . GI bleed -massive     11 Units 2012  . Elevated LFTs   . H/O hyperthyroidism   . CHF (congestive heart failure) ([place])   . AKI (acute kidney injury) ([place])   . AICD (automatic cardioverter/defibrillator) present    Past Surgical History  Procedure Laterality Date  . Cardiac catheterization  06/2006    RIGHT HEART CATH SHOWING SEVERE BIVENTRICUALR CHF WITH MARKED FILLING AND PRESSURES  . Insert / replace / remove pacemaker      GUIDANT HE ICD MODEL 2180, SERIAL # [id number]  . Cholecystectomy    . Implantable cardioverter defibrillator generator change N/A 07/01/2011    Procedure: IMPLANTABLE CARDIOVERTER DEFIBRILLATOR GENERATOR CHANGE;  Surgeon: Deboraha Sprang, MD;  Location: [facility] CATH LAB;  Service: Cardiovascular;  Laterality: N/A;  . Tee without cardioversion N/A 12/12/2014    Procedure: TRANSESOPHAGEAL ECHOCARDIOGRAM (TEE);  Surgeon: Jerline Pain, MD;  Location: [place];  Service: Cardiovascular;  Laterality: N/A;  . Cardiac catheterization N/A 12/14/2014    Procedure: Right Heart Cath;  Surgeon: Jolaine Artist, MD;  Location: [place] CV LAB;  Service: Cardiovascular;   Laterality: N/A;  . Cardiac catheterization N/A 12/14/2014    Procedure: IABP Insertion;  Surgeon: Jolaine Artist, MD;  Location: [place] CV LAB;  Service: Cardiovascular;  Laterality: N/A;  . Insertion of implantable left ventricular assist device N/A 12/20/2014    Procedure: INSERTION OF IMPLANTABLE LEFT VENTRICULAR ASSIST DEVICE;  Surgeon: Ivin Poot, MD;  Location: [place];  Service: Open Heart Surgery;  Laterality: N/A;  CIRC ARREST  NITRIC OXIDE  . Tee without cardioversion N/A 12/20/2014    Procedure: TRANSESOPHAGEAL ECHOCARDIOGRAM (TEE);  Surgeon: Ivin Poot, MD;  Location: [place];  Service: Open Heart Surgery;  Laterality: N/A;  . Sternal wound debridement N/A 08/27/2015    Procedure: WOUND IRRIGATION AND DEBRIDEMENT;  Surgeon: Ivin Poot, MD;  Location: [place];  Service: Thoracic;  Laterality: N/A;  . Application of wound vac N/A 08/27/2015    Procedure: APPLICATION OF WOUND VAC;  Surgeon: Ivin Poot, MD;  Location: [place];  Service: Thoracic;  Laterality: N/A;  . Sternal wound debridement N/A 08/29/2015    Procedure: DEBRIDEMENT OF chest wound;  Surgeon: Ivin Poot, MD;  Location: [place];  Service: Thoracic;  Laterality: N/A;  . Application of wound vac N/A 08/29/2015    Procedure: WOUND VAC CHANGE;  Surgeon: Ivin Poot, MD;  Location: [place];  Service: Thoracic;  Laterality: N/A;  . Incision and drainage of wound N/A 09/05/2015    Procedure: IRRIGATION AND DEBRIDEMENT sternal WOUND;  Surgeon: Loel Lofty Dillingham, DO;  Location: [place];  Service: Plastics;  Laterality: N/A;  . Application of a-cell of extremity N/A 09/05/2015    Procedure: APPLICATION OF A-CELL OF sternum;  Surgeon: Loel Lofty Dillingham, DO;  Location: [place];  Service: Plastics;  Laterality: N/A;  . Application of wound vac N/A 09/05/2015    Procedure: APPLICATION OF WOUND VAC to sternum;  Surgeon: Loel Lofty Dillingham, DO;  Location: [place];  Service: Plastics;  Laterality: N/A;  . Insertion of  dialysis catheter Right 09/05/2015    Procedure: INSERTION OF DIALYSIS CATHETER;RIGHT SUBCLAVIAN;  Surgeon: Loel Lofty Dillingham, DO;  Location: [place];  Service: Plastics;  Laterality: Right;  . Pectoralis flap N/A 09/09/2015    Procedure: DEBRIDEMENT AND CLOSURE WOUND WITH PLACEMENT OF ABRA CLOSURE DEVICE;  Surgeon: Loel Lofty Dillingham, DO;  Location: [place];  Service: Plastics;  Laterality: N/A;   Family History  Problem Relation Age of Onset  . Heart failure Brother    Social History:  reports that he has never smoked. He has never used smokeless tobacco. He reports that he does not drink alcohol or use illicit drugs. Allergies:  Allergies  Allergen Reactions  . Phytonadione Other (See Comments)    Patient has LVAD: please check with LVAD coordinator on call or LVAD MD on call before reversal of anticoagulation with vit k   Medications Prior to Admission  Medication Sig Dispense Refill  . allopurinol (ZYLOPRIM) 300 MG tablet Take 1 tablet (300 mg total) by mouth daily. 30 tablet 6  . amiodarone (PACERONE) 200 MG tablet Take 0.5 tablets (100 mg total) by mouth daily. 30 tablet 6  . aspirin EC 81 MG tablet Take 81 mg by mouth daily.    . citalopram (CELEXA) 20 MG tablet Take 1 tablet (20 mg total) by mouth  daily. 30 tablet 6  . colchicine 0.6 MG tablet Take 1 tablet (0.6 mg total) by mouth 2 (two) times daily as needed (as needed for gout pain). 30 tablet 6  . digoxin (LANOXIN) 0.125 MG tablet Take 1 tablet (0.125 mg total) by mouth daily. 30 tablet 6  . ferrous sulfate 325 (65 FE) MG tablet Take 1 tablet (325 mg total) by mouth 2 (two) times daily with a meal. 60 tablet 3  . furosemide (LASIX) 40 MG tablet Take 40 mg by mouth 2 (two) times a week.  0  . hydrALAZINE (APRESOLINE) 100 MG tablet Take 1 tablet (100 mg total) by mouth every 8 (eight) hours. 90 tablet 3  . lisinopril (PRINIVIL,ZESTRIL) 20 MG tablet Take 1 tablet (20 mg total) by mouth daily. 30 tablet 6  . [EXPIRED] nystatin  (MYCOSTATIN) 100000 UNIT/ML suspension Take 5 mLs by mouth 2 (two) times daily.    . pantoprazole (PROTONIX) 40 MG tablet Take 1 tablet (40 mg total) by mouth daily. 30 tablet 6  . potassium chloride SA (K-DUR,KLOR-CON) 20 MEQ tablet Take 40 mEq by mouth 2 (two) times a week.    . pregabalin (LYRICA) 75 MG capsule Take 1 capsule (75 mg total) by mouth 2 (two) times daily. 60 capsule 11  . sildenafil (REVATIO) 20 MG tablet Take 1 tablet (20 mg total) by mouth 3 (three) times daily. 90 tablet 6  . spironolactone (ALDACTONE) 25 MG tablet Take 0.5 tablets (12.5 mg total) by mouth daily. 90 tablet 3  . vancomycin (VANCOCIN) 125 MG capsule Take by mouth.     . warfarin (COUMADIN) 2 MG tablet Take 4 mg daily except 6 mg on Saturdays: may adjust just as needed per [facility] (Patient taking differently: Take 4 mg by mouth daily at 6 PM. ) 90 tablet 6    Home: Home Living Family/patient expects to be discharged to:: Private residence Living Arrangements: Alone Available Help at Discharge: Family, Available 24 hours/day (sister and brother are coming to assist) Type of Home: House Home Access: Level entry Home Layout: One level Bathroom Shower/Tub: Chiropodist: Standard Bathroom Accessibility: Yes Home Equipment: None  Functional History: Prior Function Level of Independence: Independent Comments: Driving Functional Status:  Mobility: Bed Mobility Overal bed mobility: Needs Assistance, +2 for physical assistance Bed Mobility: Rolling, Sidelying to Sit Rolling: Mod assist Sidelying to sit: Mod assist, +2 for safety/equipment, HOB elevated Supine to sit: Mod assist, +2 for physical assistance General bed mobility comments: rolling to protect incision; pt with good effort to push to sitting Transfers Overall transfer level: Needs assistance Equipment used: Rolling walker (2 wheeled) Transfers: Sit to/from Stand Sit to Stand: Min assist, +2 physical assistance, +2  safety/equipment Stand pivot transfers: +2 physical assistance, +2 safety/equipment, Min assist General transfer comment: assist slightly to power up. Ambulation/Gait Ambulation/Gait assistance: Min guard, Min assist, +2 safety/equipment Ambulation Distance (Feet): 140 Feet (50 feet then 75 feet then 15 feet.) Assistive device: Rolling walker (2 wheeled) Gait Pattern/deviations: Step-through pattern, Decreased stride length, Trunk flexed, Wide base of support General Gait Details: Needed several sitting rest breaks.  Followed pt with chair.  Pt needing mostly cues only and steadying assist.  Gait velocity interpretation: Below normal speed for age/gender    ADL:    Cognition: Cognition Overall Cognitive Status: Within Functional Limits for tasks assessed Orientation Level: Oriented X4 Cognition Arousal/Alertness: Awake/alert Behavior During Therapy: Flat affect Overall Cognitive Status: Within Functional Limits for tasks assessed Difficult to assess  due to: Level of arousal  Blood pressure 130/85, pulse 84, temperature 98.1 F (36.7 C), temperature source Oral, resp. rate 20, height 5\' 7"  (1.702 m), weight 83 kg (182 lb 15.7 oz), SpO2 100 %. Physical Exam  Constitutional: He is oriented to person, place, and time.  HENT:  Head: Normocephalic.  Nasogastric tube in place  Eyes: EOM are normal.  Neck: Normal range of motion. Neck supple. No thyromegaly present.  Cardiovascular:  hum  Respiratory:  Decreased breath sounds at the bases  GI:  Mildly distended, nontender. Distant bowel sounds  Neurological: He is alert and oriented to person, place, and time. No cranial nerve deficit. Coordination normal.  UE grossly 4/5 prox to distal. LE: 3/5 HF 4- KE and 4/5 ADF/PF. No sensory findings.   Psychiatric: He has a normal mood and affect.    Results for orders placed or performed during the hospital encounter of 08/26/15 (from the past 24 hour(s))  Glucose, capillary     Status:  Abnormal   Collection Time: 09/11/15  7:51 AM  Result Value Ref Range   Glucose-Capillary 109 (H) 65 - 99 mg/dL   Comment 1 Capillary Specimen    Comment 2 Notify RN   Basic metabolic panel     Status: Abnormal   Collection Time: 09/11/15  8:20 AM  Result Value Ref Range   Sodium 136 135 - 145 mmol/L   Potassium 3.2 (L) 3.5 - 5.1 mmol/L   Chloride 103 101 - 111 mmol/L   CO2 22 22 - 32 mmol/L   Glucose, Bld 88 65 - 99 mg/dL   BUN 50 (H) 6 - 20 mg/dL   Creatinine, Ser 6.76 (H) 0.61 - 1.24 mg/dL   Calcium 8.1 (L) 8.9 - 10.3 mg/dL   GFR calc non Af Amer 9 (L) >60 mL/min   GFR calc Af Amer 10 (L) >60 mL/min   Anion gap 11 5 - 15  Magnesium     Status: Abnormal   Collection Time: 09/11/15  8:20 AM  Result Value Ref Range   Magnesium 2.7 (H) 1.7 - 2.4 mg/dL  Carboxyhemoglobin     Status: Abnormal   Collection Time: 09/11/15  8:25 AM  Result Value Ref Range   Total hemoglobin 10.5 (L) 13.5 - 18.0 g/dL   O2 Saturation 61.8 %   Carboxyhemoglobin 2.0 (H) 0.5 - 1.5 %   Methemoglobin 1.4 0.0 - 1.5 %  Glucose, capillary     Status: Abnormal   Collection Time: 09/11/15 12:56 PM  Result Value Ref Range   Glucose-Capillary 102 (H) 65 - 99 mg/dL   Comment 1 Notify RN   CBC     Status: Abnormal   Collection Time: 09/11/15  1:00 PM  Result Value Ref Range   WBC 17.5 (H) 4.0 - 10.5 K/uL   RBC 3.63 (L) 4.22 - 5.81 MIL/uL   Hemoglobin 9.7 (L) 13.0 - 17.0 g/dL   HCT 28.7 (L) 39.0 - 52.0 %   MCV 79.1 78.0 - 100.0 fL   MCH 26.7 26.0 - 34.0 pg   MCHC 33.8 30.0 - 36.0 g/dL   RDW 20.5 (H) 11.5 - 15.5 %   Platelets 153 150 - 400 K/uL  Glucose, capillary     Status: None   Collection Time: 09/11/15  3:45 PM  Result Value Ref Range   Glucose-Capillary 95 65 - 99 mg/dL  Glucose, capillary     Status: None   Collection Time: 09/11/15  7:37 PM  Result Value  Ref Range   Glucose-Capillary 94 65 - 99 mg/dL   Comment 1 Capillary Specimen    Comment 2 Notify RN    Comment 3 Document in Chart     Glucose, capillary     Status: Abnormal   Collection Time: 09/11/15 11:44 PM  Result Value Ref Range   Glucose-Capillary 100 (H) 65 - 99 mg/dL   Comment 1 Capillary Specimen    Comment 2 Notify RN    Comment 3 Document in Chart   Lactate dehydrogenase     Status: Abnormal   Collection Time: 09/12/15  2:45 AM  Result Value Ref Range   LDH 294 (H) 98 - 192 U/L  CBC     Status: Abnormal   Collection Time: 09/12/15  2:45 AM  Result Value Ref Range   WBC 16.5 (H) 4.0 - 10.5 K/uL   RBC 3.66 (L) 4.22 - 5.81 MIL/uL   Hemoglobin 9.9 (L) 13.0 - 17.0 g/dL   HCT 28.8 (L) 39.0 - 52.0 %   MCV 78.7 78.0 - 100.0 fL   MCH 27.0 26.0 - 34.0 pg   MCHC 34.4 30.0 - 36.0 g/dL   RDW 20.5 (H) 11.5 - 15.5 %   Platelets 182 150 - 400 K/uL  Comprehensive metabolic panel     Status: Abnormal   Collection Time: 09/12/15  2:45 AM  Result Value Ref Range   Sodium 135 135 - 145 mmol/L   Potassium 3.2 (L) 3.5 - 5.1 mmol/L   Chloride 99 (L) 101 - 111 mmol/L   CO2 27 22 - 32 mmol/L   Glucose, Bld 92 65 - 99 mg/dL   BUN 18 6 - 20 mg/dL   Creatinine, Ser 3.50 (H) 0.61 - 1.24 mg/dL   Calcium 7.9 (L) 8.9 - 10.3 mg/dL   Total Protein 6.2 (L) 6.5 - 8.1 g/dL   Albumin 1.6 (L) 3.5 - 5.0 g/dL   AST 78 (H) 15 - 41 U/L   ALT 25 17 - 63 U/L   Alkaline Phosphatase 168 (H) 38 - 126 U/L   Total Bilirubin 8.8 (H) 0.3 - 1.2 mg/dL   GFR calc non Af Amer 19 (L) >60 mL/min   GFR calc Af Amer 22 (L) >60 mL/min   Anion gap 9 5 - 15  Carboxyhemoglobin     Status: Abnormal   Collection Time: 09/12/15  2:50 AM  Result Value Ref Range   Total hemoglobin 10.8 (L) 13.5 - 18.0 g/dL   O2 Saturation 66.1 %   Carboxyhemoglobin 1.9 (H) 0.5 - 1.5 %   Methemoglobin 1.5 0.0 - 1.5 %  Glucose, capillary     Status: None   Collection Time: 09/12/15  3:55 AM  Result Value Ref Range   Glucose-Capillary 94 65 - 99 mg/dL   Comment 1 Capillary Specimen    Comment 2 Notify RN    Comment 3 Document in Chart    Ct Abdomen Wo  Contrast  09/11/2015  CLINICAL DATA:  Anemia, abdominal distention. EXAM: CT CHEST, ABDOMEN AND PELVIS WITHOUT CONTRAST TECHNIQUE: Multidetector CT imaging of the chest, abdomen and pelvis was performed following the standard protocol without IV contrast. COMPARISON:  CT scan of November 30, 2014. FINDINGS: CT CHEST No pneumothorax is noted. Mild bilateral pleural effusions are noted with adjacent subsegmental atelectasis. Multifocal densities are noted in both upper lobes concerning for edema or possibly inflammation. No significant osseous abnormality is noted in the chest. Mild cardiomegaly is noted. Patient is status post left ventricular  assistance device placement. Surgical wound in the epigastric region is noted containing debris which may represent inflammatory material. There does not appear to be a significant fluid collection in any deep location. Left subclavian pacemaker is noted. Mildly enlarged adenopathy is noted in the aortopulmonary window which is not significantly changed compared to prior exam and most likely reactive in etiology. Feeding tube is seen passing through the esophagus and stomach into the proximal duodenum. CT ABDOMEN AND PELVIS Status post cholecystectomy. No focal abnormality is noted in the liver, spleen or pancreas on these unenhanced images. Adrenal glands and kidneys appear normal. No hydronephrosis or renal obstruction is noted mild amount of free fluid is seen around the inferior portion of the liver. There is no evidence of bowel obstruction. The appendix appears normal. Urinary bladder is decompressed secondary to Foley catheter. Moderate amount of free fluid is noted in the pelvis with Hounsfield measurement of 11 consistent with fluid density. Mild anasarca is noted. Diverticulosis is noted throughout the colon without inflammation. Mildly enlarged bilateral inguinal lymph nodes are noted which most likely are inflammatory in etiology. IMPRESSION: Mild bilateral pleural  effusions are noted with adjacent subsegmental atelectasis. Status post left ventricular assistance device placement. Open surgical wound in the epigastric region contains debris which may represent inflammatory material. Clinical correlation is recommended. No deep abscess or fluid collection is noted. Diverticulosis is noted throughout the colon without inflammation. Mild anasarca is noted. Mild amount of free fluid is noted around the liver, with moderate amount of free fluid seen in the pelvis. Mildly enlarged bilateral inguinal adenopathy is noted most likely inflammatory in etiology. Electronically Signed   By: Marijo Conception, M.D.   On: 09/11/2015 10:10   Ct Chest Wo Contrast  09/11/2015  CLINICAL DATA:  Anemia, abdominal distention. EXAM: CT CHEST, ABDOMEN AND PELVIS WITHOUT CONTRAST TECHNIQUE: Multidetector CT imaging of the chest, abdomen and pelvis was performed following the standard protocol without IV contrast. COMPARISON:  CT scan of November 30, 2014. FINDINGS: CT CHEST No pneumothorax is noted. Mild bilateral pleural effusions are noted with adjacent subsegmental atelectasis. Multifocal densities are noted in both upper lobes concerning for edema or possibly inflammation. No significant osseous abnormality is noted in the chest. Mild cardiomegaly is noted. Patient is status post left ventricular assistance device placement. Surgical wound in the epigastric region is noted containing debris which may represent inflammatory material. There does not appear to be a significant fluid collection in any deep location. Left subclavian pacemaker is noted. Mildly enlarged adenopathy is noted in the aortopulmonary window which is not significantly changed compared to prior exam and most likely reactive in etiology. Feeding tube is seen passing through the esophagus and stomach into the proximal duodenum. CT ABDOMEN AND PELVIS Status post cholecystectomy. No focal abnormality is noted in the liver, spleen or  pancreas on these unenhanced images. Adrenal glands and kidneys appear normal. No hydronephrosis or renal obstruction is noted mild amount of free fluid is seen around the inferior portion of the liver. There is no evidence of bowel obstruction. The appendix appears normal. Urinary bladder is decompressed secondary to Foley catheter. Moderate amount of free fluid is noted in the pelvis with Hounsfield measurement of 11 consistent with fluid density. Mild anasarca is noted. Diverticulosis is noted throughout the colon without inflammation. Mildly enlarged bilateral inguinal lymph nodes are noted which most likely are inflammatory in etiology. IMPRESSION: Mild bilateral pleural effusions are noted with adjacent subsegmental atelectasis. Status post left ventricular assistance device  placement. Open surgical wound in the epigastric region contains debris which may represent inflammatory material. Clinical correlation is recommended. No deep abscess or fluid collection is noted. Diverticulosis is noted throughout the colon without inflammation. Mild anasarca is noted. Mild amount of free fluid is noted around the liver, with moderate amount of free fluid seen in the pelvis. Mildly enlarged bilateral inguinal adenopathy is noted most likely inflammatory in etiology. Electronically Signed   By: Marijo Conception, M.D.   On: 09/11/2015 10:10   Dg Chest Port 1 View  09/11/2015  CLINICAL DATA:  LEFT ventricular assist device EXAM: PORTABLE CHEST 1 VIEW COMPARISON:  Portable exam 0623 hours compared to 09/09/2015 FINDINGS: RIGHT subclavian central venous catheter tip projecting over cavoatrial junction. Feeding tube extends into stomach. LEFT ventricular assist device identified at the inferior margin of the exam. LEFT subclavian AICD with lead projecting over RIGHT ventricle. Enlargement of cardiac silhouette with pulmonary vascular congestion. Diffuse pulmonary infiltrates likely pulmonary edema. No pneumothorax.  IMPRESSION: No interval change in pulmonary edema. Electronically Signed   By: Lavonia Dana M.D.   On: 09/11/2015 07:55   Dg Abd Portable 1v  09/11/2015  CLINICAL DATA:  Portable abdominal radiograph, emesis today. EXAM: PORTABLE ABDOMEN - 1 VIEW COMPARISON:  Abdominal radiograph of Sep 10, 2015 FINDINGS: There is mild gaseous distention of the stomach. The feeding tube tip appears to lie in the region of the duodenal bulb. The small and large bowel gas pattern is within the limits of normal. The left ventricular assist device where visualized is appropriately positioned. IMPRESSION: Increased volume of gas within the stomach. The small and large bowel gas pattern remains within the limits of normal. The feeding tube tip lies in the region of the duodenal bulb. Electronically Signed   By: David  Martinique M.D.   On: 09/11/2015 07:53   Dg Abd Portable 1v  09/10/2015  CLINICAL DATA:  Feeding tube placement EXAM: PORTABLE ABDOMEN - 1 VIEW COMPARISON:  Portable exam 1528 hours compared to 09/10/2015 at 1345 hours FINDINGS: Feeding tube traverses stomach with tip projecting over either the duodenal bulb or proximal descending duodenum. LVAD projects over upper abdomen. AICD lead projects over RIGHT ventricle. Visualized bowel gas pattern normal. IMPRESSION: Tip of feeding tube projects over either the duodenal bulb or proximal descending duodenum. Electronically Signed   By: Lavonia Dana M.D.   On: 09/10/2015 15:44   Dg Abd Portable 1v  09/10/2015  CLINICAL DATA:  Abdominal distention and emesis. EXAM: PORTABLE ABDOMEN - 1 VIEW COMPARISON:  One-view abdomen 09/04/2015. FINDINGS: A left ventricular assist device is in place. The bowel gas pattern is normal. Previously noted gastric distention has resolved. There is no obstruction or free air. IMPRESSION: Normal bowel gas pattern. Electronically Signed   By: San Morelle M.D.   On: 09/10/2015 13:58    Assessment/Plan: Diagnosis: debility related to C diff  and abscess associated with LVAD 1. Does the need for close, 24 hr/day medical supervision in concert with the patient's rehab needs make it unreasonable for this patient to be served in a less intensive setting? Yes/potentially 2. Co-Morbidities requiring supervision/potential complications: AKI, htn 3. Due to bladder management, bowel management, safety, skin/wound care, disease management, medication administration, pain management and patient education, does the patient require 24 hr/day rehab nursing? Yes 4. Does the patient require coordinated care of a physician, rehab nurse, PT (1-2 hrs/day, 5 days/week) and OT (1-2 hrs/day, 5 days/week) to address physical and functional deficits in the context  of the above medical diagnosis(es)? Yes/potentially Addressing deficits in the following areas: balance, endurance, locomotion, strength, transferring, bowel/bladder control, bathing, dressing, feeding, grooming, toileting and psychosocial support 5. Can the patient actively participate in an intensive therapy program of at least 3 hrs of therapy per day at least 5 days per week? Yes and Potentially 6. The potential for patient to make measurable gains while on inpatient rehab is excellent 7. Anticipated functional outcomes upon discharge from inpatient rehab are modified independent  with PT, modified independent with OT, n/a with SLP. 8. Estimated rehab length of stay to reach the above functional goals is: 7 days 9. Does the patient have adequate social supports and living environment to accommodate these discharge functional goals? Yes 10. Anticipated D/C setting: Home 11. Anticipated post D/C treatments: [place] therapy 12. Overall Rehab/Functional Prognosis: excellent  RECOMMENDATIONS: This patient's condition is appropriate for continued rehabilitative care in the following setting: CIR Patient has agreed to participate in recommended program. Yes Note that insurance prior authorization may be  required for reimbursement for recommended care.  Comment: Depending upon when he's medically stable to move to inpatient rehab, he could benefit from an inpatient rehab stay to reach Mod I goals. (lives alone)  Rehab Admissions Coordinator to follow up.  Thanks,  Meredith Staggers, MD, Mellody Drown     09/12/2015

## 2015-09-12 NOTE — Progress Notes (Signed)
3 Days Post-Op  Subjective: The patient is sleeping but stable.  Objective: Vital signs in last 24 hours: Temp:  [97.9 F (36.6 C)-98.5 F (36.9 C)] 98.1 F (36.7 C) (05/25 0400) Pulse Rate:  [79-88] 85 (05/25 0800) Resp:  [11-23] 17 (05/25 1100) BP: (90-135)/(69-95) 90/74 mmHg (05/25 1100) SpO2:  [97 %-100 %] 100 % (05/25 0900) Weight:  [83 kg (182 lb 15.7 oz)] 83 kg (182 lb 15.7 oz) (05/25 0500) Last BM Date: 09/11/15  Intake/Output from previous day: 05/24 0701 - 05/25 0700 In: 2553 [P.O.:640; I.V.:291; Blood:542; NG/GT:1080] Out: 2735 [Urine:235] Intake/Output this shift: Total I/O In: 120 [I.V.:30; NG/GT:90] Out: 25 [Urine:25]  Incision/Wound: The edges are improving and coming together.  Acell powder placed. Lab Results:   Recent Labs  09/12/15 0245 09/12/15 0930  WBC 16.5* 17.1*  HGB 9.9* 9.4*  HCT 28.8* 28.0*  PLT 182 178   BMET  Recent Labs  09/11/15 0820 09/12/15 0245  NA 136 135  K 3.2* 3.2*  CL 103 99*  CO2 22 27  GLUCOSE 88 92  BUN 50* 18  CREATININE 6.76* 3.50*  CALCIUM 8.1* 7.9*   PT/INR No results for input(s): LABPROT, INR in the last 72 hours. ABG No results for input(s): PHART, HCO3 in the last 72 hours.  Invalid input(s): PCO2, PO2  Studies/Results: Ct Abdomen Wo Contrast  09/11/2015  CLINICAL DATA:  Anemia, abdominal distention. EXAM: CT CHEST, ABDOMEN AND PELVIS WITHOUT CONTRAST TECHNIQUE: Multidetector CT imaging of the chest, abdomen and pelvis was performed following the standard protocol without IV contrast. COMPARISON:  CT scan of November 30, 2014. FINDINGS: CT CHEST No pneumothorax is noted. Mild bilateral pleural effusions are noted with adjacent subsegmental atelectasis. Multifocal densities are noted in both upper lobes concerning for edema or possibly inflammation. No significant osseous abnormality is noted in the chest. Mild cardiomegaly is noted. Patient is status post left ventricular assistance device placement.  Surgical wound in the epigastric region is noted containing debris which may represent inflammatory material. There does not appear to be a significant fluid collection in any deep location. Left subclavian pacemaker is noted. Mildly enlarged adenopathy is noted in the aortopulmonary window which is not significantly changed compared to prior exam and most likely reactive in etiology. Feeding tube is seen passing through the esophagus and stomach into the proximal duodenum. CT ABDOMEN AND PELVIS Status post cholecystectomy. No focal abnormality is noted in the liver, spleen or pancreas on these unenhanced images. Adrenal glands and kidneys appear normal. No hydronephrosis or renal obstruction is noted mild amount of free fluid is seen around the inferior portion of the liver. There is no evidence of bowel obstruction. The appendix appears normal. Urinary bladder is decompressed secondary to Foley catheter. Moderate amount of free fluid is noted in the pelvis with Hounsfield measurement of 11 consistent with fluid density. Mild anasarca is noted. Diverticulosis is noted throughout the colon without inflammation. Mildly enlarged bilateral inguinal lymph nodes are noted which most likely are inflammatory in etiology. IMPRESSION: Mild bilateral pleural effusions are noted with adjacent subsegmental atelectasis. Status post left ventricular assistance device placement. Open surgical wound in the epigastric region contains debris which may represent inflammatory material. Clinical correlation is recommended. No deep abscess or fluid collection is noted. Diverticulosis is noted throughout the colon without inflammation. Mild anasarca is noted. Mild amount of free fluid is noted around the liver, with moderate amount of free fluid seen in the pelvis. Mildly enlarged bilateral inguinal adenopathy is noted  most likely inflammatory in etiology. Electronically Signed   By: Marijo Conception, M.D.   On: 09/11/2015 10:10   Ct  Chest Wo Contrast  09/11/2015  CLINICAL DATA:  Anemia, abdominal distention. EXAM: CT CHEST, ABDOMEN AND PELVIS WITHOUT CONTRAST TECHNIQUE: Multidetector CT imaging of the chest, abdomen and pelvis was performed following the standard protocol without IV contrast. COMPARISON:  CT scan of November 30, 2014. FINDINGS: CT CHEST No pneumothorax is noted. Mild bilateral pleural effusions are noted with adjacent subsegmental atelectasis. Multifocal densities are noted in both upper lobes concerning for edema or possibly inflammation. No significant osseous abnormality is noted in the chest. Mild cardiomegaly is noted. Patient is status post left ventricular assistance device placement. Surgical wound in the epigastric region is noted containing debris which may represent inflammatory material. There does not appear to be a significant fluid collection in any deep location. Left subclavian pacemaker is noted. Mildly enlarged adenopathy is noted in the aortopulmonary window which is not significantly changed compared to prior exam and most likely reactive in etiology. Feeding tube is seen passing through the esophagus and stomach into the proximal duodenum. CT ABDOMEN AND PELVIS Status post cholecystectomy. No focal abnormality is noted in the liver, spleen or pancreas on these unenhanced images. Adrenal glands and kidneys appear normal. No hydronephrosis or renal obstruction is noted mild amount of free fluid is seen around the inferior portion of the liver. There is no evidence of bowel obstruction. The appendix appears normal. Urinary bladder is decompressed secondary to Foley catheter. Moderate amount of free fluid is noted in the pelvis with Hounsfield measurement of 11 consistent with fluid density. Mild anasarca is noted. Diverticulosis is noted throughout the colon without inflammation. Mildly enlarged bilateral inguinal lymph nodes are noted which most likely are inflammatory in etiology. IMPRESSION: Mild bilateral  pleural effusions are noted with adjacent subsegmental atelectasis. Status post left ventricular assistance device placement. Open surgical wound in the epigastric region contains debris which may represent inflammatory material. Clinical correlation is recommended. No deep abscess or fluid collection is noted. Diverticulosis is noted throughout the colon without inflammation. Mild anasarca is noted. Mild amount of free fluid is noted around the liver, with moderate amount of free fluid seen in the pelvis. Mildly enlarged bilateral inguinal adenopathy is noted most likely inflammatory in etiology. Electronically Signed   By: Marijo Conception, M.D.   On: 09/11/2015 10:10   Dg Chest Port 1 View  09/11/2015  CLINICAL DATA:  LEFT ventricular assist device EXAM: PORTABLE CHEST 1 VIEW COMPARISON:  Portable exam 0623 hours compared to 09/09/2015 FINDINGS: RIGHT subclavian central venous catheter tip projecting over cavoatrial junction. Feeding tube extends into stomach. LEFT ventricular assist device identified at the inferior margin of the exam. LEFT subclavian AICD with lead projecting over RIGHT ventricle. Enlargement of cardiac silhouette with pulmonary vascular congestion. Diffuse pulmonary infiltrates likely pulmonary edema. No pneumothorax. IMPRESSION: No interval change in pulmonary edema. Electronically Signed   By: Lavonia Dana M.D.   On: 09/11/2015 07:55   Dg Abd Portable 1v  09/11/2015  CLINICAL DATA:  Portable abdominal radiograph, emesis today. EXAM: PORTABLE ABDOMEN - 1 VIEW COMPARISON:  Abdominal radiograph of Sep 10, 2015 FINDINGS: There is mild gaseous distention of the stomach. The feeding tube tip appears to lie in the region of the duodenal bulb. The small and large bowel gas pattern is within the limits of normal. The left ventricular assist device where visualized is appropriately positioned. IMPRESSION: Increased volume of  gas within the stomach. The small and large bowel gas pattern remains  within the limits of normal. The feeding tube tip lies in the region of the duodenal bulb. Electronically Signed   By: David  Martinique M.D.   On: 09/11/2015 07:53   Dg Abd Portable 1v  09/10/2015  CLINICAL DATA:  Feeding tube placement EXAM: PORTABLE ABDOMEN - 1 VIEW COMPARISON:  Portable exam 1528 hours compared to 09/10/2015 at 1345 hours FINDINGS: Feeding tube traverses stomach with tip projecting over either the duodenal bulb or proximal descending duodenum. LVAD projects over upper abdomen. AICD lead projects over RIGHT ventricle. Visualized bowel gas pattern normal. IMPRESSION: Tip of feeding tube projects over either the duodenal bulb or proximal descending duodenum. Electronically Signed   By: Lavonia Dana M.D.   On: 09/10/2015 15:44   Dg Abd Portable 1v  09/10/2015  CLINICAL DATA:  Abdominal distention and emesis. EXAM: PORTABLE ABDOMEN - 1 VIEW COMPARISON:  One-view abdomen 09/04/2015. FINDINGS: A left ventricular assist device is in place. The bowel gas pattern is normal. Previously noted gastric distention has resolved. There is no obstruction or free air. IMPRESSION: Normal bowel gas pattern. Electronically Signed   By: San Morelle M.D.   On: 09/10/2015 13:58    Anti-infectives: Anti-infectives    Start     Dose/Rate Route Frequency Ordered Stop   09/11/15 0600  vancomycin (VANCOCIN) 50 mg/mL oral solution 125 mg     125 mg Oral Every 8 hours 09/10/15 2232     09/09/15 2000  ceFAZolin (ANCEF) IVPB 1 g/50 mL premix     1 g 100 mL/hr over 30 Minutes Intravenous Every 24 hours 09/09/15 1239     09/09/15 1011  polymyxin B 500,000 Units, bacitracin 50,000 Units in sodium chloride irrigation 0.9 % 500 mL irrigation  Status:  Discontinued       As needed 09/09/15 1012 09/09/15 1047   09/09/15 0800  ceFAZolin (ANCEF) IVPB 1 g/50 mL premix  Status:  Discontinued     1 g 100 mL/hr over 30 Minutes Intravenous Every 8 hours 09/09/15 0759 09/09/15 1239   09/09/15 0600  ceFAZolin (ANCEF)  IVPB 2g/100 mL premix  Status:  Discontinued     2 g 200 mL/hr over 30 Minutes Intravenous To ShortStay Surgical 09/08/15 1415 09/09/15 1049   09/05/15 1426  polymyxin B 500,000 Units, bacitracin 50,000 Units in sodium chloride irrigation 0.9 % 500 mL irrigation  Status:  Discontinued       As needed 09/05/15 1434 09/05/15 1513   09/05/15 1100  cefTRIAXone (ROCEPHIN) 2 g in dextrose 5 % 50 mL IVPB  Status:  Discontinued     2 g 100 mL/hr over 30 Minutes Intravenous Every 24 hours 09/05/15 0948 09/09/15 1238   09/05/15 1045  ceFAZolin (ANCEF) IVPB 2g/100 mL premix     2 g 200 mL/hr over 30 Minutes Intravenous To ShortStay Surgical 09/04/15 2206 09/05/15 1345   09/04/15 1000  imipenem-cilastatin (PRIMAXIN) 500 mg in sodium chloride 0.9 % 100 mL IVPB  Status:  Discontinued     500 mg 200 mL/hr over 30 Minutes Intravenous Every 12 hours 09/03/15 1817 09/05/15 0948   09/04/15 0600  vancomycin (VANCOCIN) IVPB 1000 mg/200 mL premix  Status:  Discontinued     1,000 mg 200 mL/hr over 60 Minutes Intravenous Every 24 hours 09/03/15 0757 09/03/15 0953   09/03/15 1518  vancomycin (VANCOCIN) 1,000 mg in sodium chloride 0.9 % 1,000 mL irrigation  Status:  Discontinued  As needed 09/03/15 1518 09/03/15 1525   09/03/15 1330  vancomycin (VANCOCIN) 1,000 mg in sodium chloride 0.9 % 1,000 mL irrigation  Status:  Discontinued      Irrigation To Surgery 09/03/15 1325 09/03/15 1628   09/03/15 1200  imipenem-cilastatin (PRIMAXIN) 250 mg in sodium chloride 0.9 % 100 mL IVPB  Status:  Discontinued     250 mg 200 mL/hr over 30 Minutes Intravenous Every 6 hours 09/03/15 0749 09/03/15 1817   09/03/15 0200  vancomycin (VANCOCIN) IVPB 750 mg/150 ml premix  Status:  Discontinued     750 mg 150 mL/hr over 60 Minutes Intravenous Every 12 hours 09/02/15 1234 09/03/15 0745   09/02/15 1400  vancomycin (VANCOCIN) IVPB 1000 mg/200 mL premix     1,000 mg 200 mL/hr over 60 Minutes Intravenous  Once 09/02/15 1234 09/02/15  1430   09/02/15 0900  imipenem-cilastatin (PRIMAXIN) 500 mg in sodium chloride 0.9 % 100 mL IVPB  Status:  Discontinued     500 mg 200 mL/hr over 30 Minutes Intravenous Every 8 hours 09/02/15 0814 09/03/15 0749   08/30/15 0400  vancomycin (VANCOCIN) 1,250 mg in sodium chloride 0.9 % 250 mL IVPB  Status:  Discontinued     1,250 mg 166.7 mL/hr over 90 Minutes Intravenous To Surgery 08/29/15 0611 08/29/15 1012   08/30/15 0400  cefUROXime (ZINACEF) 1.5 g in dextrose 5 % 50 mL IVPB  Status:  Discontinued     1.5 g 100 mL/hr over 30 Minutes Intravenous To Surgery 08/29/15 0611 08/29/15 1012   08/30/15 0400  fluconazole (DIFLUCAN) IVPB 400 mg  Status:  Discontinued     400 mg 100 mL/hr over 120 Minutes Intravenous To Surgery 08/29/15 0611 08/29/15 1012   08/30/15 0400  rifampin (RIFADIN) 600 mg in sodium chloride 0.9 % 100 mL IVPB  Status:  Discontinued     600 mg 200 mL/hr over 30 Minutes Intravenous To Surgery 08/29/15 0611 08/29/15 1012   08/29/15 0845  vancomycin (VANCOCIN) 1,000 mg in sodium chloride 0.9 % 1,000 mL irrigation  Status:  Discontinued      Irrigation To Surgery 08/29/15 0834 08/29/15 1012   08/29/15 0615  cefUROXime (ZINACEF) 750 mg in dextrose 5 % 50 mL IVPB  Status:  Discontinued     750 mg 100 mL/hr over 30 Minutes Intravenous To Surgery 08/29/15 0609 08/29/15 1012   08/29/15 0615  vancomycin (VANCOCIN) powder 1,000 mg     1,000 mg Other To Surgery 08/29/15 0609 08/29/15 0900   08/27/15 2300  vancomycin (VANCOCIN) 50 mg/mL oral solution 125 mg  Status:  Discontinued     125 mg Oral Every 6 hours 08/26/15 2200 08/27/15 0807   08/27/15 1601  vancomycin (VANCOCIN) 1,000 mg in sodium chloride 0.9 % 1,000 mL irrigation  Status:  Discontinued       As needed 08/27/15 1601 08/27/15 1740   08/27/15 1500  vancomycin (VANCOCIN) 1,000 mg in sodium chloride 0.9 % 1,000 mL irrigation  Status:  Discontinued      Irrigation To Surgery 08/27/15 1449 08/27/15 1748   08/27/15 1400  rifampin  (RIFADIN) capsule 300 mg     300 mg Oral Every 8 hours 08/27/15 1141     08/27/15 1100  vancomycin (VANCOCIN) 50 mg/mL oral solution 125 mg     125 mg Oral 4 times daily 08/27/15 1052 09/10/15 0959   08/26/15 2145  nafcillin 2 g in dextrose 5 % 100 mL IVPB  Status:  Discontinued     2  g 200 mL/hr over 30 Minutes Intravenous Every 4 hours 08/26/15 2104 09/02/15 0850      Assessment/Plan: s/p Procedure(s): DEBRIDEMENT AND CLOSURE WOUND WITH PLACEMENT OF ABRA CLOSURE DEVICE (N/A) continue daily dressing change with KY gel and gauze.  LOS: 17 days    Wallace Going 09/12/2015

## 2015-09-12 NOTE — Progress Notes (Signed)
Rehab admissions - i am following for potential acute inpatient rehab admission once patient is medically ready.  We will need [facility] insurance authorization prior to inpatient rehab admission.  Call me for questions.  RC:[id number]

## 2015-09-12 NOTE — Progress Notes (Signed)
CSW attempted to visit with patient at bedside but sound asleep. CSW will continue to provide supportive intervention as needed. Raquel Sarna, LCSW [phone]

## 2015-09-13 ENCOUNTER — Inpatient Hospital Stay (HOSPITAL_COMMUNITY): Payer: 59

## 2015-09-13 LAB — COMPREHENSIVE METABOLIC PANEL
ALT: 37 U/L (ref 17–63)
AST: 95 U/L — ABNORMAL HIGH (ref 15–41)
Albumin: 1.5 g/dL — ABNORMAL LOW (ref 3.5–5.0)
Alkaline Phosphatase: 136 U/L — ABNORMAL HIGH (ref 38–126)
Anion gap: 9 (ref 5–15)
BUN: 38 mg/dL — ABNORMAL HIGH (ref 6–20)
CO2: 26 mmol/L (ref 22–32)
Calcium: 8.2 mg/dL — ABNORMAL LOW (ref 8.9–10.3)
Chloride: 100 mmol/L — ABNORMAL LOW (ref 101–111)
Creatinine, Ser: 5.63 mg/dL — ABNORMAL HIGH (ref 0.61–1.24)
GFR calc Af Amer: 13 mL/min — ABNORMAL LOW (ref >60)
GFR calc non Af Amer: 11 mL/min — ABNORMAL LOW (ref >60)
Glucose, Bld: 108 mg/dL — ABNORMAL HIGH (ref 65–99)
Potassium: 3.5 mmol/L (ref 3.5–5.1)
Sodium: 135 mmol/L (ref 135–145)
Total Bilirubin: 9.5 mg/dL — ABNORMAL HIGH (ref 0.3–1.2)
Total Protein: 5.9 g/dL — ABNORMAL LOW (ref 6.5–8.1)

## 2015-09-13 LAB — CBC
HCT: 26.7 % — ABNORMAL LOW (ref 39.0–52.0)
Hemoglobin: 8.9 g/dL — ABNORMAL LOW (ref 13.0–17.0)
MCH: 26.6 pg (ref 26.0–34.0)
MCHC: 33.3 g/dL (ref 30.0–36.0)
MCV: 79.9 fL (ref 78.0–100.0)
Platelets: 180 10*3/uL (ref 150–400)
RBC: 3.34 MIL/uL — ABNORMAL LOW (ref 4.22–5.81)
RDW: 20.7 % — ABNORMAL HIGH (ref 11.5–15.5)
WBC: 19.2 10*3/uL — ABNORMAL HIGH (ref 4.0–10.5)

## 2015-09-13 LAB — GLUCOSE, CAPILLARY
GLUCOSE-CAPILLARY: 66 mg/dL (ref 65–99)
GLUCOSE-CAPILLARY: 66 mg/dL (ref 65–99)
GLUCOSE-CAPILLARY: 77 mg/dL (ref 65–99)
Glucose-Capillary: 61 mg/dL — ABNORMAL LOW (ref 65–99)
Glucose-Capillary: 73 mg/dL (ref 65–99)
Glucose-Capillary: 160 mg/dL — ABNORMAL HIGH (ref 65–99)
Glucose-Capillary: 86 mg/dL (ref 65–99)

## 2015-09-13 LAB — POCT I-STAT, CHEM 8
BUN: 16 mg/dL (ref 6–20)
CALCIUM ION: 1.07 mmol/L — AB (ref 1.12–1.23)
CHLORIDE: 100 mmol/L — AB (ref 101–111)
CREATININE: 2.7 mg/dL — AB (ref 0.61–1.24)
Glucose, Bld: 173 mg/dL — ABNORMAL HIGH (ref 65–99)
HCT: 28 % — ABNORMAL LOW (ref 39.0–52.0)
Hemoglobin: 9.5 g/dL — ABNORMAL LOW (ref 13.0–17.0)
Potassium: 3.6 mmol/L (ref 3.5–5.1)
SODIUM: 140 mmol/L (ref 135–145)
TCO2: 26 mmol/L (ref 0–100)

## 2015-09-13 LAB — CARBOXYHEMOGLOBIN
Carboxyhemoglobin: 2.5 % — ABNORMAL HIGH (ref 0.5–1.5)
Methemoglobin: 1.4 % (ref 0.0–1.5)
O2 SAT: 62.6 %
Total hemoglobin: 9.4 g/dL — ABNORMAL LOW (ref 13.5–18.0)

## 2015-09-13 LAB — HEPARIN LEVEL (UNFRACTIONATED): Heparin Unfractionated: <0.1 IU/mL — ABNORMAL LOW (ref 0.30–0.70)

## 2015-09-13 LAB — LACTATE DEHYDROGENASE: LDH: 259 U/L — AB (ref 98–192)

## 2015-09-13 MED ORDER — DEXTROSE 50 % IV SOLN
INTRAVENOUS | Status: AC
  Administered 2015-09-13: 25 mL
  Filled 2015-09-13: qty 50

## 2015-09-13 MED ORDER — DEXTROSE-NACL 5-0.9 % IV SOLN
INTRAVENOUS | Status: DC
  Administered 2015-09-13: 09:00:00 via INTRAVENOUS
  Administered 2015-09-15: 30 mL/h via INTRAVENOUS

## 2015-09-13 NOTE — Progress Notes (Signed)
Hypoglycemic Event  CBG:61  Treatment: D50 IV 25 mL  Symptoms: None  Follow-up CBG: [id number] CBG Result:127  Possible Reasons for Event: Inadequate meal intake  Comments/MD notified:NA used protocol    Samule Ohm

## 2015-09-13 NOTE — Progress Notes (Signed)
Patient ID: [name], male   DOB: [birth year], 45 y.o.   MRN: [id number]  [facility] Progress Note   Assessment/ Plan:   1. AKI Suspected to be ATN from sepsis/shock. Oliguric overnight (660 mL urine output) and on dialysis at this time primarily for clearance (conservative ultrafiltration goal of 1 L) I anticipate to monitor him over the weekend for acute dialysis needs-hopefully, we should continue to see improvement of his urine output and some renal recovery. 2. LVAD MSSA driveline infection/infected VAD pocket: On intravenous antibiotic therapy and now status post debridement and initial step towards wound closure. On oral rifampin.. 3. Clostridium difficile colitis: On oral vancomycin, reports significant clinical improvement. 4. Hypokalemia: Likely from GI/dialysis associated losses-status post total replacement yesterday-on 4K dialysis bath 5. Nonischemic cardiomyopathy/chronic systolic heart failure: supported with LVAD.  Subjective:   Denies any acute events overnight-currently reports to be comfortable on dialysis.    Objective:   BP 118/89 mmHg  Pulse 79  Temp(Src) 97.7 F (36.5 C) (Oral)  Resp 15  Ht 5\' 7"  (1.702 m)  Wt 82 kg (180 lb 12.4 oz)  BMI 28.31 kg/m2  SpO2 100%  Intake/Output Summary (Last 24 hours) at 09/13/15 0836 Last data filed at 09/13/15 0800  Gross per 24 hour  Intake    947 ml  Output    695 ml  Net    252 ml   Weight change: 0.2 kg (7.1 oz)  Physical Exam: [id number] resting in bed, awaiting wound dressing change CVS: Pulse regular rhythm, normal rate Resp: Poor inspiratory effort-decreased breath sounds bilaterally Abd: Slightly tender, distended and firm Ext: Trace lower extremity edema  Imaging: Ct Abdomen Wo Contrast  09/11/2015  CLINICAL DATA:  Anemia, abdominal distention. EXAM: CT CHEST, ABDOMEN AND PELVIS WITHOUT CONTRAST TECHNIQUE: Multidetector CT imaging of the chest, abdomen and pelvis was performed  following the standard protocol without IV contrast. COMPARISON:  CT scan of November 30, 2014. FINDINGS: CT CHEST No pneumothorax is noted. Mild bilateral pleural effusions are noted with adjacent subsegmental atelectasis. Multifocal densities are noted in both upper lobes concerning for edema or possibly inflammation. No significant osseous abnormality is noted in the chest. Mild cardiomegaly is noted. Patient is status post left ventricular assistance device placement. Surgical wound in the epigastric region is noted containing debris which may represent inflammatory material. There does not appear to be a significant fluid collection in any deep location. Left subclavian pacemaker is noted. Mildly enlarged adenopathy is noted in the aortopulmonary window which is not significantly changed compared to prior exam and most likely reactive in etiology. Feeding tube is seen passing through the esophagus and stomach into the proximal duodenum. CT ABDOMEN AND PELVIS Status post cholecystectomy. No focal abnormality is noted in the liver, spleen or pancreas on these unenhanced images. Adrenal glands and kidneys appear normal. No hydronephrosis or renal obstruction is noted mild amount of free fluid is seen around the inferior portion of the liver. There is no evidence of bowel obstruction. The appendix appears normal. Urinary bladder is decompressed secondary to Foley catheter. Moderate amount of free fluid is noted in the pelvis with Hounsfield measurement of 11 consistent with fluid density. Mild anasarca is noted. Diverticulosis is noted throughout the colon without inflammation. Mildly enlarged bilateral inguinal lymph nodes are noted which most likely are inflammatory in etiology. IMPRESSION: Mild bilateral pleural effusions are noted with adjacent subsegmental atelectasis. Status post left ventricular assistance device placement. Open surgical wound in the epigastric region contains  debris which may represent  inflammatory material. Clinical correlation is recommended. No deep abscess or fluid collection is noted. Diverticulosis is noted throughout the colon without inflammation. Mild anasarca is noted. Mild amount of free fluid is noted around the liver, with moderate amount of free fluid seen in the pelvis. Mildly enlarged bilateral inguinal adenopathy is noted most likely inflammatory in etiology. Electronically Signed   By: Marijo Conception, M.D.   On: 09/11/2015 10:10   Ct Chest Wo Contrast  09/11/2015  CLINICAL DATA:  Anemia, abdominal distention. EXAM: CT CHEST, ABDOMEN AND PELVIS WITHOUT CONTRAST TECHNIQUE: Multidetector CT imaging of the chest, abdomen and pelvis was performed following the standard protocol without IV contrast. COMPARISON:  CT scan of November 30, 2014. FINDINGS: CT CHEST No pneumothorax is noted. Mild bilateral pleural effusions are noted with adjacent subsegmental atelectasis. Multifocal densities are noted in both upper lobes concerning for edema or possibly inflammation. No significant osseous abnormality is noted in the chest. Mild cardiomegaly is noted. Patient is status post left ventricular assistance device placement. Surgical wound in the epigastric region is noted containing debris which may represent inflammatory material. There does not appear to be a significant fluid collection in any deep location. Left subclavian pacemaker is noted. Mildly enlarged adenopathy is noted in the aortopulmonary window which is not significantly changed compared to prior exam and most likely reactive in etiology. Feeding tube is seen passing through the esophagus and stomach into the proximal duodenum. CT ABDOMEN AND PELVIS Status post cholecystectomy. No focal abnormality is noted in the liver, spleen or pancreas on these unenhanced images. Adrenal glands and kidneys appear normal. No hydronephrosis or renal obstruction is noted mild amount of free fluid is seen around the inferior portion of the  liver. There is no evidence of bowel obstruction. The appendix appears normal. Urinary bladder is decompressed secondary to Foley catheter. Moderate amount of free fluid is noted in the pelvis with Hounsfield measurement of 11 consistent with fluid density. Mild anasarca is noted. Diverticulosis is noted throughout the colon without inflammation. Mildly enlarged bilateral inguinal lymph nodes are noted which most likely are inflammatory in etiology. IMPRESSION: Mild bilateral pleural effusions are noted with adjacent subsegmental atelectasis. Status post left ventricular assistance device placement. Open surgical wound in the epigastric region contains debris which may represent inflammatory material. Clinical correlation is recommended. No deep abscess or fluid collection is noted. Diverticulosis is noted throughout the colon without inflammation. Mild anasarca is noted. Mild amount of free fluid is noted around the liver, with moderate amount of free fluid seen in the pelvis. Mildly enlarged bilateral inguinal adenopathy is noted most likely inflammatory in etiology. Electronically Signed   By: Marijo Conception, M.D.   On: 09/11/2015 10:10   Dg Chest Port 1 View  09/13/2015  CLINICAL DATA:  Left ventricular assist device EXAM: PORTABLE CHEST 1 VIEW COMPARISON:  09/11/2015 FINDINGS: Support devices in stable position. Mild cardiomegaly. Improving pulmonary edema pattern with mild residual interstitial edema. No confluent opacities or effusions. IMPRESSION: Improving pulmonary edema/ CHF pattern. Electronically Signed   By: Rolm Baptise M.D.   On: 09/13/2015 07:51   Dg Abd Portable 1v  09/13/2015  CLINICAL DATA:  Ileus EXAM: PORTABLE ABDOMEN - 1 VIEW COMPARISON:  08/22/2015; 09/10/2015; CT abdomen pelvis - 09/11/2015 FINDINGS: Weighted tip enteric tube projects over the expected location of the ligament Treitz. There is mild apparent wall wall thickening involving several loops of small bowel within the left  mid hemi abdomen.  No definite evidence of enteric obstruction. No supine evidence of pneumoperitoneum. No pneumatosis or portal venous gas. LVAD device overlies the midline of the upper abdomen. Post median sternotomy. Post cholecystectomy. Additional radiopaque support apparatus overlies the right upper abdominal quadrant and midline of the lower chest. No acute osseus abnormalities. IMPRESSION: 1. Weighted tip enteric tube projects over the expected location of ligamentum Treitz. 2. Apparent mild bowel wall thickening involving several loops of small bowel within the left mid hemi abdomen. No definite evidence of enteric obstruction. Electronically Signed   By: Sandi Mariscal M.D.   On: 09/13/2015 07:51    Labs: BMET  Recent Labs Lab 09/08/15 1201  09/10/15 0155 09/10/15 1025 09/10/15 1250 09/10/15 1526 09/11/15 0515 09/11/15 0820 09/12/15 0245 09/13/15 0455  NA 135  < > 136  --  135 136 141 136 135 135  K 3.0*  < > 3.0* 3.3* 3.4* 5.5* <2.0* 3.2* 3.2* 3.5  CL 102  < > 100*  --  104 102 119* 103 99* 100*  CO2 22  --  26  --  25  --  16* 22 27 26   GLUCOSE 105*  < > 101*  --  81 72 84 88 92 108*  BUN 53*  < > 32*  --  39* 37* 31* 50* 18 38*  CREATININE 7.03*  < > 5.54*  --  5.71* 6.10* 4.02* 6.76* 3.50* 5.63*  CALCIUM 8.5*  --  8.1*  --  7.8*  --  4.9* 8.1* 7.9* 8.2*  PHOS 5.8*  --   --   --   --   --   --   --   --   --   < > = values in this interval not displayed. CBC  Recent Labs Lab 09/11/15 1300 09/12/15 0245 09/12/15 0930 09/13/15 0455  WBC 17.5* 16.5* 17.1* 19.2*  HGB 9.7* 9.9* 9.4* 8.9*  HCT 28.7* 28.8* 28.0* 26.7*  MCV 79.1 78.7 78.9 79.9  PLT 153 182 178 180    Medications:    . sodium chloride   Intravenous Once  . allopurinol  100 mg Oral Daily  . amiodarone  100 mg Oral Daily  .  ceFAZolin (ANCEF) IV  1 g Intravenous Q24H  . citalopram  20 mg Oral Daily  . docusate sodium  200 mg Oral Daily  . feeding supplement (PRO-STAT SUGAR FREE 64)  30 mL Per Tube TID   . ferrous sulfate  325 mg Oral BID WC  . guaiFENesin  600 mg Oral BID  . magic mouthwash  10 mL Oral TID  . metoCLOPramide (REGLAN) injection  5 mg Intravenous Q6H  . pantoprazole (PROTONIX) IV  40 mg Intravenous Q12H  . pregabalin  75 mg Oral Daily  . rifampin  300 mg Oral Q8H  . sildenafil  20 mg Oral Q8H  . simethicone  80 mg Oral QID  . sodium chloride flush  10-40 mL Intracatheter Q12H  . sodium chloride flush  3 mL Intravenous Q12H  . tamsulosin  0.4 mg Oral Daily  . vancomycin  125 mg Oral Q8H   Elmarie Shiley, MD 09/13/2015, 8:36 AM

## 2015-09-13 NOTE — Procedures (Signed)
Patient seen on Hemodialysis. QB 350, UF goal 1L Treatment adjusted as needed.  Elmarie Shiley MD [facility]. Office # [phone] Pager # [phone] 8:39 AM

## 2015-09-13 NOTE — Progress Notes (Signed)
Physical Therapy Treatment Patient Details Name: [name] MRN: [id number] DOB: December 01, 1970 Today's Date: 09/13/2015    History of Present Illness [name] is a 45 y.o. 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 TEE on 5/11 negative 5/22 DEBRIDEMENT AND CLOSURE WOUND WITH PLACEMENT OF ABRA CLOSURE DEVICE     PT Comments    Pt admitted with above diagnosis. Pt currently with functional limitations due to balance and endurance deficits. Pt was able to ambulate with 2 person assist but only min assist with third person following with chair.  Less physical assist given today with more steadying assist.  Progressing.   Pt will benefit from skilled PT to increase their independence and safety with mobility to allow discharge to the venue listed below.    Follow Up Recommendations  Supervision/Assistance - 24 hour;CIR     Equipment Recommendations  Other (comment) (TBA)    Recommendations for Other Services Rehab consult     Precautions / Restrictions Precautions Precautions: Fall Restrictions Weight Bearing Restrictions: No    Mobility  Bed Mobility Overal bed mobility: Needs Assistance;+2 for physical assistance Bed Mobility: Rolling;Sidelying to Sit Rolling: Min assist;+2 for safety/equipment Sidelying to sit: Min assist;+2 for physical assistance;HOB elevated       General bed mobility comments: rolling to protect incision; pt with good effort to push to sitting  Transfers Overall transfer level: Needs assistance Equipment used: Rolling walker (2 wheeled) Transfers: Sit to/from Stand Sit to Stand: Min assist;+2 physical assistance;+2 safety/equipment         General transfer comment: assist slightly to power up.  Ambulation/Gait Ambulation/Gait assistance: Min assist;+2 physical  assistance;+2 safety/equipment Ambulation Distance (Feet): 225 Feet (50 feet then 75 feet then 100 feet) Assistive device: Rolling walker (2 wheeled) Gait Pattern/deviations: Step-through pattern;Decreased stride length;Trunk flexed;Wide base of support   Gait velocity interpretation: Below normal speed for age/gender General Gait Details: Needed several sitting rest breaks.  Followed pt with chair.  Pt needing mostly cues only and steadying assist.    Stairs            Wheelchair Mobility    Modified Rankin (Stroke Patients Only)       Balance Overall balance assessment: Needs assistance Sitting-balance support: No upper extremity supported;Feet supported Sitting balance-Leahy Scale: Fair Sitting balance - Comments: Pt sat 10 min EOB with min guard assist.  Pt instructed OT in manipulation of LVAD equipment needing some cues for remembering some equipment (clips).  Pt also seemed to have delay in recalling how to switch from power source to battery for first attempt.  Asked nursing to walk pt this weekend and for him to practice with LVAD equipment.    Standing balance support: Bilateral upper extremity supported;During functional activity Standing balance-Leahy Scale: Poor Standing balance comment: relieson UEs on RW for stability.                     Cognition Arousal/Alertness: Awake/alert Behavior During Therapy: Flat affect Overall Cognitive Status: Within Functional Limits for tasks assessed                      Exercises      General Comments        Pertinent Vitals/Pain Pain Assessment: No/denies pain Faces Pain Scale: Hurts little more Pain Location: abdomen Pain Descriptors / Indicators:  Aching;Guarding;Grimacing Pain Intervention(s): Limited activity within patient's tolerance;Monitored during session;Repositioned  VSS    Home Living                      Prior Function            PT Goals (current goals can now be found  in the care plan section) Progress towards PT goals: Progressing toward goals    Frequency  Min 3X/week    PT Plan Current plan remains appropriate    Co-evaluation PT/OT/SLP Co-Evaluation/Treatment: Yes Reason for Co-Treatment: Complexity of the patient's impairments (multi-system involvement);For patient/therapist safety PT goals addressed during session: Mobility/safety with mobility       End of Session Equipment Utilized During Treatment: Gait belt Activity Tolerance: Patient limited by fatigue Patient left: with call bell/phone within reach;with nursing/sitter in room;in chair     Time: [id number] PT Time Calculation (min) (ACUTE ONLY): 38 min  Charges:  $Gait Training: 8-22 mins                    G Codes:      WhiteGodfrey Pick 10-10-2015, 3:50 PM [facility] [facility] [phone] (pager)

## 2015-09-13 NOTE — Evaluation (Signed)
Occupational Therapy Evaluation Patient Details Name: [name] MRN: [id number] DOB: [birth year] Today's Date: 09/13/2015    History of Present Illness [name] is a 45 y.o. 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 TEE on 5/11 negative 5/22 DEBRIDEMENT AND CLOSURE WOUND WITH PLACEMENT OF ABRA CLOSURE DEVICE    Clinical Impression   Pt was independent prior to admission. Pt presents with abdominal pain, generalized weakness, poor activity tolerance and decreased balance. Needing cues to manage LVAD vest and equipment. Requiring 2 person assist for ambulation and needing frequent rest breaks with activity. Recommending inpatient rehab to prepare pt to return home with the assistance of his siblings. Will follow.    Follow Up Recommendations  CIR;Supervision/Assistance - 24 hour    Equipment Recommendations  None recommended by OT    Recommendations for Other Services       Precautions / Restrictions Precautions Precautions: Fall Restrictions Weight Bearing Restrictions: No      Mobility Bed Mobility Overal bed mobility: Needs Assistance Bed Mobility: Rolling;Sidelying to Sit Rolling: Min assist Sidelying to sit: Min assist;HOB elevated       General bed mobility comments: used log roll technique, min assist to elevate trunk  Transfers Overall transfer level: Needs assistance Equipment used: Rolling walker (2 wheeled) Transfers: Sit to/from Stand Sit to Stand: Min assist;+2 physical assistance;+2 safety/equipment         General transfer comment: assist slightly to power up.    Balance Overall balance assessment: Needs assistance Sitting-balance support: No upper extremity supported;Feet supported Sitting balance-Leahy Scale: Fair Sitting balance - Comments: Pt sat 10 min EOB  with min guard assist.  Pt instructed OT in manipulation of LVAD equipment needing some cues for remembering some equipment (clips).  Pt also seemed to have delay in recalling how to switch from power source to battery for first attempt.  Asked nursing to walk pt this weekend and for him to practice with LVAD equipment.    Standing balance support: Bilateral upper extremity supported;During functional activity Standing balance-Leahy Scale: Poor Standing balance comment: relieson UEs on RW for stability.                             ADL Overall ADL's : Needs assistance/impaired Eating/Feeding: Set up;Sitting   Grooming: Set up;Sitting   Upper Body Bathing: Minimal assitance;Sitting   Lower Body Bathing: Minimal assistance;Sit to/from stand   Upper Body Dressing : Moderate assistance;Sitting Upper Body Dressing Details (indicate cue type and reason): vest and front opening gown Lower Body Dressing: Minimal assistance;Sit to/from stand Lower Body Dressing Details (indicate cue type and reason): pt able to cross foot over opposite knee to don socks Toilet Transfer: Minimal assistance;+2 for physical assistance;Ambulation;RW   Toileting- Clothing Manipulation and Hygiene: Minimal assistance;Sit to/from stand       Functional mobility during ADLs: +2 for physical assistance;Minimal assistance;Rolling walker       Vision     Perception     Praxis      Pertinent Vitals/Pain Pain Assessment: Faces Faces Pain Scale: Hurts little more Pain Location: abdomen Pain Descriptors / Indicators: Aching;Sore;Guarding;Grimacing Pain Intervention(s): Monitored during session;Limited activity within patient's tolerance;Repositioned     Hand Dominance Right   Extremity/Trunk Assessment Upper Extremity Assessment Upper Extremity Assessment: Generalized weakness   Lower Extremity Assessment Lower  Extremity Assessment: Defer to PT evaluation   Cervical / Trunk  Assessment Cervical / Trunk Assessment: Normal   Communication Communication Communication: No difficulties   Cognition Arousal/Alertness: Awake/alert Behavior During Therapy: Flat affect (poor eye contact) Overall Cognitive Status: Within Functional Limits for tasks assessed                     General Comments       Exercises       Shoulder Instructions      Home Living Family/patient expects to be discharged to:: Private residence Living Arrangements: Alone Available Help at Discharge: Family;Available 24 hours/day (siblings will assist) Type of Home: House Home Access: Level entry     Home Layout: One level     Bathroom Shower/Tub: Teacher, early years/pre: Standard     Home Equipment: None          Prior Functioning/Environment Level of Independence: Independent        Comments: Driving    OT Diagnosis: Generalized weakness;Acute pain   OT Problem List: Decreased strength;Decreased activity tolerance;Impaired balance (sitting and/or standing);Decreased knowledge of use of DME or AE;Pain;Decreased safety awareness   OT Treatment/Interventions: Self-care/ADL training;DME and/or AE instruction;Balance training;Patient/family education;Therapeutic activities    OT Goals(Current goals can be found in the care plan section) Acute Rehab OT Goals Patient Stated Goal: to get better OT Goal Formulation: With patient Time For Goal Achievement: 09/27/15 Potential to Achieve Goals: Good ADL Goals Pt Will Perform Grooming: with supervision;standing Pt Will Perform Upper Body Bathing: with set-up;sitting;with supervision Pt Will Perform Lower Body Bathing: with set-up;with supervision;sit to/from stand Pt Will Perform Upper Body Dressing: with set-up;with supervision;sitting Pt Will Perform Lower Body Dressing: with set-up;with supervision;sit to/from stand Pt Will Transfer to Toilet: with set-up;with supervision;ambulating;regular height  toilet Pt Will Perform Toileting - Clothing Manipulation and hygiene: with set-up;with supervision;sit to/from stand Pt/caregiver will Perform Home Exercise Program: Increased strength;Both right and left upper extremity;With theraband (level 3) Additional ADL Goal #1: Pt will perform management of LVAD equipment and vest independently.  OT Frequency: Min 2X/week   Barriers to D/C:            Co-evaluation PT/OT/SLP Co-Evaluation/Treatment: Yes Reason for Co-Treatment: For patient/therapist safety;Complexity of the patient's impairments (multi-system involvement) PT goals addressed during session: Mobility/safety with mobility OT goals addressed during session: ADL's and self-care      End of Session Equipment Utilized During Treatment: Rolling walker  Activity Tolerance: Patient limited by fatigue Patient left: in bed;with call bell/phone within reach   Time: 1440-1515 OT Time Calculation (min): 35 min Charges:  OT General Charges $OT Visit: 1 Procedure OT Evaluation $OT Eval High Complexity: 1 Procedure G-Codes:    Malka So 09/13/2015, 4:07 PM [phone]

## 2015-09-13 NOTE — Progress Notes (Signed)
Patient ID: CALDWELL TRIANO, male   DOB: [birth year], 45 y.o.   MRN: [id number]    HeartMate 2 Rounding Note  Subjective:    Admitted from [facility] in [place] with driveline abscess. Blood cultures --> MSSA and has C diff. Remains on Nafcillin  and oral vancomycin.   08/27/15 S/P I &D Epigastric with hardware exposed and VAC placement.  5/11 repeat I&D and wound vac replacement.  5/11 TEE no vegetations on valves or ICD wire 5/22 S/P ABRA and Acell   Yesterday had an episode of hemoptysis. Tube feeds stopped. Hgb trending down 9.9>.8.9.  On HD today. Had BM last night. Making >20 cc urine per hour.   Denies SOB. No N/V over night.    VAD INTERROGATION:  HeartMate II LVAD:  Flow 4.3 liters/min, speed 9200, power 5.2   PI 7.7   PI events.   Objective:    Vital Signs:   Temp:  [98 F (36.7 C)-99.5 F (37.5 C)] 99.5 F (37.5 C) (05/26 0400) Pulse Rate:  [80-85] 82 (05/26 0600) Resp:  [13-23] 15 (05/26 0700) BP: (86-142)/(69-94) 126/89 mmHg (05/26 0700) SpO2:  [98 %-100 %] 100 % (05/26 0600) Weight:  [183 lb 6.8 oz (83.2 kg)] 183 lb 6.8 oz (83.2 kg) (05/26 0500) Last BM Date: 09/13/15 Mean arterial Pressure  90s   Intake/Output:   Intake/Output Summary (Last 24 hours) at 09/13/15 0717 Last data filed at 09/13/15 0600  Gross per 24 hour  Intake    982 ml  Output    660 ml  Net    322 ml     Physical Exam: CVP ~ 8 General: In bed. NAD HEENT: Scleral icterus Neck: supple.  JVP 7-8 Carotids 2+ bilat; no bruits. No lymphadenopathy or thryomegaly appreciated. R subclavian trialysis cath dressing dry Cor: Mechanical heart sounds with LVAD hum present. Lungs: clear on  Abdomen: soft, tender,  Less distended. + bowel sounds. Mid epigastric dressing ok.   Driveline: C/D/I; securement device intact and driveline incorporated Extremities: no cyanosis, clubbing, rash, 1+ edema R and LLE SCDs.  Neuro: A & O x3  Telemetry: NSR 80s   Labs: Basic Metabolic  Panel:  Recent Labs Lab 09/08/15 1201  09/10/15 0155  09/10/15 1250 09/10/15 1526 09/11/15 0515 09/11/15 0820 09/12/15 0245 09/13/15 0455  NA 135  < > 136  --  135 136 141 136 135 135  K 3.0*  < > 3.0*  < > 3.4* 5.5* <2.0* 3.2* 3.2* 3.5  CL 102  < > 100*  --  104 102 119* 103 99* 100*  CO2 22  --  26  --  25  --  16* 22 27 26   GLUCOSE 105*  < > 101*  --  81 72 84 88 92 108*  BUN 53*  < > 32*  --  39* 37* 31* 50* 18 38*  CREATININE 7.03*  < > 5.54*  --  5.71* 6.10* 4.02* 6.76* 3.50* 5.63*  CALCIUM 8.5*  --  8.1*  --  7.8*  --  4.9* 8.1* 7.9* 8.2*  MG  --   --  2.3  --   --   --   --  2.7*  --   --   PHOS 5.8*  --   --   --   --   --   --   --   --   --   < > = values in this interval not displayed.  Liver Function Tests:  Recent Labs Lab 09/07/15 0400 09/08/15 1201 09/10/15 0155 09/11/15 0515 09/12/15 0245 09/13/15 0455  AST 42*  --  61* 33 78* 95*  ALT 8*  --  15* 13* 25 37  ALKPHOS 92  --  112 78 168* 136*  BILITOT 8.5*  --  8.6* 5.3* 8.8* 9.5*  PROT 5.7*  --  5.7* 3.2* 6.2* 5.9*  ALBUMIN 1.5* 1.4* 1.5* <1.0* 1.6* 1.5*   No results for input(s): LIPASE, AMYLASE in the last 168 hours. No results for input(s): AMMONIA in the last 168 hours.  CBC:  Recent Labs Lab 09/11/15 0515 09/11/15 1300 09/12/15 0245 09/12/15 0930 09/13/15 0455  WBC 9.7 17.5* 16.5* 17.1* 19.2*  HGB 4.2* 9.7* 9.9* 9.4* 8.9*  HCT 12.3* 28.7* 28.8* 28.0* 26.7*  MCV 76.4* 79.1 78.7 78.9 79.9  PLT 94* 153 182 178 180    INR: No results for input(s): INR in the last 168 hours.  Other results:    Imaging: Ct Abdomen Wo Contrast  09/11/2015  CLINICAL DATA:  Anemia, abdominal distention. EXAM: CT CHEST, ABDOMEN AND PELVIS WITHOUT CONTRAST TECHNIQUE: Multidetector CT imaging of the chest, abdomen and pelvis was performed following the standard protocol without IV contrast. COMPARISON:  CT scan of November 30, 2014. FINDINGS: CT CHEST No pneumothorax is noted. Mild bilateral pleural  effusions are noted with adjacent subsegmental atelectasis. Multifocal densities are noted in both upper lobes concerning for edema or possibly inflammation. No significant osseous abnormality is noted in the chest. Mild cardiomegaly is noted. Patient is status post left ventricular assistance device placement. Surgical wound in the epigastric region is noted containing debris which may represent inflammatory material. There does not appear to be a significant fluid collection in any deep location. Left subclavian pacemaker is noted. Mildly enlarged adenopathy is noted in the aortopulmonary window which is not significantly changed compared to prior exam and most likely reactive in etiology. Feeding tube is seen passing through the esophagus and stomach into the proximal duodenum. CT ABDOMEN AND PELVIS Status post cholecystectomy. No focal abnormality is noted in the liver, spleen or pancreas on these unenhanced images. Adrenal glands and kidneys appear normal. No hydronephrosis or renal obstruction is noted mild amount of free fluid is seen around the inferior portion of the liver. There is no evidence of bowel obstruction. The appendix appears normal. Urinary bladder is decompressed secondary to Foley catheter. Moderate amount of free fluid is noted in the pelvis with Hounsfield measurement of 11 consistent with fluid density. Mild anasarca is noted. Diverticulosis is noted throughout the colon without inflammation. Mildly enlarged bilateral inguinal lymph nodes are noted which most likely are inflammatory in etiology. IMPRESSION: Mild bilateral pleural effusions are noted with adjacent subsegmental atelectasis. Status post left ventricular assistance device placement. Open surgical wound in the epigastric region contains debris which may represent inflammatory material. Clinical correlation is recommended. No deep abscess or fluid collection is noted. Diverticulosis is noted throughout the colon without  inflammation. Mild anasarca is noted. Mild amount of free fluid is noted around the liver, with moderate amount of free fluid seen in the pelvis. Mildly enlarged bilateral inguinal adenopathy is noted most likely inflammatory in etiology. Electronically Signed   By: Marijo Conception, M.D.   On: 09/11/2015 10:10   Ct Chest Wo Contrast  09/11/2015  CLINICAL DATA:  Anemia, abdominal distention. EXAM: CT CHEST, ABDOMEN AND PELVIS WITHOUT CONTRAST TECHNIQUE: Multidetector CT imaging of the chest, abdomen and pelvis was performed following the standard protocol without  IV contrast. COMPARISON:  CT scan of November 30, 2014. FINDINGS: CT CHEST No pneumothorax is noted. Mild bilateral pleural effusions are noted with adjacent subsegmental atelectasis. Multifocal densities are noted in both upper lobes concerning for edema or possibly inflammation. No significant osseous abnormality is noted in the chest. Mild cardiomegaly is noted. Patient is status post left ventricular assistance device placement. Surgical wound in the epigastric region is noted containing debris which may represent inflammatory material. There does not appear to be a significant fluid collection in any deep location. Left subclavian pacemaker is noted. Mildly enlarged adenopathy is noted in the aortopulmonary window which is not significantly changed compared to prior exam and most likely reactive in etiology. Feeding tube is seen passing through the esophagus and stomach into the proximal duodenum. CT ABDOMEN AND PELVIS Status post cholecystectomy. No focal abnormality is noted in the liver, spleen or pancreas on these unenhanced images. Adrenal glands and kidneys appear normal. No hydronephrosis or renal obstruction is noted mild amount of free fluid is seen around the inferior portion of the liver. There is no evidence of bowel obstruction. The appendix appears normal. Urinary bladder is decompressed secondary to Foley catheter. Moderate amount of  free fluid is noted in the pelvis with Hounsfield measurement of 11 consistent with fluid density. Mild anasarca is noted. Diverticulosis is noted throughout the colon without inflammation. Mildly enlarged bilateral inguinal lymph nodes are noted which most likely are inflammatory in etiology. IMPRESSION: Mild bilateral pleural effusions are noted with adjacent subsegmental atelectasis. Status post left ventricular assistance device placement. Open surgical wound in the epigastric region contains debris which may represent inflammatory material. Clinical correlation is recommended. No deep abscess or fluid collection is noted. Diverticulosis is noted throughout the colon without inflammation. Mild anasarca is noted. Mild amount of free fluid is noted around the liver, with moderate amount of free fluid seen in the pelvis. Mildly enlarged bilateral inguinal adenopathy is noted most likely inflammatory in etiology. Electronically Signed   By: Marijo Conception, M.D.   On: 09/11/2015 10:10     Medications:     Scheduled Medications: . sodium chloride   Intravenous Once  . allopurinol  100 mg Oral Daily  . amiodarone  100 mg Oral Daily  .  ceFAZolin (ANCEF) IV  1 g Intravenous Q24H  . citalopram  20 mg Oral Daily  . docusate sodium  200 mg Oral Daily  . feeding supplement (PRO-STAT SUGAR FREE 64)  30 mL Per Tube TID  . ferrous sulfate  325 mg Oral BID WC  . guaiFENesin  600 mg Oral BID  . magic mouthwash  10 mL Oral TID  . metoCLOPramide (REGLAN) injection  5 mg Intravenous Q6H  . pantoprazole (PROTONIX) IV  40 mg Intravenous Q12H  . pregabalin  75 mg Oral Daily  . rifampin  300 mg Oral Q8H  . sildenafil  20 mg Oral Q8H  . simethicone  80 mg Oral QID  . sodium chloride flush  10-40 mL Intracatheter Q12H  . sodium chloride flush  3 mL Intravenous Q12H  . tamsulosin  0.4 mg Oral Daily  . vancomycin  125 mg Oral Q8H    Infusions: . feeding supplement (NEPRO CARB STEADY) Stopped (09/12/15 0900)    . heparin 1,000 Units/hr (09/13/15 0400)    PRN Medications: sodium chloride, sodium chloride, sodium chloride, alteplase, bisacodyl **OR** bisacodyl, fentaNYL (SUBLIMAZE) injection, gelatin adsorbable, Gerhardt's butt cream, ondansetron, oxyCODONE, sodium chloride flush, sodium chloride flush, traMADol  Assessment:   1. LVAD Complication- Driveline abscess 2. MSSA bacteremia -> septic shock 3. Acute/Chronic systolic HF s/p VAD placement 9/16 4. C. Difficile colitis 5. 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 Acute blood loss anemia 8. Severe Malnutrition.   9.. ARF 10. Klebsiella (ESBL) pneumonia 11. NSVT  12. Hypokalemia  13. RV Failure   Plan/Discussion:    S/P I&D LVAD driveline abscess. Exposed hardware.   09/09/15 09/09/15  S/P ABRA and reapplication of ACell. Adjustments per Plastics. WBC  Trending up 19.2. Afebrile.   ID following. Now on ceftriaxone for klebsiella PNA and MSSA. Oral vanc for c.diff.   No ectopy overnight. K 3.5. CO-OX 63%  Has RV failure. Maps 80-90s. Continue 20 mg sildenafil.   Tolerating HD. Making some urine.   VAD parameters ok. Hgb this morning 9.9>8.9. On Heparin.   Off tube feeds with hemoptysis. No further N/V. Anticipate restarting today.   PT following. Recommending CIR. CIR following.     Length of Stay: [place] NP-C  09/13/2015, 7:17 AM  VAD Team --- VAD ISSUES ONLY--- Pager [phone] (7am - 7am)  Advanced Heart Failure Team  Pager [phone] (M-F; 7a - 4p)  Please contact [facility] for night-coverage after hours (4p -7a ) and weekends on [email or web address]  Patient seen and examined with Darrick Grinder, NP. We discussed all aspects of the encounter. I agree with the assessment and plan as stated above.   Tolerated HD well this am. Urine output continues to pick up. Volume status looks good.  Co-ox and MAP stable off inotropes.   Wound redressed this am. Granulating well.   Remains on abx  as above. WBC now trending up again. Will touch base with ID.   No further emesis. Tolerating TFs   Back on heparin. Hgb down slightly. Continue to follow.  VAD parameters are stable.   Kaidan Harpster,MD 3:03 PM

## 2015-09-13 NOTE — Progress Notes (Signed)
ANTICOAGULATION CONSULT NOTE - Follow Up Consult  Pharmacy Consult for Heparin Indication: LVAD  Allergies  Allergen Reactions  . Phytonadione Other (See Comments)    Patient has LVAD: please check with LVAD coordinator on call or LVAD MD on call before reversal of anticoagulation with vit k    Patient Measurements: Height: 5\' 7"  (170.2 cm) Weight: 180 lb 12.4 oz (82 kg) IBW/kg (Calculated) : 66.1 Heparin Dosing Weight:  85 kg  Vital Signs: Temp: 97.7 F (36.5 C) (05/26 0830) Temp Source: Oral (05/26 0830) BP: 134/86 mmHg (05/26 1000) Pulse Rate: 82 (05/26 1000)  Labs:  Recent Labs  09/11/15 0515 09/11/15 0820  09/12/15 0245 09/12/15 0930 09/12/15 1818 09/13/15 0455  HGB 4.2*  --   < > 9.9* 9.4*  --  8.9*  HCT 12.3*  --   < > 28.8* 28.0*  --  26.7*  PLT 94*  --   < > 182 178  --  180  HEPARINUNFRC <0.10*  --   --   --   --  <0.10* <0.10*  CREATININE 4.02* 6.76*  --  3.50*  --   --  5.63*  < > = values in this interval not displayed.  Estimated Creatinine Clearance: 16.6 mL/min (by C-G formula based on Cr of 5.63).  Assessment: 48 yom on Coumadin pta for his LVAD, admitted with driveline abscess, sternal wound infection, and MSSA bacteremia. Home Coumadin held, and heparin bridge started 5/10 pending OR trips for wound debridement.   Heparin remains < 0.1 and bleeding continues to be an issue so MD has asked that rate not be increased. Will need to discuss rate adjustment and recommendations with Dr. Darcey Nora. S/p possible bleeding 5/24 with drop H/H received 2uts PRBC and CBC stable today Restart heparin gtt 5/25 afternoon at 1000 units/hr without titration and monitor for bleeding  Goal of Therapy:  Heparin level 0.3-0.4 eventually when more stable Monitor platelets by anticoagulation protocol: Yes   Plan:  - Continue heparin at 1000 units/hr and monitor for bleeding - No further up titration of heparin even with low HL - Daily heparin level to monitor for  elevations and CBC - F/u plans to resume Coumadin eventually  Oreste Majeed K. Velva Harman, PharmD, BCPS, CPP Clinical Pharmacist Pager: [phone] Phone: [phone] 09/13/2015 10:07 AM

## 2015-09-13 NOTE — Progress Notes (Signed)
Rehab admissions - Patient asleep on rounds.  I spoke with the RN at the bedside.  Awaiting OT evaluation. Continues on intermittent hemodialysis.  Not ready for inpatient rehab today.  Will follow up on Monday.  Call me for questions.  RC:[phone]

## 2015-09-13 NOTE — Progress Notes (Signed)
    [facility] for Infectious Disease   Reason for visit: Follow up on drive line infection, C diff  Interval History:   Afebrile.  Is in better spirits, he ambulated, sat in chair. Starting to produce urine. Only 2 BM per day Physical Exam: Constitutional:  Filed Vitals:   09/13/15 1800 09/13/15 1900  BP: 122/88 127/86  Pulse: 85 86  Temp:    Resp: 20 20   GEN: watching TV, patient appears in NAD HEENT: slight scleral icterus Cors: LVAD generator sounds Respiratory: Normal respiratory effort; CTA B anterior exam GI: soft, nt, + BS, bandaged MS: no edema Skin: no rashes   Review of Systems: Respiratory: negative for cough Integument/breast: negative for rash  Lab Results  Component Value Date   WBC 19.2* 09/13/2015   HGB 9.5* 09/13/2015   HCT 28.0* 09/13/2015   MCV 79.9 09/13/2015   PLT 180 09/13/2015    Lab Results  Component Value Date   CREATININE 2.70* 09/13/2015   BUN 16 09/13/2015   NA 140 09/13/2015   K 3.6 09/13/2015   CL 100* 09/13/2015   CO2 26 09/13/2015    Lab Results  Component Value Date   ALT 37 09/13/2015   AST 95* 09/13/2015   ALKPHOS 136* 09/13/2015     Microbiology: No results found for this or any previous visit (from the past 240 hour(s)).  Impression/Plan:  1. MSSA Drive line infection - continue on cefazolin and continue on rifampin 2. Aspiration pneumonia - treated 3.  C diff -  He has 2 BM per day. Currently on a taper of 125mg  TID, will change to BID next week 4. AKi = continue with renally dosed cefazolin, appears to have some improvement. Spoke with Dr. Posey Pronto who is also reassured with improvement 5. Abdominal wound = plastic surgery doing daily dressing changes but may close fascia next week 6. Leukocytosis = has low threshold to culture. Will check cbc with diff tomorrow. No apparent sources noted thus far  Dr Johnnye Sima to see  Over the long weekend. i will see him back on Tuesday  Clor[facility] [phone]

## 2015-09-13 NOTE — Progress Notes (Signed)
4 Days Post-Op  Subjective: Patient reports feeling stronger today.  ABD distended today, but patient reports passing flatus and had BM yesterday.   Dressing change to ABD wound done under sterile techniques today and patient tolerated well. Trace blood on dressings.   Objective: Vital signs in last 24 hours: Temp:  [97.7 F (36.5 C)-99.5 F (37.5 C)] 97.7 F (36.5 C) (05/26 0830) Pulse Rate:  [76-84] 82 (05/26 1045) Resp:  [13-19] 18 (05/26 1045) BP: (86-142)/(74-98) 139/96 mmHg (05/26 1045) SpO2:  [98 %-100 %] 100 % (05/26 0800) Weight:  [82 kg (180 lb 12.4 oz)-83.2 kg (183 lb 6.8 oz)] 82 kg (180 lb 12.4 oz) (05/26 0700) Last BM Date: 09/13/15  Intake/Output from previous day: 05/25 0701 - 05/26 0700 In: 982 [P.O.:150; I.V.:302; NG/GT:330; IV Piggyback:200] Out: 660 [Urine:660] Intake/Output this shift: Total I/O In: 337.5 [P.O.:200; I.V.:137.5] Out: 110 [Urine:110]  General appearance: alert, cooperative and no distress The ABD wound packing has trace blood on dressing today. Wound bed was pink granulation. More Acell was applied yesterday.   Lab Results:   Recent Labs  09/12/15 0930 09/13/15 0455  WBC 17.1* 19.2*  HGB 9.4* 8.9*  HCT 28.0* 26.7*  PLT 178 180   BMET  Recent Labs  09/12/15 0245 09/13/15 0455  NA 135 135  K 3.2* 3.5  CL 99* 100*  CO2 27 26  GLUCOSE 92 108*  BUN 18 38*  CREATININE 3.50* 5.63*  CALCIUM 7.9* 8.2*   PT/INR No results for input(s): LABPROT, INR in the last 72 hours. ABG No results for input(s): PHART, HCO3 in the last 72 hours.  Invalid input(s): PCO2, PO2  Studies/Results: Dg Chest Port 1 View  09/13/2015  CLINICAL DATA:  Left ventricular assist device EXAM: PORTABLE CHEST 1 VIEW COMPARISON:  09/11/2015 FINDINGS: Support devices in stable position. Mild cardiomegaly. Improving pulmonary edema pattern with mild residual interstitial edema. No confluent opacities or effusions. IMPRESSION: Improving pulmonary edema/ CHF  pattern. Electronically Signed   By: Rolm Baptise M.D.   On: 09/13/2015 07:51   Dg Abd Portable 1v  09/13/2015  CLINICAL DATA:  Ileus EXAM: PORTABLE ABDOMEN - 1 VIEW COMPARISON:  08/22/2015; 09/10/2015; CT abdomen pelvis - 09/11/2015 FINDINGS: Weighted tip enteric tube projects over the expected location of the ligament Treitz. There is mild apparent wall wall thickening involving several loops of small bowel within the left mid hemi abdomen. No definite evidence of enteric obstruction. No supine evidence of pneumoperitoneum. No pneumatosis or portal venous gas. LVAD device overlies the midline of the upper abdomen. Post median sternotomy. Post cholecystectomy. Additional radiopaque support apparatus overlies the right upper abdominal quadrant and midline of the lower chest. No acute osseus abnormalities. IMPRESSION: 1. Weighted tip enteric tube projects over the expected location of ligamentum Treitz. 2. Apparent mild bowel wall thickening involving several loops of small bowel within the left mid hemi abdomen. No definite evidence of enteric obstruction. Electronically Signed   By: Sandi Mariscal M.D.   On: 09/13/2015 07:51    Anti-infectives: Anti-infectives    Start     Dose/Rate Route Frequency Ordered Stop   09/11/15 0600  vancomycin (VANCOCIN) 50 mg/mL oral solution 125 mg     125 mg Oral Every 8 hours 09/10/15 2232     09/09/15 2000  ceFAZolin (ANCEF) IVPB 1 g/50 mL premix     1 g 100 mL/hr over 30 Minutes Intravenous Every 24 hours 09/09/15 1239     09/09/15 1011  polymyxin B 500,000 Units,  bacitracin 50,000 Units in sodium chloride irrigation 0.9 % 500 mL irrigation  Status:  Discontinued       As needed 09/09/15 1012 09/09/15 1047   09/09/15 0800  ceFAZolin (ANCEF) IVPB 1 g/50 mL premix  Status:  Discontinued     1 g 100 mL/hr over 30 Minutes Intravenous Every 8 hours 09/09/15 0759 09/09/15 1239   09/09/15 0600  ceFAZolin (ANCEF) IVPB 2g/100 mL premix  Status:  Discontinued     2 g 200  mL/hr over 30 Minutes Intravenous To ShortStay Surgical 09/08/15 1415 09/09/15 1049   09/05/15 1426  polymyxin B 500,000 Units, bacitracin 50,000 Units in sodium chloride irrigation 0.9 % 500 mL irrigation  Status:  Discontinued       As needed 09/05/15 1434 09/05/15 1513   09/05/15 1100  cefTRIAXone (ROCEPHIN) 2 g in dextrose 5 % 50 mL IVPB  Status:  Discontinued     2 g 100 mL/hr over 30 Minutes Intravenous Every 24 hours 09/05/15 0948 09/09/15 1238   09/05/15 1045  ceFAZolin (ANCEF) IVPB 2g/100 mL premix     2 g 200 mL/hr over 30 Minutes Intravenous To ShortStay Surgical 09/04/15 2206 09/05/15 1345   09/04/15 1000  imipenem-cilastatin (PRIMAXIN) 500 mg in sodium chloride 0.9 % 100 mL IVPB  Status:  Discontinued     500 mg 200 mL/hr over 30 Minutes Intravenous Every 12 hours 09/03/15 1817 09/05/15 0948   09/04/15 0600  vancomycin (VANCOCIN) IVPB 1000 mg/200 mL premix  Status:  Discontinued     1,000 mg 200 mL/hr over 60 Minutes Intravenous Every 24 hours 09/03/15 0757 09/03/15 0953   09/03/15 1518  vancomycin (VANCOCIN) 1,000 mg in sodium chloride 0.9 % 1,000 mL irrigation  Status:  Discontinued       As needed 09/03/15 1518 09/03/15 1525   09/03/15 1330  vancomycin (VANCOCIN) 1,000 mg in sodium chloride 0.9 % 1,000 mL irrigation  Status:  Discontinued      Irrigation To Surgery 09/03/15 1325 09/03/15 1628   09/03/15 1200  imipenem-cilastatin (PRIMAXIN) 250 mg in sodium chloride 0.9 % 100 mL IVPB  Status:  Discontinued     250 mg 200 mL/hr over 30 Minutes Intravenous Every 6 hours 09/03/15 0749 09/03/15 1817   09/03/15 0200  vancomycin (VANCOCIN) IVPB 750 mg/150 ml premix  Status:  Discontinued     750 mg 150 mL/hr over 60 Minutes Intravenous Every 12 hours 09/02/15 1234 09/03/15 0745   09/02/15 1400  vancomycin (VANCOCIN) IVPB 1000 mg/200 mL premix     1,000 mg 200 mL/hr over 60 Minutes Intravenous  Once 09/02/15 1234 09/02/15 1430   09/02/15 0900  imipenem-cilastatin (PRIMAXIN) 500 mg  in sodium chloride 0.9 % 100 mL IVPB  Status:  Discontinued     500 mg 200 mL/hr over 30 Minutes Intravenous Every 8 hours 09/02/15 0814 09/03/15 0749   08/30/15 0400  vancomycin (VANCOCIN) 1,250 mg in sodium chloride 0.9 % 250 mL IVPB  Status:  Discontinued     1,250 mg 166.7 mL/hr over 90 Minutes Intravenous To Surgery 08/29/15 0611 08/29/15 1012   08/30/15 0400  cefUROXime (ZINACEF) 1.5 g in dextrose 5 % 50 mL IVPB  Status:  Discontinued     1.5 g 100 mL/hr over 30 Minutes Intravenous To Surgery 08/29/15 0611 08/29/15 1012   08/30/15 0400  fluconazole (DIFLUCAN) IVPB 400 mg  Status:  Discontinued     400 mg 100 mL/hr over 120 Minutes Intravenous To Surgery 08/29/15 0611 08/29/15 1012  08/30/15 0400  rifampin (RIFADIN) 600 mg in sodium chloride 0.9 % 100 mL IVPB  Status:  Discontinued     600 mg 200 mL/hr over 30 Minutes Intravenous To Surgery 08/29/15 0611 08/29/15 1012   08/29/15 0845  vancomycin (VANCOCIN) 1,000 mg in sodium chloride 0.9 % 1,000 mL irrigation  Status:  Discontinued      Irrigation To Surgery 08/29/15 0834 08/29/15 1012   08/29/15 0615  cefUROXime (ZINACEF) 750 mg in dextrose 5 % 50 mL IVPB  Status:  Discontinued     750 mg 100 mL/hr over 30 Minutes Intravenous To Surgery 08/29/15 0609 08/29/15 1012   08/29/15 0615  vancomycin (VANCOCIN) powder 1,000 mg     1,000 mg Other To Surgery 08/29/15 0609 08/29/15 0900   08/27/15 2300  vancomycin (VANCOCIN) 50 mg/mL oral solution 125 mg  Status:  Discontinued     125 mg Oral Every 6 hours 08/26/15 2200 08/27/15 0807   08/27/15 1601  vancomycin (VANCOCIN) 1,000 mg in sodium chloride 0.9 % 1,000 mL irrigation  Status:  Discontinued       As needed 08/27/15 1601 08/27/15 1740   08/27/15 1500  vancomycin (VANCOCIN) 1,000 mg in sodium chloride 0.9 % 1,000 mL irrigation  Status:  Discontinued      Irrigation To Surgery 08/27/15 1449 08/27/15 1748   08/27/15 1400  rifampin (RIFADIN) capsule 300 mg     300 mg Oral Every 8 hours  08/27/15 1141     08/27/15 1100  vancomycin (VANCOCIN) 50 mg/mL oral solution 125 mg     125 mg Oral 4 times daily 08/27/15 1052 09/10/15 0959   08/26/15 2145  nafcillin 2 g in dextrose 5 % 100 mL IVPB  Status:  Discontinued     2 g 200 mL/hr over 30 Minutes Intravenous Every 4 hours 08/26/15 2104 09/02/15 0850      Assessment/Plan: s/p Procedure(s): DEBRIDEMENT AND CLOSURE WOUND WITH PLACEMENT OF ABRA CLOSURE DEVICE (N/A) Dressing change done under sterile techniques to ABD wound.  Sherlon Handing was adjusted yesterday and more Acell applied.  Will follow up tomorrow for dressing change and Abra adjustment.   LOS: 18 days   Thelton Graca,PA-C Plastic Surgery [phone]

## 2015-09-13 NOTE — NC FL2 (Signed)
[place] LEVEL OF CARE SCREENING TOOL     IDENTIFICATION  Patient Name: [name] Birthdate: 14-May-1970 Sex: male Admission Date (Current Location): 08/26/2015  [facility] and [place] Number:  Herbalist and Address:  [facility] [place], [place] [address], [place], [place] [address]      Provider Number: [id number]  Attending Physician Name and Address:  Jolaine Artist, MD  Relative Name and Phone Number:       Current Level of Care: Hospital Recommended Level of Care: [place] Prior Approval Number:    Date Approved/Denied:   PASRR Number: [id number]  Discharge Plan: SNF    Current Diagnoses: Patient Active Problem List   Diagnosis Date Noted  . Colitis   . Septic shock ([place])   . Enteritis due to Clostridium difficile   . Staphylococcus aureus bacteremia   . 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[place])   . Abscess of abdominal wall   . Acute on chronic systolic (congestive) heart failure ([place])   . Left ventricular assist device (LVAD) complication [id number]  . Epistaxis 05/21/2015  . Situational depression 04/13/2015  . Acute gout 01/15/2015  . Palliative care encounter 12/17/2014  . Cardiogenic shock ([place])   . Acute on chronic systolic CHF (congestive heart failure) ([place])   . Acute on chronic renal failure ([place])   . Chronic atrial fibrillation ([place])   . Low output heart failure ([place]) 12/14/2014  . Acute abdominal pain   . Near syncope   . Hypotension 11/30/2014  . AKI (acute kidney injury) ([place])   . Abnormal LFTs 10/26/2012  . Hyperthyroidism 05/26/2011  . 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 ICD-[employer] 07/22/2010  . Nonischemic cardiomyopathy ([place]) 07/22/2010  . Essential hypertension, benign 11/21/2008  . SYSTOLIC HEART FAILURE, CHRONIC 10/23/2008    Orientation RESPIRATION BLADDER Height & Weight     Self, Time, Situation, Place  Normal Continent Weight:  178 lb 9.2 oz (81 kg) Height:  5\' 7"  (170.2 cm)  BEHAVIORAL SYMPTOMS/MOOD NEUROLOGICAL BOWEL NUTRITION STATUS   (None)  (None) Continent Diet (Clear Liquids)  AMBULATORY STATUS COMMUNICATION OF NEEDS Skin   Extensive Assist Verbally Surgical wounds (Incision Closed: Chest Moist to Dry PRN, Upper Abdomen  Skip Mayer, RT Chest)                       Personal Care Assistance Level of Assistance  Bathing, Dressing, Feeding Bathing Assistance: Limited assistance Feeding assistance: Independent Dressing Assistance: Limited assistance     Functional Limitations Info  Sight, Hearing, Speech Sight Info: Adequate Hearing Info: Adequate Speech Info: Adequate    SPECIAL CARE FACTORS FREQUENCY  PT (By licensed PT), OT (By licensed OT)     PT Frequency: 5/ week OT Frequency: 5/ week            Contractures Contractures Info: Not present    Additional Factors Info  Code Status, Allergies, Isolation Precautions Code Status Info: Partial  Allergies Info: Phytonadione     Isolation Precautions Info: Enteric precautions (UV disinfection)     Current Medications (09/13/2015):  This is the current hospital active medication list Current Facility-Administered Medications  Medication Dose Route Frequency Provider Last Rate Last Dose  . 0.9 %  sodium chloride infusion  250 mL Intravenous PRN Conrad Kirkwood, NP 0 mL/hr at 09/05/15 1530    . 0.9 %  sodium chloride infusion  100 mL Intravenous PRN Rexene Agent, MD      .  0.9 %  sodium chloride infusion  100 mL Intravenous PRN Rexene Agent, MD      . 0.9 %  sodium chloride infusion   Intravenous Once Rexene Agent, MD 10 mL/hr at 09/13/15 0400    . allopurinol (ZYLOPRIM) tablet 100 mg  100 mg Oral Daily Jolaine Artist, MD   100 mg at 09/13/15 0902  . alteplase (CATHFLO ACTIVASE) injection 2 mg  2 mg Intracatheter Once PRN Rexene Agent, MD      . amiodarone (PACERONE) tablet 100 mg  100 mg Oral Daily Amy D Clegg, NP   100 mg  at 09/13/15 0903  . bisacodyl (DULCOLAX) EC tablet 10 mg  10 mg Oral Daily PRN Amy D Clegg, NP       Or  . bisacodyl (DULCOLAX) suppository 10 mg  10 mg Rectal Daily PRN Amy D Clegg, NP      . ceFAZolin (ANCEF) IVPB 1 g/50 mL premix  1 g Intravenous Q24H Ivin Poot, MD   1 g at 09/12/15 2200  . citalopram (CELEXA) tablet 20 mg  20 mg Oral Daily Amy D Clegg, NP   20 mg at 09/13/15 0903  . dextrose 5 %-0.9 % sodium chloride infusion   Intravenous Continuous Ivin Poot, MD 30 mL/hr at 09/13/15 [phone]    . docusate sodium (COLACE) capsule 200 mg  200 mg Oral Daily Amy D Clegg, NP   200 mg at 09/11/15 1017  . feeding supplement (NEPRO CARB STEADY) liquid 1,000 mL  1,000 mL Per Tube Continuous Jolaine Artist, MD 15 mL/hr at 09/13/15 1000 1,000 mL at 09/13/15 1000  . feeding supplement (PRO-STAT SUGAR FREE 64) liquid 30 mL  30 mL Per Tube TID Jolaine Artist, MD   30 mL at 09/13/15 1257  . fentaNYL (SUBLIMAZE) injection 25 mcg  25 mcg Intravenous Q4H PRN Ivin Poot, MD   25 mcg at 09/12/15 [phone]  . ferrous sulfate tablet 325 mg  325 mg Oral BID WC Amy D Clegg, NP   325 mg at 09/13/15 0903  . gelatin adsorbable (GELFOAM/SURGIFOAM) sponge 12-7 mm 2 each  2 each Topical BID PRN Gaye Pollack, MD      . Gerhardt's butt cream   Topical PRN Ivin Poot, MD      . guaiFENesin [facility]) 12 hr tablet 600 mg  600 mg Oral BID Ivin Poot, MD   600 mg at 09/13/15 0903  . heparin ADULT infusion 100 units/mL (25000 units/241mL sodium chloride 0.45%)  1,000 Units/hr Intravenous Continuous Jolaine Artist, MD 10 mL/hr at 09/13/15 0400 1,000 Units/hr at 09/13/15 0400  . magic mouthwash  10 mL Oral TID Ivin Poot, MD   10 mL at 09/13/15 0903  . metoCLOPramide (REGLAN) injection 5 mg  5 mg Intravenous Q6H Jolaine Artist, MD   5 mg at 09/13/15 1257  . ondansetron (ZOFRAN) injection 4 mg  4 mg Intravenous Q4H PRN Amy D Clegg, NP   4 mg at 09/12/15 0900  . oxyCODONE (Oxy IR/ROXICODONE)  immediate release tablet 5 mg  5 mg Oral Q3H PRN Ivin Poot, MD   5 mg at 09/13/15 0402  . pantoprazole (PROTONIX) injection 40 mg  40 mg Intravenous Q12H Jolaine Artist, MD   40 mg at 09/13/15 0909  . pregabalin (LYRICA) capsule 75 mg  75 mg Oral Daily Jolaine Artist, MD   75 mg at 09/13/15 0904  . rifampin (  RIFADIN) capsule 300 mg  300 mg Oral Q8H Jolaine Artist, MD   300 mg at 09/13/15 1303  . sildenafil (REVATIO) tablet 20 mg  20 mg Oral Q8H Amy D Clegg, NP   20 mg at 09/13/15 1302  . simethicone (MYLICON) chewable tablet 80 mg  80 mg Oral QID Ivin Poot, MD   80 mg at 09/13/15 1302  . sodium chloride flush (NS) 0.9 % injection 10-40 mL  10-40 mL Intracatheter Q12H Jolaine Artist, MD   40 mL at 09/13/15 0910  . sodium chloride flush (NS) 0.9 % injection 10-40 mL  10-40 mL Intracatheter PRN Jolaine Artist, MD   40 mL at 09/07/15 2218  . sodium chloride flush (NS) 0.9 % injection 3 mL  3 mL Intravenous Q12H Amy D Clegg, NP   3 mL at 09/13/15 0904  . sodium chloride flush (NS) 0.9 % injection 3 mL  3 mL Intravenous PRN Amy D Clegg, NP      . tamsulosin (FLOMAX) capsule 0.4 mg  0.4 mg Oral Daily Jolaine Artist, MD   0.4 mg at 09/13/15 0903  . traMADol (ULTRAM) tablet 50 mg  50 mg Oral Q6H PRN Jolaine Artist, MD   50 mg at 09/06/15 0956  . vancomycin (VANCOCIN) 50 mg/mL oral solution 125 mg  125 mg Oral Q8H Jolaine Artist, MD   125 mg at 09/13/15 1302     Discharge Medications: Please see discharge summary for a list of discharge medications.  Relevant Imaging Results:  Relevant Lab Results:   Additional Information SSN: [id number]  Samule Dry, LCSW

## 2015-09-14 ENCOUNTER — Inpatient Hospital Stay (HOSPITAL_COMMUNITY): Payer: 59

## 2015-09-14 LAB — GLUCOSE, CAPILLARY
GLUCOSE-CAPILLARY: 99 mg/dL (ref 65–99)
Glucose-Capillary: 102 mg/dL — ABNORMAL HIGH (ref 65–99)
Glucose-Capillary: 110 mg/dL — ABNORMAL HIGH (ref 65–99)
Glucose-Capillary: 74 mg/dL (ref 65–99)
Glucose-Capillary: 85 mg/dL (ref 65–99)
Glucose-Capillary: 93 mg/dL (ref 65–99)
Glucose-Capillary: 95 mg/dL (ref 65–99)

## 2015-09-14 LAB — CBC
HCT: 23.4 % — ABNORMAL LOW (ref 39.0–52.0)
Hemoglobin: 7.8 g/dL — ABNORMAL LOW (ref 13.0–17.0)
MCH: 27.2 pg (ref 26.0–34.0)
MCHC: 33.3 g/dL (ref 30.0–36.0)
MCV: 81.5 fL (ref 78.0–100.0)
Platelets: 164 10*3/uL (ref 150–400)
RBC: 2.87 MIL/uL — ABNORMAL LOW (ref 4.22–5.81)
RDW: 21.5 % — ABNORMAL HIGH (ref 11.5–15.5)
WBC: 16.2 10*3/uL — ABNORMAL HIGH (ref 4.0–10.5)

## 2015-09-14 LAB — CARBOXYHEMOGLOBIN
Carboxyhemoglobin: 2.8 % — ABNORMAL HIGH (ref 0.5–1.5)
METHEMOGLOBIN: 1.3 % (ref 0.0–1.5)
O2 SAT: 63.5 %
TOTAL HEMOGLOBIN: 8.5 g/dL — AB (ref 13.5–18.0)

## 2015-09-14 LAB — MAGNESIUM: MAGNESIUM: 2.2 mg/dL (ref 1.7–2.4)

## 2015-09-14 LAB — COMPREHENSIVE METABOLIC PANEL
ALT: 30 U/L (ref 17–63)
AST: 74 U/L — ABNORMAL HIGH (ref 15–41)
Albumin: 1.5 g/dL — ABNORMAL LOW (ref 3.5–5.0)
Alkaline Phosphatase: 161 U/L — ABNORMAL HIGH (ref 38–126)
Anion gap: 9 (ref 5–15)
BUN: 25 mg/dL — ABNORMAL HIGH (ref 6–20)
CO2: 27 mmol/L (ref 22–32)
Calcium: 8.4 mg/dL — ABNORMAL LOW (ref 8.9–10.3)
Chloride: 102 mmol/L (ref 101–111)
Creatinine, Ser: 4.02 mg/dL — ABNORMAL HIGH (ref 0.61–1.24)
GFR calc Af Amer: 19 mL/min — ABNORMAL LOW (ref >60)
GFR calc non Af Amer: 16 mL/min — ABNORMAL LOW (ref >60)
Glucose, Bld: 92 mg/dL (ref 65–99)
Potassium: 3.4 mmol/L — ABNORMAL LOW (ref 3.5–5.1)
Sodium: 138 mmol/L (ref 135–145)
Total Bilirubin: 7.5 mg/dL — ABNORMAL HIGH (ref 0.3–1.2)
Total Protein: 6 g/dL — ABNORMAL LOW (ref 6.5–8.1)

## 2015-09-14 LAB — DIFFERENTIAL
BASOS ABS: 0 10*3/uL (ref 0.0–0.1)
Basophils Relative: 0 %
EOS ABS: 0.1 10*3/uL (ref 0.0–0.7)
Eosinophils Relative: 1 %
LYMPHS ABS: 1.2 10*3/uL (ref 0.7–4.0)
LYMPHS PCT: 8 %
Monocytes Absolute: 1.2 10*3/uL — ABNORMAL HIGH (ref 0.1–1.0)
Monocytes Relative: 7 %
NEUTROS PCT: 84 %
Neutro Abs: 13.7 10*3/uL — ABNORMAL HIGH (ref 1.7–7.7)

## 2015-09-14 LAB — PREPARE RBC (CROSSMATCH)

## 2015-09-14 LAB — HEPARIN LEVEL (UNFRACTIONATED): Heparin Unfractionated: <0.1 IU/mL — ABNORMAL LOW (ref 0.30–0.70)

## 2015-09-14 LAB — LACTATE DEHYDROGENASE: LDH: 267 U/L — AB (ref 98–192)

## 2015-09-14 MED ORDER — POTASSIUM CHLORIDE 10 MEQ/50ML IV SOLN
10.0000 meq | INTRAVENOUS | Status: AC
  Administered 2015-09-14 (×4): 10 meq via INTRAVENOUS
  Filled 2015-09-14: qty 50

## 2015-09-14 MED ORDER — HYDRALAZINE HCL 25 MG PO TABS
25.0000 mg | ORAL_TABLET | Freq: Three times a day (TID) | ORAL | Status: DC
  Administered 2015-09-14 – 2015-09-15 (×3): 25 mg via ORAL
  Filled 2015-09-14 (×3): qty 1

## 2015-09-14 NOTE — Progress Notes (Signed)
Patient ID: MACARIO NEYER, male   DOB: [birth year], 45 y.o.   MRN: [id number]  [facility] Progress Note   Assessment/ Plan:   1. AKI Suspected to be ATN from sepsis/shock. Oliguric overnight (720 mL urine output) and underwent dialysis yesterday for clearance. Plan to monitor him over the weekend for any acute dialysis needs otherwise, continue daily Lasix for assessment of renal recovery as underlying infectious processes/sepsis are treated. 2. LVAD MSSA driveline infection/infected VAD pocket: On intravenous antibiotic therapy and now status post debridement and initial step towards wound closure. On oral rifampin. 3. Clostridium difficile colitis: On oral vancomycin, reports significant clinical improvement. 4. Hypokalemia: Likely from GI/dialysis associated losses- Place cautiously via intravenous route-currently nothing by mouth with NG tube/suction with ileus. 5. Nonischemic cardiomyopathy/chronic systolic heart failure: supported with LVAD.  Subjective:   Denies any acute events overnight- reports the diarrhea appears to be improving.    Objective:   BP 134/89 mmHg  Pulse 80  Temp(Src) 98.4 F (36.9 C) (Oral)  Resp 17  Ht 5\' 7"  (1.702 m)  Wt 81.2 kg (179 lb 0.2 oz)  BMI 28.03 kg/m2  SpO2 100%  Intake/Output Summary (Last 24 hours) at 09/14/15 0759 Last data filed at 09/14/15 0700  Gross per 24 hour  Intake 2507.5 ml  Output   1720 ml  Net  787.5 ml   Weight change: -2.2 kg (-4 lb 13.6 oz)  Physical Exam: [id number] resting in bed, NG tube in situ CVS: Pulse regular rhythm, normal rate Resp: Poor inspiratory effort-decreased breath sounds bilaterally Abd: Slightly tender, flat, bowel sounds high pitched Ext: Trace lower extremity edema  Imaging: Dg Chest Port 1 View  09/13/2015  CLINICAL DATA:  Left ventricular assist device EXAM: PORTABLE CHEST 1 VIEW COMPARISON:  09/11/2015 FINDINGS: Support devices in stable position. Mild cardiomegaly.  Improving pulmonary edema pattern with mild residual interstitial edema. No confluent opacities or effusions. IMPRESSION: Improving pulmonary edema/ CHF pattern. Electronically Signed   By: Rolm Baptise M.D.   On: 09/13/2015 07:51   Dg Abd Portable 1v  09/13/2015  CLINICAL DATA:  Ileus EXAM: PORTABLE ABDOMEN - 1 VIEW COMPARISON:  08/22/2015; 09/10/2015; CT abdomen pelvis - 09/11/2015 FINDINGS: Weighted tip enteric tube projects over the expected location of the ligament Treitz. There is mild apparent wall wall thickening involving several loops of small bowel within the left mid hemi abdomen. No definite evidence of enteric obstruction. No supine evidence of pneumoperitoneum. No pneumatosis or portal venous gas. LVAD device overlies the midline of the upper abdomen. Post median sternotomy. Post cholecystectomy. Additional radiopaque support apparatus overlies the right upper abdominal quadrant and midline of the lower chest. No acute osseus abnormalities. IMPRESSION: 1. Weighted tip enteric tube projects over the expected location of ligamentum Treitz. 2. Apparent mild bowel wall thickening involving several loops of small bowel within the left mid hemi abdomen. No definite evidence of enteric obstruction. Electronically Signed   By: Sandi Mariscal M.D.   On: 09/13/2015 07:51    Labs: BMET  Recent Labs Lab 09/08/15 1201  09/10/15 0155  09/10/15 1250 09/10/15 1526 09/11/15 0515 09/11/15 0820 09/12/15 0245 09/13/15 0455 09/13/15 1608 09/14/15 0515  NA 135  < > 136  --  135 136 141 136 135 135 140 138  K 3.0*  < > 3.0*  < > 3.4* 5.5* <2.0* 3.2* 3.2* 3.5 3.6 3.4*  CL 102  < > 100*  --  104 102 119* 103 99* 100* 100* 102  CO2  22  --  26  --  25  --  16* 22 27 26   --  27  GLUCOSE 105*  < > 101*  --  81 72 84 88 92 108* 173* 92  BUN 53*  < > 32*  --  39* 37* 31* 50* 18 38* 16 25*  CREATININE 7.03*  < > 5.54*  --  5.71* 6.10* 4.02* 6.76* 3.50* 5.63* 2.70* 4.02*  CALCIUM 8.5*  --  8.1*  --  7.8*  --   4.9* 8.1* 7.9* 8.2*  --  8.4*  PHOS 5.8*  --   --   --   --   --   --   --   --   --   --   --   < > = values in this interval not displayed. CBC  Recent Labs Lab 09/12/15 0245 09/12/15 0930 09/13/15 0455 09/13/15 1608 09/14/15 0515  WBC 16.5* 17.1* 19.2*  --  16.2*  NEUTROABS  --   --   --   --  13.7*  HGB 9.9* 9.4* 8.9* 9.5* 7.8*  HCT 28.8* 28.0* 26.7* 28.0* 23.4*  MCV 78.7 78.9 79.9  --  81.5  PLT 182 178 180  --  164    Medications:    . sodium chloride   Intravenous Once  . allopurinol  100 mg Oral Daily  . amiodarone  100 mg Oral Daily  .  ceFAZolin (ANCEF) IV  1 g Intravenous Q24H  . citalopram  20 mg Oral Daily  . docusate sodium  200 mg Oral Daily  . feeding supplement (PRO-STAT SUGAR FREE 64)  30 mL Per Tube TID  . ferrous sulfate  325 mg Oral BID WC  . guaiFENesin  600 mg Oral BID  . magic mouthwash  10 mL Oral TID  . metoCLOPramide (REGLAN) injection  5 mg Intravenous Q6H  . pantoprazole (PROTONIX) IV  40 mg Intravenous Q12H  . pregabalin  75 mg Oral Daily  . rifampin  300 mg Oral Q8H  . sildenafil  20 mg Oral Q8H  . simethicone  80 mg Oral QID  . sodium chloride flush  10-40 mL Intracatheter Q12H  . sodium chloride flush  3 mL Intravenous Q12H  . tamsulosin  0.4 mg Oral Daily  . vancomycin  125 mg Oral Q8H   Elmarie Shiley, MD 09/14/2015, 7:59 AM

## 2015-09-14 NOTE — Progress Notes (Signed)
Patient ID: [name], male   DOB: 06-28-1970, 45 y.o.   MRN: [id number]    HeartMate 2 Rounding Note  Subjective:    Admitted from [facility] in [place] with driveline abscess. Blood cultures --> MSSA and has C diff. Remains on Nafcillin  and oral vancomycin.   08/27/15 S/P I &D Epigastric with hardware exposed and VAC placement.  5/11 repeat I&D and wound vac replacement.  5/11 TEE no vegetations on valves or ICD wire 5/22 S/P ABRA and Acell   Nauseated this am but no emesis. Otherwise feels ok. No fevers. Making about 30cc urine per hour. MAPs up 100-110. Hgb back down. Dr Prescott Gum ordered 1 u PRBcs. Walked a bit yesterday   VAD INTERROGATION:  HeartMate II LVAD:  Flow 46  liters/min, speed 9200, power 5.2  PI 8.3    Objective:    Vital Signs:   Temp:  [97.9 F (36.6 C)-98.4 F (36.9 C)] 98.4 F (36.9 C) (05/27 0420) Pulse Rate:  [77-88] 82 (05/27 1000) Resp:  [14-22] 19 (05/27 1000) BP: (106-147)/(78-99) 147/95 mmHg (05/27 1000) SpO2:  [98 %-100 %] 100 % (05/27 1000) Weight:  [81 kg (178 lb 9.2 oz)-81.2 kg (179 lb 0.2 oz)] 81.2 kg (179 lb 0.2 oz) (05/27 0500) Last BM Date: 09/14/15 Mean arterial Pressure  100-110  Intake/Output:   Intake/Output Summary (Last 24 hours) at 09/14/15 1031 Last data filed at 09/14/15 0900  Gross per 24 hour  Intake   2325 ml  Output   1800 ml  Net    525 ml     Physical Exam: CVP ~ 7-8 General: In bed. NAD HEENT: Scleral icterus Neck: supple. Carotids 2+ bilat; no bruits. No lymphadenopathy or thryomegaly appreciated. R subclavian trialysis cath dressing dry Cor: Mechanical heart sounds with LVAD hum present. Lungs: clear on  Abdomen: soft, tender,  Less distended. + bowel sounds. Mid epigastric dressing ok.   Driveline: C/D/I; securement device intact and driveline incorporated Extremities: no cyanosis, clubbing, rash, no edema  Neuro: A & O x3  Telemetry: NSR 80s   Labs: Basic Metabolic  Panel:  Recent Labs Lab 09/08/15 1201  09/10/15 0155  09/11/15 0515 09/11/15 0820 09/12/15 0245 09/13/15 0455 09/13/15 1608 09/14/15 0515 09/14/15 0906  NA 135  < > 136  < > 141 136 135 135 140 138  --   K 3.0*  < > 3.0*  < > <2.0* 3.2* 3.2* 3.5 3.6 3.4*  --   CL 102  < > 100*  < > 119* 103 99* 100* 100* 102  --   CO2 22  --  26  < > 16* 22 27 26   --  27  --   GLUCOSE 105*  < > 101*  < > 84 88 92 108* 173* 92  --   BUN 53*  < > 32*  < > 31* 50* 18 38* 16 25*  --   CREATININE 7.03*  < > 5.54*  < > 4.02* 6.76* 3.50* 5.63* 2.70* 4.02*  --   CALCIUM 8.5*  --  8.1*  < > 4.9* 8.1* 7.9* 8.2*  --  8.4*  --   MG  --   --  2.3  --   --  2.7*  --   --   --   --  2.2  PHOS 5.8*  --   --   --   --   --   --   --   --   --   --   < > =  values in this interval not displayed.  Liver Function Tests:  Recent Labs Lab 09/10/15 0155 09/11/15 0515 09/12/15 0245 09/13/15 0455 09/14/15 0515  AST 61* 33 78* 95* 74*  ALT 15* 13* 25 37 30  ALKPHOS 112 78 168* 136* 161*  BILITOT 8.6* 5.3* 8.8* 9.5* 7.5*  PROT 5.7* 3.2* 6.2* 5.9* 6.0*  ALBUMIN 1.5* <1.0* 1.6* 1.5* 1.5*   No results for input(s): LIPASE, AMYLASE in the last 168 hours. No results for input(s): AMMONIA in the last 168 hours.  CBC:  Recent Labs Lab 09/11/15 1300 09/12/15 0245 09/12/15 0930 09/13/15 0455 09/13/15 1608 09/14/15 0515  WBC 17.5* 16.5* 17.1* 19.2*  --  16.2*  NEUTROABS  --   --   --   --   --  13.7*  HGB 9.7* 9.9* 9.4* 8.9* 9.5* 7.8*  HCT 28.7* 28.8* 28.0* 26.7* 28.0* 23.4*  MCV 79.1 78.7 78.9 79.9  --  81.5  PLT 153 182 178 180  --  164    INR: No results for input(s): INR in the last 168 hours.  Other results:    Imaging: Dg Chest Port 1 View  09/13/2015  CLINICAL DATA:  Left ventricular assist device EXAM: PORTABLE CHEST 1 VIEW COMPARISON:  09/11/2015 FINDINGS: Support devices in stable position. Mild cardiomegaly. Improving pulmonary edema pattern with mild residual interstitial edema. No  confluent opacities or effusions. IMPRESSION: Improving pulmonary edema/ CHF pattern. Electronically Signed   By: Rolm Baptise M.D.   On: 09/13/2015 07:51   Dg Abd Portable 1v  09/13/2015  CLINICAL DATA:  Ileus EXAM: PORTABLE ABDOMEN - 1 VIEW COMPARISON:  08/22/2015; 09/10/2015; CT abdomen pelvis - 09/11/2015 FINDINGS: Weighted tip enteric tube projects over the expected location of the ligament Treitz. There is mild apparent wall wall thickening involving several loops of small bowel within the left mid hemi abdomen. No definite evidence of enteric obstruction. No supine evidence of pneumoperitoneum. No pneumatosis or portal venous gas. LVAD device overlies the midline of the upper abdomen. Post median sternotomy. Post cholecystectomy. Additional radiopaque support apparatus overlies the right upper abdominal quadrant and midline of the lower chest. No acute osseus abnormalities. IMPRESSION: 1. Weighted tip enteric tube projects over the expected location of ligamentum Treitz. 2. Apparent mild bowel wall thickening involving several loops of small bowel within the left mid hemi abdomen. No definite evidence of enteric obstruction. Electronically Signed   By: Sandi Mariscal M.D.   On: 09/13/2015 07:51     Medications:     Scheduled Medications: . sodium chloride   Intravenous Once  . allopurinol  100 mg Oral Daily  . amiodarone  100 mg Oral Daily  .  ceFAZolin (ANCEF) IV  1 g Intravenous Q24H  . citalopram  20 mg Oral Daily  . docusate sodium  200 mg Oral Daily  . feeding supplement (PRO-STAT SUGAR FREE 64)  30 mL Per Tube TID  . ferrous sulfate  325 mg Oral BID WC  . guaiFENesin  600 mg Oral BID  . magic mouthwash  10 mL Oral TID  . metoCLOPramide (REGLAN) injection  5 mg Intravenous Q6H  . pantoprazole (PROTONIX) IV  40 mg Intravenous Q12H  . potassium chloride  10 mEq Intravenous Q1 Hr x 4  . pregabalin  75 mg Oral Daily  . rifampin  300 mg Oral Q8H  . sildenafil  20 mg Oral Q8H  .  simethicone  80 mg Oral QID  . sodium chloride flush  10-40 mL Intracatheter Q12H  .  sodium chloride flush  3 mL Intravenous Q12H  . tamsulosin  0.4 mg Oral Daily  . vancomycin  125 mg Oral Q8H    Infusions: . dextrose 5 % and 0.9% NaCl 30 mL/hr at 09/14/15 0400  . feeding supplement (NEPRO CARB STEADY) 1,000 mL (09/14/15 0400)  . heparin 1,000 Units/hr (09/14/15 0400)    PRN Medications: sodium chloride, sodium chloride, sodium chloride, alteplase, bisacodyl **OR** bisacodyl, fentaNYL (SUBLIMAZE) injection, gelatin adsorbable, Gerhardt's butt cream, ondansetron, oxyCODONE, sodium chloride flush, sodium chloride flush, traMADol   Assessment:   1. LVAD Complication- Driveline abscess 2. MSSA bacteremia -> septic shock 3. Acute/Chronic systolic HF s/p VAD placement 9/16 4. C. Difficile colitis 5. 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 Acute blood loss anemia 8. Severe Malnutrition.   9.. ARF 10. Klebsiella (ESBL) pneumonia 11. NSVT  12. Hypokalemia  13. RV Failure   Plan/Discussion:    S/P I&D LVAD driveline abscess. Exposed hardware.   09/09/15 S/P ABRA and reapplication of ACell. Adjustments per Plastics. WBC  Back down. Appreciate ID input.  Remains on cefazoliin for MSSA bacteremia and oral vanc for c.diff. [place] PNA has been treated.  No ectopy overnight. K 3.4 CO-OX 63% Will sup K  Has RV failure. Maps 100-105. Continue 20 mg sildenafil. Add back low-dose hydralazine for HTN   Urine output improving. HD as needed   VAD parameters ok. Hgb down again this morning, Transfusing. On low dose heparin. Will start coumadin when ok from surgical standpoint.   Back on tube feeds. Continue reglan for nausea. On protonix as well,   PT following. Recommending CIR. CIR following.     Length of Stay: 19   Glori Bickers MD  09/14/2015, 10:31 AM  VAD Team --- VAD ISSUES ONLY--- Pager [phone] (7am - 7am)  Advanced Heart Failure  Team  Pager [phone] (M-F; 7a - 4p)  Please contact [facility] for night-coverage after hours (4p -7a ) and weekends on [email or web address]

## 2015-09-14 NOTE — Progress Notes (Signed)
5 Days Post-Op Procedure(s) (LRB): DEBRIDEMENT AND CLOSURE WOUND WITH PLACEMENT OF ABRA CLOSURE DEVICE (N/A) Subjective: Patient nauseated again with some abdominal distention Tube feeds now going at goal Mean arterial pressure elevated-medications adjusted Abdominal wound inspected and personally changed. More saline gauze applied to cover the Abra  and A- cell covering of the wound  Patient remains on heparin 1000 units per hour with satisfactory LDH Hemoglobin now less than 7. 5-1 unit packed cells Would not increase heparin greater than 1000 units per hour despite low level  Objective: Vital signs in last 24 hours: Temp:  [97.9 F (36.6 C)-98.4 F (36.9 C)] 98.4 F (36.9 C) (05/27 0420) Pulse Rate:  [77-88] 82 (05/27 1000) Cardiac Rhythm:  [-] Normal sinus rhythm (05/27 0830) Resp:  [14-22] 19 (05/27 1000) BP: (106-147)/(78-99) 147/95 mmHg (05/27 1000) SpO2:  [98 %-100 %] 100 % (05/27 1000) Weight:  [178 lb 9.2 oz (81 kg)-179 lb 0.2 oz (81.2 kg)] 179 lb 0.2 oz (81.2 kg) (05/27 0500)  Hemodynamic parameters for last 24 hours: CVP:  [5 mmHg-6 mmHg] 6 mmHg  Intake/Output from previous day: 05/26 0701 - 05/27 0700 In: 2507.5 [P.O.:580; I.V.:1137.5; NG/GT:790] Out: 1720 [Urine:720] Intake/Output this shift: Total I/O In: 155 [I.V.:50; NG/GT:105] Out: 190 [Urine:190]       Exam    General- alert and comfortable   Lungs- clear without rales, wheezes   Cor- regular rate and rhythm, no murmur , gallop   Abdomen- soft, non-tender   Extremities - warm, non-tender, minimal edema   Neuro- oriented, appropriate, no focal weakness   Lab Results:  Recent Labs  09/13/15 0455 09/13/15 1608 09/14/15 0515  WBC 19.2*  --  16.2*  HGB 8.9* 9.5* 7.8*  HCT 26.7* 28.0* 23.4*  PLT 180  --  164   BMET:  Recent Labs  09/13/15 0455 09/13/15 1608 09/14/15 0515  NA 135 140 138  K 3.5 3.6 3.4*  CL 100* 100* 102  CO2 26  --  27  GLUCOSE 108* 173* 92  BUN 38* 16 25*   CREATININE 5.63* 2.70* 4.02*  CALCIUM 8.2*  --  8.4*    PT/INR: No results for input(s): LABPROT, INR in the last 72 hours. ABG    Component Value Date/Time   PHART 7.332* 09/09/2015 1145   HCO3 20.2 09/09/2015 1145   TCO2 26 09/13/2015 1608   ACIDBASEDEF 5.0* 09/09/2015 1145   O2SAT 63.5 09/14/2015 0512   CBG (last 3)   Recent Labs  09/13/15 2354 09/14/15 0420 09/14/15 0915  GLUCAP 85 93 95    Assessment/Plan: S/P Procedure(s) (LRB): DEBRIDEMENT AND CLOSURE WOUND WITH PLACEMENT OF ABRA CLOSURE DEVICE (N/A) Continue tube feeds Transfuse one unit of packed cells Continue IV antibiotics and local wound care Do not increase heparin greater than 1000 units per hour   LOS: 19 days    Tharon Aquas Trigt III 09/14/2015

## 2015-09-14 NOTE — Progress Notes (Signed)
ANTICOAGULATION CONSULT NOTE - Follow Up Consult  Pharmacy Consult for Heparin Indication: LVAD  Allergies  Allergen Reactions  . Phytonadione Other (See Comments)    Patient has LVAD: please check with LVAD coordinator on call or LVAD MD on call before reversal of anticoagulation with vit k    Patient Measurements: Height: 5\' 7"  (170.2 cm) Weight: 179 lb 0.2 oz (81.2 kg) IBW/kg (Calculated) : 66.1 Heparin Dosing Weight:  85 kg  Vital Signs: Temp: 98.4 F (36.9 C) (05/27 1300) Temp Source: Oral (05/27 1300) BP: 136/99 mmHg (05/27 1300) Pulse Rate: 85 (05/27 1300)  Labs:  Recent Labs  09/12/15 0930 09/12/15 1818 09/13/15 0455 09/13/15 1608 09/14/15 0515  HGB 9.4*  --  8.9* 9.5* 7.8*  HCT 28.0*  --  26.7* 28.0* 23.4*  PLT 178  --  180  --  164  HEPARINUNFRC  --  <0.10* <0.10*  --  <0.10*  CREATININE  --   --  5.63* 2.70* 4.02*    Estimated Creatinine Clearance: 23.2 mL/min (by C-G formula based on Cr of 4.02).  Assessment: 25 yom on Coumadin PTA for his LVAD, admitted with driveline abscess, sternal wound infection, and MSSA bacteremia. Home Coumadin held, and heparin bridge started 5/10 pending OR trips for wound debridement.   Heparin remains < 0.1 and bleeding continues to be an issue so MD has asked that rate not be increased. Will need to discuss rate adjustment and recommendations with Dr. Darcey Nora. S/p possible bleeding 5/24 with drop H/H received 2uts PRBC and CBC stable today Restarted heparin gtt 5/25 afternoon at 1000 units/hr without titration and monitor for bleeding. Hgb 9.5>7.8. Transfuse today.   Goal of Therapy:  Heparin level 0.3-0.4 eventually when more stable Monitor platelets by anticoagulation protocol: Yes   Plan:  - Continue heparin at 1000 units/hr and monitor for bleeding - No further up titration of heparin even with low HL - Daily heparin level to monitor for elevations and CBC - F/u plans to resume Coumadin eventually - Continue  Rifampin 300mg  q8. Dose ok for HD - Continue Vanc 125 q8 po - Continue ancef IV q24 dose ok for HD.  Braun Rocca S. Alford Highland, PharmD, [facility] Clinical Staff Pharmacist Pager [phone]   09/14/2015 2:26 PM

## 2015-09-14 NOTE — Progress Notes (Signed)
5 Days Post-Op  Subjective: Patient reports he is very nauseated this am.  Dressing change done by Dr. Prescott Gum earlier this am.   Sherlon Handing device tightened under sterile techniques and patient tolerated well.  Will follow up tomorrow.  Objective: Vital signs in last 24 hours: Temp:  [97.9 F (36.6 C)-98.4 F (36.9 C)] 98.4 F (36.9 C) (05/27 0420) Pulse Rate:  [78-88] 81 (05/27 1100) Resp:  [14-22] 19 (05/27 1100) BP: (106-147)/(78-99) 140/96 mmHg (05/27 1100) SpO2:  [98 %-100 %] 100 % (05/27 1100) Weight:  [81.2 kg (179 lb 0.2 oz)] 81.2 kg (179 lb 0.2 oz) (05/27 0500) Last BM Date: 09/14/15  Intake/Output from previous day: 05/26 0701 - 05/27 0700 In: 2507.5 [P.O.:580; I.V.:1137.5; NG/GT:790] Out: 1720 [Urine:720] Intake/Output this shift: Total I/O In: 590 [I.V.:200; NG/GT:240; IV Piggyback:150] Out: 190 [Urine:190]  General appearance: cooperative, mild distress and sleepy, but awakens to voice ABD wound dressing change just done earlier this am, so did not redress, but tightened the [place] device further without difficulty  Lab Results:   Recent Labs  09/13/15 0455 09/13/15 1608 09/14/15 0515  WBC 19.2*  --  16.2*  HGB 8.9* 9.5* 7.8*  HCT 26.7* 28.0* 23.4*  PLT 180  --  164   BMET  Recent Labs  09/13/15 0455 09/13/15 1608 09/14/15 0515  NA 135 140 138  K 3.5 3.6 3.4*  CL 100* 100* 102  CO2 26  --  27  GLUCOSE 108* 173* 92  BUN 38* 16 25*  CREATININE 5.63* 2.70* 4.02*  CALCIUM 8.2*  --  8.4*   PT/INR No results for input(s): LABPROT, INR in the last 72 hours. ABG No results for input(s): PHART, HCO3 in the last 72 hours.  Invalid input(s): PCO2, PO2  Studies/Results: Dg Chest Port 1 View  09/13/2015  CLINICAL DATA:  Left ventricular assist device EXAM: PORTABLE CHEST 1 VIEW COMPARISON:  09/11/2015 FINDINGS: Support devices in stable position. Mild cardiomegaly. Improving pulmonary edema pattern with mild residual interstitial edema. No confluent  opacities or effusions. IMPRESSION: Improving pulmonary edema/ CHF pattern. Electronically Signed   By: Rolm Baptise M.D.   On: 09/13/2015 07:51   Dg Abd Portable 1v  09/13/2015  CLINICAL DATA:  Ileus EXAM: PORTABLE ABDOMEN - 1 VIEW COMPARISON:  08/22/2015; 09/10/2015; CT abdomen pelvis - 09/11/2015 FINDINGS: Weighted tip enteric tube projects over the expected location of the ligament Treitz. There is mild apparent wall wall thickening involving several loops of small bowel within the left mid hemi abdomen. No definite evidence of enteric obstruction. No supine evidence of pneumoperitoneum. No pneumatosis or portal venous gas. LVAD device overlies the midline of the upper abdomen. Post median sternotomy. Post cholecystectomy. Additional radiopaque support apparatus overlies the right upper abdominal quadrant and midline of the lower chest. No acute osseus abnormalities. IMPRESSION: 1. Weighted tip enteric tube projects over the expected location of ligamentum Treitz. 2. Apparent mild bowel wall thickening involving several loops of small bowel within the left mid hemi abdomen. No definite evidence of enteric obstruction. Electronically Signed   By: Sandi Mariscal M.D.   On: 09/13/2015 07:51    Anti-infectives: Anti-infectives    Start     Dose/Rate Route Frequency Ordered Stop   09/11/15 0600  vancomycin (VANCOCIN) 50 mg/mL oral solution 125 mg     125 mg Oral Every 8 hours 09/10/15 2232     09/09/15 2000  ceFAZolin (ANCEF) IVPB 1 g/50 mL premix     1 g 100 mL/hr  over 30 Minutes Intravenous Every 24 hours 09/09/15 1239     09/09/15 1011  polymyxin B 500,000 Units, bacitracin 50,000 Units in sodium chloride irrigation 0.9 % 500 mL irrigation  Status:  Discontinued       As needed 09/09/15 1012 09/09/15 1047   09/09/15 0800  ceFAZolin (ANCEF) IVPB 1 g/50 mL premix  Status:  Discontinued     1 g 100 mL/hr over 30 Minutes Intravenous Every 8 hours 09/09/15 0759 09/09/15 1239   09/09/15 0600  ceFAZolin  (ANCEF) IVPB 2g/100 mL premix  Status:  Discontinued     2 g 200 mL/hr over 30 Minutes Intravenous To ShortStay Surgical 09/08/15 1415 09/09/15 1049   09/05/15 1426  polymyxin B 500,000 Units, bacitracin 50,000 Units in sodium chloride irrigation 0.9 % 500 mL irrigation  Status:  Discontinued       As needed 09/05/15 1434 09/05/15 1513   09/05/15 1100  cefTRIAXone (ROCEPHIN) 2 g in dextrose 5 % 50 mL IVPB  Status:  Discontinued     2 g 100 mL/hr over 30 Minutes Intravenous Every 24 hours 09/05/15 0948 09/09/15 1238   09/05/15 1045  ceFAZolin (ANCEF) IVPB 2g/100 mL premix     2 g 200 mL/hr over 30 Minutes Intravenous To ShortStay Surgical 09/04/15 2206 09/05/15 1345   09/04/15 1000  imipenem-cilastatin (PRIMAXIN) 500 mg in sodium chloride 0.9 % 100 mL IVPB  Status:  Discontinued     500 mg 200 mL/hr over 30 Minutes Intravenous Every 12 hours 09/03/15 1817 09/05/15 0948   09/04/15 0600  vancomycin (VANCOCIN) IVPB 1000 mg/200 mL premix  Status:  Discontinued     1,000 mg 200 mL/hr over 60 Minutes Intravenous Every 24 hours 09/03/15 0757 09/03/15 0953   09/03/15 1518  vancomycin (VANCOCIN) 1,000 mg in sodium chloride 0.9 % 1,000 mL irrigation  Status:  Discontinued       As needed 09/03/15 1518 09/03/15 1525   09/03/15 1330  vancomycin (VANCOCIN) 1,000 mg in sodium chloride 0.9 % 1,000 mL irrigation  Status:  Discontinued      Irrigation To Surgery 09/03/15 1325 09/03/15 1628   09/03/15 1200  imipenem-cilastatin (PRIMAXIN) 250 mg in sodium chloride 0.9 % 100 mL IVPB  Status:  Discontinued     250 mg 200 mL/hr over 30 Minutes Intravenous Every 6 hours 09/03/15 0749 09/03/15 1817   09/03/15 0200  vancomycin (VANCOCIN) IVPB 750 mg/150 ml premix  Status:  Discontinued     750 mg 150 mL/hr over 60 Minutes Intravenous Every 12 hours 09/02/15 1234 09/03/15 0745   09/02/15 1400  vancomycin (VANCOCIN) IVPB 1000 mg/200 mL premix     1,000 mg 200 mL/hr over 60 Minutes Intravenous  Once 09/02/15 1234  09/02/15 1430   09/02/15 0900  imipenem-cilastatin (PRIMAXIN) 500 mg in sodium chloride 0.9 % 100 mL IVPB  Status:  Discontinued     500 mg 200 mL/hr over 30 Minutes Intravenous Every 8 hours 09/02/15 0814 09/03/15 0749   08/30/15 0400  vancomycin (VANCOCIN) 1,250 mg in sodium chloride 0.9 % 250 mL IVPB  Status:  Discontinued     1,250 mg 166.7 mL/hr over 90 Minutes Intravenous To Surgery 08/29/15 0611 08/29/15 1012   08/30/15 0400  cefUROXime (ZINACEF) 1.5 g in dextrose 5 % 50 mL IVPB  Status:  Discontinued     1.5 g 100 mL/hr over 30 Minutes Intravenous To Surgery 08/29/15 0611 08/29/15 1012   08/30/15 0400  fluconazole (DIFLUCAN) IVPB 400 mg  Status:  Discontinued     400 mg 100 mL/hr over 120 Minutes Intravenous To Surgery 08/29/15 0611 08/29/15 1012   08/30/15 0400  rifampin (RIFADIN) 600 mg in sodium chloride 0.9 % 100 mL IVPB  Status:  Discontinued     600 mg 200 mL/hr over 30 Minutes Intravenous To Surgery 08/29/15 0611 08/29/15 1012   08/29/15 0845  vancomycin (VANCOCIN) 1,000 mg in sodium chloride 0.9 % 1,000 mL irrigation  Status:  Discontinued      Irrigation To Surgery 08/29/15 0834 08/29/15 1012   08/29/15 0615  cefUROXime (ZINACEF) 750 mg in dextrose 5 % 50 mL IVPB  Status:  Discontinued     750 mg 100 mL/hr over 30 Minutes Intravenous To Surgery 08/29/15 0609 08/29/15 1012   08/29/15 0615  vancomycin (VANCOCIN) powder 1,000 mg     1,000 mg Other To Surgery 08/29/15 0609 08/29/15 0900   08/27/15 2300  vancomycin (VANCOCIN) 50 mg/mL oral solution 125 mg  Status:  Discontinued     125 mg Oral Every 6 hours 08/26/15 2200 08/27/15 0807   08/27/15 1601  vancomycin (VANCOCIN) 1,000 mg in sodium chloride 0.9 % 1,000 mL irrigation  Status:  Discontinued       As needed 08/27/15 1601 08/27/15 1740   08/27/15 1500  vancomycin (VANCOCIN) 1,000 mg in sodium chloride 0.9 % 1,000 mL irrigation  Status:  Discontinued      Irrigation To Surgery 08/27/15 1449 08/27/15 1748   08/27/15 1400   rifampin (RIFADIN) capsule 300 mg     300 mg Oral Every 8 hours 08/27/15 1141     08/27/15 1100  vancomycin (VANCOCIN) 50 mg/mL oral solution 125 mg     125 mg Oral 4 times daily 08/27/15 1052 09/10/15 0959   08/26/15 2145  nafcillin 2 g in dextrose 5 % 100 mL IVPB  Status:  Discontinued     2 g 200 mL/hr over 30 Minutes Intravenous Every 4 hours 08/26/15 2104 09/02/15 0850      Assessment/Plan: s/p Procedure(s): DEBRIDEMENT AND CLOSURE WOUND WITH PLACEMENT OF ABRA CLOSURE DEVICE (N/A) Will plan for dressing change and adjustment of Abra in am  LOS: 19 days    Gared Gillie,PA-C Plastic Surgery [phone]

## 2015-09-15 DIAGNOSIS — K922 Gastrointestinal hemorrhage, unspecified: Secondary | ICD-10-CM

## 2015-09-15 LAB — GLUCOSE, CAPILLARY
GLUCOSE-CAPILLARY: 103 mg/dL — AB (ref 65–99)
GLUCOSE-CAPILLARY: 96 mg/dL (ref 65–99)
GLUCOSE-CAPILLARY: 84 mg/dL (ref 65–99)
GLUCOSE-CAPILLARY: 90 mg/dL (ref 65–99)
Glucose-Capillary: 101 mg/dL — ABNORMAL HIGH (ref 65–99)
Glucose-Capillary: 103 mg/dL — ABNORMAL HIGH (ref 65–99)

## 2015-09-15 LAB — HEPATIC FUNCTION PANEL
ALBUMIN: 1.6 g/dL — AB (ref 3.5–5.0)
ALK PHOS: 178 U/L — AB (ref 38–126)
ALT: 22 U/L (ref 17–63)
AST: 71 U/L — ABNORMAL HIGH (ref 15–41)
BILIRUBIN TOTAL: 7.1 mg/dL — AB (ref 0.3–1.2)
Bilirubin, Direct: 4.2 mg/dL — ABNORMAL HIGH (ref 0.1–0.5)
Indirect Bilirubin: 2.9 mg/dL — ABNORMAL HIGH (ref 0.3–0.9)
Total Protein: 6 g/dL — ABNORMAL LOW (ref 6.5–8.1)

## 2015-09-15 LAB — CARBOXYHEMOGLOBIN
Carboxyhemoglobin: 2.3 % — ABNORMAL HIGH (ref 0.5–1.5)
Methemoglobin: 1.2 % (ref 0.0–1.5)
O2 SAT: 63 %
TOTAL HEMOGLOBIN: 11.1 g/dL — AB (ref 13.5–18.0)

## 2015-09-15 LAB — RENAL FUNCTION PANEL
Albumin: 1.5 g/dL — ABNORMAL LOW (ref 3.5–5.0)
Anion gap: 10 (ref 5–15)
BUN: 40 mg/dL — ABNORMAL HIGH (ref 6–20)
CO2: 25 mmol/L (ref 22–32)
Calcium: 8.4 mg/dL — ABNORMAL LOW (ref 8.9–10.3)
Chloride: 104 mmol/L (ref 101–111)
Creatinine, Ser: 5.11 mg/dL — ABNORMAL HIGH (ref 0.61–1.24)
GFR calc Af Amer: 14 mL/min — ABNORMAL LOW (ref >60)
GFR calc non Af Amer: 12 mL/min — ABNORMAL LOW (ref >60)
Glucose, Bld: 110 mg/dL — ABNORMAL HIGH (ref 65–99)
Phosphorus: 3.4 mg/dL (ref 2.5–4.6)
Potassium: 3.6 mmol/L (ref 3.5–5.1)
Sodium: 139 mmol/L (ref 135–145)

## 2015-09-15 LAB — TYPE AND SCREEN
ABO/RH(D): O POS
Antibody Screen: NEGATIVE
Unit division: 0

## 2015-09-15 LAB — CBC
HCT: 26.1 % — ABNORMAL LOW (ref 39.0–52.0)
Hemoglobin: 8.5 g/dL — ABNORMAL LOW (ref 13.0–17.0)
MCH: 26.5 pg (ref 26.0–34.0)
MCHC: 32.6 g/dL (ref 30.0–36.0)
MCV: 81.3 fL (ref 78.0–100.0)
Platelets: 201 10*3/uL (ref 150–400)
RBC: 3.21 MIL/uL — ABNORMAL LOW (ref 4.22–5.81)
RDW: 19.9 % — ABNORMAL HIGH (ref 11.5–15.5)
WBC: 17 10*3/uL — ABNORMAL HIGH (ref 4.0–10.5)

## 2015-09-15 LAB — C DIFFICILE QUICK SCREEN W PCR REFLEX
C DIFFICLE (CDIFF) ANTIGEN: NEGATIVE
C Diff interpretation: NEGATIVE
C Diff toxin: NEGATIVE

## 2015-09-15 LAB — HEPARIN LEVEL (UNFRACTIONATED): Heparin Unfractionated: <0.1 IU/mL — ABNORMAL LOW (ref 0.30–0.70)

## 2015-09-15 LAB — LACTATE DEHYDROGENASE: LDH: 283 U/L — AB (ref 98–192)

## 2015-09-15 MED ORDER — POTASSIUM CHLORIDE 10 MEQ/50ML IV SOLN
10.0000 meq | INTRAVENOUS | Status: AC
  Administered 2015-09-15 (×3): 10 meq via INTRAVENOUS
  Filled 2015-09-15: qty 50

## 2015-09-15 MED ORDER — HYDRALAZINE HCL 20 MG/ML IJ SOLN
10.0000 mg | INTRAMUSCULAR | Status: DC | PRN
  Administered 2015-09-15 – 2015-09-22 (×5): 10 mg via INTRAVENOUS
  Filled 2015-09-15 (×6): qty 1

## 2015-09-15 MED ORDER — HYDRALAZINE HCL 50 MG PO TABS: 50.0000 mg | ORAL_TABLET | Freq: Three times a day (TID) | ORAL | Status: DC

## 2015-09-15 MED ORDER — HYDRALAZINE HCL 20 MG/ML IJ SOLN
10.0000 mg | Freq: Three times a day (TID) | INTRAMUSCULAR | Status: DC
  Administered 2015-09-15 – 2015-09-16 (×3): 10 mg via INTRAVENOUS
  Filled 2015-09-15 (×2): qty 1

## 2015-09-15 NOTE — Progress Notes (Signed)
ANTICOAGULATION CONSULT NOTE - Follow Up Consult  Pharmacy Consult for Heparin Indication: LVAD  Allergies  Allergen Reactions  . Phytonadione Other (See Comments)    Patient has LVAD: please check with LVAD coordinator on call or LVAD MD on call before reversal of anticoagulation with vit k    Patient Measurements: Height: 5\' 7"  (170.2 cm) Weight: 179 lb 3.7 oz (81.3 kg) IBW/kg (Calculated) : 66.1 Heparin Dosing Weight:  85 kg  Vital Signs: Temp: 98.6 F (37 C) (05/28 0804) Temp Source: Oral (05/28 0804) BP: 162/99 mmHg (05/28 1000) Pulse Rate: 75 (05/28 1000)  Labs:  Recent Labs  09/13/15 0455 09/13/15 1608 09/14/15 0515 09/15/15 0414  HGB 8.9* 9.5* 7.8* 8.5*  HCT 26.7* 28.0* 23.4* 26.1*  PLT 180  --  164 201  HEPARINUNFRC <0.10*  --  <0.10* <0.10*  CREATININE 5.63* 2.70* 4.02* 5.11*    Estimated Creatinine Clearance: 18.3 mL/min (by C-G formula based on Cr of 5.11).  Assessment: 20 yom on Coumadin PTA for his LVAD, admitted with driveline abscess, sternal wound infection, and MSSA bacteremia. Home Coumadin held, and heparin bridge started 5/10 pending OR trips for wound debridement.   Heparin remains < 0.1 and bleeding continues to be an issue so MD has asked that rate not be increased. Will need to discuss rate adjustment and recommendations with Dr. Darcey Nora. S/p possible bleeding 5/24 with drop H/H received 2uts PRBC. Restarted heparin gtt 5/25 afternoon at 1000 unit/hr  - 5/28: HL remains <0.1 this am, per PVT continue at this dose; Hgb 9.5>7.8>8.5. Transfused 5/27   Goal of Therapy:  Heparin level 0.3-0.4 eventually when more stable Monitor platelets by anticoagulation protocol: Yes   Plan:  - Continue heparin at 1000 units/hr and monitor for bleeding - No further up titration of heparin even with low HL - Daily heparin level to monitor for elevations and CBC   Abbagail Scaff S. Alford Highland, PharmD, [facility] Clinical Staff Pharmacist Pager [phone]   09/15/2015  10:43 AM

## 2015-09-15 NOTE — Consult Note (Signed)
[place] cross-covering for Dr. Adriana Mccallum   Referring Provider: Pierre Bali, MD Primary Care Physician:  Sandi Mariscal, MD Primary Gastroenterologist:  Dr. Collene Mares  Reason for Consultation:  Hematemesis  HPI: [name] is a 45 y.o. male amitted from [facility] in [place] with driveline abscess on 5/8.  Blood cultures --> MSSA and has C diff. Remains on Nafcillinand oral vancomycin.   08/27/15 S/P I &D Epigastric with hardware exposed and VAC placement.  5/11 repeat I&D and wound vac replacement.  5/11 TEE no vegetations on valves or ICD wire 5/22 S/P ABRA and Acell   Continues with severe nausea. Unable to take anything po without throwing up. Continues to throw up pebble-sized well formed clots of dark blood. Able to take feeds post-pylorically without problem. On IV protonix BID and zofran, KUB ok.   No fevers. Making about 30-40cc urine per hour but creatinine climbing. Renal plans HD tomorrow just for clearance sake (no fluid off).  Hgb 7.8 grams yesterday to-> 8.5 grams today after another unit RBCs yesterday.  Plastics following patient for wound.  GI consulted for nausea/vomiting/hematemesis.  He is on heparin gtt and will need coumadin eventually.  He says that the nausea and vomiting has been present for the past 3-4 days and the bleeding began to occur 1 or 2 days ago.  Symptoms brought on by taking anything, even a drink of water PO, but he is tolerating his post-pyloric tube feeds at goal without issue.  Does not know of any dark or blood stools.  Is moving his bowels and is "runny".  Cdiff negative today.  Diffuse sore in abdomen and feels "queasy".  EGD 04/2010 by Dr. Collene Mares was normal. Colonoscopy 04/2010 by Dr. Ardis Hughs for Dr. Collene Mares showed diverticular changes throughout the entire colon.  Past Medical History  Diagnosis Date  . Chronic systolic heart failure (HCC)     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%)  . Atrial fibrillation -parosysmal       Rx w amiodarone  . Noncompliance     H/O  MEDICAL NONCOMPLIANCE  . 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 Tricuspid valve regurgitation     SEVERE  . Severe mitral regurgitation   . Polymorphic ventricular tachycardia (HCC)     RECURRENT WITH APPROPRIATE SHOCK THERAPY IN THE PAST  . Ventricular fibrillation (HCC)     WITH APPROPRIATE SHOCK THERAPY IN THE PAST  . Hypertension   . Gout   . RA (rheumatoid arthritis) ([place])   . Automatic implantable cardiac defibrillator -BSX     single chamber  . GI bleed -massive     11 Units 2012  . Elevated LFTs   . H/O hyperthyroidism   . CHF (congestive heart failure) ([place])   . AKI (acute kidney injury) ([place])   . AICD (automatic cardioverter/defibrillator) present     Past Surgical History  Procedure Laterality Date  . Cardiac catheterization  06/2006    RIGHT HEART CATH SHOWING SEVERE BIVENTRICUALR CHF WITH MARKED FILLING AND PRESSURES  . Insert / replace / remove pacemaker      GUIDANT HE ICD MODEL 2180, SERIAL # [id number]  . Cholecystectomy    . Implantable cardioverter defibrillator generator change N/A 07/01/2011    Procedure: IMPLANTABLE CARDIOVERTER DEFIBRILLATOR GENERATOR CHANGE;  Surgeon: Deboraha Sprang, MD;  Location: [facility] CATH LAB;  Service: Cardiovascular;  Laterality: N/A;  . Tee without cardioversion N/A 12/12/2014    Procedure: TRANSESOPHAGEAL ECHOCARDIOGRAM (TEE);  Surgeon: Jerline Pain, MD;  Location: [place];  Service: Cardiovascular;  Laterality: N/A;  . Cardiac catheterization N/A 12/14/2014    Procedure: Right Heart Cath;  Surgeon: Jolaine Artist, MD;  Location: [place] CV LAB;  Service: Cardiovascular;  Laterality: N/A;  . Cardiac catheterization N/A 12/14/2014    Procedure: IABP Insertion;  Surgeon: Jolaine Artist, MD;  Location: [place] CV LAB;  Service: Cardiovascular;  Laterality: N/A;  . Insertion of implantable left ventricular assist device N/A 12/20/2014     Procedure: INSERTION OF IMPLANTABLE LEFT VENTRICULAR ASSIST DEVICE;  Surgeon: Ivin Poot, MD;  Location: [place];  Service: Open Heart Surgery;  Laterality: N/A;  CIRC ARREST  NITRIC OXIDE  . Tee without cardioversion N/A 12/20/2014    Procedure: TRANSESOPHAGEAL ECHOCARDIOGRAM (TEE);  Surgeon: Ivin Poot, MD;  Location: [place];  Service: Open Heart Surgery;  Laterality: N/A;  . Sternal wound debridement N/A 08/27/2015    Procedure: WOUND IRRIGATION AND DEBRIDEMENT;  Surgeon: Ivin Poot, MD;  Location: [place];  Service: Thoracic;  Laterality: N/A;  . Application of wound vac N/A 08/27/2015    Procedure: APPLICATION OF WOUND VAC;  Surgeon: Ivin Poot, MD;  Location: [place];  Service: Thoracic;  Laterality: N/A;  . Sternal wound debridement N/A 08/29/2015    Procedure: DEBRIDEMENT OF chest wound;  Surgeon: Ivin Poot, MD;  Location: [place];  Service: Thoracic;  Laterality: N/A;  . Application of wound vac N/A 08/29/2015    Procedure: WOUND VAC CHANGE;  Surgeon: Ivin Poot, MD;  Location: [place];  Service: Thoracic;  Laterality: N/A;  . Incision and drainage of wound N/A 09/05/2015    Procedure: IRRIGATION AND DEBRIDEMENT sternal WOUND;  Surgeon: Loel Lofty Dillingham, DO;  Location: [place];  Service: Plastics;  Laterality: N/A;  . Application of a-cell of extremity N/A 09/05/2015    Procedure: APPLICATION OF A-CELL OF sternum;  Surgeon: Loel Lofty Dillingham, DO;  Location: [place];  Service: Plastics;  Laterality: N/A;  . Application of wound vac N/A 09/05/2015    Procedure: APPLICATION OF WOUND VAC to sternum;  Surgeon: Loel Lofty Dillingham, DO;  Location: [place];  Service: Plastics;  Laterality: N/A;  . Insertion of dialysis catheter Right 09/05/2015    Procedure: INSERTION OF DIALYSIS CATHETER;RIGHT SUBCLAVIAN;  Surgeon: Loel Lofty Dillingham, DO;  Location: [place];  Service: Plastics;  Laterality: Right;  . Pectoralis flap N/A 09/09/2015    Procedure: DEBRIDEMENT AND CLOSURE WOUND WITH  PLACEMENT OF ABRA CLOSURE DEVICE;  Surgeon: Loel Lofty Dillingham, DO;  Location: [place];  Service: Plastics;  Laterality: N/A;    Prior to Admission medications   Medication Sig Start Date End Date Taking? Authorizing Provider  allopurinol (ZYLOPRIM) 300 MG tablet Take 1 tablet (300 mg total) by mouth daily. 01/08/15  Yes Amy D Clegg, NP  amiodarone (PACERONE) 200 MG tablet Take 0.5 tablets (100 mg total) by mouth daily. 05/16/15  Yes Larey Dresser, MD  aspirin EC 81 MG tablet Take 81 mg by mouth daily.   Yes Historical Provider, MD  citalopram (CELEXA) 20 MG tablet Take 1 tablet (20 mg total) by mouth daily. 08/14/15  Yes Amy D Clegg, NP  colchicine 0.6 MG tablet Take 1 tablet (0.6 mg total) by mouth 2 (two) times daily as needed (as needed for gout pain). 01/14/15  Yes Jolaine Artist, MD  digoxin (LANOXIN) 0.125 MG tablet Take 1 tablet (0.125 mg total) by mouth daily.  08/14/15  Yes Amy D Clegg, NP  ferrous sulfate 325 (65 FE) MG tablet Take 1 tablet (325 mg total) by mouth 2 (two) times daily with a meal. 05/22/15  Yes Jolaine Artist, MD  furosemide (LASIX) 40 MG tablet Take 40 mg by mouth 2 (two) times a week. 07/10/15  Yes Historical Provider, MD  hydrALAZINE (APRESOLINE) 100 MG tablet Take 1 tablet (100 mg total) by mouth every 8 (eight) hours. 05/21/15  Yes Amy D Clegg, NP  lisinopril (PRINIVIL,ZESTRIL) 20 MG tablet Take 1 tablet (20 mg total) by mouth daily. 07/10/15  Yes Larey Dresser, MD  pantoprazole (PROTONIX) 40 MG tablet Take 1 tablet (40 mg total) by mouth daily. 08/12/15  Yes Amy D Clegg, NP  potassium chloride SA (K-DUR,KLOR-CON) 20 MEQ tablet Take 40 mEq by mouth 2 (two) times a week. 08/26/15  Yes Historical Provider, MD  pregabalin (LYRICA) 75 MG capsule Take 1 capsule (75 mg total) by mouth 2 (two) times daily. 08/06/15  Yes Deboraha Sprang, MD  sildenafil (REVATIO) 20 MG tablet Take 1 tablet (20 mg total) by mouth 3 (three) times daily. 01/08/15  Yes Amy D Clegg, NP  spironolactone  (ALDACTONE) 25 MG tablet Take 0.5 tablets (12.5 mg total) by mouth daily. 01/14/15  Yes Jolaine Artist, MD  vancomycin (VANCOCIN) 125 MG capsule Take by mouth.  08/26/15  Yes Historical Provider, MD  warfarin (COUMADIN) 2 MG tablet Take 4 mg daily except 6 mg on Saturdays: may adjust just as needed per [place] [facility] Patient taking differently: Take 4 mg by mouth daily at 6 PM.  05/21/15  Yes Amy D Clegg, NP    Current Facility-Administered Medications  Medication Dose Route Frequency Provider Last Rate Last Dose  . 0.9 %  sodium chloride infusion  250 mL Intravenous PRN Conrad Easton, NP 0 mL/hr at 09/05/15 1530    . 0.9 %  sodium chloride infusion  100 mL Intravenous PRN Rexene Agent, MD      . 0.9 %  sodium chloride infusion  100 mL Intravenous PRN Rexene Agent, MD      . 0.9 %  sodium chloride infusion   Intravenous Once Rexene Agent, MD 10 mL/hr at 09/14/15 0400    . allopurinol (ZYLOPRIM) tablet 100 mg  100 mg Oral Daily Jolaine Artist, MD   100 mg at 09/15/15 0913  . alteplase (CATHFLO ACTIVASE) injection 2 mg  2 mg Intracatheter Once PRN Rexene Agent, MD      . bisacodyl (DULCOLAX) EC tablet 10 mg  10 mg Oral Daily PRN Amy Estrella Deeds, NP       Or  . bisacodyl (DULCOLAX) suppository 10 mg  10 mg Rectal Daily PRN Amy D Clegg, NP      . ceFAZolin (ANCEF) IVPB 1 g/50 mL premix  1 g Intravenous Q24H Ivin Poot, MD   1 g at 09/14/15 2010  . citalopram (CELEXA) tablet 20 mg  20 mg Oral Daily Amy D Clegg, NP   20 mg at 09/15/15 0913  . dextrose 5 %-0.9 % sodium chloride infusion   Intravenous Continuous Ivin Poot, MD 30 mL/hr at 09/14/15 0400    . docusate sodium (COLACE) capsule 200 mg  200 mg Oral Daily Amy D Clegg, NP   200 mg at 09/11/15 1017  . feeding supplement (NEPRO CARB STEADY) liquid 1,000 mL  1,000 mL Per Tube Continuous Jolaine Artist, MD 45 mL/hr at 09/14/15  1958 1,000 mL at 09/14/15 1958  . feeding supplement (PRO-STAT SUGAR FREE 64) liquid 30 mL  30 mL Per  Tube TID Jolaine Artist, MD   30 mL at 09/15/15 0914  . fentaNYL (SUBLIMAZE) injection 25 mcg  25 mcg Intravenous Q4H PRN Ivin Poot, MD   25 mcg at 09/12/15 [phone]  . gelatin adsorbable (GELFOAM/SURGIFOAM) sponge 12-7 mm 2 each  2 each Topical BID PRN Gaye Pollack, MD      . Gerhardt's butt cream   Topical PRN Ivin Poot, MD      . guaiFENesin [facility]) 12 hr tablet 600 mg  600 mg Oral BID Ivin Poot, MD   600 mg at 09/15/15 0913  . heparin ADULT infusion 100 units/mL (25000 units/28mL sodium chloride 0.45%)  1,000 Units/hr Intravenous Continuous Jolaine Artist, MD 10 mL/hr at 09/15/15 0600 1,000 Units/hr at 09/15/15 0600  . hydrALAZINE (APRESOLINE) injection 10 mg  10 mg Intravenous Q4H PRN Jolaine Artist, MD      . hydrALAZINE (APRESOLINE) injection 10 mg  10 mg Intravenous Q8H Jolaine Artist, MD      . magic mouthwash  10 mL Oral TID Ivin Poot, MD   10 mL at 09/15/15 0913  . metoCLOPramide (REGLAN) injection 5 mg  5 mg Intravenous Q6H Jolaine Artist, MD   5 mg at 09/15/15 0542  . ondansetron (ZOFRAN) injection 4 mg  4 mg Intravenous Q4H PRN Amy D Clegg, NP   4 mg at 09/14/15 2346  . oxyCODONE (Oxy IR/ROXICODONE) immediate release tablet 5 mg  5 mg Oral Q3H PRN Ivin Poot, MD   5 mg at 09/13/15 2228  . pantoprazole (PROTONIX) injection 40 mg  40 mg Intravenous Q12H Jolaine Artist, MD   40 mg at 09/15/15 0914  . potassium chloride 10 mEq in 50 mL *CENTRAL LINE* IVPB  10 mEq Intravenous Q1 Hr x 3 Shaune Pascal [name], MD      . pregabalin (LYRICA) capsule 75 mg  75 mg Oral Daily Jolaine Artist, MD   75 mg at 09/15/15 0913  . rifampin (RIFADIN) capsule 300 mg  300 mg Oral Q8H Jolaine Artist, MD   300 mg at 09/15/15 0543  . sildenafil (REVATIO) tablet 20 mg  20 mg Oral Q8H Amy D Clegg, NP   20 mg at 09/15/15 0543  . simethicone (MYLICON) chewable tablet 80 mg  80 mg Oral QID Ivin Poot, MD   80 mg at 09/15/15 0913  . sodium chloride flush  (NS) 0.9 % injection 10-40 mL  10-40 mL Intracatheter Q12H Jolaine Artist, MD   10 mL at 09/15/15 0917  . sodium chloride flush (NS) 0.9 % injection 10-40 mL  10-40 mL Intracatheter PRN Jolaine Artist, MD   40 mL at 09/07/15 2218  . sodium chloride flush (NS) 0.9 % injection 3 mL  3 mL Intravenous Q12H Amy D Clegg, NP   3 mL at 09/13/15 0904  . sodium chloride flush (NS) 0.9 % injection 3 mL  3 mL Intravenous PRN Amy D Clegg, NP      . tamsulosin (FLOMAX) capsule 0.4 mg  0.4 mg Oral Daily Jolaine Artist, MD   0.4 mg at 09/15/15 0913  . traMADol (ULTRAM) tablet 50 mg  50 mg Oral Q6H PRN Jolaine Artist, MD   50 mg at 09/06/15 0956  . vancomycin (VANCOCIN) 50 mg/mL oral solution 125 mg  125  mg Oral Q8H Jolaine Artist, MD   125 mg at 09/15/15 0543    Allergies as of 08/26/2015 - Review Complete 08/26/2015  Allergen Reaction Noted  . Phytonadione Other (See Comments) 08/26/2015    Family History  Problem Relation Age of Onset  . Heart failure Brother     Social History   Social History  . Marital Status: Single    Spouse Name: N/A  . Number of Children: N/A  . Years of Education: N/A   Occupational History  . [employer]   Social History Main Topics  . Smoking status: Never Smoker   . Smokeless tobacco: Never Used  . Alcohol Use: No  . Drug Use: No  . Sexual Activity: Not on file   Other Topics Concern  . Not on file   Social History Narrative   LIVES IN [place]   SINGLE   TOBACCO USE .Marland KitchenMarland KitchenNO   ALCOHOL USE.Marland Kitchen NO   REGULAR EXERCISE.Marland Kitchen NO   DRUG USE... NO    Review of Systems: Ten point ROS is O/W negative except as mentioned in HPI.  Physical Exam: Vital signs in last 24 hours: Temp:  [98.3 F (36.8 C)-99.4 F (37.4 C)] 98.6 F (37 C) (05/28 0804) Pulse Rate:  [75-89] 75 (05/28 1000) Resp:  [14-21] 14 (05/28 1000) BP: (126-164)/(83-101) 162/99 mmHg (05/28 1000) SpO2:  [98 %-100 %] 100 % (05/28 1000) Weight:  [179 lb 3.7 oz  (81.3 kg)] 179 lb 3.7 oz (81.3 kg) (05/28 0500) Last BM Date: 09/14/15 General:  Alert, Well-developed, well-nourished, pleasant and cooperative in NAD Head:  Normocephalic and atraumatic. Eyes:  Scleral icterus present. Ears:  Normal auditory acuity. Mouth:  No deformity or lesions.   Lungs:  Clear throughout to auscultation.  No wheezes, crackles, or rhonchi.  Heart:  LVAD hum noted. Abdomen:  Soft, slightly distended.  Dressing noted on chest and epigastrium.  BS present but quiet.  Mild diffuse TTP. Rectal:  Deferred.  Msk:  Symmetrical without gross deformities. Pulses:  Normal pulses noted. Extremities:  Without clubbing or edema. Neurologic:  Alert and oriented x 4;  grossly normal neurologically. Skin:  Intact without significant lesions or rashes. Psych:  Alert and cooperative. Normal mood and affect.  Intake/Output from previous day: 05/27 0701 - 05/28 0700 In: 3120 [P.O.:180; I.V.:1190; Blood:335; NG/GT:1215; IV Piggyback:200] Out: 890 [Urine:890] Intake/Output this shift: Total I/O In: 10 [I.V.:10] Out: 200 [Urine:200]  Lab Results:  Recent Labs  09/13/15 0455 09/13/15 1608 09/14/15 0515 09/15/15 0414  WBC 19.2*  --  16.2* 17.0*  HGB 8.9* 9.5* 7.8* 8.5*  HCT 26.7* 28.0* 23.4* 26.1*  PLT 180  --  164 201   BMET  Recent Labs  09/13/15 0455 09/13/15 1608 09/14/15 0515 09/15/15 0414  NA 135 140 138 139  K 3.5 3.6 3.4* 3.6  CL 100* 100* 102 104  CO2 26  --  27 25  GLUCOSE 108* 173* 92 110*  BUN 38* 16 25* 40*  CREATININE 5.63* 2.70* 4.02* 5.11*  CALCIUM 8.2*  --  8.4* 8.4*   LFT  Recent Labs  09/14/15 0515 09/15/15 0414  PROT 6.0*  --   ALBUMIN 1.5* 1.5*  AST 74*  --   ALT 30  --   ALKPHOS 161*  --   BILITOT 7.5*  --    Studies/Results: Dg Abd Portable 1v  09/14/2015  CLINICAL DATA:  Patient status post feeding tube placement. EXAM: PORTABLE ABDOMEN - 1 VIEW COMPARISON:  Radiograph  09/13/2015. FINDINGS: Weighted feeding tube is present  with tip projecting in the expected location of the fourth portion of the duodenum. LVAD device is present. Nonspecific bowel gas pattern with mildly prominent gaseous distended loops of bowel within the right lower quadrant. Unremarkable osseous skeleton. IMPRESSION: Feeding tube tip appears to project over the fourth portion of the duodenum. Nonspecific bowel-gas pattern with suggestion of gaseous distention of loops of bowel in the right lower quadrant. Electronically Signed   By: Lovey Newcomer M.D.   On: 09/14/2015 18:05   IMPRESSION:  -Nausea and vomiting with hematemesis:  Symptoms brought on my taking anything PO.  Tolerating post-pyloric tube feeds.  ? Ulcer vs esophagitis, etc.  MWT could be cause of hematemesis.  -LVAD MSSA driveline infection:  On IV abx and s/p debridement.  On heparin gtt currently due to LVAD; will need coumadin eventually. -Anemia:  Has received several units of PRBC's this admission. -AKI:  Renal following ans suspects ATN.  For dialysis tomorrow. -Non-ischemic cardiomyopathy supported by LVAD -Elevated LFT's/hyperbilirubinema:  Being followed by primary service for now.  They are considering ultrasound.    PLAN: -Continue empiric PPI IV BID for now.  Anti-emetics. -Monitor Hgb. -Will likely need EGD but due to need for MAC sedation it will likely not be until Tuesday.  Obviously LVAD nurse will need to be present and we will need to hold IV heparin.  ZEHR, JESSICA D.  09/15/2015, 11:38 AM  Pager number [phone]   ________________________________________________________________________  Velora Heckler GI MD note:  I personally examined the patient, reviewed the data and agree with the assessment and plan described above.  Initially he had ileus, possibly from C. Diff colitis (per report from OSH, testing here has been neg) or from MSSA sepsis, device abscess.  Has been tolerating post pyloric feeding for at least 2 weeks. Currently no ileus or dilated bowel on imaging  (plain films today, Ct recently) but he vomits with any PO intake, including water, and has hematemesis.  Multiple meds he takes can cause or contribute to nausea and vomiting including vancomycin, narcotic pain meds, celexa, cefazolin, allopurinal, rifampin.  Should stop any of those unless they are absolutely necessary.  He should have upper endoscopy evaluation, timing to be decided based on MAC availability.  Tuesday?  For now, continue to use post pyloric tube for TFs. I will discuss case with Dr. Adriana Mccallum at signout tomorrow.  I will also change his zofran to scheduled dosing rather than PRN.   Owens Loffler, MD [facility] Gastroenterology Pager [phone]

## 2015-09-15 NOTE — Progress Notes (Addendum)
Patient ID: [name], male   DOB: 1970/11/02, 45 y.o.   MRN: [id number]    HeartMate 2 Rounding Note  Subjective:    Admitted from [facility] in [place] with driveline abscess. Blood cultures --> MSSA and has C diff. Remains on Nafcillin  and oral vancomycin.   08/27/15 S/P I &D Epigastric with hardware exposed and VAC placement.  5/11 repeat I&D and wound vac replacement.  5/11 TEE no vegetations on valves or ICD wire 5/22 S/P ABRA and Acell   Continues with severe nausea. Unable to take anything po without throwing up. Continues to throw up pebble-sized well formed clots of dark blood. Able to take feeds post-pylorically without problem. On IV protonix and zofran, KUB ok.   No fevers. Making about 30-40cc urine per hour but creatinine climbing. Renal plans HD tomorrow just for clearance sake (no fluid off)  Hydralazine restarted yesterday but MAPs still high. hgb up 7.8-> 8.5 after another unit RBCs yesterday. WBC trending up again.    Plastics has seen and redressed wound today.   VAD INTERROGATION:  HeartMate II LVAD:  Flow 4.8  liters/min, speed 9200, power 5.0  PI 7.9    Objective:    Vital Signs:   Temp:  [98.3 F (36.8 C)-99.4 F (37.4 C)] 98.6 F (37 C) (05/28 0804) Pulse Rate:  [75-89] 75 (05/28 1000) Resp:  [14-21] 14 (05/28 1000) BP: (126-164)/(83-101) 162/99 mmHg (05/28 1000) SpO2:  [98 %-100 %] 100 % (05/28 1000) Weight:  [81.3 kg (179 lb 3.7 oz)] 81.3 kg (179 lb 3.7 oz) (05/28 0500) Last BM Date: 09/14/15 Mean arterial Pressure  100-110  Intake/Output:   Intake/Output Summary (Last 24 hours) at 09/15/15 1023 Last data filed at 09/15/15 1000  Gross per 24 hour  Intake   2585 ml  Output    900 ml  Net   1685 ml     Physical Exam: CVP ~8 General: In chair. NAD HEENT: Scleral icterus +panda Neck: supple. Carotids 2+ bilat; no bruits. No lymphadenopathy or thryomegaly appreciated. R subclavian trialysis cath dressing dry Cor:  Mechanical heart sounds with LVAD hum present. Lungs: clear on  Abdomen: soft, tender,  + distended. + bowel sounds. Mid epigastric dressing ok.   Driveline: C/D/I; securement device intact and driveline incorporated Extremities: no cyanosis, clubbing, rash, trace edema  Neuro: A & O x3  Telemetry: NSR 80s   Labs: Basic Metabolic Panel:  Recent Labs Lab 09/08/15 1201  09/10/15 0155  09/11/15 0820 09/12/15 0245 09/13/15 0455 09/13/15 1608 09/14/15 0515 09/14/15 0906 09/15/15 0414  NA 135  < > 136  < > 136 135 135 140 138  --  139  K 3.0*  < > 3.0*  < > 3.2* 3.2* 3.5 3.6 3.4*  --  3.6  CL 102  < > 100*  < > 103 99* 100* 100* 102  --  104  CO2 22  --  26  < > 22 27 26   --  27  --  25  GLUCOSE 105*  < > 101*  < > 88 92 108* 173* 92  --  110*  BUN 53*  < > 32*  < > 50* 18 38* 16 25*  --  40*  CREATININE 7.03*  < > 5.54*  < > 6.76* 3.50* 5.63* 2.70* 4.02*  --  5.11*  CALCIUM 8.5*  --  8.1*  < > 8.1* 7.9* 8.2*  --  8.4*  --  8.4*  MG  --   --  2.3  --  2.7*  --   --   --   --  2.2  --   PHOS 5.8*  --   --   --   --   --   --   --   --   --  3.4  < > = values in this interval not displayed.  Liver Function Tests:  Recent Labs Lab 09/10/15 0155 09/11/15 0515 09/12/15 0245 09/13/15 0455 09/14/15 0515 09/15/15 0414  AST 61* 33 78* 95* 74*  --   ALT 15* 13* 25 37 30  --   ALKPHOS 112 78 168* 136* 161*  --   BILITOT 8.6* 5.3* 8.8* 9.5* 7.5*  --   PROT 5.7* 3.2* 6.2* 5.9* 6.0*  --   ALBUMIN 1.5* <1.0* 1.6* 1.5* 1.5* 1.5*   No results for input(s): LIPASE, AMYLASE in the last 168 hours. No results for input(s): AMMONIA in the last 168 hours.  CBC:  Recent Labs Lab 09/12/15 0245 09/12/15 0930 09/13/15 0455 09/13/15 1608 09/14/15 0515 09/15/15 0414  WBC 16.5* 17.1* 19.2*  --  16.2* 17.0*  NEUTROABS  --   --   --   --  13.7*  --   HGB 9.9* 9.4* 8.9* 9.5* 7.8* 8.5*  HCT 28.8* 28.0* 26.7* 28.0* 23.4* 26.1*  MCV 78.7 78.9 79.9  --  81.5 81.3  PLT 182 178 180  --  164  201    INR: No results for input(s): INR in the last 168 hours.  Other results:    Imaging: Dg Abd Portable 1v  09/14/2015  CLINICAL DATA:  Patient status post feeding tube placement. EXAM: PORTABLE ABDOMEN - 1 VIEW COMPARISON:  Radiograph 09/13/2015. FINDINGS: Weighted feeding tube is present with tip projecting in the expected location of the fourth portion of the duodenum. LVAD device is present. Nonspecific bowel gas pattern with mildly prominent gaseous distended loops of bowel within the right lower quadrant. Unremarkable osseous skeleton. IMPRESSION: Feeding tube tip appears to project over the fourth portion of the duodenum. Nonspecific bowel-gas pattern with suggestion of gaseous distention of loops of bowel in the right lower quadrant. Electronically Signed   By: Lovey Newcomer M.D.   On: 09/14/2015 18:05     Medications:     Scheduled Medications: . sodium chloride   Intravenous Once  . allopurinol  100 mg Oral Daily  . amiodarone  100 mg Oral Daily  .  ceFAZolin (ANCEF) IV  1 g Intravenous Q24H  . citalopram  20 mg Oral Daily  . docusate sodium  200 mg Oral Daily  . feeding supplement (PRO-STAT SUGAR FREE 64)  30 mL Per Tube TID  . ferrous sulfate  325 mg Oral BID WC  . guaiFENesin  600 mg Oral BID  . hydrALAZINE  25 mg Oral Q8H  . magic mouthwash  10 mL Oral TID  . metoCLOPramide (REGLAN) injection  5 mg Intravenous Q6H  . pantoprazole (PROTONIX) IV  40 mg Intravenous Q12H  . pregabalin  75 mg Oral Daily  . rifampin  300 mg Oral Q8H  . sildenafil  20 mg Oral Q8H  . simethicone  80 mg Oral QID  . sodium chloride flush  10-40 mL Intracatheter Q12H  . sodium chloride flush  3 mL Intravenous Q12H  . tamsulosin  0.4 mg Oral Daily  . vancomycin  125 mg Oral Q8H    Infusions: . dextrose 5 % and 0.9% NaCl 30 mL/hr at 09/14/15 0400  . feeding  supplement (NEPRO CARB STEADY) 1,000 mL (09/14/15 1958)  . heparin 1,000 Units/hr (09/15/15 0600)    PRN Medications: sodium  chloride, sodium chloride, sodium chloride, alteplase, bisacodyl **OR** bisacodyl, fentaNYL (SUBLIMAZE) injection, gelatin adsorbable, Gerhardt's butt cream, ondansetron, oxyCODONE, sodium chloride flush, sodium chloride flush, traMADol   Assessment:   1. LVAD Complication- Driveline abscess 2. MSSA bacteremia -> septic shock 3. Acute/Chronic systolic HF s/p VAD placement 9/16 4. C. Difficile colitis 5. 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 Acute blood loss anemia 8. Severe Malnutrition.   9.. ARF 10. Klebsiella (ESBL) pneumonia 11. NSVT  12. Hypokalemia  13. RV Failure   Plan/Discussion:    S/P I&D LVAD driveline abscess. Exposed hardware. /22/17 S/P ABRA and reapplication of ACell. Adjustments per Plastics.   WBC back up. Remains on cefazolin for MSSA bacteremia and oral vanc for c.diff. Klebseilla PNA has been treated. ID following. Will provide incentive spirometer as well.   No ectopy overnight. Keep K>=4.0 and Mg>= 2.0. Supp K today.  CO-OX 63%  Tolerating TFs but continues with severe nausea and bloody emesis with well-formed clots. Have asked GI to see. Can stop heparin for short period if needed.  Has RV failure. Maps up again. Continue 20 mg sildenafil. Will change hydralazine to IV today. Continue with scleral icterus. Will recheck bilirubin to see if worsening. LDH ok so doubt hemolysis. Will hold amio. Can consider RUQ u/s.   Urine output improving but renal recovery lagging a bit. Renal following. HD for solute clearance tomorrow,  VAD parameters ok. Hgb stable after transfusion. On low dose heparin. LDH ok. Will start coumadin when ok from surgical standpoint.   PT following. Recommending CIR. CIR following.   D/W Dr. Prescott Gum. GI consulted.     Length of Stay: 20   Glori Bickers MD  09/15/2015, 10:23 AM  VAD Team --- VAD ISSUES ONLY--- Pager [phone] (7am - 7am)  Advanced Heart Failure Team  Pager [phone] (M-F; 7a  - 4p)  Please contact [facility] for night-coverage after hours (4p -7a ) and weekends on [email or web address]

## 2015-09-15 NOTE — Progress Notes (Signed)
Patient ID: [name], male   DOB: 1970/05/22, 45 y.o.   MRN: [id number]  [facility] Progress Note   Assessment/ Plan:   1. AKI:  Suspected to be ATN from sepsis/shock. Improving urine output noted however lagging recovery of renal function-will place tentatively scheduled for dialysis tomorrow to assist with clearance/regulation of metabolic abnormalities (no ultrafiltration so as to limit hemodynamic renal injury). With apparently normal renal function at baseline and recovery anticipated. 2. LVAD MSSA driveline infection/infected VAD pocket: On intravenous antibiotic therapy and now status post debridement and initial step towards wound closure. On oral rifampin. 3. Clostridium difficile colitis: On oral vancomycin, reports significant clinical improvement. Recheck stool for C. difficile toxin/PCR. 4. Hypokalemia: Likely from GI/dialysis associated losses- Place cautiously via intravenous route-currently nothing by mouth with NG tube/suction with ileus. 5. Nonischemic cardiomyopathy/chronic systolic heart failure: supported with LVAD.  Subjective:   With abdominal discomfort/distention overnight and associated nausea    Objective:   BP 160/96 mmHg  Pulse 83  Temp(Src) 98.6 F (37 C) (Oral)  Resp 19  Ht 5\' 7"  (1.702 m)  Wt 81.3 kg (179 lb 3.7 oz)  BMI 28.07 kg/m2  SpO2 100%  Intake/Output Summary (Last 24 hours) at 09/15/15 [id number] Last data filed at 09/15/15 0600  Gross per 24 hour  Intake   3025 ml  Output    890 ml  Net   2135 ml   Weight change: 0.3 kg (10.6 oz)  Physical Exam: [id number] resting in bed, NG tube in situ CVS: Pulse regular rhythm, normal rate Resp: Poor inspiratory effort-decreased breath sounds bilaterally Abd: Distended, slightly tender with high-pitched bowel sounds Ext: Trace lower extremity edema  Imaging: Dg Abd Portable 1v  09/14/2015  CLINICAL DATA:  Patient status post feeding tube placement. EXAM: PORTABLE ABDOMEN - 1  VIEW COMPARISON:  Radiograph 09/13/2015. FINDINGS: Weighted feeding tube is present with tip projecting in the expected location of the fourth portion of the duodenum. LVAD device is present. Nonspecific bowel gas pattern with mildly prominent gaseous distended loops of bowel within the right lower quadrant. Unremarkable osseous skeleton. IMPRESSION: Feeding tube tip appears to project over the fourth portion of the duodenum. Nonspecific bowel-gas pattern with suggestion of gaseous distention of loops of bowel in the right lower quadrant. Electronically Signed   By: Lovey Newcomer M.D.   On: 09/14/2015 18:05    Labs: BMET  Recent Labs Lab 09/08/15 1201  09/10/15 1250  09/11/15 0515 09/11/15 0820 09/12/15 0245 09/13/15 0455 09/13/15 1608 09/14/15 0515 09/15/15 0414  NA 135  < > 135  < > 141 136 135 135 140 138 139  K 3.0*  < > 3.4*  < > <2.0* 3.2* 3.2* 3.5 3.6 3.4* 3.6  CL 102  < > 104  < > 119* 103 99* 100* 100* 102 104  CO2 22  < > 25  --  16* 22 27 26   --  27 25  GLUCOSE 105*  < > 81  < > 84 88 92 108* 173* 92 110*  BUN 53*  < > 39*  < > 31* 50* 18 38* 16 25* 40*  CREATININE 7.03*  < > 5.71*  < > 4.02* 6.76* 3.50* 5.63* 2.70* 4.02* 5.11*  CALCIUM 8.5*  < > 7.8*  --  4.9* 8.1* 7.9* 8.2*  --  8.4* 8.4*  PHOS 5.8*  --   --   --   --   --   --   --   --   --  3.4  < > = values in this interval not displayed. CBC  Recent Labs Lab 09/12/15 0930 09/13/15 0455 09/13/15 1608 09/14/15 0515 09/15/15 0414  WBC 17.1* 19.2*  --  16.2* 17.0*  NEUTROABS  --   --   --  13.7*  --   HGB 9.4* 8.9* 9.5* 7.8* 8.5*  HCT 28.0* 26.7* 28.0* 23.4* 26.1*  MCV 78.9 79.9  --  81.5 81.3  PLT 178 180  --  164 201    Medications:    . sodium chloride   Intravenous Once  . allopurinol  100 mg Oral Daily  . amiodarone  100 mg Oral Daily  .  ceFAZolin (ANCEF) IV  1 g Intravenous Q24H  . citalopram  20 mg Oral Daily  . docusate sodium  200 mg Oral Daily  . feeding supplement (PRO-STAT SUGAR FREE 64)   30 mL Per Tube TID  . ferrous sulfate  325 mg Oral BID WC  . guaiFENesin  600 mg Oral BID  . hydrALAZINE  25 mg Oral Q8H  . magic mouthwash  10 mL Oral TID  . metoCLOPramide (REGLAN) injection  5 mg Intravenous Q6H  . pantoprazole (PROTONIX) IV  40 mg Intravenous Q12H  . pregabalin  75 mg Oral Daily  . rifampin  300 mg Oral Q8H  . sildenafil  20 mg Oral Q8H  . simethicone  80 mg Oral QID  . sodium chloride flush  10-40 mL Intracatheter Q12H  . sodium chloride flush  3 mL Intravenous Q12H  . tamsulosin  0.4 mg Oral Daily  . vancomycin  125 mg Oral Q8H   Elmarie Shiley, MD 09/15/2015, 8:22 AM

## 2015-09-15 NOTE — Progress Notes (Signed)
6 Days Post-Op  Subjective: Patient reports nausea, no emesis. 2 episodes of diarrhea. Not passing much flatus this am and no appetite. Tolerating tube feeds.   ABD wound dressings removed.  Acell powder was applied and covered with Sorbact, 2x2 with sterile surgical lubricant was applied and the area was covered with sterile 2x2's and ABD pad. This was done under sterile techniques and the patient tolerated well. The superior portion of the wound appears to be granulating well, less so inferiorly. The Abra device was tightened further and the wound edges are now in very close approximation ~5 mm.   Objective: Vital signs in last 24 hours: Temp:  [98.3 F (36.8 C)-99.4 F (37.4 C)] 98.6 F (37 C) (05/28 0804) Pulse Rate:  [78-89] 83 (05/28 0800) Resp:  [15-22] 19 (05/28 0800) BP: (126-161)/(83-101) 160/96 mmHg (05/28 0800) SpO2:  [98 %-100 %] 100 % (05/28 0800) Weight:  [81.3 kg (179 lb 3.7 oz)] 81.3 kg (179 lb 3.7 oz) (05/28 0500) Last BM Date: 09/14/15  Intake/Output from previous day: 05/27 0701 - 05/28 0700 In: 3120 [P.O.:180; I.V.:1190; Blood:335; NG/GT:1215; IV Piggyback:200] Out: 890 [Urine:890] Intake/Output this shift:    General appearance: alert, cooperative and mild distress ABD wound improving as noted with increased granulation. No signs of infection. No odor. Dressings with scant serosanguinous drainage.   Lab Results:   Recent Labs  09/14/15 0515 09/15/15 0414  WBC 16.2* 17.0*  HGB 7.8* 8.5*  HCT 23.4* 26.1*  PLT 164 201   BMET  Recent Labs  09/14/15 0515 09/15/15 0414  NA 138 139  K 3.4* 3.6  CL 102 104  CO2 27 25  GLUCOSE 92 110*  BUN 25* 40*  CREATININE 4.02* 5.11*  CALCIUM 8.4* 8.4*   PT/INR No results for input(s): LABPROT, INR in the last 72 hours. ABG No results for input(s): PHART, HCO3 in the last 72 hours.  Invalid input(s): PCO2, PO2  Studies/Results: Dg Abd Portable 1v  09/14/2015  CLINICAL DATA:  Patient status post feeding  tube placement. EXAM: PORTABLE ABDOMEN - 1 VIEW COMPARISON:  Radiograph 09/13/2015. FINDINGS: Weighted feeding tube is present with tip projecting in the expected location of the fourth portion of the duodenum. LVAD device is present. Nonspecific bowel gas pattern with mildly prominent gaseous distended loops of bowel within the right lower quadrant. Unremarkable osseous skeleton. IMPRESSION: Feeding tube tip appears to project over the fourth portion of the duodenum. Nonspecific bowel-gas pattern with suggestion of gaseous distention of loops of bowel in the right lower quadrant. Electronically Signed   By: Lovey Newcomer M.D.   On: 09/14/2015 18:05    Anti-infectives: Anti-infectives    Start     Dose/Rate Route Frequency Ordered Stop   09/11/15 0600  vancomycin (VANCOCIN) 50 mg/mL oral solution 125 mg     125 mg Oral Every 8 hours 09/10/15 2232     09/09/15 2000  ceFAZolin (ANCEF) IVPB 1 g/50 mL premix     1 g 100 mL/hr over 30 Minutes Intravenous Every 24 hours 09/09/15 1239     09/09/15 1011  polymyxin B 500,000 Units, bacitracin 50,000 Units in sodium chloride irrigation 0.9 % 500 mL irrigation  Status:  Discontinued       As needed 09/09/15 1012 09/09/15 1047   09/09/15 0800  ceFAZolin (ANCEF) IVPB 1 g/50 mL premix  Status:  Discontinued     1 g 100 mL/hr over 30 Minutes Intravenous Every 8 hours 09/09/15 0759 09/09/15 1239   09/09/15 0600  ceFAZolin (ANCEF) IVPB 2g/100 mL premix  Status:  Discontinued     2 g 200 mL/hr over 30 Minutes Intravenous To ShortStay Surgical 09/08/15 1415 09/09/15 1049   09/05/15 1426  polymyxin B 500,000 Units, bacitracin 50,000 Units in sodium chloride irrigation 0.9 % 500 mL irrigation  Status:  Discontinued       As needed 09/05/15 1434 09/05/15 1513   09/05/15 1100  cefTRIAXone (ROCEPHIN) 2 g in dextrose 5 % 50 mL IVPB  Status:  Discontinued     2 g 100 mL/hr over 30 Minutes Intravenous Every 24 hours 09/05/15 0948 09/09/15 1238   09/05/15 1045  ceFAZolin  (ANCEF) IVPB 2g/100 mL premix     2 g 200 mL/hr over 30 Minutes Intravenous To ShortStay Surgical 09/04/15 2206 09/05/15 1345   09/04/15 1000  imipenem-cilastatin (PRIMAXIN) 500 mg in sodium chloride 0.9 % 100 mL IVPB  Status:  Discontinued     500 mg 200 mL/hr over 30 Minutes Intravenous Every 12 hours 09/03/15 1817 09/05/15 0948   09/04/15 0600  vancomycin (VANCOCIN) IVPB 1000 mg/200 mL premix  Status:  Discontinued     1,000 mg 200 mL/hr over 60 Minutes Intravenous Every 24 hours 09/03/15 0757 09/03/15 0953   09/03/15 1518  vancomycin (VANCOCIN) 1,000 mg in sodium chloride 0.9 % 1,000 mL irrigation  Status:  Discontinued       As needed 09/03/15 1518 09/03/15 1525   09/03/15 1330  vancomycin (VANCOCIN) 1,000 mg in sodium chloride 0.9 % 1,000 mL irrigation  Status:  Discontinued      Irrigation To Surgery 09/03/15 1325 09/03/15 1628   09/03/15 1200  imipenem-cilastatin (PRIMAXIN) 250 mg in sodium chloride 0.9 % 100 mL IVPB  Status:  Discontinued     250 mg 200 mL/hr over 30 Minutes Intravenous Every 6 hours 09/03/15 0749 09/03/15 1817   09/03/15 0200  vancomycin (VANCOCIN) IVPB 750 mg/150 ml premix  Status:  Discontinued     750 mg 150 mL/hr over 60 Minutes Intravenous Every 12 hours 09/02/15 1234 09/03/15 0745   09/02/15 1400  vancomycin (VANCOCIN) IVPB 1000 mg/200 mL premix     1,000 mg 200 mL/hr over 60 Minutes Intravenous  Once 09/02/15 1234 09/02/15 1430   09/02/15 0900  imipenem-cilastatin (PRIMAXIN) 500 mg in sodium chloride 0.9 % 100 mL IVPB  Status:  Discontinued     500 mg 200 mL/hr over 30 Minutes Intravenous Every 8 hours 09/02/15 0814 09/03/15 0749   08/30/15 0400  vancomycin (VANCOCIN) 1,250 mg in sodium chloride 0.9 % 250 mL IVPB  Status:  Discontinued     1,250 mg 166.7 mL/hr over 90 Minutes Intravenous To Surgery 08/29/15 0611 08/29/15 1012   08/30/15 0400  cefUROXime (ZINACEF) 1.5 g in dextrose 5 % 50 mL IVPB  Status:  Discontinued     1.5 g 100 mL/hr over 30 Minutes  Intravenous To Surgery 08/29/15 0611 08/29/15 1012   08/30/15 0400  fluconazole (DIFLUCAN) IVPB 400 mg  Status:  Discontinued     400 mg 100 mL/hr over 120 Minutes Intravenous To Surgery 08/29/15 0611 08/29/15 1012   08/30/15 0400  rifampin (RIFADIN) 600 mg in sodium chloride 0.9 % 100 mL IVPB  Status:  Discontinued     600 mg 200 mL/hr over 30 Minutes Intravenous To Surgery 08/29/15 0611 08/29/15 1012   08/29/15 0845  vancomycin (VANCOCIN) 1,000 mg in sodium chloride 0.9 % 1,000 mL irrigation  Status:  Discontinued      Irrigation To Surgery  08/29/15 0834 08/29/15 1012   08/29/15 0615  cefUROXime (ZINACEF) 750 mg in dextrose 5 % 50 mL IVPB  Status:  Discontinued     750 mg 100 mL/hr over 30 Minutes Intravenous To Surgery 08/29/15 0609 08/29/15 1012   08/29/15 0615  vancomycin (VANCOCIN) powder 1,000 mg     1,000 mg Other To Surgery 08/29/15 0609 08/29/15 0900   08/27/15 2300  vancomycin (VANCOCIN) 50 mg/mL oral solution 125 mg  Status:  Discontinued     125 mg Oral Every 6 hours 08/26/15 2200 08/27/15 0807   08/27/15 1601  vancomycin (VANCOCIN) 1,000 mg in sodium chloride 0.9 % 1,000 mL irrigation  Status:  Discontinued       As needed 08/27/15 1601 08/27/15 1740   08/27/15 1500  vancomycin (VANCOCIN) 1,000 mg in sodium chloride 0.9 % 1,000 mL irrigation  Status:  Discontinued      Irrigation To Surgery 08/27/15 1449 08/27/15 1748   08/27/15 1400  rifampin (RIFADIN) capsule 300 mg     300 mg Oral Every 8 hours 08/27/15 1141     08/27/15 1100  vancomycin (VANCOCIN) 50 mg/mL oral solution 125 mg     125 mg Oral 4 times daily 08/27/15 1052 09/10/15 0959   08/26/15 2145  nafcillin 2 g in dextrose 5 % 100 mL IVPB  Status:  Discontinued     2 g 200 mL/hr over 30 Minutes Intravenous Every 4 hours 08/26/15 2104 09/02/15 0850      Assessment/Plan: s/p Procedure(s): DEBRIDEMENT AND CLOSURE WOUND WITH PLACEMENT OF ABRA CLOSURE DEVICE (N/A) Will plan to follow up tomorrow for dressing change  only.   LOS: 20 days    Melyssa Signor,PA-C Plastic Surgery [phone]

## 2015-09-16 ENCOUNTER — Inpatient Hospital Stay (HOSPITAL_COMMUNITY): Payer: 59

## 2015-09-16 LAB — RENAL FUNCTION PANEL
Albumin: 1.6 g/dL — ABNORMAL LOW (ref 3.5–5.0)
Anion gap: 10 (ref 5–15)
BUN: 51 mg/dL — ABNORMAL HIGH (ref 6–20)
CO2: 22 mmol/L (ref 22–32)
Calcium: 8.6 mg/dL — ABNORMAL LOW (ref 8.9–10.3)
Chloride: 106 mmol/L (ref 101–111)
Creatinine, Ser: 6.23 mg/dL — ABNORMAL HIGH (ref 0.61–1.24)
GFR calc Af Amer: 11 mL/min — ABNORMAL LOW (ref >60)
GFR calc non Af Amer: 10 mL/min — ABNORMAL LOW (ref >60)
Glucose, Bld: 105 mg/dL — ABNORMAL HIGH (ref 65–99)
Phosphorus: 3.9 mg/dL (ref 2.5–4.6)
Potassium: 3.7 mmol/L (ref 3.5–5.1)
Sodium: 138 mmol/L (ref 135–145)

## 2015-09-16 LAB — CBC
HCT: 26.1 % — ABNORMAL LOW (ref 39.0–52.0)
HEMATOCRIT: 24.5 % — AB (ref 39.0–52.0)
HEMOGLOBIN: 8.2 g/dL — AB (ref 13.0–17.0)
Hemoglobin: 8.7 g/dL — ABNORMAL LOW (ref 13.0–17.0)
MCH: 27 pg (ref 26.0–34.0)
MCH: 27.9 pg (ref 26.0–34.0)
MCHC: 33.3 g/dL (ref 30.0–36.0)
MCHC: 33.5 g/dL (ref 30.0–36.0)
MCV: 81.1 fL (ref 78.0–100.0)
MCV: 83.3 fL (ref 78.0–100.0)
PLATELETS: 201 10*3/uL (ref 150–400)
Platelets: 201 10*3/uL (ref 150–400)
RBC: 3.22 MIL/uL — ABNORMAL LOW (ref 4.22–5.81)
RBC: 2.94 MIL/uL — AB (ref 4.22–5.81)
RDW: 20.8 % — ABNORMAL HIGH (ref 11.5–15.5)
RDW: 20.8 % — ABNORMAL HIGH (ref 11.5–15.5)
WBC: 18 10*3/uL — ABNORMAL HIGH (ref 4.0–10.5)
WBC: 19.1 10*3/uL — ABNORMAL HIGH (ref 4.0–10.5)

## 2015-09-16 LAB — GLUCOSE, CAPILLARY
GLUCOSE-CAPILLARY: 99 mg/dL (ref 65–99)
Glucose-Capillary: 102 mg/dL — ABNORMAL HIGH (ref 65–99)
Glucose-Capillary: 102 mg/dL — ABNORMAL HIGH (ref 65–99)
Glucose-Capillary: 104 mg/dL — ABNORMAL HIGH (ref 65–99)
Glucose-Capillary: 114 mg/dL — ABNORMAL HIGH (ref 65–99)
Glucose-Capillary: 91 mg/dL (ref 65–99)

## 2015-09-16 LAB — POCT I-STAT, CHEM 8
BUN: 19 mg/dL (ref 6–20)
CALCIUM ION: 1.05 mmol/L — AB (ref 1.12–1.23)
CHLORIDE: 99 mmol/L — AB (ref 101–111)
CREATININE: 2.8 mg/dL — AB (ref 0.61–1.24)
GLUCOSE: 97 mg/dL (ref 65–99)
HCT: 28 % — ABNORMAL LOW (ref 39.0–52.0)
Hemoglobin: 9.5 g/dL — ABNORMAL LOW (ref 13.0–17.0)
Potassium: 3.6 mmol/L (ref 3.5–5.1)
Sodium: 141 mmol/L (ref 135–145)
TCO2: 26 mmol/L (ref 0–100)

## 2015-09-16 LAB — CARBOXYHEMOGLOBIN
Carboxyhemoglobin: 2.7 % — ABNORMAL HIGH (ref 0.5–1.5)
Methemoglobin: 1.2 % (ref 0.0–1.5)
O2 SAT: 78.6 %
Total hemoglobin: 12.1 g/dL — ABNORMAL LOW (ref 13.5–18.0)

## 2015-09-16 LAB — HEPARIN LEVEL (UNFRACTIONATED): Heparin Unfractionated: <0.1 IU/mL — ABNORMAL LOW (ref 0.30–0.70)

## 2015-09-16 LAB — LACTATE DEHYDROGENASE: LDH: 295 U/L — AB (ref 98–192)

## 2015-09-16 MED ORDER — SODIUM CHLORIDE 0.9 % IV BOLUS (SEPSIS)
250.0000 mL | Freq: Once | INTRAVENOUS | Status: AC
  Administered 2015-09-16: 250 mL via INTRAVENOUS

## 2015-09-16 MED ORDER — HYDRALAZINE HCL 20 MG/ML IJ SOLN
10.0000 mg | INTRAMUSCULAR | Status: DC
  Administered 2015-09-16 (×3): 10 mg via INTRAVENOUS
  Filled 2015-09-16 (×3): qty 1

## 2015-09-16 NOTE — Progress Notes (Signed)
Patient started vomiting this morning and [employer] tube came partially out. Tube is secured and [employer] Representative notified. Will advance tube back to post-pyloric position. Will continue to monitor closely.  Maia Petties, RN

## 2015-09-16 NOTE — Progress Notes (Signed)
Occupational Therapy Treatment Patient Details Name: [name] MRN: [id number] DOB: [birth year] Today's Date: 09/16/2015    History of present illness [name] is a 45 y.o. 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 TEE on 5/11 negative 5/22 DEBRIDEMENT AND CLOSURE WOUND WITH PLACEMENT OF ABRA CLOSURE DEVICE    OT comments  Pt moving slowly today.  Very flat affect with minimal interactions.  He initially required encouragement to participate - states he "doesn't feel good", but once up, was motivated and determined to fully participate.  He required multiple seated rest breaks today.   Follow Up Recommendations  CIR;Supervision/Assistance - 24 hour    Equipment Recommendations  None recommended by OT    Recommendations for Other Services      Precautions / Restrictions Precautions Precautions: Fall       Mobility Bed Mobility Overal bed mobility: Needs Assistance Bed Mobility: Rolling;Sidelying to Sit     Supine to sit: Min assist;+2 for physical assistance     General bed mobility comments: assist to move LEs off bed and to lift trunk.  Pt moves slowly.   Transfers Overall transfer level: Needs assistance Equipment used: Rolling walker (2 wheeled) Transfers: Sit to/from [facility] Sit to Stand: Min assist;+2 physical assistance;+2 safety/equipment Stand pivot transfers: Min assist;+2 physical assistance;+2 safety/equipment       General transfer comment: Pt initially required +2 physical assist to move sit to stand, and to steady during transfer, but progressed to min A +1 and +1 for safety and lines     Balance Overall balance assessment: Needs assistance Sitting-balance support: Feet supported Sitting balance-Leahy Scale: Fair     Standing balance support:  Bilateral upper extremity supported Standing balance-Leahy Scale: Poor                     ADL Overall ADL's : Needs assistance/impaired Eating/Feeding: Set up;Sitting                       Toilet Transfer: Minimal assistance;+2 for physical assistance;+2 for safety/equipment;BSC;RW Toilet Transfer Details (indicate cue type and reason): Pt mildly unsteady.  Pt requires +2 for lines and safety  Toileting- Clothing Manipulation and Hygiene: Moderate assistance;Sit to/from stand       Functional mobility during ADLs: Minimal assistance;+2 for physical assistance;+2 for safety/equipment;Rolling walker General ADL Comments: Pt minimally interactive today.  States he does not feel good.  Initially required encouragement to participate, but once he was up, was very motivated, but fatigued       Vision                     Perception     Praxis      Cognition   Behavior During Therapy: Flat affect (poor eye contact ) Overall Cognitive Status: Within Functional Limits for tasks assessed                       Extremity/Trunk Assessment               Exercises     Shoulder Instructions       General Comments      Pertinent Vitals/ Pain       Pain Assessment: Faces Faces Pain Scale: Hurts little more Pain Location: abdomen  Pain Descriptors /  Indicators: Aching;Sore Pain Intervention(s): Monitored during session;Repositioned  Home Living                                          Prior Functioning/Environment              Frequency Min 2X/week     Progress Toward Goals  OT Goals(current goals can now be found in the care plan section)  Progress towards OT goals: Progressing toward goals  ADL Goals Pt Will Perform Grooming: with supervision;standing Pt Will Perform Upper Body Bathing: with set-up;sitting;with supervision Pt Will Perform Lower Body Bathing: with set-up;with supervision;sit to/from stand Pt  Will Perform Upper Body Dressing: with set-up;with supervision;sitting Pt Will Perform Lower Body Dressing: with set-up;with supervision;sit to/from stand Pt Will Transfer to Toilet: with set-up;with supervision;ambulating;regular height toilet Pt Will Perform Toileting - Clothing Manipulation and hygiene: with set-up;with supervision;sit to/from stand Pt/caregiver will Perform Home Exercise Program: Increased strength;Both right and left upper extremity;With theraband Additional ADL Goal #1: Pt will perform management of LVAD equipment and vest independently.  Plan Discharge plan remains appropriate    Co-evaluation    PT/OT/SLP Co-Evaluation/Treatment: Yes Reason for Co-Treatment: For patient/therapist safety;Complexity of the patient's impairments (multi-system involvement)   OT goals addressed during session: ADL's and self-care      End of Session Equipment Utilized During Treatment: Rolling walker   Activity Tolerance Patient limited by lethargy   Patient Left in bed;with call bell/phone within reach;with nursing/sitter in room   Nurse Communication Mobility status        Time: [id number] OT Time Calculation (min): 43 min  Charges: OT General Charges $OT Visit: 1 Procedure OT Treatments $Therapeutic Activity: 8-22 mins  Eric Nees M 09/16/2015, 2:20 PM

## 2015-09-16 NOTE — Progress Notes (Signed)
7 Days Post-Op  Subjective: Patient still very nauseated. Hematemesis and diarrhea. Not passing flatus this am per patient.  Planning for EGD as well as closure of ABD wound at this point.   ABD wound dressing change done under sterile techniques. The [place] Handing was loosened and dressing removed. Scant tan drainage on dressing, no odor. Sorbact removed. There appears to be granulation tissue over the entire area now. Can no longer see the LVAD device in wound bed. Did not probe area.  Left side wound edges have significant epibole > right side.  New Sorbact and 2x2 gauze with surgical lubricant applied over the Sorbact and the [place] device was tightened again. Patient somnolent through out procedure.   Objective: Vital signs in last 24 hours: Temp:  [97.2 F (36.2 C)-98.7 F (37.1 C)] 98.2 F (36.8 C) (05/29 0800) Pulse Rate:  [74-94] 84 (05/29 1000) Resp:  [11-24] 14 (05/29 1000) BP: (103-154)/(73-95) 117/95 mmHg (05/29 1000) SpO2:  [98 %-100 %] 100 % (05/29 1000) Weight:  [82.6 kg (182 lb 1.6 oz)] 82.6 kg (182 lb 1.6 oz) (05/29 0500) Last BM Date: 09/16/15  Intake/Output from previous day: 05/28 0701 - 05/29 0700 In: 2565 [I.V.:1210; NG/GT:1305; IV Piggyback:50] Out: 970 [Urine:970] Intake/Output this shift: Total I/O In: 180 [I.V.:90; NG/GT:90] Out: 260 [Urine:160; Emesis/NG output:100]  General appearance: mild distress and very somnolent ABD wound dressing change as above. the wound edges with significant epibole now and will need to be lifted for closure.   Lab Results:   Recent Labs  09/15/15 0414 09/16/15 0409  WBC 17.0* 19.1*  HGB 8.5* 8.7*  HCT 26.1* 26.1*  PLT 201 201   BMET  Recent Labs  09/15/15 0414 09/16/15 0409  NA 139 138  K 3.6 3.7  CL 104 106  CO2 25 22  GLUCOSE 110* 105*  BUN 40* 51*  CREATININE 5.11* 6.23*  CALCIUM 8.4* 8.6*   PT/INR No results for input(s): LABPROT, INR in the last 72 hours. ABG No results for input(s): PHART, HCO3 in the  last 72 hours.  Invalid input(s): PCO2, PO2  Studies/Results: Dg Abd Portable 1v  09/14/2015  CLINICAL DATA:  Patient status post feeding tube placement. EXAM: PORTABLE ABDOMEN - 1 VIEW COMPARISON:  Radiograph 09/13/2015. FINDINGS: Weighted feeding tube is present with tip projecting in the expected location of the fourth portion of the duodenum. LVAD device is present. Nonspecific bowel gas pattern with mildly prominent gaseous distended loops of bowel within the right lower quadrant. Unremarkable osseous skeleton. IMPRESSION: Feeding tube tip appears to project over the fourth portion of the duodenum. Nonspecific bowel-gas pattern with suggestion of gaseous distention of loops of bowel in the right lower quadrant. Electronically Signed   By: Lovey Newcomer M.D.   On: 09/14/2015 18:05    Anti-infectives: Anti-infectives    Start     Dose/Rate Route Frequency Ordered Stop   09/11/15 0600  vancomycin (VANCOCIN) 50 mg/mL oral solution 125 mg     125 mg Oral Every 8 hours 09/10/15 2232     09/09/15 2000  ceFAZolin (ANCEF) IVPB 1 g/50 mL premix     1 g 100 mL/hr over 30 Minutes Intravenous Every 24 hours 09/09/15 1239     09/09/15 1011  polymyxin B 500,000 Units, bacitracin 50,000 Units in sodium chloride irrigation 0.9 % 500 mL irrigation  Status:  Discontinued       As needed 09/09/15 1012 09/09/15 1047   09/09/15 0800  ceFAZolin (ANCEF) IVPB 1 g/50 mL premix  Status:  Discontinued     1 g 100 mL/hr over 30 Minutes Intravenous Every 8 hours 09/09/15 0759 09/09/15 1239   09/09/15 0600  ceFAZolin (ANCEF) IVPB 2g/100 mL premix  Status:  Discontinued     2 g 200 mL/hr over 30 Minutes Intravenous To ShortStay Surgical 09/08/15 1415 09/09/15 1049   09/05/15 1426  polymyxin B 500,000 Units, bacitracin 50,000 Units in sodium chloride irrigation 0.9 % 500 mL irrigation  Status:  Discontinued       As needed 09/05/15 1434 09/05/15 1513   09/05/15 1100  cefTRIAXone (ROCEPHIN) 2 g in dextrose 5 % 50 mL  IVPB  Status:  Discontinued     2 g 100 mL/hr over 30 Minutes Intravenous Every 24 hours 09/05/15 0948 09/09/15 1238   09/05/15 1045  ceFAZolin (ANCEF) IVPB 2g/100 mL premix     2 g 200 mL/hr over 30 Minutes Intravenous To ShortStay Surgical 09/04/15 2206 09/05/15 1345   09/04/15 1000  imipenem-cilastatin (PRIMAXIN) 500 mg in sodium chloride 0.9 % 100 mL IVPB  Status:  Discontinued     500 mg 200 mL/hr over 30 Minutes Intravenous Every 12 hours 09/03/15 1817 09/05/15 0948   09/04/15 0600  vancomycin (VANCOCIN) IVPB 1000 mg/200 mL premix  Status:  Discontinued     1,000 mg 200 mL/hr over 60 Minutes Intravenous Every 24 hours 09/03/15 0757 09/03/15 0953   09/03/15 1518  vancomycin (VANCOCIN) 1,000 mg in sodium chloride 0.9 % 1,000 mL irrigation  Status:  Discontinued       As needed 09/03/15 1518 09/03/15 1525   09/03/15 1330  vancomycin (VANCOCIN) 1,000 mg in sodium chloride 0.9 % 1,000 mL irrigation  Status:  Discontinued      Irrigation To Surgery 09/03/15 1325 09/03/15 1628   09/03/15 1200  imipenem-cilastatin (PRIMAXIN) 250 mg in sodium chloride 0.9 % 100 mL IVPB  Status:  Discontinued     250 mg 200 mL/hr over 30 Minutes Intravenous Every 6 hours 09/03/15 0749 09/03/15 1817   09/03/15 0200  vancomycin (VANCOCIN) IVPB 750 mg/150 ml premix  Status:  Discontinued     750 mg 150 mL/hr over 60 Minutes Intravenous Every 12 hours 09/02/15 1234 09/03/15 0745   09/02/15 1400  vancomycin (VANCOCIN) IVPB 1000 mg/200 mL premix     1,000 mg 200 mL/hr over 60 Minutes Intravenous  Once 09/02/15 1234 09/02/15 1430   09/02/15 0900  imipenem-cilastatin (PRIMAXIN) 500 mg in sodium chloride 0.9 % 100 mL IVPB  Status:  Discontinued     500 mg 200 mL/hr over 30 Minutes Intravenous Every 8 hours 09/02/15 0814 09/03/15 0749   08/30/15 0400  vancomycin (VANCOCIN) 1,250 mg in sodium chloride 0.9 % 250 mL IVPB  Status:  Discontinued     1,250 mg 166.7 mL/hr over 90 Minutes Intravenous To Surgery 08/29/15 0611  08/29/15 1012   08/30/15 0400  cefUROXime (ZINACEF) 1.5 g in dextrose 5 % 50 mL IVPB  Status:  Discontinued     1.5 g 100 mL/hr over 30 Minutes Intravenous To Surgery 08/29/15 0611 08/29/15 1012   08/30/15 0400  fluconazole (DIFLUCAN) IVPB 400 mg  Status:  Discontinued     400 mg 100 mL/hr over 120 Minutes Intravenous To Surgery 08/29/15 0611 08/29/15 1012   08/30/15 0400  rifampin (RIFADIN) 600 mg in sodium chloride 0.9 % 100 mL IVPB  Status:  Discontinued     600 mg 200 mL/hr over 30 Minutes Intravenous To Surgery 08/29/15 0611 08/29/15 1012  08/29/15 0845  vancomycin (VANCOCIN) 1,000 mg in sodium chloride 0.9 % 1,000 mL irrigation  Status:  Discontinued      Irrigation To Surgery 08/29/15 0834 08/29/15 1012   08/29/15 0615  cefUROXime (ZINACEF) 750 mg in dextrose 5 % 50 mL IVPB  Status:  Discontinued     750 mg 100 mL/hr over 30 Minutes Intravenous To Surgery 08/29/15 0609 08/29/15 1012   08/29/15 0615  vancomycin (VANCOCIN) powder 1,000 mg     1,000 mg Other To Surgery 08/29/15 0609 08/29/15 0900   08/27/15 2300  vancomycin (VANCOCIN) 50 mg/mL oral solution 125 mg  Status:  Discontinued     125 mg Oral Every 6 hours 08/26/15 2200 08/27/15 0807   08/27/15 1601  vancomycin (VANCOCIN) 1,000 mg in sodium chloride 0.9 % 1,000 mL irrigation  Status:  Discontinued       As needed 08/27/15 1601 08/27/15 1740   08/27/15 1500  vancomycin (VANCOCIN) 1,000 mg in sodium chloride 0.9 % 1,000 mL irrigation  Status:  Discontinued      Irrigation To Surgery 08/27/15 1449 08/27/15 1748   08/27/15 1400  rifampin (RIFADIN) capsule 300 mg     300 mg Oral Every 8 hours 08/27/15 1141     08/27/15 1100  vancomycin (VANCOCIN) 50 mg/mL oral solution 125 mg     125 mg Oral 4 times daily 08/27/15 1052 09/10/15 0959   08/26/15 2145  nafcillin 2 g in dextrose 5 % 100 mL IVPB  Status:  Discontinued     2 g 200 mL/hr over 30 Minutes Intravenous Every 4 hours 08/26/15 2104 09/02/15 0850       Assessment/Plan: s/p Procedure(s): DEBRIDEMENT AND CLOSURE WOUND WITH PLACEMENT OF ABRA CLOSURE DEVICE (N/A) Plan is still for OR tomorrow for wound closure.   Could try incisional VAC if feel would be tolerated.   LOS: 21 days    Damek Ende,PA-C Plastic Surgery [phone]

## 2015-09-16 NOTE — Progress Notes (Signed)
[facility] Progress Note  Subjective:  Vomited this AM even though he hadn't taken anything PO; nurse said maybe some old blood/coffee ground material but no clots.  Had a BM yesterday.  Says that his abdomen feels distended.  Objective:  Vital signs in last 24 hours: Temp:  [97.2 F (36.2 C)-98.7 F (37.1 C)] 98.2 F (36.8 C) (05/29 0800) Pulse Rate:  [74-94] 87 (05/29 0800) Resp:  [11-24] 16 (05/29 0800) BP: (103-164)/(73-99) 120/83 mmHg (05/29 0800) SpO2:  [98 %-100 %] 100 % (05/29 0800) Weight:  [182 lb 1.6 oz (82.6 kg)] 182 lb 1.6 oz (82.6 kg) (05/29 0500) Last BM Date: 09/16/15 General:  Alert, Well-developed, in NAD Heart:  LVAD hum noted. Pulm:  CTAB.   Abdomen:  Soft, distended.  BS present.  Mild diffuse TTP. Extremities:  Without edema. Neurologic:  Alert and oriented x 4;  grossly normal neurologically. Psych:  Alert and cooperative. Normal mood and affect.  Intake/Output from previous day: 05/28 0701 - 05/29 0700 In: 2565 [I.V.:1210; NG/GT:1305; IV Piggyback:50] Out: 48 [Urine:970] Intake/Output this shift: Total I/O In: 95 [I.V.:50; NG/GT:45] Out: -   Lab Results:  Recent Labs  09/14/15 0515 09/15/15 0414 09/16/15 0409  WBC 16.2* 17.0* 19.1*  HGB 7.8* 8.5* 8.7*  HCT 23.4* 26.1* 26.1*  PLT 164 201 201   BMET  Recent Labs  09/14/15 0515 09/15/15 0414 09/16/15 0409  NA 138 139 138  K 3.4* 3.6 3.7  CL 102 104 106  CO2 27 25 22   GLUCOSE 92 110* 105*  BUN 25* 40* 51*  CREATININE 4.02* 5.11* 6.23*  CALCIUM 8.4* 8.4* 8.6*   LFT  Recent Labs  09/15/15 1347 09/16/15 0409  PROT 6.0*  --   ALBUMIN 1.6* 1.6*  AST 71*  --   ALT 22  --   ALKPHOS 178*  --   BILITOT 7.1*  --   BILIDIR 4.2*  --   IBILI 2.9*  --    Dg Abd Portable 1v  09/14/2015  CLINICAL DATA:  Patient status post feeding tube placement. EXAM: PORTABLE ABDOMEN - 1 VIEW COMPARISON:  Radiograph 09/13/2015. FINDINGS: Weighted feeding tube is present with tip  projecting in the expected location of the fourth portion of the duodenum. LVAD device is present. Nonspecific bowel gas pattern with mildly prominent gaseous distended loops of bowel within the right lower quadrant. Unremarkable osseous skeleton. IMPRESSION: Feeding tube tip appears to project over the fourth portion of the duodenum. Nonspecific bowel-gas pattern with suggestion of gaseous distention of loops of bowel in the right lower quadrant. Electronically Signed   By: Lovey Newcomer M.D.   On: 09/14/2015 18:05   Assessment / Plan: -Nausea and vomiting with hematemesis: Symptoms mostly brought on my taking anything PO. Tolerating post-pyloric tube feeds at [facility]. ? Ulcer vs esophagitis, etc. MWT could be cause of hematemesis. ? If nausea and vomiting due to meds. -LVAD MSSA driveline infection: On IV abx and s/p debridement. On heparin gtt currently due to LVAD; will need coumadin eventually. -Anemia: Has received several units of PRBC's this admission.  Hgb stable this AM. -AKI: Renal following ans suspects ATN. For dialysis today. -Non-ischemic cardiomyopathy supported by LVAD -Elevated LFT's/hyperbilirubinema: Being followed by primary service for now. They are considering ultrasound.   *Continue empiric PPI IV BID for now and scheduled anti-emetics. *Planning for EGD around 1:30 pm tomorrow, 5/30, so orders put to hold tube feeds after midnight and hold heparin gtt at 6:30 am.  LOS: 21 days   ZEHR, JESSICA D.  09/16/2015, 8:44 AM  Pager number [phone]   ________________________________________________________________________  Velora Heckler GI MD note:  I personally examined the patient, reviewed the data and agree with the assessment and plan described above.  Abd films today, perhaps the ileus has returned despite imaging two days ago suggesting otherwise.  We are putting him on schedule for EGD with Drs. Hung/Mann with MAC sedation.  Currently it is on for 1:30pm.  I  understand that he may be having repeat debridement tomorrow as well.  I will leave it to the AM teams to coordinate final schedule but we'll leave him on for the 1:30 EGD in endoscopy and will plan to hold heparin 6 hours prior.     Owens Loffler, MD [facility] Pager [phone]

## 2015-09-16 NOTE — Progress Notes (Signed)
Inpatient Rehabilitation  Pt. Scheduled for OR tomorrow for wound closure.  Not ready for IP Rehab.    [facility] Admissions Coordinator Cell [phone] Office [phone]

## 2015-09-16 NOTE — Progress Notes (Signed)
Subjective: Interval History: has no complaint .  Objective: Vital signs in last 24 hours: Temp:  [97.2 F (36.2 C)-98.7 F (37.1 C)] 97.2 F (36.2 C) (05/29 0400) Pulse Rate:  [74-94] 89 (05/29 0700) Resp:  [11-24] 15 (05/29 0700) BP: (103-164)/(73-99) 115/76 mmHg (05/29 0700) SpO2:  [98 %-100 %] 100 % (05/29 0700) Weight:  [82.6 kg (182 lb 1.6 oz)] 82.6 kg (182 lb 1.6 oz) (05/29 0500) Weight change: 1.3 kg (2 lb 13.9 oz)  Intake/Output from previous day: 05/28 0701 - 05/29 0700 In: 2565 [I.V.:1210; NG/GT:1305; IV Piggyback:50] Out: 970 [Urine:970] Intake/Output this shift:    General appearance: alert, cooperative and no distress Resp: diminished breath sounds bibasilar and rales bibasilar Chest wall: R  cath Cardio: S1, S2 normal and cont hum of LVAD GI: distended, pos bs Extremities: edema 2+  Lab Results:  Recent Labs  09/15/15 0414 09/16/15 0409  WBC 17.0* 19.1*  HGB 8.5* 8.7*  HCT 26.1* 26.1*  PLT 201 201   BMET:  Recent Labs  09/15/15 0414 09/16/15 0409  NA 139 138  K 3.6 3.7  CL 104 106  CO2 25 22  GLUCOSE 110* 105*  BUN 40* 51*  CREATININE 5.11* 6.23*  CALCIUM 8.4* 8.6*   No results for input(s): PTH in the last 72 hours. Iron Studies: No results for input(s): IRON, TIBC, TRANSFERRIN, FERRITIN in the last 72 hours.  Studies/Results: Dg Abd Portable 1v  09/14/2015  CLINICAL DATA:  Patient status post feeding tube placement. EXAM: PORTABLE ABDOMEN - 1 VIEW COMPARISON:  Radiograph 09/13/2015. FINDINGS: Weighted feeding tube is present with tip projecting in the expected location of the fourth portion of the duodenum. LVAD device is present. Nonspecific bowel gas pattern with mildly prominent gaseous distended loops of bowel within the right lower quadrant. Unremarkable osseous skeleton. IMPRESSION: Feeding tube tip appears to project over the fourth portion of the duodenum. Nonspecific bowel-gas pattern with suggestion of gaseous distention of  loops of bowel in the right lower quadrant. Electronically Signed   By: Lovey Newcomer M.D.   On: 09/14/2015 18:05    I have reviewed the patient's current medications.  Assessment/Plan: 1 AKI  ?? What baseline is.  Nonoliguric.  But no clearance, for HD. 2 LVAD infx on AB 3  CM 4 Shock resolved 5 Nutrition on TF P HD, follow urine, clearance, cont  AB    LOS: 21 days   Tytus Strahle L 09/16/2015,7:46 AM

## 2015-09-16 NOTE — Progress Notes (Addendum)
[facility] VAD team made aware of sustained PIs 5.5-6.0, CVP 6, increase in frequency of 3-4 beat runs of VT, doppler MAP 58 (automatic cuff MAP 71.) Made aware that I sent STAT labs. Istat results given. [name] states that he feels ok. Will give NS 279ml bolus, d/c scheduled Hydralizine, and see what the lab work shows. Will continue to closely monitor. ADarcel Smalling RN   Events and orders above discussed the above with Dr. Aundra Dubin.  [name], RN VAD Coordinator   Office: [phone] 24/7 VAD Pager: [phone]

## 2015-09-16 NOTE — Progress Notes (Signed)
7 Days Post-Op Procedure(s) (LRB): DEBRIDEMENT AND CLOSURE WOUND WITH PLACEMENT OF ABRA CLOSURE DEVICE (N/A) Subjective: Hb stable after transfusion- seen by GI Not nauseated on TF Will stop iv heparin early am 5-30 and close abdominal wound in OR mid day  Objective: Vital signs in last 24 hours: Temp:  [97.2 F (36.2 C)-98.7 F (37.1 C)] 98.2 F (36.8 C) (05/29 0800) Pulse Rate:  [74-94] 87 (05/29 0800) Cardiac Rhythm:  [-] Normal sinus rhythm (05/29 0800) Resp:  [11-24] 16 (05/29 0800) BP: (103-164)/(73-99) 120/83 mmHg (05/29 0800) SpO2:  [98 %-100 %] 100 % (05/29 0800) Weight:  [182 lb 1.6 oz (82.6 kg)] 182 lb 1.6 oz (82.6 kg) (05/29 0500)  Hemodynamic parameters for last 24 hours: CVP:  [9 mmHg-11 mmHg] 11 mmHg  Intake/Output from previous day: 05/28 0701 - 05/29 0700 In: 2565 [I.V.:1210; NG/GT:1305; IV Piggyback:50] Out: 970 [Urine:970] Intake/Output this shift: Total I/O In: 95 [I.V.:50; NG/GT:45] Out: -   abd non tender  Lab Results:  Recent Labs  09/15/15 0414 09/16/15 0409  WBC 17.0* 19.1*  HGB 8.5* 8.7*  HCT 26.1* 26.1*  PLT 201 201   BMET:  Recent Labs  09/15/15 0414 09/16/15 0409  NA 139 138  K 3.6 3.7  CL 104 106  CO2 25 22  GLUCOSE 110* 105*  BUN 40* 51*  CREATININE 5.11* 6.23*  CALCIUM 8.4* 8.6*    PT/INR: No results for input(s): LABPROT, INR in the last 72 hours. ABG    Component Value Date/Time   PHART 7.332* 09/09/2015 1145   HCO3 20.2 09/09/2015 1145   TCO2 26 09/13/2015 1608   ACIDBASEDEF 5.0* 09/09/2015 1145   O2SAT 78.6 09/16/2015 0400   CBG (last 3)   Recent Labs  09/15/15 2316 09/15/15 2346 09/16/15 0357  GLUCAP 240* 101* 102*    Assessment/Plan: S/P Procedure(s) (LRB): DEBRIDEMENT AND CLOSURE WOUND WITH PLACEMENT OF ABRA CLOSURE DEVICE (N/A) Nausea prob from meds,, C diff Hold coumadin   LOS: 21 days    Tharon Aquas Trigt III 09/16/2015

## 2015-09-16 NOTE — Progress Notes (Signed)
Physical Therapy Treatment Patient Details Name: [name] MRN: [id number] DOB: 1970/12/18 Today's Date: 09/16/2015    History of Present Illness [name] is a 45 y.o. 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 TEE on 5/11 negative 5/22 DEBRIDEMENT AND CLOSURE WOUND WITH PLACEMENT OF ABRA CLOSURE DEVICE     PT Comments    Pt admitted with above diagnosis. Pt currently with functional limitations due to balance and endurance deficits. Pt was able to ambulate after max encouragement.  Needed several sitting rest breaks to complete entire circle on unit.  Will continue as pt allows.  Pt will benefit from skilled PT to increase their independence and safety with mobility to allow discharge to the venue listed below.    Follow Up Recommendations  Supervision/Assistance - 24 hour;CIR     Equipment Recommendations  Other (comment) (TBA)    Recommendations for Other Services Rehab consult     Precautions / Restrictions Precautions Precautions: Fall Restrictions Weight Bearing Restrictions: No    Mobility  Bed Mobility Overal bed mobility: Needs Assistance Bed Mobility: Rolling;Sidelying to Sit Rolling: Min assist Sidelying to sit: Min assist;HOB elevated Supine to sit: Min assist;+2 for physical assistance     General bed mobility comments: assist to move LEs off bed and to lift trunk.  Pt moves slowly.   Transfers Overall transfer level: Needs assistance Equipment used: Rolling walker (2 wheeled) Transfers: Sit to/from Stand Sit to Stand: Min guard;+2 safety/equipment Stand pivot transfers: Min assist;+2 physical assistance;+2 safety/equipment       General transfer comment: Pt initially required +2 physical assist to move sit to stand, and to steady during transfer, but progressed to min A  +1 and +1 for safety and lines   Ambulation/Gait Ambulation/Gait assistance: Min guard;Min assist;+2 safety/equipment Ambulation Distance (Feet): 360 Feet (5 sitting rest breaks ) Assistive device: Rolling walker (2 wheeled) Gait Pattern/deviations: Step-through pattern;Decreased stride length;Trunk flexed;Wide base of support   Gait velocity interpretation: Below normal speed for age/gender General Gait Details: Needed several sitting rest breaks.  Followed pt with chair.  Pt needing mostly cues only and steadying assist.    Stairs            Wheelchair Mobility    Modified Rankin (Stroke Patients Only)       Balance Overall balance assessment: Needs assistance Sitting-balance support: Feet supported;Bilateral upper extremity supported Sitting balance-Leahy Scale: Fair Sitting balance - Comments: Pt sat 10 minutes EOB with supervision while nsg and therapy cleaned pt and changed gown.    Standing balance support: Bilateral upper extremity supported;During functional activity Standing balance-Leahy Scale: Poor Standing balance comment: Relies on RW for balance with ambulation.                    Cognition Arousal/Alertness: Awake/alert Behavior During Therapy: Flat affect (poor eye contact ) Overall Cognitive Status: Within Functional Limits for tasks assessed                      Exercises      General Comments General comments (skin integrity, edema, etc.): Pt very tearful and not conversant.        Pertinent Vitals/Pain Pain Assessment: No/denies pain Faces Pain Scale: Hurts little more Pain Location: abdomen  Pain Descriptors / Indicators: Aching;Sore Pain Intervention(s): Monitored during session;Repositioned  VSS  Home Living                      Prior Function            PT Goals (current goals can now be found in the care plan section) Progress towards PT goals: Progressing toward goals    Frequency  Min  3X/week    PT Plan Current plan remains appropriate    Co-evaluation PT/OT/SLP Co-Evaluation/Treatment: Yes Reason for Co-Treatment: For patient/therapist safety PT goals addressed during session: Mobility/safety with mobility OT goals addressed during session: ADL's and self-care     End of Session Equipment Utilized During Treatment: Gait belt Activity Tolerance: Patient limited by fatigue Patient left: with call bell/phone within reach;with nursing/sitter in room;in chair     Time: 1207-1249 PT Time Calculation (min) (ACUTE ONLY): 42 min  Charges:  $Gait Training: 23-37 mins                    G CodesDenice Paradise 14-Oct-2015, 3:28 PM Allessandra Bernardi,PT Acute Rehabilitation [phone] (pager)

## 2015-09-16 NOTE — Progress Notes (Signed)
Patient ID: [name], male   DOB: [birth year], 45 y.o.   MRN: [id number]    HeartMate 2 Rounding Note  Subjective:    Admitted from [facility] in [place] with driveline abscess. Blood cultures --> MSSA and has C diff. Remains on Nafcillin  and oral vancomycin.   08/27/15 S/P I &D Epigastric with hardware exposed and VAC placement.  5/11 repeat I&D and wound vac replacement.  5/11 TEE no vegetations on valves or ICD wire 5/22 S/P ABRA and Acell   Continues with nausea/vomiting. Unable to take anything po without throwing up. Continues to throw up pebble-sized well formed clots of dark blood. Able to take feeds post-pylorically without problem. On IV protonix and zofran, KUB ok.   No fevers. He is on rifampin, cefazolin, and po vancomycin.   UOP 975 cc yesterday but creatinine climbing. CVP 8 this morning.  Renal plans HD today.   Hydralazine restarted IV but MAPs still high in 90s-100s. hgb up 7.8-> 8.5 -> 8.7.  WBC trending up again.    Plastics has seen and redressed wound today.   VAD INTERROGATION:  HeartMate II LVAD:  Flow 5.0  liters/min, speed 9200, power 5.4  PI 7.2, no PI events.     Objective:    Vital Signs:   Temp:  [97.2 F (36.2 C)-98.7 F (37.1 C)] 98.2 F (36.8 C) (05/29 0800) Pulse Rate:  [74-94] 91 (05/29 0900) Resp:  [11-24] 15 (05/29 0900) BP: (103-154)/(73-91) 126/88 mmHg (05/29 0900) SpO2:  [98 %-100 %] 100 % (05/29 0900) Weight:  [182 lb 1.6 oz (82.6 kg)] 182 lb 1.6 oz (82.6 kg) (05/29 0500) Last BM Date: 09/16/15 Mean arterial Pressure  100-110  Intake/Output:   Intake/Output Summary (Last 24 hours) at 09/16/15 1007 Last data filed at 09/16/15 1000  Gross per 24 hour  Intake   2495 ml  Output   1030 ml  Net   1465 ml     Physical Exam: CVP 8 General: In chair. NAD HEENT: Scleral icterus +panda Neck: supple. Carotids 2+ bilat; no bruits. No lymphadenopathy or thryomegaly appreciated. R subclavian trialysis cath  dressing dry Cor: Mechanical heart sounds with LVAD hum present. Lungs: clear on  Abdomen: soft, tender,  + distended. + bowel sounds. Mid epigastric dressing ok.   Driveline: C/D/I; securement device intact and driveline incorporated Extremities: no cyanosis, clubbing, rash, trace edema  Neuro: A & O x3  Telemetry: NSR 80s   Labs: Basic Metabolic Panel:  Recent Labs Lab 09/10/15 0155  09/11/15 0820 09/12/15 0245 09/13/15 0455 09/13/15 1608 09/14/15 0515 09/14/15 0906 09/15/15 0414 09/16/15 0409  NA 136  < > 136 135 135 140 138  --  139 138  K 3.0*  < > 3.2* 3.2* 3.5 3.6 3.4*  --  3.6 3.7  CL 100*  < > 103 99* 100* 100* 102  --  104 106  CO2 26  < > 22 27 26   --  27  --  25 22  GLUCOSE 101*  < > 88 92 108* 173* 92  --  110* 105*  BUN 32*  < > 50* 18 38* 16 25*  --  40* 51*  CREATININE 5.54*  < > 6.76* 3.50* 5.63* 2.70* 4.02*  --  5.11* 6.23*  CALCIUM 8.1*  < > 8.1* 7.9* 8.2*  --  8.4*  --  8.4* 8.6*  MG 2.3  --  2.7*  --   --   --   --  2.2  --   --   PHOS  --   --   --   --   --   --   --   --  3.4 3.9  < > = values in this interval not displayed.  Liver Function Tests:  Recent Labs Lab 09/11/15 0515 09/12/15 0245 09/13/15 0455 09/14/15 0515 09/15/15 0414 09/15/15 1347 09/16/15 0409  AST 33 78* 95* 74*  --  71*  --   ALT 13* 25 37 30  --  22  --   ALKPHOS 78 168* 136* 161*  --  178*  --   BILITOT 5.3* 8.8* 9.5* 7.5*  --  7.1*  --   PROT 3.2* 6.2* 5.9* 6.0*  --  6.0*  --   ALBUMIN <1.0* 1.6* 1.5* 1.5* 1.5* 1.6* 1.6*   No results for input(s): LIPASE, AMYLASE in the last 168 hours. No results for input(s): AMMONIA in the last 168 hours.  CBC:  Recent Labs Lab 09/12/15 0930 09/13/15 0455 09/13/15 1608 09/14/15 0515 09/15/15 0414 09/16/15 0409  WBC 17.1* 19.2*  --  16.2* 17.0* 19.1*  NEUTROABS  --   --   --  13.7*  --   --   HGB 9.4* 8.9* 9.5* 7.8* 8.5* 8.7*  HCT 28.0* 26.7* 28.0* 23.4* 26.1* 26.1*  MCV 78.9 79.9  --  81.5 81.3 81.1  PLT 178 180  --   164 201 201    INR: No results for input(s): INR in the last 168 hours.  Other results:    Imaging: Dg Abd Portable 1v  09/14/2015  CLINICAL DATA:  Patient status post feeding tube placement. EXAM: PORTABLE ABDOMEN - 1 VIEW COMPARISON:  Radiograph 09/13/2015. FINDINGS: Weighted feeding tube is present with tip projecting in the expected location of the fourth portion of the duodenum. LVAD device is present. Nonspecific bowel gas pattern with mildly prominent gaseous distended loops of bowel within the right lower quadrant. Unremarkable osseous skeleton. IMPRESSION: Feeding tube tip appears to project over the fourth portion of the duodenum. Nonspecific bowel-gas pattern with suggestion of gaseous distention of loops of bowel in the right lower quadrant. Electronically Signed   By: Lovey Newcomer M.D.   On: 09/14/2015 18:05     Medications:     Scheduled Medications: . sodium chloride   Intravenous Once  . allopurinol  100 mg Oral Daily  .  ceFAZolin (ANCEF) IV  1 g Intravenous Q24H  . citalopram  20 mg Oral Daily  . docusate sodium  200 mg Oral Daily  . feeding supplement (PRO-STAT SUGAR FREE 64)  30 mL Per Tube TID  . guaiFENesin  600 mg Oral BID  . hydrALAZINE  10 mg Intravenous Q4H  . magic mouthwash  10 mL Oral TID  . metoCLOPramide (REGLAN) injection  5 mg Intravenous Q6H  . pantoprazole (PROTONIX) IV  40 mg Intravenous Q12H  . pregabalin  75 mg Oral Daily  . rifampin  300 mg Oral Q8H  . sildenafil  20 mg Oral Q8H  . simethicone  80 mg Oral QID  . sodium chloride flush  10-40 mL Intracatheter Q12H  . sodium chloride flush  3 mL Intravenous Q12H  . tamsulosin  0.4 mg Oral Daily  . vancomycin  125 mg Oral Q8H    Infusions: . feeding supplement (NEPRO CARB STEADY) Stopped (09/16/15 0900)  . heparin 1,000 Units/hr (09/16/15 1000)    PRN Medications: sodium chloride, sodium chloride, sodium chloride, alteplase, bisacodyl **OR** bisacodyl, fentaNYL (  SUBLIMAZE) injection,  gelatin adsorbable, Gerhardt's butt cream, hydrALAZINE, ondansetron, oxyCODONE, sodium chloride flush, sodium chloride flush, traMADol   Assessment:   1. LVAD Complication- Driveline abscess 2. MSSA bacteremia -> septic shock 3. Acute/Chronic systolic HF s/p VAD placement 9/16 4. C. Difficile colitis 5. 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 Acute blood loss anemia 8. Severe Malnutrition.   9.. ARF 10. Klebsiella (ESBL) pneumonia 11. NSVT  12. Hypokalemia  13. RV Failure   Plan/Discussion:    S/P I&D LVAD driveline abscess. Exposed hardware. 09/09/15 S/P ABRA and reapplication of ACell. Adjustments per Plastics. Plan to return to OR tomorrow to close abdominal wound.  No coumadin yet, hold heparin in am.   WBC back up, no fevers. Remains on cefazolin for MSSA bacteremia and oral vanc for c.diff. Klebseilla PNA has been treated. ID following.   Stable co-ox at 79%.   Tolerating TFs but continues with nausea and vomiting, no clots this morning.  Hemoglobin stable. Has been seen by GI. Plan for EGD tomorrow.  However, also going to OR tomorrow.  Will need to coordinate with Dr Prescott Gum.  Continue IV Protonix.   Has RV failure. MAP remains high. Continue 20 mg sildenafil.  Increase hydralazine to 10 mg IV every 4 hrs.   Continue with scleral icterus. Bilirubin stably elevated. LDH ok so doubt hemolysis. Amiodarone on hold. Can consider RUQ u/s.   Some urine output but renal recovery lagging. Renal following. HD today.  VAD parameters ok. Hgb stable after transfusion. On low dose heparin. LDH ok.   PT following. Recommending CIR. CIR following.     Length of Stay: 27   Loralie Champagne MD  09/16/2015, 10:07 AM  VAD Team --- VAD ISSUES ONLY--- Pager [phone] (7am - 7am)  Advanced Heart Failure Team  Pager [phone] (M-F; 7a - 4p)  Please contact [facility] for night-coverage after hours (4p -7a ) and weekends on [email or web address]

## 2015-09-16 NOTE — Progress Notes (Signed)
ANTICOAGULATION CONSULT NOTE - Follow Up Consult  Pharmacy Consult for Heparin Indication: LVAD  Allergies  Allergen Reactions  . Phytonadione Other (See Comments)    Patient has LVAD: please check with LVAD coordinator on call or LVAD MD on call before reversal of anticoagulation with vit k    Patient Measurements: Height: 5\' 7"  (170.2 cm) Weight: 182 lb 1.6 oz (82.6 kg) IBW/kg (Calculated) : 66.1 Heparin Dosing Weight:  85 kg  Vital Signs: Temp: 98.2 F (36.8 C) (05/29 0800) Temp Source: Oral (05/29 0800) BP: 120/83 mmHg (05/29 0800) Pulse Rate: 87 (05/29 0800)  Labs:  Recent Labs  09/14/15 0515 09/15/15 0414 09/16/15 0409  HGB 7.8* 8.5* 8.7*  HCT 23.4* 26.1* 26.1*  PLT 164 201 201  HEPARINUNFRC <0.10* <0.10* <0.10*  CREATININE 4.02* 5.11* 6.23*    Estimated Creatinine Clearance: 15.1 mL/min (by C-G formula based on Cr of 6.23).  Assessment: 44 yom on Coumadin PTA for his LVAD, admitted with driveline abscess, sternal wound infection, and MSSA bacteremia. Home Coumadin held, and heparin bridge started 5/10 pending OR trips for wound debridement.   Heparin remains < 0.1 and bleeding continues to be an issue so MD has asked that rate not be increased. Will need to discuss rate adjustment and recommendations with Dr. Darcey Nora. S/p possible bleeding 5/24 with drop H/H received 2uts PRBC. Restarted heparin gtt 5/25 afternoon at 1000 unit/hr  - 5/29: HL cont to remain <0.1. Dr. Prescott Gum has stated to keep at 1000 units/hr.    Goal of Therapy:  Heparin level 0.3-0.4 eventually when more stable Monitor platelets by anticoagulation protocol: Yes   Plan:   - Continue heparin at 1000 units/hr and monitor for bleeding - No further up titration of heparin even with low HL - Daily heparin level to monitor for elevations and CBC   Onnie Boer, PharmD Pager: [phone] 09/16/2015 8:48 AM

## 2015-09-17 ENCOUNTER — Encounter (HOSPITAL_COMMUNITY)
Admission: AD | Disposition: A | Discharge status: Rehabilitation Facility | Payer: Self-pay | Source: Ambulatory Visit | Attending: Internal Medicine

## 2015-09-17 ENCOUNTER — Inpatient Hospital Stay (HOSPITAL_COMMUNITY): Payer: 59 | Admitting: Anesthesiology

## 2015-09-17 ENCOUNTER — Encounter (HOSPITAL_COMMUNITY): Payer: Self-pay | Admitting: Cardiothoracic Surgery

## 2015-09-17 ENCOUNTER — Inpatient Hospital Stay (HOSPITAL_COMMUNITY): Payer: 59

## 2015-09-17 DIAGNOSIS — I9789 Other postprocedural complications and disorders of the circulatory system, not elsewhere classified: Secondary | ICD-10-CM

## 2015-09-17 HISTORY — PX: STERNAL CLOSURE: SHX6203

## 2015-09-17 HISTORY — PX: ESOPHAGOGASTRODUODENOSCOPY: SHX5428

## 2015-09-17 LAB — URINALYSIS, ROUTINE W REFLEX MICROSCOPIC
Bilirubin Urine: NEGATIVE
Glucose, UA: NEGATIVE mg/dL
KETONES UR: NEGATIVE mg/dL
Nitrite: NEGATIVE
PROTEIN: 30 mg/dL — AB
Specific Gravity, Urine: 1.01 (ref 1.005–1.030)
pH: 7.5 (ref 5.0–8.0)

## 2015-09-17 LAB — DIFFERENTIAL
BASOS PCT: 0 %
Basophils Absolute: 0 10*3/uL (ref 0.0–0.1)
EOS ABS: 0.1 10*3/uL (ref 0.0–0.7)
Eosinophils Relative: 1 %
LYMPHS ABS: 1.1 10*3/uL (ref 0.7–4.0)
LYMPHS PCT: 5 %
MONO ABS: 1.6 10*3/uL — AB (ref 0.1–1.0)
Monocytes Relative: 8 %
Neutro Abs: 17.3 10*3/uL — ABNORMAL HIGH (ref 1.7–7.7)
Neutrophils Relative %: 86 %

## 2015-09-17 LAB — BASIC METABOLIC PANEL
Anion gap: 7 (ref 5–15)
BUN: 18 mg/dL (ref 6–20)
CALCIUM: 7.9 mg/dL — AB (ref 8.9–10.3)
CO2: 27 mmol/L (ref 22–32)
CREATININE: 2.97 mg/dL — AB (ref 0.61–1.24)
Chloride: 102 mmol/L (ref 101–111)
GFR, EST AFRICAN AMERICAN: 27 mL/min — AB (ref >60)
GFR, EST NON AFRICAN AMERICAN: 24 mL/min — AB (ref >60)
Glucose, Bld: 99 mg/dL (ref 65–99)
Potassium: 3.5 mmol/L (ref 3.5–5.1)
SODIUM: 136 mmol/L (ref 135–145)

## 2015-09-17 LAB — CBC
HCT: 25.5 % — ABNORMAL LOW (ref 39.0–52.0)
Hemoglobin: 8.3 g/dL — ABNORMAL LOW (ref 13.0–17.0)
MCH: 27.2 pg (ref 26.0–34.0)
MCHC: 32.5 g/dL (ref 30.0–36.0)
MCV: 83.6 fL (ref 78.0–100.0)
Platelets: 220 10*3/uL (ref 150–400)
RBC: 3.05 MIL/uL — ABNORMAL LOW (ref 4.22–5.81)
RDW: 20.9 % — ABNORMAL HIGH (ref 11.5–15.5)
WBC: 20 10*3/uL — ABNORMAL HIGH (ref 4.0–10.5)

## 2015-09-17 LAB — COMPREHENSIVE METABOLIC PANEL
ALT: 16 U/L — ABNORMAL LOW (ref 17–63)
AST: 66 U/L — AB (ref 15–41)
Albumin: 1.6 g/dL — ABNORMAL LOW (ref 3.5–5.0)
Alkaline Phosphatase: 153 U/L — ABNORMAL HIGH (ref 38–126)
Anion gap: 8 (ref 5–15)
BILIRUBIN TOTAL: 6.4 mg/dL — AB (ref 0.3–1.2)
BUN: 20 mg/dL (ref 6–20)
CALCIUM: 8.2 mg/dL — AB (ref 8.9–10.3)
CO2: 28 mmol/L (ref 22–32)
CREATININE: 3.41 mg/dL — AB (ref 0.61–1.24)
Chloride: 103 mmol/L (ref 101–111)
GFR calc Af Amer: 23 mL/min — ABNORMAL LOW (ref >60)
GFR, EST NON AFRICAN AMERICAN: 20 mL/min — AB (ref >60)
Glucose, Bld: 76 mg/dL (ref 65–99)
POTASSIUM: 3.8 mmol/L (ref 3.5–5.1)
Sodium: 139 mmol/L (ref 135–145)
TOTAL PROTEIN: 6 g/dL — AB (ref 6.5–8.1)

## 2015-09-17 LAB — URINE MICROSCOPIC-ADD ON

## 2015-09-17 LAB — GLUCOSE, CAPILLARY
GLUCOSE-CAPILLARY: 77 mg/dL (ref 65–99)
GLUCOSE-CAPILLARY: 68 mg/dL (ref 65–99)
GLUCOSE-CAPILLARY: 79 mg/dL (ref 65–99)
Glucose-Capillary: 240 mg/dL — ABNORMAL HIGH (ref 65–99)
Glucose-Capillary: 74 mg/dL (ref 65–99)
Glucose-Capillary: 71 mg/dL (ref 65–99)
Glucose-Capillary: 93 mg/dL (ref 65–99)

## 2015-09-17 LAB — PROTIME-INR
INR: 1.54 — AB (ref 0.00–1.49)
PROTHROMBIN TIME: 18.5 s — AB (ref 11.6–15.2)

## 2015-09-17 LAB — HEPARIN LEVEL (UNFRACTIONATED): Heparin Unfractionated: <0.1 IU/mL — ABNORMAL LOW (ref 0.30–0.70)

## 2015-09-17 LAB — LACTATE DEHYDROGENASE: LDH: 290 U/L — AB (ref 98–192)

## 2015-09-17 LAB — CARBOXYHEMOGLOBIN
CARBOXYHEMOGLOBIN: 2.9 % — AB (ref 0.5–1.5)
METHEMOGLOBIN: 1.1 % (ref 0.0–1.5)
O2 SAT: 79.8 %
TOTAL HEMOGLOBIN: 8.7 g/dL — AB (ref 13.5–18.0)

## 2015-09-17 LAB — MAGNESIUM: MAGNESIUM: 2 mg/dL (ref 1.7–2.4)

## 2015-09-17 SURGERY — ESOPHAGOGASTRODUODENOSCOPY (EGD) WITH PROPOFOL
Anesthesia: Monitor Anesthesia Care

## 2015-09-17 SURGERY — CLOSURE, STERNUM
Anesthesia: Monitor Anesthesia Care | Site: Abdomen

## 2015-09-17 MED ORDER — ONDANSETRON HCL 4 MG/2ML IJ SOLN: 4.0000 mg | Freq: Four times a day (QID) | INTRAMUSCULAR | Status: DC | PRN

## 2015-09-17 MED ORDER — FENTANYL CITRATE (PF) 100 MCG/2ML IJ SOLN
INTRAMUSCULAR | Status: DC | PRN
  Administered 2015-09-17: 50 ug via INTRAVENOUS

## 2015-09-17 MED ORDER — LIDOCAINE HCL (CARDIAC) 20 MG/ML IV SOLN
INTRAVENOUS | Status: DC | PRN
  Administered 2015-09-17: 30 mg via INTRAVENOUS

## 2015-09-17 MED ORDER — DEXTROSE 50 % IV SOLN
INTRAVENOUS | Status: AC
  Administered 2015-09-17: 25 mL
  Filled 2015-09-17: qty 50

## 2015-09-17 MED ORDER — SODIUM CHLORIDE 0.9 % IV SOLN
300.0000 mg | Freq: Two times a day (BID) | INTRAVENOUS | Status: DC
  Administered 2015-09-17 – 2015-09-23 (×13): 300 mg via INTRAVENOUS
  Filled 2015-09-17 (×16): qty 300

## 2015-09-17 MED ORDER — FENTANYL CITRATE (PF) 100 MCG/2ML IJ SOLN
25.0000 ug | INTRAMUSCULAR | Status: DC | PRN
  Administered 2015-09-17 – 2015-09-19 (×4): 50 ug via INTRAVENOUS
  Filled 2015-09-17 (×6): qty 2

## 2015-09-17 MED ORDER — MIDAZOLAM HCL 2 MG/2ML IJ SOLN
INTRAMUSCULAR | Status: AC
  Filled 2015-09-17: qty 2

## 2015-09-17 MED ORDER — LIDOCAINE HCL (PF) 1 % IJ SOLN
INTRAMUSCULAR | Status: AC
  Filled 2015-09-17: qty 30

## 2015-09-17 MED ORDER — PROPOFOL 500 MG/50ML IV EMUL
INTRAVENOUS | Status: DC | PRN
  Administered 2015-09-17: 25 ug/kg/min via INTRAVENOUS

## 2015-09-17 MED ORDER — MEPERIDINE HCL 25 MG/ML IJ SOLN: 6.2500 mg | INTRAMUSCULAR | Status: DC | PRN

## 2015-09-17 MED ORDER — SODIUM CHLORIDE 0.9 % IV SOLN: INTRAVENOUS | Status: DC

## 2015-09-17 MED ORDER — FENTANYL CITRATE (PF) 250 MCG/5ML IJ SOLN
INTRAMUSCULAR | Status: AC
  Filled 2015-09-17: qty 5

## 2015-09-17 MED ORDER — VANCOMYCIN HCL 1000 MG IV SOLR
INTRAVENOUS | Status: AC
  Administered 2015-09-17: 1000 mL
  Filled 2015-09-17: qty 1000

## 2015-09-17 MED ORDER — VANCOMYCIN HCL IN DEXTROSE 1-5 GM/200ML-% IV SOLN
INTRAVENOUS | Status: AC
  Administered 2015-09-17: 1000 mg via INTRAVENOUS
  Filled 2015-09-17: qty 200

## 2015-09-17 MED ORDER — DEXTROSE 50 % IV SOLN
25.0000 mL | Freq: Once | INTRAVENOUS | Status: AC
  Administered 2015-09-17: 25 mL via INTRAVENOUS
  Filled 2015-09-17: qty 50

## 2015-09-17 MED ORDER — FENTANYL CITRATE (PF) 100 MCG/2ML IJ SOLN: 25.0000 ug | INTRAMUSCULAR | Status: DC | PRN

## 2015-09-17 MED ORDER — LIDOCAINE HCL 1 % IJ SOLN
INTRAMUSCULAR | Status: DC | PRN
  Administered 2015-09-17: 6 mL

## 2015-09-17 MED ORDER — DEXTROSE 50 % IV SOLN
INTRAVENOUS | Status: AC
  Filled 2015-09-17: qty 50

## 2015-09-17 MED ORDER — MIDAZOLAM HCL 5 MG/5ML IJ SOLN
INTRAMUSCULAR | Status: DC | PRN
  Administered 2015-09-17 (×2): 1 mg via INTRAVENOUS

## 2015-09-17 MED ORDER — WARFARIN SODIUM 2.5 MG PO TABS
2.5000 mg | ORAL_TABLET | Freq: Every day | ORAL | Status: DC
  Administered 2015-09-17 – 2015-09-18 (×2): 2.5 mg via ORAL
  Filled 2015-09-17 (×2): qty 1

## 2015-09-17 MED ORDER — PROPOFOL 10 MG/ML IV BOLUS
INTRAVENOUS | Status: DC | PRN
  Administered 2015-09-17 (×2): 20 mg via INTRAVENOUS
  Administered 2015-09-17: 50 mg via INTRAVENOUS
  Administered 2015-09-17: 20 mg via INTRAVENOUS
  Administered 2015-09-17 (×2): 50 mg via INTRAVENOUS
  Administered 2015-09-17: 20 mg via INTRAVENOUS

## 2015-09-17 MED ORDER — WARFARIN - PHYSICIAN DOSING INPATIENT
Freq: Every day | Status: DC
Start: 2015-09-17 — End: 2015-09-23
  Administered 2015-09-20 – 2015-09-22 (×3)

## 2015-09-17 MED ORDER — ETOMIDATE 2 MG/ML IV SOLN
INTRAVENOUS | Status: AC
  Filled 2015-09-17: qty 10

## 2015-09-17 MED ORDER — PROCHLORPERAZINE EDISYLATE 5 MG/ML IJ SOLN
10.0000 mg | INTRAMUSCULAR | Status: DC | PRN
  Filled 2015-09-17: qty 2

## 2015-09-17 MED ORDER — PROPOFOL 10 MG/ML IV BOLUS
INTRAVENOUS | Status: AC
  Filled 2015-09-17: qty 20

## 2015-09-17 MED ORDER — LACTATED RINGERS IV SOLN: INTRAVENOUS | Status: DC

## 2015-09-17 MED ORDER — 0.9 % SODIUM CHLORIDE (POUR BTL) OPTIME
TOPICAL | Status: DC | PRN
  Administered 2015-09-17: 1000 mL

## 2015-09-17 MED ORDER — OXYCODONE HCL 5 MG PO TABS: 5.0000 mg | ORAL_TABLET | Freq: Once | ORAL | Status: DC | PRN

## 2015-09-17 MED ORDER — SODIUM CHLORIDE 0.9 % IV SOLN
300.0000 mg | Freq: Three times a day (TID) | INTRAVENOUS | Status: DC
  Filled 2015-09-17 (×3): qty 300

## 2015-09-17 MED ORDER — SODIUM CHLORIDE 0.9 % IV SOLN
INTRAVENOUS | Status: DC
  Administered 2015-09-17 – 2015-09-20 (×2): via INTRAVENOUS

## 2015-09-17 MED ORDER — STERILE WATER FOR IRRIGATION IR SOLN
Status: DC | PRN
  Administered 2015-09-17: 1000 mL

## 2015-09-17 MED ORDER — OXYCODONE HCL 5 MG/5ML PO SOLN: 5.0000 mg | Freq: Once | ORAL | Status: DC | PRN

## 2015-09-17 SURGICAL SUPPLY — 59 items
BAG DECANTER FOR FLEXI CONT (MISCELLANEOUS) IMPLANT
BENZOIN TINCTURE PRP APPL 2/3 (GAUZE/BANDAGES/DRESSINGS) IMPLANT
BLADE SAW SAG 29X58X.64 (BLADE) IMPLANT
CANISTER SUCTION 2500CC (MISCELLANEOUS) ×4 IMPLANT
CATH THORACIC 28FR RT ANG (CATHETERS) IMPLANT
CATH THORACIC 36FR (CATHETERS) IMPLANT
CATH THORACIC 36FR RT ANG (CATHETERS) IMPLANT
CONN Y 3/8X3/8X3/8  BEN (MISCELLANEOUS) ×2
CONN Y 3/8X3/8X3/8 BEN (MISCELLANEOUS) ×2 IMPLANT
CONT SPEC 4OZ CLIKSEAL STRL BL (MISCELLANEOUS) IMPLANT
DRAPE LAPAROSCOPIC ABDOMINAL (DRAPES) ×4 IMPLANT
DRAPE SLUSH/WARMER DISC (DRAPES) IMPLANT
DRSG AQUACEL AG ADV 3.5X14 (GAUZE/BANDAGES/DRESSINGS) ×4 IMPLANT
DRSG PAD ABDOMINAL 8X10 ST (GAUZE/BANDAGES/DRESSINGS) IMPLANT
ELECT REM PT RETURN 9FT ADLT (ELECTROSURGICAL) ×4
ELECTRODE REM PT RTRN 9FT ADLT (ELECTROSURGICAL) ×2 IMPLANT
GAUZE SPONGE 4X4 12PLY STRL (GAUZE/BANDAGES/DRESSINGS) ×4 IMPLANT
GAUZE XEROFORM 5X9 LF (GAUZE/BANDAGES/DRESSINGS) IMPLANT
GLOVE BIO SURGEON STRL SZ7.5 (GLOVE) ×8 IMPLANT
GLOVE BIOGEL PI IND STRL 6.5 (GLOVE) ×2 IMPLANT
GLOVE BIOGEL PI INDICATOR 6.5 (GLOVE) ×2
GOWN STRL REUS W/ TWL LRG LVL3 (GOWN DISPOSABLE) ×4 IMPLANT
GOWN STRL REUS W/TWL LRG LVL3 (GOWN DISPOSABLE) ×4
HEMOSTAT POWDER SURGIFOAM 1G (HEMOSTASIS) IMPLANT
HEMOSTAT SURGICEL 2X14 (HEMOSTASIS) IMPLANT
KIT BASIN OR (CUSTOM PROCEDURE TRAY) IMPLANT
KIT PREVENA INCISION MGT 13 (CANNISTER) ×4 IMPLANT
KIT ROOM TURNOVER OR (KITS) ×4 IMPLANT
KIT SUCTION CATH 14FR (SUCTIONS) IMPLANT
NEEDLE HYPO 25GX1X1/2 BEV (NEEDLE) ×4 IMPLANT
NS IRRIG 1000ML POUR BTL (IV SOLUTION) ×4 IMPLANT
PACK CHEST (CUSTOM PROCEDURE TRAY) IMPLANT
PACK GENERAL/GYN (CUSTOM PROCEDURE TRAY) ×4 IMPLANT
PAD ARMBOARD 7.5X6 YLW CONV (MISCELLANEOUS) ×8 IMPLANT
PIN SAFETY STERILE (MISCELLANEOUS) IMPLANT
RUBBERBAND STERILE (MISCELLANEOUS) IMPLANT
SPONGE LAP 18X18 X RAY DECT (DISPOSABLE) ×4 IMPLANT
STAPLER VISISTAT 35W (STAPLE) IMPLANT
STRAP MONTGOMERY 1.25X11-1/8 (MISCELLANEOUS) IMPLANT
SUT ETHILON 3 0 FSL (SUTURE) IMPLANT
SUT MON AB 2-0 CT1 36 (SUTURE) ×8 IMPLANT
SUT SILK  1 MH (SUTURE)
SUT SILK 1 MH (SUTURE) IMPLANT
SUT STEEL 6MS V (SUTURE) IMPLANT
SUT STEEL STERNAL CCS#1 18IN (SUTURE) IMPLANT
SUT STEEL SZ 6 DBL 3X14 BALL (SUTURE) IMPLANT
SUT STERNA BAND STERNOTOMY (SUTURE) IMPLANT
SUT VIC AB 1 CTX 18 (SUTURE) IMPLANT
SUT VIC AB 1 CTX 36 (SUTURE)
SUT VIC AB 1 CTX36XBRD ANBCTR (SUTURE) IMPLANT
SUT VIC AB 2-0 CTX 27 (SUTURE) IMPLANT
SUT VIC AB 3-0 X1 27 (SUTURE) IMPLANT
SWAB COLLECTION DEVICE MRSA (MISCELLANEOUS) ×4 IMPLANT
SYR CONTROL 10ML LL (SYRINGE) ×4 IMPLANT
SYSTEM SAHARA CHEST DRAIN ATS (WOUND CARE) ×4 IMPLANT
TOWEL OR 17X24 6PK STRL BLUE (TOWEL DISPOSABLE) ×4 IMPLANT
TOWEL OR 17X26 10 PK STRL BLUE (TOWEL DISPOSABLE) ×4 IMPLANT
TUBE ANAEROBIC SPECIMEN COL (MISCELLANEOUS) IMPLANT
WATER STERILE IRR 1000ML POUR (IV SOLUTION) ×4 IMPLANT

## 2015-09-17 NOTE — Significant Event (Signed)
Feeding tube was removed at EGD. Dr. Prescott Gum made aware. New orders to replace via cortrak.

## 2015-09-17 NOTE — Anesthesia Procedure Notes (Signed)
Procedure Name: MAC Date/Time: 09/17/2015 12:59 PM Performed by: Neldon Newport Pre-anesthesia Checklist: Patient being monitored, Suction available, Emergency Drugs available, Patient identified and Timeout performed Patient Re-evaluated:Patient Re-evaluated prior to inductionOxygen Delivery Method: Simple face mask

## 2015-09-17 NOTE — Anesthesia Preprocedure Evaluation (Addendum)
Anesthesia Evaluation  Patient identified by MRN, date of birth, ID band Patient awake    Reviewed: Allergy & Precautions, NPO status , Patient's Chart, lab work & pertinent test results  Airway Mallampati: II   Neck ROM: full    Dental  (+) Dental Advidsory Given, Teeth Intact   Pulmonary neg pulmonary ROS,    breath sounds clear to auscultation       Cardiovascular hypertension, On Medications +CHF  + Cardiac Defibrillator  Rhythm:regular Rate:Normal  LVAD   Neuro/Psych PSYCHIATRIC DISORDERS Depression    GI/Hepatic   Endo/Other  Hyperthyroidism   Renal/GU Renal InsufficiencyRenal disease     Musculoskeletal  (+) Arthritis , Rheumatoid disorders,    Abdominal   Peds  Hematology   Anesthesia Other Findings Bilateral upper extremity picc lines.  BP taken in Left lower extremety  Reproductive/Obstetrics                           Anesthesia Physical Anesthesia Plan  ASA: IV  Anesthesia Plan: MAC   Post-op Pain Management:    Induction: Intravenous  Airway Management Planned: Simple Face Mask  Additional Equipment:   Intra-op Plan:   Post-operative Plan:   Informed Consent: I have reviewed the patients History and Physical, chart, labs and discussed the procedure including the risks, benefits and alternatives for the proposed anesthesia with the patient or authorized representative who has indicated his/her understanding and acceptance.   Dental Advisory Given  Plan Discussed with: CRNA, Anesthesiologist and Surgeon  Anesthesia Plan Comments:       Anesthesia Quick Evaluation

## 2015-09-17 NOTE — Progress Notes (Signed)
Patient sent down to Pre-op with [name], VAD coordinator for abdominal wound closure. VAD supplies with patient, consent signed, CHG bath done.   Maia Petties, RN

## 2015-09-17 NOTE — Progress Notes (Signed)
HeartMate 2 Rounding Note  Subjective:    Patient feels better today Back to OR for wound closure of Pump pocket - sig clean granulation tissue but with some titanium still exposed. Wound covered with sub-AQ, skin with interrupted monopril mattress sutures   LVAD INTERROGATION:  HeartMate II LVAD:  Flow 4.8 liters/min, speed 9200, power 5.4 PI6.8.  Controller Serialconnected  Objective:    Vital Signs:   Temp:  [98.2 F (36.8 C)-98.8 F (37.1 C)] 98.5 F (36.9 C) (05/30 0700) Pulse Rate:  [80-104] 80 (05/30 1508) Resp:  [13-28] 13 (05/30 1508) BP: (88-127)/(61-98) 104/78 mmHg (05/30 1100) SpO2:  [98 %-100 %] 100 % (05/30 1508) Weight:  [184 lb 8.4 oz (83.7 kg)] 184 lb 8.4 oz (83.7 kg) (05/29 2056) Last BM Date: 09/17/15 Mean arterial Pressure 88  Intake/Output:   Intake/Output Summary (Last 24 hours) at 09/17/15 1510 Last data filed at 09/17/15 1420  Gross per 24 hour  Intake   1550 ml  Output    455 ml  Net   1095 ml     Physical Exam: General:  Well appearing. No resp difficulty HEENT: normal Neck: supple. JVP . Carotids 2+ bilat; no bruits. No lymphadenopathy or thryomegaly appreciated. Cor: Mechanical heart sounds with LVAD hum present. Lungs: clear Abdomen: soft, nontender, nondistended. No hepatosplenomegaly. No bruits or masses. Good bowel sounds. Extremities: no cyanosis, clubbing, rash, edema Neuro: alert & orientedx3, cranial nerves grossly intact. moves all 4 extremities w/o difficulty. Affect pleasant  Telemetry  nsr  Labs: Basic Metabolic Panel:  Recent Labs Lab 09/11/15 0820  09/14/15 0515 09/14/15 [id number] 09/15/15 0414 09/16/15 0409 09/16/15 2318 09/16/15 2330 09/17/15 0501  NA 136  < > 138  --  139 138 141 136 139  K 3.2*  < > 3.4*  --  3.6 3.7 3.6 3.5 3.8  CL 103  < > 102  --  104 106 99* 102 103  CO2 22  < > 27  --  25 22  --  27 28  GLUCOSE 88  < > 92  --  110* 105* 97 99 76  BUN 50*  < > 25*  --  40* 51* 19 18 20   CREATININE 6.76*  <  > 4.02*  --  5.11* 6.23* 2.80* 2.97* 3.41*  CALCIUM 8.1*  < > 8.4*  --  8.4* 8.6*  --  7.9* 8.2*  MG 2.7*  --   --  2.2  --   --   --  2.0  --   PHOS  --   --   --   --  3.4 3.9  --   --   --   < > = values in this interval not displayed.  Liver Function Tests:  Recent Labs Lab 09/12/15 0245 09/13/15 0455 09/14/15 0515 09/15/15 0414 09/15/15 1347 09/16/15 0409 09/17/15 0501  AST 78* 95* 74*  --  71*  --  66*  ALT 25 37 30  --  22  --  16*  ALKPHOS 168* 136* 161*  --  178*  --  153*  BILITOT 8.8* 9.5* 7.5*  --  7.1*  --  6.4*  PROT 6.2* 5.9* 6.0*  --  6.0*  --  6.0*  ALBUMIN 1.6* 1.5* 1.5* 1.5* 1.6* 1.6* 1.6*   No results for input(s): LIPASE, AMYLASE in the last 168 hours. No results for input(s): AMMONIA in the last 168 hours.  CBC:  Recent Labs Lab 09/14/15 0515 09/15/15 0414 09/16/15 0409 09/16/15  2318 09/16/15 2330 09/17/15 0501 09/17/15 1132  WBC 16.2* 17.0* 19.1*  --  18.0* 20.0*  --   NEUTROABS 13.7*  --   --   --   --   --  17.3*  HGB 7.8* 8.5* 8.7* 9.5* 8.2* 8.3*  --   HCT 23.4* 26.1* 26.1* 28.0* 24.5* 25.5*  --   MCV 81.5 81.3 81.1  --  83.3 83.6  --   PLT 164 201 201  --  201 220  --     INR: pending  Recent Labs Lab 09/17/15 0501  INR 1.54*    Other results:  EKG:   Imaging: Dg Abd Portable 2v  09/16/2015  CLINICAL DATA:  Feeding tube placement.  Evaluate for ileus. EXAM: PORTABLE ABDOMEN - 2 VIEW COMPARISON:  09/14/2015 FINDINGS: Supine and right-sided decubitus views. Incompletely imaged ventricular assist device. Feeding tube terminates at the distal duodenum/duodenal jejunal junction. No significant bowel distention. Distal gas identified in the rectum. Right upper quadrant clips are likely related to cholecystectomy. No abnormal abdominal calcifications. No appendicolith. Right-sided decubitus view demonstrates no free intraperitoneal air. Minimal air-fluid levels are identified, at least partially of which are within the colon. IMPRESSION:  No specific findings to suggest bowel obstruction or ileus. No bowel distension. There are a few nonspecific fluid levels on decubitus positioning. Electronically Signed   By: Abigail Miyamoto M.D.   On: 09/16/2015 11:27      Medications:     Scheduled Medications: . sodium chloride   Intravenous Once  . allopurinol  100 mg Oral Daily  .  ceFAZolin (ANCEF) IV  1 g Intravenous Q24H  . citalopram  20 mg Oral Daily  . dextrose      . docusate sodium  200 mg Oral Daily  . feeding supplement (PRO-STAT SUGAR FREE 64)  30 mL Per Tube TID  . guaiFENesin  600 mg Oral BID  . magic mouthwash  10 mL Oral TID  . metoCLOPramide (REGLAN) injection  5 mg Intravenous Q6H  . pantoprazole (PROTONIX) IV  40 mg Intravenous Q12H  . pregabalin  75 mg Oral Daily  . rifampin (RIFADIN) IVPB  300 mg Intravenous Q8H  . sildenafil  20 mg Oral Q8H  . simethicone  80 mg Oral QID  . sodium chloride flush  10-40 mL Intracatheter Q12H  . tamsulosin  0.4 mg Oral Daily  . vancomycin  125 mg Oral Q8H  . warfarin  2.5 mg Oral q1800     Infusions: . sodium chloride 10 mL/hr at 09/17/15 1213  . feeding supplement (NEPRO CARB STEADY) Stopped (09/16/15 2300)     PRN Medications:  sodium chloride, sodium chloride, alteplase, bisacodyl **OR** bisacodyl, fentaNYL (SUBLIMAZE) injection, gelatin adsorbable, Gerhardt's butt cream, hydrALAZINE, ondansetron, oxyCODONE, sodium chloride flush, traMADol   Assessment:  Abdominal wound clean, closed and Preveena wound system applied [for 7 days] Coumadin to resume this pm Hold iv heparin until am and check INR before resuming heparin   Plan/Discussion:   Sepsis with MSSA on Ancef ATN on HD Colitis, gastric outlet obstruction on TF Deconditioned - start PT   I reviewed the LVAD parameters from today, and compared the results to the patient's prior recorded data.  No programming changes were made.  The LVAD is functioning within specified parameters.  The patient performs  LVAD self-test daily.  LVAD interrogation was negative for any significant power changes, alarms or PI events/speed drops.  LVAD equipment check completed and is in good working order.  Back-up  equipment present.   LVAD education done on emergency procedures and precautions and reviewed exit site care.  Length of Stay: [place] III 09/17/2015, 3:10 PM

## 2015-09-17 NOTE — Progress Notes (Signed)
Subjective: Interval History: has complaints N,V.  Objective: Vital signs in last 24 hours: Temp:  [97.4 F (36.3 C)-98.8 F (37.1 C)] 98.7 F (37.1 C) (05/30 0400) Pulse Rate:  [84-104] 104 (05/30 0700) Resp:  [13-28] 16 (05/30 0700) BP: (88-133)/(61-98) 127/76 mmHg (05/30 0700) SpO2:  [98 %-100 %] 99 % (05/30 0700) Weight:  [83.7 kg (184 lb 8.4 oz)] 83.7 kg (184 lb 8.4 oz) (05/29 2056) Weight change: 1.1 kg (2 lb 6.8 oz)  Intake/Output from previous day: 05/29 0701 - 05/30 0700 In: 1460 [I.V.:710; NG/GT:750] Out: 790 [Urine:690; Emesis/NG output:100] Intake/Output this shift:    General appearance: alert and cooperative Resp: diminished breath sounds bilaterally and rales bibasilar Cardio: S1, S2 normal, systolic murmur: holosystolic 2/6, blowing at apex and cont hum of LVAD GI: distended, few bs, tender, dressing midline, LVAD line out mid abdm on R Extremities: edema 1+  Lab Results:  Recent Labs  09/16/15 2330 09/17/15 0501  WBC 18.0* 20.0*  HGB 8.2* 8.3*  HCT 24.5* 25.5*  PLT 201 220   BMET:  Recent Labs  09/16/15 2330 09/17/15 0501  NA 136 139  K 3.5 3.8  CL 102 103  CO2 27 28  GLUCOSE 99 76  BUN 18 20  CREATININE 2.97* 3.41*  CALCIUM 7.9* 8.2*   No results for input(s): PTH in the last 72 hours. Iron Studies: No results for input(s): IRON, TIBC, TRANSFERRIN, FERRITIN in the last 72 hours.  Studies/Results: Dg Abd Portable 2v  09/16/2015  CLINICAL DATA:  Feeding tube placement.  Evaluate for ileus. EXAM: PORTABLE ABDOMEN - 2 VIEW COMPARISON:  09/14/2015 FINDINGS: Supine and right-sided decubitus views. Incompletely imaged ventricular assist device. Feeding tube terminates at the distal duodenum/duodenal jejunal junction. No significant bowel distention. Distal gas identified in the rectum. Right upper quadrant clips are likely related to cholecystectomy. No abnormal abdominal calcifications. No appendicolith. Right-sided decubitus view demonstrates no  free intraperitoneal air. Minimal air-fluid levels are identified, at least partially of which are within the colon. IMPRESSION: No specific findings to suggest bowel obstruction or ileus. No bowel distension. There are a few nonspecific fluid levels on decubitus positioning. Electronically Signed   By: Abigail Miyamoto M.D.   On: 09/16/2015 11:27    I have reviewed the patient's current medications.  Assessment/Plan: 1 AKI presumed  Nonoliguric but not clear onset of function.  Did well with HD.some xs vol 2 CM with LVAD 3 Infx LVAD on Ancef, Rif 4 Anemia stable 5 Abdm ?? etio  For endo, has ongoing ileus P follow chem HD as indic, AB, endo,     LOS: 22 days   Brittian Renaldo L 09/17/2015,7:24 AM

## 2015-09-17 NOTE — Progress Notes (Signed)
Patient ID: [name], male   DOB: 22-Apr-1970, 45 y.o.   MRN: [id number]  SICU Evening Rounds:  Hemodynamically stable after wound closure today.  Ambulated around ICU tonight.

## 2015-09-17 NOTE — Significant Event (Addendum)
Ron with phlebotomy reports to nursing staff of difficulty in trying to get a peripheral stick for blood cultures ordered this morning. He stated their team have tried at least eight times today and by different phlebotomy staff. RN communicated this with [name]; also paged Amy Clegg regarding this. RN changed the time collection per recommendation by Ron to the next shift for possibly getting a different phlebotomy to try to get the blood culture. RN relay this information to Dr. Cyndia Bent who is on call tonight.

## 2015-09-17 NOTE — Progress Notes (Addendum)
CT Surgery  The patient was examined and preop studies reviewed. There has been no change from the prior exam and the patient is ready for surgery.  plan closure of abdominal wound- iv conscious sedation

## 2015-09-17 NOTE — Clinical Social Work Note (Signed)
CSW continuing to follow for support and discharge needs. Shonny Bohdi Leeds, LCSW [phone] 

## 2015-09-17 NOTE — Progress Notes (Signed)
    [facility]   Reason for visit: Follow up on drive line infection, C diff, aki  Interval History:   Afebrile. Though persistent n/v/lack of appetite. Sitting up in chair after ambulation  24 hr events: underwent EGD -which was normal except possibly GOO/gastroparesis and abd wound closure  Physical Exam: Constitutional:  Filed Vitals:   09/17/15 1100 09/17/15 1508  BP: 104/78 121/100  Pulse:  80  Temp:    Resp: 16 13   GEN: watching TV, patient appears in NAD HEENT: slight scleral icterus Cors: LVAD generator sounds Respiratory: Normal respiratory effort; CTA B anterior exam GI: soft, nt, + BS, bandaged MS: no edema Skin: no rashes   Review of Systems: + n/v/loss of appetite  Lab Results  Component Value Date   WBC 20.0* 09/17/2015   HGB 8.3* 09/17/2015   HCT 25.5* 09/17/2015   MCV 83.6 09/17/2015   PLT 220 09/17/2015    Lab Results  Component Value Date   CREATININE 3.41* 09/17/2015   BUN 20 09/17/2015   NA 139 09/17/2015   K 3.8 09/17/2015   CL 103 09/17/2015   CO2 28 09/17/2015    Lab Results  Component Value Date   ALT 16* 09/17/2015   AST 66* 09/17/2015   ALKPHOS 153* 09/17/2015     Microbiology: Recent Results (from the past 240 hour(s))  C difficile quick scan w PCR reflex     Status: None   Collection Time: 09/15/15  9:25 AM  Result Value Ref Range Status   C Diff antigen NEGATIVE NEGATIVE Final   C Diff toxin NEGATIVE NEGATIVE Final   C Diff interpretation Negative for toxigenic C. difficile  Final    Impression/Plan:  1. MSSA Drive line infection - continue on cefazolin and continue on rifampin. Will change rifampin back to IV as that is a probable contributor to  2. Aspiration pneumonia - treated 3.  C diff -  He has 2 BM per day. Currently on a taper of 125mg  TID 4. AKi = continue with renally dosed cefazolin, appears to have some improvement.  5. Abdominal wound = closed today, will continue to monitor 6.  Leukocytosis = though no fever. Will repeat cultures today. No apparent sources noted thus far 7. Nausea/vomiting = possibly related to rifampin. Will change to IV therapy Q12 hr to see if it helps with symptoms  [facility]ins [facility] [phone]

## 2015-09-17 NOTE — Progress Notes (Signed)
ANTICOAGULATION CONSULT NOTE - Follow Up Consult  Pharmacy Consult for Heparin Indication: LVAD  Allergies  Allergen Reactions  . Phytonadione Other (See Comments)    Patient has LVAD: please check with LVAD coordinator on call or LVAD MD on call before reversal of anticoagulation with vit k    Patient Measurements: Height: 5\' 7"  (170.2 cm) Weight: 184 lb 8.4 oz (83.7 kg) IBW/kg (Calculated) : 66.1 Heparin Dosing Weight:  85 kg  Vital Signs: Temp: 98.5 F (36.9 C) (05/30 0700) Temp Source: Oral (05/30 0700) BP: 113/86 mmHg (05/30 1000) Pulse Rate: 104 (05/30 0700)  Labs:  Recent Labs  09/15/15 0414 09/16/15 0409 09/16/15 2318 09/16/15 2330 09/17/15 0501  HGB 8.5* 8.7* 9.5* 8.2* 8.3*  HCT 26.1* 26.1* 28.0* 24.5* 25.5*  PLT 201 201  --  201 220  LABPROT  --   --   --   --  18.5*  INR  --   --   --   --  1.54*  HEPARINUNFRC <0.10* <0.10*  --   --  <0.10*  CREATININE 5.11* 6.23* 2.80* 2.97* 3.41*    Estimated Creatinine Clearance: 27.7 mL/min (by C-G formula based on Cr of 3.41).  Assessment: 11 yom on Coumadin PTA for his LVAD, admitted with driveline abscess, sternal wound infection, and MSSA bacteremia. Home Coumadin held, and heparin bridge started 5/10 pending OR trips for wound debridement.   Heparin remains < 0.1 and bleeding continues to be an issue so MD has asked that rate not be increased.   Heparin now off for procedure this am.  Goal of Therapy:  Heparin level 0.3-0.4 eventually when more stable Monitor platelets by anticoagulation protocol: Yes   Plan:  Follow up restart of anticoagulation plan after procedures today  Erin Hearing PharmD., BCPS Clinical Pharmacist Pager [phone] 09/17/2015 11:15 AM

## 2015-09-17 NOTE — Progress Notes (Signed)
Patient ID: [name], male   DOB: 02/28/1971, 45 y.o.   MRN: [id number]    HeartMate 2 Rounding Note  Subjective:    Admitted from [facility] in [place] with driveline abscess. Blood cultures --> MSSA and has C diff. Remains on Nafcillin  and oral vancomycin.   08/27/15 S/P I &D Epigastric with hardware exposed and VAC placement.  5/11 repeat I&D and wound vac replacement.  5/11 TEE no vegetations on valves or ICD wire 5/22 S/P ABRA and Acell   Has had ongoing nausea/vomiting. Unable to take anything po without throwing up. Continues to throw up pebble-sized well formed clots of dark blood. Able to take feeds post-pylorically without problem. On IV protonix and zofran, KUB ok.   No fevers. He is on rifampin, cefazolin, and po vancomycin.   UOP 670. Yesterday had HD. Kept even. Following HD PI dropped to 5.5-6 with MAP 58. Given 250 cc bolus. CVP 12. WBC trending up again. Creatinine trending up 2.97>3.4.   Denies nausea this morning.    VAD INTERROGATION:  HeartMate II LVAD:  Flow 4.7   liters/min, speed 9200, power 5.3  PI 7.5, no PI events.     Objective:    Vital Signs:   Temp:  [97.4 F (36.3 C)-98.8 F (37.1 C)] 98.5 F (36.9 C) (05/30 0700) Pulse Rate:  [84-104] 104 (05/30 0700) Resp:  [13-28] 14 (05/30 0800) BP: (88-133)/(61-98) 110/80 mmHg (05/30 0800) SpO2:  [98 %-100 %] 100 % (05/30 0800) Weight:  [184 lb 8.4 oz (83.7 kg)] 184 lb 8.4 oz (83.7 kg) (05/29 2056) Last BM Date: 09/16/15 Mean arterial Pressure  88-101.   Intake/Output:   Intake/Output Summary (Last 24 hours) at 09/17/15 0923 Last data filed at 09/17/15 0900  Gross per 24 hour  Intake   1340 ml  Output    645 ml  Net    695 ml     Physical Exam: CVP 12 General: In bed. NAD HEENT: Scleral icterus +panda Neck: supple. Carotids 2+ bilat; no bruits. No lymphadenopathy or thryomegaly appreciated. R subclavian trialysis cath dressing dry Cor: Mechanical heart sounds with LVAD  hum present.Irregular.  Lungs: clear on  Abdomen: soft, tender,  + distended. + bowel sounds. Mid epigastric dressing ok.   Driveline: C/D/I; securement device intact and driveline incorporated Extremities: no cyanosis, clubbing, rash, trace edema  Neuro: A & O x3  Telemetry:  A Flutter 90s.     Labs: Basic Metabolic Panel:  Recent Labs Lab 09/11/15 0820  09/14/15 0515 09/14/15 [id number] 09/15/15 0414 09/16/15 0409 09/16/15 2318 09/16/15 2330 09/17/15 0501  NA 136  < > 138  --  139 138 141 136 139  K 3.2*  < > 3.4*  --  3.6 3.7 3.6 3.5 3.8  CL 103  < > 102  --  104 106 99* 102 103  CO2 22  < > 27  --  25 22  --  27 28  GLUCOSE 88  < > 92  --  110* 105* 97 99 76  BUN 50*  < > 25*  --  40* 51* 19 18 20   CREATININE 6.76*  < > 4.02*  --  5.11* 6.23* 2.80* 2.97* 3.41*  CALCIUM 8.1*  < > 8.4*  --  8.4* 8.6*  --  7.9* 8.2*  MG 2.7*  --   --  2.2  --   --   --  2.0  --   PHOS  --   --   --   --  3.4 3.9  --   --   --   < > = values in this interval not displayed.  Liver Function Tests:  Recent Labs Lab 09/12/15 0245 09/13/15 0455 09/14/15 0515 09/15/15 0414 09/15/15 1347 09/16/15 0409 09/17/15 0501  AST 78* 95* 74*  --  71*  --  66*  ALT 25 37 30  --  22  --  16*  ALKPHOS 168* 136* 161*  --  178*  --  153*  BILITOT 8.8* 9.5* 7.5*  --  7.1*  --  6.4*  PROT 6.2* 5.9* 6.0*  --  6.0*  --  6.0*  ALBUMIN 1.6* 1.5* 1.5* 1.5* 1.6* 1.6* 1.6*   No results for input(s): LIPASE, AMYLASE in the last 168 hours. No results for input(s): AMMONIA in the last 168 hours.  CBC:  Recent Labs Lab 09/14/15 0515 09/15/15 0414 09/16/15 0409 09/16/15 2318 09/16/15 2330 09/17/15 0501  WBC 16.2* 17.0* 19.1*  --  18.0* 20.0*  NEUTROABS 13.7*  --   --   --   --   --   HGB 7.8* 8.5* 8.7* 9.5* 8.2* 8.3*  HCT 23.4* 26.1* 26.1* 28.0* 24.5* 25.5*  MCV 81.5 81.3 81.1  --  83.3 83.6  PLT 164 201 201  --  201 220    INR:  Recent Labs Lab 09/17/15 0501  INR 1.54*    Other  results:    Imaging: Dg Abd Portable 2v  09/16/2015  CLINICAL DATA:  Feeding tube placement.  Evaluate for ileus. EXAM: PORTABLE ABDOMEN - 2 VIEW COMPARISON:  09/14/2015 FINDINGS: Supine and right-sided decubitus views. Incompletely imaged ventricular assist device. Feeding tube terminates at the distal duodenum/duodenal jejunal junction. No significant bowel distention. Distal gas identified in the rectum. Right upper quadrant clips are likely related to cholecystectomy. No abnormal abdominal calcifications. No appendicolith. Right-sided decubitus view demonstrates no free intraperitoneal air. Minimal air-fluid levels are identified, at least partially of which are within the colon. IMPRESSION: No specific findings to suggest bowel obstruction or ileus. No bowel distension. There are a few nonspecific fluid levels on decubitus positioning. Electronically Signed   By: Abigail Miyamoto M.D.   On: 09/16/2015 11:27     Medications:     Scheduled Medications: . sodium chloride   Intravenous Once  . allopurinol  100 mg Oral Daily  .  ceFAZolin (ANCEF) IV  1 g Intravenous Q24H  . citalopram  20 mg Oral Daily  . docusate sodium  200 mg Oral Daily  . feeding supplement (PRO-STAT SUGAR FREE 64)  30 mL Per Tube TID  . guaiFENesin  600 mg Oral BID  . magic mouthwash  10 mL Oral TID  . metoCLOPramide (REGLAN) injection  5 mg Intravenous Q6H  . pantoprazole (PROTONIX) IV  40 mg Intravenous Q12H  . pregabalin  75 mg Oral Daily  . rifampin  300 mg Oral Q8H  . sildenafil  20 mg Oral Q8H  . simethicone  80 mg Oral QID  . sodium chloride flush  10-40 mL Intracatheter Q12H  . tamsulosin  0.4 mg Oral Daily  . vancomycin  125 mg Oral Q8H    Infusions: . feeding supplement (NEPRO CARB STEADY) Stopped (09/16/15 2300)    PRN Medications: sodium chloride, sodium chloride, alteplase, bisacodyl **OR** bisacodyl, fentaNYL (SUBLIMAZE) injection, gelatin adsorbable, Gerhardt's butt cream, hydrALAZINE,  ondansetron, oxyCODONE, sodium chloride flush, traMADol   Assessment:   1. LVAD Complication- Driveline abscess 2. MSSA bacteremia -> septic shock 3. Acute/Chronic systolic HF  s/p VAD placement 9/16 4. C. Difficile colitis 5. 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 Acute blood loss anemia 8. Severe Malnutrition.   9.. ARF 10. Klebsiella (ESBL) pneumonia 11. NSVT  12. Hypokalemia  13. RV Failure  14. Malnutrition   Plan/Discussion:    S/P I&D LVAD driveline abscess. Exposed hardware. 09/09/15 S/P ABRA and reapplication of ACell. Adjustments per Plastics. Plan to return to OR for closure.  No coumadin yet, hold heparin in am.   WBC back up, no fevers. Remains on cefazolin + rifampin for MSSA bacteremia and oral vanc for c.diff. Klebseilla PNA has been treated. Discussed with Dr Baxter Flattery. Stop po rifampin due to poor po intake and switch IV rifampin. Question whether or not oral rifampin is contributing to nausea.  Repeat Pan Cultures.  For now hold off on fungal coverage.   Stable co-ox 80%.   Tolerating TFs but continues with nausea and vomiting, no clots this morning.  Hemoglobin stable 8.3. Plan for EGD today. Continue IV Protonix. Albumin 1.6.   Has RV failure. MAPs 89-101. Continue 20 mg sildenafil.  Continue hydralazine 10 mg IV every 4 hrs as needed.    Continue with scleral icterus. Total bilirubin coming down. LDH ok so doubt hemolysis. Amiodarone on hold. Can consider RUQ u/s.   Renal following. Had HD yesterday   VAD parameters ok. Hgb 8.3. Off heparin for EGD. LDH ok.   PT following. Recommending CIR. CIR following.   ? Afib/A flutter- EKG now. Off amio for now with elevated LFTs.     Length of Stay: [place] NP-C  09/17/2015, 9:23 AM  VAD Team --- VAD ISSUES ONLY--- Pager [phone] (7am - 7am)  Advanced Heart Failure Team  Pager [phone] (M-F; 7a - 4p)  Please contact [facility] for night-coverage after hours (4p -7a )  and weekends on [email or web address]  Patient seen and examined with Darrick Grinder, NP. We discussed all aspects of the encounter. I agree with the assessment and plan as stated above.   Making slow progress. No further hematemsis. Tolerated HD well last night. For EGD today and possible wound culture.   WBC trending up. Will discuss with ID and ask about possible fungal coverage.  Kidney recovery is slow. Making about 20-30 cc/hr. Continue HD as needed for solute clearance.   MAPs up. Continue prn hydralazine. VAD parameters ok. Back in NSR. Continue low-dose heparin.   Pebble Botkin,MD 11:22 AM

## 2015-09-17 NOTE — Brief Op Note (Signed)
08/26/2015 - 09/17/2015  2:59 PM  PATIENT:  [name]  45 y.o. male  PRE-OPERATIVE DIAGNOSIS:Open   ABDOMINAL WOUND   POST-OPERATIVE DIAGNOSIS:  ABDOMINAL WOUND INFECTION; EGD: Possible gastric outlet obstruction, no active bleeding  PROCEDURE:  Procedure(s): ABDOMINAL WOUND CLOSURE WITH INCISIONAL VAC APPLICATION (N/A) ESOPHAGOGASTRODUODENOSCOPY (EGD) (N/A)  SURGEON:  Surgeon(s) and Role: Panel 1:    * Ivin Poot, MD - Primary  Panel 2:    * Carol Ada, MD - Primary  PHYSICIAN ASSISTANT: nonwe  ASSISTANTS: none   ANESTHESIA:   IV sedation , local 1 % lidocaine  EBL:  Total I/O In: 680 [I.V.:680] Out: 80 [Urine:80]  BLOOD ADMINISTERED:none  DRAINS: none   LOCAL MEDICATIONS USED:  LIDOCAINE  and Amount: 10 ml  SPECIMEN:  No Specimen  DISPOSITION OF SPECIMEN:  N/A  COUNTS:  YES  TOURNIQUET:  * No tourniquets in log *  DICTATION: .Dragon Dictation  PLAN OF CARE: return to room 2 S  PATIENT DISPOSITION:  PACU - hemodynamically stable.   Delay start of Pharmacological VTE agent (>24hrs) due to surgical blood loss or risk of bleeding: yes

## 2015-09-18 ENCOUNTER — Encounter (HOSPITAL_COMMUNITY): Payer: Self-pay | Admitting: Cardiothoracic Surgery

## 2015-09-18 ENCOUNTER — Inpatient Hospital Stay (HOSPITAL_COMMUNITY): Payer: 59

## 2015-09-18 DIAGNOSIS — I5023 Acute on chronic systolic (congestive) heart failure: Secondary | ICD-10-CM

## 2015-09-18 DIAGNOSIS — Z95811 Presence of heart assist device: Secondary | ICD-10-CM

## 2015-09-18 LAB — GLUCOSE, CAPILLARY
GLUCOSE-CAPILLARY: 103 mg/dL — AB (ref 65–99)
GLUCOSE-CAPILLARY: 79 mg/dL (ref 65–99)
GLUCOSE-CAPILLARY: 84 mg/dL (ref 65–99)
Glucose-Capillary: 85 mg/dL (ref 65–99)
Glucose-Capillary: 93 mg/dL (ref 65–99)
Glucose-Capillary: 100 mg/dL — ABNORMAL HIGH (ref 65–99)

## 2015-09-18 LAB — CBC
HCT: 24.1 % — ABNORMAL LOW (ref 39.0–52.0)
HCT: 25.8 % — ABNORMAL LOW (ref 39.0–52.0)
HEMATOCRIT: 23.9 % — AB (ref 39.0–52.0)
HEMOGLOBIN: 7.7 g/dL — AB (ref 13.0–17.0)
Hemoglobin: 7.9 g/dL — ABNORMAL LOW (ref 13.0–17.0)
Hemoglobin: 8.4 g/dL — ABNORMAL LOW (ref 13.0–17.0)
MCH: 27.1 pg (ref 26.0–34.0)
MCH: 27.1 pg (ref 26.0–34.0)
MCH: 27 pg (ref 26.0–34.0)
MCHC: 32.8 g/dL (ref 30.0–36.0)
MCHC: 32.2 g/dL (ref 30.0–36.0)
MCHC: 32.6 g/dL (ref 30.0–36.0)
MCV: 82.8 fL (ref 78.0–100.0)
MCV: 84.2 fL (ref 78.0–100.0)
MCV: 83 fL (ref 78.0–100.0)
PLATELETS: 230 10*3/uL (ref 150–400)
PLATELETS: 227 10*3/uL (ref 150–400)
Platelets: 218 10*3/uL (ref 150–400)
RBC: 2.91 MIL/uL — ABNORMAL LOW (ref 4.22–5.81)
RBC: 2.84 MIL/uL — ABNORMAL LOW (ref 4.22–5.81)
RBC: 3.11 MIL/uL — ABNORMAL LOW (ref 4.22–5.81)
RDW: 21.4 % — AB (ref 11.5–15.5)
RDW: 21.4 % — AB (ref 11.5–15.5)
RDW: 20.8 % — AB (ref 11.5–15.5)
WBC: 17.5 10*3/uL — ABNORMAL HIGH (ref 4.0–10.5)
WBC: 17.8 10*3/uL — ABNORMAL HIGH (ref 4.0–10.5)
WBC: 16.1 10*3/uL — AB (ref 4.0–10.5)

## 2015-09-18 LAB — IRON AND TIBC
IRON: 34 ug/dL — AB (ref 45–182)
Saturation Ratios: 21 % (ref 17.9–39.5)
TIBC: 164 ug/dL — AB (ref 250–450)
UIBC: 130 ug/dL

## 2015-09-18 LAB — CARBOXYHEMOGLOBIN
CARBOXYHEMOGLOBIN: 2.3 % — AB (ref 0.5–1.5)
METHEMOGLOBIN: 1.6 % — AB (ref 0.0–1.5)
O2 SAT: 73.1 %
TOTAL HEMOGLOBIN: 8.4 g/dL — AB (ref 13.5–18.0)

## 2015-09-18 LAB — RENAL FUNCTION PANEL
Albumin: 1.6 g/dL — ABNORMAL LOW (ref 3.5–5.0)
Anion gap: 10 (ref 5–15)
BUN: 35 mg/dL — ABNORMAL HIGH (ref 6–20)
CO2: 25 mmol/L (ref 22–32)
Calcium: 8.7 mg/dL — ABNORMAL LOW (ref 8.9–10.3)
Chloride: 103 mmol/L (ref 101–111)
Creatinine, Ser: 4.79 mg/dL — ABNORMAL HIGH (ref 0.61–1.24)
GFR calc Af Amer: 15 mL/min — ABNORMAL LOW (ref >60)
GFR calc non Af Amer: 13 mL/min — ABNORMAL LOW (ref >60)
Glucose, Bld: 83 mg/dL (ref 65–99)
Phosphorus: 4.8 mg/dL — ABNORMAL HIGH (ref 2.5–4.6)
Potassium: 3.7 mmol/L (ref 3.5–5.1)
Sodium: 138 mmol/L (ref 135–145)

## 2015-09-18 LAB — COMPREHENSIVE METABOLIC PANEL
ALK PHOS: 183 U/L — AB (ref 38–126)
ALT: 17 U/L (ref 17–63)
ANION GAP: 9 (ref 5–15)
AST: 69 U/L — ABNORMAL HIGH (ref 15–41)
Albumin: 1.6 g/dL — ABNORMAL LOW (ref 3.5–5.0)
BILIRUBIN TOTAL: 5.7 mg/dL — AB (ref 0.3–1.2)
BUN: 37 mg/dL — ABNORMAL HIGH (ref 6–20)
CALCIUM: 8.8 mg/dL — AB (ref 8.9–10.3)
CO2: 26 mmol/L (ref 22–32)
CREATININE: 4.96 mg/dL — AB (ref 0.61–1.24)
Chloride: 104 mmol/L (ref 101–111)
GFR calc non Af Amer: 13 mL/min — ABNORMAL LOW (ref >60)
GFR, EST AFRICAN AMERICAN: 15 mL/min — AB (ref >60)
GLUCOSE: 98 mg/dL (ref 65–99)
Potassium: 3.5 mmol/L (ref 3.5–5.1)
Sodium: 139 mmol/L (ref 135–145)
TOTAL PROTEIN: 6.9 g/dL (ref 6.5–8.1)

## 2015-09-18 LAB — LACTATE DEHYDROGENASE: LDH: 289 U/L — AB (ref 98–192)

## 2015-09-18 LAB — URINE CULTURE

## 2015-09-18 LAB — PROTIME-INR
INR: 1.62 — ABNORMAL HIGH (ref 0.00–1.49)
Prothrombin Time: 19.3 seconds — ABNORMAL HIGH (ref 11.6–15.2)

## 2015-09-18 LAB — PREPARE RBC (CROSSMATCH)

## 2015-09-18 MED ORDER — PANTOPRAZOLE SODIUM 40 MG PO TBEC
40.0000 mg | DELAYED_RELEASE_TABLET | Freq: Every day | ORAL | Status: DC
  Administered 2015-09-18 – 2015-09-19 (×2): 40 mg via ORAL
  Filled 2015-09-18 (×3): qty 1

## 2015-09-18 MED ORDER — SODIUM CHLORIDE 0.9 % IV SOLN
Freq: Once | INTRAVENOUS | Status: AC
  Administered 2015-09-18: 17:00:00 via INTRAVENOUS

## 2015-09-18 MED ORDER — HYDRALAZINE HCL 10 MG PO TABS
10.0000 mg | ORAL_TABLET | Freq: Three times a day (TID) | ORAL | Status: DC
  Administered 2015-09-18 – 2015-09-19 (×3): 10 mg via ORAL
  Filled 2015-09-18 (×2): qty 1

## 2015-09-18 MED ORDER — PANTOPRAZOLE SODIUM 40 MG IV SOLR: 40.0000 mg | INTRAVENOUS | Status: DC

## 2015-09-18 MED ORDER — VANCOMYCIN 50 MG/ML ORAL SOLUTION
125.0000 mg | Freq: Two times a day (BID) | ORAL | Status: DC
  Administered 2015-09-18 – 2015-09-25 (×14): 125 mg via ORAL
  Filled 2015-09-18 (×16): qty 2.5

## 2015-09-18 MED ORDER — ALTEPLASE 2 MG IJ SOLR
4.0000 mg | Freq: Once | INTRAMUSCULAR | Status: DC
  Filled 2015-09-18: qty 4

## 2015-09-18 NOTE — Progress Notes (Signed)
1 Day Post-Op Procedure(s) (LRB): ABDOMINAL WOUND CLOSURE WITH INCISIONAL VAC APPLICATION (N/A) ESOPHAGOGASTRODUODENOSCOPY (EGD) (N/A) Subjective: Stable overnite abd still distebnded - has resumed TF abd dressing dry Walked 300 ft this am WBC better on IV rifampin INR 1.6 - resumed coumadin, hold iv heparin Objective: Vital signs in last 24 hours: Temp:  [98.2 F (36.8 C)-98.5 F (36.9 C)] 98.5 F (36.9 C) (05/31 0740) Pulse Rate:  [80-91] 86 (05/31 0600) Cardiac Rhythm:  [-] Normal sinus rhythm (05/30 2000) Resp:  [11-22] 11 (05/31 0600) BP: (103-123)/(78-106) 110/99 mmHg (05/31 0600) SpO2:  [97 %-100 %] 100 % (05/31 0600) Weight:  [182 lb 1.6 oz (82.6 kg)] 182 lb 1.6 oz (82.6 kg) (05/31 0100)  Hemodynamic parameters for last 24 hours:   Nsr, afebrile Intake/Output from previous day: 05/30 0701 - 05/31 0700 In: 1177.8 [I.V.:712.8; NG/GT:365; IV Piggyback:100] Out: 785 [Urine:785] Intake/Output this shift:         Exam    General- alert and comfortable   Lungs- clear without rales, wheezes   Cor- regular rate and rhythm, no murmur , gallop   Abdomen- distended, non-tender   Extremities - warm, non-tender, mildl edema   Neuro- oriented, appropriate, no focal weakness   Lab Results:  Recent Labs  09/17/15 0501 09/18/15 0438  WBC 20.0* 17.5*  HGB 8.3* 7.9*  HCT 25.5* 24.1*  PLT 220 230   BMET:  Recent Labs  09/17/15 0501 09/18/15 0438  NA 139 138  K 3.8 3.7  CL 103 103  CO2 28 25  GLUCOSE 76 83  BUN 20 35*  CREATININE 3.41* 4.79*  CALCIUM 8.2* 8.7*    PT/INR:  Recent Labs  09/18/15 0438  LABPROT 19.3*  INR 1.62*   ABG    Component Value Date/Time   PHART 7.332* 09/09/2015 1145   HCO3 20.2 09/09/2015 1145   TCO2 26 09/16/2015 2318   ACIDBASEDEF 5.0* 09/09/2015 1145   O2SAT 73.1 09/18/2015 0440   CBG (last 3)   Recent Labs  09/17/15 2011 09/17/15 2332 09/18/15 0351  GLUCAP 93 84 79    Assessment/Plan: S/P Procedure(s)  (LRB): ABDOMINAL WOUND CLOSURE WITH INCISIONAL VAC APPLICATION (N/A) ESOPHAGOGASTRODUODENOSCOPY (EGD) (N/A) HD today Cont coumadin 2.5 -  Daily   follow LFTs for ++ bilirubin Leave Preveena incisional VAC until Mon Antibiotics per ID   LOS: 23 days    [name] 09/18/2015

## 2015-09-18 NOTE — Anesthesia Postprocedure Evaluation (Signed)
Anesthesia Post Note  Patient: [name]  Procedure(s) Performed: Procedure(s) (LRB): ABDOMINAL WOUND CLOSURE WITH INCISIONAL VAC APPLICATION (N/A) ESOPHAGOGASTRODUODENOSCOPY (EGD) (N/A)  Patient location during evaluation: PACU Anesthesia Type: MAC Level of consciousness: awake and alert Pain management: pain level controlled Vital Signs Assessment: post-procedure vital signs reviewed and stable Respiratory status: spontaneous breathing, nonlabored ventilation, respiratory function stable and patient connected to nasal cannula oxygen Cardiovascular status: stable and blood pressure returned to baseline Anesthetic complications: no    Last Vitals:  Filed Vitals:   09/18/15 0800 09/18/15 0900  BP: 108/90 105/84  Pulse: 86   Temp:    Resp: 15 14    Last Pain:  Filed Vitals:   09/18/15 0915  PainSc: [place] S

## 2015-09-18 NOTE — Progress Notes (Signed)
Nutrition Follow Up  DOCUMENTATION CODES:   Not applicable  INTERVENTION:    Continue Nepro formula at goal rate of 45 ml/hr with Prostat liquid protein 30 ml TID via tube  Total TF regimen to provide 2244 kcals, 132 gm protein, 785 ml of free water daily  NUTRITION DIAGNOSIS:   Inadequate oral intake related to poor appetite as evidenced by  (RN report), ongoing  GOAL:   Patient will meet greater than or equal to 90% of their needs, met  MONITOR:   TF tolerance, PO intake, Labs, Weight trends, I & O's  ASSESSMENT:   45 yo Male admitted for treatment of a sternal / chest wound. The patient is currently in the Unit and recovering from surgery earlier this week. He had an LVAD placed. He had a trip to [place] and came back with concerns of infection at the site of the pump. He was taken to the OR for debridement. There was purulence at the site. Irrigation and debridement was done. Acell and the VAC was placed. He is recovering now. Attempts are being made to see if he is a candidate for transplant. Will need coverage over the pump. Pictures seen and significant amount exposed.  Patient s/p procedures 5/22: DEBRIDEMENT AND CLOSURE WOUND WITH PLACEMENT OF ABRA CLOSURE DEVICE  Patient s/p procedure 5/30: CLOSURE OF ABDOMINAL WOUND WITH INCISIONAL VAC APPLICATION   Pt currently NPO. Nepro formula currently infusing via CORTRAK small bore feeding tube at goal rate of 45 ml/hr; also receiving Prostat liquid protein TID. Nephrology following for HD. ABRA device removed.  Diet Order:  Diet NPO time specified  Skin:  Wound (see comment) (chest wound)  Last BM:  5/30  Height:   Ht Readings from Last 1 Encounters:  08/26/15 _0  (1.702 m)    Weight:   Wt Readings from Last 1 Encounters:  09/18/15 182 lb 1.6 oz (82.6 kg)    Ideal Body Weight:  67.2 kg  BMI:  Body mass index is 28.51 kg/(m^2).  Estimated Nutritional Needs:   Kcal:  2100-2300  Protein:   120-130 gm  Fluid:  per MD  EDUCATION NEEDS:   No education needs identified at this time  Arthur Holms, RD, LDN Pager #: [phone] After-Hours Pager #: [phone]

## 2015-09-18 NOTE — Op Note (Signed)
NAME:  [name], MARCHANT NO.:  [id number]  MEDICAL RECORD NO.:  [id number]  LOCATION:  2S10C                        FACILITY:  [facility]  PHYSICIAN:  Ivin Poot, M.D.  DATE OF BIRTH:  [birth year]  DATE OF PROCEDURE:  09/17/2015 DATE OF DISCHARGE:                              OPERATIVE REPORT   OPERATION:  Closure of abdominal wound.  SURGEON:  Ivin Poot, M.D.  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  ANESTHESIA:  IV conscious sedation with local 1% lidocaine.  DESCRIPTION OF PROCEDURE:  The patient was brought directly from the ICU to the operating room for abdominal wound closure.  The patient had been treated for the past 2 weeks with antibiotics, wound dressings, wound VAC and topical ACell powder to optimize healing over an exposed portion of his LVAD pump from a pump pocket infection.  The patient was placed supine on the operating room table where he was given monitored IV conscious sedation by the Anesthesia Team.  The chest and abdomen were prepped and draped as a sterile field.  A proper time- out was performed.  Local 1% lidocaine was infiltrated around the margins of the incision. The incision was cultured.  The incision was irrigated with antibiotic- vancomycin irrigation.  It was cleaned with granulation tissue almost, but not completely covering the exposed titanium hardware.  The edges of the incision were sharply debrided.  Next, I placed interrupted 2-0 Monocryl sutures with horizontal mattress suture technique to reapproximate the incision with full-thickness bites.  In between, I placed 3-0 Monocryl sutures to pull the skin over the exposed titanium and the pump pocket.  The wound was then irrigated and dried.  A Preventa wound pad was then placed over the incision  and connected to suction.  The patient after undergoing endoscopy was then transported back to the ICU.  He tolerated the procedure well.    Ivin Poot, M.D.    PV/MEDQ  D:  09/17/2015  T:  09/18/2015  Job:  [id number]

## 2015-09-18 NOTE — Progress Notes (Signed)
Notified MD of K 3.5, no replacement ordered, patient to have HD tomorrow.  Rowe Pavy, RN

## 2015-09-18 NOTE — Progress Notes (Signed)
    [facility]   Reason for visit: Follow up on MSSA drive line/LVAD infection, C diff, aki  Interval History:   Afebrile. sleeping  24 hr events: s/p EGD -which was normal except possibly GOO/gastroparesis and abd wound closure. Ambulated 35ft today  Physical Exam: Constitutional:  Filed Vitals:   09/18/15 0900 09/18/15 0930  BP: 105/84 119/84  Pulse:  84  Temp:    Resp: 14 14   GEN: sleeping, patient appears in NAD Cors: LVAD generator sounds Respiratory: Normal respiratory effort; CTA B anterior exam GI: soft, nt, + BS, bandaged MS: no edema Skin: no rashes   Review of Systems: + n/v/loss of appetite  Lab Results  Component Value Date   WBC 17.5* 09/18/2015   HGB 7.9* 09/18/2015   HCT 24.1* 09/18/2015   MCV 82.8 09/18/2015   PLT 230 09/18/2015    Lab Results  Component Value Date   CREATININE 4.79* 09/18/2015   BUN 35* 09/18/2015   NA 138 09/18/2015   K 3.7 09/18/2015   CL 103 09/18/2015   CO2 25 09/18/2015    Lab Results  Component Value Date   ALT 16* 09/17/2015   AST 66* 09/17/2015   ALKPHOS 153* 09/17/2015     Microbiology: Recent Results (from the past 240 hour(s))  C difficile quick scan w PCR reflex     Status: None   Collection Time: 09/15/15  9:25 AM  Result Value Ref Range Status   C Diff antigen NEGATIVE NEGATIVE Final   C Diff toxin NEGATIVE NEGATIVE Final   C Diff interpretation Negative for toxigenic C. difficile  Final  Aerobic Culture (superficial specimen) (NOT AT [facility])     Status: None (Preliminary result)   Collection Time: 09/17/15  1:24 PM  Result Value Ref Range Status   Specimen Description WOUND ABDOMEN  Final   Special Requests PATIENT ON FOLLOWING  VANCOMYCIN  Final   Gram Stain   Final    ABUNDANT WBC PRESENT,BOTH PMN AND MONONUCLEAR NO ORGANISMS SEEN    Culture PENDING  Incomplete   Report Status PENDING  Incomplete    Impression/Plan:  1. MSSA Drive line infection - continue on  cefazolin and continue on rifampin. Will change rifampin back to IV as that is a probable contributor to his N/V 2. Clostridium difficile - will switch him to 125mg  oral vanco bid as part of prolonged taper 3. gOO on EGD with symptoms of gastric fullness, n/v = consider low dose reglan. 4. AKi = slight increase in creatinine. May need to increase cefazolin to 2gm IV daily if cr function continues to imprvoe 5. Abdominal wound = closed yesterday, will continue to monitor 6. Leukocytosis = slightly improved. Continue to monitor for any changes to suggest super-imposed infection 7. Nausea/vomiting = possibly related to rifampin. Will continue on IV therapy Q12 hr to see if it helps with symptoms  Caren Griffins B. [facility] [phone]

## 2015-09-18 NOTE — Op Note (Signed)
[facility] Patient Name: [name] Procedure Date : 09/17/2015 MRN: [id number] Attending MD: Carol Ada , MD Date of Birth: [birth year] CSN: [id number] Age: 45 Admit Type: Inpatient Procedure:                Upper GI endoscopy Indications:              Hematemesis, Nausea with vomiting Providers:                Carol Ada, MD, Carolynn Comment, RN, Cherylynn Ridges, Technician Referring MD:              Medicines:                Propofol per Anesthesia Complications:            No immediate complications. Estimated Blood Loss:     Estimated blood loss: none. Procedure:                Pre-Anesthesia Assessment:                           - Prior to the procedure, a History and Physical                            was performed, and patient medications and                            allergies were reviewed. The patient's tolerance of                            previous anesthesia was also reviewed. The risks                            and benefits of the procedure and the sedation                            options and risks were discussed with the patient.                            All questions were answered, and informed consent                            was obtained. Prior Anticoagulants: The patient has                            taken heparin, last dose was day of procedure. ASA                            Grade Assessment: IV - A patient with severe                            systemic disease that is a constant threat to life.  After reviewing the risks and benefits, the patient                            was deemed in satisfactory condition to undergo the                            procedure.                           - Sedation was administered by an anesthesia                            professional. Deep sedation was attained.                           After obtaining informed consent, the endoscope  was                            passed under direct vision. Throughout the                            procedure, the patient's blood pressure, pulse, and                            oxygen saturations were monitored continuously. The                            [id number]) scope was introduced through the                            mouth, and advanced to the third part of duodenum.                            The upper GI endoscopy was accomplished without                            difficulty. The patient tolerated the procedure                            well. Scope In: Scope Out: Findings:      The esophagus was normal.      The stomach was normal.      The examined duodenum was normal.      Subjectively the antrum appeared to be spasmed. It moderately difficult       to advance the endoscope from the gastric lumen into the duodenum. No       evidence of ulcerastions, erosions, or vascular abnormalities. No       evidence of any bleeding from the upper GI tract. Essentially the       patient may have a functional gastric outlet obstruction versus       gastroparesis. A mild melanosis was noted in the gastric lumen and       duodenal mucosa. Impression:               - Normal esophagus.                           -  Normal stomach.                           - Normal examined duodenum.                           - No specimens collected. Moderate Sedation:      None Recommendation:           - Return patient to hospital ward for ongoing care.                           - Resume previous diet.                           - Continue present medications.                           - Trial of Reglan. He cannot undergo a gastric                            emptying scan to confirm or refute the suspicion of                            gastroparesis.                           - Reinsert feeding tube per primary service. Procedure Code(s):        --- Professional ---                            [id number], Esophagogastroduodenoscopy, flexible,                            transoral; diagnostic, including collection of                            specimen(s) by brushing or washing, when performed                            (separate procedure) Diagnosis Code(s):        --- Professional ---                           K92.0, Hematemesis                           R11.2, Nausea with vomiting, unspecified CPT copyright 2016 American Medical Association. All rights reserved. The codes documented in this report are preliminary and upon coder review may  be revised to meet current compliance requirements. Carol Ada, MD Carol Ada, MD 09/17/2015 2:27:58 PM This report has been signed electronically. Number of Addenda: 0

## 2015-09-18 NOTE — Transfer of Care (Signed)
Immediate Anesthesia Transfer of Care Note  Patient: TYONE HUEY  Procedure(s) Performed: Procedure(s): ABDOMINAL WOUND CLOSURE WITH INCISIONAL VAC APPLICATION (N/A) ESOPHAGOGASTRODUODENOSCOPY (EGD) (N/A)  Patient Location: SICU  Anesthesia Type:MAC  Level of Consciousness: awake, alert  and oriented  Airway & Oxygen Therapy: Patient Spontanous Breathing and Patient connected to nasal cannula oxygen  Post-op Assessment: Report given to RN, Post -op Vital signs reviewed and stable and Patient moving all extremities X 4  Post vital signs: Reviewed and stable  Last Vitals:  Filed Vitals:   09/18/15 0800 09/18/15 0900  BP: 108/90 105/84  Pulse: 86   Temp:    Resp: 15 14    Last Pain:  Filed Vitals:   09/18/15 0915  PainSc: Asleep      Patients Stated Pain Goal: 3 ([id number])  Complications: No apparent anesthesia complications

## 2015-09-18 NOTE — Progress Notes (Signed)
Patient ID: [name], male   DOB: 07/04/1970, 45 y.o.   MRN: [id number]    HeartMate 2 Rounding Note  Subjective:    Admitted from [facility] in [place] with driveline abscess. Blood cultures --> MSSA and has C diff. Remains on Nafcillin  and oral vancomycin.   08/27/15 S/P I &D Epigastric with hardware exposed and VAC placement.  5/11 repeat I&D and wound vac replacement.  5/11 TEE no vegetations on valves or ICD wire 5/22 S/P ABRA and Acell  5/30 EGD- normal. Wound Closure.   Making 30-40 cc urine. Ambulated 300 feet. No nausea today.    VAD INTERROGATION:  HeartMate II LVAD:  Flow 4.7   liters/min, speed 9200, power 5.4  PI 7.8, no PI events.     Objective:    Vital Signs:   Temp:  [98.2 F (36.8 C)-98.5 F (36.9 C)] 98.5 F (36.9 C) (05/31 0740) Pulse Rate:  [80-91] 84 (05/31 0930) Resp:  [11-22] 14 (05/31 0930) BP: (103-123)/(78-106) 119/84 mmHg (05/31 0930) SpO2:  [97 %-100 %] 99 % (05/31 0930) Weight:  [182 lb 1.6 oz (82.6 kg)] 182 lb 1.6 oz (82.6 kg) (05/31 0100) Last BM Date: 09/17/15 Mean arterial Pressure  92-105.   Intake/Output:   Intake/Output Summary (Last 24 hours) at 09/18/15 1006 Last data filed at 09/18/15 0800  Gross per 24 hour  Intake 1217.83 ml  Output    795 ml  Net 422.83 ml     Physical Exam: General: In bed. NAD HEENT: Scleral icterus +panda Neck: supple. Carotids 2+ bilat; no bruits. No lymphadenopathy or thryomegaly appreciated. R subclavian trialysis cath dressing dry Cor: Mechanical heart sounds with LVAD hum present.Irregular.  Lungs: clear on  Abdomen: soft, tender,  + distended. + bowel sounds. Incisional Vac in place.    Driveline: C/D/I; securement device intact and driveline incorporated Extremities: no cyanosis, clubbing, rash, trace edema  Neuro: A & O x3  Telemetry:  NSR 80s.      Labs: Basic Metabolic Panel:  Recent Labs Lab 09/14/15 0906 09/15/15 0414 09/16/15 0409 09/16/15 2318  09/16/15 2330 09/17/15 0501 09/18/15 0438 09/18/15 0835  NA  --  139 138 141 136 139 138 139  K  --  3.6 3.7 3.6 3.5 3.8 3.7 3.5  CL  --  104 106 99* 102 103 103 104  CO2  --  25 22  --  27 28 25 26   GLUCOSE  --  110* 105* 97 99 76 83 98  BUN  --  40* 51* 19 18 20  35* 37*  CREATININE  --  5.11* 6.23* 2.80* 2.97* 3.41* 4.79* 4.96*  CALCIUM  --  8.4* 8.6*  --  7.9* 8.2* 8.7* 8.8*  MG 2.2  --   --   --  2.0  --   --   --   PHOS  --  3.4 3.9  --   --   --  4.8*  --     Liver Function Tests:  Recent Labs Lab 09/13/15 0455 09/14/15 0515  09/15/15 1347 09/16/15 0409 09/17/15 0501 09/18/15 0438 09/18/15 0835  AST 95* 74*  --  71*  --  66*  --  69*  ALT 37 30  --  22  --  16*  --  17  ALKPHOS 136* 161*  --  178*  --  153*  --  183*  BILITOT 9.5* 7.5*  --  7.1*  --  6.4*  --  5.7*  PROT 5.9* 6.0*  --  6.0*  --  6.0*  --  6.9  ALBUMIN 1.5* 1.5*  < > 1.6* 1.6* 1.6* 1.6* 1.6*  < > = values in this interval not displayed. No results for input(s): LIPASE, AMYLASE in the last 168 hours. No results for input(s): AMMONIA in the last 168 hours.  CBC:  Recent Labs Lab 09/14/15 0515 09/15/15 0414 09/16/15 0409 09/16/15 2318 09/16/15 2330 09/17/15 0501 09/17/15 1132 09/18/15 0438  WBC 16.2* 17.0* 19.1*  --  18.0* 20.0*  --  17.5*  NEUTROABS 13.7*  --   --   --   --   --  17.3*  --   HGB 7.8* 8.5* 8.7* 9.5* 8.2* 8.3*  --  7.9*  HCT 23.4* 26.1* 26.1* 28.0* 24.5* 25.5*  --  24.1*  MCV 81.5 81.3 81.1  --  83.3 83.6  --  82.8  PLT 164 201 201  --  201 220  --  230    INR:  Recent Labs Lab 09/17/15 0501 09/18/15 0438  INR 1.54* 1.62*    Other results:    Imaging: Dg Chest Port 1 View  09/18/2015  CLINICAL DATA:  Shortness of breath. EXAM: PORTABLE CHEST 1 VIEW COMPARISON:  09/15/2015. FINDINGS: Feeding tube and right subclavian line in stable position. Cardiac pacer in stable position. Prior CABG. Left ventricular assist device in stable position. Left atrial appendage  clip in stable position. Stable cardiomegaly. Low lung volumes with mild bibasilar atelectasis. No pleural effusion or pneumothorax. IMPRESSION: 1. Lines and tubes in stable position. Cardiac pacer left ventricular assist device in stable position. Prior CABG. Left atrial appendage clip in stable position. 2. Low lung volumes with mild bibasilar atelectasis. Electronically Signed   By: Marcello Moores  Register   On: 09/18/2015 08:23   Dg Abd Portable 1v  09/17/2015  CLINICAL DATA:  Nasogastric tube placement. EXAM: PORTABLE ABDOMEN - 1 VIEW COMPARISON:  None. FINDINGS: The bowel gas pattern is normal. Distal tip of nasogastric tube appears to be in the expected position of second portion of the duodenum. IMPRESSION: Distal tip of nasogastric tube appears to be in expected position of second portion of the duodenum. Electronically Signed   By: Marijo Conception, M.D.   On: 09/17/2015 16:10   Dg Abd Portable 2v  09/16/2015  CLINICAL DATA:  Feeding tube placement.  Evaluate for ileus. EXAM: PORTABLE ABDOMEN - 2 VIEW COMPARISON:  09/14/2015 FINDINGS: Supine and right-sided decubitus views. Incompletely imaged ventricular assist device. Feeding tube terminates at the distal duodenum/duodenal jejunal junction. No significant bowel distention. Distal gas identified in the rectum. Right upper quadrant clips are likely related to cholecystectomy. No abnormal abdominal calcifications. No appendicolith. Right-sided decubitus view demonstrates no free intraperitoneal air. Minimal air-fluid levels are identified, at least partially of which are within the colon. IMPRESSION: No specific findings to suggest bowel obstruction or ileus. No bowel distension. There are a few nonspecific fluid levels on decubitus positioning. Electronically Signed   By: Abigail Miyamoto M.D.   On: 09/16/2015 11:27     Medications:     Scheduled Medications: . sodium chloride   Intravenous Once  . allopurinol  100 mg Oral Daily  .  ceFAZolin (ANCEF)  IV  1 g Intravenous Q24H  . citalopram  20 mg Oral Daily  . docusate sodium  200 mg Oral Daily  . feeding supplement (PRO-STAT SUGAR FREE 64)  30 mL Per Tube TID  . magic mouthwash  10 mL Oral TID  .  metoCLOPramide (REGLAN) injection  5 mg Intravenous Q6H  . pantoprazole (PROTONIX) IV  40 mg Intravenous Q24H  . pregabalin  75 mg Oral Daily  . rifampin (RIFADIN) IVPB  300 mg Intravenous Q12H  . sildenafil  20 mg Oral Q8H  . simethicone  80 mg Oral QID  . sodium chloride flush  10-40 mL Intracatheter Q12H  . tamsulosin  0.4 mg Oral Daily  . vancomycin  125 mg Oral Q12H  . warfarin  2.5 mg Oral q1800  . Warfarin - Physician Dosing Inpatient   Does not apply q1800    Infusions: . sodium chloride Stopped (09/17/15 1530)  . feeding supplement (NEPRO CARB STEADY) 1,000 mL (09/18/15 0800)    PRN Medications: sodium chloride, sodium chloride, alteplase, bisacodyl **OR** bisacodyl, fentaNYL (SUBLIMAZE) injection, fentaNYL (SUBLIMAZE) injection, gelatin adsorbable, Gerhardt's butt cream, hydrALAZINE, ondansetron, ondansetron (ZOFRAN) IV, oxyCODONE, prochlorperazine, sodium chloride flush, traMADol   Assessment:   1. LVAD Complication- Driveline abscess 2. MSSA bacteremia -> septic shock 3. Acute/Chronic systolic HF s/p VAD placement 9/16 4. C. Difficile colitis 5. 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 Acute blood loss anemia 8. Severe Malnutrition.   9.. ARF 10. Klebsiella (ESBL) pneumonia 11. NSVT  12. Hypokalemia  13. RV Failure  14. Malnutrition   Plan/Discussion:    S/P I&D LVAD driveline abscess. Exposed hardware. 09/09/15 S/P ABRA and reapplication of ACell. ABR removed 5/30 and wound closed by Dr Darcey Nora. On Coumadin.    WBC trending down.  Remains on cefazolin + rifampin for MSSA bacteremia and oral vanc for c.diff. Klebseilla PNA has been treated. Off po rifampin due to poor po intake. Continue IV rifampin. Question whether or not oral  rifampin is contributing to nausea.  Blood cultures.  NGTD. Urine cultures pending. For now hold off on fungal coverage. Dr Baxter Flattery following closely  Stable co-ox 73%.   Tolerating TFs. No nausea today.  EGD normal. Hgb 7.9. Continue IV Protonix. Albumin 1.6.   Has RV failure. MAPs 92-105. Continue 20 mg sildenafil.  Start 10 mg po hydralazine tid.     Continue with scleral icterus. Total bilirubin coming down. LDH ok so doubt hemolysis. Amiodarone on hold. Can consider RUQ u/s.   Renal following. Plan for HD tomorrow.    VAD parameters ok. Hgb 7.9. On coumadin per Dr Lawson Fiscal.  LDH ok. Repeat CBC at 1300. If Hgb lower will give a unit of blood.   PT following. Recommending CIR. CIR following.   NSR today.  Off amio for now with elevated LFTs.     Length of Stay: [place] NP-C  09/18/2015, 10:06 AM  VAD Team --- VAD ISSUES ONLY--- Pager [phone] (7am - 7am)  Advanced Heart Failure Team  Pager [phone] (M-F; 7a - 4p)  Please contact [facility] for night-coverage after hours (4p -7a ) and weekends on [email or web address]  Addendum --> Hgb 7.7 Give 1unit PRBCs. Repeat CBC post transfusion. Amy Clegg NP-C  2:14 PM  Continues to make slow progress. Abdominal wound now closed. WBC coming down on current abx (switch to IV rifampin).. Hgb still low. EGD ok. Will give one more unit RBCs today. Continue low-dose heparin. Starting coumadin. Making some urine but still requiring HD. Hopeful that he will continue to have renal recovery. Volume status ok. VAD parameters stable.   Glori Bickers MD

## 2015-09-18 NOTE — Progress Notes (Addendum)
Called Darrick Grinder, NP to ask if patient can go outside with RN, Amy gave verbal ok as long as patient was stable.  VS stable, patient ambulated 300 ft with no problems.  Rowe Pavy, RN

## 2015-09-18 NOTE — Progress Notes (Signed)
Subjective: Interval History: has complaints sore around wound.  Objective: Vital signs in last 24 hours: Temp:  [98.2 F (36.8 C)-98.5 F (36.9 C)] 98.3 F (36.8 C) (05/31 0400) Pulse Rate:  [80-91] 86 (05/31 0600) Resp:  [11-22] 11 (05/31 0600) BP: (103-123)/(78-106) 110/99 mmHg (05/31 0600) SpO2:  [97 %-100 %] 100 % (05/31 0600) Weight:  [82.6 kg (182 lb 1.6 oz)] 82.6 kg (182 lb 1.6 oz) (05/31 0100) Weight change: -1.1 kg (-2 lb 6.8 oz)  Intake/Output from previous day: 05/30 0701 - 05/31 0700 In: 1177.8 [I.V.:712.8; NG/GT:365; IV Piggyback:100] Out: 785 [Urine:785] Intake/Output this shift:    General appearance: alert, cooperative and no distress Resp: diminished breath sounds bilaterally and rales bibasilar Chest wall: R [place]  cath Cardio: S1, S2 normal, systolic murmur: holosystolic 2/6, blowing at apex and cont hum of LVAD GI: mod distension, pos bs, , wound dressing and LVAD cord R side abdm Extremities: edema 2+  Lab Results:  Recent Labs  09/17/15 0501 09/18/15 0438  WBC 20.0* 17.5*  HGB 8.3* 7.9*  HCT 25.5* 24.1*  PLT 220 230   BMET:  Recent Labs  09/17/15 0501 09/18/15 0438  NA 139 138  K 3.8 3.7  CL 103 103  CO2 28 25  GLUCOSE 76 83  BUN 20 35*  CREATININE 3.41* 4.79*  CALCIUM 8.2* 8.7*   No results for input(s): PTH in the last 72 hours. Iron Studies: No results for input(s): IRON, TIBC, TRANSFERRIN, FERRITIN in the last 72 hours.  Studies/Results: Dg Abd Portable 1v  09/17/2015  CLINICAL DATA:  Nasogastric tube placement. EXAM: PORTABLE ABDOMEN - 1 VIEW COMPARISON:  None. FINDINGS: The bowel gas pattern is normal. Distal tip of nasogastric tube appears to be in the expected position of second portion of the duodenum. IMPRESSION: Distal tip of nasogastric tube appears to be in expected position of second portion of the duodenum. Electronically Signed   By: Marijo Conception, M.D.   On: 09/17/2015 16:10   Dg Abd Portable 2v  09/16/2015   CLINICAL DATA:  Feeding tube placement.  Evaluate for ileus. EXAM: PORTABLE ABDOMEN - 2 VIEW COMPARISON:  09/14/2015 FINDINGS: Supine and right-sided decubitus views. Incompletely imaged ventricular assist device. Feeding tube terminates at the distal duodenum/duodenal jejunal junction. No significant bowel distention. Distal gas identified in the rectum. Right upper quadrant clips are likely related to cholecystectomy. No abnormal abdominal calcifications. No appendicolith. Right-sided decubitus view demonstrates no free intraperitoneal air. Minimal air-fluid levels are identified, at least partially of which are within the colon. IMPRESSION: No specific findings to suggest bowel obstruction or ileus. No bowel distension. There are a few nonspecific fluid levels on decubitus positioning. Electronically Signed   By: Abigail Miyamoto M.D.   On: 09/16/2015 11:27    I have reviewed the patient's current medications.  Assessment/Plan: 1 AKI  ???CKD.  Nonoliguric and slow rise of Cr indicating some function.   Will hold off HD, main issue is vol, will be cautious with that 2 LVAD infx Ancef/Rif, debride 3 Anemia drifting down, check Fe 4 CM with LVAD 5 ABDM ileus ?? Related to infx .  On po vanco P hold off HD, limit vol, check Fe, cont AB,  mobilize  LOS: 23 days   Angele Wiemann L 09/18/2015,7:24 AM

## 2015-09-19 ENCOUNTER — Encounter (HOSPITAL_COMMUNITY): Payer: Self-pay | Admitting: Cardiothoracic Surgery

## 2015-09-19 DIAGNOSIS — T827XXD Infection and inflammatory reaction due to other cardiac and vascular devices, implants and grafts, subsequent encounter: Secondary | ICD-10-CM

## 2015-09-19 DIAGNOSIS — Z95811 Presence of heart assist device: Secondary | ICD-10-CM

## 2015-09-19 DIAGNOSIS — I5023 Acute on chronic systolic (congestive) heart failure: Secondary | ICD-10-CM

## 2015-09-19 DIAGNOSIS — Y828 Other medical devices associated with adverse incidents: Secondary | ICD-10-CM

## 2015-09-19 LAB — COMPREHENSIVE METABOLIC PANEL
ALBUMIN: 1.6 g/dL — AB (ref 3.5–5.0)
ALK PHOS: 222 U/L — AB (ref 38–126)
ALT: 15 U/L — ABNORMAL LOW (ref 17–63)
ANION GAP: 11 (ref 5–15)
AST: 74 U/L — ABNORMAL HIGH (ref 15–41)
BUN: 51 mg/dL — ABNORMAL HIGH (ref 6–20)
CALCIUM: 8.7 mg/dL — AB (ref 8.9–10.3)
CO2: 25 mmol/L (ref 22–32)
Chloride: 102 mmol/L (ref 101–111)
Creatinine, Ser: 5.74 mg/dL — ABNORMAL HIGH (ref 0.61–1.24)
GFR calc non Af Amer: 11 mL/min — ABNORMAL LOW (ref >60)
GFR, EST AFRICAN AMERICAN: 12 mL/min — AB (ref >60)
GLUCOSE: 93 mg/dL (ref 65–99)
POTASSIUM: 3.4 mmol/L — AB (ref 3.5–5.1)
SODIUM: 138 mmol/L (ref 135–145)
TOTAL PROTEIN: 6.8 g/dL (ref 6.5–8.1)
Total Bilirubin: 5.2 mg/dL — ABNORMAL HIGH (ref 0.3–1.2)

## 2015-09-19 LAB — GLUCOSE, CAPILLARY
GLUCOSE-CAPILLARY: 102 mg/dL — AB (ref 65–99)
GLUCOSE-CAPILLARY: 99 mg/dL (ref 65–99)
GLUCOSE-CAPILLARY: 99 mg/dL (ref 65–99)
Glucose-Capillary: 90 mg/dL (ref 65–99)
Glucose-Capillary: 91 mg/dL (ref 65–99)
Glucose-Capillary: 116 mg/dL — ABNORMAL HIGH (ref 65–99)

## 2015-09-19 LAB — CBC
HCT: 27.9 % — ABNORMAL LOW (ref 39.0–52.0)
Hemoglobin: 9.1 g/dL — ABNORMAL LOW (ref 13.0–17.0)
MCH: 26.7 pg (ref 26.0–34.0)
MCHC: 32.6 g/dL (ref 30.0–36.0)
MCV: 81.8 fL (ref 78.0–100.0)
Platelets: 234 10*3/uL (ref 150–400)
RBC: 3.41 MIL/uL — ABNORMAL LOW (ref 4.22–5.81)
RDW: 20.3 % — AB (ref 11.5–15.5)
WBC: 16.8 10*3/uL — ABNORMAL HIGH (ref 4.0–10.5)

## 2015-09-19 LAB — TYPE AND SCREEN
ABO/RH(D): O POS
ANTIBODY SCREEN: NEGATIVE
UNIT DIVISION: 0

## 2015-09-19 LAB — PROTIME-INR
INR: 1.69 — AB (ref 0.00–1.49)
PROTHROMBIN TIME: 19.9 s — AB (ref 11.6–15.2)

## 2015-09-19 LAB — CARBOXYHEMOGLOBIN
CARBOXYHEMOGLOBIN: 2.4 % — AB (ref 0.5–1.5)
METHEMOGLOBIN: 1.2 % (ref 0.0–1.5)
O2 SAT: 76.1 %
TOTAL HEMOGLOBIN: 10 g/dL — AB (ref 13.5–18.0)

## 2015-09-19 LAB — LACTATE DEHYDROGENASE: LDH: 290 U/L — AB (ref 98–192)

## 2015-09-19 LAB — MAGNESIUM: Magnesium: 2.1 mg/dL (ref 1.7–2.4)

## 2015-09-19 LAB — PHOSPHORUS: Phosphorus: 5.1 mg/dL — ABNORMAL HIGH (ref 2.5–4.6)

## 2015-09-19 MED ORDER — WARFARIN SODIUM 2 MG PO TABS
4.0000 mg | ORAL_TABLET | Freq: Every day | ORAL | Status: DC
  Administered 2015-09-19 – 2015-09-21 (×3): 4 mg via ORAL
  Filled 2015-09-19 (×2): qty 2
  Filled 2015-09-19: qty 0.5

## 2015-09-19 MED ORDER — ALBUMIN HUMAN 25 % IV SOLN
12.5000 g | Freq: Four times a day (QID) | INTRAVENOUS | Status: AC
  Administered 2015-09-19 (×3): 12.5 g via INTRAVENOUS
  Filled 2015-09-19 (×3): qty 50

## 2015-09-19 MED ORDER — POTASSIUM CHLORIDE CRYS ER 20 MEQ PO TBCR
20.0000 meq | EXTENDED_RELEASE_TABLET | Freq: Once | ORAL | Status: AC
  Administered 2015-09-19: 20 meq via ORAL
  Filled 2015-09-19: qty 1

## 2015-09-19 MED ORDER — SODIUM CHLORIDE 0.9 % IV SOLN
510.0000 mg | Freq: Once | INTRAVENOUS | Status: AC
  Administered 2015-09-19: 510 mg via INTRAVENOUS
  Filled 2015-09-19 (×2): qty 17

## 2015-09-19 MED ORDER — POTASSIUM CHLORIDE 20 MEQ PO PACK: 20.0000 meq | PACK | Freq: Once | ORAL | Status: DC

## 2015-09-19 MED ORDER — HYDRALAZINE HCL 25 MG PO TABS
25.0000 mg | ORAL_TABLET | Freq: Three times a day (TID) | ORAL | Status: DC
  Administered 2015-09-19 – 2015-09-20 (×3): 25 mg via ORAL
  Filled 2015-09-19 (×3): qty 1

## 2015-09-19 NOTE — Progress Notes (Signed)
2 Days Post-Op Procedure(s) (LRB): ABDOMINAL WOUND CLOSURE WITH INCISIONAL VAC APPLICATION (N/A) ESOPHAGOGASTRODUODENOSCOPY (EGD) (N/A) Subjective: Overall improved No drainage from wound vac Less nausea, ambulated 300 ft Urine output improving Pump flow excellent w/o PI events INR 1.7- coumadin 4 mg Objective: Vital signs in last 24 hours: Temp:  [97.6 F (36.4 C)-99.1 F (37.3 C)] 99.1 F (37.3 C) (06/01 0347) Pulse Rate:  [84-98] 98 (06/01 0400) Cardiac Rhythm:  [-] Normal sinus rhythm (05/31 2000) Resp:  [13-30] 19 (06/01 0400) BP: (105-127)/(78-103) 124/88 mmHg (06/01 0400) SpO2:  [97 %-100 %] 99 % (06/01 0400) Weight:  [181 lb 10.5 oz (82.4 kg)] 181 lb 10.5 oz (82.4 kg) (06/01 0500)  Hemodynamic parameters for last 24 hours:  nsr afebrile  Intake/Output from previous day: 05/31 0701 - 06/01 0700 In: 1877.8 [I.V.:57.8; Blood:365; NG/GT:1205; IV Piggyback:250] Out: [id number]; Stool:400] Intake/Output this shift:         Exam    General- alert and comfortable   Lungs- clear without rales, wheezes   Cor- regular rate and rhythm, no murmur , gallop   Abdomen- distended but non tender- + flatus   Extremities - warm, non-tender, minimal edema   Neuro- oriented, appropriate, no focal weakness   Lab Results:  Recent Labs  09/18/15 2219 09/19/15 0440  WBC 16.1* 16.8*  HGB 8.4* 9.1*  HCT 25.8* 27.9*  PLT 227 234   BMET:  Recent Labs  09/18/15 0835 09/19/15 0440  NA 139 138  K 3.5 3.4*  CL 104 102  CO2 26 25  GLUCOSE 98 93  BUN 37* 51*  CREATININE 4.96* 5.74*  CALCIUM 8.8* 8.7*    PT/INR:  Recent Labs  09/19/15 0440  LABPROT 19.9*  INR 1.69*   ABG    Component Value Date/Time   PHART 7.332* 09/09/2015 1145   HCO3 20.2 09/09/2015 1145   TCO2 26 09/16/2015 2318   ACIDBASEDEF 5.0* 09/09/2015 1145   O2SAT 76.1 09/19/2015 0435   CBG (last 3)   Recent Labs  09/18/15 1954 09/19/15 0023 09/19/15 0344  GLUCAP 103* 99 99     Assessment/Plan: S/P Procedure(s) (LRB): ABDOMINAL WOUND CLOSURE WITH INCISIONAL VAC APPLICATION (N/A) ESOPHAGOGASTRODUODENOSCOPY (EGD) (N/A) Push nutrition Leave wound vac until mon tx to stepdown   LOS: 24 days    Tharon Aquas Trigt III 09/19/2015

## 2015-09-19 NOTE — Progress Notes (Signed)
Patient ambulated 300 ft with PT and then we took him outside, patient now back in bed, call bell within reach, will continue to monitor.  Rowe Pavy, RN

## 2015-09-19 NOTE — Progress Notes (Signed)
[facility] for Infectious Disease   Reason for visit: Follow up on C diff, drive line infection  Interval History: wound closure yesterday in OR.  No current n/v after switching rifampin to IV.  No fever, passing gas, abdominal bloating.  No current diarrhea.    Physical Exam: Constitutional:  Filed Vitals:   09/19/15 0900 09/19/15 1000  BP: 108/86 115/96  Pulse: 90 94  Temp:    Resp: 15 22   patient appears in NAD Eyes: anicteric HENT: no thrush Respiratory: Normal respiratory effort; CTA B Cardiovascular: LVAD  GI: soft, nt, distended, + hypoactive bowel sounds  Review of Systems: Constitutional: negative for fevers and chills Gastrointestinal: negative for diarrhea Hematologic/lymphatic: negative for lymphadenopathy  Lab Results  Component Value Date   WBC 16.8* 09/19/2015   HGB 9.1* 09/19/2015   HCT 27.9* 09/19/2015   MCV 81.8 09/19/2015   PLT 234 09/19/2015    Lab Results  Component Value Date   CREATININE 5.74* 09/19/2015   BUN 51* 09/19/2015   NA 138 09/19/2015   K 3.4* 09/19/2015   CL 102 09/19/2015   CO2 25 09/19/2015    Lab Results  Component Value Date   ALT 15* 09/19/2015   AST 74* 09/19/2015   ALKPHOS 222* 09/19/2015     Microbiology: Recent Results (from the past 240 hour(s))  C difficile quick scan w PCR reflex     Status: None   Collection Time: 09/15/15  9:25 AM  Result Value Ref Range Status   C Diff antigen NEGATIVE NEGATIVE Final   C Diff toxin NEGATIVE NEGATIVE Final   C Diff interpretation Negative for toxigenic C. difficile  Final  Urine culture     Status: Abnormal   Collection Time: 09/17/15 11:17 AM  Result Value Ref Range Status   Specimen Description URINE, CATHETERIZED  Final   Special Requests NONE  Final   Culture >=100,000 COLONIES/mL YEAST (A)  Final   Report Status 09/18/2015 FINAL  Final  Aerobic Culture (superficial specimen) (NOT AT [facility])     Status: None (Preliminary result)   Collection Time: 09/17/15   1:24 PM  Result Value Ref Range Status   Specimen Description WOUND ABDOMEN  Final   Special Requests PATIENT ON FOLLOWING  VANCOMYCIN  Final   Gram Stain   Final    ABUNDANT WBC PRESENT,BOTH PMN AND MONONUCLEAR NO ORGANISMS SEEN    Culture NO GROWTH < 24 HOURS  Final   Report Status PENDING  Incomplete  Culture, blood (routine x 2)     Status: None (Preliminary result)   Collection Time: 09/17/15  9:29 PM  Result Value Ref Range Status   Specimen Description BLOOD BLOOD LEFT HAND  Final   Special Requests BOTTLES DRAWN AEROBIC AND ANAEROBIC 5CC  Final   Culture NO GROWTH < 12 HOURS  Final   Report Status PENDING  Incomplete  Culture, blood (routine x 2)     Status: None (Preliminary result)   Collection Time: 09/17/15  9:36 PM  Result Value Ref Range Status   Specimen Description BLOOD BLOOD LEFT HAND  Final   Special Requests BOTTLES DRAWN AEROBIC ONLY 5CC  Final   Culture NO GROWTH < 12 HOURS  Final   Report Status PENDING  Incomplete    Impression/Plan:  1. mssa drive line infection - on cefazolin and IV rifampin and now tolerating rifampin well.  Pharmacy will look at cefazolin dose with current renal function. 2. C diff - on bid  oral vanco taper 3. Leukocytosis - WBC stable.  No new signs of infection.

## 2015-09-19 NOTE — Progress Notes (Signed)
Physical Therapy Treatment Patient Details Name: [name] MRN: [id number] DOB: 05-May-1970 Today's Date: 09/19/2015    History of Present Illness [name] is a 45 y.o. male with LVAD placed 4/09 complicated MSSA drive line /abdominal wall infection s/p multiple debridement, cdifficile infection on transfer, and aspiration pneumonia on 5/15 now on imipenem, rifampin plus oral vanco. TEE on 5/11 negative 5/22 DEBRIDEMENT AND CLOSURE WOUND WITH PLACEMENT OF ABRA CLOSURE DEVICE. Pt returned to OR for closure of abdomen and incisional VAC placed.    PT Comments    Pt making good progress. In better spirits. Went outside.  Follow Up Recommendations  CIR     Equipment Recommendations  None recommended by PT    Recommendations for Other Services       Precautions / Restrictions Precautions Precautions: Fall Restrictions Weight Bearing Restrictions: No    Mobility  Bed Mobility Overal bed mobility: Needs Assistance Bed Mobility: Supine to Sit;Sit to Supine     Supine to sit: Min assist Sit to supine: Min assist   General bed mobility comments: Assist to elevate trunk into sitting and to bring legs back up into bed  Transfers Overall transfer level: Needs assistance Equipment used: Rolling walker (2 wheeled) Transfers: Sit to/from Stand Sit to Stand: Min guard         General transfer comment: Verbal cues for hand placement. Assist for safety.  Ambulation/Gait Ambulation/Gait assistance: Min guard Ambulation Distance (Feet): 350 Feet Assistive device: Rolling walker (2 wheeled) Gait Pattern/deviations: Step-through pattern;Decreased stride length   Gait velocity interpretation: Below normal speed for age/gender General Gait Details: Assist for safety. Took one sitting rest break after exiting the room and then able to complete rest of the amb without a break.   Stairs            Wheelchair Mobility    Modified Rankin (Stroke Patients Only)        Balance Overall balance assessment: Needs assistance Sitting-balance support: No upper extremity supported;Feet supported Sitting balance-Leahy Scale: Good     Standing balance support: No upper extremity supported Standing balance-Leahy Scale: Fair                      Cognition Arousal/Alertness: Awake/alert Behavior During Therapy: WFL for tasks assessed/performed Overall Cognitive Status: Within Functional Limits for tasks assessed                      Exercises      General Comments General comments (skin integrity, edema, etc.): Pt needed assist with unscrewing cables to change over to batteries.      Pertinent Vitals/Pain Pain Assessment: Faces Faces Pain Scale: Hurts little more Pain Location: abdomen Pain Descriptors / Indicators: Sore Pain Intervention(s): Limited activity within patient's tolerance;Monitored during session;Repositioned (RN present and going to give meds)    Home Living                      Prior Function            PT Goals (current goals can now be found in the care plan section) Acute Rehab PT Goals PT Goal Formulation: With patient Time For Goal Achievement: 09/26/15 Potential to Achieve Goals: Good Progress towards PT goals: Progressing toward goals;Goals met and updated - see care plan    Frequency  Min 3X/week    PT Plan Current plan remains appropriate    Co-evaluation PT/OT/SLP Co-Evaluation/Treatment: Yes Reason for Co-Treatment:  For patient/therapist safety PT goals addressed during session: Mobility/safety with mobility       End of Session   Activity Tolerance: Patient tolerated treatment well Patient left: with call bell/phone within reach;with nursing/sitter in room;in bed     Time: 6394-3200 PT Time Calculation (min) (ACUTE ONLY): 70 min  Charges:  $Gait Training: 38-52 mins                    G Codes:      Savhanna Sliva 2015-10-10, 2:18 PM [facility] PT [phone]

## 2015-09-19 NOTE — Evaluation (Signed)
Clinical/Bedside Swallow Evaluation Patient Details  Name: [name] MRN: [id number] Date of Birth: 02-19-1971  Today's Date: 09/19/2015 Time: SLP Start Time (ACUTE ONLY): 66 SLP Stop Time (ACUTE ONLY): 1442 SLP Time Calculation (min) (ACUTE ONLY): 18 min  Past Medical History:  Past Medical History  Diagnosis Date  . Chronic systolic heart failure (HCC)     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%)  . Atrial fibrillation -parosysmal      Rx w amiodarone  . Noncompliance     H/O  MEDICAL NONCOMPLIANCE  . 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 Tricuspid valve regurgitation     SEVERE  . Severe mitral regurgitation   . Polymorphic ventricular tachycardia (HCC)     RECURRENT WITH APPROPRIATE SHOCK THERAPY IN THE PAST  . Ventricular fibrillation (HCC)     WITH APPROPRIATE SHOCK THERAPY IN THE PAST  . Hypertension   . Gout   . RA (rheumatoid arthritis) ([place])   . Automatic implantable cardiac defibrillator -BSX     single chamber  . GI bleed -massive     11 Units 2012  . Elevated LFTs   . H/O hyperthyroidism   . CHF (congestive heart failure) ([place])   . AKI (acute kidney injury) ([place])   . AICD (automatic cardioverter/defibrillator) present    Past Surgical History:  Past Surgical History  Procedure Laterality Date  . Cardiac catheterization  06/2006    RIGHT HEART CATH SHOWING SEVERE BIVENTRICUALR CHF WITH MARKED FILLING AND PRESSURES  . Insert / replace / remove pacemaker      GUIDANT HE ICD MODEL 2180, SERIAL # [id number]  . Cholecystectomy    . Implantable cardioverter defibrillator generator change N/A 07/01/2011    Procedure: IMPLANTABLE CARDIOVERTER DEFIBRILLATOR GENERATOR CHANGE;  Surgeon: Deboraha Sprang, MD;  [facility] CATH LAB;  Service: Cardiovascular;  Laterality: N/A;  . Tee without cardioversion N/A 12/12/2014    Procedure: TRANSESOPHAGEAL ECHOCARDIOGRAM (TEE);  Surgeon: Jerline Pain, MD;  Location: [place];  Service: Cardiovascular;  Laterality: N/A;  . Cardiac catheterization N/A 12/14/2014    Procedure: Right Heart Cath;  Surgeon: Jolaine Artist, MD;  Location: [place] CV LAB;  Service: Cardiovascular;  Laterality: N/A;  . Cardiac catheterization N/A 12/14/2014    Procedure: IABP Insertion;  Surgeon: Jolaine Artist, MD;  Location: [place] CV LAB;  Service: Cardiovascular;  Laterality: N/A;  . Insertion of implantable left ventricular assist device N/A 12/20/2014    Procedure: INSERTION OF IMPLANTABLE LEFT VENTRICULAR ASSIST DEVICE;  Surgeon: Ivin Poot, MD;  Location: [place];  Service: Open Heart Surgery;  Laterality: N/A;  CIRC ARREST  NITRIC OXIDE  . Tee without cardioversion N/A 12/20/2014    Procedure: TRANSESOPHAGEAL ECHOCARDIOGRAM (TEE);  Surgeon: Ivin Poot, MD;  Location: [place];  Service: Open Heart Surgery;  Laterality: N/A;  . Sternal wound debridement N/A 08/27/2015    Procedure: WOUND IRRIGATION AND DEBRIDEMENT;  Surgeon: Ivin Poot, MD;  Location: [place];  Service: Thoracic;  Laterality: N/A;  . Application of wound vac N/A 08/27/2015    Procedure: APPLICATION OF WOUND VAC;  Surgeon: Ivin Poot, MD;  Location: [place];  Service: Thoracic;  Laterality: N/A;  . Sternal wound debridement N/A 08/29/2015    Procedure: DEBRIDEMENT OF chest wound;  Surgeon: Ivin Poot, MD;  Location: [place];  Service: Thoracic;  Laterality: N/A;  . Application of wound vac N/A 08/29/2015  Procedure: WOUND VAC CHANGE;  Surgeon: Ivin Poot, MD;  Location: [place];  Service: Thoracic;  Laterality: N/A;  . Incision and drainage of wound N/A 09/05/2015    Procedure: IRRIGATION AND DEBRIDEMENT sternal WOUND;  Surgeon: Loel Lofty Dillingham, DO;  Location: [place];  Service: Plastics;  Laterality: N/A;  . Application of a-cell of extremity N/A 09/05/2015    Procedure: APPLICATION OF A-CELL OF sternum;  Surgeon: Loel Lofty Dillingham, DO;  Location: [place];  Service: Plastics;   Laterality: N/A;  . Application of wound vac N/A 09/05/2015    Procedure: APPLICATION OF WOUND VAC to sternum;  Surgeon: Loel Lofty Dillingham, DO;  Location: [place];  Service: Plastics;  Laterality: N/A;  . Insertion of dialysis catheter Right 09/05/2015    Procedure: INSERTION OF DIALYSIS CATHETER;RIGHT SUBCLAVIAN;  Surgeon: Loel Lofty Dillingham, DO;  Location: [place];  Service: Plastics;  Laterality: Right;  . Sternal closure N/A 09/17/2015    Procedure: ABDOMINAL WOUND CLOSURE WITH INCISIONAL VAC APPLICATION;  Surgeon: Ivin Poot, MD;  Location: [place];  Service: Thoracic;  Laterality: N/A;  . Esophagogastroduodenoscopy N/A 09/17/2015    Procedure: ESOPHAGOGASTRODUODENOSCOPY (EGD);  Surgeon: Carol Ada, MD;  Location: [place];  Service: Gastroenterology;  Laterality: N/A;  . Pectoralis flap N/A 09/09/2015    Procedure: DEBRIDEMENT AND CLOSURE WOUND WITH PLACEMENT OF ABRA CLOSURE DEVICE;  Surgeon: Loel Lofty Dillingham, DO;  Location: [place];  Service: Plastics;  Laterality: N/A;   HPI:  45 y.o. 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 TEE on 5/11 negative 5/22 DEBRIDEMENT AND CLOSURE WOUND WITH PLACEMENT OF ABRA CLOSURE DEVICE. Pt returned to OR for closure of abdomen and incisional VAC placed.   Assessment / Plan / Recommendation Clinical Impression  Pt consumed PO trials with seemingly normal oropharyngeal function, with one throat clear observed across all intake, including solids, liquids, and mixed consistencies. Will start clear liquid diet given recent N/V, but would advance to regular textures as tolerated. SLP to sign off at this time.    Aspiration Risk  Mild aspiration risk    Diet Recommendation Regular;Thin liquid (will start CLD for now)   Liquid Administration via: Cup;Straw Medication Administration: Whole  meds with liquid Supervision: Patient able to self feed;Intermittent supervision to cue for compensatory strategies Compensations: Slow rate;Small sips/bites Postural Changes: Seated upright at 90 degrees    Other  Recommendations Oral Care Recommendations: Oral care BID   Follow up Recommendations  None    Frequency and Duration            Prognosis Prognosis for Safe Diet Advancement: Good      Swallow Study   General HPI: 45 y.o. 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 TEE on 5/11 negative 5/22 DEBRIDEMENT AND CLOSURE WOUND WITH PLACEMENT OF ABRA CLOSURE DEVICE. Pt returned to OR for closure of abdomen and incisional VAC placed. Type of Study: Bedside Swallow Evaluation Previous Swallow Assessment: none in chart Diet Prior to this Study: NPO Temperature Spikes Noted: No Respiratory Status: Room air History of Recent Intubation:  (intuabted for procedures only) Behavior/Cognition: Alert;Cooperative;Pleasant mood Oral Care Completed by SLP: No Oral Cavity - Dentition: Adequate natural dentition Vision: Functional for self-feeding Self-Feeding Abilities: Able  to feed self Patient Positioning: Upright in bed Baseline Vocal Quality: Normal    Oral/Motor/Sensory Function Overall Oral Motor/Sensory Function: Within functional limits   Ice Chips Ice chips: Within functional limits Presentation: Spoon;Self Fed   Thin Liquid Thin Liquid: Impaired Presentation: Self Fed;Spoon;Straw Pharyngeal  Phase Impairments: Throat Clearing - Immediate (x1)    Nectar Thick Nectar Thick Liquid: Not tested   Honey Thick Honey Thick Liquid: Not tested   Puree Puree: Within functional limits Presentation: Self Fed;Spoon   Solid   GO   Solid: Within functional limits Presentation: Self Fed       Germain Osgood,  M.A. CCC-SLP [phone]  Germain Osgood 09/19/2015,2:49 PM

## 2015-09-19 NOTE — Progress Notes (Signed)
Occupational Therapy Treatment Patient Details Name: [name] MRN: [id number] DOB: 1970-08-25 Today's Date: 09/19/2015    History of present illness MOICES MORRICE is a 45 y.o. male with LVAD placed [id number] complicated MSSA drive line /abdominal wall infection s/p multiple debridement, cdifficile infection on transfer, and aspiration pneumonia on 5/15. TEE on 5/11 negative 5/22 DEBRIDEMENT AND CLOSURE WOUND WITH PLACEMENT OF ABRA CLOSURE DEVICE. Pt returned to OR for closure of abdomen and incisional VAC placed.   OT comments  Pt initially despondent, but affect became brighter as session continued. Ambulating with min guard assist pushing w/c with second person for safety and equipment. Only requiring 1 sitting rest break during full lap around unit. Limited ability to participate fully in UB ADL due to multiple lines.  Follow Up Recommendations  CIR;Supervision/Assistance - 24 hour    Equipment Recommendations  None recommended by OT    Recommendations for Other Services      Precautions / Restrictions Precautions Precautions: Fall Restrictions Weight Bearing Restrictions: No       Mobility Bed Mobility    General bed mobility comments: OT arrived with pt seated at EOB with PT  Transfers Overall transfer level: Needs assistance Equipment used: Pushed w/c Transfers: Sit to/from Stand Sit to Stand: Min guard;+2 safety/equipment         General transfer comment: Verbal cues for hand placement. Assist for safety.    Balance Overall balance assessment: Needs assistance Sitting-balance support: No upper extremity supported;Feet supported Sitting balance-Leahy Scale: Good     Standing balance support: No upper extremity supported Standing balance-Leahy Scale: Fair                     ADL                   Upper Body Dressing : Moderate assistance;Sitting Upper Body Dressing Details (indicate cue type and reason): vest and front opening gown    Lower Body Dressing Details (indicate cue type and reason): continues to be able to don socks despite abdominal pain             Functional mobility during ADLs: +2 for safety/equipment;Minimal assistance (pushing w/c, chair following) General ADL Comments: Pt immediately willing to get OOB with therapists, requesting to go outside. Much brighter affect today.      Vision                     Perception     Praxis      Cognition   Behavior During Therapy: WFL for tasks assessed/performed Overall Cognitive Status: Within Functional Limits for tasks assessed                       Extremity/Trunk Assessment               Exercises     Shoulder Instructions       General Comments      Pertinent Vitals/ Pain       Pain Assessment: Faces Faces Pain Scale: Hurts little more Pain Location: abdomen Pain Descriptors / Indicators: Sore Pain Intervention(s): Monitored during session;Premedicated before session  Home Living                                          Prior Functioning/Environment  Frequency Min 2X/week     Progress Toward Goals  OT Goals(current goals can now be found in the care plan section)  Progress towards OT goals: Progressing toward goals  Acute Rehab OT Goals Patient Stated Goal: to get better Time For Goal Achievement: 09/27/15 Potential to Achieve Goals: Good  Plan Discharge plan remains appropriate    Co-evaluation    PT/OT/SLP Co-Evaluation/Treatment: Yes Reason for Co-Treatment: Complexity of the patient's impairments (multi-system involvement);For patient/therapist safety PT goals addressed during session: Mobility/safety with mobility OT goals addressed during session: ADL's and self-care      End of Session     Activity Tolerance Patient tolerated treatment well   Patient Left  (in w/c going outside with PT and RN)   Nurse Communication          [id number] OT Time Calculation (min): 21 min  Charges: OT General Charges $OT Visit: 1 Procedure OT Treatments $Therapeutic Activity: 8-22 mins  Malka So 09/19/2015, 2:45 PM  [phone]

## 2015-09-19 NOTE — Progress Notes (Signed)
Patient ID: [name], male   DOB: January 29, 1971, 45 y.o.   MRN: [id number]    HeartMate 2 Rounding Note  Subjective:    Admitted from [facility] in [place] with driveline abscess. Blood cultures --> MSSA and has C diff. Remains on Nafcillin  and oral vancomycin.   08/27/15 S/P I &D Epigastric with hardware exposed and VAC placement.  5/11 repeat I&D and wound vac replacement.  5/11 TEE no vegetations on valves or ICD wire 5/22 S/P ABRA and Acell  5/30 EGD- normal. Wound Closure.   Yesterdeay he received 1UPRBCs. Hgb up rom 7.7>9.1 Making 30-40 cc urine. Ambulated 300 feet. No nausea today.    VAD INTERROGATION:  HeartMate II LVAD:  Flow 5.1  liters/min, speed 9200, power 5.5  PI 7.4 no PI events.     Objective:    Vital Signs:   Temp:  [97.6 F (36.4 C)-99.1 F (37.3 C)] 99.1 F (37.3 C) (06/01 0347) Pulse Rate:  [84-98] 98 (06/01 0400) Resp:  [13-30] 19 (06/01 0400) BP: (105-127)/(78-103) 124/88 mmHg (06/01 0400) SpO2:  [97 %-100 %] 99 % (06/01 0400) Weight:  [181 lb 10.5 oz (82.4 kg)] 181 lb 10.5 oz (82.4 kg) (06/01 0500) Last BM Date: 09/17/15 Mean arterial Pressure  92-107.   Intake/Output:   Intake/Output Summary (Last 24 hours) at 09/19/15 0720 Last data filed at 09/19/15 0600  Gross per 24 hour  Intake 1877.83 ml  Output   1790 ml  Net  87.83 ml     Physical Exam: General: In bed. NAD HEENT: Scleral icterus +panda Neck: supple. Carotids 2+ bilat; no bruits. No lymphadenopathy or thryomegaly appreciated. R subclavian trialysis cath dressing dry Cor: Mechanical heart sounds with LVAD hum present.Irregular.  Lungs: clear on  Abdomen: soft, tender,  + distended. + bowel sounds. Incisional Vac in place.    Driveline: C/D/I; securement device intact and driveline incorporated Extremities: no cyanosis, clubbing, rash, trace edema  Neuro: A & O x3  Telemetry:  NSR 90s.      Labs: Basic Metabolic Panel:  Recent Labs Lab 09/14/15 0906  09/15/15 0414 09/16/15 0409  09/16/15 2330 09/17/15 0501 09/18/15 0438 09/18/15 0835 09/19/15 0440  NA  --  139 138  < > 136 139 138 139 138  K  --  3.6 3.7  < > 3.5 3.8 3.7 3.5 3.4*  CL  --  104 106  < > 102 103 103 104 102  CO2  --  25 22  --  27 28 25 26 25   GLUCOSE  --  110* 105*  < > 99 76 83 98 93  BUN  --  40* 51*  < > 18 20 35* 37* 51*  CREATININE  --  5.11* 6.23*  < > 2.97* 3.41* 4.79* 4.96* 5.74*  CALCIUM  --  8.4* 8.6*  --  7.9* 8.2* 8.7* 8.8* 8.7*  MG 2.2  --   --   --  2.0  --   --   --  2.1  PHOS  --  3.4 3.9  --   --   --  4.8*  --  5.1*  < > = values in this interval not displayed.  Liver Function Tests:  Recent Labs Lab 09/14/15 0515  09/15/15 1347 09/16/15 0409 09/17/15 0501 09/18/15 0438 09/18/15 0835 09/19/15 0440  AST 74*  --  71*  --  66*  --  69* 74*  ALT 30  --  22  --  16*  --  17 15*  ALKPHOS 161*  --  178*  --  153*  --  183* 222*  BILITOT 7.5*  --  7.1*  --  6.4*  --  5.7* 5.2*  PROT 6.0*  --  6.0*  --  6.0*  --  6.9 6.8  ALBUMIN 1.5*  < > 1.6* 1.6* 1.6* 1.6* 1.6* 1.6*  < > = values in this interval not displayed. No results for input(s): LIPASE, AMYLASE in the last 168 hours. No results for input(s): AMMONIA in the last 168 hours.  CBC:  Recent Labs Lab 09/14/15 0515  09/17/15 0501 09/17/15 1132 09/18/15 0438 09/18/15 1330 09/18/15 2219 09/19/15 0440  WBC 16.2*  < > 20.0*  --  17.5* 17.8* 16.1* 16.8*  NEUTROABS 13.7*  --   --  17.3*  --   --   --   --   HGB 7.8*  < > 8.3*  --  7.9* 7.7* 8.4* 9.1*  HCT 23.4*  < > 25.5*  --  24.1* 23.9* 25.8* 27.9*  MCV 81.5  < > 83.6  --  82.8 84.2 83.0 81.8  PLT 164  < > 220  --  230 218 227 234  < > = values in this interval not displayed.  INR:  Recent Labs Lab 09/17/15 0501 09/18/15 0438 09/19/15 0440  INR 1.54* 1.62* 1.69*    Other results:    Imaging: Dg Chest Port 1 View  09/18/2015  CLINICAL DATA:  Shortness of breath. EXAM: PORTABLE CHEST 1 VIEW COMPARISON:  09/15/2015.  FINDINGS: Feeding tube and right subclavian line in stable position. Cardiac pacer in stable position. Prior CABG. Left ventricular assist device in stable position. Left atrial appendage clip in stable position. Stable cardiomegaly. Low lung volumes with mild bibasilar atelectasis. No pleural effusion or pneumothorax. IMPRESSION: 1. Lines and tubes in stable position. Cardiac pacer left ventricular assist device in stable position. Prior CABG. Left atrial appendage clip in stable position. 2. Low lung volumes with mild bibasilar atelectasis. Electronically Signed   By: Marcello Moores  Register   On: 09/18/2015 08:23   Dg Abd Portable 1v  09/17/2015  CLINICAL DATA:  Nasogastric tube placement. EXAM: PORTABLE ABDOMEN - 1 VIEW COMPARISON:  None. FINDINGS: The bowel gas pattern is normal. Distal tip of nasogastric tube appears to be in the expected position of second portion of the duodenum. IMPRESSION: Distal tip of nasogastric tube appears to be in expected position of second portion of the duodenum. Electronically Signed   By: Marijo Conception, M.D.   On: 09/17/2015 16:10     Medications:     Scheduled Medications: . sodium chloride   Intravenous Once  . allopurinol  100 mg Oral Daily  . alteplase  4 mg Intracatheter Once  .  ceFAZolin (ANCEF) IV  1 g Intravenous Q24H  . citalopram  20 mg Oral Daily  . docusate sodium  200 mg Oral Daily  . feeding supplement (PRO-STAT SUGAR FREE 64)  30 mL Per Tube TID  . ferumoxytol  510 mg Intravenous Once  . hydrALAZINE  10 mg Oral Q8H  . magic mouthwash  10 mL Oral TID  . metoCLOPramide (REGLAN) injection  5 mg Intravenous Q6H  . pantoprazole  40 mg Oral QHS  . potassium chloride  20 mEq Oral Once  . pregabalin  75 mg Oral Daily  . rifampin (RIFADIN) IVPB  300 mg Intravenous Q12H  . sildenafil  20 mg Oral Q8H  . simethicone  80 mg Oral  QID  . sodium chloride flush  10-40 mL Intracatheter Q12H  . tamsulosin  0.4 mg Oral Daily  . vancomycin  125 mg Oral Q12H    . warfarin  2.5 mg Oral q1800  . Warfarin - Physician Dosing Inpatient   Does not apply q1800    Infusions: . sodium chloride Stopped (09/17/15 1530)  . feeding supplement (NEPRO CARB STEADY) 1,000 mL (09/19/15 0600)    PRN Medications: sodium chloride, sodium chloride, bisacodyl **OR** bisacodyl, fentaNYL (SUBLIMAZE) injection, fentaNYL (SUBLIMAZE) injection, gelatin adsorbable, Gerhardt's butt cream, hydrALAZINE, ondansetron, ondansetron (ZOFRAN) IV, oxyCODONE, prochlorperazine, sodium chloride flush, traMADol   Assessment:   1. LVAD Complication- Driveline abscess 2. MSSA bacteremia -> septic shock 3. Acute/Chronic systolic HF s/p VAD placement 9/16 4. C. Difficile colitis 5. 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 Acute blood loss anemia 8. Severe Malnutrition.   9.. ARF 10. Klebsiella (ESBL) pneumonia 11. NSVT  12. Hypokalemia  13. RV Failure  14. Malnutrition   Plan/Discussion:    S/P I&D LVAD driveline abscess. Exposed hardware. 09/09/15 S/P ABRA and reapplication of ACell. ABR removed 5/30 and wound closed by Dr Darcey Nora. On Coumadin. INR 1.69   WBC 16.8.   Remains on cefazolin + rifampin for MSSA bacteremia and oral vanc for c.diff. Klebseilla PNA has been treated. Off po rifampin due to poor po intake. Continue IV rifampin. Question whether or not oral rifampin is contributing to nausea.  Blood cultures.  NGTD. Urine cultures pending. For now hold off on fungal coverage. Dr Baxter Flattery following closely  Stable co-ox 76%.   Tolerating TFs. No nausea today.  EGD normal. Hgb 9.1. Continue IV Protonix. Albumin 1.6.  Consult speech therapy.     Has RV failure. MAPs 92-107. Continue 20 mg sildenafil.  Increase hydralazine to 25 mg tid.      Continue with scleral icterus. Total bilirubin coming down. LDH ok so doubt hemolysis. Amiodarone on hold. Can consider RUQ u/s.   Renal following. Making 30-40 cc urine. Holding HD today.   VAD parameters  ok. Hgb 9.1 After transfusion. LDH ok. .   PT following. Recommending CIR. CIR following.   NSR today.  Off amio for now with elevated LFTs.    Transfer to [employer]   Length of Stay: 24  Amy Clegg NP-C  09/19/2015, 7:20 AM  VAD Team --- VAD ISSUES ONLY--- Pager [phone] (7am - 7am)  Advanced Heart Failure Team  Pager [phone] (M-F; 7a - 4p)  Please contact [facility] for night-coverage after hours (4p -7a ) and weekends on [email or web address]  Patient seen and examined with Darrick Grinder, NP. We discussed all aspects of the encounter. I agree with the assessment and plan as stated above.   Continues to make slow progress. Abdominal wound now closed. WBC coming down on current abx (switch to IV rifampin). Hgb stable. EGD ok. Continue low-dose heparin. On coumadin. INR 1.7. Making some urine but still requiring HD. Hopeful that he will continue to have renal recovery. Volume status ok. VAD parameters stable. Continue to ambulate. Tolerating TFs well. VAD parameters stable.  Shoshannah Faubert,MD 8:52 AM

## 2015-09-19 NOTE — Progress Notes (Signed)
Subjective: Interval History: has complaints, stom tight but better.  Objective: Vital signs in last 24 hours: Temp:  [97.6 F (36.4 C)-99.1 F (37.3 C)] 99.1 F (37.3 C) (06/01 0347) Pulse Rate:  [84-98] 98 (06/01 0400) Resp:  [13-30] 19 (06/01 0400) BP: (105-127)/(78-103) 124/88 mmHg (06/01 0400) SpO2:  [97 %-100 %] 99 % (06/01 0400) Weight:  [82.4 kg (181 lb 10.5 oz)] 82.4 kg (181 lb 10.5 oz) (06/01 0500) Weight change: -0.2 kg (-7.1 oz)  Intake/Output from previous day: 05/31 0701 - 06/01 0700 In: 1877.8 [I.V.:57.8; Blood:365; NG/GT:1205; IV Piggyback:250] Out: [id number] [Urine:1390; Stool:400] Intake/Output this shift:    General appearance: alert, cooperative and no distress Resp: diminished breath sounds bilaterally and rales bibasilar Chest wall: R [place] cath Cardio: S1, S2 normal, systolic murmur: holosystolic 2/6, blowing at apex and cont hum of LVAD GI: mod distension, pos bs Extremities: extremities normal, atraumatic, no cyanosis or edema  Lab Results:  Recent Labs  09/18/15 2219 09/19/15 0440  WBC 16.1* 16.8*  HGB 8.4* 9.1*  HCT 25.8* 27.9*  PLT 227 234   BMET:  Recent Labs  09/18/15 0835 09/19/15 0440  NA 139 138  K 3.5 3.4*  CL 104 102  CO2 26 25  GLUCOSE 98 93  BUN 37* 51*  CREATININE 4.96* 5.74*  CALCIUM 8.8* 8.7*   No results for input(s): PTH in the last 72 hours. Iron Studies:  Recent Labs  09/18/15 0750  IRON 34*  TIBC 164*    Studies/Results: Dg Chest Port 1 View  09/18/2015  CLINICAL DATA:  Shortness of breath. EXAM: PORTABLE CHEST 1 VIEW COMPARISON:  09/15/2015. FINDINGS: Feeding tube and right subclavian line in stable position. Cardiac pacer in stable position. Prior CABG. Left ventricular assist device in stable position. Left atrial appendage clip in stable position. Stable cardiomegaly. Low lung volumes with mild bibasilar atelectasis. No pleural effusion or pneumothorax. IMPRESSION: 1. Lines and tubes in stable position. Cardiac  pacer left ventricular assist device in stable position. Prior CABG. Left atrial appendage clip in stable position. 2. Low lung volumes with mild bibasilar atelectasis. Electronically Signed   By: Marcello Moores  Register   On: 09/18/2015 08:23   Dg Abd Portable 1v  09/17/2015  CLINICAL DATA:  Nasogastric tube placement. EXAM: PORTABLE ABDOMEN - 1 VIEW COMPARISON:  None. FINDINGS: The bowel gas pattern is normal. Distal tip of nasogastric tube appears to be in the expected position of second portion of the duodenum. IMPRESSION: Distal tip of nasogastric tube appears to be in expected position of second portion of the duodenum. Electronically Signed   By: Marijo Conception, M.D.   On: 09/17/2015 16:10    I have reviewed the patient's current medications.  Assessment/Plan: 1 AKI Nonoliguric, I=O.  Acid base ok.  K mildly low, replete.  Not uremic 2 LVAD 3 Infx of line to LVAD on Ancef,Rif, debrided 4 Anemia give Fe 5 Abn LFTs follow 6 Ileus still issue P Fe, hold off HD today, follow chem, vol, cont AB,   LOS: 24 days   Rodnesha Elie L 09/19/2015,7:21 AM

## 2015-09-20 ENCOUNTER — Inpatient Hospital Stay (HOSPITAL_COMMUNITY): Payer: 59

## 2015-09-20 DIAGNOSIS — I5023 Acute on chronic systolic (congestive) heart failure: Secondary | ICD-10-CM

## 2015-09-20 DIAGNOSIS — Z95811 Presence of heart assist device: Secondary | ICD-10-CM

## 2015-09-20 LAB — GLUCOSE, CAPILLARY
GLUCOSE-CAPILLARY: 106 mg/dL — AB (ref 65–99)
GLUCOSE-CAPILLARY: 113 mg/dL — AB (ref 65–99)
GLUCOSE-CAPILLARY: 96 mg/dL (ref 65–99)
Glucose-Capillary: 112 mg/dL — ABNORMAL HIGH (ref 65–99)
Glucose-Capillary: 104 mg/dL — ABNORMAL HIGH (ref 65–99)
Glucose-Capillary: 93 mg/dL (ref 65–99)

## 2015-09-20 LAB — CARBOXYHEMOGLOBIN
CARBOXYHEMOGLOBIN: 2.3 % — AB (ref 0.5–1.5)
METHEMOGLOBIN: 1.4 % (ref 0.0–1.5)
O2 SAT: 62.5 %
TOTAL HEMOGLOBIN: 8.8 g/dL — AB (ref 13.5–18.0)

## 2015-09-20 LAB — COMPREHENSIVE METABOLIC PANEL
ALT: 12 U/L — ABNORMAL LOW (ref 17–63)
ANION GAP: 11 (ref 5–15)
AST: 66 U/L — ABNORMAL HIGH (ref 15–41)
Albumin: 1.8 g/dL — ABNORMAL LOW (ref 3.5–5.0)
Alkaline Phosphatase: 203 U/L — ABNORMAL HIGH (ref 38–126)
BUN: 62 mg/dL — ABNORMAL HIGH (ref 6–20)
CALCIUM: 8.9 mg/dL (ref 8.9–10.3)
CHLORIDE: 104 mmol/L (ref 101–111)
CO2: 25 mmol/L (ref 22–32)
Creatinine, Ser: 6.72 mg/dL — ABNORMAL HIGH (ref 0.61–1.24)
GFR, EST AFRICAN AMERICAN: 10 mL/min — AB (ref >60)
GFR, EST NON AFRICAN AMERICAN: 9 mL/min — AB (ref >60)
Glucose, Bld: 100 mg/dL — ABNORMAL HIGH (ref 65–99)
Potassium: 3 mmol/L — ABNORMAL LOW (ref 3.5–5.1)
SODIUM: 140 mmol/L (ref 135–145)
Total Bilirubin: 5 mg/dL — ABNORMAL HIGH (ref 0.3–1.2)
Total Protein: 6.4 g/dL — ABNORMAL LOW (ref 6.5–8.1)

## 2015-09-20 LAB — LACTATE DEHYDROGENASE: LDH: 234 U/L — ABNORMAL HIGH (ref 98–192)

## 2015-09-20 LAB — CBC
HCT: 24.8 % — ABNORMAL LOW (ref 39.0–52.0)
Hemoglobin: 8.1 g/dL — ABNORMAL LOW (ref 13.0–17.0)
MCH: 27.5 pg (ref 26.0–34.0)
MCHC: 32.7 g/dL (ref 30.0–36.0)
MCV: 84.1 fL (ref 78.0–100.0)
PLATELETS: 210 10*3/uL (ref 150–400)
RBC: 2.95 MIL/uL — ABNORMAL LOW (ref 4.22–5.81)
RDW: 20.1 % — ABNORMAL HIGH (ref 11.5–15.5)
WBC: 13.2 10*3/uL — ABNORMAL HIGH (ref 4.0–10.5)

## 2015-09-20 LAB — AEROBIC CULTURE W GRAM STAIN (SUPERFICIAL SPECIMEN): Culture: NO GROWTH

## 2015-09-20 LAB — PROTIME-INR
INR: 1.65 — ABNORMAL HIGH (ref 0.00–1.49)
PROTHROMBIN TIME: 19.5 s — AB (ref 11.6–15.2)

## 2015-09-20 MED ORDER — HEPARIN SODIUM (PORCINE) 1000 UNIT/ML DIALYSIS: 1000.0000 [IU] | INTRAMUSCULAR | Status: DC | PRN

## 2015-09-20 MED ORDER — SODIUM CHLORIDE 0.9 % IV SOLN: 100.0000 mL | INTRAVENOUS | Status: DC | PRN

## 2015-09-20 MED ORDER — ALTEPLASE 2 MG IJ SOLR: 2.0000 mg | Freq: Once | INTRAMUSCULAR | Status: DC | PRN

## 2015-09-20 MED ORDER — LIDOCAINE-PRILOCAINE 2.5-2.5 % EX CREA
1.0000 | TOPICAL_CREAM | CUTANEOUS | Status: DC | PRN
Start: 2015-09-20 — End: 2015-09-25
  Filled 2015-09-20: qty 5

## 2015-09-20 MED ORDER — ACETAMINOPHEN 160 MG/5ML PO SOLN
500.0000 mg | Freq: Four times a day (QID) | ORAL | Status: DC | PRN
  Administered 2015-09-20 – 2015-09-22 (×3): 500 mg via ORAL
  Filled 2015-09-20 (×3): qty 20.3

## 2015-09-20 MED ORDER — LIDOCAINE HCL (PF) 1 % IJ SOLN: 5.0000 mL | INTRAMUSCULAR | Status: DC | PRN

## 2015-09-20 MED ORDER — POTASSIUM CHLORIDE 20 MEQ/15ML (10%) PO SOLN
40.0000 meq | Freq: Two times a day (BID) | ORAL | Status: DC
  Administered 2015-09-20: 40 meq via ORAL
  Filled 2015-09-20 (×2): qty 30

## 2015-09-20 MED ORDER — HEPARIN SODIUM (PORCINE) 1000 UNIT/ML DIALYSIS
100.0000 [IU]/kg | INTRAMUSCULAR | Status: DC | PRN
  Filled 2015-09-20: qty 9

## 2015-09-20 MED ORDER — PENTAFLUOROPROP-TETRAFLUOROETH EX AERO: 1.0000 "application " | INHALATION_SPRAY | CUTANEOUS | Status: DC | PRN

## 2015-09-20 MED ORDER — POTASSIUM CHLORIDE CRYS ER 20 MEQ PO TBCR
40.0000 meq | EXTENDED_RELEASE_TABLET | Freq: Two times a day (BID) | ORAL | Status: DC
  Administered 2015-09-20: 40 meq via ORAL
  Filled 2015-09-20 (×2): qty 2

## 2015-09-20 NOTE — Progress Notes (Signed)
Patient ID: [name], male   DOB: 1970/10/20, 45 y.o.   MRN: [id number]    HeartMate 2 Rounding Note  Subjective:    Admitted from [facility] in [place] with driveline abscess. Blood cultures --> MSSA and has C diff. Remains on Nafcillin  and oral vancomycin.   08/27/15 S/P I &D Epigastric with hardware exposed and VAC placement.  5/11 repeat I&D and wound vac replacement.  5/11 TEE no vegetations on valves or ICD wire 5/22 S/P ABRA and Acell  5/30 EGD- normal. Wound Closure.   Continues to improve. Ambulating 300 feet. No nausea. Complaining of headache.    VAD INTERROGATION:  HeartMate II LVAD:  Flow 5.5  liters/min, speed 9200, power 5.7  PI 7.6 no PI events.     Objective:    Vital Signs:   Temp:  [98.5 F (36.9 C)-99.8 F (37.7 C)] 99 F (37.2 C) (06/02 0400) Pulse Rate:  [86-169] 91 (06/02 0625) Resp:  [15-28] 22 (06/02 0625) BP: (100-128)/(76-96) 114/86 mmHg (06/02 0500) SpO2:  [95 %-98 %] 98 % (06/02 0625) Weight:  [182 lb 1.6 oz (82.6 kg)] 182 lb 1.6 oz (82.6 kg) (06/02 0500) Last BM Date: 09/18/15 Mean arterial Pressure  90s    Intake/Output:   Intake/Output Summary (Last 24 hours) at 09/20/15 0732 Last data filed at 09/20/15 0600  Gross per 24 hour  Intake   2172 ml  Output   1640 ml  Net    532 ml     Physical Exam: General: In bed. NAD HEENT: Scleral icterus +panda Neck: supple. Carotids 2+ bilat; no bruits. No lymphadenopathy or thryomegaly appreciated. R subclavian trialysis cath dressing dry Cor: Mechanical heart sounds with LVAD hum present.Irregular.  Lungs: clear on  Abdomen: soft, tender,  + distended. + bowel sounds. Incisional Vac in place.    Driveline: C/D/I; securement device intact and driveline incorporated Extremities: no cyanosis, clubbing, rash, trace edema  Neuro: A & O x3  Telemetry:  NSR 90s.      Labs: Basic Metabolic Panel:  Recent Labs Lab 09/14/15 0906 09/15/15 0414 09/16/15 0409   09/16/15 2330 09/17/15 0501 09/18/15 0438 09/18/15 0835 09/19/15 0440 09/20/15 0351  NA  --  139 138  < > 136 139 138 139 138 140  K  --  3.6 3.7  < > 3.5 3.8 3.7 3.5 3.4* 3.0*  CL  --  104 106  < > 102 103 103 104 102 104  CO2  --  25 22  --  27 28 25 26 25 25   GLUCOSE  --  110* 105*  < > 99 76 83 98 93 100*  BUN  --  40* 51*  < > 18 20 35* 37* 51* 62*  CREATININE  --  5.11* 6.23*  < > 2.97* 3.41* 4.79* 4.96* 5.74* 6.72*  CALCIUM  --  8.4* 8.6*  --  7.9* 8.2* 8.7* 8.8* 8.7* 8.9  MG 2.2  --   --   --  2.0  --   --   --  2.1  --   PHOS  --  3.4 3.9  --   --   --  4.8*  --  5.1*  --   < > = values in this interval not displayed.  Liver Function Tests:  Recent Labs Lab 09/15/15 1347  09/17/15 0501 09/18/15 0438 09/18/15 0835 09/19/15 0440 09/20/15 0351  AST 71*  --  66*  --  69* 74* 66*  ALT  22  --  16*  --  17 15* 12*  ALKPHOS 178*  --  153*  --  183* 222* 203*  BILITOT 7.1*  --  6.4*  --  5.7* 5.2* 5.0*  PROT 6.0*  --  6.0*  --  6.9 6.8 6.4*  ALBUMIN 1.6*  < > 1.6* 1.6* 1.6* 1.6* 1.8*  < > = values in this interval not displayed. No results for input(s): LIPASE, AMYLASE in the last 168 hours. No results for input(s): AMMONIA in the last 168 hours.  CBC:  Recent Labs Lab 09/14/15 0515  09/17/15 1132 09/18/15 0438 09/18/15 1330 09/18/15 2219 09/19/15 0440 09/20/15 0351  WBC 16.2*  < >  --  17.5* 17.8* 16.1* 16.8* 13.2*  NEUTROABS 13.7*  --  17.3*  --   --   --   --   --   HGB 7.8*  < >  --  7.9* 7.7* 8.4* 9.1* 8.1*  HCT 23.4*  < >  --  24.1* 23.9* 25.8* 27.9* 24.8*  MCV 81.5  < >  --  82.8 84.2 83.0 81.8 84.1  PLT 164  < >  --  230 218 227 234 210  < > = values in this interval not displayed.  INR:  Recent Labs Lab 09/17/15 0501 09/18/15 0438 09/19/15 0440 09/20/15 0351  INR 1.54* 1.62* 1.69* 1.65*    Other results:    Imaging: Dg Chest Port 1 View  09/20/2015  CLINICAL DATA:  Left ventricular assist device followup EXAM: PORTABLE CHEST 1 VIEW  COMPARISON:  09/18/2015 FINDINGS: Cardiac enlargement stable. Left ventricular assist device, left atrial appendage clip, cardiac pacer, feeding tube, and right central line all appear stable. Mild vascular congestion with no edema or consolidation. IMPRESSION: No significant change from prior study Electronically Signed   By: Skipper Cliche M.D.   On: 09/20/2015 07:27     Medications:     Scheduled Medications: . sodium chloride   Intravenous Once  . allopurinol  100 mg Oral Daily  . alteplase  4 mg Intracatheter Once  .  ceFAZolin (ANCEF) IV  1 g Intravenous Q24H  . citalopram  20 mg Oral Daily  . docusate sodium  200 mg Oral Daily  . feeding supplement (PRO-STAT SUGAR FREE 64)  30 mL Per Tube TID  . magic mouthwash  10 mL Oral TID  . metoCLOPramide (REGLAN) injection  5 mg Intravenous Q6H  . pantoprazole  40 mg Oral QHS  . potassium chloride  40 mEq Oral BID  . pregabalin  75 mg Oral Daily  . rifampin (RIFADIN) IVPB  300 mg Intravenous Q12H  . sildenafil  20 mg Oral Q8H  . simethicone  80 mg Oral QID  . sodium chloride flush  10-40 mL Intracatheter Q12H  . tamsulosin  0.4 mg Oral Daily  . vancomycin  125 mg Oral Q12H  . warfarin  4 mg Oral q1800  . Warfarin - Physician Dosing Inpatient   Does not apply q1800    Infusions: . sodium chloride 10 mL/hr at 09/20/15 0522  . feeding supplement (NEPRO CARB STEADY) 1,000 mL (09/19/15 1727)    PRN Medications: sodium chloride, sodium chloride, bisacodyl **OR** bisacodyl, fentaNYL (SUBLIMAZE) injection, gelatin adsorbable, Gerhardt's butt cream, hydrALAZINE, ondansetron, oxyCODONE, sodium chloride flush, traMADol   Assessment:   1. LVAD Complication- Driveline abscess 2. MSSA bacteremia -> septic shock 3. Acute/Chronic systolic HF s/p VAD placement 9/16 4. C. Difficile colitis 5. A fib/flutter- Off amio  6. RV  failure previously on milrinone 7. Acute blood loss anemia 8. Severe Malnutrition.   9.. ARF 10. Klebsiella (ESBL)  pneumonia 11. NSVT  12. Hypokalemia  13. RV Failure  14. Malnutrition   Plan/Discussion:    S/P I&D LVAD driveline abscess. Exposed hardware. 09/09/15 S/P ABRA and reapplication of ACell. ABR removed 5/30 and wound closed by Dr Darcey Nora. On Coumadin. INR 1.6   WBC 13.2.Marland Kitchen   Remains on cefazolin + rifampin for MSSA bacteremia and oral vanc for c.diff. Klebseilla PNA has been treated. Off po rifampin due to poor po intake. Continue IV rifampin. Question whether or not oral rifampin is contributing to nausea.  Blood cultures.  NGTD. Urine cultures pending. For now hold off on fungal coverage. Dr Baxter Flattery following closely   Stable co-ox 63%.   Tolerating TFs. No nausea today.  EGD normal. Hgb 8.1  Continue IV Protonix. Albumin 1.8.  Speech evaluated. Swallowing ok. Can advance diet.     Has RV failure. MAPs 92-107. Continue 20 mg sildenafil. Off hydralazine per Dr Jimmy Footman.        Continue with scleral icterus. Total bilirubin coming down. LDH ok so doubt hemolysis. Amiodarone on hold  Renal following. Making 50 cc urine. HD  today.   VAD parameters ok. Hgb 8.0. LDH ok.   PT following. Recommending CIR. CIR following.   NSR today.  Off amio for now with elevated LFTs.    Transfer to [employer] [employer] Could likely go to CIR Monday.   Length of Stay: [place] NP-C  09/20/2015, 7:32 AM  VAD Team --- VAD ISSUES ONLY--- Pager [phone] (7am - 7am)  Advanced Heart Failure Team  Pager [phone] (M-F; 7a - 4p)  Please contact [facility] for night-coverage after hours (4p -7a ) and weekends on [email or web address]

## 2015-09-20 NOTE — Progress Notes (Signed)
Patient ID: [name], male   DOB: 09-25-1970, 45 y.o.   MRN: [id number]    HeartMate 2 Rounding Note  Subjective:    Admitted from [facility] in [place] with driveline abscess. Blood cultures --> MSSA and has C diff. Remains on Nafcillin  and oral vancomycin.   08/27/15 S/P I &D Epigastric with hardware exposed and VAC placement.  5/11 repeat I&D and wound vac replacement.  5/11 TEE no vegetations on valves or ICD wire 5/22 S/P ABRA and Acell  5/30 EGD- normal. Wound Closure.   Continues to improve. Ambulating 300 feet. No nausea. Complaining of headache.    VAD INTERROGATION:  HeartMate II LVAD:  Flow 5.5  liters/min, speed 9200, power 5.7  PI 7.6 no PI events.     Objective:    Vital Signs:   Temp:  [98.5 F (36.9 C)-99.8 F (37.7 C)] 98.8 F (37.1 C) (06/02 1210) Pulse Rate:  [78-95] 78 (06/02 1330) Resp:  [11-22] 15 (06/02 1330) BP: (102-143)/(76-110) 111/96 mmHg (06/02 1330) SpO2:  [95 %-100 %] 100 % (06/02 1330) Weight:  [82.6 kg (182 lb 1.6 oz)] 82.6 kg (182 lb 1.6 oz) (06/02 0500) Last BM Date: 09/20/15 Mean arterial Pressure  90s    Intake/Output:   Intake/Output Summary (Last 24 hours) at 09/20/15 1338 Last data filed at 09/20/15 1200  Gross per 24 hour  Intake   1995 ml  Output   1755 ml  Net    240 ml     Physical Exam: General: In bed. NAD HEENT: Scleral icterus +panda Neck: supple. Carotids 2+ bilat; no bruits. No lymphadenopathy or thryomegaly appreciated. R subclavian trialysis cath dressing dry Cor: Mechanical heart sounds with LVAD hum present.Irregular.  Lungs: clear on  Abdomen: soft, tender,  + distended. + bowel sounds. Incisional Vac in place.    Driveline: C/D/I; securement device intact and driveline incorporated Extremities: no cyanosis, clubbing, rash, trace edema  Neuro: A & O x3  Telemetry:  NSR 90s.      Labs: Basic Metabolic Panel:  Recent Labs Lab 09/14/15 0906 09/15/15 0414 09/16/15 0409   09/16/15 2330 09/17/15 0501 09/18/15 0438 09/18/15 0835 09/19/15 0440 09/20/15 0351  NA  --  139 138  < > 136 139 138 139 138 140  K  --  3.6 3.7  < > 3.5 3.8 3.7 3.5 3.4* 3.0*  CL  --  104 106  < > 102 103 103 104 102 104  CO2  --  25 22  --  27 28 25 26 25 25   GLUCOSE  --  110* 105*  < > 99 76 83 98 93 100*  BUN  --  40* 51*  < > 18 20 35* 37* 51* 62*  CREATININE  --  5.11* 6.23*  < > 2.97* 3.41* 4.79* 4.96* 5.74* 6.72*  CALCIUM  --  8.4* 8.6*  --  7.9* 8.2* 8.7* 8.8* 8.7* 8.9  MG 2.2  --   --   --  2.0  --   --   --  2.1  --   PHOS  --  3.4 3.9  --   --   --  4.8*  --  5.1*  --   < > = values in this interval not displayed.  Liver Function Tests:  Recent Labs Lab 09/15/15 1347  09/17/15 0501 09/18/15 0438 09/18/15 0835 09/19/15 0440 09/20/15 0351  AST 71*  --  66*  --  69* 74* 66*  ALT  22  --  16*  --  17 15* 12*  ALKPHOS 178*  --  153*  --  183* 222* 203*  BILITOT 7.1*  --  6.4*  --  5.7* 5.2* 5.0*  PROT 6.0*  --  6.0*  --  6.9 6.8 6.4*  ALBUMIN 1.6*  < > 1.6* 1.6* 1.6* 1.6* 1.8*  < > = values in this interval not displayed. No results for input(s): LIPASE, AMYLASE in the last 168 hours. No results for input(s): AMMONIA in the last 168 hours.  CBC:  Recent Labs Lab 09/14/15 0515  09/17/15 1132 09/18/15 0438 09/18/15 1330 09/18/15 2219 09/19/15 0440 09/20/15 0351  WBC 16.2*  < >  --  17.5* 17.8* 16.1* 16.8* 13.2*  NEUTROABS 13.7*  --  17.3*  --   --   --   --   --   HGB 7.8*  < >  --  7.9* 7.7* 8.4* 9.1* 8.1*  HCT 23.4*  < >  --  24.1* 23.9* 25.8* 27.9* 24.8*  MCV 81.5  < >  --  82.8 84.2 83.0 81.8 84.1  PLT 164  < >  --  230 218 227 234 210  < > = values in this interval not displayed.  INR:  Recent Labs Lab 09/17/15 0501 09/18/15 0438 09/19/15 0440 09/20/15 0351  INR 1.54* 1.62* 1.69* 1.65*    Other results:    Imaging: Dg Chest Port 1 View  09/20/2015  CLINICAL DATA:  Left ventricular assist device followup EXAM: PORTABLE CHEST 1 VIEW  COMPARISON:  09/18/2015 FINDINGS: Cardiac enlargement stable. Left ventricular assist device, left atrial appendage clip, cardiac pacer, feeding tube, and right central line all appear stable. Mild vascular congestion with no edema or consolidation. IMPRESSION: No significant change from prior study Electronically Signed   By: Skipper Cliche M.D.   On: 09/20/2015 07:27     Medications:     Scheduled Medications: . sodium chloride   Intravenous Once  . allopurinol  100 mg Oral Daily  . alteplase  4 mg Intracatheter Once  .  ceFAZolin (ANCEF) IV  1 g Intravenous Q24H  . citalopram  20 mg Oral Daily  . docusate sodium  200 mg Oral Daily  . feeding supplement (PRO-STAT SUGAR FREE 64)  30 mL Per Tube TID  . magic mouthwash  10 mL Oral TID  . metoCLOPramide (REGLAN) injection  5 mg Intravenous Q6H  . pantoprazole  40 mg Oral QHS  . potassium chloride  40 mEq Oral BID  . pregabalin  75 mg Oral Daily  . rifampin (RIFADIN) IVPB  300 mg Intravenous Q12H  . sildenafil  20 mg Oral Q8H  . simethicone  80 mg Oral QID  . sodium chloride flush  10-40 mL Intracatheter Q12H  . tamsulosin  0.4 mg Oral Daily  . vancomycin  125 mg Oral Q12H  . warfarin  4 mg Oral q1800  . Warfarin - Physician Dosing Inpatient   Does not apply q1800    Infusions: . sodium chloride 10 mL/hr at 09/20/15 0700  . feeding supplement (NEPRO CARB STEADY) 1,000 mL (09/19/15 1727)    PRN Medications: sodium chloride, sodium chloride, sodium chloride, sodium chloride, alteplase, bisacodyl **OR** bisacodyl, fentaNYL (SUBLIMAZE) injection, gelatin adsorbable, Gerhardt's butt cream, heparin, heparin, hydrALAZINE, lidocaine (PF), lidocaine-prilocaine, ondansetron, oxyCODONE, pentafluoroprop-tetrafluoroeth, sodium chloride flush, traMADol   Assessment:   1. LVAD Complication- Driveline abscess 2. MSSA bacteremia -> septic shock 3. Acute/Chronic systolic HF s/p VAD placement 9/16 4.  C. Difficile colitis 5. 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 Acute blood loss anemia 8. Severe Malnutrition.   9.. ARF 10. Klebsiella (ESBL) pneumonia 11. NSVT  12. Hypokalemia  13. RV Failure  14. Malnutrition   Plan/Discussion:    S/P I&D LVAD driveline abscess. Exposed hardware. 09/09/15 S/P ABRA and reapplication of ACell. ABR removed 5/30 and wound closed by Dr Darcey Nora. On Coumadin. INR 1.6   WBC 13.2.Marland Kitchen   Remains on cefazolin + rifampin for MSSA bacteremia and oral vanc for c.diff. Klebseilla PNA has been treated. Off po rifampin due to poor po intake. Continue IV rifampin. Question whether or not oral rifampin is contributing to nausea.  Blood cultures.  NGTD. Urine cultures pending. For now hold off on fungal coverage. Dr Baxter Flattery following closely   Stable co-ox 63%.   Tolerating TFs. No nausea today.  EGD normal. Hgb 8.1  Continue IV Protonix. Albumin 1.8.  Speech evaluated. Swallowing ok. Can advance diet.     Has RV failure. MAPs 92-107. Continue 20 mg sildenafil. Off hydralazine per Dr Jimmy Footman.        Continue with scleral icterus. Total bilirubin coming down. LDH ok so doubt hemolysis. Amiodarone on hold  Renal following. Making 50 cc urine. HD  today.   VAD parameters ok. Hgb 8.0. LDH ok.   PT following. Recommending CIR. CIR following.   NSR today.  Off amio for now with elevated LFTs.    Transfer to [employer] Could likely go to CIR Monday.   Length of Stay: [place] NP-C  09/20/2015, 1:38 PM  VAD Team --- VAD ISSUES ONLY--- Pager [phone] (7am - 7am)  Advanced Heart Failure Team  Pager [phone] (M-F; 7a - 4p)  Please contact [facility] for night-coverage after hours (4p -7a ) and weekends on [email or web address]     Patient seen and examined with Darrick Grinder, NP. We discussed all aspects of the encounter. I agree with the assessment and plan as stated above.   Continues to improve. Wound now closed. Making more urine. Co-ox stable. WBC falling. Less nausea.  Tolerating TFs well. Starting to ambulate.   Hgb down slightly. Will need to follow.   Continue HD as needed. Hopefully will be off soon. MAPs ok now. Use prn hydralazine. Per Renal will let BP run a bit to help with renal recovery.  Agree with moving to SDU. Hopefully to CIR early next week. VAD parameters stable.  [name], Daniel,MD 1:40 PM

## 2015-09-20 NOTE — Discharge Instructions (Signed)

## 2015-09-20 NOTE — Progress Notes (Signed)
3 Days Post-Op Procedure(s) (LRB): ABDOMINAL WOUND CLOSURE WITH INCISIONAL VAC APPLICATION (N/A) ESOPHAGOGASTRODUODENOSCOPY (EGD) (N/A) Subjective: Stable night Making more urine WBC stable No nausea- passed swallow test  Objective: Vital signs in last 24 hours: Temp:  [98.5 F (36.9 C)-99.8 F (37.7 C)] 99.4 F (37.4 C) (06/02 0700) Pulse Rate:  [85-169] 88 (06/02 0900) Cardiac Rhythm:  [-] Normal sinus rhythm (06/02 0800) Resp:  [11-28] 15 (06/02 0900) BP: (102-128)/(76-96) 112/95 mmHg (06/02 0900) SpO2:  [95 %-98 %] 98 % (06/02 0900) Weight:  [182 lb 1.6 oz (82.6 kg)] 182 lb 1.6 oz (82.6 kg) (06/02 0500)  Hemodynamic parameters for last 24 hours:    Intake/Output from previous day: 06/01 0701 - 06/02 0700 In: 2227 [P.O.:400; I.V.:110; NG/GT:1200; IV [id number] Out: 1640 [Urine:1640] Intake/Output this shift: Total I/O In: 110 [I.V.:20; NG/GT:90] Out: 240 [Urine:240]  Abdominal wound inspected and incisional vac]preveena] sponge changed Wound is closed but not healed- scant serosang drainage  Lab Results:  Recent Labs  09/19/15 0440 09/20/15 0351  WBC 16.8* 13.2*  HGB 9.1* 8.1*  HCT 27.9* 24.8*  PLT 234 210   BMET:  Recent Labs  09/19/15 0440 09/20/15 0351  NA 138 140  K 3.4* 3.0*  CL 102 104  CO2 25 25  GLUCOSE 93 100*  BUN 51* 62*  CREATININE 5.74* 6.72*  CALCIUM 8.7* 8.9    PT/INR:  Recent Labs  09/20/15 0351  LABPROT 19.5*  INR 1.65*   ABG    Component Value Date/Time   PHART 7.332* 09/09/2015 1145   HCO3 20.2 09/09/2015 1145   TCO2 26 09/16/2015 2318   ACIDBASEDEF 5.0* 09/09/2015 1145   O2SAT 62.5 09/20/2015 0340   CBG (last 3)   Recent Labs  09/19/15 2351 09/20/15 0504 09/20/15 0824  GLUCAP 106* 96 112*    Assessment/Plan: S/P Procedure(s) (LRB): ABDOMINAL WOUND CLOSURE WITH INCISIONAL VAC APPLICATION (N/A) ESOPHAGOGASTRODUODENOSCOPY (EGD) (N/A) Cont current wound care tx to stepdown   LOS: 25 days    Tharon Aquas Trigt III 09/20/2015

## 2015-09-20 NOTE — Procedures (Signed)
I was present at this session.  I have reviewed the session itself and made appropriate changes.  Hd via temp cath.  bp low 100s, tol well.  Savina Olshefski L 6/2/20172:53 PM

## 2015-09-20 NOTE — Progress Notes (Signed)
Rehab admissions - I will open the case with [facility] insurance and will request acute inpatient rehab admission for Monday.  I spoke with patient and he is in agreement.  Call me for questions.  RC:[phone]

## 2015-09-20 NOTE — Progress Notes (Signed)
[place] for Infectious Disease   Reason for visit: Follow up on C diff, drive line infection  Interval History: ambulating; no complaints    Physical Exam: Constitutional:  Filed Vitals:   09/20/15 1635 09/20/15 1640  BP: 115/84   Pulse: 91 92  Temp: 98.7 F (37.1 C)   Resp: 16 14   patient appears in NAD Eyes: anicteric HENT: no thrush Respiratory: Normal respiratory effort; CTA B Cardiovascular: LVAD  GI: soft, nt, less distended, +normoactive bowel sounds  Review of Systems: Constitutional: negative for fevers and chills Gastrointestinal: negative for diarrhea Hematologic/lymphatic: negative for lymphadenopathy  Lab Results  Component Value Date   WBC 13.2* 09/20/2015   HGB 8.1* 09/20/2015   HCT 24.8* 09/20/2015   MCV 84.1 09/20/2015   PLT 210 09/20/2015    Lab Results  Component Value Date   CREATININE 6.72* 09/20/2015   BUN 62* 09/20/2015   NA 140 09/20/2015   K 3.0* 09/20/2015   CL 104 09/20/2015   CO2 25 09/20/2015    Lab Results  Component Value Date   ALT 12* 09/20/2015   AST 66* 09/20/2015   ALKPHOS 203* 09/20/2015     Microbiology: Recent Results (from the past 240 hour(s))  C difficile quick scan w PCR reflex     Status: None   Collection Time: 09/15/15  9:25 AM  Result Value Ref Range Status   C Diff antigen NEGATIVE NEGATIVE Final   C Diff toxin NEGATIVE NEGATIVE Final   C Diff interpretation Negative for toxigenic C. difficile  Final  Urine culture     Status: Abnormal   Collection Time: 09/17/15 11:17 AM  Result Value Ref Range Status   Specimen Description URINE, CATHETERIZED  Final   Special Requests NONE  Final   Culture >=100,000 COLONIES/mL YEAST (A)  Final   Report Status 09/18/2015 FINAL  Final  Aerobic Culture (superficial specimen) (NOT AT [facility])     Status: None   Collection Time: 09/17/15  1:24 PM  Result Value Ref Range Status   Specimen Description WOUND ABDOMEN  Final   Special Requests PATIENT ON  FOLLOWING  VANCOMYCIN  Final   Gram Stain   Final    ABUNDANT WBC PRESENT,BOTH PMN AND MONONUCLEAR NO ORGANISMS SEEN    Culture NO GROWTH 2 DAYS  Final   Report Status 09/20/2015 FINAL  Final  Culture, blood (routine x 2)     Status: None (Preliminary result)   Collection Time: 09/17/15  9:29 PM  Result Value Ref Range Status   Specimen Description BLOOD BLOOD LEFT HAND  Final   Special Requests BOTTLES DRAWN AEROBIC AND ANAEROBIC 5CC  Final   Culture NO GROWTH 3 DAYS  Final   Report Status PENDING  Incomplete  Culture, blood (routine x 2)     Status: None (Preliminary result)   Collection Time: 09/17/15  9:36 PM  Result Value Ref Range Status   Specimen Description BLOOD BLOOD LEFT HAND  Final   Special Requests BOTTLES DRAWN AEROBIC ONLY 5CC  Final   Culture NO GROWTH 3 DAYS  Final   Report Status PENDING  Incomplete    Impression/Plan:  1. mssa drive line infection - on cefazolin and IV rifampin and now tolerating rifampin well.   2. C diff - on bid oral vanco taper 3. Leukocytosis - WBC down to 13.  No new signs of infection.  4. dispo - may go to CIR next week.   Dr. Tommy Medal available  over the weekend if needed, otherwise I will follow up on Monday

## 2015-09-20 NOTE — Progress Notes (Signed)
Called in house HD department and spoke with their director about anticipating need for [name] to travel to the HD department for treatments when he is approved to go to inpatient rehab. [name] is independent in managing his equipment so the HD nurses will need to be trained as [name]. HD charge nurse phone # [phone] will plan on arranging VAD Aware training in the near future with their team.   [name], RN [place] Coordinator   Office: [phone] 24/7 VAD Pager: [phone]

## 2015-09-20 NOTE — Progress Notes (Signed)
Subjective: Interval History: has no complaint .  Objective: Vital signs in last 24 hours: Temp:  [98.5 F (36.9 C)-99.8 F (37.7 C)] 99 F (37.2 C) (06/02 0400) Pulse Rate:  [86-169] 91 (06/02 0625) Resp:  [15-28] 22 (06/02 0625) BP: (100-128)/(76-96) 114/86 mmHg (06/02 0500) SpO2:  [95 %-98 %] 98 % (06/02 0625) Weight:  [82.6 kg (182 lb 1.6 oz)] 82.6 kg (182 lb 1.6 oz) (06/02 0500) Weight change: 0.2 kg (7.1 oz)  Intake/Output from previous day: 06/01 0701 - 06/02 0700 In: 2172 [P.O.:400; I.V.:100; NG/GT:1155; IV [id number] Out: 1640 [Urine:1640] Intake/Output this shift: Total I/O In: 945 [P.O.:100; I.V.:90; NG/GT:555; IV Piggyback:200] Out: 840 [Urine:840]  General appearance: alert, cooperative and no distress Resp: rales bibasilar Cardio: S1, S2 normal, systolic murmur: holosystolic 2/6, blowing at apex and hum of LVAD GI: soft, non-tender; bowel sounds normal; no masses,  no organomegaly and vac midline, driveline on R Extremities: edema 1-2+  Abdm distended yet  Lab Results:  Recent Labs  09/19/15 0440 09/20/15 0351  WBC 16.8* 13.2*  HGB 9.1* 8.1*  HCT 27.9* 24.8*  PLT 234 210   BMET:  Recent Labs  09/19/15 0440 09/20/15 0351  NA 138 140  K 3.4* 3.0*  CL 102 104  CO2 25 25  GLUCOSE 93 100*  BUN 51* 62*  CREATININE 5.74* 6.72*  CALCIUM 8.7* 8.9   No results for input(s): PTH in the last 72 hours. Iron Studies:  Recent Labs  09/18/15 0750  IRON 34*  TIBC 164*    Studies/Results: Dg Chest Port 1 View  09/18/2015  CLINICAL DATA:  Shortness of breath. EXAM: PORTABLE CHEST 1 VIEW COMPARISON:  09/15/2015. FINDINGS: Feeding tube and right subclavian line in stable position. Cardiac pacer in stable position. Prior CABG. Left ventricular assist device in stable position. Left atrial appendage clip in stable position. Stable cardiomegaly. Low lung volumes with mild bibasilar atelectasis. No pleural effusion or pneumothorax. IMPRESSION: 1. Lines and  tubes in stable position. Cardiac pacer left ventricular assist device in stable position. Prior CABG. Left atrial appendage clip in stable position. 2. Low lung volumes with mild bibasilar atelectasis. Electronically Signed   By: Marcello Moores  Register   On: 09/18/2015 08:23    I have reviewed the patient's current medications.  Assessment/Plan: 1 AKI nonoliguric, very little function. Will do HD . Has vol xs.  Acid/base/K ok. 2 Anemia Fe given.  3 LVAD.  Per cards/[facility] 4 Driveline infx with Staph on Ancef, Rif 5 Ileus still an issue ? Related to infx 6 ^ LFTs better P HD, follow hb, keep bp up.    LOS: 25 days   [name] 09/20/2015,7:00 AM

## 2015-09-20 NOTE — Care Management Note (Signed)
Case Management Note  Patient Details  Name: [name] MRN: [id number] Date of Birth: 05/01/1970  Subjective/Objective:   Pt admitted with driveline infection from LVAD implanted in 9/16                Action/Plan:  PTA pt was independent from home alone , previously utilized [facility] for [facility] and IV Milrinone - however services were no longer active prior to this admit.  Plan is for pt to return to OR 09/02/15.  CM will continue to monitor for disposition needs   Expected Discharge Date:                  Expected Discharge Plan:  IP Rehab Facility  In-House Referral:  Clinical Social Work  Discharge planning Services  CM Consult  Post Acute Care Choice:    Choice offered to:     DME Arranged:    DME Agency:     HH Arranged:    [place] Agency:     Status of Service:  In process, will continue to follow  Medicare Important Message Given:    Date Medicare IM Given:    Medicare IM give by:    Date Additional Medicare IM Given:    Additional Medicare Important Message give by:     If discussed at [place] of Stay Meetings, dates discussed:    Additional Comments:   09/20/2015  CM submitted benefit check for Revatio REVATIO 20 MG Q8   COVER- NO  PRIOR APPROVAL - YES # [id number]   GENERIC:  SILDENAFIO 20 MG Q 8   COVER- YES  CO-PAY- $ 7.00  TIER- 1 DRUG  PRIOR APPROVAL NO  PHARMACY : [facility] # [phone] FAX # [phone]   OUTPATIENT  * MUST SAY SPECIALTY PHARMACY   MAIL - ORDER 90 DAY SUPPLY $ 14.00   09/20/15 CM contacted by CM with [employer] requesting possible LTACH discharge - CM communicated that LTACH was not an option for pt per HF team.  CM left voice mail for physician advisor to inform of conversation and pending peer to peer.  09/19/15 Discussed pt in LOS 09/19/15; Physician Advisor request HF access when pt may be able to transition to next level of care/facility.  CM spoke in depth with HF NP - plan is for pt to transfer to CIR however HD  will need to be trained on HD on LVAD pt.  CM spoke with CIR regarding plans to accept pt - CIR is not committed at this time to accepting pt due to complexity of care.  CM communicated this to HF NP and mentioned option of LTACH - per NP pt will not discharge to CIR.  CSW following for SNF back up plan  09/10/15 CIR following - CSW consulted as back up plan  09/05/15 Pt remains on pressor.  Pt taken back to OR today for wound debridement.   Maryclare Labrador, RN 09/20/2015, 10:47 AM

## 2015-09-20 NOTE — Clinical Social Work Note (Signed)
CSW continuing to follow for support and discharge needs. Shonny Jakyah Bradby, LCSW [phone] 

## 2015-09-21 LAB — GLUCOSE, CAPILLARY
GLUCOSE-CAPILLARY: 101 mg/dL — AB (ref 65–99)
GLUCOSE-CAPILLARY: 92 mg/dL (ref 65–99)
GLUCOSE-CAPILLARY: 82 mg/dL (ref 65–99)
GLUCOSE-CAPILLARY: 92 mg/dL (ref 65–99)
Glucose-Capillary: 103 mg/dL — ABNORMAL HIGH (ref 65–99)
Glucose-Capillary: 86 mg/dL (ref 65–99)

## 2015-09-21 LAB — COMPREHENSIVE METABOLIC PANEL
ALBUMIN: 1.7 g/dL — AB (ref 3.5–5.0)
ALT: 18 U/L (ref 17–63)
AST: 94 U/L — AB (ref 15–41)
Alkaline Phosphatase: 227 U/L — ABNORMAL HIGH (ref 38–126)
Anion gap: 10 (ref 5–15)
BUN: 42 mg/dL — AB (ref 6–20)
CHLORIDE: 102 mmol/L (ref 101–111)
CO2: 27 mmol/L (ref 22–32)
CREATININE: 4.79 mg/dL — AB (ref 0.61–1.24)
Calcium: 8.8 mg/dL — ABNORMAL LOW (ref 8.9–10.3)
GFR calc Af Amer: 15 mL/min — ABNORMAL LOW (ref >60)
GFR, EST NON AFRICAN AMERICAN: 13 mL/min — AB (ref >60)
GLUCOSE: 96 mg/dL (ref 65–99)
POTASSIUM: 3.6 mmol/L (ref 3.5–5.1)
Sodium: 139 mmol/L (ref 135–145)
Total Bilirubin: 5.1 mg/dL — ABNORMAL HIGH (ref 0.3–1.2)
Total Protein: 6.6 g/dL (ref 6.5–8.1)

## 2015-09-21 LAB — PHOSPHORUS: Phosphorus: 4.1 mg/dL (ref 2.5–4.6)

## 2015-09-21 LAB — CBC
HCT: 28.3 % — ABNORMAL LOW (ref 39.0–52.0)
Hemoglobin: 9 g/dL — ABNORMAL LOW (ref 13.0–17.0)
MCH: 26.8 pg (ref 26.0–34.0)
MCHC: 31.8 g/dL (ref 30.0–36.0)
MCV: 84.2 fL (ref 78.0–100.0)
PLATELETS: 228 10*3/uL (ref 150–400)
RBC: 3.36 MIL/uL — AB (ref 4.22–5.81)
RDW: 19.6 % — AB (ref 11.5–15.5)
WBC: 13.8 10*3/uL — ABNORMAL HIGH (ref 4.0–10.5)

## 2015-09-21 LAB — CARBOXYHEMOGLOBIN
CARBOXYHEMOGLOBIN: 2.3 % — AB (ref 0.5–1.5)
METHEMOGLOBIN: 1 % (ref 0.0–1.5)
O2 SAT: 72.2 %
TOTAL HEMOGLOBIN: 9.3 g/dL — AB (ref 13.5–18.0)

## 2015-09-21 LAB — LACTATE DEHYDROGENASE: LDH: 271 U/L — AB (ref 98–192)

## 2015-09-21 LAB — PROTIME-INR
INR: 1.64 — ABNORMAL HIGH (ref 0.00–1.49)
Prothrombin Time: 19.5 seconds — ABNORMAL HIGH (ref 11.6–15.2)

## 2015-09-21 MED ORDER — POTASSIUM CHLORIDE CRYS ER 20 MEQ PO TBCR
40.0000 meq | EXTENDED_RELEASE_TABLET | Freq: Two times a day (BID) | ORAL | Status: DC
  Administered 2015-09-21 – 2015-09-23 (×6): 40 meq via ORAL
  Filled 2015-09-21 (×7): qty 2

## 2015-09-21 MED ORDER — MENTHOL 3 MG MT LOZG
1.0000 | LOZENGE | OROMUCOSAL | Status: DC | PRN
  Filled 2015-09-21: qty 9

## 2015-09-21 MED ORDER — GUAIFENESIN-DM 100-10 MG/5ML PO SYRP
5.0000 mL | ORAL_SOLUTION | ORAL | Status: DC | PRN
  Administered 2015-09-21 – 2015-09-22 (×2): 5 mL via ORAL
  Filled 2015-09-21 (×2): qty 5

## 2015-09-21 MED ORDER — FAMOTIDINE IN NACL 20-0.9 MG/50ML-% IV SOLN
20.0000 mg | Freq: Two times a day (BID) | INTRAVENOUS | Status: DC
  Administered 2015-09-21 – 2015-09-22 (×4): 20 mg via INTRAVENOUS
  Filled 2015-09-21 (×4): qty 50

## 2015-09-21 NOTE — Progress Notes (Signed)
Patient ID: [name], male   DOB: 1970-12-28, 45 y.o.   MRN: [id number]    HeartMate 2 Rounding Note  Subjective:    Admitted from [facility] in [place] with driveline abscess. Blood cultures --> MSSA and has C diff. Remains on Nafcillin  and oral vancomycin.   08/27/15 S/P I &D Epigastric with hardware exposed and VAC placement.  5/11 repeat I&D and wound vac replacement.  5/11 TEE no vegetations on valves or ICD wire 5/22 S/P ABRA and Acell  5/30 EGD- normal. Wound Closure.   Feels good this morning.  NG tube came out but eating better and does not want back in. Spiked fever to 102 earlier this morning.  WBCs stable.    VAD INTERROGATION:  HeartMate II LVAD:  Flow 5  liters/min, speed 9200, power 5.4  PI 7.6. No PI events.     Objective:    Vital Signs:   Temp:  [98.2 F (36.8 C)-102 F (38.9 C)] 102 F (38.9 C) (06/03 0813) Pulse Rate:  [74-108] 107 (06/03 0819) Resp:  [13-23] 19 (06/03 0819) BP: (99-143)/(76-110) 132/91 mmHg (06/03 0819) SpO2:  [94 %-100 %] 94 % (06/03 0819) Weight:  [181 lb 7 oz (82.3 kg)-184 lb 11.9 oz (83.8 kg)] 181 lb 7 oz (82.3 kg) (06/02 1635) Last BM Date: 09/20/15 Mean arterial Pressure  90s    Intake/Output:   Intake/Output Summary (Last 24 hours) at 09/21/15 0931 Last data filed at 09/21/15 0600  Gross per 24 hour  Intake   1195 ml  Output   2900 ml  Net  -1705 ml     Physical Exam: General: In bed. NAD HEENT: Scleral icterus  Neck: supple. JVP 10 cm. Carotids 2+ bilat; no bruits. No lymphadenopathy or thryomegaly appreciated. R subclavian trialysis cath dressing dry Cor: Mechanical heart sounds with LVAD hum present.Irregular.  Lungs: clear on  Abdomen: soft, tender,  + mildly distended. + bowel sounds. Incisional Vac in place.    Driveline: C/D/I; securement device intact and driveline incorporated Extremities: no cyanosis, clubbing, rash, trace edema  Neuro: A & O x3  Telemetry:  NSR 90s.       Labs: Basic Metabolic Panel:  Recent Labs Lab 09/15/15 0414 09/16/15 0409  09/16/15 2330  09/18/15 0438 09/18/15 0835 09/19/15 0440 09/20/15 0351 09/21/15 0437  NA 139 138  < > 136  < > 138 139 138 140 139  K 3.6 3.7  < > 3.5  < > 3.7 3.5 3.4* 3.0* 3.6  CL 104 106  < > 102  < > 103 104 102 104 102  CO2 25 22  --  27  < > 25 26 25 25 27   GLUCOSE 110* 105*  < > 99  < > 83 98 93 100* 96  BUN 40* 51*  < > 18  < > 35* 37* 51* 62* 42*  CREATININE 5.11* 6.23*  < > 2.97*  < > 4.79* 4.96* 5.74* 6.72* 4.79*  CALCIUM 8.4* 8.6*  --  7.9*  < > 8.7* 8.8* 8.7* 8.9 8.8*  MG  --   --   --  2.0  --   --   --  2.1  --   --   PHOS 3.4 3.9  --   --   --  4.8*  --  5.1*  --  4.1  < > = values in this interval not displayed.  Liver Function Tests:  Recent Labs Lab 09/17/15 0501 09/18/15 0438 09/18/15  [id number] 09/19/15 0440 09/20/15 0351 09/21/15 0437  AST 66*  --  69* 74* 66* 94*  ALT 16*  --  17 15* 12* 18  ALKPHOS 153*  --  183* 222* 203* 227*  BILITOT 6.4*  --  5.7* 5.2* 5.0* 5.1*  PROT 6.0*  --  6.9 6.8 6.4* 6.6  ALBUMIN 1.6* 1.6* 1.6* 1.6* 1.8* 1.7*   No results for input(s): LIPASE, AMYLASE in the last 168 hours. No results for input(s): AMMONIA in the last 168 hours.  CBC:  Recent Labs Lab 09/17/15 1132  09/18/15 1330 09/18/15 2219 09/19/15 0440 09/20/15 0351 09/21/15 0437  WBC  --   < > 17.8* 16.1* 16.8* 13.2* 13.8*  NEUTROABS 17.3*  --   --   --   --   --   --   HGB  --   < > 7.7* 8.4* 9.1* 8.1* 9.0*  HCT  --   < > 23.9* 25.8* 27.9* 24.8* 28.3*  MCV  --   < > 84.2 83.0 81.8 84.1 84.2  PLT  --   < > 218 227 234 210 228  < > = values in this interval not displayed.  INR:  Recent Labs Lab 09/17/15 0501 09/18/15 0438 09/19/15 0440 09/20/15 0351 09/21/15 0437  INR 1.54* 1.62* 1.69* 1.65* 1.64*    Other results:    Imaging: Dg Chest Port 1 View  09/20/2015  CLINICAL DATA:  Left ventricular assist device followup EXAM: PORTABLE CHEST 1 VIEW COMPARISON:   09/18/2015 FINDINGS: Cardiac enlargement stable. Left ventricular assist device, left atrial appendage clip, cardiac pacer, feeding tube, and right central line all appear stable. Mild vascular congestion with no edema or consolidation. IMPRESSION: No significant change from prior study Electronically Signed   By: Skipper Cliche M.D.   On: 09/20/2015 07:27     Medications:     Scheduled Medications: . sodium chloride   Intravenous Once  . allopurinol  100 mg Oral Daily  . alteplase  4 mg Intracatheter Once  .  ceFAZolin (ANCEF) IV  1 g Intravenous Q24H  . citalopram  20 mg Oral Daily  . docusate sodium  200 mg Oral Daily  . famotidine (PEPCID) IV  20 mg Intravenous Q12H  . feeding supplement (PRO-STAT SUGAR FREE 64)  30 mL Per Tube TID  . magic mouthwash  10 mL Oral TID  . metoCLOPramide (REGLAN) injection  5 mg Intravenous Q6H  . potassium chloride  40 mEq Oral BID  . pregabalin  75 mg Oral Daily  . rifampin (RIFADIN) IVPB  300 mg Intravenous Q12H  . sildenafil  20 mg Oral Q8H  . simethicone  80 mg Oral QID  . sodium chloride flush  10-40 mL Intracatheter Q12H  . tamsulosin  0.4 mg Oral Daily  . vancomycin  125 mg Oral Q12H  . warfarin  4 mg Oral q1800  . Warfarin - Physician Dosing Inpatient   Does not apply q1800    Infusions: . sodium chloride 10 mL/hr at 09/20/15 0700  . feeding supplement (NEPRO CARB STEADY) 1,000 mL (09/21/15 0000)    PRN Medications: sodium chloride, sodium chloride, sodium chloride, sodium chloride, acetaminophen (TYLENOL) oral liquid 160 mg/5 mL, alteplase, bisacodyl **OR** bisacodyl, fentaNYL (SUBLIMAZE) injection, gelatin adsorbable, Gerhardt's butt cream, heparin, heparin, hydrALAZINE, lidocaine (PF), lidocaine-prilocaine, ondansetron, oxyCODONE, pentafluoroprop-tetrafluoroeth, sodium chloride flush, traMADol   Assessment:   1. LVAD Complication- Driveline abscess 2. MSSA bacteremia -> septic shock 3. Acute/Chronic systolic HF s/p VAD  placement 9/16  4. C. Difficile colitis 5. 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 Acute blood loss anemia 8. Severe Malnutrition.   9. AKI - currently requiring HD.  10. Klebsiella (ESBL) pneumonia 11. NSVT  12. Hypokalemia  13. RV Failure  14. Malnutrition  Plan/Discussion:    S/P I&D LVAD driveline abscess. Exposed hardware. 09/09/15 S/P ABRA and reapplication of ACell. ABR removed 5/30 and wound closed by Dr Prescott Gum. Coumadin restarted, INR 1.64.    WBC 13.8, stable.  Remains on cefazolin + rifampin for MSSA bacteremia and oral vanc for C difficile.  K[place]se[place] PNA has been treated. Off po rifampin due to poor po intake. Continue IV rifampin. Fever to 102 last night, will reculture urine and blood and discontinue foley catheter.    Good co-ox at 72%.   NG tube pulled out.  Tolerating liquid diet, advance to heart healthy today.  No nausea.  Has RV failure. MAP 90s. Continue 20 mg sildenafil tid. Off hydralazine per Dr Jimmy Footman, let BP run a bit higher to help with renal recovery.       Continue with scleral icterus and elevated total bilirubin. LDH stable so doubt hemolysis. Amiodarone on hold.   Renal following. UOP improving, 1640 cc out yesterday.  Plan for HD again tomorrow.   VAD parameters ok. Hemoglobin higher.  LDH stable.   PT following. Recommending CIR. CIR following.   NSR today.  Off amio for now with elevated LFTs.    Could likely go to CIR Monday.   Length of Stay: [place]   09/21/2015, 9:31 AM  VAD Team --- VAD ISSUES ONLY--- Pager [phone] (7am - 7am)  Advanced Heart Failure Team  Pager [phone] (M-F; 7a - 4p)  Please contact [facility] for night-coverage after hours (4p -7a ) and weekends on [email or web address]

## 2015-09-21 NOTE — Progress Notes (Signed)
Left nare NG tube length from outside was long, discussed with outgoing nurse who claimed that it is the desired length. NG tube came out when patient coughed. Pt refused reinsertion. MD aware. Continue to monitor. Also, with order to DC foley cath, patient refused and asked to do it later.

## 2015-09-21 NOTE — Progress Notes (Signed)
Complained  Of dizziness when out of bed to BS commode. Instructed not to get out of bed and to call for help when needed. Call light within reach. MD aware.

## 2015-09-21 NOTE — Progress Notes (Signed)
Subjective: Interval History: has no complaint , abdm still tight, and some dyscomfort.  Objective: Vital signs in last 24 hours: Temp:  [98.2 F (36.8 C)-99.7 F (37.6 C)] 99.5 F (37.5 C) (06/03 0415) Pulse Rate:  [74-108] 108 (06/03 0600) Resp:  [13-23] 23 (06/03 0600) BP: (99-143)/(76-110) 122/89 mmHg (06/03 0600) SpO2:  [96 %-100 %] 96 % (06/03 0600) Weight:  [82.3 kg (181 lb 7 oz)-83.8 kg (184 lb 11.9 oz)] 82.3 kg (181 lb 7 oz) (06/02 1635) Weight change: 1.2 kg (2 lb 10.3 oz)  Intake/Output from previous day: 06/02 0701 - 06/03 0700 In: 1305 [I.V.:120; NG/GT:1035; IV Piggyback:150] Out: 3140 [Urine:1640] Intake/Output this shift:    General appearance: alert, cooperative and no distress Resp: diminished breath sounds bilaterally and rales bibasilar Chest wall: R [place] cath Cardio: S1, S2 normal, systolic murmur: holosystolic 3/6, blowing at apex and hum of LVAD GI: mod distension , pos bs, but erratic, vac midline, driveline lat on R Extremities: edema 2+  Lab Results:  Recent Labs  09/20/15 0351 09/21/15 0437  WBC 13.2* 13.8*  HGB 8.1* 9.0*  HCT 24.8* 28.3*  PLT 210 228   BMET:  Recent Labs  09/20/15 0351 09/21/15 0437  NA 140 139  K 3.0* 3.6  CL 104 102  CO2 25 27  GLUCOSE 100* 96  BUN 62* 42*  CREATININE 6.72* 4.79*  CALCIUM 8.9 8.8*   No results for input(s): PTH in the last 72 hours. Iron Studies: No results for input(s): IRON, TIBC, TRANSFERRIN, FERRITIN in the last 72 hours.  Studies/Results: Dg Chest Port 1 View  09/20/2015  CLINICAL DATA:  Left ventricular assist device followup EXAM: PORTABLE CHEST 1 VIEW COMPARISON:  09/18/2015 FINDINGS: Cardiac enlargement stable. Left ventricular assist device, left atrial appendage clip, cardiac pacer, feeding tube, and right central line all appear stable. Mild vascular congestion with no edema or consolidation. IMPRESSION: No significant change from prior study Electronically Signed   By: Skipper Cliche  M.D.   On: 09/20/2015 07:27    I have reviewed the patient's current medications.  Assessment/Plan: 1 AKI nonolirguric .  Cr this am reflects HD yest, still vol xs.  Acid/base/K ok.   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

## 2015-09-22 LAB — CBC
HCT: 26 % — ABNORMAL LOW (ref 39.0–52.0)
Hemoglobin: 8.3 g/dL — ABNORMAL LOW (ref 13.0–17.0)
MCH: 26.1 pg (ref 26.0–34.0)
MCHC: 31.9 g/dL (ref 30.0–36.0)
MCV: 81.8 fL (ref 78.0–100.0)
PLATELETS: 189 10*3/uL (ref 150–400)
RBC: 3.18 MIL/uL — AB (ref 4.22–5.81)
RDW: 19.1 % — AB (ref 11.5–15.5)
WBC: 13.6 10*3/uL — ABNORMAL HIGH (ref 4.0–10.5)

## 2015-09-22 LAB — COMPREHENSIVE METABOLIC PANEL
ALT: 15 U/L — ABNORMAL LOW (ref 17–63)
ANION GAP: 12 (ref 5–15)
AST: 93 U/L — ABNORMAL HIGH (ref 15–41)
Albumin: 1.7 g/dL — ABNORMAL LOW (ref 3.5–5.0)
Alkaline Phosphatase: 222 U/L — ABNORMAL HIGH (ref 38–126)
BUN: 51 mg/dL — ABNORMAL HIGH (ref 6–20)
CHLORIDE: 104 mmol/L (ref 101–111)
CO2: 24 mmol/L (ref 22–32)
Calcium: 8.7 mg/dL — ABNORMAL LOW (ref 8.9–10.3)
Creatinine, Ser: 5.8 mg/dL — ABNORMAL HIGH (ref 0.61–1.24)
GFR calc non Af Amer: 10 mL/min — ABNORMAL LOW (ref >60)
GFR, EST AFRICAN AMERICAN: 12 mL/min — AB (ref >60)
Glucose, Bld: 78 mg/dL (ref 65–99)
Potassium: 4.1 mmol/L (ref 3.5–5.1)
SODIUM: 140 mmol/L (ref 135–145)
Total Bilirubin: 5.5 mg/dL — ABNORMAL HIGH (ref 0.3–1.2)
Total Protein: 6.8 g/dL (ref 6.5–8.1)

## 2015-09-22 LAB — CARBOXYHEMOGLOBIN
CARBOXYHEMOGLOBIN: 2.1 % — AB (ref 0.5–1.5)
METHEMOGLOBIN: 1.1 % (ref 0.0–1.5)
O2 Saturation: 55.5 %
Total hemoglobin: 9.1 g/dL — ABNORMAL LOW (ref 13.5–18.0)

## 2015-09-22 LAB — CULTURE, BLOOD (ROUTINE X 2)
Culture: NO GROWTH
Culture: NO GROWTH

## 2015-09-22 LAB — GLUCOSE, CAPILLARY
GLUCOSE-CAPILLARY: 104 mg/dL — AB (ref 65–99)
Glucose-Capillary: 95 mg/dL (ref 65–99)

## 2015-09-22 LAB — PROTIME-INR
INR: 2.08 — AB (ref 0.00–1.49)
PROTHROMBIN TIME: 23.2 s — AB (ref 11.6–15.2)

## 2015-09-22 LAB — URINE CULTURE: Culture: >=100000 — AB

## 2015-09-22 LAB — PHOSPHORUS: PHOSPHORUS: 5.4 mg/dL — AB (ref 2.5–4.6)

## 2015-09-22 LAB — LACTATE DEHYDROGENASE: LDH: 286 U/L — ABNORMAL HIGH (ref 98–192)

## 2015-09-22 MED ORDER — HYDRALAZINE HCL 25 MG PO TABS
25.0000 mg | ORAL_TABLET | Freq: Three times a day (TID) | ORAL | Status: DC
  Administered 2015-09-22 – 2015-09-25 (×11): 25 mg via ORAL
  Filled 2015-09-22 (×11): qty 1

## 2015-09-22 MED ORDER — CEFAZOLIN SODIUM-DEXTROSE 2-4 GM/100ML-% IV SOLN
2.0000 g | INTRAVENOUS | Status: DC
  Administered 2015-09-22 – 2015-09-24 (×3): 2 g via INTRAVENOUS
  Filled 2015-09-22 (×4): qty 100

## 2015-09-22 MED ORDER — WARFARIN SODIUM 2 MG PO TABS
2.0000 mg | ORAL_TABLET | Freq: Every day | ORAL | Status: DC
  Administered 2015-09-22: 2 mg via ORAL
  Filled 2015-09-22: qty 1

## 2015-09-22 MED ORDER — GUAIFENESIN 100 MG/5ML PO SOLN
5.0000 mL | ORAL | Status: DC | PRN
  Administered 2015-09-22 – 2015-09-24 (×3): 100 mg via ORAL
  Filled 2015-09-22 (×3): qty 5

## 2015-09-22 MED ORDER — FLUCONAZOLE IN SODIUM CHLORIDE 100-0.9 MG/50ML-% IV SOLN: 100.0000 mg | INTRAVENOUS | Status: DC

## 2015-09-22 MED ORDER — FLUCONAZOLE IN SODIUM CHLORIDE 100-0.9 MG/50ML-% IV SOLN
100.0000 mg | INTRAVENOUS | Status: DC
  Administered 2015-09-22 – 2015-09-25 (×4): 100 mg via INTRAVENOUS
  Filled 2015-09-22 (×4): qty 50

## 2015-09-22 MED ORDER — FAMOTIDINE IN NACL 20-0.9 MG/50ML-% IV SOLN
20.0000 mg | INTRAVENOUS | Status: DC
  Administered 2015-09-23 – 2015-09-25 (×3): 20 mg via INTRAVENOUS
  Filled 2015-09-22 (×3): qty 50

## 2015-09-22 NOTE — Progress Notes (Addendum)
Pharmacy Antibiotic Note  [name] is a 45 y.o. male admitted on 08/26/2015 with driveline infection now growing > 100k yeast in urine.  Pharmacy has been consulted for fluconazole dosing.  Afebrile, WBC 13.6. Cr 5.8 with last HD session 6/2. Since pt will not have regularly scheduled HD sessions, will decrease dose 50% for renal adjustment. Foley was removed yesterday, unknown timing of urine cx collection.   Fluconazole also increases serum concentration of warfarin, which has be adjusted in anticipation of INR elevation. INR therapeutic today at 2.08, pt also on rifampin.   Plan: Fluconazole 100 mg IV qday Monitor s/sx clinical improvement and length of therapy   Height: 5\' 7"  (170.2 cm) Weight: 181 lb 7 oz (82.3 kg) IBW/kg (Calculated) : 66.1  Temp (24hrs), Avg:98.9 F (37.2 C), Min:98.5 F (36.9 C), Max:99.7 F (37.6 C)   Recent Labs Lab 09/18/15 0835  09/18/15 2219 09/19/15 0440 09/20/15 0351 09/21/15 0437 09/22/15 0415  WBC  --   < > 16.1* 16.8* 13.2* 13.8* 13.6*  CREATININE 4.96*  --   --  5.74* 6.72* 4.79* 5.80*  < > = values in this interval not displayed.  Estimated Creatinine Clearance: 16.2 mL/min (by C-G formula based on Cr of 5.8).    Allergies  Allergen Reactions  . Phytonadione Other (See Comments)    Patient has LVAD: please check with LVAD coordinator on call or LVAD MD on call before reversal of anticoagulation with vit k    Antimicrobials this admission: 5/2 Nafcillin>5/15 5/15 imip>5/18 5/18 CTX > 5/23 5/23 Ancef> 5/2 po vanc> 5/15 vanc IV> 5/17 5/8 rifampin>  Dose adjustments this admission: 6/4 Ancef increased to 2g q24h  Microbiology results: 5/9 Abd abscess cx > MSSA 5/9 BCx x 2 > ngtd 5/15 sputum > Klebsiella (pan S except to ampicillin) 5/15 urine > negative 5/15 blood x 1 > ngtd 5/28 cdiff - neg 5/30 bld x2 -ngtd 5/30 urine - >100k yeast 6/3 urine - >100K yeast  Thank you for allowing pharmacy to be a part of this  patient's care.  Wynelle Fanny 09/22/2015 12:30 PM

## 2015-09-22 NOTE — Progress Notes (Signed)
Patient ID: [name], male   DOB: 1971-04-04, 45 y.o.   MRN: [id number]    HeartMate 2 Rounding Note  Subjective:    Admitted from [facility] in [place] with driveline abscess. Blood cultures --> MSSA and has C diff. Remains on cefazolin  and oral vancomycin.   08/27/15 S/P I &D Epigastric with hardware exposed and VAC placement.  5/11 repeat I&D and wound vac replacement.  5/11 TEE no vegetations on valves or ICD wire 5/22 S/P ABRA and Acell  5/30 EGD- normal. Wound Closure.   Mild abdominal pain, no change.  Ate some of breakfast, appetite not large.  No further fever.    UOP about 1600 cc yesterday.   Urine culture with > 100K yeast.    MAP running 100s-110s  VAD INTERROGATION:  HeartMate II LVAD:  Flow 5.5  liters/min, speed 9200, power 5.6  PI 7.1. No PI events.     Objective:    Vital Signs:   Temp:  [98.5 F (36.9 C)-99.7 F (37.6 C)] 98.8 F (37.1 C) (06/04 0900) Pulse Rate:  [53-96] 91 (06/04 0800) Resp:  [9-27] 14 (06/04 0900) BP: (107-133)/(74-98) 127/98 mmHg (06/04 0800) SpO2:  [94 %-100 %] 98 % (06/04 0800) Last BM Date: 09/21/15 Mean arterial Pressure  100s   Intake/Output:   Intake/Output Summary (Last 24 hours) at 09/22/15 0933 Last data filed at 09/22/15 0800  Gross per 24 hour  Intake   1690 ml  Output   1605 ml  Net     85 ml     Physical Exam: General: In bed. NAD HEENT: Scleral icterus  Neck: supple. JVP 10 cm. Carotids 2+ bilat; no bruits. No lymphadenopathy or thryomegaly appreciated. R subclavian trialysis cath dressing dry Cor: Mechanical heart sounds with LVAD hum present. Lungs: clear on  Abdomen: soft, tender,  + mildly distended. + bowel sounds. Incisional Vac in place.    Driveline: C/D/I; securement device intact and driveline incorporated Extremities: no cyanosis, clubbing, rash, trace edema  Neuro: A & O x3  Telemetry:  NSR 90s with PVCs.      Labs: Basic Metabolic Panel:  Recent Labs Lab  09/16/15 0409  09/16/15 2330  09/18/15 [id number] 09/18/15 [id number] 09/19/15 0440 09/20/15 0351 09/21/15 0437 09/22/15 0415  NA 138  < > 136  < > 138 139 138 140 139 140  K 3.7  < > 3.5  < > 3.7 3.5 3.4* 3.0* 3.6 4.1  CL 106  < > 102  < > 103 104 102 104 102 104  CO2 22  --  27  < > 25 26 25 25 27 24   GLUCOSE 105*  < > 99  < > 83 98 93 100* 96 78  BUN 51*  < > 18  < > 35* 37* 51* 62* 42* 51*  CREATININE 6.23*  < > 2.97*  < > 4.79* 4.96* 5.74* 6.72* 4.79* 5.80*  CALCIUM 8.6*  --  7.9*  < > 8.7* 8.8* 8.7* 8.9 8.8* 8.7*  MG  --   --  2.0  --   --   --  2.1  --   --   --   PHOS 3.9  --   --   --  4.8*  --  5.1*  --  4.1 5.4*  < > = values in this interval not displayed.  Liver Function Tests:  Recent Labs Lab 09/18/15 0835 09/19/15 0440 09/20/15 0351 09/21/15 0437 09/22/15 0415  AST 69* 74*  66* 94* 93*  ALT 17 15* 12* 18 15*  ALKPHOS 183* 222* 203* 227* 222*  BILITOT 5.7* 5.2* 5.0* 5.1* 5.5*  PROT 6.9 6.8 6.4* 6.6 6.8  ALBUMIN 1.6* 1.6* 1.8* 1.7* 1.7*   No results for input(s): LIPASE, AMYLASE in the last 168 hours. No results for input(s): AMMONIA in the last 168 hours.  CBC:  Recent Labs Lab 09/17/15 1132  09/18/15 2219 09/19/15 0440 09/20/15 0351 09/21/15 0437 09/22/15 0415  WBC  --   < > 16.1* 16.8* 13.2* 13.8* 13.6*  NEUTROABS 17.3*  --   --   --   --   --   --   HGB  --   < > 8.4* 9.1* 8.1* 9.0* 8.3*  HCT  --   < > 25.8* 27.9* 24.8* 28.3* 26.0*  MCV  --   < > 83.0 81.8 84.1 84.2 81.8  PLT  --   < > 227 234 210 228 189  < > = values in this interval not displayed.  INR:  Recent Labs Lab 09/18/15 0438 09/19/15 0440 09/20/15 0351 09/21/15 0437 09/22/15 0415  INR 1.62* 1.69* 1.65* 1.64* 2.08*    Other results:    Imaging: No results found.   Medications:     Scheduled Medications: . sodium chloride   Intravenous Once  . allopurinol  100 mg Oral Daily  . alteplase  4 mg Intracatheter Once  .  ceFAZolin (ANCEF) IV  1 g Intravenous Q24H  .  citalopram  20 mg Oral Daily  . docusate sodium  200 mg Oral Daily  . famotidine (PEPCID) IV  20 mg Intravenous Q12H  . feeding supplement (PRO-STAT SUGAR FREE 64)  30 mL Per Tube TID  . hydrALAZINE  25 mg Oral Q8H  . magic mouthwash  10 mL Oral TID  . metoCLOPramide (REGLAN) injection  5 mg Intravenous Q6H  . potassium chloride  40 mEq Oral BID  . pregabalin  75 mg Oral Daily  . rifampin (RIFADIN) IVPB  300 mg Intravenous Q12H  . sildenafil  20 mg Oral Q8H  . simethicone  80 mg Oral QID  . sodium chloride flush  10-40 mL Intracatheter Q12H  . tamsulosin  0.4 mg Oral Daily  . vancomycin  125 mg Oral Q12H  . warfarin  4 mg Oral q1800  . Warfarin - Physician Dosing Inpatient   Does not apply q1800    Infusions: . sodium chloride 10 mL/hr at 09/22/15 0800    PRN Medications: sodium chloride, sodium chloride, sodium chloride, sodium chloride, acetaminophen (TYLENOL) oral liquid 160 mg/5 mL, alteplase, bisacodyl **OR** bisacodyl, fentaNYL (SUBLIMAZE) injection, gelatin adsorbable, Gerhardt's butt cream, guaiFENesin, guaiFENesin-dextromethorphan, heparin, heparin, hydrALAZINE, lidocaine (PF), lidocaine-prilocaine, menthol-cetylpyridinium, ondansetron, oxyCODONE, pentafluoroprop-tetrafluoroeth, sodium chloride flush, traMADol   Assessment:   1. LVAD Complication- Driveline abscess 2. MSSA bacteremia -> septic shock 3. Acute/Chronic systolic HF s/p VAD placement 9/16 4. C. Difficile colitis 5. 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 Acute blood loss anemia 8. Severe Malnutrition.   9. AKI - currently requiring HD.  10. Klebsiella (ESBL) pneumonia 11. NSVT  12. Hypokalemia  13. RV Failure  14. Malnutrition 15. Yeast in urine  Plan/Discussion:    S/P I&D LVAD driveline abscess. Exposed hardware. 09/09/15 S/P ABRA and reapplication of ACell. ABR removed 5/30 and wound closed by Dr Prescott Gum. Coumadin restarted, INR 2.    WBC 13.6, stable.  Remains on  cefazolin + rifampin for MSSA bacteremia and  oral vanc for C difficile.  [place] PNA has been treated. Off po rifampin due to poor po intake. Continue IV rifampin. Afebrile over last day.  Urine with >100K yeast.  Discussed with pharmacist treatment, renal function complicates => pharmacy to discuss with ID and will start appropriate antifungal regimen.    Co-ox lower today at 56%, follow for now and repeat tomorrow.    Appetite poor but eating without nausea.   Has RV failure. Continue 20 mg sildenafil tid.   Had been off antihypertensives to allow BP run a bit higher to help with renal recovery.  However, BP quite elevated today.  Will give hydralazine 25 mg every 8 hrs.    Continues with scleral icterus and elevated total bilirubin. LDH stable so doubt hemolysis. Amiodarone on hold.   Renal following. Good UOP, about 1600 cc out again.  Building up volume overload on exam though weight is actually down.  No HD today.  VAD parameters ok.  PT following. Recommending CIR. CIR following.   NSR today.  Off amio for now with elevated LFTs.    Length of Stay: [place]   09/22/2015, 9:33 AM  VAD Team --- VAD ISSUES ONLY--- Pager [phone] (7am - 7am)  Advanced Heart Failure Team  Pager [phone] (M-F; 7a - 4p)  Please contact [facility] for night-coverage after hours (4p -7a ) and weekends on [email or web address]

## 2015-09-22 NOTE — Progress Notes (Signed)
Subjective: Interval History: has no complaint, abdm some better.  Objective: Vital signs in last 24 hours: Temp:  [98.5 F (36.9 C)-102 F (38.9 C)] 98.8 F (37.1 C) (06/04 0353) Pulse Rate:  [53-108] 87 (06/04 0400) Resp:  [9-27] 9 (06/04 0400) BP: (107-133)/(74-95) 119/90 mmHg (06/04 0400) SpO2:  [94 %-100 %] 98 % (06/04 0400) Weight change:   Intake/Output from previous day: 06/03 0701 - 06/04 0700 In: [id number] [P.O.:1280; I.V.:110; IV Piggyback:300] Out: [id number] [Urine:1605] Intake/Output this shift:    General appearance: alert, cooperative and no distress Resp: rales bibasilar Chest wall: [facility] cath Cardio: S1, S2 normal, systolic murmur: holosystolic 2/6, blowing at apex and hum of LVAD GI: mod distension, pos bs, less tight,  driveline Rmid abdm exit, vac midline Extremities: extremities normal, atraumatic, no cyanosis or edema  Lab Results:  Recent Labs  09/21/15 0437 09/22/15 0415  WBC 13.8* 13.6*  HGB 9.0* 8.3*  HCT 28.3* 26.0*  PLT 228 189   BMET:  Recent Labs  09/21/15 0437 09/22/15 0415  NA 139 140  K 3.6 4.1  CL 102 104  CO2 27 24  GLUCOSE 96 78  BUN 42* 51*  CREATININE 4.79* 5.80*  CALCIUM 8.8* 8.7*   No results for input(s): PTH in the last 72 hours. Iron Studies: No results for input(s): IRON, TIBC, TRANSFERRIN, FERRITIN in the last 72 hours.  Studies/Results: No results found.  I have reviewed the patient's current medications.  Assessment/Plan: 1 AKI no function but is putting out salt and water. Acid/base/K ok.  Should improve. 2 Driveline infx on Ancef/Rif, debrided 3 Anemia a little worse 4 Abn LFTs stable 5 ICD 6 Colitis P follow chem, diet, Ab    LOS: 27 days   Erikah Thumm L 09/22/2015,7:53 AM

## 2015-09-22 NOTE — Progress Notes (Signed)
5 Days Post-Op Procedure(s) (LRB): ABDOMINAL WOUND CLOSURE WITH INCISIONAL VAC APPLICATION (N/A) ESOPHAGOGASTRODUODENOSCOPY (EGD) (N/A) Subjective: Abdominal wound examined after temp spike 6-3 102.1 Preveena pad with dried drainage Incision w/o drainage or necrosis  Small area of skin separation w/o drainage, admits tip of culture stick and culture sent to lab Will tranistion to betadine swab and dry dressing every shift  Nutrition remains a problem- panda out His creat remains very elevated He has fungal UTI Objective: Vital signs in last 24 hours: Temp:  [98.5 F (36.9 C)-99.7 F (37.6 C)] 98.8 F (37.1 C) (06/04 0900) Pulse Rate:  [53-96] 91 (06/04 0800) Cardiac Rhythm:  [-] Normal sinus rhythm (06/04 0800) Resp:  [9-27] 14 (06/04 0900) BP: (109-133)/(84-98) 127/98 mmHg (06/04 0800) SpO2:  [94 %-100 %] 98 % (06/04 0800)  Hemodynamic parameters for last 24 hours:  MAP stable  Intake/Output from previous day: 06/03 0701 - 06/04 0700 In: [id number] [P.O.:1280; I.V.:110; IV Piggyback:300] Out: [id number] [Urine:1605] Intake/Output this shift: Total I/O In: 790 [P.O.:250; I.V.:390; IV Piggyback:150] Out: 0        Exam    General- alert and comfortable   Lungs- clear without rales, wheezes   Cor- regular rate and rhythm, no murmur , gallop   Abdomen- soft, non-tender- dressing changed   Extremities - warm, non-tender, minimal edema   Neuro- oriented, appropriate, no focal weakness   Lab Results:  Recent Labs  09/21/15 0437 09/22/15 0415  WBC 13.8* 13.6*  HGB 9.0* 8.3*  HCT 28.3* 26.0*  PLT 228 189   BMET:  Recent Labs  09/21/15 0437 09/22/15 0415  NA 139 140  K 3.6 4.1  CL 102 104  CO2 27 24  GLUCOSE 96 78  BUN 42* 51*  CREATININE 4.79* 5.80*  CALCIUM 8.8* 8.7*    PT/INR:  Recent Labs  09/22/15 0415  LABPROT 23.2*  INR 2.08*   ABG    Component Value Date/Time   PHART 7.332* 09/09/2015 1145   HCO3 20.2 09/09/2015 1145   TCO2 26 09/16/2015 2318   ACIDBASEDEF 5.0* 09/09/2015 1145   O2SAT 55.5 09/22/2015 0434   CBG (last 3)   Recent Labs  09/21/15 1609 09/21/15 2011 09/22/15 0919  GLUCAP 92 86 104*    Assessment/Plan: S/P Procedure(s) (LRB): ABDOMINAL WOUND CLOSURE WITH INCISIONAL VAC APPLICATION (N/A) ESOPHAGOGASTRODUODENOSCOPY (EGD) (N/A) Monitor wound- dressing change BID Follow up wound culture Dietary consult for food preference PT mobility   LOS: 27 days    [name] 09/22/2015

## 2015-09-23 ENCOUNTER — Telehealth: Payer: Self-pay | Admitting: Infectious Diseases

## 2015-09-23 DIAGNOSIS — B3749 Other urogenital candidiasis: Secondary | ICD-10-CM

## 2015-09-23 LAB — LACTATE DEHYDROGENASE: LDH: 280 U/L — AB (ref 98–192)

## 2015-09-23 LAB — COMPREHENSIVE METABOLIC PANEL
ALK PHOS: 226 U/L — AB (ref 38–126)
ALT: 11 U/L — AB (ref 17–63)
AST: 75 U/L — ABNORMAL HIGH (ref 15–41)
Albumin: 1.8 g/dL — ABNORMAL LOW (ref 3.5–5.0)
Anion gap: 12 (ref 5–15)
BILIRUBIN TOTAL: 5 mg/dL — AB (ref 0.3–1.2)
BUN: 55 mg/dL — ABNORMAL HIGH (ref 6–20)
CALCIUM: 8.4 mg/dL — AB (ref 8.9–10.3)
CO2: 22 mmol/L (ref 22–32)
CREATININE: 6.29 mg/dL — AB (ref 0.61–1.24)
Chloride: 106 mmol/L (ref 101–111)
GFR, EST AFRICAN AMERICAN: 11 mL/min — AB (ref >60)
GFR, EST NON AFRICAN AMERICAN: 10 mL/min — AB (ref >60)
Glucose, Bld: 80 mg/dL (ref 65–99)
Potassium: 4.5 mmol/L (ref 3.5–5.1)
Sodium: 140 mmol/L (ref 135–145)
Total Protein: 7 g/dL (ref 6.5–8.1)

## 2015-09-23 LAB — CARBOXYHEMOGLOBIN
Carboxyhemoglobin: 1.6 % — ABNORMAL HIGH (ref 0.5–1.5)
Methemoglobin: 1.1 % (ref 0.0–1.5)
O2 Saturation: 60.6 %
Total hemoglobin: 8.7 g/dL — ABNORMAL LOW (ref 13.5–18.0)

## 2015-09-23 LAB — CBC
HEMATOCRIT: 26.3 % — AB (ref 39.0–52.0)
HEMOGLOBIN: 8.6 g/dL — AB (ref 13.0–17.0)
MCH: 26.8 pg (ref 26.0–34.0)
MCHC: 32.7 g/dL (ref 30.0–36.0)
MCV: 81.9 fL (ref 78.0–100.0)
Platelets: 230 10*3/uL (ref 150–400)
RBC: 3.21 MIL/uL — AB (ref 4.22–5.81)
RDW: 18.9 % — ABNORMAL HIGH (ref 11.5–15.5)
WBC: 12.3 10*3/uL — ABNORMAL HIGH (ref 4.0–10.5)

## 2015-09-23 LAB — PHOSPHORUS: Phosphorus: 6.3 mg/dL — ABNORMAL HIGH (ref 2.5–4.6)

## 2015-09-23 LAB — GLUCOSE, CAPILLARY: GLUCOSE-CAPILLARY: 83 mg/dL (ref 65–99)

## 2015-09-23 LAB — PROTIME-INR
INR: 3.06 — ABNORMAL HIGH (ref 0.00–1.49)
Prothrombin Time: 31.1 seconds — ABNORMAL HIGH (ref 11.6–15.2)

## 2015-09-23 MED ORDER — METOCLOPRAMIDE HCL 10 MG PO TABS
5.0000 mg | ORAL_TABLET | Freq: Four times a day (QID) | ORAL | Status: DC
  Administered 2015-09-23 – 2015-09-25 (×8): 5 mg via ORAL
  Filled 2015-09-23 (×8): qty 1

## 2015-09-23 NOTE — PMR Pre-admission (Signed)
PMR Admission Coordinator Pre-Admission Assessment  Patient: [name] is an 45 y.o., male MRN: [id number] DOB: Sep 02, 1970 Height: '5\' 7"'$  (170.2 cm) Weight: 77.202 kg (170 lb 3.2 oz)              Insurance Information HMO:   PPO:       PCP:       IPA:       80/20:       OTHER:  Choice Plus Plan PRIMARY: [facility]      Policy#: [id number]      Subscriber: [place] Name: Sharlett Iles      Phone#: [phone]     Fax#: Has EPIC access Pre-Cert#: [id number]      Employer: [employer] employed Benefits:  Phone #: [phone]     Name: Laurence Compton. Date: 04/20/09     Deduct: $200      Out of Pocket Max: $2250 (met $886.28)      Life Max: unlimited CIR: 100%      SNF: 100% with 90 days max Outpatient: 60 combined visits     Co-Pay: $10 Home Health: 100% with no visit limits     Co-Pay: none DME: 80%     Co-Pay: 20% Providers: in network  Medicaid Application Date:        Case Manager:   Disability Application Date:        Case Worker:    Emergency Contact Information Contact Information    Name Relation Home Work Mobile   [place] J Brother [phone]    Marquee, Fuchs Sister   [phone]   Peggyann Juba [phone]     Epifanio Lesches [phone]  [phone]     Current Medical History  Patient Admitting Diagnosis: Debility after LVAD drive line abscess  History of Present Illness: A 45 y.o. right handed male with history of systolic congestive heart failure, LVAD 12/20/2014, atrial fibrillation maintained on Coumadin, medical noncompliance. Patient lives alone in [place]. Independent prior to admission. He has a brother in [place]. Recently traveled to [place] to see his mother and developed fever chills and abdominal pain. Went to [facility] in [place]. Initially felt to have pyelonephritis but CT scan negative. Found to have C. difficile colitis placed on oral vancomycin. Developed subxiphoid swelling and subsequent  had a CT which showed large abscess surrounding driveline at LVAD site. Blood cultures showed MSSA. Seen by infectious disease started on nafcillin and transferred to [facility] for further management. Since initial infection was superficial to pump and walled off so pump exchange was not indicated. Patient underwent irrigation and debridement of chest sternal wound 09/09/2015 with closure of abdominal wound 09/18/2015 per Dr. Nils Pyle. TEE showed no vegetation on the valves were ICD wire. Presently remains on cefazolin and rifampin day 30/42 completed. Hospital course ileus and diet slowly advanced with nasogastric tube remaining in place for a short time. Bouts of nausea vomiting felt to be related to antibiotic and rifampin or change to intravenous due to nausea. He remains on twice a day vancomycin for C. difficile through 10/08/2015. Nephrology consulted 09/03/2015 for AKI elevated 1.40-3.89-7.13. Renal ultrasound negative. Hemodialysis initiated. Physical therapy evaluation completed with recommendations to physical medicine rehabilitation consult. Patient to be admitted for a comprehensive inpatient rehabilitation program.  Past Medical History  Past Medical History  Diagnosis Date  . Chronic systolic heart failure (HCC)     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%)  . Atrial fibrillation -parosysmal  Rx w amiodarone  . Noncompliance     H/O  MEDICAL NONCOMPLIANCE  . 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 Tricuspid valve regurgitation     SEVERE  . Severe mitral regurgitation   . Polymorphic ventricular tachycardia (HCC)     RECURRENT WITH APPROPRIATE SHOCK THERAPY IN THE PAST  . Ventricular fibrillation (HCC)     WITH APPROPRIATE SHOCK THERAPY IN THE PAST  . Hypertension   . Gout   . RA (rheumatoid arthritis) ([place])   . Automatic implantable cardiac defibrillator -BSX     single chamber  . GI bleed -massive     11  Units 2012  . Elevated LFTs   . H/O hyperthyroidism   . CHF (congestive heart failure) ([place])   . AKI (acute kidney injury) ([place])   . AICD (automatic cardioverter/defibrillator) present    Family History  family history includes Heart failure in his brother.  Prior Rehab/Hospitalizations: Had [facility] PT after LVAD placed in 09/16  Has the patient had major surgery during 100 days prior to admission? No  Current Medications   Current facility-administered medications:  .  0.9 %  sodium chloride infusion, , Intravenous, Once, Rexene Agent, MD, Stopped at 09/17/15 1100 .  0.9 %  sodium chloride infusion, , Intravenous, Continuous, Effie Berkshire, MD, Stopped at 09/24/15 1500 .  acetaminophen (TYLENOL) solution 500 mg, 500 mg, Oral, Q6H PRN, Rafael Bihari, MD, 500 mg at 09/22/15 2014 .  allopurinol (ZYLOPRIM) tablet 100 mg, 100 mg, Oral, Daily, Jolaine Artist, MD, 100 mg at 09/24/15 1054 .  alteplase (CATHFLO ACTIVASE) injection 4 mg, 4 mg, Intracatheter, Once, Amy D Clegg, NP, 4 mg at 09/18/15 1100 .  bisacodyl (DULCOLAX) EC tablet 10 mg, 10 mg, Oral, Daily PRN **OR** bisacodyl (DULCOLAX) suppository 10 mg, 10 mg, Rectal, Daily PRN, Amy D Clegg, NP .  ceFAZolin (ANCEF) IVPB 2g/100 mL premix, 2 g, Intravenous, Q24H, Larey Dresser, MD, 2 g at 09/24/15 1954 .  citalopram (CELEXA) tablet 20 mg, 20 mg, Oral, Daily, Amy D Clegg, NP, 20 mg at 09/24/15 1054 .  docusate sodium (COLACE) capsule 200 mg, 200 mg, Oral, Daily, Amy D Clegg, NP, Stopped at 09/17/15 1000 .  famotidine (PEPCID) IVPB 20 mg premix, 20 mg, Intravenous, Q24H, Wynelle Fanny, RPH, 20 mg at 09/25/15 0844 .  feeding supplement (NEPRO CARB STEADY) liquid 237 mL, 237 mL, Oral, BID BM, Jolaine Artist, MD, 237 mL at 09/24/15 1100 .  fentaNYL (SUBLIMAZE) injection 25 mcg, 25 mcg, Intravenous, Q4H PRN, Ivin Poot, MD, 25 mcg at 09/20/15 0002 .  fluconazole (DIFLUCAN) IVPB 100 mg, 100 mg, Intravenous, Q24H, Larey Dresser, MD, 100 mg at 09/24/15 1300 .  gelatin adsorbable (GELFOAM/SURGIFOAM) sponge 12-7 mm 2 each, 2 each, Topical, BID PRN, Gaye Pollack, MD .  Gerhardt's butt cream, , Topical, PRN, Ivin Poot, MD .  guaiFENesin (ROBITUSSIN) 100 MG/5ML solution 100 mg, 5 mL, Oral, Q4H PRN, Larey Dresser, MD, 100 mg at 09/24/15 1617 .  heparin injection 1,000 Units, 1,000 Units, Dialysis, PRN, Mauricia Area, MD .  heparin injection 8,300 Units, 100 Units/kg, Dialysis, PRN, Mauricia Area, MD .  hydrALAZINE (APRESOLINE) injection 10 mg, 10 mg, Intravenous, Q4H PRN, Jolaine Artist, MD, 10 mg at 09/22/15 1216 .  hydrALAZINE (APRESOLINE) tablet 25 mg, 25 mg, Oral, Q8H, Larey Dresser, MD, 25 mg at 09/25/15 0530 .  lidocaine (PF) (XYLOCAINE) 1 % injection 5 mL, 5 mL, Intradermal, PRN, Mauricia Area, MD .  lidocaine-prilocaine (EMLA) cream 1 application, 1 application, Topical, PRN, Mauricia Area, MD .  magic mouthwash, 10 mL, Oral, TID, Ivin Poot, MD, 10 mL at 09/22/15 0944 .  menthol-cetylpyridinium (CEPACOL) lozenge 3 mg, 1 lozenge, Oral, PRN, [name], MD .  metoCLOPramide (REGLAN) tablet 5 mg, 5 mg, Oral, Q6H, Jolaine Artist, MD, 5 mg at 09/25/15 0529 .  ondansetron (ZOFRAN) injection 4 mg, 4 mg, Intravenous, Q4H PRN, Amy D Clegg, NP, 4 mg at 09/17/15 1557 .  oxyCODONE (Oxy IR/ROXICODONE) immediate release tablet 5 mg, 5 mg, Oral, Q3H PRN, Ivin Poot, MD, 5 mg at 09/21/15 0819 .  pentafluoroprop-tetrafluoroeth (GEBAUERS) aerosol 1 application, 1 application, Topical, PRN, Mauricia Area, MD .  pregabalin (LYRICA) capsule 75 mg, 75 mg, Oral, Daily, Jolaine Artist, MD, 75 mg at 09/24/15 1053 .  rifampin (RIFADIN) capsule 300 mg, 300 mg, Oral, Q12H, Amy D Clegg, NP, 300 mg at 09/24/15 2158 .  sildenafil (REVATIO) tablet 20 mg, 20 mg, Oral, Q8H, Amy D Clegg, NP, 20 mg at 09/25/15 0530 .  simethicone (MYLICON) chewable tablet 80 mg, 80 mg, Oral, QID, Ivin Poot, MD,  80 mg at 09/24/15 2158 .  sodium chloride flush (NS) 0.9 % injection 10-40 mL, 10-40 mL, Intracatheter, Q12H, Jolaine Artist, MD, 10 mL at 09/24/15 2158 .  sodium chloride flush (NS) 0.9 % injection 10-40 mL, 10-40 mL, Intracatheter, PRN, Jolaine Artist, MD, 30 mL at 09/24/15 0645 .  tamsulosin (FLOMAX) capsule 0.4 mg, 0.4 mg, Oral, Daily, Jolaine Artist, MD, 0.4 mg at 09/24/15 1054 .  traMADol (ULTRAM) tablet 50 mg, 50 mg, Oral, Q6H PRN, Jolaine Artist, MD, 50 mg at 09/20/15 2725 .  vancomycin (VANCOCIN) 50 mg/mL oral solution 125 mg, 125 mg, Oral, Q12H, Carlyle Basques, MD, 125 mg at 09/24/15 2158 .  warfarin (COUMADIN) tablet 4 mg, 4 mg, Oral, ONCE-1800, Gay Filler Carney, RPH .  Warfarin - Pharmacist Dosing Inpatient, , Does not apply, q1800, Pat Patrick, RPH  Patients Current Diet: Diet renal with fluid restriction Fluid restriction:: 1200 mL Fluid; Room service appropriate?: Yes; Fluid consistency:: Thin  Precautions / Restrictions Precautions Precautions: Fall Restrictions Weight Bearing Restrictions: No   Has the patient had 2 or more falls or a fall with injury in the past year?No  Prior Activity Level Community (5-7x/wk): Went out daily.  Worked FT 8 am to 5 pm.  Was driving himself.  Home Assistive Devices / Equipment Home Assistive Devices/Equipment: Blood pressure cuff, Other (Comment) (LVAD supplies) Home Equipment: None  Prior Device Use: Indicate devices/aids used by the patient prior to current illness, exacerbation or injury? None  Prior Functional Level Prior Function Level of Independence: Independent Comments: Driving  Self Care: Did the patient need help bathing, dressing, using the toilet or eating?  Independent  Indoor Mobility: Did the patient need assistance with walking from room to room (with or without device)? Independent  Stairs: Did the patient need assistance with internal or external stairs (with or without device)?  Independent  Functional Cognition: Did the patient need help planning regular tasks such as shopping or remembering to take medications? Independent  Current Functional Level Cognition  Overall Cognitive Status: Impaired/Different from baseline Difficult to assess due to: Level of arousal Current Attention Level: Selective Orientation Level: Oriented X4 General Comments: Pt slow to respond and initiate activity.  He required  min A to load batteries and cotroller into vest.  Made errors, and unable to correct errors without prompting     Extremity Assessment (includes Sensation/Coordination)  Upper Extremity Assessment: Generalized weakness  Lower Extremity Assessment: Defer to PT evaluation    ADLs  Overall ADL's : Needs assistance/impaired Eating/Feeding: Set up, Sitting Grooming: Wash/dry hands, Wash/dry face, Oral care, Min guard, Standing Upper Body Bathing: Minimal assitance, Sitting Lower Body Bathing: Minimal assistance, Sit to/from stand Upper Body Dressing : Moderate assistance, Sitting Upper Body Dressing Details (indicate cue type and reason): vest and front opening gown Lower Body Dressing: Minimal assistance, Sit to/from stand Lower Body Dressing Details (indicate cue type and reason): continues to be able to don socks despite abdominal pain Toilet Transfer: Minimal assistance, Ambulation, Comfort height toilet, Regular Toilet, Grab bars, RW Toilet Transfer Details (indicate cue type and reason): Pt requires assist to steady  Toileting- Clothing Manipulation and Hygiene: Moderate assistance, Sit to/from stand Functional mobility during ADLs: Minimal assistance, Wheelchair General ADL Comments: Pt immediately willing to get OOB with therapists, requesting to go outside. Much brighter affect today.    Mobility  Overal bed mobility: Needs Assistance Bed Mobility: Supine to Sit Rolling: Min guard Sidelying to sit: Min assist, HOB elevated Supine to sit: Min assist Sit  to supine: Min assist General bed mobility comments: requires increased time     Transfers  Overall transfer level: Needs assistance Equipment used: None Transfers: Sit to/from Stand, Stand Pivot Transfers Sit to Stand: Min assist Stand pivot transfers: Min assist General transfer comment: min A to steady.  Pt shaky and unsteady     Ambulation / Gait / Stairs / Wheelchair Mobility  Ambulation/Gait Ambulation/Gait assistance: Museum/gallery curator (Feet): 175 Feet (standing rest break (leaning) x 90 seconds; 175 ft back) Assistive device: None Gait Pattern/deviations: Step-through pattern, Decreased stride length, Drifts right/left, Narrow base of support General Gait Details: decr velocity; drifts R/L especially with head turns (required min assist for balance) Gait velocity: 2.24 ft/sec (age norm 3.72; crossing road 4.4 ft/sec) Gait velocity interpretation: Below normal speed for age/gender    Posture / Balance Dynamic Sitting Balance Sitting balance - Comments: Pt sat 10 minutes EOB with supervision while nsg and therapy cleaned pt and changed gown.  Balance Overall balance assessment: Needs assistance Sitting-balance support: Feet supported Sitting balance-Leahy Scale: Good Sitting balance - Comments: Pt sat 10 minutes EOB with supervision while nsg and therapy cleaned pt and changed gown.  Postural control: Posterior lean Standing balance support: During functional activity, Single extremity supported Standing balance-Leahy Scale: Poor Standing balance comment: reliant on UE support  Standardized Balance Assessment Standardized Balance Assessment : Berg Balance Test, Dynamic Gait Index Berg Balance Test Sit to Stand: Needs minimal aid to stand or to stabilize Standing Unsupported: Able to stand 2 minutes with supervision Sitting with Back Unsupported but Feet Supported on Floor or Stool: Able to sit safely and securely 2 minutes Stand to Sit: Controls descent by  using hands Transfers: Needs one person to assist Standing Unsupported with Eyes Closed: Able to stand 10 seconds with supervision Standing Ubsupported with Feet Together: Needs help to attain position but able to stand for 30 seconds with feet together From Standing, Reach Forward with Outstretched Arm: Can reach forward >12 cm safely (5") From Standing Position, Pick up Object from Floor: Unable to try/needs assist to keep balance (not attempted due to complex abd wound) From Standing Position, Turn to Look Behind Over each Shoulder: Needs supervision  when turning Turn 360 Degrees: Needs close supervision or verbal cueing Standing Unsupported, Alternately Place Feet on Step/Stool: Able to complete >2 steps/needs minimal assist Standing Unsupported, One Foot in Front: Needs help to step but can hold 15 seconds Standing on One Leg: Unable to try or needs assist to prevent fall (grabs for bil UE support as initiating lifting foot) Total Score: 23 Dynamic Gait Index Level Surface: Mild Impairment Change in Gait Speed: Mild Impairment Gait with Horizontal Head Turns: Mild Impairment Gait with Vertical Head Turns: Moderate Impairment Gait and Pivot Turn: Moderate Impairment    Special needs/care consideration BiPAP/CPAP No CPM No Continuous Drip IV KVO Dialysis Yes        Days On intermittent HD Life Vest No Oxygen No Special Bed No Trach Size No Wound Vac (area) No      Skin Has dressings to right chest, abdomen                             Bowel mgmt: Last BM 09/24/15, loose, incontinent Bladder mgmt: Voiding Diabetic mgmt No    Previous Home Environment Living Arrangements: Alone Available Help at Discharge: Family, Available 24 hours/day (siblings will assist) Type of Home: House Home Layout: One level Home Access: Level entry Bathroom Shower/Tub: Chiropodist: Standard Bathroom Accessibility: Yes Home Care Services: No  Discharge Living Setting Plans  for Discharge Living Setting: Alone, House Type of Home at Discharge: House Discharge Home Layout: One level Discharge Home Access: Stairs to enter Entrance Stairs-Number of Steps: 1 Does the patient have any problems obtaining your medications?: No  Social/Family/Support Systems Patient Roles: Other (Comment) (Has a brother, 2 sisters and friends.) Contact Information: Jakim Drapeau - brother [phone] Anticipated Caregiver: Deaundra Kutzer - sister Anticipated Caregiver's Contact Information: Olin Hauser - [phone] Ability/Limitations of Caregiver: Sisters in [facility] likely to come stay with patient after discharge.  He has 2 sisters. Caregiver Availability: Other (Comment) (Can get assistance if needed.) Discharge Plan Discussed with Primary Caregiver: Yes Is Caregiver In Agreement with Plan?: Yes Does Caregiver/Family have Issues with Lodging/Transportation while Pt is in Rehab?: No  Goals/Additional Needs Patient/Family Goal for Rehab: PT/OT mod I goals Expected length of stay: 7 days Cultural Considerations: [employer], attends church Dietary Needs: Renal, 1200 cc fluid restriction per day Equipment Needs: TBD Additional Information: His 2 sisters came from [facility] and stayed with him after LVAD placed last 09/16. Pt/Family Agrees to Admission and willing to participate: Yes Program Orientation Provided & Reviewed with Pt/Caregiver Including Roles  & Responsibilities: Yes  Decrease burden of Care through IP rehab admission: N/A  Possible need for SNF placement upon discharge: Not planned  Patient Condition: This patient's medical and functional status has changed since the consult dated: 09/12/15 in which the Rehabilitation Physician determined and documented that the patient's condition is appropriate for intensive rehabilitative care in an inpatient rehabilitation facility. See "History of Present Illness" (above) for medical update. Functional changes are: Currently requiring min assist  to ambulte 175 feet no device. Patient's medical and functional status update has been discussed with the Rehabilitation physician and patient remains appropriate for inpatient rehabilitation. Will admit to inpatient rehab today.  Preadmission Screen Completed By:  Retta Diones, 09/25/2015 10:47 AM ______________________________________________________________________   Discussed status with Dr. Letta Pate on 09/25/15 at 1046 and received telephone approval for admission today.  Admission Coordinator:  Retta Diones, time1046/Date06/07/17

## 2015-09-23 NOTE — Progress Notes (Signed)
Physical Therapy Treatment Patient Details Name: [name] MRN: [id number] DOB: [birth year] Today's Date: 09/23/2015    History of Present Illness [name] is a 45 y.o. 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 TEE on 5/11 negative 5/22 DEBRIDEMENT AND CLOSURE WOUND WITH PLACEMENT OF ABRA CLOSURE DEVICE. Pt returned to OR for closure of abdomen and incisional VAC placed.    PT Comments    Patient continues to demonstrate decr balance with in room activities and ambulation in hall [facility] Test 23 out of 56; partial DGI below). Required 2 rest breaks throughout 39 minute session (one leaning against wall and one seated). Decr cognition evident today (see below for details). Patient states one of his sisters is planning to come stay with him when he goes home (normally lives alone; uses no DME; works).   Follow Up Recommendations  CIR     Equipment Recommendations  Other (comment) (TBA next venue)    Recommendations for Other Services       Precautions / Restrictions Precautions Precautions: Fall Restrictions Weight Bearing Restrictions: No    Mobility  Bed Mobility                  Transfers Overall transfer level: Needs assistance Equipment used: None Transfers: Sit to/from Stand Sit to Stand: Min assist         General transfer comment: no device, using armrests; assist to steady as achieving upright (posterior sway each time x 3)  Ambulation/Gait Ambulation/Gait assistance: Min assist Ambulation Distance (Feet): 175 Feet (standing rest break (leaning) x 90 seconds; 175 ft back) Assistive device: None Gait Pattern/deviations: Step-through pattern;Decreased stride length;Drifts right/left;Narrow base of support Gait velocity: 2.24 ft/sec (age norm 3.72; crossing road 4.4  ft/sec) Gait velocity interpretation: Below normal speed for age/gender General Gait Details: decr velocity; drifts R/L especially with head turns (required min assist for balance)   Stairs            Wheelchair Mobility    Modified Rankin (Stroke Patients Only)       Balance     09/23/15 1057  Berg Balance Test  Sit to Stand 1  Standing Unsupported 3  Sitting with Back Unsupported but Feet Supported on Floor or Stool 4  Stand to Sit 3  Transfers 1  Standing Unsupported with Eyes Closed 3  Standing Ubsupported with Feet Together 1  From Standing, Reach Forward with Outstretched Arm 3  From Standing Position, Pick up Object from Floor 0 (not attempted due to complex abd wound)  From Standing Position, Turn to Look Behind Over each Shoulder 1  Turn 360 Degrees 1  Standing Unsupported, Alternately Place Feet on Step/Stool 1  Standing Unsupported, One Foot in Front 1  Standing on One Leg 0 (grabs for bil UE support as initiating lifting foot)  Total Score 23     09/23/15 1057  Dynamic Gait Index  Level Surface 2  Change in Gait Speed 2  Gait with Horizontal Head Turns 2  Gait with Vertical Head Turns 1  Gait and Pivot Turn 1   *incomplete DGI   Sitting balance-Leahy Scale: Good       Standing balance-Leahy Scale: Fair                      Cognition Arousal/Alertness: Awake/alert Behavior During Therapy:  WFL for tasks assessed/performed Overall Cognitive Status: Impaired/Different from baseline Area of Impairment: Attention;Memory;Problem solving   Current Attention Level: Selective Memory: Decreased short-term memory       Problem Solving: Slow processing;Difficulty sequencing General Comments: patient reported he had not walked in the past week (last seen by PT 6/1); required prompts to recall event with PT (and it included going OUTSIDE); pt with incr time and difficulty loading batteries and controller into his vest and donning vest  (crossing lines, controller in a too shallow pocket)    Exercises      General Comments General comments (skin integrity, edema, etc.): Educated re: need to walk 3x/day in halls and must have assist. Encouraged pt to self-advocate for this.       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

## 2015-09-23 NOTE — Telephone Encounter (Signed)
Called and spoke with RN at [facility] to inform them of [name]'s continued hospitalization and need to extend his short term disability. Paperwork being faxed to [facility] to provide medical update as his STD was only approved through May 22nd. Copy of completed paperwork will be retained in the patient's chart.     [name], RN [facility] Coordinator   Office: [phone] 24/7 [facility] Pager: [phone]

## 2015-09-23 NOTE — Progress Notes (Signed)
Rehab admissions - Per Dr. Mercy Moore, patient not medically ready for acute inpatient rehab admission today.  I will check back again tomorrow.  Call me for questions.  [phone]

## 2015-09-23 NOTE — H&P (Signed)
Physical Medicine and Rehabilitation Admission H&P    Chief complaint: Weakness   HPI: [name] is a 45 y.o. right handed male with history of systolic congestive heart failure, LVAD 12/20/2014, atrial fibrillation maintained on Coumadin, medical noncompliance. Patient lives alone in [place]. Independent prior to admission. He has a brother in [place]. Recently traveled to [place] to see his mother and developed fever chills and abdominal pain. Went to [facility] in [place]. Initially felt to have pyelonephritis but CT scan negative. Found to have C. difficile colitis placed on oral vancomycin. Developed subxiphoid swelling and subsequent had a CT which showed large abscess surrounding driveline at LVAD site. Blood cultures showed MSSA. Seen by infectious disease started on nafcillin and transferred to [facility] for further management. Since initial infection was superficial to pump and walled off so pump exchange was not indicated.  Patient underwent irrigation and debridement of chest sternal wound 09/09/2015 with closure of abdominal wound 09/18/2015 per Dr. Nils Pyle. TEE showed no vegetation on the valves were ICD wire. Presently remains on cefazolin and rifampin day 30/42 completed. Hospital course ileus and diet slowly advanced with nasogastric tube remaining in place for a short time. Bouts of nausea vomiting felt to be related to antibiotic and rifampin or change to intravenous due to nausea. He remains on twice a day vancomycin for C. difficile through 10/08/2015. Nephrology consulted 09/03/2015 for AKI elevated 1.40-3.89-7.13. Renal ultrasound negative. Hemodialysis initiated. Physical therapy evaluation completed with recommendations to physical medicine rehabilitation consult. Patient was admitted for a comprehensive rehabilitation program  ROS Constitutional: Positive for fever and chills.  HENT: Negative for hearing loss.  Eyes:  Negative for blurred vision and double vision.  Respiratory: Positive for shortness of breath. Negative for cough.  Cardiovascular: Positive for palpitations and leg swelling. Negative for chest pain.  Gastrointestinal: Positive for nausea and constipation. Negative for vomiting.  Musculoskeletal: Positive for myalgias.  Neurological: Positive for weakness. Negative for seizures and headaches.  All other systems reviewed and are negative   Past Medical History  Diagnosis Date  . Chronic systolic heart failure (HCC)     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%)  . Atrial fibrillation -parosysmal      Rx w amiodarone  . Noncompliance     H/O  MEDICAL NONCOMPLIANCE  . 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 Tricuspid valve regurgitation     SEVERE  . Severe mitral regurgitation   . Polymorphic ventricular tachycardia (HCC)     RECURRENT WITH APPROPRIATE SHOCK THERAPY IN THE PAST  . Ventricular fibrillation (HCC)     WITH APPROPRIATE SHOCK THERAPY IN THE PAST  . Hypertension   . Gout   . RA (rheumatoid arthritis) ([place])   . Automatic implantable cardiac defibrillator -BSX     single chamber  . GI bleed -massive     11 Units 2012  . Elevated LFTs   . H/O hyperthyroidism   . CHF (congestive heart failure) ([place])   . AKI (acute kidney injury) ([place])   . AICD (automatic cardioverter/defibrillator) present    Past Surgical History  Procedure Laterality Date  . Cardiac catheterization  06/2006    RIGHT HEART CATH SHOWING SEVERE BIVENTRICUALR CHF WITH MARKED FILLING AND PRESSURES  . Insert / replace / remove pacemaker      GUIDANT HE ICD MODEL 2180, SERIAL # [id number]  . Cholecystectomy    . Implantable cardioverter defibrillator  generator change N/A 07/01/2011    Procedure: IMPLANTABLE CARDIOVERTER DEFIBRILLATOR GENERATOR CHANGE;  Surgeon: Deboraha Sprang, MD;  Location: [facility] CATH LAB;  Service: Cardiovascular;  Laterality: N/A;  . Tee  without cardioversion N/A 12/12/2014    Procedure: TRANSESOPHAGEAL ECHOCARDIOGRAM (TEE);  Surgeon: Jerline Pain, MD;  Location: [facility];  Service: Cardiovascular;  Laterality: N/A;  . Cardiac catheterization N/A 12/14/2014    Procedure: Right Heart Cath;  Surgeon: Jolaine Artist, MD;  Location: [place] CV LAB;  Service: Cardiovascular;  Laterality: N/A;  . Cardiac catheterization N/A 12/14/2014    Procedure: IABP Insertion;  Surgeon: Jolaine Artist, MD;  Location: [place] CV LAB;  Service: Cardiovascular;  Laterality: N/A;  . Insertion of implantable left ventricular assist device N/A 12/20/2014    Procedure: INSERTION OF IMPLANTABLE LEFT VENTRICULAR ASSIST DEVICE;  Surgeon: Ivin Poot, MD;  Location: [place];  Service: Open Heart Surgery;  Laterality: N/A;  CIRC ARREST  NITRIC OXIDE  . Tee without cardioversion N/A 12/20/2014    Procedure: TRANSESOPHAGEAL ECHOCARDIOGRAM (TEE);  Surgeon: Ivin Poot, MD;  Location: [place];  Service: Open Heart Surgery;  Laterality: N/A;  . Sternal wound debridement N/A 08/27/2015    Procedure: WOUND IRRIGATION AND DEBRIDEMENT;  Surgeon: Ivin Poot, MD;  Location: [place];  Service: Thoracic;  Laterality: N/A;  . Application of wound vac N/A 08/27/2015    Procedure: APPLICATION OF WOUND VAC;  Surgeon: Ivin Poot, MD;  Location: [place];  Service: Thoracic;  Laterality: N/A;  . Sternal wound debridement N/A 08/29/2015    Procedure: DEBRIDEMENT OF chest wound;  Surgeon: Ivin Poot, MD;  Location: [place];  Service: Thoracic;  Laterality: N/A;  . Application of wound vac N/A 08/29/2015    Procedure: WOUND VAC CHANGE;  Surgeon: Ivin Poot, MD;  Location: [facility];  Service: Thoracic;  Laterality: N/A;  . Incision and drainage of wound N/A 09/05/2015    Procedure: IRRIGATION AND DEBRIDEMENT sternal WOUND;  Surgeon: Loel Lofty Dillingham, DO;  Location: [facility];  Service: Plastics;  Laterality: N/A;  . Application of a-cell of extremity N/A  09/05/2015    Procedure: APPLICATION OF A-CELL OF sternum;  Surgeon: Loel Lofty Dillingham, DO;  Location: [place];  Service: Plastics;  Laterality: N/A;  . Application of wound vac N/A 09/05/2015    Procedure: APPLICATION OF WOUND VAC to sternum;  Surgeon: Loel Lofty Dillingham, DO;  Location: [facility];  Service: Plastics;  Laterality: N/A;  . Insertion of dialysis catheter Right 09/05/2015    Procedure: INSERTION OF DIALYSIS CATHETER;RIGHT SUBCLAVIAN;  Surgeon: Loel Lofty Dillingham, DO;  Location: [place];  Service: Plastics;  Laterality: Right;  . Sternal closure N/A 09/17/2015    Procedure: ABDOMINAL WOUND CLOSURE WITH INCISIONAL VAC APPLICATION;  Surgeon: Ivin Poot, MD;  Location: [facility];  Service: Thoracic;  Laterality: N/A;  . Esophagogastroduodenoscopy N/A 09/17/2015    Procedure: ESOPHAGOGASTRODUODENOSCOPY (EGD);  Surgeon: Carol Ada, MD;  Location: [place];  Service: Gastroenterology;  Laterality: N/A;  . Pectoralis flap N/A 09/09/2015    Procedure: DEBRIDEMENT AND CLOSURE WOUND WITH PLACEMENT OF ABRA CLOSURE DEVICE;  Surgeon: Loel Lofty Dillingham, DO;  Location: [facility];  Service: Plastics;  Laterality: N/A;   Family History  Problem Relation Age of Onset  . Heart failure Brother    Social History:  reports that he has never smoked. He has never used smokeless tobacco. He reports that he does not drink alcohol or use illicit  drugs. Allergies:  Allergies  Allergen Reactions  . Phytonadione Other (See Comments)    Patient has LVAD: please check with LVAD coordinator on call or LVAD MD on call before reversal of anticoagulation with vit k   Medications Prior to Admission  Medication Sig Dispense Refill  . allopurinol (ZYLOPRIM) 300 MG tablet Take 1 tablet (300 mg total) by mouth daily. 30 tablet 6  . amiodarone (PACERONE) 200 MG tablet Take 0.5 tablets (100 mg total) by mouth daily. 30 tablet 6  . aspirin EC 81 MG tablet Take 81 mg by mouth daily.    . citalopram (CELEXA) 20 MG tablet Take 1  tablet (20 mg total) by mouth daily. 30 tablet 6  . colchicine 0.6 MG tablet Take 1 tablet (0.6 mg total) by mouth 2 (two) times daily as needed (as needed for gout pain). 30 tablet 6  . digoxin (LANOXIN) 0.125 MG tablet Take 1 tablet (0.125 mg total) by mouth daily. 30 tablet 6  . ferrous sulfate 325 (65 FE) MG tablet Take 1 tablet (325 mg total) by mouth 2 (two) times daily with a meal. 60 tablet 3  . furosemide (LASIX) 40 MG tablet Take 40 mg by mouth 2 (two) times a week.  0  . hydrALAZINE (APRESOLINE) 100 MG tablet Take 1 tablet (100 mg total) by mouth every 8 (eight) hours. 90 tablet 3  . lisinopril (PRINIVIL,ZESTRIL) 20 MG tablet Take 1 tablet (20 mg total) by mouth daily. 30 tablet 6  . [EXPIRED] nystatin (MYCOSTATIN) 100000 UNIT/ML suspension Take 5 mLs by mouth 2 (two) times daily.    . pantoprazole (PROTONIX) 40 MG tablet Take 1 tablet (40 mg total) by mouth daily. 30 tablet 6  . potassium chloride SA (K-DUR,KLOR-CON) 20 MEQ tablet Take 40 mEq by mouth 2 (two) times a week.    . pregabalin (LYRICA) 75 MG capsule Take 1 capsule (75 mg total) by mouth 2 (two) times daily. 60 capsule 11  . sildenafil (REVATIO) 20 MG tablet Take 1 tablet (20 mg total) by mouth 3 (three) times daily. 90 tablet 6  . spironolactone (ALDACTONE) 25 MG tablet Take 0.5 tablets (12.5 mg total) by mouth daily. 90 tablet 3  . vancomycin (VANCOCIN) 125 MG capsule Take by mouth.     . warfarin (COUMADIN) 2 MG tablet Take 4 mg daily except 6 mg on Saturdays: may adjust just as needed per [facility] (Patient taking differently: Take 4 mg by mouth daily at 6 PM. ) 90 tablet 6    Home: Home Living Family/patient expects to be discharged to:: Private residence Living Arrangements: Alone Available Help at Discharge: Family, Available 24 hours/day (siblings will assist) Type of Home: House Home Access: Level entry Home Layout: One level Bathroom Shower/Tub: Chiropodist: Standard Bathroom  Accessibility: Yes Home Equipment: None   Functional History: Prior Function Level of Independence: Independent Comments: Driving  Functional Status:  Mobility: Bed Mobility Overal bed mobility: Needs Assistance Bed Mobility: Supine to Sit, Sit to Supine Rolling: Min assist Sidelying to sit: Min assist, HOB elevated Supine to sit: Min assist Sit to supine: Min assist General bed mobility comments: OT arrived with pt seated at EOB with PT Transfers Overall transfer level: Needs assistance Equipment used: Pushed w/c Transfers: Sit to/from Stand Sit to Stand: Min guard, +2 safety/equipment Stand pivot transfers: Min assist, +2 physical assistance, +2 safety/equipment General transfer comment: Verbal cues for hand placement. Assist for safety. Ambulation/Gait Ambulation/Gait assistance: Min guard Ambulation Distance (Feet):  350 Feet Assistive device: Rolling walker (2 wheeled) Gait Pattern/deviations: Step-through pattern, Decreased stride length General Gait Details: Assist for safety. Took one sitting rest break after exiting the room and then able to complete rest of the amb without a break. Gait velocity interpretation: Below normal speed for age/gender    ADL: ADL Overall ADL's : Needs assistance/impaired Eating/Feeding: Set up, Sitting Grooming: Set up, Sitting Upper Body Bathing: Minimal assitance, Sitting Lower Body Bathing: Minimal assistance, Sit to/from stand Upper Body Dressing : Moderate assistance, Sitting Upper Body Dressing Details (indicate cue type and reason): vest and front opening gown Lower Body Dressing: Minimal assistance, Sit to/from stand Lower Body Dressing Details (indicate cue type and reason): continues to be able to don socks despite abdominal pain Toilet Transfer: Minimal assistance, +2 for physical assistance, +2 for safety/equipment, BSC, RW Toilet Transfer Details (indicate cue type and reason): Pt mildly unsteady.  Pt requires +2 for lines  and safety  Toileting- Clothing Manipulation and Hygiene: Moderate assistance, Sit to/from stand Functional mobility during ADLs: +2 for safety/equipment, Minimal assistance (pushing w/c, chair following) General ADL Comments: Pt immediately willing to get OOB with therapists, requesting to go outside. Much brighter affect today.  Cognition: Cognition Overall Cognitive Status: Within Functional Limits for tasks assessed Orientation Level: Oriented X4 Cognition Arousal/Alertness: Awake/alert Behavior During Therapy: WFL for tasks assessed/performed Overall Cognitive Status: Within Functional Limits for tasks assessed Difficult to assess due to: Level of arousal  Physical Exam: Blood pressure 107/81, pulse 96, temperature 98.2 F (36.8 C), temperature source Oral, resp. rate 18, height 5' 7" (1.702 m), weight 82.3 kg (181 lb 7 oz), SpO2 100 %. Physical Exam Constitutional: He is oriented to person, place, and time.  HENT:  Head: Normocephalic.  Eyes: EOM are normal.  Neck: Normal range of motion. Neck supple. No thyromegaly present.  Cardiovascular:  hum  Respiratory:  Decreased breath sounds at the bases  GI:  Mildly distended, nontender. Distant bowel sounds  Neurological: He is alert and oriented to person, place, and time. No cranial nerve deficit. Coordination normal.  UE grossly 4/5 prox to distal. LE: 3/5 HF 4- KE and 4/5 ADF/PF. No sensory findings.  Psychiatric: He has a normal mood and affect  Results for orders placed or performed during the hospital encounter of 08/26/15 (from the past 48 hour(s))  Glucose, capillary     Status: None   Collection Time: 09/21/15 11:39 AM  Result Value Ref Range   Glucose-Capillary 82 65 - 99 mg/dL   Comment 1 Capillary Specimen   Glucose, capillary     Status: None   Collection Time: 09/21/15  4:09 PM  Result Value Ref Range   Glucose-Capillary 92 65 - 99 mg/dL  Glucose, capillary     Status: None   Collection Time: 09/21/15   8:11 PM  Result Value Ref Range   Glucose-Capillary 86 65 - 99 mg/dL   Comment 1 Capillary Specimen   Lactate dehydrogenase     Status: Abnormal   Collection Time: 09/22/15  4:15 AM  Result Value Ref Range   LDH 286 (H) 98 - 192 U/L  Protime-INR     Status: Abnormal   Collection Time: 09/22/15  4:15 AM  Result Value Ref Range   Prothrombin Time 23.2 (H) 11.6 - 15.2 seconds   INR 2.08 (H) 0.00 - 1.49  CBC     Status: Abnormal   Collection Time: 09/22/15  4:15 AM  Result Value Ref Range   WBC 13.6 (H)  4.0 - 10.5 K/uL   RBC 3.18 (L) 4.22 - 5.81 MIL/uL   Hemoglobin 8.3 (L) 13.0 - 17.0 g/dL   HCT 26.0 (L) 39.0 - 52.0 %   MCV 81.8 78.0 - 100.0 fL   MCH 26.1 26.0 - 34.0 pg   MCHC 31.9 30.0 - 36.0 g/dL   RDW 19.1 (H) 11.5 - 15.5 %   Platelets 189 150 - 400 K/uL  Comprehensive metabolic panel     Status: Abnormal   Collection Time: 09/22/15  4:15 AM  Result Value Ref Range   Sodium 140 135 - 145 mmol/L   Potassium 4.1 3.5 - 5.1 mmol/L   Chloride 104 101 - 111 mmol/L   CO2 24 22 - 32 mmol/L   Glucose, Bld 78 65 - 99 mg/dL   BUN 51 (H) 6 - 20 mg/dL   Creatinine, Ser 5.80 (H) 0.61 - 1.24 mg/dL   Calcium 8.7 (L) 8.9 - 10.3 mg/dL   Total Protein 6.8 6.5 - 8.1 g/dL   Albumin 1.7 (L) 3.5 - 5.0 g/dL   AST 93 (H) 15 - 41 U/L   ALT 15 (L) 17 - 63 U/L   Alkaline Phosphatase 222 (H) 38 - 126 U/L   Total Bilirubin 5.5 (H) 0.3 - 1.2 mg/dL   GFR calc non Af Amer 10 (L) >60 mL/min   GFR calc Af Amer 12 (L) >60 mL/min    Comment: (NOTE) The eGFR has been calculated using the CKD EPI equation. This calculation has not been validated in all clinical situations. eGFR's persistently <60 mL/min signify possible Chronic Kidney Disease.    Anion gap 12 5 - 15  Phosphorus     Status: Abnormal   Collection Time: 09/22/15  4:15 AM  Result Value Ref Range   Phosphorus 5.4 (H) 2.5 - 4.6 mg/dL  Carboxyhemoglobin     Status: Abnormal   Collection Time: 09/22/15  4:34 AM  Result Value Ref Range    Total hemoglobin 9.1 (L) 13.5 - 18.0 g/dL   O2 Saturation 55.5 %   Carboxyhemoglobin 2.1 (H) 0.5 - 1.5 %   Methemoglobin 1.1 0.0 - 1.5 %  Glucose, capillary     Status: Abnormal   Collection Time: 09/22/15  9:19 AM  Result Value Ref Range   Glucose-Capillary 104 (H) 65 - 99 mg/dL   Comment 1 Capillary Specimen   Aerobic Culture (superficial specimen) (NOT AT [facility])     Status: None (Preliminary result)   Collection Time: 09/22/15 11:56 AM  Result Value Ref Range   Specimen Description ABDOMEN    Special Requests Normal    Gram Stain      FEW WBC PRESENT,BOTH PMN AND MONONUCLEAR NO ORGANISMS SEEN    Culture PENDING    Report Status PENDING   Glucose, capillary     Status: None   Collection Time: 09/22/15  9:37 PM  Result Value Ref Range   Glucose-Capillary 95 65 - 99 mg/dL   Comment 1 Capillary Specimen   Carboxyhemoglobin     Status: Abnormal   Collection Time: 09/23/15  4:23 AM  Result Value Ref Range   Total hemoglobin 8.7 (L) 13.5 - 18.0 g/dL   O2 Saturation 60.6 %   Carboxyhemoglobin 1.6 (H) 0.5 - 1.5 %   Methemoglobin 1.1 0.0 - 1.5 %  Lactate dehydrogenase     Status: Abnormal   Collection Time: 09/23/15  4:40 AM  Result Value Ref Range   LDH 280 (H) 98 - 192 U/L  Protime-INR     Status: Abnormal   Collection Time: 09/23/15  4:40 AM  Result Value Ref Range   Prothrombin Time 31.1 (H) 11.6 - 15.2 seconds   INR 3.06 (H) 0.00 - 1.49  Phosphorus     Status: Abnormal   Collection Time: 09/23/15  4:40 AM  Result Value Ref Range   Phosphorus 6.3 (H) 2.5 - 4.6 mg/dL  CBC     Status: Abnormal   Collection Time: 09/23/15  4:40 AM  Result Value Ref Range   WBC 12.3 (H) 4.0 - 10.5 K/uL   RBC 3.21 (L) 4.22 - 5.81 MIL/uL   Hemoglobin 8.6 (L) 13.0 - 17.0 g/dL   HCT 26.3 (L) 39.0 - 52.0 %   MCV 81.9 78.0 - 100.0 fL   MCH 26.8 26.0 - 34.0 pg   MCHC 32.7 30.0 - 36.0 g/dL   RDW 18.9 (H) 11.5 - 15.5 %   Platelets 230 150 - 400 K/uL    Comment: PLATELET COUNT CONFIRMED BY  SMEAR  Comprehensive metabolic panel     Status: Abnormal   Collection Time: 09/23/15  4:40 AM  Result Value Ref Range   Sodium 140 135 - 145 mmol/L   Potassium 4.5 3.5 - 5.1 mmol/L   Chloride 106 101 - 111 mmol/L   CO2 22 22 - 32 mmol/L   Glucose, Bld 80 65 - 99 mg/dL   BUN 55 (H) 6 - 20 mg/dL   Creatinine, Ser 6.29 (H) 0.61 - 1.24 mg/dL   Calcium 8.4 (L) 8.9 - 10.3 mg/dL   Total Protein 7.0 6.5 - 8.1 g/dL   Albumin 1.8 (L) 3.5 - 5.0 g/dL   AST 75 (H) 15 - 41 U/L   ALT 11 (L) 17 - 63 U/L   Alkaline Phosphatase 226 (H) 38 - 126 U/L   Total Bilirubin 5.0 (H) 0.3 - 1.2 mg/dL   GFR calc non Af Amer 10 (L) >60 mL/min   GFR calc Af Amer 11 (L) >60 mL/min    Comment: (NOTE) The eGFR has been calculated using the CKD EPI equation. This calculation has not been validated in all clinical situations. eGFR's persistently <60 mL/min signify possible Chronic Kidney Disease.    Anion gap 12 5 - 15  Glucose, capillary     Status: None   Collection Time: 09/23/15  8:29 AM  Result Value Ref Range   Glucose-Capillary 83 65 - 99 mg/dL   Comment 1 Capillary Specimen    No results found.     Medical Problem List and Plan: 1.  Debilitation secondary to C. Difficile/MSSA and abscess associated with LVAD. Status post irrigation debridement of chest sternal wound 09/09/2015 2.  DVT Prophylaxis/Anticoagulation: Coumadin therapy for history of atrial fibrillation. Monitor for any bleeding episodes 3. Pain Management: Lyrica 75 mg daily,Ultram 27m every 6 hour, Oxycodone as needed 4. Mood: Celexa 20 mg daily 5. Neuropsych: This patient is capable of making decisions on his own behalf. 6. Skin/Wound Care: Routine skin checks 7. Fluids/Electrolytes/Nutrition: Routine I&O's with follow-up chemistries 8. ID. LVAD driveline infection/MSSA. Presently day 30/42 Cefazolin and rifampin.Follow up with infectious disease Dr. CLinus Salmons9. Clostridium difficile. Twice a day vancomycin through 10/08/2015. 10.  AKI. Follow-up renal services.No pending plan for dialysis at this time. Follow-up chemistries 11. Hypertension. Hydralazine 25 mg every 8 hours, Revatio 20 mg 3 times a day 12. Ileus. Resolved. Diet advance. Nausea felt to be related to antibiotics. Continue Reglan for now 13. Atrial fibrillation. Cardiac rate controlled.  Continue Coumadin 14. Acute on chronic anemia. Follow-up CBC hemoglobin 8.1-9.2 15.Gout.Monitor for flair up.Continue Zyloprim    Post Admission Physician Evaluation: 1. Functional deficits secondary  to Deconditioning  2. Patient is admitted to receive collaborative, interdisciplinary care between the physiatrist, rehab nursing staff, and therapy team. 3. Patient's level of medical complexity and substantial therapy needs in context of that medical necessity cannot be provided at a lesser intensity of care such as a SNF. 4. Patient has experienced substantial functional loss from his/her baseline which was documented above under the "Functional History" and "Functional Status" headings.  Judging by the patient's diagnosis, physical exam, and functional history, the patient has potential for functional progress which will result in measurable gains while on inpatient rehab.  These gains will be of substantial and practical use upon discharge  in facilitating mobility and self-care at the household level. 5. Physiatrist will provide 24 hour management of medical needs as well as oversight of the therapy plan/treatment and provide guidance as appropriate regarding the interaction of the two. 6. 24 hour rehab nursing will assist with bladder management, bowel management, safety, skin/wound care, disease management, medication administration, pain management and patient education  and help integrate therapy concepts, techniques,education, etc. 7. PT will assess and treat for/with: pre gait, gait training, endurance , safety, equipment, neuromuscular re education.   Goals are: Modified  independent. 8. OT will assess and treat for/with: ADLs, Cognitive perceptual skills, Neuromuscular re education, safety, endurance, equipment.   Goals are: Modified independent. Therapy May proceed with showering this patient. 9. SLP will assess and treat for/with: NA.  Goals are: NA. 10. Case Management and Social Worker will assess and treat for psychological issues and discharge planning. 11. Team conference will be held weekly to assess progress toward goals and to determine barriers to discharge. 12. Patient will receive at least 3 hours of therapy per day at least 5 days per week. 13. ELOS: 7d       14. Prognosis:  excellent     Charlett Blake M.D. [facility] FAAPM&R (Sports Med, Neuromuscular Med) Diplomate Am Board of Electrodiagnostic Med   09/23/2015

## 2015-09-23 NOTE — Progress Notes (Signed)
S: feels well, no new CO O:BP 107/81 mmHg  Pulse 96  Temp(Src) 98.2 F (36.8 C) (Oral)  Resp 18  Ht 5\' 7"  (1.702 m)  Wt 82.3 kg (181 lb 7 oz)  BMI 28.41 kg/m2  SpO2 100%  Intake/Output Summary (Last 24 hours) at 09/23/15 0924 Last data filed at 09/23/15 0825  Gross per 24 hour  Intake    980 ml  Output   2150 ml  Net  -1170 ml   Weight change:  [id number] and alert CVS: Sl tachy, reg Resp: Scattered rhonchi and wheezes Abd: Bandage over LVAD, + BS NTND Ext: tr edema NEURO: CNI Ox3 no asterixis   . sodium chloride   Intravenous Once  . allopurinol  100 mg Oral Daily  . alteplase  4 mg Intracatheter Once  .  ceFAZolin (ANCEF) IV  2 g Intravenous Q24H  . citalopram  20 mg Oral Daily  . docusate sodium  200 mg Oral Daily  . famotidine (PEPCID) IV  20 mg Intravenous Q24H  . feeding supplement (PRO-STAT SUGAR FREE 64)  30 mL Per Tube TID  . fluconazole (DIFLUCAN) IV  100 mg Intravenous Q24H  . hydrALAZINE  25 mg Oral Q8H  . magic mouthwash  10 mL Oral TID  . metoCLOPramide (REGLAN) injection  5 mg Intravenous Q6H  . potassium chloride  40 mEq Oral BID  . pregabalin  75 mg Oral Daily  . rifampin (RIFADIN) IVPB  300 mg Intravenous Q12H  . sildenafil  20 mg Oral Q8H  . simethicone  80 mg Oral QID  . sodium chloride flush  10-40 mL Intracatheter Q12H  . tamsulosin  0.4 mg Oral Daily  . vancomycin  125 mg Oral Q12H  . warfarin  2 mg Oral q1800  . Warfarin - Physician Dosing Inpatient   Does not apply q1800   No results found. BMET    Component Value Date/Time   NA 140 09/23/2015 0440   K 4.5 09/23/2015 0440   CL 106 09/23/2015 0440   CO2 22 09/23/2015 0440   GLUCOSE 80 09/23/2015 0440   BUN 55* 09/23/2015 0440   CREATININE 6.29* 09/23/2015 0440   CREATININE 0.92 02/01/2015 1132   CALCIUM 8.4* 09/23/2015 0440   GFRNONAA 10* 09/23/2015 0440   GFRAA 11* 09/23/2015 0440   CBC    Component Value Date/Time   WBC 12.3* 09/23/2015 0440   RBC 3.21* 09/23/2015 0440   HGB 8.6* 09/23/2015 0440   HCT 26.3* 09/23/2015 0440   PLT 230 09/23/2015 0440   MCV 81.9 09/23/2015 0440   MCH 26.8 09/23/2015 0440   MCHC 32.7 09/23/2015 0440   RDW 18.9* 09/23/2015 0440   LYMPHSABS 1.1 09/17/2015 1132   MONOABS 1.6* 09/17/2015 1132   EOSABS 0.1 09/17/2015 1132   BASOSABS 0.0 09/17/2015 1132     Assessment: 1. ARF, nonoliguric.  Scr not yet improving 2. VAD abscess, MSSA 3. Anemia 4. Severe CHF Plan: 1. UO good Scr sl higher.  He feels well.  If Scr higher tomorrow then will plan HD.  Expect renal fx to recover. 2. DC daily PO4 3. Change to renal diet   Miho Monda T

## 2015-09-23 NOTE — Progress Notes (Signed)
Patient ID: [name], male   DOB: [birth year], 45 y.o.   MRN: [id number]    HeartMate 2 Rounding Note  Subjective:    Admitted from [facility] in [place] with driveline abscess. Blood cultures --> MSSA and has C diff. Remains on cefazolin  and oral vancomycin.   08/27/15 S/P I &D Epigastric with hardware exposed and VAC placement.  5/11 repeat I&D and wound vac replacement.  5/11 TEE no vegetations on valves or ICD wire 5/22 S/P ABRA and Acell  5/30 EGD- normal. Wound Closure.   Overall feeling ok. Denies nausea.   UOP about 1350 cc yesterday.   Urine culture with > 100K yeast. Started on fluconazole.    MAP running 80-90s   VAD INTERROGATION:  HeartMate II LVAD:  Flow 4.5  liters/min, speed 9200, power 5  PI 7.8. No PI events.     Objective:    Vital Signs:   Temp:  [97.9 F (36.6 C)-99.4 F (37.4 C)] 97.9 F (36.6 C) (06/05 0341) Pulse Rate:  [90-96] 96 (06/05 0400) Resp:  [14-20] 20 (06/05 0341) BP: (98-132)/(74-97) 106/94 mmHg (06/05 0400) SpO2:  [98 %-100 %] 100 % (06/05 0400) Last BM Date: 09/22/15 Mean arterial Pressure  80-90s   Intake/Output:   Intake/Output Summary (Last 24 hours) at 09/23/15 0830 Last data filed at 09/23/15 0825  Gross per 24 hour  Intake    980 ml  Output   2150 ml  Net  -1170 ml     Physical Exam: General: In the chair. NAD HEENT: Scleral icterus  Neck: supple. JVP 9-10 cm. Carotids 2+ bilat; no bruits. No lymphadenopathy or thryomegaly appreciated. R subclavian trialysis cath dressing dry Cor: Mechanical heart sounds with LVAD hum present. Lungs: clear on  Abdomen: soft, tender,  + mildly distended. + bowel sounds. Incisional Vac in place.    Driveline: C/D/I; securement device intact and driveline incorporated Extremities: no cyanosis, clubbing, rash, trace edema  Neuro: A & O x3  Telemetry:  NSR 90s with PVCs.      Labs: Basic Metabolic Panel:  Recent Labs Lab 09/16/15 2330  09/18/15 0438   09/19/15 0440 09/20/15 0351 09/21/15 0437 09/22/15 0415 09/23/15 0440  NA 136  < > 138  < > 138 140 139 140 140  K 3.5  < > 3.7  < > 3.4* 3.0* 3.6 4.1 4.5  CL 102  < > 103  < > 102 104 102 104 106  CO2 27  < > 25  < > 25 25 27 24 22   GLUCOSE 99  < > 83  < > 93 100* 96 78 80  BUN 18  < > 35*  < > 51* 62* 42* 51* 55*  CREATININE 2.97*  < > 4.79*  < > 5.74* 6.72* 4.79* 5.80* 6.29*  CALCIUM 7.9*  < > 8.7*  < > 8.7* 8.9 8.8* 8.7* 8.4*  MG 2.0  --   --   --  2.1  --   --   --   --   PHOS  --   --  4.8*  --  5.1*  --  4.1 5.4* 6.3*  < > = values in this interval not displayed.  Liver Function Tests:  Recent Labs Lab 09/19/15 0440 09/20/15 0351 09/21/15 0437 09/22/15 0415 09/23/15 0440  AST 74* 66* 94* 93* 75*  ALT 15* 12* 18 15* 11*  ALKPHOS 222* 203* 227* 222* 226*  BILITOT 5.2* 5.0* 5.1* 5.5* 5.0*  PROT  6.8 6.4* 6.6 6.8 7.0  ALBUMIN 1.6* 1.8* 1.7* 1.7* 1.8*   No results for input(s): LIPASE, AMYLASE in the last 168 hours. No results for input(s): AMMONIA in the last 168 hours.  CBC:  Recent Labs Lab 09/17/15 1132  09/19/15 0440 09/20/15 0351 09/21/15 0437 09/22/15 0415 09/23/15 0440  WBC  --   < > 16.8* 13.2* 13.8* 13.6* 12.3*  NEUTROABS 17.3*  --   --   --   --   --   --   HGB  --   < > 9.1* 8.1* 9.0* 8.3* 8.6*  HCT  --   < > 27.9* 24.8* 28.3* 26.0* 26.3*  MCV  --   < > 81.8 84.1 84.2 81.8 81.9  PLT  --   < > 234 210 228 189 230  < > = values in this interval not displayed.  INR:  Recent Labs Lab 09/19/15 0440 09/20/15 0351 09/21/15 0437 09/22/15 0415 09/23/15 0440  INR 1.69* 1.65* 1.64* 2.08* 3.06*    Other results:    Imaging: No results found.   Medications:     Scheduled Medications: . sodium chloride   Intravenous Once  . allopurinol  100 mg Oral Daily  . alteplase  4 mg Intracatheter Once  .  ceFAZolin (ANCEF) IV  2 g Intravenous Q24H  . citalopram  20 mg Oral Daily  . docusate sodium  200 mg Oral Daily  . famotidine (PEPCID) IV   20 mg Intravenous Q24H  . feeding supplement (PRO-STAT SUGAR FREE 64)  30 mL Per Tube TID  . fluconazole (DIFLUCAN) IV  100 mg Intravenous Q24H  . hydrALAZINE  25 mg Oral Q8H  . magic mouthwash  10 mL Oral TID  . metoCLOPramide (REGLAN) injection  5 mg Intravenous Q6H  . potassium chloride  40 mEq Oral BID  . pregabalin  75 mg Oral Daily  . rifampin (RIFADIN) IVPB  300 mg Intravenous Q12H  . sildenafil  20 mg Oral Q8H  . simethicone  80 mg Oral QID  . sodium chloride flush  10-40 mL Intracatheter Q12H  . tamsulosin  0.4 mg Oral Daily  . vancomycin  125 mg Oral Q12H  . warfarin  2 mg Oral q1800  . Warfarin - Physician Dosing Inpatient   Does not apply q1800    Infusions: . sodium chloride 10 mL/hr at 09/22/15 0800    PRN Medications: sodium chloride, sodium chloride, sodium chloride, sodium chloride, acetaminophen (TYLENOL) oral liquid 160 mg/5 mL, alteplase, bisacodyl **OR** bisacodyl, fentaNYL (SUBLIMAZE) injection, gelatin adsorbable, Gerhardt's butt cream, guaiFENesin, heparin, heparin, hydrALAZINE, lidocaine (PF), lidocaine-prilocaine, menthol-cetylpyridinium, ondansetron, oxyCODONE, pentafluoroprop-tetrafluoroeth, sodium chloride flush, traMADol   Assessment:   1. LVAD Complication- Driveline abscess 2. MSSA bacteremia -> septic shock 3. Acute/Chronic systolic HF s/p VAD placement 9/16 4. C. Difficile colitis 5. 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 Acute blood loss anemia 8. Severe Malnutrition.   9. AKI - currently requiring HD.  10. Klebsiella (ESBL) pneumonia 11. NSVT  12. Hypokalemia  13. RV Failure  14. Malnutrition 15. Yeast in urine  Plan/Discussion:    S/P I&D LVAD driveline abscess. Exposed hardware. 09/09/15 S/P ABRA and reapplication of ACell. ABR removed 5/30 and wound closed by Dr Prescott Gum. Coumadin restarted, INR 3.06. Pharmacy dosing.      WBC 12.3, stable.  Remains on cefazolin + rifampin for MSSA bacteremia and oral  vanc for C difficile.  [place] PNA has been treated. Off po  rifampin due to poor po intake. Continue IV rifampin. Afebrile over last day.  Urine with >100K yeast.  Started on fluconazole per ID.     Co-ox stable.     Has RV failure. Continue 20 mg sildenafil tid.    Maps better  Over the weekend Dr Aundra Dubin started  hydralazine 25 mg every 8 hrs.    Continues with scleral icterus and elevated total bilirubin. LDH stable so doubt hemolysis. Amiodarone on hold.   Renal following. Good UOP, about 1350 out again. Nephrology following.   VAD parameters ok.  PT following. Recommending CIR. CIR following.   NSR today.  Off amio for now with elevated LFTs.    Length of Stay: [place]  NP-C  09/23/2015, 8:30 AM  VAD Team --- VAD ISSUES ONLY--- Pager [phone] (7am - 7am)  Advanced Heart Failure Team  Pager [phone] (M-F; 7a - 4p)  Please contact [facility] for night-coverage after hours (4p -7a ) and weekends on [email or web address]  Patient seen and examined with Darrick Grinder, NP. We discussed all aspects of the encounter. I agree with the assessment and plan as stated above.   Overall much improved. Making much more urine but creatinine still high. Will likely need several more sessions of HD. Wound healing. Dressing as per Dr. Prescott Gum. Remains on abx and fluconazole for funguria. MAPS improved with addition of hydralazine, Will watch BP closely so as not to go too low and hinder renal recovery. Can likely go to CIR by end of week. VAD parameters ok.   Willam Munford,MD 9:54 PM

## 2015-09-23 NOTE — Progress Notes (Signed)
Rehab admissions - We do have approval for acute inpatient rehab admission.  I am waiting to see if patient is medically ready for rehab today.  I do have rehab bed available if he is medically ready.  Call me for questions.  [phone]

## 2015-09-23 NOTE — Progress Notes (Signed)
[facility] for Infectious Disease   Reason for visit: Follow up on drive line infection, MSSA bacteremia.    Interval History: he remains afebrile, WBC stable at 12, no significant diarrhea.  Day 28/42 of IV antibiotics with rifampin.  No n/v.  Creat 6, hyperbilirubinemia.  Started fluconazole for urine culture with yeast.   Literature reviewed regarding drive line infections.    Physical Exam: Constitutional:  Filed Vitals:   09/23/15 0800 09/23/15 0826  BP: 107/81 107/81  Pulse:    Temp:  98.2 F (36.8 C)  Resp:  18   patient appears in NAD; in chair Eyes: icteric HENT: no thrush Respiratory: Normal respiratory effort; CTA B Cardiovascular: LVAD GI: soft, nt, nd  Review of Systems: Constitutional: negative for fevers and chills Gastrointestinal: negative for diarrhea Musculoskeletal: negative for arthralgias  Lab Results  Component Value Date   WBC 12.3* 09/23/2015   HGB 8.6* 09/23/2015   HCT 26.3* 09/23/2015   MCV 81.9 09/23/2015   PLT 230 09/23/2015    Lab Results  Component Value Date   CREATININE 6.29* 09/23/2015   BUN 55* 09/23/2015   NA 140 09/23/2015   K 4.5 09/23/2015   CL 106 09/23/2015   CO2 22 09/23/2015    Lab Results  Component Value Date   ALT 11* 09/23/2015   AST 75* 09/23/2015   ALKPHOS 226* 09/23/2015     Microbiology: Recent Results (from the past 240 hour(s))  C difficile quick scan w PCR reflex     Status: None   Collection Time: 09/15/15  9:25 AM  Result Value Ref Range Status   C Diff antigen NEGATIVE NEGATIVE Final   C Diff toxin NEGATIVE NEGATIVE Final   C Diff interpretation Negative for toxigenic C. difficile  Final  Urine culture     Status: Abnormal   Collection Time: 09/17/15 11:17 AM  Result Value Ref Range Status   Specimen Description URINE, CATHETERIZED  Final   Special Requests NONE  Final   Culture >=100,000 COLONIES/mL YEAST (A)  Final   Report Status 09/18/2015 FINAL  Final  Aerobic Culture  (superficial specimen) (NOT AT [facility])     Status: None   Collection Time: 09/17/15  1:24 PM  Result Value Ref Range Status   Specimen Description WOUND ABDOMEN  Final   Special Requests PATIENT ON FOLLOWING  VANCOMYCIN  Final   Gram Stain   Final    ABUNDANT WBC PRESENT,BOTH PMN AND MONONUCLEAR NO ORGANISMS SEEN    Culture NO GROWTH 2 DAYS  Final   Report Status 09/20/2015 FINAL  Final  Culture, blood (routine x 2)     Status: None   Collection Time: 09/17/15  9:29 PM  Result Value Ref Range Status   Specimen Description BLOOD BLOOD LEFT HAND  Final   Special Requests BOTTLES DRAWN AEROBIC AND ANAEROBIC 5CC  Final   Culture NO GROWTH 5 DAYS  Final   Report Status 09/22/2015 FINAL  Final  Culture, blood (routine x 2)     Status: None   Collection Time: 09/17/15  9:36 PM  Result Value Ref Range Status   Specimen Description BLOOD BLOOD LEFT HAND  Final   Special Requests BOTTLES DRAWN AEROBIC ONLY 5CC  Final   Culture NO GROWTH 5 DAYS  Final   Report Status 09/22/2015 FINAL  Final  Culture, Urine     Status: Abnormal   Collection Time: 09/21/15  9:35 AM  Result Value Ref Range Status   Specimen  Description URINE, RANDOM  Final   Special Requests NONE  Final   Culture >=100,000 COLONIES/mL YEAST (A)  Final   Report Status 09/22/2015 FINAL  Final  Culture, blood (Routine X 2) w Reflex to ID Panel     Status: None (Preliminary result)   Collection Time: 09/21/15  9:40 AM  Result Value Ref Range Status   Specimen Description BLOOD BLOOD LEFT HAND  Final   Special Requests IN PEDIATRIC BOTTLE 3CC  Final   Culture NO GROWTH 1 DAY  Final   Report Status PENDING  Incomplete  Culture, blood (Routine X 2) w Reflex to ID Panel     Status: None (Preliminary result)   Collection Time: 09/21/15  9:50 AM  Result Value Ref Range Status   Specimen Description BLOOD BLOOD LEFT ARM  Final   Special Requests IN PEDIATRIC BOTTLE 3CC  Final   Culture NO GROWTH 1 DAY  Final   Report Status PENDING   Incomplete  Aerobic Culture (superficial specimen) (NOT AT [facility])     Status: None (Preliminary result)   Collection Time: 09/22/15 11:56 AM  Result Value Ref Range Status   Specimen Description ABDOMEN  Final   Special Requests Normal  Final   Gram Stain   Final    FEW WBC PRESENT,BOTH PMN AND MONONUCLEAR NO ORGANISMS SEEN    Culture PENDING  Incomplete   Report Status PENDING  Incomplete    Impression/Plan:  1. LVAD driveline infection - day 28/42 of MSSA antibiotics, most recently with cefazolin and also rifampin.  Initial infection was superficial to pump and walled off so pump exchange was not indicated.   Reviewed literature of drive line infections and nothing new in regards to chronic suppression and no indication for it in the setting of negative TEE, acute superficial abscess, 1st infection.   2. Urine yeast - doubt significance.  Would limit treatment to 3 days through.   tomorrow.   3. C diff - on bid vancomycin and treat with that through 6/20 when he has completed above antibiotics.   4.  AKI - may get dialysis tomorrow

## 2015-09-24 LAB — AEROBIC CULTURE W GRAM STAIN (SUPERFICIAL SPECIMEN)
Culture: NO GROWTH
Special Requests: NORMAL

## 2015-09-24 LAB — CBC
HEMATOCRIT: 28.6 % — AB (ref 39.0–52.0)
Hemoglobin: 9.2 g/dL — ABNORMAL LOW (ref 13.0–17.0)
MCH: 26.9 pg (ref 26.0–34.0)
MCHC: 32.2 g/dL (ref 30.0–36.0)
MCV: 83.6 fL (ref 78.0–100.0)
Platelets: 220 10*3/uL (ref 150–400)
RBC: 3.42 MIL/uL — ABNORMAL LOW (ref 4.22–5.81)
RDW: 19.4 % — AB (ref 11.5–15.5)
WBC: 13.8 10*3/uL — AB (ref 4.0–10.5)

## 2015-09-24 LAB — CARBOXYHEMOGLOBIN
CARBOXYHEMOGLOBIN: 1.5 % (ref 0.5–1.5)
METHEMOGLOBIN: 1.3 % (ref 0.0–1.5)
O2 Saturation: 66 %
Total hemoglobin: 9.2 g/dL — ABNORMAL LOW (ref 13.5–18.0)

## 2015-09-24 LAB — RENAL FUNCTION PANEL
ANION GAP: 14 (ref 5–15)
Albumin: 1.7 g/dL — ABNORMAL LOW (ref 3.5–5.0)
BUN: 59 mg/dL — ABNORMAL HIGH (ref 6–20)
CALCIUM: 8.8 mg/dL — AB (ref 8.9–10.3)
CO2: 21 mmol/L — AB (ref 22–32)
CREATININE: 6.5 mg/dL — AB (ref 0.61–1.24)
Chloride: 106 mmol/L (ref 101–111)
GFR calc Af Amer: 11 mL/min — ABNORMAL LOW (ref >60)
GFR calc non Af Amer: 9 mL/min — ABNORMAL LOW (ref >60)
GLUCOSE: 76 mg/dL (ref 65–99)
Phosphorus: 6.5 mg/dL — ABNORMAL HIGH (ref 2.5–4.6)
Potassium: 5.1 mmol/L (ref 3.5–5.1)
SODIUM: 141 mmol/L (ref 135–145)

## 2015-09-24 LAB — GLUCOSE, CAPILLARY
Glucose-Capillary: 111 mg/dL — ABNORMAL HIGH (ref 65–99)
Glucose-Capillary: 113 mg/dL — ABNORMAL HIGH (ref 65–99)
Glucose-Capillary: 81 mg/dL (ref 65–99)
Glucose-Capillary: 100 mg/dL — ABNORMAL HIGH (ref 65–99)

## 2015-09-24 LAB — LACTATE DEHYDROGENASE: LDH: 276 U/L — ABNORMAL HIGH (ref 98–192)

## 2015-09-24 LAB — PROTIME-INR
INR: 3.18 — AB (ref 0.00–1.49)
Prothrombin Time: 32 seconds — ABNORMAL HIGH (ref 11.6–15.2)

## 2015-09-24 MED ORDER — ALTEPLASE 2 MG IJ SOLR
2.0000 mg | Freq: Once | INTRAMUSCULAR | Status: AC
  Administered 2015-09-24: 2 mg

## 2015-09-24 MED ORDER — NEPRO/CARBSTEADY PO LIQD
237.0000 mL | Freq: Two times a day (BID) | ORAL | Status: DC
  Administered 2015-09-24 – 2015-09-25 (×3): 237 mL via ORAL
  Filled 2015-09-24 (×6): qty 237

## 2015-09-24 MED ORDER — RIFAMPIN 300 MG PO CAPS
300.0000 mg | ORAL_CAPSULE | Freq: Two times a day (BID) | ORAL | Status: DC
  Administered 2015-09-24 – 2015-09-25 (×3): 300 mg via ORAL
  Filled 2015-09-24 (×4): qty 1

## 2015-09-24 NOTE — Progress Notes (Signed)
S: feels well, no N/V O:BP 109/96 mmHg  Pulse 96  Temp(Src) 97.5 F (36.4 C) (Oral)  Resp 15  Ht 5\' 7"  (1.702 m)  Wt 78.7 kg (173 lb 8 oz)  BMI 27.17 kg/m2  SpO2 100%  Intake/Output Summary (Last 24 hours) at 09/24/15 0948 Last data filed at 09/24/15 0800  Gross per 24 hour  Intake   1220 ml  Output   1750 ml  Net   -530 ml   Weight change:  [id number] and alert CVS: RRR Resp: Bibasilar crackles Abd: Bandage over LVAD, + BS NTND Ext: tr edema NEURO: CNI Ox3 no asterixis   . sodium chloride   Intravenous Once  . allopurinol  100 mg Oral Daily  . alteplase  4 mg Intracatheter Once  .  ceFAZolin (ANCEF) IV  2 g Intravenous Q24H  . citalopram  20 mg Oral Daily  . docusate sodium  200 mg Oral Daily  . famotidine (PEPCID) IV  20 mg Intravenous Q24H  . fluconazole (DIFLUCAN) IV  100 mg Intravenous Q24H  . hydrALAZINE  25 mg Oral Q8H  . magic mouthwash  10 mL Oral TID  . metoCLOPramide  5 mg Oral Q6H  . potassium chloride  40 mEq Oral BID  . pregabalin  75 mg Oral Daily  . rifampin  300 mg Oral Q12H  . sildenafil  20 mg Oral Q8H  . simethicone  80 mg Oral QID  . sodium chloride flush  10-40 mL Intracatheter Q12H  . tamsulosin  0.4 mg Oral Daily  . vancomycin  125 mg Oral Q12H   No results found. BMET    Component Value Date/Time   NA 141 09/24/2015 0455   K 5.1 09/24/2015 0455   CL 106 09/24/2015 0455   CO2 21* 09/24/2015 0455   GLUCOSE 76 09/24/2015 0455   BUN 59* 09/24/2015 0455   CREATININE 6.50* 09/24/2015 0455   CREATININE 0.92 02/01/2015 1132   CALCIUM 8.8* 09/24/2015 0455   GFRNONAA 9* 09/24/2015 0455   GFRAA 11* 09/24/2015 0455   CBC    Component Value Date/Time   WBC 12.3* 09/23/2015 0440   RBC 3.21* 09/23/2015 0440   HGB 8.6* 09/23/2015 0440   HCT 26.3* 09/23/2015 0440   PLT 230 09/23/2015 0440   MCV 81.9 09/23/2015 0440   MCH 26.8 09/23/2015 0440   MCHC 32.7 09/23/2015 0440   RDW 18.9* 09/23/2015 0440   LYMPHSABS 1.1 09/17/2015 1132   MONOABS 1.6* 09/17/2015 1132   EOSABS 0.1 09/17/2015 1132   BASOSABS 0.0 09/17/2015 1132     Assessment: 1. ARF, nonoliguric.  Rate of rise of Scr less.  UO excellent 2. VAD abscess, MSSA 3. Anemia 4. Severe CHF Plan: 1.  DC K supp 2.  Will hold off on HD as he feels well and Scr only sl higher as I anticipate renal recovery soon 3. recheck labs in AM  Sherill Wegener T

## 2015-09-24 NOTE — Progress Notes (Signed)
[facility] for Coumadin Indication: LVAD  Allergies  Allergen Reactions  . Phytonadione Other (See Comments)    Patient has LVAD: please check with LVAD coordinator on call or LVAD MD on call before reversal of anticoagulation with vit k    Patient Measurements: Height: 5\' 7"  (170.2 cm) Weight: 173 lb 8 oz (78.7 kg) IBW/kg (Calculated) : 66.1 Heparin Dosing Weight: n/a  Vital Signs: Temp: 97.6 F (36.4 C) (06/06 1226) Temp Source: Oral (06/06 1226) BP: 100/88 mmHg (06/06 1417) Pulse Rate: 101 (06/06 1226)  Labs:  Recent Labs  09/22/15 0415 09/23/15 0440 09/24/15 0455 09/24/15 1400  HGB 8.3* 8.6*  --  9.2*  HCT 26.0* 26.3*  --  28.6*  PLT 189 230  --  220  LABPROT 23.2* 31.1* 32.0*  --   INR 2.08* 3.06* 3.18*  --   CREATININE 5.80* 6.29* 6.50*  --     Estimated Creatinine Clearance: 13.1 mL/min (by C-G formula based on Cr of 6.5).   Medications:  Scheduled:  . sodium chloride   Intravenous Once  . allopurinol  100 mg Oral Daily  . alteplase  4 mg Intracatheter Once  .  ceFAZolin (ANCEF) IV  2 g Intravenous Q24H  . citalopram  20 mg Oral Daily  . docusate sodium  200 mg Oral Daily  . famotidine (PEPCID) IV  20 mg Intravenous Q24H  . feeding supplement (NEPRO CARB STEADY)  237 mL Oral BID BM  . fluconazole (DIFLUCAN) IV  100 mg Intravenous Q24H  . hydrALAZINE  25 mg Oral Q8H  . magic mouthwash  10 mL Oral TID  . metoCLOPramide  5 mg Oral Q6H  . pregabalin  75 mg Oral Daily  . rifampin  300 mg Oral Q12H  . sildenafil  20 mg Oral Q8H  . simethicone  80 mg Oral QID  . sodium chloride flush  10-40 mL Intracatheter Q12H  . tamsulosin  0.4 mg Oral Daily  . vancomycin  125 mg Oral Q12H    Assessment: 45 yo male with LVAD on chronic Coumadin.  Pharmacy now dosing at [place] request.  Patient receiving ancef and rifampin for driveline infection and MSSA bacteremia.  Also on fluconazole for yeast in urine.  INR rising despite  holding doses of Coumadin, as anticipated given drug interactions with both rifampin and fluconazole.  No bleeding or complications noted.  Goal of Therapy:  INR 2-2.5 Monitor platelets by anticoagulation protocol: Yes   Plan:  1. No Coumadin tonight. 2. F/u daily INR  Uvaldo Rising, BCPS  Clinical Pharmacist Pager [phone]  09/24/2015 3:22 PM

## 2015-09-24 NOTE — Progress Notes (Signed)
Occupational Therapy Treatment Patient Details Name: [name] MRN: [id number] DOB: [birth year] Today's Date: 09/24/2015    History of present illness [name] is a 45 y.o. 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 TEE on 5/11 negative 5/22 DEBRIDEMENT AND CLOSURE WOUND WITH PLACEMENT OF ABRA CLOSURE DEVICE. Pt returned to OR for closure of abdomen and incisional VAC placed.   OT comments  Pt requires min A to load batteries and controller into vest - recognizes he made error, but unable to correct without assist.   He is slow to initiate activity and demonstrates slow problem solving.  He fatigued quickly today with OT.  Brushed teeth standing at sink, then ambulated x 100 feet only with one seated rest break - requires min A for balance.   Follow Up Recommendations  CIR;Supervision/Assistance - 24 hour    Equipment Recommendations  None recommended by OT    Recommendations for Other Services      Precautions / Restrictions Precautions Precautions: Fall       Mobility Bed Mobility Overal bed mobility: Needs Assistance Bed Mobility: Supine to Sit Rolling: Min guard         General bed mobility comments: requires increased time   Transfers Overall transfer level: Needs assistance Equipment used: None Transfers: Sit to/from [facility] Sit to Stand: Min assist Stand pivot transfers: Min assist       General transfer comment: min A to steady.  Pt shaky and unsteady     Balance   Sitting-balance support: Feet supported Sitting balance-Leahy Scale: Good     Standing balance support: During functional activity;Single extremity supported Standing balance-Leahy Scale: Poor Standing balance comment: reliant on UE support                    ADL Overall ADL's :  Needs assistance/impaired     Grooming: Wash/dry hands;Wash/dry face;Oral care;Min guard;Standing                   Toilet Transfer: Minimal assistance;Ambulation;Comfort height toilet;Regular Toilet;Grab bars;RW Armed forces technical officer Details (indicate cue type and reason): Pt requires assist to steady          Functional mobility during ADLs: Minimal assistance;Special educational needs teacher   Behavior During Therapy: [facility] for tasks assessed/performed Overall Cognitive Status: Impaired/Different from baseline Area of Impairment: Attention;Problem solving   Current Attention Level: Selective          Problem Solving: Slow processing;Difficulty sequencing;Requires verbal cues General Comments: Pt slow to respond and initiate activity.  He required min A to load batteries and cotroller into vest.  Made errors, and unable to correct errors without prompting     Extremity/Trunk Assessment               Exercises     Shoulder Instructions       General Comments      Pertinent Vitals/ Pain       Pain Assessment: No/denies pain  Home Living  Prior Functioning/Environment              Frequency Min 2X/week     Progress Toward Goals  OT Goals(current goals can now be found in the care plan section)  Progress towards OT goals: Progressing toward goals  ADL Goals Pt Will Perform Grooming: with supervision;standing Pt Will Perform Upper Body Bathing: with set-up;sitting;with supervision Pt Will Perform Lower Body Bathing: with set-up;with supervision;sit to/from stand Pt Will Perform Upper Body Dressing: with set-up;with supervision;sitting Pt Will Perform Lower Body Dressing: with set-up;with supervision;sit to/from stand Pt Will Transfer to Toilet: with set-up;with supervision;ambulating;regular height toilet Pt Will Perform  Toileting - Clothing Manipulation and hygiene: with set-up;with supervision;sit to/from stand Pt/caregiver will Perform Home Exercise Program: Increased strength;Both right and left upper extremity;With theraband Additional ADL Goal #1: Pt will perform management of LVAD equipment and vest independently.  Plan Discharge plan remains appropriate    Co-evaluation                 End of Session Equipment Utilized During Treatment: Other (comment) (w/c )   Activity Tolerance Patient limited by fatigue   Patient Left in chair;with call bell/phone within reach   Nurse Communication Mobility status        Time: 1101-1136 OT Time Calculation (min): 35 min  Charges: OT General Charges $OT Visit: 1 Procedure OT Treatments $Therapeutic Activity: 23-37 mins  [name] M 09/24/2015, 1:31 PM

## 2015-09-24 NOTE — Progress Notes (Addendum)
Nutrition Follow-up  DOCUMENTATION CODES:   Not applicable  INTERVENTION:   -Nepro Shake po BID, each supplement provides 425 kcal and 19 grams protein  NUTRITION DIAGNOSIS:   Inadequate oral intake related to poor appetite as evidenced by  (RN report).  Ongoing  GOAL:   Patient will meet greater than or equal to 90% of their needs  Progressing  MONITOR:   PO intake, Supplement acceptance, Labs, Weight trends, Skin, I & O's  REASON FOR ASSESSMENT:   Consult Assessment of nutrition requirement/status  ASSESSMENT:   45 yo Male admitted for treatment of a sternal / chest wound. The patient is currently in the Unit and recovering from surgery earlier this week. He had an LVAD placed. He had a trip to [place] and came back with concerns of infection at the site of the pump. He was taken to the OR for debridement. There was purulence at the site. Irrigation and debridement was done. Acell and the VAC was placed. He is recovering now. Attempts are being made to see if he is a candidate for transplant. Will need coverage over the pump. Pictures seen and significant amount exposed  Patient s/p procedures 5/22: DEBRIDEMENT AND CLOSURE WOUND WITH PLACEMENT OF ABRA CLOSURE DEVICE  Patient s/p procedure 5/30: CLOSURE OF ABDOMINAL WOUND WITH INCISIONAL VAC APPLICATION   Pt transferred from ICU to SDU on 09/20/15.   Pt underwent BSE on 6/1/7; pt was cleared to start a regular consistency diet with thin liquids.   Cortrak tube fell out on 09/21/15; pt refused replacement. TF was d/c.   Per CTCS, wound vac removed on 09/22/15 and pt transitioned to wet to dry dressings.   Spoke with pt at bedside. He reports that his appetite has been improving over the past few days. Meal completion 10-80% (averaging about 20% completion). Observed breakfast tray; pt consumed 100% of cheerios and 75% of canned peaches. Pt amenable to try nutritional supplements; pt reports he really like Ensure.  Discussed renal friendly options; pt amenable to Nepro shake. Discussed importance of good meal and supplement intake to promote healing.   Nephrology following; last HD on 09/20/15. Per MD notes renal function improving with plan to hold HD today.   Pt has been approved to transfer to CIR once medically stable.  Labs reviewed: Phos: 6.5, CBGS: 111-113.   Diet Order:  Diet renal with fluid restriction Fluid restriction:: 1200 mL Fluid; Room service appropriate?: Yes; Fluid consistency:: Thin  Skin:  Wound (see comment) (closed abdominal incision)  Last BM:  09/24/15  Height:   Ht Readings from Last 1 Encounters:  08/26/15 5\' 7"  (1.702 m)    Weight:   Wt Readings from Last 1 Encounters:  09/24/15 173 lb 8 oz (78.7 kg)    Ideal Body Weight:  67.2 kg  BMI:  Body mass index is 27.17 kg/(m^2).  Estimated Nutritional Needs:   Kcal:  2100-2300  Protein:  120-130 gm  Fluid:  per MD  EDUCATION NEEDS:   No education needs identified at this time  [place] Semrad A. Jimmye Norman, RD, LDN, CDE Pager: [phone] After hours Pager: [phone]

## 2015-09-24 NOTE — Progress Notes (Signed)
[facility]ious [facility]   Reason for visit: Follow up on drive line infection, MSSA bacteremia.    Interval History: he remains afebrile, no WBC today, no significant diarrhea.  Day 29/42 of IV cefazolin with rifampin, now changing that back to po.  No n/v.  Creat 6.      Physical Exam: Constitutional:  Filed Vitals:   09/24/15 0500 09/24/15 0800  BP:  109/96  Pulse:    Temp: 98.1 F (36.7 C) 97.5 F (36.4 C)  Resp: 16 15   patient appears in NAD; in chair Eyes: icteric HENT: no thrush Respiratory: Normal respiratory effort; CTA B Cardiovascular: LVAD GI: soft, nt, nd  Review of Systems: Constitutional: negative for fevers and chills Gastrointestinal: negative for diarrhea Musculoskeletal: negative for arthralgias  Lab Results  Component Value Date   WBC 12.3* 09/23/2015   HGB 8.6* 09/23/2015   HCT 26.3* 09/23/2015   MCV 81.9 09/23/2015   PLT 230 09/23/2015    Lab Results  Component Value Date   CREATININE 6.50* 09/24/2015   BUN 59* 09/24/2015   NA 141 09/24/2015   K 5.1 09/24/2015   CL 106 09/24/2015   CO2 21* 09/24/2015    Lab Results  Component Value Date   ALT 11* 09/23/2015   AST 75* 09/23/2015   ALKPHOS 226* 09/23/2015     Microbiology: Recent Results (from the past 240 hour(s))  C difficile quick scan w PCR reflex     Status: None   Collection Time: 09/15/15  9:25 AM  Result Value Ref Range Status   C Diff antigen NEGATIVE NEGATIVE Final   C Diff toxin NEGATIVE NEGATIVE Final   C Diff interpretation Negative for toxigenic C. difficile  Final  Urine culture     Status: Abnormal   Collection Time: 09/17/15 11:17 AM  Result Value Ref Range Status   Specimen Description URINE, CATHETERIZED  Final   Special Requests NONE  Final   Culture >=100,000 COLONIES/mL YEAST (A)  Final   Report Status 09/18/2015 FINAL  Final  Aerobic Culture (superficial specimen) (NOT AT [facility])     Status: None   Collection Time: 09/17/15  1:24 PM    Result Value Ref Range Status   Specimen Description WOUND ABDOMEN  Final   Special Requests PATIENT ON FOLLOWING  VANCOMYCIN  Final   Gram Stain   Final    ABUNDANT WBC PRESENT,BOTH PMN AND MONONUCLEAR NO ORGANISMS SEEN    Culture NO GROWTH 2 DAYS  Final   Report Status 09/20/2015 FINAL  Final  Culture, blood (routine x 2)     Status: None   Collection Time: 09/17/15  9:29 PM  Result Value Ref Range Status   Specimen Description BLOOD BLOOD LEFT HAND  Final   Special Requests BOTTLES DRAWN AEROBIC AND ANAEROBIC 5CC  Final   Culture NO GROWTH 5 DAYS  Final   Report Status 09/22/2015 FINAL  Final  Culture, blood (routine x 2)     Status: None   Collection Time: 09/17/15  9:36 PM  Result Value Ref Range Status   Specimen Description BLOOD BLOOD LEFT HAND  Final   Special Requests BOTTLES DRAWN AEROBIC ONLY 5CC  Final   Culture NO GROWTH 5 DAYS  Final   Report Status 09/22/2015 FINAL  Final  Culture, Urine     Status: Abnormal   Collection Time: 09/21/15  9:35 AM  Result Value Ref Range Status   Specimen Description URINE, RANDOM  Final  Special Requests NONE  Final   Culture >=100,000 COLONIES/mL YEAST (A)  Final   Report Status 09/22/2015 FINAL  Final  Culture, blood (Routine X 2) w Reflex to ID Panel     Status: None (Preliminary result)   Collection Time: 09/21/15  9:40 AM  Result Value Ref Range Status   Specimen Description BLOOD BLOOD LEFT HAND  Final   Special Requests IN PEDIATRIC BOTTLE 3CC  Final   Culture NO GROWTH 2 DAYS  Final   Report Status PENDING  Incomplete  Culture, blood (Routine X 2) w Reflex to ID Panel     Status: None (Preliminary result)   Collection Time: 09/21/15  9:50 AM  Result Value Ref Range Status   Specimen Description BLOOD BLOOD LEFT ARM  Final   Special Requests IN PEDIATRIC BOTTLE 3CC  Final   Culture NO GROWTH 2 DAYS  Final   Report Status PENDING  Incomplete  Aerobic Culture (superficial specimen) (NOT AT [facility])     Status: None  (Preliminary result)   Collection Time: 09/22/15 11:56 AM  Result Value Ref Range Status   Specimen Description ABDOMEN  Final   Special Requests Normal  Final   Gram Stain   Final    FEW WBC PRESENT,BOTH PMN AND MONONUCLEAR NO ORGANISMS SEEN    Culture NO GROWTH 1 DAY  Final   Report Status PENDING  Incomplete    Impression/Plan:  1. LVAD driveline infection - day 29/42 of MSSA antibiotics, most recently with cefazolin and also rifampin.  Initial infection was superficial to pump and walled off so pump exchange was not indicated.   2. Urine yeast - doubt significance.  Treating for 7 days.  3. C diff - on bid vancomycin and treat with that through 6/20 when he has completed above antibiotics.   4.  AKI - creat higher, may get dialysis today. 5. dispo - to CIR when able.

## 2015-09-24 NOTE — Progress Notes (Signed)
VAD dressing change completed 09/24/2015

## 2015-09-24 NOTE — Progress Notes (Signed)
Patient ID: [name], male   DOB: 10-14-1970, 45 y.o.   MRN: [id number]    HeartMate 2 Rounding Note  Subjective:    Admitted from [facility] in [place] with driveline abscess. Blood cultures --> MSSA and has C diff. Remains on cefazolin  and oral vancomycin.   08/27/15 S/P I &D Epigastric with hardware exposed and VAC placement.  5/11 repeat I&D and wound vac replacement.  5/11 TEE no vegetations on valves or ICD wire 5/22 S/P ABRA and Acell  5/30 EGD- normal. Wound Closure.   Hungry. Overall feeling better. Denies nausea.   UOP about 2500 cc yesterday.   Urine culture with > 100K yeast. Started on fluconazole.    VAD INTERROGATION:  HeartMate II LVAD:  Flow 4.8  liters/min, speed 9200, power 6  PI 7.6. No PI events.     Objective:    Vital Signs:   Temp:  [97.7 F (36.5 C)-98.8 F (37.1 C)] 98.1 F (36.7 C) (06/06 0500) Pulse Rate:  [96-99] 96 (06/05 1617) Resp:  [14-18] 15 (06/06 0800) BP: (98-107)/(75-94) 106/94 mmHg (06/05 2000) SpO2:  [97 %-100 %] 100 % (06/06 0800) Weight:  [173 lb 8 oz (78.7 kg)] 173 lb 8 oz (78.7 kg) (06/06 0500) Last BM Date: 09/24/15 Mean arterial Pressure  80-90s   Intake/Output:   Intake/Output Summary (Last 24 hours) at 09/24/15 0825 Last data filed at 09/24/15 0800  Gross per 24 hour  Intake   1580 ml  Output   1750 ml  Net   -170 ml     Physical Exam: General: In the bed. NAD HEENT: Scleral icterus improving Neck: supple. JVP 6 cm. Carotids 2+ bilat; no bruits. No lymphadenopathy or thryomegaly appreciated. R subclavian trialysis cath dressing dry Cor: Mechanical heart sounds with LVAD hum present. Lungs: clear on  Abdomen: soft, tender, . + bowel sounds. Dressing ok.  Driveline: C/D/I; securement device intact and driveline incorporated Extremities: no cyanosis, clubbing, rash, trace edema  Neuro: A & O x3  Telemetry:  NSR 90s with PVCs.      Labs: Basic Metabolic Panel:  Recent Labs Lab  09/19/15 0440 09/20/15 0351 09/21/15 0437 09/22/15 0415 09/23/15 0440 09/24/15 0455  NA 138 140 139 140 140 141  K 3.4* 3.0* 3.6 4.1 4.5 5.1  CL 102 104 102 104 106 106  CO2 25 25 27 24 22  21*  GLUCOSE 93 100* 96 78 80 76  BUN 51* 62* 42* 51* 55* 59*  CREATININE 5.74* 6.72* 4.79* 5.80* 6.29* 6.50*  CALCIUM 8.7* 8.9 8.8* 8.7* 8.4* 8.8*  MG 2.1  --   --   --   --   --   PHOS 5.1*  --  4.1 5.4* 6.3* 6.5*    Liver Function Tests:  Recent Labs Lab 09/19/15 0440 09/20/15 0351 09/21/15 0437 09/22/15 0415 09/23/15 0440 09/24/15 0455  AST 74* 66* 94* 93* 75*  --   ALT 15* 12* 18 15* 11*  --   ALKPHOS 222* 203* 227* 222* 226*  --   BILITOT 5.2* 5.0* 5.1* 5.5* 5.0*  --   PROT 6.8 6.4* 6.6 6.8 7.0  --   ALBUMIN 1.6* 1.8* 1.7* 1.7* 1.8* 1.7*   No results for input(s): LIPASE, AMYLASE in the last 168 hours. No results for input(s): AMMONIA in the last 168 hours.  CBC:  Recent Labs Lab 09/17/15 1132  09/19/15 0440 09/20/15 0351 09/21/15 0437 09/22/15 0415 09/23/15 0440  WBC  --   < >  16.8* 13.2* 13.8* 13.6* 12.3*  NEUTROABS 17.3*  --   --   --   --   --   --   HGB  --   < > 9.1* 8.1* 9.0* 8.3* 8.6*  HCT  --   < > 27.9* 24.8* 28.3* 26.0* 26.3*  MCV  --   < > 81.8 84.1 84.2 81.8 81.9  PLT  --   < > 234 210 228 189 230  < > = values in this interval not displayed.  INR:  Recent Labs Lab 09/20/15 0351 09/21/15 0437 09/22/15 0415 09/23/15 0440 09/24/15 0455  INR 1.65* 1.64* 2.08* 3.06* 3.18*    Other results:    Imaging: No results found.   Medications:     Scheduled Medications: . sodium chloride   Intravenous Once  . allopurinol  100 mg Oral Daily  . alteplase  4 mg Intracatheter Once  .  ceFAZolin (ANCEF) IV  2 g Intravenous Q24H  . citalopram  20 mg Oral Daily  . docusate sodium  200 mg Oral Daily  . famotidine (PEPCID) IV  20 mg Intravenous Q24H  . fluconazole (DIFLUCAN) IV  100 mg Intravenous Q24H  . hydrALAZINE  25 mg Oral Q8H  . magic  mouthwash  10 mL Oral TID  . metoCLOPramide  5 mg Oral Q6H  . potassium chloride  40 mEq Oral BID  . pregabalin  75 mg Oral Daily  . rifampin (RIFADIN) IVPB  300 mg Intravenous Q12H  . sildenafil  20 mg Oral Q8H  . simethicone  80 mg Oral QID  . sodium chloride flush  10-40 mL Intracatheter Q12H  . tamsulosin  0.4 mg Oral Daily  . vancomycin  125 mg Oral Q12H    Infusions: . sodium chloride 10 mL/hr at 09/24/15 0800    PRN Medications: sodium chloride, sodium chloride, sodium chloride, sodium chloride, acetaminophen (TYLENOL) oral liquid 160 mg/5 mL, alteplase, bisacodyl **OR** bisacodyl, fentaNYL (SUBLIMAZE) injection, gelatin adsorbable, Gerhardt's butt cream, guaiFENesin, heparin, heparin, hydrALAZINE, lidocaine (PF), lidocaine-prilocaine, menthol-cetylpyridinium, ondansetron, oxyCODONE, pentafluoroprop-tetrafluoroeth, sodium chloride flush, traMADol   Assessment:   1. LVAD Complication- Driveline abscess 2. MSSA bacteremia -> septic shock 3. Acute/Chronic systolic HF s/p VAD placement 9/16 4. C. Difficile colitis 5. 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 Acute blood loss anemia 8. Severe Malnutrition.   9. AKI - currently requiring HD.  10. Klebsiella (ESBL) pneumonia 11. NSVT  12. Hypokalemia  13. RV Failure  14. Malnutrition 15. Yeast in urine  Plan/Discussion:    S/P I&D LVAD driveline abscess. Exposed hardware. 09/09/15 S/P ABRA and reapplication of ACell. ABR removed 5/30 and wound closed by Dr Prescott Gum. Coumadin restarted, INR 3.1. He has not received coumadin since 6/4. Pharmacy dosing.      WBC pending. Remains on cefazolin + rifampin for MSSA bacteremia and oral vanc for C difficile.  [place] PNA has been treated. PO intake improved. Stop IV rifampin and start po rifampin.  Afebrile.   Urine with >100K yeast.  Day 2/7 fluconazole per ID.     Co-ox 66%.     Has RV failure. Continue 20 mg sildenafil tid.    Maps better  Over  the weekend Dr Aundra Dubin started  hydralazine 25 mg every 8 hrs. Continue for now.    Continues with scleral icterus and elevated total bilirubin. LDH stable so doubt hemolysis. Amiodarone on hold.   Renal following. Creatinine up to 6.5 Good UOP, about  2500  out again. Nephrology following. When ok with renal can go to CIR.   VAD parameters ok.  PT following. Recommending CIR. CIR following.   NSR today.  Off amio for now with elevated LFTs.   Length of Stay: 64  Amy Clegg  NP-C  09/24/2015, 8:25 AM  VAD Team --- VAD ISSUES ONLY--- Pager [phone] (7am - 7am)  Advanced Heart Failure Team  Pager [phone] (M-F; 7a - 4p)  Please contact [facility] for night-coverage after hours (4p -7a ) and weekends on [email or web address]  Patient seen and examined with Darrick Grinder, NP. We discussed all aspects of the encounter. I agree with the assessment and plan as stated above.   Continues to improve. Suspect he will need HD today and then hopefully can hold. Continue abx and antifungals.   Appetite improving. Continue to ambulate. Wound improving.   VAD parameters stable.   Possibly to CIR later this week.   [name], Daniel,MD 9:53 AM

## 2015-09-25 ENCOUNTER — Inpatient Hospital Stay (HOSPITAL_COMMUNITY)
Admission: RE | Admit: 2015-09-25 | Discharge: 2015-10-02 | DRG: 947 | Disposition: A | Discharge status: Home Health | Payer: 59 | Source: Intra-hospital | Attending: Physical Medicine & Rehabilitation | Admitting: Physical Medicine & Rehabilitation

## 2015-09-25 DIAGNOSIS — B9561 Methicillin susceptible Staphylococcus aureus infection as the cause of diseases classified elsewhere: Secondary | ICD-10-CM | POA: Diagnosis present

## 2015-09-25 DIAGNOSIS — Z7901 Long term (current) use of anticoagulants: Secondary | ICD-10-CM | POA: Diagnosis not present

## 2015-09-25 DIAGNOSIS — T148 Other injury of unspecified body region: Secondary | ICD-10-CM | POA: Diagnosis not present

## 2015-09-25 DIAGNOSIS — I482 Chronic atrial fibrillation, unspecified: Secondary | ICD-10-CM

## 2015-09-25 DIAGNOSIS — I081 Rheumatic disorders of both mitral and tricuspid valves: Secondary | ICD-10-CM | POA: Diagnosis present

## 2015-09-25 DIAGNOSIS — Z7982 Long term (current) use of aspirin: Secondary | ICD-10-CM | POA: Diagnosis not present

## 2015-09-25 DIAGNOSIS — I4891 Unspecified atrial fibrillation: Secondary | ICD-10-CM | POA: Diagnosis present

## 2015-09-25 DIAGNOSIS — I110 Hypertensive heart disease with heart failure: Secondary | ICD-10-CM | POA: Diagnosis present

## 2015-09-25 DIAGNOSIS — N179 Acute kidney failure, unspecified: Secondary | ICD-10-CM | POA: Diagnosis present

## 2015-09-25 DIAGNOSIS — Z95810 Presence of automatic (implantable) cardiac defibrillator: Secondary | ICD-10-CM | POA: Diagnosis not present

## 2015-09-25 DIAGNOSIS — T148XXA Other injury of unspecified body region, initial encounter: Secondary | ICD-10-CM

## 2015-09-25 DIAGNOSIS — Z95811 Presence of heart assist device: Secondary | ICD-10-CM | POA: Diagnosis not present

## 2015-09-25 DIAGNOSIS — Z79899 Other long term (current) drug therapy: Secondary | ICD-10-CM

## 2015-09-25 DIAGNOSIS — A047 Enterocolitis due to Clostridium difficile: Secondary | ICD-10-CM | POA: Diagnosis present

## 2015-09-25 DIAGNOSIS — D62 Acute posthemorrhagic anemia: Secondary | ICD-10-CM | POA: Diagnosis present

## 2015-09-25 DIAGNOSIS — L089 Local infection of the skin and subcutaneous tissue, unspecified: Secondary | ICD-10-CM | POA: Diagnosis not present

## 2015-09-25 DIAGNOSIS — I429 Cardiomyopathy, unspecified: Secondary | ICD-10-CM | POA: Diagnosis present

## 2015-09-25 DIAGNOSIS — M069 Rheumatoid arthritis, unspecified: Secondary | ICD-10-CM | POA: Diagnosis present

## 2015-09-25 DIAGNOSIS — Z8249 Family history of ischemic heart disease and other diseases of the circulatory system: Secondary | ICD-10-CM | POA: Diagnosis not present

## 2015-09-25 DIAGNOSIS — I428 Other cardiomyopathies: Secondary | ICD-10-CM

## 2015-09-25 DIAGNOSIS — A4101 Sepsis due to Methicillin susceptible Staphylococcus aureus: Secondary | ICD-10-CM | POA: Diagnosis present

## 2015-09-25 DIAGNOSIS — M109 Gout, unspecified: Secondary | ICD-10-CM | POA: Diagnosis present

## 2015-09-25 DIAGNOSIS — I5022 Chronic systolic (congestive) heart failure: Secondary | ICD-10-CM | POA: Diagnosis not present

## 2015-09-25 DIAGNOSIS — T827XXA Infection and inflammatory reaction due to other cardiac and vascular devices, implants and grafts, initial encounter: Secondary | ICD-10-CM | POA: Diagnosis present

## 2015-09-25 DIAGNOSIS — I472 Ventricular tachycardia, unspecified: Secondary | ICD-10-CM

## 2015-09-25 DIAGNOSIS — Y831 Surgical operation with implant of artificial internal device as the cause of abnormal reaction of the patient, or of later complication, without mention of misadventure at the time of the procedure: Secondary | ICD-10-CM | POA: Diagnosis present

## 2015-09-25 DIAGNOSIS — R7881 Bacteremia: Secondary | ICD-10-CM | POA: Diagnosis not present

## 2015-09-25 DIAGNOSIS — R5381 Other malaise: Secondary | ICD-10-CM | POA: Diagnosis not present

## 2015-09-25 DIAGNOSIS — Z888 Allergy status to other drugs, medicaments and biological substances status: Secondary | ICD-10-CM

## 2015-09-25 DIAGNOSIS — I5023 Acute on chronic systolic (congestive) heart failure: Secondary | ICD-10-CM | POA: Diagnosis not present

## 2015-09-25 LAB — CBC
HCT: 24.9 % — ABNORMAL LOW (ref 39.0–52.0)
HCT: 27.6 % — ABNORMAL LOW (ref 39.0–52.0)
HEMOGLOBIN: 7.9 g/dL — AB (ref 13.0–17.0)
HEMOGLOBIN: 8.8 g/dL — AB (ref 13.0–17.0)
MCH: 26.2 pg (ref 26.0–34.0)
MCH: 26.5 pg (ref 26.0–34.0)
MCHC: 31.7 g/dL (ref 30.0–36.0)
MCHC: 31.9 g/dL (ref 30.0–36.0)
MCV: 82.5 fL (ref 78.0–100.0)
MCV: 83.1 fL (ref 78.0–100.0)
Platelets: 187 10*3/uL (ref 150–400)
Platelets: 225 10*3/uL (ref 150–400)
RBC: 3.02 MIL/uL — AB (ref 4.22–5.81)
RBC: 3.32 MIL/uL — ABNORMAL LOW (ref 4.22–5.81)
RDW: 19 % — ABNORMAL HIGH (ref 11.5–15.5)
RDW: 18.7 % — ABNORMAL HIGH (ref 11.5–15.5)
WBC: 10.7 10*3/uL — ABNORMAL HIGH (ref 4.0–10.5)
WBC: 11.2 10*3/uL — ABNORMAL HIGH (ref 4.0–10.5)

## 2015-09-25 LAB — LACTATE DEHYDROGENASE: LDH: 285 U/L — ABNORMAL HIGH (ref 98–192)

## 2015-09-25 LAB — PROTIME-INR
INR: 2.33 — AB (ref 0.00–1.49)
Prothrombin Time: 25.3 seconds — ABNORMAL HIGH (ref 11.6–15.2)

## 2015-09-25 LAB — RENAL FUNCTION PANEL
ANION GAP: 13 (ref 5–15)
Albumin: 1.7 g/dL — ABNORMAL LOW (ref 3.5–5.0)
BUN: 58 mg/dL — ABNORMAL HIGH (ref 6–20)
CHLORIDE: 106 mmol/L (ref 101–111)
CO2: 20 mmol/L — AB (ref 22–32)
Calcium: 8.7 mg/dL — ABNORMAL LOW (ref 8.9–10.3)
Creatinine, Ser: 6.63 mg/dL — ABNORMAL HIGH (ref 0.61–1.24)
GFR calc non Af Amer: 9 mL/min — ABNORMAL LOW (ref >60)
GFR, EST AFRICAN AMERICAN: 10 mL/min — AB (ref >60)
GLUCOSE: 74 mg/dL (ref 65–99)
Phosphorus: 6.7 mg/dL — ABNORMAL HIGH (ref 2.5–4.6)
Potassium: 5.1 mmol/L (ref 3.5–5.1)
SODIUM: 139 mmol/L (ref 135–145)

## 2015-09-25 LAB — CARBOXYHEMOGLOBIN
CARBOXYHEMOGLOBIN: 1.6 % — AB (ref 0.5–1.5)
METHEMOGLOBIN: 1.4 % (ref 0.0–1.5)
O2 SAT: 70.2 %
Total hemoglobin: 8.9 g/dL — ABNORMAL LOW (ref 13.5–18.0)

## 2015-09-25 LAB — GLUCOSE, CAPILLARY: GLUCOSE-CAPILLARY: 74 mg/dL (ref 65–99)

## 2015-09-25 MED ORDER — FLUCONAZOLE IN SODIUM CHLORIDE 100-0.9 MG/50ML-% IV SOLN
100.0000 mg | INTRAVENOUS | Status: AC
  Administered 2015-09-26 – 2015-09-28 (×3): 100 mg via INTRAVENOUS
  Filled 2015-09-25 (×8): qty 50

## 2015-09-25 MED ORDER — VANCOMYCIN 50 MG/ML ORAL SOLUTION
125.0000 mg | Freq: Two times a day (BID) | ORAL | Status: DC
  Administered 2015-09-25 – 2015-10-02 (×14): 125 mg via ORAL
  Filled 2015-09-25 (×15): qty 2.5

## 2015-09-25 MED ORDER — OXYCODONE HCL 5 MG PO TABS
5.0000 mg | ORAL_TABLET | ORAL | Status: DC | PRN
  Administered 2015-09-28 – 2015-09-30 (×6): 5 mg via ORAL
  Filled 2015-09-25 (×6): qty 1

## 2015-09-25 MED ORDER — NEPRO/CARBSTEADY PO LIQD
237.0000 mL | Freq: Two times a day (BID) | ORAL | Status: DC
  Administered 2015-09-26 – 2015-09-28 (×3): 237 mL via ORAL

## 2015-09-25 MED ORDER — VANCOMYCIN 50 MG/ML ORAL SOLUTION: 125.0000 mg | Freq: Two times a day (BID) | ORAL | Status: DC

## 2015-09-25 MED ORDER — PREGABALIN 75 MG PO CAPS: 75.0000 mg | ORAL_CAPSULE | Freq: Every day | ORAL | Status: DC

## 2015-09-25 MED ORDER — RIFAMPIN 300 MG PO CAPS: 300.0000 mg | ORAL_CAPSULE | Freq: Two times a day (BID) | ORAL | Status: DC

## 2015-09-25 MED ORDER — ALLOPURINOL 100 MG PO TABS
100.0000 mg | ORAL_TABLET | Freq: Every day | ORAL | Status: DC
  Administered 2015-09-26 – 2015-10-02 (×7): 100 mg via ORAL
  Filled 2015-09-25 (×6): qty 1

## 2015-09-25 MED ORDER — ONDANSETRON HCL 4 MG PO TABS: 4.0000 mg | ORAL_TABLET | Freq: Four times a day (QID) | ORAL | Status: DC | PRN

## 2015-09-25 MED ORDER — ACETAMINOPHEN 325 MG PO TABS: 325.0000 mg | ORAL_TABLET | ORAL | Status: DC | PRN

## 2015-09-25 MED ORDER — ONDANSETRON HCL 4 MG/2ML IJ SOLN: 4.0000 mg | INTRAMUSCULAR | Status: DC | PRN

## 2015-09-25 MED ORDER — SIMETHICONE 80 MG PO CHEW: 80.0000 mg | CHEWABLE_TABLET | Freq: Four times a day (QID) | ORAL | Status: DC

## 2015-09-25 MED ORDER — GUAIFENESIN 100 MG/5ML PO SOLN: 5.0000 mL | ORAL | Status: DC | PRN

## 2015-09-25 MED ORDER — CEFAZOLIN SODIUM-DEXTROSE 2-4 GM/100ML-% IV SOLN: 2.0000 g | INTRAVENOUS | Status: AC

## 2015-09-25 MED ORDER — GERHARDT'S BUTT CREAM: TOPICAL_CREAM | CUTANEOUS | Status: DC | PRN

## 2015-09-25 MED ORDER — WARFARIN SODIUM 4 MG PO TABS
4.0000 mg | ORAL_TABLET | Freq: Once | ORAL | Status: AC
  Administered 2015-09-25: 4 mg via ORAL
  Filled 2015-09-25: qty 1

## 2015-09-25 MED ORDER — WARFARIN - PHARMACIST DOSING INPATIENT: Freq: Every day | Status: DC

## 2015-09-25 MED ORDER — TRAMADOL HCL 50 MG PO TABS: 50.0000 mg | ORAL_TABLET | Freq: Four times a day (QID) | ORAL | Status: DC | PRN

## 2015-09-25 MED ORDER — HYDRALAZINE HCL 25 MG PO TABS: 25.0000 mg | ORAL_TABLET | Freq: Three times a day (TID) | ORAL | Status: DC

## 2015-09-25 MED ORDER — ACETAMINOPHEN 160 MG/5ML PO SOLN: 500.0000 mg | Freq: Four times a day (QID) | ORAL | Status: DC | PRN

## 2015-09-25 MED ORDER — TAMSULOSIN HCL 0.4 MG PO CAPS
0.4000 mg | ORAL_CAPSULE | Freq: Every day | ORAL | Status: DC
Start: 2015-09-26 — End: 2015-10-02
  Administered 2015-09-26 – 2015-10-02 (×7): 0.4 mg via ORAL
  Filled 2015-09-25 (×8): qty 1

## 2015-09-25 MED ORDER — SIMETHICONE 80 MG PO CHEW
80.0000 mg | CHEWABLE_TABLET | Freq: Four times a day (QID) | ORAL | Status: DC
Start: 2015-09-25 — End: 2015-10-02
  Administered 2015-09-25 – 2015-10-02 (×26): 80 mg via ORAL
  Filled 2015-09-25 (×27): qty 1

## 2015-09-25 MED ORDER — PREGABALIN 75 MG PO CAPS
75.0000 mg | ORAL_CAPSULE | Freq: Every day | ORAL | Status: DC
  Administered 2015-09-26 – 2015-10-02 (×7): 75 mg via ORAL
  Filled 2015-09-25 (×7): qty 1

## 2015-09-25 MED ORDER — METOCLOPRAMIDE HCL 5 MG PO TABS
5.0000 mg | ORAL_TABLET | Freq: Four times a day (QID) | ORAL | Status: DC
  Administered 2015-09-25 – 2015-10-02 (×28): 5 mg via ORAL
  Filled 2015-09-25 (×28): qty 1

## 2015-09-25 MED ORDER — MENTHOL 3 MG MT LOZG: 1.0000 | LOZENGE | OROMUCOSAL | Status: DC | PRN

## 2015-09-25 MED ORDER — HYDRALAZINE HCL 25 MG PO TABS
25.0000 mg | ORAL_TABLET | Freq: Three times a day (TID) | ORAL | Status: DC
  Administered 2015-09-25 – 2015-10-02 (×21): 25 mg via ORAL
  Filled 2015-09-25 (×21): qty 1

## 2015-09-25 MED ORDER — DOCUSATE SODIUM 100 MG PO CAPS: 200.0000 mg | ORAL_CAPSULE | Freq: Every day | ORAL | Status: DC

## 2015-09-25 MED ORDER — LIDOCAINE HCL (PF) 1 % IJ SOLN: 5.0000 mL | INTRAMUSCULAR | Status: DC | PRN

## 2015-09-25 MED ORDER — WARFARIN SODIUM 4 MG PO TABS: 4.0000 mg | ORAL_TABLET | Freq: Once | ORAL | Status: DC

## 2015-09-25 MED ORDER — ALLOPURINOL 100 MG PO TABS: 100.0000 mg | ORAL_TABLET | Freq: Every day | ORAL | Status: DC

## 2015-09-25 MED ORDER — SORBITOL 70 % SOLN: 30.0000 mL | Freq: Every day | Status: DC | PRN

## 2015-09-25 MED ORDER — TAMSULOSIN HCL 0.4 MG PO CAPS: 0.4000 mg | ORAL_CAPSULE | Freq: Every day | ORAL | Status: DC

## 2015-09-25 MED ORDER — WARFARIN SODIUM 2 MG PO TABS: 4.0000 mg | ORAL_TABLET | Freq: Once | ORAL | Status: DC

## 2015-09-25 MED ORDER — BISACODYL 5 MG PO TBEC: 10.0000 mg | DELAYED_RELEASE_TABLET | Freq: Every day | ORAL | Status: DC | PRN

## 2015-09-25 MED ORDER — RIFAMPIN 300 MG PO CAPS
300.0000 mg | ORAL_CAPSULE | Freq: Two times a day (BID) | ORAL | Status: DC
  Administered 2015-09-25 – 2015-10-02 (×14): 300 mg via ORAL
  Filled 2015-09-25 (×15): qty 1

## 2015-09-25 MED ORDER — CEFAZOLIN SODIUM-DEXTROSE 2-4 GM/100ML-% IV SOLN
2.0000 g | INTRAVENOUS | Status: DC
  Administered 2015-09-25 – 2015-10-01 (×7): 2 g via INTRAVENOUS
  Filled 2015-09-25 (×9): qty 100

## 2015-09-25 MED ORDER — ONDANSETRON HCL 4 MG/2ML IJ SOLN: 4.0000 mg | Freq: Four times a day (QID) | INTRAMUSCULAR | Status: DC | PRN

## 2015-09-25 MED ORDER — CITALOPRAM HYDROBROMIDE 20 MG PO TABS
20.0000 mg | ORAL_TABLET | Freq: Every day | ORAL | Status: DC
  Administered 2015-09-26 – 2015-10-02 (×7): 20 mg via ORAL
  Filled 2015-09-25 (×7): qty 1

## 2015-09-25 MED ORDER — SODIUM CHLORIDE 0.9% FLUSH
10.0000 mL | INTRAVENOUS | Status: DC | PRN
Start: 2015-09-25 — End: 2015-10-02
  Administered 2015-09-25 – 2015-09-27 (×3): 10 mL
  Administered 2015-09-28: 30 mL
  Administered 2015-09-28 (×2): 10 mL
  Administered 2015-09-29 – 2015-10-02 (×3): 30 mL
  Filled 2015-09-25 (×9): qty 40

## 2015-09-25 MED ORDER — FAMOTIDINE IN NACL 20-0.9 MG/50ML-% IV SOLN: 20.0000 mg | INTRAVENOUS | Status: DC

## 2015-09-25 MED ORDER — OXYCODONE HCL 5 MG PO TABS: 5.0000 mg | ORAL_TABLET | ORAL | Status: DC | PRN

## 2015-09-25 MED ORDER — SILDENAFIL CITRATE 20 MG PO TABS
20.0000 mg | ORAL_TABLET | Freq: Three times a day (TID) | ORAL | Status: DC
  Administered 2015-09-25 – 2015-10-02 (×21): 20 mg via ORAL
  Filled 2015-09-25 (×22): qty 1

## 2015-09-25 MED ORDER — LIDOCAINE-PRILOCAINE 2.5-2.5 % EX CREA: 1.0000 "application " | TOPICAL_CREAM | CUTANEOUS | Status: DC | PRN

## 2015-09-25 MED ORDER — FLUCONAZOLE 100 MG PO TABS
100.0000 mg | ORAL_TABLET | Freq: Every day | ORAL | Status: DC
Start: 2015-09-25 — End: 2015-10-02

## 2015-09-25 MED ORDER — DOCUSATE SODIUM 100 MG PO CAPS
200.0000 mg | ORAL_CAPSULE | Freq: Every day | ORAL | Status: DC
  Administered 2015-09-26 – 2015-10-02 (×4): 200 mg via ORAL
  Filled 2015-09-25 (×7): qty 2

## 2015-09-25 MED ORDER — NEPRO/CARBSTEADY PO LIQD: 237.0000 mL | Freq: Two times a day (BID) | ORAL | Status: DC

## 2015-09-25 NOTE — Progress Notes (Signed)
Physical Therapy Treatment Patient Details Name: [name] MRN: [id number] DOB: [birth year] Today's Date: 09/25/2015    History of Present Illness [name] is a 45 y.o. 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 TEE on 5/11 negative 5/22 DEBRIDEMENT AND CLOSURE WOUND WITH PLACEMENT OF ABRA CLOSURE DEVICE. Pt returned to OR for closure of abdomen and incisional VAC placed.    PT Comments    Patient continues to improve, however continues to be unsteady and require external assist to prevent falls. He is eager to move onto CIR and increase his activity and ultimately return home.   Follow Up Recommendations  CIR     Equipment Recommendations   (TBA)    Recommendations for Other Services       Precautions / Restrictions Precautions Precautions: Fall Restrictions Weight Bearing Restrictions: No    Mobility  Bed Mobility Overal bed mobility: Needs Assistance Bed Mobility: Supine to Sit     Supine to sit: Min guard;HOB elevated        Transfers Overall transfer level: Needs assistance Equipment used: None Transfers: Sit to/from Stand Sit to Stand: Min guard         General transfer comment: shakey/unsteady however regained balance without external assist  Ambulation/Gait Ambulation/Gait assistance: Min assist Ambulation Distance (Feet): 150 Feet Assistive device: None Gait Pattern/deviations: Step-through pattern;Decreased stride length;Staggering left;Drifts right/left;Narrow base of support Gait velocity: remains slow and little ability to increase Gait velocity interpretation: Below normal speed for age/gender General Gait Details: decr velocity; drifts R/L especially with head turns and distrations in hall   Stairs            Wheelchair Mobility    Modified Rankin  (Stroke Patients Only)       Balance                                    Cognition Arousal/Alertness: Awake/alert Behavior During Therapy: Flat affect (more flat/down today (not wanting to discuss)) Overall Cognitive Status: Impaired/Different from baseline Area of Impairment: Problem solving             Problem Solving: Slow processing;Difficulty sequencing General Comments: Better sequencing with donning vest and batteries--continued to need incr time and difficulty with spacial awareness (which way to don vest and not pull on driveline). He was able to self-correct with only questioning cues.    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[employer]) Time For Goal Achievement: 09/26/15 Progress towards PT goals: Progressing toward goals    Frequency  Min 3X/week    PT Plan Current plan remains appropriate    Co-evaluation             End of Session Equipment Utilized During Treatment: Gait belt Activity Tolerance: Patient limited by  fatigue (seated rest x 2) Patient left: in chair;with call bell/phone within reach     Time: [id number] PT Time Calculation (min) (ACUTE ONLY): 40 min  Charges:  $Gait Training: 8-22 mins $Therapeutic Activity: 23-37 mins                    G Codes:      Dorna Mallet 13-Oct-2015, 11:28 AM Pager [phone]

## 2015-09-25 NOTE — Discharge Summary (Signed)
Advanced Heart Failure Team  Discharge Summary   Patient ID: [name] MRN: [id number], DOB/AGE: 09/24/1970 45 y.o. Admit date: 08/26/2015 D/C date:     09/25/2015    Primary Discharge Diagnoses:  1. LVAD Complication- Driveline abscess 2. MSSA bacteremia -> septic shock 3. Acute/Chronic systolic HF s/p VAD placement 9/16 4. C. Difficile colitis 5. 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 Acute blood loss anemia 8. Severe Malnutrition.  9. AKI - currently requiring HD.  10. Klebsiella (ESBL) pneumonia 11. NSVT  12. Hypokalemia  13. RV Failure  14. Malnutrition 15. Yeast in urine  Hospital Course:   [name] is a 45 y/o male with systolic HF due to NICM (EF 20-25%) s/p [employer] ICD, chronic AF and gout who underwent HM II VAD implantation on 12/20/14. Hospital course complicated by RV failure and was discharged on milrinone which was eventually weaned off.  Recently travelled to [facility] to see his mother who was dying from a long illness. While there developed fevers and chills and abdominal pain. Went to [facility] in [place], [place]. Initially felt to have pyelonephritis but CT scan negative. Found to have C. Diff colitis. Started on oral vanc. He developed subxiphoid swelling and subsequently had repeat CT which showed large abscess surrounding driveline. Bcx 2/2 MSSA. Seen by ID and started on Nafcillin and transferred here for management of abscess and possible pump exchange.  Admitted from [facility] in [place] with LVAD driveline abscess. Hospitalization was complicated by septic shock, MSSA bacteremia, CDiff, and AKI. Prior to admit blood cultures from [facility] showed MSSA and C diff. Cardiac Surgery and ID consulted on admit.   He was taken to the operating room on May 9th for I and D of epigastric wound. Exposed LVAD hardware was noted. He returned to the OR May 11th for I and D and wound VAC placement. He also had a TEE  that did not show any vegetation on valves or ICD wires.  VAC later removed due to inability to seal. At that point he had sterile dressing changes. Plastic Surgery was consulted for complex wound with exposed hardware. On May 22nd he returned to the OR with ABRA wound closure device and application of ACell. Over the next several days ABRA device was tightened. On May 30th he returned to the OR for closure of abdominal wound. Over the course of his hospitalization he required multiple blood products due to symptomatic anemia and blood loss from abdominal wound.   ID followed closely through out his hospitalization. He was placed on broad spectrum antibiotics with adjustments made based on blood cultures. He was being treated for MSSA bacteremia, C diff, and klebseilla PNA. Plan to continue Ancef, oral Vanc, and Rifampin through June 20th. Urine with >100K yeast and will continue fluconazole June 10th.   Due to septic shock he developed AKI with MAPs down to the 40s. Initially requiring pressors and eventually weaned off. Nephrology was consulted and started CVVHD and later transitioned to intermittent HD. Gradually he started making urine and on the day of discharge he was urinating over 2 liters a day. Nephrology will follow on CIR to monitor for further dialysis.    On May 28th GI consulted for N/V/hematemeis.Had EGD on May 30th that ok. Nutrition followed closely and due to severe malnutrition. He did require short term tube feeds until his appetite improved. Speech therapy consulted with normal oropharyngeal function. Nausea improved after he was switched to IV  rifampin. Once po intake improved he was switched back to oral rifampin.  He continue on PPI.   Due to severe deconditioning PT consulted with recommendations for CIR. On June7th he was transferred to CIR in stable condition. HF team will continue to follow daily. He will continue on coumadin with INR goal  2-2.5.   Consults:  Cardiac  Surgery Plastic Surgery ID Nephrology Gastroenterology Speech Therapy, PT      VAD INTERROGATION:  HeartMate II LVAD: Flow 4.8 liters/min, speed 9200, power 5 PI 7.8. No PI events.   Discharge Weight: 170 pounds.  Discharge Vitals: Blood pressure 110/88, pulse 83, temperature 98.1 F (36.7 C), temperature source Oral, resp. rate 16, height 5\' 7"  (1.702 m), weight 170 lb 3.2 oz (77.202 kg), SpO2 100 %.  Labs: Lab Results  Component Value Date   WBC 10.7* 09/25/2015   HGB 7.9* 09/25/2015   HCT 24.9* 09/25/2015   MCV 82.5 09/25/2015   PLT 187 09/25/2015    Recent Labs Lab 09/23/15 0440  09/25/15 0515  NA 140  < > 139  K 4.5  < > 5.1  CL 106  < > 106  CO2 22  < > 20*  BUN 55*  < > 58*  CREATININE 6.29*  < > 6.63*  CALCIUM 8.4*  < > 8.7*  PROT 7.0  --   --   BILITOT 5.0*  --   --   ALKPHOS 226*  --   --   ALT 11*  --   --   AST 75*  --   --   GLUCOSE 80  < > 74  < > = values in this interval not displayed. Lab Results  Component Value Date   CHOL 111 12/16/2014   HDL 36* 12/16/2014   LDLCALC 62 12/16/2014   TRIG 136 09/03/2015   BNP (last 3 results)  Recent Labs  12/21/14 0309 12/27/14 0047 01/14/15 1046  BNP 347.0* 781.0* 162.9*    ProBNP (last 3 results)  Recent Labs  12/10/14 1214  PROBNP 1807.0*     Diagnostic Studies/Procedures   No results found.  Discharge Medications     Medication List    STOP taking these medications        allopurinol 300 MG tablet  Commonly known as:  ZYLOPRIM  Replaced by:  allopurinol 100 MG tablet     amiodarone 200 MG tablet  Commonly known as:  PACERONE     aspirin EC 81 MG tablet     colchicine 0.6 MG tablet     digoxin 0.125 MG tablet  Commonly known as:  LANOXIN     furosemide 40 MG tablet  Commonly known as:  LASIX     lisinopril 20 MG tablet  Commonly known as:  PRINIVIL,ZESTRIL     nystatin 100000 UNIT/ML suspension  Commonly known as:  MYCOSTATIN     pantoprazole 40 MG  tablet  Commonly known as:  PROTONIX     potassium chloride SA 20 MEQ tablet  Commonly known as:  K-DUR,KLOR-CON     spironolactone 25 MG tablet  Commonly known as:  ALDACTONE      TAKE these medications        acetaminophen 160 MG/5ML solution  Commonly known as:  TYLENOL  Take 15.6 mLs (500 mg total) by mouth every 6 (six) hours as needed for fever.     allopurinol 100 MG tablet  Commonly known as:  ZYLOPRIM  Take 1 tablet (100 mg total) by  mouth daily.     bisacodyl 5 MG EC tablet  Commonly known as:  DULCOLAX  Take 2 tablets (10 mg total) by mouth daily as needed for moderate constipation (Give daily if no BM).     ceFAZolin 2-4 GM/100ML-% IVPB  Commonly known as:  ANCEF  Inject 100 mLs (2 g total) into the vein daily.     citalopram 20 MG tablet  Commonly known as:  CELEXA  Take 1 tablet (20 mg total) by mouth daily.     docusate sodium 100 MG capsule  Commonly known as:  COLACE  Take 2 capsules (200 mg total) by mouth daily.     famotidine 20-0.9 MG/50ML-%  Commonly known as:  PEPCID  Inject 50 mLs (20 mg total) into the vein daily.     feeding supplement (NEPRO CARB STEADY) Liqd  Take 237 mLs by mouth 2 (two) times daily between meals.     ferrous sulfate 325 (65 FE) MG tablet  Take 1 tablet (325 mg total) by mouth 2 (two) times daily with a meal.     fluconazole 100 MG tablet  Commonly known as:  DIFLUCAN  Take 1 tablet (100 mg total) by mouth daily.     guaiFENesin 100 MG/5ML Soln  Commonly known as:  ROBITUSSIN  Take 5 mLs (100 mg total) by mouth every 4 (four) hours as needed for cough or to loosen phlegm.     hydrALAZINE 25 MG tablet  Commonly known as:  APRESOLINE  Take 1 tablet (25 mg total) by mouth every 8 (eight) hours.     lidocaine (PF) 1 % Soln injection  Commonly known as:  XYLOCAINE  Inject 5 mLs into the skin as needed (topical anesthesia for hemodialysis ifGEBAUERS is ineffective.).     lidocaine-prilocaine cream  Commonly known  as:  EMLA  Apply 1 application topically as needed (topical anesthesia for hemodialysis if Gebauers and Lidocaine injection are ineffective.).     menthol-cetylpyridinium 3 MG lozenge  Commonly known as:  CEPACOL  Take 1 lozenge (3 mg total) by mouth as needed for sore throat.     ondansetron 4 MG/2ML Soln injection  Commonly known as:  ZOFRAN  Inject 2 mLs (4 mg total) into the vein every 4 (four) hours as needed for nausea or vomiting.     oxyCODONE 5 MG immediate release tablet  Commonly known as:  Oxy IR/ROXICODONE  Take 1 tablet (5 mg total) by mouth every 3 (three) hours as needed for moderate pain or breakthrough pain.     pregabalin 75 MG capsule  Commonly known as:  LYRICA  Take 1 capsule (75 mg total) by mouth daily.     rifampin 300 MG capsule  Commonly known as:  RIFADIN  Take 1 capsule (300 mg total) by mouth every 12 (twelve) hours.     sildenafil 20 MG tablet  Commonly known as:  REVATIO  Take 1 tablet (20 mg total) by mouth 3 (three) times daily.     simethicone 80 MG chewable tablet  Commonly known as:  MYLICON  Chew 1 tablet (80 mg total) by mouth 4 (four) times daily.     tamsulosin 0.4 MG Caps capsule  Commonly known as:  FLOMAX  Take 1 capsule (0.4 mg total) by mouth daily.     vancomycin 50 mg/mL oral solution  Commonly known as:  VANCOCIN  Take 2.5 mLs (125 mg total) by mouth every 12 (twelve) hours.     warfarin 4 MG  tablet  Commonly known as:  COUMADIN  Take 1 tablet (4 mg total) by mouth one time only at 6 PM.        Disposition   The patient will be discharged in stable condition to home.     Discharge Instructions    Diet - low sodium heart healthy    Complete by:  As directed      Heart Failure patients record your daily weight using the same scale at the same time of day    Complete by:  As directed      Increase activity slowly    Complete by:  As directed               Duration of Discharge Encounter: Greater than 35  minutes   Signed, Amy Clegg NP-C  09/25/2015, 10:58 AM   Patient seen and examined with Darrick Grinder, NP. We discussed all aspects of the encounter. I agree with the assessment and plan as stated above.   He is much improved. Can go to CIR for rehab. Will need to follow wound and renal function closely. VAD parameters currently stable.   [name], Daniel,MD 10:58 PM

## 2015-09-25 NOTE — Progress Notes (Signed)
Meredith Staggers, MD Physician Signed Physical Medicine and Rehabilitation Consult Note 09/12/2015 7:27 AM  Related encounter: Admission (Current) from 08/26/2015 in [facility] Collapse All        Physical Medicine and Rehabilitation Consult Reason for Consult: Debilitation/MSSA/history of LVAD Referring Physician: Dr. Haroldine Laws   HPI: [name] is a 45 y.o. right handed male with history of systolic congestive heart failure, LVAD 12/20/2014, atrial fibrillation maintained on Coumadin, medical noncompliance. Patient lives alone in [place]. Independent prior to admission. He has a brother in [place]. Recently traveled to [place] to see his mother and developed fever chills and abdominal pain. Went to [facility] in [place]. Initially felt to have pyelonephritis but CT scan negative. Found to have C. difficile colitis placed on oral vancomycin. Developed subxiphoid swelling and subsequent had a CT which showed large abscess surrounding driveline at LVAD site. Blood cultures showed MSSA. Seen by infectious disease started on nafcillin and transferred to [facility] for further management. Await plans for drainage of abscess probable pump exchange. Followed by infectious disease await plan for possible TEE. Patient underwent irrigation and debridement of chest sternal wound 09/09/2015. Hospital course ileus and diet slowly advanced with nasogastric tube remaining in place. Nephrology consulted 09/03/2015 for AKI elevated 1.40-3.89-7.13. Renal ultrasound negative. We plan on dialysis. Physical therapy evaluation completed with recommendations to physical medicine rehabilitation consult.   Review of Systems  Constitutional: Positive for fever and chills.  HENT: Negative for hearing loss.  Eyes: Negative for blurred vision and double vision.  Respiratory: Positive for shortness of breath. Negative for  cough.  Cardiovascular: Positive for palpitations and leg swelling. Negative for chest pain.  Gastrointestinal: Positive for nausea and constipation. Negative for vomiting.  Musculoskeletal: Positive for myalgias.  Neurological: Positive for weakness. Negative for seizures and headaches.  All other systems reviewed and are negative.  Past Medical History  Diagnosis Date  . Chronic systolic heart failure (HCC)     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%)  . Atrial fibrillation -parosysmal     Rx w amiodarone  . Noncompliance     H/O MEDICAL NONCOMPLIANCE  . 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 Tricuspid valve regurgitation     SEVERE  . Severe mitral regurgitation   . Polymorphic ventricular tachycardia (HCC)     RECURRENT WITH APPROPRIATE SHOCK THERAPY IN THE PAST  . Ventricular fibrillation (HCC)     WITH APPROPRIATE SHOCK THERAPY IN THE PAST  . Hypertension   . Gout   . RA (rheumatoid arthritis) ([place])   . Automatic implantable cardiac defibrillator -BSX     single chamber  . GI bleed -massive     11 Units 2012  . Elevated LFTs   . H/O hyperthyroidism   . CHF (congestive heart failure) ([place])   . AKI (acute kidney injury) ([place])   . AICD (automatic cardioverter/defibrillator) present    Past Surgical History  Procedure Laterality Date  . Cardiac catheterization  06/2006    RIGHT HEART CATH SHOWING SEVERE BIVENTRICUALR CHF WITH MARKED FILLING AND PRESSURES  . Insert / replace / remove pacemaker      GUIDANT HE ICD MODEL 2180, SERIAL # [id number]  . Cholecystectomy    . Implantable cardioverter defibrillator generator change N/A 07/01/2011    Procedure: IMPLANTABLE CARDIOVERTER DEFIBRILLATOR GENERATOR CHANGE; Surgeon: Deboraha Sprang, MD; Location: [facility] CATH LAB; Service:  Cardiovascular; Laterality: N/A;  . Tee  without cardioversion N/A 12/12/2014    Procedure: TRANSESOPHAGEAL ECHOCARDIOGRAM (TEE); Surgeon: Jerline Pain, MD; Location: [place]; Service: Cardiovascular; Laterality: N/A;  . Cardiac catheterization N/A 12/14/2014    Procedure: Right Heart Cath; Surgeon: Jolaine Artist, MD; Location: [place] CV LAB; Service: Cardiovascular; Laterality: N/A;  . Cardiac catheterization N/A 12/14/2014    Procedure: IABP Insertion; Surgeon: Jolaine Artist, MD; Location: [place] CV LAB; Service: Cardiovascular; Laterality: N/A;  . Insertion of implantable left ventricular assist device N/A 12/20/2014    Procedure: INSERTION OF IMPLANTABLE LEFT VENTRICULAR ASSIST DEVICE; Surgeon: Ivin Poot, MD; Location: [place]; Service: Open Heart Surgery; Laterality: N/A; CIRC ARREST  NITRIC OXIDE  . Tee without cardioversion N/A 12/20/2014    Procedure: TRANSESOPHAGEAL ECHOCARDIOGRAM (TEE); Surgeon: Ivin Poot, MD; Location: [place]; Service: Open Heart Surgery; Laterality: N/A;  . Sternal wound debridement N/A 08/27/2015    Procedure: WOUND IRRIGATION AND DEBRIDEMENT; Surgeon: Ivin Poot, MD; Location: [place]; Service: Thoracic; Laterality: N/A;  . Application of wound vac N/A 08/27/2015    Procedure: APPLICATION OF WOUND VAC; Surgeon: Ivin Poot, MD; Location: [place]; Service: Thoracic; Laterality: N/A;  . Sternal wound debridement N/A 08/29/2015    Procedure: DEBRIDEMENT OF chest wound; Surgeon: Ivin Poot, MD; Location: [place]; Service: Thoracic; Laterality: N/A;  . Application of wound vac N/A 08/29/2015    Procedure: WOUND VAC CHANGE; Surgeon: Ivin Poot, MD; Location: [place]; Service: Thoracic; Laterality: N/A;  . Incision and drainage of wound N/A 09/05/2015    Procedure: IRRIGATION AND DEBRIDEMENT sternal WOUND; Surgeon: Loel Lofty Dillingham, DO; Location: [place]; Service: Plastics;  Laterality: N/A;  . Application of a-cell of extremity N/A 09/05/2015    Procedure: APPLICATION OF A-CELL OF sternum; Surgeon: Loel Lofty Dillingham, DO; Location: [place]; Service: Plastics; Laterality: N/A;  . Application of wound vac N/A 09/05/2015    Procedure: APPLICATION OF WOUND VAC to sternum; Surgeon: Loel Lofty Dillingham, DO; Location: [place]; Service: Plastics; Laterality: N/A;  . Insertion of dialysis catheter Right 09/05/2015    Procedure: INSERTION OF DIALYSIS CATHETER;RIGHT SUBCLAVIAN; Surgeon: Loel Lofty Dillingham, DO; Location: [place]; Service: Plastics; Laterality: Right;  . Pectoralis flap N/A 09/09/2015    Procedure: DEBRIDEMENT AND CLOSURE WOUND WITH PLACEMENT OF ABRA CLOSURE DEVICE; Surgeon: Loel Lofty Dillingham, DO; Location: [place]; Service: Plastics; Laterality: N/A;   Family History  Problem Relation Age of Onset  . Heart failure Brother    Social History:  reports that he has never smoked. He has never used smokeless tobacco. He reports that he does not drink alcohol or use illicit drugs. Allergies:  Allergies  Allergen Reactions  . Phytonadione Other (See Comments)    Patient has LVAD: please check with LVAD coordinator on call or LVAD MD on call before reversal of anticoagulation with vit k   Medications Prior to Admission  Medication Sig Dispense Refill  . allopurinol (ZYLOPRIM) 300 MG tablet Take 1 tablet (300 mg total) by mouth daily. 30 tablet 6  . amiodarone (PACERONE) 200 MG tablet Take 0.5 tablets (100 mg total) by mouth daily. 30 tablet 6  . aspirin EC 81 MG tablet Take 81 mg by mouth daily.    . citalopram (CELEXA) 20 MG tablet Take 1 tablet (20 mg total) by mouth daily. 30 tablet 6  . colchicine 0.6 MG tablet Take 1 tablet (0.6 mg total) by mouth 2 (two) times daily as needed (as needed  for gout pain). 30 tablet 6  . digoxin (LANOXIN) 0.125 MG tablet Take 1 tablet (0.125  mg total) by mouth daily. 30 tablet 6  . ferrous sulfate 325 (65 FE) MG tablet Take 1 tablet (325 mg total) by mouth 2 (two) times daily with a meal. 60 tablet 3  . furosemide (LASIX) 40 MG tablet Take 40 mg by mouth 2 (two) times a week.  0  . hydrALAZINE (APRESOLINE) 100 MG tablet Take 1 tablet (100 mg total) by mouth every 8 (eight) hours. 90 tablet 3  . lisinopril (PRINIVIL,ZESTRIL) 20 MG tablet Take 1 tablet (20 mg total) by mouth daily. 30 tablet 6  . [EXPIRED] nystatin (MYCOSTATIN) 100000 UNIT/ML suspension Take 5 mLs by mouth 2 (two) times daily.    . pantoprazole (PROTONIX) 40 MG tablet Take 1 tablet (40 mg total) by mouth daily. 30 tablet 6  . potassium chloride SA (K-DUR,KLOR-CON) 20 MEQ tablet Take 40 mEq by mouth 2 (two) times a week.    . pregabalin (LYRICA) 75 MG capsule Take 1 capsule (75 mg total) by mouth 2 (two) times daily. 60 capsule 11  . sildenafil (REVATIO) 20 MG tablet Take 1 tablet (20 mg total) by mouth 3 (three) times daily. 90 tablet 6  . spironolactone (ALDACTONE) 25 MG tablet Take 0.5 tablets (12.5 mg total) by mouth daily. 90 tablet 3  . vancomycin (VANCOCIN) 125 MG capsule Take by mouth.     . warfarin (COUMADIN) 2 MG tablet Take 4 mg daily except 6 mg on Saturdays: may adjust just as needed per [facility] (Patient taking differently: Take 4 mg by mouth daily at 6 PM. ) 90 tablet 6    Home: Home Living Family/patient expects to be discharged to:: Private residence Living Arrangements: Alone Available Help at Discharge: Family, Available 24 hours/day (sister and brother are coming to assist) Type of Home: House Home Access: Level entry Home Layout: One level Bathroom Shower/Tub: Chiropodist: Standard Bathroom Accessibility: Yes Home Equipment: None  Functional History: Prior Function Level of Independence: Independent Comments: Driving Functional Status:   Mobility: Bed Mobility Overal bed mobility: Needs Assistance, +2 for physical assistance Bed Mobility: Rolling, Sidelying to Sit Rolling: Mod assist Sidelying to sit: Mod assist, +2 for safety/equipment, HOB elevated Supine to sit: Mod assist, +2 for physical assistance General bed mobility comments: rolling to protect incision; pt with good effort to push to sitting Transfers Overall transfer level: Needs assistance Equipment used: Rolling walker (2 wheeled) Transfers: Sit to/from Stand Sit to Stand: Min assist, +2 physical assistance, +2 safety/equipment Stand pivot transfers: +2 physical assistance, +2 safety/equipment, Min assist General transfer comment: assist slightly to power up. Ambulation/Gait Ambulation/Gait assistance: Min guard, Min assist, +2 safety/equipment Ambulation Distance (Feet): 140 Feet (50 feet then 75 feet then 15 feet.) Assistive device: Rolling walker (2 wheeled) Gait Pattern/deviations: Step-through pattern, Decreased stride length, Trunk flexed, Wide base of support General Gait Details: Needed several sitting rest breaks. Followed pt with chair. Pt needing mostly cues only and steadying assist.  Gait velocity interpretation: Below normal speed for age/gender    ADL:    Cognition: Cognition Overall Cognitive Status: Within Functional Limits for tasks assessed Orientation Level: Oriented X4 Cognition Arousal/Alertness: Awake/alert Behavior During Therapy: Flat affect Overall Cognitive Status: Within Functional Limits for tasks assessed Difficult to assess due to: Level of arousal  Blood pressure 130/85, pulse 84, temperature 98.1 F (36.7 C), temperature source Oral, resp. rate 20, height 5\' 7"  (1.702 m), weight  83 kg (182 lb 15.7 oz), SpO2 100 %. Physical Exam  Constitutional: He is oriented to person, place, and time.  HENT:  Head: Normocephalic.  Nasogastric tube in place  Eyes: EOM are normal.  Neck: Normal range of motion. Neck  supple. No thyromegaly present.  Cardiovascular:  hum  Respiratory:  Decreased breath sounds at the bases  GI:  Mildly distended, nontender. Distant bowel sounds  Neurological: He is alert and oriented to person, place, and time. No cranial nerve deficit. Coordination normal.  UE grossly 4/5 prox to distal. LE: 3/5 HF 4- KE and 4/5 ADF/PF. No sensory findings.  Psychiatric: He has a normal mood and affect.     Lab Results Last 24 Hours    Results for orders placed or performed during the hospital encounter of 08/26/15 (from the past 24 hour(s))  Glucose, capillary Status: Abnormal   Collection Time: 09/11/15 7:51 AM  Result Value Ref Range   Glucose-Capillary 109 (H) 65 - 99 mg/dL   Comment 1 Capillary Specimen    Comment 2 Notify RN   Basic metabolic panel Status: Abnormal   Collection Time: 09/11/15 8:20 AM  Result Value Ref Range   Sodium 136 135 - 145 mmol/L   Potassium 3.2 (L) 3.5 - 5.1 mmol/L   Chloride 103 101 - 111 mmol/L   CO2 22 22 - 32 mmol/L   Glucose, Bld 88 65 - 99 mg/dL   BUN 50 (H) 6 - 20 mg/dL   Creatinine, Ser 6.76 (H) 0.61 - 1.24 mg/dL   Calcium 8.1 (L) 8.9 - 10.3 mg/dL   GFR calc non Af Amer 9 (L) >60 mL/min   GFR calc Af Amer 10 (L) >60 mL/min   Anion gap 11 5 - 15  Magnesium Status: Abnormal   Collection Time: 09/11/15 8:20 AM  Result Value Ref Range   Magnesium 2.7 (H) 1.7 - 2.4 mg/dL  Carboxyhemoglobin Status: Abnormal   Collection Time: 09/11/15 8:25 AM  Result Value Ref Range   Total hemoglobin 10.5 (L) 13.5 - 18.0 g/dL   O2 Saturation 61.8 %   Carboxyhemoglobin 2.0 (H) 0.5 - 1.5 %   Methemoglobin 1.4 0.0 - 1.5 %  Glucose, capillary Status: Abnormal   Collection Time: 09/11/15 12:56 PM  Result Value Ref Range   Glucose-Capillary 102 (H) 65 - 99 mg/dL   Comment 1 Notify RN   CBC Status: Abnormal    Collection Time: 09/11/15 1:00 PM  Result Value Ref Range   WBC 17.5 (H) 4.0 - 10.5 K/uL   RBC 3.63 (L) 4.22 - 5.81 MIL/uL   Hemoglobin 9.7 (L) 13.0 - 17.0 g/dL   HCT 28.7 (L) 39.0 - 52.0 %   MCV 79.1 78.0 - 100.0 fL   MCH 26.7 26.0 - 34.0 pg   MCHC 33.8 30.0 - 36.0 g/dL   RDW 20.5 (H) 11.5 - 15.5 %   Platelets 153 150 - 400 K/uL  Glucose, capillary Status: None   Collection Time: 09/11/15 3:45 PM  Result Value Ref Range   Glucose-Capillary 95 65 - 99 mg/dL  Glucose, capillary Status: None   Collection Time: 09/11/15 7:37 PM  Result Value Ref Range   Glucose-Capillary 94 65 - 99 mg/dL   Comment 1 Capillary Specimen    Comment 2 Notify RN    Comment 3 Document in Chart   Glucose, capillary Status: Abnormal   Collection Time: 09/11/15 11:44 PM  Result Value Ref Range   Glucose-Capillary 100 (H) 65 - 99 mg/dL  Comment 1 Capillary Specimen    Comment 2 Notify RN    Comment 3 Document in Chart   Lactate dehydrogenase Status: Abnormal   Collection Time: 09/12/15 2:45 AM  Result Value Ref Range   LDH 294 (H) 98 - 192 U/L  CBC Status: Abnormal   Collection Time: 09/12/15 2:45 AM  Result Value Ref Range   WBC 16.5 (H) 4.0 - 10.5 K/uL   RBC 3.66 (L) 4.22 - 5.81 MIL/uL   Hemoglobin 9.9 (L) 13.0 - 17.0 g/dL   HCT 28.8 (L) 39.0 - 52.0 %   MCV 78.7 78.0 - 100.0 fL   MCH 27.0 26.0 - 34.0 pg   MCHC 34.4 30.0 - 36.0 g/dL   RDW 20.5 (H) 11.5 - 15.5 %   Platelets 182 150 - 400 K/uL  Comprehensive metabolic panel Status: Abnormal   Collection Time: 09/12/15 2:45 AM  Result Value Ref Range   Sodium 135 135 - 145 mmol/L   Potassium 3.2 (L) 3.5 - 5.1 mmol/L   Chloride 99 (L) 101 - 111 mmol/L   CO2 27 22 - 32 mmol/L   Glucose, Bld 92 65 - 99 mg/dL   BUN 18 6 - 20 mg/dL    Creatinine, Ser 3.50 (H) 0.61 - 1.24 mg/dL   Calcium 7.9 (L) 8.9 - 10.3 mg/dL   Total Protein 6.2 (L) 6.5 - 8.1 g/dL   Albumin 1.6 (L) 3.5 - 5.0 g/dL   AST 78 (H) 15 - 41 U/L   ALT 25 17 - 63 U/L   Alkaline Phosphatase 168 (H) 38 - 126 U/L   Total Bilirubin 8.8 (H) 0.3 - 1.2 mg/dL   GFR calc non Af Amer 19 (L) >60 mL/min   GFR calc Af Amer 22 (L) >60 mL/min   Anion gap 9 5 - 15  Carboxyhemoglobin Status: Abnormal   Collection Time: 09/12/15 2:50 AM  Result Value Ref Range   Total hemoglobin 10.8 (L) 13.5 - 18.0 g/dL   O2 Saturation 66.1 %   Carboxyhemoglobin 1.9 (H) 0.5 - 1.5 %   Methemoglobin 1.5 0.0 - 1.5 %  Glucose, capillary Status: None   Collection Time: 09/12/15 3:55 AM  Result Value Ref Range   Glucose-Capillary 94 65 - 99 mg/dL   Comment 1 Capillary Specimen    Comment 2 Notify RN    Comment 3 Document in Chart       Imaging Results (Last 48 hours)    Ct Abdomen Wo Contrast  09/11/2015 CLINICAL DATA: Anemia, abdominal distention. EXAM: CT CHEST, ABDOMEN AND PELVIS WITHOUT CONTRAST TECHNIQUE: Multidetector CT imaging of the chest, abdomen and pelvis was performed following the standard protocol without IV contrast. COMPARISON: CT scan of November 30, 2014. FINDINGS: CT CHEST No pneumothorax is noted. Mild bilateral pleural effusions are noted with adjacent subsegmental atelectasis. Multifocal densities are noted in both upper lobes concerning for edema or possibly inflammation. No significant osseous abnormality is noted in the chest. Mild cardiomegaly is noted. Patient is status post left ventricular assistance device placement. Surgical wound in the epigastric region is noted containing debris which may represent inflammatory material. There does not appear to be a significant fluid collection in any deep location. Left subclavian pacemaker is noted. Mildly enlarged adenopathy is  noted in the aortopulmonary window which is not significantly changed compared to prior exam and most likely reactive in etiology. Feeding tube is seen passing through the esophagus and stomach into the proximal duodenum. CT ABDOMEN AND PELVIS Status post cholecystectomy.  No focal abnormality is noted in the liver, spleen or pancreas on these unenhanced images. Adrenal glands and kidneys appear normal. No hydronephrosis or renal obstruction is noted mild amount of free fluid is seen around the inferior portion of the liver. There is no evidence of bowel obstruction. The appendix appears normal. Urinary bladder is decompressed secondary to Foley catheter. Moderate amount of free fluid is noted in the pelvis with Hounsfield measurement of 11 consistent with fluid density. Mild anasarca is noted. Diverticulosis is noted throughout the colon without inflammation. Mildly enlarged bilateral inguinal lymph nodes are noted which most likely are inflammatory in etiology. IMPRESSION: Mild bilateral pleural effusions are noted with adjacent subsegmental atelectasis. Status post left ventricular assistance device placement. Open surgical wound in the epigastric region contains debris which may represent inflammatory material. Clinical correlation is recommended. No deep abscess or fluid collection is noted. Diverticulosis is noted throughout the colon without inflammation. Mild anasarca is noted. Mild amount of free fluid is noted around the liver, with moderate amount of free fluid seen in the pelvis. Mildly enlarged bilateral inguinal adenopathy is noted most likely inflammatory in etiology. Electronically Signed By: Marijo Conception, M.D. On: 09/11/2015 10:10   Ct Chest Wo Contrast  09/11/2015 CLINICAL DATA: Anemia, abdominal distention. EXAM: CT CHEST, ABDOMEN AND PELVIS WITHOUT CONTRAST TECHNIQUE: Multidetector CT imaging of the chest, abdomen and pelvis was performed following the standard protocol without IV  contrast. COMPARISON: CT scan of November 30, 2014. FINDINGS: CT CHEST No pneumothorax is noted. Mild bilateral pleural effusions are noted with adjacent subsegmental atelectasis. Multifocal densities are noted in both upper lobes concerning for edema or possibly inflammation. No significant osseous abnormality is noted in the chest. Mild cardiomegaly is noted. Patient is status post left ventricular assistance device placement. Surgical wound in the epigastric region is noted containing debris which may represent inflammatory material. There does not appear to be a significant fluid collection in any deep location. Left subclavian pacemaker is noted. Mildly enlarged adenopathy is noted in the aortopulmonary window which is not significantly changed compared to prior exam and most likely reactive in etiology. Feeding tube is seen passing through the esophagus and stomach into the proximal duodenum. CT ABDOMEN AND PELVIS Status post cholecystectomy. No focal abnormality is noted in the liver, spleen or pancreas on these unenhanced images. Adrenal glands and kidneys appear normal. No hydronephrosis or renal obstruction is noted mild amount of free fluid is seen around the inferior portion of the liver. There is no evidence of bowel obstruction. The appendix appears normal. Urinary bladder is decompressed secondary to Foley catheter. Moderate amount of free fluid is noted in the pelvis with Hounsfield measurement of 11 consistent with fluid density. Mild anasarca is noted. Diverticulosis is noted throughout the colon without inflammation. Mildly enlarged bilateral inguinal lymph nodes are noted which most likely are inflammatory in etiology. IMPRESSION: Mild bilateral pleural effusions are noted with adjacent subsegmental atelectasis. Status post left ventricular assistance device placement. Open surgical wound in the epigastric region contains debris which may represent inflammatory material. Clinical correlation is  recommended. No deep abscess or fluid collection is noted. Diverticulosis is noted throughout the colon without inflammation. Mild anasarca is noted. Mild amount of free fluid is noted around the liver, with moderate amount of free fluid seen in the pelvis. Mildly enlarged bilateral inguinal adenopathy is noted most likely inflammatory in etiology. Electronically Signed By: Marijo Conception, M.D. On: 09/11/2015 10:10   Dg Chest Port 1 View  09/11/2015  CLINICAL DATA: LEFT ventricular assist device EXAM: PORTABLE CHEST 1 VIEW COMPARISON: Portable exam 0623 hours compared to 09/09/2015 FINDINGS: RIGHT subclavian central venous catheter tip projecting over cavoatrial junction. Feeding tube extends into stomach. LEFT ventricular assist device identified at the inferior margin of the exam. LEFT subclavian AICD with lead projecting over RIGHT ventricle. Enlargement of cardiac silhouette with pulmonary vascular congestion. Diffuse pulmonary infiltrates likely pulmonary edema. No pneumothorax. IMPRESSION: No interval change in pulmonary edema. Electronically Signed By: Lavonia Dana M.D. On: 09/11/2015 07:55   Dg Abd Portable 1v  09/11/2015 CLINICAL DATA: Portable abdominal radiograph, emesis today. EXAM: PORTABLE ABDOMEN - 1 VIEW COMPARISON: Abdominal radiograph of Sep 10, 2015 FINDINGS: There is mild gaseous distention of the stomach. The feeding tube tip appears to lie in the region of the duodenal bulb. The small and large bowel gas pattern is within the limits of normal. The left ventricular assist device where visualized is appropriately positioned. IMPRESSION: Increased volume of gas within the stomach. The small and large bowel gas pattern remains within the limits of normal. The feeding tube tip lies in the region of the duodenal bulb. Electronically Signed By: David Martinique M.D. On: 09/11/2015 07:53   Dg Abd Portable 1v  09/10/2015 CLINICAL DATA: Feeding tube placement EXAM: PORTABLE  ABDOMEN - 1 VIEW COMPARISON: Portable exam 1528 hours compared to 09/10/2015 at 1345 hours FINDINGS: Feeding tube traverses stomach with tip projecting over either the duodenal bulb or proximal descending duodenum. LVAD projects over upper abdomen. AICD lead projects over RIGHT ventricle. Visualized bowel gas pattern normal. IMPRESSION: Tip of feeding tube projects over either the duodenal bulb or proximal descending duodenum. Electronically Signed By: Lavonia Dana M.D. On: 09/10/2015 15:44   Dg Abd Portable 1v  09/10/2015 CLINICAL DATA: Abdominal distention and emesis. EXAM: PORTABLE ABDOMEN - 1 VIEW COMPARISON: One-view abdomen 09/04/2015. FINDINGS: A left ventricular assist device is in place. The bowel gas pattern is normal. Previously noted gastric distention has resolved. There is no obstruction or free air. IMPRESSION: Normal bowel gas pattern. Electronically Signed By: San Morelle M.D. On: 09/10/2015 13:58     Assessment/Plan: Diagnosis: debility related to C diff and abscess associated with LVAD 1. Does the need for close, 24 hr/day medical supervision in concert with the patient's rehab needs make it unreasonable for this patient to be served in a less intensive setting? Yes/potentially 2. Co-Morbidities requiring supervision/potential complications: AKI, htn 3. Due to bladder management, bowel management, safety, skin/wound care, disease management, medication administration, pain management and patient education, does the patient require 24 hr/day rehab nursing? Yes 4. Does the patient require coordinated care of a physician, rehab nurse, PT (1-2 hrs/day, 5 days/week) and OT (1-2 hrs/day, 5 days/week) to address physical and functional deficits in the context of the above medical diagnosis(es)? Yes/potentially Addressing deficits in the following areas: balance, endurance, locomotion, strength, transferring, bowel/bladder control, bathing, dressing, feeding, grooming,  toileting and psychosocial support 5. Can the patient actively participate in an intensive therapy program of at least 3 hrs of therapy per day at least 5 days per week? Yes and Potentially 6. The potential for patient to make measurable gains while on inpatient rehab is excellent 7. Anticipated functional outcomes upon discharge from inpatient rehab are modified independent with PT, modified independent with OT, n/a with SLP. 8. Estimated rehab length of stay to reach the above functional goals is: 7 days 9. Does the patient have adequate social supports and living environment to accommodate these discharge functional goals? Yes 10.  Anticipated D/C setting: Home 11. Anticipated post D/C treatments: [place] therapy 12. Overall Rehab/Functional Prognosis: excellent  RECOMMENDATIONS: This patient's condition is appropriate for continued rehabilitative care in the following setting: CIR Patient has agreed to participate in recommended program. Yes Note that insurance prior authorization may be required for reimbursement for recommended care.  Comment: Depending upon when he's medically stable to move to inpatient rehab, he could benefit from an inpatient rehab stay to reach Mod I goals. (lives alone)  Rehab Admissions Coordinator to follow up.  Thanks,  Meredith Staggers, MD, [facility]     09/12/2015       Revision History     Date/Time User Provider Type Action   09/12/2015 12:33 PM Meredith Staggers, MD Physician Sign   09/12/2015 7:55 AM Cathlyn Parsons, PA-C Physician Assistant Pend   View Details Report       Routing History     Date/Time From To Method   09/12/2015 12:33 PM Meredith Staggers, MD Sandi Mariscal, MD Fax

## 2015-09-25 NOTE — Progress Notes (Signed)
Rehab admissions - Patient has been medically cleared for acute inpatient rehab admission.  Bed available and will admit to acute inpatient rehab today.  Patient is agreeable and we do have insurance authorization.  Call me for questions.  [id number]

## 2015-09-25 NOTE — Clinical Social Work Note (Signed)
CSW received referral for SNF.  Case discussed with case manager, and plan is to discharge inpatient rehab.  CSW to sign off please re-consult if social work needs arise.  Jones Broom. [place], MSW, [place]

## 2015-09-25 NOTE — Progress Notes (Signed)
Retta Diones, RN Rehab Admission Coordinator Signed Physical Medicine and Rehabilitation PMR Pre-admission 09/23/2015 2:28 PM  Related encounter: Admission (Current) from 08/26/2015 in [place] Collapse All   PMR Admission Coordinator Pre-Admission Assessment  Patient: [name] is an 45 y.o., male MRN: [id number] DOB: [birth year] Height: '5\' 7"'$  (170.2 cm) Weight: 77.202 kg (170 lb 3.2 oz)  Insurance Information HMO: PPO: PCP: IPA: 80/20: OTHER: Choice Plus Plan PRIMARY: [facility] Policy#: [id number] Subscriber: [name] CM Name: Sharlett Iles Phone#: [phone] Fax#: Has EPIC access Pre-Cert#: [id number] Employer: [employer] employed Benefits: Phone #: [phone] Name: Laurence Compton. Date: 04/20/09 Deduct: $200 Out of Pocket Max: $2250 (met $886.28) Life Max: unlimited CIR: 100% SNF: 100% with 90 days max Outpatient: 60 combined visits Co-Pay: $10 Home Health: 100% with no visit limits Co-Pay: none DME: 80% Co-Pay: 20% Providers: in network  Medicaid Application Date: Case Manager:  Disability Application Date: Case Worker:   Emergency Contact Information Contact Information    Name Relation Home Work Mobile   [name] Brother [phone]    [name], [name] Sister   [phone]   [name] [phone]     [name] [phone]  [phone]     Current Medical History  Patient Admitting Diagnosis: Debility after LVAD drive line abscess  History of Present Illness: A 45 y.o. right handed male with history of systolic congestive heart failure, LVAD 12/20/2014, atrial fibrillation maintained on Coumadin, medical  noncompliance. Patient lives alone in [place]. Independent prior to admission. He has a brother in [place]. Recently traveled to [place] to see his mother and developed fever chills and abdominal pain. Went to [facility] in [place]. Initially felt to have pyelonephritis but CT scan negative. Found to have C. difficile colitis placed on oral vancomycin. Developed subxiphoid swelling and subsequent had a CT which showed large abscess surrounding driveline at LVAD site. Blood cultures showed MSSA. Seen by infectious disease started on nafcillin and transferred to [facility] for further management. Since initial infection was superficial to pump and walled off so pump exchange was not indicated. Patient underwent irrigation and debridement of chest sternal wound 09/09/2015 with closure of abdominal wound 09/18/2015 per Dr. Nils Pyle. TEE showed no vegetation on the valves were ICD wire. Presently remains on cefazolin and rifampin day 30/42 completed. Hospital course ileus and diet slowly advanced with nasogastric tube remaining in place for a short time. Bouts of nausea vomiting felt to be related to antibiotic and rifampin or change to intravenous due to nausea. He remains on twice a day vancomycin for C. difficile through 10/08/2015. Nephrology consulted 09/03/2015 for AKI elevated 1.40-3.89-7.13. Renal ultrasound negative. Hemodialysis initiated. Physical therapy evaluation completed with recommendations to physical medicine rehabilitation consult. Patient to be admitted for a comprehensive inpatient rehabilitation program.  Past Medical History  Past Medical History  Diagnosis Date  . Chronic systolic heart failure (HCC)     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%)  . Atrial fibrillation -parosysmal     Rx w amiodarone  . Noncompliance     H/O MEDICAL NONCOMPLIANCE  . 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 Tricuspid valve regurgitation     SEVERE  . Severe mitral regurgitation   . Polymorphic ventricular tachycardia (HCC)     RECURRENT WITH APPROPRIATE SHOCK THERAPY IN THE PAST  . Ventricular fibrillation (HCC)     WITH APPROPRIATE  SHOCK THERAPY IN THE PAST  . Hypertension   . Gout   . RA (rheumatoid arthritis) ([place])   . Automatic implantable cardiac defibrillator -BSX     single chamber  . GI bleed -massive     11 Units 2012  . Elevated LFTs   . H/O hyperthyroidism   . CHF (congestive heart failure) ([place])   . AKI (acute kidney injury) ([place])   . AICD (automatic cardioverter/defibrillator) present    Family History  family history includes Heart failure in his brother.  Prior Rehab/Hospitalizations: Had [facility] PT after LVAD placed in 09/16  Has the patient had major surgery during 100 days prior to admission? No  Current Medications   Current facility-administered medications:  . 0.9 % sodium chloride infusion, , Intravenous, Once, Rexene Agent, MD, Stopped at 09/17/15 1100 . 0.9 % sodium chloride infusion, , Intravenous, Continuous, Effie Berkshire, MD, Stopped at 09/24/15 1500 . acetaminophen (TYLENOL) solution 500 mg, 500 mg, Oral, Q6H PRN, Rafael Bihari, MD, 500 mg at 09/22/15 2014 . allopurinol (ZYLOPRIM) tablet 100 mg, 100 mg, Oral, Daily, Jolaine Artist, MD, 100 mg at 09/24/15 1054 . alteplase (CATHFLO ACTIVASE) injection 4 mg, 4 mg, Intracatheter, Once, Amy D Clegg, NP, 4 mg at 09/18/15 1100 . bisacodyl (DULCOLAX) EC tablet 10 mg, 10 mg, Oral, Daily PRN **OR** bisacodyl (DULCOLAX) suppository 10 mg, 10 mg, Rectal, Daily PRN, Amy D Clegg, NP . ceFAZolin (ANCEF) IVPB 2g/100 mL premix, 2 g, Intravenous, Q24H, Larey Dresser, MD, 2 g at 09/24/15 1954 . citalopram (CELEXA) tablet 20 mg, 20 mg, Oral, Daily, Amy D Clegg, NP, 20 mg at 09/24/15 1054 . docusate sodium  (COLACE) capsule 200 mg, 200 mg, Oral, Daily, Amy D Clegg, NP, Stopped at 09/17/15 1000 . famotidine (PEPCID) IVPB 20 mg premix, 20 mg, Intravenous, Q24H, Wynelle Fanny, RPH, 20 mg at 09/25/15 0844 . feeding supplement (NEPRO CARB STEADY) liquid 237 mL, 237 mL, Oral, BID BM, Jolaine Artist, MD, 237 mL at 09/24/15 1100 . fentaNYL (SUBLIMAZE) injection 25 mcg, 25 mcg, Intravenous, Q4H PRN, Ivin Poot, MD, 25 mcg at 09/20/15 0002 . fluconazole (DIFLUCAN) IVPB 100 mg, 100 mg, Intravenous, Q24H, Larey Dresser, MD, 100 mg at 09/24/15 1300 . gelatin adsorbable (GELFOAM/SURGIFOAM) sponge 12-7 mm 2 each, 2 each, Topical, BID PRN, Gaye Pollack, MD . Gerhardt's butt cream, , Topical, PRN, Ivin Poot, MD . guaiFENesin (ROBITUSSIN) 100 MG/5ML solution 100 mg, 5 mL, Oral, Q4H PRN, Larey Dresser, MD, 100 mg at 09/24/15 1617 . heparin injection 1,000 Units, 1,000 Units, Dialysis, PRN, Mauricia Area, MD . heparin injection 8,300 Units, 100 Units/kg, Dialysis, PRN, Mauricia Area, MD . hydrALAZINE (APRESOLINE) injection 10 mg, 10 mg, Intravenous, Q4H PRN, Jolaine Artist, MD, 10 mg at 09/22/15 1216 . hydrALAZINE (APRESOLINE) tablet 25 mg, 25 mg, Oral, Q8H, Larey Dresser, MD, 25 mg at 09/25/15 0530 . lidocaine (PF) (XYLOCAINE) 1 % injection 5 mL, 5 mL, Intradermal, PRN, Mauricia Area, MD . lidocaine-prilocaine (EMLA) cream 1 application, 1 application, Topical, PRN, Mauricia Area, MD . magic mouthwash, 10 mL, Oral, TID, Ivin Poot, MD, 10 mL at 09/22/15 0944 . menthol-cetylpyridinium (CEPACOL) lozenge 3 mg, 1 lozenge, Oral, PRN, [name], MD . metoCLOPramide (REGLAN) tablet 5 mg, 5 mg, Oral, Q6H, Jolaine Artist, MD, 5 mg at 09/25/15 0529 . ondansetron (ZOFRAN) injection 4 mg, 4 mg, Intravenous, Q4H PRN, Amy D Clegg, NP, 4 mg at 09/17/15 1557 . oxyCODONE (  Oxy IR/ROXICODONE) immediate release tablet 5 mg, 5 mg, Oral, Q3H PRN, Ivin Poot, MD, 5 mg  at 09/21/15 0819 . pentafluoroprop-tetrafluoroeth (GEBAUERS) aerosol 1 application, 1 application, Topical, PRN, Mauricia Area, MD . pregabalin (LYRICA) capsule 75 mg, 75 mg, Oral, Daily, Jolaine Artist, MD, 75 mg at 09/24/15 1053 . rifampin (RIFADIN) capsule 300 mg, 300 mg, Oral, Q12H, Amy D Clegg, NP, 300 mg at 09/24/15 2158 . sildenafil (REVATIO) tablet 20 mg, 20 mg, Oral, Q8H, Amy D Clegg, NP, 20 mg at 09/25/15 0530 . simethicone (MYLICON) chewable tablet 80 mg, 80 mg, Oral, QID, Ivin Poot, MD, 80 mg at 09/24/15 2158 . sodium chloride flush (NS) 0.9 % injection 10-40 mL, 10-40 mL, Intracatheter, Q12H, Jolaine Artist, MD, 10 mL at 09/24/15 2158 . sodium chloride flush (NS) 0.9 % injection 10-40 mL, 10-40 mL, Intracatheter, PRN, Jolaine Artist, MD, 30 mL at 09/24/15 0645 . tamsulosin (FLOMAX) capsule 0.4 mg, 0.4 mg, Oral, Daily, Jolaine Artist, MD, 0.4 mg at 09/24/15 1054 . traMADol (ULTRAM) tablet 50 mg, 50 mg, Oral, Q6H PRN, Jolaine Artist, MD, 50 mg at 09/20/15 3710 . vancomycin (VANCOCIN) 50 mg/mL oral solution 125 mg, 125 mg, Oral, Q12H, Carlyle Basques, MD, 125 mg at 09/24/15 2158 . warfarin (COUMADIN) tablet 4 mg, 4 mg, Oral, ONCE-1800, Gay Filler Carney, RPH . Warfarin - Pharmacist Dosing Inpatient, , Does not apply, q1800, Pat Patrick, RPH  Patients Current Diet: Diet renal with fluid restriction Fluid restriction:: 1200 mL Fluid; Room service appropriate?: Yes; Fluid consistency:: Thin  Precautions / Restrictions Precautions Precautions: Fall Restrictions Weight Bearing Restrictions: No   Has the patient had 2 or more falls or a fall with injury in the past year?No  Prior Activity Level Community (5-7x/wk): Went out daily. Worked FT 8 am to 5 pm. Was driving himself.  Home Assistive Devices / Equipment Home Assistive Devices/Equipment: Blood pressure cuff, Other (Comment) (LVAD supplies) Home Equipment: None  Prior Device Use:  Indicate devices/aids used by the patient prior to current illness, exacerbation or injury? None  Prior Functional Level Prior Function Level of Independence: Independent Comments: Driving  Self Care: Did the patient need help bathing, dressing, using the toilet or eating? Independent  Indoor Mobility: Did the patient need assistance with walking from room to room (with or without device)? Independent  Stairs: Did the patient need assistance with internal or external stairs (with or without device)? Independent  Functional Cognition: Did the patient need help planning regular tasks such as shopping or remembering to take medications? Independent  Current Functional Level Cognition  Overall Cognitive Status: Impaired/Different from baseline Difficult to assess due to: Level of arousal Current Attention Level: Selective Orientation Level: Oriented X4 General Comments: Pt slow to respond and initiate activity. He required min A to load batteries and cotroller into vest. Made errors, and unable to correct errors without prompting    Extremity Assessment (includes Sensation/Coordination)  Upper Extremity Assessment: Generalized weakness  Lower Extremity Assessment: Defer to PT evaluation    ADLs  Overall ADL's : Needs assistance/impaired Eating/Feeding: Set up, Sitting Grooming: Wash/dry hands, Wash/dry face, Oral care, Min guard, Standing Upper Body Bathing: Minimal assitance, Sitting Lower Body Bathing: Minimal assistance, Sit to/from stand Upper Body Dressing : Moderate assistance, Sitting Upper Body Dressing Details (indicate cue type and reason): vest and front opening gown Lower Body Dressing: Minimal assistance, Sit to/from stand Lower Body Dressing Details (indicate cue type and reason): continues to be able to don  socks despite abdominal pain Toilet Transfer: Minimal assistance, Ambulation, Comfort height toilet, Regular Toilet, Grab bars, RW Toilet Transfer  Details (indicate cue type and reason): Pt requires assist to steady  Toileting- Clothing Manipulation and Hygiene: Moderate assistance, Sit to/from stand Functional mobility during ADLs: Minimal assistance, Wheelchair General ADL Comments: Pt immediately willing to get OOB with therapists, requesting to go outside. Much brighter affect today.    Mobility  Overal bed mobility: Needs Assistance Bed Mobility: Supine to Sit Rolling: Min guard Sidelying to sit: Min assist, HOB elevated Supine to sit: Min assist Sit to supine: Min assist General bed mobility comments: requires increased time     Transfers  Overall transfer level: Needs assistance Equipment used: None Transfers: Sit to/from Stand, Stand Pivot Transfers Sit to Stand: Min assist Stand pivot transfers: Min assist General transfer comment: min A to steady. Pt shaky and unsteady     Ambulation / Gait / Stairs / Wheelchair Mobility  Ambulation/Gait Ambulation/Gait assistance: Architect (Feet): 175 Feet (standing rest break (leaning) x 90 seconds; 175 ft back) Assistive device: None Gait Pattern/deviations: Step-through pattern, Decreased stride length, Drifts right/left, Narrow base of support General Gait Details: decr velocity; drifts R/L especially with head turns (required min assist for balance) Gait velocity: 2.24 ft/sec (age norm 3.72; crossing road 4.4 ft/sec) Gait velocity interpretation: Below normal speed for age/gender    Posture / Balance Dynamic Sitting Balance Sitting balance - Comments: Pt sat 10 minutes EOB with supervision while nsg and therapy cleaned pt and changed gown.  Balance Overall balance assessment: Needs assistance Sitting-balance support: Feet supported Sitting balance-Leahy Scale: Good Sitting balance - Comments: Pt sat 10 minutes EOB with supervision while nsg and therapy cleaned pt and changed gown.  Postural control: Posterior lean Standing balance  support: During functional activity, Single extremity supported Standing balance-Leahy Scale: Poor Standing balance comment: reliant on UE support  Standardized Balance Assessment Standardized Balance Assessment : Berg Balance Test, Dynamic Gait Index Berg Balance Test Sit to Stand: Needs minimal aid to stand or to stabilize Standing Unsupported: Able to stand 2 minutes with supervision Sitting with Back Unsupported but Feet Supported on Floor or Stool: Able to sit safely and securely 2 minutes Stand to Sit: Controls descent by using hands Transfers: Needs one person to assist Standing Unsupported with Eyes Closed: Able to stand 10 seconds with supervision Standing Ubsupported with Feet Together: Needs help to attain position but able to stand for 30 seconds with feet together From Standing, Reach Forward with Outstretched Arm: Can reach forward >12 cm safely (5") From Standing Position, Pick up Object from Floor: Unable to try/needs assist to keep balance (not attempted due to complex abd wound) From Standing Position, Turn to Look Behind Over each Shoulder: Needs supervision when turning Turn 360 Degrees: Needs close supervision or verbal cueing Standing Unsupported, Alternately Place Feet on Step/Stool: Able to complete >2 steps/needs minimal assist Standing Unsupported, One Foot in Front: Needs help to step but can hold 15 seconds Standing on One Leg: Unable to try or needs assist to prevent fall (grabs for bil UE support as initiating lifting foot) Total Score: 23 Dynamic Gait Index Level Surface: Mild Impairment Change in Gait Speed: Mild Impairment Gait with Horizontal Head Turns: Mild Impairment Gait with Vertical Head Turns: Moderate Impairment Gait and Pivot Turn: Moderate Impairment    Special needs/care consideration BiPAP/CPAP No CPM No Continuous Drip IV KVO Dialysis Yes Days On intermittent HD Life Vest No Oxygen No Special  Bed No Trach Size No Wound Vac  (area) No  Skin Has dressings to right chest, abdomen  Bowel mgmt: Last BM 09/24/15, loose, incontinent Bladder mgmt: Voiding Diabetic mgmt No    Previous Home Environment Living Arrangements: Alone Available Help at Discharge: Family, Available 24 hours/day (siblings will assist) Type of Home: House Home Layout: One level Home Access: Level entry Bathroom Shower/Tub: Chiropodist: Standard Bathroom Accessibility: Yes Home Care Services: No  Discharge Living Setting Plans for Discharge Living Setting: Alone, House Type of Home at Discharge: House Discharge Home Layout: One level Discharge Home Access: Stairs to enter Entrance Stairs-Number of Steps: 1 Does the patient have any problems obtaining your medications?: No  Social/Family/Support Systems Patient Roles: Other (Comment) (Has a brother, 2 sisters and friends.) Contact Information: [name] - brother [phone] Anticipated Caregiver: [name] - sister Anticipated Caregiver's Contact Information: [name] - [phone] Ability/Limitations of Caregiver: Sisters in [facility] likely to come stay with patient after discharge. He has 2 sisters. Caregiver Availability: Other (Comment) (Can get assistance if needed.) Discharge Plan Discussed with Primary Caregiver: Yes Is Caregiver In Agreement with Plan?: Yes Does Caregiver/Family have Issues with Lodging/Transportation while Pt is in Rehab?: No  Goals/Additional Needs Patient/Family Goal for Rehab: PT/OT mod I goals Expected length of stay: 7 days Cultural Considerations: [employer], attends church Dietary Needs: Renal, 1200 cc fluid restriction per day Equipment Needs: TBD Additional Information: His 2 sisters came from [facility] and stayed with him after LVAD placed last 09/16. Pt/Family Agrees to Admission and willing to participate: Yes Program Orientation Provided & Reviewed with Pt/Caregiver Including Roles &  Responsibilities: Yes  Decrease burden of Care through IP rehab admission: N/A  Possible need for SNF placement upon discharge: Not planned  Patient Condition: This patient's medical and functional status has changed since the consult dated: 09/12/15 in which the Rehabilitation Physician determined and documented that the patient's condition is appropriate for intensive rehabilitative care in an inpatient rehabilitation facility. See "History of Present Illness" (above) for medical update. Functional changes are: Currently requiring min assist to ambulte 175 feet no device. Patient's medical and functional status update has been discussed with the Rehabilitation physician and patient remains appropriate for inpatient rehabilitation. Will admit to inpatient rehab today.  Preadmission Screen Completed By: Retta Diones, 09/25/2015 10:47 AM ______________________________________________________________________  Discussed status with Dr. Letta Pate on 09/25/15 at 1046 and received telephone approval for admission today.  Admission Coordinator: Retta Diones, time1046/Date06/07/17          Cosigned by: Charlett Blake, MD at 09/25/2015 12:41 PM  Revision History     Date/Time User Provider Type Action   09/25/2015 12:41 PM Charlett Blake, MD Physician Cosign   09/25/2015 10:48 AM Retta Diones, RN Rehab Admission Coordinator Sign

## 2015-09-25 NOTE — Progress Notes (Signed)
S: feels well, no N/V O:BP 110/88 mmHg  Pulse 83  Temp(Src) 98.1 F (36.7 C) (Oral)  Resp 16  Ht 5\' 7"  (1.702 m)  Wt 77.202 kg (170 lb 3.2 oz)  BMI 26.65 kg/m2  SpO2 100%  Intake/Output Summary (Last 24 hours) at 09/25/15 [id number] Last data filed at 09/25/15 0844  Gross per 24 hour  Intake    880 ml  Output   2050 ml  Net  -1170 ml   Weight change: -1.498 kg (-3 lb 4.8 oz) [id number] and alert CVS: RRR Resp:  Few Bibasilar crackles Abd: Bandage over LVAD, + BS NTND Ext: no edema NEURO: CNI Ox3 no asterixis   . sodium chloride   Intravenous Once  . allopurinol  100 mg Oral Daily  . alteplase  4 mg Intracatheter Once  .  ceFAZolin (ANCEF) IV  2 g Intravenous Q24H  . citalopram  20 mg Oral Daily  . docusate sodium  200 mg Oral Daily  . famotidine (PEPCID) IV  20 mg Intravenous Q24H  . feeding supplement (NEPRO CARB STEADY)  237 mL Oral BID BM  . fluconazole (DIFLUCAN) IV  100 mg Intravenous Q24H  . hydrALAZINE  25 mg Oral Q8H  . magic mouthwash  10 mL Oral TID  . metoCLOPramide  5 mg Oral Q6H  . pregabalin  75 mg Oral Daily  . rifampin  300 mg Oral Q12H  . sildenafil  20 mg Oral Q8H  . simethicone  80 mg Oral QID  . sodium chloride flush  10-40 mL Intracatheter Q12H  . tamsulosin  0.4 mg Oral Daily  . vancomycin  125 mg Oral Q12H  . warfarin  4 mg Oral ONCE-1800  . Warfarin - Pharmacist Dosing Inpatient   Does not apply q1800   No results found. BMET    Component Value Date/Time   NA 139 09/25/2015 0515   K 5.1 09/25/2015 0515   CL 106 09/25/2015 0515   CO2 20* 09/25/2015 0515   GLUCOSE 74 09/25/2015 0515   BUN 58* 09/25/2015 0515   CREATININE 6.63* 09/25/2015 0515   CREATININE 0.92 02/01/2015 1132   CALCIUM 8.7* 09/25/2015 0515   GFRNONAA 9* 09/25/2015 0515   GFRAA 10* 09/25/2015 0515   CBC    Component Value Date/Time   WBC 13.8* 09/24/2015 1400   RBC 3.42* 09/24/2015 1400   HGB 9.2* 09/24/2015 1400   HCT 28.6* 09/24/2015 1400   PLT 220 09/24/2015  1400   MCV 83.6 09/24/2015 1400   MCH 26.9 09/24/2015 1400   MCHC 32.2 09/24/2015 1400   RDW 19.4* 09/24/2015 1400   LYMPHSABS 1.1 09/17/2015 1132   MONOABS 1.6* 09/17/2015 1132   EOSABS 0.1 09/17/2015 1132   BASOSABS 0.0 09/17/2015 1132     Assessment: 1. ARF, nonoliguric.  Scr appears to be stabiliizing.  UO remains excellent 2. LVAD abscess, MSSA 3. Anemia 4. Severe CHF Plan: 1.  No plans for HD as renal fx will hopefully start improving.  He can go to rehab from my standpoint 2. Recheck Scr in AM  Raquel Sayres T

## 2015-09-25 NOTE — Progress Notes (Addendum)
[facility] Coumadin/fluconazole Indication: LVAD / yeast in urine  Allergies  Allergen Reactions  . Phytonadione Other (See Comments)    Patient has LVAD: please check with LVAD coordinator on call or LVAD MD on call before reversal of anticoagulation with vit k    Patient Measurements: Height: 5\' 7"  (170.2 cm) Weight: 170 lb 3.2 oz (77.202 kg) IBW/kg (Calculated) : 66.1 Heparin Dosing Weight: n/a  Vital Signs: Temp: 98.1 F (36.7 C) (06/07 0852) Temp Source: Oral (06/07 0852) BP: 110/88 mmHg (06/07 0800)  Labs:  Recent Labs  09/23/15 0440 09/24/15 0455 09/24/15 1400 09/25/15 0515 09/25/15 0900  HGB 8.6*  --  9.2*  --  7.9*  HCT 26.3*  --  28.6*  --  24.9*  PLT 230  --  220  --  187  LABPROT 31.1* 32.0*  --  25.3*  --   INR 3.06* 3.18*  --  2.33*  --   CREATININE 6.29* 6.50*  --  6.63*  --     Estimated Creatinine Clearance: 12.9 mL/min (by C-G formula based on Cr of 6.63).   Medications:  Scheduled:  . sodium chloride   Intravenous Once  . allopurinol  100 mg Oral Daily  . alteplase  4 mg Intracatheter Once  .  ceFAZolin (ANCEF) IV  2 g Intravenous Q24H  . citalopram  20 mg Oral Daily  . docusate sodium  200 mg Oral Daily  . famotidine (PEPCID) IV  20 mg Intravenous Q24H  . feeding supplement (NEPRO CARB STEADY)  237 mL Oral BID BM  . fluconazole (DIFLUCAN) IV  100 mg Intravenous Q24H  . hydrALAZINE  25 mg Oral Q8H  . magic mouthwash  10 mL Oral TID  . metoCLOPramide  5 mg Oral Q6H  . pregabalin  75 mg Oral Daily  . rifampin  300 mg Oral Q12H  . sildenafil  20 mg Oral Q8H  . simethicone  80 mg Oral QID  . sodium chloride flush  10-40 mL Intracatheter Q12H  . tamsulosin  0.4 mg Oral Daily  . vancomycin  125 mg Oral Q12H  . warfarin  4 mg Oral ONCE-1800  . Warfarin - Pharmacist Dosing Inpatient   Does not apply q1800    Assessment: 45 yo male with LVAD on chronic Coumadin.  Pharmacy now dosing at [place]  request.  Patient receiving ancef and rifampin for driveline infection and MSSA bacteremia.  Also on fluconazole for yeast in urine.  INR rising despite holding doses of Coumadin, as anticipated given drug interactions with both rifampin and fluconazole.  No bleeding or complications noted.  5/2 Nafcillin>5/15 5/15 imip>5/18 5/18 CTX > 5/23 5/23 Ancef> (6/20) 5/2 po vanc> (6/20) 5/15 vanc IV> 5/17 5/8 rifampin> (6/20) 6/4 fluconazole> per ID limit to 7 days- d/c 6/10  5/9 Abd abscess cx > MSSA 5/9 BCx x 2 > ngtd 5/15 sputum > Klebsiella (pan S except to ampicillin) 5/15 urine > negative 5/15 blood x 1 > ngtd 5/28 cdiff - neg 5/30 bld x2 -ngtd 5/30 urine - >100k yeast 6/3 urine - >100K yeast  Goal of Therapy:  INR 2-2.5 Monitor platelets by anticoagulation protocol: Yes   Plan:  1. Coumadin 4 mg x 1 tonight. 2. F/u daily INR 3. Continue fluconazole 100 mg daily, change to oral when he goes to rehab.  Stop date 6/10.  Uvaldo Rising, BCPS  Clinical Pharmacist Pager [phone]  09/25/2015 1:10 PM

## 2015-09-25 NOTE — Progress Notes (Addendum)
Patient ID: [name], male   DOB: 12-02-1970, 45 y.o.   MRN: [id number]    HeartMate 2 Rounding Note  Subjective:    Admitted from [facility] in [place] with driveline abscess. Blood cultures --> MSSA and has C diff. Remains on cefazolin  and oral vancomycin.   08/27/15 S/P I &D Epigastric with hardware exposed and VAC placement.  5/11 repeat I&D and wound vac replacement.  5/11 TEE no vegetations on valves or ICD wire 5/22 S/P ABRA and Acell  5/30 EGD- normal. Wound Closure.   Hungry. Overall feeling better. Denies nausea.   UOP about 2 liters. .   Urine culture with > 100K yeast. Started on fluconazole.    VAD INTERROGATION:  HeartMate II LVAD:  Flow 4.8  liters/min, speed 9200, power 5  PI 7.8. No PI events.     Objective:    Vital Signs:   Temp:  [97.4 F (36.3 C)-98.3 F (36.8 C)] 97.9 F (36.6 C) (06/07 0500) Pulse Rate:  [66-101] 83 (06/06 1955) Resp:  [14-20] 16 (06/07 0500) BP: (98-105)/(84-90) 98/84 mmHg (06/06 1955) SpO2:  [97 %-100 %] 97 % (06/07 0500) Weight:  [170 lb 3.2 oz (77.202 kg)] 170 lb 3.2 oz (77.202 kg) (06/07 0500) Last BM Date: 09/24/15 Mean arterial Pressure  80-90s   Intake/Output:   Intake/Output Summary (Last 24 hours) at 09/25/15 0834 Last data filed at 09/25/15 0600  Gross per 24 hour  Intake    960 ml  Output   2050 ml  Net  -1090 ml     Physical Exam: General: In the bed. NAD HEENT: Scleral icterus improving Neck: supple. JVP 6 cm. Carotids 2+ bilat; no bruits. No lymphadenopathy or thryomegaly appreciated. R subclavian trialysis cath dressing dry Cor: Mechanical heart sounds with LVAD hum present. Lungs: clear on  Abdomen: soft, tender, . + bowel sounds. Dressing ok.  Driveline: C/D/I; securement device intact and driveline incorporated Extremities: no cyanosis, clubbing, rash, trace edema  Neuro: A & O x3  Telemetry:  NSR 90s with PVCs.      Labs: Basic Metabolic Panel:  Recent Labs Lab  09/19/15 0440  09/21/15 0437 09/22/15 0415 09/23/15 0440 09/24/15 0455 09/25/15 0515  NA 138  < > 139 140 140 141 139  K 3.4*  < > 3.6 4.1 4.5 5.1 5.1  CL 102  < > 102 104 106 106 106  CO2 25  < > 27 24 22  21* 20*  GLUCOSE 93  < > 96 78 80 76 74  BUN 51*  < > 42* 51* 55* 59* 58*  CREATININE 5.74*  < > 4.79* 5.80* 6.29* 6.50* 6.63*  CALCIUM 8.7*  < > 8.8* 8.7* 8.4* 8.8* 8.7*  MG 2.1  --   --   --   --   --   --   PHOS 5.1*  --  4.1 5.4* 6.3* 6.5* 6.7*  < > = values in this interval not displayed.  Liver Function Tests:  Recent Labs Lab 09/19/15 0440 09/20/15 0351 09/21/15 0437 09/22/15 0415 09/23/15 0440 09/24/15 0455 09/25/15 0515  AST 74* 66* 94* 93* 75*  --   --   ALT 15* 12* 18 15* 11*  --   --   ALKPHOS 222* 203* 227* 222* 226*  --   --   BILITOT 5.2* 5.0* 5.1* 5.5* 5.0*  --   --   PROT 6.8 6.4* 6.6 6.8 7.0  --   --   ALBUMIN 1.6*  1.8* 1.7* 1.7* 1.8* 1.7* 1.7*   No results for input(s): LIPASE, AMYLASE in the last 168 hours. No results for input(s): AMMONIA in the last 168 hours.  CBC:  Recent Labs Lab 09/20/15 0351 09/21/15 0437 09/22/15 0415 09/23/15 0440 09/24/15 1400  WBC 13.2* 13.8* 13.6* 12.3* 13.8*  HGB 8.1* 9.0* 8.3* 8.6* 9.2*  HCT 24.8* 28.3* 26.0* 26.3* 28.6*  MCV 84.1 84.2 81.8 81.9 83.6  PLT 210 228 189 230 220    INR:  Recent Labs Lab 09/21/15 0437 09/22/15 0415 09/23/15 0440 09/24/15 0455 09/25/15 0515  INR 1.64* 2.08* 3.06* 3.18* 2.33*    Other results:    Imaging: No results found.   Medications:     Scheduled Medications: . sodium chloride   Intravenous Once  . allopurinol  100 mg Oral Daily  . alteplase  4 mg Intracatheter Once  .  ceFAZolin (ANCEF) IV  2 g Intravenous Q24H  . citalopram  20 mg Oral Daily  . docusate sodium  200 mg Oral Daily  . famotidine (PEPCID) IV  20 mg Intravenous Q24H  . feeding supplement (NEPRO CARB STEADY)  237 mL Oral BID BM  . fluconazole (DIFLUCAN) IV  100 mg Intravenous Q24H   . hydrALAZINE  25 mg Oral Q8H  . magic mouthwash  10 mL Oral TID  . metoCLOPramide  5 mg Oral Q6H  . pregabalin  75 mg Oral Daily  . rifampin  300 mg Oral Q12H  . sildenafil  20 mg Oral Q8H  . simethicone  80 mg Oral QID  . sodium chloride flush  10-40 mL Intracatheter Q12H  . tamsulosin  0.4 mg Oral Daily  . vancomycin  125 mg Oral Q12H    Infusions: . sodium chloride Stopped (09/24/15 1500)    PRN Medications: acetaminophen (TYLENOL) oral liquid 160 mg/5 mL, bisacodyl **OR** bisacodyl, fentaNYL (SUBLIMAZE) injection, gelatin adsorbable, Gerhardt's butt cream, guaiFENesin, heparin, heparin, hydrALAZINE, lidocaine (PF), lidocaine-prilocaine, menthol-cetylpyridinium, ondansetron, oxyCODONE, pentafluoroprop-tetrafluoroeth, sodium chloride flush, traMADol   Assessment:   1. LVAD Complication- Driveline abscess 2. MSSA bacteremia -> septic shock 3. Acute/Chronic systolic HF s/p VAD placement 9/16 4. C. Difficile colitis 5. 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 Acute blood loss anemia 8. Severe Malnutrition.   9. AKI - currently requiring HD.  10. Klebsiella (ESBL) pneumonia 11. NSVT  12. Hypokalemia  13. RV Failure  14. Malnutrition 15. Yeast in urine  Plan/Discussion:    S/P I&D LVAD driveline abscess. Exposed hardware. 09/09/15 S/P ABRA and reapplication of ACell. ABR removed 5/30 and wound closed by Dr Prescott Gum. Coumadin restarted, INR 2.3 He has not received coumadin since 6/4. Pharmacy dosing.      Remains on cefazolin + rifampin for MSSA bacteremia and oral vanc for C difficile.  [place] PNA has been treated. PO intake improved.  Ancef, oral Vanc, and Rifampin  through June 20th    Urine with >100K yeast.  Day 4/7 fluconazole per ID.  Complete fluconazole through June 10th.    Co-ox 70%.     Has RV failure. Continue 20 mg sildenafil tid.    Maps better. Continue hydralazine 25 mg every 8 hrs. Continue for now.   Renal following.  Creatinine up to 6.63 Good UOP, about  2 liters. hen ok with renal can go to CIR.   VAD parameters ok.  PT following. Recommending CIR.   NSR today.  Off amio with elevated LFTs.   Length of Stay: 30  Amy Clegg  NP-C  09/25/2015, 8:34 AM  VAD Team --- VAD ISSUES ONLY--- Pager [phone] (7am - 7am)  Advanced Heart Failure Team  Pager [phone] (M-F; 7a - 4p)  Please contact [facility] for night-coverage after hours (4p -7a ) and weekends on [email or web address]  Patient seen and examined with Darrick Grinder, NP. We discussed all aspects of the encounter. I agree with the assessment and plan as stated above.   Looks good. Creatinine slightly worse but making good urine and no uremia. Doubt he will need HD today.   Wound had more drainage overnight. ID and Dr. Prescott Gum managing wound and abx.   VAD Parameters and volume status stable. Can go to CIR when [facility] with Dr. Prescott Gum and Renal.   INR 2.33  Ashlea Dusing,MD 8:58 AM

## 2015-09-25 NOTE — Progress Notes (Signed)
09/25/2015, 5:36 PM  :  Pharmacy asked to continue Coumadin per Protocol upon transfer from inpatient unit. Coumadin dose continued as ordered prior to afternoon transfer. Marthenia Rolling, Pharm.D.

## 2015-09-25 NOTE — Progress Notes (Signed)
Patient arrived onto CIR in wheelchair with nurses assistance to room 4 W 04. Patient given admission information packet, and discussed safety plan/agreement, he verbalized understanding. All questions answered, will continue to monitor patient. [place]

## 2015-09-25 NOTE — Progress Notes (Addendum)
[place] for Infectious Disease   Reason for visit: Follow up on drive line infection, MSSA bacteremia.    Interval History: he remains afebrile.  Day 30/42 of IV cefazolin with rifampin.  No n/v.  No diarrhea or abdominal pain.  OK with oral rifampin.    Physical Exam: Constitutional:  Filed Vitals:   09/25/15 0800 09/25/15 0852  BP: 110/88   Pulse:    Temp:  98.1 F (36.7 C)  Resp: 16    patient appears in NAD; in chair Eyes: icteric HENT: no thrush Respiratory: Normal respiratory effort; CTA B Cardiovascular: LVAD GI: soft, nt, nd  Review of Systems: Constitutional: negative for fevers and chills Gastrointestinal: negative for diarrhea Musculoskeletal: negative for arthralgias  Lab Results  Component Value Date   WBC 13.8* 09/24/2015   HGB 9.2* 09/24/2015   HCT 28.6* 09/24/2015   MCV 83.6 09/24/2015   PLT 220 09/24/2015    Lab Results  Component Value Date   CREATININE 6.63* 09/25/2015   BUN 58* 09/25/2015   NA 139 09/25/2015   K 5.1 09/25/2015   CL 106 09/25/2015   CO2 20* 09/25/2015    Lab Results  Component Value Date   ALT 11* 09/23/2015   AST 75* 09/23/2015   ALKPHOS 226* 09/23/2015     Microbiology: Recent Results (from the past 240 hour(s))  Urine culture     Status: Abnormal   Collection Time: 09/17/15 11:17 AM  Result Value Ref Range Status   Specimen Description URINE, CATHETERIZED  Final   Special Requests NONE  Final   Culture >=100,000 COLONIES/mL YEAST (A)  Final   Report Status 09/18/2015 FINAL  Final  Aerobic Culture (superficial specimen) (NOT AT [facility])     Status: None   Collection Time: 09/17/15  1:24 PM  Result Value Ref Range Status   Specimen Description WOUND ABDOMEN  Final   Special Requests PATIENT ON FOLLOWING  VANCOMYCIN  Final   Gram Stain   Final    ABUNDANT WBC PRESENT,BOTH PMN AND MONONUCLEAR NO ORGANISMS SEEN    Culture NO GROWTH 2 DAYS  Final   Report Status 09/20/2015 FINAL  Final  Culture, blood  (routine x 2)     Status: None   Collection Time: 09/17/15  9:29 PM  Result Value Ref Range Status   Specimen Description BLOOD BLOOD LEFT HAND  Final   Special Requests BOTTLES DRAWN AEROBIC AND ANAEROBIC 5CC  Final   Culture NO GROWTH 5 DAYS  Final   Report Status 09/22/2015 FINAL  Final  Culture, blood (routine x 2)     Status: None   Collection Time: 09/17/15  9:36 PM  Result Value Ref Range Status   Specimen Description BLOOD BLOOD LEFT HAND  Final   Special Requests BOTTLES DRAWN AEROBIC ONLY 5CC  Final   Culture NO GROWTH 5 DAYS  Final   Report Status 09/22/2015 FINAL  Final  Culture, Urine     Status: Abnormal   Collection Time: 09/21/15  9:35 AM  Result Value Ref Range Status   Specimen Description URINE, RANDOM  Final   Special Requests NONE  Final   Culture >=100,000 COLONIES/mL YEAST (A)  Final   Report Status 09/22/2015 FINAL  Final  Culture, blood (Routine X 2) w Reflex to ID Panel     Status: None (Preliminary result)   Collection Time: 09/21/15  9:40 AM  Result Value Ref Range Status   Specimen Description BLOOD BLOOD LEFT HAND  Final  Special Requests IN PEDIATRIC BOTTLE 3CC  Final   Culture NO GROWTH 3 DAYS  Final   Report Status PENDING  Incomplete  Culture, blood (Routine X 2) w Reflex to ID Panel     Status: None (Preliminary result)   Collection Time: 09/21/15  9:50 AM  Result Value Ref Range Status   Specimen Description BLOOD BLOOD LEFT ARM  Final   Special Requests IN PEDIATRIC BOTTLE 3CC  Final   Culture NO GROWTH 3 DAYS  Final   Report Status PENDING  Incomplete  Aerobic Culture (superficial specimen) (NOT AT [facility])     Status: None   Collection Time: 09/22/15 11:56 AM  Result Value Ref Range Status   Specimen Description ABDOMEN  Final   Special Requests Normal  Final   Gram Stain   Final    FEW WBC PRESENT,BOTH PMN AND MONONUCLEAR NO ORGANISMS SEEN    Culture NO GROWTH 2 DAYS  Final   Report Status 09/24/2015 FINAL  Final     Impression/Plan:  1. LVAD driveline infection - day 30/42 of MSSA antibiotics, most recently with cefazolin and also rifampin.  Initial infection was superficial to pump and walled off so pump exchange was not indicated.   2. Urine yeast - doubt significance.  Treating for 7 days.  3. C diff - on bid vancomycin and treat with that through 6/20 when he has completed above antibiotics.   4. dispo - to CIR when able.    Plan in place, I will sign off, please call with any questions.  Thanks!

## 2015-09-26 ENCOUNTER — Inpatient Hospital Stay (HOSPITAL_COMMUNITY): Payer: 59 | Admitting: Physical Therapy

## 2015-09-26 ENCOUNTER — Inpatient Hospital Stay (HOSPITAL_COMMUNITY): Payer: 59 | Admitting: Occupational Therapy

## 2015-09-26 DIAGNOSIS — N179 Acute kidney failure, unspecified: Secondary | ICD-10-CM

## 2015-09-26 DIAGNOSIS — I5022 Chronic systolic (congestive) heart failure: Secondary | ICD-10-CM

## 2015-09-26 DIAGNOSIS — R7881 Bacteremia: Secondary | ICD-10-CM

## 2015-09-26 DIAGNOSIS — R5381 Other malaise: Principal | ICD-10-CM

## 2015-09-26 DIAGNOSIS — L089 Local infection of the skin and subcutaneous tissue, unspecified: Secondary | ICD-10-CM

## 2015-09-26 DIAGNOSIS — Z95811 Presence of heart assist device: Secondary | ICD-10-CM

## 2015-09-26 DIAGNOSIS — T148 Other injury of unspecified body region: Secondary | ICD-10-CM

## 2015-09-26 DIAGNOSIS — B9561 Methicillin susceptible Staphylococcus aureus infection as the cause of diseases classified elsewhere: Secondary | ICD-10-CM

## 2015-09-26 DIAGNOSIS — T148XXA Other injury of unspecified body region, initial encounter: Secondary | ICD-10-CM

## 2015-09-26 LAB — COMPREHENSIVE METABOLIC PANEL
ALK PHOS: 265 U/L — AB (ref 38–126)
ALT: 9 U/L — ABNORMAL LOW (ref 17–63)
AST: 74 U/L — AB (ref 15–41)
Albumin: 1.8 g/dL — ABNORMAL LOW (ref 3.5–5.0)
Anion gap: 11 (ref 5–15)
BILIRUBIN TOTAL: 4.3 mg/dL — AB (ref 0.3–1.2)
BUN: 59 mg/dL — AB (ref 6–20)
CALCIUM: 8.6 mg/dL — AB (ref 8.9–10.3)
CO2: 20 mmol/L — ABNORMAL LOW (ref 22–32)
Chloride: 109 mmol/L (ref 101–111)
Creatinine, Ser: 6.78 mg/dL — ABNORMAL HIGH (ref 0.61–1.24)
GFR calc Af Amer: 10 mL/min — ABNORMAL LOW (ref >60)
GFR, EST NON AFRICAN AMERICAN: 9 mL/min — AB (ref >60)
Glucose, Bld: 84 mg/dL (ref 65–99)
POTASSIUM: 4.5 mmol/L (ref 3.5–5.1)
Sodium: 140 mmol/L (ref 135–145)
TOTAL PROTEIN: 6.7 g/dL (ref 6.5–8.1)

## 2015-09-26 LAB — CULTURE, BLOOD (ROUTINE X 2)
Culture: NO GROWTH
Culture: NO GROWTH

## 2015-09-26 LAB — GLUCOSE, CAPILLARY
GLUCOSE-CAPILLARY: 88 mg/dL (ref 65–99)
Glucose-Capillary: 89 mg/dL (ref 65–99)
Glucose-Capillary: 89 mg/dL (ref 65–99)

## 2015-09-26 LAB — CBC WITH DIFFERENTIAL/PLATELET
BASOS ABS: 0 10*3/uL (ref 0.0–0.1)
Basophils Relative: 0 %
Eosinophils Absolute: 0.2 10*3/uL (ref 0.0–0.7)
Eosinophils Relative: 2 %
HEMATOCRIT: 25.4 % — AB (ref 39.0–52.0)
HEMOGLOBIN: 8 g/dL — AB (ref 13.0–17.0)
LYMPHS PCT: 12 %
Lymphs Abs: 1.1 10*3/uL (ref 0.7–4.0)
MCH: 25.5 pg — ABNORMAL LOW (ref 26.0–34.0)
MCHC: 31.5 g/dL (ref 30.0–36.0)
MCV: 80.9 fL (ref 78.0–100.0)
MONO ABS: 0.9 10*3/uL (ref 0.1–1.0)
MONOS PCT: 9 %
NEUTROS ABS: 7.4 10*3/uL (ref 1.7–7.7)
Neutrophils Relative %: 77 %
Platelets: 208 10*3/uL (ref 150–400)
RBC: 3.14 MIL/uL — ABNORMAL LOW (ref 4.22–5.81)
RDW: 19.6 % — AB (ref 11.5–15.5)
WBC: 9.6 10*3/uL (ref 4.0–10.5)

## 2015-09-26 LAB — PROTIME-INR
INR: 2.31 — ABNORMAL HIGH (ref 0.00–1.49)
PROTHROMBIN TIME: 25.2 s — AB (ref 11.6–15.2)

## 2015-09-26 MED ORDER — WARFARIN SODIUM 4 MG PO TABS
4.0000 mg | ORAL_TABLET | Freq: Once | ORAL | Status: AC
  Administered 2015-09-26: 4 mg via ORAL
  Filled 2015-09-26: qty 1

## 2015-09-26 MED ORDER — FUROSEMIDE 80 MG PO TABS
80.0000 mg | ORAL_TABLET | Freq: Two times a day (BID) | ORAL | Status: DC
  Administered 2015-09-26 – 2015-09-29 (×7): 80 mg via ORAL
  Filled 2015-09-26 (×7): qty 1

## 2015-09-26 NOTE — Progress Notes (Signed)
Social Work  Social Work Assessment and Plan  Patient Details  Name: [name] MRN: [id number] Date of Birth: [birth year]  Today's Date: 09/26/2015  Problem List:  Patient Active Problem List   Diagnosis Date Noted  . Wound infection ([place])   . Debility 09/25/2015  . Debilitated 09/25/2015  . Chronic systolic congestive heart failure ([place])   . Colitis   . Septic shock ([place])   . Enteritis due to Clostridium difficile   . Staphylococcus aureus bacteremia   . 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[place])   . Abscess of abdominal wall   . Acute on chronic systolic (congestive) heart failure ([place])   . Left ventricular assist device (LVAD) complication [id number]  . Epistaxis 05/21/2015  . Situational depression 04/13/2015  . Acute gout 01/15/2015  . Palliative care encounter 12/17/2014  . Cardiogenic shock ([place])   . Acute on chronic systolic CHF (congestive heart failure) ([place])   . Acute on chronic renal failure ([place])   . Chronic atrial fibrillation ([place])   . Low output heart failure ([place]) 12/14/2014  . Acute abdominal pain   . Near syncope   . Hypotension 11/30/2014  . AKI (acute kidney injury) ([place])   . Abnormal LFTs 10/26/2012  . Hyperthyroidism 05/26/2011  . 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 ICD-[employer] 07/22/2010  . Nonischemic cardiomyopathy ([place]) 07/22/2010  . Essential hypertension, benign 11/21/2008  . SYSTOLIC HEART FAILURE, CHRONIC 10/23/2008   Past Medical History:  Past Medical History  Diagnosis Date  . Chronic systolic heart failure (HCC)     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%)  . Atrial fibrillation -parosysmal      Rx w amiodarone  . Noncompliance     H/O  MEDICAL NONCOMPLIANCE  . 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 Tricuspid valve regurgitation     SEVERE  . Severe mitral regurgitation   . Polymorphic ventricular tachycardia (HCC)     RECURRENT WITH  APPROPRIATE SHOCK THERAPY IN THE PAST  . Ventricular fibrillation (HCC)     WITH APPROPRIATE SHOCK THERAPY IN THE PAST  . Hypertension   . Gout   . RA (rheumatoid arthritis) ([place])   . Automatic implantable cardiac defibrillator -BSX     single chamber  . GI bleed -massive     11 Units 2012  . Elevated LFTs   . H/O hyperthyroidism   . CHF (congestive heart failure) ([place])   . AKI (acute kidney injury) ([place])   . AICD (automatic cardioverter/defibrillator) present    Past Surgical History:  Past Surgical History  Procedure Laterality Date  . Cardiac catheterization  06/2006    RIGHT HEART CATH SHOWING SEVERE BIVENTRICUALR CHF WITH MARKED FILLING AND PRESSURES  . Insert / replace / remove pacemaker      GUIDANT HE ICD MODEL 2180, SERIAL # [id number]  . Cholecystectomy    . Implantable cardioverter defibrillator generator change N/A 07/01/2011    Procedure: IMPLANTABLE CARDIOVERTER DEFIBRILLATOR GENERATOR CHANGE;  Surgeon: Deboraha Sprang, MD;  Location: [facility] CATH LAB;  Service: Cardiovascular;  Laterality: N/A;  . Tee without cardioversion N/A 12/12/2014    Procedure: TRANSESOPHAGEAL ECHOCARDIOGRAM (TEE);  Surgeon: Jerline Pain, MD;  Location: [place];  Service: Cardiovascular;  Laterality: N/A;  . Cardiac catheterization N/A 12/14/2014    Procedure: Right Heart Cath;  Surgeon: Jolaine Artist, MD;  Location: [place] CV LAB;  Service: Cardiovascular;  Laterality: N/A;  . Cardiac catheterization N/A  12/14/2014    Procedure: IABP Insertion;  Surgeon: Jolaine Artist, MD;  Location: [place] CV LAB;  Service: Cardiovascular;  Laterality: N/A;  . Insertion of implantable left ventricular assist device N/A 12/20/2014    Procedure: INSERTION OF IMPLANTABLE LEFT VENTRICULAR ASSIST DEVICE;  Surgeon: Ivin Poot, MD;  Location: [place];  Service: Open Heart Surgery;  Laterality: N/A;  CIRC ARREST  NITRIC OXIDE  . Tee without cardioversion N/A 12/20/2014    Procedure: TRANSESOPHAGEAL  ECHOCARDIOGRAM (TEE);  Surgeon: Ivin Poot, MD;  Location: [place];  Service: Open Heart Surgery;  Laterality: N/A;  . Sternal wound debridement N/A 08/27/2015    Procedure: WOUND IRRIGATION AND DEBRIDEMENT;  Surgeon: Ivin Poot, MD;  Location: [place];  Service: Thoracic;  Laterality: N/A;  . Application of wound vac N/A 08/27/2015    Procedure: APPLICATION OF WOUND VAC;  Surgeon: Ivin Poot, MD;  Location: [place];  Service: Thoracic;  Laterality: N/A;  . Sternal wound debridement N/A 08/29/2015    Procedure: DEBRIDEMENT OF chest wound;  Surgeon: Ivin Poot, MD;  Location: [place];  Service: Thoracic;  Laterality: N/A;  . Application of wound vac N/A 08/29/2015    Procedure: WOUND VAC CHANGE;  Surgeon: Ivin Poot, MD;  Location: [place];  Service: Thoracic;  Laterality: N/A;  . Incision and drainage of wound N/A 09/05/2015    Procedure: IRRIGATION AND DEBRIDEMENT sternal WOUND;  Surgeon: Loel Lofty Dillingham, DO;  Location: [place];  Service: Plastics;  Laterality: N/A;  . Application of a-cell of extremity N/A 09/05/2015    Procedure: APPLICATION OF A-CELL OF sternum;  Surgeon: Loel Lofty Dillingham, DO;  Location: [place];  Service: Plastics;  Laterality: N/A;  . Application of wound vac N/A 09/05/2015    Procedure: APPLICATION OF WOUND VAC to sternum;  Surgeon: Loel Lofty Dillingham, DO;  Location: [place];  Service: Plastics;  Laterality: N/A;  . Insertion of dialysis catheter Right 09/05/2015    Procedure: INSERTION OF DIALYSIS CATHETER;RIGHT SUBCLAVIAN;  Surgeon: Loel Lofty Dillingham, DO;  Location: [place];  Service: Plastics;  Laterality: Right;  . Sternal closure N/A 09/17/2015    Procedure: ABDOMINAL WOUND CLOSURE WITH INCISIONAL VAC APPLICATION;  Surgeon: Ivin Poot, MD;  Location: [place];  Service: Thoracic;  Laterality: N/A;  . Esophagogastroduodenoscopy N/A 09/17/2015    Procedure: ESOPHAGOGASTRODUODENOSCOPY (EGD);  Surgeon: Carol Ada, MD;  Location: [place];  Service: Gastroenterology;   Laterality: N/A;  . Pectoralis flap N/A 09/09/2015    Procedure: DEBRIDEMENT AND CLOSURE WOUND WITH PLACEMENT OF ABRA CLOSURE DEVICE;  Surgeon: Loel Lofty Dillingham, DO;  Location: [place];  Service: Plastics;  Laterality: N/A;   Social History:  reports that he has never smoked. He has never used smokeless tobacco. He reports that he does not drink alcohol or use illicit drugs.  Family / Support Systems Marital Status: Single Patient Roles: Other (Comment) (Employee & Siblings) Other Supports: Charles-brother in [place] [phone]   Brimhall Nizhoni in [place] caring for Stewart [phone] Anticipated Caregiver: Pam Ability/Limitations of Caregiver: Pam to come and stay with short time Caregiver Availability: 24/7 (short time someone with him) Family Dynamics: Close knit family-Mom recently passed away after a long illness. His siblings and friends are supportive and involved. He has come a long way from where he started and he is feeling like his self again.   Social History Preferred language: English Religion: Baptist Cultural Background: No issues Education: Some  college Read: Yes Write: Yes Employment Status: Employed Name of Employer: Personal assistant Return to Work Plans: Plans to return when he is able Freight forwarder Issues: No issues Guardian/Conservator: none-according to MD pt is capable of making his own decisions while here   Abuse/Neglect Physical Abuse: Denies Verbal Abuse: Denies Sexual Abuse: Denies Exploitation of patient/patient's resources: Denies Self-Neglect: Denies  Emotional Status Pt's affect, behavior adn adjustment status: Pt is motivated to get better and can finally see the light at the end of the tunnel. He feels better and is moving with less effort, he still becomes tired easlily but this is getting better also. He hopes to go home soon and be reunited with his puppy. Recent  Psychosocial Issues: other health issues-LVAD Pyschiatric History: History of depression and has had his low times here after the recent death of his Mother. He feels he is doing better now. Offered neuro-psych to see while here he will think about this and get back with this worker. Substance Abuse History: No issues  Patient / Family Perceptions, Expectations & Goals Pt/Family understanding of illness & functional limitations: Pt is able to explain his illnesses and treatment plan. He does speak with the MD's and feels his questions and concerns are being addressed. He just wants to continue to improve while here. Premorbid pt/family roles/activities: Employee, Friend, brother, chruch member, etc Anticipated changes in roles/activities/participation: resume Pt/family expectations/goals: Pt states: " I want to be able to take care of myself by the time I leave here. I think I can get there."    [employer]: Other (Comment) (Followed by the [facility]) Premorbid Home Care/DME Agencies: Other (Comment) (had in past) Transportation available at discharge: Family and Friends Resource referrals recommended: Neuropsychology, Support group (specify)  Discharge Planning Living Arrangements: Alone Support Systems: Other relatives, Water engineer, Social worker community Type of Residence: Private residence Insurance Resources: Multimedia programmer (specify) Sports administrator) Financial Resources: Employment Financial Screen Referred: No Living Expenses: Own Money Management: Patient Does the patient have any problems obtaining your medications?: No Home Management: self Patient/Family Preliminary Plans: Return home with sister-Pam coming to stay with a short time until he is comfortable alone. His brother and friends will be checking in on him and making sure he has what he needs. Hopes to only be here a short time. Social Work Anticipated Follow Up Needs: HH/OP, Support  Group  Clinical Impression Pleasant gentleman who is doing well and recovering from his infections and regaining his strength. His family is supportive and involved and will assist him upon discharge. Should be her ea short time since doing well. Will work on discharge needs, may need IV antibiotics for a short time at home if goes home before 6/20. Provide support and will get back with worker regarding seeing neuro-psych while here.  Elease Hashimoto 09/26/2015, 3:25 PM

## 2015-09-26 NOTE — Evaluation (Signed)
Physical Therapy Assessment and Plan  Patient Details  Name: [name] MRN: [id number] Date of Birth: 1970-07-10  PT Diagnosis: Abnormality of gait, Difficulty walking and Muscle weakness Rehab Potential: ExcellentExcellcent ELOS: 7 days  7 days   Today's Date: 09/26/2015 PT Individual Time: 0900-1000 PT Individual Time Calculation (min): 60 min    Problem List:  Patient Active Problem List   Diagnosis Date Noted  . Wound infection ([place])   . Debility 09/25/2015  . Debilitated 09/25/2015  . Chronic systolic congestive heart failure ([place])   . Colitis   . Septic shock ([place])   . Enteritis due to Clostridium difficile   . Staphylococcus aureus bacteremia   . 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[place])   . Abscess of abdominal wall   . Acute on chronic systolic (congestive) heart failure ([place])   . Left ventricular assist device (LVAD) complication 00/76/2263  . Epistaxis 05/21/2015  . Situational depression 04/13/2015  . Acute gout 01/15/2015  . Palliative care encounter 12/17/2014  . Cardiogenic shock ([place])   . Acute on chronic systolic CHF (congestive heart failure) ([place])   . Acute on chronic renal failure ([place])   . Chronic atrial fibrillation ([place])   . Low output heart failure ([place]) 12/14/2014  . Acute abdominal pain   . Near syncope   . Hypotension 11/30/2014  . AKI (acute kidney injury) ([place])   . Abnormal LFTs 10/26/2012  . Hyperthyroidism 05/26/2011  . 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 ICD-[employer] 07/22/2010  . Nonischemic cardiomyopathy ([place]) 07/22/2010  . Essential hypertension, benign 11/21/2008  . SYSTOLIC HEART FAILURE, CHRONIC 10/23/2008    Past Medical History:  Past Medical History  Diagnosis Date  . Chronic systolic heart failure (HCC)     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%)  . Atrial fibrillation -parosysmal      Rx w amiodarone  . Noncompliance     H/O  MEDICAL NONCOMPLIANCE  . 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 Tricuspid valve regurgitation     SEVERE  . Severe mitral regurgitation   . Polymorphic ventricular tachycardia (HCC)     RECURRENT WITH APPROPRIATE SHOCK THERAPY IN THE PAST  . Ventricular fibrillation (HCC)     WITH APPROPRIATE SHOCK THERAPY IN THE PAST  . Hypertension   . Gout   . RA (rheumatoid arthritis) ([place])   . Automatic implantable cardiac defibrillator -BSX     single chamber  . GI bleed -massive     11 Units 2012  . Elevated LFTs   . H/O hyperthyroidism   . CHF (congestive heart failure) ([place])   . AKI (acute kidney injury) ([place])   . AICD (automatic cardioverter/defibrillator) present    Past Surgical History:  Past Surgical History  Procedure Laterality Date  . Cardiac catheterization  06/2006    RIGHT HEART CATH SHOWING SEVERE BIVENTRICUALR CHF WITH MARKED FILLING AND PRESSURES  . Insert / replace / remove pacemaker      GUIDANT HE ICD MODEL 2180, SERIAL # [id number]  . Cholecystectomy    . Implantable cardioverter defibrillator generator change N/A 07/01/2011    Procedure: IMPLANTABLE CARDIOVERTER DEFIBRILLATOR GENERATOR CHANGE;  Surgeon: Deboraha Sprang, MD;  Location: [facility] CATH LAB;  Service: Cardiovascular;  Laterality: N/A;  . Tee without cardioversion N/A 12/12/2014    Procedure: TRANSESOPHAGEAL ECHOCARDIOGRAM (TEE);  Surgeon: Jerline Pain, MD;  Location: [facility] ENDOSCOPY;  Service: Cardiovascular;  Laterality: N/A;  . Cardiac catheterization  N/A 12/14/2014    Procedure: Right Heart Cath;  Surgeon: Jolaine Artist, MD;  Location: [place] CV LAB;  Service: Cardiovascular;  Laterality: N/A;  . Cardiac catheterization N/A 12/14/2014    Procedure: IABP Insertion;  Surgeon: Jolaine Artist, MD;  Location: [place] CV LAB;  Service: Cardiovascular;  Laterality: N/A;  . Insertion of implantable left ventricular assist device N/A 12/20/2014    Procedure: INSERTION OF IMPLANTABLE LEFT VENTRICULAR ASSIST DEVICE;   Surgeon: Ivin Poot, MD;  Location: [place];  Service: Open Heart Surgery;  Laterality: N/A;  CIR[facility] ARREST  NITRIC OXIDE  . Tee without cardioversion N/A 12/20/2014    Procedure: TRANSESOPHAGEAL ECHOCARDIOGRAM (TEE);  Surgeon: Ivin Poot, MD;  Location: [place];  Service: Open Heart Surgery;  Laterality: N/A;  . Sternal wound debridement N/A 08/27/2015    Procedure: WOUND IRRIGATION AND DEBRIDEMENT;  Surgeon: Ivin Poot, MD;  Location: [place];  Service: Thoracic;  Laterality: N/A;  . Application of wound vac N/A 08/27/2015    Procedure: APPLICATION OF WOUND VAC;  Surgeon: Ivin Poot, MD;  Location: [place];  Service: Thoracic;  Laterality: N/A;  . Sternal wound debridement N/A 08/29/2015    Procedure: DEBRIDEMENT OF chest wound;  Surgeon: Ivin Poot, MD;  Location: [place];  Service: Thoracic;  Laterality: N/A;  . Application of wound vac N/A 08/29/2015    Procedure: WOUND VAC CHANGE;  Surgeon: Ivin Poot, MD;  Location: [place];  Service: Thoracic;  Laterality: N/A;  . Incision and drainage of wound N/A 09/05/2015    Procedure: IRRIGATION AND DEBRIDEMENT sternal WOUND;  Surgeon: Loel Lofty Dillingham, DO;  Location: [place];  Service: Plastics;  Laterality: N/A;  . Application of a-cell of extremity N/A 09/05/2015    Procedure: APPLICATION OF A-CELL OF sternum;  Surgeon: Loel Lofty Dillingham, DO;  Location: [place];  Service: Plastics;  Laterality: N/A;  . Application of wound vac N/A 09/05/2015    Procedure: APPLICATION OF WOUND VAC to sternum;  Surgeon: Loel Lofty Dillingham, DO;  Location: [place];  Service: Plastics;  Laterality: N/A;  . Insertion of dialysis catheter Right 09/05/2015    Procedure: INSERTION OF DIALYSIS CATHETER;RIGHT SUBCLAVIAN;  Surgeon: Loel Lofty Dillingham, DO;  Location: [place];  Service: Plastics;  Laterality: Right;  . Sternal closure N/A 09/17/2015    Procedure: ABDOMINAL WOUND CLOSURE WITH INCISIONAL VAC APPLICATION;  Surgeon: Ivin Poot, MD;  Location: [place];   Service: Thoracic;  Laterality: N/A;  . Esophagogastroduodenoscopy N/A 09/17/2015    Procedure: ESOPHAGOGASTRODUODENOSCOPY (EGD);  Surgeon: Carol Ada, MD;  Location: [place];  Service: Gastroenterology;  Laterality: N/A;  . Pectoralis flap N/A 09/09/2015    Procedure: DEBRIDEMENT AND CLOSURE WOUND WITH PLACEMENT OF ABRA CLOSURE DEVICE;  Surgeon: Loel Lofty Dillingham, DO;  Location: [place];  Service: Plastics;  Laterality: N/A;    Assessment & Plan Clinical Impression: Patient is a 45 y.o. year old male with history of systolic congestive heart failure, LVAD 12/20/2014, atrial fibrillation maintained on Coumadin, medical noncompliance. Patient lives alone in [place]. Independent prior to admission. He has a brother in [place]. Recently traveled to [place] to see his mother and developed fever chills and abdominal pain. Went to [facility] in [place]. Initially felt to have pyelonephritis but CT scan negative. Found to have C. difficile colitis placed on oral vancomycin. Developed subxiphoid swelling and subsequent had a CT which showed large abscess surrounding driveline at LVAD site. Blood cultures  showed MSSA. Seen by infectious disease started on nafcillin and transferred to [facility] for further management. Since initial infection was superficial to pump and walled off so pump exchange was not indicated. Patient underwent irrigation and debridement of chest sternal wound 09/09/2015 with closure of abdominal wound 09/18/2015 per Dr. Nils Pyle. TEE showed no vegetation on the valves were ICD wire. Presently remains on cefazolin and rifampin day 30/42 completed. Hospital course ileus and diet slowly advanced with nasogastric tube remaining in place for a short time. Bouts of nausea vomiting felt to be related to antibiotic and rifampin or change to intravenous due to nausea. He remains on twice a day vancomycin for C. difficile through 10/08/2015. Nephrology  consulted 09/03/2015 for AKI elevated 1.40-3.89-7.13. Renal ultrasound negative. Hemodialysis initiated. Physical therapy evaluation completed with recommendations to physical medicine rehabilitation consult. Patient was admitted for a comprehensive rehabilitation program  Patient transferred to CIR on 09/25/2015 .   Patient currently requires min with mobility secondary to muscle weakness and decreased cardiorespiratoy endurance.  Prior to hospitalization, patient was independent  with mobility and lived with Alone in a House home.  Home access is 1Stairs to enter.  Patient will benefit from skilled PT intervention to maximize safe functional mobility and minimize fall risk for planned discharge home alone.  Anticipate patient will benefit from follow up OP at discharge.  PT - End of Session Activity Tolerance: Improving;Tolerates 30+ min activity with multiple rests Endurance Deficit: Yes PT Assessment Rehab Potential (ACUTE/IP ONLY): Excellent PT Patient demonstrates impairments in the following area(s): Balance;Endurance PT Transfers Functional Problem(s): Bed to Chair;Bed Mobility;Car;Floor PT Locomotion Functional Problem(s): Ambulation;Stairs PT Plan PT Intensity: Minimum of 1-2 x/day ,45 to 90 minutes PT Frequency: 5 out of 7 days PT Duration Estimated Length of Stay: 7 days  PT Treatment/Interventions: Ambulation/gait training;Balance/vestibular training;Discharge planning;Neuromuscular re-education;UE/LE Coordination activities;UE/LE Strength taining/ROM;Therapeutic Exercise;Therapeutic Activities;Patient/family education;Stair training;DME/adaptive equipment instruction;Functional mobility training;Community reintegration PT Transfers Anticipated Outcome(s): independent PT Locomotion Anticipated Outcome(s): independent  PT Recommendation Follow Up Recommendations: Outpatient PT;Home health PT Patient destination: Home Equipment Recommended: None recommended by PT;Other (comment)  (will continue to assess; None currently )  Skilled Therapeutic Intervention PT eval completed with discussion of rehab process, plan of care and goal discussion. Discussion regarding energy conservation techniques. Session focused on functional mobility and balance. Patient required short rest breaks secondary to decreased cardiovascular endurance. Patient initially close supervision for all functional mobility task however as patient fatigues min assist required. Patient provided education and scanning strategies for environmental awareness and obstacle negotiation.    PT Evaluation Precautions/Restrictions Precautions Precautions: Fall Precaution Comments: LVAD Restrictions Weight Bearing Restrictions: No General Chart Reviewed: Yes Family/Caregiver Present: No Vital Signs  Pain Pain Assessment Pain Assessment: No/denies pain Home Living/Prior Functioning Home Living Available Help at Discharge: Family;Available 24 hours/day (siblings can provide assist if necessary) Type of Home: House Home Access: Stairs to enter [employer] of Steps: 1 Entrance Stairs-Rails: None Home Layout: One level Bathroom Shower/Tub: Chiropodist: Standard Bathroom Accessibility: Yes  Lives With: Alone Prior Function Level of Independence: Independent with gait;Independent with basic ADLs;Independent with transfers;Independent with homemaking with ambulation  Able to Take Stairs?: Reciprically Driving: Yes Vocation: Full time employment  Cognition Overall Cognitive Status: Within Functional Limits for tasks assessed Arousal/Alertness: Awake/alert Orientation Level: Oriented X4 Attention: Alternating Alternating Attention: Appears intact Memory: Appears intact Awareness: Appears intact Problem Solving: Appears intact Safety/Judgment: Appears intact Sensation Sensation Light Touch: Appears Intact Stereognosis: Not tested Hot/Cold: Not tested  Proprioception:  Appears Intact Coordination Fine Motor Movements are Fluid and Coordinated: Yes Finger Nose Finger Test: [facility] Motor  Motor Motor: Within Functional Limits  Mobility Bed Mobility Bed Mobility: Rolling Right;Rolling Left Rolling Right: 5: Supervision Rolling Right Details: Verbal cues for sequencing;Verbal cues for technique Rolling Left: 5: Supervision Rolling Left Details: Verbal cues for technique;Verbal cues for sequencing Transfers Transfers: Yes Sit to Stand: 5: Supervision Sit to Stand Details: Verbal cues for technique Sit to Stand Details (indicate cue type and reason): verbal cues for controlled descent and positoning  Stand to Sit: 5: Supervision Stand to Sit Details (indicate cue type and reason): Verbal cues for technique Stand to Sit Details: verbal cues for controlled descent and positoning  Stand Pivot Transfers: 5: Supervision Stand Pivot Transfer Details: Verbal cues for technique Stand Pivot Transfer Details (indicate cue type and reason): verbal cues for controlled descent and positoning  Locomotion  Ambulation Ambulation: Yes Ambulation/Gait Assistance: 5: Supervision;4: Min guard Ambulation Distance (Feet): 150 Feet Assistive device: None Ambulation/Gait Assistance Details: Verbal cues for gait pattern;Manual facilitation for weight shifting Ambulation/Gait Assistance Details: Verbal cues required for step and stride length; min assist required for croweded environments obstacle negotitaion and cues for environmental awareness  Gait Gait: Yes Gait Pattern: Impaired Gait Pattern: Decreased stride length;Decreased trunk rotation;Step-through pattern Gait velocity: decreased cadence with increased postural sway Stairs / Additional Locomotion Stairs: Yes Stairs Assistance: 4: Min guard Stair Management Technique: One rail Left;Step to pattern Number of Stairs: 12 Ramp: 5: Supervision Curb: 4: Min Chemical engineer: No   Balance Balance Balance Assessed: Yes Standardized Balance Assessment Standardized Balance Assessment: Furniture conservator/restorer Berg Balance Test Sit to Stand: Able to stand  independently using hands Standing Unsupported: Able to stand safely 2 minutes Sitting with Back Unsupported but Feet Supported on Floor or Stool: Able to sit safely and securely 2 minutes Stand to Sit: Controls descent by using hands Transfers: Able to transfer safely, definite need of hands Standing Unsupported with Eyes Closed: Able to stand 10 seconds safely Standing Ubsupported with Feet Together: Able to place feet together independently and stand 1 minute safely From Standing, Reach Forward with Outstretched Arm: Can reach confidently >25 cm (10") From Standing Position, Pick up Object from Floor: Able to pick up shoe, needs supervision From Standing Position, Turn to Look Behind Over each Shoulder: Turn sideways only but maintains balance Turn 360 Degrees: Able to turn 360 degrees safely but slowly Standing Unsupported, Alternately Place Feet on Step/Stool: Able to complete >2 steps/needs minimal assist Standing Unsupported, One Foot in Front: Able to take small step independently and hold 30 seconds Standing on One Leg: Tries to lift leg/unable to hold 3 seconds but remains standing independently Total Score: 40 Static Sitting Balance Static Sitting - Level of Assistance: 7: Independent Dynamic Sitting Balance Dynamic Sitting - Level of Assistance: 7: Independent Static Standing Balance Static Standing - Balance Support: No upper extremity supported Static Standing - Level of Assistance: 7: Independent Dynamic Standing Balance Dynamic Standing - Balance Support: No upper extremity supported;During functional activity Dynamic Standing - Level of Assistance: 4: Min assist;5: Stand by assistance Dynamic Standing - Balance Activities: Lateral lean/weight shifting;Forward lean/weight shifting Dynamic Standing -  Comments: supervision for minimum excursion mod excursion min assist.  Extremity Assessment  RUE Assessment RUE Assessment: Within Functional Limits (ROM WFL, strength grossly 4/5) LUE Assessment LUE Assessment: Within Functional Limits (ROM WFL, strength grossly 4/5) RLE Assessment RLE Assessment: Exceptions to [facility] RLE Strength  RLE Overall Strength Comments: grossly 4/5 throughout LLE Assessment LLE Assessment: Exceptions to [place] LLE Strength LLE Overall Strength Comments: grossly 4/5 throughout    See Function Navigator for Current Functional Status.   Refer to Care Plan for Long Term Goals  Recommendations for other services: None  Discharge Criteria: Patient will be discharged from PT if patient refuses treatment 3 consecutive times without medical reason, if treatment goals not met, if there is a change in medical status, if patient makes no progress towards goals or if patient is discharged from hospital.  The above assessment, treatment plan, treatment alternatives and goals were discussed and mutually agreed upon: by patient  Retta Diones 09/26/2015, 2:19 PM

## 2015-09-26 NOTE — H&P (View-Only) (Signed)
  Physical Medicine and Rehabilitation Admission H&P    Chief complaint: Weakness   HPI: [name] is a 45 y.o. right handed male with history of systolic congestive heart failure, LVAD 12/20/2014, atrial fibrillation maintained on Coumadin, medical noncompliance. Patient lives alone in [place]. Independent prior to admission. He has a brother in [place]. Recently traveled to [place] to see his mother and developed fever chills and abdominal pain. Went to [facility] in [place]. Initially felt to have pyelonephritis but CT scan negative. Found to have C. difficile colitis placed on oral vancomycin. Developed subxiphoid swelling and subsequent had a CT which showed large abscess surrounding driveline at LVAD site. Blood cultures showed MSSA. Seen by infectious disease started on nafcillin and transferred to [facility] for further management. Since initial infection was superficial to pump and walled off so pump exchange was not indicated.  Patient underwent irrigation and debridement of chest sternal wound 09/09/2015 with closure of abdominal wound 09/18/2015 per Dr. Van Tright. TEE showed no vegetation on the valves were ICD wire. Presently remains on cefazolin and rifampin day 30/42 completed. Hospital course ileus and diet slowly advanced with nasogastric tube remaining in place for a short time. Bouts of nausea vomiting felt to be related to antibiotic and rifampin or change to intravenous due to nausea. He remains on twice a day vancomycin for C. difficile through 10/08/2015. Nephrology consulted 09/03/2015 for AKI elevated 1.40-3.89-7.13. Renal ultrasound negative. Hemodialysis initiated. Physical therapy evaluation completed with recommendations to physical medicine rehabilitation consult. Patient was admitted for a comprehensive rehabilitation program  ROS Constitutional: Positive for fever and chills.  HENT: Negative for hearing loss.  Eyes:  Negative for blurred vision and double vision.  Respiratory: Positive for shortness of breath. Negative for cough.  Cardiovascular: Positive for palpitations and leg swelling. Negative for chest pain.  Gastrointestinal: Positive for nausea and constipation. Negative for vomiting.  Musculoskeletal: Positive for myalgias.  Neurological: Positive for weakness. Negative for seizures and headaches.  All other systems reviewed and are negative   Past Medical History  Diagnosis Date  . Chronic systolic heart failure (HCC)     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%)  . Atrial fibrillation -parosysmal      Rx w amiodarone  . Noncompliance     H/O  MEDICAL NONCOMPLIANCE  . 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 Tricuspid valve regurgitation     SEVERE  . Severe mitral regurgitation   . Polymorphic ventricular tachycardia (HCC)     RECURRENT WITH APPROPRIATE SHOCK THERAPY IN THE PAST  . Ventricular fibrillation (HCC)     WITH APPROPRIATE SHOCK THERAPY IN THE PAST  . Hypertension   . Gout   . RA (rheumatoid arthritis) (HCC)   . Automatic implantable cardiac defibrillator -BSX     single chamber  . GI bleed -massive     11 Units 2012  . Elevated LFTs   . H/O hyperthyroidism   . CHF (congestive heart failure) (HCC)   . AKI (acute kidney injury) (HCC)   . AICD (automatic cardioverter/defibrillator) present    Past Surgical History  Procedure Laterality Date  . Cardiac catheterization  06/2006    RIGHT HEART CATH SHOWING SEVERE BIVENTRICUALR CHF WITH MARKED FILLING AND PRESSURES  . Insert / replace / remove pacemaker      GUIDANT HE ICD MODEL 2180, SERIAL # [id number]  . Cholecystectomy    . Implantable cardioverter defibrillator   generator change N/A 07/01/2011    Procedure: IMPLANTABLE CARDIOVERTER DEFIBRILLATOR GENERATOR CHANGE;  Surgeon: Deboraha Sprang, MD;  Location: [facility] CATH LAB;  Service: Cardiovascular;  Laterality: N/A;  . Tee  without cardioversion N/A 12/12/2014    Procedure: TRANSESOPHAGEAL ECHOCARDIOGRAM (TEE);  Surgeon: Jerline Pain, MD;  Location: [facility];  Service: Cardiovascular;  Laterality: N/A;  . Cardiac catheterization N/A 12/14/2014    Procedure: Right Heart Cath;  Surgeon: Jolaine Artist, MD;  Location: [facility] CV LAB;  Service: Cardiovascular;  Laterality: N/A;  . Cardiac catheterization N/A 12/14/2014    Procedure: IABP Insertion;  Surgeon: Jolaine Artist, MD;  Location: [place] CV LAB;  Service: Cardiovascular;  Laterality: N/A;  . Insertion of implantable left ventricular assist device N/A 12/20/2014    Procedure: INSERTION OF IMPLANTABLE LEFT VENTRICULAR ASSIST DEVICE;  Surgeon: Ivin Poot, MD;  Location: [facility];  Service: Open Heart Surgery;  Laterality: N/A;  CIRC ARREST  NITRIC OXIDE  . Tee without cardioversion N/A 12/20/2014    Procedure: TRANSESOPHAGEAL ECHOCARDIOGRAM (TEE);  Surgeon: Ivin Poot, MD;  Location: [facility];  Service: Open Heart Surgery;  Laterality: N/A;  . Sternal wound debridement N/A 08/27/2015    Procedure: WOUND IRRIGATION AND DEBRIDEMENT;  Surgeon: Ivin Poot, MD;  Location: [facility];  Service: Thoracic;  Laterality: N/A;  . Application of wound vac N/A 08/27/2015    Procedure: APPLICATION OF WOUND VAC;  Surgeon: Ivin Poot, MD;  Location: [facility];  Service: Thoracic;  Laterality: N/A;  . Sternal wound debridement N/A 08/29/2015    Procedure: DEBRIDEMENT OF chest wound;  Surgeon: Ivin Poot, MD;  Location: [facility];  Service: Thoracic;  Laterality: N/A;  . Application of wound vac N/A 08/29/2015    Procedure: WOUND VAC CHANGE;  Surgeon: Ivin Poot, MD;  Location: [facility];  Service: Thoracic;  Laterality: N/A;  . Incision and drainage of wound N/A 09/05/2015    Procedure: IRRIGATION AND DEBRIDEMENT sternal WOUND;  Surgeon: Loel Lofty Dillingham, DO;  Location: [facility];  Service: Plastics;  Laterality: N/A;  . Application of a-cell of extremity N/A  09/05/2015    Procedure: APPLICATION OF A-CELL OF sternum;  Surgeon: Loel Lofty Dillingham, DO;  Location: [facility];  Service: Plastics;  Laterality: N/A;  . Application of wound vac N/A 09/05/2015    Procedure: APPLICATION OF WOUND VAC to sternum;  Surgeon: Loel Lofty Dillingham, DO;  Location: [facility];  Service: Plastics;  Laterality: N/A;  . Insertion of dialysis catheter Right 09/05/2015    Procedure: INSERTION OF DIALYSIS CATHETER;RIGHT SUBCLAVIAN;  Surgeon: Loel Lofty Dillingham, DO;  Location: [facility];  Service: Plastics;  Laterality: Right;  . Sternal closure N/A 09/17/2015    Procedure: ABDOMINAL WOUND CLOSURE WITH INCISIONAL VAC APPLICATION;  Surgeon: Ivin Poot, MD;  Location: [facility];  Service: Thoracic;  Laterality: N/A;  . Esophagogastroduodenoscopy N/A 09/17/2015    Procedure: ESOPHAGOGASTRODUODENOSCOPY (EGD);  Surgeon: Carol Ada, MD;  Location: [facility];  Service: Gastroenterology;  Laterality: N/A;  . Pectoralis flap N/A 09/09/2015    Procedure: DEBRIDEMENT AND CLOSURE WOUND WITH PLACEMENT OF ABRA CLOSURE DEVICE;  Surgeon: Loel Lofty Dillingham, DO;  Location: [facility];  Service: Plastics;  Laterality: N/A;   Family History  Problem Relation Age of Onset  . Heart failure Brother    Social History:  reports that he has never smoked. He has never used smokeless tobacco. He reports that he does not drink alcohol or use illicit  drugs. Allergies:  Allergies  Allergen Reactions  . Phytonadione Other (See Comments)    Patient has LVAD: please check with LVAD coordinator on call or LVAD MD on call before reversal of anticoagulation with vit k   Medications Prior to Admission  Medication Sig Dispense Refill  . allopurinol (ZYLOPRIM) 300 MG tablet Take 1 tablet (300 mg total) by mouth daily. 30 tablet 6  . amiodarone (PACERONE) 200 MG tablet Take 0.5 tablets (100 mg total) by mouth daily. 30 tablet 6  . aspirin EC 81 MG tablet Take 81 mg by mouth daily.    . citalopram (CELEXA) 20 MG tablet Take 1  tablet (20 mg total) by mouth daily. 30 tablet 6  . colchicine 0.6 MG tablet Take 1 tablet (0.6 mg total) by mouth 2 (two) times daily as needed (as needed for gout pain). 30 tablet 6  . digoxin (LANOXIN) 0.125 MG tablet Take 1 tablet (0.125 mg total) by mouth daily. 30 tablet 6  . ferrous sulfate 325 (65 FE) MG tablet Take 1 tablet (325 mg total) by mouth 2 (two) times daily with a meal. 60 tablet 3  . furosemide (LASIX) 40 MG tablet Take 40 mg by mouth 2 (two) times a week.  0  . hydrALAZINE (APRESOLINE) 100 MG tablet Take 1 tablet (100 mg total) by mouth every 8 (eight) hours. 90 tablet 3  . lisinopril (PRINIVIL,ZESTRIL) 20 MG tablet Take 1 tablet (20 mg total) by mouth daily. 30 tablet 6  . [EXPIRED] nystatin (MYCOSTATIN) 100000 UNIT/ML suspension Take 5 mLs by mouth 2 (two) times daily.    . pantoprazole (PROTONIX) 40 MG tablet Take 1 tablet (40 mg total) by mouth daily. 30 tablet 6  . potassium chloride SA (K-DUR,KLOR-CON) 20 MEQ tablet Take 40 mEq by mouth 2 (two) times a week.    . pregabalin (LYRICA) 75 MG capsule Take 1 capsule (75 mg total) by mouth 2 (two) times daily. 60 capsule 11  . sildenafil (REVATIO) 20 MG tablet Take 1 tablet (20 mg total) by mouth 3 (three) times daily. 90 tablet 6  . spironolactone (ALDACTONE) 25 MG tablet Take 0.5 tablets (12.5 mg total) by mouth daily. 90 tablet 3  . vancomycin (VANCOCIN) 125 MG capsule Take by mouth.     . warfarin (COUMADIN) 2 MG tablet Take 4 mg daily except 6 mg on Saturdays: may adjust just as needed per [facility] (Patient taking differently: Take 4 mg by mouth daily at 6 PM. ) 90 tablet 6    Home: Home Living Family/patient expects to be discharged to:: Private residence Living Arrangements: Alone Available Help at Discharge: Family, Available 24 hours/day (siblings will assist) Type of Home: House Home Access: Level entry Home Layout: One level Bathroom Shower/Tub: Chiropodist: Standard Bathroom  Accessibility: Yes Home Equipment: None   Functional History: Prior Function Level of Independence: Independent Comments: Driving  Functional Status:  Mobility: Bed Mobility Overal bed mobility: Needs Assistance Bed Mobility: Supine to Sit, Sit to Supine Rolling: Min assist Sidelying to sit: Min assist, HOB elevated Supine to sit: Min assist Sit to supine: Min assist General bed mobility comments: OT arrived with pt seated at EOB with PT Transfers Overall transfer level: Needs assistance Equipment used: Pushed w/c Transfers: Sit to/from Stand Sit to Stand: Min guard, +2 safety/equipment Stand pivot transfers: Min assist, +2 physical assistance, +2 safety/equipment General transfer comment: Verbal cues for hand placement. Assist for safety. Ambulation/Gait Ambulation/Gait assistance: Min guard Ambulation Distance (Feet):  350 Feet Assistive device: Rolling walker (2 wheeled) Gait Pattern/deviations: Step-through pattern, Decreased stride length General Gait Details: Assist for safety. Took one sitting rest break after exiting the room and then able to complete rest of the amb without a break. Gait velocity interpretation: Below normal speed for age/gender    ADL: ADL Overall ADL's : Needs assistance/impaired Eating/Feeding: Set up, Sitting Grooming: Set up, Sitting Upper Body Bathing: Minimal assitance, Sitting Lower Body Bathing: Minimal assistance, Sit to/from stand Upper Body Dressing : Moderate assistance, Sitting Upper Body Dressing Details (indicate cue type and reason): vest and front opening gown Lower Body Dressing: Minimal assistance, Sit to/from stand Lower Body Dressing Details (indicate cue type and reason): continues to be able to don socks despite abdominal pain Toilet Transfer: Minimal assistance, +2 for physical assistance, +2 for safety/equipment, BSC, RW Toilet Transfer Details (indicate cue type and reason): Pt mildly unsteady.  Pt requires +2 for lines  and safety  Toileting- Clothing Manipulation and Hygiene: Moderate assistance, Sit to/from stand Functional mobility during ADLs: +2 for safety/equipment, Minimal assistance (pushing w/c, chair following) General ADL Comments: Pt immediately willing to get OOB with therapists, requesting to go outside. Much brighter affect today.  Cognition: Cognition Overall Cognitive Status: Within Functional Limits for tasks assessed Orientation Level: Oriented X4 Cognition Arousal/Alertness: Awake/alert Behavior During Therapy: WFL for tasks assessed/performed Overall Cognitive Status: Within Functional Limits for tasks assessed Difficult to assess due to: Level of arousal  Physical Exam: Blood pressure 107/81, pulse 96, temperature 98.2 F (36.8 C), temperature source Oral, resp. rate 18, height 5' 7" (1.702 m), weight 82.3 kg (181 lb 7 oz), SpO2 100 %. Physical Exam Constitutional: He is oriented to person, place, and time.  HENT:  Head: Normocephalic.  Eyes: EOM are normal.  Neck: Normal range of motion. Neck supple. No thyromegaly present.  Cardiovascular:  hum  Respiratory:  Decreased breath sounds at the bases  GI:  Mildly distended, nontender. Distant bowel sounds  Neurological: He is alert and oriented to person, place, and time. No cranial nerve deficit. Coordination normal.  UE grossly 4/5 prox to distal. LE: 3/5 HF 4- KE and 4/5 ADF/PF. No sensory findings.  Psychiatric: He has a normal mood and affect  Results for orders placed or performed during the hospital encounter of 08/26/15 (from the past 48 hour(s))  Glucose, capillary     Status: None   Collection Time: 09/21/15 11:39 AM  Result Value Ref Range   Glucose-Capillary 82 65 - 99 mg/dL   Comment 1 Capillary Specimen   Glucose, capillary     Status: None   Collection Time: 09/21/15  4:09 PM  Result Value Ref Range   Glucose-Capillary 92 65 - 99 mg/dL  Glucose, capillary     Status: None   Collection Time: 09/21/15   8:11 PM  Result Value Ref Range   Glucose-Capillary 86 65 - 99 mg/dL   Comment 1 Capillary Specimen   Lactate dehydrogenase     Status: Abnormal   Collection Time: 09/22/15  4:15 AM  Result Value Ref Range   LDH 286 (H) 98 - 192 U/L  Protime-INR     Status: Abnormal   Collection Time: 09/22/15  4:15 AM  Result Value Ref Range   Prothrombin Time 23.2 (H) 11.6 - 15.2 seconds   INR 2.08 (H) 0.00 - 1.49  CBC     Status: Abnormal   Collection Time: 09/22/15  4:15 AM  Result Value Ref Range   WBC 13.6 (H)   4.0 - 10.5 K/uL   RBC 3.18 (L) 4.22 - 5.81 MIL/uL   Hemoglobin 8.3 (L) 13.0 - 17.0 g/dL   HCT 26.0 (L) 39.0 - 52.0 %   MCV 81.8 78.0 - 100.0 fL   MCH 26.1 26.0 - 34.0 pg   MCHC 31.9 30.0 - 36.0 g/dL   RDW 19.1 (H) 11.5 - 15.5 %   Platelets 189 150 - 400 K/uL  Comprehensive metabolic panel     Status: Abnormal   Collection Time: 09/22/15  4:15 AM  Result Value Ref Range   Sodium 140 135 - 145 mmol/L   Potassium 4.1 3.5 - 5.1 mmol/L   Chloride 104 101 - 111 mmol/L   CO2 24 22 - 32 mmol/L   Glucose, Bld 78 65 - 99 mg/dL   BUN 51 (H) 6 - 20 mg/dL   Creatinine, Ser 5.80 (H) 0.61 - 1.24 mg/dL   Calcium 8.7 (L) 8.9 - 10.3 mg/dL   Total Protein 6.8 6.5 - 8.1 g/dL   Albumin 1.7 (L) 3.5 - 5.0 g/dL   AST 93 (H) 15 - 41 U/L   ALT 15 (L) 17 - 63 U/L   Alkaline Phosphatase 222 (H) 38 - 126 U/L   Total Bilirubin 5.5 (H) 0.3 - 1.2 mg/dL   GFR calc non Af Amer 10 (L) >60 mL/min   GFR calc Af Amer 12 (L) >60 mL/min    Comment: (NOTE) The eGFR has been calculated using the CKD EPI equation. This calculation has not been validated in all clinical situations. eGFR's persistently <60 mL/min signify possible Chronic Kidney Disease.    Anion gap 12 5 - 15  Phosphorus     Status: Abnormal   Collection Time: 09/22/15  4:15 AM  Result Value Ref Range   Phosphorus 5.4 (H) 2.5 - 4.6 mg/dL  Carboxyhemoglobin     Status: Abnormal   Collection Time: 09/22/15  4:34 AM  Result Value Ref Range    Total hemoglobin 9.1 (L) 13.5 - 18.0 g/dL   O2 Saturation 55.5 %   Carboxyhemoglobin 2.1 (H) 0.5 - 1.5 %   Methemoglobin 1.1 0.0 - 1.5 %  Glucose, capillary     Status: Abnormal   Collection Time: 09/22/15  9:19 AM  Result Value Ref Range   Glucose-Capillary 104 (H) 65 - 99 mg/dL   Comment 1 Capillary Specimen   Aerobic Culture (superficial specimen) (NOT AT [facility])     Status: None (Preliminary result)   Collection Time: 09/22/15 11:56 AM  Result Value Ref Range   Specimen Description ABDOMEN    Special Requests Normal    Gram Stain      FEW WBC PRESENT,BOTH PMN AND MONONUCLEAR NO ORGANISMS SEEN    Culture PENDING    Report Status PENDING   Glucose, capillary     Status: None   Collection Time: 09/22/15  9:37 PM  Result Value Ref Range   Glucose-Capillary 95 65 - 99 mg/dL   Comment 1 Capillary Specimen   Carboxyhemoglobin     Status: Abnormal   Collection Time: 09/23/15  4:23 AM  Result Value Ref Range   Total hemoglobin 8.7 (L) 13.5 - 18.0 g/dL   O2 Saturation 60.6 %   Carboxyhemoglobin 1.6 (H) 0.5 - 1.5 %   Methemoglobin 1.1 0.0 - 1.5 %  Lactate dehydrogenase     Status: Abnormal   Collection Time: 09/23/15  4:40 AM  Result Value Ref Range   LDH 280 (H) 98 - 192 U/L  Protime-INR     Status: Abnormal   Collection Time: 09/23/15  4:40 AM  Result Value Ref Range   Prothrombin Time 31.1 (H) 11.6 - 15.2 seconds   INR 3.06 (H) 0.00 - 1.49  Phosphorus     Status: Abnormal   Collection Time: 09/23/15  4:40 AM  Result Value Ref Range   Phosphorus 6.3 (H) 2.5 - 4.6 mg/dL  CBC     Status: Abnormal   Collection Time: 09/23/15  4:40 AM  Result Value Ref Range   WBC 12.3 (H) 4.0 - 10.5 K/uL   RBC 3.21 (L) 4.22 - 5.81 MIL/uL   Hemoglobin 8.6 (L) 13.0 - 17.0 g/dL   HCT 26.3 (L) 39.0 - 52.0 %   MCV 81.9 78.0 - 100.0 fL   MCH 26.8 26.0 - 34.0 pg   MCHC 32.7 30.0 - 36.0 g/dL   RDW 18.9 (H) 11.5 - 15.5 %   Platelets 230 150 - 400 K/uL    Comment: PLATELET COUNT CONFIRMED BY  SMEAR  Comprehensive metabolic panel     Status: Abnormal   Collection Time: 09/23/15  4:40 AM  Result Value Ref Range   Sodium 140 135 - 145 mmol/L   Potassium 4.5 3.5 - 5.1 mmol/L   Chloride 106 101 - 111 mmol/L   CO2 22 22 - 32 mmol/L   Glucose, Bld 80 65 - 99 mg/dL   BUN 55 (H) 6 - 20 mg/dL   Creatinine, Ser 6.29 (H) 0.61 - 1.24 mg/dL   Calcium 8.4 (L) 8.9 - 10.3 mg/dL   Total Protein 7.0 6.5 - 8.1 g/dL   Albumin 1.8 (L) 3.5 - 5.0 g/dL   AST 75 (H) 15 - 41 U/L   ALT 11 (L) 17 - 63 U/L   Alkaline Phosphatase 226 (H) 38 - 126 U/L   Total Bilirubin 5.0 (H) 0.3 - 1.2 mg/dL   GFR calc non Af Amer 10 (L) >60 mL/min   GFR calc Af Amer 11 (L) >60 mL/min    Comment: (NOTE) The eGFR has been calculated using the CKD EPI equation. This calculation has not been validated in all clinical situations. eGFR's persistently <60 mL/min signify possible Chronic Kidney Disease.    Anion gap 12 5 - 15  Glucose, capillary     Status: None   Collection Time: 09/23/15  8:29 AM  Result Value Ref Range   Glucose-Capillary 83 65 - 99 mg/dL   Comment 1 Capillary Specimen    No results found.     Medical Problem List and Plan: 1.  Debilitation secondary to C. Difficile/MSSA and abscess associated with LVAD. Status post irrigation debridement of chest sternal wound 09/09/2015 2.  DVT Prophylaxis/Anticoagulation: Coumadin therapy for history of atrial fibrillation. Monitor for any bleeding episodes 3. Pain Management: Lyrica 75 mg daily,Ultram 50mg every 6 hour, Oxycodone as needed 4. Mood: Celexa 20 mg daily 5. Neuropsych: This patient is capable of making decisions on his own behalf. 6. Skin/Wound Care: Routine skin checks 7. Fluids/Electrolytes/Nutrition: Routine I&O's with follow-up chemistries 8. ID. LVAD driveline infection/MSSA. Presently day 30/42 Cefazolin and rifampin.Follow up with infectious disease Dr. Comer 9. Clostridium difficile. Twice a day vancomycin through 10/08/2015. 10.  AKI. Follow-up renal services.No pending plan for dialysis at this time. Follow-up chemistries 11. Hypertension. Hydralazine 25 mg every 8 hours, Revatio 20 mg 3 times a day 12. Ileus. Resolved. Diet advance. Nausea felt to be related to antibiotics. Continue Reglan for now 13. Atrial fibrillation. Cardiac rate controlled.   Continue Coumadin 14. Acute on chronic anemia. Follow-up CBC hemoglobin 8.1-9.2 15.Gout.Monitor for flair up.Continue Zyloprim    Post Admission Physician Evaluation: 1. Functional deficits secondary  to Deconditioning  2. Patient is admitted to receive collaborative, interdisciplinary care between the physiatrist, rehab nursing staff, and therapy team. 3. Patient's level of medical complexity and substantial therapy needs in context of that medical necessity cannot be provided at a lesser intensity of care such as a SNF. 4. Patient has experienced substantial functional loss from his/her baseline which was documented above under the "Functional History" and "Functional Status" headings.  Judging by the patient's diagnosis, physical exam, and functional history, the patient has potential for functional progress which will result in measurable gains while on inpatient rehab.  These gains will be of substantial and practical use upon discharge  in facilitating mobility and self-care at the household level. 5. Physiatrist will provide 24 hour management of medical needs as well as oversight of the therapy plan/treatment and provide guidance as appropriate regarding the interaction of the two. 6. 24 hour rehab nursing will assist with bladder management, bowel management, safety, skin/wound care, disease management, medication administration, pain management and patient education  and help integrate therapy concepts, techniques,education, etc. 7. PT will assess and treat for/with: pre gait, gait training, endurance , safety, equipment, neuromuscular re education.   Goals are: Modified  independent. 8. OT will assess and treat for/with: ADLs, Cognitive perceptual skills, Neuromuscular re education, safety, endurance, equipment.   Goals are: Modified independent. Therapy May proceed with showering this patient. 9. SLP will assess and treat for/with: NA.  Goals are: NA. 10. Case Management and Social Worker will assess and treat for psychological issues and discharge planning. 11. Team conference will be held weekly to assess progress toward goals and to determine barriers to discharge. 12. Patient will receive at least 3 hours of therapy per day at least 5 days per week. 13. ELOS: 7d       14. Prognosis:  excellent     Charlett Blake M.D. [facility] FAAPM&R (Sports Med, Neuromuscular Med) Diplomate Am Board of Electrodiagnostic Med   09/23/2015

## 2015-09-26 NOTE — Progress Notes (Signed)
Physical Therapy Session Note  Patient Details  Name: [name] MRN: [id number] Date of Birth: 01/16/1971  Today's Date: 09/26/2015 PT Individual Time: 1300-1330 30 min group 1330-1400 30 min individual   Short Term Goals: Week 1:  PT Short Term Goal 1 (Week 1): STGs = LTGs  Skilled Therapeutic Interventions/Progress Updates:  Ambulation/gait training;Balance/vestibular training;Discharge planning;Neuromuscular re-education;UE/LE Coordination activities;UE/LE Strength taining/ROM;Therapeutic Exercise;Therapeutic Activities;Patient/family education;Stair training;DME/adaptive equipment instruction;Functional mobility training;Community reintegration   Therapy Documentation Precautions:  Precautions Precautions: Fall Precaution Comments: LVAD Restrictions Weight Bearing Restrictions: No General: Chart Reviewed: Yes Family/Caregiver Present: No Pain: Pain Assessment Pain Assessment: No/denies pain Mobility: Bed Mobility Bed Mobility: Rolling Right;Rolling Left Rolling Right: 5: Supervision Rolling Right Details: Verbal cues for sequencing;Verbal cues for technique Rolling Left: 5: Supervision Rolling Left Details: Verbal cues for technique;Verbal cues for sequencing Transfers Transfers: Yes Sit to Stand: 5: Supervision Sit to Stand Details: Verbal cues for technique Sit to Stand Details (indicate cue type and reason): verbal cues for controlled descent and positoning  Stand to Sit: 5: Supervision Stand to Sit Details (indicate cue type and reason): Verbal cues for technique Stand to Sit Details: verbal cues for controlled descent and positoning  Stand Pivot Transfers: 5: Supervision Stand Pivot Transfer Details: Verbal cues for technique Stand Pivot Transfer Details (indicate cue type and reason): verbal cues for controlled descent and positoning  Locomotion : Ambulation Ambulation: Yes Ambulation/Gait Assistance: 5: Supervision;4: Min guard Ambulation Distance  (Feet): 150 Feet Assistive device: None Ambulation/Gait Assistance Details: Verbal cues for gait pattern;Manual facilitation for weight shifting Ambulation/Gait Assistance Details: Verbal cues required for step and stride length; min assist required for croweded environments obstacle negotitaion and cues for environmental awareness  Gait Gait: Yes Gait Pattern: Impaired Gait Pattern: Decreased stride length;Decreased trunk rotation;Step-through pattern Gait velocity: decreased cadence with increased postural sway Stairs / Additional Locomotion Stairs: Yes Stairs Assistance: 4: Min guard Stair Management Technique: One rail Left;Step to pattern Number of Stairs: 12 Ramp: 5: Supervision Curb: 4: Min Chemical engineer: No     Balance: Balance Balance Assessed: Yes Standardized Balance Assessment Standardized Balance Assessment: Dynamic Gait Index Berg Balance Test Sit to Stand: Able to stand  independently using hands Standing Unsupported: Able to stand safely 2 minutes Sitting with Back Unsupported but Feet Supported on Floor or Stool: Able to sit safely and securely 2 minutes Stand to Sit: Controls descent by using hands Transfers: Able to transfer safely, definite need of hands Standing Unsupported with Eyes Closed: Able to stand 10 seconds safely Standing Ubsupported with Feet Together: Able to place feet together independently and stand 1 minute safely From Standing, Reach Forward with Outstretched Arm: Can reach confidently >25 cm (10") From Standing Position, Pick up Object from Floor: Able to pick up shoe, needs supervision From Standing Position, Turn to Look Behind Over each Shoulder: Turn sideways only but maintains balance Turn 360 Degrees: Able to turn 360 degrees safely but slowly Standing Unsupported, Alternately Place Feet on Step/Stool: Able to complete >2 steps/needs minimal assist Standing Unsupported, One Foot in Front: Able to take  small step independently and hold 30 seconds Standing on One Leg: Tries to lift leg/unable to hold 3 seconds but remains standing independently Total Score: 40 Dynamic Gait Index Level Surface: Normal Change in Gait Speed: Mild Impairment Gait with Horizontal Head Turns: Mild Impairment Gait with Vertical Head Turns: Mild Impairment Gait and Pivot Turn: Mild Impairment Step Over Obstacle: Mild Impairment Step Around Obstacles: Mild Impairment Steps: Mild  Impairment Total Score: 17 Static Sitting Balance Static Sitting - Level of Assistance: 7: Independent Dynamic Sitting Balance Dynamic Sitting - Level of Assistance: 7: Independent Static Standing Balance Static Standing - Balance Support: No upper extremity supported Static Standing - Level of Assistance: 7: Independent Dynamic Standing Balance Dynamic Standing - Balance Support: No upper extremity supported;During functional activity Dynamic Standing - Level of Assistance: 4: Min assist;5: Stand by assistance Dynamic Standing - Balance Activities: Lateral lean/weight shifting;Forward lean/weight shifting Dynamic Standing - Comments: supervision for minimum excursion mod excursion min assist.  Sit to and from stand transfer close supervision  Patient ambulated 150 feetx3close supervision and incidental min assist required secondary to external distractions. Verbal cues for pacing environmental awareness, and protective stepping strategies.   Patient performed lateral stepping min assist to the right and left 35 feetx2  Patient performed retrograde ambulation 50 feetx4 close supervision incidental min assist in order to increase hip extension.  Patient performed cone taps two trials for five minutes. Min to mod assist required from min to mod excursion.  Patient tolerated tx well. Patient required rest breaks throughout session. Patient required to room at end of session with all needs met. Patient educated not to be up without  assistance. Patient in agreement with recommendation and verbalized understanding.    See Function Navigator for Current Functional Status.   Therapy/Group: Individual Therapy  Retta Diones 09/26/2015, 3:26 PM

## 2015-09-26 NOTE — Interval H&P Note (Signed)
[name] was admitted today to Inpatient Rehabilitation with the diagnosis of deconditioning.  The patient's history has been reviewed, patient examined, and there is no change in status.  Patient continues to be appropriate for intensive inpatient rehabilitation.  I have reviewed the patient's chart and labs.  Questions were answered to the patient's satisfaction. The PAPE has been reviewed and assessment remains appropriate.  [name] E 09/26/2015, 7:03 AM

## 2015-09-26 NOTE — Care Management Note (Signed)
[facility] Individual Statement of Services  Patient Name:  [name]  Date:  09/26/2015  Welcome to the [facility].  Our goal is to provide you with an individualized program based on your diagnosis and situation, designed to meet your specific needs.  With this comprehensive rehabilitation program, you will be expected to participate in at least 3 hours of rehabilitation therapies Monday-Friday, with modified therapy programming on the weekends.  Your rehabilitation program will include the following services:  Physical Therapy (PT), Occupational Therapy (OT), 24 hour per day rehabilitation nursing, Neuropsychology, Case Management (Social Worker), Rehabilitation Medicine, Nutrition Services and Pharmacy Services  Weekly team conferences will be held on Wednesday to discuss your progress.  Your Social Worker will talk with you frequently to get your input and to update you on team discussions.  Team conferences with you and your family in attendance may also be held.  Expected length of stay: 7-9 days  Overall anticipated outcome: mod/i-independent level  Depending on your progress and recovery, your program may change. Your Social Worker will coordinate services and will keep you informed of any changes. Your Social Worker's name and contact numbers are listed  below.  The following services may also be recommended but are not provided by the [facility] will be made to provide these services after discharge if needed.  Arrangements include referral to agencies that provide these services.  Your insurance has been verified to be:  [facility] Your primary doctor is:  Sandi Mariscal  Pertinent information will be shared with your doctor and your insurance company.  Social Worker:  Ovidio Kin,  [place] or ([phone]  Information discussed with and copy given to patient by: Elease Hashimoto, 09/26/2015, 2:30 PM

## 2015-09-26 NOTE — Evaluation (Signed)
Occupational Therapy Assessment and Plan  Patient Details  Name: [name] MRN: [id number] Date of Birth: October 25, 1970  OT Diagnosis: muscle weakness (generalized) and decreased balance Rehab Potential: Rehab Potential (ACUTE ONLY): Good ELOS: 7-9 days   Today's Date: 09/26/2015 OT Individual Time: 2671-2458 OT Individual Time Calculation (min): 87 min     Problem List:  Patient Active Problem List   Diagnosis Date Noted  . Wound infection ([place])   . Debility 09/25/2015  . Debilitated 09/25/2015  . Chronic systolic congestive heart failure ([place])   . Colitis   . Septic shock ([place])   . Enteritis due to Clostridium difficile   . Staphylococcus aureus bacteremia   . 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[place])   . Abscess of abdominal wall   . Acute on chronic systolic (congestive) heart failure ([place])   . Left ventricular assist device (LVAD) complication 09/98/3382  . Epistaxis 05/21/2015  . Situational depression 04/13/2015  . Acute gout 01/15/2015  . Palliative care encounter 12/17/2014  . Cardiogenic shock ([place])   . Acute on chronic systolic CHF (congestive heart failure) ([place])   . Acute on chronic renal failure ([place])   . Chronic atrial fibrillation ([place])   . Low output heart failure ([place]) 12/14/2014  . Acute abdominal pain   . Near syncope   . Hypotension 11/30/2014  . AKI (acute kidney injury) ([place])   . Abnormal LFTs 10/26/2012  . Hyperthyroidism 05/26/2011  . 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 ICD-[employer] 07/22/2010  . Nonischemic cardiomyopathy ([place]) 07/22/2010  . Essential hypertension, benign 11/21/2008  . SYSTOLIC HEART FAILURE, CHRONIC 10/23/2008    Past Medical History:  Past Medical History  Diagnosis Date  . Chronic systolic heart failure (HCC)     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%)  . Atrial fibrillation -parosysmal      Rx w amiodarone  . Noncompliance     H/O  MEDICAL NONCOMPLIANCE  . 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 Tricuspid valve regurgitation     SEVERE  . Severe mitral regurgitation   . Polymorphic ventricular tachycardia (HCC)     RECURRENT WITH APPROPRIATE SHOCK THERAPY IN THE PAST  . Ventricular fibrillation (HCC)     WITH APPROPRIATE SHOCK THERAPY IN THE PAST  . Hypertension   . Gout   . RA (rheumatoid arthritis) ([place])   . Automatic implantable cardiac defibrillator -BSX     single chamber  . GI bleed -massive     11 Units 2012  . Elevated LFTs   . H/O hyperthyroidism   . CHF (congestive heart failure) ([place])   . AKI (acute kidney injury) ([place])   . AICD (automatic cardioverter/defibrillator) present    Past Surgical History:  Past Surgical History  Procedure Laterality Date  . Cardiac catheterization  06/2006    RIGHT HEART CATH SHOWING SEVERE BIVENTRICUALR CHF WITH MARKED FILLING AND PRESSURES  . Insert / replace / remove pacemaker      GUIDANT HE ICD MODEL 2180, SERIAL # [id number]  . Cholecystectomy    . Implantable cardioverter defibrillator generator change N/A 07/01/2011    Procedure: IMPLANTABLE CARDIOVERTER DEFIBRILLATOR GENERATOR CHANGE;  Surgeon: Deboraha Sprang, MD;  Location[facility] CATH LAB;  Service: Cardiovascular;  Laterality: N/A;  . Tee without cardioversion N/A 12/12/2014    Procedure: TRANSESOPHAGEAL ECHOCARDIOGRAM (TEE);  Surgeon: Jerline Pain, MD;  Location: [facility] ENDOSCOPY;  Service: Cardiovascular;  Laterality: N/A;  . Cardiac catheterization  N/A 12/14/2014    Procedure: Right Heart Cath;  Surgeon: Jolaine Artist, MD;  Location: [facility] CV LAB;  Service: Cardiovascular;  Laterality: N/A;  . Cardiac catheterization N/A 12/14/2014    Procedure: IABP Insertion;  Surgeon: Jolaine Artist, MD;  Location: [facility] CV LAB;  Service: Cardiovascular;  Laterality: N/A;  . Insertion of implantable left ventricular assist device N/A 12/20/2014    Procedure: INSERTION OF IMPLANTABLE LEFT VENTRICULAR ASSIST  DEVICE;  Surgeon: Ivin Poot, MD;  Location: [facility];  Service: Open Heart Surgery;  Laterality: N/A;  CIRC ARREST  NITRIC OXIDE  . Tee without cardioversion N/A 12/20/2014    Procedure: TRANSESOPHAGEAL ECHOCARDIOGRAM (TEE);  Surgeon: Ivin Poot, MD;  Location: [facility];  Service: Open Heart Surgery;  Laterality: N/A;  . Sternal wound debridement N/A 08/27/2015    Procedure: WOUND IRRIGATION AND DEBRIDEMENT;  Surgeon: Ivin Poot, MD;  Location: [facility];  Service: Thoracic;  Laterality: N/A;  . Application of wound vac N/A 08/27/2015    Procedure: APPLICATION OF WOUND VAC;  Surgeon: Ivin Poot, MD;  Location: [facility];  Service: Thoracic;  Laterality: N/A;  . Sternal wound debridement N/A 08/29/2015    Procedure: DEBRIDEMENT OF chest wound;  Surgeon: Ivin Poot, MD;  Location: [facility];  Service: Thoracic;  Laterality: N/A;  . Application of wound vac N/A 08/29/2015    Procedure: WOUND VAC CHANGE;  Surgeon: Ivin Poot, MD;  Location: [facility];  Service: Thoracic;  Laterality: N/A;  . Incision and drainage of wound N/A 09/05/2015    Procedure: IRRIGATION AND DEBRIDEMENT sternal WOUND;  Surgeon: Loel Lofty Dillingham, DO;  Location: [facility];  Service: Plastics;  Laterality: N/A;  . Application of a-cell of extremity N/A 09/05/2015    Procedure: APPLICATION OF A-CELL OF sternum;  Surgeon: Loel Lofty Dillingham, DO;  Location: [facility];  Service: Plastics;  Laterality: N/A;  . Application of wound vac N/A 09/05/2015    Procedure: APPLICATION OF WOUND VAC to sternum;  Surgeon: Loel Lofty Dillingham, DO;  Location: [facility];  Service: Plastics;  Laterality: N/A;  . Insertion of dialysis catheter Right 09/05/2015    Procedure: INSERTION OF DIALYSIS CATHETER;RIGHT SUBCLAVIAN;  Surgeon: Loel Lofty Dillingham, DO;  Location: [facility];  Service: Plastics;  Laterality: Right;  . Sternal closure N/A 09/17/2015    Procedure: ABDOMINAL WOUND CLOSURE WITH INCISIONAL VAC APPLICATION;  Surgeon: Ivin Poot, MD;  Location: [facility];  Service: Thoracic;  Laterality: N/A;  . Esophagogastroduodenoscopy N/A 09/17/2015    Procedure: ESOPHAGOGASTRODUODENOSCOPY (EGD);  Surgeon: Carol Ada, MD;  Location: [facility];  Service: Gastroenterology;  Laterality: N/A;  . Pectoralis flap N/A 09/09/2015    Procedure: DEBRIDEMENT AND CLOSURE WOUND WITH PLACEMENT OF ABRA CLOSURE DEVICE;  Surgeon: Loel Lofty Dillingham, DO;  Location: [facility];  Service: Plastics;  Laterality: N/A;    Assessment & Plan Clinical Impression: Patient is a 45 y.o. right handed male with history of systolic congestive heart failure, LVAD 12/20/2014, atrial fibrillation maintained on Coumadin, medical noncompliance. Patient lives alone in [place]. Independent prior to admission. He has a brother in [place]. Recently traveled to [place] to see his mother and developed fever chills and abdominal pain. Went to [facility] in [place]. Initially felt to have pyelonephritis but CT scan negative. Found to have C. difficile colitis placed on oral vancomycin. Developed subxiphoid swelling and subsequent had a CT which showed large abscess surrounding driveline at LVAD site. Blood cultures  showed MSSA. Seen by infectious disease started on nafcillin and transferred to [facility] for further management. Since initial infection was superficial to pump and walled off so pump exchange was not indicated. Patient underwent irrigation and debridement of chest sternal wound 09/09/2015 with closure of abdominal wound 09/18/2015 per Dr. Nils Pyle. TEE showed no vegetation on the valves were ICD wire. Presently remains on cefazolin and rifampin day 30/42 completed. Hospital course ileus and diet slowly advanced with nasogastric tube remaining in place for a short time. Bouts of nausea vomiting felt to be related to antibiotic and rifampin or change to intravenous due to nausea. He remains on twice a day vancomycin for C. difficile through 10/08/2015. Nephrology  consulted 09/03/2015 for AKI elevated 1.40-3.89-7.13. Renal ultrasound negative. Hemodialysis initiated. Physical therapy evaluation completed with recommendations to physical medicine rehabilitation consult.    Patient transferred to CIR on 09/25/2015 .    Patient currently requires min with basic self-care skills secondary to muscle weakness, decreased cardiorespiratoy endurance and decreased standing balance, decreased postural control and decreased balance strategies.  Prior to hospitalization, patient could complete ADLs and IADLs with modified independent .  Patient will benefit from skilled intervention to increase independence with basic self-care skills and increase level of independence with iADL prior to discharge home independently.  Anticipate patient will require intermittent supervision and no further OT follow recommended.  OT - End of Session Activity Tolerance: Tolerates 30+ min activity without fatigue Endurance Deficit: No OT Assessment Rehab Potential (ACUTE ONLY): Good Barriers to Discharge Comments: Plan to set Mod I goals, and pt reports siblings and church members can assist as needed OT Patient demonstrates impairments in the following area(s): Balance;Endurance;Motor;Safety OT Basic ADL's Functional Problem(s): Grooming;Bathing;Dressing;Toileting OT Advanced ADL's Functional Problem(s): Full Meal Preparation;Laundry;Light Housekeeping OT Transfers Functional Problem(s): Toilet OT Additional Impairment(s): None OT Plan OT Intensity: Minimum of 1-2 x/day, 45 to 90 minutes OT Frequency: 5 out of 7 days OT Duration/Estimated Length of Stay: 7-9 days OT Treatment/Interventions: Medical illustrator training;Community reintegration;Discharge planning;Disease mangement/prevention;DME/adaptive equipment instruction;Functional mobility training;Patient/family education;Psychosocial support;Self Care/advanced ADL retraining;Therapeutic Activities;Therapeutic Exercise;UE/LE Strength  taining/ROM OT Self Feeding Anticipated Outcome(s): No goal OT Basic Self-Care Anticipated Outcome(s): Mod I OT Toileting Anticipated Outcome(s): Mod i OT Bathroom Transfers Anticipated Outcome(s): Mod I OT Recommendation Patient destination: Home Follow Up Recommendations: None Equipment Recommended: To be determined   Skilled Therapeutic Intervention OT eval completed with discussion of rehab process, OT purpose, POC, ELOS, and goals.  Pt declined bathing and dressing as he has no clothes, sister to bring this weekend, and reporting that he washed up a little bit this morning.  Ambulated to ADL apartment with contact guard - supervision as pt would veer Rt and Lt as he fatigued but not requiring any physical assist or AD with mobility.  Engaged in Justice transfer with stepping over tub ledge with contact guard assist.  Pt reports he can shower with LVAD but chooses to sponge bathe as it requires a lot of time and effort to ensure water proofing of drive line.  Began discussion of energy conservation strategies and increased endurance during ADLs and IADLs  Pt ambulated outside hospital on uneven surfaces and completed 4 steps up/down with contact guard due to instability and weakness in single leg stance.  OT Evaluation Precautions/Restrictions  Precautions Precautions: Fall Precaution Comments: LVAD Pain Pain Assessment Pain Assessment: No/denies pain Home Living/Prior Functioning Home Living Available Help at Discharge: Family, Available 24 hours/day (siblings and church members can provide assist) Type  of Home: House Home Access: Stairs to enter [employer] of Steps: 1 step  Home Layout: One level Bathroom Shower/Tub: Chiropodist: Standard  Lives With: Alone IADL History Homemaking Responsibilities: Yes Meal Prep Responsibility: Primary Laundry Responsibility: Primary Cleaning Responsibility: Primary Bill Paying/Finance Responsibility:  Primary Shopping Responsibility: Primary Current License: Yes Mode of Transportation: Car Occupation: Full time employment Leisure and Hobbies: enjoys travel and going to sporting events, cooking Prior Function Level of Independence: Independent with basic ADLs, Independent with homemaking with ambulation, Independent with gait, Independent with transfers  Able to Take Stairs?: Yes Driving: Yes Vocation: Full time employment ADL  See Function Navigator Vision/Perception  Vision- History Baseline Vision/History: Wears glasses Wears Glasses: Reading only Patient Visual Report: No change from baseline Vision- Assessment Vision Assessment?: No apparent visual deficits  Cognition Arousal/Alertness: Awake/alert Orientation Level: Person;Place;Situation Person: Oriented Place: Oriented Situation: Oriented Year: 2017 Month: June Day of Week: Correct Memory: Appears intact Immediate Memory Recall: Sock;Blue;Bed Memory Recall: Sock;Blue;Bed Memory Recall Sock: Without Cue Memory Recall Blue: Without Cue Memory Recall Bed: Without Cue Attention: Alternating Awareness: Appears intact Problem Solving: Appears intact Safety/Judgment: Appears intact Sensation Sensation Light Touch: Appears Intact Stereognosis: Not tested Hot/Cold: Not tested Proprioception: Appears Intact Coordination Fine Motor Movements are Fluid and Coordinated: Yes Finger Nose Finger Test: [facility] Extremity/Trunk Assessment RUE Assessment RUE Assessment: Within Functional Limits (ROM WFL, strength grossly 4/5) LUE Assessment LUE Assessment: Within Functional Limits (ROM WFL, strength grossly 4/5)   See Function Navigator for Current Functional Status.   Refer to Care Plan for Long Term Goals  Recommendations for other services: None  Discharge Criteria: Patient will be discharged from OT if patient refuses treatment 3 consecutive times without medical reason, if treatment goals not met, if there is a  change in medical status, if patient makes no progress towards goals or if patient is discharged from hospital.  The above assessment, treatment plan, treatment alternatives and goals were discussed and mutually agreed upon: by patient  [name] 09/26/2015, 12:19 PM

## 2015-09-26 NOTE — Progress Notes (Signed)
ANTICOAGULATION CONSULT NOTE   Pharmacy Consult for Coumadin Indication: LVAD / yeast in urine  Allergies  Allergen Reactions  . Phytonadione Other (See Comments)    Patient has LVAD: please check with LVAD coordinator on call or LVAD MD on call before reversal of anticoagulation with vit k    Patient Measurements: Weight: 172 lb 2.9 oz (78.1 kg) Heparin Dosing Weight: n/a  Vital Signs: Temp: 97.5 F (36.4 C) (06/08 0609) Temp Source: Oral (06/08 0609) Pulse Rate: 77 (06/08 0609)  Labs:  Recent Labs  09/24/15 0455  09/25/15 0515 09/25/15 0900 09/25/15 1529 09/26/15 0520  HGB  --   < >  --  7.9* 8.8* 8.0*  HCT  --   < >  --  24.9* 27.6* 25.4*  PLT  --   < >  --  187 225 208  LABPROT 32.0*  --  25.3*  --   --  25.2*  INR 3.18*  --  2.33*  --   --  2.31*  CREATININE 6.50*  --  6.63*  --   --  6.78*  < > = values in this interval not displayed.  Estimated Creatinine Clearance: 12.6 mL/min (by C-G formula based on Cr of 6.78).   Medications:  Scheduled:  . allopurinol  100 mg Oral Daily  .  ceFAZolin (ANCEF) IV  2 g Intravenous Q24H  . citalopram  20 mg Oral Daily  . docusate sodium  200 mg Oral Daily  . feeding supplement (NEPRO CARB STEADY)  237 mL Oral BID BM  . fluconazole (DIFLUCAN) IV  100 mg Intravenous Q24H  . furosemide  80 mg Oral BID  . hydrALAZINE  25 mg Oral Q8H  . metoCLOPramide  5 mg Oral Q6H  . pregabalin  75 mg Oral Daily  . rifampin  300 mg Oral Q12H  . sildenafil  20 mg Oral Q8H  . simethicone  80 mg Oral QID  . tamsulosin  0.4 mg Oral Daily  . vancomycin  125 mg Oral Q12H  . warfarin  4 mg Oral ONCE-1800  . Warfarin - Pharmacist Dosing Inpatient   Does not apply q1800    Assessment: 45 yo male with LVAD on chronic Coumadin.  Pharmacy now dosing at [place] request.  Patient receiving ancef and rifampin for driveline infection and MSSA bacteremia.  Also on fluconazole for yeast in urine.  INR now stablizing, anticipate difficulty keeping in  range given drug interactions with both rifampin and fluconazole.  No bleeding or complications noted.  5/2 Nafcillin>5/15 5/15 imip>5/18 5/18 CTX > 5/23 5/23 Ancef> (6/20) 5/2 po vanc> (6/20) 5/15 vanc IV> 5/17 5/8 rifampin> (6/20) 6/4 fluconazole> per ID limit to 7 days- d/c 6/10  5/9 Abd abscess cx > MSSA 5/9 BCx x 2 > ngtd 5/15 sputum > Klebsiella (pan S except to ampicillin) 5/15 urine > negative 5/15 blood x 1 > ngtd 5/28 cdiff - neg 5/30 bld x2 -ngtd 5/30 urine - >100k yeast 6/3 urine - >100K yeast  Goal of Therapy:  INR 2-2.5 Monitor platelets by anticoagulation protocol: Yes   Plan:  1. Coumadin 4 mg x 1 tonight. 2. F/u daily INR   Uvaldo Rising, BCPS  Clinical Pharmacist Pager [phone]  09/26/2015 1:50 PM

## 2015-09-26 NOTE — Progress Notes (Signed)
HeartMate 2 Rounding Note  Subjective:     Transferred to CIR 6/7.   Creatinine still climbing slowly 6.6 -> 6.7. Volume status ok. Not uremic. Urine output not recorded.   Feels ok. Hgb relatively stable. Appetite picking up  INR 2.3   VAD INTERROGATION:  HeartMate II LVAD: Flow 4.8 liters/min, speed 9200, power 5 PI 7.8. No PI events. .   Objective:    Vital Signs:   Temp:  [97.5 F (36.4 C)-97.9 F (36.6 C)] 97.5 F (36.4 C) (06/08 0609) Pulse Rate:  [77-81] 77 (06/08 0609) Resp:  [16-18] 16 (06/08 0609) BP: (100)/(87) 100/87 mmHg (06/07 1500) SpO2:  [100 %] 100 % (06/08 0609) Weight:  [78.1 kg (172 lb 2.9 oz)] 78.1 kg (172 lb 2.9 oz) (06/08 0500) Last BM Date: 09/26/15 Mean arterial Pressure 80s  Intake/Output:   Intake/Output Summary (Last 24 hours) at 09/26/15 0905 Last data filed at 09/26/15 0524  Gross per 24 hour  Intake    180 ml  Output      0 ml  Net    180 ml    Physical Exam: General: In the bed. NAD HEENT: Scleral icterus improving Neck: supple. JVP 6 cm. Carotids 2+ bilat; no bruits. No lymphadenopathy or thryomegaly appreciated. R subclavian trialysis cath dressing dry Cor: Mechanical heart sounds with LVAD hum present. Lungs: clear on  Abdomen: soft, tender, . + bowel sounds. Dressing ok.  Driveline: C/D/I; securement device intact and driveline incorporated Extremities: no cyanosis, clubbing, rash, trace edema  Neuro: A & O x3  Telemetry: NSR  Labs: Basic Metabolic Panel:  Recent Labs Lab 09/21/15 0437 09/22/15 0415 09/23/15 0440 09/24/15 0455 09/25/15 0515 09/26/15 0520  NA 139 140 140 141 139 140  K 3.6 4.1 4.5 5.1 5.1 4.5  CL 102 104 106 106 106 109  CO2 27 24 22  21* 20* 20*  GLUCOSE 96 78 80 76 74 84  BUN 42* 51* 55* 59* 58* 59*  CREATININE 4.79* 5.80* 6.29* 6.50* 6.63* 6.78*  CALCIUM 8.8* 8.7* 8.4* 8.8* 8.7* 8.6*  PHOS 4.1 5.4* 6.3* 6.5* 6.7*  --     Liver Function Tests:  Recent Labs Lab  09/20/15 0351 09/21/15 0437 09/22/15 0415 09/23/15 0440 09/24/15 0455 09/25/15 0515 09/26/15 0520  AST 66* 94* 93* 75*  --   --  74*  ALT 12* 18 15* 11*  --   --  9*  ALKPHOS 203* 227* 222* 226*  --   --  265*  BILITOT 5.0* 5.1* 5.5* 5.0*  --   --  4.3*  PROT 6.4* 6.6 6.8 7.0  --   --  6.7  ALBUMIN 1.8* 1.7* 1.7* 1.8* 1.7* 1.7* 1.8*   No results for input(s): LIPASE, AMYLASE in the last 168 hours. No results for input(s): AMMONIA in the last 168 hours.  CBC:  Recent Labs Lab 09/23/15 0440 09/24/15 1400 09/25/15 0900 09/25/15 1529 09/26/15 0520  WBC 12.3* 13.8* 10.7* 11.2* 9.6  NEUTROABS  --   --   --   --  7.4  HGB 8.6* 9.2* 7.9* 8.8* 8.0*  HCT 26.3* 28.6* 24.9* 27.6* 25.4*  MCV 81.9 83.6 82.5 83.1 80.9  PLT 230 220 187 225 208    INR:  Recent Labs Lab 09/22/15 0415 09/23/15 0440 09/24/15 0455 09/25/15 0515 09/26/15 0520  INR 2.08* 3.06* 3.18* 2.33* 2.31*    Other results:    Imaging:  No results found.   Medications:     Scheduled  Medications: . allopurinol  100 mg Oral Daily  .  ceFAZolin (ANCEF) IV  2 g Intravenous Q24H  . citalopram  20 mg Oral Daily  . docusate sodium  200 mg Oral Daily  . feeding supplement (NEPRO CARB STEADY)  237 mL Oral BID BM  . fluconazole (DIFLUCAN) IV  100 mg Intravenous Q24H  . hydrALAZINE  25 mg Oral Q8H  . metoCLOPramide  5 mg Oral Q6H  . pregabalin  75 mg Oral Daily  . rifampin  300 mg Oral Q12H  . sildenafil  20 mg Oral Q8H  . simethicone  80 mg Oral QID  . tamsulosin  0.4 mg Oral Daily  . vancomycin  125 mg Oral Q12H  . Warfarin - Pharmacist Dosing Inpatient   Does not apply q1800     Infusions:     PRN Medications:  acetaminophen, Gerhardt's butt cream, ondansetron **OR** ondansetron (ZOFRAN) IV, oxyCODONE, sodium chloride flush, sorbitol, traMADol   Assessment:    1. LVAD Complication- Driveline abscess 2. MSSA bacteremia -> septic shock 3. Chronic systolic HF s/p VAD placement 9/16 4.  C. Difficile colitis 5. 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 Acute blood loss anemia 8. Severe Malnutrition.  9. AKI - currently requiring HD.  10. Klebsiella (ESBL) pneumonia 11. Funguria   Plan/Discussion:    Making progress. Urine output ok but creatinine tending up slowly. No indications for HD today. Continue to follow.   Remains on cefazolin + rifampin for MSSA bacteremia and oral vanc for C difficile. Klebseilla PNA has been treated. PO intake improved. Ancef, oral Vanc, and Rifampin through June 20th. Urine with >100K yeast. Day 5/7 fluconazole per ID. Complete fluconazole through June 10th.   Maps better. Continue hydralazine 25 mg every 8 hrs. Continue for now. Weight and volume status stable.   Dr. Prescott Gum following wound.   VAD parameters ok.  Appreciate CIR.   NSR today. Off amio with elevated LFTs.  I reviewed the LVAD parameters from today, and compared the results to the patient's prior recorded data.  No programming changes were made.  The LVAD is functioning within specified parameters.  The patient performs LVAD self-test daily.  LVAD interrogation was negative for any significant power changes, alarms or PI events/speed drops.  LVAD equipment check completed and is in good working order.  Back-up equipment present.   LVAD education done on emergency procedures and precautions and reviewed exit site care.  Length of Stay: 1  [name], Daniel MD 09/26/2015, 9:05 AM  VAD Team --- VAD ISSUES ONLY--- Pager [phone] (7am - 7am)  Advanced Heart Failure Team  Pager [phone] (M-F; 7a - 4p)  Please contact [facility] for night-coverage after hours (4p -7a ) and weekends on [email or web address]

## 2015-09-26 NOTE — Progress Notes (Signed)
45 y.o. right handed male with history of systolic congestive heart failure, LVAD 12/20/2014, atrial fibrillation maintained on Coumadin, medical noncompliance. Patient lives alone in [place]. Independent prior to admission. He has a brother in [place]. Recently traveled to [place] to see his mother and developed fever chills and abdominal pain. Went to [facility] in [place]. Initially felt to have pyelonephritis but CT scan negative. Found to have C. difficile colitis placed on oral vancomycin. Developed subxiphoid swelling and subsequent had a CT which showed large abscess surrounding driveline at LVAD site. Blood cultures showed MSSA. Seen by infectious disease started on nafcillin and transferred to [facility] for further management. Since initial infection was superficial to pump and walled off so pump exchange was not indicated. Patient underwent irrigation and debridement of chest sternal wound 09/09/2015 with closure of abdominal wound 09/18/2015 per Dr. Nils Pyle. TEE showed no vegetation on the valves were ICD wire. Presently remains on cefazolin and rifampin day 30/42 completed. Hospital course ileus and diet slowly advanced with nasogastric tube remaining in place for a short time. Bouts of nausea vomiting felt to be related to antibiotic and rifampin or change to intravenous due to nausea. He remains on twice a day vancomycin for C. difficile through 10/08/2015  Subjective/Complaints: No issues overnite Denies pain  ROS- negative for chest pain, shortness of breath, nausea, vomiting, diarrhea,, constipation  Objective: Vital Signs: Pulse 77, temperature 97.5 F (36.4 C), temperature source Oral, resp. rate 16, weight 78.1 kg (172 lb 2.9 oz), SpO2 100 %. No results found. Results for orders placed or performed during the hospital encounter of 09/25/15 (from the past 72 hour(s))  CBC WITH DIFFERENTIAL     Status: Abnormal   Collection Time:  09/26/15  5:20 AM  Result Value Ref Range   WBC 9.6 4.0 - 10.5 K/uL   RBC 3.14 (L) 4.22 - 5.81 MIL/uL   Hemoglobin 8.0 (L) 13.0 - 17.0 g/dL   HCT 25.4 (L) 39.0 - 52.0 %   MCV 80.9 78.0 - 100.0 fL   MCH 25.5 (L) 26.0 - 34.0 pg   MCHC 31.5 30.0 - 36.0 g/dL   RDW 19.6 (H) 11.5 - 15.5 %   Platelets 208 150 - 400 K/uL   Neutrophils Relative % 77 %   Neutro Abs 7.4 1.7 - 7.7 K/uL   Lymphocytes Relative 12 %   Lymphs Abs 1.1 0.7 - 4.0 K/uL   Monocytes Relative 9 %   Monocytes Absolute 0.9 0.1 - 1.0 K/uL   Eosinophils Relative 2 %   Eosinophils Absolute 0.2 0.0 - 0.7 K/uL   Basophils Relative 0 %   Basophils Absolute 0.0 0.0 - 0.1 K/uL  Comprehensive metabolic panel     Status: Abnormal   Collection Time: 09/26/15  5:20 AM  Result Value Ref Range   Sodium 140 135 - 145 mmol/L   Potassium 4.5 3.5 - 5.1 mmol/L   Chloride 109 101 - 111 mmol/L   CO2 20 (L) 22 - 32 mmol/L   Glucose, Bld 84 65 - 99 mg/dL   BUN 59 (H) 6 - 20 mg/dL   Creatinine, Ser 6.78 (H) 0.61 - 1.24 mg/dL   Calcium 8.6 (L) 8.9 - 10.3 mg/dL   Total Protein 6.7 6.5 - 8.1 g/dL   Albumin 1.8 (L) 3.5 - 5.0 g/dL   AST 74 (H) 15 - 41 U/L   ALT 9 (L) 17 - 63 U/L   Alkaline Phosphatase 265 (H) 38 - 126  U/L   Total Bilirubin 4.3 (H) 0.3 - 1.2 mg/dL   GFR calc non Af Amer 9 (L) >60 mL/min   GFR calc Af Amer 10 (L) >60 mL/min    Comment: (NOTE) The eGFR has been calculated using the CKD EPI equation. This calculation has not been validated in all clinical situations. eGFR's persistently <60 mL/min signify possible Chronic Kidney Disease.    Anion gap 11 5 - 15  Protime-INR     Status: Abnormal   Collection Time: 09/26/15  5:20 AM  Result Value Ref Range   Prothrombin Time 25.2 (H) 11.6 - 15.2 seconds   INR 2.31 (H) 0.00 - 1.49  Glucose, capillary     Status: None   Collection Time: 09/26/15  7:08 AM  Result Value Ref Range   Glucose-Capillary 89 65 - 99 mg/dL     HEENT: normal Cardio: RRR and LVAD hum Resp: CTA  B/L and unlabored GI: BS positive and i'll distention nontender Extremity:  Pulses positive and No Edema Skin:   Intact and Other LVAD wire site clean and dry Neuro: Alert/Oriented, Normal Sensory and Abnormal Motor 4/5 bilateral deltoid, biceps, triceps, grip, hip flexor, knee extensor, ankle dorsal flexor plantar flexor Musc/Skel:  Normal and Other no pain with upper extremity lower extremity range of motion  wasn't  cervical spine Gen. no acute distress   Assessment/Plan: 1. Functional deficits secondary to Debility which require 3+ hours per day of interdisciplinary therapy in a comprehensive inpatient rehab setting. Physiatrist is providing close team supervision and 24 hour management of active medical problems listed below. Physiatrist and rehab team continue to assess barriers to discharge/monitor patient progress toward functional and medical goals. FIM:                   Function - Comprehension Comprehension: Auditory Comprehension assist level: Follows complex conversation/direction with no assist  Function - Expression Expression: Verbal Expression assist level: Expresses complex ideas: With no assist  Function - Social Interaction Social Interaction assist level: Interacts appropriately with others - No medications needed.  Function - Problem Solving Problem solving assist level: Solves complex problems: Recognizes & self-corrects  Function - Memory Patient normally able to recall (first 3 days only): Current season, Location of own room, That he or she is in a hospital   Medical Problem List and Plan: 1.  Debilitation secondary to C. Difficile/MSSA and abscess associated with LVAD. Status post irrigation debridement of chest sternal wound 09/09/2015- Initiate rehab PT/OT today 2.  DVT Prophylaxis/Anticoagulation: Coumadin therapy for history of atrial fibrillation. Monitor for any bleeding episodes 3. Pain Management: Lyrica 75 mg daily,Ultram '50mg'$  every  6 hour, Oxycodone as needed 4. Mood: Celexa 20 mg daily 5. Neuropsych: This patient is capable of making decisions on his own behalf. 6. Skin/Wound Care: Routine skin checks 7. Fluids/Electrolytes/Nutrition: Routine I&O's with follow-up chemistries 8. ID. LVAD driveline infection/MSSA. Presently day 31/42 Cefazolin and rifampin.Follow up with infectious disease Dr. Linus Salmons 9. Clostridium difficile. Twice a day vancomycin through 10/08/2015. 10. AKI. Follow-up renal services.No pending plan for dialysis at this time. Follow-up chemistries 11. Hypertension. Hydralazine 25 mg every 8 hours, Revatio 20 mg 3 times a day 12. Ileus. Resolved. Diet advance. Nausea felt to be related to antibiotics. Continue Reglan for now 13. Atrial fibrillation. Cardiac rate controlled. Continue Coumadin 14. Acute on chronic anemia. Follow-up CBC hemoglobin 8.1-9.2, 8.0 today 15.Gout.Monitor for flair up.Continue Zyloprim  LOS (Days) 1 A FACE TO FACE EVALUATION WAS PERFORMED  [name]  E 09/26/2015, 10:01 AM

## 2015-09-26 NOTE — Progress Notes (Signed)
S: feels well, Eating well, working with rehab O:Pulse 77  Temp(Src) 97.5 F (36.4 C) (Oral)  Resp 16  Wt 78.1 kg (172 lb 2.9 oz)  SpO2 100%  Intake/Output Summary (Last 24 hours) at 09/26/15 1124 Last data filed at 09/26/15 0524  Gross per 24 hour  Intake    180 ml  Output      0 ml  Net    180 ml   Weight change:  [id number] and alert CVS: RRR Resp:  Bibasilar crackles Abd: Bandage over LVAD, + BS NTND Ext: tr edema NEURO: CNI Ox3 no asterixis   . allopurinol  100 mg Oral Daily  .  ceFAZolin (ANCEF) IV  2 g Intravenous Q24H  . citalopram  20 mg Oral Daily  . docusate sodium  200 mg Oral Daily  . feeding supplement (NEPRO CARB STEADY)  237 mL Oral BID BM  . fluconazole (DIFLUCAN) IV  100 mg Intravenous Q24H  . hydrALAZINE  25 mg Oral Q8H  . metoCLOPramide  5 mg Oral Q6H  . pregabalin  75 mg Oral Daily  . rifampin  300 mg Oral Q12H  . sildenafil  20 mg Oral Q8H  . simethicone  80 mg Oral QID  . tamsulosin  0.4 mg Oral Daily  . vancomycin  125 mg Oral Q12H  . Warfarin - Pharmacist Dosing Inpatient   Does not apply q1800   No results found. BMET    Component Value Date/Time   NA 140 09/26/2015 0520   K 4.5 09/26/2015 0520   CL 109 09/26/2015 0520   CO2 20* 09/26/2015 0520   GLUCOSE 84 09/26/2015 0520   BUN 59* 09/26/2015 0520   CREATININE 6.78* 09/26/2015 0520   CREATININE 0.92 02/01/2015 1132   CALCIUM 8.6* 09/26/2015 0520   GFRNONAA 9* 09/26/2015 0520   GFRAA 10* 09/26/2015 0520   CBC    Component Value Date/Time   WBC 9.6 09/26/2015 0520   RBC 3.14* 09/26/2015 0520   HGB 8.0* 09/26/2015 0520   HCT 25.4* 09/26/2015 0520   PLT 208 09/26/2015 0520   MCV 80.9 09/26/2015 0520   MCH 25.5* 09/26/2015 0520   MCHC 31.5 09/26/2015 0520   RDW 19.6* 09/26/2015 0520   LYMPHSABS 1.1 09/26/2015 0520   MONOABS 0.9 09/26/2015 0520   EOSABS 0.2 09/26/2015 0520   BASOSABS 0.0 09/26/2015 0520     Assessment: 1. ARF, nonoliguric.  Scr fractionally higher.  UO  not recorded 2. LVAD abscess, MSSA 3. Anemia 4. Severe CHF Plan: 1.  Will start PO lasix 2. Recheck Scr in AM.  No uremic sxs.  Time will tell if will need any more HD  Heike Pounds T

## 2015-09-26 NOTE — Progress Notes (Signed)
Patient information reviewed and entered into eRehab system by Gitty Osterlund, RN, CRRN, PPS Coordinator.  Information including medical coding and functional independence measure will be reviewed and updated through discharge.    

## 2015-09-26 NOTE — Progress Notes (Signed)
Initial Nutrition Assessment  DOCUMENTATION CODES:   Not applicable  INTERVENTION:  Continue Nepro Shake po BID, each supplement provides 425 kcal and 19 grams protein.  Encourage adequate PO intake.   NUTRITION DIAGNOSIS:   Increased nutrient needs related to chronic illness as evidenced by estimated needs.  GOAL:   Patient will meet greater than or equal to 90% of their needs  MONITOR:   PO intake, Supplement acceptance, Weight trends, Labs, I & O's  REASON FOR ASSESSMENT:   Malnutrition Screening Tool    ASSESSMENT:   45 y.o. right handed male with history of systolic congestive heart failure, LVAD 12/20/2014, atrial fibrillation. Found to have C. difficile colitis placed on oral vancomycin. Developed subxiphoid swelling and subsequent had a CT which showed large abscess surrounding driveline at LVAD site. Blood cultures showed MSSA. Patient underwent irrigation and debridement of chest sternal wound 09/09/2015 with closure of abdominal wound 09/18/2015  Pt reports appetite is fine currently and PTA with usual consumption of at least 3 meals a day. No percent meal completion recorded for today, however 25-50% po the past days. Weight has been stable. Pt currently has Nepro Shake ordered and has been consuming them. Pt does report he prefers Ensure, however noted phosphorous has been elevated. Pt is agreeable on continuation on Nepro Shakes as it is more kidney friendly than Ensure.   Unable to complete Nutrition-Focused physical exam at this time. RD to perform at next visit.   Labs and medications reviewed. Phosphorous elevated at 6.7.  Diet Order:  Diet renal with fluid restriction Fluid restriction:: 1200 mL Fluid; Room service appropriate?: Yes; Fluid consistency:: Thin  Skin:   (Incision on abdomen)  Last BM:  6/8  Height:   Ht Readings from Last 1 Encounters:  08/26/15 5\' 7"  (1.702 m)    Weight:   Wt Readings from Last 1 Encounters:  09/26/15 172 lb 2.9  oz (78.1 kg)    Ideal Body Weight:  67.27 kg  BMI:  Body mass index is 26.96 kg/(m^2).  Estimated Nutritional Needs:   Kcal:  2100-2300  Protein:  105-115 grams  Fluid:  Per MD  EDUCATION NEEDS:   Education needs addressed  [name] Parker, MS, RD, LDN Pager # [phone] After hours/ weekend pager # [phone]

## 2015-09-27 ENCOUNTER — Inpatient Hospital Stay (HOSPITAL_COMMUNITY): Payer: 59 | Admitting: Physical Therapy

## 2015-09-27 ENCOUNTER — Inpatient Hospital Stay (HOSPITAL_COMMUNITY): Payer: 59 | Admitting: Occupational Therapy

## 2015-09-27 LAB — RENAL FUNCTION PANEL
Albumin: 1.9 g/dL — ABNORMAL LOW (ref 3.5–5.0)
Anion gap: 13 (ref 5–15)
BUN: 58 mg/dL — AB (ref 6–20)
CHLORIDE: 107 mmol/L (ref 101–111)
CO2: 21 mmol/L — ABNORMAL LOW (ref 22–32)
Calcium: 8.7 mg/dL — ABNORMAL LOW (ref 8.9–10.3)
Creatinine, Ser: 6.7 mg/dL — ABNORMAL HIGH (ref 0.61–1.24)
GFR calc Af Amer: 10 mL/min — ABNORMAL LOW (ref >60)
GFR, EST NON AFRICAN AMERICAN: 9 mL/min — AB (ref >60)
Glucose, Bld: 73 mg/dL (ref 65–99)
POTASSIUM: 5 mmol/L (ref 3.5–5.1)
Phosphorus: 6.8 mg/dL — ABNORMAL HIGH (ref 2.5–4.6)
Sodium: 141 mmol/L (ref 135–145)

## 2015-09-27 LAB — CBC
HEMATOCRIT: 28 % — AB (ref 39.0–52.0)
HEMOGLOBIN: 8.9 g/dL — AB (ref 13.0–17.0)
MCH: 26 pg (ref 26.0–34.0)
MCHC: 31.8 g/dL (ref 30.0–36.0)
MCV: 81.9 fL (ref 78.0–100.0)
PLATELETS: 232 10*3/uL (ref 150–400)
RBC: 3.42 MIL/uL — ABNORMAL LOW (ref 4.22–5.81)
RDW: 19 % — AB (ref 11.5–15.5)
WBC: 11.2 10*3/uL — AB (ref 4.0–10.5)

## 2015-09-27 LAB — GLUCOSE, CAPILLARY: GLUCOSE-CAPILLARY: 83 mg/dL (ref 65–99)

## 2015-09-27 LAB — PROTIME-INR
INR: 2.58 — ABNORMAL HIGH (ref 0.00–1.49)
PROTHROMBIN TIME: 27.3 s — AB (ref 11.6–15.2)

## 2015-09-27 MED ORDER — WARFARIN SODIUM 2 MG PO TABS
2.0000 mg | ORAL_TABLET | Freq: Once | ORAL | Status: AC
  Administered 2015-09-27: 2 mg via ORAL
  Filled 2015-09-27 (×2): qty 1

## 2015-09-27 NOTE — Progress Notes (Signed)
Occupational Therapy Session Note  Patient Details  Name: KINNITH OMDAHL MRN: [id number] Date of Birth: 12-03-1970  Today's Date: 09/27/2015 OT Individual Time: [id number] and [id number] OT Individual Time Calculation (min): 73 min and 68 min   Short Term Goals: Week 1:  OT Short Term Goal 1 (Week 1): STG = LTGS due to short ELOS  Skilled Therapeutic Interventions/Progress Updates:    1) Treatment session with focus on activity tolerance and dynamic standing balance.  Pt reports already bathing this AM after nurse setup for items but no physical assistance and then dressed during PT session.  Completed grooming tasks in standing with focus on standing tolerance.  Ambulated to therapy gym with supervision but no LOB or veering during ambulation this session.  Engaged in [place] sports activities in standing progressing to standing on dynamic surface of 2" foam and then wedge.  Pt with 1 LOB backwards during initial stand on wedge, but able to progress to maintaining standing balance on dynamic surfaces with supervision.  Noted ankle strategies during standing as pt would fatigue.  Intermittent rest breaks throughout session.  Returned to room as above.  2) Treatment session with focus on activity tolerance and endurance.  Pt ambulated outside hospital entrance on uneven surfaces and navigated elevator with increased time and cues for path finding.  Interacted with cardiac rehab staff as pt had just recently finished cardiac rehab.  Retrieved items from dryer in laundry room and completed folding while standing and returned items to room without assist.  Pt completed toileting task with setup assist to obtain items and then no additional assist from therapist.  Therapy Documentation Precautions:  Precautions Precautions: Fall Precaution Comments: LVAD Restrictions Weight Bearing Restrictions: No Pain:  Pt with no c/o pain  See Function Navigator for Current Functional Status.   Therapy/Group:  Individual Therapy  [name] 09/27/2015, 10:54 AM

## 2015-09-27 NOTE — Progress Notes (Signed)
HeartMate 2 Rounding Note  Subjective:     Transferred to CIR 6/7.   Creatinine still stable from yesterday at 6.7. Out 1 L so far today on po lasix per renal.   Feeling OK. Working with PT, tolerating 3+ hours of rehab.  Hgb pending. Appetite a little better.  INR 2.58  VAD INTERROGATION:  HeartMate II LVAD: Flow 4.8 liters/min, speed 9200, power 5 PI 7.8. No PI events. .   Objective:    Vital Signs:   Temp:  [97.8 F (36.6 C)] 97.8 F (36.6 C) (06/08 1619) Pulse Rate:  [76] 76 (06/08 1619) Resp:  [18] 18 (06/08 1619) SpO2:  [100 %] 100 % (06/08 1619) Weight:  [172 lb 9.9 oz (78.3 kg)] 172 lb 9.9 oz (78.3 kg) (06/09 0505) Last BM Date: 09/25/15 Mean arterial Pressure 90s  Intake/Output:   Intake/Output Summary (Last 24 hours) at 09/27/15 0905 Last data filed at 09/27/15 0300  Gross per 24 hour  Intake    460 ml  Output   1000 ml  Net   -540 ml    Physical Exam: General: In bed. NAD HEENT: Scleral icterus improving Neck: supple. JVP 6-7 cm. Carotids 2+ bilat; no bruits. No thyeomegaly or nodule noted.  R subclavian trialysis cath dressing dry Cor: Mechanical heart sounds with LVAD hum present. Lungs: CTAB, normal effort.  Abdomen: soft, some tenderness, ND, no HSM. No bruits or masses. +BS  Driveline: C/D/I; securement device intact and driveline incorporated Extremities: no cyanosis, clubbing, rash, trace edema  Neuro: A & O x3  Telemetry: NSR  Labs: Basic Metabolic Panel:  Recent Labs Lab 09/22/15 0415 09/23/15 0440 09/24/15 0455 09/25/15 0515 09/26/15 0520 09/27/15 0355  NA 140 140 141 139 140 141  K 4.1 4.5 5.1 5.1 4.5 5.0  CL 104 106 106 106 109 107  CO2 24 22 21* 20* 20* 21*  GLUCOSE 78 80 76 74 84 73  BUN 51* 55* 59* 58* 59* 58*  CREATININE 5.80* 6.29* 6.50* 6.63* 6.78* 6.70*  CALCIUM 8.7* 8.4* 8.8* 8.7* 8.6* 8.7*  PHOS 5.4* 6.3* 6.5* 6.7*  --  6.8*    Liver Function Tests:  Recent Labs Lab 09/21/15 0437 09/22/15 0415  09/23/15 0440 09/24/15 0455 09/25/15 0515 09/26/15 0520 09/27/15 0355  AST 94* 93* 75*  --   --  74*  --   ALT 18 15* 11*  --   --  9*  --   ALKPHOS 227* 222* 226*  --   --  265*  --   BILITOT 5.1* 5.5* 5.0*  --   --  4.3*  --   PROT 6.6 6.8 7.0  --   --  6.7  --   ALBUMIN 1.7* 1.7* 1.8* 1.7* 1.7* 1.8* 1.9*   No results for input(s): LIPASE, AMYLASE in the last 168 hours. No results for input(s): AMMONIA in the last 168 hours.  CBC:  Recent Labs Lab 09/23/15 0440 09/24/15 1400 09/25/15 0900 09/25/15 1529 09/26/15 0520  WBC 12.3* 13.8* 10.7* 11.2* 9.6  NEUTROABS  --   --   --   --  7.4  HGB 8.6* 9.2* 7.9* 8.8* 8.0*  HCT 26.3* 28.6* 24.9* 27.6* 25.4*  MCV 81.9 83.6 82.5 83.1 80.9  PLT 230 220 187 225 208    INR:  Recent Labs Lab 09/23/15 0440 09/24/15 0455 09/25/15 0515 09/26/15 0520 09/27/15 0355  INR 3.06* 3.18* 2.33* 2.31* 2.58*    Other results:    Imaging: No  results found.   Medications:     Scheduled Medications: . allopurinol  100 mg Oral Daily  .  ceFAZolin (ANCEF) IV  2 g Intravenous Q24H  . citalopram  20 mg Oral Daily  . docusate sodium  200 mg Oral Daily  . feeding supplement (NEPRO CARB STEADY)  237 mL Oral BID BM  . fluconazole (DIFLUCAN) IV  100 mg Intravenous Q24H  . furosemide  80 mg Oral BID  . hydrALAZINE  25 mg Oral Q8H  . metoCLOPramide  5 mg Oral Q6H  . pregabalin  75 mg Oral Daily  . rifampin  300 mg Oral Q12H  . sildenafil  20 mg Oral Q8H  . simethicone  80 mg Oral QID  . tamsulosin  0.4 mg Oral Daily  . vancomycin  125 mg Oral Q12H  . Warfarin - Pharmacist Dosing Inpatient   Does not apply q1800    Infusions:    PRN Medications: acetaminophen, Gerhardt's butt cream, ondansetron **OR** ondansetron (ZOFRAN) IV, oxyCODONE, sodium chloride flush, sorbitol, traMADol   Assessment:    1. LVAD Complication- Driveline abscess 2. MSSA bacteremia -> septic shock 3. Chronic systolic HF s/p VAD placement 9/16 4. C.  Difficile colitis 5. 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 Acute blood loss anemia 8. Severe Malnutrition.  9. AKI - currently requiring HD.  10. Klebsiella (ESBL) pneumonia 11. Funguria   Plan/Discussion:    Making progress. Urine output ok. Creatinine stable, Renal following for HD recommendations. Continue to follow.   Remains on cefazolin + rifampin for MSSA bacteremia and oral vanc for C difficile. Klebseilla PNA has been treated. PO intake has improved.   Continue Ancef, oral Vanc, and Rifampin through June 20th.   Urine with >100K yeast. Day 6/7 fluconazole per ID. Complete fluconazole through June 10th.   Maps better. Continue hydralazine 25 mg every 8 hrs. Continue for now. Weight and volume status stable.   Dr. Prescott Gum following wound.   VAD parameters ok.  Appreciate CIR.   NSR today. Off amio with elevated LFTs.   I reviewed the LVAD parameters from today, and compared the results to the patient's prior recorded data.  No programming changes were made.  The LVAD is functioning within specified parameters.  The patient performs LVAD self-test daily.  LVAD interrogation was negative for any significant power changes, alarms or PI events/speed drops.  LVAD equipment check completed and is in good working order.  Back-up equipment present.   LVAD education done on emergency procedures and precautions and reviewed exit site care.  Length of Stay: 2  Annamaria Helling 09/27/2015, 9:05 AM  VAD Team --- VAD ISSUES ONLY--- Pager [phone] (7am - 7am)  Advanced Heart Failure Team  Pager [phone] (M-F; 7a - 4p)  Please contact [facility] for night-coverage after hours (4p -7a ) and weekends on [email or web address]  Patient seen with PA, agree with the above note.  He is stable, working with PT.  Renal following for HD needs but making urine now and creatinine stable.  Continue po Lasix.  Continuing antimicrobials as outlined  above.   Loralie Champagne 09/27/2015 5:22 PM

## 2015-09-27 NOTE — Progress Notes (Signed)
S: feels well, Eating well.  No new CO.  Urinated well yest O:Pulse 76  Temp(Src) 97.8 F (36.6 C) (Oral)  Resp 18  Wt 78.3 kg (172 lb 9.9 oz)  SpO2 100%  Intake/Output Summary (Last 24 hours) at 09/27/15 1140 Last data filed at 09/27/15 0300  Gross per 24 hour  Intake    460 ml  Output   1000 ml  Net   -540 ml   Weight change: 0.2 kg (7.1 oz) IN:[id number] and alert CVS: RRR Resp:  Bibasilar crackles Abd: Bandage over LVAD, + BS NTND Ext: tr edema NEURO: CNI Ox3 no asterixis RT IJ permcath   . allopurinol  100 mg Oral Daily  .  ceFAZolin (ANCEF) IV  2 g Intravenous Q24H  . citalopram  20 mg Oral Daily  . docusate sodium  200 mg Oral Daily  . feeding supplement (NEPRO CARB STEADY)  237 mL Oral BID BM  . fluconazole (DIFLUCAN) IV  100 mg Intravenous Q24H  . furosemide  80 mg Oral BID  . hydrALAZINE  25 mg Oral Q8H  . metoCLOPramide  5 mg Oral Q6H  . pregabalin  75 mg Oral Daily  . rifampin  300 mg Oral Q12H  . sildenafil  20 mg Oral Q8H  . simethicone  80 mg Oral QID  . tamsulosin  0.4 mg Oral Daily  . vancomycin  125 mg Oral Q12H  . Warfarin - Pharmacist Dosing Inpatient   Does not apply q1800   No results found. BMET    Component Value Date/Time   NA 141 09/27/2015 0355   K 5.0 09/27/2015 0355   CL 107 09/27/2015 0355   CO2 21* 09/27/2015 0355   GLUCOSE 73 09/27/2015 0355   BUN 58* 09/27/2015 0355   CREATININE 6.70* 09/27/2015 0355   CREATININE 0.92 02/01/2015 1132   CALCIUM 8.7* 09/27/2015 0355   GFRNONAA 9* 09/27/2015 0355   GFRAA 10* 09/27/2015 0355   CBC    Component Value Date/Time   WBC 11.2* 09/27/2015 1100   RBC 3.42* 09/27/2015 1100   HGB 8.9* 09/27/2015 1100   HCT 28.0* 09/27/2015 1100   PLT 232 09/27/2015 1100   MCV 81.9 09/27/2015 1100   MCH 26.0 09/27/2015 1100   MCHC 31.8 09/27/2015 1100   RDW 19.0* 09/27/2015 1100   LYMPHSABS 1.1 09/26/2015 0520   MONOABS 0.9 09/26/2015 0520   EOSABS 0.2 09/26/2015 0520   BASOSABS 0.0 09/26/2015  0520     Assessment: 1. ARF, nonoliguric.  Scr fractionally lower, hopefully this means renal fx will start to improve.  UO > 1L as entire shift not recorded 2. LVAD abscess, MSSA 3. Anemia 4. Severe CHF Plan: 1.  Recheck labs in AM 2. Cont po lasix  Jo Booze T

## 2015-09-27 NOTE — IPOC Note (Signed)
Overall Plan of Care [facility]) Patient Details Name: [name] MRN: [id number] DOB: [birth year]  Admitting Diagnosis: Debility , LVAD  Hospital Problems: Principal Problem:   Wound infection ([place]) Active Problems:   SYSTOLIC HEART FAILURE, CHRONIC   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[place])   Staphylococcus aureus bacteremia   Debilitated     Functional Problem List: Nursing Edema, Medication Management, Pain, Safety, Skin Integrity, Bowel, Bladder  PT Balance, Endurance  OT Balance, Endurance, Motor, Safety  SLP    TR         Basic ADL's: OT Grooming, Bathing, Dressing, Toileting     Advanced  ADL's: OT Full Meal Preparation, Laundry, Light Housekeeping     Transfers: PT Bed to Chair, Bed Mobility, Car, Floor  OT Toilet     Locomotion: PT Ambulation, Stairs     Additional Impairments: OT None  SLP        TR      Anticipated Outcomes Item Anticipated Outcome  Self Feeding No goal  Swallowing      Basic self-care  Mod I  Toileting  Mod i   Bathroom Transfers Mod I  Bowel/Bladder  Continent of bowel and bladder LBM 6/7  Transfers  independent  Locomotion  independent   Communication     Cognition     Pain  <4  Safety/Judgment  Supervision   Therapy Plan: PT Intensity: Minimum of 1-2 x/day ,45 to 90 minutes PT Frequency: 5 out of 7 days PT Duration Estimated Length of Stay: 7 days  OT Intensity: Minimum of 1-2 x/day, 45 to 90 minutes OT Frequency: 5 out of 7 days OT Duration/Estimated Length of Stay: 7-9 days         Team Interventions: Nursing Interventions Patient/Family Education, Bowel Management, Bladder Management, Pain Management, Medication Management, Disease Management/Prevention, Skin Care/Wound Management  PT interventions Ambulation/gait training, Balance/vestibular training, Discharge planning, Neuromuscular re-education, UE/LE Coordination activities, UE/LE Strength taining/ROM, Therapeutic Exercise, Therapeutic  Activities, Patient/family education, Stair training, DME/adaptive equipment instruction, Functional mobility training, Community reintegration  OT Interventions Training and development officer, Academic librarian, Discharge planning, Disease mangement/prevention, Engineer, drilling, Functional mobility training, Patient/family education, Psychosocial support, Self Care/advanced ADL retraining, Therapeutic Activities, Therapeutic Exercise, UE/LE Strength taining/ROM  SLP Interventions    TR Interventions    SW/CM Interventions Discharge Planning, Psychosocial Support, Patient/Family Education    Team Discharge Planning: Destination: PT-Home ,OT- Home , SLP-  Projected Follow-up: PT-Outpatient PT, Home health PT, OT-  None, SLP-  Projected Equipment Needs: PT-None recommended by PT, Other (comment) (will continue to assess; None currently ), OT- To be determined, SLP-  Equipment Details: PT- , OT-  Patient/family involved in discharge planning: PT- Patient,  OT-Patient, SLP-   MD ELOS: 7 days Medical Rehab Prognosis:  Excellent Assessment: The patient has been admitted for CIR therapies with the diagnosis of debility related to LVAD infection. The team will be addressing functional mobility, strength, stamina, balance, safety, adaptive techniques and equipment, self-care, bowel and bladder mgt, patient and caregiver education, wound care, close CV monitoring, ego support, community reintegration. Goals have been set at mod I for mobility and self-care.    Meredith Staggers, MD, FAAPMR      See Team Conference Notes for weekly updates to the plan of care

## 2015-09-27 NOTE — Progress Notes (Signed)
ANTICOAGULATION CONSULT NOTE   Pharmacy Consult for Coumadin Indication: LVAD / yeast in urine  Allergies  Allergen Reactions  . Phytonadione Other (See Comments)    Patient has LVAD: please check with LVAD coordinator on call or LVAD MD on call before reversal of anticoagulation with vit k    Patient Measurements: Weight: 172 lb 9.9 oz (78.3 kg) Heparin Dosing Weight: n/a  Vital Signs: Temp: 97.7 F (36.5 C) (06/09 1544) Temp Source: Oral (06/09 1544)  Labs:  Recent Labs  09/25/15 0515  09/25/15 1529 09/26/15 0520 09/27/15 0355 09/27/15 1100  HGB  --   < > 8.8* 8.0*  --  8.9*  HCT  --   < > 27.6* 25.4*  --  28.0*  PLT  --   < > 225 208  --  232  LABPROT 25.3*  --   --  25.2* 27.3*  --   INR 2.33*  --   --  2.31* 2.58*  --   CREATININE 6.63*  --   --  6.78* 6.70*  --   < > = values in this interval not displayed.  Estimated Creatinine Clearance: 12.7 mL/min (by C-G formula based on Cr of 6.7).   Medications:  Scheduled:  . allopurinol  100 mg Oral Daily  .  ceFAZolin (ANCEF) IV  2 g Intravenous Q24H  . citalopram  20 mg Oral Daily  . docusate sodium  200 mg Oral Daily  . feeding supplement (NEPRO CARB STEADY)  237 mL Oral BID BM  . fluconazole (DIFLUCAN) IV  100 mg Intravenous Q24H  . furosemide  80 mg Oral BID  . hydrALAZINE  25 mg Oral Q8H  . metoCLOPramide  5 mg Oral Q6H  . pregabalin  75 mg Oral Daily  . rifampin  300 mg Oral Q12H  . sildenafil  20 mg Oral Q8H  . simethicone  80 mg Oral QID  . tamsulosin  0.4 mg Oral Daily  . vancomycin  125 mg Oral Q12H  . warfarin  2 mg Oral ONCE-1800  . Warfarin - Pharmacist Dosing Inpatient   Does not apply q1800    Assessment: 45 yo male with LVAD on chronic Coumadin.  Pharmacy now dosing at [place] request.  Patient receiving ancef and rifampin for driveline infection and MSSA bacteremia.  Also on fluconazole for yeast in urine.  INR now stablizing, anticipate difficulty keeping in range given drug interactions  with both rifampin and fluconazole.  No bleeding or complications noted.  5/2 Nafcillin>5/15 5/15 imip>5/18 5/18 CTX > 5/23 5/23 Ancef> (6/20) 5/2 po vanc> (6/20) 5/15 vanc IV> 5/17 5/8 rifampin> (6/20) 6/4 fluconazole> per ID limit to 7 days- d/c 6/10 - stop entered in computer  5/9 Abd abscess cx > MSSA 5/9 BCx x 2 > ngtd 5/15 sputum > Klebsiella (pan S except to ampicillin) 5/15 urine > negative 5/15 blood x 1 > ngtd 5/28 cdiff - neg 5/30 bld x2 -ngtd 5/30 urine - >100k yeast 6/3 urine - >100K yeast  INR 2.3>2.6 with warfarin 4mg  - will drop dose tonight   Goal of Therapy:  INR 2-2.5 Monitor platelets by anticoagulation protocol: Yes   Plan:  1. Coumadin 2 mg x 1 tonight. 2. F/u daily INR   Bonnita Nasuti Pharm.D. CPP, BCPS Clinical Pharmacist [phone] 09/27/2015 4:56 PM

## 2015-09-27 NOTE — Progress Notes (Signed)
Subjective/Complaints:  No issues overnight. ambulating with therapy  Without assistive device  ROS- negative for chest pain, shortness of breath, nausea, vomiting, diarrhea,, constipation  Objective: Vital Signs: Pulse 76, temperature 97.8 F (36.6 C), temperature source Oral, resp. rate 18, weight 78.3 kg (172 lb 9.9 oz), SpO2 100 %. No results found. Results for orders placed or performed during the hospital encounter of 09/25/15 (from the past 72 hour(s))  CBC WITH DIFFERENTIAL     Status: Abnormal   Collection Time: 09/26/15  5:20 AM  Result Value Ref Range   WBC 9.6 4.0 - 10.5 K/uL   RBC 3.14 (L) 4.22 - 5.81 MIL/uL   Hemoglobin 8.0 (L) 13.0 - 17.0 g/dL   HCT 25.4 (L) 39.0 - 52.0 %   MCV 80.9 78.0 - 100.0 fL   MCH 25.5 (L) 26.0 - 34.0 pg   MCHC 31.5 30.0 - 36.0 g/dL   RDW 19.6 (H) 11.5 - 15.5 %   Platelets 208 150 - 400 K/uL   Neutrophils Relative % 77 %   Neutro Abs 7.4 1.7 - 7.7 K/uL   Lymphocytes Relative 12 %   Lymphs Abs 1.1 0.7 - 4.0 K/uL   Monocytes Relative 9 %   Monocytes Absolute 0.9 0.1 - 1.0 K/uL   Eosinophils Relative 2 %   Eosinophils Absolute 0.2 0.0 - 0.7 K/uL   Basophils Relative 0 %   Basophils Absolute 0.0 0.0 - 0.1 K/uL  Comprehensive metabolic panel     Status: Abnormal   Collection Time: 09/26/15  5:20 AM  Result Value Ref Range   Sodium 140 135 - 145 mmol/L   Potassium 4.5 3.5 - 5.1 mmol/L   Chloride 109 101 - 111 mmol/L   CO2 20 (L) 22 - 32 mmol/L   Glucose, Bld 84 65 - 99 mg/dL   BUN 59 (H) 6 - 20 mg/dL   Creatinine, Ser 6.78 (H) 0.61 - 1.24 mg/dL   Calcium 8.6 (L) 8.9 - 10.3 mg/dL   Total Protein 6.7 6.5 - 8.1 g/dL   Albumin 1.8 (L) 3.5 - 5.0 g/dL   AST 74 (H) 15 - 41 U/L   ALT 9 (L) 17 - 63 U/L   Alkaline Phosphatase 265 (H) 38 - 126 U/L   Total Bilirubin 4.3 (H) 0.3 - 1.2 mg/dL   GFR calc non Af Amer 9 (L) >60 mL/min   GFR calc Af Amer 10 (L) >60 mL/min    Comment: (NOTE) The eGFR has been calculated using the CKD EPI  equation. This calculation has not been validated in all clinical situations. eGFR's persistently <60 mL/min signify possible Chronic Kidney Disease.    Anion gap 11 5 - 15  Protime-INR     Status: Abnormal   Collection Time: 09/26/15  5:20 AM  Result Value Ref Range   Prothrombin Time 25.2 (H) 11.6 - 15.2 seconds   INR 2.31 (H) 0.00 - 1.49  Glucose, capillary     Status: None   Collection Time: 09/26/15  7:08 AM  Result Value Ref Range   Glucose-Capillary 89 65 - 99 mg/dL  Glucose, capillary     Status: None   Collection Time: 09/26/15 11:54 AM  Result Value Ref Range   Glucose-Capillary 88 65 - 99 mg/dL  Protime-INR     Status: Abnormal   Collection Time: 09/27/15  3:55 AM  Result Value Ref Range   Prothrombin Time 27.3 (H) 11.6 - 15.2 seconds   INR 2.58 (H) 0.00 - 1.49  Renal function panel     Status: Abnormal   Collection Time: 09/27/15  3:55 AM  Result Value Ref Range   Sodium 141 135 - 145 mmol/L   Potassium 5.0 3.5 - 5.1 mmol/L   Chloride 107 101 - 111 mmol/L   CO2 21 (L) 22 - 32 mmol/L   Glucose, Bld 73 65 - 99 mg/dL   BUN 58 (H) 6 - 20 mg/dL   Creatinine, Ser 6.70 (H) 0.61 - 1.24 mg/dL   Calcium 8.7 (L) 8.9 - 10.3 mg/dL   Phosphorus 6.8 (H) 2.5 - 4.6 mg/dL   Albumin 1.9 (L) 3.5 - 5.0 g/dL   GFR calc non Af Amer 9 (L) >60 mL/min   GFR calc Af Amer 10 (L) >60 mL/min    Comment: (NOTE) The eGFR has been calculated using the CKD EPI equation. This calculation has not been validated in all clinical situations. eGFR's persistently <60 mL/min signify possible Chronic Kidney Disease.    Anion gap 13 5 - 15  Glucose, capillary     Status: None   Collection Time: 09/27/15  6:48 AM  Result Value Ref Range   Glucose-Capillary 83 65 - 99 mg/dL     HEENT: normal Cardio: RRR and LVAD hum Resp: CTA B/L and unlabored GI: BS positive and i'll distention nontender Extremity:  Pulses positive and No Edema Skin:   Intact and Other LVAD wire site clean and dry Neuro:  Alert/Oriented, Normal Sensory and Abnormal Motor 4/5 bilateral deltoid, biceps, triceps, grip, hip flexor, knee extensor, ankle dorsal flexor plantar flexor Musc/Skel:  Normal and Other no pain with upper extremity lower extremity range of motion  wasn't  cervical spine Gen. no acute distress   Assessment/Plan: 1. Functional deficits secondary to Debility which require 3+ hours per day of interdisciplinary therapy in a comprehensive inpatient rehab setting. Physiatrist is providing close team supervision and 24 hour management of active medical problems listed below. Physiatrist and rehab team continue to assess barriers to discharge/monitor patient progress toward functional and medical goals. FIM: Function - Bathing Bathing activity did not occur:  (pt reports he completed prior to session)  Function- Upper Body Dressing/Undressing Upper body dressing/undressing activity did not occur:  (no clothes on eval) Function - Lower Body Dressing/Undressing What is the patient wearing?: Non-skid slipper socks, Ted Hose Position: Sitting EOB Non-skid slipper socks- Performed by patient: Don/doff right sock, Don/doff left sock TED Hose - Performed by patient: Don/doff right TED hose, Don/doff left TED hose Assist for footwear: Setup Assist for lower body dressing: No Help, No cues     Function - Air cabin crew transfer assistive device: Grab bar Assist level to toilet: Touching or steadying assistance (Pt > 75%) Assist level from toilet: Touching or steadying assistance (Pt > 75%)  Function - Chair/bed transfer Chair/bed transfer assist level: Supervision or verbal cues Chair/bed transfer details: Verbal cues for technique, Verbal cues for sequencing  Function - Locomotion: Wheelchair Will patient use wheelchair at discharge?: No Function - Locomotion: Ambulation Assistive device: No device Max distance: 150 Assist level: Touching or steadying assistance (Pt > 75%) Assist  level: Supervision or verbal cues Assist level: Supervision or verbal cues Assist level: Touching or steadying assistance (Pt > 75%) Assist level: Touching or steadying assistance (Pt > 75%)  Function - Comprehension Comprehension: Auditory Comprehension assist level: Follows complex conversation/direction with no assist  Function - Expression Expression: Verbal Expression assist level: Expresses complex ideas: With no assist  Function - Social Interaction Social  Interaction assist level: Interacts appropriately with others - No medications needed.  Function - Problem Solving Problem solving assist level: Solves complex problems: Recognizes & self-corrects  Function - Memory Memory assist level: Complete Independence: No helper Patient normally able to recall (first 3 days only): Current season, Location of own room, That he or she is in a hospital, Staff names and faces   Medical Problem List and Plan: 1.  Debilitation secondary to C. Difficile/MSSA and abscess associated with LVAD. Status post irrigation debridement of chest sternal wound 09/09/2015- Cont rehab PT/OT today 2.  DVT Prophylaxis/Anticoagulation: Coumadin therapy for history of atrial fibrillation. Monitor for any bleeding episodes, pharmacy protocol 3. Pain Management: Lyrica 75 mg daily,Ultram '50mg'$  every 6 hour, Oxycodone as needed 4. Mood: Celexa 20 mg daily 5. Neuropsych: This patient is capable of making decisions on his own behalf. 6. Skin/Wound Care: Routine skin checks 7. Fluids/Electrolytes/Nutrition: Routine I&O's with follow-up chemistries 8. ID. LVAD driveline infection/MSSA. Presently day 32/42 Cefazolin and rifampin.Follow up with infectious disease Dr. Linus Salmons 9. Clostridium difficile. Twice a day vancomycin through 10/08/2015. 10. AKI. Follow-up renal services.No pending plan for dialysis at this time. Follow-up chemistries 11. Hypertension. Hydralazine 25 mg every 8 hours, Revatio 20 mg 3 times a  day 12. Ileus. Resolved. Diet advance. Nausea felt to be related to antibiotics. Continue Reglan for now 13. Atrial fibrillation. Cardiac rate controlled. Continue Coumadin 14. Acute on chronic anemia. Follow-up CBC hemoglobin 8.1-9.2, 8.0 today 15.Gout.Monitor for flare up.Continue Zyloprim  LOS (Days) 2 A FACE TO FACE EVALUATION WAS PERFORMED  Brondon Wann E 09/27/2015, 9:12 AM

## 2015-09-27 NOTE — Progress Notes (Signed)
Physical Therapy Session Note  Patient Details  Name: [name] MRN: [id number] Date of Birth: [birth year]  Today's Date: 09/27/2015 PT Individual Time: 0800-0900 PT Individual Time Calculation (min): 60 min   Short Term Goals: Week 1:  PT Short Term Goal 1 (Week 1): STGs = LTGs      Therapy Documentation Precautions:  Precautions Precautions: Fall Precaution Comments: LVAD Restrictions Weight Bearing Restrictions: No   Bed mobility independent rolling left and right and supine to and from short sit  Sit to and from stand transfer supervision from lower surfaces and cues for controlled descent.   Patient ambulated 225 feet x4 close supervision. Patient ambulated over level tile surfaces, and outdoor uneven and un level surfaces.  Patient ambulated with a step through gait pattern. Patient requires rest breaks throughout secondary to decreased cardiovascular endurance. Patient demonstrates improvements with postural sway. Patient noted with minimal loss of balance to the right able to recover without assistance. Verbal cues throughout for pacing and environmental awareness.   Patient up and down 15 outdoor steps with left handrail close supervision. Verbal cues for foot placement, step to pattern.   Standing ther ex:  B heel raies 3x10 Mini squats 3x10 B marching 3x10  Patient tolerated tx well. Patient required rest breaks throughout session. Patient returned to room at end of session with all needs met. Patient educated not to be up without assistance. Patient in agreement with recommendation and verbalized understanding.   See Function Navigator for Current Functional Status.   Therapy/Group: Individual Therapy  Retta Diones 09/27/2015, 10:13 AM

## 2015-09-27 NOTE — Progress Notes (Signed)
Social Work Patient ID: Inez Catalina, male   DOB: May 17, 1970, 45 y.o.   MRN: [id number] Team feels pt will be ready for discharge Tuesday, checking with MD before telling pt. Will work on IV antibiotics and have spoken with [facility] regarding this need. She reports she was following while in ICU. Will work toward discharge Tuesday if ok with MD.

## 2015-09-28 ENCOUNTER — Inpatient Hospital Stay (HOSPITAL_COMMUNITY): Payer: 59 | Admitting: Occupational Therapy

## 2015-09-28 ENCOUNTER — Inpatient Hospital Stay (HOSPITAL_COMMUNITY): Payer: 59 | Admitting: Physical Therapy

## 2015-09-28 LAB — RENAL FUNCTION PANEL
ALBUMIN: 1.8 g/dL — AB (ref 3.5–5.0)
ANION GAP: 13 (ref 5–15)
BUN: 56 mg/dL — ABNORMAL HIGH (ref 6–20)
CALCIUM: 8.9 mg/dL (ref 8.9–10.3)
CO2: 25 mmol/L (ref 22–32)
CREATININE: 6.56 mg/dL — AB (ref 0.61–1.24)
Chloride: 102 mmol/L (ref 101–111)
GFR calc Af Amer: 10 mL/min — ABNORMAL LOW (ref >60)
GFR calc non Af Amer: 9 mL/min — ABNORMAL LOW (ref >60)
GLUCOSE: 86 mg/dL (ref 65–99)
PHOSPHORUS: 7.4 mg/dL — AB (ref 2.5–4.6)
Potassium: 4.1 mmol/L (ref 3.5–5.1)
SODIUM: 140 mmol/L (ref 135–145)

## 2015-09-28 LAB — CBC WITH DIFFERENTIAL/PLATELET
BASOS PCT: 1 %
Basophils Absolute: 0.1 10*3/uL (ref 0.0–0.1)
EOS ABS: 0.2 10*3/uL (ref 0.0–0.7)
EOS PCT: 3 %
HCT: 25.5 % — ABNORMAL LOW (ref 39.0–52.0)
HEMOGLOBIN: 8 g/dL — AB (ref 13.0–17.0)
LYMPHS ABS: 1.5 10*3/uL (ref 0.7–4.0)
Lymphocytes Relative: 16 %
MCH: 25.8 pg — AB (ref 26.0–34.0)
MCHC: 31.4 g/dL (ref 30.0–36.0)
MCV: 82.3 fL (ref 78.0–100.0)
MONOS PCT: 8 %
Monocytes Absolute: 0.7 10*3/uL (ref 0.1–1.0)
NEUTROS PCT: 73 %
Neutro Abs: 6.7 10*3/uL (ref 1.7–7.7)
PLATELETS: 187 10*3/uL (ref 150–400)
RBC: 3.1 MIL/uL — AB (ref 4.22–5.81)
RDW: 18.8 % — ABNORMAL HIGH (ref 11.5–15.5)
WBC: 9.2 10*3/uL (ref 4.0–10.5)

## 2015-09-28 LAB — PROTIME-INR
INR: 3.24 — ABNORMAL HIGH (ref 0.00–1.49)
PROTHROMBIN TIME: 32.5 s — AB (ref 11.6–15.2)

## 2015-09-28 MED ORDER — GUAIFENESIN 100 MG/5ML PO SOLN
10.0000 mL | ORAL | Status: DC | PRN
  Administered 2015-09-28 – 2015-09-30 (×3): 200 mg via ORAL
  Filled 2015-09-28: qty 25
  Filled 2015-09-28: qty 5
  Filled 2015-09-28: qty 10
  Filled 2015-09-28 (×2): qty 5

## 2015-09-28 NOTE — Progress Notes (Signed)
Subjective/Complaints:  Sitting EOB. No complaints. Denies pain. Getting stronger but still lacks endurance.   ROS- negative for chest pain, shortness of breath, nausea, vomiting, diarrhea,, constipation  Objective: Vital Signs: Pulse 76, temperature 97.7 F (36.5 C), temperature source Oral, resp. rate 18, weight 78.3 kg (172 lb 9.9 oz), SpO2 100 %. No results found. Results for orders placed or performed during the hospital encounter of 09/25/15 (from the past 72 hour(s))  CBC WITH DIFFERENTIAL     Status: Abnormal   Collection Time: 09/26/15  5:20 AM  Result Value Ref Range   WBC 9.6 4.0 - 10.5 K/uL   RBC 3.14 (L) 4.22 - 5.81 MIL/uL   Hemoglobin 8.0 (L) 13.0 - 17.0 g/dL   HCT 25.4 (L) 39.0 - 52.0 %   MCV 80.9 78.0 - 100.0 fL   MCH 25.5 (L) 26.0 - 34.0 pg   MCHC 31.5 30.0 - 36.0 g/dL   RDW 19.6 (H) 11.5 - 15.5 %   Platelets 208 150 - 400 K/uL   Neutrophils Relative % 77 %   Neutro Abs 7.4 1.7 - 7.7 K/uL   Lymphocytes Relative 12 %   Lymphs Abs 1.1 0.7 - 4.0 K/uL   Monocytes Relative 9 %   Monocytes Absolute 0.9 0.1 - 1.0 K/uL   Eosinophils Relative 2 %   Eosinophils Absolute 0.2 0.0 - 0.7 K/uL   Basophils Relative 0 %   Basophils Absolute 0.0 0.0 - 0.1 K/uL  Comprehensive metabolic panel     Status: Abnormal   Collection Time: 09/26/15  5:20 AM  Result Value Ref Range   Sodium 140 135 - 145 mmol/L   Potassium 4.5 3.5 - 5.1 mmol/L   Chloride 109 101 - 111 mmol/L   CO2 20 (L) 22 - 32 mmol/L   Glucose, Bld 84 65 - 99 mg/dL   BUN 59 (H) 6 - 20 mg/dL   Creatinine, Ser 6.78 (H) 0.61 - 1.24 mg/dL   Calcium 8.6 (L) 8.9 - 10.3 mg/dL   Total Protein 6.7 6.5 - 8.1 g/dL   Albumin 1.8 (L) 3.5 - 5.0 g/dL   AST 74 (H) 15 - 41 U/L   ALT 9 (L) 17 - 63 U/L   Alkaline Phosphatase 265 (H) 38 - 126 U/L   Total Bilirubin 4.3 (H) 0.3 - 1.2 mg/dL   GFR calc non Af Amer 9 (L) >60 mL/min   GFR calc Af Amer 10 (L) >60 mL/min    Comment: (NOTE) The eGFR has been calculated using the  CKD EPI equation. This calculation has not been validated in all clinical situations. eGFR's persistently <60 mL/min signify possible Chronic Kidney Disease.    Anion gap 11 5 - 15  Protime-INR     Status: Abnormal   Collection Time: 09/26/15  5:20 AM  Result Value Ref Range   Prothrombin Time 25.2 (H) 11.6 - 15.2 seconds   INR 2.31 (H) 0.00 - 1.49  Glucose, capillary     Status: None   Collection Time: 09/26/15  7:08 AM  Result Value Ref Range   Glucose-Capillary 89 65 - 99 mg/dL  Glucose, capillary     Status: None   Collection Time: 09/26/15 11:54 AM  Result Value Ref Range   Glucose-Capillary 88 65 - 99 mg/dL  Protime-INR     Status: Abnormal   Collection Time: 09/27/15  3:55 AM  Result Value Ref Range   Prothrombin Time 27.3 (H) 11.6 - 15.2 seconds   INR 2.58 (H)  0.00 - 1.49  Renal function panel     Status: Abnormal   Collection Time: 09/27/15  3:55 AM  Result Value Ref Range   Sodium 141 135 - 145 mmol/L   Potassium 5.0 3.5 - 5.1 mmol/L   Chloride 107 101 - 111 mmol/L   CO2 21 (L) 22 - 32 mmol/L   Glucose, Bld 73 65 - 99 mg/dL   BUN 58 (H) 6 - 20 mg/dL   Creatinine, Ser 6.70 (H) 0.61 - 1.24 mg/dL   Calcium 8.7 (L) 8.9 - 10.3 mg/dL   Phosphorus 6.8 (H) 2.5 - 4.6 mg/dL   Albumin 1.9 (L) 3.5 - 5.0 g/dL   GFR calc non Af Amer 9 (L) >60 mL/min   GFR calc Af Amer 10 (L) >60 mL/min    Comment: (NOTE) The eGFR has been calculated using the CKD EPI equation. This calculation has not been validated in all clinical situations. eGFR's persistently <60 mL/min signify possible Chronic Kidney Disease.    Anion gap 13 5 - 15  Glucose, capillary     Status: None   Collection Time: 09/27/15  6:48 AM  Result Value Ref Range   Glucose-Capillary 83 65 - 99 mg/dL  CBC     Status: Abnormal   Collection Time: 09/27/15 11:00 AM  Result Value Ref Range   WBC 11.2 (H) 4.0 - 10.5 K/uL   RBC 3.42 (L) 4.22 - 5.81 MIL/uL   Hemoglobin 8.9 (L) 13.0 - 17.0 g/dL   HCT 28.0 (L) 39.0 - 52.0  %   MCV 81.9 78.0 - 100.0 fL   MCH 26.0 26.0 - 34.0 pg   MCHC 31.8 30.0 - 36.0 g/dL   RDW 19.0 (H) 11.5 - 15.5 %   Platelets 232 150 - 400 K/uL  Protime-INR     Status: Abnormal   Collection Time: 09/28/15  6:00 AM  Result Value Ref Range   Prothrombin Time 32.5 (H) 11.6 - 15.2 seconds   INR 3.24 (H) 0.00 - 1.49  CBC with Differential/Platelet     Status: Abnormal   Collection Time: 09/28/15  6:00 AM  Result Value Ref Range   WBC 9.2 4.0 - 10.5 K/uL   RBC 3.10 (L) 4.22 - 5.81 MIL/uL   Hemoglobin 8.0 (L) 13.0 - 17.0 g/dL   HCT 25.5 (L) 39.0 - 52.0 %   MCV 82.3 78.0 - 100.0 fL   MCH 25.8 (L) 26.0 - 34.0 pg   MCHC 31.4 30.0 - 36.0 g/dL   RDW 18.8 (H) 11.5 - 15.5 %   Platelets 187 150 - 400 K/uL    Comment: PLATELET COUNT CONFIRMED BY SMEAR   Neutrophils Relative % 73 %   Neutro Abs 6.7 1.7 - 7.7 K/uL   Lymphocytes Relative 16 %   Lymphs Abs 1.5 0.7 - 4.0 K/uL   Monocytes Relative 8 %   Monocytes Absolute 0.7 0.1 - 1.0 K/uL   Eosinophils Relative 3 %   Eosinophils Absolute 0.2 0.0 - 0.7 K/uL   Basophils Relative 1 %   Basophils Absolute 0.1 0.0 - 0.1 K/uL  Renal function panel     Status: Abnormal   Collection Time: 09/28/15  6:00 AM  Result Value Ref Range   Sodium 140 135 - 145 mmol/L   Potassium 4.1 3.5 - 5.1 mmol/L    Comment: DELTA CHECK NOTED   Chloride 102 101 - 111 mmol/L   CO2 25 22 - 32 mmol/L   Glucose, Bld 86 65 - 99  mg/dL   BUN 56 (H) 6 - 20 mg/dL   Creatinine, Ser 6.56 (H) 0.61 - 1.24 mg/dL   Calcium 8.9 8.9 - 10.3 mg/dL   Phosphorus 7.4 (H) 2.5 - 4.6 mg/dL   Albumin 1.8 (L) 3.5 - 5.0 g/dL   GFR calc non Af Amer 9 (L) >60 mL/min   GFR calc Af Amer 10 (L) >60 mL/min    Comment: (NOTE) The eGFR has been calculated using the CKD EPI equation. This calculation has not been validated in all clinical situations. eGFR's persistently <60 mL/min signify possible Chronic Kidney Disease.    Anion gap 13 5 - 15     HEENT: normal Cardio: RRR and LVAD  hum Resp: CTA B/L and unlabored GI: BS positive and i'll distention nontender Extremity:  Pulses positive and No Edema Skin:   Intact and Other LVAD wire site remains clean and dry Neuro: Alert/Oriented, Normal Sensory and Abnormal Motor 4/5 bilateral deltoid, biceps, triceps, grip, hip flexor, knee extensor, ankle dorsal flexor plantar flexor Musc/Skel:  Normal and Other no pain with upper extremity lower extremity range of motion  wasn't  cervical spine Gen. no acute distress   Assessment/Plan: 1. Functional deficits secondary to Debility which require 3+ hours per day of interdisciplinary therapy in a comprehensive inpatient rehab setting. Physiatrist is providing close team supervision and 24 hour management of active medical problems listed below. Physiatrist and rehab team continue to assess barriers to discharge/monitor patient progress toward functional and medical goals. FIM: Function - Bathing Bathing activity did not occur:  (pt reports he completed prior to session) Position: Sitting EOB (standing for majority of bathing, seated EOB to wash lower legs and feet per pt report) Body parts bathed by patient: Right arm, Left arm, Chest, Abdomen, Front perineal area, Buttocks, Right upper leg, Left upper leg, Right lower leg, Left lower leg, Back Assist Level: Set up Set up : To obtain items  Function- Upper Body Dressing/Undressing Upper body dressing/undressing activity did not occur:  (no clothes on eval) What is the patient wearing?: Pull over shirt/dress Pull over shirt/dress - Perfomed by patient: Thread/unthread right sleeve, Thread/unthread left sleeve, Put head through opening, Pull shirt over trunk Assist Level: Supervision or verbal cues Function - Lower Body Dressing/Undressing What is the patient wearing?: Underwear, Pants, Socks, Shoes Position: Sitting EOB Underwear - Performed by patient: Thread/unthread right underwear leg, Thread/unthread left underwear leg, Pull  underwear up/down Pants- Performed by patient: Thread/unthread right pants leg, Thread/unthread left pants leg, Pull pants up/down, Fasten/unfasten pants Non-skid slipper socks- Performed by patient: Don/doff right sock, Don/doff left sock Socks - Performed by patient: Don/doff right sock, Don/doff left sock Shoes - Performed by patient: Fasten left, Fasten right, Don/doff left shoe, Don/doff right shoe TED Hose - Performed by patient: Don/doff right TED hose, Don/doff left TED hose Assist for footwear: Setup Assist for lower body dressing: Set up  Function - Toileting Toileting steps completed by patient: Adjust clothing prior to toileting, Performs perineal hygiene, Adjust clothing after toileting Toileting Assistive Devices: Grab bar or rail Assist level: More than reasonable time  Function Midwife transfer assistive device: Grab bar Assist level to toilet: No Help, no cues, assistive device, takes more than a reasonable amount of time Assist level from toilet: No Help, no cues, assistive device, takes more than a reasonable amount of time  Function - Chair/bed transfer Chair/bed transfer assist level: Supervision or verbal cues Chair/bed transfer details: Verbal cues for technique, Verbal cues  for sequencing  Function - Locomotion: Wheelchair Will patient use wheelchair at discharge?: No Function - Locomotion: Ambulation Assistive device: No device Max distance: 225 Assist level: Supervision or verbal cues Assist level: Supervision or verbal cues Assist level: Supervision or verbal cues Assist level: Supervision or verbal cues Assist level: Supervision or verbal cues  Function - Comprehension Comprehension: Auditory Comprehension assist level: Follows complex conversation/direction with no assist  Function - Expression Expression: Verbal Expression assist level: Expresses complex ideas: With no assist  Function - Social Interaction Social Interaction  assist level: Interacts appropriately with others - No medications needed.  Function - Problem Solving Problem solving assist level: Solves complex problems: Recognizes & self-corrects  Function - Memory Memory assist level: Complete Independence: No helper Patient normally able to recall (first 3 days only): Current season, Location of own room, Staff names and faces, That he or she is in a hospital   Medical Problem List and Plan: 1.  Debilitation secondary to C. Difficile/MSSA and abscess associated with LVAD. Status post irrigation debridement of chest sternal wound 09/09/2015- Cont therapies 2.  DVT Prophylaxis/Anticoagulation: Coumadin therapy for history of atrial fibrillation. Monitor for any bleeding episodes, pharmacy protocol 3. Pain Management: Lyrica 75 mg daily,Ultram '50mg'$  every 6 hour, Oxycodone as needed 4. Mood: Celexa 20 mg daily 5. Neuropsych: This patient is capable of making decisions on his own behalf. 6. Skin/Wound Care: Routine skin checks 7. Fluids/Electrolytes/Nutrition:  I personally reviewed the patient's labs today.  8. ID. LVAD driveline infection/MSSA. Presently day 32/42 Cefazolin and rifampin.Follow up with infectious disease Dr. Linus Salmons 9. Clostridium difficile. Twice a day vancomycin through 10/08/2015. 10. AKI. Follow-up renal services.No pending plan for dialysis at this time. Follow-up chemistries are stable 11. Hypertension. Hydralazine 25 mg every 8 hours, Revatio 20 mg 3 times a day 12. Ileus. Resolved. Diet advance. Nausea felt to be related to antibiotics. Continue Reglan for now 13. Atrial fibrillation. Cardiac rate controlled. Continue Coumadin 14. Acute on chronic anemia. Follow-up CBC hemoglobin 8.1-9.2, 8.0 today 15.Gout.Monitor for flare up. Continue Zyloprim  LOS (Days) 3 A FACE TO FACE EVALUATION WAS PERFORMED  Shenequa Howse T 09/28/2015, 9:00 AM

## 2015-09-28 NOTE — Progress Notes (Signed)
[place] for Coumadin Indication: LVAD   Allergies  Allergen Reactions  . Phytonadione Other (See Comments)    Patient has LVAD: please check with LVAD coordinator on call or LVAD MD on call before reversal of anticoagulation with vit k    Patient Measurements: Weight: 172 lb 9.9 oz (78.3 kg) Heparin Dosing Weight: n/a  Vital Signs:    Labs:  Recent Labs  09/26/15 0520 09/27/15 0355 09/27/15 1100 09/28/15 0600  HGB 8.0*  --  8.9* 8.0*  HCT 25.4*  --  28.0* 25.5*  PLT 208  --  232 187  LABPROT 25.2* 27.3*  --  32.5*  INR 2.31* 2.58*  --  3.24*  CREATININE 6.78* 6.70*  --  6.56*    Estimated Creatinine Clearance: 13 mL/min (by C-G formula based on Cr of 6.56).   Medications:  Scheduled:  . allopurinol  100 mg Oral Daily  .  ceFAZolin (ANCEF) IV  2 g Intravenous Q24H  . citalopram  20 mg Oral Daily  . docusate sodium  200 mg Oral Daily  . feeding supplement (NEPRO CARB STEADY)  237 mL Oral BID BM  . fluconazole (DIFLUCAN) IV  100 mg Intravenous Q24H  . furosemide  80 mg Oral BID  . hydrALAZINE  25 mg Oral Q8H  . metoCLOPramide  5 mg Oral Q6H  . pregabalin  75 mg Oral Daily  . rifampin  300 mg Oral Q12H  . sildenafil  20 mg Oral Q8H  . simethicone  80 mg Oral QID  . tamsulosin  0.4 mg Oral Daily  . vancomycin  125 mg Oral Q12H  . Warfarin - Pharmacist Dosing Inpatient   Does not apply q1800    Assessment: 45 yo male with LVAD on chronic Coumadin.  Pharmacy now dosing at [facility] request.  Patient receiving ancef and rifampin for driveline infection and MSSA bacteremia.  Also on fluconazole for yeast in urine. Anticipate difficulty keeping INR in range given drug interactions with both rifampin and fluconazole.    Large increase in INR from 2.58 to 3.24 despite lower dose of 2 mg yesterday. Last dose of Fluconazole today. Watching platelets closely as trending down. No bleeding reported.   Goal of Therapy:  INR  2-2.5 Monitor platelets by anticoagulation protocol: Yes   Plan:  1. Hold Coumadin tonight due to large elevation in INR 2. F/u daily INR   Sloan Leiter, PharmD, BCPS Clinical Pharmacist [phone] 09/28/2015 9:03 AM

## 2015-09-28 NOTE — Progress Notes (Signed)
S: No new CO O:Pulse 76  Temp(Src) 97.7 F (36.5 C) (Oral)  Resp 18  Wt 78.3 kg (172 lb 9.9 oz)  SpO2 100%  Intake/Output Summary (Last 24 hours) at 09/28/15 1036 Last data filed at 09/28/15 0733  Gross per 24 hour  Intake    760 ml  Output   3350 ml  Net  -2590 ml   Weight change:  [id number] and alert CVS: RRR Resp:  Bibasilar crackles Abd: Bandage over LVAD, + BS NTND Ext: 0-tr edema NEURO: CNI Ox3 no asterixis RT IJ permcath Rt PICC   . allopurinol  100 mg Oral Daily  .  ceFAZolin (ANCEF) IV  2 g Intravenous Q24H  . citalopram  20 mg Oral Daily  . docusate sodium  200 mg Oral Daily  . feeding supplement (NEPRO CARB STEADY)  237 mL Oral BID BM  . fluconazole (DIFLUCAN) IV  100 mg Intravenous Q24H  . furosemide  80 mg Oral BID  . hydrALAZINE  25 mg Oral Q8H  . metoCLOPramide  5 mg Oral Q6H  . pregabalin  75 mg Oral Daily  . rifampin  300 mg Oral Q12H  . sildenafil  20 mg Oral Q8H  . simethicone  80 mg Oral QID  . tamsulosin  0.4 mg Oral Daily  . vancomycin  125 mg Oral Q12H  . Warfarin - Pharmacist Dosing Inpatient   Does not apply q1800   No results found. BMET    Component Value Date/Time   NA 140 09/28/2015 0600   K 4.1 09/28/2015 0600   CL 102 09/28/2015 0600   CO2 25 09/28/2015 0600   GLUCOSE 86 09/28/2015 0600   BUN 56* 09/28/2015 0600   CREATININE 6.56* 09/28/2015 0600   CREATININE 0.92 02/01/2015 1132   CALCIUM 8.9 09/28/2015 0600   GFRNONAA 9* 09/28/2015 0600   GFRAA 10* 09/28/2015 0600   CBC    Component Value Date/Time   WBC 9.2 09/28/2015 0600   RBC 3.10* 09/28/2015 0600   HGB 8.0* 09/28/2015 0600   HCT 25.5* 09/28/2015 0600   PLT 187 09/28/2015 0600   MCV 82.3 09/28/2015 0600   MCH 25.8* 09/28/2015 0600   MCHC 31.4 09/28/2015 0600   RDW 18.8* 09/28/2015 0600   LYMPHSABS 1.5 09/28/2015 0600   MONOABS 0.7 09/28/2015 0600   EOSABS 0.2 09/28/2015 0600   BASOSABS 0.1 09/28/2015 0600     Assessment: 1. ARF, nonoliguric.  Scr  fractionally lower again this AM  UO excellent 2. LVAD abscess, MSSA  On ancef and rifampin 3. Anemia 4. Severe CHF 5. C diff on po vanco Plan: 1.  Recheck labs in AM 2. Cont po lasix  Kellin Bartling T

## 2015-09-28 NOTE — Progress Notes (Addendum)
HeartMate 2 Rounding Note  Subjective:     Transferred to CIR 6/7.   Creatinine down slightly 6.7-> 6.5  Almost 3 L of urine out.   Feeling good. Working with PT.  Hgb down slightly.  INR 3.2 MAPs 90s  VAD INTERROGATION:  HeartMate II LVAD: Flow 4.9 liters/min, speed 9200, power 5.2 PI 7.6.  .   Objective:    Vital Signs:   Temp:  [97.7 F (36.5 C)] 97.7 F (36.5 C) (06/09 1544) SpO2:  [100 %] 100 % (06/09 1544) Last BM Date: 09/26/15 Mean arterial Pressure 90-100  Intake/Output:   Intake/Output Summary (Last 24 hours) at 09/28/15 0823 Last data filed at 09/28/15 0733  Gross per 24 hour  Intake    520 ml  Output   4350 ml  Net  -3830 ml    Physical Exam: General: In bed. NAD HEENT: normal Neck: supple. JVP 6 cm. Carotids 2+ bilat; no bruits. No thyeomegaly or nodule noted.  R subclavian trialysis cath dressing dry Cor: Mechanical heart sounds with LVAD hum present. Lungs: CTAB, normal effort.  Abdomen: soft, some tenderness, ND, no HSM. No bruits or masses. +BS  Driveline: C/D/I; securement device intact and driveline incorporated Extremities: no cyanosis, clubbing, rash, trace edema  Neuro: A & O x3  Telemetry: NSR  Labs: Basic Metabolic Panel:  Recent Labs Lab 09/23/15 0440 09/24/15 0455 09/25/15 0515 09/26/15 0520 09/27/15 0355 09/28/15 0600  NA 140 141 139 140 141 140  K 4.5 5.1 5.1 4.5 5.0 4.1  CL 106 106 106 109 107 102  CO2 22 21* 20* 20* 21* 25  GLUCOSE 80 76 74 84 73 86  BUN 55* 59* 58* 59* 58* 56*  CREATININE 6.29* 6.50* 6.63* 6.78* 6.70* 6.56*  CALCIUM 8.4* 8.8* 8.7* 8.6* 8.7* 8.9  PHOS 6.3* 6.5* 6.7*  --  6.8* 7.4*    Liver Function Tests:  Recent Labs Lab 09/22/15 0415 09/23/15 0440 09/24/15 0455 09/25/15 0515 09/26/15 0520 09/27/15 0355 09/28/15 0600  AST 93* 75*  --   --  74*  --   --   ALT 15* 11*  --   --  9*  --   --   ALKPHOS 222* 226*  --   --  265*  --   --   BILITOT 5.5* 5.0*  --   --  4.3*  --   --    PROT 6.8 7.0  --   --  6.7  --   --   ALBUMIN 1.7* 1.8* 1.7* 1.7* 1.8* 1.9* 1.8*   No results for input(s): LIPASE, AMYLASE in the last 168 hours. No results for input(s): AMMONIA in the last 168 hours.  CBC:  Recent Labs Lab 09/25/15 0900 09/25/15 1529 09/26/15 0520 09/27/15 1100 09/28/15 0600  WBC 10.7* 11.2* 9.6 11.2* 9.2  NEUTROABS  --   --  7.4  --  6.7  HGB 7.9* 8.8* 8.0* 8.9* 8.0*  HCT 24.9* 27.6* 25.4* 28.0* 25.5*  MCV 82.5 83.1 80.9 81.9 82.3  PLT 187 225 208 232 187    INR:  Recent Labs Lab 09/24/15 0455 09/25/15 0515 09/26/15 0520 09/27/15 0355 09/28/15 0600  INR 3.18* 2.33* 2.31* 2.58* 3.24*    Other results:    Imaging: No results found.   Medications:     Scheduled Medications: . allopurinol  100 mg Oral Daily  .  ceFAZolin (ANCEF) IV  2 g Intravenous Q24H  . citalopram  20 mg Oral Daily  .  docusate sodium  200 mg Oral Daily  . feeding supplement (NEPRO CARB STEADY)  237 mL Oral BID BM  . fluconazole (DIFLUCAN) IV  100 mg Intravenous Q24H  . furosemide  80 mg Oral BID  . hydrALAZINE  25 mg Oral Q8H  . metoCLOPramide  5 mg Oral Q6H  . pregabalin  75 mg Oral Daily  . rifampin  300 mg Oral Q12H  . sildenafil  20 mg Oral Q8H  . simethicone  80 mg Oral QID  . tamsulosin  0.4 mg Oral Daily  . vancomycin  125 mg Oral Q12H  . Warfarin - Pharmacist Dosing Inpatient   Does not apply q1800    Infusions:    PRN Medications: acetaminophen, Gerhardt's butt cream, guaiFENesin, ondansetron **OR** ondansetron (ZOFRAN) IV, oxyCODONE, sodium chloride flush, sorbitol, traMADol   Assessment:    1. LVAD Complication- Driveline abscess 2. MSSA bacteremia -> septic shock 3. Chronic systolic HF s/p VAD placement 9/16 4. C. Difficile colitis 5. 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 Acute blood loss anemia 8. Severe Malnutrition.  9. AKI - currently requiring HD.  10. Klebsiella (ESBL) pneumonia 11.  Funguria   Plan/Discussion:    Making progress. Urine output great. Creatinine starting to improve.   Remains on cefazolin + rifampin for MSSA bacteremia and oral vanc for C difficile. Klebseilla PNA has been treated. PO intake has improved. Continue Ancef, oral Vanc, and Rifampin through June 20th. Finishes Fluconazole today   Maps better but still on high side. Continue hydralazine 25 mg every 8 hrs. Letting BP run a little to help renal recovery. Increase hydralazine as needed.  Weight and volume status stable.   Dr. Prescott Gum following wound.   VAD parameters ok.  Appreciate CIR.   I reviewed the LVAD parameters from today, and compared the results to the patient's prior recorded data.  No programming changes were made.  The LVAD is functioning within specified parameters.  The patient performs LVAD self-test daily.  LVAD interrogation was negative for any significant power changes, alarms or PI events/speed drops.  LVAD equipment check completed and is in good working order.  Back-up equipment present.   LVAD education done on emergency procedures and precautions and reviewed exit site care.  Length of Stay: 3  Glori Bickers, MD  09/28/2015, 8:23 AM  VAD Team --- VAD ISSUES ONLY--- Pager [phone] (7am - 7am)  Advanced Heart Failure Team  Pager [phone] (M-F; 7a - 4p)  Please contact [facility] for night-coverage after hours (4p -7a ) and weekends on [email or web address]

## 2015-09-28 NOTE — Progress Notes (Signed)
Occupational Therapy Session Note  Patient Details  Name: [name] MRN: [id number] Date of Birth: [birth year]  Today's Date: 09/28/2015 OT Individual Time: 1045-1200 OT Individual Time Calculation (min): 75 min    Short Term Goals: Week 1:  OT Short Term Goal 1 (Week 1): STG = LTGS due to short ELOS  Skilled Therapeutic Interventions/Progress Updates:    1:1 Pt reported completed bathing and dressing mod I today. Therapeutic activity to address balance and activity tolerance. Pt participated in balance activities on the Balance Master/ [employer]. Focus on weight shifts for following the maze and circle control with static base and movable base at level 12 (easiest). Pt able to complete tasks with out UE support with static base but with dynamic base required minimal to moderate Ue support. Pt able to complete all tasks with supervision with VC for feet placement. Then progress to games on the Wii for dynamic standing balance and cardiovascular endurance. Pt required 2 seated rest breaks.   2nd session no c/o pain  13:30-14:25  1:1 Sister present.  Continued focus on activity tolerance and cardiovascular endurance. Pt ambulated from room outside, sat for rest break, ambulated back through the hospital the long way to return back to his room at a slower pace but with distant supervision. Provided education from the [employer] on daily  Activities that could count for daily exercise to continue to build up endurance. Home safety recommendation and recommendation for return to community outings provided related to endurance.  Therapy Documentation Precautions:  Precautions Precautions: Fall Precaution Comments: LVAD; Attach Controller to [facility] (electric) PBU power during sleep or bedrest. Attach Controller to battery power during transport or ambulation. Do NOT transport on console. Secure Driveline/Controller at all times including ambulation and sleep with an attachment device.  Complete safety checklist at beginning of each shift. Emergency travel pack to be with patient at all times. Extra batteries are charging in [place] at all times Restrictions Weight Bearing Restrictions: No Pain: No c/o pain  See Function Navigator for Current Functional Status.   Therapy/Group: Individual Therapy  [name] [facility] 09/28/2015, 12:26 PM

## 2015-09-28 NOTE — Progress Notes (Signed)
Physical Therapy Session Note  Patient Details  Name: [name] MRN: [id number] Date of Birth: 1971-03-05  Today's Date: 09/28/2015 PT Individual Time: [id number] PT Individual Time Calculation (min): 58 min   Short Term Goals: Week 1:  PT Short Term Goal 1 (Week 1): STGs = LTGs  Skilled Therapeutic Interventions/Progress Updates:   Pt received in chair with RN present to administer pain medication.  Pt already set up on battery and ready to go to gym; requested PT carry bag due to heaviness of bag.  Performed gait in controlled environment with supervision x 150'.  Pt reporting some gastroc soreness and tightness from exercises yesterday.  Pt performed bilat closed chain gastroc stretches standing with feet wedged into DF x 30 seconds x 2 reps.  Provided pt with handout, reviewed and had pt return demonstrate OTAGO LE strengthening exercises in standing on foam with UE support and OTAGO dynamic standing balance exercises in hallway with rail for support and close supervision.  Added forwards and lateral step ups onto foam to focus on balance and stability during single leg step ups.  Discussed safety with pt and how to progress each exercise or reduce intensity of exercise as needed.  Pt given intermittent sitting rest breaks but pt able to maintain standing x 10-12 seconds at a time.  Returned to room and performed cognitive dual tasks during gait with supervision and no episodes of LOB.  Pt left on sofa to rest and wait on OT still connect to battery power with all items within reach.    Therapy Documentation Precautions:  Precautions Precautions: Fall Precaution Comments: LVAD; Attach Controller to [facility] (electric) PBU power during sleep or bedrest. Attach Controller to battery power during transport or ambulation. Do NOT transport on console. Secure Driveline/Controller at all times including ambulation and sleep with an attachment device. Complete safety checklist at beginning of each  shift. Emergency travel pack to be with patient at all times. Extra batteries are charging in [place] at all times Restrictions Weight Bearing Restrictions: No Pain: Pain Assessment Pain Score: 6  Pain Type: Acute pain Pain Location: Abdomen Pain Orientation: Right Pain Descriptors / Indicators: Aching Pain Onset: On-going Pain Intervention(s): Other (Comment) (premedicated)  See Function Navigator for Current Functional Status.   Therapy/Group: Individual Therapy  Raylene Everts [facility] 09/28/2015, 10:16 AM

## 2015-09-29 LAB — CBC WITH DIFFERENTIAL/PLATELET
BASOS ABS: 0 10*3/uL (ref 0.0–0.1)
BASOS PCT: 0 %
Eosinophils Absolute: 0.2 10*3/uL (ref 0.0–0.7)
Eosinophils Relative: 2 %
HEMATOCRIT: 26.3 % — AB (ref 39.0–52.0)
HEMOGLOBIN: 8.2 g/dL — AB (ref 13.0–17.0)
Lymphocytes Relative: 15 %
Lymphs Abs: 1.6 10*3/uL (ref 0.7–4.0)
MCH: 25.5 pg — ABNORMAL LOW (ref 26.0–34.0)
MCHC: 31.2 g/dL (ref 30.0–36.0)
MCV: 81.9 fL (ref 78.0–100.0)
Monocytes Absolute: 0.7 10*3/uL (ref 0.1–1.0)
Monocytes Relative: 7 %
NEUTROS ABS: 7.8 10*3/uL — AB (ref 1.7–7.7)
NEUTROS PCT: 75 %
Platelets: 181 10*3/uL (ref 150–400)
RBC: 3.21 MIL/uL — AB (ref 4.22–5.81)
RDW: 18.4 % — ABNORMAL HIGH (ref 11.5–15.5)
WBC: 10.3 10*3/uL (ref 4.0–10.5)

## 2015-09-29 LAB — RENAL FUNCTION PANEL
ALBUMIN: 2 g/dL — AB (ref 3.5–5.0)
ANION GAP: 12 (ref 5–15)
BUN: 55 mg/dL — ABNORMAL HIGH (ref 6–20)
CALCIUM: 8.8 mg/dL — AB (ref 8.9–10.3)
CO2: 25 mmol/L (ref 22–32)
Chloride: 101 mmol/L (ref 101–111)
Creatinine, Ser: 6.64 mg/dL — ABNORMAL HIGH (ref 0.61–1.24)
GFR, EST AFRICAN AMERICAN: 10 mL/min — AB (ref >60)
GFR, EST NON AFRICAN AMERICAN: 9 mL/min — AB (ref >60)
Glucose, Bld: 97 mg/dL (ref 65–99)
PHOSPHORUS: 7.1 mg/dL — AB (ref 2.5–4.6)
Potassium: 3.9 mmol/L (ref 3.5–5.1)
Sodium: 138 mmol/L (ref 135–145)

## 2015-09-29 LAB — PROTIME-INR
INR: 3.16 — ABNORMAL HIGH (ref 0.00–1.49)
Prothrombin Time: 31.8 seconds — ABNORMAL HIGH (ref 11.6–15.2)

## 2015-09-29 MED ORDER — FUROSEMIDE 80 MG PO TABS
80.0000 mg | ORAL_TABLET | Freq: Every day | ORAL | Status: DC
  Administered 2015-09-30 – 2015-10-02 (×3): 80 mg via ORAL
  Filled 2015-09-29 (×3): qty 1

## 2015-09-29 MED ORDER — WARFARIN 0.5 MG HALF TABLET
0.5000 mg | ORAL_TABLET | Freq: Once | ORAL | Status: AC
  Administered 2015-09-29: 0.5 mg via ORAL
  Filled 2015-09-29: qty 1

## 2015-09-29 NOTE — Progress Notes (Signed)
Subjective/Complaints:  Up in bathroom. No new complaints. Feels well.    ROS- negative for chest pain, shortness of breath, nausea, vomiting, diarrhea,, constipation  Objective: Vital Signs: Pulse 103, temperature 98.2 F (36.8 C), temperature source Oral, resp. rate 17, weight 78.3 kg (172 lb 9.9 oz), SpO2 100 %. No results found. Results for orders placed or performed during the hospital encounter of 09/25/15 (from the past 72 hour(s))  Glucose, capillary     Status: None   Collection Time: 09/26/15 11:54 AM  Result Value Ref Range   Glucose-Capillary 88 65 - 99 mg/dL  Protime-INR     Status: Abnormal   Collection Time: 09/27/15  3:55 AM  Result Value Ref Range   Prothrombin Time 27.3 (H) 11.6 - 15.2 seconds   INR 2.58 (H) 0.00 - 1.49  Renal function panel     Status: Abnormal   Collection Time: 09/27/15  3:55 AM  Result Value Ref Range   Sodium 141 135 - 145 mmol/L   Potassium 5.0 3.5 - 5.1 mmol/L   Chloride 107 101 - 111 mmol/L   CO2 21 (L) 22 - 32 mmol/L   Glucose, Bld 73 65 - 99 mg/dL   BUN 58 (H) 6 - 20 mg/dL   Creatinine, Ser 6.70 (H) 0.61 - 1.24 mg/dL   Calcium 8.7 (L) 8.9 - 10.3 mg/dL   Phosphorus 6.8 (H) 2.5 - 4.6 mg/dL   Albumin 1.9 (L) 3.5 - 5.0 g/dL   GFR calc non Af Amer 9 (L) >60 mL/min   GFR calc Af Amer 10 (L) >60 mL/min    Comment: (NOTE) The eGFR has been calculated using the CKD EPI equation. This calculation has not been validated in all clinical situations. eGFR's persistently <60 mL/min signify possible Chronic Kidney Disease.    Anion gap 13 5 - 15  Glucose, capillary     Status: None   Collection Time: 09/27/15  6:48 AM  Result Value Ref Range   Glucose-Capillary 83 65 - 99 mg/dL  CBC     Status: Abnormal   Collection Time: 09/27/15 11:00 AM  Result Value Ref Range   WBC 11.2 (H) 4.0 - 10.5 K/uL   RBC 3.42 (L) 4.22 - 5.81 MIL/uL   Hemoglobin 8.9 (L) 13.0 - 17.0 g/dL   HCT 28.0 (L) 39.0 - 52.0 %   MCV 81.9 78.0 - 100.0 fL   MCH 26.0  26.0 - 34.0 pg   MCHC 31.8 30.0 - 36.0 g/dL   RDW 19.0 (H) 11.5 - 15.5 %   Platelets 232 150 - 400 K/uL  Protime-INR     Status: Abnormal   Collection Time: 09/28/15  6:00 AM  Result Value Ref Range   Prothrombin Time 32.5 (H) 11.6 - 15.2 seconds   INR 3.24 (H) 0.00 - 1.49  CBC with Differential/Platelet     Status: Abnormal   Collection Time: 09/28/15  6:00 AM  Result Value Ref Range   WBC 9.2 4.0 - 10.5 K/uL   RBC 3.10 (L) 4.22 - 5.81 MIL/uL   Hemoglobin 8.0 (L) 13.0 - 17.0 g/dL   HCT 25.5 (L) 39.0 - 52.0 %   MCV 82.3 78.0 - 100.0 fL   MCH 25.8 (L) 26.0 - 34.0 pg   MCHC 31.4 30.0 - 36.0 g/dL   RDW 18.8 (H) 11.5 - 15.5 %   Platelets 187 150 - 400 K/uL    Comment: PLATELET COUNT CONFIRMED BY SMEAR   Neutrophils Relative % 73 %  Neutro Abs 6.7 1.7 - 7.7 K/uL   Lymphocytes Relative 16 %   Lymphs Abs 1.5 0.7 - 4.0 K/uL   Monocytes Relative 8 %   Monocytes Absolute 0.7 0.1 - 1.0 K/uL   Eosinophils Relative 3 %   Eosinophils Absolute 0.2 0.0 - 0.7 K/uL   Basophils Relative 1 %   Basophils Absolute 0.1 0.0 - 0.1 K/uL  Renal function panel     Status: Abnormal   Collection Time: 09/28/15  6:00 AM  Result Value Ref Range   Sodium 140 135 - 145 mmol/L   Potassium 4.1 3.5 - 5.1 mmol/L    Comment: DELTA CHECK NOTED   Chloride 102 101 - 111 mmol/L   CO2 25 22 - 32 mmol/L   Glucose, Bld 86 65 - 99 mg/dL   BUN 56 (H) 6 - 20 mg/dL   Creatinine, Ser 6.56 (H) 0.61 - 1.24 mg/dL   Calcium 8.9 8.9 - 10.3 mg/dL   Phosphorus 7.4 (H) 2.5 - 4.6 mg/dL   Albumin 1.8 (L) 3.5 - 5.0 g/dL   GFR calc non Af Amer 9 (L) >60 mL/min   GFR calc Af Amer 10 (L) >60 mL/min    Comment: (NOTE) The eGFR has been calculated using the CKD EPI equation. This calculation has not been validated in all clinical situations. eGFR's persistently <60 mL/min signify possible Chronic Kidney Disease.    Anion gap 13 5 - 15  Protime-INR     Status: Abnormal   Collection Time: 09/29/15  4:25 AM  Result Value Ref  Range   Prothrombin Time 31.8 (H) 11.6 - 15.2 seconds   INR 3.16 (H) 0.00 - 1.49  Renal function panel     Status: Abnormal   Collection Time: 09/29/15  4:25 AM  Result Value Ref Range   Sodium 138 135 - 145 mmol/L   Potassium 3.9 3.5 - 5.1 mmol/L   Chloride 101 101 - 111 mmol/L   CO2 25 22 - 32 mmol/L   Glucose, Bld 97 65 - 99 mg/dL   BUN 55 (H) 6 - 20 mg/dL   Creatinine, Ser 6.64 (H) 0.61 - 1.24 mg/dL   Calcium 8.8 (L) 8.9 - 10.3 mg/dL   Phosphorus 7.1 (H) 2.5 - 4.6 mg/dL   Albumin 2.0 (L) 3.5 - 5.0 g/dL   GFR calc non Af Amer 9 (L) >60 mL/min   GFR calc Af Amer 10 (L) >60 mL/min    Comment: (NOTE) The eGFR has been calculated using the CKD EPI equation. This calculation has not been validated in all clinical situations. eGFR's persistently <60 mL/min signify possible Chronic Kidney Disease.    Anion gap 12 5 - 15  CBC with Differential/Platelet     Status: Abnormal   Collection Time: 09/29/15  4:25 AM  Result Value Ref Range   WBC 10.3 4.0 - 10.5 K/uL   RBC 3.21 (L) 4.22 - 5.81 MIL/uL   Hemoglobin 8.2 (L) 13.0 - 17.0 g/dL   HCT 26.3 (L) 39.0 - 52.0 %   MCV 81.9 78.0 - 100.0 fL   MCH 25.5 (L) 26.0 - 34.0 pg   MCHC 31.2 30.0 - 36.0 g/dL   RDW 18.4 (H) 11.5 - 15.5 %   Platelets 181 150 - 400 K/uL    Comment: REPEATED TO VERIFY PLATELET COUNT CONFIRMED BY SMEAR    Neutrophils Relative % 75 %   Neutro Abs 7.8 (H) 1.7 - 7.7 K/uL   Lymphocytes Relative 15 %   Lymphs  Abs 1.6 0.7 - 4.0 K/uL   Monocytes Relative 7 %   Monocytes Absolute 0.7 0.1 - 1.0 K/uL   Eosinophils Relative 2 %   Eosinophils Absolute 0.2 0.0 - 0.7 K/uL   Basophils Relative 0 %   Basophils Absolute 0.0 0.0 - 0.1 K/uL     HEENT: normal Cardio: RRR and LVAD hum Resp: CTA B/L and unlabored GI: BS positive and i'll distention nontender Extremity:  Pulses positive and No Edema Skin:   Intact and Other LVAD wire site remains clean and dry Neuro: Alert/Oriented, Normal Sensory and Abnormal Motor 4/5  bilateral deltoid, biceps, triceps, grip, hip flexor, knee extensor, ankle dorsal flexor plantar flexor Musc/Skel:  Normal and Other no pain with upper extremity lower extremity range of motion  wasn'[name]  cervical spine Gen. no acute distress   Assessment/Plan: 1. Functional deficits secondary to Debility which require 3+ hours per day of interdisciplinary therapy in a comprehensive inpatient rehab setting. Physiatrist is providing close team supervision and 24 hour management of active medical problems listed below. Physiatrist and rehab team continue to assess barriers to discharge/monitor patient progress toward functional and medical goals. FIM: Function - Bathing Bathing activity did not occur:  (pt reports he completed prior to session) Position: Sitting EOB (standing for majority of bathing, seated EOB to wash lower legs and feet per pt report) Body parts bathed by patient: Right arm, Left arm, Chest, Abdomen, Front perineal area, Buttocks, Right upper leg, Left upper leg, Right lower leg, Left lower leg, Back Assist Level: Set up Set up : To obtain items  Function- Upper Body Dressing/Undressing Upper body dressing/undressing activity did not occur:  (no clothes on eval) What is the patient wearing?: Pull over shirt/dress Pull over shirt/dress - Perfomed by patient: Thread/unthread right sleeve, Thread/unthread left sleeve, Put head through opening, Pull shirt over trunk Assist Level: Supervision or verbal cues Function - Lower Body Dressing/Undressing What is the patient wearing?: Underwear, Pants, Socks, Shoes Position: Sitting EOB Underwear - Performed by patient: Thread/unthread right underwear leg, Thread/unthread left underwear leg, Pull underwear up/down Pants- Performed by patient: Thread/unthread right pants leg, Thread/unthread left pants leg, Pull pants up/down, Fasten/unfasten pants Non-skid slipper socks- Performed by patient: Don/doff right sock, Don/doff left  sock Socks - Performed by patient: Don/doff right sock, Don/doff left sock Shoes - Performed by patient: Fasten left, Fasten right, Don/doff left shoe, Don/doff right shoe TED Hose - Performed by patient: Don/doff right TED hose, Don/doff left TED hose Assist for footwear: Setup Assist for lower body dressing: Set up  Function - Toileting Toileting steps completed by patient: Adjust clothing prior to toileting, Performs perineal hygiene, Adjust clothing after toileting Toileting Assistive Devices: Grab bar or rail Assist level: More than reasonable time  Function - Air cabin crew transfer assistive device: Grab bar Assist level to toilet: No Help, no cues, assistive device, takes more than a reasonable amount of time Assist level from toilet: No Help, no cues, assistive device, takes more than a reasonable amount of time  Function - Chair/bed transfer Chair/bed transfer assist level: Supervision or verbal cues Chair/bed transfer assistive device: Armrests Chair/bed transfer details: Verbal cues for technique, Verbal cues for sequencing  Function - Locomotion: Wheelchair Will patient use wheelchair at discharge?: No Function - Locomotion: Ambulation Assistive device: No device Max distance: 225 Assist level: Supervision or verbal cues Assist level: Supervision or verbal cues Assist level: Supervision or verbal cues Assist level: Supervision or verbal cues Assist level: Supervision or verbal  cues  Function - Comprehension Comprehension: Auditory Comprehension assist level: Follows complex conversation/direction with no assist  Function - Expression Expression: Verbal Expression assist level: Expresses complex ideas: With no assist  Function - Social Interaction Social Interaction assist level: Interacts appropriately with others - No medications needed.  Function - Problem Solving Problem solving assist level: Solves complex problems: Recognizes &  self-corrects  Function - Memory Memory assist level: Complete Independence: No helper Patient normally able to recall (first 3 days only): Current season, Location of own room, Staff names and faces, That he or she is in a hospital   Medical Problem List and Plan: 1.  Debilitation secondary to C. Difficile/MSSA and abscess associated with LVAD. Status post irrigation debridement of chest sternal wound 09/09/2015- Cont therapies  -moving well 2.  DVT Prophylaxis/Anticoagulation: Coumadin therapy for history of atrial fibrillation. Monitor for any bleeding episodes, pharmacy protocol 3. Pain Management: Lyrica 75 mg daily,Ultram '50mg'$  every 6 hour, Oxycodone as needed 4. Mood: Celexa 20 mg daily 5. Neuropsych: This patient is capable of making decisions on his own behalf. 6. Skin/Wound Care: Routine skin checks 7. Fluids/Electrolytes/Nutrition:  I personally reviewed the patient's labs today.  8. ID. LVAD driveline infection/MSSA. Presently day 34/42 Cefazolin and rifampin.Follow up with infectious disease Dr. Linus Salmons 9. Clostridium difficile. Twice a day vancomycin through 10/08/2015. 10. AKI. Follow-up renal services.No pending plan for dialysis at this time. Follow-up chemistries are stable 11. Hypertension. Hydralazine 25 mg every 8 hours, Revatio 20 mg 3 times a day 12. Ileus. Resolved. Diet advance. Nausea felt to be related to antibiotics. Continue Reglan for now 13. Atrial fibrillation. Cardiac rate controlled. Continue Coumadin 14. Acute on chronic anemia. Follow-up CBC hemoglobin 8.1-9.2, 8.2 today 15.Gout.Monitor for flare up. Continue Zyloprim  LOS (Days) 4 A FACE TO FACE EVALUATION WAS PERFORMED  SWARTZ,[name] 09/29/2015, 8:54 AM

## 2015-09-29 NOTE — Progress Notes (Signed)
HeartMate 2 Rounding Note  Subjective:     Transferred to CIR 6/7.   Creatinine still up 6.7-> 6.5. Still with good urine output  Feeling good. Working with PT.  Hgb down slightly.  INR 3.2 MAPs 901-00  VAD INTERROGATION:  HeartMate II LVAD: Flow 4.1 liters/min, speed 9200, power 5.0  PI 7.7.  .   Objective:    Vital Signs:   Temp:  [98.2 F (36.8 C)] 98.2 F (36.8 C) (06/10 1500) Pulse Rate:  [103] 103 (06/10 1500) Resp:  [17] 17 (06/10 1500) SpO2:  [100 %] 100 % (06/10 1500) Last BM Date: 09/28/15 Mean arterial Pressure 90-100  Intake/Output:   Intake/Output Summary (Last 24 hours) at 09/29/15 0810 Last data filed at 09/29/15 0726  Gross per 24 hour  Intake    240 ml  Output   2600 ml  Net  -2360 ml    Physical Exam: General: In bed. NAD HEENT: normal Neck: supple. JVP flat Carotids 2+ bilat; no bruits. No thyeomegaly or nodule noted.  R subclavian trialysis cath dressing dry Cor: Mechanical heart sounds with LVAD hum present. Lungs: CTAB, normal effort.  Abdomen: soft, some tenderness, ND, no HSM. No bruits or masses. +BS  Driveline: C/D/I; securement device intact and driveline incorporated Extremities: no cyanosis, clubbing, rash, trace edema  Neuro: A & O x3  Telemetry: NSR  Labs: Basic Metabolic Panel:  Recent Labs Lab 09/24/15 0455 09/25/15 0515 09/26/15 0520 09/27/15 0355 09/28/15 0600 09/29/15 0425  NA 141 139 140 141 140 138  K 5.1 5.1 4.5 5.0 4.1 3.9  CL 106 106 109 107 102 101  CO2 21* 20* 20* 21* 25 25  GLUCOSE 76 74 84 73 86 97  BUN 59* 58* 59* 58* 56* 55*  CREATININE 6.50* 6.63* 6.78* 6.70* 6.56* 6.64*  CALCIUM 8.8* 8.7* 8.6* 8.7* 8.9 8.8*  PHOS 6.5* 6.7*  --  6.8* 7.4* 7.1*    Liver Function Tests:  Recent Labs Lab 09/23/15 0440  09/25/15 0515 09/26/15 0520 09/27/15 0355 09/28/15 0600 09/29/15 0425  AST 75*  --   --  74*  --   --   --   ALT 11*  --   --  9*  --   --   --   ALKPHOS 226*  --   --  265*  --    --   --   BILITOT 5.0*  --   --  4.3*  --   --   --   PROT 7.0  --   --  6.7  --   --   --   ALBUMIN 1.8*  < > 1.7* 1.8* 1.9* 1.8* 2.0*  < > = values in this interval not displayed. No results for input(s): LIPASE, AMYLASE in the last 168 hours. No results for input(s): AMMONIA in the last 168 hours.  CBC:  Recent Labs Lab 09/25/15 1529 09/26/15 0520 09/27/15 1100 09/28/15 0600 09/29/15 0425  WBC 11.2* 9.6 11.2* 9.2 10.3  NEUTROABS  --  7.4  --  6.7 7.8*  HGB 8.8* 8.0* 8.9* 8.0* 8.2*  HCT 27.6* 25.4* 28.0* 25.5* 26.3*  MCV 83.1 80.9 81.9 82.3 81.9  PLT 225 208 232 187 181    INR:  Recent Labs Lab 09/25/15 0515 09/26/15 0520 09/27/15 0355 09/28/15 0600 09/29/15 0425  INR 2.33* 2.31* 2.58* 3.24* 3.16*    Other results:    Imaging: No results found.   Medications:     Scheduled Medications: .  allopurinol  100 mg Oral Daily  .  ceFAZolin (ANCEF) IV  2 g Intravenous Q24H  . citalopram  20 mg Oral Daily  . docusate sodium  200 mg Oral Daily  . feeding supplement (NEPRO CARB STEADY)  237 mL Oral BID BM  . furosemide  80 mg Oral BID  . hydrALAZINE  25 mg Oral Q8H  . metoCLOPramide  5 mg Oral Q6H  . pregabalin  75 mg Oral Daily  . rifampin  300 mg Oral Q12H  . sildenafil  20 mg Oral Q8H  . simethicone  80 mg Oral QID  . tamsulosin  0.4 mg Oral Daily  . vancomycin  125 mg Oral Q12H  . Warfarin - Pharmacist Dosing Inpatient   Does not apply q1800    Infusions:    PRN Medications: acetaminophen, Gerhardt's butt cream, guaiFENesin, ondansetron **OR** ondansetron (ZOFRAN) IV, oxyCODONE, sodium chloride flush, sorbitol, traMADol   Assessment:    1. LVAD Complication- Driveline abscess 2. MSSA bacteremia -> septic shock 3. Chronic systolic HF s/p VAD placement 9/16 4. C. Difficile colitis 5. 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 Acute blood loss anemia 8. Severe Malnutrition.  9. AKI - currently requiring HD.  10.  Klebsiella (ESBL) pneumonia 11. Funguria   Plan/Discussion:    Making progress. Urine output great. Creatinine still high though. Renal still on board (thanks)  Remains on cefazolin + rifampin for MSSA bacteremia and oral vanc for C difficile. Klebseilla PNA has been treated. PO intake has improved. Continue Ancef, oral Vanc, and Rifampin through June 20th. Finishes fluconazole for funguria.   Maps still a bit on high side. Continue hydralazine 25 mg every 8 hrs. Letting BP run a little to help renal recovery. Increase hydralazine as needed.  Weight and volume status stable.   Dr. Prescott Gum following wound.   VAD parameters ok.  Appreciate CIR. Discharge date set possibly for Tuesday but may not be ready from a renal standpoint.   I reviewed the LVAD parameters from today, and compared the results to the patient's prior recorded data.  No programming changes were made.  The LVAD is functioning within specified parameters.  The patient performs LVAD self-test daily.  LVAD interrogation was negative for any significant power changes, alarms or PI events/speed drops.  LVAD equipment check completed and is in good working order.  Back-up equipment present.   LVAD education done on emergency procedures and precautions and reviewed exit site care.  Length of Stay: 4  Glori Bickers, MD  09/29/2015, 8:10 AM  VAD Team --- VAD ISSUES ONLY--- Pager [phone] (7am - 7am)  Advanced Heart Failure Team  Pager [phone] (M-F; 7a - 4p)  Please contact [facility] for night-coverage after hours (4p -7a ) and weekends on [email or web address]

## 2015-09-29 NOTE — Progress Notes (Signed)
[place] for Coumadin Indication: LVAD   Allergies  Allergen Reactions  . Phytonadione Other (See Comments)    Patient has LVAD: please check with LVAD coordinator on call or LVAD MD on call before reversal of anticoagulation with vit k    Patient Measurements: Weight: 172 lb 9.9 oz (78.3 kg) Heparin Dosing Weight: n/a  Vital Signs:    Labs:  Recent Labs  09/27/15 0355  09/27/15 1100 09/28/15 0600 09/29/15 0425  HGB  --   < > 8.9* 8.0* 8.2*  HCT  --   --  28.0* 25.5* 26.3*  PLT  --   --  232 187 181  LABPROT 27.3*  --   --  32.5* 31.8*  INR 2.58*  --   --  3.24* 3.16*  CREATININE 6.70*  --   --  6.56* 6.64*  < > = values in this interval not displayed.  Estimated Creatinine Clearance: 12.9 mL/min (by C-G formula based on Cr of 6.64).   Medications:  Scheduled:  . allopurinol  100 mg Oral Daily  .  ceFAZolin (ANCEF) IV  2 g Intravenous Q24H  . citalopram  20 mg Oral Daily  . docusate sodium  200 mg Oral Daily  . feeding supplement (NEPRO CARB STEADY)  237 mL Oral BID BM  . furosemide  80 mg Oral BID  . hydrALAZINE  25 mg Oral Q8H  . metoCLOPramide  5 mg Oral Q6H  . pregabalin  75 mg Oral Daily  . rifampin  300 mg Oral Q12H  . sildenafil  20 mg Oral Q8H  . simethicone  80 mg Oral QID  . tamsulosin  0.4 mg Oral Daily  . vancomycin  125 mg Oral Q12H  . warfarin  0.5 mg Oral ONCE-1800  . Warfarin - Pharmacist Dosing Inpatient   Does not apply q1800    Assessment: 45 yo male with LVAD on chronic Coumadin.  Pharmacy now dosing at [place] request.  Patient receiving ancef and rifampin for driveline infection and MSSA bacteremia.  Also on fluconazole for yeast in urine. Anticipate difficulty keeping INR in range given drug interactions.  INR trending back down but still elevated at 3.16 today.  Fluconazole therapy has stopped- last dose at 12PM yesterday.  Rifampin remains on board.  CBC trending down - watch.  No bleeding  noted.   Goal of Therapy:  INR 2-2.5 Monitor platelets by anticoagulation protocol: Yes   Plan:  Low dose of Coumadin 0.5mg  po x1 tonight to keep therapeutic as suspect INR will drop without Fluconazole on board.  Daily PT/INR Monitor CBC with trend down.    Sloan Leiter, PharmD, BCPS Clinical Pharmacist [phone] 09/29/2015 9:48 AM

## 2015-09-29 NOTE — Progress Notes (Signed)
S: No new CO O:Pulse 103  Temp(Src) 98.2 F (36.8 C) (Oral)  Resp 17  Wt 78.3 kg (172 lb 9.9 oz)  SpO2 100%  Intake/Output Summary (Last 24 hours) at 09/29/15 0945 Last data filed at 09/29/15 0843  Gross per 24 hour  Intake    480 ml  Output   3060 ml  Net  -2580 ml   Weight change:  [id number] and alert CVS: RRR Resp:  Bibasilar crackles improving Abd: Bandage over LVAD, + BS NTND Ext: 0 edema NEURO: CNI Ox3 no asterixis RT IJ permcath Rt PICC   . allopurinol  100 mg Oral Daily  .  ceFAZolin (ANCEF) IV  2 g Intravenous Q24H  . citalopram  20 mg Oral Daily  . docusate sodium  200 mg Oral Daily  . feeding supplement (NEPRO CARB STEADY)  237 mL Oral BID BM  . furosemide  80 mg Oral BID  . hydrALAZINE  25 mg Oral Q8H  . metoCLOPramide  5 mg Oral Q6H  . pregabalin  75 mg Oral Daily  . rifampin  300 mg Oral Q12H  . sildenafil  20 mg Oral Q8H  . simethicone  80 mg Oral QID  . tamsulosin  0.4 mg Oral Daily  . vancomycin  125 mg Oral Q12H  . Warfarin - Pharmacist Dosing Inpatient   Does not apply q1800   No results found. BMET    Component Value Date/Time   NA 138 09/29/2015 0425   K 3.9 09/29/2015 0425   CL 101 09/29/2015 0425   CO2 25 09/29/2015 0425   GLUCOSE 97 09/29/2015 0425   BUN 55* 09/29/2015 0425   CREATININE 6.64* 09/29/2015 0425   CREATININE 0.92 02/01/2015 1132   CALCIUM 8.8* 09/29/2015 0425   GFRNONAA 9* 09/29/2015 0425   GFRAA 10* 09/29/2015 0425   CBC    Component Value Date/Time   WBC 10.3 09/29/2015 0425   RBC 3.21* 09/29/2015 0425   HGB 8.2* 09/29/2015 0425   HCT 26.3* 09/29/2015 0425   PLT 181 09/29/2015 0425   MCV 81.9 09/29/2015 0425   MCH 25.5* 09/29/2015 0425   MCHC 31.2 09/29/2015 0425   RDW 18.4* 09/29/2015 0425   LYMPHSABS 1.6 09/29/2015 0425   MONOABS 0.7 09/29/2015 0425   EOSABS 0.2 09/29/2015 0425   BASOSABS 0.0 09/29/2015 0425     Assessment: 1. ARF, nonoliguric.  Scr fractionally higher.   UO excellent 2. LVAD  abscess, MSSA  On ancef and rifampin 3. Anemia 4. Severe CHF 5. C diff on po vanco Plan: 1.  Recheck labs in AM 2. Decrease lasix to once a day  Jarelyn Bambach T

## 2015-09-29 NOTE — Progress Notes (Signed)
  Subjective: Patient improving strength on inpatient rehabilitation however still with some proximal muscle weakness Abdominal wound with some mucoid drainage which has been culture negative. The abdominal wall has closed over the VAD device except for one area which is being packed with iodoform 1/2 inch gauze and dry dressing each day. I would recommend continued local wound care which the patient is tolerating fairly well. We'll ask plastic surgery to check wound again. Would hold on rotational muscle flap closure until renal function is back to baseline.  Objective: Vital signs in last 24 hours: Temp:  [98.2 F (36.8 C)] 98.2 F (36.8 C) (06/10 1500) Pulse Rate:  [103] 103 (06/10 1500) Cardiac Rhythm:  [-]  Resp:  [17] 17 (06/10 1500) SpO2:  [100 %] 100 % (06/10 1500)  Hemodynamic parameters for last 24 hours:  Stable Intake/Output from previous day: 06/10 0701 - 06/11 0700 In: 480 [P.O.:480] Out: 2400 [Urine:2400] Intake/Output this shift: Total I/O In: 240 [P.O.:240] Out: 2260 [Urine:2260]  Some proximal lower extremity muscle weakness   breath sounds clear Abdomen soft and nondistended Minimal peripheral edema Normal VAD hum left lower chest  Lab Results:  Recent Labs  09/28/15 0600 09/29/15 0425  WBC 9.2 10.3  HGB 8.0* 8.2*  HCT 25.5* 26.3*  PLT 187 181   BMET:  Recent Labs  09/28/15 0600 09/29/15 0425  NA 140 138  K 4.1 3.9  CL 102 101  CO2 25 25  GLUCOSE 86 97  BUN 56* 55*  CREATININE 6.56* 6.64*  CALCIUM 8.9 8.8*    PT/INR:  Recent Labs  09/29/15 0425  LABPROT 31.8*  INR 3.16*   ABG    Component Value Date/Time   PHART 7.332* 09/09/2015 1145   HCO3 20.2 09/09/2015 1145   TCO2 26 09/16/2015 2318   ACIDBASEDEF 5.0* 09/09/2015 1145   O2SAT 70.2 09/25/2015 0515   CBG (last 3)   Recent Labs  09/27/15 0648  GLUCAP 83    Assessment/Plan: S/P   Continue IV Ancef for MSSA septicemia   continue daily dressing changes with  half-inch iodoform packing of one portion of the wound which has not completely healed over the VAD pump.   LOS: 4 days    [name] 09/29/2015

## 2015-09-30 ENCOUNTER — Inpatient Hospital Stay (HOSPITAL_COMMUNITY): Payer: 59 | Admitting: Occupational Therapy

## 2015-09-30 ENCOUNTER — Inpatient Hospital Stay (HOSPITAL_COMMUNITY): Payer: 59 | Admitting: Physical Therapy

## 2015-09-30 ENCOUNTER — Encounter (HOSPITAL_COMMUNITY): Payer: 59

## 2015-09-30 DIAGNOSIS — I5023 Acute on chronic systolic (congestive) heart failure: Secondary | ICD-10-CM

## 2015-09-30 LAB — CBC WITH DIFFERENTIAL/PLATELET
BASOS ABS: 0 10*3/uL (ref 0.0–0.1)
BASOS PCT: 0 %
EOS ABS: 0.1 10*3/uL (ref 0.0–0.7)
Eosinophils Relative: 1 %
HEMATOCRIT: 27.2 % — AB (ref 39.0–52.0)
HEMOGLOBIN: 8.6 g/dL — AB (ref 13.0–17.0)
Lymphocytes Relative: 13 %
Lymphs Abs: 1.2 10*3/uL (ref 0.7–4.0)
MCH: 25.6 pg — ABNORMAL LOW (ref 26.0–34.0)
MCHC: 31.6 g/dL (ref 30.0–36.0)
MCV: 81 fL (ref 78.0–100.0)
MONOS PCT: 7 %
Monocytes Absolute: 0.6 10*3/uL (ref 0.1–1.0)
NEUTROS ABS: 7 10*3/uL (ref 1.7–7.7)
NEUTROS PCT: 79 %
Platelets: 213 10*3/uL (ref 150–400)
RBC: 3.36 MIL/uL — AB (ref 4.22–5.81)
RDW: 18.4 % — ABNORMAL HIGH (ref 11.5–15.5)
WBC: 8.9 10*3/uL (ref 4.0–10.5)

## 2015-09-30 LAB — RENAL FUNCTION PANEL
ANION GAP: 14 (ref 5–15)
Albumin: 2 g/dL — ABNORMAL LOW (ref 3.5–5.0)
BUN: 53 mg/dL — ABNORMAL HIGH (ref 6–20)
CALCIUM: 9 mg/dL (ref 8.9–10.3)
CHLORIDE: 98 mmol/L — AB (ref 101–111)
CO2: 26 mmol/L (ref 22–32)
CREATININE: 6.42 mg/dL — AB (ref 0.61–1.24)
GFR, EST AFRICAN AMERICAN: 11 mL/min — AB (ref >60)
GFR, EST NON AFRICAN AMERICAN: 9 mL/min — AB (ref >60)
Glucose, Bld: 85 mg/dL (ref 65–99)
Phosphorus: 7.8 mg/dL — ABNORMAL HIGH (ref 2.5–4.6)
Potassium: 3.8 mmol/L (ref 3.5–5.1)
SODIUM: 138 mmol/L (ref 135–145)

## 2015-09-30 LAB — PROTIME-INR
INR: 2.46 — AB (ref 0.00–1.49)
Prothrombin Time: 26.4 seconds — ABNORMAL HIGH (ref 11.6–15.2)

## 2015-09-30 MED ORDER — WARFARIN SODIUM 4 MG PO TABS
4.0000 mg | ORAL_TABLET | Freq: Once | ORAL | Status: AC
  Administered 2015-09-30: 4 mg via ORAL
  Filled 2015-09-30: qty 1

## 2015-09-30 NOTE — Discharge Summary (Signed)
Discharge summary job # [id number]

## 2015-09-30 NOTE — Progress Notes (Signed)
Occupational Therapy Discharge Summary  Patient Details  Name: [name] MRN: [id number] Date of Birth: 1970-08-24  Patient has met 10 of 10 long term goals due to improved activity tolerance, improved balance, ability to compensate for deficits and improved awareness.  Patient to discharge at overall Modified Independent level.  Patient's brother and sister plans to stay with patient intermittently initially upon discharge.  No formal family education completed due to Mod I level and pt ability to direct his care as needed.  Reasons goals not met: N/A  Recommendation:  Patient will not require any follow up OT at this time.  Equipment: No equipment provided  Reasons for discharge: treatment goals met and discharge from hospital  Patient/family agrees with progress made and goals achieved: Yes  OT Discharge Precautions/Restrictions  Precautions Precaution Comments: LVAD; Attach Controller to [facility] (electric) PBU power during sleep or bedrest. Attach Controller to battery power during transport or ambulation. Do NOT transport on console. Secure Driveline/Controller at all times including ambulation and sleep with an attachment device. Complete safety checklist at beginning of each shift. Emergency travel pack to be with patient at all times. Extra batteries are charging in [place] at all times Pain Pain Assessment Pain Assessment: No/denies pain ADL  See Function Navigator Vision/Perception  Vision- History Baseline Vision/History: Wears glasses Wears Glasses: Reading only Patient Visual Report: No change from baseline Vision- Assessment Vision Assessment?: No apparent visual deficits  Cognition Overall Cognitive Status: Within Functional Limits for tasks assessed Arousal/Alertness: Awake/alert Orientation Level: Oriented X4 Attention: Divided Divided Attention: Appears intact Memory: Appears intact Awareness: Appears intact Problem Solving: Appears  intact Safety/Judgment: Appears intact Sensation Sensation Light Touch: Appears Intact Stereognosis: Not tested Hot/Cold: Not tested Proprioception: Appears Intact Coordination Gross Motor Movements are Fluid and Coordinated: Yes Fine Motor Movements are Fluid and Coordinated: Yes Finger Nose Finger Test: [facility] Motor  Motor Motor: Within Functional Limits Extremity/Trunk Assessment RUE Assessment RUE Assessment: Within Functional Limits LUE Assessment LUE Assessment: Within Functional Limits   See Function Navigator for Current Functional Status.  [name] 09/30/2015, 3:38 PM

## 2015-09-30 NOTE — Progress Notes (Signed)
Subjective/Complaints:  Up in room, no assistive device    ROS- negative for chest pain, shortness of breath, nausea, vomiting, diarrhea,, constipation  Objective: Vital Signs: Pulse 103, temperature 98.2 F (36.8 C), temperature source Oral, resp. rate 17, weight 78.3 kg (172 lb 9.9 oz), SpO2 100 %. No results found. Results for orders placed or performed during the hospital encounter of 09/25/15 (from the past 72 hour(s))  CBC     Status: Abnormal   Collection Time: 09/27/15 11:00 AM  Result Value Ref Range   WBC 11.2 (H) 4.0 - 10.5 K/uL   RBC 3.42 (L) 4.22 - 5.81 MIL/uL   Hemoglobin 8.9 (L) 13.0 - 17.0 g/dL   HCT 28.0 (L) 39.0 - 52.0 %   MCV 81.9 78.0 - 100.0 fL   MCH 26.0 26.0 - 34.0 pg   MCHC 31.8 30.0 - 36.0 g/dL   RDW 19.0 (H) 11.5 - 15.5 %   Platelets 232 150 - 400 K/uL  Protime-INR     Status: Abnormal   Collection Time: 09/28/15  6:00 AM  Result Value Ref Range   Prothrombin Time 32.5 (H) 11.6 - 15.2 seconds   INR 3.24 (H) 0.00 - 1.49  CBC with Differential/Platelet     Status: Abnormal   Collection Time: 09/28/15  6:00 AM  Result Value Ref Range   WBC 9.2 4.0 - 10.5 K/uL   RBC 3.10 (L) 4.22 - 5.81 MIL/uL   Hemoglobin 8.0 (L) 13.0 - 17.0 g/dL   HCT 25.5 (L) 39.0 - 52.0 %   MCV 82.3 78.0 - 100.0 fL   MCH 25.8 (L) 26.0 - 34.0 pg   MCHC 31.4 30.0 - 36.0 g/dL   RDW 18.8 (H) 11.5 - 15.5 %   Platelets 187 150 - 400 K/uL    Comment: PLATELET COUNT CONFIRMED BY SMEAR   Neutrophils Relative % 73 %   Neutro Abs 6.7 1.7 - 7.7 K/uL   Lymphocytes Relative 16 %   Lymphs Abs 1.5 0.7 - 4.0 K/uL   Monocytes Relative 8 %   Monocytes Absolute 0.7 0.1 - 1.0 K/uL   Eosinophils Relative 3 %   Eosinophils Absolute 0.2 0.0 - 0.7 K/uL   Basophils Relative 1 %   Basophils Absolute 0.1 0.0 - 0.1 K/uL  Renal function panel     Status: Abnormal   Collection Time: 09/28/15  6:00 AM  Result Value Ref Range   Sodium 140 135 - 145 mmol/L   Potassium 4.1 3.5 - 5.1 mmol/L     Comment: DELTA CHECK NOTED   Chloride 102 101 - 111 mmol/L   CO2 25 22 - 32 mmol/L   Glucose, Bld 86 65 - 99 mg/dL   BUN 56 (H) 6 - 20 mg/dL   Creatinine, Ser 6.56 (H) 0.61 - 1.24 mg/dL   Calcium 8.9 8.9 - 10.3 mg/dL   Phosphorus 7.4 (H) 2.5 - 4.6 mg/dL   Albumin 1.8 (L) 3.5 - 5.0 g/dL   GFR calc non Af Amer 9 (L) >60 mL/min   GFR calc Af Amer 10 (L) >60 mL/min    Comment: (NOTE) The eGFR has been calculated using the CKD EPI equation. This calculation has not been validated in all clinical situations. eGFR's persistently <60 mL/min signify possible Chronic Kidney Disease.    Anion gap 13 5 - 15  Protime-INR     Status: Abnormal   Collection Time: 09/29/15  4:25 AM  Result Value Ref Range   Prothrombin Time 31.8 (H)  11.6 - 15.2 seconds   INR 3.16 (H) 0.00 - 1.49  Renal function panel     Status: Abnormal   Collection Time: 09/29/15  4:25 AM  Result Value Ref Range   Sodium 138 135 - 145 mmol/L   Potassium 3.9 3.5 - 5.1 mmol/L   Chloride 101 101 - 111 mmol/L   CO2 25 22 - 32 mmol/L   Glucose, Bld 97 65 - 99 mg/dL   BUN 55 (H) 6 - 20 mg/dL   Creatinine, Ser 6.64 (H) 0.61 - 1.24 mg/dL   Calcium 8.8 (L) 8.9 - 10.3 mg/dL   Phosphorus 7.1 (H) 2.5 - 4.6 mg/dL   Albumin 2.0 (L) 3.5 - 5.0 g/dL   GFR calc non Af Amer 9 (L) >60 mL/min   GFR calc Af Amer 10 (L) >60 mL/min    Comment: (NOTE) The eGFR has been calculated using the CKD EPI equation. This calculation has not been validated in all clinical situations. eGFR's persistently <60 mL/min signify possible Chronic Kidney Disease.    Anion gap 12 5 - 15  CBC with Differential/Platelet     Status: Abnormal   Collection Time: 09/29/15  4:25 AM  Result Value Ref Range   WBC 10.3 4.0 - 10.5 K/uL   RBC 3.21 (L) 4.22 - 5.81 MIL/uL   Hemoglobin 8.2 (L) 13.0 - 17.0 g/dL   HCT 26.3 (L) 39.0 - 52.0 %   MCV 81.9 78.0 - 100.0 fL   MCH 25.5 (L) 26.0 - 34.0 pg   MCHC 31.2 30.0 - 36.0 g/dL   RDW 18.4 (H) 11.5 - 15.5 %   Platelets 181  150 - 400 K/uL    Comment: REPEATED TO VERIFY PLATELET COUNT CONFIRMED BY SMEAR    Neutrophils Relative % 75 %   Neutro Abs 7.8 (H) 1.7 - 7.7 K/uL   Lymphocytes Relative 15 %   Lymphs Abs 1.6 0.7 - 4.0 K/uL   Monocytes Relative 7 %   Monocytes Absolute 0.7 0.1 - 1.0 K/uL   Eosinophils Relative 2 %   Eosinophils Absolute 0.2 0.0 - 0.7 K/uL   Basophils Relative 0 %   Basophils Absolute 0.0 0.0 - 0.1 K/uL  Protime-INR     Status: Abnormal   Collection Time: 09/30/15  4:40 AM  Result Value Ref Range   Prothrombin Time 26.4 (H) 11.6 - 15.2 seconds   INR 2.46 (H) 0.00 - 1.49  Renal function panel     Status: Abnormal   Collection Time: 09/30/15  4:40 AM  Result Value Ref Range   Sodium 138 135 - 145 mmol/L   Potassium 3.8 3.5 - 5.1 mmol/L   Chloride 98 (L) 101 - 111 mmol/L   CO2 26 22 - 32 mmol/L   Glucose, Bld 85 65 - 99 mg/dL   BUN 53 (H) 6 - 20 mg/dL   Creatinine, Ser 6.42 (H) 0.61 - 1.24 mg/dL   Calcium 9.0 8.9 - 10.3 mg/dL   Phosphorus 7.8 (H) 2.5 - 4.6 mg/dL   Albumin 2.0 (L) 3.5 - 5.0 g/dL   GFR calc non Af Amer 9 (L) >60 mL/min   GFR calc Af Amer 11 (L) >60 mL/min    Comment: (NOTE) The eGFR has been calculated using the CKD EPI equation. This calculation has not been validated in all clinical situations. eGFR's persistently <60 mL/min signify possible Chronic Kidney Disease.    Anion gap 14 5 - 15  CBC with Differential/Platelet     Status: Abnormal  Collection Time: 09/30/15  4:40 AM  Result Value Ref Range   WBC 8.9 4.0 - 10.5 K/uL   RBC 3.36 (L) 4.22 - 5.81 MIL/uL   Hemoglobin 8.6 (L) 13.0 - 17.0 g/dL   HCT 27.2 (L) 39.0 - 52.0 %   MCV 81.0 78.0 - 100.0 fL   MCH 25.6 (L) 26.0 - 34.0 pg   MCHC 31.6 30.0 - 36.0 g/dL   RDW 18.4 (H) 11.5 - 15.5 %   Platelets 213 150 - 400 K/uL    Comment: PLATELET COUNT CONFIRMED BY SMEAR   Neutrophils Relative % 79 %   Neutro Abs 7.0 1.7 - 7.7 K/uL   Lymphocytes Relative 13 %   Lymphs Abs 1.2 0.7 - 4.0 K/uL   Monocytes  Relative 7 %   Monocytes Absolute 0.6 0.1 - 1.0 K/uL   Eosinophils Relative 1 %   Eosinophils Absolute 0.1 0.0 - 0.7 K/uL   Basophils Relative 0 %   Basophils Absolute 0.0 0.0 - 0.1 K/uL     HEENT: normal Cardio: RRR and LVAD hum Resp: CTA B/L and unlabored GI: BS positive and i'll distention nontender Extremity:  Pulses positive and No Edema Skin:   Intact and Other LVAD wire site remains clean and dry Neuro: Alert/Oriented, Normal Sensory and Abnormal Motor 4/5 bilateral deltoid, biceps, triceps, grip, hip flexor, knee extensor, ankle dorsal flexor plantar flexor Musc/Skel:  Normal and Other no pain with upper extremity lower extremity range of motion  wasn't  cervical spine Gen. no acute distress   Assessment/Plan: 1. Functional deficits secondary to Debility which require 3+ hours per day of interdisciplinary therapy in a comprehensive inpatient rehab setting. Physiatrist is providing close team supervision and 24 hour management of active medical problems listed below. Physiatrist and rehab team continue to assess barriers to discharge/monitor patient progress toward functional and medical goals. FIM: Function - Bathing Bathing activity did not occur:  (pt reports he completed prior to session) Position: Sitting EOB (standing for majority of bathing, seated EOB to wash lower legs and feet per pt report) Body parts bathed by patient: Right arm, Left arm, Chest, Abdomen, Front perineal area, Buttocks, Right upper leg, Left upper leg, Right lower leg, Left lower leg, Back Assist Level: Set up Set up : To obtain items  Function- Upper Body Dressing/Undressing Upper body dressing/undressing activity did not occur:  (no clothes on eval) What is the patient wearing?: Pull over shirt/dress Pull over shirt/dress - Perfomed by patient: Thread/unthread right sleeve, Thread/unthread left sleeve, Put head through opening, Pull shirt over trunk Assist Level: Supervision or verbal  cues Function - Lower Body Dressing/Undressing What is the patient wearing?: Underwear, Pants, Socks, Shoes Position: Sitting EOB Underwear - Performed by patient: Thread/unthread right underwear leg, Thread/unthread left underwear leg, Pull underwear up/down Pants- Performed by patient: Thread/unthread right pants leg, Thread/unthread left pants leg, Pull pants up/down, Fasten/unfasten pants Non-skid slipper socks- Performed by patient: Don/doff right sock, Don/doff left sock Socks - Performed by patient: Don/doff right sock, Don/doff left sock Shoes - Performed by patient: Fasten left, Fasten right, Don/doff left shoe, Don/doff right shoe TED Hose - Performed by patient: Don/doff right TED hose, Don/doff left TED hose Assist for footwear: Setup Assist for lower body dressing: Set up  Function - Toileting Toileting steps completed by patient: Adjust clothing prior to toileting, Performs perineal hygiene, Adjust clothing after toileting Toileting Assistive Devices: Grab bar or rail Assist level: More than reasonable time  Function Midwife  transfer assistive device: Grab bar Assist level to toilet: No Help, no cues, assistive device, takes more than a reasonable amount of time Assist level from toilet: No Help, no cues, assistive device, takes more than a reasonable amount of time  Function - Chair/bed transfer Chair/bed transfer assist level: Supervision or verbal cues Chair/bed transfer assistive device: Armrests Chair/bed transfer details: Verbal cues for technique, Verbal cues for sequencing  Function - Locomotion: Wheelchair Will patient use wheelchair at discharge?: No Function - Locomotion: Ambulation Assistive device: No device Max distance: 225 Assist level: Supervision or verbal cues Assist level: Supervision or verbal cues Assist level: Supervision or verbal cues Assist level: Supervision or verbal cues Assist level: Supervision or verbal  cues  Function - Comprehension Comprehension: Auditory Comprehension assist level: Follows complex conversation/direction with no assist  Function - Expression Expression: Verbal Expression assist level: Expresses complex ideas: With no assist  Function - Social Interaction Social Interaction assist level: Interacts appropriately with others - No medications needed.  Function - Problem Solving Problem solving assist level: Solves complex problems: Recognizes & self-corrects  Function - Memory Memory assist level: Complete Independence: No helper Patient normally able to recall (first 3 days only): Current season, Location of own room, Staff names and faces, That he or she is in a hospital   Medical Problem List and Plan: 1.  Debilitation secondary to C. Difficile/MSSA and abscess associated with LVAD. Status post irrigation debridement of chest sternal wound 09/09/2015-Plan D/C in am  -moving well 2.  DVT Prophylaxis/Anticoagulation: Coumadin therapy for history of atrial fibrillation. Monitor for any bleeding episodes, pharmacy protocol 3. Pain Management: Lyrica 75 mg daily,Ultram '50mg'$  every 6 hour, Oxycodone as needed 4. Mood: Celexa 20 mg daily 5. Neuropsych: This patient is capable of making decisions on his own behalf. 6. Skin/Wound Care: Routine skin checks 7. Fluids/Electrolytes/Nutrition:  I personally reviewed the patient's labs today.  8. ID. LVAD driveline infection/MSSA. Presently day 36/42 Cefazolin and rifampin.Follow up with infectious disease Dr. Linus Salmons 9. Clostridium difficile. Twice a day vancomycin through 10/08/2015. 10. AKI. Follow-up renal services.No pending plan for dialysis at this time. Follow-up chemistries are stable 11. Hypertension. Hydralazine 25 mg every 8 hours, Revatio 20 mg 3 times a day 12. Ileus. Resolved. Diet advance. Nausea felt to be related to antibiotics. Continue Reglan for now 13. Atrial fibrillation. Cardiac rate controlled. Continue  Coumadin 14. Acute on chronic anemia. Follow-up CBC hemoglobin 8.1-9.2, 8.6 today 15.Gout.Monitor for flare up. Continue Zyloprim  LOS (Days) 5 A FACE TO FACE EVALUATION WAS PERFORMED  Vendetta Pittinger E 09/30/2015, 9:09 AM

## 2015-09-30 NOTE — Discharge Instructions (Addendum)
Inpatient Rehab Discharge Instructions  MIKLO THREATS Discharge date and time: No discharge date for patient encounter.   Activities/Precautions/ Functional Status: Activity: activity as tolerated Diet: renal diet with 1200 mL fluid restriction Wound Care: keep wound clean and dry Functional status:  ___ No restrictions     ___ Walk up steps independently ___ 24/7 supervision/assistance   ___ Walk up steps with assistance ___ Intermittent supervision/assistance  ___ Bathe/dress independently ___ Walk with walker     _x__ Bathe/dress with assistance ___ Walk Independently    ___ Shower independently ___ Walk with assistance    ___ Shower with assistance ___ No alcohol     ___ Return to work/school ________  Special Instructions:  Home health nurse to check INR on 10/04/2015 results to [facility] Cou[facility] [phone] fax number [phone]   Home health nurse to administer intravenous Ancef 2 g daily through 10/08/2015 and stop   COMMUNITY REFERRALS UPON DISCHARGE:    Home Health:   [facility]   Date of last service:10/01/2015  Medical Equipment/Items Ordered:NONE NEEDED   GENERAL COMMUNITY RESOURCES FOR PATIENT/FAMILY: GRIEF SUPPORT GROUPS:   My questions have been answered and I understand these instructions. I will adhere to these goals and the provided educational materials after my discharge from the hospital.  Patient/Caregiver Signature _______________________________ Date __________  Clinician Signature _______________________________________ Date __________  Please bring this form and your medication list with you to all your follow-up doctor's appointments.   Information on my medicine - Coumadin   (Warfarin)  This medication education was reviewed with me or my healthcare representative as part of my discharge preparation.  The pharmacist that spoke with me during my hospital stay was:  Georgina Peer,  [facility]  Why was Coumadin prescribed for you? Coumadin was prescribed for you because you have a blood clot or a medical condition that can cause an increased risk of forming blood clots. Blood clots can cause serious health problems by blocking the flow of blood to the heart, lung, or brain. Coumadin can prevent harmful blood clots from forming. As a reminder your indication for Coumadin is:   Blood Clot Prevention After Heart Pump Surgery  What test will check on my response to Coumadin? While on Coumadin (warfarin) you will need to have an INR test regularly to ensure that your dose is keeping you in the desired range. The INR (international normalized ratio) number is calculated from the result of the laboratory test called prothrombin time (PT).  If an INR APPOINTMENT HAS NOT ALREADY BEEN MADE FOR YOU please schedule an appointment to have this lab work done by your health care provider within 7 days. Your INR goal is 2 - 2.5 What  do you need to  know  About  COUMADIN? Take Coumadin (warfarin) exactly as prescribed by your healthcare provider about the same time each day.  DO NOT stop taking without talking to the doctor who prescribed the medication.  Stopping without other blood clot prevention medication to take the place of Coumadin may increase your risk of developing a new clot or stroke.  Get refills before you run out.  What do you do if you miss a dose? If you miss a dose, take it as soon as you remember on the same day then continue your regularly scheduled regimen the next day.  Do not take two doses of Coumadin at the same time.  Important Safety Information A possible side effect  of Coumadin (Warfarin) is an increased risk of bleeding. You should call your healthcare provider right away if you experience any of the following: ? Bleeding from an injury or your nose that does not stop. ? Unusual colored urine (red or dark brown) or unusual colored stools (red or black). ? Unusual  bruising for unknown reasons. ? A serious fall or if you hit your head (even if there is no bleeding).  Some foods or medicines interact with Coumadin (warfarin) and might alter your response to warfarin. To help avoid this: ? Eat a balanced diet, maintaining a consistent amount of Vitamin K. ? Notify your provider about major diet changes you plan to make. ? Avoid alcohol or limit your intake to 1 drink for women and 2 drinks for men per day. (1 drink is 5 oz. wine, 12 oz. beer, or 1.5 oz. liquor.)  Make sure that ANY health care provider who prescribes medication for you knows that you are taking Coumadin (warfarin).  Also make sure the healthcare provider who is monitoring your Coumadin knows when you have started a new medication including herbals and non-prescription products.  Coumadin (Warfarin)  Major Drug Interactions  Increased Warfarin Effect Decreased Warfarin Effect  Alcohol (large quantities) Antibiotics (esp. Septra/Bactrim, Flagyl, Cipro) Amiodarone (Cordarone) Aspirin (ASA) Cimetidine (Tagamet) Megestrol (Megace) NSAIDs (ibuprofen, naproxen, etc.) Piroxicam (Feldene) Propafenone (Rythmol SR) Propranolol (Inderal) Isoniazid (INH) Posaconazole (Noxafil) Barbiturates (Phenobarbital) Carbamazepine (Tegretol) Chlordiazepoxide (Librium) Cholestyramine (Questran) Griseofulvin Oral Contraceptives Rifampin Sucralfate (Carafate) Vitamin K   Coumadin (Warfarin) Major Herbal Interactions  Increased Warfarin Effect Decreased Warfarin Effect  Garlic Ginseng Ginkgo biloba Coenzyme Q10 Green tea St. Johns wort    Coumadin (Warfarin) FOOD Interactions  Eat a consistent number of servings per week of foods HIGH in Vitamin K (1 serving =  cup)  Collards (cooked, or boiled & drained) Kale (cooked, or boiled & drained) Mustard greens (cooked, or boiled & drained) Parsley *serving size only =  cup Spinach (cooked, or boiled & drained) Swiss chard (cooked, or boiled  & drained) Turnip greens (cooked, or boiled & drained)  Eat a consistent number of servings per week of foods MEDIUM-HIGH in Vitamin K (1 serving = 1 cup)  Asparagus (cooked, or boiled & drained) Broccoli (cooked, boiled & drained, or raw & chopped) Brussel sprouts (cooked, or boiled & drained) *serving size only =  cup Lettuce, raw (green leaf, endive, romaine) Spinach, raw Turnip greens, raw & chopped   These websites have more information on Coumadin (warfarin):  FailFactory.se; VeganReport.com.au;

## 2015-09-30 NOTE — Progress Notes (Signed)
ANTICOAGULATION CONSULT NOTE   Pharmacy Consult for Coumadin Indication: LVAD   Labs:  Recent Labs  09/28/15 0600 09/29/15 0425 09/30/15 0440  HGB 8.0* 8.2* 8.6*  HCT 25.5* 26.3* 27.2*  PLT 187 181 213  LABPROT 32.5* 31.8* 26.4*  INR 3.24* 3.16* 2.46*  CREATININE 6.56* 6.64* 6.42*    Estimated Creatinine Clearance: 13.3 mL/min (by C-G formula based on Cr of 6.42).   Assessment: 45 yo male with LVAD on chronic Coumadin.  Pharmacy now dosing at [facility] request.  Patient receiving ancef and rifampin for driveline infection and MSSA bacteremia.  Also on fluconazole for yeast in urine. Anticipate difficulty keeping INR in range given drug interactions.  INR trending down to 2.4 this am after holding on 6/10.  Fluconazole therapy has stopped- last dose at 12PM yesterday.  Rifampin remains on board.  CBC improved   No bleeding noted.   Goal of Therapy:  INR 2-2.5 Monitor platelets by anticoagulation protocol: Yes   Plan:  Warfarin 4 mg tonight Daily PT/INR  PTA warfarin dose was 4mg  daily - he is off fluconazole but continues on rifampin which should increase his warfarin requirements but likely haven't really seen full effect yet At this point the recommended warfarin regimen at d/c would be 4mg  daily. Will need close follow up with high drug interactions.   Erin Hearing PharmD., BCPS Clinical Pharmacist Pager [phone] 09/30/2015 11:39 AM

## 2015-09-30 NOTE — Progress Notes (Addendum)
Physical Therapy Discharge Summary  Patient Details  Name: [name] MRN: [id number] Date of Birth: [birth year]  Today's Date: 09/30/2015 PT Individual Time: 0850-1010 PT Individual Time Calculation (min): 80 min    Patient has met 6 of 7 long term goals due to improved activity tolerance, improved balance, improved postural control, increased strength, improved attention and improved awareness.  Patient to discharge at an ambulatory level Independent.      Recommendation:  Patient will benefit from ongoing skilled PT services in outpatient setting to continue to address ongoing impairments in cardiovascular endurance and rehab, and minimize fall risk.  Equipment: No equipment provided  Reasons for discharge: treatment goals met  Patient/family agrees with progress made and goals achieved: Yes  PT Discharge Precautions/Restrictions Precautions Precaution Comments: LVAD; Attach Controller to [facility] (electric) PBU power during sleep or bedrest. Attach Controller to battery power during transport or ambulation. Do NOT transport on console. Secure Driveline/Controller at all times including ambulation and sleep with an attachment device. Complete safety checklist at beginning of each shift. Emergency travel pack to be with patient at all times. Extra batteries are charging in [place] at all times Restrictions Weight Bearing Restrictions: No Pain Pain Assessment Pain Assessment: No/denies pain    Cognition Overall Cognitive Status: Within Functional Limits for tasks assessed Arousal/Alertness: Awake/alert Orientation Level: Oriented X4 Attention: Divided Alternating Attention: Appears intact Divided Attention: Appears intact Memory: Appears intact Awareness: Appears intact Problem Solving: Appears intact Safety/Judgment: Appears intact Sensation Sensation Light Touch: Appears Intact Stereognosis: Not tested Hot/Cold: Not tested Proprioception: Appears  Intact Coordination Fine Motor Movements are Fluid and Coordinated: Yes Motor  Motor Motor: Within Functional Limits  Mobility Bed Mobility Bed Mobility: Rolling Right;Rolling Left;Right Sidelying to Sit;Left Sidelying to Sit;Supine to Sit;Sit to Supine;Sit to Sidelying Left;Sit to Sidelying Right Rolling Right: 7: Independent Rolling Left: 7: Independent Right Sidelying to Sit: 7: Independent Left Sidelying to Sit: 7: Independent Supine to Sit: 7: Independent Sit to Supine: 7: Independent Sit to Sidelying Right: 7: Independent Sit to Sidelying Left: 7: Independent Transfers Transfers: Yes Sit to Stand: 7: Independent Stand to Sit: 7: Independent Stand Pivot Transfers: 7: Independent Locomotion  Patient ambulated greater than 300 feetx 3 independent. No overt LOB noted  Patient negotiated 15 steps with right handrail mod I. Step over pattern.      Balance Balance Balance Assessed: Yes Standardized Balance Assessment Standardized Balance Assessment: Berg Balance Test;Dynamic Gait Index Berg Balance Test Sit to Stand: Able to stand without using hands and stabilize independently Standing Unsupported: Able to stand safely 2 minutes Sitting with Back Unsupported but Feet Supported on Floor or Stool: Able to sit safely and securely 2 minutes Stand to Sit: Sits safely with minimal use of hands Transfers: Able to transfer safely, minor use of hands Standing Unsupported with Eyes Closed: Able to stand 10 seconds safely Standing Ubsupported with Feet Together: Able to place feet together independently and stand 1 minute safely From Standing, Reach Forward with Outstretched Arm: Can reach confidently >25 cm (10") From Standing Position, Pick up Object from Floor: Able to pick up shoe safely and easily From Standing Position, Turn to Look Behind Over each Shoulder: Looks behind from both sides and weight shifts well Turn 360 Degrees: Able to turn 360 degrees safely but  slowly Standing Unsupported, Alternately Place Feet on Step/Stool: Able to stand independently and safely and complete 8 steps in 20 seconds Standing Unsupported, One Foot in Front: Able to plae foot ahead of the  other independently and hold 30 seconds Standing on One Leg: Tries to lift leg/unable to hold 3 seconds but remains standing independently Total Score: 50 Dynamic Gait Index Level Surface: Normal Change in Gait Speed: Normal Gait with Horizontal Head Turns: Mild Impairment Gait with Vertical Head Turns: Normal Gait and Pivot Turn: Normal Step Over Obstacle: Mild Impairment Step Around Obstacles: Normal Steps: Mild Impairment Total Score: 21 Static Sitting Balance Static Sitting - Level of Assistance: 7: Independent Dynamic Sitting Balance Dynamic Sitting - Level of Assistance: 7: Independent Static Standing Balance Static Standing - Balance Support: No upper extremity supported Static Standing - Level of Assistance: 7: Independent Dynamic Standing Balance Dynamic Standing - Balance Support: No upper extremity supported;During functional activity Dynamic Standing - Level of Assistance: 7: Independent Extremity Assessment: bilateral lower extremity bilateral lower extremity.     Standing there ex: B heel raises 40x Mini squats 40x.    Patient seen 1545-1555 10 minutes  Review fall education and safety. Patient educated to call 911 if a fall occurs ad there is bleeding , and injury or he can not remember how he fell.  Patient performed floor to bed transfer. Patient transitioned from bed to floor mod I. Patient then transition from floor to bed with minimal assist. Patient's brother will be staying with patient upon discharge. Educated patient to have brother assist if needed. Attempted to try transfer additional time however patient declined. Patient states he will be fine. And his brother can assist if needed.       See Function Navigator for Current Functional  Status.  Retta Diones 09/30/2015, 9:19 AM

## 2015-09-30 NOTE — Discharge Summary (Signed)
NAMEMarland [name]  EFSTATHIOS, SCHLAGETER NO.:  [id number]  MEDICAL RECORD NO.:  [id number]  LOCATION:  [id number]                        FACILITY:  [facility]  PHYSICIAN:  Charlett Blake, M.D.DATE OF BIRTH:  04-Feb-1971  DATE OF ADMISSION:  09/23/2015 DATE OF DISCHARGE:  10/02/2015                              DISCHARGE SUMMARY   DISCHARGE DIAGNOSES: 1. Debilitation secondary to Clostridium difficile-MSSA abscess     associated with LVAD. 2. Chronic Coumadin for atrial fibrillation. 3. Pain management. 4. Acute kidney injury. 5. Clostridium difficile. 6. Hypertension. 7. Ileus, resolved. 8. Atrial fibrillation. 9. Acute on chronic anemia. 10.Gout.  HISTORY OF PRESENT ILLNESS:  This is a 45 year old right-handed male, history of systolic congestive heart failure, LVAD on December 20, 2014, atrial fibrillation on chronic Coumadin, and medical noncompliance. Lives alone in [place], independent prior to admission.  Recently traveled to [place] to see his mother, developed fever and abdominal pain.  Went to [facility] in [place], [place]. Initially felt to have pyelonephritis, but a CT scan negative.  Found to have Clostridium difficile colitis, placed on oral vancomycin. Developed subxiphoid swelling and subsequently had CT, which showed large abscess surrounding driveline at LVAD site.  Blood culture showed MSSA.  Infectious Disease consulted, started on nafcillin, transferred to [facility] for further management.  Since initial infection was superficial to pump and walled off, so pump exchange was not indicated.  Underwent irrigation and debridement of chest sternal wound on Sep 09, 2015, with closure of abdominal wound on Sep 18, 2015, per Dr. Prescott Gum.  TEE showed no vegetation, remained on cefazolin and rifampin for 42-day course.  Hospital course complicated by ileus, diet slowly advanced, bouts of nausea felt to be secondary to  antibiotics. Nephrology Service followup on Sep 03, 2015, for elevated creatinine to 7.13, renal ultrasound negative, he did receive hemodialysis for a short time.  Kidney function rebounding nicely.  The patient was admitted for comprehensive rehab program.  PAST MEDICAL HISTORY:  See discharge diagnoses.  SOCIAL HISTORY:  Lives alone, independent prior to admission. Functional status upon admission to rehab services:  Minimal guard, 350 feet rolling walker; minimal guard, sit to supine, min assist for activities of daily living.  PHYSICAL EXAMINATION:  VITAL SIGNS:  Blood pressure 107/81, pulse 96, temperature 98, respirations 18. GENERAL:  This was an alert male, oriented x3. LUNGS:  Clear to auscultation without wheeze. CARDIAC:  Irregularly irregular. ABDOMEN:  Soft, nontender.  Good bowel sounds.  REHABILITATION HOSPITAL COURSE:  The patient was admitted to inpatient rehab services with therapies initiated on a 3-hour daily basis, consisting of physical therapy, occupational therapy, and rehabilitation nursing.  The following issues were addressed during the patient's rehabilitation stay.  Pertaining to [name]' debilitation related to MSSA abscess associated with LVAD, had undergone irrigation and debridement of sternal chest wound.  He would follow up with Cardiothoracic Surgery, Dr. Prescott Gum, as well as Cardiology Service, Dr. Haroldine [name].  Coumadin for atrial fibrillation, to be followed Coumadin [facility], no bleeding episodes.  Pain management with the use of Lyrica, Ultram, and oxycodone in good results.  He would remain on antibiotic therapy x42-day course and a home health nurse  had been arranged for antibiotic care.  Kidney function remained slow, but stable, followed very closely by Renal Services, latest creatinine 6.42, the patient was voiding without difficulty, Lasix was adjusted, again anticipation was good rebound of renal function with time, he would follow  up outpatient Renal Services.  Blood pressures remained well controlled.  His diet was advanced to regular, tolerating well.  Acute on chronic anemia, latest hemoglobin 8.6.  The patient received weekly collaborative interdisciplinary team conferences to discuss estimated length of stay, family teaching, any barriers to discharge.  The patient was ambulating extended distances throughout the hallway at supervision, strength and endurance continued to improve, working with energy conservation techniques.  He was able to navigate stairs without a problem.  Discussed all issues in regards of safety.  He could gather his belongings for activities of daily living and homemaking.  Full family teaching was completed and plan discharge to home.  DISCHARGE MEDICATIONS: 1. Zyloprim 100 mg p.o. daily. 2. Intravenous Ancef 2 g daily through October 08, 2015, and stop. 3. Celexa 20 mg p.o. daily. 4. Colace 200 mg p.o. daily. 5. Lasix 80 mg p.o. daily. 6. Hydralazine 25 mg p.o. every 8 hours. 7. Reglan 5 mg p.o. every 6 hours. 8. Oxycodone immediate release 5 mg every 3 hours as needed pain,     dispense of 90 tablets. 9. Lyrica 75 mg p.o. daily. 10.Rifampin 300 mg p.o. every 12 hours through October 05, 2015, and     stop. 11.Revatio 20 mg p.o. every 8 hours. 12.Flomax 0.4 mg daily. 13.Vancomycin oral solution 125 mg every 12 hours through October 05, 2015, and stop. 14.Coumadin latest dose of 0.5 mg adjusted accordingly for INR of 2.0-     3.0.  DIET:  His diet was a renal 1200 mL fluid restriction.  Dressing change with aquacel packing to abdominal wound Monday Wednesday Friday.  Home health nurse to administer intravenous Ancef through October 05, 2015, and stop. Home health nurse to check INR on October 04, 2015, and results to [facility] [phone], fax [phone].     Lauraine Rinne, P.A.   ______________________________ Charlett Blake, M.D.    DA/MEDQ  D:   09/30/2015  T:  09/30/2015  Job:  [id number]  cc:   Ivin Poot, M.D. Sandi Mariscal, Dr. Windy Kalata, M.D. Shaune Pascal. [name], MD Doroteo Bradford. Johnnye Sima, M.D.

## 2015-09-30 NOTE — Progress Notes (Signed)
Social Work Patient ID: [name], male   DOB: 1971/02/27, 45 y.o.   MRN: [id number] Team feels pt will meet goals and be ready for discharge tomorrow and MD agrees with this. Plan for discharge tomorrow, have let [facility] know regarding IV antibiotics and follow up therapies. Pt is agreeable to this plan and feels comfortable with discharge tomorrow.

## 2015-09-30 NOTE — Progress Notes (Signed)
[facility] ROUNDING NOTE   Subjective:   Interval History:  improved urine output and renal function  Objective:  Vital signs in last 24 hours:     Weight change:  Filed Weights   09/26/15 0500 09/27/15 0505  Weight: 78.1 kg (172 lb 2.9 oz) 78.3 kg (172 lb 9.9 oz)    Intake/Output: I/O last 3 completed shifts: In: 600 [P.O.:600] Out: 3060 [Urine:3060]   Intake/Output this shift:  Total I/O In: 240 [P.O.:240] Out: 1800 [Urine:1800]  [id number] and alert CVS: RRR Resp: Bibasilar crackles improving Abd: Bandage over LVAD, + BS NTND Ext: 0 edema NEURO: CNI Ox3 no asterixis RT IJ permcath Rt PICC   Basic Metabolic Panel:  Recent Labs Lab 09/25/15 0515 09/26/15 0520 09/27/15 0355 09/28/15 0600 09/29/15 0425 09/30/15 0440  NA 139 140 141 140 138 138  K 5.1 4.5 5.0 4.1 3.9 3.8  CL 106 109 107 102 101 98*  CO2 20* 20* 21* 25 25 26   GLUCOSE 74 84 73 86 97 85  BUN 58* 59* 58* 56* 55* 53*  CREATININE 6.63* 6.78* 6.70* 6.56* 6.64* 6.42*  CALCIUM 8.7* 8.6* 8.7* 8.9 8.8* 9.0  PHOS 6.7*  --  6.8* 7.4* 7.1* 7.8*    Liver Function Tests:  Recent Labs Lab 09/26/15 0520 09/27/15 0355 09/28/15 0600 09/29/15 0425 09/30/15 0440  AST 74*  --   --   --   --   ALT 9*  --   --   --   --   ALKPHOS 265*  --   --   --   --   BILITOT 4.3*  --   --   --   --   PROT 6.7  --   --   --   --   ALBUMIN 1.8* 1.9* 1.8* 2.0* 2.0*   No results for input(s): LIPASE, AMYLASE in the last 168 hours. No results for input(s): AMMONIA in the last 168 hours.  CBC:  Recent Labs Lab 09/26/15 0520 09/27/15 1100 09/28/15 0600 09/29/15 0425 09/30/15 0440  WBC 9.6 11.2* 9.2 10.3 8.9  NEUTROABS 7.4  --  6.7 7.8* 7.0  HGB 8.0* 8.9* 8.0* 8.2* 8.6*  HCT 25.4* 28.0* 25.5* 26.3* 27.2*  MCV 80.9 81.9 82.3 81.9 81.0  PLT 208 232 187 181 213    Cardiac Enzymes: No results for input(s): CKTOTAL, CKMB, CKMBINDEX, TROPONINI in the last 168 hours.  BNP: Invalid input(s):  POCBNP  CBG:  Recent Labs Lab 09/25/15 1222 09/25/15 1610 09/26/15 0708 09/26/15 1154 09/27/15 0648  GLUCAP 74 89 89 88 83    Microbiology: Results for orders placed or performed during the hospital encounter of 08/26/15  MRSA PCR Screening     Status: None   Collection Time: 08/26/15 10:00 PM  Result Value Ref Range Status   MRSA by PCR NEGATIVE NEGATIVE Final    Comment:        The GeneXpert MRSA Assay (FDA approved for NASAL specimens only), is one component of a comprehensive MRSA colonization surveillance program. It is not intended to diagnose MRSA infection nor to guide or monitor treatment for MRSA infections.   Stat Gram stain     Status: None   Collection Time: 08/27/15  7:30 AM  Result Value Ref Range Status   Specimen Description ABSCESS ABDOMEN  Final   Special Requests NONE  Final   Gram Stain   Final    MODERATE WBC PRESENT,BOTH PMN AND MONONUCLEAR NO ORGANISMS SEEN  Report Status 08/27/2015 FINAL  Final  Culture, routine-abscess     Status: None   Collection Time: 08/27/15  7:30 AM  Result Value Ref Range Status   Specimen Description ABSCESS ABDOMEN  Final   Special Requests NONE  Final   Gram Stain   Final    MODERATE WBC PRESENT,BOTH PMN AND MONONUCLEAR NO SQUAMOUS EPITHELIAL CELLS SEEN NO ORGANISMS SEEN Performed at [facility] Performed at [facility]    Culture   Final    FEW STAPHYLOCOCCUS AUREUS Note: RIFAMPIN AND GENTAMICIN SHOULD NOT BE USED AS SINGLE DRUGS FOR TREATMENT OF STAPH INFECTIONS. This organism DOES NOT demonstrate inducible Clindamycin resistance in vitro. Performed at [facility]    Report Status 08/30/2015 FINAL  Final   Organism ID, Bacteria STAPHYLOCOCCUS AUREUS  Final      Susceptibility   Staphylococcus aureus - MIC*    CLINDAMYCIN <=0.25 SENSITIVE Sensitive     ERYTHROMYCIN >=8 RESISTANT Resistant     GENTAMICIN <=0.5 SENSITIVE Sensitive     LEVOFLOXACIN 0.25 SENSITIVE Sensitive      OXACILLIN 0.5 SENSITIVE Sensitive     RIFAMPIN <=0.5 SENSITIVE Sensitive     TRIMETH/SULFA <=10 SENSITIVE Sensitive     VANCOMYCIN 1 SENSITIVE Sensitive     TETRACYCLINE <=1 SENSITIVE Sensitive     MOXIFLOXACIN <=0.25 SENSITIVE Sensitive     * FEW STAPHYLOCOCCUS AUREUS  Culture, blood (Routine X 2) w Reflex to ID Panel     Status: None   Collection Time: 08/27/15 10:38 AM  Result Value Ref Range Status   Specimen Description BLOOD RIGHT HAND  Final   Special Requests IN PEDIATRIC BOTTLE 3CC  Final   Culture NO GROWTH 5 DAYS  Final   Report Status 09/01/2015 FINAL  Final  Culture, blood (Routine X 2) w Reflex to ID Panel     Status: None   Collection Time: 08/27/15 10:42 AM  Result Value Ref Range Status   Specimen Description BLOOD LEFT HAND  Final   Special Requests IN PEDIATRIC BOTTLE 3CC  Final   Culture NO GROWTH 5 DAYS  Final   Report Status 09/01/2015 FINAL  Final  Anaerobic culture     Status: None   Collection Time: 08/27/15  5:48 PM  Result Value Ref Range Status   Specimen Description WOUND CHEST  Final   Special Requests NONE  Final   Gram Stain   Final    NO WBC SEEN NO SQUAMOUS EPITHELIAL CELLS SEEN NO ORGANISMS SEEN Performed at [facility]    Culture   Final    NO ANAEROBES ISOLATED Performed at [facility]    Report Status 09/01/2015 FINAL  Final  Wound culture     Status: None   Collection Time: 08/27/15  5:48 PM  Result Value Ref Range Status   Specimen Description WOUND CHEST  Final   Special Requests NONE  Final   Gram Stain   Final    NO WBC SEEN NO SQUAMOUS EPITHELIAL CELLS SEEN NO ORGANISMS SEEN Performed at [facility]    Culture   Final    NO GROWTH 2 DAYS Performed at [facility]    Report Status 08/30/2015 FINAL  Final  Culture, respiratory (NON-Expectorated)     Status: None   Collection Time: 09/02/15  8:30 AM  Result Value Ref Range Status   Specimen Description TRACHEAL ASPIRATE  Final    Special Requests Normal  Final   Gram Stain  Final    MODERATE WBC PRESENT,BOTH PMN AND MONONUCLEAR FEW SQUAMOUS EPITHELIAL CELLS PRESENT FEW GRAM NEGATIVE RODS Performed at [facility]    Culture   Final    MODERATE KLEBSIELLA PNEUMONIAE Performed at [facility]    Report Status 09/04/2015 FINAL  Final   Organism ID, Bacteria KLEBSIELLA PNEUMONIAE  Final      Susceptibility   Klebsiella pneumoniae - MIC*    AMPICILLIN >=32 RESISTANT Resistant     AMPICILLIN/SULBACTAM 16 INTERMEDIATE Intermediate     CEFEPIME <=1 SENSITIVE Sensitive     CEFTAZIDIME <=1 SENSITIVE Sensitive     CEFTRIAXONE <=1 SENSITIVE Sensitive     CIPROFLOXACIN <=0.25 SENSITIVE Sensitive     GENTAMICIN <=1 SENSITIVE Sensitive     IMIPENEM <=0.25 SENSITIVE Sensitive     PIP/TAZO 16 SENSITIVE Sensitive     TOBRAMYCIN <=1 SENSITIVE Sensitive     TRIMETH/SULFA Value in next row Sensitive      <=20 SENSITIVE(NOTE)    * MODERATE KLEBSIELLA PNEUMONIAE  Culture, blood (single) w Reflex to ID Panel     Status: None   Collection Time: 09/02/15 10:20 AM  Result Value Ref Range Status   Specimen Description BLOOD LEFT HAND  Final   Special Requests IN PEDIATRIC BOTTLE 1CC  Final   Culture NO GROWTH 5 DAYS  Final   Report Status 09/07/2015 FINAL  Final  Urine culture     Status: Abnormal   Collection Time: 09/02/15  5:30 PM  Result Value Ref Range Status   Specimen Description URINE, CLEAN CATCH  Final   Special Requests NONE  Final   Culture MULTIPLE SPECIES PRESENT, SUGGEST RECOLLECTION (A)  Final   Report Status 09/04/2015 FINAL  Final  C difficile quick scan w PCR reflex     Status: None   Collection Time: 09/15/15  9:25 AM  Result Value Ref Range Status   C Diff antigen NEGATIVE NEGATIVE Final   C Diff toxin NEGATIVE NEGATIVE Final   C Diff interpretation Negative for toxigenic C. difficile  Final  Urine culture     Status: Abnormal   Collection Time: 09/17/15 11:17 AM  Result Value Ref  Range Status   Specimen Description URINE, CATHETERIZED  Final   Special Requests NONE  Final   Culture >=100,000 COLONIES/mL YEAST (A)  Final   Report Status 09/18/2015 FINAL  Final  Aerobic Culture (superficial specimen) (NOT AT [facility])     Status: None   Collection Time: 09/17/15  1:24 PM  Result Value Ref Range Status   Specimen Description WOUND ABDOMEN  Final   Special Requests PATIENT ON FOLLOWING  VANCOMYCIN  Final   Gram Stain   Final    ABUNDANT WBC PRESENT,BOTH PMN AND MONONUCLEAR NO ORGANISMS SEEN    Culture NO GROWTH 2 DAYS  Final   Report Status 09/20/2015 FINAL  Final  Culture, blood (routine x 2)     Status: None   Collection Time: 09/17/15  9:29 PM  Result Value Ref Range Status   Specimen Description BLOOD BLOOD LEFT HAND  Final   Special Requests BOTTLES DRAWN AEROBIC AND ANAEROBIC 5CC  Final   Culture NO GROWTH 5 DAYS  Final   Report Status 09/22/2015 FINAL  Final  Culture, blood (routine x 2)     Status: None   Collection Time: 09/17/15  9:36 PM  Result Value Ref Range Status   Specimen Description BLOOD BLOOD LEFT HAND  Final   Special Requests BOTTLES DRAWN AEROBIC  ONLY 5CC  Final   Culture NO GROWTH 5 DAYS  Final   Report Status 09/22/2015 FINAL  Final  Culture, Urine     Status: Abnormal   Collection Time: 09/21/15  9:35 AM  Result Value Ref Range Status   Specimen Description URINE, RANDOM  Final   Special Requests NONE  Final   Culture >=100,000 COLONIES/mL YEAST (A)  Final   Report Status 09/22/2015 FINAL  Final  Culture, blood (Routine X 2) w Reflex to ID Panel     Status: None   Collection Time: 09/21/15  9:40 AM  Result Value Ref Range Status   Specimen Description BLOOD BLOOD LEFT HAND  Final   Special Requests IN PEDIATRIC BOTTLE 3CC  Final   Culture NO GROWTH 5 DAYS  Final   Report Status 09/26/2015 FINAL  Final  Culture, blood (Routine X 2) w Reflex to ID Panel     Status: None   Collection Time: 09/21/15  9:50 AM  Result Value Ref Range  Status   Specimen Description BLOOD BLOOD LEFT ARM  Final   Special Requests IN PEDIATRIC BOTTLE 3CC  Final   Culture NO GROWTH 5 DAYS  Final   Report Status 09/26/2015 FINAL  Final  Aerobic Culture (superficial specimen) (NOT AT [facility])     Status: None   Collection Time: 09/22/15 11:56 AM  Result Value Ref Range Status   Specimen Description ABDOMEN  Final   Special Requests Normal  Final   Gram Stain   Final    FEW WBC PRESENT,BOTH PMN AND MONONUCLEAR NO ORGANISMS SEEN    Culture NO GROWTH 2 DAYS  Final   Report Status 09/24/2015 FINAL  Final    Coagulation Studies:  Recent Labs  09/28/15 0600 09/29/15 0425 09/30/15 0440  LABPROT 32.5* 31.8* 26.4*  INR 3.24* 3.16* 2.46*    Urinalysis: No results for input(s): COLORURINE, LABSPEC, PHURINE, GLUCOSEU, HGBUR, BILIRUBINUR, KETONESUR, PROTEINUR, UROBILINOGEN, NITRITE, LEUKOCYTESUR in the last 72 hours.  Invalid input(s): APPERANCEUR    Imaging: No results found.   Medications:     . allopurinol  100 mg Oral Daily  .  ceFAZolin (ANCEF) IV  2 g Intravenous Q24H  . citalopram  20 mg Oral Daily  . docusate sodium  200 mg Oral Daily  . feeding supplement (NEPRO CARB STEADY)  237 mL Oral BID BM  . furosemide  80 mg Oral Daily  . hydrALAZINE  25 mg Oral Q8H  . metoCLOPramide  5 mg Oral Q6H  . pregabalin  75 mg Oral Daily  . rifampin  300 mg Oral Q12H  . sildenafil  20 mg Oral Q8H  . simethicone  80 mg Oral QID  . tamsulosin  0.4 mg Oral Daily  . vancomycin  125 mg Oral Q12H  . Warfarin - Pharmacist Dosing Inpatient   Does not apply q1800   acetaminophen, Gerhardt's butt cream, guaiFENesin, ondansetron **OR** ondansetron (ZOFRAN) IV, oxyCODONE, sodium chloride flush, sorbitol, traMADol  Assessment/ Plan:  Summary  [name] is a 45 y.o. right handed male with history of systolic congestive heart failure, LVAD 12/20/2014, atrial fibrillation maintained on Coumadin, medical noncompliance. Patient lives alone in  [place]. Independent prior to admission. He has a brother in [place]. Recently traveled to [place] to see his mother and developed fever chills and abdominal pain. Went to [facility] in [place]. Initially felt to have pyelonephritis but CT scan negative. Found to have C. difficile colitis placed on oral vancomycin. Developed  subxiphoid swelling and subsequent had a CT which showed large abscess surrounding driveline at LVAD site. Blood cultures showed MSSA. Seen by infectious disease started on nafcillin and transferred to [facility] for further management. Since initial infection was superficial to pump and walled off so pump exchange was not indicated. Patient underwent irrigation and debridement of chest sternal wound 09/09/2015 with closure of abdominal wound 09/18/2015 per Dr. Nils Pyle. TEE showed no vegetation on the valves were ICD wire. Presently remains on cefazolin and rifampin day 30/42 completed. Hospital course ileus and diet slowly advanced with nasogastric tube remaining in place for a short time. Bouts of nausea vomiting felt to be related to antibiotic and rifampin or change to intravenous due to nausea. He remains on twice a day vancomycin for C. difficile through 10/08/2015. Nephrology consulted 09/03/2015 for AKI elevated 1.40-3.89-7.13. Renal ultrasound negative. Hemodialysis initiated. Physical therapy evaluation completed with recommendations to physical medicine rehabilitation consult. Patient was admitted for a comprehensive rehabilitation program  1. ARF, nonoliguric. Scr a little better   UO excellent  Not dialysis dependent will follow 2. LVAD abscess, MSSA On ancef and rifampin 3. Anemia 4. Severe CHF 5. C diff on po vanco    LOS: 5 [name] W @TODAY @10 :53 AM

## 2015-09-30 NOTE — Progress Notes (Signed)
Occupational Therapy Session Note  Patient Details  Name: [name] MRN: [id number] Date of Birth: 02/11/1971  Today's Date: 09/30/2015 OT Individual Time: [id number] OT Individual Time Calculation (min): 115 min    Short Term Goals: Week 1:  OT Short Term Goal 1 (Week 1): STG = LTGS due to short ELOS  Skilled Therapeutic Interventions/Progress Updates:    Treatment session with focus on functional mobility, dynamic standing balance, and endurance during meal prep task.  Pt reports already bathing and dressing this AM without assist.  Ambulated to ADL kitchen without AD.  Pt completed full meal prep of boiling noodles, obtaining items from refrigerator and various drawers and cabinets without assist.  Pt self-monitored throughout session and chose seated rest breaks as needed.  Utilized external methods for reminders, ie setting timer, but still checking on meal throughout baking.  Engaged in dynamic balance activities on Wii with focus on endurance and awareness of needed rest breaks and balance.  Completed Wii bowling and Wii fit balance board activities with pt demonstrating improved balance reactions and endurance throughout session.  Pt retrieved meal from oven without assist and demonstrated good safety awareness when retrieving meal as well as serving.  Returned to room and left seated EOB with all needs in reach.  Pt reports no further questions and feels ready for d/c tomorrow.  Therapy Documentation Precautions:  Precautions Precautions: Fall Precaution Comments: LVAD; Attach Controller to [facility] (electric) PBU power during sleep or bedrest. Attach Controller to battery power during transport or ambulation. Do NOT transport on console. Secure Driveline/Controller at all times including ambulation and sleep with an attachment device. Complete safety checklist at beginning of each shift. Emergency travel pack to be with patient at all times. Extra batteries are charging in [place] at all times Restrictions Weight Bearing Restrictions: No General:   Vital Signs: Therapy Vitals BP: (!) 92/0 mmHg Pain:  Pt with no c/o pain  See Function Navigator for Current Functional Status.   Therapy/Group: Individual Therapy  [name] 09/30/2015, 2:50 PM

## 2015-09-30 NOTE — Patient Care Conference (Signed)
Inpatient RehabilitationTeam Conference and Plan of Care Update Date: 10/02/2015   Time: 11:00 AM    Patient Name: [place] Record Number: [id number]  Date of Birth: May 19, 1970 Sex: Male         Room/Bed: 4W04C/4W04C-01 Payor Info: Payor: Theme park manager / Plan: [employer] OTHER / Product Type: *No Product type* /    Admitting Diagnosis: Debility , LVAD  Admit Date/Time:  09/25/2015  4:46 PM Admission Comments: No comment available   Primary Diagnosis:  Wound infection ([place]) Principal Problem: Wound infection [facility])  Patient Active Problem List   Diagnosis Date Noted  . Wound infection ([place])   . Debility 09/25/2015  . Debilitated 09/25/2015  . Chronic systolic congestive heart failure ([place])   . Colitis   . Septic shock ([place])   . Enteritis due to Clostridium difficile   . Staphylococcus aureus bacteremia   . 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[place])   . Abscess of abdominal wall   . Acute on chronic systolic (congestive) heart failure ([place])   . Left ventricular assist device (LVAD) complication 83/15/1761  . Epistaxis 05/21/2015  . Situational depression 04/13/2015  . Acute gout 01/15/2015  . Palliative care encounter 12/17/2014  . Cardiogenic shock ([place])   . Acute on chronic systolic CHF (congestive heart failure) ([place])   . Acute on chronic renal failure ([place])   . Chronic atrial fibrillation ([place])   . Low output heart failure ([place]) 12/14/2014  . Acute abdominal pain   . Near syncope   . Hypotension 11/30/2014  . AKI (acute kidney injury) ([place])   . Abnormal LFTs 10/26/2012  . Hyperthyroidism 05/26/2011  . 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 ICD-[employer] 07/22/2010  . Nonischemic cardiomyopathy ([place]) 07/22/2010  . Essential hypertension, benign 11/21/2008  . SYSTOLIC HEART FAILURE, CHRONIC 10/23/2008    Expected Discharge Date: Expected Discharge Date: 10/01/15  Team Members Present: Physician leading  conference: Dr. [name] Social Worker Present: Ovidio Kin, LCSW Nurse Present: Dorien Chihuahua, RN PT Present: Raylene Everts, PT OT Present: [name], OT PPS Coordinator present : Daiva Nakayama, RN, CRRN     Current Status/Progress Goal Weekly Team Focus  Medical   s/p LVAD infection with debility  Home d/c with IV abx  place single lumen PICC   Bowel/Bladder   Continent of Bowel/Bladder: LBM 6/13  Patient to continue being continent Bowel/Bladder  Monitor patient bowel and bladder Q shift and as needed   Swallow/Nutrition/ Hydration             ADL's   Mod I overall  Mod I overall  Mod I overall   Mobility   Independent overall  Independent overall  Independent overall   Communication             Safety/Cognition/ Behavioral Observations            Pain   Patient denied any pain or discomfort  <3 on the scale 0-10  Monitor  and treat patient pain Q shift and as needed   Skin   ABD incision   Patient skin to be free of skin breakdown/infection  Monitor patient skin q shift      *See Care Plan and progress notes for long and short-term goals.  Barriers to Discharge: see above    Possible Resolutions to Barriers:  D/c today    Discharge Planning/Teaching Needs:    Home with intermittent assist form friends and family. Pt did well here  and reached mod/i level goals.     Team Discussion:  Pt reached mod/i level goals quickly, medically will still need to finish up IV antibiotics. Sister to stay with short time for transition home. Pt pleased with progress and going home. Medical issues cardiology wants to watch renal function for next few days. DC from therapies met his goals. Mod/i in room  Revisions to Treatment Plan:  DC 6/14   Continued Need for Acute Rehabilitation Level of Care: The patient requires daily medical management by a physician with specialized training in physical medicine and rehabilitation for the following conditions: Daily direction of a  multidisciplinary physical rehabilitation program to ensure safe treatment while eliciting the highest outcome that is of practical value to the patient.: Yes Daily medical management of patient stability for increased activity during participation in an intensive rehabilitation regime.: Yes Daily analysis of laboratory values and/or radiology reports with any subsequent need for medication adjustment of medical intervention for : Wound care problems;Cardiac problems  Elease Hashimoto 10/02/2015, 12:46 PM

## 2015-09-30 NOTE — Progress Notes (Signed)
HeartMate 2 Rounding Note  Subjective:     Transferred to CIR 6/7.   Creatinine still up 6.7-> 6.5 -> 6.4. Urine output steady.  Feels OK today. Made full independent by CIR today.  Plans for d/c tomorrow.   INR 2.46 MAPs 90s  VAD INTERROGATION:  HeartMate II LVAD: Flow 4.2 liters/min, speed 9200, power 5.3  PI 7.5.  .   Objective:    Vital Signs:   BP: (92)/(0) 92/0 mmHg (06/12 1359) Last BM Date: 09/29/15 Mean arterial Pressure 90-100  Intake/Output:   Intake/Output Summary (Last 24 hours) at 09/30/15 1705 Last data filed at 09/30/15 1314  Gross per 24 hour  Intake    600 ml  Output   1800 ml  Net  -1200 ml    Physical Exam: General: Lying in bed. NAD HEENT: normal Neck: supple. JVP flat, Carotids 2+ bilat; no bruits. No thyromegaly or lymphadenopathy noted.  R subclavian trialysis cath dressing dry Cor: Mechanical heart sounds with LVAD hum present. Lungs: Clear, normal effort  Abdomen: soft, some tenderness, ND, no HSM. No bruits or masses. +BS  Driveline: C/D/I; securement device intact and driveline incorporated Extremities: no cyanosis, clubbing, rash, trace edema  Neuro: A & O x3  Telemetry: NSR  Labs: Basic Metabolic Panel:  Recent Labs Lab 09/25/15 0515 09/26/15 0520 09/27/15 0355 09/28/15 0600 09/29/15 0425 09/30/15 0440  NA 139 140 141 140 138 138  K 5.1 4.5 5.0 4.1 3.9 3.8  CL 106 109 107 102 101 98*  CO2 20* 20* 21* 25 25 26   GLUCOSE 74 84 73 86 97 85  BUN 58* 59* 58* 56* 55* 53*  CREATININE 6.63* 6.78* 6.70* 6.56* 6.64* 6.42*  CALCIUM 8.7* 8.6* 8.7* 8.9 8.8* 9.0  PHOS 6.7*  --  6.8* 7.4* 7.1* 7.8*    Liver Function Tests:  Recent Labs Lab 09/26/15 0520 09/27/15 0355 09/28/15 0600 09/29/15 0425 09/30/15 0440  AST 74*  --   --   --   --   ALT 9*  --   --   --   --   ALKPHOS 265*  --   --   --   --   BILITOT 4.3*  --   --   --   --   PROT 6.7  --   --   --   --   ALBUMIN 1.8* 1.9* 1.8* 2.0* 2.0*   No results  for input(s): LIPASE, AMYLASE in the last 168 hours. No results for input(s): AMMONIA in the last 168 hours.  CBC:  Recent Labs Lab 09/26/15 0520 09/27/15 1100 09/28/15 0600 09/29/15 0425 09/30/15 0440  WBC 9.6 11.2* 9.2 10.3 8.9  NEUTROABS 7.4  --  6.7 7.8* 7.0  HGB 8.0* 8.9* 8.0* 8.2* 8.6*  HCT 25.4* 28.0* 25.5* 26.3* 27.2*  MCV 80.9 81.9 82.3 81.9 81.0  PLT 208 232 187 181 213    INR:  Recent Labs Lab 09/26/15 0520 09/27/15 0355 09/28/15 0600 09/29/15 0425 09/30/15 0440  INR 2.31* 2.58* 3.24* 3.16* 2.46*    Other results:    Imaging: No results found.   Medications:     Scheduled Medications: . allopurinol  100 mg Oral Daily  .  ceFAZolin (ANCEF) IV  2 g Intravenous Q24H  . citalopram  20 mg Oral Daily  . docusate sodium  200 mg Oral Daily  . feeding supplement (NEPRO CARB STEADY)  237 mL Oral BID BM  . furosemide  80 mg Oral Daily  .  hydrALAZINE  25 mg Oral Q8H  . metoCLOPramide  5 mg Oral Q6H  . pregabalin  75 mg Oral Daily  . rifampin  300 mg Oral Q12H  . sildenafil  20 mg Oral Q8H  . simethicone  80 mg Oral QID  . tamsulosin  0.4 mg Oral Daily  . vancomycin  125 mg Oral Q12H  . warfarin  4 mg Oral ONCE-1800  . Warfarin - Pharmacist Dosing Inpatient   Does not apply q1800    Infusions:    PRN Medications: acetaminophen, Gerhardt's butt cream, guaiFENesin, ondansetron **OR** ondansetron (ZOFRAN) IV, oxyCODONE, sodium chloride flush, sorbitol, traMADol   Assessment:    1. LVAD Complication- Driveline abscess 2. MSSA bacteremia -> septic shock 3. Chronic systolic HF s/p VAD placement 9/16 4. C. Difficile colitis 5. 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 Acute blood loss anemia 8. Severe Malnutrition.  9. AKI - currently requiring HD.  10. Klebsiella (ESBL) pneumonia 11. Funguria   Plan/Discussion:    Urine output remains stable. Creatinine remains elevated. Renal still following.   Remains on  cefazolin + rifampin for MSSA bacteremia and oral vanc for C difficile. Klebseilla PNA has been treated. Continue Ancef, oral Vanc, and Rifampin through June 20th. Finishes fluconazole for funguria.   Maps 90s today. Continue hydralazine 25 mg every 8 hrs. Letting BP run a little to help renal recovery. Can increase hydralazine as needed.  Weight and volume status stable.   Dr. Prescott Gum following wound.   VAD parameters ok.  Appreciate CIR. Discharge date tentatively set for tomorrow, but do not think patient is ready from wound or renal standpoint. Will discuss further with CIR    I reviewed the LVAD parameters from today, and compared the results to the patient's prior recorded data.  No programming changes were made.  The LVAD is functioning within specified parameters.  The patient performs LVAD self-test daily.  LVAD interrogation was negative for any significant power changes, alarms or PI events/speed drops.  LVAD equipment check completed and is in good working order.  Back-up equipment present.   LVAD education done on emergency procedures and precautions and reviewed exit site care.  Length of Stay: 5  Annamaria Helling  09/30/2015, 5:05 PM  VAD Team --- VAD ISSUES ONLY--- Pager [phone] (7am - 7am)  Advanced Heart Failure Team  Pager [phone] (M-F; 7a - 4p)  Please contact [facility] for night-coverage after hours (4p -7a ) and weekends on [email or web address]   Patient seen and examined with Oda Kilts, PA-C. We discussed all aspects of the encounter. I agree with the assessment and plan as stated above.   Continues to work with rehab. Walking room. Creatinine down slightly. Dr. Prescott Gum redressed wound today. Volume status stable. VAD parameters stable. Discussed with Dr. Letta Pate. He will stay in CIR 2-3 more days and if medical issues not resolved will need to be readmitted to inpatient service. Hopefully we can get him home with home health.  [name],  Daniel,MD 6:57 PM    Overall making progress but not medically stable for d/c yet.

## 2015-09-30 NOTE — Plan of Care (Signed)
Problem: RH SKIN INTEGRITY Goal: RH STG ABLE TO PERFORM INCISION/WOUND CARE W/ASSISTANCE STG Able To Perform Incision/Wound Care With total Assistance.  Outcome: Not Progressing Family will need education on this

## 2015-10-01 LAB — CBC WITH DIFFERENTIAL/PLATELET
BASOS PCT: 1 %
Basophils Absolute: 0.1 10*3/uL (ref 0.0–0.1)
EOS ABS: 0.2 10*3/uL (ref 0.0–0.7)
Eosinophils Relative: 2 %
HCT: 26 % — ABNORMAL LOW (ref 39.0–52.0)
Hemoglobin: 8.2 g/dL — ABNORMAL LOW (ref 13.0–17.0)
Lymphocytes Relative: 16 %
Lymphs Abs: 1.6 10*3/uL (ref 0.7–4.0)
MCH: 25.6 pg — AB (ref 26.0–34.0)
MCHC: 31.5 g/dL (ref 30.0–36.0)
MCV: 81.3 fL (ref 78.0–100.0)
MONO ABS: 0.9 10*3/uL (ref 0.1–1.0)
Monocytes Relative: 9 %
NEUTROS ABS: 7.5 10*3/uL (ref 1.7–7.7)
Neutrophils Relative %: 72 %
PLATELETS: 197 10*3/uL (ref 150–400)
RBC: 3.2 MIL/uL — ABNORMAL LOW (ref 4.22–5.81)
RDW: 18.8 % — AB (ref 11.5–15.5)
WBC: 10.3 10*3/uL (ref 4.0–10.5)

## 2015-10-01 LAB — RENAL FUNCTION PANEL
ALBUMIN: 2 g/dL — AB (ref 3.5–5.0)
Anion gap: 15 (ref 5–15)
BUN: 59 mg/dL — AB (ref 6–20)
CALCIUM: 9 mg/dL (ref 8.9–10.3)
CO2: 25 mmol/L (ref 22–32)
CREATININE: 6.53 mg/dL — AB (ref 0.61–1.24)
Chloride: 95 mmol/L — ABNORMAL LOW (ref 101–111)
GFR calc Af Amer: 11 mL/min — ABNORMAL LOW (ref >60)
GFR calc non Af Amer: 9 mL/min — ABNORMAL LOW (ref >60)
GLUCOSE: 81 mg/dL (ref 65–99)
Phosphorus: 7.8 mg/dL — ABNORMAL HIGH (ref 2.5–4.6)
Potassium: 3.7 mmol/L (ref 3.5–5.1)
SODIUM: 135 mmol/L (ref 135–145)

## 2015-10-01 LAB — PROTIME-INR
INR: 2.18 — ABNORMAL HIGH (ref 0.00–1.49)
PROTHROMBIN TIME: 24 s — AB (ref 11.6–15.2)

## 2015-10-01 MED ORDER — WARFARIN SODIUM 5 MG PO TABS
5.0000 mg | ORAL_TABLET | Freq: Once | ORAL | Status: AC
Start: 2015-10-01 — End: 2015-10-01
  Administered 2015-10-01: 5 mg via ORAL
  Filled 2015-10-01: qty 1

## 2015-10-01 NOTE — Progress Notes (Signed)
Driveline Dressing change:   Drive line is being maintained by VAD coordinator. Denies any fevers, chills, drainage or foul odor. VAD dressing removed and site care performed using sterile technique. Drive line exit site cleaned with Chlora prep applicators x 2, allowed to dry, and Sorbaview dressing with biopatch on exit site applied. Exit site with complete tissue ingrowth, the velour is fully implanted at exit site. No redness, tenderness, drainage, or foul odor noted. New anchor device attached. Driveline dressing is being changed weekly per sterile technique.   Tanda Rockers RN, BSN  VAD Coordinator

## 2015-10-01 NOTE — Progress Notes (Signed)
ANTICOAGULATION CONSULT NOTE   Pharmacy Consult for Coumadin Indication: LVAD   Labs:  Recent Labs  09/29/15 0425 09/30/15 0440 10/01/15 0410  HGB 8.2* 8.6* 8.2*  HCT 26.3* 27.2* 26.0*  PLT 181 213 197  LABPROT 31.8* 26.4* 24.0*  INR 3.16* 2.46* 2.18*  CREATININE 6.64* 6.42* 6.53*    Estimated Creatinine Clearance: 13.1 mL/min (by C-G formula based on Cr of 6.53).   Assessment: 45 yo male with LVAD on chronic Coumadin.  Pharmacy now dosing at [place] request.  Patient receiving ancef and rifampin for driveline infection and MSSA bacteremia.  Also on fluconazole for yeast in urine. Anticipate difficulty keeping INR in range given drug interactions.  INR trending down to 2.1 this am after holding on 6/10.  Fluconazole therapy has stopped  Rifampin remains on board CBC improved   No bleeding noted.   Will follow warfarin requirements closely.  Goal of Therapy:  INR 2-2.5 Monitor platelets by anticoagulation protocol: Yes   Plan:  Warfarin 5 mg tonight Daily PT/INR  Erin Hearing PharmD., BCPS Clinical Pharmacist Pager [phone] 10/01/2015 11:58 AM

## 2015-10-01 NOTE — Progress Notes (Signed)
[facility] ROUNDING NOTE   Subjective:   Interval History: no complaints today  Objective:  Vital signs in last 24 hours:  BP: (92)/(0) 92/0 mmHg (06/12 1359)  Weight change:  Filed Weights   09/26/15 0500 09/27/15 0505  Weight: 78.1 kg (172 lb 2.9 oz) 78.3 kg (172 lb 9.9 oz)    Intake/Output: I/O last 3 completed shifts: In: 480 [P.O.:480] Out: 1800 [Urine:1800]   Intake/Output this shift:  Total I/O In: 360 [P.O.:360] Out: -   General: Lying in bed. NAD HEENT: normal Neck: supple. JVP flat, Carotids 2+ bilat; no bruits. No thyromegaly or lymphadenopathy noted.  R subclavian trialysis cath dressing dry Cor: Mechanical heart sounds with LVAD hum present. Lungs: Clear, normal effort  Abdomen: soft, some tenderness, ND, no HSM. No bruits or masses. +BS  Driveline: C/D/I; securement device intact and driveline incorporated Extremities: no cyanosis, clubbing, rash, trace edema  Neuro: A & O x3   Basic Metabolic Panel:  Recent Labs Lab 09/27/15 0355 09/28/15 0600 09/29/15 0425 09/30/15 0440 10/01/15 0410  NA 141 140 138 138 135  K 5.0 4.1 3.9 3.8 3.7  CL 107 102 101 98* 95*  CO2 21* 25 25 26 25   GLUCOSE 73 86 97 85 81  BUN 58* 56* 55* 53* 59*  CREATININE 6.70* 6.56* 6.64* 6.42* 6.53*  CALCIUM 8.7* 8.9 8.8* 9.0 9.0  PHOS 6.8* 7.4* 7.1* 7.8* 7.8*    Liver Function Tests:  Recent Labs Lab 09/26/15 0520 09/27/15 0355 09/28/15 0600 09/29/15 0425 09/30/15 0440 10/01/15 0410  AST 74*  --   --   --   --   --   ALT 9*  --   --   --   --   --   ALKPHOS 265*  --   --   --   --   --   BILITOT 4.3*  --   --   --   --   --   PROT 6.7  --   --   --   --   --   ALBUMIN 1.8* 1.9* 1.8* 2.0* 2.0* 2.0*   No results for input(s): LIPASE, AMYLASE in the last 168 hours. No results for input(s): AMMONIA in the last 168 hours.  CBC:  Recent Labs Lab 09/26/15 0520 09/27/15 1100 09/28/15 0600 09/29/15 0425 09/30/15 0440 10/01/15 0410  WBC  9.6 11.2* 9.2 10.3 8.9 10.3  NEUTROABS 7.4  --  6.7 7.8* 7.0 7.5  HGB 8.0* 8.9* 8.0* 8.2* 8.6* 8.2*  HCT 25.4* 28.0* 25.5* 26.3* 27.2* 26.0*  MCV 80.9 81.9 82.3 81.9 81.0 81.3  PLT 208 232 187 181 213 197    Cardiac Enzymes: No results for input(s): CKTOTAL, CKMB, CKMBINDEX, TROPONINI in the last 168 hours.  BNP: Invalid input(s): POCBNP  CBG:  Recent Labs Lab 09/25/15 1222 09/25/15 1610 09/26/15 0708 09/26/15 1154 09/27/15 0648  GLUCAP 74 89 89 88 83    Microbiology: Results for orders placed or performed during the hospital encounter of 08/26/15  MRSA PCR Screening     Status: None   Collection Time: 08/26/15 10:00 PM  Result Value Ref Range Status   MRSA by PCR NEGATIVE NEGATIVE Final    Comment:        The GeneXpert MRSA Assay (FDA approved for NASAL specimens only), is one component of a comprehensive MRSA colonization surveillance program. It is not intended to diagnose MRSA infection nor to guide or monitor treatment for MRSA infections.   Stat  Gram stain     Status: None   Collection Time: 08/27/15  7:30 AM  Result Value Ref Range Status   Specimen Description ABSCESS ABDOMEN  Final   Special Requests NONE  Final   Gram Stain   Final    MODERATE WBC PRESENT,BOTH PMN AND MONONUCLEAR NO ORGANISMS SEEN    Report Status 08/27/2015 FINAL  Final  Culture, routine-abscess     Status: None   Collection Time: 08/27/15  7:30 AM  Result Value Ref Range Status   Specimen Description ABSCESS ABDOMEN  Final   Special Requests NONE  Final   Gram Stain   Final    MODERATE WBC PRESENT,BOTH PMN AND MONONUCLEAR NO SQUAMOUS EPITHELIAL CELLS SEEN NO ORGANISMS SEEN Performed at [facility] Performed at [facility]    Culture   Final    FEW STAPHYLOCOCCUS AUREUS Note: RIFAMPIN AND GENTAMICIN SHOULD NOT BE USED AS SINGLE DRUGS FOR TREATMENT OF STAPH INFECTIONS. This organism DOES NOT demonstrate inducible Clindamycin resistance in vitro. Performed  at [facility]    Report Status 08/30/2015 FINAL  Final   Organism ID, Bacteria STAPHYLOCOCCUS AUREUS  Final      Susceptibility   Staphylococcus aureus - MIC*    CLINDAMYCIN <=0.25 SENSITIVE Sensitive     ERYTHROMYCIN >=8 RESISTANT Resistant     GENTAMICIN <=0.5 SENSITIVE Sensitive     LEVOFLOXACIN 0.25 SENSITIVE Sensitive     OXACILLIN 0.5 SENSITIVE Sensitive     RIFAMPIN <=0.5 SENSITIVE Sensitive     TRIMETH/SULFA <=10 SENSITIVE Sensitive     VANCOMYCIN 1 SENSITIVE Sensitive     TETRACYCLINE <=1 SENSITIVE Sensitive     MOXIFLOXACIN <=0.25 SENSITIVE Sensitive     * FEW STAPHYLOCOCCUS AUREUS  Culture, blood (Routine X 2) w Reflex to ID Panel     Status: None   Collection Time: 08/27/15 10:38 AM  Result Value Ref Range Status   Specimen Description BLOOD RIGHT HAND  Final   Special Requests IN PEDIATRIC BOTTLE 3CC  Final   Culture NO GROWTH 5 DAYS  Final   Report Status 09/01/2015 FINAL  Final  Culture, blood (Routine X 2) w Reflex to ID Panel     Status: None   Collection Time: 08/27/15 10:42 AM  Result Value Ref Range Status   Specimen Description BLOOD LEFT HAND  Final   Special Requests IN PEDIATRIC BOTTLE 3CC  Final   Culture NO GROWTH 5 DAYS  Final   Report Status 09/01/2015 FINAL  Final  Anaerobic culture     Status: None   Collection Time: 08/27/15  5:48 PM  Result Value Ref Range Status   Specimen Description WOUND CHEST  Final   Special Requests NONE  Final   Gram Stain   Final    NO WBC SEEN NO SQUAMOUS EPITHELIAL CELLS SEEN NO ORGANISMS SEEN Performed at [facility]    Culture   Final    NO ANAEROBES ISOLATED Performed at [facility]    Report Status 09/01/2015 FINAL  Final  Wound culture     Status: None   Collection Time: 08/27/15  5:48 PM  Result Value Ref Range Status   Specimen Description WOUND CHEST  Final   Special Requests NONE  Final   Gram Stain   Final    NO WBC SEEN NO SQUAMOUS EPITHELIAL CELLS SEEN NO  ORGANISMS SEEN Performed at [facility]    Culture   Final    NO GROWTH 2  DAYS Performed at [facility]    Report Status 08/30/2015 FINAL  Final  Culture, respiratory (NON-Expectorated)     Status: None   Collection Time: 09/02/15  8:30 AM  Result Value Ref Range Status   Specimen Description TRACHEAL ASPIRATE  Final   Special Requests Normal  Final   Gram Stain   Final    MODERATE WBC PRESENT,BOTH PMN AND MONONUCLEAR FEW SQUAMOUS EPITHELIAL CELLS PRESENT FEW GRAM NEGATIVE RODS Performed at [facility]    Culture   Final    MODERATE KLEBSIELLA PNEUMONIAE Performed at [facility]    Report Status 09/04/2015 FINAL  Final   Organism ID, Bacteria KLEBSIELLA PNEUMONIAE  Final      Susceptibility   Klebsiella pneumoniae - MIC*    AMPICILLIN >=32 RESISTANT Resistant     AMPICILLIN/SULBACTAM 16 INTERMEDIATE Intermediate     CEFEPIME <=1 SENSITIVE Sensitive     CEFTAZIDIME <=1 SENSITIVE Sensitive     CEFTRIAXONE <=1 SENSITIVE Sensitive     CIPROFLOXACIN <=0.25 SENSITIVE Sensitive     GENTAMICIN <=1 SENSITIVE Sensitive     IMIPENEM <=0.25 SENSITIVE Sensitive     PIP/TAZO 16 SENSITIVE Sensitive     TOBRAMYCIN <=1 SENSITIVE Sensitive     TRIMETH/SULFA Value in next row Sensitive      <=20 SENSITIVE(NOTE)    * MODERATE KLEBSIELLA PNEUMONIAE  Culture, blood (single) w Reflex to ID Panel     Status: None   Collection Time: 09/02/15 10:20 AM  Result Value Ref Range Status   Specimen Description BLOOD LEFT HAND  Final   Special Requests IN PEDIATRIC BOTTLE 1CC  Final   Culture NO GROWTH 5 DAYS  Final   Report Status 09/07/2015 FINAL  Final  Urine culture     Status: Abnormal   Collection Time: 09/02/15  5:30 PM  Result Value Ref Range Status   Specimen Description URINE, CLEAN CATCH  Final   Special Requests NONE  Final   Culture MULTIPLE SPECIES PRESENT, SUGGEST RECOLLECTION (A)  Final   Report Status 09/04/2015 FINAL  Final  C difficile quick  scan w PCR reflex     Status: None   Collection Time: 09/15/15  9:25 AM  Result Value Ref Range Status   C Diff antigen NEGATIVE NEGATIVE Final   C Diff toxin NEGATIVE NEGATIVE Final   C Diff interpretation Negative for toxigenic C. difficile  Final  Urine culture     Status: Abnormal   Collection Time: 09/17/15 11:17 AM  Result Value Ref Range Status   Specimen Description URINE, CATHETERIZED  Final   Special Requests NONE  Final   Culture >=100,000 COLONIES/mL YEAST (A)  Final   Report Status 09/18/2015 FINAL  Final  Aerobic Culture (superficial specimen) (NOT AT [facility])     Status: None   Collection Time: 09/17/15  1:24 PM  Result Value Ref Range Status   Specimen Description WOUND ABDOMEN  Final   Special Requests PATIENT ON FOLLOWING  VANCOMYCIN  Final   Gram Stain   Final    ABUNDANT WBC PRESENT,BOTH PMN AND MONONUCLEAR NO ORGANISMS SEEN    Culture NO GROWTH 2 DAYS  Final   Report Status 09/20/2015 FINAL  Final  Culture, blood (routine x 2)     Status: None   Collection Time: 09/17/15  9:29 PM  Result Value Ref Range Status   Specimen Description BLOOD BLOOD LEFT HAND  Final   Special Requests BOTTLES DRAWN AEROBIC AND ANAEROBIC 5CC  Final  Culture NO GROWTH 5 DAYS  Final   Report Status 09/22/2015 FINAL  Final  Culture, blood (routine x 2)     Status: None   Collection Time: 09/17/15  9:36 PM  Result Value Ref Range Status   Specimen Description BLOOD BLOOD LEFT HAND  Final   Special Requests BOTTLES DRAWN AEROBIC ONLY 5CC  Final   Culture NO GROWTH 5 DAYS  Final   Report Status 09/22/2015 FINAL  Final  Culture, Urine     Status: Abnormal   Collection Time: 09/21/15  9:35 AM  Result Value Ref Range Status   Specimen Description URINE, RANDOM  Final   Special Requests NONE  Final   Culture >=100,000 COLONIES/mL YEAST (A)  Final   Report Status 09/22/2015 FINAL  Final  Culture, blood (Routine X 2) w Reflex to ID Panel     Status: None   Collection Time: 09/21/15   9:40 AM  Result Value Ref Range Status   Specimen Description BLOOD BLOOD LEFT HAND  Final   Special Requests IN PEDIATRIC BOTTLE 3CC  Final   Culture NO GROWTH 5 DAYS  Final   Report Status 09/26/2015 FINAL  Final  Culture, blood (Routine X 2) w Reflex to ID Panel     Status: None   Collection Time: 09/21/15  9:50 AM  Result Value Ref Range Status   Specimen Description BLOOD BLOOD LEFT ARM  Final   Special Requests IN PEDIATRIC BOTTLE 3CC  Final   Culture NO GROWTH 5 DAYS  Final   Report Status 09/26/2015 FINAL  Final  Aerobic Culture (superficial specimen) (NOT AT [facility])     Status: None   Collection Time: 09/22/15 11:56 AM  Result Value Ref Range Status   Specimen Description ABDOMEN  Final   Special Requests Normal  Final   Gram Stain   Final    FEW WBC PRESENT,BOTH PMN AND MONONUCLEAR NO ORGANISMS SEEN    Culture NO GROWTH 2 DAYS  Final   Report Status 09/24/2015 FINAL  Final    Coagulation Studies:  Recent Labs  09/29/15 0425 09/30/15 0440 10/01/15 0410  LABPROT 31.8* 26.4* 24.0*  INR 3.16* 2.46* 2.18*    Urinalysis: No results for input(s): COLORURINE, LABSPEC, PHURINE, GLUCOSEU, HGBUR, BILIRUBINUR, KETONESUR, PROTEINUR, UROBILINOGEN, NITRITE, LEUKOCYTESUR in the last 72 hours.  Invalid input(s): APPERANCEUR    Imaging: No results found.   Medications:     . allopurinol  100 mg Oral Daily  .  ceFAZolin (ANCEF) IV  2 g Intravenous Q24H  . citalopram  20 mg Oral Daily  . docusate sodium  200 mg Oral Daily  . feeding supplement (NEPRO CARB STEADY)  237 mL Oral BID BM  . furosemide  80 mg Oral Daily  . hydrALAZINE  25 mg Oral Q8H  . metoCLOPramide  5 mg Oral Q6H  . pregabalin  75 mg Oral Daily  . rifampin  300 mg Oral Q12H  . sildenafil  20 mg Oral Q8H  . simethicone  80 mg Oral QID  . tamsulosin  0.4 mg Oral Daily  . vancomycin  125 mg Oral Q12H  . warfarin  5 mg Oral ONCE-1800  . Warfarin - Pharmacist Dosing Inpatient   Does not apply q1800    acetaminophen, Gerhardt's butt cream, guaiFENesin, ondansetron **OR** ondansetron (ZOFRAN) IV, oxyCODONE, sodium chloride flush, sorbitol, traMADol  Assessment/ Plan:  [name] is a 45 y.o. right handed male with history of systolic congestive heart failure, LVAD 12/20/2014, atrial  fibrillation maintained on Coumadin, medical noncompliance. Patient lives alone in [place]. Independent prior to admission. He has a brother in [place]. Recently traveled to [place] to see his mother and developed fever chills and abdominal pain. Went to [facility] in [place]. Initially felt to have pyelonephritis but CT scan negative. Found to have C. difficile colitis placed on oral vancomycin. Developed subxiphoid swelling and subsequent had a CT which showed large abscess surrounding driveline at LVAD site. Blood cultures showed MSSA. Seen by infectious disease started on nafcillin and transferred to [facility] for further management. Since initial infection was superficial to pump and walled off so pump exchange was not indicated. Patient underwent irrigation and debridement of chest sternal wound 09/09/2015 with closure of abdominal wound 09/18/2015 per Dr. Nils Pyle. TEE showed no vegetation on the valves were ICD wire. Presently remains on cefazolin and rifampin day 30/42 completed. Hospital course ileus and diet slowly advanced with nasogastric tube remaining in place for a short time. Bouts of nausea vomiting felt to be related to antibiotic and rifampin or change to intravenous due to nausea. He remains on twice a day vancomycin for C. difficile through 10/08/2015. Nephrology consulted 09/03/2015 for AKI elevated 1.40-3.89-7.13. Renal ultrasound negative. Hemodialysis initiated. Physical therapy evaluation completed with recommendations to physical medicine rehabilitation consult. Patient was admitted for a comprehensive rehabilitation program  1. ARF, nonoliguric.  Scr unchanged  UO excellent Not dialysis dependent will follow 2. LVAD abscess, MSSA On ancef and rifampin 3. Anemia  Check iron studies 4. Severe CHF continues on lasix  5. C diff on po vanco   LOS: 6 Boaz Berisha W @TODAY @12 :44 PM

## 2015-10-01 NOTE — Progress Notes (Signed)
Subjective/Complaints:  Patient without new issues today. Appears comfortable   ROS- negative for chest pain, shortness of breath, nausea, vomiting, diarrhea,, constipation  Objective: Vital Signs: Blood pressure 92/0, pulse 103, temperature 98.2 F (36.8 C), temperature source Oral, resp. rate 17, weight 78.3 kg (172 lb 9.9 oz), SpO2 100 %. No results found. Results for orders placed or performed during the hospital encounter of 09/25/15 (from the past 72 hour(s))  Protime-INR     Status: Abnormal   Collection Time: 09/29/15  4:25 AM  Result Value Ref Range   Prothrombin Time 31.8 (H) 11.6 - 15.2 seconds   INR 3.16 (H) 0.00 - 1.49  Renal function panel     Status: Abnormal   Collection Time: 09/29/15  4:25 AM  Result Value Ref Range   Sodium 138 135 - 145 mmol/L   Potassium 3.9 3.5 - 5.1 mmol/L   Chloride 101 101 - 111 mmol/L   CO2 25 22 - 32 mmol/L   Glucose, Bld 97 65 - 99 mg/dL   BUN 55 (H) 6 - 20 mg/dL   Creatinine, Ser 6.64 (H) 0.61 - 1.24 mg/dL   Calcium 8.8 (L) 8.9 - 10.3 mg/dL   Phosphorus 7.1 (H) 2.5 - 4.6 mg/dL   Albumin 2.0 (L) 3.5 - 5.0 g/dL   GFR calc non Af Amer 9 (L) >60 mL/min   GFR calc Af Amer 10 (L) >60 mL/min    Comment: (NOTE) The eGFR has been calculated using the CKD EPI equation. This calculation has not been validated in all clinical situations. eGFR's persistently <60 mL/min signify possible Chronic Kidney Disease.    Anion gap 12 5 - 15  CBC with Differential/Platelet     Status: Abnormal   Collection Time: 09/29/15  4:25 AM  Result Value Ref Range   WBC 10.3 4.0 - 10.5 K/uL   RBC 3.21 (L) 4.22 - 5.81 MIL/uL   Hemoglobin 8.2 (L) 13.0 - 17.0 g/dL   HCT 26.3 (L) 39.0 - 52.0 %   MCV 81.9 78.0 - 100.0 fL   MCH 25.5 (L) 26.0 - 34.0 pg   MCHC 31.2 30.0 - 36.0 g/dL   RDW 18.4 (H) 11.5 - 15.5 %   Platelets 181 150 - 400 K/uL    Comment: REPEATED TO VERIFY PLATELET COUNT CONFIRMED BY SMEAR    Neutrophils Relative % 75 %   Neutro Abs 7.8  (H) 1.7 - 7.7 K/uL   Lymphocytes Relative 15 %   Lymphs Abs 1.6 0.7 - 4.0 K/uL   Monocytes Relative 7 %   Monocytes Absolute 0.7 0.1 - 1.0 K/uL   Eosinophils Relative 2 %   Eosinophils Absolute 0.2 0.0 - 0.7 K/uL   Basophils Relative 0 %   Basophils Absolute 0.0 0.0 - 0.1 K/uL  Protime-INR     Status: Abnormal   Collection Time: 09/30/15  4:40 AM  Result Value Ref Range   Prothrombin Time 26.4 (H) 11.6 - 15.2 seconds   INR 2.46 (H) 0.00 - 1.49  Renal function panel     Status: Abnormal   Collection Time: 09/30/15  4:40 AM  Result Value Ref Range   Sodium 138 135 - 145 mmol/L   Potassium 3.8 3.5 - 5.1 mmol/L   Chloride 98 (L) 101 - 111 mmol/L   CO2 26 22 - 32 mmol/L   Glucose, Bld 85 65 - 99 mg/dL   BUN 53 (H) 6 - 20 mg/dL   Creatinine, Ser 6.42 (H) 0.61 - 1.24  mg/dL   Calcium 9.0 8.9 - 10.3 mg/dL   Phosphorus 7.8 (H) 2.5 - 4.6 mg/dL   Albumin 2.0 (L) 3.5 - 5.0 g/dL   GFR calc non Af Amer 9 (L) >60 mL/min   GFR calc Af Amer 11 (L) >60 mL/min    Comment: (NOTE) The eGFR has been calculated using the CKD EPI equation. This calculation has not been validated in all clinical situations. eGFR's persistently <60 mL/min signify possible Chronic Kidney Disease.    Anion gap 14 5 - 15  CBC with Differential/Platelet     Status: Abnormal   Collection Time: 09/30/15  4:40 AM  Result Value Ref Range   WBC 8.9 4.0 - 10.5 K/uL   RBC 3.36 (L) 4.22 - 5.81 MIL/uL   Hemoglobin 8.6 (L) 13.0 - 17.0 g/dL   HCT 27.2 (L) 39.0 - 52.0 %   MCV 81.0 78.0 - 100.0 fL   MCH 25.6 (L) 26.0 - 34.0 pg   MCHC 31.6 30.0 - 36.0 g/dL   RDW 18.4 (H) 11.5 - 15.5 %   Platelets 213 150 - 400 K/uL    Comment: PLATELET COUNT CONFIRMED BY SMEAR   Neutrophils Relative % 79 %   Neutro Abs 7.0 1.7 - 7.7 K/uL   Lymphocytes Relative 13 %   Lymphs Abs 1.2 0.7 - 4.0 K/uL   Monocytes Relative 7 %   Monocytes Absolute 0.6 0.1 - 1.0 K/uL   Eosinophils Relative 1 %   Eosinophils Absolute 0.1 0.0 - 0.7 K/uL    Basophils Relative 0 %   Basophils Absolute 0.0 0.0 - 0.1 K/uL  Protime-INR     Status: Abnormal   Collection Time: 10/01/15  4:10 AM  Result Value Ref Range   Prothrombin Time 24.0 (H) 11.6 - 15.2 seconds   INR 2.18 (H) 0.00 - 1.49  Renal function panel     Status: Abnormal   Collection Time: 10/01/15  4:10 AM  Result Value Ref Range   Sodium 135 135 - 145 mmol/L   Potassium 3.7 3.5 - 5.1 mmol/L   Chloride 95 (L) 101 - 111 mmol/L   CO2 25 22 - 32 mmol/L   Glucose, Bld 81 65 - 99 mg/dL   BUN 59 (H) 6 - 20 mg/dL   Creatinine, Ser 6.53 (H) 0.61 - 1.24 mg/dL   Calcium 9.0 8.9 - 10.3 mg/dL   Phosphorus 7.8 (H) 2.5 - 4.6 mg/dL   Albumin 2.0 (L) 3.5 - 5.0 g/dL   GFR calc non Af Amer 9 (L) >60 mL/min   GFR calc Af Amer 11 (L) >60 mL/min    Comment: (NOTE) The eGFR has been calculated using the CKD EPI equation. This calculation has not been validated in all clinical situations. eGFR's persistently <60 mL/min signify possible Chronic Kidney Disease.    Anion gap 15 5 - 15  CBC with Differential/Platelet     Status: Abnormal   Collection Time: 10/01/15  4:10 AM  Result Value Ref Range   WBC 10.3 4.0 - 10.5 K/uL    Comment: WHITE COUNT CONFIRMED ON SMEAR   RBC 3.20 (L) 4.22 - 5.81 MIL/uL   Hemoglobin 8.2 (L) 13.0 - 17.0 g/dL   HCT 26.0 (L) 39.0 - 52.0 %   MCV 81.3 78.0 - 100.0 fL   MCH 25.6 (L) 26.0 - 34.0 pg   MCHC 31.5 30.0 - 36.0 g/dL   RDW 18.8 (H) 11.5 - 15.5 %   Platelets 197 150 - 400 K/uL  Comment: PLATELET COUNT CONFIRMED BY SMEAR LARGE PLATELETS PRESENT    Neutrophils Relative % 72 %   Lymphocytes Relative 16 %   Monocytes Relative 9 %   Eosinophils Relative 2 %   Basophils Relative 1 %   Neutro Abs 7.5 1.7 - 7.7 K/uL   Lymphs Abs 1.6 0.7 - 4.0 K/uL   Monocytes Absolute 0.9 0.1 - 1.0 K/uL   Eosinophils Absolute 0.2 0.0 - 0.7 K/uL   Basophils Absolute 0.1 0.0 - 0.1 K/uL   RBC Morphology POLYCHROMASIA PRESENT     Comment: TARGET CELLS SPHEROCYTES    Smear  Review LARGE PLATELETS PRESENT      HEENT: normal Cardio: RRR and LVAD hum Resp: CTA B/L and unlabored GI: BS positive and i'll distention nontender Extremity:  Pulses positive and No Edema Skin:   Intact and Other LVAD wire site remains clean and dry Neuro: Alert/Oriented, Normal Sensory and Abnormal Motor 4/5 bilateral deltoid, biceps, triceps, grip, hip flexor, knee extensor, ankle dorsal flexor plantar flexor Musc/Skel:  Normal and Other no pain with upper extremity lower extremity range of motion  wasn't  cervical spine Gen. no acute distress   Assessment/Plan: 1. Functional deficits secondary to Debility which require 3+ hours per day of interdisciplinary therapy in a comprehensive inpatient rehab setting. Physiatrist is providing close team supervision and 24 hour management of active medical problems listed below. Physiatrist and rehab team continue to assess barriers to discharge/monitor patient progress toward functional and medical goals. FIM: Function - Bathing Bathing activity did not occur:  (Pt completed all bathing and dressing prior to sessions without assist) Position: Sitting EOB (Standing at sink except for seated at EOB to wash BLE) Body parts bathed by patient: Right arm, Left arm, Chest, Abdomen, Front perineal area, Buttocks, Right upper leg, Left upper leg, Right lower leg, Left lower leg, Back Assist Level: More than reasonable time Set up : To obtain items  Function- Upper Body Dressing/Undressing Upper body dressing/undressing activity did not occur:  (no clothes on eval) What is the patient wearing?: Pull over shirt/dress Pull over shirt/dress - Perfomed by patient: Thread/unthread right sleeve, Thread/unthread left sleeve, Put head through opening, Pull shirt over trunk Assist Level: No help, No cues Function - Lower Body Dressing/Undressing What is the patient wearing?: Underwear, Pants, Socks, Shoes Position: Sitting EOB Underwear - Performed by  patient: Thread/unthread right underwear leg, Thread/unthread left underwear leg, Pull underwear up/down Pants- Performed by patient: Thread/unthread right pants leg, Thread/unthread left pants leg, Pull pants up/down, Fasten/unfasten pants Non-skid slipper socks- Performed by patient: Don/doff right sock, Don/doff left sock Socks - Performed by patient: Don/doff right sock, Don/doff left sock Shoes - Performed by patient: Fasten left, Fasten right, Don/doff left shoe, Don/doff right shoe TED Hose - Performed by patient: Don/doff right TED hose, Don/doff left TED hose Assist for footwear: Independent Assist for lower body dressing: No Help, No cues  Function - Toileting Toileting steps completed by patient: Adjust clothing prior to toileting Toileting Assistive Devices: Grab bar or rail Assist level: More than reasonable time  Function Midwife transfer assistive device: Grab bar Assist level to toilet: No Help, No cues Assist level from toilet: No Help, no cues, assistive device, takes more than a reasonable amount of time  Function - Chair/bed transfer Chair/bed transfer assist level: No help, no cues Chair/bed transfer assistive device: Armrests Chair/bed transfer details: Verbal cues for technique, Verbal cues for sequencing  Function - Locomotion: Wheelchair Will patient use  wheelchair at discharge?: No Function - Locomotion: Ambulation Assistive device: No device Max distance: 300 Assist level: Supervision or verbal cues Assist level: No Help, No cues Assist level: Supervision or verbal cues Assist level: No Help, No cues Assist level: No Help, No cues  Function - Comprehension Comprehension: Auditory Comprehension assist level: Follows complex conversation/direction with no assist  Function - Expression Expression: Verbal Expression assist level: Expresses complex ideas: With no assist  Function - Social Interaction Social Interaction assist level:  Interacts appropriately with others - No medications needed.  Function - Problem Solving Problem solving assist level: Solves complex problems: Recognizes & self-corrects  Function - Memory Memory assist level: Complete Independence: No helper Patient normally able to recall (first 3 days only): Current season, Location of own room, Staff names and faces, That he or she is in a hospital   Medical Problem List and Plan: 1.  Debilitation secondary to C. Difficile/MSSA and abscess associated with LVAD. Status post irrigation debridement of chest sternal wound 09/09/2015-tentative plan  today however cardiology does not feel comfortable with his renal status. We can observe for 2 more days.if renal status still not improved we'll need to transfer back to cardiology  -modified independent in room 2.  DVT Prophylaxis/Anticoagulation: Coumadin therapy for history of atrial fibrillation. Monitor for any bleeding episodes, pharmacy protocol 3. Pain Management: Lyrica 75 mg daily,Ultram '50mg'$  every 6 hour, Oxycodone as needed 4. Mood: Celexa 20 mg daily 5. Neuropsych: This patient is capable of making decisions on his own behalf. 6. Skin/Wound Care: Routine skin checks 7. Fluids/Electrolytes/Nutrition:  I personally reviewed the patient's labs today.  8. ID. LVAD driveline infection/MSSA. Presently day 37/42 Cefazolin and rifampin.Follow up with infectious disease Dr. Linus Salmons 9. Clostridium difficile. Twice a day vancomycin through 10/08/2015. 10. AKI. Follow-up renal services.No pending plan for dialysis at this time. Follow-up chemistries are stable 11. Hypertension. Hydralazine 25 mg every 8 hours, Revatio 20 mg 3 times a day 12. Ileus. Resolved. Diet advance. Nausea felt to be related to antibiotics. Continue Reglan for now 13. Atrial fibrillation. Cardiac rate controlled. Continue Coumadin 14. Acute on chronic anemia. Follow-up CBC hemoglobin stable 15.Gout.asymptomatic Continue Zyloprim  LOS  (Days) 6 A FACE TO FACE EVALUATION WAS PERFORMED  Ayush Boulet E 10/01/2015, 8:29 AM

## 2015-10-01 NOTE — Progress Notes (Signed)
HeartMate 2 Rounding Note  Subjective:     Transferred to CIR 6/7.   Creatinine leveled off 6.53. Continues to make over a liter of urine.    Wants to go home     INR 2.18 MAPs 80-90s  VAD INTERROGATION:  HeartMate II LVAD: Flow 5.4  liters/min, speed 9200, power 6  PI 7  .   Objective:    Vital Signs:   BP: (92)/(0) 92/0 mmHg (06/12 1359) Last BM Date: 09/29/15 Mean arterial Pressure 80-90s   Intake/Output:   Intake/Output Summary (Last 24 hours) at 10/01/15 1154 Last data filed at 10/01/15 0800  Gross per 24 hour  Intake    600 ml  Output      0 ml  Net    600 ml    Physical Exam: General: Lying in bed. NAD HEENT: normal Neck: supple. JVP flat, Carotids 2+ bilat; no bruits. No thyromegaly or lymphadenopathy noted.  R subclavian trialysis cath dressing dry Cor: Mechanical heart sounds with LVAD hum present. Lungs: Clear, normal effort  Abdomen: soft, some tenderness, ND, no HSM. No bruits or masses. +BS  Driveline: C/D/I; securement device intact and driveline incorporated Extremities: no cyanosis, clubbing, rash, trace edema  Neuro: A & O x3  Telemetry: NSR  Labs: Basic Metabolic Panel:  Recent Labs Lab 09/27/15 0355 09/28/15 0600 09/29/15 0425 09/30/15 0440 10/01/15 0410  NA 141 140 138 138 135  K 5.0 4.1 3.9 3.8 3.7  CL 107 102 101 98* 95*  CO2 21* 25 25 26 25   GLUCOSE 73 86 97 85 81  BUN 58* 56* 55* 53* 59*  CREATININE 6.70* 6.56* 6.64* 6.42* 6.53*  CALCIUM 8.7* 8.9 8.8* 9.0 9.0  PHOS 6.8* 7.4* 7.1* 7.8* 7.8*    Liver Function Tests:  Recent Labs Lab 09/26/15 0520 09/27/15 0355 09/28/15 0600 09/29/15 0425 09/30/15 0440 10/01/15 0410  AST 74*  --   --   --   --   --   ALT 9*  --   --   --   --   --   ALKPHOS 265*  --   --   --   --   --   BILITOT 4.3*  --   --   --   --   --   PROT 6.7  --   --   --   --   --   ALBUMIN 1.8* 1.9* 1.8* 2.0* 2.0* 2.0*   No results for input(s): LIPASE, AMYLASE in the last 168 hours. No  results for input(s): AMMONIA in the last 168 hours.  CBC:  Recent Labs Lab 09/26/15 0520 09/27/15 1100 09/28/15 0600 09/29/15 0425 09/30/15 0440 10/01/15 0410  WBC 9.6 11.2* 9.2 10.3 8.9 10.3  NEUTROABS 7.4  --  6.7 7.8* 7.0 7.5  HGB 8.0* 8.9* 8.0* 8.2* 8.6* 8.2*  HCT 25.4* 28.0* 25.5* 26.3* 27.2* 26.0*  MCV 80.9 81.9 82.3 81.9 81.0 81.3  PLT 208 232 187 181 213 197    INR:  Recent Labs Lab 09/27/15 0355 09/28/15 0600 09/29/15 0425 09/30/15 0440 10/01/15 0410  INR 2.58* 3.24* 3.16* 2.46* 2.18*    Other results:    Imaging: No results found.   Medications:     Scheduled Medications: . allopurinol  100 mg Oral Daily  .  ceFAZolin (ANCEF) IV  2 g Intravenous Q24H  . citalopram  20 mg Oral Daily  . docusate sodium  200 mg Oral Daily  . feeding supplement (NEPRO CARB  STEADY)  237 mL Oral BID BM  . furosemide  80 mg Oral Daily  . hydrALAZINE  25 mg Oral Q8H  . metoCLOPramide  5 mg Oral Q6H  . pregabalin  75 mg Oral Daily  . rifampin  300 mg Oral Q12H  . sildenafil  20 mg Oral Q8H  . simethicone  80 mg Oral QID  . tamsulosin  0.4 mg Oral Daily  . vancomycin  125 mg Oral Q12H  . Warfarin - Pharmacist Dosing Inpatient   Does not apply q1800    Infusions:    PRN Medications: acetaminophen, Gerhardt's butt cream, guaiFENesin, ondansetron **OR** ondansetron (ZOFRAN) IV, oxyCODONE, sodium chloride flush, sorbitol, traMADol   Assessment:    1. LVAD Complication- Driveline abscess 2. MSSA bacteremia -> septic shock 3. Chronic systolic HF s/p VAD placement 9/16 4. C. Difficile colitis 5. 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 Acute blood loss anemia 8. Severe Malnutrition.  9. AKI - currently requiring HD.  10. Klebsiella (ESBL) pneumonia 11. Funguria   Plan/Discussion:    Urine output remains stable. Creatinine remains elevated. Renal still following.   Remains on cefazolin + rifampin for MSSA bacteremia and oral  vanc for C difficile. Klebseilla PNA has been treated. Continue Ancef, oral Vanc, and Rifampin through June 20th. Finishes fluconazole for funguria.   Maps 80-90s today. Continue hydralazine 25 mg every 8 hrs. Letting BP run a little to help renal recovery. Can increase hydralazine as needed.  Weight and volume status stable.   Dr. Prescott Gum following wound. Plan for his sister to perform dressing changes and HHRN.   VAD parameters ok.  Appreciate CIR. Discharge date 24-48 hours.  [place] set up.   I reviewed the LVAD parameters from today, and compared the results to the patient's prior recorded data.  No programming changes were made.  The LVAD is functioning within specified parameters.  The patient performs LVAD self-test daily.  LVAD interrogation was negative for any significant power changes, alarms or PI events/speed drops.  LVAD equipment check completed and is in good working order.  Back-up equipment present.   LVAD education done on emergency procedures and precautions and reviewed exit site care.  Length of Stay: 6  Amy Lannie Fields  10/01/2015, 11:54 AM  VAD Team --- VAD ISSUES ONLY--- Pager [phone] (7am - 7am)  Advanced Heart Failure Team  Pager [phone] (M-F; 7a - 4p)  Please contact [facility] for night-coverage after hours (4p -7a ) and weekends on [email or web address]   Patient seen and examined with Darrick Grinder, NP. We discussed all aspects of the encounter. I agree with the assessment and plan as stated above.   Urine output steady but creatine slow to improve. No uremia. Ab wound improving. Continues with rehab. VAD parameters stable. Hopefully we can get him home in next 24-48 hours with [facility].  [name], Daniel,MD 8:22 PM

## 2015-10-01 NOTE — Progress Notes (Signed)
  Subjective: Patient with nonhealing abdominal wound and 1 cm deep soft tissue defect We'll start packing the wound with Aquacel Home health nurses can take over at home with Monday Wednesday Friday packing with Aquacel Leave sutures in order to protect integrity of the wound  Objective: Vital signs in last 24 hours:    Hemodynamic parameters for last 24 hours:  stable Intake/Output from previous day: 06/12 0701 - 06/13 0700 In: 480 [P.O.:480] Out: 1800 [Urine:1800] Intake/Output this shift: Total I/O In: 360 [P.O.:360] Out: -        Exam    General- alert and comfortable   Lungs- clear without rales, wheezes   Cor- regular rate and rhythm, no murmur , gallop   Abdomen- soft, non-tender   Extremities - warm, non-tender, minimal edema   Neuro- oriented, appropriate, no focal weakness   Lab Results:  Recent Labs  09/30/15 0440 10/01/15 0410  WBC 8.9 10.3  HGB 8.6* 8.2*  HCT 27.2* 26.0*  PLT 213 197   BMET:  Recent Labs  09/30/15 0440 10/01/15 0410  NA 138 135  K 3.8 3.7  CL 98* 95*  CO2 26 25  GLUCOSE 85 81  BUN 53* 59*  CREATININE 6.42* 6.53*  CALCIUM 9.0 9.0    PT/INR:  Recent Labs  10/01/15 0410  LABPROT 24.0*  INR 2.18*   ABG    Component Value Date/Time   PHART 7.332* 09/09/2015 1145   HCO3 20.2 09/09/2015 1145   TCO2 26 09/16/2015 2318   ACIDBASEDEF 5.0* 09/09/2015 1145   O2SAT 70.2 09/25/2015 0515   CBG (last 3)  No results for input(s): GLUCAP in the last 72 hours.  Assessment/Plan: S/P   Continue daily wound care while in hospital Follow-up weekly after discharge   LOS: 6 days    [name] 10/01/2015

## 2015-10-01 NOTE — Consult Note (Addendum)
  INITIAL DIAGNOSTIC EVALUATION - [facility]FIDENTIAL [facility]   MEDICAL NECESSITY:  [name] was seen on the [facility] for an initial diagnostic evaluation owing to the patient's diagnosis of wound infection.   Records indicate that [name] is a "45 y.o. right handed male with history of systolic congestive heart failure, LVAD 12/20/2014, atrial fibrillation maintained on Coumadin, medical noncompliance. Patient lives alone in [place]. Independent prior to admission. He has a brother in [place]. Recently traveled to [place] to see his mother and developed fever chills and abdominal pain. Went to [facility] in [place]. Initially felt to have pyelonephritis but CT scan negative. Found to have C. difficile colitis placed on oral vancomycin. Developed subxiphoid swelling and subsequent had a CT which showed large abscess surrounding driveline at LVAD site. Blood cultures showed MSSA. Seen by infectious disease started on nafcillin and transferred to [facility] for further management."  During today's visit, [name] denied experiencing any cognitive changes for any reason. He was purportedly hospitalized for multiple medical reasons and subsequent complications ranging from fever and kidney stones and infection. He was attending his mother's funeral at the time.   From an emotional standpoint, [name] denied ever being diagnosed with or treated for a mental health condition. He also denied ever experiencing any prolonged periods of depression or anxiety throughout his life. He is understandably grieving the loss of his mother but he mentioned that he is handling it okay and described his current mood as "good." He also appears to be adjusting well to this admission but he is excited about discharging soon. Suicidal/homicidal ideation, plan or intent was denied. No manic or hypomanic episodes were  reported. The patient denied ever experiencing any auditory/visual hallucinations. No major behavioral or personality changes were endorsed.   [name] feels that he has make progress in therapy. No barriers identified. He described the rehab staff as "great." He presently lives alone but has his family and church family to support him. He will also have family members to help him with his transition home.   PROCEDURES: [1 unit 90791] Diagnostic clinical interview  Review of available records   IMPRESSION: Overall, [name] denied experiencing any adverse cognitive or emotional difficulties. However, he is understandably still grieving the loss of his mother. I recommended that he consider participation in grief counseling and he was agreeable. I will ask that his social worker provide him with resources in his area. I do not feel that neuropsychology needs to follow-up with the patient for the remainder of this admission unless requested by the patient or staff.     Rutha Bouchard, Psy.D., ABN  Board-Certified Clinical Neuropsychologist

## 2015-10-01 NOTE — Progress Notes (Signed)
Social Work Patient ID: [name], male   DOB: 1970-10-12, 45 y.o.   MRN: [id number] MD reports pt not medically ready for discharge today, Cardiology wants to monitor a few more days. Made team aware of plan. Team has discharged him form therapies due to meeting his goals.

## 2015-10-02 ENCOUNTER — Inpatient Hospital Stay (HOSPITAL_COMMUNITY): Payer: 59

## 2015-10-02 LAB — RENAL FUNCTION PANEL
ALBUMIN: 2.1 g/dL — AB (ref 3.5–5.0)
Anion gap: 14 (ref 5–15)
BUN: 60 mg/dL — AB (ref 6–20)
CHLORIDE: 94 mmol/L — AB (ref 101–111)
CO2: 25 mmol/L (ref 22–32)
CREATININE: 6.49 mg/dL — AB (ref 0.61–1.24)
Calcium: 8.9 mg/dL (ref 8.9–10.3)
GFR, EST AFRICAN AMERICAN: 11 mL/min — AB (ref >60)
GFR, EST NON AFRICAN AMERICAN: 9 mL/min — AB (ref >60)
Glucose, Bld: 110 mg/dL — ABNORMAL HIGH (ref 65–99)
PHOSPHORUS: 8 mg/dL — AB (ref 2.5–4.6)
POTASSIUM: 3.5 mmol/L (ref 3.5–5.1)
Sodium: 133 mmol/L — ABNORMAL LOW (ref 135–145)

## 2015-10-02 LAB — CBC WITH DIFFERENTIAL/PLATELET
BASOS ABS: 0 10*3/uL (ref 0.0–0.1)
Basophils Relative: 0 %
EOS ABS: 0.1 10*3/uL (ref 0.0–0.7)
EOS PCT: 1 %
HCT: 28.2 % — ABNORMAL LOW (ref 39.0–52.0)
HEMOGLOBIN: 9 g/dL — AB (ref 13.0–17.0)
LYMPHS ABS: 1.4 10*3/uL (ref 0.7–4.0)
Lymphocytes Relative: 11 %
MCH: 25.3 pg — AB (ref 26.0–34.0)
MCHC: 31.9 g/dL (ref 30.0–36.0)
MCV: 79.2 fL (ref 78.0–100.0)
Monocytes Absolute: 0.8 10*3/uL (ref 0.1–1.0)
Monocytes Relative: 6 %
NEUTROS PCT: 82 %
Neutro Abs: 10 10*3/uL — ABNORMAL HIGH (ref 1.7–7.7)
PLATELETS: 223 10*3/uL (ref 150–400)
RBC: 3.56 MIL/uL — AB (ref 4.22–5.81)
RDW: 18.5 % — ABNORMAL HIGH (ref 11.5–15.5)
WBC: 12.2 10*3/uL — AB (ref 4.0–10.5)

## 2015-10-02 LAB — PROTIME-INR
INR: 2.52 — ABNORMAL HIGH (ref 0.00–1.49)
Prothrombin Time: 26.9 seconds — ABNORMAL HIGH (ref 11.6–15.2)

## 2015-10-02 MED ORDER — PREGABALIN 75 MG PO CAPS: 75.0000 mg | ORAL_CAPSULE | Freq: Every day | ORAL | Status: DC

## 2015-10-02 MED ORDER — SILDENAFIL CITRATE 20 MG PO TABS: 20.0000 mg | ORAL_TABLET | Freq: Three times a day (TID) | ORAL | Status: DC

## 2015-10-02 MED ORDER — FUROSEMIDE 80 MG PO TABS: 80.0000 mg | ORAL_TABLET | Freq: Every day | ORAL | Status: DC

## 2015-10-02 MED ORDER — ALLOPURINOL 100 MG PO TABS: 100.0000 mg | ORAL_TABLET | Freq: Every day | ORAL | Status: DC

## 2015-10-02 MED ORDER — RIFAMPIN 300 MG PO CAPS: 300.0000 mg | ORAL_CAPSULE | Freq: Two times a day (BID) | ORAL | Status: DC

## 2015-10-02 MED ORDER — SIMETHICONE 80 MG PO CHEW: 80.0000 mg | CHEWABLE_TABLET | Freq: Four times a day (QID) | ORAL | Status: DC

## 2015-10-02 MED ORDER — METOCLOPRAMIDE HCL 5 MG PO TABS: 5.0000 mg | ORAL_TABLET | Freq: Four times a day (QID) | ORAL | Status: DC

## 2015-10-02 MED ORDER — CITALOPRAM HYDROBROMIDE 20 MG PO TABS: 20.0000 mg | ORAL_TABLET | Freq: Every day | ORAL | Status: DC

## 2015-10-02 MED ORDER — VANCOMYCIN 50 MG/ML ORAL SOLUTION: ORAL | Status: DC

## 2015-10-02 MED ORDER — OXYCODONE HCL 5 MG PO TABS
5.0000 mg | ORAL_TABLET | ORAL | Status: DC | PRN
Start: 2015-10-02 — End: 2016-07-01

## 2015-10-02 MED ORDER — WARFARIN SODIUM 4 MG PO TABS
4.0000 mg | ORAL_TABLET | Freq: Every day | ORAL | Status: DC
  Filled 2015-10-02: qty 1

## 2015-10-02 MED ORDER — TAMSULOSIN HCL 0.4 MG PO CAPS: 0.4000 mg | ORAL_CAPSULE | Freq: Every day | ORAL | Status: DC

## 2015-10-02 MED ORDER — WARFARIN SODIUM 4 MG PO TABS: 4.0000 mg | ORAL_TABLET | Freq: Once | ORAL | Status: DC

## 2015-10-02 NOTE — Progress Notes (Signed)
Social Work Patient ID: [name], male   DOB: 08-15-1970, 45 y.o.   MRN: [id number] MD's feel pt is medically stable for discharge today. Have contacted Pam-IV with [facility] to inform her. Will get IV antibiotics in place and prepare for discharge today.

## 2015-10-02 NOTE — Progress Notes (Signed)
Social Work  Discharge Note  The overall goal for the admission was met for:   Discharge location: [place]  Length of Stay: Yes-7 DAYS  Discharge activity level: Yes-MOD/I LEVEL  Home/community participation: Yes  Services provided included: MD, RD, PT, OT, RN, CM, TR, Pharmacy, [facility]: Private Insurance: [facility]  Follow-up services arranged: Home Health: [facility]-PT & RN and Patient/Family request agency HH: HAD BEFORE PREF, DME: NO NEEDS  Comments (or additional information):PT DID WELL HERE ONLY A FEW MORE DAYS OF IV ANTIBIOTICS, WILL HAVE [place], HERE EXTRA DAY DUE TO MEDICAL ISSUES  Patient/Family verbalized understanding of follow-up arrangements: Yes  Individual responsible for coordination of the follow-up plan: SELF & PAMELA-SISTER  Confirmed correct DME delivered: Elease Hashimoto 10/02/2015    Wadie Liew, Gardiner Rhyme

## 2015-10-02 NOTE — Progress Notes (Signed)
Right IJ HDC removed per order, vaseline and 4x4 gauze pressure dressing applied with manual pressure held x 30 minutes.  No bleeding post removal.  Patient instructed to lie in bed for 30 minutes post removal and call if assistance needed.  Instructed to leave dressing on for 48 hours, do not get wet and to hold pressure if bleeding occurs, return to ER if unable to stop bleeding.  Patient verbalized understanding.  Requested bedside nurse check dressing again in 10 minutes.  Carolee Rota, RN VAST

## 2015-10-02 NOTE — Progress Notes (Signed)
[name]   Subjective:   Interval History:  No complaints today  Objective:  Vital signs in last 24 hours:  Weight:  [76 kg (167 lb 8.8 oz)] 76 kg (167 lb 8.8 oz) (06/14 0600)  Weight change:  Filed Weights   09/26/15 0500 09/27/15 0505 10/02/15 0600  Weight: 78.1 kg (172 lb 2.9 oz) 78.3 kg (172 lb 9.9 oz) 76 kg (167 lb 8.8 oz)    Intake/Output: I/O last 3 completed shifts: In: 840 [P.O.:840] Out: -    Intake/Output this shift:  Total I/O In: 240 [P.O.:240] Out: -   General: Lying in bed. NAD HEENT: normal Neck: supple. JVP flat, Carotids 2+ bilat; no bruits. No thyromegaly or lymphadenopathy noted.  R subclavian trialysis cath dressing dry Cor: Mechanical heart sounds with LVAD hum present. Lungs: Clear, normal effort  Abdomen: soft, some tenderness, ND, no HSM. No bruits or masses. +BS  Driveline: C/D/I; securement device intact and driveline incorporated Extremities: no cyanosis, clubbing, rash, trace edema  Neuro: A & O x3   Basic Metabolic Panel:  Recent Labs Lab 09/28/15 0600 09/29/15 0425 09/30/15 0440 10/01/15 0410 10/02/15 0515  NA 140 138 138 135 133*  K 4.1 3.9 3.8 3.7 3.5  CL 102 101 98* 95* 94*  CO2 25 25 26 25 25   GLUCOSE 86 97 85 81 110*  BUN 56* 55* 53* 59* 60*  CREATININE 6.56* 6.64* 6.42* 6.53* 6.49*  CALCIUM 8.9 8.8* 9.0 9.0 8.9  PHOS 7.4* 7.1* 7.8* 7.8* 8.0*    Liver Function Tests:  Recent Labs Lab 09/26/15 0520  09/28/15 0600 09/29/15 0425 09/30/15 0440 10/01/15 0410 10/02/15 0515  AST 74*  --   --   --   --   --   --   ALT 9*  --   --   --   --   --   --   ALKPHOS 265*  --   --   --   --   --   --   BILITOT 4.3*  --   --   --   --   --   --   PROT 6.7  --   --   --   --   --   --   ALBUMIN 1.8*  < > 1.8* 2.0* 2.0* 2.0* 2.1*  < > = values in this interval not displayed. No results for input(s): LIPASE, AMYLASE in the last 168 hours. No results for input(s): AMMONIA in the last 168  hours.  CBC:  Recent Labs Lab 09/28/15 0600 09/29/15 0425 09/30/15 0440 10/01/15 0410 10/02/15 0500  WBC 9.2 10.3 8.9 10.3 12.2*  NEUTROABS 6.7 7.8* 7.0 7.5 10.0*  HGB 8.0* 8.2* 8.6* 8.2* 9.0*  HCT 25.5* 26.3* 27.2* 26.0* 28.2*  MCV 82.3 81.9 81.0 81.3 79.2  PLT 187 181 213 197 223    Cardiac Enzymes: No results for input(s): CKTOTAL, CKMB, CKMBINDEX, TROPONINI in the last 168 hours.  BNP: Invalid input(s): POCBNP  CBG:  Recent Labs Lab 09/25/15 1222 09/25/15 1610 09/26/15 0708 09/26/15 1154 09/27/15 0648  GLUCAP 74 89 89 88 83    Microbiology: Results for orders placed or performed during the hospital encounter of 08/26/15  MRSA PCR Screening     Status: None   Collection Time: 08/26/15 10:00 PM  Result Value Ref Range Status   MRSA by PCR NEGATIVE NEGATIVE Final    Comment:        The GeneXpert MRSA Assay (  FDA approved for NASAL specimens only), is one component of a comprehensive MRSA colonization surveillance program. It is not intended to diagnose MRSA infection nor to guide or monitor treatment for MRSA infections.   Stat Gram stain     Status: None   Collection Time: 08/27/15  7:30 AM  Result Value Ref Range Status   Specimen Description ABSCESS ABDOMEN  Final   Special Requests NONE  Final   Gram Stain   Final    MODERATE WBC PRESENT,BOTH PMN AND MONONUCLEAR NO ORGANISMS SEEN    Report Status 08/27/2015 FINAL  Final  Culture, routine-abscess     Status: None   Collection Time: 08/27/15  7:30 AM  Result Value Ref Range Status   Specimen Description ABSCESS ABDOMEN  Final   Special Requests NONE  Final   Gram Stain   Final    MODERATE WBC PRESENT,BOTH PMN AND MONONUCLEAR NO SQUAMOUS EPITHELIAL CELLS SEEN NO ORGANISMS SEEN Performed at [facility] Performed at [facility]    Culture   Final    FEW STAPHYLOCOCCUS AUREUS [name]: RIFAMPIN AND GENTAMICIN SHOULD NOT BE USED AS SINGLE DRUGS FOR TREATMENT OF STAPH INFECTIONS.  This organism DOES NOT demonstrate inducible Clindamycin resistance in vitro. Performed at [facility]    Report Status 08/30/2015 FINAL  Final   Organism ID, Bacteria STAPHYLOCOCCUS AUREUS  Final      Susceptibility   Staphylococcus aureus - MIC*    CLINDAMYCIN <=0.25 SENSITIVE Sensitive     ERYTHROMYCIN >=8 RESISTANT Resistant     GENTAMICIN <=0.5 SENSITIVE Sensitive     LEVOFLOXACIN 0.25 SENSITIVE Sensitive     OXACILLIN 0.5 SENSITIVE Sensitive     RIFAMPIN <=0.5 SENSITIVE Sensitive     TRIMETH/SULFA <=10 SENSITIVE Sensitive     VANCOMYCIN 1 SENSITIVE Sensitive     TETRACYCLINE <=1 SENSITIVE Sensitive     MOXIFLOXACIN <=0.25 SENSITIVE Sensitive     * FEW STAPHYLOCOCCUS AUREUS  Culture, blood (Routine X 2) w Reflex to ID Panel     Status: None   Collection Time: 08/27/15 10:38 AM  Result Value Ref Range Status   Specimen Description BLOOD RIGHT HAND  Final   Special Requests IN PEDIATRIC BOTTLE 3CC  Final   Culture NO GROWTH 5 DAYS  Final   Report Status 09/01/2015 FINAL  Final  Culture, blood (Routine X 2) w Reflex to ID Panel     Status: None   Collection Time: 08/27/15 10:42 AM  Result Value Ref Range Status   Specimen Description BLOOD LEFT HAND  Final   Special Requests IN PEDIATRIC BOTTLE 3CC  Final   Culture NO GROWTH 5 DAYS  Final   Report Status 09/01/2015 FINAL  Final  Anaerobic culture     Status: None   Collection Time: 08/27/15  5:48 PM  Result Value Ref Range Status   Specimen Description WOUND CHEST  Final   Special Requests NONE  Final   Gram Stain   Final    NO WBC SEEN NO SQUAMOUS EPITHELIAL CELLS SEEN NO ORGANISMS SEEN Performed at [facility]    Culture   Final    NO ANAEROBES ISOLATED Performed at [facility]    Report Status 09/01/2015 FINAL  Final  Wound culture     Status: None   Collection Time: 08/27/15  5:48 PM  Result Value Ref Range Status   Specimen Description WOUND CHEST  Final   Special Requests NONE   Final   Gram  Stain   Final    NO WBC SEEN NO SQUAMOUS EPITHELIAL CELLS SEEN NO ORGANISMS SEEN Performed at [facility]    Culture   Final    NO GROWTH 2 DAYS Performed at [facility]    Report Status 08/30/2015 FINAL  Final  Culture, respiratory (NON-Expectorated)     Status: None   Collection Time: 09/02/15  8:30 AM  Result Value Ref Range Status   Specimen Description TRACHEAL ASPIRATE  Final   Special Requests Normal  Final   Gram Stain   Final    MODERATE WBC PRESENT,BOTH PMN AND MONONUCLEAR FEW SQUAMOUS EPITHELIAL CELLS PRESENT FEW GRAM NEGATIVE RODS Performed at [facility]    Culture   Final    MODERATE KLEBSIELLA PNEUMONIAE Performed at [facility]    Report Status 09/04/2015 FINAL  Final   Organism ID, Bacteria KLEBSIELLA PNEUMONIAE  Final      Susceptibility   Klebsiella pneumoniae - MIC*    AMPICILLIN >=32 RESISTANT Resistant     AMPICILLIN/SULBACTAM 16 INTERMEDIATE Intermediate     CEFEPIME <=1 SENSITIVE Sensitive     CEFTAZIDIME <=1 SENSITIVE Sensitive     CEFTRIAXONE <=1 SENSITIVE Sensitive     CIPROFLOXACIN <=0.25 SENSITIVE Sensitive     GENTAMICIN <=1 SENSITIVE Sensitive     IMIPENEM <=0.25 SENSITIVE Sensitive     PIP/TAZO 16 SENSITIVE Sensitive     TOBRAMYCIN <=1 SENSITIVE Sensitive     TRIMETH/SULFA Value in next row Sensitive      <=20 SENSITIVE([name])    * MODERATE KLEBSIELLA PNEUMONIAE  Culture, blood (single) w Reflex to ID Panel     Status: None   Collection Time: 09/02/15 10:20 AM  Result Value Ref Range Status   Specimen Description BLOOD LEFT HAND  Final   Special Requests IN PEDIATRIC BOTTLE 1CC  Final   Culture NO GROWTH 5 DAYS  Final   Report Status 09/07/2015 FINAL  Final  Urine culture     Status: Abnormal   Collection Time: 09/02/15  5:30 PM  Result Value Ref Range Status   Specimen Description URINE, CLEAN CATCH  Final   Special Requests NONE  Final   Culture MULTIPLE SPECIES PRESENT, SUGGEST  RECOLLECTION (A)  Final   Report Status 09/04/2015 FINAL  Final  C difficile quick scan w PCR reflex     Status: None   Collection Time: 09/15/15  9:25 AM  Result Value Ref Range Status   C Diff antigen NEGATIVE NEGATIVE Final   C Diff toxin NEGATIVE NEGATIVE Final   C Diff interpretation Negative for toxigenic C. difficile  Final  Urine culture     Status: Abnormal   Collection Time: 09/17/15 11:17 AM  Result Value Ref Range Status   Specimen Description URINE, CATHETERIZED  Final   Special Requests NONE  Final   Culture >=100,000 COLONIES/mL YEAST (A)  Final   Report Status 09/18/2015 FINAL  Final  Aerobic Culture (superficial specimen) (NOT AT [facility])     Status: None   Collection Time: 09/17/15  1:24 PM  Result Value Ref Range Status   Specimen Description WOUND ABDOMEN  Final   Special Requests PATIENT ON FOLLOWING  VANCOMYCIN  Final   Gram Stain   Final    ABUNDANT WBC PRESENT,BOTH PMN AND MONONUCLEAR NO ORGANISMS SEEN    Culture NO GROWTH 2 DAYS  Final   Report Status 09/20/2015 FINAL  Final  Culture, blood (routine x 2)     Status: None  Collection Time: 09/17/15  9:29 PM  Result Value Ref Range Status   Specimen Description BLOOD BLOOD LEFT HAND  Final   Special Requests BOTTLES DRAWN AEROBIC AND ANAEROBIC 5CC  Final   Culture NO GROWTH 5 DAYS  Final   Report Status 09/22/2015 FINAL  Final  Culture, blood (routine x 2)     Status: None   Collection Time: 09/17/15  9:36 PM  Result Value Ref Range Status   Specimen Description BLOOD BLOOD LEFT HAND  Final   Special Requests BOTTLES DRAWN AEROBIC ONLY 5CC  Final   Culture NO GROWTH 5 DAYS  Final   Report Status 09/22/2015 FINAL  Final  Culture, Urine     Status: Abnormal   Collection Time: 09/21/15  9:35 AM  Result Value Ref Range Status   Specimen Description URINE, RANDOM  Final   Special Requests NONE  Final   Culture >=100,000 COLONIES/mL YEAST (A)  Final   Report Status 09/22/2015 FINAL  Final  Culture, blood  (Routine X 2) w Reflex to ID Panel     Status: None   Collection Time: 09/21/15  9:40 AM  Result Value Ref Range Status   Specimen Description BLOOD BLOOD LEFT HAND  Final   Special Requests IN PEDIATRIC BOTTLE 3CC  Final   Culture NO GROWTH 5 DAYS  Final   Report Status 09/26/2015 FINAL  Final  Culture, blood (Routine X 2) w Reflex to ID Panel     Status: None   Collection Time: 09/21/15  9:50 AM  Result Value Ref Range Status   Specimen Description BLOOD BLOOD LEFT ARM  Final   Special Requests IN PEDIATRIC BOTTLE 3CC  Final   Culture NO GROWTH 5 DAYS  Final   Report Status 09/26/2015 FINAL  Final  Aerobic Culture (superficial specimen) (NOT AT [facility])     Status: None   Collection Time: 09/22/15 11:56 AM  Result Value Ref Range Status   Specimen Description ABDOMEN  Final   Special Requests Normal  Final   Gram Stain   Final    FEW WBC PRESENT,BOTH PMN AND MONONUCLEAR NO ORGANISMS SEEN    Culture NO GROWTH 2 DAYS  Final   Report Status 09/24/2015 FINAL  Final    Coagulation Studies:  Recent Labs  09/30/15 0440 10/01/15 0410 10/02/15 0500  LABPROT 26.4* 24.0* 26.9*  INR 2.46* 2.18* 2.52*    Urinalysis: No results for input(s): COLORURINE, LABSPEC, PHURINE, GLUCOSEU, HGBUR, BILIRUBINUR, KETONESUR, PROTEINUR, UROBILINOGEN, NITRITE, LEUKOCYTESUR in the last 72 hours.  Invalid input(s): APPERANCEUR    Imaging: No results found.   Medications:     . allopurinol  100 mg Oral Daily  .  ceFAZolin (ANCEF) IV  2 g Intravenous Q24H  . citalopram  20 mg Oral Daily  . docusate sodium  200 mg Oral Daily  . feeding supplement (NEPRO CARB STEADY)  237 mL Oral BID BM  . furosemide  80 mg Oral Daily  . hydrALAZINE  25 mg Oral Q8H  . metoCLOPramide  5 mg Oral Q6H  . pregabalin  75 mg Oral Daily  . rifampin  300 mg Oral Q12H  . sildenafil  20 mg Oral Q8H  . simethicone  80 mg Oral QID  . tamsulosin  0.4 mg Oral Daily  . vancomycin  125 mg Oral Q12H  . warfarin  4 mg Oral  q1800  . Warfarin - Pharmacist Dosing Inpatient   Does not apply q1800   acetaminophen, Gerhardt's butt cream,  guaiFENesin, ondansetron **OR** ondansetron (ZOFRAN) IV, oxyCODONE, sodium chloride flush, sorbitol, traMADol  Assessment/ Plan:  [name] is a 45 y.o. right handed male with history of systolic congestive heart failure, LVAD 12/20/2014, atrial fibrillation maintained on Coumadin, medical noncompliance. Patient lives alone in [place]. Independent prior to admission. He has a brother in [place]. Recently traveled to [place] to see his mother and developed fever chills and abdominal pain. Went to [facility] in [place]. Initially felt to have pyelonephritis but CT scan negative. Found to have C. difficile colitis placed on oral vancomycin. Developed subxiphoid swelling and subsequent had a CT which showed large abscess surrounding driveline at LVAD site. Blood cultures showed MSSA. Seen by infectious disease started on nafcillin and transferred to [facility] for further management. Since initial infection was superficial to pump and walled off so pump exchange was not indicated. Patient underwent irrigation and debridement of chest sternal wound 09/09/2015 with closure of abdominal wound 09/18/2015 per Dr. Nils Pyle. TEE showed no vegetation on the valves were ICD wire. Presently remains on cefazolin and rifampin day 30/42 completed. Hospital course ileus and diet slowly advanced with nasogastric tube remaining in place for a short time. Bouts of nausea vomiting felt to be related to antibiotic and rifampin or change to intravenous due to nausea. He remains on twice a day vancomycin for C. difficile through 10/08/2015. Nephrology consulted 09/03/2015 for AKI elevated 1.40-3.89-7.13. Renal ultrasound negative. Hemodialysis initiated. Physical therapy evaluation completed with recommendations to physical medicine rehabilitation consult. Patient was  admitted for a comprehensive rehabilitation program  1. ARF, nonoliguric. Scr unchanged  UO excellent  Baseline 1.4  Will  Continue to follow   Hopefully should recover 2. LVAD abscess, MSSA On ancef and rifampin 3. Anemia Check iron studies 4. Severe CHF continues on lasix  5. C diff on po vanco   Will sign off today and can schedule follow up in 1 month at [place]    LOS: 7 Peng Thorstenson W @TODAY @10 :47 AM

## 2015-10-02 NOTE — Progress Notes (Signed)
ANTICOAGULATION CONSULT NOTE   Pharmacy Consult for Coumadin Indication: LVAD   Labs:  Recent Labs  09/30/15 0440 10/01/15 0410 10/02/15 0500 10/02/15 0515  HGB 8.6* 8.2* 9.0*  --   HCT 27.2* 26.0* 28.2*  --   PLT 213 197 223  --   LABPROT 26.4* 24.0* 26.9*  --   INR 2.46* 2.18* 2.52*  --   CREATININE 6.42* 6.53*  --  6.49*    Estimated Creatinine Clearance: 13.2 mL/min (by C-G formula based on Cr of 6.49).   Assessment: 45 yo male with LVAD on chronic Coumadin.  Pharmacy now dosing at [place] request.  Patient receiving ancef and rifampin for driveline infection and MSSA bacteremia. Anticipate difficulty keeping INR in range given drug interactions.  INR trending up this am to 2.5 this am.  Fluconazole therapy has stopped  Rifampin remains on board CBC stable No bleeding noted.   Will follow warfarin requirements closely.  Goal of Therapy:  INR 2-2.5 Monitor platelets by anticoagulation protocol: Yes   Plan:  Reduce Warfarin to 4mg  daily for now and have close INR follow up given rifampin Daily PT/INR  Erin Hearing PharmD., BCPS Clinical Pharmacist Pager [phone] 10/02/2015 9:14 AM

## 2015-10-02 NOTE — Progress Notes (Signed)
HeartMate 2 Rounding Note  Subjective:     Transferred to CIR 6/7.   Creatinine leveled off 6.53. Continues to make over a liter of urine.    Wants to go home     INR 2.52 MAPs 80-90s  VAD INTERROGATION:  HeartMate II LVAD: Flow 3.6  liters/min, speed 9200, power 5  PI 6.9 .   Objective:    Vital Signs:   Weight:  [167 lb 8.8 oz (76 kg)] 167 lb 8.8 oz (76 kg) (06/14 0600) Last BM Date: 10/01/15 Mean arterial Pressure 80-90s   Intake/Output:   Intake/Output Summary (Last 24 hours) at 10/02/15 0914 Last data filed at 10/02/15 0831  Gross per 24 hour  Intake    720 ml  Output      0 ml  Net    720 ml    Physical Exam: General: Lying in bed. NAD HEENT: normal Neck: supple. JVP flat, Carotids 2+ bilat; no bruits. No thyromegaly or lymphadenopathy noted.  R subclavian trialysis cath dressing dry Cor: Mechanical heart sounds with LVAD hum present. Lungs: Clear, normal effort  Abdomen: soft, some tenderness, ND, no HSM. No bruits or masses. +BS  Driveline: C/D/I; securement device intact and driveline incorporated Extremities: no cyanosis, clubbing, rash, trace edema  Neuro: A & O x3  Telemetry: NSR  Labs: Basic Metabolic Panel:  Recent Labs Lab 09/28/15 0600 09/29/15 0425 09/30/15 0440 10/01/15 0410 10/02/15 0515  NA 140 138 138 135 133*  K 4.1 3.9 3.8 3.7 3.5  CL 102 101 98* 95* 94*  CO2 25 25 26 25 25   GLUCOSE 86 97 85 81 110*  BUN 56* 55* 53* 59* 60*  CREATININE 6.56* 6.64* 6.42* 6.53* 6.49*  CALCIUM 8.9 8.8* 9.0 9.0 8.9  PHOS 7.4* 7.1* 7.8* 7.8* 8.0*    Liver Function Tests:  Recent Labs Lab 09/26/15 0520  09/28/15 0600 09/29/15 0425 09/30/15 0440 10/01/15 0410 10/02/15 0515  AST 74*  --   --   --   --   --   --   ALT 9*  --   --   --   --   --   --   ALKPHOS 265*  --   --   --   --   --   --   BILITOT 4.3*  --   --   --   --   --   --   PROT 6.7  --   --   --   --   --   --   ALBUMIN 1.8*  < > 1.8* 2.0* 2.0* 2.0* 2.1*  < > =  values in this interval not displayed. No results for input(s): LIPASE, AMYLASE in the last 168 hours. No results for input(s): AMMONIA in the last 168 hours.  CBC:  Recent Labs Lab 09/28/15 0600 09/29/15 0425 09/30/15 0440 10/01/15 0410 10/02/15 0500  WBC 9.2 10.3 8.9 10.3 12.2*  NEUTROABS 6.7 7.8* 7.0 7.5 10.0*  HGB 8.0* 8.2* 8.6* 8.2* 9.0*  HCT 25.5* 26.3* 27.2* 26.0* 28.2*  MCV 82.3 81.9 81.0 81.3 79.2  PLT 187 181 213 197 223    INR:  Recent Labs Lab 09/28/15 0600 09/29/15 0425 09/30/15 0440 10/01/15 0410 10/02/15 0500  INR 3.24* 3.16* 2.46* 2.18* 2.52*    Other results:    Imaging: No results found.   Medications:     Scheduled Medications: . allopurinol  100 mg Oral Daily  .  ceFAZolin (ANCEF) IV  2  g Intravenous Q24H  . citalopram  20 mg Oral Daily  . docusate sodium  200 mg Oral Daily  . feeding supplement (NEPRO CARB STEADY)  237 mL Oral BID BM  . furosemide  80 mg Oral Daily  . hydrALAZINE  25 mg Oral Q8H  . metoCLOPramide  5 mg Oral Q6H  . pregabalin  75 mg Oral Daily  . rifampin  300 mg Oral Q12H  . sildenafil  20 mg Oral Q8H  . simethicone  80 mg Oral QID  . tamsulosin  0.4 mg Oral Daily  . vancomycin  125 mg Oral Q12H  . Warfarin - Pharmacist Dosing Inpatient   Does not apply q1800    Infusions:    PRN Medications: acetaminophen, Gerhardt's butt cream, guaiFENesin, ondansetron **OR** ondansetron (ZOFRAN) IV, oxyCODONE, sodium chloride flush, sorbitol, traMADol   Assessment:    1. LVAD Complication- Driveline abscess 2. MSSA bacteremia -> septic shock 3. Chronic systolic HF s/p VAD placement 9/16 4. C. Difficile colitis 5. 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 Acute blood loss anemia 8. Severe Malnutrition.  9. AKI - currently requiring HD.  10. Klebsiella (ESBL) pneumonia 11. Funguria   Plan/Discussion:     Creatinine 6.4. Continues to make urine. Renal still following.   Remains on  cefazolin + rifampin for MSSA bacteremia and oral vanc for C difficile. Klebseilla PNA has been treated. Continue Ancef, oral Vanc, and Rifampin through June 20th. Finishes fluconazole for funguria.   Maps 80-90s today. Continue hydralazine 25 mg every 8 hrs. Letting BP run a little to help renal recovery.  Weight and volume status stable.   Dr. Prescott Gum following wound. Plan for [facility] to perform dressing changes M-W-F with aquacel Ag.    VAD parameters ok.  Appreciate CIR.  [place] set up.   I reviewed the LVAD parameters from today, and compared the results to the patient's prior recorded data.  No programming changes were made.  The LVAD is functioning within specified parameters.  The patient performs LVAD self-test daily.  LVAD interrogation was negative for any significant power changes, alarms or PI events/speed drops.  LVAD equipment check completed and is in good working order.  Back-up equipment present.   LVAD education done on emergency procedures and precautions and reviewed exit site care.  Length of Stay: [place], NP-C   10/02/2015, 9:14 AM  VAD Team --- VAD ISSUES ONLY--- Pager [phone] (7am - 7am)  Advanced Heart Failure Team  Pager [phone] (M-F; 7a - 4p)  Please contact [facility] for night-coverage after hours (4p -7a ) and weekends on [email or web address]  Patient seen and examined with Darrick Grinder, NP. We discussed all aspects of the encounter. I agree with the assessment and plan as stated above.   He is improved. Renal function stable. Making good urine. VAD parameters ok. Wound healing. Can go home today. Follow closely in [facility].   [name], Daniel,MD 3:55 PM

## 2015-10-02 NOTE — Progress Notes (Signed)
Subjective/Complaints:  [place] [facility] cardiology, thoracic surgery, and nephrology notes. Patient feels okay today. He is up in his room at a modified independent level.  ROS- negative for chest pain, shortness of breath, nausea, vomiting, diarrhea,, constipation  Objective: Vital Signs: Blood pressure 92/0, pulse 103, temperature 98.2 F (36.8 C), temperature source Oral, resp. rate 17, weight 76 kg (167 lb 8.8 oz), SpO2 100 %. No results found. Results for orders placed or performed during the hospital encounter of 09/25/15 (from the past 72 hour(s))  Protime-INR     Status: Abnormal   Collection Time: 09/30/15  4:40 AM  Result Value Ref Range   Prothrombin Time 26.4 (H) 11.6 - 15.2 seconds   INR 2.46 (H) 0.00 - 1.49  Renal function panel     Status: Abnormal   Collection Time: 09/30/15  4:40 AM  Result Value Ref Range   Sodium 138 135 - 145 mmol/L   Potassium 3.8 3.5 - 5.1 mmol/L   Chloride 98 (L) 101 - 111 mmol/L   CO2 26 22 - 32 mmol/L   Glucose, Bld 85 65 - 99 mg/dL   BUN 53 (H) 6 - 20 mg/dL   Creatinine, Ser 6.42 (H) 0.61 - 1.24 mg/dL   Calcium 9.0 8.9 - 10.3 mg/dL   Phosphorus 7.8 (H) 2.5 - 4.6 mg/dL   Albumin 2.0 (L) 3.5 - 5.0 g/dL   GFR calc non Af Amer 9 (L) >60 mL/min   GFR calc Af Amer 11 (L) >60 mL/min    Comment: (NOTE) The eGFR has been calculated using the CKD EPI equation. This calculation has not been validated in all clinical situations. eGFR's persistently <60 mL/min signify possible Chronic Kidney Disease.    Anion gap 14 5 - 15  CBC with Differential/Platelet     Status: Abnormal   Collection Time: 09/30/15  4:40 AM  Result Value Ref Range   WBC 8.9 4.0 - 10.5 K/uL   RBC 3.36 (L) 4.22 - 5.81 MIL/uL   Hemoglobin 8.6 (L) 13.0 - 17.0 g/dL   HCT 27.2 (L) 39.0 - 52.0 %   MCV 81.0 78.0 - 100.0 fL   MCH 25.6 (L) 26.0 - 34.0 pg   MCHC 31.6 30.0 - 36.0 g/dL   RDW 18.4 (H) 11.5 - 15.5 %   Platelets 213 150 - 400 K/uL    Comment: PLATELET COUNT  CONFIRMED BY SMEAR   Neutrophils Relative % 79 %   Neutro Abs 7.0 1.7 - 7.7 K/uL   Lymphocytes Relative 13 %   Lymphs Abs 1.2 0.7 - 4.0 K/uL   Monocytes Relative 7 %   Monocytes Absolute 0.6 0.1 - 1.0 K/uL   Eosinophils Relative 1 %   Eosinophils Absolute 0.1 0.0 - 0.7 K/uL   Basophils Relative 0 %   Basophils Absolute 0.0 0.0 - 0.1 K/uL  Protime-INR     Status: Abnormal   Collection Time: 10/01/15  4:10 AM  Result Value Ref Range   Prothrombin Time 24.0 (H) 11.6 - 15.2 seconds   INR 2.18 (H) 0.00 - 1.49  Renal function panel     Status: Abnormal   Collection Time: 10/01/15  4:10 AM  Result Value Ref Range   Sodium 135 135 - 145 mmol/L   Potassium 3.7 3.5 - 5.1 mmol/L   Chloride 95 (L) 101 - 111 mmol/L   CO2 25 22 - 32 mmol/L   Glucose, Bld 81 65 - 99 mg/dL   BUN 59 (H) 6 - 20 mg/dL  Creatinine, Ser 6.53 (H) 0.61 - 1.24 mg/dL   Calcium 9.0 8.9 - 10.3 mg/dL   Phosphorus 7.8 (H) 2.5 - 4.6 mg/dL   Albumin 2.0 (L) 3.5 - 5.0 g/dL   GFR calc non Af Amer 9 (L) >60 mL/min   GFR calc Af Amer 11 (L) >60 mL/min    Comment: (NOTE) The eGFR has been calculated using the CKD EPI equation. This calculation has not been validated in all clinical situations. eGFR's persistently <60 mL/min signify possible Chronic Kidney Disease.    Anion gap 15 5 - 15  CBC with Differential/Platelet     Status: Abnormal   Collection Time: 10/01/15  4:10 AM  Result Value Ref Range   WBC 10.3 4.0 - 10.5 K/uL    Comment: WHITE COUNT CONFIRMED ON SMEAR   RBC 3.20 (L) 4.22 - 5.81 MIL/uL   Hemoglobin 8.2 (L) 13.0 - 17.0 g/dL   HCT 26.0 (L) 39.0 - 52.0 %   MCV 81.3 78.0 - 100.0 fL   MCH 25.6 (L) 26.0 - 34.0 pg   MCHC 31.5 30.0 - 36.0 g/dL   RDW 18.8 (H) 11.5 - 15.5 %   Platelets 197 150 - 400 K/uL    Comment: PLATELET COUNT CONFIRMED BY SMEAR LARGE PLATELETS PRESENT    Neutrophils Relative % 72 %   Lymphocytes Relative 16 %   Monocytes Relative 9 %   Eosinophils Relative 2 %   Basophils Relative 1  %   Neutro Abs 7.5 1.7 - 7.7 K/uL   Lymphs Abs 1.6 0.7 - 4.0 K/uL   Monocytes Absolute 0.9 0.1 - 1.0 K/uL   Eosinophils Absolute 0.2 0.0 - 0.7 K/uL   Basophils Absolute 0.1 0.0 - 0.1 K/uL   RBC Morphology POLYCHROMASIA PRESENT     Comment: TARGET CELLS SPHEROCYTES    Smear Review LARGE PLATELETS PRESENT   Protime-INR     Status: Abnormal   Collection Time: 10/02/15  5:00 AM  Result Value Ref Range   Prothrombin Time 26.9 (H) 11.6 - 15.2 seconds   INR 2.52 (H) 0.00 - 1.49  CBC with Differential/Platelet     Status: Abnormal   Collection Time: 10/02/15  5:00 AM  Result Value Ref Range   WBC 12.2 (H) 4.0 - 10.5 K/uL   RBC 3.56 (L) 4.22 - 5.81 MIL/uL   Hemoglobin 9.0 (L) 13.0 - 17.0 g/dL   HCT 28.2 (L) 39.0 - 52.0 %   MCV 79.2 78.0 - 100.0 fL   MCH 25.3 (L) 26.0 - 34.0 pg   MCHC 31.9 30.0 - 36.0 g/dL   RDW 18.5 (H) 11.5 - 15.5 %   Platelets 223 150 - 400 K/uL   Neutrophils Relative % 82 %   Neutro Abs 10.0 (H) 1.7 - 7.7 K/uL   Lymphocytes Relative 11 %   Lymphs Abs 1.4 0.7 - 4.0 K/uL   Monocytes Relative 6 %   Monocytes Absolute 0.8 0.1 - 1.0 K/uL   Eosinophils Relative 1 %   Eosinophils Absolute 0.1 0.0 - 0.7 K/uL   Basophils Relative 0 %   Basophils Absolute 0.0 0.0 - 0.1 K/uL  Renal function panel     Status: Abnormal   Collection Time: 10/02/15  5:15 AM  Result Value Ref Range   Sodium 133 (L) 135 - 145 mmol/L   Potassium 3.5 3.5 - 5.1 mmol/L   Chloride 94 (L) 101 - 111 mmol/L   CO2 25 22 - 32 mmol/L   Glucose, Bld 110 (H)  65 - 99 mg/dL   BUN 60 (H) 6 - 20 mg/dL   Creatinine, Ser 6.49 (H) 0.61 - 1.24 mg/dL   Calcium 8.9 8.9 - 10.3 mg/dL   Phosphorus 8.0 (H) 2.5 - 4.6 mg/dL   Albumin 2.1 (L) 3.5 - 5.0 g/dL   GFR calc non Af Amer 9 (L) >60 mL/min   GFR calc Af Amer 11 (L) >60 mL/min    Comment: (NOTE) The eGFR has been calculated using the CKD EPI equation. This calculation has not been validated in all clinical situations. eGFR's persistently <60 mL/min signify  possible Chronic Kidney Disease.    Anion gap 14 5 - 15     HEENT: normal Cardio: RRR and LVAD hum Resp: CTA B/L and unlabored GI: BS positive and i'll distention nontender Extremity:  Pulses positive and No Edema Skin:   Intact and Other LVAD wire site remains clean and dry Neuro: Alert/Oriented, Normal Sensory and Abnormal Motor 4/5 bilateral deltoid, biceps, triceps, grip, hip flexor, knee extensor, ankle dorsal flexor plantar flexor Musc/Skel:  Normal and Other no pain with upper extremity lower extremity range of motion  wasn't  cervical spine Gen. no acute distress   Assessment/Plan: 1. Functional deficits secondary to Debility which require 3+ hours per day of interdisciplinary therapy in a comprehensive inpatient rehab setting. Physiatrist is providing close team supervision and 24 hour management of active medical problems listed below. Physiatrist and rehab team continue to assess barriers to discharge/monitor patient progress toward functional and medical goals. FIM: Function - Bathing Bathing activity did not occur:  (Pt completed all bathing and dressing prior to sessions without assist) Position: Sitting EOB (Standing at sink except for seated at EOB to wash BLE) Body parts bathed by patient: Right arm, Left arm, Chest, Abdomen, Front perineal area, Buttocks, Right upper leg, Left upper leg, Right lower leg, Left lower leg, Back Assist Level: More than reasonable time Set up : To obtain items  Function- Upper Body Dressing/Undressing Upper body dressing/undressing activity did not occur:  (no clothes on eval) What is the patient wearing?: Pull over shirt/dress Pull over shirt/dress - Perfomed by patient: Thread/unthread right sleeve, Thread/unthread left sleeve, Put head through opening, Pull shirt over trunk Assist Level: No help, No cues Function - Lower Body Dressing/Undressing What is the patient wearing?: Underwear, Pants, Socks, Shoes Position: Sitting  EOB Underwear - Performed by patient: Thread/unthread right underwear leg, Thread/unthread left underwear leg, Pull underwear up/down Pants- Performed by patient: Thread/unthread right pants leg, Thread/unthread left pants leg, Pull pants up/down, Fasten/unfasten pants Non-skid slipper socks- Performed by patient: Don/doff right sock, Don/doff left sock Socks - Performed by patient: Don/doff right sock, Don/doff left sock Shoes - Performed by patient: Fasten left, Fasten right, Don/doff left shoe, Don/doff right shoe TED Hose - Performed by patient: Don/doff right TED hose, Don/doff left TED hose Assist for footwear: Independent Assist for lower body dressing: No Help, No cues  Function - Toileting Toileting steps completed by patient: Adjust clothing prior to toileting, Performs perineal hygiene, Adjust clothing after toileting Toileting Assistive Devices: Grab bar or rail Assist level: More than reasonable time  Function Midwife transfer assistive device: Grab bar Assist level to toilet: No Help, No cues Assist level from toilet: No Help, No cues  Function - Chair/bed transfer Chair/bed transfer assist level: No help, no cues Chair/bed transfer assistive device: Armrests Chair/bed transfer details: Verbal cues for technique, Verbal cues for sequencing  Function - Locomotion: Wheelchair Will patient  use wheelchair at discharge?: No Function - Locomotion: Ambulation Assistive device: No device Max distance: 300 Assist level: Supervision or verbal cues Assist level: No Help, No cues Assist level: Supervision or verbal cues Assist level: No Help, No cues Assist level: No Help, No cues  Function - Comprehension Comprehension: Auditory Comprehension assist level: Follows complex conversation/direction with no assist  Function - Expression Expression: Verbal Expression assist level: Expresses complex ideas: With no assist  Function - Social Interaction Social  Interaction assist level: Interacts appropriately with others - No medications needed.  Function - Problem Solving Problem solving assist level: Solves complex problems: Recognizes & self-corrects  Function - Memory Memory assist level: Complete Independence: No helper Patient normally able to recall (first 3 days only): Current season   Medical Problem List and Plan: 1.  Debilitation secondary to C. Difficile/MSSA and abscess associated with LVAD. Status post irrigation debridement of chest sternal wound 09/09/2015-plan discharge in a.m., if cardiology not comfortable with this ,will transfer pt to their service 2.  DVT Prophylaxis/Anticoagulation: Coumadin therapy for history of atrial fibrillation. Monitor for any bleeding episodes, pharmacy protocol 3. Pain Management: Lyrica 75 mg daily,Ultram '50mg'$  every 6 hour, Oxycodone as needed 4. Mood: Celexa 20 mg daily 5. Neuropsych: This patient is capable of making decisions on his own behalf. 6. Skin/Wound Care: Routine skin checks 7. Fluids/Electrolytes/Nutrition:  I personally reviewed the patient's labs today.  8. ID. LVAD driveline infection/MSSA. Presently day 38/42 Cefazolin and rifampin.Follow up with infectious disease Dr. Linus Salmons 9. Clostridium difficile. Twice a day vancomycin through 10/08/2015. 10. AKI. Follow-up renal services.No pending plan for dialysis at this time. Follow-up chemistries are stable, good renal output 11. Hypertension. Hydralazine 25 mg every 8 hours, Revatio 20 mg 3 times a day 12. Ileus. Resolved. Diet advance. Nausea felt to be related to antibiotics. Continue Reglan for now 13. Atrial fibrillation. Cardiac rate controlled. Continue Coumadin 14. Acute on chronic anemia. Follow-up CBC hemoglobin stable 15.Gout.asymptomatic Continue Zyloprim  LOS (Days) 7 A FACE TO FACE EVALUATION WAS PERFORMED  KIRSTEINS,ANDREW E 10/02/2015, 9:17 AM

## 2015-10-08 ENCOUNTER — Encounter (HOSPITAL_COMMUNITY): Payer: Self-pay

## 2015-10-08 NOTE — Progress Notes (Signed)
Fax received from [place] regarding patient for medical record request 07/20/2015-present. >200 pages mailed to main address as listed on website (no address provided) [name], [name]  [name]

## 2015-10-09 ENCOUNTER — Ambulatory Visit (HOSPITAL_COMMUNITY): Payer: Self-pay | Admitting: Infectious Diseases

## 2015-10-09 ENCOUNTER — Encounter (HOSPITAL_COMMUNITY): Payer: Self-pay

## 2015-10-09 ENCOUNTER — Other Ambulatory Visit (HOSPITAL_COMMUNITY): Payer: Self-pay | Admitting: Infectious Diseases

## 2015-10-09 ENCOUNTER — Telehealth: Payer: Self-pay | Admitting: Family Medicine

## 2015-10-09 ENCOUNTER — Telehealth (HOSPITAL_COMMUNITY): Payer: Self-pay

## 2015-10-09 ENCOUNTER — Ambulatory Visit (HOSPITAL_COMMUNITY)
Admit: 2015-10-09 | Discharge: 2015-10-09 | Disposition: A | Discharge status: Home / Self Care | Payer: 59 | Source: Ambulatory Visit | Attending: Cardiology | Admitting: Cardiology

## 2015-10-09 VITALS — BP 92/0 | HR 50 | Ht 67.0 in | Wt 152.6 lb

## 2015-10-09 DIAGNOSIS — Z6823 Body mass index (BMI) 23.0-23.9, adult: Secondary | ICD-10-CM | POA: Insufficient documentation

## 2015-10-09 DIAGNOSIS — N179 Acute kidney failure, unspecified: Secondary | ICD-10-CM

## 2015-10-09 DIAGNOSIS — L24A9 Irritant contact dermatitis due friction or contact with other specified body fluids: Secondary | ICD-10-CM

## 2015-10-09 DIAGNOSIS — I110 Hypertensive heart disease with heart failure: Secondary | ICD-10-CM | POA: Diagnosis not present

## 2015-10-09 DIAGNOSIS — Z95811 Presence of heart assist device: Secondary | ICD-10-CM

## 2015-10-09 DIAGNOSIS — L089 Local infection of the skin and subcutaneous tissue, unspecified: Secondary | ICD-10-CM

## 2015-10-09 DIAGNOSIS — D649 Anemia, unspecified: Secondary | ICD-10-CM | POA: Insufficient documentation

## 2015-10-09 DIAGNOSIS — M109 Gout, unspecified: Secondary | ICD-10-CM | POA: Insufficient documentation

## 2015-10-09 DIAGNOSIS — Z79899 Other long term (current) drug therapy: Secondary | ICD-10-CM | POA: Diagnosis not present

## 2015-10-09 DIAGNOSIS — E43 Unspecified severe protein-calorie malnutrition: Secondary | ICD-10-CM

## 2015-10-09 DIAGNOSIS — E46 Unspecified protein-calorie malnutrition: Secondary | ICD-10-CM | POA: Diagnosis not present

## 2015-10-09 DIAGNOSIS — Z7901 Long term (current) use of anticoagulants: Secondary | ICD-10-CM

## 2015-10-09 DIAGNOSIS — I5022 Chronic systolic (congestive) heart failure: Secondary | ICD-10-CM | POA: Diagnosis not present

## 2015-10-09 DIAGNOSIS — M069 Rheumatoid arthritis, unspecified: Secondary | ICD-10-CM | POA: Diagnosis not present

## 2015-10-09 DIAGNOSIS — Z95810 Presence of automatic (implantable) cardiac defibrillator: Secondary | ICD-10-CM | POA: Insufficient documentation

## 2015-10-09 DIAGNOSIS — I480 Paroxysmal atrial fibrillation: Secondary | ICD-10-CM | POA: Diagnosis not present

## 2015-10-09 DIAGNOSIS — T148XXA Other injury of unspecified body region, initial encounter: Secondary | ICD-10-CM

## 2015-10-09 LAB — COMPREHENSIVE METABOLIC PANEL
ALBUMIN: 2.2 g/dL — AB (ref 3.5–5.0)
ALBUMIN: 2.2 g/dL — AB (ref 3.5–5.0)
ALK PHOS: 272 U/L — AB (ref 38–126)
ALT: 14 U/L — AB (ref 17–63)
ALT: 8 U/L — ABNORMAL LOW (ref 17–63)
AST: 102 U/L — ABNORMAL HIGH (ref 15–41)
AST: 87 U/L — AB (ref 15–41)
Alkaline Phosphatase: 270 U/L — ABNORMAL HIGH (ref 38–126)
Anion gap: 10 (ref 5–15)
Anion gap: 11 (ref 5–15)
BUN: 78 mg/dL — ABNORMAL HIGH (ref 6–20)
BUN: 77 mg/dL — AB (ref 6–20)
CHLORIDE: 102 mmol/L (ref 101–111)
CHLORIDE: 102 mmol/L (ref 101–111)
CO2: 23 mmol/L (ref 22–32)
CO2: 23 mmol/L (ref 22–32)
CREATININE: 4.52 mg/dL — AB (ref 0.61–1.24)
Calcium: 9.4 mg/dL (ref 8.9–10.3)
Calcium: 9.3 mg/dL (ref 8.9–10.3)
Creatinine, Ser: 4.46 mg/dL — ABNORMAL HIGH (ref 0.61–1.24)
GFR calc Af Amer: 17 mL/min — ABNORMAL LOW (ref >60)
GFR calc non Af Amer: 14 mL/min — ABNORMAL LOW (ref >60)
GFR, EST AFRICAN AMERICAN: 16 mL/min — AB (ref >60)
GFR, EST NON AFRICAN AMERICAN: 14 mL/min — AB (ref >60)
GLUCOSE: 88 mg/dL (ref 65–99)
Glucose, Bld: 91 mg/dL (ref 65–99)
POTASSIUM: 4.5 mmol/L (ref 3.5–5.1)
Potassium: 5.5 mmol/L — ABNORMAL HIGH (ref 3.5–5.1)
SODIUM: 135 mmol/L (ref 135–145)
SODIUM: 136 mmol/L (ref 135–145)
Total Bilirubin: 2.9 mg/dL — ABNORMAL HIGH (ref 0.3–1.2)
Total Bilirubin: 2.9 mg/dL — ABNORMAL HIGH (ref 0.3–1.2)
Total Protein: 9.4 g/dL — ABNORMAL HIGH (ref 6.5–8.1)
Total Protein: 9.4 g/dL — ABNORMAL HIGH (ref 6.5–8.1)

## 2015-10-09 LAB — CBC
HEMATOCRIT: 28.3 % — AB (ref 39.0–52.0)
HEMOGLOBIN: 9 g/dL — AB (ref 13.0–17.0)
MCH: 24.9 pg — AB (ref 26.0–34.0)
MCHC: 31.8 g/dL (ref 30.0–36.0)
MCV: 78.4 fL (ref 78.0–100.0)
PLATELETS: 185 10*3/uL (ref 150–400)
RBC: 3.61 MIL/uL — AB (ref 4.22–5.81)
RDW: 16.8 % — ABNORMAL HIGH (ref 11.5–15.5)
WBC: 10.3 10*3/uL (ref 4.0–10.5)

## 2015-10-09 LAB — PROTIME-INR
INR: 2.38 — ABNORMAL HIGH (ref 0.00–1.49)
Prothrombin Time: 25.7 seconds — ABNORMAL HIGH (ref 11.6–15.2)

## 2015-10-09 LAB — PREALBUMIN: PREALBUMIN: 14.2 mg/dL — AB (ref 18–38)

## 2015-10-09 LAB — LACTATE DEHYDROGENASE: LDH: 330 U/L — AB (ref 98–192)

## 2015-10-09 MED ORDER — CEPHALEXIN 250 MG PO CAPS: 250.0000 mg | ORAL_CAPSULE | Freq: Three times a day (TID) | ORAL | Status: DC

## 2015-10-09 NOTE — Progress Notes (Addendum)
Symptom Yes No Details  Angina  x Activity:  Claudication  x How far:  Syncope  x When:  Stroke  x   Orthopnea  x How many pillows:  PND  x How often:  CPAP  x How many hrs:  Pedal edema  x   Abd fullness  x   N&V  x   Diaphoresis  x When:  Bleeding  x   Urine  x   SOB  x Activity:  Palpitations  x When:  ICD shock  x   Hospitlizaitons x  When/where/why:  ED visit  x When/where/why:  Other MD  x When/who/why:  Activity  x   Fluid  x 1200cc fluid restriction  Diet      Vital Signs: Doppler BP: 92 Automatic BP: 107/74 (88) HR: 50 SPO2: 100%  Weight: 152.6 lbs Home weights: 145 - 148 Last weight:    VAD interrogation & Equipment Management (reviewed with Dr Aundra Dubin): Speed:  9200 Flow:  3.7 Power: 4.8w PI: 7.6  Alarms:  none Events:  0 - 6 every few days  Fixed speed: 9200 Low speed limit: 8600  Primary Controller:  Replace back up battery in  23 months. Back up controller:   Replace back up battery in  23 months.  Annual Equipment Maintenance on UBC/PM was performed on September 2016.   I reviewed the LVAD parameters from today and compared the results to the patient's prior recorded data.  LVAD interrogation was NEGATIVE for significant power changes, NEGATIVE for clinical alarms and STABLE for PI events/speed drops. No programming changes were made and pump is functioning within specified parameters.   LVAD equipment check completed and is in good working order. Back-up equipment present. LVAD education done on emergency procedures and precautions and reviewed exit site care.   VAD coordinator reviewed daily log from home for daily temperature, weight, and VAD parameters. Pt is performing daily controller and system monitor self tests along with completing weekly and monthly maintenance for LVAD equipment.    Exit Site Care: Drive line is being maintained weekly by VAD Coordinators. Pt/caregiver deny any alarms or VAD equipment issues.  Drive line exit  site well healed and incorporated. The velour is fully implanted at exit site. Dressing dry and intact. No erythema or drainage. Stabilization device present and accurately applied. Driveline dressing is being changed weekly per sterile technique using Sorbaview dressing with biopatch on exit site. Pt denies fever or chills.   Sternal Wound Care: Wound is being maintained by [facility]. Instructions are to pack with Aquacel Ag and paint with Betadine QM/W/F. No silver present today. May be out of supplies - provided extra in clinic today along with gauze pads. Small open area to incision still. <1cm in length and 0.75 cm deep. Bleeding elicited with probing wound. Tan-brown mucopurulent drainage noted to sponge.     Encounter Details: Patient presents for hospital D/C follow up in [facility] today with his brother. Reports he is doing ok. "It has been a struggle." Reports he is having difficult time sleeping. Has resumed taking Ambien that was previously Rx'd by Dr. Aundra Dubin. He has dropped about 30 lbs since initial hospitalization. Reports he has no desire to eat. Not necessarily nausea. Denies vomiting, abdominal bloating or diarrhea. He has been doing rehab on his own accord at home 3-4 days a week with previous Cardiac Rehab program.   Completed IV antibiotics yesterday as well as Rifampin. Reviewed chart and 10/08/15 was the last dose for  IV ancef and 6/17 last dose for Rifampin/Vanc. With purulence from dressing site d/w Dr. Prescott Gum - wound culture obtained today. Started on PO keflex for h/o MSSA.    HR 50s today. D/C rate in 90s. D/W Dr. Aundra Dubin and will obtain EKG next visit. Off amiodarone since hospitalization.   MAPs 88 - 92 today with doppler reflecting MAP.   Labs: INR 3.3 Monday per POC test. He held a dose of warfarin at that point. Recheck today is 2.38. Anticoagulation management includes INR goal of 2 - 2.5 with no ASA. See anticoagulation flow sheet.   Hgb 9.0 today - stable since D/C  on current anticoagulation. Iron studies 3w ago showed low Iron and TIBC. Received IV FeSO4 x 1 dose in the hospital.   LDH slightly higher at 330 with established baseline of 250 - 300. Denies tea-colored urine.   LFTs still remain elevated but improved: AST - 87, TBili - 2.9, Alkaline Phos - 270.   Creatinine 4.46 down from 6.49 last week. K+ 4.5 on repeat CMET (first sample hemolyzed).  Prealbumin 14 up from 8. Still low. Dr. Aundra Dubin advised to start Ensure BID between meals.   Medication Changes at this encounter: Keflex 250mg  TID (Creatinine Clearance 19, D/W Erika Pharm D), Continue warfarin 4 mg QD, Start Iron PO BID with meals.   D/W Doroteo Bradford re: warfarin. With stopping Rifampin and starting Ensure difficult to determine effect. Continue 4mg  QD for now and have HHRN check POC Friday.   RTC in 1 week  for F/U visit to reassess wound and EKG.   [name], RN VAD Coordinator   Office: [phone] 24/7 VAD Pager: [phone]  Patient ID: [name], male   DOB: 1970-07-20, 45 y.o.   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  Admitted from [facility] in [place] in 5/17 with LVAD driveline abscess. Hospitalization was complicated by septic shock, MSSA bacteremia, CDiff, and AKI. Prior to admit blood cultures from [facility] showed MSSA and C diff. Cardiac Surgery and ID consulted on admit.  He was taken to the operating room on May 9th for I and D of epigastric wound. Exposed LVAD hardware was noted. He returned to the OR May 11th for I and D and wound VAC placement. He also had a TEE that did not show any vegetation on valves or ICD wires. VAC later removed due to inability to seal. At that point he had sterile  dressing changes. Plastic Surgery was consulted for complex wound with exposed hardware. On May 22nd he returned to the OR with ABRA wound closure device and application of ACell. Over the next several days ABRA device was tightened. On May 30th he returned to the OR for closure of abdominal wound. Over the course of his hospitalization he required multiple blood products due to symptomatic anemia and blood loss from abdominal wound.   ID followed closely through out his hospitalization. He was placed on broad spectrum antibiotics with adjustments made based on blood cultures. He was being treated for MSSA bacteremia, C diff, and klebseilla PNA. He has now completed Ancef, oral Vanc, and Rifampin. Urine with >100K yeast while in hospital and completed course of fluconazole.   Due to  septic shock he developed AKI with after having MAPs down to the 40s. Initially requiring pressors and eventually weaned off. Nephrology was consulted and started CVVHD and later transitioned to intermittent HD. Gradually he started making urine and on the day of discharge he was urinating over 2 liters a day.   Due to severe deconditioning PT consulted with recommendations for CIR. On 09/25/15, he was transferred to CIR in stable condition. He was continued on coumadin with INR goal 2-2.5.   He is now home.  Seems to be doing well.  Weight continues to trend down and pre-albumen is low.  Still with some drainage from driveline site.  No exertional dyspnea, doing some walking.  BP good today with MAP 88. Creatinine now coming down.   Denies LVAD alarms.  Denies driveline trauma, erythema or drainage.  Denies ICD shocks.   Reports taking Coumadin as prescribed and adherence to anticoagulation based dietary restrictions.  Denies bright red blood per rectum or melena, no dark urine or hematuria.    Labs (5/17): K 5.5, creatinine 6.49 => 4.52, hgb 9, LDH 230, INR 2.38   Past Medical History  Diagnosis Date  . Chronic  systolic heart failure (HCC)     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%)  . Atrial fibrillation -parosysmal      Rx w amiodarone  . Noncompliance     H/O  MEDICAL NONCOMPLIANCE  . 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 Tricuspid valve regurgitation     SEVERE  . Severe mitral regurgitation   . Polymorphic ventricular tachycardia (HCC)     RECURRENT WITH APPROPRIATE SHOCK THERAPY IN THE PAST  . Ventricular fibrillation (HCC)     WITH APPROPRIATE SHOCK THERAPY IN THE PAST  . Hypertension   . Gout   . RA (rheumatoid arthritis) ([place])   . Automatic implantable cardiac defibrillator -BSX     single chamber  . GI bleed -massive     11 Units 2012  . Elevated LFTs   . H/O hyperthyroidism   . CHF (congestive heart failure) ([place])   . AKI (acute kidney injury) ([place])   . AICD (automatic cardioverter/defibrillator) present     Current Outpatient Prescriptions  Medication Sig Dispense Refill  . allopurinol (ZYLOPRIM) 100 MG tablet Take 1 tablet (100 mg total) by mouth daily. 30 tablet 6  . citalopram (CELEXA) 20 MG tablet Take 1 tablet (20 mg total) by mouth daily. 30 tablet 6  . metoCLOPramide (REGLAN) 5 MG tablet Take 1 tablet (5 mg total) by mouth every 6 (six) hours. (Patient taking differently: Take 5 mg by mouth every 6 (six) hours as needed. ) 120 tablet 0  . oxyCODONE (OXY IR/ROXICODONE) 5 MG immediate release tablet Take 1 tablet (5 mg total) by mouth every 3 (three) hours as needed for moderate pain or breakthrough pain. 90 tablet 0  . pregabalin (LYRICA) 75 MG capsule Take 1 capsule (75 mg total) by mouth daily. 60 capsule 11  . sildenafil (REVATIO) 20 MG tablet Take 1 tablet (20 mg total) by mouth 3 (three) times daily. 90 tablet 6  . simethicone (MYLICON) 80 MG chewable tablet Chew 1 tablet (80 mg total) by mouth 4 (four) times daily. (Patient taking differently: Chew 80 mg by mouth 4 (four) times daily as needed. ) 30 tablet 0    . tamsulosin (FLOMAX) 0.4 MG CAPS capsule Take 1 capsule (0.4 mg total) by mouth daily. 30 capsule 0  .  warfarin (COUMADIN) 4 MG tablet Take 1 tablet (4 mg total) by mouth one time only at 6 PM. 30 tablet 0  . zolpidem (AMBIEN) 10 MG tablet Take 10 mg by mouth at bedtime as needed for sleep.    . bisacodyl (DULCOLAX) 5 MG EC tablet Take 2 tablets (10 mg total) by mouth daily as needed for moderate constipation (Give daily if no BM). (Patient not taking: Reported on 10/09/2015) 30 tablet 0  . cephALEXin (KEFLEX) 250 MG capsule Take 1 capsule (250 mg total) by mouth 3 (three) times daily. 30 capsule 1  . docusate sodium (COLACE) 100 MG capsule Take 2 capsules (200 mg total) by mouth daily. (Patient not taking: Reported on 10/09/2015) 10 capsule 0  . furosemide (LASIX) 80 MG tablet Take 1 tablet (80 mg total) by mouth daily. 30 tablet 1   No current facility-administered medications for this encounter.    Phytonadione  REVIEW OF SYSTEMS: All systems negative except as listed in HPI, PMH and Problem list.   VAD interrogation & Equipment Management:  See LVAD nurse's note above.   Vital Signs:  BP 92/0 mmHg  Pulse 50  Ht 5\' 7"  (1.702 m)  Wt 152 lb 9.6 oz (69.219 kg)  BMI 23.89 kg/m2  SpO2 100% MAP 88  Physical Exam: GENERAL: Well appearing, male who presents to clinic today for nose bleed.  HEENT: normal  NECK: Supple, JVP 7 cm. No lymphadenopathy or thyromegaly appreciated.   CARDIAC:  Mechanical heart sounds with LVAD hum present.  LUNGS:  Clear to auscultation bilaterally.  ABDOMEN:  Soft, round, nontender, positive bowel sounds x4.     LVAD exit site:  Site is closing but there is still some drainage.  EXTREMITIES:  Warm and dry, no cyanosis, clubbing, rash or edema  NEUROLOGIC:  Alert and oriented x 4.  Gait steady.  No aphasia.  No dysarthria.  Affect pleasant.      ASSESSMENT AND PLAN:   1. Chronic systolic HF: Status post HMII LVAD implantation 12/20/14.  Now status post  long hospitalization as complication of driveline infection. Currently has NYHA class II symptoms on LVAD support. Volume looks ok.   - Continue Lasix 80 mg daily as we await renal recovery.  - Driveline site is closing but still with some drainage.  Will discuss with Dr Prescott Gum.   2. RV failure:  Now off milrinone.  Continue sildenafil.  Off digoxin with renal failure.  3. HTN: MAP stable off antihypertensives.  4. Atrial fibrillation: Paroxysmal.  He is in NSR today. On coumadin for VAD. He is no longer on amiodarone.  5. Anticoagulation management: INR goal 2.0-2.5, no aspirin.    6. AKI: Good UOP on Lasix 80 mg daily.  Creatinine starting to come down.  He has followup with nephrology. Repeat BMET with suspicion of hemolysis with high K.  7. Driveline infection: See above regarding site drainage.  He is now off antibiotics.   8. Anemia: Hemoglobin stable.  Will add po iron.  9. Malnutrition: Low pre-albumen.  Encouraged bid Ensure.   Loralie Champagne 10/09/2015

## 2015-10-09 NOTE — Telephone Encounter (Signed)
Attempted to CB Amy to provide VO for PICC removal and check POC INR Friday. Will try again tomorrow.

## 2015-10-09 NOTE — Telephone Encounter (Signed)
Amy RN with [facility] called to speak to [place] coordinator. Message forwarded to [facility] to call Amy back.  [name]

## 2015-10-09 NOTE — Patient Instructions (Addendum)
[facility]  [address] [place], [place] [address] Phone: [phone]   1. OK for Home Health nurse to pull out PICC line today  2. Creatinine (kidney function) much better  3. Continue every M/W/F dressing changes with gauze. Please pack with silver strips provided.   4. Return to clinic in 1 week for dressing changes.   5. Home Health will check an INR finger stick on Friday.   6. Start consuming Ensures twice a day between meals.   7. Start Iron pills twice a day with meals.   8. Start Keflex antibiotic today.

## 2015-10-09 NOTE — Telephone Encounter (Signed)
Amy from [facility] called stating patient is done with IV ABX. She is wanting to know if she can pull the pick? Please advise.  Amy # [phone]

## 2015-10-10 NOTE — Telephone Encounter (Signed)
Called to discuss POC with Amy. She D/C'd PICC line yesterday per AVS from [facility] visit. Requested to ensure that the silver is in direct contact with the wound and packed in opening per Dr. Prescott Gum. Updated about the addition of keflex and wound culture pending. Requested POC INR Friday (tomorrow) and likely 2x a week to watch effect of Rifampin withdrawal and Ensure addition.   She reports that the drainage from the sternum is improved. Same quality as we saw yesterday however much less quantity.   [name], RN VAD Coordinator   Office: [phone] 24/7 VAD Pager: [phone]

## 2015-10-10 NOTE — Telephone Encounter (Signed)
Spoke with Amy, Dr. Sung Amabile provided written orders to d/c the PICC-line on 10/09/15.

## 2015-10-10 NOTE — Telephone Encounter (Signed)
Tell her to call Dr. Sung Amabile

## 2015-10-11 ENCOUNTER — Ambulatory Visit (HOSPITAL_COMMUNITY): Payer: Self-pay | Admitting: Unknown Physician Specialty

## 2015-10-11 LAB — POCT INR: INR: 1.5

## 2015-10-12 LAB — AEROBIC CULTURE W GRAM STAIN (SUPERFICIAL SPECIMEN): Culture: NO GROWTH

## 2015-10-12 LAB — AEROBIC CULTURE  (SUPERFICIAL SPECIMEN)

## 2015-10-14 ENCOUNTER — Ambulatory Visit (HOSPITAL_COMMUNITY): Payer: Self-pay | Admitting: *Deleted

## 2015-10-14 LAB — POCT INR: INR: 1.9

## 2015-10-16 ENCOUNTER — Telehealth (HOSPITAL_COMMUNITY): Payer: Self-pay | Admitting: *Deleted

## 2015-10-16 NOTE — Telephone Encounter (Signed)
Message left from [facility] transplant office, Tilda Franco, stating pt had missed appointment for transplant evaluation.  Returned call and left message for Ms. Revonda Standard explaining pt has had recent hospitalizations, surgery, and is recovering. Asked that we be allowed to re-send referral for transplant at later date.

## 2015-10-17 ENCOUNTER — Other Ambulatory Visit (HOSPITAL_COMMUNITY): Payer: 59

## 2015-10-18 ENCOUNTER — Ambulatory Visit (HOSPITAL_COMMUNITY)
Admission: RE | Admit: 2015-10-18 | Discharge: 2015-10-18 | Disposition: A | Discharge status: Home / Self Care | Payer: 59 | Source: Ambulatory Visit | Attending: Cardiology | Admitting: Cardiology

## 2015-10-18 ENCOUNTER — Ambulatory Visit (HOSPITAL_COMMUNITY): Payer: Self-pay | Admitting: Infectious Diseases

## 2015-10-18 ENCOUNTER — Other Ambulatory Visit (HOSPITAL_COMMUNITY): Payer: Self-pay | Admitting: Infectious Diseases

## 2015-10-18 ENCOUNTER — Telehealth (HOSPITAL_COMMUNITY): Payer: Self-pay | Admitting: Infectious Diseases

## 2015-10-18 DIAGNOSIS — Z7901 Long term (current) use of anticoagulants: Secondary | ICD-10-CM | POA: Diagnosis not present

## 2015-10-18 DIAGNOSIS — Z95811 Presence of heart assist device: Secondary | ICD-10-CM

## 2015-10-18 DIAGNOSIS — N170 Acute kidney failure with tubular necrosis: Secondary | ICD-10-CM

## 2015-10-18 DIAGNOSIS — T148XXA Other injury of unspecified body region, initial encounter: Secondary | ICD-10-CM

## 2015-10-18 DIAGNOSIS — L089 Local infection of the skin and subcutaneous tissue, unspecified: Secondary | ICD-10-CM

## 2015-10-18 LAB — CBC
HEMATOCRIT: 30.4 % — AB (ref 39.0–52.0)
HEMOGLOBIN: 9.8 g/dL — AB (ref 13.0–17.0)
MCH: 26.1 pg (ref 26.0–34.0)
MCHC: 32.2 g/dL (ref 30.0–36.0)
MCV: 81.1 fL (ref 78.0–100.0)
Platelets: 193 10*3/uL (ref 150–400)
RBC: 3.75 MIL/uL — AB (ref 4.22–5.81)
RDW: 17.7 % — ABNORMAL HIGH (ref 11.5–15.5)
WBC: 10.4 10*3/uL (ref 4.0–10.5)

## 2015-10-18 LAB — COMPREHENSIVE METABOLIC PANEL
ALBUMIN: 2.5 g/dL — AB (ref 3.5–5.0)
ALK PHOS: 252 U/L — AB (ref 38–126)
ALT: 23 U/L (ref 17–63)
AST: 53 U/L — ABNORMAL HIGH (ref 15–41)
Anion gap: 9 (ref 5–15)
BUN: 63 mg/dL — AB (ref 6–20)
CHLORIDE: 106 mmol/L (ref 101–111)
CO2: 20 mmol/L — AB (ref 22–32)
CREATININE: 2.63 mg/dL — AB (ref 0.61–1.24)
Calcium: 9.1 mg/dL (ref 8.9–10.3)
GFR calc Af Amer: 32 mL/min — ABNORMAL LOW (ref >60)
GFR calc non Af Amer: 27 mL/min — ABNORMAL LOW (ref >60)
GLUCOSE: 107 mg/dL — AB (ref 65–99)
Potassium: 5.2 mmol/L — ABNORMAL HIGH (ref 3.5–5.1)
SODIUM: 135 mmol/L (ref 135–145)
Total Bilirubin: 2.1 mg/dL — ABNORMAL HIGH (ref 0.3–1.2)
Total Protein: 8.7 g/dL — ABNORMAL HIGH (ref 6.5–8.1)

## 2015-10-18 LAB — PROTIME-INR
INR: 2.13 — ABNORMAL HIGH (ref 0.00–1.49)
Prothrombin Time: 23.6 seconds — ABNORMAL HIGH (ref 11.6–15.2)

## 2015-10-18 LAB — LACTATE DEHYDROGENASE: LDH: 228 U/L — AB (ref 98–192)

## 2015-10-18 MED ORDER — CEPHALEXIN 500 MG PO CAPS: 500.0000 mg | ORAL_CAPSULE | Freq: Three times a day (TID) | ORAL | Status: DC

## 2015-10-18 NOTE — Telephone Encounter (Addendum)
Called results of chemistry panel. Also reviewed with Abbe Amsterdam D re: keflex dose since Dr. Prescott Gum would like that continued per discussion with him yesterday about plan of care. Creatinine much improved today and will send in new Rx for adjusted dose of antibiotic to Keflex 500 mg TID.   Requesting an expected RTW date per medical team. Advised that we will write the letter to provide his HR department an update of his condition per his request once Dr. Prescott Gum can assess wound status Monday.   [name], RN VAD Coordinator   Office: [phone] 24/7 VAD Pager: [phone]

## 2015-10-18 NOTE — Patient Instructions (Signed)
Dr. Lucianne Lei Trigt's office information: [address], [place], [place] [address]  [phone]   Continue taking your iron twice a day. Hemoglobin is 9.8!   If you have constipation start taking Colace (Docusate Sodium) 1 capsule twice a day and / or Biscodyl 10 mg tab once a day.

## 2015-10-18 NOTE — Progress Notes (Signed)
Driveline Dressing Change:  Drive line is being maintained by VAD coordinator. Denies any fevers, chills, drainage or foul odor. VAD dressing removed and site care performed using sterile technique. Drive line exit site cleaned with Chlora prep applicators x 2, allowed to dry, and Sorbaview dressing with biopatch on exit site applied. Exit site with complete tissue ingrowth, the velour is fully implanted at exit site. No redness, tenderness, drainage, or foul odor noted. New anchor device attached. Driveline dressing is being changed weekly per sterile technique.   Sternal Dressing Change: Still with dark tan/green drainage from sternal dressing. Small 0.5 cm incision to lower boarder of the old incision. No odor to drainage. Denies any fevers/chills, no erythema or warmth to site. No pain with palpation but wound boarders appear more edematous compared to last week. Cleansed with CHG prep on wound boarders and sterile saline. Aquacel strip applied and packed to wound bed. Dry gauze and tape applied over top. FU with Dr. Prescott Gum scheduled on Monday.         [name], RN, BSN, CCRN  VAD Coordinator   Office: [phone] 24/7 [place] Pager: [phone]

## 2015-10-21 ENCOUNTER — Encounter: Payer: Self-pay | Admitting: Cardiothoracic Surgery

## 2015-10-21 ENCOUNTER — Ambulatory Visit (INDEPENDENT_AMBULATORY_CARE_PROVIDER_SITE_OTHER): Payer: 59 | Admitting: Cardiothoracic Surgery

## 2015-10-21 VITALS — Ht 67.0 in | Wt 161.2 lb

## 2015-10-21 DIAGNOSIS — T798XXD Other early complications of trauma, subsequent encounter: Secondary | ICD-10-CM

## 2015-10-21 DIAGNOSIS — Z95811 Presence of heart assist device: Secondary | ICD-10-CM

## 2015-10-21 DIAGNOSIS — S31109D Unspecified open wound of abdominal wall, unspecified quadrant without penetration into peritoneal cavity, subsequent encounter: Secondary | ICD-10-CM

## 2015-10-21 DIAGNOSIS — L089 Local infection of the skin and subcutaneous tissue, unspecified: Secondary | ICD-10-CM

## 2015-10-21 NOTE — Progress Notes (Signed)
PCP is Sandi Mariscal, MD Referring Provider is Sandi Mariscal, MD  Chief Complaint  Patient presents with  . 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 The patient was implanted with VAD therapy last year for nonischemic cardio myopathy. He did well until earlier this year when he developed sepsis and MSSA bacteremia. He had a exposed pump in the lower part of the sternal incision and was in acute renal failure requiring dialysis. His renal function has improved. His wound infection was treated with debridement wound VAC and then delayed closure. He has a small persistent sinus tract with granulation tissue and drainage. Last cultures of the wound were negative. He is on a suppressive dose of oral Keflex. The patient is not a candidate for pump replacement. Patient is currently functioning fairly well with dressing changes 3 days a week by the home health nurses.  Unfortunately the sinus tract is almost closed over and is not being packed with Aquacel by the home health nurse. I opened the sinus tract, The Aquacel and to thin strips and packed the space beneath the skin on top of the titanium pump. This will continue Monday Wednesday Friday by home health nurses.  Past Medical History  Diagnosis Date  . Chronic systolic heart failure (HCC)     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%)  . Atrial fibrillation -parosysmal      Rx w amiodarone  . Noncompliance     H/O  MEDICAL NONCOMPLIANCE  . 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 Tricuspid valve regurgitation     SEVERE  . Severe mitral regurgitation   . Polymorphic ventricular tachycardia (HCC)     RECURRENT WITH APPROPRIATE SHOCK THERAPY IN THE PAST  . Ventricular fibrillation (HCC)     WITH APPROPRIATE SHOCK THERAPY IN THE PAST  . Hypertension   . Gout   . RA  (rheumatoid arthritis) ([place])   . Automatic implantable cardiac defibrillator -BSX     single chamber  . GI bleed -massive     11 Units 2012  . Elevated LFTs   . H/O hyperthyroidism   . CHF (congestive heart failure) ([place])   . AKI (acute kidney injury) ([place])   . AICD (automatic cardioverter/defibrillator) present     Past Surgical History  Procedure Laterality Date  . Cardiac catheterization  06/2006    RIGHT HEART CATH SHOWING SEVERE BIVENTRICUALR CHF WITH MARKED FILLING AND PRESSURES  . Insert / replace / remove pacemaker      GUIDANT HE ICD MODEL 2180, SERIAL # [id number]  . Cholecystectomy    . Implantable cardioverter defibrillator generator change N/A 07/01/2011    Procedure: IMPLANTABLE CARDIOVERTER DEFIBRILLATOR GENERATOR CHANGE;  Surgeon: Deboraha Sprang, MD;  Location: [facility] CATH LAB;  Service: Cardiovascular;  Laterality: N/A;  . Tee without cardioversion N/A 12/12/2014    Procedure: TRANSESOPHAGEAL ECHOCARDIOGRAM (TEE);  Surgeon: Jerline Pain, MD;  Location: [place];  Service: Cardiovascular;  Laterality: N/A;  . Cardiac catheterization N/A 12/14/2014    Procedure: Right Heart Cath;  Surgeon: Jolaine Artist, MD;  Location: [place] CV LAB;  Service: Cardiovascular;  Laterality: N/A;  . Cardiac catheterization N/A 12/14/2014    Procedure: IABP Insertion;  Surgeon: Jolaine Artist, MD;  Location: [place] CV LAB;  Service: Cardiovascular;  Laterality: N/A;  . Insertion of implantable left ventricular assist device N/A 12/20/2014    Procedure: INSERTION OF IMPLANTABLE LEFT VENTRICULAR ASSIST DEVICE;  Surgeon: Ivin Poot, MD;  Location: [place];  Service: Open Heart Surgery;  Laterality: N/A;  CIRC ARREST  NITRIC OXIDE  . Tee without cardioversion N/A 12/20/2014    Procedure: TRANSESOPHAGEAL ECHOCARDIOGRAM (TEE);  Surgeon: Ivin Poot, MD;  Location: [place];  Service: Open Heart Surgery;  Laterality: N/A;  . Sternal wound debridement N/A 08/27/2015    Procedure: WOUND  IRRIGATION AND DEBRIDEMENT;  Surgeon: Ivin Poot, MD;  Location: [place];  Service: Thoracic;  Laterality: N/A;  . Application of wound vac N/A 08/27/2015    Procedure: APPLICATION OF WOUND VAC;  Surgeon: Ivin Poot, MD;  Location: [place];  Service: Thoracic;  Laterality: N/A;  . Sternal wound debridement N/A 08/29/2015    Procedure: DEBRIDEMENT OF chest wound;  Surgeon: Ivin Poot, MD;  Location: [place];  Service: Thoracic;  Laterality: N/A;  . Application of wound vac N/A 08/29/2015    Procedure: WOUND VAC CHANGE;  Surgeon: Ivin Poot, MD;  Location: [place];  Service: Thoracic;  Laterality: N/A;  . Incision and drainage of wound N/A 09/05/2015    Procedure: IRRIGATION AND DEBRIDEMENT sternal WOUND;  Surgeon: Loel Lofty Dillingham, DO;  Location: [place];  Service: Plastics;  Laterality: N/A;  . Application of a-cell of extremity N/A 09/05/2015    Procedure: APPLICATION OF A-CELL OF sternum;  Surgeon: Loel Lofty Dillingham, DO;  Location: [place];  Service: Plastics;  Laterality: N/A;  . Application of wound vac N/A 09/05/2015    Procedure: APPLICATION OF WOUND VAC to sternum;  Surgeon: Loel Lofty Dillingham, DO;  Location: [place];  Service: Plastics;  Laterality: N/A;  . Insertion of dialysis catheter Right 09/05/2015    Procedure: INSERTION OF DIALYSIS CATHETER;RIGHT SUBCLAVIAN;  Surgeon: Loel Lofty Dillingham, DO;  Location: [place];  Service: Plastics;  Laterality: Right;  . Sternal closure N/A 09/17/2015    Procedure: ABDOMINAL WOUND CLOSURE WITH INCISIONAL VAC APPLICATION;  Surgeon: Ivin Poot, MD;  Location: [place];  Service: Thoracic;  Laterality: N/A;  . Esophagogastroduodenoscopy N/A 09/17/2015    Procedure: ESOPHAGOGASTRODUODENOSCOPY (EGD);  Surgeon: Carol Ada, MD;  Location: [place];  Service: Gastroenterology;  Laterality: N/A;  . Pectoralis flap N/A 09/09/2015    Procedure: DEBRIDEMENT AND CLOSURE WOUND WITH PLACEMENT OF ABRA CLOSURE DEVICE;  Surgeon: Loel Lofty Dillingham, DO;  Location:  [place];  Service: Plastics;  Laterality: N/A;    Family History  Problem Relation Age of Onset  . Heart failure Brother     Social History Social History  Substance Use Topics  . Smoking status: Never Smoker   . Smokeless tobacco: Never Used  . Alcohol Use: No    Current Outpatient Prescriptions  Medication Sig Dispense Refill  . allopurinol (ZYLOPRIM) 100 MG tablet Take 1 tablet (100 mg total) by mouth daily. 30 tablet 6  . bisacodyl (DULCOLAX) 5 MG EC tablet Take 2 tablets (10 mg total) by mouth daily as needed for moderate constipation (Give daily if no BM). (Patient not taking: Reported on 10/09/2015) 30 tablet 0  . cephALEXin (KEFLEX) 500 MG capsule Take 1 capsule (500 mg total) by mouth 3 (three) times daily. 30 capsule 1  . citalopram (CELEXA) 20 MG tablet Take 1 tablet (20 mg total) by mouth daily. 30 tablet 6  . docusate sodium (COLACE) 100 MG capsule Take 2  capsules (200 mg total) by mouth daily. (Patient not taking: Reported on 10/09/2015) 10 capsule 0  . furosemide (LASIX) 80 MG tablet Take 1 tablet (80 mg total) by mouth daily. 30 tablet 1  . metoCLOPramide (REGLAN) 5 MG tablet Take 1 tablet (5 mg total) by mouth every 6 (six) hours. (Patient taking differently: Take 5 mg by mouth every 6 (six) hours as needed. ) 120 tablet 0  . oxyCODONE (OXY IR/ROXICODONE) 5 MG immediate release tablet Take 1 tablet (5 mg total) by mouth every 3 (three) hours as needed for moderate pain or breakthrough pain. 90 tablet 0  . pregabalin (LYRICA) 75 MG capsule Take 1 capsule (75 mg total) by mouth daily. 60 capsule 11  . sildenafil (REVATIO) 20 MG tablet Take 1 tablet (20 mg total) by mouth 3 (three) times daily. 90 tablet 6  . simethicone (MYLICON) 80 MG chewable tablet Chew 1 tablet (80 mg total) by mouth 4 (four) times daily. (Patient taking differently: Chew 80 mg by mouth 4 (four) times daily as needed. ) 30 tablet 0  . tamsulosin (FLOMAX) 0.4 MG CAPS capsule Take 1 capsule (0.4 mg total) by  mouth daily. 30 capsule 0  . warfarin (COUMADIN) 4 MG tablet Take 1 tablet (4 mg total) by mouth one time only at 6 PM. 30 tablet 0  . zolpidem (AMBIEN) 10 MG tablet Take 10 mg by mouth at bedtime as needed for sleep.     No current facility-administered medications for this visit.    Allergies  Allergen Reactions  . Phytonadione Other (See Comments)    Patient has LVAD: please check with LVAD coordinator on call or LVAD MD on call before reversal of anticoagulation with vit k    Review of Systems  No fever No shortness of breath Symptoms of C. difficile colitis have resolved  BP   Ht 5\' 7"  (1.702 m)  Wt 161 lb 3.2 oz (73.12 kg)  BMI 25.24 kg/m2  SpO2  Physical Exam Alert and comfortable Lungs clear Machinery-like hum , over left  lower chest Small opening in the lower sternal incision cleaned, packed with   thin aquacell silver strip , and dry dressing applied No peripheral edema  Diagnostic Tests: Last creatinine 2.6, significant improvement  Impression: Chronic infection of the pump pocket with a small sinus tract which is draining Continue to pack the wound with thin strips of silver Continue suppressive dose of Keflex Referred for transplant evaluation when appropriate Would not recommend pump exchange-anything short of pump removal would lead to recurrent infection  Plan: Patient will return to the VAD clinic for follow-up.   Len Childs, MD [facility] [phone]

## 2015-10-23 ENCOUNTER — Encounter (HOSPITAL_COMMUNITY): Payer: Self-pay | Admitting: Infectious Diseases

## 2015-10-23 ENCOUNTER — Ambulatory Visit: Payer: 59 | Admitting: Cardiothoracic Surgery

## 2015-10-24 ENCOUNTER — Ambulatory Visit (HOSPITAL_COMMUNITY): Payer: Self-pay | Admitting: *Deleted

## 2015-10-24 ENCOUNTER — Other Ambulatory Visit (HOSPITAL_COMMUNITY): Payer: Self-pay | Admitting: *Deleted

## 2015-10-24 ENCOUNTER — Ambulatory Visit (HOSPITAL_COMMUNITY)
Admission: RE | Admit: 2015-10-24 | Discharge: 2015-10-24 | Disposition: A | Discharge status: Home / Self Care | Payer: 59 | Source: Ambulatory Visit | Attending: Internal Medicine | Admitting: Internal Medicine

## 2015-10-24 VITALS — BP 96/0 | HR 91 | Wt 160.0 lb

## 2015-10-24 DIAGNOSIS — I5023 Acute on chronic systolic (congestive) heart failure: Secondary | ICD-10-CM

## 2015-10-24 DIAGNOSIS — T148 Other injury of unspecified body region: Secondary | ICD-10-CM | POA: Insufficient documentation

## 2015-10-24 DIAGNOSIS — X58XXXA Exposure to other specified factors, initial encounter: Secondary | ICD-10-CM | POA: Diagnosis not present

## 2015-10-24 DIAGNOSIS — T148XXA Other injury of unspecified body region, initial encounter: Secondary | ICD-10-CM

## 2015-10-24 DIAGNOSIS — Z95811 Presence of heart assist device: Secondary | ICD-10-CM

## 2015-10-24 DIAGNOSIS — Z5181 Encounter for therapeutic drug level monitoring: Secondary | ICD-10-CM

## 2015-10-24 DIAGNOSIS — L089 Local infection of the skin and subcutaneous tissue, unspecified: Secondary | ICD-10-CM

## 2015-10-24 DIAGNOSIS — Z7901 Long term (current) use of anticoagulants: Secondary | ICD-10-CM

## 2015-10-24 LAB — BASIC METABOLIC PANEL
Anion gap: 7 (ref 5–15)
BUN: 48 mg/dL — AB (ref 6–20)
CALCIUM: 9 mg/dL (ref 8.9–10.3)
CHLORIDE: 107 mmol/L (ref 101–111)
CO2: 21 mmol/L — AB (ref 22–32)
CREATININE: 2.58 mg/dL — AB (ref 0.61–1.24)
GFR calc Af Amer: 32 mL/min — ABNORMAL LOW (ref >60)
GFR calc non Af Amer: 28 mL/min — ABNORMAL LOW (ref >60)
GLUCOSE: 92 mg/dL (ref 65–99)
Potassium: 4.2 mmol/L (ref 3.5–5.1)
Sodium: 135 mmol/L (ref 135–145)

## 2015-10-24 LAB — CBC
HCT: 30.4 % — ABNORMAL LOW (ref 39.0–52.0)
Hemoglobin: 9.3 g/dL — ABNORMAL LOW (ref 13.0–17.0)
MCH: 24.2 pg — AB (ref 26.0–34.0)
MCHC: 30.6 g/dL (ref 30.0–36.0)
MCV: 79 fL (ref 78.0–100.0)
PLATELETS: 156 10*3/uL (ref 150–400)
RBC: 3.85 MIL/uL — ABNORMAL LOW (ref 4.22–5.81)
RDW: 17.4 % — ABNORMAL HIGH (ref 11.5–15.5)
WBC: 7.5 10*3/uL (ref 4.0–10.5)

## 2015-10-24 LAB — PROTIME-INR
INR: 2.34 — ABNORMAL HIGH (ref 0.00–1.49)
PROTHROMBIN TIME: 25.4 s — AB (ref 11.6–15.2)

## 2015-10-24 NOTE — Progress Notes (Signed)
Vital Signs: Doppler modified systolic: 96 Automatic BP:  97/71 (81) HR:  91 SPO2: 99%  VAD interrogation: Speed:   9200 Flow:  4.2 Power:  5.1 PI: 6.1   Fixed speed: 9200 Low speed limit: 8600  Primary Controller:  Replace back up battery in 22 months. Back up controller:   Replace back up battery in 22  Months.  Dr. Prescott Gum completed sternal wound dressing change.  Packed site with aquacel silver strip, covered with gauze dressing. Site with large amount drainage; wound culture obtained and sent to lab. Pt remains on Keflex 500 mg tid. Pt will be coming to VAD clinic on MWF for sternal wound pack and dressing change.     VAD dressing removed and site care performed using sterile technique. Drive line exit site cleaned with Chlora prep applicators x 2, allowed to dry, and Sorbaview dressing with Biopatch re-applied. Exit site completely healed, the velour is fully implanted at exit site. No tenderness, drainage, or foul odor noted. Drive line anchor re-applied. Pt denies fever or chills. Driveline dressing is being changed weekly per sterile technique per VAD coordinator.   Pt/caregiver deny any alarms or VAD equipment issues.

## 2015-10-25 ENCOUNTER — Encounter (HOSPITAL_COMMUNITY): Payer: Self-pay | Admitting: Infectious Diseases

## 2015-10-25 ENCOUNTER — Ambulatory Visit (HOSPITAL_COMMUNITY)
Admission: RE | Admit: 2015-10-25 | Discharge: 2015-10-25 | Disposition: A | Discharge status: Home / Self Care | Payer: 59 | Source: Ambulatory Visit | Attending: Internal Medicine | Admitting: Internal Medicine

## 2015-10-25 DIAGNOSIS — Z4800 Encounter for change or removal of nonsurgical wound dressing: Secondary | ICD-10-CM | POA: Diagnosis not present

## 2015-10-25 DIAGNOSIS — Z95811 Presence of heart assist device: Secondary | ICD-10-CM | POA: Insufficient documentation

## 2015-10-25 NOTE — Progress Notes (Signed)
Pt presented for sternal wound dressing change today. Per Dr. Darcey Nora, site will be packed with Iodoform strip with dressing change every MWF.  Dressing removed with large amount grayish thick drainage noted; no redness, tenderness, or foul odor noted. Aquacel silver attached to dressing and removed. Sinus tract remains open and packed with 1" Iodoform strip beneath the skin on top of the titanium pump. This will continue Monday Wednesday Friday in [place] clinic.  Pt has f/u appointment with Plastic Surgery Monday, 10/28/15 and wound will be assessed there. Instructed pt to return next Wednesday for wound care here. Pt verbalized understanding of same.   Preliminary wound culture results from yesterday are negative to date.

## 2015-10-28 DIAGNOSIS — A047 Enterocolitis due to Clostridium difficile: Secondary | ICD-10-CM | POA: Diagnosis not present

## 2015-10-28 DIAGNOSIS — A4901 Methicillin susceptible Staphylococcus aureus infection, unspecified site: Secondary | ICD-10-CM | POA: Diagnosis not present

## 2015-10-28 DIAGNOSIS — Z452 Encounter for adjustment and management of vascular access device: Secondary | ICD-10-CM | POA: Diagnosis not present

## 2015-10-28 DIAGNOSIS — T827XXA Infection and inflammatory reaction due to other cardiac and vascular devices, implants and grafts, initial encounter: Secondary | ICD-10-CM | POA: Diagnosis not present

## 2015-10-28 LAB — AEROBIC CULTURE W GRAM STAIN (SUPERFICIAL SPECIMEN): Culture: NO GROWTH

## 2015-10-30 ENCOUNTER — Encounter (HOSPITAL_COMMUNITY): Payer: Self-pay | Admitting: *Deleted

## 2015-10-30 ENCOUNTER — Ambulatory Visit (HOSPITAL_COMMUNITY)
Admission: RE | Admit: 2015-10-30 | Discharge: 2015-10-30 | Disposition: A | Discharge status: Home / Self Care | Payer: 59 | Source: Ambulatory Visit | Attending: Cardiology | Admitting: Cardiology

## 2015-10-30 VITALS — BP 106/0 | HR 84

## 2015-10-30 DIAGNOSIS — Z95811 Presence of heart assist device: Secondary | ICD-10-CM | POA: Diagnosis not present

## 2015-10-30 NOTE — Progress Notes (Signed)
Driveline Dressing Change:  Drive line is being maintained by VAD coordinator. Denies any fevers, chills, drainage or foul odor. VAD dressing removed and site care performed using sterile technique. Drive line exit site cleaned with Chlora prep applicators x 2, allowed to dry, and Sorbaview dressing with biopatch on exit site applied. Exit site with complete tissue ingrowth, the velour is fully implanted at exit site. No redness, tenderness, drainage, or foul odor noted. New anchor device attached. Driveline dressing is being changed weekly per sterile technique.   Sternal Dressing: Tegaderm dressing in place. Pt saw Dr. Marla Roe (Plastics) this past Monday, 10/28/15 and had wound check. Dr. Marla Roe reported to Darrick Grinder, NP that she packed wound with Acell and feels pt may not need skin flap. Dr. Darcey Nora in today to check sternal wound and advised not to change.  Pt has f/u with Dr. Marla Roe Monday, 11/04/15.   Pt reports he had appointment with nephrology last week and was told his BP "was a little low"; has f/u appt today for BP check. BP obtained today; pt will call nephrology with same:  Doppler modified systolic:  [id number]  Auto cuff BP:  103/79 (88)   Zada Girt, RN, BSN  VAD Coordinator  Office: [phone] 24/7 VAD Pager: [phone]

## 2015-10-31 ENCOUNTER — Inpatient Hospital Stay (HOSPITAL_BASED_OUTPATIENT_CLINIC_OR_DEPARTMENT_OTHER)
Admission: RE | Admit: 2015-10-31 | Discharge: 2015-10-31 | Disposition: A | Discharge status: Home / Self Care | Payer: 59 | Source: Ambulatory Visit

## 2015-10-31 DIAGNOSIS — Z4800 Encounter for change or removal of nonsurgical wound dressing: Secondary | ICD-10-CM

## 2015-10-31 DIAGNOSIS — Z95811 Presence of heart assist device: Secondary | ICD-10-CM | POA: Diagnosis not present

## 2015-10-31 NOTE — Progress Notes (Addendum)
Pt called VAD clinic to report drainage from sternal wound site. States it is soaking through to his clothing. Instructed to come to clinic for wound check.  Sternal Dressing: Tegaderm dressing in place with light brown drainage seeping from edges. Pt saw Dr. Marla Roe (Plastics) this past Monday, 10/28/15 and had wound packed wound with Acell and covered with Tegaderm. Dressing changed, unable to pack with Iodoform strip, site too small. Filled with small amount Acell, site covered with gauze dressing.     Sternal wound culture from 10/24/15 reveal no growth (final). Pt denies any fever or chills.   Pt has f/u with Dr. Marla Roe Monday, 11/04/15. Asked pt to have MD call VAD pager with f/u instructions.    Zada Girt, RN, BSN  VAD Coordinator  Office: [phone] 24/7 [place] Pager: [phone]

## 2015-11-06 ENCOUNTER — Ambulatory Visit (HOSPITAL_COMMUNITY)
Admission: RE | Admit: 2015-11-06 | Discharge: 2015-11-06 | Disposition: A | Discharge status: Home / Self Care | Payer: 59 | Source: Ambulatory Visit | Attending: Cardiology | Admitting: Cardiology

## 2015-11-06 ENCOUNTER — Ambulatory Visit (HOSPITAL_COMMUNITY): Payer: Self-pay | Admitting: Infectious Diseases

## 2015-11-06 DIAGNOSIS — T148 Other injury of unspecified body region: Secondary | ICD-10-CM | POA: Diagnosis not present

## 2015-11-06 DIAGNOSIS — T148XXA Other injury of unspecified body region, initial encounter: Secondary | ICD-10-CM

## 2015-11-06 DIAGNOSIS — X58XXXA Exposure to other specified factors, initial encounter: Secondary | ICD-10-CM | POA: Insufficient documentation

## 2015-11-06 DIAGNOSIS — L089 Local infection of the skin and subcutaneous tissue, unspecified: Secondary | ICD-10-CM | POA: Insufficient documentation

## 2015-11-06 DIAGNOSIS — Z95811 Presence of heart assist device: Secondary | ICD-10-CM | POA: Diagnosis not present

## 2015-11-06 DIAGNOSIS — R339 Retention of urine, unspecified: Secondary | ICD-10-CM

## 2015-11-06 MED ORDER — CEPHALEXIN 500 MG PO CAPS: 500.0000 mg | ORAL_CAPSULE | Freq: Three times a day (TID) | ORAL | Status: DC

## 2015-11-06 MED ORDER — WARFARIN SODIUM 4 MG PO TABS: 4.0000 mg | ORAL_TABLET | Freq: Once | ORAL | Status: DC

## 2015-11-06 MED ORDER — TAMSULOSIN HCL 0.4 MG PO CAPS: 0.4000 mg | ORAL_CAPSULE | Freq: Every day | ORAL | Status: DC

## 2015-11-06 NOTE — Progress Notes (Signed)
Pt called VAD clinic for wound assessment and DL dressing change.   LVAD Exit Site: Well healed and incorporated. The velour is fully implanted at exit site. Dressing dry and intact. No erythema or drainage. Stabilization device present and accurately applied. Driveline dressing is being changed weekly per sterile technique using Sorbaview dressing with biopatch on exit site. Pt denies fever or chills.   Sternal Dressing: Tegaderm dressing in place with light brown drainage seeping from edges. Pt saw Dr. Marla Roe (Plastics) Monday - no new Acell placed. Dr. Prescott Gum in today to assess. Inserted Q-Tip into tract and cauterized bleeding tissue with 3 silver nitrate sticks. Advised to change with 2x2 gauze if it is saturated so drainage will be wicked appropriately - likely once daily based on saturation of drainage. Plan to keep open while draining. Continue Keflex.   Re-assess kidney function next visit with INR. Send packet to [facility] for transplant evaluation.   [name], RN VAD Coordinator   Office: [phone] 24/7 Emergency VAD Pager: [phone]

## 2015-11-06 NOTE — Progress Notes (Signed)
A user error has taken place: encounter opened in error, closed for administrative reasons.

## 2015-11-11 ENCOUNTER — Other Ambulatory Visit (HOSPITAL_COMMUNITY): Payer: Self-pay | Admitting: Cardiothoracic Surgery

## 2015-11-11 DIAGNOSIS — L089 Local infection of the skin and subcutaneous tissue, unspecified: Secondary | ICD-10-CM

## 2015-11-11 DIAGNOSIS — T148XXA Other injury of unspecified body region, initial encounter: Secondary | ICD-10-CM

## 2015-11-11 DIAGNOSIS — Z95811 Presence of heart assist device: Secondary | ICD-10-CM

## 2015-11-12 ENCOUNTER — Telehealth (HOSPITAL_COMMUNITY): Payer: Self-pay | Admitting: Infectious Diseases

## 2015-11-12 ENCOUNTER — Other Ambulatory Visit (HOSPITAL_COMMUNITY): Payer: Self-pay | Admitting: Infectious Diseases

## 2015-11-12 MED ORDER — METOCLOPRAMIDE HCL 5 MG PO TABS: 5.0000 mg | ORAL_TABLET | Freq: Four times a day (QID) | ORAL | 3 refills | Status: DC

## 2015-11-12 NOTE — Telephone Encounter (Signed)
Call re: needing refills for metoclopramide and keflex. Called pharmacy to verify refill needed. Keflex is ready (was sent electronically at last clinic visit with Dr. Prescott Gum) and is ready for pick up. Reglan refill called in for #30 with 3 refills. He is taking this medication QID scheduled - was started in the hospital for intractible vomiting and gastric spasm. Will need to d/w MD regarding if patient needs FU with GI outpatient or if we can start weaning him off this med since there are side effects (QT prolongation and EPS) associated with long term use.   [name], RN VAD Coordinator   Office: [phone] 24/7 Emergency VAD Pager: [phone]

## 2015-11-13 ENCOUNTER — Other Ambulatory Visit (HOSPITAL_COMMUNITY): Payer: Self-pay | Admitting: *Deleted

## 2015-11-13 DIAGNOSIS — Z95811 Presence of heart assist device: Secondary | ICD-10-CM

## 2015-11-13 DIAGNOSIS — Z7901 Long term (current) use of anticoagulants: Secondary | ICD-10-CM

## 2015-11-14 ENCOUNTER — Ambulatory Visit (HOSPITAL_COMMUNITY)
Admission: RE | Admit: 2015-11-14 | Discharge: 2015-11-14 | Disposition: A | Discharge status: Home / Self Care | Payer: 59 | Source: Ambulatory Visit | Attending: Cardiology | Admitting: Cardiology

## 2015-11-14 DIAGNOSIS — N179 Acute kidney failure, unspecified: Secondary | ICD-10-CM | POA: Insufficient documentation

## 2015-11-14 DIAGNOSIS — Z95811 Presence of heart assist device: Secondary | ICD-10-CM

## 2015-11-14 NOTE — Progress Notes (Signed)
Pt called VAD clinic for sternal wound assessment and DL dressing change.   LVAD Exit Site: Well healed and incorporated. The velour is fully implanted at exit site. Dressing dry and intact. No erythema or drainage. Stabilization device present and accurately applied. Driveline dressing is being changed weekly per sterile technique using Sorbaview dressing with biopatch on exit site. Pt denies fever or chills.   Sternal Dressing: Gauze and tape dressing removed. Dressings have been changed every other day by [name]. Has not changed since Monday. Still with drainage. Inserted Q-Tip into tract approximately 1 cm deep with return of more mucoid drainage pooling to wound bed. Minimal bleeding noted after procedure. No cauterization needed today. Advised to change every day over weekend until appt Monday since we can expect more drainage with re-opening of tract. Continue Keflex per Dr. Lucianne Lei Trigt's previous instructions (Rx current).     Labs will be done Monday since he has a VAD MD appointment then.   HeartMate II System Controller SW Version 7.29 upgrade performed in clinic today to accommodate new recommendations from [employer]. Consent reviewed and signed. Tem Tech representative Blair Heys) performed software upgrade. VAD Team performed elective controller exchange. Patient tolerated controller exchange well with only mild dizziness while supine. Pump restarted as expected with stable VAD parameters on correct prescribed speed of 9200 / 8600 rpms.    Primary: [id number] Back up: [id number]  Both back up batteries have 40m until replacement of 11V battery.   [name], RN VAD Coordinator   Office: [phone] 24/7 Emergency VAD Pager: [phone]

## 2015-11-18 ENCOUNTER — Other Ambulatory Visit (HOSPITAL_COMMUNITY): Payer: Self-pay | Admitting: Unknown Physician Specialty

## 2015-11-18 ENCOUNTER — Ambulatory Visit (HOSPITAL_COMMUNITY): Payer: Self-pay | Admitting: Unknown Physician Specialty

## 2015-11-18 ENCOUNTER — Other Ambulatory Visit: Payer: Self-pay | Admitting: Unknown Physician Specialty

## 2015-11-18 ENCOUNTER — Other Ambulatory Visit (HOSPITAL_COMMUNITY): Payer: Self-pay | Admitting: Internal Medicine

## 2015-11-18 ENCOUNTER — Ambulatory Visit (HOSPITAL_COMMUNITY)
Admission: RE | Admit: 2015-11-18 | Discharge: 2015-11-18 | Disposition: A | Discharge status: Home / Self Care | Payer: 59 | Source: Ambulatory Visit | Attending: Cardiology | Admitting: Cardiology

## 2015-11-18 VITALS — BP 88/0 | HR 88 | Ht 67.0 in | Wt 169.0 lb

## 2015-11-18 DIAGNOSIS — L089 Local infection of the skin and subcutaneous tissue, unspecified: Secondary | ICD-10-CM

## 2015-11-18 DIAGNOSIS — I429 Cardiomyopathy, unspecified: Secondary | ICD-10-CM

## 2015-11-18 DIAGNOSIS — Z95811 Presence of heart assist device: Secondary | ICD-10-CM

## 2015-11-18 DIAGNOSIS — I5022 Chronic systolic (congestive) heart failure: Secondary | ICD-10-CM | POA: Diagnosis not present

## 2015-11-18 DIAGNOSIS — Z95810 Presence of automatic (implantable) cardiac defibrillator: Secondary | ICD-10-CM

## 2015-11-18 DIAGNOSIS — Z8674 Personal history of sudden cardiac arrest: Secondary | ICD-10-CM | POA: Diagnosis not present

## 2015-11-18 DIAGNOSIS — D62 Acute posthemorrhagic anemia: Secondary | ICD-10-CM

## 2015-11-18 DIAGNOSIS — E876 Hypokalemia: Secondary | ICD-10-CM | POA: Insufficient documentation

## 2015-11-18 DIAGNOSIS — M109 Gout, unspecified: Secondary | ICD-10-CM | POA: Diagnosis not present

## 2015-11-18 DIAGNOSIS — I428 Other cardiomyopathies: Secondary | ICD-10-CM | POA: Diagnosis not present

## 2015-11-18 DIAGNOSIS — M069 Rheumatoid arthritis, unspecified: Secondary | ICD-10-CM | POA: Insufficient documentation

## 2015-11-18 DIAGNOSIS — Z7901 Long term (current) use of anticoagulants: Secondary | ICD-10-CM

## 2015-11-18 DIAGNOSIS — I480 Paroxysmal atrial fibrillation: Secondary | ICD-10-CM

## 2015-11-18 DIAGNOSIS — I110 Hypertensive heart disease with heart failure: Secondary | ICD-10-CM | POA: Insufficient documentation

## 2015-11-18 DIAGNOSIS — T148 Other injury of unspecified body region: Secondary | ICD-10-CM

## 2015-11-18 DIAGNOSIS — I482 Chronic atrial fibrillation, unspecified: Secondary | ICD-10-CM

## 2015-11-18 DIAGNOSIS — Z79899 Other long term (current) drug therapy: Secondary | ICD-10-CM | POA: Insufficient documentation

## 2015-11-18 DIAGNOSIS — D649 Anemia, unspecified: Secondary | ICD-10-CM | POA: Insufficient documentation

## 2015-11-18 DIAGNOSIS — N179 Acute kidney failure, unspecified: Secondary | ICD-10-CM | POA: Diagnosis not present

## 2015-11-18 DIAGNOSIS — E46 Unspecified protein-calorie malnutrition: Secondary | ICD-10-CM | POA: Insufficient documentation

## 2015-11-18 DIAGNOSIS — I472 Ventricular tachycardia, unspecified: Secondary | ICD-10-CM

## 2015-11-18 DIAGNOSIS — T148XXA Other injury of unspecified body region, initial encounter: Secondary | ICD-10-CM

## 2015-11-18 DIAGNOSIS — L02211 Cutaneous abscess of abdominal wall: Secondary | ICD-10-CM

## 2015-11-18 LAB — BASIC METABOLIC PANEL
Anion gap: 7 (ref 5–15)
BUN: 21 mg/dL — ABNORMAL HIGH (ref 6–20)
CALCIUM: 8 mg/dL — AB (ref 8.9–10.3)
CO2: 25 mmol/L (ref 22–32)
CREATININE: 1.96 mg/dL — AB (ref 0.61–1.24)
Chloride: 106 mmol/L (ref 101–111)
GFR, EST AFRICAN AMERICAN: 45 mL/min — AB (ref >60)
GFR, EST NON AFRICAN AMERICAN: 39 mL/min — AB (ref >60)
GLUCOSE: 91 mg/dL (ref 65–99)
Potassium: 2.9 mmol/L — ABNORMAL LOW (ref 3.5–5.1)
Sodium: 138 mmol/L (ref 135–145)

## 2015-11-18 LAB — LACTATE DEHYDROGENASE: LDH: 208 U/L — ABNORMAL HIGH (ref 98–192)

## 2015-11-18 LAB — CBC
HCT: 30.3 % — ABNORMAL LOW (ref 39.0–52.0)
Hemoglobin: 9.7 g/dL — ABNORMAL LOW (ref 13.0–17.0)
MCH: 23.6 pg — AB (ref 26.0–34.0)
MCHC: 32 g/dL (ref 30.0–36.0)
MCV: 73.7 fL — ABNORMAL LOW (ref 78.0–100.0)
PLATELETS: 196 10*3/uL (ref 150–400)
RBC: 4.11 MIL/uL — AB (ref 4.22–5.81)
RDW: 16.9 % — ABNORMAL HIGH (ref 11.5–15.5)
WBC: 7.7 10*3/uL (ref 4.0–10.5)

## 2015-11-18 LAB — PROTIME-INR
INR: 2.19
PROTHROMBIN TIME: 24.7 s — AB (ref 11.4–15.2)

## 2015-11-18 MED ORDER — POTASSIUM CHLORIDE CRYS ER 20 MEQ PO TBCR: 40.0000 meq | EXTENDED_RELEASE_TABLET | ORAL | 6 refills | Status: DC

## 2015-11-18 MED ORDER — FUROSEMIDE 80 MG PO TABS: 80.0000 mg | ORAL_TABLET | ORAL | Status: DC

## 2015-11-18 MED ORDER — FERROUS SULFATE 324 (65 FE) MG PO TBEC: 1.0000 | DELAYED_RELEASE_TABLET | Freq: Two times a day (BID) | ORAL | Status: DC

## 2015-11-18 MED ORDER — PREGABALIN 75 MG PO CAPS: 75.0000 mg | ORAL_CAPSULE | Freq: Two times a day (BID) | ORAL | Status: DC

## 2015-11-18 NOTE — Progress Notes (Signed)
Patient ID: [name], male   DOB: 09-17-1970, 45 y.o.   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  Admitted from [facility] in [place] in 5/17 with LVAD driveline abscess. Hospitalization was complicated by septic shock, MSSA bacteremia, CDiff, and AKI. Prior to admit blood cultures from [facility] showed MSSA and C diff. Cardiac Surgery and ID consulted on admit.  He was taken to the operating room on May 9th for I and D of epigastric wound. Exposed LVAD hardware was noted. He returned to the OR May 11th for I and D and wound VAC placement. He also had a TEE that did not show any vegetation on valves or ICD wires. VAC later removed due to inability to seal. At that point he had sterile dressing changes. Plastic Surgery was consulted for complex wound with exposed hardware. On May 22nd he returned to the OR with ABRA wound closure device and application of ACell. Over the next several days ABRA device was tightened. On May 30th he returned to the OR for closure of abdominal wound. Over the course of his hospitalization he required multiple blood products due to symptomatic anemia and blood loss from abdominal wound.   ID followed closely through out his hospitalization. He was placed on broad spectrum antibiotics with adjustments made based on blood cultures. He was being treated for MSSA bacteremia, C diff, and klebseilla PNA. He has now completed Ancef, oral Vanc, and Rifampin. Urine with >100K yeast while in hospital and completed course of fluconazole.   Due to septic shock he developed AKI with after having MAPs down to the 40s. Initially requiring pressors and eventually weaned off. Nephrology was consulted and started CVVHD  and later transitioned to intermittent HD. Gradually he started making urine and on the day of discharge he was urinating over 2 liters a day.   Due to severe deconditioning PT consulted with recommendations for CIR. On 09/25/15, he was transferred to CIR in stable condition. He was continued on coumadin with INR goal 2-2.5.   He returns for LVAD follow up. Overall feeling good. Wants to go back to work. Denies SOB/PND/Orthopnea. Able to walk the dog 4 times a day. Weight at home 155-156. No fever. Says he hasnt changed the abdominal dressing in 1 week. VAD dressing changed weekly in the clinic. No fever or chills. Has follow up with Dr Marla Roe today. Taking all medications.   Denies LVAD alarms.  Denies driveline trauma, erythema or drainage.  Denies ICD shocks.   Reports taking Coumadin as prescribed and adherence to anticoagulation based dietary restrictions.  Denies bright red blood per rectum or melena, no dark urine or hematuria.    Labs (5/17): K 5.5, creatinine 6.49 => 4.52, hgb 9, LDH 230, INR 2.38 Albs 10/24/2015: Creatinine 2.58    Past Medical History:  Diagnosis Date  . AICD (automatic cardioverter/defibrillator) present   . AKI (acute kidney injury) ([place])   . Atrial fibrillation -parosysmal     Rx w amiodarone  . Automatic implantable cardiac defibrillator -BSX    single chamber  . CHF (congestive heart failure) ([place])   . Chronic systolic heart failure (HCC)    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%)  . Elevated LFTs   .  GI bleed -massive    11 Units 2012  . Gout   . H/O hyperthyroidism   . Hypertension   . Noncompliance    H/O  MEDICAL NONCOMPLIANCE  . 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 Polymorphic ventricular tachycardia (HCC)    RECURRENT WITH APPROPRIATE SHOCK THERAPY IN THE PAST  . RA (rheumatoid arthritis) ([place])   . Severe mitral regurgitation   . Tricuspid valve regurgitation    SEVERE  . Ventricular  fibrillation ([place])    WITH APPROPRIATE SHOCK THERAPY IN THE PAST    Current Outpatient Prescriptions  Medication Sig Dispense Refill  . allopurinol (ZYLOPRIM) 100 MG tablet Take 1 tablet (100 mg total) by mouth daily. 30 tablet 6  . cephALEXin (KEFLEX) 500 MG capsule Take 1 capsule (500 mg total) by mouth 3 (three) times daily. 90 capsule 1  . citalopram (CELEXA) 20 MG tablet Take 1 tablet (20 mg total) by mouth daily. 30 tablet 6  . furosemide (LASIX) 80 MG tablet Take 1 tablet (80 mg total) by mouth daily. (Patient taking differently: Take 80 mg by mouth 2 (two) times a week. ) 30 tablet 1  . oxyCODONE (OXY IR/ROXICODONE) 5 MG immediate release tablet Take 1 tablet (5 mg total) by mouth every 3 (three) hours as needed for moderate pain or breakthrough pain. 90 tablet 0  . sildenafil (REVATIO) 20 MG tablet Take 1 tablet (20 mg total) by mouth 3 (three) times daily. 90 tablet 6  . tamsulosin (FLOMAX) 0.4 MG CAPS capsule Take 1 capsule (0.4 mg total) by mouth daily. 30 capsule 3  . warfarin (COUMADIN) 4 MG tablet Take 1 tablet (4 mg total) by mouth one time only at 6 PM. 30 tablet 11  . zolpidem (AMBIEN) 10 MG tablet Take 10 mg by mouth at bedtime as needed for sleep.    . pregabalin (LYRICA) 75 MG capsule Take 1 capsule (75 mg total) by mouth daily. (Patient taking differently: Take 75 mg by mouth 2 (two) times daily. ) 60 capsule 11  . simethicone (MYLICON) 80 MG chewable tablet Chew 1 tablet (80 mg total) by mouth 4 (four) times daily. (Patient not taking: Reported on 11/18/2015) 30 tablet 0   No current facility-administered medications for this encounter.     Phytonadione  REVIEW OF SYSTEMS: All systems negative except as listed in HPI, PMH and Problem list.   VAD interrogation & Equipment Management:  LVAD Flow 4.9 Speed 9200 Power 5.6 PI 7.1 Rare PI  Vital Signs:  BP (!) 88/0 Comment: modified doppler systolic  Pulse 88   Ht 5\' 7"  (1.702 m)   Wt 169 lb (76.7 kg)   SpO2 96%    BMI 26.47 kg/m  MAP 88  Physical Exam: GENERAL: Well appearing, male  HEENT: normal  NECK: Supple, JVP flat No lymphadenopathy or thyromegaly appreciated.   CARDIAC:  Mechanical heart sounds with LVAD hum present.  LUNGS:  Clear to auscultation bilaterally.  ABDOMEN:  Soft, round, nontender, positive bowel sounds x4.   Dressing intact   LVAD exit site:  Site is closing but there is still some drainage.  EXTREMITIES:  Warm and dry, no cyanosis, clubbing, rash or edema  NEUROLOGIC:  Alert and oriented x 4.  Gait steady.  No aphasia.  No dysarthria.  Affect pleasant.      ASSESSMENT AND PLAN:   1. Chronic systolic HF: Status post HMII LVAD implantation 12/20/14.  Now status post long  hospitalization as complication of driveline infection.  NYHA I-II. Volume status stable. Continue Lasix 80 mg twice a week. I will add 40 meq of potassium on days he takes lasix.  Still with purulent exudate. Iodoform inserted in the space. I will follow up with ID today.  - Driveline site will be seen by Dr Marla Roe today.    2. RV failure:  Now off milrinone.  Continue sildenafil.  Off digoxin with renal failure.  3. HTN: MAP stable off antihypertensives.  4. Atrial fibrillation: Paroxysmal.  Regular pulse.   On coumadin for VAD. He is no longer on amiodarone.  5. Anticoagulation management: INR goal 2.0-2.5, no aspirin.    6. AKI: Good UOP on Lasix 80 mg daily.  Creatinine starting to come down.  He has followup with nephrology. Repeat BMET with suspicion of hemolysis with high K.  7. Driveline infection: See above regarding site drainage.  He is on day 12/14 keflex.  With ongoing purulent exudate may need to switch antibiotics.  I will follow up with ID.  8. Anemia: Hemoglobin stable.  Will add po iron.  9. Malnutrition: Appetite much improved. Check Prealbumin.  10. Hypokalemia- Take 40 meq potassium today then 40 meq potassium on lasix day.  BMET today - Creatinine 1.96, K 2.9  LDH 208 . WBC 7.7    Follow up in 4 weeks.     Amy Clegg NP-C  11/18/2015    Symptom Yes No Details  Angina  x Activity:  Claudication  x How far:  Syncope  x When:  Stroke  x   Orthopnea  x How many pillows:  PND  x How often:  CPAP  x How many hrs:  Pedal edema  x   Abd fullness  x   N&V  x   Diaphoresis  x When:  Bleeding  x   Urine  x   SOB  x Activity:  Palpitations  x When:  ICD shock  x   Hospitlizaitons  x When/where/why:  ED visit  x When/where/why:  Other MD  x When/who/why:  Activity  x Walking dog 4x a day  Fluid  x   Diet  x    Vital Signs: Doppler BP: 88 Automatic BP: 85/60 (70) HR: 88 SPO2: 96%  Weight: 169 lbs Home weights: 160s Last weight:  160 lbs  VAD interrogation & Equipment Management  Speed:  9200 Flow:  4.9 Power: 5.6w PI: 7.1  Alarms:  none Events:  0 - 2 every few days  Fixed speed: 9200 Low speed limit: 8600  Primary Controller:  Replace back up battery in  37months. Back up controller:   Replace back up battery in  22 months.  Annual Equipment Maintenance on UBC/PM needed next visit.   I reviewed the LVAD parameters from today and compared the results to the patient's prior recorded data.  LVAD interrogation was NEGATIVE for significant power changes, NEGATIVE for clinical alarms and STABLE for PI events/speed drops. No programming changes were made and pump is functioning within specified parameters. Pt did have pump off/driveline disconnect on 7/27 for controller change out/upgrade in clinic last week.   LVAD equipment check completed and is in good working order. Back-up equipment present. LVAD education done on emergency procedures and precautions and reviewed exit site care.   VAD coordinator reviewed daily log from home for daily temperature, weight, and VAD parameters. Pt is performing daily controller and system monitor self tests along with completing weekly and monthly maintenance  for LVAD  equipment.    Exit Site Care: Drive line is being maintained weekly by VAD Coordinators. Pt/caregiver deny any alarms or VAD equipment issues.  Drive line exit site well healed and incorporated. The velour is fully implanted at exit site. Dressing dry and intact. No erythema or drainage. Stabilization device present and accurately applied. Driveline dressing is being changed weekly per sterile technique using Sorbaview dressing with biopatch on exit site. Pt denies fever or chills.   Sternal Wound Care: Wound is being maintained by pt and plastic Dr. Marla Roe. Pt has small amount of drainage on the bandage, bandage was last changed on Thursday. Pt is scheduled to see Dr. Marla Roe today at 230pm. Dressing was removed, there was small amount of yellow-brownish drainage on the dressing. A moderate amount of serous drainage was expressed from the site. Wound tunnels approx 1 cm. Wound packed with idoform and dressed by Darrick Grinder NP.       Encounter Details: On PO keflex for h/o MSSA will complete in 2 days.     MAPs 88 - 92 today with doppler reflecting systolic.   Labs: INR 2.19 today. Anticoagulation management includes INR goal of 2 - 2.5 with no ASA. See anticoagulation flow sheet.   Hgb 9.7 today - stable since D/C on current anticoagulation. LDH slightly higher at 330 with established baseline of 250 - 300. Denies tea-colored urine.   Creatinine 1.96   Prealbumin pending.   Medication Changes at this encounter:  1. Stop Reglan. 2. Stop keflex after 2 more days of therapy.  3. Take 40 mEq of Potassium today and on the days that you take Lasix.   RTC in 1 week  for dressing change and BMET, in one month for full visit with annual maintenance.    Tanda Rockers, RN VAD Coordinator   Office: [phone] 24/7 VAD Pager: [phone]

## 2015-11-18 NOTE — Progress Notes (Signed)
Symptom Yes No Details  Angina  x Activity:  Claudication  x How far:  Syncope  x When:  Stroke  x   Orthopnea  x How many pillows:  PND  x How often:  CPAP  x How many hrs:  Pedal edema  x   Abd fullness  x   N&V  x   Diaphoresis  x When:  Bleeding  x   Urine  x   SOB  x Activity:  Palpitations  x When:  ICD shock  x   Hospitlizaitons  x When/where/why:  ED visit  x When/where/why:  Other MD  x When/who/why:  Activity  x Walking dog 4x a day  Fluid  x   Diet  x    Vital Signs: Doppler BP: 88 Automatic BP: 85/60 (70) HR: 88 SPO2: 96%  Weight: 169 lbs Home weights: 160s Last weight:  160 lbs  VAD interrogation & Equipment Management (reviewed with Dr Aundra Dubin): Speed:  9200 Flow:  4.9 Power: 5.6w PI: 7.1  Alarms:  none Events:  0 - 2 every few days  Fixed speed: 9200 Low speed limit: 8600  Primary Controller:  Replace back up battery in  16months. Back up controller:   Replace back up battery in  22 months.  Annual Equipment Maintenance on UBC/PM needed next visit.   I reviewed the LVAD parameters from today and compared the results to the patient's prior recorded data.  LVAD interrogation was NEGATIVE for significant power changes, NEGATIVE for clinical alarms and STABLE for PI events/speed drops. No programming changes were made and pump is functioning within specified parameters. Pt did have pump off/driveline disconnect on 7/27 for controller change out/upgrade in clinic last week.   LVAD equipment check completed and is in good working order. Back-up equipment present. LVAD education done on emergency procedures and precautions and reviewed exit site care.   VAD coordinator reviewed daily log from home for daily temperature, weight, and VAD parameters. Pt is performing daily controller and system monitor self tests along with completing weekly and monthly maintenance for LVAD equipment.    Exit Site Care: Drive line is being maintained weekly by VAD  Coordinators. Pt/caregiver deny any alarms or VAD equipment issues.  Drive line exit site well healed and incorporated. The velour is fully implanted at exit site. Dressing dry and intact. No erythema or drainage. Stabilization device present and accurately applied. Driveline dressing is being changed weekly per sterile technique using Sorbaview dressing with biopatch on exit site. Pt denies fever or chills.   Sternal Wound Care: Wound is being maintained by pt and plastic Dr. Marla Roe. Pt has small amount of drainage on the bandage, bandage was last changed on Thursday. Pt is scheduled to see Dr. Marla Roe today at 230pm. Dressing was removed, there was small amount of yellow-brownish drainage on the dressing. A moderate amount of serous drainage was expressed from the site. Wound tunnels approx 1 cm. Wound packed with idoform and dressed by Darrick Grinder NP.       Encounter Details: On PO keflex for h/o MSSA will complete in 2 days.     MAPs 88 - 92 today with doppler reflecting systolic.   Labs: INR 2.19 today. Anticoagulation management includes INR goal of 2 - 2.5 with no ASA. See anticoagulation flow sheet.   Hgb 9.7 today - stable since D/C on current anticoagulation. LDH slightly higher at 330 with established baseline of 250 - 300. Denies tea-colored urine.   Creatinine 1.96  Prealbumin pending.   Medication Changes at this encounter:  1. Stop Reglan. 2. Stop keflex after 2 more days of therapy.  3. Take 40 mEq of Potassium today and on the days that you take Lasix.   RTC in 1 week  for dressing change and BMET, in one month for full visit with annual maintenance.    Tanda Rockers, RN VAD Coordinator   Office: [phone] 24/7 VAD Pager: [phone]  Patient ID: [name], male   DOB: [birth year], 45 y.o.   MRN: [id number]

## 2015-11-18 NOTE — Patient Instructions (Addendum)
1. Stop Reglan.  2. Please stop antibiotics in 2 days, this will complete a 14 day course. I will call you if this changes.  3. Please take 40 mEq of Potassium today and on the days that you take Lasix.  4. Return to clinic in 1 week for labs and dressing change. 5. Return to clinic for full visit in 1 month. Please bring battery charger and power module to this visit.

## 2015-11-19 ENCOUNTER — Telehealth (HOSPITAL_COMMUNITY): Payer: Self-pay | Admitting: Infectious Diseases

## 2015-11-19 MED ORDER — DOXYCYCLINE HYCLATE 100 MG PO CAPS: 100.0000 mg | ORAL_CAPSULE | Freq: Two times a day (BID) | ORAL | 3 refills | Status: DC

## 2015-11-19 NOTE — Telephone Encounter (Addendum)
Patient called with the below information and Rx sent to pharmacy. Caution provided with sun exposure while on this medication and with warfarin interaction. Patient performs POC testing at home and will be monitoring this weekly or as directed by our clinic.    ----- Message from Conrad , NP sent at 11/19/2015  2:07 PM EDT ----- From Dr Linus Salmons  He had MSSA but may be resistant now.  Yes, I would try doxycycline 100 mg bid.  If that doesn't work soon I would try linezolid 600 mg po bid though linezolid interacts with celexa so would need to stop that if possible.  Try the doxy first though.   Rob Comer   Please stop keflex and start doxycycline 100 mg twice a day.   Thank AMy

## 2015-11-20 ENCOUNTER — Encounter (HOSPITAL_COMMUNITY): Payer: Self-pay | Admitting: Unknown Physician Specialty

## 2015-11-25 ENCOUNTER — Other Ambulatory Visit (HOSPITAL_COMMUNITY): Payer: 59

## 2015-11-27 ENCOUNTER — Other Ambulatory Visit (HOSPITAL_COMMUNITY): Payer: Self-pay | Admitting: *Deleted

## 2015-11-27 ENCOUNTER — Ambulatory Visit (HOSPITAL_COMMUNITY): Payer: Self-pay | Admitting: *Deleted

## 2015-11-27 ENCOUNTER — Ambulatory Visit (HOSPITAL_COMMUNITY)
Admission: RE | Admit: 2015-11-27 | Discharge: 2015-11-27 | Disposition: A | Discharge status: Home / Self Care | Payer: 59 | Source: Ambulatory Visit | Attending: Internal Medicine | Admitting: Internal Medicine

## 2015-11-27 DIAGNOSIS — Z95811 Presence of heart assist device: Secondary | ICD-10-CM | POA: Diagnosis not present

## 2015-11-27 DIAGNOSIS — Z7901 Long term (current) use of anticoagulants: Secondary | ICD-10-CM

## 2015-11-27 DIAGNOSIS — I5023 Acute on chronic systolic (congestive) heart failure: Secondary | ICD-10-CM | POA: Diagnosis not present

## 2015-11-27 LAB — PROTIME-INR
INR: 3.06
PROTHROMBIN TIME: 32.3 s — AB (ref 11.4–15.2)

## 2015-11-27 NOTE — Progress Notes (Signed)
  [name] Manske in today to see nurse for sternal and drive line exit dressing change. Pt denies any alarms or VAD equipment issues.   VAD dressing and anchor removed and site care performed using sterile technique. Drive line exit site cleaned with Chlora prep applicators x 2, allowed to dry, skin protectant applied and allowed to dry before Sorbaview dressing and biopatch re-applied. Exit site well healed and incorporated.The velour is fully implanted at exit site. No redness, tenderness, drainage, or foul odor noted. Drive line anchor re-applied. Pt denies fever or chills.   Drive line is being maintained weekly by VAD Coordinators. Pt denies any alarms or VAD equipment issues.  VAD back-up equipment present.   Sternal Wound Care: Sternal dressing intact. Patient is performing dressing changes himself every two days at home.   Dressing removed. Moderate amount tan/yellow drainage noted on bandage, last changed yesterday. Pt reports decreasing amount of drainage being noted with dressing changes.   Dressing removed, site cleaned with Chora prep. Wound tunnels approx 1 cm with immediate welling of tan drainage; no tenderness, redness, or foul odor noted.  Gauze dressing replaced; will continue every other day dressing changes. Pt will call if any increased or change in appearance, or foul odor of drainage, fever, or chills.

## 2015-12-04 ENCOUNTER — Ambulatory Visit (HOSPITAL_COMMUNITY): Payer: Self-pay | Admitting: *Deleted

## 2015-12-04 ENCOUNTER — Telehealth (HOSPITAL_COMMUNITY): Payer: Self-pay | Admitting: *Deleted

## 2015-12-04 ENCOUNTER — Ambulatory Visit (HOSPITAL_COMMUNITY)
Admission: RE | Admit: 2015-12-04 | Discharge: 2015-12-04 | Disposition: A | Discharge status: Home / Self Care | Payer: 59 | Source: Ambulatory Visit | Attending: Cardiology | Admitting: Cardiology

## 2015-12-04 DIAGNOSIS — Z95811 Presence of heart assist device: Secondary | ICD-10-CM | POA: Diagnosis not present

## 2015-12-04 DIAGNOSIS — I5023 Acute on chronic systolic (congestive) heart failure: Secondary | ICD-10-CM

## 2015-12-04 DIAGNOSIS — E876 Hypokalemia: Secondary | ICD-10-CM

## 2015-12-04 DIAGNOSIS — Z7901 Long term (current) use of anticoagulants: Secondary | ICD-10-CM | POA: Insufficient documentation

## 2015-12-04 DIAGNOSIS — T502X5A Adverse effect of carbonic-anhydrase inhibitors, benzothiadiazides and other diuretics, initial encounter: Secondary | ICD-10-CM

## 2015-12-04 LAB — BASIC METABOLIC PANEL
ANION GAP: 6 (ref 5–15)
BUN: 19 mg/dL (ref 6–20)
CO2: 25 mmol/L (ref 22–32)
Calcium: 8.3 mg/dL — ABNORMAL LOW (ref 8.9–10.3)
Chloride: 109 mmol/L (ref 101–111)
Creatinine, Ser: 1.57 mg/dL — ABNORMAL HIGH (ref 0.61–1.24)
GFR calc Af Amer: 59 mL/min — ABNORMAL LOW (ref >60)
GFR, EST NON AFRICAN AMERICAN: 51 mL/min — AB (ref >60)
Glucose, Bld: 89 mg/dL (ref 65–99)
POTASSIUM: 2.9 mmol/L — AB (ref 3.5–5.1)
SODIUM: 140 mmol/L (ref 135–145)

## 2015-12-04 LAB — PROTIME-INR
INR: 3.06
Prothrombin Time: 32.3 seconds — ABNORMAL HIGH (ref 11.4–15.2)

## 2015-12-04 LAB — PREALBUMIN: PREALBUMIN: 13.1 mg/dL — AB (ref 18–38)

## 2015-12-04 MED ORDER — POTASSIUM CHLORIDE CRYS ER 20 MEQ PO TBCR: 80.0000 meq | EXTENDED_RELEASE_TABLET | ORAL | 6 refills | Status: DC

## 2015-12-04 NOTE — Telephone Encounter (Signed)
Called pt per Dr. Haroldine Laws re: K+ 2.9 today. Instructed pt to take K-Dur 80 meq today and increase K-Dur to 40 meq twice daily on the days he takes Lasix . Pt verbalized understanding of above.

## 2015-12-04 NOTE — Progress Notes (Signed)
  [name] in today to see nurse for sternal and drive line exit dressing change. Pt denies any alarms or VAD equipment issues.   VAD dressing and anchor removed and site care performed using sterile technique. Drive line exit site cleaned with Chlora prep applicators x 2, allowed to dry, skin protectant applied and allowed to dry before Sorbaview dressing and biopatch re-applied. Exit site well healed and incorporated.The velour is fully implanted at exit site. No redness, tenderness, drainage, or foul odor noted. Drive line anchor re-applied. Pt denies fever or chills.   Drive line is being maintained weekly by VAD Coordinators. Pt denies any alarms or VAD equipment issues.  VAD back-up equipment present.   Sternal Wound Care: Sternal dressing intact. Patient is performing dressing changes himself daily at home.   Dressing removed. Moderate amount tan/yellow drainage noted on bandage, last changed yesterday. Pt reports decreasing amount of drainage being noted with dressing changes.   Dressing removed, site cleaned with Chora prep. Wound tunnels approx 1 cm with less welling of tan drainage noted; no tenderness, redness, or foul odor noted.  Gauze dressing replaced; will continue every daily dressing changes. Pt will call if any increased or change in appearance, or foul odor of drainage, fever, or chills.   Pt has initial appointment at [facility] with Transplant Cardiologist for transplant evaluation referral. Records have been sent.

## 2015-12-11 ENCOUNTER — Ambulatory Visit (HOSPITAL_COMMUNITY)
Admission: RE | Admit: 2015-12-11 | Discharge: 2015-12-11 | Disposition: A | Discharge status: Home / Self Care | Payer: 59 | Source: Ambulatory Visit | Attending: Internal Medicine | Admitting: Internal Medicine

## 2015-12-11 ENCOUNTER — Other Ambulatory Visit (HOSPITAL_COMMUNITY): Payer: Self-pay | Admitting: *Deleted

## 2015-12-11 ENCOUNTER — Ambulatory Visit (HOSPITAL_COMMUNITY): Payer: Self-pay | Admitting: *Deleted

## 2015-12-11 ENCOUNTER — Encounter (HOSPITAL_COMMUNITY): Payer: Self-pay | Admitting: Infectious Diseases

## 2015-12-11 DIAGNOSIS — Z7901 Long term (current) use of anticoagulants: Secondary | ICD-10-CM

## 2015-12-11 DIAGNOSIS — Z5181 Encounter for therapeutic drug level monitoring: Secondary | ICD-10-CM | POA: Diagnosis present

## 2015-12-11 DIAGNOSIS — Z95811 Presence of heart assist device: Secondary | ICD-10-CM | POA: Insufficient documentation

## 2015-12-11 LAB — PROTIME-INR
INR: 3.85
Prothrombin Time: 38.8 seconds — ABNORMAL HIGH (ref 11.4–15.2)

## 2015-12-11 NOTE — Addendum Note (Signed)
Encounter addended by: Lezlie Octave, RN on: 12/11/2015  3:51 PM<BR>    Actions taken: Sign clinical note

## 2015-12-11 NOTE — Progress Notes (Signed)
[name] in today to see nurse for sternal and drive line exit dressing change. Pt denies any alarms or VAD equipment issues.   VAD dressing and anchor removed and site care performed using sterile technique. Drive line exit site cleaned with Chlora prep applicators x 2, allowed to dry, skin protectant applied and allowed to dry before Sorbaview dressing and biopatch re-applied. Exit site well healed and incorporated.The velour is fully implanted at exit site. No redness, tenderness, drainage, or foul odor noted. Drive line anchor re-applied. Pt denies fever or chills.   Drive line is being maintained weekly by VAD Coordinators. Pt denies any alarms or VAD equipment issues.  VAD back-up equipment present.   Sternal Wound Care: Sternal dressing intact. Patient is performing every other day dressing changes himself daily at home.   Dressing removed. Small amount yellow drainage noted on bandage, last changed day before yesterday. Pt reports decreasing amount of drainage and thinner consistency of drainage.      Dressing removed, site cleaned with Chora prep. Wound tunnels approx 1 cm with no welling of drainage noted; no tenderness, redness, or foul odor noted.  Gauze dressing replaced; will continue every other day dressing changes. Pt will call if any increased or change in appearance, or foul odor of drainage, fever, or chills.   Dr. Prescott Gum updated - will continue current therapy including Doxy 100 mg bid.

## 2015-12-11 NOTE — Addendum Note (Signed)
Encounter addended by: Lezlie Octave, RN on: 12/11/2015  3:20 PM<BR>    Actions taken: Pend clinical note

## 2015-12-13 ENCOUNTER — Other Ambulatory Visit (HOSPITAL_COMMUNITY): Payer: Self-pay | Admitting: Infectious Diseases

## 2015-12-13 ENCOUNTER — Encounter (HOSPITAL_COMMUNITY): Payer: Self-pay | Admitting: Infectious Diseases

## 2015-12-13 ENCOUNTER — Telehealth (HOSPITAL_COMMUNITY): Payer: Self-pay | Admitting: Infectious Diseases

## 2015-12-13 ENCOUNTER — Other Ambulatory Visit: Payer: Self-pay | Admitting: Physician Assistant

## 2015-12-13 DIAGNOSIS — Z01818 Encounter for other preprocedural examination: Secondary | ICD-10-CM

## 2015-12-13 DIAGNOSIS — Z7901 Long term (current) use of anticoagulants: Secondary | ICD-10-CM

## 2015-12-13 DIAGNOSIS — Z95811 Presence of heart assist device: Secondary | ICD-10-CM

## 2015-12-13 DIAGNOSIS — I5022 Chronic systolic (congestive) heart failure: Secondary | ICD-10-CM

## 2015-12-13 NOTE — Progress Notes (Addendum)
Contacted by Tilda Franco at [facility] requesting to schedule the following for [name] to evaluate for heart transplant:  RHC - 93451  CTA Head & Neck - 70496, 70498  Abdominal US - 76700  Carotid Duplex - 93880  ABI Upper / Lower Extrem [id number]   ICD-10: [id number]; I50.22   Will arrange for the above here at [facility] and d/w Dr. Haroldine Laws. CTA of Head/Neck W/ and W/O contrast is OK to perform with recent creatinine 1.5   [name], RN VAD Coordinator   Office: [phone] 24/7 Emergency VAD Pager: [phone]

## 2015-12-13 NOTE — Telephone Encounter (Signed)
[place] scheduled for Wednesday 12/18/2015. PA request in progress. Called patient to inform him to report to hospital at [place]. Take all meds as scheduled (only takes lasix T/Th). Informed him we need to do labwork Monday prior to procedure per Cath lab request. Will call to see if we can get CTA done for head and neck that day as well. PA in progress for procedure as well.   Verbalized understanding of the above

## 2015-12-16 ENCOUNTER — Ambulatory Visit (HOSPITAL_COMMUNITY)
Admission: RE | Admit: 2015-12-16 | Discharge: 2015-12-16 | Disposition: A | Discharge status: Home / Self Care | Payer: 59 | Source: Ambulatory Visit | Attending: Cardiology | Admitting: Cardiology

## 2015-12-16 ENCOUNTER — Ambulatory Visit (HOSPITAL_COMMUNITY): Admission: RE | Admit: 2015-12-16 | Payer: 59 | Source: Ambulatory Visit

## 2015-12-16 ENCOUNTER — Other Ambulatory Visit (HOSPITAL_COMMUNITY): Payer: Self-pay | Admitting: Unknown Physician Specialty

## 2015-12-16 ENCOUNTER — Telehealth (HOSPITAL_COMMUNITY): Payer: Self-pay | Admitting: Unknown Physician Specialty

## 2015-12-16 ENCOUNTER — Ambulatory Visit (HOSPITAL_BASED_OUTPATIENT_CLINIC_OR_DEPARTMENT_OTHER)
Admission: RE | Admit: 2015-12-16 | Discharge: 2015-12-16 | Disposition: A | Discharge status: Home / Self Care | Payer: 59 | Source: Ambulatory Visit | Attending: Internal Medicine | Admitting: Internal Medicine

## 2015-12-16 DIAGNOSIS — Z95811 Presence of heart assist device: Secondary | ICD-10-CM

## 2015-12-16 DIAGNOSIS — Z7901 Long term (current) use of anticoagulants: Secondary | ICD-10-CM

## 2015-12-16 DIAGNOSIS — I5022 Chronic systolic (congestive) heart failure: Secondary | ICD-10-CM | POA: Diagnosis not present

## 2015-12-16 LAB — COMPREHENSIVE METABOLIC PANEL
ALBUMIN: 3.2 g/dL — AB (ref 3.5–5.0)
ALT: 14 U/L — ABNORMAL LOW (ref 17–63)
ANION GAP: 6 (ref 5–15)
AST: 25 U/L (ref 15–41)
Alkaline Phosphatase: 91 U/L (ref 38–126)
BILIRUBIN TOTAL: 0.7 mg/dL (ref 0.3–1.2)
BUN: 29 mg/dL — ABNORMAL HIGH (ref 6–20)
CALCIUM: 8.6 mg/dL — AB (ref 8.9–10.3)
CO2: 22 mmol/L (ref 22–32)
Chloride: 111 mmol/L (ref 101–111)
Creatinine, Ser: 1.61 mg/dL — ABNORMAL HIGH (ref 0.61–1.24)
GFR calc non Af Amer: 49 mL/min — ABNORMAL LOW (ref >60)
GFR, EST AFRICAN AMERICAN: 57 mL/min — AB (ref >60)
GLUCOSE: 84 mg/dL (ref 65–99)
POTASSIUM: 3.1 mmol/L — AB (ref 3.5–5.1)
SODIUM: 139 mmol/L (ref 135–145)
TOTAL PROTEIN: 7.7 g/dL (ref 6.5–8.1)

## 2015-12-16 LAB — PROTIME-INR
INR: 3.14
Prothrombin Time: 33 seconds — ABNORMAL HIGH (ref 11.4–15.2)

## 2015-12-16 LAB — CBC
HEMATOCRIT: 25.3 % — AB (ref 39.0–52.0)
HEMOGLOBIN: 7.8 g/dL — AB (ref 13.0–17.0)
MCH: 22.3 pg — ABNORMAL LOW (ref 26.0–34.0)
MCHC: 30.8 g/dL (ref 30.0–36.0)
MCV: 72.3 fL — ABNORMAL LOW (ref 78.0–100.0)
Platelets: 121 10*3/uL — ABNORMAL LOW (ref 150–400)
RBC: 3.5 MIL/uL — ABNORMAL LOW (ref 4.22–5.81)
RDW: 17.6 % — ABNORMAL HIGH (ref 11.5–15.5)
WBC: 7.5 10*3/uL (ref 4.0–10.5)

## 2015-12-16 LAB — LACTATE DEHYDROGENASE: LDH: 189 U/L (ref 98–192)

## 2015-12-16 NOTE — Telephone Encounter (Signed)
VAD coordinator called pt to give instructions for upcoming cath on Thursday. Pts INR was 3.14 today. Per Dr. Haroldine Laws pt was instructed to hold Coumadin tonight and Tuesday night. Pts K+ was 3.1 pt was instructed to take 69mEq of Potassium today per Dr. Haroldine Laws. Pts hgb today was low at 7.8. Pt denies any blood in his stool but has had several nosebleeds in the last few days. Per Dr. Haroldine Laws he will recheck his hgb by istat on Wednesday during his RHC.

## 2015-12-16 NOTE — Progress Notes (Signed)
Pre-op Cardiac Surgery  Carotid Findings:   Due to continuous arterial flow from LVAD, can not document carotid criteria of stenosis. However, No significant areas of plaque formations were noted to suggest a stenosis.    Upper Extremity Right Left  Brachial Pressures 106 109    Lower  Extremity Right Left  Dorsalis Pedis 118 119  Posterior Tibial 137 128  Ankle/Brachial Indices 1.26 1.17    Findings:   Can not document ABI criteria or waveform evaluation because of LVAD. However, pressures suggest probable normal study at rest.

## 2015-12-17 ENCOUNTER — Telehealth (HOSPITAL_COMMUNITY): Payer: Self-pay | Admitting: *Deleted

## 2015-12-17 NOTE — Telephone Encounter (Signed)
[employer] requires physician to physician review with Dr.[name] for CTA head and CTA neck. Message sent to [place] with phone number and case information. Review must be completed by 5pm on 12/18/2015.    Phone number [phone] *option 3 CTA head case [id number] CTA neck case [id number]  These procedures were requested by Ashley Jacobs for pre surgery evaluation to rule out contraindications for heart transplant.

## 2015-12-18 ENCOUNTER — Ambulatory Visit (HOSPITAL_COMMUNITY): Payer: Self-pay | Admitting: *Deleted

## 2015-12-18 ENCOUNTER — Telehealth (HOSPITAL_COMMUNITY): Payer: Self-pay | Admitting: Infectious Diseases

## 2015-12-18 ENCOUNTER — Encounter (HOSPITAL_COMMUNITY): Payer: Self-pay | Admitting: Internal Medicine

## 2015-12-18 ENCOUNTER — Encounter (HOSPITAL_COMMUNITY)
Admission: RE | Disposition: A | Discharge status: Home / Self Care | Payer: Self-pay | Source: Ambulatory Visit | Attending: Internal Medicine

## 2015-12-18 ENCOUNTER — Ambulatory Visit (HOSPITAL_COMMUNITY)
Admission: RE | Admit: 2015-12-18 | Discharge: 2015-12-18 | Disposition: A | Discharge status: Home / Self Care | Payer: 59 | Source: Ambulatory Visit | Attending: Internal Medicine | Admitting: Internal Medicine

## 2015-12-18 DIAGNOSIS — I480 Paroxysmal atrial fibrillation: Secondary | ICD-10-CM | POA: Insufficient documentation

## 2015-12-18 DIAGNOSIS — I429 Cardiomyopathy, unspecified: Secondary | ICD-10-CM | POA: Diagnosis not present

## 2015-12-18 DIAGNOSIS — Z7901 Long term (current) use of anticoagulants: Secondary | ICD-10-CM | POA: Insufficient documentation

## 2015-12-18 DIAGNOSIS — I081 Rheumatic disorders of both mitral and tricuspid valves: Secondary | ICD-10-CM | POA: Diagnosis not present

## 2015-12-18 DIAGNOSIS — N179 Acute kidney failure, unspecified: Secondary | ICD-10-CM | POA: Insufficient documentation

## 2015-12-18 DIAGNOSIS — I5022 Chronic systolic (congestive) heart failure: Secondary | ICD-10-CM | POA: Diagnosis not present

## 2015-12-18 DIAGNOSIS — E0590 Thyrotoxicosis, unspecified without thyrotoxic crisis or storm: Secondary | ICD-10-CM | POA: Insufficient documentation

## 2015-12-18 DIAGNOSIS — I110 Hypertensive heart disease with heart failure: Secondary | ICD-10-CM | POA: Diagnosis not present

## 2015-12-18 DIAGNOSIS — Z95811 Presence of heart assist device: Secondary | ICD-10-CM

## 2015-12-18 DIAGNOSIS — D649 Anemia, unspecified: Secondary | ICD-10-CM | POA: Insufficient documentation

## 2015-12-18 DIAGNOSIS — M069 Rheumatoid arthritis, unspecified: Secondary | ICD-10-CM | POA: Diagnosis not present

## 2015-12-18 DIAGNOSIS — I482 Chronic atrial fibrillation: Secondary | ICD-10-CM | POA: Diagnosis not present

## 2015-12-18 DIAGNOSIS — Z95810 Presence of automatic (implantable) cardiac defibrillator: Secondary | ICD-10-CM | POA: Diagnosis not present

## 2015-12-18 DIAGNOSIS — E876 Hypokalemia: Secondary | ICD-10-CM | POA: Diagnosis not present

## 2015-12-18 DIAGNOSIS — E46 Unspecified protein-calorie malnutrition: Secondary | ICD-10-CM | POA: Insufficient documentation

## 2015-12-18 HISTORY — PX: CARDIAC CATHETERIZATION: SHX172

## 2015-12-18 LAB — POCT I-STAT 3, VENOUS BLOOD GAS (G3P V)
ACID-BASE DEFICIT: 3 mmol/L — AB (ref 0.0–2.0)
ACID-BASE DEFICIT: 4 mmol/L — AB (ref 0.0–2.0)
Bicarbonate: 21.8 mmol/L (ref 20.0–28.0)
Bicarbonate: 22.6 mmol/L (ref 20.0–28.0)
O2 SAT: 47 %
O2 Saturation: 51 %
PH VEN: 7.321 (ref 7.250–7.430)
PH VEN: 7.33 (ref 7.250–7.430)
TCO2: 23 mmol/L (ref 0–100)
TCO2: 24 mmol/L (ref 0–100)
pCO2, Ven: 41.3 mmHg — ABNORMAL LOW (ref 44.0–60.0)
pCO2, Ven: 43.7 mmHg — ABNORMAL LOW (ref 44.0–60.0)
pO2, Ven: 28 mmHg — CL (ref 32.0–45.0)
pO2, Ven: 29 mmHg — CL (ref 32.0–45.0)

## 2015-12-18 LAB — BASIC METABOLIC PANEL
ANION GAP: 9 (ref 5–15)
BUN: 27 mg/dL — ABNORMAL HIGH (ref 6–20)
CHLORIDE: 107 mmol/L (ref 101–111)
CO2: 22 mmol/L (ref 22–32)
Calcium: 8.7 mg/dL — ABNORMAL LOW (ref 8.9–10.3)
Creatinine, Ser: 2.05 mg/dL — ABNORMAL HIGH (ref 0.61–1.24)
GFR, EST AFRICAN AMERICAN: 43 mL/min — AB (ref >60)
GFR, EST NON AFRICAN AMERICAN: 37 mL/min — AB (ref >60)
Glucose, Bld: 100 mg/dL — ABNORMAL HIGH (ref 65–99)
POTASSIUM: 2.8 mmol/L — AB (ref 3.5–5.1)
SODIUM: 138 mmol/L (ref 135–145)

## 2015-12-18 LAB — PROTIME-INR
INR: 2.52
PROTHROMBIN TIME: 27.6 s — AB (ref 11.4–15.2)

## 2015-12-18 LAB — CBC
HCT: 24.9 % — ABNORMAL LOW (ref 39.0–52.0)
HEMOGLOBIN: 7.7 g/dL — AB (ref 13.0–17.0)
MCH: 22.3 pg — ABNORMAL LOW (ref 26.0–34.0)
MCHC: 30.9 g/dL (ref 30.0–36.0)
MCV: 72 fL — ABNORMAL LOW (ref 78.0–100.0)
PLATELETS: 140 10*3/uL — AB (ref 150–400)
RBC: 3.46 MIL/uL — AB (ref 4.22–5.81)
RDW: 17.6 % — ABNORMAL HIGH (ref 11.5–15.5)
WBC: 7.6 10*3/uL (ref 4.0–10.5)

## 2015-12-18 LAB — IRON AND TIBC
IRON: 69 ug/dL (ref 45–182)
Saturation Ratios: 33 % (ref 17.9–39.5)
TIBC: 210 ug/dL — ABNORMAL LOW (ref 250–450)
UIBC: 141 ug/dL

## 2015-12-18 LAB — PREPARE RBC (CROSSMATCH)

## 2015-12-18 SURGERY — RIGHT HEART CATH

## 2015-12-18 MED ORDER — SODIUM CHLORIDE 0.9 % IV SOLN: 250.0000 mL | INTRAVENOUS | Status: DC | PRN

## 2015-12-18 MED ORDER — SODIUM CHLORIDE 0.9% FLUSH: 3.0000 mL | INTRAVENOUS | Status: DC | PRN

## 2015-12-18 MED ORDER — SODIUM CHLORIDE 0.9% FLUSH: 3.0000 mL | Freq: Two times a day (BID) | INTRAVENOUS | Status: DC

## 2015-12-18 MED ORDER — SODIUM CHLORIDE 0.9 % IV SOLN
INTRAVENOUS | Status: DC
  Administered 2015-12-18: 08:00:00 via INTRAVENOUS

## 2015-12-18 MED ORDER — LIDOCAINE HCL (PF) 1 % IJ SOLN
INTRAMUSCULAR | Status: DC | PRN
  Administered 2015-12-18: 2 mL

## 2015-12-18 MED ORDER — HEPARIN (PORCINE) IN NACL 2-0.9 UNIT/ML-% IJ SOLN
INTRAMUSCULAR | Status: DC | PRN
  Administered 2015-12-18: 500 mL

## 2015-12-18 MED ORDER — MIDAZOLAM HCL 2 MG/2ML IJ SOLN
INTRAMUSCULAR | Status: AC
  Filled 2015-12-18: qty 2

## 2015-12-18 MED ORDER — POTASSIUM CHLORIDE CRYS ER 20 MEQ PO TBCR
40.0000 meq | EXTENDED_RELEASE_TABLET | Freq: Once | ORAL | Status: AC
  Administered 2015-12-18: 40 meq via ORAL

## 2015-12-18 MED ORDER — ONDANSETRON HCL 4 MG/2ML IJ SOLN: 4.0000 mg | Freq: Four times a day (QID) | INTRAMUSCULAR | Status: DC | PRN

## 2015-12-18 MED ORDER — MIDAZOLAM HCL 2 MG/2ML IJ SOLN
INTRAMUSCULAR | Status: DC | PRN
  Administered 2015-12-18: 1 mg

## 2015-12-18 MED ORDER — ACETAMINOPHEN 325 MG PO TABS: 650.0000 mg | ORAL_TABLET | ORAL | Status: DC | PRN

## 2015-12-18 MED ORDER — HEPARIN (PORCINE) IN NACL 2-0.9 UNIT/ML-% IJ SOLN
INTRAMUSCULAR | Status: AC
  Filled 2015-12-18: qty 500

## 2015-12-18 MED ORDER — SODIUM CHLORIDE 0.9 % IV SOLN: Freq: Once | INTRAVENOUS | Status: DC

## 2015-12-18 MED ORDER — LIDOCAINE HCL (PF) 1 % IJ SOLN
INTRAMUSCULAR | Status: AC
  Filled 2015-12-18: qty 30

## 2015-12-18 MED ORDER — POTASSIUM CHLORIDE CRYS ER 20 MEQ PO TBCR
EXTENDED_RELEASE_TABLET | ORAL | Status: AC
  Filled 2015-12-18: qty 2

## 2015-12-18 MED ORDER — FENTANYL CITRATE (PF) 100 MCG/2ML IJ SOLN
INTRAMUSCULAR | Status: DC | PRN
  Administered 2015-12-18: 25 ug via INTRAVENOUS

## 2015-12-18 MED ORDER — FENTANYL CITRATE (PF) 100 MCG/2ML IJ SOLN
INTRAMUSCULAR | Status: AC
  Filled 2015-12-18: qty 2

## 2015-12-18 SURGICAL SUPPLY — 7 items
CATH BALLN WEDGE 5F 110CM (CATHETERS) ×2 IMPLANT
GUIDEWIRE .025 260CM (WIRE) ×2 IMPLANT
PACK CARDIAC CATHETERIZATION (CUSTOM PROCEDURE TRAY) ×2 IMPLANT
SHEATH FAST CATH BRACH 5F 5CM (SHEATH) ×2 IMPLANT
TRANSDUCER W/STOPCOCK (MISCELLANEOUS) ×2 IMPLANT
TUBING ART PRESS 72  MALE/FEM (TUBING) ×1
TUBING ART PRESS 72 MALE/FEM (TUBING) ×1 IMPLANT

## 2015-12-18 NOTE — Interval H&P Note (Signed)
History and Physical Interval Note:  12/18/2015 9:04 AM  [name]  has presented today for surgery, with the diagnosis of hf  The various methods of treatment have been discussed with the patient and family. After consideration of risks, benefits and other options for treatment, the patient has consented to  Procedure(s): Right Heart Cath (N/A) as a surgical intervention .  The patient's history has been reviewed, patient examined, no change in status, stable for surgery.  I have reviewed the patient's chart and labs.  Questions were answered to the patient's satisfaction.     Nikki Glanzer, Quillian Quince

## 2015-12-18 NOTE — H&P (Signed)
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  Admitted from [facility] in [place] in 5/17 with LVAD driveline abscess. Hospitalization was complicated by septic shock, MSSA bacteremia, CDiff, and AKI. Prior to admit blood cultures from [facility] showed MSSA and C diff. Cardiac Surgery and ID consulted on admit.  He was taken to the operating room on May 9th for I and D of epigastric wound. Exposed LVAD hardware was noted. He returned to the OR May 11th for I and D and wound VAC placement. He also had a TEE that did not show any vegetation on valves or ICD wires. VAC later removed due to inability to seal. At that point he had sterile dressing changes. Plastic Surgery was consulted for complex wound with exposed hardware. On May 22nd he returned to the OR with ABRA wound closure device and application of ACell. Over the next several days ABRA device was tightened. On May 30th he returned to the OR for closure of abdominal wound. Over the course of his hospitalization he required multiple blood products due to symptomatic anemia and blood loss from abdominal wound.   ID followed closely through out his hospitalization. He was placed on broad spectrum antibiotics with adjustments made based on blood cultures. He was being treated for MSSA bacteremia, C diff, and klebseilla PNA. He has now completed Ancef, oral Vanc, and Rifampin. Urine with >100K yeast while in hospital and completed course of fluconazole.   Due to septic shock he developed AKI with after having MAPs down to the 40s. Initially requiring pressors and eventually weaned off. Nephrology was consulted and started CVVHD and later transitioned to intermittent HD. Gradually he started making urine and on the day of  discharge he was urinating over 2 liters a day.   Due to severe deconditioning PT consulted with recommendations for CIR. On 09/25/15, he was transferred to CIR in stable condition. He was continued on coumadin with INR goal 2-2.5.   Recently seen at [facility] for transplant eval and felt to be possible candidate for OHTx. Advised need for repeat RHC and other testing. Here today for RHC. Overall feeling good. Wants to go back to work. Denies SOB/PND/Orthopnea. Able to walk the dog 4 times a day. Weight at home 155-156. Having nose bleeds.   Denies LVAD alarms.  Denies driveline trauma, erythema or drainage.  Denies ICD shocks.   Reports taking Coumadin as prescribed and adherence to anticoagulation based dietary restrictions.  Denies bright red blood per rectum or melena, no dark urine or hematuria.    Hgb 7.7 K 2.8 creatinine 2.05  Labs (5/17): K 5.5, creatinine 6.49 => 4.52, hgb 9, LDH 230, INR 2.38 Albs 10/24/2015: Creatinine 2.58        Past Medical History:  Diagnosis Date  . AICD (automatic cardioverter/defibrillator) present   . AKI (acute kidney injury) ([place])   . Atrial fibrillation -parosysmal     Rx w amiodarone  . Automatic implantable cardiac defibrillator -BSX    single chamber  . CHF (congestive heart failure) ([place])   . Chronic systolic heart failure (HCC)    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%)  . Elevated LFTs   . GI bleed -massive    11 Units 2012  . Gout   .  H/O hyperthyroidism   . Hypertension   . Noncompliance    H/O  MEDICAL NONCOMPLIANCE  . 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 Polymorphic ventricular tachycardia (HCC)    RECURRENT WITH APPROPRIATE SHOCK THERAPY IN THE PAST  . RA (rheumatoid arthritis) ([place])   . Severe mitral regurgitation   . Tricuspid valve regurgitation    SEVERE  . Ventricular fibrillation ([place])    WITH APPROPRIATE SHOCK THERAPY IN THE PAST            Current Outpatient Prescriptions  Medication Sig Dispense Refill  . allopurinol (ZYLOPRIM) 100 MG tablet Take 1 tablet (100 mg total) by mouth daily. 30 tablet 6  . cephALEXin (KEFLEX) 500 MG capsule Take 1 capsule (500 mg total) by mouth 3 (three) times daily. 90 capsule 1  . citalopram (CELEXA) 20 MG tablet Take 1 tablet (20 mg total) by mouth daily. 30 tablet 6  . furosemide (LASIX) 80 MG tablet Take 1 tablet (80 mg total) by mouth daily. (Patient taking differently: Take 80 mg by mouth 2 (two) times a week. ) 30 tablet 1  . oxyCODONE (OXY IR/ROXICODONE) 5 MG immediate release tablet Take 1 tablet (5 mg total) by mouth every 3 (three) hours as needed for moderate pain or breakthrough pain. 90 tablet 0  . sildenafil (REVATIO) 20 MG tablet Take 1 tablet (20 mg total) by mouth 3 (three) times daily. 90 tablet 6  . tamsulosin (FLOMAX) 0.4 MG CAPS capsule Take 1 capsule (0.4 mg total) by mouth daily. 30 capsule 3  . warfarin (COUMADIN) 4 MG tablet Take 1 tablet (4 mg total) by mouth one time only at 6 PM. 30 tablet 11  . zolpidem (AMBIEN) 10 MG tablet Take 10 mg by mouth at bedtime as needed for sleep.    . pregabalin (LYRICA) 75 MG capsule Take 1 capsule (75 mg total) by mouth daily. (Patient taking differently: Take 75 mg by mouth 2 (two) times daily. ) 60 capsule 11  . simethicone (MYLICON) 80 MG chewable tablet Chew 1 tablet (80 mg total) by mouth 4 (four) times daily. (Patient not taking: Reported on 11/18/2015) 30 tablet 0   No current facility-administered medications for this encounter.     Phytonadione  REVIEW OF SYSTEMS: All systems negative except as listed in HPI, PMH and Problem list.   VAD interrogation & Equipment Management:  LVAD Flow 4.9 Speed 9200 Power 5.6 PI 7.1 Rare PI  Vitals:   12/18/15 0631  BP: 99/65  Pulse: (!) 117  Resp: 20  Temp: 98.5 F (36.9 C)  TempSrc: Oral  SpO2: 97%  Weight: 74.4 kg (164 lb)  Height: 5\' 7"  (1.702 m)    Physical  Exam: GENERAL: Well appearing, male  HEENT: normal  NECK: Supple, JVP flat No lymphadenopathy or thyromegaly appreciated.   CARDIAC:  Mechanical heart sounds with LVAD hum present.  LUNGS:  Clear to auscultation bilaterally.  ABDOMEN:  Soft, round, nontender, positive bowel sounds x4.   Dressing intact   LVAD exit site:  Site pk.  EXTREMITIES:  Warm and dry, no cyanosis, clubbing, rash or edema  NEUROLOGIC:  Alert and oriented x 4.  Gait steady.  No aphasia.  No dysarthria.  Affect pleasant.      ASSESSMENT AND PLAN:   1. Chronic systolic HF: Status post HMII LVAD implantation 12/20/14.  Now status post long hospitalization as complication of driveline infection.  NYHA I-II. Volume status stable. Cut  back on lasix with rising creatinine. Plan RHC today. Continue Trans[facility] w/u at [facility] 2. RV failure:  Now off milrinone.  Continue sildenafil.  Off digoxin with renal failure.  3. HTN: MAP stable off antihypertensives.  4. Atrial fibrillation: Paroxysmal.  Regular pulse.   On coumadin for VAD. He is no longer on amiodarone.  5. Anticoagulation management: INR goal 2.0-2.5, no aspirin.    6. AKI: Creatinine still up slightly. Cut lasix as tolerted. Use prn only.  7. Driveline infection: S Improved, Followed by ID. On suppressive abx 8. Anemia: Hemoglobin down.  Will give 2 units today in short stay. Check iron srtores.  9. Malnutrition: Appetite much improved. Check Prealbumin.  10. Hypokalemia- Take 40 meq potassium today then increase daily use.   F/U in VAD Clinic.   [name], Daniel,MD 9:01 AM

## 2015-12-18 NOTE — Discharge Instructions (Signed)
STOP LASIX, INCREASE IRON TO 3 X DAILY. NOTHING TO EAT AFTER MIDNIGHT Thursday NIGHT, TAKE AM MEDS WITH SIP OF WATER  Venogram, Care After Refer to this sheet in the next few weeks. These instructions provide you with information on caring for yourself after your procedure. Your health care provider may also give you more specific instructions. Your treatment has been planned according to current medical practices, but problems sometimes occur. Call your health care provider if you have any problems or questions after your procedure. WHAT TO EXPECT AFTER THE PROCEDURE After your procedure, it is typical to have the following sensations:  Mild discomfort at the catheter insertion site. HOME CARE INSTRUCTIONS   Take all medicines exactly as directed.  Follow any prescribed diet.  Follow instructions regarding both rest and physical activity.  Drink more fluids for the first several days after the procedure in order to help flush dye from your kidneys. SEEK MEDICAL CARE IF:  You develop a rash.  You have fever not controlled by medicine. SEEK IMMEDIATE MEDICAL CARE IF:  There is pain, drainage, bleeding, redness, swelling, warmth or a red streak at the site of the IV tube.  The extremity where your IV tube was placed becomes discolored, numb, or cool.  You have difficulty breathing or shortness of breath.  You develop chest pain.  You have excessive dizziness or fainting.   This information is not intended to replace advice given to you by your health care provider. Make sure you discuss any questions you have with your health care provider.   Document Released: 01/25/2013 Document Revised: 04/11/2013 Document Reviewed: 01/25/2013 Elsevier Interactive Patient Education 2016 [place].  Blood Transfusion, Care After These instructions give you information about caring for yourself after your procedure. Your doctor may also give you more specific instructions. Call your  doctor if you have any problems or questions after your procedure.  HOME CARE  Take medicines only as told by your doctor. Ask your doctor if you can take an over-the-counter pain reliever if you have a fever or headache a day or two after your procedure.  Return to your normal activities as told by your doctor. GET HELP IF:   You develop redness or irritation at your IV site.  You have a fever, chills, or a headache that does not go away.  Your pee (urine) is darker than normal.  Your urine turns:  Pink.  Red.  [place].  The white part of your eye turns yellow (jaundice).  You feel weak after doing your normal activities. GET HELP RIGHT AWAY IF:   You have trouble breathing.  You have fever and chills and you also have:  Anxiety.  Chest or back pain.  Flushed or pink skin.  Clammy or sweaty skin.  A fast heartbeat.  A sick feeling in your stomach (nausea).   This information is not intended to replace advice given to you by your health care provider. Make sure you discuss any questions you have with your health care provider.   Document Released: 04/27/2014 Document Reviewed: 04/27/2014 Elsevier Interactive Patient Education [employer].

## 2015-12-18 NOTE — Telephone Encounter (Signed)
Scheduled for CTA of head / neck Friday 12/20/2015 @ 1500. Advised patient only liquids 4h prior to study (will be NPO for cardioversion at 1300) and to check in at Radiology 47m prior to appointment.   PA for Procedures: --[id number] - [id number] --[id number]  No PA required for Abdominal US: Patient unable to make scheduled appt. Made on 12/25/15 @ 0830 d/t returning to work. Nothing available this week. Will CB to schedule once he talks with his employer.   [name], RN VAD Coordinator   Office: [phone] 24/7 Emergency VAD Pager: [phone]

## 2015-12-18 NOTE — Progress Notes (Signed)
VAD Coordinator Procedure Note:   Patient underwent right heart cath per Dr. Haroldine Laws.  VAD coordinator brought VAD cart to Cath Lab 5. Hemodynamics and VAD parameters monitored by myself throughout the procedure. MAPs were obtained with automatic BP cuff on left arm.        HR: Auto cuff(MAP): Flow: PI: Power:     Speed:                Pre-procedure: 8:55   115 114/90 (97)  4.4 7.4 5.1       9200    Sedation Induction: 9:00   110 113/89 (96)  4.2 7.4 5.1  9:15   114 110/87 (94)  4.0 6.6 5.3 9:25   116 123/89 (100)  4.1 6.8 5.0 9:35   112 114/90 (97)  4.0 6.9 5.0   Recovery area: 9:55   121 100/58 (72)  3.8 6.5 4.8   Patient tolerated the procedure well.   Hgb 7.7 - Dr. Haroldine Laws spoke with patient prior to [facility] about need for blood transfusion today, pt agreed to same. Verbal permission given with two nurses attesting to same (pt draped for sterile procedure).  Iron profile drawn during cath procedure per Dr. Haroldine Laws.   Pt found to be in atrial flutter today. Pt states he felt his heart went "out of rhythm a day or two ago". Documented therapeutic INR since 10/18/15, will schedule cardioversion for Friday of this week per Dr. Haroldine Laws.   Patient Disposition: home

## 2015-12-19 ENCOUNTER — Telehealth (HOSPITAL_COMMUNITY): Payer: Self-pay

## 2015-12-19 ENCOUNTER — Encounter (HOSPITAL_COMMUNITY): Payer: 59

## 2015-12-19 NOTE — Telephone Encounter (Signed)
CT called to get clarification on patient's CT angio head and neck tomorrow. Patient's Cr 2.05.  Will need to reschedule this as patient requires angio with contrast for cardiac transplant workup. LVAD coordinators and Dr. Haroldine Laws made aware.  [name], RN

## 2015-12-20 ENCOUNTER — Ambulatory Visit (HOSPITAL_COMMUNITY)
Admission: RE | Admit: 2015-12-20 | Discharge: 2015-12-20 | Disposition: A | Discharge status: Home / Self Care | Payer: 59 | Source: Ambulatory Visit | Attending: Internal Medicine | Admitting: Internal Medicine

## 2015-12-20 ENCOUNTER — Encounter (HOSPITAL_COMMUNITY): Payer: Self-pay | Admitting: Infectious Diseases

## 2015-12-20 ENCOUNTER — Encounter (HOSPITAL_COMMUNITY): Payer: Self-pay | Admitting: Certified Registered Nurse Anesthetist

## 2015-12-20 ENCOUNTER — Other Ambulatory Visit (HOSPITAL_COMMUNITY): Payer: 59

## 2015-12-20 ENCOUNTER — Encounter (HOSPITAL_COMMUNITY)
Admission: RE | Disposition: A | Discharge status: Home / Self Care | Payer: Self-pay | Source: Ambulatory Visit | Attending: Internal Medicine

## 2015-12-20 ENCOUNTER — Encounter (HOSPITAL_COMMUNITY): Payer: Self-pay | Admitting: *Deleted

## 2015-12-20 ENCOUNTER — Ambulatory Visit (HOSPITAL_COMMUNITY): Admission: RE | Admit: 2015-12-20 | Payer: 59 | Source: Ambulatory Visit

## 2015-12-20 DIAGNOSIS — I110 Hypertensive heart disease with heart failure: Secondary | ICD-10-CM | POA: Insufficient documentation

## 2015-12-20 DIAGNOSIS — Z95811 Presence of heart assist device: Secondary | ICD-10-CM | POA: Insufficient documentation

## 2015-12-20 DIAGNOSIS — Z7901 Long term (current) use of anticoagulants: Secondary | ICD-10-CM | POA: Insufficient documentation

## 2015-12-20 DIAGNOSIS — Z95810 Presence of automatic (implantable) cardiac defibrillator: Secondary | ICD-10-CM | POA: Insufficient documentation

## 2015-12-20 DIAGNOSIS — I5022 Chronic systolic (congestive) heart failure: Secondary | ICD-10-CM | POA: Diagnosis not present

## 2015-12-20 DIAGNOSIS — I480 Paroxysmal atrial fibrillation: Secondary | ICD-10-CM

## 2015-12-20 DIAGNOSIS — M109 Gout, unspecified: Secondary | ICD-10-CM | POA: Insufficient documentation

## 2015-12-20 DIAGNOSIS — I472 Ventricular tachycardia, unspecified: Secondary | ICD-10-CM

## 2015-12-20 DIAGNOSIS — Z5309 Procedure and treatment not carried out because of other contraindication: Secondary | ICD-10-CM | POA: Diagnosis not present

## 2015-12-20 DIAGNOSIS — I428 Other cardiomyopathies: Secondary | ICD-10-CM | POA: Insufficient documentation

## 2015-12-20 DIAGNOSIS — Z79899 Other long term (current) drug therapy: Secondary | ICD-10-CM | POA: Insufficient documentation

## 2015-12-20 DIAGNOSIS — I482 Chronic atrial fibrillation: Secondary | ICD-10-CM | POA: Diagnosis not present

## 2015-12-20 DIAGNOSIS — Z792 Long term (current) use of antibiotics: Secondary | ICD-10-CM | POA: Insufficient documentation

## 2015-12-20 DIAGNOSIS — Z8674 Personal history of sudden cardiac arrest: Secondary | ICD-10-CM | POA: Insufficient documentation

## 2015-12-20 SURGERY — CANCELLED PROCEDURE

## 2015-12-20 NOTE — Progress Notes (Signed)
Patient arrived to endoscopy for cardioversion today, sinus rhythm on monitor, 12 lead EKG obtained reveals same. Dr. Mahalia Longest called to report this, he reviewed 12 lead. Verbal order received to cancel cardioversion for patient self converted. Patient expresses understanding and agrees.

## 2015-12-20 NOTE — Progress Notes (Signed)
[name] in today to see nurse for sternal and drive line exit dressing change. Pt denies any alarms or VAD equipment issues.   VAD dressing and anchor removed and site care performed using sterile technique. Drive line exit site cleaned with Chlora prep applicators x 2, allowed to dry, skin protectant applied and allowed to dry before Sorbaview dressing and biopatch re-applied. Exit site well healed and incorporated.The velour is fully implanted at exit site. No redness, tenderness, drainage, or foul odor noted. Drive line anchor re-applied. Pt denies fever or chills.   Drive line is being maintained weekly by VAD Coordinators. Pt denies any alarms or VAD equipment issues.  VAD back-up equipment present.   Sternal Wound Care: Sternal dressing intact. Patient is performing every other day dressing changes himself daily at home.     Dressing removed. No drainage on the dressing. Pt states dressing was changed day before yesterday. Pt reports no drainage for over a week. Site cleaned with Chora prep. Wound  no tenderness, redness, or foul odor noted.  Gauze dressing replaced. Pt will call if wound reopens or change in appearance, or foul odor of drainage, fever, or chills.   Dr. Prescott Gum updated - will continue current therapy including Doxy 100 mg bid. Per Dr. Prescott Gum pt may shower and can leave abdominal OTA.   Annual Maintenance performed on home equipment by [employer] today: Pt given new cable for [id number] (passed inspection). Charmian Muff is PN [id number] L/N [id number]; Lot # [id number].  New internal battery installed-[id number] Exp. 06-18-18. [facility] -[id number] passed all inspection.

## 2015-12-22 LAB — TYPE AND SCREEN
ABO/RH(D): O POS
ANTIBODY SCREEN: NEGATIVE
UNIT DIVISION: 0
UNIT DIVISION: 0

## 2015-12-25 ENCOUNTER — Ambulatory Visit (HOSPITAL_COMMUNITY): Payer: 59

## 2015-12-25 ENCOUNTER — Other Ambulatory Visit (HOSPITAL_COMMUNITY): Payer: Self-pay | Admitting: Unknown Physician Specialty

## 2015-12-25 DIAGNOSIS — Z95811 Presence of heart assist device: Secondary | ICD-10-CM

## 2015-12-25 DIAGNOSIS — Z7901 Long term (current) use of anticoagulants: Secondary | ICD-10-CM

## 2015-12-26 ENCOUNTER — Other Ambulatory Visit (HOSPITAL_COMMUNITY): Payer: Self-pay | Admitting: *Deleted

## 2015-12-26 ENCOUNTER — Ambulatory Visit (HOSPITAL_COMMUNITY): Admission: RE | Admit: 2015-12-26 | Payer: 59 | Source: Ambulatory Visit

## 2015-12-26 ENCOUNTER — Ambulatory Visit (HOSPITAL_COMMUNITY): Payer: Self-pay | Admitting: Unknown Physician Specialty

## 2015-12-26 ENCOUNTER — Ambulatory Visit (HOSPITAL_COMMUNITY)
Admission: RE | Admit: 2015-12-26 | Discharge: 2015-12-26 | Disposition: A | Discharge status: Home / Self Care | Payer: 59 | Source: Ambulatory Visit | Attending: Internal Medicine | Admitting: Internal Medicine

## 2015-12-26 ENCOUNTER — Other Ambulatory Visit (HOSPITAL_COMMUNITY): Payer: Self-pay | Admitting: Unknown Physician Specialty

## 2015-12-26 DIAGNOSIS — I5023 Acute on chronic systolic (congestive) heart failure: Secondary | ICD-10-CM | POA: Diagnosis not present

## 2015-12-26 DIAGNOSIS — Z95811 Presence of heart assist device: Secondary | ICD-10-CM | POA: Diagnosis not present

## 2015-12-26 DIAGNOSIS — Z7901 Long term (current) use of anticoagulants: Secondary | ICD-10-CM | POA: Insufficient documentation

## 2015-12-26 LAB — PROTIME-INR
INR: 2.5
PROTHROMBIN TIME: 27.5 s — AB (ref 11.4–15.2)

## 2015-12-26 LAB — BASIC METABOLIC PANEL
ANION GAP: 6 (ref 5–15)
BUN: 15 mg/dL (ref 6–20)
CALCIUM: 8.8 mg/dL — AB (ref 8.9–10.3)
CHLORIDE: 110 mmol/L (ref 101–111)
CO2: 26 mmol/L (ref 22–32)
CREATININE: 1.52 mg/dL — AB (ref 0.61–1.24)
GFR calc non Af Amer: 53 mL/min — ABNORMAL LOW (ref >60)
Glucose, Bld: 101 mg/dL — ABNORMAL HIGH (ref 65–99)
Potassium: 3.9 mmol/L (ref 3.5–5.1)
SODIUM: 142 mmol/L (ref 135–145)

## 2015-12-26 NOTE — Progress Notes (Signed)
Pt presented to VAD clinic for DL dressing change.   LVAD Exit Site:  VAD dressing and anchor removed and site care performed using sterile technique. Drive line exit site cleaned with Chlora prep applicators x 2, allowed to dry, skin protectant applied and allowed to dry before Sorbaview dressing and biopatch re-applied. Exit site well healed and incorporated.The velour is fully implanted at exit site. No redness, tenderness, drainage, or foul odor noted. Drive line anchor re-applied. Pt denies fever or chills. Driveline dressing is being changed weekly by VAD Coordinators per sterile technique.   Sternal Site: No dressing present; site closed. Scar tissue noted with some erythema, slight tenderness to touch, no drainage or redness noted. Advised pt to monitor for increased tenderness or signs of redness, warmth, drainage, or fever/chills and call VAD clinic/pager if any of these symptoms should present. Pt verbalized understanding of same.   Zada Girt, RN VAD Coordinator  Office: [phone] 24/7 Emergency VAD Pager: [phone]

## 2016-01-02 ENCOUNTER — Ambulatory Visit (HOSPITAL_COMMUNITY)
Admission: RE | Admit: 2016-01-02 | Discharge: 2016-01-02 | Disposition: A | Discharge status: Home / Self Care | Payer: 59 | Source: Ambulatory Visit | Attending: Internal Medicine | Admitting: Internal Medicine

## 2016-01-02 ENCOUNTER — Encounter (HOSPITAL_COMMUNITY): Payer: Self-pay

## 2016-01-02 ENCOUNTER — Ambulatory Visit (HOSPITAL_COMMUNITY): Payer: Self-pay | Admitting: Infectious Diseases

## 2016-01-02 DIAGNOSIS — I5022 Chronic systolic (congestive) heart failure: Secondary | ICD-10-CM

## 2016-01-02 DIAGNOSIS — Z95811 Presence of heart assist device: Secondary | ICD-10-CM

## 2016-01-02 DIAGNOSIS — Z01818 Encounter for other preprocedural examination: Secondary | ICD-10-CM | POA: Diagnosis present

## 2016-01-02 DIAGNOSIS — I6522 Occlusion and stenosis of left carotid artery: Secondary | ICD-10-CM | POA: Insufficient documentation

## 2016-01-02 MED ORDER — IOPAMIDOL (ISOVUE-370) INJECTION 76%
INTRAVENOUS | Status: AC
  Administered 2016-01-02: 50 mL
  Filled 2016-01-02: qty 50

## 2016-01-02 NOTE — Progress Notes (Signed)
Pt presented to VAD clinic for DL dressing change.   LVAD Exit Site:  VAD dressing and anchor removed and site care performed using sterile technique. Drive line exit site cleaned with Chlora prep applicators x 2, allowed to dry, skin protectant applied and allowed to dry before Sorbaview dressing and biopatch re-applied. Exit site well healed and incorporated.The velour is fully implanted at exit site. No redness, tenderness, drainage, or foul odor noted. Drive line anchor re-applied. Pt denies fever or chills. Driveline dressing is being changed weekly by VAD Coordinators per sterile technique.   Sternal Site: No dressing present; site closed. Scar tissue noted with some erythema, slight tenderness to touch, no drainage or redness noted. Dried yellow exudate present. Cleansed with Sterile Saline and left OTA. Advised pt to monitor for increased tenderness or signs of redness, warmth, drainage, or fever/chills and call VAD clinic/pager if any of these symptoms should present. Pt verbalized understanding of same.   Tanda Rockers, RN VAD Coordinator  Office: [phone] 24/7 Emergency VAD Pager: [phone]

## 2016-01-13 ENCOUNTER — Ambulatory Visit (HOSPITAL_COMMUNITY): Payer: Self-pay | Admitting: *Deleted

## 2016-01-13 ENCOUNTER — Other Ambulatory Visit (HOSPITAL_COMMUNITY): Payer: Self-pay | Admitting: *Deleted

## 2016-01-13 ENCOUNTER — Ambulatory Visit (HOSPITAL_COMMUNITY)
Admission: RE | Admit: 2016-01-13 | Discharge: 2016-01-13 | Disposition: A | Discharge status: Home / Self Care | Payer: 59 | Source: Ambulatory Visit | Attending: Cardiology | Admitting: Cardiology

## 2016-01-13 DIAGNOSIS — Z7901 Long term (current) use of anticoagulants: Secondary | ICD-10-CM

## 2016-01-13 DIAGNOSIS — I5023 Acute on chronic systolic (congestive) heart failure: Secondary | ICD-10-CM | POA: Diagnosis not present

## 2016-01-13 DIAGNOSIS — Z95811 Presence of heart assist device: Secondary | ICD-10-CM

## 2016-01-13 DIAGNOSIS — I5022 Chronic systolic (congestive) heart failure: Secondary | ICD-10-CM

## 2016-01-13 LAB — PROTIME-INR
INR: 2.11
Prothrombin Time: 24 seconds — ABNORMAL HIGH (ref 11.4–15.2)

## 2016-01-13 NOTE — Progress Notes (Signed)
Pt presented to VAD clinic for DL dressing change and INR check.  LVAD Exit Site:  VAD dressing and anchor removed and site care performed using sterile technique. Drive line exit site cleaned with Chlora prep applicators x 2, allowed to dry, skin protectant applied and allowed to dry before Sorbaview dressing and biopatch re-applied. Exit site well healed and incorporated.The velour is fully implanted at exit site. No redness, tenderness, drainage, or foul odor noted. Drive line anchor re-applied. Pt denies fever or chills. Driveline dressing is being changed weekly by VAD Coordinators per sterile technique.   Sternal Site: No dressing present; site closed. Scar tissue noted with some erythema, slight tenderness to touch, no drainage or redness noted.  Advised pt to monitor for increased tenderness or signs of redness, warmth, drainage, or fever/chills and call VAD clinic/pager if any of these symptoms should present. Pt verbalized understanding of same.   Zada Girt, RN VAD Coordinator  Office: [phone] 24/7 Emergency VAD Pager: [phone]

## 2016-01-20 ENCOUNTER — Ambulatory Visit (HOSPITAL_COMMUNITY)
Admission: RE | Admit: 2016-01-20 | Discharge: 2016-01-20 | Disposition: A | Discharge status: Home / Self Care | Payer: 59 | Source: Ambulatory Visit | Attending: Internal Medicine | Admitting: Internal Medicine

## 2016-01-20 DIAGNOSIS — Z029 Encounter for administrative examinations, unspecified: Secondary | ICD-10-CM | POA: Diagnosis not present

## 2016-01-22 ENCOUNTER — Other Ambulatory Visit: Payer: Self-pay | Admitting: Internal Medicine

## 2016-01-23 ENCOUNTER — Other Ambulatory Visit (HOSPITAL_COMMUNITY): Payer: Self-pay | Admitting: *Deleted

## 2016-01-23 NOTE — Telephone Encounter (Signed)
Opened in error

## 2016-01-30 ENCOUNTER — Encounter (HOSPITAL_COMMUNITY): Payer: 59

## 2016-01-30 ENCOUNTER — Encounter (HOSPITAL_COMMUNITY): Payer: Self-pay

## 2016-01-30 NOTE — Progress Notes (Signed)
Disk and reports received from radiology department at [facility] per [name] VAD Coordinator request. Placed in VAD folder in CHF clinic.  [name], RN

## 2016-01-31 ENCOUNTER — Encounter (HOSPITAL_COMMUNITY): Payer: Self-pay

## 2016-01-31 ENCOUNTER — Telehealth (HOSPITAL_COMMUNITY): Payer: Self-pay | Admitting: Infectious Diseases

## 2016-01-31 ENCOUNTER — Ambulatory Visit (HOSPITAL_COMMUNITY)
Admission: RE | Admit: 2016-01-31 | Discharge: 2016-01-31 | Disposition: A | Discharge status: Home / Self Care | Payer: 59 | Source: Ambulatory Visit | Attending: Cardiology | Admitting: Cardiology

## 2016-01-31 ENCOUNTER — Other Ambulatory Visit (HOSPITAL_COMMUNITY): Payer: Self-pay | Admitting: Infectious Diseases

## 2016-01-31 ENCOUNTER — Ambulatory Visit (HOSPITAL_COMMUNITY): Payer: Self-pay | Admitting: Infectious Diseases

## 2016-01-31 VITALS — Ht 67.0 in | Wt 191.6 lb

## 2016-01-31 DIAGNOSIS — M069 Rheumatoid arthritis, unspecified: Secondary | ICD-10-CM | POA: Diagnosis not present

## 2016-01-31 DIAGNOSIS — I10 Essential (primary) hypertension: Secondary | ICD-10-CM

## 2016-01-31 DIAGNOSIS — Z792 Long term (current) use of antibiotics: Secondary | ICD-10-CM

## 2016-01-31 DIAGNOSIS — I482 Chronic atrial fibrillation: Secondary | ICD-10-CM | POA: Diagnosis not present

## 2016-01-31 DIAGNOSIS — M109 Gout, unspecified: Secondary | ICD-10-CM | POA: Diagnosis not present

## 2016-01-31 DIAGNOSIS — Z7901 Long term (current) use of anticoagulants: Secondary | ICD-10-CM | POA: Diagnosis not present

## 2016-01-31 DIAGNOSIS — E0590 Thyrotoxicosis, unspecified without thyrotoxic crisis or storm: Secondary | ICD-10-CM | POA: Diagnosis not present

## 2016-01-31 DIAGNOSIS — I5022 Chronic systolic (congestive) heart failure: Secondary | ICD-10-CM | POA: Insufficient documentation

## 2016-01-31 DIAGNOSIS — D649 Anemia, unspecified: Secondary | ICD-10-CM | POA: Insufficient documentation

## 2016-01-31 DIAGNOSIS — E46 Unspecified protein-calorie malnutrition: Secondary | ICD-10-CM | POA: Insufficient documentation

## 2016-01-31 DIAGNOSIS — I480 Paroxysmal atrial fibrillation: Secondary | ICD-10-CM | POA: Diagnosis not present

## 2016-01-31 DIAGNOSIS — I071 Rheumatic tricuspid insufficiency: Secondary | ICD-10-CM | POA: Diagnosis not present

## 2016-01-31 DIAGNOSIS — I429 Cardiomyopathy, unspecified: Secondary | ICD-10-CM | POA: Insufficient documentation

## 2016-01-31 DIAGNOSIS — I472 Ventricular tachycardia: Secondary | ICD-10-CM | POA: Diagnosis not present

## 2016-01-31 DIAGNOSIS — Z95811 Presence of heart assist device: Secondary | ICD-10-CM

## 2016-01-31 DIAGNOSIS — Z9119 Patient's noncompliance with other medical treatment and regimen: Secondary | ICD-10-CM | POA: Insufficient documentation

## 2016-01-31 DIAGNOSIS — E876 Hypokalemia: Secondary | ICD-10-CM | POA: Insufficient documentation

## 2016-01-31 DIAGNOSIS — Z95810 Presence of automatic (implantable) cardiac defibrillator: Secondary | ICD-10-CM | POA: Insufficient documentation

## 2016-01-31 DIAGNOSIS — N179 Acute kidney failure, unspecified: Secondary | ICD-10-CM | POA: Insufficient documentation

## 2016-01-31 DIAGNOSIS — I110 Hypertensive heart disease with heart failure: Secondary | ICD-10-CM | POA: Insufficient documentation

## 2016-01-31 DIAGNOSIS — Z8674 Personal history of sudden cardiac arrest: Secondary | ICD-10-CM | POA: Diagnosis not present

## 2016-01-31 LAB — BASIC METABOLIC PANEL
Anion gap: 8 (ref 5–15)
BUN: 14 mg/dL (ref 6–20)
CO2: 21 mmol/L — ABNORMAL LOW (ref 22–32)
CREATININE: 1.52 mg/dL — AB (ref 0.61–1.24)
Calcium: 8.7 mg/dL — ABNORMAL LOW (ref 8.9–10.3)
Chloride: 112 mmol/L — ABNORMAL HIGH (ref 101–111)
GFR, EST NON AFRICAN AMERICAN: 53 mL/min — AB (ref >60)
Glucose, Bld: 63 mg/dL — ABNORMAL LOW (ref 65–99)
POTASSIUM: 3.1 mmol/L — AB (ref 3.5–5.1)
SODIUM: 141 mmol/L (ref 135–145)

## 2016-01-31 LAB — PROTIME-INR
INR: 2.6 — AB (ref <=1.1)
INR: 2.6 — AB (ref <=1.1)
INR: 2.61
Prothrombin Time: 28.4 seconds — ABNORMAL HIGH (ref 11.4–15.2)

## 2016-01-31 LAB — CBC
HCT: 32.4 % — ABNORMAL LOW (ref 39.0–52.0)
Hemoglobin: 10.3 g/dL — ABNORMAL LOW (ref 13.0–17.0)
MCH: 23.1 pg — ABNORMAL LOW (ref 26.0–34.0)
MCHC: 31.8 g/dL (ref 30.0–36.0)
MCV: 72.6 fL — ABNORMAL LOW (ref 78.0–100.0)
PLATELETS: 134 10*3/uL — AB (ref 150–400)
RBC: 4.46 MIL/uL (ref 4.22–5.81)
RDW: 17.2 % — AB (ref 11.5–15.5)
WBC: 5.1 10*3/uL (ref 4.0–10.5)

## 2016-01-31 LAB — LACTATE DEHYDROGENASE: LDH: 225 U/L — AB (ref 98–192)

## 2016-01-31 MED ORDER — AMLODIPINE BESYLATE 5 MG PO TABS: 5.0000 mg | ORAL_TABLET | Freq: Every day | ORAL | 0 refills | Status: DC

## 2016-01-31 NOTE — Progress Notes (Signed)
Patient ID: ABOUBACAR MATSUO, male   DOB: 09-05-1970, 45 y.o.   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  Admitted from [facility] in [place] in 5/17 with LVAD driveline abscess. Hospitalization was complicated by septic shock, MSSA bacteremia, CDiff, and AKI. Prior to admit blood cultures from [facility] showed MSSA and C diff. Cardiac Surgery and ID consulted on admit.  He was taken to the operating room on May 9th for I and D of epigastric wound. Exposed LVAD hardware was noted. He returned to the OR May 11th for I and D and wound VAC placement. He also had a TEE that did not show any vegetation on valves or ICD wires. VAC later removed due to inability to seal. At that point he had sterile dressing changes. Plastic Surgery was consulted for complex wound with exposed hardware. On May 22nd he returned to the OR with ABRA wound closure device and application of ACell. Over the next several days ABRA device was tightened. On May 30th he returned to the OR for closure of abdominal wound. Over the course of his hospitalization he required multiple blood products due to symptomatic anemia and blood loss from abdominal wound.   ID followed closely through out his hospitalization. He was placed on broad spectrum antibiotics with adjustments made based on blood cultures. He was being treated for MSSA bacteremia, C diff, and klebseilla PNA. He has now completed Ancef, oral Vanc, and Rifampin. Urine with >100K yeast while in hospital and completed course of fluconazole.   Due to septic shock he developed AKI with after having MAPs down to the 40s. Initially requiring pressors and eventually weaned off. Nephrology was consulted and  started CVVHD and later transitioned to intermittent HD. Gradually he started making urine and on the day of discharge he was urinating over 2 liters a day.   Due to severe deconditioning PT consulted with recommendations for CIR. On 09/25/15, he was transferred to CIR in stable condition. He was continued on coumadin with INR goal 2-2.5.   He returns for LVAD follow up. Overall feeling good. Denies SOB/PND/Orthopnea. Able to walk the dog multiple times a day.  Working full time again. Weight at home 179 pounds. Appetite back to normal. No  fever.  VAD dressing changed weekly in the clinic. No fever or chills. Abdominal wound closed in August.  Taking all medications. He is being evaluated at [facility] for transplant.   Denies LVAD alarms.  Denies driveline trauma, erythema or drainage.  Denies ICD shocks.   Reports taking Coumadin as prescribed and adherence to anticoagulation based dietary restrictions.  Denies bright red blood per rectum or melena, no dark urine or hematuria.    Labs (5/17): K 5.5, creatinine 6.49 => 4.52, hgb 9, LDH 230, INR 2.38 Labs (12/26/2015): K 3.9 Creatinine 1.52   RHC 11/2015 RA = 6 RV = 23/8 PA = 25/4 (19) PCW = 6 Fick cardiac output/index = 4.8/2.6 PVR = 2.4 WU Ao sat = 98% PA sat = 49%, 51% 1. Well compensated hemodynamics with VAD support. 2. PA sat low in the setting of marked anemia (Hgb 7.7 g/dl)     Past Medical History:  Diagnosis Date  . AICD (  automatic cardioverter/defibrillator) present   . AKI (acute kidney injury) ([place])   . Atrial fibrillation -parosysmal     Rx w amiodarone  . Automatic implantable cardiac defibrillator -BSX    single chamber  . CHF (congestive heart failure) ([place])   . Chronic systolic heart failure (HCC)    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%)  . Elevated LFTs   . GI bleed -massive    11 Units 2012  . Gout   . H/O hyperthyroidism   . Hypertension   . Noncompliance    H/O  MEDICAL NONCOMPLIANCE  .  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 Polymorphic ventricular tachycardia (HCC)    RECURRENT WITH APPROPRIATE SHOCK THERAPY IN THE PAST  . RA (rheumatoid arthritis) ([place])   . Severe mitral regurgitation   . Tricuspid valve regurgitation    SEVERE  . Ventricular fibrillation ([place])    WITH APPROPRIATE SHOCK THERAPY IN THE PAST    Current Outpatient Prescriptions  Medication Sig Dispense Refill  . allopurinol (ZYLOPRIM) 100 MG tablet Take 1 tablet (100 mg total) by mouth daily. 30 tablet 6  . citalopram (CELEXA) 20 MG tablet Take 1 tablet (20 mg total) by mouth daily. 30 tablet 6  . doxycycline (VIBRAMYCIN) 100 MG capsule Take 1 capsule (100 mg total) by mouth 2 (two) times daily. Continue until your physician tells you to stop. (Patient taking differently: Take 100 mg by mouth 3 (three) times daily. Continue until your physician tells you to stop.) 60 capsule 3  . ferrous sulfate 324 (65 Fe) MG TBEC Take 1 tablet (325 mg total) by mouth 2 (two) times daily. 30 tablet   . furosemide (LASIX) 80 MG tablet Take 1 tablet (80 mg total) by mouth 2 (two) times a week.    . potassium chloride SA (K-DUR,KLOR-CON) 20 MEQ tablet Take 4 tablets (80 mEq total) by mouth 2 (two) times a week. 60 tablet 6  . pregabalin (LYRICA) 75 MG capsule Take 1 capsule (75 mg total) by mouth 2 (two) times daily.    . sildenafil (REVATIO) 20 MG tablet take 1 tablet by mouth three times a day 90 tablet 11  . tamsulosin (FLOMAX) 0.4 MG CAPS capsule Take 1 capsule (0.4 mg total) by mouth daily. 30 capsule 3  . warfarin (COUMADIN) 4 MG tablet Take 1 tablet (4 mg total) by mouth one time only at 6 PM. 30 tablet 11  . zolpidem (AMBIEN) 10 MG tablet Take 10 mg by mouth at bedtime as needed for sleep.    Marland Kitchen oxyCODONE (OXY IR/ROXICODONE) 5 MG immediate release tablet Take 1 tablet (5 mg total) by mouth every 3 (three) hours as needed for moderate pain or breakthrough pain. (Patient not taking:  Reported on 01/31/2016) 90 tablet 0   No current facility-administered medications for this encounter.     Phytonadione  REVIEW OF SYSTEMS: All systems negative except as listed in HPI, PMH and Problem list.   VAD interrogation & Equipment Management:  LVAD Flow 4.5 Speed 9200 Power 5.2 PI 7.4 Rare PI  Vital Signs:  Ht 5\' 7"  (1.702 m)   Wt 191 lb 9.6 oz (86.9 kg)   BMI 30.01 kg/m  MAP 120   Physical Exam: GENERAL: Well appearing, male Ambulated in the clinic without difficulty  HEENT: normal  NECK: Supple, JVP flat No lymphadenopathy or thyromegaly appreciated.   CARDIAC:  Mechanical heart sounds with LVAD hum present.  LUNGS:  Clear  to auscultation bilaterally.  ABDOMEN:  Soft, round, nontender, positive bowel sounds x4.   Dressing intact   LVAD exit site:  Site is closing but there is still some drainage.  EXTREMITIES:  Warm and dry, no cyanosis, clubbing, rash or edema  NEUROLOGIC:  Alert and oriented x 4.  Gait steady.  No aphasia.  No dysarthria.  Affect pleasant.      ASSESSMENT AND PLAN:   1. Chronic systolic HF: Status post HMII LVAD implantation 12/20/14.  S/P tatus post long hospitalization as complication of driveline infection in June 2017. He is doing great!. Working full time. He is being followed at [facility] for transplant. Had [place] on 12/18/15 with well compensated hemodynamics.  NYHA I. Volume status stable despite weight gain. Appetite has picked up but will need to watch weight gain. . Continue Lasix 80 mg twice a week.40 meq of potassium on days he takes lasix. He was instructed to take an extra 40 mg of lasix for 3-4 weight gain in 24 hours.  2. RV failure:  Now off milrinone.  Continue sildenafil.  Off digoxin with renal failure.  3. HTN: MAP elevated. Add 5 mg amlodipine daily.  Would not use BB with RV failure. Would not use ARB/Spiro for now with CKD 4. Atrial fibrillation: Paroxysmal.  Regular pulse.   On coumadin for VAD. He is no longer on amiodarone.  5.  Anticoagulation management: INR goal 2.0-2.5, no aspirin.    6 AKI: Seems to have Good UOP on Lasix 80 mg daily.  BMET today.    7. Driveline infection: wound completely healed. Will send message to Dr Linus Salmons regarding chronic antibiotics. Likely stop doxycyline. 8. Anemia: CBC today.    9. Malnutrition: Appetite much improved.  10. Hypokalemia- BMET today -   Follow up in 1 week for dressing change and to check MAP.   Follow up in 8 weeks     Orly Quimby NP-C  01/31/2016

## 2016-01-31 NOTE — Patient Instructions (Addendum)
Goal weight 179 lbs. If your weight gets up to 183 lbs take an extra 1/2 pill (40 mg) of lasix with 1/2 dose of potassium.   Please call to make an appointment with Nephrology (kidney doctor) for follow up. We checked your kidney function today.   Infectious Disease will call you to make an appointment regarding the Doxycycline.

## 2016-01-31 NOTE — Progress Notes (Signed)
Symptom Yes No Details  Angina  x Activity:  Claudication  x How far:  Syncope  x When:  Stroke  x   Orthopnea  x How many pillows:  PND  x How often:  CPAP  x How many hrs:  Pedal edema  x   Abd fullness  x   N&V  x   Diaphoresis  x When:  Bleeding  x   Urine  x   SOB  x Activity:  Palpitations  x When:  ICD shock  x   Hospitlizaitons  x When/where/why:  ED visit  x When/where/why:  Other MD  x When/who/why:  Activity  x Walking dog 4x a day  Fluid  x   Diet  x    Vital Signs: Doppler BP: 120 Automatic BP: 145/109 (132) HR: 80 SPO2: 100%  Weight: 191.6 lbs Home weights: 179 lbs Last weight: 169 lbs (Weight in July - has been eating better again  VAD interrogation & Equipment Management (reviewed with Dr Aundra Dubin): Speed:  9200 Flow:  5.2 Power: 5.5w PI: 7.7  Alarms: none Events: 0 - 2 every few days  Fixed speed: 9200 Low speed limit: 8600  Primary Controller:  Replace back up battery in 19 months. Back up controller:   Replace back up battery not present to verify.   Annual Equipment Maintenance: 12/2015  I reviewed the LVAD parameters from today and compared the results to the patient's prior recorded data.  LVAD interrogation was NEGATIVE for significant power changes, NEGATIVE for clinical alarms and STABLE for PI events/speed drops. No programming changes were made and pump is functioning within specified parameters. Pt did have pump off/driveline disconnect on 7/27 for controller change out/upgrade in clinic last week.   LVAD equipment check completed and is in good working order. Back-up equipment present. LVAD education done on emergency procedures and precautions and reviewed exit site care.   VAD coordinator reviewed daily log from home for daily temperature, weight, and VAD parameters. Pt is performing daily controller and system monitor self tests along with completing weekly and monthly maintenance for LVAD equipment.    Exit Site Care: Drive  line is being maintained weekly by VAD Coordinators. Pt/caregiver deny any alarms or VAD equipment issues.  Drive line exit site well healed and incorporated. The velour is fully implanted at exit site. Dressing dry and intact. No erythema or drainage. Stabilization device present and accurately applied. Driveline dressing is being changed weekly per sterile technique using Sorbaview dressing with biopatch on exit site. Pt denies fever or chills.   Sternal Wound Care: Wound has been closed since late August / early September.   Encounter Details: Reports to clinic today for routine follow up. Has been feeling great and 'back to normal'. 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 Denies shortness of breath (at rest or exertion), CP, edema, orthopnea/PND or chest pain. Unlimited with incline/stairs. Chest wound still reportedly close w/o drainage. Currently still maintained on Doxycycline 100 mg BID. Will schedule him an appointment with ID ([place]) regarding duration of therapy since wound is closed per Amy. He will call and make FU appointment with nephrology. Discussed goal weight of 179 lb - Advised per Amy that he will take an extra 1/2 tab of lasix with 40 mEq KCl should he develop swelling or weight gain to 183 lbs.   MAPs 120 -130 today with doppler reflecting systolic. Has been off antihypertensives with AKI (temp dialysis) since discharge  from prolonged hospital stay.    Labs: INR 2.61 today. Anticoagulation management includes INR goal of 2 - 2.5 with no ASA. See anticoagulation flow sheet.   Hgb 10.3 today - much improved over the last month. Stable on current anticoagulation with PO iron. No S/S of bleeding.   LDH slightly higher at 225 with established baseline of 250 - 300. Denies tea-colored urine.   Creatinine 1.52  Medication Changes at this encounter: Start amlodipine 5 mg QD.    RTC in 1 week  for dressing change and blood pressure check. 8  weeks for VAD clinic. Provided him with disc of CT scans to take to appointment with [facility] next Wednesday. Will fax clinic note as well.   [name], RN VAD Coordinator   Office: [phone] 24/7 Emergency VAD Pager: [phone]

## 2016-01-31 NOTE — Telephone Encounter (Signed)
Called re: labs - potassium low at 3.1 but he has not taken his 20mEq today yet. D/W Amy and will have him just take his dose today as scheduled since labs OK last draw.

## 2016-02-06 ENCOUNTER — Telehealth: Payer: Self-pay | Admitting: Infectious Diseases

## 2016-02-06 ENCOUNTER — Ambulatory Visit (HOSPITAL_COMMUNITY)
Admission: RE | Admit: 2016-02-06 | Discharge: 2016-02-06 | Disposition: A | Discharge status: Home / Self Care | Payer: 59 | Source: Ambulatory Visit | Attending: Cardiology | Admitting: Cardiology

## 2016-02-06 DIAGNOSIS — I10 Essential (primary) hypertension: Secondary | ICD-10-CM | POA: Diagnosis not present

## 2016-02-06 MED ORDER — AMLODIPINE BESYLATE 10 MG PO TABS: 10.0000 mg | ORAL_TABLET | Freq: Every day | ORAL | 6 refills | Status: DC

## 2016-02-06 NOTE — Patient Instructions (Addendum)
Infectious Disease Doctor Megan Salon on February 24, 2016 Address: [address], [place], [place] [address]  Phone: [phone]   For your abdominal ultrasound - nothing to eat or drink 4 hours prior to your test. Report to radiology 15 min prior to your appointment. Park at the [facility] and vascular garage (where you park for [place] clinic) and we will show you where radiology is. I will call you when we schedule this today.

## 2016-02-06 NOTE — Progress Notes (Addendum)
Pt presented to VAD clinic for DL dressing change.  LVAD Exit Site:  VAD dressing and anchor removed and site care performed using sterile technique. Drive line exit site cleaned with Chlora prep applicators x 2, allowed to dry, skin protectant applied and allowed to dry before Sorbaview dressing and biopatch re-applied. Exit site well healed and incorporated.The velour is fully implanted at exit site. No redness, tenderness, drainage, or foul odor noted. Drive line anchor re-applied. Pt denies fever or chills. Driveline dressing is being changed weekly by VAD Coordinators per sterile technique.  INTERMACs Registry:  Patient completed 1420 feet during 6 minute walk test. Tolerated very well with no stopping.   Neurocognitive trail making completed correctly in 1 m 0 s.   [place] Cardiomyopathy, Post-VAD QOL were provided to him to complete and bring back next week   BP still elevated  Doppler: 122 Auto: 120/98 (MAP 113)  D/W Amy Clegg and will increase Amlodipine to 10 mg QD. Consider Spiro 12.5 mg next week if still elevated.    [name], RN VAD Coordinator   Office: [phone] 24/7 Emergency VAD Pager: [phone]

## 2016-02-06 NOTE — Telephone Encounter (Signed)
Called to inform patient of appt for Comp Abdominal US for transplant evaluation Wednesday 02/12/16 @ 08:30. Report here at [facility] radiology department at 08:15. NPO x 6 hours prior to study. Also scheduled for dressing change, INR  and BP check with [place] next Wednesday after study ~9:15. Verbalized understanding of the above.

## 2016-02-12 ENCOUNTER — Ambulatory Visit (HOSPITAL_COMMUNITY)
Admission: RE | Admit: 2016-02-12 | Discharge: 2016-02-12 | Disposition: A | Discharge status: Home / Self Care | Payer: 59 | Source: Ambulatory Visit | Attending: Cardiology | Admitting: Cardiology

## 2016-02-12 ENCOUNTER — Ambulatory Visit (HOSPITAL_COMMUNITY): Payer: 59

## 2016-02-12 ENCOUNTER — Ambulatory Visit (HOSPITAL_COMMUNITY)
Admission: RE | Admit: 2016-02-12 | Discharge: 2016-02-12 | Disposition: A | Discharge status: Home / Self Care | Payer: 59 | Source: Ambulatory Visit | Attending: Internal Medicine | Admitting: Internal Medicine

## 2016-02-12 ENCOUNTER — Ambulatory Visit (HOSPITAL_COMMUNITY): Payer: Self-pay | Admitting: Infectious Diseases

## 2016-02-12 VITALS — Wt 189.4 lb

## 2016-02-12 DIAGNOSIS — Z01818 Encounter for other preprocedural examination: Secondary | ICD-10-CM | POA: Diagnosis present

## 2016-02-12 DIAGNOSIS — I5023 Acute on chronic systolic (congestive) heart failure: Secondary | ICD-10-CM | POA: Diagnosis not present

## 2016-02-12 DIAGNOSIS — I480 Paroxysmal atrial fibrillation: Secondary | ICD-10-CM | POA: Diagnosis not present

## 2016-02-12 DIAGNOSIS — T502X5A Adverse effect of carbonic-anhydrase inhibitors, benzothiadiazides and other diuretics, initial encounter: Secondary | ICD-10-CM

## 2016-02-12 DIAGNOSIS — E876 Hypokalemia: Secondary | ICD-10-CM

## 2016-02-12 DIAGNOSIS — Z95811 Presence of heart assist device: Secondary | ICD-10-CM | POA: Insufficient documentation

## 2016-02-12 DIAGNOSIS — I5022 Chronic systolic (congestive) heart failure: Secondary | ICD-10-CM | POA: Diagnosis present

## 2016-02-12 LAB — PROTIME-INR
INR: 2.47
Prothrombin Time: 27.2 seconds — ABNORMAL HIGH (ref 11.4–15.2)

## 2016-02-12 MED ORDER — FUROSEMIDE 80 MG PO TABS: ORAL_TABLET | ORAL | 3 refills | Status: DC

## 2016-02-12 MED ORDER — POTASSIUM CHLORIDE CRYS ER 20 MEQ PO TBCR: EXTENDED_RELEASE_TABLET | ORAL | 6 refills | Status: DC

## 2016-02-12 NOTE — Addendum Note (Signed)
Encounter addended by: Louann Liv, LCSW on: 02/12/2016 11:44 AM<BR>    Actions taken: Sign clinical note

## 2016-02-12 NOTE — Progress Notes (Signed)
CSW met with patient who shared upcoming appointments for heart transplant evaluation at [facility]. Patient spoke at length about his emotional process and journey through VAD to transplant. Patient spoke of support from VAD team and all his family and friends that have supported him throughout this process. Patient states he has been through so much and yet still has so more to pursue. Patient appears to be positive and motivated for transplant. CSW continues to follow for supportive intervention as needed. Raquel Sarna, LCSW [phone]

## 2016-02-12 NOTE — Progress Notes (Signed)
Sterile weekly driveline change performed, site clean dry and intact, no drainage/bleeding/ordor noted. Patient does present with slightly distended abdomen with scant bilateral lower extremity edema. Amy Clegg NP-C assessed patient, torsemide and potassium were increased. Patient given new instructions with updated med list, Rx updated to preferred pharmacy electronically. Patient escorted to [facility] radiology for abd [place]. [name] VAD Coordinator made aware.  [name], RN

## 2016-02-12 NOTE — Patient Instructions (Addendum)
INCREASE Lasix to 80 mg (1 tab) three times weekly on Tuesday Thursday Saturday  INCREASE Potassium to 80 meq (4 tabs) three times weekly on Tuesday Thursday and Saturday  Return next Thursday at 1:00 pm for INR and dressing change.  Follow up 04/02/2016 as scheduled.

## 2016-02-20 ENCOUNTER — Ambulatory Visit (HOSPITAL_COMMUNITY)
Admission: RE | Admit: 2016-02-20 | Discharge: 2016-02-20 | Disposition: A | Discharge status: Home / Self Care | Payer: 59 | Source: Ambulatory Visit | Attending: Internal Medicine | Admitting: Internal Medicine

## 2016-02-20 DIAGNOSIS — Z4801 Encounter for change or removal of surgical wound dressing: Secondary | ICD-10-CM | POA: Insufficient documentation

## 2016-02-20 DIAGNOSIS — Z95811 Presence of heart assist device: Secondary | ICD-10-CM

## 2016-02-20 NOTE — Progress Notes (Signed)
Pt presented to VAD clinic for DL dressing change.  LVAD Exit Site:  VAD dressing and anchor removed and site care performed using sterile technique. Drive line exit site cleaned with Chlora prep applicators x 2, allowed to dry, skin protectant applied and allowed to dry before Sorbaview dressing and biopatch re-applied. Exit site well healed and incorporated.The velour is fully implanted at exit site. No redness, tenderness, drainage, or foul odor noted. Drive line anchor re-applied. Pt denies fever or chills. Driveline dressing is being changed weekly by VAD Coordinators per sterile technique. Small open area from pressure on drive line observed. Re-oriented dressing/drive line to relieve pressure.   BP controlled but now complaining of swelling to LE's. Needs to take extra diuretics due to swelling but does not feel it helps much   Doppler: 104 Auto: 99/71 (89)  Will D/W Amy Clegg about the above and advised I will call patient back with plan for BP medication.    [name], RN VAD Coordinator   Office: [phone] 24/7 Emergency VAD Pager: [phone]

## 2016-02-24 ENCOUNTER — Ambulatory Visit (INDEPENDENT_AMBULATORY_CARE_PROVIDER_SITE_OTHER): Payer: 59 | Admitting: Internal Medicine

## 2016-02-24 DIAGNOSIS — L089 Local infection of the skin and subcutaneous tissue, unspecified: Secondary | ICD-10-CM | POA: Diagnosis not present

## 2016-02-24 DIAGNOSIS — T148XXA Other injury of unspecified body region, initial encounter: Secondary | ICD-10-CM

## 2016-02-24 NOTE — Assessment & Plan Note (Signed)
I suspect that his wound infection and MSSA bacteremia have been cured. Given that the abscess appeared to be walled off and separate from his LVAD I suggest stopping doxycycline and observing off of antibiotics now.

## 2016-02-24 NOTE — Progress Notes (Signed)
[facility]  Patient Active Problem List   Diagnosis Date Noted  . Wound infection     Priority: High  . Staphylococcus aureus bacteremia     Priority: High  . PAF (paroxysmal atrial fibrillation) ([place]) 11/18/2015  . Debility 09/25/2015  . Debilitated 09/25/2015  . Chronic systolic congestive heart failure ([place])   . Colitis   . Septic shock ([name])   . Enteritis due to Clostridium difficile   . 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[place])   . Abscess of abdominal wall   . Acute on chronic systolic (congestive) heart failure   . Left ventricular assist device (LVAD) complication 09/73/5329  . Epistaxis 05/21/2015  . Situational depression 04/13/2015  . Acute gout 01/15/2015  . Palliative care encounter 12/17/2014  . Cardiogenic shock ([place])   . Acute on chronic systolic CHF (congestive heart failure) ([place])   . Acute on chronic renal failure ([place])   . Chronic atrial fibrillation ([place])   . Low output heart failure ([place]) 12/14/2014  . Acute abdominal pain   . Near syncope   . Hypotension 11/30/2014  . AKI (acute kidney injury) ([place])   . Abnormal LFTs 10/26/2012  . Hyperthyroidism 05/26/2011  . 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 ICD-[employer] 07/22/2010  . Nonischemic cardiomyopathy ([place]) 07/22/2010  . Essential hypertension, benign 11/21/2008  . SYSTOLIC HEART FAILURE, CHRONIC 10/23/2008    Patient's Medications  New Prescriptions   No medications on file  Previous Medications   ALLOPURINOL (ZYLOPRIM) 100 MG TABLET    Take 1 tablet (100 mg total) by mouth daily.   AMLODIPINE (NORVASC) 10 MG TABLET    Take 1 tablet (10 mg total) by mouth daily.   CITALOPRAM (CELEXA) 20 MG TABLET    Take 1 tablet (20 mg total) by mouth daily.   FERROUS SULFATE 324 (65 FE) MG TBEC    Take 1 tablet (325 mg total) by mouth 2 (two) times daily.   FUROSEMIDE (LASIX) 80 MG TABLET    Take 80 mg (1 tab) three times weekly Tues  Thurs Sat   OXYCODONE (OXY IR/ROXICODONE) 5 MG IMMEDIATE RELEASE TABLET    Take 1 tablet (5 mg total) by mouth every 3 (three) hours as needed for moderate pain or breakthrough pain.   POTASSIUM CHLORIDE SA (K-DUR,KLOR-CON) 20 MEQ TABLET    Take 80 meq (4 tabs) three times weekly Tues Thurs Sat   PREGABALIN (LYRICA) 75 MG CAPSULE    Take 1 capsule (75 mg total) by mouth 2 (two) times daily.   SILDENAFIL (REVATIO) 20 MG TABLET    take 1 tablet by mouth three times a day   TAMSULOSIN (FLOMAX) 0.4 MG CAPS CAPSULE    Take 1 capsule (0.4 mg total) by mouth daily.   WARFARIN (COUMADIN) 4 MG TABLET    Take 1 tablet (4 mg total) by mouth one time only at 6 PM.   ZOLPIDEM (AMBIEN) 10 MG TABLET    Take 10 mg by mouth at bedtime as needed for sleep.  Modified Medications   No medications on file  Discontinued Medications   DOXYCYCLINE (VIBRAMYCIN) 100 MG CAPSULE    Take 1 capsule (100 mg total) by mouth 2 (two) times daily. Continue until your physician tells you to stop.    Subjective: [name] is in for his hospital follow-up visit. He has heart failure and an AICD. He underwent LVAD placement last December and did well postoperatively. His  mother passed away and he returned home to [place] in late April. He states that he was not feeling well and was taken to the hospital there. He was found to have MSSA bacteremia. He was started on IV antibiotics. After admission he began to have epigastric swelling and was found to have an abdominal wall abscess overlying the device. He was transferred here and underwent incision and drainage. Dr. Lucianne Lei Trigt's note stated: "The outflow elbow of the LVAD was visible. The abscess cavity measured approximately 6 cm, but was well walled off from the rest of the pump pocket." He was seen by my partners while hospitalized and treated with IV cefazolin and oral rifampin. Initially the plan was to complete 6 weeks of therapy then stop on 10/08/2015. Apparently because of  persistent wound drainage and an open wound he was converted to oral cephalexin. Sometime in the past few months that was changed to doxycycline. He is not sure why and I cannot decipher a reason from notes in [place]. His wound has been completely closed without drainage for 2 months. He has had no fever, chills or sweats and is feeling back to baseline.  Review of Systems: Review of Systems  Constitutional: Negative for chills, diaphoresis, fever, malaise/fatigue and weight loss.  HENT: Negative for sore throat.   Respiratory: Negative for cough and shortness of breath.   Cardiovascular: Negative for chest pain.  Gastrointestinal: Negative for abdominal pain, diarrhea, nausea and vomiting.  Skin: Negative for rash.    Past Medical History:  Diagnosis Date  . AICD (automatic cardioverter/defibrillator) present   . AKI (acute kidney injury) ([place])   . Atrial fibrillation -parosysmal     Rx w amiodarone  . Automatic implantable cardiac defibrillator -BSX    single chamber  . CHF (congestive heart failure) ([place])   . Chronic systolic heart failure (HCC)    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%)  . Elevated LFTs   . GI bleed -massive    11 Units 2012  . Gout   . H/O hyperthyroidism   . Hypertension   . Noncompliance    H/O  MEDICAL NONCOMPLIANCE  . 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 Polymorphic ventricular tachycardia (HCC)    RECURRENT WITH APPROPRIATE SHOCK THERAPY IN THE PAST  . RA (rheumatoid arthritis) ([place])   . Severe mitral regurgitation   . Tricuspid valve regurgitation    SEVERE  . Ventricular fibrillation ([place])    WITH APPROPRIATE SHOCK THERAPY IN THE PAST    Social History  Substance Use Topics  . Smoking status: Never Smoker  . Smokeless tobacco: Never Used  . Alcohol use No    Family History  Problem Relation Age of Onset  . Heart failure Brother     Allergies  Allergen Reactions  . Phytonadione  Other (See Comments)    Patient has LVAD: please check with LVAD coordinator on call or LVAD MD on call before reversal of anticoagulation with vit k    Objective: Vitals:   02/24/16 1529  Temp: 98 F (36.7 C)  TempSrc: Oral  Weight: 197 lb (89.4 kg)  Height: 5\' 7"  (1.702 m)   Body mass index is 30.85 kg/m.  Physical Exam  Constitutional:  He is in good spirits.  Cardiovascular:  LVAD hum.  Pulmonary/Chest: Effort normal and breath sounds normal. He has no wheezes. He has no rales.  Abdominal: Soft. There is no tenderness.  His incisions are fully  healed. In the midportion of the epigastric incision and there is a heaped up area of scar where the open wound was. There is no fluctuance, erythema or tenderness.    Lab Results    Problem List Items Addressed This Visit      High   Wound infection    I suspect that his wound infection and MSSA bacteremia have been cured. Given that the abscess appeared to be walled off and separate from his LVAD I suggest stopping doxycycline and observing off of antibiotics now.          Michel Bickers, MD [facility] [phone] pager   [phone] cell 02/24/2016, 3:58 PM

## 2016-02-26 ENCOUNTER — Telehealth (HOSPITAL_COMMUNITY): Payer: Self-pay | Admitting: Infectious Diseases

## 2016-02-26 DIAGNOSIS — I10 Essential (primary) hypertension: Secondary | ICD-10-CM

## 2016-02-26 MED ORDER — SPIRONOLACTONE 25 MG PO TABS: 12.5000 mg | ORAL_TABLET | Freq: Every day | ORAL | 1 refills | Status: DC

## 2016-02-26 MED ORDER — AMLODIPINE BESYLATE 5 MG PO TABS: 5.0000 mg | ORAL_TABLET | Freq: Every day | ORAL | 3 refills | Status: DC

## 2016-02-26 NOTE — Telephone Encounter (Signed)
Patient informed of changes to BP medications. Will recheck BMET in 1 week after starting Arlyce Harman. Patient verbalized understanding of the plan to decrease Amlodipine back to 5 mg QD and start Spiro 12.5 mg QD.

## 2016-02-27 ENCOUNTER — Ambulatory Visit (HOSPITAL_COMMUNITY)
Admission: RE | Admit: 2016-02-27 | Discharge: 2016-02-27 | Disposition: A | Discharge status: Home / Self Care | Payer: 59 | Source: Ambulatory Visit | Attending: Cardiology | Admitting: Cardiology

## 2016-02-27 ENCOUNTER — Other Ambulatory Visit (HOSPITAL_COMMUNITY): Payer: Self-pay | Admitting: Infectious Diseases

## 2016-02-27 DIAGNOSIS — I5023 Acute on chronic systolic (congestive) heart failure: Secondary | ICD-10-CM

## 2016-02-27 DIAGNOSIS — Z7901 Long term (current) use of anticoagulants: Secondary | ICD-10-CM

## 2016-02-27 DIAGNOSIS — Z95811 Presence of heart assist device: Secondary | ICD-10-CM | POA: Diagnosis not present

## 2016-02-27 NOTE — Progress Notes (Signed)
Weekly sterile dressing change completed. Patient has scabbed oblong area under tegaderm dressing where driveline sat. Area cleaned, driveline repositioned 1/2 inch below scabbed site, sterile tegaderm applied. Made patient aware and to call/page VAD coordinator if he experiences any bleeding, swelling, itching to area. [name], RN

## 2016-03-02 ENCOUNTER — Other Ambulatory Visit: Payer: Self-pay | Admitting: Internal Medicine

## 2016-03-02 DIAGNOSIS — Z95810 Presence of automatic (implantable) cardiac defibrillator: Secondary | ICD-10-CM

## 2016-03-02 DIAGNOSIS — I472 Ventricular tachycardia, unspecified: Secondary | ICD-10-CM

## 2016-03-02 DIAGNOSIS — I5022 Chronic systolic (congestive) heart failure: Secondary | ICD-10-CM

## 2016-03-02 DIAGNOSIS — I428 Other cardiomyopathies: Secondary | ICD-10-CM

## 2016-03-02 DIAGNOSIS — I482 Chronic atrial fibrillation, unspecified: Secondary | ICD-10-CM

## 2016-03-05 ENCOUNTER — Other Ambulatory Visit (HOSPITAL_COMMUNITY): Payer: Self-pay | Admitting: Cardiology

## 2016-03-05 ENCOUNTER — Ambulatory Visit (HOSPITAL_COMMUNITY): Payer: Self-pay | Admitting: Pharmacist

## 2016-03-05 ENCOUNTER — Ambulatory Visit (HOSPITAL_COMMUNITY)
Admission: RE | Admit: 2016-03-05 | Discharge: 2016-03-05 | Disposition: A | Discharge status: Home / Self Care | Payer: 59 | Source: Ambulatory Visit | Attending: Internal Medicine | Admitting: Internal Medicine

## 2016-03-05 DIAGNOSIS — I428 Other cardiomyopathies: Secondary | ICD-10-CM

## 2016-03-05 DIAGNOSIS — I5022 Chronic systolic (congestive) heart failure: Secondary | ICD-10-CM | POA: Diagnosis present

## 2016-03-05 DIAGNOSIS — I472 Ventricular tachycardia, unspecified: Secondary | ICD-10-CM

## 2016-03-05 DIAGNOSIS — I5023 Acute on chronic systolic (congestive) heart failure: Secondary | ICD-10-CM | POA: Diagnosis not present

## 2016-03-05 DIAGNOSIS — Z95810 Presence of automatic (implantable) cardiac defibrillator: Secondary | ICD-10-CM

## 2016-03-05 DIAGNOSIS — Z95811 Presence of heart assist device: Secondary | ICD-10-CM | POA: Diagnosis not present

## 2016-03-05 DIAGNOSIS — Z4801 Encounter for change or removal of surgical wound dressing: Secondary | ICD-10-CM | POA: Diagnosis not present

## 2016-03-05 DIAGNOSIS — I482 Chronic atrial fibrillation, unspecified: Secondary | ICD-10-CM

## 2016-03-05 LAB — BASIC METABOLIC PANEL
Anion gap: 7 (ref 5–15)
BUN: 18 mg/dL (ref 6–20)
CO2: 21 mmol/L — ABNORMAL LOW (ref 22–32)
CREATININE: 1.77 mg/dL — AB (ref 0.61–1.24)
Calcium: 8.9 mg/dL (ref 8.9–10.3)
Chloride: 107 mmol/L (ref 101–111)
GFR calc Af Amer: 51 mL/min — ABNORMAL LOW (ref >60)
GFR, EST NON AFRICAN AMERICAN: 44 mL/min — AB (ref >60)
GLUCOSE: 72 mg/dL (ref 65–99)
POTASSIUM: 4.3 mmol/L (ref 3.5–5.1)
Sodium: 135 mmol/L (ref 135–145)

## 2016-03-05 LAB — PROTIME-INR
INR: 2.5
Prothrombin Time: 27.5 seconds — ABNORMAL HIGH (ref 11.4–15.2)

## 2016-03-05 MED ORDER — PREGABALIN 75 MG PO CAPS: 75.0000 mg | ORAL_CAPSULE | Freq: Two times a day (BID) | ORAL | 2 refills | Status: DC

## 2016-03-05 NOTE — Progress Notes (Signed)
Pt presented to VAD clinic for DL dressing change.  LVAD Exit Site:  VAD dressing and anchor removed and site care performed using sterile technique. Drive line exit site cleaned with Chlora prep applicators x 2, allowed to dry, skin protectant applied and allowed to dry before Sorbaview dressing and biopatch re-applied. Exit site well healed and incorporated.The velour is fully implanted at exit site. No redness, tenderness, drainage, or foul odor noted. Drive line anchor re-applied. Pt denies fever or chills. Driveline dressing is being changed weekly by VAD Coordinators per sterile technique.  Patient was asked to return [employer] Cardiomyopathy, Post-VAD QOL, however patient is unsure if he still has them and unable to provide second copy. Will let Dorian Heckle, RN VAD Coordinator know.  Labs: BMET, INR done  Patient will return x 2 weeks as next week is Thanksgiving and will be out of town, weekly dressing kit given to patient for dressing change due next week.

## 2016-03-19 ENCOUNTER — Ambulatory Visit (HOSPITAL_COMMUNITY): Payer: Self-pay | Admitting: Pharmacist

## 2016-03-19 ENCOUNTER — Ambulatory Visit (HOSPITAL_COMMUNITY)
Admission: RE | Admit: 2016-03-19 | Discharge: 2016-03-19 | Disposition: A | Discharge status: Home / Self Care | Payer: 59 | Source: Ambulatory Visit | Attending: Internal Medicine | Admitting: Internal Medicine

## 2016-03-19 DIAGNOSIS — Z95811 Presence of heart assist device: Secondary | ICD-10-CM | POA: Diagnosis not present

## 2016-03-19 DIAGNOSIS — R339 Retention of urine, unspecified: Secondary | ICD-10-CM

## 2016-03-19 LAB — PROTIME-INR
INR: 2.19
Prothrombin Time: 24.7 seconds — ABNORMAL HIGH (ref 11.4–15.2)

## 2016-03-19 MED ORDER — TAMSULOSIN HCL 0.4 MG PO CAPS: 0.4000 mg | ORAL_CAPSULE | Freq: Every day | ORAL | 3 refills | Status: DC

## 2016-03-19 NOTE — Addendum Note (Signed)
Encounter addended by: Kerry Dory, CMA on: 03/19/2016 12:58 PM<BR>    Actions taken: Order list changed, Diagnosis association updated

## 2016-03-23 ENCOUNTER — Other Ambulatory Visit (HOSPITAL_COMMUNITY): Payer: Self-pay | Admitting: Infectious Diseases

## 2016-03-23 DIAGNOSIS — I10 Essential (primary) hypertension: Secondary | ICD-10-CM

## 2016-03-23 DIAGNOSIS — E611 Iron deficiency: Secondary | ICD-10-CM

## 2016-03-23 MED ORDER — FERROUS SULFATE 324 (65 FE) MG PO TBEC: 1.0000 | DELAYED_RELEASE_TABLET | Freq: Two times a day (BID) | ORAL | 3 refills | Status: DC

## 2016-03-23 MED ORDER — AMLODIPINE BESYLATE 5 MG PO TABS: 5.0000 mg | ORAL_TABLET | Freq: Every day | ORAL | 3 refills | Status: DC

## 2016-03-26 ENCOUNTER — Other Ambulatory Visit (HOSPITAL_COMMUNITY): Payer: Self-pay | Admitting: *Deleted

## 2016-03-26 ENCOUNTER — Inpatient Hospital Stay (HOSPITAL_COMMUNITY): Admission: RE | Admit: 2016-03-26 | Payer: 59 | Source: Ambulatory Visit

## 2016-03-26 DIAGNOSIS — Z95811 Presence of heart assist device: Secondary | ICD-10-CM

## 2016-04-02 ENCOUNTER — Ambulatory Visit (HOSPITAL_COMMUNITY)
Admission: RE | Admit: 2016-04-02 | Discharge: 2016-04-02 | Disposition: A | Discharge status: Home / Self Care | Payer: 59 | Source: Ambulatory Visit | Attending: Internal Medicine | Admitting: Internal Medicine

## 2016-04-02 ENCOUNTER — Ambulatory Visit (HOSPITAL_COMMUNITY): Payer: Self-pay | Admitting: Pharmacist

## 2016-04-02 ENCOUNTER — Telehealth (HOSPITAL_COMMUNITY): Payer: Self-pay | Admitting: Infectious Diseases

## 2016-04-02 VITALS — BP 123/76 | HR 87 | Resp 18 | Wt 194.4 lb

## 2016-04-02 DIAGNOSIS — N183 Chronic kidney disease, stage 3 unspecified: Secondary | ICD-10-CM

## 2016-04-02 DIAGNOSIS — I5022 Chronic systolic (congestive) heart failure: Secondary | ICD-10-CM | POA: Diagnosis not present

## 2016-04-02 DIAGNOSIS — Z95811 Presence of heart assist device: Secondary | ICD-10-CM

## 2016-04-02 DIAGNOSIS — I480 Paroxysmal atrial fibrillation: Secondary | ICD-10-CM | POA: Diagnosis not present

## 2016-04-02 DIAGNOSIS — I10 Essential (primary) hypertension: Secondary | ICD-10-CM

## 2016-04-02 DIAGNOSIS — Z79899 Other long term (current) drug therapy: Secondary | ICD-10-CM | POA: Diagnosis not present

## 2016-04-02 DIAGNOSIS — I110 Hypertensive heart disease with heart failure: Secondary | ICD-10-CM | POA: Diagnosis not present

## 2016-04-02 DIAGNOSIS — Z5189 Encounter for other specified aftercare: Secondary | ICD-10-CM | POA: Diagnosis not present

## 2016-04-02 DIAGNOSIS — E875 Hyperkalemia: Secondary | ICD-10-CM

## 2016-04-02 LAB — BASIC METABOLIC PANEL
ANION GAP: 6 (ref 5–15)
BUN: 27 mg/dL — ABNORMAL HIGH (ref 6–20)
CHLORIDE: 115 mmol/L — AB (ref 101–111)
CO2: 17 mmol/L — ABNORMAL LOW (ref 22–32)
Calcium: 9 mg/dL (ref 8.9–10.3)
Creatinine, Ser: 1.87 mg/dL — ABNORMAL HIGH (ref 0.61–1.24)
GFR calc Af Amer: 48 mL/min — ABNORMAL LOW (ref >60)
GFR, EST NON AFRICAN AMERICAN: 41 mL/min — AB (ref >60)
Glucose, Bld: 84 mg/dL (ref 65–99)
POTASSIUM: 5.2 mmol/L — AB (ref 3.5–5.1)
SODIUM: 138 mmol/L (ref 135–145)

## 2016-04-02 LAB — PROTIME-INR
INR: 2.41
Prothrombin Time: 26.6 seconds — ABNORMAL HIGH (ref 11.4–15.2)

## 2016-04-02 LAB — CBC
HCT: 28.7 % — ABNORMAL LOW (ref 39.0–52.0)
Hemoglobin: 9.1 g/dL — ABNORMAL LOW (ref 13.0–17.0)
MCH: 22.2 pg — ABNORMAL LOW (ref 26.0–34.0)
MCHC: 31.7 g/dL (ref 30.0–36.0)
MCV: 70 fL — ABNORMAL LOW (ref 78.0–100.0)
PLATELETS: 127 10*3/uL — AB (ref 150–400)
RBC: 4.1 MIL/uL — AB (ref 4.22–5.81)
RDW: 15.8 % — AB (ref 11.5–15.5)
WBC: 4.7 10*3/uL (ref 4.0–10.5)

## 2016-04-02 LAB — BRAIN NATRIURETIC PEPTIDE: B NATRIURETIC PEPTIDE 5: 339.8 pg/mL — AB (ref 0.0–100.0)

## 2016-04-02 MED ORDER — SACUBITRIL-VALSARTAN 24-26 MG PO TABS: 1.0000 | ORAL_TABLET | Freq: Two times a day (BID) | ORAL | 3 refills | Status: DC

## 2016-04-02 NOTE — Progress Notes (Signed)
Patient ID: [name], male   DOB: Apr 16, 1971, 45 y.o.   MRN: [id number]   LVAD CLINIC NOTE  CHF: [place]   HPI: [name] is a 45 y/o male with systolic HF due to NICM (EF 20-25%) s/p [employer] ICD, chronic AF and gout who underwent HM II VAD implantation on 12/20/14. 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  Admitted from [facility] in [place] in 5/17 with LVAD driveline abscess. Hospitalization was complicated by septic shock, MSSA bacteremia, CDiff, and AKI. Prior to admit blood cultures from [facility] showed MSSA and C diff. Cardiac Surgery and ID consulted on admit.  He was taken to the operating room on May 9th for I and D of epigastric wound. Exposed LVAD hardware was noted. He returned to the OR May 11th for I and D and wound VAC placement. He also had a TEE that did not show any vegetation on valves or ICD wires. VAC later removed due to inability to seal. At that point he had sterile dressing changes. Plastic Surgery was consulted for complex wound with exposed hardware. On May 22nd he returned to the OR with ABRA wound closure device and application of ACell. Over the next several days ABRA device was tightened. On May 30th he returned to the OR for closure of abdominal wound. Over the course of his hospitalization he required multiple blood products due to symptomatic anemia and blood loss from abdominal wound.   ID followed closely through out his hospitalization. He was placed on broad spectrum antibiotics with adjustments made based on blood cultures. He was being treated for MSSA bacteremia, C diff, and klebseilla PNA. He has now completed Ancef, oral Vanc, and Rifampin. Urine with >100K yeast while in hospital and completed course of fluconazole.   Due to septic shock he developed AKI with after having MAPs down to the 40s. Initially requiring pressors and eventually weaned off. Nephrology was consulted and started CVVHD  and later transitioned to intermittent HD. Gradually he started making urine and on the day of discharge he was urinating over 2 liters a day.   Due to severe deconditioning PT consulted with recommendations for CIR. On 09/25/15, he was transferred to CIR in stable condition. He was continued on coumadin with INR goal 2-2.5.   He returns for LVAD follow up. Overall feeling very good. Denies SOB/PND/Orthopnea. Main complaint is LE edema related to amlodipine.Able to do all activities without undue dyspnea or fatigue. Seen at [facility] and now undergoing transplant w/u. Sees Dr. Emelia Loron soon. Has been seen by ID and abx stopped.  Appetite back to normal. No bleeding or neurologic complaints. Creatinine ahs been running 1.7.    VAD Indication: Bridge to Transplant - Appointment completed at [facility] this past week to meet with interdisciplinary team.   VAD interrogation & Equipment Management: Speed: 9200 Flow: 4.6 Power: 5.2 w  PI: 6.8 Alarms: None Events:PI events (1-5 per day) Fixed speed: 9200 Low speed limit: 8600 Primary Controller: Replace back up battery in 17 months. Back up controller: Replace back up battery in  months. Annual Equipment Maintenance on UBC/PM was performed on 12/2015.   Denies LVAD alarms.  Denies driveline trauma, erythema or drainage.  Denies ICD shocks.   Reports taking Coumadin as prescribed and adherence to anticoagulation based dietary restrictions.  Denies bright red blood per rectum or melena, no dark urine or hematuria.    Labs (5/17): K 5.5, creatinine 6.49 => 4.52, hgb 9, LDH 230,  INR 2.38 Labs (12/26/2015): K 3.9 Creatinine 1.52   RHC 11/2015 RA = 6 RV = 23/8 PA = 25/4 (19) PCW = 6 Fick cardiac output/index = 4.8/2.6 PVR = 2.4 WU Ao sat = 98% PA sat = 49%, 51% 1. Well compensated hemodynamics with VAD support. 2. PA sat low in the setting of marked anemia (Hgb 7.7 g/dl)     Past Medical History:  Diagnosis Date  . AICD (automatic  cardioverter/defibrillator) present   . AKI (acute kidney injury) ([place])   . Atrial fibrillation -parosysmal     Rx w amiodarone  . Automatic implantable cardiac defibrillator -BSX    single chamber  . CHF (congestive heart failure) ([place])   . Chronic systolic heart failure (HCC)    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%)  . Elevated LFTs   . GI bleed -massive    11 Units 2012  . Gout   . H/O hyperthyroidism   . Hypertension   . Noncompliance    H/O  MEDICAL NONCOMPLIANCE  . 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 Polymorphic ventricular tachycardia (HCC)    RECURRENT WITH APPROPRIATE SHOCK THERAPY IN THE PAST  . RA (rheumatoid arthritis) ([place])   . Severe mitral regurgitation   . Tricuspid valve regurgitation    SEVERE  . Ventricular fibrillation ([place])    WITH APPROPRIATE SHOCK THERAPY IN THE PAST    Current Outpatient Prescriptions  Medication Sig Dispense Refill  . allopurinol (ZYLOPRIM) 100 MG tablet Take 1 tablet (100 mg total) by mouth daily. 30 tablet 6  . citalopram (CELEXA) 20 MG tablet Take 1 tablet (20 mg total) by mouth daily. 30 tablet 6  . ferrous sulfate 324 (65 Fe) MG TBEC Take 1 tablet (325 mg total) by mouth 2 (two) times daily. 30 tablet 3  . furosemide (LASIX) 80 MG tablet Take 80 mg (1 tab) three times weekly Tues Thurs Sat 30 tablet 3  . oxyCODONE (OXY IR/ROXICODONE) 5 MG immediate release tablet Take 1 tablet (5 mg total) by mouth every 3 (three) hours as needed for moderate pain or breakthrough pain. 90 tablet 0  . potassium chloride SA (K-DUR,KLOR-CON) 20 MEQ tablet Take 80 meq (4 tabs) three times weekly Tues Thurs Sat 75 tablet 6  . pregabalin (LYRICA) 75 MG capsule Take 1 capsule (75 mg total) by mouth 2 (two) times daily. 60 capsule 2  . sildenafil (REVATIO) 20 MG tablet take 1 tablet by mouth three times a day 90 tablet 11  . spironolactone (ALDACTONE) 25 MG tablet Take 0.5 tablets (12.5 mg total) by  mouth daily. 30 tablet 1  . tamsulosin (FLOMAX) 0.4 MG CAPS capsule Take 1 capsule (0.4 mg total) by mouth daily. 90 capsule 3  . warfarin (COUMADIN) 4 MG tablet Take 1 tablet (4 mg total) by mouth one time only at 6 PM. 30 tablet 11  . zolpidem (AMBIEN) 10 MG tablet Take 10 mg by mouth at bedtime as needed for sleep.    . sacubitril-valsartan (ENTRESTO) 24-26 MG Take 1 tablet by mouth 2 (two) times daily. 60 tablet 3   No current facility-administered medications for this encounter.     Phytonadione  REVIEW OF SYSTEMS: All systems negative except as listed in HPI, PMH and Problem list.   VAD interrogation & Equipment Management:  LVAD Flow 4.5 Speed 9200 Power 5.2 PI 7.4 Rare PI  Vital Signs:  BP 123/76 (BP Location: Right Arm, Patient Position: Sitting,  Cuff Size: Normal) Comment: Auto MAP 89, Doppler 110  Pulse 87   Resp 18   Wt 194 lb 6 oz (88.2 kg)   SpO2 100%   BMI 30.44 kg/m   Vital Signs:  Doppler Pressure: 110 Automatc BP: 123/76 HR: 87 SPO2: 100 % Weight: 194lb 6oz (with batteries and controller) Last weight: 197 lbs Home weights: 182 lbs BMI today: 30.44   Physical Exam: GENERAL: Well appearing, male Ambulated in the clinic without difficulty  HEENT: normal  NECK: Supple, JVP 6-7 No lymphadenopathy or thyromegaly appreciated.   CARDIAC:  Mechanical heart sounds with LVAD hum present.  LUNGS:  Clear to auscultation bilaterally.  ABDOMEN:  Soft, round, nontender, positive bowel sounds x4.   Dressing intact   LVAD exit site:  Site is closing but there is still some drainage.  EXTREMITIES:  Warm and dry, no cyanosis, clubbing, rash, trace  edema  NEUROLOGIC:  Alert and oriented x 4.  Gait steady.  No aphasia.  No dysarthria.  Affect pleasant.      ASSESSMENT AND PLAN:   1. Chronic systolic HF: Status post HMII LVAD implantation 12/20/14.  S/P tatus post long hospitalization as complication of driveline infection in June 2017. He is doing very well. NYHA I. He  is being followed at [facility] for transplant. Had [place] on 12/18/15 with well compensated hemodynamics.  VAD parameters reviewed personally and are stable.  2. RV failure:  Now off milrinone.  Continue sildenafil.  Off digoxin with renal failure.  3. HTN: MAP improved with amlodipine but having edema. Stop amlodipine and switch to Entresto 24/26 bid.  4. Atrial fibrillation: Paroxysmal. In NSR today.   On coumadin for VAD. He is no longer on amiodarone.  5. Anticoagulation management: INR goal 2.0-2.5, no aspirin.    6 AKI: Seems to have Good UOP on Lasix 80 mg daily.  BMET today.    7. Driveline infection: wound completely healed.Has seen ID. Now off abx.        Glori Bickers MD 04/02/2016

## 2016-04-02 NOTE — Telephone Encounter (Signed)
Lab results discussed with Dr. Haroldine Laws. Will have patient stop Spironolactone with hyperkalemia and increased creatinine. Recheck BMET with next BP check.   [name], RN VAD Coordinator   Office: [phone] 24/7 Emergency VAD Pager: [phone]

## 2016-04-02 NOTE — Patient Instructions (Addendum)
STOP Amlodipine.  START Entresto 24/26mg  tablet: 1 tablet twice daily. Use 30 day free card first . Use copay card monthly.  Chest xray today.  Follow up 1 week with labs, dressing change, and BP check.

## 2016-04-03 NOTE — Progress Notes (Signed)
Patient presents for 8 week follow up, labs (bnp, cbc, inr, bmet), dressing change in [facility] today. Reports no problems with VAD equipment or concerns with drive line.  Vital Signs:  Doppler Pressure: 110 Automatc BP: 123/76 HR: 87 SPO2: 100 % Weight: 194lb 6oz (with batteries and controller) Last weight: 197 lbs Home weights: 182 lbs BMI today: 30.44  VAD Indication: Bridge to Transplant - Appointment completed at [facility] this past week to meet with interdisciplinary team.   VAD interrogation & Equipment Management: Speed: 9200 Flow: 4.6 Power: 5.2 w  PI: 6.8 Alarms: None Events:PI events (1-5 per day) Fixed speed: 9200 Low speed limit: 8600 Primary Controller: Replace back up battery in 17 months. Back up controller: Replace back up battery in  months. Annual Equipment Maintenance on UBC/PM was performed on 12/2015.   I reviewed the LVAD parameters from today and compared the results to the patient's prior recorded data. LVAD interrogation was NEGATIVE for significant power changes, NEGATIVE for clinical alarms and STABLE for PI events/speed drops. No programming changes were made and pump is functioning within specified parameters. Pt is performing daily controller and system monitor self tests along with completing weekly and monthly maintenance for LVAD equipment.  LVAD equipment check completed and is in good working order. Back-up equipment present. Charged back up battery and performed self-test on equipment.   Exit Site Care: Drive line is being maintained weekly by LVAD Coordinators. Drive line exit site well healed and incorporated. The velour is fully implanted at exit site. Dressing dry and intact. No erythema or drainage. Stabilization device present and accurately applied. Pt denies fever or chills. Pt states they have adequate dressing supplies at home.   Significant Events on VAD Support:  N/A  Labs:  Hgb: 9.1 (10.3 on 10/13) - No S/S of bleeding. Specifically  denies melena/BRBPR or nosebleeds. BNP: 339.8 (162.9 on 01/14/15) INR: 2.4 K 5.2 BUN/CR: 27/1.87 Denies tea-colored urine. No power elevations noted on interrogation.   Symptom Yes No Details  Angina  X Activity:  Claudication  X How far:  Syncope  X When:  Stroke  X   Orthopnea  X How many pillows:  PND  X How often:  CPAP  X How many hrs:  Pedal edema X  BLEE  Abd fullness X    N&V  X   Diaphoresis  X When:  Bleeding  X   Urine  X   SOB X  Activity: DOE  Palpitations  X When:  ICD shock  X   Hospitlizaitons  X When/where/why:  ED visit  X When/where/why:  Other MD   When/who/why: [place] WORK UP THIS WEEK  Activity X  Walking dog, work  Fluid X    Diet      Encounter Details: Patient c/o swelling in abd and BLEE since starting amlodipine accopmanied with DOE. Medication changes made today with lab work and dressing change. Routine CXR done before patient discharged from appointment.  Medication changes at this encounter: Stop amlodipine, start entresto 24/26 BID (30 day free and copay cards provided to patient), stop spironolactone (K 5.2)  Plan of care: Routine CXR today for LVAD placement, follow up 1 month with VAD apt and echo (no ramp). Will see patient weekly until then for lab work, dressing changes, and BP checks.  [name], RN

## 2016-04-09 ENCOUNTER — Ambulatory Visit (HOSPITAL_COMMUNITY)
Admission: RE | Admit: 2016-04-09 | Discharge: 2016-04-09 | Disposition: A | Discharge status: Home / Self Care | Payer: 59 | Source: Ambulatory Visit | Attending: Internal Medicine | Admitting: Internal Medicine

## 2016-04-09 ENCOUNTER — Ambulatory Visit (HOSPITAL_COMMUNITY): Payer: Self-pay | Admitting: Pharmacist

## 2016-04-09 DIAGNOSIS — Z95811 Presence of heart assist device: Secondary | ICD-10-CM | POA: Diagnosis not present

## 2016-04-09 DIAGNOSIS — E875 Hyperkalemia: Secondary | ICD-10-CM | POA: Diagnosis not present

## 2016-04-09 DIAGNOSIS — I5023 Acute on chronic systolic (congestive) heart failure: Secondary | ICD-10-CM | POA: Diagnosis not present

## 2016-04-09 LAB — PROTIME-INR
INR: 2.46
PROTHROMBIN TIME: 27.1 s — AB (ref 11.4–15.2)

## 2016-04-09 LAB — BASIC METABOLIC PANEL
Anion gap: 6 (ref 5–15)
BUN: 25 mg/dL — ABNORMAL HIGH (ref 6–20)
CALCIUM: 8.6 mg/dL — AB (ref 8.9–10.3)
CHLORIDE: 114 mmol/L — AB (ref 101–111)
CO2: 18 mmol/L — AB (ref 22–32)
CREATININE: 1.91 mg/dL — AB (ref 0.61–1.24)
GFR calc Af Amer: 47 mL/min — ABNORMAL LOW (ref >60)
GFR calc non Af Amer: 40 mL/min — ABNORMAL LOW (ref >60)
GLUCOSE: 93 mg/dL (ref 65–99)
Potassium: 4.7 mmol/L (ref 3.5–5.1)
Sodium: 138 mmol/L (ref 135–145)

## 2016-04-16 ENCOUNTER — Ambulatory Visit (HOSPITAL_COMMUNITY)
Admission: RE | Admit: 2016-04-16 | Discharge: 2016-04-16 | Disposition: A | Discharge status: Home / Self Care | Payer: 59 | Source: Ambulatory Visit | Attending: Cardiology | Admitting: Cardiology

## 2016-04-16 DIAGNOSIS — T829XXA Unspecified complication of cardiac and vascular prosthetic device, implant and graft, initial encounter: Secondary | ICD-10-CM

## 2016-04-16 DIAGNOSIS — Z4801 Encounter for change or removal of surgical wound dressing: Secondary | ICD-10-CM | POA: Insufficient documentation

## 2016-04-16 NOTE — Progress Notes (Signed)
Patient presents to clinic today for drive line exit wound care. Existing VAD dressing removed and site care performed using sterile technique. Drive line exit site cleaned with Chlora prep applicators x 2, allowed to dry, and Sorbaview dressing with bio patch re-applied. Exit site healed and incorporated, the velour is fully implanted at exit site. No redness, tenderness, drainage, foul odor or rash noted. Drive line anchor re-applied. Pt denies fever or chills.   Return in 1 week for another dressing change per standard of care. INR to be repeated in next week pending results per anticoagulation protocol.   [name], RN VAD Coordinator   Office: [phone] 24/7 Emergency VAD Pager: [phone]

## 2016-04-21 ENCOUNTER — Emergency Department (HOSPITAL_COMMUNITY)
Admission: EM | Admit: 2016-04-21 | Discharge: 2016-04-21 | Disposition: A | Discharge status: Home / Self Care | Payer: 59 | Attending: Emergency Medicine | Admitting: Emergency Medicine

## 2016-04-21 ENCOUNTER — Encounter (HOSPITAL_COMMUNITY): Payer: Self-pay

## 2016-04-21 DIAGNOSIS — I5022 Chronic systolic (congestive) heart failure: Secondary | ICD-10-CM | POA: Diagnosis not present

## 2016-04-21 DIAGNOSIS — Y92009 Unspecified place in unspecified non-institutional (private) residence as the place of occurrence of the external cause: Secondary | ICD-10-CM | POA: Insufficient documentation

## 2016-04-21 DIAGNOSIS — Y999 Unspecified external cause status: Secondary | ICD-10-CM | POA: Insufficient documentation

## 2016-04-21 DIAGNOSIS — I110 Hypertensive heart disease with heart failure: Secondary | ICD-10-CM | POA: Insufficient documentation

## 2016-04-21 DIAGNOSIS — Y9389 Activity, other specified: Secondary | ICD-10-CM | POA: Insufficient documentation

## 2016-04-21 DIAGNOSIS — Z95810 Presence of automatic (implantable) cardiac defibrillator: Secondary | ICD-10-CM | POA: Insufficient documentation

## 2016-04-21 DIAGNOSIS — S61411A Laceration without foreign body of right hand, initial encounter: Secondary | ICD-10-CM | POA: Insufficient documentation

## 2016-04-21 DIAGNOSIS — Z7901 Long term (current) use of anticoagulants: Secondary | ICD-10-CM | POA: Insufficient documentation

## 2016-04-21 DIAGNOSIS — E039 Hypothyroidism, unspecified: Secondary | ICD-10-CM | POA: Insufficient documentation

## 2016-04-21 DIAGNOSIS — W268XXA Contact with other sharp object(s), not elsewhere classified, initial encounter: Secondary | ICD-10-CM | POA: Insufficient documentation

## 2016-04-21 MED ORDER — LIDOCAINE-EPINEPHRINE (PF) 2 %-1:200000 IJ SOLN
10.0000 mL | Freq: Once | INTRAMUSCULAR | Status: AC
  Administered 2016-04-21: 10 mL
  Filled 2016-04-21: qty 20

## 2016-04-21 MED ORDER — LIDOCAINE-EPINEPHRINE (PF) 2 %-1:200000 IJ SOLN: 10.0000 mL | Freq: Once | INTRAMUSCULAR | Status: DC

## 2016-04-21 MED ORDER — LIDOCAINE-EPINEPHRINE 1 %-1:100000 IJ SOLN: 10.0000 mL | Freq: Once | INTRAMUSCULAR | Status: DC

## 2016-04-21 NOTE — ED Notes (Signed)
Pt is here with laceration to right hand and is on coumadin,.  Pt is an LVAD patient and here to have it checked.  Delegated to NS to notify LVAD RN of patient arrival.

## 2016-04-21 NOTE — ED Notes (Signed)
Pt c/o stinging in right hand.

## 2016-04-21 NOTE — Progress Notes (Signed)
  [name] is a 46 y/o male with systolic HF due to NICM (EF 20-25%) s/p [employer] ICD, chronic AF and gout who underwent HM II VAD implantation on 12/20/14.   Presented to [facility] with 1.5 - 2 cm laceration noted to R hand.   From "scraping" hand on vase beside his entryway at home after walking the dog.  Bleeding was easy to control per pt.  He is up to date on Tetanus shot. Reports having booster ~ 3 weeks ago.   VAD parameters stable. No S/S of HF.      LVAD Interrogation HM II:   Speed: 9200  Flow: 4.0  PI:  6.2  Power: 5.0  Pt has follow up in VAD clinic 04/23/16 for labwork. We will re-assess hand at this time.   No further assessment from VAD team needed at this time.   Reviewed with Dr. Tamala Julian "[address] [place], [place] 04/21/2016 10:04 AM

## 2016-04-21 NOTE — ED Provider Notes (Signed)
[facility] DEPT Provider Note   CSN: [id number] Arrival date & time: 04/21/16 5597     History    Chief Complaint  Patient presents with  . Extremity Laceration     HPI [name] is a 46 y.o. male.  46yo M w/ extensive PMH including CHF w/ LVAD on coumadin who p/w R hand laceration. This morning, the patient hit the back of his right hand on a vase sustaining a small laceration. He had a mild to moderate amount of bleeding but it seems to be controlled currently. He reports mild stinging to his right hand but normal sensation, normal strength, normal function of his right hand. He denies any shattered glass or possibility of foreign body. He is up-to-date on tetanus.  Past Medical History:  Diagnosis Date  . AICD (automatic cardioverter/defibrillator) present   . AKI (acute kidney injury) ([place])   . Atrial fibrillation -parosysmal     Rx w amiodarone  . Automatic implantable cardiac defibrillator -BSX    single chamber  . CHF (congestive heart failure) ([place])   . Chronic systolic heart failure (HCC)    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%)  . Elevated LFTs   . GI bleed -massive    11 Units 2012  . Gout   . H/O hyperthyroidism   . Hypertension   . Noncompliance    H/O  MEDICAL NONCOMPLIANCE  . 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 Polymorphic ventricular tachycardia (HCC)    RECURRENT WITH APPROPRIATE SHOCK THERAPY IN THE PAST  . RA (rheumatoid arthritis) ([place])   . Severe mitral regurgitation   . Tricuspid valve regurgitation    SEVERE  . Ventricular fibrillation ([place])    WITH APPROPRIATE SHOCK THERAPY IN THE PAST     Patient Active Problem List   Diagnosis Date Noted  . PAF (paroxysmal atrial fibrillation) ([place]) 11/18/2015  . Wound infection   . Debility 09/25/2015  . Debilitated 09/25/2015  . Chronic systolic congestive heart failure ([place])   . Colitis   . Septic shock ([place])   . Enteritis due to  Clostridium difficile   . Staphylococcus aureus bacteremia   . 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[place])   . Abscess of abdominal wall   . Acute on chronic systolic (congestive) heart failure   . Left ventricular assist device (LVAD) complication 41/63/8453  . Epistaxis 05/21/2015  . Situational depression 04/13/2015  . Acute gout 01/15/2015  . Palliative care encounter 12/17/2014  . Cardiogenic shock ([place])   . Acute on chronic systolic CHF (congestive heart failure) ([place])   . Acute on chronic renal failure ([place])   . Chronic atrial fibrillation ([place])   . Low output heart failure ([place]) 12/14/2014  . Acute abdominal pain   . Near syncope   . Hypotension 11/30/2014  . AKI (acute kidney injury) ([place])   . Abnormal LFTs 10/26/2012  . Hyperthyroidism 05/26/2011  . 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 ICD-[employer] 07/22/2010  . Nonischemic cardiomyopathy ([place]) 07/22/2010  . Essential hypertension, benign 11/21/2008  . SYSTOLIC HEART FAILURE, CHRONIC 10/23/2008    Past Surgical History:  Procedure Laterality Date  . APPLICATION OF A-CELL OF EXTREMITY N/A 09/05/2015   Procedure: APPLICATION OF A-CELL OF sternum;  Surgeon: Loel Lofty Dillingham, DO;  Location: [facility];  Service: Plastics;  Laterality: N/A;  . APPLICATION OF WOUND VAC N/A 08/27/2015   Procedure: APPLICATION OF WOUND VAC;  Surgeon: Ivin Poot, MD;  Location: [facility];  Service: Thoracic;  Laterality: N/A;  . APPLICATION OF WOUND VAC N/A 08/29/2015   Procedure: WOUND VAC CHANGE;  Surgeon: Ivin Poot, MD;  Location: [facility];  Service: Thoracic;  Laterality: N/A;  . APPLICATION OF WOUND VAC N/A 09/05/2015   Procedure: APPLICATION OF WOUND VAC to sternum;  Surgeon: Loel Lofty Dillingham, DO;  Location: [facility];  Service: Plastics;  Laterality: N/A;  . CARDIAC CATHETERIZATION  06/2006   RIGHT HEART CATH SHOWING SEVERE BIVENTRICUALR CHF WITH MARKED FILLING AND PRESSURES  . CARDIAC CATHETERIZATION N/A  12/14/2014   Procedure: Right Heart Cath;  Surgeon: Jolaine Artist, MD;  Location: [facility];  Service: Cardiovascular;  Laterality: N/A;  . CARDIAC CATHETERIZATION N/A 12/14/2014   Procedure: IABP Insertion;  Surgeon: Jolaine Artist, MD;  Location: [facility];  Service: Cardiovascular;  Laterality: N/A;  . CARDIAC CATHETERIZATION N/A 12/18/2015   Procedure: Right Heart Cath;  Surgeon: Jolaine Artist, MD;  Location: [facility];  Service: Cardiovascular;  Laterality: N/A;  . CHOLECYSTECTOMY    . ESOPHAGOGASTRODUODENOSCOPY N/A 09/17/2015   Procedure: ESOPHAGOGASTRODUODENOSCOPY (EGD);  Surgeon: Carol Ada, MD;  Location: [facility];  Service: Gastroenterology;  Laterality: N/A;  . IMPLANTABLE CARDIOVERTER DEFIBRILLATOR GENERATOR CHANGE N/A 07/01/2011   Procedure: IMPLANTABLE CARDIOVERTER DEFIBRILLATOR GENERATOR CHANGE;  Surgeon: Deboraha Sprang, MD;  Location[facility] C[facility];  Service: Cardiovascular;  Laterality: N/A;  . INCISION AND DRAINAGE OF WOUND N/A 09/05/2015   Procedure: IRRIGATION AND DEBRIDEMENT sternal WOUND;  Surgeon: Loel Lofty Dillingham, DO;  Location: [facility];  Service: Plastics;  Laterality: N/A;  . INSERT / REPLACE / REMOVE PACEMAKER     GUIDANT HE ICD MODEL 2180, SERIAL # [id number]  . INSERTION OF DIALYSIS CATHETER Right 09/05/2015   Procedure: INSERTION OF DIALYSIS CATHETER;RIGHT SUBCLAVIAN;  Surgeon: Loel Lofty Dillingham, DO;  Location: [facility];  Service: Plastics;  Laterality: Right;  . INSERTION OF IMPLANTABLE LEFT VENTRICULAR ASSIST DEVICE N/A 12/20/2014   Procedure: INSERTION OF IMPLANTABLE LEFT VENTRICULAR ASSIST DEVICE;  Surgeon: Ivin Poot, MD;  Location: [facility];  Service: Open Heart Surgery;  Laterality: N/A;  CIRC ARREST  NITRIC OXIDE  . PECTORALIS FLAP N/A 09/09/2015   Procedure: DEBRIDEMENT AND CLOSURE WOUND WITH PLACEMENT OF ABRA CLOSURE DEVICE;  Surgeon: Loel Lofty Dillingham, DO;  Location: [facility];  Service: Plastics;  Laterality: N/A;  . STERNAL  CLOSURE N/A 09/17/2015   Procedure: ABDOMINAL WOUND CLOSURE WITH INCISIONAL VAC APPLICATION;  Surgeon: Ivin Poot, MD;  Location: [facility];  Service: Thoracic;  Laterality: N/A;  . STERNAL WOUND DEBRIDEMENT N/A 08/27/2015   Procedure: WOUND IRRIGATION AND DEBRIDEMENT;  Surgeon: Ivin Poot, MD;  Location: [facility];  Service: Thoracic;  Laterality: N/A;  . STERNAL WOUND DEBRIDEMENT N/A 08/29/2015   Procedure: DEBRIDEMENT OF chest wound;  Surgeon: Ivin Poot, MD;  Location: [facility];  Service: Thoracic;  Laterality: N/A;  . TEE WITHOUT CARDIOVERSION N/A 12/12/2014   Procedure: TRANSESOPHAGEAL ECHOCARDIOGRAM (TEE);  Surgeon: Jerline Pain, MD;  Location: [facility];  Service: Cardiovascular;  Laterality: N/A;  . TEE WITHOUT CARDIOVERSION N/A 12/20/2014   Procedure: TRANSESOPHAGEAL ECHOCARDIOGRAM (TEE);  Surgeon: Ivin Poot, MD;  Location: [facility];  Service: Open Heart Surgery;  Laterality: N/A;        Home Medications    Prior to Admission medications   Medication Sig Start Date End Date Taking? Authorizing Provider  allopurinol (ZYLOPRIM) 100 MG tablet Take 1 tablet (  100 mg total) by mouth daily. 10/02/15  Yes Daniel J Angiulli, PA-C  citalopram (CELEXA) 20 MG tablet Take 1 tablet (20 mg total) by mouth daily. 10/02/15  Yes Daniel J Angiulli, PA-C  ferrous sulfate 324 (65 Fe) MG TBEC Take 1 tablet (325 mg total) by mouth 2 (two) times daily. 03/23/16  Yes Jolaine Artist, MD  pregabalin (LYRICA) 75 MG capsule Take 1 capsule (75 mg total) by mouth 2 (two) times daily. 03/05/16  Yes Shaune Pascal [name], MD  sacubitril-valsartan (ENTRESTO) 24-26 MG Take 1 tablet by mouth 2 (two) times daily. 04/02/16  Yes Jolaine Artist, MD  tamsulosin (FLOMAX) 0.4 MG CAPS capsule Take 1 capsule (0.4 mg total) by mouth daily. Patient taking differently: Take 0.4 mg by mouth daily at 12 noon.  03/19/16  Yes Jolaine Artist, MD  warfarin (COUMADIN) 4 MG tablet Take 1 tablet (4 mg total) by mouth one time  only at 6 PM. Patient taking differently: Take 5 mg by mouth every evening.  11/06/15  Yes Larey Dresser, MD  furosemide (LASIX) 80 MG tablet Take 80 mg (1 tab) three times weekly Tues Thurs Sat Patient not taking: Reported on 04/21/2016 02/12/16   Larey Dresser, MD  oxyCODONE (OXY IR/ROXICODONE) 5 MG immediate release tablet Take 1 tablet (5 mg total) by mouth every 3 (three) hours as needed for moderate pain or breakthrough pain. 10/02/15   Lavon Paganini Angiulli, PA-C  potassium chloride SA (K-DUR,KLOR-CON) 20 MEQ tablet Take 80 meq (4 tabs) three times weekly Tues Thurs Sat Patient not taking: Reported on 04/21/2016 02/12/16   Larey Dresser, MD  sildenafil (REVATIO) 20 MG tablet take 1 tablet by mouth three times a day Patient taking differently: Take 20 mg by mouth 3 times daily 01/23/16   Jolaine Artist, MD  zolpidem (AMBIEN) 10 MG tablet Take 10 mg by mouth at bedtime as needed for sleep.    Historical Provider, MD      Family History  Problem Relation Age of Onset  . Heart failure Brother      Social History  Substance Use Topics  . Smoking status: Never Smoker  . Smokeless tobacco: Never Used  . Alcohol use No     Allergies     Phytonadione    Review of Systems  10 Systems reviewed and are negative for acute change except as noted in the HPI.   Physical Exam Updated Vital Signs BP 108/79 (BP Location: Left Arm)   Pulse 81   Temp 98.2 F (36.8 C) (Oral)   Resp 16   Ht 5\' 7"  (1.702 m)   Wt 178 lb (80.7 kg)   SpO2 99%   BMI 27.88 kg/m   Physical Exam  Constitutional: He is oriented to person, place, and time. He appears well-developed and well-nourished. No distress.  HENT:  Head: Normocephalic and atraumatic.  Eyes: Conjunctivae are normal.  Neck: Neck supple.  Musculoskeletal: Normal range of motion. He exhibits no tenderness or deformity.  Normal grip strength and sensation R hand, normal strength and ROM of all fingers  Neurological: He is alert  and oriented to person, place, and time.  Skin: Skin is warm and dry. Capillary refill takes less than 2 seconds.  1.5cm laceration dorsal R hand near 3rd MCP joint, no tendon exposure, hemostatic  Psychiatric: He has a normal mood and affect. Judgment normal.  Nursing note and vitals reviewed.     ED Treatments / Results  Labs (all  labs ordered are listed, but only abnormal results are displayed) Labs Reviewed - No data to display   EKG  EKG Interpretation  Date/Time:    Ventricular Rate:    PR Interval:    QRS Duration:   QT Interval:    QTC Calculation:   R Axis:     Text Interpretation:           Radiology No results found.  Procedures Procedures (including critical care time) .Marland KitchenLaceration Repair Date/Time: 04/21/2016 11:02 AM Performed by: Sharlett Iles Authorized by: Sharlett Iles   Consent:    Consent obtained:  Verbal   Consent given by:  Patient   Alternatives discussed:  No treatment Anesthesia (see MAR for exact dosages):    Anesthesia method:  Local infiltration   Local anesthetic:  Lidocaine 2% WITH epi Laceration details:    Location:  Hand   Hand location:  R hand, dorsum   Length (cm):  1.5 Repair type:    Repair type:  Simple Pre-procedure details:    Preparation:  Patient was prepped and draped in usual sterile fashion Exploration:    Wound exploration: wound explored through full range of motion     Contaminated: no   Treatment:    Area cleansed with:  Betadine   Amount of cleaning:  Standard   Irrigation solution:  Sterile saline   Irrigation volume:  60 ml   Irrigation method:  Syringe   Visualized foreign bodies/material removed: no   Skin repair:    Repair method:  Sutures   Suture size:  4-0   Suture material:  Nylon   Number of sutures:  2 Approximation:    Approximation:  Close Post-procedure details:    Dressing:  Antibiotic ointment and adhesive bandage   Patient tolerance of procedure:  Tolerated  well, no immediate complications    Medications Ordered in ED  Medications  lidocaine-EPINEPHrine (XYLOCAINE W/EPI) 2 %-1:200000 (PF) injection 10 mL (10 mLs Other Given 04/21/16 1041)     Initial Impression / Assessment and Plan / ED Course  I have reviewed the triage vital signs and the nursing notes.    Clinical Course     PT w/ small R hand lac, on coumadin. Bleeding controlled on my exam. Cleaned wound, repaired at bedside. See proc note. Just supportive care, follow-up planned for suture removal, and return precautions including excessive bleeding or any signs of infection. Patient voiced understanding and was discharged in satisfactory condition.  Final Clinical Impressions(s) / ED Diagnoses   Final diagnoses:  Laceration of right hand without foreign body, initial encounter     New Prescriptions   No medications on file       Sharlett Iles, MD 04/21/16 1103

## 2016-04-21 NOTE — ED Notes (Signed)
Pt in NAD on discharge. Pt ambulatory with friend. Pt discharged home.

## 2016-04-21 NOTE — ED Triage Notes (Addendum)
Pt presents for evaluation of laceration to L hand. Pt reports he cut finger on vase, reports being on coumadin for LVAD. Bleeding controlled at this time. Pt reports seen by LVAD team PTA. Pt. AxO x4, no complaints of CP/SOB. Pt. Ambulatory in triage.

## 2016-04-23 ENCOUNTER — Other Ambulatory Visit (HOSPITAL_COMMUNITY): Payer: Self-pay | Admitting: Infectious Diseases

## 2016-04-23 ENCOUNTER — Ambulatory Visit (HOSPITAL_BASED_OUTPATIENT_CLINIC_OR_DEPARTMENT_OTHER)
Admission: RE | Admit: 2016-04-23 | Discharge: 2016-04-23 | Disposition: A | Discharge status: Home / Self Care | Payer: 59 | Source: Ambulatory Visit | Attending: Internal Medicine | Admitting: Internal Medicine

## 2016-04-23 ENCOUNTER — Ambulatory Visit (HOSPITAL_COMMUNITY): Payer: Self-pay | Admitting: Infectious Diseases

## 2016-04-23 ENCOUNTER — Emergency Department (HOSPITAL_COMMUNITY)
Admission: EM | Admit: 2016-04-23 | Discharge: 2016-04-23 | Disposition: A | Discharge status: Home / Self Care | Payer: 59 | Attending: Physician Assistant | Admitting: Physician Assistant

## 2016-04-23 ENCOUNTER — Encounter (HOSPITAL_COMMUNITY): Payer: Self-pay | Admitting: Emergency Medicine

## 2016-04-23 DIAGNOSIS — Z5181 Encounter for therapeutic drug level monitoring: Secondary | ICD-10-CM

## 2016-04-23 DIAGNOSIS — Z5189 Encounter for other specified aftercare: Secondary | ICD-10-CM

## 2016-04-23 DIAGNOSIS — Z7901 Long term (current) use of anticoagulants: Secondary | ICD-10-CM

## 2016-04-23 DIAGNOSIS — Z4801 Encounter for change or removal of surgical wound dressing: Secondary | ICD-10-CM | POA: Insufficient documentation

## 2016-04-23 DIAGNOSIS — I110 Hypertensive heart disease with heart failure: Secondary | ICD-10-CM | POA: Diagnosis not present

## 2016-04-23 DIAGNOSIS — Z79899 Other long term (current) drug therapy: Secondary | ICD-10-CM | POA: Insufficient documentation

## 2016-04-23 DIAGNOSIS — Z95810 Presence of automatic (implantable) cardiac defibrillator: Secondary | ICD-10-CM | POA: Insufficient documentation

## 2016-04-23 DIAGNOSIS — I5023 Acute on chronic systolic (congestive) heart failure: Secondary | ICD-10-CM | POA: Diagnosis not present

## 2016-04-23 LAB — PROTIME-INR
INR: 1.74
PROTHROMBIN TIME: 20.5 s — AB (ref 11.4–15.2)

## 2016-04-23 NOTE — ED Triage Notes (Signed)
Pt. reports bleeding at suture site  at right hand this evening , no bleeding at arrival / dressing applied at triage .

## 2016-04-23 NOTE — Progress Notes (Signed)
Patient presents to clinic today for drive line exit wound care. Existing VAD dressing removed and site care performed using sterile technique. Drive line exit site cleaned with Chlora prep applicators x 2, allowed to dry, and [place]av[place] dressing with bio patch re-applied. Exit site healed and incorporated, the velour is fully implanted at exit site. No redness, tenderness, drainage, foul odor or rash noted. Drive line anchor re-applied. Pt denies fever or chills.   Return in 1 week for another dressing change per standard of care. INR to be repeated in next week pending results per anticoagulation protocol.   [name], RN VAD Coordinator   Office: [phone] 24/7 Emergency VAD Pager: [phone]

## 2016-04-23 NOTE — ED Provider Notes (Signed)
[facility] DEPT Provider Note   CSN: [id number] Arrival date & time: 04/23/16  2030  By signing my name below, I, [name], attest that this documentation has been prepared under the direction and in the presence of [facility], [place].  Electronically Signed: Reola [name], ED Scribe. 04/23/16. 11:07 PM.  History   Chief Complaint Chief Complaint  Patient presents with  . Suture problem   The history is provided by the patient. No language interpreter was used.  Wound Check  This is a new problem. The current episode started more than 2 days ago. The problem occurs constantly. The problem has been gradually improving. Nothing aggravates the symptoms. Nothing relieves the symptoms. He has tried nothing for the symptoms.   HPI Comments: [name] is a 46 y.o. male with a PMHx of CHF w/ LVAD placement (on Coumadin) who presents to the Emergency Department for a wound check following a laceration repair which occurred to the dorsal aspect of the right hand two days ago. Per prior chart review, pt sustained a wound to the dorsal aspect of the right hand two days ago, and at that time his laceration repaired two 4-0 Nylon sutures well and w/o immediate complication. However, this morning the site of repair began to ooze blood. This has since resolved. There are no other associated symptoms, alleviating, or exacerbating factors. He denies numbness, weakness, or any other associated symptoms.   Past Medical History:  Diagnosis Date  . AICD (automatic cardioverter/defibrillator) present   . AKI (acute kidney injury) ([place])   . Atrial fibrillation -parosysmal     Rx w amiodarone  . Automatic implantable cardiac defibrillator -BSX    single chamber  . CHF (congestive heart failure) ([place])   . Chronic systolic heart failure (HCC)    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%)  . Elevated LFTs   . GI bleed -massive    11 Units 2012  . Gout   . H/O hyperthyroidism    . Hypertension   . Noncompliance    H/O  MEDICAL NONCOMPLIANCE  . 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 Polymorphic ventricular tachycardia (HCC)    RECURRENT WITH APPROPRIATE SHOCK THERAPY IN THE PAST  . RA (rheumatoid arthritis) ([place])   . Severe mitral regurgitation   . Tricuspid valve regurgitation    SEVERE  . Ventricular fibrillation ([place])    WITH APPROPRIATE SHOCK THERAPY IN THE PAST   Patient Active Problem List   Diagnosis Date Noted  . PAF (paroxysmal atrial fibrillation) ([place]) 11/18/2015  . Wound infection   . Debility 09/25/2015  . Debilitated 09/25/2015  . Chronic systolic congestive heart failure ([place])   . Colitis   . Septic shock ([place])   . Enteritis due to Clostridium difficile   . Staphylococcus aureus bacteremia   . 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[place])   . Abscess of abdominal wall   . Acute on chronic systolic (congestive) heart failure   . Left ventricular assist device (LVAD) complication 75/01/2584  . Epistaxis 05/21/2015  . Situational depression 04/13/2015  . Acute gout 01/15/2015  . Palliative care encounter 12/17/2014  . Cardiogenic shock ([place])   . Acute on chronic systolic CHF (congestive heart failure) ([place])   . Acute on chronic renal failure ([place])   . Chronic atrial fibrillation ([place])   . Low output heart failure ([place]) 12/14/2014  . Acute abdominal pain   . Near syncope   .  Hypotension 11/30/2014  . AKI (acute kidney injury) ([place])   . Abnormal LFTs 10/26/2012  . Hyperthyroidism 05/26/2011  . 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 ICD-[employer] 07/22/2010  . Nonischemic cardiomyopathy ([place]) 07/22/2010  . Essential hypertension, benign 11/21/2008  . SYSTOLIC HEART FAILURE, CHRONIC 10/23/2008   Past Surgical History:  Procedure Laterality Date  . APPLICATION OF A-CELL OF EXTREMITY N/A 09/05/2015   Procedure: APPLICATION OF A-CELL OF sternum;  Surgeon: Loel Lofty  Dillingham, DO;  Location: [place];  Service: Plastics;  Laterality: N/A;  . APPLICATION OF WOUND VAC N/A 08/27/2015   Procedure: APPLICATION OF WOUND VAC;  Surgeon: Ivin Poot, MD;  Location: [place];  Service: Thoracic;  Laterality: N/A;  . APPLICATION OF WOUND VAC N/A 08/29/2015   Procedure: WOUND VAC CHANGE;  Surgeon: Ivin Poot, MD;  Location: [place];  Service: Thoracic;  Laterality: N/A;  . APPLICATION OF WOUND VAC N/A 09/05/2015   Procedure: APPLICATION OF WOUND VAC to sternum;  Surgeon: Loel Lofty Dillingham, DO;  Location: [place];  Service: Plastics;  Laterality: N/A;  . CARDIAC CATHETERIZATION  06/2006   RIGHT HEART CATH SHOWING SEVERE BIVENTRICUALR CHF WITH MARKED FILLING AND PRESSURES  . CARDIAC CATHETERIZATION N/A 12/14/2014   Procedure: Right Heart Cath;  Surgeon: Jolaine Artist, MD;  Location: [place] CV LAB;  Service: Cardiovascular;  Laterality: N/A;  . CARDIAC CATHETERIZATION N/A 12/14/2014   Procedure: IABP Insertion;  Surgeon: Jolaine Artist, MD;  Location: [place] CV LAB;  Service: Cardiovascular;  Laterality: N/A;  . CARDIAC CATHETERIZATION N/A 12/18/2015   Procedure: Right Heart Cath;  Surgeon: Jolaine Artist, MD;  Location: [place] [facility];  Service: Cardiovascular;  Laterality: N/A;  . CHOLECYSTECTOMY    . ESOPHAGOGASTRODUODENOSCOPY N/A 09/17/2015   Procedure: ESOPHAGOGASTRODUODENOSCOPY (EGD);  Surgeon: Carol Ada, MD;  Location: [place];  Service: Gastroenterology;  Laterality: N/A;  . IMPLANTABLE CARDIOVERTER DEFIBRILLATOR GENERATOR CHANGE N/A 07/01/2011   Procedure: IMPLANTABLE CARDIOVERTER DEFIBRILLATOR GENERATOR CHANGE;  Surgeon: Deboraha Sprang, MD;  Location: [facility] C[facility];  Service: Cardiovascular;  Laterality: N/A;  . INCISION AND DRAINAGE OF WOUND N/A 09/05/2015   Procedure: IRRIGATION AND DEBRIDEMENT sternal WOUND;  Surgeon: Loel Lofty Dillingham, DO;  Location: [place];  Service: Plastics;  Laterality: N/A;  . INSERT / REPLACE / REMOVE PACEMAKER      GUIDANT HE ICD MODEL 2180, SERIAL # [id number]  . INSERTION OF DIALYSIS CATHETER Right 09/05/2015   Procedure: INSERTION OF DIALYSIS CATHETER;RIGHT SUBCLAVIAN;  Surgeon: Loel Lofty Dillingham, DO;  Location: [place];  Service: Plastics;  Laterality: Right;  . INSERTION OF IMPLANTABLE LEFT VENTRICULAR ASSIST DEVICE N/A 12/20/2014   Procedure: INSERTION OF IMPLANTABLE LEFT VENTRICULAR ASSIST DEVICE;  Surgeon: Ivin Poot, MD;  Location: [place];  Service: Open Heart Surgery;  Laterality: N/A;  CIRC ARREST  NITRIC OXIDE  . PECTORALIS FLAP N/A 09/09/2015   Procedure: DEBRIDEMENT AND CLOSURE WOUND WITH PLACEMENT OF ABRA CLOSURE DEVICE;  Surgeon: Loel Lofty Dillingham, DO;  Location: [place];  Service: Plastics;  Laterality: N/A;  . STERNAL CLOSURE N/A 09/17/2015   Procedure: ABDOMINAL WOUND CLOSURE WITH INCISIONAL VAC APPLICATION;  Surgeon: Ivin Poot, MD;  Location: [place];  Service: Thoracic;  Laterality: N/A;  . STERNAL WOUND DEBRIDEMENT N/A 08/27/2015   Procedure: WOUND IRRIGATION AND DEBRIDEMENT;  Surgeon: Ivin Poot, MD;  Location: [place];  Service: Thoracic;  Laterality: N/A;  . STERNAL WOUND DEBRIDEMENT N/A 08/29/2015   Procedure: DEBRIDEMENT OF chest wound;  Surgeon: Ivin Poot, MD;  Location: [place];  Service: Thoracic;  Laterality: N/A;  . TEE WITHOUT CARDIOVERSION N/A 12/12/2014   Procedure: TRANSESOPHAGEAL ECHOCARDIOGRAM (TEE);  Surgeon: Jerline Pain, MD;  Location: [facility];  Service: Cardiovascular;  Laterality: N/A;  . TEE WITHOUT CARDIOVERSION N/A 12/20/2014   Procedure: TRANSESOPHAGEAL ECHOCARDIOGRAM (TEE);  Surgeon: Ivin Poot, MD;  Location: [place];  Service: Open Heart Surgery;  Laterality: N/A;    Home Medications    Prior to Admission medications   Medication Sig Start Date End Date Taking? Authorizing Provider  allopurinol (ZYLOPRIM) 100 MG tablet Take 1 tablet (100 mg total) by mouth daily. 10/02/15   Lavon Paganini Angiulli, PA-C  citalopram (CELEXA) 20 MG tablet Take 1 tablet  (20 mg total) by mouth daily. 10/02/15   Lavon Paganini Angiulli, PA-C  ferrous sulfate 324 (65 Fe) MG TBEC Take 1 tablet (325 mg total) by mouth 2 (two) times daily. 03/23/16   Jolaine Artist, MD  furosemide (LASIX) 80 MG tablet Take 80 mg (1 tab) three times weekly Tues Thurs Sat Patient not taking: Reported on 04/21/2016 02/12/16   Larey Dresser, MD  oxyCODONE (OXY IR/ROXICODONE) 5 MG immediate release tablet Take 1 tablet (5 mg total) by mouth every 3 (three) hours as needed for moderate pain or breakthrough pain. 10/02/15   Lavon Paganini Angiulli, PA-C  potassium chloride SA (K-DUR,KLOR-CON) 20 MEQ tablet Take 80 meq (4 tabs) three times weekly Tues Thurs Sat Patient not taking: Reported on 04/21/2016 02/12/16   Larey Dresser, MD  pregabalin (LYRICA) 75 MG capsule Take 1 capsule (75 mg total) by mouth 2 (two) times daily. 03/05/16   Shaune Pascal [name], MD  sacubitril-valsartan (ENTRESTO) 24-26 MG Take 1 tablet by mouth 2 (two) times daily. 04/02/16   Jolaine Artist, MD  sildenafil (REVATIO) 20 MG tablet take 1 tablet by mouth three times a day Patient taking differently: Take 20 mg by mouth 3 times daily 01/23/16   Jolaine Artist, MD  tamsulosin (FLOMAX) 0.4 MG CAPS capsule Take 1 capsule (0.4 mg total) by mouth daily. Patient taking differently: Take 0.4 mg by mouth daily at 12 noon.  03/19/16   Jolaine Artist, MD  warfarin (COUMADIN) 4 MG tablet Take 1 tablet (4 mg total) by mouth one time only at 6 PM. Patient taking differently: Take 5 mg by mouth every evening.  11/06/15   Larey Dresser, MD  zolpidem (AMBIEN) 10 MG tablet Take 10 mg by mouth at bedtime as needed for sleep.    Historical Provider, MD   Family History Family History  Problem Relation Age of Onset  . Heart failure Brother    Social History Social History  Substance Use Topics  . Smoking status: Never Smoker  . Smokeless tobacco: Never Used  . Alcohol use No   Allergies   Phytonadione  Review of  Systems Review of Systems  Skin: Positive for wound.  Neurological: Negative for weakness and numbness.  All other systems reviewed and are negative.  Physical Exam Updated Vital Signs BP 119/84   Pulse 72   Temp 98.7 F (37.1 C) (Oral)   Resp 16   Ht 5\' 7"  (1.702 m)   Wt 127 kg   SpO2 100%   BMI 43.85 kg/m   Physical Exam  Constitutional: He appears well-developed and well-nourished. No distress.  HENT:  Head: Normocephalic and atraumatic.  Eyes: Conjunctivae are normal.  Neck: Normal range of motion.  Cardiovascular: Normal rate.   Pulmonary/Chest: Effort normal.  Abdominal: He exhibits no distension.  Musculoskeletal: Normal range of motion.  Repaired laceration to the dorsal right hand near the third MCP. Area appears well healing, and without erythema, edema, drainage, or other signs of surrounding infection.   Neurological: He is alert.  Skin: No erythema. No pallor.  Psychiatric: He has a normal mood and affect. His behavior is normal.  Nursing note and vitals reviewed.  ED Treatments / Results  DIAGNOSTIC STUDIES: Oxygen Saturation is 100% on RA, normal by my interpretation.   COORDINATION OF CARE: 11:08 PM-Discussed next steps with pt. Pt verbalized understanding and is agreeable with the plan.   Labs (all labs ordered are listed, but only abnormal results are displayed) Labs Reviewed - No data to display   Radiology No results found.  Procedures Procedures   Medications Ordered in ED Medications - No data to display  Initial Impression / Assessment and Plan / ED Course  I have reviewed the triage vital signs and the nursing notes.   Clinical Course    Patient returns for check of recent laceration repair. The region appears to be well-healing and without infection. Afebrile and hemodynamically stable. Pt is instructed to continue with home care Pt has a good understanding of return precautions and is safe for discharge at this time.  Final  Clinical Impressions(s) / ED Diagnoses   Final diagnoses:  Visit for wound check   New Prescriptions Discharge Medication List as of 04/23/2016 11:07 PM     *I personally performed the services described in this documentation, which was scribed in my presence. The recorded information has been reviewed and is accurate.     [place], NP 04/24/16 0308    Macarthur Critchley, MD 04/24/16 [phone]

## 2016-04-30 ENCOUNTER — Ambulatory Visit (HOSPITAL_COMMUNITY): Payer: Self-pay | Admitting: Pharmacist

## 2016-04-30 LAB — PROTIME-INR: INR: 1.8 — AB (ref <=1.1)

## 2016-05-01 ENCOUNTER — Ambulatory Visit (HOSPITAL_COMMUNITY)
Admission: RE | Admit: 2016-05-01 | Discharge: 2016-05-01 | Disposition: A | Discharge status: Home / Self Care | Payer: 59 | Source: Ambulatory Visit | Attending: Internal Medicine | Admitting: Internal Medicine

## 2016-05-01 ENCOUNTER — Other Ambulatory Visit (HOSPITAL_COMMUNITY): Payer: 59

## 2016-05-01 ENCOUNTER — Ambulatory Visit (HOSPITAL_BASED_OUTPATIENT_CLINIC_OR_DEPARTMENT_OTHER)
Admission: RE | Admit: 2016-05-01 | Discharge: 2016-05-01 | Disposition: A | Discharge status: Home / Self Care | Payer: 59 | Source: Ambulatory Visit | Attending: Internal Medicine | Admitting: Internal Medicine

## 2016-05-01 ENCOUNTER — Other Ambulatory Visit (HOSPITAL_COMMUNITY): Payer: Self-pay | Admitting: Internal Medicine

## 2016-05-01 VITALS — BP 121/90 | HR 80 | Ht 67.0 in | Wt 188.6 lb

## 2016-05-01 DIAGNOSIS — I5022 Chronic systolic (congestive) heart failure: Secondary | ICD-10-CM | POA: Diagnosis not present

## 2016-05-01 DIAGNOSIS — Z95811 Presence of heart assist device: Secondary | ICD-10-CM | POA: Diagnosis not present

## 2016-05-01 DIAGNOSIS — I480 Paroxysmal atrial fibrillation: Secondary | ICD-10-CM | POA: Diagnosis not present

## 2016-05-01 DIAGNOSIS — I10 Essential (primary) hypertension: Secondary | ICD-10-CM

## 2016-05-01 DIAGNOSIS — I340 Nonrheumatic mitral (valve) insufficiency: Secondary | ICD-10-CM | POA: Diagnosis not present

## 2016-05-01 LAB — ECHOCARDIOGRAM COMPLETE
FS: 10 % — AB (ref 28–44)
IVS/LV PW RATIO, ED: 0.89
LA ID, A-P, ES: 57 mm
LA vol: 132 mL
LADIAMINDEX: 2.45 cm/m2
LAVOLA4C: 138 mL
LAVOLIN: 56.7 mL/m2
LDCA: 3.14 cm2
LEFT ATRIUM END SYS DIAM: 57 mm
LVOTD: 20 mm
Lateral S' vel: 6.74 cm/s
PW: 9.6 mm — AB (ref 0.6–1.1)
TAPSE: 12.7 mm

## 2016-05-01 MED ORDER — SACUBITRIL-VALSARTAN 49-51 MG PO TABS: 1.0000 | ORAL_TABLET | Freq: Two times a day (BID) | ORAL | 0 refills | Status: DC

## 2016-05-01 NOTE — Progress Notes (Signed)
  Echocardiogram 2D Echocardiogram has been performed.  [name] 05/01/2016, 1:05 PM

## 2016-05-01 NOTE — Patient Instructions (Addendum)
INCREASE your [name] - to finish your current bottle take TWO pills TWICE a day. When you pick up your new bottle take ONE pill TWICE a day.   Continue to take your other medications as prescribed.   Back to see [place] next Thursday for dressing change and next INR check   Melatonin to sleep - this is over the counter. Offered in 1 - 5 mg doses

## 2016-05-01 NOTE — Progress Notes (Signed)
Patient presents for 2 month follow up in [facility] today. Reports no problems with VAD equipment or concerns with drive line.   Vital Signs:  Doppler Pressure: 110 Automatc BP: 121/90 (108) HR:  80 *afib per device check SPO2: 100 %  Weight: 188.6 lb w/ eqt Last weight: 194.6 lb Body mass index is 29.54 kg/m.   VAD Indication: Destination Therapy - Evaluation completed at [facility] (listing meeting Monday per transplant coordinator).   VAD interrogation & Equipment Management: Speed: 9200   Flow: 3.7 Power: 4.8 w    PI: 6.5  Alarms: no clinical alarms  Events: 0 - 5  Fixed speed: 9200 Low speed limit: 8600  Primary Controller:  Replace back up battery in 16 months. Back up controller:   Replace back up battery in 16 months.  Annual Equipment Maintenance on UBC/PM was performed on Sept 2017.   I reviewed the LVAD parameters from today and compared the results to the patient's prior recorded data with Dr. Haroldine Laws. LVAD interrogation was NEGATIVE for significant power changes, NEGATIVE for clinical alarms and STABLE for PI events/speed drops. No programming changes were made and pump is functioning within specified parameters. Pt is performing daily controller and system monitor self tests along with completing weekly and monthly maintenance for LVAD equipment.  LVAD equipment check completed and is in good working order. Back-up equipment present. Charged back up battery and performed self-test on equipment.   Exit Site Care: Patient presents to clinic today for drive line exit wound care. Existing VAD dressing removed and site care performed using sterile technique. Drive line exit site cleaned with Chlora prep applicators x 2, allowed to dry, and Sorbaview dressing with bio patch re-applied. Exit site healed and incorporated, the velour is fully implanted at exit site. No redness, tenderness, drainage, foul odor or rash noted. Drive line anchor re-applied. Pt denies  fever or chills.   Return in 1 week for another dressing change per standard of care. INR to be repeated in 1-2 weeks pending results per anticoagulation protocol.   Significant Events on VAD Support:  08/2015 >> AKI requiring dialysis, Bactremia / VAD Pocket infection requiring debriedment. GIB  Device: BS Single Lead Therapies: ON VF 220 Last check:   BP & Labs:  MAP 108 - Doppler is reflecting MAP  Hgb 10.8 - No S/S of bleeding. Specifically denies melena/BRBPR or nosebleeds.   [name], RN VAD Coordinator   Office: [phone] 24/7 Emergency VAD Pager: [phone]

## 2016-05-02 NOTE — Progress Notes (Signed)
Patient ID: [name], male   DOB: 1971/02/01, 46 y.o.   MRN: [id number]   LVAD CLINIC NOTE  CHF: [name]   HPI: [name] is a 46 y/o male with systolic HF due to NICM (EF 20-25%) s/p [employer] ICD, chronic AF and gout who underwent HM II VAD implantation on 12/20/14. 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  Admitted from [facility] in [place] in 5/17 with LVAD driveline abscess. Hospitalization was complicated by septic shock, MSSA bacteremia, CDiff, and AKI. Prior to admit blood cultures from [facility] showed MSSA and C diff. Cardiac Surgery and ID consulted on admit.  He was taken to the operating room on May 9th for I& D of epigastric wound. Exposed LVAD hardware was noted. He returned to the OR May 11th for I&D and wound VAC placement. He also had a TEE that did not show any vegetation on valves or ICD wires.Plastic Surgery was consulted for complex wound with exposed hardware. On May 22nd he returned to the OR with ABRA wound closure device and application of ACell. On May 30th he returned to the OR for closure of abdominal wound. Over the course of his hospitalization he required multiple blood products due to symptomatic anemia and blood loss from abdominal wound.   ID followed closely through out his hospitalization. He was placed on broad spectrum antibiotics with adjustments made based on blood cultures. He was being treated for MSSA bacteremia, C diff, and klebseilla PNA. He has now completed Ancef, oral Vanc, and Rifampin. Urine with >100K yeast while in hospital and completed course of fluconazole.   Due to septic shock he developed AKI with after having MAPs down to the 40s. Initially requiring pressors and eventually weaned off. Nephrology was consulted and started CVVHD and later transitioned to intermittent HD. Gradually weaned of HD  He returns for LVAD follow up. At last visit amlodipine stopped due to LE edema.  Switched to [employer]. Feels "1 million percent better". Edema resolved. Back to work. Feeling great. Denies SOB/PND/Orthopnea. Seen at [facility] by Dr. Emelia Loron recently. Creatine down to 1.5, Will go to listing conference on Monday. Appetite back to normal. No bleeding or neurologic complaints.   Echo today reviewed personally. EF 20%. 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 Severe TR. Modrerate MR. Trivial AI  VAD Indication: Bridge to Transplant - Appointment completed at [facility] this past week to meet with interdisciplinary team.   VAD interrogation & Equipment Management: Speed: 9200                 Flow: 3.7 Power: 4.8 w PI: 6.5  Alarms: no clinical alarms Events: 0 - 5  Fixed speed: 9200 Low speed limit: 8600  Primary Controller: Replace back up battery in 50months. Back up controller: Replace back up battery in 49months.  Denies LVAD alarms.  Denies driveline trauma, erythema or drainage.  Denies ICD shocks.   Reports taking Coumadin as prescribed and adherence to anticoagulation based dietary restrictions.  Denies bright red blood per rectum or melena, no dark urine or hematuria.    Labs (5/17): K 5.5, creatinine 6.49 => 4.52, hgb 9, LDH 230, INR 2.38 Labs (12/26/2015): K 3.9 Creatinine 1.52   RHC 11/2015 RA = 6 RV = 23/8 PA = 25/4 (19) PCW = 6 Fick cardiac output/index = 4.8/2.6 PVR = 2.4 WU Ao sat = 98% PA sat = 49%, 51% 1. Well compensated hemodynamics with VAD support. 2. PA sat low in the setting  of marked anemia (Hgb 7.7 g/dl)     Past Medical History:  Diagnosis Date  . AICD (automatic cardioverter/defibrillator) present   . AKI (acute kidney injury) ([place])   . Atrial fibrillation -parosysmal     Rx w amiodarone  . Automatic implantable cardiac defibrillator -BSX    single chamber  . CHF (congestive heart failure) ([place])   . Chronic systolic heart failure (HCC)    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%)  . Elevated LFTs   . GI bleed -massive      11 Units 2012  . Gout   . H/O hyperthyroidism   . Hypertension   . Noncompliance    H/O  MEDICAL NONCOMPLIANCE  . 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 Polymorphic ventricular tachycardia (HCC)    RECURRENT WITH APPROPRIATE SHOCK THERAPY IN THE PAST  . RA (rheumatoid arthritis) ([place])   . Severe mitral regurgitation   . Tricuspid valve regurgitation    SEVERE  . Ventricular fibrillation ([place])    WITH APPROPRIATE SHOCK THERAPY IN THE PAST    Current Outpatient Prescriptions  Medication Sig Dispense Refill  . allopurinol (ZYLOPRIM) 100 MG tablet Take 1 tablet (100 mg total) by mouth daily. 30 tablet 6  . citalopram (CELEXA) 20 MG tablet Take 1 tablet (20 mg total) by mouth daily. 30 tablet 6  . ferrous sulfate 324 (65 Fe) MG TBEC Take 1 tablet (325 mg total) by mouth 2 (two) times daily. 30 tablet 3  . furosemide (LASIX) 80 MG tablet Take 80 mg (1 tab) three times weekly Tues Thurs Sat (Patient taking differently: 80 mg as needed for edema. Take 80 mg (1 tab) three times weekly Tues Thurs Sat) 30 tablet 3  . potassium chloride SA (K-DUR,KLOR-CON) 20 MEQ tablet Take 80 meq (4 tabs) three times weekly Tues Thurs Sat (Patient taking differently: Takes on days with lasix) 75 tablet 6  . pregabalin (LYRICA) 75 MG capsule Take 1 capsule (75 mg total) by mouth 2 (two) times daily. 60 capsule 2  . sildenafil (REVATIO) 20 MG tablet take 1 tablet by mouth three times a day (Patient taking differently: Take 20 mg by mouth 3 times daily) 90 tablet 11  . tamsulosin (FLOMAX) 0.4 MG CAPS capsule Take 1 capsule (0.4 mg total) by mouth daily. (Patient taking differently: Take 0.4 mg by mouth daily at 12 noon. ) 90 capsule 3  . warfarin (COUMADIN) 4 MG tablet Take 1 tablet (4 mg total) by mouth one time only at 6 PM. (Patient taking differently: Take 5 mg by mouth every evening. ) 30 tablet 11  . oxyCODONE (OXY IR/ROXICODONE) 5 MG immediate release tablet Take 1  tablet (5 mg total) by mouth every 3 (three) hours as needed for moderate pain or breakthrough pain. (Patient not taking: Reported on 05/01/2016) 90 tablet 0  . sacubitril-valsartan (ENTRESTO) 49-51 MG Take 1 tablet by mouth 2 (two) times daily. 60 tablet 0  . zolpidem (AMBIEN) 10 MG tablet Take 10 mg by mouth at bedtime as needed for sleep.     No current facility-administered medications for this encounter.     Phytonadione  REVIEW OF SYSTEMS: All systems negative except as listed in HPI, PMH and Problem list.   VAD interrogation & Equipment Management:  LVAD Flow 4.5 Speed 9200 Power 5.2 PI 7.4 Rare PI  Vital Signs:  BP 121/90   Pulse 80   Ht 5\' 7"  (1.702 m)  Wt 188 lb 9.6 oz (85.5 kg)   SpO2 100%   BMI 29.54 kg/m   Vital Signs:  Doppler Pressure: 110 Automatc BP: 123/76 HR: 87 SPO2: 100 % Weight: 194lb 6oz (with batteries and controller) Last weight: 197 lbs Home weights: 182 lbs BMI today: 30.44   Physical Exam: GENERAL: Well appearing, male Ambulated in the clinic without difficulty  HEENT: normal  NECK: Supple, JVP 6  No lymphadenopathy or thyromegaly appreciated.   CARDIAC:  Mechanical heart sounds with LVAD hum present.  LUNGS:  Clear to auscultation bilaterally.  ABDOMEN:  Soft, round, nontender, positive bowel sounds x4.   Dressing intact   LVAD exit site:  Site is closing but there is still some drainage.  EXTREMITIES:  Warm and dry, no cyanosis, clubbing, rash, trace  edema  NEUROLOGIC:  Alert and oriented x 4.  Gait steady.  No aphasia.  No dysarthria.  Affect pleasant.      ASSESSMENT AND PLAN:   1. Chronic systolic HF: Status post HMII LVAD implantation 12/20/14.  S/P tatus post long hospitalization as complication of driveline infection in June 2017. He is doing very well. NYHA I. He is being followed at [facility] for transplant. Had [place] on 12/18/15 with well compensated hemodynamics.  VAD parameters reviewed personally and are stable.  - Echo reviewed today  and VAD position stable. Still with significant RV dysfunction and severe TR - Seen at [facility]. I spoke with Dr. Emelia Loron and will be presented at transplant conference on Monday. 2. RV failure:  Now off milrinone.   Off digoxin with renal failure.  --echo as above. Has been off sildenafil for several days. Now refilled. Can titrate to 40 tid as needed. 3. HTN:  --Edema resolved after stopping amlodipine --MAP still up. Increase Entresto to 49/51 bid.  4. Atrial fibrillation: Mostly chronic. In AF today. Rate controlled   On coumadin for VAD. He is no longer on amiodarone.  5. Anticoagulation management: INR goal 2.0-2.5, no aspirin.    6 AKI: Seems to have Good UOP on Lasix 80 mg daily.  BMET today.    7. Driveline infection: wound completely healed.Has seen ID. Now off abx.      Glori Bickers MD 05/02/2016

## 2016-05-11 ENCOUNTER — Ambulatory Visit (HOSPITAL_COMMUNITY): Payer: Self-pay | Admitting: Pharmacist

## 2016-05-11 ENCOUNTER — Ambulatory Visit (HOSPITAL_COMMUNITY)
Admission: RE | Admit: 2016-05-11 | Discharge: 2016-05-11 | Disposition: A | Discharge status: Home / Self Care | Payer: 59 | Source: Ambulatory Visit | Attending: Cardiology | Admitting: Cardiology

## 2016-05-11 DIAGNOSIS — Z5181 Encounter for therapeutic drug level monitoring: Secondary | ICD-10-CM | POA: Diagnosis present

## 2016-05-11 DIAGNOSIS — Z7901 Long term (current) use of anticoagulants: Secondary | ICD-10-CM

## 2016-05-11 DIAGNOSIS — I5023 Acute on chronic systolic (congestive) heart failure: Secondary | ICD-10-CM | POA: Diagnosis not present

## 2016-05-11 DIAGNOSIS — Z95811 Presence of heart assist device: Secondary | ICD-10-CM | POA: Diagnosis not present

## 2016-05-11 LAB — PROTIME-INR
INR: 2.4
Prothrombin Time: 26.6 seconds — ABNORMAL HIGH (ref 11.4–15.2)

## 2016-05-11 NOTE — Progress Notes (Signed)
Patient presents to clinic today for drive line exit wound care. Existing VAD dressing removed and site care performed using sterile technique. Drive line exit site cleaned with Chlora prep applicators x 2, allowed to dry, and Sorbaview dressing with bio patch re-applied. Exit site healed and incorporated, the velour is fully implanted at exit site. No redness, tenderness, drainage, foul odor or rash noted. Drive line anchor re-applied. Pt denies fever or chills.   Return in 1 week for another dressing change per standard of care. INR to be repeated in next week pending results per anticoagulation protocol.   [name], RN VAD Coordinator   Office: [phone] 24/7 Emergency VAD Pager: [phone]

## 2016-05-13 ENCOUNTER — Other Ambulatory Visit (HOSPITAL_COMMUNITY): Payer: Self-pay | Admitting: Internal Medicine

## 2016-05-13 ENCOUNTER — Other Ambulatory Visit: Payer: Self-pay

## 2016-05-18 ENCOUNTER — Other Ambulatory Visit: Payer: Self-pay

## 2016-05-19 ENCOUNTER — Ambulatory Visit (HOSPITAL_COMMUNITY): Payer: Self-pay | Admitting: Pharmacist

## 2016-05-19 ENCOUNTER — Ambulatory Visit (HOSPITAL_COMMUNITY)
Admission: RE | Admit: 2016-05-19 | Discharge: 2016-05-19 | Disposition: A | Discharge status: Home / Self Care | Payer: 59 | Source: Ambulatory Visit | Attending: Cardiology | Admitting: Cardiology

## 2016-05-19 DIAGNOSIS — I482 Chronic atrial fibrillation, unspecified: Secondary | ICD-10-CM

## 2016-05-19 DIAGNOSIS — M1000 Idiopathic gout, unspecified site: Secondary | ICD-10-CM

## 2016-05-19 LAB — PROTIME-INR
INR: 1.93
PROTHROMBIN TIME: 22.3 s — AB (ref 11.4–15.2)

## 2016-05-19 MED ORDER — ALLOPURINOL 100 MG PO TABS: 100.0000 mg | ORAL_TABLET | Freq: Every day | ORAL | 6 refills | Status: DC

## 2016-05-19 NOTE — Addendum Note (Signed)
Encounter addended by: [place] Callas, RN on: 05/19/2016 12:51 PM<BR>    Actions taken: Visit diagnoses modified, Order list changed, Diagnosis association updated, Sign clinical note, Charge Capture section accepted

## 2016-05-19 NOTE — Progress Notes (Signed)
Patient presents to clinic today for drive line exit wound care. Existing VAD dressing removed and site care performed using sterile technique. Drive line exit site cleaned with Chlora prep applicators x 2, allowed to dry, and Sorbaview dressing with bio patch re-applied. Exit site healed and incorporated, the velour is fully implanted at exit site. No redness, tenderness, drainage, foul odor or rash noted. Drive line anchor re-applied. Pt denies fever or chills.   Return in 1 week for another dressing change per standard of care. INR to be repeated in 1-2 weeks pending results per anticoagulation protocol.   [name], RN VAD Coordinator   Office: [phone] 24/7 Emergency VAD Pager: [phone]

## 2016-05-28 ENCOUNTER — Other Ambulatory Visit (HOSPITAL_COMMUNITY): Payer: Self-pay | Admitting: Infectious Diseases

## 2016-05-28 ENCOUNTER — Ambulatory Visit (HOSPITAL_COMMUNITY)
Admission: RE | Admit: 2016-05-28 | Discharge: 2016-05-28 | Disposition: A | Discharge status: Home / Self Care | Payer: 59 | Source: Ambulatory Visit | Attending: Cardiology | Admitting: Cardiology

## 2016-05-28 ENCOUNTER — Ambulatory Visit (HOSPITAL_COMMUNITY): Payer: Self-pay | Admitting: Pharmacist

## 2016-05-28 DIAGNOSIS — Z4801 Encounter for change or removal of surgical wound dressing: Secondary | ICD-10-CM | POA: Insufficient documentation

## 2016-05-28 DIAGNOSIS — Z5181 Encounter for therapeutic drug level monitoring: Secondary | ICD-10-CM

## 2016-05-28 DIAGNOSIS — Z7901 Long term (current) use of anticoagulants: Secondary | ICD-10-CM

## 2016-05-28 LAB — PROTIME-INR
INR: 2.26
PROTHROMBIN TIME: 25.3 s — AB (ref 11.4–15.2)

## 2016-05-28 NOTE — Progress Notes (Signed)
Patient presents to clinic today for drive line exit wound care. Existing VAD dressing removed and site care performed using sterile technique. Drive line exit site cleaned with Chlora prep applicators x 2, allowed to dry, and Sorbaview dressing with bio patch re-applied. Exit site healed and incorporated, the velour is fully implanted at exit site. No redness, tenderness, drainage, foul odor or rash noted. Drive line anchor re-applied. Pt denies fever or chills.   Return in 1 week for another dressing change per standard of care. INR to be repeated in 1-2 weeks pending results per anticoagulation protocol.   [name], RN VAD Coordinator   Office: [phone] 24/7 Emergency VAD Pager: [phone]

## 2016-05-28 NOTE — Addendum Note (Signed)
Encounter addended by: [place] Callas, RN on: 05/28/2016 12:42 PM<BR>    Actions taken: Sign clinical note, Charge Capture section accepted

## 2016-06-02 ENCOUNTER — Other Ambulatory Visit (HOSPITAL_COMMUNITY): Payer: Self-pay | Admitting: Internal Medicine

## 2016-06-02 DIAGNOSIS — E611 Iron deficiency: Secondary | ICD-10-CM

## 2016-06-04 ENCOUNTER — Other Ambulatory Visit (HOSPITAL_COMMUNITY): Payer: 59

## 2016-06-08 ENCOUNTER — Ambulatory Visit (HOSPITAL_COMMUNITY)
Admission: RE | Admit: 2016-06-08 | Discharge: 2016-06-08 | Disposition: A | Discharge status: Home / Self Care | Payer: 59 | Source: Ambulatory Visit | Attending: Cardiology | Admitting: Cardiology

## 2016-06-08 ENCOUNTER — Other Ambulatory Visit (HOSPITAL_COMMUNITY): Payer: Self-pay | Admitting: Pharmacist

## 2016-06-08 DIAGNOSIS — Z4801 Encounter for change or removal of surgical wound dressing: Secondary | ICD-10-CM | POA: Insufficient documentation

## 2016-06-08 DIAGNOSIS — E611 Iron deficiency: Secondary | ICD-10-CM

## 2016-06-08 DIAGNOSIS — Z95811 Presence of heart assist device: Secondary | ICD-10-CM

## 2016-06-08 MED ORDER — FERROUS SULFATE 324 (65 FE) MG PO TBEC: 1.0000 | DELAYED_RELEASE_TABLET | Freq: Two times a day (BID) | ORAL | 5 refills | Status: DC

## 2016-06-11 NOTE — Progress Notes (Signed)
Patient presents to clinic today for drive line exit wound care. Existing VAD dressing removed and site care performed using sterile technique. Drive line exit site cleaned with Chlora prep applicators x 2, allowed to dry, and Sorbaview dressing with bio patch re-applied. Exit site healed and incorporated, the velour is fully implanted at exit site. No redness, tenderness, drainage, foul odor or rash noted. Drive line anchor re-applied. Pt denies fever or chills. INR obtained today.   Return in 1 week for another dressing change per standard of care. INR to be repeated in 1-2 weeks pending results per anticoagulation protocol.   [name], RN VAD Coordinator   Office: [phone] 24/7 Emergency VAD Pager: [phone]

## 2016-06-15 ENCOUNTER — Ambulatory Visit (HOSPITAL_COMMUNITY)
Admission: RE | Admit: 2016-06-15 | Discharge: 2016-06-15 | Disposition: A | Discharge status: Home / Self Care | Payer: 59 | Source: Ambulatory Visit | Attending: Internal Medicine | Admitting: Internal Medicine

## 2016-06-15 DIAGNOSIS — Z4801 Encounter for change or removal of surgical wound dressing: Secondary | ICD-10-CM | POA: Diagnosis not present

## 2016-06-15 NOTE — Progress Notes (Signed)
Patient presents to clinic today for drive line exit wound care. Existing VAD dressing removed and site care performed using sterile technique. Drive line exit site cleaned with Chlora prep applicators x 2, allowed to dry, and Sorbaview dressing with bio patch re-applied. Exit site healed and incorporated, the velour is fully implanted at exit site. No redness, tenderness, drainage, foul odor or rash noted. Drive line anchor re-applied. Pt denies fever or chills.   Return in 1 week for another dressing change per standard of care. I  Balinda Quails RN, VAD Coordinator 24/7 pager [phone]

## 2016-06-22 ENCOUNTER — Ambulatory Visit (HOSPITAL_COMMUNITY): Payer: Self-pay | Admitting: Pharmacist

## 2016-06-22 ENCOUNTER — Ambulatory Visit (HOSPITAL_COMMUNITY): Payer: Self-pay | Admitting: Unknown Physician Specialty

## 2016-06-22 ENCOUNTER — Ambulatory Visit (HOSPITAL_COMMUNITY)
Admission: RE | Admit: 2016-06-22 | Discharge: 2016-06-22 | Disposition: A | Discharge status: Home / Self Care | Payer: 59 | Source: Ambulatory Visit | Attending: Cardiology | Admitting: Cardiology

## 2016-06-22 DIAGNOSIS — Z4801 Encounter for change or removal of surgical wound dressing: Secondary | ICD-10-CM | POA: Insufficient documentation

## 2016-06-22 DIAGNOSIS — Z95811 Presence of heart assist device: Secondary | ICD-10-CM

## 2016-06-22 LAB — PROTIME-INR
INR: 2.19
Prothrombin Time: 24.7 seconds — ABNORMAL HIGH (ref 11.4–15.2)

## 2016-06-22 NOTE — Progress Notes (Signed)
Patient presents to clinic today for drive line exit wound care. Existing VAD dressing removed and site care performed using sterile technique. Drive line exit site cleaned with Chlora prep applicators x 2, allowed to dry, and Sorbaview dressing with bio patch re-applied. Exit site healed and incorporated, the velour is fully implanted at exit site. No redness, tenderness, drainage, foul odor or rash noted. Drive line anchor re-applied. Pt denies fever or chills.   Return in 1 week for another dressing change per standard of care.   Tanda Rockers RN, BSN  VAD Coordinator 24/7 pager [phone]

## 2016-06-29 ENCOUNTER — Other Ambulatory Visit (HOSPITAL_COMMUNITY): Payer: 59

## 2016-07-01 ENCOUNTER — Telehealth (HOSPITAL_COMMUNITY): Payer: Self-pay | Admitting: Pharmacist

## 2016-07-01 ENCOUNTER — Ambulatory Visit (HOSPITAL_COMMUNITY): Payer: Self-pay | Admitting: Pharmacist

## 2016-07-01 ENCOUNTER — Ambulatory Visit (HOSPITAL_COMMUNITY)
Admission: RE | Admit: 2016-07-01 | Discharge: 2016-07-01 | Disposition: A | Discharge status: Home / Self Care | Payer: 59 | Source: Ambulatory Visit | Attending: Internal Medicine | Admitting: Internal Medicine

## 2016-07-01 VITALS — BP 112/72 | HR 89 | Ht 67.0 in | Wt 197.6 lb

## 2016-07-01 DIAGNOSIS — I482 Chronic atrial fibrillation: Secondary | ICD-10-CM | POA: Diagnosis not present

## 2016-07-01 DIAGNOSIS — Z5189 Encounter for other specified aftercare: Secondary | ICD-10-CM | POA: Diagnosis present

## 2016-07-01 DIAGNOSIS — I132 Hypertensive heart and chronic kidney disease with heart failure and with stage 5 chronic kidney disease, or end stage renal disease: Secondary | ICD-10-CM | POA: Insufficient documentation

## 2016-07-01 DIAGNOSIS — Z79899 Other long term (current) drug therapy: Secondary | ICD-10-CM | POA: Diagnosis not present

## 2016-07-01 DIAGNOSIS — I5022 Chronic systolic (congestive) heart failure: Secondary | ICD-10-CM | POA: Insufficient documentation

## 2016-07-01 DIAGNOSIS — D508 Other iron deficiency anemias: Secondary | ICD-10-CM

## 2016-07-01 DIAGNOSIS — Z95811 Presence of heart assist device: Secondary | ICD-10-CM

## 2016-07-01 DIAGNOSIS — Z7901 Long term (current) use of anticoagulants: Secondary | ICD-10-CM

## 2016-07-01 DIAGNOSIS — D649 Anemia, unspecified: Secondary | ICD-10-CM | POA: Insufficient documentation

## 2016-07-01 DIAGNOSIS — I5023 Acute on chronic systolic (congestive) heart failure: Secondary | ICD-10-CM

## 2016-07-01 LAB — PROTIME-INR
INR: 1.94
Prothrombin Time: 22.5 seconds — ABNORMAL HIGH (ref 11.4–15.2)

## 2016-07-01 LAB — BASIC METABOLIC PANEL
ANION GAP: 7 (ref 5–15)
BUN: 15 mg/dL (ref 6–20)
CO2: 22 mmol/L (ref 22–32)
Calcium: 8.6 mg/dL — ABNORMAL LOW (ref 8.9–10.3)
Chloride: 111 mmol/L (ref 101–111)
Creatinine, Ser: 1.64 mg/dL — ABNORMAL HIGH (ref 0.61–1.24)
GFR calc Af Amer: 56 mL/min — ABNORMAL LOW (ref >60)
GFR calc non Af Amer: 48 mL/min — ABNORMAL LOW (ref >60)
Glucose, Bld: 88 mg/dL (ref 65–99)
Potassium: 4.4 mmol/L (ref 3.5–5.1)
SODIUM: 140 mmol/L (ref 135–145)

## 2016-07-01 LAB — CBC
HEMATOCRIT: 26.1 % — AB (ref 39.0–52.0)
Hemoglobin: 7.7 g/dL — ABNORMAL LOW (ref 13.0–17.0)
MCH: 21.2 pg — ABNORMAL LOW (ref 26.0–34.0)
MCHC: 29.5 g/dL — ABNORMAL LOW (ref 30.0–36.0)
MCV: 71.9 fL — AB (ref 78.0–100.0)
Platelets: 138 10*3/uL — ABNORMAL LOW (ref 150–400)
RBC: 3.63 MIL/uL — AB (ref 4.22–5.81)
RDW: 22.8 % — ABNORMAL HIGH (ref 11.5–15.5)
WBC: 3.7 10*3/uL — AB (ref 4.0–10.5)

## 2016-07-01 LAB — PREALBUMIN: PREALBUMIN: 13.2 mg/dL — AB (ref 18–38)

## 2016-07-01 LAB — LACTATE DEHYDROGENASE: LDH: 225 U/L — AB (ref 98–192)

## 2016-07-01 MED ORDER — SACUBITRIL-VALSARTAN 97-103 MG PO TABS: 1.0000 | ORAL_TABLET | Freq: Two times a day (BID) | ORAL | 11 refills | Status: DC

## 2016-07-01 MED ORDER — SACUBITRIL-VALSARTAN 49-51 MG PO TABS
1.0000 | ORAL_TABLET | Freq: Two times a day (BID) | ORAL | 11 refills | Status: DC
Start: 2016-07-01 — End: 2016-07-01

## 2016-07-01 NOTE — Telephone Encounter (Signed)
Mr. Ketchum has been having trouble getting his Entresto copay card activated. I have called today and activated the card for him. Called and relayed info to [facility] on [address].   ID: [id number] BIN: [id number] PCN: [id number] GRP: [id number]  Padme Arriaga K. Velva Harman, PharmD, BCPS, CPP Clinical Pharmacist Pager: [phone] Phone: [phone] 07/01/2016 11:59 AM

## 2016-07-01 NOTE — Progress Notes (Addendum)
Patient presents for 2 month follow up in [facility] today. Reports no problems with VAD equipment or concerns with drive line.   Vital Signs:  Doppler Pressure: 102 Automatc BP: 112/72 (90) HR:  89 SPO2: 100 %  Weight: 197.6 lb w/ eqt Last weight: 188.6 lb   VAD Indication: Destination Therapy - Evaluation completed at [facility]. Pt is having teeth extracted with dental appointment on 4/13.   VAD interrogation & Equipment Management: Speed: 9200   Flow: 4.2 Power: 4.9 w    PI: 7.2  Alarms: few low voltage alarms Events: 3-8 PI daily  Fixed speed: 9200 Low speed limit: 8600  Primary Controller:  Replace back up battery in 14 months. Back up controller:   Replace back up battery in 14 months.  Annual Equipment Maintenance on UBC/PM was performed on Sept 2017.   I reviewed the LVAD parameters from today and compared the results to the patient's prior recorded data with Dr. Haroldine Laws. LVAD interrogation was NEGATIVE for significant power changes, NEGATIVE for clinical alarms and STABLE for PI events/speed drops. No programming changes were made and pump is functioning within specified parameters. Pt is performing daily controller and system monitor self tests along with completing weekly and monthly maintenance for LVAD equipment.  LVAD equipment check completed and is in good working order. Back-up equipment present. Charged back up battery and performed self-test on equipment.   Exit Site Care: Patient presents to clinic today for drive line exit wound care. Existing VAD dressing removed and site care performed using sterile technique. Drive line exit site cleaned with Chlora prep applicators x 2, allowed to dry, and Sorbaview dressing with bio patch re-applied. Exit site healed and incorporated, the velour is fully implanted at exit site. No redness, tenderness, drainage, foul odor or rash noted. Drive line anchor re-applied. Pt denies fever or chills.   Significant Events  on VAD Support:  08/2015 >> AKI requiring dialysis, Bactremia / VAD Pocket infection requiring debriedment. GIB  Device: BS Single Lead Therapies: ON VF 220 Last check:   BP & Labs:  MAP 108 - Doppler is reflecting MAP  Hgb 7.7 - No S/S of bleeding. Specifically denies melena/BRBPR or nosebleeds. We will recheck hgb Monday 3-19.  1.5 year Intermacs follow up completed including:  Quality of Life, KCCQ-12, and Neurocognitive trail making.   Pt completed 1340 feet during 6 minute walk.   Pt instructions: 1. Take extra Lasix tonight, 2 tomorrow and 1 everyday until we see you back on Monday. 2. Increase Entresto to 97/103 per Dr. Haroldine Laws.   3. Return to clinic Monday for weight, dressing change and labs-CBC and INR.  Tanda Rockers, RN, BSN VAD Coordinator   Office: [phone] 24/7 Emergency VAD Pager: [phone]

## 2016-07-02 NOTE — Telephone Encounter (Addendum)
[facility] called stating that the copay card was not working. I have called them again and asked them to re-run and it is now going through for $10.   Doroteo Bradford K. Velva Harman, PharmD, BCPS, CPP Clinical Pharmacist Pager: [phone] Phone: [phone] 07/02/2016 12:49 PM   Addendum: Lennette Bihari faxed stating his plan doesn't require a PA for [facility]

## 2016-07-04 NOTE — Progress Notes (Signed)
Patient ID: [name], male   DOB: 1970/07/27, 46 y.o.   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  Admitted from [facility] in [place] in 5/17 with LVAD driveline abscess. Hospitalization was complicated by septic shock, MSSA bacteremia, CDiff, and AKI.He underwent surgical I&D of wound with exposed LVAD hardware. TEE that did not show any vegetation on valves or ICD wires. Over the course of his hospitalization he required multiple blood products due to symptomatic anemia and blood loss from abdominal wound. Treated with IV abx for multiple weeks. Developed AKI/ESRD and required HD for several months before recovering.   He returns for LVAD follow up. Has been following with [facility] for transplant listing.Overall feels very good. Had his bday over the weekend and weight up 7-8 pounds.+ edema. Denies SOB, orthopnea or PND. Took 1 dose of torsemide and starting to diurese. Continues to work [employer]. No bleeding or neuro issues. Compliant with all meds. VAD parameters stable.   Echo 1/18. EF 20%. 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 Severe TR. Modrerate MR. Trivial AI  VAD Indication: Destination Therapy- Evaluation completed at [facility]. Emelia Loron)  VAD interrogation & Equipment Management: Speed: 9200                 Flow: 4.2 Power: 4.9 w PI: 7.2  Alarms: few low voltage alarms Events: 3-8 PI daily  Fixed speed: 9200 Low speed limit: 8600  Primary Controller: Replace back up battery in 40months. Back up controller: Replace back up battery in 76months.  Denies LVAD alarms.  Denies driveline trauma, erythema or drainage.  Denies ICD shocks.   Reports taking Coumadin as prescribed and adherence to anticoagulation based dietary  restrictions.  Denies bright red blood per rectum or melena, no dark urine or hematuria.    Labs (5/17): K 5.5, creatinine 6.49 => 4.52, hgb 9, LDH 230, INR 2.38 Labs (12/26/2015): K 3.9 Creatinine 1.52   RHC 11/2015 RA = 6 RV = 23/8 PA = 25/4 (19) PCW = 6 Fick cardiac output/index = 4.8/2.6 PVR = 2.4 WU Ao sat = 98% PA sat = 49%, 51% 1. Well compensated hemodynamics with VAD support. 2. PA sat low in the setting of marked anemia (Hgb 7.7 g/dl)     Past Medical History:  Diagnosis Date  . AICD (automatic cardioverter/defibrillator) present   . AKI (acute kidney injury) ([place])   . Atrial fibrillation -parosysmal     Rx w amiodarone  . Automatic implantable cardiac defibrillator -BSX    single chamber  . CHF (congestive heart failure) ([place])   . Chronic systolic heart failure (HCC)    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%)  . Elevated LFTs   . GI bleed -massive    11 Units 2012  . Gout   . H/O hyperthyroidism   . Hypertension   . Noncompliance    H/O  MEDICAL NONCOMPLIANCE  . 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 Polymorphic ventricular tachycardia (HCC)    RECURRENT WITH APPROPRIATE SHOCK THERAPY IN THE PAST  . RA (rheumatoid arthritis) ([place])   . Severe mitral regurgitation   . Tricuspid valve regurgitation    SEVERE  . Ventricular fibrillation ([place])  WITH APPROPRIATE SHOCK THERAPY IN THE PAST    Current Outpatient Prescriptions  Medication Sig Dispense Refill  . allopurinol (ZYLOPRIM) 100 MG tablet Take 1 tablet (100 mg total) by mouth daily. 30 tablet 6  . citalopram (CELEXA) 20 MG tablet take 1 tablet by mouth once daily 30 tablet 6  . ferrous sulfate 324 (65 Fe) MG TBEC Take 1 tablet (325 mg total) by mouth 2 (two) times daily. 60 tablet 5  . pregabalin (LYRICA) 75 MG capsule Take 1 capsule (75 mg total) by mouth 2 (two) times daily. 60 capsule 2  . amoxicillin (AMOXIL) 500 MG capsule Take 2,000 mg by mouth  as directed. 1 hour prior to dental appointment  0  . furosemide (LASIX) 80 MG tablet Take 80 mg by mouth as needed (edema and weight gain).    . potassium chloride SA (K-DUR,KLOR-CON) 20 MEQ tablet Take 20 mEq by mouth as needed (when take furosemide).    . sacubitril-valsartan (ENTRESTO) 97-103 MG Take 1 tablet by mouth 2 (two) times daily. 60 tablet 11  . sildenafil (REVATIO) 20 MG tablet Take 20 mg by mouth 3 (three) times daily.    . tamsulosin (FLOMAX) 0.4 MG CAPS capsule Take 0.4 mg by mouth daily.    Marland Kitchen warfarin (COUMADIN) 4 MG tablet Take 4-6 mg by mouth daily. Take 4 mg (1 tablet) daily except 6 mg (1 and 1/2 tablet) on Tuesday    . zolpidem (AMBIEN) 10 MG tablet Take 10 mg by mouth at bedtime as needed for sleep.     No current facility-administered medications for this encounter.     Phytonadione  REVIEW OF SYSTEMS: All systems negative except as listed in HPI, PMH and Problem list.   VAD interrogation & Equipment Management:  LVAD Flow 4.5 Speed 9200 Power 5.2 PI 7.4 Rare PI  Vital Signs:  BP 112/72 Comment: map-90  Pulse 89   Ht 5\' 7"  (1.702 m)   Wt 197 lb 9.6 oz (89.6 kg)   SpO2 100%   BMI 30.95 kg/m   Vital Signs:  Doppler Pressure:102 Automatc BP: 112/72 (90) HR: 89 SPO2: 100 %  Weight: 197.6 lb w/ eqt Last weight: 188.6 lb   Physical Exam: GENERAL: Well appearing, male Ambulated in the clinic without difficulty  HEENT: normal. anicteric NECK: Supple, JVP to jaw  No lymphadenopathy or thyromegaly appreciated.   CARDIAC:  Mechanical heart sounds with LVAD hum present.   LUNGS:  Clear to auscultation bilaterally. No wheeze ABDOMEN:  Soft, round, nontender, good bowel sounds x4. + distended  Dressing intact   LVAD exit site:  Well healed EXTREMITIES:  Warm and dry, no cyanosis, clubbing, rash. 2+ edema  NEUROLOGIC:  Alert and oriented x 4.  Moves all 4 without dififculty Affect pleasant.      ASSESSMENT AND PLAN:   1. Acute on chronic systolic HF  due to NICM:  --Status post HMII LVAD implantation 12/20/14.   --Status post long hospitalization as complication of driveline infection in June 2017.  --He is doing well. NYHA I. He is being followed at [facility] for transplant. Had [place] on 12/18/15 with well compensated hemodynamics.   --Volume status up today in the setting of dietary noncompliance (for his bday). Will have him take lasix bid for 2 days then daily until we see him again next week  -- MAP up in setting of volume overload. Will increase Entresto --VAD parameters reviewed personally and are stable.  - Echo 1/18. Still with significant RV  dysfunction and severe TR 2. RV failure:  Now off milrinone.   Off digoxin with renal failure.  --echo as above. Doing well clinically. Continue sildenafil 3. HTN:  --MAP still up. Increase Entresto to 97/10s bid.  4. Atrial fibrillation: Mostly chronic.  --In AF today. Rate controlled   On coumadin for VAD. He is no longer on amiodarone.  5. Anticoagulation management: INR goal 2.0-2.5, no aspirin. INR 1.94 today. Discussed with PharmD  6  CKD --creatinine stable at 1.6 7. Driveline infection: wound completely healed. Has seen ID. Now off abx.  8. Anemia --Hgb down to 7.7 today. No obvious bleeding. LDH low at 225. MCV low. Will recheck next week. May need RBCs though would like to limit with transplant listing. MCV low at 72. Check iron stores. Consider Feraheme.     Glori Bickers MD 07/04/2016

## 2016-07-06 ENCOUNTER — Ambulatory Visit (HOSPITAL_COMMUNITY)
Admission: RE | Admit: 2016-07-06 | Discharge: 2016-07-06 | Disposition: A | Discharge status: Home / Self Care | Payer: 59 | Source: Ambulatory Visit | Attending: Cardiology | Admitting: Cardiology

## 2016-07-06 ENCOUNTER — Other Ambulatory Visit (HOSPITAL_COMMUNITY): Payer: 59

## 2016-07-06 ENCOUNTER — Ambulatory Visit (HOSPITAL_COMMUNITY): Payer: Self-pay | Admitting: Pharmacist

## 2016-07-06 ENCOUNTER — Other Ambulatory Visit (HOSPITAL_COMMUNITY): Payer: Self-pay | Admitting: Unknown Physician Specialty

## 2016-07-06 DIAGNOSIS — Z95811 Presence of heart assist device: Secondary | ICD-10-CM

## 2016-07-06 DIAGNOSIS — Z7901 Long term (current) use of anticoagulants: Secondary | ICD-10-CM

## 2016-07-06 LAB — CBC
HCT: 32.5 % — ABNORMAL LOW (ref 39.0–52.0)
Hemoglobin: 9.5 g/dL — ABNORMAL LOW (ref 13.0–17.0)
MCH: 21.4 pg — AB (ref 26.0–34.0)
MCHC: 29.2 g/dL — ABNORMAL LOW (ref 30.0–36.0)
MCV: 73.4 fL — AB (ref 78.0–100.0)
Platelets: 171 10*3/uL (ref 150–400)
RBC: 4.43 MIL/uL (ref 4.22–5.81)
RDW: 22.6 % — AB (ref 11.5–15.5)
WBC: 4.5 10*3/uL (ref 4.0–10.5)

## 2016-07-06 LAB — TYPE AND SCREEN
ABO/RH(D): O POS
Antibody Screen: NEGATIVE

## 2016-07-06 LAB — PROTIME-INR
INR: 1.99
Prothrombin Time: 22.9 seconds — ABNORMAL HIGH (ref 11.4–15.2)

## 2016-07-06 NOTE — Addendum Note (Signed)
Encounter addended by: Christinia Gully, RN on: 07/06/2016  3:17 PM<BR>    Actions taken: Sign clinical note

## 2016-07-06 NOTE — Addendum Note (Signed)
Encounter addended by: Christinia Gully, RN on: 07/06/2016  3:41 PM<BR>    Actions taken: Sign clinical note

## 2016-07-06 NOTE — Progress Notes (Addendum)
Patient presents to clinic today for drive line exit wound care. Existing VAD dressing removed and site care performed using sterile technique. Drive line exit site cleaned with Chlora prep applicators x 2, allowed to dry, and Sorbaview dressing with bio patch re-applied. Exit site healed and incorporated, the velour is fully implanted at exit site. No redness, tenderness, drainage, foul odor or rash noted. Drive line anchor re-applied. Pt denies fever or chills.   Pts weight today is 196 lbs. Pt was only down 1 lb from Thursday. Pt was instructed to weigh himself everyday and call us if his weight does not level out.  Return in 1 week for another dressing change per standard of care.   Tanda Rockers RN, BSN  VAD Coordinator 24/7 pager [phone]

## 2016-07-13 ENCOUNTER — Ambulatory Visit (HOSPITAL_COMMUNITY)
Admission: RE | Admit: 2016-07-13 | Discharge: 2016-07-13 | Disposition: A | Discharge status: Home / Self Care | Payer: 59 | Source: Ambulatory Visit | Attending: Internal Medicine | Admitting: Internal Medicine

## 2016-07-13 ENCOUNTER — Ambulatory Visit (HOSPITAL_COMMUNITY): Payer: Self-pay | Admitting: Pharmacist

## 2016-07-13 ENCOUNTER — Other Ambulatory Visit (HOSPITAL_COMMUNITY): Payer: Self-pay | Admitting: Infectious Diseases

## 2016-07-13 DIAGNOSIS — Z95811 Presence of heart assist device: Secondary | ICD-10-CM

## 2016-07-13 DIAGNOSIS — Z5181 Encounter for therapeutic drug level monitoring: Secondary | ICD-10-CM

## 2016-07-13 DIAGNOSIS — Z7901 Long term (current) use of anticoagulants: Secondary | ICD-10-CM

## 2016-07-13 LAB — PROTIME-INR
INR: 1.83
PROTHROMBIN TIME: 21.4 s — AB (ref 11.4–15.2)

## 2016-07-13 NOTE — Progress Notes (Signed)
Patient presents to clinic today for drive line exit wound care. Existing VAD dressing removed and site care performed using sterile technique. Drive line exit site cleaned with Chlora prep applicators x 2, allowed to dry, and Sorbaview dressing with bio patch re-applied. Exit site healed and incorporated, the velour is fully implanted at exit site. No redness, tenderness, drainage, foul odor or rash noted. Drive line anchor re-applied. Pt denies fever or chills.   Return in 1 week for another dressing change per standard of care. INR to be repeated in 1-2 weeks pending results per anticoagulation protocol.   Balinda Quails RN, VAD Coordinator 24/7 pager [phone]

## 2016-07-20 ENCOUNTER — Ambulatory Visit (HOSPITAL_COMMUNITY): Payer: Self-pay | Admitting: Pharmacist

## 2016-07-20 ENCOUNTER — Ambulatory Visit (HOSPITAL_COMMUNITY)
Admission: RE | Admit: 2016-07-20 | Discharge: 2016-07-20 | Disposition: A | Discharge status: Home / Self Care | Payer: 59 | Source: Ambulatory Visit | Attending: Internal Medicine | Admitting: Internal Medicine

## 2016-07-20 DIAGNOSIS — Z95811 Presence of heart assist device: Secondary | ICD-10-CM | POA: Diagnosis present

## 2016-07-20 LAB — PROTIME-INR
INR: 1.92
PROTHROMBIN TIME: 22.2 s — AB (ref 11.4–15.2)

## 2016-07-20 NOTE — Progress Notes (Signed)
Patient presents to clinic today for drive line exit wound care. Existing VAD dressing removed and site care performed using sterile technique. Drive line exit site cleaned with Chlora prep applicators x 2, allowed to dry, and Sorbaview dressing with bio patch re-applied. Exit site healed and incorporated, the velour is fully implanted at exit site. No redness, tenderness, drainage, foul odor or rash noted. Drive line anchor re-applied. Pt denies fever or chills.   Return in 1 week for another dressing change per standard of care. INR to be repeated in 1-2 weeks pending results per anticoagulation protocol.   RTC next week for dressing change and in two weeks for VAD MD visit.   Balinda Quails RN, VAD Coordinator 24/7 pager [phone]

## 2016-07-21 ENCOUNTER — Other Ambulatory Visit (HOSPITAL_COMMUNITY): Payer: Self-pay | Admitting: Cardiology

## 2016-07-21 DIAGNOSIS — I5023 Acute on chronic systolic (congestive) heart failure: Secondary | ICD-10-CM

## 2016-07-21 DIAGNOSIS — I482 Chronic atrial fibrillation, unspecified: Secondary | ICD-10-CM

## 2016-07-22 ENCOUNTER — Telehealth (HOSPITAL_COMMUNITY): Payer: Self-pay | Admitting: *Deleted

## 2016-07-22 DIAGNOSIS — I5023 Acute on chronic systolic (congestive) heart failure: Secondary | ICD-10-CM

## 2016-07-22 DIAGNOSIS — I482 Chronic atrial fibrillation, unspecified: Secondary | ICD-10-CM

## 2016-07-22 NOTE — Telephone Encounter (Signed)
Furosemide refill sent in for today. Patient notified

## 2016-07-24 ENCOUNTER — Other Ambulatory Visit (HOSPITAL_COMMUNITY): Payer: Self-pay | Admitting: Pharmacist

## 2016-07-24 DIAGNOSIS — I482 Chronic atrial fibrillation, unspecified: Secondary | ICD-10-CM

## 2016-07-24 DIAGNOSIS — I5023 Acute on chronic systolic (congestive) heart failure: Secondary | ICD-10-CM

## 2016-07-24 MED ORDER — FUROSEMIDE 40 MG PO TABS: 40.0000 mg | ORAL_TABLET | Freq: Every day | ORAL | 6 refills | Status: DC | PRN

## 2016-07-27 ENCOUNTER — Other Ambulatory Visit (HOSPITAL_COMMUNITY): Payer: 59

## 2016-07-27 ENCOUNTER — Other Ambulatory Visit (HOSPITAL_COMMUNITY): Payer: Self-pay | Admitting: Unknown Physician Specialty

## 2016-07-27 DIAGNOSIS — Z95811 Presence of heart assist device: Secondary | ICD-10-CM

## 2016-07-28 ENCOUNTER — Ambulatory Visit (HOSPITAL_COMMUNITY)
Admission: RE | Admit: 2016-07-28 | Discharge: 2016-07-28 | Disposition: A | Discharge status: Home / Self Care | Payer: 59 | Source: Ambulatory Visit | Attending: Cardiology | Admitting: Cardiology

## 2016-07-28 ENCOUNTER — Ambulatory Visit (HOSPITAL_COMMUNITY): Payer: Self-pay | Admitting: Pharmacist

## 2016-07-28 DIAGNOSIS — Z95811 Presence of heart assist device: Secondary | ICD-10-CM | POA: Diagnosis present

## 2016-07-28 LAB — PROTIME-INR
INR: 2.22
Prothrombin Time: 25 seconds — ABNORMAL HIGH (ref 11.4–15.2)

## 2016-07-28 MED ORDER — AMOXICILLIN 500 MG PO CAPS: 2000.0000 mg | ORAL_CAPSULE | ORAL | 1 refills | Status: DC

## 2016-07-28 NOTE — Progress Notes (Signed)
Patient presents to clinic today for drive line exit wound care. Existing VAD dressing removed and site care performed using sterile technique. Drive line exit site cleaned with Chlora prep applicators x 2, allowed to dry, and Sorbaview dressing with bio patch re-applied. Exit site healed and incorporated, the velour is fully implanted at exit site. No redness, tenderness, drainage, foul odor or rash noted. Drive line anchor re-applied. Pt denies fever or chills.   Return in 1 week for another dressing change per standard of care. INR to be repeated in 1-2 weeks pending results per anticoagulation protocol.    Balinda Quails RN, VAD Coordinator 24/7 pager [phone]

## 2016-07-28 NOTE — Addendum Note (Signed)
Encounter addended by: Candy Sledge, RN on: 07/28/2016  1:24 PM<BR>    Actions taken: Sign clinical note

## 2016-07-31 ENCOUNTER — Encounter: Payer: Self-pay | Admitting: Cardiology

## 2016-08-04 ENCOUNTER — Other Ambulatory Visit (HOSPITAL_COMMUNITY): Payer: Self-pay | Admitting: Internal Medicine

## 2016-08-04 DIAGNOSIS — I472 Ventricular tachycardia, unspecified: Secondary | ICD-10-CM

## 2016-08-04 DIAGNOSIS — I5022 Chronic systolic (congestive) heart failure: Secondary | ICD-10-CM

## 2016-08-04 DIAGNOSIS — I482 Chronic atrial fibrillation, unspecified: Secondary | ICD-10-CM

## 2016-08-04 DIAGNOSIS — Z95810 Presence of automatic (implantable) cardiac defibrillator: Secondary | ICD-10-CM

## 2016-08-04 DIAGNOSIS — I428 Other cardiomyopathies: Secondary | ICD-10-CM

## 2016-08-06 ENCOUNTER — Ambulatory Visit (HOSPITAL_COMMUNITY): Payer: Self-pay | Admitting: Pharmacist

## 2016-08-06 ENCOUNTER — Ambulatory Visit (HOSPITAL_COMMUNITY)
Admission: RE | Admit: 2016-08-06 | Discharge: 2016-08-06 | Disposition: A | Discharge status: Home / Self Care | Payer: 59 | Source: Ambulatory Visit | Attending: Internal Medicine | Admitting: Internal Medicine

## 2016-08-06 DIAGNOSIS — I472 Ventricular tachycardia, unspecified: Secondary | ICD-10-CM

## 2016-08-06 DIAGNOSIS — I428 Other cardiomyopathies: Secondary | ICD-10-CM | POA: Diagnosis not present

## 2016-08-06 DIAGNOSIS — I429 Cardiomyopathy, unspecified: Secondary | ICD-10-CM | POA: Diagnosis not present

## 2016-08-06 DIAGNOSIS — Z95810 Presence of automatic (implantable) cardiac defibrillator: Secondary | ICD-10-CM

## 2016-08-06 DIAGNOSIS — Z95811 Presence of heart assist device: Secondary | ICD-10-CM

## 2016-08-06 DIAGNOSIS — Z79899 Other long term (current) drug therapy: Secondary | ICD-10-CM | POA: Insufficient documentation

## 2016-08-06 DIAGNOSIS — I482 Chronic atrial fibrillation, unspecified: Secondary | ICD-10-CM

## 2016-08-06 DIAGNOSIS — I130 Hypertensive heart and chronic kidney disease with heart failure and stage 1 through stage 4 chronic kidney disease, or unspecified chronic kidney disease: Secondary | ICD-10-CM | POA: Insufficient documentation

## 2016-08-06 DIAGNOSIS — D649 Anemia, unspecified: Secondary | ICD-10-CM | POA: Diagnosis not present

## 2016-08-06 DIAGNOSIS — I5022 Chronic systolic (congestive) heart failure: Secondary | ICD-10-CM | POA: Diagnosis not present

## 2016-08-06 DIAGNOSIS — Z7901 Long term (current) use of anticoagulants: Secondary | ICD-10-CM | POA: Insufficient documentation

## 2016-08-06 DIAGNOSIS — N189 Chronic kidney disease, unspecified: Secondary | ICD-10-CM | POA: Insufficient documentation

## 2016-08-06 DIAGNOSIS — Z5189 Encounter for other specified aftercare: Secondary | ICD-10-CM | POA: Insufficient documentation

## 2016-08-06 LAB — CBC
HEMATOCRIT: 30.8 % — AB (ref 39.0–52.0)
Hemoglobin: 9.6 g/dL — ABNORMAL LOW (ref 13.0–17.0)
MCH: 22 pg — ABNORMAL LOW (ref 26.0–34.0)
MCHC: 31.2 g/dL (ref 30.0–36.0)
MCV: 70.5 fL — ABNORMAL LOW (ref 78.0–100.0)
Platelets: 126 10*3/uL — ABNORMAL LOW (ref 150–400)
RBC: 4.37 MIL/uL (ref 4.22–5.81)
RDW: 18.8 % — ABNORMAL HIGH (ref 11.5–15.5)
WBC: 4.9 10*3/uL (ref 4.0–10.5)

## 2016-08-06 LAB — COMPREHENSIVE METABOLIC PANEL
ALBUMIN: 3.6 g/dL (ref 3.5–5.0)
ALT: 10 U/L — ABNORMAL LOW (ref 17–63)
AST: 19 U/L (ref 15–41)
Alkaline Phosphatase: 87 U/L (ref 38–126)
Anion gap: 6 (ref 5–15)
BUN: 20 mg/dL (ref 6–20)
CHLORIDE: 109 mmol/L (ref 101–111)
CO2: 23 mmol/L (ref 22–32)
Calcium: 8.7 mg/dL — ABNORMAL LOW (ref 8.9–10.3)
Creatinine, Ser: 1.54 mg/dL — ABNORMAL HIGH (ref 0.61–1.24)
GFR calc Af Amer: 60 mL/min — ABNORMAL LOW (ref >60)
GFR calc non Af Amer: 52 mL/min — ABNORMAL LOW (ref >60)
Glucose, Bld: 80 mg/dL (ref 65–99)
POTASSIUM: 3.9 mmol/L (ref 3.5–5.1)
SODIUM: 138 mmol/L (ref 135–145)
Total Bilirubin: 0.9 mg/dL (ref 0.3–1.2)
Total Protein: 7.5 g/dL (ref 6.5–8.1)

## 2016-08-06 LAB — PROTIME-INR
INR: 2.16
PROTHROMBIN TIME: 24.4 s — AB (ref 11.4–15.2)

## 2016-08-06 LAB — LACTATE DEHYDROGENASE: LDH: 232 U/L — AB (ref 98–192)

## 2016-08-06 MED ORDER — PREGABALIN 75 MG PO CAPS: 75.0000 mg | ORAL_CAPSULE | Freq: Two times a day (BID) | ORAL | 5 refills | Status: DC

## 2016-08-06 NOTE — Progress Notes (Signed)
Patient presents for 2 week  follow up in [facility] today. Reports no problems with VAD equipment or concerns with drive line.  Vital Signs:  Doppler Pressure 120   Automatc BP: 123/81 (104) HR:96   SPO2:100  %  Weight: 195 lb w/o eqt Last weight: 197.6 lb   VAD Indication: Status 1B at [facility]  VAD interrogation & Equipment Management: Speed:9200 Flow: 4.2 Power:5.0 w    PI:7.4  Alarms: no clinical alarms Events: rare PI events  Fixed speed 9200 Low speed limit: 8600  Primary Controller:  Replace back up battery in 13 months. Back up controller:   Replace back up battery in 13 months.  Annual Equipment Maintenance on UBC/PM was performed on September 2018.   I reviewed the LVAD parameters from today and compared the results to the patient's prior recorded data. LVAD interrogation was NEGATIVE for significant power changes, NEGATIVE for clinical alarms and STABLE for PI events/speed drops. No programming changes were made and pump is functioning within specified parameters. Pt is performing daily controller and system monitor self tests along with completing weekly and monthly maintenance for LVAD equipment.  LVAD equipment check completed and is in good working order. Back-up equipment present. Charged back up battery and performed self-test on equipment.   Charged for PM battery this visit as it was changed due to power outage last Monday.  Exit Site Care: Patient presents to clinic today for drive line exit wound care. Existing VAD dressing removed and site care performed using sterile technique. Drive line exit site cleaned with Chlora prep applicators x 2, allowed to dry, and Sorbaview dressing with bio patch re-applied. Exit site healed and incorporated, the velour is fully implanted at exit site. No redness, tenderness, drainage, foul odor or rash noted. Drive line anchor re-applied. Pt denies fever or chills.   Significant Events on VAD Support:  08/2015 >> AKI  requiring dialysis, Bactremia / VAD Pocket infection requiring debriedment. GIB  Device:BS Single Lead Therapies: ON VF 220 Last check:   BP & Labs:  MAP 108 - Doppler is reflecting MAP  Hgb 9.6 - No S/S of bleeding. Specifically denies melena/BRBPR or nosebleeds.  LDH stable at 232 with established baseline of 230-330. Denies tea-colored urine. No power elevations noted on interrogation.   Plan- 1. Return to clinic next week for INR and dressing change 2. VAD visit scheduled for 2 months 3. Continue excercise and weight loss  Balinda Quails RN [place] Coordinator   Office: [phone] 24/7 Emergency VAD Pager: [phone]

## 2016-08-07 IMAGING — CR DG CHEST 1V PORT
1 series · 1 of 1 positions shown · non-contrast
Comparison: 12/21/2014

CLINICAL DATA: LVAD.

EXAM:
PORTABLE CHEST - 1 VIEW

[AP]
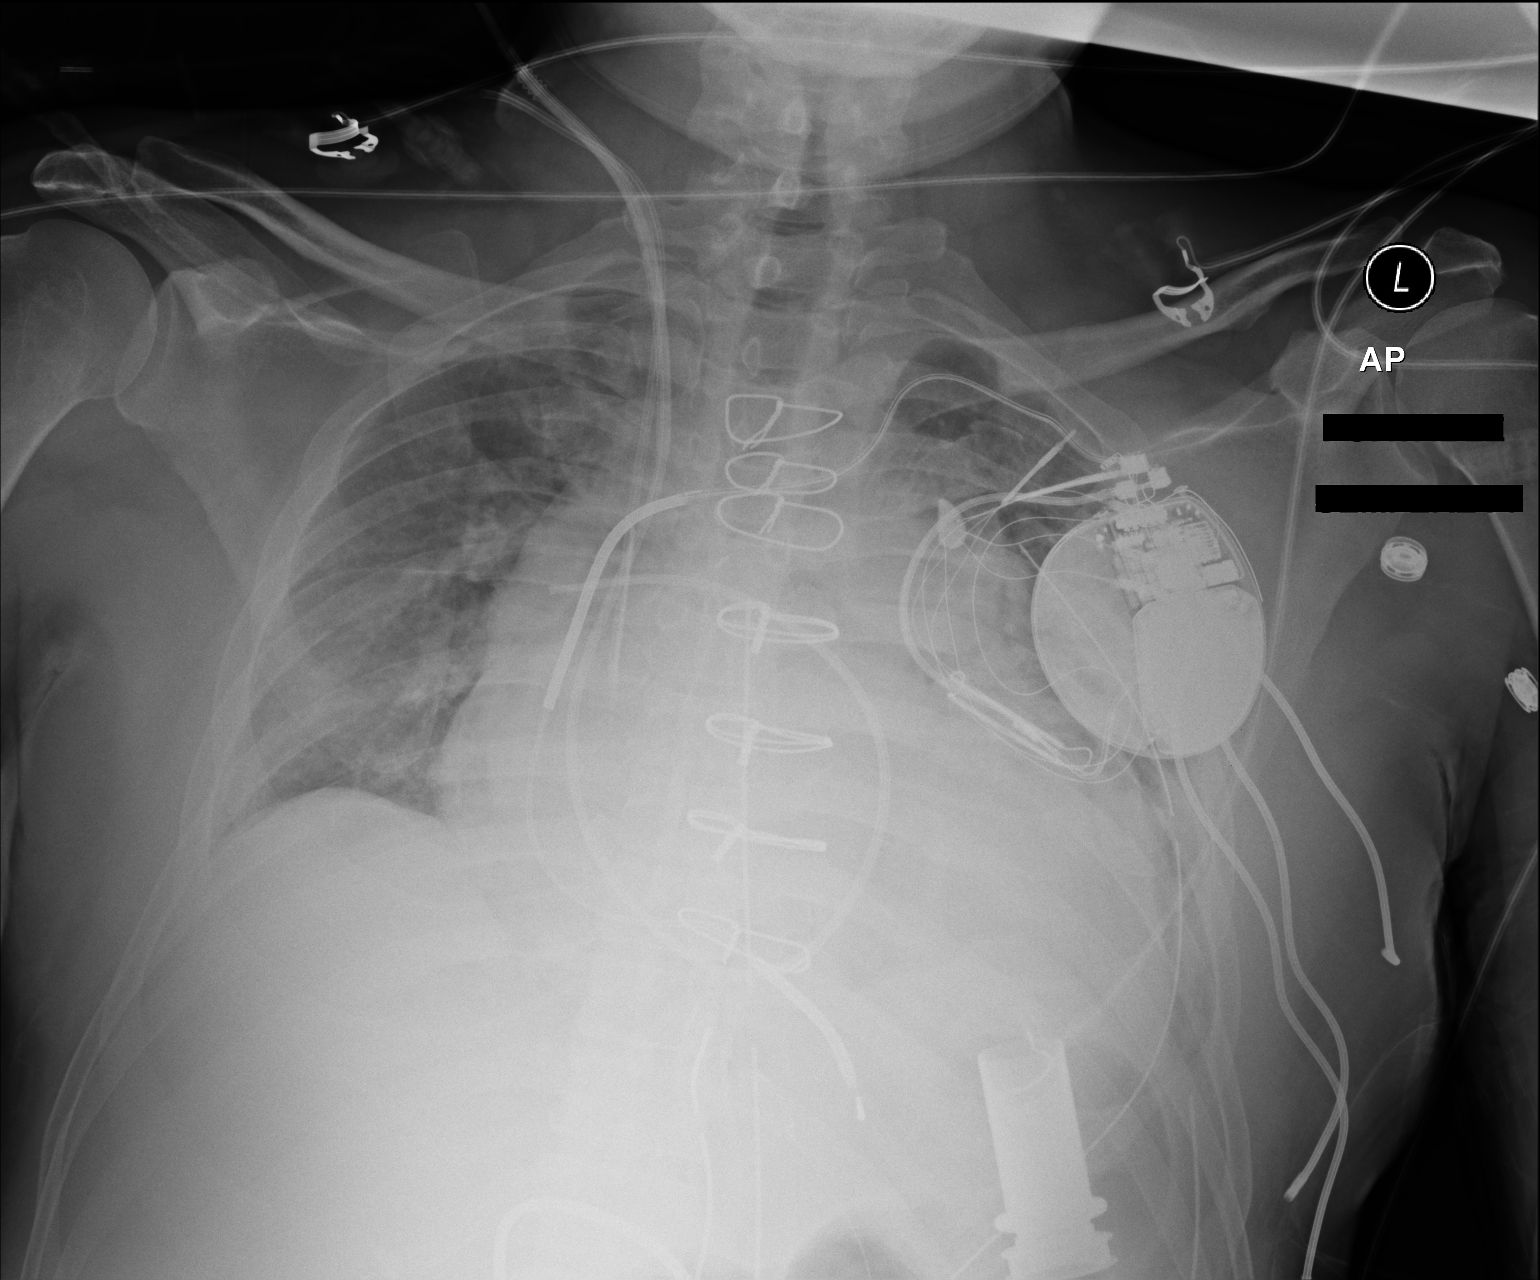

[1 of 1 positions shown; findings below may reference images not displayed]

FINDINGS: Endotracheal tube has been removed. Left-sided AICD leads to the
right ventricle. Right IJ Swan-Ganz catheter tip to the right
pulmonary artery. Right IJ central line tip to the superior vena
cava. Status post median sternotomy. Left ventricular assist device
overlies the cardiac apex.

Heart is enlarged and stable in appearance. Pulmonary vascular
congestion without overt edema. No new consolidations.
IMPRESSION: Status post removal of endotracheal tube. Stable appearance of left
ventricular assist device and other apparatus. Cardiomegaly and
pulmonary vascular congestion.

## 2016-08-08 NOTE — Progress Notes (Signed)
Patient ID: [name], male   DOB: November 04, 1970, 46 y.o.   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  Admitted from [facility] in [place] in 5/17 with LVAD driveline abscess. Hospitalization was complicated by septic shock, MSSA bacteremia, CDiff, and AKI.He underwent surgical I&D of wound with exposed LVAD hardware. TEE did not show any vegetation on valves or ICD wires. Over the course of his hospitalization he required multiple blood products due to symptomatic anemia and blood loss from abdominal wound. Treated with IV abx for multiple weeks. Developed AKI/ESRD and required HD for several months before recovering.   He returns for LVAD follow up. Recently seen at [facility] for transplant listing and now listed 1B.Overall feels great. Remains active without any CP or SOB. No orthopnea or PND. Marland Kitchen Edema resolved with torsemide and now takes prn. Continues to work [employer]. No bleeding or neuro issues. Compliant with all meds. VAD parameters stable.   Echo 1/18. EF 20%. 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 Severe TR. Modrerate MR. Trivial AI   VAD Indication: Status 1B at [facility]  VAD interrogation & Equipment Management: Speed:9200 Flow: 4.2 Power:5.0 w PI:7.4  Alarms: no clinical alarms Events: rare PI events  Fixed speed 9200 Low speed limit: 8600   Primary Controller: Replace back up battery in 36months. Back up controller: Replace back up battery in 39months.  Denies LVAD alarms.  Denies driveline trauma, erythema or drainage.  Denies ICD shocks.   Reports taking Coumadin as prescribed and adherence to anticoagulation based dietary restrictions.  Denies bright red blood per rectum or melena, no dark urine or hematuria.     Labs (5/17): K 5.5, creatinine 6.49 => 4.52, hgb 9, LDH 230, INR 2.38 Labs (12/26/2015): K 3.9 Creatinine 1.52   RHC 11/2015 RA = 6 RV = 23/8 PA = 25/4 (19) PCW = 6 Fick cardiac output/index = 4.8/2.6 PVR = 2.4 WU Ao sat = 98% PA sat = 49%, 51% 1. Well compensated hemodynamics with VAD support. 2. PA sat low in the setting of marked anemia (Hgb 7.7 g/dl)     Past Medical History:  Diagnosis Date  . AICD (automatic cardioverter/defibrillator) present   . AKI (acute kidney injury) ([place])   . Atrial fibrillation -parosysmal     Rx w amiodarone  . Automatic implantable cardiac defibrillator -BSX    single chamber  . CHF (congestive heart failure) ([place])   . Chronic systolic heart failure (HCC)    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%)  . Elevated LFTs   . GI bleed -massive    11 Units 2012  . Gout   . H/O hyperthyroidism   . Hypertension   . Noncompliance    H/O  MEDICAL NONCOMPLIANCE  . 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 Polymorphic ventricular tachycardia (HCC)    RECURRENT WITH APPROPRIATE SHOCK THERAPY IN THE PAST  . RA (rheumatoid arthritis) ([place])   . Severe mitral regurgitation   . Tricuspid valve regurgitation    SEVERE  . Ventricular fibrillation ([place])    WITH APPROPRIATE SHOCK THERAPY IN THE PAST    Current Outpatient Prescriptions  Medication Sig Dispense Refill  . allopurinol (ZYLOPRIM) 100  MG tablet Take 1 tablet (100 mg total) by mouth daily. 30 tablet 6  . citalopram (CELEXA) 20 MG tablet take 1 tablet by mouth once daily 30 tablet 6  . ferrous sulfate 324 (65 Fe) MG TBEC Take 1 tablet (325 mg total) by mouth 2 (two) times daily. 60 tablet 5  . furosemide (LASIX) 40 MG tablet Take 1 tablet (40 mg total) by mouth daily as needed (weight gain or swelling). 30 tablet 6  . potassium chloride SA (K-DUR,KLOR-CON) 20 MEQ tablet Take 20 mEq by mouth as needed (when take furosemide).    . pregabalin  (LYRICA) 75 MG capsule Take 1 capsule (75 mg total) by mouth 2 (two) times daily. 60 capsule 5  . sacubitril-valsartan (ENTRESTO) 97-103 MG Take 1 tablet by mouth 2 (two) times daily. 60 tablet 11  . sildenafil (REVATIO) 20 MG tablet Take 20 mg by mouth 3 (three) times daily.    . tamsulosin (FLOMAX) 0.4 MG CAPS capsule Take 0.4 mg by mouth daily.    Marland Kitchen warfarin (COUMADIN) 4 MG tablet Take 4-6 mg by mouth daily. Take 4 mg (1 tablet) daily except 6 mg (1 and 1/2 tablet) on Tuesday    . zolpidem (AMBIEN) 10 MG tablet Take 10 mg by mouth at bedtime as needed for sleep.    Marland Kitchen amoxicillin (AMOXIL) 500 MG capsule Take 4 capsules (2,000 mg total) by mouth as directed. 1 hour prior to dental appointment (Patient not taking: Reported on 08/06/2016) 4 capsule 1   No current facility-administered medications for this encounter.     Phytonadione  REVIEW OF SYSTEMS: All systems negative except as listed in HPI, PMH and Problem list.   VAD interrogation & Equipment Management:  LVAD Flow 4.5 Speed 9200 Power 5.2 PI 7.4 Rare PI  Vital Signs:  There were no vitals taken for this visit.   Vital Signs:  Doppler Pressure 120              Automatc BP: 123/81 (104) HR:96  SPO2:100  %  Weight: 195 lb w/o eqt Last weight: 197.6 lb   Physical Exam: GENERAL: Well appearing, male Ambulated in the clinic without difficulty  HEENT: normal. Anicteric NECK: Supple, JVP 5-6 No lymphadenopathy or thyromegaly appreciated.   CARDIAC:  Mechanical heart sounds with LVAD hum present.  Normal VAD sounds LUNGS: clear ABDOMEN:  Soft, round, nontender, good BS + distended  Dressing intact   LVAD exit site:  Site looks good EXTREMITIES:  Warm and dry, no cyanosis, clubbing, rash. No edema  NEUROLOGIC:  Alert and oriented x 4.  Moves all 4 without dififculty Affect pleasant.      ASSESSMENT AND PLAN:   1. Chronic systolic HF due to NICM:  --Status post HMII LVAD implantation 12/20/14.   --Status post long  hospitalization as complication of driveline infection in June 2017.  --He is doing well. NYHA I. He is being followed at [facility] for transplant and now listed 1B. Had [place] on 12/18/15 with well compensated hemodynamics.   --Volume status was up recently in the setting of dietary noncompliance (for his bday) now back to baseline. Can use lasix prn.  --Reinforced need for daily weights and reviewed use of sliding scale diuretics. -- MAP up a bit. Will follow. Titrate meds as needed.  --VAD parameters reviewed personally and are stable.  - Echo 1/18. Still with significant RV dysfunction and severe TR 2. RV failure:  Now off milrinone.   Off digoxin with renal failure.  --  echo as above. Doing well clinically. Continue sildenafil 3. HTN:  --MAP still up. Increase Entresto to 97/10s bid.  4. Atrial fibrillation: Mostly chronic.  --Well tolerated. On coumadin for VAD. He is no longer on amiodarone.  5. Anticoagulation management: INR goal 2.0-2.5, no aspirin. INR 2.16 today. Discussed with PharmD. LDH 232  6  CKD --creatinine stable at 1.54 7. Driveline infection: wound completely healed. Has seen ID. Now off abx.  8. Anemia --Hgb stable 9.6.     Glori Bickers MD 08/08/2016

## 2016-08-10 IMAGING — CR DG CHEST 1V PORT
1 series · 1 of 1 positions shown · non-contrast
Comparison: 12/24/2014 .

CLINICAL DATA: Left ventricular assist device.

EXAM:
PORTABLE CHEST - 1 VIEW

[AP]
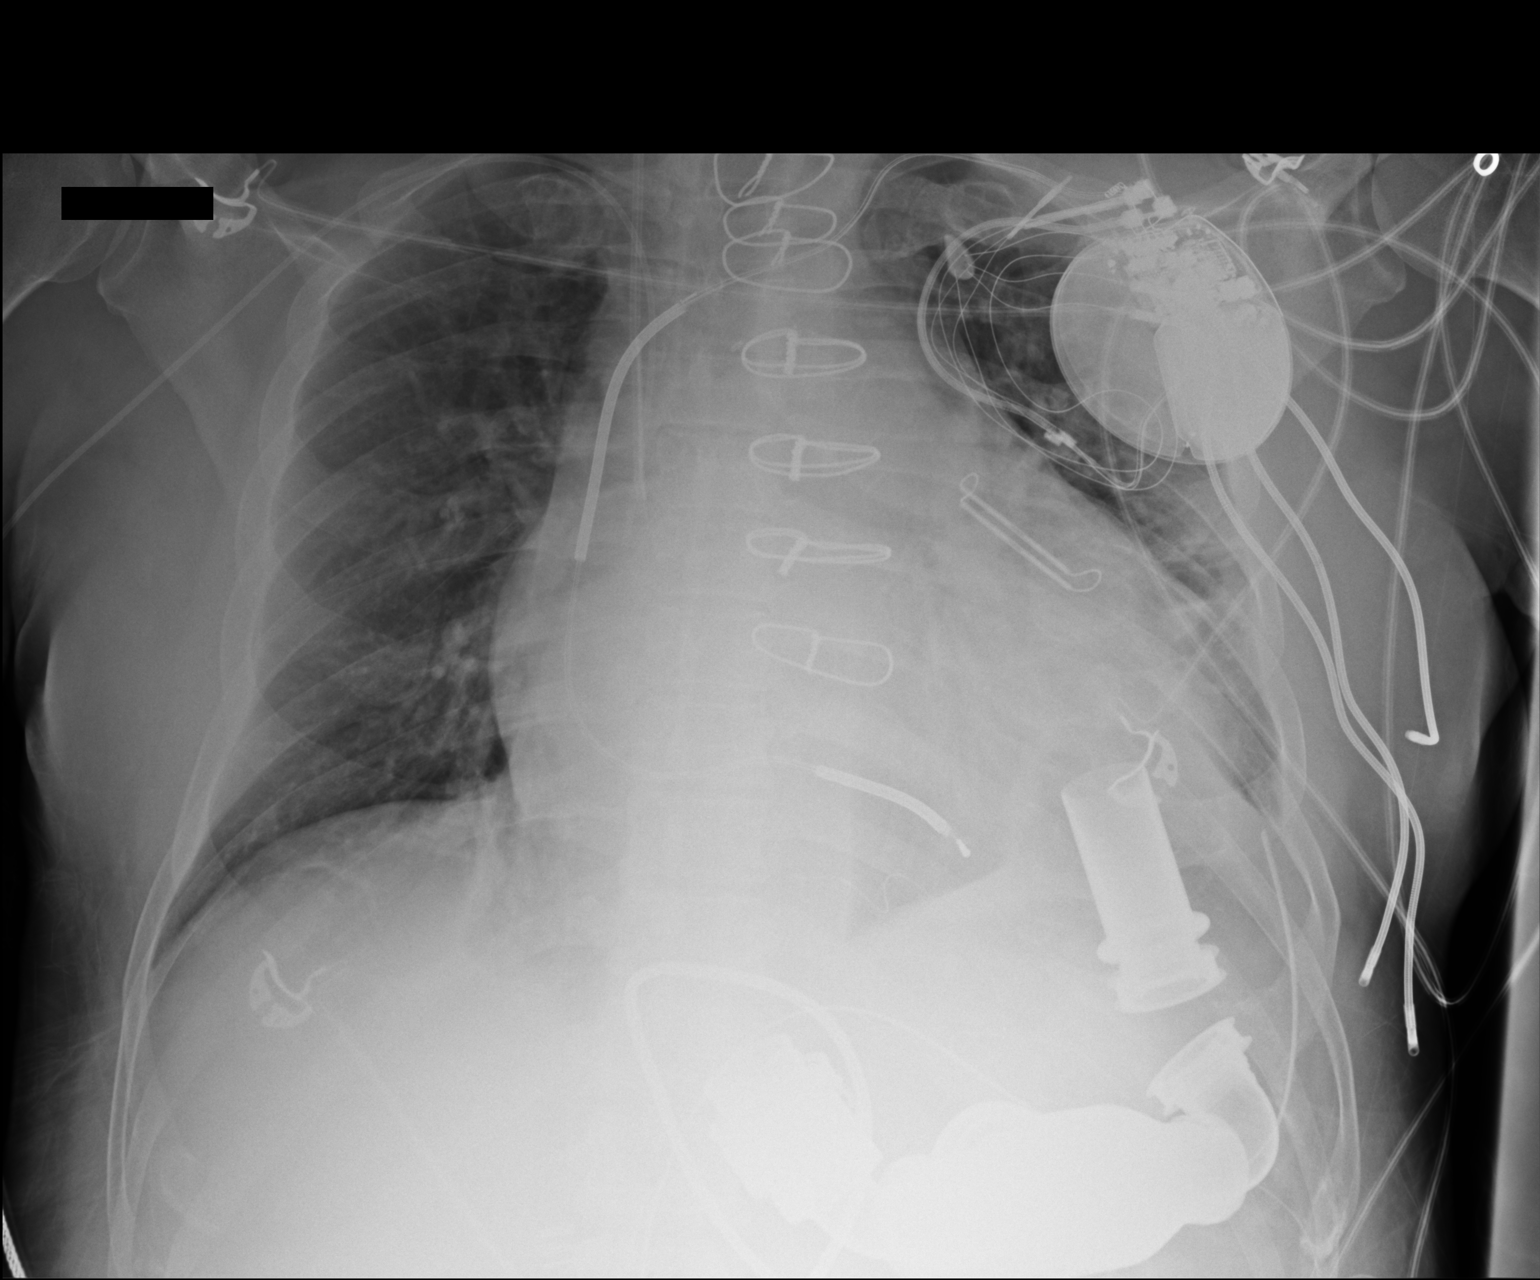

[1 of 1 positions shown; findings below may reference images not displayed]

FINDINGS: Right IJ line, right PICC line in stable position. AICD in left
ventricular assist device in stable position. Prior median
sternotomy. Left atrial appendage clip noted. Stable cardiomegaly.
No pulmonary venous congestion. Low lung volumes with mild bibasilar
and left mid lung field subsegmental atelectasis.
IMPRESSION: 1. Lines and tubes in stable position.
2. Prior median sternotomy. Left atrial appendage clip noted. AICD
and left ventricular assist device in stable position. Stable
cardiomegaly. No pulmonary venous congestion.
3. Low lung volumes with mild bibasilar and left mid lung field
subsegmental atelectasis.

## 2016-08-13 ENCOUNTER — Ambulatory Visit (HOSPITAL_COMMUNITY)
Admission: RE | Admit: 2016-08-13 | Discharge: 2016-08-13 | Disposition: A | Discharge status: Home / Self Care | Payer: 59 | Source: Ambulatory Visit | Attending: Cardiology | Admitting: Cardiology

## 2016-08-13 ENCOUNTER — Ambulatory Visit (HOSPITAL_COMMUNITY): Payer: Self-pay | Admitting: Pharmacist

## 2016-08-13 DIAGNOSIS — Z7901 Long term (current) use of anticoagulants: Secondary | ICD-10-CM

## 2016-08-13 DIAGNOSIS — Z95811 Presence of heart assist device: Secondary | ICD-10-CM | POA: Insufficient documentation

## 2016-08-13 LAB — PROTIME-INR
INR: 2.37
PROTHROMBIN TIME: 26.3 s — AB (ref 11.4–15.2)

## 2016-08-20 ENCOUNTER — Ambulatory Visit (HOSPITAL_COMMUNITY)
Admission: RE | Admit: 2016-08-20 | Discharge: 2016-08-20 | Disposition: A | Discharge status: Home / Self Care | Payer: 59 | Source: Ambulatory Visit | Attending: Internal Medicine | Admitting: Internal Medicine

## 2016-08-20 ENCOUNTER — Ambulatory Visit (HOSPITAL_COMMUNITY): Payer: Self-pay | Admitting: Pharmacist

## 2016-08-20 DIAGNOSIS — Z95811 Presence of heart assist device: Secondary | ICD-10-CM

## 2016-08-20 LAB — PROTIME-INR
INR: 2.35
Prothrombin Time: 26.1 seconds — ABNORMAL HIGH (ref 11.4–15.2)

## 2016-08-20 NOTE — Progress Notes (Signed)
Patient presents to clinic today for drive line exit wound care. Existing VAD dressing removed and site care performed using sterile technique. Drive line exit site cleaned with Chlora prep applicators x 2, allowed to dry, and Sorbaview dressing with bio patch re-applied. Exit site healed and incorporated, the velour is fully implanted at exit site. No redness, tenderness, drainage, foul odor or rash noted. Drive line anchor re-applied. Pt denies fever or chills.   Return in 1 week for another dressing change per standard of care. INR to be repeated in 1-2 weeks pending results per anticoagulation protocol.    Balinda Quails RN, VAD Coordinator 24/7 pager [phone]

## 2016-08-26 ENCOUNTER — Ambulatory Visit (HOSPITAL_COMMUNITY): Payer: Self-pay | Admitting: Pharmacist

## 2016-08-26 ENCOUNTER — Ambulatory Visit (HOSPITAL_COMMUNITY)
Admission: RE | Admit: 2016-08-26 | Discharge: 2016-08-26 | Disposition: A | Discharge status: Home / Self Care | Payer: 59 | Source: Ambulatory Visit | Attending: Cardiology | Admitting: Cardiology

## 2016-08-26 DIAGNOSIS — I482 Chronic atrial fibrillation, unspecified: Secondary | ICD-10-CM

## 2016-08-26 DIAGNOSIS — Z95811 Presence of heart assist device: Secondary | ICD-10-CM | POA: Diagnosis present

## 2016-08-26 LAB — PROTIME-INR
INR: 1.99
Prothrombin Time: 22.9 seconds — ABNORMAL HIGH (ref 11.4–15.2)

## 2016-08-26 NOTE — Progress Notes (Signed)
Patient presents to clinic today for drive line exit wound care. Existing VAD dressing removed and site care performed using sterile technique. Drive line exit site cleaned with Chlora prep applicators x 2, allowed to dry, and Sorbaview dressing with bio patch re-applied. Exit site healed and incorporated, the velour is fully implanted at exit site. No redness, tenderness, drainage, foul odor or rash noted. Drive line anchor re-applied. Pt denies fever or chills.   Return in 1 week for another dressing change per standard of care. INR to be repeated in 1 week pending results per anticoagulation protocol.   Rourke Mcquitty RN, VAD Coordinator 24/7 pager [phone]  

## 2016-08-28 ENCOUNTER — Other Ambulatory Visit (HOSPITAL_COMMUNITY): Payer: Self-pay | Admitting: *Deleted

## 2016-08-28 DIAGNOSIS — D709 Neutropenia, unspecified: Secondary | ICD-10-CM

## 2016-09-03 ENCOUNTER — Ambulatory Visit (HOSPITAL_COMMUNITY)
Admission: RE | Admit: 2016-09-03 | Discharge: 2016-09-03 | Disposition: A | Discharge status: Home / Self Care | Payer: 59 | Source: Ambulatory Visit | Attending: Internal Medicine | Admitting: Internal Medicine

## 2016-09-03 ENCOUNTER — Encounter (HOSPITAL_COMMUNITY): Payer: Self-pay | Admitting: *Deleted

## 2016-09-03 ENCOUNTER — Other Ambulatory Visit (HOSPITAL_COMMUNITY): Payer: Self-pay | Admitting: *Deleted

## 2016-09-03 ENCOUNTER — Ambulatory Visit (HOSPITAL_COMMUNITY): Payer: Self-pay | Admitting: Pharmacist

## 2016-09-03 DIAGNOSIS — Z5181 Encounter for therapeutic drug level monitoring: Secondary | ICD-10-CM | POA: Insufficient documentation

## 2016-09-03 LAB — PROTIME-INR
INR: 2.55
Prothrombin Time: 27.9 seconds — ABNORMAL HIGH (ref 11.4–15.2)

## 2016-09-03 NOTE — Progress Notes (Unsigned)
Patient presents to clinic today for drive line exit wound care. Existing VAD dressing removed and site care performed using sterile technique. Drive line exit site cleaned with Chlora prep applicators x 2, allowed to dry, and Sorbaview dressing with bio patch re-applied. Exit site healed and incorporated, the velour is fully implanted at exit site. No redness, tenderness, drainage, foul odor or rash noted. Drive line anchor re-applied. Pt denies fever or chills.   Return in 1 week for another dressing change per standard of care. INR to be repeated in 1-2 weeks pending results per anticoagulation protocol.   Referral placed for hematology/oncology per [facility] request due to WBC trend. Left message for scheduler to call patient for appointment.  Balinda Quails RN, VAD Coordinator 24/7 pager [phone]

## 2016-09-10 ENCOUNTER — Other Ambulatory Visit (HOSPITAL_COMMUNITY): Payer: Self-pay | Admitting: *Deleted

## 2016-09-10 ENCOUNTER — Ambulatory Visit (HOSPITAL_COMMUNITY)
Admission: RE | Admit: 2016-09-10 | Discharge: 2016-09-10 | Disposition: A | Discharge status: Home / Self Care | Payer: 59 | Source: Ambulatory Visit | Attending: Internal Medicine | Admitting: Internal Medicine

## 2016-09-10 ENCOUNTER — Ambulatory Visit (HOSPITAL_COMMUNITY): Payer: Self-pay | Admitting: Pharmacist

## 2016-09-10 DIAGNOSIS — Z95811 Presence of heart assist device: Secondary | ICD-10-CM

## 2016-09-10 DIAGNOSIS — Z7901 Long term (current) use of anticoagulants: Secondary | ICD-10-CM

## 2016-09-10 LAB — PROTIME-INR
INR: 2.2
PROTHROMBIN TIME: 24.8 s — AB (ref 11.4–15.2)

## 2016-09-10 NOTE — Progress Notes (Addendum)
Patient presents to clinic today for drive line exit wound care. Existing VAD dressing removed and site care performed using sterile technique. Drive line exit site cleaned with Chlora prep applicators x 2, allowed to dry, and Sorbaview dressing with bio patch re-applied. Exit site healed and incorporated, the velour is fully implanted at exit site. No redness, tenderness, drainage, foul odor or rash noted. Drive line anchor re-applied. Pt denies fever or chills.   Return in 1 week for another dressing change per standard of care. INR to be repeated in 1-2 weeks pending results per anticoagulation protocol.    Zada Girt,  RN, [place] Coordinator 24/7 pager [phone]

## 2016-09-10 NOTE — Addendum Note (Signed)
Encounter addended by: Lezlie Octave, RN on: 09/10/2016  3:42 PM<BR>    Actions taken: Sign clinical note

## 2016-09-17 ENCOUNTER — Ambulatory Visit (HOSPITAL_COMMUNITY)
Admission: RE | Admit: 2016-09-17 | Discharge: 2016-09-17 | Disposition: A | Discharge status: Home / Self Care | Payer: 59 | Source: Ambulatory Visit | Attending: Internal Medicine | Admitting: Internal Medicine

## 2016-09-17 DIAGNOSIS — Z95811 Presence of heart assist device: Secondary | ICD-10-CM

## 2016-09-17 DIAGNOSIS — Z4800 Encounter for change or removal of nonsurgical wound dressing: Secondary | ICD-10-CM | POA: Diagnosis present

## 2016-09-17 NOTE — Progress Notes (Signed)
Patient presents to clinic today for drive line exit wound care. Existing VAD dressing removed and site care performed using sterile technique. Drive line exit site cleaned with Chlora prep applicators x 2, allowed to dry, and Sorbaview dressing with bio patch re-applied. Exit site healed and incorporated, the velour is fully implanted at exit site. No redness, tenderness, drainage, foul odor or rash noted. Drive line anchor re-applied. Pt denies fever or chills.   Return in 1 week for another dressing change per standard of care.  Tanda Rockers RN, VAD Coordinator 24/7 pager [phone]

## 2016-09-24 ENCOUNTER — Ambulatory Visit (HOSPITAL_COMMUNITY)
Admission: RE | Admit: 2016-09-24 | Discharge: 2016-09-24 | Disposition: A | Discharge status: Home / Self Care | Payer: 59 | Source: Ambulatory Visit | Attending: Internal Medicine | Admitting: Internal Medicine

## 2016-09-24 ENCOUNTER — Other Ambulatory Visit (HOSPITAL_COMMUNITY): Payer: Self-pay | Admitting: *Deleted

## 2016-09-24 DIAGNOSIS — Z95811 Presence of heart assist device: Secondary | ICD-10-CM

## 2016-09-24 DIAGNOSIS — Z4801 Encounter for change or removal of surgical wound dressing: Secondary | ICD-10-CM | POA: Insufficient documentation

## 2016-09-24 DIAGNOSIS — Z7901 Long term (current) use of anticoagulants: Secondary | ICD-10-CM

## 2016-09-24 NOTE — Progress Notes (Signed)
Patient presents to clinic today for drive line exit wound care. Existing VAD dressing removed and site care performed using sterile technique. Drive line exit site cleaned with Chlora prep applicators x 2, allowed to dry, and Sorbaview dressing with bio patch re-applied. Exit site healed and incorporated, the velour is fully implanted at exit site. No redness, tenderness, drainage, foul odor or rash noted. Drive line anchor re-applied. Pt denies fever or chills.   Return in 1 week for another dressing change per standard of care.   Zada Girt,  RN, [place] Coordinator 24/7 pager [phone]

## 2016-09-26 ENCOUNTER — Telehealth: Payer: Self-pay | Admitting: Hematology

## 2016-09-26 ENCOUNTER — Encounter: Payer: Self-pay | Admitting: Hematology

## 2016-09-26 NOTE — Telephone Encounter (Signed)
Appt has been scheduled for the pt to see Dr. Irene Limbo on 7/17 at 11am. Demographics verified. Letter mailed to the pt.

## 2016-10-01 ENCOUNTER — Ambulatory Visit (HOSPITAL_COMMUNITY)
Admission: RE | Admit: 2016-10-01 | Discharge: 2016-10-01 | Disposition: A | Discharge status: Home / Self Care | Payer: 59 | Source: Ambulatory Visit | Attending: Internal Medicine | Admitting: Internal Medicine

## 2016-10-01 DIAGNOSIS — Z95811 Presence of heart assist device: Secondary | ICD-10-CM

## 2016-10-01 NOTE — Progress Notes (Signed)
Patient presents to clinic today for drive line exit wound care. Existing VAD dressing removed and site care performed using sterile technique. Drive line exit site cleaned with Chlora prep applicators x 2, allowed to dry, and Sorbaview dressing with bio patch re-applied. Exit site healed and incorporated, the velour is fully implanted at exit site. No redness, tenderness, drainage, foul odor or rash noted. Drive line anchor re-applied. Pt denies fever or chills.   Tanda Rockers RN, VAD Coordinator 24/7 pager [phone]

## 2016-10-06 ENCOUNTER — Ambulatory Visit (HOSPITAL_COMMUNITY): Payer: Self-pay | Admitting: Pharmacist

## 2016-10-06 ENCOUNTER — Encounter (HOSPITAL_COMMUNITY): Payer: Self-pay | Admitting: *Deleted

## 2016-10-06 ENCOUNTER — Ambulatory Visit (HOSPITAL_COMMUNITY)
Admission: RE | Admit: 2016-10-06 | Discharge: 2016-10-06 | Disposition: A | Discharge status: Home / Self Care | Payer: 59 | Source: Ambulatory Visit | Attending: Internal Medicine | Admitting: Internal Medicine

## 2016-10-06 ENCOUNTER — Encounter (HOSPITAL_COMMUNITY): Payer: Self-pay

## 2016-10-06 VITALS — BP 130/93 | HR 91 | Resp 16 | Ht 67.0 in | Wt 198.2 lb

## 2016-10-06 DIAGNOSIS — I482 Chronic atrial fibrillation: Secondary | ICD-10-CM | POA: Diagnosis not present

## 2016-10-06 DIAGNOSIS — D649 Anemia, unspecified: Secondary | ICD-10-CM | POA: Diagnosis not present

## 2016-10-06 DIAGNOSIS — I429 Cardiomyopathy, unspecified: Secondary | ICD-10-CM | POA: Diagnosis not present

## 2016-10-06 DIAGNOSIS — Z95810 Presence of automatic (implantable) cardiac defibrillator: Secondary | ICD-10-CM | POA: Insufficient documentation

## 2016-10-06 DIAGNOSIS — Z95811 Presence of heart assist device: Secondary | ICD-10-CM

## 2016-10-06 DIAGNOSIS — I132 Hypertensive heart and chronic kidney disease with heart failure and with stage 5 chronic kidney disease, or end stage renal disease: Secondary | ICD-10-CM | POA: Insufficient documentation

## 2016-10-06 DIAGNOSIS — E0590 Thyrotoxicosis, unspecified without thyrotoxic crisis or storm: Secondary | ICD-10-CM | POA: Insufficient documentation

## 2016-10-06 DIAGNOSIS — M069 Rheumatoid arthritis, unspecified: Secondary | ICD-10-CM | POA: Insufficient documentation

## 2016-10-06 DIAGNOSIS — I10 Essential (primary) hypertension: Secondary | ICD-10-CM | POA: Diagnosis not present

## 2016-10-06 DIAGNOSIS — M109 Gout, unspecified: Secondary | ICD-10-CM | POA: Diagnosis not present

## 2016-10-06 DIAGNOSIS — I5022 Chronic systolic (congestive) heart failure: Secondary | ICD-10-CM | POA: Insufficient documentation

## 2016-10-06 DIAGNOSIS — N186 End stage renal disease: Secondary | ICD-10-CM | POA: Insufficient documentation

## 2016-10-06 DIAGNOSIS — Z7901 Long term (current) use of anticoagulants: Secondary | ICD-10-CM | POA: Insufficient documentation

## 2016-10-06 LAB — PROTIME-INR
INR: 2.11
PROTHROMBIN TIME: 24 s — AB (ref 11.4–15.2)

## 2016-10-06 LAB — CBC
HCT: 40.2 % (ref 39.0–52.0)
Hemoglobin: 12.9 g/dL — ABNORMAL LOW (ref 13.0–17.0)
MCH: 23.4 pg — ABNORMAL LOW (ref 26.0–34.0)
MCHC: 32.1 g/dL (ref 30.0–36.0)
MCV: 72.8 fL — AB (ref 78.0–100.0)
PLATELETS: 135 10*3/uL — AB (ref 150–400)
RBC: 5.52 MIL/uL (ref 4.22–5.81)
RDW: 17.7 % — AB (ref 11.5–15.5)
WBC: 3.7 10*3/uL — AB (ref 4.0–10.5)

## 2016-10-06 LAB — LACTATE DEHYDROGENASE: LDH: 253 U/L — AB (ref 98–192)

## 2016-10-06 LAB — BASIC METABOLIC PANEL
Anion gap: 7 (ref 5–15)
BUN: 17 mg/dL (ref 6–20)
CHLORIDE: 108 mmol/L (ref 101–111)
CO2: 21 mmol/L — ABNORMAL LOW (ref 22–32)
CREATININE: 1.61 mg/dL — AB (ref 0.61–1.24)
Calcium: 8.7 mg/dL — ABNORMAL LOW (ref 8.9–10.3)
GFR, EST AFRICAN AMERICAN: 57 mL/min — AB (ref >60)
GFR, EST NON AFRICAN AMERICAN: 49 mL/min — AB (ref >60)
Glucose, Bld: 89 mg/dL (ref 65–99)
Potassium: 3.6 mmol/L (ref 3.5–5.1)
SODIUM: 136 mmol/L (ref 135–145)

## 2016-10-06 MED ORDER — AMLODIPINE BESYLATE 5 MG PO TABS: 5.0000 mg | ORAL_TABLET | Freq: Every day | ORAL | 6 refills | Status: DC

## 2016-10-06 NOTE — Progress Notes (Signed)
Patient presents for 2 month  follow up in [facility] today. Reports no problems with VAD equipment or concerns with drive line.  Vital Signs:  Doppler Pressure 110   Automatc BP: 130/93 (112) HR:91   SPO2:98  %  Weight: 198.2 lb w/o eqt Last weight: 195 lb Home weights: 190-195 lbs   VAD Indication: Bridge to Transplant-Status 1B at [facility]    VAD interrogation & Equipment Management: Speed:9200 Flow: 4.6 Power:5.2 w    PI:7.6  Alarms: no clinical alarms Events: none  Fixed speed 9200  Low speed limit: 8600  Primary Controller:  Replace back up battery in 59months. Back up controller:   Replace back up battery in 11 months.  Annual Equipment Maintenance on UBC/PM was performed on 12/2016.   I reviewed the LVAD parameters from today and compared the results to the patient's prior recorded data. LVAD interrogation was NEGATIVE for significant power changes, NEGATIVE for clinical alarms and STABLE for PI events/speed drops. No programming changes were made and pump is functioning within specified parameters. Pt is performing daily controller and system monitor self tests along with completing weekly and monthly maintenance for LVAD equipment.  LVAD equipment check completed and is in good working order. Back-up equipment present. Charged back up battery and performed self-test on equipment.   Exit Site Care: Drive line is being maintained weekly  by VAD Coordinators. Drive line exit site well healed and incorporated. The velour is fully implanted at exit site. Dressing dry and intact. No erythema or drainage. Stabilization device present and accurately applied. Pt denies fever or chills. Pt states they have adequate dressing supplies at home.   Significant Events on VAD Support:  08/2015 >> AKI requiring dialysis, Bactremia / VAD Pocket infection requiring debriedment. GIB  Device:BS Single Lead Therapies: on VF 220   BP & Labs:  MAP 110 - Doppler is reflecting  MAP  Hgb 12.9 - No S/S of bleeding. Specifically denies melena/BRBPR or nosebleeds.  LDH stable at 253 with established baseline of 230- 330. Denies tea-colored urine. No power elevations noted on interrogation.   Plan: 1. Start Norvasc 5 mg daily 2. RTC in one week for dressing change and BP check 3. Appt with heme/Onc in July for decreased WBC per [facility] request. 4. 2 month VAD appt made at this visit.  Balinda Quails RN [place] Coordinator   Office: [phone] 24/7 Emergency VAD Pager: [phone]

## 2016-10-06 NOTE — Patient Instructions (Signed)
Return to clinic in one week for dressing change and blood pressures check. And again in 2 months for VAD visit.  Start Norvasc 5 mg daily. Prescription sent into your pharmacy.

## 2016-10-07 NOTE — Progress Notes (Signed)
Patient ID: [name], male   DOB: 10-03-1970, 46 y.o.   MRN: [id number]    LVAD CLINIC NOTE  CHF: [place] Klas   HPI: [name] is a 46 y/o male with systolic HF due to NICM (EF 20-25%) s/p [employer] ICD, chronic AF and gout who underwent HM II VAD implantation on 12/20/14. 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  Admitted from [facility] in [place] in 5/17 with LVAD driveline abscess. Hospitalization was complicated by septic shock, MSSA bacteremia, CDiff, and AKI.He underwent surgical I&D of wound with exposed LVAD hardware. TEE did not show any vegetation on valves or ICD wires. Over the course of his hospitalization he required multiple blood products due to symptomatic anemia and blood loss from abdominal wound. Treated with IV abx for multiple weeks. Developed AKI/ESRD and required HD for several months before recovering.   He returns for LVAD follow up. Being followed at [facility] and remains 1B.Doing very well. Continues to work without problems. Remains active. No CP or SOB. Feels really good. Takes torsemide prn for edema. No bleeding or neuro issues. Complaint with all meds. No VAD alarms  Echo 1/18. EF 20%. 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 Severe TR. Modrerate MR. Trivial AI  VAD Indication: Bridge to Transplant-Status 1B at [facility]    VAD interrogation & Equipment Management: Speed:9200 Flow: 4.6 Power:5.2 w PI:7.6  Alarms: no clinical alarms Events: none  Fixed speed 9200        Low speed limit: 8600  Primary Controller: Replace back up battery in 34months. Back up controller: Replace back up battery in 22months. Labs (5/17): K 5.5, creatinine 6.49 => 4.52, hgb 9, LDH 230, INR 2.38 Labs (12/26/2015): K 3.9 Creatinine 1.52   RHC 11/2015 RA = 6 RV = 23/8 PA = 25/4 (19) PCW = 6 Fick cardiac output/index = 4.8/2.6 PVR = 2.4 WU Ao sat = 98% PA sat = 49%, 51% 1. Well compensated hemodynamics with VAD support. 2.  PA sat low in the setting of marked anemia (Hgb 7.7 g/dl)     Past Medical History:  Diagnosis Date  . AICD (automatic cardioverter/defibrillator) present   . AKI (acute kidney injury) ([place])   . Atrial fibrillation -parosysmal     Rx w amiodarone  . Automatic implantable cardiac defibrillator -BSX    single chamber  . CHF (congestive heart failure) ([place])   . Chronic systolic heart failure (HCC)    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%)  . Elevated LFTs   . GI bleed -massive    11 Units 2012  . Gout   . H/O hyperthyroidism   . Hypertension   . Noncompliance    H/O  MEDICAL NONCOMPLIANCE  . 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 Polymorphic ventricular tachycardia (HCC)    RECURRENT WITH APPROPRIATE SHOCK THERAPY IN THE PAST  . RA (rheumatoid arthritis) ([place])   . Severe mitral regurgitation   . Tricuspid valve regurgitation    SEVERE  . Ventricular fibrillation ([place])    WITH APPROPRIATE SHOCK THERAPY IN THE PAST    Current Outpatient Prescriptions  Medication Sig Dispense Refill  . allopurinol (ZYLOPRIM) 100 MG tablet Take 1 tablet (100 mg total) by mouth daily. 30 tablet 6  . citalopram (CELEXA) 20 MG tablet take 1 tablet by mouth once daily 30 tablet 6  . ferrous sulfate 324 (65 Fe) MG TBEC Take 1 tablet (325 mg total) by mouth 2 (  two) times daily. 60 tablet 5  . furosemide (LASIX) 40 MG tablet Take 1 tablet (40 mg total) by mouth daily as needed (weight gain or swelling). 30 tablet 6  . potassium chloride SA (K-DUR,KLOR-CON) 20 MEQ tablet Take 20 mEq by mouth as needed (when take furosemide).    . pregabalin (LYRICA) 75 MG capsule Take 1 capsule (75 mg total) by mouth 2 (two) times daily. (Patient taking differently: Take 75 mg by mouth daily. ) 60 capsule 5  . sacubitril-valsartan (ENTRESTO) 97-103 MG Take 1 tablet by mouth 2 (two) times daily. 60 tablet 11  . sildenafil (REVATIO) 20 MG tablet Take 20 mg by mouth 3  (three) times daily.    . tamsulosin (FLOMAX) 0.4 MG CAPS capsule Take 0.4 mg by mouth daily.    Marland Kitchen warfarin (COUMADIN) 4 MG tablet Take 4-6 mg by mouth daily. Take 4 mg (1 tablet) daily except 6 mg (1 and 1/2 tablet) on Tuesday    . zolpidem (AMBIEN) 10 MG tablet Take 10 mg by mouth at bedtime as needed for sleep.    Marland Kitchen amLODipine (NORVASC) 5 MG tablet Take 1 tablet (5 mg total) by mouth daily. 90 tablet 6  . amoxicillin (AMOXIL) 500 MG capsule Take 4 capsules (2,000 mg total) by mouth as directed. 1 hour prior to dental appointment (Patient not taking: Reported on 10/06/2016) 4 capsule 1   No current facility-administered medications for this encounter.     Phytonadione  REVIEW OF SYSTEMS: All systems negative except as listed in HPI, PMH and Problem list.  Vital Signs:  BP (!) 130/93   Pulse 91   Resp 16   Ht 5\' 7"  (1.702 m)   Wt 198 lb 3.2 oz (89.9 kg)   SpO2 97%   BMI 31.04 kg/m    Vital Signs:  Doppler Pressure 110              Automatc BP: 130/93 (112) HR:91  SPO2:98  %  Weight: 198.2 lb w/o eqt Last weight: 195 lb Home weights: 190-195 lbs  Physical Exam: GENERAL: Well appearing, male Ambulated in the clinic without difficulty  HEENT: normal. anicteric NECK: Supple, JVP 6 No lymphadenopathy or thyromegaly appreciated.   CARDIAC:  Mechanical heart sounds with LVAD hum present.  Normal HM-2 sounds LUNGS: clear no wheezing ABDOMEN:  Soft, round, NT/ND good BS LVAD exit site:  Site looks good. Well incorporate. No drainage or erythema around driveline.  Extremities: no cyanosis, clubbing, rash, edema Neuro: alert & oriented x 3, cranial nerves grossly intact. moves all 4 extremities w/o difficulty. Affect pleasant   ASSESSMENT AND PLAN:   1. Chronic systolic HF due to NICM:  --Status post HMII LVAD implantation 12/20/14.   --Status post long hospitalization as complication of driveline infection in June 2017.  --Remains NYHA I. He is being followed at [facility] for  transplant and now listed 1B. Had [place] on 12/18/15 with well compensated hemodynamics.  F/u at [facility] later this month . --Volume status stable. Use torsemide prn  --Reinforced need for daily weights and reviewed use of sliding scale diuretics. -- MAP up a bit. Will follow. Titrate meds as needed.  --VAD parameters personally reviewed - stable - Echo 1/18. Still with significant RV dysfunction and severe TR 2. RV failure:  Now off milrinone.   Off digoxin with renal failure.  --echo as above. Doing well clinically. Continue sildenafil can titrate as needed 3. HTN:  --MAP still up despite recent increase of Entresto to  97/103 bid.  --Add amlodipine 5 daily 4. Atrial fibrillation: Mostly chronic.  --Well tolerated. On coumadin for VAD. He is no longer on amiodarone.  5. Anticoagulation management: INR goal 2.0-2.5, no aspirin. INR 2.11 today. Discussed with Pharm D LDH 253 6  CKD --creatinine stable at 1.61 7. Driveline infection: wound completely healed. Has seen ID. Now off abx.  8. Anemia --Hgb improved 12.9 today    Total time spent 35 minutes. Over half that time spent discussing above.   Glori Bickers MD 10/07/2016

## 2016-10-09 ENCOUNTER — Other Ambulatory Visit (HOSPITAL_COMMUNITY): Payer: Self-pay | Admitting: Cardiology

## 2016-10-09 DIAGNOSIS — Z95811 Presence of heart assist device: Secondary | ICD-10-CM

## 2016-10-12 ENCOUNTER — Other Ambulatory Visit (HOSPITAL_COMMUNITY): Payer: Self-pay | Admitting: *Deleted

## 2016-10-12 MED ORDER — WARFARIN SODIUM 4 MG PO TABS: 4.0000 mg | ORAL_TABLET | Freq: Every day | ORAL | 12 refills | Status: DC

## 2016-10-14 ENCOUNTER — Other Ambulatory Visit (HOSPITAL_COMMUNITY): Payer: Self-pay | Admitting: *Deleted

## 2016-10-14 DIAGNOSIS — Z7901 Long term (current) use of anticoagulants: Secondary | ICD-10-CM

## 2016-10-14 DIAGNOSIS — Z95811 Presence of heart assist device: Secondary | ICD-10-CM

## 2016-10-15 ENCOUNTER — Ambulatory Visit (HOSPITAL_COMMUNITY): Payer: Self-pay | Admitting: Pharmacist

## 2016-10-15 ENCOUNTER — Ambulatory Visit (HOSPITAL_COMMUNITY)
Admission: RE | Admit: 2016-10-15 | Discharge: 2016-10-15 | Disposition: A | Discharge status: Home / Self Care | Payer: 59 | Source: Ambulatory Visit | Attending: Cardiology | Admitting: Cardiology

## 2016-10-15 ENCOUNTER — Other Ambulatory Visit (HOSPITAL_COMMUNITY): Payer: Self-pay | Admitting: *Deleted

## 2016-10-15 VITALS — BP 122/0 | HR 80 | Ht 67.0 in | Wt 202.6 lb

## 2016-10-15 DIAGNOSIS — Z79899 Other long term (current) drug therapy: Secondary | ICD-10-CM

## 2016-10-15 DIAGNOSIS — I5022 Chronic systolic (congestive) heart failure: Secondary | ICD-10-CM

## 2016-10-15 DIAGNOSIS — Z95811 Presence of heart assist device: Secondary | ICD-10-CM

## 2016-10-15 DIAGNOSIS — I10 Essential (primary) hypertension: Secondary | ICD-10-CM

## 2016-10-15 DIAGNOSIS — Z7901 Long term (current) use of anticoagulants: Secondary | ICD-10-CM

## 2016-10-15 DIAGNOSIS — Z4800 Encounter for change or removal of nonsurgical wound dressing: Secondary | ICD-10-CM | POA: Insufficient documentation

## 2016-10-15 LAB — BASIC METABOLIC PANEL
Anion gap: 4 — ABNORMAL LOW (ref 5–15)
BUN: 12 mg/dL (ref 6–20)
CALCIUM: 8.5 mg/dL — AB (ref 8.9–10.3)
CO2: 26 mmol/L (ref 22–32)
Chloride: 108 mmol/L (ref 101–111)
Creatinine, Ser: 1.38 mg/dL — ABNORMAL HIGH (ref 0.61–1.24)
GFR calc Af Amer: >60 mL/min (ref >60)
GFR, EST NON AFRICAN AMERICAN: 59 mL/min — AB (ref >60)
GLUCOSE: 74 mg/dL (ref 65–99)
Potassium: 3.8 mmol/L (ref 3.5–5.1)
Sodium: 138 mmol/L (ref 135–145)

## 2016-10-15 LAB — PROTIME-INR
INR: 2.13
PROTHROMBIN TIME: 24.2 s — AB (ref 11.4–15.2)

## 2016-10-15 MED ORDER — AMLODIPINE BESYLATE 5 MG PO TABS: 10.0000 mg | ORAL_TABLET | Freq: Every day | ORAL | 3 refills | Status: DC

## 2016-10-15 NOTE — Patient Instructions (Signed)
1.  Increase Amlodipine (Norvasc) to 10 mg daily. 2.  Return to [facility] in one week for dressing change and BP check.  3.  Dr. Haroldine Laws will call you re: listing status at [facility].

## 2016-10-15 NOTE — Progress Notes (Signed)
Patient presents to clinic today for drive line exit wound care, BP check and labs. . Existing VAD dressing removed and site care performed using sterile technique. Drive line exit site cleaned with Chlora prep applicators x 2, allowed to dry, and Sorbaview dressing with bio patch re-applied. Exit site healed and incorporated, the velour is fully implanted at exit site. No redness, tenderness, drainage, foul odor or rash noted. Drive line anchor re-applied. Pt denies fever or chills.   Return in 1 week for another dressing change per standard of care. INR to be repeated in 1-2 weeks pending results per anticoagulation protocol.   Wt: 202.6 lbs Ht:  5'7" Doppler BP:  122 Auto cuff BP:  126/73 (108) HR:  80 Sat:  99  BP reviewed with Darrick Grinder, NP - instructed pt to increase Norvasc to 10 mg daily.  Pt was contacted by [facility] last week re: listing status. Pt has multiple questions, Dr. Haroldine Laws contacted, he will call patient later to discuss his options re: heart transplant.   Patient Instructions: 1.  Increase Amlodipine (Norvasc) to 10 mg daily. 2.  Return to [place] clinic in one week for dressing change and BP check.  3.  Dr. Haroldine Laws will call you re: listing status at [facility].  Zada Girt RN, VAD Coordinator 24/7 pager [phone]

## 2016-10-19 ENCOUNTER — Telehealth (HOSPITAL_COMMUNITY): Payer: Self-pay | Admitting: *Deleted

## 2016-10-19 NOTE — Telephone Encounter (Signed)
Called by patient reporting increased lower extremity swelling. Instructed to take double diuretic today and call if swelling persists. Discussed with Dr Haroldine Laws.  Balinda Quails RN, VAD Coordinator 24/7 pager [phone]

## 2016-10-20 ENCOUNTER — Encounter (HOSPITAL_COMMUNITY): Payer: Self-pay | Admitting: Unknown Physician Specialty

## 2016-10-20 NOTE — Progress Notes (Signed)
Transplant packet sent to [facility]

## 2016-10-22 ENCOUNTER — Other Ambulatory Visit (HOSPITAL_COMMUNITY): Payer: Self-pay | Admitting: Unknown Physician Specialty

## 2016-10-22 DIAGNOSIS — Z7901 Long term (current) use of anticoagulants: Secondary | ICD-10-CM

## 2016-10-22 DIAGNOSIS — Z95811 Presence of heart assist device: Secondary | ICD-10-CM

## 2016-10-23 ENCOUNTER — Ambulatory Visit (HOSPITAL_COMMUNITY)
Admission: RE | Admit: 2016-10-23 | Discharge: 2016-10-23 | Disposition: A | Discharge status: Home / Self Care | Payer: 59 | Source: Ambulatory Visit | Attending: Cardiology | Admitting: Cardiology

## 2016-10-23 DIAGNOSIS — Z95811 Presence of heart assist device: Secondary | ICD-10-CM | POA: Diagnosis present

## 2016-10-23 DIAGNOSIS — Z7901 Long term (current) use of anticoagulants: Secondary | ICD-10-CM | POA: Insufficient documentation

## 2016-10-23 LAB — PROTIME-INR
INR: 2
PROTHROMBIN TIME: 22.9 s — AB (ref 11.4–15.2)

## 2016-10-23 MED ORDER — SPIRONOLACTONE 25 MG PO TABS: 12.5000 mg | ORAL_TABLET | Freq: Every day | ORAL | 5 refills | Status: DC

## 2016-10-23 NOTE — Progress Notes (Signed)
Patient presents to clinic today for drive line exit wound care, BP check and labs. . Existing VAD dressing removed and site care performed using sterile technique. Drive line exit site cleaned with Chlora prep applicators x 2, allowed to dry, and Sorbaview dressing with bio patch re-applied. Exit site healed and incorporated, the velour is fully implanted at exit site. No redness, tenderness, drainage, foul odor or rash noted. Drive line anchor re-applied. Pt denies fever or chills.   Return in 1 week for another dressing change per standard of care. INR to be repeated in 1-2 weeks pending results per anticoagulation protocol.   Wt: 202.6 lbs Ht:  5'7" Doppler BP:  92 Auto cuff BP:  108/67 (91) HR:  78 Sat:  98  Dr. Haroldine Laws spoke with pt about transplant. Packet already faxed to [facility]   Patient Instructions: 1.  Stop Norvasc. 2.  Start Spiro 12.5 daily. 3. Return to clinic in 1 week for dressing change and BMET.   Tanda Rockers RN, VAD Coordinator 24/7 pager [phone]

## 2016-10-24 ENCOUNTER — Ambulatory Visit (HOSPITAL_COMMUNITY): Payer: Self-pay | Admitting: Pharmacist

## 2016-10-29 ENCOUNTER — Ambulatory Visit (HOSPITAL_COMMUNITY)
Admission: RE | Admit: 2016-10-29 | Discharge: 2016-10-29 | Disposition: A | Discharge status: Home / Self Care | Payer: 59 | Source: Ambulatory Visit | Attending: Internal Medicine | Admitting: Internal Medicine

## 2016-10-29 ENCOUNTER — Ambulatory Visit (HOSPITAL_COMMUNITY): Payer: Self-pay | Admitting: Pharmacist

## 2016-10-29 ENCOUNTER — Other Ambulatory Visit (HOSPITAL_COMMUNITY): Payer: Self-pay | Admitting: *Deleted

## 2016-10-29 DIAGNOSIS — Z95811 Presence of heart assist device: Secondary | ICD-10-CM

## 2016-10-29 DIAGNOSIS — Z7901 Long term (current) use of anticoagulants: Secondary | ICD-10-CM | POA: Diagnosis present

## 2016-10-29 DIAGNOSIS — Z79899 Other long term (current) drug therapy: Secondary | ICD-10-CM | POA: Insufficient documentation

## 2016-10-29 LAB — BASIC METABOLIC PANEL
ANION GAP: 6 (ref 5–15)
BUN: 18 mg/dL (ref 6–20)
CHLORIDE: 110 mmol/L (ref 101–111)
CO2: 23 mmol/L (ref 22–32)
CREATININE: 1.43 mg/dL — AB (ref 0.61–1.24)
Calcium: 8.9 mg/dL (ref 8.9–10.3)
GFR calc Af Amer: >60 mL/min (ref >60)
GFR calc non Af Amer: 57 mL/min — ABNORMAL LOW (ref >60)
Glucose, Bld: 88 mg/dL (ref 65–99)
Potassium: 4.2 mmol/L (ref 3.5–5.1)
SODIUM: 139 mmol/L (ref 135–145)

## 2016-10-29 LAB — PROTIME-INR
INR: 1.87
PROTHROMBIN TIME: 21.8 s — AB (ref 11.4–15.2)

## 2016-10-30 ENCOUNTER — Telehealth: Payer: Self-pay | Admitting: Unknown Physician Specialty

## 2016-10-31 NOTE — Telephone Encounter (Signed)
Opened in error

## 2016-11-03 ENCOUNTER — Encounter: Payer: Self-pay | Admitting: Hematology

## 2016-11-03 ENCOUNTER — Telehealth: Payer: Self-pay | Admitting: Hematology

## 2016-11-03 ENCOUNTER — Other Ambulatory Visit (HOSPITAL_BASED_OUTPATIENT_CLINIC_OR_DEPARTMENT_OTHER): Payer: 59

## 2016-11-03 ENCOUNTER — Ambulatory Visit (HOSPITAL_BASED_OUTPATIENT_CLINIC_OR_DEPARTMENT_OTHER): Payer: 59 | Admitting: Hematology

## 2016-11-03 VITALS — BP 118/103 | HR 90 | Temp 97.8°F | Resp 18 | Ht 67.0 in | Wt 196.7 lb

## 2016-11-03 DIAGNOSIS — D72819 Decreased white blood cell count, unspecified: Secondary | ICD-10-CM

## 2016-11-03 DIAGNOSIS — D649 Anemia, unspecified: Secondary | ICD-10-CM

## 2016-11-03 DIAGNOSIS — D696 Thrombocytopenia, unspecified: Secondary | ICD-10-CM

## 2016-11-03 LAB — COMPREHENSIVE METABOLIC PANEL
ALT: 21 U/L (ref 0–55)
ANION GAP: 13 meq/L — AB (ref 3–11)
AST: 26 U/L (ref 5–34)
Albumin: 3.9 g/dL (ref 3.5–5.0)
Alkaline Phosphatase: 136 U/L (ref 40–150)
BILIRUBIN TOTAL: 0.92 mg/dL (ref 0.20–1.20)
BUN: 17.9 mg/dL (ref 7.0–26.0)
CALCIUM: 9.5 mg/dL (ref 8.4–10.4)
CHLORIDE: 108 meq/L (ref 98–109)
CO2: 20 meq/L — AB (ref 22–29)
CREATININE: 1.5 mg/dL — AB (ref 0.7–1.3)
EGFR: 64 mL/min/{1.73_m2} — ABNORMAL LOW (ref >90)
Glucose: 77 mg/dl (ref 70–140)
Potassium: 4.5 mEq/L (ref 3.5–5.1)
Sodium: 141 mEq/L (ref 136–145)
TOTAL PROTEIN: 8.9 g/dL — AB (ref 6.4–8.3)

## 2016-11-03 LAB — CBC & DIFF AND RETIC
BASO%: 1.2 % (ref 0.0–2.0)
BASOS ABS: 0 10*3/uL (ref 0.0–0.1)
EOS ABS: 0.1 10*3/uL (ref 0.0–0.5)
EOS%: 1.9 % (ref 0.0–7.0)
HCT: 43.8 % (ref 38.4–49.9)
HEMOGLOBIN: 14 g/dL (ref 13.0–17.1)
IMMATURE RETIC FRACT: 4.4 % (ref 3.00–10.60)
LYMPH%: 39.5 % (ref 14.0–49.0)
MCH: 22.7 pg — ABNORMAL LOW (ref 27.2–33.4)
MCHC: 32 g/dL (ref 32.0–36.0)
MCV: 71.1 fL — AB (ref 79.3–98.0)
MONO#: 0.3 10*3/uL (ref 0.1–0.9)
MONO%: 7.7 % (ref 0.0–14.0)
NEUT#: 1.6 10*3/uL (ref 1.5–6.5)
NEUT%: 49.7 % (ref 39.0–75.0)
PLATELETS: 148 10*3/uL (ref 140–400)
RBC: 6.16 10*6/uL — ABNORMAL HIGH (ref 4.20–5.82)
RDW: 17.1 % — ABNORMAL HIGH (ref 11.0–14.6)
RETIC CT ABS: 50.51 10*3/uL (ref 34.80–93.90)
Retic %: 0.82 % (ref 0.80–1.80)
WBC: 3.2 10*3/uL — ABNORMAL LOW (ref 4.0–10.3)
lymph#: 1.3 10*3/uL (ref 0.9–3.3)
nRBC: 0 % (ref 0–0)

## 2016-11-03 LAB — FERRITIN: Ferritin: 96 ng/ml (ref 22–316)

## 2016-11-03 LAB — IRON AND TIBC
%SAT: 30 % (ref 20–55)
IRON: 98 ug/dL (ref 42–163)
TIBC: 333 ug/dL (ref 202–409)
UIBC: 234 ug/dL (ref 117–376)

## 2016-11-03 LAB — CHCC SMEAR

## 2016-11-03 NOTE — Progress Notes (Signed)
Marland Kitchen    HEMATOLOGY/ONCOLOGY CONSULTATION NOTE  Date of Service: 11/03/2016  Patient Care Team: Sandi Mariscal, MD as PCP - General (Internal Medicine)  CHIEF COMPLAINTS/PURPOSE OF CONSULTATION:  Neutropenia  HISTORY OF PRESENTING ILLNESS:   [name] is a wonderful 46 y.o. male who has been referred to [place] by Dr .Sandi Mariscal, MD  for evaluation and management of neutropenia.  Patient has a history of multiple medical comorbidities including hypertension, dyslipidemia, chronic systolic CHF status post LVAD, gout on allopurinol, elevated LFTs, atrial fibrillation-paroxysmal-on anticoagulation with warfarin, history of ventricular fibrillation, chronic kidney disease and rheumatoid arthritis.  Patient had labs with his primary care physician that showed mild leukopenia with gradually decreasing WBC count. He was noted to have a WBC count of 4.9k on 08/01/2016 --> 3.7k on 10/06/2016 and down to 3.2k on labs today 11/03/2016.  Patient's labs today show a normal hemoglobin of 14 (up from 9.6 in 4/18) with an MCV of 71 and normal platelet count of 148k (up from 126k in 4/18).  He is in the process of being evaluated for cardiac transplantation at [facility].  Labs today his WBC count is 3.2k with a normal ANC of 1600. No issues with infections. Is on multiple medications which could potentially do this. No recent overt viral illness. No new bone pains fevers or chills. Has rheumatoid arthritis which she notes has been stable.   MEDICAL HISTORY:  Past Medical History:  Diagnosis Date  . AICD (automatic cardioverter/defibrillator) present   . AKI (acute kidney injury) ([place])   . Atrial fibrillation -parosysmal     Rx w amiodarone  . Automatic implantable cardiac defibrillator -BSX    single chamber  . CHF (congestive heart failure) ([place])   . Chronic systolic heart failure (HCC)    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%)  . Elevated LFTs   . GI bleed -massive    11 Units 2012  . Gout   .  H/O hyperthyroidism   . Hypertension   . Noncompliance    H/O  MEDICAL NONCOMPLIANCE  . 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 Polymorphic ventricular tachycardia (HCC)    RECURRENT WITH APPROPRIATE SHOCK THERAPY IN THE PAST  . RA (rheumatoid arthritis) ([place])   . Severe mitral regurgitation   . Tricuspid valve regurgitation    SEVERE  . Ventricular fibrillation ([place])    WITH APPROPRIATE SHOCK THERAPY IN THE PAST    SURGICAL HISTORY: Past Surgical History:  Procedure Laterality Date  . APPLICATION OF A-CELL OF EXTREMITY N/A 09/05/2015   Procedure: APPLICATION OF A-CELL OF sternum;  Surgeon: Loel Lofty Dillingham, DO;  Location: [place];  Service: Plastics;  Laterality: N/A;  . APPLICATION OF WOUND VAC N/A 08/27/2015   Procedure: APPLICATION OF WOUND VAC;  Surgeon: Ivin Poot, MD;  Location: [place];  Service: Thoracic;  Laterality: N/A;  . APPLICATION OF WOUND VAC N/A 08/29/2015   Procedure: WOUND VAC CHANGE;  Surgeon: Ivin Poot, MD;  Location: [place];  Service: Thoracic;  Laterality: N/A;  . APPLICATION OF WOUND VAC N/A 09/05/2015   Procedure: APPLICATION OF WOUND VAC to sternum;  Surgeon: Loel Lofty Dillingham, DO;  Location: [place];  Service: Plastics;  Laterality: N/A;  . CARDIAC CATHETERIZATION  06/2006   RIGHT HEART CATH SHOWING SEVERE BIVENTRICUALR CHF WITH MARKED FILLING AND PRESSURES  . CARDIAC CATHETERIZATION N/A 12/14/2014   Procedure: Right Heart Cath;  Surgeon: Jolaine Artist, MD;  Location: [place] CV LAB;  Service: Cardiovascular;  Laterality: N/A;  . CARDIAC CATHETERIZATION N/A 12/14/2014   Procedure: IABP Insertion;  Surgeon: Jolaine Artist, MD;  Location: [place] CV LAB;  Service: Cardiovascular;  Laterality: N/A;  . CARDIAC CATHETERIZATION N/A 12/18/2015   Procedure: Right Heart Cath;  Surgeon: Jolaine Artist, MD;  Location: [place] CV LAB;  Service: Cardiovascular;  Laterality: N/A;  . CHOLECYSTECTOMY    .  ESOPHAGOGASTRODUODENOSCOPY N/A 09/17/2015   Procedure: ESOPHAGOGASTRODUODENOSCOPY (EGD);  Surgeon: Carol Ada, MD;  Location: [place];  Service: Gastroenterology;  Laterality: N/A;  . IMPLANTABLE CARDIOVERTER DEFIBRILLATOR GENERATOR CHANGE N/A 07/01/2011   Procedure: IMPLANTABLE CARDIOVERTER DEFIBRILLATOR GENERATOR CHANGE;  Surgeon: Deboraha Sprang, MD;  Location: [facility] CATH LAB;  Service: Cardiovascular;  Laterality: N/A;  . INCISION AND DRAINAGE OF WOUND N/A 09/05/2015   Procedure: IRRIGATION AND DEBRIDEMENT sternal WOUND;  Surgeon: Loel Lofty Dillingham, DO;  Location: [place];  Service: Plastics;  Laterality: N/A;  . INSERT / REPLACE / REMOVE PACEMAKER     GUIDANT HE ICD MODEL 2180, SERIAL # [id number]  . INSERTION OF DIALYSIS CATHETER Right 09/05/2015   Procedure: INSERTION OF DIALYSIS CATHETER;RIGHT SUBCLAVIAN;  Surgeon: Loel Lofty Dillingham, DO;  Location: [place];  Service: Plastics;  Laterality: Right;  . INSERTION OF IMPLANTABLE LEFT VENTRICULAR ASSIST DEVICE N/A 12/20/2014   Procedure: INSERTION OF IMPLANTABLE LEFT VENTRICULAR ASSIST DEVICE;  Surgeon: Ivin Poot, MD;  Location: [place];  Service: Open Heart Surgery;  Laterality: N/A;  CIRC ARREST  NITRIC OXIDE  . PECTORALIS FLAP N/A 09/09/2015   Procedure: DEBRIDEMENT AND CLOSURE WOUND WITH PLACEMENT OF ABRA CLOSURE DEVICE;  Surgeon: Loel Lofty Dillingham, DO;  Location: [place];  Service: Plastics;  Laterality: N/A;  . STERNAL CLOSURE N/A 09/17/2015   Procedure: ABDOMINAL WOUND CLOSURE WITH INCISIONAL VAC APPLICATION;  Surgeon: Ivin Poot, MD;  Location: [place];  Service: Thoracic;  Laterality: N/A;  . STERNAL WOUND DEBRIDEMENT N/A 08/27/2015   Procedure: WOUND IRRIGATION AND DEBRIDEMENT;  Surgeon: Ivin Poot, MD;  Location: [place];  Service: Thoracic;  Laterality: N/A;  . STERNAL WOUND DEBRIDEMENT N/A 08/29/2015   Procedure: DEBRIDEMENT OF chest wound;  Surgeon: Ivin Poot, MD;  Location: [place];  Service: Thoracic;  Laterality: N/A;  . TEE  WITHOUT CARDIOVERSION N/A 12/12/2014   Procedure: TRANSESOPHAGEAL ECHOCARDIOGRAM (TEE);  Surgeon: Jerline Pain, MD;  Location: [place];  Service: Cardiovascular;  Laterality: N/A;  . TEE WITHOUT CARDIOVERSION N/A 12/20/2014   Procedure: TRANSESOPHAGEAL ECHOCARDIOGRAM (TEE);  Surgeon: Ivin Poot, MD;  Location: [place];  Service: Open Heart Surgery;  Laterality: N/A;    SOCIAL HISTORY: Social History   Social History  . Marital status: Single    Spouse name: N/A  . Number of children: N/A  . Years of education: N/A   Occupational History  . [employer]   Social History Main Topics  . Smoking status: Never Smoker  . Smokeless tobacco: Never Used  . Alcohol use No  . Drug use: No  . Sexual activity: Not on file   Other Topics Concern  . Not on file   Social History Narrative   LIVES IN [place]   SINGLE   TOBACCO USE .Marland KitchenMarland KitchenNO   ALCOHOL USE.Marland Kitchen NO   REGULAR EXERCISE.Marland Kitchen NO   DRUG USE... NO    FAMILY HISTORY: Family History  Problem Relation Age of Onset  . Heart failure Brother     ALLERGIES:  is  allergic to phytonadione.  MEDICATIONS:  Current Outpatient Prescriptions  Medication Sig Dispense Refill  . allopurinol (ZYLOPRIM) 100 MG tablet Take 1 tablet (100 mg total) by mouth daily. 30 tablet 6  . amoxicillin (AMOXIL) 500 MG capsule Take 4 capsules (2,000 mg total) by mouth as directed. 1 hour prior to dental appointment 4 capsule 1  . citalopram (CELEXA) 20 MG tablet take 1 tablet by mouth once daily 30 tablet 6  . ferrous sulfate 324 (65 Fe) MG TBEC Take 1 tablet (325 mg total) by mouth 2 (two) times daily. 60 tablet 5  . furosemide (LASIX) 40 MG tablet Take 1 tablet (40 mg total) by mouth daily as needed (weight gain or swelling). 30 tablet 6  . potassium chloride SA (K-DUR,KLOR-CON) 20 MEQ tablet Take 20 mEq by mouth as needed (when take furosemide).    . pregabalin (LYRICA) 75 MG capsule Take 1 capsule (75 mg total) by mouth 2 (two) times  daily. (Patient taking differently: Take 75 mg by mouth daily. ) 60 capsule 5  . sacubitril-valsartan (ENTRESTO) 97-103 MG Take 1 tablet by mouth 2 (two) times daily. 60 tablet 11  . sildenafil (REVATIO) 20 MG tablet Take 20 mg by mouth 3 (three) times daily.    Marland Kitchen spironolactone (ALDACTONE) 25 MG tablet Take 0.5 tablets (12.5 mg total) by mouth daily. 15 tablet 5  . tamsulosin (FLOMAX) 0.4 MG CAPS capsule Take 0.4 mg by mouth daily.    Marland Kitchen warfarin (COUMADIN) 4 MG tablet Take 4 mg (1 tablet) daily except 6 mg (1 and 1/2 tablets) on Tues, Thurs, Sat 34 tablet 11  . warfarin (COUMADIN) 4 MG tablet Take 1-1.5 tablets (4-6 mg total) by mouth daily. Take 4 mg (1 tablet) daily except 6 mg (1 and 1/2 tablet) on Tuesday 45 tablet 12  . zolpidem (AMBIEN) 10 MG tablet Take 10 mg by mouth at bedtime as needed for sleep.     No current facility-administered medications for this visit.     REVIEW OF SYSTEMS:    10 Point review of Systems was done is negative except as noted above.  PHYSICAL EXAMINATION: ECOG PERFORMANCE STATUS: 2 - Symptomatic, <50% confined to bed  . Vitals:   11/03/16 1120  BP: (!) 118/103  Pulse: 90  Resp: 18  Temp: 97.8 F (36.6 C)   Filed Weights   11/03/16 1120  Weight: 196 lb 11.2 oz (89.2 kg)   .Body mass index is 30.81 kg/m.  GENERAL:alert, in no acute distress and comfortable SKIN: no acute rashes, no significant lesions EYES: conjunctiva are pink and non-injected, sclera anicteric OROPHARYNX: MMM, no exudates, no oropharyngeal erythema or ulceration NECK: supple, no JVD LYMPH:  no palpable lymphadenopathy in the cervical, axillary or inguinal regions LUNGS: clear to auscultation b/l with normal respiratory effort HEART:irreg ABDOMEN:  normoactive bowel sounds , non tender, not distended. Extremity: no pedal edema PSYCH: alert & oriented x 3 with fluent speech NEURO: no focal motor/sensory deficits  LABORATORY DATA:  I have reviewed the data as  listed  . CBC Latest Ref Rng & Units 11/03/2016 11/03/2016 10/06/2016  WBC 4.0 - 10.3 10e3/uL 3.2(L) - 3.7(L)  Hemoglobin 13.0 - 17.1 g/dL 14.0 - 12.9(L)  Hematocrit 37.5 - 51.0 % 43.8 45.2 40.2  Platelets 140 - 400 10e3/uL 148 - 135(L)   . CBC    Component Value Date/Time   WBC 3.2 (L) 11/03/2016 1253   WBC 3.7 (L) 10/06/2016 1146   RBC 6.16 (H) 11/03/2016 1253  RBC 5.52 10/06/2016 1146   HGB 14.0 11/03/2016 1253   HCT 43.8 11/03/2016 1253   HCT 45.2 11/03/2016 1253   PLT 148 11/03/2016 1253   MCV 71.1 (L) 11/03/2016 1253   MCH 22.7 (L) 11/03/2016 1253   MCH 23.4 (L) 10/06/2016 1146   MCHC 32.0 11/03/2016 1253   MCHC 32.1 10/06/2016 1146   RDW 17.1 (H) 11/03/2016 1253   LYMPHSABS 1.3 11/03/2016 1253   MONOABS 0.3 11/03/2016 1253   EOSABS 0.1 11/03/2016 1253   BASOSABS 0.0 11/03/2016 1253    . CMP Latest Ref Rng & Units 10/29/2016 10/15/2016 10/06/2016  Glucose 65 - 99 mg/dL 88 74 89  BUN 6 - 20 mg/dL 18 12 17   Creatinine 0.61 - 1.24 mg/dL 1.43(H) 1.38(H) 1.61(H)  Sodium 135 - 145 mmol/L 139 138 136  Potassium 3.5 - 5.1 mmol/L 4.2 3.8 3.6  Chloride 101 - 111 mmol/L 110 108 108  CO2 22 - 32 mmol/L 23 26 21(L)  Calcium 8.9 - 10.3 mg/dL 8.9 8.5(L) 8.7(L)  Total Protein 6.5 - 8.1 g/dL - - -  Total Bilirubin 0.3 - 1.2 mg/dL - - -  Alkaline Phos 38 - 126 U/L - - -  AST 15 - 41 U/L - - -  ALT 17 - 63 U/L - - -   Component     Latest Ref Rng & Units 11/03/2016  Iron     42 - 163 ug/dL 98  TIBC     202 - 409 ug/dL 333  UIBC     117 - 376 ug/dL 234  %SAT     20 - 55 % 30  Folate, Hemolysate     Not Estab. ng/mL 327.8  HCT     37.5 - 51.0 % 45.2  Folate, RBC     >498 ng/mL 725  Vitamin B12     232 - 1,245 pg/mL 462  Ferritin     22 - 316 ng/ml 96  Hep C Virus Ab     0.0 - 0.9 s/co ratio <0.1    RADIOGRAPHIC STUDIES: I have personally reviewed the radiological images as listed and agreed with the findings in the report. No results found.  ASSESSMENT & PLAN:    46 yo male with   1) Mild Leukopenia -  Unclear etiology Hep C neg B21/RBC folate WNL Likely related to medications - ACEI/lasix etc which was essential at this time. Cannot r/o autoimmune etiology in conjunction with his RA Plan -lbas reviewed -no issues with infections from leucopenia. No neutropenia this period. -no indication for G-CSF -patients cardiac health has been his primary concern currently and given significant co-morbidity we discussed and decided to hold off on  BM Bx at thsi time.  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 Lab Results  Component Value Date   IRON 98 11/03/2016   TIBC 333 11/03/2016   IRONPCTSAT 30 11/03/2016   (Iron and TIBC)  Lab Results  Component Value Date   FERRITIN 96 11/03/2016    3) Thrombocytopenia -- nearly resolved.  Labs today RTC with Dr Irene Limbo in 4 months with labs  All of the patients questions were answered with apparent satisfaction. The patient knows to call the clinic with any problems, questions or concerns.  I spent 40 minutes counseling the patient face to face. The total time spent in the appointment was 60 minutes and more than 50% was on counseling and direct patient  cares.    Sullivan Lone MD [place] AAHIVMS [facility] Hematology/Oncology Physician [facility]  (Office):       [phone] (Work cell):  [phone] (Fax):           [phone]  11/03/2016 12:10 PM

## 2016-11-03 NOTE — Telephone Encounter (Signed)
Gv pt appt for 03/02/17. Back to lab today.

## 2016-11-03 NOTE — Patient Instructions (Signed)
Thank you for choosing [facility] to provide your oncology and hematology care.  To afford each patient quality time with our providers, please arrive 30 minutes before your scheduled appointment time.  If you arrive late for your appointment, you may be asked to reschedule.  We strive to give you quality time with our providers, and arriving late affects you and other patients whose appointments are after yours.   If you are a no show for multiple scheduled visits, you may be dismissed from the clinic at the providers discretion.    Again, thank you for choosing [facility], our hope is that these requests will decrease the amount of time that you wait before being seen by our physicians.  ______________________________________________________________________  Should you have questions after your visit to the [facility], please contact our office at [phone] between the hours of 8:30 and 4:30 p.m.    Voicemails left after 4:30p.m will not be returned until the following business day.    For prescription refill requests, please have your pharmacy contact us directly.  Please also try to allow 48 hours for prescription requests.    Please contact the scheduling department for questions regarding scheduling.  For scheduling of procedures such as PET scans, CT scans, MRI, Ultrasound, etc please contact central scheduling at [phone].    Resources For Cancer Patients and Caregivers:   Oncolink.org:  A wonderful resource for patients and healthcare providers for information regarding your disease, ways to tract your treatment, what to expect, etc.     American Cancer Society:  800-227-2345  Can help patients locate various types of support and financial assistance  Cancer Care: 1-800-813-HOPE (4673) Provides financial assistance, online support groups, medication/co-pay assistance.    [place] DSS:  336-641-3447 Where to apply for food  stamps, Medicaid, and utility assistance  Medicare Rights Center: 800-333-4114 Helps people with Medicare understand their rights and benefits, navigate the Medicare system, and secure the quality healthcare they deserve  SCAT: 336-333-6589 [place] [employer]'s shared-ride transportation service for eligible riders who have a disability that prevents them from riding the fixed route bus.    For additional information on assistance programs please contact our social worker:   Grier Hock/Abigail Elmore:  [phone]            

## 2016-11-04 LAB — FOLATE RBC
FOLATE, RBC: 725 ng/mL (ref >498)
Folate, Hemolysate: 327.8 ng/mL
HEMATOCRIT: 45.2 % (ref 37.5–51.0)

## 2016-11-04 LAB — VITAMIN B12: VITAMIN B 12: 462 pg/mL (ref 232–1245)

## 2016-11-04 LAB — HEPATITIS C ANTIBODY: Hep C Virus Ab: <0.1 s/co ratio (ref 0.0–0.9)

## 2016-11-05 ENCOUNTER — Ambulatory Visit (HOSPITAL_COMMUNITY): Payer: Self-pay | Admitting: Pharmacist

## 2016-11-05 ENCOUNTER — Other Ambulatory Visit (HOSPITAL_COMMUNITY): Payer: Self-pay | Admitting: Pharmacist

## 2016-11-05 ENCOUNTER — Other Ambulatory Visit (HOSPITAL_COMMUNITY): Payer: Self-pay | Admitting: Unknown Physician Specialty

## 2016-11-05 ENCOUNTER — Ambulatory Visit (HOSPITAL_COMMUNITY)
Admission: RE | Admit: 2016-11-05 | Discharge: 2016-11-05 | Disposition: A | Discharge status: Home / Self Care | Payer: 59 | Source: Ambulatory Visit | Attending: Cardiology | Admitting: Cardiology

## 2016-11-05 DIAGNOSIS — Z7901 Long term (current) use of anticoagulants: Secondary | ICD-10-CM | POA: Diagnosis not present

## 2016-11-05 DIAGNOSIS — Z95811 Presence of heart assist device: Secondary | ICD-10-CM

## 2016-11-05 LAB — PROTIME-INR
INR: 1.81
Prothrombin Time: 21.2 seconds — ABNORMAL HIGH (ref 11.4–15.2)

## 2016-11-05 MED ORDER — WARFARIN SODIUM 4 MG PO TABS: 4.0000 mg | ORAL_TABLET | Freq: Every day | ORAL | 12 refills | Status: DC

## 2016-11-05 NOTE — Progress Notes (Signed)
Patient presents to clinic today for drive line exit wound care, BP check and labs. . Existing VAD dressing removed and site care performed using sterile technique. Drive line exit site cleaned with Chlora prep applicators x 2, allowed to dry, and Sorbaview dressing with bio patch re-applied. Exit site healed and incorporated, the velour is fully implanted at exit site. No redness, tenderness, drainage, foul odor or rash noted. Drive line anchor re-applied. Pt denies fever or chills.   Return in 1 week for another dressing change per standard of care. INR to be repeated in 1-2 weeks pending results per anticoagulation protocol.    Patient Instructions: 1. Return to clinic in 1 week for dressing change.    Tanda Rockers RN, VAD Coordinator 24/7 pager [phone]

## 2016-11-09 ENCOUNTER — Other Ambulatory Visit (HOSPITAL_COMMUNITY): Payer: Self-pay | Admitting: Unknown Physician Specialty

## 2016-11-09 DIAGNOSIS — Z95811 Presence of heart assist device: Secondary | ICD-10-CM

## 2016-11-09 DIAGNOSIS — Z7901 Long term (current) use of anticoagulants: Secondary | ICD-10-CM

## 2016-11-11 ENCOUNTER — Other Ambulatory Visit (HOSPITAL_COMMUNITY): Payer: 59

## 2016-11-11 ENCOUNTER — Encounter: Payer: Self-pay | Admitting: Internal Medicine

## 2016-11-11 ENCOUNTER — Ambulatory Visit (INDEPENDENT_AMBULATORY_CARE_PROVIDER_SITE_OTHER): Payer: 59 | Admitting: Internal Medicine

## 2016-11-11 VITALS — HR 74 | Ht 67.0 in | Wt 195.0 lb

## 2016-11-11 DIAGNOSIS — I472 Ventricular tachycardia, unspecified: Secondary | ICD-10-CM

## 2016-11-11 DIAGNOSIS — Z95811 Presence of heart assist device: Secondary | ICD-10-CM | POA: Diagnosis not present

## 2016-11-11 DIAGNOSIS — I428 Other cardiomyopathies: Secondary | ICD-10-CM | POA: Diagnosis not present

## 2016-11-11 DIAGNOSIS — M1000 Idiopathic gout, unspecified site: Secondary | ICD-10-CM

## 2016-11-11 DIAGNOSIS — Z95810 Presence of automatic (implantable) cardiac defibrillator: Secondary | ICD-10-CM

## 2016-11-11 DIAGNOSIS — I5022 Chronic systolic (congestive) heart failure: Secondary | ICD-10-CM | POA: Diagnosis not present

## 2016-11-11 DIAGNOSIS — I480 Paroxysmal atrial fibrillation: Secondary | ICD-10-CM | POA: Diagnosis not present

## 2016-11-11 NOTE — Patient Instructions (Addendum)
Medication Instructions: - Your physician recommends that you continue on your current medications as directed. Please refer to the Current Medication list given to you today.  Labwork: - none ordered  Procedures/Testing: - none ordered  Follow-Up: - Your physician recommends that you schedule a follow-up appointment in: 3 months with the [facility] wants you to follow-up in: 1 year with Dr. Caryl Comes. You will receive a reminder letter in the mail two months in advance. If you don't receive a letter, please call our office to schedule the follow-up appointment.   Any Additional Special Instructions Will Be Listed Below (If Applicable).     If you need a refill on your cardiac medications before your next appointment, please call your pharmacy.

## 2016-11-12 ENCOUNTER — Ambulatory Visit (HOSPITAL_COMMUNITY): Payer: Self-pay | Admitting: Pharmacist

## 2016-11-12 ENCOUNTER — Ambulatory Visit (HOSPITAL_COMMUNITY)
Admission: RE | Admit: 2016-11-12 | Discharge: 2016-11-12 | Disposition: A | Discharge status: Home / Self Care | Payer: 59 | Source: Ambulatory Visit | Attending: Cardiology | Admitting: Cardiology

## 2016-11-12 DIAGNOSIS — Z7901 Long term (current) use of anticoagulants: Secondary | ICD-10-CM | POA: Insufficient documentation

## 2016-11-12 DIAGNOSIS — Z95811 Presence of heart assist device: Secondary | ICD-10-CM | POA: Insufficient documentation

## 2016-11-12 LAB — PROTIME-INR
INR: 2.01
PROTHROMBIN TIME: 23.1 s — AB (ref 11.4–15.2)

## 2016-11-12 NOTE — Progress Notes (Signed)
Pt presented early to clinic for LVAD controller advisory "Call [place]" that he noticed on his controller when he woke up this am. Denies any alarms. VAD interrogation revealed "contoller fault" started at @ midnight last night with advisory to "replace system controller". Performed self test on controller, advisory not cleared.  Event log downloaded; notified [employer] clinical rep, Bronson Curb. After reviewing controller face, noted to have only 1/2 circle of green lights which she says could indicate primary microprocessor fault and controller has switched to secondary microprocessor. Controller exchange recommended. Performed controller change out without any complications, pt tolerated procedure without issue.  Replaced controller:  [id number] New controller:  [id number]; back-up battery [id number]     Drive line exit wound care performed by Vilinda Blanks, RN today. Existing VAD dressing removed and site care performed using sterile technique. Drive line exit site cleaned with Chlora prep applicators x 2, allowed to dry, and Sorbaview dressing with bio patch re-applied. Exit site healed and incorporated, the velour is fully implanted at exit site. No redness, tenderness, drainage, foul odor or rash noted. Drive line anchor re-applied. Pt denies fever or chills.   Return in 1 week for another dressing change per standard of care.

## 2016-11-13 NOTE — Progress Notes (Signed)
Patient Care Team: Sandi Mariscal, MD as PCP - General (Internal Medicine)   HPI  [name] is a 46 y.o. 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  Rx with sidenafil and dig  On transplant list previously at [facility] now at [facility]   History of atrial fibrillation for which he previously took amiodarone;  He had elevated LFTs and amiodarone was discontinued.   Last echo 7/14 >> EF 25-30,  Mild MR, severe LAE, mild RAE, mild TR, Echo 9/16 EF 25%   7/14 reverted to atrial fib, NOAC resumed.  At anticipated DCCV pt had reverted to sinus  The patient denies chest pain, shortness of breath, nocturnal dyspnea, orthopnea or peripheral edema.  There have been no palpitations, lightheadedness or syncope.   He tells me I have paid my taxes on time.     Past Medical History:  Diagnosis Date  . AICD (automatic cardioverter/defibrillator) present   . AKI (acute kidney injury) ([place])   . Atrial fibrillation -parosysmal     Rx w amiodarone  . Automatic implantable cardiac defibrillator -BSX    single chamber  . CHF (congestive heart failure) ([place])   . Chronic systolic heart failure (HCC)    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%)  . Elevated LFTs   . GI bleed -massive    11 Units 2012  . Gout   . H/O hyperthyroidism   . Hypertension   . Noncompliance    H/O  MEDICAL NONCOMPLIANCE  . 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 Polymorphic ventricular tachycardia (HCC)    RECURRENT WITH APPROPRIATE SHOCK THERAPY IN THE PAST  . RA (rheumatoid arthritis) ([place])   . Severe mitral regurgitation   . Tricuspid valve regurgitation    SEVERE  . Ventricular fibrillation ([place])    WITH APPROPRIATE SHOCK THERAPY IN THE PAST    Past Surgical  History:  Procedure Laterality Date  . APPLICATION OF A-CELL OF EXTREMITY N/A 09/05/2015   Procedure: APPLICATION OF A-CELL OF sternum;  Surgeon: Loel Lofty Dillingham, DO;  Location: [place];  Service: Plastics;  Laterality: N/A;  . APPLICATION OF WOUND VAC N/A 08/27/2015   Procedure: APPLICATION OF WOUND VAC;  Surgeon: Ivin Poot, MD;  Location: [place];  Service: Thoracic;  Laterality: N/A;  . APPLICATION OF WOUND VAC N/A 08/29/2015   Procedure: WOUND VAC CHANGE;  Surgeon: Ivin Poot, MD;  Location: [place];  Service: Thoracic;  Laterality: N/A;  . APPLICATION OF WOUND VAC N/A 09/05/2015   Procedure: APPLICATION OF WOUND VAC to sternum;  Surgeon: Loel Lofty Dillingham, DO;  Location: [place];  Service: Plastics;  Laterality: N/A;  . CARDIAC CATHETERIZATION  06/2006   RIGHT HEART CATH SHOWING SEVERE BIVENTRICUALR CHF WITH MARKED FILLING AND PRESSURES  . CARDIAC CATHETERIZATION N/A 12/14/2014   Procedure: Right Heart Cath;  Surgeon: Jolaine Artist, MD;  Location: [place] CV LAB;  Service: Cardiovascular;  Laterality: N/A;  . CARDIAC CATHETERIZATION N/A 12/14/2014   Procedure: IABP Insertion;  Surgeon: Jolaine Artist, MD;  Location: [place] CV LAB;  Service: Cardiovascular;  Laterality: N/A;  . CARDIAC CATHETERIZATION N/A 12/18/2015   Procedure: Right Heart Cath;  Surgeon: Jolaine Artist, MD;  Location: [place] CV LAB;  Service: Cardiovascular;  Laterality: N/A;  . CHOLECYSTECTOMY    . ESOPHAGOGASTRODUODENOSCOPY N/A 09/17/2015   Procedure: ESOPHAGOGASTRODUODENOSCOPY (EGD);  Surgeon: Carol Ada, MD;  Location: [place];  Service: Gastroenterology;  Laterality: N/A;  . IMPLANTABLE CARDIOVERTER DEFIBRILLATOR GENERATOR CHANGE N/A 07/01/2011   Procedure: IMPLANTABLE CARDIOVERTER DEFIBRILLATOR GENERATOR CHANGE;  Surgeon: Deboraha Sprang, MD;  Location: [facility] CATH LAB;  Service: Cardiovascular;  Laterality: N/A;  . INCISION AND DRAINAGE OF WOUND N/A 09/05/2015   Procedure: IRRIGATION AND  DEBRIDEMENT sternal WOUND;  Surgeon: Loel Lofty Dillingham, DO;  Location: [place];  Service: Plastics;  Laterality: N/A;  . INSERT / REPLACE / REMOVE PACEMAKER     GUIDANT HE ICD MODEL 2180, SERIAL # [id number]  . INSERTION OF DIALYSIS CATHETER Right 09/05/2015   Procedure: INSERTION OF DIALYSIS CATHETER;RIGHT SUBCLAVIAN;  Surgeon: Loel Lofty Dillingham, DO;  Location: [place];  Service: Plastics;  Laterality: Right;  . INSERTION OF IMPLANTABLE LEFT VENTRICULAR ASSIST DEVICE N/A 12/20/2014   Procedure: INSERTION OF IMPLANTABLE LEFT VENTRICULAR ASSIST DEVICE;  Surgeon: Ivin Poot, MD;  Location: [place];  Service: Open Heart Surgery;  Laterality: N/A;  CIRC ARREST  NITRIC OXIDE  . PECTORALIS FLAP N/A 09/09/2015   Procedure: DEBRIDEMENT AND CLOSURE WOUND WITH PLACEMENT OF ABRA CLOSURE DEVICE;  Surgeon: Loel Lofty Dillingham, DO;  Location: [place];  Service: Plastics;  Laterality: N/A;  . STERNAL CLOSURE N/A 09/17/2015   Procedure: ABDOMINAL WOUND CLOSURE WITH INCISIONAL VAC APPLICATION;  Surgeon: Ivin Poot, MD;  Location: [place];  Service: Thoracic;  Laterality: N/A;  . STERNAL WOUND DEBRIDEMENT N/A 08/27/2015   Procedure: WOUND IRRIGATION AND DEBRIDEMENT;  Surgeon: Ivin Poot, MD;  Location: [place];  Service: Thoracic;  Laterality: N/A;  . STERNAL WOUND DEBRIDEMENT N/A 08/29/2015   Procedure: DEBRIDEMENT OF chest wound;  Surgeon: Ivin Poot, MD;  Location: [place];  Service: Thoracic;  Laterality: N/A;  . TEE WITHOUT CARDIOVERSION N/A 12/12/2014   Procedure: TRANSESOPHAGEAL ECHOCARDIOGRAM (TEE);  Surgeon: Jerline Pain, MD;  Location: [place];  Service: Cardiovascular;  Laterality: N/A;  . TEE WITHOUT CARDIOVERSION N/A 12/20/2014   Procedure: TRANSESOPHAGEAL ECHOCARDIOGRAM (TEE);  Surgeon: Ivin Poot, MD;  Location: [place];  Service: Open Heart Surgery;  Laterality: N/A;    Current Outpatient Prescriptions  Medication Sig Dispense Refill  . allopurinol (ZYLOPRIM) 100 MG tablet Take 1 tablet  (100 mg total) by mouth daily. 30 tablet 6  . amoxicillin (AMOXIL) 500 MG capsule Take 4 capsules (2,000 mg total) by mouth as directed. 1 hour prior to dental appointment 4 capsule 1  . citalopram (CELEXA) 20 MG tablet take 1 tablet by mouth once daily 30 tablet 6  . ferrous sulfate 324 (65 Fe) MG TBEC Take 1 tablet (325 mg total) by mouth 2 (two) times daily. 60 tablet 5  . furosemide (LASIX) 40 MG tablet Take 1 tablet (40 mg total) by mouth daily as needed (weight gain or swelling). 30 tablet 6  . pregabalin (LYRICA) 75 MG capsule Take 75 mg by mouth daily.    . sacubitril-valsartan (ENTRESTO) 97-103 MG Take 1 tablet by mouth 2 (two) times daily. 60 tablet 11  . sildenafil (REVATIO) 20 MG tablet Take 20 mg by mouth 3 (three) times daily.    Marland Kitchen spironolactone (ALDACTONE) 25 MG tablet Take 0.5 tablets (12.5 mg total) by mouth daily. 15 tablet 5  . tamsulosin (FLOMAX) 0.4 MG CAPS capsule Take 0.4 mg by mouth daily.    Marland Kitchen warfarin (COUMADIN) 4 MG tablet  Take 1-1.5 tablets (4-6 mg total) by mouth daily. Take 6 mg (1 and 1/2 tablets) daily except 4 mg (1 tablet) on Mon, Wed, Fri. 45 tablet 12  . zolpidem (AMBIEN) 10 MG tablet Take 10 mg by mouth at bedtime as needed for sleep.     No current facility-administered medications for this visit.     Allergies  Allergen Reactions  . Phytonadione Other (See Comments)    Patient has LVAD: please check with LVAD coordinator on call or LVAD MD on call before reversal of anticoagulation with vit k    Review of Systems negative except from HPI and PMH  Physical Exam Pulse 74   Ht 5\' 7"  (1.702 m)   Wt 195 lb (88.5 kg)   SpO2 98%   BMI 30.54 kg/m  Well developed and well nourished in no acute distress HENT normal E scleral and icterus clear Neck Supple JVP flat; carotids brisk and full Clear to ausculation Hum Device pocket well healed; without hematoma or erythema.  There is no tethering  Soft with active bowel sounds No clubbing cyanosis none  Edema Alert and oriented, grossly normal motor and sensory function Skin Warm and Dry  ECG demonstrates atrial fibrillation/flutter  Assessment and  Plan    1. Nonischemic cardiomyopathy.   Continue current meds  2. Atrial fibrillation No intercurrent Atrial fibrillation or flutter  3. Ventricular tachycardia: No intercurrent ventricular tachycardia  4. Chronic systolic heart failure: Euvolemic.  5. Implantable defibrillator-[employer]-single chamber:The patient's device was interrogated.  The information was reviewed. No changes were made in the programming.    6. LVAD in place    Little to add  Stable Euvolemic continue current meds  No intercurrent Ventricular tachycardia  Device function normal

## 2016-11-17 LAB — CUP PACEART INCLINIC DEVICE CHECK
HighPow Impedance: 43 Ohm
HighPow Impedance: 49 Ohm
Implantable Lead Model: 144
Implantable Lead Serial Number: 303019
Lead Channel Pacing Threshold Pulse Width: 0.4 ms
Lead Channel Setting Pacing Amplitude: 2.4 V
Lead Channel Setting Sensing Sensitivity: 0.5 mV
MDC IDC LEAD IMPLANT DT: 19990517
MDC IDC LEAD LOCATION: 753860
MDC IDC MSMT LEADCHNL RV IMPEDANCE VALUE: 710 Ohm
MDC IDC MSMT LEADCHNL RV PACING THRESHOLD AMPLITUDE: 1.3 V
MDC IDC MSMT LEADCHNL RV SENSING INTR AMPL: 12.8 mV
MDC IDC PG IMPLANT DT: 20130313
MDC IDC PG SERIAL: 100391
MDC IDC SESS DTM: 20180725040000
MDC IDC SET LEADCHNL RV PACING PULSEWIDTH: 0.4 ms

## 2016-11-18 ENCOUNTER — Other Ambulatory Visit (HOSPITAL_COMMUNITY): Payer: Self-pay | Admitting: *Deleted

## 2016-11-18 DIAGNOSIS — Z95811 Presence of heart assist device: Secondary | ICD-10-CM

## 2016-11-19 ENCOUNTER — Ambulatory Visit (HOSPITAL_COMMUNITY): Payer: Self-pay | Admitting: Pharmacist

## 2016-11-19 ENCOUNTER — Ambulatory Visit (HOSPITAL_COMMUNITY)
Admission: RE | Admit: 2016-11-19 | Discharge: 2016-11-19 | Disposition: A | Discharge status: Home / Self Care | Payer: 59 | Source: Ambulatory Visit | Attending: Internal Medicine | Admitting: Internal Medicine

## 2016-11-19 DIAGNOSIS — Z95811 Presence of heart assist device: Secondary | ICD-10-CM | POA: Diagnosis not present

## 2016-11-19 LAB — PROTIME-INR
INR: 2.16
PROTHROMBIN TIME: 24.5 s — AB (ref 11.4–15.2)

## 2016-11-19 NOTE — Progress Notes (Signed)
Patient presents to clinic today for drive line exit wound care, BP check and labs. . Existing VAD dressing removed and site care performed using sterile technique. Drive line exit site cleaned with Chlora prep applicators x 2, allowed to dry, and Sorbaview dressing with bio patch re-applied. Exit site healed and incorporated, the velour is fully implanted at exit site. No redness, tenderness, drainage, foul odor or rash noted. Drive line anchor re-applied. Pt denies fever or chills.   Return in 1 week for another dressing change per standard of care. INR to be repeated in 1-2 weeks pending results per anticoagulation protocol.     Patient Instructions: 1. Return to clinic in 1 week for dressing change.    Zada Girt RN, VAD Coordinator 24/7 pager [phone]

## 2016-11-19 NOTE — Addendum Note (Signed)
Encounter addended by: Lezlie Octave, RN on: 11/19/2016  3:17 PM<BR>    Actions taken: Sign clinical note

## 2016-11-24 ENCOUNTER — Other Ambulatory Visit (HOSPITAL_COMMUNITY): Payer: Self-pay | Admitting: Unknown Physician Specialty

## 2016-11-24 DIAGNOSIS — Z95811 Presence of heart assist device: Secondary | ICD-10-CM

## 2016-11-24 DIAGNOSIS — Z7901 Long term (current) use of anticoagulants: Secondary | ICD-10-CM

## 2016-11-26 ENCOUNTER — Ambulatory Visit (HOSPITAL_COMMUNITY)
Admission: RE | Admit: 2016-11-26 | Discharge: 2016-11-26 | Disposition: A | Discharge status: Home / Self Care | Payer: 59 | Source: Ambulatory Visit | Attending: Internal Medicine | Admitting: Internal Medicine

## 2016-11-26 DIAGNOSIS — Z95811 Presence of heart assist device: Secondary | ICD-10-CM | POA: Insufficient documentation

## 2016-11-26 DIAGNOSIS — Z7901 Long term (current) use of anticoagulants: Secondary | ICD-10-CM | POA: Diagnosis not present

## 2016-11-26 NOTE — Progress Notes (Signed)
Patient presents to clinic today for drive line exit wound care. Existing VAD dressing removed and site care performed using sterile technique. Drive line exit site cleaned with Chlora prep applicators x 2, allowed to dry, and Sorbaview dressing with bio patch re-applied. Exit site healed and incorporated, the velour is fully implanted at exit site. No redness, tenderness, drainage, foul odor or rash noted. Drive line anchor re-applied. Pt denies fever or chills.   Return in 1 week for another dressing change per standard of care. INR to be repeated in 1-2 weeks pending results per anticoagulation protocol.   Balinda Quails RN, VAD Coordinator 24/7 pager [phone]

## 2016-12-02 ENCOUNTER — Other Ambulatory Visit (HOSPITAL_COMMUNITY): Payer: Self-pay | Admitting: *Deleted

## 2016-12-02 DIAGNOSIS — Z95811 Presence of heart assist device: Secondary | ICD-10-CM

## 2016-12-03 ENCOUNTER — Ambulatory Visit (HOSPITAL_COMMUNITY): Payer: Self-pay | Admitting: Pharmacist

## 2016-12-03 ENCOUNTER — Ambulatory Visit (HOSPITAL_COMMUNITY)
Admission: RE | Admit: 2016-12-03 | Discharge: 2016-12-03 | Disposition: A | Discharge status: Home / Self Care | Payer: 59 | Source: Ambulatory Visit | Attending: Internal Medicine | Admitting: Internal Medicine

## 2016-12-03 DIAGNOSIS — Z95811 Presence of heart assist device: Secondary | ICD-10-CM | POA: Insufficient documentation

## 2016-12-03 LAB — PROTIME-INR
INR: 1.81
Prothrombin Time: 21.2 seconds — ABNORMAL HIGH (ref 11.4–15.2)

## 2016-12-03 NOTE — Progress Notes (Signed)
Patient presents to clinic today for drive line exit wound care. Existing VAD dressing removed and site care performed using sterile technique. Drive line exit site cleaned with Chlora prep applicators x 2, allowed to dry, and Sorbaview dressing with bio patch re-applied. Exit site healed and incorporated, the velour is fully implanted at exit site. No redness, tenderness, drainage, foul odor or rash noted. Drive line anchor re-applied. Pt denies fever or chills.   Return in 1 week for another dressing change per standard of care. INR to be repeated in 1 week pending results per anticoagulation protocol.   Oswald Pott RN, VAD Coordinator 24/7 pager [phone]  

## 2016-12-03 NOTE — Addendum Note (Signed)
Encounter addended by: Candy Sledge, RN on: 12/03/2016  2:35 PM<BR>    Actions taken: Sign clinical note

## 2016-12-07 ENCOUNTER — Encounter (HOSPITAL_COMMUNITY): Payer: 59

## 2016-12-09 ENCOUNTER — Ambulatory Visit (HOSPITAL_COMMUNITY): Payer: Self-pay | Admitting: Pharmacist

## 2016-12-09 ENCOUNTER — Other Ambulatory Visit (HOSPITAL_COMMUNITY): Payer: Self-pay | Admitting: *Deleted

## 2016-12-09 ENCOUNTER — Ambulatory Visit (HOSPITAL_COMMUNITY)
Admission: RE | Admit: 2016-12-09 | Discharge: 2016-12-09 | Disposition: A | Discharge status: Home / Self Care | Payer: 59 | Source: Ambulatory Visit | Attending: Cardiology | Admitting: Cardiology

## 2016-12-09 VITALS — BP 102/0 | HR 69 | Ht 67.0 in | Wt 197.4 lb

## 2016-12-09 DIAGNOSIS — N183 Chronic kidney disease, stage 3 unspecified: Secondary | ICD-10-CM

## 2016-12-09 DIAGNOSIS — Z95810 Presence of automatic (implantable) cardiac defibrillator: Secondary | ICD-10-CM | POA: Insufficient documentation

## 2016-12-09 DIAGNOSIS — M109 Gout, unspecified: Secondary | ICD-10-CM | POA: Diagnosis not present

## 2016-12-09 DIAGNOSIS — N186 End stage renal disease: Secondary | ICD-10-CM | POA: Diagnosis not present

## 2016-12-09 DIAGNOSIS — I429 Cardiomyopathy, unspecified: Secondary | ICD-10-CM | POA: Insufficient documentation

## 2016-12-09 DIAGNOSIS — I428 Other cardiomyopathies: Secondary | ICD-10-CM | POA: Diagnosis not present

## 2016-12-09 DIAGNOSIS — Z7901 Long term (current) use of anticoagulants: Secondary | ICD-10-CM

## 2016-12-09 DIAGNOSIS — M069 Rheumatoid arthritis, unspecified: Secondary | ICD-10-CM | POA: Diagnosis not present

## 2016-12-09 DIAGNOSIS — I5043 Acute on chronic combined systolic (congestive) and diastolic (congestive) heart failure: Secondary | ICD-10-CM

## 2016-12-09 DIAGNOSIS — I4901 Ventricular fibrillation: Secondary | ICD-10-CM | POA: Insufficient documentation

## 2016-12-09 DIAGNOSIS — Z992 Dependence on renal dialysis: Secondary | ICD-10-CM | POA: Insufficient documentation

## 2016-12-09 DIAGNOSIS — F329 Major depressive disorder, single episode, unspecified: Secondary | ICD-10-CM

## 2016-12-09 DIAGNOSIS — D649 Anemia, unspecified: Secondary | ICD-10-CM | POA: Diagnosis not present

## 2016-12-09 DIAGNOSIS — I10 Essential (primary) hypertension: Secondary | ICD-10-CM | POA: Diagnosis not present

## 2016-12-09 DIAGNOSIS — Z95811 Presence of heart assist device: Secondary | ICD-10-CM

## 2016-12-09 DIAGNOSIS — I5022 Chronic systolic (congestive) heart failure: Secondary | ICD-10-CM | POA: Diagnosis present

## 2016-12-09 DIAGNOSIS — I081 Rheumatic disorders of both mitral and tricuspid valves: Secondary | ICD-10-CM | POA: Diagnosis not present

## 2016-12-09 DIAGNOSIS — I482 Chronic atrial fibrillation: Secondary | ICD-10-CM | POA: Diagnosis present

## 2016-12-09 DIAGNOSIS — I132 Hypertensive heart and chronic kidney disease with heart failure and with stage 5 chronic kidney disease, or end stage renal disease: Secondary | ICD-10-CM | POA: Insufficient documentation

## 2016-12-09 DIAGNOSIS — E0590 Thyrotoxicosis, unspecified without thyrotoxic crisis or storm: Secondary | ICD-10-CM | POA: Diagnosis not present

## 2016-12-09 DIAGNOSIS — N179 Acute kidney failure, unspecified: Secondary | ICD-10-CM | POA: Insufficient documentation

## 2016-12-09 DIAGNOSIS — Z9119 Patient's noncompliance with other medical treatment and regimen: Secondary | ICD-10-CM | POA: Diagnosis not present

## 2016-12-09 DIAGNOSIS — M1000 Idiopathic gout, unspecified site: Secondary | ICD-10-CM

## 2016-12-09 DIAGNOSIS — F32A Depression, unspecified: Secondary | ICD-10-CM

## 2016-12-09 LAB — BASIC METABOLIC PANEL
Anion gap: 7 (ref 5–15)
BUN: 24 mg/dL — AB (ref 6–20)
CALCIUM: 8.4 mg/dL — AB (ref 8.9–10.3)
CO2: 23 mmol/L (ref 22–32)
CREATININE: 1.86 mg/dL — AB (ref 0.61–1.24)
Chloride: 108 mmol/L (ref 101–111)
GFR calc Af Amer: 48 mL/min — ABNORMAL LOW (ref >60)
GFR calc non Af Amer: 41 mL/min — ABNORMAL LOW (ref >60)
GLUCOSE: 92 mg/dL (ref 65–99)
Potassium: 4.4 mmol/L (ref 3.5–5.1)
Sodium: 138 mmol/L (ref 135–145)

## 2016-12-09 LAB — CBC
HEMATOCRIT: 44 % (ref 39.0–52.0)
Hemoglobin: 14.3 g/dL (ref 13.0–17.0)
MCH: 22.7 pg — AB (ref 26.0–34.0)
MCHC: 32.5 g/dL (ref 30.0–36.0)
MCV: 70 fL — AB (ref 78.0–100.0)
Platelets: 139 10*3/uL — ABNORMAL LOW (ref 150–400)
RBC: 6.29 MIL/uL — ABNORMAL HIGH (ref 4.22–5.81)
RDW: 17.8 % — AB (ref 11.5–15.5)
WBC: 5.2 10*3/uL (ref 4.0–10.5)

## 2016-12-09 LAB — PROTIME-INR
INR: 1.93
Prothrombin Time: 22.4 seconds — ABNORMAL HIGH (ref 11.4–15.2)

## 2016-12-09 LAB — LACTATE DEHYDROGENASE: LDH: 288 U/L — ABNORMAL HIGH (ref 98–192)

## 2016-12-09 MED ORDER — ALLOPURINOL 100 MG PO TABS: 100.0000 mg | ORAL_TABLET | Freq: Every day | ORAL | 11 refills | Status: DC

## 2016-12-09 MED ORDER — CITALOPRAM HYDROBROMIDE 20 MG PO TABS: 20.0000 mg | ORAL_TABLET | Freq: Every day | ORAL | 11 refills | Status: DC

## 2016-12-09 NOTE — Progress Notes (Signed)
Patient ID: [name], male   DOB: May 13, 1970, 46 [name].o.   MRN: [id number]    LVAD CLINIC NOTE  CHF: [name]   HPI: [name] is a 46 [name]/o male with systolic HF due to NICM (EF 20-25%) s/p [employer] ICD, chronic AF and gout who underwent HM II VAD implantation on 12/20/14. 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   Admitted from [name] in [place] in 5/17 with LVAD driveline abscess. Hospitalization was complicated by septic shock, MSSA bacteremia, CDiff, and AKI.He underwent surgical I&D of wound with exposed LVAD hardware. TEE did not show any vegetation on valves or ICD wires. Over the course of his hospitalization he required multiple blood products due to symptomatic anemia and blood loss from abdominal wound. Treated with IV abx for multiple weeks. Developed AKI/ESRD and required HD for several months before recovering.   Today he returns for HF/VAD follow up. Last visit 5 mg amlodipine was added but later stopped due to lower extremity edema. He was then started on 12.5 mg spiro and has tolerated without difficulty. . Overall feeling good. Denies SOB/PND/orthopnea. Over the weekend he took lasix on Sunday and Monday. Weight went down from 191--->185 pounds. Appetite ok. No bleeding problems. No BRBPR. Taking all medications   Echo 1/18. EF 20%. 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 Severe TR. Modrerate MR. Trivial AI  VAD Indication: Bridge to [employer]    VAD interrogation & Equipment Management: Speed: 9200 Flow:  ---,2.9, 2.8  Power:4.4  PI:3.9  Alarms: no clinical alarms Events: multiple PI events over the last 2 days. ? Diuresis with po lasix.   Fixed speed 9200        Low speed limit: 8600  Primary Controller: Replace back up battery in 22months. Back up controller: Replace back up battery in 82months. . Results for EDDY, [name] (MRN [id number]) as of 12/09/2016 11:44  Ref. Range 11/18/2015 10:07  12/16/2015 14:00 01/31/2016 10:20 07/01/2016 11:26 08/06/2016 11:31 10/06/2016 11:46  LDH Latest Ref Range: 98 - 192 U/L 208 (H) 189 225 (H) 225 (H) 232 (H) 253 (H)   RHC 11/2015 RA = 6 RV = 23/8 PA = 25/4 (19) PCW = 6 Fick cardiac output/index = 4.8/2.6 PVR = 2.4 WU Ao sat = 98% PA sat = 49%, 51% 1. Well compensated hemodynamics with VAD support. 2. PA sat low in the setting of marked anemia (Hgb 7.7 g/dl)     Past Medical History:  Diagnosis Date  . AICD (automatic cardioverter/defibrillator) present   . AKI (acute kidney injury) ([place])   . Atrial fibrillation -parosysmal     Rx w amiodarone  . Automatic implantable cardiac defibrillator -BSX    single chamber  . CHF (congestive heart failure) ([place])   . Chronic systolic heart failure (HCC)    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%)  . Elevated LFTs   . GI bleed -massive    11 Units 2012  . Gout   . H/O hyperthyroidism   . Hypertension   . Noncompliance    H/O  MEDICAL NONCOMPLIANCE  . 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 Polymorphic ventricular tachycardia (HCC)    RECURRENT WITH APPROPRIATE SHOCK THERAPY IN THE PAST  . RA (rheumatoid arthritis) ([place])   . Severe mitral regurgitation   . Tricuspid valve regurgitation    SEVERE  . Ventricular fibrillation (HCC)    WITH APPROPRIATE SHOCK THERAPY IN THE PAST  Current Outpatient Prescriptions  Medication Sig Dispense Refill  . allopurinol (ZYLOPRIM) 100 MG tablet Take 1 tablet (100 mg total) by mouth daily. 30 tablet 6  . citalopram (CELEXA) 20 MG tablet take 1 tablet by mouth once daily 30 tablet 6  . ferrous sulfate 324 (65 Fe) MG TBEC Take 1 tablet (325 mg total) by mouth 2 (two) times daily. 60 tablet 5  . furosemide (LASIX) 40 MG tablet Take 1 tablet (40 mg total) by mouth daily as needed (weight gain or swelling). 30 tablet 6  . pregabalin (LYRICA) 75 MG capsule Take 75 mg by mouth daily.    .  sacubitril-valsartan (ENTRESTO) 97-103 MG Take 1 tablet by mouth 2 (two) times daily. 60 tablet 11  . sildenafil (REVATIO) 20 MG tablet Take 20 mg by mouth 3 (three) times daily.    Marland Kitchen spironolactone (ALDACTONE) 25 MG tablet Take 0.5 tablets (12.5 mg total) by mouth daily. 15 tablet 5  . tamsulosin (FLOMAX) 0.4 MG CAPS capsule Take 0.4 mg by mouth daily.    Marland Kitchen warfarin (COUMADIN) 4 MG tablet Take 6 mg (1 and 1/2 tablets) daily except 4 mg (1 tablet) on Mon/Fri.    . zolpidem (AMBIEN) 10 MG tablet Take 10 mg by mouth at bedtime as needed for sleep.    Marland Kitchen amoxicillin (AMOXIL) 500 MG capsule Take 4 capsules (2,000 mg total) by mouth as directed. 1 hour prior to dental appointment (Patient not taking: Reported on 12/09/2016) 4 capsule 1   No current facility-administered medications for this encounter.     Phytonadione  REVIEW OF SYSTEMS: All systems negative except as listed in HPI, PMH and Problem list.  Vital Signs:  BP (!) 102/0 Comment: Doppler modified systolic  Pulse 69   Ht 5\' 7"  (1.702 m)   Wt 197 lb 6.4 oz (89.5 kg)   SpO2 99%   BMI 30.92 kg/m    Vital Signs:  Doppler Pressure             Automatc BP: 96/76 (83)  HR: 69 SPO2: 99  Weight: 197 pounds  Last weight: 202 pounds  Home weights: 191-->185 pounds.    Physical Exam: GENERAL: Well appearing, male who presents to clinic today in no acute distress. HEENT: normal  NECK: Supple, JVP 6-7  .  2+ bilaterally, no bruits.  No lymphadenopathy or thyromegaly appreciated.   CARDIAC:  Mechanical heart sounds with LVAD hum present.  LUNGS:  Clear to auscultation bilaterally.  ABDOMEN:  Soft, round, nontender, positive bowel sounds x4.     LVAD exit site: well-healed and incorporated.  Dressing dry and intact.  No erythema or drainage.  Stabilization device present and accurately applied.  Driveline dressing is being changed daily per sterile technique. EXTREMITIES:  Warm and dry, no cyanosis, clubbing, rash or edema    NEUROLOGIC:  Alert and oriented x 4.  Gait steady.  No aphasia.  No dysarthria.  Affect pleasant.        ASSESSMENT AND PLAN:   1. Chronic systolic HF due to NICM:  --Status post HMII LVAD implantation 12/20/14.Echo 1/18. Still with significant RV dysfunction and severe TR   Currently being considered for transplant at [facility]. Work underway.  NYHA 1. Volume status a little low. PI/Flow lower than normal. Suspected over diuresed.  I have asked him to drink a little extra fluid today. Continue lasix as needed.  --VAD parameters personally reviewed - stable 2. RV failure:  Now off milrinone.   Off digoxin with  renal failure.  --echo as above.   Continue sildenafil can titrate as needed 3. HTN:  --Map stable. Continue current regimen. --intolerant amlodipine due to leg edema.  4. Atrial fibrillation: .  --Rate controlled.  On coumadin for VAD. He is no longer on amiodarone.  5. Anticoagulation management: INR goal 2.0-2.5, no aspirin. INR 1.93. Discussed with Pharm D.  6.  CKD- Stage III --creatinine up a little but not surprising as he took lasix for 2 days.  7. Driveline infection: wound completely healed. Has seen ID. Now off abx. Resolved.  8. Anemia --Stable. Hgb 14.3.   Follow up next week for driveline dressing and to assess VAD parameters. Todays flow and PI low. ? Over diuresis.   Greater than 50% of the (total minutes 26) visit spent in counseling/coordination of care regarding medications, vad parameters, and HF food choices.    Below VAD Coordinator notes.   Patient presents for 2 month  follow up in [facility] today. Reports no problems with VAD equipment or concerns with drive line. Noticed VAD parameters flow and PI is lower than baseline. Pt did take PRN 40 mg Lasix on Sun and Mon for "felt my wt was up". Home wt got up to 191 lbs and is down to 185 lbs after two doses of Lasix.  Vital Signs:  Ht: 5'7" Doppler Pressure: 102 Automatc BP: 96/76 (83) HR:  69 SPO2: 99  %  Weight: 197.4 lbs w/o eqt Last weight: 202.6  lbs Home weights: 185 - 191 lbs   VAD Indication: Bridge to Transplant-Status 7 at [facility]; completed [facility] evaluation last week; will hear about listing status 12/10/16  VAD interrogation & Equipment Management: Speed:9200 Flow: ---; 2.8 Power: 4.4 w PI: 3.9 Alarms: no clinical alarms Events: 25 today; 28 on 8/21  Fixed speed 9200        Low speed limit: 8600  Primary Controller: Replace back up battery in 29 months. Back up controller: Replace back up battery in 26months.  Annual Equipment Maintenance on UBC/PM was performed on 12/2015.  I reviewed the LVAD parameters from todayand compared the results to the patient's prior recorded data.LVAD interrogation was NEGATIVEfor significant power changes, NEGATIVEfor clinicalalarms and STABLEfor PI events/speed drops. No programming changes were madeand pump is functioning within specified parameters. Pt is performing daily controller and system monitor self tests along with completing weekly and monthly maintenance for LVAD equipment.  LVAD equipment check completed and is in good working order. Back-up equipment present. Charged back up battery and performed self-test on equipment.   Exit Site Care: Drive line is being maintained weekly by VAD Coordinators. Drive line exit site well healed and incorporated. The velour is fully implanted at exit site. Dressing dry and intact. No erythema or drainage. Stabilization device present and accurately applied. Pt denies fever or chills.   Significant Events on VAD Support:  08/2015 >> AKI requiring dialysis, Bactremia / VAD Pocket infection requiring debriedment. GIB  Device:BS Single Lead Therapies: on VF 220  BP &Labs:  MAP 100- Doppler is reflecting systolic BP  Hgb 30.1 - No S/S of bleeding. Specifically denies melena/BRBPR or nosebleeds.  LDH stable at 288with established baseline of 230- 330. Denies  tea-colored urine. No power elevations noted on interrogation.   Batteries Manufacture Date: Number of uses: Re-calibration  10/16/14 362 To be performed by pt   Pt has not been rotating his 8 batteries and has been using only 4 batteries. These are near end of life based on #  of uses. Instructed pt to take out his other 4 batteries, charge them, and start using them. Instructed on how to re-calibrate batteries when prompted to do so.  Backup system controller 11 volt battery charged during visit.  Pt will bring his home equipment to next clinic visit for annual maintenance.    2 year Intermacs follow up completed including:  Quality of Life, KCCQ-12, and Neurocognitive trail making.   Pt completed 1460 feet during 6 minute walk.   Patient Instructions: 1. No change in medications. Increase fluid intake.  2. RTC in one week for dressing change and VAD parameter check.  3. Return for VAD clinic appt in two months. P will bring his home VAD equipment for annual maintenance at that visit.    Zada Girt RN VAD Coordinator   Office: [phone] 24/7 Emergency VAD Pager: [place] NP-C  12/09/2016

## 2016-12-09 NOTE — Patient Instructions (Addendum)
1.  No change in medications. Increase fluid intake. 2. RTC in one week for dressing change and VAD parameter check.  3. Return for VAD clinic appt in two months. P will bring his home VAD equipment for annual maintenance at that visit.

## 2016-12-09 NOTE — Progress Notes (Signed)
Patient presents for 2 month  follow up in [facility] today. Reports no problems with VAD equipment or concerns with drive line. Noticed VAD parameters flow and PI is lower than baseline. Pt did take PRN 40 mg Lasix on Sun and Mon for "felt my wt was up". Home wt got up to 191 lbs and is down to 185 lbs after two doses of Lasix.  Vital Signs:  Ht: 5'7" Doppler Pressure: 102 Automatc BP: 96/76 (83) HR:  69 SPO2: 99 %  Weight: 197.4 lbs w/o eqt Last weight: 202.6  lbs Home weights: 185 - 191 lbs   VAD Indication: Bridge to Transplant-Status 7 at [facility]; completed [facility] evaluation last week; will hear about listing status 12/10/16  VAD interrogation & Equipment Management: Speed:9200 Flow: ---; 2.8 Power: 4.4 w    PI: 3.9 Alarms: no clinical alarms Events: 25 today; 28 on 8/21  Fixed speed 9200  Low speed limit: 8600  Primary Controller:  Replace back up battery in 29 months. Back up controller:   Replace back up battery in 9 months.  Annual Equipment Maintenance on UBC/PM was performed on 12/2015.   I reviewed the LVAD parameters from today and compared the results to the patient's prior recorded data. LVAD interrogation was NEGATIVE for significant power changes, NEGATIVE for clinical alarms and STABLE for PI events/speed drops. No programming changes were made and pump is functioning within specified parameters. Pt is performing daily controller and system monitor self tests along with completing weekly and monthly maintenance for LVAD equipment.  LVAD equipment check completed and is in good working order. Back-up equipment present. Charged back up battery and performed self-test on equipment.   Exit Site Care: Drive line is being maintained weekly  by VAD Coordinators. Drive line exit site well healed and incorporated. The velour is fully implanted at exit site. Dressing dry and intact. No erythema or drainage. Stabilization device present and accurately applied. Pt denies  fever or chills.   Significant Events on VAD Support:  08/2015 >> AKI requiring dialysis, Bactremia / VAD Pocket infection requiring debriedment. GIB  Device:BS Single Lead Therapies: on VF 220  BP & Labs:  MAP 100 - Doppler is reflecting systolic BP  Hgb 96.7 - No S/S of bleeding. Specifically denies melena/BRBPR or nosebleeds.  LDH stable at 288 with established baseline of 230- 330. Denies tea-colored urine. No power elevations noted on interrogation.   Batteries Manufacture Date: Number of uses: Re-calibration  10/16/14 362 To be performed by pt   Pt has not been rotating his 8 batteries and has been using only 4 batteries. These are near end of life based on # of uses. Instructed pt to take out his other 4 batteries, charge them, and start using them. Instructed on how to re-calibrate batteries when prompted to do so.  Backup system controller 11 volt battery charged during visit.  Pt will bring his home equipment to next clinic visit for annual maintenance.    2 year Intermacs follow up completed including:  Quality of Life, KCCQ-12, and Neurocognitive trail making.   Pt completed 1460 feet during 6 minute walk.   Patient Instructions: 1. No change in medications. Increase fluid intake.  2. RTC in one week for dressing change and VAD parameter check.  3. Return for VAD clinic appt in two months. P will bring his home VAD equipment for annual maintenance at that visit.    Zada Girt RN [place] Coordinator   Office: [phone] 24/7 Emergency VAD  Pager: [phone]

## 2016-12-10 ENCOUNTER — Encounter (HOSPITAL_COMMUNITY): Payer: 59

## 2016-12-11 ENCOUNTER — Other Ambulatory Visit (HOSPITAL_COMMUNITY): Payer: Self-pay | Admitting: Unknown Physician Specialty

## 2016-12-11 DIAGNOSIS — Z7901 Long term (current) use of anticoagulants: Secondary | ICD-10-CM

## 2016-12-11 DIAGNOSIS — Z95811 Presence of heart assist device: Secondary | ICD-10-CM

## 2016-12-15 ENCOUNTER — Ambulatory Visit (HOSPITAL_COMMUNITY): Payer: Self-pay | Admitting: Pharmacist

## 2016-12-15 ENCOUNTER — Ambulatory Visit (HOSPITAL_COMMUNITY)
Admission: RE | Admit: 2016-12-15 | Discharge: 2016-12-15 | Disposition: A | Discharge status: Home / Self Care | Payer: 59 | Source: Ambulatory Visit | Attending: Internal Medicine | Admitting: Internal Medicine

## 2016-12-15 DIAGNOSIS — Z95811 Presence of heart assist device: Secondary | ICD-10-CM | POA: Diagnosis not present

## 2016-12-15 DIAGNOSIS — Z7901 Long term (current) use of anticoagulants: Secondary | ICD-10-CM | POA: Insufficient documentation

## 2016-12-15 LAB — PROTIME-INR
INR: 2.46
PROTHROMBIN TIME: 26.5 s — AB (ref 11.4–15.2)

## 2016-12-15 NOTE — Addendum Note (Signed)
Encounter addended by: Christinia Gully, RN on: 12/15/2016 12:47 PM<BR>    Actions taken: Sign clinical note

## 2016-12-15 NOTE — Progress Notes (Signed)
Patient presents to clinic today for drive line exit wound care. Existing VAD dressing removed and site care performed using sterile technique. Drive line exit site cleaned with Chlora prep applicators x 2, allowed to dry, and Sorbaview dressing with bio patch re-applied. Exit site healed and incorporated, the velour is fully implanted at exit site. No redness, tenderness, drainage, foul odor or rash noted. Drive line anchor re-applied. Pt denies fever or chills.   VAD interrogation & Equipment Management: Speed:9200 Flow: 4.4 Power: 5.1 w PI: 7.0 Alarms: no clinical alarms Events: 3-5 PI  Fixed speed 9200        Low speed limit: 8600  Primary Controller: Replace back up battery in 29 months. Back up controller: Replace back up battery in 58months.   Return in 1 week for another dressing change per standard of care. INR to be repeated in 1 week pending results per anticoagulation protocol.   Tanda Rockers RN, VAD Coordinator 24/7 pager [phone]

## 2016-12-24 ENCOUNTER — Ambulatory Visit (HOSPITAL_COMMUNITY)
Admission: RE | Admit: 2016-12-24 | Discharge: 2016-12-24 | Disposition: A | Discharge status: Home / Self Care | Payer: 59 | Source: Ambulatory Visit | Attending: Cardiology | Admitting: Cardiology

## 2016-12-24 ENCOUNTER — Other Ambulatory Visit (HOSPITAL_COMMUNITY): Payer: Self-pay | Admitting: *Deleted

## 2016-12-24 ENCOUNTER — Ambulatory Visit (HOSPITAL_COMMUNITY): Payer: Self-pay | Admitting: Pharmacist

## 2016-12-24 DIAGNOSIS — Z95811 Presence of heart assist device: Secondary | ICD-10-CM | POA: Insufficient documentation

## 2016-12-24 DIAGNOSIS — E611 Iron deficiency: Secondary | ICD-10-CM

## 2016-12-24 LAB — PROTIME-INR
INR: 2.36
Prothrombin Time: 25.6 seconds — ABNORMAL HIGH (ref 11.4–15.2)

## 2016-12-24 MED ORDER — FERROUS SULFATE 324 (65 FE) MG PO TBEC: 1.0000 | DELAYED_RELEASE_TABLET | Freq: Two times a day (BID) | ORAL | 5 refills | Status: DC

## 2016-12-24 NOTE — Progress Notes (Signed)
Patient presents to clinic today for drive line exit wound care. Existing VAD dressing removed and site care performed using sterile technique. Drive line exit site cleaned with Chlora prep applicators x 2, allowed to dry, and Sorbaview dressing with bio patch re-applied. Exit site healed and incorporated, the velour is fully implanted at exit site. No redness, tenderness, drainage, foul odor or rash noted. Drive line anchor re-applied. Pt denies fever or chills.   Return in 1 week for another dressing change per standard of care. INR to be repeated in 1 week pending results per anticoagulation protocol.   Jasmia Angst RN, VAD Coordinator 24/7 pager [phone]  

## 2016-12-25 ENCOUNTER — Other Ambulatory Visit (HOSPITAL_COMMUNITY): Payer: Self-pay | Admitting: Unknown Physician Specialty

## 2016-12-25 DIAGNOSIS — Z7901 Long term (current) use of anticoagulants: Secondary | ICD-10-CM

## 2016-12-25 DIAGNOSIS — Z95811 Presence of heart assist device: Secondary | ICD-10-CM

## 2016-12-30 ENCOUNTER — Ambulatory Visit (HOSPITAL_COMMUNITY)
Admission: RE | Admit: 2016-12-30 | Discharge: 2016-12-30 | Disposition: A | Discharge status: Home / Self Care | Payer: 59 | Source: Ambulatory Visit | Attending: Cardiology | Admitting: Cardiology

## 2016-12-30 ENCOUNTER — Ambulatory Visit (HOSPITAL_COMMUNITY): Payer: Self-pay | Admitting: Pharmacist

## 2016-12-30 DIAGNOSIS — Z7901 Long term (current) use of anticoagulants: Secondary | ICD-10-CM | POA: Insufficient documentation

## 2016-12-30 DIAGNOSIS — Z95811 Presence of heart assist device: Secondary | ICD-10-CM | POA: Insufficient documentation

## 2016-12-30 LAB — PROTIME-INR
INR: 2.34
Prothrombin Time: 25.5 seconds — ABNORMAL HIGH (ref 11.4–15.2)

## 2016-12-30 NOTE — Progress Notes (Signed)
Patient presents to clinic today for drive line exit wound care. Existing VAD dressing removed and site care performed using sterile technique. Drive line exit site cleaned with Chlora prep applicators x 2, allowed to dry, and Sorbaview dressing with bio patch re-applied. Exit site healed and incorporated, the velour is fully implanted at exit site. No redness, tenderness, drainage, foul odor or rash noted. Drive line anchor re-applied. Pt denies fever or chills.   Return in 1 week for another dressing change per standard of care. INR to be repeated in 1-2 weeks pending results per anticoagulation protocol.   Balinda Quails RN, VAD Coordinator 24/7 pager [phone]

## 2016-12-30 NOTE — Addendum Note (Signed)
Encounter addended by: Candy Sledge, RN on: 12/30/2016  1:03 PM<BR>    Actions taken: Sign clinical note

## 2016-12-31 ENCOUNTER — Other Ambulatory Visit (HOSPITAL_COMMUNITY): Payer: 59

## 2016-12-31 ENCOUNTER — Other Ambulatory Visit (HOSPITAL_COMMUNITY): Payer: Self-pay | Admitting: Unknown Physician Specialty

## 2016-12-31 DIAGNOSIS — Z7901 Long term (current) use of anticoagulants: Secondary | ICD-10-CM

## 2016-12-31 DIAGNOSIS — Z95811 Presence of heart assist device: Secondary | ICD-10-CM

## 2017-01-07 ENCOUNTER — Ambulatory Visit (HOSPITAL_COMMUNITY)
Admission: RE | Admit: 2017-01-07 | Discharge: 2017-01-07 | Disposition: A | Discharge status: Home / Self Care | Payer: 59 | Source: Ambulatory Visit | Attending: Internal Medicine | Admitting: Internal Medicine

## 2017-01-07 ENCOUNTER — Ambulatory Visit (HOSPITAL_COMMUNITY): Payer: Self-pay | Admitting: Pharmacist

## 2017-01-07 DIAGNOSIS — Z95811 Presence of heart assist device: Secondary | ICD-10-CM | POA: Insufficient documentation

## 2017-01-07 DIAGNOSIS — Z7901 Long term (current) use of anticoagulants: Secondary | ICD-10-CM | POA: Insufficient documentation

## 2017-01-07 LAB — PROTIME-INR
INR: 2.34
Prothrombin Time: 25.4 seconds — ABNORMAL HIGH (ref 11.4–15.2)

## 2017-01-07 NOTE — Progress Notes (Signed)
Patient presents to clinic today for drive line exit wound care. Existing VAD dressing removed and site care performed using sterile technique. Drive line exit site cleaned with Chlora prep applicators x 2, allowed to dry, and Sorbaview dressing with bio patch re-applied. Exit site healed and incorporated, the velour is fully implanted at exit site. No redness, tenderness, drainage, foul odor or rash noted. Drive line anchor re-applied. Pt denies fever or chills.   Return in 1 week for another dressing change per standard of care. INR to be repeated in 1-2 weeks pending results per anticoagulation protocol.   Zada Girt RN, VAD Coordinator 24/7 pager [phone]

## 2017-01-07 NOTE — Addendum Note (Signed)
Encounter addended by: Lezlie Octave, RN on: 01/07/2017 12:45 PM<BR>    Actions taken: Sign clinical note

## 2017-01-08 ENCOUNTER — Other Ambulatory Visit (HOSPITAL_COMMUNITY): Payer: Self-pay | Admitting: Unknown Physician Specialty

## 2017-01-08 DIAGNOSIS — Z7901 Long term (current) use of anticoagulants: Secondary | ICD-10-CM

## 2017-01-08 DIAGNOSIS — Z95811 Presence of heart assist device: Secondary | ICD-10-CM

## 2017-01-13 ENCOUNTER — Ambulatory Visit (HOSPITAL_COMMUNITY)
Admission: RE | Admit: 2017-01-13 | Discharge: 2017-01-13 | Disposition: A | Discharge status: Home / Self Care | Payer: 59 | Source: Ambulatory Visit | Attending: Cardiology | Admitting: Cardiology

## 2017-01-13 ENCOUNTER — Ambulatory Visit (HOSPITAL_COMMUNITY): Payer: Self-pay | Admitting: Pharmacist

## 2017-01-13 DIAGNOSIS — Z7901 Long term (current) use of anticoagulants: Secondary | ICD-10-CM | POA: Insufficient documentation

## 2017-01-13 DIAGNOSIS — Z95811 Presence of heart assist device: Secondary | ICD-10-CM | POA: Diagnosis not present

## 2017-01-13 LAB — PROTIME-INR
INR: 2.17
Prothrombin Time: 24 seconds — ABNORMAL HIGH (ref 11.4–15.2)

## 2017-01-13 NOTE — Progress Notes (Signed)
Patient presents to clinic today for drive line exit wound care. Existing VAD dressing removed and site care performed using sterile technique. Drive line exit site cleaned with Chlora prep applicators x 2, allowed to dry, and Sorbaview dressing with bio patch re-applied. Exit site healed and incorporated, the velour is fully implanted at exit site. No redness, tenderness, drainage, foul odor or rash noted. Drive line anchor re-applied. Pt denies fever or chills.   Return in 1 week for another dressing change per standard of care. INR to be repeated in 1-2 weeks pending results per anticoagulation protocol.    Balinda Quails RN, VAD Coordinator 24/7 pager [phone]

## 2017-01-15 ENCOUNTER — Other Ambulatory Visit (HOSPITAL_COMMUNITY): Payer: Self-pay | Admitting: *Deleted

## 2017-01-15 DIAGNOSIS — Z95811 Presence of heart assist device: Secondary | ICD-10-CM

## 2017-01-21 ENCOUNTER — Inpatient Hospital Stay (HOSPITAL_COMMUNITY): Admission: RE | Admit: 2017-01-21 | Payer: 59 | Source: Ambulatory Visit

## 2017-01-22 ENCOUNTER — Ambulatory Visit (HOSPITAL_COMMUNITY): Payer: Self-pay | Admitting: Pharmacist

## 2017-01-22 ENCOUNTER — Ambulatory Visit (HOSPITAL_COMMUNITY)
Admission: RE | Admit: 2017-01-22 | Discharge: 2017-01-22 | Disposition: A | Discharge status: Home / Self Care | Payer: 59 | Source: Ambulatory Visit | Attending: Internal Medicine | Admitting: Internal Medicine

## 2017-01-22 ENCOUNTER — Other Ambulatory Visit (HOSPITAL_COMMUNITY): Payer: Self-pay | Admitting: Unknown Physician Specialty

## 2017-01-22 DIAGNOSIS — Z95811 Presence of heart assist device: Secondary | ICD-10-CM

## 2017-01-22 DIAGNOSIS — Z7901 Long term (current) use of anticoagulants: Secondary | ICD-10-CM

## 2017-01-22 LAB — PROTIME-INR
INR: 2.23
Prothrombin Time: 24.5 seconds — ABNORMAL HIGH (ref 11.4–15.2)

## 2017-01-22 NOTE — Progress Notes (Signed)
Patient presents to clinic today for drive line exit wound care. Existing VAD dressing removed and site care performed using sterile technique. Drive line exit site cleaned with Chlora prep applicators x 2, allowed to dry, and Sorbaview dressing with bio patch re-applied. Exit site healed and incorporated, the velour is fully implanted at exit site. No redness, tenderness, drainage, foul odor or rash noted. Drive line anchor re-applied. Pt denies fever or chills.   Return in 1 week for another dressing change per standard of care. INR to be repeated in 1-2 weeks pending results per anticoagulation protocol.   Balinda Quails RN, VAD Coordinator 24/7 pager [phone]

## 2017-01-22 NOTE — Addendum Note (Signed)
Encounter addended by: Candy Sledge, RN on: 01/22/2017  1:08 PM<BR>    Actions taken: Sign clinical note

## 2017-01-27 ENCOUNTER — Ambulatory Visit (HOSPITAL_COMMUNITY): Payer: Self-pay | Admitting: Pharmacist

## 2017-01-27 ENCOUNTER — Ambulatory Visit (HOSPITAL_COMMUNITY)
Admission: RE | Admit: 2017-01-27 | Discharge: 2017-01-27 | Disposition: A | Discharge status: Home / Self Care | Payer: 59 | Source: Ambulatory Visit | Attending: Internal Medicine | Admitting: Internal Medicine

## 2017-01-27 DIAGNOSIS — Z7901 Long term (current) use of anticoagulants: Secondary | ICD-10-CM

## 2017-01-27 DIAGNOSIS — Z95811 Presence of heart assist device: Secondary | ICD-10-CM | POA: Insufficient documentation

## 2017-01-27 LAB — PROTIME-INR
INR: 2.14
PROTHROMBIN TIME: 23.7 s — AB (ref 11.4–15.2)

## 2017-01-27 NOTE — Progress Notes (Signed)
Patient presents to clinic today for drive line exit wound care. Existing VAD dressing removed and site care performed using sterile technique. Drive line exit site cleaned with Chlora prep applicators x 2, allowed to dry, and Sorbaview dressing with bio patch re-applied. Exit site healed and incorporated, the velour is fully implanted at exit site. No redness, tenderness, drainage, foul odor or rash noted. Drive line anchor re-applied. Pt denies fever or chills.   Return in 1 week for another dressing change per standard of care. INR to be repeated in 1-2 weeks pending results per anticoagulation protocol.   Balinda Quails RN, VAD Coordinator 24/7 pager [phone]

## 2017-01-28 ENCOUNTER — Telehealth (HOSPITAL_COMMUNITY): Payer: Self-pay | Admitting: *Deleted

## 2017-01-28 NOTE — Telephone Encounter (Signed)
Herschel Senegal [facility] Transplant Coordinator) called to inform VAD team that after presentation of Mr. Bartl at their weekly transplant meeting this am, he will be listed status 1B today.

## 2017-02-02 ENCOUNTER — Ambulatory Visit (HOSPITAL_COMMUNITY): Payer: Self-pay | Admitting: Pharmacist

## 2017-02-02 ENCOUNTER — Other Ambulatory Visit (HOSPITAL_COMMUNITY): Payer: Self-pay | Admitting: Internal Medicine

## 2017-02-02 ENCOUNTER — Other Ambulatory Visit (HOSPITAL_COMMUNITY): Payer: Self-pay | Admitting: *Deleted

## 2017-02-02 ENCOUNTER — Encounter (HOSPITAL_COMMUNITY): Payer: Self-pay

## 2017-02-02 ENCOUNTER — Ambulatory Visit (HOSPITAL_COMMUNITY)
Admission: RE | Admit: 2017-02-02 | Discharge: 2017-02-02 | Disposition: A | Discharge status: Home / Self Care | Payer: 59 | Source: Ambulatory Visit | Attending: Internal Medicine | Admitting: Internal Medicine

## 2017-02-02 VITALS — BP 116/71 | HR 67 | Ht 67.0 in | Wt 202.4 lb

## 2017-02-02 DIAGNOSIS — E611 Iron deficiency: Secondary | ICD-10-CM

## 2017-02-02 DIAGNOSIS — D500 Iron deficiency anemia secondary to blood loss (chronic): Secondary | ICD-10-CM | POA: Diagnosis not present

## 2017-02-02 DIAGNOSIS — D631 Anemia in chronic kidney disease: Secondary | ICD-10-CM | POA: Insufficient documentation

## 2017-02-02 DIAGNOSIS — N179 Acute kidney failure, unspecified: Secondary | ICD-10-CM | POA: Insufficient documentation

## 2017-02-02 DIAGNOSIS — I10 Essential (primary) hypertension: Secondary | ICD-10-CM | POA: Diagnosis not present

## 2017-02-02 DIAGNOSIS — Z95811 Presence of heart assist device: Secondary | ICD-10-CM

## 2017-02-02 DIAGNOSIS — I429 Cardiomyopathy, unspecified: Secondary | ICD-10-CM | POA: Insufficient documentation

## 2017-02-02 DIAGNOSIS — E0590 Thyrotoxicosis, unspecified without thyrotoxic crisis or storm: Secondary | ICD-10-CM | POA: Diagnosis not present

## 2017-02-02 DIAGNOSIS — Z9889 Other specified postprocedural states: Secondary | ICD-10-CM | POA: Insufficient documentation

## 2017-02-02 DIAGNOSIS — Z7682 Awaiting organ transplant status: Secondary | ICD-10-CM | POA: Insufficient documentation

## 2017-02-02 DIAGNOSIS — M109 Gout, unspecified: Secondary | ICD-10-CM | POA: Diagnosis not present

## 2017-02-02 DIAGNOSIS — I482 Chronic atrial fibrillation: Secondary | ICD-10-CM | POA: Insufficient documentation

## 2017-02-02 DIAGNOSIS — Z95810 Presence of automatic (implantable) cardiac defibrillator: Secondary | ICD-10-CM | POA: Diagnosis not present

## 2017-02-02 DIAGNOSIS — Z79899 Other long term (current) drug therapy: Secondary | ICD-10-CM | POA: Diagnosis not present

## 2017-02-02 DIAGNOSIS — Z7902 Long term (current) use of antithrombotics/antiplatelets: Secondary | ICD-10-CM | POA: Diagnosis not present

## 2017-02-02 DIAGNOSIS — I5022 Chronic systolic (congestive) heart failure: Secondary | ICD-10-CM | POA: Insufficient documentation

## 2017-02-02 DIAGNOSIS — I132 Hypertensive heart and chronic kidney disease with heart failure and with stage 5 chronic kidney disease, or end stage renal disease: Secondary | ICD-10-CM | POA: Diagnosis not present

## 2017-02-02 DIAGNOSIS — M069 Rheumatoid arthritis, unspecified: Secondary | ICD-10-CM | POA: Insufficient documentation

## 2017-02-02 DIAGNOSIS — N186 End stage renal disease: Secondary | ICD-10-CM | POA: Insufficient documentation

## 2017-02-02 LAB — COMPREHENSIVE METABOLIC PANEL
ALBUMIN: 3.6 g/dL (ref 3.5–5.0)
ALK PHOS: 96 U/L (ref 38–126)
ALT: 20 U/L (ref 17–63)
ANION GAP: 5 (ref 5–15)
AST: 27 U/L (ref 15–41)
BUN: 23 mg/dL — ABNORMAL HIGH (ref 6–20)
CALCIUM: 8.5 mg/dL — AB (ref 8.9–10.3)
CHLORIDE: 111 mmol/L (ref 101–111)
CO2: 21 mmol/L — AB (ref 22–32)
Creatinine, Ser: 1.83 mg/dL — ABNORMAL HIGH (ref 0.61–1.24)
GFR calc non Af Amer: 42 mL/min — ABNORMAL LOW (ref >60)
GFR, EST AFRICAN AMERICAN: 49 mL/min — AB (ref >60)
Glucose, Bld: 99 mg/dL (ref 65–99)
POTASSIUM: 4.5 mmol/L (ref 3.5–5.1)
SODIUM: 137 mmol/L (ref 135–145)
Total Bilirubin: 0.6 mg/dL (ref 0.3–1.2)
Total Protein: 7.6 g/dL (ref 6.5–8.1)

## 2017-02-02 LAB — CBC
HEMATOCRIT: 38.2 % — AB (ref 39.0–52.0)
HEMOGLOBIN: 11.9 g/dL — AB (ref 13.0–17.0)
MCH: 22.3 pg — AB (ref 26.0–34.0)
MCHC: 31.2 g/dL (ref 30.0–36.0)
MCV: 71.5 fL — ABNORMAL LOW (ref 78.0–100.0)
Platelets: 134 10*3/uL — ABNORMAL LOW (ref 150–400)
RBC: 5.34 MIL/uL (ref 4.22–5.81)
RDW: 19.1 % — ABNORMAL HIGH (ref 11.5–15.5)
WBC: 3.7 10*3/uL — ABNORMAL LOW (ref 4.0–10.5)

## 2017-02-02 LAB — LACTATE DEHYDROGENASE: LDH: 262 U/L — ABNORMAL HIGH (ref 98–192)

## 2017-02-02 LAB — PROTIME-INR
INR: 2.23
PROTHROMBIN TIME: 24.5 s — AB (ref 11.4–15.2)

## 2017-02-02 NOTE — Progress Notes (Signed)
Patient ID: [name], male   DOB: August 06, 1970, 46 y.o.   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   Admitted from [facility] in [place] in 5/17 with LVAD driveline abscess. Hospitalization was complicated by septic shock, MSSA bacteremia, CDiff, and AKI.He underwent surgical I&D of wound with exposed LVAD hardware. TEE did not show any vegetation on valves or ICD wires. Over the course of his hospitalization he required multiple blood products due to symptomatic anemia and blood loss from abdominal wound. Treated with IV abx for multiple weeks. Developed AKI/ESRD and required HD for several months before recovering.   Returns for LVAD f/u. Doing well. Recently listed at [facility] for transplant (now status 7 at [facility] due to hold on VAD to transplant patients). Feels good. Denies CP, DOE, orthopnea or PND. Occasional edema and wil take diuretics as needed. Had epistaxis a few nights ago but resolved. No other bleeding or dark stools. Compliant with meds. No problems with driveline. No neuro symptoms or fevers/chills.   Echo 1/18. EF 20%. 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 Severe TR. Modrerate MR. Trivial AI  VAD Indication: Bridge to Transplant- Listed 1B at [facility], status 7 at [facility]    VAD interrogation & Equipment Management: Speed:9200 Flow: 3.5 Power:4.7 w PI:6.6  Alarms: no clinical alarms Events: 0-5 PI events daily  Fixed speed 9200 Low speed limit: 8600  Primary Controller: Replace back up battery in 59months. Back up controller: Replace back up battery in 33months. . RHC 11/2015 RA = 6 RV = 23/8 PA = 25/4 (19) PCW = 6 Fick cardiac output/index = 4.8/2.6 PVR = 2.4 WU Ao sat = 98% PA sat = 49%, 51% 1. Well  compensated hemodynamics with VAD support. 2. PA sat low in the setting of marked anemia (Hgb 7.7 g/dl)     Past Medical History:  Diagnosis Date  . AICD (automatic cardioverter/defibrillator) present   . AKI (acute kidney injury) ([place])   . Atrial fibrillation -parosysmal     Rx w amiodarone  . Automatic implantable cardiac defibrillator -BSX    single chamber  . CHF (congestive heart failure) ([place])   . Chronic systolic heart failure (HCC)    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%)  . Elevated LFTs   . GI bleed -massive    11 Units 2012  . Gout   . H/O hyperthyroidism   . Hypertension   . Noncompliance    H/O  MEDICAL NONCOMPLIANCE  . 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 Polymorphic ventricular tachycardia (HCC)    RECURRENT WITH APPROPRIATE SHOCK THERAPY IN THE PAST  . RA (rheumatoid arthritis) ([place])   . Severe mitral regurgitation   . Tricuspid valve regurgitation    SEVERE  . Ventricular fibrillation ([place])    WITH APPROPRIATE SHOCK THERAPY IN THE PAST    Current Outpatient Prescriptions  Medication Sig Dispense Refill  . allopurinol (ZYLOPRIM) 100 MG tablet Take 1 tablet (100 mg total) by mouth daily. 30 tablet 11  . amoxicillin (AMOXIL) 500 MG capsule Take 4 capsules (2,000 mg total) by mouth as directed. 1 hour prior to dental appointment 4 capsule 1  . citalopram (CELEXA) 20 MG  tablet Take 1 tablet (20 mg total) by mouth daily. 30 tablet 11  . ferrous sulfate 324 (65 Fe) MG TBEC Take 1 tablet (325 mg total) by mouth 2 (two) times daily. 60 tablet 5  . furosemide (LASIX) 40 MG tablet Take 1 tablet (40 mg total) by mouth daily as needed (weight gain or swelling). 30 tablet 6  . pregabalin (LYRICA) 75 MG capsule Take 75 mg by mouth daily.    . sacubitril-valsartan (ENTRESTO) 97-103 MG Take 1 tablet by mouth 2 (two) times daily. 60 tablet 11  . spironolactone (ALDACTONE) 25 MG tablet Take 0.5 tablets (12.5 mg total)  by mouth daily. 15 tablet 5  . tamsulosin (FLOMAX) 0.4 MG CAPS capsule Take 0.4 mg by mouth daily.    Marland Kitchen warfarin (COUMADIN) 4 MG tablet Take 6 mg by mouth daily.     Marland Kitchen zolpidem (AMBIEN) 10 MG tablet Take 10 mg by mouth at bedtime as needed for sleep.    . sildenafil (REVATIO) 20 MG tablet take 1 tablet by mouth three times a day 90 tablet 11   No current facility-administered medications for this encounter.     Phytonadione  REVIEW OF SYSTEMS: All systems negative except as listed in HPI, PMH and Problem list.  Vital Signs:  BP 116/71   Pulse 67   Ht 5\' 7"  (1.702 m)   Wt 202 lb 6.4 oz (91.8 kg)   BMI 31.70 kg/m    Vital Signs:  Doppler Pressure 108              Automatc BP: 116/71 (92) HR:67  SPO2:99  %  Weight: 202.4 lb w/o eqt Last weight: 197 lb Home weights: 198-203 lbs   Physical Exam: General:  NAD.  HEENT: normal  Neck: supple. JVP not elevated.  Carotids 2+ bilat; no bruits. No lymphadenopathy or thryomegaly appreciated. Cor: LVAD hum.  Lungs: Clear. Abdomen: obese soft, nontender, non-distended. No hepatosplenomegaly. No bruits or masses. Good bowel sounds. Driveline site clean. Anchor in place.  Extremities: no cyanosis, clubbing, rash. Warm no edema  Neuro: alert & oriented x 3. No focal deficits. Moves all 4 without problem    ASSESSMENT AND PLAN:   1. Chronic systolic HF due to NICM:  - Status post HMII LVAD implantation 12/20/14.Echo 1/18. Still with significant RV dysfunction and severe TR   - Doing well NYHA I. Volume status well managed with sliding scale diuretics - Now listed for transplant at [place] ([place] 7 at [facility]) - VAD parameters reviewed perosnally and are stable  2. RV failure:  Now off milrinone.   Off digoxin with renal failure.  - Stable. Continue sildenafil  3. HTN:  - BP improved. Continue current regimen. . - intolerant amlodipine due to leg edema.  4. Atrial fibrillation: .  --Rate controlled.  On coumadin for VAD. He is no  longer on amiodarone.  5. Anticoagulation management: INR goal 2.0-2.5, no aspirin. INR 2.23. Discussed with PharmD 6.  CKD- Stage III - stable 1.5-1.8 7. Driveline infection: wound completely healed. Has seen ID. Now off abx. Resolved.  8. Anemia -Hgb down 2 points today. 14-> 12. Recent epistaxis but no other bleeding. Repeat CBC and check iron stores on Friday 9. VAD - Parameters stable - LDH 262 - Listed for transplant - Driveline looks good.   Total time spent 35 minutes. Over half that time spent discussing above.   Glori Bickers, MD  9:13 PM

## 2017-02-02 NOTE — Progress Notes (Signed)
Patient presents for 2 month  follow up in [facility] today. Reports no problems with VAD equipment or concerns with drive line. State he forgot about his annual maintenance today, but will make plans to bring equipment at next appointment.   Vital Signs:  Doppler Pressure 108   Automatc BP: 116/71 (92) HR:67   SPO2:99  %  Weight: 202.4 lb w/o eqt Last weight: 197 lb Home weights: 198-203 lbs   VAD Indication: Bridge to Transplant- Listed 1B at [facility], status 7 at [facility]    VAD interrogation & Equipment Management: Speed:9200 Flow: 3.5 Power:4.7 w    PI:6.6  Alarms: no clinical alarms Events: 0-5 PI events daily  Fixed speed 9200 Low speed limit: 8600  Primary Controller:  Replace back up battery in 53months. Back up controller:   Replace back up battery in 7 months.  Annual Equipment Maintenance on UBC/PM was performed on 12/2015. Wil lmake plans to bring at next appointment.   I reviewed the LVAD parameters from today and compared the results to the patient's prior recorded data. LVAD interrogation was NEGATIVE for significant power changes, NEGATIVE for clinical alarms and STABLE for PI events/speed drops. No programming changes were made and pump is functioning within specified parameters. Pt is performing daily controller and system monitor self tests along with completing weekly and monthly maintenance for LVAD equipment.  LVAD equipment check completed and is in good working order. Back-up equipment present. Charged back up battery and performed self-test on equipment.   Exit Site Care: Drive line is being maintained weekly  by VAD Coordinators. Drive line exit site well healed and incorporated. The velour is fully implanted at exit site. Dressing dry and intact. No erythema or drainage. Stabilization device present and accurately applied. Pt denies fever or chills. Pt states they have adequate dressing supplies at home.   Significant Events on VAD Support:  08/2015  >> AKI requiring dialysis, Bactremia / VAD Pocket infection requiring debriedment. GIB  Device:BS Single Lead Therapies: on VF 220   BP & Labs:  MAP108 - Doppler is reflecting MAP  Hgb 11.9 - No S/S of bleeding. Specifically denies melena/BRBPR or nosebleeds. This is reduced from 14.3 one month ago. Plan to check iron stores at next visit.  LDH stable at 262 with established baseline of 230- 330. Denies tea-colored urine. No power elevations noted on interrogation.   Plan: 1. Check CBC, iron stores, and INR in 1 week. 2. RTC in 2 months for VAD visit.  Balinda Quails RN [place] Coordinator   Office: [phone] 24/7 Emergency VAD Pager: [phone]

## 2017-02-02 NOTE — Patient Instructions (Signed)
Return to clinic as scheduled!   You look great!

## 2017-02-05 ENCOUNTER — Other Ambulatory Visit (HOSPITAL_COMMUNITY): Payer: Self-pay | Admitting: Unknown Physician Specialty

## 2017-02-05 DIAGNOSIS — Z95811 Presence of heart assist device: Secondary | ICD-10-CM

## 2017-02-05 DIAGNOSIS — Z7901 Long term (current) use of anticoagulants: Secondary | ICD-10-CM

## 2017-02-10 ENCOUNTER — Ambulatory Visit (HOSPITAL_COMMUNITY): Payer: Self-pay | Admitting: Pharmacist

## 2017-02-10 ENCOUNTER — Ambulatory Visit (HOSPITAL_COMMUNITY)
Admission: RE | Admit: 2017-02-10 | Discharge: 2017-02-10 | Disposition: A | Discharge status: Home / Self Care | Payer: 59 | Source: Ambulatory Visit | Attending: Cardiology | Admitting: Cardiology

## 2017-02-10 DIAGNOSIS — Z4800 Encounter for change or removal of nonsurgical wound dressing: Secondary | ICD-10-CM | POA: Diagnosis not present

## 2017-02-10 DIAGNOSIS — Z95811 Presence of heart assist device: Secondary | ICD-10-CM | POA: Insufficient documentation

## 2017-02-10 DIAGNOSIS — Z7901 Long term (current) use of anticoagulants: Secondary | ICD-10-CM | POA: Diagnosis present

## 2017-02-10 LAB — CBC
HEMATOCRIT: 37.7 % — AB (ref 39.0–52.0)
HEMOGLOBIN: 11.8 g/dL — AB (ref 13.0–17.0)
MCH: 22.5 pg — AB (ref 26.0–34.0)
MCHC: 31.3 g/dL (ref 30.0–36.0)
MCV: 71.9 fL — AB (ref 78.0–100.0)
Platelets: 119 10*3/uL — ABNORMAL LOW (ref 150–400)
RBC: 5.24 MIL/uL (ref 4.22–5.81)
RDW: 18.9 % — AB (ref 11.5–15.5)
WBC: 3.7 10*3/uL — ABNORMAL LOW (ref 4.0–10.5)

## 2017-02-10 LAB — PROTIME-INR
INR: 2.52
Prothrombin Time: 27 seconds — ABNORMAL HIGH (ref 11.4–15.2)

## 2017-02-10 LAB — IRON AND TIBC
Iron: 81 ug/dL (ref 45–182)
Saturation Ratios: 25 % (ref 17.9–39.5)
TIBC: 325 ug/dL (ref 250–450)
UIBC: 244 ug/dL

## 2017-02-10 NOTE — Addendum Note (Signed)
Encounter addended by: Lezlie Octave, RN on: 02/10/2017  2:49 PM<BR>    Actions taken: Sign clinical note

## 2017-02-10 NOTE — Progress Notes (Signed)
Patient presents to clinic today for drive line exit wound care. Existing VAD dressing removed and site care performed using sterile technique. Drive line exit site cleaned with Chlora prep applicators x 2, allowed to dry, and Sorbaview dressing with bio patch re-applied; all covered with Tegaderm. Exit site healed and incorporated, the velour is fully implanted at exit site. No redness, tenderness, drainage, foul odor or rash noted. Drive line anchor re-applied. Pt denies fever or chills.   Return in 1 week for another dressing change per standard of care. INR to be repeated in 1-2 weeks pending results per anticoagulation protocol.   Zada Girt RN, VAD Coordinator 24/7 pager [phone]

## 2017-02-11 ENCOUNTER — Other Ambulatory Visit (HOSPITAL_COMMUNITY): Payer: Self-pay | Admitting: *Deleted

## 2017-02-11 DIAGNOSIS — Z95811 Presence of heart assist device: Secondary | ICD-10-CM

## 2017-02-18 ENCOUNTER — Ambulatory Visit (HOSPITAL_COMMUNITY)
Admission: RE | Admit: 2017-02-18 | Discharge: 2017-02-18 | Disposition: A | Discharge status: Home / Self Care | Payer: 59 | Source: Ambulatory Visit | Attending: Internal Medicine | Admitting: Internal Medicine

## 2017-02-18 ENCOUNTER — Ambulatory Visit (HOSPITAL_COMMUNITY): Payer: Self-pay | Admitting: Pharmacist

## 2017-02-18 DIAGNOSIS — Z7901 Long term (current) use of anticoagulants: Secondary | ICD-10-CM | POA: Insufficient documentation

## 2017-02-18 DIAGNOSIS — Z95811 Presence of heart assist device: Secondary | ICD-10-CM | POA: Diagnosis present

## 2017-02-18 LAB — PROTIME-INR
INR: 2.25
Prothrombin Time: 24.7 seconds — ABNORMAL HIGH (ref 11.4–15.2)

## 2017-02-18 NOTE — Progress Notes (Signed)
Patient presents to clinic today for drive line exit wound care. Existing VAD dressing removed and site care performed using sterile technique. Drive line exit site cleaned with Chlora prep applicators x 2, allowed to dry, and Sorbaview dressing with bio patch re-applied; all covered with Tegaderm. Exit site healed and incorporated, the velour is fully implanted at exit site. No redness, tenderness, drainage, foul odor or rash noted. Drive line anchor re-applied. Pt denies fever or chills.   Return in 1 week for another dressing change per standard of care. INR to be repeated in 1-2 weeks pending results per anticoagulation protocol.   Zada Girt RN, VAD Coordinator 24/7 pager [phone]

## 2017-02-18 NOTE — Addendum Note (Signed)
Encounter addended by: Lezlie Octave, RN on: 02/18/2017  2:55 PM<BR>    Actions taken: Sign clinical note

## 2017-02-19 ENCOUNTER — Other Ambulatory Visit (HOSPITAL_COMMUNITY): Payer: Self-pay | Admitting: Unknown Physician Specialty

## 2017-02-19 DIAGNOSIS — Z95811 Presence of heart assist device: Secondary | ICD-10-CM

## 2017-02-19 DIAGNOSIS — Z7901 Long term (current) use of anticoagulants: Secondary | ICD-10-CM

## 2017-02-25 ENCOUNTER — Ambulatory Visit (HOSPITAL_COMMUNITY): Payer: Self-pay | Admitting: *Deleted

## 2017-02-25 ENCOUNTER — Ambulatory Visit (HOSPITAL_COMMUNITY)
Admission: RE | Admit: 2017-02-25 | Discharge: 2017-02-25 | Disposition: A | Discharge status: Home / Self Care | Payer: 59 | Source: Ambulatory Visit | Attending: Internal Medicine | Admitting: Internal Medicine

## 2017-02-25 DIAGNOSIS — Z95811 Presence of heart assist device: Secondary | ICD-10-CM | POA: Diagnosis not present

## 2017-02-25 DIAGNOSIS — Z4509 Encounter for adjustment and management of other cardiac device: Secondary | ICD-10-CM | POA: Diagnosis not present

## 2017-02-25 DIAGNOSIS — Z7901 Long term (current) use of anticoagulants: Secondary | ICD-10-CM | POA: Diagnosis not present

## 2017-02-25 LAB — PROTIME-INR
INR: 1.71
PROTHROMBIN TIME: 19.9 s — AB (ref 11.4–15.2)

## 2017-02-25 NOTE — Addendum Note (Signed)
Encounter addended by: Lezlie Octave, RN on: 02/25/2017 2:12 PM  Actions taken: Sign clinical note

## 2017-02-25 NOTE — Progress Notes (Signed)
Patient presents to clinic today for drive line exit wound care. Existing VAD dressing removed and site care performed using sterile technique. Drive line exit site cleaned with Chlora prep applicators x 2, allowed to dry, and Sorbaview dressing with bio patch re-applied; all covered with Tegaderm. Exit site healed and incorporated, the velour is fully implanted at exit site. No redness, tenderness, drainage, foul odor or rash noted. Drive line anchor re-applied. Pt denies fever or chills.   Return in 1 week for another dressing change per standard of care. INR to be repeated in 1-2 weeks pending results per anticoagulation protocol.   Zada Girt RN, VAD Coordinator 24/7 pager [phone]

## 2017-02-26 ENCOUNTER — Other Ambulatory Visit (HOSPITAL_COMMUNITY): Payer: Self-pay | Admitting: Unknown Physician Specialty

## 2017-02-26 DIAGNOSIS — Z95811 Presence of heart assist device: Secondary | ICD-10-CM

## 2017-02-26 DIAGNOSIS — Z7901 Long term (current) use of anticoagulants: Secondary | ICD-10-CM

## 2017-03-02 ENCOUNTER — Telehealth: Payer: Self-pay | Admitting: Hematology

## 2017-03-02 ENCOUNTER — Ambulatory Visit (HOSPITAL_BASED_OUTPATIENT_CLINIC_OR_DEPARTMENT_OTHER): Payer: 59 | Admitting: Hematology

## 2017-03-02 ENCOUNTER — Encounter: Payer: Self-pay | Admitting: Hematology

## 2017-03-02 ENCOUNTER — Other Ambulatory Visit (HOSPITAL_BASED_OUTPATIENT_CLINIC_OR_DEPARTMENT_OTHER): Payer: 59

## 2017-03-02 VITALS — BP 109/88 | HR 82 | Temp 98.0°F | Resp 18 | Ht 67.0 in | Wt 207.3 lb

## 2017-03-02 DIAGNOSIS — D72819 Decreased white blood cell count, unspecified: Secondary | ICD-10-CM

## 2017-03-02 DIAGNOSIS — D709 Neutropenia, unspecified: Secondary | ICD-10-CM

## 2017-03-02 DIAGNOSIS — D696 Thrombocytopenia, unspecified: Secondary | ICD-10-CM | POA: Diagnosis not present

## 2017-03-02 DIAGNOSIS — D649 Anemia, unspecified: Secondary | ICD-10-CM

## 2017-03-02 LAB — CBC & DIFF AND RETIC
BASO%: 0.7 % (ref 0.0–2.0)
Basophils Absolute: 0 10*3/uL (ref 0.0–0.1)
EOS%: 1.6 % (ref 0.0–7.0)
Eosinophils Absolute: 0.1 10*3/uL (ref 0.0–0.5)
HEMATOCRIT: 40.5 % (ref 38.4–49.9)
HGB: 12.5 g/dL — ABNORMAL LOW (ref 13.0–17.1)
Immature Retic Fract: 3.6 % (ref 3.00–10.60)
LYMPH%: 48.4 % (ref 14.0–49.0)
MCH: 23 pg — AB (ref 27.2–33.4)
MCHC: 30.9 g/dL — AB (ref 32.0–36.0)
MCV: 74.6 fL — ABNORMAL LOW (ref 79.3–98.0)
MONO#: 0.2 10*3/uL (ref 0.1–0.9)
MONO%: 6.6 % (ref 0.0–14.0)
NEUT%: 42.7 % (ref 39.0–75.0)
NEUTROS ABS: 1.3 10*3/uL — AB (ref 1.5–6.5)
Platelets: 106 10*3/uL — ABNORMAL LOW (ref 140–400)
RBC: 5.43 10*6/uL (ref 4.20–5.82)
RDW: 18.9 % — AB (ref 11.0–14.6)
RETIC %: 1.24 % (ref 0.80–1.80)
Retic Ct Abs: 67.33 10*3/uL (ref 34.80–93.90)
WBC: 3 10*3/uL — AB (ref 4.0–10.3)
lymph#: 1.5 10*3/uL (ref 0.9–3.3)

## 2017-03-02 LAB — COMPREHENSIVE METABOLIC PANEL
ALT: 19 U/L (ref 0–55)
ANION GAP: 4 meq/L (ref 3–11)
AST: 19 U/L (ref 5–34)
Albumin: 3.5 g/dL (ref 3.5–5.0)
Alkaline Phosphatase: 74 U/L (ref 40–150)
BILIRUBIN TOTAL: 0.52 mg/dL (ref 0.20–1.20)
BUN: 33.1 mg/dL — ABNORMAL HIGH (ref 7.0–26.0)
CALCIUM: 8.5 mg/dL (ref 8.4–10.4)
CO2: 21 mEq/L — ABNORMAL LOW (ref 22–29)
CREATININE: 1.8 mg/dL — AB (ref 0.7–1.3)
Chloride: 115 mEq/L — ABNORMAL HIGH (ref 98–109)
EGFR: 50 mL/min/{1.73_m2} — ABNORMAL LOW (ref >60)
Glucose: 90 mg/dl (ref 70–140)
Potassium: 5 mEq/L (ref 3.5–5.1)
Sodium: 140 mEq/L (ref 136–145)
TOTAL PROTEIN: 7.9 g/dL (ref 6.4–8.3)

## 2017-03-02 NOTE — Telephone Encounter (Signed)
Gave avs and calendar for May 2019 °

## 2017-03-02 NOTE — Patient Instructions (Signed)
Thank you for choosing [facility] to provide your oncology and hematology care.  To afford each patient quality time with our providers, please arrive 30 minutes before your scheduled appointment time.  If you arrive late for your appointment, you may be asked to reschedule.  We strive to give you quality time with our providers, and arriving late affects you and other patients whose appointments are after yours.   If you are a no show for multiple scheduled visits, you may be dismissed from the clinic at the providers discretion.    Again, thank you for choosing [facility], our hope is that these requests will decrease the amount of time that you wait before being seen by our physicians.  ______________________________________________________________________  Should you have questions after your visit to the [facility], please contact our office at [phone] between the hours of 8:30 and 4:30 p.m.    Voicemails left after 4:30p.m will not be returned until the following business day.    For prescription refill requests, please have your pharmacy contact us directly.  Please also try to allow 48 hours for prescription requests.    Please contact the scheduling department for questions regarding scheduling.  For scheduling of procedures such as PET scans, CT scans, MRI, Ultrasound, etc please contact central scheduling at [phone].    Resources For Cancer Patients and Caregivers:   Oncolink.org:  A wonderful resource for patients and healthcare providers for information regarding your disease, ways to tract your treatment, what to expect, etc.     American Cancer Society:  800-227-2345  Can help patients locate various types of support and financial assistance  Cancer Care: 1-800-813-HOPE (4673) Provides financial assistance, online support groups, medication/co-pay assistance.    [place] DSS:  336-641-3447 Where to apply for food  stamps, Medicaid, and utility assistance  Medicare Rights Center: 800-333-4114 Helps people with Medicare understand their rights and benefits, navigate the Medicare system, and secure the quality healthcare they deserve  SCAT: 336-333-6589 [employer]'s shared-ride transportation service for eligible riders who have a disability that prevents them from riding the fixed route bus.    For additional information on assistance programs please contact our social worker:   Grier Hock/Abigail Elmore:  [phone]            

## 2017-03-04 ENCOUNTER — Other Ambulatory Visit (HOSPITAL_COMMUNITY): Payer: 59

## 2017-03-05 ENCOUNTER — Ambulatory Visit (HOSPITAL_COMMUNITY): Payer: Self-pay | Admitting: Pharmacist

## 2017-03-05 ENCOUNTER — Ambulatory Visit (HOSPITAL_COMMUNITY)
Admission: RE | Admit: 2017-03-05 | Discharge: 2017-03-05 | Disposition: A | Discharge status: Home / Self Care | Payer: 59 | Source: Ambulatory Visit | Attending: Cardiology | Admitting: Cardiology

## 2017-03-05 DIAGNOSIS — Z95811 Presence of heart assist device: Secondary | ICD-10-CM

## 2017-03-05 DIAGNOSIS — Z7901 Long term (current) use of anticoagulants: Secondary | ICD-10-CM

## 2017-03-05 LAB — PROTIME-INR
INR: 2.49
Prothrombin Time: 26.7 seconds — ABNORMAL HIGH (ref 11.4–15.2)

## 2017-03-05 NOTE — Progress Notes (Signed)
Patient presents to clinic today for drive line exit wound care. Existing VAD dressing removed and site care performed using sterile technique. Drive line exit site cleaned with Chlora prep applicators x 2, allowed to dry, and Sorbaview dressing with bio patch re-applied. Exit site healed and incorporated, the velour is fully implanted at exit site. No redness, tenderness, drainage, foul odor or rash noted. Drive line anchor re-applied. Pt denies fever or chills.   Return in 1 week for another dressing change per standard of care. INR to be repeated in 1-2 weeks pending results per anticoagulation protocol.   Balinda Quails RN, VAD Coordinator 24/7 pager [phone]

## 2017-03-08 NOTE — Progress Notes (Signed)
Marland Kitchen    HEMATOLOGY/ONCOLOGY CLINIC NOTE  Date of Service: .03/02/2017  Patient Care Team: Sandi Mariscal, MD as PCP - General (Internal Medicine)  CHIEF COMPLAINTS/PURPOSE OF CONSULTATION:  Neutropenia  HISTORY OF PRESENTING ILLNESS:   [name] is a wonderful 46 y.o. male who has been referred to [place] by Dr .Sandi Mariscal, MD  for evaluation and management of neutropenia.  Patient has a history of multiple medical comorbidities including hypertension, dyslipidemia, chronic systolic CHF status post LVAD, gout on allopurinol, elevated LFTs, atrial fibrillation-paroxysmal-on anticoagulation with warfarin, history of ventricular fibrillation, chronic kidney disease and rheumatoid arthritis.  Patient had labs with his primary care physician that showed mild leukopenia with gradually decreasing WBC count. He was noted to have a WBC count of 4.9k on 08/01/2016 --> 3.7k on 10/06/2016 and down to 3.2k on labs today 11/03/2016.  Patient's labs today show a normal hemoglobin of 14 (up from 9.6 in 4/18) with an MCV of 71 and normal platelet count of 148k (up from 126k in 4/18).  He is in the process of being evaluated for cardiac transplantation at [facility].  Labs today his WBC count is 3.2k with a normal ANC of 1600. No issues with infections. Is on multiple medications which could potentially do this. No recent overt viral illness. No new bone pains fevers or chills. Has rheumatoid arthritis which he notes has been stable.  INTERVAL HISTORY  Patient is here for followup of his neutropenia and thrombocytopenia.  His counts have remained relatively stable with an improvement in his hemoglobin to 12.5, platelets of 106k and WBC count of 3k with an ANC of 1300.  He reports no new significant medications.  Reports that he has been placed on the cardiac transplant list at [facility]. Overall has been doing well and has not had any issues with infections or febrile illnesses.  MEDICAL HISTORY:  Past Medical History:    Diagnosis Date  . AICD (automatic cardioverter/defibrillator) present   . AKI (acute kidney injury) ([place])   . Atrial fibrillation -parosysmal     Rx w amiodarone  . Automatic implantable cardiac defibrillator -BSX    single chamber  . CHF (congestive heart failure) ([place])   . Chronic systolic heart failure (HCC)    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%)  . Elevated LFTs   . GI bleed -massive    11 Units 2012  . Gout   . H/O hyperthyroidism   . Hypertension   . Noncompliance    H/O  MEDICAL NONCOMPLIANCE  . 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 Polymorphic ventricular tachycardia (HCC)    RECURRENT WITH APPROPRIATE SHOCK THERAPY IN THE PAST  . RA (rheumatoid arthritis) ([place])   . Severe mitral regurgitation   . Tricuspid valve regurgitation    SEVERE  . Ventricular fibrillation ([place])    WITH APPROPRIATE SHOCK THERAPY IN THE PAST    SURGICAL HISTORY: Past Surgical History:  Procedure Laterality Date  . ABDOMINAL WOUND CLOSURE WITH INCISIONAL VAC APPLICATION N/A 04/28/3233   Performed by Ivin Poot, MD at [place]  . APPLICATION OF A-CELL OF sternum N/A 09/05/2015   Performed by Wallace Going, DO at [place]  . APPLICATION OF WOUND VAC N/A 08/27/2015   Performed by Ivin Poot, MD at [place]  . APPLICATION OF WOUND VAC to sternum N/A 09/05/2015   Performed by Wallace Going, DO at [facility] OR  . CANCELLED PROCEDURE  12/20/2015   Performed by Jolaine Artist, MD at [facility] ENDOSCOPY  . CARDIAC CATHETERIZATION  06/2006   RIGHT HEART CATH SHOWING SEVERE BIVENTRICUALR CHF WITH MARKED FILLING AND PRESSURES  . CHOLECYSTECTOMY    . DEBRIDEMENT AND CLOSURE WOUND WITH PLACEMENT OF ABRA CLOSURE DEVICE N/A 09/09/2015   Performed by Wallace Going, DO at [place]  . DEBRIDEMENT OF chest wound N/A 08/29/2015   Performed by Prescott Gum, Collier [name], MD at [place]  . ESOPHAGOGASTRODUODENOSCOPY (EGD) N/A 09/17/2015   Performed by Carol Ada, MD at [place]  . IABP Insertion N/A 12/14/2014   Performed by Jolaine Artist, MD at [place] CV LAB  . IMPLANTABLE CARDIOVERTER DEFIBRILLATOR GENERATOR CHANGE N/A 07/01/2011   Performed by Deboraha Sprang, MD at [facility] CATH LAB  . INSERT / REPLACE / REMOVE PACEMAKER     GUIDANT HE ICD MODEL 2180, SERIAL # [id number]  . INSERTION OF DIALYSIS CATHETER;RIGHT SUBCLAVIAN Right 09/05/2015   Performed by Wallace Going, DO at [facility] OR  . INSERTION OF IMPLANTABLE LEFT VENTRICULAR ASSIST DEVICE N/A 12/20/2014   Performed by Ivin Poot, MD at [place]  . IRRIGATION AND DEBRIDEMENT ABDOMINAL WOUND WITH APPLICATION OF ACELL N/A 09/03/2015   Performed by Ivin Poot, MD at [place]  . IRRIGATION AND DEBRIDEMENT sternal WOUND N/A 09/05/2015   Performed by Wallace Going, DO at [place]  . Right Heart Cath N/A 12/18/2015   Performed by Jolaine Artist, MD at [place] CV LAB  . Right Heart Cath N/A 12/14/2014   Performed by Jolaine Artist, MD at [place] CV LAB  . TRANSESOPHAGEAL ECHOCARDIOGRAM (TEE) N/A 12/20/2014   Performed by Ivin Poot, MD at [place]  . TRANSESOPHAGEAL ECHOCARDIOGRAM (TEE) N/A 12/12/2014   Performed by Jerline Pain, MD at [place]  . WOUND IRRIGATION AND DEBRIDEMENT N/A 08/27/2015   Performed by Ivin Poot, MD at [place] N/A 08/29/2015   Performed by Ivin Poot, MD at [facility] OR    SOCIAL HISTORY: Social History   Socioeconomic History  . Marital status: Single    Spouse name: Not on file  . Number of children: Not on file  . Years of education: Not on file  . Highest education level: Not on file  Social Needs  . Financial resource strain: Not on file  . Food insecurity - worry: Not on file  . Food insecurity - inability: Not on file  . Transportation needs - medical: Not on file  . Transportation needs - non-medical: Not on file  Occupational History  . Occupation: TAX COLLECTOR    Employer: [employer]    Tobacco Use  . Smoking status: Never Smoker  . Smokeless tobacco: Never Used  Substance and Sexual Activity  . Alcohol use: No  . Drug use: No  . Sexual activity: Not on file  Other Topics Concern  . Not on file  Social History Narrative   LIVES IN [place]   SINGLE   TOBACCO USE .Marland KitchenMarland KitchenNO   ALCOHOL USE.Marland Kitchen NO   REGULAR EXERCISE.Marland Kitchen NO   DRUG USE... NO    FAMILY HISTORY: Family History  Problem Relation Age of Onset  . Heart failure Brother     ALLERGIES:  is allergic to phytonadione.  MEDICATIONS:  Current Outpatient Medications  Medication Sig Dispense Refill  . allopurinol (ZYLOPRIM) 100 MG tablet Take 1 tablet (100 mg total) by mouth  daily. 30 tablet 11  . amoxicillin (AMOXIL) 500 MG capsule Take 4 capsules (2,000 mg total) by mouth as directed. 1 hour prior to dental appointment 4 capsule 1  . citalopram (CELEXA) 20 MG tablet Take 1 tablet (20 mg total) by mouth daily. 30 tablet 11  . ferrous sulfate 324 (65 Fe) MG TBEC Take 1 tablet (325 mg total) by mouth 2 (two) times daily. 60 tablet 5  . furosemide (LASIX) 40 MG tablet Take 1 tablet (40 mg total) by mouth daily as needed (weight gain or swelling). 30 tablet 6  . pregabalin (LYRICA) 75 MG capsule Take 75 mg by mouth daily.    . sacubitril-valsartan (ENTRESTO) 97-103 MG Take 1 tablet by mouth 2 (two) times daily. 60 tablet 11  . sildenafil (REVATIO) 20 MG tablet take 1 tablet by mouth three times a day 90 tablet 11  . spironolactone (ALDACTONE) 25 MG tablet Take 0.5 tablets (12.5 mg total) by mouth daily. 15 tablet 5  . tamsulosin (FLOMAX) 0.4 MG CAPS capsule Take 0.4 mg by mouth daily.    Marland Kitchen warfarin (COUMADIN) 4 MG tablet Take 6 mg by mouth daily.     Marland Kitchen zolpidem (AMBIEN) 10 MG tablet Take 10 mg by mouth at bedtime as needed for sleep.     No current facility-administered medications for this visit.     REVIEW OF SYSTEMS:    10 Point review of Systems was done is negative except as noted above.  PHYSICAL  EXAMINATION: ECOG PERFORMANCE STATUS: 2 - Symptomatic, <50% confined to bed  . Vitals:   03/02/17 1036  BP: 109/88  Pulse: 82  Resp: 18  Temp: 98 F (36.7 C)  SpO2: 100%   Filed Weights   03/02/17 1036  Weight: 207 lb 4.8 oz (94 kg)   .Body mass index is 32.47 kg/m.  GENERAL:alert, in no acute distress and comfortable SKIN: no acute rashes, no significant lesions EYES: conjunctiva are pink and non-injected, sclera anicteric OROPHARYNX: MMM, no exudates, no oropharyngeal erythema or ulceration NECK: supple, no JVD LYMPH:  no palpable lymphadenopathy in the cervical, axillary or inguinal regions LUNGS: clear to auscultation b/l with normal respiratory effort HEART:irreg ABDOMEN:  normoactive bowel sounds , non tender, not distended. Extremity: no pedal edema PSYCH: alert & oriented x 3 with fluent speech NEURO: no focal motor/sensory deficits  LABORATORY DATA:  I have reviewed the data as listed  . CBC Latest Ref Rng & Units 03/02/2017 02/10/2017 02/02/2017  WBC 4.0 - 10.3 10e3/uL 3.0(L) 3.7(L) 3.7(L)  Hemoglobin 13.0 - 17.1 g/dL 12.5(L) 11.8(L) 11.9(L)  Hematocrit 38.4 - 49.9 % 40.5 37.7(L) 38.2(L)  Platelets 140 - 400 10e3/uL 106(L) 119(L) 134(L)   . CBC    Component Value Date/Time   WBC 3.0 (L) 03/02/2017 1025   WBC 3.7 (L) 02/10/2017 1317   RBC 5.43 03/02/2017 1025   RBC 5.24 02/10/2017 1317   HGB 12.5 (L) 03/02/2017 1025   HCT 40.5 03/02/2017 1025   PLT 106 (L) 03/02/2017 1025   MCV 74.6 (L) 03/02/2017 1025   MCH 23.0 (L) 03/02/2017 1025   MCH 22.5 (L) 02/10/2017 1317   MCHC 30.9 (L) 03/02/2017 1025   MCHC 31.3 02/10/2017 1317   RDW 18.9 (H) 03/02/2017 1025   LYMPHSABS 1.5 03/02/2017 1025   MONOABS 0.2 03/02/2017 1025   EOSABS 0.1 03/02/2017 1025   BASOSABS 0.0 03/02/2017 1025    . CMP Latest Ref Rng & Units 03/02/2017 02/02/2017 12/09/2016  Glucose 70 - 140 mg/dl  90 99 92  BUN 7.0 - 26.0 mg/dL 33.1(H) 23(H) 24(H)  Creatinine 0.7 - 1.3 mg/dL  1.8(H) 1.83(H) 1.86(H)  Sodium 136 - 145 mEq/L 140 137 138  Potassium 3.5 - 5.1 mEq/L 5.0 4.5 4.4  Chloride 101 - 111 mmol/L - 111 108  CO2 22 - 29 mEq/L 21(L) 21(L) 23  Calcium 8.4 - 10.4 mg/dL 8.5 8.5(L) 8.4(L)  Total Protein 6.4 - 8.3 g/dL 7.9 7.6 -  Total Bilirubin 0.20 - 1.20 mg/dL 0.52 0.6 -  Alkaline Phos 40 - 150 U/L 74 96 -  AST 5 - 34 U/L 19 27 -  ALT 0 - 55 U/L 19 20 -   Component     Latest Ref Rng & Units 11/03/2016  Iron     42 - 163 ug/dL 98  TIBC     202 - 409 ug/dL 333  UIBC     117 - 376 ug/dL 234  %SAT     20 - 55 % 30  Folate, Hemolysate     Not Estab. ng/mL 327.8  HCT     37.5 - 51.0 % 45.2  Folate, RBC     >498 ng/mL 725  Vitamin B12     232 - 1,245 pg/mL 462  Ferritin     22 - 316 ng/ml 96  Hep C Virus Ab     0.0 - 0.9 s/co ratio <0.1    RADIOGRAPHIC STUDIES: I have personally reviewed the radiological images as listed and agreed with the findings in the report. No results found.  ASSESSMENT & PLAN:   46 yo male with   1) Mild Leukopenia with neutropenia 2) mild thrombocytopenia  Unclear etiology Hep C neg B21/RBC folate WNL Likely related to medications - ACEI/lasix etc which was essential at this time. Cannot r/o autoimmune etiology in conjunction with his RA [place] abd on 02/12/2016 - showed no overt liver disease or splenomegaly. Plan -labs reviewed from today with the patient. Stable counts with no clinical changes and no febrile illness. -no indication for G-CSF -patients cardiac health has been his primary concern currently and given significant co-morbidity we discussed and decided to hold off on  BM Bx again at this time.  -lab stability suggests no rapidly progressive pathology.  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 Lab Results  Component Value Date   IRON 81 02/10/2017   TIBC 325 02/10/2017   IRONPCTSAT 25 02/10/2017   (Iron and TIBC)  Lab Results    Component Value Date   FERRITIN 96 11/03/2016   3). Patient Active Problem List   Diagnosis Date Noted  . PAF (paroxysmal atrial fibrillation) ([place]) 11/18/2015  . Wound infection   . Debility 09/25/2015  . Debilitated 09/25/2015  . Chronic systolic congestive heart failure ([place])   . Colitis   . Septic shock ([place])   . Enteritis due to Clostridium difficile   . Staphylococcus aureus bacteremia   . 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[place])   . Abscess of abdominal wall   . Acute on chronic systolic (congestive) heart failure ([place])   . Left ventricular assist device (LVAD) complication 02/03/5101  . Epistaxis 05/21/2015  . Situational depression 04/13/2015  . Acute gout 01/15/2015  . Palliative care encounter 12/17/2014  . Cardiogenic shock ([place])   . Acute on chronic systolic CHF (congestive heart failure) ([place])   . Acute on chronic renal failure ([place])   . Chronic atrial  fibrillation ([place])   . Low output heart failure ([place]) 12/14/2014  . Acute abdominal pain   . Near syncope   . Hypotension 11/30/2014  . AKI (acute kidney injury) ([place])   . Abnormal LFTs 10/26/2012  . Hyperthyroidism 05/26/2011  . 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 ICD-[employer] 07/22/2010  . Nonischemic cardiomyopathy ([place]) 07/22/2010  . Essential hypertension, benign 11/21/2008  . SYSTOLIC HEART FAILURE, CHRONIC 10/23/2008   -patient notes that he is on the cardiac transplant list. Labs today RTC with Dr Irene Limbo in 4 months with labs  All of the patients questions were answered with apparent satisfaction. The patient knows to call the clinic with any problems, questions or concerns.  I spent 20 minutes counseling the patient face to face. The total time spent in the appointment was 25 minutes and more than 50% was on counseling and direct patient cares.    Sullivan Lone MD [place] AAHIVMS [facility] Hematology/Oncology Physician [facility]  (Office):        [phone] (Work cell):  [phone] (Fax):           [phone]

## 2017-03-10 ENCOUNTER — Other Ambulatory Visit (HOSPITAL_COMMUNITY): Payer: Self-pay | Admitting: Unknown Physician Specialty

## 2017-03-10 DIAGNOSIS — Z95811 Presence of heart assist device: Secondary | ICD-10-CM

## 2017-03-10 DIAGNOSIS — Z7901 Long term (current) use of anticoagulants: Secondary | ICD-10-CM

## 2017-03-16 ENCOUNTER — Ambulatory Visit (HOSPITAL_COMMUNITY): Payer: Self-pay | Admitting: Pharmacist

## 2017-03-16 ENCOUNTER — Ambulatory Visit (HOSPITAL_COMMUNITY)
Admission: RE | Admit: 2017-03-16 | Discharge: 2017-03-16 | Disposition: A | Discharge status: Home / Self Care | Payer: 59 | Source: Ambulatory Visit | Attending: Internal Medicine | Admitting: Internal Medicine

## 2017-03-16 DIAGNOSIS — Z7901 Long term (current) use of anticoagulants: Secondary | ICD-10-CM | POA: Diagnosis present

## 2017-03-16 DIAGNOSIS — Z95811 Presence of heart assist device: Secondary | ICD-10-CM

## 2017-03-16 LAB — PROTIME-INR
INR: 2.35
Prothrombin Time: 25.5 seconds — ABNORMAL HIGH (ref 11.4–15.2)

## 2017-03-16 NOTE — Progress Notes (Signed)
Patient presents to clinic today for drive line exit wound care. Existing VAD dressing removed and site care performed using sterile technique. Drive line exit site cleaned with Chlora prep applicators x 2, allowed to dry, and Sorbaview dressing with bio patch re-applied. Exit site healed and incorporated, the velour is fully implanted at exit site. No redness, tenderness, drainage, foul odor or rash noted. Drive line anchor re-applied. Pt denies fever or chills.   Return in 1 week for another dressing change per standard of care. INR to be repeated in 1-2 weeks pending results per anticoagulation protocol.   Balinda Quails RN, VAD Coordinator 24/7 pager [phone]

## 2017-03-16 NOTE — Addendum Note (Signed)
Encounter addended by: Candy Sledge, RN on: 03/16/2017 1:25 PM  Actions taken: Sign clinical note

## 2017-03-18 ENCOUNTER — Other Ambulatory Visit (HOSPITAL_COMMUNITY): Payer: Self-pay | Admitting: *Deleted

## 2017-03-18 DIAGNOSIS — Z95811 Presence of heart assist device: Secondary | ICD-10-CM

## 2017-03-18 DIAGNOSIS — Z7901 Long term (current) use of anticoagulants: Secondary | ICD-10-CM

## 2017-03-25 ENCOUNTER — Ambulatory Visit (HOSPITAL_COMMUNITY)
Admission: RE | Admit: 2017-03-25 | Discharge: 2017-03-25 | Disposition: A | Discharge status: Home / Self Care | Payer: 59 | Source: Ambulatory Visit | Attending: Internal Medicine | Admitting: Internal Medicine

## 2017-03-25 ENCOUNTER — Ambulatory Visit (HOSPITAL_COMMUNITY): Payer: Self-pay | Admitting: Pharmacist

## 2017-03-25 DIAGNOSIS — Z7901 Long term (current) use of anticoagulants: Secondary | ICD-10-CM

## 2017-03-25 DIAGNOSIS — Z95811 Presence of heart assist device: Secondary | ICD-10-CM

## 2017-03-25 LAB — PROTIME-INR
INR: 2.25
Prothrombin Time: 24.7 seconds — ABNORMAL HIGH (ref 11.4–15.2)

## 2017-03-25 NOTE — Progress Notes (Signed)
Patient presents to clinic today for drive line exit wound care. Existing VAD dressing removed and site care performed using sterile technique. Drive line exit site cleaned with Chlora prep applicators x 2, allowed to dry, and Sorbaview dressing with bio patch re-applied. Dressing covered with large Tegaderm dressings. Exit site healed and incorporated, the velour is fully implanted at exit site. No redness, tenderness, drainage, foul odor or rash noted. Drive line anchor re-applied. Pt denies fever or chills.   Return in 1 week for another dressing change per standard of care. INR to be repeated in 1-2 weeks pending results per anticoagulation protocol.   Zada Girt RN, VAD Coordinator 24/7 pager [phone]

## 2017-03-25 NOTE — Addendum Note (Signed)
Encounter addended by: Lezlie Octave, RN on: 03/25/2017 2:11 PM  Actions taken: Sign clinical note

## 2017-03-26 ENCOUNTER — Other Ambulatory Visit (HOSPITAL_COMMUNITY): Payer: Self-pay | Admitting: Unknown Physician Specialty

## 2017-03-26 DIAGNOSIS — Z7901 Long term (current) use of anticoagulants: Secondary | ICD-10-CM

## 2017-03-26 DIAGNOSIS — Z95811 Presence of heart assist device: Secondary | ICD-10-CM

## 2017-04-01 ENCOUNTER — Ambulatory Visit (HOSPITAL_COMMUNITY)
Admission: RE | Admit: 2017-04-01 | Discharge: 2017-04-01 | Disposition: A | Discharge status: Home / Self Care | Payer: 59 | Source: Ambulatory Visit | Attending: Cardiology | Admitting: Cardiology

## 2017-04-01 DIAGNOSIS — Z95811 Presence of heart assist device: Secondary | ICD-10-CM | POA: Diagnosis present

## 2017-04-01 DIAGNOSIS — Z7901 Long term (current) use of anticoagulants: Secondary | ICD-10-CM | POA: Diagnosis not present

## 2017-04-02 ENCOUNTER — Other Ambulatory Visit (HOSPITAL_COMMUNITY): Payer: Self-pay | Admitting: *Deleted

## 2017-04-02 DIAGNOSIS — R339 Retention of urine, unspecified: Secondary | ICD-10-CM

## 2017-04-02 MED ORDER — TAMSULOSIN HCL 0.4 MG PO CAPS: 0.4000 mg | ORAL_CAPSULE | Freq: Every day | ORAL | 11 refills | Status: DC

## 2017-04-07 ENCOUNTER — Other Ambulatory Visit (HOSPITAL_COMMUNITY): Payer: Self-pay | Admitting: *Deleted

## 2017-04-07 ENCOUNTER — Encounter (HOSPITAL_COMMUNITY): Payer: Self-pay

## 2017-04-07 ENCOUNTER — Ambulatory Visit (HOSPITAL_COMMUNITY)
Admission: RE | Admit: 2017-04-07 | Discharge: 2017-04-07 | Disposition: A | Discharge status: Home / Self Care | Payer: 59 | Source: Ambulatory Visit | Attending: Cardiology | Admitting: Cardiology

## 2017-04-07 ENCOUNTER — Ambulatory Visit (HOSPITAL_COMMUNITY): Payer: Self-pay | Admitting: Pharmacist

## 2017-04-07 VITALS — BP 120/81 | HR 78 | Resp 16 | Ht 67.0 in | Wt 213.8 lb

## 2017-04-07 DIAGNOSIS — I10 Essential (primary) hypertension: Secondary | ICD-10-CM

## 2017-04-07 DIAGNOSIS — Z79899 Other long term (current) drug therapy: Secondary | ICD-10-CM | POA: Diagnosis not present

## 2017-04-07 DIAGNOSIS — M069 Rheumatoid arthritis, unspecified: Secondary | ICD-10-CM | POA: Insufficient documentation

## 2017-04-07 DIAGNOSIS — Z95811 Presence of heart assist device: Secondary | ICD-10-CM | POA: Diagnosis not present

## 2017-04-07 DIAGNOSIS — I50810 Right heart failure, unspecified: Secondary | ICD-10-CM | POA: Insufficient documentation

## 2017-04-07 DIAGNOSIS — E0590 Thyrotoxicosis, unspecified without thyrotoxic crisis or storm: Secondary | ICD-10-CM | POA: Insufficient documentation

## 2017-04-07 DIAGNOSIS — N183 Chronic kidney disease, stage 3 unspecified: Secondary | ICD-10-CM

## 2017-04-07 DIAGNOSIS — Z95810 Presence of automatic (implantable) cardiac defibrillator: Secondary | ICD-10-CM | POA: Insufficient documentation

## 2017-04-07 DIAGNOSIS — I428 Other cardiomyopathies: Secondary | ICD-10-CM | POA: Insufficient documentation

## 2017-04-07 DIAGNOSIS — M109 Gout, unspecified: Secondary | ICD-10-CM | POA: Insufficient documentation

## 2017-04-07 DIAGNOSIS — N186 End stage renal disease: Secondary | ICD-10-CM | POA: Insufficient documentation

## 2017-04-07 DIAGNOSIS — I482 Chronic atrial fibrillation: Secondary | ICD-10-CM | POA: Diagnosis not present

## 2017-04-07 DIAGNOSIS — D649 Anemia, unspecified: Secondary | ICD-10-CM | POA: Diagnosis not present

## 2017-04-07 DIAGNOSIS — I5022 Chronic systolic (congestive) heart failure: Secondary | ICD-10-CM

## 2017-04-07 DIAGNOSIS — I132 Hypertensive heart and chronic kidney disease with heart failure and with stage 5 chronic kidney disease, or end stage renal disease: Secondary | ICD-10-CM | POA: Diagnosis not present

## 2017-04-07 DIAGNOSIS — Z7901 Long term (current) use of anticoagulants: Secondary | ICD-10-CM | POA: Insufficient documentation

## 2017-04-07 LAB — BASIC METABOLIC PANEL
Anion gap: 4 — ABNORMAL LOW (ref 5–15)
BUN: 23 mg/dL — AB (ref 6–20)
CALCIUM: 8.9 mg/dL (ref 8.9–10.3)
CHLORIDE: 108 mmol/L (ref 101–111)
CO2: 18 mmol/L — ABNORMAL LOW (ref 22–32)
CREATININE: 1.58 mg/dL — AB (ref 0.61–1.24)
GFR, EST AFRICAN AMERICAN: 58 mL/min — AB (ref >60)
GFR, EST NON AFRICAN AMERICAN: 50 mL/min — AB (ref >60)
Glucose, Bld: 85 mg/dL (ref 65–99)
Potassium: 5.1 mmol/L (ref 3.5–5.1)
SODIUM: 130 mmol/L — AB (ref 135–145)

## 2017-04-07 LAB — CBC
HCT: 39.7 % (ref 39.0–52.0)
HEMOGLOBIN: 12.5 g/dL — AB (ref 13.0–17.0)
MCH: 23.4 pg — AB (ref 26.0–34.0)
MCHC: 31.5 g/dL (ref 30.0–36.0)
MCV: 74.3 fL — ABNORMAL LOW (ref 78.0–100.0)
PLATELETS: 96 10*3/uL — AB (ref 150–400)
RBC: 5.34 MIL/uL (ref 4.22–5.81)
RDW: 17.2 % — ABNORMAL HIGH (ref 11.5–15.5)
WBC: 5.6 10*3/uL (ref 4.0–10.5)

## 2017-04-07 LAB — PROTIME-INR
INR: 2.55
PROTHROMBIN TIME: 27.2 s — AB (ref 11.4–15.2)

## 2017-04-07 LAB — LACTATE DEHYDROGENASE: LDH: 260 U/L — AB (ref 98–192)

## 2017-04-07 MED ORDER — DOXAZOSIN MESYLATE 2 MG PO TABS: 2.0000 mg | ORAL_TABLET | Freq: Every day | ORAL | 6 refills | Status: DC

## 2017-04-07 NOTE — Progress Notes (Signed)
Patient ID: [name], male   DOB: 1971/01/12, 46 y.o.   MRN: [id number]    LVAD CLINIC NOTE  CHF: [name]   HPI: [name] is a 46 y/o male with systolic HF due to NICM (EF 20-25%) s/p [employer] ICD, chronic AF and gout who underwent HM II VAD implantation on 12/20/14. 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   Admitted from [facility] in [place] in 5/17 with LVAD driveline abscess. Hospitalization was complicated by septic shock, MSSA bacteremia, CDiff, and AKI.He underwent surgical I&D of wound with exposed LVAD hardware. TEE did not show any vegetation on valves or ICD wires. Over the course of his hospitalization he required multiple blood products due to symptomatic anemia and blood loss from abdominal wound. Treated with IV abx for multiple weeks. Developed AKI/ESRD and required HD for several months before recovering.   Returns for LVAD f/u. Continues to do well. Recently upgraded to Status 3 for transplant at [facility]. Weight up 11 pounds. Denies SOB. Says he has been eating a lot. Minimal edema. Takes lasix only PRN and has not take recently. Denies orthopnea or PND. No fevers, chills or problems with driveline. No bleeding, melena or neuro symptoms. No VAD alarms. Taking all meds as prescribed.    Echo 1/18. EF 20%. 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 Severe TR. Modrerate MR. Trivial AI   VAD Indication: Bridge to Transplant- listed as status 3 at [facility] (30-day started 04/05/17). Status 7 at [facility].    VAD interrogation & Equipment Management: Speed:9200 Flow: 4.3 Power:5.0 w PI:5.0  Alarms: no clinical alarms Events: none  Fixed speed 9200 Low speed limit: 8600  Primary Controller: Replace back up battery in 35months. Back up controller: Replace back up battery in 106months.  [place] 11/2015 RA = 6 RV = 23/8 PA = 25/4 (19) PCW = 6 Fick cardiac output/index = 4.8/2.6 PVR = 2.4 WU Ao sat = 98% PA sat = 49%,  51% 1. Well compensated hemodynamics with VAD support. 2. PA sat low in the setting of marked anemia (Hgb 7.7 g/dl)     Past Medical History:  Diagnosis Date  . AICD (automatic cardioverter/defibrillator) present   . AKI (acute kidney injury) ([place])   . Atrial fibrillation -parosysmal     Rx w amiodarone  . Automatic implantable cardiac defibrillator -BSX    single chamber  . CHF (congestive heart failure) ([place])   . Chronic systolic heart failure (HCC)    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%)  . Elevated LFTs   . GI bleed -massive    11 Units 2012  . Gout   . H/O hyperthyroidism   . Hypertension   . Noncompliance    H/O  MEDICAL NONCOMPLIANCE  . 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 Polymorphic ventricular tachycardia (HCC)    RECURRENT WITH APPROPRIATE SHOCK THERAPY IN THE PAST  . RA (rheumatoid arthritis) ([place])   . Severe mitral regurgitation   . Tricuspid valve regurgitation    SEVERE  . Ventricular fibrillation ([place])    WITH APPROPRIATE SHOCK THERAPY IN THE PAST    Current Outpatient Medications  Medication Sig Dispense Refill  . allopurinol (ZYLOPRIM) 100 MG tablet Take 1 tablet (100 mg total) by mouth daily. 30 tablet 11  . amoxicillin (AMOXIL) 500 MG capsule Take 4 capsules (2,000 mg total) by mouth as directed. 1 hour prior to dental appointment 4 capsule 1  . citalopram (  CELEXA) 20 MG tablet Take 1 tablet (20 mg total) by mouth daily. 30 tablet 11  . ferrous sulfate 324 (65 Fe) MG TBEC Take 1 tablet (325 mg total) by mouth 2 (two) times daily. 60 tablet 5  . furosemide (LASIX) 40 MG tablet Take 1 tablet (40 mg total) by mouth daily as needed (weight gain or swelling). 30 tablet 6  . pregabalin (LYRICA) 75 MG capsule Take 75 mg by mouth daily.    . sacubitril-valsartan (ENTRESTO) 97-103 MG Take 1 tablet by mouth 2 (two) times daily. 60 tablet 11  . sildenafil (REVATIO) 20 MG tablet take 1 tablet by mouth  three times a day 90 tablet 11  . spironolactone (ALDACTONE) 25 MG tablet Take 0.5 tablets (12.5 mg total) by mouth daily. 15 tablet 5  . tamsulosin (FLOMAX) 0.4 MG CAPS capsule Take 1 capsule (0.4 mg total) by mouth daily. 30 capsule 11  . warfarin (COUMADIN) 4 MG tablet Take 6 mg by mouth daily.     Marland Kitchen zolpidem (AMBIEN) 10 MG tablet Take 10 mg by mouth at bedtime as needed for sleep.    Marland Kitchen doxazosin (CARDURA) 2 MG tablet Take 1 tablet (2 mg total) by mouth daily. 90 tablet 6   No current facility-administered medications for this encounter.     Phytonadione  REVIEW OF SYSTEMS: All systems negative except as listed in HPI, PMH and Problem list.  Vital Signs:  BP 120/81   Pulse 78   Resp 16   Ht 5\' 7"  (1.702 m)   Wt 213 lb 12.8 oz (97 kg)   SpO2 98%   BMI 33.49 kg/m    Vital Signs:  Doppler Pressure 130              Automatc BP: 120/81 (100) HR:78  SPO2:96  %  Weight: 213.8 lb w/o eqt Last weight: 202.4 lb  Physical exam: General:  NAD.  HEENT: normal  Neck: supple. JVP 9-10  Carotids 2+ bilat; no bruits. No lymphadenopathy or thryomegaly appreciated. Cor: LVAD hum.  Lungs: Clear. Abdomen: obese soft, nontender, non-distended. No hepatosplenomegaly. No bruits or masses. Good bowel sounds. Driveline site clean. Anchor in place.  Extremities: no cyanosis, clubbing, rash. Warm. Trace edema  Neuro: alert & oriented x 3. No focal deficits. Moves all 4 without problem     ASSESSMENT AND PLAN:   1. Chronic systolic HF due to NICM:  - Status post HMII LVAD implantation 12/20/14.Echo 1/18. Still with significant RV dysfunction and severe TR   - Doing well NYHA I. - Weight up about 11 pounds and some of this seems to be fluid. Will have him take lasix for two days and then resume prn lasix dosing. Watch renal function closely. Reinforced need for daily weights and reviewed use of sliding scale diuretics. - Now listed Status 3 for transplant at [facility] ([place] 7 at [facility]) - VAD  interrogated personally. Parameters stable. 2. RV failure:  Now off milrinone.   Off digoxin with renal failure.  - Stable. Continue sildenafil  3. HTN:  - BP elevated in setting of weight gain and fluid overload. Will give lasix - Continue Entresto 97/103 - Unable to increase spiro to 25mg  due to elevated K - intolerant amlodipine due to leg edema. Avoiding b-blocker with RV failure - will add doxazosin 2mg  daily  4. Atrial fibrillation: .  --Rate controlled.  On coumadin for VAD. He is no longer on amiodarone.  5. Anticoagulation management: INR goal 2.0-2.5, no aspirin. INR  2.55. Discussed with PharmD personally today in [place] 6.  CKD- Stage III - stable at 1.58 today Baseline rang 1.5-1.8 7. Driveline infection: wound completely healed. Has seen ID. Now off abx. Resolved.  8. Anemia -Hgb stable. MCV remains low. Has received Feraheme in past.  9. VAD - VAD interrogated personally. Parameters stable. - LDH stable 260 - Listed Status 3 for transplant - Driveline looks good.   Total time spent 35 minutes. Over half that time spent discussing above.    Glori Bickers, MD  7:50 PM

## 2017-04-07 NOTE — Progress Notes (Signed)
Patient presents for 2 month  follow up in [facility] today. Reports no problems with VAD equipment or concerns with drive line.  Vital Signs:  Doppler Pressure 130   Automatc BP: 120/81 (100) HR:78   SPO2:96  %  Weight: 213.8 lb w/o eqt Last weight: 202.4 lb  VAD Indication: Bridge to Transplant- listed as status 3 at [facility] (30-day started 04/05/17). Status 7 at [facility].    VAD interrogation & Equipment Management: Speed:9200 Flow: 4.3 Power:5.0 w    PI:5.0  Alarms: no clinical alarms Events: none  Fixed speed 9200 Low speed limit: 8600  Primary Controller:  Replace back up battery in 63months. Back up controller:   Replace back up battery in 5 months.  Annual Equipment Maintenance on UBC/PM was performed on 04/07/2017.   I reviewed the LVAD parameters from today and compared the results to the patient's prior recorded data. LVAD interrogation was NEGATIVE for significant power changes, NEGATIVE for clinical alarms and STABLE for PI events/speed drops. No programming changes were made and pump is functioning within specified parameters. Pt is performing daily controller and system monitor self tests along with completing weekly and monthly maintenance for LVAD equipment.  LVAD equipment check completed and is in good working order. Back-up equipment present. Charged back up battery and performed self-test on equipment.   Exit Site Care: Drive line is being maintained weekly  by VAD Coordinators. Drive line exit site well healed and incorporated. The velour is fully implanted at exit site. Dressing dry and intact. No erythema or drainage. Stabilization device present and accurately applied. Pt denies fever or chills. Pt states they have adequate dressing supplies at home.   Significant Events on VAD Support:  08/2015 >> AKI requiring dialysis, Bactremia / VAD Pocket infection requiring debriedment. GIB  Device:BS Single Lead Therapies: on VF 220  BP & Labs:  MAP  130 - Doppler is reflecting modified systolic  Hgb 45.4 - No S/S of bleeding. Specifically denies melena/BRBPR or nosebleeds.  LDH stable at 260 with established baseline of 230- 330. Denies tea-colored urine. No power elevations noted on interrogation.   Plan: 1. RTC in 2 months 2. Take a LASIX today and tomorrow 3. Start doxazosin 2mg  daily   Balinda Quails RN [place] Coordinator   Office: [phone] 24/7 Emergency VAD Pager: [phone]

## 2017-04-08 ENCOUNTER — Encounter (HOSPITAL_COMMUNITY): Payer: Self-pay | Admitting: *Deleted

## 2017-04-15 ENCOUNTER — Ambulatory Visit (HOSPITAL_COMMUNITY)
Admission: RE | Admit: 2017-04-15 | Discharge: 2017-04-15 | Disposition: A | Discharge status: Home / Self Care | Payer: 59 | Source: Ambulatory Visit | Attending: Internal Medicine | Admitting: Internal Medicine

## 2017-04-15 ENCOUNTER — Ambulatory Visit (HOSPITAL_COMMUNITY): Payer: Self-pay | Admitting: Pharmacist

## 2017-04-15 DIAGNOSIS — Z95811 Presence of heart assist device: Secondary | ICD-10-CM | POA: Insufficient documentation

## 2017-04-15 DIAGNOSIS — Z7901 Long term (current) use of anticoagulants: Secondary | ICD-10-CM | POA: Diagnosis present

## 2017-04-15 LAB — PROTIME-INR
INR: 2.1
PROTHROMBIN TIME: 23.4 s — AB (ref 11.4–15.2)

## 2017-04-16 ENCOUNTER — Other Ambulatory Visit (HOSPITAL_COMMUNITY): Payer: Self-pay | Admitting: Unknown Physician Specialty

## 2017-04-16 DIAGNOSIS — Z95811 Presence of heart assist device: Secondary | ICD-10-CM

## 2017-04-16 DIAGNOSIS — Z7901 Long term (current) use of anticoagulants: Secondary | ICD-10-CM

## 2017-04-19 IMAGING — CR DG CHEST 1V PORT
1 series · 1 of 1 positions shown · non-contrast
Comparison: Portable chest x-ray of 09/02/2015 and 08/30/2015

CLINICAL DATA: Left ventricular assist device, followup

EXAM:
PORTABLE CHEST 1 VIEW

[AP]
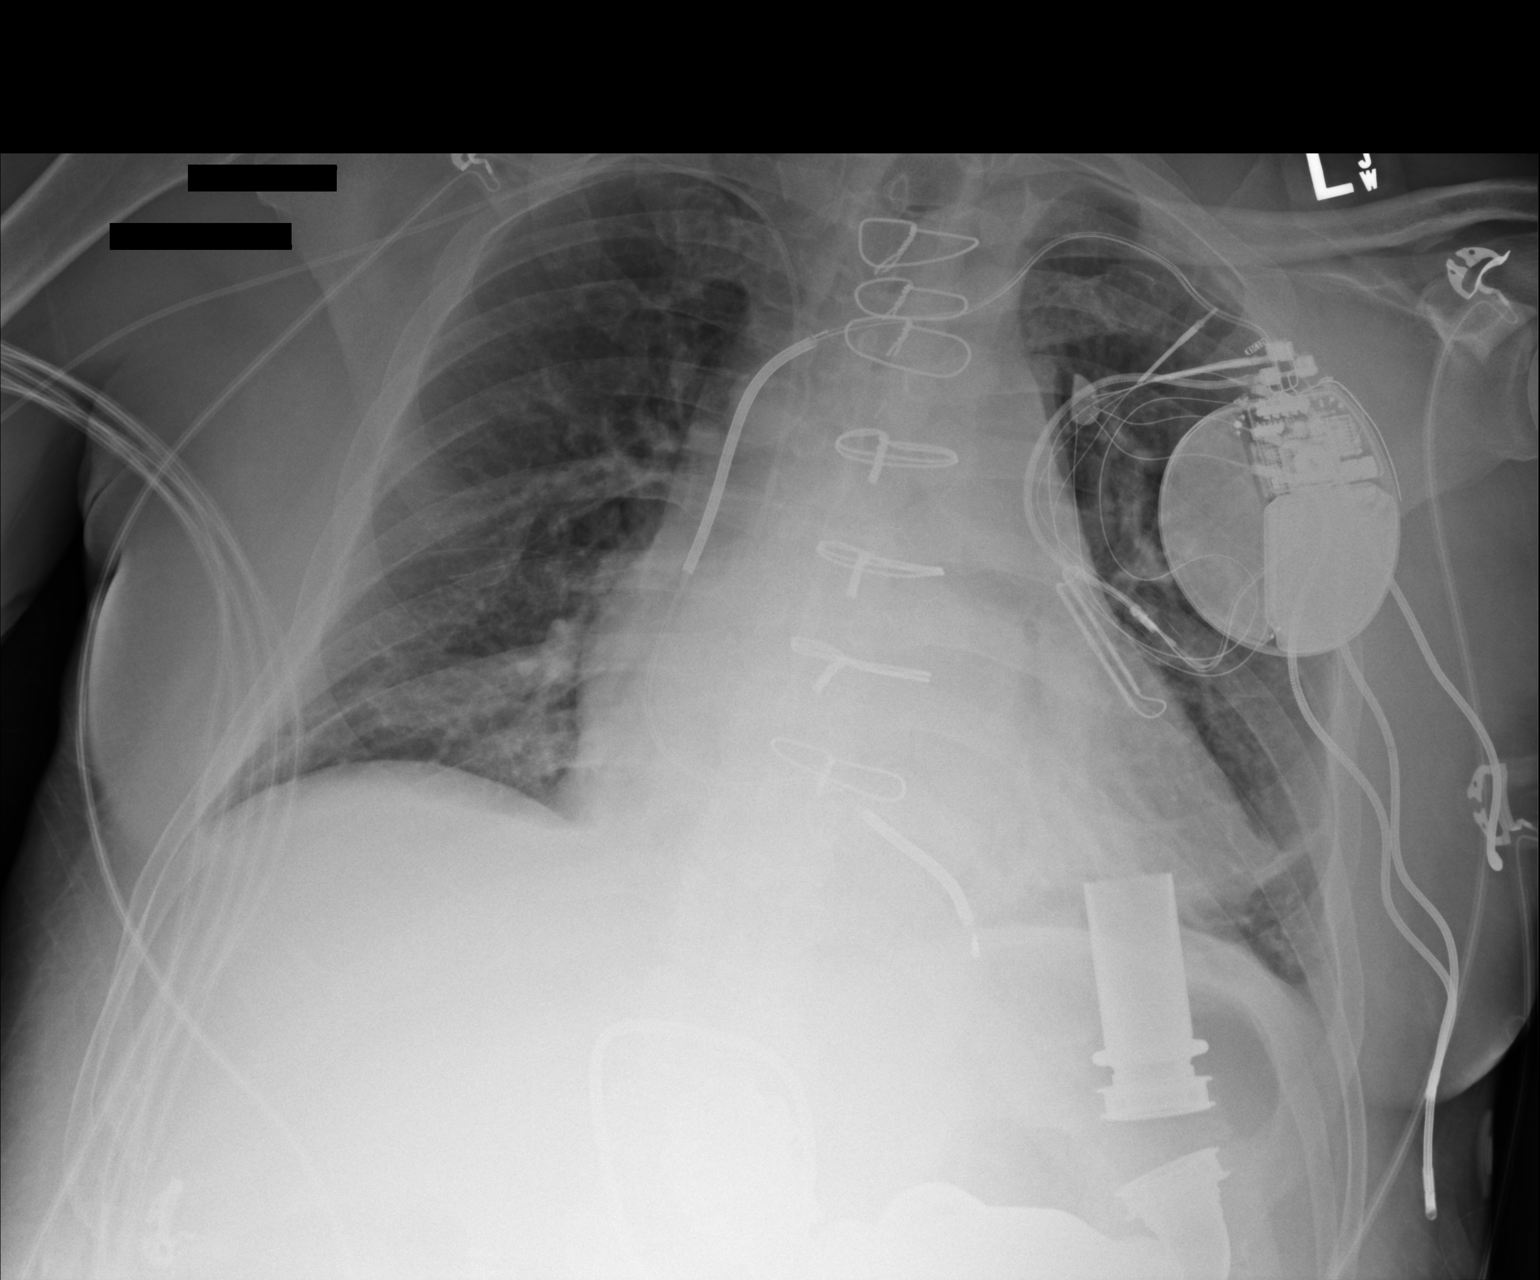

[1 of 1 positions shown; findings below may reference images not displayed]

FINDINGS: There is little change to perhaps minimal increase in linear
opacities at the lung bases most consistent with bibasilar linear
atelectasis. Cardiomegaly is stable. AICD leads and left ventricular
assist device remain. Median sternotomy sutures are noted.
IMPRESSION: Minimal increase in bibasilar linear atelectasis.

## 2017-04-20 IMAGING — CR DG CHEST 1V PORT
1 series · 1 of 1 positions shown · non-contrast
Comparison: 09/03/2015.

CLINICAL DATA: Left ventricular assist device.

EXAM:
PORTABLE CHEST 1 VIEW

[AP]
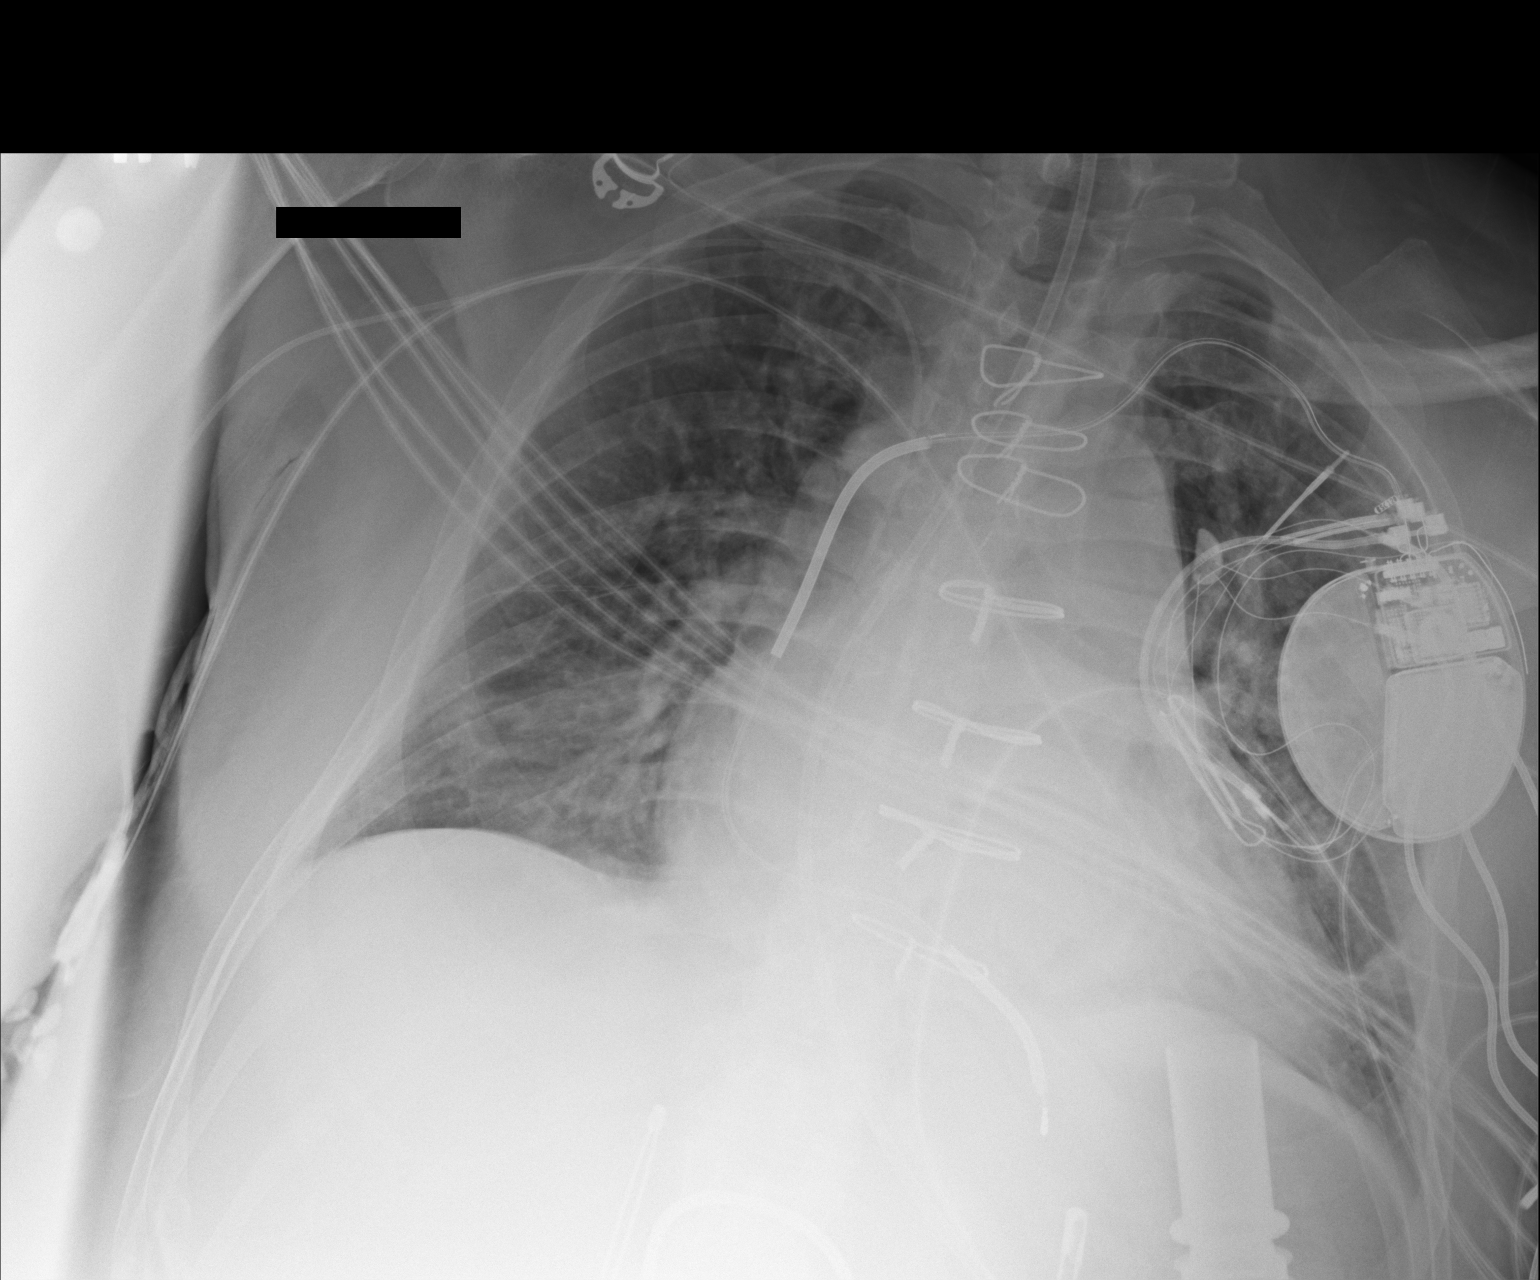

[1 of 1 positions shown; findings below may reference images not displayed]

FINDINGS: Interim placement of feeding tube, its tip is below left
hemidiaphragm. Right PICC line in stable position. Left ventricular
assist device noted. Cardiac pacer with lead tip projected over
right ventricle. Prior CABG. Stable cardiomegaly. Lung volumes with
mild basilar atelectasis and/or infiltrate. No pleural effusion or
pneumothorax.
IMPRESSION: 1. Interim placement of feeding tube, its tip is below left
hemidiaphragm. Right PICC line stable position.
2. Left ventricular assist device again noted. Cardiac pacer stable
position. Prior CABG. Stable cardiomegaly.
3. Low lung volumes with mild bibasilar atelectasis and/or
infiltrate.

## 2017-04-22 ENCOUNTER — Other Ambulatory Visit (HOSPITAL_COMMUNITY): Payer: 59

## 2017-04-25 IMAGING — CR DG CHEST 1V PORT
1 series · 1 of 1 positions shown · non-contrast
Comparison: 09/07/2015

CLINICAL DATA: Left ventricular assist device

EXAM:
PORTABLE CHEST 1 VIEW

[AP]
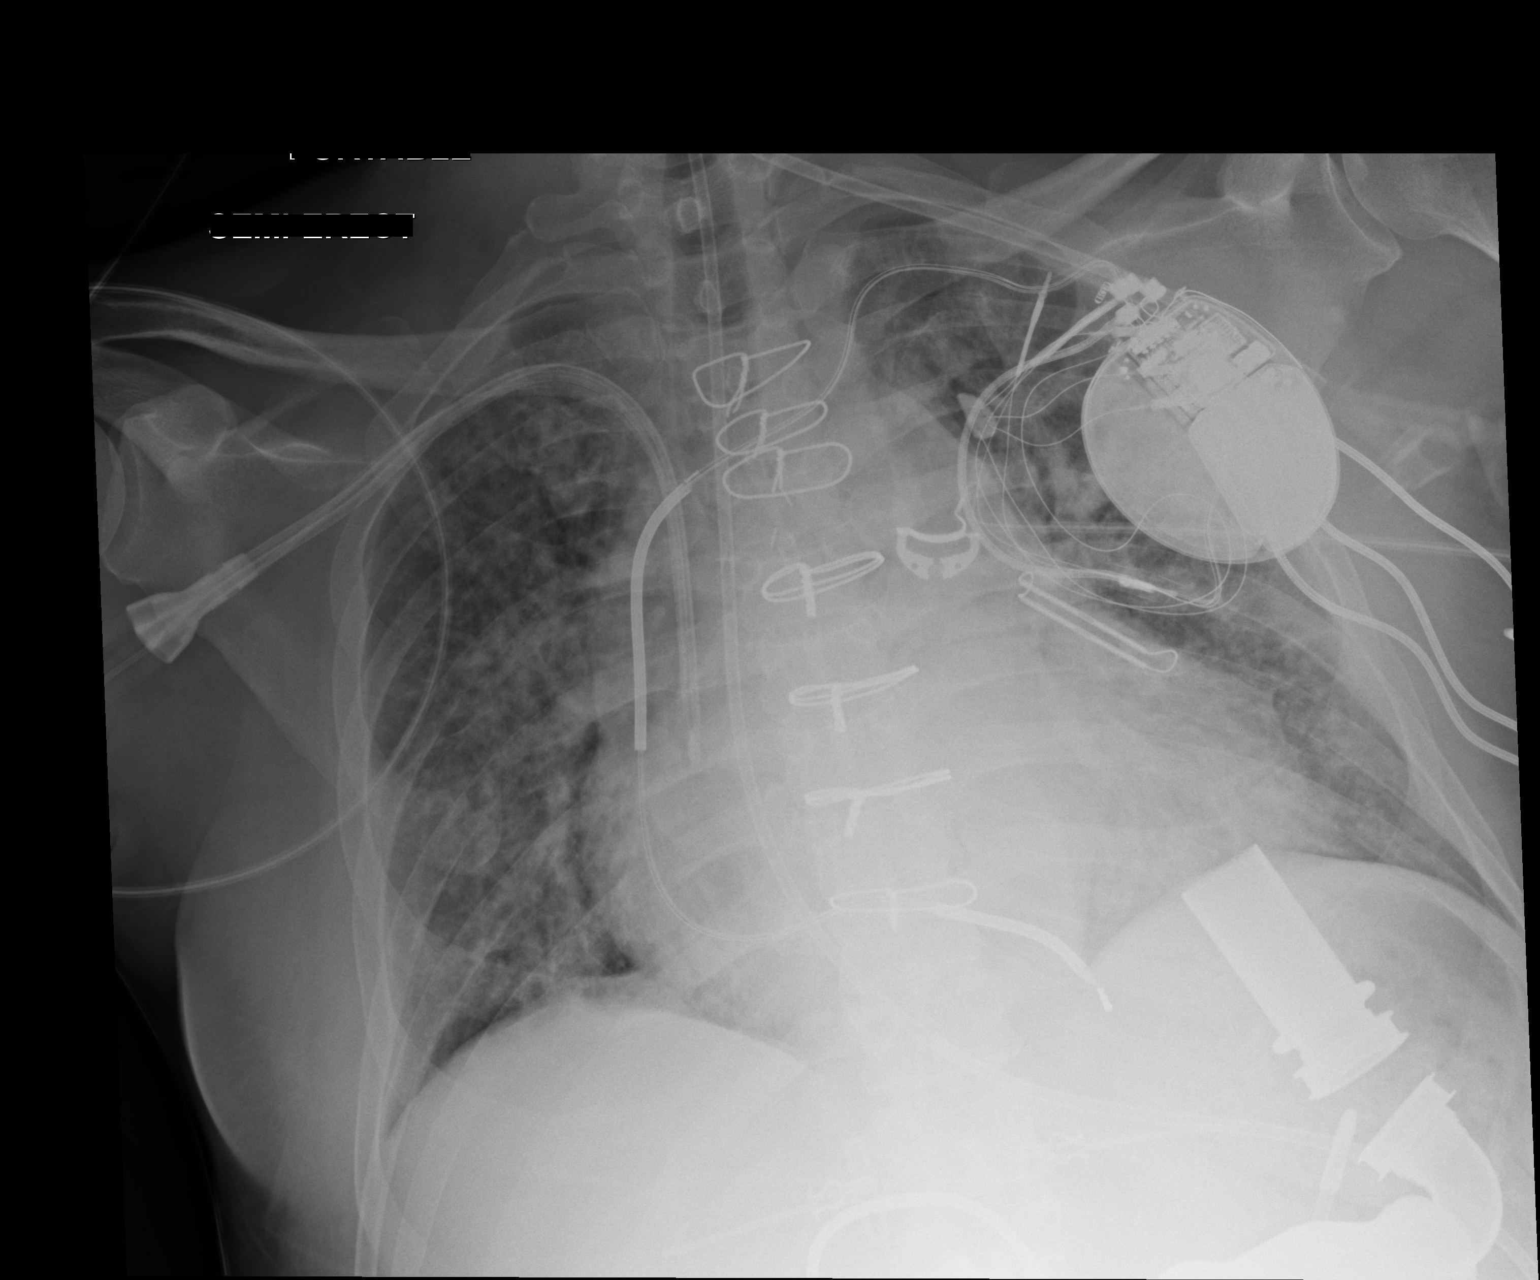

[1 of 1 positions shown; findings below may reference images not displayed]

FINDINGS: Left ventricular assist device unchanged in position. AICD unchanged
in position. Left atrial appendage clip unchanged.

Right subclavian central venous catheter tip at the cavoatrial
junction. Feeding tube in place with the tip not visualized.

Mild improvement in diffuse bilateral airspace disease most
compatible with resolving edema. Improvement in bibasilar
atelectasis. No effusion.
IMPRESSION: Left ventricular assist device unchanged.

Improvement in pulmonary edema.

## 2017-05-12 MED ORDER — LIDOCAINE HCL (PF) 1 % IJ SOLN
0.50 | INTRAMUSCULAR | Status: DC
Start: ? — End: 2017-05-12

## 2017-05-12 MED ORDER — HEPARIN SODIUM (PORCINE) 5000 UNIT/ML IJ SOLN
5000.00 | INTRAMUSCULAR | Status: DC
Start: 2017-05-11 — End: 2017-05-12

## 2017-05-12 MED ORDER — AMPHOTERICIN B LIPID 5 MG/ML IV SUSP
50.00 | INTRAVENOUS | Status: DC
Start: 2017-05-16 — End: 2017-05-12

## 2017-05-12 MED ORDER — GLUCAGON HCL RDNA (DIAGNOSTIC) 1 MG IJ SOLR
1.00 | INTRAMUSCULAR | Status: DC
Start: ? — End: 2017-05-12

## 2017-05-12 MED ORDER — HEPARIN SODIUM (PORCINE) 1000 UNIT/ML IJ SOLN
INTRAMUSCULAR | Status: DC
Start: ? — End: 2017-05-12

## 2017-05-12 MED ORDER — GENERIC EXTERNAL MEDICATION
15.00 | Status: DC
Start: ? — End: 2017-05-12

## 2017-05-12 MED ORDER — SENNOSIDES-DOCUSATE SODIUM 8.6-50 MG PO TABS
1.00 | ORAL_TABLET | ORAL | Status: DC
Start: 2017-05-12 — End: 2017-05-12

## 2017-05-12 MED ORDER — ONDANSETRON HCL 4 MG/2ML IJ SOLN
4.00 | INTRAMUSCULAR | Status: DC
Start: ? — End: 2017-05-12

## 2017-05-12 MED ORDER — CLOTRIMAZOLE 10 MG MT TROC
10.00 | OROMUCOSAL | Status: DC
Start: 2017-05-12 — End: 2017-05-12

## 2017-05-12 MED ORDER — SIMETHICONE 80 MG PO CHEW
80.00 | CHEWABLE_TABLET | ORAL | Status: DC
Start: ? — End: 2017-05-12

## 2017-05-12 MED ORDER — VALACYCLOVIR HCL 1 G PO TABS
500.00 | ORAL_TABLET | ORAL | Status: DC
Start: 2017-05-12 — End: 2017-05-12

## 2017-05-12 MED ORDER — ASPIRIN EC 81 MG PO TBEC
81.00 | DELAYED_RELEASE_TABLET | ORAL | Status: DC
Start: 2017-05-12 — End: 2017-05-12

## 2017-05-12 MED ORDER — DEXTROSE 50 % IV SOLN
12.50 | INTRAVENOUS | Status: DC
Start: ? — End: 2017-05-12

## 2017-05-12 MED ORDER — CITALOPRAM HYDROBROMIDE 20 MG PO TABS
20.00 | ORAL_TABLET | ORAL | Status: DC
Start: 2017-05-12 — End: 2017-05-12

## 2017-05-12 MED ORDER — OXYCODONE HCL 5 MG PO TABS
5.00 | ORAL_TABLET | ORAL | Status: DC
Start: ? — End: 2017-05-12

## 2017-05-12 MED ORDER — MYCOPHENOLATE MOFETIL 500 MG PO TABS
1500.00 | ORAL_TABLET | ORAL | Status: DC
Start: 2017-05-11 — End: 2017-05-12

## 2017-05-12 MED ORDER — POLYETHYLENE GLYCOL 3350 17 G PO PACK
17.00 | PACK | ORAL | Status: DC
Start: ? — End: 2017-05-12

## 2017-05-12 MED ORDER — ATOVAQUONE 750 MG/5ML PO SUSP
1500.00 | ORAL | Status: DC
Start: 2017-05-12 — End: 2017-05-12

## 2017-05-12 MED ORDER — PANTOPRAZOLE SODIUM 40 MG PO TBEC
40.00 | DELAYED_RELEASE_TABLET | ORAL | Status: DC
Start: 2017-05-12 — End: 2017-05-12

## 2017-05-12 MED ORDER — TACROLIMUS 1 MG PO CAPS
3.00 | ORAL_CAPSULE | ORAL | Status: DC
Start: 2017-05-12 — End: 2017-05-12

## 2017-05-12 MED ORDER — TACROLIMUS 1 MG PO CAPS
3.00 | ORAL_CAPSULE | ORAL | Status: DC
Start: 2017-05-11 — End: 2017-05-12

## 2017-05-12 MED ORDER — SODIUM BICARBONATE 650 MG PO TABS
1300.00 | ORAL_TABLET | ORAL | Status: DC
Start: 2017-05-12 — End: 2017-05-12

## 2017-05-12 MED ORDER — PREDNISONE 20 MG PO TABS
20.00 | ORAL_TABLET | ORAL | Status: DC
Start: 2017-05-12 — End: 2017-05-12

## 2017-05-12 MED ORDER — ACETAMINOPHEN 325 MG PO TABS
650.00 | ORAL_TABLET | ORAL | Status: DC
Start: ? — End: 2017-05-12

## 2017-05-12 MED ORDER — PRAVASTATIN SODIUM 20 MG PO TABS
40.00 | ORAL_TABLET | ORAL | Status: DC
Start: 2017-05-11 — End: 2017-05-12

## 2017-05-12 MED ORDER — PREDNISONE 10 MG PO TABS
10.00 | ORAL_TABLET | ORAL | Status: DC
Start: ? — End: 2017-05-12

## 2017-05-12 MED ORDER — GENERIC EXTERNAL MEDICATION
1.00 | Status: DC
Start: ? — End: 2017-05-12

## 2017-05-13 ENCOUNTER — Telehealth (HOSPITAL_COMMUNITY): Payer: Self-pay

## 2017-05-13 NOTE — Telephone Encounter (Signed)
Patients insurance is active and benefits verified through [facility] - $10.00 co-pay, no deductible, out of pocket amount of $2,500/$2,500 has been met, no co-insurance, and no pre-authorization is required. Passport/reference [phone]  Patient will be contacted and scheduled after review by the RN Navigator.

## 2017-05-21 ENCOUNTER — Other Ambulatory Visit (HOSPITAL_COMMUNITY): Payer: Self-pay | Admitting: Unknown Physician Specialty

## 2017-05-21 ENCOUNTER — Other Ambulatory Visit (HOSPITAL_COMMUNITY): Payer: Self-pay | Admitting: *Deleted

## 2017-05-21 ENCOUNTER — Other Ambulatory Visit (HOSPITAL_COMMUNITY): Payer: 59

## 2017-05-21 ENCOUNTER — Ambulatory Visit (HOSPITAL_COMMUNITY)
Admission: RE | Admit: 2017-05-21 | Discharge: 2017-05-21 | Disposition: A | Discharge status: Home / Self Care | Payer: 59 | Source: Ambulatory Visit | Attending: Cardiology | Admitting: Cardiology

## 2017-05-21 DIAGNOSIS — E875 Hyperkalemia: Secondary | ICD-10-CM | POA: Insufficient documentation

## 2017-05-21 LAB — BASIC METABOLIC PANEL
Anion gap: 10 (ref 5–15)
BUN: 50 mg/dL — ABNORMAL HIGH (ref 6–20)
CHLORIDE: 104 mmol/L (ref 101–111)
CO2: 24 mmol/L (ref 22–32)
CREATININE: 2.31 mg/dL — AB (ref 0.61–1.24)
Calcium: 8.9 mg/dL (ref 8.9–10.3)
GFR calc Af Amer: 37 mL/min — ABNORMAL LOW (ref >60)
GFR calc non Af Amer: 32 mL/min — ABNORMAL LOW (ref >60)
Glucose, Bld: 108 mg/dL — ABNORMAL HIGH (ref 65–99)
Potassium: 5.8 mmol/L — ABNORMAL HIGH (ref 3.5–5.1)
SODIUM: 138 mmol/L (ref 135–145)

## 2017-06-08 ENCOUNTER — Encounter (HOSPITAL_COMMUNITY): Payer: 59

## 2017-06-11 ENCOUNTER — Other Ambulatory Visit (HOSPITAL_COMMUNITY): Payer: Self-pay | Admitting: Unknown Physician Specialty

## 2017-06-11 ENCOUNTER — Ambulatory Visit (HOSPITAL_COMMUNITY)
Admission: RE | Admit: 2017-06-11 | Discharge: 2017-06-11 | Disposition: A | Discharge status: Home / Self Care | Payer: 59 | Source: Ambulatory Visit | Attending: Cardiology | Admitting: Cardiology

## 2017-06-11 DIAGNOSIS — E875 Hyperkalemia: Secondary | ICD-10-CM | POA: Insufficient documentation

## 2017-06-11 DIAGNOSIS — Z941 Heart transplant status: Secondary | ICD-10-CM | POA: Diagnosis not present

## 2017-06-11 LAB — BASIC METABOLIC PANEL
ANION GAP: 11 (ref 5–15)
BUN: 56 mg/dL — ABNORMAL HIGH (ref 6–20)
CO2: 20 mmol/L — AB (ref 22–32)
CREATININE: 1.99 mg/dL — AB (ref 0.61–1.24)
Calcium: 9 mg/dL (ref 8.9–10.3)
Chloride: 107 mmol/L (ref 101–111)
GFR calc non Af Amer: 38 mL/min — ABNORMAL LOW (ref >60)
GFR, EST AFRICAN AMERICAN: 44 mL/min — AB (ref >60)
Glucose, Bld: 102 mg/dL — ABNORMAL HIGH (ref 65–99)
Potassium: 4.9 mmol/L (ref 3.5–5.1)
SODIUM: 138 mmol/L (ref 135–145)

## 2017-06-13 LAB — TACROLIMUS LEVEL: TACROLIMUS (FK506) - LABCORP: 9.6 ng/mL (ref 2.0–20.0)

## 2017-06-21 ENCOUNTER — Other Ambulatory Visit (HOSPITAL_COMMUNITY): Payer: Self-pay | Admitting: Unknown Physician Specialty

## 2017-06-21 NOTE — Progress Notes (Signed)
Opened in error

## 2017-08-04 ENCOUNTER — Telehealth (HOSPITAL_COMMUNITY): Payer: Self-pay

## 2017-08-04 NOTE — Telephone Encounter (Signed)
Called Heart Transplant Coordinator Camile Barfield - lm on vm

## 2017-08-05 ENCOUNTER — Other Ambulatory Visit (HOSPITAL_COMMUNITY): Payer: Self-pay | Admitting: Unknown Physician Specialty

## 2017-08-05 DIAGNOSIS — Z941 Heart transplant status: Secondary | ICD-10-CM

## 2017-08-05 DIAGNOSIS — T829XXA Unspecified complication of cardiac and vascular prosthetic device, implant and graft, initial encounter: Secondary | ICD-10-CM

## 2017-08-06 ENCOUNTER — Ambulatory Visit (HOSPITAL_COMMUNITY)
Admission: RE | Admit: 2017-08-06 | Discharge: 2017-08-06 | Disposition: A | Discharge status: Home / Self Care | Payer: 59 | Source: Ambulatory Visit | Attending: Cardiology | Admitting: Cardiology

## 2017-08-06 DIAGNOSIS — Z7901 Long term (current) use of anticoagulants: Secondary | ICD-10-CM | POA: Diagnosis not present

## 2017-08-06 DIAGNOSIS — Z95811 Presence of heart assist device: Secondary | ICD-10-CM | POA: Insufficient documentation

## 2017-08-06 DIAGNOSIS — Z941 Heart transplant status: Secondary | ICD-10-CM

## 2017-08-06 LAB — BASIC METABOLIC PANEL
Anion gap: 13 (ref 5–15)
BUN: 57 mg/dL — AB (ref 6–20)
CALCIUM: 9.2 mg/dL (ref 8.9–10.3)
CHLORIDE: 108 mmol/L (ref 101–111)
CO2: 21 mmol/L — ABNORMAL LOW (ref 22–32)
CREATININE: 2.21 mg/dL — AB (ref 0.61–1.24)
GFR, EST AFRICAN AMERICAN: 38 mL/min — AB (ref >60)
GFR, EST NON AFRICAN AMERICAN: 33 mL/min — AB (ref >60)
Glucose, Bld: 105 mg/dL — ABNORMAL HIGH (ref 65–99)
Potassium: 3.4 mmol/L — ABNORMAL LOW (ref 3.5–5.1)
SODIUM: 142 mmol/L (ref 135–145)

## 2017-08-06 LAB — PROTIME-INR
INR: 1.02
PROTHROMBIN TIME: 13.3 s (ref 11.4–15.2)

## 2017-08-08 LAB — TACROLIMUS LEVEL: TACROLIMUS (FK506) - LABCORP: 6.1 ng/mL (ref 2.0–20.0)

## 2017-08-30 ENCOUNTER — Inpatient Hospital Stay: Payer: 59 | Attending: Hematology | Admitting: Hematology

## 2017-08-30 ENCOUNTER — Inpatient Hospital Stay: Payer: 59

## 2017-08-31 ENCOUNTER — Encounter (HOSPITAL_COMMUNITY): Payer: Self-pay | Admitting: Emergency Medicine

## 2017-08-31 ENCOUNTER — Emergency Department (HOSPITAL_COMMUNITY)
Admission: EM | Admit: 2017-08-31 | Discharge: 2017-09-01 | Disposition: A | Discharge status: Other Acute Inpatient Hospital | Payer: 59 | Attending: Emergency Medicine | Admitting: Emergency Medicine

## 2017-08-31 DIAGNOSIS — R000 Tachycardia, unspecified: Secondary | ICD-10-CM | POA: Insufficient documentation

## 2017-08-31 DIAGNOSIS — Z941 Heart transplant status: Secondary | ICD-10-CM | POA: Insufficient documentation

## 2017-08-31 DIAGNOSIS — Y711 Therapeutic (nonsurgical) and rehabilitative cardiovascular devices associated with adverse incidents: Secondary | ICD-10-CM | POA: Insufficient documentation

## 2017-08-31 DIAGNOSIS — I5022 Chronic systolic (congestive) heart failure: Secondary | ICD-10-CM | POA: Insufficient documentation

## 2017-08-31 DIAGNOSIS — Z79899 Other long term (current) drug therapy: Secondary | ICD-10-CM | POA: Insufficient documentation

## 2017-08-31 DIAGNOSIS — Z7982 Long term (current) use of aspirin: Secondary | ICD-10-CM | POA: Diagnosis not present

## 2017-08-31 DIAGNOSIS — I110 Hypertensive heart disease with heart failure: Secondary | ICD-10-CM | POA: Diagnosis not present

## 2017-08-31 DIAGNOSIS — D649 Anemia, unspecified: Secondary | ICD-10-CM | POA: Diagnosis not present

## 2017-08-31 DIAGNOSIS — T82898A Other specified complication of vascular prosthetic devices, implants and grafts, initial encounter: Secondary | ICD-10-CM | POA: Diagnosis present

## 2017-08-31 LAB — COMPREHENSIVE METABOLIC PANEL
ALT: 16 U/L — AB (ref 17–63)
AST: 20 U/L (ref 15–41)
Albumin: 3.1 g/dL — ABNORMAL LOW (ref 3.5–5.0)
Alkaline Phosphatase: 108 U/L (ref 38–126)
Anion gap: 8 (ref 5–15)
BILIRUBIN TOTAL: 0.3 mg/dL (ref 0.3–1.2)
BUN: 24 mg/dL — AB (ref 6–20)
CHLORIDE: 111 mmol/L (ref 101–111)
CO2: 23 mmol/L (ref 22–32)
CREATININE: 1.72 mg/dL — AB (ref 0.61–1.24)
Calcium: 8.1 mg/dL — ABNORMAL LOW (ref 8.9–10.3)
GFR calc Af Amer: 52 mL/min — ABNORMAL LOW (ref >60)
GFR, EST NON AFRICAN AMERICAN: 45 mL/min — AB (ref >60)
Glucose, Bld: 116 mg/dL — ABNORMAL HIGH (ref 65–99)
POTASSIUM: 4 mmol/L (ref 3.5–5.1)
Sodium: 142 mmol/L (ref 135–145)
Total Protein: 5.3 g/dL — ABNORMAL LOW (ref 6.5–8.1)

## 2017-08-31 LAB — CBC WITH DIFFERENTIAL/PLATELET
ABS IMMATURE GRANULOCYTES: 0 10*3/uL (ref 0.0–0.1)
Basophils Absolute: 0 10*3/uL (ref 0.0–0.1)
Basophils Relative: 0 %
Eosinophils Absolute: 0 10*3/uL (ref 0.0–0.7)
Eosinophils Relative: 0 %
HEMATOCRIT: 26.9 % — AB (ref 39.0–52.0)
Hemoglobin: 8 g/dL — ABNORMAL LOW (ref 13.0–17.0)
IMMATURE GRANULOCYTES: 1 %
LYMPHS PCT: 26 %
Lymphs Abs: 0.7 10*3/uL (ref 0.7–4.0)
MCH: 25.6 pg — ABNORMAL LOW (ref 26.0–34.0)
MCHC: 29.7 g/dL — ABNORMAL LOW (ref 30.0–36.0)
MCV: 85.9 fL (ref 78.0–100.0)
MONOS PCT: 8 %
Monocytes Absolute: 0.2 10*3/uL (ref 0.1–1.0)
NEUTROS ABS: 1.6 10*3/uL — AB (ref 1.7–7.7)
NEUTROS PCT: 65 %
PLATELETS: 193 10*3/uL (ref 150–400)
RBC: 3.13 MIL/uL — AB (ref 4.22–5.81)
RDW: 17.5 % — ABNORMAL HIGH (ref 11.5–15.5)
WBC: 2.6 10*3/uL — AB (ref 4.0–10.5)

## 2017-08-31 MED ORDER — SODIUM CHLORIDE 0.9% FLUSH
10.0000 mL | INTRAVENOUS | Status: DC | PRN
  Administered 2017-08-31: 10 mL
  Filled 2017-08-31: qty 40

## 2017-08-31 MED ORDER — SODIUM CHLORIDE 0.9% FLUSH: 10.0000 mL | Freq: Two times a day (BID) | INTRAVENOUS | Status: DC

## 2017-08-31 MED ORDER — ALTEPLASE 2 MG IJ SOLR
2.0000 mg | Freq: Once | INTRAMUSCULAR | Status: AC
  Administered 2017-08-31: 2 mg
  Filled 2017-08-31: qty 2

## 2017-08-31 NOTE — ED Provider Notes (Addendum)
[facility] EMERGENCY DEPARTMENT Provider Note   CSN: [id number] Arrival date & time: 08/31/17  2018     History   Chief Complaint Chief Complaint  Patient presents with  . PICC Line Will Not Flush  . Tachycardia    HPI [name] is a 47 y.o. male.  HPI  This is a 47 year old with history of heart transplant followed at [facility], atrial fibrillation, recent history of diverticular bleed, C. difficile colitis, and CMV colitis who presents with concerns that his PICC line is not working.  Patient is currently on oral vancomycin and IV ganciclovir for his recent colitis.  He states that last night he had difficulty flushing his PICC line.  He had his home health nurse come out and she was able to flush it; however, he states overnight very little medication transfused.  He called his transplant team who recommended he be evaluated.  Regarding his recent hospitalization for colitis and GI bleed, patient reports no continued abdominal pain or bloody stools.  He denies any chest pain, shortness of breath, dyspnea on exertion.  I reviewed patient's discharge summary from [facility].  He was discharged at the end of April.  During his time at [facility] he received 8 units of blood for acute blood loss anemia.  He also was treated aggressively for CMV colitis and C. difficile colitis.  Past Medical History:  Diagnosis Date  . AICD (automatic cardioverter/defibrillator) present   . AKI (acute kidney injury) ([place])   . Atrial fibrillation -parosysmal     Rx w amiodarone  . Automatic implantable cardiac defibrillator -BSX    single chamber  . CHF (congestive heart failure) ([place])   . Chronic systolic heart failure (HCC)    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%)  . Elevated LFTs   . GI bleed -massive    11 Units 2012  . Gout   . H/O hyperthyroidism   . Hypertension   . Noncompliance    H/O  MEDICAL NONCOMPLIANCE  . 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 Polymorphic ventricular tachycardia (HCC)    RECURRENT WITH APPROPRIATE SHOCK THERAPY IN THE PAST  . RA (rheumatoid arthritis) ([place])   . Severe mitral regurgitation   . Tricuspid valve regurgitation    SEVERE  . Ventricular fibrillation ([place])    WITH APPROPRIATE SHOCK THERAPY IN THE PAST    Patient Active Problem List   Diagnosis Date Noted  . PAF (paroxysmal atrial fibrillation) ([place]) 11/18/2015  . Wound infection   . Debility 09/25/2015  . Debilitated 09/25/2015  . Chronic systolic congestive heart failure ([place])   . Colitis   . Septic shock ([place])   . Enteritis due to Clostridium difficile   . Staphylococcus aureus bacteremia   . 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[place])   . Abscess of abdominal wall   . Acute on chronic systolic (congestive) heart failure ([place])   . Left ventricular assist device (LVAD) complication 29/51/8841  . Epistaxis 05/21/2015  . Situational depression 04/13/2015  . Acute gout 01/15/2015  . Palliative care encounter 12/17/2014  . Cardiogenic shock ([place])   . Acute on chronic systolic CHF (congestive heart failure) ([place])   . Acute on chronic renal failure ([place])   . Chronic atrial fibrillation ([place])   . Low output heart failure ([place]) 12/14/2014  . Acute abdominal pain   . Near syncope   . Hypotension 11/30/2014  . AKI (acute kidney injury) (  HCC)   . Abnormal LFTs 10/26/2012  . Hyperthyroidism 05/26/2011  . 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 ICD-[employer] 07/22/2010  . Nonischemic cardiomyopathy ([place]) 07/22/2010  . Essential hypertension, benign 11/21/2008  . SYSTOLIC HEART FAILURE, CHRONIC 10/23/2008    Past Surgical History:  Procedure Laterality Date  . APPLICATION OF A-CELL OF EXTREMITY N/A 09/05/2015   Procedure: APPLICATION OF A-CELL OF sternum;  Surgeon: Loel Lofty Dillingham, DO;  Location: [place];  Service: Plastics;  Laterality: N/A;  . APPLICATION OF WOUND VAC N/A  08/27/2015   Procedure: APPLICATION OF WOUND VAC;  Surgeon: Ivin Poot, MD;  Location: [place];  Service: Thoracic;  Laterality: N/A;  . APPLICATION OF WOUND VAC N/A 08/29/2015   Procedure: WOUND VAC CHANGE;  Surgeon: Ivin Poot, MD;  Location: [place];  Service: Thoracic;  Laterality: N/A;  . APPLICATION OF WOUND VAC N/A 09/05/2015   Procedure: APPLICATION OF WOUND VAC to sternum;  Surgeon: Loel Lofty Dillingham, DO;  Location: [place];  Service: Plastics;  Laterality: N/A;  . CARDIAC CATHETERIZATION  06/2006   RIGHT HEART CATH SHOWING SEVERE BIVENTRICUALR CHF WITH MARKED FILLING AND PRESSURES  . CARDIAC CATHETERIZATION N/A 12/14/2014   Procedure: Right Heart Cath;  Surgeon: Jolaine Artist, MD;  Location: [place] CV LAB;  Service: Cardiovascular;  Laterality: N/A;  . CARDIAC CATHETERIZATION N/A 12/14/2014   Procedure: IABP Insertion;  Surgeon: Jolaine Artist, MD;  Location: [place] CV LAB;  Service: Cardiovascular;  Laterality: N/A;  . CARDIAC CATHETERIZATION N/A 12/18/2015   Procedure: Right Heart Cath;  Surgeon: Jolaine Artist, MD;  Location: [place] CV LAB;  Service: Cardiovascular;  Laterality: N/A;  . CHOLECYSTECTOMY    . ESOPHAGOGASTRODUODENOSCOPY N/A 09/17/2015   Procedure: ESOPHAGOGASTRODUODENOSCOPY (EGD);  Surgeon: Carol Ada, MD;  Location: [place];  Service: Gastroenterology;  Laterality: N/A;  . HEART TRANSPLANT    . IMPLANTABLE CARDIOVERTER DEFIBRILLATOR GENERATOR CHANGE N/A 07/01/2011   Procedure: IMPLANTABLE CARDIOVERTER DEFIBRILLATOR GENERATOR CHANGE;  Surgeon: Deboraha Sprang, MD;  Location: [facility] CATH LAB;  Service: Cardiovascular;  Laterality: N/A;  . INCISION AND DRAINAGE OF WOUND N/A 09/05/2015   Procedure: IRRIGATION AND DEBRIDEMENT sternal WOUND;  Surgeon: Loel Lofty Dillingham, DO;  Location: [place];  Service: Plastics;  Laterality: N/A;  . INSERT / REPLACE / REMOVE PACEMAKER     GUIDANT HE ICD MODEL 2180, SERIAL # [id number]  . INSERTION OF DIALYSIS CATHETER  Right 09/05/2015   Procedure: INSERTION OF DIALYSIS CATHETER;RIGHT SUBCLAVIAN;  Surgeon: Loel Lofty Dillingham, DO;  Location: [place];  Service: Plastics;  Laterality: Right;  . INSERTION OF IMPLANTABLE LEFT VENTRICULAR ASSIST DEVICE N/A 12/20/2014   Procedure: INSERTION OF IMPLANTABLE LEFT VENTRICULAR ASSIST DEVICE;  Surgeon: Ivin Poot, MD;  Location: [place];  Service: Open Heart Surgery;  Laterality: N/A;  CIRC ARREST  NITRIC OXIDE  . PECTORALIS FLAP N/A 09/09/2015   Procedure: DEBRIDEMENT AND CLOSURE WOUND WITH PLACEMENT OF ABRA CLOSURE DEVICE;  Surgeon: Loel Lofty Dillingham, DO;  Location: [place];  Service: Plastics;  Laterality: N/A;  . STERNAL CLOSURE N/A 09/17/2015   Procedure: ABDOMINAL WOUND CLOSURE WITH INCISIONAL VAC APPLICATION;  Surgeon: Ivin Poot, MD;  Location: [place];  Service: Thoracic;  Laterality: N/A;  . STERNAL WOUND DEBRIDEMENT N/A 08/27/2015   Procedure: WOUND IRRIGATION AND DEBRIDEMENT;  Surgeon: Ivin Poot, MD;  Location: [place];  Service: Thoracic;  Laterality: N/A;  . STERNAL WOUND DEBRIDEMENT N/A 08/29/2015   Procedure: DEBRIDEMENT OF chest wound;  Surgeon: Ivin Poot, MD;  Location: [place];  Service: Thoracic;  Laterality: N/A;  . TEE WITHOUT CARDIOVERSION N/A 12/12/2014   Procedure: TRANSESOPHAGEAL ECHOCARDIOGRAM (TEE);  Surgeon: Jerline Pain, MD;  Location: [place];  Service: Cardiovascular;  Laterality: N/A;  . TEE WITHOUT CARDIOVERSION N/A 12/20/2014   Procedure: TRANSESOPHAGEAL ECHOCARDIOGRAM (TEE);  Surgeon: Ivin Poot, MD;  Location: [place];  Service: Open Heart Surgery;  Laterality: N/A;        Home Medications    Prior to Admission medications   Medication Sig Start Date End Date Taking? Authorizing Provider  amoxicillin (AMOXIL) 500 MG capsule Take 4 capsules (2,000 mg total) by mouth as directed. 1 hour prior to dental appointment 07/28/16  Yes Larey Dresser, MD  aspirin EC 81 MG tablet Take 81 mg by mouth daily.   Yes [provider]  atovaquone (MEPRON) 750 MG/5ML suspension Take 1,500 mg by mouth daily.   Yes [provider]  calcium-vitamin D (OSCAL WITH D) 500-200 MG-UNIT tablet Take 1 tablet by mouth 2 (two) times daily.   Yes [provider]  citalopram (CELEXA) 20 MG tablet Take 1 tablet (20 mg total) by mouth daily. 12/09/16  Yes [name], Shaune Pascal, MD  citalopram (CELEXA) 20 MG tablet Take 20 mg by mouth daily.   Yes [provider]  clotrimazole (MYCELEX) 10 MG troche Take 10 mg by mouth 2 (two) times daily.   Yes [provider]  furosemide (LASIX) 40 MG tablet Take 1 tablet (40 mg total) by mouth daily as needed (weight gain or swelling). Patient taking differently: Take 40 mg by mouth 2 (two) times daily.  07/24/16  Yes [name], Shaune Pascal, MD  ganciclovir in sodium chloride 0.9 % 100 mL Inject 176.5 mg into the vein 2 (two) times daily. With sodium chloride 0.9% SolP 100 mL   Yes [provider]  isosorbide mononitrate (IMDUR) 30 MG 24 hr tablet Take 30 mg by mouth daily.   Yes [provider]  magnesium oxide (MAG-OX) 400 (241.3 Mg) MG tablet Take 800 mg by mouth 2 (two) times daily.   Yes [provider]  predniSONE (DELTASONE) 10 MG tablet Take 10 mg by mouth daily with breakfast.   Yes [provider]  sodium bicarbonate 650 MG tablet Take 650 mg by mouth 2 (two) times daily.   Yes [provider]  sodium chloride 0.9 % Inject 100 mLs into the vein 2 (two) times daily.   Yes [provider]  tacrolimus (PROGRAF) 1 MG capsule Take 2 mg by mouth 2 (two) times daily.   Yes [provider]    Family History Family History  Problem Relation Age of Onset  . Heart failure Brother     Social History Social History   Tobacco Use  . Smoking status: Never Smoker  . Smokeless tobacco: Never Used  Substance Use Topics  . Alcohol use: No  . Drug use: No     Allergies   Phytonadione   Review of  Systems Review of Systems  Constitutional: Negative for fever.  Respiratory: Negative for cough and shortness of breath.   Cardiovascular: Negative for chest pain.  Gastrointestinal: Negative for abdominal pain, blood in stool, diarrhea, nausea and vomiting.  Genitourinary: Negative for dysuria.  Skin: Negative for color change and rash.  All other systems reviewed and are negative.    Physical Exam Updated Vital Signs BP (!) 130/100   Pulse 99   Temp  99.1 F (37.3 C) (Oral)   Resp 18   SpO2 100%   Physical Exam  Constitutional: He is oriented to person, place, and time. No distress.  Chronically ill-appearing, no acute distress  HENT:  Head: Normocephalic and atraumatic.  Eyes: Pupils are equal, round, and reactive to light.  Cardiovascular: Regular rhythm and normal heart sounds.  No murmur heard. Tachycardia  Pulmonary/Chest: Effort normal and breath sounds normal. No respiratory distress. He has no wheezes.  Well-healed midline sternotomy scar  Abdominal: Soft. Bowel sounds are normal. There is no tenderness. There is no rebound.  Musculoskeletal:  Trace bilateral lower extremity edema  Neurological: He is alert and oriented to person, place, and time.  Skin: Skin is warm and dry.  PICC line right upper extremity, no adjacent erythema  Psychiatric: He has a normal mood and affect.  Nursing note and vitals reviewed.    ED Treatments / Results  Labs (all labs ordered are listed, but only abnormal results are displayed) Labs Reviewed  CBC WITH DIFFERENTIAL/PLATELET - Abnormal; Notable for the following components:      Result Value   WBC 2.6 (*)    RBC 3.13 (*)    Hemoglobin 8.0 (*)    HCT 26.9 (*)    MCH 25.6 (*)    MCHC 29.7 (*)    RDW 17.5 (*)    Neutro Abs 1.6 (*)    All other components within normal limits  COMPREHENSIVE METABOLIC PANEL - Abnormal; Notable for the following components:   Glucose, Bld 116 (*)    BUN 24 (*)    Creatinine, Ser 1.72 (*)     Calcium 8.1 (*)    Total Protein 5.3 (*)    Albumin 3.1 (*)    ALT 16 (*)    GFR calc non Af Amer 45 (*)    GFR calc Af Amer 52 (*)    All other components within normal limits  HEMOGLOBIN AND HEMATOCRIT, BLOOD - Abnormal; Notable for the following components:   Hemoglobin 6.8 (*)    HCT 22.7 (*)    All other components within normal limits  HEMOGLOBIN AND HEMATOCRIT, BLOOD  TYPE AND SCREEN    EKG EKG Interpretation  Date/Time:  Tuesday Aug 31 2017 21:33:44 EDT Ventricular Rate:  122 PR Interval:  124 QRS Duration: 132 QT Interval:  362 QTC Calculation: 515 R Axis:   -57 Text Interpretation:  Sinus tachycardia Right bundle branch block Left anterior fascicular block  Bifascicular block  Left ventricular hypertrophy with repolarization abnormality Abnormal ECG Confirmed by Th[place] Jew [phone]) on 08/31/2017 11:45:53 PM   Radiology Dg Chest Portable 1 View  Result Date: 09/01/2017 CLINICAL DATA:  Assess PICC position. Cannot flush PICC this morning. EXAM: PORTABLE CHEST 1 VIEW COMPARISON:  Chest radiograph performed 04/02/2016 FINDINGS: The patient's right PICC is noted ending about the mid SVC. A small left pleural effusion is noted. Vascular congestion is seen. Increased interstitial markings may reflect mild interstitial edema. There is no evidence of pneumothorax. The cardiomediastinal silhouette is mildly enlarged. The patient is status post median sternotomy. No acute osseous abnormalities are seen. IMPRESSION: 1. Right PICC noted ending about the mid SVC. 2. Vascular congestion and mild cardiomegaly. Increased interstitial markings may reflect mild interstitial edema. Small left pleural effusion noted. Electronically Signed   By: Garald Balding M.D.   On: 09/01/2017 02:12    Procedures Procedures (including critical care time)  CRITICAL CARE Performed by: Merryl Hacker   Total critical care  time: 30 minutes  Critical care time was exclusive of separately  billable procedures and treating other patients.  Critical care was necessary to treat or prevent imminent or life-threatening deterioration.  Critical care was time spent personally by me on the following activities: development of treatment plan with patient and/or surrogate as well as nursing, discussions with consultants, evaluation of patient's response to treatment, examination of patient, obtaining history from patient or surrogate, ordering and performing treatments and interventions, ordering and review of laboratory studies, ordering and review of radiographic studies, pulse oximetry and re-evaluation of patient's condition.   Medications Ordered in ED Medications  sodium chloride flush (NS) 0.9 % injection 10-40 mL (has no administration in time range)  sodium chloride flush (NS) 0.9 % injection 10-40 mL (10 mLs Intracatheter Given 08/31/17 2341)  alteplase (CATHFLO ACTIVASE) injection 2 mg (2 mg Intracatheter Given 08/31/17 2345)     Initial Impression / Assessment and Plan / ED Course  I have reviewed the triage vital signs and the nursing notes.  Pertinent labs & imaging results that were available during my care of the patient were reviewed by me and considered in my medical decision making (see chart for details).  Clinical Course as of Sep 01 221  Tue Aug 31, 2017  2341 Discussed with transplant coordinator given patient's hemoglobin of 8.  He is otherwise a symptom Runner, broadcasting/film/video.  She recommends trying to flush PICC line.  At this time she does not feel he needs to be emergently transfused; however, she will discuss with attending MD given patient's history and medical complexity.   [CH]  Wed Sep 01, 2017  0132 Spoke with Raquel Sarna, transplant coordinator.  Patient's hemoglobin is 6.8 on recheck.  Also unable to flush PICC line.  She was advised of these findings.  He has no signs of active bleeding at this time.  Request additional confirmatory repeat.  If consistently low, he will  need transfusion.  Given multiple issues, transferred to [facility] highly likely.  She will check on bed status and get back to me.   [CH]    Clinical Course User Index [CH] Keliah Harned, Barbette Hair, MD    Patient presents with concerns for occluded PICC line.  Of note, was notably tachycardic in triage.  This resulted in labs being taken.  Hemoglobin noted to be 8.0.  Discharge hemoglobin based on chart review was 9.  Patient denies any symptoms.  On my initial evaluation his heart rate is in the low 100s which is normal for him.  IV team requested to evaluate PICC line.  I had multiple discussions with the transplant coordinator at [facility] regarding his hemoglobin.  Repeat hemoglobin is 6.8.  Patient has no obvious active bleeding while in the emergency room.  However, significant recent GI bleed.  Will repeat to verify and type and screen.  Patient's PICC line remained occluded after TPA.  This was all relayed to the transplant coordinator.  Given dropping hemoglobin and PICC line complications, will transfer to [facility] to Dr. Mosetta Pigeon.  Patient's vital signs have remained stable while in the emergency department.  Chest x-ray was obtained to evaluate for PICC line location.  PICC line appears appropriately placed.  2:57 AM Repeat Hgb 7.3.  [facility] is here for transfer.  Will not delay transfer for transfusion as patient is clinically stable.  Final Clinical Impressions(s) / ED Diagnoses   Final diagnoses:  Anemia, unspecified type  Occluded PICC line, initial encounter [facility])    ED Discharge Orders  None       Merryl Hacker, MD 09/01/17 Reece Agar    Merryl Hacker, MD 09/01/17 [phone]

## 2017-08-31 NOTE — ED Triage Notes (Signed)
Patient stated that he can not flush his PICC line this morning , denies arm pain  / no fever or chills . Tachycardic at triage = 130's .

## 2017-08-31 NOTE — ED Notes (Signed)
IV Team at bedside 

## 2017-09-01 ENCOUNTER — Emergency Department (HOSPITAL_COMMUNITY): Payer: 59

## 2017-09-01 DIAGNOSIS — Z941 Heart transplant status: Secondary | ICD-10-CM | POA: Diagnosis not present

## 2017-09-01 DIAGNOSIS — R000 Tachycardia, unspecified: Secondary | ICD-10-CM | POA: Diagnosis not present

## 2017-09-01 DIAGNOSIS — T82898A Other specified complication of vascular prosthetic devices, implants and grafts, initial encounter: Secondary | ICD-10-CM | POA: Diagnosis present

## 2017-09-01 DIAGNOSIS — D649 Anemia, unspecified: Secondary | ICD-10-CM | POA: Diagnosis not present

## 2017-09-01 DIAGNOSIS — Y711 Therapeutic (nonsurgical) and rehabilitative cardiovascular devices associated with adverse incidents: Secondary | ICD-10-CM | POA: Diagnosis not present

## 2017-09-01 DIAGNOSIS — I110 Hypertensive heart disease with heart failure: Secondary | ICD-10-CM | POA: Diagnosis not present

## 2017-09-01 DIAGNOSIS — Z7982 Long term (current) use of aspirin: Secondary | ICD-10-CM | POA: Diagnosis not present

## 2017-09-01 DIAGNOSIS — I5022 Chronic systolic (congestive) heart failure: Secondary | ICD-10-CM | POA: Diagnosis not present

## 2017-09-01 DIAGNOSIS — Z79899 Other long term (current) drug therapy: Secondary | ICD-10-CM | POA: Diagnosis not present

## 2017-09-01 LAB — TYPE AND SCREEN
ABO/RH(D): O POS
ANTIBODY SCREEN: NEGATIVE

## 2017-09-01 LAB — HEMOGLOBIN AND HEMATOCRIT, BLOOD
HEMATOCRIT: 22.7 % — AB (ref 39.0–52.0)
HEMATOCRIT: 24.5 % — AB (ref 39.0–52.0)
HEMOGLOBIN: 7.3 g/dL — AB (ref 13.0–17.0)
Hemoglobin: 6.8 g/dL — CL (ref 13.0–17.0)

## 2017-09-01 NOTE — ED Notes (Signed)
Per IVT RN, PICC Line still  will not flush.

## 2017-09-01 NOTE — ED Notes (Signed)
Pt departed in NAD, and in care of [facility] team.

## 2017-09-01 NOTE — ED Notes (Signed)
IVT @ bedside

## 2017-09-15 ENCOUNTER — Inpatient Hospital Stay (HOSPITAL_COMMUNITY): Payer: 59

## 2017-09-15 ENCOUNTER — Encounter (HOSPITAL_COMMUNITY): Payer: Self-pay | Admitting: Certified Registered"

## 2017-09-15 ENCOUNTER — Emergency Department (HOSPITAL_COMMUNITY): Payer: 59 | Admitting: Anesthesiology

## 2017-09-15 ENCOUNTER — Emergency Department (HOSPITAL_COMMUNITY): Payer: 59

## 2017-09-15 ENCOUNTER — Other Ambulatory Visit: Payer: Self-pay

## 2017-09-15 ENCOUNTER — Inpatient Hospital Stay (HOSPITAL_COMMUNITY)
Admission: EM | Admit: 2017-09-15 | Discharge: 2017-09-15 | DRG: 329 | Disposition: A | Discharge status: Other Acute Inpatient Hospital | Payer: 59 | Attending: Surgery | Admitting: Surgery

## 2017-09-15 ENCOUNTER — Encounter (HOSPITAL_COMMUNITY)
Admission: EM | Disposition: A | Discharge status: Other Acute Inpatient Hospital | Payer: Self-pay | Source: Home / Self Care | Attending: Surgery

## 2017-09-15 DIAGNOSIS — G9349 Other encephalopathy: Secondary | ICD-10-CM | POA: Diagnosis not present

## 2017-09-15 DIAGNOSIS — I482 Chronic atrial fibrillation, unspecified: Secondary | ICD-10-CM

## 2017-09-15 DIAGNOSIS — Z8249 Family history of ischemic heart disease and other diseases of the circulatory system: Secondary | ICD-10-CM

## 2017-09-15 DIAGNOSIS — Z9049 Acquired absence of other specified parts of digestive tract: Secondary | ICD-10-CM | POA: Diagnosis not present

## 2017-09-15 DIAGNOSIS — Z8674 Personal history of sudden cardiac arrest: Secondary | ICD-10-CM

## 2017-09-15 DIAGNOSIS — Z95828 Presence of other vascular implants and grafts: Secondary | ICD-10-CM | POA: Diagnosis not present

## 2017-09-15 DIAGNOSIS — Z01818 Encounter for other preprocedural examination: Secondary | ICD-10-CM

## 2017-09-15 DIAGNOSIS — Z941 Heart transplant status: Secondary | ICD-10-CM

## 2017-09-15 DIAGNOSIS — Z8619 Personal history of other infectious and parasitic diseases: Secondary | ICD-10-CM

## 2017-09-15 DIAGNOSIS — F329 Major depressive disorder, single episode, unspecified: Secondary | ICD-10-CM

## 2017-09-15 DIAGNOSIS — M1000 Idiopathic gout, unspecified site: Secondary | ICD-10-CM

## 2017-09-15 DIAGNOSIS — K5720 Diverticulitis of large intestine with perforation and abscess without bleeding: Secondary | ICD-10-CM | POA: Diagnosis not present

## 2017-09-15 DIAGNOSIS — K659 Peritonitis, unspecified: Secondary | ICD-10-CM

## 2017-09-15 DIAGNOSIS — F4321 Adjustment disorder with depressed mood: Secondary | ICD-10-CM | POA: Diagnosis present

## 2017-09-15 DIAGNOSIS — Z9889 Other specified postprocedural states: Secondary | ICD-10-CM

## 2017-09-15 DIAGNOSIS — R0689 Other abnormalities of breathing: Secondary | ICD-10-CM | POA: Diagnosis not present

## 2017-09-15 DIAGNOSIS — R000 Tachycardia, unspecified: Secondary | ICD-10-CM | POA: Diagnosis present

## 2017-09-15 DIAGNOSIS — I5022 Chronic systolic (congestive) heart failure: Secondary | ICD-10-CM | POA: Diagnosis present

## 2017-09-15 DIAGNOSIS — E010 Iodine-deficiency related diffuse (endemic) goiter: Secondary | ICD-10-CM | POA: Diagnosis present

## 2017-09-15 DIAGNOSIS — R1084 Generalized abdominal pain: Secondary | ICD-10-CM | POA: Diagnosis present

## 2017-09-15 DIAGNOSIS — B258 Other cytomegaloviral diseases: Secondary | ICD-10-CM | POA: Diagnosis present

## 2017-09-15 DIAGNOSIS — E785 Hyperlipidemia, unspecified: Secondary | ICD-10-CM | POA: Diagnosis present

## 2017-09-15 DIAGNOSIS — I5023 Acute on chronic systolic (congestive) heart failure: Secondary | ICD-10-CM

## 2017-09-15 DIAGNOSIS — A0839 Other viral enteritis: Secondary | ICD-10-CM | POA: Diagnosis present

## 2017-09-15 DIAGNOSIS — I130 Hypertensive heart and chronic kidney disease with heart failure and stage 1 through stage 4 chronic kidney disease, or unspecified chronic kidney disease: Secondary | ICD-10-CM | POA: Diagnosis present

## 2017-09-15 DIAGNOSIS — I428 Other cardiomyopathies: Secondary | ICD-10-CM | POA: Diagnosis present

## 2017-09-15 DIAGNOSIS — N183 Chronic kidney disease, stage 3 (moderate): Secondary | ICD-10-CM | POA: Diagnosis present

## 2017-09-15 DIAGNOSIS — N179 Acute kidney failure, unspecified: Secondary | ICD-10-CM | POA: Diagnosis present

## 2017-09-15 DIAGNOSIS — K668 Other specified disorders of peritoneum: Secondary | ICD-10-CM

## 2017-09-15 DIAGNOSIS — Z7952 Long term (current) use of systemic steroids: Secondary | ICD-10-CM

## 2017-09-15 DIAGNOSIS — Z95810 Presence of automatic (implantable) cardiac defibrillator: Secondary | ICD-10-CM

## 2017-09-15 DIAGNOSIS — F32A Depression, unspecified: Secondary | ICD-10-CM

## 2017-09-15 DIAGNOSIS — D649 Anemia, unspecified: Secondary | ICD-10-CM | POA: Diagnosis present

## 2017-09-15 DIAGNOSIS — M069 Rheumatoid arthritis, unspecified: Secondary | ICD-10-CM | POA: Diagnosis present

## 2017-09-15 DIAGNOSIS — K650 Generalized (acute) peritonitis: Secondary | ICD-10-CM | POA: Diagnosis present

## 2017-09-15 DIAGNOSIS — Z8679 Personal history of other diseases of the circulatory system: Secondary | ICD-10-CM

## 2017-09-15 DIAGNOSIS — I452 Bifascicular block: Secondary | ICD-10-CM | POA: Diagnosis present

## 2017-09-15 DIAGNOSIS — Z79899 Other long term (current) drug therapy: Secondary | ICD-10-CM

## 2017-09-15 DIAGNOSIS — K631 Perforation of intestine (nontraumatic): Secondary | ICD-10-CM | POA: Diagnosis present

## 2017-09-15 DIAGNOSIS — Z8719 Personal history of other diseases of the digestive system: Secondary | ICD-10-CM

## 2017-09-15 HISTORY — PX: ILEOSTOMY: SHX1783

## 2017-09-15 HISTORY — PX: LAPAROTOMY: SHX154

## 2017-09-15 LAB — URINALYSIS, ROUTINE W REFLEX MICROSCOPIC
BILIRUBIN URINE: NEGATIVE
Glucose, UA: NEGATIVE mg/dL
HGB URINE DIPSTICK: NEGATIVE
KETONES UR: 5 mg/dL — AB
LEUKOCYTES UA: NEGATIVE
Nitrite: NEGATIVE
PROTEIN: 30 mg/dL — AB
Specific Gravity, Urine: 1.014 (ref 1.005–1.030)
pH: 5 (ref 5.0–8.0)

## 2017-09-15 LAB — CBC
HCT: 31.8 % — ABNORMAL LOW (ref 39.0–52.0)
HEMATOCRIT: 33.4 % — AB (ref 39.0–52.0)
HEMOGLOBIN: 10.2 g/dL — AB (ref 13.0–17.0)
Hemoglobin: 9.8 g/dL — ABNORMAL LOW (ref 13.0–17.0)
MCH: 25.3 pg — ABNORMAL LOW (ref 26.0–34.0)
MCH: 25.5 pg — ABNORMAL LOW (ref 26.0–34.0)
MCHC: 30.5 g/dL (ref 30.0–36.0)
MCHC: 30.8 g/dL (ref 30.0–36.0)
MCV: 82.9 fL (ref 78.0–100.0)
MCV: 82.6 fL (ref 78.0–100.0)
PLATELETS: 160 10*3/uL (ref 150–400)
Platelets: 202 10*3/uL (ref 150–400)
RBC: 4.03 MIL/uL — ABNORMAL LOW (ref 4.22–5.81)
RBC: 3.85 MIL/uL — ABNORMAL LOW (ref 4.22–5.81)
RDW: 18.4 % — AB (ref 11.5–15.5)
RDW: 18.2 % — AB (ref 11.5–15.5)
WBC: 15.3 10*3/uL — ABNORMAL HIGH (ref 4.0–10.5)
WBC: 7.1 10*3/uL (ref 4.0–10.5)

## 2017-09-15 LAB — POCT I-STAT 7, (LYTES, BLD GAS, ICA,H+H)
Acid-base deficit: 7 mmol/L — ABNORMAL HIGH (ref 0.0–2.0)
Acid-base deficit: 8 mmol/L — ABNORMAL HIGH (ref 0.0–2.0)
BICARBONATE: 19.5 mmol/L — AB (ref 20.0–28.0)
Bicarbonate: 17.1 mmol/L — ABNORMAL LOW (ref 20.0–28.0)
CALCIUM ION: 1.25 mmol/L (ref 1.15–1.40)
Calcium, Ion: 1.2 mmol/L (ref 1.15–1.40)
HCT: 27 % — ABNORMAL LOW (ref 39.0–52.0)
HEMATOCRIT: 28 % — AB (ref 39.0–52.0)
Hemoglobin: 9.5 g/dL — ABNORMAL LOW (ref 13.0–17.0)
Hemoglobin: 9.2 g/dL — ABNORMAL LOW (ref 13.0–17.0)
O2 SAT: 100 %
O2 Saturation: 100 %
PH ART: 7.243 — AB (ref 7.350–7.450)
POTASSIUM: 4.3 mmol/L (ref 3.5–5.1)
Patient temperature: 36
Potassium: 4.1 mmol/L (ref 3.5–5.1)
SODIUM: 143 mmol/L (ref 135–145)
Sodium: 141 mmol/L (ref 135–145)
TCO2: 21 mmol/L — ABNORMAL LOW (ref 22–32)
TCO2: 18 mmol/L — ABNORMAL LOW (ref 22–32)
pCO2 arterial: 45.2 mmHg (ref 32.0–48.0)
pCO2 arterial: 30.6 mmHg — ABNORMAL LOW (ref 32.0–48.0)
pH, Arterial: 7.351 (ref 7.350–7.450)
pO2, Arterial: 463 mmHg — ABNORMAL HIGH (ref 83.0–108.0)
pO2, Arterial: 233 mmHg — ABNORMAL HIGH (ref 83.0–108.0)

## 2017-09-15 LAB — DIFFERENTIAL
BASOS ABS: 0 10*3/uL (ref 0.0–0.1)
Band Neutrophils: 0 %
Basophils Relative: 0 %
Blasts: 0 %
EOS ABS: 0 10*3/uL (ref 0.0–0.7)
Eosinophils Relative: 0 %
LYMPHS ABS: 0.1 10*3/uL — AB (ref 0.7–4.0)
Lymphocytes Relative: 1 %
METAMYELOCYTES PCT: 0 %
MYELOCYTES: 0 %
Monocytes Absolute: 0.1 10*3/uL (ref 0.1–1.0)
Monocytes Relative: 1 %
NEUTROS ABS: 6.9 10*3/uL (ref 1.7–7.7)
NEUTROS PCT: 98 %
Other: 0 %
Promyelocytes Relative: 0 %
nRBC: 0 /100 WBC

## 2017-09-15 LAB — COMPREHENSIVE METABOLIC PANEL
ALBUMIN: 3.6 g/dL (ref 3.5–5.0)
ALBUMIN: 2.5 g/dL — AB (ref 3.5–5.0)
ALT: 20 U/L (ref 17–63)
ALT: 19 U/L (ref 17–63)
AST: 17 U/L (ref 15–41)
AST: 16 U/L (ref 15–41)
Alkaline Phosphatase: 111 U/L (ref 38–126)
Alkaline Phosphatase: 95 U/L (ref 38–126)
Anion gap: 13 (ref 5–15)
Anion gap: 11 (ref 5–15)
BILIRUBIN TOTAL: 2.2 mg/dL — AB (ref 0.3–1.2)
BUN: 65 mg/dL — AB (ref 6–20)
BUN: 66 mg/dL — AB (ref 6–20)
CHLORIDE: 108 mmol/L (ref 101–111)
CO2: 22 mmol/L (ref 22–32)
CO2: 18 mmol/L — ABNORMAL LOW (ref 22–32)
CREATININE: 2.23 mg/dL — AB (ref 0.61–1.24)
Calcium: 9.6 mg/dL (ref 8.9–10.3)
Calcium: 8.5 mg/dL — ABNORMAL LOW (ref 8.9–10.3)
Chloride: 114 mmol/L — ABNORMAL HIGH (ref 101–111)
Creatinine, Ser: 2.56 mg/dL — ABNORMAL HIGH (ref 0.61–1.24)
GFR calc Af Amer: 32 mL/min — ABNORMAL LOW (ref >60)
GFR calc Af Amer: 38 mL/min — ABNORMAL LOW (ref >60)
GFR calc non Af Amer: 28 mL/min — ABNORMAL LOW (ref >60)
GFR, EST NON AFRICAN AMERICAN: 33 mL/min — AB (ref >60)
GLUCOSE: 110 mg/dL — AB (ref 65–99)
GLUCOSE: 113 mg/dL — AB (ref 65–99)
POTASSIUM: 4.5 mmol/L (ref 3.5–5.1)
POTASSIUM: 4.1 mmol/L (ref 3.5–5.1)
Sodium: 143 mmol/L (ref 135–145)
Sodium: 143 mmol/L (ref 135–145)
TOTAL PROTEIN: 4.7 g/dL — AB (ref 6.5–8.1)
Total Bilirubin: 1.5 mg/dL — ABNORMAL HIGH (ref 0.3–1.2)
Total Protein: 6.7 g/dL (ref 6.5–8.1)

## 2017-09-15 LAB — POCT I-STAT 3, ART BLOOD GAS (G3+)
ACID-BASE DEFICIT: 7 mmol/L — AB (ref 0.0–2.0)
Bicarbonate: 19.9 mmol/L — ABNORMAL LOW (ref 20.0–28.0)
O2 Saturation: 100 %
PH ART: 7.229 — AB (ref 7.350–7.450)
TCO2: 21 mmol/L — ABNORMAL LOW (ref 22–32)
pCO2 arterial: 47.7 mmHg (ref 32.0–48.0)
pO2, Arterial: 496 mmHg — ABNORMAL HIGH (ref 83.0–108.0)

## 2017-09-15 LAB — I-STAT CG4 LACTIC ACID, ED
LACTIC ACID, VENOUS: 0.84 mmol/L (ref 0.5–1.9)
Lactic Acid, Venous: 1.54 mmol/L (ref 0.5–1.9)

## 2017-09-15 LAB — POC OCCULT BLOOD, ED: FECAL OCCULT BLD: POSITIVE — AB

## 2017-09-15 LAB — I-STAT TROPONIN, ED: TROPONIN I, POC: 0.08 ng/mL (ref 0.00–0.08)

## 2017-09-15 LAB — TRIGLYCERIDES: TRIGLYCERIDES: 257 mg/dL — AB (ref <150)

## 2017-09-15 LAB — LACTIC ACID, PLASMA: Lactic Acid, Venous: 0.6 mmol/L (ref 0.5–1.9)

## 2017-09-15 LAB — LIPASE, BLOOD: LIPASE: 22 U/L (ref 11–51)

## 2017-09-15 SURGERY — LAPAROTOMY, EXPLORATORY
Anesthesia: General | Site: Abdomen

## 2017-09-15 MED ORDER — PROPOFOL 1000 MG/100ML IV EMUL: 0.0000 ug/kg/min | INTRAVENOUS | Status: DC

## 2017-09-15 MED ORDER — SODIUM CHLORIDE 0.9 % IV SOLN
75.0000 mL | INTRAVENOUS | 0 refills | Status: DC
Start: 2017-09-15 — End: 2021-05-23

## 2017-09-15 MED ORDER — DOCUSATE SODIUM 50 MG/5ML PO LIQD: 100.0000 mg | Freq: Two times a day (BID) | ORAL | 0 refills | Status: DC | PRN

## 2017-09-15 MED ORDER — PREDNISONE 10 MG PO TABS: ORAL_TABLET | ORAL | Status: DC

## 2017-09-15 MED ORDER — ATOVAQUONE 750 MG/5ML PO SUSP
1500.0000 mg | Freq: Every day | ORAL | Status: DC
  Filled 2017-09-15: qty 10

## 2017-09-15 MED ORDER — ATOVAQUONE 750 MG/5ML PO SUSP: 1500.0000 mg | Freq: Every day | ORAL | 0 refills | Status: DC

## 2017-09-15 MED ORDER — METRONIDAZOLE IN NACL 5-0.79 MG/ML-% IV SOLN: 500.0000 mg | Freq: Three times a day (TID) | INTRAVENOUS | Status: DC

## 2017-09-15 MED ORDER — FENTANYL CITRATE (PF) 100 MCG/2ML IJ SOLN
INTRAMUSCULAR | Status: DC | PRN
  Administered 2017-09-15: 100 ug via INTRAVENOUS
  Administered 2017-09-15: 50 ug via INTRAVENOUS
  Administered 2017-09-15 (×2): 100 ug via INTRAVENOUS
  Administered 2017-09-15: 50 ug via INTRAVENOUS
  Administered 2017-09-15: 100 ug via INTRAVENOUS

## 2017-09-15 MED ORDER — ROCURONIUM BROMIDE 100 MG/10ML IV SOLN
INTRAVENOUS | Status: DC | PRN
  Administered 2017-09-15: 100 mg via INTRAVENOUS

## 2017-09-15 MED ORDER — PREDNISONE 5 MG/5ML PO SOLN
10.0000 mg | Freq: Every day | ORAL | Status: DC
  Filled 2017-09-15 (×2): qty 10

## 2017-09-15 MED ORDER — SUCCINYLCHOLINE CHLORIDE 20 MG/ML IJ SOLN
INTRAMUSCULAR | Status: DC | PRN
  Administered 2017-09-15: 60 mg via INTRAVENOUS

## 2017-09-15 MED ORDER — ENOXAPARIN SODIUM 40 MG/0.4ML ~~LOC~~ SOLN: 40.0000 mg | SUBCUTANEOUS | Status: DC

## 2017-09-15 MED ORDER — FENTANYL 2500MCG IN NS 250ML (10MCG/ML) PREMIX INFUSION: 25.0000 ug/h | INTRAVENOUS | Status: DC

## 2017-09-15 MED ORDER — FENTANYL 2500MCG IN NS 250ML (10MCG/ML) PREMIX INFUSION
25.0000 ug/h | INTRAVENOUS | Status: DC
  Administered 2017-09-15: 50 ug/h via INTRAVENOUS
  Filled 2017-09-15: qty 250

## 2017-09-15 MED ORDER — PROPOFOL 1000 MG/100ML IV EMUL: 5.0000 ug/kg/min | INTRAVENOUS | Status: DC

## 2017-09-15 MED ORDER — HYDROMORPHONE HCL 2 MG/ML IJ SOLN
0.5000 mg | Freq: Once | INTRAMUSCULAR | Status: AC
  Administered 2017-09-15: 0.5 mg via INTRAVENOUS

## 2017-09-15 MED ORDER — CHLORHEXIDINE GLUCONATE 0.12% ORAL RINSE (MEDLINE KIT): 15.0000 mL | Freq: Two times a day (BID) | OROMUCOSAL | Status: DC

## 2017-09-15 MED ORDER — FENTANYL BOLUS VIA INFUSION
50.0000 ug | INTRAVENOUS | Status: DC | PRN
  Filled 2017-09-15: qty 50

## 2017-09-15 MED ORDER — PANTOPRAZOLE SODIUM 40 MG PO TBEC: DELAYED_RELEASE_TABLET | ORAL | 11 refills | Status: DC

## 2017-09-15 MED ORDER — 0.9 % SODIUM CHLORIDE (POUR BTL) OPTIME
TOPICAL | Status: DC | PRN
  Administered 2017-09-15 (×6): 1000 mL

## 2017-09-15 MED ORDER — FENTANYL CITRATE (PF) 250 MCG/5ML IJ SOLN
INTRAMUSCULAR | Status: AC
  Filled 2017-09-15: qty 5

## 2017-09-15 MED ORDER — FAMOTIDINE IN NACL 20-0.9 MG/50ML-% IV SOLN
20.0000 mg | Freq: Every day | INTRAVENOUS | Status: DC
  Administered 2017-09-15: 20 mg via INTRAVENOUS
  Filled 2017-09-15: qty 50

## 2017-09-15 MED ORDER — SODIUM CHLORIDE 0.9 % IV BOLUS
500.0000 mL | Freq: Once | INTRAVENOUS | Status: AC
  Administered 2017-09-15: 500 mL via INTRAVENOUS

## 2017-09-15 MED ORDER — PROPOFOL 10 MG/ML IV BOLUS
INTRAVENOUS | Status: AC
  Filled 2017-09-15: qty 20

## 2017-09-15 MED ORDER — DOCUSATE SODIUM 50 MG/5ML PO LIQD: 100.0000 mg | Freq: Two times a day (BID) | ORAL | Status: DC | PRN

## 2017-09-15 MED ORDER — VANCOMYCIN HCL 10 G IV SOLR
1250.0000 mg | Freq: Once | INTRAVENOUS | Status: AC
  Administered 2017-09-15: 1250 mg via INTRAVENOUS
  Filled 2017-09-15: qty 1250

## 2017-09-15 MED ORDER — CARVEDILOL 3.125 MG PO TABS: ORAL_TABLET | ORAL | 11 refills | Status: DC

## 2017-09-15 MED ORDER — MIDAZOLAM HCL 5 MG/5ML IJ SOLN
INTRAMUSCULAR | Status: DC | PRN
  Administered 2017-09-15: 2 mg via INTRAVENOUS

## 2017-09-15 MED ORDER — INSULIN ASPART 100 UNIT/ML ~~LOC~~ SOLN
1.0000 [IU] | SUBCUTANEOUS | Status: DC
Start: 2017-09-15 — End: 2017-09-15

## 2017-09-15 MED ORDER — POTASSIUM CHLORIDE IN NACL 20-0.9 MEQ/L-% IV SOLN
INTRAVENOUS | Status: DC
  Filled 2017-09-15: qty 1000

## 2017-09-15 MED ORDER — EPINEPHRINE PF 1 MG/ML IJ SOLN
0.5000 ug/min | INTRAMUSCULAR | Status: DC
  Filled 2017-09-15: qty 4

## 2017-09-15 MED ORDER — CITALOPRAM HYDROBROMIDE 20 MG PO TABS: ORAL_TABLET | ORAL | 11 refills | Status: DC

## 2017-09-15 MED ORDER — MILRINONE LACTATE IN DEXTROSE 20-5 MG/100ML-% IV SOLN
0.1250 ug/kg/min | INTRAVENOUS | Status: DC
  Filled 2017-09-15 (×2): qty 100

## 2017-09-15 MED ORDER — FENTANYL CITRATE (PF) 250 MCG/5ML IJ SOLN
INTRAMUSCULAR | Status: AC
Start: 2017-09-15 — End: ?
  Filled 2017-09-15: qty 5

## 2017-09-15 MED ORDER — LIDOCAINE HCL (CARDIAC) PF 100 MG/5ML IV SOSY
PREFILLED_SYRINGE | INTRAVENOUS | Status: DC | PRN
  Administered 2017-09-15: 60 mg via INTRAVENOUS

## 2017-09-15 MED ORDER — NOREPINEPHRINE 4 MG/250ML-% IV SOLN
0.0000 ug/min | INTRAVENOUS | Status: DC
  Filled 2017-09-15 (×2): qty 250

## 2017-09-15 MED ORDER — FENTANYL BOLUS VIA INFUSION: 50.0000 ug | INTRAVENOUS | 0 refills | Status: DC | PRN

## 2017-09-15 MED ORDER — FENTANYL CITRATE (PF) 100 MCG/2ML IJ SOLN
50.0000 ug | Freq: Once | INTRAMUSCULAR | Status: AC
  Administered 2017-09-15: 50 ug via INTRAVENOUS

## 2017-09-15 MED ORDER — FENTANYL CITRATE (PF) 100 MCG/2ML IJ SOLN
50.0000 ug | INTRAMUSCULAR | Status: AC | PRN
  Administered 2017-09-15 (×2): 50 ug via NASAL
  Filled 2017-09-15 (×2): qty 2

## 2017-09-15 MED ORDER — CEFOTETAN DISODIUM-DEXTROSE 2-2.08 GM-%(50ML) IV SOLR
2.0000 g | INTRAVENOUS | Status: DC
Start: 2017-09-15 — End: 2017-09-15
  Filled 2017-09-15: qty 50

## 2017-09-15 MED ORDER — ATOVAQUONE 750 MG/5ML PO SUSP: ORAL | 0 refills | Status: DC

## 2017-09-15 MED ORDER — PROPOFOL 10 MG/ML IV BOLUS
INTRAVENOUS | Status: DC | PRN
  Administered 2017-09-15: 50 mg via INTRAVENOUS

## 2017-09-15 MED ORDER — FAMOTIDINE IN NACL 20-0.9 MG/50ML-% IV SOLN: 20.0000 mg | Freq: Every day | INTRAVENOUS | Status: DC

## 2017-09-15 MED ORDER — SODIUM CHLORIDE 0.9 % IV SOLN: 2.0000 g | Freq: Two times a day (BID) | INTRAVENOUS | Status: DC

## 2017-09-15 MED ORDER — ORAL CARE MOUTH RINSE
15.0000 mL | OROMUCOSAL | Status: DC
  Administered 2017-09-15: 15 mL via OROMUCOSAL

## 2017-09-15 MED ORDER — METRONIDAZOLE IN NACL 5-0.79 MG/ML-% IV SOLN
500.0000 mg | Freq: Three times a day (TID) | INTRAVENOUS | Status: DC
  Administered 2017-09-15: 500 mg via INTRAVENOUS
  Filled 2017-09-15: qty 100

## 2017-09-15 MED ORDER — MIDAZOLAM HCL 2 MG/2ML IJ SOLN
INTRAMUSCULAR | Status: AC
  Filled 2017-09-15: qty 2

## 2017-09-15 MED ORDER — HYDROMORPHONE HCL 2 MG/ML IJ SOLN: 0.3000 mg | Freq: Once | INTRAMUSCULAR | Status: DC

## 2017-09-15 MED ORDER — SODIUM CHLORIDE 0.9 % IV SOLN
INTRAVENOUS | Status: DC
  Administered 2017-09-15: 15:00:00 via INTRAVENOUS

## 2017-09-15 MED ORDER — HYDROMORPHONE HCL 2 MG/ML IJ SOLN
0.5000 mg | Freq: Once | INTRAMUSCULAR | Status: AC
  Administered 2017-09-15: 0.5 mg via INTRAVENOUS
  Filled 2017-09-15: qty 1

## 2017-09-15 MED ORDER — CEFEPIME HCL 1 G IJ SOLR
1.0000 g | INTRAMUSCULAR | Status: DC
  Administered 2017-09-15: 1 g via INTRAVENOUS
  Filled 2017-09-15: qty 1

## 2017-09-15 MED ORDER — TACROLIMUS 1 MG PO CAPS: ORAL_CAPSULE | ORAL | Status: DC

## 2017-09-15 MED ORDER — SODIUM CHLORIDE 0.9 % IV SOLN
2.0000 g | Freq: Two times a day (BID) | INTRAVENOUS | Status: DC
  Filled 2017-09-15 (×2): qty 2

## 2017-09-15 MED ORDER — LACTATED RINGERS IV SOLN
INTRAVENOUS | Status: DC | PRN
  Administered 2017-09-15 (×2): via INTRAVENOUS

## 2017-09-15 MED ORDER — PROPOFOL 1000 MG/100ML IV EMUL
5.0000 ug/kg/min | INTRAVENOUS | Status: DC
  Administered 2017-09-15: 75 ug/kg/min via INTRAVENOUS
  Filled 2017-09-15: qty 100

## 2017-09-15 MED ORDER — PROPOFOL 500 MG/50ML IV EMUL
INTRAVENOUS | Status: DC | PRN
  Administered 2017-09-15: 75 ug/kg/min via INTRAVENOUS

## 2017-09-15 MED ORDER — PREDNISONE 5 MG/5ML PO SOLN: 10.0000 mg | Freq: Every day | ORAL | 0 refills | Status: DC

## 2017-09-15 MED ORDER — VANCOMYCIN HCL IN DEXTROSE 1-5 GM/200ML-% IV SOLN: 1000.0000 mg | INTRAVENOUS | Status: DC

## 2017-09-15 SURGICAL SUPPLY — 52 items
BLADE CLIPPER SURG (BLADE) IMPLANT
BNDG GAUZE ELAST 4 BULKY (GAUZE/BANDAGES/DRESSINGS) ×2 IMPLANT
CANISTER SUCT 3000ML PPV (MISCELLANEOUS) ×4 IMPLANT
COVER SURGICAL LIGHT HANDLE (MISCELLANEOUS) ×2 IMPLANT
DRAPE LAPAROSCOPIC ABDOMINAL (DRAPES) ×2 IMPLANT
DRAPE WARM FLUID 44X44 (DRAPE) ×2 IMPLANT
DRSG OPSITE POSTOP 4X10 (GAUZE/BANDAGES/DRESSINGS) IMPLANT
DRSG OPSITE POSTOP 4X8 (GAUZE/BANDAGES/DRESSINGS) IMPLANT
ELECT BLADE 6.5 EXT (BLADE) ×2 IMPLANT
ELECT CAUTERY BLADE 6.4 (BLADE) ×2 IMPLANT
ELECT REM PT RETURN 9FT ADLT (ELECTROSURGICAL) ×2
ELECTRODE REM PT RTRN 9FT ADLT (ELECTROSURGICAL) ×1 IMPLANT
GLOVE BIO SURGEON STRL SZ7 (GLOVE) ×2 IMPLANT
GLOVE BIOGEL PI IND STRL 6.5 (GLOVE) ×1 IMPLANT
GLOVE BIOGEL PI IND STRL 7.0 (GLOVE) ×1 IMPLANT
GLOVE BIOGEL PI IND STRL 8 (GLOVE) ×2 IMPLANT
GLOVE BIOGEL PI INDICATOR 6.5 (GLOVE) ×1
GLOVE BIOGEL PI INDICATOR 7.0 (GLOVE) ×1
GLOVE BIOGEL PI INDICATOR 8 (GLOVE) ×2
GLOVE SURG SIGNA 7.5 PF LTX (GLOVE) ×2 IMPLANT
GLOVE SURG SS PI 6.5 STRL IVOR (GLOVE) ×2 IMPLANT
GOWN STRL REUS W/ TWL LRG LVL3 (GOWN DISPOSABLE) ×2 IMPLANT
GOWN STRL REUS W/ TWL XL LVL3 (GOWN DISPOSABLE) ×1 IMPLANT
GOWN STRL REUS W/TWL LRG LVL3 (GOWN DISPOSABLE) ×2
GOWN STRL REUS W/TWL XL LVL3 (GOWN DISPOSABLE) ×1
KIT BASIN OR (CUSTOM PROCEDURE TRAY) ×2 IMPLANT
KIT TURNOVER KIT B (KITS) ×2 IMPLANT
LIGASURE IMPACT 36 18CM CVD LR (INSTRUMENTS) ×2 IMPLANT
NS IRRIG 1000ML POUR BTL (IV SOLUTION) ×4 IMPLANT
PACK GENERAL/GYN (CUSTOM PROCEDURE TRAY) ×2 IMPLANT
PAD ABD 8X10 STRL (GAUZE/BANDAGES/DRESSINGS) ×2 IMPLANT
PAD ARMBOARD 7.5X6 YLW CONV (MISCELLANEOUS) ×2 IMPLANT
RELOAD PROXIMATE 75MM BLUE (ENDOMECHANICALS) ×2 IMPLANT
SPECIMEN JAR LARGE (MISCELLANEOUS) ×2 IMPLANT
SPONGE LAP 18X18 X RAY DECT (DISPOSABLE) ×8 IMPLANT
STAPLER PROXIMATE 75MM BLUE (STAPLE) ×2 IMPLANT
STAPLER VISISTAT 35W (STAPLE) ×2 IMPLANT
SUCTION POOLE TIP (SUCTIONS) ×2 IMPLANT
SUT NOVA 1 T20/GS 25DT (SUTURE) ×2 IMPLANT
SUT PDS AB 1 TP1 96 (SUTURE) ×4 IMPLANT
SUT PROLENE 2 0 CT2 30 (SUTURE) ×2 IMPLANT
SUT SILK 2 0 SH CR/8 (SUTURE) ×2 IMPLANT
SUT SILK 2 0 TIES 10X30 (SUTURE) ×2 IMPLANT
SUT SILK 3 0 SH CR/8 (SUTURE) ×2 IMPLANT
SUT SILK 3 0 TIES 10X30 (SUTURE) ×2 IMPLANT
SUT VIC AB 3-0 SH 18 (SUTURE) ×2 IMPLANT
SUT VIC AB 3-0 SH 27 (SUTURE) ×1
SUT VIC AB 3-0 SH 27XBRD (SUTURE) ×1 IMPLANT
TOWEL OR 17X24 6PK STRL BLUE (TOWEL DISPOSABLE) ×2 IMPLANT
TOWEL OR 17X26 10 PK STRL BLUE (TOWEL DISPOSABLE) IMPLANT
TRAY FOLEY MTR SLVR 16FR STAT (SET/KITS/TRAYS/PACK) ×2 IMPLANT
YANKAUER SUCT BULB TIP NO VENT (SUCTIONS) ×2 IMPLANT

## 2017-09-15 NOTE — ED Provider Notes (Addendum)
[facility] EMERGENCY DEPARTMENT Provider Note   CSN: [id number] Arrival date & time: 09/15/17  0140     History   Chief Complaint Chief Complaint  Patient presents with  . Abdominal Pain    HPI [name] is a 47 y.o. male.  The history is provided by the patient. No language interpreter was used.  Abdominal Pain      KUPER RENNELS is a 47 y.o. male who presents to the Emergency Department complaining of abdominal pain. Patient here for evaluation of sudden onset severe generalized abdominal pain that began today. He denies any fevers, nausea, vomiting, diarrhea, dysuria. He does have some mild shortness of breath that he relates to the severe pain. He has a history of heart transplant in January and is currently on Picc line Gancyclovir for viral suppression following CMV colitis admission two weeks ago.  He states pain is similar to colitis pain but much more severe.   Past Medical History:  Diagnosis Date  . AICD (automatic cardioverter/defibrillator) present   . AKI (acute kidney injury) ([place])   . Atrial fibrillation -parosysmal     Rx w amiodarone  . Automatic implantable cardiac defibrillator -BSX    single chamber  . CHF (congestive heart failure) ([place])   . Chronic systolic heart failure (HCC)    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%)  . Elevated LFTs   . GI bleed -massive    11 Units 2012  . Gout   . H/O hyperthyroidism   . Hypertension   . Noncompliance    H/O  MEDICAL NONCOMPLIANCE  . 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 Polymorphic ventricular tachycardia (HCC)    RECURRENT WITH APPROPRIATE SHOCK THERAPY IN THE PAST  . RA (rheumatoid arthritis) ([place])   . Severe mitral regurgitation   . Tricuspid valve regurgitation    SEVERE  . Ventricular fibrillation ([place])    WITH APPROPRIATE SHOCK THERAPY IN THE PAST    Patient Active Problem List   Diagnosis Date Noted  . PAF  (paroxysmal atrial fibrillation) ([place]) 11/18/2015  . Wound infection   . Debility 09/25/2015  . Debilitated 09/25/2015  . Chronic systolic congestive heart failure ([place])   . Colitis   . Septic shock ([place])   . Enteritis due to Clostridium difficile   . Staphylococcus aureus bacteremia   . 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[place])   . Abscess of abdominal wall   . Acute on chronic systolic (congestive) heart failure ([place])   . Left ventricular assist device (LVAD) complication 50/53/9767  . Epistaxis 05/21/2015  . Situational depression 04/13/2015  . Acute gout 01/15/2015  . Palliative care encounter 12/17/2014  . Cardiogenic shock ([place])   . Acute on chronic systolic CHF (congestive heart failure) ([place])   . Acute on chronic renal failure ([place])   . Chronic atrial fibrillation ([place])   . Low output heart failure ([place]) 12/14/2014  . Acute abdominal pain   . Near syncope   . Hypotension 11/30/2014  . AKI (acute kidney injury) ([place])   . Abnormal LFTs 10/26/2012  . Hyperthyroidism 05/26/2011  . 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 ICD-[employer] 07/22/2010  . Nonischemic cardiomyopathy ([place]) 07/22/2010  . Essential hypertension, benign 11/21/2008  . SYSTOLIC HEART FAILURE, CHRONIC 10/23/2008    Past Surgical History:  Procedure Laterality Date  . APPLICATION OF A-CELL OF EXTREMITY N/A 09/05/2015   Procedure: APPLICATION OF A-CELL OF sternum;  Surgeon:  Loel Lofty Dillingham, DO;  Location: [facility];  Service: Plastics;  Laterality: N/A;  . APPLICATION OF WOUND VAC N/A 08/27/2015   Procedure: APPLICATION OF WOUND VAC;  Surgeon: Ivin Poot, MD;  Location: [facility];  Service: Thoracic;  Laterality: N/A;  . APPLICATION OF WOUND VAC N/A 08/29/2015   Procedure: WOUND VAC CHANGE;  Surgeon: Ivin Poot, MD;  Location: [facility];  Service: Thoracic;  Laterality: N/A;  . APPLICATION OF WOUND VAC N/A 09/05/2015   Procedure: APPLICATION OF WOUND VAC to sternum;  Surgeon:  Loel Lofty Dillingham, DO;  Location: [facility];  Service: Plastics;  Laterality: N/A;  . CARDIAC CATHETERIZATION  06/2006   RIGHT HEART CATH SHOWING SEVERE BIVENTRICUALR CHF WITH MARKED FILLING AND PRESSURES  . CARDIAC CATHETERIZATION N/A 12/14/2014   Procedure: Right Heart Cath;  Surgeon: Jolaine Artist, MD;  Location: [facility];  Service: Cardiovascular;  Laterality: N/A;  . CARDIAC CATHETERIZATION N/A 12/14/2014   Procedure: IABP Insertion;  Surgeon: Jolaine Artist, MD;  Location: [facility];  Service: Cardiovascular;  Laterality: N/A;  . CARDIAC CATHETERIZATION N/A 12/18/2015   Procedure: Right Heart Cath;  Surgeon: Jolaine Artist, MD;  Location: [facility];  Service: Cardiovascular;  Laterality: N/A;  . CHOLECYSTECTOMY    . ESOPHAGOGASTRODUODENOSCOPY N/A 09/17/2015   Procedure: ESOPHAGOGASTRODUODENOSCOPY (EGD);  Surgeon: Carol Ada, MD;  Location: [facility];  Service: Gastroenterology;  Laterality: N/A;  . HEART TRANSPLANT    . IMPLANTABLE CARDIOVERTER DEFIBRILLATOR GENERATOR CHANGE N/A 07/01/2011   Procedure: IMPLANTABLE CARDIOVERTER DEFIBRILLATOR GENERATOR CHANGE;  Surgeon: Deboraha Sprang, MD;  Location: [facility];  Service: Cardiovascular;  Laterality: N/A;  . INCISION AND DRAINAGE OF WOUND N/A 09/05/2015   Procedure: IRRIGATION AND DEBRIDEMENT sternal WOUND;  Surgeon: Loel Lofty Dillingham, DO;  Location: [facility];  Service: Plastics;  Laterality: N/A;  . INSERT / REPLACE / REMOVE PACEMAKER     GUIDANT HE ICD MODEL 2180, SERIAL # [id number]  . INSERTION OF DIALYSIS CATHETER Right 09/05/2015   Procedure: INSERTION OF DIALYSIS CATHETER;RIGHT SUBCLAVIAN;  Surgeon: Loel Lofty Dillingham, DO;  Location: [facility];  Service: Plastics;  Laterality: Right;  . INSERTION OF IMPLANTABLE LEFT VENTRICULAR ASSIST DEVICE N/A 12/20/2014   Procedure: INSERTION OF IMPLANTABLE LEFT VENTRICULAR ASSIST DEVICE;  Surgeon: Ivin Poot, MD;  Location: [facility];  Service: Open Heart Surgery;   Laterality: N/A;  CIRC ARREST  NITRIC OXIDE  . PECTORALIS FLAP N/A 09/09/2015   Procedure: DEBRIDEMENT AND CLOSURE WOUND WITH PLACEMENT OF ABRA CLOSURE DEVICE;  Surgeon: Loel Lofty Dillingham, DO;  Location: [facility];  Service: Plastics;  Laterality: N/A;  . STERNAL CLOSURE N/A 09/17/2015   Procedure: ABDOMINAL WOUND CLOSURE WITH INCISIONAL VAC APPLICATION;  Surgeon: Ivin Poot, MD;  Location: [facility];  Service: Thoracic;  Laterality: N/A;  . STERNAL WOUND DEBRIDEMENT N/A 08/27/2015   Procedure: WOUND IRRIGATION AND DEBRIDEMENT;  Surgeon: Ivin Poot, MD;  Location: [facility];  Service: Thoracic;  Laterality: N/A;  . STERNAL WOUND DEBRIDEMENT N/A 08/29/2015   Procedure: DEBRIDEMENT OF chest wound;  Surgeon: Ivin Poot, MD;  Location: [facility];  Service: Thoracic;  Laterality: N/A;  . TEE WITHOUT CARDIOVERSION N/A 12/12/2014   Procedure: TRANSESOPHAGEAL ECHOCARDIOGRAM (TEE);  Surgeon: Jerline Pain, MD;  Location: [facility];  Service: Cardiovascular;  Laterality: N/A;  . TEE WITHOUT CARDIOVERSION N/A 12/20/2014   Procedure: TRANSESOPHAGEAL ECHOCARDIOGRAM (TEE);  Surgeon: Ivin Poot, MD;  Location: [facility];  Service:  Open Heart Surgery;  Laterality: N/A;        Home Medications    Prior to Admission medications   Medication Sig Start Date End Date Taking? Authorizing Provider  amoxicillin (AMOXIL) 500 MG capsule Take 4 capsules (2,000 mg total) by mouth as directed. 1 hour prior to dental appointment 07/28/16   Larey Dresser, MD  aspirin EC 81 MG tablet Take 81 mg by mouth daily.    [provider]  atovaquone (MEPRON) 750 MG/5ML suspension Take 1,500 mg by mouth daily.    [provider]  calcium-vitamin D (OSCAL WITH D) 500-200 MG-UNIT tablet Take 1 tablet by mouth 2 (two) times daily.    [provider]  citalopram (CELEXA) 20 MG tablet Take 1 tablet (20 mg total) by mouth daily. 12/09/16   [name], Shaune Pascal, MD  citalopram (CELEXA) 20 MG tablet Take 20 mg by  mouth daily.    [provider]  clotrimazole (MYCELEX) 10 MG troche Take 10 mg by mouth 2 (two) times daily.    [provider]  furosemide (LASIX) 40 MG tablet Take 1 tablet (40 mg total) by mouth daily as needed (weight gain or swelling). Patient taking differently: Take 40 mg by mouth 2 (two) times daily.  07/24/16   [name], Shaune Pascal, MD  ganciclovir in sodium chloride 0.9 % 100 mL Inject 176.5 mg into the vein 2 (two) times daily. With sodium chloride 0.9% SolP 100 mL    [provider]  isosorbide mononitrate (IMDUR) 30 MG 24 hr tablet Take 30 mg by mouth daily.    [provider]  magnesium oxide (MAG-OX) 400 (241.3 Mg) MG tablet Take 800 mg by mouth 2 (two) times daily.    [provider]  predniSONE (DELTASONE) 10 MG tablet Take 10 mg by mouth daily with breakfast.    [provider]  sodium bicarbonate 650 MG tablet Take 650 mg by mouth 2 (two) times daily.    [provider]  sodium chloride 0.9 % Inject 100 mLs into the vein 2 (two) times daily.    [provider]  tacrolimus (PROGRAF) 1 MG capsule Take 2 mg by mouth 2 (two) times daily.    [provider]    Family History Family History  Problem Relation Age of Onset  . Heart failure Brother     Social History Social History   Tobacco Use  . Smoking status: Never Smoker  . Smokeless tobacco: Never Used  Substance Use Topics  . Alcohol use: No  . Drug use: No     Allergies   Phytonadione   Review of Systems Review of Systems  Gastrointestinal: Positive for abdominal pain.  All other systems reviewed and are negative.    Physical Exam Updated Vital Signs BP (!) 136/101 (BP Location: Left Arm)   Pulse (!) 101   Temp 98.4 F (36.9 C) (Oral)   Resp 17   SpO2 96%   Physical Exam  Constitutional: He is oriented to person, place, and time. He appears well-developed and well-nourished. He appears ill. He appears distressed.    HENT:  Head: Normocephalic and atraumatic.  Cardiovascular: Regular rhythm.  No murmur heard. tachycardic  Pulmonary/Chest: Effort normal. No respiratory distress.  Fine crackles in bilateral bases  Abdominal:  Distended abdomen, severe generalized tenderness, voluntary guarding  Musculoskeletal: He exhibits no edema or tenderness.  Neurological: He is alert and oriented to person, place, and time.  Skin: Skin is warm and dry.  Psychiatric: He has a normal mood and affect. His behavior is normal.  Nursing note and vitals reviewed.    ED Treatments / Results  Labs (all labs ordered are listed, but only abnormal results are displayed) Labs Reviewed  COMPREHENSIVE METABOLIC PANEL - Abnormal; Notable for the following components:      Result Value   Glucose, Bld 110 (*)    BUN 65 (*)    Creatinine, Ser 2.56 (*)    Total Bilirubin 1.5 (*)    GFR calc non Af Amer 28 (*)    GFR calc Af Amer 32 (*)    All other components within normal limits  CBC - Abnormal; Notable for the following components:   WBC 15.3 (*)    RBC 4.03 (*)    Hemoglobin 10.2 (*)    HCT 33.4 (*)    MCH 25.3 (*)    RDW 18.4 (*)    All other components within normal limits  CULTURE, BLOOD (ROUTINE X 2)  CULTURE, BLOOD (ROUTINE X 2)  LIPASE, BLOOD  URINALYSIS, ROUTINE W REFLEX MICROSCOPIC  I-STAT TROPONIN, ED  I-STAT CG4 LACTIC ACID, ED    EKG EKG Interpretation  Date/Time:  Wednesday Sep 15 2017 01:52:36 EDT Ventricular Rate:  101 PR Interval:  150 QRS Duration: 134 QT Interval:  424 QTC Calculation: 549 R Axis:   -66 Text Interpretation:  Sinus tachycardia Possible Left atrial enlargement Right bundle branch block Left anterior fascicular block  Bifascicular block  Left ventricular hypertrophy with repolarization abnormality Abnormal ECG No significant change since last tracing Confirmed by Quintella Reichert [phone]) on 09/15/2017 7:52:17 AM   Radiology Dg Chest 2 View  Result Date:  09/15/2017 CLINICAL DATA:  Acute onset of generalized abdominal pain. EXAM: CHEST - 2 VIEW COMPARISON:  Chest radiograph performed 09/01/2017 FINDINGS: The lungs are mildly hypoexpanded. Mild bibasilar airspace opacities may reflect pneumonia. Mild vascular congestion is noted. There is no evidence of pleural effusion or pneumothorax. The heart is borderline normal in size. The patient is status post median sternotomy. No acute osseous abnormalities are seen. Metallic leads are noted along the left side of the chest. A left PICC is noted ending about the mid SVC. IMPRESSION: Lungs mildly hypoexpanded. Mild bibasilar airspace opacities may reflect pneumonia. Mild vascular congestion noted. Electronically Signed   By: Garald Balding M.D.   On: 09/15/2017 02:33    Procedures Procedures (including critical care time)  Medications Ordered in ED Medications  HYDROmorphone (DILAUDID) injection 0.5 mg (has no administration in time range)  fentaNYL (SUBLIMAZE) injection 50 mcg (50 mcg Nasal Given 09/15/17 0304)     Initial Impression / Assessment and Plan / ED Course  I have reviewed the triage vital signs and the nursing notes.  Pertinent labs & imaging results that were available during my care of the patient were reviewed by me and considered in my medical decision making (see chart for details).    Patient s/p cardiac transplant here with severe abdominal pain, recent admission for CMV colitis on picc line gancyclovir.  Pt with severe tenderness on exam, plan to obtain CT to further evaluate.  Patient care transferred pending further studies.    CXR with bibasilar opacities - current presentation not c/w pna.   Final Clinical Impressions(s) / ED Diagnoses   Final diagnoses:  None    ED Discharge Orders    None       Quintella Reichert, MD 09/15/17 [id number]    Quintella Reichert, MD 09/15/17 [phone]

## 2017-09-15 NOTE — Anesthesia Preprocedure Evaluation (Addendum)
Anesthesia Evaluation  Patient identified by MRN, date of birth, ID band Patient awake    Reviewed: Allergy & Precautions, NPO status , Patient's Chart, lab work & pertinent test results, reviewed documented beta blocker date and time   History of Anesthesia Complications Negative for: history of anesthetic complications  Airway Mallampati: II  TM Distance: >3 FB Neck ROM: Full    Dental  (+) Dental Advisory Given   Pulmonary neg pulmonary ROS,    breath sounds clear to auscultation       Cardiovascular hypertension, Pt. on medications and Pt. on home beta blockers +CHF  + Cardiac Defibrillator  Rhythm:Regular Rate:Tachycardia  Hx/o Non ischemic cardiomyopathy Hx/o sudden death - successfully resuscitated EKG 09/15/2017- Sinus tachycardia, RBBB + LAFB Hx/o LVAD placement in the past [facility] 04/2017   Neuro/Psych PSYCHIATRIC DISORDERS Depression negative neurological ROS     GI/Hepatic Neg liver ROS, Perforated viscus Hx/o massive GI bleed in 2012- Transfused 12 u PRBC   Endo/Other  Hyperthyroidism Hyperlipidemia  Renal/GU Renal Insufficiency, ARF and CRFRenal disease  negative genitourinary   Musculoskeletal  (+) Arthritis ,   Abdominal (+)  Abdomen: tender.    Peds  Hematology  (+) anemia ,   Anesthesia Other Findings   Reproductive/Obstetrics                         Anesthesia Physical Anesthesia Plan  ASA: IV and emergent  Anesthesia Plan: General   Post-op Pain Management:    Induction: Intravenous, Rapid sequence and Cricoid pressure planned  PONV Risk Score and Plan: 4 or greater and Treatment may vary due to age or medical condition  Airway Management Planned: Oral ETT  Additional Equipment: Arterial line, Ultrasound Guidance Line Placement and CVP  Intra-op Plan:   Post-operative Plan: Post-operative intubation/ventilation  Informed Consent: I  have reviewed the patients History and Physical, chart, labs and discussed the procedure including the risks, benefits and alternatives for the proposed anesthesia with the patient or authorized representative who has indicated his/her understanding and acceptance.   Dental advisory given  Plan Discussed with: CRNA, Anesthesiologist and Surgeon  Anesthesia Plan Comments:      Anesthesia Quick Evaluation

## 2017-09-15 NOTE — Consult Note (Signed)
Reason for Consult:  Free air Referring Physician: Langston Masker PA-C  [name] is an 47 y.o. male.   HPI: Patient was brought to the ED by EMS with the onset of abdominal pain that started yesterday AM and  became progressively worse.  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 He was treated for a GI bleed and CT showed bleeding from the right hepatic flexure with resolving diverticulitis on 08/12/17 with reported embolization of the colon.  I did not find the embolization note.  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 He says he was able to eat some yesterday, no nausea or vomiting.  He had a BM yesterday and he did not note any blood.  Pain got to the point he could not stand it and came to the ED for evaluation around 1:48 this AM.  Nothing made the pain better at home.  He tried to just get thru it, he says it is similar to some diverticulitis he had some years ago only much worse.    Work up in the ED shows he is afebrile, tachycardic, diastolic blood pressure slightly elevated.  CMP shows an elevated creatinine 2.56, total bilirubin 1.5.  WBC is 15.3, hemoglobin 10.2, hematocrit of 33.4, platelets 202,000.  Stool is positive for fecal occult.  CT scan shows colonic diverticuli throughout the colon no appendicitis no visible pneumatosis or bowel wall thickening.  There is notable focus of mesenteric inflammation fluid and gas around the middle colic embolization.  A moderate pneumoperitoneum small volume of pelvic fluid without loculation.  Consistent with bowel perforation pneumoperitoneum.  Potentially localizing pocket of interloop fluid gas and mesenteric inflammation in the right upper quadrant neighboring a recent middle colic branch embolization.:  Wall was not thickened as would be expected with necrosis.  We are asked to see.  Past Medical History:  Diagnosis Date  . AICD (automatic  cardioverter/defibrillator) present   . AKI (acute kidney injury) ([place])   . Atrial fibrillation -parosysmal     Rx w amiodarone  . Automatic implantable cardiac defibrillator -BSX    single chamber  . CHF (congestive heart failure) ([place])   . Chronic systolic heart failure (HCC)    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%)  . Elevated LFTs   . GI bleed -massive    11 Units 2012  . Gout   . H/O hyperthyroidism   . Hypertension   . Noncompliance    H/O  MEDICAL NONCOMPLIANCE  . 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 Polymorphic ventricular tachycardia (HCC)    RECURRENT WITH APPROPRIATE SHOCK THERAPY IN THE PAST  . RA (rheumatoid arthritis) ([place])   . Severe mitral regurgitation   . Tricuspid valve regurgitation    SEVERE  . Ventricular fibrillation ([place])    WITH APPROPRIATE SHOCK THERAPY IN THE PAST    Past Surgical History:  Procedure Laterality Date  . APPLICATION OF A-CELL OF EXTREMITY N/A 09/05/2015   Procedure: APPLICATION OF A-CELL OF sternum;  Surgeon: Loel Lofty Dillingham, DO;  Location: [facility];  Service: Plastics;  Laterality: N/A;  . APPLICATION OF WOUND VAC N/A 08/27/2015   Procedure: APPLICATION OF WOUND VAC;  Surgeon: Ivin Poot, MD;  Location: [facility] OR;  Service: Thoracic;  Laterality: N/A;  . APPLICATION OF WOUND VAC N/A 08/29/2015   Procedure: WOUND VAC CHANGE;  Surgeon: Tharon Aquas  Kerby Less, MD;  Location: [facility] OR;  Service: Thoracic;  Laterality: N/A;  . APPLICATION OF WOUND VAC N/A 09/05/2015   Procedure: APPLICATION OF WOUND VAC to sternum;  Surgeon: Loel Lofty Dillingham, DO;  Location: [facility];  Service: Plastics;  Laterality: N/A;  . CARDIAC CATHETERIZATION  06/2006   RIGHT HEART CATH SHOWING SEVERE BIVENTRICUALR CHF WITH MARKED FILLING AND PRESSURES  . CARDIAC CATHETERIZATION N/A 12/14/2014   Procedure: Right Heart Cath;  Surgeon: Jolaine Artist, MD;  Location: [facility] CV LAB;  Service: Cardiovascular;  Laterality:  N/A;  . CARDIAC CATHETERIZATION N/A 12/14/2014   Procedure: IABP Insertion;  Surgeon: Jolaine Artist, MD;  Location: [facility] CV LAB;  Service: Cardiovascular;  Laterality: N/A;  . CARDIAC CATHETERIZATION N/A 12/18/2015   Procedure: Right Heart Cath;  Surgeon: Jolaine Artist, MD;  Location: [facility] CV LAB;  Service: Cardiovascular;  Laterality: N/A;  . CHOLECYSTECTOMY    . ESOPHAGOGASTRODUODENOSCOPY N/A 09/17/2015   Procedure: ESOPHAGOGASTRODUODENOSCOPY (EGD);  Surgeon: Carol Ada, MD;  Location: [facility];  Service: Gastroenterology;  Laterality: N/A;  . HEART TRANSPLANT    . IMPLANTABLE CARDIOVERTER DEFIBRILLATOR GENERATOR CHANGE N/A 07/01/2011   Procedure: IMPLANTABLE CARDIOVERTER DEFIBRILLATOR GENERATOR CHANGE;  Surgeon: Deboraha Sprang, MD;  Location: [facility] CATH LAB;  Service: Cardiovascular;  Laterality: N/A;  . INCISION AND DRAINAGE OF WOUND N/A 09/05/2015   Procedure: IRRIGATION AND DEBRIDEMENT sternal WOUND;  Surgeon: Loel Lofty Dillingham, DO;  Location: [facility];  Service: Plastics;  Laterality: N/A;  . INSERT / REPLACE / REMOVE PACEMAKER     GUIDANT HE ICD MODEL 2180, SERIAL # [id number]  . INSERTION OF DIALYSIS CATHETER Right 09/05/2015   Procedure: INSERTION OF DIALYSIS CATHETER;RIGHT SUBCLAVIAN;  Surgeon: Loel Lofty Dillingham, DO;  Location: [facility];  Service: Plastics;  Laterality: Right;  . INSERTION OF IMPLANTABLE LEFT VENTRICULAR ASSIST DEVICE N/A 12/20/2014   Procedure: INSERTION OF IMPLANTABLE LEFT VENTRICULAR ASSIST DEVICE;  Surgeon: Ivin Poot, MD;  Location: [facility];  Service: Open Heart Surgery;  Laterality: N/A;  CIRC ARREST  NITRIC OXIDE  . PECTORALIS FLAP N/A 09/09/2015   Procedure: DEBRIDEMENT AND CLOSURE WOUND WITH PLACEMENT OF ABRA CLOSURE DEVICE;  Surgeon: Loel Lofty Dillingham, DO;  Location: [facility];  Service: Plastics;  Laterality: N/A;  . STERNAL CLOSURE N/A 09/17/2015   Procedure: ABDOMINAL WOUND CLOSURE WITH INCISIONAL VAC APPLICATION;  Surgeon: Ivin Poot, MD;   Location: [facility];  Service: Thoracic;  Laterality: N/A;  . STERNAL WOUND DEBRIDEMENT N/A 08/27/2015   Procedure: WOUND IRRIGATION AND DEBRIDEMENT;  Surgeon: Ivin Poot, MD;  Location: [facility];  Service: Thoracic;  Laterality: N/A;  . STERNAL WOUND DEBRIDEMENT N/A 08/29/2015   Procedure: DEBRIDEMENT OF chest wound;  Surgeon: Ivin Poot, MD;  Location: [facility];  Service: Thoracic;  Laterality: N/A;  . TEE WITHOUT CARDIOVERSION N/A 12/12/2014   Procedure: TRANSESOPHAGEAL ECHOCARDIOGRAM (TEE);  Surgeon: Jerline Pain, MD;  Location: [facility];  Service: Cardiovascular;  Laterality: N/A;  . TEE WITHOUT CARDIOVERSION N/A 12/20/2014   Procedure: TRANSESOPHAGEAL ECHOCARDIOGRAM (TEE);  Surgeon: Ivin Poot, MD;  Location: [facility];  Service: Open Heart Surgery;  Laterality: N/A;    Family History  Problem Relation Age of Onset  . Heart failure Brother     Social History:  reports that he has never smoked. He has never used smokeless tobacco. He reports that he does not drink alcohol or use drugs.  Allergies:  Allergies  Allergen Reactions  . Phytonadione Other (  See Comments)    Patient has LVAD: please check with LVAD coordinator on call or LVAD MD on call before reversal of anticoagulation with vit k   Prior to Admission medications   Medication Sig Start Date End Date Taking? Authorizing Provider  amoxicillin (AMOXIL) 500 MG capsule Take 4 capsules (2,000 mg total) by mouth as directed. 1 hour prior to dental appointment 07/28/16   Larey Dresser, MD  aspirin EC 81 MG tablet Take 81 mg by mouth daily.    [provider]  atovaquone (MEPRON) 750 MG/5ML suspension Take 1,500 mg by mouth daily.    [provider]  calcium-vitamin D (OSCAL WITH D) 500-200 MG-UNIT tablet Take 1 tablet by mouth 2 (two) times daily.    [provider]  citalopram (CELEXA) 20 MG tablet Take 1 tablet (20 mg total) by mouth daily. 12/09/16   [name], Shaune Pascal, MD  citalopram (CELEXA) 20  MG tablet Take 20 mg by mouth daily.    [provider]  clotrimazole (MYCELEX) 10 MG troche Take 10 mg by mouth 2 (two) times daily.    [provider]  furosemide (LASIX) 40 MG tablet Take 1 tablet (40 mg total) by mouth daily as needed (weight gain or swelling). Patient taking differently: Take 40 mg by mouth 2 (two) times daily.  07/24/16   [name], Shaune Pascal, MD  ganciclovir in sodium chloride 0.9 % 100 mL Inject 176.5 mg into the vein 2 (two) times daily. With sodium chloride 0.9% SolP 100 mL    [provider]  isosorbide mononitrate (IMDUR) 30 MG 24 hr tablet Take 30 mg by mouth daily.    [provider]  magnesium oxide (MAG-OX) 400 (241.3 Mg) MG tablet Take 800 mg by mouth 2 (two) times daily.    [provider]  predniSONE (DELTASONE) 10 MG tablet Take 10 mg by mouth daily with breakfast.    [provider]  sodium bicarbonate 650 MG tablet Take 650 mg by mouth 2 (two) times daily.    [provider]  sodium chloride 0.9 % Inject 100 mLs into the vein 2 (two) times daily.    [provider]  tacrolimus (PROGRAF) 1 MG capsule Take 2 mg by mouth 2 (two) times daily.    [provider]     Results for orders placed or performed during the hospital encounter of 09/15/17 (from the past 48 hour(s))  Urinalysis, Routine w reflex microscopic     Status: Abnormal   Collection Time: 09/15/17  1:51 AM  Result Value Ref Range   Color, Urine YELLOW YELLOW   APPearance CLEAR CLEAR   Specific Gravity, Urine 1.014 1.005 - 1.030   pH 5.0 5.0 - 8.0   Glucose, UA NEGATIVE NEGATIVE mg/dL   Hgb urine dipstick NEGATIVE NEGATIVE   Bilirubin Urine NEGATIVE NEGATIVE   Ketones, ur 5 (A) NEGATIVE mg/dL   Protein, ur 30 (A) NEGATIVE mg/dL   Nitrite NEGATIVE NEGATIVE   Leukocytes, UA NEGATIVE NEGATIVE   RBC / HPF 0-5 0 - 5 RBC/hpf   WBC, UA 0-5 0 - 5 WBC/hpf   Bacteria, UA RARE (A) NONE SEEN   Squamous Epithelial / LPF 0-5  0 - 5   Mucus PRESENT    Hyaline Casts, UA PRESENT     Comment: Performed at [facility], [address], [place], [place] [address]  Lipase, blood     Status: None   Collection Time: 09/15/17  1:54 AM  Result Value Ref Range   Lipase 22 11 - 51 U/L    Comment: Performed at [facility] [address] [place], [place] [address]  Comprehensive metabolic panel     Status: Abnormal   Collection Time: 09/15/17  1:54 AM  Result Value Ref Range   Sodium 143 135 - 145 mmol/L   Potassium 4.5 3.5 - 5.1 mmol/L   Chloride 108 101 - 111 mmol/L   CO2 22 22 - 32 mmol/L   Glucose, Bld 110 (H) 65 - 99 mg/dL   BUN 65 (H) 6 - 20 mg/dL   Creatinine, Ser 2.56 (H) 0.61 - 1.24 mg/dL   Calcium 9.6 8.9 - 10.3 mg/dL   Total Protein 6.7 6.5 - 8.1 g/dL   Albumin 3.6 3.5 - 5.0 g/dL   AST 17 15 - 41 U/L   ALT 20 17 - 63 U/L   Alkaline Phosphatase 111 38 - 126 U/L   Total Bilirubin 1.5 (H) 0.3 - 1.2 mg/dL   GFR calc non Af Amer 28 (L) >60 mL/min   GFR calc Af Amer 32 (L) >60 mL/min    Comment: (NOTE) The eGFR has been calculated using the CKD EPI equation. This calculation has not been validated in all clinical situations. eGFR's persistently <60 mL/min signify possible Chronic Kidney Disease.    Anion gap 13 5 - 15    Comment: Performed at [facility] [address] [place], [place] [address]  CBC     Status: Abnormal   Collection Time: 09/15/17  1:54 AM  Result Value Ref Range   WBC 15.3 (H) 4.0 - 10.5 K/uL   RBC 4.03 (L) 4.22 - 5.81 MIL/uL   Hemoglobin 10.2 (L) 13.0 - 17.0 g/dL   HCT 33.4 (L) 39.0 - 52.0 %   MCV 82.9 78.0 - 100.0 fL   MCH 25.3 (L) 26.0 - 34.0 pg   MCHC 30.5 30.0 - 36.0 g/dL   RDW 18.4 (H) 11.5 - 15.5 %   Platelets 202 150 - 400 K/uL    Comment: Performed at [facility], [place] [address] [place], [place] [address]  I-stat troponin, ED     Status: None   Collection Time: 09/15/17  2:08 AM  Result Value Ref Range   Troponin i, poc 0.08 0.00 - 0.08  ng/mL   Comment 3            Comment: Due to the release kinetics of cTnI, a negative result within the first hours of the onset of symptoms does not rule out myocardial infarction with certainty. If myocardial infarction is still suspected, repeat the test at appropriate intervals.   I-Stat CG4 Lactic Acid, ED     Status: None   Collection Time: 09/15/17  8:32 AM  Result Value Ref Range   Lactic Acid, Venous 1.54 0.5 - 1.9 mmol/L    Ct Abdomen Pelvis Wo Contrast  Result Date: 09/15/2017 CLINICAL DATA:  Abdominal pain and fever.  Abscess suspected. History of cardiac transplant.  Recent CMV colitis. EXAM: CT ABDOMEN AND PELVIS WITHOUT CONTRAST TECHNIQUE: Multidetector CT imaging of the abdomen and pelvis was performed following the standard protocol without IV contrast. COMPARISON:  09/11/2015 FINDINGS: Lower chest: Large appearance of the left left atrium. History of cardiac transplantation. Small right pleural effusion with mild atelectatic opacity at the bases. Gynecomastia. Hepatobiliary: No focal liver abnormality.Cholecystectomy. Normal common bile duct diameter. Pancreas: Unremarkable. Spleen: Unremarkable. Adrenals/Urinary Tract: Negative adrenals. No hydronephrosis or  stone. Mild symmetric renal atrophy. Unremarkable bladder. Stomach/Bowel: Colonic diverticula throughout the colon. No appendicitis. No visible pneumatosis or bowel wall thickening. There is a notable focus of mesenteric inflammation, fluid, and gas around a middle colic embolization. Vascular/Lymphatic: No acute vascular abnormality. No mass or adenopathy. Reproductive:No pathologic findings. Other: Moderate pneumoperitoneum. Small volume pelvic fluid without loculation. Musculoskeletal: No acute abnormalities. Critical Value/emergent results were called by telephone at the time of interpretation on 09/15/2017 at 9:36 am to Dr. Quintella Reichert , who verbally acknowledged these results. IMPRESSION: Bowel perforation and  pneumoperitoneum. Potentially localizing pocket of interloop fluid, gas, and mesenteric inflammation in the right upper quadrant neighboring a recent middle colic branch embolization, but the colonic wall is not thickened as would be expected for necrosis. Colonic diverticula throughout the colon with none appearing inflamed. No gas accumulation or inflammatory change around the stomach and duodenum. Electronically Signed   By: Monte Fantasia M.D.   On: 09/15/2017 09:40   Dg Chest 2 View  Result Date: 09/15/2017 CLINICAL DATA:  Acute onset of generalized abdominal pain. EXAM: CHEST - 2 VIEW COMPARISON:  Chest radiograph performed 09/01/2017 FINDINGS: The lungs are mildly hypoexpanded. Mild bibasilar airspace opacities may reflect pneumonia. Mild vascular congestion is noted. There is no evidence of pleural effusion or pneumothorax. The heart is borderline normal in size. The patient is status post median sternotomy. No acute osseous abnormalities are seen. Metallic leads are noted along the left side of the chest. A left PICC is noted ending about the mid SVC. IMPRESSION: Lungs mildly hypoexpanded. Mild bibasilar airspace opacities may reflect pneumonia. Mild vascular congestion noted. Electronically Signed   By: Garald Balding M.D.   On: 09/15/2017 02:33    Review of Systems  Constitutional: Negative for chills, diaphoresis, fever, malaise/fatigue and weight loss.  HENT: Negative.   Eyes: Negative.   Respiratory: Positive for shortness of breath (DOE is ongoing with exercise). Negative for cough, hemoptysis, sputum production and wheezing.   Cardiovascular: Negative.   Gastrointestinal: Positive for abdominal pain (started yesterday AM and became progressively worse.). Negative for blood in stool, constipation, diarrhea, heartburn, melena, nausea and vomiting.       Last BM yesterday  Genitourinary: Negative.   Musculoskeletal: Negative.   Skin: Negative.   Neurological: Negative.    Endo/Heme/Allergies: Negative.   Psychiatric/Behavioral: Positive for depression. The patient is nervous/anxious.    Blood pressure 119/88, pulse 99, temperature 98.4 F (36.9 C), temperature source Oral, resp. rate (!) 23, weight 74.4 kg (164 lb), SpO2 91 %. Physical Exam  Constitutional: He is oriented to person, place, and time. He appears distressed.  Acutely ill with tachycardia and extreme abdominal pain.  HENT:  Head: Normocephalic and atraumatic.  Mouth/Throat: No oropharyngeal exudate.  Eyes: Right eye exhibits no discharge. Left eye exhibits no discharge. No scleral icterus.  Pupils are equal  Neck: Normal range of motion. Neck supple. JVD (minimal) present. No tracheal deviation present. Thyromegaly present.  Cardiovascular: Normal rate, normal heart sounds and intact distal pulses.  No murmur heard. Tachycardic 105  Respiratory: Effort normal and breath sounds normal. No respiratory distress. He has no wheezes. He has no rales. He exhibits no tenderness.  Mediastinotomy incision healed, old site for his AICD/pacer healed.  GI: He exhibits distension. There is tenderness. There is rebound and guarding.  Scars from LVAD, prior cholecystectomy  Musculoskeletal: He exhibits no edema or tenderness.  Lymphadenopathy:    He has no cervical adenopathy.  Neurological: He is alert and oriented  to person, place, and time. No cranial nerve deficit.  Skin: Skin is warm and dry. No rash noted. No erythema. No pallor.  Psychiatric: He has a normal mood and affect. His behavior is normal. Judgment and thought content normal.    Assessment/Plan: Pneumoperitoneum Cardiac Transplant January, 2, 2019 - [facility] GI bleed with middle colon embolization 07/2017 - [facility] CMV colitis - on ganciclovir  Acute on chronic kidney disease- creatinine 2.56 Hypertension Rheumatoid arthritis Hx of VF - prior shock therapy  Plan: He has been seen and evaluated by Dr. Ninfa Linden and he needs to go to the OR  emergently.  We will plan to take him to the OR and CCM will manage post op.  We are also going to get him transferred to [facility] after surgery.    Kyonna Frier 09/15/2017, 9:52 AM

## 2017-09-15 NOTE — Anesthesia Procedure Notes (Signed)
Central Venous Catheter Insertion Performed by: Oleta Mouse, MD, anesthesiologist Start/End5/29/2019 11:56 AM, 09/15/2017 12:01 PM Patient location: OR. Preanesthetic checklist: patient identified, IV checked, site marked, risks and benefits discussed, surgical consent, monitors and equipment checked, pre-op evaluation, timeout performed and anesthesia consent Hand hygiene performed  and maximum sterile barriers used  Catheter size: 8 Fr Total catheter length 16. Central line was placed.Double lumen Procedure performed using ultrasound guided technique. Ultrasound Notes:anatomy identified, needle tip was noted to be adjacent to the nerve/plexus identified, no ultrasound evidence of intravascular and/or intraneural injection and image(s) printed for medical record Attempts: 1 Following insertion, dressing applied, line sutured and Biopatch. Post procedure assessment: blood return through all ports, free fluid flow and no air  Patient tolerated the procedure well with no immediate complications.

## 2017-09-15 NOTE — ED Notes (Signed)
Wasted 1 mg of Dilaudid w/ [name], RN in [place]; gave 2 doses of 0.5 mg of dilaudid to pt; pt's name would not show in pixis

## 2017-09-15 NOTE — ED Provider Notes (Signed)
Medical screening examination/treatment/procedure(s) were conducted as a shared visit with non-physician practitioner(s) and myself.  I personally evaluated the patient during the encounter.  Patient with complicated medical history to include heart transplant in January, recent c diff colitis and recent GI embolization for Bleed who presents with abdominal pain. Found to have peritonitis on exam. Ct with free air of unclear etiology. Initially discussed with surgery who states he needs emergent surgery. Discussed with hospitalist who recommends ICU consultation. PA discussed with ICU who recommends transfer to [facility] for surgery. PA discussed with surgery again who stated he needed surgery now, then transfer to [facility]. ICU recontacted.  Patient to OR.    CRITICAL CARE Performed by: Merrily Pew Total critical care time: 35 minutes Critical care time was exclusive of separately billable procedures and treating other patients. Critical care was necessary to treat or prevent imminent or life-threatening deterioration. Critical care was time spent personally by me on the following activities: development of treatment plan with patient and/or surrogate as well as nursing, discussions with consultants, evaluation of patient's response to treatment, examination of patient, obtaining history from patient or surrogate, ordering and performing treatments and interventions, ordering and review of laboratory studies, ordering and review of radiographic studies, pulse oximetry and re-evaluation of patient's condition.   EKG Interpretation  Date/Time:  Wednesday Sep 15 2017 01:52:36 EDT Ventricular Rate:  101 PR Interval:  150 QRS Duration: 134 QT Interval:  424 QTC Calculation: 549 R Axis:   -66 Text Interpretation:  Sinus tachycardia Possible Left atrial enlargement Right bundle branch block Left anterior fascicular block  Bifascicular block  Left ventricular hypertrophy with repolarization abnormality  Abnormal ECG No significant change since last tracing Confirmed by Quintella Reichert [phone]) on 09/15/2017 7:52:17 AM Also confirmed by Quintella Reichert [phone]), editor Philomena Doheny [phone])  on 09/15/2017 9:02:12 AM     Denis Koppel, Corene Cornea, MD 09/16/17 [phone]

## 2017-09-15 NOTE — Anesthesia Procedure Notes (Signed)
Arterial Line Insertion Start/End5/29/2019 11:45 AM, 09/15/2017 11:50 AM Performed by: Oleta Mouse, MD, Verdie Drown, CRNA, CRNA  Preanesthetic checklist: patient identified, IV checked, surgical consent, monitors and equipment checked and pre-op evaluation Lidocaine 1% used for infiltration and patient sedated Left, radial was placed Catheter size: 20 G Hand hygiene performed  and maximum sterile barriers used   Attempts: 1 Procedure performed without using ultrasound guided technique. Ultrasound Notes:anatomy identified Following insertion, dressing applied and Biopatch.

## 2017-09-15 NOTE — Progress Notes (Signed)
Report given to Suzan Garibaldi, RN, of [place]. Report also given at bedside to [facility] team.

## 2017-09-15 NOTE — ED Notes (Signed)
Wasted 1 mg dilaudid with RN Gery Pray Kretschmar in [place].  RN gave 2 doses of 0.5 mg.  Name did not show in pixis when attempting to waste at end of shift.

## 2017-09-15 NOTE — Progress Notes (Signed)
Patient ID: [name], male   DOB: Oct 13, 1970, 47 y.o.   MRN: [id number]   This gentleman is status post recent heart transplant at [facility] and then more recently embolization of the right colon for a GI bleed.  He now presents with abdominal pain.  He has frank peritonitis and a rigid abdomen on examination.  He has a CT scan showing free air or free fluid.  I have discussed this with the patient and I believe he needs an emergent exploratory laparotomy here at [facility] and then transfer postoperatively to [facility].  I discussed this with the patient.  I discussed the risk of surgery which includes but is not limited to bleeding, infection, injury to surrounding structures, the need for a bowel resection, the need for an ostomy, cardiopulmonary issues, transfer postoperatively, the need for prolonged intubation, and even death.  He understands and agrees to proceed emergently to the operating room

## 2017-09-15 NOTE — Progress Notes (Signed)
Patient received  From OR from [place]. Patient is receiving 75 mcg/kg/min of Propofol, and 100 mL/hr of LR.

## 2017-09-15 NOTE — ED Notes (Signed)
MD paged regarding PRN pain meds

## 2017-09-15 NOTE — ED Notes (Addendum)
Spoke with EDP about patient advised patient morning, tachycardiac recent transplant in jan 2019. Charge aware room needed for patient

## 2017-09-15 NOTE — ED Triage Notes (Signed)
Per EMS pt from home. Onset of abdominal pain today that has become progressively worse.  Hx of diverticulitis and also heart transplant in January.  No n/v/d.

## 2017-09-15 NOTE — Transfer of Care (Signed)
Immediate Anesthesia Transfer of Care Note  Patient: [name]  Procedure(s) Performed: EXPLORATORY LAPAROTOMY; RIGHT HEMICOLECTOMY (N/A Abdomen) ILEOSTOMY (N/A Abdomen)  Patient Location: ICU  Anesthesia Type:General  Level of Consciousness: Patient remains intubated per anesthesia plan  Airway & Oxygen Therapy: Patient remains intubated per anesthesia plan and Patient placed on Ventilator (see vital sign flow sheet for setting)  Post-op Assessment: Report given to RN and Post -op Vital signs reviewed and stable  Post vital signs: Reviewed and stable  Last Vitals:  Vitals Value Taken Time  BP    Temp    Pulse    Resp    SpO2      Last Pain:  Vitals:   09/15/17 0958  TempSrc: Rectal  PainSc:          Complications: No apparent anesthesia complications

## 2017-09-15 NOTE — Discharge Instructions (Signed)
[facility], [place] [phone]  OPEN ABDOMINAL SURGERY: POST OP INSTRUCTIONS Patient being transferred to [facility] for ongoing treatment and they will give you instructions on discharge from that facility.   Always review your discharge instruction sheet given to you by the facility where your surgery was performed.  IF YOU HAVE DISABILITY OR FAMILY LEAVE FORMS, YOU MUST BRING THEM TO THE OFFICE FOR PROCESSING.  PLEASE DO NOT GIVE THEM TO YOUR DOCTOR.  1. A prescription for pain medication may be given to you upon discharge.  Take your pain medication as prescribed, if needed.  If narcotic pain medicine is not needed, then you may take acetaminophen (Tylenol) or ibuprofen (Advil) as needed. 2. Take your usually prescribed medications unless otherwise directed. 3. If you need a refill on your pain medication, please contact your pharmacy. They will contact our office to request authorization.  Prescriptions will not be filled after 5pm or on week-ends. 4. You should follow a light diet the first few days after arrival home, such as soup and crackers, pudding, etc.unless your doctor has advised otherwise. A high-fiber, low fat diet can be resumed as tolerated.   Be sure to include lots of fluids daily. Most patients will experience some swelling and bruising on the chest and neck area.  Ice packs will help.  Swelling and bruising can take several days to resolve 5. Most patients will experience some swelling and bruising in the area of the incision. Ice pack will help. Swelling and bruising can take several days to resolve..  6. It is common to experience some constipation if taking pain medication after surgery.  Increasing fluid intake and taking a stool softener will usually help or prevent this problem from occurring.  A mild laxative (Milk of Magnesia or Miralax) should be taken according to package directions if there are no bowel movements after 48 hours. 7.   You may have steri-strips (small skin tapes) in place directly over the incision.  These strips should be left on the skin for 7-10 days.  If your surgeon used skin glue on the incision, you may shower in 24 hours.  The glue will flake off over the next 2-3 weeks.  Any sutures or staples will be removed at the office during your follow-up visit. You may find that a light gauze bandage over your incision may keep your staples from being rubbed or pulled. You may shower and replace the bandage daily. 8. ACTIVITIES:  You may resume regular (light) daily activities beginning the next day--such as daily self-care, walking, climbing stairs--gradually increasing activities as tolerated.  You may have sexual intercourse when it is comfortable.  Refrain from any heavy lifting or straining until approved by your doctor. a. You may drive when you no longer are taking prescription pain medication, you can comfortably wear a seatbelt, and you can safely maneuver your car and apply brakes b. Return to Work: ___________________________________ 12. You should see your doctor in the office for a follow-up appointment approximately two weeks after your surgery.  Make sure that you call for this appointment within a day or two after you arrive home to insure a convenient appointment time. OTHER INSTRUCTIONS:  _____________________________________________________________ _____________________________________________________________  WHEN TO CALL YOUR DOCTOR: 1. Fever over 101.0 2. Inability to urinate 3. Nausea and/or vomiting 4. Extreme swelling or bruising 5. Continued bleeding from incision. 6. Increased pain, redness, or drainage from the incision. 7. Difficulty swallowing or breathing 8.  Muscle cramping or spasms. 9. Numbness or tingling in hands or feet or around lips.  The clinic staff is available to answer your questions during regular business hours.  Please dont hesitate to call and ask to speak to one of  the nurses if you have concerns.  For further questions, please visit [email or web address]

## 2017-09-15 NOTE — Consult Note (Signed)
PULMONARY / CRITICAL CARE MEDICINE   Name: [name] MRN: [id number] DOB: 04-12-1971    ADMISSION DATE:  09/15/2017 DATE:  09/15/17  REFERRING MD:  Dr. Ninfa Linden / [facility]   CHIEF COMPLAINT:  Abdominal Pain   HISTORY OF PRESENT ILLNESS:   47 y/o M who presented to [facility] on 5/29 via EMS with reports of abdominal pain.    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 Most recent LVEF 09/09/17 30% (down from 40% 5/04/26/17).  His primary regimen post transplant includes tacrolimus, atovaquone, prednisone 10 mg QD. Mycophenolate recently held by transplant service due to CMV infection and leukopenia (plan was to restart around 6/6). He an episode of acute bloody diarrhea in 07/2017 and underwent embolization to the right colon.  He underwent bedside EGD 4/26 with non-bleeding erosions in the gastric antrum s/p embolization of the right hepatic flexure.  He was treated with cefepime for diverticulosis and later transitioned to Flagyl.  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    Per chart review, the patient presented with acute onset abdominal pain 5/29 that was progressive in nature.  CT of the abdomen revealed a bowel perforation and pneumoperitoneum.  Concern for potential localizing pocket of interloop fluid, gas and mesenteric inflammation in the RUQ neighboring a recent middle colic branch embolization, colonic diverticula throughout the colon with none appearing inflamed. No gas accumulation or inflammatory change around the stomach & duodenum. He was taken emergently to the OR for exploratory laparotomy.  EBL in OR ~ 200 ml.  He was found to have an "area of perforation of the distal right colon just past the hepatic flexure, moderate fibrinous exudate and fluid int  he right lower abdomen & pelvis.  The colon felt boggy".  Right colectomy and ileostomy were performed.  No other intra-abdominal pathology identified.   He was returned to ICU on mechanical ventilation.  STAT labs & CXR pending.    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  PAST SURGICAL HISTORY: He  has a past surgical history that includes Cardiac catheterization 29-Jul-2006); Insert / replace / remove pacemaker; Cholecystectomy; implantable cardioverter defibrillator generator change (N/A, 07/01/2011); TEE without cardioversion (N/A, 12/12/2014); Cardiac catheterization (N/A, 12/14/2014); Cardiac catheterization (N/A, 12/14/2014); Insertion of implantable left ventricular assist device (N/A, 12/20/2014); TEE without cardioversion (N/A, 12/20/2014); Sternal wound debridement (N/A, 08/27/2015); Application if wound vac (N/A, 08/27/2015); Sternal wound debridement (N/A, 08/29/2015); Application if wound vac (N/A, 08/29/2015); Incision and drainage of wound (N/A, 09/05/2015); Application of a-cell of extremity (N/A, 09/05/2015); Application if wound vac (N/A, 09/05/2015); Insertion of dialysis catheter (Right, 09/05/2015); Sternal closure (N/A, 09/17/2015); Esophagogastroduodenoscopy (N/A, 09/17/2015); Pectoralis flap (N/A, 09/09/2015); Cardiac catheterization (N/A, 12/18/2015); and Heart transplant.  Allergies  Allergen Reactions  .  Phytonadione Other (See Comments)    Patient has LVAD: please check with LVAD coordinator on call or LVAD MD on call before  reversal of anticoagulation with vit k    No current facility-administered medications on file prior to encounter.    Current Outpatient Medications on File Prior to Encounter  Medication Sig  . amoxicillin (AMOXIL) 500 MG capsule Take 4 capsules (2,000 mg total) by mouth as directed. 1 hour prior to dental appointment  . atovaquone (MEPRON) 750 MG/5ML suspension Take 1,500 mg by mouth daily.  . calcium-vitamin D (OSCAL WITH D) 500-200 MG-UNIT tablet Take 1 tablet by mouth 2 (two) times daily.  . carvedilol (COREG) 3.125 MG tablet Take 3.125 mg by mouth 2 (two) times daily with a meal.  . citalopram (CELEXA) 20 MG tablet Take 1 tablet (20 mg total) by mouth daily.  . clotrimazole (MYCELEX) 10 MG troche Take 10 mg by mouth 2 (two) times daily.  . furosemide (LASIX) 40 MG tablet Take 1 tablet (40 mg total) by mouth daily as needed (weight gain or swelling). (Patient taking differently: Take 40 mg by mouth daily. )  . ganciclovir in sodium chloride 0.9 % 100 mL Inject 176.5 mg into the vein 2 (two) times daily. With sodium chloride 0.9% SolP 100 mL  . magnesium oxide (MAG-OX) 400 (241.3 Mg) MG tablet Take 800 mg by mouth 2 (two) times daily.  . pantoprazole (PROTONIX) 40 MG tablet Take 40 mg by mouth daily.  . pravastatin (PRAVACHOL) 40 MG tablet Take 40 mg by mouth daily.  . predniSONE (DELTASONE) 10 MG tablet Take 10 mg by mouth daily with breakfast.  . sodium chloride 0.9 % Inject 100 mLs into the vein 2 (two) times daily.  . tacrolimus (PROGRAF) 1 MG capsule Take 2-3 mg by mouth See admin instructions. 3mg  every morning and 2mg  every night    FAMILY HISTORY:  His indicated that his mother is alive. He indicated that his father is deceased. He indicated that his brother is deceased.   SOCIAL HISTORY: He  reports that he has never smoked. He has never used smokeless  tobacco. He reports that he does not drink alcohol or use drugs.  REVIEW OF SYSTEMS:   Unable as patient is intubated and mechanically ventilated   SUBJECTIVE:    VITAL SIGNS: BP (!) 120/95   Pulse 83   Temp 98.9 F (37.2 C) (Rectal)   Resp 12   Wt 164 lb (74.4 kg)   SpO2 100%   BMI 25.69 kg/m   HEMODYNAMICS:    VENTILATOR SETTINGS:    INTAKE / OUTPUT: No intake/output data recorded.  PHYSICAL EXAMINATION: General:  Young adult male, ill appearing on vent but no acute distress Neuro:  Sedate, pupils 2-3mm =/R HEENT:  ETT, R IJ dual lumen catheter, MM pink/moist  Cardiovascular:  s1s2 rrr, no m/r/g Lungs:  Even/non-labored, lungs clear bilaterally  Abdomen:  Soft, midline abd dressing c/d/i, RLQ ileostomy with pink stoma Musculoskeletal:  No acute deformities  Skin:  Warm/dry, no edema   LABS:  BMET Recent Labs  Lab 09/15/17 0154  NA 143  K 4.5  CL 108  CO2 22  BUN 65*  CREATININE 2.56*  GLUCOSE 110*    Electrolytes Recent Labs  Lab 09/15/17 0154  CALCIUM 9.6    CBC Recent Labs  Lab 09/15/17 0154  WBC 15.3*  HGB 10.2*  HCT 33.4*  PLT 202    Coag's No results for input(s): APTT, INR in the last 168 hours.  Sepsis Markers Recent Labs  Lab 09/15/17 0832 09/15/17 1027  LATICACIDVEN 1.54 0.84    ABG No results for  input(s): PHART, PCO2ART, PO2ART in the last 168 hours.  Liver Enzymes Recent Labs  Lab 09/15/17 0154  AST 17  ALT 20  ALKPHOS 111  BILITOT 1.5*  ALBUMIN 3.6    Cardiac Enzymes No results for input(s): TROPONINI, PROBNP in the last 168 hours.  Glucose No results for input(s): GLUCAP in the last 168 hours.  Imaging Ct Abdomen Pelvis Wo Contrast  Result Date: 09/15/2017 CLINICAL DATA:  Abdominal pain and fever.  Abscess suspected. History of cardiac transplant.  Recent CMV colitis. EXAM: CT ABDOMEN AND PELVIS WITHOUT CONTRAST TECHNIQUE: Multidetector CT imaging of the abdomen and pelvis was performed following  the standard protocol without IV contrast. COMPARISON:  09/11/2015 FINDINGS: Lower chest: Large appearance of the left left atrium. History of cardiac transplantation. Small right pleural effusion with mild atelectatic opacity at the bases. Gynecomastia. Hepatobiliary: No focal liver abnormality.Cholecystectomy. Normal common bile duct diameter. Pancreas: Unremarkable. Spleen: Unremarkable. Adrenals/Urinary Tract: Negative adrenals. No hydronephrosis or stone. Mild symmetric renal atrophy. Unremarkable bladder. Stomach/Bowel: Colonic diverticula throughout the colon. No appendicitis. No visible pneumatosis or bowel wall thickening. There is a notable focus of mesenteric inflammation, fluid, and gas around a middle colic embolization. Vascular/Lymphatic: No acute vascular abnormality. No mass or adenopathy. Reproductive:No pathologic findings. Other: Moderate pneumoperitoneum. Small volume pelvic fluid without loculation. Musculoskeletal: No acute abnormalities. Critical Value/emergent results were called by telephone at the time of interpretation on 09/15/2017 at 9:36 am to Dr. Quintella Reichert , who verbally acknowledged these results. IMPRESSION: Bowel perforation and pneumoperitoneum. Potentially localizing pocket of interloop fluid, gas, and mesenteric inflammation in the right upper quadrant neighboring a recent middle colic branch embolization, but the colonic wall is not thickened as would be expected for necrosis. Colonic diverticula throughout the colon with none appearing inflamed. No gas accumulation or inflammatory change around the stomach and duodenum. Electronically Signed   By: Monte Fantasia M.D.   On: 09/15/2017 09:40   Dg Chest 2 View  Result Date: 09/15/2017 CLINICAL DATA:  Acute onset of generalized abdominal pain. EXAM: CHEST - 2 VIEW COMPARISON:  Chest radiograph performed 09/01/2017 FINDINGS: The lungs are mildly hypoexpanded. Mild bibasilar airspace opacities may reflect pneumonia. Mild  vascular congestion is noted. There is no evidence of pleural effusion or pneumothorax. The heart is borderline normal in size. The patient is status post median sternotomy. No acute osseous abnormalities are seen. Metallic leads are noted along the left side of the chest. A left PICC is noted ending about the mid SVC. IMPRESSION: Lungs mildly hypoexpanded. Mild bibasilar airspace opacities may reflect pneumonia. Mild vascular congestion noted. Electronically Signed   By: Garald Balding M.D.   On: 09/15/2017 02:33   STUDIES:  5/29 CXR >> Lungs mildly hypoexpanded. Mild bibasilar airspace opacities may reflect pneumonia. Mild vascular congestion noted.  5/29 CT abd/pelvis >> Bowel perforation and pneumoperitoneum. Potentially localizing pocket of interloop fluid, gas, and mesenteric inflammation in the right upper quadrant neighboring a recent middle colic branch embolization, but the colonic wall is not thickened as would be expected for necrosis. Colonic diverticula throughout the colon with none appearing inflamed. No gas accumulation or inflammatory change around the stomach and duodenum.  CULTURES: 5/29 BCx2 >> 5/29 UC >>  ANTIBIOTICS: Oral vanc completed 5/20 PTA ganciclovir   5/29 cefepime x 1  5/29 vanc >> 5/29 meropenem >>  SIGNIFICANT EVENTS: 5/29 Admit/ OR  LINES/TUBES: PTA LUE PICC  5/29 ETT >> 5/29 foley >> 5/29 OGT >> 5/29 L radial aline >> 5/29 R  DL IJ >>  DISCUSSION: 17 yoM s/p cardiac transplant 04/2017 and recent CMV colitis on ganciclovir and right colon embolization for GI bleed presenting with complaints of abd pain found to have free air. Thus taken to OR found to have acute peritonitis and right perforated colon.    ASSESSMENT / PLAN:  PULMONARY A: Acute respiratory insufficiency in the post-operative setting  P:   Full MV support, 8 cc/kg ABG and CXR now VAP protocol   CARDIOVASCULAR A:  S/p orthotopic heart transplant (04/2017 at [facility]) for  NICM w/ residual systolic HF Hx HTN,  - most recent TTE with LVEF 30% 09/08/17 - on tacrolimus and prednisone; cellcept due to be held till 09/23/17 due to infection and leukopenia P:  Tele montoring Goal MAP > 65 Trend lactate  Cardiology consulted  Assess triglycerides Pharm consult to restart tacrolimus and atovaquone Continue prednisone 10mg  daily Pending transfer to transplant facility - [facility]  RENAL A:   AKI on CKD P:   NS 500 ml bolus now, then 171ml/hr  Trend BMP / mag/ phos/ daily wts/ urinary output Replace electrolytes as indicated  GASTROINTESTINAL A:   Peritonitis and Right perforated colon s/p right colectomy and ileostomy 5/29 Recent CMV colitis and right colon embolization for GI bleed 08/2017 and recent Cdiff colitis (completed oral vanc 5/20)  P:   NPO except meds PPI for SUP Bowel regimen per PAD protocol See ID  HEMATOLOGIC A:   Leukocytosis  Hx Anemia P:  CBC now and trend  lovenox and SCD   INFECTIOUS A:   CMV colitis  Peritonitis  Immunosuppressed  P:   Hold vanc D/c cefepime Start meropenem and flagyl  Trend WBC/ fever curve Follow BC  ENDOCRINE A:   No acute issues  P:   CBG q 4 while NPO  NEUROLOGIC A:   Acute encephalopathy related to sedation P:   RASS goal: -1 PAD protocol with fentanyl gtt and propofol, minimize as able Daily wake up assessments    FAMILY  - Updates: No family at bedside.    - Inter-disciplinary family meet or Palliative Care meeting due by:  6/5  Global: patient has been accepted at [facility].  Bed assignment given and pending transport.   CCT 60 mins  Kennieth Rad, AGACNP-BC [facility] Pgr: [phone] or if no answer [phone] 09/15/2017, 3:15 PM

## 2017-09-15 NOTE — Op Note (Signed)
EXPLORATORY LAPAROTOMY  Procedure Note  [name] 09/15/2017   Pre-op Diagnosis: perforated bowel     Post-op Diagnosis: perforated right colon  Procedure(s): EXPLORATORY LAPAROTOMY RIGHT HEMICOLECTOMY ILEOSTOMY  Surgeon(s): Coralie Keens, MD  Anesthesia: General  Staff:  Circulator: Beryle Lathe, RN Physician Assistant: Vivia Ewing, PA-C Relief Scrub: Starleen Arms Scrub Person: Rolan Bucco Circulator Assistant: Sherlon Handing, RN  Estimated Blood Loss: 200 mL               Specimens: sent to path  Indications: This is a 47 year old gentleman with a history of a cardiac transplant at [facility] back in January.  He recently had CMV colitis as well as a GI bleed.  Several weeks ago, he underwent embolization of a bleeding area in the right colon by interventional radiology.  He had been doing fairly well until yesterday when he started having worsening abdominal pain.  He presented to the emergency department here in [place].  General surgery was asked to see the patient after a CT scan showed free air.  On examination, he had frank peritonitis and was tachypneic and required an emergent laparotomy.  Findings: The patient was found to have an area of perforation of the distal right colon just past the hepatic flexure.  There is moderate fibrinous exudate and fluid in the right lower abdomen and pelvis.  The right colon felt boggy.  I elected to proceed with a right colectomy and ileostomy.  No other intra-abdominal pathology was identified.  Procedure: The patient was brought to the operating room and identified as the correct patient.  He was placed supine on the operating table and general anesthesia was induced.  A central line and arterial line were placed by anesthesiology.  His abdomen was then prepped and draped in usual sterile fashion.  A created midline incision with a scalpel.  I took this down through the fascia with electrocautery.   Upon entering the abdomen, there was a slight odor and turbid fluid.  Omentum was stuck at the midline as well as the left upper quadrant of the abdominal wall and to the liver bed from his previous cholecystectomy.  All the omentum was taken down to the abdominal wall.  I evaluated the stomach and duodenum and saw no evidence of perforation there.  I then evaluated the sigmoid colon and rectum which appeared normal as well.  I then evaluated the right colon and identified an area of perforation just distal to the hepatic flexure.  There is fibrinous exudate involving the small bowel was stuck to this area as well.  At this point decision was made to proceed with a right hemicolectomy.  I mobilized the white line of [place] with electrocautery and took down the hepatic flexure with the LigaSure.  The right colon and cecum appeared quite boggy.  I then transected the distal ileum with a GIA-75 stapler.  I then transected the transverse call the GI 75 stapler as well.  I then completed the hemi-colectomy taken on the rest of the mesentery with the LigaSure.  I marked the area of perforation with a silk suture.  The specimen was then sent to pathology for evaluation.  The remaining transverse colon appeared pink and viable.  The distal ileum appeared viable as well.  At this point I copiously irrigated the abdomen with several liters of normal saline.  I made elliptical incision the patient's right mid abdomen.  I took this down to the fascia which  I opened in cruciate manner.  I then separated the underlying muscle fibers and opened the peritoneum.  I then pulled out the distal ileum as an ileostomy through this incision.  I secured the ileum to the peritoneum with several silk sutures.  I then closed the patient's midline fascia with a running #1 looped PDS suture as well as interrupted #1 Novafil internal retention sutures.  The skin was left open and packed with wet-to-dry saline gauze.  I then opened up the staple  line at the ileostomy.  The mucosa appeared pink and viable.  I then matured the ileostomy circumferentially with interrupted 3-0 Vicryl sutures.  An ostomy appliance was then applied.  At this point, the patient was hemodynamically stable.  All sponge needle and estimate counts were correct at the end of procedure.  The patient was then transferred still intubated from the operating room to the intensive care unit.  Transfer to [facility] is pending.          [name] A   Date: 09/15/2017  Time: 1:31 PM

## 2017-09-15 NOTE — Consult Note (Addendum)
Advanced Heart Failure Team Consult Note   Primary Physician: Sandi Mariscal, MD PCP-Cardiologist:  Dr Gwenyth Allegra Transplant MD: Dr Posey Pronto Reason for Consultation: H/O Heart Transplant   HPI:    [name] is seen today for evaluation of heart transplant at the request of Dr Ninfa Linden.  [name] a a 47 year old with a history of MSSA bacteremia 2017, NICM, boston scientific ICD, HTN, CKID Stage III, HMII 2016, and heart transplant at [facility] 04/21/2017.    Readmitted 06/04/2017 post OTX due to graft dysfunction. EF was 45% with mild RV dysfunction. He was placed in IV solumedrol, thymoglobulin, and IV ganciclovir.   Readmitted in May due to symptomatic anemia and PICC line malfunction. Hgb was down to 7.5. He was treated for CMV and followed by IV transplant team.  He was treated for a GI bleed and CT showed bleeding from the right hepatic flexure with resolving diverticulitis on 08/12/17. He underwent embolization of the colon. He was discharged on gancyclovir via PICC line for this.  He was last seen at transplant clinic on 09/08/17. EF was down to 30%. Low dose carvedilol was started. Creatinine was 1.7.   He presented to [facility] earlier this morning with abdominal pain. He was tachycardic. CT of abdomen showed bowel perforation. General Surgery consulted. Today he underwent emergent exploratory lap, right hemicolectomy, and ileostomy. CCM consulted. Remains intubated. BP stable. Making good urine.   Echo 09/08/2017 LVEF 30%  TEE 09/03/2017 LVEF 40%  Review of Systems: [y] = yes, [ ]  = no Patient is encephalopathic and or intubated. Therefore history has been obtained from chart review.   General: Weight gain [ ] ; Weight loss [ ] ; Anorexia [ ] ; Fatigue [Y ]; Fever [ ] ; Chills [ ] ; Weakness [ Y]  Cardiac: Chest pain/pressure [ ] ; Resting SOB [ ] ; Exertional SOB [ ] ; Orthopnea [ ] ; Pedal Edema [ ] ; Palpitations [ ] ; Syncope [ ] ; Presyncope [ ] ; Paroxysmal nocturnal dyspnea[ ]   Pulmonary: Cough [  ]; Wheezing[ ] ; Hemoptysis[ ] ; Sputum [ ] ; Snoring [ ]   GI: Vomiting[ ] ; Dysphagia[ ] ; Melena[ ] ; Hematochezia [ ] ; Heartburn[ ] ; Abdominal pain [ Y]; Constipation [ ] ; Diarrhea [ ] ; BRBPR [ ]   GU: Hematuria[ ] ; Dysuria [ ] ; Nocturia[ ]   Vascular: Pain in legs with walking [ ] ; Pain in feet with lying flat [ ] ; Non-healing sores [ ] ; Stroke [ ] ; TIA [ ] ; Slurred speech [ ] ;  Neuro: Headaches[ ] ; Vertigo[ ] ; Seizures[ ] ; Paresthesias[ ] ;Blurred vision [ ] ; Diplopia [ ] ; Vision changes [ ]   Ortho/Skin: Arthritis [ ] ; Joint pain [ ] ; Muscle pain [ ] ; Joint swelling [ ] ; Back Pain [ ] ; Rash [ ]   Psych: Depression[ ] ; Anxiety[ ]   Heme: Bleeding problems [ ] ; Clotting disorders [ ] ; Anemia [ ]   Endocrine: Diabetes [ ] ; Thyroid dysfunction[ ]   Home Medications Prior to Admission medications   Medication Sig Start Date End Date Taking? Authorizing Provider  amoxicillin (AMOXIL) 500 MG capsule Take 4 capsules (2,000 mg total) by mouth as directed. 1 hour prior to dental appointment 07/28/16  Yes Larey Dresser, MD  [facility]) 750 MG/5ML suspension Take 1,500 mg by mouth daily.   Yes [provider]  calcium-vitamin D (OSCAL WITH D) 500-200 MG-UNIT tablet Take 1 tablet by mouth 2 (two) times daily.   Yes [provider]  carvedilol (COREG) 3.125 MG tablet Take 3.125 mg by mouth 2 (two) times daily with a meal.  09/08/17  Yes [provider]  citalopram (CELEXA) 20 MG tablet Take 1 tablet (20 mg total) by mouth daily. 12/09/16  Yes [name], Shaune Pascal, MD  clotrimazole (MYCELEX) 10 MG troche Take 10 mg by mouth 2 (two) times daily.   Yes [provider]  furosemide (LASIX) 40 MG tablet Take 1 tablet (40 mg total) by mouth daily as needed (weight gain or swelling). Patient taking differently: Take 40 mg by mouth daily.  07/24/16  Yes [name], Shaune Pascal, MD  ganciclovir in sodium chloride 0.9 % 100 mL Inject 176.5 mg into the vein 2 (two) times daily. With sodium  chloride 0.9% SolP 100 mL   Yes [provider]  magnesium oxide (MAG-OX) 400 (241.3 Mg) MG tablet Take 800 mg by mouth 2 (two) times daily.   Yes [provider]  pantoprazole (PROTONIX) 40 MG tablet Take 40 mg by mouth daily. 08/22/17  Yes [provider]  pravastatin (PRAVACHOL) 40 MG tablet Take 40 mg by mouth daily. 08/25/17  Yes [provider]  predniSONE (DELTASONE) 10 MG tablet Take 10 mg by mouth daily with breakfast.   Yes [provider]  sodium chloride 0.9 % Inject 100 mLs into the vein 2 (two) times daily.   Yes [provider]  tacrolimus (PROGRAF) 1 MG capsule Take 2-3 mg by mouth See admin instructions. 3mg  every morning and 2mg  every night   Yes [provider]    Past Medical History: Past Medical History:  Diagnosis Date  . AICD (automatic cardioverter/defibrillator) present   . AKI (acute kidney injury) ([place])   . Atrial fibrillation -parosysmal     Rx w amiodarone  . Automatic implantable cardiac defibrillator -BSX    single chamber  . CHF (congestive heart failure) ([place])   . Chronic systolic heart failure (HCC)    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%)  . Elevated LFTs   . GI bleed -massive    11 Units 2012  . Gout   . H/O hyperthyroidism   . Hypertension   . Noncompliance    H/O  MEDICAL NONCOMPLIANCE  . 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 Polymorphic ventricular tachycardia (HCC)    RECURRENT WITH APPROPRIATE SHOCK THERAPY IN THE PAST  . RA (rheumatoid arthritis) ([place])   . Severe mitral regurgitation   . Tricuspid valve regurgitation    SEVERE  . Ventricular fibrillation ([place])    WITH APPROPRIATE SHOCK THERAPY IN THE PAST    Past Surgical History: Past Surgical History:  Procedure Laterality Date  . APPLICATION OF A-CELL OF EXTREMITY N/A 09/05/2015   Procedure: APPLICATION OF A-CELL OF sternum;  Surgeon: Loel Lofty Dillingham, DO;   Location: [place];  Service: Plastics;  Laterality: N/A;  . APPLICATION OF WOUND VAC N/A 08/27/2015   Procedure: APPLICATION OF WOUND VAC;  Surgeon: Ivin Poot, MD;  Location: [place];  Service: Thoracic;  Laterality: N/A;  . APPLICATION OF WOUND VAC N/A 08/29/2015   Procedure: WOUND VAC CHANGE;  Surgeon: Ivin Poot, MD;  Location: [place];  Service: Thoracic;  Laterality: N/A;  . APPLICATION OF WOUND VAC N/A 09/05/2015   Procedure: APPLICATION OF WOUND VAC to sternum;  Surgeon: Loel Lofty Dillingham, DO;  Location: [place];  Service: Plastics;  Laterality: N/A;  . CARDIAC CATHETERIZATION  06/2006   RIGHT HEART CATH SHOWING SEVERE BIVENTRICUALR CHF WITH MARKED FILLING AND PRESSURES  . CARDIAC CATHETERIZATION N/A 12/14/2014  Procedure: Right Heart Cath;  Surgeon: Jolaine Artist, MD;  Location: [place] CV LAB;  Service: Cardiovascular;  Laterality: N/A;  . CARDIAC CATHETERIZATION N/A 12/14/2014   Procedure: IABP Insertion;  Surgeon: Jolaine Artist, MD;  Location: [place] CV LAB;  Service: Cardiovascular;  Laterality: N/A;  . CARDIAC CATHETERIZATION N/A 12/18/2015   Procedure: Right Heart Cath;  Surgeon: Jolaine Artist, MD;  Location: [place] CV LAB;  Service: Cardiovascular;  Laterality: N/A;  . CHOLECYSTECTOMY    . ESOPHAGOGASTRODUODENOSCOPY N/A 09/17/2015   Procedure: ESOPHAGOGASTRODUODENOSCOPY (EGD);  Surgeon: Carol Ada, MD;  Location: [place];  Service: Gastroenterology;  Laterality: N/A;  . HEART TRANSPLANT    . IMPLANTABLE CARDIOVERTER DEFIBRILLATOR GENERATOR CHANGE N/A 07/01/2011   Procedure: IMPLANTABLE CARDIOVERTER DEFIBRILLATOR GENERATOR CHANGE;  Surgeon: Deboraha Sprang, MD;  Location: [facility] CATH LAB;  Service: Cardiovascular;  Laterality: N/A;  . INCISION AND DRAINAGE OF WOUND N/A 09/05/2015   Procedure: IRRIGATION AND DEBRIDEMENT sternal WOUND;  Surgeon: Loel Lofty Dillingham, DO;  Location: [place];  Service: Plastics;  Laterality: N/A;  . INSERT / REPLACE / REMOVE PACEMAKER      GUIDANT HE ICD MODEL 2180, SERIAL # [id number]  . INSERTION OF DIALYSIS CATHETER Right 09/05/2015   Procedure: INSERTION OF DIALYSIS CATHETER;RIGHT SUBCLAVIAN;  Surgeon: Loel Lofty Dillingham, DO;  Location: [place];  Service: Plastics;  Laterality: Right;  . INSERTION OF IMPLANTABLE LEFT VENTRICULAR ASSIST DEVICE N/A 12/20/2014   Procedure: INSERTION OF IMPLANTABLE LEFT VENTRICULAR ASSIST DEVICE;  Surgeon: Ivin Poot, MD;  Location: [place];  Service: Open Heart Surgery;  Laterality: N/A;  CIRC ARREST  NITRIC OXIDE  . PECTORALIS FLAP N/A 09/09/2015   Procedure: DEBRIDEMENT AND CLOSURE WOUND WITH PLACEMENT OF ABRA CLOSURE DEVICE;  Surgeon: Loel Lofty Dillingham, DO;  Location: [place];  Service: Plastics;  Laterality: N/A;  . STERNAL CLOSURE N/A 09/17/2015   Procedure: ABDOMINAL WOUND CLOSURE WITH INCISIONAL VAC APPLICATION;  Surgeon: Ivin Poot, MD;  Location: [place];  Service: Thoracic;  Laterality: N/A;  . STERNAL WOUND DEBRIDEMENT N/A 08/27/2015   Procedure: WOUND IRRIGATION AND DEBRIDEMENT;  Surgeon: Ivin Poot, MD;  Location: [place];  Service: Thoracic;  Laterality: N/A;  . STERNAL WOUND DEBRIDEMENT N/A 08/29/2015   Procedure: DEBRIDEMENT OF chest wound;  Surgeon: Ivin Poot, MD;  Location: [place];  Service: Thoracic;  Laterality: N/A;  . TEE WITHOUT CARDIOVERSION N/A 12/12/2014   Procedure: TRANSESOPHAGEAL ECHOCARDIOGRAM (TEE);  Surgeon: Jerline Pain, MD;  Location: [place];  Service: Cardiovascular;  Laterality: N/A;  . TEE WITHOUT CARDIOVERSION N/A 12/20/2014   Procedure: TRANSESOPHAGEAL ECHOCARDIOGRAM (TEE);  Surgeon: Ivin Poot, MD;  Location: [place];  Service: Open Heart Surgery;  Laterality: N/A;    Family History: Family History  Problem Relation Age of Onset  . Heart failure Brother     Social History: Social History   Socioeconomic History  . Marital status: Single    Spouse name: Not on file  . Number of children: Not on file  . Years of education: Not on  file  . Highest education level: Not on file  Occupational History  . Occupation: TAX Therapist, art: [employer]  Social Needs  . Financial resource strain: Not on file  . Food insecurity:    Worry: Not on file    Inability: Not on file  . Transportation needs:    Medical: Not on file    Non-medical: Not on file  Tobacco  Use  . Smoking status: Never Smoker  . Smokeless tobacco: Never Used  Substance and Sexual Activity  . Alcohol use: No  . Drug use: No  . Sexual activity: Not on file  Lifestyle  . Physical activity:    Days per week: Not on file    Minutes per session: Not on file  . Stress: Not on file  Relationships  . Social connections:    Talks on phone: Not on file    Gets together: Not on file    Attends religious service: Not on file    Active member of club or organization: Not on file    Attends meetings of clubs or organizations: Not on file    Relationship status: Not on file  Other Topics Concern  . Not on file  Social History Narrative   LIVES IN [place]   SINGLE   TOBACCO USE .Marland KitchenMarland KitchenNO   ALCOHOL USE.Marland Kitchen NO   REGULAR EXERCISE.Marland Kitchen NO   DRUG USE... NO    Allergies:  Allergies  Allergen Reactions  . Phytonadione Other (See Comments)    Patient has LVAD: please check with LVAD coordinator on call or LVAD MD on call before reversal of anticoagulation with vit k    Objective:    Vital Signs:   Temp:  [98.2 F (36.8 C)-98.9 F (37.2 C)] 98.9 F (37.2 C) (05/29 0958) Pulse Rate:  [82-110] 83 (05/29 1415) Resp:  [12-37] 12 (05/29 1415) BP: (119-148)/(88-109) 120/95 (05/29 1415) SpO2:  [91 %-100 %] 100 % (05/29 1415) Arterial Line BP: (137-139)/(79-80) 137/80 (05/29 1415) Weight:  [164 lb (74.4 kg)] 164 lb (74.4 kg) (05/29 0825)    Weight change: Filed Weights   09/15/17 0825  Weight: 164 lb (74.4 kg)    Intake/Output:   Intake/Output Summary (Last 24 hours) at 09/15/2017 1435 Last data filed at 09/15/2017 1416 Gross per 24 hour    Intake 2602.53 ml  Output 470 ml  Net 2132.53 ml      Physical Exam    General:  Sedated/intubated HEENT: normal ETT anicteric  Neck: supple. JVP 7 . Carotids 2+ bilat; no bruits. No lymphadenopathy or thyromegaly appreciated. Cor: PMI nondisplaced. Regular rate & rhythm. No rubs, gallops or murmurs. Lungs: clear no wheeze  Abdomen: dressing CDI. RLQ ileostomy with viable stoma. No bowel sounds. d bowel sounds. Extremities: no cyanosis, clubbing, rash, edema. SCDs on bilaterally  Neuro: sedated on vent.     Telemetry   NSR 80s personally reviewed.   EKG   ST 101 bpm  No ST-T wave abnormalities. Personally reviewed    Labs   Basic Metabolic Panel: Recent Labs  Lab 09/15/17 0154  NA 143  K 4.5  CL 108  CO2 22  GLUCOSE 110*  BUN 65*  CREATININE 2.56*  CALCIUM 9.6    Liver Function Tests: Recent Labs  Lab 09/15/17 0154  AST 17  ALT 20  ALKPHOS 111  BILITOT 1.5*  PROT 6.7  ALBUMIN 3.6   Recent Labs  Lab 09/15/17 0154  LIPASE 22   No results for input(s): AMMONIA in the last 168 hours.  CBC: Recent Labs  Lab 09/15/17 0154  WBC 15.3*  HGB 10.2*  HCT 33.4*  MCV 82.9  PLT 202    Cardiac Enzymes: No results for input(s): CKTOTAL, CKMB, CKMBINDEX, TROPONINI in the last 168 hours.  BNP: BNP (last 3 results) No results for input(s): BNP in the last 8760 hours.  ProBNP (last 3 results) No results for input(s): PROBNP  in the last 8760 hours.   CBG: No results for input(s): GLUCAP in the last 168 hours.  Coagulation Studies: No results for input(s): LABPROT, INR in the last 72 hours.   Imaging   Ct Abdomen Pelvis Wo Contrast  Result Date: 09/15/2017 CLINICAL DATA:  Abdominal pain and fever.  Abscess suspected. History of cardiac transplant.  Recent CMV colitis. EXAM: CT ABDOMEN AND PELVIS WITHOUT CONTRAST TECHNIQUE: Multidetector CT imaging of the abdomen and pelvis was performed following the standard protocol without IV contrast.  COMPARISON:  09/11/2015 FINDINGS: Lower chest: Large appearance of the left left atrium. History of cardiac transplantation. Small right pleural effusion with mild atelectatic opacity at the bases. Gynecomastia. Hepatobiliary: No focal liver abnormality.Cholecystectomy. Normal common bile duct diameter. Pancreas: Unremarkable. Spleen: Unremarkable. Adrenals/Urinary Tract: Negative adrenals. No hydronephrosis or stone. Mild symmetric renal atrophy. Unremarkable bladder. Stomach/Bowel: Colonic diverticula throughout the colon. No appendicitis. No visible pneumatosis or bowel wall thickening. There is a notable focus of mesenteric inflammation, fluid, and gas around a middle colic embolization. Vascular/Lymphatic: No acute vascular abnormality. No mass or adenopathy. Reproductive:No pathologic findings. Other: Moderate pneumoperitoneum. Small volume pelvic fluid without loculation. Musculoskeletal: No acute abnormalities. Critical Value/emergent results were called by telephone at the time of interpretation on 09/15/2017 at 9:36 am to Dr. Quintella Reichert , who verbally acknowledged these results. IMPRESSION: Bowel perforation and pneumoperitoneum. Potentially localizing pocket of interloop fluid, gas, and mesenteric inflammation in the right upper quadrant neighboring a recent middle colic branch embolization, but the colonic wall is not thickened as would be expected for necrosis. Colonic diverticula throughout the colon with none appearing inflamed. No gas accumulation or inflammatory change around the stomach and duodenum. Electronically Signed   By: Monte Fantasia M.D.   On: 09/15/2017 09:40   Dg Chest 2 View  Result Date: 09/15/2017 CLINICAL DATA:  Acute onset of generalized abdominal pain. EXAM: CHEST - 2 VIEW COMPARISON:  Chest radiograph performed 09/01/2017 FINDINGS: The lungs are mildly hypoexpanded. Mild bibasilar airspace opacities may reflect pneumonia. Mild vascular congestion is noted. There is no  evidence of pleural effusion or pneumothorax. The heart is borderline normal in size. The patient is status post median sternotomy. No acute osseous abnormalities are seen. Metallic leads are noted along the left side of the chest. A left PICC is noted ending about the mid SVC. IMPRESSION: Lungs mildly hypoexpanded. Mild bibasilar airspace opacities may reflect pneumonia. Mild vascular congestion noted. Electronically Signed   By: Garald Balding M.D.   On: 09/15/2017 02:33      Medications:     Current Medications: . [MAR Hold] cefoTEtan (CEFOTAN) IV  2 g Intravenous On Call to OR     Infusions: . epinephrine    . milrinone    . norepinephrine (LEVOPHED) Adult infusion    . propofol (DIPRIVAN) infusion 75 mcg/kg/min (09/15/17 1400)  . [MAR Hold] vancomycin         Patient Profile    [name] a a 47 year old with a history of MSSA bacteremia 2017, NICM, boston scientific ICD, short term HD, HMII 2016, and heart transplant at [facility] 04/21/2017.    Admitted with abdominal and perforated bowel.  Assessment/Plan   1. Abdmonal Pain with acute right colon perforation and peritonitis in setting of recent CMV Colitis and R colon embolization 08/2017  S/P Exp Lap, R hemicolectomy, and ileostomy per General Surgery  2. Acute Respiratory Insufficiency in post operative setting  CCM managing vent 3. S/P Orthotopic Heart Transplant 04/2017 ECHO  5/22 with EF down to 30%.  No bb for now.  4. CMV- 5. HTN Remains intubated. Can IV hydralazine as needed for elevated BP. Aim to keep SBP < 140.  6. CKD Stage III Baseline creatinine 2.2 -2-5.  Creatinine on admit 2.56.  7. Anemia  Hgb on admit 10.2   Dr Ninfa Linden discussed with [facility]. Plan to transfer to [facility] today.   Length of Stay: 0  Darrick Grinder, NP  09/15/2017, 2:35 PM  Advanced Heart Failure Team Pager [phone] (M-F; [place])  Please contact [facility] for night-coverage after hours (4p -7a ) and weekends on [email or web address]  Patient seen  and examined with Darrick Grinder, NP. We discussed all aspects of the encounter. I agree with the assessment and plan as stated above.   47 y/o male with recent OHtx recently struggling with CMV and cardiac rejection. EF 30%. Presented today with free air and acute peritonitis. Taken emergently to OR for ex lab found to have perforated R colon - unlcear if it was at site of previous embolization or due to CMV. Underwent resection and ileostomy.   Now intubated and sedated. On 30% FiO2. Vitals stable. Much improved.  Case d/w with surgical team and [place] Transplant team and now in process of transfer to [facility] for ongoing care. Appreciate Surgical team's emergent care.   Glori Bickers, MD  4:48 PM

## 2017-09-15 NOTE — Progress Notes (Addendum)
Pharmacy Antibiotic Note  [name] is a 47 y.o. male admitted on 09/15/2017 with abdominal pain.  Pharmacy has been consulted to transition from cefepime to meropenem for intra-abdominal infection in an immunocompromised patient.  He is s/p ex-lap with right hemicolectomy and ileostomy.  SCr 2.56, CrCL 33 ml/min, afebrile, WBC 15.3, LA down to 0.84  Plan: Merrem 2gm IV Q12H Monitor renal fxn, clinical progress   Weight: 164 lb (74.4 kg)  Temp (24hrs), Avg:98.5 F (36.9 C), Min:98.2 F (36.8 C), Max:98.9 F (37.2 C)  Recent Labs  Lab 09/15/17 0154 09/15/17 0832 09/15/17 1027  WBC 15.3*  --   --   CREATININE 2.56*  --   --   LATICACIDVEN  --  1.54 0.84    Estimated Creatinine Clearance: 32.6 mL/min (A) (by C-G formula based on SCr of 2.56 mg/dL (H)).    Allergies  Allergen Reactions  . Phytonadione Other (See Comments)    Patient has LVAD: please check with LVAD coordinator on call or LVAD MD on call before reversal of anticoagulation with vit k     Vanc 5/29 >> Cefepime 5/29 >> 5/29 Merrem 5/29 >>  5/29 BCx -  5/29 UCx -    Juanjesus Pepperman D. Mina Marble, PharmD, BCPS, [place] Pager:  [phone] 09/15/2017, 2:52 PM

## 2017-09-15 NOTE — ED Notes (Signed)
Spoke with EDP in regards to pt complaint of abd pain and his elevated WBC. NF concerned about hx of heart transplant in January. EDP states that unless pt C/O cardiac issues or meets SIRS criteria pt OK to not be rushed back to room next.

## 2017-09-15 NOTE — Discharge Summary (Signed)
[facility] Discharge Summary   Patient ID: [name] MRN: [id number] DOB/AGE: 1970/06/26 47 y.o.  Admit date: 09/15/2017 Discharge date: 09/15/2017  Admitting Diagnosis: Perforated viscous  Discharge Diagnosis Patient Active Problem List   Diagnosis Date Noted  . Perforated bowel ([place]) 09/15/2017  . Status post exploratory laparotomy 09/15/2017  . PAF (paroxysmal atrial fibrillation) ([place]) 11/18/2015  . Wound infection   . Debility 09/25/2015  . Debilitated 09/25/2015  . Chronic systolic congestive heart failure ([place])   . Colitis   . Septic shock ([place])   . Enteritis due to Clostridium difficile   . Staphylococcus aureus bacteremia   . 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[place])   . Abscess of abdominal wall   . Acute on chronic systolic (congestive) heart failure ([place])   . Left ventricular assist device (LVAD) complication 02/58/5277  . Epistaxis 05/21/2015  . Situational depression 04/13/2015  . Acute gout 01/15/2015  . Palliative care encounter 12/17/2014  . Cardiogenic shock ([place])   . Acute on chronic systolic CHF (congestive heart failure) ([place])   . Acute on chronic renal failure ([place])   . Chronic atrial fibrillation ([place])   . Low output heart failure ([place]) 12/14/2014  . Acute abdominal pain   . Near syncope   . Hypotension 11/30/2014  . AKI (acute kidney injury) ([place])   . Abnormal LFTs 10/26/2012  . Hyperthyroidism 05/26/2011  . 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 ICD-[employer] 07/22/2010  . Nonischemic cardiomyopathy ([place]) 07/22/2010  . Essential hypertension, benign 11/21/2008  . SYSTOLIC HEART FAILURE, CHRONIC 10/23/2008    Consultants [facility]CM Heart Failure team  Imaging: Ct Abdomen Pelvis Wo Contrast  Result Date: 09/15/2017 CLINICAL DATA:  Abdominal pain and fever.  Abscess suspected. History of cardiac transplant.  Recent CMV colitis. EXAM: CT ABDOMEN AND PELVIS WITHOUT CONTRAST TECHNIQUE: Multidetector  CT imaging of the abdomen and pelvis was performed following the standard protocol without IV contrast. COMPARISON:  09/11/2015 FINDINGS: Lower chest: Large appearance of the left left atrium. History of cardiac transplantation. Small right pleural effusion with mild atelectatic opacity at the bases. Gynecomastia. Hepatobiliary: No focal liver abnormality.Cholecystectomy. Normal common bile duct diameter. Pancreas: Unremarkable. Spleen: Unremarkable. Adrenals/Urinary Tract: Negative adrenals. No hydronephrosis or stone. Mild symmetric renal atrophy. Unremarkable bladder. Stomach/Bowel: Colonic diverticula throughout the colon. No appendicitis. No visible pneumatosis or bowel wall thickening. There is a notable focus of mesenteric inflammation, fluid, and gas around a middle colic embolization. Vascular/Lymphatic: No acute vascular abnormality. No mass or adenopathy. Reproductive:No pathologic findings. Other: Moderate pneumoperitoneum. Small volume pelvic fluid without loculation. Musculoskeletal: No acute abnormalities. Critical Value/emergent results were called by telephone at the time of interpretation on 09/15/2017 at 9:36 am to Dr. Quintella Reichert , who verbally acknowledged these results. IMPRESSION: Bowel perforation and pneumoperitoneum. Potentially localizing pocket of interloop fluid, gas, and mesenteric inflammation in the right upper quadrant neighboring a recent middle colic branch embolization, but the colonic wall is not thickened as would be expected for necrosis. Colonic diverticula throughout the colon with none appearing inflamed. No gas accumulation or inflammatory change around the stomach and duodenum. Electronically Signed   By: Monte Fantasia M.D.   On: 09/15/2017 09:40   Dg Chest 2 View  Result Date: 09/15/2017 CLINICAL DATA:  Acute onset of generalized abdominal pain. EXAM: CHEST - 2 VIEW COMPARISON:  Chest radiograph performed 09/01/2017 FINDINGS: The lungs are mildly hypoexpanded.  Mild bibasilar airspace opacities may reflect pneumonia. Mild vascular congestion is noted. There is no evidence of  pleural effusion or pneumothorax. The heart is borderline normal in size. The patient is status post median sternotomy. No acute osseous abnormalities are seen. Metallic leads are noted along the left side of the chest. A left PICC is noted ending about the mid SVC. IMPRESSION: Lungs mildly hypoexpanded. Mild bibasilar airspace opacities may reflect pneumonia. Mild vascular congestion noted. Electronically Signed   By: Garald Balding M.D.   On: 09/15/2017 02:33   Dg Chest Port 1 View  Result Date: 09/15/2017 CLINICAL DATA:  Ventilator support. EXAM: PORTABLE CHEST 1 VIEW COMPARISON:  Earlier same day. FINDINGS: Endotracheal tube tip is 6 cm above the carina. Nasogastric tube just enters the stomach. Left arm PICC tip is in the SVC 3 cm above the right atrium. Right internal jugular central line is in the SVC 1 cm above the right atrium. There is better aeration in the right lower lobe. Slightly worsened volume loss in the left lower lobe. Small amount of free intraperitoneal air/gas as was shown at CT. IMPRESSION: Lines and tubes satisfactorily positioned. Small amount of free intraperitoneal air as was shown at CT. Worsened volume loss in the left lower lobe. Electronically Signed   By: Nelson Chimes M.D.   On: 09/15/2017 16:35    Procedures Dr. Ninfa Linden (09/15/17) - exploratory laparotomy, right hemicolectomy, end ileostomy  Hospital Course:  Patient is a 47 year old male with complicated medical history including recent heart transplant, CMV colitis and recent embolization of GI bleed at [facility]. He presented to [facility] with abdominal pain.  Workup showed pneumoperitoneum.  Patient was taken to the OR emergently for exploratory surgery. Underwent procedure listed above. He remained intubated post-operatively and was taken to the ICU. Situation was communicated with transplant team  at [facility] and transport arranged post-operatively. Patient to be transferred to [facility] via [facility] He is being accepted by Dr. Kinnie Scales after discussions with Dr. Ninfa Linden.  He has been seen By CCM and Dr. Haroldine Laws.  He is clinically stable at the time of transfer, he is still on the ventilator with good blood pressures and is hemodynamically stable.  He is NPO and we are leaving him on just IV medicine currently.   He has undergone exploratory laparotomy, right hemicolectomy and ileostomy.  Fascia is closed but the wound is open.  He is being transferred to [facility] for care after his heart transplant.  [facility] is where his Transplant was done.    Prior to Admission medications   Medication Sig Start Date End Date Taking? Authorizing Provider  amoxicillin (AMOXIL) 500 MG capsule Take 4 capsules (2,000 mg total) by mouth as directed. 1 hour prior to dental appointment 07/28/16  Yes Larey Dresser, MD  calcium-vitamin D (OSCAL WITH D) 500-200 MG-UNIT tablet Take 1 tablet by mouth 2 (two) times daily.   Yes [provider]  clotrimazole (MYCELEX) 10 MG troche Take 10 mg by mouth 2 (two) times daily.   Yes [provider]  furosemide (LASIX) 40 MG tablet Take 1 tablet (40 mg total) by mouth daily as needed (weight gain or swelling). Patient taking differently: Take 40 mg by mouth daily.  07/24/16  Yes [name], Shaune Pascal, MD  ganciclovir in sodium chloride 0.9 % 100 mL Inject 176.5 mg into the vein 2 (two) times daily. With sodium chloride 0.9% SolP 100 mL   Yes [provider]  magnesium oxide (MAG-OX) 400 (241.3 Mg) MG tablet Take 800 mg by mouth 2 (two) times daily.  Yes [provider]  pravastatin (PRAVACHOL) 40 MG tablet Take 40 mg by mouth daily. 08/25/17  Yes [provider]  sodium chloride 0.9 % Inject 100 mLs into the vein 2 (two) times daily.   Yes [provider]  atovaquone (MEPRON) 750 MG/5ML suspension Take 10 mLs (1,500 mg total)  by mouth daily. 09/15/17   Earnstine Regal, PA-C  atovaquone [facility]) 750 MG/5ML suspension [facility] will adjust 09/15/17   Earnstine Regal, PA-C  carvedilol (COREG) 3.125 MG tablet [facility] will adjust 09/15/17   Earnstine Regal, PA-C  citalopram (CELEXA) 20 MG tablet [facility] will adjust 09/15/17   Earnstine Regal, PA-C  docusate (COLACE) 50 MG/5ML liquid Place 10 mLs (100 mg total) into feeding tube 2 (two) times daily as needed for mild constipation. 09/15/17   Earnstine Regal, PA-C  enoxaparin (LOVENOX) 40 MG/0.4ML injection Inject 0.4 mLs (40 mg total) into the skin daily. 09/16/17   Earnstine Regal, PA-C  famotidine (PEPCID) 20-0.9 MG/50ML-% Inject 50 mLs (20 mg total) into the vein daily. 09/16/17   Earnstine Regal, PA-C  fentaNYL (SUBLIMAZE) SOLN Inject 50 mcg into the vein every hour as needed. 09/15/17   Earnstine Regal, PA-C  fentaNYL 10 mcg/ml SOLN infusion Inject 25-400 mcg/hr into the vein continuous. 09/15/17   Earnstine Regal, PA-C  meropenem 2 g in sodium chloride 0.9 % 100 mL Inject 2 g into the vein every 12 (twelve) hours. 09/15/17   Earnstine Regal, PA-C  metroNIDAZOLE (FLAGYL) 5-0.79 MG/ML-% IVPB Inject 100 mLs (500 mg total) into the vein every 8 (eight) hours. 09/15/17   Earnstine Regal, PA-C  pantoprazole (PROTONIX) 40 MG tablet [facility] will adjust 09/15/17   Earnstine Regal, PA-C  predniSONE (DELTASONE) 10 MG tablet [facility] will adjust 09/15/17   Earnstine Regal, PA-C  predniSONE 5 MG/5ML solution Take 10 mLs (10 mg total) by mouth daily with breakfast. 09/15/17   Earnstine Regal, PA-C  propofol (DIPRIVAN) 1000 MG/100ML EMUL injection Inject 372-3,720 mcg/min into the vein continuous. 09/15/17   Earnstine Regal, PA-C  sodium chloride 0.9 % infusion Inject 75 mLs into the vein continuous. 09/15/17   Earnstine Regal, PA-C  tacrolimus (PROGRAF) 1 MG capsule [facility] will adjust 09/15/17   Earnstine Regal, PA-C   Antibiotics Given (last 72 hours)    Date/Time Action Medication  Dose Rate   09/15/17 0934 New Bag/Given   ceFEPIme (MAXIPIME) 1 g in sodium chloride 0.9 % 100 mL IVPB 1 g 200 mL/hr   09/15/17 1022 New Bag/Given   vancomycin (VANCOCIN) 1,250 mg in sodium chloride 0.9 % 250 mL IVPB 1,250 mg 166.7 mL/hr   09/15/17 1543 New Bag/Given   metroNIDAZOLE (FLAGYL) IVPB 500 mg 500 mg 100 mL/hr     Anti-infectives (From admission, onward)   Start     Dose/Rate Route Frequency Ordered Stop   09/16/17 0930  vancomycin (VANCOCIN) IVPB 1000 mg/200 mL premix  Status:  Discontinued     1,000 mg 200 mL/hr over 60 Minutes Intravenous Every 24 hours 09/15/17 0854 09/15/17 1517   09/15/17 1700  meropenem (MERREM) 2 g in sodium chloride 0.9 % 100 mL IVPB     2 g 200 mL/hr over 30 Minutes Intravenous Every 12 hours 09/15/17 1453     09/15/17 1630  atovaquone (MEPRON) 750 MG/5ML suspension 1,500 mg     1,500 mg Per Tube Daily with breakfast 09/15/17 1615     09/15/17 1515  metroNIDAZOLE (FLAGYL) IVPB 500 mg     500 mg 100 mL/hr over 60 Minutes  Intravenous Every 8 hours 09/15/17 1503     09/15/17 1100  cefoTEtan in Dextrose 5% (CEFOTAN) IVPB 2 g  Status:  Discontinued     2 g Intravenous On call to O.R. 09/15/17 1052 09/15/17 1440   09/15/17 0930  vancomycin (VANCOCIN) 1,250 mg in sodium chloride 0.9 % 250 mL IVPB     1,250 mg 166.7 mL/hr over 90 Minutes Intravenous  Once 09/15/17 0854 09/15/17 1416   09/15/17 0930  ceFEPIme (MAXIPIME) 1 g in sodium chloride 0.9 % 100 mL IVPB  Status:  Discontinued     1 g 200 mL/hr over 30 Minutes Intravenous Every 24 hours 09/15/17 0854 09/15/17 1434   09/15/17 0000  atovaquone (MEPRON) 750 MG/5ML suspension     1,500 mg Oral Daily 09/15/17 1656     09/15/17 0000  atovaquone (MEPRON) 750 MG/5ML suspension        09/15/17 1656     09/15/17 0000  meropenem 2 g in sodium chloride 0.9 % 100 mL     2 g 200 mL/hr over 30 Minutes Intravenous Every 12 hours 09/15/17 1656     09/15/17 0000  metroNIDAZOLE (FLAGYL) 5-0.79 MG/ML-% IVPB     500  mg 100 mL/hr over 60 Minutes Intravenous Every 8 hours 09/15/17 1656       CBC Latest Ref Rng & Units 09/15/2017 09/15/2017 09/15/2017  WBC 4.0 - 10.5 K/uL 7.1 - -  Hemoglobin 13.0 - 17.0 g/dL 9.8(L) 9.2(L) 9.5(L)  Hematocrit 39.0 - 52.0 % 31.8(L) 27.0(L) 28.0(L)  Platelets 150 - 400 K/uL 160 - -   CMP Latest Ref Rng & Units 09/15/2017 09/15/2017 09/15/2017  Glucose 65 - 99 mg/dL 113(H) - -  BUN 6 - 20 mg/dL 66(H) - -  Creatinine 0.61 - 1.24 mg/dL 2.23(H) - -  Sodium 135 - 145 mmol/L 143 141 143  Potassium 3.5 - 5.1 mmol/L 4.1 4.1 4.3  Chloride 101 - 111 mmol/L 114(H) - -  CO2 22 - 32 mmol/L 18(L) - -  Calcium 8.9 - 10.3 mg/dL 8.5(L) - -  Total Protein 6.5 - 8.1 g/dL 4.7(L) - -  Total Bilirubin 0.3 - 1.2 mg/dL 2.2(H) - -  Alkaline Phos 38 - 126 U/L 95 - -  AST 15 - 41 U/L 16 - -  ALT 17 - 63 U/L 19 - -     Allergies as of 09/15/2017      Reactions   Phytonadione Other (See Comments)   Patient has LVAD: please check with LVAD coordinator on call or LVAD MD on call before reversal of anticoagulation with vit k  Of note he does not have an LVAD this is in his allergy listing, not sure why but it is incorrect.    Medication List    STOP taking these medications   amoxicillin 500 MG capsule Commonly known as:  AMOXIL   calcium-vitamin D 500-200 MG-UNIT tablet Commonly known as:  OSCAL WITH D   clotrimazole 10 MG troche Commonly known as:  MYCELEX   magnesium oxide 400 (241.3 Mg) MG tablet Commonly known as:  MAG-OX     TAKE these medications   atovaquone 750 MG/5ML suspension Commonly known as:  MEPRON Take 10 mLs (1,500 mg total) by mouth daily. What changed:  Another medication with the same name was added. Make sure you understand how and when to take each.   atovaquone 750 MG/5ML suspension Commonly known as:  MEPRON [facility] will adjust What changed:  You were already taking a  medication with the same name, and this prescription was added. Make sure you understand how  and when to take each.   carvedilol 3.125 MG tablet Commonly known as:  COREG [facility] will adjust What changed:    how much to take  how to take this  when to take this  additional instructions   citalopram 20 MG tablet Commonly known as:  CELEXA [facility] will adjust What changed:    how much to take  how to take this  when to take this  additional instructions   docusate 50 MG/5ML liquid Commonly known as:  COLACE Place 10 mLs (100 mg total) into feeding tube 2 (two) times daily as needed for mild constipation.   enoxaparin 40 MG/0.4ML injection Commonly known as:  LOVENOX Inject 0.4 mLs (40 mg total) into the skin daily. Start taking on:  09/16/2017   famotidine 20-0.9 MG/50ML-% Commonly known as:  PEPCID Inject 50 mLs (20 mg total) into the vein daily. Start taking on:  09/16/2017   fentaNYL 10 mcg/ml Soln infusion Inject 25-400 mcg/hr into the vein continuous.   fentaNYL Soln Commonly known as:  SUBLIMAZE Inject 50 mcg into the vein every hour as needed.   furosemide 40 MG tablet Commonly known as:  LASIX Take 1 tablet (40 mg total) by mouth daily as needed (weight gain or swelling). What changed:  when to take this   ganciclovir in sodium chloride 0.9 % 100 mL Inject 176.5 mg into the vein 2 (two) times daily. With sodium chloride 0.9% SolP 100 mL   meropenem 2 g in sodium chloride 0.9 % 100 mL Inject 2 g into the vein every 12 (twelve) hours.   metroNIDAZOLE 5-0.79 MG/ML-% IVPB Commonly known as:  FLAGYL Inject 100 mLs (500 mg total) into the vein every 8 (eight) hours.   pantoprazole 40 MG tablet Commonly known as:  PROTONIX [facility] will adjust What changed:    how much to take  how to take this  when to take this  additional instructions   pravastatin 40 MG tablet Commonly known as:  PRAVACHOL Take 40 mg by mouth daily.   predniSONE 10 MG tablet Commonly known as:  DELTASONE [facility] will adjust What changed:    how much to take  how to  take this  when to take this  additional instructions   predniSONE 5 MG/5ML solution Take 10 mLs (10 mg total) by mouth daily with breakfast. What changed:  You were already taking a medication with the same name, and this prescription was added. Make sure you understand how and when to take each.   propofol 1000 MG/100ML Emul injection Commonly known as:  DIPRIVAN Inject 372-3,720 mcg/min into the vein continuous.   sodium chloride 0.9 % infusion Inject 75 mLs into the vein continuous. What changed:    how much to take  when to take this   tacrolimus 1 MG capsule Commonly known as:  PROGRAF [facility] will adjust What changed:    how much to take  how to take this  when to take this  additional instructions        Follow-up Information    Coralie Keens, MD Follow up.   Specialty:  General Surgery Why:  call after discharge from [facility] for follow up appointment Contact information: [address] [place] [address] [phone]        [name], Shaune Pascal, MD Follow up.   Specialty:  Cardiology Why:  call for follow up  after discharge from [facility]. Contact information: [place] [address] [phone]           Signed: Brigid Re, [facility] 09/15/2017, 5:00 PM Pager: [phone] Consults: [phone] Mon-Fri 7:00 am-4:30 pm Sat-Sun 7:00 am-11:30 am

## 2017-09-15 NOTE — ED Notes (Signed)
PA notified of completion of fluid bolus

## 2017-09-15 NOTE — ED Provider Notes (Signed)
Physical Exam  BP (!) 136/101 (BP Location: Left Arm)   Pulse (!) 101   Temp 98.4 F (36.9 C) (Oral)   Resp 17   SpO2 96%   Assumed care from Dr. Ralene Bathe after patient evaluated in triage. Per Dr. Ralene Bathe, patient had peritonitis but negative upright chest, and Dr. Ralene Bathe placed order for CT scan in triage.  Without contrast, as patient has a history of CKD.    Briefly, the patient is a 47 y.o. 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 here with sudden onset diffuse abdominal pain that began around 1 AM.  Patient presents per EMS.  Patient recently hospitalized at [facility] for C. difficile colitis and CMV colitis.  Patient receives regular ganciclovir infusions via PICC line for prophylaxis for CMV.  Patient had normal bowel movement this morning without diarrhea, melena or hematochezia.  Patient independently evaluated by Dr. Merrily Pew immediately after my evaluation.  Labs Reviewed  COMPREHENSIVE METABOLIC PANEL - Abnormal; Notable for the following components:      Result Value   Glucose, Bld 110 (*)    BUN 65 (*)    Creatinine, Ser 2.56 (*)    Total Bilirubin 1.5 (*)    GFR calc non Af Amer 28 (*)    GFR calc Af Amer 32 (*)    All other components within normal limits  CBC - Abnormal; Notable for the following components:   WBC 15.3 (*)    RBC 4.03 (*)    Hemoglobin 10.2 (*)    HCT 33.4 (*)     MCH 25.3 (*)    RDW 18.4 (*)    All other components within normal limits  URINALYSIS, ROUTINE W REFLEX MICROSCOPIC - Abnormal; Notable for the following components:   Ketones, ur 5 (*)    Protein, ur 30 (*)    Bacteria, UA RARE (*)    All other components within normal limits  CULTURE, BLOOD (ROUTINE X 2)  CULTURE, BLOOD (ROUTINE X 2)  URINE CULTURE  LIPASE, BLOOD  OCCULT BLOOD X 1 CARD TO LAB, STOOL  I-STAT TROPONIN, ED  I-STAT CG4 LACTIC ACID, ED  I-STAT CG4 LACTIC ACID, ED  I-STAT CG4 LACTIC ACID, ED    Course of Care:  Physical Exam  Constitutional: He appears well-developed and well-nourished. No distress.  HENT:  Head: Normocephalic and atraumatic.  Mouth/Throat: Oropharynx is clear and moist.  Eyes: Pupils are equal, round, and reactive to light. Conjunctivae and EOM are normal.  Neck: Normal range of motion. Neck supple.  Cardiovascular: Regular rhythm, S1 normal and S2 normal.  Tachycardia noted.  AICD in place in left upper chest.  Pulmonary/Chest: Effort normal. He has no wheezes. He has rales.  Coarse lung sounds in bilateral lower lung fields.  Abdominal: Bowel sounds are normal. There is generalized tenderness. There is no guarding.  Abdomen diffusely tender to percussion.  Musculoskeletal: Normal range of motion. He exhibits no edema or deformity.  Lymphadenopathy:    He has no cervical adenopathy.  Neurological: He is alert.  Cranial nerves grossly intact. Patient moves extremities  symmetrically and with good coordination.  Skin: Skin is warm and dry. No rash noted. No erythema.  Psychiatric: He has a normal mood and affect. His behavior is normal. Judgment and thought content normal.  Nursing note and vitals reviewed.   ED Course/Procedures   Clinical Course as of Sep 15 1717  Wed Sep 15, 2017  0841 Code sepsis called and antibiotics initiated after review of patient record of recent admission, and diagnoses at [facility].  Patient meets criteria with  SIRS criteria as well as presumed source of infection in the abdomen.  Favoring atelectasis in the lungs versus pneumonia, but patient covered with vancomycin and cefepime.  Patient completed his course of oral vancomycin on 09-07-2017 per [facility] for C. difficile colitis, and that patient needs antibiotic coverage at this time.  Additionally, patient has a ejection fraction of 30%, and is not hypotensive, therefore will provide soft fluid repletion with 500 mL of normal saline at this time.  Discussed plan of care with Dr. Merrily Pew.   [AM]  [phone] Spoke with Dr. Ralene Bathe, EDP who initially placed order.  Patient has diffuse free air in the abdomen.  Surgical consult called per Dr. Dayna Barker.   [AM]  [phone] Spoke with intensivist, Dr. Elsworth Soho who recommends that if patient is not going to immediately take patient to Or, recommend transfer to [facility] as soon as possible.   [AM]  37 PA for general surgery paged to discuss patient case and whether or not patient will be transferred to the [place] urgently.   [AM]  1034 Spoke with PA will of general surgery, who evaluated the patient, and is contacting Dr. Ninfa Linden to discuss emergent transfer to the OR.  Should this occur, patient would not be transferred to [place] at this time.   [AM]  21 Dr. Ninfa Linden and PA will of general surgery at bedside.  Plan is to take patient emergently to the operating room.  Page placed to Dr. Elsworth Soho to discuss plan.   [AM]  1106 Spoke with Wannetta Sender of cardiology who recommends calling back to cardiology once patient is out of OR for further assistance in management while awaiting possible transfer.    [AM]    Clinical Course User Index [AM] Albesa Seen, PA-C    Procedures  MDM   Patient is critically ill requiring emergent interventions for lifesaving treatment.  Patient afebrile via rectal temp, but tachycardic in the 100-110 range, as well as tachypneic, initial examination.  Abdomen peritonitic, identified by MD who saw patient in  triage who recommended immediate transfer to ED room.  Orders placed per this MD. Patient exhibits a leukocytosis of 15.3.  Last known value was less than 3, as patient was neutropenic while admitted at [facility].  Patient received GCSF for leukopenia during his admission at [facility].  Patient also had tacrolimus dose increased at that time.  Patient's hemoglobin is at his baseline of 9-10.  Creatinine is slightly elevated from baseline.  Please see below for prior values.  Initial lactic acid is normal at 1.54.  Patient does have fecal occult blood positive.  Lab Results  Component Value Date   CREATININE 2.56 (H) 09/15/2017   CREATININE 1.72 (H) 08/31/2017   CREATININE 2.21 (H) 08/06/2017   Per chart review, patient finished last dose of oral vancomycin on 09-07-2017.  Antibiotics initiated on patient prior to CT scan read.  Demonstrates diffuse free air.  Unclear etiology of the free air.  General surgery at [facility] consulted,  as clinical team has concerns about stability of patient during transfer.  Patient's primary cardiology and transplant care is at [facility].  General surgery to perform exploratory lapratomy of patient at [facility], and then coordinate postoperative care with ICU and transfer to [facility] as soon as possible.    Albesa Seen, PA-C 09/15/17 1721    Merrily Pew, MD 09/16/17 1015

## 2017-09-15 NOTE — Anesthesia Procedure Notes (Signed)
Procedure Name: Intubation Date/Time: 09/15/2017 11:53 AM Performed by: Babs Bertin, CRNA Pre-anesthesia Checklist: Patient identified, Emergency Drugs available, Suction available and Patient being monitored Patient Re-evaluated:Patient Re-evaluated prior to induction Oxygen Delivery Method: Circle System Utilized Preoxygenation: Pre-oxygenation with 100% oxygen Induction Type: IV induction, Rapid sequence and Cricoid Pressure applied Grade View: Grade I Tube type: Subglottic suction tube Tube size: 7.5 mm Number of attempts: 1 Airway Equipment and Method: Stylet and Video-laryngoscopy (elective glidescope) Placement Confirmation: ETT inserted through vocal cords under direct vision,  positive ETCO2 and breath sounds checked- equal and bilateral Secured at: 22 cm Tube secured with: Tape Dental Injury: Teeth and Oropharynx as per pre-operative assessment

## 2017-09-16 ENCOUNTER — Encounter (HOSPITAL_COMMUNITY): Payer: Self-pay | Admitting: Surgery

## 2017-09-16 LAB — BLOOD CULTURE ID PANEL (REFLEXED)
Acinetobacter baumannii: NOT DETECTED
CANDIDA GLABRATA: NOT DETECTED
CANDIDA KRUSEI: NOT DETECTED
CANDIDA PARAPSILOSIS: NOT DETECTED
CARBAPENEM RESISTANCE: NOT DETECTED
Candida albicans: NOT DETECTED
Candida tropicalis: NOT DETECTED
ESCHERICHIA COLI: DETECTED — AB
Enterobacter cloacae complex: DETECTED — AB
Enterobacteriaceae species: DETECTED — AB
Enterococcus species: NOT DETECTED
Haemophilus influenzae: NOT DETECTED
KLEBSIELLA OXYTOCA: NOT DETECTED
KLEBSIELLA PNEUMONIAE: DETECTED — AB
Listeria monocytogenes: NOT DETECTED
Neisseria meningitidis: NOT DETECTED
Proteus species: NOT DETECTED
Pseudomonas aeruginosa: NOT DETECTED
STAPHYLOCOCCUS AUREUS BCID: NOT DETECTED
STAPHYLOCOCCUS SPECIES: NOT DETECTED
STREPTOCOCCUS PYOGENES: NOT DETECTED
Serratia marcescens: NOT DETECTED
Streptococcus agalactiae: NOT DETECTED
Streptococcus pneumoniae: NOT DETECTED
Streptococcus species: NOT DETECTED

## 2017-09-16 LAB — URINE CULTURE: Culture: <10000 — AB

## 2017-09-16 LAB — GLUCOSE, CAPILLARY: Glucose-Capillary: 88 mg/dL (ref 65–99)

## 2017-09-16 NOTE — Anesthesia Postprocedure Evaluation (Signed)
Anesthesia Post Note  Patient: [name]  Procedure(s) Performed: EXPLORATORY LAPAROTOMY; RIGHT HEMICOLECTOMY (N/A Abdomen) ILEOSTOMY (N/A Abdomen)     Patient location during evaluation: SICU Anesthesia Type: General Level of consciousness: sedated Pain management: pain level controlled Vital Signs Assessment: post-procedure vital signs reviewed and stable Respiratory status: patient remains intubated per anesthesia plan Cardiovascular status: stable Postop Assessment: no apparent nausea or vomiting Anesthetic complications: no    Last Vitals:  Vitals:   09/15/17 1642 09/15/17 1645  BP: 132/77 118/87  Pulse: 91   Resp: 16 14  Temp:    SpO2: 100%     Last Pain:  Vitals:   09/15/17 0958  TempSrc: Rectal  PainSc:                  Oona Trammel

## 2017-09-19 LAB — CULTURE, BLOOD (ROUTINE X 2)

## 2017-10-06 ENCOUNTER — Telehealth (HOSPITAL_COMMUNITY): Payer: Self-pay

## 2017-10-06 NOTE — Telephone Encounter (Signed)
Attempted to contact patient in regards to Cardiac Rehab - lm on vm °

## 2017-10-18 ENCOUNTER — Encounter (HOSPITAL_COMMUNITY): Payer: Self-pay

## 2017-10-18 ENCOUNTER — Telehealth (HOSPITAL_COMMUNITY): Payer: Self-pay

## 2017-10-18 NOTE — Telephone Encounter (Signed)
2nd attempt to contact patient in regards to Cardiac Rehab - vm is full. Sending letter.

## 2017-10-25 ENCOUNTER — Telehealth (HOSPITAL_COMMUNITY): Payer: Self-pay

## 2017-10-25 NOTE — Telephone Encounter (Signed)
3rd attempt to call patient in regards to [place] full, unable to LM.

## 2017-10-29 ENCOUNTER — Encounter: Payer: Self-pay | Admitting: Cardiology

## 2017-11-01 ENCOUNTER — Telehealth (HOSPITAL_COMMUNITY): Payer: Self-pay

## 2017-11-01 NOTE — Telephone Encounter (Signed)
No response  from patient - closed referral

## 2018-10-18 ENCOUNTER — Ambulatory Visit (HOSPITAL_COMMUNITY)
Admission: RE | Admit: 2018-10-18 | Discharge: 2018-10-18 | Disposition: A | Discharge status: Home / Self Care | Payer: 59 | Source: Ambulatory Visit | Attending: Cardiology | Admitting: Cardiology

## 2018-10-18 ENCOUNTER — Other Ambulatory Visit (HOSPITAL_COMMUNITY): Payer: Self-pay | Admitting: *Deleted

## 2018-10-18 ENCOUNTER — Other Ambulatory Visit: Payer: Self-pay

## 2018-10-18 ENCOUNTER — Other Ambulatory Visit (HOSPITAL_COMMUNITY): Payer: 59

## 2018-10-18 ENCOUNTER — Telehealth (HOSPITAL_COMMUNITY): Payer: Self-pay | Admitting: *Deleted

## 2018-10-18 DIAGNOSIS — Z941 Heart transplant status: Secondary | ICD-10-CM

## 2018-10-18 LAB — CBC WITH DIFFERENTIAL/PLATELET
Abs Immature Granulocytes: 0.07 10*3/uL (ref 0.00–0.07)
Basophils Absolute: 0 10*3/uL (ref 0.0–0.1)
Basophils Relative: 1 %
Eosinophils Absolute: 0 10*3/uL (ref 0.0–0.5)
Eosinophils Relative: 0 %
HCT: 26.5 % — ABNORMAL LOW (ref 39.0–52.0)
Hemoglobin: 6.4 g/dL — CL (ref 13.0–17.0)
Immature Granulocytes: 1 %
Lymphocytes Relative: 11 %
Lymphs Abs: 0.7 10*3/uL (ref 0.7–4.0)
MCH: 16.5 pg — ABNORMAL LOW (ref 26.0–34.0)
MCHC: 24.2 g/dL — ABNORMAL LOW (ref 30.0–36.0)
MCV: 68.5 fL — ABNORMAL LOW (ref 80.0–100.0)
Monocytes Absolute: 0.9 10*3/uL (ref 0.1–1.0)
Monocytes Relative: 13 %
Neutro Abs: 4.9 10*3/uL (ref 1.7–7.7)
Neutrophils Relative %: 74 %
Platelets: 242 10*3/uL (ref 150–400)
RBC: 3.87 MIL/uL — ABNORMAL LOW (ref 4.22–5.81)
RDW: 23.3 % — ABNORMAL HIGH (ref 11.5–15.5)
WBC: 6.7 10*3/uL (ref 4.0–10.5)
nRBC: 0.6 % — ABNORMAL HIGH (ref 0.0–0.2)

## 2018-10-18 LAB — COMPREHENSIVE METABOLIC PANEL
ALT: 9 U/L (ref 0–44)
AST: 13 U/L — ABNORMAL LOW (ref 15–41)
Albumin: 3.8 g/dL (ref 3.5–5.0)
Alkaline Phosphatase: 109 U/L (ref 38–126)
Anion gap: 9 (ref 5–15)
BUN: 54 mg/dL — ABNORMAL HIGH (ref 6–20)
CO2: 19 mmol/L — ABNORMAL LOW (ref 22–32)
Calcium: 9.9 mg/dL (ref 8.9–10.3)
Chloride: 113 mmol/L — ABNORMAL HIGH (ref 98–111)
Creatinine, Ser: 3.83 mg/dL — ABNORMAL HIGH (ref 0.61–1.24)
GFR calc Af Amer: 20 mL/min — ABNORMAL LOW (ref >60)
GFR calc non Af Amer: 17 mL/min — ABNORMAL LOW (ref >60)
Glucose, Bld: 125 mg/dL — ABNORMAL HIGH (ref 70–99)
Potassium: 4.1 mmol/L (ref 3.5–5.1)
Sodium: 141 mmol/L (ref 135–145)
Total Bilirubin: 0.4 mg/dL (ref 0.3–1.2)
Total Protein: 6.2 g/dL — ABNORMAL LOW (ref 6.5–8.1)

## 2018-10-18 NOTE — Telephone Encounter (Signed)
[facility] office to inform them I faxed CBC and CMP results on this patient to their office. Informed them of critical Hgb 6.4 today. Secretary will get "results to appropriate Coordinator".  Called pt and informed him of low hgb and elevated creat. He has f/u with [facility] Thursday of this week. Denies any active bleeding and no dark tarry stools. He says he feels "great" and denies any SOB or weakness. Stressed importance of f/u with [facility] later this week. Instructed him to call Transplant Coordinator or come to ED if he develops any symptoms of active bleeding, SOB, weakness. Pt verbalized understanding of same.   Zada Girt, RN, [place] Coordinator [phone]

## 2018-11-08 ENCOUNTER — Ambulatory Visit (HOSPITAL_COMMUNITY)
Admission: RE | Admit: 2018-11-08 | Discharge: 2018-11-08 | Disposition: A | Discharge status: Home / Self Care | Payer: 59 | Source: Ambulatory Visit | Attending: Internal Medicine | Admitting: Internal Medicine

## 2018-11-08 ENCOUNTER — Other Ambulatory Visit: Payer: Self-pay

## 2018-11-08 ENCOUNTER — Encounter (HOSPITAL_COMMUNITY): Payer: Self-pay | Admitting: *Deleted

## 2018-11-08 DIAGNOSIS — I5022 Chronic systolic (congestive) heart failure: Secondary | ICD-10-CM | POA: Diagnosis not present

## 2018-11-08 LAB — COMPREHENSIVE METABOLIC PANEL
ALT: 8 U/L (ref 0–44)
AST: 13 U/L — ABNORMAL LOW (ref 15–41)
Albumin: 4.1 g/dL (ref 3.5–5.0)
Alkaline Phosphatase: 118 U/L (ref 38–126)
Anion gap: 10 (ref 5–15)
BUN: 59 mg/dL — ABNORMAL HIGH (ref 6–20)
CO2: 17 mmol/L — ABNORMAL LOW (ref 22–32)
Calcium: 11.3 mg/dL — ABNORMAL HIGH (ref 8.9–10.3)
Chloride: 114 mmol/L — ABNORMAL HIGH (ref 98–111)
Creatinine, Ser: 4.66 mg/dL — ABNORMAL HIGH (ref 0.61–1.24)
GFR calc Af Amer: 16 mL/min — ABNORMAL LOW (ref >60)
GFR calc non Af Amer: 14 mL/min — ABNORMAL LOW (ref >60)
Glucose, Bld: 94 mg/dL (ref 70–99)
Potassium: 5 mmol/L (ref 3.5–5.1)
Sodium: 141 mmol/L (ref 135–145)
Total Bilirubin: 0.9 mg/dL (ref 0.3–1.2)
Total Protein: 6.5 g/dL (ref 6.5–8.1)

## 2018-11-08 NOTE — Progress Notes (Signed)
CMET results faxed to [facility] Trans[facility] Coordinator.   Emerson Monte RN [place] Coordinator  Office: [phone]  24/7 Pager: [phone]

## 2018-12-05 ENCOUNTER — Other Ambulatory Visit (HOSPITAL_COMMUNITY): Payer: Self-pay | Admitting: *Deleted

## 2018-12-05 DIAGNOSIS — Z941 Heart transplant status: Secondary | ICD-10-CM

## 2018-12-12 ENCOUNTER — Other Ambulatory Visit: Payer: Self-pay

## 2018-12-12 ENCOUNTER — Ambulatory Visit (HOSPITAL_COMMUNITY)
Admission: RE | Admit: 2018-12-12 | Discharge: 2018-12-12 | Disposition: A | Discharge status: Home / Self Care | Payer: 59 | Source: Ambulatory Visit | Attending: Cardiology | Admitting: Cardiology

## 2018-12-12 DIAGNOSIS — Z941 Heart transplant status: Secondary | ICD-10-CM | POA: Diagnosis present

## 2018-12-12 LAB — BASIC METABOLIC PANEL
Anion gap: 8 (ref 5–15)
BUN: 44 mg/dL — ABNORMAL HIGH (ref 6–20)
CO2: 16 mmol/L — ABNORMAL LOW (ref 22–32)
Calcium: 9.3 mg/dL (ref 8.9–10.3)
Chloride: 119 mmol/L — ABNORMAL HIGH (ref 98–111)
Creatinine, Ser: 3.74 mg/dL — ABNORMAL HIGH (ref 0.61–1.24)
GFR calc Af Amer: 21 mL/min — ABNORMAL LOW (ref >60)
GFR calc non Af Amer: 18 mL/min — ABNORMAL LOW (ref >60)
Glucose, Bld: 88 mg/dL (ref 70–99)
Potassium: 4.2 mmol/L (ref 3.5–5.1)
Sodium: 143 mmol/L (ref 135–145)

## 2018-12-13 LAB — TACROLIMUS LEVEL: Tacrolimus (FK506) - LabCorp: 9.3 ng/mL (ref 2.0–20.0)

## 2019-09-08 ENCOUNTER — Other Ambulatory Visit: Payer: Self-pay

## 2019-09-08 ENCOUNTER — Ambulatory Visit (HOSPITAL_COMMUNITY)
Admission: EM | Admit: 2019-09-08 | Discharge: 2019-09-08 | Disposition: A | Discharge status: Home / Self Care | Payer: 59 | Attending: Physician Assistant | Admitting: Physician Assistant

## 2019-09-08 ENCOUNTER — Encounter (HOSPITAL_COMMUNITY): Payer: Self-pay

## 2019-09-08 DIAGNOSIS — Z20822 Contact with and (suspected) exposure to covid-19: Secondary | ICD-10-CM | POA: Insufficient documentation

## 2019-09-08 NOTE — Discharge Instructions (Signed)
If your Covid-19 test is positive, you will receive a phone call from [facility] regarding your results. Negative test results are not called. Both positive and negative results area always visible on MyChart. If you do not have a MyChart account, sign up instructions are in your discharge papers.   Persons who are directed to care for themselves at home may discontinue isolation under the following conditions:   At least 10 days have passed since symptom onset and  At least 24 hours have passed without running a fever (this means without the use of fever-reducing medications) and  Other symptoms have improved.  Persons infected with COVID-19 who never develop symptoms may discontinue isolation and other precautions 10 days after the date of their first positive COVID-19 test.

## 2019-09-08 NOTE — ED Provider Notes (Signed)
Learned    CSN: [id number] Arrival date & time: 09/08/19  1429      History   Chief Complaint Chief Complaint  Patient presents with  . Covid Test for Travel    HPI [name] is a 49 y.o. male.   Patient reports for Covid testing for travel.  Denies all symptoms.  Denies cough, congestion, headache, sore throat, fever, chills, abdominal pain, nausea, vomiting, diarrhea.  Denies change in taste or smell.  No Covid exposures.     Past Medical History:  Diagnosis Date  . AICD (automatic cardioverter/defibrillator) present   . AKI (acute kidney injury) ([place])   . Atrial fibrillation -parosysmal     Rx w amiodarone  . Automatic implantable cardiac defibrillator -BSX    single chamber  . CHF (congestive heart failure) ([place])   . Chronic systolic heart failure (HCC)    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%)  . Elevated LFTs   . GI bleed -massive    11 Units 2012  . Gout   . H/O hyperthyroidism   . Hypertension   . Noncompliance    H/O  MEDICAL NONCOMPLIANCE  . 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 Polymorphic ventricular tachycardia (HCC)    RECURRENT WITH APPROPRIATE SHOCK THERAPY IN THE PAST  . RA (rheumatoid arthritis) ([place])   . Severe mitral regurgitation   . Tricuspid valve regurgitation    SEVERE  . Ventricular fibrillation ([place])    WITH APPROPRIATE SHOCK THERAPY IN THE PAST    Patient Active Problem List   Diagnosis Date Noted  . Perforated bowel ([place]) 09/15/2017  . Status post exploratory laparotomy 09/15/2017  . PAF (paroxysmal atrial fibrillation) ([place]) 11/18/2015  . Wound infection   . Debility 09/25/2015  . Debilitated 09/25/2015  . Chronic systolic congestive heart failure ([place])   . Colitis   . Septic shock ([place])   . Enteritis due to Clostridium difficile   . Staphylococcus aureus bacteremia   . 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[place])   . Abscess of abdominal  wall   . Acute on chronic systolic (congestive) heart failure ([place])   . Left ventricular assist device (LVAD) complication 99/24/2683  . Epistaxis 05/21/2015  . Situational depression 04/13/2015  . Acute gout 01/15/2015  . Palliative care encounter 12/17/2014  . Cardiogenic shock ([place])   . Acute on chronic systolic CHF (congestive heart failure) ([place])   . Acute on chronic renal failure ([place])   . Chronic atrial fibrillation ([place])   . Low output heart failure ([place]) 12/14/2014  . Acute abdominal pain   . Near syncope   . Hypotension 11/30/2014  . AKI (acute kidney injury) ([place])   . Abnormal LFTs 10/26/2012  . Hyperthyroidism 05/26/2011  . 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 ICD-[employer] 07/22/2010  . Nonischemic cardiomyopathy ([place]) 07/22/2010  . Essential hypertension, benign 11/21/2008  . SYSTOLIC HEART FAILURE, CHRONIC 10/23/2008    Past Surgical History:  Procedure Laterality Date  . APPLICATION OF A-CELL OF EXTREMITY N/A 09/05/2015   Procedure: APPLICATION OF A-CELL OF sternum;  Surgeon: Loel Lofty Dillingham, DO;  Location: [place];  Service: Plastics;  Laterality: N/A;  . APPLICATION OF WOUND VAC N/A 08/27/2015   Procedure: APPLICATION OF WOUND VAC;  Surgeon: Ivin Poot, MD;  Location: [facility] OR;  Service: Thoracic;  Laterality: N/A;  . APPLICATION OF WOUND VAC N/A 08/29/2015   Procedure: WOUND VAC CHANGE;  Surgeon: Tharon Aquas  Kerby Less, MD;  Location: [place] OR;  Service: Thoracic;  Laterality: N/A;  . APPLICATION OF WOUND VAC N/A 09/05/2015   Procedure: APPLICATION OF WOUND VAC to sternum;  Surgeon: Loel Lofty Dillingham, DO;  Location: [place];  Service: Plastics;  Laterality: N/A;  . CARDIAC CATHETERIZATION  06/2006   RIGHT HEART CATH SHOWING SEVERE BIVENTRICUALR CHF WITH MARKED FILLING AND PRESSURES  . CARDIAC CATHETERIZATION N/A 12/14/2014   Procedure: Right Heart Cath;  Surgeon: Jolaine Artist, MD;  Location: [place] CV LAB;  Service: Cardiovascular;   Laterality: N/A;  . CARDIAC CATHETERIZATION N/A 12/14/2014   Procedure: IABP Insertion;  Surgeon: Jolaine Artist, MD;  Location: [place] CV LAB;  Service: Cardiovascular;  Laterality: N/A;  . CARDIAC CATHETERIZATION N/A 12/18/2015   Procedure: Right Heart Cath;  Surgeon: Jolaine Artist, MD;  Location: [place] CV LAB;  Service: Cardiovascular;  Laterality: N/A;  . CHOLECYSTECTOMY    . ESOPHAGOGASTRODUODENOSCOPY N/A 09/17/2015   Procedure: ESOPHAGOGASTRODUODENOSCOPY (EGD);  Surgeon: Carol Ada, MD;  Location: [place];  Service: Gastroenterology;  Laterality: N/A;  . HEART TRANSPLANT    . ILEOSTOMY N/A 09/15/2017   Procedure: ILEOSTOMY;  Surgeon: Coralie Keens, MD;  Location: [place];  Service: General;  Laterality: N/A;  . IMPLANTABLE CARDIOVERTER DEFIBRILLATOR GENERATOR CHANGE N/A 07/01/2011   Procedure: IMPLANTABLE CARDIOVERTER DEFIBRILLATOR GENERATOR CHANGE;  Surgeon: Deboraha Sprang, MD;  Location: [facility] CATH LAB;  Service: Cardiovascular;  Laterality: N/A;  . INCISION AND DRAINAGE OF WOUND N/A 09/05/2015   Procedure: IRRIGATION AND DEBRIDEMENT sternal WOUND;  Surgeon: Loel Lofty Dillingham, DO;  Location: [place];  Service: Plastics;  Laterality: N/A;  . INSERT / REPLACE / REMOVE PACEMAKER     GUIDANT HE ICD MODEL 2180, SERIAL # [id number]  . INSERTION OF DIALYSIS CATHETER Right 09/05/2015   Procedure: INSERTION OF DIALYSIS CATHETER;RIGHT SUBCLAVIAN;  Surgeon: Loel Lofty Dillingham, DO;  Location: [place];  Service: Plastics;  Laterality: Right;  . INSERTION OF IMPLANTABLE LEFT VENTRICULAR ASSIST DEVICE N/A 12/20/2014   Procedure: INSERTION OF IMPLANTABLE LEFT VENTRICULAR ASSIST DEVICE;  Surgeon: Ivin Poot, MD;  Location: [place];  Service: Open Heart Surgery;  Laterality: N/A;  CIRC ARREST  NITRIC OXIDE  . LAPAROTOMY N/A 09/15/2017   Procedure: EXPLORATORY LAPAROTOMY; RIGHT HEMICOLECTOMY;  Surgeon: Coralie Keens, MD;  Location: [place];  Service: General;  Laterality: N/A;  . PECTORALIS  FLAP N/A 09/09/2015   Procedure: DEBRIDEMENT AND CLOSURE WOUND WITH PLACEMENT OF ABRA CLOSURE DEVICE;  Surgeon: Loel Lofty Dillingham, DO;  Location: [place];  Service: Plastics;  Laterality: N/A;  . STERNAL CLOSURE N/A 09/17/2015   Procedure: ABDOMINAL WOUND CLOSURE WITH INCISIONAL VAC APPLICATION;  Surgeon: Ivin Poot, MD;  Location: [place];  Service: Thoracic;  Laterality: N/A;  . STERNAL WOUND DEBRIDEMENT N/A 08/27/2015   Procedure: WOUND IRRIGATION AND DEBRIDEMENT;  Surgeon: Ivin Poot, MD;  Location: [place];  Service: Thoracic;  Laterality: N/A;  . STERNAL WOUND DEBRIDEMENT N/A 08/29/2015   Procedure: DEBRIDEMENT OF chest wound;  Surgeon: Ivin Poot, MD;  Location: [place];  Service: Thoracic;  Laterality: N/A;  . TEE WITHOUT CARDIOVERSION N/A 12/12/2014   Procedure: TRANSESOPHAGEAL ECHOCARDIOGRAM (TEE);  Surgeon: Jerline Pain, MD;  Location: [place];  Service: Cardiovascular;  Laterality: N/A;  . TEE WITHOUT CARDIOVERSION N/A 12/20/2014   Procedure: TRANSESOPHAGEAL ECHOCARDIOGRAM (TEE);  Surgeon: Ivin Poot, MD;  Location: [place];  Service: Open Heart Surgery;  Laterality: N/A;  Home Medications    Prior to Admission medications   Medication Sig Start Date End Date Taking? Authorizing Provider  atovaquone (MEPRON) 750 MG/5ML suspension Take 10 mLs (1,500 mg total) by mouth daily. 09/15/17   Earnstine Regal, PA-C  atovaquone [facility]) 750 MG/5ML suspension [facility] will adjust 09/15/17   Earnstine Regal, PA-C  carvedilol (COREG) 3.125 MG tablet [facility] will adjust 09/15/17   Earnstine Regal, PA-C  citalopram (CELEXA) 20 MG tablet [facility] will adjust 09/15/17   Earnstine Regal, PA-C  docusate (COLACE) 50 MG/5ML liquid Place 10 mLs (100 mg total) into feeding tube 2 (two) times daily as needed for mild constipation. 09/15/17   Earnstine Regal, PA-C  enoxaparin (LOVENOX) 40 MG/0.4ML injection Inject 0.4 mLs (40 mg total) into the skin daily. 09/16/17   Earnstine Regal, PA-C    famotidine (PEPCID) 20-0.9 MG/50ML-% Inject 50 mLs (20 mg total) into the vein daily. 09/16/17   Earnstine Regal, PA-C  fentaNYL (SUBLIMAZE) SOLN Inject 50 mcg into the vein every hour as needed. 09/15/17   Earnstine Regal, PA-C  fentaNYL 10 mcg/ml SOLN infusion Inject 25-400 mcg/hr into the vein continuous. 09/15/17   Earnstine Regal, PA-C  furosemide (LASIX) 40 MG tablet Take 1 tablet (40 mg total) by mouth daily as needed (weight gain or swelling). Patient taking differently: Take 40 mg by mouth daily.  07/24/16   [name], Shaune Pascal, MD  ganciclovir in sodium chloride 0.9 % 100 mL Inject 176.5 mg into the vein 2 (two) times daily. With sodium chloride 0.9% SolP 100 mL    [provider]  meropenem 2 g in sodium chloride 0.9 % 100 mL Inject 2 g into the vein every 12 (twelve) hours. 09/15/17   Earnstine Regal, PA-C  metroNIDAZOLE (FLAGYL) 5-0.79 MG/ML-% IVPB Inject 100 mLs (500 mg total) into the vein every 8 (eight) hours. 09/15/17   Earnstine Regal, PA-C  pantoprazole (PROTONIX) 40 MG tablet [facility] will adjust 09/15/17   Earnstine Regal, PA-C  pravastatin (PRAVACHOL) 40 MG tablet Take 40 mg by mouth daily. 08/25/17   [provider]  predniSONE (DELTASONE) 10 MG tablet [facility] will adjust 09/15/17   Earnstine Regal, PA-C  predniSONE 5 MG/5ML solution Take 10 mLs (10 mg total) by mouth daily with breakfast. 09/15/17   Earnstine Regal, PA-C  propofol (DIPRIVAN) 1000 MG/100ML EMUL injection Inject 372-3,720 mcg/min into the vein continuous. 09/15/17   Earnstine Regal, PA-C  sodium chloride 0.9 % infusion Inject 75 mLs into the vein continuous. 09/15/17   Earnstine Regal, PA-C  tacrolimus (PROGRAF) 1 MG capsule [facility] will adjust 09/15/17   Earnstine Regal, PA-C    Family History Family History  Problem Relation Age of Onset  . Heart failure Brother     Social History Social History   Tobacco Use  . Smoking status: Never Smoker  . Smokeless tobacco: Never Used   Substance Use Topics  . Alcohol use: No  . Drug use: No     Allergies   Phytonadione   Review of Systems Review of Systems   Physical Exam Triage Vital Signs ED Triage Vitals [09/08/19 1535]  Enc Vitals Group     BP (!) 152/103     Pulse Rate 86     Resp 18     Temp 98.2 F (36.8 C)     Temp Source Oral     SpO2 100 %     Weight      Height      Head Circumference  Peak Flow      Pain Score      Pain Loc      Pain Edu?      Excl. in [place]?    No data found.  Updated Vital Signs BP (!) 152/103 (BP Location: Left Arm)   Pulse 86   Temp 98.2 F (36.8 C) (Oral)   Resp 18   SpO2 100%   Visual Acuity Right Eye Distance:   Left Eye Distance:   Bilateral Distance:    Right Eye Near:   Left Eye Near:    Bilateral Near:     Physical Exam Vitals and nursing note reviewed.  Constitutional:      Appearance: Normal appearance.  HENT:     Head: Normocephalic and atraumatic.  Neurological:     Mental Status: He is alert.      UC Treatments / Results  Labs (all labs ordered are listed, but only abnormal results are displayed) Labs Reviewed  SARS CORONAVIRUS 2 (TAT 6-24 HRS)    EKG   Radiology No results found.  Procedures Procedures (including critical care time)  Medications Ordered in UC Medications - No data to display  Initial Impression / Assessment and Plan / UC Course  I have reviewed the triage vital signs and the nursing notes.  Pertinent labs & imaging results that were available during my care of the patient were reviewed by me and considered in my medical decision making (see chart for details).     #Encounter for Covid testing Patient is a 49 year old presenting for Covid testing for travel.  No symptoms.  Covid PCR sent.  Final Clinical Impressions(s) / UC Diagnoses   Final diagnoses:  Encounter for laboratory testing for COVID-19 virus     Discharge Instructions     If your Covid-19 test is positive, you will  receive a phone call from [facility] regarding your results. Negative test results are not called. Both positive and negative results area always visible on MyChart. If you do not have a MyChart account, sign up instructions are in your discharge papers.   Persons who are directed to care for themselves at home may discontinue isolation under the following conditions:  . At least 10 days have passed since symptom onset and . At least 24 hours have passed without running a fever (this means without the use of fever-reducing medications) and . Other symptoms have improved.  Persons infected with COVID-19 who never develop symptoms may discontinue isolation and other precautions 10 days after the date of their first positive COVID-19 test.     ED Prescriptions    None     PDMP not reviewed this encounter.   Purnell Shoemaker, PA-C 09/08/19 1554

## 2019-09-08 NOTE — ED Triage Notes (Signed)
Pt presents for covid test for travel with no known symptoms.

## 2019-09-09 LAB — SARS CORONAVIRUS 2 (TAT 6-24 HRS): SARS Coronavirus 2: NEGATIVE

## 2019-10-06 ENCOUNTER — Other Ambulatory Visit (HOSPITAL_COMMUNITY): Payer: Self-pay | Admitting: Unknown Physician Specialty

## 2019-10-06 DIAGNOSIS — Z941 Heart transplant status: Secondary | ICD-10-CM

## 2019-10-10 ENCOUNTER — Ambulatory Visit (HOSPITAL_COMMUNITY)
Admission: RE | Admit: 2019-10-10 | Discharge: 2019-10-10 | Disposition: A | Discharge status: Home / Self Care | Payer: 59 | Source: Ambulatory Visit | Attending: Cardiology | Admitting: Cardiology

## 2019-10-10 ENCOUNTER — Other Ambulatory Visit: Payer: Self-pay

## 2019-10-10 DIAGNOSIS — Z941 Heart transplant status: Secondary | ICD-10-CM | POA: Insufficient documentation

## 2019-10-10 LAB — BASIC METABOLIC PANEL
Anion gap: 9 (ref 5–15)
BUN: 53 mg/dL — ABNORMAL HIGH (ref 6–20)
CO2: 17 mmol/L — ABNORMAL LOW (ref 22–32)
Calcium: 9.1 mg/dL (ref 8.9–10.3)
Chloride: 115 mmol/L — ABNORMAL HIGH (ref 98–111)
Creatinine, Ser: 4.17 mg/dL — ABNORMAL HIGH (ref 0.61–1.24)
GFR calc Af Amer: 18 mL/min — ABNORMAL LOW (ref >60)
GFR calc non Af Amer: 15 mL/min — ABNORMAL LOW (ref >60)
Glucose, Bld: 100 mg/dL — ABNORMAL HIGH (ref 70–99)
Potassium: 4.1 mmol/L (ref 3.5–5.1)
Sodium: 141 mmol/L (ref 135–145)

## 2019-10-13 ENCOUNTER — Encounter (HOSPITAL_COMMUNITY): Payer: Self-pay | Admitting: Unknown Physician Specialty

## 2019-10-13 LAB — TACROLIMUS LEVEL: Tacrolimus (FK506) - LabCorp: 9.1 ng/mL (ref 2.0–20.0)

## 2019-10-13 NOTE — Progress Notes (Signed)
[place] 506 and BMET results faxed to [facility].  Tanda Rockers RN, BSN VAD Coordinator 24/7 Pager [phone]

## 2019-10-24 ENCOUNTER — Other Ambulatory Visit (HOSPITAL_COMMUNITY): Payer: Self-pay | Admitting: *Deleted

## 2019-10-24 DIAGNOSIS — Z941 Heart transplant status: Secondary | ICD-10-CM

## 2019-10-25 ENCOUNTER — Ambulatory Visit (HOSPITAL_COMMUNITY)
Admission: RE | Admit: 2019-10-25 | Discharge: 2019-10-25 | Disposition: A | Discharge status: Home / Self Care | Payer: 59 | Source: Ambulatory Visit | Attending: Internal Medicine | Admitting: Internal Medicine

## 2019-10-25 ENCOUNTER — Other Ambulatory Visit: Payer: Self-pay

## 2019-10-25 DIAGNOSIS — Z941 Heart transplant status: Secondary | ICD-10-CM | POA: Diagnosis not present

## 2019-10-25 LAB — BASIC METABOLIC PANEL
Anion gap: 9 (ref 5–15)
BUN: 58 mg/dL — ABNORMAL HIGH (ref 6–20)
CO2: 18 mmol/L — ABNORMAL LOW (ref 22–32)
Calcium: 9.5 mg/dL (ref 8.9–10.3)
Chloride: 115 mmol/L — ABNORMAL HIGH (ref 98–111)
Creatinine, Ser: 4.07 mg/dL — ABNORMAL HIGH (ref 0.61–1.24)
GFR calc Af Amer: 18 mL/min — ABNORMAL LOW (ref >60)
GFR calc non Af Amer: 16 mL/min — ABNORMAL LOW (ref >60)
Glucose, Bld: 94 mg/dL (ref 70–99)
Potassium: 3.7 mmol/L (ref 3.5–5.1)
Sodium: 142 mmol/L (ref 135–145)

## 2019-10-26 LAB — TACROLIMUS LEVEL: Tacrolimus (FK506) - LabCorp: 7.6 ng/mL (ref 2.0–20.0)

## 2019-11-02 ENCOUNTER — Encounter (HOSPITAL_COMMUNITY): Payer: Self-pay | Admitting: *Deleted

## 2019-11-02 NOTE — Progress Notes (Unsigned)
[place] 506 and BMET results faxed to [facility].  Zada Girt RN, [place] Coordinator [phone]

## 2019-12-19 ENCOUNTER — Ambulatory Visit: Payer: Self-pay | Attending: Critical Care Medicine

## 2019-12-19 DIAGNOSIS — Z23 Encounter for immunization: Secondary | ICD-10-CM

## 2019-12-19 NOTE — Progress Notes (Signed)
   Covid-19 Vaccination Clinic  Name:  Keahi Mccarney    MRN: [id number] DOB: 30-Jul-1970  12/19/2019  Mr. Oleson was observed post Covid-19 immunization for 15 minutes without incident. He was provided with Vaccine Information Sheet and instruction to access the V-Safe system.   Mr. Carstens was instructed to call [phone] with any severe reactions post vaccine: Marland Kitchen Difficulty breathing  . Swelling of face and throat  . A fast heartbeat  . A bad rash all over body  . Dizziness and weakness

## 2020-04-20 DIAGNOSIS — U071 COVID-19: Secondary | ICD-10-CM

## 2020-04-20 HISTORY — DX: COVID-19: U07.1

## 2020-09-12 ENCOUNTER — Encounter (HOSPITAL_COMMUNITY): Payer: Self-pay

## 2020-11-05 ENCOUNTER — Ambulatory Visit (HOSPITAL_COMMUNITY)
Admission: RE | Admit: 2020-11-05 | Discharge: 2020-11-05 | Disposition: A | Discharge status: Home / Self Care | Payer: Medicare Other | Source: Ambulatory Visit | Attending: Internal Medicine | Admitting: Internal Medicine

## 2020-11-05 ENCOUNTER — Encounter (HOSPITAL_COMMUNITY): Payer: Self-pay

## 2020-11-05 ENCOUNTER — Emergency Department (HOSPITAL_BASED_OUTPATIENT_CLINIC_OR_DEPARTMENT_OTHER): Payer: Medicare Other

## 2020-11-05 ENCOUNTER — Emergency Department (HOSPITAL_COMMUNITY): Payer: Medicare Other

## 2020-11-05 ENCOUNTER — Other Ambulatory Visit: Payer: Self-pay

## 2020-11-05 ENCOUNTER — Emergency Department (HOSPITAL_COMMUNITY)
Admission: EM | Admit: 2020-11-05 | Discharge: 2020-11-05 | Disposition: A | Discharge status: Other Acute Inpatient Hospital | Payer: Medicare Other | Attending: Emergency Medicine | Admitting: Emergency Medicine

## 2020-11-05 DIAGNOSIS — R1110 Vomiting, unspecified: Secondary | ICD-10-CM | POA: Diagnosis not present

## 2020-11-05 DIAGNOSIS — Z941 Heart transplant status: Secondary | ICD-10-CM

## 2020-11-05 DIAGNOSIS — R197 Diarrhea, unspecified: Secondary | ICD-10-CM | POA: Diagnosis present

## 2020-11-05 DIAGNOSIS — R0602 Shortness of breath: Secondary | ICD-10-CM | POA: Diagnosis not present

## 2020-11-05 DIAGNOSIS — U071 COVID-19: Secondary | ICD-10-CM | POA: Insufficient documentation

## 2020-11-05 DIAGNOSIS — N189 Chronic kidney disease, unspecified: Secondary | ICD-10-CM | POA: Diagnosis not present

## 2020-11-05 DIAGNOSIS — Z79899 Other long term (current) drug therapy: Secondary | ICD-10-CM | POA: Diagnosis not present

## 2020-11-05 DIAGNOSIS — N179 Acute kidney failure, unspecified: Secondary | ICD-10-CM

## 2020-11-05 DIAGNOSIS — I130 Hypertensive heart and chronic kidney disease with heart failure and stage 1 through stage 4 chronic kidney disease, or unspecified chronic kidney disease: Secondary | ICD-10-CM | POA: Diagnosis not present

## 2020-11-05 DIAGNOSIS — I5023 Acute on chronic systolic (congestive) heart failure: Secondary | ICD-10-CM | POA: Insufficient documentation

## 2020-11-05 DIAGNOSIS — Z7901 Long term (current) use of anticoagulants: Secondary | ICD-10-CM | POA: Insufficient documentation

## 2020-11-05 DIAGNOSIS — R000 Tachycardia, unspecified: Secondary | ICD-10-CM | POA: Insufficient documentation

## 2020-11-05 LAB — ECHOCARDIOGRAM COMPLETE
AR max vel: 3.52 cm2
AV Area VTI: 2.86 cm2
AV Area mean vel: 3.15 cm2
AV Mean grad: 3 mmHg
AV Peak grad: 5.9 mmHg
Ao pk vel: 1.21 m/s
Area-P 1/2: 6.9 cm2
Calc EF: 42 %
S' Lateral: 2.6 cm
Single Plane A2C EF: 24.4 %
Single Plane A4C EF: 49.7 %

## 2020-11-05 LAB — URINALYSIS, ROUTINE W REFLEX MICROSCOPIC
Bilirubin Urine: NEGATIVE
Glucose, UA: NEGATIVE mg/dL
Ketones, ur: NEGATIVE mg/dL
Nitrite: NEGATIVE
Protein, ur: 30 mg/dL — AB
Specific Gravity, Urine: 1.011 (ref 1.005–1.030)
WBC, UA: >50 WBC/hpf — ABNORMAL HIGH (ref 0–5)
pH: 5 (ref 5.0–8.0)

## 2020-11-05 LAB — COMPREHENSIVE METABOLIC PANEL
ALT: 17 U/L (ref 0–44)
AST: 22 U/L (ref 15–41)
Albumin: 4.3 g/dL (ref 3.5–5.0)
Alkaline Phosphatase: 154 U/L — ABNORMAL HIGH (ref 38–126)
Anion gap: 25 — ABNORMAL HIGH (ref 5–15)
BUN: 158 mg/dL — ABNORMAL HIGH (ref 6–20)
CO2: 7 mmol/L — ABNORMAL LOW (ref 22–32)
Calcium: 9.7 mg/dL (ref 8.9–10.3)
Chloride: 109 mmol/L (ref 98–111)
Creatinine, Ser: 10.42 mg/dL — ABNORMAL HIGH (ref 0.61–1.24)
GFR, Estimated: 5 mL/min — ABNORMAL LOW (ref >60)
Glucose, Bld: 113 mg/dL — ABNORMAL HIGH (ref 70–99)
Potassium: 5.4 mmol/L — ABNORMAL HIGH (ref 3.5–5.1)
Sodium: 141 mmol/L (ref 135–145)
Total Bilirubin: 0.9 mg/dL (ref 0.3–1.2)
Total Protein: 8.4 g/dL — ABNORMAL HIGH (ref 6.5–8.1)

## 2020-11-05 LAB — I-STAT BETA HCG BLOOD, ED (MC, WL, AP ONLY): I-stat hCG, quantitative: <5 m[IU]/mL (ref <5)

## 2020-11-05 LAB — CBC WITH DIFFERENTIAL/PLATELET
Abs Immature Granulocytes: 0.17 10*3/uL — ABNORMAL HIGH (ref 0.00–0.07)
Basophils Absolute: 0 10*3/uL (ref 0.0–0.1)
Basophils Relative: 0 %
Eosinophils Absolute: 0 10*3/uL (ref 0.0–0.5)
Eosinophils Relative: 0 %
HCT: 52.8 % — ABNORMAL HIGH (ref 39.0–52.0)
Hemoglobin: 15.4 g/dL (ref 13.0–17.0)
Immature Granulocytes: 1 %
Lymphocytes Relative: 6 %
Lymphs Abs: 0.7 10*3/uL (ref 0.7–4.0)
MCH: 22.5 pg — ABNORMAL LOW (ref 26.0–34.0)
MCHC: 29.2 g/dL — ABNORMAL LOW (ref 30.0–36.0)
MCV: 77.3 fL — ABNORMAL LOW (ref 80.0–100.0)
Monocytes Absolute: 1.8 10*3/uL — ABNORMAL HIGH (ref 0.1–1.0)
Monocytes Relative: 15 %
Neutro Abs: 9.2 10*3/uL — ABNORMAL HIGH (ref 1.7–7.7)
Neutrophils Relative %: 78 %
Platelets: 182 10*3/uL (ref 150–400)
RBC: 6.83 MIL/uL — ABNORMAL HIGH (ref 4.22–5.81)
RDW: 18.6 % — ABNORMAL HIGH (ref 11.5–15.5)
WBC: 11.9 10*3/uL — ABNORMAL HIGH (ref 4.0–10.5)
nRBC: 0.3 % — ABNORMAL HIGH (ref 0.0–0.2)

## 2020-11-05 LAB — LACTIC ACID, PLASMA
Lactic Acid, Venous: 1.5 mmol/L (ref 0.5–1.9)
Lactic Acid, Venous: 1.3 mmol/L (ref 0.5–1.9)

## 2020-11-05 LAB — RESP PANEL BY RT-PCR (FLU A&B, COVID) ARPGX2
Influenza A by PCR: NEGATIVE
Influenza B by PCR: NEGATIVE
SARS Coronavirus 2 by RT PCR: POSITIVE — AB

## 2020-11-05 LAB — LIPASE, BLOOD: Lipase: 84 U/L — ABNORMAL HIGH (ref 11–51)

## 2020-11-05 MED ORDER — LACTATED RINGERS IV SOLN: INTRAVENOUS | Status: DC

## 2020-11-05 MED ORDER — LACTATED RINGERS IV BOLUS
1000.0000 mL | Freq: Once | INTRAVENOUS | Status: AC
  Administered 2020-11-05: 1000 mL via INTRAVENOUS

## 2020-11-05 MED ORDER — ONDANSETRON HCL 4 MG/2ML IJ SOLN
4.0000 mg | Freq: Once | INTRAMUSCULAR | Status: AC
  Administered 2020-11-05: 4 mg via INTRAVENOUS
  Filled 2020-11-05: qty 2

## 2020-11-05 MED ORDER — FENTANYL CITRATE (PF) 100 MCG/2ML IJ SOLN
50.0000 ug | Freq: Once | INTRAMUSCULAR | Status: AC
  Administered 2020-11-05: 50 ug via INTRAVENOUS
  Filled 2020-11-05: qty 2

## 2020-11-05 MED ORDER — SODIUM CHLORIDE 0.9 % IV BOLUS
1000.0000 mL | Freq: Once | INTRAVENOUS | Status: AC
  Administered 2020-11-05: 1000 mL via INTRAVENOUS

## 2020-11-05 NOTE — ED Triage Notes (Signed)
Pt here POV with c/o of N/V and weakness X2 days. Previous LVAD pt, now recipient of heart transplant. Unable to keep rejection meds down.

## 2020-11-05 NOTE — ED Notes (Signed)
Patient signed consent form to transport/transfer to [facility].

## 2020-11-05 NOTE — ED Notes (Signed)
Report given to [facility] accepting RN ( [phone])  , transport service arrived , documents given to transport service .

## 2020-11-05 NOTE — Consult Note (Addendum)
Advanced Heart Failure Team Consult Note   Primary Physician: Sandi Mariscal, MD PCP-Cardiologist:  None  Reason for Consultation: Heart Failure/S/P OHT   HPI:    [name] is seen today for evaluation of heart failure/S/P OHT  at the request of Dr Regenia Skeeter.   [name] is a 50 year old with history of CKD Stage IIIb, diverticulitis requiring ileostomy 2020,  HTN, had LVAD then later had OHT 04/2017 at [facility].   Followed at [facility] by Dr Haroldine Laws. Underwent HM II VAD implantation on 12/20/14. 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   Admitted from [facility] in [place] in 5/17 with LVAD driveline abscess. Hospitalization was complicated by septic shock, MSSA bacteremia, CDiff, and AKI.He underwent surgical I&D of wound with exposed LVAD hardware. TEE did not show any vegetation on valves or ICD wires. Over the course of his hospitalization he required multiple blood products due to symptomatic anemia and blood loss from abdominal wound. Treated with IV abx for multiple weeks. Developed AKI/ESRD and required HD for several months before recovering.  January 2019 underwent OHT at [facility], CMV colitis and embolization of GI bleed.   May 2019 had exploratory laparotomy, right hemicolectomy, end ileostomy. He was then transferred to [facility] for care.   He was last seen at [facility] 10/04/20 by Dr Posey Pronto. Had ECHO during that showed LVEF 45%. He was continued on statin, tacrolmus M-W-F, prednisone for immunosuppression. Also discussed possible ileostomy takedown. Labs during that visit showed Tacrolimus level 8.4, WBC 11.8, HGb 10.8,  and creatinine 3.1 . Coreg was increased to 12.5 mg twice a day due to hypertension.   Over the 48-72 hours he developed nausea/vomiting. Unable to take eat or keep medications down. He called his [facility] transplant coordinator and he was instructed to go to [facility] ED. He arrived at [facility] and was unable to get out of his car and  required wheelchair.   In the ED given zofran and getting IV fluids.   Feels terrible. Denies fever or chills. Says he is having increased output from ileostomy. Has not been around anyone sick.   Echo 09/2020 EF 45% ECHO 03/2020 EF 45-50%  Echo 09/2019 EF 40%   Review of Systems: [y] = yes, '[ ]'$  = no   General: Weight gain '[ ]'$ ; Weight loss '[ ]'$ ; Anorexia '[ ]'$ ; Fatigue [ Y]; Fever '[ ]'$ ; Chills '[ ]'$ ; Weakness '[ ]'$   Cardiac: Chest pain/pressure '[ ]'$ ; Resting SOB '[ ]'$ ; Exertional SOB '[ ]'$ ; Orthopnea '[ ]'$ ; Pedal Edema '[ ]'$ ; Palpitations '[ ]'$ ; Syncope '[ ]'$ ; Presyncope '[ ]'$ ; Paroxysmal nocturnal dyspnea'[ ]'$   Pulmonary: Cough '[ ]'$ ; Wheezing'[ ]'$ ; Hemoptysis'[ ]'$ ; Sputum '[ ]'$ ; Snoring '[ ]'$   GI: Vomiting[ Y]; Dysphagia'[ ]'$ ; Melena'[ ]'$ ; Hematochezia '[ ]'$ ; Heartburn'[ ]'$ ; Abdominal pain '[ ]'$ ; Constipation '[ ]'$ ; Diarrhea '[ ]'$ ; BRBPR '[ ]'$  Has ileostomy  GU: Hematuria'[ ]'$ ; Dysuria '[ ]'$ ; Nocturia'[ ]'$   Vascular: Pain in legs with walking '[ ]'$ ; Pain in feet with lying flat '[ ]'$ ; Non-healing sores '[ ]'$ ; Stroke '[ ]'$ ; TIA '[ ]'$ ; Slurred speech '[ ]'$ ;  Neuro: Headaches'[ ]'$ ; Vertigo'[ ]'$ ; Seizures'[ ]'$ ; Paresthesias'[ ]'$ ;Blurred vision '[ ]'$ ; Diplopia '[ ]'$ ; Vision changes '[ ]'$   Ortho/Skin: Arthritis '[ ]'$ ; Joint pain '[ ]'$ ; Muscle pain '[ ]'$ ; Joint swelling '[ ]'$ ; Back Pain '[ ]'$ ; Rash '[ ]'$   Psych: Depression'[ ]'$ ; Anxiety'[ ]'$   Heme: Bleeding problems '[ ]'$ ; Clotting disorders '[ ]'$ ; Anemia '[ ]'$   Endocrine: Diabetes '[ ]'$ ; Thyroid dysfunction'[ ]'$   Home Medications Prior to Admission medications   Medication Sig Start Date End Date Taking? Authorizing Provider  atovaquone (MEPRON) 750 MG/5ML suspension Take 10 mLs (1,500 mg total) by mouth daily. 09/15/17   Earnstine Regal, PA-C  atovaquone [facility]) 750 MG/5ML suspension [facility] will adjust 09/15/17   Earnstine Regal, PA-C  carvedilol (COREG) 3.125 MG tablet [facility] will adjust 09/15/17   Earnstine Regal, PA-C  carvedilol (COREG) 6.25 MG tablet Take 12.5 mg by mouth 2 (two) times daily. 08/11/20   [provider]  citalopram  (CELEXA) 20 MG tablet [facility] will adjust 09/15/17   Earnstine Regal, PA-C  citalopram (CELEXA) 40 MG tablet Take 40 mg by mouth daily. 10/27/20   [provider]  docusate (COLACE) 50 MG/5ML liquid Place 10 mLs (100 mg total) into feeding tube 2 (two) times daily as needed for mild constipation. 09/15/17   Earnstine Regal, PA-C  enoxaparin (LOVENOX) 40 MG/0.4ML injection Inject 0.4 mLs (40 mg total) into the skin daily. 09/16/17   Earnstine Regal, PA-C  famotidine (PEPCID) 20-0.9 MG/50ML-% Inject 50 mLs (20 mg total) into the vein daily. 09/16/17   Earnstine Regal, PA-C  fentaNYL (SUBLIMAZE) SOLN Inject 50 mcg into the vein every hour as needed. 09/15/17   Earnstine Regal, PA-C  fentaNYL 10 mcg/ml SOLN infusion Inject 25-400 mcg/hr into the vein continuous. 09/15/17   Earnstine Regal, PA-C  furosemide (LASIX) 40 MG tablet Take 1 tablet (40 mg total) by mouth daily as needed (weight gain or swelling). Patient taking differently: Take 40 mg by mouth daily.  07/24/16   Kenzel Ruesch, Shaune Pascal, MD  ganciclovir in sodium chloride 0.9 % 100 mL Inject 176.5 mg into the vein 2 (two) times daily. With sodium chloride 0.9% SolP 100 mL    [provider]  meropenem 2 g in sodium chloride 0.9 % 100 mL Inject 2 g into the vein every 12 (twelve) hours. 09/15/17   Earnstine Regal, PA-C  metroNIDAZOLE (FLAGYL) 5-0.79 MG/ML-% IVPB Inject 100 mLs (500 mg total) into the vein every 8 (eight) hours. 09/15/17   Earnstine Regal, PA-C  mycophenolate (CELLCEPT) 500 MG tablet Take 500 mg by mouth 2 (two) times daily. 10/29/20   [provider]  pantoprazole (PROTONIX) 40 MG tablet [facility] will adjust 09/15/17   Earnstine Regal, PA-C  pravastatin (PRAVACHOL) 40 MG tablet Take 40 mg by mouth daily. 08/25/17   [provider]  predniSONE (DELTASONE) 10 MG tablet [facility] will adjust 09/15/17   Earnstine Regal, PA-C  predniSONE (DELTASONE) 5 MG tablet Take 5 mg by mouth daily. 09/02/20   [provider]  predniSONE 5 MG/5ML solution Take 10 mLs (10 mg total) by mouth daily with breakfast. 09/15/17   Earnstine Regal, PA-C  propofol (DIPRIVAN) 1000 MG/100ML EMUL injection Inject 372-3,720 mcg/min into the vein continuous. 09/15/17   Earnstine Regal, PA-C  sodium chloride 0.9 % infusion Inject 75 mLs into the vein continuous. 09/15/17   Earnstine Regal, PA-C  tacrolimus (PROGRAF) 1 MG capsule [facility] will adjust 09/15/17   Earnstine Regal, PA-C    Past Medical History: Past Medical History:  Diagnosis Date   AICD (automatic cardioverter/defibrillator) present    AKI (acute kidney injury) [facility])    Atrial fibrillation -parosysmal     Rx w amiodarone   Automatic implantable cardiac defibrillator -BSX    single chamber   CHF (congestive heart failure) ([place])    Chronic systolic heart failure ([place])    secondary to  nonischemic cardiomyopathy (EF 25-3%)   Elevated LFTs    GI bleed -massive    11 Units 2012   Gout    H/O hyperthyroidism    Hypertension    Noncompliance    H/O  MEDICAL NONCOMPLIANCE   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       Polymorphic ventricular tachycardia (HCC)    RECURRENT WITH APPROPRIATE SHOCK THERAPY IN THE PAST   RA (rheumatoid arthritis) ([place])    Severe mitral regurgitation    Tricuspid valve regurgitation    SEVERE   Ventricular fibrillation ([employer])    WITH APPROPRIATE SHOCK THERAPY IN THE PAST    Past Surgical History: Past Surgical History:  Procedure Laterality Date   APPLICATION OF A-CELL OF EXTREMITY N/A 09/05/2015   Procedure: APPLICATION OF A-CELL OF sternum;  Surgeon: Loel Lofty Dillingham, DO;  Location: [place];  Service: Plastics;  Laterality: N/A;   APPLICATION OF WOUND VAC N/A 08/27/2015   Procedure: APPLICATION OF WOUND VAC;  Surgeon: Ivin Poot, MD;  Location: [place];  Service: Thoracic;  Laterality: N/A;   APPLICATION OF WOUND VAC N/A 08/29/2015   Procedure: WOUND VAC CHANGE;  Surgeon: Ivin Poot, MD;  Location: [place];  Service: Thoracic;  Laterality: N/A;   APPLICATION OF WOUND VAC N/A 09/05/2015   Procedure: APPLICATION OF WOUND VAC to sternum;  Surgeon: Loel Lofty Dillingham, DO;  Location: [place];  Service: Plastics;  Laterality: N/A;   CARDIAC CATHETERIZATION  06/2006   RIGHT HEART CATH SHOWING SEVERE BIVENTRICUALR CHF WITH MARKED FILLING AND PRESSURES   CARDIAC CATHETERIZATION N/A 12/14/2014   Procedure: Right Heart Cath;  Surgeon: Jolaine Artist, MD;  Location: [place] CV LAB;  Service: Cardiovascular;  Laterality: N/A;   CARDIAC CATHETERIZATION N/A 12/14/2014   Procedure: IABP Insertion;  Surgeon: Jolaine Artist, MD;  Location: [place] CV LAB;  Service: Cardiovascular;  Laterality: N/A;   CARDIAC CATHETERIZATION N/A 12/18/2015   Procedure: Right Heart Cath;  Surgeon: Jolaine Artist, MD;  Location: [place] CV LAB;  Service: Cardiovascular;  Laterality: N/A;   CHOLECYSTECTOMY     ESOPHAGOGASTRODUODENOSCOPY N/A 09/17/2015   Procedure: ESOPHAGOGASTRODUODENOSCOPY (EGD);  Surgeon: Carol [place], MD;  Location: [place];  Service: Gastroenterology;  Laterality: N/A;   HEART TRANSPLANT     ILEOSTOMY N/A 09/15/2017   Procedure: ILEOSTOMY;  Surgeon: Coralie Keens, MD;  Location: [place];  Service: General;  Laterality: N/A;   IMPLANTABLE CARDIOVERTER DEFIBRILLATOR GENERATOR CHANGE N/A 07/01/2011   Procedure: IMPLANTABLE CARDIOVERTER DEFIBRILLATOR GENERATOR CHANGE;  Surgeon: Deboraha Sprang, MD;  Location: [facility] CATH LAB;  Service: Cardiovascular;  Laterality: N/A;   INCISION AND DRAINAGE OF WOUND N/A 09/05/2015   Procedure: IRRIGATION AND DEBRIDEMENT sternal WOUND;  Surgeon: Loel Lofty Dillingham, DO;  Location: [place];  Service: Plastics;  Laterality: N/A;   INSERT / REPLACE / [place] HE ICD MODEL 2180, SERIAL # [id number]   INSERTION OF DIALYSIS CATHETER Right 09/05/2015   Procedure: INSERTION OF DIALYSIS CATHETER;RIGHT SUBCLAVIAN;  Surgeon: Loel Lofty  Dillingham, DO;  Location: [place];  Service: Plastics;  Laterality: Right;   INSERTION OF IMPLANTABLE LEFT VENTRICULAR ASSIST DEVICE N/A 12/20/2014   Procedure: INSERTION OF IMPLANTABLE LEFT VENTRICULAR ASSIST DEVICE;  Surgeon: Ivin Poot, MD;  Location: [place];  Service: Open Heart Surgery;  Laterality: N/A;  CIRC ARREST  NITRIC OXIDE   LAPAROTOMY N/A 09/15/2017   Procedure: EXPLORATORY LAPAROTOMY; RIGHT HEMICOLECTOMY;  Surgeon: Coralie Keens, MD;  Location: [place];  Service: General;  Laterality: N/A;   PECTORALIS FLAP N/A 09/09/2015   Procedure: DEBRIDEMENT AND CLOSURE WOUND WITH PLACEMENT OF ABRA CLOSURE DEVICE;  Surgeon: Loel Lofty Dillingham, DO;  Location: [place];  Service: Plastics;  Laterality: N/A;   STERNAL CLOSURE N/A 09/17/2015   Procedure: ABDOMINAL WOUND CLOSURE WITH INCISIONAL VAC APPLICATION;  Surgeon: Ivin Poot, MD;  Location: [place];  Service: Thoracic;  Laterality: N/A;   STERNAL WOUND DEBRIDEMENT N/A 08/27/2015   Procedure: WOUND IRRIGATION AND DEBRIDEMENT;  Surgeon: Ivin Poot, MD;  Location: [place];  Service: Thoracic;  Laterality: N/A;   STERNAL WOUND DEBRIDEMENT N/A 08/29/2015   Procedure: DEBRIDEMENT OF chest wound;  Surgeon: Ivin Poot, MD;  Location: [place];  Service: Thoracic;  Laterality: N/A;   TEE WITHOUT CARDIOVERSION N/A 12/12/2014   Procedure: TRANSESOPHAGEAL ECHOCARDIOGRAM (TEE);  Surgeon: Jerline Pain, MD;  Location: [place];  Service: Cardiovascular;  Laterality: N/A;   TEE WITHOUT CARDIOVERSION N/A 12/20/2014   Procedure: TRANSESOPHAGEAL ECHOCARDIOGRAM (TEE);  Surgeon: Ivin Poot, MD;  Location: [place];  Service: Open Heart Surgery;  Laterality: N/A;    Family History: Family History  Problem Relation Age of Onset   Heart failure Brother     Social History: Social History   Socioeconomic History   Marital status: Not on file    Spouse name: Not on file   Number of children: Not on file   Years of education: Not on file   Highest  education level: Not on file  Occupational History   Occupation: TAX COLLECTOR    Employer: [employer]  Tobacco Use   Smoking status: Never   Smokeless tobacco: Never  Vaping Use   Vaping Use: Never used  Substance and Sexual Activity   Alcohol use: No   Drug use: No   Sexual activity: Not on file  Other Topics Concern   Not on file  Social History Narrative   ** Merged History Encounter **       LIVES IN [place] SINGLE TOBACCO USE .Marland KitchenMarland KitchenNO ALCOHOL USE.Marland Kitchen NO REGULAR EXERCISE.Marland Kitchen NO DRUG USE... NO   Social Determinants of Radio broadcast assistant Strain: Not on file  Food Insecurity: Not on file  Transportation Needs: Not on file  Physical Activity: Not on file  Stress: Not on file  Social Connections: Not on file    Allergies:  Allergies  Allergen Reactions   Phytonadione Other (See Comments)    Patient has LVAD: please check with LVAD coordinator on call or LVAD MD on call before reversal of anticoagulation with vit k    Objective:    Vital Signs:   Temp:  [97.6 F (36.4 C)] 97.6 F (36.4 C) (07/19 1019) Pulse Rate:  [122-126] 122 (07/19 1126) Resp:  [16-24] 24 (07/19 1126) BP: (143-153)/(108-114) 153/114 (07/19 1126) SpO2:  [96 %-100 %] 100 % (07/19 1126)    Weight change: There were no vitals filed for this visit.  Intake/Output:  No intake or output data in the 24 hours ending 11/05/20 1130    Physical Exam    General:  Appears weak. No resp difficulty HEENT: normal Neck: supple. JVP flat . Carotids 2+ bilat; no bruits. No lymphadenopathy or thyromegaly appreciated. Cor: PMI nondisplaced. Tachy. Regular rate & rhythm. No rubs, gallops or murmurs. Lungs: clear Abdomen: soft, nontender, nondistended. No hepatosplenomegaly. No bruits or masses. Good bowel sounds. RLQ ileostomy with dark green liquid stool Extremities:  no cyanosis, clubbing, rash, edema Neuro: alert & orientedx3, cranial nerves grossly intact. moves all 4 extremities w/o  difficulty. Affect flat    Telemetry   Sinus Tach 120s.   EKG   Sinus Tach  Labs   Basic Metabolic Panel: No results for input(s): NA, K, CL, CO2, GLUCOSE, BUN, CREATININE, CALCIUM, MG, PHOS in the last 168 hours.  Liver Function Tests: No results for input(s): AST, ALT, ALKPHOS, BILITOT, PROT, ALBUMIN in the last 168 hours. No results for input(s): LIPASE, AMYLASE in the last 168 hours. No results for input(s): AMMONIA in the last 168 hours.  CBC: No results for input(s): WBC, NEUTROABS, HGB, HCT, MCV, PLT in the last 168 hours.  Cardiac Enzymes: No results for input(s): CKTOTAL, CKMB, CKMBINDEX, TROPONINI in the last 168 hours.  BNP: BNP (last 3 results) No results for input(s): BNP in the last 8760 hours.  ProBNP (last 3 results) No results for input(s): PROBNP in the last 8760 hours.   CBG: No results for input(s): GLUCAP in the last 168 hours.  Coagulation Studies: No results for input(s): LABPROT, INR in the last 72 hours.   Imaging   No results found.   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   Presented with N/V 72 hours and unable to meds.   Assessment/Plan   -Nausea/Vomiting/flu-like illness -> COVID + - CXR now.  -WBC 10/04/20 11.8 but has been on chronic steroids.  -Blood cultures x2, CBC now.  - Check lactic acid  -Appears dry.  Give 1 liter NS now  2. Sinus Tach  -Suspect in the setting of volume depletion.  -Giving 1 liter NS now.   3. S/P OHT [id number] - Chronic Systolic Heart Failure->>Most recent ECHO 09/2020 EF 40-45% which is similar to the one in December 2021.  - Check stat ECHO. WBC 11.8 at his office visit 10/04/20  -He looks dry. Getting fluids.  -Unable to take meds x 48 hours  -Check Tacrolimus level-- send out lab at [facility] and can  take 48 hours for result.   4. CKD Stage IV  Check BMET now.  Creatinine baseline 3.1   5. HTN  -Has been on carvedilol 12.5 mg twice a day but as above not able to take due to N/V - Will watch and may need IV med but will wait and see.   6. Diverticulosis Has S/P ileostomy 2020   We will contact Transplant Team at [facility].   - Addendum ---> creatinine 10.4. + COVID positive. Dr Haroldine Laws called Dr Mosetta Pigeon at [facility] and recommends transfer to [facility] ED for management.   Dr Haroldine Laws will contact Dr Letha Cape.    Length of Stay: 0  Darrick Grinder, NP  11/05/2020, 11:30 AM  Advanced Heart Failure Team Pager [phone] (M-F; 7a - 5p)  Please contact [facility] for night-coverage after hours (4p -7a ) and weekends on [email or web address]   50 y/o male with systolic HF due to severe NICM s/p OHTx in 2019, CKD 4 presents to ER with flu-like symptoms, severe fatigue and increased output from ileostomy. Unable to take meds x 48 hours   In ER found to be covid +. Serum creatinine 3.1 -> 10.4. Bun 158. K 5.4. Lactic acid 1.5. CXR and CT A/P ok.  Echo EF 45% septal hypokinesis. LV appears underfilled.  General:  Weak ill-appearing. No resp difficulty HEENT: normal Neck: supple. no JVD. Carotids 2+ bilat; no bruits. No lymphadenopathy or thryomegaly appreciated. Cor: PMI nondisplaced. Regular tachy No rubs, gallops or murmurs. Lungs: clear Abdomen: soft, nontender, nondistended. No hepatosplenomegaly. No bruits or masses. Good bowel sounds. Extremities: no cyanosis, clubbing, rash, edema Neuro: alert & orientedx3, cranial nerves grossly intact. moves all 4 extremities w/o difficulty. Affect pleasant  He is volume depleted with acute-on-chronic renal failure in setting of COVID infection. EF appears stable on ECHO. Will hydrate aggressively. Bcx drawn. D/w Dr. Mosetta Pigeon and planning transfer to [facility] for further management.   Glori Bickers, MD  3:35 PM

## 2020-11-05 NOTE — ED Provider Notes (Signed)
[facility] EMERGENCY DEPARTMENT Provider Note   CSN: [id number] Arrival date & time: 11/05/20  1003     History Chief Complaint  Patient presents with   Weakness    [name] is a 50 y.o. male.  HPI 50 year old male with a history of a heart transplant from [place] presents with vomiting and diarrhea.  He has been feeling weak and having vomiting/diarrhea for 2 days.  Also a little bit of a cough and mild dyspnea.  He is also having abdominal pain, especially near his ileostomy that started after the initial GI symptoms.  No fevers or chest pain.  He feels overall weak and has not been able to keep down his antirejection medicines.  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       Polymorphic ventricular tachycardia (HCC)    RECURRENT WITH APPROPRIATE SHOCK THERAPY IN THE PAST   RA (rheumatoid arthritis) ([place])    Severe mitral regurgitation    Tricuspid valve regurgitation    SEVERE   Ventricular fibrillation ([place])    WITH APPROPRIATE SHOCK THERAPY IN THE PAST    Patient Active Problem List   Diagnosis Date Noted   Perforated bowel ([place]) 09/15/2017   Status post exploratory laparotomy 09/15/2017   PAF (paroxysmal atrial fibrillation) ([place]) 11/18/2015   Wound infection    Debility 09/25/2015   Debilitated 0000000   Chronic systolic congestive heart failure ([place])    Colitis    Septic  shock ([place])    Enteritis due to Clostridium difficile    Staphylococcus aureus bacteremia    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[place])    Abscess of abdominal wall    Acute on chronic systolic (congestive) heart failure (HCC)    Left ventricular assist device (LVAD) complication [id number]   Epistaxis 05/21/2015   Situational depression 04/13/2015   Acute gout 01/15/2015   Palliative care encounter 12/17/2014   Cardiogenic shock (HCC)    Acute on chronic systolic CHF (congestive heart failure) ([place])    Acute on chronic renal failure (HCC)    Chronic atrial fibrillation ([place])    Low output heart failure ([place]) 12/14/2014   Acute abdominal pain    Near syncope    Hypotension 11/30/2014   AKI (acute kidney injury) ([place])    Abnormal LFTs 10/26/2012   Hyperthyroidism 05/26/2011   Ventricular tachycardia-polymorphic 11/19/2010   ICD-[employer] 07/22/2010   Nonischemic cardiomyopathy ([place]) 07/22/2010   Essential hypertension, benign [id number]   SYSTOLIC HEART FAILURE, CHRONIC 10/23/2008    Past Surgical History:  Procedure Laterality Date   APPLICATION OF A-CELL OF EXTREMITY N/A 09/05/2015   Procedure: APPLICATION OF A-CELL OF sternum;  Surgeon: Loel Lofty Dillingham, DO;  Location: [facility] OR;  Service: Plastics;  Laterality: N/A;   APPLICATION OF WOUND VAC N/A 08/27/2015   Procedure: APPLICATION OF WOUND  VAC;  Surgeon: Ivin Poot, MD;  Location: [facility] OR;  Service: Thoracic;  Laterality: N/A;   APPLICATION OF WOUND VAC N/A 08/29/2015   Procedure: WOUND VAC CHANGE;  Surgeon: Ivin Poot, MD;  Location: [place];  Service: Thoracic;  Laterality: N/A;   APPLICATION OF WOUND VAC N/A 09/05/2015   Procedure: APPLICATION OF WOUND VAC to sternum;  Surgeon: Loel Lofty Dillingham, DO;  Location: [place];  Service: Plastics;  Laterality: N/A;   CARDIAC CATHETERIZATION  06/2006   RIGHT HEART CATH SHOWING SEVERE BIVENTRICUALR CHF WITH MARKED FILLING AND PRESSURES   CARDIAC CATHETERIZATION  N/A 12/14/2014   Procedure: Right Heart Cath;  Surgeon: Jolaine Artist, MD;  Location: [place] CV LAB;  Service: Cardiovascular;  Laterality: N/A;   CARDIAC CATHETERIZATION N/A 12/14/2014   Procedure: IABP Insertion;  Surgeon: Jolaine Artist, MD;  Location: [place] CV LAB;  Service: Cardiovascular;  Laterality: N/A;   CARDIAC CATHETERIZATION N/A 12/18/2015   Procedure: Right Heart Cath;  Surgeon: Jolaine Artist, MD;  Location: [place] CV LAB;  Service: Cardiovascular;  Laterality: N/A;   CHOLECYSTECTOMY     ESOPHAGOGASTRODUODENOSCOPY N/A 09/17/2015   Procedure: ESOPHAGOGASTRODUODENOSCOPY (EGD);  Surgeon: Carol Ada, MD;  Location: [place];  Service: Gastroenterology;  Laterality: N/A;   HEART TRANSPLANT     ILEOSTOMY N/A 09/15/2017   Procedure: ILEOSTOMY;  Surgeon: Coralie Keens, MD;  Location: [place];  Service: General;  Laterality: N/A;   IMPLANTABLE CARDIOVERTER DEFIBRILLATOR GENERATOR CHANGE N/A 07/01/2011   Procedure: IMPLANTABLE CARDIOVERTER DEFIBRILLATOR GENERATOR CHANGE;  Surgeon: Deboraha Sprang, MD;  Location[facility] C[facility] LAB;  Service: Cardiovascular;  Laterality: N/A;   INCISION AND DRAINAGE OF WOUND N/A 09/05/2015   Procedure: IRRIGATION AND DEBRIDEMENT sternal WOUND;  Surgeon: Loel Lofty Dillingham, DO;  Location: [place];  Service: Plastics;  Laterality: N/A;   INSERT / REPLACE / [place] HE ICD MODEL 2180, SERIAL # [id number]   INSERTION OF DIALYSIS CATHETER Right 09/05/2015   Procedure: INSERTION OF DIALYSIS CATHETER;RIGHT SUBCLAVIAN;  Surgeon: Loel Lofty Dillingham, DO;  Location: [place];  Service: Plastics;  Laterality: Right;   INSERTION OF IMPLANTABLE LEFT VENTRICULAR ASSIST DEVICE N/A 12/20/2014   Procedure: INSERTION OF IMPLANTABLE LEFT VENTRICULAR ASSIST DEVICE;  Surgeon: Ivin Poot, MD;  Location: [place];  Service: Open Heart Surgery;  Laterality: N/A;  CIRC ARREST  NITRIC OXIDE   LAPAROTOMY N/A 09/15/2017   Procedure: EXPLORATORY LAPAROTOMY;  RIGHT HEMICOLECTOMY;  Surgeon: Coralie Keens, MD;  Location: [place];  Service: General;  Laterality: N/A;   PECTORALIS FLAP N/A 09/09/2015   Procedure: DEBRIDEMENT AND CLOSURE WOUND WITH PLACEMENT OF ABRA CLOSURE DEVICE;  Surgeon: Loel Lofty Dillingham, DO;  Location: [place];  Service: Plastics;  Laterality: N/A;   STERNAL CLOSURE N/A 09/17/2015   Procedure: ABDOMINAL WOUND CLOSURE WITH INCISIONAL VAC APPLICATION;  Surgeon: Ivin Poot, MD;  Location: [place];  Service: Thoracic;  Laterality: N/A;   STERNAL WOUND DEBRIDEMENT N/A 08/27/2015   Procedure: WOUND IRRIGATION AND DEBRIDEMENT;  Surgeon: Ivin Poot, MD;  Location: [place];  Service: Thoracic;  Laterality: N/A;   STERNAL WOUND DEBRIDEMENT N/A 08/29/2015   Procedure: DEBRIDEMENT OF chest wound;  Surgeon: Ivin Poot, MD;  Location: [place];  Service: Thoracic;  Laterality: N/A;   TEE WITHOUT CARDIOVERSION N/A 12/12/2014   Procedure: TRANSESOPHAGEAL ECHOCARDIOGRAM (TEE);  Surgeon: Jerline Pain, MD;  Location: [place];  Service: Cardiovascular;  Laterality: N/A;  TEE WITHOUT CARDIOVERSION N/A 12/20/2014   Procedure: TRANSESOPHAGEAL ECHOCARDIOGRAM (TEE);  Surgeon: Ivin Poot, MD;  Location: [place];  Service: Open Heart Surgery;  Laterality: N/A;       Family History  Problem Relation Age of Onset   Heart failure Brother     Social History   Tobacco Use   Smoking status: Never   Smokeless tobacco: Never  Vaping Use   Vaping Use: Never used  Substance Use Topics   Alcohol use: No   Drug use: No    Home Medications Prior to Admission medications   Medication Sig Start Date End Date Taking? Authorizing Provider  atovaquone (MEPRON) 750 MG/5ML suspension Take 10 mLs (1,500 mg total) by mouth daily. 09/15/17   Earnstine Regal, PA-C  atovaquone [facility]) 750 MG/5ML suspension [facility] will adjust 09/15/17   Earnstine Regal, PA-C  carvedilol (COREG) 3.125 MG tablet [facility] will adjust 09/15/17   Earnstine Regal, PA-C   carvedilol (COREG) 6.25 MG tablet Take 12.5 mg by mouth 2 (two) times daily. 08/11/20   [provider]  citalopram (CELEXA) 20 MG tablet [facility] will adjust 09/15/17   Earnstine Regal, PA-C  citalopram (CELEXA) 40 MG tablet Take 40 mg by mouth daily. 10/27/20   [provider]  docusate (COLACE) 50 MG/5ML liquid Place 10 mLs (100 mg total) into feeding tube 2 (two) times daily as needed for mild constipation. 09/15/17   Earnstine Regal, PA-C  enoxaparin (LOVENOX) 40 MG/0.4ML injection Inject 0.4 mLs (40 mg total) into the skin daily. 09/16/17   Earnstine Regal, PA-C  famotidine (PEPCID) 20-0.9 MG/50ML-% Inject 50 mLs (20 mg total) into the vein daily. 09/16/17   Earnstine Regal, PA-C  fentaNYL (SUBLIMAZE) SOLN Inject 50 mcg into the vein every hour as needed. 09/15/17   Earnstine Regal, PA-C  fentaNYL 10 mcg/ml SOLN infusion Inject 25-400 mcg/hr into the vein continuous. 09/15/17   Earnstine Regal, PA-C  furosemide (LASIX) 40 MG tablet Take 1 tablet (40 mg total) by mouth daily as needed (weight gain or swelling). Patient taking differently: Take 40 mg by mouth daily.  07/24/16   [name], Shaune Pascal, MD  ganciclovir in sodium chloride 0.9 % 100 mL Inject 176.5 mg into the vein 2 (two) times daily. With sodium chloride 0.9% SolP 100 mL    [provider]  meropenem 2 g in sodium chloride 0.9 % 100 mL Inject 2 g into the vein every 12 (twelve) hours. 09/15/17   Earnstine Regal, PA-C  metroNIDAZOLE (FLAGYL) 5-0.79 MG/ML-% IVPB Inject 100 mLs (500 mg total) into the vein every 8 (eight) hours. 09/15/17   Earnstine Regal, PA-C  mycophenolate (CELLCEPT) 500 MG tablet Take 500 mg by mouth 2 (two) times daily. 10/29/20   [provider]  pantoprazole (PROTONIX) 40 MG tablet [facility] will adjust 09/15/17   Earnstine Regal, PA-C  pravastatin (PRAVACHOL) 40 MG tablet Take 40 mg by mouth daily. 08/25/17   [provider]  predniSONE (DELTASONE) 10 MG tablet [facility] will  adjust 09/15/17   Earnstine Regal, PA-C  predniSONE (DELTASONE) 5 MG tablet Take 5 mg by mouth daily. 09/02/20   [provider]  predniSONE 5 MG/5ML solution Take 10 mLs (10 mg total) by mouth daily with breakfast. 09/15/17   Earnstine Regal, PA-C  propofol (DIPRIVAN) 1000 MG/100ML EMUL injection Inject 372-3,720 mcg/min into the vein continuous. 09/15/17   Earnstine Regal, PA-C  sodium chloride 0.9 % infusion Inject 75 mLs into the vein continuous. 09/15/17   Earnstine Regal, PA-C  tacrolimus (PROGRAF) 1 MG capsule [facility] will adjust 09/15/17   Earnstine Regal, PA-C    Allergies    Phytonadione  Review of Systems   Review of Systems  Constitutional:  Negative for fever.  Respiratory:  Positive for cough and shortness of breath.   Cardiovascular:  Negative for chest pain.  Gastrointestinal:  Positive for abdominal pain, diarrhea and vomiting. Negative for blood in stool.  Neurological:  Positive for weakness.  All other systems reviewed and are negative.  Physical Exam Updated Vital Signs BP (!) 155/117   Pulse (!) 116   Temp 97.6 F (36.4 C) (Oral)   Resp 18   SpO2 99%   Physical Exam Vitals and nursing note reviewed.  Constitutional:      Appearance: He is well-developed. He is ill-appearing.  HENT:     Head: Normocephalic and atraumatic.     Right Ear: External ear normal.     Left Ear: External ear normal.     Nose: Nose normal.  Eyes:     General:        Right eye: No discharge.        Left eye: No discharge.  Cardiovascular:     Rate and Rhythm: Regular rhythm. Tachycardia present.     Heart sounds: Normal heart sounds.  Pulmonary:     Effort: Pulmonary effort is normal.     Breath sounds: Normal breath sounds.  Abdominal:     Palpations: Abdomen is soft.     Tenderness: There is abdominal tenderness in the right upper quadrant, right lower quadrant and left upper quadrant.    Musculoskeletal:     Cervical back: Neck supple.  Skin:    General:  Skin is warm and dry.  Neurological:     Mental Status: He is alert.  Psychiatric:        Mood and Affect: Mood is not anxious.    ED Results / Procedures / Treatments   Labs (all labs ordered are listed, but only abnormal results are displayed) Labs Reviewed  RESP PANEL BY RT-PCR (FLU A&B, COVID) ARPGX2 - Abnormal; Notable for the following components:      Result Value   SARS Coronavirus 2 by RT PCR POSITIVE (*)    All other components within normal limits  COMPREHENSIVE METABOLIC PANEL - Abnormal; Notable for the following components:   Potassium 5.4 (*)    CO2 7 (*)    Glucose, Bld 113 (*)    BUN 158 (*)    Creatinine, Ser 10.42 (*)    Total Protein 8.4 (*)    Alkaline Phosphatase 154 (*)    GFR, Estimated 5 (*)    Anion gap 25 (*)    All other components within normal limits  LIPASE, BLOOD - Abnormal; Notable for the following components:   Lipase 84 (*)    All other components within normal limits  CBC WITH DIFFERENTIAL/PLATELET - Abnormal; Notable for the following components:   WBC 11.9 (*)    RBC 6.83 (*)    HCT 52.8 (*)    MCV 77.3 (*)    MCH 22.5 (*)    MCHC 29.2 (*)    RDW 18.6 (*)    nRBC 0.3 (*)    Neutro Abs 9.2 (*)    Monocytes Absolute 1.8 (*)    Abs Immature Granulocytes 0.17 (*)    All other components within normal limits  CULTURE, BLOOD (ROUTINE X 2)  CULTURE, BLOOD (ROUTINE X 2)  LACTIC ACID, PLASMA  LACTIC ACID, PLASMA  TACROLIMUS LEVEL  CMV IGM  CMV ANTIBODY, IGG (EIA)  URINALYSIS, ROUTINE W REFLEX MICROSCOPIC  I-STAT CHEM 8, ED  I-STAT BETA HCG BLOOD, ED ([facility], [facility], [facility] ONLY)    EKG EKG Interpretation  Date/Time:  Tuesday November 05 2020 10:48:11 EDT Ventricular Rate:  122 PR Interval:  104 QRS Duration: 128 QT Interval:  450 QTC Calculation: 642 R Axis:   -82 Text Interpretation: Sinus or ectopic atrial tachycardia RBBB and LAFB Left ventricular hypertrophy Abnrm T, consider ischemia, anterolateral lds Confirmed by Sherwood Gambler  [id number]) on 11/05/2020 1:20:20 PM  Radiology CT ABDOMEN PELVIS WO CONTRAST  Result Date: 11/05/2020 CLINICAL DATA:  Nausea and vomiting for 2 days EXAM: CT ABDOMEN AND PELVIS WITHOUT CONTRAST TECHNIQUE: Multidetector CT imaging of the abdomen and pelvis was performed following the standard protocol without IV contrast. COMPARISON:  09/15/2017 FINDINGS: Lower chest: Lung bases demonstrate mild scarring without focal infiltrate or sizable effusion. Hepatobiliary: Gallbladder has been surgically removed. Liver is within normal limits. Pancreas: Unremarkable. No pancreatic ductal dilatation or surrounding inflammatory changes. Spleen: Normal in size without focal abnormality. Adrenals/Urinary Tract: Adrenal glands are within normal limits. Kidneys demonstrate no renal calculi or obstructive changes. The ureters are within normal limits. The bladder is partially distended. Stomach/Bowel: Colon is decompressed but demonstrates some mild diverticular change. There are changes consistent with right hemicolectomy and the transverse colon stump is noted in the mid abdomen. Right-sided ileostomy is seen with some herniation of adjacent bowel loops through the abdominal wall defect. No obstructive changes are seen. The stomach is within normal limits. Vascular/Lymphatic: Aortic atherosclerosis. No enlarged abdominal or pelvic lymph nodes. Reproductive: Prostate is unremarkable. Other: No abdominal wall hernia or abnormality. No abdominopelvic ascites. Musculoskeletal: No acute or significant osseous findings. IMPRESSION: Changes consistent with right-sided ileostomy with herniated small bowel loops through the abdominal wall defect. No obstructive changes are seen. No other focal abnormality is noted. Electronically Signed   By: Inez Catalina M.D.   On: 11/05/2020 15:00   DG Chest Portable 1 View  Result Date: 11/05/2020 CLINICAL DATA:  Cough, vomiting EXAM: PORTABLE CHEST 1 VIEW COMPARISON:  09/15/2017 chest radiograph.  FINDINGS: New discontinuity in the superior most sternotomy wire. Otherwise intact sternotomy wires. Stable cardiomediastinal silhouette with mild cardiomegaly. No pneumothorax. No pleural effusion. Lungs appear clear, with no acute consolidative airspace disease and no pulmonary edema. Retained pacer leads overlie the left chest, unchanged. IMPRESSION: Stable mild cardiomegaly without pulmonary edema. No active pulmonary disease. Electronically Signed   By: Ilona Sorrel M.D.   On: 11/05/2020 11:34    Procedures .Critical Care  Date/Time: 11/05/2020 3:26 PM Performed by: Sherwood Gambler, MD Authorized by: Sherwood Gambler, MD   Critical care provider statement:    Critical care time (minutes):  45   Critical care time was exclusive of:  Separately billable procedures and treating other patients   Critical care was necessary to treat or prevent imminent or life-threatening deterioration of the following conditions:  Renal failure   Critical care was time spent personally by me on the following activities:  Discussions with consultants, evaluation of patient's response to treatment, examination of patient, ordering and performing treatments and interventions, ordering and review of laboratory studies, ordering and review of radiographic studies, pulse oximetry, re-evaluation of patient's condition, obtaining history from patient or surrogate and review of old charts   Medications Ordered in ED Medications  lactated ringers infusion ( Intravenous New Bag/Given 11/05/20 1510)  sodium chloride 0.9 %  bolus 1,000 mL (0 mLs Intravenous Stopped 11/05/20 1321)  ondansetron (ZOFRAN) injection 4 mg (4 mg Intravenous Given 11/05/20 1126)  fentaNYL (SUBLIMAZE) injection 50 mcg (50 mcg Intravenous Given 11/05/20 1126)  lactated ringers bolus 1,000 mL (0 mLs Intravenous Stopped 11/05/20 1502)    ED Course  I have reviewed the triage vital signs and the nursing notes.  Pertinent labs & imaging results that were  available during my care of the patient were reviewed by me and considered in my medical decision making (see chart for details).    MDM Rules/Calculators/A&P                          Patient is found to have covid 19. He is not hypoxic and CXR is benign. However he has significant renal failure with new creatining of 10 and BUN of 150. He is not altered. Given this with abd pain, CT obtained but is benign. He was give 2L IVF. He last urinated yesterday. At this point there is no indication for antibiotics. I discussed with [facility] transplant team, Dr. Mosetta Pigeon, who accepts in transfer to [facility].  Patient's HR has improved. No sepsis. Stable for transfer.  Final Clinical Impression(s) / ED Diagnoses Final diagnoses:  COVID-19  Acute kidney injury superimposed on chronic kidney disease [facility])    Rx / DC Orders ED Discharge Orders     None        Sherwood Gambler, MD 11/05/20 1547

## 2020-11-05 NOTE — ED Notes (Addendum)
Called [facility] to get transportation for patient to [place]. [facility] states that they do not have a truck and stated that we needed to call [place].  Called [employer] and they gave the number to [employer]. [phone].  [facility] does not offer BLS transport. Gave the number to [facility], [phone].  They state that they will need to get a pre-approval from the patients insurance, however they should be able to pick him up at 1:30am.  Pt going to room 7319, Dr. Mosetta Pigeon.

## 2020-11-05 NOTE — Progress Notes (Signed)
*  PRELIMINARY RESULTS* Echocardiogram 2D Echocardiogram has been performed.  Luisa Hart RDCS 11/05/2020, 3:13 PM

## 2020-11-05 NOTE — Progress Notes (Signed)
Pt in for sick visit, n/v/d for several days, pt extremely weak, HR 127, pt taken to ER for further eval per Dr Haroldine Laws

## 2020-11-06 LAB — CMV IGM: CMV IgM: <30 AU/mL (ref 0.0–29.9)

## 2020-11-06 LAB — CMV ANTIBODY, IGG (EIA): CMV Ab - IgG: >10 U/mL — ABNORMAL HIGH (ref 0.00–0.59)

## 2020-11-07 LAB — TACROLIMUS LEVEL: Tacrolimus (FK506) - LabCorp: 13.4 ng/mL (ref 2.0–20.0)

## 2020-11-10 LAB — CULTURE, BLOOD (ROUTINE X 2)
Culture: NO GROWTH
Culture: NO GROWTH

## 2020-11-18 ENCOUNTER — Other Ambulatory Visit (HOSPITAL_COMMUNITY): Payer: Self-pay | Admitting: Cardiology

## 2020-11-18 DIAGNOSIS — I5022 Chronic systolic (congestive) heart failure: Secondary | ICD-10-CM

## 2020-11-18 DIAGNOSIS — Z941 Heart transplant status: Secondary | ICD-10-CM

## 2020-11-18 NOTE — Progress Notes (Signed)
Discharge summary reviewed Orders placed-pt to bring orders as well to upcoming lab appt to confirm

## 2020-11-19 ENCOUNTER — Other Ambulatory Visit: Payer: Self-pay

## 2020-11-19 ENCOUNTER — Ambulatory Visit (HOSPITAL_COMMUNITY)
Admission: RE | Admit: 2020-11-19 | Discharge: 2020-11-19 | Disposition: A | Discharge status: Home / Self Care | Payer: Medicare Other | Source: Ambulatory Visit | Attending: Internal Medicine | Admitting: Internal Medicine

## 2020-11-19 DIAGNOSIS — Z941 Heart transplant status: Secondary | ICD-10-CM | POA: Diagnosis not present

## 2020-11-19 DIAGNOSIS — I5022 Chronic systolic (congestive) heart failure: Secondary | ICD-10-CM | POA: Insufficient documentation

## 2020-11-19 LAB — RENAL FUNCTION PANEL
Albumin: 3 g/dL — ABNORMAL LOW (ref 3.5–5.0)
Anion gap: 7 (ref 5–15)
BUN: 45 mg/dL — ABNORMAL HIGH (ref 6–20)
CO2: 20 mmol/L — ABNORMAL LOW (ref 22–32)
Calcium: 8.9 mg/dL (ref 8.9–10.3)
Chloride: 113 mmol/L — ABNORMAL HIGH (ref 98–111)
Creatinine, Ser: 3.89 mg/dL — ABNORMAL HIGH (ref 0.61–1.24)
GFR, Estimated: 18 mL/min — ABNORMAL LOW (ref >60)
Glucose, Bld: 92 mg/dL (ref 70–99)
Phosphorus: 2.3 mg/dL — ABNORMAL LOW (ref 2.5–4.6)
Potassium: 5.1 mmol/L (ref 3.5–5.1)
Sodium: 140 mmol/L (ref 135–145)

## 2020-11-19 LAB — CBC
HCT: 36.2 % — ABNORMAL LOW (ref 39.0–52.0)
Hemoglobin: 10.2 g/dL — ABNORMAL LOW (ref 13.0–17.0)
MCH: 22.4 pg — ABNORMAL LOW (ref 26.0–34.0)
MCHC: 28.2 g/dL — ABNORMAL LOW (ref 30.0–36.0)
MCV: 79.6 fL — ABNORMAL LOW (ref 80.0–100.0)
Platelets: 158 10*3/uL (ref 150–400)
RBC: 4.55 MIL/uL (ref 4.22–5.81)
RDW: 17.6 % — ABNORMAL HIGH (ref 11.5–15.5)
WBC: 6 10*3/uL (ref 4.0–10.5)
nRBC: 0.3 % — ABNORMAL HIGH (ref 0.0–0.2)

## 2020-11-21 LAB — TACROLIMUS LEVEL: Tacrolimus (FK506) - LabCorp: 5.3 ng/mL (ref 2.0–20.0)

## 2021-04-25 ENCOUNTER — Other Ambulatory Visit: Payer: Self-pay | Admitting: Nephrology

## 2021-04-25 DIAGNOSIS — N184 Chronic kidney disease, stage 4 (severe): Secondary | ICD-10-CM

## 2021-05-02 ENCOUNTER — Other Ambulatory Visit: Payer: Medicare Other

## 2021-05-12 ENCOUNTER — Other Ambulatory Visit: Payer: Self-pay

## 2021-05-12 ENCOUNTER — Ambulatory Visit
Admission: RE | Admit: 2021-05-12 | Discharge: 2021-05-12 | Disposition: A | Discharge status: Home / Self Care | Payer: Medicare Other | Source: Ambulatory Visit | Attending: Nephrology | Admitting: Nephrology

## 2021-05-12 DIAGNOSIS — N179 Acute kidney failure, unspecified: Secondary | ICD-10-CM

## 2021-05-12 DIAGNOSIS — N184 Chronic kidney disease, stage 4 (severe): Secondary | ICD-10-CM

## 2021-05-21 NOTE — Progress Notes (Signed)
Office Note     CC:  ESRD Requesting Provider:  Sandi Mariscal, MD  HPI: [name] is a Right handed 51 y.o. (1970/10/06) male with kidney disease who presents at the request of Sandi Mariscal, MD for permanent HD access. Other medical history includes CKD Stage 4, diverticulitis requiring ileostomy 2020,  HTN, had LVAD then later had OHT 04/2017 at [facility].    The patient has had no prior access procedures. Not currently on dialysis.  On exam, [name] was doing well.  Originally from [place], he is living [place] for a number of years.  His sisters are in [place], and have been very active in his care.  MD no was aware that he would need dialysis, but had not discussed fistula creation.  The pt is not on a statin for cholesterol management.  The pt is not on a daily aspirin.   Other AC:  - The pt is not on medications for hypertension.   The pt is not diabetic. 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       Polymorphic ventricular tachycardia (HCC)    RECURRENT WITH APPROPRIATE SHOCK THERAPY IN THE PAST   RA (rheumatoid arthritis) ([place])    Severe mitral regurgitation    Tricuspid valve regurgitation    SEVERE   Ventricular fibrillation ([place])    WITH APPROPRIATE SHOCK THERAPY IN THE PAST    Past Surgical History:  Procedure Laterality Date   APPLICATION OF A-CELL OF EXTREMITY N/A 09/05/2015    Procedure: APPLICATION OF A-CELL OF sternum;  Surgeon: Loel Lofty Dillingham, DO;  Location: [place];  Service: Plastics;  Laterality: N/A;   APPLICATION OF WOUND VAC N/A 08/27/2015   Procedure: APPLICATION OF WOUND VAC;  Surgeon: Ivin Poot, MD;  Location: [place];  Service: Thoracic;  Laterality: N/A;   APPLICATION OF WOUND VAC N/A 08/29/2015   Procedure: WOUND VAC CHANGE;  Surgeon: Ivin Poot, MD;  Location: [place];  Service: Thoracic;  Laterality: N/A;   APPLICATION OF WOUND VAC N/A 09/05/2015   Procedure: APPLICATION OF WOUND VAC to sternum;  Surgeon: Loel Lofty Dillingham, DO;  Location: [place];  Service: Plastics;  Laterality: N/A;   CARDIAC CATHETERIZATION  06/2006   RIGHT HEART CATH SHOWING SEVERE BIVENTRICUALR CHF WITH MARKED FILLING AND PRESSURES   CARDIAC CATHETERIZATION N/A 12/14/2014   Procedure: Right Heart Cath;  Surgeon: Jolaine Artist, MD;  Location: [place] CV [facility];  Service: Cardiovascular;  Laterality: N/A;   CARDIAC CATHETERIZATION N/A 12/14/2014   Procedure: IABP Insertion;  Surgeon: Jolaine Artist, MD;  Location: [place] [facility];  Service: Cardiovascular;  Laterality: N/A;   CARDIAC CATHETERIZATION N/A 12/18/2015   Procedure: Right Heart Cath;  Surgeon: Jolaine Artist, MD;  Location: [place] [facility];  Service: Cardiovascular;  Laterality: N/A;   CHOLECYSTECTOMY     ESOPHAGOGASTRODUODENOSCOPY N/A 09/17/2015   Procedure: ESOPHAGOGASTRODUODENOSCOPY (EGD);  Surgeon: Carol Ada, MD;  Location: [place];  Service: Gastroenterology;  Laterality: N/A;   HEART TRANSPLANT     ILEOSTOMY N/A 09/15/2017   Procedure: ILEOSTOMY;  Surgeon: Coralie Keens, MD;  Location: [place];  Service: General;  Laterality: N/A;   IMPLANTABLE CARDIOVERTER DEFIBRILLATOR GENERATOR CHANGE N/A 07/01/2011   Procedure: IMPLANTABLE CARDIOVERTER DEFIBRILLATOR GENERATOR CHANGE;  Surgeon: Deboraha Sprang, MD;  Location: [facility] C[facility];  Service: Cardiovascular;  Laterality: N/A;   INCISION AND DRAINAGE  OF WOUND N/A 09/05/2015   Procedure: IRRIGATION AND DEBRIDEMENT sternal WOUND;  Surgeon: Loel Lofty Dillingham, DO;  Location: [place];  Service: Plastics;  Laterality: N/A;   INSERT / REPLACE / [place] [facility] ICD MODEL 2180, SERIAL # [id number]   INSERTION OF DIALYSIS CATHETER Right 09/05/2015   Procedure: INSERTION OF DIALYSIS CATHETER;RIGHT SUBCLAVIAN;  Surgeon: Loel Lofty Dillingham, DO;  Location: [place];  Service: Plastics;  Laterality: Right;   INSERTION OF IMPLANTABLE LEFT VENTRICULAR ASSIST DEVICE N/A 12/20/2014   Procedure: INSERTION OF IMPLANTABLE LEFT VENTRICULAR ASSIST DEVICE;  Surgeon: Ivin Poot, MD;  Location: [place];  Service: Open Heart Surgery;  Laterality: N/A;  CIRC ARREST  NITRIC OXIDE   LAPAROTOMY N/A 09/15/2017   Procedure: EXPLORATORY LAPAROTOMY; RIGHT HEMICOLECTOMY;  Surgeon: Coralie Keens, MD;  Location: [place];  Service: General;  Laterality: N/A;   PECTORALIS FLAP N/A 09/09/2015   Procedure: DEBRIDEMENT AND CLOSURE WOUND WITH PLACEMENT OF ABRA CLOSURE DEVICE;  Surgeon: Loel Lofty Dillingham, DO;  Location: [place];  Service: Plastics;  Laterality: N/A;   STERNAL CLOSURE N/A 09/17/2015   Procedure: ABDOMINAL WOUND CLOSURE WITH INCISIONAL VAC APPLICATION;  Surgeon: Ivin Poot, MD;  Location: [place];  Service: Thoracic;  Laterality: N/A;   STERNAL WOUND DEBRIDEMENT N/A 08/27/2015   Procedure: WOUND IRRIGATION AND DEBRIDEMENT;  Surgeon: Ivin Poot, MD;  Location: [place];  Service: Thoracic;  Laterality: N/A;   STERNAL WOUND DEBRIDEMENT N/A 08/29/2015   Procedure: DEBRIDEMENT OF chest wound;  Surgeon: Ivin Poot, MD;  Location: [place];  Service: Thoracic;  Laterality: N/A;   TEE WITHOUT CARDIOVERSION N/A 12/12/2014   Procedure: TRANSESOPHAGEAL ECHOCARDIOGRAM (TEE);  Surgeon: Jerline Pain, MD;  Location: [place];  Service: Cardiovascular;  Laterality: N/A;   TEE WITHOUT CARDIOVERSION N/A 12/20/2014   Procedure: TRANSESOPHAGEAL ECHOCARDIOGRAM (TEE);   Surgeon: Ivin Poot, MD;  Location: [place];  Service: Open Heart Surgery;  Laterality: N/A;    Social History   Socioeconomic History   Marital status: Single    Spouse name: Not on file   Number of children: Not on file   Years of education: Not on file   Highest education level: Not on file  Occupational History   Occupation: TAX COLLECTOR    Employer: [employer]  Tobacco Use   Smoking status: Never   Smokeless tobacco: Never  Vaping Use   Vaping Use: Never used  Substance and Sexual Activity   Alcohol use: No   Drug use: No   Sexual activity: Not on file  Other Topics Concern   Not on file  Social History Narrative   ** Merged History Encounter **       LIVES IN [place] SINGLE TOBACCO USE .Marland KitchenMarland KitchenNO ALCOHOL USE.Marland Kitchen NO REGULAR EXERCISE.Marland Kitchen NO DRUG USE... NO   Social Determinants of Health   Financial Resource Strain: Not  on file  Food Insecurity: Not on file  Transportation Needs: Not on file  Physical Activity: Not on file  Stress: Not on file  Social Connections: Not on file  Intimate Partner Violence: Not on file    Family History  Problem Relation Age of Onset   Heart failure Brother     Current Outpatient Medications  Medication Sig Dispense Refill   atovaquone (MEPRON) 750 MG/5ML suspension Take 10 mLs (1,500 mg total) by mouth daily. 210 mL 0   atovaquone (MEPRON) 750 MG/5ML suspension [facility] will adjust 210 mL 0   carvedilol (COREG) 3.125 MG tablet [facility] will adjust  11   carvedilol (COREG) 6.25 MG tablet Take 12.5 mg by mouth 2 (two) times daily.     citalopram (CELEXA) 20 MG tablet [facility] will adjust 30 tablet 11   citalopram (CELEXA) 40 MG tablet Take 40 mg by mouth daily.     docusate (COLACE) 50 MG/5ML liquid Place 10 mLs (100 mg total) into feeding tube 2 (two) times daily as needed for mild constipation. 100 mL 0   enoxaparin (LOVENOX) 40 MG/0.4ML injection Inject 0.4 mLs (40 mg total) into the skin daily. 0 Syringe    famotidine (PEPCID)  20-0.9 MG/50ML-% Inject 50 mLs (20 mg total) into the vein daily. 50 mL    fentaNYL (SUBLIMAZE) SOLN Inject 50 mcg into the vein every hour as needed.  0   fentaNYL 10 mcg/ml SOLN infusion Inject 25-400 mcg/hr into the vein continuous.     furosemide (LASIX) 40 MG tablet Take 1 tablet (40 mg total) by mouth daily as needed (weight gain or swelling). (Patient taking differently: Take 40 mg by mouth daily. ) 30 tablet 6   ganciclovir in sodium chloride 0.9 % 100 mL Inject 176.5 mg into the vein 2 (two) times daily. With sodium chloride 0.9% SolP 100 mL     meropenem 2 g in sodium chloride 0.9 % 100 mL Inject 2 g into the vein every 12 (twelve) hours.     metroNIDAZOLE (FLAGYL) 5-0.79 MG/ML-% IVPB Inject 100 mLs (500 mg total) into the vein every 8 (eight) hours. 100 mL    mycophenolate (CELLCEPT) 500 MG tablet Take 500 mg by mouth 2 (two) times daily.     pantoprazole (PROTONIX) 40 MG tablet [facility] will adjust  11   pravastatin (PRAVACHOL) 40 MG tablet Take 40 mg by mouth daily.  3   predniSONE (DELTASONE) 10 MG tablet [facility] will adjust     predniSONE (DELTASONE) 5 MG tablet Take 5 mg by mouth daily.     predniSONE 5 MG/5ML solution Take 10 mLs (10 mg total) by mouth daily with breakfast. 100 mL 0   propofol (DIPRIVAN) 1000 MG/100ML EMUL injection Inject 372-3,720 mcg/min into the vein continuous.     sodium chloride 0.9 % infusion Inject 75 mLs into the vein continuous.  0   tacrolimus (PROGRAF) 1 MG capsule [facility] will adjust     No current facility-administered medications for this visit.    Allergies  Allergen Reactions   Phytonadione Other (See Comments)    Patient has LVAD: please check with LVAD coordinator on call or LVAD MD on call before reversal of anticoagulation with vit k     REVIEW OF SYSTEMS:   [X]  denotes positive finding, [ ]  denotes negative finding Cardiac  Comments:  Chest pain or chest pressure:    Shortness of breath upon exertion:    Short of breath when lying flat:  Irregular heart rhythm:        Vascular    Pain in calf, thigh, or hip brought on by ambulation:    Pain in feet at night that wakes you up from your sleep:     Blood clot in your veins:    Leg swelling:         Pulmonary    Oxygen at home:    Productive cough:     Wheezing:         Neurologic    Sudden weakness in arms or legs:     Sudden numbness in arms or legs:     Sudden onset of difficulty speaking or slurred speech:    Temporary loss of vision in one eye:     Problems with dizziness:         Gastrointestinal    Blood in stool:     Vomited blood:         Genitourinary    Burning when urinating:     Blood in urine:        Psychiatric    Major depression:         Hematologic    Bleeding problems:    Problems with blood clotting too easily:        Skin    Rashes or ulcers:        Constitutional    Fever or chills:      PHYSICAL EXAMINATION:  There were no vitals filed for this visit.  General:  WDWN in NAD; vital signs documented above Gait: Not observed HENT: WNL, normocephalic Pulmonary: normal non-labored breathing , without Rales, rhonchi,  wheezing Cardiac: regular HR Abdomen: soft, NT, no masses Skin: without rashes Vascular Exam/Pulses:  Right Left  Radial 2+ (normal) 2+ (normal)  Ulnar 2+ (normal) 2+ (normal)                   Extremities: without ischemic changes, without Gangrene , without cellulitis; without open wounds;  Musculoskeletal: no muscle wasting or atrophy  Neurologic: A&O X 3;  No focal weakness or paresthesias are detected Psychiatric:  The pt has Normal affect.   Non-Invasive Vascular Imaging:   Suitable brachial artery for inflow anastomoses bilaterally Bilateral superficial veins in the arms small, acceptable size is present in the left cephalic vein     ASSESSMENT/PLAN:  [name] is a 51 y.o. male who presents with chronic kidney disease stage 4  Based on vein mapping and examination, and he now  has suitable vein for left-sided brachiocephalic fistula. I had an extensive discussion with this patient in regards to the nature of access surgery, including risk, benefits, and alternatives.   The patient is aware that the risks of access surgery include but are not limited to: bleeding, infection, steal syndrome, nerve damage, ischemic monomelic neuropathy, failure of access to mature, complications related to venous hypertension, and possible need for additional access procedures in the future.  I discussed with the patient the nature of the staged access procedure, specifically the need for a second operation to transpose the first stage fistula if it matures adequately.   The patient has  agreed to proceed with the above procedure which will be scheduled in 1 month.  He would like to discuss surgery further with his sisters as well as his nephrologist Dr. Candiss Norse.  His case was tentatively booked for next month following his nephrology appointment.  [name], MD [facility] [phone]

## 2021-05-23 ENCOUNTER — Other Ambulatory Visit: Payer: Self-pay

## 2021-05-23 ENCOUNTER — Ambulatory Visit (INDEPENDENT_AMBULATORY_CARE_PROVIDER_SITE_OTHER)
Admission: RE | Admit: 2021-05-23 | Discharge: 2021-05-23 | Disposition: A | Discharge status: Home / Self Care | Payer: Medicare Other | Source: Ambulatory Visit | Attending: Vascular Surgery | Admitting: Vascular Surgery

## 2021-05-23 ENCOUNTER — Ambulatory Visit (INDEPENDENT_AMBULATORY_CARE_PROVIDER_SITE_OTHER): Payer: Medicare Other | Admitting: Vascular Surgery

## 2021-05-23 ENCOUNTER — Ambulatory Visit (HOSPITAL_COMMUNITY)
Admission: RE | Admit: 2021-05-23 | Discharge: 2021-05-23 | Disposition: A | Discharge status: Home / Self Care | Payer: Medicare Other | Source: Ambulatory Visit | Attending: Vascular Surgery | Admitting: Vascular Surgery

## 2021-05-23 ENCOUNTER — Encounter: Payer: Self-pay | Admitting: Vascular Surgery

## 2021-05-23 VITALS — BP 168/107 | HR 92 | Temp 98.4°F | Resp 20 | Ht 67.0 in | Wt 185.0 lb

## 2021-05-23 DIAGNOSIS — N184 Chronic kidney disease, stage 4 (severe): Secondary | ICD-10-CM

## 2021-05-23 DIAGNOSIS — N179 Acute kidney failure, unspecified: Secondary | ICD-10-CM

## 2021-07-02 ENCOUNTER — Other Ambulatory Visit (HOSPITAL_COMMUNITY): Payer: Self-pay | Admitting: Internal Medicine

## 2021-07-02 ENCOUNTER — Other Ambulatory Visit: Payer: Self-pay

## 2021-07-02 ENCOUNTER — Ambulatory Visit (HOSPITAL_COMMUNITY)
Admission: RE | Admit: 2021-07-02 | Discharge: 2021-07-02 | Disposition: A | Discharge status: Home / Self Care | Payer: Medicare Other | Source: Ambulatory Visit | Attending: Nephrology | Admitting: Nephrology

## 2021-07-02 VITALS — BP 162/106 | HR 61 | Resp 18

## 2021-07-02 DIAGNOSIS — N185 Chronic kidney disease, stage 5: Secondary | ICD-10-CM | POA: Diagnosis present

## 2021-07-02 DIAGNOSIS — N170 Acute kidney failure with tubular necrosis: Secondary | ICD-10-CM | POA: Insufficient documentation

## 2021-07-02 LAB — POCT HEMOGLOBIN-HEMACUE: Hemoglobin: 10.8 g/dL — ABNORMAL LOW (ref 13.0–17.0)

## 2021-07-02 MED ORDER — DARBEPOETIN ALFA 40 MCG/0.4ML IJ SOSY
40.0000 ug | PREFILLED_SYRINGE | INTRAMUSCULAR | Status: DC
  Administered 2021-07-02: 40 ug via SUBCUTANEOUS

## 2021-07-02 MED ORDER — DARBEPOETIN ALFA 40 MCG/0.4ML IJ SOSY
PREFILLED_SYRINGE | INTRAMUSCULAR | Status: AC
  Filled 2021-07-02: qty 0.4

## 2021-07-14 ENCOUNTER — Encounter (HOSPITAL_COMMUNITY): Payer: Self-pay | Admitting: Vascular Surgery

## 2021-07-14 ENCOUNTER — Other Ambulatory Visit: Payer: Self-pay

## 2021-07-14 NOTE — Anesthesia Preprocedure Evaluation (Addendum)
Anesthesia Evaluation  ?Patient identified by MRN, date of birth, ID band ?Patient awake ? ? ? ?Reviewed: ?Allergy & Precautions, NPO status , Patient's Chart, lab work & pertinent test results, reviewed documented beta blocker date and time  ? ?Airway ?Mallampati: II ? ?TM Distance: >3 FB ?Neck ROM: Full ? ? ? Dental ?no notable dental hx. ?(+) Dental Advisory Given, Teeth Intact ?  ?Pulmonary ?neg pulmonary ROS,  ?  ?Pulmonary exam normal ?breath sounds clear to auscultation ? ? ? ? ? ? Cardiovascular ?hypertension, Pt. on home beta blockers and Pt. on medications ?+CHF  ?Normal cardiovascular exam+ Cardiac Defibrillator ? ?Rhythm:Regular Rate:Normal ? ?Echo 04/04/21 ([facility]): ?MILD LV DYSFUNCTION (See above) WITH MODERATE LVH  ???ELEVATED LA?PRESSURES WITH DIASTOLIC DYSFUNCTION  ???MILD RV SYSTOLIC DYSFUNCTION (See above)  ???VALVULAR REGURGITATION: TRIVIAL AR, MILD MR, TRIVIAL PR, TRIVIAL TR  ???NO VALVULAR STENOSIS  ???MILD MR WITH EROA OF 0.11 cm2.  ?3D acquisition and reconstructions were performed as part of this?examination to more accurately quantify the effects of reduced left ventricular ejection fraction. (post-processing on an Independent workstation).  ?- ?Compared with prior Echo study on 10/04/2020: NO SIGNIFICANT CHANGE ? ? ?Echo 10/2020 ??1. Left ventricular ejection fraction, by estimation, is 40 to 45%. The left ventricle has mildly decreased function. The left ventricle demonstrates global hypokinesis. There is moderate concentric left ventricular hypertrophy. Indeterminate diastolic filling due to E-A fusion.  ??2. Right ventricular systolic function is normal. The right ventricular size is normal. There is normal pulmonary artery systolic pressure. The estimated right ventricular systolic pressure is 41.9 mmHg.  ??3. Left atrial size was severely dilated.  ??4. The mitral valve is normal in structure. Trivial mitral valve regurgitation.  ??5. The aortic valve  is tricuspid. Aortic valve regurgitation is not visualized.  ? ?  ?Neuro/Psych ?PSYCHIATRIC DISORDERS Depression negative neurological ROS ?   ? GI/Hepatic ?Neg liver ROS, GERD  ,  ?Endo/Other  ?Hyperthyroidism  ? Renal/GU ?Renal disease  ? ?  ?Musculoskeletal ? ?(+) Arthritis ,  ? Abdominal ?  ?Peds ? Hematology ? ?(+) Blood dyscrasia, anemia ,   ?Anesthesia Other Findings ? ? Reproductive/Obstetrics ? ?  ? ? ? ? ? ? ? ? ? ? ? ? ? ?  ?  ? ? ? ? ? ? ?Anesthesia Physical ?Anesthesia Plan ? ?ASA: 4 ? ?Anesthesia Plan: Regional  ? ?Post-op Pain Management: Minimal or no pain anticipated and Regional block*  ? ?Induction: Intravenous ? ?PONV Risk Score and Plan: 1 and Treatment may vary due to age or medical condition, Ondansetron, Dexamethasone and Midazolam ? ?Airway Management Planned: Natural Airway ? ?Additional Equipment:  ? ?Intra-op Plan:  ? ?Post-operative Plan:  ? ?Informed Consent: I have reviewed the patients History and Physical, chart, labs and discussed the procedure including the risks, benefits and alternatives for the proposed anesthesia with the patient or authorized representative who has indicated his/her understanding and acceptance.  ? ? ? ?Dental advisory given ? ?Plan Discussed with: CRNA ? ?Anesthesia Plan Comments: (PAT note written 07/14/2021 by Myra Gianotti, PA-C. Cardiac transplant 04/2017 at [facility]. ?)  ? ? ? ? ?Anesthesia Quick Evaluation ? ?

## 2021-07-14 NOTE — Progress Notes (Signed)
Anesthesia Chart Review: ? Case: [id number] Date/Time: 07/15/21 0715  ? Procedure: LEFT ARTERIOVENOUS (AV) FISTULA CREATION (Left) - PERIPHERAL NERVE BLOCK  ? Anesthesia type: Monitor Anesthesia Care  ? Pre-op diagnosis: ESRD  ? Location: [facility] OR ROOM [address] OR  ? Surgeons: [name], MD  ? ?  ? ? ?DISCUSSION: Patient is a 51 year old male scheduled for the above procedure. ? 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 ? ?Six month follow-up after 03/2021 [facility] cardiac transplant follow-up.  ? ?Patient with CKD stage IV.  He is not yet on dialysis but was referred to vascular surgery by nephrology for consideration of permanent hemodialysis access. ? ?He is a same-day work-up, anesthesia team to evaluate on the day of surgery.  Antirejection medications include prednisone 5 mg daily, Prograf 1 mg twice daily, CellCept 500 mg twice daily. ? ? ?VS:  ?BP Readings from Last 3 Encounters:  ?07/02/21 (!) 162/106  ?05/23/21 (!) 168/107  ?11/05/20 (!) 146/114  ? ?Pulse Readings from Last 3 Encounters:  ?07/02/21 61  ?05/23/21 92  ?11/05/20 (!) 119  ?Coreg increased and hydralazine added 04/07/21. ? ? ?PROVIDERS: ?Sandi Mariscal, MD is PCP  ? ?Gean Quint, MS is nephrologist. Also referred for kidney transplant evaluation at [facility]. ? ?Melvyn Novas, MD is transplant cardiologist [facility]), last visit 04/04/21. ?"S/P OHT:  ?-Patient is on a statin and immunosuppression remains with Tacrolimus, MMF, Prednisone  immunosuppression.  ?-FK a bit below trough goal 5-8 but I think OK in the context of progressive renal dysfunction ?-We will make no changes today to the overall medication regimen and will plan to keep him on prednisone indefinitely for now". CKD too advanced for ACE/ARB/MRA. He was referred to general surgery for ileostomy takedown evaluation. Follow-up 6 months planned.  ? ? ?LABS: For day of surgery. K 4.3, BUN 56, Cr 5.01 on 06/11/21 at [place].  H/H 12.5/43.9, PLT 189, A1c 6.1%, AST 20, ALT 15 on 04/04/21 ([facility] CE).  ? ? ?IMAGES: ?1V CXR 11/07/20 ([facility] CE, in setting of COVID-19): ?IMPRESSION:  ?1. New patchy pulmonary opacities overlying the left heart. Mild diffuse  ?pulmonary opacities. Findings may possibly representing atelectasis and  ?edema versus infection.  ? ? ?EKG:  ?EKG 11/06/20 ([facility] CE, in setting of COVID): ?Sinus tachycardia  ?Right bundle branch block  ?Left anterior fascicular block  ?Left ventricular hypertrophy with repolarization abnormalities  ?Late precordial R/S transition  ?Prolonged QT  ?Abnormal ECG  ? ?When compared with ECG of 20-Oct-2018 10:49,  ?Sinus tachycardia has replaced Sinus rhythm  ?Left atrial enlargement is now absent  ?I reviewed and concur with this report. Electronically signed LF:YBOFB, MD, [place] ([id number]) on 11/06/2020 5:48:29 PM ? ? ?EKG 11/05/20 (in setting of COVID): ?Sinus or ectopic atrial tachycardia ?RBBB and LAFB ?Left ventricular hypertrophy ?Abnrm T, consider ischemia, anterolateral lds ?Confirmed by Sherwood Gambler [phone]) on 11/05/2020 1:20:20 PM ? ? ?CV: ?Echo 04/04/21 ([facility] CE): ?MILD LV DYSFUNCTION (See above) WITH MODERATE LVH  ?  ELEVATED LA PRESSURES WITH DIASTOLIC DYSFUNCTION  ?  MILD RV SYSTOLIC DYSFUNCTION (See above)  ?  VALVULAR REGURGITATION: TRIVIAL AR, MILD MR, TRIVIAL PR, TRIVIAL TR  ?  NO VALVULAR STENOSIS  ?  MILD MR WITH EROA OF 0.11 cm2.  ?  3D acquisition and reconstructions were performed as part of this  ?  examination to more accurately  quantify the effects of reduced left  ?  ventricular ejection fraction. (post-processing on an Independent  ?  workstation).  ?-  Compared with prior Echo study on 10/04/2020: NO SIGNIFICANT CHANGE   ? ? ?Past Medical History:  ?Diagnosis Date  ? AICD (automatic cardioverter/defibrillator) present   ? pt not longer has this device  ? AKI (acute kidney injury) ([place])   ? Anemia   ? Atrial fibrillation -parosysmal    ? Rx w amiodarone  ? Automatic implantable cardiac defibrillator -BSX   ? single chamber  ? CHF (congestive heart failure) ([place])   ? Chronic kidney disease (CKD)   ? Stage 4  ? Chronic systolic heart failure ([place])   ? 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 COVID 2022  ? Admitted for 7 days  ? Elevated LFTs   ? GERD (gastroesophageal reflux disease)   ? GI bleed -massive   ? 11 Units 2012  ? Gout   ? H/O hyperthyroidism   ? denies  ? Hypertension   ? Noncompliance   ? H/O  MEDICAL NONCOMPLIANCE  ? 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    ? Polymorphic ventricular tachycardia   ? RECURRENT WITH APPROPRIATE SHOCK THERAPY IN THE PAST  ? RA (rheumatoid arthritis) ([place])   ? Severe mitral regurgitation   ? Tricuspid valve regurgitation   ? SEVERE  ? Ventricular fibrillation ([place])   ? WITH APPROPRIATE SHOCK THERAPY IN THE PAST  ? ? ?Past Surgical History:  ?Procedure Laterality Date  ? APPLICATION OF A-CELL OF EXTREMITY N/A 09/05/2015  ? Procedure: APPLICATION OF A-CELL OF sternum;  Surgeon: Loel Lofty Dillingham, DO;  Location: [place];  Service: Plastics;  Laterality: N/A;  ? APPLICATION OF WOUND VAC N/A 08/27/2015  ? Procedure: APPLICATION OF WOUND VAC;  Surgeon: Ivin Poot, MD;  Location: [place];  Service: Thoracic;  Laterality: N/A;  ? APPLICATION OF WOUND VAC N/A 08/29/2015  ? Procedure: WOUND VAC CHANGE;  Surgeon: Ivin Poot, MD;  Location: [place];  Service: Thoracic;  Laterality: N/A;  ? APPLICATION OF WOUND VAC N/A 09/05/2015  ? Procedure:  APPLICATION OF WOUND VAC to sternum;  Surgeon: Loel Lofty Dillingham, DO;  Location: [place];  Service: Plastics;  Laterality: N/A;  ? CARDIAC CATHETERIZATION  06/2006  ? RIGHT HEART CATH SHOWING SEVERE BIVENTRICUALR CHF WITH MARKED FILLING AND PRESSURES  ? CARDIAC CATHETERIZATION N/A 12/14/2014  ? Procedure: Right Heart Cath;  Surgeon: Jolaine Artist, MD;  Location: [place] CV LAB;  Service: Cardiovascular;  Laterality: N/A;  ? CARDIAC CATHETERIZATION N/A 12/14/2014  ? Procedure: IABP Insertion;  Surgeon: Jolaine Artist, MD;  Location: [place] CV LAB;  Service: Cardiovascular;  Laterality: N/A;  ? CARDIAC CATHETERIZATION N/A 12/18/2015  ? Procedure: Right Heart Cath;  Surgeon: Jolaine Artist, MD;  Location: [place] CV LAB;  Service: Cardiovascular;  Laterality: N/A;  ? CHOLECYSTECTOMY    ? ESOPHAGOGASTRODUODENOSCOPY N/A 09/17/2015  ? Procedure: ESOPHAGOGASTRODUODENOSCOPY (EGD);  Surgeon: Carol Ada, MD;  Location: [place];  Service: Gastroenterology;  Laterality: N/A;  ? HEART TRANSPLANT    ? ILEOSTOMY N/A 09/15/2017  ? Procedure: ILEOSTOMY;  Surgeon: Coralie Keens, MD;  [facility] OR;  Service: General;  Laterality: N/A;  ? IMPLANTABLE CARDIOVERTER DEFIBRILLATOR GENERATOR CHANGE N/A 07/01/2011  ? Procedure: IMPLANTABLE CARDIOVERTER DEFIBRILLATOR GENERATOR CHANGE;  Surgeon: Deboraha Sprang, MD;  Location: [facility] CATH LAB;  Service: Cardiovascular;  Laterality: N/A;  ? INCISION AND DRAINAGE OF WOUND N/A 09/05/2015  ? Procedure: IRRIGATION AND DEBRIDEMENT sternal WOUND;  Surgeon: Loel Lofty Dillingham, DO;  Location: [place];  Service: Plastics;  Laterality: N/A;  ? INSERT / REPLACE / REMOVE PACEMAKER    ? GUIDANT HE ICD MODEL 2180, SERIAL # [id number]  ? INSERTION OF DIALYSIS CATHETER Right 09/05/2015  ? Procedure: INSERTION OF DIALYSIS CATHETER;RIGHT SUBCLAVIAN;  Surgeon: Loel Lofty Dillingham, DO;  Location: [place];  Service: Plastics;  Laterality: Right;  ? INSERTION OF IMPLANTABLE LEFT VENTRICULAR ASSIST DEVICE  N/A 12/20/2014  ? Procedure: INSERTION OF IMPLANTABLE LEFT VENTRICULAR ASSIST DEVICE;  Surgeon: Ivin Poot, MD;  Location: [place];  Service: Open Heart Surgery;  Laterality: N/A;  CIRC ARREST ? ?NITRIC OXIDE  ? LAPA

## 2021-07-14 NOTE — Progress Notes (Signed)
Spoke with pt for pre-op call. Pt has an extensive heart history starting in 1997-07-20 with a sudden cardiac death that was successfully resuscitated. Pt has had ICD, then a LVAD and then a heart transplant in 07-20-17. Pt is treated for HTN and is not diabetic. He is Stage 4 CKD.  ? ?Chart sent to Anesthesia PA. ?

## 2021-07-15 ENCOUNTER — Other Ambulatory Visit: Payer: Self-pay

## 2021-07-15 ENCOUNTER — Encounter (HOSPITAL_COMMUNITY)
Admission: RE | Disposition: A | Discharge status: Home / Self Care | Payer: Self-pay | Source: Home / Self Care | Attending: Vascular Surgery

## 2021-07-15 ENCOUNTER — Encounter (HOSPITAL_COMMUNITY): Payer: Self-pay | Admitting: Vascular Surgery

## 2021-07-15 ENCOUNTER — Ambulatory Visit (HOSPITAL_BASED_OUTPATIENT_CLINIC_OR_DEPARTMENT_OTHER): Payer: Medicare Other | Admitting: Anesthesiology

## 2021-07-15 ENCOUNTER — Ambulatory Visit (HOSPITAL_COMMUNITY): Payer: Medicare Other | Admitting: Anesthesiology

## 2021-07-15 ENCOUNTER — Ambulatory Visit (HOSPITAL_COMMUNITY)
Admission: RE | Admit: 2021-07-15 | Discharge: 2021-07-15 | Disposition: A | Discharge status: Home / Self Care | Payer: Medicare Other | Attending: Vascular Surgery | Admitting: Vascular Surgery

## 2021-07-15 DIAGNOSIS — I509 Heart failure, unspecified: Secondary | ICD-10-CM

## 2021-07-15 DIAGNOSIS — Z992 Dependence on renal dialysis: Secondary | ICD-10-CM | POA: Diagnosis not present

## 2021-07-15 DIAGNOSIS — N186 End stage renal disease: Secondary | ICD-10-CM | POA: Insufficient documentation

## 2021-07-15 DIAGNOSIS — I5022 Chronic systolic (congestive) heart failure: Secondary | ICD-10-CM | POA: Insufficient documentation

## 2021-07-15 DIAGNOSIS — M069 Rheumatoid arthritis, unspecified: Secondary | ICD-10-CM | POA: Insufficient documentation

## 2021-07-15 DIAGNOSIS — E0590 Thyrotoxicosis, unspecified without thyrotoxic crisis or storm: Secondary | ICD-10-CM | POA: Diagnosis not present

## 2021-07-15 DIAGNOSIS — I132 Hypertensive heart and chronic kidney disease with heart failure and with stage 5 chronic kidney disease, or end stage renal disease: Secondary | ICD-10-CM | POA: Insufficient documentation

## 2021-07-15 DIAGNOSIS — I480 Paroxysmal atrial fibrillation: Secondary | ICD-10-CM | POA: Insufficient documentation

## 2021-07-15 DIAGNOSIS — Z8616 Personal history of COVID-19: Secondary | ICD-10-CM | POA: Diagnosis not present

## 2021-07-15 DIAGNOSIS — N184 Chronic kidney disease, stage 4 (severe): Secondary | ICD-10-CM

## 2021-07-15 DIAGNOSIS — I130 Hypertensive heart and chronic kidney disease with heart failure and stage 1 through stage 4 chronic kidney disease, or unspecified chronic kidney disease: Secondary | ICD-10-CM

## 2021-07-15 HISTORY — DX: Gastro-esophageal reflux disease without esophagitis: K21.9

## 2021-07-15 HISTORY — DX: Anemia, unspecified: D64.9

## 2021-07-15 HISTORY — PX: AV FISTULA PLACEMENT: SHX1204

## 2021-07-15 HISTORY — DX: Chronic kidney disease, unspecified: N18.9

## 2021-07-15 LAB — POCT I-STAT, CHEM 8
BUN: 87 mg/dL — ABNORMAL HIGH (ref 6–20)
Calcium, Ion: 1.25 mmol/L (ref 1.15–1.40)
Chloride: 113 mmol/L — ABNORMAL HIGH (ref 98–111)
Creatinine, Ser: 6.1 mg/dL — ABNORMAL HIGH (ref 0.61–1.24)
Glucose, Bld: 90 mg/dL (ref 70–99)
HCT: 31 % — ABNORMAL LOW (ref 39.0–52.0)
Hemoglobin: 10.5 g/dL — ABNORMAL LOW (ref 13.0–17.0)
Potassium: 4.8 mmol/L (ref 3.5–5.1)
Sodium: 140 mmol/L (ref 135–145)
TCO2: 20 mmol/L — ABNORMAL LOW (ref 22–32)

## 2021-07-15 SURGERY — ARTERIOVENOUS (AV) FISTULA CREATION
Anesthesia: Regional | Site: Arm Upper | Laterality: Left

## 2021-07-15 MED ORDER — HEMOSTATIC AGENTS (NO CHARGE) OPTIME
TOPICAL | Status: DC | PRN
  Administered 2021-07-15: 1 via TOPICAL

## 2021-07-15 MED ORDER — ACETAMINOPHEN 500 MG PO TABS
1000.0000 mg | ORAL_TABLET | Freq: Once | ORAL | Status: AC
  Administered 2021-07-15: 1000 mg via ORAL
  Filled 2021-07-15: qty 2

## 2021-07-15 MED ORDER — HEPARIN SODIUM (PORCINE) 1000 UNIT/ML IJ SOLN
INTRAMUSCULAR | Status: DC | PRN
  Administered 2021-07-15: 2000 [IU] via INTRAVENOUS

## 2021-07-15 MED ORDER — HEPARIN 6000 UNIT IRRIGATION SOLUTION
Status: DC | PRN
Start: 2021-07-15 — End: 2021-07-15
  Administered 2021-07-15: 1

## 2021-07-15 MED ORDER — CHLORHEXIDINE GLUCONATE 4 % EX LIQD: 60.0000 mL | Freq: Once | CUTANEOUS | Status: DC

## 2021-07-15 MED ORDER — CHLORHEXIDINE GLUCONATE 0.12 % MT SOLN
OROMUCOSAL | Status: AC
  Administered 2021-07-15: 15 mL
  Filled 2021-07-15: qty 15

## 2021-07-15 MED ORDER — OXYCODONE HCL 5 MG PO TABS: 5.0000 mg | ORAL_TABLET | Freq: Once | ORAL | Status: DC | PRN

## 2021-07-15 MED ORDER — CEFAZOLIN SODIUM-DEXTROSE 2-4 GM/100ML-% IV SOLN
2.0000 g | INTRAVENOUS | Status: AC
Start: 2021-07-15 — End: 2021-07-15
  Administered 2021-07-15: 2 g via INTRAVENOUS
  Filled 2021-07-15: qty 100

## 2021-07-15 MED ORDER — HYDROCODONE-ACETAMINOPHEN 5-325 MG PO TABS: 1.0000 | ORAL_TABLET | Freq: Four times a day (QID) | ORAL | 0 refills | Status: DC | PRN

## 2021-07-15 MED ORDER — PROPOFOL 500 MG/50ML IV EMUL
INTRAVENOUS | Status: DC | PRN
  Administered 2021-07-15: 60 ug/kg/min via INTRAVENOUS

## 2021-07-15 MED ORDER — FENTANYL CITRATE (PF) 250 MCG/5ML IJ SOLN
INTRAMUSCULAR | Status: DC | PRN
  Administered 2021-07-15: 50 ug via INTRAVENOUS

## 2021-07-15 MED ORDER — LIDOCAINE HCL (PF) 2 % IJ SOLN
INTRAMUSCULAR | Status: DC | PRN
  Administered 2021-07-15: 250 mg via PERINEURAL

## 2021-07-15 MED ORDER — FENTANYL CITRATE (PF) 100 MCG/2ML IJ SOLN: 25.0000 ug | INTRAMUSCULAR | Status: DC | PRN

## 2021-07-15 MED ORDER — LIDOCAINE-EPINEPHRINE 1 %-1:100000 IJ SOLN: INTRAMUSCULAR | Status: DC | PRN

## 2021-07-15 MED ORDER — MEPERIDINE HCL 25 MG/ML IJ SOLN: 6.2500 mg | INTRAMUSCULAR | Status: DC | PRN

## 2021-07-15 MED ORDER — AMISULPRIDE (ANTIEMETIC) 5 MG/2ML IV SOLN: 10.0000 mg | Freq: Once | INTRAVENOUS | Status: DC | PRN

## 2021-07-15 MED ORDER — LIDOCAINE HCL (PF) 1 % IJ SOLN
INTRAMUSCULAR | Status: AC
  Filled 2021-07-15: qty 30

## 2021-07-15 MED ORDER — LIDOCAINE-EPINEPHRINE 1 %-1:100000 IJ SOLN
INTRAMUSCULAR | Status: DC | PRN
  Administered 2021-07-15: 12.5 mL

## 2021-07-15 MED ORDER — SODIUM CHLORIDE 0.9 % IV SOLN
INTRAVENOUS | Status: DC
Start: 2021-07-15 — End: 2021-07-15

## 2021-07-15 MED ORDER — FENTANYL CITRATE (PF) 250 MCG/5ML IJ SOLN
INTRAMUSCULAR | Status: AC
  Filled 2021-07-15: qty 5

## 2021-07-15 MED ORDER — OXYCODONE HCL 5 MG/5ML PO SOLN: 5.0000 mg | Freq: Once | ORAL | Status: DC | PRN

## 2021-07-15 MED ORDER — PHENYLEPHRINE 40 MCG/ML (10ML) SYRINGE FOR IV PUSH (FOR BLOOD PRESSURE SUPPORT)
PREFILLED_SYRINGE | INTRAVENOUS | Status: AC
  Filled 2021-07-15: qty 10

## 2021-07-15 MED ORDER — PHENYLEPHRINE 40 MCG/ML (10ML) SYRINGE FOR IV PUSH (FOR BLOOD PRESSURE SUPPORT)
PREFILLED_SYRINGE | INTRAVENOUS | Status: DC | PRN
Start: 2021-07-15 — End: 2021-07-15
  Administered 2021-07-15 (×2): 80 ug via INTRAVENOUS
  Administered 2021-07-15: 120 ug via INTRAVENOUS

## 2021-07-15 MED ORDER — 0.9 % SODIUM CHLORIDE (POUR BTL) OPTIME
TOPICAL | Status: DC | PRN
Start: 2021-07-15 — End: 2021-07-15
  Administered 2021-07-15: 1000 mL

## 2021-07-15 MED ORDER — HEPARIN 6000 UNIT IRRIGATION SOLUTION
Status: AC
  Filled 2021-07-15: qty 500

## 2021-07-15 MED ORDER — PHENYLEPHRINE HCL-NACL 20-0.9 MG/250ML-% IV SOLN
INTRAVENOUS | Status: DC | PRN
  Administered 2021-07-15: 40 ug/min via INTRAVENOUS

## 2021-07-15 SURGICAL SUPPLY — 39 items
ARMBAND PINK RESTRICT EXTREMIT (MISCELLANEOUS) ×2 IMPLANT
BAG COUNTER SPONGE SURGICOUNT (BAG) ×2 IMPLANT
BLADE CLIPPER SURG (BLADE) ×2 IMPLANT
CANISTER SUCT 3000ML PPV (MISCELLANEOUS) ×2 IMPLANT
CATH EMB 2FR 60CM (CATHETERS) ×1 IMPLANT
CLIP LIGATING EXTRA MED SLVR (CLIP) ×2 IMPLANT
CLIP LIGATING EXTRA SM BLUE (MISCELLANEOUS) ×2 IMPLANT
CLIP VESOCCLUDE MED 6/CT (CLIP) ×2 IMPLANT
COVER PROBE W GEL 5X96 (DRAPES) ×2 IMPLANT
DECANTER SPIKE VIAL GLASS SM (MISCELLANEOUS) ×2 IMPLANT
DERMABOND ADVANCED (GAUZE/BANDAGES/DRESSINGS) ×1
DERMABOND ADVANCED .7 DNX12 (GAUZE/BANDAGES/DRESSINGS) ×1 IMPLANT
ELECT REM PT RETURN 9FT ADLT (ELECTROSURGICAL) ×2
ELECTRODE REM PT RTRN 9FT ADLT (ELECTROSURGICAL) ×1 IMPLANT
GLOVE SRG 8 PF TXTR STRL LF DI (GLOVE) ×2 IMPLANT
GLOVE SURG POLYISO LF SZ8 (GLOVE) IMPLANT
GLOVE SURG UNDER POLY LF SZ8 (GLOVE) ×4
GOWN STRL REUS W/ TWL LRG LVL3 (GOWN DISPOSABLE) ×2 IMPLANT
GOWN STRL REUS W/TWL 2XL LVL3 (GOWN DISPOSABLE) ×2 IMPLANT
GOWN STRL REUS W/TWL LRG LVL3 (GOWN DISPOSABLE) ×4
HEMOSTAT SNOW SURGICEL 2X4 (HEMOSTASIS) ×1 IMPLANT
KIT BASIN OR (CUSTOM PROCEDURE TRAY) ×2 IMPLANT
KIT TURNOVER KIT B (KITS) ×2 IMPLANT
LIGACLIP SM TITANIUM (CLIP) ×1 IMPLANT
NS IRRIG 1000ML POUR BTL (IV SOLUTION) ×2 IMPLANT
PACK CV ACCESS (CUSTOM PROCEDURE TRAY) ×2 IMPLANT
PAD ARMBOARD 7.5X6 YLW CONV (MISCELLANEOUS) ×4 IMPLANT
SLING ARM FOAM STRAP LRG (SOFTGOODS) IMPLANT
SLING ARM FOAM STRAP MED (SOFTGOODS) IMPLANT
SUT MNCRL AB 4-0 PS2 18 (SUTURE) ×3 IMPLANT
SUT PROLENE 6 0 BV (SUTURE) ×4 IMPLANT
SUT PROLENE 7 0 BV 1 (SUTURE) IMPLANT
SUT SILK 2 0 SH (SUTURE) ×2 IMPLANT
SUT SILK 3 0 SH CR/8 (SUTURE) ×2 IMPLANT
SUT VIC AB 3-0 SH 27 (SUTURE) ×6
SUT VIC AB 3-0 SH 27X BRD (SUTURE) ×1 IMPLANT
TOWEL GREEN STERILE (TOWEL DISPOSABLE) ×2 IMPLANT
UNDERPAD 30X36 HEAVY ABSORB (UNDERPADS AND DIAPERS) ×2 IMPLANT
WATER STERILE IRR 1000ML POUR (IV SOLUTION) ×2 IMPLANT

## 2021-07-15 NOTE — H&P (Signed)
?Office Note  ? ?Patient seen and examined in preop holding.  No complaints. ?No changes to medication history or physical exam since last seen in clinic. ?After discussing the risks and benefits of left arm AV fistula creation, [name] elected to proceed.  ? ?[name] John MD ? ? ?CC:  ESRD ?Requesting Provider:  No ref. provider found ? ?HPI: [name] is a Right handed 51 y.o. (12-Mar-1971) male with kidney disease who presents at the request of No ref. provider found for permanent HD access. Other medical history includes CKD Stage 4, diverticulitis requiring ileostomy 2020,  HTN, had LVAD then later had OHT 04/2017 at [facility].  ?  ?The patient has had no prior access procedures. Not currently on dialysis. ? ?On exam, [name] was doing well.  Originally from [place], he is living [place] for a number of years.  His sisters are in [place], and have been very active in his care.  MD no was aware that he would need dialysis, but had not discussed fistula creation. ? ?The pt is not on a statin for cholesterol management.  ?The pt is not on a daily aspirin.   Other AC:  - ?The pt is not on medications for hypertension.   ?The pt is not diabetic. ?Tobacco hx:  - ? ?Past Medical History:  ?Diagnosis Date  ? AICD (automatic cardioverter/defibrillator) present   ? pt not longer has this device  ? AKI (acute kidney injury) ([place])   ? Anemia   ? Atrial fibrillation -parosysmal    ? Rx w amiodarone  ? Automatic implantable cardiac defibrillator -BSX   ? single chamber  ? CHF (congestive heart failure) ([place])   ? Chronic kidney disease (CKD)   ? Stage 4  ? Chronic systolic heart failure ([place])   ? 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 COVID 2022  ? Admitted for 7 days  ? Elevated LFTs   ? GERD (gastroesophageal reflux disease)   ? GI bleed -massive   ? 11 Units 2012  ? Gout   ? H/O hyperthyroidism   ? denies  ? Hypertension   ? Noncompliance   ? H/O  MEDICAL NONCOMPLIANCE  ? Personal  history of sudden cardiac death successfully resuscitated 08/18/1997  ?    ? Polymorphic ventricular tachycardia   ? RECURRENT WITH APPROPRIATE SHOCK THERAPY IN THE PAST  ? RA (rheumatoid arthritis) ([place])   ? Severe mitral regurgitation   ? Tricuspid valve regurgitation   ? SEVERE  ? Ventricular fibrillation ([place])   ? WITH APPROPRIATE SHOCK THERAPY IN THE PAST  ? ? ?Past Surgical History:  ?Procedure Laterality Date  ? APPLICATION OF A-CELL OF EXTREMITY N/A 09/05/2015  ? Procedure: APPLICATION OF A-CELL OF sternum;  Surgeon: Loel Lofty Dillingham, DO;  Location: [place];  Service: Plastics;  Laterality: N/A;  ? APPLICATION OF WOUND VAC N/A 08/27/2015  ? Procedure: APPLICATION OF WOUND VAC;  Surgeon: Ivin Poot, MD;  Location: [place];  Service: Thoracic;  Laterality: N/A;  ? APPLICATION OF WOUND VAC N/A 08/29/2015  ? Procedure: WOUND VAC CHANGE;  Surgeon: Ivin Poot, MD;  Location: [place];  Service: Thoracic;  Laterality: N/A;  ? APPLICATION OF WOUND VAC N/A 09/05/2015  ? Procedure: APPLICATION OF WOUND VAC to sternum;  Surgeon: Loel Lofty Dillingham, DO;  Location: [place];  Service: Plastics;  Laterality: N/A;  ? CARDIAC CATHETERIZATION  06/2006  ? RIGHT HEART CATH SHOWING SEVERE BIVENTRICUALR CHF WITH  MARKED FILLING AND PRESSURES  ? CARDIAC CATHETERIZATION N/A 12/14/2014  ? Procedure: Right Heart Cath;  Surgeon: Jolaine Artist, MD;  Location: [place] CV LAB;  Service: Cardiovascular;  Laterality: N/A;  ? CARDIAC CATHETERIZATION N/A 12/14/2014  ? Procedure: IABP Insertion;  Surgeon: Jolaine Artist, MD;  Location: [place] CV LAB;  Service: Cardiovascular;  Laterality: N/A;  ? CARDIAC CATHETERIZATION N/A 12/18/2015  ? Procedure: Right Heart Cath;  Surgeon: Jolaine Artist, MD;  Location: [place] CV LAB;  Service: Cardiovascular;  Laterality: N/A;  ? CHOLECYSTECTOMY    ? ESOPHAGOGASTRODUODENOSCOPY N/A 09/17/2015  ? Procedure: ESOPHAGOGASTRODUODENOSCOPY (EGD);  Surgeon: Carol Ada, MD;  Location: [place];   Service: Gastroenterology;  Laterality: N/A;  ? HEART TRANSPLANT    ? ILEOSTOMY N/A 09/15/2017  ? Procedure: ILEOSTOMY;  Surgeon: Coralie Keens, MD;  Location: [place];  Service: General;  Laterality: N/A;  ? IMPLANTABLE CARDIOVERTER DEFIBRILLATOR GENERATOR CHANGE N/A 07/01/2011  ? Procedure: IMPLANTABLE CARDIOVERTER DEFIBRILLATOR GENERATOR CHANGE;  Surgeon: Deboraha Sprang, MD;  Location: [facility] CATH LAB;  Service: Cardiovascular;  Laterality: N/A;  ? INCISION AND DRAINAGE OF WOUND N/A 09/05/2015  ? Procedure: IRRIGATION AND DEBRIDEMENT sternal WOUND;  Surgeon: Loel Lofty Dillingham, DO;  Location: [place];  Service: Plastics;  Laterality: N/A;  ? INSERT / REPLACE / REMOVE PACEMAKER    ? GUIDANT HE ICD MODEL 2180, SERIAL # [id number]  ? INSERTION OF DIALYSIS CATHETER Right 09/05/2015  ? Procedure: INSERTION OF DIALYSIS CATHETER;RIGHT SUBCLAVIAN;  Surgeon: Loel Lofty Dillingham, DO;  Location: [place];  Service: Plastics;  Laterality: Right;  ? INSERTION OF IMPLANTABLE LEFT VENTRICULAR ASSIST DEVICE N/A 12/20/2014  ? Procedure: INSERTION OF IMPLANTABLE LEFT VENTRICULAR ASSIST DEVICE;  Surgeon: Ivin Poot, MD;  Location: [place];  Service: Open Heart Surgery;  Laterality: N/A;  CIRC ARREST ? ?NITRIC OXIDE  ? LAPAROTOMY N/A 09/15/2017  ? Procedure: EXPLORATORY LAPAROTOMY; RIGHT HEMICOLECTOMY;  Surgeon: Coralie Keens, MD;  Location: [place];  Service: General;  Laterality: N/A;  ? PECTORALIS FLAP N/A 09/09/2015  ? Procedure: DEBRIDEMENT AND CLOSURE WOUND WITH PLACEMENT OF ABRA CLOSURE DEVICE;  Surgeon: Loel Lofty Dillingham, DO;  Location: [place];  Service: Plastics;  Laterality: N/A;  ? STERNAL CLOSURE N/A 09/17/2015  ? Procedure: ABDOMINAL WOUND CLOSURE WITH INCISIONAL VAC APPLICATION;  Surgeon: Ivin Poot, MD;  Location: [place];  Service: Thoracic;  Laterality: N/A;  ? STERNAL WOUND DEBRIDEMENT N/A 08/27/2015  ? Procedure: WOUND IRRIGATION AND DEBRIDEMENT;  Surgeon: Ivin Poot, MD;  Location: [place];  Service: Thoracic;   Laterality: N/A;  ? STERNAL WOUND DEBRIDEMENT N/A 08/29/2015  ? Procedure: DEBRIDEMENT OF chest wound;  Surgeon: Ivin Poot, MD;  Location: [place];  Service: Thoracic;  Laterality: N/A;  ? TEE WITHOUT CARDIOVERSION N/A 12/12/2014  ? Procedure: TRANSESOPHAGEAL ECHOCARDIOGRAM (TEE);  Surgeon: Jerline Pain, MD;  Location: [place];  Service: Cardiovascular;  Laterality: N/A;  ? TEE WITHOUT CARDIOVERSION N/A 12/20/2014  ? Procedure: TRANSESOPHAGEAL ECHOCARDIOGRAM (TEE);  Surgeon: Ivin Poot, MD;  Location: [place];  Service: Open Heart Surgery;  Laterality: N/A;  ? ? ?Social History  ? ?Socioeconomic History  ? Marital status: Single  ?  Spouse name: Not on file  ? Number of children: Not on file  ? Years of education: Not on file  ? Highest education level: Not on file  ?Occupational History  ? Occupation: TAX COLLECTOR  ?  Employer: [employer]  ?Tobacco Use  ? Smoking status:  Never  ? Smokeless tobacco: Never  ?Vaping Use  ? Vaping Use: Never used  ?Substance and Sexual Activity  ? Alcohol use: No  ? Drug use: No  ? Sexual activity: Not on file  ?Other Topics Concern  ? Not on file  ?Social History Narrative  ? ** Merged History Encounter **  ?    ? LIVES IN [place] ?SINGLE ?TOBACCO USE .Marland KitchenMarland KitchenNO ?ALCOHOL USE.. NO ?REGULAR EXERCISE.. NO ?DRUG USE... NO  ? ?Social Determinants of Health  ? ?Financial Resource Strain: Not on file  ?Food Insecurity: Not on file  ?Transportation Needs: Not on file  ?Physical Activity: Not on file  ?Stress: Not on file  ?Social Connections: Not on file  ?Intimate Partner Violence: Not on file  ? ? ?Family History  ?Problem Relation Age of Onset  ? Heart failure Brother   ? ? ?Current Facility-Administered Medications  ?Medication Dose Route Frequency Provider Last Rate Last Admin  ? 0.9 %  sodium chloride infusion   Intravenous Continuous [name] John, MD   New Bag at 07/15/21 [phone]  ? ceFAZolin (ANCEF) IVPB 2g/100 mL premix  2 g Intravenous 30 min Pre-Op [name] John,  MD      ? chlorhexidine (HIBICLENS) 4 % liquid 4 application.  60 mL Topical Once [name] John, MD      ? And  ? [START ON 07/16/2021] chlorhexidine (HIBICLENS) 4 % liquid 4 application.  60 mL Topical Once Shirlean Mylar

## 2021-07-15 NOTE — Op Note (Signed)
? ? ?  NAME: [name]    MRN: [id number] ?DOB: 05/11/1970    DATE OF OPERATION: 07/15/2021 ? ?PREOP DIAGNOSIS:   ? ?Chronic Kidney Disease Stage 4 ? ?POSTOP DIAGNOSIS:   ? ?same ? ?PROCEDURE:  ?  ?Left brachiocephalic fistula creation, cephalic vein branch ligation ? ?SURGEON: [name] ? ?ASSIST: Dagoberto Ligas, PA ? ?ANESTHESIA: General ? ?EBL: 20 mL ? ?INDICATIONS:  ? ? [name] is a 51 y.o. male with chronic kidney disease stage IV.  He was recently seen in the office for long-term HD access.  Imaging demonstrated adequate left-sided cephalic vein.  Being that the patient is right-handed, we discussed left-sided radiocephalic fistula creation.  After discussing the risk and benefits, [name] elected to proceed. ? ?FINDINGS:  ? ?5 mm, sclerotic cephalic vein, 5 mm brachial artery ? ?TECHNIQUE:  ? ?A left upper extremity block was performed by the anesthesia team. The patient was brought to the operating room and placed in supine position. The left arm was prepped and draped in standard fashion. IV antibiotics were administered. A timeout was performed.  ? ?The case began with ultrasound insonation of the brachial artery and cephalic vein, which demonstrated sufficient size at the antecubital fossa for arteriovenous fistula.  ? ?A transverse incision was made below the elbow creese in the antecubital fossa. The  cephalic vein was isolated for 3 cm in length. Next the aponeurosis was partially released and the brachial artery secured with a vessel loop. The patient was heparinized. The cephalic vein was transected and ligated distally with a 2-0 silk stick-tie. The vein was dilated with coronary dilators and flushed with heparin saline. Vascular clamps were placed proximally and distally on the brachial artery and a 5 mm arteriotomy was created on the brachial artery. This was flushed with heparin saline.  An anastomosis was created in end to side fashion on the brachial artery using running 6-0  Prolene suture. ? ?Prior to completing the anastomosis, the vessels were flushed and the suture line was tied down. There was pulsatility in the cephalic vein.  Using ultrasound to assess the fistula, and was found a branch point at the mid humerus.  I made a small longitudinal incision exposing the branch.  I used this point branches to run a #2 Fogarty embolectomy catheter up to the cephalic vein.  Multiple valves were appreciated.  No thrombus.  This improved pulsatility within the fistula.  The branch was ligated using 3-0 silk suture and a clip.  At case completion, he had a thrill in the fistula with palpable radial pulse.  The incision was irrigated and hemostasis achieved with cautery and suture. The deeper tissue was closed with 3-0 Vicryl and the skin closed with 4-0 Monocryl. ? ?Dermabond was applied to the incisions. The patient was transferred to PACU in stable condition. ? ?Given the complexity of the case,  the assistant was necessary in order to expedient the procedure and safely perform the technical aspects of the operation.  The assistant provided traction and countertraction to assist with exposure of the artery and vein.  They also assisted with suture ligation of multiple venous branches.  They played a critical role in the anastomosis. These skills, especially following the Prolene suture for the anastomosis, could not have been adequately performed by a scrub tech assistant.  ? ? ?[name] Burows, MD ?[facility] ? ?DATE OF DICTATION:   07/15/2021 ? ?

## 2021-07-15 NOTE — Discharge Instructions (Signed)
° °  [facility] ° °Discharge Instructions ° °AV Fistula or Graft Surgery for Dialysis Access ° °Please refer to the following instructions for your post-procedure care. Your surgeon or physician assistant will discuss any changes with you. ° °Activity ° °You may drive the day following your surgery, if you are comfortable and no longer taking prescription pain medication. Resume full activity as the soreness in your incision resolves. ° °Bathing/Showering ° °You may shower after you go home. Keep your incision dry for 48 hours. Do not soak in a bathtub, hot tub, or swim until the incision heals completely. You may not shower if you have a hemodialysis catheter. ° °Incision Care ° °Clean your incision with mild soap and water after 48 hours. Pat the area dry with a clean towel. You do not need a bandage unless otherwise instructed. Do not apply any ointments or creams to your incision. You may have skin glue on your incision. Do not peel it off. It will come off on its own in about one week. Your arm may swell a bit after surgery. To reduce swelling use pillows to elevate your arm so it is above your heart. Your doctor will tell you if you need to lightly wrap your arm with an ACE bandage. ° °Diet ° °Resume your normal diet. There are not special food restrictions following this procedure. In order to heal from your surgery, it is CRITICAL to get adequate nutrition. Your body requires vitamins, minerals, and protein. Vegetables are the best source of vitamins and minerals. Vegetables also provide the perfect balance of protein. Processed food has little nutritional value, so try to avoid this. ° °Medications ° °Resume taking all of your medications. If your incision is causing pain, you may take over-the counter pain relievers such as acetaminophen (Tylenol). If you were prescribed a stronger pain medication, please be aware these medications can cause nausea and constipation. Prevent  nausea by taking the medication with a snack or meal. Avoid constipation by drinking plenty of fluids and eating foods with high amount of fiber, such as fruits, vegetables, and grains. Do not take Tylenol if you are taking prescription pain medications. ° ° ° ° °Follow up °Your surgeon may want to see you in the office following your access surgery. If so, this will be arranged at the time of your surgery. ° °Please call us immediately for any of the following conditions: ° °Increased pain, redness, drainage (pus) from your incision site °Fever of 101 degrees or higher °Severe or worsening pain at your incision site °Hand pain or numbness. ° °Reduce your risk of vascular disease: ° °Stop smoking. If you would like help, call [facility] at 1-800-QUIT-NOW (1-800-784-8669) or [place] at [phone] ° °Manage your cholesterol °Maintain a desired weight °Control your diabetes °Keep your blood pressure down ° °Dialysis ° °It will take several weeks to several months for your new dialysis access to be ready for use. Your surgeon will determine when it is OK to use it. Your nephrologist will continue to direct your dialysis. You can continue to use your Permcath until your new access is ready for use. ° °If you have any questions, please call the office at [phone]. ° °

## 2021-07-15 NOTE — Anesthesia Procedure Notes (Signed)
Procedure Name: [place] ?Date/Time: 07/15/2021 7:42 AM ?Performed by: Erick Colace, CRNA ?Pre-anesthesia Checklist: Patient identified, Emergency Drugs available, Suction available, Patient being monitored and Timeout performed ?Patient Re-evaluated:Patient Re-evaluated prior to induction ?Oxygen Delivery Method: Simple face mask ?Preoxygenation: Pre-oxygenation with 100% oxygen ?Induction Type: IV induction ?Dental Injury: Teeth and Oropharynx as per pre-operative assessment  ? ? ? ? ?

## 2021-07-15 NOTE — Progress Notes (Signed)
Orthopedic Tech Progress Note ?Patient Details:  ?Inez Catalina ?1971-03-10 ?[id number] ? ?PACU RN called requesting an ARM SLING  ? ?Ortho Devices ?Type of Ortho Device: Arm sling ?Ortho Device/Splint Location: LUE ?Ortho Device/Splint Interventions: Ordered ?  ?Post Interventions ?Patient Tolerated: Well ?Instructions Provided: Care of device ? ?[name] ?07/15/2021, 9:45 AM ? ?

## 2021-07-15 NOTE — Anesthesia Procedure Notes (Signed)
Anesthesia Regional Block: Supraclavicular block  ? ?Pre-Anesthetic Checklist: , timeout performed,  Correct Patient, Correct Site, Correct Laterality,  Correct Procedure, Correct Position, site marked,  Risks and benefits discussed,  Surgical consent,  Pre-op evaluation,  At surgeon's request and post-op pain management ? ?Laterality: Upper and Left ? ?Prep: chloraprep     ?  ?Needles:  ?Injection technique: Single-shot ? ?Needle Type: Stimiplex   ? ? ? ? ? ? ? ?Additional Needles: ? ? ?Procedures:,,,, ultrasound used (permanent image in chart),,    ?Narrative:  ?Start time: 07/15/2021 7:03 AM ?End time: 07/15/2021 7:23 AM ?Injection made incrementally with aspirations every 5 mL. ? ?Performed by: Personally  ?Anesthesiologist: Nolon Nations, MD ? ?Additional Notes: ?BP cuff, SpO2 and EKG monitors applied. Sedation begun. Nerve location verified with ultrasound. Anesthetic injected incrementally, slowly, and after neg aspirations under direct u/s guidance. Good perineural spread. Tolerated well. ? ? ? ? ?

## 2021-07-15 NOTE — Transfer of Care (Signed)
Immediate Anesthesia Transfer of Care Note ? ?Patient: [name] ? ?Procedure(s) Performed: LEFT BRACHIOCEPHALIC ARTERIOVENOUS FISTULA CREATION WITH CEPHALIC BRANCH LIGATION (Left: Arm Upper) ? ?Patient Location: PACU ? ?Anesthesia Type:MAC and Regional ? ?Level of Consciousness: drowsy ? ?Airway & Oxygen Therapy: Patient Spontanous Breathing ? ?Post-op Assessment: Report given to RN and Post -op Vital signs reviewed and stable ? ?Post vital signs: Reviewed and stable ? ?Last Vitals:  ?Vitals Value Taken Time  ?BP 117/74 07/15/21 0917  ?Temp    ?Pulse 88 07/15/21 0919  ?Resp 22 07/15/21 0919  ?SpO2 97 % 07/15/21 0919  ?Vitals shown include unvalidated device data. ? ?Last Pain:  ?Vitals:  ? 07/15/21 0621  ?TempSrc:   ?PainSc: 0-No pain  ?   ? ?  ? ?Complications: No notable events documented. ?

## 2021-07-16 ENCOUNTER — Encounter (HOSPITAL_COMMUNITY): Payer: Self-pay | Admitting: Vascular Surgery

## 2021-07-16 NOTE — Anesthesia Postprocedure Evaluation (Signed)
Anesthesia Post Note ? ?Patient: [name] ? ?Procedure(s) Performed: LEFT BRACHIOCEPHALIC ARTERIOVENOUS FISTULA CREATION WITH CEPHALIC BRANCH LIGATION (Left: Arm Upper) ? ?  ? ?Patient location during evaluation: PACU ?Anesthesia Type: Regional ?Level of consciousness: awake and alert ?Pain management: pain level controlled ?Vital Signs Assessment: post-procedure vital signs reviewed and stable ?Respiratory status: spontaneous breathing ?Cardiovascular status: stable ?Anesthetic complications: no ? ? ?No notable events documented. ? ?Last Vitals:  ?Vitals:  ? 07/15/21 0948 07/15/21 1003  ?BP: (!) 140/101 (!) 141/94  ?Pulse: 88 87  ?Resp: 17 12  ?Temp:  36.7 ?C  ?SpO2: 99% 97%  ?  ?Last Pain:  ?Vitals:  ? 07/15/21 1003  ?TempSrc:   ?PainSc: 0-No pain  ? ? ?  ?  ?  ?  ?  ?  ? ?Nolon Nations ? ? ? ? ?

## 2021-07-30 ENCOUNTER — Ambulatory Visit (HOSPITAL_COMMUNITY)
Admission: RE | Admit: 2021-07-30 | Discharge: 2021-07-30 | Disposition: A | Discharge status: Home / Self Care | Payer: Medicare Other | Source: Ambulatory Visit | Attending: Nephrology | Admitting: Nephrology

## 2021-07-30 VITALS — BP 170/104 | HR 99 | Temp 96.4°F

## 2021-07-30 DIAGNOSIS — N170 Acute kidney failure with tubular necrosis: Secondary | ICD-10-CM | POA: Diagnosis not present

## 2021-07-30 DIAGNOSIS — N185 Chronic kidney disease, stage 5: Secondary | ICD-10-CM | POA: Diagnosis present

## 2021-07-30 LAB — IRON AND TIBC
Iron: 39 ug/dL — ABNORMAL LOW (ref 45–182)
Saturation Ratios: 19 % (ref 17.9–39.5)
TIBC: 207 ug/dL — ABNORMAL LOW (ref 250–450)
UIBC: 168 ug/dL

## 2021-07-30 LAB — POCT HEMOGLOBIN-HEMACUE: Hemoglobin: 9.5 g/dL — ABNORMAL LOW (ref 13.0–17.0)

## 2021-07-30 LAB — FERRITIN: Ferritin: 250 ng/mL (ref 24–336)

## 2021-07-30 MED ORDER — DARBEPOETIN ALFA 40 MCG/0.4ML IJ SOSY
PREFILLED_SYRINGE | INTRAMUSCULAR | Status: AC
  Filled 2021-07-30: qty 0.4

## 2021-07-30 MED ORDER — DARBEPOETIN ALFA 40 MCG/0.4ML IJ SOSY
40.0000 ug | PREFILLED_SYRINGE | INTRAMUSCULAR | Status: DC
  Administered 2021-07-30: 40 ug via SUBCUTANEOUS

## 2021-08-07 ENCOUNTER — Other Ambulatory Visit: Payer: Self-pay

## 2021-08-07 DIAGNOSIS — N184 Chronic kidney disease, stage 4 (severe): Secondary | ICD-10-CM

## 2021-08-15 ENCOUNTER — Ambulatory Visit (INDEPENDENT_AMBULATORY_CARE_PROVIDER_SITE_OTHER): Payer: Medicare Other | Admitting: Vascular Surgery

## 2021-08-15 ENCOUNTER — Encounter: Payer: Self-pay | Admitting: Vascular Surgery

## 2021-08-15 ENCOUNTER — Ambulatory Visit (HOSPITAL_COMMUNITY)
Admission: RE | Admit: 2021-08-15 | Discharge: 2021-08-15 | Disposition: A | Discharge status: Home / Self Care | Payer: Medicare Other | Source: Ambulatory Visit | Attending: Vascular Surgery | Admitting: Vascular Surgery

## 2021-08-15 VITALS — BP 165/97 | HR 98 | Temp 97.9°F | Resp 16 | Ht 67.0 in | Wt 186.0 lb

## 2021-08-15 DIAGNOSIS — N184 Chronic kidney disease, stage 4 (severe): Secondary | ICD-10-CM | POA: Diagnosis present

## 2021-08-15 DIAGNOSIS — N186 End stage renal disease: Secondary | ICD-10-CM

## 2021-08-15 DIAGNOSIS — Z992 Dependence on renal dialysis: Secondary | ICD-10-CM

## 2021-08-15 NOTE — Progress Notes (Signed)
?Office Note  ? ?HPI: [name] is a 51 y.o. ([birth year]) male presenting status post 07/15/2021 left arm brachiocephalic fistula creation. ? ?On exam today, [name] was doing well.  He had no complaints.  Symptoms of steal syndrome.  She denied wound healing issues, denied drainage, induration, erythema. ?Currently not on dialysis. ? ?Past Medical History:  ?Diagnosis Date  ? AICD (automatic cardioverter/defibrillator) present   ? pt not longer has this device  ? AKI (acute kidney injury) ([place])   ? Anemia   ? Atrial fibrillation -parosysmal    ? Rx w amiodarone  ? Automatic implantable cardiac defibrillator -BSX   ? single chamber  ? CHF (congestive heart failure) ([place])   ? Chronic kidney disease (CKD)   ? Stage 4  ? Chronic systolic heart failure ([place])   ? 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 COVID 2022  ? Admitted for 7 days  ? Elevated LFTs   ? GERD (gastroesophageal reflux disease)   ? GI bleed -massive   ? 11 Units 2012  ? Gout   ? H/O hyperthyroidism   ? denies  ? Hypertension   ? Noncompliance   ? H/O  MEDICAL NONCOMPLIANCE  ? 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    ? 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 RECURRENT WITH APPROPRIATE SHOCK THERAPY IN THE PAST  ? RA (rheumatoid arthritis) ([place])   ? Severe mitral regurgitation   ? Tricuspid valve regurgitation   ? SEVERE  ? Ventricular fibrillation ([place])   ? WITH APPROPRIATE SHOCK THERAPY IN THE PAST  ? ? ?Past Surgical History:  ?Procedure Laterality Date  ? APPLICATION OF A-CELL OF EXTREMITY N/A 09/05/2015  ? Procedure: APPLICATION OF A-CELL OF sternum;  Surgeon: Loel Lofty Dillingham, DO;  Location: [place];  Service: Plastics;  Laterality: N/A;  ? APPLICATION OF WOUND VAC N/A 08/27/2015  ? Procedure: APPLICATION OF WOUND VAC;  Surgeon: Ivin Poot, MD;  Location: [place];  Service: Thoracic;  Laterality: N/A;  ? APPLICATION OF WOUND VAC N/A 08/29/2015  ? Procedure: WOUND VAC CHANGE;  Surgeon:  Ivin Poot, MD;  Location: [place];  Service: Thoracic;  Laterality: N/A;  ? APPLICATION OF WOUND VAC N/A 09/05/2015  ? Procedure: APPLICATION OF WOUND VAC to sternum;  Surgeon: Loel Lofty Dillingham, DO;  Location: [place];  Service: Plastics;  Laterality: N/A;  ? AV FISTULA PLACEMENT Left 07/15/2021  ? Procedure: LEFT BRACHIOCEPHALIC ARTERIOVENOUS FISTULA CREATION WITH CEPHALIC BRANCH LIGATION;  Surgeon: [name], MD;  Location: [place];  Service: Vascular;  Laterality: Left;  PERIPHERAL NERVE BLOCK  ? CARDIAC CATHETERIZATION  06/2006  ? RIGHT HEART CATH SHOWING SEVERE BIVENTRICUALR CHF WITH MARKED FILLING AND PRESSURES  ? CARDIAC CATHETERIZATION N/A 12/14/2014  ? Procedure: Right Heart Cath;  Surgeon: Jolaine Artist, MD;  Location: [place] [facility];  Service: Cardiovascular;  Laterality: N/A;  ? CARDIAC CATHETERIZATION N/A 12/14/2014  ? Procedure: IABP Insertion;  Surgeon: Jolaine Artist, MD;  Location: [place] CV [facility];  Service: Cardiovascular;  Laterality: N/A;  ? CARDIAC CATHETERIZATION N/A 12/18/2015  ? Procedure: Right Heart Cath;  Surgeon: Jolaine Artist, MD;  Location: [place] [facility];  Service: Cardiovascular;  Laterality: N/A;  ? CHOLECYSTECTOMY    ? ESOPHAGOGASTRODUODENOSCOPY N/A 09/17/2015  ? Procedure: ESOPHAGOGASTRODUODENOSCOPY (EGD);  Surgeon: Carol Ada, MD;  Location: [place];  Service: Gastroenterology;  Laterality: N/A;  ? HEART TRANSPLANT    ?  ILEOSTOMY N/A 09/15/2017  ? Procedure: ILEOSTOMY;  Surgeon: Coralie Keens, MD;  Location: [place];  Service: General;  Laterality: N/A;  ? IMPLANTABLE CARDIOVERTER DEFIBRILLATOR GENERATOR CHANGE N/A 07/01/2011  ? Procedure: IMPLANTABLE CARDIOVERTER DEFIBRILLATOR GENERATOR CHANGE;  Surgeon: Deboraha Sprang, MD;  Location: [facility] CATH LAB;  Service: Cardiovascular;  Laterality: N/A;  ? INCISION AND DRAINAGE OF WOUND N/A 09/05/2015  ? Procedure: IRRIGATION AND DEBRIDEMENT sternal WOUND;  Surgeon: Loel Lofty Dillingham, DO;  Location: [place];   Service: Plastics;  Laterality: N/A;  ? INSERT / REPLACE / REMOVE PACEMAKER    ? GUIDANT HE ICD MODEL 2180, SERIAL # [id number]  ? INSERTION OF DIALYSIS CATHETER Right 09/05/2015  ? Procedure: INSERTION OF DIALYSIS CATHETER;RIGHT SUBCLAVIAN;  Surgeon: Loel Lofty Dillingham, DO;  Location: [place];  Service: Plastics;  Laterality: Right;  ? INSERTION OF IMPLANTABLE LEFT VENTRICULAR ASSIST DEVICE N/A 12/20/2014  ? Procedure: INSERTION OF IMPLANTABLE LEFT VENTRICULAR ASSIST DEVICE;  Surgeon: Ivin Poot, MD;  Location: [place];  Service: Open Heart Surgery;  Laterality: N/A;  CIRC ARREST ? ?NITRIC OXIDE  ? LAPAROTOMY N/A 09/15/2017  ? Procedure: EXPLORATORY LAPAROTOMY; RIGHT HEMICOLECTOMY;  Surgeon: Coralie Keens, MD;  Location: [place];  Service: General;  Laterality: N/A;  ? PECTORALIS FLAP N/A 09/09/2015  ? Procedure: DEBRIDEMENT AND CLOSURE WOUND WITH PLACEMENT OF ABRA CLOSURE DEVICE;  Surgeon: Loel Lofty Dillingham, DO;  Location: [place];  Service: Plastics;  Laterality: N/A;  ? STERNAL CLOSURE N/A 09/17/2015  ? Procedure: ABDOMINAL WOUND CLOSURE WITH INCISIONAL VAC APPLICATION;  Surgeon: Ivin Poot, MD;  Location: [place];  Service: Thoracic;  Laterality: N/A;  ? STERNAL WOUND DEBRIDEMENT N/A 08/27/2015  ? Procedure: WOUND IRRIGATION AND DEBRIDEMENT;  Surgeon: Ivin Poot, MD;  Location: [place];  Service: Thoracic;  Laterality: N/A;  ? STERNAL WOUND DEBRIDEMENT N/A 08/29/2015  ? Procedure: DEBRIDEMENT OF chest wound;  Surgeon: Ivin Poot, MD;  Location: [place];  Service: Thoracic;  Laterality: N/A;  ? TEE WITHOUT CARDIOVERSION N/A 12/12/2014  ? Procedure: TRANSESOPHAGEAL ECHOCARDIOGRAM (TEE);  Surgeon: Jerline Pain, MD;  Location: [place];  Service: Cardiovascular;  Laterality: N/A;  ? TEE WITHOUT CARDIOVERSION N/A 12/20/2014  ? Procedure: TRANSESOPHAGEAL ECHOCARDIOGRAM (TEE);  Surgeon: Ivin Poot, MD;  Location: [place];  Service: Open Heart Surgery;  Laterality: N/A;  ? ? ?Social History  ? ?Socioeconomic  History  ? Marital status: Single  ?  Spouse name: Not on file  ? Number of children: Not on file  ? Years of education: Not on file  ? Highest education level: Not on file  ?Occupational History  ? Occupation: TAX COLLECTOR  ?  Employer: [employer]  ?Tobacco Use  ? Smoking status: Never  ? Smokeless tobacco: Never  ?Vaping Use  ? Vaping Use: Never used  ?Substance and Sexual Activity  ? Alcohol use: No  ? Drug use: No  ? Sexual activity: Not on file  ?Other Topics Concern  ? Not on file  ?Social History Narrative  ? ** Merged History Encounter **  ?    ? LIVES IN [place] ?SINGLE ?TOBACCO USE .Marland KitchenMarland KitchenNO ?ALCOHOL USE.. NO ?REGULAR EXERCISE.. NO ?DRUG USE... NO  ? ?Social Determinants of Health  ? ?Financial Resource Strain: Not on file  ?Food Insecurity: Not on file  ?Transportation Needs: Not on file  ?Physical Activity: Not on file  ?Stress: Not on file  ?Social Connections: Not on file  ?Intimate Partner Violence: Not on file  ? ?  Family History  ?Problem Relation Age of Onset  ? Heart failure Brother   ? ? ?Current Outpatient Medications  ?Medication Sig Dispense Refill  ? calcium carbonate (OSCAL) 1500 (600 Ca) MG TABS tablet Take by mouth 2 (two) times daily with a meal.    ? carvedilol (COREG) 6.25 MG tablet Take 12.5 mg by mouth 2 (two) times daily.    ? citalopram (CELEXA) 40 MG tablet Take 40 mg by mouth daily.    ? ferrous sulfate 325 (65 FE) MG tablet Take 325 mg by mouth 2 (two) times daily with a meal.    ? hydrALAZINE (APRESOLINE) 50 MG tablet Take 50 mg by mouth 3 (three) times daily.    ? mycophenolate (CELLCEPT) 500 MG tablet Take 500 mg by mouth 2 (two) times daily.    ? pantoprazole (PROTONIX) 40 MG tablet Take 40 mg by mouth daily.    ? pravastatin (PRAVACHOL) 40 MG tablet Take 40 mg by mouth daily.  3  ? predniSONE (DELTASONE) 5 MG tablet Take 5 mg by mouth daily.    ? tacrolimus (PROGRAF) 1 MG capsule Take 1 mg by mouth 2 (two) times daily. Takes 2 pills in AM and 1 Pill in PM    ?  HYDROcodone-acetaminophen (NORCO) 5-325 MG tablet Take 1 tablet by mouth every 6 (six) hours as needed for moderate pain. 10 tablet 0  ? ?No current facility-administered medications for this visit.  ? ? ?Allergies  ?All

## 2021-08-22 ENCOUNTER — Other Ambulatory Visit: Payer: Self-pay | Admitting: *Deleted

## 2021-08-22 DIAGNOSIS — Z992 Dependence on renal dialysis: Secondary | ICD-10-CM

## 2021-08-27 ENCOUNTER — Ambulatory Visit (HOSPITAL_COMMUNITY)
Admission: RE | Admit: 2021-08-27 | Discharge: 2021-08-27 | Disposition: A | Discharge status: Home / Self Care | Payer: Medicare Other | Source: Ambulatory Visit | Attending: Nephrology | Admitting: Nephrology

## 2021-08-27 VITALS — BP 159/102 | HR 93 | Temp 97.4°F | Resp 18

## 2021-08-27 DIAGNOSIS — N185 Chronic kidney disease, stage 5: Secondary | ICD-10-CM | POA: Insufficient documentation

## 2021-08-27 DIAGNOSIS — N170 Acute kidney failure with tubular necrosis: Secondary | ICD-10-CM | POA: Insufficient documentation

## 2021-08-27 LAB — IRON AND TIBC
Iron: 35 ug/dL — ABNORMAL LOW (ref 45–182)
Saturation Ratios: 17 % — ABNORMAL LOW (ref 17.9–39.5)
TIBC: 209 ug/dL — ABNORMAL LOW (ref 250–450)
UIBC: 174 ug/dL

## 2021-08-27 LAB — FERRITIN: Ferritin: 245 ng/mL (ref 24–336)

## 2021-08-27 LAB — POCT HEMOGLOBIN-HEMACUE: Hemoglobin: 9.7 g/dL — ABNORMAL LOW (ref 13.0–17.0)

## 2021-08-27 MED ORDER — DARBEPOETIN ALFA 60 MCG/0.3ML IJ SOSY
PREFILLED_SYRINGE | INTRAMUSCULAR | Status: AC
  Administered 2021-08-27: 60 ug via SUBCUTANEOUS
  Filled 2021-08-27: qty 0.3

## 2021-08-27 MED ORDER — DARBEPOETIN ALFA 60 MCG/0.3ML IJ SOSY: 60.0000 ug | PREFILLED_SYRINGE | INTRAMUSCULAR | Status: DC

## 2021-09-10 ENCOUNTER — Ambulatory Visit (HOSPITAL_COMMUNITY)
Admission: RE | Admit: 2021-09-10 | Discharge: 2021-09-10 | Disposition: A | Discharge status: Home / Self Care | Payer: Medicare Other | Source: Ambulatory Visit | Attending: Nephrology | Admitting: Nephrology

## 2021-09-10 VITALS — BP 151/103 | HR 96 | Temp 97.4°F | Resp 18

## 2021-09-10 DIAGNOSIS — N185 Chronic kidney disease, stage 5: Secondary | ICD-10-CM | POA: Insufficient documentation

## 2021-09-10 DIAGNOSIS — N170 Acute kidney failure with tubular necrosis: Secondary | ICD-10-CM | POA: Diagnosis present

## 2021-09-10 LAB — POCT HEMOGLOBIN-HEMACUE: Hemoglobin: 10.5 g/dL — ABNORMAL LOW (ref 13.0–17.0)

## 2021-09-10 MED ORDER — DARBEPOETIN ALFA 60 MCG/0.3ML IJ SOSY
60.0000 ug | PREFILLED_SYRINGE | INTRAMUSCULAR | Status: DC
  Administered 2021-09-10: 60 ug via SUBCUTANEOUS

## 2021-09-10 MED ORDER — DARBEPOETIN ALFA 60 MCG/0.3ML IJ SOSY
PREFILLED_SYRINGE | INTRAMUSCULAR | Status: AC
  Filled 2021-09-10: qty 0.3

## 2021-09-10 NOTE — Progress Notes (Unsigned)
    Postoperative Access Visit   History of Present Illness   [name] is a 51 y.o. year old male who presents for postoperative follow-up for:  Left brachiocephalic fistula creation, cephalic vein branch ligation 07/15/21 by Dr. Virl Cagey.The patient's wounds are well healed.  The patient notes no steal symptoms.  The patient is able to complete their activities of daily living. He was seen post operatively by Dr. Virl Cagey on 08/15/21 at which time duplex evaluation showed mid fistula that was not fully matured. He is not on HD so arm exercises were recommended and he returns today for follow up with repeat fistula ultrasound.     Physical Examination  There were no vitals filed for this visit. There is no height or weight on file to calculate BMI.  left arm Incision is *** healed, *** radial pulse, hand grip is ***/5, sensation in digits is *** intact, ***palpable thrill, bruit can *** be auscultated     Medical Decision Making   [name] is a 51 y.o. year old male who presents s/p Left brachiocephalic fistula creation, cephalic vein branch ligation 07/15/21 by Dr. Virl Cagey.  Patent *** without signs or symptoms of steal syndrome The patient's access will be ready for use ***   Karoline Caldwell, PA-C [facility] Office: [phone]  Clinic MD: Yetta Barre

## 2021-09-18 ENCOUNTER — Encounter (HOSPITAL_COMMUNITY): Payer: Medicare Other

## 2021-09-18 ENCOUNTER — Ambulatory Visit: Payer: Medicare Other

## 2021-09-18 DIAGNOSIS — N184 Chronic kidney disease, stage 4 (severe): Secondary | ICD-10-CM

## 2021-09-24 ENCOUNTER — Ambulatory Visit (HOSPITAL_COMMUNITY)
Admission: RE | Admit: 2021-09-24 | Discharge: 2021-09-24 | Disposition: A | Discharge status: Home / Self Care | Payer: Medicare Other | Source: Ambulatory Visit | Attending: Nephrology | Admitting: Nephrology

## 2021-09-24 ENCOUNTER — Other Ambulatory Visit (HOSPITAL_COMMUNITY): Payer: Self-pay | Admitting: *Deleted

## 2021-09-24 VITALS — BP 160/106 | HR 94 | Temp 97.4°F | Resp 16

## 2021-09-24 DIAGNOSIS — N170 Acute kidney failure with tubular necrosis: Secondary | ICD-10-CM | POA: Insufficient documentation

## 2021-09-24 DIAGNOSIS — N185 Chronic kidney disease, stage 5: Secondary | ICD-10-CM | POA: Diagnosis present

## 2021-09-24 LAB — IRON AND TIBC
Iron: 39 ug/dL — ABNORMAL LOW (ref 45–182)
Saturation Ratios: 19 % (ref 17.9–39.5)
TIBC: 206 ug/dL — ABNORMAL LOW (ref 250–450)
UIBC: 167 ug/dL

## 2021-09-24 LAB — FERRITIN: Ferritin: 157 ng/mL (ref 24–336)

## 2021-09-24 LAB — POCT HEMOGLOBIN-HEMACUE: Hemoglobin: 11.4 g/dL — ABNORMAL LOW (ref 13.0–17.0)

## 2021-09-24 MED ORDER — DARBEPOETIN ALFA 60 MCG/0.3ML IJ SOSY
PREFILLED_SYRINGE | INTRAMUSCULAR | Status: AC
  Administered 2021-09-24: 60 ug via SUBCUTANEOUS
  Filled 2021-09-24: qty 0.3

## 2021-09-24 MED ORDER — DARBEPOETIN ALFA 60 MCG/0.3ML IJ SOSY: 60.0000 ug | PREFILLED_SYRINGE | INTRAMUSCULAR | Status: DC

## 2021-09-29 NOTE — Progress Notes (Signed)
POST OPERATIVE OFFICE NOTE    CC:  F/u for surgery  HPI:  This is a 51 y.o. male who is s/p left BC AVF with vein branch ligation on 07/15/2021 by Dr. Virl Cagey.    Pt was seen on 08/15/2021 and at that time he was doing well.  The mid portion of the fistula was still small at .35cm.  His cephalic vein was sclerotic due to being accessed multiple times for blood draws.  Given he was not on HD, he was brought back for repeat u/s a few weeks later.    Pt states he does not have pain/numbness in left hand.    The pt is not yet on dialysis.  His nephrologist is Dr. Candiss Norse.  He has an appt with him on 7/13.    Allergies  Allergen Reactions   Phytonadione Other (See Comments)    Patient has LVAD: please check with LVAD coordinator on call or LVAD MD on call before reversal of anticoagulation with vit k   Amlodipine Other (See Comments)    Leg swelling    Current Outpatient Medications  Medication Sig Dispense Refill   calcium carbonate (OSCAL) 1500 (600 Ca) MG TABS tablet Take by mouth 2 (two) times daily with a meal.     carvedilol (COREG) 6.25 MG tablet Take 12.5 mg by mouth 2 (two) times daily.     citalopram (CELEXA) 40 MG tablet Take 40 mg by mouth daily.     ferrous sulfate 325 (65 FE) MG tablet Take 325 mg by mouth 2 (two) times daily with a meal.     hydrALAZINE (APRESOLINE) 50 MG tablet Take 50 mg by mouth 3 (three) times daily.     HYDROcodone-acetaminophen (NORCO) 5-325 MG tablet Take 1 tablet by mouth every 6 (six) hours as needed for moderate pain. 10 tablet 0   mycophenolate (CELLCEPT) 500 MG tablet Take 500 mg by mouth 2 (two) times daily.     pantoprazole (PROTONIX) 40 MG tablet Take 40 mg by mouth daily.     pravastatin (PRAVACHOL) 40 MG tablet Take 40 mg by mouth daily.  3   predniSONE (DELTASONE) 5 MG tablet Take 5 mg by mouth daily.     tacrolimus (PROGRAF) 1 MG capsule Take 1 mg by mouth 2 (two) times daily. Takes 2 pills in AM and 1 Pill in PM     No current  facility-administered medications for this visit.     ROS:  See HPI  Physical Exam:  Today's Vitals   10/02/21 0830  BP: (!) 154/97  Pulse: 90  Resp: 20  Temp: 98 F (36.7 C)  TempSrc: Temporal  SpO2: 98%  Weight: 169 lb (76.7 kg)  Height: 5\' 7"  (1.702 m)   Body mass index is 26.47 kg/m.   Incision:  healed Extremities:   There is a palpable left radial pulse.   Motor and sensory are in tact.   There is a thrill/bruit present.  The fistula is easily palpable.  It is pulsatile distally and has a good thrill proximally   Dialysis Duplex on 09/29/2021: +------------+----------+-------------+----------+------------------+  OUTFLOW VEINPSV (cm/s)Diameter (cm)Depth (cm)     Describe       +------------+----------+-------------+----------+------------------+  Shoulder       374        0.45        0.54                       +------------+----------+-------------+----------+------------------+  Prox UA  205        0.56        0.26                       +------------+----------+-------------+----------+------------------+  Mid UA         494        0.37        0.33   change in Diameter  +------------+----------+-------------+----------+------------------+  Dist UA        331        0.59        0.27                       +------------+----------+-------------+----------+------------------+  AC Fossa       183        0.61        0.18                       +------------+----------+-------------+----------+------------------+    Assessment/Plan:  This is a 51 y.o. male who is s/p: left BC AVF with vein branch ligation on 07/15/2021 by Dr. Virl Cagey.    -the pt does not have evidence of steal. -the fistula does have a narrowing in the mid upper arm.  The pt is not yet on HD.  He does have an appt with Dr. Candiss Norse on 7/13.  We will bring him back in 8 weeks and repeat the duplex.  Discussed with pt that we would not do a fistulogram with possible  intervention unless he goes on HD.  Discussed with him that if he needs HD, he could get a catheter.   He expressed understanding and is in agreement with this plan. -discussed with pt that access does not last forever and will need intervention or even new access at some point.  -the pt will follow up in 8 weeks with duplex.  Sooner if he has any issues.      Leontine Locket, [facility] [phone]  Clinic MD:  Scot Dock

## 2021-10-02 ENCOUNTER — Ambulatory Visit (HOSPITAL_COMMUNITY)
Admission: RE | Admit: 2021-10-02 | Discharge: 2021-10-02 | Disposition: A | Discharge status: Home / Self Care | Payer: Medicare Other | Source: Ambulatory Visit | Attending: Surgery | Admitting: Surgery

## 2021-10-02 ENCOUNTER — Ambulatory Visit (INDEPENDENT_AMBULATORY_CARE_PROVIDER_SITE_OTHER): Payer: Medicare Other | Admitting: Physician Assistant

## 2021-10-02 VITALS — BP 154/97 | HR 90 | Temp 98.0°F | Resp 20 | Ht 67.0 in | Wt 169.0 lb

## 2021-10-02 DIAGNOSIS — Z992 Dependence on renal dialysis: Secondary | ICD-10-CM | POA: Diagnosis not present

## 2021-10-02 DIAGNOSIS — N186 End stage renal disease: Secondary | ICD-10-CM | POA: Diagnosis not present

## 2021-10-02 DIAGNOSIS — N184 Chronic kidney disease, stage 4 (severe): Secondary | ICD-10-CM

## 2021-10-07 ENCOUNTER — Other Ambulatory Visit: Payer: Self-pay | Admitting: *Deleted

## 2021-10-07 DIAGNOSIS — N186 End stage renal disease: Secondary | ICD-10-CM

## 2021-10-08 ENCOUNTER — Encounter (HOSPITAL_COMMUNITY): Payer: Medicare Other

## 2021-10-08 ENCOUNTER — Ambulatory Visit (HOSPITAL_COMMUNITY)
Admission: RE | Admit: 2021-10-08 | Discharge: 2021-10-08 | Disposition: A | Discharge status: Home / Self Care | Payer: Medicare Other | Source: Ambulatory Visit | Attending: Nephrology | Admitting: Nephrology

## 2021-10-08 VITALS — BP 130/90 | HR 87 | Temp 97.8°F | Resp 18

## 2021-10-08 DIAGNOSIS — N170 Acute kidney failure with tubular necrosis: Secondary | ICD-10-CM | POA: Diagnosis not present

## 2021-10-08 DIAGNOSIS — N185 Chronic kidney disease, stage 5: Secondary | ICD-10-CM

## 2021-10-08 LAB — POCT HEMOGLOBIN-HEMACUE: Hemoglobin: 11.7 g/dL — ABNORMAL LOW (ref 13.0–17.0)

## 2021-10-08 MED ORDER — DARBEPOETIN ALFA 60 MCG/0.3ML IJ SOSY
60.0000 ug | PREFILLED_SYRINGE | INTRAMUSCULAR | Status: DC
  Administered 2021-10-08: 60 ug via SUBCUTANEOUS

## 2021-10-08 MED ORDER — SODIUM CHLORIDE 0.9 % IV SOLN
750.0000 mg | INTRAVENOUS | Status: DC
  Administered 2021-10-08: 750 mg via INTRAVENOUS
  Filled 2021-10-08: qty 15

## 2021-10-08 MED ORDER — DARBEPOETIN ALFA 60 MCG/0.3ML IJ SOSY
PREFILLED_SYRINGE | INTRAMUSCULAR | Status: AC
  Filled 2021-10-08: qty 0.3

## 2021-10-20 ENCOUNTER — Other Ambulatory Visit (HOSPITAL_COMMUNITY): Payer: Self-pay | Admitting: *Deleted

## 2021-10-20 ENCOUNTER — Other Ambulatory Visit (HOSPITAL_COMMUNITY): Payer: Medicare Other

## 2021-10-20 DIAGNOSIS — Z941 Heart transplant status: Secondary | ICD-10-CM

## 2021-10-20 DIAGNOSIS — I5022 Chronic systolic (congestive) heart failure: Secondary | ICD-10-CM

## 2021-10-22 ENCOUNTER — Ambulatory Visit (HOSPITAL_COMMUNITY)
Admission: RE | Admit: 2021-10-22 | Discharge: 2021-10-22 | Disposition: A | Discharge status: Home / Self Care | Payer: Medicare Other | Source: Ambulatory Visit | Attending: Cardiology | Admitting: Cardiology

## 2021-10-22 ENCOUNTER — Encounter (HOSPITAL_COMMUNITY)
Admission: RE | Admit: 2021-10-22 | Discharge: 2021-10-22 | Disposition: A | Discharge status: Home / Self Care | Payer: Medicare Other | Source: Ambulatory Visit | Attending: Nephrology | Admitting: Nephrology

## 2021-10-22 VITALS — BP 154/103 | HR 92 | Temp 98.1°F | Resp 18

## 2021-10-22 DIAGNOSIS — N170 Acute kidney failure with tubular necrosis: Secondary | ICD-10-CM | POA: Insufficient documentation

## 2021-10-22 DIAGNOSIS — N185 Chronic kidney disease, stage 5: Secondary | ICD-10-CM | POA: Diagnosis present

## 2021-10-22 DIAGNOSIS — Z941 Heart transplant status: Secondary | ICD-10-CM | POA: Insufficient documentation

## 2021-10-22 LAB — BASIC METABOLIC PANEL
Anion gap: 8 (ref 5–15)
BUN: 56 mg/dL — ABNORMAL HIGH (ref 6–20)
CO2: 17 mmol/L — ABNORMAL LOW (ref 22–32)
Calcium: 8.4 mg/dL — ABNORMAL LOW (ref 8.9–10.3)
Chloride: 113 mmol/L — ABNORMAL HIGH (ref 98–111)
Creatinine, Ser: 4.97 mg/dL — ABNORMAL HIGH (ref 0.61–1.24)
GFR, Estimated: 13 mL/min — ABNORMAL LOW (ref >60)
Glucose, Bld: 88 mg/dL (ref 70–99)
Potassium: 4.3 mmol/L (ref 3.5–5.1)
Sodium: 138 mmol/L (ref 135–145)

## 2021-10-22 LAB — POCT HEMOGLOBIN-HEMACUE: Hemoglobin: 11.3 g/dL — ABNORMAL LOW (ref 13.0–17.0)

## 2021-10-22 MED ORDER — DARBEPOETIN ALFA 60 MCG/0.3ML IJ SOSY
PREFILLED_SYRINGE | INTRAMUSCULAR | Status: AC
  Filled 2021-10-22: qty 0.3

## 2021-10-22 MED ORDER — SODIUM CHLORIDE 0.9 % IV SOLN
750.0000 mg | INTRAVENOUS | Status: DC
  Administered 2021-10-22: 750 mg via INTRAVENOUS
  Filled 2021-10-22: qty 15

## 2021-10-22 MED ORDER — DARBEPOETIN ALFA 60 MCG/0.3ML IJ SOSY
60.0000 ug | PREFILLED_SYRINGE | INTRAMUSCULAR | Status: DC
  Administered 2021-10-22: 60 ug via SUBCUTANEOUS

## 2021-10-23 LAB — TACROLIMUS LEVEL: Tacrolimus (FK506) - LabCorp: 4.6 ng/mL (ref 2.0–20.0)

## 2021-10-24 ENCOUNTER — Encounter (HOSPITAL_COMMUNITY): Payer: Self-pay | Admitting: Unknown Physician Specialty

## 2021-10-24 NOTE — Progress Notes (Signed)
Fk 506 and BMET results faxed to [facility] transplant.  Tanda Rockers RN, BSN VAD Coordinator 24/7 Pager [phone]

## 2021-10-31 ENCOUNTER — Telehealth (HOSPITAL_COMMUNITY): Payer: Self-pay | Admitting: *Deleted

## 2021-10-31 NOTE — Telephone Encounter (Signed)
Received fax from [facility] requesting repeat BMET and FK506 level.   Attempted to call patient, no answer. Requested call back to clinic to schedule labs for next week.   Emerson Monte RN [place] Coordinator  Office: [phone]  24/7 Pager: [phone]

## 2021-11-03 ENCOUNTER — Other Ambulatory Visit (HOSPITAL_COMMUNITY): Payer: Self-pay | Admitting: *Deleted

## 2021-11-03 DIAGNOSIS — Z941 Heart transplant status: Secondary | ICD-10-CM

## 2021-11-05 ENCOUNTER — Other Ambulatory Visit (HOSPITAL_COMMUNITY): Payer: Self-pay | Admitting: *Deleted

## 2021-11-05 ENCOUNTER — Ambulatory Visit (HOSPITAL_COMMUNITY)
Admission: RE | Admit: 2021-11-05 | Discharge: 2021-11-05 | Disposition: A | Discharge status: Home / Self Care | Payer: Medicare Other | Source: Ambulatory Visit | Attending: Internal Medicine | Admitting: Internal Medicine

## 2021-11-05 ENCOUNTER — Encounter (HOSPITAL_COMMUNITY)
Admission: RE | Admit: 2021-11-05 | Discharge: 2021-11-05 | Disposition: A | Discharge status: Home / Self Care | Payer: Medicare Other | Source: Ambulatory Visit | Attending: Nephrology | Admitting: Nephrology

## 2021-11-05 ENCOUNTER — Encounter (HOSPITAL_COMMUNITY): Payer: Self-pay | Admitting: *Deleted

## 2021-11-05 VITALS — BP 156/113 | HR 99 | Temp 97.4°F | Resp 18

## 2021-11-05 DIAGNOSIS — N170 Acute kidney failure with tubular necrosis: Secondary | ICD-10-CM | POA: Diagnosis not present

## 2021-11-05 DIAGNOSIS — Z941 Heart transplant status: Secondary | ICD-10-CM | POA: Insufficient documentation

## 2021-11-05 LAB — BASIC METABOLIC PANEL
Anion gap: 9 (ref 5–15)
BUN: 62 mg/dL — ABNORMAL HIGH (ref 6–20)
CO2: 20 mmol/L — ABNORMAL LOW (ref 22–32)
Calcium: 9.8 mg/dL (ref 8.9–10.3)
Chloride: 110 mmol/L (ref 98–111)
Creatinine, Ser: 5.04 mg/dL — ABNORMAL HIGH (ref 0.61–1.24)
GFR, Estimated: 13 mL/min — ABNORMAL LOW (ref >60)
Glucose, Bld: 86 mg/dL (ref 70–99)
Potassium: 5.2 mmol/L — ABNORMAL HIGH (ref 3.5–5.1)
Sodium: 139 mmol/L (ref 135–145)

## 2021-11-05 LAB — POCT HEMOGLOBIN-HEMACUE: Hemoglobin: 12.3 g/dL — ABNORMAL LOW (ref 13.0–17.0)

## 2021-11-05 LAB — IRON AND TIBC
Iron: 88 ug/dL (ref 45–182)
Saturation Ratios: 47 % — ABNORMAL HIGH (ref 17.9–39.5)
TIBC: 188 ug/dL — ABNORMAL LOW (ref 250–450)
UIBC: 100 ug/dL

## 2021-11-05 LAB — FERRITIN: Ferritin: 820 ng/mL — ABNORMAL HIGH (ref 24–336)

## 2021-11-05 MED ORDER — DARBEPOETIN ALFA 60 MCG/0.3ML IJ SOSY: 60.0000 ug | PREFILLED_SYRINGE | INTRAMUSCULAR | Status: DC

## 2021-11-05 NOTE — Progress Notes (Signed)
BMP, Hgb, and iron panel results faxed to [facility] Transplant program.  [facility] lab to ask about tacrolimus level; lab was unable to see, but do have extra lavender tube. Order re-placed; lab will run ([facility]).   Zada Girt RN, [place] Coordinator [phone]

## 2021-11-05 NOTE — Addendum Note (Signed)
Encounter addended by: Lezlie Octave, RN on: 11/05/2021 3:22 PM  Actions taken: Order list changed, Diagnosis association updated, Clinical Note Signed

## 2021-11-07 LAB — TACROLIMUS LEVEL: Tacrolimus (FK506) - LabCorp: 7.8 ng/mL (ref 2.0–20.0)

## 2021-11-19 ENCOUNTER — Encounter (HOSPITAL_COMMUNITY): Payer: Medicare Other

## 2021-12-05 ENCOUNTER — Ambulatory Visit (HOSPITAL_COMMUNITY)
Admission: RE | Admit: 2021-12-05 | Discharge: 2021-12-05 | Disposition: A | Discharge status: Home / Self Care | Payer: Medicare Other | Source: Ambulatory Visit | Attending: Vascular Surgery | Admitting: Vascular Surgery

## 2021-12-05 ENCOUNTER — Ambulatory Visit (INDEPENDENT_AMBULATORY_CARE_PROVIDER_SITE_OTHER): Payer: Medicare Other | Admitting: Physician Assistant

## 2021-12-05 VITALS — BP 133/85 | HR 88 | Temp 98.2°F | Ht 67.0 in | Wt 155.2 lb

## 2021-12-05 DIAGNOSIS — N186 End stage renal disease: Secondary | ICD-10-CM | POA: Diagnosis present

## 2021-12-05 DIAGNOSIS — Z992 Dependence on renal dialysis: Secondary | ICD-10-CM

## 2021-12-05 NOTE — Progress Notes (Signed)
Office Note     CC:  follow up Requesting Provider:  Sandi Mariscal, MD  HPI: [name] is a 51 y.o. (04-Apr-1971) male who presents for evaluation of left brachiocephalic fistula created by Dr. Virl Cagey on 07/15/2021.  He has had several fistula duplexes since creation demonstrating a slow to mature cephalic vein fistula.  He recently was evaluated by his nephrologist who stated his kidney function is stable and he has no closer to HD.  He has a 28-month follow-up interval with his nephrologist.  He denies any leg swelling, decrease in urine output, or confusion or dizziness to suggest uremia.  He denies any steal symptoms in his left hand.   Past Medical History:  Diagnosis Date   AICD (automatic cardioverter/defibrillator) present    pt not longer has this device   AKI (acute kidney injury) ([place])    Anemia    Atrial fibrillation -parosysmal     Rx w amiodarone   Automatic implantable cardiac defibrillator -BSX    single chamber   CHF (congestive heart failure) (HCC)    Chronic kidney disease (CKD)    Stage 4   Chronic systolic heart failure (HCC)    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%)   COVID 2022   Admitted for 7 days   Elevated LFTs    GERD (gastroesophageal reflux disease)    GI bleed -massive    11 Units 2012   Gout    H/O hyperthyroidism    denies   Hypertension    Noncompliance    H/O  MEDICAL NONCOMPLIANCE   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       Polymorphic ventricular tachycardia (HCC)    RECURRENT WITH APPROPRIATE SHOCK THERAPY IN THE PAST   RA (rheumatoid arthritis) (HCC)    Severe mitral regurgitation    Tricuspid valve regurgitation    SEVERE   Ventricular fibrillation ([place])    WITH APPROPRIATE SHOCK THERAPY IN THE PAST    Past Surgical History:  Procedure Laterality Date   APPLICATION OF A-CELL OF EXTREMITY N/A 09/05/2015   Procedure: APPLICATION OF A-CELL OF sternum;  Surgeon: Loel Lofty  Dillingham, DO;  Location: [place];  Service: Plastics;  Laterality: N/A;   APPLICATION OF WOUND VAC N/A 08/27/2015   Procedure: APPLICATION OF WOUND VAC;  Surgeon: Ivin Poot, MD;  Location: [place];  Service: Thoracic;  Laterality: N/A;   APPLICATION OF WOUND VAC N/A 08/29/2015   Procedure: WOUND VAC CHANGE;  Surgeon: Ivin Poot, MD;  Location: [place];  Service: Thoracic;  Laterality: N/A;   APPLICATION OF WOUND VAC N/A 09/05/2015   Procedure: APPLICATION OF WOUND VAC to sternum;  Surgeon: Loel Lofty Dillingham, DO;  Location: [place];  Service: Plastics;  Laterality: N/A;   AV FISTULA PLACEMENT Left 07/15/2021   Procedure: LEFT BRACHIOCEPHALIC ARTERIOVENOUS FISTULA CREATION WITH CEPHALIC BRANCH LIGATION;  Surgeon: [name], MD;  Location: [place];  Service: Vascular;  Laterality: Left;  PERIPHERAL NERVE BLOCK   CARDIAC CATHETERIZATION  06/2006   RIGHT HEART CATH SHOWING SEVERE BIVENTRICUALR CHF WITH MARKED FILLING AND PRESSURES   CARDIAC CATHETERIZATION N/A 12/14/2014   Procedure: Right Heart Cath;  Surgeon: Jolaine Artist, MD;  Location: [place] [facility];  Service: Cardiovascular;  Laterality: N/A;   CARDIAC CATHETERIZATION N/A 12/14/2014   Procedure: IABP Insertion;  Surgeon: Jolaine Artist, MD;  Location: [place] [facility];  Service: Cardiovascular;  Laterality: N/A;   CARDIAC CATHETERIZATION N/A  12/18/2015   Procedure: Right Heart Cath;  Surgeon: Jolaine Artist, MD;  Location: [place] [facility];  Service: Cardiovascular;  Laterality: N/A;   CHOLECYSTECTOMY     ESOPHAGOGASTRODUODENOSCOPY N/A 09/17/2015   Procedure: ESOPHAGOGASTRODUODENOSCOPY (EGD);  Surgeon: Carol Ada, MD;  Location: [place];  Service: Gastroenterology;  Laterality: N/A;   HEART TRANSPLANT     ILEOSTOMY N/A 09/15/2017   Procedure: ILEOSTOMY;  Surgeon: Coralie Keens, MD;  Location: [place];  Service: General;  Laterality: N/A;   IMPLANTABLE CARDIOVERTER DEFIBRILLATOR GENERATOR CHANGE N/A 07/01/2011    Procedure: IMPLANTABLE CARDIOVERTER DEFIBRILLATOR GENERATOR CHANGE;  Surgeon: Deboraha Sprang, MD;  Location: [facility] C[facility];  Service: Cardiovascular;  Laterality: N/A;   INCISION AND DRAINAGE OF WOUND N/A 09/05/2015   Procedure: IRRIGATION AND DEBRIDEMENT sternal WOUND;  Surgeon: Loel Lofty Dillingham, DO;  Location: [place];  Service: Plastics;  Laterality: N/A;   INSERT / REPLACE / [place] HE ICD MODEL 2180, SERIAL # [id number]   INSERTION OF DIALYSIS CATHETER Right 09/05/2015   Procedure: INSERTION OF DIALYSIS CATHETER;RIGHT SUBCLAVIAN;  Surgeon: Loel Lofty Dillingham, DO;  Location: [place];  Service: Plastics;  Laterality: Right;   INSERTION OF IMPLANTABLE LEFT VENTRICULAR ASSIST DEVICE N/A 12/20/2014   Procedure: INSERTION OF IMPLANTABLE LEFT VENTRICULAR ASSIST DEVICE;  Surgeon: Ivin Poot, MD;  Location: [place];  Service: Open Heart Surgery;  Laterality: N/A;  CIRC ARREST  NITRIC OXIDE   LAPAROTOMY N/A 09/15/2017   Procedure: EXPLORATORY LAPAROTOMY; RIGHT HEMICOLECTOMY;  Surgeon: Coralie Keens, MD;  Location: [place];  Service: General;  Laterality: N/A;   PECTORALIS FLAP N/A 09/09/2015   Procedure: DEBRIDEMENT AND CLOSURE WOUND WITH PLACEMENT OF ABRA CLOSURE DEVICE;  Surgeon: Loel Lofty Dillingham, DO;  Location: [place];  Service: Plastics;  Laterality: N/A;   STERNAL CLOSURE N/A 09/17/2015   Procedure: ABDOMINAL WOUND CLOSURE WITH INCISIONAL VAC APPLICATION;  Surgeon: Ivin Poot, MD;  Location: [place];  Service: Thoracic;  Laterality: N/A;   STERNAL WOUND DEBRIDEMENT N/A 08/27/2015   Procedure: WOUND IRRIGATION AND DEBRIDEMENT;  Surgeon: Ivin Poot, MD;  Location: [place];  Service: Thoracic;  Laterality: N/A;   STERNAL WOUND DEBRIDEMENT N/A 08/29/2015   Procedure: DEBRIDEMENT OF chest wound;  Surgeon: Ivin Poot, MD;  Location: [place];  Service: Thoracic;  Laterality: N/A;   TEE WITHOUT CARDIOVERSION N/A 12/12/2014   Procedure: TRANSESOPHAGEAL ECHOCARDIOGRAM (TEE);  Surgeon:  Jerline Pain, MD;  Location: [place];  Service: Cardiovascular;  Laterality: N/A;   TEE WITHOUT CARDIOVERSION N/A 12/20/2014   Procedure: TRANSESOPHAGEAL ECHOCARDIOGRAM (TEE);  Surgeon: Ivin Poot, MD;  Location: [place];  Service: Open Heart Surgery;  Laterality: N/A;    Social History   Socioeconomic History   Marital status: Single    Spouse name: Not on file   Number of children: Not on file   Years of education: Not on file   Highest education level: Not on file  Occupational History   Occupation: TAX COLLECTOR    Employer: [employer]  Tobacco Use   Smoking status: Never    Passive exposure: Never   Smokeless tobacco: Never  Vaping Use   Vaping Use: Never used  Substance and Sexual Activity   Alcohol use: No   Drug use: No   Sexual activity: Not on file  Other Topics Concern   Not on file  Social History Narrative   ** Merged History Encounter **  LIVES IN [place] SINGLE TOBACCO USE .Marland KitchenMarland KitchenNO ALCOHOL USE.Marland Kitchen NO REGULAR EXERCISE.Marland Kitchen NO DRUG USE... NO   Social Determinants of Radio broadcast assistant Strain: Not on file  Food Insecurity: Not on file  Transportation Needs: Not on file  Physical Activity: Not on file  Stress: Not on file  Social Connections: Not on file  Intimate Partner Violence: Not on file    Family History  Problem Relation Age of Onset   Heart failure Brother     Current Outpatient Medications  Medication Sig Dispense Refill   calcium carbonate (OSCAL) 1500 (600 Ca) MG TABS tablet Take by mouth 2 (two) times daily with a meal.     carvedilol (COREG) 6.25 MG tablet Take 12.5 mg by mouth 2 (two) times daily.     citalopram (CELEXA) 40 MG tablet Take 40 mg by mouth daily.     ferrous sulfate 325 (65 FE) MG tablet Take 325 mg by mouth 2 (two) times daily with a meal.     hydrALAZINE (APRESOLINE) 50 MG tablet Take 50 mg by mouth 3 (three) times daily.     mycophenolate (CELLCEPT) 500 MG tablet Take 500 mg by mouth 2 (two)  times daily.     pantoprazole (PROTONIX) 40 MG tablet Take 40 mg by mouth daily.     pravastatin (PRAVACHOL) 40 MG tablet Take 40 mg by mouth daily.  3   predniSONE (DELTASONE) 5 MG tablet Take 5 mg by mouth daily.     tacrolimus (PROGRAF) 1 MG capsule Take 1 mg by mouth 2 (two) times daily. Takes 2 pills in AM and 1 Pill in PM     No current facility-administered medications for this visit.    Allergies  Allergen Reactions   Phytonadione Other (See Comments)    Patient has LVAD: please check with LVAD coordinator on call or LVAD MD on call before reversal of anticoagulation with vit k   Amlodipine Other (See Comments)    Leg swelling     REVIEW OF SYSTEMS:   [X]  denotes positive finding, [ ]  denotes negative finding Cardiac  Comments:  Chest pain or chest pressure:    Shortness of breath upon exertion:    Short of breath when lying flat:    Irregular heart rhythm:        Vascular    Pain in calf, thigh, or hip brought on by ambulation:    Pain in feet at night that wakes you up from your sleep:     Blood clot in your veins:    Leg swelling:         Pulmonary    Oxygen at home:    Productive cough:     Wheezing:         Neurologic    Sudden weakness in arms or legs:     Sudden numbness in arms or legs:     Sudden onset of difficulty speaking or slurred speech:    Temporary loss of vision in one eye:     Problems with dizziness:         Gastrointestinal    Blood in stool:     Vomited blood:         Genitourinary    Burning when urinating:     Blood in urine:        Psychiatric    Major depression:         Hematologic    Bleeding problems:    Problems with blood clotting  too easily:        Skin    Rashes or ulcers:        Constitutional    Fever or chills:      PHYSICAL EXAMINATION:  Vitals:   12/05/21 1102  BP: 133/85  Pulse: 88  Temp: 98.2 F (36.8 C)  TempSrc: Temporal  SpO2: 97%  Weight: 155 lb 3.2 oz (70.4 kg)  Height: 5\' 7"  (1.702 m)     General:  WDWN in NAD; vital signs documented above Gait: Not observed HENT: WNL, normocephalic Pulmonary: normal non-labored breathing , without Rales, rhonchi,  wheezing Cardiac: regular HR Abdomen: soft, NT, no masses Skin: without rashes Vascular Exam/Pulses:  Right Left  Radial 2+ (normal) 2+ (normal)   Extremities: Pulsatile flow throughout cephalic vein in the upper arm with palpable narrowing in the mid upper arm Musculoskeletal: no muscle wasting or atrophy  Neurologic: A&O X 3;  No focal weakness or paresthesias are detected Psychiatric:  The pt has Normal affect.   Non-Invasive Vascular Imaging:   Partial thrombus and narrowing in the mid cephalic vein of the upper arm    ASSESSMENT/PLAN:: 51 y.o. male here for follow up for reevaluation of left brachiocephalic fistula which has been slow to mature since creation in March of this year  -Duplex demonstrates a narrowing/partial thrombus in the mid cephalic vein of the left upper arm which likely indicates impending thrombosis.  Patient's renal function has been stable since surgery and he is no closer to initiating HD based on his account of the office visits with his nephrologist.  We discussed the risk of contrast dye involved in a fistulogram to perform balloon assisted maturation.  Even if balloon angioplasty was successful, the duration of the treatment is worrisome.  Based on discussion with the patient the plan will be to consider new access in the future as he approaches the need for HD.  For now he will follow on an as-needed basis.  If patient's nephrologist would prefer [place] to proceed with no access at this time we can certainly arrange this over the phone without requiring a repeat office visit.   Dagoberto Ligas, PA-C [facility] [phone]  Clinic MD:   Virl Cagey

## 2022-01-09 ENCOUNTER — Other Ambulatory Visit: Payer: Self-pay

## 2022-01-09 ENCOUNTER — Encounter (HOSPITAL_COMMUNITY): Payer: Self-pay

## 2022-01-09 ENCOUNTER — Inpatient Hospital Stay (HOSPITAL_COMMUNITY): Payer: Medicare Other

## 2022-01-09 ENCOUNTER — Inpatient Hospital Stay (HOSPITAL_COMMUNITY)
Admission: EM | Admit: 2022-01-09 | Discharge: 2022-01-14 | DRG: 264 | Disposition: A | Discharge status: Home / Self Care | Payer: Medicare Other | Attending: Internal Medicine | Admitting: Internal Medicine

## 2022-01-09 ENCOUNTER — Emergency Department (HOSPITAL_COMMUNITY): Payer: Medicare Other

## 2022-01-09 DIAGNOSIS — I132 Hypertensive heart and chronic kidney disease with heart failure and with stage 5 chronic kidney disease, or end stage renal disease: Secondary | ICD-10-CM | POA: Diagnosis not present

## 2022-01-09 DIAGNOSIS — Z79899 Other long term (current) drug therapy: Secondary | ICD-10-CM | POA: Diagnosis not present

## 2022-01-09 DIAGNOSIS — I452 Bifascicular block: Secondary | ICD-10-CM | POA: Diagnosis present

## 2022-01-09 DIAGNOSIS — Z888 Allergy status to other drugs, medicaments and biological substances status: Secondary | ICD-10-CM

## 2022-01-09 DIAGNOSIS — Z833 Family history of diabetes mellitus: Secondary | ICD-10-CM

## 2022-01-09 DIAGNOSIS — E781 Pure hyperglyceridemia: Secondary | ICD-10-CM | POA: Diagnosis present

## 2022-01-09 DIAGNOSIS — Z932 Ileostomy status: Secondary | ICD-10-CM

## 2022-01-09 DIAGNOSIS — M069 Rheumatoid arthritis, unspecified: Secondary | ICD-10-CM | POA: Diagnosis present

## 2022-01-09 DIAGNOSIS — E871 Hypo-osmolality and hyponatremia: Secondary | ICD-10-CM | POA: Diagnosis present

## 2022-01-09 DIAGNOSIS — I10 Essential (primary) hypertension: Secondary | ICD-10-CM | POA: Diagnosis present

## 2022-01-09 DIAGNOSIS — M109 Gout, unspecified: Secondary | ICD-10-CM | POA: Diagnosis present

## 2022-01-09 DIAGNOSIS — M1000 Idiopathic gout, unspecified site: Secondary | ICD-10-CM | POA: Diagnosis not present

## 2022-01-09 DIAGNOSIS — D696 Thrombocytopenia, unspecified: Secondary | ICD-10-CM | POA: Diagnosis present

## 2022-01-09 DIAGNOSIS — D631 Anemia in chronic kidney disease: Secondary | ICD-10-CM | POA: Diagnosis present

## 2022-01-09 DIAGNOSIS — E861 Hypovolemia: Secondary | ICD-10-CM | POA: Diagnosis present

## 2022-01-09 DIAGNOSIS — Z941 Heart transplant status: Secondary | ICD-10-CM

## 2022-01-09 DIAGNOSIS — I428 Other cardiomyopathies: Secondary | ICD-10-CM | POA: Diagnosis present

## 2022-01-09 DIAGNOSIS — Z95810 Presence of automatic (implantable) cardiac defibrillator: Secondary | ICD-10-CM | POA: Diagnosis not present

## 2022-01-09 DIAGNOSIS — N186 End stage renal disease: Secondary | ICD-10-CM

## 2022-01-09 DIAGNOSIS — Z8616 Personal history of COVID-19: Secondary | ICD-10-CM

## 2022-01-09 DIAGNOSIS — E1122 Type 2 diabetes mellitus with diabetic chronic kidney disease: Secondary | ICD-10-CM | POA: Diagnosis present

## 2022-01-09 DIAGNOSIS — N184 Chronic kidney disease, stage 4 (severe): Secondary | ICD-10-CM | POA: Diagnosis not present

## 2022-01-09 DIAGNOSIS — I081 Rheumatic disorders of both mitral and tricuspid valves: Secondary | ICD-10-CM | POA: Diagnosis present

## 2022-01-09 DIAGNOSIS — N179 Acute kidney failure, unspecified: Secondary | ICD-10-CM | POA: Diagnosis present

## 2022-01-09 DIAGNOSIS — K219 Gastro-esophageal reflux disease without esophagitis: Secondary | ICD-10-CM | POA: Diagnosis present

## 2022-01-09 DIAGNOSIS — I509 Heart failure, unspecified: Secondary | ICD-10-CM | POA: Diagnosis not present

## 2022-01-09 DIAGNOSIS — I5022 Chronic systolic (congestive) heart failure: Secondary | ICD-10-CM | POA: Diagnosis present

## 2022-01-09 DIAGNOSIS — E860 Dehydration: Secondary | ICD-10-CM | POA: Diagnosis present

## 2022-01-09 DIAGNOSIS — N185 Chronic kidney disease, stage 5: Secondary | ICD-10-CM | POA: Diagnosis not present

## 2022-01-09 DIAGNOSIS — N170 Acute kidney failure with tubular necrosis: Secondary | ICD-10-CM | POA: Diagnosis not present

## 2022-01-09 DIAGNOSIS — Z79624 Long term (current) use of inhibitors of nucleotide synthesis: Secondary | ICD-10-CM

## 2022-01-09 DIAGNOSIS — E872 Acidosis, unspecified: Secondary | ICD-10-CM | POA: Diagnosis present

## 2022-01-09 DIAGNOSIS — Z8249 Family history of ischemic heart disease and other diseases of the circulatory system: Secondary | ICD-10-CM

## 2022-01-09 DIAGNOSIS — I480 Paroxysmal atrial fibrillation: Secondary | ICD-10-CM | POA: Diagnosis present

## 2022-01-09 DIAGNOSIS — I120 Hypertensive chronic kidney disease with stage 5 chronic kidney disease or end stage renal disease: Secondary | ICD-10-CM | POA: Diagnosis not present

## 2022-01-09 DIAGNOSIS — N189 Chronic kidney disease, unspecified: Secondary | ICD-10-CM

## 2022-01-09 DIAGNOSIS — D509 Iron deficiency anemia, unspecified: Secondary | ICD-10-CM | POA: Diagnosis present

## 2022-01-09 LAB — CBC WITH DIFFERENTIAL/PLATELET
Abs Immature Granulocytes: 0.03 10*3/uL (ref 0.00–0.07)
Basophils Absolute: 0 10*3/uL (ref 0.0–0.1)
Basophils Relative: 1 %
Eosinophils Absolute: 0 10*3/uL (ref 0.0–0.5)
Eosinophils Relative: 0 %
HCT: 50.4 % (ref 39.0–52.0)
Hemoglobin: 14.7 g/dL (ref 13.0–17.0)
Immature Granulocytes: 1 %
Lymphocytes Relative: 13 %
Lymphs Abs: 0.7 10*3/uL (ref 0.7–4.0)
MCH: 20.2 pg — ABNORMAL LOW (ref 26.0–34.0)
MCHC: 29.2 g/dL — ABNORMAL LOW (ref 30.0–36.0)
MCV: 69.1 fL — ABNORMAL LOW (ref 80.0–100.0)
Monocytes Absolute: 0.9 10*3/uL (ref 0.1–1.0)
Monocytes Relative: 18 %
Neutro Abs: 3.4 10*3/uL (ref 1.7–7.7)
Neutrophils Relative %: 67 %
Platelets: 118 10*3/uL — ABNORMAL LOW (ref 150–400)
RBC: 7.29 MIL/uL — ABNORMAL HIGH (ref 4.22–5.81)
RDW: 18.9 % — ABNORMAL HIGH (ref 11.5–15.5)
WBC: 5 10*3/uL (ref 4.0–10.5)
nRBC: 0 % (ref 0.0–0.2)

## 2022-01-09 LAB — URINALYSIS, COMPLETE (UACMP) WITH MICROSCOPIC
Bacteria, UA: NONE SEEN
Bilirubin Urine: NEGATIVE
Glucose, UA: NEGATIVE mg/dL
Hgb urine dipstick: NEGATIVE
Ketones, ur: NEGATIVE mg/dL
Nitrite: NEGATIVE
Protein, ur: 30 mg/dL — AB
Specific Gravity, Urine: 1.012 (ref 1.005–1.030)
pH: 5 (ref 5.0–8.0)

## 2022-01-09 LAB — LIPASE, BLOOD: Lipase: 106 U/L — ABNORMAL HIGH (ref 11–51)

## 2022-01-09 LAB — COMPREHENSIVE METABOLIC PANEL
ALT: 6 U/L (ref 0–44)
AST: 21 U/L (ref 15–41)
Albumin: 3.9 g/dL (ref 3.5–5.0)
Alkaline Phosphatase: 152 U/L — ABNORMAL HIGH (ref 38–126)
Anion gap: 24 — ABNORMAL HIGH (ref 5–15)
BUN: 184 mg/dL — ABNORMAL HIGH (ref 6–20)
CO2: 15 mmol/L — ABNORMAL LOW (ref 22–32)
Calcium: 9.1 mg/dL (ref 8.9–10.3)
Chloride: 91 mmol/L — ABNORMAL LOW (ref 98–111)
Creatinine, Ser: 14.28 mg/dL — ABNORMAL HIGH (ref 0.61–1.24)
GFR, Estimated: 4 mL/min — ABNORMAL LOW (ref >60)
Glucose, Bld: 96 mg/dL (ref 70–99)
Potassium: 5 mmol/L (ref 3.5–5.1)
Sodium: 130 mmol/L — ABNORMAL LOW (ref 135–145)
Total Bilirubin: 0.8 mg/dL (ref 0.3–1.2)
Total Protein: 7.1 g/dL (ref 6.5–8.1)

## 2022-01-09 LAB — LIPID PANEL
Cholesterol: 149 mg/dL (ref 0–200)
HDL: 38 mg/dL — ABNORMAL LOW (ref >40)
LDL Cholesterol: 65 mg/dL (ref 0–99)
Total CHOL/HDL Ratio: 3.9 RATIO
Triglycerides: 230 mg/dL — ABNORMAL HIGH (ref <150)
VLDL: 46 mg/dL — ABNORMAL HIGH (ref 0–40)

## 2022-01-09 LAB — HEMOGLOBIN A1C
Hgb A1c MFr Bld: 6.1 % — ABNORMAL HIGH (ref 4.8–5.6)
Mean Plasma Glucose: 128.37 mg/dL

## 2022-01-09 MED ORDER — ONDANSETRON HCL 4 MG/2ML IJ SOLN
4.0000 mg | Freq: Once | INTRAMUSCULAR | Status: AC
  Administered 2022-01-09: 4 mg via INTRAVENOUS
  Filled 2022-01-09: qty 2

## 2022-01-09 MED ORDER — ONDANSETRON HCL 4 MG PO TABS: 4.0000 mg | ORAL_TABLET | Freq: Four times a day (QID) | ORAL | Status: DC | PRN

## 2022-01-09 MED ORDER — PANTOPRAZOLE SODIUM 40 MG PO TBEC
40.0000 mg | DELAYED_RELEASE_TABLET | Freq: Every day | ORAL | Status: DC
  Administered 2022-01-10 – 2022-01-14 (×4): 40 mg via ORAL
  Filled 2022-01-09 (×4): qty 1

## 2022-01-09 MED ORDER — SODIUM CHLORIDE 0.9 % IV BOLUS
1000.0000 mL | Freq: Once | INTRAVENOUS | Status: AC
  Administered 2022-01-09: 1000 mL via INTRAVENOUS

## 2022-01-09 MED ORDER — CARVEDILOL 12.5 MG PO TABS
12.5000 mg | ORAL_TABLET | Freq: Two times a day (BID) | ORAL | Status: DC
  Administered 2022-01-09 – 2022-01-13 (×8): 12.5 mg via ORAL
  Filled 2022-01-09 (×8): qty 1

## 2022-01-09 MED ORDER — MYCOPHENOLATE MOFETIL 250 MG PO CAPS
500.0000 mg | ORAL_CAPSULE | Freq: Two times a day (BID) | ORAL | Status: DC
  Administered 2022-01-09 – 2022-01-14 (×10): 500 mg via ORAL
  Filled 2022-01-09 (×10): qty 2

## 2022-01-09 MED ORDER — TACROLIMUS 1 MG PO CAPS: 1.0000 mg | ORAL_CAPSULE | Freq: Two times a day (BID) | ORAL | Status: DC

## 2022-01-09 MED ORDER — HEPARIN SODIUM (PORCINE) 5000 UNIT/ML IJ SOLN
5000.0000 [IU] | Freq: Three times a day (TID) | INTRAMUSCULAR | Status: DC
Start: 2022-01-09 — End: 2022-01-15
  Administered 2022-01-09 – 2022-01-14 (×14): 5000 [IU] via SUBCUTANEOUS
  Filled 2022-01-09 (×14): qty 1

## 2022-01-09 MED ORDER — ONDANSETRON HCL 4 MG/2ML IJ SOLN
4.0000 mg | Freq: Four times a day (QID) | INTRAMUSCULAR | Status: DC | PRN
  Administered 2022-01-11 – 2022-01-12 (×2): 4 mg via INTRAVENOUS
  Filled 2022-01-09 (×2): qty 2

## 2022-01-09 MED ORDER — CALCIUM CARBONATE 1500 (600 CA) MG PO TABS: 1500.0000 mg | ORAL_TABLET | Freq: Two times a day (BID) | ORAL | Status: DC

## 2022-01-09 MED ORDER — SENNOSIDES-DOCUSATE SODIUM 8.6-50 MG PO TABS: 1.0000 | ORAL_TABLET | Freq: Every evening | ORAL | Status: DC | PRN

## 2022-01-09 MED ORDER — SODIUM CHLORIDE 0.9 % IV SOLN: INTRAVENOUS | Status: DC

## 2022-01-09 MED ORDER — IOHEXOL 9 MG/ML PO SOLN
ORAL | Status: AC
  Administered 2022-01-09: 500 mL
  Filled 2022-01-09: qty 1000

## 2022-01-09 MED ORDER — TACROLIMUS 1 MG PO CAPS
2.0000 mg | ORAL_CAPSULE | Freq: Every day | ORAL | Status: DC
  Administered 2022-01-09 – 2022-01-13 (×5): 2 mg via ORAL
  Filled 2022-01-09 (×5): qty 2

## 2022-01-09 MED ORDER — INSULIN ASPART 100 UNIT/ML IJ SOLN
0.0000 [IU] | Freq: Three times a day (TID) | INTRAMUSCULAR | Status: DC
  Administered 2022-01-10 (×2): 1 [IU] via SUBCUTANEOUS
  Administered 2022-01-13: 2 [IU] via SUBCUTANEOUS

## 2022-01-09 MED ORDER — TACROLIMUS 1 MG PO CAPS
3.0000 mg | ORAL_CAPSULE | Freq: Every day | ORAL | Status: DC
  Administered 2022-01-10 – 2022-01-14 (×5): 3 mg via ORAL
  Filled 2022-01-09 (×5): qty 3

## 2022-01-09 MED ORDER — ACETAMINOPHEN 325 MG PO TABS
650.0000 mg | ORAL_TABLET | Freq: Four times a day (QID) | ORAL | Status: DC | PRN
  Administered 2022-01-10 – 2022-01-13 (×3): 650 mg via ORAL
  Filled 2022-01-09 (×3): qty 2

## 2022-01-09 MED ORDER — HYDRALAZINE HCL 50 MG PO TABS: 50.0000 mg | ORAL_TABLET | Freq: Two times a day (BID) | ORAL | Status: DC

## 2022-01-09 MED ORDER — SODIUM BICARBONATE 650 MG PO TABS
1300.0000 mg | ORAL_TABLET | Freq: Two times a day (BID) | ORAL | Status: DC
  Administered 2022-01-09 – 2022-01-13 (×8): 1300 mg via ORAL
  Filled 2022-01-09 (×9): qty 2

## 2022-01-09 MED ORDER — OXYCODONE-ACETAMINOPHEN 5-325 MG PO TABS: 1.0000 | ORAL_TABLET | Freq: Four times a day (QID) | ORAL | Status: DC | PRN

## 2022-01-09 MED ORDER — HYDRALAZINE HCL 50 MG PO TABS
50.0000 mg | ORAL_TABLET | Freq: Three times a day (TID) | ORAL | Status: DC
  Administered 2022-01-09 – 2022-01-11 (×5): 50 mg via ORAL
  Filled 2022-01-09 (×5): qty 1

## 2022-01-09 MED ORDER — PREDNISONE 5 MG PO TABS
5.0000 mg | ORAL_TABLET | Freq: Every day | ORAL | Status: DC
  Administered 2022-01-09 – 2022-01-13 (×4): 5 mg via ORAL
  Filled 2022-01-09 (×4): qty 1

## 2022-01-09 MED ORDER — CITALOPRAM HYDROBROMIDE 40 MG PO TABS
40.0000 mg | ORAL_TABLET | Freq: Every day | ORAL | Status: DC
  Administered 2022-01-10 – 2022-01-14 (×4): 40 mg via ORAL
  Filled 2022-01-09 (×4): qty 1

## 2022-01-09 MED ORDER — ACETAMINOPHEN 650 MG RE SUPP: 650.0000 mg | Freq: Four times a day (QID) | RECTAL | Status: DC | PRN

## 2022-01-09 MED ORDER — HYDROMORPHONE HCL 2 MG PO TABS
1.0000 mg | ORAL_TABLET | Freq: Four times a day (QID) | ORAL | Status: DC | PRN
  Administered 2022-01-09 – 2022-01-14 (×8): 1 mg via ORAL
  Filled 2022-01-09 (×8): qty 1

## 2022-01-09 MED ORDER — FERROUS SULFATE 325 (65 FE) MG PO TABS
325.0000 mg | ORAL_TABLET | Freq: Two times a day (BID) | ORAL | Status: DC
  Administered 2022-01-09 – 2022-01-11 (×4): 325 mg via ORAL
  Filled 2022-01-09 (×4): qty 1

## 2022-01-09 MED ORDER — PRAVASTATIN SODIUM 40 MG PO TABS
40.0000 mg | ORAL_TABLET | Freq: Every day | ORAL | Status: DC
  Administered 2022-01-09 – 2022-01-14 (×5): 40 mg via ORAL
  Filled 2022-01-09 (×5): qty 1

## 2022-01-09 MED ORDER — HYDRALAZINE HCL 25 MG PO TABS: 50.0000 mg | ORAL_TABLET | Freq: Three times a day (TID) | ORAL | Status: DC

## 2022-01-09 NOTE — Hospital Course (Addendum)
All over stomach is hurting. Dry heaving. Dehydrated. Tired. 2-3 days. No actual vomiting. Drinking water sometimes helps with nausea but comes back up. Only nauseated if drinking. BM normal without blood. Pain migrates around abdomen but does not radiate anywhere and is queasy like. 10/10 pain in the ED, 8/10 prior.Comes and goes. Drinking helps with pain some. Denies appetite. Denies sob now, but did have trouble breathing.Mentions weight loss but cant describe how much, says over days. No myalgia. Crampy foot pain present on the R. No itching. No leg swelling. Endorses some shaking. Denies metallic taste in mouth.  Meds: calcium carbonate (OSCAL) 1500 (600 Ca) MG TABS tablet Take by mouth 2 (two) times daily with a meal. [provider] Needs Review  carvedilol (COREG) 6.25 MG tablet Take 12.5 mg by mouth 2 (two) times daily. [provider] Needs Review  citalopram (CELEXA) 40 MG tablet Take 40 mg by mouth daily. [provider] Needs Review  ferrous sulfate 325 (65 FE) MG tablet Take 325 mg by mouth 2 (two) times daily with a meal. [provider] Needs Review  hydrALAZINE (APRESOLINE) 50 MG tablet Take 50 mg by mouth 3 (three) times daily. [provider] Needs Review  mycophenolate (CELLCEPT) 500 MG tablet Take 500 mg by mouth 2 (two) times daily. [provider] Needs Review  pantoprazole (PROTONIX) 40 MG tablet Take 40 mg by mouth daily. [provider] Needs Review  pravastatin (PRAVACHOL) 40 MG tablet Take 40 mg by mouth daily. [provider] Needs Review  predniSONE (DELTASONE) 5 MG tablet Take 5 mg by mouth daily. [provider] Needs Review  tacrolimus (PROGRAF) 1 MG capsule Take 1 mg by mouth 2 (two) times daily. Takes 2 pills in AM and 1 Pill in PM      Social: Lives at home alone. Now retired. Single with no children.Close relationship with sisters. Denies tobacco use ever. Denies alcohol use ever. No  illicit drug use or unprescribed medication use. No primary care doctor but follows closely with nephrology.  Physical exam MWU:XLKG developed, well nourished, lethargic but laying in bed with no acute distress HEENT: sclera anicteric, no oral candidiasis, mucous membranes dry MW:NUUVOZ sternotomy and left chest wall scarring noted, 2+ radial and pedal pulses bilaterally Pulm:normal work of breathing, LCTAB DGU:[id number] abdominal scar noted, ostomy on the the right side of the abdomen, soft, ptp diffusely, no rebound or guarding,normoactive bowel sounds Extremities:warm and dry, no LE edema, RUE fistula with palpable thrill but no audible bruit Neuro: A&O x4  CODE STATUS: Full code   09/24 Patient is feeling better. He is making urine. Patient reports that the food is not that good. No blood yet. No funny taste in mouth. Patient reports that the Duloxetine is working but not much. Patient is concerned about his feet cramping. Patient denies any leg swelling or any dyspnea.  Patient did have a change of his ostomy bag.Just concerned about his cramping in his feet.   9-26: Patient reports pain with swelling in his wrists and ankles, it feels similar to gout exacerbations that he experienced in the past. He has also lost his appetite. Denies nausea, vomiting, and any metallic taste in mouth. On exam ankle and wrist joints are warm. Discussed plan to start hemodialysis and short course of prednisone for his gout flare.  9-27: Joint pain has improved but still painful in wrists and ankles. Still without appetite. Nephrology informed him of outpatient dialysis chair. Wonders about how to get to/from  dialysis sessions.

## 2022-01-09 NOTE — ED Triage Notes (Signed)
Pt arrived POV from home c/o generalized abdominal pain and N/V/D x2 days. Pt also states he thinks he is dehydrated and feels like he can't breathe.

## 2022-01-09 NOTE — ED Notes (Signed)
U/s at bedside

## 2022-01-09 NOTE — Consult Note (Signed)
Hospital Consult    Reason for Consult: Evaluate permanent access Requesting Physician: Dr. Moshe Cipro MRN #:  [id number]  History of Present Illness: This is a 51 y.o. male being seen in consultation for evaluation for permanent dialysis access.  He underwent left brachiocephalic fistula creation with sidebranch ligation by Dr. Virl Cagey in March of this year.  He has received serial ultrasounds which have demonstrated a slow to mature fistula.  Due to being in CKD stage IV we held off on fistulogram to not worsening kidney disease due to contrast dye exposure.  He presented to the emergency department today with nausea vomiting and shortness of breath.  He is thought to be in renal failure.  Of note he underwent cardiac transplantation in 2019.  Past Medical History:  Diagnosis Date   AICD (automatic cardioverter/defibrillator) present    pt not longer has this device   AKI (acute kidney injury) ([place])    Anemia    Atrial fibrillation -parosysmal     Rx w amiodarone   Automatic implantable cardiac defibrillator -BSX    single chamber   CHF (congestive heart failure) (HCC)    Chronic kidney disease (CKD)    Stage 4   Chronic systolic heart failure (HCC)    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%)   COVID 2022   Admitted for 7 days   Elevated LFTs    GERD (gastroesophageal reflux disease)    GI bleed -massive    11 Units 2012   Gout    H/O hyperthyroidism    denies   Hypertension    Noncompliance    H/O  MEDICAL NONCOMPLIANCE   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       Polymorphic ventricular tachycardia (HCC)    RECURRENT WITH APPROPRIATE SHOCK THERAPY IN THE PAST   RA (rheumatoid arthritis) (HCC)    Severe mitral regurgitation    Tricuspid valve regurgitation    SEVERE   Ventricular fibrillation ([place])    WITH APPROPRIATE SHOCK THERAPY IN THE PAST    Past Surgical History:  Procedure Laterality Date   APPLICATION  OF A-CELL OF EXTREMITY N/A 09/05/2015   Procedure: APPLICATION OF A-CELL OF sternum;  Surgeon: Loel Lofty Dillingham, DO;  Location: [place];  Service: Plastics;  Laterality: N/A;   APPLICATION OF WOUND VAC N/A 08/27/2015   Procedure: APPLICATION OF WOUND VAC;  Surgeon: Ivin Poot, MD;  Location: [place];  Service: Thoracic;  Laterality: N/A;   APPLICATION OF WOUND VAC N/A 08/29/2015   Procedure: WOUND VAC CHANGE;  Surgeon: Ivin Poot, MD;  Location: [place];  Service: Thoracic;  Laterality: N/A;   APPLICATION OF WOUND VAC N/A 09/05/2015   Procedure: APPLICATION OF WOUND VAC to sternum;  Surgeon: Loel Lofty Dillingham, DO;  Location: [place];  Service: Plastics;  Laterality: N/A;   AV FISTULA PLACEMENT Left 07/15/2021   Procedure: LEFT BRACHIOCEPHALIC ARTERIOVENOUS FISTULA CREATION WITH CEPHALIC BRANCH LIGATION;  Surgeon: [name], MD;  Location: [place];  Service: Vascular;  Laterality: Left;  PERIPHERAL NERVE BLOCK   CARDIAC CATHETERIZATION  06/2006   RIGHT HEART CATH SHOWING SEVERE BIVENTRICUALR CHF WITH MARKED FILLING AND PRESSURES   CARDIAC CATHETERIZATION N/A 12/14/2014   Procedure: Right Heart Cath;  Surgeon: Jolaine Artist, MD;  Location: [place] [facility];  Service: Cardiovascular;  Laterality: N/A;   CARDIAC CATHETERIZATION N/A 12/14/2014   Procedure: IABP Insertion;  Surgeon: Jolaine Artist, MD;  Location: [place] [facility];  Service: Cardiovascular;  Laterality: N/A;   CARDIAC CATHETERIZATION N/A 12/18/2015   Procedure: Right Heart Cath;  Surgeon: Jolaine Artist, MD;  Location: [place] [facility];  Service: Cardiovascular;  Laterality: N/A;   CHOLECYSTECTOMY     ESOPHAGOGASTRODUODENOSCOPY N/A 09/17/2015   Procedure: ESOPHAGOGASTRODUODENOSCOPY (EGD);  Surgeon: Carol Ada, MD;  Location: [place];  Service: Gastroenterology;  Laterality: N/A;   HEART TRANSPLANT     ILEOSTOMY N/A 09/15/2017   Procedure: ILEOSTOMY;  Surgeon: Coralie Keens, MD;  Location: [place];  Service:  General;  Laterality: N/A;   IMPLANTABLE CARDIOVERTER DEFIBRILLATOR GENERATOR CHANGE N/A 07/01/2011   Procedure: IMPLANTABLE CARDIOVERTER DEFIBRILLATOR GENERATOR CHANGE;  Surgeon: Deboraha Sprang, MD;  Location: [facility] CATH [facility];  Service: Cardiovascular;  Laterality: N/A;   INCISION AND DRAINAGE OF WOUND N/A 09/05/2015   Procedure: IRRIGATION AND DEBRIDEMENT sternal WOUND;  Surgeon: Loel Lofty Dillingham, DO;  Location: [place];  Service: Plastics;  Laterality: N/A;   INSERT / REPLACE / [place] HE ICD MODEL 2180, SERIAL # [id number]   INSERTION OF DIALYSIS CATHETER Right 09/05/2015   Procedure: INSERTION OF DIALYSIS CATHETER;RIGHT SUBCLAVIAN;  Surgeon: Loel Lofty Dillingham, DO;  Location: [place];  Service: Plastics;  Laterality: Right;   INSERTION OF IMPLANTABLE LEFT VENTRICULAR ASSIST DEVICE N/A 12/20/2014   Procedure: INSERTION OF IMPLANTABLE LEFT VENTRICULAR ASSIST DEVICE;  Surgeon: Ivin Poot, MD;  Location: [place];  Service: Open Heart Surgery;  Laterality: N/A;  CIRC ARREST  NITRIC OXIDE   LAPAROTOMY N/A 09/15/2017   Procedure: EXPLORATORY LAPAROTOMY; RIGHT HEMICOLECTOMY;  Surgeon: Coralie Keens, MD;  Location: [place];  Service: General;  Laterality: N/A;   PECTORALIS FLAP N/A 09/09/2015   Procedure: DEBRIDEMENT AND CLOSURE WOUND WITH PLACEMENT OF ABRA CLOSURE DEVICE;  Surgeon: Loel Lofty Dillingham, DO;  Location: [place];  Service: Plastics;  Laterality: N/A;   STERNAL CLOSURE N/A 09/17/2015   Procedure: ABDOMINAL WOUND CLOSURE WITH INCISIONAL VAC APPLICATION;  Surgeon: Ivin Poot, MD;  Location: [place];  Service: Thoracic;  Laterality: N/A;   STERNAL WOUND DEBRIDEMENT N/A 08/27/2015   Procedure: WOUND IRRIGATION AND DEBRIDEMENT;  Surgeon: Ivin Poot, MD;  Location: [place];  Service: Thoracic;  Laterality: N/A;   STERNAL WOUND DEBRIDEMENT N/A 08/29/2015   Procedure: DEBRIDEMENT OF chest wound;  Surgeon: Ivin Poot, MD;  Location: [place];  Service: Thoracic;  Laterality: N/A;    TEE WITHOUT CARDIOVERSION N/A 12/12/2014   Procedure: TRANSESOPHAGEAL ECHOCARDIOGRAM (TEE);  Surgeon: Jerline Pain, MD;  Location: [place];  Service: Cardiovascular;  Laterality: N/A;   TEE WITHOUT CARDIOVERSION N/A 12/20/2014   Procedure: TRANSESOPHAGEAL ECHOCARDIOGRAM (TEE);  Surgeon: Ivin Poot, MD;  Location: [place];  Service: Open Heart Surgery;  Laterality: N/A;    Allergies  Allergen Reactions   Phytonadione Other (See Comments)    Patient has LVAD: please check with LVAD coordinator on call or LVAD MD on call before reversal of anticoagulation with vit k   Amlodipine Other (See Comments)    Leg swelling    Prior to Admission medications   Medication Sig Start Date End Date Taking? Authorizing Provider  calcium carbonate (OSCAL) 1500 (600 Ca) MG TABS tablet Take by mouth 2 (two) times daily with a meal.    [provider]  carvedilol (COREG) 6.25 MG tablet Take 12.5 mg by mouth 2 (two) times daily. 08/11/20   [provider]  citalopram (CELEXA) 40 MG tablet Take 40 mg by mouth  daily. 10/27/20   [provider]  ferrous sulfate 325 (65 FE) MG tablet Take 325 mg by mouth 2 (two) times daily with a meal.    [provider]  hydrALAZINE (APRESOLINE) 50 MG tablet Take 50 mg by mouth 3 (three) times daily.    [provider]  mycophenolate (CELLCEPT) 500 MG tablet Take 500 mg by mouth 2 (two) times daily. 10/29/20   [provider]  pantoprazole (PROTONIX) 40 MG tablet Take 40 mg by mouth daily.    [provider]  pravastatin (PRAVACHOL) 40 MG tablet Take 40 mg by mouth daily. 08/25/17   [provider]  predniSONE (DELTASONE) 5 MG tablet Take 5 mg by mouth daily. 09/02/20   [provider]  tacrolimus (PROGRAF) 1 MG capsule Take 1 mg by mouth 2 (two) times daily. Takes 2 pills in AM and 1 Pill in PM    [provider]    Social History   Socioeconomic History   Marital status: Single     Spouse name: Not on file   Number of children: Not on file   Years of education: Not on file   Highest education level: Not on file  Occupational History   Occupation: TAX COLLECTOR    Employer: [employer]  Tobacco Use   Smoking status: Never    Passive exposure: Never   Smokeless tobacco: Never  Vaping Use   Vaping Use: Never used  Substance and Sexual Activity   Alcohol use: No   Drug use: No   Sexual activity: Not on file  Other Topics Concern   Not on file  Social History Narrative   ** Merged History Encounter **       LIVES IN [place] SINGLE TOBACCO USE .Marland KitchenMarland KitchenNO ALCOHOL USE.Marland Kitchen NO REGULAR EXERCISE.Marland Kitchen NO DRUG USE... NO   Social Determinants of Radio broadcast assistant Strain: Not on file  Food Insecurity: Not on file  Transportation Needs: Not on file  Physical Activity: Not on file  Stress: Not on file  Social Connections: Not on file  Intimate Partner Violence: Not on file     Family History  Problem Relation Age of Onset   Heart failure Brother     ROS: Otherwise negative unless mentioned in HPI  Physical Examination  Vitals:   01/09/22 1300 01/09/22 1330  BP: (!) 126/96 (!) 135/101  Pulse: 100 (!) 101  Resp: 18 17  Temp:    SpO2: 97% 96%   Body mass index is 23.96 kg/m.  General:  WDWN in NAD Gait: Not observed HENT: WNL, normocephalic Pulmonary: normal non-labored breathing, without Rales, rhonchi,  wheezing Cardiac: regular Abdomen:  soft, NT/ND, no masses Skin: without rashes Vascular Exam/Pulses: palpable radial pulses Extremities: Pulsatile flow through left brachiocephalic fistula to the level of the mid upper arm; no palpable flow beyond this area Musculoskeletal: no muscle wasting or atrophy  Neurologic: A&O X 3;  No focal weakness or paresthesias are detected; speech is fluent/normal Psychiatric:  The pt has Normal affect. Lymph:  Unremarkable  CBC    Component Value Date/Time   WBC 5.0 01/09/2022 1017   RBC 7.29  (H) 01/09/2022 1017   HGB 14.7 01/09/2022 1017   HGB 12.5 (L) 03/02/2017 1025   HCT 50.4 01/09/2022 1017   HCT 40.5 03/02/2017 1025   PLT 118 (L) 01/09/2022 1017   PLT 106 (L) 03/02/2017 1025   MCV 69.1 (L) 01/09/2022 1017   MCV 74.6 (L) 03/02/2017 1025  MCH 20.2 (L) 01/09/2022 1017   MCHC 29.2 (L) 01/09/2022 1017   RDW 18.9 (H) 01/09/2022 1017   RDW 18.9 (H) 03/02/2017 1025   LYMPHSABS 0.7 01/09/2022 1017   LYMPHSABS 1.5 03/02/2017 1025   MONOABS 0.9 01/09/2022 1017   MONOABS 0.2 03/02/2017 1025   EOSABS 0.0 01/09/2022 1017   EOSABS 0.1 03/02/2017 1025   BASOSABS 0.0 01/09/2022 1017   BASOSABS 0.0 03/02/2017 1025    BMET    Component Value Date/Time   NA 130 (L) 01/09/2022 1017   NA 140 03/02/2017 1025   K 5.0 01/09/2022 1017   K 5.0 03/02/2017 1025   CL 91 (L) 01/09/2022 1017   CO2 15 (L) 01/09/2022 1017   CO2 21 (L) 03/02/2017 1025   GLUCOSE 96 01/09/2022 1017   GLUCOSE 90 03/02/2017 1025   BUN 184 (H) 01/09/2022 1017   BUN 33.1 (H) 03/02/2017 1025   CREATININE 14.28 (H) 01/09/2022 1017   CREATININE 1.8 (H) 03/02/2017 1025   CALCIUM 9.1 01/09/2022 1017   CALCIUM 8.5 03/02/2017 1025   GFRNONAA 4 (L) 01/09/2022 1017   GFRAA 18 (L) 10/25/2019 1029    COAGS: Lab Results  Component Value Date   INR 1.02 08/06/2017   INR 2.10 04/15/2017   INR 2.55 04/07/2017   PROTIME 19.9 09/18/2008      ASSESSMENT/PLAN: This is a 51 y.o. male with CKD now progressing towards end-stage renal disease  -Patient underwent left brachiocephalic fistula creation in March of this year however this has been slow to mature.  On exam the fistula seems to have thrombosed in the mid upper arm.  He will require new permanent access.  If patient requires urgent/emergent initiation of HD, he will need a temporary line placed.  Otherwise patient can have a TDC and new access placed early next week.  On-call vascular surgeon Dr. Stanford Breed will evaluate the patient later today and provide further  treatment plans.   Dagoberto Ligas PA-C [facility] [phone]

## 2022-01-09 NOTE — Consult Note (Signed)
[facility] Renal Consultation Note  Requesting MD: Ashok Cordia Indication for Consultation: known advanced CKD-  now with symptomatic uremia   HPI:  [name] is a 51 y.o. male with past medical history significant for a heart transplant in January of 2019-  followed by [facility]-  on cellcept and prograf, HTN, unfortunately has developed stage 5 CKD followed by Dr. Loyal Gambler at [facility]-  presumed secondary to calcineurin inhibitor.  Plans were being made for dialysis ( had AVF placed in March of this year)  and also getting him placed on the kidney transplant list.  Pt last saw Dr. Candiss Norse in August-  crt 5-  he was not uremic.  He says that just over the last 2-3 days he started to feel poorly -  tired, nausea, vomiting and feeling twitchy-  he denies any new meds or any other change in his regimen-  denies missing any meds, denies NSAID use.  Labs here show crt over 14 and BUN 184- bicarb 15, albumin 3.9-  his BP is not low.  Regarding this AVF-  unfortunately it did not mature and will not be usable   Creatinine  Date/Time Value Ref Range Status  03/02/2017 10:25 AM 1.8 (H) 0.7 - 1.3 mg/dL Final  11/03/2016 12:53 PM 1.5 (H) 0.7 - 1.3 mg/dL Final   Creat  Date/Time Value Ref Range Status  02/01/2015 11:32 AM 0.92 0.60 - 1.35 mg/dL Final   Creatinine, Ser  Date/Time Value Ref Range Status  01/09/2022 10:17 AM 14.28 (H) 0.61 - 1.24 mg/dL Final  11/05/2021 10:00 AM 5.04 (H) 0.61 - 1.24 mg/dL Final  10/22/2021 11:42 AM 4.97 (H) 0.61 - 1.24 mg/dL Final  07/15/2021 06:27 AM 6.10 (H) 0.61 - 1.24 mg/dL Final  11/19/2020 10:13 AM 3.89 (H) 0.61 - 1.24 mg/dL Final  11/05/2020 11:29 AM 10.42 (H) 0.61 - 1.24 mg/dL Final  10/25/2019 10:29 AM 4.07 (H) 0.61 - 1.24 mg/dL Final  10/10/2019 10:27 AM 4.17 (H) 0.61 - 1.24 mg/dL Final  12/12/2018 09:26 AM 3.74 (H) 0.61 - 1.24 mg/dL Final  11/08/2018 11:20 AM 4.66 (H) 0.61 - 1.24 mg/dL Final  10/18/2018 11:46 AM 3.83 (H) 0.61 - 1.24 mg/dL Final   09/15/2017 02:50 PM 2.23 (H) 0.61 - 1.24 mg/dL Final  09/15/2017 01:54 AM 2.56 (H) 0.61 - 1.24 mg/dL Final  08/31/2017 09:43 PM 1.72 (H) 0.61 - 1.24 mg/dL Final  08/06/2017 10:10 AM 2.21 (H) 0.61 - 1.24 mg/dL Final  06/11/2017 10:39 AM 1.99 (H) 0.61 - 1.24 mg/dL Final  05/21/2017 04:36 PM 2.31 (H) 0.61 - 1.24 mg/dL Final  04/07/2017 11:43 AM 1.58 (H) 0.61 - 1.24 mg/dL Final  02/02/2017 11:23 AM 1.83 (H) 0.61 - 1.24 mg/dL Final  12/09/2016 11:31 AM 1.86 (H) 0.61 - 1.24 mg/dL Final  10/29/2016 09:17 AM 1.43 (H) 0.61 - 1.24 mg/dL Final  10/15/2016 02:11 PM 1.38 (H) 0.61 - 1.24 mg/dL Final  10/06/2016 11:46 AM 1.61 (H) 0.61 - 1.24 mg/dL Final  08/06/2016 11:31 AM 1.54 (H) 0.61 - 1.24 mg/dL Final  07/01/2016 11:26 AM 1.64 (H) 0.61 - 1.24 mg/dL Final  04/09/2016 01:42 PM 1.91 (H) 0.61 - 1.24 mg/dL Final  04/02/2016 10:00 AM 1.87 (H) 0.61 - 1.24 mg/dL Final  03/05/2016 02:23 PM 1.77 (H) 0.61 - 1.24 mg/dL Final  01/31/2016 10:20 AM 1.52 (H) 0.61 - 1.24 mg/dL Final  12/26/2015 11:23 AM 1.52 (H) 0.61 - 1.24 mg/dL Final  12/18/2015 06:50 AM 2.05 (H) 0.61 - 1.24 mg/dL Final  12/16/2015 02:00 PM 1.61 (H) 0.61 - 1.24 mg/dL Final  12/04/2015 10:00 AM 1.57 (H) 0.61 - 1.24 mg/dL Final  11/18/2015 10:07 AM 1.96 (H) 0.61 - 1.24 mg/dL Final  10/24/2015 12:30 PM 2.58 (H) 0.61 - 1.24 mg/dL Final  10/18/2015 12:16 PM 2.63 (H) 0.61 - 1.24 mg/dL Final  10/09/2015 12:20 PM 4.46 (H) 0.61 - 1.24 mg/dL Final  10/09/2015 10:50 AM 4.52 (H) 0.61 - 1.24 mg/dL Final  10/02/2015 05:15 AM 6.49 (H) 0.61 - 1.24 mg/dL Final  10/01/2015 04:10 AM 6.53 (H) 0.61 - 1.24 mg/dL Final  09/30/2015 04:40 AM 6.42 (H) 0.61 - 1.24 mg/dL Final  09/29/2015 04:25 AM 6.64 (H) 0.61 - 1.24 mg/dL Final  09/28/2015 06:00 AM 6.56 (H) 0.61 - 1.24 mg/dL Final  09/27/2015 03:55 AM 6.70 (H) 0.61 - 1.24 mg/dL Final  09/26/2015 05:20 AM 6.78 (H) 0.61 - 1.24 mg/dL Final  09/25/2015 05:15 AM 6.63 (H) 0.61 - 1.24 mg/dL Final  09/24/2015 04:55 AM  6.50 (H) 0.61 - 1.24 mg/dL Final  09/23/2015 04:40 AM 6.29 (H) 0.61 - 1.24 mg/dL Final  09/22/2015 04:15 AM 5.80 (H) 0.61 - 1.24 mg/dL Final  09/21/2015 04:37 AM 4.79 (H) 0.61 - 1.24 mg/dL Final  09/20/2015 03:51 AM 6.72 (H) 0.61 - 1.24 mg/dL Final  09/19/2015 04:40 AM 5.74 (H) 0.61 - 1.24 mg/dL Final     PMHx:   Past Medical History:  Diagnosis Date   AICD (automatic cardioverter/defibrillator) present    pt not longer has this device   AKI (acute kidney injury) ([place])    Anemia    Atrial fibrillation -parosysmal     Rx w amiodarone   Automatic implantable cardiac defibrillator -BSX    single chamber   CHF (congestive heart failure) ([place])    Chronic kidney disease (CKD)    Stage 4   Chronic systolic heart failure (HCC)    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%)   COVID 2022   Admitted for 7 days   Elevated LFTs    GERD (gastroesophageal reflux disease)    GI bleed -massive    11 Units 2012   Gout    H/O hyperthyroidism    denies   Hypertension    Noncompliance    H/O  MEDICAL NONCOMPLIANCE   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       Polymorphic ventricular tachycardia (HCC)    RECURRENT WITH APPROPRIATE SHOCK THERAPY IN THE PAST   RA (rheumatoid arthritis) ([place])    Severe mitral regurgitation    Tricuspid valve regurgitation    SEVERE   Ventricular fibrillation ([place])    WITH APPROPRIATE SHOCK THERAPY IN THE PAST    Past Surgical History:  Procedure Laterality Date   APPLICATION OF A-CELL OF EXTREMITY N/A 09/05/2015   Procedure: APPLICATION OF A-CELL OF sternum;  Surgeon: Loel Lofty Dillingham, DO;  Location: [place];  Service: Plastics;  Laterality: N/A;   APPLICATION OF WOUND VAC N/A 08/27/2015   Procedure: APPLICATION OF WOUND VAC;  Surgeon: Ivin Poot, MD;  Location: [place];  Service: Thoracic;  Laterality: N/A;   APPLICATION OF WOUND VAC N/A 08/29/2015   Procedure: WOUND VAC CHANGE;  Surgeon: Ivin Poot, MD;  Location: [place];  Service: Thoracic;  Laterality: N/A;   APPLICATION OF WOUND VAC N/A 09/05/2015   Procedure: APPLICATION OF WOUND VAC to sternum;  Surgeon: Loel Lofty Dillingham, DO;  Location: [place];  Service: Plastics;  Laterality: N/A;   AV FISTULA  PLACEMENT Left 07/15/2021   Procedure: LEFT BRACHIOCEPHALIC ARTERIOVENOUS FISTULA CREATION WITH CEPHALIC BRANCH LIGATION;  Surgeon: [name], MD;  Location: [place];  Service: Vascular;  Laterality: Left;  PERIPHERAL NERVE BLOCK   CARDIAC CATHETERIZATION  06/2006   RIGHT HEART CATH SHOWING SEVERE BIVENTRICUALR CHF WITH MARKED FILLING AND PRESSURES   CARDIAC CATHETERIZATION N/A 12/14/2014   Procedure: Right Heart Cath;  Surgeon: Jolaine Artist, MD;  Location: [place] [facility];  Service: Cardiovascular;  Laterality: N/A;   CARDIAC CATHETERIZATION N/A 12/14/2014   Procedure: IABP Insertion;  Surgeon: Jolaine Artist, MD;  Location: [place] [facility];  Service: Cardiovascular;  Laterality: N/A;   CARDIAC CATHETERIZATION N/A 12/18/2015   Procedure: Right Heart Cath;  Surgeon: Jolaine Artist, MD;  Location: [place] [facility];  Service: Cardiovascular;  Laterality: N/A;   CHOLECYSTECTOMY     ESOPHAGOGASTRODUODENOSCOPY N/A 09/17/2015   Procedure: ESOPHAGOGASTRODUODENOSCOPY (EGD);  Surgeon: Carol Ada, MD;  Location: [place];  Service: Gastroenterology;  Laterality: N/A;   HEART TRANSPLANT     ILEOSTOMY N/A 09/15/2017   Procedure: ILEOSTOMY;  Surgeon: Coralie Keens, MD;  Location: [place];  Service: General;  Laterality: N/A;   IMPLANTABLE CARDIOVERTER DEFIBRILLATOR GENERATOR CHANGE N/A 07/01/2011   Procedure: IMPLANTABLE CARDIOVERTER DEFIBRILLATOR GENERATOR CHANGE;  Surgeon: Deboraha Sprang, MD;  Location: [facility] CATH LAB;  Service: Cardiovascular;  Laterality: N/A;   INCISION AND DRAINAGE OF WOUND N/A 09/05/2015   Procedure: IRRIGATION AND DEBRIDEMENT sternal WOUND;  Surgeon: Loel Lofty Dillingham, DO;  Location: [place];  Service:  Plastics;  Laterality: N/A;   INSERT / REPLACE / [place] HE ICD MODEL 2180, SERIAL # [id number]   INSERTION OF DIALYSIS CATHETER Right 09/05/2015   Procedure: INSERTION OF DIALYSIS CATHETER;RIGHT SUBCLAVIAN;  Surgeon: Loel Lofty Dillingham, DO;  Location: [place];  Service: Plastics;  Laterality: Right;   INSERTION OF IMPLANTABLE LEFT VENTRICULAR ASSIST DEVICE N/A 12/20/2014   Procedure: INSERTION OF IMPLANTABLE LEFT VENTRICULAR ASSIST DEVICE;  Surgeon: Ivin Poot, MD;  Location: [place];  Service: Open Heart Surgery;  Laterality: N/A;  CIRC ARREST  NITRIC OXIDE   LAPAROTOMY N/A 09/15/2017   Procedure: EXPLORATORY LAPAROTOMY; RIGHT HEMICOLECTOMY;  Surgeon: Coralie Keens, MD;  Location: [place];  Service: General;  Laterality: N/A;   PECTORALIS FLAP N/A 09/09/2015   Procedure: DEBRIDEMENT AND CLOSURE WOUND WITH PLACEMENT OF ABRA CLOSURE DEVICE;  Surgeon: Loel Lofty Dillingham, DO;  Location: [place];  Service: Plastics;  Laterality: N/A;   STERNAL CLOSURE N/A 09/17/2015   Procedure: ABDOMINAL WOUND CLOSURE WITH INCISIONAL VAC APPLICATION;  Surgeon: Ivin Poot, MD;  Location: [place];  Service: Thoracic;  Laterality: N/A;   STERNAL WOUND DEBRIDEMENT N/A 08/27/2015   Procedure: WOUND IRRIGATION AND DEBRIDEMENT;  Surgeon: Ivin Poot, MD;  Location: [place];  Service: Thoracic;  Laterality: N/A;   STERNAL WOUND DEBRIDEMENT N/A 08/29/2015   Procedure: DEBRIDEMENT OF chest wound;  Surgeon: Ivin Poot, MD;  Location: [place];  Service: Thoracic;  Laterality: N/A;   TEE WITHOUT CARDIOVERSION N/A 12/12/2014   Procedure: TRANSESOPHAGEAL ECHOCARDIOGRAM (TEE);  Surgeon: Jerline Pain, MD;  Location: [place];  Service: Cardiovascular;  Laterality: N/A;   TEE WITHOUT CARDIOVERSION N/A 12/20/2014   Procedure: TRANSESOPHAGEAL ECHOCARDIOGRAM (TEE);  Surgeon: Ivin Poot, MD;  Location: [place];  Service: Open Heart Surgery;  Laterality: N/A;    Family Hx:  Family History  Problem Relation  Age of Onset  Heart failure Brother     Social History:  reports that he has never smoked. He has never been exposed to tobacco smoke. He has never used smokeless tobacco. He reports that he does not drink alcohol and does not use drugs.  Allergies:  Allergies  Allergen Reactions   Phytonadione Other (See Comments)    Patient has LVAD: please check with LVAD coordinator on call or LVAD MD on call before reversal of anticoagulation with vit k   Amlodipine Other (See Comments)    Leg swelling    Medications: Prior to Admission medications   Medication Sig Start Date End Date Taking? Authorizing Provider  calcium carbonate (OSCAL) 1500 (600 Ca) MG TABS tablet Take by mouth 2 (two) times daily with a meal.    [provider]  carvedilol (COREG) 6.25 MG tablet Take 12.5 mg by mouth 2 (two) times daily. 08/11/20   [provider]  citalopram (CELEXA) 40 MG tablet Take 40 mg by mouth daily. 10/27/20   [provider]  ferrous sulfate 325 (65 FE) MG tablet Take 325 mg by mouth 2 (two) times daily with a meal.    [provider]  hydrALAZINE (APRESOLINE) 50 MG tablet Take 50 mg by mouth 3 (three) times daily.    [provider]  mycophenolate (CELLCEPT) 500 MG tablet Take 500 mg by mouth 2 (two) times daily. 10/29/20   [provider]  pantoprazole (PROTONIX) 40 MG tablet Take 40 mg by mouth daily.    [provider]  pravastatin (PRAVACHOL) 40 MG tablet Take 40 mg by mouth daily. 08/25/17   [provider]  predniSONE (DELTASONE) 5 MG tablet Take 5 mg by mouth daily. 09/02/20   [provider]  tacrolimus (PROGRAF) 1 MG capsule Take 1 mg by mouth 2 (two) times daily. Takes 2 pills in AM and 1 Pill in PM    [provider]    I have reviewed the patient's current medications.  Labs:  Results for orders placed or performed during the hospital encounter of 01/09/22 (from the past 48 hour(s))  CBC with  Differential     Status: Abnormal   Collection Time: 01/09/22 10:17 AM  Result Value Ref Range   WBC 5.0 4.0 - 10.5 K/uL   RBC 7.29 (H) 4.22 - 5.81 MIL/uL   Hemoglobin 14.7 13.0 - 17.0 g/dL   HCT 50.4 39.0 - 52.0 %   MCV 69.1 (L) 80.0 - 100.0 fL   MCH 20.2 (L) 26.0 - 34.0 pg   MCHC 29.2 (L) 30.0 - 36.0 g/dL   RDW 18.9 (H) 11.5 - 15.5 %   Platelets 118 (L) 150 - 400 K/uL    Comment: Immature Platelet Fraction may be clinically indicated, consider ordering this additional test [id number] REPEATED TO VERIFY    nRBC 0.0 0.0 - 0.2 %   Neutrophils Relative % 67 %   Neutro Abs 3.4 1.7 - 7.7 K/uL   Lymphocytes Relative 13 %   Lymphs Abs 0.7 0.7 - 4.0 K/uL   Monocytes Relative 18 %   Monocytes Absolute 0.9 0.1 - 1.0 K/uL   Eosinophils Relative 0 %   Eosinophils Absolute 0.0 0.0 - 0.5 K/uL   Basophils Relative 1 %   Basophils Absolute 0.0 0.0 - 0.1 K/uL   Immature Granulocytes 1 %   Abs Immature Granulocytes 0.03 0.00 - 0.07 K/uL    Comment: Performed at [facility], [address] [place], [place] [address]  Comprehensive  metabolic panel     Status: Abnormal   Collection Time: 01/09/22 10:17 AM  Result Value Ref Range   Sodium 130 (L) 135 - 145 mmol/L   Potassium 5.0 3.5 - 5.1 mmol/L   Chloride 91 (L) 98 - 111 mmol/L   CO2 15 (L) 22 - 32 mmol/L   Glucose, Bld 96 70 - 99 mg/dL    Comment: Glucose reference range applies only to samples taken after fasting for at least 8 hours.   BUN 184 (H) 6 - 20 mg/dL   Creatinine, Ser 14.28 (H) 0.61 - 1.24 mg/dL   Calcium 9.1 8.9 - 10.3 mg/dL   Total Protein 7.1 6.5 - 8.1 g/dL   Albumin 3.9 3.5 - 5.0 g/dL   AST 21 15 - 41 U/L   ALT 6 0 - 44 U/L   Alkaline Phosphatase 152 (H) 38 - 126 U/L   Total Bilirubin 0.8 0.3 - 1.2 mg/dL   GFR, Estimated 4 (L) >60 mL/min    Comment: (NOTE) Calculated using the CKD-EPI Creatinine Equation (2021)    Anion gap 24 (H) 5 - 15    Comment: Performed at [facility] [address],  [place], [place] [address]     ROS:  Constitutional: positive for fatigue Gastrointestinal: positive for nausea and vomiting Neurological: positive for tremors  Physical Exam: Vitals:   01/09/22 1300 01/09/22 1330  BP: (!) 126/96 (!) 135/101  Pulse: 100 (!) 101  Resp: 18 17  Temp:    SpO2: 97% 96%     General:  well appearing BM-  NAD-  some twitching HEENT: PERRLA, EOMI, mucous membranes moist  Neck: no JVD Heart: tachy Lungs: mostly clear Abdomen: soft , diffusely tender Extremities: really no peripheral edema Skin: warm and dry Neuro: positive for asterixis Left upper arm AVF-  thin and pulsatile   Assessment/Plan: 51 year old BM s/p heart transplant with also advenced CKD 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  Over last one month now crt is 14-  there does not appear to have been a med change or event that brought this on and he is not hypotensive so that makes me think this is just progression of CKD now to ESRD.  He is having uremic signs and symptoms.  I will check a UA and ultrasound to look for anything that might be reversible but right now am not thinking I will find anything and this likely means he needs to start chronic HD.  He is appropriately upset to hear this news.  The  other issue is that we dont have access.  Have consulted [facility] for ideas-  obviously a catheter is not a great option for a heart transplant pt on IS-  would he be a candidate for an immediate stick AVG ?  He does not NEED HD right now but would be best to start in the next 48 hours or so 2. Hypertension/volume  - he does not appear dry or hypotensive-  continue coreg and hydralazine 3. Metabolic acidosis- part of the renal failure-  since may be some time before can get HD-  will give bicarb oral 4. N/V-  due to uremia-  symptomatic treatment  5. Anemia  - is not issue right  now 5. S/p heart transplant-  continue the prograf, cellcept and pred    Louis Meckel  01/09/2022, 1:52 PM

## 2022-01-09 NOTE — ED Notes (Signed)
ED TO INPATIENT HANDOFF REPORT  ED Nurse Name and Phone #: Caryl Pina RN [phone]  S Name/Age/Gender [name] 51 y.o. male Room/Bed: 001C/001C  Code Status   Code Status: Full Code  Home/SNF/Other Home Patient oriented to: self, place, time, and situation Is this baseline? Yes   Triage Complete: Triage complete  Chief Complaint Acute on chronic renal failure ([place]) [N17.9, N18.9]  Triage Note Pt arrived POV from home c/o generalized abdominal pain and N/V/D x2 days. Pt also states he thinks he is dehydrated and feels like he can't breathe.    Allergies Allergies  Allergen Reactions   Phytonadione Other (See Comments)    Patient has LVAD: please check with LVAD coordinator on call or LVAD MD on call before reversal of anticoagulation with vit k   Amlodipine Other (See Comments)    Leg swelling    Level of Care/Admitting Diagnosis ED Disposition     ED Disposition  [place]: [place] [[id number]]  Level of Care: Telemetry Medical [104]  May admit patient to [facility] or [facility] if equivalent level of care is available:: No  Covid Evaluation: Asymptomatic - no recent exposure (last 10 days) testing not required  Diagnosis: Acute on chronic renal failure [facility]) [[id number]]  Admitting Physician: Aldine Contes [phone]  Attending Physician: Aldine Contes [[id number]]  Certification:: I certify this patient will need inpatient services for at least 2 midnights  Estimated Length of Stay: 3          B Medical/Surgery History Past Medical History:  Diagnosis Date   AICD (automatic cardioverter/defibrillator) present    pt not longer has this device   AKI (acute kidney injury) ([place])    Anemia    Atrial fibrillation -parosysmal     Rx w amiodarone   Automatic implantable cardiac defibrillator -BSX    single chamber   CHF (congestive heart failure) ([place])    Chronic kidney disease (CKD)    Stage 4    Chronic systolic heart failure ([place])    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%)   COVID 2022   Admitted for 7 days   Elevated LFTs    GERD (gastroesophageal reflux disease)    GI bleed -massive    11 Units 2012   Gout    H/O hyperthyroidism    denies   Hypertension    Noncompliance    H/O  MEDICAL NONCOMPLIANCE   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       Polymorphic ventricular tachycardia (HCC)    RECURRENT WITH APPROPRIATE SHOCK THERAPY IN THE PAST   RA (rheumatoid arthritis) ([place])    Severe mitral regurgitation    Tricuspid valve regurgitation    SEVERE   Ventricular fibrillation ([place])    WITH APPROPRIATE SHOCK THERAPY IN THE PAST   Past Surgical History:  Procedure Laterality Date   APPLICATION OF A-CELL OF EXTREMITY N/A 09/05/2015   Procedure: APPLICATION OF A-CELL OF sternum;  Surgeon: Loel Lofty Dillingham, DO;  Location: [place];  Service: Plastics;  Laterality: N/A;   APPLICATION OF WOUND VAC N/A 08/27/2015   Procedure: APPLICATION OF WOUND VAC;  Surgeon: Ivin Poot, MD;  Location: [place];  Service: Thoracic;  Laterality: N/A;   APPLICATION OF WOUND VAC N/A 08/29/2015   Procedure: WOUND VAC CHANGE;  Surgeon: Ivin Poot, MD;  Location: [place];  Service: Thoracic;  Laterality: N/A;   APPLICATION  OF WOUND VAC N/A 09/05/2015   Procedure: APPLICATION OF WOUND VAC to sternum;  Surgeon: Loel Lofty Dillingham, DO;  Location: [place];  Service: Plastics;  Laterality: N/A;   AV FISTULA PLACEMENT Left 07/15/2021   Procedure: LEFT BRACHIOCEPHALIC ARTERIOVENOUS FISTULA CREATION WITH CEPHALIC BRANCH LIGATION;  Surgeon: [name], MD;  Location: [place];  Service: Vascular;  Laterality: Left;  PERIPHERAL NERVE BLOCK   CARDIAC CATHETERIZATION  06/2006   RIGHT HEART CATH SHOWING SEVERE BIVENTRICUALR CHF WITH MARKED FILLING AND PRESSURES   CARDIAC CATHETERIZATION N/A 12/14/2014   Procedure: Right Heart Cath;  Surgeon: Jolaine Artist, MD;  Location: [place] CV LAB;  Service: Cardiovascular;  Laterality: N/A;   CARDIAC CATHETERIZATION N/A 12/14/2014   Procedure: IABP Insertion;  Surgeon: Jolaine Artist, MD;  Location: [place] CV LAB;  Service: Cardiovascular;  Laterality: N/A;   CARDIAC CATHETERIZATION N/A 12/18/2015   Procedure: Right Heart Cath;  Surgeon: Jolaine Artist, MD;  Location: [place] CV LAB;  Service: Cardiovascular;  Laterality: N/A;   CHOLECYSTECTOMY     ESOPHAGOGASTRODUODENOSCOPY N/A 09/17/2015   Procedure: ESOPHAGOGASTRODUODENOSCOPY (EGD);  Surgeon: Carol Ada, MD;  Location: [place];  Service: Gastroenterology;  Laterality: N/A;   HEART TRANSPLANT     ILEOSTOMY N/A 09/15/2017   Procedure: ILEOSTOMY;  Surgeon: Coralie Keens, MD;  Location: [place];  Service: General;  Laterality: N/A;   IMPLANTABLE CARDIOVERTER DEFIBRILLATOR GENERATOR CHANGE N/A 07/01/2011   Procedure: IMPLANTABLE CARDIOVERTER DEFIBRILLATOR GENERATOR CHANGE;  Surgeon: Deboraha Sprang, MD;  Location: [facility] CATH LAB;  Service: Cardiovascular;  Laterality: N/A;   INCISION AND DRAINAGE OF WOUND N/A 09/05/2015   Procedure: IRRIGATION AND DEBRIDEMENT sternal WOUND;  Surgeon: Loel Lofty Dillingham, DO;  Location: [place];  Service: Plastics;  Laterality: N/A;   INSERT / REPLACE / [place] HE ICD MODEL 2180, SERIAL # [id number]   INSERTION OF DIALYSIS CATHETER Right 09/05/2015   Procedure: INSERTION OF DIALYSIS CATHETER;RIGHT SUBCLAVIAN;  Surgeon: Loel Lofty Dillingham, DO;  Location: [place];  Service: Plastics;  Laterality: Right;   INSERTION OF IMPLANTABLE LEFT VENTRICULAR ASSIST DEVICE N/A 12/20/2014   Procedure: INSERTION OF IMPLANTABLE LEFT VENTRICULAR ASSIST DEVICE;  Surgeon: Ivin Poot, MD;  Location: [place];  Service: Open Heart Surgery;  Laterality: N/A;  CIRC ARREST  NITRIC OXIDE   LAPAROTOMY N/A 09/15/2017   Procedure: EXPLORATORY LAPAROTOMY; RIGHT HEMICOLECTOMY;  Surgeon: Coralie Keens, MD;  Location:  [place];  Service: General;  Laterality: N/A;   PECTORALIS FLAP N/A 09/09/2015   Procedure: DEBRIDEMENT AND CLOSURE WOUND WITH PLACEMENT OF ABRA CLOSURE DEVICE;  Surgeon: Loel Lofty Dillingham, DO;  Location: [place];  Service: Plastics;  Laterality: N/A;   STERNAL CLOSURE N/A 09/17/2015   Procedure: ABDOMINAL WOUND CLOSURE WITH INCISIONAL VAC APPLICATION;  Surgeon: Ivin Poot, MD;  Location: [place];  Service: Thoracic;  Laterality: N/A;   STERNAL WOUND DEBRIDEMENT N/A 08/27/2015   Procedure: WOUND IRRIGATION AND DEBRIDEMENT;  Surgeon: Ivin Poot, MD;  Location: [place];  Service: Thoracic;  Laterality: N/A;   STERNAL WOUND DEBRIDEMENT N/A 08/29/2015   Procedure: DEBRIDEMENT OF chest wound;  Surgeon: Ivin Poot, MD;  Location: [place];  Service: Thoracic;  Laterality: N/A;   TEE WITHOUT CARDIOVERSION N/A 12/12/2014   Procedure: TRANSESOPHAGEAL ECHOCARDIOGRAM (TEE);  Surgeon: Jerline Pain, MD;  Location: [place];  Service: Cardiovascular;  Laterality: N/A;   TEE WITHOUT CARDIOVERSION N/A 12/20/2014   Procedure: TRANSESOPHAGEAL ECHOCARDIOGRAM (TEE);  Surgeon: Ivin Poot, MD;  Location: [place];  Service: Open Heart Surgery;  Laterality: N/A;     A IV Location/Drains/Wounds Patient Lines/Drains/Airways Status     Active Line/Drains/Airways     Name Placement date Placement time Site Days   Peripheral IV 01/09/22 20 G 1.88" Right;Anterior Forearm 01/09/22  1146  Forearm  less than 1   Fistula / Graft Left Upper arm Arteriovenous fistula 07/15/21  0813  Upper arm  178   Incision (Closed) 12/20/14 Abdomen 12/20/14  1409  -- 2577   Incision (Closed) 09/17/15 Abdomen Anterior;Medial;Upper 09/17/15  1344  -- 2306   Incision (Closed) 09/15/17 Abdomen Other (Comment) 09/15/17  1323  -- 1577   Incision (Closed) 07/15/21 Arm Left 07/15/21  0725  -- 178            Intake/Output Last 24 hours No intake or output data in the 24 hours ending 01/09/22 1558  Labs/Imaging Results for orders  placed or performed during the hospital encounter of 01/09/22 (from the past 48 hour(s))  CBC with Differential     Status: Abnormal   Collection Time: 01/09/22 10:17 AM  Result Value Ref Range   WBC 5.0 4.0 - 10.5 K/uL   RBC 7.29 (H) 4.22 - 5.81 MIL/uL   Hemoglobin 14.7 13.0 - 17.0 g/dL   HCT 50.4 39.0 - 52.0 %   MCV 69.1 (L) 80.0 - 100.0 fL   MCH 20.2 (L) 26.0 - 34.0 pg   MCHC 29.2 (L) 30.0 - 36.0 g/dL   RDW 18.9 (H) 11.5 - 15.5 %   Platelets 118 (L) 150 - 400 K/uL    Comment: Immature Platelet Fraction may be clinically indicated, consider ordering this additional test [id number] REPEATED TO VERIFY    nRBC 0.0 0.0 - 0.2 %   Neutrophils Relative % 67 %   Neutro Abs 3.4 1.7 - 7.7 K/uL   Lymphocytes Relative 13 %   Lymphs Abs 0.7 0.7 - 4.0 K/uL   Monocytes Relative 18 %   Monocytes Absolute 0.9 0.1 - 1.0 K/uL   Eosinophils Relative 0 %   Eosinophils Absolute 0.0 0.0 - 0.5 K/uL   Basophils Relative 1 %   Basophils Absolute 0.0 0.0 - 0.1 K/uL   Immature Granulocytes 1 %   Abs Immature Granulocytes 0.03 0.00 - 0.07 K/uL    Comment: Performed at [facility], [address], [place], [place] [address]  Comprehensive metabolic panel     Status: Abnormal   Collection Time: 01/09/22 10:17 AM  Result Value Ref Range   Sodium 130 (L) 135 - 145 mmol/L   Potassium 5.0 3.5 - 5.1 mmol/L   Chloride 91 (L) 98 - 111 mmol/L   CO2 15 (L) 22 - 32 mmol/L   Glucose, Bld 96 70 - 99 mg/dL    Comment: Glucose reference range applies only to samples taken after fasting for at least 8 hours.   BUN 184 (H) 6 - 20 mg/dL   Creatinine, Ser 14.28 (H) 0.61 - 1.24 mg/dL   Calcium 9.1 8.9 - 10.3 mg/dL   Total Protein 7.1 6.5 - 8.1 g/dL   Albumin 3.9 3.5 - 5.0 g/dL   AST 21 15 - 41 U/L   ALT 6 0 - 44 U/L   Alkaline Phosphatase 152 (H) 38 - 126 U/L   Total Bilirubin 0.8 0.3 - 1.2 mg/dL   GFR, Estimated 4 (L) >60 mL/min    Comment: (NOTE) Calculated using the CKD-EPI Creatinine  Equation (2021)     Anion gap 24 (H) 5 - 15    Comment: Electrolytes repeated to confirm. Performed at [facility], [place] [address], [place], [place] [address]    US RENAL  Result Date: 01/09/2022 CLINICAL DATA:  Acute kidney injury. EXAM: RENAL / URINARY TRACT ULTRASOUND COMPLETE COMPARISON:  May 12, 2021. FINDINGS: Right Kidney: Renal measurements: 9.0 x 5.7 x 4.2 cm = volume: 113 mL. Increased echogenicity of renal parenchyma is noted. No mass or hydronephrosis visualized. Left Kidney: Renal measurements: 10.5 x 5.5 x 4.6 cm = volume: 199 mL. Increased echogenicity of renal parenchyma is noted. No mass or hydronephrosis visualized. Bladder: Appears normal for degree of bladder distention. Other: None. IMPRESSION: Increased echogenicity of renal parenchyma is noted suggesting medical renal disease. No hydronephrosis or renal obstruction is noted. Electronically Signed   By: Marijo Conception M.D.   On: 01/09/2022 15:17    Pending Labs Unresulted Labs (From admission, onward)     Start     Ordered   01/09/22 1547  HIV Antibody (routine testing w rflx)  (HIV Antibody (Routine testing w reflex) panel)  Once,   R        01/09/22 1552   01/09/22 1408  Urinalysis, Complete w Microscopic Urine, Clean Catch  Once,   URGENT        01/09/22 1407            Vitals/Pain Today's Vitals   01/09/22 1330 01/09/22 1430 01/09/22 1529 01/09/22 1530  BP: (!) 135/101 (!) 150/112    Pulse: (!) 101 (!) 108    Resp: 17 (!) 23    Temp:    97.8 F (36.6 C)  TempSrc:    Oral  SpO2: 96% 96%    Weight:      Height:      PainSc:   0-No pain     Isolation Precautions No active isolations  Medications Medications  sodium bicarbonate tablet 1,300 mg (1,300 mg Oral Given 01/09/22 1528)  heparin injection 5,000 Units (has no administration in time range)  acetaminophen (TYLENOL) tablet 650 mg (has no administration in time range)    Or  acetaminophen (TYLENOL) suppository 650 mg (has no administration in time  range)  senna-docusate (Senokot-S) tablet 1 tablet (has no administration in time range)  ondansetron (ZOFRAN) tablet 4 mg (has no administration in time range)    Or  ondansetron (ZOFRAN) injection 4 mg (has no administration in time range)  sodium chloride 0.9 % bolus 1,000 mL (0 mLs Intravenous Stopped 01/09/22 1318)  ondansetron (ZOFRAN) injection 4 mg (4 mg Intravenous Given 01/09/22 1230)    Mobility walks with person assist Low fall risk   Focused Assessments Renal Assessment Handoff:  Hemodialysis Schedule: Not yet on dialysis, fistula placed Last Hemodialysis date and time:    Restricted appendage: left arm   R Recommendations: See Admitting Provider Note  Report given to:   Additional Notes:

## 2022-01-09 NOTE — ED Provider Notes (Signed)
[facility] EMERGENCY DEPARTMENT Provider Note   CSN: [id number] Arrival date & time: 01/09/22  9449     History  Chief Complaint  Patient presents with   Abdominal Pain   HPI [name] is a 51 y.o. male with h/o heart transplant in 2019 and CKD III presenting for nausea, vomiting, and generalized abdominal pain.  Pain is all over his abdomen.  States that he has been vomiting 3-4 times a day and cannot keep anything down.  He feels weak and tired and "really dehydrated".  Also feels like he cannot breathe.  He is taken Tylenol for symptoms which is helped only a little.  Denies chest pain. denies sick contacts.  Denies recent travel.  Denies fever.   Abdominal Pain Associated symptoms: diarrhea, nausea, shortness of breath and vomiting        Home Medications Prior to Admission medications   Medication Sig Start Date End Date Taking? Authorizing Provider  calcium carbonate (OSCAL) 1500 (600 Ca) MG TABS tablet Take by mouth 2 (two) times daily with a meal.    [provider]  carvedilol (COREG) 6.25 MG tablet Take 12.5 mg by mouth 2 (two) times daily. 08/11/20   [provider]  citalopram (CELEXA) 40 MG tablet Take 40 mg by mouth daily. 10/27/20   [provider]  ferrous sulfate 325 (65 FE) MG tablet Take 325 mg by mouth 2 (two) times daily with a meal.    [provider]  hydrALAZINE (APRESOLINE) 50 MG tablet Take 50 mg by mouth 3 (three) times daily.    [provider]  mycophenolate (CELLCEPT) 500 MG tablet Take 500 mg by mouth 2 (two) times daily. 10/29/20   [provider]  pantoprazole (PROTONIX) 40 MG tablet Take 40 mg by mouth daily.    [provider]  pravastatin (PRAVACHOL) 40 MG tablet Take 40 mg by mouth daily. 08/25/17   [provider]  predniSONE (DELTASONE) 5 MG tablet Take 5 mg by mouth daily. 09/02/20   [provider]  tacrolimus (PROGRAF) 1 MG capsule Take 1 mg  by mouth 2 (two) times daily. Takes 2 pills in AM and 1 Pill in PM    [provider]      Allergies    Phytonadione and Amlodipine    Review of Systems   Review of Systems  Respiratory:  Positive for shortness of breath.   Gastrointestinal:  Positive for abdominal pain, diarrhea, nausea and vomiting.    Physical Exam Updated Vital Signs BP (!) 150/112   Pulse (!) 108   Temp 98 F (36.7 C)   Resp (!) 23   Ht 5\' 7"  (1.702 m)   Wt 69.4 kg   SpO2 96%   BMI 23.96 kg/m  Physical Exam Vitals and nursing note reviewed.  HENT:     Head: Normocephalic and atraumatic.     Mouth/Throat:     Mouth: Mucous membranes are moist.  Eyes:     General:        Right eye: No discharge.        Left eye: No discharge.     Conjunctiva/sclera: Conjunctivae normal.  Cardiovascular:     Rate and Rhythm: Normal rate and regular rhythm.     Pulses: Normal pulses.     Heart sounds: Normal heart sounds.  Pulmonary:     Effort: Pulmonary effort is normal.     Breath sounds: Normal breath sounds.  Chest:  Comments: Midsternal postsurgical scar noted. Abdominal:     General: Abdomen is flat.     Palpations: Abdomen is soft.     Tenderness: There is no abdominal tenderness.     Comments: Colostomy bag noted in the right upper quadrant.  Skin:    General: Skin is warm and dry.  Neurological:     General: No focal deficit present.  Psychiatric:        Mood and Affect: Mood normal.     ED Results / Procedures / Treatments   Labs (all labs ordered are listed, but only abnormal results are displayed) Labs Reviewed  CBC WITH DIFFERENTIAL/PLATELET - Abnormal; Notable for the following components:      Result Value   RBC 7.29 (*)    MCV 69.1 (*)    MCH 20.2 (*)    MCHC 29.2 (*)    RDW 18.9 (*)    Platelets 118 (*)    All other components within normal limits  COMPREHENSIVE METABOLIC PANEL - Abnormal; Notable for the following components:   Sodium 130 (*)    Chloride 91 (*)     CO2 15 (*)    BUN 184 (*)    Creatinine, Ser 14.28 (*)    Alkaline Phosphatase 152 (*)    GFR, Estimated 4 (*)    Anion gap 24 (*)    All other components within normal limits  URINALYSIS, COMPLETE (UACMP) WITH MICROSCOPIC    EKG EKG Interpretation  Date/Time:  Friday January 09 2022 11:22:07 EDT Ventricular Rate:  105 PR Interval:  172 QRS Duration: 144 QT Interval:  399 QTC Calculation: 528 R Axis:   -85 Text Interpretation: Sinus tachycardia Biatrial enlargement RBBB and LAFB Left ventricular hypertrophy Non-specific ST-t changes Confirmed by Lajean Saver [id number] on 01/09/2022 12:18:38 PM  Radiology No results found.  Procedures Procedures    Medications Ordered in ED Medications  sodium bicarbonate tablet 1,300 mg (has no administration in time range)  sodium chloride 0.9 % bolus 1,000 mL (0 mLs Intravenous Stopped 01/09/22 1318)  ondansetron (ZOFRAN) injection 4 mg (4 mg Intravenous Given 01/09/22 1230)    ED Course/ Medical Decision Making/ A&P                           Medical Decision Making Amount and/or Complexity of Data Reviewed Labs: ordered.  Risk Prescription drug management. Decision regarding hospitalization.   This patient presents to the ED for concern of SOB, vomiting, and nausea and diarrhea, this involves a number of treatment options, and is a complaint that carries with it a high risk of complications and morbidity.  The differential diagnosis includes AKI related to hypovolemia, ACS, CHF, PE, and gastroenteritis.   Co morbidities: Discussed in HPI   EMR reviewed including pt PMHx, past surgical history and past visits to ER.  Per chart review patient had AV fistula placed in March of this past year but has not been dialyzed.  Heart transplant performed at [facility].  See HPI for more details   Lab Tests:   I ordered and independently interpreted labs. Labs notable for elevated BUN and creatinine, hyponatremia,  hyperkalemia   Imaging Studies:  No imaging studies ordered for this patient    Cardiac Monitoring:  The patient was maintained on a cardiac monitor.  I personally viewed and interpreted the cardiac monitored which showed an underlying rhythm of: Sinus tachycardia EKG non-ischemic   Medicines ordered:  I ordered medication including  normal saline for volume resuscitation and Zofran for nausea. Reevaluation of the patient after these medicines showed that the patient improved I have reviewed the patients home medicines and have made adjustments as needed    Consults/Attending Physician   I requested consultation with Dr. Rolan Lipa,  and discussed lab and imaging findings as well as pertinent plan - they recommend: Admission to the hospital for ongoing management for AKI and possible need for dialysis.  Also requested consultation with internal medicine team, who accepted patient for admission.   Reevaluation:  After the interventions noted above I re-evaluated patient and found that they have :improved.  Patient stated that nausea was much better.    Problem List / ED Course: Patient presented for nausea vomiting and diarrhea and shortness of breath.  Upon review of his chart, did confirm that he has chronic kidney disease stage III.  Labs did reveal concern for AKI with a creatinine approximately 3 times higher than his baseline.  Started fluids for volume resuscitation and reached out to nephrology who recommended that he be admitted for the AKI and possible need for dialysis.  Treated nausea with Zofran and patient stated that he felt much better.  Did consider treating his hyponatremia but patient had overall reassuring neuro exam with appropriate level of mentation and orientation.  Deferred management to nephrology and hospital team.    Dispostion:  After consideration of the diagnostic results and the patients response to treatment, I feel that the patient would  benefit from admission to the hospital for ongoing treatment for AKI.         Final Clinical Impression(s) / ED Diagnoses Final diagnoses:  AKI (acute kidney injury) [facility])    Rx / [place] Orders ED Discharge Orders     None         Harriet Pho, PA-C 01/09/22 1500    Lajean Saver, MD 01/09/22 [id number]

## 2022-01-09 NOTE — H&P (Signed)
Date: 01/09/2022               Patient Name:  [name] MRN: [id number]  DOB: [birth year] Age / Sex: 51 y.o., male   PCP: Sandi Mariscal, MD         Medical Service: Internal Medicine Teaching Service         Attending Physician: Dr. Aldine Contes, MD    First Contact: Dr. Stormy Fabian Pager: [phone]  Second Contact: Dr. Linwood Dibbles Pager: [phone]       After Hours (After 5p/  First Contact Pager: [phone]  weekends / holidays): Second Contact Pager: [phone]   Chief Complaint: generalized abdominal pain, nausea and vomiting  History of Present Illness: [name] is a 51 year old male with a PMHx of HTN, CKD4, diverticulitis s/p ileostomy in 2020 who presents to the hospital due to two day history of nausea/vomiting and generalized abdominal pain. Patient reports abdominal pain, dehydration, nausea, and dry heaving for 2-3 days. No actual vomiting. Drinking water sometimes helps with nausea but comes back up. He also feels that drinking beverages also aggravate the nausea. BM normal without blood. Pain migrates around abdomen but does not radiate anywhere and characterizes the pain as "queezy". 10/10 pain in the ED, 8/10 prior. The abdominal pain comes and goes. Drinking helps with pain some. Reports poor appetite. Denies sob now, but did have trouble breathing. Mentions weight loss but cant describe how much, says over days. No myalgia. Crampy foot pain present on the R. No itching. No leg swelling. Endorses some shaking. Denies metallic taste in mouth.  In the ED, patient was started on fluids for volume resuscitation and reached out to nephrology who recommended that he be admitted for the AKI and possible need for dialysis.  Treated nausea with Zofran and patient stated that he felt much better.  Labs did reveal concern with creatinine approximately 3 times higher than his baseline. Labs showed elevated creatinine in the 14.00s, Na 130, and CO2 15.  Meds:  calcium  carbonate (OSCAL) 1500 (600 Ca) MG TABS tablet Take by mouth 2 (two) times daily with a meal. [provider] Needs Review  carvedilol (COREG) 6.25 MG tablet Take 12.5 mg by mouth 2 (two) times daily. [provider] Needs Review  citalopram (CELEXA) 40 MG tablet Take 40 mg by mouth daily. [provider] Needs Review  ferrous sulfate 325 (65 FE) MG tablet Take 325 mg by mouth 2 (two) times daily with a meal. [provider] Needs Review  hydrALAZINE (APRESOLINE) 50 MG tablet Take 50 mg by mouth 3 (three) times daily. [provider] Needs Review  mycophenolate (CELLCEPT) 500 MG tablet Take 500 mg by mouth 2 (two) times daily. [provider] Needs Review  pantoprazole (PROTONIX) 40 MG tablet Take 40 mg by mouth daily. [provider] Needs Review  pravastatin (PRAVACHOL) 40 MG tablet Take 40 mg by mouth daily. [provider] Needs Review  predniSONE (DELTASONE) 5 MG tablet Take 5 mg by mouth daily. [provider] Needs Review  tacrolimus (PROGRAF) 1 MG capsule Take 1 mg by mouth 2 (two) times daily. Takes 2 pills in AM and 1 Pill in PM       Allergies: Allergies as of 01/09/2022 - Review Complete 01/09/2022  Allergen Reaction Noted   Phytonadione Other (See Comments) 08/26/2015   Amlodipine Other (See Comments) 04/04/2021   Past Medical History:  Diagnosis Date   AICD (automatic cardioverter/defibrillator) present  pt not longer has this device   AKI (acute kidney injury) ([place])    Anemia    Atrial fibrillation -parosysmal     Rx w amiodarone   Automatic implantable cardiac defibrillator -BSX    single chamber   CHF (congestive heart failure) (HCC)    Chronic kidney disease (CKD)    Stage 4   Chronic systolic heart failure (HCC)    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%)   COVID 2022   Admitted for 7 days   Elevated LFTs    GERD (gastroesophageal reflux disease)    GI bleed -massive     11 Units 2012   Gout    H/O hyperthyroidism    denies   Hypertension    Noncompliance    H/O  MEDICAL NONCOMPLIANCE   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  Social History: Lives at home alone. Now retired. Single with no children. Close relationship with sisters. Denies tobacco use ever. Denies alcohol use ever. No illicit drug use or unprescribed medication use. No primary care doctor but follows closely with nephrology  Review of Systems: A complete ROS was negative except as per HPI.   Physical Exam: Blood pressure (!) 153/99, pulse 100, temperature 97.8 F (36.6 C), temperature source Oral, resp. rate 15, height 5\' 7"  (1.702 m), weight 69.4 kg, SpO2 97 %.  HGD:JMEQ developed, well nourished, lethargic but laying in bed with no acute distress HEENT: sclera anicteric, no oral candidiasis, mucous membranes dry AS:TMHDQQ sternotomy and left chest wall scarring noted, 2+ radial and pedal pulses bilaterally Pulm:normal work of breathing, LCTAB IWL:NLGXQJJ abdominal scar noted, ostomy on the the right side of the abdomen, soft, ptp diffusely, no rebound or guarding,normoactive bowel sounds Extremities:warm and dry, no LE edema, RUE fistula with palpable thrill but no audible bruit Neuro: A&O x4  EKG: Sinus tachycardia Biatrial enlargement RBBB and LAFB Left ventricular hypertrophy Non-specific ST-t changes  Assessment & Plan by Problem: Principal Problem:   Acute on chronic renal failure (HCC)  [name] is a 51 year old male with a PMHx of HTN, CKD4,  diverticulitis s/p ileostomy in 2020 who presents to the hospital due to two day history of nausea/vomiting and generalized abdominal pain and he is admitted due to symptoms most likely secondary to progressive renal failure.  Progressive renal failure Anion gap metabolic acidosis Patient has a history of CKD4 and patient underwent a left brachiocephalic fistula creation in 06/2021. Patient last saw his nephrologist Dr. Candiss Norse in August and he did not have symptoms of uremia at that time. He has not undergone HD in the past. Patient presents to the hospital with a two day history of nausea, vomiting, abdominal pain, muscle cramps. Patient's most recent creatinine is 14.28, increased from 5.04 in July 2023. His presentation is most likely from uremia which is also causing the anion gap metabolic acidosis, bicarbonate is currently 15. At this time, it is unclear what exacerbated his rapidly worsening symptoms in the past few days. Nephrology is following and ordered a UA a  renal u/s so evaluate if there are reversible offending agents. If there is not one found, patient will need to start chronic HD. Access through the fistula may not be an option as this time, nephrology is considering immediate stick AVG. Will order CTAP to evaluate for any abdominal abnormalities. -Nephrology following, appreciate recs -Vascular surgery following, appreciate recs -Pending CTAP without contrast -Start maintenance fluids 0.9% NaCl 100 mL/hr for 20 hours -Start sodium bicarbonate 1300 mg BID -Restart calcium carbonate 1,500 mg BID with meals -Restart protonix 40 mg daily -Trend renal function panel  Hyponatremia Patient's sodium on admission was 130 on admission. This is most likely due to hypovolemia as the patient has not been eating or drinking for the past two days. Gave 1L NS bolus. Patient appeared hypovolemic on exam.  -Start maintenance fluids 0.9% NaCl 100 mL/hr for 20 hours -Trend renal function  panel  History of heart transplant Mild Thrombocytopenia Heart transplant done in 2019. Per chart review,  patient had a history of severe mitral and tricuspid valve regurgitation and ventricular fibrillation. On EKG, patient showed RBBB, LAFB, and LVH. Patient is currently on prednisone, mycophenolate, and tacrolimus. Tacrolimus could be causing the slight decrease in platelet count, now 118 and previously in the 150s. Will discuss if patient needs to be transferred to transplant center for management of HD. -Check tacolimus level -Restart cellcept 500 mg BID -Restart prednisone 5 mg daily -Restart tacrolimus 1 mg BID  Hypertension Patient has a history of HTN, SBP on admission has ranged from 120s-140s, and DBP has ranged from 90s-110s. Patient is tachycardic. Patient is currently on hydralazine and carvedilol. -Restart hydralazine 50 mg TID -Restart coreg 12.5 mg BID -CTM  Anemia of chronic disease History of anemia of chronic disease. Patient currently has microcytosis and hemoglobin is normal. -Restart ferrous sulfate 325 mg BID with meals -Trend CBC  Hypertriglyceridemia Previous lipid panel in 2019 showed elevated triglycerides of 257. Patient is currently on pravastatin. -Restart pravastatin 40 mg daily -Pending lipid panel  Diabetes Patient's last A1c was 7.2 in 2016. Patient is currently not taking medications for DM.  -Pending A1c  -SSI -CBG monitoring  Dispo: Admit patient to Inpatient with expected length of stay greater than 2 midnights.  Signed: Stormy Fabian, MD 01/09/2022, 5:21 PM  Pager: [phone] After 5pm on weekdays and 1pm on weekends: On Call pager: [phone]

## 2022-01-10 DIAGNOSIS — N170 Acute kidney failure with tubular necrosis: Secondary | ICD-10-CM | POA: Diagnosis not present

## 2022-01-10 DIAGNOSIS — D631 Anemia in chronic kidney disease: Secondary | ICD-10-CM | POA: Diagnosis not present

## 2022-01-10 DIAGNOSIS — E872 Acidosis, unspecified: Secondary | ICD-10-CM | POA: Diagnosis not present

## 2022-01-10 DIAGNOSIS — N186 End stage renal disease: Secondary | ICD-10-CM | POA: Diagnosis not present

## 2022-01-10 DIAGNOSIS — I120 Hypertensive chronic kidney disease with stage 5 chronic kidney disease or end stage renal disease: Secondary | ICD-10-CM

## 2022-01-10 LAB — RENAL FUNCTION PANEL
Albumin: 3.2 g/dL — ABNORMAL LOW (ref 3.5–5.0)
Anion gap: 16 — ABNORMAL HIGH (ref 5–15)
BUN: 163 mg/dL — ABNORMAL HIGH (ref 6–20)
CO2: 19 mmol/L — ABNORMAL LOW (ref 22–32)
Calcium: 8 mg/dL — ABNORMAL LOW (ref 8.9–10.3)
Chloride: 102 mmol/L (ref 98–111)
Creatinine, Ser: 12.82 mg/dL — ABNORMAL HIGH (ref 0.61–1.24)
GFR, Estimated: 4 mL/min — ABNORMAL LOW (ref >60)
Glucose, Bld: 112 mg/dL — ABNORMAL HIGH (ref 70–99)
Phosphorus: 8 mg/dL — ABNORMAL HIGH (ref 2.5–4.6)
Potassium: 4.8 mmol/L (ref 3.5–5.1)
Sodium: 137 mmol/L (ref 135–145)

## 2022-01-10 LAB — HIV ANTIBODY (ROUTINE TESTING W REFLEX): HIV Screen 4th Generation wRfx: NONREACTIVE

## 2022-01-10 LAB — GLUCOSE, CAPILLARY
Glucose-Capillary: 100 mg/dL — ABNORMAL HIGH (ref 70–99)
Glucose-Capillary: 109 mg/dL — ABNORMAL HIGH (ref 70–99)
Glucose-Capillary: 123 mg/dL — ABNORMAL HIGH (ref 70–99)
Glucose-Capillary: 126 mg/dL — ABNORMAL HIGH (ref 70–99)
Glucose-Capillary: 127 mg/dL — ABNORMAL HIGH (ref 70–99)

## 2022-01-10 MED ORDER — CYCLOBENZAPRINE HCL 5 MG PO TABS
5.0000 mg | ORAL_TABLET | Freq: Two times a day (BID) | ORAL | Status: DC | PRN
  Administered 2022-01-11: 5 mg via ORAL
  Filled 2022-01-10: qty 1

## 2022-01-10 NOTE — Progress Notes (Signed)
Subjective:  stable overnight-  tells me that his stomach is much better-  no N/V-  is having some hand and feet cramping that he would like med for BUN and crt both down but still very abnormal-  appreciate [facility] plan   Objective Vital signs in last 24 hours: Vitals:   01/09/22 1731 01/09/22 1821 01/09/22 2241 01/10/22 0900  BP:  (!) 121/101 110/80 112/82  Pulse:  100 99 98  Resp:  19 16 18   Temp: 98.1 F (36.7 C) 97.9 F (36.6 C) 98.4 F (36.9 C) 98.6 F (37 C)  TempSrc: Oral  Oral Oral  SpO2:   100% 99%  Weight:  63.3 kg    Height:  5\' 7"  (1.702 m)     Weight change:   Intake/Output Summary (Last 24 hours) at 01/10/2022 0945 Last data filed at 01/10/2022 5701 Gross per 24 hour  Intake 1819.88 ml  Output 1000 ml  Net 819.88 ml    Assessment/Plan: 51 year old BM s/p heart transplant with also advenced CKD 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  Over last one month now crt is 14-  there does not appear to have been a med change or event that brought this on and he is not hypotensive so that makes me think this is just progression of CKD now to ESRD.  He is having uremic signs and symptoms.   UA and ultrasound not showing anything reversible - labs are a little bit better but  this likely means he needs to start chronic HD.  He is appropriately upset to hear this news.  The  other issue is that we dont have access.  Have consulted [facility] for ideas-  they think he would be a candidate for an immediate stick AVG ?  tentatively plan for Monday-  unless his numbers improve remarkably in the next 48 hours we will proceed to start 2. Hypertension/volume  - he does not appear dry or hypotensive-  continue coreg and hydralazine.  Need to be careful about hydrating too aggressively with his CHF-  now that GI is better maybe he can hydrate on own 3. Metabolic acidosis- part of the  renal failure-  since may be some time before can get HD-  will give bicarb oral until we can get HD done  4. N/V-  due to uremia-  symptomatic treatment  5. Anemia  - is not issue right now 5. S/p heart transplant-  continue the prograf, cellcept and pred     Louis Meckel    Labs: Basic Metabolic Panel: Recent Labs  Lab 01/09/22 1017 01/10/22 0750  NA 130* 137  K 5.0 4.8  CL 91* 102  CO2 15* 19*  GLUCOSE 96 112*  BUN 184* 163*  CREATININE 14.28* 12.82*  CALCIUM 9.1 8.0*  PHOS  --  8.0*   Liver Function Tests: Recent Labs  Lab 01/09/22 1017  AST 21  ALT 6  ALKPHOS 152*  BILITOT 0.8  PROT 7.1  ALBUMIN 3.9   Recent Labs  Lab 01/09/22 1953  LIPASE 106*   No results for input(s): "AMMONIA" in the last 168 hours. CBC: Recent Labs  Lab 01/09/22 1017  WBC 5.0  NEUTROABS 3.4  HGB 14.7  HCT 50.4  MCV 69.1*  PLT 118*   Cardiac Enzymes: No results for input(s): "CKTOTAL", "CKMB", "CKMBINDEX", "TROPONINI" in the  last 168 hours. CBG: Recent Labs  Lab 01/10/22 0039 01/10/22 0740  GLUCAP 109* 127*    Iron Studies: No results for input(s): "IRON", "TIBC", "TRANSFERRIN", "FERRITIN" in the last 72 hours. Studies/Results: CT ABDOMEN PELVIS WO CONTRAST  Result Date: 01/09/2022 CLINICAL DATA:  Nausea/vomiting, abdominal pain, acute, nonlocalized EXAM: CT ABDOMEN AND PELVIS WITHOUT CONTRAST TECHNIQUE: Multidetector CT imaging of the abdomen and pelvis was performed following the standard protocol without IV contrast. RADIATION DOSE REDUCTION: This exam was performed according to the departmental dose-optimization program which includes automated exposure control, adjustment of the mA and/or kV according to patient size and/or use of iterative reconstruction technique. COMPARISON:  11/05/2020 FINDINGS: Lower chest: Surgical changes of probable cardiac transplantation are again noted. No pericardial effusion. Visualized lung bases are clear. Hepatobiliary: No  focal liver abnormality is seen. No gallstones, gallbladder wall thickening, or biliary dilatation. Pancreas: Unremarkable save for a duodenal diverticulum within the pancreatic head. Spleen: Unremarkable Adrenals/Urinary Tract: Adrenal glands are unremarkable. Kidneys are normal, without renal calculi, focal lesion, or hydronephrosis. Bladder is unremarkable. Stomach/Bowel: Surgical changes of right hemicolectomy and right lower quadrant and ileostomy are again identified. Small parastomal hernia is again identified with multiple loops of terminal ileum. No evidence of obstruction or focal inflammation. There is moderate diverticulosis noted of the residual transverse and descending colon. No free intraperitoneal gas or fluid. Vascular/Lymphatic: Aortic atherosclerosis. No enlarged abdominal or pelvic lymph nodes. Reproductive: Prostate is unremarkable. Other: Tiny fat containing umbilical hernia. Infiltration with subcutaneous gas within the left lower quadrant intra-abdominal wall may reflect changes related to subcutaneous injection. Musculoskeletal: No acute bone abnormality. No lytic or blastic bone lesion. IMPRESSION: 1. No acute intra-abdominal pathology identified. No definite radiographic explanation for the patient's reported symptoms. 2. Surgical changes of right hemicolectomy and right lower quadrant end ileostomy. Small parastomal hernia containing multiple loops of terminal ileum without evidence of obstruction or focal inflammation. 3. Moderate diverticulosis of the residual transverse and descending colon. Aortic Atherosclerosis (ICD10-I70.0). Electronically Signed   By: Fidela Salisbury M.D.   On: 01/09/2022 22:22   US RENAL  Result Date: 01/09/2022 CLINICAL DATA:  Acute kidney injury. EXAM: RENAL / URINARY TRACT ULTRASOUND COMPLETE COMPARISON:  May 12, 2021. FINDINGS: Right Kidney: Renal measurements: 9.0 x 5.7 x 4.2 cm = volume: 113 mL. Increased echogenicity of renal parenchyma is noted.  No mass or hydronephrosis visualized. Left Kidney: Renal measurements: 10.5 x 5.5 x 4.6 cm = volume: 199 mL. Increased echogenicity of renal parenchyma is noted. No mass or hydronephrosis visualized. Bladder: Appears normal for degree of bladder distention. Other: None. IMPRESSION: Increased echogenicity of renal parenchyma is noted suggesting medical renal disease. No hydronephrosis or renal obstruction is noted. Electronically Signed   By: Marijo Conception M.D.   On: 01/09/2022 15:17   Medications: Infusions:  sodium chloride 100 mL/hr at 01/10/22 0650    Scheduled Medications:  carvedilol  12.5 mg Oral BID   citalopram  40 mg Oral Daily   ferrous sulfate  325 mg Oral BID WC   heparin  5,000 Units Subcutaneous Q8H   hydrALAZINE  50 mg Oral TID   insulin aspart  0-9 Units Subcutaneous TID WC   mycophenolate  500 mg Oral BID   pantoprazole  40 mg Oral Daily   pravastatin  40 mg Oral Daily   predniSONE  5 mg Oral Daily   sodium bicarbonate  1,300 mg Oral BID   tacrolimus  2 mg Oral QHS   tacrolimus  3  mg Oral Daily    have reviewed scheduled and prn medications.  Physical Exam: General:  NAD-   feels better Heart: tachy Lungs: mostly clear Abdomen: soft, non tender Extremities: no edema    01/10/2022,9:45 AM  LOS: 1 day

## 2022-01-10 NOTE — Progress Notes (Signed)
Subjective:   Hospital day: 1  Overnight event: No acute events overnight  Interim History: Patient evaluated at the bedside laying comfortably in bed. States he feels much better. Abdominal pain has improved and he has not have any more nausea or vomiting.  He continues to have some cramping in his hands and feet.  Objective:  Vital signs in last 24 hours: Vitals:   01/09/22 1731 01/09/22 1731 01/09/22 1821 01/09/22 2241  BP: (!) 125/100  (!) 121/101 110/80  Pulse: 96  100 99  Resp: (!) 21  19 16   Temp:  98.1 F (36.7 C) 97.9 F (36.6 C) 98.4 F (36.9 C)  TempSrc:  Oral  Oral  SpO2: 98%   100%  Weight:   63.3 kg   Height:   5\' 7"  (1.702 m)     Filed Weights   01/09/22 0902 01/09/22 1821  Weight: 69.4 kg 63.3 kg     Intake/Output Summary (Last 24 hours) at 01/10/2022 0648 Last data filed at 01/10/2022 0556 Gross per 24 hour  Intake 720 ml  Output 1000 ml  Net -280 ml   Net IO Since Admission: -280 mL [01/10/22 0648]  Recent Labs    01/10/22 0039  GLUCAP 109*     Pertinent Labs:    Latest Ref Rng & Units 01/09/2022   10:17 AM 11/05/2021    9:31 AM 10/22/2021    9:42 AM  CBC  WBC 4.0 - 10.5 K/uL 5.0     Hemoglobin 13.0 - 17.0 g/dL 14.7  12.3  11.3   Hematocrit 39.0 - 52.0 % 50.4     Platelets 150 - 400 K/uL 118          Latest Ref Rng & Units 01/09/2022   10:17 AM 11/05/2021   10:00 AM 10/22/2021   11:42 AM  CMP  Glucose 70 - 99 mg/dL 96  86  88   BUN 6 - 20 mg/dL 184  62  56   Creatinine 0.61 - 1.24 mg/dL 14.28  5.04  4.97   Sodium 135 - 145 mmol/L 130  139  138   Potassium 3.5 - 5.1 mmol/L 5.0  5.2  4.3   Chloride 98 - 111 mmol/L 91  110  113   CO2 22 - 32 mmol/L 15  20  17    Calcium 8.9 - 10.3 mg/dL 9.1  9.8  8.4   Total Protein 6.5 - 8.1 g/dL 7.1     Total Bilirubin 0.3 - 1.2 mg/dL 0.8     Alkaline Phos 38 - 126 U/L 152     AST 15 - 41 U/L 21     ALT 0 - 44 U/L 6       Imaging: CT ABDOMEN PELVIS WO CONTRAST  Result Date:  01/09/2022 CLINICAL DATA:  Nausea/vomiting, abdominal pain, acute, nonlocalized EXAM: CT ABDOMEN AND PELVIS WITHOUT CONTRAST TECHNIQUE: Multidetector CT imaging of the abdomen and pelvis was performed following the standard protocol without IV contrast. RADIATION DOSE REDUCTION: This exam was performed according to the departmental dose-optimization program which includes automated exposure control, adjustment of the mA and/or kV according to patient size and/or use of iterative reconstruction technique. COMPARISON:  11/05/2020 FINDINGS: Lower chest: Surgical changes of probable cardiac transplantation are again noted. No pericardial effusion. Visualized lung bases are clear. Hepatobiliary: No focal liver abnormality is seen. No gallstones, gallbladder wall thickening, or biliary dilatation. Pancreas: Unremarkable save for a duodenal diverticulum within the pancreatic head. Spleen: Unremarkable Adrenals/Urinary Tract: Adrenal  glands are unremarkable. Kidneys are normal, without renal calculi, focal lesion, or hydronephrosis. Bladder is unremarkable. Stomach/Bowel: Surgical changes of right hemicolectomy and right lower quadrant and ileostomy are again identified. Small parastomal hernia is again identified with multiple loops of terminal ileum. No evidence of obstruction or focal inflammation. There is moderate diverticulosis noted of the residual transverse and descending colon. No free intraperitoneal gas or fluid. Vascular/Lymphatic: Aortic atherosclerosis. No enlarged abdominal or pelvic lymph nodes. Reproductive: Prostate is unremarkable. Other: Tiny fat containing umbilical hernia. Infiltration with subcutaneous gas within the left lower quadrant intra-abdominal wall may reflect changes related to subcutaneous injection. Musculoskeletal: No acute bone abnormality. No lytic or blastic bone lesion. IMPRESSION: 1. No acute intra-abdominal pathology identified. No definite radiographic explanation for the  patient's reported symptoms. 2. Surgical changes of right hemicolectomy and right lower quadrant end ileostomy. Small parastomal hernia containing multiple loops of terminal ileum without evidence of obstruction or focal inflammation. 3. Moderate diverticulosis of the residual transverse and descending colon. Aortic Atherosclerosis (ICD10-I70.0). Electronically Signed   By: Fidela Salisbury M.D.   On: 01/09/2022 22:22   US RENAL  Result Date: 01/09/2022 CLINICAL DATA:  Acute kidney injury. EXAM: RENAL / URINARY TRACT ULTRASOUND COMPLETE COMPARISON:  May 12, 2021. FINDINGS: Right Kidney: Renal measurements: 9.0 x 5.7 x 4.2 cm = volume: 113 mL. Increased echogenicity of renal parenchyma is noted. No mass or hydronephrosis visualized. Left Kidney: Renal measurements: 10.5 x 5.5 x 4.6 cm = volume: 199 mL. Increased echogenicity of renal parenchyma is noted. No mass or hydronephrosis visualized. Bladder: Appears normal for degree of bladder distention. Other: None. IMPRESSION: Increased echogenicity of renal parenchyma is noted suggesting medical renal disease. No hydronephrosis or renal obstruction is noted. Electronically Signed   By: Marijo Conception M.D.   On: 01/09/2022 15:17    Physical Exam  General: Pleasant, well-appearing middle-age man laying in bed. No acute distress. CV: Stable median sternotomy scar.  RRR. No m/r/g. No LE edema Pulmonary: Lungs CTAB. Normal effort. No wheezing or rales. Abdominal: Soft, nontender. Ostomy bag stable on right side of abdomen. Normal bowel sounds. Extremities: 2+ distal pulses. Normal ROM. Skin: Warm and dry. No obvious rash or lesions. Neuro: A&Ox3. Moves all extremities. Normal sensation to gross touch.  Psych: Normal mood and affect   Assessment/Plan: [name] is a 51 y.o. male with hx of HTN, CKD4, diverticulitis s/p ileostomy in 2020 who presents to the hospital due to two day history of nausea/vomiting and generalized abdominal pain and  admitted due to uremic symptoms likely secondary to progressive renal failure.  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  He presented with uremic symptoms and found to have significantly elevated BUN/sCR 184/14.28, which is now trended to 163/12.82.  Patient has been fluid resuscitated in the setting of his dehydration from poor p.o. intake. Uremic symptoms have now improved but GFR remains low at 4.  No reversible cause of his progressive renal failure has been found on urinalysis or renal ultrasound. CT abdomen/pelvics does not show any acute intra-abdominal pathology.  Per nephrology, patient has likely progressed to ESRD and will need to start HD in the next few days however a tunnel HD catheter will increased the risk of infection in this patient with a history of heart  transplant. VVS consulted to assist with access.  Plan for left arm early access AV graft on Monday -Nephrology and vascular surgery following , -Plan for AV graft VVS on Monday -Consider percutaneous catheter for urgent HD needs -Discontinue IV fluids, encourage p.o. hydration -Continue sodium bicarbonate 1300 mg BID -Continue calcium carbonate 1,500 mg BID with meals -Continue protonix 40 mg daily -Trend renal function panel -Avoid nephrotoxic agents -Will discuss patient's case with his heart transplant team at [place]   Hypovolemic hyponatremia Patient found to have a sodium of 130 on admission likely secondary to hypovolemia and progression of his CKD.  Sodium improved to 137 after IV hydration. -Continue oral hydration -Trend RFP   History of heart transplant Mild Thrombocytopenia Heart transplant done in 2019. Per chart review,  patient had a history of severe mitral and tricuspid valve regurgitation  and ventricular fibrillation. On EKG, patient showed RBBB, LAFB, and LVH. Patient is currently on prednisone, mycophenolate, and tacrolimus which have all been resumed.  Platelets slightly low at 118 on admission likely secondary to his tacrolimus and mycophenolate. -Plan to reach out to his transplant team concerning his hospitalization -Pending tacrolimus levels -Continue cellcept 500 mg BID -Continue prednisone 5 mg daily -Continue tacrolimus 3 mg in AM and 2 mg at bedtime   Hypertension BP has improved overnight with SBP in the 110s after resuming his home blood pressure medications. -Continue hydralazine 50 mg TID -Continue coreg 12.5 mg BID   Anemia of chronic disease Iron deficiency anemia History of anemia of chronic disease.  Hemoglobin stable at 14.7 with MCV of 69 on admission. -Continue ferrous sulfate 325 mg BID with meals -Trend CBC   Hypertriglyceridemia Previous lipid panel in 2019 showed elevated triglycerides of 257. Patient is currently on pravastatin. Lipid panel showed improved triglycerides to 230 and LDL at goal at 65. -Continue pravastatin 40 mg daily -Encourage lifestyle modifications with dietary changes and exercise   Diabetes Patient's last A1c was 7.2 in 2016. Repeat A1c improved to 6.1%. CBGs have been appropriate in the 110s to 120s. -Continue SSI -CBG monitoring  Diet: Renal IVF: None VTE: Heparin CODE: Full  Prior to Admission Living Arrangement: Home Anticipated Discharge Location: Pending Barriers to Discharge: Medical stability Dispo: Anticipated discharge in approximately 2-3 day(s).   Signed: Lacinda Axon, MD 01/10/2022, 6:48 AM  Pager: [phone] Internal Medicine Teaching Service After 5pm on weekdays and 1pm on weekends: On Call pager: [phone]

## 2022-01-10 NOTE — Progress Notes (Signed)
Internal Medicine Attending:   I saw and examined the patient. I reviewed the resident's H&P note and I agree with the resident's findings and plan as documented in the resident's note.  In brief, patient is a 51 year old male with a past medical history of hypertension, CKD stage IV, diverticulitis status post ileostomy who presented to the ED with nausea/vomiting and abdominal pain over the last 3-4 days.  Patient also reports decreased oral intake during this time.  Patient states abdominal pain is diffuse without radiation.  He does complain of cramps in his bilateral lower extremities.  In the ED, patient was noted to have an AKI with creatinine up to 14 from his baseline of around 5-6 as well as an anion gap metabolic acidosis with a Bicarb of 15 and anion gap of 24.  Today, patient states that his abdominal pain and nausea and vomiting have resolved.  He does complain of cramping in his bilateral lower extremities.  Vitals:   01/09/22 2241 01/10/22 0900  BP: 110/80 112/82  Pulse: 99 98  Resp: 16 18  Temp: 98.4 F (36.9 C) 98.6 F (37 C)  SpO2: 100% 99%   On exam, patient is lying comfortably in bed with no apparent distress.  Cardiovascular exam reveals regular rate and rhythm with normal heart sounds.  Median sternotomy scar noted over anterior chest.  Lungs are clear to auscultation bilaterally.  Abdomen is soft, nontender, nondistended with normoactive bowel sounds.  Midline abdominal scar and ostomy bag noted.  No lower extremity edema noted and extremities are nontender to palpation.  Patient is oriented x3.  Patient is admitted to the hospital for acute on chronic renal failure and anion gap metabolic acidosis in the setting of nausea/vomiting and abdominal pain over the last 3 to 4 days.  The etiology behind his AKI remains uncertain but I suspect this is likely progression of his chronic kidney disease.  No reversible etiology for his AKI was identified.  His renal sono showed no  evidence of obstruction and his UA showed only minimal protein.  There have been no new medications that could cause this.  Patient did have uremic symptoms on admission with a BUN of 180.  Patient did receive IV hydration on admission with subsequent improvement of his creatinine to 12 and his BUN to the 160s.  However, I suspect the patient will likely need hemodialysis beginning next week given the progression of his kidney disease unless there is significant improvement over the next 48 hours.  Nephrology follow-up recommendations appreciated.  Vascular surgery was consulted for prophylaxis given that his left brachiocephalic fistula is nonfunctional and likely thrombosed.  Vascular surgery will attempt left arm early access AV graft on Monday.  If patient needs more urgent hemodialysis he will need a percutaneous catheter placement.  However, it would be preferable to avoid this to decrease the risk of infection in this patient with an underlying heart transplant.  Patient abdominal pain is now resolved and his CT abdomen/pelvis showed no underlying acute pathology that would explain this.  I suspect that most of his GI symptoms are secondary to underlying uremia from the progression of his CKD to ESRD.  Would DC IV fluids for now as patient is now tolerating oral intake.  Patient is anion gap metabolic acidosis is slowly improving.  Patient bicarb is improved to 19 today and his anion gap is decreased to 16.  We will continue with oral bicarb supplementation.  No further work-up at this time.  Of note, patient does have a history of heart transplant.  We will continue with his CellCept, tacrolimus and prednisone.  I have also asked the residents to contact his heart transplant team at [facility] to inform them of his progression to ESRD.

## 2022-01-10 NOTE — Plan of Care (Signed)

## 2022-01-10 NOTE — Progress Notes (Signed)
[facility] [place] PROGRESS NOTE  ASSESSMENT / PLAN: [name] is a 51 y.o. male with ESRD. Failed left brachiocephalic arteriovenous fistula. In need of permanent dialysis access. I agree with Dr. Moshe Cipro - would prefer to avoid a tunneled catheter to avoid risk of infection in this immunosuppressed patient.   Plan left arm early access arteriovenous graft with me on Monday 01/12/22. If he needs access for dialysis more urgently he should get a percutaneous catheter. I will follow him through the weekend.  SUBJECTIVE: No complaints. Reviewed tentative plan for surgery early next week.  OBJECTIVE: BP 110/80   Pulse 99   Temp 98.4 F (36.9 C) (Oral)   Resp 16   Ht 5\' 7"  (1.702 m)   Wt 63.3 kg   SpO2 100%   BMI 21.86 kg/m   Intake/Output Summary (Last 24 hours) at 01/10/2022 0828 Last data filed at 01/10/2022 0601 Gross per 24 hour  Intake 1819.88 ml  Output 1000 ml  Net 819.88 ml    No distress Regular rate and rhythm Unlabored breathing Thrombosed left brachiocephalic arteriovenous fistula     Latest Ref Rng & Units 01/09/2022   10:17 AM 11/05/2021    9:31 AM 10/22/2021    9:42 AM  CBC  WBC 4.0 - 10.5 K/uL 5.0     Hemoglobin 13.0 - 17.0 g/dL 14.7  12.3  11.3   Hematocrit 39.0 - 52.0 % 50.4     Platelets 150 - 400 K/uL 118           Latest Ref Rng & Units 01/09/2022   10:17 AM 11/05/2021   10:00 AM 10/22/2021   11:42 AM  CMP  Glucose 70 - 99 mg/dL 96  86  88   BUN 6 - 20 mg/dL 184  62  56   Creatinine 0.61 - 1.24 mg/dL 14.28  5.04  4.97   Sodium 135 - 145 mmol/L 130  139  138   Potassium 3.5 - 5.1 mmol/L 5.0  5.2  4.3   Chloride 98 - 111 mmol/L 91  110  113   CO2 22 - 32 mmol/L 15  20  17    Calcium 8.9 - 10.3 mg/dL 9.1  9.8  8.4   Total Protein 6.5 - 8.1 g/dL 7.1     Total Bilirubin 0.3 - 1.2 mg/dL 0.8     Alkaline Phos 38 - 126 U/L 152     AST 15 - 41 U/L 21     ALT 0 - 44 U/L 6       Estimated Creatinine Clearance: 5.4  mL/min (A) (by C-G formula based on SCr of 14.28 mg/dL (H)).  [name]. Stanford Breed, MD [facility] Phone Number: [phone] 01/10/2022 8:28 AM

## 2022-01-11 DIAGNOSIS — N186 End stage renal disease: Secondary | ICD-10-CM

## 2022-01-11 LAB — CBC
HCT: 35.9 % — ABNORMAL LOW (ref 39.0–52.0)
Hemoglobin: 10.6 g/dL — ABNORMAL LOW (ref 13.0–17.0)
MCH: 20.2 pg — ABNORMAL LOW (ref 26.0–34.0)
MCHC: 29.5 g/dL — ABNORMAL LOW (ref 30.0–36.0)
MCV: 68.3 fL — ABNORMAL LOW (ref 80.0–100.0)
Platelets: 94 10*3/uL — ABNORMAL LOW (ref 150–400)
RBC: 5.26 MIL/uL (ref 4.22–5.81)
RDW: 16.9 % — ABNORMAL HIGH (ref 11.5–15.5)
WBC: 5.1 10*3/uL (ref 4.0–10.5)
nRBC: 0 % (ref 0.0–0.2)

## 2022-01-11 LAB — GLUCOSE, CAPILLARY
Glucose-Capillary: 104 mg/dL — ABNORMAL HIGH (ref 70–99)
Glucose-Capillary: 81 mg/dL (ref 70–99)
Glucose-Capillary: 115 mg/dL — ABNORMAL HIGH (ref 70–99)
Glucose-Capillary: 144 mg/dL — ABNORMAL HIGH (ref 70–99)

## 2022-01-11 LAB — RENAL FUNCTION PANEL
Albumin: 3.1 g/dL — ABNORMAL LOW (ref 3.5–5.0)
Anion gap: 15 (ref 5–15)
BUN: 153 mg/dL — ABNORMAL HIGH (ref 6–20)
CO2: 19 mmol/L — ABNORMAL LOW (ref 22–32)
Calcium: 8.1 mg/dL — ABNORMAL LOW (ref 8.9–10.3)
Chloride: 101 mmol/L (ref 98–111)
Creatinine, Ser: 12.05 mg/dL — ABNORMAL HIGH (ref 0.61–1.24)
GFR, Estimated: 5 mL/min — ABNORMAL LOW (ref >60)
Glucose, Bld: 92 mg/dL (ref 70–99)
Phosphorus: 7.7 mg/dL — ABNORMAL HIGH (ref 2.5–4.6)
Potassium: 3.8 mmol/L (ref 3.5–5.1)
Sodium: 135 mmol/L (ref 135–145)

## 2022-01-11 LAB — HEPATITIS B SURFACE ANTIBODY,QUALITATIVE: Hep B S Ab: NONREACTIVE

## 2022-01-11 LAB — HEPATITIS B CORE ANTIBODY, TOTAL: Hep B Core Total Ab: NONREACTIVE

## 2022-01-11 LAB — HEPATITIS C ANTIBODY: HCV Ab: NONREACTIVE

## 2022-01-11 LAB — HEPATITIS B SURFACE ANTIGEN: Hepatitis B Surface Ag: NONREACTIVE

## 2022-01-11 MED ORDER — HYDRALAZINE HCL 10 MG PO TABS
10.0000 mg | ORAL_TABLET | Freq: Three times a day (TID) | ORAL | Status: DC
  Filled 2022-01-11: qty 1

## 2022-01-11 MED ORDER — CYCLOBENZAPRINE HCL 10 MG PO TABS
10.0000 mg | ORAL_TABLET | Freq: Two times a day (BID) | ORAL | Status: DC | PRN
  Administered 2022-01-11 – 2022-01-13 (×4): 10 mg via ORAL
  Filled 2022-01-11 (×4): qty 1

## 2022-01-11 MED ORDER — CEFAZOLIN SODIUM-DEXTROSE 2-4 GM/100ML-% IV SOLN
2.0000 g | INTRAVENOUS | Status: AC
  Administered 2022-01-12: 2 g via INTRAVENOUS
  Filled 2022-01-11: qty 100

## 2022-01-11 MED ORDER — CHLORHEXIDINE GLUCONATE CLOTH 2 % EX PADS
6.0000 | MEDICATED_PAD | Freq: Every day | CUTANEOUS | Status: DC
  Administered 2022-01-11 – 2022-01-14 (×4): 6 via TOPICAL

## 2022-01-11 NOTE — Progress Notes (Addendum)
HD#2 Subjective:  Overnight Events: no event  Patient is seen at bedside.  He appears comfortable in no acute distress.  He denies any fogginess, somnolent or taste change.  Patient reports cramping of his lower extremity which duloxetine is working.  Denies any leg swelling or dyspnea.  Patient changed his ostomy bag.   Objective:  Vital signs in last 24 hours: Vitals:   01/10/22 1518 01/10/22 1636 01/10/22 2127 01/11/22 0504  BP: 110/77 102/77 110/70 107/74  Pulse: 88 86 88 91  Resp: 18 18 18 18   Temp: (!) 97 F (36.1 C) 98.6 F (37 C) 97.8 F (36.6 C) 98.3 F (36.8 C)  TempSrc: Oral Oral Oral Oral  SpO2: 100% 100% 99% 100%  Weight:      Height:       Supplemental O2: Room Air SpO2: 100 %   Physical Exam:  Physical Exam Constitutional:      General: He is not in acute distress.    Appearance: He is not ill-appearing.  HENT:     Head: Normocephalic.  Eyes:     General:        Right eye: No discharge.        Left eye: No discharge.     Conjunctiva/sclera: Conjunctivae normal.  Cardiovascular:     Rate and Rhythm: Normal rate and regular rhythm.     Heart sounds: Normal heart sounds. No murmur heard.    Comments: No LE edema Pulmonary:     Effort: Pulmonary effort is normal. No respiratory distress.     Breath sounds: Normal breath sounds. No wheezing.  Abdominal:     General: Bowel sounds are normal.     Palpations: Abdomen is soft.     Comments: Ostomy bag on the left lower quadrant.  Site appears clean.  The bag is empty.  Skin:    General: Skin is warm.  Neurological:     Mental Status: He is alert.  Psychiatric:        Mood and Affect: Mood normal.     Filed Weights   01/09/22 0902 01/09/22 1821  Weight: 69.4 kg 63.3 kg     Intake/Output Summary (Last 24 hours) at 01/11/2022 1638 Last data filed at 01/11/2022 0504 Gross per 24 hour  Intake 1002.76 ml  Output 900 ml  Net 102.76 ml   Net IO Since Admission: 922.64 mL [01/11/22  0712]  Pertinent Labs:    Latest Ref Rng & Units 01/09/2022   10:17 AM 11/05/2021    9:31 AM 10/22/2021    9:42 AM  CBC  WBC 4.0 - 10.5 K/uL 5.0     Hemoglobin 13.0 - 17.0 g/dL 14.7  12.3  11.3   Hematocrit 39.0 - 52.0 % 50.4     Platelets 150 - 400 K/uL 118          Latest Ref Rng & Units 01/10/2022    7:50 AM 01/09/2022   10:17 AM 11/05/2021   10:00 AM  CMP  Glucose 70 - 99 mg/dL 112  96  86   BUN 6 - 20 mg/dL 163  184  62   Creatinine 0.61 - 1.24 mg/dL 12.82  14.28  5.04   Sodium 135 - 145 mmol/L 137  130  139   Potassium 3.5 - 5.1 mmol/L 4.8  5.0  5.2   Chloride 98 - 111 mmol/L 102  91  110   CO2 22 - 32 mmol/L 19  15  20  Calcium 8.9 - 10.3 mg/dL 8.0  9.1  9.8   Total Protein 6.5 - 8.1 g/dL  7.1    Total Bilirubin 0.3 - 1.2 mg/dL  0.8    Alkaline Phos 38 - 126 U/L  152    AST 15 - 41 U/L  21    ALT 0 - 44 U/L  6      Imaging: No results found.  Assessment/Plan:   Principal Problem:   Acute on chronic renal failure (HCC) Active Problems:   Essential hypertension, benign   Chronic systolic congestive heart failure (HCC)   ESRD (end stage renal disease) ([place])   Patient Summary: [name] is a 51 y.o. male with hx of HTN, CKD4, diverticulitis s/p ileostomy in 2020 who presents to the hospital due to two day history of nausea/vomiting and generalized abdominal pain and admitted due to uremic symptoms likely secondary to progressive renal failure.   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  There is no obvious reversible process.  No evidence of uremia on exam.  Pending morning renal function. Vascular surgery plans for AV fistula placement Monday morning. -Nephrology and vascular surgery following  -Continue sodium bicarbonate 1300 mg BID -Trend renal function panel -Avoid nephrotoxic agents -Unable to contact his heart transplant at [facility] on the weekend.  Voicemail was left to call back    Abdominal pain-resolved No underlying acute pathology on CT abdomen/pelvis.  Suspect secondary to uremia from ESRD.  Abdominal pain has resolved and patient is able to tolerate p.o. intake.  History of heart transplant Mild Thrombocytopenia Heart transplant done in 2019. Patient had a history of severe mitral and tricuspid valve regurgitation and ventricular fibrillation. Prednisone, mycophenolate, and tacrolimus which have all been resumed.  Platelets slightly low at 118 on admission likely secondary to his tacrolimus and mycophenolate. -Pending tacrolimus levels -Continue cellcept 500 mg BID -Continue prednisone 5 mg daily -Continue tacrolimus 3 mg in AM and 2 mg at bedtime   Hypertension Blood pressure well controlled -Decrease hydralazine per nephro -Continue coreg 12.5 mg BID   Microcytic anemia  No evidence of iron deficiency on blood work in July.  Patient is on iron supplement. Hemoglobin stable at 14.7  -Continue ferrous sulfate 325 mg BID with meals -Trend CBC   Hypertriglyceridemia Previous lipid panel in 2019 showed elevated triglycerides of 257. Patient is currently on pravastatin. Lipid panel showed improved triglycerides to 230 and LDL at goal at 65. -Continue pravastatin 40 mg daily -Encourage lifestyle modifications with dietary changes and exercise   Diabetes Patient's last A1c was 7.2 in 2016. Repeat A1c improved to 6.1%. CBGs have been well controlled -Continue SSI -CBG monitoring  Diet: Renal IVF: None,None VTE: Heparin Code: Full PT/OT recs: None, none. TOC recs:   Dispo: Anticipated discharge to Home in 3 days pending AVF placement and possible HD.   [name] Gerold, DO 01/11/2022, 7:12 AM Pager: [phone]  Please contact the on call pager after 5 pm and on weekends at [phone].

## 2022-01-11 NOTE — Progress Notes (Signed)
Subjective:  stable overnight-  flexeril helps a little with muscle spasms but still bothering him especially in the feet-  stomach still ok -  no recent labs    Objective Vital signs in last 24 hours: Vitals:   01/10/22 1636 01/10/22 2127 01/11/22 0504 01/11/22 0932  BP: 102/77 110/70 107/74 99/67  Pulse: 86 88 91 90  Resp: 18 18 18 18   Temp: 98.6 F (37 C) 97.8 F (36.6 C) 98.3 F (36.8 C) 98.4 F (36.9 C)  TempSrc: Oral Oral Oral Oral  SpO2: 100% 99% 100% 100%  Weight:      Height:       Weight change:   Intake/Output Summary (Last 24 hours) at 01/11/2022 0945 Last data filed at 01/11/2022 0845 Gross per 24 hour  Intake 1002.76 ml  Output 900 ml  Net 102.76 ml    Assessment/Plan: 51 year old BM s/p heart transplant with also advenced CKD 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  Over last one month now crt is 14-  no med change or event that brought this on and he is not hypotensive so that makes me think this is just progression of CKD now to ESRD.  He is having uremic signs and symptoms.   UA and ultrasound not showing anything reversible - labs were a little bit better yest but  still very abnormal and this likely means he needs to start chronic HD.  The  other issue is that we dont have access.  Have consulted [facility] for ideas-  they think he would be a candidate for an immediate stick AVG ?  tentatively plan for tomorrow followed by HD-  unless his numbers improve remarkably in the next 48 hours we will proceed to start.  Will need to contact appropriate parties to plan for OP HD tomorrow as well  2. Hypertension/volume  - he did not appear dry or hypotensive upon admit-  continue coreg and hydralazine-  but now BP is soft, decrease hydralazine from 50 to 10 mg  3. Metabolic acidosis- part of the renal failure- giving bicarb oral until we can get HD done  4.  N/V-  due to uremia-  symptomatic treatment  5. Anemia  - is not issue right now 5. S/p heart transplant-  continue the prograf, cellcept and pred     Louis Meckel    Labs: Basic Metabolic Panel: Recent Labs  Lab 01/09/22 1017 01/10/22 0750  NA 130* 137  K 5.0 4.8  CL 91* 102  CO2 15* 19*  GLUCOSE 96 112*  BUN 184* 163*  CREATININE 14.28* 12.82*  CALCIUM 9.1 8.0*  PHOS  --  8.0*   Liver Function Tests: Recent Labs  Lab 01/09/22 1017 01/10/22 0750  AST 21  --   ALT 6  --   ALKPHOS 152*  --   BILITOT 0.8  --   PROT 7.1  --   ALBUMIN 3.9 3.2*   Recent Labs  Lab 01/09/22 1953  LIPASE 106*   No results for input(s): "AMMONIA" in the last 168 hours. CBC: Recent Labs  Lab 01/09/22 1017  WBC 5.0  NEUTROABS 3.4  HGB 14.7  HCT 50.4  MCV 69.1*  PLT 118*   Cardiac Enzymes: No results for input(s): "CKTOTAL", "CKMB", "CKMBINDEX", "TROPONINI" in the last 168 hours. CBG: Recent Labs  Lab 01/10/22 0740 01/10/22 1142  01/10/22 1635 01/10/22 2128 01/11/22 0725  GLUCAP 127* 126* 100* 123* 104*    Iron Studies: No results for input(s): "IRON", "TIBC", "TRANSFERRIN", "FERRITIN" in the last 72 hours. Studies/Results: CT ABDOMEN PELVIS WO CONTRAST  Result Date: 01/09/2022 CLINICAL DATA:  Nausea/vomiting, abdominal pain, acute, nonlocalized EXAM: CT ABDOMEN AND PELVIS WITHOUT CONTRAST TECHNIQUE: Multidetector CT imaging of the abdomen and pelvis was performed following the standard protocol without IV contrast. RADIATION DOSE REDUCTION: This exam was performed according to the departmental dose-optimization program which includes automated exposure control, adjustment of the mA and/or kV according to patient size and/or use of iterative reconstruction technique. COMPARISON:  11/05/2020 FINDINGS: Lower chest: Surgical changes of probable cardiac transplantation are again noted. No pericardial effusion. Visualized lung bases are clear. Hepatobiliary: No focal  liver abnormality is seen. No gallstones, gallbladder wall thickening, or biliary dilatation. Pancreas: Unremarkable save for a duodenal diverticulum within the pancreatic head. Spleen: Unremarkable Adrenals/Urinary Tract: Adrenal glands are unremarkable. Kidneys are normal, without renal calculi, focal lesion, or hydronephrosis. Bladder is unremarkable. Stomach/Bowel: Surgical changes of right hemicolectomy and right lower quadrant and ileostomy are again identified. Small parastomal hernia is again identified with multiple loops of terminal ileum. No evidence of obstruction or focal inflammation. There is moderate diverticulosis noted of the residual transverse and descending colon. No free intraperitoneal gas or fluid. Vascular/Lymphatic: Aortic atherosclerosis. No enlarged abdominal or pelvic lymph nodes. Reproductive: Prostate is unremarkable. Other: Tiny fat containing umbilical hernia. Infiltration with subcutaneous gas within the left lower quadrant intra-abdominal wall may reflect changes related to subcutaneous injection. Musculoskeletal: No acute bone abnormality. No lytic or blastic bone lesion. IMPRESSION: 1. No acute intra-abdominal pathology identified. No definite radiographic explanation for the patient's reported symptoms. 2. Surgical changes of right hemicolectomy and right lower quadrant end ileostomy. Small parastomal hernia containing multiple loops of terminal ileum without evidence of obstruction or focal inflammation. 3. Moderate diverticulosis of the residual transverse and descending colon. Aortic Atherosclerosis (ICD10-I70.0). Electronically Signed   By: Fidela Salisbury M.D.   On: 01/09/2022 22:22   US RENAL  Result Date: 01/09/2022 CLINICAL DATA:  Acute kidney injury. EXAM: RENAL / URINARY TRACT ULTRASOUND COMPLETE COMPARISON:  May 12, 2021. FINDINGS: Right Kidney: Renal measurements: 9.0 x 5.7 x 4.2 cm = volume: 113 mL. Increased echogenicity of renal parenchyma is noted. No mass  or hydronephrosis visualized. Left Kidney: Renal measurements: 10.5 x 5.5 x 4.6 cm = volume: 199 mL. Increased echogenicity of renal parenchyma is noted. No mass or hydronephrosis visualized. Bladder: Appears normal for degree of bladder distention. Other: None. IMPRESSION: Increased echogenicity of renal parenchyma is noted suggesting medical renal disease. No hydronephrosis or renal obstruction is noted. Electronically Signed   By: Marijo Conception M.D.   On: 01/09/2022 15:17   Medications: Infusions:  [START ON 01/12/2022]  ceFAZolin (ANCEF) IV      Scheduled Medications:  carvedilol  12.5 mg Oral BID   citalopram  40 mg Oral Daily   ferrous sulfate  325 mg Oral BID WC   heparin  5,000 Units Subcutaneous Q8H   hydrALAZINE  50 mg Oral TID   insulin aspart  0-9 Units Subcutaneous TID WC   mycophenolate  500 mg Oral BID   pantoprazole  40 mg Oral Daily   pravastatin  40 mg Oral Daily   predniSONE  5 mg Oral Daily   sodium bicarbonate  1,300 mg Oral BID   tacrolimus  2 mg Oral QHS   tacrolimus  3 mg  Oral Daily    have reviewed scheduled and prn medications.  Physical Exam: General:  NAD-   feels better Heart: tachy Lungs: mostly clear Abdomen: soft, non tender Extremities: no edema    01/11/2022,9:45 AM  LOS: 2 days

## 2022-01-11 NOTE — Progress Notes (Signed)
[facility] [place] PROGRESS NOTE  ASSESSMENT / PLAN: [name] is a 51 y.o. male with ESRD. Failed left brachiocephalic arteriovenous fistula. In need of permanent dialysis access. I agree with Dr. Moshe Cipro - would prefer to avoid a tunneled catheter to avoid risk of infection in this immunosuppressed patient.   Plan left arm early access arteriovenous graft with me on Monday 01/12/22. If he needs access for dialysis more urgently he should get a percutaneous catheter. I will follow him through the weekend.  SUBJECTIVE: Cramping in feet. Reviewed plan for surgery tomorrow.  OBJECTIVE: BP 107/74 (BP Location: Right Arm)   Pulse 91   Temp 98.3 F (36.8 C) (Oral)   Resp 18   Ht 5\' 7"  (1.702 m)   Wt 63.3 kg   SpO2 100%   BMI 21.86 kg/m   Intake/Output Summary (Last 24 hours) at 01/11/2022 0928 Last data filed at 01/11/2022 0845 Gross per 24 hour  Intake 1002.76 ml  Output 900 ml  Net 102.76 ml     No distress Regular rate and rhythm Unlabored breathing Thrombosed left brachiocephalic arteriovenous fistula 2+ DP pulses     Latest Ref Rng & Units 01/09/2022   10:17 AM 11/05/2021    9:31 AM 10/22/2021    9:42 AM  CBC  WBC 4.0 - 10.5 K/uL 5.0     Hemoglobin 13.0 - 17.0 g/dL 14.7  12.3  11.3   Hematocrit 39.0 - 52.0 % 50.4     Platelets 150 - 400 K/uL 118           Latest Ref Rng & Units 01/10/2022    7:50 AM 01/09/2022   10:17 AM 11/05/2021   10:00 AM  CMP  Glucose 70 - 99 mg/dL 112  96  86   BUN 6 - 20 mg/dL 163  184  62   Creatinine 0.61 - 1.24 mg/dL 12.82  14.28  5.04   Sodium 135 - 145 mmol/L 137  130  139   Potassium 3.5 - 5.1 mmol/L 4.8  5.0  5.2   Chloride 98 - 111 mmol/L 102  91  110   CO2 22 - 32 mmol/L 19  15  20    Calcium 8.9 - 10.3 mg/dL 8.0  9.1  9.8   Total Protein 6.5 - 8.1 g/dL  7.1    Total Bilirubin 0.3 - 1.2 mg/dL  0.8    Alkaline Phos 38 - 126 U/L  152    AST 15 - 41 U/L  21    ALT 0 - 44 U/L  6      Estimated  Creatinine Clearance: 6 mL/min (A) (by C-G formula based on SCr of 12.82 mg/dL (H)).  [name]. [name] Breed, MD [facility] Phone Number: [phone] 01/11/2022 9:28 AM

## 2022-01-12 ENCOUNTER — Encounter (HOSPITAL_COMMUNITY): Payer: Self-pay | Admitting: Internal Medicine

## 2022-01-12 ENCOUNTER — Inpatient Hospital Stay (HOSPITAL_COMMUNITY): Payer: Medicare Other | Admitting: Anesthesiology

## 2022-01-12 ENCOUNTER — Encounter (HOSPITAL_COMMUNITY)
Admission: EM | Disposition: A | Discharge status: Home / Self Care | Payer: Self-pay | Source: Home / Self Care | Attending: Internal Medicine

## 2022-01-12 DIAGNOSIS — N185 Chronic kidney disease, stage 5: Secondary | ICD-10-CM

## 2022-01-12 DIAGNOSIS — I5022 Chronic systolic (congestive) heart failure: Secondary | ICD-10-CM

## 2022-01-12 DIAGNOSIS — N186 End stage renal disease: Secondary | ICD-10-CM | POA: Diagnosis not present

## 2022-01-12 DIAGNOSIS — N170 Acute kidney failure with tubular necrosis: Secondary | ICD-10-CM | POA: Diagnosis not present

## 2022-01-12 DIAGNOSIS — I132 Hypertensive heart and chronic kidney disease with heart failure and with stage 5 chronic kidney disease, or end stage renal disease: Principal | ICD-10-CM

## 2022-01-12 DIAGNOSIS — I509 Heart failure, unspecified: Secondary | ICD-10-CM

## 2022-01-12 DIAGNOSIS — D631 Anemia in chronic kidney disease: Secondary | ICD-10-CM

## 2022-01-12 HISTORY — PX: AV FISTULA PLACEMENT: SHX1204

## 2022-01-12 LAB — POCT I-STAT, CHEM 8
BUN: >130 mg/dL — ABNORMAL HIGH (ref 6–20)
Calcium, Ion: 1.06 mmol/L — ABNORMAL LOW (ref 1.15–1.40)
Chloride: 102 mmol/L (ref 98–111)
Creatinine, Ser: 12.4 mg/dL — ABNORMAL HIGH (ref 0.61–1.24)
Glucose, Bld: 83 mg/dL (ref 70–99)
HCT: 37 % — ABNORMAL LOW (ref 39.0–52.0)
Hemoglobin: 12.6 g/dL — ABNORMAL LOW (ref 13.0–17.0)
Potassium: 3.9 mmol/L (ref 3.5–5.1)
Sodium: 136 mmol/L (ref 135–145)
TCO2: 20 mmol/L — ABNORMAL LOW (ref 22–32)

## 2022-01-12 LAB — RENAL FUNCTION PANEL
Albumin: 3.1 g/dL — ABNORMAL LOW (ref 3.5–5.0)
Anion gap: 15 (ref 5–15)
BUN: 145 mg/dL — ABNORMAL HIGH (ref 6–20)
CO2: 20 mmol/L — ABNORMAL LOW (ref 22–32)
Calcium: 8.3 mg/dL — ABNORMAL LOW (ref 8.9–10.3)
Chloride: 102 mmol/L (ref 98–111)
Creatinine, Ser: 11.92 mg/dL — ABNORMAL HIGH (ref 0.61–1.24)
GFR, Estimated: 5 mL/min — ABNORMAL LOW (ref >60)
Glucose, Bld: 82 mg/dL (ref 70–99)
Phosphorus: 7.8 mg/dL — ABNORMAL HIGH (ref 2.5–4.6)
Potassium: 3.9 mmol/L (ref 3.5–5.1)
Sodium: 137 mmol/L (ref 135–145)

## 2022-01-12 LAB — CBC
HCT: 35.8 % — ABNORMAL LOW (ref 39.0–52.0)
Hemoglobin: 10.3 g/dL — ABNORMAL LOW (ref 13.0–17.0)
MCH: 19.9 pg — ABNORMAL LOW (ref 26.0–34.0)
MCHC: 28.8 g/dL — ABNORMAL LOW (ref 30.0–36.0)
MCV: 69.2 fL — ABNORMAL LOW (ref 80.0–100.0)
Platelets: 89 10*3/uL — ABNORMAL LOW (ref 150–400)
RBC: 5.17 MIL/uL (ref 4.22–5.81)
RDW: 16.8 % — ABNORMAL HIGH (ref 11.5–15.5)
WBC: 4.3 10*3/uL (ref 4.0–10.5)
nRBC: 0 % (ref 0.0–0.2)

## 2022-01-12 LAB — HEPATITIS B SURFACE ANTIBODY, QUANTITATIVE: Hep B S AB Quant (Post): 5.2 m[IU]/mL — ABNORMAL LOW (ref >9.9)

## 2022-01-12 LAB — GLUCOSE, CAPILLARY
Glucose-Capillary: 92 mg/dL (ref 70–99)
Glucose-Capillary: 87 mg/dL (ref 70–99)
Glucose-Capillary: 91 mg/dL (ref 70–99)
Glucose-Capillary: 91 mg/dL (ref 70–99)

## 2022-01-12 SURGERY — ARTERIOVENOUS (AV) FISTULA CREATION
Anesthesia: Monitor Anesthesia Care | Site: Arm Lower | Laterality: Left

## 2022-01-12 MED ORDER — SODIUM CHLORIDE 0.9 % IV SOLN: INTRAVENOUS | Status: DC | PRN

## 2022-01-12 MED ORDER — ACETAMINOPHEN 10 MG/ML IV SOLN: 1000.0000 mg | Freq: Once | INTRAVENOUS | Status: DC | PRN

## 2022-01-12 MED ORDER — CHLORHEXIDINE GLUCONATE 0.12 % MT SOLN
OROMUCOSAL | Status: AC
  Filled 2022-01-12: qty 15

## 2022-01-12 MED ORDER — MIDAZOLAM HCL 2 MG/2ML IJ SOLN
INTRAMUSCULAR | Status: AC
  Filled 2022-01-12: qty 2

## 2022-01-12 MED ORDER — FENTANYL CITRATE (PF) 100 MCG/2ML IJ SOLN: 50.0000 ug | Freq: Once | INTRAMUSCULAR | Status: AC

## 2022-01-12 MED ORDER — PENTAFLUOROPROP-TETRAFLUOROETH EX AERO: 1.0000 | INHALATION_SPRAY | CUTANEOUS | Status: DC | PRN

## 2022-01-12 MED ORDER — ACETAMINOPHEN 500 MG PO TABS
ORAL_TABLET | ORAL | Status: AC
  Administered 2022-01-12: 1000 mg via ORAL
  Filled 2022-01-12: qty 2

## 2022-01-12 MED ORDER — MIDAZOLAM HCL 2 MG/2ML IJ SOLN: 0.5000 mg | Freq: Once | INTRAMUSCULAR | Status: AC

## 2022-01-12 MED ORDER — ALTEPLASE 2 MG IJ SOLR: 2.0000 mg | Freq: Once | INTRAMUSCULAR | Status: DC | PRN

## 2022-01-12 MED ORDER — LIDOCAINE 2% (20 MG/ML) 5 ML SYRINGE
INTRAMUSCULAR | Status: AC
  Filled 2022-01-12: qty 5

## 2022-01-12 MED ORDER — LIDOCAINE HCL (PF) 1 % IJ SOLN: 5.0000 mL | INTRAMUSCULAR | Status: DC | PRN

## 2022-01-12 MED ORDER — PROPOFOL 500 MG/50ML IV EMUL
INTRAVENOUS | Status: DC | PRN
  Administered 2022-01-12: 25 ug/kg/min via INTRAVENOUS

## 2022-01-12 MED ORDER — MIDAZOLAM HCL 2 MG/2ML IJ SOLN
INTRAMUSCULAR | Status: AC
  Administered 2022-01-12: 0.5 mg via INTRAVENOUS
  Filled 2022-01-12: qty 2

## 2022-01-12 MED ORDER — MEPIVACAINE HCL (PF) 2 % IJ SOLN
INTRAMUSCULAR | Status: DC | PRN
  Administered 2022-01-12: 20 mL

## 2022-01-12 MED ORDER — FENTANYL CITRATE (PF) 100 MCG/2ML IJ SOLN: 25.0000 ug | INTRAMUSCULAR | Status: DC | PRN

## 2022-01-12 MED ORDER — FENTANYL CITRATE (PF) 250 MCG/5ML IJ SOLN
INTRAMUSCULAR | Status: AC
  Filled 2022-01-12: qty 5

## 2022-01-12 MED ORDER — LIDOCAINE HCL (PF) 1 % IJ SOLN
INTRAMUSCULAR | Status: AC
  Filled 2022-01-12: qty 30

## 2022-01-12 MED ORDER — ROCURONIUM BROMIDE 10 MG/ML (PF) SYRINGE
PREFILLED_SYRINGE | INTRAVENOUS | Status: AC
  Filled 2022-01-12: qty 10

## 2022-01-12 MED ORDER — HEPARIN 6000 UNIT IRRIGATION SOLUTION
Status: AC
  Filled 2022-01-12: qty 500

## 2022-01-12 MED ORDER — FENTANYL CITRATE (PF) 100 MCG/2ML IJ SOLN
INTRAMUSCULAR | Status: AC
  Administered 2022-01-12: 50 ug via INTRAVENOUS
  Filled 2022-01-12: qty 2

## 2022-01-12 MED ORDER — FENTANYL CITRATE (PF) 250 MCG/5ML IJ SOLN
INTRAMUSCULAR | Status: DC | PRN
  Administered 2022-01-12: 50 ug via INTRAVENOUS

## 2022-01-12 MED ORDER — FERROUS SULFATE 325 (65 FE) MG PO TABS
325.0000 mg | ORAL_TABLET | Freq: Every day | ORAL | Status: DC
  Administered 2022-01-13: 325 mg via ORAL
  Filled 2022-01-12: qty 1

## 2022-01-12 MED ORDER — PHENYLEPHRINE HCL-NACL 20-0.9 MG/250ML-% IV SOLN
INTRAVENOUS | Status: DC | PRN
  Administered 2022-01-12: 50 ug/min via INTRAVENOUS

## 2022-01-12 MED ORDER — 0.9 % SODIUM CHLORIDE (POUR BTL) OPTIME
TOPICAL | Status: DC | PRN
  Administered 2022-01-12: 1000 mL

## 2022-01-12 MED ORDER — ACETAMINOPHEN 500 MG PO TABS: 1000.0000 mg | ORAL_TABLET | Freq: Once | ORAL | Status: AC

## 2022-01-12 MED ORDER — ANTICOAGULANT SODIUM CITRATE 4% (200MG/5ML) IV SOLN
5.0000 mL | Status: DC | PRN
  Filled 2022-01-12: qty 5

## 2022-01-12 MED ORDER — ROPIVACAINE HCL 5 MG/ML IJ SOLN
INTRAMUSCULAR | Status: DC | PRN
  Administered 2022-01-12: 10 mL

## 2022-01-12 MED ORDER — ALBUMIN HUMAN 5 % IV SOLN: 12.5000 g | Freq: Once | INTRAVENOUS | Status: AC

## 2022-01-12 MED ORDER — HEPARIN SODIUM (PORCINE) 1000 UNIT/ML DIALYSIS: 1000.0000 [IU] | INTRAMUSCULAR | Status: DC | PRN

## 2022-01-12 MED ORDER — PROPOFOL 10 MG/ML IV BOLUS
INTRAVENOUS | Status: AC
  Filled 2022-01-12: qty 20

## 2022-01-12 MED ORDER — HEPARIN 6000 UNIT IRRIGATION SOLUTION
Status: DC | PRN
  Administered 2022-01-12: 1

## 2022-01-12 MED ORDER — ALBUMIN HUMAN 5 % IV SOLN
INTRAVENOUS | Status: AC
  Administered 2022-01-12: 12.5 g via INTRAVENOUS
  Filled 2022-01-12: qty 250

## 2022-01-12 MED ORDER — LIDOCAINE-PRILOCAINE 2.5-2.5 % EX CREA
1.0000 | TOPICAL_CREAM | CUTANEOUS | Status: DC | PRN
  Filled 2022-01-12: qty 5

## 2022-01-12 MED ORDER — MIDAZOLAM HCL 2 MG/2ML IJ SOLN
INTRAMUSCULAR | Status: DC | PRN
  Administered 2022-01-12: 2 mg via INTRAVENOUS

## 2022-01-12 SURGICAL SUPPLY — 40 items
ARMBAND PINK RESTRICT EXTREMIT (MISCELLANEOUS) ×1 IMPLANT
BENZOIN TINCTURE PRP APPL 2/3 (GAUZE/BANDAGES/DRESSINGS) ×1 IMPLANT
CANISTER SUCT 3000ML PPV (MISCELLANEOUS) ×1 IMPLANT
CANNULA VESSEL 3MM 2 BLNT TIP (CANNULA) ×1 IMPLANT
CHLORAPREP W/TINT 26 (MISCELLANEOUS) ×1 IMPLANT
CLIP LIGATING EXTRA MED SLVR (CLIP) ×1 IMPLANT
CLIP LIGATING EXTRA SM BLUE (MISCELLANEOUS) ×1 IMPLANT
CLSR STERI-STRIP ANTIMIC 1/2X4 (GAUZE/BANDAGES/DRESSINGS) IMPLANT
COVER PROBE W GEL 5X96 (DRAPES) IMPLANT
DRSG TEGADERM 4X4.75 (GAUZE/BANDAGES/DRESSINGS) IMPLANT
ELECT REM PT RETURN 9FT ADLT (ELECTROSURGICAL) ×1
ELECTRODE REM PT RTRN 9FT ADLT (ELECTROSURGICAL) ×1 IMPLANT
GAUZE SPONGE 4X4 12PLY STRL (GAUZE/BANDAGES/DRESSINGS) IMPLANT
GLOVE BIO SURGEON STRL SZ8 (GLOVE) ×1 IMPLANT
GLOVE BIOGEL PI IND STRL 6.5 (GLOVE) IMPLANT
GLOVE BIOGEL PI IND STRL 7.0 (GLOVE) IMPLANT
GOWN STRL REUS W/ TWL LRG LVL3 (GOWN DISPOSABLE) ×2 IMPLANT
GOWN STRL REUS W/ TWL XL LVL3 (GOWN DISPOSABLE) ×1 IMPLANT
GOWN STRL REUS W/TWL LRG LVL3 (GOWN DISPOSABLE) ×1
GOWN STRL REUS W/TWL XL LVL3 (GOWN DISPOSABLE) ×1
GRAFT VASC ACUSEAL 4-7X45 (Vascular Products) IMPLANT
INSERT FOGARTY SM (MISCELLANEOUS) IMPLANT
KIT BASIN OR (CUSTOM PROCEDURE TRAY) ×1 IMPLANT
KIT TURNOVER KIT B (KITS) ×1 IMPLANT
NDL 18GX1X1/2 (RX/OR ONLY) (NEEDLE) IMPLANT
NEEDLE 18GX1X1/2 (RX/OR ONLY) (NEEDLE) IMPLANT
NS IRRIG 1000ML POUR BTL (IV SOLUTION) ×1 IMPLANT
PACK CV ACCESS (CUSTOM PROCEDURE TRAY) ×1 IMPLANT
PAD ARMBOARD 7.5X6 YLW CONV (MISCELLANEOUS) ×2 IMPLANT
SLING ARM FOAM STRAP LRG (SOFTGOODS) IMPLANT
SLING ARM FOAM STRAP MED (SOFTGOODS) IMPLANT
STRIP CLOSURE SKIN 1/2X4 (GAUZE/BANDAGES/DRESSINGS) ×1 IMPLANT
SUT MNCRL AB 4-0 PS2 18 (SUTURE) ×1 IMPLANT
SUT PROLENE 6 0 BV (SUTURE) ×1 IMPLANT
SUT VIC AB 3-0 SH 27 (SUTURE) ×1
SUT VIC AB 3-0 SH 27X BRD (SUTURE) ×1 IMPLANT
SYR 3ML LL SCALE MARK (SYRINGE) IMPLANT
TOWEL GREEN STERILE (TOWEL DISPOSABLE) ×1 IMPLANT
UNDERPAD 30X36 HEAVY ABSORB (UNDERPADS AND DIAPERS) ×1 IMPLANT
WATER STERILE IRR 1000ML POUR (IV SOLUTION) ×1 IMPLANT

## 2022-01-12 NOTE — Progress Notes (Signed)
Received patient in bed to unit.  Alert and oriented.  Informed consent signed and in chart.   Treatment initiated:1540 Treatment completed: 1812  Patient tolerated well.  Transported back to the room  Alert, without acute distress.  Hand-off given to patient's nurse.   Access used: Avgraft (L) arm (NEW) Access issues: c/o soreness.   Total UF removed: 0 Medication(s) given: none Post HD VS: 97.8,106/74,100%,14,84 Post HD weight: 69.4   [facility]

## 2022-01-12 NOTE — Anesthesia Postprocedure Evaluation (Signed)
Anesthesia Post Note  Patient: [name]  Procedure(s) Performed: ARTERIOVENOUS (AV) FISTULA CREATION LEFT UPPER EXTREMITY WITH 4-7MM X 45CM GORE ACUSEAL VASCULAR GRAFT. (Left: Arm Lower)     Patient location during evaluation: PACU Anesthesia Type: Regional and MAC Level of consciousness: awake and alert Pain management: pain level controlled Vital Signs Assessment: post-procedure vital signs reviewed and stable Respiratory status: spontaneous breathing, nonlabored ventilation, respiratory function stable and patient connected to nasal cannula oxygen Cardiovascular status: stable and blood pressure returned to baseline Postop Assessment: no apparent nausea or vomiting Anesthetic complications: no   No notable events documented.  Last Vitals:  Vitals:   01/12/22 1600 01/12/22 1630  BP: 110/78 112/85  Pulse: 80 81  Resp: 11 15  Temp:    SpO2: 100% 100%    Last Pain:  Vitals:   01/12/22 1510  TempSrc: Axillary  PainSc:                  March Rummage Jolynne Spurgin

## 2022-01-12 NOTE — Progress Notes (Signed)
Requested to see pt for out-pt HD needs at d/c. Attempted to see pt while pt receiving HD but staff working with pt and pt not available at this time. Will attempt to meet with pt tomorrow morning.   Melven Sartorius Renal Navigator [phone]

## 2022-01-12 NOTE — Anesthesia Preprocedure Evaluation (Addendum)
Anesthesia Evaluation  Patient identified by MRN, date of birth, ID band Patient awake    Reviewed: Allergy & Precautions, Patient's Chart, lab work & pertinent test results  Airway Mallampati: II  TM Distance: >3 FB Neck ROM: Full    Dental no notable dental hx.    Pulmonary neg pulmonary ROS,    Pulmonary exam normal        Cardiovascular hypertension, Pt. on home beta blockers and Pt. on medications +CHF  + dysrhythmias Atrial Fibrillation + Cardiac Defibrillator + Valvular Problems/Murmurs MR  Rhythm:Regular Rate:Normal  LVAD  ECHO: Left ventricular ejection fraction, by estimation, is 40 to 45%. The left ventricle has mildly decreased function. The left ventricle demonstrates global hypokinesis. There is moderate concentric left ventricular hypertrophy. Indeterminate diastolic filling due to E-A fusion.  Right ventricular systolic function is normal. The right ventricular size is normal. There is normal pulmonary artery systolic pressure. The estimated right ventricular systolic pressure is 69.6 mmHg.  Left atrial size was severely dilated.  The mitral valve is normal in structure. Trivial mitral valve  regurgitation.  The aortic valve is tricuspid. Aortic valve regurgitation is not visualized.    Neuro/Psych negative neurological ROS     GI/Hepatic Neg liver ROS, GERD  Medicated,  Endo/Other  negative endocrine ROS  Renal/GU ESRFRenal disease     Musculoskeletal  (+) Arthritis ,   Abdominal Normal abdominal exam  (+)   Peds  Hematology  (+) Blood dyscrasia, anemia , Thrombocytopenia    Anesthesia Other Findings ESRD  Reproductive/Obstetrics                            Anesthesia Physical Anesthesia Plan  ASA: 4  Anesthesia Plan: MAC and Regional   Post-op Pain Management: Regional block*   Induction: Intravenous  PONV Risk Score and Plan: 1 and Propofol infusion, Treatment  may vary due to age or medical condition and Midazolam  Airway Management Planned: Simple Face Mask, Natural Airway and Nasal Cannula  Additional Equipment: None  Intra-op Plan:   Post-operative Plan:   Informed Consent: I have reviewed the patients History and Physical, chart, labs and discussed the procedure including the risks, benefits and alternatives for the proposed anesthesia with the patient or authorized representative who has indicated his/her understanding and acceptance.     Dental advisory given  Plan Discussed with: CRNA  Anesthesia Plan Comments: (Lab Results      Component                Value               Date                      WBC                      5.1                 01/11/2022                HGB                      10.6 (L)            01/11/2022                HCT  35.9 (L)            01/11/2022                MCV                      68.3 (L)            01/11/2022                PLT                      94 (L)              01/11/2022           Lab Results      Component                Value               Date                      NA                       135                 01/11/2022                K                        3.8                 01/11/2022                CO2                      19 (L)              01/11/2022                GLUCOSE                  92                  01/11/2022                BUN                      153 (H)             01/11/2022                CREATININE               12.05 (H)           01/11/2022                CALCIUM                  8.1 (L)             01/11/2022                EGFR                     50 (L)              03/02/2017  GFRNONAA                 5 (L)               01/11/2022          )      Anesthesia Quick Evaluation

## 2022-01-12 NOTE — Progress Notes (Signed)
[facility] [place] PROGRESS NOTE  ASSESSMENT / PLAN: [name] is a 51 y.o. male with ESRD. Failed left brachiocephalic arteriovenous fistula. Plan left arm early access arteriovenous graft today in OR.   SUBJECTIVE: No complaints. Getting block on my evaluation.  OBJECTIVE: BP 98/79   Pulse 83   Temp 97.9 F (36.6 C) (Oral)   Resp 11   Ht 5\' 7"  (1.702 m)   Wt 69.4 kg   SpO2 100%   BMI 23.96 kg/m   Intake/Output Summary (Last 24 hours) at 01/12/2022 0905 Last data filed at 01/12/2022 5597 Gross per 24 hour  Intake 490 ml  Output 300 ml  Net 190 ml    No distress Regular rate and rhythm Unlabored breathing Thrombosed left brachiocephalic arteriovenous fistula 2+ DP pulses     Latest Ref Rng & Units 01/11/2022   10:56 AM 01/09/2022   10:17 AM 11/05/2021    9:31 AM  CBC  WBC 4.0 - 10.5 K/uL 5.1  5.0    Hemoglobin 13.0 - 17.0 g/dL 10.6  14.7  12.3   Hematocrit 39.0 - 52.0 % 35.9  50.4    Platelets 150 - 400 K/uL 94  118          Latest Ref Rng & Units 01/11/2022   10:56 AM 01/10/2022    7:50 AM 01/09/2022   10:17 AM  CMP  Glucose 70 - 99 mg/dL 92  112  96   BUN 6 - 20 mg/dL 153  163  184   Creatinine 0.61 - 1.24 mg/dL 12.05  12.82  14.28   Sodium 135 - 145 mmol/L 135  137  130   Potassium 3.5 - 5.1 mmol/L 3.8  4.8  5.0   Chloride 98 - 111 mmol/L 101  102  91   CO2 22 - 32 mmol/L 19  19  15    Calcium 8.9 - 10.3 mg/dL 8.1  8.0  9.1   Total Protein 6.5 - 8.1 g/dL   7.1   Total Bilirubin 0.3 - 1.2 mg/dL   0.8   Alkaline Phos 38 - 126 U/L   152   AST 15 - 41 U/L   21   ALT 0 - 44 U/L   6     Estimated Creatinine Clearance: 6.6 mL/min (A) (by C-G formula based on SCr of 12.05 mg/dL (H)).  [name]. Stanford Breed, MD [facility] Phone Number: [phone] 01/12/2022 9:05 AM

## 2022-01-12 NOTE — Anesthesia Procedure Notes (Signed)
  Anesthesia Regional Block: Supraclavicular block   Pre-Anesthetic Checklist: , timeout performed,  Correct Patient, Correct Site, Correct Laterality,  Correct Procedure, Correct Position, site marked,  Risks and benefits discussed,  Surgical consent,  Pre-op evaluation,  At surgeon's request and post-op pain management  Laterality: Left  Prep: Dura Prep       Needles:  Injection technique: Single-shot  Needle Type: Echogenic Stimulator Needle     Needle Length: 5cm  Needle Gauge: 20     Additional Needles:   Procedures:,,,, ultrasound used (permanent image in chart),,    Narrative:  Start time: 01/12/2022 8:20 AM End time: 01/12/2022 8:24 AM Injection made incrementally with aspirations every 5 mL.  Performed by: Personally  Anesthesiologist: Darral Dash, DO  Additional Notes: Patient identified. Risks/Benefits/Options discussed with patient including but not limited to bleeding, infection, nerve damage, failed block, incomplete pain control. Patient expressed understanding and wished to proceed. All questions were answered. Sterile technique was used throughout the entire procedure. Please see nursing notes for vital signs. Aspirated in 5cc intervals with injection for negative confirmation. Patient was given instructions on fall risk and not to get out of bed. All questions and concerns addressed with instructions to call with any issues or inadequate analgesia.

## 2022-01-12 NOTE — Progress Notes (Signed)
Orthopedic Tech Progress Note Patient Details:  [name] 04/03/1971 [id number]  PACU RN called requesting an ARM SLING   Ortho Devices Type of Ortho Device: Arm sling Ortho Device/Splint Location: LUE Ortho Device/Splint Interventions: Ordered   Post Interventions Patient Tolerated: Well Instructions Provided: Care of device  [name] 01/12/2022, 12:24 PM

## 2022-01-12 NOTE — Transfer of Care (Signed)
Immediate Anesthesia Transfer of Care Note  Patient: [name]  Procedure(s) Performed: ARTERIOVENOUS (AV) FISTULA CREATION LEFT UPPER EXTREMITY WITH 4-7MM X 45CM GORE ACUSEAL VASCULAR GRAFT. (Left: Arm Lower)  Patient Location: PACU  Anesthesia Type:MAC combined with regional for post-op pain  Level of Consciousness: drowsy and patient cooperative  Airway & Oxygen Therapy: Patient Spontanous Breathing and Patient connected to face mask oxygen  Post-op Assessment: Report given to RN and Post -op Vital signs reviewed and stable  Post vital signs: Reviewed and stable  Last Vitals:  Vitals Value Taken Time  BP 119/90 01/12/22 1052  Temp    Pulse    Resp 15 01/12/22 1052  SpO2    Vitals shown include unvalidated device data.  Last Pain:  Vitals:   01/12/22 0905  TempSrc:   PainSc: 0-No pain         Complications: No notable events documented.

## 2022-01-12 NOTE — Op Note (Addendum)
DATE OF SERVICE: 01/12/2022   PATIENT:  [name]   PRE-OPERATIVE DIAGNOSIS:  ESRD   POST-OPERATIVE DIAGNOSIS:  Same   PROCEDURE:   left arm hybrid arteriovenous early access graft (4-65m Accuseal)   SURGEON:  Surgeon(s) and Role:    * HCherre Robins MD - Primary   ASSISTANT: MGerri Lins PA-C   An experienced assistant was required given the complexity of this procedure and the standard of surgical care. My assistant helped with exposure through counter tension, suctioning, ligation and retraction to better visualize the surgical field.  My assistant expedited sewing during the case by following my sutures. Wherever I use the term "we" in the report, my assistant actively helped me with that portion of the procedure.   ANESTHESIA:   regional and MAC   EBL: minimal   BLOOD ADMINISTERED:none   DRAINS: none    LOCAL MEDICATIONS USED:  NONE   SPECIMEN:  none   COUNTS: confirmed correct.   TOURNIQUET:  none   PATIENT DISPOSITION:  PACU - hemodynamically stable.   Delay start of Pharmacological VTE agent (>24hrs) due to surgical blood loss or risk of bleeding: no   INDICATION FOR PROCEDURE: [name] BABICHis a 51y.o. male with ESRD. After careful discussion of risks, benefits, and alternatives the patient was offered left arm arteriovenous early access graft. The patient understood and wished to proceed.   OPERATIVE FINDINGS: proximal anastomosis to patent fistula near the elbow. Distal anastomosis to brachial vein near the axilla. Good bruit at completion. Good ulnar signal at completion   DESCRIPTION OF PROCEDURE: After identification of the patient in the pre-operative holding area, the patient was transferred to the operating room. The patient was positioned supine on the operating room table. Anesthesia was induced. The left arm was prepped and draped in standard fashion. A surgical pause was performed confirming correct patient, procedure, and operative  location.   Transverse incision was made over the distal arm over the course of the thrombosed brachiocephalic arteriovenous fistula.  The proximal aspect of the fistula remained patent by physical exam.  Incision was carried out subtenons tissue until the fistula was encountered.  Indeed there was flow in this healthy portion of fistula.  The fistula was encircled.   Attention was turned to the outflow.  A longitudinal incision was made over the brachial vein in the proximal left arm.  Incision was carried down through subcutaneous tissue until the brachial sheath was identified.  This was entered sharply and carefully.  The brachial vein was identified and encircled proximally and distally with Silastic Vesseloops.   A curved tunneling device was used to create a subcutaneous tunnel connecting the 2 exposures.  A 4-7 mm PTFE AcuSeal early access graft was brought onto the field and prepared per the manufacturer's instructions.  The graft was delivered through the subcutaneous tunnel without twisting or kinking.   Peripherally in the arm, the fistula was clamped proximally and distally and divided.  The central aspect was oversewn with silk suture.  The proximal aspect was anastomosed end to end to the vascular graft using continuous running suture of 6-0 Prolene.  The anastomosis was completed and flushed on the open end of the vascular graft.  Excellent inflow was achieved.   The distal aspect of the graft was spatulated to allow end-to-side anastomosis to the brachial vein.  The brachial vein was clamped proximally distally.  An anterior venotomy was made with 11 blade and extended with Potts scissors.  The central anastomosis was performed using continuous running suture of 6-0 Prolene in end-to-side fashion.  Really prior to completion the anastomosis was flushed and de-aired.  Clamps were released on the brachial vein.  Clamps were released on the inflow.  A palpable thrill was felt in the fistula  portion of the access at the elbow.  Doppler flow was confirmed in the outflow vein where a strong bruit was heard.  At the wrist and ulnar signal was heard.  Satisfied with the case here.   Wounds were closed in layers using 3-0 Vicryl and 4-0 Monocryl.  Benzoin and Steri-Strips were applied.  Sterile bandages were applied.   Upon completion of the case instrument and sharps counts were confirmed correct. The patient was transferred to the PACU in good condition. I was present for all portions of the procedure.   [name]. Stanford Breed, MD [facility] Phone Number: [phone] 01/12/2022 10:45 AM

## 2022-01-12 NOTE — Progress Notes (Signed)
Subjective:   Quick stick RUE AVG placed with Dr. Stanford Breed this AM For HD#1 today No c/o at this time Seen in room  Objective Vital signs in last 24 hours: Vitals:   01/12/22 1144 01/12/22 1145 01/12/22 1200 01/12/22 1218  BP: 124/74 118/69 116/69 111/71  Pulse: 79 78 78 78  Resp: 18 (!) 21 17 15   Temp: 97.8 F (36.6 C)   98.2 F (36.8 C)  TempSrc:      SpO2: 95% 98% 98% 99%  Weight:      Height:       Weight change:   Intake/Output Summary (Last 24 hours) at 01/12/2022 1454 Last data filed at 01/12/2022 1300 Gross per 24 hour  Intake 1050 ml  Output 510 ml  Net 540 ml     Assessment/Plan: 33M new ESRD, hx/o heart transplant  1.Renal- New ESRD 2/2 CNIs Starting iHD CLIP in process S/p LUE Quickstick AVG 01/12/22 HD#1 today  2. Hypertension/volume  - Cont carved; hold hydral with anticipated HD 3. Metabolic acidosis- can stop oral HCO3 once HD starts 4. N/V-  due to uremia-  symptomatic treatment  5. Anemia  - is not issue right now 5. S/p heart transplant-  continue the prograf, cellcept and pred  6. BMD: hyperphosphatemia, trend with HD for now.    Rexene Agent    Labs: Basic Metabolic Panel: Recent Labs  Lab 01/10/22 0750 01/11/22 1056 01/12/22 0751 01/12/22 0904  NA 137 135 137 136  K 4.8 3.8 3.9 3.9  CL 102 101 102 102  CO2 19* 19* 20*  --   GLUCOSE 112* 92 82 83  BUN 163* 153* 145* >130*  CREATININE 12.82* 12.05* 11.92* 12.40*  CALCIUM 8.0* 8.1* 8.3*  --   PHOS 8.0* 7.7* 7.8*  --     Liver Function Tests: Recent Labs  Lab 01/09/22 1017 01/10/22 0750 01/11/22 1056 01/12/22 0751  AST 21  --   --   --   ALT 6  --   --   --   ALKPHOS 152*  --   --   --   BILITOT 0.8  --   --   --   PROT 7.1  --   --   --   ALBUMIN 3.9 3.2* 3.1* 3.1*    Recent Labs  Lab 01/09/22 1953  LIPASE 106*    No results for input(s): "AMMONIA" in the last 168 hours. CBC: Recent Labs  Lab 01/09/22 1017 01/11/22 1056 01/12/22 0751 01/12/22 0904  WBC  5.0 5.1 4.3  --   NEUTROABS 3.4  --   --   --   HGB 14.7 10.6* 10.3* 12.6*  HCT 50.4 35.9* 35.8* 37.0*  MCV 69.1* 68.3* 69.2*  --   PLT 118* 94* 89*  --     Cardiac Enzymes: No results for input(s): "CKTOTAL", "CKMB", "CKMBINDEX", "TROPONINI" in the last 168 hours. CBG: Recent Labs  Lab 01/11/22 1638 01/11/22 2107 01/12/22 0726 01/12/22 1057 01/12/22 1216  GLUCAP 115* 144* 92 87 91     Iron Studies: No results for input(s): "IRON", "TIBC", "TRANSFERRIN", "FERRITIN" in the last 72 hours. Studies/Results: No results found. Medications: Infusions:  anticoagulant sodium citrate      Scheduled Medications:  carvedilol  12.5 mg Oral BID   Chlorhexidine Gluconate Cloth  6 each Topical Q0600   citalopram  40 mg Oral Daily   ferrous sulfate  325 mg Oral BID WC   heparin  5,000 Units Subcutaneous Q8H  hydrALAZINE  10 mg Oral TID   insulin aspart  0-9 Units Subcutaneous TID WC   mycophenolate  500 mg Oral BID   pantoprazole  40 mg Oral Daily   pravastatin  40 mg Oral Daily   predniSONE  5 mg Oral Daily   sodium bicarbonate  1,300 mg Oral BID   tacrolimus  2 mg Oral QHS   tacrolimus  3 mg Oral Daily    have reviewed scheduled and prn medications.  Physical Exam: General:  NAD-   f Heart: tachy Lungs: mostly clear Abdomen: soft, non tender Extremities: no edema LUE AVG, +B/T, bandaged   01/12/2022,2:54 PM  LOS: 3 days

## 2022-01-12 NOTE — Progress Notes (Signed)
HD#3 Subjective:  No Overnight Events  Interim history: Patient was seen this afternoon in bed. Patient was somnolent from this morning's procedure. He denies N/V and abdominal pain.  Objective:  Vital signs in last 24 hours: Vitals:   01/12/22 1144 01/12/22 1145 01/12/22 1200 01/12/22 1218  BP: 124/74 118/69 116/69 111/71  Pulse: 79 78 78 78  Resp: 18 (!) 21 17 15   Temp: 97.8 F (36.6 C)   98.2 F (36.8 C)  TempSrc:      SpO2: 95% 98% 98% 99%  Weight:      Height:       Supplemental O2: Room Air SpO2: 99 % O2 Flow Rate (L/min): 5 L/min   Physical Exam:  General: Pleasant, well-appearing in bed. No acute distress. CV: RRR. No murmurs, rubs, or gallops.  Pulmonary: Normal effort. No wheezing or rales. Abdominal: Soft, nontender, nondistended. Normal bowel sounds. Ostomy bag on the left lower quadrant.   Skin: Warm and dry. No obvious rash or lesions. Neuro: A&Ox3. No focal deficit. No asterixis seen.   Filed Weights   01/09/22 0902 01/09/22 1821 01/12/22 0833  Weight: 69.4 kg 63.3 kg 69.4 kg     Intake/Output Summary (Last 24 hours) at 01/12/2022 1408 Last data filed at 01/12/2022 1043 Gross per 24 hour  Intake 990 ml  Output 310 ml  Net 680 ml   Net IO Since Admission: 1,602.64 mL [01/12/22 1408]  Pertinent Labs:    Latest Ref Rng & Units 01/12/2022    9:04 AM 01/12/2022    7:51 AM 01/11/2022   10:56 AM  CBC  WBC 4.0 - 10.5 K/uL  4.3  5.1   Hemoglobin 13.0 - 17.0 g/dL 12.6  10.3  10.6   Hematocrit 39.0 - 52.0 % 37.0  35.8  35.9   Platelets 150 - 400 K/uL  89  94        Latest Ref Rng & Units 01/12/2022    9:04 AM 01/12/2022    7:51 AM 01/11/2022   10:56 AM  CMP  Glucose 70 - 99 mg/dL 83  82  92   BUN 6 - 20 mg/dL >130  145  153   Creatinine 0.61 - 1.24 mg/dL 12.40  11.92  12.05   Sodium 135 - 145 mmol/L 136  137  135   Potassium 3.5 - 5.1 mmol/L 3.9  3.9  3.8   Chloride 98 - 111 mmol/L 102  102  101   CO2 22 - 32 mmol/L  20  19   Calcium 8.9  - 10.3 mg/dL  8.3  8.1     Imaging: No results found.  Assessment/Plan:   Principal Problem:   Acute on chronic renal failure (HCC) Active Problems:   Essential hypertension, benign   Chronic systolic congestive heart failure (HCC)   ESRD (end stage renal disease) ([place])   Patient Summary: [name] is a 51 y.o. male with hx of HTN, CKD4, diverticulitis s/p ileostomy in 2020 who presents to the hospital due to two day history of nausea/vomiting and generalized abdominal pain and admitted due to uremic symptoms likely secondary to progressive renal failure.   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  There is no obvious reversible process.  No evidence of uremia on exam.  Na and K WNL. CO2 fairly stable in the past two days at 20. Vascular surgery performed left arm early access arteriovenous graft placement  this morning. -Nephrology and vascular surgery following  -Continue sodium bicarbonate 1300 mg BID -Trend renal function panel -Avoid nephrotoxic agents -Unable to contact his heart transplant at [facility] on the weekend.  Voicemail was left to call back   Abdominal pain-resolved No underlying acute pathology on CT abdomen/pelvis.  Suspect secondary to uremia from ESRD.  Abdominal pain has resolved and patient is able to tolerate p.o. intake.  History of heart transplant Thrombocytopenia Heart transplant done in 2019. Patient had a history of severe mitral and tricuspid valve regurgitation and ventricular fibrillation. Prednisone, mycophenolate, and tacrolimus which have all been resumed.  Platelets decreasing, now at 89,  likely secondary to his tacrolimus and mycophenolate. -Pending tacrolimus levels -Continue cellcept 500 mg BID -Continue prednisone 5 mg daily -Continue tacrolimus 3 mg in AM and 2 mg at bedtime   Hypertension Blood pressure has been well controlled, was hypotensive most likely during  the procedure. He is currently normotensive. -Hydralazine 10 mg TID, holding if SBP is <120 -Continue coreg 12.5 mg BID   Microcytic anemia  No evidence of iron deficiency on blood work in July.  Patient is on iron supplement. Hemoglobin trending: 14.7>10.6. Pending morning CBC. -Continue ferrous sulfate 325 mg BID with meals -Trend CBC   Hypertriglyceridemia Previous lipid panel in 2019 showed elevated triglycerides of 257. Patient is currently on pravastatin. Lipid panel showed improved triglycerides to 230 and LDL at goal at 65. -Continue pravastatin 40 mg daily -Encourage lifestyle modifications with dietary changes and exercise   Diabetes Patient's last A1c was 7.2 in 2016. Repeat A1c improved to 6.1%. CBGs have been well controlled -Continue SSI -CBG monitoring  Diet: Renal IVF: None,None VTE: Heparin Code: Full PT/OT recs: None, none. TOC recs:   Dispo: Anticipated discharge to Home in 3 days pending AVF placement and possible HD.   Stormy Fabian, MD 01/12/2022, 2:08 PM Pager: [phone]  Please contact the on call pager after 5 pm and on weekends at [phone].

## 2022-01-13 ENCOUNTER — Encounter (HOSPITAL_COMMUNITY): Payer: Self-pay | Admitting: Vascular Surgery

## 2022-01-13 DIAGNOSIS — N186 End stage renal disease: Secondary | ICD-10-CM | POA: Diagnosis not present

## 2022-01-13 DIAGNOSIS — N170 Acute kidney failure with tubular necrosis: Secondary | ICD-10-CM | POA: Diagnosis not present

## 2022-01-13 DIAGNOSIS — N185 Chronic kidney disease, stage 5: Secondary | ICD-10-CM | POA: Diagnosis not present

## 2022-01-13 DIAGNOSIS — I5022 Chronic systolic (congestive) heart failure: Secondary | ICD-10-CM | POA: Diagnosis not present

## 2022-01-13 LAB — CBC
HCT: 35.8 % — ABNORMAL LOW (ref 39.0–52.0)
Hemoglobin: 10.2 g/dL — ABNORMAL LOW (ref 13.0–17.0)
MCH: 20 pg — ABNORMAL LOW (ref 26.0–34.0)
MCHC: 28.5 g/dL — ABNORMAL LOW (ref 30.0–36.0)
MCV: 70.2 fL — ABNORMAL LOW (ref 80.0–100.0)
Platelets: 86 10*3/uL — ABNORMAL LOW (ref 150–400)
RBC: 5.1 MIL/uL (ref 4.22–5.81)
RDW: 17.2 % — ABNORMAL HIGH (ref 11.5–15.5)
WBC: 5.5 10*3/uL (ref 4.0–10.5)
nRBC: 0 % (ref 0.0–0.2)

## 2022-01-13 LAB — GLUCOSE, CAPILLARY
Glucose-Capillary: 103 mg/dL — ABNORMAL HIGH (ref 70–99)
Glucose-Capillary: 109 mg/dL — ABNORMAL HIGH (ref 70–99)
Glucose-Capillary: 153 mg/dL — ABNORMAL HIGH (ref 70–99)

## 2022-01-13 LAB — RENAL FUNCTION PANEL
Albumin: 3.2 g/dL — ABNORMAL LOW (ref 3.5–5.0)
Anion gap: 9 (ref 5–15)
BUN: 110 mg/dL — ABNORMAL HIGH (ref 6–20)
CO2: 21 mmol/L — ABNORMAL LOW (ref 22–32)
Calcium: 7.7 mg/dL — ABNORMAL LOW (ref 8.9–10.3)
Chloride: 104 mmol/L (ref 98–111)
Creatinine, Ser: 8.85 mg/dL — ABNORMAL HIGH (ref 0.61–1.24)
GFR, Estimated: 7 mL/min — ABNORMAL LOW (ref >60)
Glucose, Bld: 102 mg/dL — ABNORMAL HIGH (ref 70–99)
Phosphorus: 5.7 mg/dL — ABNORMAL HIGH (ref 2.5–4.6)
Potassium: 4.2 mmol/L (ref 3.5–5.1)
Sodium: 134 mmol/L — ABNORMAL LOW (ref 135–145)

## 2022-01-13 LAB — TACROLIMUS LEVEL: Tacrolimus (FK506) - LabCorp: 2.6 ng/mL (ref 2.0–20.0)

## 2022-01-13 MED ORDER — CARVEDILOL 3.125 MG PO TABS
3.1250 mg | ORAL_TABLET | Freq: Two times a day (BID) | ORAL | Status: DC
  Administered 2022-01-13 – 2022-01-14 (×2): 3.125 mg via ORAL
  Filled 2022-01-13 (×2): qty 1

## 2022-01-13 MED ORDER — PREDNISONE 50 MG PO TABS
60.0000 mg | ORAL_TABLET | Freq: Every day | ORAL | Status: DC
  Administered 2022-01-13: 60 mg via ORAL
  Filled 2022-01-13: qty 1

## 2022-01-13 MED ORDER — PREDNISONE 50 MG PO TABS: 60.0000 mg | ORAL_TABLET | Freq: Every day | ORAL | Status: DC

## 2022-01-13 MED ORDER — PREDNISONE 50 MG PO TABS
60.0000 mg | ORAL_TABLET | Freq: Every day | ORAL | Status: DC
  Administered 2022-01-14: 60 mg via ORAL
  Filled 2022-01-13: qty 1

## 2022-01-13 NOTE — Plan of Care (Signed)
  Problem: Education: Goal: Ability to describe self-care measures that may prevent or decrease complications (Diabetes Survival Skills Education) will improve Outcome: Progressing   Problem: Coping: Goal: Ability to adjust to condition or change in health will improve Outcome: Progressing   Problem: Fluid Volume: Goal: Ability to maintain a balanced intake and output will improve Outcome: Progressing   Problem: Health Behavior/Discharge Planning: Goal: Ability to identify and utilize available resources and services will improve Outcome: Progressing Goal: Ability to manage health-related needs will improve Outcome: Progressing   Problem: Metabolic: Goal: Ability to maintain appropriate glucose levels will improve Outcome: Progressing   Problem: Nutritional: Goal: Maintenance of adequate nutrition will improve Outcome: Progressing Goal: Progress toward achieving an optimal weight will improve Outcome: Progressing   Problem: Skin Integrity: Goal: Risk for impaired skin integrity will decrease Outcome: Progressing   Problem: Tissue Perfusion: Goal: Adequacy of tissue perfusion will improve Outcome: Progressing   Problem: Clinical Measurements: Goal: Ability to maintain clinical measurements within normal limits will improve Outcome: Progressing Goal: Will remain free from infection Outcome: Progressing Goal: Diagnostic test results will improve Outcome: Progressing Goal: Respiratory complications will improve Outcome: Progressing Goal: Cardiovascular complication will be avoided Outcome: Progressing   Problem: Clinical Measurements: Goal: Will remain free from infection Outcome: Progressing   Problem: Activity: Goal: Risk for activity intolerance will decrease Outcome: Progressing   Problem: Nutrition: Goal: Adequate nutrition will be maintained Outcome: Progressing   Problem: Coping: Goal: Level of anxiety will decrease Outcome: Progressing   Problem:  Elimination: Goal: Will not experience complications related to bowel motility Outcome: Progressing Goal: Will not experience complications related to urinary retention Outcome: Progressing

## 2022-01-13 NOTE — Care Management Important Message (Signed)
Important Message  Patient Details  Name: [name] MRN: [id number] Date of Birth: 02/09/1971   Medicare Important Message Given:  Yes     Yoko Mcgahee Montine Circle 01/13/2022, 2:51 PM

## 2022-01-13 NOTE — Progress Notes (Signed)
Subjective:   HD#1 yesterday using AVG, worked well; no UF CLIP in process Having joint pains Seen in room  Objective Vital signs in last 24 hours: Vitals:   01/12/22 1700 01/12/22 1730 01/12/22 1810 01/12/22 1955  BP: 104/79 103/75 106/74 105/72  Pulse: 80 90  87  Resp: 13 16  18   Temp:    98.1 F (36.7 C)  TempSrc:    Oral  SpO2: 99% 100%  100%  Weight:      Height:       Weight change:   Intake/Output Summary (Last 24 hours) at 01/13/2022 1240 Last data filed at 01/13/2022 0900 Gross per 24 hour  Intake 180 ml  Output 0 ml  Net 180 ml     Assessment/Plan: 70M new ESRD, hx/o heart transplant  1.Renal- New ESRD 2/2 CNIs started [facility] 01/12/22 CLIP in process, likely to TCU S/p LUE Quickstick AVG 01/12/22 HD#2 9/27:  2K, AVG 16g, 250 BFR, 1L UF, No heparin  2. Hypertension/volume  - BPs soft, stopped hydralazine, carvedilol reduced 3. Metabolic acidosis- Stop NaHCO3 4. N/V-  due to uremia-  symptomatic treatment  5. Anemia  - is not issue right now, stop oral Fe will monitor at HD 5. S/p heart transplant-  continue the prograf, cellcept and pred  6. BMD: hyperphosphatemia, trend with HD for now.   7. Gout? Primary increased steroids  Rexene Agent    Labs: Basic Metabolic Panel: Recent Labs  Lab 01/11/22 1056 01/12/22 0751 01/12/22 0904 01/13/22 0315  NA 135 137 136 134*  K 3.8 3.9 3.9 4.2  CL 101 102 102 104  CO2 19* 20*  --  21*  GLUCOSE 92 82 83 102*  BUN 153* 145* >130* 110*  CREATININE 12.05* 11.92* 12.40* 8.85*  CALCIUM 8.1* 8.3*  --  7.7*  PHOS 7.7* 7.8*  --  5.7*    Liver Function Tests: Recent Labs  Lab 01/09/22 1017 01/10/22 0750 01/11/22 1056 01/12/22 0751 01/13/22 0315  AST 21  --   --   --   --   ALT 6  --   --   --   --   ALKPHOS 152*  --   --   --   --   BILITOT 0.8  --   --   --   --   PROT 7.1  --   --   --   --   ALBUMIN 3.9   < > 3.1* 3.1* 3.2*   < > = values in this interval not displayed.    Recent Labs  Lab  01/09/22 1953  LIPASE 106*    No results for input(s): "AMMONIA" in the last 168 hours. CBC: Recent Labs  Lab 01/09/22 1017 01/11/22 1056 01/12/22 0751 01/12/22 0904 01/13/22 0721  WBC 5.0 5.1 4.3  --  5.5  NEUTROABS 3.4  --   --   --   --   HGB 14.7 10.6* 10.3* 12.6* 10.2*  HCT 50.4 35.9* 35.8* 37.0* 35.8*  MCV 69.1* 68.3* 69.2*  --  70.2*  PLT 118* 94* 89*  --  86*    Cardiac Enzymes: No results for input(s): "CKTOTAL", "CKMB", "CKMBINDEX", "TROPONINI" in the last 168 hours. CBG: Recent Labs  Lab 01/12/22 1057 01/12/22 1216 01/12/22 1955 01/13/22 0806 01/13/22 1108  GLUCAP 87 91 91 103* 109*     Iron Studies: No results for input(s): "IRON", "TIBC", "TRANSFERRIN", "FERRITIN" in the last 72 hours. Studies/Results: No results found. Medications: Infusions:  anticoagulant sodium citrate      Scheduled Medications:  carvedilol  12.5 mg Oral BID   Chlorhexidine Gluconate Cloth  6 each Topical Q0600   citalopram  40 mg Oral Daily   ferrous sulfate  325 mg Oral Q breakfast   heparin  5,000 Units Subcutaneous Q8H   insulin aspart  0-9 Units Subcutaneous TID WC   mycophenolate  500 mg Oral BID   pantoprazole  40 mg Oral Daily   pravastatin  40 mg Oral Daily   predniSONE  60 mg Oral Daily   sodium bicarbonate  1,300 mg Oral BID   tacrolimus  2 mg Oral QHS   tacrolimus  3 mg Oral Daily    have reviewed scheduled and prn medications.  Physical Exam: General:  NAD-   f Heart: tachy Lungs: mostly clear Abdomen: soft, non tender Extremities: no edema LUE AVG, +B/T, bandaged   01/13/2022,12:40 PM  LOS: 4 days

## 2022-01-13 NOTE — Progress Notes (Signed)
Met with pt at bedside this am to discuss out-pt HD needs at d/c. Introduced self and explained role. Discussed possible options with pt. Pt prefers a [facility] clinic and agreeable to TCU at [facility] (4x's a week). Pt states he is likely not interested in home therapy in the future but agreeable to further HD education that TCU provides. Referral submitted to [facility] admissions this am for review. Pt states he drives and plans to drive self to out-pt HD appts. Will assist as needed.   Melven Sartorius Renal Navigator [phone]

## 2022-01-13 NOTE — Progress Notes (Addendum)
HD#4 Subjective:  NAEON  Interim history: Patient reports pain with swelling in his wrists and ankles, it feels similar to gout exacerbations that he experienced in the past. He has also lost his appetite. Denies nausea, vomiting, and any metallic taste in mouth. On exam ankle and wrist joints are warm. Discussed plan to start hemodialysis and short course of prednisone for his gout flare.   Objective:  Vital signs in last 24 hours: Vitals:   01/12/22 1700 01/12/22 1730 01/12/22 1810 01/12/22 1955  BP: 104/79 103/75 106/74 105/72  Pulse: 80 90  87  Resp: 13 16  18   Temp:    98.1 F (36.7 C)  TempSrc:    Oral  SpO2: 99% 100%  100%  Weight:      Height:       Supplemental O2: Room Air SpO2: 100 % O2 Flow Rate (L/min): 5 L/min   Physical Exam:  General: Uncomfortable, well-appearing in bed. No acute distress. CV: RRR. No murmurs, rubs, or gallops. No LE edema Pulmonary: Normal effort. No wheezing or rales. Extremities: Tenderness in his ankles and wrists bilaterally. Normal ROM. Skin: Right arm feels warmer than left.  Neuro: A&Ox3. Moves all extremities. No focal deficit.  Filed Weights   01/09/22 0902 01/09/22 1821 01/12/22 0833  Weight: 69.4 kg 63.3 kg 69.4 kg     Intake/Output Summary (Last 24 hours) at 01/13/2022 0836 Last data filed at 01/12/2022 1810 Gross per 24 hour  Intake 560 ml  Output 10 ml  Net 550 ml   Net IO Since Admission: 1,462.64 mL [01/13/22 0836]  Pertinent Labs:    Latest Ref Rng & Units 01/13/2022    7:21 AM 01/12/2022    9:04 AM 01/12/2022    7:51 AM  CBC  WBC 4.0 - 10.5 K/uL 5.5   4.3   Hemoglobin 13.0 - 17.0 g/dL 10.2  12.6  10.3   Hematocrit 39.0 - 52.0 % 35.8  37.0  35.8   Platelets 150 - 400 K/uL 86   89        Latest Ref Rng & Units 01/13/2022    3:15 AM 01/12/2022    9:04 AM 01/12/2022    7:51 AM  CMP  Glucose 70 - 99 mg/dL 102  83  82   BUN 6 - 20 mg/dL 110  >130  145   Creatinine 0.61 - 1.24 mg/dL 8.85  12.40  11.92    Sodium 135 - 145 mmol/L 134  136  137   Potassium 3.5 - 5.1 mmol/L 4.2  3.9  3.9   Chloride 98 - 111 mmol/L 104  102  102   CO2 22 - 32 mmol/L 21   20   Calcium 8.9 - 10.3 mg/dL 7.7   8.3     Imaging: No results found.  Assessment/Plan:   Principal Problem:   Acute on chronic renal failure (HCC) Active Problems:   Essential hypertension, benign   Chronic systolic congestive heart failure (HCC)   ESRD (end stage renal disease) ([place])   Patient Summary: [name] is a 51 y.o. male with hx of HTN, CKD4, diverticulitis s/p ileostomy in 2020 who presents to the hospital due to two day history of nausea/vomiting and generalized abdominal pain and admitted due to uremic symptoms likely secondary to progressive renal failure. AV graft placed and HD started.   Progressive renal failure, CKD IV to ESRD Anion gap metabolic acidosis 2/2 uremia Abdominal pain-resolved CKD now progressed to ESRD.  No evidence of uremia on exam. Na decreased slightly:136>134. CO2 fairly stable in the past two days, now at 21. Vascular surgery performed left arm early access arteriovenous graft placement yesterday, HD #1 yesterday. -Nephrology and vascular surgery following  -D/c sodium bicarbonate 1300 mg BID -Trend renal function panel -Avoid nephrotoxic agents -Unable to contact his heart transplant at [facility] on the weekend.  Voicemail was left to call back  Multiple joint pain Patient has a history of gout. He reports similar pain in his ankles and wrists that he experienced previously during gout episodes. Using the clinical diagnostic rule, there is an intermediate probability that the patient is experiencing gout. He notes prednisone alleviated his pain in the past so we will increase his prednisone dose. -Prednisone increased from 5 mg to 60 mg daily for five days, plan to taper down if symptoms of joint pain resolve  History of heart transplant Thrombocytopenia Heart transplant done in 2019.  Patient had a history of severe mitral and tricuspid valve regurgitation and ventricular fibrillation. Prednisone, mycophenolate, and tacrolimus which have all been resumed. Patient's tacrolimus level is 2.6. Platelets decreasing, now at 86,  likely secondary to his tacrolimus and mycophenolate. -Prednisone increased from 5 to 60 mg daily for five days, plan to taper down if symptoms of joint pain resolve -Continue cellcept 500 mg BID -Continue tacrolimus 3 mg in AM and 2 mg at bedtime   Microcytic anemia  No evidence of iron deficiency on blood work in July.  Patient is on iron supplement. Hemoglobin trending: 12.6>10.2. Per nephrology, anemia is not an issue at this time. -D/c ferrous sulfate 325 mg daily with breakfast -Trend CBC   Hypertriglyceridemia Previous lipid panel in 2019 showed elevated triglycerides of 257. Patient is currently on pravastatin. Lipid panel showed improved triglycerides to 230 and LDL at goal at 65. -Continue pravastatin 40 mg daily -Encourage lifestyle modifications with dietary changes and exercise  Hypertension Blood pressure has been well controlled, fairly soft. Appreciate nephrology recs. -D/c hydralazine -Decrease coreg 12.5 mg to 3.125 mg BID  Diabetes Patient's last A1c was 7.2 in 2016. Repeat A1c improved to 6.1%. CBGs have been well controlled. -Continue SSI -CBG monitoring  Diet: Renal IVF: None,None VTE: Heparin Code: Full PT/OT recs: None, none. TOC recs:   Dispo: Anticipated discharge to Home in 2 days pending AVF placement and possible HD.   Stormy Fabian, MD 01/13/2022, 8:36 AM Pager: [phone]  Please contact the on call pager after 5 pm and on weekends at [phone].

## 2022-01-13 NOTE — Progress Notes (Addendum)
[facility]  Subjective  - left wrist pain with history of gout   Objective 105/72 87 98.1 F (36.7 C) (Oral) 18 100%  Intake/Output Summary (Last 24 hours) at 01/13/2022 0017 Last data filed at 01/12/2022 1810 Gross per 24 hour  Intake 560 ml  Output 210 ml  Net 350 ml    Left UE incision healing well Left UE N/M/V intact no frank steal symptoms, palpable riadial pulse Lungs non labored breathing  Assessment/Planning: POD #1 left UE quickstick AV graft Graft used for successful HD yesterday.   Gout left wrist and B feet  Will re examine tomorrow.  If he develops a change in his symptoms to include motor loss, cold hand or sensation loss please contact [facility].    Roxy Horseman 01/13/2022 6:55 AM --  Laboratory Lab Results: Recent Labs    01/11/22 1056 01/12/22 0751 01/12/22 0904  WBC 5.1 4.3  --   HGB 10.6* 10.3* 12.6*  HCT 35.9* 35.8* 37.0*  PLT 94* 89*  --    BMET Recent Labs    01/12/22 0751 01/12/22 0904 01/13/22 0315  NA 137 136 134*  K 3.9 3.9 4.2  CL 102 102 104  CO2 20*  --  21*  GLUCOSE 82 83 102*  BUN 145* >130* 110*  CREATININE 11.92* 12.40* 8.85*  CALCIUM 8.3*  --  7.7*    COAG Lab Results  Component Value Date   INR 1.02 08/06/2017   INR 2.10 04/15/2017   INR 2.55 04/07/2017   PROTIME 19.9 09/18/2008   No results found for: "PTT"   VASCULAR STAFF ADDENDUM: I have independently interviewed and examined the patient. I agree with the above.   [name]. Stanford Breed, MD [facility] Phone Number: [phone] 01/13/2022 8:41 AM

## 2022-01-14 ENCOUNTER — Encounter (HOSPITAL_COMMUNITY): Payer: Self-pay

## 2022-01-14 ENCOUNTER — Other Ambulatory Visit (HOSPITAL_COMMUNITY): Payer: Self-pay

## 2022-01-14 DIAGNOSIS — M1000 Idiopathic gout, unspecified site: Secondary | ICD-10-CM

## 2022-01-14 DIAGNOSIS — N170 Acute kidney failure with tubular necrosis: Secondary | ICD-10-CM | POA: Diagnosis not present

## 2022-01-14 DIAGNOSIS — N186 End stage renal disease: Secondary | ICD-10-CM | POA: Diagnosis not present

## 2022-01-14 DIAGNOSIS — I5022 Chronic systolic (congestive) heart failure: Secondary | ICD-10-CM | POA: Diagnosis not present

## 2022-01-14 LAB — GLUCOSE, CAPILLARY
Glucose-Capillary: 176 mg/dL — ABNORMAL HIGH (ref 70–99)
Glucose-Capillary: 117 mg/dL — ABNORMAL HIGH (ref 70–99)
Glucose-Capillary: 109 mg/dL — ABNORMAL HIGH (ref 70–99)
Glucose-Capillary: 102 mg/dL — ABNORMAL HIGH (ref 70–99)

## 2022-01-14 LAB — CBC
HCT: 37.3 % — ABNORMAL LOW (ref 39.0–52.0)
Hemoglobin: 10.8 g/dL — ABNORMAL LOW (ref 13.0–17.0)
MCH: 20 pg — ABNORMAL LOW (ref 26.0–34.0)
MCHC: 29 g/dL — ABNORMAL LOW (ref 30.0–36.0)
MCV: 69.1 fL — ABNORMAL LOW (ref 80.0–100.0)
Platelets: 105 10*3/uL — ABNORMAL LOW (ref 150–400)
RBC: 5.4 MIL/uL (ref 4.22–5.81)
RDW: 17.2 % — ABNORMAL HIGH (ref 11.5–15.5)
WBC: 7.9 10*3/uL (ref 4.0–10.5)
nRBC: 0 % (ref 0.0–0.2)

## 2022-01-14 LAB — RENAL FUNCTION PANEL
Albumin: 3.2 g/dL — ABNORMAL LOW (ref 3.5–5.0)
Anion gap: 16 — ABNORMAL HIGH (ref 5–15)
BUN: 117 mg/dL — ABNORMAL HIGH (ref 6–20)
CO2: 18 mmol/L — ABNORMAL LOW (ref 22–32)
Calcium: 8.5 mg/dL — ABNORMAL LOW (ref 8.9–10.3)
Chloride: 103 mmol/L (ref 98–111)
Creatinine, Ser: 9.6 mg/dL — ABNORMAL HIGH (ref 0.61–1.24)
GFR, Estimated: 6 mL/min — ABNORMAL LOW (ref >60)
Glucose, Bld: 106 mg/dL — ABNORMAL HIGH (ref 70–99)
Phosphorus: 7.9 mg/dL — ABNORMAL HIGH (ref 2.5–4.6)
Potassium: 4.4 mmol/L (ref 3.5–5.1)
Sodium: 137 mmol/L (ref 135–145)

## 2022-01-14 MED ORDER — CARVEDILOL 3.125 MG PO TABS
3.1250 mg | ORAL_TABLET | Freq: Two times a day (BID) | ORAL | 0 refills | Status: AC
  Filled 2022-01-14: qty 60, 30d supply, fill #0

## 2022-01-14 MED ORDER — PREDNISONE 20 MG PO TABS
60.0000 mg | ORAL_TABLET | Freq: Every day | ORAL | 0 refills | Status: AC
  Filled 2022-01-14: qty 12, 4d supply, fill #0

## 2022-01-14 MED ORDER — PREDNISONE 5 MG PO TABS: 5.0000 mg | ORAL_TABLET | Freq: Every day | ORAL | Status: DC

## 2022-01-14 NOTE — TOC Progression Note (Signed)
Transition of Care [facility]) - Progression Note    Patient Details  Name: GEHRIG PATRAS MRN: [id number] Date of Birth: 1971-03-22  Transition of Care [facility]) CM/SW Contact  Tom-Johnson, Renea Ee, RN Phone Number: 01/14/2022, 2:18 PM  Clinical Narrative:     Patient is scheduled for discharge today after inpatient dialysis treatment.  From home alone. Has three supportive siblings, no children. Hx of Heart transplant at [facility]. Has an Ileostomy, gets supplies from [facility].  Started on dialysis inpatient, clipped at [facility] with Monday/Tuesday/Thursday/Friday schedule at 12:45 chair time.  No TOC needs or recommendations noted. Family to transport at discharge. No further TOC needs noted.     Barriers to Discharge: Barriers Resolved  Expected Discharge Plan and Services           Expected Discharge Date: 01/14/22               DME Arranged: N/A DME Agency: NA       HH Arranged: NA HH Agency: NA         Social Determinants of Health (SDOH) Interventions    Readmission Risk Interventions     No data to display

## 2022-01-14 NOTE — Progress Notes (Addendum)
Pt has been accepted at TCU at [facility]) on Monday/Tuesday/Thursday/Friday 12:45 chair time. Pt can start as soon as tomorrow. Pt will need to arrive at 12:00 to complete paperwork prior to treatment. Met with pt at bedside to go over details. Schedule letter provided to pt with details noted. Pt agreeable to plan. Out-pt HD arrangements added to AVS as well. Update provided to attending, nephrologist, RN, and RN CM. Renal NP aware of clinics need for orders as well. Will assist as needed.   Melven Sartorius Renal Navigator [phone]  Addendum at 1:51 pm: Pt to d/c today after HD treatment #2. Contacted [facility] and spoke to [place], Therapist, sports. Clinic advised pt will d/c today and start at clinic tomorrow. Clinic advised renal NP to send orders.

## 2022-01-14 NOTE — Discharge Summary (Signed)
Name: [name] MRN: [id number] DOB: [birth year] 51 y.o. PCP: Sandi Mariscal, MD  Date of Admission: 01/09/2022  8:53 AM Date of Discharge: 01/14/2022 Attending Physician: Dr. Angelia Mould  Discharge Diagnosis: Principal Problem:   Acute on chronic renal failure [facility]) Active Problems:   Essential hypertension, benign   Acute gout   Chronic systolic congestive heart failure ([place])   ESRD (end stage renal disease) ([place])    Discharge Medications: Allergies as of 01/14/2022       Reactions   Phytonadione Other (See Comments)   Patient has LVAD: please check with LVAD coordinator on call or LVAD MD on call before reversal of anticoagulation with vit k   Amlodipine Other (See Comments)   Leg swelling        Medication List     STOP taking these medications    calcium carbonate 1500 (600 Ca) MG Tabs tablet Commonly known as: OSCAL   ferrous sulfate 325 (65 FE) MG tablet   hydrALAZINE 50 MG tablet Commonly known as: APRESOLINE       TAKE these medications    carvedilol 3.125 MG tablet Commonly known as: COREG Take 1 tablet (3.125 mg total) by mouth 2 (two) times daily. What changed:  medication strength how much to take additional instructions   citalopram 40 MG tablet Commonly known as: CELEXA Take 40 mg by mouth daily.   mycophenolate 500 MG tablet Commonly known as: CELLCEPT Take 500 mg by mouth 2 (two) times daily.   pantoprazole 40 MG tablet Commonly known as: PROTONIX Take 40 mg by mouth daily.   pravastatin 40 MG tablet Commonly known as: PRAVACHOL Take 40 mg by mouth daily.   predniSONE 5 MG tablet Commonly known as: DELTASONE Take 5 mg by mouth daily. What changed: Another medication with the same name was added. Make sure you understand how and when to take each.   predniSONE 20 MG tablet Commonly known as: DELTASONE Take 3 tablets (60 mg total) by mouth daily for 4 days. What changed: You were already taking a medication with the same  name, and this prescription was added. Make sure you understand how and when to take each.   tacrolimus 1 MG capsule Commonly known as: PROGRAF Take 1 mg by mouth 2 (two) times daily. Takes 3 pills in AM and 2 Pills in PM WLAGREENS: take 4 capsules by mouth every 12 hours        Disposition and follow-up:   [name] was discharged from [facility] in Stable condition.  At the hospital follow up visit please address:  1.  Follow-up:  a. ESRD- He has a history of CKD4. Patient was started on HD in the hospital due to nausea/vomiting with elevated creatinine. Please evaluate for signs of uremia.    b. Hypertriglyceridemia- Most recent triglyceride level was 230 and LDL at goal of 65. He is currently on pravastatin.   c. Gout- Patient has ankle and wrist pain in the hospital and a five day course of prednisone 60 mg was given.  Patient will resume his prednisone 5 mg daily after this steroid burst.  Please evaluate and provide medication if symptoms are still present.   d. Hypertension- Well controlled. SBP fairly soft during hostpital stay so medication was reduced to coreg 3.125 mg.  Hydralazine was discontinue.  Please monitor and adjust for better blood pressure control.   E.  Heart transplant-his tacrolimus, mycophenolate and prednisone were continued throughout this admission  2.  Labs / imaging needed at time of follow-up: CBC, BMP  3.  Pending labs/ test needing follow-up: none  Follow-up Appointments:  [facility].   Why: TCU unit- Schedule is Monday/Tuesday/Thursday/Friday with 12:45 chair time.  Please arrivea at 12:00 for first appointment to complete paperwork prior to treatment. Contact information: [place] [id number] [phone]                 Hospital Course by problem list: Progressive renal failure, CKD IV to ESRD Anion gap metabolic acidosis 2/2 uremia CKD4  progressed to ESRD, on admission creatinine was 14. AV graft was placed and patient received two sessions of HD during his hospital stay. Initially, nausea,dry heaving, and abdominal pain was present but resolved during his stay. Sodium and potassium WNL. Patient was accepted at Transitional Care Unit at [place] on Monday/Tuesday/Thursday/Friday 12:45 chair time.    Gout in multiple joints Patient has a history of gout. He reports similar pain in his ankles and wrists that he experienced previously during gout episodes. Using the clinical diagnostic rule, there was an intermediate probability that the patient is experiencing gout. His prednisone was increased from 5 mg to 60 mg daily for five days. Patient will take 60 mg of prednisone outpatient for four more days and taper back down to 5 mg afterwards.   History of heart transplant Thrombocytopenia Heart transplant done in 2019. Patient had a history of severe mitral and tricuspid valve regurgitation and ventricular fibrillation. Prednisone, mycophenolate, and tacrolimus which have all been resumed. Platelets decreased, now at 86,  likely secondary to his tacrolimus and mycophenolate. His home cellcept and tacrolimus was continued.   Hypertriglyceridemia Previous lipid panel in 2019 showed elevated triglycerides of 257. Patient is currently on pravastatin. Lipid panel showed improved triglycerides to 230 and LDL at goal at 65. His home pravastatin 40 mg daily was continued. Encourage lifestyle modifications with dietary changes and exercise.   Diabetes Patient's last A1c was 7.2 in 2016. Repeat A1c improved to 6.1%. CBGs have increased to the 170s, most likely due to increase in prednisone dose.   Subjective: Patient was seen in bed this AM. Joint pain has improved but still painful in wrists and ankles. Still without appetite. Nephrology informed him of outpatient dialysis chair. Wonders about how to get  to/from dialysis sessions. Agreeable to taking prednisone for joint pain.  Discharge Vitals:   BP 111/84 (BP Location: Right Arm)   Pulse 98   Temp 98.2 F (36.8 C) (Oral)   Resp 18   Ht 5\' 7"  (1.702 m)   Wt 69.1 kg   SpO2 100%   BMI 23.86 kg/m   Discharge exam: General: Pleasant, well-appearing in bed. No acute distress. CV: RRR. No murmurs, rubs, or gallops. No LE edema Pulmonary: Normal effort.  Abdominal: Soft, nontender, nondistended. Extremities: Able to move left wrist, right wrist still tender and pain with movement. Ankles ROM normal. Skin: Warm and dry. No obvious rash or lesions. Neuro: A&Ox3. Normal sensation. No focal deficit. Psych: Normal mood and affect   Pertinent Labs, Studies, and Procedures:     Latest Ref Rng & Units 01/14/2022    5:38 AM 01/13/2022    7:21 AM 01/12/2022    9:04 AM  CBC  WBC 4.0 - 10.5 K/uL 7.9  5.5    Hemoglobin 13.0 - 17.0 g/dL 10.8  10.2  12.6   Hematocrit  39.0 - 52.0 % 37.3  35.8  37.0   Platelets 150 - 400 K/uL 105  86         Latest Ref Rng & Units 01/14/2022    5:38 AM 01/13/2022    3:15 AM 01/12/2022    9:04 AM  CMP  Glucose 70 - 99 mg/dL 106  102  83   BUN 6 - 20 mg/dL 117  110  >130   Creatinine 0.61 - 1.24 mg/dL 9.60  8.85  12.40   Sodium 135 - 145 mmol/L 137  134  136   Potassium 3.5 - 5.1 mmol/L 4.4  4.2  3.9   Chloride 98 - 111 mmol/L 103  104  102   CO2 22 - 32 mmol/L 18  21    Calcium 8.9 - 10.3 mg/dL 8.5  7.7      CT ABDOMEN PELVIS WO CONTRAST  Result Date: 01/09/2022 IMPRESSION: 1. No acute intra-abdominal pathology identified. No definite radiographic explanation for the patient's reported symptoms. 2. Surgical changes of right hemicolectomy and right lower quadrant end ileostomy. Small parastomal hernia containing multiple loops of terminal ileum without evidence of obstruction or focal inflammation. 3. Moderate diverticulosis of the residual transverse and descending colon. Aortic Atherosclerosis  (ICD10-I70.0). Electronically Signed   By: Fidela Salisbury M.D.   On: 01/09/2022 22:22   US RENAL  Result Date: 01/09/2022 IMPRESSION: Increased echogenicity of renal parenchyma is noted suggesting medical renal disease. No hydronephrosis or renal obstruction is noted. Electronically Signed   By: Marijo Conception M.D.   On: 01/09/2022 15:17     Discharge Instructions: Discharge Instructions     Diet - low sodium heart healthy   Complete by: As directed    Increase activity slowly   Complete by: As directed    No wound care   Complete by: As directed          Discharge Instructions      Dear [name], it has been a pleasure caring for you! You were hospitalized for nausea, vomiting, and abdominal pain and treated for progressive kidney failure. Since then you have recovered, you are no longer having those symptoms. We also continued a lot of your home medications to manage your hypertension, heart transplant, and hypertriglyceridemia. In addition to medications, you were also seen by a nephrologist and vascular surgeon.   When you are discharged we would like you to do the following:  1.  Some of your medication regimen has been changed.  Please see the following instructions for the changes: -Take prednisone 60 mg for FOUR more days and decrease to 5 mg moving forward -Take Coreg dose was reduced to 3.125 mg for blood pressure -STOP Hydralazine, calcium carbonate and ferrous sulfate  2. Continue taking the rest of the medications as you were before you came to the hospital.  3. You will be getting dialysis at [facility] on Monday/Tuesday/Thursday/Friday with 12:45 chair time.    Address: [address] [place], [place]  4. Follow-up with your PCP        Signed: Gaylan Gerold, DO 01/14/2022, 1:41 PM

## 2022-01-14 NOTE — Progress Notes (Signed)
[facility]  Subjective  - Wrist feeling much better and feet.   Objective 112/81 89 98.2 F (36.8 C) (Oral) 18 100%  Intake/Output Summary (Last 24 hours) at 01/14/2022 4818 Last data filed at 01/14/2022 0100 Gross per 24 hour  Intake 480 ml  Output 600 ml  Net -120 ml    Left UE warm with palpable radial pulse Less tenderness to palpation at the wrist, minimal left hand motor  Sensation intact left UE Palpable thrill in graft  Assessment/Planning: POD #2 Left UE AV graft placement quickstick   Improved left UE wrist pain Successful left UE AV graft placement tolerating HD F/U as needed with VVS  [name] 01/14/2022 6:42 AM --  Laboratory Lab Results: Recent Labs    01/12/22 0751 01/12/22 0904 01/13/22 0721  WBC 4.3  --  5.5  HGB 10.3* 12.6* 10.2*  HCT 35.8* 37.0* 35.8*  PLT 89*  --  86*   BMET Recent Labs    01/13/22 0315 01/14/22 0538  NA 134* 137  K 4.2 4.4  CL 104 103  CO2 21* 18*  GLUCOSE 102* 106*  BUN 110* 117*  CREATININE 8.85* 9.60*  CALCIUM 7.7* 8.5*    COAG Lab Results  Component Value Date   INR 1.02 08/06/2017   INR 2.10 04/15/2017   INR 2.55 04/07/2017   PROTIME 19.9 09/18/2008   No results found for: "PTT"

## 2022-01-14 NOTE — Plan of Care (Signed)

## 2022-01-14 NOTE — Progress Notes (Signed)
Mobility Specialist Progress Note:   01/14/22 1128  Mobility  Activity Refused mobility   Pt refused mobility d/t feet pains. Will f/u as able.    Dorice Stiggers Mobility Specialist-Acute Rehab Secure Chat only

## 2022-01-14 NOTE — Plan of Care (Signed)

## 2022-01-14 NOTE — Progress Notes (Signed)
New Dialysis Start   Patient identified as new dialysis start. Kidney Education packet assembled and given. Discussed the following items with patient:    Current medications and possible changes once started:  Discussed that patient's medications may change over time.  Ex; hypertension medications and diabetes medication.  Nephrologists will adjust as needed.  Fluid restrictions reviewed:  32 oz daily goal:  All liquids count; soups, ice, jello   Phosphorus and potassium: Handout given showing high potassium and phosphorus foods.  Alternative food and drink options given.  Family support:  no family present at bedside  Outpatient Clinic Resources:  Discussed roles of Outpatient clinic  staff and advised to make a list of needs, if any, to talk with outpatient staff if needed  Care plan schedule: Informed patient of Care Plans in outpatient setting and to participate in the care plan.  An invitation would be given from outpatient clinic.   Dialysis Access Options:  Reviewed access options with patients. Discussed in detail about care at home with new AVG & AVF. Reviewed checking bruit and thrill. If dialysis catheter present, educated that patient could not take showers.  Catheter dressing changes were to be done by outpatient clinic staff only  Home therapy options:  Educated patient about home therapy options:  PD vs home hemo.     Patient verbalized understanding. Will continue to round on patient during admission.    Ingvald Theisen W Wilmot Quevedo, RN   

## 2022-01-14 NOTE — Discharge Instructions (Addendum)
Dear [name], it has been a pleasure caring for you! You were hospitalized for nausea, vomiting, and abdominal pain and treated for progressive kidney failure. Since then you have recovered, you are no longer having those symptoms. We also continued a lot of your home medications to manage your hypertension, heart transplant, and hypertriglyceridemia. In addition to medications, you were also seen by a nephrologist and vascular surgeon.   When you are discharged we would like you to do the following:  1.  Some of your medication regimen has been changed.  Please see the following instructions for the changes: -Take prednisone 60 mg for FOUR more days and decrease to 5 mg moving forward -Take Coreg dose was reduced to 3.125 mg for blood pressure -STOP Hydralazine, calcium carbonate and ferrous sulfate  2. Continue taking the rest of the medications as you were before you came to the hospital.  3. You will be getting dialysis at [facility] on Monday/Tuesday/Thursday/Friday with 12:45 chair time.    Address: [address] [place], [place]  4. Follow-up with your PCP

## 2022-01-14 NOTE — Progress Notes (Signed)
DISCHARGE NOTE HOME CAIRO AGOSTINELLI to be discharged Home per MD order. Discussed prescriptions and follow up appointments with the patient. Prescriptions given to patient; medication list explained in detail. Patient verbalized understanding.  Skin clean, dry and intact without evidence of skin break down, no evidence of skin tears noted. IV catheter discontinued intact. Site without signs and symptoms of complications. Dressing and pressure applied. Pt denies pain at the site currently. No complaints noted.  Patient free of lines, drains, and wounds.   An After Visit Summary (AVS) was printed and given to the patient. Patient escorted via wheelchair, and discharged home via private auto.  Vira Agar, RN

## 2022-01-14 NOTE — Progress Notes (Signed)
Received patient in bed to unit.  Alert and oriented.  Informed consent signed and in chart.   Treatment initiated: 9532 Treatment completed: 1700  Patient tolerated well.  Transported back to the room  Alert, without acute distress.  Hand-off given to patient's nurse.   Access used: Graft Access issues: none  Total UF removed: 1000 Medication(s) given: nione Post HD VS: 98, 102/77(85), HR97, RR23, SP02-100 Post HD weight: 61.5kg   [facility]

## 2022-01-14 NOTE — Progress Notes (Signed)
Subjective:   Joints feel better For HD #2 today Has spot at TCU startign 9/28 Seen in room  Objective Vital signs in last 24 hours: Vitals:   01/13/22 1742 01/14/22 0009 01/14/22 0515 01/14/22 0807  BP: 109/78 113/79 112/81 111/84  Pulse: 90 91 89 98  Resp: 17 18 18 18   Temp: 97.8 F (36.6 C) 98 F (36.7 C) 98.2 F (36.8 C) 98.2 F (36.8 C)  TempSrc: Oral Oral Oral Oral  SpO2: 100% 100% 100% 100%  Weight:   69.1 kg   Height:       Weight change: -0.3 kg  Intake/Output Summary (Last 24 hours) at 01/14/2022 1128 Last data filed at 01/14/2022 0100 Gross per 24 hour  Intake 360 ml  Output 600 ml  Net -240 ml     Assessment/Plan: 63M new ESRD, hx/o heart transplant  1.Renal- New ESRD 2/2 CNIs started [facility] 01/12/22 CLIP in process, to TCU start 01/15/22 S/p LUE Quickstick AVG 01/12/22 HD#2 9/27:  2K, AVG 16g, 250 BFR, 1L UF, No heparin OK to dc thereafter  2. Hypertension/volume  - BPs soft, stopped hydralazine, carvedilol reduced 3. Metabolic acidosis- Stopped NaHCO3 4. N/V-  due to uremia-  symptomatic treatment  5. Anemia  - is not issue right now, stop oral Fe will monitor at HD 5. S/p heart transplant-  continue the prograf, cellcept and pred  6. BMD: hyperphosphatemia, trend with HD for now.   7. Gout flair improvd with pred  Rexene Agent    Labs: Basic Metabolic Panel: Recent Labs  Lab 01/12/22 0751 01/12/22 0904 01/13/22 0315 01/14/22 0538  NA 137 136 134* 137  K 3.9 3.9 4.2 4.4  CL 102 102 104 103  CO2 20*  --  21* 18*  GLUCOSE 82 83 102* 106*  BUN 145* >130* 110* 117*  CREATININE 11.92* 12.40* 8.85* 9.60*  CALCIUM 8.3*  --  7.7* 8.5*  PHOS 7.8*  --  5.7* 7.9*    Liver Function Tests: Recent Labs  Lab 01/09/22 1017 01/10/22 0750 01/12/22 0751 01/13/22 0315 01/14/22 0538  AST 21  --   --   --   --   ALT 6  --   --   --   --   ALKPHOS 152*  --   --   --   --   BILITOT 0.8  --   --   --   --   PROT 7.1  --   --   --   --   ALBUMIN 3.9    < > 3.1* 3.2* 3.2*   < > = values in this interval not displayed.    Recent Labs  Lab 01/09/22 1953  LIPASE 106*    No results for input(s): "AMMONIA" in the last 168 hours. CBC: Recent Labs  Lab 01/09/22 1017 01/11/22 1056 01/12/22 0751 01/12/22 0904 01/13/22 0721 01/14/22 0538  WBC 5.0 5.1 4.3  --  5.5 7.9  NEUTROABS 3.4  --   --   --   --   --   HGB 14.7 10.6* 10.3* 12.6* 10.2* 10.8*  HCT 50.4 35.9* 35.8* 37.0* 35.8* 37.3*  MCV 69.1* 68.3* 69.2*  --  70.2* 69.1*  PLT 118* 94* 89*  --  86* 105*    Cardiac Enzymes: No results for input(s): "CKTOTAL", "CKMB", "CKMBINDEX", "TROPONINI" in the last 168 hours. CBG: Recent Labs  Lab 01/13/22 0806 01/13/22 1108 01/13/22 1612 01/14/22 0114 01/14/22 0809  GLUCAP 103* 109* 153* 176* 117*  Iron Studies: No results for input(s): "IRON", "TIBC", "TRANSFERRIN", "FERRITIN" in the last 72 hours. Studies/Results: No results found. Medications: Infusions:  anticoagulant sodium citrate      Scheduled Medications:  carvedilol  3.125 mg Oral BID   Chlorhexidine Gluconate Cloth  6 each Topical Q0600   citalopram  40 mg Oral Daily   heparin  5,000 Units Subcutaneous Q8H   insulin aspart  0-9 Units Subcutaneous TID WC   mycophenolate  500 mg Oral BID   pantoprazole  40 mg Oral Daily   pravastatin  40 mg Oral Daily   [START ON 01/19/2022] predniSONE  5 mg Oral Q breakfast   predniSONE  60 mg Oral Daily   tacrolimus  2 mg Oral QHS   tacrolimus  3 mg Oral Daily    have reviewed scheduled and prn medications.  Physical Exam: General:  NAD-   f Heart: tachy Lungs: mostly clear Abdomen: soft, non tender Extremities: no edema LUE AVG, +B/T, bandaged   01/14/2022,11:28 AM  LOS: 5 days

## 2022-01-15 ENCOUNTER — Telehealth: Payer: Self-pay | Admitting: Nurse Practitioner

## 2022-01-15 NOTE — Telephone Encounter (Signed)
Transition of care contact from inpatient facility  Date of Discharge: 01/14/2022 Date of Contact: 01/15/2022 Method of contact: Phone  Attempted to contact patient to discuss transition of care from inpatient admission. Patient did not answer the phone. Message on patient's voice mail says mail box is full and cannot accept messages.

## 2022-02-18 DEATH — deceased

## 2022-02-20 ENCOUNTER — Other Ambulatory Visit: Payer: Self-pay
